# Patient Record
Sex: Male | Born: 1950 | Race: Black or African American | Hispanic: No | Marital: Single | Smoking: Former smoker
Health system: Southern US, Community
[De-identification: ages and names within clinical notes are randomized; demographics above are authoritative.]

## PROBLEM LIST (undated history)

## (undated) DIAGNOSIS — J449 Chronic obstructive pulmonary disease, unspecified: Secondary | ICD-10-CM

## (undated) DIAGNOSIS — I10 Essential (primary) hypertension: Secondary | ICD-10-CM

## (undated) DIAGNOSIS — I428 Other cardiomyopathies: Secondary | ICD-10-CM

## (undated) DIAGNOSIS — I639 Cerebral infarction, unspecified: Secondary | ICD-10-CM

## (undated) DIAGNOSIS — M1990 Unspecified osteoarthritis, unspecified site: Secondary | ICD-10-CM

## (undated) DIAGNOSIS — B20 Human immunodeficiency virus [HIV] disease: Secondary | ICD-10-CM

## (undated) DIAGNOSIS — R200 Anesthesia of skin: Secondary | ICD-10-CM

## (undated) DIAGNOSIS — Z974 Presence of external hearing-aid: Secondary | ICD-10-CM

## (undated) DIAGNOSIS — F1021 Alcohol dependence, in remission: Secondary | ICD-10-CM

## (undated) DIAGNOSIS — R05 Cough: Secondary | ICD-10-CM

## (undated) DIAGNOSIS — G510 Bell's palsy: Secondary | ICD-10-CM

## (undated) DIAGNOSIS — N184 Chronic kidney disease, stage 4 (severe): Secondary | ICD-10-CM

## (undated) DIAGNOSIS — E1122 Type 2 diabetes mellitus with diabetic chronic kidney disease: Secondary | ICD-10-CM

## (undated) DIAGNOSIS — I351 Nonrheumatic aortic (valve) insufficiency: Secondary | ICD-10-CM

## (undated) DIAGNOSIS — I472 Ventricular tachycardia: Secondary | ICD-10-CM

## (undated) DIAGNOSIS — Z9181 History of falling: Secondary | ICD-10-CM

## (undated) DIAGNOSIS — E119 Type 2 diabetes mellitus without complications: Secondary | ICD-10-CM

## (undated) DIAGNOSIS — Z95810 Presence of automatic (implantable) cardiac defibrillator: Secondary | ICD-10-CM

## (undated) DIAGNOSIS — I5022 Chronic systolic (congestive) heart failure: Secondary | ICD-10-CM

## (undated) DIAGNOSIS — E785 Hyperlipidemia, unspecified: Secondary | ICD-10-CM

## (undated) DIAGNOSIS — Z9114 Patient's other noncompliance with medication regimen: Secondary | ICD-10-CM

## (undated) DIAGNOSIS — D649 Anemia, unspecified: Secondary | ICD-10-CM

## (undated) DIAGNOSIS — Z91148 Patient's other noncompliance with medication regimen for other reason: Secondary | ICD-10-CM

## (undated) DIAGNOSIS — Z953 Presence of xenogenic heart valve: Secondary | ICD-10-CM

## (undated) DIAGNOSIS — G4733 Obstructive sleep apnea (adult) (pediatric): Secondary | ICD-10-CM

## (undated) DIAGNOSIS — I4729 Other ventricular tachycardia: Secondary | ICD-10-CM

## (undated) HISTORY — DX: Cough: R05

## (undated) HISTORY — DX: History of falling: Z91.81

## (undated) HISTORY — DX: Anesthesia of skin: R20.0

## (undated) HISTORY — PX: CARDIAC SURGERY: SHX584

## (undated) HISTORY — DX: Presence of external hearing-aid: Z97.4

## (undated) HISTORY — DX: Chronic obstructive pulmonary disease, unspecified: J44.9

---
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
## 2012-05-30 DIAGNOSIS — Z974 Presence of external hearing-aid: Secondary | ICD-10-CM

## 2012-05-30 HISTORY — DX: Presence of external hearing-aid: Z97.4

## 2012-11-12 HISTORY — PX: CARDIAC DEFIBRILLATOR PLACEMENT: SHX171

## 2013-01-28 DIAGNOSIS — I639 Cerebral infarction, unspecified: Secondary | ICD-10-CM

## 2013-01-28 HISTORY — DX: Cerebral infarction, unspecified: I63.9

## 2013-06-07 LAB — PROTIME-INR

## 2013-08-28 DIAGNOSIS — R058 Other specified cough: Secondary | ICD-10-CM

## 2013-08-28 HISTORY — DX: Other specified cough: R05.8

## 2013-10-23 ENCOUNTER — Inpatient Hospital Stay (HOSPITAL_COMMUNITY)
Admission: EM | Admit: 2013-10-23 | Discharge: 2013-10-28 | DRG: 292 | Disposition: A | Discharge status: Nursing Home (Includes ICF) | Payer: Medicare HMO | Attending: Pulmonary Disease | Admitting: Pulmonary Disease

## 2013-10-23 ENCOUNTER — Encounter (HOSPITAL_COMMUNITY): Payer: Self-pay | Admitting: Emergency Medicine

## 2013-10-23 ENCOUNTER — Emergency Department (HOSPITAL_COMMUNITY): Payer: Medicare HMO

## 2013-10-23 DIAGNOSIS — Z952 Presence of prosthetic heart valve: Secondary | ICD-10-CM

## 2013-10-23 DIAGNOSIS — I5023 Acute on chronic systolic (congestive) heart failure: Principal | ICD-10-CM

## 2013-10-23 DIAGNOSIS — E785 Hyperlipidemia, unspecified: Secondary | ICD-10-CM | POA: Diagnosis present

## 2013-10-23 DIAGNOSIS — Z9119 Patient's noncompliance with other medical treatment and regimen: Secondary | ICD-10-CM

## 2013-10-23 DIAGNOSIS — T50995A Adverse effect of other drugs, medicaments and biological substances, initial encounter: Secondary | ICD-10-CM | POA: Diagnosis present

## 2013-10-23 DIAGNOSIS — J449 Chronic obstructive pulmonary disease, unspecified: Secondary | ICD-10-CM | POA: Diagnosis present

## 2013-10-23 DIAGNOSIS — D509 Iron deficiency anemia, unspecified: Secondary | ICD-10-CM | POA: Diagnosis present

## 2013-10-23 DIAGNOSIS — Z87891 Personal history of nicotine dependence: Secondary | ICD-10-CM

## 2013-10-23 DIAGNOSIS — I10 Essential (primary) hypertension: Secondary | ICD-10-CM | POA: Diagnosis present

## 2013-10-23 DIAGNOSIS — D72819 Decreased white blood cell count, unspecified: Secondary | ICD-10-CM | POA: Diagnosis present

## 2013-10-23 DIAGNOSIS — I2510 Atherosclerotic heart disease of native coronary artery without angina pectoris: Secondary | ICD-10-CM | POA: Diagnosis present

## 2013-10-23 DIAGNOSIS — N179 Acute kidney failure, unspecified: Secondary | ICD-10-CM | POA: Diagnosis present

## 2013-10-23 DIAGNOSIS — Z95810 Presence of automatic (implantable) cardiac defibrillator: Secondary | ICD-10-CM

## 2013-10-23 DIAGNOSIS — I129 Hypertensive chronic kidney disease with stage 1 through stage 4 chronic kidney disease, or unspecified chronic kidney disease: Secondary | ICD-10-CM | POA: Diagnosis present

## 2013-10-23 DIAGNOSIS — N039 Chronic nephritic syndrome with unspecified morphologic changes: Secondary | ICD-10-CM

## 2013-10-23 DIAGNOSIS — I428 Other cardiomyopathies: Secondary | ICD-10-CM | POA: Diagnosis present

## 2013-10-23 DIAGNOSIS — Z91199 Patient's noncompliance with other medical treatment and regimen due to unspecified reason: Secondary | ICD-10-CM

## 2013-10-23 DIAGNOSIS — I509 Heart failure, unspecified: Secondary | ICD-10-CM | POA: Diagnosis present

## 2013-10-23 DIAGNOSIS — Z7982 Long term (current) use of aspirin: Secondary | ICD-10-CM

## 2013-10-23 DIAGNOSIS — Z8673 Personal history of transient ischemic attack (TIA), and cerebral infarction without residual deficits: Secondary | ICD-10-CM

## 2013-10-23 DIAGNOSIS — J4489 Other specified chronic obstructive pulmonary disease: Secondary | ICD-10-CM | POA: Diagnosis present

## 2013-10-23 DIAGNOSIS — I4891 Unspecified atrial fibrillation: Secondary | ICD-10-CM | POA: Diagnosis present

## 2013-10-23 DIAGNOSIS — E875 Hyperkalemia: Secondary | ICD-10-CM | POA: Diagnosis present

## 2013-10-23 DIAGNOSIS — N183 Chronic kidney disease, stage 3 unspecified: Secondary | ICD-10-CM

## 2013-10-23 DIAGNOSIS — Z7902 Long term (current) use of antithrombotics/antiplatelets: Secondary | ICD-10-CM

## 2013-10-23 DIAGNOSIS — Z79899 Other long term (current) drug therapy: Secondary | ICD-10-CM

## 2013-10-23 DIAGNOSIS — R05 Cough: Secondary | ICD-10-CM

## 2013-10-23 DIAGNOSIS — E119 Type 2 diabetes mellitus without complications: Secondary | ICD-10-CM | POA: Diagnosis present

## 2013-10-23 DIAGNOSIS — N189 Chronic kidney disease, unspecified: Secondary | ICD-10-CM

## 2013-10-23 DIAGNOSIS — R059 Cough, unspecified: Secondary | ICD-10-CM | POA: Diagnosis present

## 2013-10-23 DIAGNOSIS — D631 Anemia in chronic kidney disease: Secondary | ICD-10-CM | POA: Diagnosis present

## 2013-10-23 DIAGNOSIS — F1021 Alcohol dependence, in remission: Secondary | ICD-10-CM | POA: Diagnosis present

## 2013-10-23 HISTORY — DX: Chronic systolic (congestive) heart failure: I50.22

## 2013-10-23 HISTORY — DX: Patient's other noncompliance with medication regimen for other reason: Z91.148

## 2013-10-23 HISTORY — DX: Cerebral infarction, unspecified: I63.9

## 2013-10-23 HISTORY — DX: Essential (primary) hypertension: I10

## 2013-10-23 HISTORY — DX: Ventricular tachycardia: I47.2

## 2013-10-23 HISTORY — DX: Anemia, unspecified: D64.9

## 2013-10-23 HISTORY — DX: Nonrheumatic aortic (valve) insufficiency: I35.1

## 2013-10-23 HISTORY — DX: Alcohol dependence, in remission: F10.21

## 2013-10-23 HISTORY — DX: Type 2 diabetes mellitus without complications: E11.9

## 2013-10-23 HISTORY — DX: Bell's palsy: G51.0

## 2013-10-23 HISTORY — DX: Hyperlipidemia, unspecified: E78.5

## 2013-10-23 HISTORY — DX: Other ventricular tachycardia: I47.29

## 2013-10-23 HISTORY — DX: Patient's other noncompliance with medication regimen: Z91.14

## 2013-10-23 LAB — GLUCOSE, CAPILLARY: GLUCOSE-CAPILLARY: 115 mg/dL — AB (ref 70–99)

## 2013-10-23 LAB — BASIC METABOLIC PANEL
BUN: 60 mg/dL — ABNORMAL HIGH (ref 6–23)
CO2: 21 mEq/L (ref 19–32)
Calcium: 9 mg/dL (ref 8.4–10.5)
Chloride: 105 mEq/L (ref 96–112)
Creatinine, Ser: 1.89 mg/dL — ABNORMAL HIGH (ref 0.50–1.35)
GFR calc Af Amer: 43 mL/min — ABNORMAL LOW (ref >90)
GFR, EST NON AFRICAN AMERICAN: 37 mL/min — AB (ref >90)
Glucose, Bld: 127 mg/dL — ABNORMAL HIGH (ref 70–99)
Potassium: 5.6 mEq/L — ABNORMAL HIGH (ref 3.7–5.3)
SODIUM: 137 meq/L (ref 137–147)

## 2013-10-23 LAB — URINALYSIS, ROUTINE W REFLEX MICROSCOPIC
BILIRUBIN URINE: NEGATIVE
Glucose, UA: NEGATIVE mg/dL
Ketones, ur: NEGATIVE mg/dL
Leukocytes, UA: NEGATIVE
Nitrite: NEGATIVE
Protein, ur: 100 mg/dL — AB
SPECIFIC GRAVITY, URINE: 1.025 (ref 1.005–1.030)
UROBILINOGEN UA: 0.2 mg/dL (ref 0.0–1.0)
pH: 5.5 (ref 5.0–8.0)

## 2013-10-23 LAB — HEPATIC FUNCTION PANEL
ALK PHOS: 265 U/L — AB (ref 39–117)
ALT: 46 U/L (ref 0–53)
AST: 49 U/L — ABNORMAL HIGH (ref 0–37)
Albumin: 2.5 g/dL — ABNORMAL LOW (ref 3.5–5.2)
BILIRUBIN INDIRECT: 0.5 mg/dL (ref 0.3–0.9)
Bilirubin, Direct: 0.5 mg/dL — ABNORMAL HIGH (ref 0.0–0.3)
Total Bilirubin: 1 mg/dL (ref 0.3–1.2)
Total Protein: 8.6 g/dL — ABNORMAL HIGH (ref 6.0–8.3)

## 2013-10-23 LAB — CBC
HCT: 29.8 % — ABNORMAL LOW (ref 39.0–52.0)
HEMOGLOBIN: 9.2 g/dL — AB (ref 13.0–17.0)
MCH: 27.1 pg (ref 26.0–34.0)
MCHC: 30.9 g/dL (ref 30.0–36.0)
MCV: 87.9 fL (ref 78.0–100.0)
Platelets: 264 10*3/uL (ref 150–400)
RBC: 3.39 MIL/uL — ABNORMAL LOW (ref 4.22–5.81)
RDW: 16.7 % — ABNORMAL HIGH (ref 11.5–15.5)
WBC: 2.6 10*3/uL — ABNORMAL LOW (ref 4.0–10.5)

## 2013-10-23 LAB — URINE MICROSCOPIC-ADD ON

## 2013-10-23 LAB — PRO B NATRIURETIC PEPTIDE: Pro B Natriuretic peptide (BNP): 42136 pg/mL — ABNORMAL HIGH (ref 0–125)

## 2013-10-23 LAB — CBG MONITORING, ED: GLUCOSE-CAPILLARY: 87 mg/dL (ref 70–99)

## 2013-10-23 LAB — TROPONIN I

## 2013-10-23 MED ORDER — TRAMADOL HCL 50 MG PO TABS: 50.0000 mg | ORAL_TABLET | Freq: Four times a day (QID) | ORAL | Status: DC | PRN

## 2013-10-23 MED ORDER — ONDANSETRON HCL 4 MG/2ML IJ SOLN: 4.0000 mg | Freq: Four times a day (QID) | INTRAMUSCULAR | Status: DC | PRN

## 2013-10-23 MED ORDER — FUROSEMIDE 10 MG/ML IJ SOLN
40.0000 mg | Freq: Once | INTRAMUSCULAR | Status: AC
  Administered 2013-10-23: 40 mg via INTRAVENOUS
  Filled 2013-10-23: qty 4

## 2013-10-23 MED ORDER — PANTOPRAZOLE SODIUM 40 MG PO TBEC
40.0000 mg | DELAYED_RELEASE_TABLET | Freq: Every day | ORAL | Status: DC
  Administered 2013-10-23 – 2013-10-28 (×6): 40 mg via ORAL
  Filled 2013-10-23 (×6): qty 1

## 2013-10-23 MED ORDER — ATORVASTATIN CALCIUM 40 MG PO TABS
80.0000 mg | ORAL_TABLET | Freq: Every day | ORAL | Status: DC
  Administered 2013-10-24 – 2013-10-28 (×5): 80 mg via ORAL
  Filled 2013-10-23 (×5): qty 2

## 2013-10-23 MED ORDER — ACETAMINOPHEN 325 MG PO TABS: 650.0000 mg | ORAL_TABLET | Freq: Four times a day (QID) | ORAL | Status: DC | PRN

## 2013-10-23 MED ORDER — FERROUS SULFATE 325 (65 FE) MG PO TABS
325.0000 mg | ORAL_TABLET | Freq: Every day | ORAL | Status: DC
  Administered 2013-10-24 – 2013-10-26 (×3): 325 mg via ORAL
  Filled 2013-10-23 (×3): qty 1

## 2013-10-23 MED ORDER — CLOPIDOGREL BISULFATE 75 MG PO TABS
75.0000 mg | ORAL_TABLET | Freq: Every day | ORAL | Status: DC
  Administered 2013-10-24 – 2013-10-28 (×5): 75 mg via ORAL
  Filled 2013-10-23 (×5): qty 1

## 2013-10-23 MED ORDER — INSULIN ASPART 100 UNIT/ML ~~LOC~~ SOLN
0.0000 [IU] | Freq: Every day | SUBCUTANEOUS | Status: DC
Start: 2013-10-23 — End: 2013-10-28

## 2013-10-23 MED ORDER — ASPIRIN EC 325 MG PO TBEC
325.0000 mg | DELAYED_RELEASE_TABLET | Freq: Every day | ORAL | Status: DC
  Administered 2013-10-24 – 2013-10-28 (×5): 325 mg via ORAL
  Filled 2013-10-23 (×5): qty 1

## 2013-10-23 MED ORDER — GUAIFENESIN-CODEINE 100-10 MG/5ML PO SOLN
5.0000 mL | ORAL | Status: DC | PRN
  Administered 2013-10-23 – 2013-10-24 (×3): 5 mL via ORAL
  Filled 2013-10-23 (×3): qty 5

## 2013-10-23 MED ORDER — CLONIDINE HCL 0.2 MG/24HR TD PTWK
0.2000 mg | MEDICATED_PATCH | TRANSDERMAL | Status: DC
  Filled 2013-10-23: qty 1

## 2013-10-23 MED ORDER — ONDANSETRON HCL 4 MG PO TABS: 4.0000 mg | ORAL_TABLET | Freq: Four times a day (QID) | ORAL | Status: DC | PRN

## 2013-10-23 MED ORDER — INSULIN ASPART 100 UNIT/ML ~~LOC~~ SOLN
0.0000 [IU] | Freq: Three times a day (TID) | SUBCUTANEOUS | Status: DC
Start: 2013-10-24 — End: 2013-10-28
  Administered 2013-10-25 (×2): 3 [IU] via SUBCUTANEOUS
  Administered 2013-10-26: 2 [IU] via SUBCUTANEOUS
  Administered 2013-10-26 – 2013-10-27 (×3): 3 [IU] via SUBCUTANEOUS

## 2013-10-23 MED ORDER — LEVOFLOXACIN 500 MG PO TABS
500.0000 mg | ORAL_TABLET | ORAL | Status: DC
  Administered 2013-10-23 – 2013-10-27 (×3): 500 mg via ORAL
  Filled 2013-10-23 (×3): qty 1

## 2013-10-23 MED ORDER — CARVEDILOL 12.5 MG PO TABS
25.0000 mg | ORAL_TABLET | Freq: Two times a day (BID) | ORAL | Status: DC
  Administered 2013-10-24 – 2013-10-28 (×9): 25 mg via ORAL
  Filled 2013-10-23 (×9): qty 2

## 2013-10-23 MED ORDER — LEVOFLOXACIN 500 MG PO TABS: 500.0000 mg | ORAL_TABLET | ORAL | Status: DC

## 2013-10-23 MED ORDER — HEPARIN SODIUM (PORCINE) 5000 UNIT/ML IJ SOLN
5000.0000 [IU] | Freq: Three times a day (TID) | INTRAMUSCULAR | Status: DC
  Administered 2013-10-23 – 2013-10-28 (×14): 5000 [IU] via SUBCUTANEOUS
  Filled 2013-10-23 (×14): qty 1

## 2013-10-23 MED ORDER — FUROSEMIDE 80 MG PO TABS
80.0000 mg | ORAL_TABLET | Freq: Two times a day (BID) | ORAL | Status: DC
  Administered 2013-10-24: 80 mg via ORAL
  Filled 2013-10-23: qty 1

## 2013-10-23 MED ORDER — ACETAMINOPHEN 650 MG RE SUPP: 650.0000 mg | Freq: Four times a day (QID) | RECTAL | Status: DC | PRN

## 2013-10-23 MED ORDER — NITROGLYCERIN 2 % TD OINT
1.0000 [in_us] | TOPICAL_OINTMENT | Freq: Once | TRANSDERMAL | Status: AC
  Administered 2013-10-23: 1 [in_us] via TOPICAL
  Filled 2013-10-23: qty 1

## 2013-10-23 MED ORDER — PNEUMOCOCCAL VAC POLYVALENT 25 MCG/0.5ML IJ INJ
0.5000 mL | INJECTION | INTRAMUSCULAR | Status: DC
  Filled 2013-10-23: qty 0.5

## 2013-10-23 NOTE — H&P (Addendum)
[facility] History and Physical  Inez Sayles [id number] DOB: Jun 15, 1950 DOA: 10/23/2013  Referring physician: Dr. Roderic Palau PCP: Dr Luan Pulling  Chief Complaint:  Persistent cough for 3 days  HPI:  63 year old male with history of CHF with EF of 20-25% with NYHA class III dyspnea, status post AICD, hypertension, type 2 diabetes mellitus, CAD, CKD with baseline creatinine of 1.6-1.8,  history of CVA with residual mild weakness ,who was recently hospitalized at [facility] in [place] for acute on chronic systolic CHF presented to the ED with persistent dry cough for past 3 days. Patient previously lived in [place] and after being discharged from the hospital 10 days back to [place]  to live with his family. Patient during recent hospitalization was treated with IV Lasix for acute CHF. He apparently also had elevated d-dimer and had VQ scan done which I assume was negative. Patient was discharged on full dose of aspirin and increased her dose of Lasix. He reports having established care with Dr. Luan Pulling as his primary care (has not had outpatient visit yet) and we cardiology at [facility]. He also reports establishing care with Dr. Hinda Lenis for his kidney disease . Patient denies any phlegm or hemoptysis. He denies any sick contacts. On his discharge medication list from the hospital he was prescribed a course of azithromycin as well. He denies any worsening shortness of breath since his recent hospitalization. He reports that his leg swelling have however reduced . At baseline he has in light AHA class III dyspnea and has 2 pillow  orthopnea. Patient denies headache, dizziness, fever, chills, nausea , vomiting, chest pain, palpitations, SOB, abdominal pain, bowel or urinary symptoms. Denies change in weight or appetite.  Course in the ED Patient's vitals were stable. Blood work done for WBC of 2.6, hemoglobin of 9.2, hematocrit 29.8. Chemistry showed sodium of 137, potassium of 5.6, BUN  of 16 creatinine of 1.89. Troponin was negative. Chest x-ray was negative for infiltrate or pulmonary edema. ProBNP was elevated to 42,000. Patient given one dose of IV Lasix and admitted on observation with concern for CHF.  Review of Systems:  Constitutional: Denies fever, chills, diaphoresis, appetite change and fatigue.  HEENT: Dry Cough and throat congestion, Denies photophobia, eye pain, redness, hearing loss, ear pain, sore throat, rhinorrhea, sneezing, mouth sores, trouble swallowing, neck pain,   Respiratory: Dyspnea on exertion and cough , denies chest tightness,  and wheezing.   Cardiovascular: Denies chest pain, palpitations, leg swelling.  Gastrointestinal: Denies nausea, vomiting, abdominal pain, diarrhea, constipation, blood in stool and abdominal distention.  Genitourinary: Denies dysuria, urgency, frequency, hematuria, flank pain and difficulty urinating.  Endocrine: Denies: hot or cold intolerance,  polyuria, polydipsia. Musculoskeletal: Denies myalgias, back pain, joint swelling, arthralgias and gait problem.  Skin: Denies pallor, rash and wound.  Neurological: Denies dizziness, seizures, syncope, weakness, light-headedness, numbness and headaches.     Past Medical History  Diagnosis Date  . CHF (congestive heart failure)   . Diabetes mellitus without complication   . Coronary artery disease   . Hypertension   . Renal disorder   . Anemia   . Hypokalemia   . Hyperlipidemia   . Stroke     residual facial droop  . Bell's palsy   . Cardiomyopathy   . Aortic insufficiency   . Alcoholism in remission   . Noncompliance with medication regimen   . NSVT (nonsustained ventricular tachycardia)   . Hypoglycemia   . Arrhythmia   . Cardiac defibrillator in situ   . Acute  kidney injury    Past Surgical History  Procedure Laterality Date  . Pacemaker insertion    . Cardiac surgery     Social History:  reports that he has quit smoking. He does not have any smokeless  tobacco history on file. He reports that he does not drink alcohol or use illicit drugs.  No Known Allergies  No family history on file.  Prior to Admission medications   Medication Sig Start Date End Date Taking? Authorizing Provider  aspirin EC 325 MG tablet Take 325 mg by mouth daily.   Yes Historical Provider, MD  atorvastatin (LIPITOR) 80 MG tablet Take 80 mg by mouth daily.   Yes Historical Provider, MD  carvedilol (COREG) 25 MG tablet Take 25 mg by mouth 2 (two) times daily with a meal.   Yes Historical Provider, MD  cloNIDine (CATAPRES - DOSED IN MG/24 HR) 0.2 mg/24hr patch Place 0.2 mg onto the skin once a week.   Yes Historical Provider, MD  clopidogrel (PLAVIX) 75 MG tablet Take 75 mg by mouth daily with breakfast.   Yes Historical Provider, MD  ferrous sulfate 325 (65 FE) MG tablet Take 325 mg by mouth daily with breakfast.   Yes Historical Provider, MD  furosemide (LASIX) 80 MG tablet Take 80 mg by mouth 2 (two) times daily.   Yes Historical Provider, MD  insulin lispro (HUMALOG) 100 UNIT/ML injection Inject 2-6 Units into the skin 3 (three) times daily with meals.   Yes Historical Provider, MD  lisinopril (PRINIVIL,ZESTRIL) 20 MG tablet Take 20 mg by mouth daily.   Yes Historical Provider, MD  spironolactone (ALDACTONE) 25 MG tablet Take 25 mg by mouth daily.   Yes Historical Provider, MD  traMADol (ULTRAM) 50 MG tablet Take 50 mg by mouth every 6 (six) hours as needed for moderate pain.   Yes Historical Provider, MD     Physical Exam:  Filed Vitals:   10/23/13 1805 10/23/13 1828 10/23/13 1843 10/23/13 1900  BP:  138/89 131/83   Pulse: 70 62 72   Temp:   98.2 F (36.8 C)   TempSrc:   Oral   Resp: 23     Height:   5\' 2"  (1.575 m)   Weight:    63.277 kg (139 lb 8 oz)  SpO2: 100% 96% 98%     Constitutional: Vital signs reviewed.  Patient is a well-developed and well-nourished in no acute distress. HEENT: no pallor, no icterus, moist oral mucosa, left facial  droop Cardiovascular: RRR, S1 normal, S2 normal, no MRG Chest: CTAB, no wheezes, rales, or rhonchi Abdominal: Soft. Non-tender, non-distended, bowel sounds are normal, no masses, organomegaly, or guarding present.  Ext: warm, trace edema Neurological: A&O x3, non focal  Labs on Admission:  Basic Metabolic Panel:  Recent Labs Lab 10/23/13 1502  NA 137  K 5.6*  CL 105  CO2 21  GLUCOSE 127*  BUN 60*  CREATININE 1.89*  CALCIUM 9.0   Liver Function Tests:  Recent Labs Lab 10/23/13 1502  AST 49*  ALT 46  ALKPHOS 265*  BILITOT 1.0  PROT 8.6*  ALBUMIN 2.5*   No results found for this basename: LIPASE, AMYLASE,  in the last 168 hours No results found for this basename: AMMONIA,  in the last 168 hours CBC:  Recent Labs Lab 10/23/13 1502  WBC 2.6*  HGB 9.2*  HCT 29.8*  MCV 87.9  PLT 264   Cardiac Enzymes:  Recent Labs Lab 10/23/13 1502  TROPONINI <0.30  BNP: No components found with this basename: POCBNP,  CBG:  Recent Labs Lab 10/23/13 1300  GLUCAP 87    Radiological Exams on Admission: Dg Chest 2 View  10/23/2013   CLINICAL DATA:  Cough for 2 months. Resident of the adult family care home.  EXAM: CHEST  2 VIEW  COMPARISON:  None.  FINDINGS: Left subclavian pacemaker lead is present at the right ventricular apex. The heart is mildly enlarged status post median sternotomy. The lungs demonstrate mild interstitial prominence and central airway thickening. There is no confluent airspace opacity or definite edema. There is mild fissural thickening without pleural effusion.  IMPRESSION: The cardiomegaly with interstitial prominence and central airway thickening. No overt pulmonary edema or confluent airspace opacity.   Electronically Signed   By: Camie Patience M.D.   On: 10/23/2013 13:42    EKG:   Assessment/Plan Principal Problem:   Cough Possibly acute bronchitis vs secondary to ACEi Will place on empiric Levaquin. Hold lisinopril. Place on guaifensin and  codein prn. Add albuterol inhaler prn.  possible underlying GERD. Add PPI. - place on empiric Levaquin with concern for bronchitis.  Active Problems:   CHF (congestive heart failure), NYHA class III Appears to be stable. No signs of acute failure. Patient may have chronically elevated proBNP. Was 55,000 when he was admitted 2 weeks back for acute CHF. Continue home dose Lasix, aspirin, and carvedilol. Hold ACE inhibitor and Aldactone given hyperkalemia.    CKD (chronic kidney disease) Baseline creatinine appears to be about 1.6 and mildly deviated possibly from increased dose of Lasix. Continue to monitor    Type II or unspecified type diabetes mellitus , controlled A1c of 8.1, 2 months back. Patient on pre-meal aspart. We'll place on sliding scale insulin    Essential hypertension, benign Continue home medications. Will hold lisinopril and Aldactone    Hyperkalemia Hold lisinopril and Aldactone. Hold potassium supplement .No EKG changes. Monitor in a.m.    CAD (coronary artery disease) Continue  aspirin, statin and carvedilol. Patient has followup with cardiology he needed repeated    History of stroke Has mild residual weakness. Has left facial likely due to chronic bell's palsy. Continue Plavix and a statin    Anemia of chronic kidney failure Appears to be stable. Monitor. Noted for low WBC as well. Monitor labs in a.m. Continue iron sulfate   Diabetes mellitus  monitor fsg. Place on SSI  Diet:cardiac / diabetic  DVT prophylaxis: sq heparin   Code Status: full code Family Communication: discussed with brother in law None at bedside Disposition Plan: home once improved  Abbie Jablon [facility] Pager [phone]  Total time spent on admission :50 minutes  If 7PM-7AM, please contact night-coverage [email or web address] Password [facility] 10/23/2013, 7:38 PM

## 2013-10-23 NOTE — ED Notes (Signed)
Pt eating some peanutbutter crackers to prevent blood sugar from dropping.

## 2013-10-23 NOTE — ED Notes (Signed)
Pt reports cough x 2 months. Pt resident of an adult family care home.  Reports has been with [facility] x 1 week.   Unknown if has had fever.

## 2013-10-23 NOTE — ED Notes (Signed)
Pt in restroom 

## 2013-10-23 NOTE — ED Provider Notes (Signed)
CSN: [id number]     Arrival date & time 10/23/13  1230 History   First MD Initiated Contact with Patient 10/23/13 1417     Chief Complaint  Patient presents with  . Cough     (Consider location/radiation/quality/duration/timing/severity/associated sxs/prior Treatment) HPI Comments: Keith Young is a 63 -year-old male who presents to the emergency department with complaint of cough. The patient is a resident of a local rest home facility for the past 10 days. The patient was recently discharged from the [facility] in [place]. The caregiver for the adult family care on home states that the patient has been having problems with cough for approximately 2 months, but worse in the past 3-4 days. He cannot lie down flat because of problems with cough and difficulty with breathing. It is of note that while at the [facility] the patient was noted to have acute on chronic combined systolic and diastolic congestive heart failure, diabetes mellitus type 2, hypertension, coronary artery disease, chronic kidney disease stage III, hypokalemia, and facial droop do to an older stroke. The patient's caregiver states that in the past the patient has had problems with alcoholism and possibly substance abuse. The family care home has not been doing daily weights and so we do not have that information available at this time. The patient denies any unusual chest pain, and there's been no loss of consciousness reported. The caregiver was concerned for the constant cough. The patient is being a stab with a primary care physician, but he cannot see this physician until July. He is being established with a cardiologist which he is scheduled to see in 2 days. Patient is also being established with a nephrology specialist.  Patient is a 63 y.o. male presenting with cough. The history is provided by the patient.  Cough Associated symptoms: shortness of breath   Associated symptoms: no chest  pain, no eye discharge and no wheezing     Past Medical History  Diagnosis Date  . CHF (congestive heart failure)   . Diabetes mellitus without complication   . Coronary artery disease   . Hypertension   . Renal disorder   . Anemia   . Hypokalemia   . Hyperlipidemia   . Stroke     residual facial droop  . Bell's palsy   . Cardiomyopathy   . Aortic insufficiency   . Alcoholism in remission   . Noncompliance with medication regimen   . NSVT (nonsustained ventricular tachycardia)   . Hypoglycemia   . Arrhythmia   . Cardiac defibrillator in situ   . Acute kidney injury    Past Surgical History  Procedure Laterality Date  . Pacemaker insertion    . Cardiac surgery     No family history on file. History  Substance Use Topics  . Smoking status: Former Research scientist (life sciences)  . Smokeless tobacco: Not on file  . Alcohol Use: Not on file     Comment: former    Review of Systems  Constitutional: Negative for activity change.       All ROS Neg except as noted in HPI  HENT:       Difficulty hearing  Eyes: Negative for photophobia and discharge.  Respiratory: Positive for cough and shortness of breath. Negative for wheezing.   Cardiovascular: Positive for palpitations and leg swelling. Negative for chest pain.  Gastrointestinal: Negative for abdominal pain and blood in stool.  Genitourinary: Negative for dysuria, frequency and hematuria.  Musculoskeletal: Positive for arthralgias. Negative for back  pain and neck pain.  Skin: Negative.   Allergic/Immunologic: Negative.   Neurological: Positive for weakness. Negative for dizziness, seizures and speech difficulty.  Hematological: Negative.       Allergies  Review of patient's allergies indicates no known allergies.  Home Medications   Prior to Admission medications   Medication Sig Start Date End Date Taking? Authorizing Provider  aspirin EC 325 MG tablet Take 325 mg by mouth daily.   Yes Historical Provider, MD  atorvastatin  (LIPITOR) 80 MG tablet Take 80 mg by mouth daily.   Yes Historical Provider, MD  carvedilol (COREG) 25 MG tablet Take 25 mg by mouth 2 (two) times daily with a meal.   Yes Historical Provider, MD  cloNIDine (CATAPRES - DOSED IN MG/24 HR) 0.2 mg/24hr patch Place 0.2 mg onto the skin once a week.   Yes Historical Provider, MD  clopidogrel (PLAVIX) 75 MG tablet Take 75 mg by mouth daily with breakfast.   Yes Historical Provider, MD  ferrous sulfate 325 (65 FE) MG tablet Take 325 mg by mouth daily with breakfast.   Yes Historical Provider, MD  furosemide (LASIX) 80 MG tablet Take 80 mg by mouth 2 (two) times daily.   Yes Historical Provider, MD  insulin lispro (HUMALOG) 100 UNIT/ML injection Inject 2-6 Units into the skin 3 (three) times daily with meals.   Yes Historical Provider, MD  lisinopril (PRINIVIL,ZESTRIL) 20 MG tablet Take 20 mg by mouth daily.   Yes Historical Provider, MD  spironolactone (ALDACTONE) 25 MG tablet Take 25 mg by mouth daily.   Yes Historical Provider, MD  traMADol (ULTRAM) 50 MG tablet Take 50 mg by mouth every 6 (six) hours as needed for moderate pain.   Yes Historical Provider, MD   BP 119/71  Pulse 63  Temp(Src) 97.4 F (36.3 C) (Oral)  Resp 18  SpO2 99% Physical Exam  Nursing note and vitals reviewed. Constitutional: He is oriented to person, place, and time. He appears well-developed and well-nourished.  Non-toxic appearance.  HENT:  Head: Normocephalic.  Right Ear: Tympanic membrane normal.  Left Ear: Tympanic membrane normal.  Mouth/Throat: Oropharynx is clear and moist.  Hearing aids in place  Eyes: EOM and lids are normal. Pupils are equal, round, and reactive to light.  Neck: Normal range of motion. Neck supple. Carotid bruit is not present. No tracheal deviation present.  Cardiovascular: Normal rate, regular rhythm, intact distal pulses and normal pulses.   Murmur heard. Pulmonary/Chest: No respiratory distress.  A course sounds present with a few  scattered rhonchi.  Abdominal: Soft. Bowel sounds are normal. There is no tenderness. There is no guarding.  Hepatomegaly.  Musculoskeletal: Normal range of motion. He exhibits edema.  2+ pitting edema from the ankle to the anterior tibial tuberosity.  Lymphadenopathy:       Head (right side): No submandibular adenopathy present.       Head (left side): No submandibular adenopathy present.    He has no cervical adenopathy.  Neurological: He is alert and oriented to person, place, and time. He has normal strength. No cranial nerve deficit or sensory deficit.  Skin: Skin is warm and dry.  Psychiatric: He has a normal mood and affect. His speech is normal.    ED Course  Procedures (including critical care time) Labs Review Labs Reviewed  CBG MONITORING, ED  CBG MONITORING, ED    Imaging Review Dg Chest 2 View  10/23/2013   CLINICAL DATA:  Cough for 2 months. Resident of the  adult family care home.  EXAM: CHEST  2 VIEW  COMPARISON:  None.  FINDINGS: Left subclavian pacemaker lead is present at the right ventricular apex. The heart is mildly enlarged status post median sternotomy. The lungs demonstrate mild interstitial prominence and central airway thickening. There is no confluent airspace opacity or definite edema. There is mild fissural thickening without pleural effusion.  IMPRESSION: The cardiomegaly with interstitial prominence and central airway thickening. No overt pulmonary edema or confluent airspace opacity.   Electronically Signed   By: Camie Patience M.D.   On: 10/23/2013 13:42     EKG Interpretation None      MDM Patient seen with me by Dr. Hunt Oris.  Urinalysis shows a clear yellow specimen with a specific gravity of 1.025. The patient has 100 mg per decaliter of protein. Otherwise the urinalysis is within normal limits. A complete blood count shows a low white blood cell count of 2.6, the hemoglobin and hematocrit are low at 9.2 and 29.8 respectively. The basic metabolic  panel shows the potassium to be elevated at 5.6, glucose 127 elevated, the BUN elevated at 60, and a creatinine elevated at 1.89. The glomerular filtration rate is low at 43. Electrocardiogram was negative for acute event. The chest x-ray shows cardiomegaly with interstitial prominence and central airway thickening, no overt pulmonary edema. The B. natruretic peptide is elevated at 42,136, the troponin is within normal limits at less than 0.30.  Call placed to the hospitalist for admission due to to congestive heart failure and chronic kidney disease stage III. Pt to be admitted.   Final diagnoses:  None    *I have reviewed nursing notes, vital signs, and all appropriate lab and imaging results for this patient.Keith Ahr, PA-C 10/23/13 1719

## 2013-10-24 ENCOUNTER — Encounter (HOSPITAL_COMMUNITY): Payer: Self-pay | Admitting: Cardiology

## 2013-10-24 DIAGNOSIS — I369 Nonrheumatic tricuspid valve disorder, unspecified: Secondary | ICD-10-CM

## 2013-10-24 DIAGNOSIS — I5023 Acute on chronic systolic (congestive) heart failure: Principal | ICD-10-CM

## 2013-10-24 DIAGNOSIS — I2510 Atherosclerotic heart disease of native coronary artery without angina pectoris: Secondary | ICD-10-CM

## 2013-10-24 LAB — BASIC METABOLIC PANEL
BUN: 64 mg/dL — ABNORMAL HIGH (ref 6–23)
CO2: 20 mEq/L (ref 19–32)
Calcium: 8.7 mg/dL (ref 8.4–10.5)
Chloride: 101 mEq/L (ref 96–112)
Creatinine, Ser: 1.98 mg/dL — ABNORMAL HIGH (ref 0.50–1.35)
GFR calc non Af Amer: 35 mL/min — ABNORMAL LOW (ref >90)
GFR, EST AFRICAN AMERICAN: 40 mL/min — AB (ref >90)
Glucose, Bld: 113 mg/dL — ABNORMAL HIGH (ref 70–99)
POTASSIUM: 6.2 meq/L — AB (ref 3.7–5.3)
SODIUM: 134 meq/L — AB (ref 137–147)

## 2013-10-24 LAB — GLUCOSE, CAPILLARY
GLUCOSE-CAPILLARY: 116 mg/dL — AB (ref 70–99)
GLUCOSE-CAPILLARY: 107 mg/dL — AB (ref 70–99)
GLUCOSE-CAPILLARY: 115 mg/dL — AB (ref 70–99)
Glucose-Capillary: 101 mg/dL — ABNORMAL HIGH (ref 70–99)
Glucose-Capillary: 132 mg/dL — ABNORMAL HIGH (ref 70–99)

## 2013-10-24 LAB — CBC
HCT: 28.9 % — ABNORMAL LOW (ref 39.0–52.0)
HEMOGLOBIN: 8.9 g/dL — AB (ref 13.0–17.0)
MCH: 26.8 pg (ref 26.0–34.0)
MCHC: 30.8 g/dL (ref 30.0–36.0)
MCV: 87 fL (ref 78.0–100.0)
Platelets: 259 10*3/uL (ref 150–400)
RBC: 3.32 MIL/uL — AB (ref 4.22–5.81)
RDW: 16.6 % — ABNORMAL HIGH (ref 11.5–15.5)
WBC: 3.2 10*3/uL — ABNORMAL LOW (ref 4.0–10.5)

## 2013-10-24 MED ORDER — SODIUM CHLORIDE 0.9 % IV SOLN
INTRAVENOUS | Status: DC
  Administered 2013-10-24 – 2013-10-28 (×5): via INTRAVENOUS

## 2013-10-24 MED ORDER — SODIUM POLYSTYRENE SULFONATE 15 GM/60ML PO SUSP
30.0000 g | ORAL | Status: AC
  Administered 2013-10-24 (×2): 30 g via ORAL
  Filled 2013-10-24 (×2): qty 120

## 2013-10-24 MED ORDER — ALBUTEROL SULFATE (2.5 MG/3ML) 0.083% IN NEBU
2.5000 mg | INHALATION_SOLUTION | Freq: Four times a day (QID) | RESPIRATORY_TRACT | Status: DC
  Administered 2013-10-24 (×3): 2.5 mg via RESPIRATORY_TRACT
  Filled 2013-10-24 (×3): qty 3

## 2013-10-24 MED ORDER — ALBUTEROL SULFATE (2.5 MG/3ML) 0.083% IN NEBU
2.5000 mg | INHALATION_SOLUTION | Freq: Two times a day (BID) | RESPIRATORY_TRACT | Status: DC
  Administered 2013-10-25 – 2013-10-28 (×7): 2.5 mg via RESPIRATORY_TRACT
  Filled 2013-10-24 (×7): qty 3

## 2013-10-24 MED ORDER — BENZONATATE 100 MG PO CAPS
200.0000 mg | ORAL_CAPSULE | Freq: Three times a day (TID) | ORAL | Status: DC | PRN
  Administered 2013-10-24: 200 mg via ORAL
  Filled 2013-10-24: qty 2

## 2013-10-24 MED ORDER — LEVOFLOXACIN IN D5W 500 MG/100ML IV SOLN: 500.0000 mg | INTRAVENOUS | Status: DC

## 2013-10-24 MED ORDER — CLONIDINE HCL 0.2 MG/24HR TD PTWK: 0.2000 mg | MEDICATED_PATCH | TRANSDERMAL | Status: DC

## 2013-10-24 MED ORDER — FUROSEMIDE 10 MG/ML IJ SOLN
40.0000 mg | Freq: Two times a day (BID) | INTRAMUSCULAR | Status: DC
  Administered 2013-10-24 – 2013-10-28 (×9): 40 mg via INTRAVENOUS
  Filled 2013-10-24 (×9): qty 4

## 2013-10-24 MED ORDER — ALBUTEROL SULFATE (2.5 MG/3ML) 0.083% IN NEBU: 2.5000 mg | INHALATION_SOLUTION | RESPIRATORY_TRACT | Status: DC | PRN

## 2013-10-24 NOTE — Clinical Social Work Psychosocial (Signed)
Clinical Social Work Department BRIEF PSYCHOSOCIAL ASSESSMENT 10/24/2013  Patient:  Keith Young,Keith Young     Account Number:  [id number]     Admit date:  10/23/2013  Clinical Social Worker:  CUNNINGHAM,ANNE, CLINICAL SOCIAL WORKER  Date/Time:  10/24/2013 11:00 AM  Referred by:  CSW  Date Referred:  10/24/2013 Referred for  ALF Placement   Other Referral:   Interview type:  Patient Other interview type:   Also spoke w patient's sister in room, she is also staff of [facility] where patient lives    PSYCHOSOCIAL DATA Living Status:  FACILITY Admitted from facility:  Other Level of care:  Assisted Living Primary support name:  [name] Rucker Primary support relationship to patient:  NONE Degree of support available:   Patient has son, Stanford Brown.  Has no cell phone, Rucker will provide son's home phone.  Rucker is NOT legal guardian.  Patient is responsible for himself.    CURRENT CONCERNS Current Concerns  Post-Acute Placement   Other Concerns:    SOCIAL WORK ASSESSMENT / PLAN CSW met w patient at bedside, patient oriented x4.  Patient coughing and has difficulty speaking.  Had several episodes of briefly falling asleep while sitting on bed.  Patient's sister spoke on behalf of patient.    Patient lives at [facility], facility owned by sister and brother.  Patient does not qualify for Medicaid (too much income), is on disability and has Medicare.  Patient lived in [place] in independent apartment, has one son who is currently in prison.  Due to be released in 2 months.  Patient is retired textile worker.    Sister says that she feels patient was released "too early" from [facility] in [place].  [facility] RN saw patient on Tuesday at facility.  Facility is weighing patient daily, concerned that they did not notice an increase in weight but patient still had CHF difficulty. Also noted that patient's blood sugar is "all over the place."  Concerned that  patient's chronic heart failure is not managed well at present.  Patient is now patient of [facility].  Facility hopes that patient can become more stable w his CHF while at their facility.    CSW will continue to monitor, transmitted facility concerns to RN CM who will be arranging home health care needs at discharge.   Assessment/plan status:  Psychosocial Support/Ongoing Assessment of Needs Other assessment/ plan:   Information/referral to community resources:   Return to ALF    PATIENT'S/FAMILY'S RESPONSE TO PLAN OF CARE: Sister expressed concern about need for more help in CHF managment - feels patient is not stabilizing w current level of support.  Patient weak and had difficulty maintaining conversation.        Anne Cunningham, LCSW Clinical Social Worker ([phone])  

## 2013-10-24 NOTE — Consult Note (Signed)
Reason for Consult: Hyperkalemia and renal failure Referring Physician: Dr. Earlyne Iba is an 63 y.o. male.  HPI: He is a patient who has history of chronic renal failure stage III, history of diabetes, history of hypertension presently brought by his caretaker because of continuous hacking, dry cough. Patient has been recently admitted to the hospital because of similar issues. This has been going on for the last 2 months however the last 3-4 days the cough become persistent hence patient was brought here. He also complains of difficulty in. Patien. Breathing _0  he he   denies any chest pain. He denies any nausea no vomiting. His appetite is poor  Past Medical History  Diagnosis Date  . Chronic systolic heart failure   . Type 2 diabetes mellitus   . Coronary atherosclerosis of native coronary artery   . Essential hypertension, benign   . CKD (chronic kidney disease) stage 3, GFR 30-59 ml/min   . Anemia   . Hyperlipidemia   . History of stroke   . Bell's palsy   . Cardiomyopathy   . Aortic insufficiency   . Alcoholism in remission   . Noncompliance with medication regimen   . NSVT (nonsustained ventricular tachycardia)   . Cardiac defibrillator in situ     Past Surgical History  Procedure Laterality Date  . Pacemaker insertion    . Cardiac surgery      Family History  Problem Relation Age of Onset  . Kidney disease Mother   . Kidney disease Sister     Social History:  reports that he has quit smoking. His smoking use included Cigarettes. He smoked 0.00 packs per day. He does not have any smokeless tobacco history on file. He reports that he does not drink alcohol or use illicit drugs.  Allergies: No Known Allergies  Medications: I have reviewed the patient's current medications.  Results for orders placed during the hospital encounter of 10/23/13 (from the past 48 hour(s))  CBG MONITORING, ED     Status: None   Collection Time    10/23/13  1:00 PM       Result Value Ref Range   Glucose-Capillary 87  70 - 99 mg/dL  CBC     Status: Abnormal   Collection Time    10/23/13  3:02 PM      Result Value Ref Range   WBC 2.6 (*) 4.0 - 10.5 K/uL   RBC 3.39 (*) 4.22 - 5.81 MIL/uL   Hemoglobin 9.2 (*) 13.0 - 17.0 g/dL   HCT 29.8 (*) 39.0 - 52.0 %   MCV 87.9  78.0 - 100.0 fL   MCH 27.1  26.0 - 34.0 pg   MCHC 30.9  30.0 - 36.0 g/dL   RDW 16.7 (*) 11.5 - 15.5 %   Platelets 264  150 - 400 K/uL  BASIC METABOLIC PANEL     Status: Abnormal   Collection Time    10/23/13  3:02 PM      Result Value Ref Range   Sodium 137  137 - 147 mEq/L   Potassium 5.6 (*) 3.7 - 5.3 mEq/L   Chloride 105  96 - 112 mEq/L   CO2 21  19 - 32 mEq/L   Glucose, Bld 127 (*) 70 - 99 mg/dL   BUN 60 (*) 6 - 23 mg/dL   Creatinine, Ser 1.89 (*) 0.50 - 1.35 mg/dL   Calcium 9.0  8.4 - 10.5 mg/dL   GFR calc non Af  Amer 37 (*) >90 mL/min   GFR calc Af Amer 43 (*) >90 mL/min   Comment: (NOTE)     The eGFR has been calculated using the CKD EPI equation.     This calculation has not been validated in all clinical situations.     eGFR's persistently <90 mL/min signify possible Chronic Kidney     Disease.  PRO B NATRIURETIC PEPTIDE     Status: Abnormal   Collection Time    10/23/13  3:02 PM      Result Value Ref Range   Pro B Natriuretic peptide (BNP) 42136.0 (*) 0 - 125 pg/mL  TROPONIN I     Status: None   Collection Time    10/23/13  3:02 PM      Result Value Ref Range   Troponin I <0.30  <0.30 ng/mL   Comment:            Due to the release kinetics of cTnI,     a negative result within the first hours     of the onset of symptoms does not rule out     myocardial infarction with certainty.     If myocardial infarction is still suspected,     repeat the test at appropriate intervals.  HEPATIC FUNCTION PANEL     Status: Abnormal   Collection Time    10/23/13  3:02 PM      Result Value Ref Range   Total Protein 8.6 (*) 6.0 - 8.3 g/dL   Albumin 2.5 (*) 3.5 - 5.2 g/dL    AST 49 (*) 0 - 37 U/L   ALT 46  0 - 53 U/L   Alkaline Phosphatase 265 (*) 39 - 117 U/L   Total Bilirubin 1.0  0.3 - 1.2 mg/dL   Bilirubin, Direct 0.5 (*) 0.0 - 0.3 mg/dL   Indirect Bilirubin 0.5  0.3 - 0.9 mg/dL  URINALYSIS, ROUTINE W REFLEX MICROSCOPIC     Status: Abnormal   Collection Time    10/23/13  3:45 PM      Result Value Ref Range   Color, Urine YELLOW  YELLOW   APPearance CLEAR  CLEAR   Specific Gravity, Urine 1.025  1.005 - 1.030   pH 5.5  5.0 - 8.0   Glucose, UA NEGATIVE  NEGATIVE mg/dL   Hgb urine dipstick SMALL (*) NEGATIVE   Bilirubin Urine NEGATIVE  NEGATIVE   Ketones, ur NEGATIVE  NEGATIVE mg/dL   Protein, ur 100 (*) NEGATIVE mg/dL   Urobilinogen, UA 0.2  0.0 - 1.0 mg/dL   Nitrite NEGATIVE  NEGATIVE   Leukocytes, UA NEGATIVE  NEGATIVE  URINE MICROSCOPIC-ADD ON     Status: None   Collection Time    10/23/13  3:45 PM      Result Value Ref Range   Squamous Epithelial / LPF RARE  RARE   WBC, UA 0-2  <3 WBC/hpf   RBC / HPF 3-6  <3 RBC/hpf   Bacteria, UA RARE  RARE  GLUCOSE, CAPILLARY     Status: Abnormal   Collection Time    10/23/13  4:22 PM      Result Value Ref Range   Glucose-Capillary 116 (*) 70 - 99 mg/dL  GLUCOSE, CAPILLARY     Status: Abnormal   Collection Time    10/23/13  8:52 PM      Result Value Ref Range   Glucose-Capillary 115 (*) 70 - 99 mg/dL   Comment 1 Notify RN  Comment 2 Documented in Chart    BASIC METABOLIC PANEL     Status: Abnormal   Collection Time    10/24/13  5:48 AM      Result Value Ref Range   Sodium 134 (*) 137 - 147 mEq/L   Potassium 6.2 (*) 3.7 - 5.3 mEq/L   Chloride 101  96 - 112 mEq/L   CO2 20  19 - 32 mEq/L   Glucose, Bld 113 (*) 70 - 99 mg/dL   BUN 64 (*) 6 - 23 mg/dL   Creatinine, Ser 1.98 (*) 0.50 - 1.35 mg/dL   Calcium 8.7  8.4 - 10.5 mg/dL   GFR calc non Af Amer 35 (*) >90 mL/min   GFR calc Af Amer 40 (*) >90 mL/min   Comment: (NOTE)     The eGFR has been calculated using the CKD EPI equation.      This calculation has not been validated in all clinical situations.     eGFR's persistently <90 mL/min signify possible Chronic Kidney     Disease.  CBC     Status: Abnormal   Collection Time    10/24/13  5:48 AM      Result Value Ref Range   WBC 3.2 (*) 4.0 - 10.5 K/uL   RBC 3.32 (*) 4.22 - 5.81 MIL/uL   Hemoglobin 8.9 (*) 13.0 - 17.0 g/dL   HCT 28.9 (*) 39.0 - 52.0 %   MCV 87.0  78.0 - 100.0 fL   MCH 26.8  26.0 - 34.0 pg   MCHC 30.8  30.0 - 36.0 g/dL   RDW 16.6 (*) 11.5 - 15.5 %   Platelets 259  150 - 400 K/uL  GLUCOSE, CAPILLARY     Status: Abnormal   Collection Time    10/24/13  7:28 AM      Result Value Ref Range   Glucose-Capillary 107 (*) 70 - 99 mg/dL   Comment 1 Notify RN    GLUCOSE, CAPILLARY     Status: Abnormal   Collection Time    10/24/13 11:21 AM      Result Value Ref Range   Glucose-Capillary 101 (*) 70 - 99 mg/dL   Comment 1 Notify RN      Dg Chest 2 View  10/23/2013   CLINICAL DATA:  Cough for 2 months. Resident of the adult family care home.  EXAM: CHEST  2 VIEW  COMPARISON:  None.  FINDINGS: Left subclavian pacemaker lead is present at the right ventricular apex. The heart is mildly enlarged status post median sternotomy. The lungs demonstrate mild interstitial prominence and central airway thickening. There is no confluent airspace opacity or definite edema. There is mild fissural thickening without pleural effusion.  IMPRESSION: The cardiomegaly with interstitial prominence and central airway thickening. No overt pulmonary edema or confluent airspace opacity.   Electronically Signed   By: Camie Patience M.D.   On: 10/23/2013 13:42    Review of Systems  Constitutional: Negative for fever and chills.  Respiratory: Positive for cough and shortness of breath. Negative for hemoptysis and sputum production.   Cardiovascular: Positive for orthopnea. Negative for leg swelling.  Gastrointestinal: Negative for nausea and vomiting.   Blood pressure 130/89, pulse 73,  temperature 98.3 F (36.8 C), temperature source Oral, resp. rate 20, height _0  (1.575 m), weight 64.5 kg (142 lb 3.2 oz), SpO2 97.00%. Physical Exam  Constitutional: No distress.  Eyes: Left eye exhibits no discharge.  Neck: No JVD present.  Cardiovascular: Normal  rate and regular rhythm.   Respiratory: No respiratory distress. He has wheezes.  contineus dry cough  GI: He exhibits no distension.  Musculoskeletal: He exhibits no edema.  Neurological: He is alert.    Assessment/Plan  Problem #1 hyperkalemia: His potassium he is increasing. According to her care taker patient has this moment is not taking anything to give him high potassium according to her his of her spine has a problem with high potassium. Patient presently is not on a potassium supplement. Patient however was on Aldactone and lisinopril which can increase his potassium. Since patient has diabetes possibly type IV RTA may be present also. Problem #2 renal failure: Chronic presently at baseline Problem #3 hypertension his blood pressure is reasonably controlled Problem #4 type 2 diabetes Problem #5 chronic and persistent cough: Etiology not clear but patient also on ACE inhibitor possibly that might have also contributed. Presently patient is off the medication. Problem #6 history of CVA Problem #7 coronary artery disease. Plan: We'll start patient on normal saline at 55 cc per hour We'll continue his Lasix We'll give to Kayexalate 30 g 2 doses. We'll check his basic metabolic panel in the morning.   Gevena Cotton Dawna Jakes 10/24/2013, 12:16 PM

## 2013-10-24 NOTE — Care Management Note (Addendum)
    Page 1 of 2   10/28/2013     11:21:31 AM CARE MANAGEMENT NOTE 10/28/2013  Patient:  Keith Young, Keith Young   Account Number:  [id number]  Date Initiated:  10/24/2013  Documentation initiated by:  Theophilus Kinds  Subjective/Objective Assessment:   Pt admitted from [facility] home. Pt will return to facility at discharge.     Action/Plan:   CSW to arrange discharge to facility when medically stable.   Anticipated DC Date:  10/28/2013   Anticipated DC Plan:  ASSISTED LIVING / [place]  CM consult      PAC Choice  [place]   Choice offered to / List presented to:  C-1 Patient   DME arranged  [facility]      DME agency  [place] arranged  HH-1 RN  [place].   Status of service:  Completed, signed off Medicare Important Message given?  YES (If response is "NO", the following Medicare IM given date fields will be blank) Date Medicare IM given:  10/25/2013 Date Additional Medicare IM given:  10/28/2013  Discharge Disposition:  ASSISTED LIVING  Per UR Regulation:    If discussed at Long Length of Stay Meetings, dates discussed:    Comments:  10/28/13 [place], RN BSN [facility] bed arranged with [facility] as well to be delivered to ALF today.  10/28/13 [place], RN BSN CM Pt discharged back to ALF today with resumption of [facility] (per pts choice) Rnand PT and neb machine. Emma with [facility] is aware and will collect pts information from the chart. [place] services to start within 48 hours of discharge. Neb machine to be delivered to pts ALF today. PT and pts nurse aware of discharge arrangements.  10/25/13 [place], RN BSN CM Pt to be discharged over the weekend to ALF. CSW to arrange discharge to facility. HH RN will be resumed with [facility] and [place] services to start within 48 hours of discharge. Pt and pts nurse aware of  discharge.  10/24/13 [place], RN BSN CM

## 2013-10-24 NOTE — Consult Note (Signed)
Primary cardiologist: Establishing with Dr. Kate Sable Consulting cardiologist: Dr. Satira Sark  Clinical Summary [name] is a 63 y.o.male currently admitted with intermittently productive but mainly dry cough, reportedly noted over the last month. Limited information is available at this time. My understanding is that he was recently hospitalized at [facility] in [place] with acute on chronic systolic heart failure. He was actually scheduled to establish with Dr. Bronson Ing tomorrow in our [place] office for cardiology followup, and also with Dr. Luan Pulling for primary care, having relocated to this area just recently. He tells me that he has been in [place] for approximately 10 days, states that he has been taking his medications as directed. He denies any fevers or chills, reports stable appetite. He is not aware of any significant chronic lung disease, has not been around any sick contacts as best he knows.  He does not recall the details of his cardiac history. States that he had heart surgery years ago including placement of an ICD. Does not recall having a heart attack. He denies any palpitations or device discharges.  Assessment finds renal insufficiency which is presumably chronic, creatinine 1.9, [place] [address], and chest x-ray with cardiomegaly, probable ICD noted, also status post sternotomy, with mild interstitial prominence but no definite edema or effusions. He is also noted to be anemic with hemoglobin 9.2 down to 8.9, and hyperkalemic with potassium of 5.6-6.2. Outpatient regimen included Aldactone and lisinopril.   No Known Allergies  Medications Scheduled Medications: . aspirin EC  325 mg Oral Daily  . atorvastatin  80 mg Oral Daily  . carvedilol  25 mg Oral BID WC  . [START ON 11/05/2013] cloNIDine  0.2 mg Transdermal Weekly  . clopidogrel  75 mg Oral Q breakfast  . ferrous sulfate  325 mg Oral Q breakfast  . furosemide  40 mg Intravenous Q12H   . heparin  5,000 Units Subcutaneous 3 times per day  . insulin aspart  0-15 Units Subcutaneous TID WC  . insulin aspart  0-5 Units Subcutaneous QHS  . levofloxacin  500 mg Oral Q48H  . pantoprazole  40 mg Oral Daily  . pneumococcal 23 valent vaccine  0.5 mL Intramuscular Tomorrow-1000     PRN Medications: acetaminophen, acetaminophen, guaiFENesin-codeine, ondansetron (ZOFRAN) IV, ondansetron, traMADol   Past Medical History  Diagnosis Date  . Chronic systolic heart failure   . Type 2 diabetes mellitus   . Coronary atherosclerosis of native coronary artery   . Essential hypertension, benign   . CKD (chronic kidney disease) stage 3, GFR 30-59 ml/min   . Anemia   . Hyperlipidemia   . History of stroke   . Bell's palsy   . Cardiomyopathy   . Aortic insufficiency   . Alcoholism in remission   . Noncompliance with medication regimen   . NSVT (nonsustained ventricular tachycardia)   . Cardiac defibrillator in situ     Past Surgical History  Procedure Laterality Date  . Pacemaker insertion    . Cardiac surgery      Family History  Problem Relation Age of Onset  . Kidney disease Mother   . Kidney disease Sister     Social History Mr. Geiser reports that he has quit smoking. His smoking use included Cigarettes. He smoked 0.00 packs per day. He does not have any smokeless tobacco history on file. Mr. Plude reports that he does not drink alcohol.  Review of Systems No unusual weight loss. Not complaining of any leg edema.  States that his breathing overall is at baseline. No angina. Otherwise as outlined above.  Physical Examination Blood pressure 130/89, pulse 73, temperature 98.3 F (36.8 C), temperature source Oral, resp. rate 20, height 5\' 2"  (1.575 m), weight 142 lb 3.2 oz (64.5 kg), SpO2 93.00%.  Intake/Output Summary (Last 24 hours) at 10/24/13 1001 Last data filed at 10/24/13 0844  Gross per 24 hour  Intake    410 ml  Output    400 ml  Net     10 ml    Telemetry: Sinus rhythm.  Chronically ill-appearing male, intermittent dry cough noted. HEENT: Conjunctiva and lids normal, oropharynx clear with poor dentition. Neck: Supple, elevated JVP. No carotid bruits, no thyromegaly. Lungs: Coarse breath sounds without rales, nonlabored breathing at rest. Cardiac: Regular rate and rhythm, no S3, apical systolic murmur, no pericardial rub. Abdomen: Soft, nontender, bowel sounds present, no guarding or rebound. Extremities: Trace to 1+ edema, distal pulses 2+. Skin: Warm and dry. Musculoskeletal: No kyphosis. Neuropsychiatric: Alert and oriented x3, affect somewhat blunted.   Lab Results  Basic Metabolic Panel:  Recent Labs Lab 10/23/13 1502 10/24/13 0548  NA 137 134*  K 5.6* 6.2*  CL 105 101  CO2 21 20  GLUCOSE 127* 113*  BUN 60* 64*  CREATININE 1.89* 1.98*  CALCIUM 9.0 8.7    Liver Function Tests:  Recent Labs Lab 10/23/13 1502  AST 49*  ALT 46  ALKPHOS 265*  BILITOT 1.0  PROT 8.6*  ALBUMIN 2.5*    CBC:  Recent Labs Lab 10/23/13 1502 10/24/13 0548  WBC 2.6* 3.2*  HGB 9.2* 8.9*  HCT 29.8* 28.9*  MCV 87.9 87.0  PLT 264 259    Cardiac Enzymes:  Recent Labs Lab 10/23/13 1502  TROPONINI <0.30    ECG Sinus rhythm with LVH and left axis deviation, probable repolarization abnormalities. No old tracing for comparison.  Imaging CHEST 2 VIEW  COMPARISON: None.  FINDINGS: Left subclavian pacemaker lead is present at the right ventricular apex. The heart is mildly enlarged status post median sternotomy. The lungs demonstrate mild interstitial prominence and central airway thickening. There is no confluent airspace opacity or definite edema. There is mild fissural thickening without pleural effusion.  IMPRESSION: The cardiomegaly with interstitial prominence and central airway thickening. No overt pulmonary edema or confluent airspace opacity.   Impression  1. Presentation with intermittent cough  as noted. Patient denies any major change in baseline shortness of breath which is NYHA class 2-3. No recent chest pain, palpitations, or device discharges. Details of his hospitalization at [facility] in [place] are not clear at this point, it sounds like he was diuresed, also treated with antibiotics which he states he completed.  2. Cardiomyopathy, etiology not certain but potentially ischemic with reported history of heart surgery, also status post ICD. Records need to be obtained from [facility] regarding the details of the patient's cardiac history and workup over time.  3. 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  4. CKD, stage III, current creatinine 1.9. Patient noted to be hyperkalemic as well, on ACE inhibitor and Aldactone as an outpatient recently.  5. Type 2 diabetes mellitus.  6. Hypertension.  7. Prior history of stroke.  8. Hyperlipidemia, on statin therapy.   Recommendations  Will request records be obtained from [facility] regarding the patient's recent hospital stay and cardiac history. An echocardiogram will be obtained at this facility for baseline going forward to define cardiac structure and function. Regimen includes aspirin, Lipitor,  Coreg, clonidine, Plavix, and IV Lasix for now. Agree with holding ACE inhibitor and Aldactone for time being. We might consider transitioning to an ARB eventually. Not entirely clear that the patient's cough is purely cardiac related, would search for other etiologies, pending workup by Dr. Luan Pulling. Once he is further stabilized and discharged, we will make sure that he keeps followup with Dr. Bronson Ing as originally planned.   Satira Sark, M.D., F.A.C.C.

## 2013-10-24 NOTE — Progress Notes (Signed)
  Echocardiogram 2D Echocardiogram has been performed.  Keith Young 10/24/2013, 2:50 PM

## 2013-10-25 ENCOUNTER — Ambulatory Visit: Payer: Self-pay | Admitting: Cardiovascular Disease

## 2013-10-25 DIAGNOSIS — I5023 Acute on chronic systolic (congestive) heart failure: Principal | ICD-10-CM

## 2013-10-25 LAB — BASIC METABOLIC PANEL
BUN: 65 mg/dL — ABNORMAL HIGH (ref 6–23)
CO2: 20 mEq/L (ref 19–32)
Calcium: 8.5 mg/dL (ref 8.4–10.5)
Chloride: 104 mEq/L (ref 96–112)
Creatinine, Ser: 2.16 mg/dL — ABNORMAL HIGH (ref 0.50–1.35)
GFR calc Af Amer: 36 mL/min — ABNORMAL LOW (ref >90)
GFR, EST NON AFRICAN AMERICAN: 31 mL/min — AB (ref >90)
GLUCOSE: 142 mg/dL — AB (ref 70–99)
Potassium: 4.2 mEq/L (ref 3.7–5.3)
SODIUM: 138 meq/L (ref 137–147)

## 2013-10-25 LAB — GLUCOSE, CAPILLARY
GLUCOSE-CAPILLARY: 90 mg/dL (ref 70–99)
GLUCOSE-CAPILLARY: 107 mg/dL — AB (ref 70–99)
Glucose-Capillary: 166 mg/dL — ABNORMAL HIGH (ref 70–99)
Glucose-Capillary: 168 mg/dL — ABNORMAL HIGH (ref 70–99)

## 2013-10-25 LAB — IRON AND TIBC
Iron: 28 ug/dL — ABNORMAL LOW (ref 42–135)
Saturation Ratios: 11 % — ABNORMAL LOW (ref 20–55)
TIBC: 256 ug/dL (ref 215–435)
UIBC: 228 ug/dL (ref 125–400)

## 2013-10-25 LAB — FERRITIN: Ferritin: 213 ng/mL (ref 22–322)

## 2013-10-25 LAB — PHOSPHORUS: Phosphorus: 5.3 mg/dL — ABNORMAL HIGH (ref 2.3–4.6)

## 2013-10-25 NOTE — Progress Notes (Signed)
Subjective: He says he feels better. His cough is improving. He has no new complaints.  Objective: Vital signs in last 24 hours: Temp:  [98 F (36.7 C)-98.7 F (37.1 C)] 98.7 F (37.1 C) (05/29 0240) Pulse Rate:  [68-80] 68 (05/29 0316) Resp:  [18-20] 18 (05/29 0240) BP: (82-138)/(56-90) 111/70 mmHg (05/29 0316) SpO2:  [91 %-97 %] 93 % (05/29 0718) Weight:  [64.638 kg (142 lb 8 oz)] 64.638 kg (142 lb 8 oz) (05/29 0442) Weight change: 1.361 kg (3 lb) Last BM Date: 10/24/13  Intake/Output from previous day: 05/28 0701 - 05/29 0700 In: 1770.9 [P.O.:890; I.V.:880.9] Out: 925 [Urine:925]  PHYSICAL EXAM General appearance: alert, cooperative and no distress Resp: clear to auscultation bilaterally Cardio: regular rate and rhythm, S1, S2 normal, no murmur, click, rub or gallop GI: soft, non-tender; bowel sounds normal; no masses,  no organomegaly Extremities: extremities normal, atraumatic, no cyanosis or edema  Lab Results:    Basic Metabolic Panel:  Recent Labs  10/24/13 0548 10/25/13 0529  NA 134* 138  K 6.2* 4.2  CL 101 104  CO2 20 20  GLUCOSE 113* 142*  BUN 64* 65*  CREATININE 1.98* 2.16*  CALCIUM 8.7 8.5  PHOS  --  5.3*   Liver Function Tests:  Recent Labs  10/23/13 1502  AST 49*  ALT 46  ALKPHOS 265*  BILITOT 1.0  PROT 8.6*  ALBUMIN 2.5*   No results found for this basename: LIPASE, AMYLASE,  in the last 72 hours No results found for this basename: AMMONIA,  in the last 72 hours CBC:  Recent Labs  10/23/13 1502 10/24/13 0548  WBC 2.6* 3.2*  HGB 9.2* 8.9*  HCT 29.8* 28.9*  MCV 87.9 87.0  PLT 264 259   Cardiac Enzymes:  Recent Labs  10/23/13 1502  TROPONINI <0.30   BNP:  Recent Labs  10/23/13 1502  PROBNP 42136.0*   D-Dimer: No results found for this basename: DDIMER,  in the last 72 hours CBG:  Recent Labs  10/23/13 2052 10/24/13 0728 10/24/13 1121 10/24/13 1619 10/24/13 2041 10/25/13 0735  GLUCAP 115* 107* 101* 115*  132* 166*   Hemoglobin A1C: No results found for this basename: HGBA1C,  in the last 72 hours Fasting Lipid Panel: No results found for this basename: CHOL, HDL, LDLCALC, TRIG, CHOLHDL, LDLDIRECT,  in the last 72 hours Thyroid Function Tests: No results found for this basename: TSH, T4TOTAL, FREET4, T3FREE, THYROIDAB,  in the last 72 hours Anemia Panel: No results found for this basename: VITAMINB12, FOLATE, FERRITIN, TIBC, IRON, RETICCTPCT,  in the last 72 hours Coagulation: No results found for this basename: LABPROT, INR,  in the last 72 hours Urine Drug Screen: Drugs of Abuse  No results found for this basename: labopia, cocainscrnur, labbenz, amphetmu, thcu, labbarb    Alcohol Level: No results found for this basename: ETH,  in the last 72 hours Urinalysis:  Recent Labs  10/23/13 1545  COLORURINE YELLOW  LABSPEC 1.025  PHURINE 5.5  GLUCOSEU NEGATIVE  HGBUR SMALL*  BILIRUBINUR NEGATIVE  KETONESUR NEGATIVE  PROTEINUR 100*  UROBILINOGEN 0.2  NITRITE NEGATIVE  LEUKOCYTESUR NEGATIVE   Misc. Labs:  ABGS No results found for this basename: PHART, PCO2, PO2ART, TCO2, HCO3,  in the last 72 hours CULTURES No results found for this or any previous visit (from the past 240 hour(s)). Studies/Results: Dg Chest 2 View  10/23/2013   CLINICAL DATA:  Cough for 2 months. Resident of the adult family care home.  EXAM: CHEST  2 VIEW  COMPARISON:  None.  FINDINGS: Left subclavian pacemaker lead is present at the right ventricular apex. The heart is mildly enlarged status post median sternotomy. The lungs demonstrate mild interstitial prominence and central airway thickening. There is no confluent airspace opacity or definite edema. There is mild fissural thickening without pleural effusion.  IMPRESSION: The cardiomegaly with interstitial prominence and central airway thickening. No overt pulmonary edema or confluent airspace opacity.   Electronically Signed   By: Camie Patience M.D.   On:  10/23/2013 13:42    Medications:  Prior to Admission:  Prescriptions prior to admission  Medication Sig Dispense Refill  . aspirin EC 325 MG tablet Take 325 mg by mouth daily.      Marland Kitchen atorvastatin (LIPITOR) 80 MG tablet Take 80 mg by mouth daily.      . carvedilol (COREG) 25 MG tablet Take 25 mg by mouth 2 (two) times daily with a meal.      . cloNIDine (CATAPRES - DOSED IN MG/24 HR) 0.2 mg/24hr patch Place 0.2 mg onto the skin once a week.      . clopidogrel (PLAVIX) 75 MG tablet Take 75 mg by mouth daily with breakfast.      . ferrous sulfate 325 (65 FE) MG tablet Take 325 mg by mouth daily with breakfast.      . furosemide (LASIX) 80 MG tablet Take 80 mg by mouth 2 (two) times daily.      . insulin lispro (HUMALOG) 100 UNIT/ML injection Inject 2-6 Units into the skin 3 (three) times daily with meals.      Marland Kitchen lisinopril (PRINIVIL,ZESTRIL) 20 MG tablet Take 20 mg by mouth daily.      Marland Kitchen spironolactone (ALDACTONE) 25 MG tablet Take 25 mg by mouth daily.      . traMADol (ULTRAM) 50 MG tablet Take 50 mg by mouth every 6 (six) hours as needed for moderate pain.       Scheduled: . albuterol  2.5 mg Nebulization BID  . aspirin EC  325 mg Oral Daily  . atorvastatin  80 mg Oral Daily  . carvedilol  25 mg Oral BID WC  . [START ON 11/05/2013] cloNIDine  0.2 mg Transdermal Weekly  . clopidogrel  75 mg Oral Q breakfast  . ferrous sulfate  325 mg Oral Q breakfast  . furosemide  40 mg Intravenous Q12H  . heparin  5,000 Units Subcutaneous 3 times per day  . insulin aspart  0-15 Units Subcutaneous TID WC  . insulin aspart  0-5 Units Subcutaneous QHS  . levofloxacin  500 mg Oral Q48H  . pantoprazole  40 mg Oral Daily  . pneumococcal 23 valent vaccine  0.5 mL Intramuscular Tomorrow-1000   Continuous: . sodium chloride 55 mL/hr at 10/25/13 [id number]   [id number], acetaminophen, albuterol, benzonatate, guaiFENesin-codeine, ondansetron (ZOFRAN) IV, ondansetron, traMADol  Assesment: He came in with  severe cough and is not totally clear what that was from. He also had worsening chronic kidney failure and today his renal function is a little bit worse. He does have congestive heart failure. We are awaiting all the information from his previous hospitalization. He is generally improved but I'm not ready to discharge him until I see that his renal function is closer to baseline. Principal Problem:   Cough Active Problems:   CHF (congestive heart failure), NYHA class III   CKD (chronic kidney disease)   Type II or unspecified type diabetes mellitus without mention of complication, not stated as uncontrolled  Essential hypertension, benign   Hyperkalemia   CAD (coronary artery disease)   History of stroke   Anemia of chronic kidney failure   Other and unspecified hyperlipidemia   Acute on chronic systolic heart failure    Plan: As above    LOS: 2 days   [name] 10/25/2013, 8:54 AM

## 2013-10-25 NOTE — Consult Note (Addendum)
[facility]  A. , MD     [email or web address]          Keith Young is an 62 y.o. male.   ASSESSMENT/PLAN: 1. 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 This is a lacunar phenomenon. The patient has been placed on dual antiplatelet agent of aspirin and Plavix. From my perspective, he only needs a single antiplatelet agent because of the inherent risk of increased hemorrhage with a dual antiplatelet agents without additional benefit. Consequent, recommend discontinuing one of these agents. He was on aspirin for be admitted recently and I think this should be enough.  2. Multiple chronic infarcts including a large vessel right temporal occipital infarct and multiple lacunar infarcts. Again, continue with the aspirin therapy and statin medication.  3. Multiple infarcts with increased risk of vascular related cognitive impairment.  4. Congestive heart failure with ejection fraction of 25%.  5. Acute on chronic renal insufficiency.   This is a 62-year-old left-handed black male who presented to the hospital with coughing. He has a significant history of congestive heart failure. He was recently admitted to a [facility] and had extensive workup for roughly similar complaints. Appears that he had congestive heart failure related to this admission. He complained of the right perioral numbness occurs episodically. I did review the notes from the [facility]. He saw a neurologist there who relates that the patient's right perioral numbness occurs with her eating. The patient concurs with this assessment during my evaluation today. He apparently has had this for a while. The patient admits that he has had a stroke many years ago but nothing recent per the patient. He was noted to have facial asymmetry during the [facility] evaluation. The question about his knee tells me that he's had this for a long time. The patient does not report having a lot of symptoms at  this time. He denies focal numbness right now. Again, he does have episodic perioral numbness when he eats sometimes. There is no headaches, dyspnea, shortness of breath at this time, dizziness, dysarthria or dysphagia. Review of systems essentially unremarkable. The patient did have VQ scan showed low probability because of elevated d-dimer during his straw to admission. He also had a new echo showed ejection fraction of 25%.   GENERAL: This a pleasant average weight man in no acute distress.  HEENT: Supple. Atraumatic normocephalic.   ABDOMEN: soft  EXTREMITIES: No edema   BACK: Normal.  SKIN: Normal by inspection.    MENTAL STATUS: Alert and oriented. Speech, language and cognition are generally intact. Judgment and insight normal.   CRANIAL NERVES: Pupils are equal, round and reactive to light and accommodation; extra ocular movements are full, there is no significant nystagmus; visual fields are full; upper and lower facial muscles are normal in strength and symmetric, there is flattening of the nasolabial fold on the left side with the mild to moderate left lower facial muscle weakness; tongue seemed to deviate slightly to the left side; uvula is midline; shoulder elevation is normal.  MOTOR: There appears be left hip flexion weakness graded as 4/5. The rest of the left lower extremity shows normal tone and bulk along with strength. Other extremities shows normal tone, bulk and strength. There is no pronator drift.  COORDINATION: Left finger to nose is normal, right finger to nose is normal, No rest tremor; no intention tremor; no postural tremor; no bradykinesia.  REFLEXES: Deep tendon reflexes are symmetrical and normal.    SENSATION: Normal to light touch.    Recent head CT scan done a [facility] shows an acute right paramedian pontine infarct. He also has multiple Chronic lacunar infarcts involving the right caudate nucleus, left thalamic nucleus and the left centrum semiovale.  There is also a chronic large vessel cortical infarct involving the right temporal occipital region.   Past Medical History  Diagnosis Date  . Chronic systolic heart failure   . Type 2 diabetes mellitus   . Coronary atherosclerosis of native coronary artery   . Essential hypertension, benign   . CKD (chronic kidney disease) stage 3, GFR 30-59 ml/min   . Anemia   . Hyperlipidemia   . History of stroke   . Bell's palsy   . Cardiomyopathy   . Aortic insufficiency   . Alcoholism in remission   . Noncompliance with medication regimen   . NSVT (nonsustained ventricular tachycardia)   . Cardiac defibrillator in situ     Past Surgical History  Procedure Laterality Date  . Pacemaker insertion    . Cardiac surgery      Family History  Problem Relation Age of Onset  . Kidney disease Mother   . Kidney disease Sister     Social History:  reports that he has quit smoking. His smoking use included Cigarettes. He smoked 0.00 packs per day. He does not have any smokeless tobacco history on file. He reports that he does not drink alcohol or use illicit drugs.  Allergies: No Known Allergies  Medications: Prior to Admission medications   Medication Sig Start Date End Date Taking? Authorizing Provider  aspirin EC 325 MG tablet Take 325 mg by mouth daily.   Yes Historical Provider, MD  atorvastatin (LIPITOR) 80 MG tablet Take 80 mg by mouth daily.   Yes Historical Provider, MD  carvedilol (COREG) 25 MG tablet Take 25 mg by mouth 2 (two) times daily with a meal.   Yes Historical Provider, MD  cloNIDine (CATAPRES - DOSED IN MG/24 HR) 0.2 mg/24hr patch Place 0.2 mg onto the skin once a week.   Yes Historical Provider, MD  clopidogrel (PLAVIX) 75 MG tablet Take 75 mg by mouth daily with breakfast.   Yes Historical Provider, MD  ferrous sulfate 325 (65 FE) MG tablet Take 325 mg by mouth daily with breakfast.   Yes Historical Provider, MD  furosemide (LASIX) 80 MG tablet Take 80 mg by mouth 2  (two) times daily.   Yes Historical Provider, MD  insulin lispro (HUMALOG) 100 UNIT/ML injection Inject 2-6 Units into the skin 3 (three) times daily with meals.   Yes Historical Provider, MD  lisinopril (PRINIVIL,ZESTRIL) 20 MG tablet Take 20 mg by mouth daily.   Yes Historical Provider, MD  spironolactone (ALDACTONE) 25 MG tablet Take 25 mg by mouth daily.   Yes Historical Provider, MD  traMADol (ULTRAM) 50 MG tablet Take 50 mg by mouth every 6 (six) hours as needed for moderate pain.   Yes Historical Provider, MD    Scheduled Meds: . albuterol  2.5 mg Nebulization BID  . aspirin EC  325 mg Oral Daily  . atorvastatin  80 mg Oral Daily  . carvedilol  25 mg Oral BID WC  . [START ON 11/05/2013] cloNIDine  0.2 mg Transdermal Weekly  . clopidogrel  75 mg Oral Q breakfast  . ferrous sulfate  325 mg Oral Q breakfast  . furosemide  40 mg Intravenous Q12H  . heparin  5,000 Units Subcutaneous 3 times per day  . insulin aspart  0-15 Units Subcutaneous TID WC  .   insulin aspart  0-5 Units Subcutaneous QHS  . levofloxacin  500 mg Oral Q48H  . pantoprazole  40 mg Oral Daily  . pneumococcal 23 valent vaccine  0.5 mL Intramuscular Tomorrow-1000   Continuous Infusions: . sodium chloride 100 mL/hr at 10/25/13 0922   PRN Meds:.acetaminophen, acetaminophen, albuterol, benzonatate, guaiFENesin-codeine, ondansetron (ZOFRAN) IV, ondansetron, traMADol   Blood pressure 108/73, pulse 70, temperature 98.4 F (36.9 C), temperature source Oral, resp. rate 18, height 5' 2" (1.575 m), weight 64.638 kg (142 lb 8 oz), SpO2 95.00%.   Results for orders placed during the hospital encounter of 10/23/13 (from the past 48 hour(s))  GLUCOSE, CAPILLARY     Status: Abnormal   Collection Time    10/23/13  4:22 PM      Result Value Ref Range   Glucose-Capillary 116 (*) 70 - 99 mg/dL  GLUCOSE, CAPILLARY     Status: Abnormal   Collection Time    10/23/13  8:52 PM      Result Value Ref Range   Glucose-Capillary 115 (*)  70 - 99 mg/dL   Comment 1 Notify RN     Comment 2 Documented in Chart    BASIC METABOLIC PANEL     Status: Abnormal   Collection Time    10/24/13  5:48 AM      Result Value Ref Range   Sodium 134 (*) 137 - 147 mEq/L   Potassium 6.2 (*) 3.7 - 5.3 mEq/L   Chloride 101  96 - 112 mEq/L   CO2 20  19 - 32 mEq/L   Glucose, Bld 113 (*) 70 - 99 mg/dL   BUN 64 (*) 6 - 23 mg/dL   Creatinine, Ser 1.98 (*) 0.50 - 1.35 mg/dL   Calcium 8.7  8.4 - 10.5 mg/dL   GFR calc non Af Amer 35 (*) >90 mL/min   GFR calc Af Amer 40 (*) >90 mL/min   Comment: (NOTE)     The eGFR has been calculated using the CKD EPI equation.     This calculation has not been validated in all clinical situations.     eGFR's persistently <90 mL/min signify possible Chronic Kidney     Disease.  CBC     Status: Abnormal   Collection Time    10/24/13  5:48 AM      Result Value Ref Range   WBC 3.2 (*) 4.0 - 10.5 K/uL   RBC 3.32 (*) 4.22 - 5.81 MIL/uL   Hemoglobin 8.9 (*) 13.0 - 17.0 g/dL   HCT 28.9 (*) 39.0 - 52.0 %   MCV 87.0  78.0 - 100.0 fL   MCH 26.8  26.0 - 34.0 pg   MCHC 30.8  30.0 - 36.0 g/dL   RDW 16.6 (*) 11.5 - 15.5 %   Platelets 259  150 - 400 K/uL  GLUCOSE, CAPILLARY     Status: Abnormal   Collection Time    10/24/13  7:28 AM      Result Value Ref Range   Glucose-Capillary 107 (*) 70 - 99 mg/dL   Comment 1 Notify RN    GLUCOSE, CAPILLARY     Status: Abnormal   Collection Time    10/24/13 11:21 AM      Result Value Ref Range   Glucose-Capillary 101 (*) 70 - 99 mg/dL   Comment 1 Notify RN    GLUCOSE, CAPILLARY     Status: Abnormal   Collection Time    10/24/13  4:19 PM        Result Value Ref Range   Glucose-Capillary 115 (*) 70 - 99 mg/dL   Comment 1 Notify RN     Comment 2 Documented in Chart    GLUCOSE, CAPILLARY     Status: Abnormal   Collection Time    10/24/13  8:41 PM      Result Value Ref Range   Glucose-Capillary 132 (*) 70 - 99 mg/dL   Comment 1 Notify RN     Comment 2 Documented in Chart     BASIC METABOLIC PANEL     Status: Abnormal   Collection Time    10/25/13  5:29 AM      Result Value Ref Range   Sodium 138  137 - 147 mEq/L   Potassium 4.2  3.7 - 5.3 mEq/L   Comment: DELTA CHECK NOTED   Chloride 104  96 - 112 mEq/L   CO2 20  19 - 32 mEq/L   Glucose, Bld 142 (*) 70 - 99 mg/dL   BUN 65 (*) 6 - 23 mg/dL   Creatinine, Ser 2.16 (*) 0.50 - 1.35 mg/dL   Calcium 8.5  8.4 - 10.5 mg/dL   GFR calc non Af Amer 31 (*) >90 mL/min   GFR calc Af Amer 36 (*) >90 mL/min   Comment: (NOTE)     The eGFR has been calculated using the CKD EPI equation.     This calculation has not been validated in all clinical situations.     eGFR's persistently <90 mL/min signify possible Chronic Kidney     Disease.  PHOSPHORUS     Status: Abnormal   Collection Time    10/25/13  5:29 AM      Result Value Ref Range   Phosphorus 5.3 (*) 2.3 - 4.6 mg/dL  GLUCOSE, CAPILLARY     Status: Abnormal   Collection Time    10/25/13  7:35 AM      Result Value Ref Range   Glucose-Capillary 166 (*) 70 - 99 mg/dL   Comment 1 Notify RN    GLUCOSE, CAPILLARY     Status: Abnormal   Collection Time    10/25/13 11:25 AM      Result Value Ref Range   Glucose-Capillary 168 (*) 70 - 99 mg/dL   Comment 1 Notify RN      No results found.       A. , M.D.  Diplomate, American Board of Psychiatry and Neurology ( Neurology). 10/25/2013, 4:17 PM       

## 2013-10-25 NOTE — Progress Notes (Signed)
Patient evaluated for community based chronic disease management services with [facility] as a benefit of patient's [employer].  Douglas spoke with patient and family about [facility] services.  Unable to reach patient via phone this am to confirm service engagement consent.  Noted that patient will be returning to a family care home operated by his family.  His PCP has been confirmed as Dr Luan Pulling and his Medicare card has been verified by inpatient SW.  Patient will receive a post discharge transition of care call and will be evaluated for monthly home visits for assessments and CHF disease process education.  Left contact information and [facility] literature at bedside. Made Inpatient Case Manager aware that [facility] following. Of note, [facility] Care Management services does not replace or interfere with any services that are arranged by inpatient case management or social work.  For additional questions or referrals please contact Corliss Blacker BSN RN [facility] Liaison at [phone].

## 2013-10-25 NOTE — ED Provider Notes (Signed)
Medical screening examination/treatment/procedure(s) were performed by non-physician practitioner and as supervising physician I was immediately available for consultation/collaboration.   EKG Interpretation   Date/Time:  Wednesday Oct 23 2013 13:05:53 EDT Ventricular Rate:  62 PR Interval:  160 QRS Duration: 110 QT Interval:  434 QTC Calculation: 440 R Axis:   -35 Text Interpretation:  Normal sinus rhythm Possible Left atrial enlargement  Left axis deviation Left ventricular hypertrophy ST \\T \ T wave  abnormality, consider lateral ischemia Abnormal ECG ED PHYSICIAN  INTERPRETATION AVAILABLE IN [facility] Confirmed by TEST, Record  ([id number]) on 10/25/2013 7:24:02 AM        Maudry Diego, MD 10/25/13 1714

## 2013-10-25 NOTE — Progress Notes (Signed)
Primary cardiologist: Dr. Kate Sable Consulting cardiologist: Dr. Satira Sark  Subjective:   Patient resting this morning, easily wakes up. States that he feels better in terms of less coughing. No chest pain.   Objective:   Temp:  [98 F (36.7 C)-98.7 F (37.1 C)] 98.7 F (37.1 C) (05/29 0240) Pulse Rate:  [68-80] 68 (05/29 0316) Resp:  [18-20] 18 (05/29 0240) BP: (82-138)/(56-90) 111/70 mmHg (05/29 0316) SpO2:  [91 %-97 %] 93 % (05/29 0718) Weight:  [142 lb 8 oz (64.638 kg)] 142 lb 8 oz (64.638 kg) (05/29 0442) Last BM Date: 10/24/13  Filed Weights   10/23/13 1900 10/24/13 0537 10/25/13 0442  Weight: 139 lb 8 oz (63.277 kg) 142 lb 3.2 oz (64.5 kg) 142 lb 8 oz (64.638 kg)    Intake/Output Summary (Last 24 hours) at 10/25/13 1053 Last data filed at 10/25/13 0651  Gross per 24 hour  Intake 1650.91 ml  Output    725 ml  Net 925.91 ml    Telemetry: Sinus rhythm.  Exam:  General: Chronically ill-appearing, no distress.  HEENT: Poor dentition.  Lungs: Coarse breath sounds with prolonged expiratory phase and scattered rhonchi.  Cardiac: RRR, 2/6 systolic murmur at the base, indistinct PMI. Elevated JVP.  Abdomen: NABS.  Extremities: No pitting.   Lab Results:  Basic Metabolic Panel:  Recent Labs Lab 10/23/13 1502 10/24/13 0548 10/25/13 0529  NA 137 134* 138  K 5.6* 6.2* 4.2  CL 105 101 104  CO2 21 20 20   GLUCOSE 127* 113* 142*  BUN 60* 64* 65*  CREATININE 1.89* 1.98* 2.16*  CALCIUM 9.0 8.7 8.5    Liver Function Tests:  Recent Labs Lab 10/23/13 1502  AST 49*  ALT 46  ALKPHOS 265*  BILITOT 1.0  PROT 8.6*  ALBUMIN 2.5*    CBC:  Recent Labs Lab 10/23/13 1502 10/24/13 0548  WBC 2.6* 3.2*  HGB 9.2* 8.9*  HCT 29.8* 28.9*  MCV 87.9 87.0  PLT 264 259    Cardiac Enzymes:  Recent Labs Lab 10/23/13 1502  TROPONINI <0.30    BNP:  Recent Labs  10/23/13 1502  PROBNP 42136.0*    Echocardiogram  (10/24/13): Study Conclusions  - Left ventricle: The cavity size was moderately dilated. Wall thickness was increased in a pattern of mild LVH. Systolic function was severely reduced. The estimated ejection fraction was in the range of 15% to 20%. Spontaneous echocontrast and low stroke volume consistent with low cardiac output. There is akinesis of the anteroseptal, anterior, mid to distal inferior, inferoseptal, and apical myocardium. Features are consistent with a pseudonormal left ventricular filling pattern, with concomitant abnormal relaxation and increased filling pressure (grade 2 diastolic dysfunction). Doppler parameters are consistent with elevated ventricular end-diastolic filling pressure. - Ventricular septum: Septal motion showed abnormal function and dyssynergy. - Aortic valve: Appears heavily calcified with poorly defined leaflet structure - cannot completely exclude stented bioprosthesis, but history at this point does not confirm this. There was possible moderate to severe stenosis, low gradient. There was no significant regurgitation. Mean gradient (S): 10 mm Hg. Peak gradient (S): 18 mm Hg. Valve area (VTI): 1.09 cm^2. Valve area (Vmax): 1 cm^2. LVOT/AV VTI ratio 0.31. - Mitral valve: Mildly thickened leaflets . There was mild regurgitation. - Left atrium: The atrium was moderately to severely dilated. - Right ventricle: The cavity size was moderately dilated. Pacer wire or catheter noted in right ventricle. Systolic function was moderately to severely reduced. - Right atrium: The atrium was  mildly to moderately dilated. Central venous pressure (est): 15 mm Hg. - Tricuspid valve: There was moderate regurgitation. - Pulmonic valve: There was mild regurgitation. - Pulmonary arteries: PA peak pressure: 52 mm Hg (S). - Pericardium, extracardiac: There was no pericardial effusion.  Impressions:  - Moderate LV dilatation with mild LVH and LVEF approximately 15-20%  and low cardiac output. Wall motion abnormalities and abnormal septal motion as outlined. Grade 2 diastolic dysfunction with increased filling pressures. Moderate to severe left atrial enlargement. Mild mitral regurgitation. Abnormal aortic valve as defined above - cannot exclude significant aortic stenosis based on available information. Device wire in right heart. Severe RV dysfunction. Moderate tricuspid regurgitation with PASP 52 mmHg and elevated CVP.   Medications:   Scheduled Medications: . albuterol  2.5 mg Nebulization BID  . aspirin EC  325 mg Oral Daily  . atorvastatin  80 mg Oral Daily  . carvedilol  25 mg Oral BID WC  . [START ON 11/05/2013] cloNIDine  0.2 mg Transdermal Weekly  . clopidogrel  75 mg Oral Q breakfast  . ferrous sulfate  325 mg Oral Q breakfast  . furosemide  40 mg Intravenous Q12H  . heparin  5,000 Units Subcutaneous 3 times per day  . insulin aspart  0-15 Units Subcutaneous TID WC  . insulin aspart  0-5 Units Subcutaneous QHS  . levofloxacin  500 mg Oral Q48H  . pantoprazole  40 mg Oral Daily  . pneumococcal 23 valent vaccine  0.5 mL Intramuscular Tomorrow-1000    Infusions: . sodium chloride 100 mL/hr at 10/25/13 0922    PRN Medications: acetaminophen, acetaminophen, albuterol, benzonatate, guaiFENesin-codeine, ondansetron (ZOFRAN) IV, ondansetron, traMADol   Assessment:   Voluminous records received from [place], [facility].  Recent hospital stay in mid May with finding of new stroke, head CT demonstrating new areas of ischemia in the right paramedian pons, He was seen by Neurology and treated with aspirin and statin. Also acute on chronic systolic heart failure, LVEF reportedly in the range of 20-25%, description of aortic insufficiency, and history of prior ICD. Further records indicate prior bioprosthetic aortic valve replacement which further clarifies our echocardiographic findings. Reportedly normal coronaries  at cardiac catheterization in 2014. History of medication noncompliance, also prior substance abuse.   1. Nonischemic cardiomyopathy based on record review, reportedly normal coronary arteries at cardiac catheterization last year in [place]. LVEF approximately 20% with grade 2 diastolic dysfunction and increased filling pressures. He has evidence of low output, possible that some of his dry cough could be secondary to recent medications and also potentially heart failure. He has not diuresed much as yet on IV Lasix.  2. Reported recent stroke in May as detailed above, managed in [place].  3. History of bioprosthetic AVR. This clarifies our echocardiographic findings. The present gradients look to be stable compared to prior echocardiogram done in [place]. He has evidence of a least mild prosthetic stenosis however. No significant aortic regurgitation.  4. Reported history of PAF. Not entirely clear to me why he was not considered for Coumadin in light of recent stroke.  5. CKD, stage 3.  6. Status post ICD in 2014, records to not clarify type.  7. Hyperlipidemia, on statin therapy.  8. Type 2 diabetes mellitus.   Plan/Discussion:    Complex patient, records reviewed as outlined above. He has evidence of low output heart failure, may be at least contributing somewhat to his dry cough. He has been taken off ACE inhibitor and started on IV  diuretic, feels somewhat better today. Renal function is stable but abnormal at baseline. I would not restart Aldactone at this time particularly with his recent hyperkalemia. Question remains whether he would be a candidate for advanced heart failure evaluation, possibly even inotropes. He is not ready for discharge as yet.   Satira Sark, M.D., F.A.C.C.

## 2013-10-25 NOTE — Progress Notes (Signed)
This is documentation of my visit of 10/24/2013. This is first time that I have seen this patient. He lives at an assisted living center. He had been in the hospital in [place]. He has multiple medical problems including chronic renal failure congestive heart failure coronary artery occlusive disease hypertension diabetes alcohol abuse. Records have been requested. He is coughing almost incessantly during the examination. His chest is relatively clear with some wheeze. His heart is regular without gallop. He does not have any edema of the extremities. Central nervous system exam is grossly intact  He is on oral medications and I have switched his medications to IV. I will request cardiology and nephrology consultation. I do not think is appropriate for observation and should be an inpatient.

## 2013-10-25 NOTE — Clinical Social Work Note (Signed)
CSW obtained copy of patient's Medicare card and provided to [place] for their info.  Informed Jeronimo Greaves, patient brother in [employer] at [facility], of MD anticipated dc date of Sat May 30.  Facility agreeable to take patient back at dc. [name] expressed concern about patient twisted mouth and complaints of right sided weakness during visit last night, also said patient complained of throat hurting.  MD informed.    Edwyna Shell, LCSW Clinical Social Worker [phone])

## 2013-10-25 NOTE — Progress Notes (Signed)
Subjective: Interval History: has no complaint of difficulty in breathing. Patient is still that the cough is much better. He doesn't have any nausea vomiting..  Objective: Vital signs in last 24 hours: Temp:  [98 F (36.7 C)-98.7 F (37.1 C)] 98.7 F (37.1 C) (05/29 0240) Pulse Rate:  [68-80] 68 (05/29 0316) Resp:  [18-20] 18 (05/29 0240) BP: (82-138)/(56-90) 111/70 mmHg (05/29 0316) SpO2:  [91 %-97 %] 93 % (05/29 0718) Weight:  [64.638 kg (142 lb 8 oz)] 64.638 kg (142 lb 8 oz) (05/29 0442) Weight change: 1.361 kg (3 lb)  Intake/Output from previous day: 05/28 0701 - 05/29 0700 In: 1770.9 [P.O.:890; I.V.:880.9] Out: 925 [Urine:925] Intake/Output this shift:    General appearance: alert, cooperative and no distress Resp: clear to auscultation bilaterally Cardio: regular rate and rhythm, S1, S2 normal, no murmur, click, rub or gallop GI: soft, non-tender; bowel sounds normal; no masses,  no organomegaly Extremities: extremities normal, atraumatic, no cyanosis or edema  Lab Results:  Recent Labs  10/23/13 1502 10/24/13 0548  WBC 2.6* 3.2*  HGB 9.2* 8.9*  HCT 29.8* 28.9*  PLT 264 259   BMET:  Recent Labs  10/24/13 0548 10/25/13 0529  NA 134* 138  K 6.2* 4.2  CL 101 104  CO2 20 20  GLUCOSE 113* 142*  BUN 64* 65*  CREATININE 1.98* 2.16*  CALCIUM 8.7 8.5   No results found for this basename: PTH,  in the last 72 hours Iron Studies: No results found for this basename: IRON, TIBC, TRANSFERRIN, FERRITIN,  in the last 72 hours  Studies/Results: Dg Chest 2 View  10/23/2013   CLINICAL DATA:  Cough for 2 months. Resident of the adult family care home.  EXAM: CHEST  2 VIEW  COMPARISON:  None.  FINDINGS: Left subclavian pacemaker lead is present at the right ventricular apex. The heart is mildly enlarged status post median sternotomy. The lungs demonstrate mild interstitial prominence and central airway thickening. There is no confluent airspace opacity or definite edema.  There is mild fissural thickening without pleural effusion.  IMPRESSION: The cardiomegaly with interstitial prominence and central airway thickening. No overt pulmonary edema or confluent airspace opacity.   Electronically Signed   By: Camie Patience M.D.   On: 10/23/2013 13:42    I have reviewed the patient's current medications.  Assessment/Plan: Problem #1 hyperkalemia: His potassium is 4.2 which normal. Problem #2 renal failure: Possibly chronic. His BUN and creatinine has increased slightly. Patient has this moment does not have any uremic sign and symptoms. Problem #3: Cough: Most likely from ACE inhibitor. Presently feels much better. Problem #4 type 2 diabetes Problem #5 hypertension: His blood pressure is reasonably controlled Problem #6 history of CVA Problem #7 anemia: His H&H is low. Possibly secondary to chronic renal failure versus iron deficiency anemia. Problem #8 metabolic bone disease: His calcium is range however his phosphorus is slightly high. Patient presently is not on a binder. Plan: We'll check his iron level. We'll increase his IV fluid to 100 cc per hour We'll check his basic metabolic panel     LOS: 2 days   Keith Young 10/25/2013,8:03 AM

## 2013-10-25 NOTE — Progress Notes (Signed)
Pharmacist Heart Failure Core Measure Documentation  Assessment: Keith Young has an EF documented as 15-20% on ECHO by 10/24/2013.  Rationale: Heart failure patients with left ventricular systolic dysfunction (LVSD) and an EF < 40% should be prescribed an angiotensin converting enzyme inhibitor (ACEI) or angiotensin receptor blocker (ARB) at discharge unless a contraindication is documented in the medical record.  This patient is not currently on an ACEI or ARB for HF.  This note is being placed in the record in order to provide documentation that a contraindication to the use of these agents is present for this encounter.  ACE Inhibitor or Angiotensin Receptor Blocker is contraindicated (specify all that apply)  []   ACEI allergy AND ARB allergy []   Angioedema []   Moderate or severe aortic stenosis []   Hyperkalemia [x]   Hypotension []   Renal artery stenosis [x]   Worsening renal function, preexisting renal disease or dysfunction   Keith Young Keith Young 10/25/2013 12:17 PM

## 2013-10-25 NOTE — Progress Notes (Signed)
UR chart review completed.  

## 2013-10-26 LAB — GLUCOSE, CAPILLARY
GLUCOSE-CAPILLARY: 81 mg/dL (ref 70–99)
GLUCOSE-CAPILLARY: 148 mg/dL — AB (ref 70–99)
Glucose-Capillary: 182 mg/dL — ABNORMAL HIGH (ref 70–99)

## 2013-10-26 LAB — BASIC METABOLIC PANEL
BUN: 55 mg/dL — AB (ref 6–23)
CHLORIDE: 103 meq/L (ref 96–112)
CO2: 21 mEq/L (ref 19–32)
CREATININE: 1.84 mg/dL — AB (ref 0.50–1.35)
Calcium: 7.9 mg/dL — ABNORMAL LOW (ref 8.4–10.5)
GFR, EST AFRICAN AMERICAN: 44 mL/min — AB (ref >90)
GFR, EST NON AFRICAN AMERICAN: 38 mL/min — AB (ref >90)
Glucose, Bld: 86 mg/dL (ref 70–99)
Potassium: 3.5 mEq/L — ABNORMAL LOW (ref 3.7–5.3)
Sodium: 136 mEq/L — ABNORMAL LOW (ref 137–147)

## 2013-10-26 MED ORDER — POLYSACCHARIDE IRON COMPLEX 150 MG PO CAPS
150.0000 mg | ORAL_CAPSULE | Freq: Every day | ORAL | Status: DC
  Administered 2013-10-27 – 2013-10-28 (×2): 150 mg via ORAL
  Filled 2013-10-26 (×2): qty 1

## 2013-10-26 NOTE — Progress Notes (Signed)
Subjective: He has no new complaints. He says he wants to go home.  Objective: Vital signs in last 24 hours: Temp:  [98.4 F (36.9 C)-98.5 F (36.9 C)] 98.5 F (36.9 C) (05/30 0550) Pulse Rate:  [66-70] 69 (05/30 0550) Resp:  [18-24] 21 (05/30 0550) BP: (108-133)/(72-88) 133/88 mmHg (05/30 0550) SpO2:  [91 %-100 %] 91 % (05/30 0719) Weight:  [64.6 kg (142 lb 6.7 oz)] 64.6 kg (142 lb 6.7 oz) (05/30 0700) Weight change: -0.038 kg (-1.3 oz) Last BM Date: 10/25/13  Intake/Output from previous day: 05/29 0701 - 05/30 0700 In: 2928.5 [P.O.:1080; I.V.:1848.5] Out: 1025 [Urine:1025]  PHYSICAL EXAM General appearance: alert, cooperative and no distress Resp: rhonchi bilaterally Cardio: His heart is regular with a systolic murmur GI: soft, non-tender; bowel sounds normal; no masses,  no organomegaly Extremities: extremities normal, atraumatic, no cyanosis or edema  Lab Results:    Basic Metabolic Panel:  Recent Labs  10/24/13 0548 10/25/13 0529 10/26/13 0633  NA 134* 138 136*  K 6.2* 4.2 3.5*  CL 101 104 103  CO2 20 20 21   GLUCOSE 113* 142* 86  BUN 64* 65* 55*  CREATININE 1.98* 2.16* 1.84*  CALCIUM 8.7 8.5 7.9*  PHOS  --  5.3*  --    Liver Function Tests:  Recent Labs  10/23/13 1502  AST 49*  ALT 46  ALKPHOS 265*  BILITOT 1.0  PROT 8.6*  ALBUMIN 2.5*   No results found for this basename: LIPASE, AMYLASE,  in the last 72 hours No results found for this basename: AMMONIA,  in the last 72 hours CBC:  Recent Labs  10/23/13 1502 10/24/13 0548  WBC 2.6* 3.2*  HGB 9.2* 8.9*  HCT 29.8* 28.9*  MCV 87.9 87.0  PLT 264 259   Cardiac Enzymes:  Recent Labs  10/23/13 1502  TROPONINI <0.30   BNP:  Recent Labs  10/23/13 1502  PROBNP 42136.0*   D-Dimer: No results found for this basename: DDIMER,  in the last 72 hours CBG:  Recent Labs  10/24/13 2041 10/25/13 0735 10/25/13 1125 10/25/13 1634 10/25/13 2110 10/26/13 0716  GLUCAP 132* 166* 168*  90 107* 81   Hemoglobin A1C: No results found for this basename: HGBA1C,  in the last 72 hours Fasting Lipid Panel: No results found for this basename: CHOL, HDL, LDLCALC, TRIG, CHOLHDL, LDLDIRECT,  in the last 72 hours Thyroid Function Tests: No results found for this basename: TSH, T4TOTAL, FREET4, T3FREE, THYROIDAB,  in the last 72 hours Anemia Panel:  Recent Labs  10/25/13 0836  FERRITIN 213  TIBC 256  IRON 28*   Coagulation: No results found for this basename: LABPROT, INR,  in the last 72 hours Urine Drug Screen: Drugs of Abuse  No results found for this basename: labopia, cocainscrnur, labbenz, amphetmu, thcu, labbarb    Alcohol Level: No results found for this basename: ETH,  in the last 72 hours Urinalysis:  Recent Labs  10/23/13 1545  COLORURINE YELLOW  LABSPEC 1.025  PHURINE 5.5  GLUCOSEU NEGATIVE  HGBUR SMALL*  BILIRUBINUR NEGATIVE  KETONESUR NEGATIVE  PROTEINUR 100*  UROBILINOGEN 0.2  NITRITE NEGATIVE  LEUKOCYTESUR NEGATIVE   Misc. Labs:  ABGS No results found for this basename: PHART, PCO2, PO2ART, TCO2, HCO3,  in the last 72 hours CULTURES No results found for this or any previous visit (from the past 240 hour(s)). Studies/Results: No results found.  Medications:  Prior to Admission:  Prescriptions prior to admission  Medication Sig Dispense Refill  . aspirin  EC 325 MG tablet Take 325 mg by mouth daily.      Marland Kitchen atorvastatin (LIPITOR) 80 MG tablet Take 80 mg by mouth daily.      . carvedilol (COREG) 25 MG tablet Take 25 mg by mouth 2 (two) times daily with a meal.      . cloNIDine (CATAPRES - DOSED IN MG/24 HR) 0.2 mg/24hr patch Place 0.2 mg onto the skin once a week.      . clopidogrel (PLAVIX) 75 MG tablet Take 75 mg by mouth daily with breakfast.      . ferrous sulfate 325 (65 FE) MG tablet Take 325 mg by mouth daily with breakfast.      . furosemide (LASIX) 80 MG tablet Take 80 mg by mouth 2 (two) times daily.      . insulin lispro  (HUMALOG) 100 UNIT/ML injection Inject 2-6 Units into the skin 3 (three) times daily with meals.      Marland Kitchen lisinopril (PRINIVIL,ZESTRIL) 20 MG tablet Take 20 mg by mouth daily.      Marland Kitchen spironolactone (ALDACTONE) 25 MG tablet Take 25 mg by mouth daily.      . traMADol (ULTRAM) 50 MG tablet Take 50 mg by mouth every 6 (six) hours as needed for moderate pain.       Scheduled: . albuterol  2.5 mg Nebulization BID  . aspirin EC  325 mg Oral Daily  . atorvastatin  80 mg Oral Daily  . carvedilol  25 mg Oral BID WC  . [START ON 11/05/2013] cloNIDine  0.2 mg Transdermal Weekly  . clopidogrel  75 mg Oral Q breakfast  . furosemide  40 mg Intravenous Q12H  . heparin  5,000 Units Subcutaneous 3 times per day  . insulin aspart  0-15 Units Subcutaneous TID WC  . insulin aspart  0-5 Units Subcutaneous QHS  . iron polysaccharides  150 mg Oral Daily  . levofloxacin  500 mg Oral Q48H  . pantoprazole  40 mg Oral Daily  . pneumococcal 23 valent vaccine  0.5 mL Intramuscular Tomorrow-1000   Continuous: . sodium chloride 100 mL/hr at 10/25/13 [id number]   [id number], acetaminophen, albuterol, benzonatate, guaiFENesin-codeine, ondansetron (ZOFRAN) IV, ondansetron, traMADol  Assesment: He was admitted with cough likely related to ACE inhibitor. This is much improved. He has a history of a recent stroke. He has acute on chronic systolic heart failure which is stable. He has coronary artery occlusive disease. He has a bioprosthetic valve. He has chronic kidney disease and that was worse but is improving. He was hyperkalemic and his potassium is down to 3.5 now Principal Problem:   Cough Active Problems:   CHF (congestive heart failure), NYHA class III   CKD (chronic kidney disease)   Type II or unspecified type diabetes mellitus without mention of complication, not stated as uncontrolled   Essential hypertension, benign   Hyperkalemia   CAD (coronary artery disease)   History of stroke   Anemia of chronic  kidney failure   Other and unspecified hyperlipidemia   Acute on chronic systolic heart failure    Plan: Continue with current treatments.    LOS: 3 days   [name] 10/26/2013, 9:49 AM

## 2013-10-26 NOTE — Progress Notes (Signed)
Subjective: Interval History: His cough is better. Presently denies any difficulty in breathing.  Objective: Vital signs in last 24 hours: Temp:  [98.4 F (36.9 C)-98.5 F (36.9 C)] 98.5 F (36.9 C) (05/30 0550) Pulse Rate:  [66-70] 69 (05/30 0550) Resp:  [18-24] 21 (05/30 0550) BP: (108-133)/(72-88) 133/88 mmHg (05/30 0550) SpO2:  [91 %-100 %] 91 % (05/30 0719) Weight:  [64.6 kg (142 lb 6.7 oz)] 64.6 kg (142 lb 6.7 oz) (05/30 0700) Weight change: -0.038 kg (-1.3 oz)  Intake/Output from previous day: 05/29 0701 - 05/30 0700 In: 2928.5 [P.O.:1080; I.V.:1848.5] Out: 1025 [Urine:1025] Intake/Output this shift:    General appearance: alert, cooperative and no distress Resp: clear to auscultation bilaterally Cardio: regular rate and rhythm, S1, S2 normal, no murmur, click, rub or gallop GI: soft, non-tender; bowel sounds normal; no masses,  no organomegaly Extremities: extremities normal, atraumatic, no cyanosis or edema  Lab Results:  Recent Labs  10/23/13 1502 10/24/13 0548  WBC 2.6* 3.2*  HGB 9.2* 8.9*  HCT 29.8* 28.9*  PLT 264 259   BMET:   Recent Labs  10/25/13 0529 10/26/13 0633  NA 138 136*  K 4.2 3.5*  CL 104 103  CO2 20 21  GLUCOSE 142* 86  BUN 65* 55*  CREATININE 2.16* 1.84*  CALCIUM 8.5 7.9*   No results found for this basename: PTH,  in the last 72 hours Iron Studies:   Recent Labs  10/25/13 0836  IRON 28*  TIBC 256  FERRITIN 213    Studies/Results: No results found.  I have reviewed the patient's current medications.  Assessment/Plan: Problem #1 hyperkalemia: His potassium is 3.5 which normal. Problem #2 renal failure: Possibly chronic. His BUN and creatinine is improving. He'll have any uremic sinus symptoms.. Problem #3: Cough: Most likely from ACE inhibitor. Presently feels much better. Problem #4 type 2 diabetes Problem #5 hypertension: His blood pressure is reasonably controlled Problem #6 history of CVA Problem #7 anemia: His  H&H is low. His iron saturation is low but his ferritin is normal. Problem #8 metabolic bone disease: His calcium is range however his phosphorus is slightly high. Patient presently is not on a binder. Plan: We'll start patient on Nu-Iron 150 mg once a day We'll continue his other treatment as before. We'll check his basic metabolic panel     LOS: 3 days   Yuliya Nova S Astor Gentle 10/26/2013,9:13 AM

## 2013-10-27 LAB — GLUCOSE, CAPILLARY
GLUCOSE-CAPILLARY: 120 mg/dL — AB (ref 70–99)
GLUCOSE-CAPILLARY: 158 mg/dL — AB (ref 70–99)
Glucose-Capillary: 119 mg/dL — ABNORMAL HIGH (ref 70–99)
Glucose-Capillary: 189 mg/dL — ABNORMAL HIGH (ref 70–99)
Glucose-Capillary: 156 mg/dL — ABNORMAL HIGH (ref 70–99)

## 2013-10-27 LAB — BASIC METABOLIC PANEL
BUN: 47 mg/dL — AB (ref 6–23)
CO2: 20 mEq/L (ref 19–32)
Calcium: 7.6 mg/dL — ABNORMAL LOW (ref 8.4–10.5)
Chloride: 104 mEq/L (ref 96–112)
Creatinine, Ser: 1.65 mg/dL — ABNORMAL HIGH (ref 0.50–1.35)
GFR calc non Af Amer: 43 mL/min — ABNORMAL LOW (ref >90)
GFR, EST AFRICAN AMERICAN: 50 mL/min — AB (ref >90)
Glucose, Bld: 109 mg/dL — ABNORMAL HIGH (ref 70–99)
POTASSIUM: 3.7 meq/L (ref 3.7–5.3)
Sodium: 137 mEq/L (ref 137–147)

## 2013-10-27 LAB — PHOSPHORUS: PHOSPHORUS: 2.5 mg/dL (ref 2.3–4.6)

## 2013-10-27 NOTE — Progress Notes (Signed)
Subjective: He says he feels well and he wants to go home. He has no other new complaints.  Objective: Vital signs in last 24 hours: Temp:  [98 F (36.7 C)] 98 F (36.7 C) (05/31 [id number]) Pulse Rate:  [68-69] 69 (05/31 0632) Resp:  [16] 16 (05/31 0632) BP: (122-124)/(81-84) 124/84 mmHg (05/31 0632) SpO2:  [96 %-99 %] 96 % (05/31 0650) Weight:  [64.547 kg (142 lb 4.8 oz)] 64.547 kg (142 lb 4.8 oz) (05/31 [id number]) Weight change: -0.053 kg (-1.9 oz) Last BM Date: 10/25/13  Intake/Output from previous day: 05/30 0701 - 05/31 0700 In: 2550 [P.O.:960; I.V.:1590] Out: 2000 [Urine:2000]  PHYSICAL EXAM General appearance: alert, cooperative and mild distress Resp: clear to auscultation bilaterally Cardio: Regular with no gallop and a systolic murmur GI: soft, non-tender; bowel sounds normal; no masses,  no organomegaly Extremities: extremities normal, atraumatic, no cyanosis or edema  Lab Results:    Basic Metabolic Panel:  Recent Labs  10/25/13 0529 10/26/13 0633 10/27/13 0558  NA 138 136* 137  K 4.2 3.5* 3.7  CL 104 103 104  CO2 20 21 20   GLUCOSE 142* 86 109*  BUN 65* 55* 47*  CREATININE 2.16* 1.84* 1.65*  CALCIUM 8.5 7.9* 7.6*  PHOS 5.3*  --  2.5   Liver Function Tests: No results found for this basename: AST, ALT, ALKPHOS, BILITOT, PROT, ALBUMIN,  in the last 72 hours No results found for this basename: LIPASE, AMYLASE,  in the last 72 hours No results found for this basename: AMMONIA,  in the last 72 hours CBC: No results found for this basename: WBC, NEUTROABS, HGB, HCT, MCV, PLT,  in the last 72 hours Cardiac Enzymes: No results found for this basename: CKTOTAL, CKMB, CKMBINDEX, TROPONINI,  in the last 72 hours BNP: No results found for this basename: PROBNP,  in the last 72 hours D-Dimer: No results found for this basename: DDIMER,  in the last 72 hours CBG:  Recent Labs  10/25/13 2110 10/26/13 0716 10/26/13 1136 10/26/13 1633 10/26/13 2132 10/27/13 0727   GLUCAP 107* 81 182* 148* 119* 120*   Hemoglobin A1C: No results found for this basename: HGBA1C,  in the last 72 hours Fasting Lipid Panel: No results found for this basename: CHOL, HDL, LDLCALC, TRIG, CHOLHDL, LDLDIRECT,  in the last 72 hours Thyroid Function Tests: No results found for this basename: TSH, T4TOTAL, FREET4, T3FREE, THYROIDAB,  in the last 72 hours Anemia Panel:  Recent Labs  10/25/13 0836  FERRITIN 213  TIBC 256  IRON 28*   Coagulation: No results found for this basename: LABPROT, INR,  in the last 72 hours Urine Drug Screen: Drugs of Abuse  No results found for this basename: labopia, cocainscrnur, labbenz, amphetmu, thcu, labbarb    Alcohol Level: No results found for this basename: ETH,  in the last 72 hours Urinalysis: No results found for this basename: COLORURINE, APPERANCEUR, LABSPEC, PHURINE, GLUCOSEU, HGBUR, BILIRUBINUR, KETONESUR, PROTEINUR, UROBILINOGEN, NITRITE, LEUKOCYTESUR,  in the last 72 hours Misc. Labs:  ABGS No results found for this basename: PHART, PCO2, PO2ART, TCO2, HCO3,  in the last 72 hours CULTURES No results found for this or any previous visit (from the past 240 hour(s)). Studies/Results: No results found.  Medications:  Prior to Admission:  Prescriptions prior to admission  Medication Sig Dispense Refill  . aspirin EC 325 MG tablet Take 325 mg by mouth daily.      Marland Kitchen atorvastatin (LIPITOR) 80 MG tablet Take 80 mg by mouth daily.      Marland Kitchen  carvedilol (COREG) 25 MG tablet Take 25 mg by mouth 2 (two) times daily with a meal.      . cloNIDine (CATAPRES - DOSED IN MG/24 HR) 0.2 mg/24hr patch Place 0.2 mg onto the skin once a week.      . clopidogrel (PLAVIX) 75 MG tablet Take 75 mg by mouth daily with breakfast.      . ferrous sulfate 325 (65 FE) MG tablet Take 325 mg by mouth daily with breakfast.      . furosemide (LASIX) 80 MG tablet Take 80 mg by mouth 2 (two) times daily.      . insulin lispro (HUMALOG) 100 UNIT/ML injection  Inject 2-6 Units into the skin 3 (three) times daily with meals.      Marland Kitchen lisinopril (PRINIVIL,ZESTRIL) 20 MG tablet Take 20 mg by mouth daily.      Marland Kitchen spironolactone (ALDACTONE) 25 MG tablet Take 25 mg by mouth daily.      . traMADol (ULTRAM) 50 MG tablet Take 50 mg by mouth every 6 (six) hours as needed for moderate pain.       Scheduled: . albuterol  2.5 mg Nebulization BID  . aspirin EC  325 mg Oral Daily  . atorvastatin  80 mg Oral Daily  . carvedilol  25 mg Oral BID WC  . [START ON 11/05/2013] cloNIDine  0.2 mg Transdermal Weekly  . clopidogrel  75 mg Oral Q breakfast  . furosemide  40 mg Intravenous Q12H  . heparin  5,000 Units Subcutaneous 3 times per day  . insulin aspart  0-15 Units Subcutaneous TID WC  . insulin aspart  0-5 Units Subcutaneous QHS  . iron polysaccharides  150 mg Oral Daily  . levofloxacin  500 mg Oral Q48H  . pantoprazole  40 mg Oral Daily  . pneumococcal 23 valent vaccine  0.5 mL Intramuscular Tomorrow-1000   Continuous: . sodium chloride 100 mL/hr at 10/26/13 2333   [id number], acetaminophen, albuterol, benzonatate, guaiFENesin-codeine, ondansetron (ZOFRAN) IV, ondansetron, traMADol  Assesment: He was admitted with cough that I think was probably related to ACE inhibitor. He has acute on chronic systolic heart failure. He has chronic kidney disease which seems pretty stable at this point. His heart failure has improved. He has diabetes which is unchanged Principal Problem:   Cough Active Problems:   CHF (congestive heart failure), NYHA class III   CKD (chronic kidney disease)   Type II or unspecified type diabetes mellitus without mention of complication, not stated as uncontrolled   Essential hypertension, benign   Hyperkalemia   CAD (coronary artery disease)   History of stroke   Anemia of chronic kidney failure   Other and unspecified hyperlipidemia   Acute on chronic systolic heart failure    Plan: Continue current treatments. Reevaluation  by cardiology in the morning and potential discharge tomorrow pending their reevaluation    LOS: 4 days   [name] 10/27/2013, 8:42 AM

## 2013-10-27 NOTE — Progress Notes (Signed)
Subjective: Interval History: He offers no complaint  Objective: Vital signs in last 24 hours: Temp:  [98 F (36.7 C)] 98 F (36.7 C) (05/31 [id number]) Pulse Rate:  [68-69] 69 (05/31 0632) Resp:  [16] 16 (05/31 0632) BP: (122-124)/(81-84) 124/84 mmHg (05/31 0632) SpO2:  [96 %-99 %] 96 % (05/31 0650) Weight:  [64.547 kg (142 lb 4.8 oz)] 64.547 kg (142 lb 4.8 oz) (05/31 [id number]) Weight change: -0.053 kg (-1.9 oz)  Intake/Output from previous day: 05/30 0701 - 05/31 0700 In: 2550 [P.O.:960; I.V.:1590] Out: 2000 [Urine:2000] Intake/Output this shift:    General appearance: alert, cooperative and no distress Resp: clear to auscultation bilaterally Cardio: regular rate and rhythm, S1, S2 normal, no murmur, click, rub or gallop GI: soft, non-tender; bowel sounds normal; no masses,  no organomegaly Extremities: extremities normal, atraumatic, no cyanosis or edema  Lab Results: No results found for this basename: WBC, HGB, HCT, PLT,  in the last 72 hours BMET:   Recent Labs  10/26/13 0633 10/27/13 0558  NA 136* 137  K 3.5* 3.7  CL 103 104  CO2 21 20  GLUCOSE 86 109*  BUN 55* 47*  CREATININE 1.84* 1.65*  CALCIUM 7.9* 7.6*   No results found for this basename: PTH,  in the last 72 hours Iron Studies:   Recent Labs  10/25/13 0836  IRON 28*  TIBC 256  FERRITIN 213    Studies/Results: No results found.  I have reviewed the patient's current medications.  Assessment/Plan: Problem #1 hyperkalemia: His potassium has corrected Problem #2 renal failure: Possibly chronic. His BUN and creatinine is progressively improving. No nausea or vomitng Problem #3: Cough:Marland Kitchen Presently feels much better. Problem #4 type 2 diabetes Problem #5 hypertension: His blood pressure is reasonably controlled Problem #6 history of CVA Problem #7 anemia: His H&H is low. Problem #8 metabolic bone disease: His calcium and his phosphorus is in range  Plan:We'll continue his other treatment as  before. We'll check his basic metabolic panel We will follow patient when he is discharged as an out patient     LOS: 4 days   Kiele Heavrin S Durwood Dittus 10/27/2013,8:49 AM

## 2013-10-28 DIAGNOSIS — I10 Essential (primary) hypertension: Secondary | ICD-10-CM

## 2013-10-28 DIAGNOSIS — N039 Chronic nephritic syndrome with unspecified morphologic changes: Secondary | ICD-10-CM

## 2013-10-28 DIAGNOSIS — D631 Anemia in chronic kidney disease: Secondary | ICD-10-CM

## 2013-10-28 DIAGNOSIS — E785 Hyperlipidemia, unspecified: Secondary | ICD-10-CM

## 2013-10-28 DIAGNOSIS — E119 Type 2 diabetes mellitus without complications: Secondary | ICD-10-CM

## 2013-10-28 DIAGNOSIS — Z8673 Personal history of transient ischemic attack (TIA), and cerebral infarction without residual deficits: Secondary | ICD-10-CM

## 2013-10-28 LAB — BASIC METABOLIC PANEL
BUN: 42 mg/dL — ABNORMAL HIGH (ref 6–23)
CALCIUM: 7.6 mg/dL — AB (ref 8.4–10.5)
CHLORIDE: 106 meq/L (ref 96–112)
CO2: 21 mEq/L (ref 19–32)
CREATININE: 1.54 mg/dL — AB (ref 0.50–1.35)
GFR calc Af Amer: 54 mL/min — ABNORMAL LOW (ref >90)
GFR calc non Af Amer: 47 mL/min — ABNORMAL LOW (ref >90)
GLUCOSE: 146 mg/dL — AB (ref 70–99)
Potassium: 3.7 mEq/L (ref 3.7–5.3)
Sodium: 138 mEq/L (ref 137–147)

## 2013-10-28 LAB — GLUCOSE, CAPILLARY: Glucose-Capillary: 115 mg/dL — ABNORMAL HIGH (ref 70–99)

## 2013-10-28 MED ORDER — LEVOFLOXACIN 500 MG PO TABS: 500.0000 mg | ORAL_TABLET | ORAL | Status: DC

## 2013-10-28 MED ORDER — ALBUTEROL SULFATE (2.5 MG/3ML) 0.083% IN NEBU: 2.5000 mg | INHALATION_SOLUTION | RESPIRATORY_TRACT | Status: DC | PRN

## 2013-10-28 MED ORDER — TORSEMIDE 20 MG PO TABS
20.0000 mg | ORAL_TABLET | Freq: Two times a day (BID) | ORAL | Status: DC
  Administered 2013-10-28: 20 mg via ORAL
  Filled 2013-10-28: qty 1

## 2013-10-28 NOTE — Progress Notes (Signed)
Subjective: Interval History: Patient feels better . No difficulty in breathing > He has still cough but better Objective: Vital signs in last 24 hours: Temp:  [97.8 F (36.6 C)-98.3 F (36.8 C)] 98.3 F (36.8 C) (06/01 0500) Pulse Rate:  [68-74] 72 (06/01 0500) Resp:  [16-20] 19 (06/01 0500) BP: (99-130)/(64-87) 99/64 mmHg (06/01 0500) SpO2:  [96 %-99 %] 96 % (06/01 0727) Weight:  [66.134 kg (145 lb 12.8 oz)] 66.134 kg (145 lb 12.8 oz) (06/01 0500) Weight change: 1.588 kg (3 lb 8 oz)  Intake/Output from previous day: 05/31 0701 - 06/01 0700 In: 1680 [P.O.:480; I.V.:1200] Out: 700 [Urine:700] Intake/Output this shift:    General appearance: alert, cooperative and no distress Resp: clear to auscultation bilaterally Cardio: regular rate and rhythm, S1, S2 normal, no murmur, click, rub or gallop GI: soft, non-tender; bowel sounds normal; no masses,  no organomegaly Extremities: extremities normal, atraumatic, no cyanosis or edema  Lab Results: No results found for this basename: WBC, HGB, HCT, PLT,  in the last 72 hours BMET:   Recent Labs  10/27/13 0558 10/28/13 0539  NA 137 138  K 3.7 3.7  CL 104 106  CO2 20 21  GLUCOSE 109* 146*  BUN 47* 42*  CREATININE 1.65* 1.54*  CALCIUM 7.6* 7.6*   No results found for this basename: PTH,  in the last 72 hours Iron Studies:   Recent Labs  10/25/13 0836  IRON 28*  TIBC 256  FERRITIN 213    Studies/Results: No results found.  I have reviewed the patient's current medications.  Assessment/Plan: Problem #1 hyperkalemia: His potassium has corrected Problem #2 renal failure: Acute on chronic presently improving Problem #3: Cough:Marland Kitchen Presently feels much better. Problem #4 type 2 diabetes Problem #5 hypertension: His blood pressure is well controlled Problem #6 history of CVA Problem #7 anemia: His H&H is low but stable. Started on po iron suppement Problem #8 metabolic bone disease: His calcium and his phosphorus is in  range  Plan: Decrease his ivf to 50 cc/hr D/c Demadex We will follow patient when he is discharged as an out patient     LOS: 5 days   Harriett Sine 10/28/2013,8:24 AM

## 2013-10-28 NOTE — Progress Notes (Signed)
Pt discharged back to [facility] per Dr. Luan Pulling. Pt's IV site D/C'd and WNL. Pt's VSS. Caregiver/Relative provided with home medication list, discharge instructions, prescriptions, and FL2 packet.  Verbalized understanding. Heart Failure education gone over with Caregiver/Relative. Verbalized understanding.  Pt left floor via WC in stable condition accompanied by NT.

## 2013-10-28 NOTE — Progress Notes (Signed)
The patient was seen and examined, and I agree with the assessment and plan as documented above, with modifications as noted below. Pt admitted with low output heart failure, reportedly nonischemic in etiology. Agree with transition back to oral diuretics with continuation of Coreg. Agree with holding Aldactone. I would recommend having him also evaluated in the advanced heart failure clinic with Dr. Haroldine Laws or Dr. Aundra Dubin in [place] in the near future.

## 2013-10-28 NOTE — Progress Notes (Signed)
Consulting cardiologist: Kate Sable MD Primary Cardiologist: McDowell,Samuel MD  Subjective:    Feels better, and is breathing better. Wants to go home. Sister is main care-giver.   Objective:   Temp:  [97.8 F (36.6 C)-98.3 F (36.8 C)] 98.3 F (36.8 C) (06/01 0500) Pulse Rate:  [68-74] 72 (06/01 0500) Resp:  [16-20] 19 (06/01 0500) BP: (99-130)/(64-87) 99/64 mmHg (06/01 0500) SpO2:  [96 %-99 %] 96 % (06/01 0727) Weight:  [145 lb 12.8 oz (66.134 kg)] 145 lb 12.8 oz (66.134 kg) (06/01 0500) Last BM Date: 10/25/13  Filed Weights   10/26/13 0700 10/27/13 0632 10/28/13 0500  Weight: 142 lb 6.7 oz (64.6 kg) 142 lb 4.8 oz (64.547 kg) 145 lb 12.8 oz (66.134 kg)    Intake/Output Summary (Last 24 hours) at 10/28/13 0938 Last data filed at 10/28/13 0547  Gross per 24 hour  Intake   1440 ml  Output    700 ml  Net    740 ml    Telemetry: NSR in the 70's.   Exam:  General: No acute distress.  HEENT: Conjunctiva and lids normal, oropharynx clear.  Lungs: Bilateral crackles to the bases. Some cleared with coughing. No wheezes.  Cardiac: No elevated JVP or bruits. RRR, no gallop or rub.   Abdomen: Normoactive bowel sounds, nontender, nondistended.  Extremities: No pitting edema, but puffiness noted bilaterally with feet elevated. distal pulses diminished  Neuropsychiatric: Alert and oriented x3, affect appropriate.   Lab Results:  Basic Metabolic Panel:  Recent Labs Lab 10/26/13 0633 10/27/13 0558 10/28/13 0539  NA 136* 137 138  K 3.5* 3.7 3.7  CL 103 104 106  CO2 21 20 21   GLUCOSE 86 109* 146*  BUN 55* 47* 42*  CREATININE 1.84* 1.65* 1.54*  CALCIUM 7.9* 7.6* 7.6*    Liver Function Tests:  Recent Labs Lab 10/23/13 1502  AST 49*  ALT 46  ALKPHOS 265*  BILITOT 1.0  PROT 8.6*  ALBUMIN 2.5*    CBC:  Recent Labs Lab 10/23/13 1502 10/24/13 0548  WBC 2.6* 3.2*  HGB 9.2* 8.9*  HCT 29.8* 28.9*  MCV 87.9 87.0  PLT 264 259    Cardiac  Enzymes:  Recent Labs Lab 10/23/13 1502  TROPONINI <0.30    BNP:  Recent Labs  10/23/13 1502  PROBNP 42136.0*   Echocardiogram 10/24/2013 Moderate LV dilatation with mild LVH and LVEF approximately 15-20% and low cardiac output. Wall motion abnormalities and abnormal septal motion as outlined. Grade 2 diastolic dysfunction with increased filling pressures. Moderate to severe left atrial enlargement. Mild mitral regurgitation. Abnormal aortic valve as defined above - cannot exclude significant aortic stenosis based on available information. Device wire in right heart. Severe RV dysfunction. Moderate tricuspid regurgitation with PASP 52 mmHg and elevated CVP.    Medications:   Scheduled Medications: . albuterol  2.5 mg Nebulization BID  . aspirin EC  325 mg Oral Daily  . atorvastatin  80 mg Oral Daily  . carvedilol  25 mg Oral BID WC  . [START ON 11/05/2013] cloNIDine  0.2 mg Transdermal Weekly  . clopidogrel  75 mg Oral Q breakfast  . furosemide  40 mg Intravenous Q12H  . heparin  5,000 Units Subcutaneous 3 times per day  . insulin aspart  0-15 Units Subcutaneous TID WC  . insulin aspart  0-5 Units Subcutaneous QHS  . iron polysaccharides  150 mg Oral Daily  . levofloxacin  500 mg Oral Q48H  . pantoprazole  40 mg Oral  Daily  . pneumococcal 23 valent vaccine  0.5 mL Intramuscular Tomorrow-1000    Infusions: . 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 Domenic Polite on 10/24/2013.  He has been taken off ACE inhibitor secondary to chronic dry cough. He remains on IV lasix and is positive on his I/O and wts although his breathing status is improved. Creatinine is 1.54 which is improved  from 1. 84 on admission. 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   I will transition him to PO torsemide 20 mg BID, continue carvedilol 25 mg BID, consider ARB on follow up to see if this will cause coughing, and ascertain class affect of ACE. He is not coughing now after stopping ACE. He is not placed on spironolactone due to renal insufficiency His sister is a SNF home provider who is main caregiver.  He is counseled on low sodium diet.  BP is low normal.   He is followed by [facility], by Dr. Nunzio Cobbs but wishes to be established with our practice. Appt has been made in our [place] off for establishment.   2. Hypertension: BP is low normal. He remains on clonidine 0.2 mg patch along with carvedilol. He is to avoid salty foods.   3. S/P ICD placement in 2014: Will need to have establishment with EP on follow up to be have PPM interrogated. He is a former patient of [facility] in [place] by Dr. Nunzio Cobbs. He has follow up appt made in June but wishes to be established locally.   He has appt scheduled on June 18, [employer] # [id number]. [facility] #[id number] [facility] serial number [id number]. Placed November 13, 2012. I have asked that records be sent to our office in [place] and [place] for further information to help with history and records.   4. Hx of CVA: He has a medical regimen with Plavix and ASA but not on coumadin. He has anemia, with CKD and iron deficiency.   5. Iron Deficiency Anemia: Ongoing treatment with iron replacement  6. CKD: Recommendations per Dr. Hinda Lenis.      Phill Myron. Lawrence NP  10/28/2013, 9:38 AM

## 2013-10-28 NOTE — Discharge Summary (Signed)
Physician Discharge Summary  Patient ID: Keith Young MRN: [id number] DOB/AGE: [birth year] 63 y.o. Primary Care Physician:No primary provider on file. Admit date: 10/23/2013 Discharge date: 10/28/2013    Discharge Diagnoses:   Principal Problem:   Cough Active Problems:   CHF (congestive heart failure), NYHA class III   CKD (chronic kidney disease)   Type II or unspecified type diabetes mellitus without mention of complication, not stated as uncontrolled   Essential hypertension, benign   Hyperkalemia   CAD (coronary artery disease)   History of stroke   Anemia of chronic kidney failure   Other and unspecified hyperlipidemia   Acute on chronic systolic heart failure     Medication List    STOP taking these medications       lisinopril 20 MG tablet  Commonly known as:  PRINIVIL,ZESTRIL     spironolactone 25 MG tablet  Commonly known as:  ALDACTONE      TAKE these medications       albuterol (2.5 MG/3ML) 0.083% nebulizer solution  Commonly known as:  PROVENTIL  Take 3 mLs (2.5 mg total) by nebulization every 4 (four) hours as needed for wheezing or shortness of breath.     aspirin EC 325 MG tablet  Take 325 mg by mouth daily.     atorvastatin 80 MG tablet  Commonly known as:  LIPITOR  Take 80 mg by mouth daily.     carvedilol 25 MG tablet  Commonly known as:  COREG  Take 25 mg by mouth 2 (two) times daily with a meal.     cloNIDine 0.2 mg/24hr patch  Commonly known as:  CATAPRES - Dosed in mg/24 hr  Place 0.2 mg onto the skin once a week.     clopidogrel 75 MG tablet  Commonly known as:  PLAVIX  Take 75 mg by mouth daily with breakfast.     ferrous sulfate 325 (65 FE) MG tablet  Take 325 mg by mouth daily with breakfast.     furosemide 80 MG tablet  Commonly known as:  LASIX  Take 80 mg by mouth 2 (two) times daily.     insulin lispro 100 UNIT/ML injection  Commonly known as:  HUMALOG  Inject 2-6 Units into the skin 3 (three) times daily with meals.      levofloxacin 500 MG tablet  Commonly known as:  LEVAQUIN  Take 1 tablet (500 mg total) by mouth every other day.     traMADol 50 MG tablet  Commonly known as:  ULTRAM  Take 50 mg by mouth every 6 (six) hours as needed for moderate pain.        Discharged Condition: Improved    Consults: Cardiology neurology nephrology  Significant Diagnostic Studies: Dg Chest 2 View  10/23/2013   CLINICAL DATA:  Cough for 2 months. Resident of the adult family care home.  EXAM: CHEST  2 VIEW  COMPARISON:  None.  FINDINGS: Left subclavian pacemaker lead is present at the right ventricular apex. The heart is mildly enlarged status post median sternotomy. The lungs demonstrate mild interstitial prominence and central airway thickening. There is no confluent airspace opacity or definite edema. There is mild fissural thickening without pleural effusion.  IMPRESSION: The cardiomegaly with interstitial prominence and central airway thickening. No overt pulmonary edema or confluent airspace opacity.   Electronically Signed   By: Camie Patience M.D.   On: 10/23/2013 13:42    Lab Results: Basic Metabolic Panel:  Recent Labs  10/27/13 0558 10/28/13 0539  NA 137 138  K 3.7 3.7  CL 104 106  CO2 20 21  GLUCOSE 109* 146*  BUN 47* 42*  CREATININE 1.65* 1.54*  CALCIUM 7.6* 7.6*  PHOS 2.5  --    Liver Function Tests: No results found for this basename: AST, ALT, ALKPHOS, BILITOT, PROT, ALBUMIN,  in the last 72 hours   CBC: No results found for this basename: WBC, NEUTROABS, HGB, HCT, MCV, PLT,  in the last 72 hours  No results found for this or any previous visit (from the past 240 hour(s)).   Hospital Course: This is a 63 year old who had been in the hospital in [facility] and at discharge was sent to an assisted living facility here. He had done fairly well then developed a severe cough. He was brought to the emergency department where he was found to have cough. He also had what  appeared to be some acute on chronic congestive heart failure. He has renal failure. He's had a previous stroke. He has COPD. He was treated with diuretics and improved. He was hyperkalemic on admission and that improved. Consultation was obtained with cardiology and he will have followup with them, with nephrology and he will have followup with them and with urology. Was felt that he did not have an acute event during this hospitalization. By the time of discharge he said he felt well his breathing was better he had less cough area his cough was thought to be related to ACE inhibitor and that has been discontinued.  Discharge Exam: Blood pressure 99/64, pulse 72, temperature 98.3 F (36.8 C), temperature source Oral, resp. rate 19, height 5\' 2"  (1.575 m), weight 66.134 kg (145 lb 12.8 oz), SpO2 96.00%. He is awake and alert. His chest is clear. His heart is regular. His abdomen is soft.  Disposition: Home with home health services      Discharge Instructions   DME Nebulizer machine    Complete by:  As directed      Face-to-face encounter (required for Medicare/Medicaid patients)    Complete by:  As directed   I [name] certify that this patient is under my care and that I, or a nurse practitioner or physician's assistant working with me, had a face-to-face encounter that meets the physician face-to-face encounter requirements with this patient on 10/28/2013. The encounter with the patient was in whole, or in part for the following medical condition(s) which is the primary reason for home health care (List medical condition): chf/cough  The encounter with the patient was in whole, or in part, for the following medical condition, which is the primary reason for home health care:  chf/cough  I certify that, based on my findings, the following services are medically necessary home health services:   Nursing Physical therapy    My clinical findings support the need for the above services:   Shortness of breath with activity  Further, I certify that my clinical findings support that this patient is homebound due to:  Shortness of Breath with activity  Reason for Medically Necessary Home Health Services:  Skilled Nursing- Change/Decline in Patient Status     Home Health    Complete by:  As directed   To provide the following care/treatments:   RN PT             Follow-up Information   Follow up with [facility].   Contact information:   [address] [place] [address] [phone]  Follow up with [facility], MD In 4 weeks.   Specialty:  Nephrology   Contact information:   [address] [place] [address] [phone]       Signed: [name]   10/28/2013, 8:52 AM

## 2013-10-28 NOTE — Progress Notes (Signed)
Subjective: He says he feels better. He denies cough to me. He's not short of breath. He's not had any numbness.  Objective: Vital signs in last 24 hours: Temp:  [97.8 F (36.6 C)-98.3 F (36.8 C)] 98.3 F (36.8 C) (06/01 0500) Pulse Rate:  [68-74] 72 (06/01 0500) Resp:  [16-20] 19 (06/01 0500) BP: (99-130)/(64-87) 99/64 mmHg (06/01 0500) SpO2:  [96 %-99 %] 96 % (06/01 0727) Weight:  [66.134 kg (145 lb 12.8 oz)] 66.134 kg (145 lb 12.8 oz) (06/01 0500) Weight change: 1.588 kg (3 lb 8 oz) Last BM Date: 10/25/13  Intake/Output from previous day: 05/31 0701 - 06/01 0700 In: 1680 [P.O.:480; I.V.:1200] Out: 700 [Urine:700]  PHYSICAL EXAM General appearance: alert, cooperative and no distress Resp: clear to auscultation bilaterally Cardio: regular rate and rhythm, S1, S2 normal, no murmur, click, rub or gallop GI: soft, non-tender; bowel sounds normal; no masses,  no organomegaly Extremities: extremities normal, atraumatic, no cyanosis or edema  Lab Results:    Basic Metabolic Panel:  Recent Labs  10/27/13 0558 10/28/13 0539  NA 137 138  K 3.7 3.7  CL 104 106  CO2 20 21  GLUCOSE 109* 146*  BUN 47* 42*  CREATININE 1.65* 1.54*  CALCIUM 7.6* 7.6*  PHOS 2.5  --    Liver Function Tests: No results found for this basename: AST, ALT, ALKPHOS, BILITOT, PROT, ALBUMIN,  in the last 72 hours No results found for this basename: LIPASE, AMYLASE,  in the last 72 hours No results found for this basename: AMMONIA,  in the last 72 hours CBC: No results found for this basename: WBC, NEUTROABS, HGB, HCT, MCV, PLT,  in the last 72 hours Cardiac Enzymes: No results found for this basename: CKTOTAL, CKMB, CKMBINDEX, TROPONINI,  in the last 72 hours BNP: No results found for this basename: PROBNP,  in the last 72 hours D-Dimer: No results found for this basename: DDIMER,  in the last 72 hours CBG:  Recent Labs  10/26/13 2132 10/27/13 0727 10/27/13 1122 10/27/13 1614  10/27/13 2043 10/28/13 0722  GLUCAP 119* 120* 189* 156* 158* 115*   Hemoglobin A1C: No results found for this basename: HGBA1C,  in the last 72 hours Fasting Lipid Panel: No results found for this basename: CHOL, HDL, LDLCALC, TRIG, CHOLHDL, LDLDIRECT,  in the last 72 hours Thyroid Function Tests: No results found for this basename: TSH, T4TOTAL, FREET4, T3FREE, THYROIDAB,  in the last 72 hours Anemia Panel: No results found for this basename: VITAMINB12, FOLATE, FERRITIN, TIBC, IRON, RETICCTPCT,  in the last 72 hours Coagulation: No results found for this basename: LABPROT, INR,  in the last 72 hours Urine Drug Screen: Drugs of Abuse  No results found for this basename: labopia, cocainscrnur, labbenz, amphetmu, thcu, labbarb    Alcohol Level: No results found for this basename: ETH,  in the last 72 hours Urinalysis: No results found for this basename: COLORURINE, APPERANCEUR, LABSPEC, PHURINE, GLUCOSEU, HGBUR, BILIRUBINUR, KETONESUR, PROTEINUR, UROBILINOGEN, NITRITE, LEUKOCYTESUR,  in the last 72 hours Misc. Labs:  ABGS No results found for this basename: PHART, PCO2, PO2ART, TCO2, HCO3,  in the last 72 hours CULTURES No results found for this or any previous visit (from the past 240 hour(s)). Studies/Results: No results found.  Medications:  Prior to Admission:  Prescriptions prior to admission  Medication Sig Dispense Refill  . aspirin EC 325 MG tablet Take 325 mg by mouth daily.      Marland Kitchen atorvastatin (LIPITOR) 80 MG tablet Take 80 mg by  mouth daily.      . carvedilol (COREG) 25 MG tablet Take 25 mg by mouth 2 (two) times daily with a meal.      . cloNIDine (CATAPRES - DOSED IN MG/24 HR) 0.2 mg/24hr patch Place 0.2 mg onto the skin once a week.      . clopidogrel (PLAVIX) 75 MG tablet Take 75 mg by mouth daily with breakfast.      . ferrous sulfate 325 (65 FE) MG tablet Take 325 mg by mouth daily with breakfast.      . furosemide (LASIX) 80 MG tablet Take 80 mg by mouth 2  (two) times daily.      . insulin lispro (HUMALOG) 100 UNIT/ML injection Inject 2-6 Units into the skin 3 (three) times daily with meals.      Marland Kitchen lisinopril (PRINIVIL,ZESTRIL) 20 MG tablet Take 20 mg by mouth daily.      Marland Kitchen spironolactone (ALDACTONE) 25 MG tablet Take 25 mg by mouth daily.      . traMADol (ULTRAM) 50 MG tablet Take 50 mg by mouth every 6 (six) hours as needed for moderate pain.       Scheduled: . albuterol  2.5 mg Nebulization BID  . aspirin EC  325 mg Oral Daily  . atorvastatin  80 mg Oral Daily  . carvedilol  25 mg Oral BID WC  . [START ON 11/05/2013] cloNIDine  0.2 mg Transdermal Weekly  . clopidogrel  75 mg Oral Q breakfast  . furosemide  40 mg Intravenous Q12H  . heparin  5,000 Units Subcutaneous 3 times per day  . insulin aspart  0-15 Units Subcutaneous TID WC  . insulin aspart  0-5 Units Subcutaneous QHS  . iron polysaccharides  150 mg Oral Daily  . levofloxacin  500 mg Oral Q48H  . pantoprazole  40 mg Oral Daily  . pneumococcal 23 valent vaccine  0.5 mL Intramuscular Tomorrow-1000   Continuous: . sodium chloride 100 mL/hr at 10/28/13 0547   [id number], acetaminophen, albuterol, benzonatate, ondansetron (ZOFRAN) IV, ondansetron, traMADol  Assesment: He was admitted with cough which may have been related to ACE inhibitor and has largely cleared. He has multiple other medical problems including coronary artery occlusive disease but that seems pretty stable now. He had a previous stroke and complained of some numbness while he was here but that has resolved. He has chronic kidney disease which is stable. He has congestive heart failure multifactorial acute on chronic and that seems better. He has anemia of chronic disease which is stable. Principal Problem:   Cough Active Problems:   CHF (congestive heart failure), NYHA class III   CKD (chronic kidney disease)   Type II or unspecified type diabetes mellitus without mention of complication, not stated as  uncontrolled   Essential hypertension, benign   Hyperkalemia   CAD (coronary artery disease)   History of stroke   Anemia of chronic kidney failure   Other and unspecified hyperlipidemia   Acute on chronic systolic heart failure    Plan: I think he can probably be discharged but will clear with cardiology    LOS: 5 days   [name] 10/28/2013, 8:45 AM

## 2013-10-28 NOTE — Progress Notes (Signed)
UR chart review completed.  

## 2013-10-28 NOTE — Clinical Social Work Note (Signed)
Patient ready for discharge today, will return to [facility] via facility transport.  FL2 reviewed w RN and updated as needed.  Discharge packet prepared and placed w shadow chart for transport.  Spoke w Jeronimo Greaves, administrator for [facility].  Requested hospital bed for patient, says patient said "I would sleep better if I had one."  Patient and facility all agreeable to patient transfer to [facility] today.  Sister, Rosezella [place], notified by patient.  CSW spoke w brother in Youth worker.  RN CM arranging [facility] needs.  CSW signing off as no further SW needs identified.  Edwyna Shell, LCSW Clinical Social Worker [phone])

## 2013-11-05 ENCOUNTER — Ambulatory Visit (INDEPENDENT_AMBULATORY_CARE_PROVIDER_SITE_OTHER): Payer: Medicare HMO | Admitting: Cardiology

## 2013-11-05 ENCOUNTER — Encounter: Payer: Self-pay | Admitting: Cardiology

## 2013-11-05 VITALS — BP 122/78 | HR 69 | Ht 62.0 in | Wt 132.0 lb

## 2013-11-05 DIAGNOSIS — N189 Chronic kidney disease, unspecified: Secondary | ICD-10-CM

## 2013-11-05 DIAGNOSIS — I10 Essential (primary) hypertension: Secondary | ICD-10-CM

## 2013-11-05 DIAGNOSIS — I5022 Chronic systolic (congestive) heart failure: Secondary | ICD-10-CM

## 2013-11-05 DIAGNOSIS — D631 Anemia in chronic kidney disease: Secondary | ICD-10-CM

## 2013-11-05 DIAGNOSIS — N039 Chronic nephritic syndrome with unspecified morphologic changes: Secondary | ICD-10-CM

## 2013-11-05 MED ORDER — LOSARTAN POTASSIUM 25 MG PO TABS: 25.0000 mg | ORAL_TABLET | Freq: Every day | ORAL | Status: DC

## 2013-11-05 MED ORDER — CLONIDINE HCL 0.2 MG/24HR TD PTWK: 0.2000 mg | MEDICATED_PATCH | TRANSDERMAL | Status: DC

## 2013-11-05 NOTE — Progress Notes (Signed)
Clinical Summary Keith Young is a 63 y.o.male seen today for follow up of the following medical problems. This is our first visit together.   1. Chronic systolic heart failure/NICM - recent admit 10/23/13 with chief complaint cough, at that time also found to be volume overloaded.  - most of his previous care had been at [facility] in [place].  - from notes apparently had cath in 2014 with patent coronaries, he has prior ICD placed but has not been checked in several months per his report.  - Appears medical management has been limited due to medication non-compliance and polysubstance abuse - echo 10/24/13 LVEF [id number] II diastolic dysfunction, multiple WMAs. +spontaneous echo constrast suggestive of low cardiac output. Function appears fairly stable from prior echoes from [facility]  - reports DOE at < 1/2 block which is somewhat better than before. - denies any LE edema, chronically sleeps at angle, no PND - compliant with meds - weighs himself daily, typically 143 and has been stable. - ACE stopped during admission due to concern for cough. Aldactone stopped I presume in setting of CKD.  2. CKD - discharge Cr was 1.5, K 3.7. Cr trended down during admission with diuresis.   3. Aortic stenosis - history of bioprosthetic AVR - mean grad of 10 per most recent echo report, gradient stable from prior echoes. Gradient may be underestimated due to low output  Past Medical History  Diagnosis Date  . Chronic systolic heart failure   . Type 2 diabetes mellitus   . Coronary atherosclerosis of native coronary artery   . Essential hypertension, benign   . CKD (chronic kidney disease) stage 3, GFR 30-59 ml/min   . Anemia   . Hyperlipidemia   . History of stroke   . Bell's palsy   . Cardiomyopathy   . Aortic insufficiency   . Alcoholism in remission   . Noncompliance with medication regimen   . NSVT (nonsustained ventricular tachycardia)   .  Cardiac defibrillator in situ      No Known Allergies   Current Outpatient Prescriptions  Medication Sig Dispense Refill  . albuterol (PROVENTIL) (2.5 MG/3ML) 0.083% nebulizer solution Take 3 mLs (2.5 mg total) by nebulization every 4 (four) hours as needed for wheezing or shortness of breath.  75 mL  12  . aspirin EC 325 MG tablet Take 325 mg by mouth daily.      Marland Kitchen atorvastatin (LIPITOR) 80 MG tablet Take 80 mg by mouth daily.      . carvedilol (COREG) 25 MG tablet Take 25 mg by mouth 2 (two) times daily with a meal.      . cloNIDine (CATAPRES - DOSED IN MG/24 HR) 0.2 mg/24hr patch Place 0.2 mg onto the skin once a week.      . clopidogrel (PLAVIX) 75 MG tablet Take 75 mg by mouth daily with breakfast.      . ferrous sulfate 325 (65 FE) MG tablet Take 325 mg by mouth daily with breakfast.      . furosemide (LASIX) 80 MG tablet Take 80 mg by mouth 2 (two) times daily.      . insulin lispro (HUMALOG) 100 UNIT/ML injection Inject 2-6 Units into the skin 3 (three) times daily with meals.      Marland Kitchen levofloxacin (LEVAQUIN) 500 MG tablet Take 1 tablet (500 mg total) by mouth every other day.  4 tablet  0  . traMADol (ULTRAM) 50 MG tablet Take 50 mg by  mouth every 6 (six) hours as needed for moderate pain.       No current facility-administered medications for this visit.     Past Surgical History  Procedure Laterality Date  . Pacemaker insertion    . Cardiac surgery       No Known Allergies    Family History  Problem Relation Age of Onset  . Kidney disease Mother   . Kidney disease Sister      Social History Keith Young reports that he has quit smoking. His smoking use included Cigarettes. He smoked 0.00 packs per day. He does not have any smokeless tobacco history on file. Keith Young reports that he does not drink alcohol.   Review of Systems CONSTITUTIONAL: No weight loss, fever, chills, weakness or fatigue.  HEENT: Eyes: No visual loss, blurred vision, double vision or  yellow sclerae.No hearing loss, sneezing, congestion, runny nose or sore throat.  SKIN: No rash or itching.  CARDIOVASCULAR: per HPI RESPIRATORY: No shortness of breath, cough or sputum.  GASTROINTESTINAL: No anorexia, nausea, vomiting or diarrhea. No abdominal pain or blood.  GENITOURINARY: No burning on urination, no polyuria NEUROLOGICAL: No headache, dizziness, syncope, paralysis, ataxia, numbness or tingling in the extremities. No change in bowel or bladder control.  MUSCULOSKELETAL: No muscle, back pain, joint pain or stiffness.  LYMPHATICS: No enlarged nodes. No history of splenectomy.  PSYCHIATRIC: No history of depression or anxiety.  ENDOCRINOLOGIC: No reports of sweating, cold or heat intolerance. No polyuria or polydipsia.  Marland Kitchen   Physical Examination p 69 bp 122/78 Wt 132 lbs BMI 24 Gen: resting comfortably, no acute distress HEENT: no scleral icterus, pupils equal round and reactive, no palptable cervical adenopathy,  CV: RRR, no m/r/g, no JVD, no carotid bruits Resp: Clear to auscultation bilaterally GI: abdomen is soft, non-tender, non-distended, normal bowel sounds, no hepatosplenomegaly MSK: extremities are warm, no edema.  Skin: warm, no rash Neuro:  no focal deficits Psych: appropriate affect   Diagnostic Studies 10/24/13 Echo Study Conclusions  - Left ventricle: The cavity size was moderately dilated. Wall thickness was increased in a pattern of mild LVH. Systolic function was severely reduced. The estimated ejection fraction was in the range of 15% to 20%. Spontaneous echocontrast and low stroke volume consistent with low cardiac output. There is akinesis of the anteroseptal, anterior, mid to distal inferior, inferoseptal, and apical myocardium. Features are consistent with a pseudonormal left ventricular filling pattern, with concomitant abnormal relaxation and increased filling pressure (grade 2 diastolic dysfunction). Doppler parameters are consistent  with elevated ventricular end-diastolic filling pressure. - Ventricular septum: Septal motion showed abnormal function and dyssynergy. - Aortic valve: Appears heavily calcified with poorly defined leaflet structure - cannot completely exclude stented bioprosthesis, but history at this point does not confirm this. There was possible moderate to severe stenosis, low gradient. There was no significant regurgitation. Mean gradient (S): 10 mm Hg. Peak gradient (S): 18 mm Hg. Valve area (VTI): 1.09 cm^2. Valve area (Vmax): 1 cm^2. LVOT/AV VTI ratio 0.31. - Mitral valve: Mildly thickened leaflets . There was mild regurgitation. - Left atrium: The atrium was moderately to severely dilated. - Right ventricle: The cavity size was moderately dilated. Pacer wire or catheter noted in right ventricle. Systolic function was moderately to severely reduced. - Right atrium: The atrium was mildly to moderately dilated. Central venous pressure (est): 15 mm Hg. - Tricuspid valve: There was moderate regurgitation. - Pulmonic valve: There was mild regurgitation. - Pulmonary arteries: PA peak pressure: 52 mm  Hg (S). - Pericardium, extracardiac: There was no pericardial effusion.  Impressions:  - Moderate LV dilatation with mild LVH and LVEF approximately 15-20% and low cardiac output. Wall motion abnormalities and abnormal septal motion as outlined. Grade 2 diastolic dysfunction with increased filling pressures. Moderate to severe left atrial enlargement. Mild mitral regurgitation. Abnormal aortic valve as defined above - cannot exclude significant aortic stenosis based on available information. Device wire in right heart. Severe RV dysfunction. Moderate tricuspid regurgitation with PASP 52 mmHg and elevated CVP.  ICD information He has appt scheduled on June 18, [employer] # [id number]. Lead [employer] #[id number] [facility] serial number [id number]. Placed November 13, 2012. I have asked that records  be sent to our office in [place] and [place] for further information to help with history and records.     Assessment and Plan  1. Chronic systolic heart failure/ NICM - long history with most of his management in [place], [place] at [facility] - most recent echo LVEF 15-20%, he is NYHA III, with a [employer] ICD. - will set him up with our device clinic to be followed - continue beta blocker at current dose, in setting of likely apparent very low cardiac output as well as concern about low flow low gradient stenosis across is AV avoid over aggressive beta blocker.  - ACE stopped during recent admit due to concern for cough, will start low dose ARB with repeat labs in 2 weeks. His renal function is reasonable for ARB, would avoid addition of spironolactone at this time however  2. Aortic stenosis - echo reports show stable mild gradient across tissue valve, with noted calcification and morphologically suggestion of more prominent disease. Could represent low flow low gradient AS. Will review echo in further detail  3. CKD - starting back low dose ARB, f/u BMET in 2 weeks      Arnoldo Lenis, M.D., F.A.C.C.

## 2013-11-05 NOTE — Patient Instructions (Signed)
   Begin Losartan 25mg  daily - new sent to pharm  Clonidine refill sent to pharm also Continue all other medications.   Lab for BMET - due in 2 weeks Office will contact with results via phone or letter.   Establish with our device clinic Follow up in  1 month

## 2013-11-06 ENCOUNTER — Ambulatory Visit: Payer: Self-pay | Admitting: Cardiovascular Disease

## 2013-11-19 ENCOUNTER — Other Ambulatory Visit: Payer: Self-pay | Admitting: Cardiology

## 2013-11-20 LAB — BASIC METABOLIC PANEL
BUN: 42 mg/dL — AB (ref 6–23)
CHLORIDE: 104 meq/L (ref 96–112)
CO2: 23 mEq/L (ref 19–32)
CREATININE: 1.61 mg/dL — AB (ref 0.50–1.35)
Calcium: 8.7 mg/dL (ref 8.4–10.5)
GLUCOSE: 87 mg/dL (ref 70–99)
POTASSIUM: 4.7 meq/L (ref 3.5–5.3)
Sodium: 133 mEq/L — ABNORMAL LOW (ref 135–145)

## 2013-11-27 ENCOUNTER — Encounter: Payer: Self-pay | Admitting: Internal Medicine

## 2013-11-27 ENCOUNTER — Ambulatory Visit (INDEPENDENT_AMBULATORY_CARE_PROVIDER_SITE_OTHER): Payer: Medicare HMO | Admitting: Internal Medicine

## 2013-11-27 ENCOUNTER — Telehealth: Payer: Self-pay | Admitting: *Deleted

## 2013-11-27 VITALS — BP 108/73 | HR 67 | Ht 62.0 in | Wt 123.0 lb

## 2013-11-27 DIAGNOSIS — I10 Essential (primary) hypertension: Secondary | ICD-10-CM

## 2013-11-27 DIAGNOSIS — N183 Chronic kidney disease, stage 3 unspecified: Secondary | ICD-10-CM

## 2013-11-27 DIAGNOSIS — I5020 Unspecified systolic (congestive) heart failure: Secondary | ICD-10-CM

## 2013-11-27 DIAGNOSIS — I509 Heart failure, unspecified: Secondary | ICD-10-CM

## 2013-11-27 NOTE — Patient Instructions (Signed)
Remote monitoring is used to monitor your Pacemaker or ICD from home. This monitoring reduces the number of office visits required to check your device to one time per year. It allows [place] to keep an eye on the functioning of your device to ensure it is working properly. You are scheduled for a device check from home on 03/03/14  . You may send your transmission at any time that day. If you have a wireless device, the transmission will be sent automatically. After your physician reviews your transmission, you will receive a postcard with your next transmission date.    Your physician wants you to follow-up in: 12 months with Dr Lovena Le in [place] will receive a reminder letter in the mail two months in advance. If you don't receive a letter, please call our office to schedule the follow-up appointment.

## 2013-11-27 NOTE — Telephone Encounter (Signed)
Message copied by Laurine Blazer on Wed Nov 27, 2013  4:11 PM ------      Message from: Bivins F      Created: Thu Nov 21, 2013 11:27 AM       Labs look good            Zandra Abts MD ------

## 2013-11-27 NOTE — Telephone Encounter (Signed)
Notes Recorded by Laurine Blazer, LPN on [id number] at 075-GRM PM Patient notified. Already has follow up scheduled for 12/05/2013 with Dr. Harl Bowie.

## 2013-12-02 ENCOUNTER — Encounter: Payer: Self-pay | Admitting: Internal Medicine

## 2013-12-02 LAB — MDC_IDC_ENUM_SESS_TYPE_INCLINIC
HIGH POWER IMPEDANCE MEASURED VALUE: 51 Ohm
Implantable Pulse Generator Serial Number: 436636
Lead Channel Impedance Value: 380 Ohm
Lead Channel Pacing Threshold Amplitude: 3 V
Lead Channel Pacing Threshold Pulse Width: 0.3 ms
Lead Channel Sensing Intrinsic Amplitude: 7.7 mV
MDC IDC SET LEADCHNL RV PACING AMPLITUDE: 3 V
MDC IDC SET LEADCHNL RV PACING PULSEWIDTH: 1.2 ms

## 2013-12-02 NOTE — Progress Notes (Signed)
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  He has chronic renal insufficiency and NYHA Class IIII CHF with EF 15-20%.  He is quite thin and frail and has a h/o heavy ETOH.  He has recently moved from [place] (where his ICD was placed) to [place] to be near his family.  Noncompliance has been a real issue in the past.  He has had multiple hospitalizations for CHF.  He continues to make very slow improvements with medical therapy.  Recently, he has had dizziness and fatigue and hypotension has been noted.  He has chronic difficulty with orthopnea/ PND.  He is SOB with < 1/2 block of ambulation chronically.  Today, he  denies symptoms of palpitations, chest pain,  lower extremity edema, presyncope, syncope, or neurologic sequela.  The patientis tolerating medications without difficulties and is otherwise without complaint today.   Past Medical History  Diagnosis Date  . Chronic systolic heart failure     NYHA Class III  . Type 2 diabetes mellitus   . Nonischemic cardiomyopathy     EF 15-20%  . Essential hypertension, benign   . CKD (chronic kidney disease) stage 3, GFR 30-59 ml/min   . Anemia   . Hyperlipidemia   . History of stroke   . Bell's palsy   . Cardiomyopathy   . Aortic insufficiency   . Alcoholism in remission   . Noncompliance with medication regimen   . NSVT (nonsustained ventricular tachycardia)   . Cardiac defibrillator in situ   . COPD (chronic obstructive pulmonary disease)   . CVA (cerebral infarction) 01/2013  . Productive cough 08/2013    With brown sputum   . SOB (shortness of breath) on exertion 08/2013  . Numbness of right jaw     Had a stoke in 01/2013. Numbness is occasional, especially when trying to chew.  . At moderate risk for fall      Past Surgical History  Procedure Laterality Date  . Cardiac defibrillator placement  11/12/12    [employer] Inogen MINI ICD implanted in [place] at [facility]  . Cardiac surgery      bioprostetic AVR per Dr Nelly Laurence note    History   Social History  . Marital Status: Single    Spouse Name: N/A    Number of Children: N/A  . Years of Education: N/A   Occupational History  . Not on file.   Social History Main Topics  . Smoking status: Former Smoker    Types: Cigarettes    Quit date: 11/05/1993  . Smokeless tobacco: Never Used  . Alcohol Use: Yes     Comment: former heavy ETOH, none recently  . Drug Use: No  . Sexual Activity: Not on file   Other Topics Concern  . Not on file   Social History Narrative   Recently moved from [place] to be with family in [place]                Family History  Problem Relation Age of Onset  . Kidney disease Mother   . Kidney disease Sister     No Known Allergies  Current Outpatient Prescriptions  Medication Sig Dispense Refill  . albuterol (PROVENTIL) (2.5 MG/3ML) 0.083% nebulizer solution Take 3 mLs (2.5 mg total) by nebulization  every 4 (four) hours as needed for wheezing or shortness of breath.  75 mL  12  . aspirin 325 MG EC tablet Take 325 mg by mouth daily.      Marland Kitchen atorvastatin (LIPITOR) 80 MG tablet Take 80 mg by mouth daily.      . carvedilol (COREG) 25 MG tablet Take 25 mg by mouth 2 (two) times daily with a meal.       . cloNIDine (CATAPRES - DOSED IN MG/24 HR) 0.2 mg/24hr patch Place 1 patch (0.2 mg total) onto the skin once a week.  4 patch  6  . clopidogrel (PLAVIX) 75 MG tablet Take 75 mg by mouth daily with breakfast.      . ferrous sulfate 325 (65 FE) MG tablet Take 325 mg by mouth daily with breakfast.      . furosemide (LASIX) 80 MG tablet Take 80 mg by mouth 2 (two) times daily.       . insulin lispro (HUMALOG) 100 UNIT/ML injection Inject 2-6 Units into the skin 3 (three) times daily  with meals.      Marland Kitchen levofloxacin (LEVAQUIN) 500 MG tablet Take 1 tablet (500 mg total) by mouth every other day.  4 tablet  0  . losartan (COZAAR) 25 MG tablet Take 1 tablet (25 mg total) by mouth daily.  30 tablet  6  . potassium chloride SA (K-DUR,KLOR-CON) 20 MEQ tablet Take 20 mEq by mouth daily.      . traMADol (ULTRAM) 50 MG tablet Take 50 mg by mouth every 6 (six) hours as needed.       No current facility-administered medications for this visit.    ROS- all systems are reviewed and negative except as per HPI  Physical Exam: Filed Vitals:   11/27/13 1206  BP: 108/73  Pulse: 67  Height: 5\' 2"  (1.575 m)  Weight: 123 lb (55.792 kg)    GEN- The patient is thin and frail appearing, alert and oriented x 3 today.   Head- normocephalic, atraumatic Eyes-  Sclera clear, conjunctiva pink Ears- hearing intact Oropharynx- clear Neck- supple  Lungs- Clear to ausculation bilaterally, normal work of breathing Chest- ICD pocket is well healed Heart- Regular rate and rhythm  GI- soft, NT, ND, + BS Extremities- no clubbing, cyanosis, or edema MS- diffuse muscle atrophy Skin- no rash or lesion Psych- euthymic mood, full affect Neuro- strength and sensation are intact  ICD interrogation- reviewed in detail today,  See PACEART report Epic records including Dr Percell Locus notes are reviewed  Assessment and Plan:  1. Chronic systolic dysfunction The patient has advanced CHF and has been treated with medical therapy.  ETOH cessation is certainly necessary.  He will continue to follow closely with Dr Harl Bowie.  His QRS is narrow and he does not frequently V pace.  He is therefore not a candidate for CRT. Normal ICD function See Claudia Desanctis Art report No changes today We will set up remote monitoring.  Importance of remote monitoring long term was discussed today.  2. htn Stable No change required today  3. CRI Stable No change required today  [place] every 3 months Return to see Dr Larae Grooms in  the [place] EP clinic in 1 year (Pts family request device follow-up in [place] rather than [place] as it is closer to their home).

## 2013-12-05 ENCOUNTER — Ambulatory Visit (INDEPENDENT_AMBULATORY_CARE_PROVIDER_SITE_OTHER): Payer: Medicare HMO | Admitting: Cardiology

## 2013-12-05 ENCOUNTER — Encounter: Payer: Self-pay | Admitting: Cardiology

## 2013-12-05 VITALS — BP 134/81 | HR 69 | Ht 62.0 in | Wt 127.0 lb

## 2013-12-05 DIAGNOSIS — I10 Essential (primary) hypertension: Secondary | ICD-10-CM

## 2013-12-05 DIAGNOSIS — I359 Nonrheumatic aortic valve disorder, unspecified: Secondary | ICD-10-CM

## 2013-12-05 DIAGNOSIS — I5022 Chronic systolic (congestive) heart failure: Secondary | ICD-10-CM

## 2013-12-05 DIAGNOSIS — G4733 Obstructive sleep apnea (adult) (pediatric): Secondary | ICD-10-CM

## 2013-12-05 DIAGNOSIS — I350 Nonrheumatic aortic (valve) stenosis: Secondary | ICD-10-CM

## 2013-12-05 MED ORDER — CLONIDINE HCL 0.1 MG/24HR TD PTWK: 0.1000 mg | MEDICATED_PATCH | TRANSDERMAL | Status: DC

## 2013-12-05 NOTE — Progress Notes (Signed)
Clinical Summary Keith Young is a 63 y.o.male seen today for follow up of the following medical problems. He has a complex cardiac history previously followed at [facility], this is our second visit together.   1. Chronic systolic heart failure/NICM  - most of his previous care had been at [facility] in [place].  - echo 10/24/13 LVEF [id number] II diastolic dysfunction, multiple WMAs. +spontaneous echo constrast suggestive of low cardiac output. Function appears fairly stable from prior echoes from [facility]  - cath at [facility] (in epic media section) 10/2012 with patent coronaries.  - Appears medical management has been limited due to medication non-compliance and polysubstance abuse - he has prior [employer] ICD placed at [facility],  seen by EP just a few days ago, normal function on interrogation   - reports DOE at 1 block which somewhat better than before. No LE edema, no orthopnea, no PND. No chest pain - limiting sodium intake, avoiding NSAIDs - weighs himself daily, typically 127 lbs and has been stable.   - ACE stopped during admission due to concern for cough. Last visit started losartan, K increased but within normal limites. Stable Cr around 1.6.   2. CKD  - baseline Cr around 1.5-1.8, stable on last check  3. Aortic stenosis  - history of bioprosthetic AVR. He reports was done at [facility], approx 3 years but is not sure. We do not have these records at this time - echo 09/2013 showed heavily calcified prosthesis, mean grad of 10 which is stable from prior echoes. Gradient may be underestimated due to low output. From my review dimensionless index of 0.30 more consistent with moderate AS.   4. Afib - old records mention history of afib, not clearly described.  - he has not been on any anticoagulation, he is not sure if he ever has been - recent device check without any noted afib episodes.   5.  OSA Sleep study by [facility] Jan 2015 with mild OSA, recs to consider testing with nasal CPAP, unclear if this was done. - reports he has follow up with Dr Arsenio Katz for his hx of CVA, he is to discuss there  6. CVA - history unclear, he is on ASA and plavix for seconary prevetion. He is due to see neurology here in [facility]   Past Medical History  Diagnosis Date  . Chronic systolic heart failure     NYHA Class III  . Type 2 diabetes mellitus   . Nonischemic cardiomyopathy     EF 15-20%  . Essential hypertension, benign   . CKD (chronic kidney disease) stage 3, GFR 30-59 ml/min   . Anemia   . Hyperlipidemia   . History of stroke   . Bell's palsy   . Cardiomyopathy   . Aortic insufficiency   . Alcoholism in remission   . Noncompliance with medication regimen   . NSVT (nonsustained ventricular tachycardia)   . Cardiac defibrillator in situ   . COPD (chronic obstructive pulmonary disease)   . CVA (cerebral infarction) 01/2013  . Productive cough 08/2013    With brown sputum   . SOB (shortness of breath) on exertion 08/2013  . Numbness of right jaw     Had a stoke in 01/2013. Numbness is occasional, especially when trying to chew.  . At moderate risk for fall      No Known Allergies   Current Outpatient Prescriptions  Medication Sig Dispense Refill  . albuterol (PROVENTIL) (  2.5 MG/3ML) 0.083% nebulizer solution Take 3 mLs (2.5 mg total) by nebulization every 4 (four) hours as needed for wheezing or shortness of breath.  75 mL  12  . aspirin 325 MG EC tablet Take 325 mg by mouth daily.      Marland Kitchen atorvastatin (LIPITOR) 80 MG tablet Take 80 mg by mouth daily.      . carvedilol (COREG) 25 MG tablet Take 25 mg by mouth 2 (two) times daily with a meal.       . cloNIDine (CATAPRES - DOSED IN MG/24 HR) 0.2 mg/24hr patch Place 1 patch (0.2 mg total) onto the skin once a week.  4 patch  6  . clopidogrel (PLAVIX) 75 MG tablet Take 75 mg by mouth daily with breakfast.      . ferrous  sulfate 325 (65 FE) MG tablet Take 325 mg by mouth daily with breakfast.      . furosemide (LASIX) 80 MG tablet Take 80 mg by mouth 2 (two) times daily.       . insulin lispro (HUMALOG) 100 UNIT/ML injection Inject 2-6 Units into the skin 3 (three) times daily with meals.      Marland Kitchen levofloxacin (LEVAQUIN) 500 MG tablet Take 1 tablet (500 mg total) by mouth every other day.  4 tablet  0  . losartan (COZAAR) 25 MG tablet Take 1 tablet (25 mg total) by mouth daily.  30 tablet  6  . potassium chloride SA (K-DUR,KLOR-CON) 20 MEQ tablet Take 20 mEq by mouth daily.      . traMADol (ULTRAM) 50 MG tablet Take 50 mg by mouth every 6 (six) hours as needed.       No current facility-administered medications for this visit.     Past Surgical History  Procedure Laterality Date  . Cardiac defibrillator placement  11/12/12    [employer] Inogen MINI ICD implanted in [place] at [facility]  . Cardiac surgery      bioprostetic AVR per Dr Nelly Laurence note     No Known Allergies    Family History  Problem Relation Age of Onset  . Kidney disease Mother   . Kidney disease Sister      Social History Keith Young reports that he quit smoking about 20 years ago. His smoking use included Cigarettes. He smoked 0.00 packs per day. He has never used smokeless tobacco. Keith Young reports that he drinks alcohol.   Review of Systems CONSTITUTIONAL: No weight loss, fever, chills, weakness or fatigue.  HEENT: Eyes: No visual loss, blurred vision, double vision or yellow sclerae.No hearing loss, sneezing, congestion, runny nose or sore throat.  SKIN: No rash or itching.  CARDIOVASCULAR: per HPI RESPIRATORY: No shortness of breath, cough or sputum.  GASTROINTESTINAL: No anorexia, nausea, vomiting or diarrhea. No abdominal pain or blood.  GENITOURINARY: No burning on urination, no polyuria NEUROLOGICAL: No headache, dizziness, syncope, paralysis, ataxia, numbness or tingling in the extremities. No  change in bowel or bladder control.  MUSCULOSKELETAL: No muscle, back pain, joint pain or stiffness.  LYMPHATICS: No enlarged nodes. No history of splenectomy.  PSYCHIATRIC: No history of depression or anxiety.  ENDOCRINOLOGIC: No reports of sweating, cold or heat intolerance. No polyuria or polydipsia.  Marland Kitchen   Physical Examination p 69 bp 134/81 Wt 127 lbs BMI 23 Gen: resting comfortably, no acute distress HEENT: no scleral icterus, pupils equal round and reactive, no palptable cervical adenopathy,  CV: RRR, 2/6 systolic murmur RUSB, no JVD Resp: Clear to auscultation bilaterally  GI: abdomen is soft, non-tender, non-distended, normal bowel sounds, no hepatosplenomegaly MSK: extremities are warm, no edema.  Skin: warm, no rash Neuro:  no focal deficits Psych: appropriate affect   Diagnostic Studies 10/24/13 Echo  Study Conclusions  - Left ventricle: The cavity size was moderately dilated. Wall thickness was increased in a pattern of mild LVH. Systolic function was severely reduced. The estimated ejection fraction was in the range of 15% to 20%. Spontaneous echocontrast and low stroke volume consistent with low cardiac output. There is akinesis of the anteroseptal, anterior, mid to distal inferior, inferoseptal, and apical myocardium. Features are consistent with a pseudonormal left ventricular filling pattern, with concomitant abnormal relaxation and increased filling pressure (grade 2 diastolic dysfunction). Doppler parameters are consistent with elevated ventricular end-diastolic filling pressure. - Ventricular septum: Septal motion showed abnormal function and dyssynergy. - Aortic valve: Appears heavily calcified with poorly defined leaflet structure - cannot completely exclude stented bioprosthesis, but history at this point does not confirm this. There was possible moderate to severe stenosis, low gradient. There was no significant regurgitation. Mean gradient (S): 10  mm Hg. Peak gradient (S): 18 mm Hg. Valve area (VTI): 1.09 cm^2. Valve area (Vmax): 1 cm^2. LVOT/AV VTI ratio 0.31. - Mitral valve: Mildly thickened leaflets . There was mild regurgitation. - Left atrium: The atrium was moderately to severely dilated. - Right ventricle: The cavity size was moderately dilated. Pacer wire or catheter noted in right ventricle. Systolic function was moderately to severely reduced. - Right atrium: The atrium was mildly to moderately dilated. Central venous pressure (est): 15 mm Hg. - Tricuspid valve: There was moderate regurgitation. - Pulmonic valve: There was mild regurgitation. - Pulmonary arteries: PA peak pressure: 52 mm Hg (S). - Pericardium, extracardiac: There was no pericardial effusion.  Impressions:  - Moderate LV dilatation with mild LVH and LVEF approximately 15-20% and low cardiac output. Wall motion abnormalities and abnormal septal motion as outlined. Grade 2 diastolic dysfunction with increased filling pressures. Moderate to severe left atrial enlargement. Mild mitral regurgitation. Abnormal aortic valve as defined above - cannot exclude significant aortic stenosis based on available information. Device wire in right heart. Severe RV dysfunction. Moderate tricuspid regurgitation with PASP 52 mmHg and elevated CVP.  ICD information  He has appt scheduled on June 18, [employer] # [id number]. Lead [facility] #[id number] [facility] serial number [id number]. Placed November 13, 2012. I have asked that records be sent to our office in [place] and [place] for further information to help with history and records.   Cath 10/2012 [facility] "Patent coronaries"   Assessment and Plan  1. Chronic systolic heart failure/ NICM  - long history with most of his management in [place], [place] at [facility]  - most recent echo 5/2015LVEF 15-20%, severe RV dysfunction, he is NYHA III, with a [employer] ICD.  - will not further titrate ARB  due to his renal dysfunction. Avoiding aldactone.  - likely initiate hydral/nitrates at next visit - he has very severe biventricular failure and probably moderate if not severe AS of his prosthetic aortic valve. Given his complex heart failure will refer to CHF clinic for assistance with management. He may need consideration of advanced therapies in the near future given his severe disease.  2. CKD - renal function remains stable, continue to follow  3. Aortic stenosis - request records from AVR surgery to clarify the timing and type of valve placed. I would be surprised if this degree of calcifications if just 3 years  ago - morphologically stenosis appears severe, dimensionless index 0.30. Gradient understated in setting of severe LV dysfunction - once records available, consider further evaluation with possible TEE, vs possible dobutamine AV study. Will f/u thoughts of CHF clinic. Not sure he would be a candidate for redo procedure, or if considered for potential assist device in the near future if even warranted evaluating further.   4. Afib? - mentioned briefly in notes, unclear history. Has not been on anticoag. No afib noted on device check - continue to follow  5. Hx of CVA - f/u with neuro, defer dosing and use of ASA and plavix to them  6. OSA - patient is to f/u with Dr Arsenio Katz and discuss findings of prior testing.   7. HTN - weaning clonidine, decreased to 0.1 patch. Discontinue next visit.  - continue other meds, likely start hydral/nitrates at next visit.    F/u 1 month  Arnoldo Lenis, M.D., F.A.C.C.

## 2013-12-05 NOTE — Patient Instructions (Signed)
   Decrease Clonidine patch to 0.1mg  weekly - new sent to pharm Continue all other medications.   Referral to CHF clinic Follow up in  1 month

## 2013-12-11 ENCOUNTER — Telehealth: Payer: Self-pay | Admitting: *Deleted

## 2013-12-11 NOTE — Telephone Encounter (Signed)
Fax received from [facility] - reporting that patient was on Lisinopril & Losartan at the same time.  Call placed to siser Nickola Major) whom patient lives with.  Spoke with her husband.  Stated at last OV, he was still on the Lisinopril even though it was not on the list.  Stated that is was on d/c orders from hospital discharge, but was oversight on their part.  Stated they did see Dr. Merlene Laughter today & he give the order to d/c medication.  Advised to notify office if BP starts to trend upward.  Caregiver verbalized understanding.

## 2013-12-13 ENCOUNTER — Telehealth: Payer: Self-pay | Admitting: Cardiology

## 2013-12-13 NOTE — Telephone Encounter (Signed)
Called to speak with Dr. Nelly Laurence nurse about medication management. Please call [name] [phone] .Marland Kitchen

## 2013-12-16 NOTE — Telephone Encounter (Signed)
[name] called back to explain that patient had not been on Losartan & Lisinopril at the same time due to pharm not putting in his pack.  Patient has remained on the Losartan & off the Lisinopril.

## 2013-12-17 ENCOUNTER — Ambulatory Visit (HOSPITAL_COMMUNITY)
Admission: RE | Admit: 2013-12-17 | Discharge: 2013-12-17 | Disposition: A | Discharge status: Home / Self Care | Payer: Medicare HMO | Source: Ambulatory Visit | Attending: Cardiology | Admitting: Cardiology

## 2013-12-17 ENCOUNTER — Other Ambulatory Visit (HOSPITAL_COMMUNITY): Payer: Self-pay | Admitting: Vascular Surgery

## 2013-12-17 ENCOUNTER — Encounter (HOSPITAL_COMMUNITY): Payer: Self-pay

## 2013-12-17 VITALS — BP 114/62 | HR 68 | Wt 126.4 lb

## 2013-12-17 DIAGNOSIS — Z87891 Personal history of nicotine dependence: Secondary | ICD-10-CM | POA: Insufficient documentation

## 2013-12-17 DIAGNOSIS — I5022 Chronic systolic (congestive) heart failure: Secondary | ICD-10-CM | POA: Diagnosis not present

## 2013-12-17 DIAGNOSIS — E119 Type 2 diabetes mellitus without complications: Secondary | ICD-10-CM | POA: Insufficient documentation

## 2013-12-17 DIAGNOSIS — Z8673 Personal history of transient ischemic attack (TIA), and cerebral infarction without residual deficits: Secondary | ICD-10-CM | POA: Insufficient documentation

## 2013-12-17 DIAGNOSIS — I129 Hypertensive chronic kidney disease with stage 1 through stage 4 chronic kidney disease, or unspecified chronic kidney disease: Secondary | ICD-10-CM | POA: Insufficient documentation

## 2013-12-17 DIAGNOSIS — I509 Heart failure, unspecified: Secondary | ICD-10-CM | POA: Diagnosis not present

## 2013-12-17 DIAGNOSIS — E785 Hyperlipidemia, unspecified: Secondary | ICD-10-CM | POA: Diagnosis not present

## 2013-12-17 DIAGNOSIS — I10 Essential (primary) hypertension: Secondary | ICD-10-CM

## 2013-12-17 DIAGNOSIS — F1011 Alcohol abuse, in remission: Secondary | ICD-10-CM | POA: Insufficient documentation

## 2013-12-17 DIAGNOSIS — N189 Chronic kidney disease, unspecified: Secondary | ICD-10-CM | POA: Diagnosis not present

## 2013-12-17 DIAGNOSIS — R0989 Other specified symptoms and signs involving the circulatory and respiratory systems: Secondary | ICD-10-CM

## 2013-12-17 DIAGNOSIS — N182 Chronic kidney disease, stage 2 (mild): Secondary | ICD-10-CM

## 2013-12-17 MED ORDER — ASPIRIN 81 MG PO TBEC: 81.0000 mg | DELAYED_RELEASE_TABLET | Freq: Every day | ORAL | Status: DC

## 2013-12-17 MED ORDER — ISOSORB DINITRATE-HYDRALAZINE 20-37.5 MG PO TABS: 1.0000 | ORAL_TABLET | Freq: Three times a day (TID) | ORAL | Status: DC

## 2013-12-17 NOTE — Patient Instructions (Signed)
Stop Clonidine  Start Bidil 1 tab Three times a day, if it is to expensive please call and let me know  Decrease Aspirin to 81 mg daily  PLEASE BRING ALL MEDICATION BOTTLES TO NEXT APPOINTMENT  Your physician has requested that you have a carotid duplex. This test is an ultrasound of the carotid arteries in your neck. It looks at blood flow through these arteries that supply the brain with blood. Allow one hour for this exam. There are no restrictions or special instructions.  Your physician has recommended that you have a cardiopulmonary stress test (CPX). CPX testing is a non-invasive measurement of heart and lung function. It replaces a traditional treadmill stress test. This type of test provides a tremendous amount of information that relates not only to your present condition but also for future outcomes. This test combines measurements of you ventilation, respiratory gas exchange in the lungs, electrocardiogram (EKG), blood pressure and physical response before, during, and following an exercise protocol.  Your physician recommends that you schedule a follow-up appointment in: 1 month with labs (bmet, lipid, bnp, cbc)

## 2013-12-18 DIAGNOSIS — R0989 Other specified symptoms and signs involving the circulatory and respiratory systems: Secondary | ICD-10-CM | POA: Insufficient documentation

## 2013-12-18 NOTE — Progress Notes (Signed)
Patient ID: Keith Young, male   DOB: [birth year], 63 y.o.   MRN: [id number] PCP: Dr. Luan Pulling Primary Cardiology: Dr. Harl Bowie  63 yo with history of nonischemic cardiomyopathy and aortic valve replacement presents for CHF clinic evaluation.  Patient is followed in [facility] by Dr. Harl Bowie.  He recently moved to [place] from [place] to live in an adult care home run by his sister and brother-in-law.  Most recent echo in 5/15 showed EF 15-20% with RV dysfunction.  He had left heart cath in [place] last year with no significant CAD.  Cause of cardiomyopathy thought to be h/o ETOH abuse + HTN.  He has a [place].  He also has a bioprosthetic aortic valve (placed in [place], not sure when) with possible low gradient moderate bioprosthetic valve stenosis by last echo.    Symptomatically, patient has been stable.  Weighs daily, minimal change.  He is relatively sedentary.  No dyspnea walking on flat ground.  Was able to walk into office today without difficulty.  Sleeps in hospital bed with head elevated.  No PND.  No chest pain. Used to drink "a lot of beer" but none now.  Weight is down 1 lb since appt with Dr Harl Bowie.    ECG: NSR, lateral TWIs, QRS 116 msec, LAFB  Labs (6/15): K 4.7, creatinine 1.6  PMH: 1. Nonischemic cardiomyopathy: Echo (5/15) with moderate LV dilation, EF 15-20%, grade II diastolic dysfunction, mild MR, moderate-severe LAE, bioprosthetic aortic valve with mean gradient 10 mmHg, moderate RV dilation with moderate to severely decreased systolic function, moderate TR, PA systolic pressure 52 mmHg (similar to prior echoes from [facility] in [place]).  LHC (6/14 in [place]) showed no significant coronary disease.  History of prior ETOH abuse as well as HTN that may have contributed.  [place].  2. ACEI cough.  3. ETOH abuse history 4. Bioprosthetic aortic valve with concern for possible moderate bioprosthetic valve stenosis on 5/15 echo  (mean gradient 10 mmHg, dimensionless index 0.3). 5. ? Prior atrial fibrillation but patient has not been anticoagulated and device interrogation has not shown atrial fibrillation.  6. H/o CVA: Followed by neurology in [place].  7. HTN 8. Type II diabetes 9. Hyperlipidemia 10. Bells palsy 11. OSA: Awaiting CPAP. 12. CKD  SH: Prior heavy ETOH, now none.  Quit smoking about 20 years ago.  Lives in [place] at adult care facility run by sister and brother-in-law.    FH: CKD, no cardiomyopathy that he is aware of.   ROS: All systems reviewed and negative except as per HPI.   Current Outpatient Prescriptions  Medication Sig Dispense Refill  . albuterol (PROVENTIL) (2.5 MG/3ML) 0.083% nebulizer solution Take 3 mLs (2.5 mg total) by nebulization every 4 (four) hours as needed for wheezing or shortness of breath.  75 mL  12  . aspirin 81 MG EC tablet Take 1 tablet (81 mg total) by mouth daily.      Marland Kitchen atorvastatin (LIPITOR) 80 MG tablet Take 80 mg by mouth daily.      . carvedilol (COREG) 25 MG tablet Take 25 mg by mouth 2 (two) times daily with a meal.       . clopidogrel (PLAVIX) 75 MG tablet Take 75 mg by mouth daily with breakfast.      . ferrous sulfate 325 (65 FE) MG tablet Take 325 mg by mouth daily with breakfast.      . furosemide (LASIX) 80 MG tablet Take 80 mg by mouth 2 (two)  times daily.       . insulin lispro (HUMALOG) 100 UNIT/ML injection Inject 2-6 Units into the skin 3 (three) times daily with meals.      Marland Kitchen levofloxacin (LEVAQUIN) 500 MG tablet Take 1 tablet (500 mg total) by mouth every other day.  4 tablet  0  . losartan (COZAAR) 25 MG tablet Take 1 tablet (25 mg total) by mouth daily.  30 tablet  6  . potassium chloride SA (K-DUR,KLOR-CON) 20 MEQ tablet Take 20 mEq by mouth daily.      . traMADol (ULTRAM) 50 MG tablet Take 50 mg by mouth every 6 (six) hours as needed.      . isosorbide-hydrALAZINE (BIDIL) 20-37.5 MG per tablet Take 1 tablet by mouth 3 (three) times daily.  90  tablet  3   No current facility-administered medications for this encounter.    BP 114/62  Pulse 68  Wt 126 lb 6.4 oz (57.335 kg)  SpO2 100% General: NAD Neck: JVP 7-8 cm, no thyromegaly or thyroid nodule.  Lungs: Clear to auscultation bilaterally with normal respiratory effort. CV: Nondisplaced PMI.  Heart regular S1/S2, no [id number], 2/6 systolic murmur RUSB with clear S2.  No peripheral edema.  Left carotid bruit.  Normal pedal pulses.  Abdomen: Soft, nontender, no hepatosplenomegaly, no distention.  Skin: Intact without lesions or rashes.  Neurologic: Alert and oriented x 3.  Psych: Normal affect. Extremities: No clubbing or cyanosis.  HEENT: Normal.   Assessment/Plan:  1. Chronic systolic CHF: Nonischemic cardiomyopathy with biventricular failure, probably due to ETOH abuse (prior) + HTN.  EF 15-20% on last echo with moderate to severe RV dysfunction.  On exam today, he is not significantly volume overloaded.  NYHA class II-III symptoms.  [place].  - Continue current Coreg and losartan.  Will not titrate losartan today with CKD.  - Add Bidil 1 tab tid and stop clonidine (discussed with brother-in-law).   - Next step would be low dose spironolactone.  - I will arrange for CPX for objective functional assessment.  Probably not a great candidate for advanced therapies, does not seem to have great insight into illness, most of conversation was with his brother-in-law who runs his adult care facility.  Will need to follow closely.  - BMET/BNP in 1 month.  2. H/o CVA: He is on Plavix, ASA, and statin.  Can decrease ASA to 81 mg daily.  3. Hyperlipidemia: Goal LDL < 70 with CVA history.  Check lipids at 1 month followup.  4. Carotid bruit: On left, has history of CVA.  Will get carotid dopplers.  5. CKD: Follow creatinine closely with medication titration.  6. Bioprosthetic aortic valve: There is a question of possible moderate bioprosthetic valve stenosis.  Will need to follow  closely, repeat echo in 5/16.   Loralie Champagne 12/18/2013

## 2013-12-18 NOTE — Addendum Note (Signed)
Encounter addended by: Bonne Dolores, NT on: 12/18/2013  8:53 AM<BR>     Documentation filed: Charges VN

## 2013-12-20 NOTE — Addendum Note (Signed)
Encounter addended by: Scarlette Calico, RN on: 12/20/2013 12:08 PM<BR>     Documentation filed: Visit Diagnoses, Orders

## 2013-12-25 ENCOUNTER — Ambulatory Visit (HOSPITAL_COMMUNITY)
Admission: RE | Admit: 2013-12-25 | Discharge: 2013-12-25 | Disposition: A | Discharge status: Home / Self Care | Payer: Medicare HMO | Source: Ambulatory Visit | Attending: Cardiology | Admitting: Cardiology

## 2013-12-25 DIAGNOSIS — R0989 Other specified symptoms and signs involving the circulatory and respiratory systems: Secondary | ICD-10-CM | POA: Diagnosis present

## 2013-12-25 DIAGNOSIS — Z8673 Personal history of transient ischemic attack (TIA), and cerebral infarction without residual deficits: Secondary | ICD-10-CM | POA: Diagnosis not present

## 2013-12-25 DIAGNOSIS — E119 Type 2 diabetes mellitus without complications: Secondary | ICD-10-CM | POA: Insufficient documentation

## 2013-12-27 ENCOUNTER — Telehealth (HOSPITAL_COMMUNITY): Payer: Self-pay | Admitting: Cardiology

## 2013-12-27 NOTE — Telephone Encounter (Signed)
Message copied by Andreanna Mikolajczak, Sharlot Gowda on Fri Dec 27, 2013  4:32 PM ------      Message from: Larey Dresser      Created: Fri Dec 27, 2013 12:06 PM       < 50% stenosis bilaterally ------

## 2013-12-27 NOTE — Telephone Encounter (Signed)
Pt aware of results 

## 2013-12-31 ENCOUNTER — Telehealth (HOSPITAL_COMMUNITY): Payer: Self-pay | Admitting: *Deleted

## 2013-12-31 ENCOUNTER — Ambulatory Visit (HOSPITAL_COMMUNITY): Payer: Medicare HMO | Attending: Cardiology

## 2013-12-31 DIAGNOSIS — I509 Heart failure, unspecified: Secondary | ICD-10-CM

## 2013-12-31 DIAGNOSIS — I428 Other cardiomyopathies: Secondary | ICD-10-CM | POA: Insufficient documentation

## 2013-12-31 NOTE — Telephone Encounter (Signed)
Message copied by Scarlette Calico on Tue Dec 31, 2013  4:25 PM ------      Message from: Larey Dresser      Created: Fri Dec 27, 2013 12:06 PM       < 50% stenosis bilaterally ------

## 2013-12-31 NOTE — Telephone Encounter (Signed)
Pt aware.

## 2014-01-03 DIAGNOSIS — I509 Heart failure, unspecified: Secondary | ICD-10-CM

## 2014-01-21 ENCOUNTER — Encounter (HOSPITAL_COMMUNITY): Payer: Self-pay | Admitting: *Deleted

## 2014-01-21 ENCOUNTER — Ambulatory Visit (HOSPITAL_COMMUNITY)
Admission: RE | Admit: 2014-01-21 | Discharge: 2014-01-21 | Disposition: A | Discharge status: Home / Self Care | Payer: Medicare HMO | Source: Ambulatory Visit | Attending: Cardiology | Admitting: Cardiology

## 2014-01-21 ENCOUNTER — Encounter (HOSPITAL_COMMUNITY): Payer: Self-pay

## 2014-01-21 ENCOUNTER — Other Ambulatory Visit: Payer: Self-pay | Admitting: *Deleted

## 2014-01-21 VITALS — BP 100/60 | HR 69 | Wt 131.1 lb

## 2014-01-21 DIAGNOSIS — Z7982 Long term (current) use of aspirin: Secondary | ICD-10-CM | POA: Diagnosis not present

## 2014-01-21 DIAGNOSIS — N183 Chronic kidney disease, stage 3 unspecified: Secondary | ICD-10-CM

## 2014-01-21 DIAGNOSIS — I509 Heart failure, unspecified: Secondary | ICD-10-CM

## 2014-01-21 DIAGNOSIS — Z954 Presence of other heart-valve replacement: Secondary | ICD-10-CM | POA: Insufficient documentation

## 2014-01-21 DIAGNOSIS — Z87891 Personal history of nicotine dependence: Secondary | ICD-10-CM | POA: Diagnosis not present

## 2014-01-21 DIAGNOSIS — Z7901 Long term (current) use of anticoagulants: Secondary | ICD-10-CM | POA: Diagnosis not present

## 2014-01-21 DIAGNOSIS — Z794 Long term (current) use of insulin: Secondary | ICD-10-CM | POA: Insufficient documentation

## 2014-01-21 DIAGNOSIS — Z8673 Personal history of transient ischemic attack (TIA), and cerebral infarction without residual deficits: Secondary | ICD-10-CM

## 2014-01-21 DIAGNOSIS — G4733 Obstructive sleep apnea (adult) (pediatric): Secondary | ICD-10-CM | POA: Insufficient documentation

## 2014-01-21 DIAGNOSIS — I6529 Occlusion and stenosis of unspecified carotid artery: Secondary | ICD-10-CM | POA: Insufficient documentation

## 2014-01-21 DIAGNOSIS — I428 Other cardiomyopathies: Secondary | ICD-10-CM | POA: Diagnosis not present

## 2014-01-21 DIAGNOSIS — F1021 Alcohol dependence, in remission: Secondary | ICD-10-CM | POA: Insufficient documentation

## 2014-01-21 DIAGNOSIS — G510 Bell's palsy: Secondary | ICD-10-CM | POA: Diagnosis not present

## 2014-01-21 DIAGNOSIS — E119 Type 2 diabetes mellitus without complications: Secondary | ICD-10-CM | POA: Diagnosis not present

## 2014-01-21 DIAGNOSIS — E785 Hyperlipidemia, unspecified: Secondary | ICD-10-CM | POA: Diagnosis not present

## 2014-01-21 DIAGNOSIS — I5022 Chronic systolic (congestive) heart failure: Secondary | ICD-10-CM | POA: Insufficient documentation

## 2014-01-21 DIAGNOSIS — R05 Cough: Secondary | ICD-10-CM | POA: Diagnosis not present

## 2014-01-21 DIAGNOSIS — R0989 Other specified symptoms and signs involving the circulatory and respiratory systems: Secondary | ICD-10-CM | POA: Insufficient documentation

## 2014-01-21 DIAGNOSIS — I129 Hypertensive chronic kidney disease with stage 1 through stage 4 chronic kidney disease, or unspecified chronic kidney disease: Secondary | ICD-10-CM | POA: Diagnosis not present

## 2014-01-21 DIAGNOSIS — R059 Cough, unspecified: Secondary | ICD-10-CM | POA: Insufficient documentation

## 2014-01-21 DIAGNOSIS — I4891 Unspecified atrial fibrillation: Secondary | ICD-10-CM | POA: Diagnosis not present

## 2014-01-21 DIAGNOSIS — I2510 Atherosclerotic heart disease of native coronary artery without angina pectoris: Secondary | ICD-10-CM

## 2014-01-21 DIAGNOSIS — N189 Chronic kidney disease, unspecified: Secondary | ICD-10-CM | POA: Insufficient documentation

## 2014-01-21 MED ORDER — SPIRONOLACTONE 25 MG PO TABS: 12.5000 mg | ORAL_TABLET | Freq: Every day | ORAL | Status: DC

## 2014-01-21 MED ORDER — DIGOXIN 125 MCG PO TABS: 0.1250 mg | ORAL_TABLET | Freq: Every day | ORAL | Status: DC

## 2014-01-21 NOTE — Progress Notes (Signed)
Patient ID: Keith Young, male   DOB: 06/30/1950, 63 y.o.   MRN: [id number] PCP: Dr. Luan Pulling Primary Cardiology: Dr. Harl Bowie  63 yo with history of nonischemic cardiomyopathy and aortic valve replacement presents for CHF clinic evaluation.  Patient is followed in [facility] by Dr. Harl Bowie.  He recently moved to [place] from [place] to live in an adult care home run by his sister and brother-in-law.  Most recent echo in 5/15 showed EF 15-20% with RV dysfunction.  He had left heart cath in [place] last year with no significant CAD.  Cause of cardiomyopathy thought to be h/o ETOH abuse + HTN.  He has a [place].  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  He had a CPX done in 8/15 that showed severe functional limitation.    Patient is more interactive today, but most history still comes from his brother-in-law.  He is very inactive.  His gait is unstable but no falls. He is not lightheaded.  He can walk around the adult care home where he lives without significant dyspnea.  He has trouble walking more than about 100 feet, however.  He is short of breath if he walks fast.  No chest pain.  No orthopnea or PND.    Given the severity of functional defect on his CPX, I had Zada Girt talk to him about LVAD.  He had very poor comprehension of what she talked to him about.  His brother-in-law continued to talk for him.   Labs (6/15): K 4.7, creatinine 1.6  PMH: 1. Nonischemic cardiomyopathy: Echo (5/15) with moderate LV dilation, EF 15-20%, grade II diastolic dysfunction, mild MR, moderate-severe LAE, bioprosthetic aortic valve with mean gradient 10 mmHg, moderate RV dilation with moderate to severely decreased systolic function, moderate TR, PA systolic pressure 52 mmHg (similar to prior echoes from [facility] in [place]).  LHC (6/14 in [place]) showed no  significant coronary disease.  History of prior ETOH abuse as well as HTN that may have contributed.  [place].  CPX (8/15): peak VO2 8.9, VE/VCO2 slope 38.1, RER 1.14 (severe functional limitation).  2. ACEI cough.  3. ETOH abuse history 4. 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 5. ? Prior atrial fibrillation but patient has not been anticoagulated and device interrogation has not shown atrial fibrillation.  6. H/o CVA: Followed by neurology in [place].  7. HTN 8. Type II diabetes 9. Hyperlipidemia 10. Bells palsy 11. OSA: Awaiting CPAP. 12. CKD 13. Carotid stenosis: Carotid dopplers (7/15) with < 50% bilateral ICA stenosis.   SH: Prior heavy ETOH, now none.  Quit smoking about 20 years ago.  Lives in [place] at adult care facility run by sister and brother-in-law.    FH: CKD, no cardiomyopathy that he is aware of.   ROS: All systems reviewed and negative except as per HPI.   Current Outpatient Prescriptions  Medication Sig Dispense Refill  . albuterol (PROVENTIL) (2.5 MG/3ML) 0.083% nebulizer solution Take 3 mLs (2.5 mg total) by nebulization every 4 (four) hours as needed for wheezing or shortness of breath.  75 mL  12  . aspirin 81 MG EC tablet Take 1 tablet (81 mg total) by mouth daily.      Marland Kitchen atorvastatin (LIPITOR) 80 MG tablet Take 80 mg by mouth daily.      Marland Kitchen  carvedilol (COREG) 25 MG tablet Take 25 mg by mouth 2 (two) times daily with a meal.       . clopidogrel (PLAVIX) 75 MG tablet Take 75 mg by mouth daily with breakfast.      . ferrous sulfate 325 (65 FE) MG tablet Take 325 mg by mouth daily with breakfast.      . furosemide (LASIX) 80 MG tablet Take 80 mg by mouth 2 (two) times daily.       . insulin lispro (HUMALOG) 100 UNIT/ML injection Inject 2-6 Units into the skin 3 (three) times daily with meals.      . isosorbide-hydrALAZINE (BIDIL) 20-37.5  MG per tablet Take 1 tablet by mouth 3 (three) times daily.  90 tablet  3  . levofloxacin (LEVAQUIN) 500 MG tablet Take 1 tablet (500 mg total) by mouth every other day.  4 tablet  0  . losartan (COZAAR) 25 MG tablet Take 1 tablet (25 mg total) by mouth daily.  30 tablet  6  . traMADol (ULTRAM) 50 MG tablet Take 50 mg by mouth every 6 (six) hours as needed.      . digoxin (LANOXIN) 0.125 MG tablet Take 1 tablet (0.125 mg total) by mouth daily.  30 tablet  6  . spironolactone (ALDACTONE) 25 MG tablet Take 0.5 tablets (12.5 mg total) by mouth daily.  15 tablet  3   No current facility-administered medications for this encounter.    BP 100/60  Pulse 69  Wt 131 lb 1.9 oz (59.476 kg)  SpO2 99% General: NAD Neck: JVP 8 cm, no thyromegaly or thyroid nodule.  Lungs: Clear to auscultation bilaterally with normal respiratory effort. CV: Nondisplaced PMI.  Heart regular S1/S2, no [id number], 2/6 systolic murmur RUSB with clear S2.  No peripheral edema.  Left carotid bruit.  Normal pedal pulses.  Abdomen: Soft, nontender, no hepatosplenomegaly, no distention.  Skin: Intact without lesions or rashes.  Neurologic: Alert and oriented x 3.  Psych: Normal affect. Extremities: No clubbing or cyanosis.   Assessment/Plan:  1. Chronic systolic CHF: Nonischemic cardiomyopathy with biventricular failure, probably due to ETOH abuse (prior) + HTN.  EF 15-20% on last echo with moderate to severe RV dysfunction.  On exam today, he is not significantly volume overloaded.  NYHA class III symptoms.  [place]. CPX showed severe functional limitation.  He tolerated the Bidil that was added at last appointment.  Patient had in-depth discussion with Zada Girt regarding LVAD today.  It does not appear that he has the comprehension to be able to manage an LVAD. He has a history of CVA. I think that we will need to medically manage him. - Continue current Coreg and losartan.  Will not titrate losartan today with CKD.   - Continue Bidil - Add spironolactone 12.5 mg daily and digoxin 0.125 mg daily.  He will need to stop KCl and will get BMET/digoxin level in 1 week.  - I will arrange for RHC in a couple of weeks after med changes to assess cardiac output and filling pressures on this regimen.  2. H/o CVA: He is on Plavix, ASA, and statin.    3. Hyperlipidemia: Goal LDL < 70 with CVA history.  Check lipids at followup. 4. Carotid bruit: On left, has history of CVA.  Mild stenosis on carotid dopplers.   5. CKD: Follow creatinine closely with medication titration.  6. Bioprosthetic aortic valve: There is a question of possible moderate bioprosthetic valve stenosis.  Will need to  follow closely, repeat echo in 5/16.   Followup in 3 wks.   Loralie Champagne 01/21/2014

## 2014-01-21 NOTE — Patient Instructions (Signed)
Stop Potassium  Start Spironolactone 12.5 mg (1/2 tab) daily  Start Digoxin 0.125 mg daily  Labs in 1 week  Heart Catheterization on Thur 9/10, see instruction sheet  Your physician recommends that you schedule a follow-up appointment in: 3 weeks with Dr Aundra Dubin

## 2014-01-22 NOTE — Progress Notes (Signed)
VAD coordinator met with patient and brother-in-law per Dr. Claris Gladden request to educate and answer questions re: HM II LVAD.   VAD educational packet including "HM II Left Ventricular Assist System" packet and HM II Patient Education DVD given to patient and family for their review. Equipment demonstration with HM II training loop included the following:  Equipment reviewed:  a) power module  b) system controller  c) universal [employer] fundraiser  d) battery clips  e) Batteries  f) Perc lock  g) Percutaneous lead   Demonstrated:  a) changing power source on system controller from tethered (power module) to untethered (battery) mode  b) changing power source on system controller from untethered (battery) to tethered (power module) mode  c) how to monitor battery life both on the system controller and on each individual battery  d) changing batteries   Stressed importance of never disconnecting power from both controller power leads at the same time, and never disconnecting both batteries at the same time, or the pump will stop.   Stressed importance of performing home inspection and assuring that only three pronged grounded power outlets can be used for VAD equipment. Patient lives in adult group home with brother-in-law overseeing care of patients.   Identified the following changes in activities of daily living with pump:  1. No driving for at least six months and then only if doctor gives permission to do so; pt does not drive.  2. No tub baths while pump implanted, and shower only if doctor gives permission.  3. No swimming or submersion in water while implanted with pump.  4. No contact sports or engaging in jumping activities.  5. Always have a backup controller, charged spare batteries, and battery clips nearby at all times in case of emergency.   6 Plan to sleep only when connected to the power module.  7 Keep a backup system controller, charged batteries, battery clips, and  flashlight near you during sleep in case of electrical power outage.  8. Exit site care including dressing changes, monitoring for infection, and importance of keeping percutaneous lead stabilized at all times.   Also discussed need for 24 hour/7 day week caregivers; brother-in-law designated himself and wife as they oversee patients at the adult group home.  Explained pt will need weekly clinic visits at discharge, frequent labs; also he will be on sternal precautions with no lifting, pushing, pulling and he will need assistance with adapting to new life style with VAD equipment and care. Explained patient will be on aspirin and coumadin and importance of INR testing to maintain therapeutic INR to prevent pump clotting and or strokes/bleeding.    At end of discussion, patient was unable to verbalize any understanding of purpose of VAD, how it works, lifestyle changes, importance of meds, or why close f/u is necessary.   Time spent: 60 minutes

## 2014-01-23 ENCOUNTER — Encounter: Payer: Medicare HMO | Admitting: Cardiology

## 2014-01-23 NOTE — Progress Notes (Signed)
Clinical Summary Keith Young is a 63 y.o.male   Past Medical History  Diagnosis Date  . Chronic systolic heart failure     NYHA Class III  . Type 2 diabetes mellitus   . Nonischemic cardiomyopathy     EF 15-20%  . Essential hypertension, benign   . CKD (chronic kidney disease) stage 3, GFR 30-59 ml/min   . Anemia   . Hyperlipidemia   . History of stroke   . Bell's palsy   . Cardiomyopathy   . Aortic insufficiency   . Alcoholism in remission   . Noncompliance with medication regimen   . NSVT (nonsustained ventricular tachycardia)   . Cardiac defibrillator in situ   . COPD (chronic obstructive pulmonary disease)   . CVA (cerebral infarction) 01/2013  . Productive cough 08/2013    With brown sputum   . SOB (shortness of breath) on exertion 08/2013  . Numbness of right jaw     Had a stoke in 01/2013. Numbness is occasional, especially when trying to chew.  . At moderate risk for fall      No Known Allergies   Current Outpatient Prescriptions  Medication Sig Dispense Refill  . albuterol (PROVENTIL) (2.5 MG/3ML) 0.083% nebulizer solution Take 3 mLs (2.5 mg total) by nebulization every 4 (four) hours as needed for wheezing or shortness of breath.  75 mL  12  . aspirin 81 MG EC tablet Take 1 tablet (81 mg total) by mouth daily.      Marland Kitchen atorvastatin (LIPITOR) 80 MG tablet Take 80 mg by mouth daily.      . carvedilol (COREG) 25 MG tablet Take 25 mg by mouth 2 (two) times daily with a meal.       . clopidogrel (PLAVIX) 75 MG tablet Take 75 mg by mouth daily with breakfast.      . digoxin (LANOXIN) 0.125 MG tablet Take 1 tablet (0.125 mg total) by mouth daily.  30 tablet  6  . ferrous sulfate 325 (65 FE) MG tablet Take 325 mg by mouth daily with breakfast.      . furosemide (LASIX) 80 MG tablet Take 80 mg by mouth 2 (two) times daily.       . insulin lispro (HUMALOG) 100 UNIT/ML injection Inject 2-6 Units into the skin 3 (three) times daily with meals.      .  isosorbide-hydrALAZINE (BIDIL) 20-37.5 MG per tablet Take 1 tablet by mouth 3 (three) times daily.  90 tablet  3  . levofloxacin (LEVAQUIN) 500 MG tablet Take 1 tablet (500 mg total) by mouth every other day.  4 tablet  0  . losartan (COZAAR) 25 MG tablet Take 1 tablet (25 mg total) by mouth daily.  30 tablet  6  . spironolactone (ALDACTONE) 25 MG tablet Take 0.5 tablets (12.5 mg total) by mouth daily.  15 tablet  3  . traMADol (ULTRAM) 50 MG tablet Take 50 mg by mouth every 6 (six) hours as needed.       No current facility-administered medications for this visit.     Past Surgical History  Procedure Laterality Date  . Cardiac defibrillator placement  11/12/12    [employer] Inogen MINI ICD implanted in [place] at [facility]  . Cardiac surgery      bioprostetic AVR per Dr Nelly Laurence note     No Known Allergies    Family History  Problem Relation Age of Onset  . Kidney disease Mother   . Kidney disease  Sister      Social History Keith Young reports that he quit smoking about 20 years ago. His smoking use included Cigarettes. He smoked 0.00 packs per day. He has never used smokeless tobacco. Keith Young reports that he drinks alcohol.   Review of Systems CONSTITUTIONAL: No weight loss, fever, chills, weakness or fatigue.  HEENT: Eyes: No visual loss, blurred vision, double vision or yellow sclerae.No hearing loss, sneezing, congestion, runny nose or sore throat.  SKIN: No rash or itching.  CARDIOVASCULAR:  RESPIRATORY: No shortness of breath, cough or sputum.  GASTROINTESTINAL: No anorexia, nausea, vomiting or diarrhea. No abdominal pain or blood.  GENITOURINARY: No burning on urination, no polyuria NEUROLOGICAL: No headache, dizziness, syncope, paralysis, ataxia, numbness or tingling in the extremities. No change in bowel or bladder control.  MUSCULOSKELETAL: No muscle, back pain, joint pain or stiffness.  LYMPHATICS: No enlarged nodes. No history of  splenectomy.  PSYCHIATRIC: No history of depression or anxiety.  ENDOCRINOLOGIC: No reports of sweating, cold or heat intolerance. No polyuria or polydipsia.  Marland Kitchen   Physical Examination There were no vitals filed for this visit. There were no vitals filed for this visit.  Gen: resting comfortably, no acute distress HEENT: no scleral icterus, pupils equal round and reactive, no palptable cervical adenopathy,  CV Resp: Clear to auscultation bilaterally GI: abdomen is soft, non-tender, non-distended, normal bowel sounds, no hepatosplenomegaly MSK: extremities are warm, no edema.  Skin: warm, no rash Neuro:  no focal deficits Psych: appropriate affect   Diagnostic Studies     Assessment and Plan        Arnoldo Lenis, M.D., F.A.C.C.

## 2014-02-05 ENCOUNTER — Telehealth (HOSPITAL_COMMUNITY): Payer: Self-pay | Admitting: Cardiology

## 2014-02-05 NOTE — Telephone Encounter (Signed)
Pt scheduled for RHC on 02/06/2014 Cpt code 93453 icd9 428.23 With pts current insurance- Laneta Simmers case # [id number]

## 2014-02-06 ENCOUNTER — Encounter (HOSPITAL_COMMUNITY)
Admission: RE | Disposition: A | Discharge status: Home / Self Care | Payer: Self-pay | Source: Ambulatory Visit | Attending: Cardiology

## 2014-02-06 ENCOUNTER — Ambulatory Visit (HOSPITAL_COMMUNITY)
Admission: RE | Admit: 2014-02-06 | Discharge: 2014-02-06 | Disposition: A | Discharge status: Home / Self Care | Payer: Medicare HMO | Source: Ambulatory Visit | Attending: Cardiology | Admitting: Cardiology

## 2014-02-06 ENCOUNTER — Encounter (HOSPITAL_COMMUNITY): Payer: Self-pay | Admitting: Pharmacy Technician

## 2014-02-06 DIAGNOSIS — Z87891 Personal history of nicotine dependence: Secondary | ICD-10-CM | POA: Insufficient documentation

## 2014-02-06 DIAGNOSIS — E119 Type 2 diabetes mellitus without complications: Secondary | ICD-10-CM | POA: Diagnosis not present

## 2014-02-06 DIAGNOSIS — I509 Heart failure, unspecified: Secondary | ICD-10-CM

## 2014-02-06 DIAGNOSIS — Z7902 Long term (current) use of antithrombotics/antiplatelets: Secondary | ICD-10-CM | POA: Diagnosis not present

## 2014-02-06 DIAGNOSIS — E785 Hyperlipidemia, unspecified: Secondary | ICD-10-CM | POA: Diagnosis not present

## 2014-02-06 DIAGNOSIS — I428 Other cardiomyopathies: Secondary | ICD-10-CM | POA: Diagnosis not present

## 2014-02-06 DIAGNOSIS — Z794 Long term (current) use of insulin: Secondary | ICD-10-CM | POA: Diagnosis not present

## 2014-02-06 DIAGNOSIS — I129 Hypertensive chronic kidney disease with stage 1 through stage 4 chronic kidney disease, or unspecified chronic kidney disease: Secondary | ICD-10-CM | POA: Insufficient documentation

## 2014-02-06 DIAGNOSIS — Z95810 Presence of automatic (implantable) cardiac defibrillator: Secondary | ICD-10-CM | POA: Diagnosis not present

## 2014-02-06 DIAGNOSIS — Z954 Presence of other heart-valve replacement: Secondary | ICD-10-CM | POA: Insufficient documentation

## 2014-02-06 DIAGNOSIS — Z79899 Other long term (current) drug therapy: Secondary | ICD-10-CM | POA: Diagnosis not present

## 2014-02-06 DIAGNOSIS — I658 Occlusion and stenosis of other precerebral arteries: Secondary | ICD-10-CM | POA: Diagnosis not present

## 2014-02-06 DIAGNOSIS — I5022 Chronic systolic (congestive) heart failure: Secondary | ICD-10-CM | POA: Insufficient documentation

## 2014-02-06 DIAGNOSIS — N189 Chronic kidney disease, unspecified: Secondary | ICD-10-CM | POA: Insufficient documentation

## 2014-02-06 DIAGNOSIS — I6529 Occlusion and stenosis of unspecified carotid artery: Secondary | ICD-10-CM | POA: Diagnosis not present

## 2014-02-06 DIAGNOSIS — F1011 Alcohol abuse, in remission: Secondary | ICD-10-CM | POA: Diagnosis not present

## 2014-02-06 DIAGNOSIS — Z8673 Personal history of transient ischemic attack (TIA), and cerebral infarction without residual deficits: Secondary | ICD-10-CM | POA: Insufficient documentation

## 2014-02-06 DIAGNOSIS — Z7982 Long term (current) use of aspirin: Secondary | ICD-10-CM | POA: Diagnosis not present

## 2014-02-06 HISTORY — PX: RIGHT HEART CATHETERIZATION: SHX5447

## 2014-02-06 LAB — BASIC METABOLIC PANEL
Anion gap: 10 (ref 5–15)
BUN: 45 mg/dL — ABNORMAL HIGH (ref 6–23)
CALCIUM: 8.9 mg/dL (ref 8.4–10.5)
CO2: 23 mEq/L (ref 19–32)
Chloride: 105 mEq/L (ref 96–112)
Creatinine, Ser: 1.46 mg/dL — ABNORMAL HIGH (ref 0.50–1.35)
GFR calc Af Amer: 58 mL/min — ABNORMAL LOW (ref >90)
GFR, EST NON AFRICAN AMERICAN: 50 mL/min — AB (ref >90)
Glucose, Bld: 89 mg/dL (ref 70–99)
POTASSIUM: 4.3 meq/L (ref 3.7–5.3)
Sodium: 138 mEq/L (ref 137–147)

## 2014-02-06 LAB — CBC
HCT: 29.6 % — ABNORMAL LOW (ref 39.0–52.0)
Hemoglobin: 9.3 g/dL — ABNORMAL LOW (ref 13.0–17.0)
MCH: 27.4 pg (ref 26.0–34.0)
MCHC: 31.4 g/dL (ref 30.0–36.0)
MCV: 87.1 fL (ref 78.0–100.0)
PLATELETS: 158 10*3/uL (ref 150–400)
RBC: 3.4 MIL/uL — ABNORMAL LOW (ref 4.22–5.81)
RDW: 15.3 % (ref 11.5–15.5)
WBC: 3.1 10*3/uL — AB (ref 4.0–10.5)

## 2014-02-06 LAB — POCT I-STAT 3, VENOUS BLOOD GAS (G3P V)
Acid-base deficit: 1 mmol/L (ref 0.0–2.0)
BICARBONATE: 23.7 meq/L (ref 20.0–24.0)
O2 Saturation: 66 %
PCO2 VEN: 40.7 mmHg — AB (ref 45.0–50.0)
PH VEN: 7.373 — AB (ref 7.250–7.300)
PO2 VEN: 35 mmHg (ref 30.0–45.0)
TCO2: 25 mmol/L (ref 0–100)

## 2014-02-06 LAB — PROTIME-INR
INR: 1.09 (ref 0.00–1.49)
Prothrombin Time: 14.1 seconds (ref 11.6–15.2)

## 2014-02-06 LAB — GLUCOSE, CAPILLARY: GLUCOSE-CAPILLARY: 83 mg/dL (ref 70–99)

## 2014-02-06 SURGERY — RIGHT HEART CATH
Anesthesia: LOCAL

## 2014-02-06 MED ORDER — SODIUM CHLORIDE 0.9 % IV SOLN: 250.0000 mL | INTRAVENOUS | Status: DC | PRN

## 2014-02-06 MED ORDER — FENTANYL CITRATE 0.05 MG/ML IJ SOLN
INTRAMUSCULAR | Status: AC
  Filled 2014-02-06: qty 2

## 2014-02-06 MED ORDER — SODIUM CHLORIDE 0.9 % IJ SOLN: 3.0000 mL | INTRAMUSCULAR | Status: DC | PRN

## 2014-02-06 MED ORDER — SODIUM CHLORIDE 0.9 % IJ SOLN
3.0000 mL | INTRAMUSCULAR | Status: DC | PRN
Start: 2014-02-06 — End: 2014-02-06

## 2014-02-06 MED ORDER — SODIUM CHLORIDE 0.9 % IJ SOLN: 3.0000 mL | Freq: Two times a day (BID) | INTRAMUSCULAR | Status: DC

## 2014-02-06 MED ORDER — MIDAZOLAM HCL 2 MG/2ML IJ SOLN
INTRAMUSCULAR | Status: AC
  Filled 2014-02-06: qty 2

## 2014-02-06 MED ORDER — NITROGLYCERIN 1 MG/10 ML FOR IR/CATH LAB
INTRA_ARTERIAL | Status: AC
  Filled 2014-02-06: qty 10

## 2014-02-06 MED ORDER — ACETAMINOPHEN 325 MG PO TABS: 650.0000 mg | ORAL_TABLET | ORAL | Status: DC | PRN

## 2014-02-06 MED ORDER — LIDOCAINE HCL (PF) 1 % IJ SOLN
INTRAMUSCULAR | Status: AC
  Filled 2014-02-06: qty 30

## 2014-02-06 MED ORDER — HEPARIN SODIUM (PORCINE) 1000 UNIT/ML IJ SOLN
INTRAMUSCULAR | Status: AC
  Filled 2014-02-06: qty 1

## 2014-02-06 MED ORDER — HEPARIN (PORCINE) IN NACL 2-0.9 UNIT/ML-% IJ SOLN
INTRAMUSCULAR | Status: AC
  Filled 2014-02-06: qty 500

## 2014-02-06 MED ORDER — ONDANSETRON HCL 4 MG/2ML IJ SOLN: 4.0000 mg | Freq: Four times a day (QID) | INTRAMUSCULAR | Status: DC | PRN

## 2014-02-06 MED ORDER — ASPIRIN 81 MG PO CHEW: 81.0000 mg | CHEWABLE_TABLET | ORAL | Status: DC

## 2014-02-06 MED ORDER — SODIUM CHLORIDE 0.9 % IV SOLN: INTRAVENOUS | Status: DC

## 2014-02-06 NOTE — Discharge Instructions (Signed)
CALL DR MCLEAN'S OFFICE IF ANY PROBLEMS,QUESTIONS, OR CONCERNS; CALL IF ANY BLEEDING,DRAINAGE,FEVER,PAIN,SWELLING OR REDNESS RIGHT ARM

## 2014-02-06 NOTE — Interval H&P Note (Signed)
History and Physical Interval Note:  02/06/2014 9:44 AM  Keith Young  has presented today for surgery, with the diagnosis of CHF  The various methods of treatment have been discussed with the patient and family. After consideration of risks, benefits and other options for treatment, the patient has consented to  Procedure(s): RIGHT HEART CATH (N/A) as a surgical intervention .  The patient's history has been reviewed, patient examined, no change in status, stable for surgery.  I have reviewed the patient's chart and labs.  Questions were answered to the patient's satisfaction.     [name] [employer]

## 2014-02-06 NOTE — CV Procedure (Signed)
    Cardiac Catheterization Procedure Note  Name: Keith Young MRN: [id number] DOB: 09/22/1950  Procedure: Right Heart Cath  Indication: CHF with concern for low output.    Procedural Details: The right brachial area was prepped, draped, and anesthetized with 1% lidocaine. There was a pre-existing peripheral IV that was exchanged for a 7F venous sheath.  A Swan-Ganz catheter was used for the right heart catheterization. Standard protocol was followed for recording of right heart pressures and sampling of oxygen saturations. Fick cardiac output was calculated. There were no immediate procedural complications. The patient was transferred to the post catheterization recovery area for further monitoring.  Procedural Findings: Hemodynamics (mmHg) RA mean 3 RV 35/7 PA 36/14, mean 23 PCWP mean 7  Oxygen saturations: PA 66% AO 95%  Cardiac Output (Fick) 5.77  Cardiac Index (Fick) 3.63   Final Conclusions:  Normal right and left heart filling pressures and preserved cardiac output.  No changes to therapy at this time.   Loralie Champagne 02/06/2014, 10:05 AM

## 2014-02-06 NOTE — Progress Notes (Signed)
RIGHT BRACHIAL SHEATH REMOVED; NO BLEEDING OR HEMATOMA

## 2014-02-06 NOTE — H&P (View-Only) (Signed)
Patient ID: Keith Young, male   DOB: Mar 05, 1951, 63 y.o.   MRN: [id number] PCP: Dr. Luan Pulling Primary Cardiology: Dr. Harl Bowie  63 yo with history of nonischemic cardiomyopathy and aortic valve replacement presents for CHF clinic evaluation.  Patient is followed in [facility] by Dr. Harl Bowie.  He recently moved to [place] from [place] to live in an adult care home run by his sister and brother-in-law.  Most recent echo in 5/15 showed EF 15-20% with RV dysfunction.  He had left heart cath in [place] last year with no significant CAD.  Cause of cardiomyopathy thought to be h/o ETOH abuse + HTN.  He has a [place].  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  He had a CPX done in 8/15 that showed severe functional limitation.    Patient is more interactive today, but most history still comes from his brother-in-law.  He is very inactive.  His gait is unstable but no falls. He is not lightheaded.  He can walk around the adult care home where he lives without significant dyspnea.  He has trouble walking more than about 100 feet, however.  He is short of breath if he walks fast.  No chest pain.  No orthopnea or PND.    Given the severity of functional defect on his CPX, I had Zada Girt talk to him about LVAD.  He had very poor comprehension of what she talked to him about.  His brother-in-law continued to talk for him.   Labs (6/15): K 4.7, creatinine 1.6  PMH: 1. Nonischemic cardiomyopathy: Echo (5/15) with moderate LV dilation, EF 15-20%, grade II diastolic dysfunction, mild MR, moderate-severe LAE, bioprosthetic aortic valve with mean gradient 10 mmHg, moderate RV dilation with moderate to severely decreased systolic function, moderate TR, PA systolic pressure 52 mmHg (similar to prior echoes from [facility] in [place]).  LHC (6/14 in [place]) showed no  significant coronary disease.  History of prior ETOH abuse as well as HTN that may have contributed.  [place].  CPX (8/15): peak VO2 8.9, VE/VCO2 slope 38.1, RER 1.14 (severe functional limitation).  2. ACEI cough.  3. ETOH abuse history 4. 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 5. ? Prior atrial fibrillation but patient has not been anticoagulated and device interrogation has not shown atrial fibrillation.  6. H/o CVA: Followed by neurology in [place].  7. HTN 8. Type II diabetes 9. Hyperlipidemia 10. Bells palsy 11. OSA: Awaiting CPAP. 12. CKD 13. Carotid stenosis: Carotid dopplers (7/15) with < 50% bilateral ICA stenosis.   SH: Prior heavy ETOH, now none.  Quit smoking about 20 years ago.  Lives in [place] at adult care facility run by sister and brother-in-law.    FH: CKD, no cardiomyopathy that he is aware of.   ROS: All systems reviewed and negative except as per HPI.   Current Outpatient Prescriptions  Medication Sig Dispense Refill  . albuterol (PROVENTIL) (2.5 MG/3ML) 0.083% nebulizer solution Take 3 mLs (2.5 mg total) by nebulization every 4 (four) hours as needed for wheezing or shortness of breath.  75 mL  12  . aspirin 81 MG EC tablet Take 1 tablet (81 mg total) by mouth daily.      Marland Kitchen atorvastatin (LIPITOR) 80 MG tablet Take 80 mg by mouth daily.      Marland Kitchen  carvedilol (COREG) 25 MG tablet Take 25 mg by mouth 2 (two) times daily with a meal.       . clopidogrel (PLAVIX) 75 MG tablet Take 75 mg by mouth daily with breakfast.      . ferrous sulfate 325 (65 FE) MG tablet Take 325 mg by mouth daily with breakfast.      . furosemide (LASIX) 80 MG tablet Take 80 mg by mouth 2 (two) times daily.       . insulin lispro (HUMALOG) 100 UNIT/ML injection Inject 2-6 Units into the skin 3 (three) times daily with meals.      . isosorbide-hydrALAZINE (BIDIL) 20-37.5  MG per tablet Take 1 tablet by mouth 3 (three) times daily.  90 tablet  3  . levofloxacin (LEVAQUIN) 500 MG tablet Take 1 tablet (500 mg total) by mouth every other day.  4 tablet  0  . losartan (COZAAR) 25 MG tablet Take 1 tablet (25 mg total) by mouth daily.  30 tablet  6  . traMADol (ULTRAM) 50 MG tablet Take 50 mg by mouth every 6 (six) hours as needed.      . digoxin (LANOXIN) 0.125 MG tablet Take 1 tablet (0.125 mg total) by mouth daily.  30 tablet  6  . spironolactone (ALDACTONE) 25 MG tablet Take 0.5 tablets (12.5 mg total) by mouth daily.  15 tablet  3   No current facility-administered medications for this encounter.    BP 100/60  Pulse 69  Wt 131 lb 1.9 oz (59.476 kg)  SpO2 99% General: NAD Neck: JVP 8 cm, no thyromegaly or thyroid nodule.  Lungs: Clear to auscultation bilaterally with normal respiratory effort. CV: Nondisplaced PMI.  Heart regular S1/S2, no [id number], 2/6 systolic murmur RUSB with clear S2.  No peripheral edema.  Left carotid bruit.  Normal pedal pulses.  Abdomen: Soft, nontender, no hepatosplenomegaly, no distention.  Skin: Intact without lesions or rashes.  Neurologic: Alert and oriented x 3.  Psych: Normal affect. Extremities: No clubbing or cyanosis.   Assessment/Plan:  1. Chronic systolic CHF: Nonischemic cardiomyopathy with biventricular failure, probably due to ETOH abuse (prior) + HTN.  EF 15-20% on last echo with moderate to severe RV dysfunction.  On exam today, he is not significantly volume overloaded.  NYHA class III symptoms.  [place]. CPX showed severe functional limitation.  He tolerated the Bidil that was added at last appointment.  Patient had in-depth discussion with Zada Girt regarding LVAD today.  It does not appear that he has the comprehension to be able to manage an LVAD. He has a history of CVA. I think that we will need to medically manage him. - Continue current Coreg and losartan.  Will not titrate losartan today with CKD.   - Continue Bidil - Add spironolactone 12.5 mg daily and digoxin 0.125 mg daily.  He will need to stop KCl and will get BMET/digoxin level in 1 week.  - I will arrange for RHC in a couple of weeks after med changes to assess cardiac output and filling pressures on this regimen.  2. H/o CVA: He is on Plavix, ASA, and statin.    3. Hyperlipidemia: Goal LDL < 70 with CVA history.  Check lipids at followup. 4. Carotid bruit: On left, has history of CVA.  Mild stenosis on carotid dopplers.   5. CKD: Follow creatinine closely with medication titration.  6. Bioprosthetic aortic valve: There is a question of possible moderate bioprosthetic valve stenosis.  Will need to  follow closely, repeat echo in 5/16.   Followup in 3 wks.   Loralie Champagne 01/21/2014

## 2014-02-11 ENCOUNTER — Encounter (HOSPITAL_COMMUNITY): Payer: Medicare HMO

## 2014-02-19 ENCOUNTER — Ambulatory Visit (HOSPITAL_COMMUNITY)
Admission: RE | Admit: 2014-02-19 | Discharge: 2014-02-19 | Disposition: A | Discharge status: Home / Self Care | Payer: Medicare HMO | Source: Ambulatory Visit | Attending: Internal Medicine | Admitting: Internal Medicine

## 2014-02-19 VITALS — BP 106/50 | HR 65 | Wt 132.5 lb

## 2014-02-19 DIAGNOSIS — I129 Hypertensive chronic kidney disease with stage 1 through stage 4 chronic kidney disease, or unspecified chronic kidney disease: Secondary | ICD-10-CM | POA: Insufficient documentation

## 2014-02-19 DIAGNOSIS — Z79899 Other long term (current) drug therapy: Secondary | ICD-10-CM | POA: Insufficient documentation

## 2014-02-19 DIAGNOSIS — Z7902 Long term (current) use of antithrombotics/antiplatelets: Secondary | ICD-10-CM | POA: Insufficient documentation

## 2014-02-19 DIAGNOSIS — I658 Occlusion and stenosis of other precerebral arteries: Secondary | ICD-10-CM | POA: Diagnosis not present

## 2014-02-19 DIAGNOSIS — G4733 Obstructive sleep apnea (adult) (pediatric): Secondary | ICD-10-CM | POA: Insufficient documentation

## 2014-02-19 DIAGNOSIS — I428 Other cardiomyopathies: Secondary | ICD-10-CM | POA: Diagnosis not present

## 2014-02-19 DIAGNOSIS — I509 Heart failure, unspecified: Secondary | ICD-10-CM | POA: Diagnosis present

## 2014-02-19 DIAGNOSIS — Z7982 Long term (current) use of aspirin: Secondary | ICD-10-CM | POA: Insufficient documentation

## 2014-02-19 DIAGNOSIS — Z8673 Personal history of transient ischemic attack (TIA), and cerebral infarction without residual deficits: Secondary | ICD-10-CM | POA: Diagnosis not present

## 2014-02-19 DIAGNOSIS — I6529 Occlusion and stenosis of unspecified carotid artery: Secondary | ICD-10-CM | POA: Insufficient documentation

## 2014-02-19 DIAGNOSIS — E785 Hyperlipidemia, unspecified: Secondary | ICD-10-CM | POA: Diagnosis not present

## 2014-02-19 DIAGNOSIS — Z794 Long term (current) use of insulin: Secondary | ICD-10-CM | POA: Insufficient documentation

## 2014-02-19 DIAGNOSIS — I5022 Chronic systolic (congestive) heart failure: Secondary | ICD-10-CM

## 2014-02-19 DIAGNOSIS — N189 Chronic kidney disease, unspecified: Secondary | ICD-10-CM | POA: Diagnosis not present

## 2014-02-19 DIAGNOSIS — R0989 Other specified symptoms and signs involving the circulatory and respiratory systems: Secondary | ICD-10-CM | POA: Diagnosis not present

## 2014-02-19 DIAGNOSIS — E119 Type 2 diabetes mellitus without complications: Secondary | ICD-10-CM | POA: Diagnosis not present

## 2014-02-19 DIAGNOSIS — G510 Bell's palsy: Secondary | ICD-10-CM | POA: Diagnosis not present

## 2014-02-19 DIAGNOSIS — Z954 Presence of other heart-valve replacement: Secondary | ICD-10-CM | POA: Insufficient documentation

## 2014-02-19 LAB — BASIC METABOLIC PANEL
Anion gap: 8 (ref 5–15)
BUN: 40 mg/dL — AB (ref 6–23)
CHLORIDE: 106 meq/L (ref 96–112)
CO2: 27 mEq/L (ref 19–32)
Calcium: 8.9 mg/dL (ref 8.4–10.5)
Creatinine, Ser: 1.64 mg/dL — ABNORMAL HIGH (ref 0.50–1.35)
GFR calc Af Amer: 50 mL/min — ABNORMAL LOW (ref >90)
GFR, EST NON AFRICAN AMERICAN: 43 mL/min — AB (ref >90)
GLUCOSE: 163 mg/dL — AB (ref 70–99)
POTASSIUM: 4.8 meq/L (ref 3.7–5.3)
SODIUM: 141 meq/L (ref 137–147)

## 2014-02-19 LAB — DIGOXIN LEVEL: DIGOXIN LVL: 1.3 ng/mL (ref 0.8–2.0)

## 2014-02-19 MED ORDER — ISOSORB DINITRATE-HYDRALAZINE 20-37.5 MG PO TABS: 2.0000 | ORAL_TABLET | Freq: Three times a day (TID) | ORAL | Status: DC

## 2014-02-19 NOTE — Progress Notes (Signed)
Patient ID: Keith Young, male   DOB: 07-09-1950, 63 y.o.   MRN: [id number] PCP: Dr. Luan Pulling Primary Cardiology: Dr. Harl Bowie  63 yo with history of nonischemic cardiomyopathy and aortic valve replacement presents for CHF clinic evaluation.  Patient is followed in [facility] ([place]) by Dr. Harl Bowie.  He recently moved to [place] from [place] to live in an adult care home run by his sister and brother-in-law.  Most recent echo in 5/15 showed EF 15-20% with RV dysfunction.  He had left heart cath in [place] in 2014 with no significant CAD.  Cause of cardiomyopathy thought to be h/o ETOH abuse + HTN.  He has a [place].  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  He had a CPX done in 8/15 that showed severe functional limitation.    Follow-up: Here is here with his brother-in-law. Says he feels good. Walks up and down the driveway which is 35 feet. Says he feels fine if he walks slowly. No SOB, No chest pain.  No orthopnea or PND. No edema Recent RHC cath looked great. (see below)   CPX 8/15 Resting HR: 73 Peak HR: 95 (60% age predicted max HR) BP rest: 118/76 BP peak: 130/68 Peak VO2: 8.9 (28.6% predicted peak VO2) VE/VCO2 slope: 38.1 OUES: 0.80 Peak RER: 1.14 Ventilatory Threshold: Could not be detected VE/MVV: 23.6% PETCO2 at peak: 32 O2pulse: 6 (50% predicted O2pulse)  RHC 02/06/14 Hemodynamics (mmHg)  RA mean 3  RV 35/7  PA 36/14, mean 23  PCWP mean 7  Oxygen saturations:  PA 66%  AO 95%  Cardiac Output (Fick) 5.77  Cardiac Index (Fick) 3.63    Labs (6/15): K 4.7, creatinine 1.6           (9/15): K 4.3 creatinine 1.46  PMH: 1. Nonischemic cardiomyopathy: Echo (5/15) with moderate LV dilation, EF 15-20%, grade II diastolic dysfunction, mild MR, moderate-severe LAE, bioprosthetic aortic valve with mean gradient 10 mmHg, moderate RV dilation with  moderate to severely decreased systolic function, moderate TR, PA systolic pressure 52 mmHg (similar to prior echoes from [facility] in [place]).  LHC (6/14 in [place]) showed no significant coronary disease.  History of prior ETOH abuse as well as HTN that may have contributed.  [place].  CPX (8/15): peak VO2 8.9, VE/VCO2 slope 38.1, RER 1.14 (severe functional limitation).  2. ACEI cough.  3. ETOH abuse history 4. 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 5. ? Prior atrial fibrillation but patient has not been anticoagulated and device interrogation has not shown atrial fibrillation.  6. H/o CVA: Followed by neurology in [place].  7. HTN 8. Type II diabetes 9. Hyperlipidemia 10. Bells palsy 11. OSA: Awaiting CPAP. 12. CKD 13. Carotid stenosis: Carotid dopplers (7/15) with < 50% bilateral ICA stenosis.   SH: Prior heavy ETOH, now none.  Quit smoking about 20 years ago.  Lives in [place] at adult care facility run by sister and brother-in-law.    FH: CKD, no cardiomyopathy that he is aware of.   ROS: All systems reviewed and negative except as per HPI.   Current Outpatient Prescriptions  Medication Sig Dispense Refill  . albuterol (PROVENTIL) (2.5 MG/3ML) 0.083% nebulizer solution Take 3 mLs (2.5 mg total) by nebulization every 4 (four) hours as needed for wheezing or shortness of breath.  75 mL  12  . aspirin 81 MG EC tablet Take 1 tablet (81 mg total) by mouth daily.      Marland Kitchen atorvastatin (LIPITOR) 80 MG tablet Take 80 mg by mouth daily.      . carvedilol (COREG) 25 MG tablet Take 25 mg by mouth 2 (two) times daily with a meal.       . clopidogrel (PLAVIX) 75 MG tablet Take 75 mg by mouth daily with breakfast.      . digoxin (LANOXIN) 0.125 MG tablet Take 1 tablet (0.125 mg total) by mouth daily.  30 tablet  6  . ferrous sulfate 325 (65 FE) MG tablet  Take 325 mg by mouth daily with breakfast.      . furosemide (LASIX) 80 MG tablet Take 80 mg by mouth 2 (two) times daily.       . insulin lispro (HUMALOG) 100 UNIT/ML injection Inject 2-6 Units into the skin 3 (three) times daily with meals.      . isosorbide-hydrALAZINE (BIDIL) 20-37.5 MG per tablet Take 1 tablet by mouth 3 (three) times daily.  90 tablet  3  . losartan (COZAAR) 25 MG tablet Take 1 tablet (25 mg total) by mouth daily.  30 tablet  6  . spironolactone (ALDACTONE) 25 MG tablet Take 0.5 tablets (12.5 mg total) by mouth daily.  15 tablet  3  . traMADol (ULTRAM) 50 MG tablet Take 50 mg by mouth every 6 (six) hours as needed (pain).        No current facility-administered medications for this encounter.    BP 106/50  Pulse 65  Wt 132 lb 8 oz (60.102 kg)  SpO2 98% General: NAD HEENT: L facial droop Neck: JVP flat cm, no thyromegaly or thyroid nodule. R carotid bruit Lungs: Clear to auscultation bilaterally with normal respiratory effort. CV: Nondisplaced PMI.  Heart regular 99991111, +s3 2/6 systolic murmur RUSB with clear S2.  No peripheral edema.  Left carotid bruit.  Normal pedal pulses.  Abdomen: Soft, nontender, no hepatosplenomegaly, no distention.  Skin: Intact without lesions or rashes.  Neurologic: Alert and oriented x 3.  Psych: Normal affect. Extremities: No clubbing or cyanosis.   Assessment/Plan:  1. Chronic systolic CHF: Nonischemic cardiomyopathy with biventricular failure, probably due to ETOH abuse (prior) + HTN.  EF 15-20% on last echo with moderate to severe RV dysfunction.  [place]. CPX showed severe functional limitation.  [place] 9/15 well compensated - He is much improved. RHC results reviewed. Volume status looks great  - Continue current Coreg and losartan.  Will not titrate losartan today with CKD.  - Increase Bidil to 2 tabs TID - Add spironolactone 12.5 mg daily and digoxin 0.125 mg daily. Check BMET and digoxin level - Not VAD candidate  due to cognitive issues and RV failure. 2. H/o CVA: He is on Plavix, ASA, and statin.    3. Hyperlipidemia: Goal LDL < 70 with CVA history.  Check lipids at followup. 4. Carotid bruit: On left, has history of CVA.  Mild stenosis on carotid dopplers.   5. CKD: Follow creatinine closely with medication titration.  6. Bioprosthetic aortic valve: There is a question of possible moderate bioprosthetic valve stenosis.  Will need to follow closely, repeat echo in 5/16.   Followup in 6 wks.   Glori Bickers MD 02/19/2014

## 2014-02-19 NOTE — Patient Instructions (Signed)
Increase Bidil to 2 tabs Three times a day   Labs today  Your physician recommends that you schedule a follow-up appointment in: 6 weeks

## 2014-02-20 ENCOUNTER — Encounter: Payer: Self-pay | Admitting: Cardiology

## 2014-02-21 ENCOUNTER — Telehealth (HOSPITAL_COMMUNITY): Payer: Self-pay | Admitting: *Deleted

## 2014-02-21 MED ORDER — DIGOXIN 125 MCG PO TABS: 0.0625 mg | ORAL_TABLET | Freq: Every day | ORAL | Status: DC

## 2014-02-21 NOTE — Telephone Encounter (Signed)
New rx sent in, pt's caregiver aware order faxed to [facility]

## 2014-02-21 NOTE — Telephone Encounter (Signed)
Message copied by Scarlette Calico on Fri Feb 21, 2014  1:41 PM ------      Message from: Jolaine Artist      Created: Fri Feb 21, 2014 11:35 AM       Please decrease digoxin to 0.0625 daily. Repeat dig level 2 weeks ------

## 2014-03-03 ENCOUNTER — Encounter: Payer: Medicare HMO | Admitting: *Deleted

## 2014-03-10 ENCOUNTER — Encounter: Payer: Self-pay | Admitting: Internal Medicine

## 2014-03-17 ENCOUNTER — Encounter (HOSPITAL_COMMUNITY)
Admission: RE | Admit: 2014-03-17 | Discharge: 2014-03-17 | Disposition: A | Discharge status: Home / Self Care | Payer: Medicare HMO | Source: Ambulatory Visit | Attending: Nephrology | Admitting: Nephrology

## 2014-03-17 DIAGNOSIS — D509 Iron deficiency anemia, unspecified: Secondary | ICD-10-CM | POA: Insufficient documentation

## 2014-03-17 MED ORDER — SODIUM CHLORIDE 0.9 % IV SOLN
510.0000 mg | Freq: Once | INTRAVENOUS | Status: AC
  Administered 2014-03-17: 510 mg via INTRAVENOUS
  Filled 2014-03-17: qty 17

## 2014-03-17 MED ORDER — SODIUM CHLORIDE 0.9 % IV SOLN: INTRAVENOUS | Status: DC

## 2014-03-19 ENCOUNTER — Ambulatory Visit (INDEPENDENT_AMBULATORY_CARE_PROVIDER_SITE_OTHER): Payer: Medicare HMO | Admitting: Gastroenterology

## 2014-03-19 ENCOUNTER — Encounter: Payer: Self-pay | Admitting: Gastroenterology

## 2014-03-19 ENCOUNTER — Other Ambulatory Visit: Payer: Self-pay

## 2014-03-19 VITALS — BP 135/75 | HR 68 | Temp 97.0°F | Ht 62.0 in | Wt 137.0 lb

## 2014-03-19 DIAGNOSIS — R945 Abnormal results of liver function studies: Secondary | ICD-10-CM

## 2014-03-19 DIAGNOSIS — R7989 Other specified abnormal findings of blood chemistry: Secondary | ICD-10-CM

## 2014-03-19 DIAGNOSIS — D631 Anemia in chronic kidney disease: Secondary | ICD-10-CM

## 2014-03-19 DIAGNOSIS — N189 Chronic kidney disease, unspecified: Secondary | ICD-10-CM

## 2014-03-19 MED ORDER — PEG 3350-KCL-NA BICARB-NACL 420 G PO SOLR: 4000.0000 mL | ORAL | Status: DC

## 2014-03-19 NOTE — Patient Instructions (Signed)
We have scheduled you for a colonoscopy and possible upper endoscopy with Dr. Gala Romney in the near future.   Please have blood work done today. We may need further evaluation in the near future.

## 2014-03-19 NOTE — Progress Notes (Signed)
Primary Care Physician:  [name], MD Primary Gastroenterologist:  Dr. Gala Romney   Chief Complaint  Patient presents with  . Colonoscopy    HPI:   Keith Young presents today at the request of Dr. Lowanda Foster for screening colonoscopy and abnormal labs. Appears he has a history of anemia and elevated LFTs.   No prior colonoscopy. No rectal bleeding. No melena. No abdominal pain. No constipation, diarrhea. No loss of appetite or weight loss. No dysphagia. No reflux symptoms. Poor historian.      Past Medical History  Diagnosis Date  . Chronic systolic heart failure     NYHA Class III  . Type 2 diabetes mellitus   . Nonischemic cardiomyopathy     EF 15-20%  . Essential hypertension, benign   . CKD (chronic kidney disease) stage 3, GFR 30-59 ml/min   . Anemia   . Hyperlipidemia   . History of stroke   . Bell's palsy   . Cardiomyopathy   . Aortic insufficiency   . Alcoholism in remission   . Noncompliance with medication regimen   . NSVT (nonsustained ventricular tachycardia)   . Cardiac defibrillator in situ   . COPD (chronic obstructive pulmonary disease)   . CVA (cerebral infarction) 01/2013  . Productive cough 08/2013    With brown sputum   . SOB (shortness of breath) on exertion 08/2013  . Numbness of right jaw     Had a stoke in 01/2013. Numbness is occasional, especially when trying to chew.  . At moderate risk for fall     Past Surgical History  Procedure Laterality Date  . Cardiac defibrillator placement  11/12/12    [employer] Inogen MINI ICD implanted in [place] at [facility]  . Cardiac surgery      bioprostetic AVR per Dr Nelly Laurence note    Current Outpatient Prescriptions  Medication Sig Dispense Refill  . aspirin 81 MG EC tablet Take 1 tablet (81 mg total) by mouth daily.      Marland Kitchen atorvastatin (LIPITOR) 80 MG tablet Take 80 mg by mouth daily.      . carvedilol (COREG) 25 MG tablet Take 25 mg by mouth 2 (two) times daily with a  meal.       . digoxin (LANOXIN) 0.125 MG tablet Take 0.5 tablets (0.0625 mg total) by mouth daily.  15 tablet  6  . ferrous sulfate 325 (65 FE) MG tablet Take 325 mg by mouth daily with breakfast.      . furosemide (LASIX) 80 MG tablet Take 40 mg by mouth 2 (two) times daily.       . insulin lispro (HUMALOG) 100 UNIT/ML injection Inject 2-6 Units into the skin 3 (three) times daily with meals.      . isosorbide-hydrALAZINE (BIDIL) 20-37.5 MG per tablet Take 2 tablets by mouth 3 (three) times daily.  180 tablet  3  . losartan (COZAAR) 25 MG tablet Take 1 tablet (25 mg total) by mouth daily.  30 tablet  6  . spironolactone (ALDACTONE) 25 MG tablet Take 0.5 tablets (12.5 mg total) by mouth daily.  15 tablet  3  . polyethylene glycol-electrolytes (TRILYTE) 420 G solution Take 4,000 mLs by mouth as directed.  4000 mL  0   No current facility-administered medications for this visit.    Allergies as of 03/19/2014  . (No Known Allergies)    Family History  Problem Relation Age of Onset  . Kidney disease Mother   . Kidney  disease Sister   . Colon cancer Neg Hx     History   Social History  . Marital Status: Single    Spouse Name: N/A    Number of Children: N/A  . Years of Education: N/A   Occupational History  . Not on file.   Social History Main Topics  . Smoking status: Former Smoker    Types: Cigarettes    Quit date: 11/05/1993  . Smokeless tobacco: Never Used  . Alcohol Use: Yes     Comment: former heavy ETOH, none recently  . Drug Use: No     Comment: prior history of cocaine use, smoking   . Sexual Activity: Not on file   Other Topics Concern  . Not on file   Social History Narrative   Recently moved from [place] to be with family in [place]                Review of Systems: As mentioned in HPI  Physical Exam: BP 135/75  Pulse 68  Temp(Src) 97 F (36.1 C) (Oral)  Ht 5\' 2"  (1.575 m)  Wt 137 lb (62.143 kg)  BMI 25.05 kg/m2 General:   Alert and  oriented. Pleasant and cooperative. Well-nourished and well-developed.  Head:  Normocephalic and atraumatic. Eyes:  Without icterus, sclera clear and conjunctiva pink.  Ears:  HOH right ear, hearing aide Nose:  No deformity, discharge,  or lesions. Mouth:  Poor dentition Lungs:  Clear to auscultation bilaterally. No wheezes, rales, or rhonchi. No distress.  Heart:  S1 S2 present, 2/6 systolic murmur Abdomen:  +BS, soft, non-tender and non-distended. No HSM noted. No guarding or rebound. No masses appreciated.  Rectal:  Deferred  Msk:  Symmetrical without gross deformities. Normal posture. Extremities:  Without edema. Neurologic:  Alert and  oriented x4;  grossly normal neurologically. Skin:  Intact without significant lesions or rashes. Psych:  Alert and cooperative. Normal mood and affect.  Sept 2015. Ferritin 160, TIBC low at 222. Iron 58. Hgb 9.1  Cr 1.71.   May 2015:  AST 49, ALT 46, AP 265, Tbili 1.0, Direct bilirubin 0.5, indirect 0.5.

## 2014-03-20 LAB — HEPATIC FUNCTION PANEL
ALK PHOS: 99 U/L (ref 39–117)
ALT: 50 U/L (ref 0–53)
AST: 52 U/L — ABNORMAL HIGH (ref 0–37)
Albumin: 3 g/dL — ABNORMAL LOW (ref 3.5–5.2)
BILIRUBIN DIRECT: 0.1 mg/dL (ref 0.0–0.3)
BILIRUBIN TOTAL: 0.3 mg/dL (ref 0.2–1.2)
Indirect Bilirubin: 0.2 mg/dL (ref 0.2–1.2)
Total Protein: 7.9 g/dL (ref 6.0–8.3)

## 2014-03-24 ENCOUNTER — Encounter (HOSPITAL_COMMUNITY)
Admission: RE | Admit: 2014-03-24 | Discharge: 2014-03-24 | Disposition: A | Discharge status: Home / Self Care | Payer: Medicare HMO | Source: Ambulatory Visit | Attending: Nephrology | Admitting: Nephrology

## 2014-03-24 DIAGNOSIS — D509 Iron deficiency anemia, unspecified: Secondary | ICD-10-CM | POA: Diagnosis not present

## 2014-03-24 MED ORDER — SODIUM CHLORIDE 0.9 % IV SOLN
510.0000 mg | Freq: Once | INTRAVENOUS | Status: AC
  Administered 2014-03-24: 510 mg via INTRAVENOUS
  Filled 2014-03-24: qty 17

## 2014-03-24 MED ORDER — SODIUM CHLORIDE 0.9 % IV SOLN
INTRAVENOUS | Status: DC
  Administered 2014-03-24: 250 mL via INTRAVENOUS

## 2014-03-24 NOTE — Progress Notes (Signed)
Here for feraheme infusion. Dx iron deficiency anemia.

## 2014-03-26 ENCOUNTER — Encounter (HOSPITAL_COMMUNITY): Payer: Self-pay | Admitting: Pharmacy Technician

## 2014-03-26 ENCOUNTER — Telehealth: Payer: Self-pay | Admitting: Gastroenterology

## 2014-03-26 DIAGNOSIS — R945 Abnormal results of liver function studies: Secondary | ICD-10-CM

## 2014-03-26 DIAGNOSIS — N189 Chronic kidney disease, unspecified: Principal | ICD-10-CM

## 2014-03-26 DIAGNOSIS — R7989 Other specified abnormal findings of blood chemistry: Secondary | ICD-10-CM | POA: Insufficient documentation

## 2014-03-26 DIAGNOSIS — D631 Anemia in chronic kidney disease: Secondary | ICD-10-CM | POA: Insufficient documentation

## 2014-03-26 NOTE — Telephone Encounter (Signed)
Melinda is aware

## 2014-03-26 NOTE — Assessment & Plan Note (Signed)
May 2015, Alk Phos 265 with mildly elevated transaminases. Recheck now. Predominantly cholestatic pattern.

## 2014-03-26 NOTE — Telephone Encounter (Signed)
Just wanted to make sure endo knew patient has a defibrillator.

## 2014-03-26 NOTE — Assessment & Plan Note (Addendum)
63 year old male with anemia and reports of a possible iron deficiency component; however, ferritin 160, Iron 58 most recently. Hgb 9.1. Appears iron low several months ago; he has been taking iron supplementation. Likely chronic disease as main culprit with early IDA.  He has never had a colonoscopy, needs initial screening +/- EGD with Dr. Gala Romney in the near future. No evidence of overt GI bleeding or concerning upper or lower GI symptoms.   Proceed with TCS+/- EGD with Dr. Gala Romney in near future: the risks, benefits, and alternatives have been discussed with the patient in detail. The patient states understanding and desires to proceed. Patient has defibrillator

## 2014-03-26 NOTE — Progress Notes (Signed)
cc'ed to pcp °

## 2014-03-28 ENCOUNTER — Ambulatory Visit (INDEPENDENT_AMBULATORY_CARE_PROVIDER_SITE_OTHER): Payer: Medicare HMO | Admitting: *Deleted

## 2014-03-28 DIAGNOSIS — I429 Cardiomyopathy, unspecified: Secondary | ICD-10-CM

## 2014-03-28 LAB — MDC_IDC_ENUM_SESS_TYPE_REMOTE
Brady Statistic RV Percent Paced: 0 %
HighPow Impedance: 52 Ohm
Lead Channel Impedance Value: 374 Ohm
Lead Channel Sensing Intrinsic Amplitude: 6.4 mV
Lead Channel Setting Pacing Amplitude: 3 V
MDC IDC MSMT BATTERY REMAINING LONGEVITY: 84 mo
MDC IDC PG SERIAL: 436636
MDC IDC SET LEADCHNL RV PACING PULSEWIDTH: 1.2 ms

## 2014-03-31 NOTE — Progress Notes (Signed)
Remote ICD transmission.   

## 2014-04-01 ENCOUNTER — Other Ambulatory Visit: Payer: Self-pay | Admitting: Internal Medicine

## 2014-04-01 ENCOUNTER — Encounter (HOSPITAL_COMMUNITY): Payer: Medicare HMO

## 2014-04-02 ENCOUNTER — Encounter (HOSPITAL_COMMUNITY): Payer: Self-pay

## 2014-04-02 ENCOUNTER — Ambulatory Visit (HOSPITAL_COMMUNITY)
Admission: RE | Admit: 2014-04-02 | Discharge: 2014-04-02 | Disposition: A | Discharge status: Home / Self Care | Payer: Medicare HMO | Source: Ambulatory Visit | Attending: Cardiology | Admitting: Cardiology

## 2014-04-02 VITALS — BP 138/70 | HR 69 | Wt 137.8 lb

## 2014-04-02 DIAGNOSIS — R011 Cardiac murmur, unspecified: Secondary | ICD-10-CM | POA: Insufficient documentation

## 2014-04-02 DIAGNOSIS — N183 Chronic kidney disease, stage 3 (moderate): Secondary | ICD-10-CM

## 2014-04-02 DIAGNOSIS — N189 Chronic kidney disease, unspecified: Secondary | ICD-10-CM | POA: Diagnosis not present

## 2014-04-02 DIAGNOSIS — I5022 Chronic systolic (congestive) heart failure: Secondary | ICD-10-CM | POA: Diagnosis present

## 2014-04-02 DIAGNOSIS — E785 Hyperlipidemia, unspecified: Secondary | ICD-10-CM | POA: Diagnosis not present

## 2014-04-02 DIAGNOSIS — Z8673 Personal history of transient ischemic attack (TIA), and cerebral infarction without residual deficits: Secondary | ICD-10-CM

## 2014-04-02 LAB — DIGOXIN LEVEL: Digoxin Level: 0.6 ng/mL — ABNORMAL LOW (ref 0.8–2.0)

## 2014-04-02 LAB — BASIC METABOLIC PANEL
Anion gap: 11 (ref 5–15)
BUN: 33 mg/dL — AB (ref 6–23)
CO2: 22 mEq/L (ref 19–32)
Calcium: 9.2 mg/dL (ref 8.4–10.5)
Chloride: 105 mEq/L (ref 96–112)
Creatinine, Ser: 1.49 mg/dL — ABNORMAL HIGH (ref 0.50–1.35)
GFR calc Af Amer: 56 mL/min — ABNORMAL LOW (ref >90)
GFR calc non Af Amer: 49 mL/min — ABNORMAL LOW (ref >90)
GLUCOSE: 97 mg/dL (ref 70–99)
Potassium: 4.4 mEq/L (ref 3.7–5.3)
Sodium: 138 mEq/L (ref 137–147)

## 2014-04-02 NOTE — Patient Instructions (Signed)
Labs today  We will contact you in 3 months to schedule your next appointment.  

## 2014-04-02 NOTE — Progress Notes (Signed)
Patient ID: Keith Young, male   DOB: 1950/10/25, 63 y.o.   MRN: [id number] PCP: Dr. Luan Pulling Primary Cardiology: Dr. Harl Bowie  63 yo with history of nonischemic cardiomyopathy and aortic valve replacement presents for CHF clinic evaluation.  Patient is followed in [facility] by Dr. Harl Bowie.  He recently moved to [place] from [place] to live in an adult care home run by his sister and brother-in-law.  Most recent echo in 5/15 showed EF 15-20% with RV dysfunction.  He had left heart cath in [place] in 2014 with no significant CAD.  Cause of cardiomyopathy thought to be h/o ETOH abuse + HTN.  He has a [place].  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  He had a CPX done in 8/15 that showed severe functional limitation, but RHC in 9/15 showed normal filling pressures and preserved cardiac output.   Follow-up: He is here with his brother-in-law. Says he feels good. Walks up and down the driveway which is 35 feet. Says he feels fine if he walks slowly. No SOB, no chest pain.  No orthopnea or PND. No edema.  Weight is up a few lbs.  Digoxin level was elevated when checked last so digoxin dose was cut back.    CPX 8/15 Resting HR: 73 Peak HR: 95 (60% age predicted max HR) BP rest: 118/76 BP peak: 130/68 Peak VO2: 8.9 (28.6% predicted peak VO2) VE/VCO2 slope: 38.1 OUES: 0.80 Peak RER: 1.14 Ventilatory Threshold: Could not be detected VE/MVV: 23.6% PETCO2 at peak: 32 O2pulse: 6 (50% predicted O2pulse)  RHC 02/06/14 Hemodynamics (mmHg)  RA mean 3  RV 35/7  PA 36/14, mean 23  PCWP mean 7  Oxygen saturations:  PA 66%  AO 95%  Cardiac Output (Fick) 5.77  Cardiac Index (Fick) 3.63   Labs (6/15): K 4.7, creatinine 1.6 Labs (9/15): K 4.3=>4.8, creatinine 1.46 => 1.64, digoxin 1.3  PMH: 1. Nonischemic cardiomyopathy: Echo (5/15) with moderate LV dilation, EF  15-20%, grade II diastolic dysfunction, mild MR, moderate-severe LAE, bioprosthetic aortic valve with mean gradient 10 mmHg, moderate RV dilation with moderate to severely decreased systolic function, moderate TR, PA systolic pressure 52 mmHg (similar to prior echoes from [facility] in [place]).  LHC (6/14 in [place]) showed no significant coronary disease.  History of prior ETOH abuse as well as HTN that may have contributed.  [place].  CPX (8/15): peak VO2 8.9, VE/VCO2 slope 38.1, RER 1.14 (severe functional limitation).  RHC (9/15) with mean RA 3, PA 36/14, mean PCWP 7, CI 3.63.  2. ACEI cough.  3. ETOH abuse history 4. 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 5. ? Prior atrial fibrillation but patient has not been anticoagulated and device interrogation has not shown atrial fibrillation.  6. H/o CVA: Followed by neurology in [place].  7. HTN 8. Type II diabetes 9. Hyperlipidemia 10. Bells palsy 11. OSA: Awaiting CPAP. 12. CKD 13. Carotid stenosis: Carotid dopplers (7/15) with < 50% bilateral ICA stenosis.   SH: Prior heavy ETOH, now none.  Quit smoking about 20 years ago.  Lives in [place] at adult care facility run by sister and brother-in-law.    FH: CKD, no cardiomyopathy that he is aware of.   ROS: All systems reviewed and negative except as per HPI.   Current Outpatient Prescriptions  Medication Sig Dispense Refill  . aspirin 81 MG EC tablet Take 1 tablet (81 mg total) by mouth daily.    Marland Kitchen atorvastatin (LIPITOR) 80 MG tablet Take 80 mg by mouth daily.    . carvedilol (COREG) 25 MG tablet Take 12.5 mg by mouth 2 (two) times daily with a meal.     . digoxin (LANOXIN) 0.125 MG tablet Take 0.5 tablets (0.0625 mg total) by mouth daily. 15 tablet 6  . ferrous sulfate 325 (65 FE) MG tablet Take 325 mg by mouth daily with breakfast.    . furosemide  (LASIX) 80 MG tablet Take 40 mg by mouth 2 (two) times daily.     . hydrALAZINE (APRESOLINE) 25 MG tablet Take 3 tablets by mouth 3 (three) times daily.    . insulin lispro (HUMALOG) 100 UNIT/ML injection Inject 2-6 Units into the skin 3 (three) times daily with meals.    . isosorbide dinitrate (ISORDIL) 20 MG tablet Take 2 tablets by mouth 3 (three) times daily.    Marland Kitchen losartan (COZAAR) 25 MG tablet Take 1 tablet (25 mg total) by mouth daily. 30 tablet 6  . polyethylene glycol-electrolytes (TRILYTE) 420 G solution Take 4,000 mLs by mouth as directed. 4000 mL 0  . spironolactone (ALDACTONE) 25 MG tablet Take 0.5 tablets (12.5 mg total) by mouth daily. 15 tablet 3   No current facility-administered medications for this encounter.    BP 138/70 mmHg  Pulse 69  Wt 137 lb 12.8 oz (62.506 kg)  SpO2 97% General: NAD HEENT: L facial droop Neck: JVP flat cm, no thyromegaly or thyroid nodule. R carotid bruit Lungs: Clear to auscultation bilaterally with normal respiratory effort. CV: Nondisplaced PMI.  Heart regular S1/S2, no [id number], 2/6 systolic murmur RUSB with clear S2.  No peripheral edema.  Left carotid bruit.  Normal pedal pulses.  Abdomen: Soft, nontender, no hepatosplenomegaly, no distention.  Skin: Intact without lesions or rashes.  Neurologic: Alert and oriented x 3.  Psych: Normal affect. Extremities: No clubbing or cyanosis.   Assessment/Plan:  1. Chronic systolic CHF: Nonischemic cardiomyopathy with biventricular failure, probably due to ETOH abuse (prior) + HTN.  EF 15-20% on last echo with moderate to severe RV dysfunction.  [place]. CPX showed severe functional limitation.  RHC 9/15, however, was well compensated with good cardiac output.  Symptomatically he is doing well.  He is not volume overloaded.  - Continue current Coreg, digoxin, spironolactone, and losartan.  Will not titrate losartan today with CKD.  Check BMET and digoxin level today.  Digoxin recently  decreased - Continue hydralazine/isordil.  - Not VAD candidate due to cognitive issues and RV failure. 2. H/o CVA: He is on Plavix, ASA, and statin.    3. Hyperlipidemia: Goal LDL < 70 with CVA history.  Check lipids at followup. 4. Carotid bruit: On left, has history of CVA.  Mild stenosis on carotid dopplers.   5. CKD: Follow creatinine closely with medication titration.  BMET today.  6. Bioprosthetic aortic valve: There is a question of possible moderate bioprosthetic valve stenosis.  Will need to follow closely, repeat echo in 5/16.   Followup in 3 months  Loralie Champagne MD 04/02/2014

## 2014-04-04 ENCOUNTER — Encounter (HOSPITAL_COMMUNITY): Payer: Self-pay

## 2014-04-04 ENCOUNTER — Encounter (HOSPITAL_COMMUNITY)
Admission: RE | Admit: 2014-04-04 | Discharge: 2014-04-04 | Disposition: A | Discharge status: Home / Self Care | Payer: Medicare HMO | Source: Ambulatory Visit | Attending: Internal Medicine | Admitting: Internal Medicine

## 2014-04-04 ENCOUNTER — Telehealth: Payer: Self-pay

## 2014-04-04 DIAGNOSIS — Z01812 Encounter for preprocedural laboratory examination: Secondary | ICD-10-CM | POA: Insufficient documentation

## 2014-04-04 DIAGNOSIS — N189 Chronic kidney disease, unspecified: Secondary | ICD-10-CM | POA: Diagnosis not present

## 2014-04-04 HISTORY — DX: Unspecified osteoarthritis, unspecified site: M19.90

## 2014-04-04 HISTORY — DX: Presence of automatic (implantable) cardiac defibrillator: Z95.810

## 2014-04-04 LAB — HEMOGLOBIN AND HEMATOCRIT, BLOOD
HCT: 28.5 % — ABNORMAL LOW (ref 39.0–52.0)
Hemoglobin: 9.1 g/dL — ABNORMAL LOW (ref 13.0–17.0)

## 2014-04-04 LAB — BASIC METABOLIC PANEL
Anion gap: 9 (ref 5–15)
BUN: 36 mg/dL — AB (ref 6–23)
CHLORIDE: 105 meq/L (ref 96–112)
CO2: 26 meq/L (ref 19–32)
Calcium: 9.1 mg/dL (ref 8.4–10.5)
Creatinine, Ser: 1.89 mg/dL — ABNORMAL HIGH (ref 0.50–1.35)
GFR calc Af Amer: 42 mL/min — ABNORMAL LOW (ref >90)
GFR calc non Af Amer: 36 mL/min — ABNORMAL LOW (ref >90)
Glucose, Bld: 146 mg/dL — ABNORMAL HIGH (ref 70–99)
POTASSIUM: 4.8 meq/L (ref 3.7–5.3)
Sodium: 140 mEq/L (ref 137–147)

## 2014-04-04 NOTE — Patient Instructions (Signed)
Keith Young  04/04/2014   Your procedure is scheduled on:  04/10/2014  Report to [facility] at  56  AM.  Call this number if you have problems the morning of surgery: [phone]   Remember:   Do not eat food or drink liquids after midnight.   Take these medicines the morning of surgery with A SIP OF WATER:  Carvedilol, digoxin, hydrocodone, isosorbibe, losartan   Do not wear jewelry, make-up or nail polish.  Do not wear lotions, powders, or perfumes.   Do not shave 48 hours prior to surgery. Men may shave face and neck.  Do not bring valuables to the hospital.  [facility] is not responsible for any belongings or valuables.               Contacts, dentures or bridgework may not be worn into surgery.  Leave suitcase in the car. After surgery it may be brought to your room.  For patients admitted to the hospital, discharge time is determined by your treatment team.               Patients discharged the day of surgery will not be allowed to drive home.  Name and phone number of your driver: family  Special Instructions: N/A   Please read over the following fact sheets that you were given: Pain Booklet, Coughing and Deep Breathing, Surgical Site Infection Prevention, Anesthesia Post-op Instructions and Care and Recovery After Surgery Colonoscopy A colonoscopy is an exam to look at the entire large intestine (colon). This exam can help find problems such as tumors, polyps, inflammation, and areas of bleeding. The exam takes about 1 hour.  LET [facility] CARE PROVIDER KNOW ABOUT:   Any allergies you have.  All medicines you are taking, including vitamins, herbs, eye drops, creams, and over-the-counter medicines.  Previous problems you or members of your family have had with the use of anesthetics.  Any blood disorders you have.  Previous surgeries you have had.  Medical conditions you have. RISKS AND COMPLICATIONS  Generally, this is a safe procedure. However, as  with any procedure, complications can occur. Possible complications include:  Bleeding.  Tearing or rupture of the colon wall.  Reaction to medicines given during the exam.  Infection (rare). BEFORE THE PROCEDURE   Ask your health care provider about changing or stopping your regular medicines.  You may be prescribed an oral bowel prep. This involves drinking a large amount of medicated liquid, starting the day before your procedure. The liquid will cause you to have multiple loose stools until your stool is almost clear or light green. This cleans out your colon in preparation for the procedure.  Do not eat or drink anything else once you have started the bowel prep, unless your health care provider tells you it is safe to do so.  Arrange for someone to drive you home after the procedure. PROCEDURE   You will be given medicine to help you relax (sedative).  You will lie on your side with your knees bent.  A long, flexible tube with a light and camera on the end (colonoscope) will be inserted through the rectum and into the colon. The camera sends video back to a computer screen as it moves through the colon. The colonoscope also releases carbon dioxide gas to inflate the colon. This helps your health care provider see the area better.  During the exam, your health care provider may take a small  tissue sample (biopsy) to be examined under a microscope if any abnormalities are found.  The exam is finished when the entire colon has been viewed. AFTER THE PROCEDURE   Do not drive for 24 hours after the exam.  You may have a small amount of blood in your stool.  You may pass moderate amounts of gas and have mild abdominal cramping or bloating. This is caused by the gas used to inflate your colon during the exam.  Ask when your test results will be ready and how you will get your results. Make sure you get your test results. Document Released: 05/13/2000 Document Revised: 03/06/2013  Document Reviewed: 01/21/2013 [facility] Patient Information 2015 [place], [place]. This information is not intended to replace advice given to you by your health care provider. Make sure you discuss any questions you have with your health care provider. Esophagogastroduodenoscopy Esophagogastroduodenoscopy (EGD) is a procedure to examine the lining of the esophagus, stomach, and first part of the small intestine (duodenum). A long, flexible, lighted tube with a camera attached (endoscope) is inserted down the throat to view these organs. This procedure is done to detect problems or abnormalities, such as inflammation, bleeding, ulcers, or growths, in order to treat them. The procedure lasts about 5-20 minutes. It is usually an outpatient procedure, but it may need to be performed in emergency cases in the hospital. LET YOUR CAREGIVER KNOW ABOUT:   Allergies to food or medicine.  All medicines you are taking, including vitamins, herbs, eyedrops, and over-the-counter medicines and creams.  Use of steroids (by mouth or creams).  Previous problems you or members of your family have had with the use of anesthetics.  Any blood disorders you have.  Previous surgeries you have had.  Other health problems you have.  Possibility of pregnancy, if this applies. RISKS AND COMPLICATIONS  Generally, EGD is a safe procedure. However, as with any procedure, complications can occur. Possible complications include:  Infection.  Bleeding.  Tearing (perforation) of the esophagus, stomach, or duodenum.  Difficulty breathing or not being able to breath.  Excessive sweating.  Spasms of the larynx.  Slowed heartbeat.  Low blood pressure. BEFORE THE PROCEDURE  Do not eat or drink anything for 6-8 hours before the procedure or as directed by your caregiver.  Ask your caregiver about changing or stopping your regular medicines.  If you wear dentures, be prepared to remove them before the  procedure.  Arrange for someone to drive you home after the procedure. PROCEDURE   A vein will be accessed to give medicines and fluids. A medicine to relax you (sedative) and a pain reliever will be given through that access into the vein.  A numbing medicine (local anesthetic) may be sprayed on your throat for comfort and to stop you from gagging or coughing.  A mouth guard may be placed in your mouth to protect your teeth and to keep you from biting on the endoscope.  You will be asked to lie on your left side.  The endoscope is inserted down your throat and into the esophagus, stomach, and duodenum.  Air is put through the endoscope to allow your caregiver to view the lining of your esophagus clearly.  The esophagus, stomach, and duodenum is then examined. During the exam, your caregiver may:  Remove tissue to be examined under a microscope (biopsy) for inflammation, infection, or other medical problems.  Remove growths.  Remove objects (foreign bodies) that are stuck.  Treat any bleeding with medicines or  other devices that stop tissues from bleeding (hot cautery, clipping devices).  Widen (dilate) or stretch narrowed areas of the esophagus and stomach.  The endoscope will then be withdrawn. AFTER THE PROCEDURE  You will be taken to a recovery area to be monitored. You will be able to go home once you are stable and alert.  Do not eat or drink anything until the local anesthetic and numbing medicines have worn off. You may choke.  It is normal to feel bloated, have pain with swallowing, or have a sore throat for a short time. This will wear off.  Your caregiver should be able to discuss his or her findings with you. It will take longer to discuss the test results if any biopsies were taken. Document Released: 09/16/2004 Document Revised: 09/30/2013 Document Reviewed: 04/18/2012 [facility] Patient Information 2015 [place], [place]. This information is not intended to replace  advice given to you by your health care provider. Make sure you discuss any questions you have with your health care provider. PATIENT INSTRUCTIONS POST-ANESTHESIA  IMMEDIATELY FOLLOWING SURGERY:  Do not drive or operate machinery for the first twenty four hours after surgery.  Do not make any important decisions for twenty four hours after surgery or while taking narcotic pain medications or sedatives.  If you develop intractable nausea and vomiting or a severe headache please notify your doctor immediately.  FOLLOW-UP:  Please make an appointment with your surgeon as instructed. You do not need to follow up with anesthesia unless specifically instructed to do so.  WOUND CARE INSTRUCTIONS (if applicable):  Keep a dry clean dressing on the anesthesia/puncture wound site if there is drainage.  Once the wound has quit draining you may leave it open to air.  Generally you should leave the bandage intact for twenty four hours unless there is drainage.  If the epidural site drains for more than 36-48 hours please call the anesthesia department.  QUESTIONS?:  Please feel free to call your physician or the hospital operator if you have any questions, and they will be happy to assist you.

## 2014-04-04 NOTE — Pre-Procedure Instructions (Signed)
Patient and family givrn information to sign up for my chart at home.

## 2014-04-04 NOTE — Pre-Procedure Instructions (Signed)
Patient in for PAT and patient states he has Kiron. Spoke with Almyra Free at Dr Roseanne Kaufman office to make sure they understood that they needed to contact [employer] to suspend ICD day of procedure. Almyra Free states she will leave a note for Ginger to make suret this hs been done.

## 2014-04-04 NOTE — Telephone Encounter (Signed)
Kim from pre-op called- pt is scheduled for procedures in the OR- he has a defibrillator [employer]) and they wanted to make sure that you called the rep and that they know they need to come out and suspend his device on the day of his procedures.

## 2014-04-07 ENCOUNTER — Encounter: Payer: Medicare HMO | Admitting: Cardiology

## 2014-04-07 NOTE — Progress Notes (Signed)
Clinical Summary Mr. Nanna is a 63 y.o.male seen today for follow up of the following medical problems.  1. Chronic systolic heart failure/NICM  - most of his previous care had been at [facility] in [place].  - echo 10/24/13 LVEF [id number] II diastolic dysfunction, multiple WMAs. +spontaneous echo constrast suggestive of low cardiac output. Function appears fairly stable from prior echoes from [facility]  - cath at [place] (in epic media section) 10/2012 with patent coronaries.  - Appears medical management has been limited due to medication non-compliance and polysubstance abuse - he has prior [place] scientific ICD placed at [place],  seen by EP just a few days ago, normal function on interrogation  - followed in CHF clinic, last visit 04/02/14  - reports DOE at 1 block which somewhat better than before. No LE edema, no orthopnea, no PND. No chest pain - limiting sodium intake, avoiding NSAIDs - weighs himself daily, typically 127 lbs and has been stable.   - ACE stopped during admission due to concern for cough. Last visit started losartan, K increased but within normal limites. Stable Cr around 1.6.   2. CKD  - baseline Cr around 1.5-1.8, stable on last check  3. Aortic stenosis  - history of bioprosthetic AVR. He reports was done at [facility], approx 3 years but is not sure. We do not have these records at this time - echo 09/2013 showed heavily calcified prosthesis, mean grad of 10 which is stable from prior echoes. Gradient may be underestimated due to low output. From my review dimensionless index of 0.30 more consistent with moderate AS.   4. Afib - old records mention history of afib, not clearly described.  - he has not been on any anticoagulation, he is not sure if he ever has been - recent device check without any noted afib episodes.   5. OSA Sleep study by [facility] Jan 2015 with mild OSA, recs to consider testing  with nasal CPAP, unclear if this was done. - reports he has follow up with Dr Arsenio Katz for his hx of CVA, he is to discuss there  6. CVA - history unclear, he is on ASA and plavix for seconary prevetion. He is due to see neurology here in [facility]  Past Medical History  Diagnosis Date  . Chronic systolic heart failure     NYHA Class III  . Type 2 diabetes mellitus   . Nonischemic cardiomyopathy     EF 15-20%  . Essential hypertension, benign   . CKD (chronic kidney disease) stage 3, GFR 30-59 ml/min   . Anemia   . Hyperlipidemia   . History of stroke   . Bell's palsy   . Cardiomyopathy   . Aortic insufficiency   . Alcoholism in remission   . Noncompliance with medication regimen   . NSVT (nonsustained ventricular tachycardia)   . Cardiac defibrillator in situ   . COPD (chronic obstructive pulmonary disease)   . CVA (cerebral infarction) 01/2013  . Productive cough 08/2013    With brown sputum   . SOB (shortness of breath) on exertion 08/2013  . Numbness of right jaw     Had a stoke in 01/2013. Numbness is occasional, especially when trying to chew.  . At moderate risk for fall   . AICD (automatic cardioverter/defibrillator) present   . Stroke     weakness of right side from CVA  . Arthritis      No Known Allergies  Current Outpatient Prescriptions  Medication Sig Dispense Refill  . aspirin 81 MG EC tablet Take 1 tablet (81 mg total) by mouth daily.    Marland Kitchen atorvastatin (LIPITOR) 80 MG tablet Take 80 mg by mouth daily.    . carvedilol (COREG) 25 MG tablet Take 12.5 mg by mouth 2 (two) times daily with a meal.     . digoxin (LANOXIN) 0.125 MG tablet Take 0.5 tablets (0.0625 mg total) by mouth daily. 15 tablet 6  . ferrous sulfate 325 (65 FE) MG tablet Take 325 mg by mouth daily with breakfast.    . furosemide (LASIX) 80 MG tablet Take 40 mg by mouth 2 (two) times daily.     . hydrALAZINE (APRESOLINE) 25 MG tablet Take 3 tablets by mouth 3 (three) times daily.    .  insulin lispro (HUMALOG) 100 UNIT/ML injection Inject 2-6 Units into the skin 3 (three) times daily with meals.    . isosorbide dinitrate (ISORDIL) 20 MG tablet Take 2 tablets by mouth 3 (three) times daily.    Marland Kitchen losartan (COZAAR) 25 MG tablet Take 1 tablet (25 mg total) by mouth daily. 30 tablet 6  . polyethylene glycol-electrolytes (TRILYTE) 420 G solution Take 4,000 mLs by mouth as directed. 4000 mL 0  . spironolactone (ALDACTONE) 25 MG tablet Take 0.5 tablets (12.5 mg total) by mouth daily. 15 tablet 3   No current facility-administered medications for this visit.     Past Surgical History  Procedure Laterality Date  . Cardiac defibrillator placement  11/12/12    [employer] Inogen MINI ICD implanted in [place] at [facility]  . Cardiac surgery      bioprostetic AVR per Dr Nelly Laurence note     No Known Allergies    Family History  Problem Relation Age of Onset  . Kidney disease Mother   . Kidney disease Sister   . Colon cancer Neg Hx      Social History Mr. Pesicka reports that he quit smoking about 20 years ago. His smoking use included Cigarettes. He smoked 0.00 packs per day. He has never used smokeless tobacco. Mr. Gonyer reports that he drinks alcohol.   Review of Systems CONSTITUTIONAL: No weight loss, fever, chills, weakness or fatigue.  HEENT: Eyes: No visual loss, blurred vision, double vision or yellow sclerae.No hearing loss, sneezing, congestion, runny nose or sore throat.  SKIN: No rash or itching.  CARDIOVASCULAR:  RESPIRATORY: No shortness of breath, cough or sputum.  GASTROINTESTINAL: No anorexia, nausea, vomiting or diarrhea. No abdominal pain or blood.  GENITOURINARY: No burning on urination, no polyuria NEUROLOGICAL: No headache, dizziness, syncope, paralysis, ataxia, numbness or tingling in the extremities. No change in bowel or bladder control.  MUSCULOSKELETAL: No muscle, back pain, joint pain or stiffness.  LYMPHATICS: No enlarged  nodes. No history of splenectomy.  PSYCHIATRIC: No history of depression or anxiety.  ENDOCRINOLOGIC: No reports of sweating, cold or heat intolerance. No polyuria or polydipsia.  Marland Kitchen   Physical Examination There were no vitals filed for this visit. There were no vitals filed for this visit.  Gen: resting comfortably, no acute distress HEENT: no scleral icterus, pupils equal round and reactive, no palptable cervical adenopathy,  CV Resp: Clear to auscultation bilaterally GI: abdomen is soft, non-tender, non-distended, normal bowel sounds, no hepatosplenomegaly MSK: extremities are warm, no edema.  Skin: warm, no rash Neuro:  no focal deficits Psych: appropriate affect   Diagnostic Studies 10/24/13 Echo  Study Conclusions  - Left ventricle: The cavity  size was moderately dilated. Wall thickness was increased in a pattern of mild LVH. Systolic function was severely reduced. The estimated ejection fraction was in the range of 15% to 20%. Spontaneous echocontrast and low stroke volume consistent with low cardiac output. There is akinesis of the anteroseptal, anterior, mid to distal inferior, inferoseptal, and apical myocardium. Features are consistent with a pseudonormal left ventricular filling pattern, with concomitant abnormal relaxation and increased filling pressure (grade 2 diastolic dysfunction). Doppler parameters are consistent with elevated ventricular end-diastolic filling pressure. - Ventricular septum: Septal motion showed abnormal function and dyssynergy. - Aortic valve: Appears heavily calcified with poorly defined leaflet structure - cannot completely exclude stented bioprosthesis, but history at this point does not confirm this. There was possible moderate to severe stenosis, low gradient. There was no significant regurgitation. Mean gradient (S): 10 mm Hg. Peak gradient (S): 18 mm Hg. Valve area (VTI): 1.09 cm^2. Valve area (Vmax): 1 cm^2. LVOT/AV VTI ratio  0.31. - Mitral valve: Mildly thickened leaflets . There was mild regurgitation. - Left atrium: The atrium was moderately to severely dilated. - Right ventricle: The cavity size was moderately dilated. Pacer wire or catheter noted in right ventricle. Systolic function was moderately to severely reduced. - Right atrium: The atrium was mildly to moderately dilated. Central venous pressure (est): 15 mm Hg. - Tricuspid valve: There was moderate regurgitation. - Pulmonic valve: There was mild regurgitation. - Pulmonary arteries: PA peak pressure: 52 mm Hg (S). - Pericardium, extracardiac: There was no pericardial effusion.  Impressions:  - Moderate LV dilatation with mild LVH and LVEF approximately 15-20% and low cardiac output. Wall motion abnormalities and abnormal septal motion as outlined. Grade 2 diastolic dysfunction with increased filling pressures. Moderate to severe left atrial enlargement. Mild mitral regurgitation. Abnormal aortic valve as defined above - cannot exclude significant aortic stenosis based on available information. Device wire in right heart. Severe RV dysfunction. Moderate tricuspid regurgitation with PASP 52 mmHg and elevated CVP.  ICD information  He has appt scheduled on June 18, [employer] # [id number]. Lead [facility] #[id number] [facility] serial number [id number]. Placed November 13, 2012. I have asked that records be sent to our office in [place] and [place] for further information to help with history and records.   Cath 10/2012 [facility] "Patent coronaries"     Assessment and Plan  1. Chronic systolic heart failure/ NICM  - long history with most of his management in [place], [place] at [facility]  - most recent echo 5/2015LVEF 15-20%, severe RV dysfunction, he is NYHA III, with a [employer] ICD.  - will not further titrate ARB due to his renal dysfunction. Avoiding aldactone.  - likely initiate hydral/nitrates at next visit -  he has very severe biventricular failure and probably moderate if not severe AS of his prosthetic aortic valve. Given his complex heart failure will refer to CHF clinic for assistance with management. He may need consideration of advanced therapies in the near future given his severe disease.  2. CKD - renal function remains stable, continue to follow  3. Aortic stenosis - request records from AVR surgery to clarify the timing and type of valve placed. I would be surprised if this degree of calcifications if just 3 years ago - morphologically stenosis appears severe, dimensionless index 0.30. Gradient understated in setting of severe LV dysfunction - once records available, consider further evaluation with possible TEE, vs possible dobutamine AV study. Will f/u thoughts of CHF clinic. Not sure he would be  a candidate for redo procedure, or if considered for potential assist device in the near future if even warranted evaluating further.   4. Afib? - mentioned briefly in notes, unclear history. Has not been on anticoag. No afib noted on device check - continue to follow  5. Hx of CVA - f/u with neuro, defer dosing and use of ASA and plavix to them  6. OSA - patient is to f/u with Dr Arsenio Katz and discuss findings of prior testing.   7. HTN - weaning clonidine, decreased to 0.1 patch. Discontinue next visit.  - continue other meds, likely start hydral/nitrates at next visit.       Arnoldo Lenis, M.D.

## 2014-04-08 NOTE — Telephone Encounter (Signed)
Talked with Keith Young and he said that all the OR staff needed to do was to place the magent over his defibrillator and it would beep once every 2 seconds. If you have an question you can call Keith Young at [phone].

## 2014-04-08 NOTE — Telephone Encounter (Signed)
Left message for Keith Young([phone]). His is the rep for [employer].

## 2014-04-09 NOTE — Progress Notes (Signed)
Quick Note:  Updated LFTs reviewed. Alk Phos now normalized. AST mildly elevated at 52.  Recommend US abdomen. Recheck LFTs in 3 months. Proceed with TCS+/- EGD as planned. ______

## 2014-04-09 NOTE — Progress Notes (Signed)
Quick Note:  Labs reviewed recently performed. Hgb stable at baseline. Cr 1.89, with history of chronic kidney disease. Outside labs as noted with Cr 1.71. ______

## 2014-04-10 ENCOUNTER — Ambulatory Visit (HOSPITAL_COMMUNITY)
Admission: RE | Admit: 2014-04-10 | Discharge: 2014-04-10 | Disposition: A | Discharge status: Home / Self Care | Payer: Medicare HMO | Source: Ambulatory Visit | Attending: Internal Medicine | Admitting: Internal Medicine

## 2014-04-10 ENCOUNTER — Encounter (HOSPITAL_COMMUNITY)
Admission: RE | Disposition: A | Discharge status: Home / Self Care | Payer: Self-pay | Source: Ambulatory Visit | Attending: Internal Medicine

## 2014-04-10 ENCOUNTER — Ambulatory Visit (HOSPITAL_COMMUNITY): Payer: Medicare HMO | Admitting: Anesthesiology

## 2014-04-10 ENCOUNTER — Encounter (HOSPITAL_COMMUNITY): Payer: Self-pay | Admitting: *Deleted

## 2014-04-10 DIAGNOSIS — K5730 Diverticulosis of large intestine without perforation or abscess without bleeding: Secondary | ICD-10-CM | POA: Diagnosis not present

## 2014-04-10 DIAGNOSIS — I5022 Chronic systolic (congestive) heart failure: Secondary | ICD-10-CM | POA: Insufficient documentation

## 2014-04-10 DIAGNOSIS — J449 Chronic obstructive pulmonary disease, unspecified: Secondary | ICD-10-CM | POA: Diagnosis not present

## 2014-04-10 DIAGNOSIS — Z794 Long term (current) use of insulin: Secondary | ICD-10-CM | POA: Insufficient documentation

## 2014-04-10 DIAGNOSIS — Z87891 Personal history of nicotine dependence: Secondary | ICD-10-CM | POA: Insufficient documentation

## 2014-04-10 DIAGNOSIS — Z95810 Presence of automatic (implantable) cardiac defibrillator: Secondary | ICD-10-CM | POA: Insufficient documentation

## 2014-04-10 DIAGNOSIS — K317 Polyp of stomach and duodenum: Secondary | ICD-10-CM | POA: Insufficient documentation

## 2014-04-10 DIAGNOSIS — E785 Hyperlipidemia, unspecified: Secondary | ICD-10-CM | POA: Diagnosis not present

## 2014-04-10 DIAGNOSIS — I129 Hypertensive chronic kidney disease with stage 1 through stage 4 chronic kidney disease, or unspecified chronic kidney disease: Secondary | ICD-10-CM | POA: Diagnosis not present

## 2014-04-10 DIAGNOSIS — R7989 Other specified abnormal findings of blood chemistry: Secondary | ICD-10-CM | POA: Insufficient documentation

## 2014-04-10 DIAGNOSIS — K2950 Unspecified chronic gastritis without bleeding: Secondary | ICD-10-CM | POA: Insufficient documentation

## 2014-04-10 DIAGNOSIS — D649 Anemia, unspecified: Secondary | ICD-10-CM | POA: Diagnosis present

## 2014-04-10 DIAGNOSIS — Z7982 Long term (current) use of aspirin: Secondary | ICD-10-CM | POA: Insufficient documentation

## 2014-04-10 DIAGNOSIS — K2100 Gastro-esophageal reflux disease with esophagitis, without bleeding: Secondary | ICD-10-CM | POA: Insufficient documentation

## 2014-04-10 DIAGNOSIS — N183 Chronic kidney disease, stage 3 (moderate): Secondary | ICD-10-CM | POA: Insufficient documentation

## 2014-04-10 DIAGNOSIS — Z1211 Encounter for screening for malignant neoplasm of colon: Secondary | ICD-10-CM | POA: Insufficient documentation

## 2014-04-10 DIAGNOSIS — E119 Type 2 diabetes mellitus without complications: Secondary | ICD-10-CM | POA: Diagnosis not present

## 2014-04-10 DIAGNOSIS — Z79899 Other long term (current) drug therapy: Secondary | ICD-10-CM | POA: Insufficient documentation

## 2014-04-10 HISTORY — PX: ESOPHAGOGASTRODUODENOSCOPY (EGD) WITH PROPOFOL: SHX5813

## 2014-04-10 HISTORY — PX: POLYPECTOMY: SHX5525

## 2014-04-10 HISTORY — PX: BIOPSY: SHX5522

## 2014-04-10 HISTORY — PX: COLONOSCOPY WITH PROPOFOL: SHX5780

## 2014-04-10 LAB — GLUCOSE, CAPILLARY
GLUCOSE-CAPILLARY: 74 mg/dL (ref 70–99)
Glucose-Capillary: 95 mg/dL (ref 70–99)

## 2014-04-10 SURGERY — COLONOSCOPY WITH PROPOFOL
Anesthesia: Monitor Anesthesia Care

## 2014-04-10 MED ORDER — LACTATED RINGERS IV SOLN
INTRAVENOUS | Status: DC | PRN
  Administered 2014-04-10: 07:00:00 via INTRAVENOUS

## 2014-04-10 MED ORDER — LIDOCAINE HCL (CARDIAC) 10 MG/ML IV SOLN
INTRAVENOUS | Status: DC | PRN
  Administered 2014-04-10: 25 mg via INTRAVENOUS

## 2014-04-10 MED ORDER — SUCCINYLCHOLINE CHLORIDE 20 MG/ML IJ SOLN
INTRAMUSCULAR | Status: AC
  Filled 2014-04-10: qty 1

## 2014-04-10 MED ORDER — GLYCOPYRROLATE 0.2 MG/ML IJ SOLN
INTRAMUSCULAR | Status: AC
  Filled 2014-04-10: qty 1

## 2014-04-10 MED ORDER — LIDOCAINE VISCOUS 2 % MT SOLN
OROMUCOSAL | Status: AC
  Filled 2014-04-10: qty 15

## 2014-04-10 MED ORDER — FENTANYL CITRATE 0.05 MG/ML IJ SOLN
25.0000 ug | INTRAMUSCULAR | Status: AC
  Administered 2014-04-10 (×2): 25 ug via INTRAVENOUS

## 2014-04-10 MED ORDER — ONDANSETRON HCL 4 MG/2ML IJ SOLN
4.0000 mg | Freq: Once | INTRAMUSCULAR | Status: AC
  Administered 2014-04-10: 4 mg via INTRAVENOUS

## 2014-04-10 MED ORDER — GLYCOPYRROLATE 0.2 MG/ML IJ SOLN
0.2000 mg | Freq: Once | INTRAMUSCULAR | Status: AC
  Administered 2014-04-10: 0.2 mg via INTRAVENOUS

## 2014-04-10 MED ORDER — FENTANYL CITRATE 0.05 MG/ML IJ SOLN
INTRAMUSCULAR | Status: AC
  Filled 2014-04-10: qty 2

## 2014-04-10 MED ORDER — EPHEDRINE SULFATE 50 MG/ML IJ SOLN
INTRAMUSCULAR | Status: DC | PRN
  Administered 2014-04-10 (×3): 5 mg via INTRAVENOUS

## 2014-04-10 MED ORDER — PROPOFOL 10 MG/ML IV EMUL
INTRAVENOUS | Status: AC
  Filled 2014-04-10: qty 20

## 2014-04-10 MED ORDER — PROPOFOL INFUSION 10 MG/ML OPTIME
INTRAVENOUS | Status: DC | PRN
  Administered 2014-04-10: 25 ug/kg/min via INTRAVENOUS

## 2014-04-10 MED ORDER — LIDOCAINE VISCOUS 2 % MT SOLN
3.0000 mL | Freq: Once | OROMUCOSAL | Status: AC
  Administered 2014-04-10: 3 mL via OROMUCOSAL
  Filled 2014-04-10: qty 5

## 2014-04-10 MED ORDER — LACTATED RINGERS IV SOLN
INTRAVENOUS | Status: DC
  Administered 2014-04-10: 08:00:00 via INTRAVENOUS

## 2014-04-10 MED ORDER — ONDANSETRON HCL 4 MG/2ML IJ SOLN: 4.0000 mg | Freq: Once | INTRAMUSCULAR | Status: DC | PRN

## 2014-04-10 MED ORDER — PHENYLEPHRINE HCL 10 MG/ML IJ SOLN
INTRAMUSCULAR | Status: AC
  Filled 2014-04-10: qty 1

## 2014-04-10 MED ORDER — SODIUM CHLORIDE 0.9 % IJ SOLN
INTRAMUSCULAR | Status: AC
  Filled 2014-04-10: qty 30

## 2014-04-10 MED ORDER — ONDANSETRON HCL 4 MG/2ML IJ SOLN
INTRAMUSCULAR | Status: AC
  Filled 2014-04-10: qty 2

## 2014-04-10 MED ORDER — MIDAZOLAM HCL 2 MG/2ML IJ SOLN
1.0000 mg | INTRAMUSCULAR | Status: DC | PRN
  Administered 2014-04-10: 2 mg via INTRAVENOUS

## 2014-04-10 MED ORDER — MIDAZOLAM HCL 2 MG/2ML IJ SOLN
INTRAMUSCULAR | Status: AC
  Filled 2014-04-10: qty 2

## 2014-04-10 MED ORDER — STERILE WATER FOR IRRIGATION IR SOLN
Status: DC | PRN
  Administered 2014-04-10: 1005 mL

## 2014-04-10 MED ORDER — FENTANYL CITRATE 0.05 MG/ML IJ SOLN
INTRAMUSCULAR | Status: DC | PRN
  Administered 2014-04-10: 25 ug via INTRAVENOUS
  Administered 2014-04-10 (×2): 12.5 ug via INTRAVENOUS

## 2014-04-10 MED ORDER — LIDOCAINE HCL (PF) 1 % IJ SOLN
INTRAMUSCULAR | Status: AC
  Filled 2014-04-10: qty 5

## 2014-04-10 MED ORDER — EPHEDRINE SULFATE 50 MG/ML IJ SOLN
INTRAMUSCULAR | Status: AC
  Filled 2014-04-10: qty 1

## 2014-04-10 MED ORDER — FENTANYL CITRATE 0.05 MG/ML IJ SOLN
25.0000 ug | INTRAMUSCULAR | Status: DC | PRN
Start: 2014-04-10 — End: 2014-04-10

## 2014-04-10 SURGICAL SUPPLY — 27 items
BLOCK BITE 60FR ADLT L/F BLUE (MISCELLANEOUS) ×2 IMPLANT
DEVICE CLIP HEMOSTAT 235CM (CLIP) IMPLANT
ELECT REM PT RETURN 9FT ADLT (ELECTROSURGICAL)
ELECTRODE REM PT RTRN 9FT ADLT (ELECTROSURGICAL) IMPLANT
FCP BXJMBJMB 240X2.8X (CUTTING FORCEPS)
FLOOR PAD 36X40 (MISCELLANEOUS) ×2
FORCEPS BIOP RAD 4 LRG CAP 4 (CUTTING FORCEPS) ×2 IMPLANT
FORCEPS BIOP RJ4 240 W/NDL (CUTTING FORCEPS)
FORCEPS BXJMBJMB 240X2.8X (CUTTING FORCEPS) IMPLANT
FORMALIN 10 PREFIL 20ML (MISCELLANEOUS) IMPLANT
INJECTOR/SNARE I SNARE (MISCELLANEOUS) IMPLANT
KIT CLEAN ENDO COMPLIANCE (KITS) ×2 IMPLANT
LUBRICANT JELLY 4.5OZ STERILE (MISCELLANEOUS) ×2 IMPLANT
MANIFOLD NEPTUNE II (INSTRUMENTS) ×2 IMPLANT
NEEDLE SCLEROTHERAPY 25GX240 (NEEDLE) IMPLANT
PAD FLOOR 36X40 (MISCELLANEOUS) ×1 IMPLANT
PROBE APC STR FIRE (PROBE) IMPLANT
PROBE INJECTION GOLD (MISCELLANEOUS)
PROBE INJECTION GOLD 7FR (MISCELLANEOUS) IMPLANT
ROTH PLATINUM NET UNIVERSAL (MISCELLANEOUS) ×2 IMPLANT
SNARE ROTATE MED OVAL 20MM (MISCELLANEOUS) ×2 IMPLANT
SNARE SHORT THROW 13M SML OVAL (MISCELLANEOUS) IMPLANT
SYR 50ML LL SCALE MARK (SYRINGE) ×2 IMPLANT
SYR INFLATION 60ML (SYRINGE) IMPLANT
TRAP SPECIMEN MUCOUS 40CC (MISCELLANEOUS) ×2 IMPLANT
TUBING IRRIGATION ENDOGATOR (MISCELLANEOUS) ×2 IMPLANT
WATER STERILE IRR 1000ML POUR (IV SOLUTION) ×2 IMPLANT

## 2014-04-10 NOTE — Op Note (Signed)
[facility] [address] [place], [address]   ENDOSCOPY PROCEDURE REPORT  PATIENT: Keith Young, Keith Young  MR#: [id number] BIRTHDATE: [birth year] , 46  yrs. old GENDER: male ENDOSCOPIST: R.  Garfield Cornea, MD FACP FACG REFERRED BY:  Fran Lowes, M.D. PROCEDURE DATE:  04/17/2014 PROCEDURE:  EGD w/ snare polypectomy and EGD w/ biopsy INDICATIONS: MEDICATIONS: deep sedation per Dr.  Patsey Berthold [facility] ASA CLASS:      Class III  CONSENT: The risks, benefits, limitations, alternatives and imponderables have been discussed.  The potential for biopsy, esophogeal dilation, etc. have also been reviewed.  Questions have been answered.  All parties agreeable.  Please see the history and physical in the medical record for more information.  DESCRIPTION OF PROCEDURE: After the risks benefits and alternatives of the procedure were thoroughly explained, informed consent was obtained.  The    endoscope was introduced through the mouth and advanced to the second portion of the duodenum , limited by Without limitations. The instrument was slowly withdrawn as the mucosa was fully examined.    Distal esophageal erosions within 5 mm of the GE junction.  No Barrett's esophagus.  Stomach empty.  Diffuse gastric erosions with patchy erythema.  Small Hiatal hernia.  Multiple hyperplastic appearing multilobulated antral polyps with some superficial ulceration and one with a small clot.  Please see multiple photographs.  These appear to be benign.  Patent pylorus.  Normal first and second portion of the duodenum.  Retroflexed views revealed as previously described.     A 6 mm pedunculated antral polyp was  removed cleanly with hot snare cautery. This was done with the defibrillator remaining active. There was no apparent complication with this maneuver. Subsequently, biopsies the abnormal gastric mucosa were taken for histologic study.The scope was then withdrawn from the patient  and the procedure completed.  COMPLICATIONS: There were no immediate complications.  ENDOSCOPIC IMPRESSION: Mild erosive reflux esophagitis. Multiple antral polyps?"likely hyperplastic status post removal by hot snare cautery technique. Diffusely abnormal stomach?"status post gastric biopsy  I suspect some of patient's anemia may be due to intermittent oozing from the stomach. It would be difficult and a risky proposition to attempt complete removal of all of his gastric polyps.  RECOMMENDATIONS: Follow-up on pathology. See colonoscopy report.  REPEAT EXAM:  eSigned:  R. Garfield Cornea, MD Rosalita Chessman [facility] 04-17-2014 9:25 AM    CC:  CPT CODES: ICD CODES:  The ICD and CPT codes recommended by this software are interpretations from the data that the clinical staff has captured with the software.  The verification of the translation of this report to the ICD and CPT codes and modifiers is the sole responsibility of the health care institution and practicing physician where this report was generated.  [employer] will not be held responsible for the validity of the ICD and CPT codes included on this report.  AMA assumes no liability for data contained or not contained herein. CPT is a Designer, television/film set of the [employer].  PATIENT NAME:  Keith Young, Keith Young MR#: [id number]

## 2014-04-10 NOTE — Interval H&P Note (Signed)
History and Physical Interval Note:  04/10/2014 8:04 AM  Silverio Lay  has presented today for surgery, with the diagnosis of anemia/elevated LFT'S/screening  The various methods of treatment have been discussed with the patient and family. After consideration of risks, benefits and other options for treatment, the patient has consented to  Procedure(s) with comments: COLONOSCOPY WITH PROPOFOL (N/A) - 845 ESOPHAGOGASTRODUODENOSCOPY (EGD) WITH PROPOFOL (N/A) as a surgical intervention .  The patient's history has been reviewed, patient examined, no change in status, stable for surgery.  I have reviewed the patient's chart and labs.  Questions were answered to the patient's satisfaction.     Drago Add Dinapoli  No change. Colonoscopy with possible upper endoscopy to follow. Per Dr. Patsey Berthold recommendations, he recommends not to inactivate defibrillator regardless regardless of modalities employed for procedures. This was discussed with the patient. We will not inactivate the defibrillator for the procedure.  The risks, benefits, limitations, imponderables and alternatives regarding both EGD and colonoscopy have been reviewed with the patient. Questions have been answered. All parties agreeable.

## 2014-04-10 NOTE — H&P (View-Only) (Signed)
Primary Care Physician:  [name], MD Primary Gastroenterologist:  Dr. Gala Romney   Chief Complaint  Patient presents with  . Colonoscopy    HPI:   Keith Young presents today at the request of Dr. Lowanda Foster for screening colonoscopy and abnormal labs. Appears he has a history of anemia and elevated LFTs.   No prior colonoscopy. No rectal bleeding. No melena. No abdominal pain. No constipation, diarrhea. No loss of appetite or weight loss. No dysphagia. No reflux symptoms. Poor historian.      Past Medical History  Diagnosis Date  . Chronic systolic heart failure     NYHA Class III  . Type 2 diabetes mellitus   . Nonischemic cardiomyopathy     EF 15-20%  . Essential hypertension, benign   . CKD (chronic kidney disease) stage 3, GFR 30-59 ml/min   . Anemia   . Hyperlipidemia   . History of stroke   . Bell's palsy   . Cardiomyopathy   . Aortic insufficiency   . Alcoholism in remission   . Noncompliance with medication regimen   . NSVT (nonsustained ventricular tachycardia)   . Cardiac defibrillator in situ   . COPD (chronic obstructive pulmonary disease)   . CVA (cerebral infarction) 01/2013  . Productive cough 08/2013    With brown sputum   . SOB (shortness of breath) on exertion 08/2013  . Numbness of right jaw     Had a stoke in 01/2013. Numbness is occasional, especially when trying to chew.  . At moderate risk for fall     Past Surgical History  Procedure Laterality Date  . Cardiac defibrillator placement  11/12/12    [employer] Inogen MINI ICD implanted in [place] at [facility]  . Cardiac surgery      bioprostetic AVR per Dr Nelly Laurence note    Current Outpatient Prescriptions  Medication Sig Dispense Refill  . aspirin 81 MG EC tablet Take 1 tablet (81 mg total) by mouth daily.      Marland Kitchen atorvastatin (LIPITOR) 80 MG tablet Take 80 mg by mouth daily.      . carvedilol (COREG) 25 MG tablet Take 25 mg by mouth 2 (two) times daily with a  meal.       . digoxin (LANOXIN) 0.125 MG tablet Take 0.5 tablets (0.0625 mg total) by mouth daily.  15 tablet  6  . ferrous sulfate 325 (65 FE) MG tablet Take 325 mg by mouth daily with breakfast.      . furosemide (LASIX) 80 MG tablet Take 40 mg by mouth 2 (two) times daily.       . insulin lispro (HUMALOG) 100 UNIT/ML injection Inject 2-6 Units into the skin 3 (three) times daily with meals.      . isosorbide-hydrALAZINE (BIDIL) 20-37.5 MG per tablet Take 2 tablets by mouth 3 (three) times daily.  180 tablet  3  . losartan (COZAAR) 25 MG tablet Take 1 tablet (25 mg total) by mouth daily.  30 tablet  6  . spironolactone (ALDACTONE) 25 MG tablet Take 0.5 tablets (12.5 mg total) by mouth daily.  15 tablet  3  . polyethylene glycol-electrolytes (TRILYTE) 420 G solution Take 4,000 mLs by mouth as directed.  4000 mL  0   No current facility-administered medications for this visit.    Allergies as of 03/19/2014  . (No Known Allergies)    Family History  Problem Relation Age of Onset  . Kidney disease Mother   . Kidney  disease Sister   . Colon cancer Neg Hx     History   Social History  . Marital Status: Single    Spouse Name: N/A    Number of Children: N/A  . Years of Education: N/A   Occupational History  . Not on file.   Social History Main Topics  . Smoking status: Former Smoker    Types: Cigarettes    Quit date: 11/05/1993  . Smokeless tobacco: Never Used  . Alcohol Use: Yes     Comment: former heavy ETOH, none recently  . Drug Use: No     Comment: prior history of cocaine use, smoking   . Sexual Activity: Not on file   Other Topics Concern  . Not on file   Social History Narrative   Recently moved from [place] to be with family in [place]                Review of Systems: As mentioned in HPI  Physical Exam: BP 135/75  Pulse 68  Temp(Src) 97 F (36.1 C) (Oral)  Ht 5\' 2"  (1.575 m)  Wt 137 lb (62.143 kg)  BMI 25.05 kg/m2 General:   Alert and  oriented. Pleasant and cooperative. Well-nourished and well-developed.  Head:  Normocephalic and atraumatic. Eyes:  Without icterus, sclera clear and conjunctiva pink.  Ears:  HOH right ear, hearing aide Nose:  No deformity, discharge,  or lesions. Mouth:  Poor dentition Lungs:  Clear to auscultation bilaterally. No wheezes, rales, or rhonchi. No distress.  Heart:  S1 S2 present, 2/6 systolic murmur Abdomen:  +BS, soft, non-tender and non-distended. No HSM noted. No guarding or rebound. No masses appreciated.  Rectal:  Deferred  Msk:  Symmetrical without gross deformities. Normal posture. Extremities:  Without edema. Neurologic:  Alert and  oriented x4;  grossly normal neurologically. Skin:  Intact without significant lesions or rashes. Psych:  Alert and cooperative. Normal mood and affect.  Sept 2015. Ferritin 160, TIBC low at 222. Iron 58. Hgb 9.1  Cr 1.71.   May 2015:  AST 49, ALT 46, AP 265, Tbili 1.0, Direct bilirubin 0.5, indirect 0.5.

## 2014-04-10 NOTE — Op Note (Signed)
[facility] [address] [place], [address]   COLONOSCOPY PROCEDURE REPORT  PATIENT: Keith Young, Keith Young  MR#: [id number] BIRTHDATE: 12/09/1950 , 52  yrs. old GENDER: male ENDOSCOPIST: R.  Garfield Cornea, MD FACP [facility] REFERRED [id number] Lowanda Foster, M.D. PROCEDURE DATE:  04/14/14 PROCEDURE:   Colonoscopy, screening INDICATIONS:Anemia; no prior screening examination. MEDICATIONS: deep sedation per Dr.  Patsey Berthold [facility] ASA CLASS:       Class III  CONSENT: The risks, benefits, alternatives and imponderables including but not limited to bleeding, perforation as well as the possibility of a missed lesion have been reviewed.  The potential for biopsy, lesion removal, etc. have also been discussed. Questions have been answered.  All parties agreeable.  Please see the history and physical in the medical record for more information.  DESCRIPTION OF PROCEDURE:   After the risks benefits and alternatives of the procedure were thoroughly explained, informed consent was obtained.  The digital rectal exam revealed no abnormalities of the rectum.   The     endoscope was introduced through the anus and advanced to the terminal ileum which was intubated for a short distance. No adverse events experienced. The quality of the prep was adequate.  The instrument was then slowly withdrawn as the colon was fully examined.      COLON FINDINGS: Normal rectum.  Scattered left-sided diverticula; the remainder of the colonic mucosa appeared normal.  The distal 10 cm of terminal ileal mucosa also appeared normal.     .  Withdrawal time=7 minutes 0 seconds.  The scope was withdrawn and the procedure completed. COMPLICATIONS: There were no immediate complications.  ENDOSCOPIC IMPRESSION: Colonic diverticulosis.  RECOMMENDATIONS: See EGD report  eSigned:  R. Garfield Cornea, MD Rosalita Chessman [facility] 14-Apr-2014 9:28 AM   cc:  CPT CODES: ICD CODES:  The ICD and CPT codes recommended by  this software are interpretations from the data that the clinical staff has captured with the software.  The verification of the translation of this report to the ICD and CPT codes and modifiers is the sole responsibility of the health care institution and practicing physician where this report was generated.  [employer] will not be held responsible for the validity of the ICD and CPT codes included on this report.  AMA assumes no liability for data contained or not contained herein. CPT is a Designer, television/film set of the [employer].  PATIENT NAME:  Keith Young, Keith Young MR#: [id number]

## 2014-04-10 NOTE — Anesthesia Procedure Notes (Signed)
Procedure Name: MAC Date/Time: 04/10/2014 8:13 AM Performed by: Andree Elk, Niveah Boerner A Pre-anesthesia Checklist: Patient identified, Timeout performed, Emergency Drugs available, Suction available and Patient being monitored Oxygen Delivery Method: Simple face mask

## 2014-04-10 NOTE — Anesthesia Preprocedure Evaluation (Signed)
Anesthesia Evaluation  Patient identified by MRN, date of birth, ID band Patient awake    Reviewed: Allergy & Precautions, H&P , NPO status , Patient's Chart, lab work & pertinent test results, reviewed documented beta blocker date and time   Airway Mallampati: II  TM Distance: >3 FB     Dental  (+) Poor Dentition, Chipped, Missing, Dental Advisory Given   Pulmonary shortness of breath and with exertion, COPDformer smoker,  breath sounds clear to auscultation        Cardiovascular hypertension, Pt. on medications and Pt. on home beta blockers + CAD and +CHF + Cardiac Defibrillator Rhythm:Regular Rate:Normal     Neuro/Psych CVA, Residual Symptoms    GI/Hepatic (+)     substance abuse  alcohol use,   Endo/Other  diabetes, Poorly Controlled, Type 2, Oral Hypoglycemic Agents  Renal/GU Renal InsufficiencyRenal disease     Musculoskeletal   Abdominal   Peds  Hematology  (+) anemia ,   Anesthesia Other Findings   Reproductive/Obstetrics                             Anesthesia Physical Anesthesia Plan  ASA: IV  Anesthesia Plan: MAC   Post-op Pain Management:    Induction: Intravenous  Airway Management Planned: Simple Face Mask  Additional Equipment:   Intra-op Plan:   Post-operative Plan:   Informed Consent: I have reviewed the patients History and Physical, chart, labs and discussed the procedure including the risks, benefits and alternatives for the proposed anesthesia with the patient or authorized representative who has indicated his/her understanding and acceptance.     Plan Discussed with:   Anesthesia Plan Comments:         Anesthesia Quick Evaluation

## 2014-04-10 NOTE — Transfer of Care (Signed)
Immediate Anesthesia Transfer of Care Note  Patient: Keith Young  Procedure(s) Performed: Procedure(s) with comments: COLONOSCOPY WITH PROPOFOL (N/A) - cecum time in  0845     time out    0851       total time 6 minutes ESOPHAGOGASTRODUODENOSCOPY (EGD) WITH PROPOFOL (N/A) GASTRIC POLYPECTOMY (N/A) GASTRIC BIOPSY (N/A)  Patient Location: PACU  Anesthesia Type:MAC  Level of Consciousness: awake, alert , oriented and patient cooperative  Airway & Oxygen Therapy: Patient Spontanous Breathing  Post-op Assessment: Report given to PACU RN and Post -op Vital signs reviewed and stable  Post vital signs: Reviewed and stable  Complications: No apparent anesthesia complications

## 2014-04-10 NOTE — Anesthesia Postprocedure Evaluation (Signed)
  Anesthesia Post-op Note Late entry: Patient: Keith Young  Procedure(s) Performed: Procedure(s) with comments: COLONOSCOPY WITH PROPOFOL (N/A) - cecum time in  0845     time out    0851       total time 6 minutes ESOPHAGOGASTRODUODENOSCOPY (EGD) WITH PROPOFOL (N/A) GASTRIC POLYPECTOMY (N/A) GASTRIC BIOPSY (N/A)  Patient Location: PACU  Anesthesia Type:MAC  Level of Consciousness: awake, alert , oriented and patient cooperative  Airway and Oxygen Therapy: Patient Spontanous Breathing  Post-op Pain: none  Post-op Assessment: Post-op Vital signs reviewed, Patient's Cardiovascular Status Stable, Respiratory Function Stable, Patent Airway and No signs of Nausea or vomiting  Post-op Vital Signs: Reviewed and stable  Last Vitals:  Filed Vitals:   04/10/14 1001  BP: 108/65  Pulse: 65  Temp: 36.6 C  Resp: 16    Complications: No apparent anesthesia complications;

## 2014-04-10 NOTE — Discharge Instructions (Signed)
Colonoscopy Discharge Instructions  Read the instructions outlined below and refer to this sheet in the next few weeks. These discharge instructions provide you with general information on caring for yourself after you leave the hospital. Your doctor may also give you specific instructions. While your treatment has been planned according to the most current medical practices available, unavoidable complications occasionally occur. If you have any problems or questions after discharge, call Dr. Gala Romney at [phone]. ACTIVITY  You may resume your regular activity, but move at a slower pace for the next 24 hours.   Take frequent rest periods for the next 24 hours.   Walking will help get rid of the air and reduce the bloated feeling in your belly (abdomen).   No driving for 24 hours (because of the medicine (anesthesia) used during the test).    Do not sign any important legal documents or operate any machinery for 24 hours (because of the anesthesia used during the test).  NUTRITION  Drink plenty of fluids.   You may resume your normal diet as instructed by your doctor.   Begin with a light meal and progress to your normal diet. Heavy or fried foods are harder to digest and may make you feel sick to your stomach (nauseated).   Avoid alcoholic beverages for 24 hours or as instructed.  MEDICATIONS  You may resume your normal medications unless your doctor tells you otherwise.  WHAT YOU CAN EXPECT TODAY  Some feelings of bloating in the abdomen.   Passage of more gas than usual.   Spotting of blood in your stool or on the toilet paper.  IF YOU HAD POLYPS REMOVED DURING THE COLONOSCOPY:  No aspirin products for 7 days or as instructed.   No alcohol for 7 days or as instructed.   Eat a soft diet for the next 24 hours.  FINDING OUT THE RESULTS OF YOUR TEST Not all test results are available during your visit. If your test results are not back during the visit, make an appointment  with your caregiver to find out the results. Do not assume everything is normal if you have not heard from your caregiver or the medical facility. It is important for you to follow up on all of your test results.  SEEK IMMEDIATE MEDICAL ATTENTION IF:  You have more than a spotting of blood in your stool.   Your belly is swollen (abdominal distention).   You are nauseated or vomiting.   You have a temperature over 101.  You have abdominal pain or discomfort that is severe or gets worse throughout the day. EGD Discharge instructions Please read the instructions outlined below and refer to this sheet in the next few weeks. These discharge instructions provide you with general information on caring for yourself after you leave the hospital. Your doctor may also give you specific instructions. While your treatment has been planned according to the most current medical practices available, unavoidable complications occasionally occur. If you have any problems or questions after discharge, please call your doctor. ACTIVITY You may resume your regular activity but move at a slower pace for the next 24 hours.  Take frequent rest periods for the next 24 hours.  Walking will help expel (get rid of) the air and reduce the bloated feeling in your abdomen.  No driving for 24 hours (because of the anesthesia (medicine) used during the test).  You may shower.  Do not sign any important legal documents or operate any machinery for 24  hours (because of the anesthesia used during the test).  NUTRITION Drink plenty of fluids.  You may resume your normal diet.  Begin with a light meal and progress to your normal diet.  Avoid alcoholic beverages for 24 hours or as instructed by your caregiver.  MEDICATIONS You may resume your normal medications unless your caregiver tells you otherwise.  WHAT YOU CAN EXPECT TODAY You may experience abdominal discomfort such as a feeling of fullness or gas pains.   FOLLOW-UP Your doctor will discuss the results of your test with you.  SEEK IMMEDIATE MEDICAL ATTENTION IF ANY OF THE FOLLOWING OCCUR: Excessive nausea (feeling sick to your stomach) and/or vomiting.  Severe abdominal pain and distention (swelling).  Trouble swallowing.  Temperature over 101 F (37.8 C).  Rectal bleeding or vomiting of blood.     GERD and diverticulosis information provided  Further recommendations to follow pending review of pathology report  Diverticulosis Diverticulosis is the condition that develops when small pouches (diverticula) form in the wall of your colon. Your colon, or large intestine, is where water is absorbed and stool is formed. The pouches form when the inside layer of your colon pushes through weak spots in the outer layers of your colon. CAUSES  No one knows exactly what causes diverticulosis. RISK FACTORS Being older than 71. Your risk for this condition increases with age. Diverticulosis is rare in people younger than 40 years. By age 79, almost everyone has it. Eating a low-fiber diet. Being frequently constipated. Being overweight. Not getting enough exercise. Smoking. Taking over-the-counter pain medicines, like aspirin and ibuprofen. SYMPTOMS  Most people with diverticulosis do not have symptoms. DIAGNOSIS  Because diverticulosis often has no symptoms, health care providers often discover the condition during an exam for other colon problems. In many cases, a health care provider will diagnose diverticulosis while using a flexible scope to examine the colon (colonoscopy). TREATMENT  If you have never developed an infection related to diverticulosis, you may not need treatment. If you have had an infection before, treatment may include: Eating more fruits, vegetables, and grains. Taking a fiber supplement. Taking a live bacteria supplement (probiotic). Taking medicine to relax your colon. HOME CARE INSTRUCTIONS  Drink at least 6-8  glasses of water each day to prevent constipation. Try not to strain when you have a bowel movement. Keep all follow-up appointments. If you have had an infection before: Increase the fiber in your diet as directed by your health care provider or dietitian. Take a dietary fiber supplement if your health care provider approves. Only take medicines as directed by your health care provider. SEEK MEDICAL CARE IF:  You have abdominal pain. You have bloating. You have cramps. You have not gone to the bathroom in 3 days. SEEK IMMEDIATE MEDICAL CARE IF:  Your pain gets worse. Yourbloating becomes very bad. You have a fever or chills, and your symptoms suddenly get worse. You begin vomiting. You have bowel movements that are bloody or black. MAKE SURE YOU: Understand these instructions. Will watch your condition. Will get help right away if you are not doing well or get worse. Document Released: 02/11/2004 Document Revised: 05/21/2013 Document Reviewed: 04/10/2013 [facility] Patient Information 2015 [place], [place]. This information is not intended to replace advice given to you by your health care provider. Make sure you discuss any questions you have with your health care provider. Gastroesophageal Reflux Disease, Adult Gastroesophageal reflux disease (GERD) happens when acid from your stomach flows up into the esophagus. When acid  comes in contact with the esophagus, the acid causes soreness (inflammation) in the esophagus. Over time, GERD may create small holes (ulcers) in the lining of the esophagus. CAUSES  Increased body weight. This puts pressure on the stomach, making acid rise from the stomach into the esophagus. Smoking. This increases acid production in the stomach. Drinking alcohol. This causes decreased pressure in the lower esophageal sphincter (valve or ring of muscle between the esophagus and stomach), allowing acid from the stomach into the esophagus. Late evening meals and a  full stomach. This increases pressure and acid production in the stomach. A malformed lower esophageal sphincter. Sometimes, no cause is found. SYMPTOMS  Burning pain in the lower part of the mid-chest behind the breastbone and in the mid-stomach area. This may occur twice a week or more often. Trouble swallowing. Sore throat. Dry cough. Asthma-like symptoms including chest tightness, shortness of breath, or wheezing. DIAGNOSIS  Your caregiver may be able to diagnose GERD based on your symptoms. In some cases, X-rays and other tests may be done to check for complications or to check the condition of your stomach and esophagus. TREATMENT  Your caregiver may recommend over-the-counter or prescription medicines to help decrease acid production. Ask your caregiver before starting or adding any new medicines.  HOME CARE INSTRUCTIONS  Change the factors that you can control. Ask your caregiver for guidance concerning weight loss, quitting smoking, and alcohol consumption. Avoid foods and drinks that make your symptoms worse, such as: Caffeine or alcoholic drinks. Chocolate. Peppermint or mint flavorings. Garlic and onions. Spicy foods. Citrus fruits, such as oranges, lemons, or limes. Tomato-based foods such as sauce, chili, salsa, and pizza. Fried and fatty foods. Avoid lying down for the 3 hours prior to your bedtime or prior to taking a nap. Eat small, frequent meals instead of large meals. Wear loose-fitting clothing. Do not wear anything tight around your waist that causes pressure on your stomach. Raise the head of your bed 6 to 8 inches with wood blocks to help you sleep. Extra pillows will not help. Only take over-the-counter or prescription medicines for pain, discomfort, or fever as directed by your caregiver. Do not take aspirin, ibuprofen, or other nonsteroidal anti-inflammatory drugs (NSAIDs). SEEK IMMEDIATE MEDICAL CARE IF:  You have pain in your arms, neck, jaw, teeth, or  back. Your pain increases or changes in intensity or duration. You develop nausea, vomiting, or sweating (diaphoresis). You develop shortness of breath, or you faint. Your vomit is green, yellow, black, or looks like coffee grounds or blood. Your stool is red, bloody, or black. These symptoms could be signs of other problems, such as heart disease, gastric bleeding, or esophageal bleeding. MAKE SURE YOU:  Understand these instructions. Will watch your condition. Will get help right away if you are not doing well or get worse. Document Released: 02/23/2005 Document Revised: 08/08/2011 Document Reviewed: 12/03/2010 [facility] Patient Information 2015 [place], [place]. This information is not intended to replace advice given to you by your health care provider. Make sure you discuss any questions you have with your health care provider.

## 2014-04-11 ENCOUNTER — Encounter (HOSPITAL_COMMUNITY): Payer: Self-pay | Admitting: Internal Medicine

## 2014-04-17 ENCOUNTER — Encounter: Payer: Self-pay | Admitting: Cardiology

## 2014-04-22 ENCOUNTER — Encounter: Payer: Self-pay | Admitting: Internal Medicine

## 2014-04-26 ENCOUNTER — Encounter: Payer: Self-pay | Admitting: Internal Medicine

## 2014-04-29 ENCOUNTER — Encounter: Payer: Self-pay | Admitting: Internal Medicine

## 2014-04-29 ENCOUNTER — Telehealth: Payer: Self-pay

## 2014-04-29 ENCOUNTER — Telehealth: Payer: Self-pay | Admitting: Internal Medicine

## 2014-04-29 DIAGNOSIS — D631 Anemia in chronic kidney disease: Secondary | ICD-10-CM

## 2014-04-29 DIAGNOSIS — N189 Chronic kidney disease, unspecified: Principal | ICD-10-CM

## 2014-04-29 NOTE — Telephone Encounter (Signed)
APPT MADE AND LETTER SENT  °

## 2014-04-29 NOTE — Telephone Encounter (Signed)
Faxed to pcp °

## 2014-04-29 NOTE — Telephone Encounter (Signed)
New Message  [facility] representative called states a letter was received indicating the patient hasn't been seen in some time. [facility] requests a call back to determine if the signals are being received. Please call

## 2014-04-29 NOTE — Telephone Encounter (Signed)
Transmission received, patient aware. 

## 2014-04-29 NOTE — Telephone Encounter (Signed)
Letter mailed to pt Lab order on file

## 2014-04-29 NOTE — Telephone Encounter (Signed)
Send letter to patient.  Send copy of letter with path to referring provider and PCP.  Need ov w extender in about 8 weeks w cbc

## 2014-05-07 ENCOUNTER — Other Ambulatory Visit: Payer: Self-pay

## 2014-05-07 ENCOUNTER — Other Ambulatory Visit: Payer: Self-pay | Admitting: Gastroenterology

## 2014-05-07 DIAGNOSIS — R7989 Other specified abnormal findings of blood chemistry: Secondary | ICD-10-CM

## 2014-05-07 DIAGNOSIS — R945 Abnormal results of liver function studies: Principal | ICD-10-CM

## 2014-05-08 ENCOUNTER — Encounter (HOSPITAL_COMMUNITY): Payer: Self-pay | Admitting: Cardiology

## 2014-05-14 ENCOUNTER — Ambulatory Visit (HOSPITAL_COMMUNITY)
Admission: RE | Admit: 2014-05-14 | Discharge: 2014-05-14 | Disposition: A | Discharge status: Home / Self Care | Payer: Medicare HMO | Source: Ambulatory Visit | Attending: Gastroenterology | Admitting: Gastroenterology

## 2014-05-14 DIAGNOSIS — R161 Splenomegaly, not elsewhere classified: Secondary | ICD-10-CM | POA: Insufficient documentation

## 2014-05-14 DIAGNOSIS — R945 Abnormal results of liver function studies: Secondary | ICD-10-CM | POA: Diagnosis present

## 2014-05-14 DIAGNOSIS — K8020 Calculus of gallbladder without cholecystitis without obstruction: Secondary | ICD-10-CM | POA: Diagnosis not present

## 2014-05-14 DIAGNOSIS — N189 Chronic kidney disease, unspecified: Secondary | ICD-10-CM | POA: Insufficient documentation

## 2014-05-14 DIAGNOSIS — N281 Cyst of kidney, acquired: Secondary | ICD-10-CM | POA: Insufficient documentation

## 2014-05-14 DIAGNOSIS — R7989 Other specified abnormal findings of blood chemistry: Secondary | ICD-10-CM

## 2014-05-20 NOTE — Progress Notes (Signed)
Quick Note:  Liver without obvious abnormality. Mild splenomegaly. Will just recheck LFTs in 3 months. I believe this is already on file. ______

## 2014-05-27 ENCOUNTER — Other Ambulatory Visit: Payer: Self-pay

## 2014-05-27 DIAGNOSIS — R945 Abnormal results of liver function studies: Secondary | ICD-10-CM

## 2014-05-27 DIAGNOSIS — N189 Chronic kidney disease, unspecified: Principal | ICD-10-CM

## 2014-05-27 DIAGNOSIS — D631 Anemia in chronic kidney disease: Secondary | ICD-10-CM

## 2014-05-27 DIAGNOSIS — R7989 Other specified abnormal findings of blood chemistry: Secondary | ICD-10-CM

## 2014-06-05 ENCOUNTER — Other Ambulatory Visit: Payer: Self-pay | Admitting: Cardiology

## 2014-06-05 DIAGNOSIS — I5022 Chronic systolic (congestive) heart failure: Secondary | ICD-10-CM

## 2014-06-24 ENCOUNTER — Other Ambulatory Visit: Payer: Self-pay | Admitting: Gastroenterology

## 2014-06-24 LAB — CBC WITH DIFFERENTIAL/PLATELET
Basophils Absolute: 0 10*3/uL (ref 0.0–0.1)
Basophils Relative: 1 % (ref 0–1)
EOS ABS: 0.1 10*3/uL (ref 0.0–0.7)
Eosinophils Relative: 5 % (ref 0–5)
HEMATOCRIT: 30.7 % — AB (ref 39.0–52.0)
Hemoglobin: 10.1 g/dL — ABNORMAL LOW (ref 13.0–17.0)
LYMPHS ABS: 0.7 10*3/uL (ref 0.7–4.0)
Lymphocytes Relative: 28 % (ref 12–46)
MCH: 28.7 pg (ref 26.0–34.0)
MCHC: 32.9 g/dL (ref 30.0–36.0)
MCV: 87.2 fL (ref 78.0–100.0)
MPV: 9.6 fL (ref 8.6–12.4)
Monocytes Absolute: 0.4 10*3/uL (ref 0.1–1.0)
Monocytes Relative: 15 % — ABNORMAL HIGH (ref 3–12)
Neutro Abs: 1.3 10*3/uL — ABNORMAL LOW (ref 1.7–7.7)
Neutrophils Relative %: 51 % (ref 43–77)
PLATELETS: 157 10*3/uL (ref 150–400)
RBC: 3.52 MIL/uL — ABNORMAL LOW (ref 4.22–5.81)
RDW: 13 % (ref 11.5–15.5)
WBC: 2.6 10*3/uL — AB (ref 4.0–10.5)

## 2014-06-25 LAB — HEPATIC FUNCTION PANEL
ALK PHOS: 81 U/L (ref 39–117)
ALT: 31 U/L (ref 0–53)
AST: 30 U/L (ref 0–37)
Albumin: 3.3 g/dL — ABNORMAL LOW (ref 3.5–5.2)
BILIRUBIN DIRECT: 0.1 mg/dL (ref 0.0–0.3)
Indirect Bilirubin: 0.2 mg/dL (ref 0.2–1.2)
Total Bilirubin: 0.3 mg/dL (ref 0.2–1.2)
Total Protein: 7.8 g/dL (ref 6.0–8.3)

## 2014-06-26 LAB — HEPATITIS C ANTIBODY: HCV Ab: NEGATIVE

## 2014-06-30 ENCOUNTER — Ambulatory Visit (INDEPENDENT_AMBULATORY_CARE_PROVIDER_SITE_OTHER): Payer: Medicare HMO | Admitting: *Deleted

## 2014-06-30 DIAGNOSIS — I429 Cardiomyopathy, unspecified: Secondary | ICD-10-CM

## 2014-06-30 LAB — MDC_IDC_ENUM_SESS_TYPE_REMOTE
Battery Remaining Longevity: 78 mo
Brady Statistic RV Percent Paced: 0 %
HighPow Impedance: 55 Ohm
Implantable Pulse Generator Serial Number: 436636
Lead Channel Impedance Value: 391 Ohm
Lead Channel Setting Pacing Amplitude: 3 V
Lead Channel Setting Pacing Pulse Width: 1.2 ms
Lead Channel Setting Sensing Sensitivity: 0.6 mV
MDC IDC MSMT BATTERY REMAINING PERCENTAGE: 100 %
MDC IDC SESS DTM: 20160201140100
MDC IDC SET ZONE DETECTION INTERVAL: 300 ms

## 2014-06-30 NOTE — Progress Notes (Signed)
Remote ICD transmission.   

## 2014-07-01 ENCOUNTER — Ambulatory Visit: Payer: Medicare HMO | Admitting: Gastroenterology

## 2014-07-03 ENCOUNTER — Encounter: Payer: Self-pay | Admitting: Cardiology

## 2014-07-04 ENCOUNTER — Other Ambulatory Visit: Payer: Self-pay | Admitting: Cardiology

## 2014-07-07 ENCOUNTER — Encounter: Payer: Medicare HMO | Admitting: Cardiology

## 2014-07-07 ENCOUNTER — Encounter: Payer: Self-pay | Admitting: Internal Medicine

## 2014-07-07 NOTE — Progress Notes (Signed)
Clinical Summary [name] is a 64 y.o.male seen today for follow up of the following medical problems.   1. Chronic systolic heart failure/NICM  - most of his previous care had been at [facility] in [place].  - echo 10/24/13 LVEF [id number] II diastolic dysfunction, multiple WMAs. +spontaneous echo constrast suggestive of low cardiac output. Function appears fairly stable from prior echoes from [facility]  - cath at [place] (in epic media section) 10/2012 with patent coronaries.  - Appears medical management has been limited due to medication non-compliance and polysubstance abuse - he has a Chemical [employer] ICD placed at [facility]  - followed in [place] clinic, last visit 04/02/14  - reports DOE at 1 block which somewhat better than before. No LE edema, no orthopnea, no PND. No chest pain - limiting sodium intake, avoiding NSAIDs - weighs himself daily, typically 127 lbs and has been stable.   - ACE stopped during admission due to concern for cough. Last visit started losartan, K increased but within normal limites. Stable Cr around 1.6.   2. CKD  - baseline Cr around 1.5-1.8, stable on last check  3. Aortic stenosis  - history of bioprosthetic AVR.  - echo 09/2013 showed heavily calcified prosthesis, mean grad of 10 which is stable from prior echoes. Gradient may be underestimated due to low output. From my review dimensionless index of 0.30 more consistent with moderate AS.   4. Afib - old records mention history of afib, not clearly described.  - he has not been on any anticoagulation, he is not sure if he ever has been - recent device check without any noted afib episodes.   5. OSA Sleep study by Keith Young Jan 2015 with mild OSA, recs to consider testing with nasal CPAP, unclear if this was done. - reports he has follow up with Keith Young for his hx of CVA, he is to discuss there  6. CVA - history unclear, he is on ASA and  plavix for seconary prevetion. He is due to see neurology here in [facility]  Past Medical History  Diagnosis Date  . Chronic systolic heart failure     NYHA Class III  . Type 2 diabetes mellitus   . Nonischemic cardiomyopathy     EF 15-20%  . Essential hypertension, benign   . CKD (chronic kidney disease) stage 3, GFR 30-59 ml/min   . Anemia   . Hyperlipidemia   . History of stroke   . Bell's palsy   . Cardiomyopathy   . Aortic insufficiency   . Alcoholism in remission   . Noncompliance with medication regimen   . NSVT (nonsustained ventricular tachycardia)   . Cardiac defibrillator in situ   . COPD (chronic obstructive pulmonary disease)   . CVA (cerebral infarction) 01/2013  . Productive cough 08/2013    With brown sputum   . SOB (shortness of breath) on exertion 08/2013  . Numbness of right jaw     Had a stoke in 01/2013. Numbness is occasional, especially when trying to chew.  . At moderate risk for fall   . AICD (automatic cardioverter/defibrillator) present   . Stroke     weakness of right side from CVA  . Arthritis      No Known Allergies   Current Outpatient Prescriptions  Medication Sig Dispense Refill  . aspirin 81 MG EC tablet Take 1 tablet (81 mg total) by mouth daily.    Marland Kitchen atorvastatin (LIPITOR) 80 MG tablet  Take 80 mg by mouth daily.    . carvedilol (COREG) 25 MG tablet Take 12.5 mg by mouth 2 (two) times daily with a meal.     . digoxin (LANOXIN) 0.125 MG tablet Take 0.5 tablets (0.0625 mg total) by mouth daily. 15 tablet 6  . ferrous sulfate 325 (65 FE) MG tablet Take 325 mg by mouth daily with breakfast.    . furosemide (LASIX) 80 MG tablet Take 40 mg by mouth 2 (two) times daily.     . hydrALAZINE (APRESOLINE) 25 MG tablet Take 3 tablets by mouth 3 (three) times daily.    . insulin lispro (HUMALOG) 100 UNIT/ML injection Inject 2-6 Units into the skin 3 (three) times daily with meals.    . isosorbide dinitrate (ISORDIL) 20 MG tablet Take 2  tablets by mouth 3 (three) times daily.    Marland Kitchen losartan (COZAAR) 25 MG tablet TAKE 1 TABLET BY MOUTH ONCE A DAY. 30 tablet 0  . polyethylene glycol-electrolytes (TRILYTE) 420 G solution Take 4,000 mLs by mouth as directed. 4000 mL 0  . spironolactone (ALDACTONE) 25 MG tablet TAKE 1/2 TABLET (12.5MG ) BY MOUTH ONCE DAILY. 15 tablet 6   No current facility-administered medications for this visit.     Past Surgical History  Procedure Laterality Date  . Cardiac defibrillator placement  11/12/12    [employer] Inogen MINI ICD implanted in [place] at [facility]  . Cardiac surgery      bioprostetic AVR per Keith Keith Young note  . Colonoscopy with propofol N/A 04/10/2014    RMR: Colonic Diverticulosis  . Esophagogastroduodenoscopy (egd) with propofol N/A 04/10/2014    RMR: Mild erosive reflux esophagitis. Multiple antral polyps likely hyperplastic status post removal by hot snare cautery technique. Diffusely abnormal stomach status post gastric biopsy I suspect some of patients anemaia may be due to intermittent oozing from the stomach. It would be difficult and a risky  . Polypectomy N/A 04/10/2014    Procedure: GASTRIC POLYPECTOMY;  Surgeon: Keith Dolin, MD;  Location: [facility];  Service: Endoscopy;  Laterality: N/A;  . Esophageal biopsy N/A 04/10/2014    Procedure: GASTRIC BIOPSY;  Surgeon: Keith Dolin, MD;  Location: [facility];  Service: Endoscopy;  Laterality: N/A;  . Right heart catheterization N/A 02/06/2014    Procedure: RIGHT HEART CATH;  Surgeon: Keith Dresser, MD;  Location: [facility] CATH LAB;  Service: Cardiovascular;  Laterality: N/A;     No Known Allergies    Family History  Problem Relation Age of Onset  . Kidney disease Mother   . Kidney disease Sister   . Colon cancer Neg Hx      Social History Keith Young reports that he quit smoking about 20 years ago. His smoking use included Cigarettes. He has never used smokeless tobacco. Keith Young reports that he drinks  alcohol.   Review of Systems CONSTITUTIONAL: No weight loss, fever, chills, weakness or fatigue.  HEENT: Eyes: No visual loss, blurred vision, double vision or yellow sclerae.No hearing loss, sneezing, congestion, runny nose or sore throat.  SKIN: No rash or itching.  CARDIOVASCULAR:  RESPIRATORY: No shortness of breath, cough or sputum.  GASTROINTESTINAL: No anorexia, nausea, vomiting or diarrhea. No abdominal pain or blood.  GENITOURINARY: No burning on urination, no polyuria NEUROLOGICAL: No headache, dizziness, syncope, paralysis, ataxia, numbness or tingling in the extremities. No change in bowel or bladder control.  MUSCULOSKELETAL: No muscle, back pain, joint pain or stiffness.  LYMPHATICS: No enlarged nodes. No history of splenectomy.  PSYCHIATRIC: No history of depression or anxiety.  ENDOCRINOLOGIC: No reports of sweating, cold or heat intolerance. No polyuria or polydipsia.  Marland Kitchen   Physical Examination There were no vitals filed for this visit. There were no vitals filed for this visit.  Gen: resting comfortably, no acute distress HEENT: no scleral icterus, pupils equal round and reactive, no palptable cervical adenopathy,  CV Resp: Clear to auscultation bilaterally GI: abdomen is soft, non-tender, non-distended, normal bowel sounds, no hepatosplenomegaly MSK: extremities are warm, no edema.  Skin: warm, no rash Neuro:  no focal deficits Psych: appropriate affect   Diagnostic Studies 10/24/13 Echo  Study Conclusions  - Left ventricle: The cavity size was moderately dilated. Wall thickness was increased in a pattern of mild LVH. Systolic function was severely reduced. The estimated ejection fraction was in the range of 15% to 20%. Spontaneous echocontrast and low stroke volume consistent with low cardiac output. There is akinesis of the anteroseptal, anterior, mid to distal inferior, inferoseptal, and apical myocardium. Features are consistent with a pseudonormal  left ventricular filling pattern, with concomitant abnormal relaxation and increased filling pressure (grade 2 diastolic dysfunction). Doppler parameters are consistent with elevated ventricular end-diastolic filling pressure. - Ventricular septum: Septal motion showed abnormal function and dyssynergy. - Aortic valve: Appears heavily calcified with poorly defined leaflet structure - cannot completely exclude stented bioprosthesis, but history at this point does not confirm this. There was possible moderate to severe stenosis, low gradient. There was no significant regurgitation. Mean gradient (S): 10 mm Hg. Peak gradient (S): 18 mm Hg. Valve area (VTI): 1.09 cm^2. Valve area (Vmax): 1 cm^2. LVOT/AV VTI ratio 0.31. - Mitral valve: Mildly thickened leaflets . There was mild regurgitation. - Left atrium: The atrium was moderately to severely dilated. - Right ventricle: The cavity size was moderately dilated. Pacer wire or catheter noted in right ventricle. Systolic function was moderately to severely reduced. - Right atrium: The atrium was mildly to moderately dilated. Central venous pressure (est): 15 mm Hg. - Tricuspid valve: There was moderate regurgitation. - Pulmonic valve: There was mild regurgitation. - Pulmonary arteries: PA peak pressure: 52 mm Hg (S). - Pericardium, extracardiac: There was no pericardial effusion.  Impressions:  - Moderate LV dilatation with mild LVH and LVEF approximately 15-20% and low cardiac output. Wall motion abnormalities and abnormal septal motion as outlined. Grade 2 diastolic dysfunction with increased filling pressures. Moderate to severe left atrial enlargement. Mild mitral regurgitation. Abnormal aortic valve as defined above - cannot exclude significant aortic stenosis based on available information. Device wire in right heart. Severe RV dysfunction. Moderate tricuspid regurgitation with PASP 52 mmHg and elevated CVP.  ICD information  He  has appt scheduled on June 18, [employer] # [id number]. Lead [employer] #[id number] [facility] serial number [id number]. Placed November 13, 2012. I have asked that records be sent to our office in [place] for further information to help with history and records.   Cath 10/2012 [facility] "Patent coronaries"        Assessment and Plan  1. Chronic systolic heart failure/ NICM  - long history with most of his management in [place], [place] at [facility]  - most recent echo 5/2015LVEF 15-20%, severe RV dysfunction, he is NYHA III, with a [employer] ICD.  - will not further titrate ARB due to his renal dysfunction. Avoiding aldactone.  - likely initiate hydral/nitrates at next visit - he has very severe biventricular failure and probably moderate if not severe AS of his  prosthetic aortic valve. Given his complex heart failure will refer to CHF clinic for assistance with management. He may need consideration of advanced therapies in the near future given his severe disease.  2. CKD - renal function remains stable, continue to follow  3. Aortic stenosis - request records from AVR surgery to clarify the timing and type of valve placed. I would be surprised if this degree of calcifications if just 3 years ago - morphologically stenosis appears severe, dimensionless index 0.30. Gradient understated in setting of severe LV dysfunction - once records available, consider further evaluation with possible TEE, vs possible dobutamine AV study. Will f/u thoughts of CHF clinic. Not sure he would be a candidate for redo procedure, or if considered for potential assist device in the near future if even warranted evaluating further.   4. Afib? - mentioned briefly in notes, unclear history. Has not been on anticoag. No afib noted on device check - continue to follow  5. Hx of CVA - f/u with neuro, defer dosing and use of ASA and plavix to them  6. OSA - patient is to f/u  with Keith Young and discuss findings of prior testing.   7. HTN - weaning clonidine, decreased to 0.1 patch. Discontinue next visit.  - continue other meds, likely start hydral/nitrates at next visit.         Arnoldo Lenis, M.D.

## 2014-07-09 ENCOUNTER — Encounter: Payer: Self-pay | Admitting: Nurse Practitioner

## 2014-07-09 ENCOUNTER — Ambulatory Visit (INDEPENDENT_AMBULATORY_CARE_PROVIDER_SITE_OTHER): Payer: Medicare HMO | Admitting: Nurse Practitioner

## 2014-07-09 VITALS — BP 91/55 | HR 64 | Temp 97.4°F | Ht 62.0 in | Wt 126.0 lb

## 2014-07-09 DIAGNOSIS — D631 Anemia in chronic kidney disease: Secondary | ICD-10-CM

## 2014-07-09 DIAGNOSIS — R945 Abnormal results of liver function studies: Principal | ICD-10-CM

## 2014-07-09 DIAGNOSIS — N189 Chronic kidney disease, unspecified: Secondary | ICD-10-CM

## 2014-07-09 DIAGNOSIS — R7989 Other specified abnormal findings of blood chemistry: Secondary | ICD-10-CM

## 2014-07-09 LAB — CBC
HCT: 31.7 % — ABNORMAL LOW (ref 39.0–52.0)
Hemoglobin: 10.4 g/dL — ABNORMAL LOW (ref 13.0–17.0)
MCH: 28.3 pg (ref 26.0–34.0)
MCHC: 32.8 g/dL (ref 30.0–36.0)
MCV: 86.4 fL (ref 78.0–100.0)
MPV: 9.6 fL (ref 8.6–12.4)
PLATELETS: 164 10*3/uL (ref 150–400)
RBC: 3.67 MIL/uL — AB (ref 4.22–5.81)
RDW: 13 % (ref 11.5–15.5)
WBC: 3 10*3/uL — ABNORMAL LOW (ref 4.0–10.5)

## 2014-07-09 MED ORDER — PANTOPRAZOLE SODIUM 40 MG PO TBEC: 40.0000 mg | DELAYED_RELEASE_TABLET | Freq: Every day | ORAL | Status: DC

## 2014-07-09 NOTE — Assessment & Plan Note (Signed)
Noted in May 2015 alk phos 265 with mildly elevated transaminases predominantly cholestatic patter. Recheck in 04/2014 showed normalized alk phos, AST mildly elevated at 52. US Abdomen: liver with gallstones and sludge w/o evidence of acute cholecystitis, liver, pancreas, and cbd no acute abnormality. Liver normal echotexture. Will recheck LFTs today. Asymptomatic from a GI perspective. Routine follow-up in 6 months.

## 2014-07-09 NOTE — Patient Instructions (Signed)
1. Have your labs drawn when you can 2. We will call with results. 3. Return for follow-up in 6 months.

## 2014-07-09 NOTE — Progress Notes (Signed)
Referring Provider: [name], MD Primary Care Physician:  [name], MD Primary GI: Dr. Gala Romney  Chief Complaint  Patient presents with  . Anemia    HPI:   64 year old male here for follow-up on anemia and elevated liver enzymes. Last labs per below, alk phos had normalized, AST mildly elevated at 52. Hep C negative. US Abdomen: liver with gallstones and sludge w/o evidence of acute cholecystitis, liver, pancreas, and cbd no acute abnormality. Liver normal echotexture. Last EGD 04/10/14 mild erosive reflux esophagitis, multiple antral polyps likely hyperplastic s/p removal, diffusely abnormal stomach; suspect anemia partially due to intermittent stomach oozing, no attempt to remove all gastric polyps due to risk. Path shower hyperplastic polyp w/o dysplasia/malignancy, chronic gastritis with intestinal metaplasia negative for H. Pylori. Colonoscopy 04/10/14 with scattered left-side diverticula otherwise normal.  States lately he's been doing well. States he's feeling pretty good. Energy level is good, has been walking regularly. Denies hematochezia and melena. Denies abdominal pain, N/V. Has regular bowel movmenets which are soft/formed and without straining. Denies dyspepsia. Denies any other upper or lower GI symptoms.  Past Medical History  Diagnosis Date  . Chronic systolic heart failure     NYHA Class III  . Type 2 diabetes mellitus   . Nonischemic cardiomyopathy     EF 15-20%  . Essential hypertension, benign   . CKD (chronic kidney disease) stage 3, GFR 30-59 ml/min   . Anemia   . Hyperlipidemia   . History of stroke   . Bell's palsy   . Cardiomyopathy   . Aortic insufficiency   . Alcoholism in remission   . Noncompliance with medication regimen   . NSVT (nonsustained ventricular tachycardia)   . Cardiac defibrillator in situ   . COPD (chronic obstructive pulmonary disease)   . CVA (cerebral infarction) 01/2013  . Productive cough 08/2013    With brown  sputum   . SOB (shortness of breath) on exertion 08/2013  . Numbness of right jaw     Had a stoke in 01/2013. Numbness is occasional, especially when trying to chew.  . At moderate risk for fall   . AICD (automatic cardioverter/defibrillator) present   . Stroke     weakness of right side from CVA  . Arthritis     Past Surgical History  Procedure Laterality Date  . Cardiac defibrillator placement  11/12/12    [employer] Inogen MINI ICD implanted in [place] at [facility]  . Cardiac surgery      bioprostetic AVR per Dr Nelly Laurence note  . Colonoscopy with propofol N/A 04/10/2014    RMR: Colonic Diverticulosis  . Esophagogastroduodenoscopy (egd) with propofol N/A 04/10/2014    RMR: Mild erosive reflux esophagitis. Multiple antral polyps likely hyperplastic status post removal by hot snare cautery technique. Diffusely abnormal stomach status post gastric biopsy I suspect some of patients anemaia may be due to intermittent oozing from the stomach. It would be difficult and a risky proposition to attempt complete removal of all of his gastric polyps.  . Polypectomy N/A 04/10/2014    Procedure: GASTRIC POLYPECTOMY;  Surgeon: Daneil Dolin, MD;  Location: [facility];  Service: Endoscopy;  Laterality: N/A;  . Esophageal biopsy N/A 04/10/2014    Procedure: GASTRIC BIOPSY;  Surgeon: Daneil Dolin, MD;  Location: [facility];  Service: Endoscopy;  Laterality: N/A;  . Right heart catheterization N/A 02/06/2014    Procedure: RIGHT HEART CATH;  Surgeon: Larey Dresser, MD;  Location: [facility] CATH  LAB;  Service: Cardiovascular;  Laterality: N/A;    Current Outpatient Prescriptions  Medication Sig Dispense Refill  . aspirin 81 MG EC tablet Take 1 tablet (81 mg total) by mouth daily.    Marland Kitchen atorvastatin (LIPITOR) 80 MG tablet Take 80 mg by mouth daily.    . carvedilol (COREG) 25 MG tablet Take 12.5 mg by mouth 2 (two) times daily with a meal.     . Cholecalciferol (VITAMIN D3) 2000 UNITS TABS Take  2,000 mg by mouth daily.    . digoxin (LANOXIN) 0.125 MG tablet Take 0.5 tablets (0.0625 mg total) by mouth daily. 15 tablet 6  . ferrous sulfate 325 (65 FE) MG tablet Take 325 mg by mouth daily with breakfast.    . furosemide (LASIX) 80 MG tablet Take 40 mg by mouth 2 (two) times daily.     . hydrALAZINE (APRESOLINE) 25 MG tablet Take 3 tablets by mouth 3 (three) times daily.    . insulin lispro (HUMALOG) 100 UNIT/ML injection Inject 2-6 Units into the skin 3 (three) times daily with meals.    . isosorbide dinitrate (ISORDIL) 20 MG tablet Take 2 tablets by mouth 3 (three) times daily.    Marland Kitchen losartan (COZAAR) 25 MG tablet TAKE 1 TABLET BY MOUTH ONCE A DAY. 30 tablet 0  . pantoprazole (PROTONIX) 40 MG tablet Take 40 mg by mouth daily.     . REA LO 40 40 % CREA   3  . spironolactone (ALDACTONE) 25 MG tablet TAKE 1/2 TABLET (12.5MG) BY MOUTH ONCE DAILY. 15 tablet 6  . polyethylene glycol-electrolytes (TRILYTE) 420 G solution Take 4,000 mLs by mouth as directed. (Patient not taking: Reported on 07/09/2014) 4000 mL 0   No current facility-administered medications for this visit.    Allergies as of 07/09/2014  . (No Known Allergies)    Family History  Problem Relation Age of Onset  . Kidney disease Mother   . Kidney disease Sister   . Colon cancer Neg Hx     History   Social History  . Marital Status: Single    Spouse Name: N/A  . Number of Children: N/A  . Years of Education: N/A   Social History Main Topics  . Smoking status: Former Smoker    Types: Cigarettes    Quit date: 11/05/1993  . Smokeless tobacco: Never Used  . Alcohol Use: Yes     Comment: former heavy ETOH, none recently  . Drug Use: No     Comment: prior history of cocaine use, smoking   . Sexual Activity: Yes    Birth Control/ Protection: None   Other Topics Concern  . None   Social History Narrative   Recently moved from [place] to be with family in [place]                Review of Systems: Gen:  Denies fever, chills, anorexia. Denies fatigue, weakness, weight loss.  CV: Denies chest pain, palpitations, syncope, peripheral edema. Resp: Denies dyspnea at rest, wheezing, coughing up blood. GI: See HPI. Denies vomiting blood, jaundice, and fecal incontinence.   Denies dysphagia or odynophagia. Derm: Denies rash, itching, dry skin Psych: Denies depression, anxiety, memory loss, confusion.  Heme: Denies bruising, bleeding, and enlarged lymph nodes.  Physical Exam: BP 91/55 mmHg  Pulse 64  Temp(Src) 97.4 F (36.3 C) (Oral)  Ht '5\' 2"'  (1.575 m)  Wt 126 lb (57.153 kg)  BMI 23.04 kg/m2 General:   Alert and oriented. No distress noted. Pleasant  and cooperative.  Head:  Normocephalic and atraumatic. Eyes:  Conjuctiva clear without scleral icterus. Mouth:  Oral mucosa pink and moist. Good dentition. No lesions. Lungs:  Clear to auscultation bilaterally. No wheezes, rales, or rhonchi. No distress.  Heart:  S1, S2 present without rubs, or gallops. Pansystolic murmur noted. Regular rate and rhythm. Abdomen:  +BS, soft, non-tender and non-distended. No rebound or guarding. No HSM or masses noted. Msk:  Symmetrical without gross deformities. Normal posture. Extremities:  Without edema. Neurologic:  Alert and  oriented x4;  grossly normal neurologically. Skin:  Intact without significant lesions or rashes. Psych:  Alert and cooperative. Normal mood and affect.  Lab Results  Component Value Date   WBC 2.6* 06/24/2014   HGB 10.1* 06/24/2014   HCT 30.7* 06/24/2014   MCV 87.2 06/24/2014   PLT 157 06/24/2014     07/09/2014 10:55 AM

## 2014-07-10 LAB — COMPREHENSIVE METABOLIC PANEL
ALT: 29 U/L (ref 0–53)
AST: 35 U/L (ref 0–37)
Albumin: 3.4 g/dL — ABNORMAL LOW (ref 3.5–5.2)
Alkaline Phosphatase: 91 U/L (ref 39–117)
BILIRUBIN TOTAL: 0.4 mg/dL (ref 0.2–1.2)
BUN: 68 mg/dL — ABNORMAL HIGH (ref 6–23)
CALCIUM: 9.6 mg/dL (ref 8.4–10.5)
CO2: 25 meq/L (ref 19–32)
Chloride: 101 mEq/L (ref 96–112)
Creat: 2.33 mg/dL — ABNORMAL HIGH (ref 0.50–1.35)
GLUCOSE: 101 mg/dL — AB (ref 70–99)
Potassium: 5 mEq/L (ref 3.5–5.3)
SODIUM: 134 meq/L — AB (ref 135–145)
Total Protein: 7.9 g/dL (ref 6.0–8.3)

## 2014-07-19 NOTE — Progress Notes (Signed)
CC'ED TO PCP 

## 2014-07-22 ENCOUNTER — Ambulatory Visit (HOSPITAL_COMMUNITY)
Admission: RE | Admit: 2014-07-22 | Discharge: 2014-07-22 | Disposition: A | Discharge status: Home / Self Care | Payer: Medicare HMO | Source: Ambulatory Visit | Attending: Internal Medicine | Admitting: Internal Medicine

## 2014-07-22 VITALS — BP 82/44 | HR 68 | Wt 128.0 lb

## 2014-07-22 DIAGNOSIS — N189 Chronic kidney disease, unspecified: Secondary | ICD-10-CM | POA: Diagnosis not present

## 2014-07-22 DIAGNOSIS — I5022 Chronic systolic (congestive) heart failure: Secondary | ICD-10-CM

## 2014-07-22 DIAGNOSIS — R0989 Other specified symptoms and signs involving the circulatory and respiratory systems: Secondary | ICD-10-CM | POA: Diagnosis not present

## 2014-07-22 DIAGNOSIS — E785 Hyperlipidemia, unspecified: Secondary | ICD-10-CM | POA: Insufficient documentation

## 2014-07-22 DIAGNOSIS — Z8673 Personal history of transient ischemic attack (TIA), and cerebral infarction without residual deficits: Secondary | ICD-10-CM | POA: Diagnosis not present

## 2014-07-22 DIAGNOSIS — I5020 Unspecified systolic (congestive) heart failure: Secondary | ICD-10-CM

## 2014-07-22 DIAGNOSIS — N183 Chronic kidney disease, stage 3 (moderate): Secondary | ICD-10-CM

## 2014-07-22 LAB — BASIC METABOLIC PANEL
ANION GAP: 6 (ref 5–15)
BUN: 63 mg/dL — ABNORMAL HIGH (ref 6–23)
CO2: 23 mmol/L (ref 19–32)
CREATININE: 2.22 mg/dL — AB (ref 0.50–1.35)
Calcium: 8.9 mg/dL (ref 8.4–10.5)
Chloride: 106 mmol/L (ref 96–112)
GFR, EST AFRICAN AMERICAN: 35 mL/min — AB (ref >90)
GFR, EST NON AFRICAN AMERICAN: 30 mL/min — AB (ref >90)
GLUCOSE: 190 mg/dL — AB (ref 70–99)
Potassium: 3.7 mmol/L (ref 3.5–5.1)
Sodium: 135 mmol/L (ref 135–145)

## 2014-07-22 LAB — LIPID PANEL
CHOLESTEROL: 93 mg/dL (ref 0–200)
HDL: 22 mg/dL — AB (ref >39)
LDL CALC: 44 mg/dL (ref 0–99)
Total CHOL/HDL Ratio: 4.2 RATIO
Triglycerides: 134 mg/dL (ref <150)
VLDL: 27 mg/dL (ref 0–40)

## 2014-07-22 LAB — DIGOXIN LEVEL: Digoxin Level: 0.6 ng/mL — ABNORMAL LOW (ref 0.8–2.0)

## 2014-07-22 MED ORDER — FUROSEMIDE 40 MG PO TABS: 40.0000 mg | ORAL_TABLET | Freq: Every day | ORAL | Status: DC

## 2014-07-22 NOTE — Progress Notes (Signed)
Patient ID: Keith Young, male   DOB: 1951/03/30, 64 y.o.   MRN: [id number] PCP: Dr. Luan Pulling Primary Cardiology: Dr. Harl Bowie  64 yo with history of nonischemic cardiomyopathy and aortic valve replacement presents for CHF clinic evaluation.  Patient is followed in [facility] by Dr. Harl Bowie.  He recently moved to [place] from [place] to live in an adult care home run by his sister and brother-in-law.  Most recent echo in 5/15 showed EF 15-20% with RV dysfunction.  He had left heart cath in [place] in 2014 with no significant CAD.  Cause of cardiomyopathy thought to be h/o ETOH abuse + HTN.  He has a [name].  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  He had a CPX done in 8/15 that showed severe functional limitation, but RHC in 9/15 showed normal filling pressures and preserved cardiac output.   He is here with his brother-in-law. Says he feels good. Walks up and down the driveway which is 35 feet. Says he feels fine if he walks slowly. No SOB, no chest pain.  No orthopnea or PND. No edema.  Weight is down about 9 lbs.  He has had his teeth pulled and does not have dentures yet, not eating as much.  Of note, last BMET was earlier this month and showed creatinine up to 2.33. BP is low today but he denies lightheadedness.  No falls or syncope.    CPX 8/15 Resting HR: 73 Peak HR: 95 (60% age predicted max HR) BP rest: 118/76 BP peak: 130/68 Peak VO2: 8.9 (28.6% predicted peak VO2) VE/VCO2 slope: 38.1 OUES: 0.80 Peak RER: 1.14 Ventilatory Threshold: Could not be detected VE/MVV: 23.6% PETCO2 at peak: 32 O2pulse: 6 (50% predicted O2pulse)  RHC 02/06/14 Hemodynamics (mmHg)  RA mean 3  RV 35/7  PA 36/14, mean 23  PCWP mean 7  Oxygen saturations:  PA 66%  AO 95%  Cardiac Output (Fick) 5.77  Cardiac Index (Fick) 3.63   Labs (6/15): K 4.7, creatinine  1.6 Labs (9/15): K 4.3=>4.8, creatinine 1.46 => 1.64, digoxin 1.3 Labs (11/15): digoxin 0.6 Labs (2/16): K 5, creatinine 2.33, BUN 68, hgb 10.4  PMH: 1. Nonischemic cardiomyopathy: Echo (5/15) with moderate LV dilation, EF 15-20%, grade II diastolic dysfunction, mild MR, moderate-severe LAE, bioprosthetic aortic valve with mean gradient 10 mmHg, moderate RV dilation with moderate to severely decreased systolic function, moderate TR, PA systolic pressure 52 mmHg (similar to prior echoes from [facility] in [place]).  LHC (6/14 in [place]) showed no significant coronary disease.  History of prior ETOH abuse as well as HTN that may have contributed.  [name].  CPX (8/15): peak VO2 8.9, VE/VCO2 slope 38.1, RER 1.14 (severe functional limitation).  RHC (9/15) with mean RA 3, PA 36/14, mean PCWP 7, CI 3.63.  2. ACEI cough.  3. ETOH abuse history 4. 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 5. ? Prior atrial fibrillation but patient has not been anticoagulated and device interrogation has not shown atrial fibrillation.  6. H/o CVA: Followed by neurology in [place].  7. HTN 8. Type II diabetes 9. Hyperlipidemia 10. Bells palsy 11. OSA: Awaiting CPAP. 12. CKD 13. Carotid stenosis: Carotid dopplers (7/15) with < 50% bilateral ICA stenosis.   SH: Prior heavy ETOH, now none.  Quit smoking about 20 years  ago.  Lives in [place] at adult care facility run by sister and brother-in-law.    FH: CKD, no cardiomyopathy that he is aware of.   ROS: All systems reviewed and negative except as per HPI.   Current Outpatient Prescriptions  Medication Sig Dispense Refill  . aspirin 81 MG EC tablet Take 1 tablet (81 mg total) by mouth daily.    Marland Kitchen atorvastatin (LIPITOR) 80 MG tablet Take 80 mg by mouth daily.    . carvedilol (COREG) 25 MG tablet Take 12.5 mg by mouth 2 (two)  times daily with a meal.     . Cholecalciferol (VITAMIN D3) 2000 UNITS TABS Take 2,000 mg by mouth daily.    . digoxin (LANOXIN) 0.125 MG tablet Take 0.5 tablets (0.0625 mg total) by mouth daily. 15 tablet 6  . ferrous sulfate 325 (65 FE) MG tablet Take 325 mg by mouth daily with breakfast.    . furosemide (LASIX) 40 MG tablet Take 1 tablet (40 mg total) by mouth daily. 90 tablet 3  . hydrALAZINE (APRESOLINE) 25 MG tablet Take 3 tablets by mouth 3 (three) times daily.    . insulin lispro (HUMALOG) 100 UNIT/ML injection Inject 2-6 Units into the skin 3 (three) times daily with meals.    . isosorbide dinitrate (ISORDIL) 20 MG tablet Take 2 tablets by mouth 3 (three) times daily.    . pantoprazole (PROTONIX) 40 MG tablet Take 1 tablet (40 mg total) by mouth daily. 30 tablet 3  . polyethylene glycol-electrolytes (TRILYTE) 420 G solution Take 4,000 mLs by mouth as directed. (Patient not taking: Reported on 07/09/2014) 4000 mL 0  . REA LO 40 40 % CREA   3   No current facility-administered medications for this encounter.    BP 82/44 mmHg  Pulse 68  Wt 128 lb (58.06 kg)  SpO2 98% General: NAD HEENT: L facial droop Neck: JVP flat cm, no thyromegaly or thyroid nodule. R carotid bruit Lungs: Clear to auscultation bilaterally with normal respiratory effort. CV: Nondisplaced PMI.  Heart regular S1/S2, no [id number], 2/6 systolic murmur RUSB with clear S2.  No peripheral edema.  Left carotid bruit.  Normal pedal pulses.  Abdomen: Soft, nontender, no hepatosplenomegaly, no distention.  Skin: Intact without lesions or rashes.  Neurologic: Alert and oriented x 3.  Psych: Normal affect. Extremities: No clubbing or cyanosis.   Assessment/Plan:  1. Chronic systolic CHF: Nonischemic cardiomyopathy with biventricular failure, probably due to ETOH abuse (prior) + HTN.  EF 15-20% on last echo with moderate to severe RV dysfunction.  [name]. CPX showed severe functional limitation.  RHC 9/15,  however, was well compensated with good cardiac output.  Symptomatically he is doing well.  He is not volume overloaded.  - Continue current Coreg and digoxin.  I will check digoxin level today. - Continue hydralazine/isordil.  - With rise in creatinine and fall in weight, I will have him stop losartan and spironolactone.  He will also decrease Lasix to once a day.  I will check BMET today and again in 2 wks.   - Not VAD candidate due to cognitive issues and RV failure. - Followup in the office in 2 wks for reassessment given med changes.  2. H/o CVA: He is on Plavix, ASA, and statin.    3. Hyperlipidemia: Goal LDL < 70 with CVA history.  Check lipids today. 4. Carotid bruit: On left, has history of CVA.  Mild stenosis on carotid dopplers.   5. CKD: As above, creatinine  has been up.  Stopping spironolactone and losartan and decreasing Lasix.  BMET today and in 2 wks, will also try to arrange followup for him with his nephrologist, Dr Lowanda Foster.  6. Bioprosthetic aortic valve: There is a question of possible moderate bioprosthetic valve stenosis.  Will need to follow closely, repeat echo in 5/16.   Followup in 2 wks  Loralie Champagne MD 07/22/2014

## 2014-07-22 NOTE — Patient Instructions (Signed)
STOP Losartan.  STOP Spironolactone.  DECREASE Lasix to 40mg  once daily.  Follow up 2 weeks.  Will Schedule echocardiogram for May.  Do the following things EVERYDAY: 1) Weigh yourself in the morning before breakfast. Write it down and keep it in a log. 2) Take your medicines as prescribed 3) Eat low salt foods-Limit salt (sodium) to 2000 mg per day.  4) Stay as active as you can everyday 5) Limit all fluids for the day to less than 2 liters

## 2014-08-05 ENCOUNTER — Ambulatory Visit (HOSPITAL_COMMUNITY)
Admission: RE | Admit: 2014-08-05 | Discharge: 2014-08-05 | Disposition: A | Discharge status: Home / Self Care | Payer: Medicare HMO | Source: Ambulatory Visit | Attending: Cardiology | Admitting: Cardiology

## 2014-08-05 ENCOUNTER — Encounter (HOSPITAL_COMMUNITY): Payer: Self-pay

## 2014-08-05 VITALS — BP 104/58 | HR 72 | Wt 127.5 lb

## 2014-08-05 DIAGNOSIS — I5022 Chronic systolic (congestive) heart failure: Secondary | ICD-10-CM | POA: Diagnosis not present

## 2014-08-05 DIAGNOSIS — E785 Hyperlipidemia, unspecified: Secondary | ICD-10-CM | POA: Diagnosis not present

## 2014-08-05 DIAGNOSIS — R0989 Other specified symptoms and signs involving the circulatory and respiratory systems: Secondary | ICD-10-CM | POA: Diagnosis not present

## 2014-08-05 DIAGNOSIS — Z8673 Personal history of transient ischemic attack (TIA), and cerebral infarction without residual deficits: Secondary | ICD-10-CM | POA: Insufficient documentation

## 2014-08-05 DIAGNOSIS — N183 Chronic kidney disease, stage 3 (moderate): Secondary | ICD-10-CM

## 2014-08-05 DIAGNOSIS — N189 Chronic kidney disease, unspecified: Secondary | ICD-10-CM | POA: Diagnosis not present

## 2014-08-05 LAB — BASIC METABOLIC PANEL
ANION GAP: 9 (ref 5–15)
BUN: 28 mg/dL — ABNORMAL HIGH (ref 6–23)
CALCIUM: 9 mg/dL (ref 8.4–10.5)
CO2: 23 mmol/L (ref 19–32)
Chloride: 104 mmol/L (ref 96–112)
Creatinine, Ser: 1.69 mg/dL — ABNORMAL HIGH (ref 0.50–1.35)
GFR calc Af Amer: 48 mL/min — ABNORMAL LOW (ref >90)
GFR, EST NON AFRICAN AMERICAN: 42 mL/min — AB (ref >90)
GLUCOSE: 110 mg/dL — AB (ref 70–99)
Potassium: 3.9 mmol/L (ref 3.5–5.1)
SODIUM: 136 mmol/L (ref 135–145)

## 2014-08-05 LAB — DIGOXIN LEVEL: Digoxin Level: 0.4 ng/mL — ABNORMAL LOW (ref 0.8–2.0)

## 2014-08-05 LAB — BRAIN NATRIURETIC PEPTIDE: B NATRIURETIC PEPTIDE 5: 377.2 pg/mL — AB (ref 0.0–100.0)

## 2014-08-05 NOTE — Patient Instructions (Signed)
Labs today  Your physician recommends that you schedule a follow-up appointment in: 6 weeks  

## 2014-08-06 ENCOUNTER — Ambulatory Visit: Payer: Medicare HMO | Admitting: Cardiology

## 2014-08-06 NOTE — Progress Notes (Signed)
Patient ID: Keith Young, male   DOB: April 28, 1951, 64 y.o.   MRN: [id number] PCP: Dr. Luan Pulling Primary Cardiology: Dr. Harl Bowie  64 yo with history of nonischemic cardiomyopathy and aortic valve replacement presents for CHF clinic evaluation.  Patient is followed in [facility] by Dr. Harl Bowie.  He recently moved to [place] from [place] to live in an adult care home run by his sister and brother-in-law.  Most recent echo in 5/15 showed EF 15-20% with RV dysfunction.  He had left heart cath in [place] in 2014 with no significant CAD.  Cause of cardiomyopathy thought to be h/o ETOH abuse + HTN.  He has a [name].  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  He had a CPX done in 8/15 that showed severe functional limitation, but RHC in 9/15 showed normal filling pressures and preserved cardiac output. At last appointment, creatinine was noted to be up to 2.33 and he was hypotensive.  I stopped his losartan and spironolactone and decreased Lasix to once daily.   Weight is stable.  Says he feels good. Walks up and down the driveway which is 35 feet. Says he feels fine if he walks slowly. No SOB, no chest pain.  No orthopnea or PND. No edema. BP is better today.    CPX 8/15 Resting HR: 73 Peak HR: 95 (60% age predicted max HR) BP rest: 118/76 BP peak: 130/68 Peak VO2: 8.9 (28.6% predicted peak VO2) VE/VCO2 slope: 38.1 OUES: 0.80 Peak RER: 1.14 Ventilatory Threshold: Could not be detected VE/MVV: 23.6% PETCO2 at peak: 32 O2pulse: 6 (50% predicted O2pulse)  RHC 02/06/14 Hemodynamics (mmHg)  RA mean 3  RV 35/7  PA 36/14, mean 23  PCWP mean 7  Oxygen saturations:  PA 66%  AO 95%  Cardiac Output (Fick) 5.77  Cardiac Index (Fick) 3.63   Labs (6/15): K 4.7, creatinine 1.6 Labs (9/15): K 4.3=>4.8, creatinine 1.46 => 1.64, digoxin 1.3 Labs (11/15): digoxin 0.6 Labs  (2/16): K 5, creatinine 2.33, BUN 68, hgb 10.4, LDL 44  PMH: 1. Nonischemic cardiomyopathy: Echo (5/15) with moderate LV dilation, EF 15-20%, grade II diastolic dysfunction, mild MR, moderate-severe LAE, bioprosthetic aortic valve with mean gradient 10 mmHg, moderate RV dilation with moderate to severely decreased systolic function, moderate TR, PA systolic pressure 52 mmHg (similar to prior echoes from [facility] in [place]).  LHC (6/14 in [place]) showed no significant coronary disease.  History of prior ETOH abuse as well as HTN that may have contributed.  [place].  CPX (8/15): peak VO2 8.9, VE/VCO2 slope 38.1, RER 1.14 (severe functional limitation).  RHC (9/15) with mean RA 3, PA 36/14, mean PCWP 7, CI 3.63.  2. ACEI cough.  3. ETOH abuse history 4. 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 5. ? Prior atrial fibrillation but patient has not been anticoagulated and device interrogation has not shown atrial fibrillation.  6. H/o CVA: Followed by neurology in [place].  7. HTN 8. Type II diabetes 9. Hyperlipidemia 10. Bells palsy 11. OSA: Awaiting CPAP. 12. CKD 13. Carotid stenosis: Carotid dopplers (7/15) with < 50% bilateral ICA stenosis.   SH: Prior heavy ETOH, now none.  Quit smoking about 20 years ago.  Lives in [place] at adult care facility run by sister and brother-in-law.    FH: CKD, no  cardiomyopathy that he is aware of.   ROS: All systems reviewed and negative except as per HPI.   Current Outpatient Prescriptions  Medication Sig Dispense Refill  . aspirin 81 MG EC tablet Take 1 tablet (81 mg total) by mouth daily.    Marland Kitchen atorvastatin (LIPITOR) 80 MG tablet Take 80 mg by mouth daily.    . carvedilol (COREG) 25 MG tablet Take 12.5 mg by mouth 2 (two) times daily with a meal.     . Cholecalciferol (VITAMIN D3) 2000 UNITS TABS Take 2,000 mg by  mouth daily.    . digoxin (LANOXIN) 0.125 MG tablet Take 0.5 tablets (0.0625 mg total) by mouth daily. 15 tablet 6  . ferrous sulfate 325 (65 FE) MG tablet Take 325 mg by mouth daily with breakfast.    . furosemide (LASIX) 40 MG tablet Take 1 tablet (40 mg total) by mouth daily. 90 tablet 3  . hydrALAZINE (APRESOLINE) 25 MG tablet Take 3 tablets by mouth 3 (three) times daily.    . insulin lispro (HUMALOG) 100 UNIT/ML injection Inject 2-6 Units into the skin 3 (three) times daily with meals.    . isosorbide dinitrate (ISORDIL) 20 MG tablet Take 2 tablets by mouth 3 (three) times daily.    . pantoprazole (PROTONIX) 40 MG tablet Take 1 tablet (40 mg total) by mouth daily. 30 tablet 3  . polyethylene glycol-electrolytes (TRILYTE) 420 G solution Take 4,000 mLs by mouth as directed. 4000 mL 0  . REA LO 40 40 % CREA   3   No current facility-administered medications for this encounter.    BP 104/58 mmHg  Pulse 72  Wt 127 lb 8 oz (57.834 kg)  SpO2 98% General: NAD HEENT: L facial droop Neck: JVP flat cm, no thyromegaly or thyroid nodule. R carotid bruit Lungs: Clear to auscultation bilaterally with normal respiratory effort. CV: Nondisplaced PMI.  Heart regular S1/S2, no [id number], 2/6 systolic murmur RUSB with clear S2.  No peripheral edema.  Left carotid bruit.  Normal pedal pulses.  Abdomen: Soft, nontender, no hepatosplenomegaly, no distention.  Skin: Intact without lesions or rashes.  Neurologic: Alert and oriented x 3.  Psych: Normal affect. Extremities: No clubbing or cyanosis.   Assessment/Plan:  1. Chronic systolic CHF: Nonischemic cardiomyopathy with biventricular failure, probably due to ETOH abuse (prior) + HTN.  EF 15-20% on last echo with moderate to severe RV dysfunction.  [name]. CPX showed severe functional limitation.  RHC 9/15, however, was well compensated with good cardiac output.  Symptomatically he is doing well.  He is not volume overloaded.  - Continue  current Coreg and digoxin.  I will check digoxin level today. - Continue hydralazine/isordil.  - Continue Lasix 40 mg daily.  - Stay off lisinopril and spironolactone.  I will check BMET today.   - Not VAD candidate due to cognitive issues and RV failure. - Followup in the office in 6 wks.  2. H/o CVA: He is on Plavix, ASA, and statin.    3. Hyperlipidemia: Goal LDL < 70 with CVA history.  Good LDL when recently checked.  4. Carotid bruit: On left, has history of CVA.  Mild stenosis on carotid dopplers.   5. CKD: As above, creatinine has been up.  Stopped spironolactone and losartan and decreased Lasix.  BMET today.  6. Bioprosthetic aortic valve: There is a question of possible moderate bioprosthetic valve stenosis.  Will need to follow closely, repeat echo in 5/16.   Loralie Champagne MD 08/06/2014

## 2014-09-05 ENCOUNTER — Other Ambulatory Visit (HOSPITAL_COMMUNITY): Payer: Self-pay | Admitting: Internal Medicine

## 2014-09-15 ENCOUNTER — Encounter: Payer: Self-pay | Admitting: Internal Medicine

## 2014-09-16 ENCOUNTER — Encounter (HOSPITAL_COMMUNITY): Payer: Medicare HMO

## 2014-09-17 ENCOUNTER — Ambulatory Visit (HOSPITAL_COMMUNITY)
Admission: RE | Admit: 2014-09-17 | Discharge: 2014-09-17 | Disposition: A | Discharge status: Home / Self Care | Payer: Medicare HMO | Source: Ambulatory Visit | Attending: Internal Medicine | Admitting: Internal Medicine

## 2014-09-17 VITALS — BP 124/56 | HR 65 | Wt 127.8 lb

## 2014-09-17 DIAGNOSIS — I5022 Chronic systolic (congestive) heart failure: Secondary | ICD-10-CM | POA: Diagnosis not present

## 2014-09-17 DIAGNOSIS — N183 Chronic kidney disease, stage 3 (moderate): Secondary | ICD-10-CM

## 2014-09-17 LAB — BASIC METABOLIC PANEL
Anion gap: 6 (ref 5–15)
BUN: 27 mg/dL — ABNORMAL HIGH (ref 6–23)
CALCIUM: 9.1 mg/dL (ref 8.4–10.5)
CO2: 28 mmol/L (ref 19–32)
CREATININE: 1.78 mg/dL — AB (ref 0.50–1.35)
Chloride: 103 mmol/L (ref 96–112)
GFR calc Af Amer: 45 mL/min — ABNORMAL LOW (ref >90)
GFR calc non Af Amer: 39 mL/min — ABNORMAL LOW (ref >90)
GLUCOSE: 94 mg/dL (ref 70–99)
Potassium: 4.4 mmol/L (ref 3.5–5.1)
Sodium: 137 mmol/L (ref 135–145)

## 2014-09-17 MED ORDER — LOSARTAN POTASSIUM 25 MG PO TABS: 25.0000 mg | ORAL_TABLET | Freq: Every day | ORAL | Status: DC

## 2014-09-17 NOTE — Progress Notes (Signed)
Patient ID: Keith Young, male   DOB: [birth year], 64 y.o.   MRN: [id number] PCP: Dr. Luan Pulling Primary Cardiology: Dr. Harl Bowie  64 yo with history of nonischemic cardiomyopathy and aortic valve replacement presents for CHF clinic evaluation.  Patient is followed in [facility] by Dr. Harl Bowie.  He recently moved to [place] from [place] to live in an adult care home run by his sister and brother-in-law.  Most recent echo in 5/15 showed EF 15-20% with RV dysfunction.  He had left heart cath in [place] in 2014 with no significant CAD.  Cause of cardiomyopathy thought to be h/o ETOH abuse + HTN.  He has a [place].  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  He had a CPX done in 8/15 that showed severe functional limitation, but RHC in 9/15 showed normal filling pressures and preserved cardiac output.   Returns for f/u. He is here with his brother-in-law. Says he feels good. At last visit, creatinine was up and weight down. Losartan and spironolactone stopped. Lasix decreased to once a day. Renal function subsequently back to baseline. Weight stable. Walks up and down the driveway which is 35 feet a couple times per day. Roney Jaffe he feels fine if he walks slowly. No SOB, no chest pain.  No orthopnea or PND. No edema. No dizziness.   CPX 8/15 Resting HR: 73 Peak HR: 95 (60% age predicted max HR) BP rest: 118/76 BP peak: 130/68 Peak VO2: 8.9 (28.6% predicted peak VO2) VE/VCO2 slope: 38.1 OUES: 0.80 Peak RER: 1.14 Ventilatory Threshold: Could not be detected VE/MVV: 23.6% PETCO2 at peak: 32 O2pulse: 6 (50% predicted O2pulse)  RHC 02/06/14 Hemodynamics (mmHg)  RA mean 3  RV 35/7  PA 36/14, mean 23  PCWP mean 7  Oxygen saturations:  PA 66%  AO 95%  Cardiac Output (Fick) 5.77  Cardiac Index (Fick) 3.63   Labs (6/15): K 4.7, creatinine 1.6 Labs (9/15): K 4.3=>4.8,  creatinine 1.46 => 1.64, digoxin 1.3 Labs (11/15): digoxin 0.6 Labs (2/16): K 5, creatinine 2.33, BUN 68, hgb 10.4 Labs (3/16): K 3.9, creatinine 1.69  PMH: 1. Nonischemic cardiomyopathy: Echo (5/15) with moderate LV dilation, EF 15-20%, grade II diastolic dysfunction, mild MR, moderate-severe LAE, bioprosthetic aortic valve with mean gradient 10 mmHg, moderate RV dilation with moderate to severely decreased systolic function, moderate TR, PA systolic pressure 52 mmHg (similar to prior echoes from [facility] in [place]).  LHC (6/14 in [place]) showed no significant coronary disease.  History of prior ETOH abuse as well as HTN that may have contributed.  [place].  CPX (8/15): peak VO2 8.9, VE/VCO2 slope 38.1, RER 1.14 (severe functional limitation).  RHC (9/15) with mean RA 3, PA 36/14, mean PCWP 7, CI 3.63.  2. ACEI cough.  3. ETOH abuse history 4. 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 5. ? Prior atrial fibrillation but patient has not been anticoagulated and device interrogation has not shown atrial fibrillation.  6. H/o CVA: Followed by neurology in [place].  7. HTN 8. Type II diabetes 9. Hyperlipidemia 10. Bells palsy 11. OSA: Awaiting CPAP. 12. CKD 13. Carotid stenosis: Carotid dopplers (7/15) with < 50% bilateral ICA stenosis.   SH: Prior heavy ETOH, now none.  Quit smoking about 20 years ago.  Lives in [place] at adult care facility  run by sister and brother-in-law.    FH: CKD, no cardiomyopathy that he is aware of.   ROS: All systems reviewed and negative except as per HPI.   Current Outpatient Prescriptions  Medication Sig Dispense Refill  . aspirin 81 MG EC tablet Take 1 tablet (81 mg total) by mouth daily.    Marland Kitchen atorvastatin (LIPITOR) 80 MG tablet Take 80 mg by mouth daily.    . carvedilol (COREG) 25 MG tablet Take 12.5 mg by mouth 2  (two) times daily with a meal.     . Cholecalciferol (VITAMIN D3) 2000 UNITS TABS Take 2,000 mg by mouth daily.    Marland Kitchen DIGOX 125 MCG tablet TAKE 1/2 TABLET (0.0625MG ) BY MOUTH ONCE DAILY. 15 tablet 5  . ferrous sulfate 325 (65 FE) MG tablet Take 325 mg by mouth daily with breakfast.    . furosemide (LASIX) 40 MG tablet Take 1 tablet (40 mg total) by mouth daily. 90 tablet 3  . hydrALAZINE (APRESOLINE) 25 MG tablet Take 3 tablets by mouth 3 (three) times daily.    . insulin lispro (HUMALOG) 100 UNIT/ML injection Inject 2-6 Units into the skin 3 (three) times daily with meals.    . isosorbide dinitrate (ISORDIL) 20 MG tablet Take 2 tablets by mouth 3 (three) times daily.    . pantoprazole (PROTONIX) 40 MG tablet Take 1 tablet (40 mg total) by mouth daily. 30 tablet 3  . REA LO 40 40 % CREA   3   No current facility-administered medications for this encounter.    BP 124/56 mmHg  Pulse 65  Wt 127 lb 12.8 oz (57.97 kg)  SpO2 97% General: NAD HEENT: L facial droop Neck: JVP flat cm, no thyromegaly or thyroid nodule. R carotid bruit Lungs: Clear to auscultation bilaterally with normal respiratory effort. CV: Nondisplaced PMI.  Heart regular S1/S2, no [id number], 2/6 systolic murmur RUSB with clear S2.  No peripheral edema.  Left carotid bruit.  Normal pedal pulses.  Abdomen: Soft, nontender, no hepatosplenomegaly, no distention.  Skin: Intact without lesions or rashes.  Neurologic: Alert and oriented x 3.  Psych: Normal affect. Extremities: No clubbing or cyanosis.   Assessment/Plan:  1. Chronic systolic CHF: Nonischemic cardiomyopathy with biventricular failure, probably due to ETOH abuse (prior) + HTN.  EF 15-20% on last echo with moderate to severe RV dysfunction.  [place]. CPX showed severe functional limitation.  RHC 9/15, however, was well compensated with good cardiac output.  Symptomatically he is doing well.  He is not volume overloaded.  - Continue current Coreg and digoxin.   Recent digoxin level ok. - Continue hydralazine/isordil.  - Renal function improved and weight stable on lower dose of lasix. Will restart losartan 25 daily. Check labs today.  - Not VAD candidate due to cognitive issues and RV failure. - Will see back in 6-8 weeks with repeat echo.  2. H/o CVA: He is on Plavix, ASA, and statin.    3. Hyperlipidemia: Goal LDL < 70 with CVA history.  Continue statin. 4. Carotid bruit: On left, has history of CVA.  Mild stenosis on carotid dopplers.   5. CKD: Check BMET today.  6. Bioprosthetic aortic valve: There is a question of possible moderate bioprosthetic valve stenosis.  Will need to follow closely, repeat echo in 5/16.   Glori Bickers MD 09/17/2014

## 2014-09-17 NOTE — Patient Instructions (Signed)
Start Losartan 25 mg daily  Lab today  Your physician has requested that you have an echocardiogram. Echocardiography is a painless test that uses sound waves to create images of your heart. It provides your doctor with information about the size and shape of your heart and how well your heart's chambers and valves are working. This procedure takes approximately one hour. There are no restrictions for this procedure.  Your physician recommends that you schedule a follow-up appointment in: 6 weeks

## 2014-09-17 NOTE — Addendum Note (Signed)
Encounter addended by: Scarlette Calico, RN on: 09/17/2014  3:15 PM<BR>     Documentation filed: Dx Association, Patient Instructions Section, Orders

## 2014-09-26 ENCOUNTER — Ambulatory Visit (HOSPITAL_COMMUNITY): Payer: Medicare HMO | Admitting: Hematology & Oncology

## 2014-09-29 ENCOUNTER — Ambulatory Visit (INDEPENDENT_AMBULATORY_CARE_PROVIDER_SITE_OTHER): Payer: Medicare HMO | Admitting: *Deleted

## 2014-09-29 ENCOUNTER — Telehealth: Payer: Self-pay | Admitting: Cardiology

## 2014-09-29 DIAGNOSIS — I429 Cardiomyopathy, unspecified: Secondary | ICD-10-CM

## 2014-09-29 NOTE — Telephone Encounter (Signed)
Spoke with pt and reminded pt of remote transmission that is due today. Pt verbalized understanding.   

## 2014-09-29 NOTE — Progress Notes (Signed)
Remote ICD transmission.   

## 2014-09-30 ENCOUNTER — Encounter: Payer: Self-pay | Admitting: Nurse Practitioner

## 2014-09-30 ENCOUNTER — Ambulatory Visit (INDEPENDENT_AMBULATORY_CARE_PROVIDER_SITE_OTHER): Payer: Medicare HMO | Admitting: Nurse Practitioner

## 2014-09-30 VITALS — BP 97/58 | HR 71 | Temp 97.8°F | Ht 62.0 in | Wt 162.0 lb

## 2014-09-30 DIAGNOSIS — K317 Polyp of stomach and duodenum: Secondary | ICD-10-CM | POA: Diagnosis not present

## 2014-09-30 DIAGNOSIS — K2100 Gastro-esophageal reflux disease with esophagitis, without bleeding: Secondary | ICD-10-CM

## 2014-09-30 DIAGNOSIS — D649 Anemia, unspecified: Secondary | ICD-10-CM | POA: Diagnosis not present

## 2014-09-30 NOTE — Assessment & Plan Note (Signed)
Patient with a history of anemia likely multifactorial. He has chronic kidney disease. However on his endoscopy to the end of 2015 it was noted likely stomach oozing as a contributing factor to his anemia. There are multiple antral gastric polyps, one was removed and found to be hyperplastic as theorized but without malignancy or dysplasia. It was deemed too risky to attempt removal of all of his polyps. Currently he is asymptomatic, denying chest pain, shortness of breath, dizziness, syncope, near syncope, or other red flag or warning signs or symptoms. Continue to follow routinely with his next appointment in August as previously scheduled. Monitor anemia and treat as appropriate.

## 2014-09-30 NOTE — Progress Notes (Signed)
Referring Provider: Sinda Du, MD Primary Care Physician:  [name], MD Primary GI: Dr. Gala Romney  Chief Complaint  Patient presents with  . EGD    HPI:   64 year old male returns for follow-up evaluation per referral from PCP on esophageal erosions noted on endoscopy in 2015. Patient was recently seen 2 months ago. Per endoscopy report reviewed at that time, last endoscopy in 2015 the patient had mild erosive reflux esophagitis and multiple antral polyps. These polyps are likely hyperplastic and some are removed for biopsy. He was noted to have a diffusely abnormal stomach and suspected it was a portion of the patient's anemia due to intermittent oozing of the stomach however it would be difficult and very risky to attempt complete removal of all the gastric polyps. Pathology report on the removed polyps did indeed showed hyperplastic polyps without dysplasia or malignancy.  Today he states he is doing well. Denies epigastric pain, reflux symptoms, bitter taste in the mouth, abdominal pain, dizziness, dizziness, syncope, near syncope, shortness of breath, chest pain. Denies any other upper or lower GI symptoms. Reviewed PCP notes that were sent over with the patient. History documented with the assistance of a group home staff member.  Past Medical History  Diagnosis Date  . Chronic systolic heart failure     NYHA Class III  . Type 2 diabetes mellitus   . Nonischemic cardiomyopathy     EF 15-20%  . Essential hypertension, benign   . CKD (chronic kidney disease) stage 3, GFR 30-59 ml/min   . Anemia   . Hyperlipidemia   . History of stroke   . Bell's palsy   . Cardiomyopathy   . Aortic insufficiency   . Alcoholism in remission   . Noncompliance with medication regimen   . NSVT (nonsustained ventricular tachycardia)   . Cardiac defibrillator in situ   . COPD (chronic obstructive pulmonary disease)   . CVA (cerebral infarction) 01/2013  . Productive cough 08/2013   With brown sputum   . SOB (shortness of breath) on exertion 08/2013  . Numbness of right jaw     Had a stoke in 01/2013. Numbness is occasional, especially when trying to chew.  . At moderate risk for fall   . AICD (automatic cardioverter/defibrillator) present   . Stroke     weakness of right side from CVA  . Arthritis     Past Surgical History  Procedure Laterality Date  . Cardiac defibrillator placement  11/12/12    [employer] Inogen MINI ICD implanted in [place] at [facility]  . Cardiac surgery      bioprostetic AVR per Dr Nelly Laurence note  . Colonoscopy with propofol N/A 04/10/2014    RMR: Colonic Diverticulosis  . Esophagogastroduodenoscopy (egd) with propofol N/A 04/10/2014    RMR: Mild erosive reflux esophagitis. Multiple antral polyps likely hyperplastic status post removal by hot snare cautery technique. Diffusely abnormal stomach status post gastric biopsy I suspect some of patients anemaia may be due to intermittent oozing from the stomach. It would be difficult and a risky proposition to attempt complete removal of all of his gastric polyps.  . Polypectomy N/A 04/10/2014    Procedure: GASTRIC POLYPECTOMY;  Surgeon: Daneil Dolin, MD;  Location: [facility];  Service: Endoscopy;  Laterality: N/A;  . Esophageal biopsy N/A 04/10/2014    Procedure: GASTRIC BIOPSY;  Surgeon: Daneil Dolin, MD;  Location: [facility];  Service: Endoscopy;  Laterality: N/A;  . Right heart catheterization N/A 02/06/2014  Procedure: RIGHT HEART CATH;  Surgeon: Larey Dresser, MD;  Location: [facility] CATH LAB;  Service: Cardiovascular;  Laterality: N/A;    Current Outpatient Prescriptions  Medication Sig Dispense Refill  . aspirin 81 MG EC tablet Take 1 tablet (81 mg total) by mouth daily.    Marland Kitchen atorvastatin (LIPITOR) 80 MG tablet Take 80 mg by mouth daily.    . carvedilol (COREG) 25 MG tablet Take 12.5 mg by mouth 2 (two) times daily with a meal.     . Cholecalciferol (VITAMIN D3) 2000 UNITS  TABS Take 2,000 mg by mouth daily.    Marland Kitchen DIGOX 125 MCG tablet TAKE 1/2 TABLET (0.0625MG ) BY MOUTH ONCE DAILY. 15 tablet 5  . ferrous sulfate 325 (65 FE) MG tablet Take 325 mg by mouth daily with breakfast.    . furosemide (LASIX) 40 MG tablet Take 1 tablet (40 mg total) by mouth daily. 90 tablet 3  . hydrALAZINE (APRESOLINE) 25 MG tablet Take 3 tablets by mouth 3 (three) times daily.    . insulin lispro (HUMALOG) 100 UNIT/ML injection Inject 2-6 Units into the skin 3 (three) times daily with meals.    . isosorbide dinitrate (ISORDIL) 20 MG tablet Take 2 tablets by mouth 3 (three) times daily.    Marland Kitchen losartan (COZAAR) 25 MG tablet Take 1 tablet (25 mg total) by mouth daily. 30 tablet 3  . pantoprazole (PROTONIX) 40 MG tablet Take 1 tablet (40 mg total) by mouth daily. 30 tablet 3  . REA LO 40 40 % CREA   3  . traMADol (ULTRAM) 50 MG tablet 50 mg every 6 (six) hours as needed.      No current facility-administered medications for this visit.    Allergies as of 09/30/2014  . (No Known Allergies)    Family History  Problem Relation Age of Onset  . Kidney disease Mother   . Kidney disease Sister   . Colon cancer Neg Hx     History   Social History  . Marital Status: Single    Spouse Name: N/A  . Number of Children: N/A  . Years of Education: N/A   Social History Main Topics  . Smoking status: Former Smoker    Types: Cigarettes    Quit date: 11/05/1993  . Smokeless tobacco: Never Used  . Alcohol Use: Yes     Comment: former heavy ETOH, none recently  . Drug Use: No     Comment: prior history of cocaine use, smoking   . Sexual Activity: Yes    Birth Control/ Protection: None   Other Topics Concern  . None   Social History Narrative   Recently moved from [place] to be with family in [place]                Review of Systems: General: Negative for anorexia, weight loss, fever, fatigue, weakness. Eyes: Negative for vision changes.  ENT: Negative for hoarseness,  difficulty swallowing. CV: Negative for chest pain, angina, palpitations, peripheral edema.  Respiratory: Negative for dyspnea at rest, cough, sputum, wheezing.  GI: See history of present illness. Derm: Negative for rash or itching.  Neuro: Negative for weakness, seizure, memory loss, confusion.  Psych: Negative for anxiety, depression.  Endo: Negative for unusual weight change.  Heme: Negative for bruising or bleeding. Allergy: Negative for rash or hives.   Physical Exam: BP 97/58 mmHg  Pulse 71  Temp(Src) 97.8 F (36.6 C) (Oral)  Ht 5\' 2"  (1.575 m)  Wt 162 lb (73.483  kg)  BMI 29.62 kg/m2 General:   Alert and oriented. No distress noted. Pleasant and cooperative.  Head:  Normocephalic and atraumatic. Eyes:  Conjuctiva clear without scleral icterus. Lungs:  Clear to auscultation bilaterally. No wheezes, rales, or rhonchi. No distress.  Heart:  S1, S2 present without murmurs, rubs, or gallops. Regular rate and rhythm. Abdomen:  +BS, soft, non-tender and non-distended. No rebound or guarding. No HSM or masses noted. Msk:  Symmetrical without gross deformities. Normal posture. Extremities:  Without edema. Neurologic:  Alert and  oriented. Skin:  Intact without significant lesions or rashes. Psych:  Alert and cooperative. Normal mood and affect.    09/30/2014 10:19 AM

## 2014-09-30 NOTE — Assessment & Plan Note (Signed)
Patient with a history of multiple gastric polyps on last endoscopy at the end of 2015. One of these was biopsied and determined to be hyperplastic without dysplasia or malignancy. It was noted that there were multiple polyps in the antrum of the stomach and was too risky to attempt removal of all of them. Per endoscopy report, likely oozing stomach bleeding as a partial contributor to his anemia. Also with reflux esophagitis, for which he is on a daily PPI. Routine follow-up as scheduled.

## 2014-09-30 NOTE — Assessment & Plan Note (Signed)
Noted mild reflux esophagitis on endoscopy done in 2015, patient is on a daily PPI. He is currently asymptomatic at this point without epigastric pain, reflux symptoms, hematochezia, melena, or any other red flag signs or symptoms. Follow-up routinely as previously scheduled. Monitor anemia and treat as appropriate.

## 2014-09-30 NOTE — Progress Notes (Signed)
CC'ED TO PCP 

## 2014-09-30 NOTE — Patient Instructions (Signed)
1. Notify us if you have any signs or symptoms of a large amount of bleeding from UGI tract. 2. Return for follow-up in August as previously scheduled. 3. Continue daily Protonix (acid blocker) series.

## 2014-10-03 LAB — CUP PACEART REMOTE DEVICE CHECK
Battery Remaining Percentage: 100 %
Brady Statistic RV Percent Paced: 0 %
Date Time Interrogation Session: 20160502181400
HighPow Impedance: 57 Ohm
Lead Channel Impedance Value: 383 Ohm
Lead Channel Pacing Threshold Amplitude: 3 V
Lead Channel Pacing Threshold Pulse Width: 0.3 ms
MDC IDC MSMT BATTERY REMAINING LONGEVITY: 78 mo
MDC IDC SET LEADCHNL RV PACING AMPLITUDE: 3 V
MDC IDC SET LEADCHNL RV PACING PULSEWIDTH: 1.2 ms
MDC IDC SET LEADCHNL RV SENSING SENSITIVITY: 0.6 mV
Pulse Gen Serial Number: 436636
Zone Setting Detection Interval: 300 ms

## 2014-10-06 ENCOUNTER — Ambulatory Visit (HOSPITAL_COMMUNITY)
Admission: RE | Admit: 2014-10-06 | Discharge: 2014-10-06 | Disposition: A | Discharge status: Home / Self Care | Payer: Medicare HMO | Source: Ambulatory Visit | Attending: Internal Medicine | Admitting: Internal Medicine

## 2014-10-06 DIAGNOSIS — I5022 Chronic systolic (congestive) heart failure: Secondary | ICD-10-CM | POA: Diagnosis present

## 2014-10-06 NOTE — Progress Notes (Signed)
  Echocardiogram 2D Echocardiogram has been performed.  Darlina Sicilian M 10/06/2014, 3:39 PM

## 2014-10-07 ENCOUNTER — Other Ambulatory Visit: Payer: Self-pay | Admitting: Cardiology

## 2014-10-09 ENCOUNTER — Encounter: Payer: Self-pay | Admitting: Cardiology

## 2014-10-14 ENCOUNTER — Encounter: Payer: Self-pay | Admitting: Internal Medicine

## 2014-10-21 ENCOUNTER — Ambulatory Visit (HOSPITAL_COMMUNITY): Payer: Medicare HMO | Admitting: Hematology & Oncology

## 2014-10-23 ENCOUNTER — Ambulatory Visit (HOSPITAL_COMMUNITY)
Admission: RE | Admit: 2014-10-23 | Discharge: 2014-10-23 | Disposition: A | Discharge status: Home / Self Care | Payer: Medicare HMO | Source: Ambulatory Visit | Attending: Internal Medicine | Admitting: Internal Medicine

## 2014-10-23 ENCOUNTER — Encounter: Payer: Self-pay | Admitting: Cardiology

## 2014-10-23 VITALS — BP 124/52 | HR 64 | Wt 124.8 lb

## 2014-10-23 DIAGNOSIS — Z794 Long term (current) use of insulin: Secondary | ICD-10-CM | POA: Diagnosis not present

## 2014-10-23 DIAGNOSIS — I6523 Occlusion and stenosis of bilateral carotid arteries: Secondary | ICD-10-CM | POA: Diagnosis not present

## 2014-10-23 DIAGNOSIS — I129 Hypertensive chronic kidney disease with stage 1 through stage 4 chronic kidney disease, or unspecified chronic kidney disease: Secondary | ICD-10-CM | POA: Diagnosis not present

## 2014-10-23 DIAGNOSIS — G4733 Obstructive sleep apnea (adult) (pediatric): Secondary | ICD-10-CM | POA: Diagnosis not present

## 2014-10-23 DIAGNOSIS — Z79899 Other long term (current) drug therapy: Secondary | ICD-10-CM | POA: Diagnosis not present

## 2014-10-23 DIAGNOSIS — G510 Bell's palsy: Secondary | ICD-10-CM | POA: Diagnosis not present

## 2014-10-23 DIAGNOSIS — E119 Type 2 diabetes mellitus without complications: Secondary | ICD-10-CM | POA: Insufficient documentation

## 2014-10-23 DIAGNOSIS — I5022 Chronic systolic (congestive) heart failure: Secondary | ICD-10-CM

## 2014-10-23 DIAGNOSIS — Z954 Presence of other heart-valve replacement: Secondary | ICD-10-CM | POA: Insufficient documentation

## 2014-10-23 DIAGNOSIS — I429 Cardiomyopathy, unspecified: Secondary | ICD-10-CM | POA: Insufficient documentation

## 2014-10-23 DIAGNOSIS — E785 Hyperlipidemia, unspecified: Secondary | ICD-10-CM | POA: Insufficient documentation

## 2014-10-23 DIAGNOSIS — Z7982 Long term (current) use of aspirin: Secondary | ICD-10-CM | POA: Diagnosis not present

## 2014-10-23 DIAGNOSIS — R0989 Other specified symptoms and signs involving the circulatory and respiratory systems: Secondary | ICD-10-CM | POA: Insufficient documentation

## 2014-10-23 DIAGNOSIS — Z8673 Personal history of transient ischemic attack (TIA), and cerebral infarction without residual deficits: Secondary | ICD-10-CM | POA: Diagnosis not present

## 2014-10-23 DIAGNOSIS — N189 Chronic kidney disease, unspecified: Secondary | ICD-10-CM | POA: Insufficient documentation

## 2014-10-23 NOTE — Progress Notes (Signed)
Patient ID: Keith Young, male   DOB: [birth year], 64 y.o.   MRN: [id number] PCP: Dr. Luan Young Primary Cardiology: Dr. Harl Young  64 yo with history of nonischemic cardiomyopathy and aortic valve replacement presents for CHF clinic evaluation.  Patient is followed in [facility] by Dr. Harl Young.  He recently moved to [place] from [place] to live in an adult care home run by his sister and brother-in-law.  Most recent echo in 5/15 showed EF 15-20% with RV dysfunction.  He had left heart cath in [place] in 2014 with no significant CAD.  Cause of cardiomyopathy thought to be h/o ETOH abuse + HTN.  He has a [name].  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  He had a CPX done in 8/15 that showed severe functional limitation, but RHC in 9/15 showed normal filling pressures and preserved cardiac output.   Returns for f/u. He is here with his brother-in-law. Last visit losartan was restarted. Weight at home 123-124 pounds. Denies SOB/PND/Orthopnea. Brother in [place] prepares his medications.   Echo 5/16 EF 35-40% minimal AS. Speckle pattern   CPX 8/15 Resting HR: 73 Peak HR: 95 (60% age predicted max HR) BP rest: 118/76 BP peak: 130/68 Peak VO2: 8.9 (28.6% predicted peak VO2) VE/VCO2 slope: 38.1 OUES: 0.80 Peak RER: 1.14 Ventilatory Threshold: Could not be detected VE/MVV: 23.6% PETCO2 at peak: 32 O2pulse: 6 (50% predicted O2pulse)  RHC 02/06/14 Hemodynamics (mmHg)  RA mean 3  RV 35/7  PA 36/14, mean 23  PCWP mean 7  Oxygen saturations:  PA 66%  AO 95%  Cardiac Output (Fick) 5.77  Cardiac Index (Fick) 3.63   Labs (6/15): K 4.7, creatinine 1.6 Labs (9/15): K 4.3=>4.8, creatinine 1.46 => 1.64, digoxin 1.3 Labs (11/15): digoxin 0.6 Labs (2/16): K 5, creatinine 2.33, BUN 68, hgb 10.4 Labs (3/16): K 3.9, creatinine 1.69 Labs (09/17/2014): K 4.4 Creatinine 1.78   PMH: 1. Nonischemic cardiomyopathy: Echo (5/15) with moderate LV dilation, EF 15-20%, grade II diastolic dysfunction, mild MR, moderate-severe LAE, bioprosthetic aortic valve with mean gradient 10 mmHg, moderate RV dilation with moderate to severely decreased systolic function, moderate TR, PA systolic pressure 52 mmHg (similar to prior echoes from [facility] in [place]).  LHC (6/14 in [place]) showed no significant coronary disease.  History of prior ETOH abuse as well as HTN that may have contributed.  [place].  CPX (8/15): peak VO2 8.9, VE/VCO2 slope 38.1, RER 1.14 (severe functional limitation).  RHC (9/15) with mean RA 3, PA 36/14, mean PCWP 7, CI 3.63.  2. ACEI cough.  3. ETOH abuse history 4. 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 5. ? Prior atrial fibrillation but patient has not been anticoagulated and device interrogation has not shown atrial fibrillation.  6. H/o CVA: Followed by neurology in [place].  7. HTN 8. Type II diabetes 9. Hyperlipidemia 10. Bells palsy 11. OSA: Awaiting CPAP. 12. CKD 13. Carotid stenosis: Carotid dopplers (7/15) with < 50% bilateral ICA stenosis.   SH: Prior heavy ETOH, now none.  Quit smoking about 20 years ago.  Lives in [place] at adult care facility run by sister and brother-in-law.    FH: CKD, no cardiomyopathy that he is aware of.   ROS: All systems reviewed and negative except as per HPI.   Current Outpatient Prescriptions  Medication Sig Dispense Refill  . aspirin 81 MG EC tablet Take 1 tablet (81 mg total) by mouth daily.    Marland Kitchen atorvastatin (LIPITOR) 80 MG tablet Take 80 mg by mouth daily.    . carvedilol (COREG) 25 MG tablet Take 12.5 mg by mouth 2 (two) times daily with a meal.     . Cholecalciferol (VITAMIN D3) 2000 UNITS TABS Take 2,000 mg by mouth daily.    Marland Kitchen DIGOX 125 MCG tablet TAKE 1/2 TABLET  (0.0625MG ) BY MOUTH ONCE DAILY. 15 tablet 5  . ferrous sulfate 325 (65 FE) MG tablet Take 325 mg by mouth daily with breakfast.    . furosemide (LASIX) 40 MG tablet TAKE 1 TABLET BY MOUTH ONCE A DAY. 30 tablet 3  . hydrALAZINE (APRESOLINE) 25 MG tablet Take 3 tablets by mouth 3 (three) times daily.    . insulin lispro (HUMALOG) 100 UNIT/ML injection Inject 2-6 Units into the skin 3 (three) times daily with meals.    . isosorbide dinitrate (ISORDIL) 20 MG tablet Take 2 tablets by mouth 3 (three) times daily.    Marland Kitchen losartan (COZAAR) 25 MG tablet Take 1 tablet (25 mg total) by mouth daily. 30 tablet 3  . pantoprazole (PROTONIX) 40 MG tablet Take 1 tablet (40 mg total) by mouth daily. 30 tablet 3  . REA LO 40 40 % CREA   3  . traMADol (ULTRAM) 50 MG tablet 50 mg every 6 (six) hours as needed.      No current facility-administered medications for this encounter.    BP 124/52 mmHg  Pulse 64  Wt 124 lb 12 oz (56.586 kg)  SpO2 98% General: NAD HEENT: normal  Neck: JVP flat cm, no thyromegaly or thyroid nodule. R carotid bruit Lungs: Clear to auscultation bilaterally with normal respiratory effort. CV: Nondisplaced PMI.  Heart regular S1/S2, no [id number], 2/6 systolic murmur RUSB with clear S2.  No peripheral edema.  Left carotid bruit.  Normal pedal pulses.  Abdomen: Soft, nontender, no hepatosplenomegaly, no distention.  Skin: Intact without lesions or rashes.  Neurologic: Alert and oriented x 3.  Psych: Normal affect. Extremities: No clubbing or cyanosis.   Assessment/Plan:  1. Chronic systolic CHF: Nonischemic cardiomyopathy with biventricular failure, probably due to ETOH abuse (prior) + HTN.  EF 15-20% on last echo with moderate to severe RV dysfunction.  [place]. CPX showed severe functional limitation.  RHC 9/15, however, was well compensated with good cardiac output.   ECHO on 10/06/14 EF improved 35-40% RV normal . Grade I DD Speckle Type Pattern ? Amyloid. Check SPEP UPEP  today.  - Continue current Coreg and digoxin.  Recent digoxin level ok. - Continue hydralazine/isordil.  - At last visit losartan restarted at 25 daily. Will check labs today, if stable in crease losartan at next visit.  - Not VAD candidate due to cognitive issues and RV failure.  2. H/o CVA: He is on Plavix, ASA, and statin.    3. Hyperlipidemia: Goal LDL < 70 with CVA history.  Continue statin. 4. Carotid bruit: On left, has history of CVA.  Mild stenosis on carotid dopplers.   5. CKD: Check BMET today.  6. Bioprosthetic aortic valve: There is a question of possible moderate bioprosthetic valve stenosis.  No significant stenosis noted on ECHO 09/2014  CLEGG,AMY NP-C  10/23/2014   Patient seen and examined with Darrick Grinder, NP. We discussed all aspects of the encounter. I agree with the assessment and plan as stated above.  Doing very well. Volume status looks good. Feels well. Vitals stable. Continue current regimen. Check labs today. If renal function stable can consider increase of losartan at next visit.   Bensimhon, Daniel,MD 3:30 PM

## 2014-10-23 NOTE — Patient Instructions (Signed)
Labs today  Your physician recommends that you schedule a follow-up appointment in: 3 months  

## 2014-10-23 NOTE — Addendum Note (Signed)
Encounter addended by: Scarlette Calico, RN on: 10/23/2014  3:43 PM<BR>     Documentation filed: Dx Association, Patient Instructions Section, Orders

## 2014-10-24 LAB — PROTEIN ELECTROPHORESIS, SERUM
A/G RATIO SPE: 0.7 (ref 0.7–2.0)
ALPHA-2-GLOBULIN: 0.6 g/dL (ref 0.4–1.2)
Albumin ELP: 3 g/dL — ABNORMAL LOW (ref 3.2–5.6)
Alpha-1-Globulin: 0.2 g/dL (ref 0.1–0.4)
Beta Globulin: 0.8 g/dL (ref 0.6–1.3)
Gamma Globulin: 2.8 g/dL — ABNORMAL HIGH (ref 0.5–1.6)
Globulin, Total: 4.4 g/dL (ref 2.0–4.5)
Total Protein ELP: 7.4 g/dL (ref 6.0–8.5)

## 2014-10-24 LAB — PROTEIN ELECTRO, RANDOM URINE
Albumin ELP, Urine: 67.2 %
Alpha-1-Globulin, U: 2.3 %
Alpha-2-Globulin, U: 3.7 %
Beta Globulin, U: 13 %
Gamma Globulin, U: 13.8 %
Total Protein, Urine: 81.2 mg/dL

## 2014-10-28 ENCOUNTER — Other Ambulatory Visit (HOSPITAL_COMMUNITY): Payer: Medicare HMO

## 2014-10-28 ENCOUNTER — Encounter (HOSPITAL_COMMUNITY): Payer: Medicare HMO

## 2014-10-29 ENCOUNTER — Encounter (HOSPITAL_COMMUNITY): Payer: Medicare HMO | Attending: Hematology & Oncology | Admitting: Hematology & Oncology

## 2014-10-29 VITALS — BP 117/55 | HR 62 | Temp 98.3°F | Resp 16 | Ht 65.5 in | Wt 126.8 lb

## 2014-10-29 DIAGNOSIS — D649 Anemia, unspecified: Secondary | ICD-10-CM | POA: Diagnosis present

## 2014-10-29 DIAGNOSIS — N183 Chronic kidney disease, stage 3 (moderate): Secondary | ICD-10-CM

## 2014-10-29 DIAGNOSIS — R935 Abnormal findings on diagnostic imaging of other abdominal regions, including retroperitoneum: Secondary | ICD-10-CM | POA: Diagnosis not present

## 2014-10-29 LAB — CBC WITH DIFFERENTIAL/PLATELET
BASOS ABS: 0 10*3/uL (ref 0.0–0.1)
Basophils Relative: 0 % (ref 0–1)
EOS ABS: 0.1 10*3/uL (ref 0.0–0.7)
EOS PCT: 3 % (ref 0–5)
HCT: 27.8 % — ABNORMAL LOW (ref 39.0–52.0)
HEMOGLOBIN: 8.9 g/dL — AB (ref 13.0–17.0)
LYMPHS PCT: 38 % (ref 12–46)
Lymphs Abs: 0.9 10*3/uL (ref 0.7–4.0)
MCH: 29.3 pg (ref 26.0–34.0)
MCHC: 32 g/dL (ref 30.0–36.0)
MCV: 91.4 fL (ref 78.0–100.0)
Monocytes Absolute: 0.3 10*3/uL (ref 0.1–1.0)
Monocytes Relative: 13 % — ABNORMAL HIGH (ref 3–12)
NEUTROS PCT: 46 % (ref 43–77)
Neutro Abs: 1.1 10*3/uL — ABNORMAL LOW (ref 1.7–7.7)
Platelets: 151 10*3/uL (ref 150–400)
RBC: 3.04 MIL/uL — ABNORMAL LOW (ref 4.22–5.81)
RDW: 12.3 % (ref 11.5–15.5)
WBC: 2.3 10*3/uL — AB (ref 4.0–10.5)

## 2014-10-29 LAB — COMPREHENSIVE METABOLIC PANEL
ALT: 25 U/L (ref 17–63)
AST: 34 U/L (ref 15–41)
Albumin: 3.1 g/dL — ABNORMAL LOW (ref 3.5–5.0)
Alkaline Phosphatase: 72 U/L (ref 38–126)
Anion gap: 5 (ref 5–15)
BUN: 33 mg/dL — ABNORMAL HIGH (ref 6–20)
CO2: 27 mmol/L (ref 22–32)
Calcium: 9.3 mg/dL (ref 8.9–10.3)
Chloride: 104 mmol/L (ref 101–111)
Creatinine, Ser: 1.72 mg/dL — ABNORMAL HIGH (ref 0.61–1.24)
GFR calc Af Amer: 47 mL/min — ABNORMAL LOW (ref >60)
GFR, EST NON AFRICAN AMERICAN: 41 mL/min — AB (ref >60)
Glucose, Bld: 204 mg/dL — ABNORMAL HIGH (ref 65–99)
POTASSIUM: 3.9 mmol/L (ref 3.5–5.1)
Sodium: 136 mmol/L (ref 135–145)
Total Bilirubin: 0.4 mg/dL (ref 0.3–1.2)
Total Protein: 7.9 g/dL (ref 6.5–8.1)

## 2014-10-29 LAB — FOLATE: Folate: 16.5 ng/mL (ref >5.9)

## 2014-10-29 LAB — LACTATE DEHYDROGENASE: LDH: 211 U/L — AB (ref 98–192)

## 2014-10-29 LAB — SEDIMENTATION RATE: SED RATE: 81 mm/h — AB (ref 0–16)

## 2014-10-29 LAB — RETICULOCYTES
RBC.: 3.04 MIL/uL — ABNORMAL LOW (ref 4.22–5.81)
RETIC CT PCT: 0.8 % (ref 0.4–3.1)
Retic Count, Absolute: 24.3 10*3/uL (ref 19.0–186.0)

## 2014-10-29 NOTE — Patient Instructions (Addendum)
Keith Young at [facility] Discharge Instructions  RECOMMENDATIONS MADE BY THE CONSULTANT AND ANY TEST RESULTS WILL BE SENT TO YOUR REFERRING PHYSICIAN.  Exam completed by Dr Whitney Muse. Return in 3 weeks to see the doctor Lab work today Please call the clinic if you have any questions or concerns   Thank you for choosing [place] at [facility] to provide your oncology and hematology care.  To afford each patient quality time with our provider, please arrive at least 15 minutes before your scheduled appointment time.    You need to re-schedule your appointment should you arrive 10 or more minutes late.  We strive to give you quality time with our providers, and arriving late affects you and other patients whose appointments are after yours.  Also, if you no show three or more times for appointments you may be dismissed from the clinic at the providers discretion.     Again, thank you for choosing [facility].  Our hope is that these requests will decrease the amount of time that you wait before being seen by our physicians.       _____________________________________________________________  Should you have questions after your visit to [facility], please contact our office at [phone] between the hours of 8:30 a.m. and 4:30 p.m.  Voicemails left after 4:30 p.m. will not be returned until the following business day.  For prescription refill requests, have your pharmacy contact our office.

## 2014-10-29 NOTE — Progress Notes (Signed)
[place] at [place] NOTE  Patient Care Team: Sinda Du, MD as PCP - General (Pulmonary Disease) Daneil Dolin, MD as Consulting Physician (Gastroenterology)  CHIEF COMPLAINTS/PURPOSE OF CONSULTATION:  Iron deficiency anemia Chronic kidney disease, Stage III History of IV iron replacement History of cocaine abuse Cardiac defibrillator placement, 11/12/2012 Colonoscopy & EGD 04/10/2014 with Dr. Sydell Axon Diverticulosis and diffusely abnormal stomach and mild erosive esophagitis 04/10/2014 Biopsy showing chronic gastritis 04/10/2014  HISTORY OF PRESENTING ILLNESS:  Keith Young 64 y.o. male is here because of anemia. He has been referred by Dr. Theador Hawthorne as his anemia is felt to be out of proportion to his CKD.  He is here with his brother in law who helps take care of him. He lives at [facility] and has been there for a year now. He moved there because "it was getting hard to walk" and "hard to do stuff for himself." There are 5 residents there.  He received 2 iron infusions in 02/2014 here at [facility], he believes his "kidney doctor" ordered them.  Laboratory studies from 08/04/2014 showed a hemoglobin of 8.9, hematocrit of 27.1, normal vitamin B12, normal serum folate, and a serum ferritin of 465 NG/ML. Hemoglobin from 05/06/2014 was at 10.8, hematocrit was 31.8, serum ferritin was 613 NG/ML.  His EGD report was reviewed from 03/2014, Dr.Rourke felt is was possible that the patient may have intermittent "oozing from his stomach" that was contributing to his anemia.   MEDICAL HISTORY:  Past Medical History  Diagnosis Date  . Chronic systolic heart failure     NYHA Class III  . Type 2 diabetes mellitus   . Nonischemic cardiomyopathy     EF 15-20%  . Essential hypertension, benign   . CKD (chronic kidney disease) stage 3, GFR 30-59 ml/min   . Anemia   . Hyperlipidemia   . History of stroke   . Bell's palsy   . Cardiomyopathy   .  Aortic insufficiency   . Alcoholism in remission   . Noncompliance with medication regimen   . NSVT (nonsustained ventricular tachycardia)   . Cardiac defibrillator in situ   . COPD (chronic obstructive pulmonary disease)   . CVA (cerebral infarction) 01/2013  . Productive cough 08/2013    With brown sputum   . SOB (shortness of breath) on exertion 08/2013  . Numbness of right jaw     Had a stoke in 01/2013. Numbness is occasional, especially when trying to chew.  . At moderate risk for fall   . AICD (automatic cardioverter/defibrillator) present   . Stroke     weakness of right side from CVA  . Arthritis     SURGICAL HISTORY: Past Surgical History  Procedure Laterality Date  . Cardiac defibrillator placement  11/12/12    [employer] Inogen MINI ICD implanted in [place] at [facility]  . Cardiac surgery      bioprostetic AVR per Dr Nelly Laurence note  . Colonoscopy with propofol N/A 04/10/2014    RMR: Colonic Diverticulosis  . Esophagogastroduodenoscopy (egd) with propofol N/A 04/10/2014    RMR: Mild erosive reflux esophagitis. Multiple antral polyps likely hyperplastic status post removal by hot snare cautery technique. Diffusely abnormal stomach status post gastric biopsy I suspect some of patients anemaia may be due to intermittent oozing from the stomach. It would be difficult and a risky proposition to attempt complete removal of all of his gastric polyps.  . Polypectomy N/A 04/10/2014    Procedure:  GASTRIC POLYPECTOMY;  Surgeon: Daneil Dolin, MD;  Location: [facility];  Service: Endoscopy;  Laterality: N/A;  . Esophageal biopsy N/A 04/10/2014    Procedure: GASTRIC BIOPSY;  Surgeon: Daneil Dolin, MD;  Location: [facility] ORS;  Service: Endoscopy;  Laterality: N/A;  . Right heart catheterization N/A 02/06/2014    Procedure: RIGHT HEART CATH;  Surgeon: Larey Dresser, MD;  Location: [facility] CATH LAB;  Service: Cardiovascular;  Laterality: N/A;    SOCIAL HISTORY: History    Social History  . Marital Status: Single    Spouse Name: N/A  . Number of Children: N/A  . Years of Education: N/A   Occupational History  . Not on file.   Social History Main Topics  . Smoking status: Former Smoker    Types: Cigarettes    Quit date: 11/05/1993  . Smokeless tobacco: Never Used  . Alcohol Use: Yes     Comment: former heavy ETOH, none recently  . Drug Use: No     Comment: prior history of cocaine use, smoking   . Sexual Activity: Yes    Birth Control/ Protection: None   Other Topics Concern  . Not on file   Social History Narrative   Recently moved from [place] to be with family in [place]              Previous smoker, he smoked for about a year. He drank alcohol but had no problems with it. He worked at a [employer] for 13 years. His hobbies include softball and bowling.  FAMILY HISTORY: Family History  Problem Relation Age of Onset  . Kidney disease Mother   . Kidney disease Sister   . Colon cancer Neg Hx    has no family status information on file.   Had 7 brothers, 8 sisters. He is the youngest. 64 or 54 still living. All the same mother. 1 sister lost to kidney problems 2 sisters lost to breast cancer. Another sister lost to cancer. Mother deceased, from cancer. He is unsure what age or what type of cancer. Brother in law says she was 7 yo when she died and had diverticulitis, was on dialysis, major stomach issue, double mastectomy. Father deceased, he died when he was little so he is unsure when or what he died from.  ALLERGIES:  has No Known Allergies.  MEDICATIONS:  Current Outpatient Prescriptions  Medication Sig Dispense Refill  . aspirin 81 MG EC tablet Take 1 tablet (81 mg total) by mouth daily.    Marland Kitchen atorvastatin (LIPITOR) 80 MG tablet Take 80 mg by mouth daily.    . BD INSULIN SYRINGE ULTRAFINE 31G X 5/16" 0.3 ML MISC     . carvedilol (COREG) 25 MG tablet Take 12.5 mg by mouth 2 (two) times daily with a meal.     .  Cholecalciferol (VITAMIN D3) 2000 UNITS TABS Take 2,000 mg by mouth daily.    Marland Kitchen DIGOX 125 MCG tablet TAKE 1/2 TABLET (0.0625MG) BY MOUTH ONCE DAILY. 15 tablet 5  . ferrous sulfate 325 (65 FE) MG tablet Take 325 mg by mouth daily with breakfast.    . hydrALAZINE (APRESOLINE) 25 MG tablet Take 3 tablets by mouth 3 (three) times daily.    . insulin lispro (HUMALOG) 100 UNIT/ML injection Inject 2-6 Units into the skin 3 (three) times daily with meals.    . isosorbide dinitrate (ISORDIL) 20 MG tablet Take 2 tablets by mouth 3 (three) times daily.    Marland Kitchen losartan (COZAAR) 25 MG  tablet Take 1 tablet (25 mg total) by mouth daily. 30 tablet 3  . NOVOLOG 100 UNIT/ML injection     . pantoprazole (PROTONIX) 40 MG tablet Take 1 tablet (40 mg total) by mouth daily. 30 tablet 3  . REA LO 40 40 % CREA   3  . traMADol (ULTRAM) 50 MG tablet 50 mg every 6 (six) hours as needed.     . furosemide (LASIX) 40 MG tablet TAKE 1 TABLET BY MOUTH ONCE A DAY. 30 tablet 3   No current facility-administered medications for this visit.    Review of Systems  Constitutional: Positive for malaise/fatigue. Negative for fever, chills, weight loss and diaphoresis.  HENT: Positive for hearing loss. Negative for congestion, ear pain, nosebleeds and tinnitus.        Has a hearing aid.  Respiratory: Negative.   Cardiovascular: Negative for chest pain.  Gastrointestinal: Negative for heartburn, nausea, vomiting, diarrhea and constipation.  Genitourinary: Negative for dysuria.       Nocturia, used to be 3 times a night but now only 2 times.  Musculoskeletal: Positive for joint pain.       Right arm pain.  Skin: Negative.   Neurological: Negative.  Negative for seizures, weakness and headaches.  Endo/Heme/Allergies: Negative.   Psychiatric/Behavioral: Negative for depression.   14 point ROS was done and is otherwise as detailed above or in HPI   PHYSICAL EXAMINATION:  ECOG PERFORMANCE STATUS: 1 - Symptomatic but completely  ambulatory  Filed Vitals:   10/29/14 1400  BP: 117/55  Pulse: 62  Temp: 98.3 F (36.8 C)  Resp: 16   Filed Weights   10/29/14 1400  Weight: 126 lb 12.8 oz (57.516 kg)     Physical Exam  Constitutional: He is oriented to person, place, and time and well-developed, well-nourished, and in no distress.  HENT:  Head: Normocephalic and atraumatic.  Nose: Nose normal.  Mouth/Throat: Oropharynx is clear and moist. No oropharyngeal exudate.  Edentulous.   Eyes: Conjunctivae and EOM are normal. Pupils are equal, round, and reactive to light. Right eye exhibits no discharge. Left eye exhibits no discharge. No scleral icterus.  Neck: Normal range of motion. Neck supple. No tracheal deviation present. No thyromegaly present.  Cardiovascular: Normal rate and regular rhythm.  Exam reveals no gallop and no friction rub.   Murmur heard. Systolic ejection murmur. Defibrillator present.  Pulmonary/Chest: Effort normal and breath sounds normal. He has no wheezes. He has no rales.  Occasional course rhonchi.  Abdominal: Soft. Bowel sounds are normal. He exhibits no distension and no mass. There is no tenderness. There is no rebound and no guarding.  Musculoskeletal: Normal range of motion. He exhibits no edema.  Lymphadenopathy:    He has no cervical adenopathy.  Neurological: He is alert and oriented to person, place, and time. He has normal reflexes. No cranial nerve deficit. Gait normal. Coordination normal.  Skin: Skin is warm and dry. No rash noted.  Psychiatric: Mood, memory, affect and judgment normal.  Nursing note and vitals reviewed.   LABORATORY DATA:  I have reviewed the data as listed Lab Results  Component Value Date   WBC 2.3* 10/29/2014   HGB 8.9* 10/29/2014   HCT 27.8* 10/29/2014   MCV 91.4 10/29/2014   PLT 151 10/29/2014    RADIOGRAPHIC STUDIES: I have personally reviewed the radiological images as listed and agreed with the findings in the report.   CLINICAL  DATA: Elevated hepatic function studies ; history of chronic renal insufficiency  EXAM: ULTRASOUND ABDOMEN COMPLETE  COMPARISON: None.  FINDINGS: Gallbladder: The gallbladder is adequately distended and skin pains echogenic sludge and non shadowing but mobile stones. There is no gallbladder wall thickening or pericholecystic fluid. There is no positive sonographic Murphy's sign.  Common bile duct: Diameter: 6 mm  Liver: The liver exhibits no focal mass nor ductal dilation. The echotexture is normal.  IVC: No abnormality visualized.  Pancreas: Visualized portion unremarkable.  Spleen: The spleen is mildly enlarged with a calculated volume of 485 cc  Right Kidney: Length: 10.1 cm. In the midpole of the right kidney there is a simple appearing cyst measuring 2.1 x 1.5 x 2.1 cm. There is no hydronephrosis.  Left Kidney: Length: 11 cm. In the mid to lower pole there is a 1.5 x 1.5 x 1.2 cm diameter simple appearing cyst. There is no hydronephrosis.  Abdominal aorta: No aneurysm visualized.  Other findings: None.  IMPRESSION: 1. Gallstones and sludge without evidence of acute cholecystitis. 2. The liver and pancreas and common bile duct exhibit no acute abnormality. There is mild splenomegaly. 3. The renal cortical echotexture is mildly increased consistent with medical renal disease. There is no hydronephrosis. There are simple appearing cysts in both kidneys.   Electronically Signed  By: David Martinique  On: 05/14/2014 10:20    ASSESSMENT & PLAN:  Anemia, normochromic, normocytic Stage III CKD Grossly abnormal stomach on EGD, gastric polyps History of IV Iron replacement  I discussed the patient's anemia with him in detail. I discussed his nephrologist's concern. I advised him that his CKD is contributing to his anemia but does not explain the severity of it.  I recommended proceeding with a basic peripheral anemia evaluation today. Whether or  not to proceed with a bone marrow biopsy will be determined once we have all of the results from today available for review.  He has a significant drop in his H/H from December until March which may suggest potential GI related blood loss. He has had a recent EGD and Colonoscopy in November of last year.  The patient states he is willing to proceed with evaluation as deemed necessary. We will therefore obtain laboratory studies today and see him back in 2-3 weeks to review.  Orders Placed This Encounter  Procedures  . CBC with Differential  . Comprehensive metabolic panel  . Haptoglobin  . Folate  . Ferritin  . Vitamin B12  . Iron and TIBC  . Reticulocytes  . Erythropoietin  . Sedimentation rate  . Lactate dehydrogenase  . Testosterone, % free  . Testosterone    All questions were answered. The patient knows to call the clinic with any problems, questions or concerns.  This note was electronically signed.    This document serves as a record of services personally performed by Ancil Linsey, MD. It was created on her behalf by Arlyce Harman, a trained medical scribe. The creation of this record is based on the scribe's personal observations and the provider's statements to them. This document has been checked and approved by the attending provider.  I have reviewed the above documentation for accuracy and completeness, and I agree with the above.  Molli Hazard, MD

## 2014-10-29 NOTE — Progress Notes (Signed)
Keith Young's reason for visit today is for labs as scheduled per MD orders.  Venipuncture performed with a 23 gauge butterfly needle to right arm and left AC.  Keith Young tolerated procedure well and without incident; questions were answered and patient was discharged.

## 2014-10-30 ENCOUNTER — Other Ambulatory Visit: Payer: Self-pay | Admitting: Cardiology

## 2014-10-30 LAB — TESTOSTERONE: Testosterone: 467 ng/dL (ref 348–1197)

## 2014-10-30 LAB — ERYTHROPOIETIN: Erythropoietin: 10.6 m[IU]/mL (ref 2.6–18.5)

## 2014-10-30 LAB — HAPTOGLOBIN: HAPTOGLOBIN: 103 mg/dL (ref 34–200)

## 2014-10-31 LAB — VITAMIN B12: VITAMIN B 12: 388 pg/mL (ref 180–914)

## 2014-10-31 LAB — IRON AND TIBC
IRON: 60 ug/dL (ref 45–182)
SATURATION RATIOS: 30 % (ref 17.9–39.5)
TIBC: 197 ug/dL — AB (ref 250–450)
UIBC: 137 ug/dL

## 2014-10-31 LAB — FERRITIN: FERRITIN: 312 ng/mL (ref 24–336)

## 2014-11-04 ENCOUNTER — Encounter (HOSPITAL_COMMUNITY): Payer: Self-pay | Admitting: Hematology & Oncology

## 2014-11-04 LAB — TESTOSTERONE, % FREE: Testosterone-% Free: 0.9 % — ABNORMAL LOW (ref 0.2–0.7)

## 2014-11-20 ENCOUNTER — Encounter (HOSPITAL_BASED_OUTPATIENT_CLINIC_OR_DEPARTMENT_OTHER): Payer: Medicare HMO | Admitting: Hematology & Oncology

## 2014-11-20 ENCOUNTER — Encounter (HOSPITAL_COMMUNITY): Payer: Self-pay | Admitting: Hematology & Oncology

## 2014-11-20 VITALS — BP 96/57 | HR 60 | Temp 98.0°F | Resp 16 | Wt 125.3 lb

## 2014-11-20 DIAGNOSIS — N183 Chronic kidney disease, stage 3 (moderate): Secondary | ICD-10-CM | POA: Diagnosis not present

## 2014-11-20 DIAGNOSIS — R700 Elevated erythrocyte sedimentation rate: Secondary | ICD-10-CM | POA: Diagnosis not present

## 2014-11-20 DIAGNOSIS — D649 Anemia, unspecified: Secondary | ICD-10-CM

## 2014-11-20 DIAGNOSIS — D500 Iron deficiency anemia secondary to blood loss (chronic): Secondary | ICD-10-CM

## 2014-11-20 DIAGNOSIS — D131 Benign neoplasm of stomach: Secondary | ICD-10-CM

## 2014-11-20 NOTE — Patient Instructions (Signed)
[place] at [facility] Discharge Instructions  RECOMMENDATIONS MADE BY THE CONSULTANT AND ANY TEST RESULTS WILL BE SENT TO YOUR REFERRING PHYSICIAN.  Bone marrow biopsy set up Return as scheduled Please call the clinic if you have any questions or concerns  Thank you for choosing [place] at [facility] to provide your oncology and hematology care.  To afford each patient quality time with our provider, please arrive at least 15 minutes before your scheduled appointment time.    You need to re-schedule your appointment should you arrive 10 or more minutes late.  We strive to give you quality time with our providers, and arriving late affects you and other patients whose appointments are after yours.  Also, if you no show three or more times for appointments you may be dismissed from the clinic at the providers discretion.     Again, thank you for choosing [facility].  Our hope is that these requests will decrease the amount of time that you wait before being seen by our physicians.       _____________________________________________________________  Should you have questions after your visit to [facility], please contact our office at [phone] between the hours of 8:30 a.m. and 4:30 p.m.  Voicemails left after 4:30 p.m. will not be returned until the following business day.  For prescription refill requests, have your pharmacy contact our office.

## 2014-11-20 NOTE — Progress Notes (Signed)
[facility] NOTE  Patient Care Team: Sinda Du, MD as PCP - General (Pulmonary Disease) Daneil Dolin, MD as Consulting Physician (Gastroenterology)  CHIEF COMPLAINTS/PURPOSE OF CONSULTATION:  Iron deficiency anemia Chronic kidney disease, Stage III History of IV iron replacement History of cocaine abuse Cardiac defibrillator placement, 11/12/2012 Colonoscopy & EGD 04/10/2014 with Dr. Sydell Axon Diverticulosis and diffusely abnormal stomach and mild erosive esophagitis 04/10/2014 Biopsy showing chronic gastritis 04/10/2014 Elevated ESR at 81 mm/hr  HISTORY OF PRESENTING ILLNESS:  Keith Young 64 y.o. male is here because of anemia. He has been referred by Dr. Theador Hawthorne as his anemia is felt to be out of proportion to his CKD.  He is here with his brother in law who helps take care of him. He lives at [facility] and has been there for a year now. He moved there because "it was getting hard to walk" and "hard to do stuff for himself." There are 5 residents there.  He received 2 iron infusions in 02/2014 here at [facility], he believes his "kidney doctor" ordered them.  Laboratory studies from 08/04/2014 showed a hemoglobin of 8.9, hematocrit of 27.1, normal vitamin B12, normal serum folate, and a serum ferritin of 465 NG/ML. Hemoglobin from 05/06/2014 was at 10.8, hematocrit was 31.8, serum ferritin was 613 NG/ML.  His EGD report was reviewed from 03/2014, Dr.Rourke felt is was possible that the patient may have intermittent "oozing from his stomach" that was contributing to his anemia. //  He is here today to review laboratory studies obtained at his last visit. He is also here to discuss additional recommendations in regards to his anemia.  MEDICAL HISTORY:  Past Medical History  Diagnosis Date  . Chronic systolic heart failure     NYHA Class III  . Type 2 diabetes mellitus   . Nonischemic cardiomyopathy     EF 15-20%  .  Essential hypertension, benign   . CKD (chronic kidney disease) stage 3, GFR 30-59 ml/min   . Anemia   . Hyperlipidemia   . History of stroke   . Bell's palsy   . Cardiomyopathy   . Aortic insufficiency   . Alcoholism in remission   . Noncompliance with medication regimen   . NSVT (nonsustained ventricular tachycardia)   . Cardiac defibrillator in situ   . COPD (chronic obstructive pulmonary disease)   . CVA (cerebral infarction) 01/2013  . Productive cough 08/2013    With brown sputum   . SOB (shortness of breath) on exertion 08/2013  . Numbness of right jaw     Had a stoke in 01/2013. Numbness is occasional, especially when trying to chew.  . At moderate risk for fall   . AICD (automatic cardioverter/defibrillator) present   . Stroke     weakness of right side from CVA  . Arthritis     SURGICAL HISTORY: Past Surgical History  Procedure Laterality Date  . Cardiac defibrillator placement  11/12/12    [employer] Inogen MINI ICD implanted in [place] at [facility]  . Cardiac surgery      bioprostetic AVR per Dr Nelly Laurence note  . Colonoscopy with propofol N/A 04/10/2014    RMR: Colonic Diverticulosis  . Esophagogastroduodenoscopy (egd) with propofol N/A 04/10/2014    RMR: Mild erosive reflux esophagitis. Multiple antral polyps likely hyperplastic status post removal by hot snare cautery technique. Diffusely abnormal stomach status post gastric biopsy I suspect some of patients anemaia may be due to  intermittent oozing from the stomach. It would be difficult and a risky proposition to attempt complete removal of all of his gastric polyps.  . Polypectomy N/A 04/10/2014    Procedure: GASTRIC POLYPECTOMY;  Surgeon: Daneil Dolin, MD;  Location: [facility];  Service: Endoscopy;  Laterality: N/A;  . Esophageal biopsy N/A 04/10/2014    Procedure: GASTRIC BIOPSY;  Surgeon: Daneil Dolin, MD;  Location: [facility];  Service: Endoscopy;  Laterality: N/A;  . Right heart  catheterization N/A 02/06/2014    Procedure: RIGHT HEART CATH;  Surgeon: Larey Dresser, MD;  Location: [facility] CATH LAB;  Service: Cardiovascular;  Laterality: N/A;    SOCIAL HISTORY: History   Social History  . Marital Status: Single    Spouse Name: N/A  . Number of Children: N/A  . Years of Education: N/A   Occupational History  . Not on file.   Social History Main Topics  . Smoking status: Former Smoker    Types: Cigarettes    Quit date: 11/05/1993  . Smokeless tobacco: Never Used  . Alcohol Use: Yes     Comment: former heavy ETOH, none recently  . Drug Use: No     Comment: prior history of cocaine use, smoking   . Sexual Activity: Yes    Birth Control/ Protection: None   Other Topics Concern  . Not on file   Social History Narrative   Recently moved from [place] to be with family in [place]              Previous smoker, he smoked for about a year. He drank alcohol but had no problems with it. He worked at a [employer] for 13 years. His hobbies include softball and bowling.  FAMILY HISTORY: Family History  Problem Relation Age of Onset  . Kidney disease Mother   . Kidney disease Sister   . Colon cancer Neg Hx    has no family status information on file.   Had 7 brothers, 8 sisters. He is the youngest. 31 or 34 still living. All the same mother. 1 sister lost to kidney problems 2 sisters lost to breast cancer. Another sister lost to cancer. Mother deceased, from cancer. He is unsure what age or what type of cancer. Brother in law says she was 51 yo when she died and had diverticulitis, was on dialysis, major stomach issue, double mastectomy. Father deceased, he died when he was little so he is unsure when or what he died from.  ALLERGIES:  has No Known Allergies.  MEDICATIONS:  Current Outpatient Prescriptions  Medication Sig Dispense Refill  . aspirin 81 MG EC tablet Take 1 tablet (81 mg total) by mouth daily.    Marland Kitchen atorvastatin (LIPITOR) 80 MG tablet Take  80 mg by mouth daily.    . BD INSULIN SYRINGE ULTRAFINE 31G X 5/16" 0.3 ML MISC     . carvedilol (COREG) 25 MG tablet Take 12.5 mg by mouth 2 (two) times daily with a meal.     . Cholecalciferol (VITAMIN D3) 2000 UNITS TABS Take 2,000 mg by mouth daily.    Marland Kitchen DIGOX 125 MCG tablet TAKE 1/2 TABLET (0.0625MG) BY MOUTH ONCE DAILY. 15 tablet 5  . ferrous sulfate 325 (65 FE) MG tablet Take 325 mg by mouth daily with breakfast.    . furosemide (LASIX) 40 MG tablet TAKE 1 TABLET BY MOUTH ONCE A DAY. 30 tablet 3  . hydrALAZINE (APRESOLINE) 25 MG tablet Take 3 tablets by mouth 3 (three) times  daily.    . insulin lispro (HUMALOG) 100 UNIT/ML injection Inject 2-6 Units into the skin 3 (three) times daily with meals.    . isosorbide dinitrate (ISORDIL) 20 MG tablet Take 2 tablets by mouth 3 (three) times daily.    . pantoprazole (PROTONIX) 40 MG tablet Take 1 tablet (40 mg total) by mouth daily. 30 tablet 3  . REA LO 40 40 % CREA   3  . traMADol (ULTRAM) 50 MG tablet 50 mg every 6 (six) hours as needed.     Marland Kitchen losartan (COZAAR) 25 MG tablet TAKE 1 TABLET BY MOUTH ONCE A DAY. 30 tablet 3  . NOVOLOG 100 UNIT/ML injection      No current facility-administered medications for this visit.    Review of Systems  Constitutional: Positive for malaise/fatigue. Negative for fever, chills, weight loss and diaphoresis.  HENT: Positive for hearing loss. Negative for congestion, ear pain, nosebleeds and tinnitus.        Has a hearing aid.  Respiratory: Negative.   Cardiovascular: Negative for chest pain.  Gastrointestinal: Negative for heartburn, nausea, vomiting, diarrhea and constipation.  Genitourinary: Negative for dysuria.       Nocturia, used to be 3 times a night but now only 2 times.  Musculoskeletal: Positive for joint pain.       Right arm pain.  Skin: Negative.   Neurological: Negative.  Negative for seizures, weakness and headaches.  Endo/Heme/Allergies: Negative.   Psychiatric/Behavioral: Negative  for depression.   14 point ROS was done and is otherwise as detailed above or in HPI  PHYSICAL EXAMINATION:  ECOG PERFORMANCE STATUS: 1 - Symptomatic but completely ambulatory  Filed Vitals:   11/20/14 1116  BP: 96/57  Pulse: 60  Temp: 98 F (36.7 C)  Resp: 16   Filed Weights   11/20/14 1116  Weight: 125 lb 4.8 oz (56.836 kg)     Physical Exam  Constitutional: He is oriented to person, place, and time and well-developed, well-nourished, and in no distress.  HENT:  Head: Normocephalic and atraumatic.  Nose: Nose normal.  Mouth/Throat: Oropharynx is clear and moist. No oropharyngeal exudate.  Edentulous.   Eyes: Conjunctivae and EOM are normal. Pupils are equal, round, and reactive to light. Right eye exhibits no discharge. Left eye exhibits no discharge. No scleral icterus.  Neck: Normal range of motion. Neck supple. No tracheal deviation present. No thyromegaly present.  Cardiovascular: Normal rate and regular rhythm.  Exam reveals no gallop and no friction rub.   Murmur heard. Systolic ejection murmur. Defibrillator present.  Pulmonary/Chest: Effort normal and breath sounds normal. He has no wheezes. He has no rales.  Occasional course rhonchi.  Abdominal: Soft. Bowel sounds are normal. He exhibits no distension and no mass. There is no tenderness. There is no rebound and no guarding.  Musculoskeletal: Normal range of motion. He exhibits no edema.  Lymphadenopathy:    He has no cervical adenopathy.  Neurological: He is alert and oriented to person, place, and time. He has normal reflexes. No cranial nerve deficit. Gait normal. Coordination normal.  Skin: Skin is warm and dry. No rash noted.  Psychiatric: Mood, memory, affect and judgment normal.  Nursing note and vitals reviewed.   LABORATORY DATA:  I have reviewed the data as listed    Results for Keith, Young (MRN [id number]) as of 12/13/2014 09:23  Ref. Range 10/23/2014 15:45 10/29/2014 16:10  Comment Unknown  Comment   Sodium Latest Ref Range: 135-145 mmol/L  136  Potassium  Latest Ref Range: 3.5-5.1 mmol/L  3.9  Chloride Latest Ref Range: 101-111 mmol/L  104  CO2 Latest Ref Range: 22-32 mmol/L  27  BUN Latest Ref Range: 6-20 mg/dL  33 (H)  Creatinine Latest Ref Range: 0.61-1.24 mg/dL  1.72 (H)  Calcium Latest Ref Range: 8.9-10.3 mg/dL  9.3  EGFR (Non-African Amer.) Latest Ref Range: >60 mL/min  41 (L)  EGFR (African American) Latest Ref Range: >60 mL/min  47 (L)  Glucose Latest Ref Range: 65-99 mg/dL  204 (H)  Anion gap Latest Ref Range: 5-15   5  Alkaline Phosphatase Latest Ref Range: 38-126 U/L  72  Albumin Latest Ref Range: 3.5-5.0 g/dL  3.1 (L)  Albumin/Globulin Ratio Latest Ref Range: 0.7-2.0  0.7   AST Latest Ref Range: 15-41 U/L  34  ALT Latest Ref Range: 17-63 U/L  25  Total Protein Latest Ref Range: 6.5-8.1 g/dL  7.9  Total Bilirubin Latest Ref Range: 0.3-1.2 mg/dL  0.4  LDH Latest Ref Range: 98-192 U/L  211 (H)  Iron Latest Ref Range: 45-182 ug/dL  60  UIBC Latest Units: ug/dL  137  TIBC Latest Ref Range: 250-450 ug/dL  197 (L)  Saturation Ratios Latest Ref Range: 17.9-39.5 %  30  Ferritin Latest Ref Range: 24-336 ng/mL  312  Folate Latest Ref Range: >5.9 ng/mL  16.5  Vitamin B-12 Latest Ref Range: 180-914 pg/mL  388  Total Protein ELP Latest Ref Range: 6.0-8.5 g/dL 7.4   Albumin ELP Latest Ref Range: 3.2-5.6 g/dL 3.0 (L)   Globulin, Total Latest Ref Range: 2.0-4.5 g/dL 4.4   Alpha-1-Globulin Latest Ref Range: 0.1-0.4 g/dL 0.2   Alpha-2-Globulin Latest Ref Range: 0.4-1.2 g/dL 0.6   Beta Globulin Latest Ref Range: 0.6-1.3 g/dL 0.8   Gamma Globulin Latest Ref Range: 0.5-1.6 g/dL 2.8 (H)   M-SPIKE, % Latest Ref Range: Not Observed g/dL Not Observed   SPE Interp. Unknown Comment   WBC Latest Ref Range: 4.0-10.5 K/uL  2.3 (L)  RBC Latest Ref Range: 4.22-5.81 MIL/uL  3.04 (L)  Hemoglobin Latest Ref Range: 13.0-17.0 g/dL  8.9 (L)  HCT Latest Ref Range: 39.0-52.0 %  27.8 (L)  MCV  Latest Ref Range: 78.0-100.0 fL  91.4  MCH Latest Ref Range: 26.0-34.0 pg  29.3  MCHC Latest Ref Range: 30.0-36.0 g/dL  32.0  RDW Latest Ref Range: 11.5-15.5 %  12.3  Platelets Latest Ref Range: 150-400 K/uL  151  Neutrophils Latest Ref Range: 43-77 %  46  Lymphocytes Latest Ref Range: 12-46 %  38  Monocytes Relative Latest Ref Range: 3-12 %  13 (H)  Eosinophil Latest Ref Range: 0-5 %  3  Basophil Latest Ref Range: 0-1 %  0  NEUT# Latest Ref Range: 1.7-7.7 K/uL  1.1 (L)  Lymphocyte # Latest Ref Range: 0.7-4.0 K/uL  0.9  Monocyte # Latest Ref Range: 0.1-1.0 K/uL  0.3  Eosinophils Absolute Latest Ref Range: 0.0-0.7 K/uL  0.1  Basophils Absolute Latest Ref Range: 0.0-0.1 K/uL  0.0  RBC. Latest Ref Range: 4.22-5.81 MIL/uL  3.04 (L)  Retic Ct Pct Latest Ref Range: 0.4-3.1 %  0.8  Retic Count, Manual Latest Ref Range: 19.0-186.0 K/uL  24.3  Haptoglobin Latest Ref Range: 34-200 mg/dL  103  Erythropoietin Latest Ref Range: 2.6-18.5 mIU/mL  10.6  Sed Rate Latest Ref Range: 0-16 mm/hr  81 (H)  Testosterone Latest Ref Range: [phone] ng/dL  467  Please Note (HCV): Unknown Comment   Albumin ELP, Urine Latest Units: % 67.2   Total  Protein, Urine-UPE24 Latest Ref Range: Not Estab. mg/dL 81.2   Alpha-1-Globulin, U Latest Units: % 2.3   ALPHA-2-GLOBULIN, U Latest Units: % 3.7   Beta Globulin, U Latest Units: % 13.0   Gamma Globulin, U Latest Units: % 13.8   M Component, Ur Latest Ref Range: Not Observed % Not Observed   Comment, Testosterone Unknown  Comment  Testosterone-% Free Latest Ref Range: 0.2-0.7 %  0.9 (L)        RADIOGRAPHIC STUDIES: I have personally reviewed the radiological images as listed and agreed with the findings in the report.   CLINICAL DATA: Elevated hepatic function studies ; history of chronic renal insufficiency  EXAM: ULTRASOUND ABDOMEN COMPLETE  COMPARISON: None.  FINDINGS: Gallbladder: The gallbladder is adequately distended and skin  pains echogenic sludge and non shadowing but mobile stones. There is no gallbladder wall thickening or pericholecystic fluid. There is no positive sonographic Murphy's sign.  Common bile duct: Diameter: 6 mm  Liver: The liver exhibits no focal mass nor ductal dilation. The echotexture is normal.  IVC: No abnormality visualized.  Pancreas: Visualized portion unremarkable.  Spleen: The spleen is mildly enlarged with a calculated volume of 485 cc  Right Kidney: Length: 10.1 cm. In the midpole of the right kidney there is a simple appearing cyst measuring 2.1 x 1.5 x 2.1 cm. There is no hydronephrosis.  Left Kidney: Length: 11 cm. In the mid to lower pole there is a 1.5 x 1.5 x 1.2 cm diameter simple appearing cyst. There is no hydronephrosis.  Abdominal aorta: No aneurysm visualized.  Other findings: None.  IMPRESSION: 1. Gallstones and sludge without evidence of acute cholecystitis. 2. The liver and pancreas and common bile duct exhibit no acute abnormality. There is mild splenomegaly. 3. The renal cortical echotexture is mildly increased consistent with medical renal disease. There is no hydronephrosis. There are simple appearing cysts in both kidneys.   Electronically Signed  By: David Martinique  On: 05/14/2014 10:20    ASSESSMENT & PLAN:  Anemia, normochromic, normocytic Stage III CKD Grossly abnormal stomach on EGD, gastric polyps History of IV Iron replacement Elevated ESR at 81 mm/hr  I discussed the patient's anemia with him in detail. I discussed his nephrologist's concern. I advised him that his CKD is contributing to his anemia but does not explain the severity of it.   He has a significant drop in his H/H from December until March which may suggest potential GI related blood loss. He has had a recent EGD and Colonoscopy in November of last year. He will need iron replacement prn. Iron studies obtained on 6/1 were WNL.  The patient states he  is willing to proceed with evaluation as deemed necessary. I have recommended a bone marrow biopsy for additional evaluation of his anemia. I do believe his anemia is multifactorial related to his chronic kidney disease, potential GI related blood loss but remainder of his peripheral evaluation was relatively unremarkable. Note that he did have an elevated sedimentation rate. Bone marrow biopsy will be valuable in ruling out any contribution from bone marrow dysfunction.  All questions were answered. The patient knows to call the clinic with any problems, questions or concerns.   This note was electronically signed.    This document serves as a record of services personally performed by Ancil Linsey, MD. It was created on her behalf by Arlyce Harman, a trained medical scribe. The creation of this record is based on the scribe's personal observations and the provider's statements  to them. This document has been checked and approved by the attending provider.  I have reviewed the above documentation for accuracy and completeness, and I agree with the above.  Kelby Fam. Whitney Muse, MD

## 2014-11-28 ENCOUNTER — Encounter: Payer: Self-pay | Admitting: *Deleted

## 2014-12-02 ENCOUNTER — Other Ambulatory Visit (HOSPITAL_COMMUNITY): Payer: Self-pay | Admitting: Internal Medicine

## 2014-12-03 ENCOUNTER — Encounter: Payer: Self-pay | Admitting: Internal Medicine

## 2014-12-13 ENCOUNTER — Encounter (HOSPITAL_COMMUNITY): Payer: Self-pay | Admitting: Hematology & Oncology

## 2014-12-15 ENCOUNTER — Encounter (HOSPITAL_COMMUNITY): Payer: Medicare HMO | Attending: Hematology & Oncology | Admitting: Hematology & Oncology

## 2014-12-15 ENCOUNTER — Encounter (HOSPITAL_COMMUNITY): Payer: Self-pay | Admitting: Hematology & Oncology

## 2014-12-15 VITALS — BP 88/50 | HR 59 | Temp 97.8°F | Resp 16 | Wt 124.3 lb

## 2014-12-15 DIAGNOSIS — D649 Anemia, unspecified: Secondary | ICD-10-CM | POA: Diagnosis not present

## 2014-12-15 DIAGNOSIS — R7989 Other specified abnormal findings of blood chemistry: Secondary | ICD-10-CM | POA: Diagnosis not present

## 2014-12-15 DIAGNOSIS — N183 Chronic kidney disease, stage 3 (moderate): Secondary | ICD-10-CM

## 2014-12-15 DIAGNOSIS — D631 Anemia in chronic kidney disease: Secondary | ICD-10-CM

## 2014-12-15 DIAGNOSIS — N189 Chronic kidney disease, unspecified: Secondary | ICD-10-CM

## 2014-12-15 DIAGNOSIS — D131 Benign neoplasm of stomach: Secondary | ICD-10-CM | POA: Diagnosis not present

## 2014-12-15 DIAGNOSIS — Z803 Family history of malignant neoplasm of breast: Secondary | ICD-10-CM

## 2014-12-15 LAB — CBC WITH DIFFERENTIAL/PLATELET
Basophils Absolute: 0 10*3/uL (ref 0.0–0.1)
Basophils Relative: 1 % (ref 0–1)
EOS ABS: 0.1 10*3/uL (ref 0.0–0.7)
EOS PCT: 4 % (ref 0–5)
HCT: 30 % — ABNORMAL LOW (ref 39.0–52.0)
HEMOGLOBIN: 9.9 g/dL — AB (ref 13.0–17.0)
Lymphocytes Relative: 41 % (ref 12–46)
Lymphs Abs: 1 10*3/uL (ref 0.7–4.0)
MCH: 29.1 pg (ref 26.0–34.0)
MCHC: 33 g/dL (ref 30.0–36.0)
MCV: 88.2 fL (ref 78.0–100.0)
MONO ABS: 0.3 10*3/uL (ref 0.1–1.0)
Monocytes Relative: 13 % — ABNORMAL HIGH (ref 3–12)
NEUTROS ABS: 1.1 10*3/uL — AB (ref 1.7–7.7)
NEUTROS PCT: 41 % — AB (ref 43–77)
PLATELETS: 143 10*3/uL — AB (ref 150–400)
RBC: 3.4 MIL/uL — AB (ref 4.22–5.81)
RDW: 12.5 % (ref 11.5–15.5)
WBC: 2.5 10*3/uL — ABNORMAL LOW (ref 4.0–10.5)

## 2014-12-15 LAB — BONE MARROW EXAM

## 2014-12-15 NOTE — Progress Notes (Signed)
[name] NOTE  Patient Care Team: Sinda Du, MD as PCP - General (Pulmonary Disease) Daneil Dolin, MD as Consulting Physician (Gastroenterology)  CHIEF COMPLAINTS/PURPOSE OF CONSULTATION:  Iron deficiency anemia Chronic kidney disease, Stage III History of IV iron replacement History of cocaine abuse Cardiac defibrillator placement, 11/12/2012 Colonoscopy & EGD 04/10/2014 with Dr. Sydell Axon Diverticulosis and diffusely abnormal stomach and mild erosive esophagitis 04/10/2014 Biopsy showing chronic gastritis 04/10/2014 Elevated ESR at 81 mm/hr  HISTORY OF PRESENTING ILLNESS:  Keith Young 64 y.o. male is here because of anemia. He has been referred by Dr. Theador Hawthorne as his anemia is felt to be out of proportion to his CKD.  He lives at [facility] group home and has been there for a year now. He moved there because "it was getting hard to walk" and "hard to do stuff for himself." There are 5 residents there.  He received 2 iron infusions in 02/2014 here at [facility], he believes his "kidney doctor" ordered them.  Laboratory studies from 08/04/2014 showed a hemoglobin of 8.9, hematocrit of 27.1, normal vitamin B12, normal serum folate, and a serum ferritin of 465 NG/ML. Hemoglobin from 05/06/2014 was at 10.8, hematocrit was 31.8, serum ferritin was 613 NG/ML.  His EGD report was reviewed from 03/2014, Dr.Rourke felt is was possible that the patient may have intermittent "oozing from his stomach" that was contributing to his anemia.  The patient presents today for a bone marrow biopsy.  MEDICAL HISTORY:  Past Medical History  Diagnosis Date  . Chronic systolic heart failure     NYHA Class III  . Type 2 diabetes mellitus   . Nonischemic cardiomyopathy     EF 15-20%  . Essential hypertension, benign   . CKD (chronic kidney disease) stage 3, GFR 30-59 ml/min   . Anemia   . Hyperlipidemia   . History of stroke   . Bell's palsy   .  Cardiomyopathy   . Aortic insufficiency   . Alcoholism in remission   . Noncompliance with medication regimen   . NSVT (nonsustained ventricular tachycardia)   . Cardiac defibrillator in situ   . COPD (chronic obstructive pulmonary disease)   . CVA (cerebral infarction) 01/2013  . Productive cough 08/2013    With brown sputum   . SOB (shortness of breath) on exertion 08/2013  . Numbness of right jaw     Had a stoke in 01/2013. Numbness is occasional, especially when trying to chew.  . At moderate risk for fall   . AICD (automatic cardioverter/defibrillator) present   . Stroke     weakness of right side from CVA  . Arthritis     SURGICAL HISTORY: Past Surgical History  Procedure Laterality Date  . Cardiac defibrillator placement  11/12/12    [employer] Inogen MINI ICD implanted in [place] at [facility]  . Cardiac surgery      bioprostetic AVR per Dr Nelly Laurence note  . Colonoscopy with propofol N/A 04/10/2014    RMR: Colonic Diverticulosis  . Esophagogastroduodenoscopy (egd) with propofol N/A 04/10/2014    RMR: Mild erosive reflux esophagitis. Multiple antral polyps likely hyperplastic status post removal by hot snare cautery technique. Diffusely abnormal stomach status post gastric biopsy I suspect some of patients anemaia may be due to intermittent oozing from the stomach. It would be difficult and a risky proposition to attempt complete removal of all of his gastric polyps.  . Polypectomy N/A 04/10/2014    Procedure:  GASTRIC POLYPECTOMY;  Surgeon: Daneil Dolin, MD;  Location: [facility];  Service: Endoscopy;  Laterality: N/A;  . Esophageal biopsy N/A 04/10/2014    Procedure: GASTRIC BIOPSY;  Surgeon: Daneil Dolin, MD;  Location: [facility];  Service: Endoscopy;  Laterality: N/A;  . Right heart catheterization N/A 02/06/2014    Procedure: RIGHT HEART CATH;  Surgeon: Larey Dresser, MD;  Location: [facility] CATH LAB;  Service: Cardiovascular;  Laterality: N/A;    SOCIAL  HISTORY: History   Social History  . Marital Status: Single    Spouse Name: N/A  . Number of Children: N/A  . Years of Education: N/A   Occupational History  . Not on file.   Social History Main Topics  . Smoking status: Former Smoker    Types: Cigarettes    Quit date: 11/05/1993  . Smokeless tobacco: Never Used  . Alcohol Use: Yes     Comment: former heavy ETOH, none recently  . Drug Use: No     Comment: prior history of cocaine use, smoking   . Sexual Activity: Yes    Birth Control/ Protection: None   Other Topics Concern  . Not on file   Social History Narrative   Recently moved from [place] to be with family in [place]              Previous smoker, he smoked for about a year. He drank alcohol but had no problems with it. He worked at a [employer] for 13 years. His hobbies include softball and bowling.  FAMILY HISTORY: Family History  Problem Relation Age of Onset  . Kidney disease Mother   . Kidney disease Sister   . Colon cancer Neg Hx    has no family status information on file.   Had 7 brothers, 8 sisters. He is the youngest. 59 or 29 still living. All the same mother. 1 sister lost to kidney problems 2 sisters lost to breast cancer. Another sister lost to cancer. Mother deceased, from cancer. He is unsure what age or what type of cancer. Brother in law says she was 80 yo when she died and had diverticulitis, was on dialysis, major stomach issue, double mastectomy. Father deceased, he died when he was little so he is unsure when or what he died from.  ALLERGIES:  has No Known Allergies.  MEDICATIONS:  Current Outpatient Prescriptions  Medication Sig Dispense Refill  . aspirin 81 MG EC tablet Take 1 tablet (81 mg total) by mouth daily.    Marland Kitchen atorvastatin (LIPITOR) 80 MG tablet Take 80 mg by mouth daily.    . BD INSULIN SYRINGE ULTRAFINE 31G X 5/16" 0.3 ML MISC     . carvedilol (COREG) 25 MG tablet Take 12.5 mg by mouth 2 (two) times daily with a meal.      . Cholecalciferol (VITAMIN D3) 2000 UNITS TABS Take 2,000 mg by mouth daily.    Marland Kitchen DIGOX 125 MCG tablet TAKE 1/2 TABLET (0.0625MG) BY MOUTH ONCE DAILY. 15 tablet 5  . ferrous sulfate 325 (65 FE) MG tablet Take 325 mg by mouth daily with breakfast.    . furosemide (LASIX) 40 MG tablet TAKE 1 TABLET BY MOUTH ONCE A DAY. 30 tablet 3  . hydrALAZINE (APRESOLINE) 25 MG tablet Take 3 tablets by mouth 3 (three) times daily.    . insulin lispro (HUMALOG) 100 UNIT/ML injection Inject 2-6 Units into the skin 3 (three) times daily with meals.    . isosorbide dinitrate (ISORDIL) 20 MG  tablet Take 2 tablets by mouth 3 (three) times daily.    Marland Kitchen losartan (COZAAR) 25 MG tablet TAKE 1 TABLET BY MOUTH ONCE A DAY. 30 tablet 3  . pantoprazole (PROTONIX) 40 MG tablet Take 1 tablet (40 mg total) by mouth daily. 30 tablet 3  . REA LO 40 40 % CREA   3  . traMADol (ULTRAM) 50 MG tablet 50 mg every 6 (six) hours as needed.     Marland Kitchen NOVOLOG 100 UNIT/ML injection      No current facility-administered medications for this visit.    Review of Systems  Constitutional: Positive for malaise/fatigue. Negative for fever, chills, weight loss and diaphoresis.  HENT: Positive for hearing loss. Negative for congestion, ear pain, nosebleeds and tinnitus.        Has a hearing aid.  Respiratory: Negative.   Cardiovascular: Negative for chest pain.  Gastrointestinal: Negative for heartburn, nausea, vomiting, diarrhea and constipation.  Genitourinary: Negative for dysuria.       Nocturia, used to be 3 times a night but now only 2 times.  Musculoskeletal: Positive for joint pain.       Right arm pain.  Skin: Negative.   Neurological: Negative.  Negative for seizures, weakness and headaches.  Endo/Heme/Allergies: Negative.   Psychiatric/Behavioral: Negative for depression.   14 point ROS was done and is otherwise as detailed above or in HPI  PHYSICAL EXAMINATION:  ECOG PERFORMANCE STATUS: 1 - Symptomatic but completely  ambulatory  Filed Vitals:   12/15/14 0910  BP: 79/50  Pulse: 57  Temp:   Resp: 16   Filed Weights   12/15/14 0825  Weight: 124 lb 4.8 oz (56.382 kg)     Physical Exam  Constitutional: He is oriented to person, place, and time and well-developed, well-nourished, and in no distress.  HENT:  Head: Normocephalic and atraumatic.  Nose: Nose normal.  Mouth/Throat: Oropharynx is clear and moist. No oropharyngeal exudate.  Edentulous.   Eyes: Conjunctivae and EOM are normal. Pupils are equal, round, and reactive to light. Right eye exhibits no discharge. Left eye exhibits no discharge. No scleral icterus.  Neck: Normal range of motion. Neck supple. No tracheal deviation present. No thyromegaly present.  Cardiovascular: Normal rate and regular rhythm.  Exam reveals no gallop and no friction rub.   Murmur heard. Systolic ejection murmur. Defibrillator present.  Pulmonary/Chest: Effort normal and breath sounds normal. He has no wheezes. He has no rales.  Occasional course rhonchi.  Abdominal: Soft. Bowel sounds are normal. He exhibits no distension and no mass. There is no tenderness. There is no rebound and no guarding.  Musculoskeletal: Normal range of motion. He exhibits no edema.  Lymphadenopathy:    He has no cervical adenopathy.  Neurological: He is alert and oriented to person, place, and time. He has normal reflexes. No cranial nerve deficit. Gait normal. Coordination normal.  Skin: Skin is warm and dry. No rash noted.  Psychiatric: Mood, memory, affect and judgment normal.  Nursing note and vitals reviewed.   LABORATORY DATA:  I have reviewed the data as listed  Results for HAYTHAM, MAHER (MRN [id number])   Ref. Range 10/29/2014 16:10  Sodium Latest Ref Range: 135-145 mmol/L 136  Potassium Latest Ref Range: 3.5-5.1 mmol/L 3.9  Chloride Latest Ref Range: 101-111 mmol/L 104  CO2 Latest Ref Range: 22-32 mmol/L 27  BUN Latest Ref Range: 6-20 mg/dL 33 (H)  Creatinine Latest  Ref Range: 0.61-1.24 mg/dL 1.72 (H)  Calcium Latest Ref Range: 8.9-10.3 mg/dL 9.3  EGFR (Non-African Amer.) Latest Ref Range: >60 mL/min 41 (L)  EGFR (African American) Latest Ref Range: >60 mL/min 47 (L)  Glucose Latest Ref Range: 65-99 mg/dL 204 (H)  Anion gap Latest Ref Range: 5-15  5  Alkaline Phosphatase Latest Ref Range: 38-126 U/L 72  Albumin Latest Ref Range: 3.5-5.0 g/dL 3.1 (L)  AST Latest Ref Range: 15-41 U/L 34  ALT Latest Ref Range: 17-63 U/L 25  Total Protein Latest Ref Range: 6.5-8.1 g/dL 7.9  Total Bilirubin Latest Ref Range: 0.3-1.2 mg/dL 0.4  LDH Latest Ref Range: 98-192 U/L 211 (H)  Iron Latest Ref Range: 45-182 ug/dL 60  UIBC Latest Units: ug/dL 137  TIBC Latest Ref Range: 250-450 ug/dL 197 (L)  Saturation Ratios Latest Ref Range: 17.9-39.5 % 30  Ferritin Latest Ref Range: 24-336 ng/mL 312  Folate Latest Ref Range: >5.9 ng/mL 16.5  Vitamin B-12 Latest Ref Range: 180-914 pg/mL 388  WBC Latest Ref Range: 4.0-10.5 K/uL 2.3 (L)  RBC Latest Ref Range: 4.22-5.81 MIL/uL 3.04 (L)  Hemoglobin Latest Ref Range: 13.0-17.0 g/dL 8.9 (L)  HCT Latest Ref Range: 39.0-52.0 % 27.8 (L)  MCV Latest Ref Range: 78.0-100.0 fL 91.4  MCH Latest Ref Range: 26.0-34.0 pg 29.3  MCHC Latest Ref Range: 30.0-36.0 g/dL 32.0  RDW Latest Ref Range: 11.5-15.5 % 12.3  Platelets Latest Ref Range: 150-400 K/uL 151  Neutrophils Latest Ref Range: 43-77 % 46  Lymphocytes Latest Ref Range: 12-46 % 38  Monocytes Relative Latest Ref Range: 3-12 % 13 (H)  Eosinophil Latest Ref Range: 0-5 % 3  Basophil Latest Ref Range: 0-1 % 0  NEUT# Latest Ref Range: 1.7-7.7 K/uL 1.1 (L)  Lymphocyte # Latest Ref Range: 0.7-4.0 K/uL 0.9  Monocyte # Latest Ref Range: 0.1-1.0 K/uL 0.3  Eosinophils Absolute Latest Ref Range: 0.0-0.7 K/uL 0.1  Basophils Absolute Latest Ref Range: 0.0-0.1 K/uL 0.0  RBC. Latest Ref Range: 4.22-5.81 MIL/uL 3.04 (L)  Retic Ct Pct Latest Ref Range: 0.4-3.1 % 0.8  Retic Count, Manual Latest Ref  Range: 19.0-186.0 K/uL 24.3  Haptoglobin Latest Ref Range: 34-200 mg/dL 103  Erythropoietin Latest Ref Range: 2.6-18.5 mIU/mL 10.6  Sed Rate Latest Ref Range: 0-16 mm/hr 81 (H)  Testosterone Latest Ref Range: [id number] ng/dL 467    Results for ARLYNN, MCDERMID (MRN [id number]) as of 12/15/2014 19:04  Ref. Range 12/15/2014 08:52  WBC Latest Ref Range: 4.0-10.5 K/uL 2.5 (L)  RBC Latest Ref Range: 4.22-5.81 MIL/uL 3.40 (L)  Hemoglobin Latest Ref Range: 13.0-17.0 g/dL 9.9 (L)  HCT Latest Ref Range: 39.0-52.0 % 30.0 (L)  MCV Latest Ref Range: 78.0-100.0 fL 88.2  MCH Latest Ref Range: 26.0-34.0 pg 29.1  MCHC Latest Ref Range: 30.0-36.0 g/dL 33.0  RDW Latest Ref Range: 11.5-15.5 % 12.5  Platelets Latest Ref Range: 150-400 K/uL 143 (L)  Neutrophils Latest Ref Range: 43-77 % 41 (L)  Lymphocytes Latest Ref Range: 12-46 % 41  Monocytes Relative Latest Ref Range: 3-12 % 13 (H)  Eosinophil Latest Ref Range: 0-5 % 4  Basophil Latest Ref Range: 0-1 % 1  NEUT# Latest Ref Range: 1.7-7.7 K/uL 1.1 (L)  Lymphocyte # Latest Ref Range: 0.7-4.0 K/uL 1.0  Monocyte # Latest Ref Range: 0.1-1.0 K/uL 0.3  Eosinophils Absolute Latest Ref Range: 0.0-0.7 K/uL 0.1  Basophils Absolute Latest Ref Range: 0.0-0.1 K/uL 0.0    RADIOGRAPHIC STUDIES: I have personally reviewed the radiological images as listed and agreed with the findings in the report.   CLINICAL DATA: Elevated hepatic function studies ; history  of chronic renal insufficiency  EXAM: ULTRASOUND ABDOMEN COMPLETE  COMPARISON: None.  FINDINGS: Gallbladder: The gallbladder is adequately distended and skin pains echogenic sludge and non shadowing but mobile stones. There is no gallbladder wall thickening or pericholecystic fluid. There is no positive sonographic Murphy's sign.  Common bile duct: Diameter: 6 mm  Liver: The liver exhibits no focal mass nor ductal dilation. The echotexture is normal.  IVC: No abnormality  visualized.  Pancreas: Visualized portion unremarkable.  Spleen: The spleen is mildly enlarged with a calculated volume of 485 cc  Right Kidney: Length: 10.1 cm. In the midpole of the right kidney there is a simple appearing cyst measuring 2.1 x 1.5 x 2.1 cm. There is no hydronephrosis.  Left Kidney: Length: 11 cm. In the mid to lower pole there is a 1.5 x 1.5 x 1.2 cm diameter simple appearing cyst. There is no hydronephrosis.  Abdominal aorta: No aneurysm visualized.  Other findings: None.  IMPRESSION: 1. Gallstones and sludge without evidence of acute cholecystitis. 2. The liver and pancreas and common bile duct exhibit no acute abnormality. There is mild splenomegaly. 3. The renal cortical echotexture is mildly increased consistent with medical renal disease. There is no hydronephrosis. There are simple appearing cysts in both kidneys.   Electronically Signed  By: David Martinique  On: 05/14/2014 10:20     Bone Marrow Biopsy and Aspiration Procedure Note   Informed consent was obtained and potential risks including bleeding, infection and pain were reviewed with the patient. I verified that the patient has been fasting since midnight.  The patient's name, date of birth, identification, consent and allergies were verified prior to the start of procedure and time out was performed.  The left posterior iliac crest was chosen as the site of biopsy.  The skin was prepped with Betadine solution.   8 cc of 1% lidocaine was used to provide local anaesthesia.   10 cc of bone marrow aspirate was obtained followed by 1 inch biopsy.  Pressure was applied to the biopsy site and bandage was placed over the biopsy site. Patient was made to lie on back for 30 mins prior to discharge.  The procedure was tolerated well. COMPLICATIONS: None BLOOD LOSS: none The patient was discharged home in stable condition with a 1 week follow up to review results. She was provided  with post bone marrow biopsy instructions and instructed to call if there was any bleeding or worsening pain.  Specimens sent for flow cytometry, cytogenetics and additional studies.   ASSESSMENT & PLAN:  Anemia, normochromic, normocytic Stage III CKD Grossly abnormal stomach on EGD, gastric polyps History of IV Iron replacement Elevated ESR at 81 mm/hr  I discussed the patient's anemia with him in detail. I discussed his nephrologist's concern. I advised him that his CKD is contributing to his anemia but does not explain the severity of it.   He has a significant drop in his H/H from December until March which may suggest potential GI related blood loss. He has had a recent EGD and Colonoscopy in November of last year. He will need iron replacement prn. Iron studies obtained on 6/1 were WNL.  The patient states he is willing to proceed with evaluation as deemed necessary. I have recommended a bone marrow biopsy for additional evaluation of his anemia. I do believe his anemia is multifactorial related to his chronic kidney disease, potential GI related blood loss but remainder of his peripheral evaluation was relatively unremarkable. Note that he did have  an elevated sedimentation rate. Bone marrow biopsy will be valuable in ruling out any contribution from bone marrow dysfunction.  He did well with the procedure today.  We have advised the patient to remove the pressure bandage tomorrow am.  He knows to call with any problems such as bleeding, fever or pain.  I will see him back in 2 weeks to review.  NOTE a total of 40 minutes was spent in direct patient consultation and advisement.  All questions were answered. The patient knows to call the clinic with any problems, questions or concerns.   This note was electronically signed.    This document serves as a record of services personally performed by Ancil Linsey, MD. It was created on her behalf by Janace Hoard, a trained medical  scribe. The creation of this record is based on the scribe's personal observations and the provider's statements to them. This document has been checked and approved by the attending provider.  I have reviewed the above documentation for accuracy and completeness, and I agree with the above.  Kelby Fam. Whitney Muse, MD

## 2014-12-15 NOTE — Patient Instructions (Signed)
[place] at [facility] Discharge Instructions  RECOMMENDATIONS MADE BY THE CONSULTANT AND ANY TEST RESULTS WILL BE SENT TO YOUR REFERRING PHYSICIAN.  Bone marrow biopsy and aspiration was performed today You can take your tramadol as needed for pain Keep the dressing clean and dry for 24 hours.   Thank you for choosing [facility] to provide your oncology and hematology care.  To afford each patient quality time with our provider, please arrive at least 15 minutes before your scheduled appointment time.    You need to re-schedule your appointment should you arrive 10 or more minutes late.  We strive to give you quality time with our providers, and arriving late affects you and other patients whose appointments are after yours.  Also, if you no show three or more times for appointments you may be dismissed from the clinic at the providers discretion.     Again, thank you for choosing [facility].  Our hope is that these requests will decrease the amount of time that you wait before being seen by our physicians.       _____________________________________________________________  Should you have questions after your visit to [facility], please contact our office at [phone] between the hours of 8:30 a.m. and 4:30 p.m.  Voicemails left after 4:30 p.m. will not be returned until the following business day.  For prescription refill requests, have your pharmacy contact our office.    Bone Marrow Aspiration, Bone Marrow Biopsy Care After Read the instructions outlined below and refer to this sheet in the next few weeks. These discharge instructions provide you with general information on caring for yourself after you leave the hospital. Your caregiver may also give you specific instructions. While your treatment has been planned according to the most current medical practices available, unavoidable  complications occasionally occur. If you have any problems or questions after discharge, call your caregiver. FINDING OUT THE RESULTS OF YOUR TEST Not all test results are available during your visit. If your test results are not back during the visit, make an appointment with your caregiver to find out the results. Do not assume everything is normal if you have not heard from your caregiver or the medical facility. It is important for you to follow up on all of your test results.  HOME CARE INSTRUCTIONS  You have had sedation and may be sleepy or dizzy. Your thinking may not be as clear as usual. For the next 24 hours:  Only take over-the-counter or prescription medicines for pain, discomfort, and or fever as directed by your caregiver.  Keep your dressing clean and dry. You may replace dressing with a bandage after 24 hours.  You may take a bath or shower after 24 hours.  Use an ice pack for 20 minutes every 2 hours while awake for pain as needed. SEEK MEDICAL CARE IF:   There is redness, swelling, or increasing pain at the biopsy site.  There is pus coming from the biopsy site.  There is drainage from a biopsy site lasting longer than one day.  An unexplained oral temperature above 102 F (38.9 C) develops. SEEK IMMEDIATE MEDICAL CARE IF:   You develop a rash.  You have difficulty breathing.  You develop any reaction or side effects to medications given. Document Released: 12/03/2004 Document Revised: 08/08/2011 Document Reviewed: 05/13/2008 [facility] Patient Information 2015 [place], [place]. This information is not intended to replace advice  to you by your health care provider. Make sure you discuss any questions you have with your health care provider.  

## 2014-12-15 NOTE — Progress Notes (Signed)
Keith Young presented for labwork. Labs per MD order drawn via Peripheral Line 23 gauge needle inserted in left AC  Good blood return present. Procedure without incident.  Needle removed intact. Patient tolerated procedure well.     [facility] BONE MARROW BIOPSY/ASPIRATE PROGRESS NOTE  Keith Young presents for Bone Marrow biopsy per MD orders. Keith Young verbalized understanding of procedure. Consent reviewed and signed.  Okey Regal Haupert positioned supine for procedure. Time-out performed and Bone Marrow Checklist. Procedure began at 0853. Xylocaine 1% 13 cc used for local and administered to patient by Robynn Pane, PA-C. Procedure completed at 0910. Patient tolerated well. Pressure dressing applied to the left hip with instructions to leave in place for 24 hours. Patient instructed to report any bleeding that saturates dressing and to take pain medication Tramadol as directed. Dressing dry and intact to the left hip on discharge.

## 2014-12-24 LAB — TISSUE HYBRIDIZATION (BONE MARROW)-NCBH

## 2014-12-24 LAB — CHROMOSOME ANALYSIS, BONE MARROW

## 2014-12-30 ENCOUNTER — Encounter (HOSPITAL_COMMUNITY): Payer: Self-pay

## 2014-12-31 ENCOUNTER — Ambulatory Visit (HOSPITAL_COMMUNITY): Payer: Medicare HMO | Admitting: Hematology & Oncology

## 2014-12-31 ENCOUNTER — Encounter (HOSPITAL_COMMUNITY): Payer: Medicare HMO | Attending: Hematology & Oncology | Admitting: Hematology & Oncology

## 2014-12-31 ENCOUNTER — Encounter (HOSPITAL_COMMUNITY): Payer: Self-pay | Admitting: Hematology & Oncology

## 2014-12-31 ENCOUNTER — Ambulatory Visit (HOSPITAL_COMMUNITY)
Admission: RE | Admit: 2014-12-31 | Discharge: 2014-12-31 | Disposition: A | Discharge status: Home / Self Care | Payer: Medicare HMO | Source: Ambulatory Visit | Attending: Hematology & Oncology | Admitting: Hematology & Oncology

## 2014-12-31 ENCOUNTER — Ambulatory Visit (HOSPITAL_COMMUNITY): Payer: Medicare HMO

## 2014-12-31 ENCOUNTER — Other Ambulatory Visit (HOSPITAL_COMMUNITY): Payer: Self-pay

## 2014-12-31 VITALS — BP 98/57 | HR 66 | Temp 97.9°F | Resp 16 | Wt 124.0 lb

## 2014-12-31 DIAGNOSIS — I739 Peripheral vascular disease, unspecified: Secondary | ICD-10-CM | POA: Insufficient documentation

## 2014-12-31 DIAGNOSIS — N183 Chronic kidney disease, stage 3 unspecified: Secondary | ICD-10-CM

## 2014-12-31 DIAGNOSIS — I6529 Occlusion and stenosis of unspecified carotid artery: Secondary | ICD-10-CM | POA: Insufficient documentation

## 2014-12-31 DIAGNOSIS — D72822 Plasmacytosis: Secondary | ICD-10-CM

## 2014-12-31 DIAGNOSIS — N189 Chronic kidney disease, unspecified: Secondary | ICD-10-CM

## 2014-12-31 DIAGNOSIS — Z951 Presence of aortocoronary bypass graft: Secondary | ICD-10-CM | POA: Diagnosis not present

## 2014-12-31 DIAGNOSIS — D631 Anemia in chronic kidney disease: Secondary | ICD-10-CM | POA: Insufficient documentation

## 2014-12-31 DIAGNOSIS — M8580 Other specified disorders of bone density and structure, unspecified site: Secondary | ICD-10-CM | POA: Diagnosis not present

## 2014-12-31 DIAGNOSIS — D649 Anemia, unspecified: Secondary | ICD-10-CM | POA: Insufficient documentation

## 2014-12-31 NOTE — Progress Notes (Signed)
[place] [facility] [place] NOTE  Patient Care Team: Sinda Du, MD as PCP - General (Pulmonary Disease) Daneil Dolin, MD as Consulting Physician (Gastroenterology)  CHIEF COMPLAINTS/PURPOSE OF CONSULTATION:  Iron deficiency anemia Chronic kidney disease, Stage III History of IV iron replacement History of cocaine abuse Cardiac defibrillator placement, 11/12/2012 Colonoscopy & EGD 04/10/2014 with Dr. Sydell Axon Diverticulosis and diffusely abnormal stomach and mild erosive esophagitis 04/10/2014 Biopsy showing chronic gastritis 04/10/2014 Elevated ESR at 81 mm/hr  HISTORY OF PRESENTING ILLNESS:  Keith Young 64 y.o. male is here because of anemia. He has been referred by Dr. Theador Hawthorne as his anemia is felt to be out of proportion to his CKD.  He lives at [facility] and has been there for a year now. He moved there because "it was getting hard to walk" and "hard to do stuff for himself." There are 5 residents there.  He received 2 iron infusions in 02/2014 here at [facility], he believes his "kidney doctor" ordered them.  Laboratory studies from 08/04/2014 showed a hemoglobin of 8.9, hematocrit of 27.1, normal vitamin B12, normal serum folate, and a serum ferritin of 465 NG/ML. Hemoglobin from 05/06/2014 was at 10.8, hematocrit was 31.8, serum ferritin was 613 NG/ML.  His EGD report was reviewed from 03/2014, Dr.Rourke felt is was possible that the patient may have intermittent "oozing from his stomach" that was contributing to his anemia.   Overall, the patient is feeling well.  The results of his bone marrow biopsy were discussed.  He says that his energy and appetite is normal and he denies any new pain. His care giver expresses concern for his financial status due to the multiple medical bills he has been receiving. They are requesting to speak with Angie today.   MEDICAL HISTORY:  Past Medical History  Diagnosis Date  . Chronic systolic  heart failure     NYHA Class III  . Type 2 diabetes mellitus   . Nonischemic cardiomyopathy     EF 15-20%  . Essential hypertension, benign   . CKD (chronic kidney disease) stage 3, GFR 30-59 ml/min   . Anemia   . Hyperlipidemia   . History of stroke   . Bell's palsy   . Cardiomyopathy   . Aortic insufficiency   . Alcoholism in remission   . Noncompliance with medication regimen   . NSVT (nonsustained ventricular tachycardia)   . Cardiac defibrillator in situ   . COPD (chronic obstructive pulmonary disease)   . CVA (cerebral infarction) 01/2013  . Productive cough 08/2013    With brown sputum   . SOB (shortness of breath) on exertion 08/2013  . Numbness of right jaw     Had a stoke in 01/2013. Numbness is occasional, especially when trying to chew.  . At moderate risk for fall   . AICD (automatic cardioverter/defibrillator) present   . Stroke     weakness of right side from CVA  . Arthritis     SURGICAL HISTORY: Past Surgical History  Procedure Laterality Date  . Cardiac defibrillator placement  11/12/12    [employer] Inogen MINI ICD implanted in [place] at [facility]  . Cardiac surgery      bioprostetic AVR per Dr Nelly Laurence note  . Colonoscopy with propofol N/A 04/10/2014    RMR: Colonic Diverticulosis  . Esophagogastroduodenoscopy (egd) with propofol N/A 04/10/2014    RMR: Mild erosive reflux esophagitis. Multiple antral polyps likely hyperplastic status post removal by hot  snare cautery technique. Diffusely abnormal stomach status post gastric biopsy I suspect some of patients anemaia may be due to intermittent oozing from the stomach. It would be difficult and a risky proposition to attempt complete removal of all of his gastric polyps.  . Polypectomy N/A 04/10/2014    Procedure: GASTRIC POLYPECTOMY;  Surgeon: Daneil Dolin, MD;  Location: [facility];  Service: Endoscopy;  Laterality: N/A;  . Esophageal biopsy N/A 04/10/2014    Procedure: GASTRIC BIOPSY;   Surgeon: Daneil Dolin, MD;  Location: [facility];  Service: Endoscopy;  Laterality: N/A;  . Right heart catheterization N/A 02/06/2014    Procedure: RIGHT HEART CATH;  Surgeon: Larey Dresser, MD;  Location: [facility] CATH LAB;  Service: Cardiovascular;  Laterality: N/A;    SOCIAL HISTORY: History   Social History  . Marital Status: Single    Spouse Name: N/A  . Number of Children: N/A  . Years of Education: N/A   Occupational History  . Not on file.   Social History Main Topics  . Smoking status: Former Smoker    Types: Cigarettes    Quit date: 11/05/1993  . Smokeless tobacco: Never Used  . Alcohol Use: Yes     Comment: former heavy ETOH, none recently  . Drug Use: No     Comment: prior history of cocaine use, smoking   . Sexual Activity: Yes    Birth Control/ Protection: None   Other Topics Concern  . Not on file   Social History Narrative   Recently moved from [place] to be with family in [place]              Previous smoker, he smoked for about a year. He drank alcohol but had no problems with it. He worked at a [employer] for 13 years. His hobbies include softball and bowling.  FAMILY HISTORY: Family History  Problem Relation Age of Onset  . Kidney disease Mother   . Kidney disease Sister   . Colon cancer Neg Hx    has no family status information on file.   Had 7 brothers, 8 sisters. He is the youngest. 29 or 66 still living. All the same mother. 1 sister lost to kidney problems 2 sisters lost to breast cancer. Another sister lost to cancer. Mother deceased, from cancer. He is unsure what age or what type of cancer. Brother in law says she was 28 yo when she died and had diverticulitis, was on dialysis, major stomach issue, double mastectomy. Father deceased, he died when he was little so he is unsure when or what he died from.  ALLERGIES:  has No Known Allergies.  MEDICATIONS:  Current Outpatient Prescriptions  Medication Sig Dispense Refill  . aspirin 81  MG EC tablet Take 1 tablet (81 mg total) by mouth daily.    Marland Kitchen atorvastatin (LIPITOR) 80 MG tablet Take 80 mg by mouth daily.    . BD INSULIN SYRINGE ULTRAFINE 31G X 5/16" 0.3 ML MISC     . carvedilol (COREG) 25 MG tablet Take 12.5 mg by mouth 2 (two) times daily with a meal.     . Cholecalciferol (VITAMIN D3) 2000 UNITS TABS Take 2,000 mg by mouth daily.    Marland Kitchen DIGOX 125 MCG tablet TAKE 1/2 TABLET (0.0625MG) BY MOUTH ONCE DAILY. 15 tablet 5  . ferrous sulfate 325 (65 FE) MG tablet Take 325 mg by mouth daily with breakfast.    . furosemide (LASIX) 40 MG tablet TAKE 1 TABLET BY MOUTH ONCE  A DAY. 30 tablet 3  . hydrALAZINE (APRESOLINE) 25 MG tablet Take 3 tablets by mouth 3 (three) times daily.    . insulin lispro (HUMALOG) 100 UNIT/ML injection Inject 2-6 Units into the skin 3 (three) times daily with meals.    . isosorbide dinitrate (ISORDIL) 20 MG tablet Take 2 tablets by mouth 3 (three) times daily.    Marland Kitchen losartan (COZAAR) 25 MG tablet TAKE 1 TABLET BY MOUTH ONCE A DAY. 30 tablet 3  . NOVOLOG 100 UNIT/ML injection     . pantoprazole (PROTONIX) 40 MG tablet Take 1 tablet (40 mg total) by mouth daily. 30 tablet 3  . REA LO 40 40 % CREA   3  . traMADol (ULTRAM) 50 MG tablet 50 mg every 6 (six) hours as needed.      No current facility-administered medications for this visit.    Review of Systems  Constitutional: Positive for malaise/fatigue. Negative for fever, chills, weight loss and diaphoresis.  HENT: Positive for hearing loss. Negative for congestion, ear pain, nosebleeds and tinnitus.        Has a hearing aid.  Respiratory: Negative.   Cardiovascular: Negative for chest pain.  Gastrointestinal: Negative for heartburn, nausea, vomiting, diarrhea and constipation.  Genitourinary: Negative for dysuria.       Nocturia, used to be 3 times a night but now only 2 times.  Musculoskeletal: Positive for joint pain.       Right arm pain.  Skin: Negative.   Neurological: Negative.  Negative for  seizures, weakness and headaches.  Endo/Heme/Allergies: Negative.   Psychiatric/Behavioral: Negative for depression.   14 point ROS was done and is otherwise as detailed above or in HPI  PHYSICAL EXAMINATION:  ECOG PERFORMANCE STATUS: 1 - Symptomatic but completely ambulatory  Filed Vitals:   12/31/14 1023  BP: 98/57  Pulse: 66  Temp: 97.9 F (36.6 C)  Resp: 16   There were no vitals filed for this visit.   Physical Exam  Constitutional: He is oriented to person, place, and time and well-developed, well-nourished, and in no distress.  HENT:  Head: Normocephalic and atraumatic.  Nose: Nose normal.  Mouth/Throat: Oropharynx is clear and moist. No oropharyngeal exudate.  Edentulous.   Eyes: Conjunctivae and EOM are normal. Pupils are equal, round, and reactive to light. Right eye exhibits no discharge. Left eye exhibits no discharge. No scleral icterus.  Neck: Normal range of motion. Neck supple. No tracheal deviation present. No thyromegaly present.  Cardiovascular: Normal rate and regular rhythm.  Exam reveals no gallop and no friction rub.   Murmur heard. Systolic ejection murmur. Defibrillator present.  Pulmonary/Chest: Effort normal and breath sounds normal. He has no wheezes. He has no rales.  Occasional course rhonchi.  Abdominal: Soft. Bowel sounds are normal. He exhibits no distension and no mass. There is no tenderness. There is no rebound and no guarding.  Musculoskeletal: Normal range of motion. He exhibits no edema.  Lymphadenopathy:    He has no cervical adenopathy.  Neurological: He is alert and oriented to person, place, and time. He has normal reflexes. No cranial nerve deficit. Gait normal. Coordination normal.  Skin: Skin is warm and dry. No rash noted.  bone marrow biopsy site is well-healed.  Psychiatric: Mood, memory, affect and judgment normal.  Nursing note and vitals reviewed.   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 - SEE COMMENT.  PERIPHERAL BLOOD: - LEUKOPENIA AND NORMOCYTIC ANEMIA. - MILD THROMBOCYTOPENIA. Diagnosis Note The bone marrow is normocellular, however, there is a mild increase in plasma cells (10% by aspirate and CD138). There are scattered atypical forms. Light chain immunohistochemistry is technically unsatisfactory and fails to stain the plasma cells. Slides will be sent out for light chain in situ hybridization and the results reported in an addendum. There is no significant dysplasia in any lineage. Vicente Males MD Pathologist, Electronic Signature  BONE MARROW REPORT Signed Others Kappa and lambda in situ hybridization is technically unsatisfactory and fails to highlight the plasma cells. (JM:ecj 12/24/2014) Vicente Males MD Pathologist, Electronic    RADIOGRAPHIC STUDIES: I have personally reviewed the radiological images as listed and agreed with the findings in the report.   CLINICAL DATA: Elevated hepatic function studies ; history of chronic renal insufficiency  EXAM: ULTRASOUND ABDOMEN COMPLETE  COMPARISON: None.  FINDINGS: Gallbladder: The gallbladder is adequately distended and skin pains echogenic sludge and non shadowing but mobile stones. There is no gallbladder wall thickening or pericholecystic fluid. There is no positive sonographic Murphy's sign.  Common bile duct: Diameter: 6 mm  Liver: The liver exhibits no focal mass nor ductal dilation. The echotexture is normal.  IVC: No abnormality visualized.  Pancreas: Visualized portion unremarkable.  Spleen: The spleen is mildly enlarged with a calculated volume of 485 cc  Right Kidney: Length: 10.1 cm. In the midpole of the right kidney there is a simple appearing cyst measuring 2.1 x 1.5 x 2.1 cm. There is no hydronephrosis.  Left Kidney: Length: 11 cm. In the mid to  lower pole there is a 1.5 x 1.5 x 1.2 cm diameter simple appearing cyst. There is no hydronephrosis.  Abdominal aorta: No aneurysm visualized.  Other findings: None.  IMPRESSION: 1. Gallstones and sludge without evidence of acute cholecystitis. 2. The liver and pancreas and common bile duct exhibit no acute abnormality. There is mild splenomegaly. 3. The renal cortical echotexture is mildly increased consistent with medical renal disease. There is no hydronephrosis. There are simple appearing cysts in both kidneys.   Electronically Signed  By: David Martinique  On: 05/14/2014 10:20   CLINICAL DATA: Renal failure. Plasmacytosis.  EXAM: METASTATIC BONE SURVEY  COMPARISON: None.  FINDINGS: Imaging of the axial and appendicular skeleton performed. Diffuse osteopenia is noted. Subtle lucencies in the skull appear corticated and are most likely vascular lakes. Evidence of old infarcts noted about the distal femurs as well as of proximal and distal tibias. Diffuse multilevel degenerative changes are present. Peripheral vascular calcification is noted. Carotid vascular calcification . Cardiac pacer noted. Prior CABG. Cardiomegaly. No pulmonary venous congestion.  IMPRESSION: 1. Diffuse osteopenia.  2. Old infarcts about the distal femurs as well as of proximal and distal tibias.  3. Multifocal degenerative change.  3. Peripheral vascular disease. Carotid vascular disease. Prior CABG. Cardiac pacer noted. Cardiomegaly. No CHF .   Electronically Signed  By: Marcello Moores Register  On: 12/31/2014 12:45  ASSESSMENT & PLAN:  Anemia, normochromic, normocytic Stage III CKD Grossly abnormal stomach on EGD, gastric polyps History of IV Iron replacement Elevated ESR at 81 mm/hr BMBX with normal cytogenetics, 10% plasma cells, but light chain immunohistochemistry and in situ hybridization or unsatisfactory Myeloma survey with osteopenia EGD and colonoscopy in  03/2014 No M-spike, polyclonal increase in gamma globulin   He has a significant drop in his H/H from December until March which may suggest potential GI related blood loss. He has had a recent EGD and  Colonoscopy in November of last year. He will need iron replacement prn. Iron studies obtained on 6/1 were WNL.  He has evidence of diffuse osteopenia, anemia, chronic kidney disease and bone marrow biopsy shows 10% plasma cells but unfortunately I cannot determine monoclonality from his marrow. Cytogenetics were normal. I have recommended beginning him on Aranesp given his chronic kidney disease and reassessing moving forward how is anemia improves. He may need a capsule endoscopy at some point given suspicion of GI related blood loss. I also discussed with the patient and his caregiver we may need to repeat his bone marrow to reevaluate his plasmacytosis. Note that peripherally he does not have an M spike but has evidence of polyclonal plasmacytosis.  I have provided him with a 24 hour urine test for UPEP/UIEP.  All questions were answered. The patient knows to call the clinic with any problems, questions or concerns.    This note was electronically signed.    This document serves as a record of services personally performed by Ancil Linsey, MD. It was created on her behalf by Janace Hoard, a trained medical scribe. The creation of this record is based on the scribe's personal observations and the provider's statements to them. This document has been checked and approved by the attending provider.  I have reviewed the above documentation for accuracy and completeness, and I agree with the above.  Kelby Fam. Whitney Muse, MD

## 2014-12-31 NOTE — Patient Instructions (Addendum)
[place] [facility] Discharge Instructions  RECOMMENDATIONS MADE BY THE CONSULTANT AND ANY TEST RESULTS WILL BE SENT TO YOUR REFERRING PHYSICIAN.  Exam and discussion with Dr. Whitney Muse today. Bone scan in radiology at your earliest convenience Return in 6 weeks for office visit. Aranesp injections as scheduled.   Darbepoetin Alfa injection What is this medicine? DARBEPOETIN ALFA (dar be POE e tin AL fa) helps your body make more red blood cells. It is used to treat anemia caused by chronic kidney failure and chemotherapy. This medicine may be used for other purposes; ask your health care provider or pharmacist if you have questions. COMMON BRAND NAME(S): Aranesp What should I tell my health care provider before I take this medicine? They need to know if you have any of these conditions: -blood clotting disorders or history of blood clots -cancer patient not on chemotherapy -cystic fibrosis -heart disease, such as angina, heart failure, or a history of a heart attack -hemoglobin level of 12 g/dL or greater -high blood pressure -low levels of folate, iron, or vitamin B12 -seizures -an unusual or allergic reaction to darbepoetin, erythropoietin, albumin, hamster proteins, latex, other medicines, foods, dyes, or preservatives -pregnant or trying to get pregnant -breast-feeding How should I use this medicine? This medicine is for injection into a vein or under the skin. It is usually given by a health care professional in a hospital or clinic setting. If you get this medicine at home, you will be taught how to prepare and give this medicine. Do not shake the solution before you withdraw a dose. Use exactly as directed. Take your medicine at regular intervals. Do not take your medicine more often than directed. It is important that you put your used needles and syringes in a special sharps container. Do not put them in a trash can. If you do not have a sharps  container, call your pharmacist or healthcare provider to get one. Talk to your pediatrician regarding the use of this medicine in children. While this medicine may be used in children as young as 1 year for selected conditions, precautions do apply. Overdosage: If you think you have taken too much of this medicine contact a poison control center or emergency room at once. NOTE: This medicine is only for you. Do not share this medicine with others. What if I miss a dose? If you miss a dose, take it as soon as you can. If it is almost time for your next dose, take only that dose. Do not take double or extra doses. What may interact with this medicine? Do not take this medicine with any of the following medications: -epoetin alfa This list may not describe all possible interactions. Give your health care provider a list of all the medicines, herbs, non-prescription drugs, or dietary supplements you use. Also tell them if you smoke, drink alcohol, or use illegal drugs. Some items may interact with your medicine. What should I watch for while using this medicine? Visit your prescriber or health care professional for regular checks on your progress and for the needed blood tests and blood pressure measurements. It is especially important for the doctor to make sure your hemoglobin level is in the desired range, to limit the risk of potential side effects and to give you the best benefit. Keep all appointments for any recommended tests. Check your blood pressure as directed. Ask your doctor what your blood pressure should be and when you should contact him or her.  As your body makes more red blood cells, you may need to take iron, folic acid, or vitamin B supplements. Ask your doctor or health care provider which products are right for you. If you have kidney disease continue dietary restrictions, even though this medication can make you feel better. Talk with your doctor or health care professional about the  foods you eat and the vitamins that you take. What side effects may I notice from receiving this medicine? Side effects that you should report to your doctor or health care professional as soon as possible: -allergic reactions like skin rash, itching or hives, swelling of the face, lips, or tongue -breathing problems -changes in vision -chest pain -confusion, trouble speaking or understanding -feeling faint or lightheaded, falls -high blood pressure -muscle aches or pains -pain, swelling, warmth in the leg -rapid weight gain -severe headaches -sudden numbness or weakness of the face, arm or leg -trouble walking, dizziness, loss of balance or coordination -seizures (convulsions) -swelling of the ankles, feet, hands -unusually weak or tired Side effects that usually do not require medical attention (report to your doctor or health care professional if they continue or are bothersome): -diarrhea -fever, chills (flu-like symptoms) -headaches -nausea, vomiting -redness, stinging, or swelling at site where injected This list may not describe all possible side effects. Call your doctor for medical advice about side effects. You may report side effects to FDA at 1-800-FDA-1088. Where should I keep my medicine? Keep out of the reach of children. Store in a refrigerator between 2 and 8 degrees C (36 and 46 degrees F). Do not freeze. Do not shake. Throw away any unused portion if using a single-dose vial. Throw away any unused medicine after the expiration date. NOTE: This sheet is a summary. It may not cover all possible information. If you have questions about this medicine, talk to your doctor, pharmacist, or health care provider.  2015, Elsevier/Gold Standard. (2008-04-29 10:23:57)   Thank you for choosing [place] at [facility] to provide your oncology and hematology care.  To afford each patient quality time with our provider, please arrive at least 15 minutes  before your scheduled appointment time.    You need to re-schedule your appointment should you arrive 10 or more minutes late.  We strive to give you quality time with our providers, and arriving late affects you and other patients whose appointments are after yours.  Also, if you no show three or more times for appointments you may be dismissed from the clinic at the providers discretion.     Again, thank you for choosing [facility].  Our hope is that these requests will decrease the amount of time that you wait before being seen by our physicians.       _____________________________________________________________  Should you have questions after your visit to [facility], please contact our office at [phone] between the hours of 8:30 a.m. and 4:30 p.m.  Voicemails left after 4:30 p.m. will not be returned until the following business day.  For prescription refill requests, have your pharmacy contact our office.      24-Hour Urine Collection HOME CARE  When you get up in the morning on the day you do this test, pee (urinate) in the toilet and flush. Make a note of the time. This will be your start time on the day of collection and the end time on the next morning.  From then on, save all your pee (  urine) in the plastic jug that was given to you.  You should stop collecting your pee 24 hours after you started.  If the plastic jug that is given to you already has liquid in it, that is okay. Do not throw out the liquid or rinse out the jug. Some tests need the liquid to be added to your pee.  Keep your plastic jug cool (in an ice chest or the refrigerator) during the test.  When the 24 hours is over, bring your plastic jug to the clinic lab. Keep the jug cool (in an ice chest) while you are bringing it to the lab. Document Released: 08/12/2008 Document Revised: 08/08/2011 Document Reviewed: 08/12/2008 [facility] Patient Information 2015 [place], [place]. This  information is not intended to replace advice given to you by your health care provider. Make sure you discuss any questions you have with your health care provider.

## 2015-01-01 DIAGNOSIS — D649 Anemia, unspecified: Secondary | ICD-10-CM | POA: Diagnosis present

## 2015-01-05 ENCOUNTER — Other Ambulatory Visit: Payer: Self-pay | Admitting: Nurse Practitioner

## 2015-01-09 LAB — UIFE/LIGHT CHAINS/TP QN, 24-HR UR
% BETA, URINE: 12.6 %
ALPHA 1 URINE: 1.1 %
Albumin, U: 61 %
Alpha 2, Urine: 4.4 %
FREE KAPPA/LAMBDA RATIO: 12.17 — AB (ref 2.04–10.37)
Free Lambda Lt Chains,Ur: 55.3 mg/L — ABNORMAL HIGH (ref 0.24–6.66)
Free Lt Chn Excr Rate: 673 mg/L — ABNORMAL HIGH (ref 1.35–24.19)
GAMMA GLOBULIN URINE: 20.9 %
TIME-UPE24: 24 h
Total Protein, Urine-Ur/day: 652.7 mg/24 hr — ABNORMAL HIGH (ref 30.0–150.0)
Total Protein, Urine: 42.8 mg/dL
VOLUME, URINE-UPE24: 1550 mL

## 2015-01-13 ENCOUNTER — Encounter (HOSPITAL_COMMUNITY): Payer: Medicare HMO

## 2015-01-13 DIAGNOSIS — D631 Anemia in chronic kidney disease: Secondary | ICD-10-CM

## 2015-01-13 DIAGNOSIS — D649 Anemia, unspecified: Secondary | ICD-10-CM | POA: Diagnosis not present

## 2015-01-13 DIAGNOSIS — N189 Chronic kidney disease, unspecified: Secondary | ICD-10-CM

## 2015-01-13 LAB — CBC
HEMATOCRIT: 28 % — AB (ref 39.0–52.0)
HEMOGLOBIN: 9 g/dL — AB (ref 13.0–17.0)
MCH: 28.8 pg (ref 26.0–34.0)
MCHC: 32.1 g/dL (ref 30.0–36.0)
MCV: 89.5 fL (ref 78.0–100.0)
Platelets: 120 10*3/uL — ABNORMAL LOW (ref 150–400)
RBC: 3.13 MIL/uL — AB (ref 4.22–5.81)
RDW: 13.1 % (ref 11.5–15.5)
WBC: 2.4 10*3/uL — AB (ref 4.0–10.5)

## 2015-01-13 NOTE — Progress Notes (Signed)
Needs prior authorization before injection can be given

## 2015-01-14 ENCOUNTER — Other Ambulatory Visit (HOSPITAL_COMMUNITY): Payer: Self-pay | Admitting: Hematology & Oncology

## 2015-01-15 ENCOUNTER — Other Ambulatory Visit (HOSPITAL_COMMUNITY): Payer: Self-pay | Admitting: Oncology

## 2015-01-21 ENCOUNTER — Encounter (HOSPITAL_COMMUNITY): Payer: Self-pay

## 2015-01-21 ENCOUNTER — Encounter (HOSPITAL_BASED_OUTPATIENT_CLINIC_OR_DEPARTMENT_OTHER): Payer: Medicare HMO

## 2015-01-21 VITALS — BP 118/66 | HR 66 | Resp 18

## 2015-01-21 DIAGNOSIS — D631 Anemia in chronic kidney disease: Secondary | ICD-10-CM

## 2015-01-21 DIAGNOSIS — N189 Chronic kidney disease, unspecified: Secondary | ICD-10-CM | POA: Diagnosis not present

## 2015-01-21 MED ORDER — DARBEPOETIN ALFA 60 MCG/0.3ML IJ SOSY
50.0000 ug | PREFILLED_SYRINGE | Freq: Once | INTRAMUSCULAR | Status: AC
  Administered 2015-01-21: 50 ug via SUBCUTANEOUS

## 2015-01-21 MED ORDER — DARBEPOETIN ALFA 60 MCG/0.3ML IJ SOSY
PREFILLED_SYRINGE | INTRAMUSCULAR | Status: AC
  Filled 2015-01-21: qty 0.3

## 2015-01-21 NOTE — Progress Notes (Signed)
Keith Young's reason for visit today is for an injection and labs as scheduled per MD orders.  Labs were drawn prior to administration of ordered medication.    Keith Young also received aranesp injection 50 mcg per MD orders; see [facility] for administration details.  Keith Young tolerated all procedures well and without incident; questions were answered and patient was discharged.

## 2015-01-21 NOTE — Patient Instructions (Signed)
[place] at [facility] Discharge Instructions  RECOMMENDATIONS MADE BY THE CONSULTANT AND ANY TEST RESULTS WILL BE SENT TO YOUR REFERRING PHYSICIAN.  aranesp given today Hemoglobin 9 Return as scheduled Please call the clinic if you have any questions or concerns  Thank you for choosing [place] at [facility] to provide your oncology and hematology care.  To afford each patient quality time with our provider, please arrive at least 15 minutes before your scheduled appointment time.    You need to re-schedule your appointment should you arrive 10 or more minutes late.  We strive to give you quality time with our providers, and arriving late affects you and other patients whose appointments are after yours.  Also, if you no show three or more times for appointments you may be dismissed from the clinic at the providers discretion.     Again, thank you for choosing [facility].  Our hope is that these requests will decrease the amount of time that you wait before being seen by our physicians.       _____________________________________________________________  Should you have questions after your visit to [facility], please contact our office at [phone] between the hours of 8:30 a.m. and 4:30 p.m.  Voicemails left after 4:30 p.m. will not be returned until the following business day.  For prescription refill requests, have your pharmacy contact our office.

## 2015-01-22 ENCOUNTER — Encounter: Payer: Self-pay | Admitting: Internal Medicine

## 2015-01-22 ENCOUNTER — Ambulatory Visit (INDEPENDENT_AMBULATORY_CARE_PROVIDER_SITE_OTHER): Payer: Medicare HMO | Admitting: Internal Medicine

## 2015-01-22 VITALS — BP 96/58 | HR 80 | Ht 62.0 in | Wt 124.0 lb

## 2015-01-22 DIAGNOSIS — I10 Essential (primary) hypertension: Secondary | ICD-10-CM

## 2015-01-22 DIAGNOSIS — I5023 Acute on chronic systolic (congestive) heart failure: Secondary | ICD-10-CM

## 2015-01-22 DIAGNOSIS — Z95810 Presence of automatic (implantable) cardiac defibrillator: Secondary | ICD-10-CM | POA: Insufficient documentation

## 2015-01-22 DIAGNOSIS — I5022 Chronic systolic (congestive) heart failure: Secondary | ICD-10-CM | POA: Diagnosis not present

## 2015-01-22 LAB — CUP PACEART INCLINIC DEVICE CHECK
Date Time Interrogation Session: 20160825143646
HighPow Impedance: 49 Ohm
Lead Channel Impedance Value: 381 Ohm
Lead Channel Pacing Threshold Amplitude: 3 V
Lead Channel Pacing Threshold Pulse Width: 0.3 ms
Lead Channel Sensing Intrinsic Amplitude: 7.5 mV
Lead Channel Setting Pacing Amplitude: 3 V
Lead Channel Setting Pacing Pulse Width: 1.2 ms
Lead Channel Setting Sensing Sensitivity: 0.6 mV
MDC IDC STAT BRADY RV PERCENT PACED: 1 % — AB
Pulse Gen Serial Number: 436636
Zone Setting Detection Interval: 300 ms

## 2015-01-22 NOTE — Progress Notes (Signed)
HPI Keith Young returns today for followup. He is a pleasant 64 yo man with chronic systolic heart failure, non-ischemic CM, EF15%, chronic renal insufficiency and a recent diagnosis of multiple myeloma. He started chemotherapy this week. He denies chest pain or sob. No ICD therapies. No CHF admits. No edema.  No Known Allergies   Current Outpatient Prescriptions  Medication Sig Dispense Refill  . aspirin 81 MG EC tablet Take 1 tablet (81 mg total) by mouth daily.    Marland Kitchen atorvastatin (LIPITOR) 80 MG tablet Take 80 mg by mouth daily.    . BD INSULIN SYRINGE ULTRAFINE 31G X 5/16" 0.3 ML MISC     . carvedilol (COREG) 25 MG tablet Take 12.5 mg by mouth 2 (two) times daily with a meal.     . Cholecalciferol (VITAMIN D3) 2000 UNITS TABS Take 2,000 mg by mouth daily.    Marland Kitchen DIGOX 125 MCG tablet TAKE 1/2 TABLET (0.0625MG) BY MOUTH ONCE DAILY. 15 tablet 5  . ferrous sulfate 325 (65 FE) MG tablet Take 325 mg by mouth daily with breakfast.    . furosemide (LASIX) 40 MG tablet TAKE 1 TABLET BY MOUTH ONCE A DAY. 30 tablet 3  . hydrALAZINE (APRESOLINE) 25 MG tablet Take 3 tablets by mouth 3 (three) times daily.    . insulin lispro (HUMALOG) 100 UNIT/ML injection Inject 2-6 Units into the skin 3 (three) times daily with meals.    . isosorbide dinitrate (ISORDIL) 20 MG tablet Take 2 tablets by mouth 3 (three) times daily.    Marland Kitchen losartan (COZAAR) 25 MG tablet TAKE 1 TABLET BY MOUTH ONCE A DAY. 30 tablet 3  . NOVOLOG 100 UNIT/ML injection     . pantoprazole (PROTONIX) 40 MG tablet TAKE 1 TABLET BY MOUTH ONCE A DAY. 30 tablet 5  . REA LO 40 40 % CREA   3  . traMADol (ULTRAM) 50 MG tablet 50 mg every 6 (six) hours as needed.      No current facility-administered medications for this visit.     Past Medical History  Diagnosis Date  . Chronic systolic heart failure     NYHA Class III  . Type 2 diabetes mellitus   . Nonischemic cardiomyopathy     EF 15-20%  . Essential hypertension, benign   . CKD  (chronic kidney disease) stage 3, GFR 30-59 ml/min   . Anemia   . Hyperlipidemia   . History of stroke   . Bell's palsy   . Cardiomyopathy   . Aortic insufficiency   . Alcoholism in remission   . Noncompliance with medication regimen   . NSVT (nonsustained ventricular tachycardia)   . Cardiac defibrillator in situ   . COPD (chronic obstructive pulmonary disease)   . CVA (cerebral infarction) 01/2013  . Productive cough 08/2013    With brown sputum   . SOB (shortness of breath) on exertion 08/2013  . Numbness of right jaw     Had a stoke in 01/2013. Numbness is occasional, especially when trying to chew.  . At moderate risk for fall   . AICD (automatic cardioverter/defibrillator) present   . Stroke     weakness of right side from CVA  . Arthritis     ROS:   All systems reviewed and negative except as noted in the HPI.   Past Surgical History  Procedure Laterality Date  . Cardiac defibrillator placement  11/12/12    [employer] Inogen MINI ICD implanted in [name] at  [facility]  . Cardiac surgery      bioprostetic AVR per Dr Nelly Laurence note  . Colonoscopy with propofol N/A 04/10/2014    RMR: Colonic Diverticulosis  . Esophagogastroduodenoscopy (egd) with propofol N/A 04/10/2014    RMR: Mild erosive reflux esophagitis. Multiple antral polyps likely hyperplastic status post removal by hot snare cautery technique. Diffusely abnormal stomach status post gastric biopsy I suspect some of patients anemaia may be due to intermittent oozing from the stomach. It would be difficult and a risky proposition to attempt complete removal of all of his gastric polyps.  . Polypectomy N/A 04/10/2014    Procedure: GASTRIC POLYPECTOMY;  Surgeon: Daneil Dolin, MD;  Location: [facility];  Service: Endoscopy;  Laterality: N/A;  . Esophageal biopsy N/A 04/10/2014    Procedure: GASTRIC BIOPSY;  Surgeon: Daneil Dolin, MD;  Location: [facility];  Service: Endoscopy;  Laterality: N/A;  . Right  heart catheterization N/A 02/06/2014    Procedure: RIGHT HEART CATH;  Surgeon: Larey Dresser, MD;  Location: [facility] CATH LAB;  Service: Cardiovascular;  Laterality: N/A;     Family History  Problem Relation Age of Onset  . Kidney disease Mother   . Kidney disease Sister   . Colon cancer Neg Hx      Social History   Social History  . Marital Status: Single    Spouse Name: N/A  . Number of Children: N/A  . Years of Education: N/A   Occupational History  . Not on file.   Social History Main Topics  . Smoking status: Former Smoker    Types: Cigarettes    Quit date: 11/05/1993  . Smokeless tobacco: Never Used  . Alcohol Use: Yes     Comment: former heavy ETOH, none recently  . Drug Use: No     Comment: prior history of cocaine use, smoking   . Sexual Activity: Yes    Birth Control/ Protection: None   Other Topics Concern  . Not on file   Social History Narrative   Recently moved from [place] to be with family in [place]                 BP 96/58 mmHg  Pulse 80  Ht '5\' 2"'  (1.575 m)  Wt 124 lb (56.246 kg)  BMI 22.67 kg/m2  SpO2 98%  Physical Exam:  Well appearing NAD HEENT: Unremarkable Neck:  6 cm JVD, no thyromegally Back:  No CVA tenderness Lungs:  Clear with no wheezes HEART:  Regular rate rhythm, no murmurs, no rubs, no clicks Abd:  soft, positive bowel sounds, no organomegally, no rebound, no guarding Ext:  2 plus pulses, no edema, no cyanosis, no clubbing Skin:  No rashes no nodules Neuro:  CN II through XII intact, motor grossly intact  EKG - NSR  DEVICE  Normal device function.  See PaceArt for details.   Assess/Plan:

## 2015-01-22 NOTE — Patient Instructions (Signed)
Your physician wants you to follow-up in: 12 months with Dr. Lovena Le. You will receive a reminder letter in the mail two months in advance. If you don't receive a letter, please call our office to schedule the follow-up appointment.  Remote monitoring is used to monitor your Pacemaker of ICD from home. This monitoring reduces the number of office visits required to check your device to one time per year. It allows [place] to keep an eye on the functioning of your device to ensure it is working properly. You are scheduled for a device check from home on 04/27/15. You may send your transmission at any time that day. If you have a wireless device, the transmission will be sent automatically. After your physician reviews your transmission, you will receive a postcard with your next transmission date.  Your physician recommends that you continue on your current medications as directed. Please refer to the Current Medication list given to you today.  Thank you for choosing [place]!

## 2015-01-22 NOTE — Assessment & Plan Note (Signed)
His blood pressure is on the low side. He is on maximal medical therapy for chronic systolic heart failure. He is asymptomatic. No change in meds.

## 2015-01-22 NOTE — Addendum Note (Signed)
Addended by: Julian Hy T on: 01/22/2015 09:48 AM   Modules accepted: Orders

## 2015-01-22 NOTE — Assessment & Plan Note (Signed)
His Boston single chamber ICD is working normally. Will recheck in several months.

## 2015-01-22 NOTE — Assessment & Plan Note (Signed)
His symptoms are class 2. He will continue his current meds. I have encouraged him to maintain a low sodium diet.

## 2015-02-10 ENCOUNTER — Encounter (HOSPITAL_COMMUNITY): Payer: Medicare HMO

## 2015-02-10 ENCOUNTER — Ambulatory Visit (HOSPITAL_COMMUNITY): Payer: Medicare HMO | Admitting: Hematology & Oncology

## 2015-02-10 ENCOUNTER — Encounter (HOSPITAL_COMMUNITY): Payer: Medicare HMO | Attending: Hematology & Oncology

## 2015-02-10 ENCOUNTER — Encounter (HOSPITAL_BASED_OUTPATIENT_CLINIC_OR_DEPARTMENT_OTHER): Payer: Medicare HMO | Admitting: Oncology

## 2015-02-10 VITALS — BP 99/62 | HR 66 | Temp 97.9°F | Resp 16 | Wt 124.6 lb

## 2015-02-10 DIAGNOSIS — D631 Anemia in chronic kidney disease: Secondary | ICD-10-CM

## 2015-02-10 DIAGNOSIS — N189 Chronic kidney disease, unspecified: Secondary | ICD-10-CM | POA: Diagnosis not present

## 2015-02-10 DIAGNOSIS — N183 Chronic kidney disease, stage 3 (moderate): Secondary | ICD-10-CM

## 2015-02-10 DIAGNOSIS — D649 Anemia, unspecified: Secondary | ICD-10-CM | POA: Diagnosis not present

## 2015-02-10 LAB — CBC
HCT: 29.5 % — ABNORMAL LOW (ref 39.0–52.0)
Hemoglobin: 9.7 g/dL — ABNORMAL LOW (ref 13.0–17.0)
MCH: 29.8 pg (ref 26.0–34.0)
MCHC: 32.9 g/dL (ref 30.0–36.0)
MCV: 90.8 fL (ref 78.0–100.0)
PLATELETS: 127 10*3/uL — AB (ref 150–400)
RBC: 3.25 MIL/uL — ABNORMAL LOW (ref 4.22–5.81)
RDW: 14 % (ref 11.5–15.5)
WBC: 2.4 10*3/uL — ABNORMAL LOW (ref 4.0–10.5)

## 2015-02-10 MED ORDER — DARBEPOETIN ALFA 60 MCG/0.3ML IJ SOSY
PREFILLED_SYRINGE | INTRAMUSCULAR | Status: AC
  Filled 2015-02-10: qty 0.3

## 2015-02-10 MED ORDER — DARBEPOETIN ALFA 60 MCG/0.3ML IJ SOSY
50.0000 ug | PREFILLED_SYRINGE | Freq: Once | INTRAMUSCULAR | Status: AC
  Administered 2015-02-10: 50 ug via SUBCUTANEOUS

## 2015-02-10 NOTE — Patient Instructions (Signed)
[place] at [facility] Discharge Instructions  RECOMMENDATIONS MADE BY THE CONSULTANT AND ANY TEST RESULTS WILL BE SENT TO YOUR REFERRING PHYSICIAN.  Hemoglobin 9.7 today. Aranesp 50 mcg injection given as ordered. Continue lab work and injection every 2 weeks. MD appointment again in 6 weeks.  Return as scheduled.  Thank you for choosing [place] at [facility] to provide your oncology and hematology care.  To afford each patient quality time with our provider, please arrive at least 15 minutes before your scheduled appointment time.    You need to re-schedule your appointment should you arrive 10 or more minutes late.  We strive to give you quality time with our providers, and arriving late affects you and other patients whose appointments are after yours.  Also, if you no show three or more times for appointments you may be dismissed from the clinic at the providers discretion.     Again, thank you for choosing [facility].  Our hope is that these requests will decrease the amount of time that you wait before being seen by our physicians.       _____________________________________________________________  Should you have questions after your visit to [facility], please contact our office at [phone] between the hours of 8:30 a.m. and 4:30 p.m.  Voicemails left after 4:30 p.m. will not be returned until the following business day.  For prescription refill requests, have your pharmacy contact our office.

## 2015-02-10 NOTE — Progress Notes (Signed)
Keith Young presents today for injection per MD orders. Aranesp 50 mcg administered SQ in left Abdomen. Administration without incident. Patient tolerated well.  

## 2015-02-10 NOTE — Progress Notes (Signed)
Keith Bogus, MD [place] [id number]  Anemia in chronic kidney disease - Plan: ARANESP TREATMENT CONDITION, SCHEDULING COMMUNICATION INJECTION, SCHEDULING COMMUNICATION LAB, Darbepoetin Alfa (ARANESP) injection 50 mcg, Hemoglobin  Anemia in chronic renal disease - Plan: ARANESP TREATMENT CONDITION, SCHEDULING COMMUNICATION INJECTION, SCHEDULING COMMUNICATION LAB, Darbepoetin Alfa (ARANESP) injection 50 mcg, Hemoglobin  CURRENT THERAPY: Aranesp 50 mcg every 2 weeks, beginning on 01/21/2015.  INTERVAL HISTORY: Keith Young 64 y.o. male returns for followup of anemia in the setting of Stage III chronic renal disease followed by Dr. Theador Hawthorne with an element of iron deficiency anemia and an element of anemia of chronic disease in the setting of an elevated ESR.   I personally reviewed and went over laboratory results with the patient.  The results are noted within this dictation.  His Hgb has improved with ESA therapy, and his Hgb is 9.7 g/dL compared to 9.0 g/dL at the start of ESA therapy.  He denies any complaints today.  He understands the treatment plan moving forward.  Past Medical History  Diagnosis Date  . Chronic systolic heart failure     NYHA Class III  . Type 2 diabetes mellitus   . Nonischemic cardiomyopathy     EF 15-20%  . Essential hypertension, benign   . CKD (chronic kidney disease) stage 3, GFR 30-59 ml/min   . Anemia   . Hyperlipidemia   . History of stroke   . Bell's palsy   . Cardiomyopathy   . Aortic insufficiency   . Alcoholism in remission   . Noncompliance with medication regimen   . NSVT (nonsustained ventricular tachycardia)   . Cardiac defibrillator in situ   . COPD (chronic obstructive pulmonary disease)   . CVA (cerebral infarction) 01/2013  . Productive cough 08/2013    With brown sputum   . SOB (shortness of breath) on exertion 08/2013  . Numbness of right jaw     Had a stoke in 01/2013. Numbness is  occasional, especially when trying to chew.  . At moderate risk for fall   . AICD (automatic cardioverter/defibrillator) present   . Stroke     weakness of right side from CVA  . Arthritis     has CHF (congestive heart failure), NYHA class III; Cough; CKD (chronic kidney disease); Type II or unspecified type diabetes mellitus without mention of complication, not stated as uncontrolled; Essential hypertension, benign; Hyperkalemia; CAD (coronary artery disease); History of stroke; Anemia of chronic kidney failure; Other and unspecified hyperlipidemia; Acute on chronic systolic heart failure; Carotid bruit; Chronic systolic CHF (congestive heart failure); Elevated LFTs; Anemia in chronic kidney disease; Gastric polyp; Reflux esophagitis; Anemia; and ICD (implantable cardioverter-defibrillator) in place on his problem list.     has No Known Allergies.  Current Outpatient Prescriptions on File Prior to Visit  Medication Sig Dispense Refill  . aspirin 81 MG EC tablet Take 1 tablet (81 mg total) by mouth daily.    Marland Kitchen atorvastatin (LIPITOR) 80 MG tablet Take 80 mg by mouth daily.    . BD INSULIN SYRINGE ULTRAFINE 31G X 5/16" 0.3 ML MISC     . carvedilol (COREG) 25 MG tablet Take 12.5 mg by mouth 2 (two) times daily with a meal.     . Cholecalciferol (VITAMIN D3) 2000 UNITS TABS Take 2,000 mg by mouth daily.    Marland Kitchen DIGOX 125 MCG tablet TAKE 1/2 TABLET (0.0625MG) BY MOUTH ONCE DAILY. 15 tablet 5  .  ferrous sulfate 325 (65 FE) MG tablet Take 325 mg by mouth daily with breakfast.    . furosemide (LASIX) 40 MG tablet TAKE 1 TABLET BY MOUTH ONCE A DAY. 30 tablet 3  . hydrALAZINE (APRESOLINE) 25 MG tablet Take 3 tablets by mouth 3 (three) times daily.    . insulin lispro (HUMALOG) 100 UNIT/ML injection Inject 2-6 Units into the skin 3 (three) times daily with meals.    . isosorbide dinitrate (ISORDIL) 20 MG tablet Take 2 tablets by mouth 3 (three) times daily.    Marland Kitchen losartan (COZAAR) 25 MG tablet TAKE 1  TABLET BY MOUTH ONCE A DAY. 30 tablet 3  . NOVOLOG 100 UNIT/ML injection     . pantoprazole (PROTONIX) 40 MG tablet TAKE 1 TABLET BY MOUTH ONCE A DAY. 30 tablet 5  . REA LO 40 40 % CREA   3  . traMADol (ULTRAM) 50 MG tablet 50 mg every 6 (six) hours as needed.      No current facility-administered medications on file prior to visit.    Past Surgical History  Procedure Laterality Date  . Cardiac defibrillator placement  11/12/12    [employer] Inogen MINI ICD implanted in [place] at [facility]  . Cardiac surgery      bioprostetic AVR per Dr Nelly Laurence note  . Colonoscopy with propofol N/A 04/10/2014    RMR: Colonic Diverticulosis  . Esophagogastroduodenoscopy (egd) with propofol N/A 04/10/2014    RMR: Mild erosive reflux esophagitis. Multiple antral polyps likely hyperplastic status post removal by hot snare cautery technique. Diffusely abnormal stomach status post gastric biopsy I suspect some of patients anemaia may be due to intermittent oozing from the stomach. It would be difficult and a risky proposition to attempt complete removal of all of his gastric polyps.  . Polypectomy N/A 04/10/2014    Procedure: GASTRIC POLYPECTOMY;  Surgeon: Daneil Dolin, MD;  Location: [facility];  Service: Endoscopy;  Laterality: N/A;  . Esophageal biopsy N/A 04/10/2014    Procedure: GASTRIC BIOPSY;  Surgeon: Daneil Dolin, MD;  Location: [facility];  Service: Endoscopy;  Laterality: N/A;  . Right heart catheterization N/A 02/06/2014    Procedure: RIGHT HEART CATH;  Surgeon: Larey Dresser, MD;  Location: [facility] CATH LAB;  Service: Cardiovascular;  Laterality: N/A;    Denies any headaches, dizziness, double vision, fevers, chills, night sweats, nausea, vomiting, diarrhea, constipation, chest pain, heart palpitations, shortness of breath, blood in stool, black tarry stool, urinary pain, urinary burning, urinary frequency, hematuria.   PHYSICAL EXAMINATION  ECOG PERFORMANCE STATUS: 2 -  Symptomatic, <50% confined to bed  Filed Vitals:   02/10/15 1119  BP: 99/62  Pulse: 66  Temp: 97.9 F (36.6 C)  Resp: 16    GENERAL:alert, no distress, well nourished, well developed, comfortable, cooperative, smiling and hard of hearing and demented SKIN: skin color, texture, turgor are normal, no rashes or significant lesions HEAD: Normocephalic, No masses, lesions, tenderness or abnormalities EYES: normal, PERRLA, EOMI, Conjunctiva are pink and non-injected EARS: External ears normal OROPHARYNX:lips, buccal mucosa, and tongue normal and mucous membranes are moist  NECK: supple, no adenopathy, thyroid normal size, non-tender, without nodularity, no stridor, non-tender, trachea midline LYMPH:  no palpable lymphadenopathy, no hepatosplenomegaly BREAST:not examined LUNGS: clear to auscultation  HEART: regular rate & rhythm ABDOMEN:abdomen soft, non-tender, normal bowel sounds and no masses or organomegaly BACK: Back symmetric, no curvature. EXTREMITIES:less then 2 second capillary refill, no joint deformities, effusion, or inflammation, no skin discoloration, no cyanosis  NEURO: alert & oriented x 3 with fluent speech, no focal motor/sensory deficits, gait normal    LABORATORY DATA: CBC    Component Value Date/Time   WBC 2.4* 02/10/2015 1131   RBC 3.25* 02/10/2015 1131   RBC 3.04* 10/29/2014 1610   HGB 9.7* 02/10/2015 1131   HCT 29.5* 02/10/2015 1131   PLT 127* 02/10/2015 1131   MCV 90.8 02/10/2015 1131   MCH 29.8 02/10/2015 1131   MCHC 32.9 02/10/2015 1131   RDW 14.0 02/10/2015 1131   LYMPHSABS 1.0 12/15/2014 0852   MONOABS 0.3 12/15/2014 0852   EOSABS 0.1 12/15/2014 0852   BASOSABS 0.0 12/15/2014 0852      Chemistry      Component Value Date/Time   NA 136 10/29/2014 1610   K 3.9 10/29/2014 1610   CL 104 10/29/2014 1610   CO2 27 10/29/2014 1610   BUN 33* 10/29/2014 1610   CREATININE 1.72* 10/29/2014 1610   CREATININE 2.33* 07/09/2014 1145      Component  Value Date/Time   CALCIUM 9.3 10/29/2014 1610   ALKPHOS 72 10/29/2014 1610   AST 34 10/29/2014 1610   ALT 25 10/29/2014 1610   BILITOT 0.4 10/29/2014 1610     Lab Results  Component Value Date   PROT 7.9 10/29/2014   ALBUMINELP 3.0* 10/23/2014   A1GS 0.2 10/23/2014   A2GS 0.6 10/23/2014   BETS 0.8 10/23/2014   GAMS 2.8* 10/23/2014   MSPIKE Not Observed 10/23/2014   SPEI Comment 10/23/2014   SPECOM Comment 10/23/2014   KAPLAMBRATIO 12.17* 01/01/2015     PENDING LABS:   RADIOGRAPHIC STUDIES:  No results found.   PATHOLOGY:    ASSESSMENT AND PLAN:  Anemia in chronic kidney disease Anemia in the setting of Stage III chronic renal disease followed by Dr. Theador Hawthorne with an element of iron deficiency anemia having received IV iron in the past and an element of anemia of chronic disease in the setting of an elevated ESR.   Work-up thus far has revealed diffuse osteopenia, anemia, chronic kidney disease and bone marrow biopsy showing 10% plasma cells but unfortunately monoclonality from his marrow is undeterminable. Cytogenetics were normal.  No M-spike in serum peripherally and no Bence Jones proteinuria on UPEP.  He is currently on ESA therapy consisting of Aranesp 50 mcg every 2 weeks, beginning on 01/21/2015.  He will receive an injection today of Aranesp based upon his hemoglobin result today.  We will continue with ESA support and monitor response to therapy.    Labs every 2 weeks: CBC Labs every 6 weeks: Ferritin  Return in 6 weeks for follow-up.      THERAPY PLAN:  Continue with ESA support for now.  May need to consider repeat BMBX in future.  All questions were answered. The patient knows to call the clinic with any problems, questions or concerns. We can certainly see the patient much sooner if necessary.  Patient and plan discussed with Dr. Ancil Linsey and she is in agreement with the aforementioned.   This note is electronically signed by:  Doy Mince 02/10/2015 12:52 PM

## 2015-02-10 NOTE — Progress Notes (Signed)
Labs drawn

## 2015-02-10 NOTE — Assessment & Plan Note (Signed)
Anemia in the setting of Stage III chronic renal disease followed by Dr. Theador Hawthorne with an element of iron deficiency anemia having received IV iron in the past and an element of anemia of chronic disease in the setting of an elevated ESR.   Work-up thus far has revealed diffuse osteopenia, anemia, chronic kidney disease and bone marrow biopsy showing 10% plasma cells but unfortunately monoclonality from his marrow is undeterminable. Cytogenetics were normal.  No M-spike in serum peripherally and no Bence Jones proteinuria on UPEP.  He is currently on ESA therapy consisting of Aranesp 50 mcg every 2 weeks, beginning on 01/21/2015.  He will receive an injection today of Aranesp based upon his hemoglobin result today.  We will continue with ESA support and monitor response to therapy.    Labs every 2 weeks: CBC Labs every 6 weeks: Ferritin  Return in 6 weeks for follow-up.

## 2015-02-11 ENCOUNTER — Encounter: Payer: Self-pay | Admitting: Internal Medicine

## 2015-02-25 ENCOUNTER — Encounter (HOSPITAL_COMMUNITY): Payer: Self-pay

## 2015-02-25 ENCOUNTER — Encounter (HOSPITAL_BASED_OUTPATIENT_CLINIC_OR_DEPARTMENT_OTHER): Payer: Medicare HMO

## 2015-02-25 ENCOUNTER — Encounter (HOSPITAL_COMMUNITY): Payer: Medicare HMO

## 2015-02-25 VITALS — BP 101/59 | HR 62 | Temp 98.1°F | Resp 16

## 2015-02-25 DIAGNOSIS — N189 Chronic kidney disease, unspecified: Secondary | ICD-10-CM

## 2015-02-25 DIAGNOSIS — D631 Anemia in chronic kidney disease: Secondary | ICD-10-CM | POA: Diagnosis not present

## 2015-02-25 LAB — CBC
HCT: 33.7 % — ABNORMAL LOW (ref 39.0–52.0)
Hemoglobin: 10.8 g/dL — ABNORMAL LOW (ref 13.0–17.0)
MCH: 29.4 pg (ref 26.0–34.0)
MCHC: 32 g/dL (ref 30.0–36.0)
MCV: 91.8 fL (ref 78.0–100.0)
PLATELETS: 122 10*3/uL — AB (ref 150–400)
RBC: 3.67 MIL/uL — ABNORMAL LOW (ref 4.22–5.81)
RDW: 13.7 % (ref 11.5–15.5)
WBC: 2.7 10*3/uL — ABNORMAL LOW (ref 4.0–10.5)

## 2015-02-25 MED ORDER — DARBEPOETIN ALFA 60 MCG/0.3ML IJ SOSY
PREFILLED_SYRINGE | INTRAMUSCULAR | Status: AC
  Filled 2015-02-25: qty 0.3

## 2015-02-25 MED ORDER — DARBEPOETIN ALFA 60 MCG/0.3ML IJ SOSY
50.0000 ug | PREFILLED_SYRINGE | Freq: Once | INTRAMUSCULAR | Status: AC
  Administered 2015-02-25: 50 ug via SUBCUTANEOUS

## 2015-02-25 NOTE — Progress Notes (Signed)
Labs drawn

## 2015-02-25 NOTE — Progress Notes (Signed)
Keith Young presents today for injection per the provider's orders.  Aranesp administration without incident; see MAR for injection details.  Patient tolerated procedure well and without incident.  No questions or complaints noted at this time.

## 2015-02-25 NOTE — Patient Instructions (Signed)
[place] at [facility] Discharge Instructions  RECOMMENDATIONS MADE BY THE CONSULTANT AND ANY TEST RESULTS WILL BE SENT TO YOUR REFERRING PHYSICIAN.  Aranesp today. Return as scheduled for lab work and injections.  Thank you for choosing [place] at [facility] to provide your oncology and hematology care.  To afford each patient quality time with our provider, please arrive at least 15 minutes before your scheduled appointment time.    You need to re-schedule your appointment should you arrive 10 or more minutes late.  We strive to give you quality time with our providers, and arriving late affects you and other patients whose appointments are after yours.  Also, if you no show three or more times for appointments you may be dismissed from the clinic at the providers discretion.     Again, thank you for choosing [facility].  Our hope is that these requests will decrease the amount of time that you wait before being seen by our physicians.       _____________________________________________________________  Should you have questions after your visit to [facility], please contact our office at [phone] between the hours of 8:30 a.m. and 4:30 p.m.  Voicemails left after 4:30 p.m. will not be returned until the following business day.  For prescription refill requests, have your pharmacy contact our office.

## 2015-03-11 ENCOUNTER — Encounter (HOSPITAL_COMMUNITY): Payer: Medicare HMO | Attending: Hematology & Oncology

## 2015-03-11 ENCOUNTER — Encounter (HOSPITAL_COMMUNITY): Payer: Medicare HMO | Attending: Oncology

## 2015-03-11 DIAGNOSIS — N189 Chronic kidney disease, unspecified: Secondary | ICD-10-CM | POA: Diagnosis not present

## 2015-03-11 DIAGNOSIS — D631 Anemia in chronic kidney disease: Secondary | ICD-10-CM

## 2015-03-11 DIAGNOSIS — D649 Anemia, unspecified: Secondary | ICD-10-CM | POA: Diagnosis not present

## 2015-03-11 LAB — RENAL FUNCTION PANEL
ALBUMIN: 3.1 g/dL — AB (ref 3.5–5.0)
Anion gap: 4 — ABNORMAL LOW (ref 5–15)
BUN: 29 mg/dL — AB (ref 6–20)
CHLORIDE: 106 mmol/L (ref 101–111)
CO2: 28 mmol/L (ref 22–32)
CREATININE: 1.7 mg/dL — AB (ref 0.61–1.24)
Calcium: 9.4 mg/dL (ref 8.9–10.3)
GFR calc Af Amer: 48 mL/min — ABNORMAL LOW (ref >60)
GFR, EST NON AFRICAN AMERICAN: 41 mL/min — AB (ref >60)
GLUCOSE: 123 mg/dL — AB (ref 65–99)
Phosphorus: 3.2 mg/dL (ref 2.5–4.6)
Potassium: 4 mmol/L (ref 3.5–5.1)
Sodium: 138 mmol/L (ref 135–145)

## 2015-03-11 LAB — CBC
HCT: 35.2 % — ABNORMAL LOW (ref 39.0–52.0)
HCT: 36 % — ABNORMAL LOW (ref 39.0–52.0)
Hemoglobin: 11.5 g/dL — ABNORMAL LOW (ref 13.0–17.0)
Hemoglobin: 11.5 g/dL — ABNORMAL LOW (ref 13.0–17.0)
MCH: 29.8 pg (ref 26.0–34.0)
MCH: 29.3 pg (ref 26.0–34.0)
MCHC: 31.9 g/dL (ref 30.0–36.0)
MCHC: 32.7 g/dL (ref 30.0–36.0)
MCV: 91.2 fL (ref 78.0–100.0)
MCV: 91.8 fL (ref 78.0–100.0)
PLATELETS: 122 10*3/uL — AB (ref 150–400)
Platelets: 121 10*3/uL — ABNORMAL LOW (ref 150–400)
RBC: 3.86 MIL/uL — ABNORMAL LOW (ref 4.22–5.81)
RBC: 3.92 MIL/uL — ABNORMAL LOW (ref 4.22–5.81)
RDW: 13.8 % (ref 11.5–15.5)
RDW: 13.8 % (ref 11.5–15.5)
WBC: 2.6 10*3/uL — AB (ref 4.0–10.5)
WBC: 2.5 10*3/uL — AB (ref 4.0–10.5)

## 2015-03-11 LAB — IRON AND TIBC
IRON: 45 ug/dL (ref 45–182)
Saturation Ratios: 21 % (ref 17.9–39.5)
TIBC: 210 ug/dL — ABNORMAL LOW (ref 250–450)
UIBC: 165 ug/dL

## 2015-03-11 LAB — PROTEIN / CREATININE RATIO, URINE
CREATININE, URINE: 127.15 mg/dL
PROTEIN CREATININE RATIO: 0.53 mg/mg{creat} — AB (ref 0.00–0.15)
Total Protein, Urine: 68 mg/dL

## 2015-03-11 LAB — FERRITIN: FERRITIN: 149 ng/mL (ref 24–336)

## 2015-03-11 NOTE — Progress Notes (Signed)
Keith Young's reason for visit today is for labs as scheduled per MD orders.  Venipuncture performed with a 23 gauge butterfly needle to R Antecubital.  Keith Young tolerated procedure well and without incident; questions were answered and patient was discharged.

## 2015-03-11 NOTE — Progress Notes (Signed)
Hemoglobin 11.5 no injection needed today

## 2015-03-12 LAB — PTH, INTACT AND CALCIUM
CALCIUM TOTAL (PTH): 9.2 mg/dL (ref 8.6–10.2)
PTH: 17 pg/mL (ref 15–65)

## 2015-03-12 LAB — VITAMIN D 25 HYDROXY (VIT D DEFICIENCY, FRACTURES): Vit D, 25-Hydroxy: 56.7 ng/mL (ref 30.0–100.0)

## 2015-03-24 NOTE — Assessment & Plan Note (Addendum)
Anemia in the setting of Stage III chronic renal disease followed by Dr. Theador Hawthorne with an element of iron deficiency anemia having received IV iron in the past and an element of anemia of chronic disease in the setting of an elevated ESR.   Work-up thus far has revealed diffuse osteopenia, anemia, chronic kidney disease and bone marrow biopsy showing 10% plasma cells but unfortunately monoclonality from his marrow is undeterminable. Cytogenetics were normal.  No M-spike in serum peripherally and no Bence Jones proteinuria on UPEP.  He is currently on ESA therapy consisting of Aranesp 50 mcg every 2 weeks, beginning on 01/21/2015.  He will receive an injection today of Aranesp based upon his hemoglobin result today.  We will continue with ESA support and monitor response to therapy.    Labs every 2 weeks: CBC Labs every 6 weeks: Ferritin  His anemia of chronic renal disease does not explain his persistent thrombocytopenia.  We may need to consider a repeat bone marrow aspiration and biopsy in the future.  Return in 12 weeks for follow-up.

## 2015-03-24 NOTE — Progress Notes (Signed)
[name], MD [place] [address]  Anemia in chronic kidney disease - Plan: ARANESP TREATMENT CONDITION, SCHEDULING COMMUNICATION INJECTION, SCHEDULING COMMUNICATION LAB, Darbepoetin Alfa (ARANESP) injection 50 mcg  Anemia in chronic renal disease - Plan: ARANESP TREATMENT CONDITION, SCHEDULING COMMUNICATION INJECTION, SCHEDULING COMMUNICATION LAB, Darbepoetin Alfa (ARANESP) injection 50 mcg  CURRENT THERAPY: Aranesp 50 mcg every 2 weeks, beginning on 01/21/2015.  INTERVAL HISTORY: Keith Young 63 y.o. male returns for followup of anemia in the setting of Stage III chronic renal disease followed by Dr. Theador Hawthorne with an element of iron deficiency anemia and an element of anemia of chronic disease in the setting of an elevated ESR.   I personally reviewed and went over laboratory results with the patient.  The results are noted within this dictation.  He has experienced an excellent response to ESA therapy.  Hgb has been 11.5 g/dL on 10/12, resulting in holding Aranesp. Will update labs today.  He denies any complaints today.  He understands the treatment plan moving forward.    He reports that he feels better since starting Aranesp.  He notes that his fatigue is improved.  He denies any GI blood loss.  He denies any black stool and hematuria.  He denies any hemoptysis as well.  Past Medical History  Diagnosis Date  . Chronic systolic heart failure (HCC)     NYHA Class III  . Type 2 diabetes mellitus ([place])   . Nonischemic cardiomyopathy (HCC)     EF 15-20%  . Essential hypertension, benign   . CKD (chronic kidney disease) stage 3, GFR 30-59 ml/min   . Anemia   . Hyperlipidemia   . History of stroke   . Bell's palsy   . Cardiomyopathy ([place])   . Aortic insufficiency   . Alcoholism in remission ([place])   . Noncompliance with medication regimen   . NSVT (nonsustained ventricular tachycardia) ([place])   . Cardiac defibrillator in situ     . COPD (chronic obstructive pulmonary disease) ([place])   . CVA (cerebral infarction) 01/2013  . Productive cough 08/2013    With brown sputum   . SOB (shortness of breath) on exertion 08/2013  . Numbness of right jaw     Had a stoke in 01/2013. Numbness is occasional, especially when trying to chew.  . At moderate risk for fall   . AICD (automatic cardioverter/defibrillator) present   . Stroke [facility])     weakness of right side from CVA  . Arthritis     has CHF (congestive heart failure), NYHA class III ([place]); Cough; CKD (chronic kidney disease); Type II or unspecified type diabetes mellitus without mention of complication, not stated as uncontrolled; Essential hypertension, benign; Hyperkalemia; CAD (coronary artery disease); History of stroke; Anemia of chronic kidney failure; Other and unspecified hyperlipidemia; Acute on chronic systolic heart failure ([place]); Carotid bruit; Chronic systolic CHF (congestive heart failure) ([place]); Elevated LFTs; Anemia in chronic kidney disease; Gastric polyp; Reflux esophagitis; Anemia; and ICD (implantable cardioverter-defibrillator) in place on his problem list.     has No Known Allergies.  Current Outpatient Prescriptions on File Prior to Visit  Medication Sig Dispense Refill  . aspirin 81 MG EC tablet Take 1 tablet (81 mg total) by mouth daily.    Marland Kitchen atorvastatin (LIPITOR) 80 MG tablet Take 80 mg by mouth daily.    . carvedilol (COREG) 25 MG tablet Take 12.5 mg by mouth 2 (two) times daily with  a meal.     . Cholecalciferol (VITAMIN D3) 2000 UNITS TABS Take 2,000 mg by mouth daily.    . ferrous sulfate 325 (65 FE) MG tablet Take 325 mg by mouth daily with breakfast.    . hydrALAZINE (APRESOLINE) 25 MG tablet Take 3 tablets by mouth 3 (three) times daily.    . insulin lispro (HUMALOG) 100 UNIT/ML injection Inject 2-6 Units into the skin 3 (three) times daily with meals.    . isosorbide dinitrate (ISORDIL) 20 MG tablet Take 2 tablets by mouth 3 (three) times  daily.    Marland Kitchen losartan (COZAAR) 25 MG tablet TAKE 1 TABLET BY MOUTH ONCE A DAY. 30 tablet 3  . NOVOLOG 100 UNIT/ML injection     . pantoprazole (PROTONIX) 40 MG tablet TAKE 1 TABLET BY MOUTH ONCE A DAY. 30 tablet 5  . REA LO 40 40 % CREA   3  . traMADol (ULTRAM) 50 MG tablet 50 mg every 6 (six) hours as needed.     . BD INSULIN SYRINGE ULTRAFINE 31G X 5/16" 0.3 ML MISC      No current facility-administered medications on file prior to visit.    Past Surgical History  Procedure Laterality Date  . Cardiac defibrillator placement  11/12/12    [employer] Inogen MINI ICD implanted in [place] at [facility]  . Cardiac surgery      bioprostetic AVR per Dr Nelly [place] note  . Colonoscopy with propofol N/A 04/10/2014    RMR: Colonic Diverticulosis  . Esophagogastroduodenoscopy (egd) with propofol N/A 04/10/2014    RMR: Mild erosive reflux esophagitis. Multiple antral polyps likely hyperplastic status post removal by hot snare cautery technique. Diffusely abnormal stomach status post gastric biopsy I suspect some of patients anemaia may be due to intermittent oozing from the stomach. It would be difficult and a risky proposition to attempt complete removal of all of his gastric polyps.  . Polypectomy N/A 04/10/2014    Procedure: GASTRIC POLYPECTOMY;  Surgeon: Daneil Dolin, MD;  Location: [facility];  Service: Endoscopy;  Laterality: N/A;  . Esophageal biopsy N/A 04/10/2014    Procedure: GASTRIC BIOPSY;  Surgeon: Daneil Dolin, MD;  Location: [facility];  Service: Endoscopy;  Laterality: N/A;  . Right heart catheterization N/A 02/06/2014    Procedure: RIGHT HEART CATH;  Surgeon: Larey Dresser, MD;  Location: [facility] CATH LAB;  Service: Cardiovascular;  Laterality: N/A;    Denies any headaches, dizziness, double vision, fevers, chills, night sweats, nausea, vomiting, diarrhea, constipation, chest pain, heart palpitations, shortness of breath, blood in stool, black tarry stool, urinary pain,  urinary burning, urinary frequency, hematuria.   PHYSICAL EXAMINATION  ECOG PERFORMANCE STATUS: 2 - Symptomatic, <50% confined to bed  Filed Vitals:   03/25/15 1300  BP: 137/70  Pulse: 65  Temp: 97.5 F (36.4 C)  Resp: 18    GENERAL:alert, no distress, well nourished, well developed, comfortable, cooperative, smiling and hard of hearing and demented, unaccompanied today. SKIN: skin color, texture, turgor are normal, no rashes or significant lesions HEAD: Normocephalic, No masses, lesions, tenderness or abnormalities EYES: normal, PERRLA, EOMI, Conjunctiva are pink and non-injected EARS: External ears normal OROPHARYNX:lips, buccal mucosa, and tongue normal and mucous membranes are moist  NECK: supple, no adenopathy, thyroid normal size, non-tender, without nodularity, no stridor, non-tender, trachea midline LYMPH:  no palpable lymphadenopathy BREAST:not examined LUNGS: clear to auscultation and percussion HEART: regular rate & rhythm with a systolic murmur, 2/6, heard on left and right sternal border.  ABDOMEN:abdomen soft, non-tender, normal bowel sounds and no masses or organomegaly BACK: Back symmetric, no curvature. EXTREMITIES:less then 2 second capillary refill, no joint deformities, effusion, or inflammation, no skin discoloration, no cyanosis  NEURO: alert & oriented x 3 with fluent speech, no focal motor/sensory deficits, gait normal    LABORATORY DATA: CBC    Component Value Date/Time   WBC 2.3* 03/25/2015 1310   RBC 3.81* 03/25/2015 1310   RBC 3.04* 10/29/2014 1610   HGB 10.9* 03/25/2015 1310   HCT 34.6* 03/25/2015 1310   PLT 118* 03/25/2015 1310   MCV 90.8 03/25/2015 1310   MCH 28.6 03/25/2015 1310   MCHC 31.5 03/25/2015 1310   RDW 13.1 03/25/2015 1310   LYMPHSABS 1.0 12/15/2014 0852   MONOABS 0.3 12/15/2014 0852   EOSABS 0.1 12/15/2014 0852   BASOSABS 0.0 12/15/2014 0852      Chemistry      Component Value Date/Time   NA 138 03/11/2015 1429   K 4.0  03/11/2015 1429   CL 106 03/11/2015 1429   CO2 28 03/11/2015 1429   BUN 29* 03/11/2015 1429   CREATININE 1.70* 03/11/2015 1429   CREATININE 2.33* 07/09/2014 1145      Component Value Date/Time   CALCIUM 9.4 03/11/2015 1429   CALCIUM 9.2 03/11/2015 1428   ALKPHOS 72 10/29/2014 1610   AST 34 10/29/2014 1610   ALT 25 10/29/2014 1610   BILITOT 0.4 10/29/2014 1610     Lab Results  Component Value Date   PROT 7.9 10/29/2014   ALBUMINELP 3.0* 10/23/2014   A1GS 0.2 10/23/2014   A2GS 0.6 10/23/2014   BETS 0.8 10/23/2014   GAMS 2.8* 10/23/2014   MSPIKE Not Observed 10/23/2014   SPEI Comment 10/23/2014   SPECOM Comment 10/23/2014   KAPLAMBRATIO 12.17* 01/01/2015     PENDING LABS:   RADIOGRAPHIC STUDIES:  No results found.   PATHOLOGY:    ASSESSMENT AND PLAN:  Anemia in chronic kidney disease Anemia in the setting of Stage III chronic renal disease followed by Dr. Theador Hawthorne with an element of iron deficiency anemia having received IV iron in the past and an element of anemia of chronic disease in the setting of an elevated ESR.   Work-up thus far has revealed diffuse osteopenia, anemia, chronic kidney disease and bone marrow biopsy showing 10% plasma cells but unfortunately monoclonality from his marrow is undeterminable. Cytogenetics were normal.  No M-spike in serum peripherally and no Bence Jones proteinuria on UPEP.  He is currently on ESA therapy consisting of Aranesp 50 mcg every 2 weeks, beginning on 01/21/2015.  He will receive an injection today of Aranesp based upon his hemoglobin result today.  We will continue with ESA support and monitor response to therapy.    Labs every 2 weeks: CBC Labs every 6 weeks: Ferritin  Return in 12 weeks for follow-up.    THERAPY PLAN:  Continue with ESA support for now.  May need to consider repeat BMBX in future.  All questions were answered. The patient knows to call the clinic with any problems, questions or concerns. We can  certainly see the patient much sooner if necessary.  Patient and plan discussed with Dr. Ancil Linsey and she is in agreement with the aforementioned.   This note is electronically signed by: Doy Mince 03/25/2015 4:12 PM

## 2015-03-25 ENCOUNTER — Encounter (HOSPITAL_BASED_OUTPATIENT_CLINIC_OR_DEPARTMENT_OTHER): Payer: Medicare HMO

## 2015-03-25 ENCOUNTER — Encounter (HOSPITAL_COMMUNITY): Payer: Self-pay | Admitting: Oncology

## 2015-03-25 ENCOUNTER — Encounter (HOSPITAL_COMMUNITY): Payer: Medicare HMO | Attending: Oncology

## 2015-03-25 ENCOUNTER — Other Ambulatory Visit (HOSPITAL_COMMUNITY): Payer: Medicare HMO

## 2015-03-25 ENCOUNTER — Encounter (HOSPITAL_COMMUNITY): Payer: Medicare HMO

## 2015-03-25 ENCOUNTER — Other Ambulatory Visit: Payer: Self-pay | Admitting: Internal Medicine

## 2015-03-25 ENCOUNTER — Other Ambulatory Visit: Payer: Self-pay | Admitting: Cardiology

## 2015-03-25 ENCOUNTER — Encounter (HOSPITAL_BASED_OUTPATIENT_CLINIC_OR_DEPARTMENT_OTHER): Payer: Medicare HMO | Admitting: Oncology

## 2015-03-25 VITALS — BP 137/70 | HR 65 | Temp 97.5°F | Resp 18 | Wt 128.0 lb

## 2015-03-25 DIAGNOSIS — D631 Anemia in chronic kidney disease: Secondary | ICD-10-CM | POA: Diagnosis not present

## 2015-03-25 DIAGNOSIS — N189 Chronic kidney disease, unspecified: Principal | ICD-10-CM

## 2015-03-25 DIAGNOSIS — N183 Chronic kidney disease, stage 3 (moderate): Secondary | ICD-10-CM | POA: Diagnosis not present

## 2015-03-25 DIAGNOSIS — D638 Anemia in other chronic diseases classified elsewhere: Secondary | ICD-10-CM

## 2015-03-25 DIAGNOSIS — D649 Anemia, unspecified: Secondary | ICD-10-CM | POA: Diagnosis not present

## 2015-03-25 DIAGNOSIS — D509 Iron deficiency anemia, unspecified: Secondary | ICD-10-CM | POA: Diagnosis not present

## 2015-03-25 DIAGNOSIS — D72822 Plasmacytosis: Secondary | ICD-10-CM

## 2015-03-25 LAB — CBC
HEMATOCRIT: 34.6 % — AB (ref 39.0–52.0)
HEMOGLOBIN: 10.9 g/dL — AB (ref 13.0–17.0)
MCH: 28.6 pg (ref 26.0–34.0)
MCHC: 31.5 g/dL (ref 30.0–36.0)
MCV: 90.8 fL (ref 78.0–100.0)
Platelets: 118 10*3/uL — ABNORMAL LOW (ref 150–400)
RBC: 3.81 MIL/uL — AB (ref 4.22–5.81)
RDW: 13.1 % (ref 11.5–15.5)
WBC: 2.3 10*3/uL — AB (ref 4.0–10.5)

## 2015-03-25 MED ORDER — DARBEPOETIN ALFA 60 MCG/0.3ML IJ SOSY
50.0000 ug | PREFILLED_SYRINGE | Freq: Once | INTRAMUSCULAR | Status: AC
  Administered 2015-03-25: 50 ug via SUBCUTANEOUS

## 2015-03-25 MED ORDER — DARBEPOETIN ALFA 60 MCG/0.3ML IJ SOSY
PREFILLED_SYRINGE | INTRAMUSCULAR | Status: AC
  Filled 2015-03-25: qty 0.3

## 2015-03-25 NOTE — Patient Instructions (Addendum)
Keith Young at [facility] Discharge Instructions  RECOMMENDATIONS MADE BY THE CONSULTANT AND ANY TEST RESULTS WILL BE SENT TO YOUR REFERRING PHYSICIAN.  Exam completed by [name] PA Hemoglobin 10.9 Aranesp every 2 weeks with labs Return to see the doctor in 12 weeks Please call the clinic if you have any questions or concerns   Thank you for choosing [facility] [facility] to provide your oncology and hematology care.  To afford each patient quality time with our provider, please arrive at least 15 minutes before your scheduled appointment time.    You need to re-schedule your appointment should you arrive 10 or more minutes late.  We strive to give you quality time with our providers, and arriving late affects you and other patients whose appointments are after yours.  Also, if you no show three or more times for appointments you may be dismissed from the clinic at the providers discretion.     Again, thank you for choosing [facility].  Our hope is that these requests will decrease the amount of time that you wait before being seen by our physicians.       _____________________________________________________________  Should you have questions after your visit to [facility], please contact our office at [phone] between the hours of 8:30 a.m. and 4:30 p.m.  Voicemails left after 4:30 p.m. will not be returned until the following business day.  For prescription refill requests, have your pharmacy contact our office.

## 2015-03-25 NOTE — Progress Notes (Signed)
..  Keith Young presents today for injection per the provider's orders.  aranesp administration without incident; see MAR for injection details.  Patient tolerated procedure well and without incident.  No questions or complaints noted at this time.

## 2015-03-25 NOTE — Progress Notes (Signed)
Keith Young's reason for visit today is for labs as scheduled per MD orders.  Venipuncture performed with a 23 gauge butterfly needle to L Antecubital.  [name] tolerated procedure well and without incident; questions were answered and patient was discharged.

## 2015-04-02 ENCOUNTER — Other Ambulatory Visit (HOSPITAL_COMMUNITY): Payer: Self-pay

## 2015-04-02 DIAGNOSIS — D631 Anemia in chronic kidney disease: Secondary | ICD-10-CM

## 2015-04-02 DIAGNOSIS — N189 Chronic kidney disease, unspecified: Secondary | ICD-10-CM

## 2015-04-08 ENCOUNTER — Encounter (HOSPITAL_COMMUNITY): Payer: Medicare HMO | Attending: Hematology & Oncology

## 2015-04-08 ENCOUNTER — Encounter (HOSPITAL_COMMUNITY): Payer: Medicare HMO | Attending: Oncology

## 2015-04-08 DIAGNOSIS — D631 Anemia in chronic kidney disease: Secondary | ICD-10-CM

## 2015-04-08 DIAGNOSIS — D72822 Plasmacytosis: Secondary | ICD-10-CM

## 2015-04-08 DIAGNOSIS — N183 Chronic kidney disease, stage 3 (moderate): Secondary | ICD-10-CM

## 2015-04-08 DIAGNOSIS — D649 Anemia, unspecified: Secondary | ICD-10-CM | POA: Insufficient documentation

## 2015-04-08 LAB — CBC
HEMATOCRIT: 34.3 % — AB (ref 39.0–52.0)
HEMOGLOBIN: 11.1 g/dL — AB (ref 13.0–17.0)
MCH: 29.1 pg (ref 26.0–34.0)
MCHC: 32.4 g/dL (ref 30.0–36.0)
MCV: 90 fL (ref 78.0–100.0)
Platelets: 111 10*3/uL — ABNORMAL LOW (ref 150–400)
RBC: 3.81 MIL/uL — AB (ref 4.22–5.81)
RDW: 13.6 % (ref 11.5–15.5)
WBC: 2.3 10*3/uL — AB (ref 4.0–10.5)

## 2015-04-08 NOTE — Progress Notes (Signed)
Patient didn't require injection today r/t lab results and treatment parameters.  Patient aware and verbalized understanding.  Return appointment given to patient.

## 2015-04-10 ENCOUNTER — Other Ambulatory Visit: Payer: Self-pay | Admitting: Internal Medicine

## 2015-04-21 ENCOUNTER — Other Ambulatory Visit (HOSPITAL_COMMUNITY): Payer: Medicare HMO

## 2015-04-21 ENCOUNTER — Encounter (HOSPITAL_COMMUNITY): Payer: Medicare HMO | Attending: Hematology & Oncology

## 2015-04-21 DIAGNOSIS — D649 Anemia, unspecified: Secondary | ICD-10-CM | POA: Insufficient documentation

## 2015-04-27 ENCOUNTER — Telehealth: Payer: Self-pay | Admitting: Cardiology

## 2015-04-27 ENCOUNTER — Ambulatory Visit (INDEPENDENT_AMBULATORY_CARE_PROVIDER_SITE_OTHER): Payer: Medicare HMO | Admitting: *Deleted

## 2015-04-27 DIAGNOSIS — I429 Cardiomyopathy, unspecified: Secondary | ICD-10-CM | POA: Diagnosis not present

## 2015-04-27 NOTE — Telephone Encounter (Signed)
Attempted to reach pt. Number not valid.

## 2015-04-28 ENCOUNTER — Emergency Department (HOSPITAL_COMMUNITY): Payer: Medicare HMO

## 2015-04-28 ENCOUNTER — Telehealth: Payer: Self-pay | Admitting: Internal Medicine

## 2015-04-28 ENCOUNTER — Observation Stay (HOSPITAL_COMMUNITY)
Admission: EM | Admit: 2015-04-28 | Discharge: 2015-04-29 | Disposition: A | Discharge status: Home / Self Care | Payer: Medicare HMO | Attending: Pulmonary Disease | Admitting: Pulmonary Disease

## 2015-04-28 ENCOUNTER — Encounter (HOSPITAL_COMMUNITY): Payer: Self-pay | Admitting: Emergency Medicine

## 2015-04-28 ENCOUNTER — Telehealth: Payer: Self-pay

## 2015-04-28 DIAGNOSIS — Y658 Other specified misadventures during surgical and medical care: Secondary | ICD-10-CM | POA: Insufficient documentation

## 2015-04-28 DIAGNOSIS — N183 Chronic kidney disease, stage 3 (moderate): Secondary | ICD-10-CM | POA: Insufficient documentation

## 2015-04-28 DIAGNOSIS — Z79899 Other long term (current) drug therapy: Secondary | ICD-10-CM | POA: Insufficient documentation

## 2015-04-28 DIAGNOSIS — R7989 Other specified abnormal findings of blood chemistry: Secondary | ICD-10-CM | POA: Diagnosis not present

## 2015-04-28 DIAGNOSIS — Z87891 Personal history of nicotine dependence: Secondary | ICD-10-CM | POA: Insufficient documentation

## 2015-04-28 DIAGNOSIS — I10 Essential (primary) hypertension: Secondary | ICD-10-CM

## 2015-04-28 DIAGNOSIS — Z7982 Long term (current) use of aspirin: Secondary | ICD-10-CM | POA: Insufficient documentation

## 2015-04-28 DIAGNOSIS — M1990 Unspecified osteoarthritis, unspecified site: Secondary | ICD-10-CM | POA: Insufficient documentation

## 2015-04-28 DIAGNOSIS — R5383 Other fatigue: Secondary | ICD-10-CM

## 2015-04-28 DIAGNOSIS — Z95810 Presence of automatic (implantable) cardiac defibrillator: Secondary | ICD-10-CM

## 2015-04-28 DIAGNOSIS — Z794 Long term (current) use of insulin: Secondary | ICD-10-CM | POA: Insufficient documentation

## 2015-04-28 DIAGNOSIS — R778 Other specified abnormalities of plasma proteins: Secondary | ICD-10-CM | POA: Diagnosis present

## 2015-04-28 DIAGNOSIS — R21 Rash and other nonspecific skin eruption: Secondary | ICD-10-CM | POA: Insufficient documentation

## 2015-04-28 DIAGNOSIS — J449 Chronic obstructive pulmonary disease, unspecified: Secondary | ICD-10-CM | POA: Insufficient documentation

## 2015-04-28 DIAGNOSIS — I2510 Atherosclerotic heart disease of native coronary artery without angina pectoris: Secondary | ICD-10-CM | POA: Diagnosis present

## 2015-04-28 DIAGNOSIS — E119 Type 2 diabetes mellitus without complications: Secondary | ICD-10-CM | POA: Insufficient documentation

## 2015-04-28 DIAGNOSIS — I5022 Chronic systolic (congestive) heart failure: Secondary | ICD-10-CM | POA: Insufficient documentation

## 2015-04-28 DIAGNOSIS — I129 Hypertensive chronic kidney disease with stage 1 through stage 4 chronic kidney disease, or unspecified chronic kidney disease: Secondary | ICD-10-CM | POA: Insufficient documentation

## 2015-04-28 DIAGNOSIS — I429 Cardiomyopathy, unspecified: Secondary | ICD-10-CM | POA: Insufficient documentation

## 2015-04-28 DIAGNOSIS — G510 Bell's palsy: Secondary | ICD-10-CM | POA: Insufficient documentation

## 2015-04-28 DIAGNOSIS — I472 Ventricular tachycardia: Secondary | ICD-10-CM | POA: Insufficient documentation

## 2015-04-28 DIAGNOSIS — E860 Dehydration: Secondary | ICD-10-CM

## 2015-04-28 DIAGNOSIS — R079 Chest pain, unspecified: Secondary | ICD-10-CM | POA: Diagnosis present

## 2015-04-28 DIAGNOSIS — Z8673 Personal history of transient ischemic attack (TIA), and cerebral infarction without residual deficits: Secondary | ICD-10-CM | POA: Insufficient documentation

## 2015-04-28 DIAGNOSIS — E785 Hyperlipidemia, unspecified: Secondary | ICD-10-CM | POA: Insufficient documentation

## 2015-04-28 LAB — CBC WITH DIFFERENTIAL/PLATELET
Basophils Absolute: 0 10*3/uL (ref 0.0–0.1)
Basophils Relative: 1 %
EOS PCT: 1 %
Eosinophils Absolute: 0 10*3/uL (ref 0.0–0.7)
HEMATOCRIT: 35.4 % — AB (ref 39.0–52.0)
Hemoglobin: 11.7 g/dL — ABNORMAL LOW (ref 13.0–17.0)
LYMPHS ABS: 1.1 10*3/uL (ref 0.7–4.0)
LYMPHS PCT: 38 %
MCH: 28.9 pg (ref 26.0–34.0)
MCHC: 33.1 g/dL (ref 30.0–36.0)
MCV: 87.4 fL (ref 78.0–100.0)
MONO ABS: 0.4 10*3/uL (ref 0.1–1.0)
Monocytes Relative: 12 %
NEUTROS ABS: 1.4 10*3/uL — AB (ref 1.7–7.7)
Neutrophils Relative %: 48 %
PLATELETS: 128 10*3/uL — AB (ref 150–400)
RBC: 4.05 MIL/uL — ABNORMAL LOW (ref 4.22–5.81)
RDW: 12.7 % (ref 11.5–15.5)
WBC: 2.9 10*3/uL — ABNORMAL LOW (ref 4.0–10.5)

## 2015-04-28 LAB — URINALYSIS, ROUTINE W REFLEX MICROSCOPIC
BILIRUBIN URINE: NEGATIVE
Glucose, UA: NEGATIVE mg/dL
HGB URINE DIPSTICK: NEGATIVE
Ketones, ur: NEGATIVE mg/dL
Leukocytes, UA: NEGATIVE
Nitrite: NEGATIVE
PH: 5.5 (ref 5.0–8.0)
Protein, ur: 100 mg/dL — AB
SPECIFIC GRAVITY, URINE: 1.02 (ref 1.005–1.030)

## 2015-04-28 LAB — URINE MICROSCOPIC-ADD ON: RBC / HPF: NONE SEEN RBC/hpf (ref 0–5)

## 2015-04-28 LAB — CBG MONITORING, ED
GLUCOSE-CAPILLARY: 55 mg/dL — AB (ref 65–99)
Glucose-Capillary: 117 mg/dL — ABNORMAL HIGH (ref 65–99)

## 2015-04-28 LAB — BRAIN NATRIURETIC PEPTIDE: B NATRIURETIC PEPTIDE 5: 196 pg/mL — AB (ref 0.0–100.0)

## 2015-04-28 LAB — BASIC METABOLIC PANEL
Anion gap: 7 (ref 5–15)
BUN: 54 mg/dL — AB (ref 6–20)
CHLORIDE: 98 mmol/L — AB (ref 101–111)
CO2: 25 mmol/L (ref 22–32)
Calcium: 9.1 mg/dL (ref 8.9–10.3)
Creatinine, Ser: 2.66 mg/dL — ABNORMAL HIGH (ref 0.61–1.24)
GFR calc Af Amer: 28 mL/min — ABNORMAL LOW (ref >60)
GFR calc non Af Amer: 24 mL/min — ABNORMAL LOW (ref >60)
GLUCOSE: 127 mg/dL — AB (ref 65–99)
POTASSIUM: 4.3 mmol/L (ref 3.5–5.1)
Sodium: 130 mmol/L — ABNORMAL LOW (ref 135–145)

## 2015-04-28 LAB — TROPONIN I
TROPONIN I: 0.26 ng/mL — AB (ref <0.031)
Troponin I: 0.3 ng/mL — ABNORMAL HIGH (ref <0.031)
Troponin I: 0.29 ng/mL — ABNORMAL HIGH (ref <0.031)

## 2015-04-28 MED ORDER — ACETAMINOPHEN 325 MG PO TABS: 650.0000 mg | ORAL_TABLET | Freq: Four times a day (QID) | ORAL | Status: DC | PRN

## 2015-04-28 MED ORDER — ONDANSETRON HCL 4 MG/2ML IJ SOLN: 4.0000 mg | Freq: Four times a day (QID) | INTRAMUSCULAR | Status: DC | PRN

## 2015-04-28 MED ORDER — LOSARTAN POTASSIUM 50 MG PO TABS
25.0000 mg | ORAL_TABLET | Freq: Every day | ORAL | Status: DC
  Administered 2015-04-29: 25 mg via ORAL
  Filled 2015-04-28 (×3): qty 1

## 2015-04-28 MED ORDER — ACETAMINOPHEN 650 MG RE SUPP: 650.0000 mg | Freq: Four times a day (QID) | RECTAL | Status: DC | PRN

## 2015-04-28 MED ORDER — PANTOPRAZOLE SODIUM 40 MG PO TBEC
40.0000 mg | DELAYED_RELEASE_TABLET | Freq: Every day | ORAL | Status: DC
  Administered 2015-04-29: 40 mg via ORAL
  Filled 2015-04-28: qty 1

## 2015-04-28 MED ORDER — DIGOXIN 125 MCG PO TABS
62.5000 ug | ORAL_TABLET | Freq: Every day | ORAL | Status: DC
  Administered 2015-04-29: 62.5 ug via ORAL
  Filled 2015-04-28 (×3): qty 1

## 2015-04-28 MED ORDER — ACYCLOVIR 800 MG PO TABS
800.0000 mg | ORAL_TABLET | Freq: Every day | ORAL | Status: DC
  Filled 2015-04-28 (×3): qty 1

## 2015-04-28 MED ORDER — SODIUM CHLORIDE 0.9 % IV SOLN
INTRAVENOUS | Status: DC
  Administered 2015-04-28 – 2015-04-29 (×2): via INTRAVENOUS

## 2015-04-28 MED ORDER — ASPIRIN EC 81 MG PO TBEC
81.0000 mg | DELAYED_RELEASE_TABLET | Freq: Every day | ORAL | Status: DC
  Administered 2015-04-29 (×2): 81 mg via ORAL
  Filled 2015-04-28 (×2): qty 1

## 2015-04-28 MED ORDER — ACYCLOVIR 200 MG PO CAPS
800.0000 mg | ORAL_CAPSULE | Freq: Every day | ORAL | Status: DC
  Administered 2015-04-29 (×2): 800 mg via ORAL
  Filled 2015-04-28 (×3): qty 4

## 2015-04-28 MED ORDER — ENOXAPARIN SODIUM 40 MG/0.4ML ~~LOC~~ SOLN
40.0000 mg | SUBCUTANEOUS | Status: DC
  Administered 2015-04-28: 40 mg via SUBCUTANEOUS
  Filled 2015-04-28: qty 0.4

## 2015-04-28 MED ORDER — ISOSORBIDE DINITRATE 20 MG PO TABS
40.0000 mg | ORAL_TABLET | Freq: Three times a day (TID) | ORAL | Status: DC
  Filled 2015-04-28: qty 2

## 2015-04-28 MED ORDER — ONDANSETRON HCL 4 MG PO TABS: 4.0000 mg | ORAL_TABLET | Freq: Four times a day (QID) | ORAL | Status: DC | PRN

## 2015-04-28 MED ORDER — ATORVASTATIN CALCIUM 40 MG PO TABS
80.0000 mg | ORAL_TABLET | Freq: Every day | ORAL | Status: DC
  Administered 2015-04-29 (×2): 80 mg via ORAL
  Filled 2015-04-28: qty 2
  Filled 2015-04-28 (×2): qty 1
  Filled 2015-04-28: qty 2

## 2015-04-28 MED ORDER — FERROUS SULFATE 325 (65 FE) MG PO TABS
325.0000 mg | ORAL_TABLET | Freq: Every day | ORAL | Status: DC
  Administered 2015-04-29: 325 mg via ORAL
  Filled 2015-04-28: qty 1

## 2015-04-28 MED ORDER — CARVEDILOL 12.5 MG PO TABS
12.5000 mg | ORAL_TABLET | Freq: Two times a day (BID) | ORAL | Status: DC
  Administered 2015-04-29: 12.5 mg via ORAL
  Filled 2015-04-28: qty 1

## 2015-04-28 MED ORDER — HYDRALAZINE HCL 25 MG PO TABS
75.0000 mg | ORAL_TABLET | Freq: Three times a day (TID) | ORAL | Status: DC
  Administered 2015-04-29 (×2): 75 mg via ORAL
  Filled 2015-04-28 (×2): qty 3

## 2015-04-28 MED ORDER — INSULIN ASPART 100 UNIT/ML ~~LOC~~ SOLN: 0.0000 [IU] | Freq: Three times a day (TID) | SUBCUTANEOUS | Status: DC

## 2015-04-28 NOTE — H&P (Addendum)
PCP:   [name], MD   Chief Complaint:  Pacemaker not functioning properly  HPI:  64 year old male who  has a past medical history of Chronic systolic heart failure ([place]); Type 2 diabetes mellitus ([place]); Nonischemic cardiomyopathy ([place]); Essential hypertension, benign; CKD (chronic kidney disease) stage 3, GFR 30-59 ml/min; Anemia; Hyperlipidemia; History of stroke; Bell's palsy; Cardiomyopathy ([place]); Aortic insufficiency; Alcoholism in remission ([place]); Noncompliance with medication regimen; NSVT (nonsustained ventricular tachycardia) ([place]); Cardiac defibrillator in situ; COPD (chronic obstructive pulmonary disease) ([place]); CVA (cerebral infarction) (01/2013); Productive cough (08/2013); SOB (shortness of breath) on exertion (08/2013); Numbness of right jaw; At moderate risk for fall; AICD (automatic cardioverter/defibrillator) present; Stroke [facility]); and Arthritis.  Today was brought to the ED from adult care center for concern for pacemaker not functioning properly. Patient also was complaining of excessively fatigued so brought to the hospital. Patient has a history of nonischemic cardiopathy, C KD stage III was found to be in acute on chronic kidney failure with creatinine 2.60 and baseline creatinine 1.70.Patient had a defibrillator placed in 2014 and does have a pacemaker component however the pacemaker is turned way down so patient actively never uses it. In the ED the defibrillator interrogated and everything seems to be functioning fine. Lab work revealed elevated troponin 0.30 which came down to 0.26.  Patient was recently started on acyclovir for herpes zoster in the right arm by the patient's PCP. Patient currently denies any chest pain, no shortness of breath. No nausea vomiting or diarrhea. No fever no dysuria urgency frequency of urination.   Allergies:  No Known Allergies    Past Medical History  Diagnosis Date  . Chronic systolic heart failure (HCC)     NYHA Class III    . Type 2 diabetes mellitus ([place])   . Nonischemic cardiomyopathy (HCC)     EF 15-20%  . Essential hypertension, benign   . CKD (chronic kidney disease) stage 3, GFR 30-59 ml/min   . Anemia   . Hyperlipidemia   . History of stroke   . Bell's palsy   . Cardiomyopathy ([place])   . Aortic insufficiency   . Alcoholism in remission ([place])   . Noncompliance with medication regimen   . NSVT (nonsustained ventricular tachycardia) ([place])   . Cardiac defibrillator in situ   . COPD (chronic obstructive pulmonary disease) ([place])   . CVA (cerebral infarction) 01/2013  . Productive cough 08/2013    With brown sputum   . SOB (shortness of breath) on exertion 08/2013  . Numbness of right jaw     Had a stoke in 01/2013. Numbness is occasional, especially when trying to chew.  . At moderate risk for fall   . AICD (automatic cardioverter/defibrillator) present   . Stroke [facility])     weakness of right side from CVA  . Arthritis     Past Surgical History  Procedure Laterality Date  . Cardiac defibrillator placement  11/12/12    [employer] Inogen MINI ICD implanted in [place] at [facility]  . Cardiac surgery      bioprostetic AVR per Dr Nelly Laurence note  . Colonoscopy with propofol N/A 04/10/2014    RMR: Colonic Diverticulosis  . Esophagogastroduodenoscopy (egd) with propofol N/A 04/10/2014    RMR: Mild erosive reflux esophagitis. Multiple antral polyps likely hyperplastic status post removal by hot snare cautery technique. Diffusely abnormal stomach status post gastric biopsy I suspect some of patients anemaia may be due to intermittent oozing from the stomach. It would be difficult  and a risky proposition to attempt complete removal of all of his gastric polyps.  . Polypectomy N/A 04/10/2014    Procedure: GASTRIC POLYPECTOMY;  Surgeon: Daneil Dolin, MD;  Location: [facility];  Service: Endoscopy;  Laterality: N/A;  . Esophageal biopsy N/A 04/10/2014    Procedure: GASTRIC BIOPSY;  Surgeon:  Daneil Dolin, MD;  Location: [facility];  Service: Endoscopy;  Laterality: N/A;  . Right heart catheterization N/A 02/06/2014    Procedure: RIGHT HEART CATH;  Surgeon: Larey Dresser, MD;  Location: [facility] CATH LAB;  Service: Cardiovascular;  Laterality: N/A;    Prior to Admission medications   Medication Sig Start Date End Date Taking? Authorizing Provider  acyclovir (ZOVIRAX) 800 MG tablet Take 800 mg by mouth 5 (five) times daily.  04/21/15  Yes Historical Provider, MD  aspirin 81 MG EC tablet Take 1 tablet (81 mg total) by mouth daily. 12/17/13  Yes Larey Dresser, MD  atorvastatin (LIPITOR) 80 MG tablet Take 80 mg by mouth daily.   Yes Historical Provider, MD  carvedilol (COREG) 25 MG tablet Take 12.5 mg by mouth 2 (two) times daily with a meal.    Yes Historical Provider, MD  Cholecalciferol (VITAMIN D3) 2000 UNITS TABS Take 2,000 mg by mouth daily.   Yes Historical Provider, MD  digoxin (LANOXIN) 0.125 MG tablet TAKE   ? BY MOUTH   DAILY Patient taking differently: TAKE ONE-HALF TABLET BY MOUTH ONCE DAILY 03/25/15  Yes Jolaine Artist, MD  ferrous sulfate 325 (65 FE) MG tablet Take 325 mg by mouth daily with breakfast.   Yes Historical Provider, MD  furosemide (LASIX) 40 MG tablet TAKE ONE TABLET   BY MOUTH   DAILY 03/25/15  Yes Larey Dresser, MD  hydrALAZINE (APRESOLINE) 25 MG tablet Take 3 tablets by mouth 3 (three) times daily. 03/10/14  Yes Historical Provider, MD  insulin lispro (HUMALOG) 100 UNIT/ML injection Inject 2-6 Units into the skin 3 (three) times daily with meals.   Yes Historical Provider, MD  isosorbide dinitrate (ISORDIL) 20 MG tablet Take 2 tablets by mouth 3 (three) times daily. 03/10/14  Yes Historical Provider, MD  losartan (COZAAR) 25 MG tablet TAKE ONE TABLET   BY MOUTH   DAILY 04/10/15  Yes Jolaine Artist, MD  pantoprazole (PROTONIX) 40 MG tablet TAKE 1 TABLET BY MOUTH ONCE A DAY. 01/06/15  Yes Carlis Stable, NP  traMADol (ULTRAM) 50 MG tablet Take 50 mg by mouth  every 6 (six) hours as needed.  08/05/14  Yes Historical Provider, MD    Social History:  reports that he quit smoking about 21 years ago. His smoking use included Cigarettes. He has never used smokeless tobacco. He reports that he drinks alcohol. He reports that he does not use illicit drugs.  Family History  Problem Relation Age of Onset  . Kidney disease Mother   . Kidney disease Sister   . Colon cancer Neg Hx     Filed Weights   04/28/15 1526  Weight: 58.06 kg (128 lb)    All the positives are listed in BOLD  Review of Systems:  HEENT: Headache, blurred vision, runny nose, sore throat Neck: Hypothyroidism, hyperthyroidism,,lymphadenopathy Chest : Shortness of breath, history of COPD, Asthma Heart : Chest pain, history of coronary arterey disease GI:  Nausea, vomiting, diarrhea, constipation, GERD GU: Dysuria, urgency, frequency of urination, hematuria Neuro: Stroke, seizures, syncope Psych: Depression, anxiety, hallucinations   Physical Exam: Blood pressure 160/94, pulse 76, temperature 97.6  F (36.4 C), temperature source Oral, resp. rate 19, height 5\' 2"  (1.575 m), weight 58.06 kg (128 lb), SpO2 100 %. Constitutional:   Patient is a well-developed and well-nourished male in no acute distress and cooperative with exam. Head: Normocephalic and atraumatic Mouth: Mucus membranes moist Eyes: PERRL, EOMI, conjunctivae normal Neck: Supple, No Thyromegaly Cardiovascular: RRR, S1 normal, S2 normal Pulmonary/Chest: CTAB, no wheezes, rales, or rhonchi Abdominal: Soft. Non-tender, non-distended, bowel sounds are normal, no masses, organomegaly, or guarding present.  Neurological: A&O x3, Strength is normal and symmetric bilaterally, cranial nerve II-XII are grossly intact, no focal motor deficit, sensory intact to light touch bilaterally.  Extremities : Multiple blisters noted in the right upper extremity, mild erythema, some bleeding areas  Labs on Admission:  Basic Metabolic  Panel:  Recent Labs Lab 04/28/15 1628  NA 130*  K 4.3  CL 98*  CO2 25  GLUCOSE 127*  BUN 54*  CREATININE 2.66*  CALCIUM 9.1   CBC:  Recent Labs Lab 04/28/15 1628  WBC 2.9*  NEUTROABS 1.4*  HGB 11.7*  HCT 35.4*  MCV 87.4  PLT 128*   Cardiac Enzymes:  Recent Labs Lab 04/28/15 1628 04/28/15 1857  TROPONINI 0.30* 0.26*    BNP (last 3 results)  Recent Labs  08/05/14 1500 04/28/15 1628  BNP 377.2* 196.0*    Radiological Exams on Admission: Ct Head Wo Contrast  04/28/2015  CLINICAL DATA:  Weakness for 1 week, recent diagnosis of shingles, nonfunctional pacemaker, altered mental status, type II diabetes mellitus, chronic systolic heart failure, essential benign hypertension, non ischemic cardiomyopathy, COPD, stroke, former smoker EXAM: CT HEAD WITHOUT CONTRAST TECHNIQUE: Contiguous axial images were obtained from the base of the skull through the vertex without intravenous contrast. COMPARISON:  None FINDINGS: Generalized atrophy. Normal ventricular morphology. No midline shift or mass effect. Small vessel chronic ischemic changes of deep cerebral white matter. Encephalomalacia at an old appearing RIGHT posterior temporoparietal infarct. Old LEFT thalamic lacunar, RIGHT pontine, RIGHT cerebellar, and question LEFT external capsule No intracranial hemorrhage, mass lesion, or acute infarction. Visualized paranasal sinuses and mastoid air cells clear. Bones unremarkable. Atherosclerotic calcifications at the carotid siphons. IMPRESSION: Atrophy with small vessel chronic ischemic changes of deep cerebral white matter. Multiple old infarcts as above. No definite acute intracranial abnormalities. Electronically Signed   By: Lavonia Dana M.D.   On: 04/28/2015 17:41   Dg Chest Port 1 View  04/28/2015  CLINICAL DATA:  Altered mental status EXAM: PORTABLE CHEST 1 VIEW COMPARISON:  December 31, 2014 FINDINGS: There is no edema or consolidation. Heart is upper normal in size with  pulmonary vascularity within normal limits. Pacemaker lead is attached to the right ventricle. No adenopathy. No appreciable bone lesions. IMPRESSION: No edema or consolidation. Electronically Signed   By: Lowella Grip III M.D.   On: 04/28/2015 16:59    EKG: Independently reviewed. Normal sinus rhythm, LVH   Assessment/Plan Active Problems:   Essential hypertension, benign   CAD (coronary artery disease)   ICD (implantable cardioverter-defibrillator) in place   Chest pain   Elevated troponin  Elevated troponin ? Cause We'll admit the patient for elevated troponin, currently patient has no chest pain. Cardiology Dr. Domingo Cocking at [facility] was consulted by the ED physician. No aggressive management required at this time. Patient to be kept at [facility] hospital for serial troponin, check echo in a.m. Cardiology consultation in a.m. Continue aspirin, Coreg, isosorbide.  ? Pacemaker malfunction Patient has ICD in place, pacemaker component nonworking as it  was turned way down. Defibrillator interrogated in the ED which seemed to be functioning fine. Cardiology to follow in a.m.  Herpes zoster Patient currently on acyclovir, will continue with acyclovir.  Diabetes mellitus We'll start with sliding scale insulin with NovoLog  Acute on chronic kidney disease Patient has worsening of BUN and creatinine, will hold Lasix We'll start gentle IV hydration, follow BMP in a.m.  Hypertension Continue Coreg, hydralazine, Cozaar  DVT prophylaxis Lovenox  Code status: Full code  Family discussion: No family at bedside.   Time Spent on Admission: 60 minutes  [facility] Pager: [phone] 04/28/2015, 9:07 PM  If 7PM-7AM, please contact night-coverage  [email or web address]  Password [email or web address]

## 2015-04-28 NOTE — Telephone Encounter (Signed)
rx sent

## 2015-04-28 NOTE — ED Notes (Signed)
Pt brought in by family member. Family reports that pacemaker attempted to be interrogated at [facility] and information was not transmitting. Family states pt also has been weak x 1 week. Pt also diagnosed with shingles last Tuesday and is currently taking medication for it prescribed by MD Luan Pulling. 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

## 2015-04-28 NOTE — ED Notes (Addendum)
Caregiver Jeronimo Greaves [facility] [phone]

## 2015-04-28 NOTE — ED Provider Notes (Addendum)
CSN: [id number]     Arrival date & time 04/28/15  1521 History   First MD Initiated Contact with Patient 04/28/15 1619     Chief Complaint  Patient presents with  . Pacemaker Problem     (Consider location/radiation/quality/duration/timing/severity/associated sxs/prior Treatment) The history is provided by the patient.   64 year old male from adult care center brought in for concerns for pacemaker not functioning properly. They tried to interrogated today and couldn't get it to connect properly patient noted by family to be fatigued for the last week. Patient is currently being treated for shingles affecting his right arm with medication prescribed by Dr. Luan Pulling. Patient has a past history of congestive heart failure diabetes nonischemic cardiomyopathy chronic kidney disease COPD and past history of strokes.  Past Medical History  Diagnosis Date  . Chronic systolic heart failure (HCC)     NYHA Class III  . Type 2 diabetes mellitus ([place])   . Nonischemic cardiomyopathy (HCC)     EF 15-20%  . Essential hypertension, benign   . CKD (chronic kidney disease) stage 3, GFR 30-59 ml/min   . Anemia   . Hyperlipidemia   . History of stroke   . Bell's palsy   . Cardiomyopathy ([place])   . Aortic insufficiency   . Alcoholism in remission ([place])   . Noncompliance with medication regimen   . NSVT (nonsustained ventricular tachycardia) ([place])   . Cardiac defibrillator in situ   . COPD (chronic obstructive pulmonary disease) ([place])   . CVA (cerebral infarction) 01/2013  . Productive cough 08/2013    With brown sputum   . SOB (shortness of breath) on exertion 08/2013  . Numbness of right jaw     Had a stoke in 01/2013. Numbness is occasional, especially when trying to chew.  . At moderate risk for fall   . AICD (automatic cardioverter/defibrillator) present   . Stroke [facility])     weakness of right side from CVA  . Arthritis    Past Surgical History  Procedure Laterality Date  . Cardiac  defibrillator placement  11/12/12    [employer] Inogen MINI ICD implanted in [place] at [facility]  . Cardiac surgery      bioprostetic AVR per Dr Nelly Laurence note  . Colonoscopy with propofol N/A 04/10/2014    RMR: Colonic Diverticulosis  . Esophagogastroduodenoscopy (egd) with propofol N/A 04/10/2014    RMR: Mild erosive reflux esophagitis. Multiple antral polyps likely hyperplastic status post removal by hot snare cautery technique. Diffusely abnormal stomach status post gastric biopsy I suspect some of patients anemaia may be due to intermittent oozing from the stomach. It would be difficult and a risky proposition to attempt complete removal of all of his gastric polyps.  . Polypectomy N/A 04/10/2014    Procedure: GASTRIC POLYPECTOMY;  Surgeon: Daneil Dolin, MD;  Location: [facility];  Service: Endoscopy;  Laterality: N/A;  . Esophageal biopsy N/A 04/10/2014    Procedure: GASTRIC BIOPSY;  Surgeon: Daneil Dolin, MD;  Location: [facility];  Service: Endoscopy;  Laterality: N/A;  . Right heart catheterization N/A 02/06/2014    Procedure: RIGHT HEART CATH;  Surgeon: Larey Dresser, MD;  Location: [facility] CATH LAB;  Service: Cardiovascular;  Laterality: N/A;   Family History  Problem Relation Age of Onset  . Kidney disease Mother   . Kidney disease Sister   . Colon cancer Neg Hx    Social History  Substance Use Topics  . Smoking status: Former Smoker    Types:  Cigarettes    Quit date: 11/05/1993  . Smokeless tobacco: Never Used  . Alcohol Use: Yes     Comment: former heavy ETOH, none recently    Review of Systems  Constitutional: Positive for fatigue. Negative for fever and diaphoresis.  HENT: Negative for congestion.   Eyes: Negative for visual disturbance.  Respiratory: Negative for shortness of breath.   Cardiovascular: Negative for chest pain.  Gastrointestinal: Negative for nausea, vomiting and abdominal pain.  Genitourinary: Negative for dysuria.  Musculoskeletal:  Positive for back pain.  Skin: Positive for rash.  Neurological: Positive for weakness.  Hematological: Does not bruise/bleed easily.  Psychiatric/Behavioral: Negative for confusion.      Allergies  Review of patient's allergies indicates no known allergies.  Home Medications   Prior to Admission medications   Medication Sig Start Date End Date Taking? Authorizing Provider  acyclovir (ZOVIRAX) 800 MG tablet  04/21/15   Historical Provider, MD  aspirin 81 MG EC tablet Take 1 tablet (81 mg total) by mouth daily. 12/17/13   Larey Dresser, MD  atorvastatin (LIPITOR) 80 MG tablet Take 80 mg by mouth daily.    Historical Provider, MD  BD INSULIN SYRINGE ULTRAFINE 31G X 5/16" 0.3 ML MISC  09/22/14   Historical Provider, MD  carvedilol (COREG) 25 MG tablet Take 12.5 mg by mouth 2 (two) times daily with a meal.     Historical Provider, MD  Cholecalciferol (VITAMIN D3) 2000 UNITS TABS Take 2,000 mg by mouth daily.    Historical Provider, MD  digoxin (LANOXIN) 0.125 MG tablet TAKE   ? BY MOUTH   DAILY 03/25/15   Jolaine Artist, MD  ferrous sulfate 325 (65 FE) MG tablet Take 325 mg by mouth daily with breakfast.    Historical Provider, MD  furosemide (LASIX) 40 MG tablet TAKE ONE TABLET   BY MOUTH   DAILY 03/25/15   Larey Dresser, MD  hydrALAZINE (APRESOLINE) 25 MG tablet Take 3 tablets by mouth 3 (three) times daily. 03/10/14   Historical Provider, MD  insulin lispro (HUMALOG) 100 UNIT/ML injection Inject 2-6 Units into the skin 3 (three) times daily with meals.    Historical Provider, MD  isosorbide dinitrate (ISORDIL) 20 MG tablet Take 2 tablets by mouth 3 (three) times daily. 03/10/14   Historical Provider, MD  losartan (COZAAR) 25 MG tablet TAKE ONE TABLET   BY MOUTH   DAILY 04/10/15   Jolaine Artist, MD  mupirocin ointment (BACTROBAN) 2 %  04/27/15   Historical Provider, MD  NOVOLOG 100 UNIT/ML injection  09/23/14   Historical Provider, MD  pantoprazole (PROTONIX) 40 MG tablet TAKE  1 TABLET BY MOUTH ONCE A DAY. 01/06/15   Carlis Stable, NP  REA LO 40 40 % CREA  07/04/14   Historical Provider, MD  traMADol (ULTRAM) 50 MG tablet 50 mg every 6 (six) hours as needed.  08/05/14   Historical Provider, MD   BP 164/89 mmHg  Pulse 72  Temp(Src) 97.6 F (36.4 C) (Oral)  Resp 20  Ht 5\' 2"  (1.575 m)  Wt 58.06 kg  BMI 23.41 kg/m2  SpO2 100% Physical Exam  Constitutional: He is oriented to person, place, and time. He appears well-developed and well-nourished. No distress.  HENT:  Head: Normocephalic and atraumatic.  Mucous membranes slightly dry.  Eyes: EOM are normal. Pupils are equal, round, and reactive to light.  Neck: Normal range of motion. Neck supple.  Cardiovascular: Normal rate, regular rhythm and normal heart sounds.  No murmur heard. Pulmonary/Chest: Effort normal and breath sounds normal. No respiratory distress.  Abdominal: Soft. Bowel sounds are normal. There is no tenderness.  Musculoskeletal: Normal range of motion.  Neurological: He is alert and oriented to person, place, and time. No cranial nerve deficit. He exhibits normal muscle tone. Coordination normal.  Skin: Skin is warm. Rash noted. There is erythema.  Rash with blistering consistent with zoster to right arm. In right back.  Nursing note and vitals reviewed.   ED Course  Procedures (including critical care time) Labs Review Labs Reviewed  BASIC METABOLIC PANEL - Abnormal; Notable for the following:    Sodium 130 (*)    Chloride 98 (*)    Glucose, Bld 127 (*)    BUN 54 (*)    Creatinine, Ser 2.66 (*)    GFR calc non Af Amer 24 (*)    GFR calc Af Amer 28 (*)    All other components within normal limits  CBC WITH DIFFERENTIAL/PLATELET - Abnormal; Notable for the following:    WBC 2.9 (*)    RBC 4.05 (*)    Hemoglobin 11.7 (*)    HCT 35.4 (*)    Platelets 128 (*)    Neutro Abs 1.4 (*)    All other components within normal limits  URINALYSIS, ROUTINE W REFLEX MICROSCOPIC (NOT AT [facility]) -  Abnormal; Notable for the following:    Protein, ur 100 (*)    All other components within normal limits  BRAIN NATRIURETIC PEPTIDE - Abnormal; Notable for the following:    B Natriuretic Peptide 196.0 (*)    All other components within normal limits  TROPONIN I - Abnormal; Notable for the following:    Troponin I 0.30 (*)    All other components within normal limits  URINE MICROSCOPIC-ADD ON - Abnormal; Notable for the following:    Squamous Epithelial / LPF 0-5 (*)    Bacteria, UA RARE (*)    Casts HYALINE CASTS (*)    All other components within normal limits  CBC WITH DIFFERENTIAL/PLATELET  TROPONIN I    Imaging Review Ct Head Wo Contrast  04/28/2015  CLINICAL DATA:  Weakness for 1 week, recent diagnosis of shingles, nonfunctional pacemaker, altered mental status, type II diabetes mellitus, chronic systolic heart failure, essential benign hypertension, non ischemic cardiomyopathy, COPD, stroke, former smoker EXAM: CT HEAD WITHOUT CONTRAST TECHNIQUE: Contiguous axial images were obtained from the base of the skull through the vertex without intravenous contrast. COMPARISON:  None FINDINGS: Generalized atrophy. Normal ventricular morphology. No midline shift or mass effect. Small vessel chronic ischemic changes of deep cerebral white matter. Encephalomalacia at an old appearing RIGHT posterior temporoparietal infarct. Old LEFT thalamic lacunar, RIGHT pontine, RIGHT cerebellar, and question LEFT external capsule No intracranial hemorrhage, mass lesion, or acute infarction. Visualized paranasal sinuses and mastoid air cells clear. Bones unremarkable. Atherosclerotic calcifications at the carotid siphons. IMPRESSION: Atrophy with small vessel chronic ischemic changes of deep cerebral white matter. Multiple old infarcts as above. No definite acute intracranial abnormalities. Electronically Signed   By: Lavonia Dana M.D.   On: 04/28/2015 17:41   Dg Chest Port 1 View  04/28/2015  CLINICAL DATA:   Altered mental status EXAM: PORTABLE CHEST 1 VIEW COMPARISON:  December 31, 2014 FINDINGS: There is no edema or consolidation. Heart is upper normal in size with pulmonary vascularity within normal limits. Pacemaker lead is attached to the right ventricle. No adenopathy. No appreciable bone lesions. IMPRESSION: No edema or consolidation. Electronically Signed  By: Lowella Grip III M.D.   On: 04/28/2015 16:59   I have personally reviewed and evaluated these images and lab results as part of my medical decision-making.   EKG Interpretation   Date/Time:  Tuesday April 28 2015 16:25:35 EST Ventricular Rate:  70 PR Interval:  193 QRS Duration: 111 QT Interval:  387 QTC Calculation: 418 R Axis:   -34 Text Interpretation:  Sinus rhythm LVH with secondary repolarization  abnormality Baseline wander in lead(s) V3 Left axis deviation No  significant change since last tracing Confirmed by Patrick Sohm  MD, Dae Antonucci  [phone]) on 04/28/2015 4:29:39 PM      EKG with some subtle changes compared to September in the anterior leads.   MDM   Final diagnoses:  Dehydration  Other fatigue   Patient brought in from the adult care center. They were trying to interrogate his pacemaker and were unable to get to work properly. Family states patient has-been weak and fatigued for about a week. Patient recently diagnosed with shingles which is affecting his right arm. He is on medication for that prescribed by Dr. Luan Pulling.  Patient had a defibrillator placed in 2014 and does have a pacemaker component however the pacemaker is turned way down so patient actively never uses it. We had the defibrillator interrogated and everything seems to be functioning fine. They did nose notice some variation is respiratory patterns and thought maybe that it would be consistent with some congestive heart failure. However patient's BNP is actually lower than usual chest x-ray shows no edema pneumonia or pneumothorax. Patient  denied any chest pain however his initial troponin was significantly elevated EKG maybe had some subtle changes in the anterior leads. Repeat troponin showed a decreased slight decrease in the troponin so is not climbing. Possible patient may have had a silent MI or missed MI.  Patient's labs are consistent with a jump in his BUN and creatinine probably due to some dehydration. Patient is on Lasix to help control his congestive heart failure. May have been a little dried out.  We'll discuss with hospitalist regarding admission for the dehydration and the elevated troponins.    Fredia Sorrow, MD 04/28/15 1949   Discussed with cardiology Dr. Domingo Cocking at [facility]. Feels that patient can stay here and have the serial troponin checks echocardiogram by cardiology tomorrow. Does not feel he is a candidate for cath at this point in time and patient is stable enough to stay here. Will rediscuss with the hospitalist.  Fredia Sorrow, MD 04/28/15 2017

## 2015-04-28 NOTE — Telephone Encounter (Signed)
°  1. Has your device fired? unknown  2. Is you device beeping? NO  3. Are you experiencing draining or swelling at device site? It looks different than it normally look its poked out a quarter of an inch  4. Are you calling to see if we received your device transmission? NO  5. Have you passed out? NO, the staff is unable to track his device  Pt has been diagnosed with Melanoma last month and he seen his kidney Dr. Fuller Canada NP 2 week ago bp was low  Isosorbide 20mg  2 and 8   Pt has shingles and it has spread down his right arm and his left arm is not moving too good and he is weak and falling he has defecated himself today not able to make it to the br  Pt has no energy

## 2015-04-28 NOTE — Telephone Encounter (Addendum)
Spoke to Jeronimo Greaves [facility]) regarding patient's recent sx's and his device. EC states that the patient has been "disoriented" since yesterday, refuses to use his right arm (possibly due to shingles), has been incontinent of his bowel, and has been fatigued. EC was concerned bc he tried to send a remote transmission, but was unable to send. EC also stated that the device was "poked out" about 1/16 of an inch, but the patient's skin was still intact. I advised EC to send pt to the hospital for his recent sx's and his device could be checked there. EC voiced understanding and is agreeable with plan.

## 2015-04-29 ENCOUNTER — Observation Stay (HOSPITAL_COMMUNITY): Payer: Medicare HMO

## 2015-04-29 DIAGNOSIS — Z95810 Presence of automatic (implantable) cardiac defibrillator: Secondary | ICD-10-CM | POA: Diagnosis not present

## 2015-04-29 DIAGNOSIS — I429 Cardiomyopathy, unspecified: Secondary | ICD-10-CM | POA: Diagnosis not present

## 2015-04-29 DIAGNOSIS — R7989 Other specified abnormal findings of blood chemistry: Secondary | ICD-10-CM | POA: Diagnosis not present

## 2015-04-29 LAB — COMPREHENSIVE METABOLIC PANEL
ALT: 34 U/L (ref 17–63)
ANION GAP: 5 (ref 5–15)
AST: 51 U/L — ABNORMAL HIGH (ref 15–41)
Albumin: 2.7 g/dL — ABNORMAL LOW (ref 3.5–5.0)
Alkaline Phosphatase: 85 U/L (ref 38–126)
BUN: 44 mg/dL — ABNORMAL HIGH (ref 6–20)
CHLORIDE: 103 mmol/L (ref 101–111)
CO2: 23 mmol/L (ref 22–32)
CREATININE: 1.84 mg/dL — AB (ref 0.61–1.24)
Calcium: 8.5 mg/dL — ABNORMAL LOW (ref 8.9–10.3)
GFR calc non Af Amer: 37 mL/min — ABNORMAL LOW (ref >60)
GFR, EST AFRICAN AMERICAN: 43 mL/min — AB (ref >60)
Glucose, Bld: 88 mg/dL (ref 65–99)
Potassium: 3.8 mmol/L (ref 3.5–5.1)
SODIUM: 131 mmol/L — AB (ref 135–145)
Total Bilirubin: 0.5 mg/dL (ref 0.3–1.2)
Total Protein: 7.4 g/dL (ref 6.5–8.1)

## 2015-04-29 LAB — CBC
HCT: 35.5 % — ABNORMAL LOW (ref 39.0–52.0)
Hemoglobin: 11.7 g/dL — ABNORMAL LOW (ref 13.0–17.0)
MCH: 28.5 pg (ref 26.0–34.0)
MCHC: 33 g/dL (ref 30.0–36.0)
MCV: 86.4 fL (ref 78.0–100.0)
PLATELETS: 122 10*3/uL — AB (ref 150–400)
RBC: 4.11 MIL/uL — AB (ref 4.22–5.81)
RDW: 12.6 % (ref 11.5–15.5)
WBC: 3.2 10*3/uL — ABNORMAL LOW (ref 4.0–10.5)

## 2015-04-29 LAB — TROPONIN I
TROPONIN I: 0.31 ng/mL — AB (ref <0.031)
Troponin I: 0.28 ng/mL — ABNORMAL HIGH (ref <0.031)

## 2015-04-29 LAB — GLUCOSE, CAPILLARY
GLUCOSE-CAPILLARY: 83 mg/dL (ref 65–99)
GLUCOSE-CAPILLARY: 95 mg/dL (ref 65–99)
Glucose-Capillary: 73 mg/dL (ref 65–99)

## 2015-04-29 NOTE — Consult Note (Signed)
Reason for Consult: Pacemaker transmission problems/weakness, positive troponins. Referring Physician: Dr. Luan Pulling Cardiologist: Dr. Harl Bowie EPS: Dr. Lovena Le CHF clinic:Dr. Haroldine Laws   Keith Young is an 64 y.o. male with history of nonischemic cardiomyopathy and aortic valve replacement who presents for trouble with his routine pacemaker transmission and reported mild weakness.  Patient is followed by Dr. Harl Bowie.  He recently moved to [place] from [place] to live in an adult care home run by his sister and brother-in-law.  Most recent echo in 5/16 showed EF 35-40% (improved from 15-20%) with minimal AS.  He had left heart cath in [place] in 2014 with no significant CAD. Cause of cardiomyopathy thought to be h/o ETOH abuse + HTN.  He has a [place].  He also has a bioprosthetic aortic valve (placed in [place], not sure when).  He had a CPX done in 8/15 that showed severe functional limitation, but RHC in 9/15 showed normal filling pressures and preserved cardiac output. He saw Dr. Lovena Le 12/2014 and ICD functioning normally. She also has chronic renal insufficiency and multiple myeloma.  He was recently diagnosed with shingles and was placed on medication. He has been disoriented and incontinent of bowel as well as fatigue. He tells me he just wanted to get checked out because his pacemaker transmission didn't work. Denies chest pain, palpitations, dyspnea, dyspnea on exertion, dizziness or presyncope. Troponins positive but flat at 0.26, 0.29, 0.30, 0.31.   Past Medical History  Diagnosis Date  . Chronic systolic heart failure (HCC)     NYHA Class III  . Type 2 diabetes mellitus ([place])   . Nonischemic cardiomyopathy (HCC)     EF 15-20%  . Essential hypertension, benign   . CKD (chronic kidney disease) stage 3, GFR 30-59 ml/min   . Anemia   . Hyperlipidemia   . History of stroke   . Bell's palsy   . Cardiomyopathy ([place])   . Aortic insufficiency   . Alcoholism  in remission ([place])   . Noncompliance with medication regimen   . NSVT (nonsustained ventricular tachycardia) ([place])   . Cardiac defibrillator in situ   . COPD (chronic obstructive pulmonary disease) ([place])   . CVA (cerebral infarction) 01/2013  . Productive cough 08/2013    With brown sputum   . SOB (shortness of breath) on exertion 08/2013  . Numbness of right jaw     Had a stoke in 01/2013. Numbness is occasional, especially when trying to chew.  . At moderate risk for fall   . AICD (automatic cardioverter/defibrillator) present   . Stroke [facility])     weakness of right side from CVA  . Arthritis     Past Surgical History  Procedure Laterality Date  . Cardiac defibrillator placement  11/12/12    [employer] Inogen MINI ICD implanted in [place] at [facility]  . Cardiac surgery      bioprostetic AVR per Dr Nelly Laurence note  . Colonoscopy with propofol N/A 04/10/2014    RMR: Colonic Diverticulosis  . Esophagogastroduodenoscopy (egd) with propofol N/A 04/10/2014    RMR: Mild erosive reflux esophagitis. Multiple antral polyps likely hyperplastic status post removal by hot snare cautery technique. Diffusely abnormal stomach status post gastric biopsy I suspect some of patients anemaia may be due to intermittent oozing from the stomach. It would be difficult and a risky proposition to attempt complete removal of all of his gastric polyps.  . Polypectomy N/A 04/10/2014    Procedure: GASTRIC POLYPECTOMY;  Surgeon: Herbie Baltimore  Hilton Cork, MD;  Location: [facility];  Service: Endoscopy;  Laterality: N/A;  . Esophageal biopsy N/A 04/10/2014    Procedure: GASTRIC BIOPSY;  Surgeon: Daneil Dolin, MD;  Location: [facility];  Service: Endoscopy;  Laterality: N/A;  . Right heart catheterization N/A 02/06/2014    Procedure: RIGHT HEART CATH;  Surgeon: Larey Dresser, MD;  Location: [facility] CATH LAB;  Service: Cardiovascular;  Laterality: N/A;    Family History  Problem Relation Age of Onset  . Kidney disease  Mother   . Kidney disease Sister   . Colon cancer Neg Hx     Social History:  reports that he quit smoking about 21 years ago. His smoking use included Cigarettes. He has never used smokeless tobacco. He reports that he drinks alcohol. He reports that he does not use illicit drugs.  Allergies: No Known Allergies  Medications:  Scheduled Meds: . acyclovir  800 mg Oral 5 X Daily  . aspirin  81 mg Oral Daily  . atorvastatin  80 mg Oral Daily  . carvedilol  12.5 mg Oral BID WC  . digoxin  62.5 mcg Oral Daily  . enoxaparin (LOVENOX) injection  40 mg Subcutaneous Q24H  . ferrous sulfate  325 mg Oral Q breakfast  . hydrALAZINE  75 mg Oral TID  . insulin aspart  0-9 Units Subcutaneous TID WC  . isosorbide dinitrate  40 mg Oral TID  . losartan  25 mg Oral Daily  . pantoprazole  40 mg Oral Daily   Continuous Infusions: . sodium chloride 75 mL/hr at 04/29/15 0110   PRN Meds:.acetaminophen **OR** acetaminophen, ondansetron **OR** ondansetron (ZOFRAN) IV   Results for orders placed or performed during the hospital encounter of 04/28/15 (from the past 48 hour(s))  Basic metabolic panel     Status: Abnormal   Collection Time: 04/28/15  4:28 PM  Result Value Ref Range   Sodium 130 (L) 135 - 145 mmol/L   Potassium 4.3 3.5 - 5.1 mmol/L   Chloride 98 (L) 101 - 111 mmol/L   CO2 25 22 - 32 mmol/L   Glucose, Bld 127 (H) 65 - 99 mg/dL   BUN 54 (H) 6 - 20 mg/dL   Creatinine, Ser 2.66 (H) 0.61 - 1.24 mg/dL   Calcium 9.1 8.9 - 10.3 mg/dL   GFR calc non Af Amer 24 (L) >60 mL/min   GFR calc Af Amer 28 (L) >60 mL/min    Comment: (NOTE) The eGFR has been calculated using the CKD EPI equation. This calculation has not been validated in all clinical situations. eGFR's persistently <60 mL/min signify possible Chronic Kidney Disease.    Anion gap 7 5 - 15  CBC with Differential/Platelet     Status: Abnormal   Collection Time: 04/28/15  4:28 PM  Result Value Ref Range   WBC 2.9 (L) 4.0 - 10.5 K/uL    RBC 4.05 (L) 4.22 - 5.81 MIL/uL   Hemoglobin 11.7 (L) 13.0 - 17.0 g/dL   HCT 35.4 (L) 39.0 - 52.0 %   MCV 87.4 78.0 - 100.0 fL   MCH 28.9 26.0 - 34.0 pg   MCHC 33.1 30.0 - 36.0 g/dL   RDW 12.7 11.5 - 15.5 %   Platelets 128 (L) 150 - 400 K/uL   Neutrophils Relative % 48 %   Neutro Abs 1.4 (L) 1.7 - 7.7 K/uL   Lymphocytes Relative 38 %   Lymphs Abs 1.1 0.7 - 4.0 K/uL   Monocytes Relative 12 %  Monocytes Absolute 0.4 0.1 - 1.0 K/uL   Eosinophils Relative 1 %   Eosinophils Absolute 0.0 0.0 - 0.7 K/uL   Basophils Relative 1 %   Basophils Absolute 0.0 0.0 - 0.1 K/uL  Brain natriuretic peptide     Status: Abnormal   Collection Time: 04/28/15  4:28 PM  Result Value Ref Range   B Natriuretic Peptide 196.0 (H) 0.0 - 100.0 pg/mL  Troponin I     Status: Abnormal   Collection Time: 04/28/15  4:28 PM  Result Value Ref Range   Troponin I 0.30 (H) <0.031 ng/mL    Comment:        PERSISTENTLY INCREASED TROPONIN VALUES IN THE RANGE OF 0.04-0.49 ng/mL CAN BE SEEN IN:       -UNSTABLE ANGINA       -CONGESTIVE HEART FAILURE       -MYOCARDITIS       -CHEST TRAUMA       -ARRYHTHMIAS       -LATE PRESENTING MYOCARDIAL INFARCTION       -COPD   CLINICAL FOLLOW-UP RECOMMENDED.   Urinalysis, Routine w reflex microscopic (not at [facility])     Status: Abnormal   Collection Time: 04/28/15  5:40 PM  Result Value Ref Range   Color, Urine YELLOW YELLOW   APPearance CLEAR CLEAR   Specific Gravity, Urine 1.020 1.005 - 1.030   pH 5.5 5.0 - 8.0   Glucose, UA NEGATIVE NEGATIVE mg/dL   Hgb urine dipstick NEGATIVE NEGATIVE   Bilirubin Urine NEGATIVE NEGATIVE   Ketones, ur NEGATIVE NEGATIVE mg/dL   Protein, ur 100 (A) NEGATIVE mg/dL   Nitrite NEGATIVE NEGATIVE   Leukocytes, UA NEGATIVE NEGATIVE  Urine microscopic-add on     Status: Abnormal   Collection Time: 04/28/15  5:40 PM  Result Value Ref Range   Squamous Epithelial / LPF 0-5 (A) NONE SEEN   WBC, UA 0-5 0 - 5 WBC/hpf   RBC / HPF NONE SEEN 0 - 5  RBC/hpf   Bacteria, UA RARE (A) NONE SEEN   Casts HYALINE CASTS (A) NEGATIVE  Troponin I     Status: Abnormal   Collection Time: 04/28/15  6:57 PM  Result Value Ref Range   Troponin I 0.26 (H) <0.031 ng/mL    Comment:        PERSISTENTLY INCREASED TROPONIN VALUES IN THE RANGE OF 0.04-0.49 ng/mL CAN BE SEEN IN:       -UNSTABLE ANGINA       -CONGESTIVE HEART FAILURE       -MYOCARDITIS       -CHEST TRAUMA       -ARRYHTHMIAS       -LATE PRESENTING MYOCARDIAL INFARCTION       -COPD   CLINICAL FOLLOW-UP RECOMMENDED.   CBG monitoring, ED     Status: Abnormal   Collection Time: 04/28/15  9:12 PM  Result Value Ref Range   Glucose-Capillary 55 (L) 65 - 99 mg/dL  Troponin I     Status: Abnormal   Collection Time: 04/28/15  9:49 PM  Result Value Ref Range   Troponin I 0.29 (H) <0.031 ng/mL    Comment:        PERSISTENTLY INCREASED TROPONIN VALUES IN THE RANGE OF 0.04-0.49 ng/mL CAN BE SEEN IN:       -UNSTABLE ANGINA       -CONGESTIVE HEART FAILURE       -MYOCARDITIS       -CHEST TRAUMA       -  ARRYHTHMIAS       -LATE PRESENTING MYOCARDIAL INFARCTION       -COPD   CLINICAL FOLLOW-UP RECOMMENDED.   CBG monitoring, ED     Status: Abnormal   Collection Time: 04/28/15 10:10 PM  Result Value Ref Range   Glucose-Capillary 117 (H) 65 - 99 mg/dL  Glucose, capillary     Status: None   Collection Time: 04/29/15 12:32 AM  Result Value Ref Range   Glucose-Capillary 83 65 - 99 mg/dL  CBC     Status: Abnormal   Collection Time: 04/29/15  4:01 AM  Result Value Ref Range   WBC 3.2 (L) 4.0 - 10.5 K/uL   RBC 4.11 (L) 4.22 - 5.81 MIL/uL   Hemoglobin 11.7 (L) 13.0 - 17.0 g/dL   HCT 35.5 (L) 39.0 - 52.0 %   MCV 86.4 78.0 - 100.0 fL   MCH 28.5 26.0 - 34.0 pg   MCHC 33.0 30.0 - 36.0 g/dL   RDW 12.6 11.5 - 15.5 %   Platelets 122 (L) 150 - 400 K/uL  Comprehensive metabolic panel     Status: Abnormal   Collection Time: 04/29/15  4:01 AM  Result Value Ref Range   Sodium 131 (L) 135 - 145  mmol/L   Potassium 3.8 3.5 - 5.1 mmol/L   Chloride 103 101 - 111 mmol/L   CO2 23 22 - 32 mmol/L   Glucose, Bld 88 65 - 99 mg/dL   BUN 44 (H) 6 - 20 mg/dL   Creatinine, Ser 1.84 (H) 0.61 - 1.24 mg/dL   Calcium 8.5 (L) 8.9 - 10.3 mg/dL   Total Protein 7.4 6.5 - 8.1 g/dL   Albumin 2.7 (L) 3.5 - 5.0 g/dL   AST 51 (H) 15 - 41 U/L   ALT 34 17 - 63 U/L   Alkaline Phosphatase 85 38 - 126 U/L   Total Bilirubin 0.5 0.3 - 1.2 mg/dL   GFR calc non Af Amer 37 (L) >60 mL/min   GFR calc Af Amer 43 (L) >60 mL/min    Comment: (NOTE) The eGFR has been calculated using the CKD EPI equation. This calculation has not been validated in all clinical situations. eGFR's persistently <60 mL/min signify possible Chronic Kidney Disease.    Anion gap 5 5 - 15  Troponin I     Status: Abnormal   Collection Time: 04/29/15  4:01 AM  Result Value Ref Range   Troponin I 0.31 (H) <0.031 ng/mL    Comment:        PERSISTENTLY INCREASED TROPONIN VALUES IN THE RANGE OF 0.04-0.49 ng/mL CAN BE SEEN IN:       -UNSTABLE ANGINA       -CONGESTIVE HEART FAILURE       -MYOCARDITIS       -CHEST TRAUMA       -ARRYHTHMIAS       -LATE PRESENTING MYOCARDIAL INFARCTION       -COPD   CLINICAL FOLLOW-UP RECOMMENDED.   Glucose, capillary     Status: None   Collection Time: 04/29/15  7:50 AM  Result Value Ref Range   Glucose-Capillary 73 65 - 99 mg/dL   Comment 1 Notify RN     Ct Head Wo Contrast  04/28/2015  CLINICAL DATA:  Weakness for 1 week, recent diagnosis of shingles, nonfunctional pacemaker, altered mental status, type II diabetes mellitus, chronic systolic heart failure, essential benign hypertension, non ischemic cardiomyopathy, COPD, stroke, former smoker EXAM: CT HEAD WITHOUT CONTRAST TECHNIQUE: Contiguous axial images  were obtained from the base of the skull through the vertex without intravenous contrast. COMPARISON:  None FINDINGS: Generalized atrophy. Normal ventricular morphology. No midline shift or mass  effect. Small vessel chronic ischemic changes of deep cerebral white matter. Encephalomalacia at an old appearing RIGHT posterior temporoparietal infarct. Old LEFT thalamic lacunar, RIGHT pontine, RIGHT cerebellar, and question LEFT external capsule No intracranial hemorrhage, mass lesion, or acute infarction. Visualized paranasal sinuses and mastoid air cells clear. Bones unremarkable. Atherosclerotic calcifications at the carotid siphons. IMPRESSION: Atrophy with small vessel chronic ischemic changes of deep cerebral white matter. Multiple old infarcts as above. No definite acute intracranial abnormalities. Electronically Signed   By: Lavonia Dana M.D.   On: 04/28/2015 17:41   Dg Chest Port 1 View  04/28/2015  CLINICAL DATA:  Altered mental status EXAM: PORTABLE CHEST 1 VIEW COMPARISON:  December 31, 2014 FINDINGS: There is no edema or consolidation. Heart is upper normal in size with pulmonary vascularity within normal limits. Pacemaker lead is attached to the right ventricle. No adenopathy. No appreciable bone lesions. IMPRESSION: No edema or consolidation. Electronically Signed   By: Lowella Grip III M.D.   On: 04/28/2015 16:59    ROS  See HPI Eyes: Negative Ears:Negative for hearing loss, tinnitus Cardiovascular: Negative for chest pain, palpitations,irregular heartbeat, dyspnea, dyspnea on exertion, near-syncope, orthopnea, paroxysmal nocturnal dyspnea and syncope,edema, claudication, cyanosis,.  Respiratory:   Negative for cough, hemoptysis, shortness of breath, sleep disturbances due to breathing, sputum production and wheezing.   Endocrine:positive for cold intolerance intolerance.  Hematologic/Lymphatic: Negative for adenopathy and bleeding problem. Does not bruise/bleed easily.  Musculoskeletal: Negative.   Gastrointestinal: incontinent bowel.Negative for nausea, vomiting, reflux, abdominal pain, diarrhea, constipation.   Genitourinary: Negative for bladder incontinence, dysuria, flank  pain, frequency, hematuria, hesitancy, nocturia and urgency.  Neurological: Negative.  Allergic/Immunologic: Negative for environmental allergies.  Blood pressure 120/74, pulse 83, temperature 98.3 F (36.8 C), temperature source Oral, resp. rate 22, height '5\' 2"'  (1.575 m), weight 126 lb 1.6 oz (57.199 kg), SpO2 100 %. Physical Exam  PHYSICAL EXAM: Chronically ill-appearing, in no acute distress. Neck: carotid bruits.No JVD, HJR, or thyroid enlargement Lungs: No tachypnea, clear without wheezing, rales, or rhonchi Cardiovascular: RRR, heart sounds normal, no murmurs, gallops. Abdomen: BS normal. Soft without organomegaly, masses, lesions or tenderness. Extremities: Herpes zoster right arm, otherwise lower extremities without cyanosis, clubbing or edema. Good distal pulses bilateral SKin: Warm and dry Musculoskeletal: No deformities Neuro: no focal signs  Echocardiogram 10/06/2014: Study Conclusions  - Left ventricle: The cavity size was moderately dilated. There was   mild concentric hypertrophy. Systolic function was moderately   reduced. The estimated ejection fraction was in the range of 35%   to 40%. Diffuse hypokinesis. Doppler parameters are consistent   with abnormal left ventricular relaxation (grade 1 diastolic   dysfunction). - Aortic valve: Appears to have tissue bioprosthesis with no   signficant AR. Leaflet motion not well seen but systolic   gradients not excessive. Valve area (VTI): 1.45 cm^2. Valve area   (Vmax): 1.38 cm^2. Valve area (Vmean): 1.33 cm^2. - Left atrium: The atrium was mildly dilated. - Atrial septum: No defect or patent foramen ovale was identified. - Impressions: Speckle type pattern in myocardium and markedly   abnormal basal/mid ventricular longitudinal strain with apical   sparing suggest possible amyloid.  Impressions:  - Speckle type pattern in myocardium and markedly abnormal   basal/mid ventricular longitudinal strain with apical sparing    suggest possible amyloid.  Assessment/Plan:  Status post ICD: Checked in ER and functioning normally. Patient had trouble transmitting remotely. It actually came through to our office today and is functioning normally. He reports no palpitations or device shocks. No additional work up needed from our standpoint.  Chronic systolic CHF: Nonischemic cardiomyopathy with biventricular failure, probably due to ETOH abuse (prior) + HTN.  EF improved 35-40% echo 09/2014 up from 15-20% 2015   [place]. CPX showed severe functional limitation.  RHC 9/15, however, was well compensated with good cardiac output. Currently compensated.   Positive troponins but flat. No chest pain. Doubt ACS, most likely secondary to cardiomyopathy and with CKD.    H/o CVA: He is on ASA, and statin.      Hyperlipidemia: Goal LDL < 70 with CVA history.  Continue statin.  Carotid bruit: On left, has history of CVA.  Mild stenosis on carotid dopplers.     CKD: crt 2.66  Bioprosthetic aortic valve: There is a question of possible moderate bioprosthetic valve stenosis.  No significant stenosis noted on ECHO 09/2014  Herpes zoster right arm on acyclovir  Ermalinda Barrios, PA-C  04/29/2015, 10:32 AM    Attending note:  Patient seen and examined. Reviewed records and discussed the case with Ms. Bonnell Public PA-C. Mr. Frazzini presents related to concerns about difficulty doing a remote ICD transmission. Unrelated to this, he has had some generalized weakness with bowel incontinence, also active herpes zoster infection. He does not report any chest pain, palpitations, syncope, or device discharges. He had his device checked both in the ER and via a remote transmission that did go through to our office. Device function is normal.  He is currently on contact and respiratory isolation secondary to active herpes zoster infection. He reports no symptoms this morning. Lungs are clear and nonlabored, cardiac exam reveals RRR with  2/6 systolic murmur. He has a Herpes zoster rash on his right arm. Lab work reviewed. He does have mildly elevated troponin I levels, although flat pattern not consistent with ACS, and he has not had any chest pain. Most likely this is secondary to cardiomyopathy and in the setting of chronic kidney disease. Furthermore he has a known nonischemic cardiomyopathy with documentation of normal coronary arteries at cardiac catheterization in 2014.  At this point do not anticipate any further cardiac workup. Device function is normal. Would continue medical therapy and routine follow-up.  Satira Sark, M.D., F.A.C.C.

## 2015-04-29 NOTE — Discharge Summary (Signed)
Physician Discharge Summary  Patient ID: Keith Young MRN: [id number] DOB/AGE: Dec 07, 1950 64 y.o. Primary Care Physician:Cahlil Sattar L, MD Admit date: 04/28/2015 Discharge date: 04/29/2015    Discharge Diagnoses:   Active Problems:   Essential hypertension, benign   CAD (coronary artery disease)   ICD (implantable cardioverter-defibrillator) in place   Chest pain   Elevated troponin  Shingles   Medication List    TAKE these medications        acyclovir 800 MG tablet  Commonly known as:  ZOVIRAX  Take 800 mg by mouth 5 (five) times daily.     aspirin 81 MG EC tablet  Take 1 tablet (81 mg total) by mouth daily.     atorvastatin 80 MG tablet  Commonly known as:  LIPITOR  Take 80 mg by mouth daily.     carvedilol 25 MG tablet  Commonly known as:  COREG  Take 12.5 mg by mouth 2 (two) times daily with a meal.     digoxin 0.125 MG tablet  Commonly known as:  LANOXIN  TAKE   ? BY MOUTH   DAILY     ferrous sulfate 325 (65 FE) MG tablet  Take 325 mg by mouth daily with breakfast.     furosemide 40 MG tablet  Commonly known as:  LASIX  TAKE ONE TABLET   BY MOUTH   DAILY     hydrALAZINE 25 MG tablet  Commonly known as:  APRESOLINE  Take 3 tablets by mouth 3 (three) times daily.     insulin lispro 100 UNIT/ML injection  Commonly known as:  HUMALOG  Inject 2-6 Units into the skin 3 (three) times daily with meals.     isosorbide dinitrate 20 MG tablet  Commonly known as:  ISORDIL  Take 2 tablets by mouth 3 (three) times daily.     losartan 25 MG tablet  Commonly known as:  COZAAR  TAKE ONE TABLET   BY MOUTH   DAILY     pantoprazole 40 MG tablet  Commonly known as:  PROTONIX  TAKE 1 TABLET BY MOUTH ONCE A DAY.     traMADol 50 MG tablet  Commonly known as:  ULTRAM  Take 50 mg by mouth every 6 (six) hours as needed.     Vitamin D3 2000 UNITS Tabs  Take 2,000 mg by mouth daily.        Discharged Condition: Improved    Consults:  Cardiology  Significant Diagnostic Studies: Ct Head Wo Contrast  04/28/2015  CLINICAL DATA:  Weakness for 1 week, recent diagnosis of shingles, nonfunctional pacemaker, altered mental status, type II diabetes mellitus, chronic systolic heart failure, essential benign hypertension, non ischemic cardiomyopathy, COPD, stroke, former smoker EXAM: CT HEAD WITHOUT CONTRAST TECHNIQUE: Contiguous axial images were obtained from the base of the skull through the vertex without intravenous contrast. COMPARISON:  None FINDINGS: Generalized atrophy. Normal ventricular morphology. No midline shift or mass effect. Small vessel chronic ischemic changes of deep cerebral white matter. Encephalomalacia at an old appearing RIGHT posterior temporoparietal infarct. Old LEFT thalamic lacunar, RIGHT pontine, RIGHT cerebellar, and question LEFT external capsule No intracranial hemorrhage, mass lesion, or acute infarction. Visualized paranasal sinuses and mastoid air cells clear. Bones unremarkable. Atherosclerotic calcifications at the carotid siphons. IMPRESSION: Atrophy with small vessel chronic ischemic changes of deep cerebral white matter. Multiple old infarcts as above. No definite acute intracranial abnormalities. Electronically Signed   By: Lavonia Dana M.D.   On: 04/28/2015 17:41   Dg Chest [facility]  1 View  04/28/2015  CLINICAL DATA:  Altered mental status EXAM: PORTABLE CHEST 1 VIEW COMPARISON:  December 31, 2014 FINDINGS: There is no edema or consolidation. Heart is upper normal in size with pulmonary vascularity within normal limits. Pacemaker lead is attached to the right ventricle. No adenopathy. No appreciable bone lesions. IMPRESSION: No edema or consolidation. Electronically Signed   By: Lowella Grip III M.D.   On: 04/28/2015 16:59    Lab Results: Basic Metabolic Panel:  Recent Labs  04/28/15 1628 04/29/15 0401  NA 130* 131*  K 4.3 3.8  CL 98* 103  CO2 25 23  GLUCOSE 127* 88  BUN 54* 44*  CREATININE  2.66* 1.84*  CALCIUM 9.1 8.5*   Liver Function Tests:  Recent Labs  04/29/15 0401  AST 51*  ALT 34  ALKPHOS 85  BILITOT 0.5  PROT 7.4  ALBUMIN 2.7*     CBC:  Recent Labs  04/28/15 1628 04/29/15 0401  WBC 2.9* 3.2*  NEUTROABS 1.4*  --   HGB 11.7* 11.7*  HCT 35.4* 35.5*  MCV 87.4 86.4  PLT 128* 122*    No results found for this or any previous visit (from the past 240 hour(s)).   Hospital Course: This is a 64 year old who was doing a pacemaker check from his assisted living facility and had some trouble with the transmission. He then called his pacemaker company and was describing his symptoms which included being weak and because of concerns that he might have pacemaker malfunction and he was directed to come to the emergency department. His workup in the emergency department was generally nonrevealing except he had elevated troponin level. This remained at a stable level. He did not have chest pain. Cardiology consultation was obtained and it was felt that his pacemaker was functioning normally it was checked and that he did not have any acute cardiac issues and did not need any further inpatient testing. He will be sent back to his assisted living facility for follow-up there  Discharge Exam: Blood pressure 120/74, pulse 83, temperature 98.3 F (36.8 C), temperature source Oral, resp. rate 22, height 5\' 2"  (1.575 m), weight 57.199 kg (126 lb 1.6 oz), SpO2 100 %. He is awake and alert. His chest is clear. He has an extensive rash of shingles on his right arm  Disposition: Return to assisted living facility      Discharge Instructions    Discharge patient    Complete by:  As directed              Signed: Hawke Villalpando L   04/29/2015, 11:01 AM

## 2015-04-29 NOTE — Clinical Social Work Note (Signed)
Clinical Social Work Assessment  Patient Details  Name: Keith Young MRN: [id number] Date of Birth: May 27, 1951  Date of referral:  04/29/15               Reason for consult:  Facility Placement                Permission sought to share information with:    Permission granted to share information::     Name::        Agency::     Relationship::     Contact Information:     Housing/Transportation Living arrangements for the past 2 months:  [place] of Information:  Patient, Facility Patient Interpreter Needed:  None Criminal Activity/Legal Involvement Pertinent to Current Situation/Hospitalization:  No - Comment as needed Significant Relationships:  Siblings Lives with:  Facility Resident Do you feel safe going back to the place where you live?  Yes Need for family participation in patient care:  Yes (Comment)  Care giving concerns:  Facility resident.    Social Worker assessment / plan: CSW spoke with Mortimer Fries, owner of [facility] and sister of patient.  She indicated that patient has been a resident at the facility for a year.  She stated that at baseline, he ambulates unassisted and compeltes ADLs unassisted. Mrs. [name] advised that at the onset of patient's recent illness which lead to hospitalization, he was having difficulty ambulating and completing ADLs.  She statedthat patient can return to the facility.  Patient confirmed Mrs. Wallace's statements. He advised that he planned to return to the facility at discharge.  CSW discussed with Mrs. [name] that patient would be discharged today and that he would not have an FL2 completed as he was in observation less than 24 hours.    Employment status:  Disabled (Comment on whether or not currently receiving Disability) Insurance information:  Managed Medicare PT Recommendations:  Not assessed at this time Information / Referral to community resources:     Patient/Family's Response to care: Patient and family are  agreeable for patient to return to the facility upon discharge.   Patient/Family's Understanding of and Emotional Response to Diagnosis, Current Treatment, and Prognosis: Patient and family understanding of patient's diagnosis, treatment and prognosis.    Emotional Assessment Appearance:  Appears stated age Attitude/Demeanor/Rapport:   (Cooperative) Affect (typically observed):  Calm Orientation:  Oriented to Self, Oriented to Place, Oriented to  Time, Oriented to Situation Alcohol / Substance use:  Illicit Drugs Psych involvement (Current and /or in the community):  No (Comment)  Discharge Needs  Concerns to be addressed:  Discharge Planning Concerns Readmission within the last 30 days:  No Current discharge risk:  None Barriers to Discharge:  No Barriers Identified   Ihor Gully, LCSW 04/29/2015, 10:41 AM [phone]

## 2015-04-29 NOTE — Progress Notes (Signed)
Subjective: He came in yesterday because of weakness and worries about his pacemaker not transmitting. He was found to have elevated troponin and he was brought in for observation. Cardiology consultation is underway  Objective: Vital signs in last 24 hours: Temp:  [97.6 F (36.4 C)-98.3 F (36.8 C)] 98.3 F (36.8 C) (11/30 0440) Pulse Rate:  [66-83] 83 (11/30 0440) Resp:  [13-23] 22 (11/30 0440) BP: (110-173)/(66-94) 120/74 mmHg (11/30 0440) SpO2:  [98 %-100 %] 100 % (11/30 0440) Weight:  [57.199 kg (126 lb 1.6 oz)-58.06 kg (128 lb)] 57.199 kg (126 lb 1.6 oz) (11/29 2359) Weight change:  Last BM Date: 04/28/15  Intake/Output from previous day: 11/29 0701 - 11/30 0700 In: 396.3 [P.O.:120; I.V.:276.3] Out: -   PHYSICAL EXAM General appearance: alert, cooperative and mild distress Resp: clear to auscultation bilaterally Cardio: regular rate and rhythm GI: soft, non-tender; bowel sounds normal; no masses,  no organomegaly Extremities: He has extensive shingles rash on his right arm  Lab Results:  Results for orders placed or performed during the hospital encounter of 04/28/15 (from the past 48 hour(s))  Basic metabolic panel     Status: Abnormal   Collection Time: 04/28/15  4:28 PM  Result Value Ref Range   Sodium 130 (L) 135 - 145 mmol/L   Potassium 4.3 3.5 - 5.1 mmol/L   Chloride 98 (L) 101 - 111 mmol/L   CO2 25 22 - 32 mmol/L   Glucose, Bld 127 (H) 65 - 99 mg/dL   BUN 54 (H) 6 - 20 mg/dL   Creatinine, Ser 2.66 (H) 0.61 - 1.24 mg/dL   Calcium 9.1 8.9 - 10.3 mg/dL   GFR calc non Af Amer 24 (L) >60 mL/min   GFR calc Af Amer 28 (L) >60 mL/min    Comment: (NOTE) The eGFR has been calculated using the CKD EPI equation. This calculation has not been validated in all clinical situations. eGFR's persistently <60 mL/min signify possible Chronic Kidney Disease.    Anion gap 7 5 - 15  CBC with Differential/Platelet     Status: Abnormal   Collection Time: 04/28/15  4:28 PM   Result Value Ref Range   WBC 2.9 (L) 4.0 - 10.5 K/uL   RBC 4.05 (L) 4.22 - 5.81 MIL/uL   Hemoglobin 11.7 (L) 13.0 - 17.0 g/dL   HCT 35.4 (L) 39.0 - 52.0 %   MCV 87.4 78.0 - 100.0 fL   MCH 28.9 26.0 - 34.0 pg   MCHC 33.1 30.0 - 36.0 g/dL   RDW 12.7 11.5 - 15.5 %   Platelets 128 (L) 150 - 400 K/uL   Neutrophils Relative % 48 %   Neutro Abs 1.4 (L) 1.7 - 7.7 K/uL   Lymphocytes Relative 38 %   Lymphs Abs 1.1 0.7 - 4.0 K/uL   Monocytes Relative 12 %   Monocytes Absolute 0.4 0.1 - 1.0 K/uL   Eosinophils Relative 1 %   Eosinophils Absolute 0.0 0.0 - 0.7 K/uL   Basophils Relative 1 %   Basophils Absolute 0.0 0.0 - 0.1 K/uL  Brain natriuretic peptide     Status: Abnormal   Collection Time: 04/28/15  4:28 PM  Result Value Ref Range   B Natriuretic Peptide 196.0 (H) 0.0 - 100.0 pg/mL  Troponin I     Status: Abnormal   Collection Time: 04/28/15  4:28 PM  Result Value Ref Range   Troponin I 0.30 (H) <0.031 ng/mL    Comment:  PERSISTENTLY INCREASED TROPONIN VALUES IN THE RANGE OF 0.04-0.49 ng/mL CAN BE SEEN IN:       -UNSTABLE ANGINA       -CONGESTIVE HEART FAILURE       -MYOCARDITIS       -CHEST TRAUMA       -ARRYHTHMIAS       -LATE PRESENTING MYOCARDIAL INFARCTION       -COPD   CLINICAL FOLLOW-UP RECOMMENDED.   Urinalysis, Routine w reflex microscopic (not at [facility])     Status: Abnormal   Collection Time: 04/28/15  5:40 PM  Result Value Ref Range   Color, Urine YELLOW YELLOW   APPearance CLEAR CLEAR   Specific Gravity, Urine 1.020 1.005 - 1.030   pH 5.5 5.0 - 8.0   Glucose, UA NEGATIVE NEGATIVE mg/dL   Hgb urine dipstick NEGATIVE NEGATIVE   Bilirubin Urine NEGATIVE NEGATIVE   Ketones, ur NEGATIVE NEGATIVE mg/dL   Protein, ur 100 (A) NEGATIVE mg/dL   Nitrite NEGATIVE NEGATIVE   Leukocytes, UA NEGATIVE NEGATIVE  Urine microscopic-add on     Status: Abnormal   Collection Time: 04/28/15  5:40 PM  Result Value Ref Range   Squamous Epithelial / LPF 0-5 (A) NONE SEEN    WBC, UA 0-5 0 - 5 WBC/hpf   RBC / HPF NONE SEEN 0 - 5 RBC/hpf   Bacteria, UA RARE (A) NONE SEEN   Casts HYALINE CASTS (A) NEGATIVE  Troponin I     Status: Abnormal   Collection Time: 04/28/15  6:57 PM  Result Value Ref Range   Troponin I 0.26 (H) <0.031 ng/mL    Comment:        PERSISTENTLY INCREASED TROPONIN VALUES IN THE RANGE OF 0.04-0.49 ng/mL CAN BE SEEN IN:       -UNSTABLE ANGINA       -CONGESTIVE HEART FAILURE       -MYOCARDITIS       -CHEST TRAUMA       -ARRYHTHMIAS       -LATE PRESENTING MYOCARDIAL INFARCTION       -COPD   CLINICAL FOLLOW-UP RECOMMENDED.   CBG monitoring, ED     Status: Abnormal   Collection Time: 04/28/15  9:12 PM  Result Value Ref Range   Glucose-Capillary 55 (L) 65 - 99 mg/dL  Troponin I     Status: Abnormal   Collection Time: 04/28/15  9:49 PM  Result Value Ref Range   Troponin I 0.29 (H) <0.031 ng/mL    Comment:        PERSISTENTLY INCREASED TROPONIN VALUES IN THE RANGE OF 0.04-0.49 ng/mL CAN BE SEEN IN:       -UNSTABLE ANGINA       -CONGESTIVE HEART FAILURE       -MYOCARDITIS       -CHEST TRAUMA       -ARRYHTHMIAS       -LATE PRESENTING MYOCARDIAL INFARCTION       -COPD   CLINICAL FOLLOW-UP RECOMMENDED.   CBG monitoring, ED     Status: Abnormal   Collection Time: 04/28/15 10:10 PM  Result Value Ref Range   Glucose-Capillary 117 (H) 65 - 99 mg/dL  Glucose, capillary     Status: None   Collection Time: 04/29/15 12:32 AM  Result Value Ref Range   Glucose-Capillary 83 65 - 99 mg/dL  CBC     Status: Abnormal   Collection Time: 04/29/15  4:01 AM  Result Value Ref Range   WBC 3.2 (L) 4.0 -  10.5 K/uL   RBC 4.11 (L) 4.22 - 5.81 MIL/uL   Hemoglobin 11.7 (L) 13.0 - 17.0 g/dL   HCT 35.5 (L) 39.0 - 52.0 %   MCV 86.4 78.0 - 100.0 fL   MCH 28.5 26.0 - 34.0 pg   MCHC 33.0 30.0 - 36.0 g/dL   RDW 12.6 11.5 - 15.5 %   Platelets 122 (L) 150 - 400 K/uL  Comprehensive metabolic panel     Status: Abnormal   Collection Time: 04/29/15  4:01 AM   Result Value Ref Range   Sodium 131 (L) 135 - 145 mmol/L   Potassium 3.8 3.5 - 5.1 mmol/L   Chloride 103 101 - 111 mmol/L   CO2 23 22 - 32 mmol/L   Glucose, Bld 88 65 - 99 mg/dL   BUN 44 (H) 6 - 20 mg/dL   Creatinine, Ser 1.84 (H) 0.61 - 1.24 mg/dL   Calcium 8.5 (L) 8.9 - 10.3 mg/dL   Total Protein 7.4 6.5 - 8.1 g/dL   Albumin 2.7 (L) 3.5 - 5.0 g/dL   AST 51 (H) 15 - 41 U/L   ALT 34 17 - 63 U/L   Alkaline Phosphatase 85 38 - 126 U/L   Total Bilirubin 0.5 0.3 - 1.2 mg/dL   GFR calc non Af Amer 37 (L) >60 mL/min   GFR calc Af Amer 43 (L) >60 mL/min    Comment: (NOTE) The eGFR has been calculated using the CKD EPI equation. This calculation has not been validated in all clinical situations. eGFR's persistently <60 mL/min signify possible Chronic Kidney Disease.    Anion gap 5 5 - 15  Troponin I     Status: Abnormal   Collection Time: 04/29/15  4:01 AM  Result Value Ref Range   Troponin I 0.31 (H) <0.031 ng/mL    Comment:        PERSISTENTLY INCREASED TROPONIN VALUES IN THE RANGE OF 0.04-0.49 ng/mL CAN BE SEEN IN:       -UNSTABLE ANGINA       -CONGESTIVE HEART FAILURE       -MYOCARDITIS       -CHEST TRAUMA       -ARRYHTHMIAS       -LATE PRESENTING MYOCARDIAL INFARCTION       -COPD   CLINICAL FOLLOW-UP RECOMMENDED.   Glucose, capillary     Status: None   Collection Time: 04/29/15  7:50 AM  Result Value Ref Range   Glucose-Capillary 73 65 - 99 mg/dL   Comment 1 Notify RN     ABGS No results for input(s): PHART, PO2ART, TCO2, HCO3 in the last 72 hours.  Invalid input(s): PCO2 CULTURES No results found for this or any previous visit (from the past 240 hour(s)). Studies/Results: Ct Head Wo Contrast  04/28/2015  CLINICAL DATA:  Weakness for 1 week, recent diagnosis of shingles, nonfunctional pacemaker, altered mental status, type II diabetes mellitus, chronic systolic heart failure, essential benign hypertension, non ischemic cardiomyopathy, COPD, stroke, former  smoker EXAM: CT HEAD WITHOUT CONTRAST TECHNIQUE: Contiguous axial images were obtained from the base of the skull through the vertex without intravenous contrast. COMPARISON:  None FINDINGS: Generalized atrophy. Normal ventricular morphology. No midline shift or mass effect. Small vessel chronic ischemic changes of deep cerebral white matter. Encephalomalacia at an old appearing RIGHT posterior temporoparietal infarct. Old LEFT thalamic lacunar, RIGHT pontine, RIGHT cerebellar, and question LEFT external capsule No intracranial hemorrhage, mass lesion, or acute infarction. Visualized paranasal sinuses and mastoid air cells clear.  Bones unremarkable. Atherosclerotic calcifications at the carotid siphons. IMPRESSION: Atrophy with small vessel chronic ischemic changes of deep cerebral white matter. Multiple old infarcts as above. No definite acute intracranial abnormalities. Electronically Signed   By: Lavonia Dana M.D.   On: 04/28/2015 17:41   Dg Chest Port 1 View  04/28/2015  CLINICAL DATA:  Altered mental status EXAM: PORTABLE CHEST 1 VIEW COMPARISON:  December 31, 2014 FINDINGS: There is no edema or consolidation. Heart is upper normal in size with pulmonary vascularity within normal limits. Pacemaker lead is attached to the right ventricle. No adenopathy. No appreciable bone lesions. IMPRESSION: No edema or consolidation. Electronically Signed   By: Lowella Grip III M.D.   On: 04/28/2015 16:59    Medications:  Prior to Admission:  Prescriptions prior to admission  Medication Sig Dispense Refill Last Dose  . acyclovir (ZOVIRAX) 800 MG tablet Take 800 mg by mouth 5 (five) times daily.    04/28/2015 at Unknown time  . aspirin 81 MG EC tablet Take 1 tablet (81 mg total) by mouth daily.   04/28/2015 at Unknown time  . atorvastatin (LIPITOR) 80 MG tablet Take 80 mg by mouth daily.   04/28/2015 at Unknown time  . carvedilol (COREG) 25 MG tablet Take 12.5 mg by mouth 2 (two) times daily with a meal.     04/28/2015 at [facility]  . Cholecalciferol (VITAMIN D3) 2000 UNITS TABS Take 2,000 mg by mouth daily.   04/28/2015 at Unknown time  . digoxin (LANOXIN) 0.125 MG tablet TAKE   ? BY MOUTH   DAILY (Patient taking differently: TAKE ONE-HALF TABLET BY MOUTH ONCE DAILY) 15 tablet 3 04/28/2015 at Unknown time  . ferrous sulfate 325 (65 FE) MG tablet Take 325 mg by mouth daily with breakfast.   04/28/2015 at Unknown time  . furosemide (LASIX) 40 MG tablet TAKE ONE TABLET   BY MOUTH   DAILY 30 tablet 3 04/28/2015 at Unknown time  . hydrALAZINE (APRESOLINE) 25 MG tablet Take 3 tablets by mouth 3 (three) times daily.   04/28/2015 at Unknown time  . insulin lispro (HUMALOG) 100 UNIT/ML injection Inject 2-6 Units into the skin 3 (three) times daily with meals.   04/28/2015 at Unknown time  . isosorbide dinitrate (ISORDIL) 20 MG tablet Take 2 tablets by mouth 3 (three) times daily.   04/28/2015 at Unknown time  . losartan (COZAAR) 25 MG tablet TAKE ONE TABLET   BY MOUTH   DAILY 30 tablet 3 04/28/2015 at Unknown time  . pantoprazole (PROTONIX) 40 MG tablet TAKE 1 TABLET BY MOUTH ONCE A DAY. 30 tablet 5 04/28/2015 at Unknown time  . traMADol (ULTRAM) 50 MG tablet Take 50 mg by mouth every 6 (six) hours as needed.    04/27/2015 at Unknown time   Scheduled: . acyclovir  800 mg Oral 5 X Daily  . aspirin  81 mg Oral Daily  . atorvastatin  80 mg Oral Daily  . carvedilol  12.5 mg Oral BID WC  . digoxin  62.5 mcg Oral Daily  . enoxaparin (LOVENOX) injection  40 mg Subcutaneous Q24H  . ferrous sulfate  325 mg Oral Q breakfast  . hydrALAZINE  75 mg Oral TID  . insulin aspart  0-9 Units Subcutaneous TID WC  . isosorbide dinitrate  40 mg Oral TID  . losartan  25 mg Oral Daily  . pantoprazole  40 mg Oral Daily   Continuous: . sodium chloride 75 mL/hr at 04/29/15 0110   TMA:[id number] **OR** acetaminophen,  ondansetron **OR** ondansetron (ZOFRAN) IV  Assesment: He has known multiple cardiac issues and had trouble  with transmission from his pacemaker. His troponin level was elevated. This has been flat. He has no complaints today. Active Problems:   Essential hypertension, benign   CAD (coronary artery disease)   ICD (implantable cardioverter-defibrillator) in place   Chest pain   Elevated troponin    Plan: Cardiology consultation has been done. It is not felt that he needs any inpatient workup. I think he can be discharged back to his assisted living facility    LOS: 1 day   Aldahir Litaker L 04/29/2015, 10:27 AM

## 2015-04-29 NOTE — Care Management Note (Signed)
Case Management Note  Patient Details  Name: Keith Young MRN: [id number] Date of Birth: 10-26-1950  Subjective/Objective:                  Pt admitted from [facility] with CP. Pt will return home at discharge.   Action/Plan: CSW is aware and willl arrange discharge to facility today.  Expected Discharge Date:                  Expected Discharge Plan:  Group Home  In-House Referral:  Clinical Social Work  Discharge planning Services  CM Consult  Post Acute Care Choice:  NA Choice offered to:  NA  DME Arranged:    DME Agency:     HH Arranged:    [place] Agency:     Status of Service:  Completed, signed off  Medicare Important Message Given:    Date Medicare IM Given:    Medicare IM give by:    Date Additional Medicare IM Given:    Additional Medicare Important Message give by:     If discussed at [place] of Stay Meetings, dates discussed:    Additional Comments:  Joylene Draft, RN 04/29/2015, 11:40 AM

## 2015-04-30 LAB — HEMOGLOBIN A1C
Hgb A1c MFr Bld: 6.2 % — ABNORMAL HIGH (ref 4.8–5.6)
Mean Plasma Glucose: 131 mg/dL

## 2015-04-30 NOTE — Progress Notes (Signed)
Remote ICD transmission.   

## 2015-05-06 ENCOUNTER — Other Ambulatory Visit (HOSPITAL_COMMUNITY): Payer: Medicare HMO

## 2015-05-06 ENCOUNTER — Encounter (HOSPITAL_COMMUNITY): Payer: Medicare HMO | Attending: Hematology & Oncology

## 2015-05-06 DIAGNOSIS — D649 Anemia, unspecified: Secondary | ICD-10-CM | POA: Insufficient documentation

## 2015-05-11 ENCOUNTER — Encounter (HOSPITAL_COMMUNITY): Payer: Self-pay

## 2015-05-11 LAB — CUP PACEART REMOTE DEVICE CHECK
Battery Remaining Longevity: 78 mo
Battery Remaining Percentage: 100 %
HighPow Impedance: 62 Ohm
Implantable Lead Implant Date: 20140617
Implantable Lead Location: 753862
Implantable Lead Model: 180
Implantable Lead Serial Number: 310936
Lead Channel Setting Pacing Amplitude: 3 V
Lead Channel Setting Sensing Sensitivity: 0.6 mV
MDC IDC MSMT LEADCHNL RV IMPEDANCE VALUE: 448 Ohm
MDC IDC MSMT LEADCHNL RV PACING THRESHOLD AMPLITUDE: 3 V
MDC IDC MSMT LEADCHNL RV PACING THRESHOLD PULSEWIDTH: 0.3 ms
MDC IDC PG SERIAL: 436636
MDC IDC SESS DTM: 20161129213100
MDC IDC SET LEADCHNL RV PACING PULSEWIDTH: 1.2 ms
MDC IDC STAT BRADY RV PERCENT PACED: 0 %

## 2015-05-12 ENCOUNTER — Encounter: Payer: Self-pay | Admitting: Cardiology

## 2015-05-20 ENCOUNTER — Encounter (HOSPITAL_BASED_OUTPATIENT_CLINIC_OR_DEPARTMENT_OTHER): Payer: Medicare HMO

## 2015-05-20 VITALS — BP 114/61 | HR 64 | Temp 97.9°F | Resp 18 | Wt 119.0 lb

## 2015-05-20 DIAGNOSIS — D72822 Plasmacytosis: Secondary | ICD-10-CM

## 2015-05-20 DIAGNOSIS — N189 Chronic kidney disease, unspecified: Secondary | ICD-10-CM

## 2015-05-20 DIAGNOSIS — D631 Anemia in chronic kidney disease: Secondary | ICD-10-CM

## 2015-05-20 DIAGNOSIS — D649 Anemia, unspecified: Secondary | ICD-10-CM | POA: Diagnosis present

## 2015-05-20 DIAGNOSIS — N183 Chronic kidney disease, stage 3 (moderate): Secondary | ICD-10-CM

## 2015-05-20 LAB — CBC
HCT: 34.5 % — ABNORMAL LOW (ref 39.0–52.0)
Hemoglobin: 10.9 g/dL — ABNORMAL LOW (ref 13.0–17.0)
MCH: 28.3 pg (ref 26.0–34.0)
MCHC: 31.6 g/dL (ref 30.0–36.0)
MCV: 89.6 fL (ref 78.0–100.0)
PLATELETS: 116 10*3/uL — AB (ref 150–400)
RBC: 3.85 MIL/uL — ABNORMAL LOW (ref 4.22–5.81)
RDW: 13.5 % (ref 11.5–15.5)
WBC: 3.1 10*3/uL — AB (ref 4.0–10.5)

## 2015-05-20 MED ORDER — DARBEPOETIN ALFA 60 MCG/0.3ML IJ SOSY
PREFILLED_SYRINGE | INTRAMUSCULAR | Status: AC
  Filled 2015-05-20: qty 0.3

## 2015-05-20 MED ORDER — DARBEPOETIN ALFA 60 MCG/0.3ML IJ SOSY
50.0000 ug | PREFILLED_SYRINGE | Freq: Once | INTRAMUSCULAR | Status: AC
  Administered 2015-05-20: 50 ug via SUBCUTANEOUS

## 2015-05-20 NOTE — Progress Notes (Signed)
Keith Young presents today for injection per MD orders. Aranesp 52mcg administered SQ in right Abdomen. Administration without incident. Patient tolerated well. Weight loss reported to Dr.  Muse as well as one episode of emesis.  She recommends that the patient see his PCP, patient and caregiver aware.

## 2015-05-20 NOTE — Progress Notes (Signed)
Keith Young presented for labwork. Labs per MD order drawn via Peripheral Line 23 gauge needle inserted in left forearm  Good blood return present. Procedure without incident.  Needle removed intact. Patient tolerated procedure well.

## 2015-05-20 NOTE — Patient Instructions (Signed)
[place] at [facility] Discharge Instructions  RECOMMENDATIONS MADE BY THE CONSULTANT AND ANY TEST RESULTS WILL BE SENT TO YOUR REFERRING PHYSICIAN.  Aranesp 71mcg today.    Thank you for choosing [place] at [facility] to provide your oncology and hematology care.  To afford each patient quality time with our provider, please arrive at least 15 minutes before your scheduled appointment time.    You need to re-schedule your appointment should you arrive 10 or more minutes late.  We strive to give you quality time with our providers, and arriving late affects you and other patients whose appointments are after yours.  Also, if you no show three or more times for appointments you may be dismissed from the clinic at the providers discretion.     Again, thank you for choosing [facility].  Our hope is that these requests will decrease the amount of time that you wait before being seen by our physicians.       _____________________________________________________________  Should you have questions after your visit to [facility], please contact our office at [phone] between the hours of 8:30 a.m. and 4:30 p.m.  Voicemails left after 4:30 p.m. will not be returned until the following business day.  For prescription refill requests, have your pharmacy contact our office.

## 2015-05-21 LAB — FERRITIN: FERRITIN: 244 ng/mL (ref 24–336)

## 2015-05-22 ENCOUNTER — Telehealth (HOSPITAL_COMMUNITY): Payer: Self-pay | Admitting: Emergency Medicine

## 2015-05-22 NOTE — Telephone Encounter (Signed)
-----   Message from Baird Cancer, PA-C sent at 05/22/2015 12:05 PM EST ----- I have reviewed all lab results which are normal or stable. Please inform the patient.

## 2015-05-22 NOTE — Telephone Encounter (Signed)
Notified his care facility of his labs results were normal

## 2015-05-28 ENCOUNTER — Telehealth (HOSPITAL_COMMUNITY): Payer: Self-pay | Admitting: Hematology & Oncology

## 2015-05-28 NOTE — Telephone Encounter (Signed)
[employer]ET[employer] APPROVED [id number] ARANESP 05/06/15-08/26/15

## 2015-06-03 ENCOUNTER — Encounter (HOSPITAL_COMMUNITY): Payer: Medicare HMO | Attending: Hematology & Oncology

## 2015-06-03 ENCOUNTER — Encounter (HOSPITAL_COMMUNITY): Payer: Self-pay

## 2015-06-03 VITALS — BP 117/74 | HR 60 | Temp 97.9°F | Resp 18 | Wt 118.2 lb

## 2015-06-03 DIAGNOSIS — D649 Anemia, unspecified: Secondary | ICD-10-CM | POA: Insufficient documentation

## 2015-06-03 DIAGNOSIS — N183 Chronic kidney disease, stage 3 (moderate): Secondary | ICD-10-CM

## 2015-06-03 DIAGNOSIS — N189 Chronic kidney disease, unspecified: Secondary | ICD-10-CM

## 2015-06-03 DIAGNOSIS — D631 Anemia in chronic kidney disease: Secondary | ICD-10-CM | POA: Diagnosis not present

## 2015-06-03 DIAGNOSIS — D72822 Plasmacytosis: Secondary | ICD-10-CM

## 2015-06-03 LAB — CBC
HCT: 35.1 % — ABNORMAL LOW (ref 39.0–52.0)
Hemoglobin: 11 g/dL — ABNORMAL LOW (ref 13.0–17.0)
MCH: 28.3 pg (ref 26.0–34.0)
MCHC: 31.3 g/dL (ref 30.0–36.0)
MCV: 90.2 fL (ref 78.0–100.0)
PLATELETS: 112 10*3/uL — AB (ref 150–400)
RBC: 3.89 MIL/uL — AB (ref 4.22–5.81)
RDW: 14.6 % (ref 11.5–15.5)
WBC: 2.9 10*3/uL — AB (ref 4.0–10.5)

## 2015-06-03 LAB — FERRITIN: FERRITIN: 204 ng/mL (ref 24–336)

## 2015-06-03 MED ORDER — DARBEPOETIN ALFA 60 MCG/0.3ML IJ SOSY
50.0000 ug | PREFILLED_SYRINGE | Freq: Once | INTRAMUSCULAR | Status: AC
  Administered 2015-06-03: 50 ug via SUBCUTANEOUS

## 2015-06-03 MED ORDER — DARBEPOETIN ALFA 60 MCG/0.3ML IJ SOSY
PREFILLED_SYRINGE | INTRAMUSCULAR | Status: AC
  Filled 2015-06-03: qty 0.3

## 2015-06-03 NOTE — Progress Notes (Signed)
..  Keith Young presents today for injection per the provider's orders.  aranesp administration without incident; see MAR for injection details.  Patient tolerated procedure well and without incident.  No questions or complaints noted at this time.

## 2015-06-03 NOTE — Patient Instructions (Signed)
Aranesp given today hgb 11

## 2015-06-17 ENCOUNTER — Other Ambulatory Visit (HOSPITAL_COMMUNITY): Payer: Medicare HMO

## 2015-06-17 ENCOUNTER — Ambulatory Visit (HOSPITAL_COMMUNITY): Payer: Medicare HMO | Admitting: Oncology

## 2015-06-17 ENCOUNTER — Encounter (HOSPITAL_COMMUNITY): Payer: Medicare HMO

## 2015-06-22 ENCOUNTER — Other Ambulatory Visit: Payer: Self-pay | Admitting: Internal Medicine

## 2015-06-22 ENCOUNTER — Other Ambulatory Visit: Payer: Self-pay | Admitting: Cardiology

## 2015-07-14 ENCOUNTER — Telehealth (INDEPENDENT_AMBULATORY_CARE_PROVIDER_SITE_OTHER): Payer: Self-pay | Admitting: Internal Medicine

## 2015-07-14 ENCOUNTER — Telehealth: Payer: Self-pay | Admitting: Internal Medicine

## 2015-07-14 MED ORDER — PANTOPRAZOLE SODIUM 40 MG PO TBEC: 40.0000 mg | DELAYED_RELEASE_TABLET | Freq: Every day | ORAL | Status: DC

## 2015-07-14 NOTE — Telephone Encounter (Signed)
Routing to the refill box. 

## 2015-07-14 NOTE — Telephone Encounter (Signed)
RX done please fax copy to pharmacy and to [facility].

## 2015-07-14 NOTE — Telephone Encounter (Signed)
Addressed. See other phone note.

## 2015-07-14 NOTE — Telephone Encounter (Signed)
rx's have been faxed to the pharmacy and to [name].

## 2015-07-14 NOTE — Telephone Encounter (Signed)
[name]  PATIENTS CARETAKER AT [facility]   CALLED AND WANTS TO HAVE A COPY OF THE PRESCRIPTION SENT TO HIM TO PUT INTO THE PATIENTS CHART.  I TOLD HIM THAT MAY NOT BE A POSSIBILITY   SEE PHONE NOTE THAT WAS PUT IN FROM [facility] OFFICE ABOUT MEDICATION REFILL, THEY CALLED THE WRONG OFFICE AT FIRST AND I ATTACHED [place] TO THE PHONE NOTE PUT IN BY [facility] OFFICE

## 2015-07-14 NOTE — Telephone Encounter (Signed)
Jeronimo Greaves from [facility] called saying Mr. Giang is completely out of Protonix medication. He needs a refill Rx sent to [facility]. The phone number for [facility] is: [phone] and you can ask for Sam the pharmacist if needed.   [name] also needs a copy of the refill Rx in his records at the [facility] location. Feel free to contact him by calling ph# [phone] and please fax the Rx copy to [phone]. [name] would also like a phone call once this has been taken care of.  Thank you.

## 2015-07-27 ENCOUNTER — Ambulatory Visit (INDEPENDENT_AMBULATORY_CARE_PROVIDER_SITE_OTHER): Payer: Medicare HMO | Admitting: *Deleted

## 2015-07-27 DIAGNOSIS — I429 Cardiomyopathy, unspecified: Secondary | ICD-10-CM | POA: Diagnosis not present

## 2015-07-27 NOTE — Progress Notes (Signed)
Remote ICD transmission.   

## 2015-09-02 ENCOUNTER — Encounter: Payer: Self-pay | Admitting: Internal Medicine

## 2015-09-07 NOTE — Assessment & Plan Note (Addendum)
Anemia in the setting of Stage III chronic renal disease followed by Dr. Theador Hawthorne with an element of iron deficiency anemia having received IV iron in the past and an element of anemia of chronic disease in the setting of an elevated ESR.   Work-up thus far has revealed diffuse osteopenia, anemia, chronic kidney disease and bone marrow biopsy showing 10% plasma cells but unfortunately monoclonality from his marrow is undeterminable. Cytogenetics were normal.  No M-spike in serum peripherally and no Bence Jones proteinuria on UPEP.  He is currently on ESA therapy consisting of Aranesp 50 mcg every 2 weeks, beginning on 01/21/2015.  His last injection was 3 months ago with subsequent noncompliance/no-show:  Oncology Flowsheet 05/20/2015 06/03/2015  Darbepoetin Alfa (ARANESP) [name] 50 mcg 50 mcg    Labs today: CBC, ferritin  Labs every 2 weeks: CBC Labs every 90 days: Ferritin  On 08/31/2015, the patient had laboratory work performed by Dr. Luan Pulling that demonstrated a significant change in his hemoglobin, down to 8.2 g/dL, in addition to positive stool cards for blood. Dr. Luan Pulling is referred the patient to gastroenterology and he has a consultation appointment upcoming at the beginning of May 2017.  Repeat labs today demonstrate a stable hemoglobin 8.5 g/dL. I learned that due to the lapse since his last Aranesp injection, his authorization has expired.  We will work on getting this re-authorized.  Given his cardiac history, GI blood loss, inability to administer Aranesp today, and anemia, I will offer the patient 1 unit of packed red blood cells today to maintain a hemoglobin greater than 9.0 g/dL. The patient is agreeable to this. We will administer 1 unit of blood today in the clinic.  His anemia of chronic renal disease does not explain his persistent thrombocytopenia.  We may need to consider a repeat bone marrow aspiration and biopsy in the future.  He'll return in 2 weeks for follow-up appointment  in further hematologic recommendations.

## 2015-09-07 NOTE — Progress Notes (Signed)
Keith Bogus, MD [place] [address]  Anemia in chronic kidney disease - Plan: CBC, Ferritin, Ferritin, CBC, Ferritin, Iron and TIBC, Iron and TIBC, Practitioner attestation of consent, Complete patient signature process for consent form, Care order/instruction, Type and screen, Prepare RBC, Prepare RBC, Type and screen, Practitioner attestation of consent, Complete patient signature process for consent form, Care order/instruction, DISCONTINUED: 0.9 %  sodium chloride infusion, DISCONTINUED: sodium chloride flush (NS) 0.9 % injection 10 mL, CANCELED: Transfuse RBC  CURRENT THERAPY: Aranesp 50 mcg every 2 weeks, beginning on 01/21/2015.  INTERVAL HISTORY: Keith Young 65 y.o. male returns for followup of anemia in the setting of Stage III chronic renal disease followed by Keith Young with an element of iron deficiency anemia and an element of anemia of chronic disease in the setting of an elevated ESR.   I personally reviewed and went over laboratory results with the patient.  The results are noted within this dictation. Labs will be updated today. Hemoglobin is noted to be 8.5 g/dL. Iron studies of course are pending. A laboratory work performed by Keith Young on 08/31/2015 demonstrated a hemoglobin of 8.2 g/dL, normal platelet count, and white blood cell count 2.9. It is also reported that Keith Young found positive occult blood in stool and therefore he has been referred to gastroenterology. He has an appointment at the beginning of May 2017 for consultation.  He has been a no-show since Jan 2017 for labs and aranesp injections.  Given the laps in time, his authorization for Aranesp has been expired. We will work on getting this re-authorized.  The patient denies any complaints, but his caregiver today reports that Keith Young been sleeping more, more fatigue, more sedentary, decreased appetite, etc. I suspect he symptomatically from his severe  anemia.  Given the patient's cardiac history, he is offered a blood transfusion today. Based upon his hemoglobin 8.5 g/dL, we'll will only offer 1 unit of blood. Patient reports that he is agreeable to this plan of action. In the interim, we'll see what his iron studies show, replaced that as needed, and also work on getting his Aranesp reauthorized.  Past Medical History  Diagnosis Date  . Chronic systolic heart failure (HCC)     NYHA Class III  . Type 2 diabetes mellitus ([place])   . Nonischemic cardiomyopathy (HCC)     EF 15-20%  . Essential hypertension, benign   . CKD (chronic kidney disease) stage 3, GFR 30-59 ml/min   . Anemia   . Hyperlipidemia   . History of stroke   . Bell's palsy   . Cardiomyopathy ([place])   . Aortic insufficiency   . Alcoholism in remission ([place])   . Noncompliance with medication regimen   . NSVT (nonsustained ventricular tachycardia) ([place])   . Cardiac defibrillator in situ   . COPD (chronic obstructive pulmonary disease) ([place])   . CVA (cerebral infarction) 01/2013  . Productive cough 08/2013    With brown sputum   . SOB (shortness of breath) on exertion 08/2013  . Numbness of right jaw     Had a stoke in 01/2013. Numbness is occasional, especially when trying to chew.  . At moderate risk for fall   . AICD (automatic cardioverter/defibrillator) present   . Stroke [facility])     weakness of right side from CVA  . Arthritis     has CHF (congestive heart failure), NYHA class III ([place]); Cough; CKD (chronic kidney  disease); Type II or unspecified type diabetes mellitus without mention of complication, not stated as uncontrolled; Essential hypertension, benign; Hyperkalemia; CAD (coronary artery disease); History of stroke; Anemia of chronic kidney failure; Other and unspecified hyperlipidemia; Acute on chronic systolic heart failure ([place]); Carotid bruit; Chronic systolic CHF (congestive heart failure) ([place]); Elevated LFTs; Anemia in chronic kidney disease; Gastric  polyp; Reflux esophagitis; Anemia; ICD (implantable cardioverter-defibrillator) in place; Chest pain; and Elevated troponin on his problem list.     has No Known Allergies.  Current Outpatient Prescriptions on File Prior to Visit  Medication Sig Dispense Refill  . aspirin 81 MG EC tablet Take 1 tablet (81 mg total) by mouth daily.    Marland Kitchen atorvastatin (LIPITOR) 80 MG tablet Take 80 mg by mouth daily.    . carvedilol (COREG) 25 MG tablet Take 12.5 mg by mouth 2 (two) times daily with a meal.     . Cholecalciferol (VITAMIN D3) 2000 UNITS TABS Take 2,000 mg by mouth daily.    . digoxin (LANOXIN) 0.125 MG tablet TAKE 1/2 TABLET BY MOUTH DAILY 15 tablet 3  . ferrous sulfate 325 (65 FE) MG tablet Take 325 mg by mouth daily with breakfast.    . hydrALAZINE (APRESOLINE) 25 MG tablet Take 3 tablets by mouth 3 (three) times daily.    . insulin lispro (HUMALOG) 100 UNIT/ML injection Inject 2-6 Units into the skin 3 (three) times daily with meals.    . isosorbide dinitrate (ISORDIL) 20 MG tablet Take 2 tablets by mouth 3 (three) times daily.    . pantoprazole (PROTONIX) 40 MG tablet Take 1 tablet (40 mg total) by mouth daily. 30 tablet 11  . traMADol (ULTRAM) 50 MG tablet Take 50 mg by mouth every 6 (six) hours as needed.     Marland Kitchen acyclovir (ZOVIRAX) 800 MG tablet Take 800 mg by mouth 5 (five) times daily.      No current facility-administered medications on file prior to visit.    Past Surgical History  Procedure Laterality Date  . Cardiac defibrillator placement  11/12/12    [employer] Inogen MINI ICD implanted in [place] at [facility]  . Cardiac surgery      bioprostetic AVR per Dr Nelly Laurence note  . Colonoscopy with propofol N/A 04/10/2014    RMR: Colonic Diverticulosis  . Esophagogastroduodenoscopy (egd) with propofol N/A 04/10/2014    RMR: Mild erosive reflux esophagitis. Multiple antral polyps likely hyperplastic status post removal by hot snare cautery technique. Diffusely  abnormal stomach status post gastric biopsy I suspect some of patients anemaia may be due to intermittent oozing from the stomach. It would be difficult and a risky proposition to attempt complete removal of all of his gastric polyps.  . Polypectomy N/A 04/10/2014    Procedure: GASTRIC POLYPECTOMY;  Surgeon: Daneil Dolin, MD;  Location: [facility];  Service: Endoscopy;  Laterality: N/A;  . Biopsy N/A 04/10/2014    Procedure: GASTRIC BIOPSY;  Surgeon: Daneil Dolin, MD;  Location: [facility] ORS;  Service: Endoscopy;  Laterality: N/A;  . Right heart catheterization N/A 02/06/2014    Procedure: RIGHT HEART CATH;  Surgeon: Larey Dresser, MD;  Location: [facility] CATH LAB;  Service: Cardiovascular;  Laterality: N/A;    Denies any headaches, dizziness, double vision, fevers, chills, night sweats, nausea, vomiting, diarrhea, constipation, chest pain, heart palpitations, shortness of breath, blood in stool, black tarry stool, urinary pain, urinary burning, urinary frequency, hematuria.   PHYSICAL EXAMINATION  ECOG PERFORMANCE STATUS: 2 - Symptomatic, <50% confined  to bed  Filed Vitals:   09/08/15 0909  BP: 99/51  Pulse: 73  Temp: 98.5 F (36.9 C)  Resp: 16    GENERAL:alert, no distress, well nourished, well developed, comfortable, cooperative, smiling and hard of hearing and demented, Accompanied by his caregiver today. SKIN: skin color, texture, turgor are normal, no rashes or significant lesions HEAD: Normocephalic, No masses, lesions, tenderness or abnormalities EYES: normal, PERRLA, EOMI, Conjunctiva are pink and non-injected EARS: External ears normal OROPHARYNX:lips, buccal mucosa, and tongue normal and mucous membranes are moist  NECK: supple, no adenopathy, thyroid normal size, non-tender, without nodularity, no stridor, non-tender, trachea midline LYMPH:  no palpable lymphadenopathy BREAST:not examined LUNGS: clear to auscultation and percussion HEART: regular rate & rhythm with a systolic  murmur, 2/6, heard on left and right sternal border. ICD in place. ABDOMEN:abdomen soft, non-tender, normal bowel sounds and no masses or organomegaly BACK: Back symmetric, no curvature. EXTREMITIES:less then 2 second capillary refill, no joint deformities, effusion, or inflammation, no skin discoloration, no cyanosis  NEURO: alert & oriented x 3 with fluent speech, no focal motor/sensory deficits, gait normal    LABORATORY DATA: CBC    Component Value Date/Time   WBC 3.3* 09/08/2015 0953   RBC 2.92* 09/08/2015 0953   RBC 3.04* 10/29/2014 1610   HGB 8.5* 09/08/2015 0953   HCT 25.6* 09/08/2015 0953   PLT 158 09/08/2015 0953   MCV 87.7 09/08/2015 0953   MCH 29.1 09/08/2015 0953   MCHC 33.2 09/08/2015 0953   RDW 12.9 09/08/2015 0953   LYMPHSABS 1.1 04/28/2015 1628   MONOABS 0.4 04/28/2015 1628   EOSABS 0.0 04/28/2015 1628   BASOSABS 0.0 04/28/2015 1628      Chemistry      Component Value Date/Time   NA 131* 04/29/2015 0401   K 3.8 04/29/2015 0401   CL 103 04/29/2015 0401   CO2 23 04/29/2015 0401   BUN 44* 04/29/2015 0401   CREATININE 1.84* 04/29/2015 0401   CREATININE 2.33* 07/09/2014 1145      Component Value Date/Time   CALCIUM 8.5* 04/29/2015 0401   CALCIUM 9.2 03/11/2015 1428   ALKPHOS 85 04/29/2015 0401   AST 51* 04/29/2015 0401   ALT 34 04/29/2015 0401   BILITOT 0.5 04/29/2015 0401     Lab Results  Component Value Date   PROT 7.4 04/29/2015   ALBUMINELP 3.0* 10/23/2014   A1GS 0.2 10/23/2014   A2GS 0.6 10/23/2014   BETS 0.8 10/23/2014   GAMS 2.8* 10/23/2014   MSPIKE Not Observed 01/01/2015   SPEI Comment 10/23/2014   SPECOM Comment 10/23/2014   KAPLAMBRATIO 12.17* 01/01/2015   Lab Results  Component Value Date   IRON 33* 09/08/2015   TIBC 172* 09/08/2015   FERRITIN 230 09/08/2015    PENDING LABS:   RADIOGRAPHIC STUDIES:  No results found.   PATHOLOGY:    ASSESSMENT AND PLAN:  Anemia in chronic kidney disease Anemia in the setting of  Stage III chronic renal disease followed by Keith Young with an element of iron deficiency anemia having received IV iron in the past and an element of anemia of chronic disease in the setting of an elevated ESR.   Work-up thus far has revealed diffuse osteopenia, anemia, chronic kidney disease and bone marrow biopsy showing 10% plasma cells but unfortunately monoclonality from his marrow is undeterminable. Cytogenetics were normal.  No M-spike in serum peripherally and no Bence Jones proteinuria on UPEP.  He is currently on ESA therapy consisting of Aranesp 50 mcg  every 2 weeks, beginning on 01/21/2015.  His last injection was 3 months ago with subsequent noncompliance/no-show:  Oncology Flowsheet 05/20/2015 06/03/2015  Darbepoetin Alfa (ARANESP) [place] 50 mcg 50 mcg    Labs today: CBC, ferritin  Labs every 2 weeks: CBC Labs every 90 days: Ferritin  On 08/31/2015, the patient had laboratory work performed by Keith Young that demonstrated a significant change in his hemoglobin, down to 8.2 g/dL, in addition to positive stool cards for blood. Keith Young is referred the patient to gastroenterology and he has a consultation appointment upcoming at the beginning of May 2017.  Repeat labs today demonstrate a stable hemoglobin 8.5 g/dL. I learned that due to the lapse since his last Aranesp injection, his authorization has expired.  We will work on getting this re-authorized.  Given his cardiac history, GI blood loss, inability to administer Aranesp today, and anemia, I will offer the patient 1 unit of packed red blood cells today to maintain a hemoglobin greater than 9.0 g/dL. The patient is agreeable to this. We will administer 1 unit of blood today in the clinic.  His anemia of chronic renal disease does not explain his persistent thrombocytopenia.  We may need to consider a repeat bone marrow aspiration and biopsy in the future.  He'll return in 2 weeks for follow-up appointment in further hematologic  recommendations.    THERAPY PLAN:  1 unit of packed red blood cells today. We will work on getting ESA support authorized.  May need to consider repeat BMBX in future. He has a appointment to be seen by gastroenterology in a few weeks for positive stool cards.  All questions were answered. The patient knows to call the clinic with any problems, questions or concerns. We can certainly see the patient much sooner if necessary.  Patient and plan discussed with Dr. Ancil Linsey and she is in agreement with the aforementioned.   This note is electronically signed by: Doy Mince 09/08/2015 5:09 PM

## 2015-09-08 ENCOUNTER — Other Ambulatory Visit (HOSPITAL_COMMUNITY): Payer: Medicare HMO

## 2015-09-08 ENCOUNTER — Encounter (HOSPITAL_BASED_OUTPATIENT_CLINIC_OR_DEPARTMENT_OTHER): Payer: Medicare HMO

## 2015-09-08 ENCOUNTER — Encounter (HOSPITAL_COMMUNITY): Payer: Medicare HMO | Attending: Hematology & Oncology | Admitting: Oncology

## 2015-09-08 VITALS — BP 112/70 | HR 58 | Temp 98.0°F | Resp 16

## 2015-09-08 VITALS — BP 99/51 | HR 73 | Temp 98.5°F | Resp 16 | Wt 122.0 lb

## 2015-09-08 DIAGNOSIS — D631 Anemia in chronic kidney disease: Secondary | ICD-10-CM | POA: Diagnosis not present

## 2015-09-08 DIAGNOSIS — N189 Chronic kidney disease, unspecified: Secondary | ICD-10-CM

## 2015-09-08 DIAGNOSIS — N183 Chronic kidney disease, stage 3 (moderate): Secondary | ICD-10-CM | POA: Diagnosis not present

## 2015-09-08 DIAGNOSIS — D509 Iron deficiency anemia, unspecified: Secondary | ICD-10-CM | POA: Diagnosis not present

## 2015-09-08 DIAGNOSIS — D649 Anemia, unspecified: Secondary | ICD-10-CM | POA: Insufficient documentation

## 2015-09-08 LAB — CBC
HCT: 25.6 % — ABNORMAL LOW (ref 39.0–52.0)
Hemoglobin: 8.5 g/dL — ABNORMAL LOW (ref 13.0–17.0)
MCH: 29.1 pg (ref 26.0–34.0)
MCHC: 33.2 g/dL (ref 30.0–36.0)
MCV: 87.7 fL (ref 78.0–100.0)
PLATELETS: 158 10*3/uL (ref 150–400)
RBC: 2.92 MIL/uL — ABNORMAL LOW (ref 4.22–5.81)
RDW: 12.9 % (ref 11.5–15.5)
WBC: 3.3 10*3/uL — AB (ref 4.0–10.5)

## 2015-09-08 LAB — ABO/RH: ABO/RH(D): B NEG

## 2015-09-08 LAB — FERRITIN: Ferritin: 230 ng/mL (ref 24–336)

## 2015-09-08 LAB — PREPARE RBC (CROSSMATCH)

## 2015-09-08 LAB — IRON AND TIBC
Iron: 33 ug/dL — ABNORMAL LOW (ref 45–182)
Saturation Ratios: 19 % (ref 17.9–39.5)
TIBC: 172 ug/dL — AB (ref 250–450)
UIBC: 139 ug/dL

## 2015-09-08 MED ORDER — ACETAMINOPHEN 325 MG PO TABS
650.0000 mg | ORAL_TABLET | Freq: Once | ORAL | Status: AC
  Administered 2015-09-08: 650 mg via ORAL

## 2015-09-08 MED ORDER — SODIUM CHLORIDE 0.9 % IV SOLN
250.0000 mL | Freq: Once | INTRAVENOUS | Status: AC
  Administered 2015-09-08: 250 mL via INTRAVENOUS

## 2015-09-08 MED ORDER — DIPHENHYDRAMINE HCL 25 MG PO CAPS
ORAL_CAPSULE | ORAL | Status: AC
  Filled 2015-09-08: qty 1

## 2015-09-08 MED ORDER — SODIUM CHLORIDE 0.9% FLUSH: 10.0000 mL | INTRAVENOUS | Status: DC | PRN

## 2015-09-08 MED ORDER — ACETAMINOPHEN 325 MG PO TABS
ORAL_TABLET | ORAL | Status: AC
  Filled 2015-09-08: qty 2

## 2015-09-08 MED ORDER — DIPHENHYDRAMINE HCL 25 MG PO TABS
25.0000 mg | ORAL_TABLET | Freq: Once | ORAL | Status: AC
  Administered 2015-09-08: 25 mg via ORAL

## 2015-09-08 NOTE — Patient Instructions (Signed)
[place] at [facility] Discharge Instructions  RECOMMENDATIONS MADE BY THE CONSULTANT AND ANY TEST RESULTS WILL BE SENT TO YOUR REFERRING PHYSICIAN.  One unit of blood today.    Thank you for choosing [place] at [facility] to provide your oncology and hematology care.  To afford each patient quality time with our provider, please arrive at least 15 minutes before your scheduled appointment time.   Beginning January 23rd 2017 lab work for the [facility] will be done in the  Main lab at [facility] on 1st floor. If you have a lab appointment with the [facility] please come in thru the  Main Entrance and check in at the main information desk  You need to re-schedule your appointment should you arrive 10 or more minutes late.  We strive to give you quality time with our providers, and arriving late affects you and other patients whose appointments are after yours.  Also, if you no show three or more times for appointments you may be dismissed from the clinic at the providers discretion.     Again, thank you for choosing [facility].  Our hope is that these requests will decrease the amount of time that you wait before being seen by our physicians.       _____________________________________________________________  Should you have questions after your visit to [facility], please contact our office at [phone] between the hours of 8:30 a.m. and 4:30 p.m.  Voicemails left after 4:30 p.m. will not be returned until the following business day.  For prescription refill requests, have your pharmacy contact our office.         Resources For Cancer Patients and their Caregivers ? American Cancer Society: Can assist with transportation, wigs, general needs, runs Look Good Feel Better.        ([phone] ? Cancer Care: Provides financial assistance, online support groups, medication/co-pay assistance.   1-800-813-HOPE 661-700-8886) ? [place] Assists [place] cancer patients and their families through emotional , educational and financial support.  402-274-6527 ? [place] DSS Where to apply for food stamps, Medicaid and utility assistance. 631 154 9790 ? RCATS: Transportation to medical appointments. ([phone] ? Social Security Administration: May apply for disability if have a Stage IV cancer. [phone] 355-3432 [phone] [phone] ? [employer], Disability and Transit Services: Assists with nutrition, care and transit needs. [phone]

## 2015-09-08 NOTE — Progress Notes (Signed)
Keith Young's reason for visit today is for labs as scheduled per MD orders and appt with provider.  Venipuncture performed with a 23 gauge butterfly needle to L Antecubital.  Keith Regal Tucker tolerated procedure well and without incident; questions were answered and patient was discharged.

## 2015-09-08 NOTE — Progress Notes (Signed)
Patient tolerated infusion well.  VSS.   

## 2015-09-08 NOTE — Patient Instructions (Signed)
[place] at [facility] Discharge Instructions  RECOMMENDATIONS MADE BY THE CONSULTANT AND ANY TEST RESULTS WILL BE SENT TO YOUR REFERRING PHYSICIAN.  Exam per T. Kefalas PA-C Hemoglobin 8.5  We will give you 1 unit of blood today We will get aranesp re authorized and restarted in the near future Return in 2 weeks  Thank you for choosing [place] at [facility] to provide your oncology and hematology care.  To afford each patient quality time with our provider, please arrive at least 15 minutes before your scheduled appointment time.   Beginning January 23rd 2017 lab work for the [facility] will be done in the  Main lab at [facility] on 1st floor. If you have a lab appointment with the [facility] please come in thru the  Main Entrance and check in at the main information desk  You need to re-schedule your appointment should you arrive 10 or more minutes late.  We strive to give you quality time with our providers, and arriving late affects you and other patients whose appointments are after yours.  Also, if you no show three or more times for appointments you may be dismissed from the clinic at the providers discretion.     Again, thank you for choosing [facility].  Our hope is that these requests will decrease the amount of time that you wait before being seen by our physicians.       _____________________________________________________________  Should you have questions after your visit to [facility], please contact our office at [phone] between the hours of 8:30 a.m. and 4:30 p.m.  Voicemails left after 4:30 p.m. will not be returned until the following business day.  For prescription refill requests, have your pharmacy contact our office.         Resources For Cancer Patients and their Caregivers ? American Cancer Society: Can assist with transportation, wigs, general needs, runs Look Good  Feel Better.        (249)567-7220 ? Cancer Care: Provides financial assistance, online support groups, medication/co-pay assistance.  1-800-813-HOPE 802-550-6913) ? [place] Assists [place] cancer patients and their families through emotional , educational and financial support.  (404)376-8567 ? [place] DSS Where to apply for food stamps, Medicaid and utility assistance. 408-426-7151 ? RCATS: Transportation to medical appointments. 864-702-2113 ? Social Security Administration: May apply for disability if have a Stage IV cancer. 540-859-1779 [phone] ? [employer], Disability and Transit Services: Assists with nutrition, care and transit needs. [phone]

## 2015-09-09 LAB — TYPE AND SCREEN
ABO/RH(D): B NEG
ANTIBODY SCREEN: NEGATIVE
Unit division: 0

## 2015-09-21 ENCOUNTER — Encounter: Payer: Self-pay | Admitting: Pulmonary Disease

## 2015-09-21 ENCOUNTER — Ambulatory Visit (HOSPITAL_COMMUNITY): Payer: Medicare HMO | Admitting: Hematology & Oncology

## 2015-09-21 ENCOUNTER — Other Ambulatory Visit (HOSPITAL_COMMUNITY): Payer: Medicare HMO

## 2015-09-21 ENCOUNTER — Ambulatory Visit (HOSPITAL_COMMUNITY): Payer: Medicare HMO

## 2015-09-22 ENCOUNTER — Encounter (HOSPITAL_COMMUNITY): Payer: Medicare HMO

## 2015-09-22 ENCOUNTER — Emergency Department (HOSPITAL_COMMUNITY): Payer: Medicare HMO

## 2015-09-22 ENCOUNTER — Encounter (HOSPITAL_COMMUNITY): Payer: Self-pay | Admitting: Hematology & Oncology

## 2015-09-22 ENCOUNTER — Encounter (HOSPITAL_BASED_OUTPATIENT_CLINIC_OR_DEPARTMENT_OTHER): Payer: Medicare HMO | Admitting: Hematology & Oncology

## 2015-09-22 ENCOUNTER — Encounter (HOSPITAL_COMMUNITY): Payer: Self-pay | Admitting: *Deleted

## 2015-09-22 ENCOUNTER — Encounter (HOSPITAL_BASED_OUTPATIENT_CLINIC_OR_DEPARTMENT_OTHER): Payer: Medicare HMO

## 2015-09-22 ENCOUNTER — Emergency Department (HOSPITAL_COMMUNITY)
Admission: EM | Admit: 2015-09-22 | Discharge: 2015-09-22 | Disposition: A | Discharge status: Home / Self Care | Payer: Medicare HMO | Attending: Emergency Medicine | Admitting: Emergency Medicine

## 2015-09-22 DIAGNOSIS — D631 Anemia in chronic kidney disease: Secondary | ICD-10-CM

## 2015-09-22 DIAGNOSIS — N189 Chronic kidney disease, unspecified: Secondary | ICD-10-CM | POA: Diagnosis not present

## 2015-09-22 DIAGNOSIS — M25461 Effusion, right knee: Secondary | ICD-10-CM | POA: Diagnosis not present

## 2015-09-22 DIAGNOSIS — D649 Anemia, unspecified: Secondary | ICD-10-CM | POA: Diagnosis not present

## 2015-09-22 DIAGNOSIS — Z7982 Long term (current) use of aspirin: Secondary | ICD-10-CM | POA: Insufficient documentation

## 2015-09-22 DIAGNOSIS — Z8673 Personal history of transient ischemic attack (TIA), and cerebral infarction without residual deficits: Secondary | ICD-10-CM | POA: Diagnosis not present

## 2015-09-22 DIAGNOSIS — Z87891 Personal history of nicotine dependence: Secondary | ICD-10-CM | POA: Insufficient documentation

## 2015-09-22 DIAGNOSIS — I130 Hypertensive heart and chronic kidney disease with heart failure and stage 1 through stage 4 chronic kidney disease, or unspecified chronic kidney disease: Secondary | ICD-10-CM | POA: Diagnosis not present

## 2015-09-22 DIAGNOSIS — N183 Chronic kidney disease, stage 3 (moderate): Secondary | ICD-10-CM | POA: Insufficient documentation

## 2015-09-22 DIAGNOSIS — F05 Delirium due to known physiological condition: Secondary | ICD-10-CM | POA: Diagnosis not present

## 2015-09-22 DIAGNOSIS — I429 Cardiomyopathy, unspecified: Secondary | ICD-10-CM | POA: Insufficient documentation

## 2015-09-22 DIAGNOSIS — I6350 Cerebral infarction due to unspecified occlusion or stenosis of unspecified cerebral artery: Secondary | ICD-10-CM | POA: Diagnosis not present

## 2015-09-22 DIAGNOSIS — J449 Chronic obstructive pulmonary disease, unspecified: Secondary | ICD-10-CM | POA: Diagnosis not present

## 2015-09-22 DIAGNOSIS — R4182 Altered mental status, unspecified: Secondary | ICD-10-CM

## 2015-09-22 DIAGNOSIS — E1122 Type 2 diabetes mellitus with diabetic chronic kidney disease: Secondary | ICD-10-CM | POA: Diagnosis not present

## 2015-09-22 DIAGNOSIS — I5022 Chronic systolic (congestive) heart failure: Secondary | ICD-10-CM | POA: Insufficient documentation

## 2015-09-22 DIAGNOSIS — E785 Hyperlipidemia, unspecified: Secondary | ICD-10-CM | POA: Insufficient documentation

## 2015-09-22 DIAGNOSIS — D72822 Plasmacytosis: Secondary | ICD-10-CM

## 2015-09-22 DIAGNOSIS — R350 Frequency of micturition: Secondary | ICD-10-CM

## 2015-09-22 LAB — COMPREHENSIVE METABOLIC PANEL
ALT: 28 U/L (ref 17–63)
AST: 39 U/L (ref 15–41)
Albumin: 2.8 g/dL — ABNORMAL LOW (ref 3.5–5.0)
Alkaline Phosphatase: 82 U/L (ref 38–126)
Anion gap: 6 (ref 5–15)
BUN: 43 mg/dL — AB (ref 6–20)
CO2: 24 mmol/L (ref 22–32)
Calcium: 9.3 mg/dL (ref 8.9–10.3)
Chloride: 104 mmol/L (ref 101–111)
Creatinine, Ser: 1.88 mg/dL — ABNORMAL HIGH (ref 0.61–1.24)
GFR, EST AFRICAN AMERICAN: 42 mL/min — AB (ref >60)
GFR, EST NON AFRICAN AMERICAN: 36 mL/min — AB (ref >60)
Glucose, Bld: 103 mg/dL — ABNORMAL HIGH (ref 65–99)
POTASSIUM: 4.4 mmol/L (ref 3.5–5.1)
Sodium: 134 mmol/L — ABNORMAL LOW (ref 135–145)
TOTAL PROTEIN: 8.2 g/dL — AB (ref 6.5–8.1)
Total Bilirubin: 0.4 mg/dL (ref 0.3–1.2)

## 2015-09-22 LAB — CBC
HCT: 28.7 % — ABNORMAL LOW (ref 39.0–52.0)
Hemoglobin: 9.3 g/dL — ABNORMAL LOW (ref 13.0–17.0)
MCH: 28.1 pg (ref 26.0–34.0)
MCHC: 32.4 g/dL (ref 30.0–36.0)
MCV: 86.7 fL (ref 78.0–100.0)
PLATELETS: 145 10*3/uL — AB (ref 150–400)
RBC: 3.31 MIL/uL — ABNORMAL LOW (ref 4.22–5.81)
RDW: 13 % (ref 11.5–15.5)
WBC: 3.7 10*3/uL — AB (ref 4.0–10.5)

## 2015-09-22 LAB — URINALYSIS, ROUTINE W REFLEX MICROSCOPIC
BILIRUBIN URINE: NEGATIVE
Glucose, UA: NEGATIVE mg/dL
Hgb urine dipstick: NEGATIVE
KETONES UR: NEGATIVE mg/dL
LEUKOCYTES UA: NEGATIVE
NITRITE: NEGATIVE
PROTEIN: 100 mg/dL — AB
Specific Gravity, Urine: 1.02 (ref 1.005–1.030)
pH: 5.5 (ref 5.0–8.0)

## 2015-09-22 LAB — URINE MICROSCOPIC-ADD ON: BACTERIA UA: NONE SEEN

## 2015-09-22 LAB — GRAM STAIN

## 2015-09-22 LAB — CBG MONITORING, ED: GLUCOSE-CAPILLARY: 80 mg/dL (ref 65–99)

## 2015-09-22 LAB — SYNOVIAL CELL COUNT + DIFF, W/ CRYSTALS
Eosinophils-Synovial: 0 % (ref 0–1)
LYMPHOCYTES-SYNOVIAL FLD: 34 % — AB (ref 0–20)
MONOCYTE-MACROPHAGE-SYNOVIAL FLUID: 6 % — AB (ref 50–90)
Neutrophil, Synovial: 60 % — ABNORMAL HIGH (ref 0–25)
OTHER CELLS-SYN: 0
WBC, Synovial: 4335 /mm3 — ABNORMAL HIGH (ref 0–200)

## 2015-09-22 LAB — DIGOXIN LEVEL: DIGOXIN LVL: 0.8 ng/mL (ref 0.8–2.0)

## 2015-09-22 LAB — AMMONIA

## 2015-09-22 MED ORDER — LIDOCAINE-EPINEPHRINE (PF) 1 %-1:200000 IJ SOLN
10.0000 mL | Freq: Once | INTRAMUSCULAR | Status: AC
  Administered 2015-09-22: 10 mL via INTRADERMAL
  Filled 2015-09-22: qty 30

## 2015-09-22 MED ORDER — DARBEPOETIN ALFA 60 MCG/0.3ML IJ SOSY
50.0000 ug | PREFILLED_SYRINGE | Freq: Once | INTRAMUSCULAR | Status: AC
  Administered 2015-09-22: 50 ug via SUBCUTANEOUS
  Filled 2015-09-22: qty 0.3

## 2015-09-22 MED ORDER — SODIUM CHLORIDE 0.9 % IV BOLUS (SEPSIS)
500.0000 mL | Freq: Once | INTRAVENOUS | Status: AC
  Administered 2015-09-22: 500 mL via INTRAVENOUS

## 2015-09-22 MED ORDER — LIDOCAINE-EPINEPHRINE (PF) 1 %-1:200000 IJ SOLN
INTRAMUSCULAR | Status: DC
Start: 2015-09-22 — End: 2015-09-22
  Filled 2015-09-22: qty 30

## 2015-09-22 NOTE — ED Notes (Signed)
Caregiver states pt has been "off" for three weeks. States that patient has been unable to complete simple tasks sometimes. States that he was attempting to shower yesterday and it took him 3.5 hours to complete it and is unsure if he actually completed the shower. Patient is alert, but has delayed responses. Oriented to person/place/time/situation.

## 2015-09-22 NOTE — Discharge Instructions (Signed)
If you were given medicines take as directed.  If you are on coumadin or contraceptives realize their levels and effectiveness is altered by many different medicines.  If you have any reaction (rash, tongues swelling, other) to the medicines stop taking and see a physician.   Follow up for joint culture results. If your blood pressure was elevated in the ER make sure you follow up for management with a primary doctor or return for chest pain, shortness of breath or stroke symptoms.  Please follow up as directed and return to the ER or see a physician for new or worsening symptoms.  Thank you. Filed Vitals:   09/22/15 1111 09/22/15 1230 09/22/15 1310  BP: 109/63  172/89  Pulse: 63 58 62  Temp: 98.5 F (36.9 C)    TempSrc: Oral    Resp: 16 14 13   Height: 5\' 3"  (1.6 m)    Weight: 120 lb (54.432 kg)    SpO2: 100% 99% 100%

## 2015-09-22 NOTE — ED Notes (Signed)
Patient with no complaints at this time. Respirations even and unlabored. Skin warm/dry. Discharge instructions reviewed with patient at this time. Patient given opportunity to voice concerns/ask questions. IV removed per policy and band-aid applied to site. Patient discharged at this time and left Emergency Department via wheelchair.  

## 2015-09-22 NOTE — Patient Instructions (Addendum)
[place] at [facility] Discharge Instructions  RECOMMENDATIONS MADE BY THE CONSULTANT AND ANY TEST RESULTS WILL BE SENT TO YOUR REFERRING PHYSICIAN.   Exam and discussion by Dr Whitney Muse today Urinalysis today, we will call you with the results Return to see the doctor in 1 month Arnesp every 2 weeks with labs prior Please call the clinic if you have any questions or concerns     Thank you for choosing [place] at [facility] to provide your oncology and hematology care.  To afford each patient quality time with our provider, please arrive at least 15 minutes before your scheduled appointment time.   Beginning January 23rd 2017 lab work for the [facility] will be done in the  Main lab at [facility] on 1st floor. If you have a lab appointment with the [facility] please come in thru the  Main Entrance and check in at the main information desk  You need to re-schedule your appointment should you arrive 10 or more minutes late.  We strive to give you quality time with our providers, and arriving late affects you and other patients whose appointments are after yours.  Also, if you no show three or more times for appointments you may be dismissed from the clinic at the providers discretion.     Again, thank you for choosing [facility].  Our hope is that these requests will decrease the amount of time that you wait before being seen by our physicians.       _____________________________________________________________  Should you have questions after your visit to [facility], please contact our office at [phone] between the hours of 8:30 a.m. and 4:30 p.m.  Voicemails left after 4:30 p.m. will not be returned until the following business day.  For prescription refill requests, have your pharmacy contact our office.         Resources For Cancer Patients and their Caregivers ? American Cancer  Society: Can assist with transportation, wigs, general needs, runs Look Good Feel Better.        571-719-1293 ? Cancer Care: Provides financial assistance, online support groups, medication/co-pay assistance.  1-800-813-HOPE (641)521-4026) ? [place] Assists [place] cancer patients and their families through emotional , educational and financial support.  580-769-6651 ? [place] DSS Where to apply for food stamps, Medicaid and utility assistance. 548-428-8202 ? RCATS: Transportation to medical appointments. 941-088-0028 ? Social Security Administration: May apply for disability if have a Stage IV cancer. 475-422-7911 952-679-4764 ? [employer], Disability and Transit Services: Assists with nutrition, care and transit needs. [phone]

## 2015-09-22 NOTE — Progress Notes (Signed)
Hemoglobin 9.3. Keith Young presents today for injection per MD orders. Aranesp 50 mcg administered SQ in right Abdomen. Administration without incident. Patient tolerated well.

## 2015-09-22 NOTE — ED Provider Notes (Signed)
Apiration of blood/fluid Performed by: Hinton Lovely Consent obtained. Required items: no special items required Patient identity confirmed: verbally with patient Time out: Immediately prior to procedure a "time out" was called to verify the correct patient, procedure, equipment, support staff and site/side marked as required. Preparation: Patient was prepped and draped in the usual sterile fashion. Skin was prepared with betadine swabs x3. Patient tolerance: Patient tolerated the procedure well with no immediate complications. Location of aspiration: Right knee    Right knee was prepared and local anaesthesia achieved with approximately 2 mLs 1% xylocaine with epinephrine along aspiration tract from superolateral approach and in joint space. Joint was aspirated with approximately 20 ccs of dark yellow translucent fluid obtained. Sample sent for laboratory studies. No significant bleeding during or after procedure.  Collier Salina, MD 09/22/2015, 1:11 PM   Collier Salina, MD 09/22/15 1311  Elnora Morrison, MD 09/22/15 [phone]

## 2015-09-22 NOTE — Patient Instructions (Signed)
[place] at [facility] Discharge Instructions  RECOMMENDATIONS MADE BY THE CONSULTANT AND ANY TEST RESULTS WILL BE SENT TO YOUR REFERRING PHYSICIAN.  Hemoglobin 9.3. Aranesp 50 mcg injection given as ordered.  Thank you for choosing [place] at [facility] to provide your oncology and hematology care.  To afford each patient quality time with our provider, please arrive at least 15 minutes before your scheduled appointment time.   Beginning January 23rd 2017 lab work for the [facility] will be done in the  Main lab at [facility] on 1st floor. If you have a lab appointment with the [facility] please come in thru the  Main Entrance and check in at the main information desk  You need to re-schedule your appointment should you arrive 10 or more minutes late.  We strive to give you quality time with our providers, and arriving late affects you and other patients whose appointments are after yours.  Also, if you no show three or more times for appointments you may be dismissed from the clinic at the providers discretion.     Again, thank you for choosing [facility].  Our hope is that these requests will decrease the amount of time that you wait before being seen by our physicians.       _____________________________________________________________  Should you have questions after your visit to [facility], please contact our office at [phone] between the hours of 8:30 a.m. and 4:30 p.m.  Voicemails left after 4:30 p.m. will not be returned until the following business day.  For prescription refill requests, have your pharmacy contact our office.         Resources For Cancer Patients and their Caregivers ? American Cancer Society: Can assist with transportation, wigs, general needs, runs Look Good Feel Better.        705-591-4638 ? Cancer Care: Provides financial assistance, online support groups,  medication/co-pay assistance.  1-800-813-HOPE (571)436-3709) ? [place] Assists [place] cancer patients and their families through emotional , educational and financial support.  616-357-3480 ? [place] DSS Where to apply for food stamps, Medicaid and utility assistance. 4133681158 ? [facility]: Transportation to medical appointments. (386)017-9186 ? Social Security Administration: May apply for disability if have a Stage IV cancer. 609 018 1470 (617)182-0015 ? [employer], Disability and Transit Services: Assists with nutrition, care and transit needs. (571)865-7156

## 2015-09-22 NOTE — ED Notes (Signed)
Pt comes in from adult care facility, [facility]. Pt was at the cancer center and was sent down here for evaluation because of altered mental state. Pts caregiver states this has been ongoing for around 3 weeks. Pt is alert and oriented x4 in triage. NAD noted. Pt's only complain of pain is in his right knee.

## 2015-09-22 NOTE — ED Provider Notes (Signed)
CSN: [id number]     Arrival date & time 09/22/15  1053 History  By signing my name below, I, Eustaquio Maize, attest that this documentation has been prepared under the direction and in the presence of Elnora Morrison, MD. Electronically Signed: Eustaquio Maize, ED Scribe. 09/22/2015. 11:51 AM.   Chief Complaint  Patient presents with  . Altered Mental Status   The history is provided by the patient. No language interpreter was used.     HPI Comments: Keith Young is a 65 y.o. male with PMHx chronic systolic heart failure, DM, CKD, HLD, essential hypertension, COPD, and CVA who presents to the Emergency Department complaining of AMS for the past 3 weeks. Pt is from an adult care facility and was at the cancer center today for anemia and was sent down to the ED for AMS. Caregivers have been aware of AMS. Pt does not believe he is having issues with his mental status. Pt is alert and oriented to person, place, and time. Denies headache, visual changes, neck pain, sore throat, chest pain, shortness of breath, abdominal pain, vomiting, weakness, confusion, or any other associated symptoms. No hx liver disease.   Past Medical History  Diagnosis Date  . Chronic systolic heart failure (HCC)     NYHA Class III  . Type 2 diabetes mellitus ([place])   . Nonischemic cardiomyopathy (HCC)     EF 15-20%  . Essential hypertension, benign   . CKD (chronic kidney disease) stage 3, GFR 30-59 ml/min   . Anemia   . Hyperlipidemia   . History of stroke   . Bell's palsy   . Cardiomyopathy ([place])   . Aortic insufficiency   . Alcoholism in remission ([place])   . Noncompliance with medication regimen   . NSVT (nonsustained ventricular tachycardia) ([place])   . Cardiac defibrillator in situ   . COPD (chronic obstructive pulmonary disease) ([place])   . CVA (cerebral infarction) 01/2013  . Productive cough 08/2013    With brown sputum   . SOB (shortness of breath) on exertion 08/2013  . Numbness of right jaw     Had a stoke  in 01/2013. Numbness is occasional, especially when trying to chew.  . At moderate risk for fall   . AICD (automatic cardioverter/defibrillator) present   . Stroke [facility])     weakness of right side from CVA  . Arthritis    Past Surgical History  Procedure Laterality Date  . Cardiac defibrillator placement  11/12/12    [employer] Inogen MINI ICD implanted in [place] at [facility]  . Cardiac surgery      bioprostetic AVR per Dr Nelly Laurence note  . Colonoscopy with propofol N/A 04/10/2014    RMR: Colonic Diverticulosis  . Esophagogastroduodenoscopy (egd) with propofol N/A 04/10/2014    RMR: Mild erosive reflux esophagitis. Multiple antral polyps likely hyperplastic status post removal by hot snare cautery technique. Diffusely abnormal stomach status post gastric biopsy I suspect some of patients anemaia may be due to intermittent oozing from the stomach. It would be difficult and a risky proposition to attempt complete removal of all of his gastric polyps.  . Polypectomy N/A 04/10/2014    Procedure: GASTRIC POLYPECTOMY;  Surgeon: Daneil Dolin, MD;  Location: [facility] ORS;  Service: Endoscopy;  Laterality: N/A;  . Biopsy N/A 04/10/2014    Procedure: GASTRIC BIOPSY;  Surgeon: Daneil Dolin, MD;  Location: [facility] OR[facility];  Service: Endoscopy;  Laterality: N/A;  . Right heart catheterization N/A 02/06/2014  Procedure: RIGHT HEART CATH;  Surgeon: Larey Dresser, MD;  Location: [facility] CATH LAB;  Service: Cardiovascular;  Laterality: N/A;   Family History  Problem Relation Age of Onset  . Kidney disease Mother   . Kidney disease Sister   . Colon cancer Neg Hx    Social History  Substance Use Topics  . Smoking status: Former Smoker    Types: Cigarettes    Quit date: 11/05/1993  . Smokeless tobacco: Never Used  . Alcohol Use: Yes     Comment: former heavy ETOH, none recently    Review of Systems  HENT: Negative for sore throat.   Eyes: Negative for visual disturbance.  Respiratory:  Negative for shortness of breath.   Cardiovascular: Negative for chest pain.  Gastrointestinal: Negative for vomiting and abdominal pain.  Musculoskeletal: Negative for neck pain.  Neurological: Negative for weakness and headaches.  Psychiatric/Behavioral: Negative for confusion.  All other systems reviewed and are negative.   Allergies  Review of patient's allergies indicates no known allergies.  Home Medications   Prior to Admission medications   Medication Sig Start Date End Date Taking? Authorizing Provider  acyclovir (ZOVIRAX) 800 MG tablet Take 800 mg by mouth 5 (five) times daily.  04/21/15   Historical Provider, MD  aspirin 81 MG EC tablet Take 1 tablet (81 mg total) by mouth daily. 12/17/13   Larey Dresser, MD  atorvastatin (LIPITOR) 80 MG tablet Take 80 mg by mouth daily.    Historical Provider, MD  carvedilol (COREG) 25 MG tablet Take 12.5 mg by mouth 2 (two) times daily with a meal.     Historical Provider, MD  Cholecalciferol (VITAMIN D3) 2000 UNITS TABS Take 2,000 mg by mouth daily.    Historical Provider, MD  digoxin (LANOXIN) 0.125 MG tablet TAKE 1/2 TABLET BY MOUTH DAILY 06/22/15   Jolaine Artist, MD  ferrous sulfate 325 (65 FE) MG tablet Take 325 mg by mouth daily with breakfast.    Historical Provider, MD  hydrALAZINE (APRESOLINE) 25 MG tablet Take 3 tablets by mouth 3 (three) times daily. 03/10/14   Historical Provider, MD  insulin lispro (HUMALOG) 100 UNIT/ML injection Inject 2-6 Units into the skin 3 (three) times daily with meals.    Historical Provider, MD  isosorbide dinitrate (ISORDIL) 20 MG tablet Take 2 tablets by mouth 3 (three) times daily. 03/10/14   Historical Provider, MD  pantoprazole (PROTONIX) 40 MG tablet Take 1 tablet (40 mg total) by mouth daily. 07/14/15   Mahala Menghini, PA-C  traMADol (ULTRAM) 50 MG tablet Take 50 mg by mouth every 6 (six) hours as needed.  08/05/14   Historical Provider, MD   BP 172/89 mmHg  Pulse 62  Temp(Src) 98.5 F (36.9  C) (Oral)  Resp 13  Ht 5\' 3"  (1.6 m)  Wt 120 lb (54.432 kg)  BMI 21.26 kg/m2  SpO2 100%   Physical Exam  Constitutional: He is oriented to person, place, and time. He appears well-developed and well-nourished. No distress.  HENT:  Head: Normocephalic and atraumatic.  Mouth/Throat: Mucous membranes are dry.  DMM  Eyes: Conjunctivae and EOM are normal. Pupils are equal, round, and reactive to light.  Visual fields grossly intact to fingers.  Neck: Neck supple. No tracheal deviation present.  No meningismus.   Cardiovascular: Normal rate.   Pulmonary/Chest: Effort normal. No respiratory distress.  Abdominal: Soft. There is no tenderness.  Musculoskeletal: Normal range of motion.  Equal strength in lower extremities.  Mod joint effusion  on the right knee. Mild warmth. No rash.   Neurological: He is alert and oriented to person, place, and time.  No facial droop.  No obvious arm drift. Equal strength in the arms.   Skin: Skin is warm and dry.  Psychiatric: He has a normal mood and affect. His behavior is normal.  Nursing note and vitals reviewed.   ED Course  Procedures (including critical care time) See resident note DIAGNOSTIC STUDIES: Oxygen Saturation is 100% on RA, normal by my interpretation.    COORDINATION OF CARE: 11:50 AM-Discussed treatment plan with pt at bedside and pt agreed to plan.   Labs Review Labs Reviewed  COMPREHENSIVE METABOLIC PANEL - Abnormal; Notable for the following:    Sodium 134 (*)    Glucose, Bld 103 (*)    BUN 43 (*)    Creatinine, Ser 1.88 (*)    Total Protein 8.2 (*)    Albumin 2.8 (*)    GFR calc non Af Amer 36 (*)    GFR calc Af Amer 42 (*)    All other components within normal limits  AMMONIA - Abnormal; Notable for the following:    Ammonia <9 (*)    All other components within normal limits  SYNOVIAL CELL COUNT + DIFF, W/ CRYSTALS - Abnormal; Notable for the following:    Appearance-Synovial CLOUDY (*)    WBC, Synovial 4335  (*)    All other components within normal limits  GRAM STAIN  CULTURE, BODY FLUID-BOTTLE  DIGOXIN LEVEL  URINALYSIS, ROUTINE W REFLEX MICROSCOPIC (NOT AT [facility])  CBG MONITORING, ED    Imaging Review Ct Head Wo Contrast  09/22/2015  CLINICAL DATA:  Altered mental status for several weeks EXAM: CT HEAD WITHOUT CONTRAST TECHNIQUE: Contiguous axial images were obtained from the base of the skull through the vertex without intravenous contrast. COMPARISON:  04/28/2015 FINDINGS: The bony calvarium is intact. No gross soft tissue abnormality is noted. Atrophic changes and chronic white matter ischemic changes are seen and stable. Changes of prior infarct are noted in the right posterior parietal region. Small lacunar infarcts are noted within the thalami, right pons and basal ganglia. No findings to suggest acute hemorrhage, acute infarction or space-occupying mass lesion is seen. IMPRESSION: Chronic changes without acute abnormality. Electronically Signed   By: Inez Catalina M.D.   On: 09/22/2015 13:43   I have personally reviewed and evaluated these images and lab results as part of my medical decision-making.   EKG Interpretation None      MDM   Final diagnoses:  Altered mental status, unspecified altered mental status type  CRF (chronic renal failure), unspecified stage  Joint effusion, knee, right   Patient presents with gradually worsening ability to function independently the past 3 weeks. Patient's alert and oriented 3 in the ER. Mild slowing answering all questions however appropriate. Vital signs unremarkable for mild elevated blood pressure. Patient has close follow-up outpatient. Plan for general altered mental workup, CT scan of the head unremarkable. No focal neuro deficits in the ER. Patient did have knee joint effusion on the right, no fever or clinical signs of significant infection however this is new per patient. Joint aspiration performed after permission  for culture and cell  counts.  I was present and assisted with joint aspiration done by resident physician.  On reassessment patient doing well, at baseline for the past 2-3 weeks per family member. Patient has outpatient follow-up. Patient had proximal May 4000 white blood cells, concerning for gout and  nonseptic cause. Patient is dry clinically he does have cardiomyopathy, small fluid bolus ordered. Plan for continued outpatient follow-up  Results and differential diagnosis were discussed with the patient/parent/guardian. Xrays were independently reviewed by myself.  Close follow up outpatient was discussed, comfortable with the plan.   Medications  lidocaine-EPINEPHrine (XYLOCAINE-EPINEPHrine) 1 %-1:200000 (PF) injection (  Not Given 09/22/15 1243)  lidocaine-EPINEPHrine (XYLOCAINE-EPINEPHrine) 1 %-1:200000 (PF) injection 10 mL (10 mLs Intradermal Given 09/22/15 1243)  sodium chloride 0.9 % bolus 500 mL (500 mLs Intravenous New Bag/Given 09/22/15 1425)    Filed Vitals:   09/22/15 1111 09/22/15 1230 09/22/15 1310  BP: 109/63  172/89  Pulse: 63 58 62  Temp: 98.5 F (36.9 C)    TempSrc: Oral    Resp: 16 14 13   Height: 5\' 3"  (1.6 m)    Weight: 120 lb (54.432 kg)    SpO2: 100% 99% 100%    Final diagnoses:  Altered mental status, unspecified altered mental status type  CRF (chronic renal failure), unspecified stage  Joint effusion, knee, right       Elnora Morrison, MD 09/22/15 1450

## 2015-09-22 NOTE — Progress Notes (Signed)
[facility] NOTE  Patient Care Team: Sinda Du, MD as PCP - General (Pulmonary Disease) Daneil Dolin, MD as Consulting Physician (Gastroenterology)  CHIEF COMPLAINTS/PURPOSE OF CONSULTATION:  Iron deficiency anemia Chronic kidney disease, Stage III History of IV iron replacement History of cocaine abuse Cardiac defibrillator placement, 11/12/2012 Colonoscopy & EGD 04/10/2014 with Dr. Sydell Axon Diverticulosis and diffusely abnormal stomach and mild erosive esophagitis 04/10/2014 Biopsy showing chronic gastritis 04/10/2014 Elevated ESR at 81 mm/hr  HISTORY OF PRESENTING ILLNESS:  Keith Young 65 y.o. male is here because of anemia. He has been referred by Dr. Theador Hawthorne as his anemia is felt to be out of proportion to his CKD.  He lives at [facility]. He moved there because "it was getting hard to walk" and "hard to do stuff for himself." There are 5 residents there.  His EGD report was reviewed from 03/2014, Dr.Rourke felt is was possible that the patient may have intermittent "oozing from his stomach" that was contributing to his anemia.   He was receiving aranesp with excellent improvement in his counts: Results for VICTORIANO, CAMPION (MRN [id number]) as of 09/23/2015 08:17  Ref. Range 03/25/2015 13:10 04/08/2015 11:09 04/28/2015 16:28 04/29/2015 04:01 05/20/2015 12:30 06/03/2015 12:24 09/08/2015 09:53 09/22/2015 08:27  Hemoglobin Latest Ref Range: 13.0-17.0 g/dL 10.9 (L) 11.1 (L) 11.7 (L) 11.7 (L) 10.9 (L) 11.0 (L) 8.5 (L) 9.3 (L)   Oncology Flowsheet 01/21/2015 02/10/2015 02/25/2015 03/25/2015  Darbepoetin Alfa (ARANESP) [place] 50 mcg 50 mcg 50 mcg 50 mcg   Oncology Flowsheet 04/28/2015 05/20/2015 06/03/2015 09/22/2015  Darbepoetin Alfa (ARANESP) [place]  50 mcg 50 mcg 50 mcg   Unfortunately he was lost to follow-up in January with no additional aranesp injections scheduled.  He returned to the clinic and saw Tom. Hemoglobin was noted to have fallen  significantly. Keith Young returns to the [facility] today accompanied by his caregiver.  His caregiver comments that he can't make it to the bathroom in time at night. The caregiver also notes that Keith Young often gets disoriented these days, such as staying in the shower for 3 hours yesterday. He notes, "sometimes he confused where he is, staring into space." He reports this is all new behavior over the past several weeks. He denies fever, cough, SOB. Reports only increased urination, confusion and alteration in baseline behavior.    MEDICAL HISTORY:  Past Medical History  Diagnosis Date  . Chronic systolic heart failure (HCC)     NYHA Class III  . Type 2 diabetes mellitus ([place])   . Nonischemic cardiomyopathy (HCC)     EF 15-20%  . Essential hypertension, benign   . CKD (chronic kidney disease) stage 3, GFR 30-59 ml/min   . Anemia   . Hyperlipidemia   . History of stroke   . Bell's palsy   . Cardiomyopathy ([place])   . Aortic insufficiency   . Alcoholism in remission ([place])   . Noncompliance with medication regimen   . NSVT (nonsustained ventricular tachycardia) ([place])   . Cardiac defibrillator in situ   . COPD (chronic obstructive pulmonary disease) ([place])   . CVA (cerebral infarction) 01/2013  . Productive cough 08/2013    With brown sputum   . SOB (shortness of breath) on exertion 08/2013  . Numbness of right jaw     Had a stoke in 01/2013. Numbness is occasional, especially when trying to chew.  . At moderate risk for fall   . AICD (automatic cardioverter/defibrillator)  present   . Stroke [facility])     weakness of right side from CVA  . Arthritis     SURGICAL HISTORY: Past Surgical History  Procedure Laterality Date  . Cardiac defibrillator placement  11/12/12    [employer] Inogen MINI ICD implanted in [place] at [facility]  . Cardiac surgery      bioprostetic AVR per Dr Nelly Laurence note  . Colonoscopy with propofol N/A 04/10/2014    RMR: Colonic  Diverticulosis  . Esophagogastroduodenoscopy (egd) with propofol N/A 04/10/2014    RMR: Mild erosive reflux esophagitis. Multiple antral polyps likely hyperplastic status post removal by hot snare cautery technique. Diffusely abnormal stomach status post gastric biopsy I suspect some of patients anemaia may be due to intermittent oozing from the stomach. It would be difficult and a risky proposition to attempt complete removal of all of his gastric polyps.  . Polypectomy N/A 04/10/2014    Procedure: GASTRIC POLYPECTOMY;  Surgeon: Daneil Dolin, MD;  Location: [facility];  Service: Endoscopy;  Laterality: N/A;  . Biopsy N/A 04/10/2014    Procedure: GASTRIC BIOPSY;  Surgeon: Daneil Dolin, MD;  Location: [facility];  Service: Endoscopy;  Laterality: N/A;  . Right heart catheterization N/A 02/06/2014    Procedure: RIGHT HEART CATH;  Surgeon: Larey Dresser, MD;  Location: [facility] CATH LAB;  Service: Cardiovascular;  Laterality: N/A;    SOCIAL HISTORY: Social History   Social History  . Marital Status: Single    Spouse Name: N/A  . Number of Children: N/A  . Years of Education: N/A   Occupational History  . Not on file.   Social History Main Topics  . Smoking status: Former Smoker    Types: Cigarettes    Quit date: 11/05/1993  . Smokeless tobacco: Never Used  . Alcohol Use: Yes     Comment: former heavy ETOH, none recently  . Drug Use: No     Comment: prior history of cocaine use, smoking   . Sexual Activity: Yes    Birth Control/ Protection: None   Other Topics Concern  . Not on file   Social History Narrative   Recently moved from [place] to be with family in [place]              Previous smoker, he smoked for about a year. He drank alcohol but had no problems with it. He worked at a [employer] for 13 years. His hobbies include softball and bowling.  FAMILY HISTORY: Family History  Problem Relation Age of Onset  . Kidney disease Mother   . Kidney disease Sister   . Colon  cancer Neg Hx    has no family status information on file.   Had 7 brothers, 8 sisters. He is the youngest. 58 or 43 still living. All the same mother. 1 sister lost to kidney problems 2 sisters lost to breast cancer. Another sister lost to cancer. Mother deceased, from cancer. He is unsure what age or what type of cancer. Brother in law says she was 73 yo when she died and had diverticulitis, was on dialysis, major stomach issue, double mastectomy. Father deceased, he died when he was little so he is unsure when or what he died from.  ALLERGIES:  has No Known Allergies.  MEDICATIONS:  Current Outpatient Prescriptions  Medication Sig Dispense Refill  . acyclovir (ZOVIRAX) 800 MG tablet Take 800 mg by mouth 5 (five) times daily.     Marland Kitchen aspirin 81 MG EC tablet Take 1  tablet (81 mg total) by mouth daily.    Marland Kitchen atorvastatin (LIPITOR) 80 MG tablet Take 80 mg by mouth daily.    . carvedilol (COREG) 25 MG tablet Take 12.5 mg by mouth 2 (two) times daily with a meal.     . Cholecalciferol (VITAMIN D3) 2000 UNITS TABS Take 2,000 mg by mouth daily.    . digoxin (LANOXIN) 0.125 MG tablet TAKE 1/2 TABLET BY MOUTH DAILY 15 tablet 3  . ferrous sulfate 325 (65 FE) MG tablet Take 325 mg by mouth daily with breakfast.    . hydrALAZINE (APRESOLINE) 25 MG tablet Take 3 tablets by mouth 3 (three) times daily.    . insulin lispro (HUMALOG) 100 UNIT/ML injection Inject 2-6 Units into the skin 3 (three) times daily with meals.    . isosorbide dinitrate (ISORDIL) 20 MG tablet Take 2 tablets by mouth 3 (three) times daily.    . pantoprazole (PROTONIX) 40 MG tablet Take 1 tablet (40 mg total) by mouth daily. 30 tablet 11  . traMADol (ULTRAM) 50 MG tablet Take 50 mg by mouth every 6 (six) hours as needed.      No current facility-administered medications for this visit.    Review of Systems  Constitutional: Positive for malaise/fatigue. Negative for fever, chills, weight loss and diaphoresis.  HENT: Positive  for hearing loss. Negative for congestion, ear pain, nosebleeds and tinnitus.        Has a hearing aid.  Respiratory: Negative.   Cardiovascular: Negative for chest pain.  Gastrointestinal: Negative for heartburn, nausea, vomiting, diarrhea and constipation.  Genitourinary: Negative for dysuria.       Nocturia, used to be 3 times a night but now only 2 times.  Musculoskeletal: Positive for joint pain.       Right arm pain.  Skin: Negative.   Neurological: Negative.  Negative for seizures, weakness and headaches.  Endo/Heme/Allergies: Negative.   Psychiatric/Behavioral: Negative for depression.   14 point ROS was done and is otherwise as detailed above or in HPI    PHYSICAL EXAMINATION:  ECOG PERFORMANCE STATUS: 1 - Symptomatic but completely ambulatory Vitals reviewed and stable. No fever. Physical Exam  Constitutional:confused. (patient sent to bathroom to obtain U/A and confused on how to do it) HENT:  Head: Normocephalic and atraumatic.  Nose: Nose normal.  Mouth/Throat: Oropharynx is clear and moist. No oropharyngeal exudate.  Edentulous.   Eyes: Conjunctivae and EOM are normal. Pupils are equal, round, and reactive to light. Right eye exhibits no discharge. Left eye exhibits no discharge. No scleral icterus.  Neck: Normal range of motion. Neck supple. No tracheal deviation present. No thyromegaly present.  Cardiovascular: Normal rate and regular rhythm.  Exam reveals no gallop and no friction rub.   Murmur heard. Systolic ejection murmur. Defibrillator present.  Pulmonary/Chest: Effort normal and breath sounds normal. He has no wheezes. He has no rales.  Occasional course rhonchi.  Abdominal: Soft. Bowel sounds are normal. He exhibits no distension and no mass. There is no tenderness. There is no rebound and no guarding.  Musculoskeletal: Normal range of motion. He exhibits no edema.  Lymphadenopathy:    He has no cervical adenopathy.  Neurological: He is alert and  oriented to person. He has normal reflexes. No cranial nerve deficit. Gait normal. Coordination normal.  Skin: Skin is warm and dry. No rash noted.  Marland Kitchen  Psychiatric: Mood, memory, affect and judgment normal.  Nursing note and vitals reviewed.   LABORATORY DATA:  I have reviewed the  data as listed  Results for Keith, Young (MRN [id number]) as of 09/23/2015 08:17  Ref. Range 09/22/2015 08:27 09/22/2015 11:28  Sodium Latest Ref Range: 135-145 mmol/L  134 (L)  Potassium Latest Ref Range: 3.5-5.1 mmol/L  4.4  Chloride Latest Ref Range: 101-111 mmol/L  104  CO2 Latest Ref Range: 22-32 mmol/L  24  BUN Latest Ref Range: 6-20 mg/dL  43 (H)  Creatinine Latest Ref Range: 0.61-1.24 mg/dL  1.88 (H)  Calcium Latest Ref Range: 8.9-10.3 mg/dL  9.3  EGFR (Non-African Amer.) Latest Ref Range: >60 mL/min  36 (L)  EGFR (African American) Latest Ref Range: >60 mL/min  42 (L)  Glucose Latest Ref Range: 65-99 mg/dL  103 (H)  Anion gap Latest Ref Range: 5-15   6  Alkaline Phosphatase Latest Ref Range: 38-126 U/L  82  Albumin Latest Ref Range: 3.5-5.0 g/dL  2.8 (L)  AST Latest Ref Range: 15-41 U/L  39  ALT Latest Ref Range: 17-63 U/L  28  Total Protein Latest Ref Range: 6.5-8.1 g/dL  8.2 (H)  Ammonia Latest Ref Range: 9-35 umol/L  <9 (L)  Total Bilirubin Latest Ref Range: 0.3-1.2 mg/dL  0.4  WBC Latest Ref Range: 4.0-10.5 K/uL 3.7 (L)   RBC Latest Ref Range: 4.22-5.81 MIL/uL 3.31 (L)   Hemoglobin Latest Ref Range: 13.0-17.0 g/dL 9.3 (L)   HCT Latest Ref Range: 39.0-52.0 % 28.7 (L)   MCV Latest Ref Range: 78.0-100.0 fL 86.7   MCH Latest Ref Range: 26.0-34.0 pg 28.1   MCHC Latest Ref Range: 30.0-36.0 g/dL 32.4   RDW Latest Ref Range: 11.5-15.5 % 13.0   Platelets Latest Ref Range: 150-400 K/uL 145 (L)   FINAL DIAGNOSIS Diagnosis Bone Marrow, Aspirate,Biopsy, and Clot - NORMOCELLULAR MARROW WITH ATYPICAL PLASMACYTOSIS (10%). - SEE COMMENT. PERIPHERAL BLOOD: - LEUKOPENIA AND NORMOCYTIC ANEMIA. - MILD  THROMBOCYTOPENIA. Diagnosis Note The bone marrow is normocellular, however, there is a mild increase in plasma cells (10% by aspirate and CD138). There are scattered atypical forms. Light chain immunohistochemistry is technically unsatisfactory and fails to stain the plasma cells. Slides will be sent out for light chain in situ hybridization and the results reported in an addendum. There is no significant dysplasia in any lineage. Vicente Males MD Pathologist, Electronic Signature  BONE MARROW REPORT Signed Others Kappa and lambda in situ hybridization is technically unsatisfactory and fails to highlight the plasma cells. (JM:ecj 12/24/2014) Vicente Males MD Pathologist, Electronic    RADIOGRAPHIC STUDIES: I have personally reviewed the radiological images as listed and agreed with the findings in the report.   CLINICAL DATA: Elevated hepatic function studies ; history of chronic renal insufficiency  EXAM: ULTRASOUND ABDOMEN COMPLETE  COMPARISON: None.  FINDINGS: Gallbladder: The gallbladder is adequately distended and skin pains echogenic sludge and non shadowing but mobile stones. There is no gallbladder wall thickening or pericholecystic fluid. There is no positive sonographic Murphy's sign.  Common bile duct: Diameter: 6 mm  Liver: The liver exhibits no focal mass nor ductal dilation. The echotexture is normal.  IVC: No abnormality visualized.  Pancreas: Visualized portion unremarkable.  Spleen: The spleen is mildly enlarged with a calculated volume of 485 cc  Right Kidney: Length: 10.1 cm. In the midpole of the right kidney there is a simple appearing cyst measuring 2.1 x 1.5 x 2.1 cm. There is no hydronephrosis.  Left Kidney: Length: 11 cm. In the mid to lower pole there is a 1.5 x 1.5 x 1.2 cm diameter simple appearing cyst. There is no  hydronephrosis.  Abdominal aorta: No aneurysm visualized.  Other findings: None.  IMPRESSION: 1.  Gallstones and sludge without evidence of acute cholecystitis. 2. The liver and pancreas and common bile duct exhibit no acute abnormality. There is mild splenomegaly. 3. The renal cortical echotexture is mildly increased consistent with medical renal disease. There is no hydronephrosis. There are simple appearing cysts in both kidneys.   Electronically Signed  By: David Martinique  On: 05/14/2014 10:20   CLINICAL DATA: Renal failure. Plasmacytosis.  EXAM: METASTATIC BONE SURVEY  COMPARISON: None.  FINDINGS: Imaging of the axial and appendicular skeleton performed. Diffuse osteopenia is noted. Subtle lucencies in the skull appear corticated and are most likely vascular lakes. Evidence of old infarcts noted about the distal femurs as well as of proximal and distal tibias. Diffuse multilevel degenerative changes are present. Peripheral vascular calcification is noted. Carotid vascular calcification . Cardiac pacer noted. Prior CABG. Cardiomegaly. No pulmonary venous congestion.  IMPRESSION: 1. Diffuse osteopenia.  2. Old infarcts about the distal femurs as well as of proximal and distal tibias.  3. Multifocal degenerative change.  3. Peripheral vascular disease. Carotid vascular disease. Prior CABG. Cardiac pacer noted. Cardiomegaly. No CHF .   Electronically Signed  By: Marcello Moores Register  On: 12/31/2014 12:45  ASSESSMENT & PLAN:  Anemia, normochromic, normocytic Stage III CKD Grossly abnormal stomach on EGD, gastric polyps History of IV Iron replacement Elevated ESR at 81 mm/hr BMBX with normal cytogenetics, 10% plasma cells, but light chain immunohistochemistry and in situ hybridization or unsatisfactory Myeloma survey with osteopenia EGD and colonoscopy in 03/2014 No M-spike, polyclonal increase in gamma globulin   Hemoglobin was excellent during aranesp use. He has not had any since January with significant decline in counts. I have  recommended re-initiating his aranesp. He will receive this today.   We will check a U/A given his frequent urination and confusion to r/o UTI. I have also requested that his caregiver take him to the ED for further evaluation today and he agrees.   He has evidence of diffuse osteopenia, anemia, chronic kidney disease and bone marrow biopsy shows 10% plasma cells but unfortunately I cannot determine monoclonality from his marrow. Cytogenetics were normal.  Note that peripherally he does not have an M spike but has evidence of polyclonal plasmacytosis.  He will return in one month with repeat labs and for further assessment.   Orders Placed This Encounter  Procedures  . Urinalysis, Routine w reflex microscopic    Standing Status: Future     Number of Occurrences: 1     Standing Expiration Date: 09/21/2016  . Urine microscopic-add on    All questions were answered. The patient knows to call the clinic with any problems, questions or concerns.    This note was electronically signed.    This document serves as a record of services personally performed by Ancil Linsey, MD. It was created on her behalf by Toni Amend, a trained medical scribe. The creation of this record is based on the scribe's personal observations and the provider's statements to them. This document has been checked and approved by the attending provider.  I have reviewed the above documentation for accuracy and completeness, and I agree with the above.  Kelby Fam. Whitney Muse, MD

## 2015-09-23 ENCOUNTER — Encounter (HOSPITAL_COMMUNITY): Payer: Self-pay | Admitting: Hematology & Oncology

## 2015-09-26 LAB — CBG MONITORING, ED: GLUCOSE-CAPILLARY: 81 mg/dL (ref 65–99)

## 2015-09-27 LAB — CULTURE, BODY FLUID-BOTTLE: CULTURE: NO GROWTH

## 2015-09-28 ENCOUNTER — Ambulatory Visit: Payer: Medicare HMO | Admitting: Gastroenterology

## 2015-09-30 ENCOUNTER — Encounter: Payer: Self-pay | Admitting: Gastroenterology

## 2015-09-30 ENCOUNTER — Other Ambulatory Visit: Payer: Self-pay

## 2015-09-30 ENCOUNTER — Ambulatory Visit (INDEPENDENT_AMBULATORY_CARE_PROVIDER_SITE_OTHER): Payer: Medicare HMO | Admitting: Gastroenterology

## 2015-09-30 VITALS — BP 146/78 | HR 74 | Temp 97.8°F | Ht 63.0 in | Wt 120.4 lb

## 2015-09-30 DIAGNOSIS — K317 Polyp of stomach and duodenum: Secondary | ICD-10-CM

## 2015-09-30 DIAGNOSIS — K2990 Gastroduodenitis, unspecified, without bleeding: Secondary | ICD-10-CM

## 2015-09-30 DIAGNOSIS — K2970 Gastritis, unspecified, without bleeding: Secondary | ICD-10-CM

## 2015-09-30 DIAGNOSIS — R195 Other fecal abnormalities: Secondary | ICD-10-CM | POA: Diagnosis not present

## 2015-09-30 DIAGNOSIS — D61818 Other pancytopenia: Secondary | ICD-10-CM

## 2015-09-30 DIAGNOSIS — D649 Anemia, unspecified: Secondary | ICD-10-CM

## 2015-09-30 NOTE — H&P (Addendum)
Primary Care Physician:  [name], MD  Primary Gastroenterologist:  Garfield Cornea, MD   Chief Complaint  Patient presents with  . Anemia    HPI:  Keith Young is a 65 y.o. male here At the request of Dr. Luan Pulling for further evaluation of anemia and heme positive stool. Patient has a history of anemia in the setting of stage III chronic renal disease followed by Dr. 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 Bone marrow biopsy previously with 10% plasma cells but unfortunately monoclonality from his marriages undeterminable. Patient had been noncompliant with Aranesp injections and subsequently had fall in his hemoglobin. He received a blood transfusion last month and subsequently has resumed Aranesp injections.  Labs on 08/31/2015 showed hemoglobin 8.2, hematocrit 25.6, white blood cell count 2.9, MCV 87.7, platelets 178,000. Previously worked up for potential bone marrow problem. Also with anemia of chronic disease. DRE by Dr. Luan Pulling, heme positive stool. Hgb 9.3 on 09/22/15.  Patient presents to the office today without current medication list. We have requested from pharmacy. He denies any symptoms such as abdominal pain, constipation, diarrhea, melena, rectal bleeding. Appetite is good. No vomiting. Caregiver states the patient has had episodes of confusion where he seems to be staring off into space. He was evaluated in the emergency department for this recently. CT of the head without any acute changes. History of previous infarct. Ammonia level unremarkable. Today in the office he is alert and oriented to person place and time.   EGD and colonoscopy November 2015. He had colonic diverticulosis. Diffusely abnormal gastric mucosa, biopsy chronic gastritis with intestinal metaplasia, erosive esophagitis, multiple benign gastric polyps. Too many polyps removed all of them.  Current Outpatient Prescriptions  Medication Sig Dispense  Refill  . aspirin 81 MG EC tablet Take 1 tablet (81 mg total) by mouth daily.    Marland Kitchen atorvastatin (LIPITOR) 80 MG tablet Take 80 mg by mouth daily.    . carvedilol (COREG) 25 MG tablet Take 12.5 mg by mouth 2 (two) times daily with a meal.     . Cholecalciferol (VITAMIN D3) 2000 UNITS TABS Take 2,000 mg by mouth daily.    . cloNIDine (CATAPRES - DOSED IN MG/24 HR) 0.2 mg/24hr patch Place 0.2 mg onto the skin once a week.    . clopidogrel (PLAVIX) 75 MG tablet Take 75 mg by mouth daily.    . digoxin (LANOXIN) 0.125 MG tablet 0.5 tablets daily.    . ferrous sulfate 325 (65 FE) MG tablet Take 325 mg by mouth daily with breakfast.    . furosemide (LASIX) 80 MG tablet Take 80 mg by mouth 2 (two) times daily.    . hydrALAZINE (APRESOLINE) 25 MG tablet 75 mg 3 (three) times daily.    . insulin lispro (HUMALOG) 100 UNIT/ML injection Inject 2-6 Units into the skin 3 (three) times daily with meals.    . isosorbide dinitrate (ISORDIL) 20 MG tablet     . mirabegron ER (MYRBETRIQ) 25 MG TB24 tablet Take 25 mg by mouth daily.    Marland Kitchen NOVOLOG FLEXPEN 100 UNIT/ML FlexPen Up to 28 units daily sliding scale    . pantoprazole (PROTONIX) 40 MG tablet Take 1 tablet (40 mg total) by mouth daily. 30 tablet 11  . traMADol (ULTRAM) 50 MG tablet Take 50 mg by mouth every 6 (six) hours as needed.      No current facility-administered medications for this visit.    Allergies  as of 09/30/2015  . (No Known Allergies)    Past Medical History  Diagnosis Date  . Chronic systolic heart failure (HCC)     NYHA Class III  . Type 2 diabetes mellitus ([place])   . Nonischemic cardiomyopathy (HCC)     EF 15-20%  . Essential hypertension, benign   . CKD (chronic kidney disease) stage 3, GFR 30-59 ml/min   . Anemia   . Hyperlipidemia   . History of stroke   . Bell's palsy   . Cardiomyopathy ([place])   . Aortic insufficiency   . Alcoholism in remission ([place])   . Noncompliance with medication regimen   . NSVT (nonsustained  ventricular tachycardia) ([place])   . Cardiac defibrillator in situ   . COPD (chronic obstructive pulmonary disease) ([place])   . CVA (cerebral infarction) 01/2013  . Productive cough 08/2013    With brown sputum   . SOB (shortness of breath) on exertion 08/2013  . Numbness of right jaw     Had a stoke in 01/2013. Numbness is occasional, especially when trying to chew.  . At moderate risk for fall   . AICD (automatic cardioverter/defibrillator) present   . Stroke [facility])     weakness of right side from CVA  . Arthritis     Past Surgical History  Procedure Laterality Date  . Cardiac defibrillator placement  11/12/12    [employer] Inogen MINI ICD implanted in [place] at [facility]  . Cardiac surgery      bioprostetic AVR per Dr Nelly Laurence note  . Colonoscopy with propofol N/A 04/10/2014    RMR: Colonic Diverticulosis  . Esophagogastroduodenoscopy (egd) with propofol N/A 04/10/2014    RMR: Mild erosive reflux esophagitis. Multiple antral polyps likely hyperplastic status post removal by hot snare cautery technique. Diffusely abnormal stomach status post gastric biopsy I suspect some of patients anemaia may be due to intermittent oozing from the stomach. It would be difficult and a risky proposition to attempt complete removal of all of his gastric polyps.  . Polypectomy N/A 04/10/2014    Procedure: GASTRIC POLYPECTOMY;  Surgeon: Daneil Dolin, MD;  Location: [facility];  Service: Endoscopy;  Laterality: N/A;  . Biopsy N/A 04/10/2014    Procedure: GASTRIC BIOPSY;  Surgeon: Daneil Dolin, MD;  Location: [facility];  Service: Endoscopy;  Laterality: N/A;  . Right heart catheterization N/A 02/06/2014    Procedure: RIGHT HEART CATH;  Surgeon: Larey Dresser, MD;  Location[facility] C[facility] LAB;  Service: Cardiovascular;  Laterality: N/A;    Family History  Problem Relation Age of Onset  . Kidney disease Mother   . Kidney disease Sister   . Colon cancer Neg Hx     Social History   Social  History  . Marital Status: Single    Spouse Name: N/A  . Number of Children: N/A  . Years of Education: N/A   Occupational History  . Not on file.   Social History Main Topics  . Smoking status: Former Smoker    Types: Cigarettes    Quit date: 11/05/1993  . Smokeless tobacco: Never Used  . Alcohol Use: Yes     Comment: former heavy ETOH, none recently  . Drug Use: No     Comment: prior history of cocaine use, smoking   . Sexual Activity: Yes    Birth Control/ Protection: None   Other Topics Concern  . Not on file   Social History Narrative   Recently moved from [place] to be  with family in [place]                  ROS:  General: Negative for anorexia, weight loss, fever, chills, fatigue, weakness. Eyes: Negative for vision changes.  ENT: Negative for hoarseness, difficulty swallowing , nasal congestion. CV: Negative for chest pain, angina, palpitations, dyspnea on exertion, peripheral edema.  Respiratory: Negative for dyspnea at rest, dyspnea on exertion, cough, sputum, wheezing.  GI: See history of present illness. GU:  Negative for dysuria, hematuria, urinary incontinence, urinary frequency, nocturnal urination.  MS: Negative for joint pain, low back pain.  Derm: Negative for rash or itching.  Neuro: Negative for weakness, abnormal sensation, seizure, frequent headaches, memory loss, confusion.  Psych: Negative for anxiety, depression, suicidal ideation, hallucinations.  Endo: Negative for unusual weight change.  Heme: Negative for bruising or bleeding. Allergy: Negative for rash or hives.    Physical Examination:  BP 146/78 mmHg  Pulse 74  Temp(Src) 97.8 F (36.6 C) (Oral)  Ht _0  (1.6 m)  Wt 120 lb 6.4 oz (54.613 kg)  BMI 21.33 kg/m2   General: Well-nourished, well-developed in no acute distress.  Head: Normocephalic, atraumatic.   Eyes: Conjunctiva pink, no icterus. Mouth: Oropharyngeal mucosa moist and pink , no lesions erythema or  exudate. Neck: Supple without thyromegaly, masses, or lymphadenopathy.  Lungs: Clear to auscultation bilaterally.  Heart: Regular rate and rhythm, no murmurs rubs or gallops.  Abdomen: Bowel sounds are normal, nontender, nondistended, no hepatosplenomegaly or masses, no abdominal bruits or    hernia , no rebound or guarding.   Rectal: not performed Extremities: No lower extremity edema. No clubbing or deformities.  Neuro: Alert and oriented x 4 , grossly normal neurologically.  Skin: Warm and dry, no rash or jaundice.   Psych: Alert and cooperative, normal mood and affect.  Labs: Lab Results  Component Value Date   WBC 3.7* 09/22/2015   HGB 9.3* 09/22/2015   HCT 28.7* 09/22/2015   MCV 86.7 09/22/2015   PLT 145* 09/22/2015   Lab Results  Component Value Date   IRON 33* 09/08/2015   TIBC 172* 09/08/2015   FERRITIN 230 09/08/2015   Lab Results  Component Value Date   CREATININE 1.88* 09/22/2015   BUN 43* 09/22/2015   NA 134* 09/22/2015   K 4.4 09/22/2015   CL 104 09/22/2015   CO2 24 09/22/2015   Lab Results  Component Value Date   ALT 28 09/22/2015   AST 39 09/22/2015   ALKPHOS 82 09/22/2015   BILITOT 0.4 09/22/2015     Imaging Studies: Ct Head Wo Contrast  09/22/2015  CLINICAL DATA:  Altered mental status for several weeks EXAM: CT HEAD WITHOUT CONTRAST TECHNIQUE: Contiguous axial images were obtained from the base of the skull through the vertex without intravenous contrast. COMPARISON:  04/28/2015 FINDINGS: The bony calvarium is intact. No gross soft tissue abnormality is noted. Atrophic changes and chronic white matter ischemic changes are seen and stable. Changes of prior infarct are noted in the right posterior parietal region. Small lacunar infarcts are noted within the thalami, right pons and basal ganglia. No findings to suggest acute hemorrhage, acute infarction or space-occupying mass lesion is seen. IMPRESSION: Chronic changes without acute abnormality.  Electronically Signed   By: Inez Catalina M.D.   On: 09/22/2015 13:43    Impression:/Plan: 65 year old gentleman with multifactorial anemia in the setting of chronic renal disease, IDA, potential bone marrow issue with pancytopenia who presents with heme positive stools. Patient known to  have abnormal gastric mucosa and multiple gastric polyps on previous EGD as outlined above. Suspected be contributing to anemia at that time. Consider reevaluation of upper GI track. Doubt colonoscopy will provide significant information given recent evaluation. Patient is interested in pursuing upper endoscopy. Plan on sedation in the OR secondary to polypharmacy and h/o etoh use.  I have discussed the risks, alternatives, benefits with regards to but not limited to the risk of reaction to medication, bleeding, infection, perforation and the patient is agreeable to proceed. Written consent to be obtained.  Patient has a defibrillator.

## 2015-09-30 NOTE — Patient Instructions (Signed)
1. Upper endoscopy with Dr. Gala Romney. Please see separate instructions.

## 2015-10-01 NOTE — Progress Notes (Signed)
I called (had to leave a voicemail) and requested a copy of his med list. It has not come in yet.

## 2015-10-01 NOTE — Progress Notes (Signed)
Did we obtain copy of med list from [facility] [employer] yet?

## 2015-10-02 NOTE — Progress Notes (Signed)
cc'ed to pcp °

## 2015-10-02 NOTE — Progress Notes (Signed)
Med list is on your desk.

## 2015-10-02 NOTE — Addendum Note (Signed)
Addended by: Mahala Menghini on: 10/02/2015 08:18 AM   Modules accepted: Medications

## 2015-10-02 NOTE — Progress Notes (Signed)
Updated meds in EPIC and this note.

## 2015-10-06 ENCOUNTER — Encounter (HOSPITAL_COMMUNITY): Payer: Medicare HMO | Attending: Hematology & Oncology

## 2015-10-06 ENCOUNTER — Encounter (HOSPITAL_BASED_OUTPATIENT_CLINIC_OR_DEPARTMENT_OTHER): Payer: Medicare HMO

## 2015-10-06 DIAGNOSIS — D631 Anemia in chronic kidney disease: Secondary | ICD-10-CM | POA: Diagnosis not present

## 2015-10-06 DIAGNOSIS — N189 Chronic kidney disease, unspecified: Principal | ICD-10-CM

## 2015-10-06 DIAGNOSIS — N183 Chronic kidney disease, stage 3 (moderate): Secondary | ICD-10-CM

## 2015-10-06 DIAGNOSIS — D649 Anemia, unspecified: Secondary | ICD-10-CM | POA: Diagnosis not present

## 2015-10-06 LAB — CBC
HCT: 30.3 % — ABNORMAL LOW (ref 39.0–52.0)
Hemoglobin: 9.7 g/dL — ABNORMAL LOW (ref 13.0–17.0)
MCH: 28.3 pg (ref 26.0–34.0)
MCHC: 32 g/dL (ref 30.0–36.0)
MCV: 88.3 fL (ref 78.0–100.0)
PLATELETS: 176 10*3/uL (ref 150–400)
RBC: 3.43 MIL/uL — AB (ref 4.22–5.81)
RDW: 14 % (ref 11.5–15.5)
WBC: 2.7 10*3/uL — AB (ref 4.0–10.5)

## 2015-10-06 MED ORDER — DARBEPOETIN ALFA 60 MCG/0.3ML IJ SOSY
50.0000 ug | PREFILLED_SYRINGE | Freq: Once | INTRAMUSCULAR | Status: AC
  Administered 2015-10-06: 50 ug via SUBCUTANEOUS

## 2015-10-06 MED ORDER — DARBEPOETIN ALFA 60 MCG/0.3ML IJ SOSY
PREFILLED_SYRINGE | INTRAMUSCULAR | Status: AC
  Filled 2015-10-06: qty 0.3

## 2015-10-06 NOTE — Patient Instructions (Signed)
Keith Young  10/06/2015     @PREFPERIOPPHARMACY @   Your procedure is scheduled on  10/12/2015 .  Report to [facility] at  745  A.M.  Call this number if you have problems the morning of surgery:  [phone]   Remember:  Do not eat food or drink liquids after midnight.  Take these medicines the morning of surgery with A SIP OF WATER  Coreg, digoxin, isordil, protonix, ultram. Take 1/2 of your usual dose the night before your procedure. DO NOT take any medications for diabetes the morning of surgery.   Do not wear jewelry, make-up or nail polish.  Do not wear lotions, powders, or perfumes.  You may wear deodorant.  Do not shave 48 hours prior to surgery.  Men may shave face and neck.  Do not bring valuables to the hospital.  [facility] is not responsible for any belongings or valuables.  Contacts, dentures or bridgework may not be worn into surgery.  Leave your suitcase in the car.  After surgery it may be brought to your room.  For patients admitted to the hospital, discharge time will be determined by your treatment team.  Patients discharged the day of surgery will not be allowed to drive home.   Name and phone number of your driver:   family Special instructions:  Follow the diet instructions given to you by Dr Roseanne Kaufman office  Please read over the following fact sheets that you were given. Coughing and Deep Breathing, Surgical Site Infection Prevention, Anesthesia Post-op Instructions and Care and Recovery After Surgery      Esophagogastroduodenoscopy Esophagogastroduodenoscopy (EGD) is a procedure that is used to examine the lining of the esophagus, stomach, and first part of the small intestine (duodenum). A long, flexible, lighted tube with a camera attached (endoscope) is inserted down the throat to view these organs. This procedure is done to detect problems or abnormalities, such as inflammation, bleeding, ulcers, or growths, in order to treat them. The  procedure lasts 5-20 minutes. It is usually an outpatient procedure, but it may need to be performed in a hospital in emergency cases. LET [facility] CARE PROVIDER KNOW ABOUT:  Any allergies you have.  All medicines you are taking, including vitamins, herbs, eye drops, creams, and over-the-counter medicines.  Previous problems you or members of your family have had with the use of anesthetics.  Any blood disorders you have.  Previous surgeries you have had.  Medical conditions you have. RISKS AND COMPLICATIONS Generally, this is a safe procedure. However, problems can occur and include:  Infection.  Bleeding.  Tearing (perforation) of the esophagus, stomach, or duodenum.  Difficulty breathing or not being able to breathe.  Excessive sweating.  Spasms of the larynx.  Slowed heartbeat.  Low blood pressure. BEFORE THE PROCEDURE  Do not eat or drink anything after midnight on the night before the procedure or as directed by your health care provider.  Do not take your regular medicines before the procedure if your health care provider asks you not to. Ask your health care provider about changing or stopping those medicines.  If you wear dentures, be prepared to remove them before the procedure.  Arrange for someone to drive you home after the procedure. PROCEDURE  A numbing medicine (local anesthetic) may be sprayed in your throat for comfort and to stop you from gagging or coughing.  You will have an IV tube inserted in a vein in your  hand or arm. You will receive medicines and fluids through this tube.  You will be given a medicine to relax you (sedative).  A pain reliever will be given through the IV tube.  A mouth guard may be placed in your mouth to protect your teeth and to keep you from biting on the endoscope.  You will be asked to lie on your left side.  The endoscope will be inserted down your throat and into your esophagus, stomach, and duodenum.  Air  will be put through the endoscope to allow your health care provider to clearly view the lining of your esophagus.  The lining of your esophagus, stomach, and duodenum will be examined. During the exam, your health care provider may:  Remove tissue to be examined under a microscope (biopsy) for inflammation, infection, or other medical problems.  Remove growths.  Remove objects (foreign bodies) that are stuck.  Treat any bleeding with medicines or other devices that stop tissues from bleeding (hot cautery, clipping devices).  Widen (dilate) or stretch narrowed areas of your esophagus and stomach.  The endoscope will be withdrawn. AFTER THE PROCEDURE  You will be taken to a recovery area for observation. Your blood pressure, heart rate, breathing rate, and blood oxygen level will be monitored often until the medicines you were given have worn off.  Do not eat or drink anything until the numbing medicine has worn off and your gag reflex has returned. You may choke.  Your health care provider should be able to discuss his or her findings with you. It will take longer to discuss the test results if any biopsies were taken.   This information is not intended to replace advice given to you by your health care provider. Make sure you discuss any questions you have with your health care provider.   Document Released: 09/16/2004 Document Revised: 06/06/2014 Document Reviewed: 04/18/2012 Elsevier Interactive Patient Education 2016 [place]. Esophagogastroduodenoscopy, Care After Refer to this sheet in the next few weeks. These instructions provide you with information about caring for yourself after your procedure. Your health care provider may also give you more specific instructions. Your treatment has been planned according to current medical practices, but problems sometimes occur. Call your health care provider if you have any problems or questions after your procedure. WHAT TO EXPECT  AFTER THE PROCEDURE After your procedure, it is typical to feel:  Soreness in your throat.  Pain with swallowing.  Sick to your stomach (nauseous).  Bloated.  Dizzy.  Fatigued. HOME CARE INSTRUCTIONS  Do not eat or drink anything until the numbing medicine (local anesthetic) has worn off and your gag reflex has returned. You will know that the local anesthetic has worn off when you can swallow comfortably.  Do not drive or operate machinery until directed by your health care provider.  Take medicines only as directed by your health care provider. SEEK MEDICAL CARE IF:   You cannot stop coughing.  You are not urinating at all or less than usual. SEEK IMMEDIATE MEDICAL CARE IF:  You have difficulty swallowing.  You cannot eat or drink.  You have worsening throat or chest pain.  You have dizziness or lightheadedness or you faint.  You have nausea or vomiting.  You have chills.  You have a fever.  You have severe abdominal pain.  You have black, tarry, or bloody stools.   This information is not intended to replace advice given to you by your health care provider. Make sure  you discuss any questions you have with your health care provider.   Document Released: 05/02/2012 Document Revised: 06/06/2014 Document Reviewed: 05/02/2012 Elsevier Interactive Patient Education 2016 Elsevier Inc. PATIENT INSTRUCTIONS POST-ANESTHESIA  IMMEDIATELY FOLLOWING SURGERY:  Do not drive or operate machinery for the first twenty four hours after surgery.  Do not make any important decisions for twenty four hours after surgery or while taking narcotic pain medications or sedatives.  If you develop intractable nausea and vomiting or a severe headache please notify your doctor immediately.  FOLLOW-UP:  Please make an appointment with your surgeon as instructed. You do not need to follow up with anesthesia unless specifically instructed to do so.  WOUND CARE INSTRUCTIONS (if applicable):   Keep a dry clean dressing on the anesthesia/puncture wound site if there is drainage.  Once the wound has quit draining you may leave it open to air.  Generally you should leave the bandage intact for twenty four hours unless there is drainage.  If the epidural site drains for more than 36-48 hours please call the anesthesia department.  QUESTIONS?:  Please feel free to call your physician or the hospital operator if you have any questions, and they will be happy to assist you.

## 2015-10-06 NOTE — Patient Instructions (Signed)
[facility] at [facility] Discharge Instructions  RECOMMENDATIONS MADE BY THE CONSULTANT AND ANY TEST RESULTS WILL BE SENT TO YOUR REFERRING PHYSICIAN. Aranesp today.    Thank you for choosing [facility] at [facility] to provide your oncology and hematology care.  To afford each patient quality time with our provider, please arrive at least 15 minutes before your scheduled appointment time.   Beginning January 23rd 2017 lab work for the Cancer Center will be done in the  Main lab at [facility] on 1st floor. If you have a lab appointment with the Cancer Center please come in thru the  Main Entrance and check in at the main information desk  You need to re-schedule your appointment should you arrive 10 or more minutes late.  We strive to give you quality time with our providers, and arriving late affects you and other patients whose appointments are after yours.  Also, if you no show three or more times for appointments you may be dismissed from the clinic at the providers discretion.     Again, thank you for choosing [facility].  Our hope is that these requests will decrease the amount of time that you wait before being seen by our physicians.       _____________________________________________________________  Should you have questions after your visit to  Cancer Center, please contact our office at [phone] between the hours of 8:30 a.m. and 4:30 p.m.  Voicemails left after 4:30 p.m. will not be returned until the following business day.  For prescription refill requests, have your pharmacy contact our office.         Resources For Cancer Patients and their Caregivers ? American Cancer Society: Can assist with transportation, wigs, general needs, runs Look Good Feel Better.        1-888-227-6333 ? Cancer Care: Provides financial assistance, online support groups, medication/co-pay assistance.  1-800-813-HOPE  (4673) ? [facility] Assists [place] cancer patients and their families through emotional , educational and financial support.  336-427-4357 ? [place] DSS Where to apply for food stamps, Medicaid and utility assistance. 336-342-1394 ? [facility]: Transportation to medical appointments. 336-347-2287 ? Social Security Administration: May apply for disability if have a Stage IV cancer. 336-342-7796 1-800-772-1213 ? [place] Aging, Disability and Transit Services: Assists with nutrition, care and transit needs. 336-349-2343  Cancer Center Support Programs: @10RELATIVEDAYS@ > Cancer Support Group  2nd Tuesday of the month 1pm-2pm, Journey Room  > Creative Journey  3rd Tuesday of the month 1130am-1pm, Journey Room  > Look Good Feel Better  1st Wednesday of the month 10am-12 noon, Journey Room (Call American Cancer Society to register 1-800-395-5775)    

## 2015-10-06 NOTE — Progress Notes (Signed)
Keith Young presents today for injection per MD orders. Aranesp 50 mcg administered SQ in right Abdomen. Administration without incident. Patient tolerated well.  

## 2015-10-07 ENCOUNTER — Encounter (HOSPITAL_COMMUNITY)
Admission: RE | Admit: 2015-10-07 | Discharge: 2015-10-07 | Disposition: A | Discharge status: Home / Self Care | Payer: Medicare HMO | Source: Ambulatory Visit | Attending: Internal Medicine | Admitting: Internal Medicine

## 2015-10-07 ENCOUNTER — Encounter (HOSPITAL_COMMUNITY): Payer: Self-pay

## 2015-10-12 ENCOUNTER — Ambulatory Visit (HOSPITAL_COMMUNITY)
Admission: RE | Admit: 2015-10-12 | Discharge: 2015-10-12 | Disposition: A | Discharge status: Home / Self Care | Payer: Medicare HMO | Source: Ambulatory Visit | Attending: Internal Medicine | Admitting: Internal Medicine

## 2015-10-12 ENCOUNTER — Ambulatory Visit (HOSPITAL_COMMUNITY): Payer: Medicare HMO | Admitting: Anesthesiology

## 2015-10-12 ENCOUNTER — Encounter (HOSPITAL_COMMUNITY)
Admission: RE | Disposition: A | Discharge status: Home / Self Care | Payer: Self-pay | Source: Ambulatory Visit | Attending: Internal Medicine

## 2015-10-12 ENCOUNTER — Encounter (HOSPITAL_COMMUNITY): Payer: Self-pay | Admitting: *Deleted

## 2015-10-12 DIAGNOSIS — Z79899 Other long term (current) drug therapy: Secondary | ICD-10-CM | POA: Insufficient documentation

## 2015-10-12 DIAGNOSIS — K3189 Other diseases of stomach and duodenum: Secondary | ICD-10-CM | POA: Insufficient documentation

## 2015-10-12 DIAGNOSIS — K317 Polyp of stomach and duodenum: Secondary | ICD-10-CM | POA: Diagnosis not present

## 2015-10-12 DIAGNOSIS — Z87891 Personal history of nicotine dependence: Secondary | ICD-10-CM | POA: Diagnosis not present

## 2015-10-12 DIAGNOSIS — D508 Other iron deficiency anemias: Secondary | ICD-10-CM | POA: Diagnosis not present

## 2015-10-12 DIAGNOSIS — Z794 Long term (current) use of insulin: Secondary | ICD-10-CM | POA: Diagnosis not present

## 2015-10-12 DIAGNOSIS — E1122 Type 2 diabetes mellitus with diabetic chronic kidney disease: Secondary | ICD-10-CM | POA: Insufficient documentation

## 2015-10-12 DIAGNOSIS — R195 Other fecal abnormalities: Secondary | ICD-10-CM | POA: Insufficient documentation

## 2015-10-12 DIAGNOSIS — I69351 Hemiplegia and hemiparesis following cerebral infarction affecting right dominant side: Secondary | ICD-10-CM | POA: Diagnosis not present

## 2015-10-12 DIAGNOSIS — I5022 Chronic systolic (congestive) heart failure: Secondary | ICD-10-CM | POA: Insufficient documentation

## 2015-10-12 DIAGNOSIS — K2940 Chronic atrophic gastritis without bleeding: Secondary | ICD-10-CM | POA: Diagnosis not present

## 2015-10-12 DIAGNOSIS — I2510 Atherosclerotic heart disease of native coronary artery without angina pectoris: Secondary | ICD-10-CM | POA: Diagnosis not present

## 2015-10-12 DIAGNOSIS — J449 Chronic obstructive pulmonary disease, unspecified: Secondary | ICD-10-CM | POA: Insufficient documentation

## 2015-10-12 DIAGNOSIS — I130 Hypertensive heart and chronic kidney disease with heart failure and stage 1 through stage 4 chronic kidney disease, or unspecified chronic kidney disease: Secondary | ICD-10-CM | POA: Diagnosis not present

## 2015-10-12 DIAGNOSIS — N183 Chronic kidney disease, stage 3 (moderate): Secondary | ICD-10-CM | POA: Diagnosis not present

## 2015-10-12 DIAGNOSIS — Z7982 Long term (current) use of aspirin: Secondary | ICD-10-CM | POA: Insufficient documentation

## 2015-10-12 DIAGNOSIS — Z950 Presence of cardiac pacemaker: Secondary | ICD-10-CM | POA: Diagnosis not present

## 2015-10-12 HISTORY — PX: BIOPSY: SHX5522

## 2015-10-12 HISTORY — PX: ESOPHAGOGASTRODUODENOSCOPY (EGD) WITH PROPOFOL: SHX5813

## 2015-10-12 LAB — GLUCOSE, CAPILLARY
GLUCOSE-CAPILLARY: 113 mg/dL — AB (ref 65–99)
Glucose-Capillary: 72 mg/dL (ref 65–99)

## 2015-10-12 SURGERY — ESOPHAGOGASTRODUODENOSCOPY (EGD) WITH PROPOFOL
Anesthesia: Monitor Anesthesia Care

## 2015-10-12 MED ORDER — LIDOCAINE VISCOUS 2 % MT SOLN
15.0000 mL | Freq: Once | OROMUCOSAL | Status: AC
  Administered 2015-10-12: 15 mL via OROMUCOSAL

## 2015-10-12 MED ORDER — MIDAZOLAM HCL 5 MG/5ML IJ SOLN
INTRAMUSCULAR | Status: DC | PRN
  Administered 2015-10-12 (×2): 1 mg via INTRAVENOUS

## 2015-10-12 MED ORDER — STERILE WATER FOR IRRIGATION IR SOLN
Status: DC | PRN
  Administered 2015-10-12: 09:00:00

## 2015-10-12 MED ORDER — FENTANYL CITRATE (PF) 100 MCG/2ML IJ SOLN
INTRAMUSCULAR | Status: AC
  Filled 2015-10-12: qty 2

## 2015-10-12 MED ORDER — MIDAZOLAM HCL 2 MG/2ML IJ SOLN
INTRAMUSCULAR | Status: AC
  Filled 2015-10-12: qty 2

## 2015-10-12 MED ORDER — FENTANYL CITRATE (PF) 100 MCG/2ML IJ SOLN: 25.0000 ug | INTRAMUSCULAR | Status: DC | PRN

## 2015-10-12 MED ORDER — DEXTROSE 50 % IV SOLN
25.0000 mL | Freq: Once | INTRAVENOUS | Status: AC
  Administered 2015-10-12: 25 mL via INTRAVENOUS

## 2015-10-12 MED ORDER — LACTATED RINGERS IV SOLN
INTRAVENOUS | Status: DC
  Administered 2015-10-12: 09:00:00 via INTRAVENOUS

## 2015-10-12 MED ORDER — DEXTROSE 50 % IV SOLN
INTRAVENOUS | Status: AC
  Filled 2015-10-12: qty 50

## 2015-10-12 MED ORDER — PROPOFOL 10 MG/ML IV BOLUS
INTRAVENOUS | Status: AC
  Filled 2015-10-12: qty 20

## 2015-10-12 MED ORDER — PROPOFOL 500 MG/50ML IV EMUL
INTRAVENOUS | Status: DC | PRN
  Administered 2015-10-12: 100 ug/kg/min via INTRAVENOUS

## 2015-10-12 MED ORDER — ONDANSETRON HCL 4 MG/2ML IJ SOLN
4.0000 mg | Freq: Once | INTRAMUSCULAR | Status: DC | PRN
Start: 2015-10-12 — End: 2015-10-12

## 2015-10-12 MED ORDER — MIDAZOLAM HCL 2 MG/2ML IJ SOLN
1.0000 mg | INTRAMUSCULAR | Status: DC | PRN
  Administered 2015-10-12: 2 mg via INTRAVENOUS

## 2015-10-12 MED ORDER — FENTANYL CITRATE (PF) 100 MCG/2ML IJ SOLN
25.0000 ug | INTRAMUSCULAR | Status: AC
  Administered 2015-10-12: 100 ug via INTRAVENOUS

## 2015-10-12 MED ORDER — LIDOCAINE VISCOUS 2 % MT SOLN
OROMUCOSAL | Status: AC
  Filled 2015-10-12: qty 15

## 2015-10-12 NOTE — Interval H&P Note (Signed)
History and Physical Interval Note:  10/12/2015 9:25 AM  Keith Young  has presented today for surgery, with the diagnosis of history of gastric polyps, heme postive stool  The various methods of treatment have been discussed with the patient and family. After consideration of risks, benefits and other options for treatment, the patient has consented to  Procedure(s) with comments: ESOPHAGOGASTRODUODENOSCOPY (EGD) WITH PROPOFOL (N/A) - 0845 - moved to 9:15 as a surgical intervention .  The patient's history has been reviewed, patient examined, no change in status, stable for surgery.  I have reviewed the patient's chart and labs.  Questions were answered to the patient's satisfaction.     Keith Young  No change. Diagnostic EGD per plan.The risks, benefits, limitations, alternatives and imponderables have been reviewed with the patient. Potential for esophageal dilation, biopsy, etc. have also been reviewed.  Questions have been answered. All parties agreeable.

## 2015-10-12 NOTE — Anesthesia Preprocedure Evaluation (Signed)
Anesthesia Evaluation  Patient identified by MRN, date of birth, ID band Patient awake    Reviewed: Allergy & Precautions, H&P , NPO status , Patient's Chart, lab work & pertinent test results, reviewed documented beta blocker date and time   Airway Mallampati: II  TM Distance: >3 FB     Dental  (+) Poor Dentition, Chipped, Missing, Dental Advisory Given   Pulmonary shortness of breath and with exertion, COPD, former smoker,    breath sounds clear to auscultation       Cardiovascular hypertension, Pt. on medications and Pt. on home beta blockers + CAD, +CHF and + DOE  + Cardiac Defibrillator  Rhythm:Regular Rate:Normal     Neuro/Psych CVA, Residual Symptoms    GI/Hepatic (+)     substance abuse  alcohol use,   Endo/Other  diabetes, Poorly Controlled, Type 2, Oral Hypoglycemic Agents  Renal/GU Renal InsufficiencyRenal disease     Musculoskeletal   Abdominal   Peds  Hematology  (+) anemia ,   Anesthesia Other Findings   Reproductive/Obstetrics                             Anesthesia Physical Anesthesia Plan  ASA: IV  Anesthesia Plan: MAC   Post-op Pain Management:    Induction: Intravenous  Airway Management Planned: Simple Face Mask  Additional Equipment:   Intra-op Plan:   Post-operative Plan:   Informed Consent: I have reviewed the patients History and Physical, chart, labs and discussed the procedure including the risks, benefits and alternatives for the proposed anesthesia with the patient or authorized representative who has indicated his/her understanding and acceptance.     Plan Discussed with:   Anesthesia Plan Comments:         Anesthesia Quick Evaluation

## 2015-10-12 NOTE — Op Note (Signed)
[facility] Patient Name: Keith Young Procedure Date: 10/12/2015 9:12 AM MRN: [id number] Date of Birth: May 23, 1951 Attending MD: Norvel Richards , MD CSN: [id number] Age: 65 Admit Type: Outpatient Procedure:                Upper GI endoscopy with gastric biopsy Indications:              Heme positive stool Providers:                Norvel Richards, MD, Rosina Lowenstein, RN, Lurline Del, RN, Georgeann Oppenheim, Technician Referring MD:             Sinda Du Medicines:                Monitored Anesthesia Care Complications:            No immediate complications. Estimated Blood Loss:     Estimated blood loss: none. Procedure:                Pre-Anesthesia Assessment:                           - Prior to the procedure, a History and Physical                            was performed, and patient medications and                            allergies were reviewed. The patient's tolerance of                            previous anesthesia was also reviewed. The risks                            and benefits of the procedure and the sedation                            options and risks were discussed with the patient.                            All questions were answered, and informed consent                            was obtained. Prior Anticoagulants: The patient                            last took Plavix (clopidogrel) 1 day prior to the                            procedure. ASA Grade Assessment: III - A patient                            with severe systemic disease. After reviewing the  risks and benefits, the patient was deemed in                            satisfactory condition to undergo the procedure.                           After obtaining informed consent, the endoscope was                            passed under direct vision. Throughout the                            procedure, the patient's blood pressure,  pulse, and                            oxygen saturations were monitored continuously. The                            [id number] scope was introduced through the                            mouth, and advanced to the second part of duodenum.                            The upper GI endoscopy was accomplished without                            difficulty. The patient tolerated the procedure                            well. Scope In: 9:43:40 AM Scope Out: 9:48:03 AM Total Procedure Duration: 0 hours 4 minutes 23 seconds  Findings:      The examined esophagus was normal.      Multiple 20 mm pedunculated and sessile polyps were found in the gastric       antrum. This was biopsied with a cold forceps for histology. Stands out       from the remainder of the gastric mucosa. Small hiatal hernia. Patent       pylorus. The abnormal patch of proximal gastric mucosa biopsied .       multilobulated sessile and pedunculated with the largest 2 cm in the       antrum. Some of these were noted to have hemorrhagic mucosa. No attempt       at removing polyps today given he is on Plavix ?"increased risk of acute       bleeding. Estimated blood loss was minimal.      The second portion of the duodenum was normal. Impression:               - Normal esophagus.                           - Multiple gastric polyps. . Patch of                            hyperpigmented appearing mucosa up in the  body                            about 3 centimeters. Question overlying iron                            pigment. - Normal second portion of the duodenum. I                            suspect patient is bleeding from time to time from                            the gastric polyps. I suspect anemia is                            multifactorial. His small bowel has not been                            studied. Capsule endoscopy not an ideal approach                            because of AICD. Moderate Sedation:       Moderate (conscious) sedation was personally administered by an       anesthesia professional. The following parameters were monitored: oxygen       saturation, heart rate, blood pressure, respiratory rate, EKG, adequacy       of pulmonary ventilation, and response to care. Total physician       intraservice time was 15 minutes. Recommendation:           - Patient has a contact number available for                            emergencies. The signs and symptoms of potential                            delayed complications were discussed with the                            patient. Return to normal activities tomorrow.                            Written discharge instructions were provided to the                            patient.                           - Advance diet as tolerated.                           - Continue present medications.                           - Await pathology results.                           -  Await pathology results. Patient may be best                            served by further evaluation and management at a                            tertiary referral center.                           - Repeat upper endoscopy (date not yet determined)                            for surveillance based on pathology results.                           - Return to GI office (date not yet determined). Procedure Code(s):        --- Professional ---                           [phone], Esophagogastroduodenoscopy, flexible,                            transoral; with biopsy, single or multiple Diagnosis Code(s):        --- Professional ---                           K31.7, Polyp of stomach and duodenum                           R19.5, Other fecal abnormalities CPT copyright 2016 American Medical Association. All rights reserved. The codes documented in this report are preliminary and upon coder review may  be revised to meet current compliance requirements. Cristopher Estimable. Deshara Rossi, MD Norvel Richards, MD 10/12/2015 10:06:00 AM This report has been signed electronically. Number of Addenda: 0

## 2015-10-12 NOTE — Transfer of Care (Signed)
Immediate Anesthesia Transfer of Care Note  Patient: Keith Young  Procedure(s) Performed: Procedure(s) with comments: ESOPHAGOGASTRODUODENOSCOPY (EGD) WITH PROPOFOL (N/A) - 0845 - moved to 9:15 BIOPSY - stomach bx's  Patient Location: PACU  Anesthesia Type:MAC  Level of Consciousness: awake  Airway & Oxygen Therapy: Patient Spontanous Breathing and Patient connected to face mask oxygen  Post-op Assessment: Report given to RN, Post -op Vital signs reviewed and stable and Patient moving all extremities  Post vital signs: Reviewed and stable  Last Vitals:  Filed Vitals:   10/12/15 0910 10/12/15 0915  BP: 136/77 139/75  Temp:    Resp: 12 12    Last Pain: There were no vitals filed for this visit.    Patients Stated Pain Goal: 7 ([id number])  Complications: No apparent anesthesia complications

## 2015-10-12 NOTE — Discharge Instructions (Signed)
EGD Discharge instructions Please read the instructions outlined below and refer to this sheet in the next few weeks. These discharge instructions provide you with general information on caring for yourself after you leave the hospital. Your doctor may also give you specific instructions. While your treatment has been planned according to the most current medical practices available, unavoidable complications occasionally occur. If you have any problems or questions after discharge, please call your doctor. ACTIVITY  You may resume your regular activity but move at a slower pace for the next 24 hours.   Take frequent rest periods for the next 24 hours.   Walking will help expel (get rid of) the air and reduce the bloated feeling in your abdomen.   No driving for 24 hours (because of the anesthesia (medicine) used during the test).   You may shower.   Do not sign any important legal documents or operate any machinery for 24 hours (because of the anesthesia used during the test).  NUTRITION  Drink plenty of fluids.   You may resume your normal diet.   Begin with a light meal and progress to your normal diet.   Avoid alcoholic beverages for 24 hours or as instructed by your caregiver.  MEDICATIONS  You may resume your normal medications unless your caregiver tells you otherwise.  WHAT YOU CAN EXPECT TODAY  You may experience abdominal discomfort such as a feeling of fullness or gas pains.  FOLLOW-UP  Your doctor will discuss the results of your test with you.  SEEK IMMEDIATE MEDICAL ATTENTION IF ANY OF THE FOLLOWING OCCUR:  Excessive nausea (feeling sick to your stomach) and/or vomiting.   Severe abdominal pain and distention (swelling).   Trouble swallowing.   Temperature over 101 F (37.8 C).   Rectal bleeding or vomiting of blood.    Further recommendations to follow pending review of pathology report  You may need to be evaluated and treated at the [facility]  - you may need to have your gastric polyps removed there and they may need to evaluate your small intestine.   Gastric Polyps Gastric polyps (also called stomach polyps) are growths on the inner lining of the stomach. They are clumps of cells that usually do not cause any symptoms. The polyps are often found when medical procedures are being performed for a different problem.  Gastric polyps are rare. However, several types of polyps can develop. Most are noncancerous (benign). Some are very small and do not require any treatment. Some are potentially harmful because of their size or location. Other types (adenomas) are known to cause complications and may become cancerous if left alone. Polyps that may become harmful are usually removed. Removal is often successful in preventing problems from developing. CAUSES  Gastric polyps form when inflammation or damage to the stomach lining occurs. This may happen because of:  Chronic stomach conditions, such as gastritis.  Use of certain medicines for reducing stomach acid (proton pump inhibitors).  An inherited condition called familial adenomatous polyposis. SYMPTOMS  Gastric polyps usually do not cause any symptoms. In rare cases, they may develop into ulcers or cause blockages. The following symptoms may occur:  Abdominal pain or tenderness.  Nausea.  Trouble eating or swallowing.  Blood in the stool.  Reduced number of red blood cells in the blood (anemia). DIAGNOSIS  Most often, gastric polyps are found when a medical procedure called endoscopy is being performed to look for some other problem in the stomach area. During endoscopy, a  thin tube with a tiny camera attached is placed down the throat and into the stomach. If polyps are discovered during endoscopy, your health care provider may:  Examine the polyps closely to determine the type of polyps.  Remove a tissue sample (biopsy) from the polyps for testing in a lab. A medical  history will then be taken to assess risks and the possibility of inherited disorders.  TREATMENT  Treatment will depend on the type, location, and size of the polyps found. Often, treatment is not necessary. In that case, your health care provider may choose to monitor the polyps by performing endoscopy periodically. Polyps that cause symptoms or are potentially harmful will often be removed during endoscopy. If you have a stomach infection or other gastric condition, you may be given medicines to treat the condition. In rare cases, surgery (partial gastrectomy) may be done to remove very large polyps. HOME CARE INSTRUCTIONS   Only take over-the-counter or prescription medicines as directed by your health care provider. Do not take any other medicines unless your health care provider approves.  If your health care provider prescribes antibiotic medicines, take them as directed. Finish them even if you start to feel better.  Follow up with your health care provider as directed. SEEK MEDICAL CARE IF:   You develop new or worsening symptoms. SEEK IMMEDIATE MEDICAL CARE IF:  You throw up blood.  You develop severe abdominal pain.  You cannot eat or drink.  You notice blood in your stool.   This information is not intended to replace advice given to you by your health care provider. Make sure you discuss any questions you have with your health care provider.   Document Released: 05/02/2012 Document Revised: 06/06/2014 Document Reviewed: 05/02/2012 Elsevier Interactive Patient Education [employer].

## 2015-10-12 NOTE — H&P (View-Only) (Signed)
Did we obtain copy of med list from Keith Young yet?

## 2015-10-12 NOTE — Anesthesia Postprocedure Evaluation (Signed)
Anesthesia Post Note  Patient: Keith Young  Procedure(s) Performed: Procedure(s) (LRB): ESOPHAGOGASTRODUODENOSCOPY (EGD) WITH PROPOFOL (N/A) BIOPSY  Patient location during evaluation: PACU Anesthesia Type: MAC Level of consciousness: awake and patient cooperative Pain management: pain level controlled Vital Signs Assessment: post-procedure vital signs reviewed and stable Respiratory status: spontaneous breathing, nonlabored ventilation and respiratory function stable Cardiovascular status: blood pressure returned to baseline and stable Postop Assessment: no signs of nausea or vomiting Anesthetic complications: no    Last Vitals:  Filed Vitals:   10/12/15 0910 10/12/15 0915  BP: 136/77 139/75  Temp:    Resp: 12 12    Last Pain: There were no vitals filed for this visit.               Gurnie Duris,Dmari J

## 2015-10-14 ENCOUNTER — Encounter (HOSPITAL_COMMUNITY): Payer: Self-pay | Admitting: Internal Medicine

## 2015-10-19 ENCOUNTER — Encounter: Payer: Self-pay | Admitting: Internal Medicine

## 2015-10-19 LAB — CUP PACEART REMOTE DEVICE CHECK
Brady Statistic RV Percent Paced: 0 %
Date Time Interrogation Session: 20170522163030
HighPow Impedance: 50 Ohm
Implantable Lead Implant Date: 20140617
Implantable Lead Location: 753862
Implantable Lead Serial Number: 310936
Lead Channel Sensing Intrinsic Amplitude: 4.7 mV
MDC IDC LEAD MODEL: 180
MDC IDC MSMT LEADCHNL RV IMPEDANCE VALUE: 434 Ohm
MDC IDC PG SERIAL: 436636
MDC IDC SET LEADCHNL RV PACING AMPLITUDE: 3 V
MDC IDC SET LEADCHNL RV PACING PULSEWIDTH: 1.2 ms
MDC IDC SET LEADCHNL RV SENSING SENSITIVITY: 0.6 mV

## 2015-10-20 ENCOUNTER — Encounter (HOSPITAL_COMMUNITY): Payer: Medicare HMO

## 2015-10-20 ENCOUNTER — Encounter: Payer: Self-pay | Admitting: Internal Medicine

## 2015-10-20 ENCOUNTER — Other Ambulatory Visit (HOSPITAL_COMMUNITY): Payer: Medicare HMO

## 2015-10-20 ENCOUNTER — Encounter (HOSPITAL_COMMUNITY): Payer: Self-pay | Admitting: Oncology

## 2015-10-20 ENCOUNTER — Encounter (HOSPITAL_BASED_OUTPATIENT_CLINIC_OR_DEPARTMENT_OTHER): Payer: Medicare HMO | Admitting: Oncology

## 2015-10-20 ENCOUNTER — Encounter (HOSPITAL_BASED_OUTPATIENT_CLINIC_OR_DEPARTMENT_OTHER): Payer: Medicare HMO

## 2015-10-20 VITALS — BP 92/57 | HR 73 | Temp 98.4°F | Resp 20 | Wt 120.1 lb

## 2015-10-20 DIAGNOSIS — D631 Anemia in chronic kidney disease: Secondary | ICD-10-CM

## 2015-10-20 DIAGNOSIS — N183 Chronic kidney disease, stage 3 (moderate): Secondary | ICD-10-CM

## 2015-10-20 DIAGNOSIS — D649 Anemia, unspecified: Secondary | ICD-10-CM | POA: Diagnosis not present

## 2015-10-20 DIAGNOSIS — N189 Chronic kidney disease, unspecified: Principal | ICD-10-CM

## 2015-10-20 LAB — CBC
HCT: 32.1 % — ABNORMAL LOW (ref 39.0–52.0)
Hemoglobin: 10 g/dL — ABNORMAL LOW (ref 13.0–17.0)
MCH: 28 pg (ref 26.0–34.0)
MCHC: 31.2 g/dL (ref 30.0–36.0)
MCV: 89.9 fL (ref 78.0–100.0)
Platelets: 142 10*3/uL — ABNORMAL LOW (ref 150–400)
RBC: 3.57 MIL/uL — AB (ref 4.22–5.81)
RDW: 15.3 % (ref 11.5–15.5)
WBC: 3.6 10*3/uL — AB (ref 4.0–10.5)

## 2015-10-20 MED ORDER — DARBEPOETIN ALFA 60 MCG/0.3ML IJ SOSY
50.0000 ug | PREFILLED_SYRINGE | Freq: Once | INTRAMUSCULAR | Status: AC
  Administered 2015-10-20: 50 ug via SUBCUTANEOUS
  Filled 2015-10-20: qty 0.3

## 2015-10-20 NOTE — Assessment & Plan Note (Signed)
Anemia of chronic renal disease, Stage III followed by Dr. Theador Hawthorne with an element of iron deficiency anemia having received IV iron in the past and an element of anemia of chronic disease in the setting of an elevated ESR.   Requiring ESA therapy with an excellent response.  Labs today: CBC  Aranesp injection today, 50 mcg.  Supportive therapy plan reviewed today.  Continue Aranesp 50 mcg every 2 weeks.  Labs every 2 weeks: CBC Labs every 90 days: Ferritin  Return in 6 weeks for follow-up.

## 2015-10-20 NOTE — Patient Instructions (Addendum)
[place] at [facility] Discharge Instructions  RECOMMENDATIONS MADE BY THE CONSULTANT AND ANY TEST RESULTS WILL BE SENT TO YOUR REFERRING PHYSICIAN.  Exam and discussion today with [name], PA. Lab work and Aranesp injection every 2 weeks. Return for office visit in 6 weeks as scheduled.   Thank you for choosing [place] at [facility] to provide your oncology and hematology care.  To afford each patient quality time with our provider, please arrive at least 15 minutes before your scheduled appointment time.   Beginning January 23rd 2017 lab work for the [facility] will be done in the  Main lab at [facility] on 1st floor. If you have a lab appointment with the [facility] please come in thru the  Main Entrance and check in at the main information desk  You need to re-schedule your appointment should you arrive 10 or more minutes late.  We strive to give you quality time with our providers, and arriving late affects you and other patients whose appointments are after yours.  Also, if you no show three or more times for appointments you may be dismissed from the clinic at the providers discretion.     Again, thank you for choosing [facility].  Our hope is that these requests will decrease the amount of time that you wait before being seen by our physicians.       _____________________________________________________________  Should you have questions after your visit to [facility], please contact our office at [phone] between the hours of 8:30 a.m. and 4:30 p.m.  Voicemails left after 4:30 p.m. will not be returned until the following business day.  For prescription refill requests, have your pharmacy contact our office.         Resources For Cancer Patients and their Caregivers ? American Cancer Society: Can assist with transportation, wigs, general needs, runs Look Good Feel Better.         (803)732-0503 ? Cancer Care: Provides financial assistance, online support groups, medication/co-pay assistance.  1-800-813-HOPE 769-271-6818) ? [facility] Assists [place] cancer patients and their families through emotional , educational and financial support.  (972) 420-7501 ? [place] DSS Where to apply for food stamps, Medicaid and utility assistance. 517-104-3721 ? [employer]: Transportation to medical appointments. 380-629-8712 ? Social Security Administration: May apply for disability if have a Stage IV cancer. 843-473-7399 (763)285-7680 ? [employer], Disability and Transit Services: Assists with nutrition, care and transit needs. [place] Support Programs: @10RELATIVEDAYS @ > Cancer Support Group  2nd Tuesday of the month 1pm-2pm, Journey Room  > Creative Journey  3rd Tuesday of the month 1130am-1pm, Journey Room  > Look Good Feel Better  1st Wednesday of the month 10am-12 noon, Journey Room (Call [facility] to register [phone])

## 2015-10-20 NOTE — Progress Notes (Signed)
Keith Young presents today for injection per MD orders. Aranesp 50 mcg administered SQ in left Abdomen. Administration without incident. Patient tolerated well.  

## 2015-10-20 NOTE — Patient Instructions (Signed)
[facility] at [facility] Discharge Instructions  RECOMMENDATIONS MADE BY THE CONSULTANT AND ANY TEST RESULTS WILL BE SENT TO YOUR REFERRING PHYSICIAN. Aranesp today.    Thank you for choosing [facility] at [facility] to provide your oncology and hematology care.  To afford each patient quality time with our provider, please arrive at least 15 minutes before your scheduled appointment time.   Beginning January 23rd 2017 lab work for the Cancer Center will be done in the  Main lab at [facility] on 1st floor. If you have a lab appointment with the Cancer Center please come in thru the  Main Entrance and check in at the main information desk  You need to re-schedule your appointment should you arrive 10 or more minutes late.  We strive to give you quality time with our providers, and arriving late affects you and other patients whose appointments are after yours.  Also, if you no show three or more times for appointments you may be dismissed from the clinic at the providers discretion.     Again, thank you for choosing [facility].  Our hope is that these requests will decrease the amount of time that you wait before being seen by our physicians.       _____________________________________________________________  Should you have questions after your visit to [facility], please contact our office at [phone] between the hours of 8:30 a.m. and 4:30 p.m.  Voicemails left after 4:30 p.m. will not be returned until the following business day.  For prescription refill requests, have your pharmacy contact our office.         Resources For Cancer Patients and their Caregivers ? American Cancer Society: Can assist with transportation, wigs, general needs, runs Look Good Feel Better.        1-888-227-6333 ? Cancer Care: Provides financial assistance, online support groups, medication/co-pay assistance.  1-800-813-HOPE  (4673) ? [facility] Assists [place] cancer patients and their families through emotional , educational and financial support.  336-427-4357 ? [place] DSS Where to apply for food stamps, Medicaid and utility assistance. 336-342-1394 ? RCATS: Transportation to medical appointments. 336-347-2287 ? Social Security Administration: May apply for disability if have a Stage IV cancer. 336-342-7796 1-800-772-1213 ? [place] [facility], Disability and Transit Services: Assists with nutrition, care and transit needs. 336-349-2343  Cancer Center Support Programs: @10RELATIVEDAYS@ > Cancer Support Group  2nd Tuesday of the month 1pm-2pm, Journey Room  > Creative Journey  3rd Tuesday of the month 1130am-1pm, Journey Room  > Look Good Feel Better  1st Wednesday of the month 10am-12 noon, Journey Room (Call American Cancer Society to register 1-800-395-5775)    

## 2015-10-20 NOTE — Progress Notes (Signed)
Keith Bogus, MD [place] [address]  Anemia in chronic kidney disease  CURRENT THERAPY: Aranesp 50 mcg every 2 weeks, beginning on 01/21/2015 with a period of noncompliance from 06/03/2015- 09/22/2015.  INTERVAL HISTORY: Keith Young 65 y.o. male returns for followup of Stage III chronic renal disease associated with anemia followed by Dr. Theador Hawthorne with an element of iron deficiency anemia and an element of anemia of chronic disease in the setting of an elevated ESR.   I personally reviewed and went over laboratory results with the patient.  The results are noted within this dictation.  HGB is responding nicely to ESA treatment.  Chart reviewed. He is S/P EGD by Dr. Gala Romney on 10/12/2015 which demonstrated gastritis without H. Pylori.  He denies any complaints.  Review of Systems  Constitutional: Negative.   HENT: Negative.  Negative for nosebleeds and sore throat.   Eyes: Negative.   Respiratory: Negative.  Negative for cough and hemoptysis.   Cardiovascular: Negative.   Gastrointestinal: Negative.  Negative for nausea, vomiting, abdominal pain, diarrhea, constipation, blood in stool and melena.  Genitourinary: Negative.  Negative for dysuria and hematuria.  Musculoskeletal: Negative.   Skin: Negative.   Neurological: Negative.   Endo/Heme/Allergies: Negative.   Psychiatric/Behavioral: Negative.     Past Medical History  Diagnosis Date  . Chronic systolic heart failure (HCC)     NYHA Class III  . Type 2 diabetes mellitus ([place])   . Nonischemic cardiomyopathy (HCC)     EF 15-20%  . Essential hypertension, benign   . CKD (chronic kidney disease) stage 3, GFR 30-59 ml/min   . Anemia   . Hyperlipidemia   . History of stroke   . Bell's palsy   . Cardiomyopathy ([place])   . Aortic insufficiency   . Alcoholism in remission ([place])   . Noncompliance with medication regimen   . NSVT (nonsustained ventricular tachycardia) ([place])   .  Cardiac defibrillator in situ   . COPD (chronic obstructive pulmonary disease) ([place])   . CVA (cerebral infarction) 01/2013  . Productive cough 08/2013    With brown sputum   . SOB (shortness of breath) on exertion 08/2013  . Numbness of right jaw     Had a stoke in 01/2013. Numbness is occasional, especially when trying to chew.  . At moderate risk for fall   . AICD (automatic cardioverter/defibrillator) present   . Stroke [facility])     weakness of right side from CVA  . Arthritis     Past Surgical History  Procedure Laterality Date  . Cardiac defibrillator placement  11/12/12    [employer] Inogen MINI ICD implanted in [place] at [facility]  . Cardiac surgery      bioprostetic AVR per Dr Nelly Laurence note  . Colonoscopy with propofol N/A 04/10/2014    RMR: Colonic Diverticulosis  . Esophagogastroduodenoscopy (egd) with propofol N/A 04/10/2014    RMR: Mild erosive reflux esophagitis. Multiple antral polyps likely hyperplastic status post removal by hot snare cautery technique. Diffusely abnormal stomach status post gastric biopsy I suspect some of patients anemaia may be due to intermittent oozing from the stomach. It would be difficult and a risky proposition to attempt complete removal of all of his gastric polyps.  . Polypectomy N/A 04/10/2014    Procedure: GASTRIC POLYPECTOMY;  Surgeon: Daneil Dolin, MD;  Location: [facility];  Service: Endoscopy;  Laterality: N/A;  . Biopsy N/A 04/10/2014  Procedure: GASTRIC BIOPSY;  Surgeon: Daneil Dolin, MD;  Location: [facility] ORS;  Service: Endoscopy;  Laterality: N/A;  . Right heart catheterization N/A 02/06/2014    Procedure: RIGHT HEART CATH;  Surgeon: Larey Dresser, MD;  Location: [facility] CATH LAB;  Service: Cardiovascular;  Laterality: N/A;  . Esophagogastroduodenoscopy (egd) with propofol N/A 10/12/2015    Procedure: ESOPHAGOGASTRODUODENOSCOPY (EGD) WITH PROPOFOL;  Surgeon: Daneil Dolin, MD;  Location: [facility] ENDO SUITE;  Service: Endoscopy;   Laterality: N/A;  9476 - moved to 9:15  . Biopsy  10/12/2015    Procedure: BIOPSY;  Surgeon: Daneil Dolin, MD;  Location: [facility] SUITE;  Service: Endoscopy;;  stomach bx's    Family History  Problem Relation Age of Onset  . Kidney disease Mother   . Kidney disease Sister   . Colon cancer Neg Hx     Social History   Social History  . Marital Status: Single    Spouse Name: N/A  . Number of Children: N/A  . Years of Education: N/A   Social History Main Topics  . Smoking status: Former Smoker    Types: Cigarettes    Quit date: 11/05/1993  . Smokeless tobacco: Never Used  . Alcohol Use: Yes     Comment: former heavy ETOH, none recently  . Drug Use: No     Comment: prior history of cocaine use, smoking   . Sexual Activity: Yes    Birth Control/ Protection: None   Other Topics Concern  . None   Social History Narrative   Recently moved from [place] to be with family in [place]                 PHYSICAL EXAMINATION  ECOG PERFORMANCE STATUS: 1 - Symptomatic but completely ambulatory  Filed Vitals:   10/20/15 1053  BP: 92/57  Pulse: 73  Temp: 98.4 F (36.9 C)  Resp: 20    GENERAL:alert, no distress, well nourished, well developed, comfortable, cooperative, smiling and accompanied by aid from group home facility. SKIN: skin color, texture, turgor are normal, no rashes or significant lesions HEAD: Normocephalic, No masses, lesions, tenderness or abnormalities EYES: normal, EOMI, Conjunctiva are pink and non-injected EARS: External ears normal OROPHARYNX:lips, buccal mucosa, and tongue normal and mucous membranes are moist  NECK: supple, trachea midline LYMPH:  not examined BREAST:not examined LUNGS: clear to auscultation  HEART: regular rate & rhythm, no murmurs, no gallops, S1 normal and S2 normal ABDOMEN:abdomen soft and normal bowel sounds BACK: Back symmetric, no curvature. EXTREMITIES:less then 2 second capillary refill, no joint deformities,  effusion, or inflammation, no skin discoloration, no cyanosis  NEURO: alert & oriented x 3 with fluent speech, no focal motor/sensory deficits, gait normal   LABORATORY DATA: CBC    Component Value Date/Time   WBC 3.6* 10/20/2015 0944   RBC 3.57* 10/20/2015 0944   RBC 3.04* 10/29/2014 1610   HGB 10.0* 10/20/2015 0944   HCT 32.1* 10/20/2015 0944   PLT 142* 10/20/2015 0944   MCV 89.9 10/20/2015 0944   MCH 28.0 10/20/2015 0944   MCHC 31.2 10/20/2015 0944   RDW 15.3 10/20/2015 0944   LYMPHSABS 1.1 04/28/2015 1628   MONOABS 0.4 04/28/2015 1628   EOSABS 0.0 04/28/2015 1628   BASOSABS 0.0 04/28/2015 1628      Chemistry      Component Value Date/Time   NA 134* 09/22/2015 1128   K 4.4 09/22/2015 1128   CL 104 09/22/2015 1128   CO2 24 09/22/2015 1128  BUN 43* 09/22/2015 1128   CREATININE 1.88* 09/22/2015 1128   CREATININE 2.33* 07/09/2014 1145      Component Value Date/Time   CALCIUM 9.3 09/22/2015 1128   CALCIUM 9.2 03/11/2015 1428   ALKPHOS 82 09/22/2015 1128   AST 39 09/22/2015 1128   ALT 28 09/22/2015 1128   BILITOT 0.4 09/22/2015 1128        PENDING LABS:   RADIOGRAPHIC STUDIES:  Ct Head Wo Contrast  09/22/2015  CLINICAL DATA:  Altered mental status for several weeks EXAM: CT HEAD WITHOUT CONTRAST TECHNIQUE: Contiguous axial images were obtained from the base of the skull through the vertex without intravenous contrast. COMPARISON:  04/28/2015 FINDINGS: The bony calvarium is intact. No gross soft tissue abnormality is noted. Atrophic changes and chronic white matter ischemic changes are seen and stable. Changes of prior infarct are noted in the right posterior parietal region. Small lacunar infarcts are noted within the thalami, right pons and basal ganglia. No findings to suggest acute hemorrhage, acute infarction or space-occupying mass lesion is seen. IMPRESSION: Chronic changes without acute abnormality. Electronically Signed   By: Inez Catalina M.D.   On:  09/22/2015 13:43     PATHOLOGY:    ASSESSMENT AND PLAN:  Anemia in chronic kidney disease Anemia of chronic renal disease, Stage III followed by Dr. Theador Hawthorne with an element of iron deficiency anemia having received IV iron in the past and an element of anemia of chronic disease in the setting of an elevated ESR.   Requiring ESA therapy with an excellent response.  Labs today: CBC  Aranesp injection today, 50 mcg.  Supportive therapy plan reviewed today.  Continue Aranesp 50 mcg every 2 weeks.  Labs every 2 weeks: CBC Labs every 90 days: Ferritin  Return in 6 weeks for follow-up.    ORDERS PLACED FOR THIS ENCOUNTER: No orders of the defined types were placed in this encounter.    MEDICATIONS PRESCRIBED THIS ENCOUNTER: No orders of the defined types were placed in this encounter.    THERAPY PLAN:  Continue supportive therapy plan as outlined above.  All questions were answered. The patient knows to call the clinic with any problems, questions or concerns. We can certainly see the patient much sooner if necessary.  Patient and plan discussed with Dr. Ancil Linsey and she is in agreement with the aforementioned.   This note is electronically signed by: Doy Mince 10/20/2015 5:08 PM

## 2015-10-22 ENCOUNTER — Encounter: Payer: Self-pay | Admitting: Cardiology

## 2015-10-27 ENCOUNTER — Ambulatory Visit (INDEPENDENT_AMBULATORY_CARE_PROVIDER_SITE_OTHER): Payer: Medicare HMO | Admitting: *Deleted

## 2015-10-27 DIAGNOSIS — I429 Cardiomyopathy, unspecified: Secondary | ICD-10-CM | POA: Diagnosis not present

## 2015-10-28 NOTE — Progress Notes (Signed)
Remote ICD transmission.   

## 2015-10-29 ENCOUNTER — Other Ambulatory Visit: Payer: Self-pay | Admitting: Internal Medicine

## 2015-11-03 ENCOUNTER — Encounter (HOSPITAL_COMMUNITY): Payer: Medicare HMO | Attending: Hematology & Oncology

## 2015-11-03 ENCOUNTER — Other Ambulatory Visit (HOSPITAL_COMMUNITY)
Admission: RE | Admit: 2015-11-03 | Discharge: 2015-11-03 | Disposition: A | Discharge status: Home / Self Care | Payer: Medicare HMO | Source: Ambulatory Visit | Attending: Pulmonary Disease | Admitting: Pulmonary Disease

## 2015-11-03 VITALS — BP 102/54 | HR 67 | Temp 98.0°F | Resp 20

## 2015-11-03 DIAGNOSIS — I129 Hypertensive chronic kidney disease with stage 1 through stage 4 chronic kidney disease, or unspecified chronic kidney disease: Secondary | ICD-10-CM | POA: Insufficient documentation

## 2015-11-03 DIAGNOSIS — D631 Anemia in chronic kidney disease: Secondary | ICD-10-CM

## 2015-11-03 DIAGNOSIS — N189 Chronic kidney disease, unspecified: Secondary | ICD-10-CM

## 2015-11-03 DIAGNOSIS — N183 Chronic kidney disease, stage 3 (moderate): Secondary | ICD-10-CM

## 2015-11-03 DIAGNOSIS — I5022 Chronic systolic (congestive) heart failure: Secondary | ICD-10-CM | POA: Insufficient documentation

## 2015-11-03 DIAGNOSIS — D649 Anemia, unspecified: Secondary | ICD-10-CM | POA: Insufficient documentation

## 2015-11-03 DIAGNOSIS — E119 Type 2 diabetes mellitus without complications: Secondary | ICD-10-CM | POA: Diagnosis present

## 2015-11-03 DIAGNOSIS — I10 Essential (primary) hypertension: Secondary | ICD-10-CM | POA: Diagnosis present

## 2015-11-03 LAB — CBC
HEMATOCRIT: 31.9 % — AB (ref 39.0–52.0)
Hemoglobin: 10 g/dL — ABNORMAL LOW (ref 13.0–17.0)
MCH: 28.4 pg (ref 26.0–34.0)
MCHC: 31.3 g/dL (ref 30.0–36.0)
MCV: 90.6 fL (ref 78.0–100.0)
PLATELETS: 161 10*3/uL (ref 150–400)
RBC: 3.52 MIL/uL — AB (ref 4.22–5.81)
RDW: 15.5 % (ref 11.5–15.5)
WBC: 2.7 10*3/uL — AB (ref 4.0–10.5)

## 2015-11-03 MED ORDER — DARBEPOETIN ALFA 60 MCG/0.3ML IJ SOSY
50.0000 ug | PREFILLED_SYRINGE | Freq: Once | INTRAMUSCULAR | Status: AC
  Administered 2015-11-03: 50 ug via SUBCUTANEOUS

## 2015-11-03 MED ORDER — DARBEPOETIN ALFA 60 MCG/0.3ML IJ SOSY
PREFILLED_SYRINGE | INTRAMUSCULAR | Status: AC
  Filled 2015-11-03: qty 0.3

## 2015-11-03 NOTE — Progress Notes (Signed)
Keith Young presents today for injection per MD orders. Aranesp 50 mcg administered SQ in right Abdomen. Administration without incident. Patient tolerated well.  

## 2015-11-03 NOTE — Patient Instructions (Signed)
[facility] at [facility] Discharge Instructions  RECOMMENDATIONS MADE BY THE CONSULTANT AND ANY TEST RESULTS WILL BE SENT TO YOUR REFERRING PHYSICIAN. Aranesp today.    Thank you for choosing [facility] at [facility] to provide your oncology and hematology care.  To afford each patient quality time with our provider, please arrive at least 15 minutes before your scheduled appointment time.   Beginning January 23rd 2017 lab work for the Cancer Center will be done in the  Main lab at [facility] on 1st floor. If you have a lab appointment with the Cancer Center please come in thru the  Main Entrance and check in at the main information desk  You need to re-schedule your appointment should you arrive 10 or more minutes late.  We strive to give you quality time with our providers, and arriving late affects you and other patients whose appointments are after yours.  Also, if you no show three or more times for appointments you may be dismissed from the clinic at the providers discretion.     Again, thank you for choosing [facility].  Our hope is that these requests will decrease the amount of time that you wait before being seen by our physicians.       _____________________________________________________________  Should you have questions after your visit to  Cancer Center, please contact our office at [phone] between the hours of 8:30 a.m. and 4:30 p.m.  Voicemails left after 4:30 p.m. will not be returned until the following business day.  For prescription refill requests, have your pharmacy contact our office.         Resources For Cancer Patients and their Caregivers ? American Cancer Society: Can assist with transportation, wigs, general needs, runs Look Good Feel Better.        1-888-227-6333 ? Cancer Care: Provides financial assistance, online support groups, medication/co-pay assistance.  1-800-813-HOPE  (4673) ? [facility] Assists [place] cancer patients and their families through emotional , educational and financial support.  336-427-4357 ? [place] DSS Where to apply for food stamps, Medicaid and utility assistance. 336-342-1394 ? RCATS: Transportation to medical appointments. 336-347-2287 ? Social Security Administration: May apply for disability if have a Stage IV cancer. 336-342-7796 1-800-772-1213 ? [place] Aging, Disability and Transit Services: Assists with nutrition, care and transit needs. 336-349-2343  Cancer Center Support Programs: @10RELATIVEDAYS@ > Cancer Support Group  2nd Tuesday of the month 1pm-2pm, Journey Room  > Creative Journey  3rd Tuesday of the month 1130am-1pm, Journey Room  > Look Good Feel Better  1st Wednesday of the month 10am-12 noon, Journey Room (Call American [employer] to register 1-800-395-5775)    

## 2015-11-04 LAB — HEMOGLOBIN A1C
HEMOGLOBIN A1C: 5.4 % (ref 4.8–5.6)
Mean Plasma Glucose: 108 mg/dL

## 2015-11-12 ENCOUNTER — Telehealth: Payer: Self-pay | Admitting: Internal Medicine

## 2015-11-12 LAB — CUP PACEART REMOTE DEVICE CHECK
Battery Remaining Percentage: 100 %
Brady Statistic RV Percent Paced: 0 %
HIGH POWER IMPEDANCE MEASURED VALUE: 58 Ohm
Implantable Lead Model: 180
Lead Channel Impedance Value: 464 Ohm
Lead Channel Pacing Threshold Amplitude: 3 V
Lead Channel Setting Pacing Amplitude: 3 V
Lead Channel Setting Pacing Pulse Width: 1.2 ms
Lead Channel Setting Sensing Sensitivity: 0.6 mV
MDC IDC LEAD IMPLANT DT: 20140617
MDC IDC LEAD LOCATION: 753862
MDC IDC LEAD SERIAL: 310936
MDC IDC MSMT BATTERY REMAINING LONGEVITY: 60 mo
MDC IDC MSMT LEADCHNL RV PACING THRESHOLD PULSEWIDTH: 0.3 ms
MDC IDC SESS DTM: 20170601130600
Pulse Gen Serial Number: 436636

## 2015-11-12 NOTE — Telephone Encounter (Signed)
Jeronimo Greaves (caregiver) called asking for LSL. I told him that she was rounding at the hospital and I could take a message. He then asked who Milas Gain was and I told him that she was our receptionist and did he need to speak with her. He said that patient had been scheduled for 7/10 and wanted to know why. I told him it said it was for a follow up for his anemia. He said that patient was seeing Dr Whitney Muse for that and we needed to get on the same page. I told him the OV was made per RMR recommendations.  Mr [name] still insisted that we needed to consult with Dr Whitney Muse and see if patient needed this OV or not. I told him that I would send a message to nurse to advise and I would not cancel OV until I was told different. He agreed. Please advise [phone]

## 2015-11-17 ENCOUNTER — Encounter (HOSPITAL_COMMUNITY): Payer: Medicare HMO

## 2015-11-17 ENCOUNTER — Encounter: Payer: Self-pay | Admitting: Cardiology

## 2015-11-17 ENCOUNTER — Other Ambulatory Visit (HOSPITAL_COMMUNITY): Payer: Medicare HMO

## 2015-11-18 ENCOUNTER — Encounter: Payer: Self-pay | Admitting: Gastroenterology

## 2015-11-18 NOTE — Telephone Encounter (Signed)
I would suggest GI follow a little further out as patient is actively seeing hematology for anemia, receiving injections.  Return here in 12/2015, if hemoglobin is not improved, then he may need further small bowel imaging vis CTE/MRE as he is not a candidate for capsule study.

## 2015-11-18 NOTE — Telephone Encounter (Signed)
Keith Young, can you change pts ov?

## 2015-11-18 NOTE — Telephone Encounter (Signed)
Cancel OV and rescheduled to August. Letter mailed to patient

## 2015-11-18 NOTE — Telephone Encounter (Signed)
Routing to LSL- does pt need to follow up here?

## 2015-11-19 ENCOUNTER — Encounter (HOSPITAL_COMMUNITY): Payer: Self-pay

## 2015-11-19 ENCOUNTER — Encounter (HOSPITAL_COMMUNITY): Payer: Medicare HMO

## 2015-11-19 ENCOUNTER — Encounter (HOSPITAL_BASED_OUTPATIENT_CLINIC_OR_DEPARTMENT_OTHER): Payer: Medicare HMO

## 2015-11-19 VITALS — BP 125/68 | HR 72 | Resp 18

## 2015-11-19 DIAGNOSIS — D631 Anemia in chronic kidney disease: Secondary | ICD-10-CM | POA: Diagnosis not present

## 2015-11-19 DIAGNOSIS — N189 Chronic kidney disease, unspecified: Principal | ICD-10-CM

## 2015-11-19 DIAGNOSIS — N183 Chronic kidney disease, stage 3 (moderate): Secondary | ICD-10-CM | POA: Diagnosis not present

## 2015-11-19 DIAGNOSIS — D649 Anemia, unspecified: Secondary | ICD-10-CM | POA: Diagnosis not present

## 2015-11-19 LAB — CBC
HEMATOCRIT: 32.4 % — AB (ref 39.0–52.0)
HEMOGLOBIN: 10.4 g/dL — AB (ref 13.0–17.0)
MCH: 29.1 pg (ref 26.0–34.0)
MCHC: 32.1 g/dL (ref 30.0–36.0)
MCV: 90.8 fL (ref 78.0–100.0)
Platelets: 114 10*3/uL — ABNORMAL LOW (ref 150–400)
RBC: 3.57 MIL/uL — ABNORMAL LOW (ref 4.22–5.81)
RDW: 15.2 % (ref 11.5–15.5)
WBC: 2.8 10*3/uL — AB (ref 4.0–10.5)

## 2015-11-19 MED ORDER — DARBEPOETIN ALFA 60 MCG/0.3ML IJ SOSY
50.0000 ug | PREFILLED_SYRINGE | Freq: Once | INTRAMUSCULAR | Status: AC
  Administered 2015-11-19: 50 ug via SUBCUTANEOUS
  Filled 2015-11-19: qty 0.3

## 2015-11-19 NOTE — Patient Instructions (Signed)
[place] at [facility] Discharge Instructions  RECOMMENDATIONS MADE BY THE CONSULTANT AND ANY TEST RESULTS WILL BE SENT TO YOUR REFERRING PHYSICIAN.  Aranesp 54mcg given to you today. Follow up as scheduled.  Thank you for choosing [place] at [facility] to provide your oncology and hematology care.  To afford each patient quality time with our provider, please arrive at least 15 minutes before your scheduled appointment time.   Beginning January 23rd 2017 lab work for the [facility] will be done in the  Main lab at [facility] on 1st floor. If you have a lab appointment with the [place] please come in thru the  Main Entrance and check in at the main information desk  You need to re-schedule your appointment should you arrive 10 or more minutes late.  We strive to give you quality time with our providers, and arriving late affects you and other patients whose appointments are after yours.  Also, if you no show three or more times for appointments you may be dismissed from the clinic at the providers discretion.     Again, thank you for choosing [facility].  Our hope is that these requests will decrease the amount of time that you wait before being seen by our physicians.       _____________________________________________________________  Should you have questions after your visit to [facility], please contact our office at [phone] between the hours of 8:30 a.m. and 4:30 p.m.  Voicemails left after 4:30 p.m. will not be returned until the following business day.  For prescription refill requests, have your pharmacy contact our office.         Resources For Cancer Patients and their Caregivers ? American Cancer Society: Can assist with transportation, wigs, general needs, runs Look Good Feel Better.        909-378-1264 ? Cancer Care: Provides financial assistance, online support groups,  medication/co-pay assistance.  1-800-813-HOPE (409) 878-2748) ? [facility] Assists [place] cancer patients and their families through emotional , educational and financial support.  (332)173-8189 ? [place] DSS Where to apply for food stamps, Medicaid and utility assistance. (253)512-5266 ? RCATS: Transportation to medical appointments. 762 307 5645 ? Social Security Administration: May apply for disability if have a Stage IV cancer. 225-542-5737 443-482-1733 ? [employer], Disability and Transit Services: Assists with nutrition, care and transit needs. [place] Support Programs: @10RELATIVEDAYS @ > Cancer Support Group  2nd Tuesday of the month 1pm-2pm, Journey Room  > Creative Journey  3rd Tuesday of the month 1130am-1pm, Journey Room  > Look Good Feel Better  1st Wednesday of the month 10am-12 noon, Journey Room (Call [place] to register [phone])

## 2015-11-19 NOTE — Progress Notes (Signed)
Keith Young presents today for injection per MD orders. Aranesp 50 mcg administered SQ in right Abdomen. Administration without incident. Patient tolerated well.  

## 2015-11-28 ENCOUNTER — Emergency Department (HOSPITAL_COMMUNITY)
Admission: EM | Admit: 2015-11-28 | Discharge: 2015-11-28 | Disposition: A | Discharge status: Home / Self Care | Payer: Medicare HMO | Attending: Emergency Medicine | Admitting: Emergency Medicine

## 2015-11-28 ENCOUNTER — Encounter (HOSPITAL_COMMUNITY): Payer: Self-pay | Admitting: Emergency Medicine

## 2015-11-28 DIAGNOSIS — Z794 Long term (current) use of insulin: Secondary | ICD-10-CM | POA: Diagnosis not present

## 2015-11-28 DIAGNOSIS — Z79899 Other long term (current) drug therapy: Secondary | ICD-10-CM | POA: Diagnosis not present

## 2015-11-28 DIAGNOSIS — Z8673 Personal history of transient ischemic attack (TIA), and cerebral infarction without residual deficits: Secondary | ICD-10-CM | POA: Diagnosis not present

## 2015-11-28 DIAGNOSIS — E1122 Type 2 diabetes mellitus with diabetic chronic kidney disease: Secondary | ICD-10-CM | POA: Insufficient documentation

## 2015-11-28 DIAGNOSIS — J449 Chronic obstructive pulmonary disease, unspecified: Secondary | ICD-10-CM | POA: Diagnosis not present

## 2015-11-28 DIAGNOSIS — R635 Abnormal weight gain: Secondary | ICD-10-CM | POA: Diagnosis not present

## 2015-11-28 DIAGNOSIS — I5022 Chronic systolic (congestive) heart failure: Secondary | ICD-10-CM | POA: Diagnosis not present

## 2015-11-28 DIAGNOSIS — M1990 Unspecified osteoarthritis, unspecified site: Secondary | ICD-10-CM | POA: Insufficient documentation

## 2015-11-28 DIAGNOSIS — Z7982 Long term (current) use of aspirin: Secondary | ICD-10-CM | POA: Insufficient documentation

## 2015-11-28 DIAGNOSIS — Z87891 Personal history of nicotine dependence: Secondary | ICD-10-CM | POA: Insufficient documentation

## 2015-11-28 DIAGNOSIS — I130 Hypertensive heart and chronic kidney disease with heart failure and stage 1 through stage 4 chronic kidney disease, or unspecified chronic kidney disease: Secondary | ICD-10-CM | POA: Insufficient documentation

## 2015-11-28 DIAGNOSIS — E785 Hyperlipidemia, unspecified: Secondary | ICD-10-CM | POA: Insufficient documentation

## 2015-11-28 DIAGNOSIS — N183 Chronic kidney disease, stage 3 (moderate): Secondary | ICD-10-CM | POA: Diagnosis not present

## 2015-11-28 DIAGNOSIS — R69 Illness, unspecified: Secondary | ICD-10-CM | POA: Diagnosis present

## 2015-11-28 LAB — CBC WITH DIFFERENTIAL/PLATELET
BASOS ABS: 0 10*3/uL (ref 0.0–0.1)
BASOS PCT: 0 %
Eosinophils Absolute: 0.1 10*3/uL (ref 0.0–0.7)
Eosinophils Relative: 2 %
HEMATOCRIT: 34.7 % — AB (ref 39.0–52.0)
HEMOGLOBIN: 10.8 g/dL — AB (ref 13.0–17.0)
LYMPHS PCT: 43 %
Lymphs Abs: 1 10*3/uL (ref 0.7–4.0)
MCH: 28.9 pg (ref 26.0–34.0)
MCHC: 31.1 g/dL (ref 30.0–36.0)
MCV: 92.8 fL (ref 78.0–100.0)
Monocytes Absolute: 0.3 10*3/uL (ref 0.1–1.0)
Monocytes Relative: 13 %
NEUTROS ABS: 1 10*3/uL — AB (ref 1.7–7.7)
NEUTROS PCT: 42 %
Platelets: 122 10*3/uL — ABNORMAL LOW (ref 150–400)
RBC: 3.74 MIL/uL — ABNORMAL LOW (ref 4.22–5.81)
RDW: 16.1 % — ABNORMAL HIGH (ref 11.5–15.5)
WBC: 2.5 10*3/uL — ABNORMAL LOW (ref 4.0–10.5)

## 2015-11-28 LAB — I-STAT CHEM 8, ED
BUN: 25 mg/dL — AB (ref 6–20)
CREATININE: 1.3 mg/dL — AB (ref 0.61–1.24)
Calcium, Ion: 1.27 mmol/L — ABNORMAL HIGH (ref 1.12–1.23)
Chloride: 106 mmol/L (ref 101–111)
Glucose, Bld: 116 mg/dL — ABNORMAL HIGH (ref 65–99)
HEMATOCRIT: 36 % — AB (ref 39.0–52.0)
HEMOGLOBIN: 12.2 g/dL — AB (ref 13.0–17.0)
POTASSIUM: 4 mmol/L (ref 3.5–5.1)
SODIUM: 142 mmol/L (ref 135–145)
TCO2: 25 mmol/L (ref 0–100)

## 2015-11-28 NOTE — ED Notes (Signed)
Patient ambulatory to restroom, caregiver with patient.

## 2015-11-28 NOTE — ED Notes (Signed)
Pt brought in from group home for evaluation of 18lb weight gain over a week.  No edema noted.  Pt denies complaints.

## 2015-11-28 NOTE — ED Notes (Signed)
Patient here by caregiver from group home. Care giver states patient has gained weight over the past two weeks. States patient has increased his PO intake and has "been eating real good" per caregiver. Patient denies any pain or shortness of breath, denies cough, denies GI symptoms or GU symptoms. Caregiver states they have not contacted PCP for evaluation of this. Caregiver states patient previously walked with a walker, but now is able to ambulate without assistive devices. Patient is A&OX4 at this time. NAD noted. Ambulatory from triage without assistance

## 2015-11-28 NOTE — Discharge Instructions (Signed)
The weight gain appears to be appropriate this time, however watch for signs of volume overload. Follow-up with Dr. Luan Pulling as needed.

## 2015-11-28 NOTE — ED Provider Notes (Signed)
CSN: [id number]     Arrival date & time 11/28/15  1203 History   First MD Initiated Contact with Patient 11/28/15 1228     Chief Complaint  Patient presents with  . Illness      Patient is a 65 y.o. male presenting with general illness. The history is provided by the patient.  Illness Associated symptoms: no abdominal pain, no chest pain, no diarrhea, no headaches, no nausea, no rash, no shortness of breath and no vomiting    patient presents from his group home with weight gain. He is without complaints. Reported he is put on around 14 pounds in the last 2 weeks. Has had a good appetite and has been eating better. Has been walking well.No chest pain or trouble breathing. No swelling in his legs. No abdominal pain. History of cardiomyopathy. History of heart failure. History of renal insufficiency and anemia related to it.  Past Medical History  Diagnosis Date  . Chronic systolic heart failure (HCC)     NYHA Class III  . Type 2 diabetes mellitus ([place])   . Nonischemic cardiomyopathy (HCC)     EF 15-20%  . Essential hypertension, benign   . CKD (chronic kidney disease) stage 3, GFR 30-59 ml/min   . Anemia   . Hyperlipidemia   . History of stroke   . Bell's palsy   . Cardiomyopathy ([place])   . Aortic insufficiency   . Alcoholism in remission ([place])   . Noncompliance with medication regimen   . NSVT (nonsustained ventricular tachycardia) ([place])   . Cardiac defibrillator in situ   . COPD (chronic obstructive pulmonary disease) ([place])   . CVA (cerebral infarction) 01/2013  . Productive cough 08/2013    With brown sputum   . SOB (shortness of breath) on exertion 08/2013  . Numbness of right jaw     Had a stoke in 01/2013. Numbness is occasional, especially when trying to chew.  . At moderate risk for fall   . AICD (automatic cardioverter/defibrillator) present   . Stroke [facility])     weakness of right side from CVA  . Arthritis    Past Surgical History  Procedure Laterality Date  .  Cardiac defibrillator placement  11/12/12    [employer] Inogen MINI ICD implanted in [place] at [facility]  . Cardiac surgery      bioprostetic AVR per Dr Nelly Laurence note  . Colonoscopy with propofol N/A 04/10/2014    RMR: Colonic Diverticulosis  . Esophagogastroduodenoscopy (egd) with propofol N/A 04/10/2014    RMR: Mild erosive reflux esophagitis. Multiple antral polyps likely hyperplastic status post removal by hot snare cautery technique. Diffusely abnormal stomach status post gastric biopsy I suspect some of patients anemaia may be due to intermittent oozing from the stomach. It would be difficult and a risky proposition to attempt complete removal of all of his gastric polyps.  . Polypectomy N/A 04/10/2014    Procedure: GASTRIC POLYPECTOMY;  Surgeon: Daneil Dolin, MD;  Location: [facility] OR[facility];  Service: Endoscopy;  Laterality: N/A;  . Biopsy N/A 04/10/2014    Procedure: GASTRIC BIOPSY;  Surgeon: Daneil Dolin, MD;  Location: [facility] ORS;  Service: Endoscopy;  Laterality: N/A;  . Right heart catheterization N/A 02/06/2014    Procedure: RIGHT HEART CATH;  Surgeon: Larey Dresser, MD;  Location: [facility] CATH LAB;  Service: Cardiovascular;  Laterality: N/A;  . Esophagogastroduodenoscopy (egd) with propofol N/A 10/12/2015    Procedure: ESOPHAGOGASTRODUODENOSCOPY (EGD) WITH PROPOFOL;  Surgeon: Daneil Dolin, MD;  Location: [facility] ENDO SUITE;  Service: Endoscopy;  Laterality: N/A;  [id number] - moved to 9:15  . Biopsy  10/12/2015    Procedure: BIOPSY;  Surgeon: Daneil Dolin, MD;  Location: [facility] ENDO SU[facility];  Service: Endoscopy;;  stomach bx's   Family History  Problem Relation Age of Onset  . Kidney disease Mother   . Kidney disease Sister   . Colon cancer Neg Hx    Social History  Substance Use Topics  . Smoking status: Former Smoker    Types: Cigarettes    Quit date: 11/05/1993  . Smokeless tobacco: Never Used  . Alcohol Use: Yes     Comment: former heavy ETOH, none recently    Review of  Systems  Constitutional: Positive for appetite change. Negative for activity change.  Eyes: Negative for pain.  Respiratory: Negative for chest tightness and shortness of breath.   Cardiovascular: Negative for chest pain and leg swelling.  Gastrointestinal: Negative for nausea, vomiting, abdominal pain and diarrhea.  Genitourinary: Negative for flank pain.  Musculoskeletal: Negative for back pain and neck stiffness.  Skin: Negative for rash.  Neurological: Negative for weakness, numbness and headaches.  Psychiatric/Behavioral: Negative for behavioral problems.      Allergies  Review of patient's allergies indicates no known allergies.  Home Medications   Prior to Admission medications   Medication Sig Start Date End Date Taking? Authorizing Provider  aspirin 81 MG EC tablet Take 1 tablet (81 mg total) by mouth daily. 12/17/13  Yes Larey Dresser, MD  atorvastatin (LIPITOR) 80 MG tablet Take 80 mg by mouth daily.    Yes Historical Provider, MD  carvedilol (COREG) 25 MG tablet Take 12.5 mg by mouth 2 (two) times daily with a meal.    Yes Historical Provider, MD  Cholecalciferol (VITAMIN D3) 2000 UNITS TABS Take 2,000 mg by mouth daily.   Yes Historical Provider, MD  digoxin (LANOXIN) 0.125 MG tablet Take 0.5 tablets by mouth daily.  09/29/15  Yes Historical Provider, MD  ferrous sulfate 325 (65 FE) MG tablet Take 325 mg by mouth daily with breakfast.   Yes Historical Provider, MD  hydrALAZINE (APRESOLINE) 25 MG tablet Take 50 mg by mouth 3 (three) times daily.  09/29/15  Yes Historical Provider, MD  isosorbide dinitrate (ISORDIL) 20 MG tablet Take 20 mg by mouth 3 (three) times daily.  09/29/15  Yes Historical Provider, MD  mirabegron ER (MYRBETRIQ) 25 MG TB24 tablet Take 25 mg by mouth daily.   Yes Historical Provider, MD  NOVOLOG FLEXPEN 100 UNIT/ML FlexPen Up to 28 units daily sliding scale 09/02/15  Yes Historical Provider, MD  pantoprazole (PROTONIX) 40 MG tablet Take 1 tablet (40 mg  total) by mouth daily. 07/14/15  Yes Mahala Menghini, PA-C  traMADol (ULTRAM) 50 MG tablet Take 50 mg by mouth every 6 (six) hours as needed for moderate pain.  08/05/14  Yes Historical Provider, MD   BP 153/71 mmHg  Pulse 66  Temp(Src) 98.1 F (36.7 C) (Oral)  Resp 18  Ht 5\' 2"  (1.575 m)  Wt 133 lb 6.4 oz (60.51 kg)  BMI 24.39 kg/m2  SpO2 100% Physical Exam  Constitutional: He appears well-developed.  HENT:  Head: Atraumatic.  Cardiovascular: Normal rate.   Pulmonary/Chest: Effort normal.  Abdominal: Soft. There is no tenderness.  Neurological: He is alert.  Skin: Skin is warm. No erythema.    ED Course  Procedures (including critical care time) Labs Review Labs Reviewed - No data to display  Imaging  Review No results found. I have personally reviewed and evaluated these images and lab results as part of my medical decision-making.   EKG Interpretation None      MDM   Final diagnoses:  None     patient presented with weight gain. Clinically his been doing better. Now reportedly his been walking without his walker. No peripheral edema no JVD and lungs are clear. Does have history of cardiomyopathy this does not appear to be clinically volume overloaded. CBC and basic metabolic are near baseline. This time weight gain is probably appropriate. Will need to be monitored for volume overload. Will discharge home.    Davonna Belling, MD 11/28/15 1349

## 2015-12-02 ENCOUNTER — Ambulatory Visit (HOSPITAL_COMMUNITY): Payer: Medicare HMO

## 2015-12-02 ENCOUNTER — Ambulatory Visit (HOSPITAL_COMMUNITY): Payer: Medicare HMO | Admitting: Hematology & Oncology

## 2015-12-02 ENCOUNTER — Other Ambulatory Visit (HOSPITAL_COMMUNITY): Payer: Medicare HMO

## 2015-12-07 ENCOUNTER — Ambulatory Visit: Payer: Medicare HMO | Admitting: Gastroenterology

## 2015-12-17 ENCOUNTER — Encounter (HOSPITAL_COMMUNITY): Payer: Self-pay | Admitting: Oncology

## 2015-12-17 ENCOUNTER — Encounter (HOSPITAL_COMMUNITY): Payer: Medicare HMO | Attending: Oncology | Admitting: Oncology

## 2015-12-17 ENCOUNTER — Encounter (HOSPITAL_COMMUNITY): Payer: Medicare HMO | Attending: Hematology & Oncology

## 2015-12-17 VITALS — BP 131/65 | HR 65 | Temp 98.1°F | Resp 16 | Wt 135.1 lb

## 2015-12-17 DIAGNOSIS — D631 Anemia in chronic kidney disease: Secondary | ICD-10-CM

## 2015-12-17 DIAGNOSIS — D649 Anemia, unspecified: Secondary | ICD-10-CM | POA: Diagnosis present

## 2015-12-17 DIAGNOSIS — N189 Chronic kidney disease, unspecified: Secondary | ICD-10-CM | POA: Diagnosis not present

## 2015-12-17 LAB — CBC
HEMATOCRIT: 31.3 % — AB (ref 39.0–52.0)
HEMOGLOBIN: 10 g/dL — AB (ref 13.0–17.0)
MCH: 29 pg (ref 26.0–34.0)
MCHC: 31.9 g/dL (ref 30.0–36.0)
MCV: 90.7 fL (ref 78.0–100.0)
Platelets: 110 10*3/uL — ABNORMAL LOW (ref 150–400)
RBC: 3.45 MIL/uL — AB (ref 4.22–5.81)
RDW: 15.1 % (ref 11.5–15.5)
WBC: 2.3 10*3/uL — ABNORMAL LOW (ref 4.0–10.5)

## 2015-12-17 LAB — FERRITIN: Ferritin: 231 ng/mL (ref 24–336)

## 2015-12-17 MED ORDER — DARBEPOETIN ALFA 60 MCG/0.3ML IJ SOSY
50.0000 ug | PREFILLED_SYRINGE | Freq: Once | INTRAMUSCULAR | Status: AC
  Administered 2015-12-17: 50 ug via SUBCUTANEOUS
  Filled 2015-12-17: qty 0.3

## 2015-12-17 NOTE — Patient Instructions (Signed)
[place] at [facility] Discharge Instructions  RECOMMENDATIONS MADE BY THE CONSULTANT AND ANY TEST RESULTS WILL BE SENT TO YOUR REFERRING PHYSICIAN.  You saw Dr. Gershon Mussel today. Continue Aranesp injection every 2 weeks. Return for follow up in 12 weeks.  Thank you for choosing [place] at [facility] to provide your oncology and hematology care.  To afford each patient quality time with our provider, please arrive at least 15 minutes before your scheduled appointment time.   Beginning January 23rd 2017 lab work for the [facility] will be done in the  Main lab at [facility] on 1st floor. If you have a lab appointment with the [facility] please come in thru the  Main Entrance and check in at the main information desk  You need to re-schedule your appointment should you arrive 10 or more minutes late.  We strive to give you quality time with our providers, and arriving late affects you and other patients whose appointments are after yours.  Also, if you no show three or more times for appointments you may be dismissed from the clinic at the providers discretion.     Again, thank you for choosing [facility].  Our hope is that these requests will decrease the amount of time that you wait before being seen by our physicians.       _____________________________________________________________  Should you have questions after your visit to [facility], please contact our office at [phone] between the hours of 8:30 a.m. and 4:30 p.m.  Voicemails left after 4:30 p.m. will not be returned until the following business day.  For prescription refill requests, have your pharmacy contact our office.         Resources For Cancer Patients and their Caregivers ? American Cancer Society: Can assist with transportation, wigs, general needs, runs Look Good Feel Better.        520-006-1844 ? Cancer Care: Provides  financial assistance, online support groups, medication/co-pay assistance.  1-800-813-HOPE (413) 782-2214) ? [place] Assists [place] cancer patients and their families through emotional , educational and financial support.  416-429-4636 ? [place] DSS Where to apply for food stamps, Medicaid and utility assistance. [phone] ? RCATS: Transportation to medical appointments. (705) 550-2354 ? Social Security Administration: May apply for disability if have a Stage IV cancer. 2360359272 662-830-7825 ? [employer], Disability and Transit Services: Assists with nutrition, care and transit needs. [place] Support Programs: @10RELATIVEDAYS @ > Cancer Support Group  2nd Tuesday of the month 1pm-2pm, Journey Room  > Creative Journey  3rd Tuesday of the month 1130am-1pm, Journey Room  > Look Good Feel Better  1st Wednesday of the month 10am-12 noon, Journey Room (Call [place] to register [phone])

## 2015-12-17 NOTE — Progress Notes (Signed)
Keith Bogus, MD [place]  [place] [id number]  Anemia in chronic kidney disease  CURRENT THERAPY: Aranesp 50 mcg every 2 weeks, beginning on 01/21/2015 with a period of noncompliance from 06/03/2015- 09/22/2015.  INTERVAL HISTORY: Keith Young 65 y.o. male returns for followup of anemia of chronic renal disease, Stage III followed by Dr. Theador Hawthorne with an element of iron deficiency anemia and an element of anemia of chronic disease in the setting of an elevated ESR.  He denies any complaints. He denies any blood in the stools. He denies any black, tarry stools.  He denies any hemoptysis or blood loss.   Review of Systems  Constitutional: Negative.   HENT: Negative.   Eyes: Negative.   Respiratory: Negative.   Cardiovascular: Negative.   Gastrointestinal: Negative.   Genitourinary: Negative.   Musculoskeletal: Negative.   Skin: Negative.   Neurological: Negative.   Endo/Heme/Allergies: Negative.   Psychiatric/Behavioral: Negative.     Past Medical History  Diagnosis Date  . Chronic systolic heart failure (HCC)     NYHA Class III  . Type 2 diabetes mellitus ([place])   . Nonischemic cardiomyopathy (HCC)     EF 15-20%  . Essential hypertension, benign   . CKD (chronic kidney disease) stage 3, GFR 30-59 ml/min   . Anemia   . Hyperlipidemia   . History of stroke   . Bell's palsy   . Cardiomyopathy ([place])   . Aortic insufficiency   . Alcoholism in remission ([place])   . Noncompliance with medication regimen   . NSVT (nonsustained ventricular tachycardia) ([place])   . Cardiac defibrillator in situ   . COPD (chronic obstructive pulmonary disease) ([place])   . CVA (cerebral infarction) 01/2013  . Productive cough 08/2013    With brown sputum   . SOB (shortness of breath) on exertion 08/2013  . Numbness of right jaw     Had a stoke in 01/2013. Numbness is occasional, especially when trying to chew.  . At moderate risk for fall   . AICD (automatic  cardioverter/defibrillator) present   . Stroke [facility])     weakness of right side from CVA  . Arthritis     Past Surgical History  Procedure Laterality Date  . Cardiac defibrillator placement  11/12/12    [employer] Inogen MINI ICD implanted in [place] at [facility]  . Cardiac surgery      bioprostetic AVR per Dr Nelly Laurence note  . Colonoscopy with propofol N/A 04/10/2014    RMR: Colonic Diverticulosis  . Esophagogastroduodenoscopy (egd) with propofol N/A 04/10/2014    RMR: Mild erosive reflux esophagitis. Multiple antral polyps likely hyperplastic status post removal by hot snare cautery technique. Diffusely abnormal stomach status post gastric biopsy I suspect some of patients anemaia may be due to intermittent oozing from the stomach. It would be difficult and a risky proposition to attempt complete removal of all of his gastric polyps.  . Polypectomy N/A 04/10/2014    Procedure: GASTRIC POLYPECTOMY;  Surgeon: Daneil Dolin, MD;  Location: [facility] ORS;  Service: Endoscopy;  Laterality: N/A;  . Biopsy N/A 04/10/2014    Procedure: GASTRIC BIOPSY;  Surgeon: Daneil Dolin, MD;  Location: [facility] ORS;  Service: Endoscopy;  Laterality: N/A;  . Right heart catheterization N/A 02/06/2014    Procedure: RIGHT HEART CATH;  Surgeon: Larey Dresser, MD;  Location: [facility] CATH LAB;  Service: Cardiovascular;  Laterality: N/A;  . Esophagogastroduodenoscopy (egd) with propofol N/A  10/12/2015    Procedure: ESOPHAGOGASTRODUODENOSCOPY (EGD) WITH PROPOFOL;  Surgeon: Daneil Dolin, MD;  [facility];  Service: Endoscopy;  Laterality: N/A;  [id number] - moved to 9:15  . Biopsy  10/12/2015    Procedure: BIOPSY;  Surgeon: Daneil Dolin, MD;  Location: [facility] SU[facility];  Service: Endoscopy;;  stomach bx's    Family History  Problem Relation Age of Onset  . Kidney disease Mother   . Kidney disease Sister   . Colon cancer Neg Hx     Social History   Social History  . Marital Status: Single     Spouse Name: N/A  . Number of Children: N/A  . Years of Education: N/A   Social History Main Topics  . Smoking status: Former Smoker    Types: Cigarettes    Quit date: 11/05/1993  . Smokeless tobacco: Never Used  . Alcohol Use: Yes     Comment: former heavy ETOH, none recently  . Drug Use: No     Comment: prior history of cocaine use, smoking   . Sexual Activity: Yes    Birth Control/ Protection: None   Other Topics Concern  . None   Social History Narrative   Recently moved from [place] to be with family in [place]                 PHYSICAL EXAMINATION  ECOG PERFORMANCE STATUS: 1 - Symptomatic but completely ambulatory  Filed Vitals:   12/17/15 1110  BP: 131/65  Pulse: 65  Temp: 98.1 F (36.7 C)  Resp: 16    GENERAL:alert, no distress, well nourished, well developed, comfortable, cooperative and blunted affect and unaccompanied today. SKIN: skin color, texture, turgor are normal, no rashes or significant lesions HEAD: Normocephalic, No masses, lesions, tenderness or abnormalities EYES: normal, EOMI, Conjunctiva are pink and non-injected EARS: External ears normal OROPHARYNX:lips, buccal mucosa, and tongue normal and mucous membranes are moist  NECK: supple, trachea midline LYMPH:  no palpable lymphadenopathy BREAST:not examined LUNGS: clear to auscultation  HEART: regular rate & rhythm ABDOMEN:abdomen soft and normal bowel sounds BACK: Back symmetric, no curvature. EXTREMITIES:less then 2 second capillary refill, no joint deformities, effusion, or inflammation, no skin discoloration, no cyanosis  NEURO: alert & oriented x 3 with fluent speech, no focal motor/sensory deficits, gait normal   LABORATORY DATA: CBC    Component Value Date/Time   WBC 2.3* 12/17/2015 1011   RBC 3.45* 12/17/2015 1011   RBC 3.04* 10/29/2014 1610   HGB 10.0* 12/17/2015 1011   HCT 31.3* 12/17/2015 1011   PLT 110* 12/17/2015 1011   MCV 90.7 12/17/2015 1011   MCH 29.0  12/17/2015 1011   MCHC 31.9 12/17/2015 1011   RDW 15.1 12/17/2015 1011   LYMPHSABS 1.0 11/28/2015 1255   MONOABS 0.3 11/28/2015 1255   EOSABS 0.1 11/28/2015 1255   BASOSABS 0.0 11/28/2015 1255      Chemistry      Component Value Date/Time   NA 142 11/28/2015 1304   K 4.0 11/28/2015 1304   CL 106 11/28/2015 1304   CO2 24 09/22/2015 1128   BUN 25* 11/28/2015 1304   CREATININE 1.30* 11/28/2015 1304   CREATININE 2.33* 07/09/2014 1145      Component Value Date/Time   CALCIUM 9.3 09/22/2015 1128   CALCIUM 9.2 03/11/2015 1428   ALKPHOS 82 09/22/2015 1128   AST 39 09/22/2015 1128   ALT 28 09/22/2015 1128   BILITOT 0.4 09/22/2015 1128  PENDING LABS:   RADIOGRAPHIC STUDIES:  No results found.   PATHOLOGY:    ASSESSMENT AND PLAN:  Anemia in chronic kidney disease Anemia of chronic renal disease, Stage III followed by Dr. Theador Hawthorne with an element of iron deficiency anemia having received IV iron in the past and an element of anemia of chronic disease in the setting of an elevated ESR.   Requiring ESA therapy with an excellent response.  Labs today: CBC, ferritin.  I personally reviewed and went over laboratory results with the patient.  The results are noted within this dictation.  Aranesp injection today, 50 mcg.  Supportive therapy plan reviewed today.  Continue Aranesp 50 mcg every 2 weeks.  Labs every 2 weeks: CBC Labs every 90 days: Ferritin  Return in 12 weeks for follow-up.    ORDERS PLACED FOR THIS ENCOUNTER: No orders of the defined types were placed in this encounter.    MEDICATIONS PRESCRIBED THIS ENCOUNTER: No orders of the defined types were placed in this encounter.    THERAPY PLAN:  Continue supportive therapy plan as outlined above.  All questions were answered. The patient knows to call the clinic with any problems, questions or concerns. We can certainly see the patient much sooner if necessary.  Patient and plan discussed with Dr.  Ancil Linsey and she is in agreement with the aforementioned.   This note is electronically signed by: Doy Mince 12/18/2015 10:02 PM

## 2015-12-17 NOTE — Assessment & Plan Note (Addendum)
Anemia of chronic renal disease, Stage III followed by Dr. Theador Hawthorne with an element of iron deficiency anemia having received IV iron in the past and an element of anemia of chronic disease in the setting of an elevated ESR.   Requiring ESA therapy with an excellent response.  Labs today: CBC, ferritin.  I personally reviewed and went over laboratory results with the patient.  The results are noted within this dictation.  Aranesp injection today, 50 mcg.  Supportive therapy plan reviewed today.  Continue Aranesp 50 mcg every 2 weeks.  Labs every 2 weeks: CBC Labs every 90 days: Ferritin  Return in 12 weeks for follow-up.

## 2015-12-17 NOTE — Patient Instructions (Signed)
[facility] at [facility] Discharge Instructions  RECOMMENDATIONS MADE BY THE CONSULTANT AND ANY TEST RESULTS WILL BE SENT TO YOUR REFERRING PHYSICIAN. Aranesp today.    Thank you for choosing [facility] at [facility] to provide your oncology and hematology care.  To afford each patient quality time with our provider, please arrive at least 15 minutes before your scheduled appointment time.   Beginning January 23rd 2017 lab work for the Cancer Center will be done in the  Main lab at [facility] on 1st floor. If you have a lab appointment with the Cancer Center please come in thru the  Main Entrance and check in at the main information desk  You need to re-schedule your appointment should you arrive 10 or more minutes late.  We strive to give you quality time with our providers, and arriving late affects you and other patients whose appointments are after yours.  Also, if you no show three or more times for appointments you may be dismissed from the clinic at the providers discretion.     Again, thank you for choosing [facility].  Our hope is that these requests will decrease the amount of time that you wait before being seen by our physicians.       _____________________________________________________________  Should you have questions after your visit to  Cancer Center, please contact our office at [phone] between the hours of 8:30 a.m. and 4:30 p.m.  Voicemails left after 4:30 p.m. will not be returned until the following business day.  For prescription refill requests, have your pharmacy contact our office.         Resources For Cancer Patients and their Caregivers ? American Cancer Society: Can assist with transportation, wigs, general needs, runs Look Good Feel Better.        1-888-227-6333 ? Cancer Care: Provides financial assistance, online support groups, medication/co-pay assistance.  1-800-813-HOPE  (4673) ? [facility] Assists [place] cancer patients and their families through emotional , educational and financial support.  336-427-4357 ? [place] DSS Where to apply for food stamps, Medicaid and utility assistance. 336-342-1394 ? RCATS: Transportation to medical appointments. 336-347-2287 ? Social Security Administration: May apply for disability if have a Stage IV cancer. 336-342-7796 1-800-772-1213 ? [place] Aging, Disability and Transit Services: Assists with nutrition, care and transit needs. 336-349-2343  Cancer Center Support Programs: @10RELATIVEDAYS@ > Cancer Support Group  2nd Tuesday of the month 1pm-2pm, Journey Room  > Creative Journey  3rd Tuesday of the month 1130am-1pm, Journey Room  > Look Good Feel Better  1st Wednesday of the month 10am-12 noon, Journey Room (Call [employer] to register 1-800-395-5775)    

## 2015-12-17 NOTE — Progress Notes (Signed)
[place] presents today for injection per MD orders. Aranesp 50 mcg administered SQ in left Abdomen. Administration without incident. Patient tolerated well.  

## 2015-12-31 ENCOUNTER — Other Ambulatory Visit (HOSPITAL_COMMUNITY): Payer: Self-pay | Admitting: Oncology

## 2015-12-31 ENCOUNTER — Encounter (HOSPITAL_COMMUNITY): Payer: Medicare HMO

## 2015-12-31 ENCOUNTER — Encounter (HOSPITAL_COMMUNITY): Payer: Self-pay

## 2015-12-31 ENCOUNTER — Encounter (HOSPITAL_COMMUNITY): Payer: Medicare HMO | Attending: Hematology & Oncology

## 2015-12-31 VITALS — BP 119/60 | HR 62

## 2015-12-31 DIAGNOSIS — D631 Anemia in chronic kidney disease: Secondary | ICD-10-CM

## 2015-12-31 DIAGNOSIS — D649 Anemia, unspecified: Secondary | ICD-10-CM | POA: Diagnosis not present

## 2015-12-31 DIAGNOSIS — N189 Chronic kidney disease, unspecified: Secondary | ICD-10-CM

## 2015-12-31 DIAGNOSIS — N183 Chronic kidney disease, stage 3 (moderate): Secondary | ICD-10-CM | POA: Diagnosis not present

## 2015-12-31 LAB — CBC
HEMATOCRIT: 31.8 % — AB (ref 39.0–52.0)
HEMOGLOBIN: 10.2 g/dL — AB (ref 13.0–17.0)
MCH: 29.5 pg (ref 26.0–34.0)
MCHC: 32.1 g/dL (ref 30.0–36.0)
MCV: 91.9 fL (ref 78.0–100.0)
Platelets: 89 10*3/uL — ABNORMAL LOW (ref 150–400)
RBC: 3.46 MIL/uL — AB (ref 4.22–5.81)
RDW: 15.1 % (ref 11.5–15.5)
WBC: 2.3 10*3/uL — AB (ref 4.0–10.5)

## 2015-12-31 MED ORDER — DARBEPOETIN ALFA 60 MCG/0.3ML IJ SOSY
PREFILLED_SYRINGE | INTRAMUSCULAR | Status: AC
  Filled 2015-12-31: qty 0.3

## 2015-12-31 MED ORDER — DARBEPOETIN ALFA 60 MCG/0.3ML IJ SOSY
50.0000 ug | PREFILLED_SYRINGE | Freq: Once | INTRAMUSCULAR | Status: AC
  Administered 2015-12-31: 50 ug via SUBCUTANEOUS

## 2015-12-31 NOTE — Progress Notes (Signed)
Keith Young presents today for injection per MD orders. Aranesp 50 mcg administered SQ in left Abdomen. Administration without incident. Patient tolerated well.  

## 2015-12-31 NOTE — Patient Instructions (Signed)
[place] at [facility] Discharge Instructions  RECOMMENDATIONS MADE BY THE CONSULTANT AND ANY TEST RESULTS WILL BE SENT TO YOUR REFERRING PHYSICIAN.  Aranesp injection given today. Follow up as scheduled. Call for any concerns or questions.   Thank you for choosing [place] at [facility] to provide your oncology and hematology care.  To afford each patient quality time with our provider, please arrive at least 15 minutes before your scheduled appointment time.   Beginning January 23rd 2017 lab work for the [facility] will be done in the  Main lab at [facility] on 1st floor. If you have a lab appointment with the [place] please come in thru the  Main Entrance and check in at the main information desk  You need to re-schedule your appointment should you arrive 10 or more minutes late.  We strive to give you quality time with our providers, and arriving late affects you and other patients whose appointments are after yours.  Also, if you no show three or more times for appointments you may be dismissed from the clinic at the providers discretion.     Again, thank you for choosing [facility].  Our hope is that these requests will decrease the amount of time that you wait before being seen by our physicians.       _____________________________________________________________  Should you have questions after your visit to [facility], please contact our office at [phone] between the hours of 8:30 a.m. and 4:30 p.m.  Voicemails left after 4:30 p.m. will not be returned until the following business day.  For prescription refill requests, have your pharmacy contact our office.         Resources For Cancer Patients and their Caregivers ? American Cancer Society: Can assist with transportation, wigs, general needs, runs Look Good Feel Better.        608-730-0460 ? Cancer Care: Provides financial  assistance, online support groups, medication/co-pay assistance.  1-800-813-HOPE 947-010-4466) ? [place] Assists [place] cancer patients and their families through emotional , educational and financial support.  442-472-7909 ? [place] DSS Where to apply for food stamps, Medicaid and utility assistance. 302-368-3519 ? RCATS: Transportation to medical appointments. 509-860-3207 ? Social Security Administration: May apply for disability if have a Stage IV cancer. [phone] 704-224-5415 ? [employer], Disability and Transit Services: Assists with nutrition, care and transit needs. [place] Support Programs: @10RELATIVEDAYS @ > Cancer Support Group  2nd Tuesday of the month 1pm-2pm, Journey Room  > Creative Journey  3rd Tuesday of the month 1130am-1pm, Journey Room  > Look Good Feel Better  1st Wednesday of the month 10am-12 noon, Journey Room (Call [place] to register [phone])

## 2016-01-13 ENCOUNTER — Telehealth: Payer: Self-pay | Admitting: Gastroenterology

## 2016-01-13 ENCOUNTER — Ambulatory Visit: Payer: Medicare HMO | Admitting: Gastroenterology

## 2016-01-13 NOTE — Telephone Encounter (Signed)
PATIENT BROUGHT INTO APPOINTMENT BY [facility] AND WANTED TO KNOW WHY HE HAD AN APPOINTMENT,  TOLD HIM IT WAS FOR A FOLLOW UP FOR ANEMIA AND CARETAKER STATED THAT HE HAD TO PAY CO-PAY IF ONE WAS DUE AND HE SAW THE CANCER DOCTOR 2 TIMES A WEEK   FOR THAT AND WALKED OUT WITH PATIENT

## 2016-01-14 ENCOUNTER — Other Ambulatory Visit (HOSPITAL_COMMUNITY): Payer: Medicare HMO

## 2016-01-14 ENCOUNTER — Ambulatory Visit (HOSPITAL_COMMUNITY): Payer: Medicare HMO

## 2016-01-15 NOTE — Telephone Encounter (Signed)
Noted. Patient active with hematology for his anemia. Reviewed records and labs. He can continue follow up with them solely for the anemia. I would recommend recheck here with RMR in 6 months.

## 2016-01-21 ENCOUNTER — Encounter: Payer: Self-pay | Admitting: Internal Medicine

## 2016-01-21 ENCOUNTER — Ambulatory Visit (INDEPENDENT_AMBULATORY_CARE_PROVIDER_SITE_OTHER): Payer: Medicare HMO | Admitting: Internal Medicine

## 2016-01-21 VITALS — BP 118/70 | HR 73 | Ht 63.0 in | Wt 131.0 lb

## 2016-01-21 DIAGNOSIS — I5022 Chronic systolic (congestive) heart failure: Secondary | ICD-10-CM | POA: Diagnosis not present

## 2016-01-21 NOTE — Patient Instructions (Signed)
Your physician wants you to follow-up in: 1 Year with Dr. Lovena Le. You will receive a reminder letter in the mail two months in advance. If you don't receive a letter, please call our office to schedule the follow-up appointment.  Your physician recommends that you continue on your current medications as directed. Please refer to the Current Medication list given to you today.  Remote monitoring is used to monitor your Pacemaker of ICD from home. This monitoring reduces the number of office visits required to check your device to one time per year. It allows [place] to keep an eye on the functioning of your device to ensure it is working properly. You are scheduled for a device check from home on 04/23/16. You may send your transmission at any time that day. If you have a wireless device, the transmission will be sent automatically. After your physician reviews your transmission, you will receive a postcard with your next transmission date.  If you need a refill on your cardiac medications before your next appointment, please call your pharmacy.  Thank you for choosing [facility]!

## 2016-01-21 NOTE — Progress Notes (Signed)
HPI [name] returns today for followup. He is a pleasant 65 yo man with chronic systolic heart failure, non-ischemic CM, EF15%, chronic renal insufficiency and a recent diagnosis of multiple myeloma. He started chemotherapy this week. He denies chest pain or sob. No ICD therapies. No CHF admits. No edema. Since we saw her last he has gained about 10 lbs. He is walking regularly. No Known Allergies   Current Outpatient Prescriptions  Medication Sig Dispense Refill  . aspirin 81 MG EC tablet Take 1 tablet (81 mg total) by mouth daily.    Marland Kitchen atorvastatin (LIPITOR) 80 MG tablet Take 80 mg by mouth daily.     . carvedilol (COREG) 25 MG tablet Take 12.5 mg by mouth 2 (two) times daily with a meal.     . digoxin (LANOXIN) 0.125 MG tablet Take 0.5 tablets by mouth daily.     . ferrous sulfate 325 (65 FE) MG tablet Take 325 mg by mouth daily with breakfast.    . hydrALAZINE (APRESOLINE) 25 MG tablet Take 50 mg by mouth 3 (three) times daily.     . isosorbide dinitrate (ISORDIL) 20 MG tablet Take 20 mg by mouth 3 (three) times daily.     Marland Kitchen losartan (COZAAR) 25 MG tablet Take 25 mg by mouth daily.     Marland Kitchen NOVOLOG FLEXPEN 100 UNIT/ML FlexPen Up to 28 units daily sliding scale    . pantoprazole (PROTONIX) 40 MG tablet Take 1 tablet (40 mg total) by mouth daily. 30 tablet 11  . traMADol (ULTRAM) 50 MG tablet Take 50 mg by mouth every 6 (six) hours as needed for moderate pain.     . Cholecalciferol (VITAMIN D3) 2000 UNITS TABS Take 2,000 mg by mouth daily.    . mirabegron ER (MYRBETRIQ) 25 MG TB24 tablet Take 25 mg by mouth daily.     No current facility-administered medications for this visit.      Past Medical History:  Diagnosis Date  . AICD (automatic cardioverter/defibrillator) present   . Alcoholism in remission (Keith Young)   . Anemia   . Aortic insufficiency   . Arthritis   . At moderate risk for fall   . Bell's palsy   . Cardiac defibrillator in situ   . Cardiomyopathy ([place])   .  Chronic systolic heart failure (HCC)    NYHA Class III  . CKD (chronic kidney disease) stage 3, GFR 30-59 ml/min   . COPD (chronic obstructive pulmonary disease) ([place])   . CVA (cerebral infarction) 01/2013  . Essential hypertension, benign   . History of stroke   . Hyperlipidemia   . Noncompliance with medication regimen   . Nonischemic cardiomyopathy (HCC)    EF 15-20%  . NSVT (nonsustained ventricular tachycardia) ([place])   . Numbness of right jaw    Had a stoke in 01/2013. Numbness is occasional, especially when trying to chew.  . Productive cough 08/2013   With brown sputum   . SOB (shortness of breath) on exertion 08/2013  . Stroke Keith Young)    weakness of right side from CVA  . Type 2 diabetes mellitus (HCC)     ROS:   All systems reviewed and negative except as noted in the HPI.   Past Surgical History:  Procedure Laterality Date  . BIOPSY N/A 04/10/2014   Procedure: GASTRIC BIOPSY;  Surgeon: Keith Dolin, MD;  Location: [facility] ORS;  Service: Endoscopy;  Laterality: N/A;  . BIOPSY  10/12/2015   Procedure: BIOPSY;  Surgeon: Keith Dolin, MD;  Location: [facility] ENDO SUITE;  Service: Endoscopy;;  stomach bx's  . CARDIAC DEFIBRILLATOR PLACEMENT  11/12/12   [employer] Inogen MINI ICD implanted in [place] at [place] AV[facility] per Keith Young note  . COLONOSCOPY WITH PROPOFOL N/A 04/10/2014   RMR: Colonic Diverticulosis  . ESOPHAGOGASTRODUODENOSCOPY (EGD) WITH PROPOFOL N/A 04/10/2014   RMR: Mild erosive reflux esophagitis. Multiple antral polyps likely hyperplastic status post removal by hot snare cautery technique. Diffusely abnormal stomach status post gastric biopsy I suspect some of patients anemaia may be due to intermittent oozing from the stomach. It would be difficult and a risky proposition to attempt complete removal of all of his gastric polyps.  . ESOPHAGOGASTRODUODENOSCOPY (EGD) WITH PROPOFOL N/A 10/12/2015   Procedure:  ESOPHAGOGASTRODUODENOSCOPY (EGD) WITH PROPOFOL;  Surgeon: Keith Dolin, MD;  Location: [facility] ENDO SUITE;  Service: Endoscopy;  Laterality: N/A;  6886 - moved to 9:15  . POLYPECTOMY N/A 04/10/2014   Procedure: GASTRIC POLYPECTOMY;  Surgeon: Keith Dolin, MD;  Location: [facility] ORS;  Service: Endoscopy;  Laterality: N/A;  . RIGHT HEART CATHETERIZATION N/A 02/06/2014   Procedure: RIGHT HEART CATH;  Surgeon: Keith Dresser, MD;  Location: [facility] CATH LAB;  Service: Cardiovascular;  Laterality: N/A;     Family History  Problem Relation Age of Onset  . Kidney disease Mother   . Kidney disease Sister   . Colon cancer Neg Hx      Social History   Social History  . Marital status: Single    Spouse name: N/A  . Number of children: N/A  . Years of education: N/A   Occupational History  . Not on file.   Social History Main Topics  . Smoking status: Former Smoker    Types: Cigarettes    Quit date: 11/05/1993  . Smokeless tobacco: Never Used  . Alcohol use Yes     Comment: former heavy ETOH, none recently  . Drug use: No     Comment: prior history of cocaine use, smoking   . Sexual activity: Yes    Birth control/ protection: None   Other Topics Concern  . Not on file   Social History Narrative   Recently moved from [place] to be with family in [place]                 BP 118/70   Pulse 73   Ht '5\' 3"'  (1.6 m)   Wt 131 lb (59.4 kg)   SpO2 95%   BMI 23.21 kg/m   Physical Exam:  Well appearing 65 yo man, NAD HEENT: Unremarkable Neck:  6 cm JVD, no thyromegally Back:  No CVA tenderness Lungs:  Clear with no wheezes HEART:  Regular rate rhythm, no murmurs, no rubs, no clicks Abd:  soft, positive bowel sounds, no organomegally, no rebound, no guarding Ext:  2 plus pulses, no edema, no cyanosis, no clubbing Skin:  No rashes no nodules Neuro:  CN II through XII intact, motor grossly intact   DEVICE  Normal device function.  See PaceArt for details.   Assess/Plan: 1.  Chronic systolic heart failure - his symptoms are class 2. He will continue his current meds. 2. ICD - his [place] [employer] single chamber ICD is working normally. Will recheck in several months. 3. HTN - his blood pressure has been well controlled. Will follow.  Mikle Bosworth.D.

## 2016-01-25 NOTE — Telephone Encounter (Signed)
ON RECALL FOR 6 MONTH FU WITH RMR

## 2016-01-28 ENCOUNTER — Ambulatory Visit (HOSPITAL_COMMUNITY): Payer: Medicare HMO

## 2016-01-28 ENCOUNTER — Other Ambulatory Visit (HOSPITAL_COMMUNITY): Payer: Medicare HMO

## 2016-02-11 ENCOUNTER — Encounter (HOSPITAL_COMMUNITY): Payer: Self-pay

## 2016-02-11 ENCOUNTER — Encounter (HOSPITAL_COMMUNITY): Payer: Medicare HMO | Attending: Internal Medicine

## 2016-02-11 ENCOUNTER — Encounter (HOSPITAL_COMMUNITY): Payer: Medicare HMO

## 2016-02-11 VITALS — BP 107/50 | HR 59 | Temp 97.8°F | Resp 16

## 2016-02-11 DIAGNOSIS — D631 Anemia in chronic kidney disease: Secondary | ICD-10-CM

## 2016-02-11 DIAGNOSIS — N183 Chronic kidney disease, stage 3 (moderate): Secondary | ICD-10-CM

## 2016-02-11 DIAGNOSIS — N189 Chronic kidney disease, unspecified: Secondary | ICD-10-CM | POA: Diagnosis not present

## 2016-02-11 LAB — CBC
HEMATOCRIT: 33.3 % — AB (ref 39.0–52.0)
Hemoglobin: 10.6 g/dL — ABNORMAL LOW (ref 13.0–17.0)
MCH: 28.3 pg (ref 26.0–34.0)
MCHC: 31.8 g/dL (ref 30.0–36.0)
MCV: 88.8 fL (ref 78.0–100.0)
PLATELETS: 102 10*3/uL — AB (ref 150–400)
RBC: 3.75 MIL/uL — ABNORMAL LOW (ref 4.22–5.81)
RDW: 13.9 % (ref 11.5–15.5)
WBC: 2.2 10*3/uL — AB (ref 4.0–10.5)

## 2016-02-11 MED ORDER — DARBEPOETIN ALFA 60 MCG/0.3ML IJ SOSY
50.0000 ug | PREFILLED_SYRINGE | Freq: Once | INTRAMUSCULAR | Status: AC
  Administered 2016-02-11: 50 ug via SUBCUTANEOUS
  Filled 2016-02-11: qty 0.3

## 2016-02-11 NOTE — Progress Notes (Signed)
Pt here today for Aranesp injection. Pt given injection in right lower abdomen. Pt tolerated well. Pt stable and discharged home ambulatory. Pt to return as scheduled.

## 2016-02-11 NOTE — Patient Instructions (Signed)
[place] at [facility] Discharge Instructions  RECOMMENDATIONS MADE BY THE CONSULTANT AND ANY TEST RESULTS WILL BE SENT TO YOUR REFERRING PHYSICIAN.  You were given an Aranesp injection today.  Return as scheduled.   Thank you for choosing [place] at [facility] to provide your oncology and hematology care.  To afford each patient quality time with our provider, please arrive at least 15 minutes before your scheduled appointment time.   Beginning January 23rd 2017 lab work for the [facility] will be done in the  Main lab at [facility] on 1st floor. If you have a lab appointment with the [facility] please come in thru the  Main Entrance and check in at the main information desk  You need to re-schedule your appointment should you arrive 10 or more minutes late.  We strive to give you quality time with our providers, and arriving late affects you and other patients whose appointments are after yours.  Also, if you no show three or more times for appointments you may be dismissed from the clinic at the providers discretion.     Again, thank you for choosing [facility].  Our hope is that these requests will decrease the amount of time that you wait before being seen by our physicians.       _____________________________________________________________  Should you have questions after your visit to [facility], please contact our office at [phone] between the hours of 8:30 a.m. and 4:30 p.m.  Voicemails left after 4:30 p.m. will not be returned until the following business day.  For prescription refill requests, have your pharmacy contact our office.         Resources For Cancer Patients and their Caregivers ? American Cancer Society: Can assist with transportation, wigs, general needs, runs Look Good Feel Better.        9808131244 ? Cancer Care: Provides financial assistance, online support  groups, medication/co-pay assistance.  1-800-813-HOPE 778-804-9175) ? [place] Assists [place] cancer patients and their families through emotional , educational and financial support.  (905) [phone] ? [place] DSS Where to apply for food stamps, Medicaid and utility assistance. (989)870-9427 ? RCATS: Transportation to medical appointments. 435-213-9028 ? Social Security Administration: May apply for disability if have a Stage IV cancer. 332-769-2802 [phone] ? [employer], Disability and Transit Services: Assists with nutrition, care and transit needs. [place] Support Programs: @10RELATIVEDAYS @ > Cancer Support Group  2nd Tuesday of the month 1pm-2pm, Journey Room  > Creative Journey  3rd Tuesday of the month 1130am-1pm, Journey Room  > Look Good Feel Better  1st Wednesday of the month 10am-12 noon, Journey Room (Call [place] to register [phone])

## 2016-02-25 ENCOUNTER — Encounter (HOSPITAL_COMMUNITY): Payer: Medicare HMO

## 2016-02-25 ENCOUNTER — Encounter (HOSPITAL_BASED_OUTPATIENT_CLINIC_OR_DEPARTMENT_OTHER): Payer: Medicare HMO

## 2016-02-25 VITALS — BP 104/57 | HR 68 | Temp 97.7°F | Resp 18 | Wt 131.8 lb

## 2016-02-25 DIAGNOSIS — D631 Anemia in chronic kidney disease: Secondary | ICD-10-CM | POA: Diagnosis not present

## 2016-02-25 DIAGNOSIS — N183 Chronic kidney disease, stage 3 (moderate): Secondary | ICD-10-CM

## 2016-02-25 DIAGNOSIS — N189 Chronic kidney disease, unspecified: Principal | ICD-10-CM

## 2016-02-25 LAB — CBC
HCT: 34.4 % — ABNORMAL LOW (ref 39.0–52.0)
Hemoglobin: 11 g/dL — ABNORMAL LOW (ref 13.0–17.0)
MCH: 28.8 pg (ref 26.0–34.0)
MCHC: 32 g/dL (ref 30.0–36.0)
MCV: 90.1 fL (ref 78.0–100.0)
Platelets: 102 10*3/uL — ABNORMAL LOW (ref 150–400)
RBC: 3.82 MIL/uL — AB (ref 4.22–5.81)
RDW: 14.9 % (ref 11.5–15.5)
WBC: 2.8 10*3/uL — AB (ref 4.0–10.5)

## 2016-02-25 MED ORDER — DARBEPOETIN ALFA 60 MCG/0.3ML IJ SOSY
50.0000 ug | PREFILLED_SYRINGE | Freq: Once | INTRAMUSCULAR | Status: AC
  Administered 2016-02-25: 50 ug via SUBCUTANEOUS
  Filled 2016-02-25: qty 0.3

## 2016-02-25 NOTE — Patient Instructions (Signed)
[place] at [facility] Discharge Instructions  RECOMMENDATIONS MADE BY THE CONSULTANT AND ANY TEST RESULTS WILL BE SENT TO YOUR REFERRING PHYSICIAN.  Received Aranesp today. Follow-up as scheduled. Call clinic for any questions or concerns  Thank you for choosing [place] at [facility] to provide your oncology and hematology care.  To afford each patient quality time with our provider, please arrive at least 15 minutes before your scheduled appointment time.   Beginning January 23rd 2017 lab work for the [facility] will be done in the  Main lab at [facility] on 1st floor. If you have a lab appointment with the [facility] please come in thru the  Main Entrance and check in at the main information desk  You need to re-schedule your appointment should you arrive 10 or more minutes late.  We strive to give you quality time with our providers, and arriving late affects you and other patients whose appointments are after yours.  Also, if you no show three or more times for appointments you may be dismissed from the clinic at the providers discretion.     Again, thank you for choosing [facility].  Our hope is that these requests will decrease the amount of time that you wait before being seen by our physicians.       _____________________________________________________________  Should you have questions after your visit to [facility], please contact our office at [phone] between the hours of 8:30 a.m. and 4:30 p.m.  Voicemails left after 4:30 p.m. will not be returned until the following business day.  For prescription refill requests, have your pharmacy contact our office.         Resources For Cancer Patients and their Caregivers ? American Cancer Society: Can assist with transportation, wigs, general needs, runs Look Good Feel Better.        [phone] ? Cancer Care: Provides financial  assistance, online support groups, medication/co-pay assistance.  1-800-813-HOPE 6464040947) ? [place] Assists [place] cancer patients and their families through emotional , educational and financial support.  915-029-9223 ? [place] DSS Where to apply for food stamps, Medicaid and utility assistance. 509-164-3190 ? RCATS: Transportation to medical appointments. 5068728778 ? Social Security Administration: May apply for disability if have a Stage IV cancer. [phone] ? [employer], Disability and Transit Services: Assists with nutrition, care and transit needs. [place] Support Programs: @10RELATIVEDAYS @ > Cancer Support Group  2nd Tuesday of the month 1pm-2pm, Journey Room  > Creative Journey  3rd Tuesday of the month 1130am-1pm, Journey Room  > Look Good Feel Better  1st Wednesday of the month 10am-12 noon, Journey Room (Call [place] to register [phone])

## 2016-02-25 NOTE — Progress Notes (Signed)
This encounter was created in error - please disregard.

## 2016-02-25 NOTE — Progress Notes (Signed)
Keith Young tolerated Aranesp injection well without complaints. Labs including HGB 11, reviewed with Dr. Whitney Muse and Aranesp injection approved for today per MD. Pt discharged self ambulatory in satisfactory condition with family

## 2016-03-10 ENCOUNTER — Encounter (HOSPITAL_COMMUNITY): Payer: Medicare HMO | Attending: Hematology & Oncology

## 2016-03-10 ENCOUNTER — Ambulatory Visit (HOSPITAL_COMMUNITY): Payer: Medicare HMO | Admitting: Hematology & Oncology

## 2016-03-10 ENCOUNTER — Encounter (HOSPITAL_COMMUNITY): Payer: Medicare HMO

## 2016-03-10 DIAGNOSIS — N189 Chronic kidney disease, unspecified: Principal | ICD-10-CM

## 2016-03-10 DIAGNOSIS — D631 Anemia in chronic kidney disease: Secondary | ICD-10-CM

## 2016-03-10 LAB — CBC
HCT: 35.5 % — ABNORMAL LOW (ref 39.0–52.0)
Hemoglobin: 11.1 g/dL — ABNORMAL LOW (ref 13.0–17.0)
MCH: 28.5 pg (ref 26.0–34.0)
MCHC: 31.3 g/dL (ref 30.0–36.0)
MCV: 91 fL (ref 78.0–100.0)
PLATELETS: 108 10*3/uL — AB (ref 150–400)
RBC: 3.9 MIL/uL — ABNORMAL LOW (ref 4.22–5.81)
RDW: 15.1 % (ref 11.5–15.5)
WBC: 2.9 10*3/uL — AB (ref 4.0–10.5)

## 2016-03-10 NOTE — Progress Notes (Signed)
Hemoglobin 11.1 today. Per protocol, no need for Aranesp.

## 2016-03-14 ENCOUNTER — Other Ambulatory Visit: Payer: Self-pay | Admitting: Internal Medicine

## 2016-03-23 ENCOUNTER — Encounter (HOSPITAL_COMMUNITY): Payer: Medicare HMO

## 2016-03-23 DIAGNOSIS — N189 Chronic kidney disease, unspecified: Principal | ICD-10-CM

## 2016-03-23 DIAGNOSIS — D631 Anemia in chronic kidney disease: Secondary | ICD-10-CM

## 2016-03-23 LAB — CBC
HCT: 37.3 % — ABNORMAL LOW (ref 39.0–52.0)
HEMOGLOBIN: 12 g/dL — AB (ref 13.0–17.0)
MCH: 29 pg (ref 26.0–34.0)
MCHC: 32.2 g/dL (ref 30.0–36.0)
MCV: 90.1 fL (ref 78.0–100.0)
PLATELETS: 112 10*3/uL — AB (ref 150–400)
RBC: 4.14 MIL/uL — AB (ref 4.22–5.81)
RDW: 14.2 % (ref 11.5–15.5)
WBC: 2.7 10*3/uL — AB (ref 4.0–10.5)

## 2016-03-23 NOTE — Progress Notes (Signed)
Hemoglobin 12.0 today. Aranesp not given/treatment parameters not met. Return as scheduled.

## 2016-03-25 ENCOUNTER — Ambulatory Visit (HOSPITAL_COMMUNITY): Payer: Medicare HMO

## 2016-04-05 ENCOUNTER — Other Ambulatory Visit (HOSPITAL_COMMUNITY): Payer: Self-pay | Admitting: *Deleted

## 2016-04-05 DIAGNOSIS — D61818 Other pancytopenia: Secondary | ICD-10-CM

## 2016-04-08 ENCOUNTER — Ambulatory Visit (HOSPITAL_COMMUNITY): Payer: Medicare HMO | Admitting: Hematology & Oncology

## 2016-04-11 ENCOUNTER — Encounter (HOSPITAL_COMMUNITY): Payer: Medicare HMO

## 2016-04-11 ENCOUNTER — Encounter (HOSPITAL_BASED_OUTPATIENT_CLINIC_OR_DEPARTMENT_OTHER): Payer: Medicare HMO | Admitting: Oncology

## 2016-04-11 ENCOUNTER — Encounter (HOSPITAL_COMMUNITY): Payer: Self-pay | Admitting: Oncology

## 2016-04-11 ENCOUNTER — Encounter (HOSPITAL_COMMUNITY): Payer: Medicare HMO | Attending: Hematology & Oncology

## 2016-04-11 VITALS — BP 141/63 | HR 60 | Temp 98.2°F | Resp 18 | Wt 135.4 lb

## 2016-04-11 DIAGNOSIS — D61818 Other pancytopenia: Secondary | ICD-10-CM

## 2016-04-11 DIAGNOSIS — D631 Anemia in chronic kidney disease: Secondary | ICD-10-CM

## 2016-04-11 DIAGNOSIS — D509 Iron deficiency anemia, unspecified: Secondary | ICD-10-CM | POA: Diagnosis not present

## 2016-04-11 DIAGNOSIS — N183 Chronic kidney disease, stage 3 unspecified: Secondary | ICD-10-CM

## 2016-04-11 DIAGNOSIS — D649 Anemia, unspecified: Secondary | ICD-10-CM | POA: Diagnosis not present

## 2016-04-11 DIAGNOSIS — N189 Chronic kidney disease, unspecified: Secondary | ICD-10-CM

## 2016-04-11 LAB — CBC
HEMATOCRIT: 32.2 % — AB (ref 39.0–52.0)
Hemoglobin: 10.3 g/dL — ABNORMAL LOW (ref 13.0–17.0)
MCH: 28.7 pg (ref 26.0–34.0)
MCHC: 32 g/dL (ref 30.0–36.0)
MCV: 89.7 fL (ref 78.0–100.0)
Platelets: 100 10*3/uL — ABNORMAL LOW (ref 150–400)
RBC: 3.59 MIL/uL — ABNORMAL LOW (ref 4.22–5.81)
RDW: 13.6 % (ref 11.5–15.5)
WBC: 2.7 10*3/uL — ABNORMAL LOW (ref 4.0–10.5)

## 2016-04-11 MED ORDER — DARBEPOETIN ALFA 60 MCG/0.3ML IJ SOSY
PREFILLED_SYRINGE | INTRAMUSCULAR | Status: AC
  Filled 2016-04-11: qty 0.3

## 2016-04-11 MED ORDER — DARBEPOETIN ALFA 60 MCG/0.3ML IJ SOSY
50.0000 ug | PREFILLED_SYRINGE | Freq: Once | INTRAMUSCULAR | Status: AC
  Administered 2016-04-11: 50 ug via SUBCUTANEOUS

## 2016-04-11 NOTE — Assessment & Plan Note (Addendum)
Anemia of chronic renal disease, Stage III followed by Dr. Theador Hawthorne with an element of iron deficiency anemia having received IV iron in the past and an element of anemia of chronic disease in the setting of an elevated ESR.   Requiring ESA therapy with an excellent response.  Labs today: CBC, ferritin.  I personally reviewed and went over laboratory results with the patient.  The results are noted within this dictation.  Supportive therapy plan reviewed today.  Aranesp 50 mcg injection today as planned.  Labs every 2 weeks: CBC Labs every 90 days: Ferritin  Return in 12 weeks for follow-up.  Depending on his ESA needs, we can consider changing dosing schedule in the future.

## 2016-04-11 NOTE — Patient Instructions (Addendum)
[place] at [facility] Discharge Instructions  RECOMMENDATIONS MADE BY THE CONSULTANT AND ANY TEST RESULTS WILL BE SENT TO YOUR REFERRING PHYSICIAN.  Labs every 2 weeks  Aranesp every 2 weeks  Return in 12 weeks for follow up with Tom  Aranesp injection today   Thank you for choosing [place] at [facility] to provide your oncology and hematology care.  To afford each patient quality time with our provider, please arrive at least 15 minutes before your scheduled appointment time.   Beginning January 23rd 2017 lab work for the [facility] will be done in the  Main lab at [facility] on 1st floor. If you have a lab appointment with the [facility] please come in thru the  Main Entrance and check in at the main information desk  You need to re-schedule your appointment should you arrive 10 or more minutes late.  We strive to give you quality time with our providers, and arriving late affects you and other patients whose appointments are after yours.  Also, if you no show three or more times for appointments you may be dismissed from the clinic at the providers discretion.     Again, thank you for choosing [facility].  Our hope is that these requests will decrease the amount of time that you wait before being seen by our physicians.       _____________________________________________________________  Should you have questions after your visit to [facility], please contact our office at [phone] between the hours of 8:30 a.m. and 4:30 p.m.  Voicemails left after 4:30 p.m. will not be returned until the following business day.  For prescription refill requests, have your pharmacy contact our office.         Resources For Cancer Patients and their Caregivers ? American Cancer Society: Can assist with transportation, wigs, general needs, runs Look Good Feel Better.        ([phone] [phone] ? Cancer  Care: Provides financial assistance, online support groups, medication/co-pay assistance.  1-800-813-HOPE (725)431-6243) ? [place] Assists [place] cancer patients and their families through emotional , educational and financial support.  509-799-2775 ? [place] DSS Where to apply for food stamps, Medicaid and utility assistance. 980-276-5914 ? RCATS: Transportation to medical appointments. 805-237-1386 ? Social Security Administration: May apply for disability if have a Stage IV cancer. 708-095-6760 [phone])604-0700 ? [employer], Disability and Transit Services: Assists with nutrition, care and transit needs. [place] Support Programs: @10RELATIVEDAYS @ > Cancer Support Group  2nd Tuesday of the month 1pm-2pm, Journey Room  > Creative Journey  3rd Tuesday of the month 1130am-1pm, Journey Room  > Look Good Feel Better  1st Wednesday of the month 10am-12 noon, Journey Room (Call [place] to register [phone])

## 2016-04-11 NOTE — Patient Instructions (Signed)
[place] at [facility] Discharge Instructions  RECOMMENDATIONS MADE BY THE CONSULTANT AND ANY TEST RESULTS WILL BE SENT TO YOUR REFERRING PHYSICIAN.  Hemoglobin 10.3 today. Aranesp 50 mcg injection given as ordered. Return as scheduled.  Thank you for choosing [place] at [facility] to provide your oncology and hematology care.  To afford each patient quality time with our provider, please arrive at least 15 minutes before your scheduled appointment time.   Beginning January 23rd 2017 lab work for the [facility] will be done in the  Main lab at [facility] on 1st floor. If you have a lab appointment with the [facility] please come in thru the  Main Entrance and check in at the main information desk  You need to re-schedule your appointment should you arrive 10 or more minutes late.  We strive to give you quality time with our providers, and arriving late affects you and other patients whose appointments are after yours.  Also, if you no show three or more times for appointments you may be dismissed from the clinic at the providers discretion.     Again, thank you for choosing [facility].  Our hope is that these requests will decrease the amount of time that you wait before being seen by our physicians.       _____________________________________________________________  Should you have questions after your visit to [facility], please contact our office at [phone] between the hours of 8:30 a.m. and 4:30 p.m.  Voicemails left after 4:30 p.m. will not be returned until the following business day.  For prescription refill requests, have your pharmacy contact our office.         Resources For Cancer Patients and their Caregivers ? American Cancer Society: Can assist with transportation, wigs, general needs, runs Look Good Feel Better.        437-173-1120 ? Cancer Care: Provides financial  assistance, online support groups, medication/co-pay assistance.  1-800-813-HOPE 5148184295) ? [place] Assists [place] cancer patients and their families through emotional , educational and financial support.  847-484-4534 ? [place] DSS Where to apply for food stamps, Medicaid and utility assistance. 305-355-3817 ? RCATS: Transportation to medical appointments. 980-733-0353 ? Social Security Administration: May apply for disability if have a Stage IV cancer. 312-164-3593 [phone] ? [employer], Disability and Transit Services: Assists with nutrition, care and transit needs. [place] Support Programs: @10RELATIVEDAYS @ > Cancer Support Group  2nd Tuesday of the month 1pm-2pm, Journey Room  > Creative Journey  3rd Tuesday of the month 1130am-1pm, Journey Room  > Look Good Feel Better  1st Wednesday of the month 10am-12 noon, Journey Room (Call [name] to register [phone])

## 2016-04-11 NOTE — Progress Notes (Signed)
[name], MD [place] [employer] [id number]  Anemia in stage 3 chronic kidney disease - Plan: CBC, Ferritin, Ferritin  CURRENT THERAPY: Aranesp 50 mcg every 2 weeks dosing beginning on 01/21/2015 with a period of noncompliance from 06/03/2015- 09/22/2015.  INTERVAL HISTORY: Keith Young 65 y.o. male returns for followup of anemia of chronic renal disease, Stage III followed by Dr. Theador Hawthorne with an element of iron deficiency anemia and an element of anemia of chronic disease in the setting of an elevated ESR.  He is doing well.  He denies any blood in his stool or dark stools.  He denies any hematuria.  He denies any blood loss.  Review of Systems  Constitutional: Negative.  Negative for chills, fever and weight loss.  HENT: Negative.   Eyes: Negative.  Negative for double vision.  Respiratory: Negative.  Negative for cough, hemoptysis and shortness of breath.   Cardiovascular: Negative.  Negative for chest pain.  Gastrointestinal: Negative.  Negative for blood in stool, constipation, diarrhea and melena.  Genitourinary: Negative.  Negative for hematuria.  Musculoskeletal: Negative.  Negative for falls.  Skin: Negative.   Neurological: Negative.  Negative for weakness.  Endo/Heme/Allergies: Negative.  Does not bruise/bleed easily.  Psychiatric/Behavioral: Negative.     Past Medical History:  Diagnosis Date  . AICD (automatic cardioverter/defibrillator) present   . Alcoholism in remission ([place])   . Anemia   . Aortic insufficiency   . Arthritis   . At moderate risk for fall   . Bell's palsy   . Cardiac defibrillator in situ   . Cardiomyopathy ([place])   . Chronic systolic heart failure (HCC)    NYHA Class III  . CKD (chronic kidney disease) stage 3, GFR 30-59 ml/min   . COPD (chronic obstructive pulmonary disease) ([place])   . CVA (cerebral infarction) 01/2013  . Essential hypertension, benign   . History of stroke   . Hyperlipidemia   .  Noncompliance with medication regimen   . Nonischemic cardiomyopathy (HCC)    EF 15-20%  . NSVT (nonsustained ventricular tachycardia) ([place])   . Numbness of right jaw    Had a stoke in 01/2013. Numbness is occasional, especially when trying to chew.  . Productive cough 08/2013   With brown sputum   . SOB (shortness of breath) on exertion 08/2013  . Stroke [facility])    weakness of right side from CVA  . Type 2 diabetes mellitus ([place])     Past Surgical History:  Procedure Laterality Date  . BIOPSY N/A 04/10/2014   Procedure: GASTRIC BIOPSY;  Surgeon: Daneil Dolin, MD;  Location: [facility] ORS;  Service: Endoscopy;  Laterality: N/A;  . BIOPSY  10/12/2015   Procedure: BIOPSY;  Surgeon: Daneil Dolin, MD;  Location: [facility] ENDO SUITE;  Service: Endoscopy;;  stomach bx's  . CARDIAC DEFIBRILLATOR PLACEMENT  11/12/12   [employer] Inogen MINI ICD implanted in [place] at [place] [facility] per Dr Nelly Laurence note  . COLONOSCOPY WITH PROPOFOL N/A 04/10/2014   RMR: Colonic Diverticulosis  . ESOPHAGOGASTRODUODENOSCOPY (EGD) WITH PROPOFOL N/A 04/10/2014   RMR: Mild erosive reflux esophagitis. Multiple antral polyps likely hyperplastic status post removal by hot snare cautery technique. Diffusely abnormal stomach status post gastric biopsy I suspect some of patients anemaia may be due to intermittent oozing from the stomach. It would be difficult and a risky proposition to attempt complete removal of  all of his gastric polyps.  . ESOPHAGOGASTRODUODENOSCOPY (EGD) WITH PROPOFOL N/A 10/12/2015   Procedure: ESOPHAGOGASTRODUODENOSCOPY (EGD) WITH PROPOFOL;  Surgeon: Daneil Dolin, MD;  Location: [facility] ENDO SUITE;  Service: Endoscopy;  Laterality: N/A;  6269 - moved to 9:15  . POLYPECTOMY N/A 04/10/2014   Procedure: GASTRIC POLYPECTOMY;  Surgeon: Daneil Dolin, MD;  Location: [facility] ORS;  Service: Endoscopy;  Laterality: N/A;  . RIGHT HEART CATHETERIZATION N/A 02/06/2014    Procedure: RIGHT HEART CATH;  Surgeon: Larey Dresser, MD;  Location: [facility] CATH LAB;  Service: Cardiovascular;  Laterality: N/A;    Family History  Problem Relation Age of Onset  . Kidney disease Mother   . Kidney disease Sister   . Colon cancer Neg Hx     Social History   Social History  . Marital status: Single    Spouse name: N/A  . Number of children: N/A  . Years of education: N/A   Social History Main Topics  . Smoking status: Former Smoker    Types: Cigarettes    Quit date: 11/05/1993  . Smokeless tobacco: Never Used  . Alcohol use Yes     Comment: former heavy ETOH, none recently  . Drug use: No     Comment: prior history of cocaine use, smoking   . Sexual activity: Yes    Birth control/ protection: None   Other Topics Concern  . None   Social History Narrative   Recently moved from [place] to be with family in [place]  ECOG PERFORMANCE STATUS: 1 - Symptomatic but completely ambulatory  Vitals:   04/11/16 1253  BP: (!) 141/63  Pulse: 60  Resp: 18  Temp: 98.2 F (36.8 C)    GENERAL:alert, no distress, well nourished, well developed, comfortable, cooperative and blunted affect and unaccompanied today. SKIN: skin color, texture, turgor are normal, no rashes or significant lesions HEAD: Normocephalic, No masses, lesions, tenderness or abnormalities EYES: normal, EOMI, Conjunctiva are pink and non-injected EARS: External ears normal OROPHARYNX:lips, buccal mucosa, and tongue normal and mucous membranes are moist  NECK: supple, trachea midline LYMPH:  no palpable lymphadenopathy BREAST:not examined LUNGS: clear to auscultation  HEART: regular rate & rhythm ABDOMEN:abdomen soft and normal bowel sounds BACK: Back symmetric, no curvature. EXTREMITIES:less then 2 second capillary refill, no joint deformities, effusion, or inflammation, no skin discoloration, no cyanosis  NEURO: alert & oriented x 3 with fluent  speech, no focal motor/sensory deficits, gait normal   LABORATORY DATA: CBC    Component Value Date/Time   WBC 2.7 (L) 04/11/2016 1215   RBC 3.59 (L) 04/11/2016 1215   HGB 10.3 (L) 04/11/2016 1215   HCT 32.2 (L) 04/11/2016 1215   PLT 100 (L) 04/11/2016 1215   MCV 89.7 04/11/2016 1215   MCH 28.7 04/11/2016 1215   MCHC 32.0 04/11/2016 1215   RDW 13.6 04/11/2016 1215   LYMPHSABS 1.0 11/28/2015 1255   MONOABS 0.3 11/28/2015 1255   EOSABS 0.1 11/28/2015 1255   BASOSABS 0.0 11/28/2015 1255      Chemistry      Component Value Date/Time   NA 142 11/28/2015 1304   K 4.0 11/28/2015 1304   CL 106 11/28/2015 1304   CO2 24 09/22/2015 1128   BUN 25 (H) 11/28/2015 1304   CREATININE 1.30 (H) 11/28/2015 1304   CREATININE 2.33 (H) 07/09/2014 1145      Component Value Date/Time  CALCIUM 9.3 09/22/2015 1128   CALCIUM 9.2 03/11/2015 1428   ALKPHOS 82 09/22/2015 1128   AST 39 09/22/2015 1128   ALT 28 09/22/2015 1128   BILITOT 0.4 09/22/2015 1128        PENDING LABS:   RADIOGRAPHIC STUDIES:  No results found.   PATHOLOGY:    ASSESSMENT AND PLAN:  Anemia in chronic kidney disease Anemia of chronic renal disease, Stage III followed by Dr. Theador Hawthorne with an element of iron deficiency anemia having received IV iron in the past and an element of anemia of chronic disease in the setting of an elevated ESR.   Requiring ESA therapy with an excellent response.  Labs today: CBC, ferritin.  I personally reviewed and went over laboratory results with the patient.  The results are noted within this dictation.  Supportive therapy plan reviewed today.  Aranesp 50 mcg injection today as planned.  Labs every 2 weeks: CBC Labs every 90 days: Ferritin  Return in 12 weeks for follow-up.  Depending on his ESA needs, we can consider changing dosing schedule in the future.   ORDERS PLACED FOR THIS ENCOUNTER: Orders Placed This Encounter  Procedures  . CBC  . Ferritin  . Ferritin     MEDICATIONS PRESCRIBED THIS ENCOUNTER: No orders of the defined types were placed in this encounter.   THERAPY PLAN:  Continue supportive therapy plan as outlined above.  All questions were answered. The patient knows to call the clinic with any problems, questions or concerns. We can certainly see the patient much sooner if necessary.  Patient and plan discussed with Dr. Ancil Linsey and she is in agreement with the aforementioned.   This note is electronically signed by: Robynn Pane, PA-C 04/11/2016 1:28 PM

## 2016-04-11 NOTE — Progress Notes (Signed)
Keith Young presents today for injection per MD orders. Aranesp 50 mcg administered SQ in left Abdomen. Administration without incident. Patient tolerated well.

## 2016-04-25 ENCOUNTER — Ambulatory Visit (INDEPENDENT_AMBULATORY_CARE_PROVIDER_SITE_OTHER): Payer: Medicare HMO | Admitting: *Deleted

## 2016-04-25 DIAGNOSIS — I429 Cardiomyopathy, unspecified: Secondary | ICD-10-CM

## 2016-04-26 ENCOUNTER — Encounter (HOSPITAL_COMMUNITY): Payer: Medicare HMO

## 2016-04-26 ENCOUNTER — Encounter (HOSPITAL_BASED_OUTPATIENT_CLINIC_OR_DEPARTMENT_OTHER): Payer: Medicare HMO

## 2016-04-26 VITALS — BP 121/73 | HR 70 | Temp 98.1°F | Resp 18

## 2016-04-26 DIAGNOSIS — N183 Chronic kidney disease, stage 3 unspecified: Secondary | ICD-10-CM

## 2016-04-26 DIAGNOSIS — N189 Chronic kidney disease, unspecified: Secondary | ICD-10-CM

## 2016-04-26 DIAGNOSIS — D631 Anemia in chronic kidney disease: Secondary | ICD-10-CM | POA: Diagnosis not present

## 2016-04-26 DIAGNOSIS — D649 Anemia, unspecified: Secondary | ICD-10-CM | POA: Diagnosis not present

## 2016-04-26 LAB — CBC
HCT: 32.1 % — ABNORMAL LOW (ref 39.0–52.0)
Hemoglobin: 10 g/dL — ABNORMAL LOW (ref 13.0–17.0)
MCH: 28.7 pg (ref 26.0–34.0)
MCHC: 31.2 g/dL (ref 30.0–36.0)
MCV: 92 fL (ref 78.0–100.0)
PLATELETS: 111 10*3/uL — AB (ref 150–400)
RBC: 3.49 MIL/uL — ABNORMAL LOW (ref 4.22–5.81)
RDW: 14.6 % (ref 11.5–15.5)
WBC: 2.4 10*3/uL — AB (ref 4.0–10.5)

## 2016-04-26 LAB — FERRITIN: Ferritin: 203 ng/mL (ref 24–336)

## 2016-04-26 MED ORDER — DARBEPOETIN ALFA 60 MCG/0.3ML IJ SOSY
50.0000 ug | PREFILLED_SYRINGE | Freq: Once | INTRAMUSCULAR | Status: AC
  Administered 2016-04-26: 50 ug via SUBCUTANEOUS
  Filled 2016-04-26: qty 0.3

## 2016-04-26 NOTE — Patient Instructions (Signed)
[place] at [facility] Discharge Instructions  RECOMMENDATIONS MADE BY THE CONSULTANT AND ANY TEST RESULTS WILL BE SENT TO YOUR REFERRING PHYSICIAN.  Hemoglobin 10.0 today. Aranesp 50 mcg injection given as ordered. Return as scheduled.  Thank you for choosing [place] at [facility] to provide your oncology and hematology care.  To afford each patient quality time with our provider, please arrive at least 15 minutes before your scheduled appointment time.   Beginning January 23rd 2017 lab work for the [facility] will be done in the  Main lab at [facility] on 1st floor. If you have a lab appointment with the [facility] please come in thru the  Main Entrance and check in at the main information desk  You need to re-schedule your appointment should you arrive 10 or more minutes late.  We strive to give you quality time with our providers, and arriving late affects you and other patients whose appointments are after yours.  Also, if you no show three or more times for appointments you may be dismissed from the clinic at the providers discretion.     Again, thank you for choosing [facility].  Our hope is that these requests will decrease the amount of time that you wait before being seen by our physicians.       _____________________________________________________________  Should you have questions after your visit to [facility], please contact our office at [phone] between the hours of 8:30 a.m. and 4:30 p.m.  Voicemails left after 4:30 p.m. will not be returned until the following business day.  For prescription refill requests, have your pharmacy contact our office.         Resources For Cancer Patients and their Caregivers ? American Cancer Society: Can assist with transportation, wigs, general needs, runs Look Good Feel Better.        [phone] ? Cancer Care: Provides financial  assistance, online support groups, medication/co-pay assistance.  1-800-813-HOPE [phone]) ? [place] Assists [place] cancer patients and their families through emotional , educational and financial support.  534-336-3750 ? [place] DSS Where to apply for food stamps, Medicaid and utility assistance. 848-493-8202 ? RCATS: Transportation to medical appointments. 313-516-3609 ? Social Security Administration: May apply for disability if have a Stage IV cancer. 707-524-5542 272-725-0962 ? [employer], Disability and Transit Services: Assists with nutrition, care and transit needs. [place] Support Programs: @10RELATIVEDAYS @ > Cancer Support Group  2nd Tuesday of the month 1pm-2pm, Journey Room  > Creative Journey  3rd Tuesday of the month 1130am-1pm, Journey Room  > Look Good Feel Better  1st Wednesday of the month 10am-12 noon, Journey Room (Call [place] to register [phone])

## 2016-04-26 NOTE — Progress Notes (Signed)
Keith Young presents today for injection per MD orders. Aranesp 50 mcg administered SQ in left Abdomen. Administration without incident. Patient tolerated well.

## 2016-04-26 NOTE — Progress Notes (Signed)
Remote ICD transmission.   

## 2016-04-28 ENCOUNTER — Encounter: Payer: Self-pay | Admitting: Cardiology

## 2016-05-09 ENCOUNTER — Encounter (HOSPITAL_COMMUNITY): Payer: Self-pay

## 2016-05-09 ENCOUNTER — Encounter (HOSPITAL_COMMUNITY): Payer: Medicare HMO | Attending: Oncology

## 2016-05-09 ENCOUNTER — Encounter (HOSPITAL_COMMUNITY): Payer: Medicare HMO

## 2016-05-09 VITALS — BP 133/75 | HR 72 | Temp 98.2°F | Resp 18

## 2016-05-09 DIAGNOSIS — D631 Anemia in chronic kidney disease: Secondary | ICD-10-CM

## 2016-05-09 DIAGNOSIS — N189 Chronic kidney disease, unspecified: Secondary | ICD-10-CM

## 2016-05-09 DIAGNOSIS — N183 Chronic kidney disease, stage 3 unspecified: Secondary | ICD-10-CM

## 2016-05-09 LAB — FERRITIN: Ferritin: 221 ng/mL (ref 24–336)

## 2016-05-09 LAB — CBC
HEMATOCRIT: 34.5 % — AB (ref 39.0–52.0)
HEMOGLOBIN: 10.7 g/dL — AB (ref 13.0–17.0)
MCH: 28.6 pg (ref 26.0–34.0)
MCHC: 31 g/dL (ref 30.0–36.0)
MCV: 92.2 fL (ref 78.0–100.0)
Platelets: 125 10*3/uL — ABNORMAL LOW (ref 150–400)
RBC: 3.74 MIL/uL — ABNORMAL LOW (ref 4.22–5.81)
RDW: 14.5 % (ref 11.5–15.5)
WBC: 2.3 10*3/uL — ABNORMAL LOW (ref 4.0–10.5)

## 2016-05-09 MED ORDER — DARBEPOETIN ALFA 60 MCG/0.3ML IJ SOSY
50.0000 ug | PREFILLED_SYRINGE | Freq: Once | INTRAMUSCULAR | Status: AC
  Administered 2016-05-09: 50 ug via SUBCUTANEOUS

## 2016-05-09 MED ORDER — DARBEPOETIN ALFA 60 MCG/0.3ML IJ SOSY
PREFILLED_SYRINGE | INTRAMUSCULAR | Status: AC
Start: 2016-05-09 — End: 2016-05-09
  Filled 2016-05-09: qty 0.3

## 2016-05-09 NOTE — Patient Instructions (Signed)
[place] at [facility] Discharge Instructions  RECOMMENDATIONS MADE BY THE CONSULTANT AND ANY TEST RESULTS WILL BE SENT TO YOUR REFERRING PHYSICIAN.  Aranesp.    Thank you for choosing [place] at [facility] to provide your oncology and hematology care.  To afford each patient quality time with our provider, please arrive at least 15 minutes before your scheduled appointment time.   Beginning January 23rd 2017 lab work for the [facility] will be done in the  Main lab at [facility] on 1st floor. If you have a lab appointment with the [facility] please come in thru the  Main Entrance and check in at the main information desk  You need to re-schedule your appointment should you arrive 10 or more minutes late.  We strive to give you quality time with our providers, and arriving late affects you and other patients whose appointments are after yours.  Also, if you no show three or more times for appointments you may be dismissed from the clinic at the providers discretion.     Again, thank you for choosing [facility].  Our hope is that these requests will decrease the amount of time that you wait before being seen by our physicians.       _____________________________________________________________  Should you have questions after your visit to [facility], please contact our office at [phone] between the hours of 8:30 a.m. and 4:30 p.m.  Voicemails left after 4:30 p.m. will not be returned until the following business day.  For prescription refill requests, have your pharmacy contact our office.         Resources For Cancer Patients and their Caregivers ? American Cancer Society: Can assist with transportation, wigs, general needs, runs Look Good Feel Better.        540-186-3781 ? Cancer Care: Provides financial assistance, online support groups, medication/co-pay assistance.  1-800-813-HOPE  254-316-1973) ? [place] Assists [place] cancer patients and their families through emotional , educational and financial support.  450-393-9570 ? [place] DSS Where to apply for food stamps, Medicaid and utility assistance. 401-764-2386 ? RCATS: Transportation to medical appointments. 407-811-6742 ? Social Security Administration: May apply for disability if have a Stage IV cancer. [phone] [phone])[phone] ? [employer], Disability and Transit Services: Assists with nutrition, care and transit needs. [place] Support Programs: @10RELATIVEDAYS @ > Cancer Support Group  2nd Tuesday of the month 1pm-2pm, Journey Room  > Creative Journey  3rd Tuesday of the month 1130am-1pm, Journey Room  > Look Good Feel Better  1st Wednesday of the month 10am-12 noon, Journey Room (Call [place] to register [phone])

## 2016-05-09 NOTE — Progress Notes (Signed)
Keith Young presents today for injection per MD orders. Aranesp 34mcg administered SQ in right abdomen. Administration without incident. Patient tolerated well.

## 2016-05-24 ENCOUNTER — Encounter (HOSPITAL_COMMUNITY): Payer: Self-pay

## 2016-05-24 ENCOUNTER — Other Ambulatory Visit: Payer: Self-pay | Admitting: Gastroenterology

## 2016-05-24 ENCOUNTER — Encounter (HOSPITAL_COMMUNITY): Payer: Medicare HMO

## 2016-05-24 ENCOUNTER — Encounter (HOSPITAL_BASED_OUTPATIENT_CLINIC_OR_DEPARTMENT_OTHER): Payer: Medicare HMO

## 2016-05-24 VITALS — BP 148/75 | HR 73 | Temp 98.2°F | Resp 18

## 2016-05-24 DIAGNOSIS — D631 Anemia in chronic kidney disease: Secondary | ICD-10-CM

## 2016-05-24 DIAGNOSIS — N183 Chronic kidney disease, stage 3 unspecified: Secondary | ICD-10-CM

## 2016-05-24 LAB — CBC
HCT: 35.1 % — ABNORMAL LOW (ref 39.0–52.0)
HEMOGLOBIN: 11 g/dL — AB (ref 13.0–17.0)
MCH: 28.9 pg (ref 26.0–34.0)
MCHC: 31.3 g/dL (ref 30.0–36.0)
MCV: 92.1 fL (ref 78.0–100.0)
PLATELETS: 98 10*3/uL — AB (ref 150–400)
RBC: 3.81 MIL/uL — AB (ref 4.22–5.81)
RDW: 13.8 % (ref 11.5–15.5)
WBC: 2.3 10*3/uL — AB (ref 4.0–10.5)

## 2016-05-24 MED ORDER — DARBEPOETIN ALFA 60 MCG/0.3ML IJ SOSY
50.0000 ug | PREFILLED_SYRINGE | Freq: Once | INTRAMUSCULAR | Status: AC
  Administered 2016-05-24: 50 ug via SUBCUTANEOUS

## 2016-05-24 MED ORDER — DARBEPOETIN ALFA 60 MCG/0.3ML IJ SOSY
PREFILLED_SYRINGE | INTRAMUSCULAR | Status: AC
  Filled 2016-05-24: qty 0.3

## 2016-05-24 NOTE — Patient Instructions (Signed)
[place] at [facility]  Discharge Instructions:  Aranesp injection today Follow up as scheduled. _______________________________________________________________  Thank you for choosing [place] at [facility] to provide your oncology and hematology care.  To afford each patient quality time with our providers, please arrive at least 15 minutes before your scheduled appointment.  You need to re-schedule your appointment if you arrive 10 or more minutes late.  We strive to give you quality time with our providers, and arriving late affects you and other patients whose appointments are after yours.  Also, if you no show three or more times for appointments you may be dismissed from the clinic.  Again, thank you for choosing [place] at [place] hope is that these requests will allow you access to exceptional care and in a timely manner. _______________________________________________________________  If you have questions after your visit, please contact our office at [phone] between the hours of 8:30 a.m. and 5:00 p.m. Voicemails left after 4:30 p.m. will not be returned until the following business day. _______________________________________________________________  For prescription refill requests, have your pharmacy contact our office. _______________________________________________________________  Recommendations made by the consultant and any test results will be sent to your referring physician. _______________________________________________________________

## 2016-05-24 NOTE — Progress Notes (Signed)
Keith Young presents today for injection per MD orders. Aranesp 31mcg administered SQ in right Abdomen. Administration without incident. Patient tolerated well.  Patient vs stable, discharged ambulatory from clinic, follow up as scheduled

## 2016-05-31 LAB — CUP PACEART REMOTE DEVICE CHECK
Battery Remaining Longevity: 54 mo
Date Time Interrogation Session: 20171127081100
HighPow Impedance: 49 Ohm
Implantable Lead Implant Date: 20140617
Implantable Lead Location: 753862
Implantable Lead Serial Number: 310936
Implantable Pulse Generator Implant Date: 20140617
Lead Channel Pacing Threshold Pulse Width: 0.4 ms
Lead Channel Setting Pacing Amplitude: 3 V
Lead Channel Setting Sensing Sensitivity: 0.6 mV
MDC IDC LEAD MODEL: 180
MDC IDC MSMT BATTERY REMAINING PERCENTAGE: 100 %
MDC IDC MSMT LEADCHNL RV IMPEDANCE VALUE: 397 Ohm
MDC IDC MSMT LEADCHNL RV PACING THRESHOLD AMPLITUDE: 1.5 V
MDC IDC SET LEADCHNL RV PACING PULSEWIDTH: 1.2 ms
MDC IDC STAT BRADY RV PERCENT PACED: 0 %
Pulse Gen Serial Number: 436636

## 2016-06-02 ENCOUNTER — Encounter: Payer: Self-pay | Admitting: Internal Medicine

## 2016-06-03 ENCOUNTER — Encounter (HOSPITAL_COMMUNITY): Payer: Self-pay

## 2016-06-03 ENCOUNTER — Inpatient Hospital Stay (HOSPITAL_COMMUNITY)
Admission: EM | Admit: 2016-06-03 | Discharge: 2016-06-08 | DRG: 190 | Disposition: A | Discharge status: Home Health | Payer: Medicare HMO | Attending: Pulmonary Disease | Admitting: Pulmonary Disease

## 2016-06-03 ENCOUNTER — Emergency Department (HOSPITAL_COMMUNITY): Payer: Medicare HMO

## 2016-06-03 ENCOUNTER — Other Ambulatory Visit: Payer: Self-pay

## 2016-06-03 DIAGNOSIS — Z951 Presence of aortocoronary bypass graft: Secondary | ICD-10-CM

## 2016-06-03 DIAGNOSIS — R2689 Other abnormalities of gait and mobility: Secondary | ICD-10-CM

## 2016-06-03 DIAGNOSIS — Z8673 Personal history of transient ischemic attack (TIA), and cerebral infarction without residual deficits: Secondary | ICD-10-CM

## 2016-06-03 DIAGNOSIS — I10 Essential (primary) hypertension: Secondary | ICD-10-CM | POA: Diagnosis present

## 2016-06-03 DIAGNOSIS — Z95810 Presence of automatic (implantable) cardiac defibrillator: Secondary | ICD-10-CM

## 2016-06-03 DIAGNOSIS — I130 Hypertensive heart and chronic kidney disease with heart failure and stage 1 through stage 4 chronic kidney disease, or unspecified chronic kidney disease: Secondary | ICD-10-CM | POA: Diagnosis present

## 2016-06-03 DIAGNOSIS — E785 Hyperlipidemia, unspecified: Secondary | ICD-10-CM | POA: Diagnosis present

## 2016-06-03 DIAGNOSIS — Z7982 Long term (current) use of aspirin: Secondary | ICD-10-CM

## 2016-06-03 DIAGNOSIS — N183 Chronic kidney disease, stage 3 unspecified: Secondary | ICD-10-CM | POA: Diagnosis present

## 2016-06-03 DIAGNOSIS — J440 Chronic obstructive pulmonary disease with acute lower respiratory infection: Principal | ICD-10-CM | POA: Diagnosis present

## 2016-06-03 DIAGNOSIS — E1122 Type 2 diabetes mellitus with diabetic chronic kidney disease: Secondary | ICD-10-CM | POA: Diagnosis present

## 2016-06-03 DIAGNOSIS — I5023 Acute on chronic systolic (congestive) heart failure: Secondary | ICD-10-CM | POA: Diagnosis present

## 2016-06-03 DIAGNOSIS — Y95 Nosocomial condition: Secondary | ICD-10-CM | POA: Diagnosis present

## 2016-06-03 DIAGNOSIS — F1021 Alcohol dependence, in remission: Secondary | ICD-10-CM | POA: Diagnosis present

## 2016-06-03 DIAGNOSIS — J189 Pneumonia, unspecified organism: Secondary | ICD-10-CM | POA: Diagnosis present

## 2016-06-03 DIAGNOSIS — Z79899 Other long term (current) drug therapy: Secondary | ICD-10-CM

## 2016-06-03 DIAGNOSIS — R4182 Altered mental status, unspecified: Secondary | ICD-10-CM | POA: Diagnosis not present

## 2016-06-03 DIAGNOSIS — Z841 Family history of disorders of kidney and ureter: Secondary | ICD-10-CM

## 2016-06-03 DIAGNOSIS — Z66 Do not resuscitate: Secondary | ICD-10-CM | POA: Diagnosis present

## 2016-06-03 LAB — CBC WITH DIFFERENTIAL/PLATELET
Basophils Absolute: 0 K/uL (ref 0.0–0.1)
Basophils Relative: 0 %
Eosinophils Absolute: 0.1 K/uL (ref 0.0–0.7)
Eosinophils Relative: 1 %
HCT: 31.4 % — ABNORMAL LOW (ref 39.0–52.0)
Hemoglobin: 10.2 g/dL — ABNORMAL LOW (ref 13.0–17.0)
Lymphocytes Relative: 15 %
Lymphs Abs: 0.6 K/uL — ABNORMAL LOW (ref 0.7–4.0)
MCH: 29.1 pg (ref 26.0–34.0)
MCHC: 32.5 g/dL (ref 30.0–36.0)
MCV: 89.5 fL (ref 78.0–100.0)
Monocytes Absolute: 0.6 K/uL (ref 0.1–1.0)
Monocytes Relative: 13 %
Neutro Abs: 3.1 K/uL (ref 1.7–7.7)
Neutrophils Relative %: 71 %
Platelets: 124 K/uL — ABNORMAL LOW (ref 150–400)
RBC: 3.51 MIL/uL — ABNORMAL LOW (ref 4.22–5.81)
RDW: 13.4 % (ref 11.5–15.5)
WBC: 4.3 K/uL (ref 4.0–10.5)

## 2016-06-03 LAB — CBG MONITORING, ED: GLUCOSE-CAPILLARY: 123 mg/dL — AB (ref 65–99)

## 2016-06-03 LAB — I-STAT CG4 LACTIC ACID, ED: LACTIC ACID, VENOUS: 0.77 mmol/L (ref 0.5–1.9)

## 2016-06-03 MED ORDER — ACETAMINOPHEN 650 MG RE SUPP
650.0000 mg | Freq: Once | RECTAL | Status: AC
  Administered 2016-06-03: 650 mg via RECTAL
  Filled 2016-06-03: qty 1

## 2016-06-03 MED ORDER — SODIUM CHLORIDE 0.9 % IV BOLUS (SEPSIS)
500.0000 mL | Freq: Once | INTRAVENOUS | Status: AC
  Administered 2016-06-03: 500 mL via INTRAVENOUS

## 2016-06-03 NOTE — ED Triage Notes (Signed)
Pt is from [place] near [facility], reportedly over the past week pt has had some intermittent altered loc, is awake and alert, but not able to answer all questions appropriately.  Pt denies pain, is oriented to person and place.

## 2016-06-03 NOTE — ED Provider Notes (Signed)
[facility] DEPT Provider Note   CSN: [id number] Arrival date & time: 06/03/16  2225  By signing my name below, I, Judithe Modest, attest that this documentation has been prepared under the direction and in the presence of Ezequiel Essex, MD. Electronically Signed: Judithe Modest, ER Scribe. 01/09/2016. 12:00 AM.  History   Chief Complaint Chief Complaint  Patient presents with  . Altered Mental Status    Level 5 caveat due to altered mental status.  The history is provided by the EMS personnel and a relative. No language interpreter was used.  Altered Mental Status     HPI Comments: Keith Young is a 66 y.o. male who presents to the Emergency Department brought in by EMS due to mental status change. He lives at [facility] group home in [place]. Per EMS the group home reported he has had intermittently altered mental status over the past week, as well as intermittent cough, vomiting and low blood sugar. He has a past hx of alcoholism and drug use but no known recent use. He denies HA, CP, abdominal pain, cough, fever, vomiting, sore throat or other complaints. He has significant PMHx.   His sister states he had a flu shot this year and normally can ambulate on his own.   Past Medical History:  Diagnosis Date  . AICD (automatic cardioverter/defibrillator) present   . Alcoholism in remission ([place])   . Anemia   . Aortic insufficiency   . Arthritis   . At moderate risk for fall   . Bell's palsy   . Cardiac defibrillator in situ   . Cardiomyopathy ([place])   . Chronic systolic heart failure (HCC)    NYHA Class III  . CKD (chronic kidney disease) stage 3, GFR 30-59 ml/min   . COPD (chronic obstructive pulmonary disease) ([place])   . CVA (cerebral infarction) 01/2013  . Essential hypertension, benign   . History of stroke   . Hyperlipidemia   . Noncompliance with medication regimen   . Nonischemic cardiomyopathy (HCC)    EF 15-20%  . NSVT (nonsustained ventricular  tachycardia) ([place])   . Numbness of right jaw    Had a stoke in 01/2013. Numbness is occasional, especially when trying to chew.  . Productive cough 08/2013   With brown sputum   . SOB (shortness of breath) on exertion 08/2013  . Stroke [facility])    weakness of right side from CVA  . Type 2 diabetes mellitus ([place])   . Uses hearing aid 2014   recently received new hearing aids    Patient Active Problem List   Diagnosis Date Noted  . Mucosal abnormality of stomach   . Gastritis and gastroduodenitis 09/30/2015  . Heme positive stool 09/30/2015  . Pancytopenia ([place]) 09/30/2015  . Chest pain 04/28/2015  . Elevated troponin 04/28/2015  . ICD (implantable cardioverter-defibrillator) in place 01/22/2015  . Anemia 09/30/2014  . Gastric polyp   . Reflux esophagitis   . Elevated LFTs 03/26/2014  . Anemia in chronic kidney disease 03/26/2014  . Chronic systolic CHF (congestive heart failure) ([place]) 01/21/2014  . Carotid bruit 12/18/2013  . Acute on chronic systolic heart failure ([place]) 10/24/2013  . CHF (congestive heart failure), NYHA class III ([place]) 10/23/2013  . Cough 10/23/2013  . CKD (chronic kidney disease) 10/23/2013  . Type II or unspecified type diabetes mellitus without mention of complication, not stated as uncontrolled 10/23/2013  . Essential hypertension, benign 10/23/2013  . Hyperkalemia 10/23/2013  . CAD (coronary artery disease)  10/23/2013  . History of stroke 10/23/2013  . Anemia of chronic kidney failure 10/23/2013  . Other and unspecified hyperlipidemia 10/23/2013    Past Surgical History:  Procedure Laterality Date  . BIOPSY N/A 04/10/2014   Procedure: GASTRIC BIOPSY;  Surgeon: Daneil Dolin, MD;  Location: [facility] ORS;  Service: Endoscopy;  Laterality: N/A;  . BIOPSY  10/12/2015   Procedure: BIOPSY;  Surgeon: Daneil Dolin, MD;  Location: [facility] ENDO SUITE;  Service: Endoscopy;;  stomach bx's  . CARDIAC DEFIBRILLATOR PLACEMENT  11/12/12   [employer] Inogen MINI ICD  implanted in [place] at [place] [facility] per Dr Nelly Laurence note  . COLONOSCOPY WITH PROPOFOL N/A 04/10/2014   RMR: Colonic Diverticulosis  . ESOPHAGOGASTRODUODENOSCOPY (EGD) WITH PROPOFOL N/A 04/10/2014   RMR: Mild erosive reflux esophagitis. Multiple antral polyps likely hyperplastic status post removal by hot snare cautery technique. Diffusely abnormal stomach status post gastric biopsy I suspect some of patients anemaia may be due to intermittent oozing from the stomach. It would be difficult and a risky proposition to attempt complete removal of all of his gastric polyps.  . ESOPHAGOGASTRODUODENOSCOPY (EGD) WITH PROPOFOL N/A 10/12/2015   Procedure: ESOPHAGOGASTRODUODENOSCOPY (EGD) WITH PROPOFOL;  Surgeon: Daneil Dolin, MD;  Location: [facility] ENDO SU[facility];  Service: Endoscopy;  Laterality: N/A;  2130 - moved to 9:15  . POLYPECTOMY N/A 04/10/2014   Procedure: GASTRIC POLYPECTOMY;  Surgeon: Daneil Dolin, MD;  Location: [facility] ORS;  Service: Endoscopy;  Laterality: N/A;  . RIGHT HEART CATHETERIZATION N/A 02/06/2014   Procedure: RIGHT HEART CATH;  Surgeon: Larey Dresser, MD;  Location: [facility] CATH LAB;  Service: Cardiovascular;  Laterality: N/A;       Home Medications    Prior to Admission medications   Medication Sig Start Date End Date Taking? Authorizing Provider  aspirin 81 MG EC tablet Take 1 tablet (81 mg total) by mouth daily. 12/17/13  Yes Larey Dresser, MD  atorvastatin (LIPITOR) 80 MG tablet Take 80 mg by mouth daily.    Yes Historical Provider, MD  carvedilol (COREG) 25 MG tablet Take 12.5 mg by mouth 2 (two) times daily with a meal.    Yes Historical Provider, MD  Cholecalciferol (VITAMIN D3) 2000 UNITS TABS Take 2,000 mg by mouth daily.   Yes Historical Provider, MD  digoxin (LANOXIN) 0.125 MG tablet TAKE 1/2 TABLET BY MOUTH DAILY 03/15/16  Yes Jolaine Artist, MD  ferrous sulfate 325 (65 FE) MG tablet Take 325 mg by mouth daily with  breakfast.   Yes Historical Provider, MD  furosemide (LASIX) 40 MG tablet Take 40 mg by mouth daily.   Yes Historical Provider, MD  isosorbide dinitrate (ISORDIL) 20 MG tablet Take 40 mg by mouth 3 (three) times daily.  09/29/15  Yes Historical Provider, MD  mirabegron ER (MYRBETRIQ) 25 MG TB24 tablet Take 25 mg by mouth daily.   Yes Historical Provider, MD  mupirocin ointment (BACTROBAN) 2 % Place 1 application into the nose 2 (two) times daily.   Yes Historical Provider, MD  NOVOLOG FLEXPEN 100 UNIT/ML FlexPen Inject 1-28 Units into the skin daily. Up to 28 units daily  By sliding scale 09/02/15  Yes Historical Provider, MD  pantoprazole (PROTONIX) 40 MG tablet TAKE ONE TABLET   BY MOUTH   DAILY 05/25/16  Yes Mahala Menghini, PA-C    Family History Family History  Problem Relation Age of Onset  . Kidney disease Mother   .  Kidney disease Sister   . Colon cancer Neg Hx     Social History Social History  Substance Use Topics  . Smoking status: Former Smoker    Types: Cigarettes    Quit date: 11/05/1993  . Smokeless tobacco: Never Used  . Alcohol use Yes     Comment: former heavy ETOH, none recently     Allergies   Patient has no known allergies.   Review of Systems Review of Systems  Unable to perform ROS: Mental status change     Physical Exam Updated Vital Signs BP 138/91 (BP Location: Right Arm)   Pulse 108   Temp 102.9 F (39.4 C) (Rectal)   Resp 20   Ht 5\' 2"  (1.575 m)   Wt 132 lb (59.9 kg)   SpO2 95%   BMI 24.14 kg/m   Physical Exam  Constitutional: He is oriented to person, place, and time. He appears well-developed and well-nourished. No distress.  Dry mucous membranes, febrile. Chronically ill appearing. Cachectic.   HENT:  Head: Normocephalic and atraumatic.  Mouth/Throat: Oropharynx is clear and moist. No oropharyngeal exudate.  Eyes: Conjunctivae and EOM are normal. Pupils are equal, round, and reactive to light.  Neck: Normal range of motion. Neck  supple.  No meningismus.  Cardiovascular: Normal rate, regular rhythm, normal heart sounds and intact distal pulses.   No murmur heard. Tachycardic. AICD left chest  Pulmonary/Chest: Effort normal and breath sounds normal. No respiratory distress.  Abdominal: Soft. There is no tenderness. There is no rebound and no guarding.  Musculoskeletal: Normal range of motion. He exhibits no edema or tenderness.  Neurological: He is alert and oriented to person, place, and time. No cranial nerve deficit. He exhibits normal muscle tone. Coordination normal.  5/5 strength throughout follows commands. Alert and oriented x 2.  Skin: Skin is warm.  Psychiatric: He has a normal mood and affect. His behavior is normal.  Nursing note and vitals reviewed.    ED Treatments / Results  DIAGNOSTIC STUDIES: Oxygen Saturation is 97% on RA, normal by my interpretation.    COORDINATION OF CARE: 11:57 PM Discussed treatment plan with pt at bedside and pt agreed to plan.  Labs (all labs ordered are listed, but only abnormal results are displayed) Labs Reviewed  CBC WITH DIFFERENTIAL/PLATELET - Abnormal; Notable for the following:       Result Value   RBC 3.51 (*)    Hemoglobin 10.2 (*)    HCT 31.4 (*)    Platelets 124 (*)    Lymphs Abs 0.6 (*)    All other components within normal limits  COMPREHENSIVE METABOLIC PANEL - Abnormal; Notable for the following:    Glucose, Bld 119 (*)    BUN 32 (*)    Creatinine, Ser 1.75 (*)    Calcium 8.6 (*)    Albumin 2.4 (*)    AST 42 (*)    GFR calc non Af Amer 39 (*)    GFR calc Af Amer 46 (*)    Anion gap 4 (*)    All other components within normal limits  TROPONIN I - Abnormal; Notable for the following:    Troponin I 0.27 (*)    All other components within normal limits  DIGOXIN LEVEL - Abnormal; Notable for the following:    Digoxin Level 0.3 (*)    All other components within normal limits  CBG MONITORING, ED - Abnormal; Notable for the following:     Glucose-Capillary 123 (*)    All  other components within normal limits  CULTURE, BLOOD (ROUTINE X 2)  CULTURE, BLOOD (ROUTINE X 2)  URINE CULTURE  INFLUENZA PANEL BY PCR (TYPE A & B, H1N1)  URINALYSIS, ROUTINE W REFLEX MICROSCOPIC  I-STAT CG4 LACTIC ACID, ED  I-STAT CG4 LACTIC ACID, ED    EKG  EKG Interpretation  Date/Time:  Friday June 03 2016 22:41:03 EST Ventricular Rate:  112 PR Interval:    QRS Duration: 105 QT Interval:  315 QTC Calculation: 430 R Axis:   -56 Text Interpretation:  Sinus tachycardia Probable left atrial enlargement Left anterior fascicular block LVH with secondary repolarization abnormality No significant change was found Confirmed by [place] [phone]) on 06/04/2016 12:24:03 AM       Radiology Dg Chest 2 View  Result Date: 06/04/2016 CLINICAL DATA:  Altered mental status.  Fever. EXAM: CHEST  2 VIEW COMPARISON:  04/28/2015 FINDINGS: There is unchanged moderate cardiomegaly. 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 There are intact appearances of the transvenous cardiac lead. Patchy opacity in the retrocardiac left base could represent early infectious infiltrate. Right lung is clear. No pleural effusions. IMPRESSION: Patchy left base opacity, possibly infectious. Unchanged cardiomegaly. Electronically Signed   By: Andreas Newport M.D.   On: 06/04/2016 00:04    Procedures Procedures (including critical care time)  Medications Ordered in ED Medications  sodium chloride 0.9 % bolus 500 mL (500 mLs Intravenous New Bag/Given 06/03/16 2339)  acetaminophen (TYLENOL) suppository 650 mg (650 mg Rectal Given 06/03/16 2253)     Initial Impression / Assessment and Plan / ED Course  I have reviewed the triage vital signs and the nursing notes.  Pertinent labs & imaging results that were available during my care of the patient were reviewed by me and considered in my medical decision making (see chart for details).  Clinical Course    Patient from nursing  home with change in mental status, coughing and fever. Febrile to 102.9 on arrival. Denies pain, denies difficulty breathing. Septic workup pursued. Appears mildly dry.  Hx systolic CHF  Will hydrate gently.   lactate is normal. White blood cell count is normal. Chest x-ray shows left basilar infiltrate. Patient was not given 30 mL/kg of fluid due to his history of heart failure. Blood pressure is normal. Blood cultures sent.  Patient will be treated for healthcare associated pneumonia. Troponin elevation similar to previous. Patient denies chest pain. No EKG changes. Cr near baseline. No O2 requirement.    Admission d/w Dr. Marin Comment.  Final Clinical Impressions(s) / ED Diagnoses   Final diagnoses:  HCAP (healthcare-associated pneumonia)    New Prescriptions New Prescriptions   No medications on file     I personally performed the services described in this documentation, which was scribed in my presence. The recorded information has been reviewed and is accurate.      Ezequiel Essex, MD 06/04/16 [phone]

## 2016-06-04 ENCOUNTER — Encounter (HOSPITAL_COMMUNITY): Payer: Self-pay | Admitting: Internal Medicine

## 2016-06-04 DIAGNOSIS — Z841 Family history of disorders of kidney and ureter: Secondary | ICD-10-CM | POA: Diagnosis not present

## 2016-06-04 DIAGNOSIS — E1122 Type 2 diabetes mellitus with diabetic chronic kidney disease: Secondary | ICD-10-CM | POA: Diagnosis present

## 2016-06-04 DIAGNOSIS — J189 Pneumonia, unspecified organism: Secondary | ICD-10-CM

## 2016-06-04 DIAGNOSIS — E785 Hyperlipidemia, unspecified: Secondary | ICD-10-CM | POA: Diagnosis present

## 2016-06-04 DIAGNOSIS — I5023 Acute on chronic systolic (congestive) heart failure: Secondary | ICD-10-CM

## 2016-06-04 DIAGNOSIS — J440 Chronic obstructive pulmonary disease with acute lower respiratory infection: Secondary | ICD-10-CM | POA: Diagnosis present

## 2016-06-04 DIAGNOSIS — Z7982 Long term (current) use of aspirin: Secondary | ICD-10-CM | POA: Diagnosis not present

## 2016-06-04 DIAGNOSIS — Z951 Presence of aortocoronary bypass graft: Secondary | ICD-10-CM | POA: Diagnosis not present

## 2016-06-04 DIAGNOSIS — Z8673 Personal history of transient ischemic attack (TIA), and cerebral infarction without residual deficits: Secondary | ICD-10-CM | POA: Diagnosis not present

## 2016-06-04 DIAGNOSIS — F1021 Alcohol dependence, in remission: Secondary | ICD-10-CM | POA: Diagnosis present

## 2016-06-04 DIAGNOSIS — Z79899 Other long term (current) drug therapy: Secondary | ICD-10-CM | POA: Diagnosis not present

## 2016-06-04 DIAGNOSIS — I10 Essential (primary) hypertension: Secondary | ICD-10-CM | POA: Diagnosis not present

## 2016-06-04 DIAGNOSIS — I130 Hypertensive heart and chronic kidney disease with heart failure and stage 1 through stage 4 chronic kidney disease, or unspecified chronic kidney disease: Secondary | ICD-10-CM | POA: Diagnosis present

## 2016-06-04 DIAGNOSIS — N183 Chronic kidney disease, stage 3 (moderate): Secondary | ICD-10-CM | POA: Diagnosis present

## 2016-06-04 DIAGNOSIS — Z66 Do not resuscitate: Secondary | ICD-10-CM | POA: Diagnosis present

## 2016-06-04 DIAGNOSIS — Y95 Nosocomial condition: Secondary | ICD-10-CM | POA: Diagnosis present

## 2016-06-04 DIAGNOSIS — Z95810 Presence of automatic (implantable) cardiac defibrillator: Secondary | ICD-10-CM | POA: Diagnosis not present

## 2016-06-04 DIAGNOSIS — R4182 Altered mental status, unspecified: Secondary | ICD-10-CM | POA: Diagnosis present

## 2016-06-04 LAB — COMPREHENSIVE METABOLIC PANEL
ALK PHOS: 65 U/L (ref 38–126)
ALT: 36 U/L (ref 17–63)
ANION GAP: 4 — AB (ref 5–15)
AST: 42 U/L — ABNORMAL HIGH (ref 15–41)
Albumin: 2.4 g/dL — ABNORMAL LOW (ref 3.5–5.0)
BILIRUBIN TOTAL: 0.5 mg/dL (ref 0.3–1.2)
BUN: 32 mg/dL — ABNORMAL HIGH (ref 6–20)
CALCIUM: 8.6 mg/dL — AB (ref 8.9–10.3)
CO2: 23 mmol/L (ref 22–32)
Chloride: 108 mmol/L (ref 101–111)
Creatinine, Ser: 1.75 mg/dL — ABNORMAL HIGH (ref 0.61–1.24)
GFR, EST AFRICAN AMERICAN: 46 mL/min — AB (ref >60)
GFR, EST NON AFRICAN AMERICAN: 39 mL/min — AB (ref >60)
Glucose, Bld: 119 mg/dL — ABNORMAL HIGH (ref 65–99)
Potassium: 4.3 mmol/L (ref 3.5–5.1)
SODIUM: 135 mmol/L (ref 135–145)
TOTAL PROTEIN: 6.7 g/dL (ref 6.5–8.1)

## 2016-06-04 LAB — GLUCOSE, CAPILLARY
GLUCOSE-CAPILLARY: 92 mg/dL (ref 65–99)
Glucose-Capillary: 94 mg/dL (ref 65–99)
Glucose-Capillary: 77 mg/dL (ref 65–99)
Glucose-Capillary: 69 mg/dL (ref 65–99)

## 2016-06-04 LAB — URINALYSIS, ROUTINE W REFLEX MICROSCOPIC
BACTERIA UA: NONE SEEN
BILIRUBIN URINE: NEGATIVE
Glucose, UA: NEGATIVE mg/dL
KETONES UR: NEGATIVE mg/dL
LEUKOCYTES UA: NEGATIVE
Nitrite: NEGATIVE
Protein, ur: 100 mg/dL — AB
Specific Gravity, Urine: 1.012 (ref 1.005–1.030)
pH: 5 (ref 5.0–8.0)

## 2016-06-04 LAB — TSH: TSH: 0.009 u[IU]/mL — AB (ref 0.350–4.500)

## 2016-06-04 LAB — INFLUENZA PANEL BY PCR (TYPE A & B)
INFLAPCR: NEGATIVE
INFLBPCR: NEGATIVE

## 2016-06-04 LAB — TROPONIN I
TROPONIN I: 0.26 ng/mL — AB (ref <0.03)
TROPONIN I: 0.29 ng/mL — AB (ref <0.03)
Troponin I: 0.25 ng/mL (ref <0.03)
Troponin I: 0.27 ng/mL (ref <0.03)

## 2016-06-04 LAB — DIGOXIN LEVEL: Digoxin Level: 0.3 ng/mL — ABNORMAL LOW (ref 0.8–2.0)

## 2016-06-04 MED ORDER — VANCOMYCIN HCL IN DEXTROSE 750-5 MG/150ML-% IV SOLN
750.0000 mg | INTRAVENOUS | Status: DC
  Administered 2016-06-04 – 2016-06-07 (×4): 750 mg via INTRAVENOUS
  Filled 2016-06-04 (×5): qty 150

## 2016-06-04 MED ORDER — ASPIRIN EC 81 MG PO TBEC
81.0000 mg | DELAYED_RELEASE_TABLET | Freq: Every day | ORAL | Status: DC
  Administered 2016-06-04 – 2016-06-08 (×5): 81 mg via ORAL
  Filled 2016-06-04 (×5): qty 1

## 2016-06-04 MED ORDER — DEXTROSE 5 % IV SOLN
2.0000 g | INTRAVENOUS | Status: DC
  Filled 2016-06-04 (×2): qty 2

## 2016-06-04 MED ORDER — VANCOMYCIN HCL IN DEXTROSE 1-5 GM/200ML-% IV SOLN
1000.0000 mg | Freq: Once | INTRAVENOUS | Status: AC
  Administered 2016-06-04: 1000 mg via INTRAVENOUS
  Filled 2016-06-04: qty 200

## 2016-06-04 MED ORDER — FUROSEMIDE 40 MG PO TABS
40.0000 mg | ORAL_TABLET | Freq: Every day | ORAL | Status: DC
  Administered 2016-06-04 – 2016-06-08 (×5): 40 mg via ORAL
  Filled 2016-06-04 (×5): qty 1

## 2016-06-04 MED ORDER — HEPARIN SODIUM (PORCINE) 5000 UNIT/ML IJ SOLN
5000.0000 [IU] | Freq: Three times a day (TID) | INTRAMUSCULAR | Status: DC
  Administered 2016-06-04 – 2016-06-08 (×12): 5000 [IU] via SUBCUTANEOUS
  Filled 2016-06-04 (×11): qty 1

## 2016-06-04 MED ORDER — MUPIROCIN 2 % EX OINT
1.0000 "application " | TOPICAL_OINTMENT | Freq: Two times a day (BID) | CUTANEOUS | Status: DC
  Administered 2016-06-04 – 2016-06-08 (×8): 1 via NASAL
  Filled 2016-06-04 (×3): qty 22

## 2016-06-04 MED ORDER — DIGOXIN 125 MCG PO TABS
62.5000 ug | ORAL_TABLET | Freq: Every day | ORAL | Status: DC
  Administered 2016-06-04 – 2016-06-08 (×5): 62.5 ug via ORAL
  Filled 2016-06-04 (×5): qty 1

## 2016-06-04 MED ORDER — ACETAMINOPHEN 650 MG RE SUPP: 650.0000 mg | Freq: Four times a day (QID) | RECTAL | Status: DC | PRN

## 2016-06-04 MED ORDER — ISOSORBIDE DINITRATE 20 MG PO TABS
40.0000 mg | ORAL_TABLET | Freq: Three times a day (TID) | ORAL | Status: DC
  Administered 2016-06-04 – 2016-06-08 (×13): 40 mg via ORAL
  Filled 2016-06-04 (×13): qty 2

## 2016-06-04 MED ORDER — MIRABEGRON ER 25 MG PO TB24
25.0000 mg | ORAL_TABLET | Freq: Every day | ORAL | Status: DC
  Administered 2016-06-04 – 2016-06-08 (×5): 25 mg via ORAL
  Filled 2016-06-04 (×5): qty 1

## 2016-06-04 MED ORDER — INSULIN ASPART 100 UNIT/ML ~~LOC~~ SOLN: 0.0000 [IU] | Freq: Every day | SUBCUTANEOUS | Status: DC

## 2016-06-04 MED ORDER — DEXTROSE 5 % IV SOLN
2.0000 g | Freq: Once | INTRAVENOUS | Status: AC
  Administered 2016-06-04: 2 g via INTRAVENOUS
  Filled 2016-06-04: qty 2

## 2016-06-04 MED ORDER — ACETAMINOPHEN 325 MG PO TABS: 650.0000 mg | ORAL_TABLET | Freq: Four times a day (QID) | ORAL | Status: DC | PRN

## 2016-06-04 MED ORDER — CARVEDILOL 12.5 MG PO TABS
12.5000 mg | ORAL_TABLET | Freq: Two times a day (BID) | ORAL | Status: DC
  Administered 2016-06-04 – 2016-06-08 (×9): 12.5 mg via ORAL
  Filled 2016-06-04 (×9): qty 1

## 2016-06-04 MED ORDER — PANTOPRAZOLE SODIUM 40 MG PO TBEC
40.0000 mg | DELAYED_RELEASE_TABLET | Freq: Every day | ORAL | Status: DC
  Administered 2016-06-04 – 2016-06-08 (×5): 40 mg via ORAL
  Filled 2016-06-04 (×5): qty 1

## 2016-06-04 MED ORDER — SODIUM CHLORIDE 0.9 % IV BOLUS (SEPSIS)
500.0000 mL | Freq: Once | INTRAVENOUS | Status: AC
  Administered 2016-06-04: 500 mL via INTRAVENOUS

## 2016-06-04 MED ORDER — ATORVASTATIN CALCIUM 40 MG PO TABS
80.0000 mg | ORAL_TABLET | Freq: Every day | ORAL | Status: DC
Start: 2016-06-04 — End: 2016-06-08
  Administered 2016-06-04 – 2016-06-08 (×5): 80 mg via ORAL
  Filled 2016-06-04 (×5): qty 2

## 2016-06-04 MED ORDER — DEXTROSE 5 % IV SOLN
2.0000 g | INTRAVENOUS | Status: DC
  Administered 2016-06-04 – 2016-06-07 (×4): 2 g via INTRAVENOUS
  Filled 2016-06-04 (×5): qty 2

## 2016-06-04 MED ORDER — INSULIN ASPART 100 UNIT/ML FLEXPEN: 1.0000 [IU] | PEN_INJECTOR | Freq: Every day | SUBCUTANEOUS | Status: DC

## 2016-06-04 MED ORDER — INSULIN ASPART 100 UNIT/ML ~~LOC~~ SOLN: 0.0000 [IU] | Freq: Three times a day (TID) | SUBCUTANEOUS | Status: DC

## 2016-06-04 NOTE — Progress Notes (Signed)
Pharmacy Antibiotic Note  MAXFIELD GILDERSLEEVE is a 66 y.o. male admitted on 06/03/2016 with pneumonia.  Pharmacy has been consulted for vancomycin and cefepime dosing. Cefepime 2 gm and vanc 1 gm given earlier today  Plan: Cont cefepime 2gm IV q24 hours Cont vancomycin 750 mg IV q24 hours F/u renal function, cultures and clinical course  Height: 5\' 2"  (157.5 cm) Weight: 126 lb 9.6 oz (57.4 kg) IBW/kg (Calculated) : 54.6  Temp (24hrs), Avg:100.2 F (37.9 C), Min:98.8 F (37.1 C), Max:102.9 F (39.4 C)   Recent Labs Lab 06/03/16 2326 06/03/16 2330  WBC  --  4.3  CREATININE  --  1.75*  LATICACIDVEN 0.77  --     Estimated Creatinine Clearance: 32.9 mL/min (by C-G formula based on SCr of 1.75 mg/dL (H)).    No Known Allergies  Antimicrobials this admission: cefepime 1/6 >>  vanc 1/6 >>   Thank you for allowing pharmacy to be a part of this patient's care.  Beverlee Nims 06/04/2016 7:35 AM

## 2016-06-04 NOTE — Progress Notes (Signed)
I did not see the patient today as he was already seen by the hospitalist. I have reviewed the chart. I will see the patient tomorrow morning.

## 2016-06-04 NOTE — Progress Notes (Signed)
Cardiac monitoring called to notify nurse that patient had a 20 beat run of v-tach.  Went to room to assess patient, patient sleeping in bed. Woke up patient, patient had no c/o of pain, no c/o of being sob or any other cardiac complaints.   Vitals stable and WNL.  Notified MD on call, no response at this time.  Patient is still stable, sleeping, with no complaints.  Will continue to monitor patient.

## 2016-06-04 NOTE — H&P (Signed)
History and Physical    Keith Young ZOX:[id number] DOB: 10-31-1950 DOA: 06/03/2016  PCP: [name], MD  Patient coming from: SNF.    Chief Complaint:  AMS.   HPI: Keith Young is an 66 y.o. male with hx of systolic CHF EF 81% s/p ICD, CKD3, HLD, NSVT, prior CVA, DM2, HTN, brought to the ER from SNF as he has been intermittently more confused these past few days.  He also complained of sputum productive coughs, but no chest pain, fever, or chills.  Evaluation in the ER showed CXR with PNA, no leukocytosis, troponin of 0.27, and Cr of 1.75.  In the ER, he was alert and orient, and conversed meaningfully.  He was given IV Van/Cefepime, and hospitalist was asked to admit him for AMS, likely due to HCAP.  Of note, his troponin has been chronically elevated.     ED Course:  See above.  Rewiew of Systems:  Constitutional: Negative for malaise, fever and chills. No significant weight loss or weight gain Eyes: Negative for eye pain, redness and discharge, diplopia, visual changes, or flashes of light. ENMT: Negative for ear pain, hoarseness, nasal congestion, sinus pressure and sore throat. No headaches; tinnitus, drooling, or problem swallowing. Cardiovascular: Negative for chest pain, palpitations, diaphoresis, dyspnea and peripheral edema. ; No orthopnea, PND Respiratory: Negative for hemoptysis, wheezing and stridor. No pleuritic chestpain. Gastrointestinal: Negative for diarrhea, constipation,  melena, blood in stool, hematemesis, jaundice and rectal bleeding.    Genitourinary: Negative for frequency, dysuria, incontinence,flank pain and hematuria; Musculoskeletal: Negative for back pain and neck pain. Negative for swelling and trauma.;  Skin: . Negative for pruritus, rash, abrasions, bruising and skin lesion.; ulcerations Neuro: Negative for headache, lightheadedness and neck stiffness. Negative for weakness, altered level of consciousness , altered mental status, extremity  weakness, burning feet, involuntary movement, seizure and syncope.  Psych: negative for anxiety, depression, insomnia, tearfulness, panic attacks, hallucinations, paranoia, suicidal or homicidal ideation    Past Medical History:  Diagnosis Date  . AICD (automatic cardioverter/defibrillator) present   . Alcoholism in remission ([place])   . Anemia   . Aortic insufficiency   . Arthritis   . At moderate risk for fall   . Bell's palsy   . Cardiac defibrillator in situ   . Cardiomyopathy ([place])   . Chronic systolic heart failure (HCC)    NYHA Class III  . CKD (chronic kidney disease) stage 3, GFR 30-59 ml/min   . COPD (chronic obstructive pulmonary disease) ([place])   . CVA (cerebral infarction) 01/2013  . Essential hypertension, benign   . History of stroke   . Hyperlipidemia   . Noncompliance with medication regimen   . Nonischemic cardiomyopathy (HCC)    EF 15-20%  . NSVT (nonsustained ventricular tachycardia) ([place])   . Numbness of right jaw    Had a stoke in 01/2013. Numbness is occasional, especially when trying to chew.  . Productive cough 08/2013   With brown sputum   . SOB (shortness of breath) on exertion 08/2013  . Stroke [facility])    weakness of right side from CVA  . Type 2 diabetes mellitus ([place])   . Uses hearing aid 2014   recently received new hearing aids    Past Surgical History:  Procedure Laterality Date  . BIOPSY N/A 04/10/2014   Procedure: GASTRIC BIOPSY;  Surgeon: Daneil Dolin, MD;  Location: [facility] ORS;  Service: Endoscopy;  Laterality: N/A;  . BIOPSY  10/12/2015   Procedure: BIOPSY;  Surgeon: Herbie Baltimore  Hilton Cork, MD;  Location: [facility] ENDO SUITE;  Service: Endoscopy;;  stomach bx's  . CARDIAC DEFIBRILLATOR PLACEMENT  11/12/12   [employer] Inogen MINI ICD implanted in [facility] [place] AVR per Dr Nelly Laurence note  . COLONOSCOPY WITH PROPOFOL N/A 04/10/2014   RMR: Colonic Diverticulosis  . ESOPHAGOGASTRODUODENOSCOPY (EGD)  WITH PROPOFOL N/A 04/10/2014   RMR: Mild erosive reflux esophagitis. Multiple antral polyps likely hyperplastic status post removal by hot snare cautery technique. Diffusely abnormal stomach status post gastric biopsy I suspect some of patients anemaia may be due to intermittent oozing from the stomach. It would be difficult and a risky proposition to attempt complete removal of all of his gastric polyps.  . ESOPHAGOGASTRODUODENOSCOPY (EGD) WITH PROPOFOL N/A 10/12/2015   Procedure: ESOPHAGOGASTRODUODENOSCOPY (EGD) WITH PROPOFOL;  Surgeon: Daneil Dolin, MD;  Location: [facility] ENDO [facility];  Service: Endoscopy;  Laterality: N/A;  [id number] - moved to 9:15  . POLYPECTOMY N/A 04/10/2014   Procedure: GASTRIC POLYPECTOMY;  Surgeon: Daneil Dolin, MD;  Location: [facility] ORS;  Service: Endoscopy;  Laterality: N/A;  . RIGHT HEART CATHETERIZATION N/A 02/06/2014   Procedure: RIGHT HEART CATH;  Surgeon: Larey Dresser, MD;  Location: [facility] CATH LAB;  Service: Cardiovascular;  Laterality: N/A;     reports that he quit smoking about 22 years ago. His smoking use included Cigarettes. He has never used smokeless tobacco. He reports that he drinks alcohol. He reports that he does not use drugs.  No Known Allergies  Family History  Problem Relation Age of Onset  . Kidney disease Mother   . Kidney disease Sister   . Colon cancer Neg Hx      Prior to Admission medications   Medication Sig Start Date End Date Taking? Authorizing Provider  aspirin 81 MG EC tablet Take 1 tablet (81 mg total) by mouth daily. 12/17/13  Yes Larey Dresser, MD  atorvastatin (LIPITOR) 80 MG tablet Take 80 mg by mouth daily.    Yes Historical Provider, MD  carvedilol (COREG) 25 MG tablet Take 12.5 mg by mouth 2 (two) times daily with a meal.    Yes Historical Provider, MD  Cholecalciferol (VITAMIN D3) 2000 UNITS TABS Take 2,000 mg by mouth daily.   Yes Historical Provider, MD  digoxin (LANOXIN) 0.125 MG tablet TAKE 1/2 TABLET BY MOUTH DAILY 03/15/16   Yes Jolaine Artist, MD  ferrous sulfate 325 (65 FE) MG tablet Take 325 mg by mouth daily with breakfast.   Yes Historical Provider, MD  furosemide (LASIX) 40 MG tablet Take 40 mg by mouth daily.   Yes Historical Provider, MD  isosorbide dinitrate (ISORDIL) 20 MG tablet Take 40 mg by mouth 3 (three) times daily.  09/29/15  Yes Historical Provider, MD  mirabegron ER (MYRBETRIQ) 25 MG TB24 tablet Take 25 mg by mouth daily.   Yes Historical Provider, MD  mupirocin ointment (BACTROBAN) 2 % Place 1 application into the nose 2 (two) times daily.   Yes Historical Provider, MD  NOVOLOG FLEXPEN 100 UNIT/ML FlexPen Inject 1-28 Units into the skin daily. Up to 28 units daily  By sliding scale 09/02/15  Yes Historical Provider, MD  pantoprazole (PROTONIX) 40 MG tablet TAKE ONE TABLET   BY MOUTH   DAILY 05/25/16  Yes Mahala Menghini, PA-C    Physical Exam: Vitals:   06/04/16 0120 06/04/16 0130 06/04/16 0136 06/04/16 0200  BP:  138/68 138/68 144/84  Pulse:  91 92  92  Resp:  20 21 17   Temp: 99.8 F (37.7 C)     TempSrc: Rectal     SpO2:  94% 95% 92%  Weight:      Height:          Constitutional: NAD, calm, comfortable Vitals:   06/04/16 0120 06/04/16 0130 06/04/16 0136 06/04/16 0200  BP:  138/68 138/68 144/84  Pulse:  91 92 92  Resp:  20 21 17   Temp: 99.8 F (37.7 C)     TempSrc: Rectal     SpO2:  94% 95% 92%  Weight:      Height:       Eyes: PERRL, lids and conjunctivae normal ENMT: Mucous membranes are moist. Posterior pharynx clear of any exudate or lesions.Normal dentition.  Neck: normal, supple, no masses, no thyromegaly Respiratory: clear to auscultation bilaterally, no wheezing, no crackles. Normal respiratory effort. No accessory muscle use.  Cardiovascular: Regular rate and rhythm, no murmurs / rubs / gallops. No extremity edema. 2+ pedal pulses. No carotid bruits.  Abdomen: no tenderness, no masses palpated. No hepatosplenomegaly. Bowel sounds positive.  Musculoskeletal: no  clubbing / cyanosis. No joint deformity upper and lower extremities. Good ROM, no contractures. Normal muscle tone.  Skin: no rashes, lesions, ulcers. No induration Neurologic: CN 2-12 grossly intact. Sensation intact, DTR normal. Strength 5/5 in all 4.  Psychiatric: Normal judgment and insight. Alert and oriented x 3. Normal mood.    Labs on Admission: I have personally reviewed following labs and imaging studies  CBC:  Recent Labs Lab 06/03/16 2330  WBC 4.3  NEUTROABS 3.1  HGB 10.2*  HCT 31.4*  MCV 89.5  PLT 440*   Basic Metabolic Panel:  Recent Labs Lab 06/03/16 2330  NA 135  K 4.3  CL 108  CO2 23  GLUCOSE 119*  BUN 32*  CREATININE 1.75*  CALCIUM 8.6*   GFR: Estimated Creatinine Clearance: 32.9 mL/min (by C-G formula based on SCr of 1.75 mg/dL (H)). Liver Function Tests:  Recent Labs Lab 06/03/16 2330  AST 42*  ALT 36  ALKPHOS 65  BILITOT 0.5  PROT 6.7  ALBUMIN 2.4*   Cardiac Enzymes:  Recent Labs Lab 06/03/16 2330  TROPONINI 0.27*   CBG:  Recent Labs Lab 06/03/16 2232  GLUCAP 123*   Lipid Profile: Urine analysis:    Component Value Date/Time   COLORURINE YELLOW 09/22/2015 1000   APPEARANCEUR CLEAR 09/22/2015 1000   LABSPEC 1.020 09/22/2015 1000   PHURINE 5.5 09/22/2015 1000   GLUCOSEU NEGATIVE 09/22/2015 1000   HGBUR NEGATIVE 09/22/2015 1000   BILIRUBINUR NEGATIVE 09/22/2015 1000   KETONESUR NEGATIVE 09/22/2015 1000   PROTEINUR 100 (A) 09/22/2015 1000   UROBILINOGEN 0.2 10/23/2013 1545   NITRITE NEGATIVE 09/22/2015 1000   LEUKOCYTESUR NEGATIVE 09/22/2015 1000    Recent Results (from the past 240 hour(s))  Blood culture (routine x 2)     Status: None (Preliminary result)   Collection Time: 06/03/16 11:52 PM  Result Value Ref Range Status   Specimen Description RIGHT ANTECUBITAL  Final   Special Requests BOTTLES DRAWN AEROBIC AND ANAEROBIC [facility] EACH  Final   Culture PENDING  Incomplete   Report Status PENDING  Incomplete  Blood  culture (routine x 2)     Status: None (Preliminary result)   Collection Time: 06/03/16 11:58 PM  Result Value Ref Range Status   Specimen Description BLOOD RIGHT ARM  Final   Special Requests BOTTLES DRAWN AEROBIC AND ANAEROBIC [place]  Final  Culture PENDING  Incomplete   Report Status PENDING  Incomplete     Radiological Exams on Admission: Dg Chest 2 View  Result Date: 06/04/2016 CLINICAL DATA:  Altered mental status.  Fever. EXAM: CHEST  2 VIEW COMPARISON:  04/28/2015 FINDINGS: There is unchanged moderate cardiomegaly. 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 There are intact appearances of the transvenous cardiac lead. Patchy opacity in the retrocardiac left base could represent early infectious infiltrate. Right lung is clear. No pleural effusions. IMPRESSION: Patchy left base opacity, possibly infectious. Unchanged cardiomegaly. Electronically Signed   By: Andreas Newport M.D.   On: 06/04/2016 00:04    EKG: Independently reviewed.   Assessment/Plan Principal Problem:   HCAP (healthcare-associated pneumonia) Active Problems:   CKD (chronic kidney disease)   Essential hypertension, benign   Acute on chronic systolic heart failure (HCC)    PLAN:   AMS:  Mild, and could be from his PNA.  He is alert and orient now and seems at baseline.  Sister at bedside confirmed this.   HCAP:  Will continue with IV Van/Cefepime.   CKD:  Stable.  Current Cr is 1.75.   HTN:  Cotninue with his meds.  SBP is 140 when I saw him.  Chronic systolic CHF:  S/p ICD placement.  Continue with meds.     DVT prophylaxis: SUBQ Hep.  Code Status: DNR  ( This was confirmed tonight--Sister agreed) Family Communication: sister at bedside.  Disposition Plan: SNF when appropriate.  Consults called: None. Admission status: Inpatient.    Keith Basso MD FACP. [facility]  If 7PM-7AM, please contact night-coverage [email or web address] Password [email or web address]  06/04/2016, 2:13 AM

## 2016-06-04 NOTE — ED Notes (Signed)
CRITICAL VALUE ALERT  Critical value received:  Troponin 0.27 mg/dl  Date of notification:  06/04/16  Time of notification:  0018 hrs  Critical value read back:Yes.    Nurse who received alert:  Y. Shamicka Inga, RN   Responding MD:  Dr. Wyvonnia Dusky  Time MD responded:  0019 hrs

## 2016-06-05 LAB — GLUCOSE, CAPILLARY
GLUCOSE-CAPILLARY: 64 mg/dL — AB (ref 65–99)
GLUCOSE-CAPILLARY: 92 mg/dL (ref 65–99)
GLUCOSE-CAPILLARY: 132 mg/dL — AB (ref 65–99)
Glucose-Capillary: 74 mg/dL (ref 65–99)
Glucose-Capillary: 98 mg/dL (ref 65–99)
Glucose-Capillary: 75 mg/dL (ref 65–99)

## 2016-06-05 LAB — URINE CULTURE: CULTURE: NO GROWTH

## 2016-06-05 LAB — CBC
HCT: 33.2 % — ABNORMAL LOW (ref 39.0–52.0)
Hemoglobin: 10.6 g/dL — ABNORMAL LOW (ref 13.0–17.0)
MCH: 28.5 pg (ref 26.0–34.0)
MCHC: 31.9 g/dL (ref 30.0–36.0)
MCV: 89.2 fL (ref 78.0–100.0)
PLATELETS: 124 10*3/uL — AB (ref 150–400)
RBC: 3.72 MIL/uL — ABNORMAL LOW (ref 4.22–5.81)
RDW: 13.1 % (ref 11.5–15.5)
WBC: 2.9 10*3/uL — ABNORMAL LOW (ref 4.0–10.5)

## 2016-06-05 LAB — COMPREHENSIVE METABOLIC PANEL
ALBUMIN: 2.3 g/dL — AB (ref 3.5–5.0)
ALT: 29 U/L (ref 17–63)
ANION GAP: 4 — AB (ref 5–15)
AST: 35 U/L (ref 15–41)
Alkaline Phosphatase: 61 U/L (ref 38–126)
BUN: 27 mg/dL — ABNORMAL HIGH (ref 6–20)
CHLORIDE: 110 mmol/L (ref 101–111)
CO2: 24 mmol/L (ref 22–32)
Calcium: 8.7 mg/dL — ABNORMAL LOW (ref 8.9–10.3)
Creatinine, Ser: 1.5 mg/dL — ABNORMAL HIGH (ref 0.61–1.24)
GFR calc non Af Amer: 47 mL/min — ABNORMAL LOW (ref >60)
GFR, EST AFRICAN AMERICAN: 55 mL/min — AB (ref >60)
Glucose, Bld: 74 mg/dL (ref 65–99)
POTASSIUM: 4.2 mmol/L (ref 3.5–5.1)
SODIUM: 138 mmol/L (ref 135–145)
Total Bilirubin: 0.6 mg/dL (ref 0.3–1.2)
Total Protein: 6.9 g/dL (ref 6.5–8.1)

## 2016-06-05 NOTE — Progress Notes (Signed)
Patient's family is requesting for the patient to seen an ENT. On-Call MD notified, unsure if an ENT would be available on campus, recommended an outpatient follow up from attending before discharge.   Celestia Khat, RN

## 2016-06-05 NOTE — Progress Notes (Signed)
Keith Young EPP:[id number] DOB: February 06, 1951 DOA: 06/03/2016 PCP: [name], MD   Subjective: This man does not have any complaints. He's appears to be somewhat confused. He was confused on admission. He does have a previous history of cerebrovascular disease and also reduced ejection fraction.          Physical Exam: Blood pressure 130/62, pulse 93, temperature 98.7 F (37.1 C), temperature source Oral, resp. rate 19, height 5\' 2"  (1.575 m), weight 57.4 kg (126 lb 9.6 oz), SpO2 100 %. He is hemodynamic stable. Saturations adequate on room air. Heart sounds present with a systolic murmur 3/6. He is known to have aortic insufficiency. Lung fields clinically clear although there are a few crackles at the bases. He is alert but I don't think he is orientated in place and time.   Investigations:  Recent Results (from the past 240 hour(s))  Blood culture (routine x 2)     Status: None (Preliminary result)   Collection Time: 06/03/16 11:52 PM  Result Value Ref Range Status   Specimen Description RIGHT ANTECUBITAL  Final   Special Requests BOTTLES DRAWN AEROBIC AND ANAEROBIC 6CC EACH  Final   Culture NO GROWTH 1 DAY  Final   Report Status PENDING  Incomplete  Blood culture (routine x 2)     Status: None (Preliminary result)   Collection Time: 06/03/16 11:58 PM  Result Value Ref Range Status   Specimen Description BLOOD RIGHT ARM  Final   Special Requests BOTTLES DRAWN AEROBIC AND ANAEROBIC [facility] EACH  Final   Culture NO GROWTH 1 DAY  Final   Report Status PENDING  Incomplete     Basic Metabolic Panel:  Recent Labs  06/03/16 2330 06/05/16 0602  NA 135 138  K 4.3 4.2  CL 108 110  CO2 23 24  GLUCOSE 119* 74  BUN 32* 27*  CREATININE 1.75* 1.50*  CALCIUM 8.6* 8.7*   Liver Function Tests:  Recent Labs  06/03/16 2330 06/05/16 0602  AST 42* 35  ALT 36 29  ALKPHOS 65 61  BILITOT 0.5 0.6  PROT 6.7 6.9  ALBUMIN 2.4* 2.3*     CBC:  Recent Labs   06/03/16 2330 06/05/16 0602  WBC 4.3 2.9*  NEUTROABS 3.1  --   HGB 10.2* 10.6*  HCT 31.4* 33.2*  MCV 89.5 89.2  PLT 124* 124*    Dg Chest 2 View  Result Date: 06/04/2016 CLINICAL DATA:  Altered mental status.  Fever. EXAM: CHEST  2 VIEW COMPARISON:  04/28/2015 FINDINGS: There is unchanged moderate cardiomegaly. 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 There are intact appearances of the transvenous cardiac lead. Patchy opacity in the retrocardiac left base could represent early infectious infiltrate. Right lung is clear. No pleural effusions. IMPRESSION: Patchy left base opacity, possibly infectious. Unchanged cardiomegaly. Electronically Signed   By: Andreas Newport M.D.   On: 06/04/2016 00:04      Medications: I have reviewed the patient's current medications.  Impression: 1. Healthcare associated pneumonia, left lower lobe. 2. Chronic kidney disease, somewhat improved. 3. Hypertension. 4. Acute on chronic systolic heart failure. Compensated. 5. Leukopenia. 6. Chronically elevated troponin level.     Plan: 1. Continue with current therapy. 2. Monitor CBC. 3. Consider a CT brain scan if confusion does not clear up.  Consultants:  None.   Procedures:  None.   Antibiotics:  Maxipime.  Vancomycin.                   Code Status:  DO NOT RESUSCITATE.  Family Communication: No family members present.   Disposition Plan: Depending on progress.  Time spent: 15 minutes.   LOS: 1 day   Marianne C   06/05/2016, 9:35 AM

## 2016-06-05 NOTE — Progress Notes (Signed)
Patient's blood sugar was 64. Juice and graham crackers given to patient. Will reassess blood sugar.  Celestia Khat, RN

## 2016-06-06 LAB — GLUCOSE, CAPILLARY
GLUCOSE-CAPILLARY: 76 mg/dL (ref 65–99)
GLUCOSE-CAPILLARY: 137 mg/dL — AB (ref 65–99)
Glucose-Capillary: 98 mg/dL (ref 65–99)
Glucose-Capillary: 79 mg/dL (ref 65–99)

## 2016-06-06 NOTE — Care Management Important Message (Signed)
Important Message  Patient Details  Name: Keith Young MRN: [id number] Date of Birth: [birth year]   Medicare Important Message Given:  Yes    Shanetta Nicolls, Chauncey Reading, RN 06/06/2016, 12:40 PM

## 2016-06-06 NOTE — Care Management Note (Signed)
Case Management Note  Patient Details  Name: Keith Young MRN: [id number] Date of Birth: [birth year]  Expected Discharge Date:       06/06/2016           Expected Discharge Plan:  Assisted Living / Rest Home  In-House Referral:  Clinical Social Work  Discharge planning Services  CM Consult  Post Acute Care Choice:    Choice offered to:     DME Arranged:    DME Agency:     HH Arranged:    [place] Agency:     Status of Service:  Completed, signed off  If discussed at [employer], dates discussed:    Additional Comments: Patient is from [facility]. Owner present during assessment, states that they have a nurse at the group home and patient can return back to [facility] at time of discharge. CSW aware of admissoin and will make arrangements for return.   Kristol Almanzar, Chauncey Reading, RN 06/06/2016, 12:33 PM

## 2016-06-06 NOTE — Progress Notes (Signed)
Subjective: He was admitted with healthcare associated pneumonia. It is noted that his family request ENT evaluation but we did not have that available inpatient. He says he feels well. He has no complaints. No significant shortness of breath but he's not been up. No chest pain. No nausea vomiting diarrhea.  Objective: Vital signs in last 24 hours: Temp:  [98.1 F (36.7 C)-99.1 F (37.3 C)] 99.1 F (37.3 C) (01/08 0454) Pulse Rate:  [82-87] 82 (01/08 0454) Resp:  [20] 20 (01/08 0454) BP: (146-158)/(81-84) 158/83 (01/08 0454) SpO2:  [99 %-100 %] 99 % (01/08 0454) Weight change:  Last BM Date: 06/04/16  Intake/Output from previous day: 01/07 0701 - 01/08 0700 In: 360 [P.O.:360] Out: 700 [Urine:700]  PHYSICAL EXAM General appearance: alert, cooperative and no distress Resp: rhonchi bilaterally Cardio: Heart is regular with a 3/6 systolic murmur. GI: soft, non-tender; bowel sounds normal; no masses,  no organomegaly Extremities: 1+ edema bilaterally Skin warm and dry. Mucous membranes are moist.  Lab Results:  Results for orders placed or performed during the hospital encounter of 06/03/16 (from the past 48 hour(s))  Glucose, capillary     Status: None   Collection Time: 06/04/16 11:55 AM  Result Value Ref Range   Glucose-Capillary 77 65 - 99 mg/dL   Comment 1 Notify RN    Comment 2 Document in Chart   Troponin I     Status: Abnormal   Collection Time: 06/04/16  2:35 PM  Result Value Ref Range   Troponin I 0.25 (HH) <0.03 ng/mL    Comment: CRITICAL VALUE NOTED.  VALUE IS CONSISTENT WITH PREVIOUSLY REPORTED AND CALLED VALUE.  Urinalysis, Routine w reflex microscopic     Status: Abnormal   Collection Time: 06/04/16  3:52 PM  Result Value Ref Range   Color, Urine YELLOW YELLOW   APPearance CLEAR CLEAR   Specific Gravity, Urine 1.012 1.005 - 1.030   pH 5.0 5.0 - 8.0   Glucose, UA NEGATIVE NEGATIVE mg/dL   Hgb urine dipstick SMALL (A) NEGATIVE   Bilirubin Urine NEGATIVE  NEGATIVE   Ketones, ur NEGATIVE NEGATIVE mg/dL   Protein, ur 100 (A) NEGATIVE mg/dL   Nitrite NEGATIVE NEGATIVE   Leukocytes, UA NEGATIVE NEGATIVE   RBC / HPF 0-5 0 - 5 RBC/hpf   WBC, UA 0-5 0 - 5 WBC/hpf   Bacteria, UA NONE SEEN NONE SEEN   Mucous PRESENT   Urine culture     Status: None   Collection Time: 06/04/16  3:52 PM  Result Value Ref Range   Specimen Description URINE, CLEAN CATCH    Special Requests NONE    Culture NO GROWTH Performed at [facility]     Report Status 06/05/2016 FINAL   Glucose, capillary     Status: None   Collection Time: 06/04/16  4:10 PM  Result Value Ref Range   Glucose-Capillary 92 65 - 99 mg/dL  Glucose, capillary     Status: None   Collection Time: 06/04/16  8:21 PM  Result Value Ref Range   Glucose-Capillary 69 65 - 99 mg/dL  Glucose, capillary     Status: None   Collection Time: 06/05/16  4:36 AM  Result Value Ref Range   Glucose-Capillary 74 65 - 99 mg/dL  CBC     Status: Abnormal   Collection Time: 06/05/16  6:02 AM  Result Value Ref Range   WBC 2.9 (L) 4.0 - 10.5 K/uL   RBC 3.72 (L) 4.22 - 5.81 MIL/uL   Hemoglobin  10.6 (L) 13.0 - 17.0 g/dL   HCT 33.2 (L) 39.0 - 52.0 %   MCV 89.2 78.0 - 100.0 fL   MCH 28.5 26.0 - 34.0 pg   MCHC 31.9 30.0 - 36.0 g/dL   RDW 13.1 11.5 - 15.5 %   Platelets 124 (L) 150 - 400 K/uL  Comprehensive metabolic panel     Status: Abnormal   Collection Time: 06/05/16  6:02 AM  Result Value Ref Range   Sodium 138 135 - 145 mmol/L   Potassium 4.2 3.5 - 5.1 mmol/L   Chloride 110 101 - 111 mmol/L   CO2 24 22 - 32 mmol/L   Glucose, Bld 74 65 - 99 mg/dL   BUN 27 (H) 6 - 20 mg/dL   Creatinine, Ser 1.50 (H) 0.61 - 1.24 mg/dL   Calcium 8.7 (L) 8.9 - 10.3 mg/dL   Total Protein 6.9 6.5 - 8.1 g/dL   Albumin 2.3 (L) 3.5 - 5.0 g/dL   AST 35 15 - 41 U/L   ALT 29 17 - 63 U/L   Alkaline Phosphatase 61 38 - 126 U/L   Total Bilirubin 0.6 0.3 - 1.2 mg/dL   GFR calc non Af Amer 47 (L) >60 mL/min   GFR calc Af  Amer 55 (L) >60 mL/min    Comment: (NOTE) The eGFR has been calculated using the CKD EPI equation. This calculation has not been validated in all clinical situations. eGFR's persistently <60 mL/min signify possible Chronic Kidney Disease.    Anion gap 4 (L) 5 - 15  Glucose, capillary     Status: Abnormal   Collection Time: 06/05/16  7:33 AM  Result Value Ref Range   Glucose-Capillary 64 (L) 65 - 99 mg/dL   Comment 1 Notify RN    Comment 2 Document in Chart   Glucose, capillary     Status: None   Collection Time: 06/05/16  8:19 AM  Result Value Ref Range   Glucose-Capillary 92 65 - 99 mg/dL   Comment 1 Notify RN    Comment 2 Document in Chart   Glucose, capillary     Status: None   Collection Time: 06/05/16 11:17 AM  Result Value Ref Range   Glucose-Capillary 98 65 - 99 mg/dL   Comment 1 Notify RN    Comment 2 Document in Chart   Glucose, capillary     Status: None   Collection Time: 06/05/16  4:58 PM  Result Value Ref Range   Glucose-Capillary 75 65 - 99 mg/dL   Comment 1 Notify RN    Comment 2 Document in Chart   Glucose, capillary     Status: Abnormal   Collection Time: 06/05/16  9:36 PM  Result Value Ref Range   Glucose-Capillary 132 (H) 65 - 99 mg/dL   Comment 1 Notify RN    Comment 2 Document in Chart   Glucose, capillary     Status: None   Collection Time: 06/06/16  7:57 AM  Result Value Ref Range   Glucose-Capillary 76 65 - 99 mg/dL   Comment 1 Notify RN     ABGS No results for input(s): PHART, PO2ART, TCO2, HCO3 in the last 72 hours.  Invalid input(s): PCO2 CULTURES Recent Results (from the past 240 hour(s))  Blood culture (routine x 2)     Status: None (Preliminary result)   Collection Time: 06/03/16 11:52 PM  Result Value Ref Range Status   Specimen Description RIGHT ANTECUBITAL  Final   Special Requests BOTTLES DRAWN AEROBIC  AND ANAEROBIC 6CC EACH  Final   Culture NO GROWTH 1 DAY  Final   Report Status PENDING  Incomplete  Blood culture (routine x  2)     Status: None (Preliminary result)   Collection Time: 06/03/16 11:58 PM  Result Value Ref Range Status   Specimen Description BLOOD RIGHT ARM  Final   Special Requests BOTTLES DRAWN AEROBIC AND ANAEROBIC [place]  Final   Culture NO GROWTH 1 DAY  Final   Report Status PENDING  Incomplete  Urine culture     Status: None   Collection Time: 06/04/16  3:52 PM  Result Value Ref Range Status   Specimen Description URINE, CLEAN CATCH  Final   Special Requests NONE  Final   Culture NO GROWTH Performed at [facility]   Final   Report Status 06/05/2016 FINAL  Final   Studies/Results: No results found.  Medications:  Prior to Admission:  Prescriptions Prior to Admission  Medication Sig Dispense Refill Last Dose  . aspirin 81 MG EC tablet Take 1 tablet (81 mg total) by mouth daily.   06/03/2016 at [place]  . atorvastatin (LIPITOR) 80 MG tablet Take 80 mg by mouth daily.    06/03/2016 at [place]  . carvedilol (COREG) 25 MG tablet Take 12.5 mg by mouth 2 (two) times daily with a meal.    06/03/2016 at [facility]  . Cholecalciferol (VITAMIN D3) 2000 UNITS TABS Take 2,000 mg by mouth daily.   06/03/2016 at Unknown time  . digoxin (LANOXIN) 0.125 MG tablet TAKE 1/2 TABLET BY MOUTH DAILY 15 tablet 3 06/03/2016 at 800[facility]  . ferrous sulfate 325 (65 FE) MG tablet Take 325 mg by mouth daily with breakfast.   06/03/2016 at [facility]  . furosemide (LASIX) 40 MG tablet Take 40 mg by mouth daily.   06/03/2016 at [place]  . isosorbide dinitrate (ISORDIL) 20 MG tablet Take 40 mg by mouth 3 (three) times daily.    06/03/2016 at [address]  . mirabegron ER (MYRBETRIQ) 25 MG TB24 tablet Take 25 mg by mouth daily.   06/03/2016 at [place]  . mupirocin ointment (BACTROBAN) 2 % Place 1 application into the nose 2 (two) times daily.   06/03/2016 at [address]  . NOVOLOG FLEXPEN 100 UNIT/ML FlexPen Inject 1-28 Units into the skin daily. Up to 28 units daily  By sliding scale   unknown  . pantoprazole (PROTONIX) 40 MG tablet TAKE ONE TABLET   BY MOUTH   DAILY  30 tablet 11 06/03/2016 at [facility]   Scheduled: . aspirin EC  81 mg Oral Daily  . atorvastatin  80 mg Oral Daily  . carvedilol  12.5 mg Oral BID WC  . ceFEPime (MAXIPIME) IV  2 g Intravenous Q24H  . digoxin  62.5 mcg Oral Daily  . furosemide  40 mg Oral Daily  . heparin  5,000 Units Subcutaneous Q8H  . insulin aspart  0-15 Units Subcutaneous TID WC  . insulin aspart  0-5 Units Subcutaneous QHS  . isosorbide dinitrate  40 mg Oral TID  . mirabegron ER  25 mg Oral Daily  . mupirocin ointment  1 application Nasal BID  . pantoprazole  40 mg Oral Daily  . vancomycin  750 mg Intravenous Q24H   Continuous:  YTK:[id number] **OR** acetaminophen  Assesment: He was admitted with healthcare associated pneumonia. There appears to be some element of acute on chronic systolic heart failure. He has chronic kidney disease. He has hypertension which is well controlled. He has diabetes which  is doing okay. Principal Problem:   HCAP (healthcare-associated pneumonia) Active Problems:   CKD (chronic kidney disease)   Essential hypertension, benign   Acute on chronic systolic heart failure ([place])    Plan: Continue treatments. He may be able to be discharged tomorrow since he seems to have improved substantially. See if we can get him up and moving.    LOS: 2 days   Rutledge Selsor L 06/06/2016, 8:41 AM

## 2016-06-07 ENCOUNTER — Ambulatory Visit (HOSPITAL_COMMUNITY): Payer: Medicare HMO

## 2016-06-07 ENCOUNTER — Other Ambulatory Visit (HOSPITAL_COMMUNITY): Payer: Medicare HMO

## 2016-06-07 LAB — GLUCOSE, CAPILLARY
GLUCOSE-CAPILLARY: 87 mg/dL (ref 65–99)
GLUCOSE-CAPILLARY: 100 mg/dL — AB (ref 65–99)
GLUCOSE-CAPILLARY: 88 mg/dL (ref 65–99)
Glucose-Capillary: 86 mg/dL (ref 65–99)
Glucose-Capillary: 61 mg/dL — ABNORMAL LOW (ref 65–99)
Glucose-Capillary: 113 mg/dL — ABNORMAL HIGH (ref 65–99)

## 2016-06-07 MED ORDER — LEVOFLOXACIN 500 MG PO TABS: 500.0000 mg | ORAL_TABLET | Freq: Every day | ORAL | 0 refills | Status: DC

## 2016-06-07 NOTE — Progress Notes (Addendum)
Subjective: He says he feels well with no complaints. He wants to go home. He's not short of breath. He's not been up much. His appetite is not as good but he is eating no hemoptysis. No chest pain.  Objective: Vital signs in last 24 hours: Temp:  [99 F (37.2 C)-99.1 F (37.3 C)] 99 F (37.2 C) (01/09 0610) Pulse Rate:  [77-84] 84 (01/09 0610) Resp:  [15-20] 15 (01/09 0610) BP: (143-162)/(73-91) 154/80 (01/09 0610) SpO2:  [98 %-100 %] 100 % (01/09 0610) Weight change:  Last BM Date: 06/06/16  Intake/Output from previous day: 01/08 0701 - 01/09 0700 In: 9 [P.O.:410; IV Piggyback:400] Out: -   PHYSICAL EXAM General appearance: alert, cooperative and no distress Resp: Rhonchi bilaterally Cardio: regular rate and rhythm, S1, S2 normal, no murmur, click, rub or gallop GI: soft, non-tender; bowel sounds normal; no masses,  no organomegaly Extremities: extremities normal, atraumatic, no cyanosis or edema Skin warm and dry. Mucous membranes are moist  Lab Results:  Results for orders placed or performed during the hospital encounter of 06/03/16 (from the past 48 hour(s))  Glucose, capillary     Status: None   Collection Time: 06/05/16 11:17 AM  Result Value Ref Range   Glucose-Capillary 98 65 - 99 mg/dL   Comment 1 Notify RN    Comment 2 Document in Chart   Glucose, capillary     Status: None   Collection Time: 06/05/16  4:58 PM  Result Value Ref Range   Glucose-Capillary 75 65 - 99 mg/dL   Comment 1 Notify RN    Comment 2 Document in Chart   Glucose, capillary     Status: Abnormal   Collection Time: 06/05/16  9:36 PM  Result Value Ref Range   Glucose-Capillary 132 (H) 65 - 99 mg/dL   Comment 1 Notify RN    Comment 2 Document in Chart   Glucose, capillary     Status: None   Collection Time: 06/06/16  7:57 AM  Result Value Ref Range   Glucose-Capillary 76 65 - 99 mg/dL   Comment 1 Notify RN   Glucose, capillary     Status: None   Collection Time: 06/06/16 11:18 AM   Result Value Ref Range   Glucose-Capillary 98 65 - 99 mg/dL  Glucose, capillary     Status: None   Collection Time: 06/06/16  5:21 PM  Result Value Ref Range   Glucose-Capillary 79 65 - 99 mg/dL  Glucose, capillary     Status: Abnormal   Collection Time: 06/06/16  9:14 PM  Result Value Ref Range   Glucose-Capillary 137 (H) 65 - 99 mg/dL   Comment 1 Notify RN    Comment 2 Document in Chart   Glucose, capillary     Status: Abnormal   Collection Time: 06/07/16  7:35 AM  Result Value Ref Range   Glucose-Capillary 61 (L) 65 - 99 mg/dL   Comment 1 Notify RN     ABGS No results for input(s): PHART, PO2ART, TCO2, HCO3 in the last 72 hours.  Invalid input(s): PCO2 CULTURES Recent Results (from the past 240 hour(s))  Blood culture (routine x 2)     Status: None (Preliminary result)   Collection Time: 06/03/16 11:52 PM  Result Value Ref Range Status   Specimen Description RIGHT ANTECUBITAL  Final   Special Requests BOTTLES DRAWN AEROBIC AND ANAEROBIC [place]  Final   Culture NO GROWTH 3 DAYS  Final   Report Status PENDING  Incomplete  Blood  culture (routine x 2)     Status: None (Preliminary result)   Collection Time: 06/03/16 11:58 PM  Result Value Ref Range Status   Specimen Description BLOOD RIGHT ARM  Final   Special Requests BOTTLES DRAWN AEROBIC AND ANAEROBIC [place]  Final   Culture NO GROWTH 3 DAYS  Final   Report Status PENDING  Incomplete  Urine culture     Status: None   Collection Time: 06/04/16  3:52 PM  Result Value Ref Range Status   Specimen Description URINE, CLEAN CATCH  Final   Special Requests NONE  Final   Culture NO GROWTH Performed at [facility]   Final   Report Status 06/05/2016 FINAL  Final   Studies/Results: No results found.  Medications:  Prior to Admission:  Prescriptions Prior to Admission  Medication Sig Dispense Refill Last Dose  . aspirin 81 MG EC tablet Take 1 tablet (81 mg total) by mouth daily.   06/03/2016 at [place]  .  atorvastatin (LIPITOR) 80 MG tablet Take 80 mg by mouth daily.    06/03/2016 at [place]  . carvedilol (COREG) 25 MG tablet Take 12.5 mg by mouth 2 (two) times daily with a meal.    06/03/2016 at [address]  . Cholecalciferol (VITAMIN D3) 2000 UNITS TABS Take 2,000 mg by mouth daily.   06/03/2016 at Unknown time  . digoxin (LANOXIN) 0.125 MG tablet TAKE 1/2 TABLET BY MOUTH DAILY 15 tablet 3 06/03/2016 at 800[facility]  . ferrous sulfate 325 (65 FE) MG tablet Take 325 mg by mouth daily with breakfast.   06/03/2016 at [facility]  . furosemide (LASIX) 40 MG tablet Take 40 mg by mouth daily.   06/03/2016 at [place]  . isosorbide dinitrate (ISORDIL) 20 MG tablet Take 40 mg by mouth 3 (three) times daily.    06/03/2016 at 2000  . mirabegron ER (MYRBETRIQ) 25 MG TB24 tablet Take 25 mg by mouth daily.   06/03/2016 at [place]  . mupirocin ointment (BACTROBAN) 2 % Place 1 application into the nose 2 (two) times daily.   06/03/2016 at 2000  . NOVOLOG FLEXPEN 100 UNIT/ML FlexPen Inject 1-28 Units into the skin daily. Up to 28 units daily  By sliding scale   unknown  . pantoprazole (PROTONIX) 40 MG tablet TAKE ONE TABLET   BY MOUTH   DAILY 30 tablet 11 06/03/2016 at 800[facility]   Scheduled: . aspirin EC  81 mg Oral Daily  . atorvastatin  80 mg Oral Daily  . carvedilol  12.5 mg Oral BID WC  . ceFEPime (MAXIPIME) IV  2 g Intravenous Q24H  . digoxin  62.5 mcg Oral Daily  . furosemide  40 mg Oral Daily  . heparin  5,000 Units Subcutaneous Q8H  . insulin aspart  0-15 Units Subcutaneous TID WC  . insulin aspart  0-5 Units Subcutaneous QHS  . isosorbide dinitrate  40 mg Oral TID  . mirabegron ER  25 mg Oral Daily  . mupirocin ointment  1 application Nasal BID  . pantoprazole  40 mg Oral Daily  . vancomycin  750 mg Intravenous Q24H   Continuous:  FIE:[id number] **OR** acetaminophen  Assesment: He was admitted with healthcare associated pneumonia and he is markedly better. He wants to go home. He's not been up much so I'm going to have him get up and move  around and see how he does with eating today and if he does well with all that I may discharge him later today Principal Problem:   HCAP (  healthcare-associated pneumonia) Active Problems:   CKD (chronic kidney disease)   Essential hypertension, benign   Acute on chronic systolic heart failure ([place])    Plan: As above. His caretakers are concerned that he may need a higher level of care is ongoing to PT consultation to make sure that he is appropriate for his current assisted living facility.    LOS: 3 days   Gurfateh Mcclain L 06/07/2016, 9:21 AM

## 2016-06-07 NOTE — Care Management Note (Signed)
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)  DME Arranged:  [place] Rile rolling DME Agency:  [place]:  PT [place] Agency:     Status of Service:  Completed, signed off  If discussed at [place] of Stay Meetings, dates discussed:    Additional Comments: Patient recommended for HHPT. Patient agreeable. Offered choice of [place] agencies. Romualdo Bolk of [facility] notified and will obtain orders for [facility] and RW. 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 PT aware.   Joci Dress, Chauncey [place], RN 06/07/2016, 3:32 PM

## 2016-06-07 NOTE — Evaluation (Signed)
Physical Therapy Evaluation Patient Details Name: Keith Young MRN: [id number] DOB: 04-05-1951 Today's Date: 06/07/2016   History of Present Illness  66 y.o. male with hx of systolic CHF EF 67% s/p ICD, CKD3, HLD, NSVT, prior CVA, DM2, HTN, brought to the ER from a group home as he has been intermittently more confused these past few days.  He also complained of sputum productive coughs, but no chest pain, fever, or chills.  Evaluation in the ER showed CXR with PNA  Clinical Impression  Pt received in bed, and was agreeable to PT evaluation.  Pt expressed that he normally lives at a group home, but he is independent with ambulation, dressing, and bathing.  During PT evaluation he ambulated with the RW and Min guard, therefore, RW removed and pt ambulated without the RW.  At that point he was unsteady, therefore PT used HHA.  He ambulated a total of 552f with Min guard/Min A.  He is recommended to return home with HHPT, RNisswaand continued 24/7 supervision/assistance.      Follow Up Recommendations Home health PT;Supervision/Assistance - 24 hour    Equipment Recommendations  Rolling walker with 5" wheels    Recommendations for Other Services       Precautions / Restrictions Precautions Precautions: Fall Precaution Comments: due to immobility Restrictions Weight Bearing Restrictions: No      Mobility  Bed Mobility Overal bed mobility: Needs Assistance Bed Mobility: Supine to Sit     Supine to sit: Min guard     General bed mobility comments: assistance for motor planning with supine<>sit.   Transfers Overall transfer level: Needs assistance Equipment used: Rolling walker (2 wheeled);None Transfers: Sit to/from Stand Sit to Stand: Min guard            Ambulation/Gait Ambulation/Gait assistance: Min assist Ambulation Distance (Feet): 500 Feet Assistive device: Rolling walker (2 wheeled);1 person hand held assist Gait Pattern/deviations: Step-through pattern;Staggering  right;Staggering left     General Gait Details: Pt demonstrated good balance with RW, therefore, removed RW and and had pt ambulate with just Min guard.  Pt demonstrated unsteadiness and LOB, therefore pt given HHA x1 and pt demonstrated improved balance but still slightly unstable.  Therefore, recomend he continue to use RW.  Pt required vc's for directional turns.   Stairs            Wheelchair Mobility    Modified Rankin (Stroke Patients Only)       Balance Overall balance assessment: Needs assistance Sitting-balance support: Feet supported;Bilateral upper extremity supported Sitting balance-Leahy Scale: Good     Standing balance support: Single extremity supported Standing balance-Leahy Scale: Poor                               Pertinent Vitals/Pain Pain Assessment: No/denies pain    Home Living   Living Arrangements: Group Home Available Help at Discharge: Available 24 hours/day Type of Home: House Home Access: Level entry     Home Layout: One level        Prior Function Level of Independence: Independent   Gait / Transfers Assistance Needed: independent with ambulation.   ADL's / Homemaking Assistance Needed: independent with dressing and bathing.         Hand Dominance   Dominant Hand: Right    Extremity/Trunk Assessment   Upper Extremity Assessment Upper Extremity Assessment: Overall WFL for tasks assessed    Lower Extremity Assessment Lower Extremity  Assessment: Generalized weakness       Communication   Communication:  (slowed speech)  Cognition Arousal/Alertness: Awake/alert Behavior During Therapy: WFL for tasks assessed/performed Overall Cognitive Status: Impaired/Different from baseline Area of Impairment: Orientation Orientation Level: Situation;Time;Disoriented to                  General Comments      Exercises     Assessment/Plan    PT Assessment All further PT needs can be met in the next venue  of care  PT Problem List Decreased strength;Decreased activity tolerance;Decreased balance;Decreased mobility;Decreased knowledge of precautions;Decreased knowledge of use of DME;Decreased safety awareness          PT Treatment Interventions DME instruction;Gait training;Functional mobility training;Therapeutic activities;Therapeutic exercise;Balance training;Patient/family education    PT Goals (Current goals can be found in the Care Plan section)  Acute Rehab PT Goals Patient Stated Goal: To regain balance PT Goal Formulation: All assessment and education complete, DC therapy    Frequency     Barriers to discharge        Co-evaluation               End of Session Equipment Utilized During Treatment: Gait belt Activity Tolerance: Patient tolerated treatment well Patient left: in chair;with call bell/phone within reach;with chair alarm set Nurse Communication: Mobility status Lynn Ito, RN notified of pt's mobiltiy status.  Mobility sheet left hanging in the room.  )    Functional Assessment Tool Used: [employer]	[place] AM-PAC "6-clicks"  Functional Limitation: Mobility: Walking and moving around Mobility: Walking and Moving Around Current Status [phone]): At least 20 percent but less than 40 percent impaired, limited or restricted Mobility: Walking and Moving Around Goal Status [phone]): At least 20 percent but less than 40 percent impaired, limited or restricted Mobility: Walking and Moving Around Discharge Status [phone]): At least 20 percent but less than 40 percent impaired, limited or restricted    Time: 4270-6237 PT Time Calculation (min) (ACUTE ONLY): 26 min   Charges:   PT Evaluation $PT Eval Low Complexity: 1 Procedure PT Treatments $Gait Training: 8-22 mins   PT G Codes:   PT G-Codes **NOT FOR INPATIENT CLASS** Functional Assessment Tool Used: [employer] "6-clicks"  Functional Limitation: Mobility: Walking and moving around Mobility: Walking  and Moving Around Current Status [phone]): At least 20 percent but less than 40 percent impaired, limited or restricted Mobility: Walking and Moving Around Goal Status [phone]): At least 20 percent but less than 40 percent impaired, limited or restricted Mobility: Walking and Moving Around Discharge Status [phone]): At least 20 percent but less than 40 percent impaired, limited or restricted    Beth Kaitlin Ardito, PT, DPT X: [phone]

## 2016-06-08 ENCOUNTER — Ambulatory Visit (HOSPITAL_COMMUNITY): Payer: Medicare HMO

## 2016-06-08 LAB — GLUCOSE, CAPILLARY
GLUCOSE-CAPILLARY: 80 mg/dL (ref 65–99)
GLUCOSE-CAPILLARY: 92 mg/dL (ref 65–99)

## 2016-06-08 LAB — BASIC METABOLIC PANEL
Anion gap: 3 — ABNORMAL LOW (ref 5–15)
BUN: 25 mg/dL — AB (ref 6–20)
CHLORIDE: 106 mmol/L (ref 101–111)
CO2: 26 mmol/L (ref 22–32)
CREATININE: 1.62 mg/dL — AB (ref 0.61–1.24)
Calcium: 8.5 mg/dL — ABNORMAL LOW (ref 8.9–10.3)
GFR calc Af Amer: 50 mL/min — ABNORMAL LOW (ref >60)
GFR calc non Af Amer: 43 mL/min — ABNORMAL LOW (ref >60)
Glucose, Bld: 79 mg/dL (ref 65–99)
Potassium: 3.7 mmol/L (ref 3.5–5.1)
Sodium: 135 mmol/L (ref 135–145)

## 2016-06-08 MED ORDER — POTASSIUM CHLORIDE CRYS ER 20 MEQ PO TBCR
40.0000 meq | EXTENDED_RELEASE_TABLET | Freq: Once | ORAL | Status: AC
  Administered 2016-06-08: 40 meq via ORAL
  Filled 2016-06-08: qty 2

## 2016-06-08 NOTE — [place] FL2 (Signed)
[place] MEDICAID FL2 LEVEL OF CARE SCREENING TOOL     IDENTIFICATION  Patient Name: Keith Young Birthdate: September 23, 1950 Sex: male Admission Date (Current Location): 06/03/2016  [facility] Dept and [place] Number:  [employer] and Address:  [place] [address] [place]      Provider Number: [id number]  Attending Physician Name and Address:  Sinda Du, MD  Relative Name and Phone Number:       Current Level of Care: Hospital Recommended Level of Care: [place] Prior Approval Number:    Date Approved/Denied:   PASRR Number:    Discharge Plan: Domiciliary (Rest home)    Current Diagnoses: Patient Active Problem List   Diagnosis Date Noted  . HCAP (healthcare-associated pneumonia) 06/04/2016  . Mucosal abnormality of stomach   . Gastritis and gastroduodenitis 09/30/2015  . Heme positive stool 09/30/2015  . Pancytopenia ([place]) 09/30/2015  . Chest pain 04/28/2015  . Elevated troponin 04/28/2015  . ICD (implantable cardioverter-defibrillator) in place 01/22/2015  . Anemia 09/30/2014  . Gastric polyp   . Reflux esophagitis   . Elevated LFTs 03/26/2014  . Anemia in chronic kidney disease 03/26/2014  . Chronic systolic CHF (congestive heart failure) ([place]) 01/21/2014  . Carotid bruit 12/18/2013  . Acute on chronic systolic heart failure ([place]) 10/24/2013  . CHF (congestive heart failure), NYHA class III ([place]) 10/23/2013  . Cough 10/23/2013  . CKD (chronic kidney disease) 10/23/2013  . Type II or unspecified type diabetes mellitus without mention of complication, not stated as uncontrolled 10/23/2013  . Essential hypertension, benign 10/23/2013  . Hyperkalemia 10/23/2013  . CAD (coronary artery disease) 10/23/2013  . History of stroke 10/23/2013  . Anemia of chronic kidney failure 10/23/2013  . Other and unspecified hyperlipidemia 10/23/2013    Orientation RESPIRATION BLADDER Height & Weight     Self, Place  Normal  Incontinent Weight: 126 lb 9.6 oz (57.4 kg) Height:  5\' 2"  (157.5 cm)  BEHAVIORAL SYMPTOMS/MOOD NEUROLOGICAL BOWEL NUTRITION STATUS      Incontinent Diet (Diet Soft, low sodium, heart healthy)  AMBULATORY STATUS COMMUNICATION OF NEEDS Skin   Supervision Verbally Normal                       Personal Care Assistance Level of Assistance  Bathing, Feeding, Dressing Bathing Assistance: Limited assistance Feeding assistance: Independent Dressing Assistance: Limited assistance     Functional Limitations Info  Sight, Hearing, Speech Sight Info: Adequate Hearing Info: Adequate Speech Info: Adequate    SPECIAL CARE FACTORS FREQUENCY  PT (By licensed PT)     PT Frequency: 3x/week              Contractures Contractures Info: Present    Additional Factors Info  Code Status Code Status Info: DNR             Current Medications (06/08/2016):  This is the current hospital active medication list Current Facility-Administered Medications  Medication Dose Route Frequency Provider Last Rate Last Dose  . acetaminophen (TYLENOL) tablet 650 mg  650 mg Oral Q6H PRN Orvan Falconer, MD       Or  . acetaminophen (TYLENOL) suppository 650 mg  650 mg Rectal Q6H PRN Orvan Falconer, MD      . aspirin EC tablet 81 mg  81 mg Oral Daily Orvan Falconer, MD   81 mg at 06/07/16 0954  . atorvastatin (LIPITOR) tablet 80 mg  80 mg Oral Daily Orvan Falconer, MD  80 mg at 06/07/16 0954  . carvedilol (COREG) tablet 12.5 mg  12.5 mg Oral BID WC Orvan Falconer, MD   12.5 mg at 06/08/16 0906  . ceFEPIme (MAXIPIME) 2 g in dextrose 5 % 50 mL IVPB  2 g Intravenous Q24H Sinda Du, MD   2 g at 06/07/16 2118  . digoxin (LANOXIN) tablet 62.5 mcg  62.5 mcg Oral Daily Orvan Falconer, MD   62.5 mcg at 06/07/16 0954  . furosemide (LASIX) tablet 40 mg  40 mg Oral Daily Orvan Falconer, MD   40 mg at 06/07/16 0954  . heparin injection 5,000 Units  5,000 Units Subcutaneous Q8H Orvan Falconer, MD   5,000 Units at 06/08/16 0505  . insulin aspart (novoLOG)  injection 0-15 Units  0-15 Units Subcutaneous TID WC Orvan Falconer, MD      . insulin aspart (novoLOG) injection 0-5 Units  0-5 Units Subcutaneous QHS Orvan Falconer, MD      . isosorbide dinitrate (ISORDIL) tablet 40 mg  40 mg Oral TID Orvan Falconer, MD   40 mg at 06/07/16 2117  . mirabegron ER (MYRBETRIQ) tablet 25 mg  25 mg Oral Daily Orvan Falconer, MD   25 mg at 06/07/16 0954  . mupirocin ointment (BACTROBAN) 2 % 1 application  1 application Nasal BID Orvan Falconer, MD   1 application at 09/38/18 2132  . pantoprazole (PROTONIX) EC tablet 40 mg  40 mg Oral Daily Orvan Falconer, MD   40 mg at 06/07/16 0954  . vancomycin (VANCOCIN) IVPB 750 mg/150 ml premix  750 mg Intravenous Q24H Sinda Du, MD   750 mg at 06/07/16 2208     Discharge Medications: TAKE these medications          aspirin 81 MG EC tablet Take 1 tablet (81 mg total) by mouth daily.  atorvastatin 80 MG tablet Commonly known as:  LIPITOR Take 80 mg by mouth daily.  carvedilol 25 MG tablet Commonly known as:  COREG Take 12.5 mg by mouth 2 (two) times daily with a meal.  digoxin 0.125 MG tablet Commonly known as:  LANOXIN TAKE 1/2 TABLET BY MOUTH DAILY  ferrous sulfate 325 (65 FE) MG tablet Take 325 mg by mouth daily with breakfast.  furosemide 40 MG tablet Commonly known as:  LASIX Take 40 mg by mouth daily.  isosorbide dinitrate 20 MG tablet Commonly known as:  ISORDIL Take 40 mg by mouth 3 (three) times daily.  levofloxacin 500 MG tablet Commonly known as:  LEVAQUIN Take 1 tablet (500 mg total) by mouth daily.  losartan 25 MG tablet Commonly known as:  COZAAR Take 25 mg by mouth daily.  mupirocin ointment 2 % Commonly known as:  BACTROBAN Place 1 application into the nose 2 (two) times daily.  MYRBETRIQ 25 MG Tb24 tablet Generic drug:  mirabegron ER Take 25 mg by mouth daily.  NOVOLOG FLEXPEN 100 UNIT/ML FlexPen Generic drug:  insulin aspart Inject 1-28 Units into the skin daily. Up to 28 units daily  By sliding scale  pantoprazole  40 MG tablet Commonly known as:  PROTONIX TAKE ONE TABLET   BY MOUTH   DAILY  Vitamin D3 2000 units Tabs Take 2,000 mg by mouth daily.       Relevant Imaging Results:  Relevant Lab Results:   Additional Information    Maclane Holloran, Clydene Pugh, LCSW

## 2016-06-08 NOTE — Progress Notes (Signed)
Patient had a 4 beat run of V-tach, b/p 155/86,temp 98.9,resp 18, no c/o chest pain or discomfort noted. Dr Luan Pulling notified. Will continue to monitor patient.

## 2016-06-08 NOTE — Progress Notes (Signed)
11 beat run of V-tach.   Dr Luan Pulling paged.Marland Kitchen

## 2016-06-08 NOTE — Progress Notes (Signed)
Pt had 9 beats of V-Tach.  Md notified.

## 2016-06-08 NOTE — Progress Notes (Signed)
Discharged back to Assisted Living,caretaker received discharge packet.No c/o pain or discomfort noted. Staff accompanied patient to an awaiting vehicle.

## 2016-06-08 NOTE — Discharge Summary (Signed)
Physician Discharge Summary  Patient ID: Keith Young MRN: [id number] DOB/AGE: March 18, 1951 66 y.o. Primary Care Physician:Ahtziry Saathoff L, MD Admit date: 06/03/2016 Discharge date: 06/08/2016    Discharge Diagnoses:   Principal Problem:   HCAP (healthcare-associated pneumonia) Active Problems:   CKD (chronic kidney disease)   Essential hypertension, benign   Acute on chronic systolic heart failure (HCC) Asymptomatic ventricular tachycardia  Allergies as of 06/08/2016   No Known Allergies     Medication List    TAKE these medications   aspirin 81 MG EC tablet Take 1 tablet (81 mg total) by mouth daily.   atorvastatin 80 MG tablet Commonly known as:  LIPITOR Take 80 mg by mouth daily.   carvedilol 25 MG tablet Commonly known as:  COREG Take 12.5 mg by mouth 2 (two) times daily with a meal.   digoxin 0.125 MG tablet Commonly known as:  LANOXIN TAKE 1/2 TABLET BY MOUTH DAILY   ferrous sulfate 325 (65 FE) MG tablet Take 325 mg by mouth daily with breakfast.   furosemide 40 MG tablet Commonly known as:  LASIX Take 40 mg by mouth daily.   isosorbide dinitrate 20 MG tablet Commonly known as:  ISORDIL Take 40 mg by mouth 3 (three) times daily.   levofloxacin 500 MG tablet Commonly known as:  LEVAQUIN Take 1 tablet (500 mg total) by mouth daily.   losartan 25 MG tablet Commonly known as:  COZAAR Take 25 mg by mouth daily.   mupirocin ointment 2 % Commonly known as:  BACTROBAN Place 1 application into the nose 2 (two) times daily.   MYRBETRIQ 25 MG Tb24 tablet Generic drug:  mirabegron ER Take 25 mg by mouth daily.   NOVOLOG FLEXPEN 100 UNIT/ML FlexPen Generic drug:  insulin aspart Inject 1-28 Units into the skin daily. Up to 28 units daily  By sliding scale   pantoprazole 40 MG tablet Commonly known as:  PROTONIX TAKE ONE TABLET   BY MOUTH   DAILY   Vitamin D3 2000 units Tabs Take 2,000 mg by mouth daily.            Durable Medical Equipment        Start     Ordered   06/07/16 1536  For home use only DME Walker rolling  Once    Question:  Patient needs a walker to treat with the following condition  Answer:  Weakness   06/07/16 1535      Discharged Condition:Improved    Consults: None  Significant Diagnostic Studies: Dg Chest 2 View  Result Date: 06/04/2016 CLINICAL DATA:  Altered mental status.  Fever. EXAM: CHEST  2 VIEW COMPARISON:  04/28/2015 FINDINGS: There is unchanged moderate cardiomegaly. 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 There are intact appearances of the transvenous cardiac lead. Patchy opacity in the retrocardiac left base could represent early infectious infiltrate. Right lung is clear. No pleural effusions. IMPRESSION: Patchy left base opacity, possibly infectious. Unchanged cardiomegaly. Electronically Signed   By: Andreas Newport M.D.   On: 06/04/2016 00:04    Lab Results: Basic Metabolic Panel:  Recent Labs  06/08/16 0538  NA 135  K 3.7  CL 106  CO2 26  GLUCOSE 79  BUN 25*  CREATININE 1.62*  CALCIUM 8.5*   Liver Function Tests: No results for input(s): AST, ALT, ALKPHOS, BILITOT, PROT, ALBUMIN in the last 72 hours.   CBC: No results for input(s): WBC, NEUTROABS, HGB, HCT, MCV, PLT in the last 72 hours.  Recent Results (from  the past 240 hour(s))  Blood culture (routine x 2)     Status: None (Preliminary result)   Collection Time: 06/03/16 11:52 PM  Result Value Ref Range Status   Specimen Description RIGHT ANTECUBITAL  Final   Special Requests BOTTLES DRAWN AEROBIC AND ANAEROBIC [place]  Final   Culture NO GROWTH 3 DAYS  Final   Report Status PENDING  Incomplete  Blood culture (routine x 2)     Status: None (Preliminary result)   Collection Time: 06/03/16 11:58 PM  Result Value Ref Range Status   Specimen Description BLOOD RIGHT ARM  Final   Special Requests BOTTLES DRAWN AEROBIC AND ANAEROBIC [place]  Final   Culture NO GROWTH 3 DAYS  Final   Report Status PENDING   Incomplete  Urine culture     Status: None   Collection Time: 06/04/16  3:52 PM  Result Value Ref Range Status   Specimen Description URINE, CLEAN CATCH  Final   Special Requests NONE  Final   Culture NO GROWTH Performed at [facility]   Final   Report Status 06/05/2016 FINAL  Final     Hospital Course: This is a 66 year old came to the emergency department because of increasing shortness of breath. He was coughing and congested. Chest x-ray showed pneumonia. He was treated with intravenous antibiotics and improved. He was able to ambulate in the hall. No complaints of shortness of breath at the time of discharge. His cough resolved. He had asymptomatic V. tach during hospitalization.  Discharge Exam: Blood pressure (!) 155/86, pulse 86, temperature 98.9 F (37.2 C), temperature source Oral, resp. rate 18, height 5\' 2"  (1.575 m), weight 57.4 kg (126 lb 9.6 oz), SpO2 100 %. He's awake and alert. Chest is clear. Heart is regular. Abdomen is soft with no masses. No edema  Disposition: Back to assisted living facility with home health services  Discharge Instructions    Call MD for:  persistant nausea and vomiting    Complete by:  As directed    Call MD for:  temperature >100.4    Complete by:  As directed    Diet - low sodium heart healthy    Complete by:  As directed    Discharge instructions    Complete by:  As directed    Increase activity slowly   Increase activity slowly    Complete by:  As directed         Signed: Florance Paolillo L   06/08/2016, 8:50 AM

## 2016-06-08 NOTE — Progress Notes (Signed)
Subjective: He feels better. No complaints. He's had 2 runs of V. tach which are asymptomatic. He is recommended to have a walker by physical therapy. He has no shortness of breath now. He's not coughing. No hemoptysis. No chest pain. No PND or orthopnea.  Objective: Vital signs in last 24 hours: Temp:  [97.8 F (36.6 C)-98.9 F (37.2 C)] 98.9 F (37.2 C) (01/10 0753) Pulse Rate:  [74-86] 86 (01/10 0753) Resp:  [16-18] 18 (01/10 0753) BP: (133-155)/(74-86) 155/86 (01/10 0753) SpO2:  [95 %-100 %] 100 % (01/10 0753) Weight change:  Last BM Date: 06/07/16  Intake/Output from previous day: 01/09 0701 - 01/10 0700 In: 720 [P.O.:720] Out: 250 [Urine:250]  PHYSICAL EXAM General appearance: alert, cooperative and no distress Resp: clear to auscultation bilaterally Cardio: regular rate and rhythm, S1, S2 normal, no murmur, click, rub or gallop GI: soft, non-tender; bowel sounds normal; no masses,  no organomegaly Extremities: extremities normal, atraumatic, no cyanosis or edema Skin warm and dry. Pupils reactive. Mucous membranes are moist  Lab Results:  Results for orders placed or performed during the hospital encounter of 06/03/16 (from the past 48 hour(s))  Glucose, capillary     Status: None   Collection Time: 06/06/16 11:18 AM  Result Value Ref Range   Glucose-Capillary 98 65 - 99 mg/dL  Glucose, capillary     Status: None   Collection Time: 06/06/16  4:42 PM  Result Value Ref Range   Glucose-Capillary 86 65 - 99 mg/dL   Comment 1 Notify RN   Glucose, capillary     Status: None   Collection Time: 06/06/16  5:21 PM  Result Value Ref Range   Glucose-Capillary 79 65 - 99 mg/dL  Glucose, capillary     Status: Abnormal   Collection Time: 06/06/16  9:14 PM  Result Value Ref Range   Glucose-Capillary 137 (H) 65 - 99 mg/dL   Comment 1 Notify RN    Comment 2 Document in Chart   Glucose, capillary     Status: Abnormal   Collection Time: 06/07/16  7:35 AM  Result Value Ref  Range   Glucose-Capillary 61 (L) 65 - 99 mg/dL   Comment 1 Notify RN   Glucose, capillary     Status: None   Collection Time: 06/07/16  8:15 AM  Result Value Ref Range   Glucose-Capillary 87 65 - 99 mg/dL  Glucose, capillary     Status: Abnormal   Collection Time: 06/07/16 11:32 AM  Result Value Ref Range   Glucose-Capillary 100 (H) 65 - 99 mg/dL  Glucose, capillary     Status: None   Collection Time: 06/07/16  4:25 PM  Result Value Ref Range   Glucose-Capillary 88 65 - 99 mg/dL   Comment 1 Notify RN   Glucose, capillary     Status: Abnormal   Collection Time: 06/07/16  9:26 PM  Result Value Ref Range   Glucose-Capillary 113 (H) 65 - 99 mg/dL   Comment 1 Notify RN    Comment 2 Document in Chart   Basic metabolic panel     Status: Abnormal   Collection Time: 06/08/16  5:38 AM  Result Value Ref Range   Sodium 135 135 - 145 mmol/L   Potassium 3.7 3.5 - 5.1 mmol/L   Chloride 106 101 - 111 mmol/L   CO2 26 22 - 32 mmol/L   Glucose, Bld 79 65 - 99 mg/dL   BUN 25 (H) 6 - 20 mg/dL   Creatinine, Ser 1.62 (H)  0.61 - 1.24 mg/dL   Calcium 8.5 (L) 8.9 - 10.3 mg/dL   GFR calc non Af Amer 43 (L) >60 mL/min   GFR calc Af Amer 50 (L) >60 mL/min    Comment: (NOTE) The eGFR has been calculated using the CKD EPI equation. This calculation has not been validated in all clinical situations. eGFR's persistently <60 mL/min signify possible Chronic Kidney Disease.    Anion gap 3 (L) 5 - 15  Glucose, capillary     Status: None   Collection Time: 06/08/16  7:43 AM  Result Value Ref Range   Glucose-Capillary 80 65 - 99 mg/dL   Comment 1 Notify RN    Comment 2 Document in Chart     ABGS No results for input(s): PHART, PO2ART, TCO2, HCO3 in the last 72 hours.  Invalid input(s): PCO2 CULTURES Recent Results (from the past 240 hour(s))  Blood culture (routine x 2)     Status: None (Preliminary result)   Collection Time: 06/03/16 11:52 PM  Result Value Ref Range Status   Specimen Description  RIGHT ANTECUBITAL  Final   Special Requests BOTTLES DRAWN AEROBIC AND ANAEROBIC [place]  Final   Culture NO GROWTH 3 DAYS  Final   Report Status PENDING  Incomplete  Blood culture (routine x 2)     Status: None (Preliminary result)   Collection Time: 06/03/16 11:58 PM  Result Value Ref Range Status   Specimen Description BLOOD RIGHT ARM  Final   Special Requests BOTTLES DRAWN AEROBIC AND ANAEROBIC [place]  Final   Culture NO GROWTH 3 DAYS  Final   Report Status PENDING  Incomplete  Urine culture     Status: None   Collection Time: 06/04/16  3:52 PM  Result Value Ref Range Status   Specimen Description URINE, CLEAN CATCH  Final   Special Requests NONE  Final   Culture NO GROWTH Performed at [facility]   Final   Report Status 06/05/2016 FINAL  Final   Studies/Results: No results found.  Medications:  Prior to Admission:  Prescriptions Prior to Admission  Medication Sig Dispense Refill Last Dose  . aspirin 81 MG EC tablet Take 1 tablet (81 mg total) by mouth daily.   06/03/2016 at [place]  . atorvastatin (LIPITOR) 80 MG tablet Take 80 mg by mouth daily.    06/03/2016 at [place]  . carvedilol (COREG) 25 MG tablet Take 12.5 mg by mouth 2 (two) times daily with a meal.    06/03/2016 at 2000  . Cholecalciferol (VITAMIN D3) 2000 UNITS TABS Take 2,000 mg by mouth daily.   06/03/2016 at Unknown time  . digoxin (LANOXIN) 0.125 MG tablet TAKE 1/2 TABLET BY MOUTH DAILY 15 tablet 3 06/03/2016 at 800a  . ferrous sulfate 325 (65 FE) MG tablet Take 325 mg by mouth daily with breakfast.   06/03/2016 at 800a  . furosemide (LASIX) 40 MG tablet Take 40 mg by mouth daily.   06/03/2016 at [place]  . isosorbide dinitrate (ISORDIL) 20 MG tablet Take 40 mg by mouth 3 (three) times daily.    06/03/2016 at 2000  . losartan (COZAAR) 25 MG tablet Take 25 mg by mouth daily.     . mirabegron ER (MYRBETRIQ) 25 MG TB24 tablet Take 25 mg by mouth daily.   06/03/2016 at [place]  . mupirocin ointment (BACTROBAN) 2 % Place 1  application into the nose 2 (two) times daily.   06/03/2016 at 2000  . NOVOLOG FLEXPEN 100 UNIT/ML FlexPen Inject  1-28 Units into the skin daily. Up to 28 units daily  By sliding scale   unknown  . pantoprazole (PROTONIX) 40 MG tablet TAKE ONE TABLET   BY MOUTH   DAILY 30 tablet 11 06/03/2016 at 800a   Scheduled: . aspirin EC  81 mg Oral Daily  . atorvastatin  80 mg Oral Daily  . carvedilol  12.5 mg Oral BID WC  . ceFEPime (MAXIPIME) IV  2 g Intravenous Q24H  . digoxin  62.5 mcg Oral Daily  . furosemide  40 mg Oral Daily  . heparin  5,000 Units Subcutaneous Q8H  . insulin aspart  0-15 Units Subcutaneous TID WC  . insulin aspart  0-5 Units Subcutaneous QHS  . isosorbide dinitrate  40 mg Oral TID  . mirabegron ER  25 mg Oral Daily  . mupirocin ointment  1 application Nasal BID  . pantoprazole  40 mg Oral Daily  . potassium chloride  40 mEq Oral Once  . vancomycin  750 mg Intravenous Q24H   Continuous:  HQI:[id number] **OR** acetaminophen  Assesment: He was admitted with healthcare associated pneumonia. He is better. No symptoms now. He said asymptomatic V. tach in his potassium level is 3.7 this morning so on going to give him another dose of potassium. He does not have any symptoms and does not need any further workup. He has heart failure which is about the same. He has multiple other medical problems including a previous stroke. He is stable now and ready for discharge Principal Problem:   HCAP (healthcare-associated pneumonia) Active Problems:   CKD (chronic kidney disease)   Essential hypertension, benign   Acute on chronic systolic heart failure ([place])    Plan: Discharge home with home health services    LOS: 4 days   Wenona Mayville L 06/08/2016, 8:44 AM

## 2016-06-08 NOTE — Clinical Social Work Note (Signed)
Patient has been at [facility] for years.  CSW notified Rosezella [place] at [facility] that patient was discharging and would return to the facility.   CSW sent clinicals to facility.  CSW signing off.      Tamar Miano, Clydene Pugh, LCSW

## 2016-06-09 LAB — CULTURE, BLOOD (ROUTINE X 2)
CULTURE: NO GROWTH
Culture: NO GROWTH

## 2016-06-20 ENCOUNTER — Other Ambulatory Visit (HOSPITAL_COMMUNITY)
Admission: AD | Admit: 2016-06-20 | Discharge: 2016-06-20 | Disposition: A | Discharge status: Home / Self Care | Payer: Medicare HMO | Source: Skilled Nursing Facility | Attending: Pulmonary Disease | Admitting: Pulmonary Disease

## 2016-06-20 DIAGNOSIS — R195 Other fecal abnormalities: Secondary | ICD-10-CM | POA: Insufficient documentation

## 2016-06-20 LAB — CBC
HCT: 33.6 % — ABNORMAL LOW (ref 39.0–52.0)
HEMOGLOBIN: 10.7 g/dL — AB (ref 13.0–17.0)
MCH: 27.7 pg (ref 26.0–34.0)
MCHC: 31.8 g/dL (ref 30.0–36.0)
MCV: 87 fL (ref 78.0–100.0)
Platelets: 173 10*3/uL (ref 150–400)
RBC: 3.86 MIL/uL — ABNORMAL LOW (ref 4.22–5.81)
RDW: 12.9 % (ref 11.5–15.5)
WBC: 2.6 10*3/uL — ABNORMAL LOW (ref 4.0–10.5)

## 2016-06-20 LAB — OCCULT BLOOD X 1 CARD TO LAB, STOOL: FECAL OCCULT BLD: NEGATIVE

## 2016-06-21 ENCOUNTER — Encounter (HOSPITAL_COMMUNITY): Payer: Medicare HMO | Attending: Hematology & Oncology

## 2016-06-21 ENCOUNTER — Encounter (HOSPITAL_COMMUNITY): Payer: Medicare HMO

## 2016-06-21 VITALS — BP 143/80 | HR 90 | Temp 98.9°F | Resp 14

## 2016-06-21 DIAGNOSIS — N183 Chronic kidney disease, stage 3 unspecified: Secondary | ICD-10-CM

## 2016-06-21 DIAGNOSIS — D631 Anemia in chronic kidney disease: Secondary | ICD-10-CM

## 2016-06-21 LAB — CBC
HCT: 34 % — ABNORMAL LOW (ref 39.0–52.0)
Hemoglobin: 10.8 g/dL — ABNORMAL LOW (ref 13.0–17.0)
MCH: 27.8 pg (ref 26.0–34.0)
MCHC: 31.8 g/dL (ref 30.0–36.0)
MCV: 87.4 fL (ref 78.0–100.0)
PLATELETS: 142 10*3/uL — AB (ref 150–400)
RBC: 3.89 MIL/uL — AB (ref 4.22–5.81)
RDW: 12.9 % (ref 11.5–15.5)
WBC: 2.6 10*3/uL — ABNORMAL LOW (ref 4.0–10.5)

## 2016-06-21 MED ORDER — DARBEPOETIN ALFA 60 MCG/0.3ML IJ SOSY
50.0000 ug | PREFILLED_SYRINGE | Freq: Once | INTRAMUSCULAR | Status: AC
  Administered 2016-06-21: 50 ug via SUBCUTANEOUS

## 2016-06-21 MED ORDER — DARBEPOETIN ALFA 60 MCG/0.3ML IJ SOSY
PREFILLED_SYRINGE | INTRAMUSCULAR | Status: AC
Start: 2016-06-21 — End: 2016-06-21
  Filled 2016-06-21: qty 0.3

## 2016-06-21 NOTE — Progress Notes (Signed)
Keith Young presents today for injection per MD orders. Aranesp 50 mcg administered SQ in right Abdomen. Administration without incident. Patient tolerated well.

## 2016-06-21 NOTE — Patient Instructions (Signed)
[place] at [facility] Discharge Instructions  RECOMMENDATIONS MADE BY THE CONSULTANT AND ANY TEST RESULTS WILL BE SENT TO YOUR REFERRING PHYSICIAN.  Hemoglobin 10.8. Aranesp 50 mcg injection given as ordered. Return as scheduled.  Thank you for choosing [place] at [facility] to provide your oncology and hematology care.  To afford each patient quality time with our provider, please arrive at least 15 minutes before your scheduled appointment time.    If you have a lab appointment with the [facility] please come in thru the  Main Entrance and check in at the main information desk  You need to re-schedule your appointment should you arrive 10 or more minutes late.  We strive to give you quality time with our providers, and arriving late affects you and other patients whose appointments are after yours.  Also, if you no show three or more times for appointments you may be dismissed from the clinic at the providers discretion.     Again, thank you for choosing [facility].  Our hope is that these requests will decrease the amount of time that you wait before being seen by our physicians.       _____________________________________________________________  Should you have questions after your visit to [facility], please contact our office at [phone] between the hours of 8:30 a.m. and 4:30 p.m.  Voicemails left after 4:30 p.m. will not be returned until the following business day.  For prescription refill requests, have your pharmacy contact our office.       Resources For Cancer Patients and their Caregivers ? American Cancer Society: Can assist with transportation, wigs, general needs, runs Look Good Feel Better.        717-004-5749 ? Cancer Care: Provides financial assistance, online support groups, medication/co-pay assistance.  1-800-813-HOPE 337-849-7150) ? [facility] Assists  [place] cancer patients and their families through emotional , educational and financial support.  (806)821-6605 ? [place] DSS Where to apply for food stamps, Medicaid and utility assistance. (606) 781-7676 ? [facility]: Transportation to medical appointments. (215)798-0999 ? Social Security Administration: May apply for disability if have a Stage IV cancer. 639-143-9712 623-115-5134 ? [employer], Disability and Transit Services: Assists with nutrition, care and transit needs. [place] Support Programs: @10RELATIVEDAYS @ > Cancer Support Group  2nd Tuesday of the month 1pm-2pm, Journey Room  > Creative Journey  3rd Tuesday of the month 1130am-1pm, Journey Room  > Look Good Feel Better  1st Wednesday of the month 10am-12 noon, Journey Room (Call [facility] to register [phone])

## 2016-06-24 ENCOUNTER — Encounter: Payer: Self-pay | Admitting: Internal Medicine

## 2016-06-24 ENCOUNTER — Other Ambulatory Visit (HOSPITAL_COMMUNITY)
Admission: RE | Admit: 2016-06-24 | Discharge: 2016-06-24 | Disposition: A | Discharge status: Home / Self Care | Payer: Medicare HMO | Source: Other Acute Inpatient Hospital | Attending: Pulmonary Disease | Admitting: Pulmonary Disease

## 2016-06-24 ENCOUNTER — Telehealth: Payer: Self-pay | Admitting: Internal Medicine

## 2016-06-24 DIAGNOSIS — N390 Urinary tract infection, site not specified: Secondary | ICD-10-CM | POA: Insufficient documentation

## 2016-06-24 LAB — URINALYSIS, ROUTINE W REFLEX MICROSCOPIC
Bacteria, UA: NONE SEEN
Bilirubin Urine: NEGATIVE
Glucose, UA: NEGATIVE mg/dL
Ketones, ur: NEGATIVE mg/dL
LEUKOCYTES UA: NEGATIVE
Nitrite: NEGATIVE
PROTEIN: 100 mg/dL — AB
SPECIFIC GRAVITY, URINE: 1.01 (ref 1.005–1.030)
pH: 5 (ref 5.0–8.0)

## 2016-06-24 NOTE — Telephone Encounter (Signed)
Routing to LSL to review.

## 2016-06-24 NOTE — Telephone Encounter (Signed)
Keith Young had mailed a letter on 06/02/2016 that the patient was due for a 6 month follow up. Patient's caregiver faxed the letter back to [place] yesterday afternoon saying that the patient had a stool test with [facility] and no blood was found. He said our number was busy and to call him at [phone] if needed. Per LSL note patient is to follow up with [facility] and I made him an OV with [facility] for 2/9 at 10 am and mailed letter to the patient. Caregiver in the past has been difficult and says the patient is being treated for anemia at the cancer center twice a week and does not need to come here (please see past phone notes). Please advise if patient needs OV or not.

## 2016-06-24 NOTE — Telephone Encounter (Signed)
I would say to push his appt out to 09/2016 with RMR. RMR was planning on repeating EGD in 1-2 years and that would fall at 09/2016.

## 2016-06-26 ENCOUNTER — Emergency Department (HOSPITAL_COMMUNITY): Payer: Medicare HMO

## 2016-06-26 ENCOUNTER — Inpatient Hospital Stay (HOSPITAL_COMMUNITY)
Admission: EM | Admit: 2016-06-26 | Discharge: 2016-06-30 | DRG: 064 | Disposition: A | Discharge status: Skilled Nursing Facility | Payer: Medicare HMO | Attending: Pulmonary Disease | Admitting: Pulmonary Disease

## 2016-06-26 ENCOUNTER — Encounter (HOSPITAL_COMMUNITY): Payer: Self-pay | Admitting: Emergency Medicine

## 2016-06-26 ENCOUNTER — Other Ambulatory Visit: Payer: Self-pay

## 2016-06-26 DIAGNOSIS — Z95810 Presence of automatic (implantable) cardiac defibrillator: Secondary | ICD-10-CM | POA: Diagnosis not present

## 2016-06-26 DIAGNOSIS — J449 Chronic obstructive pulmonary disease, unspecified: Secondary | ICD-10-CM | POA: Diagnosis present

## 2016-06-26 DIAGNOSIS — Z841 Family history of disorders of kidney and ureter: Secondary | ICD-10-CM

## 2016-06-26 DIAGNOSIS — E11649 Type 2 diabetes mellitus with hypoglycemia without coma: Secondary | ICD-10-CM | POA: Diagnosis present

## 2016-06-26 DIAGNOSIS — M6281 Muscle weakness (generalized): Secondary | ICD-10-CM

## 2016-06-26 DIAGNOSIS — I6350 Cerebral infarction due to unspecified occlusion or stenosis of unspecified cerebral artery: Secondary | ICD-10-CM | POA: Diagnosis not present

## 2016-06-26 DIAGNOSIS — Z8673 Personal history of transient ischemic attack (TIA), and cerebral infarction without residual deficits: Secondary | ICD-10-CM

## 2016-06-26 DIAGNOSIS — I429 Cardiomyopathy, unspecified: Secondary | ICD-10-CM | POA: Diagnosis present

## 2016-06-26 DIAGNOSIS — I130 Hypertensive heart and chronic kidney disease with heart failure and stage 1 through stage 4 chronic kidney disease, or unspecified chronic kidney disease: Secondary | ICD-10-CM | POA: Diagnosis present

## 2016-06-26 DIAGNOSIS — E0590 Thyrotoxicosis, unspecified without thyrotoxic crisis or storm: Secondary | ICD-10-CM | POA: Diagnosis present

## 2016-06-26 DIAGNOSIS — I472 Ventricular tachycardia: Secondary | ICD-10-CM | POA: Diagnosis present

## 2016-06-26 DIAGNOSIS — F0390 Unspecified dementia without behavioral disturbance: Secondary | ICD-10-CM | POA: Diagnosis present

## 2016-06-26 DIAGNOSIS — F1021 Alcohol dependence, in remission: Secondary | ICD-10-CM | POA: Diagnosis present

## 2016-06-26 DIAGNOSIS — E440 Moderate protein-calorie malnutrition: Secondary | ICD-10-CM | POA: Insufficient documentation

## 2016-06-26 DIAGNOSIS — R7989 Other specified abnormal findings of blood chemistry: Secondary | ICD-10-CM | POA: Diagnosis present

## 2016-06-26 DIAGNOSIS — N189 Chronic kidney disease, unspecified: Secondary | ICD-10-CM

## 2016-06-26 DIAGNOSIS — N183 Chronic kidney disease, stage 3 unspecified: Secondary | ICD-10-CM | POA: Diagnosis present

## 2016-06-26 DIAGNOSIS — Z9114 Patient's other noncompliance with medication regimen: Secondary | ICD-10-CM

## 2016-06-26 DIAGNOSIS — Z8701 Personal history of pneumonia (recurrent): Secondary | ICD-10-CM

## 2016-06-26 DIAGNOSIS — R778 Other specified abnormalities of plasma proteins: Secondary | ICD-10-CM

## 2016-06-26 DIAGNOSIS — I639 Cerebral infarction, unspecified: Secondary | ICD-10-CM | POA: Diagnosis present

## 2016-06-26 DIAGNOSIS — D72819 Decreased white blood cell count, unspecified: Secondary | ICD-10-CM

## 2016-06-26 DIAGNOSIS — E1122 Type 2 diabetes mellitus with diabetic chronic kidney disease: Secondary | ICD-10-CM | POA: Diagnosis present

## 2016-06-26 DIAGNOSIS — E43 Unspecified severe protein-calorie malnutrition: Secondary | ICD-10-CM | POA: Diagnosis present

## 2016-06-26 DIAGNOSIS — D696 Thrombocytopenia, unspecified: Secondary | ICD-10-CM

## 2016-06-26 DIAGNOSIS — D61818 Other pancytopenia: Secondary | ICD-10-CM | POA: Diagnosis present

## 2016-06-26 DIAGNOSIS — N179 Acute kidney failure, unspecified: Secondary | ICD-10-CM | POA: Diagnosis present

## 2016-06-26 DIAGNOSIS — E871 Hypo-osmolality and hyponatremia: Secondary | ICD-10-CM | POA: Diagnosis present

## 2016-06-26 DIAGNOSIS — Z6820 Body mass index (BMI) 20.0-20.9, adult: Secondary | ICD-10-CM | POA: Diagnosis not present

## 2016-06-26 DIAGNOSIS — E119 Type 2 diabetes mellitus without complications: Secondary | ICD-10-CM | POA: Diagnosis not present

## 2016-06-26 DIAGNOSIS — D631 Anemia in chronic kidney disease: Secondary | ICD-10-CM | POA: Diagnosis present

## 2016-06-26 DIAGNOSIS — R32 Unspecified urinary incontinence: Secondary | ICD-10-CM | POA: Diagnosis present

## 2016-06-26 DIAGNOSIS — R4182 Altered mental status, unspecified: Secondary | ICD-10-CM | POA: Diagnosis not present

## 2016-06-26 DIAGNOSIS — R748 Abnormal levels of other serum enzymes: Secondary | ICD-10-CM | POA: Diagnosis present

## 2016-06-26 DIAGNOSIS — I951 Orthostatic hypotension: Secondary | ICD-10-CM

## 2016-06-26 DIAGNOSIS — G459 Transient cerebral ischemic attack, unspecified: Secondary | ICD-10-CM

## 2016-06-26 DIAGNOSIS — I5022 Chronic systolic (congestive) heart failure: Secondary | ICD-10-CM | POA: Diagnosis present

## 2016-06-26 DIAGNOSIS — G9349 Other encephalopathy: Secondary | ICD-10-CM | POA: Diagnosis present

## 2016-06-26 DIAGNOSIS — R260 Ataxic gait: Secondary | ICD-10-CM

## 2016-06-26 DIAGNOSIS — E785 Hyperlipidemia, unspecified: Secondary | ICD-10-CM | POA: Diagnosis present

## 2016-06-26 DIAGNOSIS — K7460 Unspecified cirrhosis of liver: Secondary | ICD-10-CM | POA: Diagnosis present

## 2016-06-26 DIAGNOSIS — Z87891 Personal history of nicotine dependence: Secondary | ICD-10-CM

## 2016-06-26 DIAGNOSIS — Z7982 Long term (current) use of aspirin: Secondary | ICD-10-CM

## 2016-06-26 DIAGNOSIS — Z79899 Other long term (current) drug therapy: Secondary | ICD-10-CM

## 2016-06-26 DIAGNOSIS — Z794 Long term (current) use of insulin: Secondary | ICD-10-CM

## 2016-06-26 LAB — CBC WITH DIFFERENTIAL/PLATELET
BASOS ABS: 0 10*3/uL (ref 0.0–0.1)
BASOS PCT: 1 %
EOS ABS: 0 10*3/uL (ref 0.0–0.7)
Eosinophils Relative: 1 %
HCT: 32.2 % — ABNORMAL LOW (ref 39.0–52.0)
HEMOGLOBIN: 10.4 g/dL — AB (ref 13.0–17.0)
Lymphocytes Relative: 36 %
Lymphs Abs: 0.6 10*3/uL — ABNORMAL LOW (ref 0.7–4.0)
MCH: 27.9 pg (ref 26.0–34.0)
MCHC: 32.3 g/dL (ref 30.0–36.0)
MCV: 86.3 fL (ref 78.0–100.0)
Monocytes Absolute: 0.2 10*3/uL (ref 0.1–1.0)
Monocytes Relative: 15 %
NEUTROS ABS: 0.7 10*3/uL — AB (ref 1.7–7.7)
NEUTROS PCT: 47 %
Platelets: 108 10*3/uL — ABNORMAL LOW (ref 150–400)
RBC: 3.73 MIL/uL — AB (ref 4.22–5.81)
RDW: 13 % (ref 11.5–15.5)
WBC: 1.6 10*3/uL — AB (ref 4.0–10.5)

## 2016-06-26 LAB — COMPREHENSIVE METABOLIC PANEL
ALT: 42 U/L (ref 17–63)
AST: 54 U/L — AB (ref 15–41)
Albumin: 2.4 g/dL — ABNORMAL LOW (ref 3.5–5.0)
Alkaline Phosphatase: 70 U/L (ref 38–126)
Anion gap: 4 — ABNORMAL LOW (ref 5–15)
BUN: 38 mg/dL — AB (ref 6–20)
CHLORIDE: 105 mmol/L (ref 101–111)
CO2: 24 mmol/L (ref 22–32)
Calcium: 8.7 mg/dL — ABNORMAL LOW (ref 8.9–10.3)
Creatinine, Ser: 2.05 mg/dL — ABNORMAL HIGH (ref 0.61–1.24)
GFR calc non Af Amer: 33 mL/min — ABNORMAL LOW (ref >60)
GFR, EST AFRICAN AMERICAN: 38 mL/min — AB (ref >60)
Glucose, Bld: 126 mg/dL — ABNORMAL HIGH (ref 65–99)
POTASSIUM: 4.2 mmol/L (ref 3.5–5.1)
SODIUM: 133 mmol/L — AB (ref 135–145)
Total Bilirubin: 0.4 mg/dL (ref 0.3–1.2)
Total Protein: 7.1 g/dL (ref 6.5–8.1)

## 2016-06-26 LAB — DIGOXIN LEVEL: DIGOXIN LVL: 0.7 ng/mL — AB (ref 0.8–2.0)

## 2016-06-26 LAB — LACTIC ACID, PLASMA
LACTIC ACID, VENOUS: 1.1 mmol/L (ref 0.5–1.9)
Lactic Acid, Venous: 0.7 mmol/L (ref 0.5–1.9)

## 2016-06-26 LAB — TSH: TSH: 0.003 u[IU]/mL — ABNORMAL LOW (ref 0.350–4.500)

## 2016-06-26 LAB — POC OCCULT BLOOD, ED: Fecal Occult Bld: NEGATIVE

## 2016-06-26 LAB — TROPONIN I: TROPONIN I: 0.38 ng/mL — AB (ref <0.03)

## 2016-06-26 MED ORDER — ATORVASTATIN CALCIUM 40 MG PO TABS
80.0000 mg | ORAL_TABLET | Freq: Every day | ORAL | Status: DC
  Administered 2016-06-27 – 2016-06-30 (×4): 80 mg via ORAL
  Filled 2016-06-26 (×4): qty 2

## 2016-06-26 MED ORDER — INSULIN ASPART 100 UNIT/ML ~~LOC~~ SOLN: 0.0000 [IU] | Freq: Three times a day (TID) | SUBCUTANEOUS | Status: DC

## 2016-06-26 MED ORDER — STROKE: EARLY STAGES OF RECOVERY BOOK
Freq: Once | Status: AC
  Administered 2016-06-27: 15:00:00
  Filled 2016-06-26: qty 1

## 2016-06-26 MED ORDER — ENOXAPARIN SODIUM 40 MG/0.4ML ~~LOC~~ SOLN
40.0000 mg | SUBCUTANEOUS | Status: DC
  Administered 2016-06-27 – 2016-06-30 (×4): 40 mg via SUBCUTANEOUS
  Filled 2016-06-26 (×4): qty 0.4

## 2016-06-26 MED ORDER — ASPIRIN 325 MG PO TABS
325.0000 mg | ORAL_TABLET | Freq: Every day | ORAL | Status: DC
  Administered 2016-06-27 – 2016-06-30 (×4): 325 mg via ORAL
  Filled 2016-06-26 (×4): qty 1

## 2016-06-26 MED ORDER — SODIUM CHLORIDE 0.9 % IV SOLN
INTRAVENOUS | Status: DC
  Administered 2016-06-26: 500 mL via INTRAVENOUS
  Administered 2016-06-27 – 2016-06-30 (×7): via INTRAVENOUS

## 2016-06-26 MED ORDER — ACETAMINOPHEN 160 MG/5ML PO SOLN: 650.0000 mg | ORAL | Status: DC | PRN

## 2016-06-26 MED ORDER — MIRABEGRON ER 25 MG PO TB24
25.0000 mg | ORAL_TABLET | Freq: Every day | ORAL | Status: DC
  Administered 2016-06-27 – 2016-06-30 (×4): 25 mg via ORAL
  Filled 2016-06-26 (×4): qty 1

## 2016-06-26 MED ORDER — CARVEDILOL 12.5 MG PO TABS: 12.5000 mg | ORAL_TABLET | Freq: Two times a day (BID) | ORAL | Status: DC

## 2016-06-26 MED ORDER — SODIUM CHLORIDE 0.9 % IV BOLUS (SEPSIS)
500.0000 mL | Freq: Once | INTRAVENOUS | Status: AC
  Administered 2016-06-26: 500 mL via INTRAVENOUS

## 2016-06-26 MED ORDER — ASPIRIN 300 MG RE SUPP: 300.0000 mg | Freq: Every day | RECTAL | Status: DC

## 2016-06-26 MED ORDER — DIGOXIN 125 MCG PO TABS
62.5000 ug | ORAL_TABLET | Freq: Every day | ORAL | Status: DC
  Administered 2016-06-27 – 2016-06-30 (×4): 62.5 ug via ORAL
  Filled 2016-06-26 (×4): qty 1

## 2016-06-26 MED ORDER — ACETAMINOPHEN 325 MG PO TABS: 650.0000 mg | ORAL_TABLET | ORAL | Status: DC | PRN

## 2016-06-26 MED ORDER — ISOSORBIDE DINITRATE 20 MG PO TABS
40.0000 mg | ORAL_TABLET | Freq: Three times a day (TID) | ORAL | Status: DC
  Administered 2016-06-27 – 2016-06-30 (×9): 40 mg via ORAL
  Filled 2016-06-26 (×9): qty 2

## 2016-06-26 MED ORDER — ACETAMINOPHEN 650 MG RE SUPP: 650.0000 mg | RECTAL | Status: DC | PRN

## 2016-06-26 NOTE — ED Notes (Signed)
CRITICAL VALUE ALERT  Critical value received:  Troponin 0.38  Date of notification:  06/26/2016  Time of notification:  4580  Critical value read back:Yes.    Nurse who received alert:  Domenica Reamer   MD notified (1st page):  Dr Thurnell Garbe  Time of first page:  1730  MD notified (2nd page):  Time of second page:  Responding MD:  Dr Thurnell Garbe  Time MD responded:  [phone]

## 2016-06-26 NOTE — ED Provider Notes (Signed)
[facility] DEPT Provider Note   CSN: [id number] Arrival date & time: 06/26/16  1538     History   Chief Complaint Chief Complaint  Patient presents with  . Altered Mental Status    HPI KEYONDRE HEPBURN is a 66 y.o. male.  The history is provided by the patient, the EMS personnel and a caregiver. The history is limited by the condition of the patient (AMS per group home).  Altered Mental Status    Pt was seen at 1540. Per EMS and group home report: Group home sent pt to ED for "pt not acting like himself," "hypotension," and "dark stools."  Unknown LKW. Group home "noticed" symptoms today. Pt himself denies any complaints. Denies abd pain, no reported N/V/D, no CP/SOB.    Past Medical History:  Diagnosis Date  . AICD (automatic cardioverter/defibrillator) present   . Alcoholism in remission ([place])   . Anemia   . Aortic insufficiency   . Arthritis   . At moderate risk for fall   . Bell's palsy   . Cardiac defibrillator in situ   . Cardiomyopathy ([place])   . Chronic systolic heart failure (HCC)    NYHA Class III  . CKD (chronic kidney disease) stage 3, GFR 30-59 ml/min   . COPD (chronic obstructive pulmonary disease) ([place])   . CVA (cerebral infarction) 01/2013  . Essential hypertension, benign   . History of stroke   . Hyperlipidemia   . Noncompliance with medication regimen   . Nonischemic cardiomyopathy (HCC)    EF 15-20%  . NSVT (nonsustained ventricular tachycardia) ([place])   . Numbness of right jaw    Had a stoke in 01/2013. Numbness is occasional, especially when trying to chew.  . Productive cough 08/2013   With brown sputum   . SOB (shortness of breath) on exertion 08/2013  . Stroke [facility])    weakness of right side from CVA  . Type 2 diabetes mellitus ([place])   . Uses hearing aid 2014   recently received new hearing aids    Patient Active Problem List   Diagnosis Date Noted  . HCAP (healthcare-associated pneumonia) 06/04/2016  . Mucosal abnormality of stomach    . Gastritis and gastroduodenitis 09/30/2015  . Heme positive stool 09/30/2015  . Pancytopenia ([place]) 09/30/2015  . Chest pain 04/28/2015  . Elevated troponin 04/28/2015  . ICD (implantable cardioverter-defibrillator) in place 01/22/2015  . Anemia 09/30/2014  . Gastric polyp   . Reflux esophagitis   . Elevated LFTs 03/26/2014  . Anemia in chronic kidney disease 03/26/2014  . Chronic systolic CHF (congestive heart failure) ([place]) 01/21/2014  . Carotid bruit 12/18/2013  . Acute on chronic systolic heart failure ([place]) 10/24/2013  . CHF (congestive heart failure), NYHA class III ([place]) 10/23/2013  . Cough 10/23/2013  . CKD (chronic kidney disease) 10/23/2013  . Type II or unspecified type diabetes mellitus without mention of complication, not stated as uncontrolled 10/23/2013  . Essential hypertension, benign 10/23/2013  . Hyperkalemia 10/23/2013  . CAD (coronary artery disease) 10/23/2013  . History of stroke 10/23/2013  . Anemia of chronic kidney failure 10/23/2013  . Other and unspecified hyperlipidemia 10/23/2013    Past Surgical History:  Procedure Laterality Date  . BIOPSY N/A 04/10/2014   Procedure: GASTRIC BIOPSY;  Surgeon: Daneil Dolin, MD;  Location: [facility] ORS;  Service: Endoscopy;  Laterality: N/A;  . BIOPSY  10/12/2015   Procedure: BIOPSY;  Surgeon: Daneil Dolin, MD;  Location: [facility] ENDO SUITE;  Service: Endoscopy;;  stomach  bx's  . CARDIAC DEFIBRILLATOR PLACEMENT  11/12/12   [employer] Inogen MINI ICD implanted in [place] at [place] [facility] per Dr Nelly Laurence note  . COLONOSCOPY WITH PROPOFOL N/A 04/10/2014   RMR: Colonic Diverticulosis  . ESOPHAGOGASTRODUODENOSCOPY (EGD) WITH PROPOFOL N/A 04/10/2014   RMR: Mild erosive reflux esophagitis. Multiple antral polyps likely hyperplastic status post removal by hot snare cautery technique. Diffusely abnormal stomach status post gastric biopsy I suspect some of patients anemaia  may be due to intermittent oozing from the stomach. It would be difficult and a risky proposition to attempt complete removal of all of his gastric polyps.  . ESOPHAGOGASTRODUODENOSCOPY (EGD) WITH PROPOFOL N/A 10/12/2015   Procedure: ESOPHAGOGASTRODUODENOSCOPY (EGD) WITH PROPOFOL;  Surgeon: Daneil Dolin, MD;  Location: [facility] ENDO SUITE;  Service: Endoscopy;  Laterality: N/A;  9937 - moved to 9:15  . POLYPECTOMY N/A 04/10/2014   Procedure: GASTRIC POLYPECTOMY;  Surgeon: Daneil Dolin, MD;  Location: [facility] ORS;  Service: Endoscopy;  Laterality: N/A;  . RIGHT HEART CATHETERIZATION N/A 02/06/2014   Procedure: RIGHT HEART CATH;  Surgeon: Larey Dresser, MD;  Location: [facility] CATH LAB;  Service: Cardiovascular;  Laterality: N/A;       Home Medications    Prior to Admission medications   Medication Sig Start Date End Date Taking? Authorizing Provider  aspirin 81 MG EC tablet Take 1 tablet (81 mg total) by mouth daily. 12/17/13  Yes Larey Dresser, MD  atorvastatin (LIPITOR) 80 MG tablet Take 80 mg by mouth daily.    Yes Historical Provider, MD  carvedilol (COREG) 25 MG tablet Take 12.5 mg by mouth 2 (two) times daily with a meal.    Yes Historical Provider, MD  Cholecalciferol (VITAMIN D3) 2000 UNITS TABS Take 2,000 mg by mouth daily.   Yes Historical Provider, MD  digoxin (LANOXIN) 0.125 MG tablet TAKE 1/2 TABLET BY MOUTH DAILY 03/15/16  Yes Jolaine Artist, MD  ferrous sulfate 325 (65 FE) MG tablet Take 325 mg by mouth daily with breakfast.   Yes Historical Provider, MD  furosemide (LASIX) 40 MG tablet Take 40 mg by mouth daily.   Yes Historical Provider, MD  isosorbide dinitrate (ISORDIL) 20 MG tablet Take 40 mg by mouth 3 (three) times daily.  09/29/15  Yes Historical Provider, MD  losartan (COZAAR) 25 MG tablet Take 25 mg by mouth daily.   Yes Historical Provider, MD  mirabegron ER (MYRBETRIQ) 25 MG TB24 tablet Take 25 mg by mouth daily.   Yes Historical Provider, MD  mupirocin ointment (BACTROBAN)  2 % Place 1 application into the nose 2 (two) times daily.   Yes Historical Provider, MD  NOVOLOG FLEXPEN 100 UNIT/ML FlexPen Inject 1-28 Units into the skin daily. Up to 28 units daily  By sliding scale 09/02/15  Yes Historical Provider, MD  pantoprazole (PROTONIX) 40 MG tablet TAKE ONE TABLET   BY MOUTH   DAILY 05/25/16  Yes Mahala Menghini, PA-C  urea (CARMOL) 40 % CREA Apply 1 application topically 2 (two) times daily. 05/27/16  Yes Historical Provider, MD  levofloxacin (LEVAQUIN) 500 MG tablet Take 1 tablet (500 mg total) by mouth daily. Patient not taking: Reported on 06/26/2016 06/07/16   Sinda Du, MD  mirtazapine (REMERON) 7.5 MG tablet Take 1 tablet by mouth daily. 06/09/16   Historical Provider, MD    Family History Family History  Problem Relation Age of Onset  . Kidney disease Mother   .  Kidney disease Sister   . Colon cancer Neg Hx     Social History Social History  Substance Use Topics  . Smoking status: Former Smoker    Types: Cigarettes    Quit date: 11/05/1993  . Smokeless tobacco: Never Used  . Alcohol use Yes     Comment: former heavy ETOH, none recently     Allergies   Patient has no known allergies.   Review of Systems Review of Systems  Unable to perform ROS: Mental status change     Physical Exam Updated Vital Signs BP 101/58   Pulse 84   Temp 98 F (36.7 C)   Resp 18   Ht 5\' 8"  (1.727 m)   Wt 130 lb (59 kg)   SpO2 99%   BMI 19.77 kg/m   16:30:30 Orthostatic Vital Signs CB  Orthostatic Lying   BP- Lying: 123/72  Pulse- Lying: 81      Orthostatic Sitting  BP- Sitting: 111/72  Pulse- Sitting: 87      Orthostatic Standing at 0 minutes  BP- Standing at 0 minutes:  88/61  Pulse- Standing at 0 minutes: 90     Physical Exam 1545: Physical examination:  Nursing notes reviewed; Vital signs and O2 SAT reviewed;  Constitutional: Well developed, Well nourished, In no acute distress; Head:  Normocephalic, atraumatic; Eyes: EOMI, PERRL, No  scleral icterus; ENMT: Mouth and pharynx normal, Mucous membranes dry; Neck: Supple, Full range of motion, No lymphadenopathy; Cardiovascular: Regular rate and rhythm, No gallop; Respiratory: Breath sounds clear & equal bilaterally, No wheezes.  Speaking full sentences with ease, Normal respiratory effort/excursion; Chest: Nontender, Movement normal; Abdomen: Soft, Nontender, Nondistended, Normal bowel sounds; Genitourinary: No CVA tenderness; Extremities: Pulses normal, No tenderness, No edema, No calf edema or asymmetry.; Neuro: AA&Ox3, slow to answer questions. No facial droop. Major CN grossly intact.  Speech clear. Grips equal. Strength 5/5 equal bilat UE's and LE's. Pt moves all extremities spontaneously and to command without apparent gross focal motor deficits.; Skin: Color normal, Warm, Dry.   ED Treatments / Results  Labs (all labs ordered are listed, but only abnormal results are displayed)   EKG  EKG Interpretation  Date/Time:  Sunday June 26 2016 15:39:11 EST Ventricular Rate:  81 PR Interval:    QRS Duration: 120 QT Interval:  363 QTC Calculation: 422 R Axis:   -54 Text Interpretation:  Sinus rhythm Nonspecific IVCD with LAD LVH with secondary repolarization abnormality Anterior ST elevation, probably due to LVH Baseline wander When compared with ECG of 04/28/2015 No significant change was found Confirmed by [facility]  MD, Nunzio Cory [id number]) on 06/26/2016 3:47:02 PM       Radiology   Procedures Procedures (including critical care time)  Medications Ordered in ED Medications  0.9 %  sodium chloride infusion (500 mLs Intravenous New Bag/Given 06/26/16 1647)  sodium chloride 0.9 % bolus 500 mL (500 mLs Intravenous New Bag/Given 06/26/16 1646)     Initial Impression / Assessment and Plan / ED Course  I have reviewed the triage vital signs and the nursing notes.  Pertinent labs & imaging results that were available during my care of the patient were reviewed by me and  considered in my medical decision making (see chart for details).  MDM Reviewed: previous chart, nursing note and vitals Reviewed previous: labs and ECG Interpretation: labs, ECG, x-ray and CT scan   Results for orders placed or performed during the hospital encounter of 06/26/16  Comprehensive metabolic panel  Result Value Ref  Range   Sodium 133 (L) 135 - 145 mmol/L   Potassium 4.2 3.5 - 5.1 mmol/L   Chloride 105 101 - 111 mmol/L   CO2 24 22 - 32 mmol/L   Glucose, Bld 126 (H) 65 - 99 mg/dL   BUN 38 (H) 6 - 20 mg/dL   Creatinine, Ser 2.05 (H) 0.61 - 1.24 mg/dL   Calcium 8.7 (L) 8.9 - 10.3 mg/dL   Total Protein 7.1 6.5 - 8.1 g/dL   Albumin 2.4 (L) 3.5 - 5.0 g/dL   AST 54 (H) 15 - 41 U/L   ALT 42 17 - 63 U/L   Alkaline Phosphatase 70 38 - 126 U/L   Total Bilirubin 0.4 0.3 - 1.2 mg/dL   GFR calc non Af Amer 33 (L) >60 mL/min   GFR calc Af Amer 38 (L) >60 mL/min   Anion gap 4 (L) 5 - 15  Troponin I  Result Value Ref Range   Troponin I 0.38 (HH) <0.03 ng/mL  Lactic acid, plasma  Result Value Ref Range   Lactic Acid, Venous 1.1 0.5 - 1.9 mmol/L  CBC with Differential  Result Value Ref Range   WBC 1.6 (L) 4.0 - 10.5 K/uL   RBC 3.73 (L) 4.22 - 5.81 MIL/uL   Hemoglobin 10.4 (L) 13.0 - 17.0 g/dL   HCT 32.2 (L) 39.0 - 52.0 %   MCV 86.3 78.0 - 100.0 fL   MCH 27.9 26.0 - 34.0 pg   MCHC 32.3 30.0 - 36.0 g/dL   RDW 13.0 11.5 - 15.5 %   Platelets 108 (L) 150 - 400 K/uL   Neutrophils Relative % 47 %   Neutro Abs 0.7 (L) 1.7 - 7.7 K/uL   Lymphocytes Relative 36 %   Lymphs Abs 0.6 (L) 0.7 - 4.0 K/uL   Monocytes Relative 15 %   Monocytes Absolute 0.2 0.1 - 1.0 K/uL   Eosinophils Relative 1 %   Eosinophils Absolute 0.0 0.0 - 0.7 K/uL   Basophils Relative 1 %   Basophils Absolute 0.0 0.0 - 0.1 K/uL  Digoxin level  Result Value Ref Range   Digoxin Level 0.7 (L) 0.8 - 2.0 ng/mL  POC occult blood, ED  Result Value Ref Range   Fecal Occult Bld NEGATIVE NEGATIVE   Dg Chest 2 View    Result Date: 06/26/2016 CLINICAL DATA:  Patient with altered mental status. EXAM: CHEST  2 VIEW COMPARISON:  A chest radiograph 06/03/2016. FINDINGS: Single lead AICD device overlies the left hemithorax. Stable cardiomegaly status post median sternotomy. No consolidative pulmonary opacities. No pleural effusion or pneumothorax. Regional skeleton is unremarkable. IMPRESSION: No active cardiopulmonary disease. Electronically Signed   By: Lovey Newcomer M.D.   On: 06/26/2016 16:18   Ct Head Wo Contrast Result Date: 06/26/2016 CLINICAL DATA:  66 year old male with altered mental status. EXAM: CT HEAD WITHOUT CONTRAST TECHNIQUE: Contiguous axial images were obtained from the base of the skull through the vertex without intravenous contrast. COMPARISON:  09/22/2015 and 04/28/2015 head CTs FINDINGS: Brain: A left cerebellar infarct is noted, age indeterminate but new since 09/22/2015. There is no evidence of hemorrhage. Atrophy, severe chronic small-vessel white matter ischemic changes and remote infarcts within the right cerebellum, pons, basal ganglia, thalami and right parietal regions again noted. Vascular: Intracranial atherosclerotic calcifications noted. Skull: Normal. Negative for fracture or focal lesion. Sinuses/Orbits: No acute finding. Other: None. IMPRESSION: Age indeterminate left cerebellar infarct without hemorrhage, new since 09/22/2015. Atrophy, chronic small-vessel white matter ischemic changes and remote infarcts  as described. Electronically Signed   By: Margarette Canada M.D.   On: 06/26/2016 16:26   Results for KELDAN, EPLIN (MRN [id number]) as of 06/26/2016 18:08  Ref. Range 11/28/2015 13:04 06/03/2016 23:30 06/05/2016 06:02 06/08/2016 05:38 06/26/2016 16:28  BUN Latest Ref Range: 6 - 20 mg/dL 25 (H) 32 (H) 27 (H) 25 (H) 38 (H)  Creatinine Latest Ref Range: 0.61 - 1.24 mg/dL 1.30 (H) 1.75 (H) 1.50 (H) 1.62 (H) 2.05 (H)   Results for DOVER, HEAD (MRN [id number]) as of 06/26/2016 18:08  Ref. Range  06/03/2016 23:30 06/04/2016 03:43 06/04/2016 06:33 06/04/2016 14:35 06/26/2016 16:28  Troponin I Latest Ref Range: <0.03 ng/mL 0.27 (HH) 0.26 (HH) 0.29 (HH) 0.25 (HH) 0.38 (HH)     1830:   New age-indeterminate infarct on CT scan today. Troponin elevated from usual baseline; pt denies CP. BUN/Cr elevated from baseline and pt is orthostatic; judicious IVF bolus and gtt started. T/C to [facility] Dr. Lorin Mercy, case discussed, including:  HPI, pertinent PM/SHx, VS/PE, dx testing, ED course and treatment:  Agreeable to admit, requests to write temporary orders, obtain tele bed to Dr. Luan Pulling' service.   Final Clinical Impressions(s) / ED Diagnoses   Final diagnoses:  None    New Prescriptions New Prescriptions   No medications on file     Francine Graven, DO 06/29/16 1331

## 2016-06-26 NOTE — ED Triage Notes (Signed)
Pt sent from group home for altered mental status and hypotension. Pt alert and oriented x 4, slow to respond but answers appropriately, speech clear. Pt moves all extremities equally x 4. Nad noted.

## 2016-06-26 NOTE — H&P (Addendum)
History and Physical    Keith Young ALP:[id number] DOB: 30-Oct-1950 DOA: 06/26/2016  PCP: [name], MD Consultants:  [place]anda Foster - nephrology; foot doctor; [place]; [place] - cardiology; [place] - oncology Patient coming from: group home ([facility]); present today with brother-in-law; NOK: sister, [phone]  Chief Complaint: AMS  HPI: Keith Young is a 66 y.o. 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  He is unable to provide any history; history is provided by his brother-in-law.   Brother-in-law reports that he is not responding well.  Does not always eat well.  Diabetic, sugars up and down. The family is concerned about aspiration with drinking.  Disoriented, delusional.  Hospitalized for PNA 1-2 weeks ago, and symptoms have been ongoing since.  Still not really back to baseline.  Possibly a little rattle in his chest today, but cough seems to be related to possible aspiration.  Has urinary/fecal incontinence with coughing.  Gets so confused he takes his clothes off without realizing it.  Difficulty sitting up in bed.  Unable to dress himself.  Having home PT, home health, ST - they are also concerned.   ED Course: Per Dr. Thurnell Garbe: 1830: New age-indeterminate infarct on CT scan today. Troponin elevated from usual baseline; pt denies CP. BUN/Cr elevated from baseline and pt is orthostatic; judicious IVF bolus and gtt started. T/C to [facility] Dr. Lorin Mercy, case discussed, including: HPI, pertinent PM/SHx, VS/PE, dx testing, ED course and treatment: Agreeable to admit, requests to write temporary orders, obtain tele bed to Dr. Luan Pulling' service.   Review of Systems: Unable to obtain due to AMS  Ambulatory Status:  Ambulates with a walker, today couldn't even stand  Past Medical History:  Diagnosis Date  . AICD (automatic  cardioverter/defibrillator) present   . Alcoholism in remission ([place])   . Anemia   . Aortic insufficiency   . Arthritis   . At moderate risk for fall   . Bell's palsy   . Cardiomyopathy ([place])   . Chronic systolic heart failure (HCC)    NYHA Class III  . CKD (chronic kidney disease) stage 3, GFR 30-59 ml/min   . COPD (chronic obstructive pulmonary disease) ([place])   . Essential hypertension, benign   . Hyperlipidemia   . Noncompliance with medication regimen   . Nonischemic cardiomyopathy (HCC)    EF 15-20%  . NSVT (nonsustained ventricular tachycardia) ([place])   . Numbness of right jaw    Had a stoke in 01/2013. Numbness is occasional, especially when trying to chew.  . Productive cough 08/2013   With brown sputum   . SOB (shortness of breath) on exertion 08/2013  . Stroke ([place]) 01/2013   weakness of right side from CVA  . Type 2 diabetes mellitus ([place])   . Uses hearing aid 2014   recently received new hearing aids    Past Surgical History:  Procedure Laterality Date  . BIOPSY N/A 04/10/2014   Procedure: GASTRIC BIOPSY;  Surgeon: Daneil Dolin, MD;  Location: [facility];  Service: Endoscopy;  Laterality: N/A;  . BIOPSY  10/12/2015   Procedure: BIOPSY;  Surgeon: Daneil Dolin, MD;  Location: [facility];  Service: Endoscopy;;  stomach bx's  . CARDIAC DEFIBRILLATOR PLACEMENT  11/12/12   [employer] Inogen MINI ICD implanted in [place] at [facility]  bioprostetic AVR per Dr Nelly Laurence note  . COLONOSCOPY WITH PROPOFOL N/A 04/10/2014   RMR: Colonic Diverticulosis  . ESOPHAGOGASTRODUODENOSCOPY (EGD) WITH PROPOFOL N/A 04/10/2014   RMR: Mild erosive reflux esophagitis. Multiple antral polyps likely hyperplastic status post removal by hot snare cautery technique. Diffusely abnormal stomach status post gastric biopsy I suspect some of patients anemaia may be due to intermittent oozing from the stomach. It would be difficult and a risky proposition to  attempt complete removal of all of his gastric polyps.  . ESOPHAGOGASTRODUODENOSCOPY (EGD) WITH PROPOFOL N/A 10/12/2015   Procedure: ESOPHAGOGASTRODUODENOSCOPY (EGD) WITH PROPOFOL;  Surgeon: Daneil Dolin, MD;  Location[facility];  Service: Endoscopy;  Laterality: N/A;  7322 - moved to 9:15  . POLYPECTOMY N/A 04/10/2014   Procedure: GASTRIC POLYPECTOMY;  Surgeon: Daneil Dolin, MD;  Location: [facility];  Service: Endoscopy;  Laterality: N/A;  . RIGHT HEART CATHETERIZATION N/A 02/06/2014   Procedure: RIGHT HEART CATH;  Surgeon: Larey Dresser, MD;  Location: [facility] CATH LAB;  Service: Cardiovascular;  Laterality: N/A;    Social History   Social History  . Marital status: Single    Spouse name: N/A  . Number of children: N/A  . Years of education: N/A   Occupational History  . disabled    Social History Main Topics  . Smoking status: Former Smoker    Types: Cigarettes    Quit date: 11/05/1993  . Smokeless tobacco: Never Used  . Alcohol use Yes     Comment: former heavy ETOH, none recently  . Drug use: No     Comment: prior history of cocaine use, smoking   . Sexual activity: Yes    Birth control/ protection: None   Other Topics Concern  . Not on file   Social History Narrative   Recently moved from [place] to be with family in [place] (2015).  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  Has h/o polysubstance abuse.  Baseline - able to ambulate, mild cognitive delay, able to toilet and shower himself, occasional use of walker/cane.                No Known Allergies  Family History  Problem Relation Age of Onset  . Kidney disease Mother   . Kidney disease Sister   . Colon cancer Neg Hx     Prior to Admission medications   Medication Sig Start Date End Date Taking? Authorizing Provider  aspirin 81 MG EC tablet Take 1 tablet (81 mg total) by mouth daily. 12/17/13  Yes Larey Dresser, MD  atorvastatin (LIPITOR) 80 MG tablet Take 80 mg by mouth daily.     Yes Historical Provider, MD  carvedilol (COREG) 25 MG tablet Take 12.5 mg by mouth 2 (two) times daily with a meal.    Yes Historical Provider, MD  Cholecalciferol (VITAMIN D3) 2000 UNITS TABS Take 2,000 mg by mouth daily.   Yes Historical Provider, MD  digoxin (LANOXIN) 0.125 MG tablet TAKE 1/2 TABLET BY MOUTH DAILY 03/15/16  Yes Jolaine Artist, MD  ferrous sulfate 325 (65 FE) MG tablet Take 325 mg by mouth daily with breakfast.   Yes Historical Provider, MD  furosemide (LASIX) 40 MG tablet Take 40 mg by mouth daily.   Yes Historical Provider, MD  isosorbide dinitrate (ISORDIL) 20 MG tablet Take 40 mg by mouth 3 (three) times daily.  09/29/15  Yes Historical Provider, MD  losartan (COZAAR) 25 MG tablet Take 25 mg  by mouth daily.   Yes Historical Provider, MD  mirabegron ER (MYRBETRIQ) 25 MG TB24 tablet Take 25 mg by mouth daily.   Yes Historical Provider, MD  mupirocin ointment (BACTROBAN) 2 % Place 1 application into the nose 2 (two) times daily.   Yes Historical Provider, MD  NOVOLOG FLEXPEN 100 UNIT/ML FlexPen Inject 1-28 Units into the skin daily. Up to 28 units daily  By sliding scale 09/02/15  Yes Historical Provider, MD  pantoprazole (PROTONIX) 40 MG tablet TAKE ONE TABLET   BY MOUTH   DAILY 05/25/16  Yes Mahala Menghini, PA-C  urea (CARMOL) 40 % CREA Apply 1 application topically 2 (two) times daily. 05/27/16  Yes Historical Provider, MD  levofloxacin (LEVAQUIN) 500 MG tablet Take 1 tablet (500 mg total) by mouth daily. Patient not taking: Reported on 06/26/2016 06/07/16   Sinda Du, MD  mirtazapine (REMERON) 7.5 MG tablet Take 1 tablet by mouth daily. 06/09/16   Historical Provider, MD    Physical Exam: Vitals:   06/26/16 1541 06/26/16 1750 06/26/16 2002 06/26/16 2020  BP: 101/58 147/75 151/80 (!) 159/80  Pulse: 84 90 94 92  Resp: 18 20 20 20   Temp: 98 F (36.7 C)  98.6 F (37 C) 98.5 F (36.9 C)  TempSrc:   Oral Oral  SpO2: 99% 97% 100% 100%  Weight:    53 kg (116 lb 12.8  oz)  Height:    5\' 3"  (1.6 m)     General: Appears calm and comfortable and is NAD; very somnolent, opens eyes and answers a few questions but difficult for him to stay awake to participate Eyes:  PERRL, EOMI, normal lids, iris ENT:  Hard of hearing,normal  lips & tongue, mmm Neck:  no LAD, masses or thyromegaly Cardiovascular:  RRR, 8-9/2 systolic murmur, no r/g. No LE edema.  Respiratory:  CTA bilaterally, no w/r/r. Normal respiratory effort. Abdomen:  soft, ntnd, NABS Skin:  no rash or induration seen on limited exam Musculoskeletal:  grossly normal tone BUE/BLE, good ROM, no bony abnormality Psychiatric: extremely somnolent, opens eyes and answers a few questions or follows a few commands and then lapses back to sleep Neurologic: appears grossly at apparent baseline other than prominent left facial droop, but this is very limited by patient's ability to cooperate  Labs on Admission: I have personally reviewed following labs and imaging studies  CBC:  Recent Labs Lab 06/20/16 1400 06/21/16 1020 06/26/16 1628  WBC 2.6* 2.6* 1.6*  NEUTROABS  --   --  0.7*  HGB 10.7* 10.8* 10.4*  HCT 33.6* 34.0* 32.2*  MCV 87.0 87.4 86.3  PLT 173 142* 119*   Basic Metabolic Panel:  Recent Labs Lab 06/26/16 1628  NA 133*  K 4.2  CL 105  CO2 24  GLUCOSE 126*  BUN 38*  CREATININE 2.05*  CALCIUM 8.7*   GFR: Estimated Creatinine Clearance: 27.3 mL/min (by C-G formula based on SCr of 2.05 mg/dL (H)). Liver Function Tests:  Recent Labs Lab 06/26/16 1628  AST 54*  ALT 42  ALKPHOS 70  BILITOT 0.4  PROT 7.1  ALBUMIN 2.4*   No results for input(s): LIPASE, AMYLASE in the last 168 hours. No results for input(s): AMMONIA in the last 168 hours. Coagulation Profile: No results for input(s): INR, PROTIME in the last 168 hours. Cardiac Enzymes:  Recent Labs Lab 06/26/16 1628  TROPONINI 0.38*   BNP (last 3 results) No results for input(s): PROBNP in the last 8760  hours. HbA1C: No results  for input(s): HGBA1C in the last 72 hours. CBG: No results for input(s): GLUCAP in the last 168 hours. Lipid Profile: No results for input(s): CHOL, HDL, LDLCALC, TRIG, CHOLHDL, LDLDIRECT in the last 72 hours. Thyroid Function Tests: No results for input(s): TSH, T4TOTAL, FREET4, T3FREE, THYROIDAB in the last 72 hours. Anemia Panel: No results for input(s): VITAMINB12, FOLATE, FERRITIN, TIBC, IRON, RETICCTPCT in the last 72 hours. Urine analysis:    Component Value Date/Time   COLORURINE YELLOW 06/24/2016 0900   APPEARANCEUR CLEAR 06/24/2016 0900   LABSPEC 1.010 06/24/2016 0900   PHURINE 5.0 06/24/2016 0900   GLUCOSEU NEGATIVE 06/24/2016 0900   HGBUR SMALL (A) 06/24/2016 0900   BILIRUBINUR NEGATIVE 06/24/2016 0900   KETONESUR NEGATIVE 06/24/2016 0900   PROTEINUR 100 (A) 06/24/2016 0900   UROBILINOGEN 0.2 10/23/2013 1545   NITRITE NEGATIVE 06/24/2016 0900   LEUKOCYTESUR NEGATIVE 06/24/2016 0900    Creatinine Clearance: Estimated Creatinine Clearance: 27.3 mL/min (by C-G formula based on SCr of 2.05 mg/dL (H)).  Sepsis Labs: @LABRCNTIP (procalcitonin:4,lacticidven:4) ) Recent Results (from the past 240 hour(s))  Culture, Urine     Status: Abnormal (Preliminary result)   Collection Time: 06/24/16  9:00 AM  Result Value Ref Range Status   Specimen Description URINE, RANDOM  Final   Special Requests NONE  Final   Culture (A)  Final    60,000 COLONIES/mL ESCHERICHIA COLI SUSCEPTIBILITIES TO FOLLOW Performed at [facility], [address] [place], [place] [address]    Report Status PENDING  Incomplete     Radiological Exams on Admission: Dg Chest 2 View  Result Date: 06/26/2016 CLINICAL DATA:  Patient with altered mental status. EXAM: CHEST  2 VIEW COMPARISON:  A chest radiograph 06/03/2016. FINDINGS: Single lead AICD device overlies the left hemithorax. Stable cardiomegaly status post median sternotomy. No consolidative pulmonary opacities.  No pleural effusion or pneumothorax. Regional skeleton is unremarkable. IMPRESSION: No active cardiopulmonary disease. Electronically Signed   By: Lovey Newcomer M.D.   On: 06/26/2016 16:18   Ct Head Wo Contrast  Result Date: 06/26/2016 CLINICAL DATA:  66 year old male with altered mental status. EXAM: CT HEAD WITHOUT CONTRAST TECHNIQUE: Contiguous axial images were obtained from the base of the skull through the vertex without intravenous contrast. COMPARISON:  09/22/2015 and 04/28/2015 head CTs FINDINGS: Brain: A left cerebellar infarct is noted, age indeterminate but new since 09/22/2015. There is no evidence of hemorrhage. Atrophy, severe chronic small-vessel white matter ischemic changes and remote infarcts within the right cerebellum, pons, basal ganglia, thalami and right parietal regions again noted. Vascular: Intracranial atherosclerotic calcifications noted. Skull: Normal. Negative for fracture or focal lesion. Sinuses/Orbits: No acute finding. Other: None. IMPRESSION: Age indeterminate left cerebellar infarct without hemorrhage, new since 09/22/2015. Atrophy, chronic small-vessel white matter ischemic changes and remote infarcts as described. Electronically Signed   By: Margarette Canada M.D.   On: 06/26/2016 16:26    EKG: Independently reviewed.  NSR with rate 81; LVH with secondary repolarization abnormality, not significantly different from prior  Assessment/Plan Principal Problem:   Altered mental status Active Problems:   CKD (chronic kidney disease)   Diabetes mellitus type 2 in nonobese (HCC)   Anemia in chronic kidney disease   ICD (implantable cardioverter-defibrillator) in place   Elevated troponin   Pancytopenia (HCC)   CVA (cerebral vascular accident) [facility])   [place] -Patient with apparent CVA new since 4/17 on CT -Now with left-sided facial droop -Persistent AMS since last hospitalization -Uncertain etiology, but concern is that he developed  a new stroke between last admission and  now -Will request MRI/A - but patient does have AICD in place -Will request Echo -Carotid US -Risk stratification with FLP, A1c; will also check TSH and UDS -ASA daily -PT/OT/ST/Nutrition Consults -SW consult for placement  HTN -Allow permissive HTN -Treat BP only if >220/120, and then with goal of 15% reduction -Hold Coreg, Cozaar and plan to restart in 48-72 hours  HLD -Check FLP -Continue Lipitor 80mg   DM -Glucose 126 -Cover with SSI  CKD -Creatinine 2.05 -May have mild AKI, as baseline appears to be 1.5-1.6 -He was orthostatic in ER and so was given IVF  Malnutrition -Albumin 2.4 -Nutrition consult ordered  Elevated troponin -Troponin 0.38 -Likely combination of chronic troponin leak and CKD -Will trend -No apparent chest pain, patient denies but unsure how accurate this history is  Pancytopenia -WBC 1.6 -Hgb 10.4 -Plt 108 -All somewhat stable, will trend   DVT prophylaxis: Lovenox - if platelets are >100k  Code Status:  Full - confirmed with family; encouraged this to be carefully reconsidered Family Communication: Brother-in-law present throughout evaluation Disposition Plan: To be determined Consults called: None, but consider neurology; Dr. Luan Pulling to assume care in the AM Admission status:  Admit - It is my clinical opinion that admission to INPATIENT is reasonable and necessary because this patient will require at least 2 midnights in the hospital to treat this condition based on the medical complexity of the problems presented.  Given the aforementioned information, the predictability of an adverse outcome is felt to be significant.     Karmen Bongo MD [facility]  If 7PM-7AM, please contact night-coverage [email or web address] Password [email or web address]  06/26/2016, 11:10 PM

## 2016-06-27 ENCOUNTER — Inpatient Hospital Stay (HOSPITAL_COMMUNITY): Payer: Medicare HMO

## 2016-06-27 DIAGNOSIS — I6350 Cerebral infarction due to unspecified occlusion or stenosis of unspecified cerebral artery: Secondary | ICD-10-CM

## 2016-06-27 LAB — ECHOCARDIOGRAM COMPLETE
AV Area VTI index: 0.62 cm2/m2
AV Area VTI: 0.97 cm2
AV Area mean vel: 0.91 cm2
AV Mean grad: 26 mmHg
AV area mean vel ind: 0.59 cm2/m2
AV vel: 0.95
AVA: 0.95 cm2
AVCELMEANRAT: 0.32
AVLVOTPG: 5 mmHg
AVPG: 46 mmHg
AVPKVEL: 339 cm/s
Ao pk vel: 0.34 m/s
CHL CUP AV PEAK INDEX: 0.63
CHL CUP AV VALUE AREA INDEX: 0.62
CHL CUP RV SYS PRESS: 39 mmHg
DOP CAL AO MEAN VELOCITY: 235 cm/s
EWDT: 204 ms
FS: 13 % — AB (ref 28–44)
Height: 63 in
IVS/LV PW RATIO, ED: 1.08
LA ID, A-P, ES: 47 mm
LA diam index: 3.06 cm/m2
LA vol A4C: 71.9 ml
LA vol index: 51.4 mL/m2
LAVOL: 78.8 mL
LDCA: 2.84 cm2
LEFT ATRIUM END SYS DIAM: 47 mm
LV PW d: 14.3 mm — AB (ref 0.6–1.1)
LV SIMPSON'S DISK: 39
LV dias vol index: 130 mL/m2
LV sys vol index: 80 mL/m2
LV sys vol: 122 mL — AB (ref 21–61)
LVDIAVOL: 199 mL — AB (ref 62–150)
LVOT SV: 64 mL
LVOT VTI: 22.4 cm
LVOT peak VTI: 0.34 cm
LVOT peak vel: 116 cm/s
LVOTD: 19 mm
MV Dec: 204
MV pk E vel: 57.2 m/s
MVPKAVEL: 114 m/s
Reg peak vel: 302 cm/s
Stroke v: 77 ml
TAPSE: 18 mm
TR max vel: 302 cm/s
VTI: 66.8 cm
Weight: 1868.8 oz

## 2016-06-27 LAB — LIPID PANEL
CHOL/HDL RATIO: 6 ratio
Cholesterol: 78 mg/dL (ref 0–200)
HDL: 13 mg/dL — ABNORMAL LOW (ref >40)
LDL Cholesterol: 43 mg/dL (ref 0–99)
Triglycerides: 110 mg/dL (ref <150)
VLDL: 22 mg/dL (ref 0–40)

## 2016-06-27 LAB — TROPONIN I
Troponin I: 0.36 ng/mL (ref <0.03)
Troponin I: 0.31 ng/mL (ref <0.03)
Troponin I: 0.36 ng/mL (ref <0.03)

## 2016-06-27 LAB — GLUCOSE, CAPILLARY
GLUCOSE-CAPILLARY: 106 mg/dL — AB (ref 65–99)
GLUCOSE-CAPILLARY: 84 mg/dL (ref 65–99)
Glucose-Capillary: 56 mg/dL — ABNORMAL LOW (ref 65–99)
Glucose-Capillary: 61 mg/dL — ABNORMAL LOW (ref 65–99)
Glucose-Capillary: 109 mg/dL — ABNORMAL HIGH (ref 65–99)
Glucose-Capillary: 93 mg/dL (ref 65–99)

## 2016-06-27 LAB — URINE CULTURE

## 2016-06-27 LAB — VITAMIN B12: Vitamin B-12: 421 pg/mL (ref 180–914)

## 2016-06-27 MED ORDER — DEXTROSE 50 % IV SOLN
INTRAVENOUS | Status: AC
  Administered 2016-06-27: 08:00:00
  Filled 2016-06-27: qty 50

## 2016-06-27 MED ORDER — DEXTROSE 50 % IV SOLN
25.0000 mL | Freq: Once | INTRAVENOUS | Status: AC
  Administered 2016-06-27: 25 mL via INTRAVENOUS

## 2016-06-27 MED ORDER — ADULT MULTIVITAMIN W/MINERALS CH
1.0000 | ORAL_TABLET | Freq: Every day | ORAL | Status: DC
  Administered 2016-06-27 – 2016-06-30 (×4): 1 via ORAL
  Filled 2016-06-27 (×4): qty 1

## 2016-06-27 MED ORDER — DEXTROSE 50 % IV SOLN: 25.0000 mL | Freq: Once | INTRAVENOUS | Status: AC

## 2016-06-27 MED ORDER — ENSURE ENLIVE PO LIQD
237.0000 mL | Freq: Two times a day (BID) | ORAL | Status: DC
  Administered 2016-06-27 – 2016-06-30 (×6): 237 mL via ORAL

## 2016-06-27 NOTE — Telephone Encounter (Signed)
Reminder in epic °

## 2016-06-27 NOTE — Evaluation (Signed)
Clinical/Bedside Swallow Evaluation Patient Details  Name: Keith Young MRN: [id number] Date of Birth: 07/15/1950  Today's Date: 06/27/2016 Time: SLP Start Time (ACUTE ONLY): 1315 SLP Stop Time (ACUTE ONLY): 1350 SLP Time Calculation (min) (ACUTE ONLY): 35 min  Past Medical History:  Past Medical History:  Diagnosis Date  . AICD (automatic cardioverter/defibrillator) present   . Alcoholism in remission ([place])   . Anemia   . Aortic insufficiency   . Arthritis   . At moderate risk for fall   . Bell's palsy   . Cardiomyopathy ([place])   . Chronic systolic heart failure (HCC)    NYHA Class III  . CKD (chronic kidney disease) stage 3, GFR 30-59 ml/min   . COPD (chronic obstructive pulmonary disease) ([place])   . Essential hypertension, benign   . Hyperlipidemia   . Noncompliance with medication regimen   . Nonischemic cardiomyopathy (HCC)    EF 15-20%  . NSVT (nonsustained ventricular tachycardia) ([place])   . Numbness of right jaw    Had a stoke in 01/2013. Numbness is occasional, especially when trying to chew.  . Productive cough 08/2013   With brown sputum   . SOB (shortness of breath) on exertion 08/2013  . Stroke ([place]) 01/2013   weakness of right side from CVA  . Type 2 diabetes mellitus ([place])   . Uses hearing aid 2014   recently received new hearing aids   Past Surgical History:  Past Surgical History:  Procedure Laterality Date  . BIOPSY N/A 04/10/2014   Procedure: GASTRIC BIOPSY;  Surgeon: Daneil Dolin, MD;  Location[facility];  Service: Endoscopy;  Laterality: N/A;  . BIOPSY  10/12/2015   Procedure: BIOPSY;  Surgeon: Daneil Dolin, MD;  [facility];  Service: Endoscopy;;  stomach bx's  . CARDIAC DEFIBRILLATOR PLACEMENT  11/12/12   [employer] Inogen MINI ICD implanted in [place] at [place] AVR per Dr Nelly Laurence note  . COLONOSCOPY WITH PROPOFOL N/A 04/10/2014   RMR: Colonic Diverticulosis  .  ESOPHAGOGASTRODUODENOSCOPY (EGD) WITH PROPOFOL N/A 04/10/2014   RMR: Mild erosive reflux esophagitis. Multiple antral polyps likely hyperplastic status post removal by hot snare cautery technique. Diffusely abnormal stomach status post gastric biopsy I suspect some of patients anemaia may be due to intermittent oozing from the stomach. It would be difficult and a risky proposition to attempt complete removal of all of his gastric polyps.  . ESOPHAGOGASTRODUODENOSCOPY (EGD) WITH PROPOFOL N/A 10/12/2015   Procedure: ESOPHAGOGASTRODUODENOSCOPY (EGD) WITH PROPOFOL;  Surgeon: Daneil Dolin, MD;  [facility];  Service: Endoscopy;  Laterality: N/A;  3007 - moved to 9:15  . POLYPECTOMY N/A 04/10/2014   Procedure: GASTRIC POLYPECTOMY;  Surgeon: Daneil Dolin, MD;  Location: [facility];  Service: Endoscopy;  Laterality: N/A;  . RIGHT HEART CATHETERIZATION N/A 02/06/2014   Procedure: RIGHT HEART CATH;  Surgeon: Larey Dresser, MD;  Location: [facility] CATH LAB;  Service: Cardiovascular;  Laterality: N/A;   HPI: 66 y.o. male with hx of systolic CHF EF 62% s/p ICD, CKD3, HLD, NSVT, prior CVA, DM2, HTN, brought to the ER from a group home as he has been intermittently more confused these past few days.  He also complained of sputum productive coughs, but no chest pain, fever, or chills.  Evaluation in the ER showed CXR with PNA.  Head CT shows a stroke that is new since April of last year. He can not do MRI  because he has a pacer defibrillator. BSE ordered d/t failing swallow screen in ED.      Assessment / Plan / Recommendation Clinical Impression   Pt seen for BSE, sitting upright in bed. He was confused at times throughout visit. Noted black residue and white buildup on tongue - nsg aware. Extensive oral care provided, prior to po trials.   Noted weak labial seal, noted atypical facial symmetry, strength, and ROM on left side.  Noted weak lingual musculature and weak cough. He consumed ice chips with decreased  bolus awareness, requiring cues to manipulate and swallow ice chips, no s/sx of aspiration noted. He consumed water with spoon and cup presentations with reduced labial seal, timely swallow; however, noted coughing post swallow with straw sips of thin-suspecting decreased swallowing coordiantion. He consumed puree solids with no difficulty, he consumed graham cracker with puree with minimal residue remaining in oral cavity requiring verbal cues from SLP to clear eventually requiring liquid wash d/t weak lingual musculature-no s/sx of aspiration noted. He consumed graham cracker - prolonged mastication noted, eventually holding in oral cavity and requiring cues from SLP to adequately masticate. Pt presents with oropharyngeal dysphagia and is at moderate risk of aspiration. Recommended dysphagia 2 diet, thin liquids (no straws), upright HOB, small sips and bites, full supervision and assist with meals, crush meds in puree, oral care before and after meals-educated patient and nursing about recommendations. Plan to follow up to ensure diet tolerance.          Aspiration Risk  Moderate aspiration risk    Diet Recommendation Dysphagia 2 (Fine chop);Thin liquid   Liquid Administration via: Spoon;Cup;No straw Medication Administration: Crushed with puree Supervision: Full supervision/cueing for compensatory strategies Compensations: Slow rate;Small sips/bites;Follow solids with liquid;Other (Comment) (oral care after meals ) Postural Changes: Seated upright at 90 degrees;Remain upright for at least 30 minutes after po intake    Other  Recommendations Oral Care Recommendations: Oral care before and after PO;Oral care QID   Follow up Recommendations Skilled Nursing facility      Frequency and Duration min 2x/week  1 week       Prognosis Prognosis for Safe Diet Advancement: Good Barriers to Reach Goals: Cognitive deficits      Swallow Study   General Date of Onset: 06/26/16 Type of Study:  Bedside Swallow Evaluation Diet Prior to this Study: Regular;Thin liquids Temperature Spikes Noted: No Respiratory Status: Room air Behavior/Cognition: Confused;Distractible;Requires cueing Oral Cavity Assessment: Other (comment) (black and white residue ) Oral Care Completed by SLP: Yes Oral Cavity - Dentition: Dentures, not available Vision: Functional for self-feeding Self-Feeding Abilities: Needs assist Patient Positioning: Upright in bed Baseline Vocal Quality: Normal Volitional Cough: Weak Volitional Swallow: Able to elicit    Oral/Motor/Sensory Function Overall Oral Motor/Sensory Function: Mild impairment Facial ROM: Reduced left Facial Symmetry: Abnormal symmetry left Facial Strength: Reduced left Facial Sensation: Within Functional Limits Lingual ROM: Reduced right;Reduced left Lingual Symmetry: Abnormal symmetry right;Abnormal symmetry left Lingual Strength: Reduced Lingual Sensation: Within Functional Limits Velum: Within Functional Limits Mandible: Within Functional Limits   Ice Chips Ice chips: Impaired Presentation: Spoon Oral Phase Impairments: Reduced lingual movement/coordination;Poor awareness of bolus;Impaired mastication Oral Phase Functional Implications: Oral holding   Thin Liquid Thin Liquid: Impaired Presentation: Cup;Spoon;Straw Oral Phase Impairments: Reduced labial seal Pharyngeal  Phase Impairments: Cough - Immediate;Other (comments) (cough noted with straw only )    Nectar Thick     Honey Thick     Puree Puree: Within functional limits   Solid  Thank you,   Renato Gails. Megan Salon, MS, CCC-SLP  Speech-Language Pathologist [phone]       Solid: Impaired Presentation: Self Fed Oral Phase Impairments: Poor awareness of bolus;Reduced lingual movement/coordination;Reduced labial seal;Impaired mastication Oral Phase Functional Implications: Impaired mastication;Oral residue;Oral holding        Luther Redo 06/27/2016,2:53  PM

## 2016-06-27 NOTE — Clinical Social Work Placement (Signed)
   CLINICAL SOCIAL WORK PLACEMENT  NOTE  Date:  06/27/2016  Patient Details  Name: Keith Young MRN: [id number] Date of Birth: 02/08/1951  Clinical Social Work is seeking post-discharge placement for this patient at the [place] level of care (*CSW will initial, date and re-position this form in  chart as items are completed):  Yes   Patient/family provided with [place] Work Department's list of facilities offering this level of care within the geographic area requested by the patient (or if unable, by the patient's family).  Yes   Patient/family informed of their freedom to choose among providers that offer the needed level of care, that participate in Medicare, Medicaid or managed care program needed by the patient, have an available bed and are willing to accept the patient.  Yes   Patient/family informed of [facility]'s ownership interest in [facility] and [facility], as well as of the fact that they are under no obligation to receive care at these facilities.  PASRR submitted to EDS on       PASRR number received on       Existing PASRR number confirmed on 06/27/16     FL2 transmitted to all facilities in geographic area requested by pt/family on 06/27/16     FL2 transmitted to all facilities within larger geographic area on       Patient informed that his/her managed care company has contracts with or will negotiate with certain facilities, including the following:        Yes   Patient/family informed of bed offers received.  Patient chooses bed at [facility]     Physician recommends and patient chooses bed at      Patient to be transferred to [facility] on  .  Patient to be transferred to facility by       Patient family notified on   of transfer.  Name of family member notified:        PHYSICIAN       Additional Comment:    _______________________________________________ Salome Arnt, LCSW 06/27/2016, 1:51 PM [phone]

## 2016-06-27 NOTE — Care Management Note (Signed)
Case Management Note  Patient Details  Name: Keith Young MRN: [id number] Date of Birth: 10/12/1950  Subjective/Objective:                  Pt admitted with CVA. He lives at [facility]. Pt active with [facility] PTA. PT has made SNF recommendation and pt/family agreeable. CSW is working with pt/family on placement options. Romualdo Bolk, of [facility], aware of admission and DC plan.   Action/Plan: No CM needs anticipated.   Expected Discharge Date:     06/30/2016             Expected Discharge Plan:  Skilled Nursing Facility  In-House Referral:  Clinical Social Work  Discharge planning Services  CM Consult  Post Acute Care Choice:  NA Choice offered to:  NA  Status of Service:  Completed.   Sherald Barge, RN 06/27/2016, 3:38 PM

## 2016-06-27 NOTE — NC FL2 (Deleted)
[place] MEDICAID FL2 LEVEL OF CARE SCREENING TOOL     IDENTIFICATION  Patient Name: Keith Young Birthdate: 15-Sep-1950 Sex: male Admission Date (Current Location): 06/26/2016  [facility] and [place] Number:  [employer] and Address:  [place] [address], [place]      Provider Number: [id number]  Attending Physician Name and Address:  Sinda Du, MD  Relative Name and Phone Number:       Current Level of Care: Hospital Recommended Level of Care: [place] Prior Approval Number:    Date Approved/Denied:   PASRR Number: [id number]  Discharge Plan: SNF    Current Diagnoses: Patient Active Problem List   Diagnosis Date Noted  . Altered mental status 06/26/2016  . CVA (cerebral vascular accident) ([place]) 06/26/2016  . Severe malnutrition ([place]) 06/26/2016  . HCAP (healthcare-associated pneumonia) 06/04/2016  . Mucosal abnormality of stomach   . Gastritis and gastroduodenitis 09/30/2015  . Heme positive stool 09/30/2015  . Pancytopenia ([place]) 09/30/2015  . Chest pain 04/28/2015  . Elevated troponin 04/28/2015  . ICD (implantable cardioverter-defibrillator) in place 01/22/2015  . Anemia 09/30/2014  . Gastric polyp   . Reflux esophagitis   . Elevated LFTs 03/26/2014  . Anemia in chronic kidney disease 03/26/2014  . Carotid bruit 12/18/2013  . Acute on chronic systolic heart failure ([place]) 10/24/2013  . CHF (congestive heart failure), NYHA class III ([place]) 10/23/2013  . Cough 10/23/2013  . CKD (chronic kidney disease) 10/23/2013  . Diabetes mellitus type 2 in nonobese ([place]) 10/23/2013  . Essential hypertension, benign 10/23/2013  . Hyperkalemia 10/23/2013  . CAD (coronary artery disease) 10/23/2013  . History of stroke 10/23/2013  . Anemia of chronic kidney failure 10/23/2013  . Other and unspecified hyperlipidemia 10/23/2013    Orientation RESPIRATION BLADDER Height & Weight     Self, Place  Normal  Incontinent Weight: 116 lb 12.8 oz (53 kg) Height:  5\' 3"  (160 cm)  BEHAVIORAL SYMPTOMS/MOOD NEUROLOGICAL BOWEL NUTRITION STATUS  Other (Comment) (none)  (n/a) Incontinent Diet (Carb modified)  AMBULATORY STATUS COMMUNICATION OF NEEDS Skin   Limited Assist Verbally Normal                       Personal Care Assistance Level of Assistance    Bathing Assistance: Limited assistance Feeding assistance: Limited assistance Dressing Assistance: Limited assistance     Functional Limitations Info  Sight, Hearing, Speech Sight Info: Adequate Hearing Info: Adequate Speech Info: Adequate    SPECIAL CARE FACTORS FREQUENCY  PT (By licensed PT)     PT Frequency: 5              Contractures      Additional Factors Info  Insulin Sliding Scale Code Status Info: Full code Allergies Info: No known allergies           Current Medications (06/27/2016):  This is the current hospital active medication list Current Facility-Administered Medications  Medication Dose Route Frequency Provider Last Rate Last Dose  .  stroke: mapping our early stages of recovery book   Does not apply Once Karmen Bongo, MD      . 0.9 %  sodium chloride infusion   Intravenous Continuous Francine Graven, DO 100 mL/hr at 06/27/16 1003    . acetaminophen (TYLENOL) tablet 650 mg  650 mg Oral Q4H PRN Karmen Bongo, MD       Or  . acetaminophen (TYLENOL) solution 650 mg  650 mg Per  Tube Q4H PRN Karmen Bongo, MD       Or  . acetaminophen (TYLENOL) suppository 650 mg  650 mg Rectal Q4H PRN Karmen Bongo, MD      . aspirin suppository 300 mg  300 mg Rectal Daily Karmen Bongo, MD       Or  . aspirin tablet 325 mg  325 mg Oral Daily Karmen Bongo, MD      . atorvastatin (LIPITOR) tablet 80 mg  80 mg Oral Daily Karmen Bongo, MD      . dextrose 50 % solution 25 mL  25 mL Intravenous Once Sinda Du, MD      . dextrose 50 % solution           . digoxin (LANOXIN) tablet 62.5 mcg  62.5 mcg Oral Daily  Karmen Bongo, MD      . enoxaparin (LOVENOX) injection 40 mg  40 mg Subcutaneous Q24H Karmen Bongo, MD      . insulin aspart (novoLOG) injection 0-15 Units  0-15 Units Subcutaneous TID WC Karmen Bongo, MD      . isosorbide dinitrate (ISORDIL) tablet 40 mg  40 mg Oral TID Karmen Bongo, MD      . mirabegron ER [facility]) tablet 25 mg  25 mg Oral Daily Karmen Bongo, MD         Discharge Medications: Please see discharge summary for a list of discharge medications.  Relevant Imaging Results:  Relevant Lab Results:   Additional Information SSN: [id number]  Salome Arnt, [place]

## 2016-06-27 NOTE — Clinical Social Work Note (Signed)
Clinical Social Work Assessment  Patient Details  Name: Keith Young MRN: [id number] Date of Birth: [birth year]  Date of referral:  06/27/16               Reason for consult:  Discharge Planning                Permission sought to share information with:    Permission granted to share information::     Name::        Agency::     Relationship::     Contact Information:     Housing/Transportation Living arrangements for the past 2 months:  [place] of Information:  Facility, Other (Comment Required) Production designer, theatre/television/film is brother-in-law) Patient Interpreter Needed:  None Criminal Activity/Legal Involvement Pertinent to Current Situation/Hospitalization:  No - Comment as needed Significant Relationships:  Siblings Lives with:  Facility Resident Do you feel safe going back to the place where you live?  Yes Need for family participation in patient care:  Yes (Comment)  Care giving concerns:  None reported. Pt long term resident at [facility] group home.    Social Worker assessment / plan:  CSW spoke with Jeronimo Greaves, facility administrator at [facility] group home where pt has been a resident for almost 3 years. Pt oriented to self and place. Pt's sister is John's wife. He reports that children are not involved. Pt has been requiring extensive assist with ADLs and ambulates with walker and assist. [facility] was receiving [facility], RN, SW, [place], OT. Facility anticipates that pt should be able to return at d/c.   Employment status:  Disabled (Comment on whether or not currently receiving Disability) Insurance information:  Managed Medicare PT Recommendations:  Not assessed at this time Information / Referral to community resources:  Other (Comment Required) (Return to [place])  Patient/Family's Response to care:  Pt's brother-in-law/facility administrator plans on return to group home when medically stable.   Patient/Family's Understanding of and Emotional Response to  Diagnosis, Current Treatment, and Prognosis:  Unable to discuss with pt due to AMS.   Emotional Assessment Appearance:  Appears stated age Attitude/Demeanor/Rapport:  Unable to Assess Affect (typically observed):  Unable to Assess Orientation:  Oriented to Self, Oriented to Place Alcohol / Substance use:  Not Applicable Psych involvement (Current and /or in the community):  No (Comment)  Discharge Needs  Concerns to be addressed:  Discharge Planning Concerns Readmission within the last 30 days:  Yes Current discharge risk:  Physical Impairment Barriers to Discharge:  Continued Medical Work up   Salome Arnt, [place] 06/27/2016, 10:36 AM [phone]

## 2016-06-27 NOTE — Evaluation (Addendum)
Physical Therapy Evaluation Patient Details Name: Keith Young MRN: [id number] DOB: [birth year] Today's Date: 06/27/2016   History of Present Illness  66 y.o. male with hx of systolic CHF EF 46% s/p ICD, CKD3, HLD, NSVT, prior CVA, DM2, HTN, brought to the ER from a group home as he has been intermittently more confused these past few days.  He also complained of sputum productive coughs, but no chest pain, fever, or chills.  Evaluation in the ER showed CXR with PNA.  Head CT shows a stroke that is new since April of last year. He can do MRI because he has a pacer defibrillator  Clinical Impression  Pt received in bed, sister present, and pt is agreeable to PT evaluation.  Pt states that he uses his RW for ambulation at home, but normally does not require assistance.  His sister states that for the past week he has required assistance for ambulation.  Pt states that he is independent with ADL's, while sister states that he requires assistance with both dressing and bathing. Sister also states that he has had 4-5 falls since his last admission, when he was discharged on 06/09/2015.  During PT evaluation, he required Min A for supine<>sit.  Once sitting on the EOB, he requires constant supervision due to poor righting reaction with tendency to loose balance posteriorly or to either right or left side.  He was able to correct with vc's to do so.  He requires increased time to complete all tasks, and vc's for initiation of gait.  He was only able to ambulate 63ft with RW and Min A today compared to 53ft he ambulated during last admission.  Pt would benefit from SNF at this time due to increased level of assistance he is requiring for all functional mobility and ADL's, as well as his increased risk for falling.   Follow Up Recommendations SNF    Equipment Recommendations  None recommended by PT    Recommendations for Other Services       Precautions / Restrictions Precautions Precautions:  Fall Precaution Comments: 4-5 falls since his last admission.  Restrictions Weight Bearing Restrictions: No      Mobility  Bed Mobility Overal bed mobility: Needs Assistance Bed Mobility: Supine to Sit     Supine to sit: Min assist        Transfers Overall transfer level: Needs assistance Equipment used: Rolling walker (2 wheeled) Transfers: Sit to/from Stand Sit to Stand: Min guard            Ambulation/Gait Ambulation/Gait assistance: Min guard;Min assist Ambulation Distance (Feet): 30 Feet Assistive device: Rolling walker (2 wheeled) Gait Pattern/deviations: Shuffle;Ataxic;Step-to pattern     General Gait Details: Pt demonstrates decreased cadence, and requires vc's for gait initiation.  Min A provided due to noted R LE ataxia with mild unsteadiness.  Gait distance limited due to pt expressing feeling fatigued.    Stairs            Wheelchair Mobility    Modified Rankin (Stroke Patients Only)       Balance Overall balance assessment: Needs assistance;History of Falls Sitting-balance support: Feet supported;Bilateral upper extremity supported Sitting balance-Leahy Scale: Fair Sitting balance - Comments: Pt requires supervision for static sitting balance.  He does not demonstrate righting reaction, and slowly begins to lean posteriorly, or to the right or left.  He is able to correct with vc's,    Standing balance support: Bilateral upper extremity supported Standing balance-Leahy Scale: Poor Standing balance comment:  UE's supported on RW.                              Pertinent Vitals/Pain Pain Assessment: No/denies pain    Home Living   Living Arrangements: Group Home (Family Care home) Available Help at Discharge: Available 24 hours/day (sister and brother in law, and another lady)   Home Access: Level entry     Home Layout: One level Home Equipment: Bedside commode;Walker - 2 wheels;Cane - single point;Shower seat;Wheelchair -  Regulatory affairs officer / Transfers Assistance Needed: Pt states he was using a RW for ambulation.  Sister states that for the last week he has required assistance for ambulation.  ADL's / Homemaking Assistance Needed: assistance with dressing and bathing.         Hand Dominance   Dominant Hand: Right    Extremity/Trunk Assessment   Upper Extremity Assessment Upper Extremity Assessment: Generalized weakness (R seems slightly weaker than the left)    Lower Extremity Assessment Lower Extremity Assessment: Generalized weakness;RLE deficits/detail RLE Deficits / Details: decreased coordination noted with taking steps with gait RLE Coordination: decreased gross motor;decreased fine motor       Communication      Cognition Arousal/Alertness: Awake/alert Behavior During Therapy: WFL for tasks assessed/performed   Area of Impairment: Orientation Orientation Level: Disoriented to;Time;Situation (Sister states that he would normally be able to express the date. )                  General Comments      Exercises     Assessment/Plan    PT Assessment Patient needs continued PT services  PT Problem List Decreased strength;Decreased activity tolerance;Decreased balance;Decreased mobility;Decreased coordination;Decreased cognition;Decreased knowledge of use of DME;Decreased safety awareness;Decreased knowledge of precautions          PT Treatment Interventions DME instruction;Gait training;Functional mobility training;Therapeutic activities;Therapeutic exercise;Balance training;Patient/family education;Cognitive remediation    PT Goals (Current goals can be found in the Care Plan section)  Acute Rehab PT Goals Patient Stated Goal: To regain balance and endurance PT Goal Formulation: With patient/family Time For Goal Achievement: 07/04/16 Potential to Achieve Goals: Fair    Frequency     Barriers to discharge        Co-evaluation                End of Session Equipment Utilized During Treatment: Gait belt Activity Tolerance: Patient tolerated treatment well Patient left: in bed;with call bell/phone within reach;with family/visitor present Nurse Communication: Mobility status Jenny Reichmann, RN notified of pt's location, and mobility sheet left hanging in the room. )    Functional Assessment Tool Used: [employer] "6-clicks"  Functional Limitation: Mobility: Walking and moving around Mobility: Walking and Moving Around Current Status [phone]): At least 40 percent but less than 60 percent impaired, limited or restricted Mobility: Walking and Moving Around Goal Status [phone]): At least 20 percent but less than 40 percent impaired, limited or restricted    Time: 3220-2542 PT Time Calculation (min) (ACUTE ONLY): 39 min   Charges:   PT Evaluation $PT Eval Low Complexity: 1 Procedure PT Treatments $Gait Training: 8-22 mins $Therapeutic Activity: 8-22 mins   PT G Codes:   PT G-Codes **NOT FOR INPATIENT CLASS** Functional Assessment Tool Used: [employer] "6-clicks"  Functional Limitation: Mobility: Walking and moving around Mobility: Walking and Moving Around Current Status [phone]): At least  40 percent but less than 60 percent impaired, limited or restricted Mobility: Walking and Moving Around Goal Status [phone]): At least 20 percent but less than 40 percent impaired, limited or restricted   Beth Muriel Hannold, PT, DPT X: [phone]

## 2016-06-27 NOTE — Clinical Social Work Placement (Signed)
Discussed PT recommendation for SNF with pt and sister who are agreeable and request [facility] placement. Aware of [employer] authorization process.   CLINICAL SOCIAL WORK PLACEMENT  NOTE  Date:  06/27/2016  Patient Details  Name: Keith Young MRN: [id number] Date of Birth: 1951-03-25  Clinical Social Work is seeking post-discharge placement for this patient at the [place] level of care (*CSW will initial, date and re-position this form in  chart as items are completed):  Yes   Patient/family provided with [place] Work Department's list of facilities offering this level of care within the geographic area requested by the patient (or if unable, by the patient's family).  Yes   Patient/family informed of their freedom to choose among providers that offer the needed level of care, that participate in Medicare, Medicaid or managed care program needed by the patient, have an available bed and are willing to accept the patient.  Yes   Patient/family informed of [facility]'s ownership interest in [facility] and [facility], as well as of the fact that they are under no obligation to receive care at these facilities.  PASRR submitted to EDS on       PASRR number received on       Existing PASRR number confirmed on 06/27/16     FL2 transmitted to all facilities in geographic area requested by pt/family on 06/27/16     FL2 transmitted to all facilities within larger geographic area on       Patient informed that his/her managed care company has contracts with or will negotiate with certain facilities, including the following:            Patient/family informed of bed offers received.  Patient chooses bed at       Physician recommends and patient chooses bed at      Patient to be transferred to   on  .  Patient to be transferred to facility by       Patient family notified on   of transfer.  Name of family member notified:         PHYSICIAN       Additional Comment:    _______________________________________________ Salome Arnt, LCSW 06/27/2016, 12:04 PM

## 2016-06-27 NOTE — Progress Notes (Signed)
*  PRELIMINARY RESULTS* Echocardiogram 2D Echocardiogram has been performed.  Keith Young 06/27/2016, 10:51 AM

## 2016-06-27 NOTE — Consult Note (Addendum)
Keith River A. Merlene Laughter, MD     [email or web address]          Keith Young is an 66 y.o. male.   ASSESSMENT/PLAN: Acute encephalopathy of unclear etiology but likely due to multifactorial toxic metabolic disturbances particularly acute renal failure and hyponatremia.  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  Multiple small and large vessel strokes. This increases the risk of vascular dementia.  Recommendations: EEG. Additional dementia lives. 30 day they monitor to evaluate for atrial fibrillation given his multiple strokes.    The patient is a 66 year old male who was sitting of the hospital about two weeks ago with altered mental status. He presented with pneumonia this was thought to the etiology of the confusion. It appears that he never really returned to baseline however. Again, he presented today with similar complaints by family member of confusion at all about status. The workup has been sniffed and for pancytopenia. The pancytopenia is mild and appears to been present when he was seen in the hospital about two weeks ago. The patient does not report having any complaints of this time. The review systems Limited given the confusion.      GENERAL: Thin  man who is in no acute distress.  HEENT: This is normal.  ABDOMEN: soft  EXTREMITIES: No edema   BACK: This is normal.  SKIN: Normal by inspection.    MENTAL STATUS: He is awake and alert. He initially stated that it was 2017 but then at times he states that his 2018. No studies January. He knows that he's in the hospital. There is no dysarthria. He cannot give a good history as to why he is in the hospital.  CRANIAL NERVES: Pupils are equal, round and reactive to light and accomodation; extra ocular movements are full, there is no significant nystagmus; visual fields are full; upper and lower facial muscles are normal in  strength and symmetric, there is no flattening of the nasolabial folds; tongue is midline; uvula is midline; shoulder elevation is normal.  MOTOR: Normal tone, bulk and strength - except for proximal weakness greatest 4/5 in arms and legs; no pronator drift.  COORDINATION: Left finger to nose is normal, right finger to nose is normal, No rest tremor; no intention tremor; no postural tremor; no bradykinesia.  REFLEXES: Deep tendon reflexes are symmetrical and normal. Babinski reflexes are extensor bilaterally.   SENSATION: Normal to light touch, temperature, and pinprick.      Blood pressure (!) 170/81, pulse 85, temperature 99.6 F (37.6 C), temperature source Oral, resp. rate (!) 22, height '5\' 3"'  (1.6 m), weight 116 lb 12.8 oz (53 kg), SpO2 100 %.  Past Medical History:  Diagnosis Date  . AICD (automatic cardioverter/defibrillator) present   . Alcoholism in remission ([place])   . Anemia   . Aortic insufficiency   . Arthritis   . At moderate risk for fall   . Bell's palsy   . Cardiomyopathy ([place])   . Chronic systolic heart failure (HCC)    NYHA Class III  . CKD (chronic kidney disease) stage 3, GFR 30-59 ml/min   . COPD (chronic obstructive pulmonary disease) ([place])   . Essential hypertension, benign   . Hyperlipidemia   . Noncompliance with medication regimen   . Nonischemic cardiomyopathy (HCC)    EF 15-20%  . NSVT (nonsustained ventricular tachycardia) ([place])   . Numbness of right jaw    Had a stoke in  01/2013. Numbness is occasional, especially when trying to chew.  . Productive cough 08/2013   With brown sputum   . SOB (shortness of breath) on exertion 08/2013  . Stroke ([place]) 01/2013   weakness of right side from CVA  . Type 2 diabetes mellitus ([place])   . Uses hearing aid 2014   recently received new hearing aids    Past Surgical History:  Procedure Laterality Date  . BIOPSY N/A 04/10/2014   Procedure: GASTRIC BIOPSY;  Surgeon: Daneil Dolin, MD;  Location: [facility];   Service: Endoscopy;  Laterality: N/A;  . BIOPSY  10/12/2015   Procedure: BIOPSY;  Surgeon: Daneil Dolin, MD;  Location: [facility];  Service: Endoscopy;;  stomach bx's  . CARDIAC DEFIBRILLATOR PLACEMENT  11/12/12   [employer] Inogen MINI ICD implanted in [place] at [place] AVR per Dr Nelly Laurence note  . COLONOSCOPY WITH PROPOFOL N/A 04/10/2014   RMR: Colonic Diverticulosis  . ESOPHAGOGASTRODUODENOSCOPY (EGD) WITH PROPOFOL N/A 04/10/2014   RMR: Mild erosive reflux esophagitis. Multiple antral polyps likely hyperplastic status post removal by hot snare cautery technique. Diffusely abnormal stomach status post gastric biopsy I suspect some of patients anemaia may be due to intermittent oozing from the stomach. It would be difficult and a risky proposition to attempt complete removal of all of his gastric polyps.  . ESOPHAGOGASTRODUODENOSCOPY (EGD) WITH PROPOFOL N/A 10/12/2015   Procedure: ESOPHAGOGASTRODUODENOSCOPY (EGD) WITH PROPOFOL;  Surgeon: Daneil Dolin, MD;  [facility];  Service: Endoscopy;  Laterality: N/A;  1638 - moved to 9:15  . POLYPECTOMY N/A 04/10/2014   Procedure: GASTRIC POLYPECTOMY;  Surgeon: Daneil Dolin, MD;  Location: [facility];  Service: Endoscopy;  Laterality: N/A;  . RIGHT HEART CATHETERIZATION N/A 02/06/2014   Procedure: RIGHT HEART CATH;  Surgeon: Larey Dresser, MD;  Location: [facility] CATH LAB;  Service: Cardiovascular;  Laterality: N/A;    Family History  Problem Relation Age of Onset  . Kidney disease Mother   . Kidney disease Sister   . Colon cancer Neg Hx     Social History:  reports that he quit smoking about 22 years ago. His smoking use included Cigarettes. He has never used smokeless tobacco. He reports that he drinks alcohol. He reports that he does not use drugs.  Allergies: No Known Allergies  Medications: Prior to Admission medications   Medication Sig Start Date End Date Taking?  Authorizing Provider  aspirin 81 MG EC tablet Take 1 tablet (81 mg total) by mouth daily. 12/17/13  Yes Larey Dresser, MD  atorvastatin (LIPITOR) 80 MG tablet Take 80 mg by mouth daily.    Yes Historical Provider, MD  carvedilol (COREG) 25 MG tablet Take 12.5 mg by mouth 2 (two) times daily with a meal.    Yes Historical Provider, MD  Cholecalciferol (VITAMIN D3) 2000 UNITS TABS Take 2,000 mg by mouth daily.   Yes Historical Provider, MD  digoxin (LANOXIN) 0.125 MG tablet TAKE 1/2 TABLET BY MOUTH DAILY 03/15/16  Yes Jolaine Artist, MD  ferrous sulfate 325 (65 FE) MG tablet Take 325 mg by mouth daily with breakfast.   Yes Historical Provider, MD  furosemide (LASIX) 40 MG tablet Take 40 mg by mouth daily.   Yes Historical Provider, MD  isosorbide dinitrate (ISORDIL) 20 MG tablet Take 40 mg by mouth 3 (three) times daily.  09/29/15  Yes Historical Provider, MD  losartan (COZAAR) 25 MG tablet  Take 25 mg by mouth daily.   Yes Historical Provider, MD  mirabegron ER (MYRBETRIQ) 25 MG TB24 tablet Take 25 mg by mouth daily.   Yes Historical Provider, MD  mupirocin ointment (BACTROBAN) 2 % Place 1 application into the nose 2 (two) times daily.   Yes Historical Provider, MD  NOVOLOG FLEXPEN 100 UNIT/ML FlexPen Inject 1-28 Units into the skin daily. Up to 28 units daily  By sliding scale 09/02/15  Yes Historical Provider, MD  pantoprazole (PROTONIX) 40 MG tablet TAKE ONE TABLET   BY MOUTH   DAILY 05/25/16  Yes Mahala Menghini, PA-C  urea (CARMOL) 40 % CREA Apply 1 application topically 2 (two) times daily. 05/27/16  Yes Historical Provider, MD  mirtazapine (REMERON) 7.5 MG tablet Take 1 tablet by mouth daily. 06/09/16   Historical Provider, MD    Scheduled Meds: . aspirin  300 mg Rectal Daily   Or  . aspirin  325 mg Oral Daily  . atorvastatin  80 mg Oral Daily  . digoxin  62.5 mcg Oral Daily  . enoxaparin (LOVENOX) injection  40 mg Subcutaneous Q24H  . feeding supplement (ENSURE ENLIVE)  237 mL Oral BID  BM  . insulin aspart  0-15 Units Subcutaneous TID WC  . isosorbide dinitrate  40 mg Oral TID  . mirabegron ER  25 mg Oral Daily  . multivitamin with minerals  1 tablet Oral Daily   Continuous Infusions: . sodium chloride 100 mL/hr at 06/27/16 1003   PRN Meds:.acetaminophen **OR** acetaminophen (TYLENOL) oral liquid 160 mg/5 mL **OR** acetaminophen     Results for orders placed or performed during the hospital encounter of 06/26/16 (from the past 48 hour(s))  Comprehensive metabolic panel     Status: Abnormal   Collection Time: 06/26/16  4:28 PM  Result Value Ref Range   Sodium 133 (L) 135 - 145 mmol/L   Potassium 4.2 3.5 - 5.1 mmol/L   Chloride 105 101 - 111 mmol/L   CO2 24 22 - 32 mmol/L   Glucose, Bld 126 (H) 65 - 99 mg/dL   BUN 38 (H) 6 - 20 mg/dL   Creatinine, Ser 2.05 (H) 0.61 - 1.24 mg/dL   Calcium 8.7 (L) 8.9 - 10.3 mg/dL   Total Protein 7.1 6.5 - 8.1 g/dL   Albumin 2.4 (L) 3.5 - 5.0 g/dL   AST 54 (H) 15 - 41 U/L   ALT 42 17 - 63 U/L   Alkaline Phosphatase 70 38 - 126 U/L   Total Bilirubin 0.4 0.3 - 1.2 mg/dL   GFR calc non Af Amer 33 (L) >60 mL/min   GFR calc Af Amer 38 (L) >60 mL/min    Comment: (NOTE) The eGFR has been calculated using the CKD EPI equation. This calculation has not been validated in all clinical situations. eGFR's persistently <60 mL/min signify possible Chronic Kidney Disease.    Anion gap 4 (L) 5 - 15  Troponin I     Status: Abnormal   Collection Time: 06/26/16  4:28 PM  Result Value Ref Range   Troponin I 0.38 (HH) <0.03 ng/mL    Comment: CRITICAL RESULT CALLED TO, READ BACK BY AND VERIFIED WITH: BLACKBURN,C. AT 3220 ON 06/26/2016 BY EVA   Lactic acid, plasma     Status: None   Collection Time: 06/26/16  4:28 PM  Result Value Ref Range   Lactic Acid, Venous 1.1 0.5 - 1.9 mmol/L  CBC with Differential     Status: Abnormal   Collection  Time: 06/26/16  4:28 PM  Result Value Ref Range   WBC 1.6 (L) 4.0 - 10.5 K/uL    Comment: WHITE  COUNT CONFIRMED ON SMEAR   RBC 3.73 (L) 4.22 - 5.81 MIL/uL   Hemoglobin 10.4 (L) 13.0 - 17.0 g/dL   HCT 32.2 (L) 39.0 - 52.0 %   MCV 86.3 78.0 - 100.0 fL   MCH 27.9 26.0 - 34.0 pg   MCHC 32.3 30.0 - 36.0 g/dL   RDW 13.0 11.5 - 15.5 %   Platelets 108 (L) 150 - 400 K/uL    Comment: SPECIMEN CHECKED FOR CLOTS PLATELET COUNT CONFIRMED BY SMEAR    Neutrophils Relative % 47 %   Neutro Abs 0.7 (L) 1.7 - 7.7 K/uL   Lymphocytes Relative 36 %   Lymphs Abs 0.6 (L) 0.7 - 4.0 K/uL   Monocytes Relative 15 %   Monocytes Absolute 0.2 0.1 - 1.0 K/uL   Eosinophils Relative 1 %   Eosinophils Absolute 0.0 0.0 - 0.7 K/uL   Basophils Relative 1 %   Basophils Absolute 0.0 0.0 - 0.1 K/uL  Digoxin level     Status: Abnormal   Collection Time: 06/26/16  4:31 PM  Result Value Ref Range   Digoxin Level 0.7 (L) 0.8 - 2.0 ng/mL  TSH     Status: Abnormal   Collection Time: 06/26/16  4:31 PM  Result Value Ref Range   TSH 0.003 (L) 0.350 - 4.500 uIU/mL    Comment: Performed by a 3rd Generation assay with a functional sensitivity of <=0.01 uIU/mL.  POC occult blood, ED     Status: None   Collection Time: 06/26/16  4:32 PM  Result Value Ref Range   Fecal Occult Bld NEGATIVE NEGATIVE  Lactic acid, plasma     Status: None   Collection Time: 06/26/16  6:18 PM  Result Value Ref Range   Lactic Acid, Venous 0.7 0.5 - 1.9 mmol/L  Troponin I     Status: Abnormal   Collection Time: 06/26/16 11:00 PM  Result Value Ref Range   Troponin I 0.36 (HH) <0.03 ng/mL    Comment: CRITICAL VALUE NOTED.  VALUE IS CONSISTENT WITH PREVIOUSLY REPORTED AND CALLED VALUE.  Troponin I     Status: Abnormal   Collection Time: 06/27/16  6:55 AM  Result Value Ref Range   Troponin I 0.31 (HH) <0.03 ng/mL    Comment: CRITICAL VALUE NOTED.  VALUE IS CONSISTENT WITH PREVIOUSLY REPORTED AND CALLED VALUE.  Lipid panel     Status: Abnormal   Collection Time: 06/27/16  6:55 AM  Result Value Ref Range   Cholesterol 78 0 - 200 mg/dL    Triglycerides 110 <150 mg/dL   HDL 13 (L) >40 mg/dL   Total CHOL/HDL Ratio 6.0 RATIO   VLDL 22 0 - 40 mg/dL   LDL Cholesterol 43 0 - 99 mg/dL    Comment:        Total Cholesterol/HDL:CHD Risk Coronary Heart Disease Risk Table                     Men   Women  1/2 Average Risk   3.4   3.3  Average Risk       5.0   4.4  2 X Average Risk   9.6   7.1  3 X Average Risk  23.4   11.0        Use the calculated Patient Ratio above and the CHD Risk Table to determine  the patient's CHD Risk.        ATP III CLASSIFICATION (LDL):  <100     mg/dL   Optimal  100-129  mg/dL   Near or Above                    Optimal  130-159  mg/dL   Borderline  160-189  mg/dL   High  >190     mg/dL   Very High   Glucose, capillary     Status: Abnormal   Collection Time: 06/27/16  8:09 AM  Result Value Ref Range   Glucose-Capillary 56 (L) 65 - 99 mg/dL  Glucose, capillary     Status: Abnormal   Collection Time: 06/27/16  8:52 AM  Result Value Ref Range   Glucose-Capillary 106 (H) 65 - 99 mg/dL  Glucose, capillary     Status: Abnormal   Collection Time: 06/27/16 11:44 AM  Result Value Ref Range   Glucose-Capillary 61 (L) 65 - 99 mg/dL  Glucose, capillary     Status: None   Collection Time: 06/27/16  1:16 PM  Result Value Ref Range   Glucose-Capillary 84 65 - 99 mg/dL  Troponin I     Status: Abnormal   Collection Time: 06/27/16  1:25 PM  Result Value Ref Range   Troponin I 0.36 (HH) <0.03 ng/mL    Comment: CRITICAL RESULT CALLED TO, READ BACK BY AND VERIFIED WITH: MORRIS,L AT 1440 ON 06/27/2016 BY ISLEY,B   Glucose, capillary     Status: Abnormal   Collection Time: 06/27/16  4:44 PM  Result Value Ref Range   Glucose-Capillary 109 (H) 65 - 99 mg/dL   Comment 1 Notify RN    Comment 2 Document in Chart     Studies/Results:  HEAD CT FINDINGS: Brain: A left cerebellar infarct is noted, age indeterminate but new since 09/22/2015. There is no evidence of hemorrhage.  Atrophy, severe chronic  small-vessel white matter ischemic changes and remote infarcts within the right cerebellum, pons, basal ganglia, thalami and right parietal regions again noted.  Vascular: Intracranial atherosclerotic calcifications noted.  Skull: Normal. Negative for fracture or focal lesion.  Sinuses/Orbits: No acute finding.  Other: None.  IMPRESSION: Age indeterminate left cerebellar infarct without hemorrhage, new since 09/22/2015.  Atrophy, chronic small-vessel white matter ischemic changes and remote infarcts as described.       CAROTID DOPPLERS FINDINGS: Criteria: Quantification of carotid stenosis is based on velocity parameters that correlate the residual internal carotid diameter with NASCET-based stenosis levels, using the diameter of the distal internal carotid lumen as the denominator for stenosis measurement.  The following velocity measurements were obtained:  RIGHT  ICA:  56/14 cm/sec  CCA:  24/40 cm/sec  SYSTOLIC ICA/CCA RATIO:  0.9  DIASTOLIC ICA/CCA RATIO:  1.1  ECA:  69 cm/sec  LEFT  ICA:  52/15 cm/sec  CCA:  10/27 cm/sec  SYSTOLIC ICA/CCA RATIO:  0.6  DIASTOLIC ICA/CCA RATIO:  1.3  ECA:  73 cm/sec  RIGHT CAROTID ARTERY: Right carotid bifurcation and proximal ICA atherosclerotic vascular disease. No flow limiting stenosis. Degree of stenosis less than 50%. Similar finding noted on prior exam. Scratched  RIGHT VERTEBRAL ARTERY:  Patent with antegrade flow.  LEFT CAROTID ARTERY: Left carotid bifurcation and proximal ICA atherosclerotic vascular disease. No flow limiting stenosis. Degree of stenosis less 50%. Similar findings noted on prior exam.  LEFT VERTEBRAL ARTERY:  Patent with antegrade flow.  IMPRESSION: One Mild bilateral carotid bifurcation and proximal ICA atherosclerotic vascular  disease. Degree of stenosis less than 50% bilaterally. Similar findings noted on prior exam.  2. Vertebral arteries are patent  with antegrade flow .      The brain MRI CT scan is reviewed in person. There is evidence of focal hyper density and a supplemental relation involving the right parietal frontal region and also the right frontal region. 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 There is moderate global atrophy. There is evidence of tiny and supplemental relation involving the right pointing region and to some degree the thalami bilaterally. There is a moderate area of insulin formulation involving the very year cerebral reason on the left side. This is consistent with a infarct of undetermined age.     Sarha Bartelt A. Merlene Young, M.D.  Diplomate, Tax adviser of Psychiatry and Neurology ( Neurology). 06/27/2016, 6:36 PM

## 2016-06-27 NOTE — Progress Notes (Signed)
Initial Nutrition Assessment  DOCUMENTATION CODES:   Non-severe (moderate) malnutrition in context of chronic illness  INTERVENTION:  Ensure Enlive po BID, each supplement provides 350 kcal and 20 grams of protein   Add MVI daily   NUTRITION DIAGNOSIS:   Malnutrition related to chronic illness as evidenced by mild depletion of body fat, mild depletion of muscle mass.   GOAL:   Patient will meet greater than or equal to 90% of their needs   MONITOR:   PO intake, Supplement acceptance, Labs, Weight trends  REASON FOR ASSESSMENT:   Consult/ Assess nutrition status    ASSESSMENT:  Patient lives in his sister's group home "[facility]" for the past 2 years. His hx is significant for ETOH abuse (in remission), Non- ischemic cardiomyopathy EF-15-20%, CHF, CKD-3, DM-2. COPD. He has been NPO waiting for ST to screen swallow function due to concern for aspiration. His appetite has been poor prior to admission per family. Patient says he is able to feed himself. He is edentulous and usually eats soft foods.    Nutrition-Focused physical exam completed. Findings are mild fat (arm and thoraic region) and muscle depletions (calves, clavicle, acromion, dorsal regions)  and no edema.     Diet Order:  DIET DYS 2 Room service appropriate? Yes; Fluid consistency: Thin  Skin:  Reviewed, no issues  Last BM:  1/28   Height:   Ht Readings from Last 1 Encounters:  06/26/16 5\' 3"  (1.6 m)    Weight:   Wt Readings from Last 1 Encounters:  06/26/16 116 lb 12.8 oz (53 kg)  Admit wt 59 kg and UBW-(57 kg)  Ideal Body Weight:  56 kg  BMI:  Body mass index is 20.69 kg/m.  Estimated Nutritional Needs:   Kcal:  2297-9892   (Based on admission wt of 59 kg)  Protein:  52-55 gr  Fluid:  1.5 liters daily  EDUCATION NEEDS:   Education needs no appropriate at this time  Colman Cater MS,RD,CSG,LDN Office: [phone] Pager: [phone]

## 2016-06-27 NOTE — NC FL2 (Signed)
[place] MEDICAID FL2 LEVEL OF CARE SCREENING TOOL     IDENTIFICATION  Patient Name: Keith Young Birthdate: September 06, 1950 Sex: male Admission Date (Current Location): 06/26/2016  [facility] and [place] Number:  [employer] and Address:  [facility] [address] [place]      Provider Number: [id number]  Attending Physician Name and Address:  Sinda Du, MD  Relative Name and Phone Number:       Current Level of Care: Hospital Recommended Level of Care: [place] Prior Approval Number:    Date Approved/Denied:   PASRR Number: [id number]  Discharge Plan: SNF    Current Diagnoses: Patient Active Problem List   Diagnosis Date Noted  . Altered mental status 06/26/2016  . CVA (cerebral vascular accident) ([place]) 06/26/2016  . Severe malnutrition ([place]) 06/26/2016  . HCAP (healthcare-associated pneumonia) 06/04/2016  . Mucosal abnormality of stomach   . Gastritis and gastroduodenitis 09/30/2015  . Heme positive stool 09/30/2015  . Pancytopenia ([place]) 09/30/2015  . Chest pain 04/28/2015  . Elevated troponin 04/28/2015  . ICD (implantable cardioverter-defibrillator) in place 01/22/2015  . Anemia 09/30/2014  . Gastric polyp   . Reflux esophagitis   . Elevated LFTs 03/26/2014  . Anemia in chronic kidney disease 03/26/2014  . Carotid bruit 12/18/2013  . Acute on chronic systolic heart failure ([place]) 10/24/2013  . CHF (congestive heart failure), NYHA class III ([place]) 10/23/2013  . Cough 10/23/2013  . CKD (chronic kidney disease) 10/23/2013  . Diabetes mellitus type 2 in nonobese ([place]) 10/23/2013  . Essential hypertension, benign 10/23/2013  . Hyperkalemia 10/23/2013  . CAD (coronary artery disease) 10/23/2013  . History of stroke 10/23/2013  . Anemia of chronic kidney failure 10/23/2013  . Other and unspecified hyperlipidemia 10/23/2013    Orientation RESPIRATION BLADDER Height & Weight     Self, Place  Normal  Incontinent Weight: 116 lb 12.8 oz (53 kg) Height:  5\' 3"  (160 cm)  BEHAVIORAL SYMPTOMS/MOOD NEUROLOGICAL BOWEL NUTRITION STATUS  Other (Comment) (none)  (n/a) Incontinent Diet (Carb modified)  AMBULATORY STATUS COMMUNICATION OF NEEDS Skin   Limited Assist Verbally Normal                       Personal Care Assistance Level of Assistance    Bathing Assistance: Limited assistance Feeding assistance: Limited assistance Dressing Assistance: Limited assistance     Functional Limitations Info  Sight, Hearing, Speech Sight Info: Adequate Hearing Info: Adequate Speech Info: Adequate    SPECIAL CARE FACTORS FREQUENCY  PT (By licensed PT)     PT Frequency: 5              Contractures      Additional Factors Info  Insulin Sliding Scale Code Status Info: Full code Allergies Info: No known allergies           Current Medications (06/27/2016):  This is the current hospital active medication list Current Facility-Administered Medications  Medication Dose Route Frequency Provider Last Rate Last Dose  .  stroke: mapping our early stages of recovery book   Does not apply Once Karmen Bongo, MD      . 0.9 %  sodium chloride infusion   Intravenous Continuous Francine Graven, DO 100 mL/hr at 06/27/16 1003    . acetaminophen (TYLENOL) tablet 650 mg  650 mg Oral Q4H PRN Karmen Bongo, MD       Or  . acetaminophen (TYLENOL) solution 650 mg  650 mg Per  Tube Q4H PRN Karmen Bongo, MD       Or  . acetaminophen (TYLENOL) suppository 650 mg  650 mg Rectal Q4H PRN Karmen Bongo, MD      . aspirin suppository 300 mg  300 mg Rectal Daily Karmen Bongo, MD       Or  . aspirin tablet 325 mg  325 mg Oral Daily Karmen Bongo, MD      . atorvastatin (LIPITOR) tablet 80 mg  80 mg Oral Daily Karmen Bongo, MD      . dextrose 50 % solution 25 mL  25 mL Intravenous Once Sinda Du, MD      . dextrose 50 % solution           . digoxin (LANOXIN) tablet 62.5 mcg  62.5 mcg Oral Daily  Karmen Bongo, MD      . enoxaparin (LOVENOX) injection 40 mg  40 mg Subcutaneous Q24H Karmen Bongo, MD      . insulin aspart (novoLOG) injection 0-15 Units  0-15 Units Subcutaneous TID WC Karmen Bongo, MD      . isosorbide dinitrate (ISORDIL) tablet 40 mg  40 mg Oral TID Karmen Bongo, MD      . mirabegron ER [name]) tablet 25 mg  25 mg Oral Daily Karmen Bongo, MD         Discharge Medications: Please see discharge summary for a list of discharge medications.  Relevant Imaging Results:  Relevant Lab Results:   Additional Information SSN: [id number].   Benay Pike [place], [place]

## 2016-06-27 NOTE — Progress Notes (Signed)
CRITICAL VALUE ALERT  Critical value received:  Triponin 0.36  Date of notification:  06/27/16  Time of notification:  1500  Critical value read back:yes  Nurse who received alert:  Lenon Curt RN  MD notified (1st page):  Dr Luan Pulling  Time of first page:  1500  MD notified (2nd page):  Time of second page:  Responding MD: Dr Luan Pulling  Time MD responded:  1500

## 2016-06-27 NOTE — Progress Notes (Signed)
Subjective: He was admitted with possible acute stroke although it's difficult to be certain. He has been disoriented delusional and may have been aspirating. He did fail his swallow evaluation at bedside. He will need to have speech therapy again. He says he feels okay. No complaints but it's difficult to be sure about that considering his mental status Objective: Vital signs in last 24 hours: Temp:  [98 F (36.7 C)-99.8 F (37.7 C)] 99.8 F (37.7 C) (01/29 0539) Pulse Rate:  [84-94] 92 (01/29 0539) Resp:  [18-20] 20 (01/29 0539) BP: (101-167)/(58-87) 167/87 (01/29 0539) SpO2:  [97 %-100 %] 99 % (01/29 0539) Weight:  [53 kg (116 lb 12.8 oz)-59 kg (130 lb)] 53 kg (116 lb 12.8 oz) (01/28 2020) Weight change:  Last BM Date: 06/26/16  Intake/Output from previous day: 01/28 0701 - 01/29 0700 In: 1521.7 [I.V.:1021.7; IV Piggyback:500] Out: -   PHYSICAL EXAM General appearance: alert, mild distress and Mildly confused Resp: rhonchi bilaterally Cardio: regular rate and rhythm, S1, S2 normal, no murmur, click, rub or gallop GI: soft, non-tender; bowel sounds normal; no masses,  no organomegaly Extremities: extremities normal, atraumatic, no cyanosis or edema Skin warm and dry. Mucous membranes somewhat dry  Lab Results:  Results for orders placed or performed during the hospital encounter of 06/26/16 (from the past 48 hour(s))  Comprehensive metabolic panel     Status: Abnormal   Collection Time: 06/26/16  4:28 PM  Result Value Ref Range   Sodium 133 (L) 135 - 145 mmol/L   Potassium 4.2 3.5 - 5.1 mmol/L   Chloride 105 101 - 111 mmol/L   CO2 24 22 - 32 mmol/L   Glucose, Bld 126 (H) 65 - 99 mg/dL   BUN 38 (H) 6 - 20 mg/dL   Creatinine, Ser 2.05 (H) 0.61 - 1.24 mg/dL   Calcium 8.7 (L) 8.9 - 10.3 mg/dL   Total Protein 7.1 6.5 - 8.1 g/dL   Albumin 2.4 (L) 3.5 - 5.0 g/dL   AST 54 (H) 15 - 41 U/L   ALT 42 17 - 63 U/L   Alkaline Phosphatase 70 38 - 126 U/L   Total Bilirubin 0.4 0.3 -  1.2 mg/dL   GFR calc non Af Amer 33 (L) >60 mL/min   GFR calc Af Amer 38 (L) >60 mL/min    Comment: (NOTE) The eGFR has been calculated using the CKD EPI equation. This calculation has not been validated in all clinical situations. eGFR's persistently <60 mL/min signify possible Chronic Kidney Disease.    Anion gap 4 (L) 5 - 15  Troponin I     Status: Abnormal   Collection Time: 06/26/16  4:28 PM  Result Value Ref Range   Troponin I 0.38 (HH) <0.03 ng/mL    Comment: CRITICAL RESULT CALLED TO, READ BACK BY AND VERIFIED WITH: BLACKBURN,C. AT 1730 ON 06/26/2016 BY EVA   Lactic acid, plasma     Status: None   Collection Time: 06/26/16  4:28 PM  Result Value Ref Range   Lactic Acid, Venous 1.1 0.5 - 1.9 mmol/L  CBC with Differential     Status: Abnormal   Collection Time: 06/26/16  4:28 PM  Result Value Ref Range   WBC 1.6 (L) 4.0 - 10.5 K/uL    Comment: WHITE COUNT CONFIRMED ON SMEAR   RBC 3.73 (L) 4.22 - 5.81 MIL/uL   Hemoglobin 10.4 (L) 13.0 - 17.0 g/dL   HCT 32.2 (L) 39.0 - 52.0 %   MCV 86.3 78.0 -   100.0 fL   MCH 27.9 26.0 - 34.0 pg   MCHC 32.3 30.0 - 36.0 g/dL   RDW 13.0 11.5 - 15.5 %   Platelets 108 (L) 150 - 400 K/uL    Comment: SPECIMEN CHECKED FOR CLOTS PLATELET COUNT CONFIRMED BY SMEAR    Neutrophils Relative % 47 %   Neutro Abs 0.7 (L) 1.7 - 7.7 K/uL   Lymphocytes Relative 36 %   Lymphs Abs 0.6 (L) 0.7 - 4.0 K/uL   Monocytes Relative 15 %   Monocytes Absolute 0.2 0.1 - 1.0 K/uL   Eosinophils Relative 1 %   Eosinophils Absolute 0.0 0.0 - 0.7 K/uL   Basophils Relative 1 %   Basophils Absolute 0.0 0.0 - 0.1 K/uL  Digoxin level     Status: Abnormal   Collection Time: 06/26/16  4:31 PM  Result Value Ref Range   Digoxin Level 0.7 (L) 0.8 - 2.0 ng/mL  TSH     Status: Abnormal   Collection Time: 06/26/16  4:31 PM  Result Value Ref Range   TSH 0.003 (L) 0.350 - 4.500 uIU/mL    Comment: Performed by a 3rd Generation assay with a functional sensitivity of <=0.01  uIU/mL.  POC occult blood, ED     Status: None   Collection Time: 06/26/16  4:32 PM  Result Value Ref Range   Fecal Occult Bld NEGATIVE NEGATIVE  Lactic acid, plasma     Status: None   Collection Time: 06/26/16  6:18 PM  Result Value Ref Range   Lactic Acid, Venous 0.7 0.5 - 1.9 mmol/L  Troponin I     Status: Abnormal   Collection Time: 06/26/16 11:00 PM  Result Value Ref Range   Troponin I 0.36 (HH) <0.03 ng/mL    Comment: CRITICAL VALUE NOTED.  VALUE IS CONSISTENT WITH PREVIOUSLY REPORTED AND CALLED VALUE.  Troponin I     Status: Abnormal   Collection Time: 06/27/16  6:55 AM  Result Value Ref Range   Troponin I 0.31 (HH) <0.03 ng/mL    Comment: CRITICAL VALUE NOTED.  VALUE IS CONSISTENT WITH PREVIOUSLY REPORTED AND CALLED VALUE.  Lipid panel     Status: Abnormal   Collection Time: 06/27/16  6:55 AM  Result Value Ref Range   Cholesterol 78 0 - 200 mg/dL   Triglycerides 110 <150 mg/dL   HDL 13 (L) >40 mg/dL   Total CHOL/HDL Ratio 6.0 RATIO   VLDL 22 0 - 40 mg/dL   LDL Cholesterol 43 0 - 99 mg/dL    Comment:        Total Cholesterol/HDL:CHD Risk Coronary Heart Disease Risk Table                     Men   Women  1/2 Average Risk   3.4   3.3  Average Risk       5.0   4.4  2 X Average Risk   9.6   7.1  3 X Average Risk  23.4   11.0        Use the calculated Patient Ratio above and the CHD Risk Table to determine the patient's CHD Risk.        ATP III CLASSIFICATION (LDL):  <100     mg/dL   Optimal  100-129  mg/dL   Near or Above                    Optimal  130-159  mg/dL   Borderline    160-189  mg/dL   High  >190     mg/dL   Very High   Glucose, capillary     Status: Abnormal   Collection Time: 06/27/16  8:09 AM  Result Value Ref Range   Glucose-Capillary 56 (L) 65 - 99 mg/dL  Glucose, capillary     Status: Abnormal   Collection Time: 06/27/16  8:52 AM  Result Value Ref Range   Glucose-Capillary 106 (H) 65 - 99 mg/dL    ABGS No results for input(s): PHART,  PO2ART, TCO2, HCO3 in the last 72 hours.  Invalid input(s): PCO2 CULTURES Recent Results (from the past 240 hour(s))  Culture, Urine     Status: Abnormal (Preliminary result)   Collection Time: 06/24/16  9:00 AM  Result Value Ref Range Status   Specimen Description URINE, RANDOM  Final   Special Requests NONE  Final   Culture (A)  Final    60,000 COLONIES/mL ESCHERICHIA COLI SUSCEPTIBILITIES TO FOLLOW Performed at [facility], [address] [place], [place] [address]    Report Status PENDING  Incomplete   Studies/Results: Dg Chest 2 View  Result Date: 06/26/2016 CLINICAL DATA:  Patient with altered mental status. EXAM: CHEST  2 VIEW COMPARISON:  A chest radiograph 06/03/2016. FINDINGS: Single lead AICD device overlies the left hemithorax. Stable cardiomegaly status post median sternotomy. No consolidative pulmonary opacities. No pleural effusion or pneumothorax. Regional skeleton is unremarkable. IMPRESSION: No active cardiopulmonary disease. Electronically Signed   By: Drew  Davis M.D.   On: 06/26/2016 16:18   Ct Head Wo Contrast  Result Date: 06/26/2016 CLINICAL DATA:  65-year-old male with altered mental status. EXAM: CT HEAD WITHOUT CONTRAST TECHNIQUE: Contiguous axial images were obtained from the base of the skull through the vertex without intravenous contrast. COMPARISON:  09/22/2015 and 04/28/2015 head CTs FINDINGS: Brain: A left cerebellar infarct is noted, age indeterminate but new since 09/22/2015. There is no evidence of hemorrhage. Atrophy, severe chronic small-vessel white matter ischemic changes and remote infarcts within the right cerebellum, pons, basal ganglia, thalami and right parietal regions again noted. Vascular: Intracranial atherosclerotic calcifications noted. Skull: Normal. Negative for fracture or focal lesion. Sinuses/Orbits: No acute finding. Other: None. IMPRESSION: Age indeterminate left cerebellar infarct without hemorrhage, new since 09/22/2015.  Atrophy, chronic small-vessel white matter ischemic changes and remote infarcts as described. Electronically Signed   By: Jeffrey  Hu M.D.   On: 06/26/2016 16:26    Medications:  Prior to Admission:  Prescriptions Prior to Admission  Medication Sig Dispense Refill Last Dose  . aspirin 81 MG EC tablet Take 1 tablet (81 mg total) by mouth daily.   06/26/2016 at Unknown time  . atorvastatin (LIPITOR) 80 MG tablet Take 80 mg by mouth daily.    06/26/2016 at Unknown time  . carvedilol (COREG) 25 MG tablet Take 12.5 mg by mouth 2 (two) times daily with a meal.    06/26/2016 at 800  . Cholecalciferol (VITAMIN D3) 2000 UNITS TABS Take 2,000 mg by mouth daily.   06/26/2016 at Unknown time  . digoxin (LANOXIN) 0.125 MG tablet TAKE 1/2 TABLET BY MOUTH DAILY 15 tablet 3 06/26/2016 at Unknown time  . ferrous sulfate 325 (65 FE) MG tablet Take 325 mg by mouth daily with breakfast.   06/26/2016 at Unknown time  . furosemide (LASIX) 40 MG tablet Take 40 mg by mouth daily.   06/03/2016 at 800a  . isosorbide dinitrate (ISORDIL) 20 MG tablet Take 40 mg by mouth 3 (three) times daily.      06/26/2016 at Unknown time  . losartan (COZAAR) 25 MG tablet Take 25 mg by mouth daily.   06/26/2016 at Unknown time  . mirabegron ER (MYRBETRIQ) 25 MG TB24 tablet Take 25 mg by mouth daily.   06/26/2016 at 800  . mupirocin ointment (BACTROBAN) 2 % Place 1 application into the nose 2 (two) times daily.   06/26/2016 at Unknown time  . NOVOLOG FLEXPEN 100 UNIT/ML FlexPen Inject 1-28 Units into the skin daily. Up to 28 units daily  By sliding scale   unknown  . pantoprazole (PROTONIX) 40 MG tablet TAKE ONE TABLET   BY MOUTH   DAILY 30 tablet 11 06/25/2016 at Unknown time  . urea (CARMOL) 40 % CREA Apply 1 application topically 2 (two) times daily.  5   . mirtazapine (REMERON) 7.5 MG tablet Take 1 tablet by mouth daily.     . [DISCONTINUED] levofloxacin (LEVAQUIN) 500 MG tablet Take 1 tablet (500 mg total) by mouth daily. (Patient not taking:  Reported on 06/26/2016) 7 tablet 0 Completed Course at Unknown time   Scheduled: .  stroke: mapping our early stages of recovery book   Does not apply Once  . aspirin  300 mg Rectal Daily   Or  . aspirin  325 mg Oral Daily  . atorvastatin  80 mg Oral Daily  . dextrose  25 mL Intravenous Once  . dextrose      . digoxin  62.5 mcg Oral Daily  . enoxaparin (LOVENOX) injection  40 mg Subcutaneous Q24H  . insulin aspart  0-15 Units Subcutaneous TID WC  . isosorbide dinitrate  40 mg Oral TID  . mirabegron ER  25 mg Oral Daily   Continuous: . sodium chloride 500 mL (06/26/16 1647)   VFI:EPPIRJJOACZYS **OR** acetaminophen (TYLENOL) oral liquid 160 mg/5 mL **OR** acetaminophen  Assesment: he was admitted with altered mental status. CT which I have personally reviewed shows a stroke that is new since April of last year. He can do MRI because he has a pacer defibrillator. I was informed by nursing staff that he's had 2 runs of V. tach which are asymptomatic. Chest x-ray which I personally reviewed does not show an acute infiltrate but there is concern about aspiration Principal Problem:   Altered mental status Active Problems:   CKD (chronic kidney disease)   Diabetes mellitus type 2 in nonobese (HCC)   Anemia in chronic kidney disease   ICD (implantable cardioverter-defibrillator) in place   Elevated troponin   Pancytopenia (HCC)   CVA (cerebral vascular accident) ([place])   Severe malnutrition ([place])    Plan: PT speech evaluation. Neurology consultation. Nothing by mouth for now.    LOS: 1 day   Olliver Boyadjian L 06/27/2016, 9:17 AM

## 2016-06-28 ENCOUNTER — Inpatient Hospital Stay (HOSPITAL_COMMUNITY)
Admit: 2016-06-28 | Discharge: 2016-06-28 | Disposition: A | Discharge status: Home / Self Care | Payer: Medicare HMO | Attending: Neurology | Admitting: Neurology

## 2016-06-28 DIAGNOSIS — E440 Moderate protein-calorie malnutrition: Secondary | ICD-10-CM | POA: Insufficient documentation

## 2016-06-28 LAB — GLUCOSE, CAPILLARY
GLUCOSE-CAPILLARY: 61 mg/dL — AB (ref 65–99)
GLUCOSE-CAPILLARY: 87 mg/dL (ref 65–99)
GLUCOSE-CAPILLARY: 116 mg/dL — AB (ref 65–99)
Glucose-Capillary: 112 mg/dL — ABNORMAL HIGH (ref 65–99)
Glucose-Capillary: 117 mg/dL — ABNORMAL HIGH (ref 65–99)

## 2016-06-28 LAB — HEMOGLOBIN A1C
HEMOGLOBIN A1C: 5.6 % (ref 4.8–5.6)
MEAN PLASMA GLUCOSE: 114 mg/dL

## 2016-06-28 LAB — HOMOCYSTEINE: Homocysteine: 16.5 umol/L — ABNORMAL HIGH (ref 0.0–15.0)

## 2016-06-28 NOTE — Clinical Social Work Note (Signed)
[facility] started insurance authorization yesterday. Will continue to follow.  Benay Pike, [place]

## 2016-06-28 NOTE — Progress Notes (Signed)
EEG Completed; Results Pending  

## 2016-06-28 NOTE — Evaluation (Signed)
Occupational Therapy Evaluation Patient Details Name: Keith Young MRN: [id number] DOB: 1951-01-20 Today's Date: 06/28/2016    History of Present Illness 66 y.o. male with hx of systolic CHF EF 37% s/p ICD, CKD3, HLD, NSVT, prior CVA, DM2, HTN, brought to the ER from a group home as he has been intermittently more confused these past few days.  He also complained of sputum productive coughs, but no chest pain, fever, or chills.  Evaluation in the ER showed CXR with PNA.  Head CT shows a stroke that is new since April of last year. He can do MRI because he has a pacer defibrillator   Clinical Impression   Pt awake, alert, oriented to person only today. Pt with limited conversing today, able to answer simple yes/no questions, unable to communicate need for toileting. Pt requiring constant supervision while seated at EOB due to posterior and left lean, able to correct with verbal cuing. Pt requiring max assist for ADL tasks due to cognition and limited ability to sequence tasks. Per chart review pt is requiring significantly increased need for assistance compared to baseline, recommend SNF on discharge due to increased level of assistance required for ADL performance and decreased safety during task completion.     Follow Up Recommendations  SNF    Equipment Recommendations  None recommended by OT       Precautions / Restrictions Precautions Precautions: Fall Restrictions Weight Bearing Restrictions: No      Mobility Bed Mobility Overal bed mobility: Needs Assistance Bed Mobility: Supine to Sit     Supine to sit: Min assist     General bed mobility comments: assistance for motor planning with supine<>sit.   Transfers Overall transfer level: Needs assistance Equipment used: Rolling walker (2 wheeled) Transfers: Sit to/from Stand Sit to Stand: Min assist                   ADL Overall ADL's : Needs assistance/impaired                 Upper Body Dressing :  Maximal assistance;Sitting Upper Body Dressing Details (indicate cue type and reason): Pt requires max assist and constant verbal cuing for sequencing task         Toileting- Clothing Manipulation and Hygiene: Total assistance;Sit to/from stand Toileting - Clothing Manipulation Details (indicate cue type and reason): Pt with incontinent episode during evaluation. Pt required min assist from OT to maintain standing while nurse tech providing toileting hygiene     Functional mobility during ADLs: Minimal assistance;Rolling walker                 Pertinent Vitals/Pain Pain Assessment: No/denies pain     Hand Dominance Right   Extremity/Trunk Assessment Upper Extremity Assessment Upper Extremity Assessment: Generalized weakness   Lower Extremity Assessment Lower Extremity Assessment: Defer to PT evaluation       Communication     Cognition Arousal/Alertness: Awake/alert Behavior During Therapy: Flat affect Overall Cognitive Status: Impaired/Different from baseline Area of Impairment: Orientation Orientation Level: Disoriented to;Time;Situation;Place                            Home Living   Living Arrangements: Group Home (family care ) Available Help at Discharge: Available 24 hours/day (sister, brother in law, additional person) Type of Home: House Home Access: Level entry     Home Layout: One level     Bathroom Shower/Tub: Walk-in shower  Bathroom Toilet: Standard     Home Equipment: Bedside commode;Walker - 2 wheels;Cane - single point;Shower seat;Wheelchair - manual          Prior Functioning/Environment    Gait / Transfers Assistance Needed: Pt states he was using a RW for ambulation.  Sister states that for the last week he has required assistance for ambulation. ADL's / Homemaking Assistance Needed: assistance with dressing and bathing.             OT Problem List: Decreased strength;Decreased activity tolerance;Impaired balance  (sitting and/or standing);Decreased coordination;Decreased safety awareness;Decreased cognition;Decreased knowledge of use of DME or AE    End of Session Equipment Utilized During Treatment: Gait belt;Rolling walker  Activity Tolerance: Patient tolerated treatment well Patient left: in bed;with call bell/phone within reach   Time: 0915-0937 OT Time Calculation (min): 22 min Charges:  OT General Charges $OT Visit: 1 Procedure OT Evaluation $OT Eval Low Complexity: 1 Procedure Guadelupe Sabin, OTR/L  [phone] 06/28/2016, 12:28 PM

## 2016-06-28 NOTE — Progress Notes (Signed)
Subjective: He says he feels okay. No new complaints. He is still coughing some. Neurology consultation noted and appreciated. No nausea or vomiting.  Objective: Vital signs in last 24 hours: Temp:  [98.9 F (37.2 C)-102.4 F (39.1 C)] 99.5 F (37.5 C) (01/30 0600) Pulse Rate:  [85-105] 102 (01/30 0600) Resp:  [17-22] 17 (01/30 0600) BP: (128-170)/(71-88) 158/81 (01/30 0201) SpO2:  [98 %-100 %] 100 % (01/30 0600) Weight change:  Last BM Date: 06/26/16  Intake/Output from previous day: No intake/output data recorded.  PHYSICAL EXAM General appearance: alert, cooperative and mild distress Resp: rhonchi bilaterally Cardio: Regular with systolic heart murmur that is unchanged from baseline GI: soft, non-tender; bowel sounds normal; no masses,  no organomegaly Extremities: extremities normal, atraumatic, no cyanosis or edema Skin warm and dry. Mucous membranes are moist  Lab Results:  Results for orders placed or performed during the hospital encounter of 06/26/16 (from the past 48 hour(s))  Comprehensive metabolic panel     Status: Abnormal   Collection Time: 06/26/16  4:28 PM  Result Value Ref Range   Sodium 133 (L) 135 - 145 mmol/L   Potassium 4.2 3.5 - 5.1 mmol/L   Chloride 105 101 - 111 mmol/L   CO2 24 22 - 32 mmol/L   Glucose, Bld 126 (H) 65 - 99 mg/dL   BUN 38 (H) 6 - 20 mg/dL   Creatinine, Ser 2.05 (H) 0.61 - 1.24 mg/dL   Calcium 8.7 (L) 8.9 - 10.3 mg/dL   Total Protein 7.1 6.5 - 8.1 g/dL   Albumin 2.4 (L) 3.5 - 5.0 g/dL   AST 54 (H) 15 - 41 U/L   ALT 42 17 - 63 U/L   Alkaline Phosphatase 70 38 - 126 U/L   Total Bilirubin 0.4 0.3 - 1.2 mg/dL   GFR calc non Af Amer 33 (L) >60 mL/min   GFR calc Af Amer 38 (L) >60 mL/min    Comment: (NOTE) The eGFR has been calculated using the CKD EPI equation. This calculation has not been validated in all clinical situations. eGFR's persistently <60 mL/min signify possible Chronic Kidney Disease.    Anion gap 4 (L) 5 - 15   Troponin I     Status: Abnormal   Collection Time: 06/26/16  4:28 PM  Result Value Ref Range   Troponin I 0.38 (HH) <0.03 ng/mL    Comment: CRITICAL RESULT CALLED TO, READ BACK BY AND VERIFIED WITH: BLACKBURN,C. AT 1730 ON 06/26/2016 BY EVA   Lactic acid, plasma     Status: None   Collection Time: 06/26/16  4:28 PM  Result Value Ref Range   Lactic Acid, Venous 1.1 0.5 - 1.9 mmol/L  CBC with Differential     Status: Abnormal   Collection Time: 06/26/16  4:28 PM  Result Value Ref Range   WBC 1.6 (L) 4.0 - 10.5 K/uL    Comment: WHITE COUNT CONFIRMED ON SMEAR   RBC 3.73 (L) 4.22 - 5.81 MIL/uL   Hemoglobin 10.4 (L) 13.0 - 17.0 g/dL   HCT 32.2 (L) 39.0 - 52.0 %   MCV 86.3 78.0 - 100.0 fL   MCH 27.9 26.0 - 34.0 pg   MCHC 32.3 30.0 - 36.0 g/dL   RDW 13.0 11.5 - 15.5 %   Platelets 108 (L) 150 - 400 K/uL    Comment: SPECIMEN CHECKED FOR CLOTS PLATELET COUNT CONFIRMED BY SMEAR    Neutrophils Relative % 47 %   Neutro Abs 0.7 (L) 1.7 - 7.7 K/uL  Lymphocytes Relative 36 %   Lymphs Abs 0.6 (L) 0.7 - 4.0 K/uL   Monocytes Relative 15 %   Monocytes Absolute 0.2 0.1 - 1.0 K/uL   Eosinophils Relative 1 %   Eosinophils Absolute 0.0 0.0 - 0.7 K/uL   Basophils Relative 1 %   Basophils Absolute 0.0 0.0 - 0.1 K/uL  Digoxin level     Status: Abnormal   Collection Time: 06/26/16  4:31 PM  Result Value Ref Range   Digoxin Level 0.7 (L) 0.8 - 2.0 ng/mL  TSH     Status: Abnormal   Collection Time: 06/26/16  4:31 PM  Result Value Ref Range   TSH 0.003 (L) 0.350 - 4.500 uIU/mL    Comment: Performed by a 3rd Generation assay with a functional sensitivity of <=0.01 uIU/mL.  POC occult blood, ED     Status: None   Collection Time: 06/26/16  4:32 PM  Result Value Ref Range   Fecal Occult Bld NEGATIVE NEGATIVE  Lactic acid, plasma     Status: None   Collection Time: 06/26/16  6:18 PM  Result Value Ref Range   Lactic Acid, Venous 0.7 0.5 - 1.9 mmol/L  Troponin I     Status: Abnormal   Collection  Time: 06/26/16 11:00 PM  Result Value Ref Range   Troponin I 0.36 (HH) <0.03 ng/mL    Comment: CRITICAL VALUE NOTED.  VALUE IS CONSISTENT WITH PREVIOUSLY REPORTED AND CALLED VALUE.  Troponin I     Status: Abnormal   Collection Time: 06/27/16  6:55 AM  Result Value Ref Range   Troponin I 0.31 (HH) <0.03 ng/mL    Comment: CRITICAL VALUE NOTED.  VALUE IS CONSISTENT WITH PREVIOUSLY REPORTED AND CALLED VALUE.  Hemoglobin A1c     Status: None   Collection Time: 06/27/16  6:55 AM  Result Value Ref Range   Hgb A1c MFr Bld 5.6 4.8 - 5.6 %    Comment: (NOTE)         Pre-diabetes: 5.7 - 6.4         Diabetes: >6.4         Glycemic control for adults with diabetes: <7.0    Mean Plasma Glucose 114 mg/dL    Comment: (NOTE) Performed At: [facility] [place], [place] [id number] Lindon Romp MD [id number]   Lipid panel     Status: Abnormal   Collection Time: 06/27/16  6:55 AM  Result Value Ref Range   Cholesterol 78 0 - 200 mg/dL   Triglycerides 110 <150 mg/dL   HDL 13 (L) >40 mg/dL   Total CHOL/HDL Ratio 6.0 RATIO   VLDL 22 0 - 40 mg/dL   LDL Cholesterol 43 0 - 99 mg/dL    Comment:        Total Cholesterol/HDL:CHD Risk Coronary Heart Disease Risk Table                     Men   Women  1/2 Average Risk   3.4   3.3  Average Risk       5.0   4.4  2 X Average Risk   9.6   7.1  3 X Average Risk  23.4   11.0        Use the calculated Patient Ratio above and the CHD Risk Table to determine the patient's CHD Risk.        ATP III CLASSIFICATION (LDL):  <100     mg/dL   Optimal  100-129  mg/dL   Near or Above                    Optimal  130-159  mg/dL   Borderline  160-189  mg/dL   High  >190     mg/dL   Very High   Glucose, capillary     Status: Abnormal   Collection Time: 06/27/16  8:09 AM  Result Value Ref Range   Glucose-Capillary 56 (L) 65 - 99 mg/dL  Glucose, capillary     Status: Abnormal   Collection Time: 06/27/16  8:52 AM  Result Value Ref  Range   Glucose-Capillary 106 (H) 65 - 99 mg/dL  Vitamin B12     Status: None   Collection Time: 06/27/16 10:41 AM  Result Value Ref Range   Vitamin B-12 421 180 - 914 pg/mL    Comment: (NOTE) This assay is not validated for testing neonatal or myeloproliferative syndrome specimens for Vitamin B12 levels. Performed at [facility], [place] [address], [place], [place] [address]   Glucose, capillary     Status: Abnormal   Collection Time: 06/27/16 11:44 AM  Result Value Ref Range   Glucose-Capillary 61 (L) 65 - 99 mg/dL  Glucose, capillary     Status: None   Collection Time: 06/27/16  1:16 PM  Result Value Ref Range   Glucose-Capillary 84 65 - 99 mg/dL  Troponin I     Status: Abnormal   Collection Time: 06/27/16  1:25 PM  Result Value Ref Range   Troponin I 0.36 (HH) <0.03 ng/mL    Comment: CRITICAL RESULT CALLED TO, READ BACK BY AND VERIFIED WITH: MORRIS,L AT 1440 ON 06/27/2016 BY ISLEY,B   Glucose, capillary     Status: Abnormal   Collection Time: 06/27/16  4:44 PM  Result Value Ref Range   Glucose-Capillary 109 (H) 65 - 99 mg/dL   Comment 1 Notify RN    Comment 2 Document in Chart   Glucose, capillary     Status: None   Collection Time: 06/27/16 10:10 PM  Result Value Ref Range   Glucose-Capillary 93 65 - 99 mg/dL   Comment 1 Notify RN    Comment 2 Document in Chart   Glucose, capillary     Status: Abnormal   Collection Time: 06/28/16  7:34 AM  Result Value Ref Range   Glucose-Capillary 61 (L) 65 - 99 mg/dL   Comment 1 Notify RN   Glucose, capillary     Status: None   Collection Time: 06/28/16  8:33 AM  Result Value Ref Range   Glucose-Capillary 87 65 - 99 mg/dL    ABGS No results for input(s): PHART, PO2ART, TCO2, HCO3 in the last 72 hours.  Invalid input(s): PCO2 CULTURES Recent Results (from the past 240 hour(s))  Culture, Urine     Status: Abnormal   Collection Time: 06/24/16  9:00 AM  Result Value Ref Range Status   Specimen Description URINE, RANDOM   Final   Special Requests NONE  Final   Culture 60,000 COLONIES/mL ESCHERICHIA COLI (A)  Final   Report Status 06/27/2016 FINAL  Final   Organism ID, Bacteria ESCHERICHIA COLI (A)  Final      Susceptibility   Escherichia coli - MIC*    AMPICILLIN >=32 RESISTANT Resistant     CEFAZOLIN <=4 SENSITIVE Sensitive     CEFTRIAXONE <=1 SENSITIVE Sensitive     CIPROFLOXACIN <=0.25 SENSITIVE Sensitive     GENTAMICIN <=1 SENSITIVE Sensitive  IMIPENEM <=0.25 SENSITIVE Sensitive     NITROFURANTOIN <=16 SENSITIVE Sensitive     TRIMETH/SULFA <=20 SENSITIVE Sensitive     AMPICILLIN/SULBACTAM 16 INTERMEDIATE Intermediate     PIP/TAZO <=4 SENSITIVE Sensitive     Extended ESBL NEGATIVE Sensitive     * 60,000 COLONIES/mL ESCHERICHIA COLI   Studies/Results: Dg Chest 2 View  Result Date: 06/26/2016 CLINICAL DATA:  Patient with altered mental status. EXAM: CHEST  2 VIEW COMPARISON:  A chest radiograph 06/03/2016. FINDINGS: Single lead AICD device overlies the left hemithorax. Stable cardiomegaly status post median sternotomy. No consolidative pulmonary opacities. No pleural effusion or pneumothorax. Regional skeleton is unremarkable. IMPRESSION: No active cardiopulmonary disease. Electronically Signed   By: Lovey Newcomer M.D.   On: 06/26/2016 16:18   Ct Head Wo Contrast  Result Date: 06/26/2016 CLINICAL DATA:  66 year old male with altered mental status. EXAM: CT HEAD WITHOUT CONTRAST TECHNIQUE: Contiguous axial images were obtained from the base of the skull through the vertex without intravenous contrast. COMPARISON:  09/22/2015 and 04/28/2015 head CTs FINDINGS: Brain: A left cerebellar infarct is noted, age indeterminate but new since 09/22/2015. There is no evidence of hemorrhage. Atrophy, severe chronic small-vessel white matter ischemic changes and remote infarcts within the right cerebellum, pons, basal ganglia, thalami and right parietal regions again noted. Vascular: Intracranial atherosclerotic  calcifications noted. Skull: Normal. Negative for fracture or focal lesion. Sinuses/Orbits: No acute finding. Other: None. IMPRESSION: Age indeterminate left cerebellar infarct without hemorrhage, new since 09/22/2015. Atrophy, chronic small-vessel white matter ischemic changes and remote infarcts as described. Electronically Signed   By: Margarette Canada M.D.   On: 06/26/2016 16:26   US Carotid Bilateral (at [facility] Only)  Result Date: 06/27/2016 CLINICAL DATA:  TIA. EXAM: BILATERAL CAROTID DUPLEX ULTRASOUND TECHNIQUE: Pearline Cables scale imaging, color Doppler and duplex ultrasound were performed of bilateral carotid and vertebral arteries in the neck. COMPARISON:  CT 06/26/2016.  Ultrasound 12/25/2013. FINDINGS: Criteria: Quantification of carotid stenosis is based on velocity parameters that correlate the residual internal carotid diameter with NASCET-based stenosis levels, using the diameter of the distal internal carotid lumen as the denominator for stenosis measurement. The following velocity measurements were obtained: RIGHT ICA:  56/14 cm/sec CCA:  56/21 cm/sec SYSTOLIC ICA/CCA RATIO:  0.9 DIASTOLIC ICA/CCA RATIO:  1.1 ECA:  69 cm/sec LEFT ICA:  52/15 cm/sec CCA:  30/86 cm/sec SYSTOLIC ICA/CCA RATIO:  0.6 DIASTOLIC ICA/CCA RATIO:  1.3 ECA:  73 cm/sec RIGHT CAROTID ARTERY: Right carotid bifurcation and proximal ICA atherosclerotic vascular disease. No flow limiting stenosis. Degree of stenosis less than 50%. Similar finding noted on prior exam. Scratched RIGHT VERTEBRAL ARTERY:  Patent with antegrade flow. LEFT CAROTID ARTERY: Left carotid bifurcation and proximal ICA atherosclerotic vascular disease. No flow limiting stenosis. Degree of stenosis less 50%. Similar findings noted on prior exam. LEFT VERTEBRAL ARTERY:  Patent with antegrade flow. IMPRESSION: One Mild bilateral carotid bifurcation and proximal ICA atherosclerotic vascular disease. Degree of stenosis less than 50% bilaterally. Similar findings noted  on prior exam. 2. Vertebral arteries are patent with antegrade flow . Electronically Signed   By: Marcello Moores  Register   On: 06/27/2016 11:09    Medications:  Prior to Admission:  Prescriptions Prior to Admission  Medication Sig Dispense Refill Last Dose  . aspirin 81 MG EC tablet Take 1 tablet (81 mg total) by mouth daily.   06/26/2016 at Unknown time  . atorvastatin (LIPITOR) 80 MG tablet Take 80 mg by mouth daily.    06/26/2016 at  Unknown time  . carvedilol (COREG) 25 MG tablet Take 12.5 mg by mouth 2 (two) times daily with a meal.    06/26/2016 at 800  . Cholecalciferol (VITAMIN D3) 2000 UNITS TABS Take 2,000 mg by mouth daily.   06/26/2016 at Unknown time  . digoxin (LANOXIN) 0.125 MG tablet TAKE 1/2 TABLET BY MOUTH DAILY 15 tablet 3 06/26/2016 at Unknown time  . ferrous sulfate 325 (65 FE) MG tablet Take 325 mg by mouth daily with breakfast.   06/26/2016 at Unknown time  . furosemide (LASIX) 40 MG tablet Take 40 mg by mouth daily.   06/03/2016 at [place]  . isosorbide dinitrate (ISORDIL) 20 MG tablet Take 40 mg by mouth 3 (three) times daily.    06/26/2016 at Unknown time  . losartan (COZAAR) 25 MG tablet Take 25 mg by mouth daily.   06/26/2016 at Unknown time  . mirabegron ER (MYRBETRIQ) 25 MG TB24 tablet Take 25 mg by mouth daily.   06/26/2016 at 800  . mupirocin ointment (BACTROBAN) 2 % Place 1 application into the nose 2 (two) times daily.   06/26/2016 at Unknown time  . NOVOLOG FLEXPEN 100 UNIT/ML FlexPen Inject 1-28 Units into the skin daily. Up to 28 units daily  By sliding scale   unknown  . pantoprazole (PROTONIX) 40 MG tablet TAKE ONE TABLET   BY MOUTH   DAILY 30 tablet 11 06/25/2016 at Unknown time  . urea (CARMOL) 40 % CREA Apply 1 application topically 2 (two) times daily.  5   . mirtazapine (REMERON) 7.5 MG tablet Take 1 tablet by mouth daily.     . [DISCONTINUED] levofloxacin (LEVAQUIN) 500 MG tablet Take 1 tablet (500 mg total) by mouth daily. (Patient not taking: Reported on 06/26/2016) 7  tablet 0 Completed Course at Unknown time   Scheduled: . aspirin  300 mg Rectal Daily   Or  . aspirin  325 mg Oral Daily  . atorvastatin  80 mg Oral Daily  . digoxin  62.5 mcg Oral Daily  . enoxaparin (LOVENOX) injection  40 mg Subcutaneous Q24H  . feeding supplement (ENSURE ENLIVE)  237 mL Oral BID BM  . insulin aspart  0-15 Units Subcutaneous TID WC  . isosorbide dinitrate  40 mg Oral TID  . mirabegron ER  25 mg Oral Daily  . multivitamin with minerals  1 tablet Oral Daily   Continuous: . sodium chloride 100 mL/hr at 06/28/16 0400   [id number] **OR** acetaminophen (TYLENOL) oral liquid 160 mg/5 mL **OR** acetaminophen  Assesment: He was admitted with altered mental status. He's had previous CVA. He has malnutrition he is on feeding supplements. He has had neurology consultation and there is concern that he may have had another stroke and/or seizure disorder. At baseline he has heart failure with a ICD in place. It has been recommended that he go to skilled care facility and I think that's appropriate Principal Problem:   Altered mental status Active Problems:   CKD (chronic kidney disease)   Diabetes mellitus type 2 in nonobese (HCC)   Anemia in chronic kidney disease   ICD (implantable cardioverter-defibrillator) in place   Elevated troponin   Pancytopenia (HCC)   CVA (cerebral vascular accident) ([place])   Severe malnutrition ([place])   Malnutrition of moderate degree    Plan: Continue treatments. I don't think he is ready for transfer to skilled care facility yet but there is potential that he might be ready tomorrow.    LOS: 2 days   Meloni Hinz  L 06/28/2016, 9:16 AM

## 2016-06-29 LAB — GLUCOSE, CAPILLARY
GLUCOSE-CAPILLARY: 109 mg/dL — AB (ref 65–99)
GLUCOSE-CAPILLARY: 67 mg/dL (ref 65–99)
GLUCOSE-CAPILLARY: 85 mg/dL (ref 65–99)
GLUCOSE-CAPILLARY: 88 mg/dL (ref 65–99)
Glucose-Capillary: 110 mg/dL — ABNORMAL HIGH (ref 65–99)

## 2016-06-29 NOTE — Procedures (Signed)
Keith A. Merlene Laughter, MD     [email or web address]           HISTORY: The patient presents with altered mental status that are suspicious for possible seizures. He is 66 years old.  MEDICATIONS: Scheduled Meds: . aspirin  300 mg Rectal Daily   Or  . aspirin  325 mg Oral Daily  . atorvastatin  80 mg Oral Daily  . digoxin  62.5 mcg Oral Daily  . enoxaparin (LOVENOX) injection  40 mg Subcutaneous Q24H  . feeding supplement (ENSURE ENLIVE)  237 mL Oral BID BM  . insulin aspart  0-15 Units Subcutaneous TID WC  . isosorbide dinitrate  40 mg Oral TID  . mirabegron ER  25 mg Oral Daily  . multivitamin with minerals  1 tablet Oral Daily   Continuous Infusions: . sodium chloride 100 mL/hr at 06/29/16 0021   PRN Meds:.acetaminophen **OR** acetaminophen (TYLENOL) oral liquid 160 mg/5 mL **OR** acetaminophen  Prior to Admission medications   Medication Sig Start Date End Date Taking? Authorizing Provider  aspirin 81 MG EC tablet Take 1 tablet (81 mg total) by mouth daily. 12/17/13  Yes Larey Dresser, MD  atorvastatin (LIPITOR) 80 MG tablet Take 80 mg by mouth daily.    Yes Historical Provider, MD  carvedilol (COREG) 25 MG tablet Take 12.5 mg by mouth 2 (two) times daily with a meal.    Yes Historical Provider, MD  Cholecalciferol (VITAMIN D3) 2000 UNITS TABS Take 2,000 mg by mouth daily.   Yes Historical Provider, MD  digoxin (LANOXIN) 0.125 MG tablet TAKE 1/2 TABLET BY MOUTH DAILY 03/15/16  Yes Jolaine Artist, MD  ferrous sulfate 325 (65 FE) MG tablet Take 325 mg by mouth daily with breakfast.   Yes Historical Provider, MD  furosemide (LASIX) 40 MG tablet Take 40 mg by mouth daily.   Yes Historical Provider, MD  isosorbide dinitrate (ISORDIL) 20 MG tablet Take 40 mg by mouth 3 (three) times daily.  09/29/15  Yes Historical Provider, MD  losartan (COZAAR) 25 MG tablet Take 25 mg by mouth daily.   Yes Historical Provider, MD  mirabegron ER (MYRBETRIQ) 25 MG TB24  tablet Take 25 mg by mouth daily.   Yes Historical Provider, MD  mupirocin ointment (BACTROBAN) 2 % Place 1 application into the nose 2 (two) times daily.   Yes Historical Provider, MD  NOVOLOG FLEXPEN 100 UNIT/ML FlexPen Inject 1-28 Units into the skin daily. Up to 28 units daily  By sliding scale 09/02/15  Yes Historical Provider, MD  pantoprazole (PROTONIX) 40 MG tablet TAKE ONE TABLET   BY MOUTH   DAILY 05/25/16  Yes Mahala Menghini, PA-C  urea (CARMOL) 40 % CREA Apply 1 application topically 2 (two) times daily. 05/27/16  Yes Historical Provider, MD  mirtazapine (REMERON) 7.5 MG tablet Take 1 tablet by mouth daily. 06/09/16   Historical Provider, MD      ANALYSIS: A 16 channel recording using standard 10 20 measurements is conducted for 21 minutes. The background activity gases high as 7 Hz bilaterally. There is beta activity observing the frontal areas. Awake and drowsy activities are observed. Photic stimulation and hypoventilation are not conducted. There is no focal or lateral slowing. There is no epileptiform activities observed.   IMPRESSION: This recording shows mild global slowing indicating a mild global encephalopathy. However, there is no epileptiform activities observed.      Cathrine Krizan A. Merlene Young, M.D.  Diplomate, Tax adviser of Psychiatry and Neurology ( Neurology).

## 2016-06-29 NOTE — Progress Notes (Signed)
Hypoglycemic Event  CBG: 67  Treatment: 15 GM carbohydrate snack  Symptoms: Sweaty  Follow-up CBG: [id number] CBG Result:88  Possible Reasons for Event: Inadequate meal intake  Comments/MD notified:Dr Luan Pulling made aware    Keith Young

## 2016-06-29 NOTE — Progress Notes (Addendum)
Keith A. Merlene Laughter, MD     [email or web address]          Keith Young is an 66 y.o. male.   ASSESSMENT/PLAN: Acute encephalopathy of unclear etiology but likely due to multifactorial toxic metabolic disturbances particularly acute renal failure and hyponatremia.    Hyperthyroidism. Formal endocrinology consultation is recommended.  Multiple small and large vessel strokes. This increases the risk of vascular dementia.  Recommendations: Increase aspirin to 325mg  qday 30 day they monitor to evaluate for atrial fibrillation given his multiple strokes. FU 6 weeks in office.         GENERAL: Thin  man who is in no acute distress.  HEENT: This is normal.  ABDOMEN: soft  EXTREMITIES: No edema   BACK: This is normal.  SKIN: Normal by inspection.    MENTAL STATUS: He is awake and alert. He knows that he's in the hospital. There is no dysarthria. He cannot give a good history as to why he is in the hospital.  CRANIAL NERVES: Pupils are equal, round and reactive to light and accomodation; extra ocular movements are full, there is no significant nystagmus; visual fields are full; upper and lower facial muscles are normal in strength and symmetric - except for flattening of the nasolabial fold on the L; tongue is midline; uvula is midline; shoulder elevation is normal.  MOTOR: Normal tone, bulk and strength - except for proximal weakness greatest 4/5 in arms and legs; no pronator drift.  COORDINATION: Left finger to nose is normal, right finger to nose is normal, No rest tremor; no intention tremor; no postural tremor; no bradykinesia.     EEG: This recording shows mild global slowing indicating a mild global encephalopathy. However, there is no epileptiform activities observed.   Blood pressure 138/72, pulse 94, temperature 98.9 F (37.2 C), temperature source Oral, resp. rate 18, height 5\' 3"  (1.6 m), weight 116 lb 12.8 oz (53 kg), SpO2 100  %.  Past Medical History:  Diagnosis Date  . AICD (automatic cardioverter/defibrillator) present   . Alcoholism in remission ([place])   . Anemia   . Aortic insufficiency   . Arthritis   . At moderate risk for fall   . Bell's palsy   . Cardiomyopathy ([place])   . Chronic systolic heart failure (HCC)    NYHA Class III  . CKD (chronic kidney disease) stage 3, GFR 30-59 ml/min   . COPD (chronic obstructive pulmonary disease) ([place])   . Essential hypertension, benign   . Hyperlipidemia   . Noncompliance with medication regimen   . Nonischemic cardiomyopathy (HCC)    EF 15-20%  . NSVT (nonsustained ventricular tachycardia) ([place])   . Numbness of right jaw    Had a stoke in 01/2013. Numbness is occasional, especially when trying to chew.  . Productive cough 08/2013   With brown sputum   . SOB (shortness of breath) on exertion 08/2013  . Stroke ([place]) 01/2013   weakness of right side from CVA  . Type 2 diabetes mellitus ([place])   . Uses hearing aid 2014   recently received new hearing aids    Past Surgical History:  Procedure Laterality Date  . BIOPSY N/A 04/10/2014   Procedure: GASTRIC BIOPSY;  Surgeon: Daneil Dolin, MD;  Location: [facility];  Service: Endoscopy;  Laterality: N/A;  . BIOPSY  10/12/2015   Procedure: BIOPSY;  Surgeon: Daneil Dolin, MD;  Location: [facility] SU[facility];  Service: Endoscopy;;  stomach bx's  . CARDIAC DEFIBRILLATOR PLACEMENT  11/12/12   [employer] Inogen MINI ICD implanted in [place] at [facility] per Dr Nelly Laurence note  . COLONOSCOPY WITH PROPOFOL N/A 04/10/2014   RMR: Colonic Diverticulosis  . ESOPHAGOGASTRODUODENOSCOPY (EGD) WITH PROPOFOL N/A 04/10/2014   RMR: Mild erosive reflux esophagitis. Multiple antral polyps likely hyperplastic status post removal by hot snare cautery technique. Diffusely abnormal stomach status post gastric biopsy I suspect some of patients anemaia may be due to intermittent oozing from  the stomach. It would be difficult and a risky proposition to attempt complete removal of all of his gastric polyps.  . ESOPHAGOGASTRODUODENOSCOPY (EGD) WITH PROPOFOL N/A 10/12/2015   Procedure: ESOPHAGOGASTRODUODENOSCOPY (EGD) WITH PROPOFOL;  Surgeon: Daneil Dolin, MD;  Location: [facility];  Service: Endoscopy;  Laterality: N/A;  4287 - moved to 9:15  . POLYPECTOMY N/A 04/10/2014   Procedure: GASTRIC POLYPECTOMY;  Surgeon: Daneil Dolin, MD;  Location: [facility];  Service: Endoscopy;  Laterality: N/A;  . RIGHT HEART CATHETERIZATION N/A 02/06/2014   Procedure: RIGHT HEART CATH;  Surgeon: Larey Dresser, MD;  Location: [facility] CATH LAB;  Service: Cardiovascular;  Laterality: N/A;    Family History  Problem Relation Age of Onset  . Kidney disease Mother   . Kidney disease Sister   . Colon cancer Neg Hx     Social History:  reports that he quit smoking about 22 years ago. His smoking use included Cigarettes. He has never used smokeless tobacco. He reports that he drinks alcohol. He reports that he does not use drugs.  Allergies: No Known Allergies  Medications: Prior to Admission medications   Medication Sig Start Date End Date Taking? Authorizing Provider  aspirin 81 MG EC tablet Take 1 tablet (81 mg total) by mouth daily. 12/17/13  Yes Larey Dresser, MD  atorvastatin (LIPITOR) 80 MG tablet Take 80 mg by mouth daily.    Yes Historical Provider, MD  carvedilol (COREG) 25 MG tablet Take 12.5 mg by mouth 2 (two) times daily with a meal.    Yes Historical Provider, MD  Cholecalciferol (VITAMIN D3) 2000 UNITS TABS Take 2,000 mg by mouth daily.   Yes Historical Provider, MD  digoxin (LANOXIN) 0.125 MG tablet TAKE 1/2 TABLET BY MOUTH DAILY 03/15/16  Yes Jolaine Artist, MD  ferrous sulfate 325 (65 FE) MG tablet Take 325 mg by mouth daily with breakfast.   Yes Historical Provider, MD  furosemide (LASIX) 40 MG tablet Take 40 mg by mouth daily.   Yes Historical Provider, MD  isosorbide dinitrate  (ISORDIL) 20 MG tablet Take 40 mg by mouth 3 (three) times daily.  09/29/15  Yes Historical Provider, MD  losartan (COZAAR) 25 MG tablet Take 25 mg by mouth daily.   Yes Historical Provider, MD  mirabegron ER (MYRBETRIQ) 25 MG TB24 tablet Take 25 mg by mouth daily.   Yes Historical Provider, MD  mupirocin ointment (BACTROBAN) 2 % Place 1 application into the nose 2 (two) times daily.   Yes Historical Provider, MD  NOVOLOG FLEXPEN 100 UNIT/ML FlexPen Inject 1-28 Units into the skin daily. Up to 28 units daily  By sliding scale 09/02/15  Yes Historical Provider, MD  pantoprazole (PROTONIX) 40 MG tablet TAKE ONE TABLET   BY MOUTH   DAILY 05/25/16  Yes Mahala Menghini, PA-C  urea (CARMOL) 40 % CREA Apply 1 application topically 2 (two) times daily. 05/27/16  Yes Historical Provider, MD  mirtazapine (REMERON) 7.5 MG tablet  Take 1 tablet by mouth daily. 06/09/16   Historical Provider, MD    Scheduled Meds: . aspirin  300 mg Rectal Daily   Or  . aspirin  325 mg Oral Daily  . atorvastatin  80 mg Oral Daily  . digoxin  62.5 mcg Oral Daily  . enoxaparin (LOVENOX) injection  40 mg Subcutaneous Q24H  . feeding supplement (ENSURE ENLIVE)  237 mL Oral BID BM  . insulin aspart  0-15 Units Subcutaneous TID WC  . isosorbide dinitrate  40 mg Oral TID  . mirabegron ER  25 mg Oral Daily  . multivitamin with minerals  1 tablet Oral Daily   Continuous Infusions: . sodium chloride 100 mL/hr at 06/29/16 0021   PRN Meds:.acetaminophen **OR** acetaminophen (TYLENOL) oral liquid 160 mg/5 mL **OR** acetaminophen     Results for orders placed or performed during the hospital encounter of 06/26/16 (from the past 48 hour(s))  Glucose, capillary     Status: Abnormal   Collection Time: 06/27/16  8:09 AM  Result Value Ref Range   Glucose-Capillary 56 (L) 65 - 99 mg/dL  Glucose, capillary     Status: Abnormal   Collection Time: 06/27/16  8:52 AM  Result Value Ref Range   Glucose-Capillary 106 (H) 65 - 99 mg/dL    Homocysteine     Status: Abnormal   Collection Time: 06/27/16 10:21 AM  Result Value Ref Range   Homocysteine 16.5 (H) 0.0 - 15.0 umol/L    Comment: (NOTE) Performed At: [facility] [place], [place] [id number] Lindon Romp MD PP:[id number]   Vitamin B12     Status: None   Collection Time: 06/27/16 10:41 AM  Result Value Ref Range   Vitamin B-12 421 180 - 914 pg/mL    Comment: (NOTE) This assay is not validated for testing neonatal or myeloproliferative syndrome specimens for Vitamin B12 levels. Performed at [facility], [place] [address], [place], [place] [address]   Glucose, capillary     Status: Abnormal   Collection Time: 06/27/16 11:44 AM  Result Value Ref Range   Glucose-Capillary 61 (L) 65 - 99 mg/dL  Glucose, capillary     Status: None   Collection Time: 06/27/16  1:16 PM  Result Value Ref Range   Glucose-Capillary 84 65 - 99 mg/dL  Troponin I     Status: Abnormal   Collection Time: 06/27/16  1:25 PM  Result Value Ref Range   Troponin I 0.36 (HH) <0.03 ng/mL    Comment: CRITICAL RESULT CALLED TO, READ BACK BY AND VERIFIED WITH: MORRIS,L AT 1440 ON 06/27/2016 BY ISLEY,B   Glucose, capillary     Status: Abnormal   Collection Time: 06/27/16  4:44 PM  Result Value Ref Range   Glucose-Capillary 109 (H) 65 - 99 mg/dL   Comment 1 Notify RN    Comment 2 Document in Chart   Glucose, capillary     Status: None   Collection Time: 06/27/16 10:10 PM  Result Value Ref Range   Glucose-Capillary 93 65 - 99 mg/dL   Comment 1 Notify RN    Comment 2 Document in Chart   Glucose, capillary     Status: Abnormal   Collection Time: 06/28/16  7:34 AM  Result Value Ref Range   Glucose-Capillary 61 (L) 65 - 99 mg/dL   Comment 1 Notify RN   Glucose, capillary     Status: None   Collection Time: 06/28/16  8:33 AM  Result Value Ref Range  Glucose-Capillary 87 65 - 99 mg/dL  Glucose, capillary     Status: Abnormal   Collection Time: 06/28/16  9:16 AM   Result Value Ref Range   Glucose-Capillary 112 (H) 65 - 99 mg/dL   Comment 1 Notify RN   Glucose, capillary     Status: Abnormal   Collection Time: 06/28/16 11:36 AM  Result Value Ref Range   Glucose-Capillary 116 (H) 65 - 99 mg/dL   Comment 1 Notify RN   Glucose, capillary     Status: Abnormal   Collection Time: 06/28/16  4:37 PM  Result Value Ref Range   Glucose-Capillary 117 (H) 65 - 99 mg/dL   Comment 1 Notify RN   Glucose, capillary     Status: None   Collection Time: 06/29/16 12:08 AM  Result Value Ref Range   Glucose-Capillary 85 65 - 99 mg/dL    Studies/Results:  HEAD CT FINDINGS: Brain: A left cerebellar infarct is noted, age indeterminate but new since 09/22/2015. There is no evidence of hemorrhage.  Atrophy, severe chronic small-vessel white matter ischemic changes and remote infarcts within the right cerebellum, pons, basal ganglia, thalami and right parietal regions again noted.  Vascular: Intracranial atherosclerotic calcifications noted.  Skull: Normal. Negative for fracture or focal lesion.  Sinuses/Orbits: No acute finding.  Other: None.  IMPRESSION: Age indeterminate left cerebellar infarct without hemorrhage, new since 09/22/2015.  Atrophy, chronic small-vessel white matter ischemic changes and remote infarcts as described.       CAROTID DOPPLERS FINDINGS: Criteria: Quantification of carotid stenosis is based on velocity parameters that correlate the residual internal carotid diameter with NASCET-based stenosis levels, using the diameter of the distal internal carotid lumen as the denominator for stenosis measurement.  The following velocity measurements were obtained:  RIGHT  ICA:  56/14 cm/sec  CCA:  50/27 cm/sec  SYSTOLIC ICA/CCA RATIO:  0.9  DIASTOLIC ICA/CCA RATIO:  1.1  ECA:  69 cm/sec  LEFT  ICA:  52/15 cm/sec  CCA:  74/12 cm/sec  SYSTOLIC ICA/CCA RATIO:  0.6  DIASTOLIC ICA/CCA RATIO:   1.3  ECA:  73 cm/sec  RIGHT CAROTID ARTERY: Right carotid bifurcation and proximal ICA atherosclerotic vascular disease. No flow limiting stenosis. Degree of stenosis less than 50%. Similar finding noted on prior exam. Scratched  RIGHT VERTEBRAL ARTERY:  Patent with antegrade flow.  LEFT CAROTID ARTERY: Left carotid bifurcation and proximal ICA atherosclerotic vascular disease. No flow limiting stenosis. Degree of stenosis less 50%. Similar findings noted on prior exam.  LEFT VERTEBRAL ARTERY:  Patent with antegrade flow.  IMPRESSION: One Mild bilateral carotid bifurcation and proximal ICA atherosclerotic vascular disease. Degree of stenosis less than 50% bilaterally. Similar findings noted on prior exam.  2. Vertebral arteries are patent with antegrade flow .      The brain MRI CT scan is reviewed in person. There is evidence of focal hyper density and a supplemental relation involving the right parietal frontal region and also the right frontal region. 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 There is moderate global atrophy. There is evidence of tiny and supplemental relation involving the right pointing region and to some degree the thalami bilaterally. There is a moderate area of insulin formulation involving the very year cerebral reason on the left side. This is consistent with a infarct of undetermined age.     Verle Wheeling A. Merlene Young, M.D.  Diplomate, Tax adviser of Psychiatry and Neurology ( Neurology). 06/29/2016, 8:05 AM

## 2016-06-29 NOTE — Progress Notes (Signed)
Physical Therapy Treatment Patient Details Name: Keith Young MRN: [id number] DOB: May 07, 1951 Today's Date: 06/29/2016    History of Present Illness 66 y.o. male with hx of systolic CHF EF 37% s/p ICD, CKD3, HLD, NSVT, prior CVA, DM2, HTN, brought to the ER from a group home as he has been intermittently more confused these past few days.  He also complained of sputum productive coughs, but no chest pain, fever, or chills.  Evaluation in the ER showed CXR with PNA.  Head CT shows a stroke that is new since April of last year. He can do MRI because he has a pacer defibrillator    PT Comments    Pt received in bed, asleep, and seems more fatigued today.  He continues to require assistance for all functional mobility, however today he required up to Mod A.  He demonstrates poor static sitting balance, but able to correct with light vc's and tc's.  He ambulated 69ft with RW and Mod A due to poor RW navigation.  Continue to recommend SNF.  Follow Up Recommendations  SNF     Equipment Recommendations  None recommended by PT    Recommendations for Other Services       Precautions / Restrictions Precautions Precautions: Fall Precaution Comments: 4-5 falls since his last admission.  Restrictions Weight Bearing Restrictions: No    Mobility  Bed Mobility Overal bed mobility: Needs Assistance Bed Mobility: Supine to Sit     Supine to sit: Mod assist        Transfers Overall transfer level: Needs assistance Equipment used: Rolling walker (2 wheeled) Transfers: Sit to/from Stand Sit to Stand: Mod assist         General transfer comment: Assist for static sitting balance on the commode after ambulation.    Ambulation/Gait Ambulation/Gait assistance: Mod assist Ambulation Distance (Feet): 20 Feet Assistive device: Rolling walker (2 wheeled) Gait Pattern/deviations: Shuffle;Ataxic;Step-to pattern   Gait velocity interpretation: <1.8 ft/sec, indicative of risk for recurrent  falls General Gait Details: Pt fatigues quickly.    Stairs            Wheelchair Mobility    Modified Rankin (Stroke Patients Only)       Balance Overall balance assessment: Needs assistance;History of Falls Sitting-balance support: Feet supported;Bilateral upper extremity supported   Sitting balance - Comments: Pt requires supervision for static sitting balance.  He does not demonstrate righting reaction, and slowly begins to lean posteriorly, or to the right or left.  He is able to correct with vc's,    Standing balance support: Bilateral upper extremity supported   Standing balance comment: UE's supported on RW.                     Cognition Arousal/Alertness: Lethargic Behavior During Therapy: Flat affect Overall Cognitive Status: Impaired/Different from baseline                      Exercises      General Comments        Pertinent Vitals/Pain Pain Assessment: No/denies pain    Home Living     Available Help at Discharge: Available 24 hours/day Type of Home: Group Home              Prior Function            PT Goals (current goals can now be found in the care plan section) Acute Rehab PT Goals Patient Stated Goal: To regain balance and  endurance PT Goal Formulation: With patient/family Time For Goal Achievement: 07/04/16 Potential to Achieve Goals: Fair Progress towards PT goals: Progressing toward goals    Frequency    Min 5X/week      PT Plan Current plan remains appropriate    Co-evaluation             End of Session Equipment Utilized During Treatment: Gait belt Activity Tolerance: Patient tolerated treatment well Patient left: in bed;with call bell/phone within reach     Time: 1537-1605 PT Time Calculation (min) (ACUTE ONLY): 28 min  Charges:  $Therapeutic Activity: 8-22 mins $Self Care/Home Management: 8-22                    G Codes:      Beth Shamari Trostel, PT, DPT X: [phone]

## 2016-06-29 NOTE — Progress Notes (Signed)
Subjective: He has no complaints this morning. He is oriented only to self. No chest pain. No nausea or vomiting.  Objective: Vital signs in last 24 hours: Temp:  [98.9 F (37.2 C)-99.6 F (37.6 C)] 98.9 F (37.2 C) (01/31 0715) Pulse Rate:  [94-98] 94 (01/31 0715) Resp:  [18] 18 (01/31 0715) BP: (130-143)/(66-80) 138/72 (01/31 0715) SpO2:  [100 %] 100 % (01/31 0715) Weight change:  Last BM Date: 06/27/16  Intake/Output from previous day: 01/30 0701 - 01/31 0700 In: 360 [P.O.:360] Out: -   PHYSICAL EXAM General appearance: alert, no distress and More confused than at baseline Resp: clear to auscultation bilaterally Cardio: Regular with systolic heart murmur GI: soft, non-tender; bowel sounds normal; no masses,  no organomegaly Extremities: extremities normal, atraumatic, no cyanosis or edema Skin warm and dry. Mucous membranes are moist.  Lab Results:  Results for orders placed or performed during the hospital encounter of 06/26/16 (from the past 48 hour(s))  Homocysteine     Status: Abnormal   Collection Time: 06/27/16 10:21 AM  Result Value Ref Range   Homocysteine 16.5 (H) 0.0 - 15.0 umol/L    Comment: (NOTE) Performed At: [facility] [place], [place] [id number] Lindon Romp MD [id number]   Vitamin B12     Status: None   Collection Time: 06/27/16 10:41 AM  Result Value Ref Range   Vitamin B-12 421 180 - 914 pg/mL    Comment: (NOTE) This assay is not validated for testing neonatal or myeloproliferative syndrome specimens for Vitamin B12 levels. Performed at [facility], [place] [address], [place], [place] [address]   Glucose, capillary     Status: Abnormal   Collection Time: 06/27/16 11:44 AM  Result Value Ref Range   Glucose-Capillary 61 (L) 65 - 99 mg/dL  Glucose, capillary     Status: None   Collection Time: 06/27/16  1:16 PM  Result Value Ref Range   Glucose-Capillary 84 65 - 99 mg/dL  Troponin I     Status: Abnormal    Collection Time: 06/27/16  1:25 PM  Result Value Ref Range   Troponin I 0.36 (HH) <0.03 ng/mL    Comment: CRITICAL RESULT CALLED TO, READ BACK BY AND VERIFIED WITH: MORRIS,L AT 1440 ON 06/27/2016 BY ISLEY,B   Glucose, capillary     Status: Abnormal   Collection Time: 06/27/16  4:44 PM  Result Value Ref Range   Glucose-Capillary 109 (H) 65 - 99 mg/dL   Comment 1 Notify RN    Comment 2 Document in Chart   Glucose, capillary     Status: None   Collection Time: 06/27/16 10:10 PM  Result Value Ref Range   Glucose-Capillary 93 65 - 99 mg/dL   Comment 1 Notify RN    Comment 2 Document in Chart   Glucose, capillary     Status: Abnormal   Collection Time: 06/28/16  7:34 AM  Result Value Ref Range   Glucose-Capillary 61 (L) 65 - 99 mg/dL   Comment 1 Notify RN   Glucose, capillary     Status: None   Collection Time: 06/28/16  8:33 AM  Result Value Ref Range   Glucose-Capillary 87 65 - 99 mg/dL  Glucose, capillary     Status: Abnormal   Collection Time: 06/28/16  9:16 AM  Result Value Ref Range   Glucose-Capillary 112 (H) 65 - 99 mg/dL   Comment 1 Notify RN   Glucose, capillary     Status: Abnormal  Collection Time: 06/28/16 11:36 AM  Result Value Ref Range   Glucose-Capillary 116 (H) 65 - 99 mg/dL   Comment 1 Notify RN   Glucose, capillary     Status: Abnormal   Collection Time: 06/28/16  4:37 PM  Result Value Ref Range   Glucose-Capillary 117 (H) 65 - 99 mg/dL   Comment 1 Notify RN   Glucose, capillary     Status: None   Collection Time: 06/29/16 12:08 AM  Result Value Ref Range   Glucose-Capillary 85 65 - 99 mg/dL  Glucose, capillary     Status: None   Collection Time: 06/29/16  8:01 AM  Result Value Ref Range   Glucose-Capillary 67 65 - 99 mg/dL   Comment 1 Notify RN     ABGS No results for input(s): PHART, PO2ART, TCO2, HCO3 in the last 72 hours.  Invalid input(s): PCO2 CULTURES Recent Results (from the past 240 hour(s))  Culture, Urine     Status: Abnormal    Collection Time: 06/24/16  9:00 AM  Result Value Ref Range Status   Specimen Description URINE, RANDOM  Final   Special Requests NONE  Final   Culture 60,000 COLONIES/mL ESCHERICHIA COLI (A)  Final   Report Status 06/27/2016 FINAL  Final   Organism ID, Bacteria ESCHERICHIA COLI (A)  Final      Susceptibility   Escherichia coli - MIC*    AMPICILLIN >=32 RESISTANT Resistant     CEFAZOLIN <=4 SENSITIVE Sensitive     CEFTRIAXONE <=1 SENSITIVE Sensitive     CIPROFLOXACIN <=0.25 SENSITIVE Sensitive     GENTAMICIN <=1 SENSITIVE Sensitive     IMIPENEM <=0.25 SENSITIVE Sensitive     NITROFURANTOIN <=16 SENSITIVE Sensitive     TRIMETH/SULFA <=20 SENSITIVE Sensitive     AMPICILLIN/SULBACTAM 16 INTERMEDIATE Intermediate     PIP/TAZO <=4 SENSITIVE Sensitive     Extended ESBL NEGATIVE Sensitive     * 60,000 COLONIES/mL ESCHERICHIA COLI   Studies/Results: US Carotid Bilateral (at [facility] Only)  Result Date: 06/27/2016 CLINICAL DATA:  TIA. EXAM: BILATERAL CAROTID DUPLEX ULTRASOUND TECHNIQUE: Pearline Cables scale imaging, color Doppler and duplex ultrasound were performed of bilateral carotid and vertebral arteries in the neck. COMPARISON:  CT 06/26/2016.  Ultrasound 12/25/2013. FINDINGS: Criteria: Quantification of carotid stenosis is based on velocity parameters that correlate the residual internal carotid diameter with NASCET-based stenosis levels, using the diameter of the distal internal carotid lumen as the denominator for stenosis measurement. The following velocity measurements were obtained: RIGHT ICA:  56/14 cm/sec CCA:  29/51 cm/sec SYSTOLIC ICA/CCA RATIO:  0.9 DIASTOLIC ICA/CCA RATIO:  1.1 ECA:  69 cm/sec LEFT ICA:  52/15 cm/sec CCA:  88/41 cm/sec SYSTOLIC ICA/CCA RATIO:  0.6 DIASTOLIC ICA/CCA RATIO:  1.3 ECA:  73 cm/sec RIGHT CAROTID ARTERY: Right carotid bifurcation and proximal ICA atherosclerotic vascular disease. No flow limiting stenosis. Degree of stenosis less than 50%. Similar finding noted on  prior exam. Scratched RIGHT VERTEBRAL ARTERY:  Patent with antegrade flow. LEFT CAROTID ARTERY: Left carotid bifurcation and proximal ICA atherosclerotic vascular disease. No flow limiting stenosis. Degree of stenosis less 50%. Similar findings noted on prior exam. LEFT VERTEBRAL ARTERY:  Patent with antegrade flow. IMPRESSION: One Mild bilateral carotid bifurcation and proximal ICA atherosclerotic vascular disease. Degree of stenosis less than 50% bilaterally. Similar findings noted on prior exam. 2. Vertebral arteries are patent with antegrade flow . Electronically Signed   By: Marcello Moores  Register   On: 06/27/2016 11:09    Medications:  Prior  to Admission:  Prescriptions Prior to Admission  Medication Sig Dispense Refill Last Dose  . aspirin 81 MG EC tablet Take 1 tablet (81 mg total) by mouth daily.   06/26/2016 at Unknown time  . atorvastatin (LIPITOR) 80 MG tablet Take 80 mg by mouth daily.    06/26/2016 at Unknown time  . carvedilol (COREG) 25 MG tablet Take 12.5 mg by mouth 2 (two) times daily with a meal.    06/26/2016 at 800  . Cholecalciferol (VITAMIN D3) 2000 UNITS TABS Take 2,000 mg by mouth daily.   06/26/2016 at Unknown time  . digoxin (LANOXIN) 0.125 MG tablet TAKE 1/2 TABLET BY MOUTH DAILY 15 tablet 3 06/26/2016 at Unknown time  . ferrous sulfate 325 (65 FE) MG tablet Take 325 mg by mouth daily with breakfast.   06/26/2016 at Unknown time  . furosemide (LASIX) 40 MG tablet Take 40 mg by mouth daily.   06/03/2016 at [place]  . isosorbide dinitrate (ISORDIL) 20 MG tablet Take 40 mg by mouth 3 (three) times daily.    06/26/2016 at Unknown time  . losartan (COZAAR) 25 MG tablet Take 25 mg by mouth daily.   06/26/2016 at Unknown time  . mirabegron ER (MYRBETRIQ) 25 MG TB24 tablet Take 25 mg by mouth daily.   06/26/2016 at 800  . mupirocin ointment (BACTROBAN) 2 % Place 1 application into the nose 2 (two) times daily.   06/26/2016 at Unknown time  . NOVOLOG FLEXPEN 100 UNIT/ML FlexPen Inject 1-28 Units  into the skin daily. Up to 28 units daily  By sliding scale   unknown  . pantoprazole (PROTONIX) 40 MG tablet TAKE ONE TABLET   BY MOUTH   DAILY 30 tablet 11 06/25/2016 at Unknown time  . urea (CARMOL) 40 % CREA Apply 1 application topically 2 (two) times daily.  5   . mirtazapine (REMERON) 7.5 MG tablet Take 1 tablet by mouth daily.     . [DISCONTINUED] levofloxacin (LEVAQUIN) 500 MG tablet Take 1 tablet (500 mg total) by mouth daily. (Patient not taking: Reported on 06/26/2016) 7 tablet 0 Completed Course at Unknown time   Scheduled: . aspirin  300 mg Rectal Daily   Or  . aspirin  325 mg Oral Daily  . atorvastatin  80 mg Oral Daily  . digoxin  62.5 mcg Oral Daily  . enoxaparin (LOVENOX) injection  40 mg Subcutaneous Q24H  . feeding supplement (ENSURE ENLIVE)  237 mL Oral BID BM  . insulin aspart  0-15 Units Subcutaneous TID WC  . isosorbide dinitrate  40 mg Oral TID  . mirabegron ER  25 mg Oral Daily  . multivitamin with minerals  1 tablet Oral Daily   Continuous: . sodium chloride 100 mL/hr at 06/29/16 0021   OXB:[id number] **OR** acetaminophen (TYLENOL) oral liquid 160 mg/5 mL **OR** acetaminophen  Assesment: It appears that he has had a stroke. He had EEG that did not show seizure disorder although that was a clinical concern. He is essentially unchanged. He is still confused. He has multiple other medical problems including heart failure with severely reduced ejection fraction chronically elevated troponin level ICD in place chronic kidney disease with anemia related to that diabetes with excellent control based on his A1c cirrhosis of the liver and pancytopenia from that and malnutrition. Principal Problem:   Altered mental status Active Problems:   CKD (chronic kidney disease)   Diabetes mellitus type 2 in nonobese (HCC)   Anemia in chronic kidney disease   ICD (implantable  cardioverter-defibrillator) in place   Elevated troponin   Pancytopenia (HCC)   CVA (cerebral  vascular accident) ([place])   Severe malnutrition ([place])   Malnutrition of moderate degree    Plan: Continue treatments. He's going to be sent to a nursing home potentially tomorrow.    LOS: 3 days   Matthe Sloane L 06/29/2016, 9:11 AM

## 2016-06-29 NOTE — Evaluation (Signed)
Speech Language Pathology Evaluation Patient Details Name: Keith Young MRN: [id number] DOB: 1951/04/20 Today's Date: 06/29/2016 Time: 1410-1500 SLP Time Calculation (min) (ACUTE ONLY): 50 min  Problem List:  Patient Active Problem List   Diagnosis Date Noted  . Malnutrition of moderate degree 06/28/2016  . Altered mental status 06/26/2016  . CVA (cerebral vascular accident) ([place]) 06/26/2016  . Severe malnutrition ([place]) 06/26/2016  . HCAP (healthcare-associated pneumonia) 06/04/2016  . Mucosal abnormality of stomach   . Gastritis and gastroduodenitis 09/30/2015  . Heme positive stool 09/30/2015  . Pancytopenia ([place]) 09/30/2015  . Chest pain 04/28/2015  . Elevated troponin 04/28/2015  . ICD (implantable cardioverter-defibrillator) in place 01/22/2015  . Anemia 09/30/2014  . Gastric polyp   . Reflux esophagitis   . Elevated LFTs 03/26/2014  . Anemia in chronic kidney disease 03/26/2014  . Carotid bruit 12/18/2013  . Acute on chronic systolic heart failure ([place]) 10/24/2013  . CHF (congestive heart failure), NYHA class III ([place]) 10/23/2013  . Cough 10/23/2013  . CKD (chronic kidney disease) 10/23/2013  . Diabetes mellitus type 2 in nonobese ([place]) 10/23/2013  . Essential hypertension, benign 10/23/2013  . Hyperkalemia 10/23/2013  . CAD (coronary artery disease) 10/23/2013  . History of stroke 10/23/2013  . Anemia of chronic kidney failure 10/23/2013  . Other and unspecified hyperlipidemia 10/23/2013   Past Medical History:  Past Medical History:  Diagnosis Date  . AICD (automatic cardioverter/defibrillator) present   . Alcoholism in remission ([place])   . Anemia   . Aortic insufficiency   . Arthritis   . At moderate risk for fall   . Bell's palsy   . Cardiomyopathy ([place])   . Chronic systolic heart failure (HCC)    NYHA Class III  . CKD (chronic kidney disease) stage 3, GFR 30-59 ml/min   . COPD (chronic obstructive pulmonary disease) ([place])   . Essential hypertension,  benign   . Hyperlipidemia   . Noncompliance with medication regimen   . Nonischemic cardiomyopathy (HCC)    EF 15-20%  . NSVT (nonsustained ventricular tachycardia) ([place])   . Numbness of right jaw    Had a stoke in 01/2013. Numbness is occasional, especially when trying to chew.  . Productive cough 08/2013   With brown sputum   . SOB (shortness of breath) on exertion 08/2013  . Stroke ([place]) 01/2013   weakness of right side from CVA  . Type 2 diabetes mellitus ([place])   . Uses hearing aid 2014   recently received new hearing aids   Past Surgical History:  Past Surgical History:  Procedure Laterality Date  . BIOPSY N/A 04/10/2014   Procedure: GASTRIC BIOPSY;  Surgeon: Daneil Dolin, MD;  Location: [facility] ORS;  Service: Endoscopy;  Laterality: N/A;  . BIOPSY  10/12/2015   Procedure: BIOPSY;  Surgeon: Daneil Dolin, MD;  Location: [facility] ENDO SUITE;  Service: Endoscopy;;  stomach bx's  . CARDIAC DEFIBRILLATOR PLACEMENT  11/12/12   [employer] Inogen MINI ICD implanted in [place] at [facility] per Dr Nelly Laurence note  . COLONOSCOPY WITH PROPOFOL N/A 04/10/2014   RMR: Colonic Diverticulosis  . ESOPHAGOGASTRODUODENOSCOPY (EGD) WITH PROPOFOL N/A 04/10/2014   RMR: Mild erosive reflux esophagitis. Multiple antral polyps likely hyperplastic status post removal by hot snare cautery technique. Diffusely abnormal stomach status post gastric biopsy I suspect some of patients anemaia may be due to intermittent oozing from the stomach. It would be difficult and a risky proposition to  attempt complete removal of all of his gastric polyps.  . ESOPHAGOGASTRODUODENOSCOPY (EGD) WITH PROPOFOL N/A 10/12/2015   Procedure: ESOPHAGOGASTRODUODENOSCOPY (EGD) WITH PROPOFOL;  Surgeon: Daneil Dolin, MD;  Location: [facility];  Service: Endoscopy;  Laterality: N/A;  8119 - moved to 9:15  . POLYPECTOMY N/A 04/10/2014   Procedure: GASTRIC POLYPECTOMY;  Surgeon: Daneil Dolin, MD;  Location: [facility] ORS;  Service: Endoscopy;  Laterality: N/A;  . RIGHT HEART CATHETERIZATION N/A 02/06/2014   Procedure: RIGHT HEART CATH;  Surgeon: Larey Dresser, MD;  Location: [facility] CATH LAB;  Service: Cardiovascular;  Laterality: N/A;   HPI:   66 y.o. male with hx of systolic CHF EF 14% s/p ICD, CKD3, HLD, NSVT, prior CVA, DM2, HTN, brought to the ER from a group home as he has been intermittently more confused these past few days.  He also complained of sputum productive coughs, but no chest pain, fever, or chills.  Evaluation in the ER showed CXR with PNA.  Head CT shows a stroke that is new since April of last year. He can not do MRI because he has a pacer defibrillator. BSE ordered d/t failing swallow screen in ED. SLE d/t more confusion,    Assessment / Plan / Recommendation Clinical Impression   Pt alert and cooperative during visit. Pt presents with moderate cognitive deficits. He required mod verbal cues to recall new information and answered basic problem solving questions. Regarding expression, he demonstrates difficulty with abstract naming tasks. Receptively, he had difficulty following directions. Basic writing and reading skills assessed - WFL. He was oriented x4. He benefited from  extra time to answer questions throughout evaluation. He expressed basic wants/needs ("Can I have water?") with no difficulty. ST recommending SNF placement to address above deficits.     SLP Assessment  All further Speech Lanaguage Pathology  needs can be addressed in the next venue of care    Follow Up Recommendations  Skilled Nursing facility    Frequency and Duration           SLP Evaluation Cognition  Overall Cognitive Status: Impaired/Different from baseline Arousal/Alertness: Awake/alert Orientation Level: Oriented to person;Oriented to place;Disoriented to time Attention: Focused Focused Attention: Impaired Memory: Impaired Memory Impairment: Decreased recall of new  information Awareness: Appears intact Problem Solving: Impaired Problem Solving Impairment: Verbal basic Executive Function: Decision Making Decision Making: Impaired Decision Making Impairment: Verbal basic Safety/Judgment: Appears intact       Comprehension  Auditory Comprehension Yes/No Questions: Within Functional Limits Commands: Impaired Conversation: Simple EffectiveTechniques: Extra processing time Visual Recognition/Discrimination Discrimination: Within Function Limits Reading Comprehension Reading Status: Within funtional limits (assessed basic )    Expression Expression Primary Mode of Expression: Verbal Verbal Expression Overall Verbal Expression: Appears within functional limits for tasks assessed Initiation: No impairment Automatic Speech: Name;Day of week;Month of year Repetition: No impairment Naming: Impairment Confrontation: Within functional limits Convergent: 50-74% accurate Divergent: 50-74% accurate Written Expression Dominant Hand: Right Written Expression: Within Functional Limits   Oral / Motor  Oral Motor/Sensory Function Overall Oral Motor/Sensory Function: Mild impairment Facial ROM: Reduced left Facial Symmetry: Abnormal symmetry left Lingual ROM: Reduced left Lingual Symmetry: Abnormal symmetry left Lingual Strength: Reduced Motor Speech Overall Motor Speech: Appears within functional limits for tasks assessed Respiration: Within functional limits Phonation: Normal Resonance: Within functional limits Intelligibility: Intelligible Motor Planning: Witnin functional limits   Thank you,   Renato Gails. Megan Salon, MS, CCC-SLP  Speech-Language Pathologist [phone]  Luther Redo 06/29/2016, 3:38 PM

## 2016-06-30 ENCOUNTER — Inpatient Hospital Stay
Admission: RE | Admit: 2016-06-30 | Discharge: 2016-07-21 | Disposition: A | Discharge status: Still In Hospital | Payer: Medicare HMO | Source: Ambulatory Visit | Attending: Pulmonary Disease | Admitting: Pulmonary Disease

## 2016-06-30 LAB — GLUCOSE, CAPILLARY
GLUCOSE-CAPILLARY: 92 mg/dL (ref 65–99)
GLUCOSE-CAPILLARY: 96 mg/dL (ref 65–99)
Glucose-Capillary: 64 mg/dL — ABNORMAL LOW (ref 65–99)

## 2016-06-30 MED ORDER — ENSURE ENLIVE PO LIQD: 237.0000 mL | Freq: Two times a day (BID) | ORAL | 12 refills | Status: DC

## 2016-06-30 MED ORDER — ADULT MULTIVITAMIN W/MINERALS CH: 1.0000 | ORAL_TABLET | Freq: Every day | ORAL | Status: DC

## 2016-06-30 MED ORDER — ACETAMINOPHEN 325 MG PO TABS: 650.0000 mg | ORAL_TABLET | ORAL | Status: AC | PRN

## 2016-06-30 MED ORDER — ASPIRIN 325 MG PO TABS: 325.0000 mg | ORAL_TABLET | Freq: Every day | ORAL | Status: DC

## 2016-06-30 NOTE — Progress Notes (Signed)
Physical Therapy Treatment Patient Details Name: ARIV PENROD MRN: [id number] DOB: 01-01-1951 Today's Date: 06/30/2016    History of Present Illness 66 y.o. male with hx of systolic CHF EF 10% s/p ICD, CKD3, HLD, NSVT, prior CVA, DM2, HTN, brought to the ER from a group home as he has been intermittently more confused these past few days.  He also complained of sputum productive coughs, but no chest pain, fever, or chills.  Evaluation in the ER showed CXR with PNA.  Head CT shows a stroke that is new since April of last year. He can do MRI because he has a pacer defibrillator    PT Comments    Pt supine in bed and agreeable to participate with therapy today.  No reports of pain.  Pt required increased time and cueing for techniques to improve motor control with bed mobility, transfers and gait.  Increased distance with gait training with cueing to improve direction to avoid obstacles in hallway and min A required for direction.  Pt left in chair with call bell within reach, reclined chair and RN aware of status.  Pt was limited by fatigue, no reports of pain through session.     Follow Up Recommendations  SNF     Equipment Recommendations  None recommended by PT    Recommendations for Other Services       Precautions / Restrictions Precautions Precautions: Fall Precaution Comments: 4-5 falls since his last admission.  Restrictions Weight Bearing Restrictions: No    Mobility  Bed Mobility Overal bed mobility: Needs Assistance       Supine to sit: Min assist     General bed mobility comments: assistance for motor planning with supine<>sit; increased time to complete  Transfers Overall transfer level: Needs assistance Equipment used: Rolling walker (2 wheeled) Transfers: Sit to/from Stand Sit to Stand: Min assist         General transfer comment: Improved static sitting balance on EOB with arm reaches outside BOS; Min A and cueing for hand placement for sit to stand  from bed, increased time for motor planning  Ambulation/Gait Ambulation/Gait assistance: Min assist Ambulation Distance (Feet): 55 Feet Assistive device: Rolling walker (2 wheeled) Gait Pattern/deviations: Shuffle;Ataxic;Step-to pattern   Gait velocity interpretation: Below normal speed for age/gender General Gait Details: Pt fatigues quickly.    Stairs            Wheelchair Mobility    Modified Rankin (Stroke Patients Only)       Balance                                    Cognition Arousal/Alertness: Lethargic Behavior During Therapy: Flat affect Overall Cognitive Status: Impaired/Different from baseline Area of Impairment: Orientation Orientation Level: Disoriented to;Time;Situation;Place                  Exercises      General Comments        Pertinent Vitals/Pain Pain Assessment: No/denies pain    Home Living                      Prior Function            PT Goals (current goals can now be found in the care plan section)      Frequency    Min 5X/week      PT Plan Current plan remains appropriate  Co-evaluation             End of Session Equipment Utilized During Treatment: Gait belt Activity Tolerance: Patient tolerated treatment well Patient left: with call bell/phone within reach;Other (comment) (Nurse tech aware of pt status)     Time: 6222-9798 PT Time Calculation (min) (ACUTE ONLY): 22 min  Charges:  $Gait Training: 8-22 mins $Therapeutic Activity: 8-22 mins                    G Codes:     Ihor Austin, LPTA; CBIS [phone]  Aldona Lento 06/30/2016, 12:25 PM

## 2016-06-30 NOTE — Discharge Summary (Signed)
Physician Discharge Summary  Patient ID: Keith Young MRN: [id number] DOB/AGE: 1951-01-10 66 y.o. Primary Care Physician:Lorenz Donley L, MD Admit date: 06/26/2016 Discharge date: 06/30/2016    Discharge Diagnoses:   Principal Problem:   Altered mental status Active Problems:   CKD (chronic kidney disease)   Diabetes mellitus type 2 in nonobese (HCC)   Anemia in chronic kidney disease   ICD (implantable cardioverter-defibrillator) in place   Elevated troponin   Pancytopenia (HCC)   CVA (cerebral vascular accident) ([place])   Severe malnutrition ([place])   Malnutrition of moderate degree Hypoglycemia  Allergies as of 06/30/2016   No Known Allergies     Medication List    STOP taking these medications   aspirin 81 MG EC tablet Replaced by:  aspirin 325 MG tablet   mupirocin ointment 2 % Commonly known as:  BACTROBAN   NOVOLOG FLEXPEN 100 UNIT/ML FlexPen Generic drug:  insulin aspart   urea 40 % Crea Commonly known as:  CARMOL     TAKE these medications   acetaminophen 325 MG tablet Commonly known as:  TYLENOL Take 2 tablets (650 mg total) by mouth every 4 (four) hours as needed for mild pain (or temp > 37.5 C (99.5 F)).   aspirin 325 MG tablet Take 1 tablet (325 mg total) by mouth daily. Replaces:  aspirin 81 MG EC tablet   atorvastatin 80 MG tablet Commonly known as:  LIPITOR Take 80 mg by mouth daily.   carvedilol 25 MG tablet Commonly known as:  COREG Take 12.5 mg by mouth 2 (two) times daily with a meal.   digoxin 0.125 MG tablet Commonly known as:  LANOXIN TAKE 1/2 TABLET BY MOUTH DAILY   feeding supplement (ENSURE ENLIVE) Liqd Take 237 mLs by mouth 2 (two) times daily between meals.   ferrous sulfate 325 (65 FE) MG tablet Take 325 mg by mouth daily with breakfast.   furosemide 40 MG tablet Commonly known as:  LASIX Take 40 mg by mouth daily.   isosorbide dinitrate 20 MG tablet Commonly known as:  ISORDIL Take 40 mg by mouth 3 (three) times  daily.   losartan 25 MG tablet Commonly known as:  COZAAR Take 25 mg by mouth daily.   mirtazapine 7.5 MG tablet Commonly known as:  REMERON Take 1 tablet by mouth daily.   multivitamin with minerals Tabs tablet Take 1 tablet by mouth daily.   MYRBETRIQ 25 MG Tb24 tablet Generic drug:  mirabegron ER Take 25 mg by mouth daily.   pantoprazole 40 MG tablet Commonly known as:  PROTONIX TAKE ONE TABLET   BY MOUTH   DAILY   Vitamin D3 2000 units Tabs Take 2,000 mg by mouth daily.       Discharged Condition:Improved    Consults: Neurology/Dr. Merlene Laughter  Significant Diagnostic Studies: Dg Chest 2 View  Result Date: 06/26/2016 CLINICAL DATA:  Patient with altered mental status. EXAM: CHEST  2 VIEW COMPARISON:  A chest radiograph 06/03/2016. FINDINGS: Single lead AICD device overlies the left hemithorax. Stable cardiomegaly status post median sternotomy. No consolidative pulmonary opacities. No pleural effusion or pneumothorax. Regional skeleton is unremarkable. IMPRESSION: No active cardiopulmonary disease. Electronically Signed   By: Lovey Newcomer M.D.   On: 06/26/2016 16:18   Dg Chest 2 View  Result Date: 06/04/2016 CLINICAL DATA:  Altered mental status.  Fever. EXAM: CHEST  2 VIEW COMPARISON:  04/28/2015 FINDINGS: There is unchanged moderate cardiomegaly. 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 There are intact appearances of the transvenous  cardiac lead. Patchy opacity in the retrocardiac left base could represent early infectious infiltrate. Right lung is clear. No pleural effusions. IMPRESSION: Patchy left base opacity, possibly infectious. Unchanged cardiomegaly. Electronically Signed   By: Andreas Newport M.D.   On: 06/04/2016 00:04   Ct Head Wo Contrast  Result Date: 06/26/2016 CLINICAL DATA:  66 year old male with altered mental status. EXAM: CT HEAD WITHOUT CONTRAST TECHNIQUE: Contiguous axial images were obtained from the base of the skull through the vertex without  intravenous contrast. COMPARISON:  09/22/2015 and 04/28/2015 head CTs FINDINGS: Brain: A left cerebellar infarct is noted, age indeterminate but new since 09/22/2015. There is no evidence of hemorrhage. Atrophy, severe chronic small-vessel white matter ischemic changes and remote infarcts within the right cerebellum, pons, basal ganglia, thalami and right parietal regions again noted. Vascular: Intracranial atherosclerotic calcifications noted. Skull: Normal. Negative for fracture or focal lesion. Sinuses/Orbits: No acute finding. Other: None. IMPRESSION: Age indeterminate left cerebellar infarct without hemorrhage, new since 09/22/2015. Atrophy, chronic small-vessel white matter ischemic changes and remote infarcts as described. Electronically Signed   By: Margarette Canada M.D.   On: 06/26/2016 16:26   US Carotid Bilateral (at [facility] Only)  Result Date: 06/27/2016 CLINICAL DATA:  TIA. EXAM: BILATERAL CAROTID DUPLEX ULTRASOUND TECHNIQUE: Pearline Cables scale imaging, color Doppler and duplex ultrasound were performed of bilateral carotid and vertebral arteries in the neck. COMPARISON:  CT 06/26/2016.  Ultrasound 12/25/2013. FINDINGS: Criteria: Quantification of carotid stenosis is based on velocity parameters that correlate the residual internal carotid diameter with NASCET-based stenosis levels, using the diameter of the distal internal carotid lumen as the denominator for stenosis measurement. The following velocity measurements were obtained: RIGHT ICA:  56/14 cm/sec CCA:  10/93 cm/sec SYSTOLIC ICA/CCA RATIO:  0.9 DIASTOLIC ICA/CCA RATIO:  1.1 ECA:  69 cm/sec LEFT ICA:  52/15 cm/sec CCA:  23/55 cm/sec SYSTOLIC ICA/CCA RATIO:  0.6 DIASTOLIC ICA/CCA RATIO:  1.3 ECA:  73 cm/sec RIGHT CAROTID ARTERY: Right carotid bifurcation and proximal ICA atherosclerotic vascular disease. No flow limiting stenosis. Degree of stenosis less than 50%. Similar finding noted on prior exam. Scratched RIGHT VERTEBRAL ARTERY:  Patent with  antegrade flow. LEFT CAROTID ARTERY: Left carotid bifurcation and proximal ICA atherosclerotic vascular disease. No flow limiting stenosis. Degree of stenosis less 50%. Similar findings noted on prior exam. LEFT VERTEBRAL ARTERY:  Patent with antegrade flow. IMPRESSION: One Mild bilateral carotid bifurcation and proximal ICA atherosclerotic vascular disease. Degree of stenosis less than 50% bilaterally. Similar findings noted on prior exam. 2. Vertebral arteries are patent with antegrade flow . Electronically Signed   By: Marcello Moores  Register   On: 06/27/2016 11:09    Lab Results: Basic Metabolic Panel: No results for input(s): NA, K, CL, CO2, GLUCOSE, BUN, CREATININE, CALCIUM, MG, PHOS in the last 72 hours. Liver Function Tests: No results for input(s): AST, ALT, ALKPHOS, BILITOT, PROT, ALBUMIN in the last 72 hours.   CBC: No results for input(s): WBC, NEUTROABS, HGB, HCT, MCV, PLT in the last 72 hours.  Recent Results (from the past 240 hour(s))  Culture, Urine     Status: Abnormal   Collection Time: 06/24/16  9:00 AM  Result Value Ref Range Status   Specimen Description URINE, RANDOM  Final   Special Requests NONE  Final   Culture 60,000 COLONIES/mL ESCHERICHIA COLI (A)  Final   Report Status 06/27/2016 FINAL  Final   Organism ID, Bacteria ESCHERICHIA COLI (A)  Final      Susceptibility   Escherichia  coli - MIC*    AMPICILLIN >=32 RESISTANT Resistant     CEFAZOLIN <=4 SENSITIVE Sensitive     CEFTRIAXONE <=1 SENSITIVE Sensitive     CIPROFLOXACIN <=0.25 SENSITIVE Sensitive     GENTAMICIN <=1 SENSITIVE Sensitive     IMIPENEM <=0.25 SENSITIVE Sensitive     NITROFURANTOIN <=16 SENSITIVE Sensitive     TRIMETH/SULFA <=20 SENSITIVE Sensitive     AMPICILLIN/SULBACTAM 16 INTERMEDIATE Intermediate     PIP/TAZO <=4 SENSITIVE Sensitive     Extended ESBL NEGATIVE Sensitive     * 60,000 COLONIES/mL ESCHERICHIA COLI     Hospital Course: This is a 66 year old who had been in the hospital with  pneumonia. He improved and was sent back to his assisted living facility but since that time he has had increasing problems with not being is responsive. CT showed what seems to be a new stroke since April. It's not quite clear when that happened. He had consultation with neurology and had EEG. His EEG did not show seizure disorder. After evaluation he was felt to need skilled care facility treatment now and he is going to be transferred to the skilled care facility.  Discharge Exam: Blood pressure 132/78, pulse 84, temperature 98.7 F (37.1 C), temperature source Oral, resp. rate 20, height 5\' 3"  (1.6 m), weight 53 kg (116 lb 12.8 oz), SpO2 98 %. He's awake and alert mildly disoriented. Chest is clear his heart is regular with systolic heart murmur. No edema.  Disposition: To skilled care facility for PT OT and speech as needed. He will be on a dysphagia 2 diet. He had been on sliding scale insulin in the hospital but his luggage sugar Going low so I'm going to hold on the sliding scale have him get Accu-Cheks 4 times a day and then report those to me in about 5 days.  Discharge Instructions    Diet - low sodium heart healthy    Complete by:  As directed    Increase activity slowly    Complete by:  As directed         Signed: Syanna Remmert L   06/30/2016, 8:56 AM

## 2016-06-30 NOTE — Clinical Social Work Placement (Signed)
   CLINICAL SOCIAL WORK PLACEMENT  NOTE  Date:  06/30/2016  Patient Details  Name: Keith Young MRN: [id number] Date of Birth: [birth year]  Clinical Social Work is seeking post-discharge placement for this patient at the [place] level of care (*CSW will initial, date and re-position this form in  chart as items are completed):  Yes   Patient/family provided with [place] Work Department's list of facilities offering this level of care within the geographic area requested by the patient (or if unable, by the patient's family).  Yes   Patient/family informed of their freedom to choose among providers that offer the needed level of care, that participate in Medicare, Medicaid or managed care program needed by the patient, have an available bed and are willing to accept the patient.  Yes   Patient/family informed of [facility]'s ownership interest in [facility] and [facility], as well as of the fact that they are under no obligation to receive care at these facilities.  PASRR submitted to EDS on       PASRR number received on       Existing PASRR number confirmed on 06/27/16     FL2 transmitted to all facilities in geographic area requested by pt/family on 06/27/16     FL2 transmitted to all facilities within larger geographic area on       Patient informed that his/her managed care company has contracts with or will negotiate with certain facilities, including the following:        Yes   Patient/family informed of bed offers received.  Patient chooses bed at [facility]     Physician recommends and patient chooses bed at      Patient to be transferred to [facility] on 06/30/16.  Patient to be transferred to facility by staff     Patient family notified on 06/30/16 of transfer.  Name of family member notified:  Ms. [name]- sister     PHYSICIAN       Additional Comment:     _______________________________________________ Salome Arnt, [place] 06/30/2016, 10:01 AM [phone]

## 2016-06-30 NOTE — NC FL2 (Signed)
MEDICAID FL2 LEVEL OF CARE SCREENING TOOL     IDENTIFICATION  Patient Name: Keith Young Birthdate: 07-08-1950 Sex: male Admission Date (Current Location): 06/26/2016  [facility] and [place] Number:  [employer] and Address:  [place] [address], [place]      Provider Number: [id number]  Attending Physician Name and Address:  Sinda Du, MD  Relative Name and Phone Number:       Current Level of Care: Hospital Recommended Level of Care: [place] Prior Approval Number:    Date Approved/Denied:   PASRR Number: [id number]  Discharge Plan: SNF    Current Diagnoses: Patient Active Problem List   Diagnosis Date Noted  . Malnutrition of moderate degree 06/28/2016  . Altered mental status 06/26/2016  . CVA (cerebral vascular accident) ([place]) 06/26/2016  . Severe malnutrition ([place]) 06/26/2016  . HCAP (healthcare-associated pneumonia) 06/04/2016  . Mucosal abnormality of stomach   . Gastritis and gastroduodenitis 09/30/2015  . Heme positive stool 09/30/2015  . Pancytopenia ([place]) 09/30/2015  . Chest pain 04/28/2015  . Elevated troponin 04/28/2015  . ICD (implantable cardioverter-defibrillator) in place 01/22/2015  . Anemia 09/30/2014  . Gastric polyp   . Reflux esophagitis   . Elevated LFTs 03/26/2014  . Anemia in chronic kidney disease 03/26/2014  . Carotid bruit 12/18/2013  . Acute on chronic systolic heart failure ([place]) 10/24/2013  . CHF (congestive heart failure), NYHA class III ([place]) 10/23/2013  . Cough 10/23/2013  . CKD (chronic kidney disease) 10/23/2013  . Diabetes mellitus type 2 in nonobese ([place]) 10/23/2013  . Essential hypertension, benign 10/23/2013  . Hyperkalemia 10/23/2013  . CAD (coronary artery disease) 10/23/2013  . History of stroke 10/23/2013  . Anemia of chronic kidney failure 10/23/2013  . Other and unspecified hyperlipidemia 10/23/2013    Orientation RESPIRATION BLADDER  Height & Weight     Self, Place  Normal Incontinent Weight: 116 lb 12.8 oz (53 kg) Height:  5\' 3"  (160 cm)  BEHAVIORAL SYMPTOMS/MOOD NEUROLOGICAL BOWEL NUTRITION STATUS  Other (Comment) (none)  (n/a) Incontinent Diet (Dysphagia 2 )  AMBULATORY STATUS COMMUNICATION OF NEEDS Skin   Limited Assist Verbally Normal                       Personal Care Assistance Level of Assistance    Bathing Assistance: Limited assistance Feeding assistance: Limited assistance Dressing Assistance: Limited assistance     Functional Limitations Info  Sight, Hearing, Speech Sight Info: Adequate Hearing Info: Adequate Speech Info: Adequate    SPECIAL CARE FACTORS FREQUENCY  PT (By licensed PT)     PT Frequency: 5              Contractures      Additional Factors Info  Insulin Sliding Scale Code Status Info: Full code Allergies Info: No known allergies           Current Medications (06/30/2016):  This is the current hospital active medication list Current Facility-Administered Medications  Medication Dose Route Frequency Provider Last Rate Last Dose  . 0.9 %  sodium chloride infusion   Intravenous Continuous Francine Graven, DO 100 mL/hr at 06/30/16 0547    . acetaminophen (TYLENOL) tablet 650 mg  650 mg Oral Q4H PRN Karmen Bongo, MD       Or  . acetaminophen (TYLENOL) solution 650 mg  650 mg Per Tube Q4H PRN Karmen Bongo, MD       Or  . acetaminophen (  TYLENOL) suppository 650 mg  650 mg Rectal Q4H PRN Karmen Bongo, MD      . aspirin suppository 300 mg  300 mg Rectal Daily Karmen Bongo, MD       Or  . aspirin tablet 325 mg  325 mg Oral Daily Karmen Bongo, MD   325 mg at 06/29/16 1018  . atorvastatin (LIPITOR) tablet 80 mg  80 mg Oral Daily Karmen Bongo, MD   80 mg at 06/29/16 1017  . digoxin (LANOXIN) tablet 62.5 mcg  62.5 mcg Oral Daily Karmen Bongo, MD   62.5 mcg at 06/29/16 1017  . enoxaparin (LOVENOX) injection 40 mg  40 mg Subcutaneous Q24H Karmen Bongo, MD    40 mg at 06/29/16 1017  . feeding supplement (ENSURE ENLIVE) (ENSURE ENLIVE) liquid 237 mL  237 mL Oral BID BM Sinda Du, MD   237 mL at 06/29/16 1400  . insulin aspart (novoLOG) injection 0-15 Units  0-15 Units Subcutaneous TID WC Karmen Bongo, MD      . isosorbide dinitrate (ISORDIL) tablet 40 mg  40 mg Oral TID Karmen Bongo, MD   40 mg at 06/29/16 2159  . mirabegron ER (MYRBETRIQ) tablet 25 mg  25 mg Oral Daily Karmen Bongo, MD   25 mg at 06/29/16 1018  . multivitamin with minerals tablet 1 tablet  1 tablet Oral Daily Sinda Du, MD   1 tablet at 06/29/16 1017     Discharge Medications: Please see discharge summary for a list of discharge medications.  Relevant Imaging Results:  Relevant Lab Results:   Additional Information SSN: [id number].   Benay Pike N[place], [place]

## 2016-06-30 NOTE — Care Management Important Message (Signed)
Important Message  Patient Details  Name: Keith Young MRN: [id number] Date of Birth: 30-Oct-1950   Medicare Important Message Given:  Yes    Sherald Barge, RN 06/30/2016, 11:28 AM

## 2016-06-30 NOTE — Progress Notes (Signed)
He is overall about the same. He is ready for transfer to skilled care facility. Please see discharge summary for details

## 2016-06-30 NOTE — Progress Notes (Signed)
Hypoglycemic Event  CBG: 64  Treatment: 15 GM carbohydrate snack  Symptoms: None  Follow-up CBG: Time:0900 CBG Result:96  Possible Reasons for Event: Inadequate meal intake  Comments/MD notified:Dr. Luan Pulling made aware - no changes at this time    Clarice Pole

## 2016-06-30 NOTE — Progress Notes (Signed)
Patient discharged to [facility].  Patient aware of plan and agreeable.  IV removed - WNL.  Condom cath left in place.  Patient in NAD.  Report called and given to [facility] at facility. All questions answered.  Transported via rolling chair.

## 2016-07-01 ENCOUNTER — Other Ambulatory Visit: Payer: Self-pay | Admitting: Internal Medicine

## 2016-07-04 ENCOUNTER — Other Ambulatory Visit (HOSPITAL_COMMUNITY)
Admission: RE | Admit: 2016-07-04 | Discharge: 2016-07-04 | Disposition: A | Discharge status: Home / Self Care | Payer: Medicare HMO | Source: Skilled Nursing Facility | Attending: Pulmonary Disease | Admitting: Pulmonary Disease

## 2016-07-04 ENCOUNTER — Ambulatory Visit (HOSPITAL_COMMUNITY): Payer: Medicare HMO

## 2016-07-04 ENCOUNTER — Other Ambulatory Visit (HOSPITAL_COMMUNITY): Payer: Medicare HMO

## 2016-07-04 ENCOUNTER — Ambulatory Visit (HOSPITAL_COMMUNITY): Payer: Medicare HMO | Admitting: Oncology

## 2016-07-04 DIAGNOSIS — A492 Hemophilus influenzae infection, unspecified site: Secondary | ICD-10-CM | POA: Insufficient documentation

## 2016-07-04 LAB — INFLUENZA PANEL BY PCR (TYPE A & B)
INFLAPCR: NEGATIVE
INFLBPCR: NEGATIVE

## 2016-07-08 ENCOUNTER — Ambulatory Visit: Payer: Medicare HMO | Admitting: Internal Medicine

## 2016-07-13 ENCOUNTER — Other Ambulatory Visit (HOSPITAL_COMMUNITY)
Admission: RE | Admit: 2016-07-13 | Discharge: 2016-07-13 | Disposition: A | Discharge status: Home / Self Care | Payer: Medicare HMO | Source: Skilled Nursing Facility | Attending: Pulmonary Disease | Admitting: Pulmonary Disease

## 2016-07-13 DIAGNOSIS — N189 Chronic kidney disease, unspecified: Secondary | ICD-10-CM | POA: Insufficient documentation

## 2016-07-13 LAB — CBC WITH DIFFERENTIAL/PLATELET
BASOS ABS: 0 10*3/uL (ref 0.0–0.1)
BASOS PCT: 0 %
EOS ABS: 0 10*3/uL (ref 0.0–0.7)
EOS PCT: 1 %
HCT: 33.8 % — ABNORMAL LOW (ref 39.0–52.0)
Hemoglobin: 10.6 g/dL — ABNORMAL LOW (ref 13.0–17.0)
LYMPHS PCT: 36 %
Lymphs Abs: 1 10*3/uL (ref 0.7–4.0)
MCH: 27.1 pg (ref 26.0–34.0)
MCHC: 31.4 g/dL (ref 30.0–36.0)
MCV: 86.4 fL (ref 78.0–100.0)
MONO ABS: 0.5 10*3/uL (ref 0.1–1.0)
Monocytes Relative: 16 %
Neutro Abs: 1.3 10*3/uL — ABNORMAL LOW (ref 1.7–7.7)
Neutrophils Relative %: 47 %
PLATELETS: 137 10*3/uL — AB (ref 150–400)
RBC: 3.91 MIL/uL — AB (ref 4.22–5.81)
RDW: 13.9 % (ref 11.5–15.5)
WBC: 2.8 10*3/uL — AB (ref 4.0–10.5)

## 2016-07-13 LAB — COMPREHENSIVE METABOLIC PANEL
ALT: 51 U/L (ref 17–63)
AST: 56 U/L — AB (ref 15–41)
Albumin: 2.5 g/dL — ABNORMAL LOW (ref 3.5–5.0)
Alkaline Phosphatase: 77 U/L (ref 38–126)
BUN: 28 mg/dL — ABNORMAL HIGH (ref 6–20)
CHLORIDE: 106 mmol/L (ref 101–111)
CO2: 31 mmol/L (ref 22–32)
CREATININE: 1.43 mg/dL — AB (ref 0.61–1.24)
Calcium: 9.1 mg/dL (ref 8.9–10.3)
GFR calc Af Amer: 58 mL/min — ABNORMAL LOW (ref >60)
GFR, EST NON AFRICAN AMERICAN: 50 mL/min — AB (ref >60)
GLUCOSE: 116 mg/dL — AB (ref 65–99)
Potassium: 4.7 mmol/L (ref 3.5–5.1)
SODIUM: 139 mmol/L (ref 135–145)
Total Bilirubin: 0.5 mg/dL (ref 0.3–1.2)
Total Protein: 7.2 g/dL (ref 6.5–8.1)

## 2016-07-14 ENCOUNTER — Telehealth (HOSPITAL_COMMUNITY): Payer: Self-pay

## 2016-07-14 NOTE — Telephone Encounter (Signed)
Called patients caregiver and notified him he had appointment on 2/19 for labs, MD visit and Aranesp. He verbalized understanding.

## 2016-07-14 NOTE — Telephone Encounter (Signed)
-----   Message from Epifanio Lesches sent at 07/13/2016  1:15 PM EST ----- Contact: [phone] Call pts caregiver Jeronimo Greaves. Pt is in rehab at [facility] and needs his labs and aranesp.

## 2016-07-17 NOTE — Progress Notes (Signed)
[place], [place] [address]   CLINIC:  Medical Oncology/Hematology  PCP:  [name], MD [place] [address] [phone]   REASON FOR VISIT:  Follow-up for anemia in setting of CKD  CURRENT THERAPY: Aranesp 50 mcg every 2 weeks   HISTORY OF PRESENT ILLNESS:  (From [name], PA-C's last note on 04/11/16)     INTERVAL HISTORY:  Mr. Kappes returns for follow-up for anemia, in the setting of Stage III-IV chronic kidney disease.   Since his last visit to the cancer center he has had several hospital visits. From 06/03/16-06/08/16, he was admitted for shortness of breath, cough, and congestion; he was found to have HCAP.  From 06/26/16-06/30/16, Mr. Dumlao was admitted for altered mental status secondary to "new stroke." He was discharged to skilled nursing facility at [facility], where he currently resides for rehabilitation.  (Of note, prior to this last admission, he was living at an assisted living facility).   Patient also with history of CHF, with EF 15-20% and history of non-sustained V-tach with placement of AICD.   He is seen today unaccompanied in exam room seated in wheelchair.  He is largely without complaints today. Reports that his energy levels and appetite are good. He tells me he has been eating well; he has been working with physical therapy at [facility] and feels like he is getting stronger.     Note: Patient is slow to respond to questions; his recollection of events makes taking a reliable history a bit difficult.    REVIEW OF SYSTEMS:  Review of Systems  Constitutional: Negative for chills and fever.  HENT:  Negative.   Respiratory: Negative.  Negative for cough and shortness of breath.   Cardiovascular: Negative.  Negative for chest pain and leg swelling.  Gastrointestinal: Negative.  Negative for abdominal pain, blood in stool, constipation, diarrhea, nausea and vomiting.    Endocrine: Negative.   Genitourinary: Negative.  Negative for dysuria and hematuria.   Skin: Negative.  Negative for rash.  Hematological: Negative.   Psychiatric/Behavioral: Negative.      PAST MEDICAL/SURGICAL HISTORY:  Past Medical History:  Diagnosis Date  . AICD (automatic cardioverter/defibrillator) present   . Alcoholism in remission ([place])   . Anemia   . Aortic insufficiency   . Arthritis   . At moderate risk for fall   . Bell's palsy   . Cardiomyopathy ([place])   . Chronic systolic heart failure (HCC)    NYHA Class III  . CKD (chronic kidney disease) stage 3, GFR 30-59 ml/min   . COPD (chronic obstructive pulmonary disease) ([place])   . Essential hypertension, benign   . Hyperlipidemia   . Noncompliance with medication regimen   . Nonischemic cardiomyopathy (HCC)    EF 15-20%  . NSVT (nonsustained ventricular tachycardia) ([place])   . Numbness of right jaw    Had a stoke in 01/2013. Numbness is occasional, especially when trying to chew.  . Productive cough 08/2013   With brown sputum   . SOB (shortness of breath) on exertion 08/2013  . Stroke ([place]) 01/2013   weakness of right side from CVA  . Type 2 diabetes mellitus ([place])   . Uses hearing aid 2014   recently received new hearing aids   Past Surgical History:  Procedure Laterality Date  . BIOPSY N/A 04/10/2014   Procedure: GASTRIC BIOPSY;  Surgeon: Daneil Dolin, MD;  Location: [facility] ORS;  Service:  Endoscopy;  Laterality: N/A;  . BIOPSY  10/12/2015   Procedure: BIOPSY;  Surgeon: Daneil Dolin, MD;  Location: [facility] ENDO SUITE;  Service: Endoscopy;;  stomach bx's  . CARDIAC DEFIBRILLATOR PLACEMENT  11/12/12   [employer] Inogen MINI ICD implanted in [place] at [place] [facility] per Dr Nelly Laurence note  . COLONOSCOPY WITH PROPOFOL N/A 04/10/2014   RMR: Colonic Diverticulosis  . ESOPHAGOGASTRODUODENOSCOPY (EGD) WITH PROPOFOL N/A 04/10/2014   RMR: Mild erosive reflux  esophagitis. Multiple antral polyps likely hyperplastic status post removal by hot snare cautery technique. Diffusely abnormal stomach status post gastric biopsy I suspect some of patients anemaia may be due to intermittent oozing from the stomach. It would be difficult and a risky proposition to attempt complete removal of all of his gastric polyps.  . ESOPHAGOGASTRODUODENOSCOPY (EGD) WITH PROPOFOL N/A 10/12/2015   Procedure: ESOPHAGOGASTRODUODENOSCOPY (EGD) WITH PROPOFOL;  Surgeon: Daneil Dolin, MD;  Location: [facility] ENDO SU[facility];  Service: Endoscopy;  Laterality: N/A;  9417 - moved to 9:15  . POLYPECTOMY N/A 04/10/2014   Procedure: GASTRIC POLYPECTOMY;  Surgeon: Daneil Dolin, MD;  Location: [facility] ORS;  Service: Endoscopy;  Laterality: N/A;  . RIGHT HEART CATHETERIZATION N/A 02/06/2014   Procedure: RIGHT HEART CATH;  Surgeon: Larey Dresser, MD;  Location: [facility] CATH LAB;  Service: Cardiovascular;  Laterality: N/A;     SOCIAL HISTORY:  Social History   Social History  . Marital status: Single    Spouse name: N/A  . Number of children: N/A  . Years of education: N/A   Occupational History  . disabled    Social History Main Topics  . Smoking status: Former Smoker    Types: Cigarettes    Quit date: 11/05/1993  . Smokeless tobacco: Never Used  . Alcohol use Yes     Comment: former heavy ETOH, none recently  . Drug use: No     Comment: prior history of cocaine use, smoking   . Sexual activity: Yes    Birth control/ protection: None   Other Topics Concern  . Not on file   Social History Narrative   Recently moved from [place] to be with family in [place] (2015).  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  Has h/o polysubstance abuse.  Baseline - able to ambulate, mild cognitive delay, able to toilet and shower himself, occasional use of walker/cane.                FAMILY HISTORY:  Family History  Problem Relation Age of Onset  . Kidney disease Mother   . Kidney  disease Sister   . Colon cancer Neg Hx     CURRENT MEDICATIONS:  Outpatient Encounter Prescriptions as of 07/18/2016  Medication Sig  . acetaminophen (TYLENOL) 325 MG tablet Take 2 tablets (650 mg total) by mouth every 4 (four) hours as needed for mild pain (or temp > 37.5 C (99.5 F)).  Marland Kitchen aspirin 325 MG tablet Take 1 tablet (325 mg total) by mouth daily.  Marland Kitchen atorvastatin (LIPITOR) 80 MG tablet Take 80 mg by mouth daily.   . carvedilol (COREG) 25 MG tablet Take 12.5 mg by mouth 2 (two) times daily with a meal.   . Cholecalciferol (VITAMIN D3) 2000 UNITS TABS Take 2,000 mg by mouth daily.  . digoxin (LANOXIN) 0.125 MG tablet TAKE 1/2 TABLET BY MOUTH DAILY  . feeding supplement, ENSURE ENLIVE, (ENSURE ENLIVE) LIQD Take 237  mLs by mouth 2 (two) times daily between meals.  . ferrous sulfate 325 (65 FE) MG tablet Take 325 mg by mouth daily with breakfast.  . furosemide (LASIX) 40 MG tablet Take 40 mg by mouth daily.  . isosorbide dinitrate (ISORDIL) 20 MG tablet Take 40 mg by mouth 3 (three) times daily.   Marland Kitchen losartan (COZAAR) 25 MG tablet Take 25 mg by mouth daily.  . mirabegron ER (MYRBETRIQ) 25 MG TB24 tablet Take 25 mg by mouth daily.  . mirtazapine (REMERON) 7.5 MG tablet Take 1 tablet by mouth daily.  . Multiple Vitamin (MULTIVITAMIN WITH MINERALS) TABS tablet Take 1 tablet by mouth daily.  . pantoprazole (PROTONIX) 40 MG tablet TAKE ONE TABLET   BY MOUTH   DAILY  . fluconazole (DIFLUCAN) 100 MG tablet Take 1 tablet (100 mg total) by mouth daily.   No facility-administered encounter medications on file as of 07/18/2016.     ALLERGIES:  No Known Allergies   PHYSICAL EXAM:  ECOG Performance status: 3 - Requires considerable assistance    Vitals:   07/18/16 1122 07/18/16 1145  BP: (!) 95/55   Pulse: 85   Resp: 16   Temp:  99.1 F (37.3 C)   Filed Weights   07/18/16 1122  Weight: 112 lb (50.8 kg)    Physical Exam  Constitutional: He is oriented to person, place, and time. No  distress.  Thin, frail male in no distress   HENT:  Head: Normocephalic.  Mouth/Throat: Oropharyngeal exudate and posterior oropharyngeal edema present.    Eyes: Conjunctivae are normal. Pupils are equal, round, and reactive to light. No scleral icterus.  Neck: Normal range of motion. Neck supple.  Cardiovascular: Normal rate and regular rhythm.   Murmur heard. Pacemaker in place   Pulmonary/Chest: Effort normal and breath sounds normal. No respiratory distress. He has no wheezes. He has no rales.  Abdominal: Soft. Bowel sounds are normal. There is no tenderness.  Musculoskeletal: He exhibits no edema.  Lymphadenopathy:    He has no cervical adenopathy.  Neurological: He is alert and oriented to person, place, and time.  Mild cognitive delay, particularly when answering questions   Skin: Skin is warm and dry. No rash noted.  Skin very warm; low-grade temperature recorded 99.1.   Psychiatric: Mood and affect normal.     LABORATORY DATA:  I have reviewed the labs as listed.  CBC    Component Value Date/Time   WBC 2.4 (L) 07/18/2016 1038   RBC 3.54 (L) 07/18/2016 1038   HGB 9.6 (L) 07/18/2016 1038   HCT 31.4 (L) 07/18/2016 1038   PLT 110 (L) 07/18/2016 1038   MCV 88.7 07/18/2016 1038   MCH 27.1 07/18/2016 1038   MCHC 30.6 07/18/2016 1038   RDW 13.6 07/18/2016 1038   LYMPHSABS 1.0 07/13/2016 1050   MONOABS 0.5 07/13/2016 1050   EOSABS 0.0 07/13/2016 1050   BASOSABS 0.0 07/13/2016 1050   CMP Latest Ref Rng & Units 07/13/2016 06/26/2016 06/08/2016  Glucose 65 - 99 mg/dL 116(H) 126(H) 79  BUN 6 - 20 mg/dL 28(H) 38(H) 25(H)  Creatinine 0.61 - 1.24 mg/dL 1.43(H) 2.05(H) 1.62(H)  Sodium 135 - 145 mmol/L 139 133(L) 135  Potassium 3.5 - 5.1 mmol/L 4.7 4.2 3.7  Chloride 101 - 111 mmol/L 106 105 106  CO2 22 - 32 mmol/L 31 24 26   Calcium 8.9 - 10.3 mg/dL 9.1 8.7(L) 8.5(L)  Total Protein 6.5 - 8.1 g/dL 7.2 7.1 -  Total Bilirubin 0.3 - 1.2  mg/dL 0.5 0.4 -  Alkaline Phos 38 - 126  U/L 77 70 -  AST 15 - 41 U/L 56(H) 54(H) -  ALT 17 - 63 U/L 51 42 -    PENDING LABS:    DIAGNOSTIC IMAGING:  Recent CT head: 06/26/16   PATHOLOGY:  Last EGD (Dr. Gala Romney): 10/12/15      ASSESSMENT & PLAN:   Anemia due to CKD, iron deficiency, and anemia of chronic disease:  -Labs reviewed. Hemoglobin 9.6 today; Aranesp 50 mcg given today.  -Return every 2 weeks for CBC with diff and consideration of Aranesp injection; goal hemoglobin >11.  -Return to cancer center in 3 months for routine follow-up.   Pancytopenia:  -WBCs remain low at 2.4; Hemoglobin 9.6, & Platelets 110,000.  -Pancytopenia has been chronic for 2+ years; we will collect viral panel (hepatitis B, hepatitis C, & HIV) when he comes back in 2 weeks for labs.  After reviewing with Dr. Talbert Cage, these historical findings are concerning for possible myelodysplastic syndrome.   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  -Prescription for Diflucan 100 mg po daily x 14 days given to patient today.  -Clinical update provided in writing to patient's SNF regarding this prescription and recommendations.     Debilitated state s/p CVA:  -Continue rehab at skilled nursing facility, as directed.   Protein-calorie malnutrition:  -Weights from SNF reviewed by LPN; most recent weight 112 lbs about 1 week ago. This is suggestive of 20+ lb weight loss in past 3 months, which is very concerning.   -I will consult Burtis Junes, RD for assistance and recommendations.       Dispo:  -Labs every 2 weeks and consideration for Aranesp.  -Return to cancer center for follow-up in 3 months.     All questions were answered to patient's stated satisfaction. Encouraged patient to call with any new concerns or questions before his next visit to the cancer center and we can certain see him sooner, if needed.     Plan of care discussed with Dr. Talbert Cage, who agrees with the above  aforementioned.    Mike Craze, NP [place] [phone]

## 2016-07-17 NOTE — Consult Note (Signed)
Keith Young MRN: [id number] DOB/AGE: 06-02-1950 66 y.o. Primary Care Physician:Zamarian Scarano L, MD Admit date: 06/30/2016 Chief Complaint: For rehabilitation HPI: This is documentation of history and physical I did at the skilled care facility on 07/16/2016. He is at the skilled care facility for rehabilitation. He has at baseline heart failure with ejection fraction 15-20% history of nonsustained ventricular tachycardia AICD placement, chronic systolic heart failure chronic kidney disease stage III to 4 COPD, hypertension, history of alcoholism in remission History of stroke with residual effects anxiety and depression and diabetes. He has been working with physical therapy and is making progress. He has no complaints today. No nausea vomiting diarrhea chest pain abdominal pain dysuria Past Medical History:  Diagnosis Date  . AICD (automatic cardioverter/defibrillator) present   . Alcoholism in remission ([place])   . Anemia   . Aortic insufficiency   . Arthritis   . At moderate risk for fall   . Bell's palsy   . Cardiomyopathy ([place])   . Chronic systolic heart failure (HCC)    NYHA Class III  . CKD (chronic kidney disease) stage 3, GFR 30-59 ml/min   . COPD (chronic obstructive pulmonary disease) ([place])   . Essential hypertension, benign   . Hyperlipidemia   . Noncompliance with medication regimen   . Nonischemic cardiomyopathy (HCC)    EF 15-20%  . NSVT (nonsustained ventricular tachycardia) ([place])   . Numbness of right jaw    Had a stoke in 01/2013. Numbness is occasional, especially when trying to chew.  . Productive cough 08/2013   With brown sputum   . SOB (shortness of breath) on exertion 08/2013  . Stroke ([place]) 01/2013   weakness of right side from CVA  . Type 2 diabetes mellitus ([place])   . Uses hearing aid 2014   recently received new hearing aids   Past Surgical History:  Procedure Laterality Date  . BIOPSY N/A 04/10/2014   Procedure: GASTRIC BIOPSY;  Surgeon: Daneil Dolin, MD;  Location: [facility] ORS;  Service: Endoscopy;  Laterality: N/A;  . BIOPSY  10/12/2015   Procedure: BIOPSY;  Surgeon: Daneil Dolin, MD;  Location: [facility] ENDO SU[facility];  Service: Endoscopy;;  stomach bx's  . CARDIAC DEFIBRILLATOR PLACEMENT  11/12/12   [employer] Inogen MINI ICD implanted in [place] at [facility] per Dr Nelly Laurence note  . COLONOSCOPY WITH PROPOFOL N/A 04/10/2014   RMR: Colonic Diverticulosis  . ESOPHAGOGASTRODUODENOSCOPY (EGD) WITH PROPOFOL N/A 04/10/2014   RMR: Mild erosive reflux esophagitis. Multiple antral polyps likely hyperplastic status post removal by hot snare cautery technique. Diffusely abnormal stomach status post gastric biopsy I suspect some of patients anemaia may be due to intermittent oozing from the stomach. It would be difficult and a risky proposition to attempt complete removal of all of his gastric polyps.  . ESOPHAGOGASTRODUODENOSCOPY (EGD) WITH PROPOFOL N/A 10/12/2015   Procedure: ESOPHAGOGASTRODUODENOSCOPY (EGD) WITH PROPOFOL;  Surgeon: Daneil Dolin, MD;  [facility];  Service: Endoscopy;  Laterality: N/A;  5170 - moved to 9:15  . POLYPECTOMY N/A 04/10/2014   Procedure: GASTRIC POLYPECTOMY;  Surgeon: Daneil Dolin, MD;  Location: [facility] ORS;  Service: Endoscopy;  Laterality: N/A;  . RIGHT HEART CATHETERIZATION N/A 02/06/2014   Procedure: RIGHT HEART CATH;  Surgeon: Larey Dresser, MD;  Location: [facility] CATH LAB;  Service: Cardiovascular;  Laterality: N/A;        Family History  Problem Relation Age of Onset  .  Kidney disease Mother   . Kidney disease Sister   . Colon cancer Neg Hx     Social History:  reports that he quit smoking about 22 years ago. His smoking use included Cigarettes. He has never used smokeless tobacco. He reports that he drinks alcohol. He reports that he does not use drugs.   Allergies: No Known Allergies  Medications Prior to Admission  Medication Sig  Dispense Refill  . acetaminophen (TYLENOL) 325 MG tablet Take 2 tablets (650 mg total) by mouth every 4 (four) hours as needed for mild pain (or temp > 37.5 C (99.5 F)).    Marland Kitchen aspirin 325 MG tablet Take 1 tablet (325 mg total) by mouth daily.    Marland Kitchen atorvastatin (LIPITOR) 80 MG tablet Take 80 mg by mouth daily.     . carvedilol (COREG) 25 MG tablet Take 12.5 mg by mouth 2 (two) times daily with a meal.     . Cholecalciferol (VITAMIN D3) 2000 UNITS TABS Take 2,000 mg by mouth daily.    . feeding supplement, ENSURE ENLIVE, (ENSURE ENLIVE) LIQD Take 237 mLs by mouth 2 (two) times daily between meals. 237 mL 12  . ferrous sulfate 325 (65 FE) MG tablet Take 325 mg by mouth daily with breakfast.    . furosemide (LASIX) 40 MG tablet Take 40 mg by mouth daily.    . isosorbide dinitrate (ISORDIL) 20 MG tablet Take 40 mg by mouth 3 (three) times daily.     Marland Kitchen losartan (COZAAR) 25 MG tablet Take 25 mg by mouth daily.    . mirabegron ER (MYRBETRIQ) 25 MG TB24 tablet Take 25 mg by mouth daily.    . mirtazapine (REMERON) 7.5 MG tablet Take 1 tablet by mouth daily.    . Multiple Vitamin (MULTIVITAMIN WITH MINERALS) TABS tablet Take 1 tablet by mouth daily.    . pantoprazole (PROTONIX) 40 MG tablet TAKE ONE TABLET   BY MOUTH   DAILY 30 tablet 11       KPT:WSFKC from the symptoms mentioned above,there are no other symptoms referable to all systems reviewed.  Physical Exam: There were no vitals taken for this visit. Constitutional: He is awake and alert. No acute distress. Eyes: Pupils react EOMI. Ears nose mouth and throat: His mucous membranes are moist. He is mildly hard of hearing. Cardiovascular: His heart is regular with systolic heart murmur. No edema. Respiratory: His respiratory effort is normal. His lungs are clear. Gastrointestinal: His abdomen is soft with no masses. Skin: Warm and dry. Musculoskeletal: He is still weak in the lower extremities. Neurological: He has some speech abnormality from his  previous stroke. Psychiatric: Normal mood and affect   No results for input(s): WBC, NEUTROABS, HGB, HCT, MCV, PLT in the last 72 hours. No results for input(s): NA, K, CL, CO2, GLUCOSE, BUN, CREATININE, CALCIUM, MG in the last 72 hours.  Invalid input(s): PHOlablast2(ast:2,ALT:2,alkphos:2,bilitot:2,prot:2,albumin:2)@    No results found for this or any previous visit (from the past 240 hour(s)).   Dg Chest 2 View  Result Date: 06/26/2016 CLINICAL DATA:  Patient with altered mental status. EXAM: CHEST  2 VIEW COMPARISON:  A chest radiograph 06/03/2016. FINDINGS: Single lead AICD device overlies the left hemithorax. Stable cardiomegaly status post median sternotomy. No consolidative pulmonary opacities. No pleural effusion or pneumothorax. Regional skeleton is unremarkable. IMPRESSION: No active cardiopulmonary disease. Electronically Signed   By: Lovey Newcomer M.D.   On: 06/26/2016 16:18   Ct Head Wo Contrast  Result Date: 06/26/2016  CLINICAL DATA:  66 year old male with altered mental status. EXAM: CT HEAD WITHOUT CONTRAST TECHNIQUE: Contiguous axial images were obtained from the base of the skull through the vertex without intravenous contrast. COMPARISON:  09/22/2015 and 04/28/2015 head CTs FINDINGS: Brain: A left cerebellar infarct is noted, age indeterminate but new since 09/22/2015. There is no evidence of hemorrhage. Atrophy, severe chronic small-vessel white matter ischemic changes and remote infarcts within the right cerebellum, pons, basal ganglia, thalami and right parietal regions again noted. Vascular: Intracranial atherosclerotic calcifications noted. Skull: Normal. Negative for fracture or focal lesion. Sinuses/Orbits: No acute finding. Other: None. IMPRESSION: Age indeterminate left cerebellar infarct without hemorrhage, new since 09/22/2015. Atrophy, chronic small-vessel white matter ischemic changes and remote infarcts as described. Electronically Signed   By: Margarette Canada M.D.   On:  06/26/2016 16:26   US Carotid Bilateral (at [facility] Only)  Result Date: 06/27/2016 CLINICAL DATA:  TIA. EXAM: BILATERAL CAROTID DUPLEX ULTRASOUND TECHNIQUE: Pearline Cables scale imaging, color Doppler and duplex ultrasound were performed of bilateral carotid and vertebral arteries in the neck. COMPARISON:  CT 06/26/2016.  Ultrasound 12/25/2013. FINDINGS: Criteria: Quantification of carotid stenosis is based on velocity parameters that correlate the residual internal carotid diameter with NASCET-based stenosis levels, using the diameter of the distal internal carotid lumen as the denominator for stenosis measurement. The following velocity measurements were obtained: RIGHT ICA:  56/14 cm/sec CCA:  36/62 cm/sec SYSTOLIC ICA/CCA RATIO:  0.9 DIASTOLIC ICA/CCA RATIO:  1.1 ECA:  69 cm/sec LEFT ICA:  52/15 cm/sec CCA:  94/76 cm/sec SYSTOLIC ICA/CCA RATIO:  0.6 DIASTOLIC ICA/CCA RATIO:  1.3 ECA:  73 cm/sec RIGHT CAROTID ARTERY: Right carotid bifurcation and proximal ICA atherosclerotic vascular disease. No flow limiting stenosis. Degree of stenosis less than 50%. Similar finding noted on prior exam. Scratched RIGHT VERTEBRAL ARTERY:  Patent with antegrade flow. LEFT CAROTID ARTERY: Left carotid bifurcation and proximal ICA atherosclerotic vascular disease. No flow limiting stenosis. Degree of stenosis less 50%. Similar findings noted on prior exam. LEFT VERTEBRAL ARTERY:  Patent with antegrade flow. IMPRESSION: One Mild bilateral carotid bifurcation and proximal ICA atherosclerotic vascular disease. Degree of stenosis less than 50% bilaterally. Similar findings noted on prior exam. 2. Vertebral arteries are patent with antegrade flow . Electronically Signed   By: Marcello Moores  Register   On: 06/27/2016 11:09   Impression: He has multiple medical problems and is in the skilled care facility for rehabilitation which is ongoing. He says he feels better. I'm not sure he is going to be able to return to an assisted living facility  which is where he was prior to his last hospitalization. His heart failure is well controlled. COPD seems to be doing okay. His blood sugars have been well managed. Active Problems:   * No active hospital problems. *     Plan: Continue rehabilitation      Dacie Mandel L   07/17/2016, 9:27 AM

## 2016-07-18 ENCOUNTER — Encounter (HOSPITAL_COMMUNITY): Payer: Medicare HMO | Attending: Adult Health | Admitting: Adult Health

## 2016-07-18 ENCOUNTER — Encounter (HOSPITAL_BASED_OUTPATIENT_CLINIC_OR_DEPARTMENT_OTHER): Payer: Medicare HMO

## 2016-07-18 ENCOUNTER — Encounter (HOSPITAL_COMMUNITY): Payer: Self-pay | Admitting: Adult Health

## 2016-07-18 ENCOUNTER — Encounter (HOSPITAL_COMMUNITY): Payer: Medicare HMO

## 2016-07-18 VITALS — Temp 98.6°F

## 2016-07-18 VITALS — BP 95/55 | HR 85 | Temp 99.1°F | Resp 16 | Ht 63.0 in | Wt 112.0 lb

## 2016-07-18 DIAGNOSIS — E46 Unspecified protein-calorie malnutrition: Secondary | ICD-10-CM

## 2016-07-18 DIAGNOSIS — D631 Anemia in chronic kidney disease: Secondary | ICD-10-CM

## 2016-07-18 DIAGNOSIS — N183 Chronic kidney disease, stage 3 unspecified: Secondary | ICD-10-CM

## 2016-07-18 DIAGNOSIS — Z7982 Long term (current) use of aspirin: Secondary | ICD-10-CM | POA: Diagnosis not present

## 2016-07-18 DIAGNOSIS — R634 Abnormal weight loss: Secondary | ICD-10-CM

## 2016-07-18 DIAGNOSIS — D61818 Other pancytopenia: Secondary | ICD-10-CM | POA: Insufficient documentation

## 2016-07-18 DIAGNOSIS — Z87891 Personal history of nicotine dependence: Secondary | ICD-10-CM | POA: Diagnosis not present

## 2016-07-18 DIAGNOSIS — B370 Candidal stomatitis: Secondary | ICD-10-CM | POA: Diagnosis not present

## 2016-07-18 DIAGNOSIS — Z79899 Other long term (current) drug therapy: Secondary | ICD-10-CM | POA: Insufficient documentation

## 2016-07-18 DIAGNOSIS — D509 Iron deficiency anemia, unspecified: Secondary | ICD-10-CM | POA: Diagnosis not present

## 2016-07-18 LAB — CBC
HCT: 31.4 % — ABNORMAL LOW (ref 39.0–52.0)
HEMOGLOBIN: 9.6 g/dL — AB (ref 13.0–17.0)
MCH: 27.1 pg (ref 26.0–34.0)
MCHC: 30.6 g/dL (ref 30.0–36.0)
MCV: 88.7 fL (ref 78.0–100.0)
Platelets: 110 10*3/uL — ABNORMAL LOW (ref 150–400)
RBC: 3.54 MIL/uL — AB (ref 4.22–5.81)
RDW: 13.6 % (ref 11.5–15.5)
WBC: 2.4 10*3/uL — ABNORMAL LOW (ref 4.0–10.5)

## 2016-07-18 MED ORDER — DARBEPOETIN ALFA 60 MCG/0.3ML IJ SOSY
PREFILLED_SYRINGE | INTRAMUSCULAR | Status: AC
Start: 2016-07-18 — End: ?
  Filled 2016-07-18: qty 0.3

## 2016-07-18 MED ORDER — FLUCONAZOLE 100 MG PO TABS: 100.0000 mg | ORAL_TABLET | Freq: Every day | ORAL | 0 refills | Status: AC

## 2016-07-18 MED ORDER — DARBEPOETIN ALFA 60 MCG/0.3ML IJ SOSY
50.0000 ug | PREFILLED_SYRINGE | Freq: Once | INTRAMUSCULAR | Status: AC
  Administered 2016-07-18: 50 ug via SUBCUTANEOUS

## 2016-07-18 NOTE — Progress Notes (Signed)
Keith Young presents today for injection per MD orders. Aranesp 32mcg administered SQ in left Abdomen. Administration without incident. Patient tolerated well.

## 2016-07-18 NOTE — Patient Instructions (Signed)
[facility] at [facility] Discharge Instructions  RECOMMENDATIONS MADE BY THE CONSULTANT AND ANY TEST RESULTS WILL BE SENT TO YOUR REFERRING PHYSICIAN.  Aranesp given today  Follow up as scheduled.  Thank you for choosing [facility] at [facility] to provide your oncology and hematology care.  To afford each patient quality time with our provider, please arrive at least 15 minutes before your scheduled appointment time.    If you have a lab appointment with the Cancer Center please come in thru the  Main Entrance and check in at the main information desk  You need to re-schedule your appointment should you arrive 10 or more minutes late.  We strive to give you quality time with our providers, and arriving late affects you and other patients whose appointments are after yours.  Also, if you no show three or more times for appointments you may be dismissed from the clinic at the providers discretion.     Again, thank you for choosing [facility].  Our hope is that these requests will decrease the amount of time that you wait before being seen by our physicians.       _____________________________________________________________  Should you have questions after your visit to [facility], please contact our office at [phone] between the hours of 8:30 a.m. and 4:30 p.m.  Voicemails left after 4:30 p.m. will not be returned until the following business day.  For prescription refill requests, have your pharmacy contact our office.       Resources For Cancer Patients and their Caregivers ? American Cancer Society: Can assist with transportation, wigs, general needs, runs Look Good Feel Better.        1-888-227-6333 ? Cancer Care: Provides financial assistance, online support groups, medication/co-pay assistance.  1-800-813-HOPE (4673) ? [facility] Assists [place] cancer patients and  their families through emotional , educational and financial support.  336-427-4357 ? [place] DSS Where to apply for food stamps, Medicaid and utility assistance. 336-342-1394 ? RCATS: Transportation to medical appointments. 336-347-2287 ? Social Security Administration: May apply for disability if have a Stage IV cancer. 336-342-7796 1-800-772-1213 ? [place] Aging, Disability and Transit Services: Assists with nutrition, care and transit needs. 336-349-2343  Cancer Center Support Programs: @10RELATIVEDAYS@ > Cancer Support Group  2nd Tuesday of the month 1pm-2pm, Journey Room  > Creative Journey  3rd Tuesday of the month 1130am-1pm, Journey Room  > Look Good Feel Better  1st Wednesday of the month 10am-12 noon, Journey Room (Call American Cancer [employer] to register 1-800-395-5775)   

## 2016-07-18 NOTE — Patient Instructions (Signed)
[place] at [facility] Discharge Instructions  RECOMMENDATIONS MADE BY THE CONSULTANT AND ANY TEST RESULTS WILL BE SENT TO YOUR REFERRING PHYSICIAN.  You were seen today by Mike Craze NP. Rx given for Diflucan. Return every 2 weeks for labs and Aranesp injection. REturn in 3 months for labs and follow up visit.   Thank you for choosing [place] at [facility] to provide your oncology and hematology care.  To afford each patient quality time with our provider, please arrive at least 15 minutes before your scheduled appointment time.    If you have a lab appointment with the [place] please come in thru the  Main Entrance and check in at the main information desk  You need to re-schedule your appointment should you arrive 10 or more minutes late.  We strive to give you quality time with our providers, and arriving late affects you and other patients whose appointments are after yours.  Also, if you no show three or more times for appointments you may be dismissed from the clinic at the providers discretion.     Again, thank you for choosing [facility].  Our hope is that these requests will decrease the amount of time that you wait before being seen by our physicians.       _____________________________________________________________  Should you have questions after your visit to [facility], please contact our office at [phone] between the hours of 8:30 a.m. and 4:30 p.m.  Voicemails left after 4:30 p.m. will not be returned until the following business day.  For prescription refill requests, have your pharmacy contact our office.       Resources For Cancer Patients and their Caregivers ? American Cancer Society: Can assist with transportation, wigs, general needs, runs Look Good Feel Better.        520-025-9401 ? Cancer Care: Provides financial assistance, online support groups,  medication/co-pay assistance.  1-800-813-HOPE 8507699547) ? [place] Assists [place] cancer patients and their families through emotional , educational and financial support.  (438)509-2110 ? [place] DSS Where to apply for food stamps, Medicaid and utility assistance. 903-756-7950 ? RCATS: Transportation to medical appointments. 708-274-7202 ? Social Security Administration: May apply for disability if have a Stage IV cancer. 203-259-8279 407-675-6994 ? [employer], Disability and Transit Services: Assists with nutrition, care and transit needs. [place] Support Programs: @10RELATIVEDAYS @ > Cancer Support Group  2nd Tuesday of the month 1pm-2pm, Journey Room  > Creative Journey  3rd Tuesday of the month 1130am-1pm, Journey Room  > Look Good Feel Better  1st Wednesday of the month 10am-12 noon, Journey Room (Call [place] to register [phone])

## 2016-07-18 NOTE — Addendum Note (Signed)
Addended by: Holley Bouche on: 07/18/2016 05:06 PM   Modules accepted: Orders

## 2016-07-21 ENCOUNTER — Emergency Department (HOSPITAL_COMMUNITY): Payer: Medicare HMO

## 2016-07-21 ENCOUNTER — Emergency Department (HOSPITAL_COMMUNITY)
Admission: EM | Admit: 2016-07-21 | Discharge: 2016-07-21 | Disposition: A | Discharge status: Home / Self Care | Payer: Medicare HMO | Attending: Emergency Medicine | Admitting: Emergency Medicine

## 2016-07-21 ENCOUNTER — Other Ambulatory Visit: Payer: Self-pay

## 2016-07-21 ENCOUNTER — Encounter (HOSPITAL_COMMUNITY): Payer: Self-pay | Admitting: Emergency Medicine

## 2016-07-21 ENCOUNTER — Ambulatory Visit (INDEPENDENT_AMBULATORY_CARE_PROVIDER_SITE_OTHER): Payer: Medicare HMO | Admitting: Otolaryngology

## 2016-07-21 DIAGNOSIS — R4182 Altered mental status, unspecified: Secondary | ICD-10-CM | POA: Diagnosis present

## 2016-07-21 DIAGNOSIS — Z87891 Personal history of nicotine dependence: Secondary | ICD-10-CM | POA: Insufficient documentation

## 2016-07-21 DIAGNOSIS — Z7982 Long term (current) use of aspirin: Secondary | ICD-10-CM | POA: Insufficient documentation

## 2016-07-21 DIAGNOSIS — I130 Hypertensive heart and chronic kidney disease with heart failure and stage 1 through stage 4 chronic kidney disease, or unspecified chronic kidney disease: Secondary | ICD-10-CM | POA: Insufficient documentation

## 2016-07-21 DIAGNOSIS — I2510 Atherosclerotic heart disease of native coronary artery without angina pectoris: Secondary | ICD-10-CM | POA: Insufficient documentation

## 2016-07-21 DIAGNOSIS — J449 Chronic obstructive pulmonary disease, unspecified: Secondary | ICD-10-CM | POA: Diagnosis not present

## 2016-07-21 DIAGNOSIS — Z79899 Other long term (current) drug therapy: Secondary | ICD-10-CM | POA: Insufficient documentation

## 2016-07-21 DIAGNOSIS — E1122 Type 2 diabetes mellitus with diabetic chronic kidney disease: Secondary | ICD-10-CM | POA: Insufficient documentation

## 2016-07-21 DIAGNOSIS — N183 Chronic kidney disease, stage 3 (moderate): Secondary | ICD-10-CM | POA: Insufficient documentation

## 2016-07-21 DIAGNOSIS — I5023 Acute on chronic systolic (congestive) heart failure: Secondary | ICD-10-CM | POA: Insufficient documentation

## 2016-07-21 LAB — CBC WITH DIFFERENTIAL/PLATELET
Basophils Absolute: 0 10*3/uL (ref 0.0–0.1)
Basophils Relative: 0 %
EOS ABS: 0 10*3/uL (ref 0.0–0.7)
EOS PCT: 1 %
HCT: 29 % — ABNORMAL LOW (ref 39.0–52.0)
Hemoglobin: 9.1 g/dL — ABNORMAL LOW (ref 13.0–17.0)
LYMPHS ABS: 1.1 10*3/uL (ref 0.7–4.0)
LYMPHS PCT: 39 %
MCH: 27.3 pg (ref 26.0–34.0)
MCHC: 31.4 g/dL (ref 30.0–36.0)
MCV: 87.1 fL (ref 78.0–100.0)
Monocytes Absolute: 0.4 10*3/uL (ref 0.1–1.0)
Monocytes Relative: 15 %
Neutro Abs: 1.2 10*3/uL — ABNORMAL LOW (ref 1.7–7.7)
Neutrophils Relative %: 45 %
PLATELETS: 121 10*3/uL — AB (ref 150–400)
RBC: 3.33 MIL/uL — AB (ref 4.22–5.81)
RDW: 14.1 % (ref 11.5–15.5)
WBC: 2.8 10*3/uL — ABNORMAL LOW (ref 4.0–10.5)

## 2016-07-21 LAB — COMPREHENSIVE METABOLIC PANEL
ALBUMIN: 2.3 g/dL — AB (ref 3.5–5.0)
ALT: 35 U/L (ref 17–63)
AST: 38 U/L (ref 15–41)
Alkaline Phosphatase: 59 U/L (ref 38–126)
Anion gap: 6 (ref 5–15)
BUN: 59 mg/dL — AB (ref 6–20)
CHLORIDE: 109 mmol/L (ref 101–111)
CO2: 25 mmol/L (ref 22–32)
CREATININE: 1.91 mg/dL — AB (ref 0.61–1.24)
Calcium: 8.8 mg/dL — ABNORMAL LOW (ref 8.9–10.3)
GFR calc Af Amer: 41 mL/min — ABNORMAL LOW (ref >60)
GFR calc non Af Amer: 35 mL/min — ABNORMAL LOW (ref >60)
GLUCOSE: 150 mg/dL — AB (ref 65–99)
POTASSIUM: 4 mmol/L (ref 3.5–5.1)
Sodium: 140 mmol/L (ref 135–145)
Total Bilirubin: 0.4 mg/dL (ref 0.3–1.2)
Total Protein: 6.9 g/dL (ref 6.5–8.1)

## 2016-07-21 LAB — I-STAT CG4 LACTIC ACID, ED
LACTIC ACID, VENOUS: 1.1 mmol/L (ref 0.5–1.9)
Lactic Acid, Venous: 2 mmol/L (ref 0.5–1.9)

## 2016-07-21 NOTE — ED Notes (Signed)
Dr Sabra Heck aware nurse unable to pass coude cath to obtain urine specimen. Bladder scanner shows 404 ml of urine

## 2016-07-21 NOTE — ED Notes (Signed)
Per [place] instructed to ensure pt is fed at meals

## 2016-07-21 NOTE — ED Provider Notes (Signed)
[place] DEPT Provider Note   CSN: [id number] Arrival date & time: 07/21/16  0727     History   Chief Complaint Chief Complaint  Patient presents with  . Altered Mental Status    HPI Keith Young is a 66 y.o. male.  HPI  The patient is a 66 year old male, he has a recent complicated medical history in that he was admitted to the hospital after having pneumonia, he was having episodes of unresponsiveness when he was sent back to his assisted care facility and thus was admitted for altered mental status. During that admission he had a CT scan showing a possible new stroke, an EEG which was unremarkable and he was sent to a skilled nursing facility where he has been for the last several weeks. The patient has no complaints, according to the nurses at this facility he had been up most of the night as the nurses were trying to help do something in the room, it is not clear exactly what that was but he did not get good sleep. When he was aroused around 640 this morning he was found to have some difficulty arousing, then he returned to his normal mental status, as the nurse tried to call for the physician order to get the patient transferred to the hospital for this episode of altered mental status the patient had a recurrent episode of altered mental status which again resolved by the time he got to the hospital. These episodes are described as the patient being unresponsive. That being said he had pulses and was breathing spontaneously but was not talking or answering questions. There is no caregiver here to give further information, this is reported over the phone by the nurse at the facility. There has been no fevers, no vomiting, no diarrhea, the patient's baseline is that he is not very talkative and does not initiate conversation. He is quiet and keeps to himself. He seems with a general decline over the last several weeks  He does have a history of card he myopathy requiring pacemaker  placement, alcoholism, stroke and has DO NOT RESUSCITATE orders  Past Medical History:  Diagnosis Date  . AICD (automatic cardioverter/defibrillator) present   . Alcoholism in remission ([place])   . Anemia   . Aortic insufficiency   . Arthritis   . At moderate risk for fall   . Bell's palsy   . Cardiomyopathy ([place])   . Chronic systolic heart failure (HCC)    NYHA Class III  . CKD (chronic kidney disease) stage 3, GFR 30-59 ml/min   . COPD (chronic obstructive pulmonary disease) ([place])   . Essential hypertension, benign   . Hyperlipidemia   . Noncompliance with medication regimen   . Nonischemic cardiomyopathy (HCC)    EF 15-20%  . NSVT (nonsustained ventricular tachycardia) ([place])   . Numbness of right jaw    Had a stoke in 01/2013. Numbness is occasional, especially when trying to chew.  . Productive cough 08/2013   With brown sputum   . SOB (shortness of breath) on exertion 08/2013  . Stroke ([place]) 01/2013   weakness of right side from CVA  . Type 2 diabetes mellitus ([place])   . Uses hearing aid 2014   recently received new hearing aids    Patient Active Problem List   Diagnosis Date Noted  . Malnutrition of moderate degree 06/28/2016  . Altered mental status 06/26/2016  . CVA (cerebral vascular accident) ([place]) 06/26/2016  . Severe malnutrition ([place]) 06/26/2016  .  HCAP (healthcare-associated pneumonia) 06/04/2016  . Mucosal abnormality of stomach   . Gastritis and gastroduodenitis 09/30/2015  . Heme positive stool 09/30/2015  . Pancytopenia ([place]) 09/30/2015  . Chest pain 04/28/2015  . Elevated troponin 04/28/2015  . ICD (implantable cardioverter-defibrillator) in place 01/22/2015  . Anemia 09/30/2014  . Gastric polyp   . Reflux esophagitis   . Elevated LFTs 03/26/2014  . Anemia in chronic kidney disease 03/26/2014  . Carotid bruit 12/18/2013  . Acute on chronic systolic heart failure ([place]) 10/24/2013  . CHF (congestive heart failure), NYHA class III ([place]) 10/23/2013  .  Cough 10/23/2013  . CKD (chronic kidney disease) 10/23/2013  . Diabetes mellitus type 2 in nonobese ([place]) 10/23/2013  . Essential hypertension, benign 10/23/2013  . Hyperkalemia 10/23/2013  . CAD (coronary artery disease) 10/23/2013  . History of stroke 10/23/2013  . Anemia of chronic kidney failure 10/23/2013  . Other and unspecified hyperlipidemia 10/23/2013    Past Surgical History:  Procedure Laterality Date  . BIOPSY N/A 04/10/2014   Procedure: GASTRIC BIOPSY;  Surgeon: Daneil Dolin, MD;  Location: [facility] ORS;  Service: Endoscopy;  Laterality: N/A;  . BIOPSY  10/12/2015   Procedure: BIOPSY;  Surgeon: Daneil Dolin, MD;  Location: [facility] ENDO SUITE;  Service: Endoscopy;;  stomach bx's  . CARDIAC DEFIBRILLATOR PLACEMENT  11/12/12   [employer] Inogen MINI ICD implanted in [place] at [place] [facility] per Dr Nelly Laurence note  . COLONOSCOPY WITH PROPOFOL N/A 04/10/2014   RMR: Colonic Diverticulosis  . ESOPHAGOGASTRODUODENOSCOPY (EGD) WITH PROPOFOL N/A 04/10/2014   RMR: Mild erosive reflux esophagitis. Multiple antral polyps likely hyperplastic status post removal by hot snare cautery technique. Diffusely abnormal stomach status post gastric biopsy I suspect some of patients anemaia may be due to intermittent oozing from the stomach. It would be difficult and a risky proposition to attempt complete removal of all of his gastric polyps.  . ESOPHAGOGASTRODUODENOSCOPY (EGD) WITH PROPOFOL N/A 10/12/2015   Procedure: ESOPHAGOGASTRODUODENOSCOPY (EGD) WITH PROPOFOL;  Surgeon: Daneil Dolin, MD;  Location: [facility] ENDO SUITE;  Service: Endoscopy;  Laterality: N/A;  7902 - moved to 9:15  . POLYPECTOMY N/A 04/10/2014   Procedure: GASTRIC POLYPECTOMY;  Surgeon: Daneil Dolin, MD;  Location: [facility] ORS;  Service: Endoscopy;  Laterality: N/A;  . RIGHT HEART CATHETERIZATION N/A 02/06/2014   Procedure: RIGHT HEART CATH;  Surgeon: Larey Dresser, MD;  Location: [facility] CATH  LAB;  Service: Cardiovascular;  Laterality: N/A;       Home Medications    Prior to Admission medications   Medication Sig Start Date End Date Taking? Authorizing Provider  acetaminophen (TYLENOL) 325 MG tablet Take 2 tablets (650 mg total) by mouth every 4 (four) hours as needed for mild pain (or temp > 37.5 C (99.5 F)). 06/30/16  Yes Sinda Du, MD  aspirin 325 MG tablet Take 1 tablet (325 mg total) by mouth daily. 06/30/16  Yes Sinda Du, MD  atorvastatin (LIPITOR) 80 MG tablet Take 80 mg by mouth daily.    Yes Historical Provider, MD  carvedilol (COREG) 25 MG tablet Take 12.5 mg by mouth 2 (two) times daily with a meal.    Yes Historical Provider, MD  Cholecalciferol (VITAMIN D3) 2000 UNITS TABS Take 2,000 mg by mouth daily.   Yes Historical Provider, MD  digoxin (LANOXIN) 0.125 MG tablet TAKE 1/2 TABLET BY MOUTH DAILY 07/01/16  Yes Jolaine Artist, MD  feeding supplement, ENSURE ENLIVE, (ENSURE  ENLIVE) LIQD Take 237 mLs by mouth 2 (two) times daily between meals. 06/30/16  Yes Sinda Du, MD  ferrous sulfate 325 (65 FE) MG tablet Take 325 mg by mouth daily with breakfast.   Yes Historical Provider, MD  fluconazole (DIFLUCAN) 100 MG tablet Take 1 tablet (100 mg total) by mouth daily. 07/18/16 08/01/16 Yes Holley Bouche, NP  furosemide (LASIX) 40 MG tablet Take 40 mg by mouth daily.   Yes Historical Provider, MD  isosorbide dinitrate (ISORDIL) 20 MG tablet Take 40 mg by mouth 3 (three) times daily.  09/29/15  Yes Historical Provider, MD  losartan (COZAAR) 25 MG tablet Take 25 mg by mouth daily.   Yes Historical Provider, MD  mirabegron ER (MYRBETRIQ) 25 MG TB24 tablet Take 25 mg by mouth daily.   Yes Historical Provider, MD  mirtazapine (REMERON) 7.5 MG tablet Take 1 tablet by mouth daily. 06/09/16  Yes Historical Provider, MD  Multiple Vitamin (MULTIVITAMIN WITH MINERALS) TABS tablet Take 1 tablet by mouth daily. 06/30/16  Yes Sinda Du, MD  pantoprazole (PROTONIX) 40 MG tablet  TAKE ONE TABLET   BY MOUTH   DAILY 05/25/16  Yes Mahala Menghini, PA-C    Family History Family History  Problem Relation Age of Onset  . Kidney disease Mother   . Kidney disease Sister   . Colon cancer Neg Hx     Social History Social History  Substance Use Topics  . Smoking status: Former Smoker    Types: Cigarettes    Quit date: 11/05/1993  . Smokeless tobacco: Never Used  . Alcohol use Yes     Comment: former heavy ETOH, none recently     Allergies   Patient has no known allergies.   Review of Systems Review of Systems  All other systems reviewed and are negative.    Physical Exam Updated Vital Signs BP 141/69   Pulse 74   Temp 98 F (36.7 C) (Oral)   Resp 21   SpO2 100%   Physical Exam  Constitutional:  Muscle wasting, very small frame  HENT:  Head: Normocephalic and atraumatic.  Mouth/Throat: Oropharynx is clear and moist. No oropharyngeal exudate.  Eyes: Conjunctivae and EOM are normal. Pupils are equal, round, and reactive to light. Right eye exhibits no discharge. Left eye exhibits no discharge. No scleral icterus.  Neck: Normal range of motion. Neck supple. No JVD present. No thyromegaly present.  Cardiovascular: Normal rate, regular rhythm and intact distal pulses.  Exam reveals no gallop and no friction rub.   No murmur heard. S3 present  Pulmonary/Chest: Effort normal and breath sounds normal. No respiratory distress. He has no wheezes. He has no rales.  Abdominal: Soft. Bowel sounds are normal. He exhibits no distension and no mass. There is no tenderness.  Musculoskeletal: Normal range of motion. He exhibits no edema or tenderness.  Lymphadenopathy:    He has no cervical adenopathy.  Neurological: He is alert. Coordination normal.  The patient is alert but sleepy, he is able to follow my commands, sit up in the bed, raise both of his legs and both of his arms, able to count my fingers in all peripheral visual fields  Skin: Skin is warm and dry.  No rash noted. No erythema.  Psychiatric: He has a normal mood and affect. His behavior is normal.  Nursing note and vitals reviewed.    ED Treatments / Results  Labs (all labs ordered are listed, but only abnormal results are displayed) Labs Reviewed  COMPREHENSIVE METABOLIC PANEL - Abnormal; Notable for the following:       Result Value   Glucose, Bld 150 (*)    BUN 59 (*)    Creatinine, Ser 1.91 (*)    Calcium 8.8 (*)    Albumin 2.3 (*)    GFR calc non Af Amer 35 (*)    GFR calc Af Amer 41 (*)    All other components within normal limits  CBC WITH DIFFERENTIAL/PLATELET - Abnormal; Notable for the following:    WBC 2.8 (*)    RBC 3.33 (*)    Hemoglobin 9.1 (*)    HCT 29.0 (*)    Platelets 121 (*)    Neutro Abs 1.2 (*)    All other components within normal limits  I-STAT CG4 LACTIC ACID, ED - Abnormal; Notable for the following:    Lactic Acid, Venous 2.00 (*)    All other components within normal limits  CULTURE, BLOOD (ROUTINE X 2)  CULTURE, BLOOD (ROUTINE X 2)  URINE CULTURE  URINALYSIS, ROUTINE W REFLEX MICROSCOPIC  I-STAT CG4 LACTIC ACID, ED     Radiology Ct Head Wo Contrast  Result Date: 07/21/2016 CLINICAL DATA:  Altered mental status, staring at TV in day room at 0152 hours, decreased responsiveness, weakness, history chronic systolic heart failure, type II diabetes mellitus, non ischemic cardiomyopathy, essential benign hypertension, COPD, prior stroke EXAM: CT HEAD WITHOUT CONTRAST TECHNIQUE: Contiguous axial images were obtained from the base of the skull through the vertex without intravenous contrast. COMPARISON:  09/22/2015 FINDINGS: Brain: Generalized atrophy. Normal ventricular morphology. No midline shift or mass effect. Small vessel chronic ischemic changes of deep cerebral white matter. Old BILATERAL deep cerebellar infarcts unchanged. Old posterior RIGHT parieto-occipital infarct. Old lacunar infarcts in BILATERAL basal ganglia, LEFT thalamus, RIGHT  pons, can RIGHT caudate. Low-attenuation at the LEFT pons suspect related to beam hardening artifacts from skullbase. No intracranial hemorrhage, mass lesion, definite evidence of acute infarction, or extra-axial fluid collection. Vascular: Atherosclerotic calcifications at the carotid siphons bilaterally Skull: Intact Sinuses/Orbits: Clear Other: N/A IMPRESSION: Atrophy with small vessel chronic ischemic changes of deep cerebral white matter. Multiple old infarcts as above. No acute intracranial abnormalities. Electronically Signed   By: Lavonia Dana M.D.   On: 07/21/2016 10:11   Dg Chest Port 1 View  Result Date: 07/21/2016 CLINICAL DATA:  Altered mental status EXAM: PORTABLE CHEST 1 VIEW COMPARISON:  June 26, 2016 FINDINGS: There is no edema or consolidation. Heart is mildly enlarged with pulmonary vascularity within normal limits. There is atherosclerotic calcification aorta. No adenopathy. Pacemaker lead tip is attached to the right ventricle. No bone lesions. IMPRESSION: Stable cardiac enlargement. Aortic atherosclerosis. No edema or consolidation. Electronically Signed   By: Lowella Grip III M.D.   On: 07/21/2016 08:32    Procedures Procedures (including critical care time)  Medications Ordered in ED Medications - No data to display   Initial Impression / Assessment and Plan / ED Course  I have reviewed the triage vital signs and the nursing notes.  Pertinent labs & imaging results that were available during my care of the patient were reviewed by me and considered in my medical decision making (see chart for details).     It was reported that the patient was unresponsive however he was able to walk both pre-EMS as well as for the EMS stretcher. He was able to walk from the stretcher to the bed in the room and at this time has to be what seems  like a normal baseline mental status and neurologic exam. We'll proceed with altered mental status workup though at this time the patient's  exam is reassuring. He does have a slight hypotension. It does appear that the complaints for which she is being seen today are similar to the complaints for which she was seen during his prior admission and had an unremarkable EEG.  The labs have been reassuring, he has a chronic leukopenia and anemia, additionally his creatinine seems to be close to baseline. CT shows multiple old strokes, nothing new - pt's sister here and has also noted a general decline over month for patient.  He is not eating much we have made recommendations to NH for meal modificadtions - pt stable for d/c.  Vitals:   07/21/16 1200 07/21/16 1230 07/21/16 1250 07/21/16 1300  BP: 128/69 115/68 115/68 141/69  Pulse: 78 73 77 74  Resp: 20 16 19 21   Temp:      TempSrc:      SpO2: 100% 100% 100% 100%     Final Clinical Impressions(s) / ED Diagnoses   Final diagnoses:  Altered mental status, unspecified altered mental status type    New Prescriptions New Prescriptions   No medications on file     Noemi Chapel, MD 07/21/16 1454

## 2016-07-21 NOTE — ED Notes (Signed)
Pt alert and verbally responsive. Sister at bedside. Able to feed himself 2 containers of applesauce

## 2016-07-21 NOTE — ED Notes (Signed)
Patient assisted x 1 staff to standing position to assist with urinal to see if patient would be able to get a sample at this time. No urine available. Patient tolerated standing with minor assistance well. Patient assisted back the bed. RN made aware.

## 2016-07-21 NOTE — ED Notes (Signed)
Patient CG4 2.00 MD notified.

## 2016-07-21 NOTE — ED Triage Notes (Signed)
Per EMS: Pt from [place] center called out for altered mental status, "staring at tv in dayroom" around 0152, not as responsive.  Pt was ambulatory to stretcher with equal grips and is currently speaking to staff.  cbg 116, hr 85, 22rr,

## 2016-07-21 NOTE — ED Notes (Signed)
Patient transported to CT 

## 2016-07-21 NOTE — ED Notes (Signed)
Dr Sabra Heck notified of the delay in obtaining labs

## 2016-07-21 NOTE — ED Notes (Addendum)
Pt difficult to obtain blood. Vein blows or will not pull back blood. Phlebotomist at bedside now

## 2016-07-26 ENCOUNTER — Encounter: Payer: Medicare HMO | Admitting: *Deleted

## 2016-07-26 LAB — CULTURE, BLOOD (ROUTINE X 2)
CULTURE: NO GROWTH
Culture: NO GROWTH

## 2016-08-01 ENCOUNTER — Encounter (HOSPITAL_COMMUNITY): Payer: Self-pay

## 2016-08-01 ENCOUNTER — Encounter (HOSPITAL_COMMUNITY): Payer: Medicare HMO

## 2016-08-01 ENCOUNTER — Encounter (HOSPITAL_COMMUNITY)
Admission: RE | Admit: 2016-08-01 | Discharge: 2016-08-01 | Disposition: A | Discharge status: Home / Self Care | Payer: Medicare HMO | Source: Skilled Nursing Facility | Attending: Pulmonary Disease | Admitting: Pulmonary Disease

## 2016-08-01 ENCOUNTER — Encounter (HOSPITAL_COMMUNITY): Payer: Medicare HMO | Attending: Oncology

## 2016-08-01 VITALS — BP 70/50 | HR 80 | Temp 97.9°F | Resp 20

## 2016-08-01 DIAGNOSIS — D61818 Other pancytopenia: Secondary | ICD-10-CM

## 2016-08-01 DIAGNOSIS — E1129 Type 2 diabetes mellitus with other diabetic kidney complication: Secondary | ICD-10-CM | POA: Diagnosis not present

## 2016-08-01 DIAGNOSIS — E1122 Type 2 diabetes mellitus with diabetic chronic kidney disease: Secondary | ICD-10-CM | POA: Diagnosis not present

## 2016-08-01 DIAGNOSIS — I639 Cerebral infarction, unspecified: Secondary | ICD-10-CM | POA: Insufficient documentation

## 2016-08-01 DIAGNOSIS — A492 Hemophilus influenzae infection, unspecified site: Secondary | ICD-10-CM | POA: Insufficient documentation

## 2016-08-01 DIAGNOSIS — D631 Anemia in chronic kidney disease: Secondary | ICD-10-CM

## 2016-08-01 DIAGNOSIS — N183 Chronic kidney disease, stage 3 unspecified: Secondary | ICD-10-CM

## 2016-08-01 DIAGNOSIS — B370 Candidal stomatitis: Secondary | ICD-10-CM | POA: Insufficient documentation

## 2016-08-01 DIAGNOSIS — R634 Abnormal weight loss: Secondary | ICD-10-CM | POA: Insufficient documentation

## 2016-08-01 DIAGNOSIS — N189 Chronic kidney disease, unspecified: Secondary | ICD-10-CM | POA: Insufficient documentation

## 2016-08-01 LAB — CBC WITH DIFFERENTIAL/PLATELET
BASOS PCT: 0 %
BASOS PCT: 0 %
Basophils Absolute: 0 10*3/uL (ref 0.0–0.1)
Basophils Absolute: 0 10*3/uL (ref 0.0–0.1)
Eosinophils Absolute: 0.1 10*3/uL (ref 0.0–0.7)
Eosinophils Absolute: 0.1 10*3/uL (ref 0.0–0.7)
Eosinophils Relative: 3 %
Eosinophils Relative: 4 %
HEMATOCRIT: 31.1 % — AB (ref 39.0–52.0)
HEMATOCRIT: 31.9 % — AB (ref 39.0–52.0)
HEMOGLOBIN: 9.6 g/dL — AB (ref 13.0–17.0)
Hemoglobin: 9.7 g/dL — ABNORMAL LOW (ref 13.0–17.0)
LYMPHS PCT: 38 %
Lymphocytes Relative: 33 %
Lymphs Abs: 1.1 10*3/uL (ref 0.7–4.0)
Lymphs Abs: 0.8 10*3/uL (ref 0.7–4.0)
MCH: 27.1 pg (ref 26.0–34.0)
MCH: 27.2 pg (ref 26.0–34.0)
MCHC: 31.2 g/dL (ref 30.0–36.0)
MCHC: 30.1 g/dL (ref 30.0–36.0)
MCV: 86.9 fL (ref 78.0–100.0)
MCV: 90.4 fL (ref 78.0–100.0)
MONO ABS: 0.4 10*3/uL (ref 0.1–1.0)
MONOS PCT: 13 %
Monocytes Absolute: 0.3 10*3/uL (ref 0.1–1.0)
Monocytes Relative: 15 %
NEUTROS ABS: 1.2 10*3/uL — AB (ref 1.7–7.7)
Neutro Abs: 1.3 10*3/uL — ABNORMAL LOW (ref 1.7–7.7)
Neutrophils Relative %: 44 %
Neutrophils Relative %: 50 %
PLATELETS: 182 10*3/uL (ref 150–400)
Platelets: 176 10*3/uL (ref 150–400)
RBC: 3.58 MIL/uL — AB (ref 4.22–5.81)
RBC: 3.53 MIL/uL — ABNORMAL LOW (ref 4.22–5.81)
RDW: 15 % (ref 11.5–15.5)
RDW: 14.9 % (ref 11.5–15.5)
WBC: 2.9 10*3/uL — AB (ref 4.0–10.5)
WBC: 2.5 10*3/uL — ABNORMAL LOW (ref 4.0–10.5)

## 2016-08-01 LAB — FOLATE: FOLATE: 16.8 ng/mL (ref >5.9)

## 2016-08-01 LAB — IRON AND TIBC
IRON: 47 ug/dL (ref 45–182)
Saturation Ratios: 21 % (ref 17.9–39.5)
TIBC: 228 ug/dL — ABNORMAL LOW (ref 250–450)
UIBC: 181 ug/dL

## 2016-08-01 LAB — FERRITIN: FERRITIN: 591 ng/mL — AB (ref 24–336)

## 2016-08-01 LAB — VITAMIN B12: Vitamin B-12: 503 pg/mL (ref 180–914)

## 2016-08-01 LAB — LACTATE DEHYDROGENASE: LDH: 261 U/L — ABNORMAL HIGH (ref 98–192)

## 2016-08-01 MED ORDER — DARBEPOETIN ALFA 60 MCG/0.3ML IJ SOSY
PREFILLED_SYRINGE | INTRAMUSCULAR | Status: AC
Start: 2016-08-01 — End: 2016-08-01
  Filled 2016-08-01: qty 0.3

## 2016-08-01 MED ORDER — DARBEPOETIN ALFA 60 MCG/0.3ML IJ SOSY
50.0000 ug | PREFILLED_SYRINGE | Freq: Once | INTRAMUSCULAR | Status: AC
  Administered 2016-08-01: 50 ug via SUBCUTANEOUS

## 2016-08-01 NOTE — Progress Notes (Signed)
Okey Regal Borbon tolerated Aranesp injection well without complaints. B/P 75/45 and 70/50 this am but pt denies any issues. Pt's B/P has been WN's for the past week per [facility] employee. Reviewed B/P and Hgb 9.6 with [name] PA and pt OK for Aranesp injection and F/U with PCP if needed.Pt discharged via wheelchair in stable condition accompanied by caregiver from [facility].

## 2016-08-01 NOTE — Patient Instructions (Signed)
[facility] at [facility] Discharge Instructions  RECOMMENDATIONS MADE BY THE CONSULTANT AND ANY TEST RESULTS WILL BE SENT TO YOUR REFERRING PHYSICIAN.  Received Aranesp injection today. Follow-up as scheduled. Call clinic for any questions or concerns  Thank you for choosing [facility] at [facility] to provide your oncology and hematology care.  To afford each patient quality time with our provider, please arrive at least 15 minutes before your scheduled appointment time.    If you have a lab appointment with the Cancer Center please come in thru the  Main Entrance and check in at the main information desk  You need to re-schedule your appointment should you arrive 10 or more minutes late.  We strive to give you quality time with our providers, and arriving late affects you and other patients whose appointments are after yours.  Also, if you no show three or more times for appointments you may be dismissed from the clinic at the providers discretion.     Again, thank you for choosing [facility].  Our hope is that these requests will decrease the amount of time that you wait before being seen by our physicians.       _____________________________________________________________  Should you have questions after your visit to  Cancer Center, please contact our office at [phone] between the hours of 8:30 a.m. and 4:30 p.m.  Voicemails left after 4:30 p.m. will not be returned until the following business day.  For prescription refill requests, have your pharmacy contact our office.       Resources For Cancer Patients and their Caregivers ? American Cancer Society: Can assist with transportation, wigs, general needs, runs Look Good Feel Better.        1-888-227-6333 ? Cancer Care: Provides financial assistance, online support groups, medication/co-pay assistance.  1-800-813-HOPE (4673) ? [facility] Assists [place] cancer patients and their families through emotional , educational and financial support.  336-427-4357 ? [place] DSS Where to apply for food stamps, Medicaid and utility assistance. 336-342-1394 ? RCATS: Transportation to medical appointments. 336-347-2287 ? Social Security Administration: May apply for disability if have a Stage IV cancer. 336-342-7796 1-800-772-1213 ? [place] Aging, Disability and Transit Services: Assists with nutrition, care and transit needs. 336-349-2343  Cancer Center Support Programs: @10RELATIVEDAYS@ > Cancer Support Group  2nd Tuesday of the month 1pm-2pm, Journey Room  > Creative Journey  3rd Tuesday of the month 1130am-1pm, Journey Room  > Look Good Feel Better  1st Wednesday of the month 10am-12 noon, Journey Room (Call American Cancer Society to register 1-800-395-5775)   

## 2016-08-02 LAB — PROTEIN ELECTROPHORESIS, SERUM
A/G RATIO SPE: 0.6 — AB (ref 0.7–1.7)
ALBUMIN ELP: 2.8 g/dL — AB (ref 2.9–4.4)
ALPHA-2-GLOBULIN: 0.7 g/dL (ref 0.4–1.0)
Alpha-1-Globulin: 0.2 g/dL (ref 0.0–0.4)
BETA GLOBULIN: 0.9 g/dL (ref 0.7–1.3)
Gamma Globulin: 3.2 g/dL — ABNORMAL HIGH (ref 0.4–1.8)
Globulin, Total: 4.9 g/dL — ABNORMAL HIGH (ref 2.2–3.9)
Total Protein ELP: 7.7 g/dL (ref 6.0–8.5)

## 2016-08-02 LAB — IMMUNOFIXATION ELECTROPHORESIS
IGG (IMMUNOGLOBIN G), SERUM: 3265 mg/dL — AB (ref 700–1600)
IgA: 207 mg/dL (ref 61–437)
IgM, Serum: 354 mg/dL — ABNORMAL HIGH (ref 20–172)
TOTAL PROTEIN ELP: 7.5 g/dL (ref 6.0–8.5)

## 2016-08-03 ENCOUNTER — Encounter: Payer: Self-pay | Admitting: Cardiology

## 2016-08-08 ENCOUNTER — Encounter (HOSPITAL_COMMUNITY)
Admission: RE | Admit: 2016-08-08 | Discharge: 2016-08-08 | Disposition: A | Discharge status: Home / Self Care | Payer: Medicare HMO | Source: Skilled Nursing Facility | Attending: Pulmonary Disease | Admitting: Pulmonary Disease

## 2016-08-08 DIAGNOSIS — M6281 Muscle weakness (generalized): Secondary | ICD-10-CM | POA: Insufficient documentation

## 2016-08-08 DIAGNOSIS — I639 Cerebral infarction, unspecified: Secondary | ICD-10-CM | POA: Diagnosis present

## 2016-08-08 DIAGNOSIS — R011 Cardiac murmur, unspecified: Secondary | ICD-10-CM | POA: Insufficient documentation

## 2016-08-08 DIAGNOSIS — R262 Difficulty in walking, not elsewhere classified: Secondary | ICD-10-CM | POA: Diagnosis present

## 2016-08-08 DIAGNOSIS — N189 Chronic kidney disease, unspecified: Secondary | ICD-10-CM | POA: Diagnosis present

## 2016-08-08 DIAGNOSIS — K210 Gastro-esophageal reflux disease with esophagitis: Secondary | ICD-10-CM | POA: Insufficient documentation

## 2016-08-08 LAB — RENAL FUNCTION PANEL
ALBUMIN: 2.6 g/dL — AB (ref 3.5–5.0)
Anion gap: 5 (ref 5–15)
BUN: 62 mg/dL — AB (ref 6–20)
CALCIUM: 9.3 mg/dL (ref 8.9–10.3)
CO2: 28 mmol/L (ref 22–32)
CREATININE: 1.53 mg/dL — AB (ref 0.61–1.24)
Chloride: 106 mmol/L (ref 101–111)
GFR calc Af Amer: 54 mL/min — ABNORMAL LOW (ref >60)
GFR calc non Af Amer: 46 mL/min — ABNORMAL LOW (ref >60)
GLUCOSE: 76 mg/dL (ref 65–99)
PHOSPHORUS: 4.6 mg/dL (ref 2.5–4.6)
Potassium: 4.5 mmol/L (ref 3.5–5.1)
SODIUM: 139 mmol/L (ref 135–145)

## 2016-08-08 LAB — IRON AND TIBC
Iron: 48 ug/dL (ref 45–182)
SATURATION RATIOS: 21 % (ref 17.9–39.5)
TIBC: 224 ug/dL — ABNORMAL LOW (ref 250–450)
UIBC: 176 ug/dL

## 2016-08-08 LAB — HEMOGLOBIN AND HEMATOCRIT, BLOOD
HEMATOCRIT: 32.9 % — AB (ref 39.0–52.0)
Hemoglobin: 10.4 g/dL — ABNORMAL LOW (ref 13.0–17.0)

## 2016-08-08 LAB — FERRITIN: FERRITIN: 510 ng/mL — AB (ref 24–336)

## 2016-08-09 ENCOUNTER — Other Ambulatory Visit (HOSPITAL_COMMUNITY)
Admission: RE | Admit: 2016-08-09 | Discharge: 2016-08-09 | Disposition: A | Discharge status: Home / Self Care | Payer: Medicare HMO | Source: Skilled Nursing Facility | Attending: Pulmonary Disease | Admitting: Pulmonary Disease

## 2016-08-09 DIAGNOSIS — N189 Chronic kidney disease, unspecified: Secondary | ICD-10-CM | POA: Diagnosis present

## 2016-08-09 LAB — PARATHYROID HORMONE, INTACT (NO CA): PTH: 19 pg/mL (ref 15–65)

## 2016-08-09 LAB — PROTEIN / CREATININE RATIO, URINE
Creatinine, Urine: 88.6 mg/dL
PROTEIN CREATININE RATIO: 0.95 mg/mg{creat} — AB (ref 0.00–0.15)
TOTAL PROTEIN, URINE: 84 mg/dL

## 2016-08-09 LAB — VITAMIN D 25 HYDROXY (VIT D DEFICIENCY, FRACTURES): Vit D, 25-Hydroxy: 70.1 ng/mL (ref 30.0–100.0)

## 2016-08-10 ENCOUNTER — Ambulatory Visit (INDEPENDENT_AMBULATORY_CARE_PROVIDER_SITE_OTHER): Payer: Medicare HMO | Admitting: *Deleted

## 2016-08-10 DIAGNOSIS — I429 Cardiomyopathy, unspecified: Secondary | ICD-10-CM

## 2016-08-10 NOTE — Progress Notes (Signed)
Remote ICD transmission.   

## 2016-08-11 ENCOUNTER — Encounter: Payer: Self-pay | Admitting: Cardiology

## 2016-08-15 ENCOUNTER — Encounter (HOSPITAL_COMMUNITY)
Admission: RE | Admit: 2016-08-15 | Discharge: 2016-08-15 | Disposition: A | Discharge status: Home / Self Care | Payer: Medicare HMO | Source: Skilled Nursing Facility | Attending: Pulmonary Disease | Admitting: Pulmonary Disease

## 2016-08-15 ENCOUNTER — Ambulatory Visit (HOSPITAL_COMMUNITY): Payer: Medicare HMO

## 2016-08-15 ENCOUNTER — Other Ambulatory Visit (HOSPITAL_COMMUNITY): Payer: Medicare HMO

## 2016-08-15 DIAGNOSIS — N189 Chronic kidney disease, unspecified: Secondary | ICD-10-CM | POA: Diagnosis not present

## 2016-08-15 LAB — CBC WITH DIFFERENTIAL/PLATELET
BASOS ABS: 0 10*3/uL (ref 0.0–0.1)
BASOS PCT: 0 %
EOS PCT: 1 %
Eosinophils Absolute: 0 10*3/uL (ref 0.0–0.7)
HEMATOCRIT: 33.1 % — AB (ref 39.0–52.0)
Hemoglobin: 10.3 g/dL — ABNORMAL LOW (ref 13.0–17.0)
Lymphocytes Relative: 25 %
Lymphs Abs: 1.1 10*3/uL (ref 0.7–4.0)
MCH: 27.5 pg (ref 26.0–34.0)
MCHC: 31.1 g/dL (ref 30.0–36.0)
MCV: 88.5 fL (ref 78.0–100.0)
MONO ABS: 0.6 10*3/uL (ref 0.1–1.0)
MONOS PCT: 15 %
NEUTROS ABS: 2.6 10*3/uL (ref 1.7–7.7)
NEUTROS PCT: 59 %
PLATELETS: 165 10*3/uL (ref 150–400)
RBC: 3.74 MIL/uL — ABNORMAL LOW (ref 4.22–5.81)
RDW: 15.6 % — AB (ref 11.5–15.5)
WBC: 4.4 10*3/uL (ref 4.0–10.5)

## 2016-08-15 NOTE — Progress Notes (Deleted)
Contacted home number listed - gentleman who answered phone states Keith Young is now a resident at the [facility].  Attempted to contact [facility] regarding why pt missed his lab appt and injection appt, but there was no answer at the multiple numbers contacted.

## 2016-08-16 LAB — CUP PACEART REMOTE DEVICE CHECK
Battery Remaining Percentage: 100 %
Date Time Interrogation Session: 20180314172600
HighPow Impedance: 58 Ohm
Implantable Lead Serial Number: 310936
Implantable Pulse Generator Implant Date: 20140617
Lead Channel Impedance Value: 456 Ohm
Lead Channel Pacing Threshold Pulse Width: 0.4 ms
Lead Channel Setting Sensing Sensitivity: 0.6 mV
MDC IDC LEAD IMPLANT DT: 20140617
MDC IDC LEAD LOCATION: 753862
MDC IDC MSMT BATTERY REMAINING LONGEVITY: 54 mo
MDC IDC MSMT LEADCHNL RV PACING THRESHOLD AMPLITUDE: 1.5 V
MDC IDC SET LEADCHNL RV PACING AMPLITUDE: 3 V
MDC IDC SET LEADCHNL RV PACING PULSEWIDTH: 1.2 ms
MDC IDC STAT BRADY RV PERCENT PACED: 0 %
Pulse Gen Serial Number: 436636

## 2016-08-17 ENCOUNTER — Ambulatory Visit (HOSPITAL_COMMUNITY): Payer: Medicare HMO

## 2016-08-17 ENCOUNTER — Encounter (HOSPITAL_COMMUNITY): Payer: Self-pay

## 2016-08-17 ENCOUNTER — Encounter (HOSPITAL_BASED_OUTPATIENT_CLINIC_OR_DEPARTMENT_OTHER): Payer: Medicare HMO

## 2016-08-17 ENCOUNTER — Encounter (HOSPITAL_COMMUNITY): Payer: Medicare HMO

## 2016-08-17 ENCOUNTER — Other Ambulatory Visit (HOSPITAL_COMMUNITY): Payer: Medicare HMO

## 2016-08-17 VITALS — BP 92/56 | HR 82 | Temp 97.5°F | Resp 18

## 2016-08-17 DIAGNOSIS — N183 Chronic kidney disease, stage 3 unspecified: Secondary | ICD-10-CM

## 2016-08-17 DIAGNOSIS — D631 Anemia in chronic kidney disease: Secondary | ICD-10-CM | POA: Diagnosis not present

## 2016-08-17 DIAGNOSIS — D61818 Other pancytopenia: Secondary | ICD-10-CM

## 2016-08-17 DIAGNOSIS — R634 Abnormal weight loss: Secondary | ICD-10-CM

## 2016-08-17 DIAGNOSIS — N189 Chronic kidney disease, unspecified: Secondary | ICD-10-CM

## 2016-08-17 DIAGNOSIS — B370 Candidal stomatitis: Secondary | ICD-10-CM

## 2016-08-17 LAB — CBC WITH DIFFERENTIAL/PLATELET
BASOS PCT: 0 %
Basophils Absolute: 0 10*3/uL (ref 0.0–0.1)
EOS ABS: 0.1 10*3/uL (ref 0.0–0.7)
EOS PCT: 3 %
HCT: 32.1 % — ABNORMAL LOW (ref 39.0–52.0)
HEMOGLOBIN: 10.3 g/dL — AB (ref 13.0–17.0)
Lymphocytes Relative: 24 %
Lymphs Abs: 0.7 10*3/uL (ref 0.7–4.0)
MCH: 28.5 pg (ref 26.0–34.0)
MCHC: 32.1 g/dL (ref 30.0–36.0)
MCV: 88.7 fL (ref 78.0–100.0)
Monocytes Absolute: 0.5 10*3/uL (ref 0.1–1.0)
Monocytes Relative: 17 %
NEUTROS PCT: 56 %
Neutro Abs: 1.7 10*3/uL (ref 1.7–7.7)
PLATELETS: 148 10*3/uL — AB (ref 150–400)
RBC: 3.62 MIL/uL — AB (ref 4.22–5.81)
RDW: 15.4 % (ref 11.5–15.5)
WBC: 3 10*3/uL — AB (ref 4.0–10.5)

## 2016-08-17 MED ORDER — DARBEPOETIN ALFA 60 MCG/0.3ML IJ SOSY
50.0000 ug | PREFILLED_SYRINGE | Freq: Once | INTRAMUSCULAR | Status: AC
  Administered 2016-08-17: 50 ug via SUBCUTANEOUS

## 2016-08-17 MED ORDER — DARBEPOETIN ALFA 60 MCG/0.3ML IJ SOSY
PREFILLED_SYRINGE | INTRAMUSCULAR | Status: AC
  Filled 2016-08-17: qty 0.3

## 2016-08-17 NOTE — Patient Instructions (Signed)
[facility] at [facility] Discharge Instructions  RECOMMENDATIONS MADE BY THE CONSULTANT AND ANY TEST RESULTS WILL BE SENT TO YOUR REFERRING PHYSICIAN.  Received Aranesp injection today. Follow-up as scheduled. Call clinic for any questions or concerns  Thank you for choosing [facility] at [facility] to provide your oncology and hematology care.  To afford each patient quality time with our provider, please arrive at least 15 minutes before your scheduled appointment time.    If you have a lab appointment with the Cancer Center please come in thru the  Main Entrance and check in at the main information desk  You need to re-schedule your appointment should you arrive 10 or more minutes late.  We strive to give you quality time with our providers, and arriving late affects you and other patients whose appointments are after yours.  Also, if you no show three or more times for appointments you may be dismissed from the clinic at the providers discretion.     Again, thank you for choosing [facility].  Our hope is that these requests will decrease the amount of time that you wait before being seen by our physicians.       _____________________________________________________________  Should you have questions after your visit to [facility], please contact our office at [phone] between the hours of 8:30 a.m. and 4:30 p.m.  Voicemails left after 4:30 p.m. will not be returned until the following business day.  For prescription refill requests, have your pharmacy contact our office.       Resources For Cancer Patients and their Caregivers ? American Cancer Society: Can assist with transportation, wigs, general needs, runs Look Good Feel Better.        1-888-227-6333 ? Cancer Care: Provides financial assistance, online support groups, medication/co-pay assistance.  1-800-813-HOPE (4673) ? [facility] Assists [place] cancer patients and their families through emotional , educational and financial support.  336-427-4357 ? [place] DSS Where to apply for food stamps, Medicaid and utility assistance. 336-342-1394 ? RCATS: Transportation to medical appointments. 336-347-2287 ? Social Security Administration: May apply for disability if have a Stage IV cancer. 336-342-7796 1-800-772-1213 ? [place] Aging, Disability and Transit Services: Assists with nutrition, care and transit needs. 336-349-2343  Cancer Center Support Programs: @10RELATIVEDAYS@ > Cancer Support Group  2nd Tuesday of the month 1pm-2pm, Journey Room  > Creative Journey  3rd Tuesday of the month 1130am-1pm, Journey Room  > Look Good Feel Better  1st Wednesday of the month 10am-12 noon, Journey Room (Call American Cancer Society to register 1-800-395-5775)   

## 2016-08-17 NOTE — Progress Notes (Signed)
Keith Young tolerated Aranesp injection well without complaints or incident. Hgb 10.3 VSS Pt discharged via wheelchair in satisfactory condition accompanied by caregiver form [facility]

## 2016-08-18 LAB — HEPATITIS PANEL, ACUTE
HCV Ab: <0.1 s/co ratio (ref 0.0–0.9)
HEP B S AG: NEGATIVE
Hep A IgM: NEGATIVE
Hep B C IgM: NEGATIVE

## 2016-08-21 NOTE — Progress Notes (Signed)
Keith Young, Keith Young               ACCOUNT NO.:  [id number]  MEDICAL RECORD NO.:  [id number]  LOCATION:  APA07                         FACILITY:  [facility]  PHYSICIAN:  Mariabelen Pressly L. Luan Pulling, M.D.DATE OF BIRTH:  1951/02/12  DATE OF PROCEDURE: DATE OF DISCHARGE:                                PROGRESS NOTE   The system will not let me do it on the Epic system.  SUBJECTIVE:  I saw Keith Young on the 24th at the skilled care facility. He has a significantly complicated medical history including stroke at least x2, episodes of altered mental status, cardiomyopathy, pacer/defibrillator placement, alcoholism, aortic insufficiency, chronic systolic heart failure, chronic kidney disease stage 3, COPD, hypertension, nonsustained ventricular tachycardia, diabetes, malnutrition, and anemia of chronic disease.  He says he feels okay.  He has no new complaints.  He is not having any chest pain, nausea, or vomiting.  He has had blood testing done that looked okay.  I reviewed the records in the [facility].  PHYSICAL EXAMINATION:  CONSTITUTIONAL:  He is awake and alert, sitting in a wheelchair.  He does appear a little bit sleepy. EYES:  Pupils react.  EOMI. EARS, NOSE, MOUTH, AND THROAT:  Mucous membranes are moist.  His hearing seems slightly diminished. CARDIOVASCULAR:  His heart is regular with no gallop.  No edema. RESPIRATORY:  His respiratory effort is normal.  His lungs are clear. GASTROINTESTINAL:  His abdomen is soft with no masses. MUSCULOSKELETAL:  He appears generally weak but no focal weakness. NEUROLOGICAL:  He is awake and responsive. PSYCHIATRIC:  Normal mood and affect.  ASSESSMENT:  He has a very complicated situation.  He has had another stroke.  He is doing rehab.  He has severe cardiac disease and that is about the same.  He has chronic kidney disease with no change.  He has diabetes and his blood sugar has been pretty good and in fact, he has had some low blood sugars.  He has anemia  of chronic disease and that was checked recently and is stable.  My plan is to continue his efforts at rehab.  Continue following blood testing, etc., and see how he does.     Arminta Gamm L. Luan Pulling, M.D.     ELH/MEDQ  D:  08/21/2016  T:  08/21/2016  Job:  [id number]

## 2016-08-25 ENCOUNTER — Encounter: Payer: Self-pay | Admitting: Cardiology

## 2016-08-26 ENCOUNTER — Other Ambulatory Visit (HOSPITAL_COMMUNITY): Payer: Self-pay | Admitting: Pulmonary Disease

## 2016-08-26 ENCOUNTER — Telehealth: Payer: Self-pay | Admitting: Cardiology

## 2016-08-26 DIAGNOSIS — W19XXXA Unspecified fall, initial encounter: Secondary | ICD-10-CM

## 2016-08-26 DIAGNOSIS — R52 Pain, unspecified: Secondary | ICD-10-CM

## 2016-08-26 NOTE — Telephone Encounter (Signed)
Carolee Rota w/ [facility] called and wanted to know pt device info for an upcoming MRI appt for pt. I gave her the information and informed her that according to our recorders pt device is not MRI compatible. Toya verbalized understanding.

## 2016-08-29 ENCOUNTER — Encounter (HOSPITAL_COMMUNITY): Payer: Medicare HMO | Attending: Hematology & Oncology

## 2016-08-29 ENCOUNTER — Encounter (HOSPITAL_COMMUNITY)
Admission: RE | Admit: 2016-08-29 | Discharge: 2016-08-29 | Disposition: A | Discharge status: Home / Self Care | Payer: Medicare HMO | Source: Skilled Nursing Facility | Attending: Pulmonary Disease | Admitting: Pulmonary Disease

## 2016-08-29 ENCOUNTER — Ambulatory Visit (HOSPITAL_COMMUNITY): Payer: Medicare HMO

## 2016-08-29 DIAGNOSIS — D61818 Other pancytopenia: Secondary | ICD-10-CM | POA: Insufficient documentation

## 2016-08-29 DIAGNOSIS — A492 Hemophilus influenzae infection, unspecified site: Secondary | ICD-10-CM | POA: Diagnosis present

## 2016-08-29 DIAGNOSIS — N189 Chronic kidney disease, unspecified: Secondary | ICD-10-CM | POA: Diagnosis not present

## 2016-08-29 DIAGNOSIS — E1129 Type 2 diabetes mellitus with other diabetic kidney complication: Secondary | ICD-10-CM | POA: Insufficient documentation

## 2016-08-29 DIAGNOSIS — D631 Anemia in chronic kidney disease: Secondary | ICD-10-CM | POA: Insufficient documentation

## 2016-08-29 DIAGNOSIS — E1122 Type 2 diabetes mellitus with diabetic chronic kidney disease: Secondary | ICD-10-CM | POA: Diagnosis not present

## 2016-08-29 DIAGNOSIS — N183 Chronic kidney disease, stage 3 (moderate): Secondary | ICD-10-CM | POA: Insufficient documentation

## 2016-08-29 DIAGNOSIS — I639 Cerebral infarction, unspecified: Secondary | ICD-10-CM | POA: Insufficient documentation

## 2016-08-29 LAB — CBC WITH DIFFERENTIAL/PLATELET
BASOS ABS: 0 10*3/uL (ref 0.0–0.1)
Basophils Relative: 0 %
EOS ABS: 0.1 10*3/uL (ref 0.0–0.7)
EOS PCT: 3 %
HCT: 36.8 % — ABNORMAL LOW (ref 39.0–52.0)
Hemoglobin: 11.7 g/dL — ABNORMAL LOW (ref 13.0–17.0)
Lymphocytes Relative: 19 %
Lymphs Abs: 0.6 10*3/uL — ABNORMAL LOW (ref 0.7–4.0)
MCH: 27.3 pg (ref 26.0–34.0)
MCHC: 31.8 g/dL (ref 30.0–36.0)
MCV: 86 fL (ref 78.0–100.0)
MONO ABS: 0.4 10*3/uL (ref 0.1–1.0)
Monocytes Relative: 13 %
Neutro Abs: 1.9 10*3/uL (ref 1.7–7.7)
Neutrophils Relative %: 65 %
PLATELETS: 184 10*3/uL (ref 150–400)
RBC: 4.28 MIL/uL (ref 4.22–5.81)
RDW: 14.8 % (ref 11.5–15.5)
WBC: 2.9 10*3/uL — AB (ref 4.0–10.5)

## 2016-08-31 ENCOUNTER — Other Ambulatory Visit (HOSPITAL_COMMUNITY): Payer: Self-pay | Admitting: Pulmonary Disease

## 2016-08-31 ENCOUNTER — Ambulatory Visit (HOSPITAL_COMMUNITY)
Admission: RE | Admit: 2016-08-31 | Discharge: 2016-08-31 | Disposition: A | Discharge status: Home / Self Care | Payer: Medicare HMO | Source: Ambulatory Visit | Attending: Pulmonary Disease | Admitting: Pulmonary Disease

## 2016-08-31 DIAGNOSIS — R52 Pain, unspecified: Secondary | ICD-10-CM

## 2016-08-31 DIAGNOSIS — M25561 Pain in right knee: Secondary | ICD-10-CM | POA: Insufficient documentation

## 2016-09-02 ENCOUNTER — Encounter: Payer: Self-pay | Admitting: Internal Medicine

## 2016-09-04 ENCOUNTER — Other Ambulatory Visit: Payer: Self-pay

## 2016-09-04 ENCOUNTER — Encounter (HOSPITAL_COMMUNITY): Payer: Self-pay | Admitting: Emergency Medicine

## 2016-09-04 ENCOUNTER — Inpatient Hospital Stay (HOSPITAL_COMMUNITY)
Admission: EM | Admit: 2016-09-04 | Discharge: 2016-09-12 | DRG: 682 | Disposition: A | Discharge status: Skilled Nursing Facility | Payer: Medicare HMO | Attending: Pulmonary Disease | Admitting: Pulmonary Disease

## 2016-09-04 DIAGNOSIS — I428 Other cardiomyopathies: Secondary | ICD-10-CM | POA: Diagnosis present

## 2016-09-04 DIAGNOSIS — R64 Cachexia: Secondary | ICD-10-CM | POA: Diagnosis not present

## 2016-09-04 DIAGNOSIS — N19 Unspecified kidney failure: Secondary | ICD-10-CM | POA: Diagnosis not present

## 2016-09-04 DIAGNOSIS — E7800 Pure hypercholesterolemia, unspecified: Secondary | ICD-10-CM | POA: Diagnosis present

## 2016-09-04 DIAGNOSIS — B9620 Unspecified Escherichia coli [E. coli] as the cause of diseases classified elsewhere: Secondary | ICD-10-CM | POA: Diagnosis present

## 2016-09-04 DIAGNOSIS — E1122 Type 2 diabetes mellitus with diabetic chronic kidney disease: Secondary | ICD-10-CM | POA: Diagnosis present

## 2016-09-04 DIAGNOSIS — I5022 Chronic systolic (congestive) heart failure: Secondary | ICD-10-CM | POA: Diagnosis present

## 2016-09-04 DIAGNOSIS — E785 Hyperlipidemia, unspecified: Secondary | ICD-10-CM | POA: Diagnosis present

## 2016-09-04 DIAGNOSIS — E875 Hyperkalemia: Secondary | ICD-10-CM | POA: Diagnosis not present

## 2016-09-04 DIAGNOSIS — Z95810 Presence of automatic (implantable) cardiac defibrillator: Secondary | ICD-10-CM

## 2016-09-04 DIAGNOSIS — N179 Acute kidney failure, unspecified: Secondary | ICD-10-CM | POA: Diagnosis not present

## 2016-09-04 DIAGNOSIS — Z87891 Personal history of nicotine dependence: Secondary | ICD-10-CM

## 2016-09-04 DIAGNOSIS — N183 Chronic kidney disease, stage 3 unspecified: Secondary | ICD-10-CM | POA: Diagnosis present

## 2016-09-04 DIAGNOSIS — F0390 Unspecified dementia without behavioral disturbance: Secondary | ICD-10-CM | POA: Diagnosis present

## 2016-09-04 DIAGNOSIS — E871 Hypo-osmolality and hyponatremia: Secondary | ICD-10-CM | POA: Diagnosis present

## 2016-09-04 DIAGNOSIS — K7460 Unspecified cirrhosis of liver: Secondary | ICD-10-CM | POA: Diagnosis present

## 2016-09-04 DIAGNOSIS — I509 Heart failure, unspecified: Secondary | ICD-10-CM

## 2016-09-04 DIAGNOSIS — N170 Acute kidney failure with tubular necrosis: Principal | ICD-10-CM | POA: Diagnosis present

## 2016-09-04 DIAGNOSIS — E872 Acidosis: Secondary | ICD-10-CM | POA: Diagnosis not present

## 2016-09-04 DIAGNOSIS — Z66 Do not resuscitate: Secondary | ICD-10-CM | POA: Diagnosis present

## 2016-09-04 DIAGNOSIS — E43 Unspecified severe protein-calorie malnutrition: Secondary | ICD-10-CM | POA: Diagnosis present

## 2016-09-04 DIAGNOSIS — I4891 Unspecified atrial fibrillation: Secondary | ICD-10-CM | POA: Diagnosis present

## 2016-09-04 DIAGNOSIS — R509 Fever, unspecified: Secondary | ICD-10-CM

## 2016-09-04 DIAGNOSIS — Z7982 Long term (current) use of aspirin: Secondary | ICD-10-CM

## 2016-09-04 DIAGNOSIS — M898X9 Other specified disorders of bone, unspecified site: Secondary | ICD-10-CM | POA: Diagnosis present

## 2016-09-04 DIAGNOSIS — Z515 Encounter for palliative care: Secondary | ICD-10-CM

## 2016-09-04 DIAGNOSIS — I69351 Hemiplegia and hemiparesis following cerebral infarction affecting right dominant side: Secondary | ICD-10-CM

## 2016-09-04 DIAGNOSIS — Z9181 History of falling: Secondary | ICD-10-CM

## 2016-09-04 DIAGNOSIS — Z7189 Other specified counseling: Secondary | ICD-10-CM

## 2016-09-04 DIAGNOSIS — I130 Hypertensive heart and chronic kidney disease with heart failure and stage 1 through stage 4 chronic kidney disease, or unspecified chronic kidney disease: Secondary | ICD-10-CM | POA: Diagnosis present

## 2016-09-04 DIAGNOSIS — E119 Type 2 diabetes mellitus without complications: Secondary | ICD-10-CM

## 2016-09-04 DIAGNOSIS — R319 Hematuria, unspecified: Secondary | ICD-10-CM

## 2016-09-04 DIAGNOSIS — I10 Essential (primary) hypertension: Secondary | ICD-10-CM

## 2016-09-04 DIAGNOSIS — Z974 Presence of external hearing-aid: Secondary | ICD-10-CM

## 2016-09-04 DIAGNOSIS — Z841 Family history of disorders of kidney and ureter: Secondary | ICD-10-CM

## 2016-09-04 DIAGNOSIS — E860 Dehydration: Secondary | ICD-10-CM | POA: Diagnosis present

## 2016-09-04 DIAGNOSIS — B20 Human immunodeficiency virus [HIV] disease: Secondary | ICD-10-CM | POA: Diagnosis present

## 2016-09-04 DIAGNOSIS — D631 Anemia in chronic kidney disease: Secondary | ICD-10-CM | POA: Diagnosis present

## 2016-09-04 DIAGNOSIS — R7881 Bacteremia: Secondary | ICD-10-CM | POA: Diagnosis present

## 2016-09-04 DIAGNOSIS — I2510 Atherosclerotic heart disease of native coronary artery without angina pectoris: Secondary | ICD-10-CM | POA: Diagnosis present

## 2016-09-04 DIAGNOSIS — Z7401 Bed confinement status: Secondary | ICD-10-CM

## 2016-09-04 DIAGNOSIS — N189 Chronic kidney disease, unspecified: Secondary | ICD-10-CM

## 2016-09-04 DIAGNOSIS — Z8673 Personal history of transient ischemic attack (TIA), and cerebral infarction without residual deficits: Secondary | ICD-10-CM

## 2016-09-04 DIAGNOSIS — F1021 Alcohol dependence, in remission: Secondary | ICD-10-CM | POA: Diagnosis present

## 2016-09-04 DIAGNOSIS — Z681 Body mass index (BMI) 19 or less, adult: Secondary | ICD-10-CM

## 2016-09-04 DIAGNOSIS — J449 Chronic obstructive pulmonary disease, unspecified: Secondary | ICD-10-CM | POA: Diagnosis present

## 2016-09-04 DIAGNOSIS — N390 Urinary tract infection, site not specified: Secondary | ICD-10-CM | POA: Diagnosis present

## 2016-09-04 HISTORY — DX: Human immunodeficiency virus (HIV) disease: B20

## 2016-09-04 LAB — CBC WITH DIFFERENTIAL/PLATELET
Basophils Absolute: 0 10*3/uL (ref 0.0–0.1)
Basophils Relative: 0 %
EOS ABS: 0.1 10*3/uL (ref 0.0–0.7)
EOS PCT: 3 %
HCT: 36.8 % — ABNORMAL LOW (ref 39.0–52.0)
Hemoglobin: 11.9 g/dL — ABNORMAL LOW (ref 13.0–17.0)
LYMPHS ABS: 0.7 10*3/uL (ref 0.7–4.0)
Lymphocytes Relative: 24 %
MCH: 27.5 pg (ref 26.0–34.0)
MCHC: 32.3 g/dL (ref 30.0–36.0)
MCV: 85.2 fL (ref 78.0–100.0)
MONOS PCT: 15 %
Monocytes Absolute: 0.4 10*3/uL (ref 0.1–1.0)
Neutro Abs: 1.7 10*3/uL (ref 1.7–7.7)
Neutrophils Relative %: 58 %
PLATELETS: 179 10*3/uL (ref 150–400)
RBC: 4.32 MIL/uL (ref 4.22–5.81)
RDW: 14.6 % (ref 11.5–15.5)
WBC: 3 10*3/uL — ABNORMAL LOW (ref 4.0–10.5)

## 2016-09-04 LAB — BASIC METABOLIC PANEL
ANION GAP: 8 (ref 5–15)
BUN: 117 mg/dL — ABNORMAL HIGH (ref 6–20)
CHLORIDE: 104 mmol/L (ref 101–111)
CO2: 19 mmol/L — ABNORMAL LOW (ref 22–32)
Calcium: 9.1 mg/dL (ref 8.9–10.3)
Creatinine, Ser: 4.41 mg/dL — ABNORMAL HIGH (ref 0.61–1.24)
GFR calc Af Amer: 15 mL/min — ABNORMAL LOW (ref >60)
GFR, EST NON AFRICAN AMERICAN: 13 mL/min — AB (ref >60)
Glucose, Bld: 123 mg/dL — ABNORMAL HIGH (ref 65–99)
POTASSIUM: 6.4 mmol/L — AB (ref 3.5–5.1)
SODIUM: 131 mmol/L — AB (ref 135–145)

## 2016-09-04 LAB — URINALYSIS, ROUTINE W REFLEX MICROSCOPIC
BILIRUBIN URINE: NEGATIVE
GLUCOSE, UA: NEGATIVE mg/dL
KETONES UR: NEGATIVE mg/dL
Nitrite: NEGATIVE
PH: 5 (ref 5.0–8.0)
Protein, ur: 30 mg/dL — AB
SPECIFIC GRAVITY, URINE: 1.011 (ref 1.005–1.030)

## 2016-09-04 MED ORDER — MIRTAZAPINE 15 MG PO TABS
7.5000 mg | ORAL_TABLET | Freq: Every day | ORAL | Status: DC
  Administered 2016-09-06 – 2016-09-12 (×7): 7.5 mg via ORAL
  Filled 2016-09-04 (×9): qty 1

## 2016-09-04 MED ORDER — ATORVASTATIN CALCIUM 40 MG PO TABS
80.0000 mg | ORAL_TABLET | Freq: Every day | ORAL | Status: DC
  Administered 2016-09-06 – 2016-09-12 (×7): 80 mg via ORAL
  Filled 2016-09-04 (×9): qty 2

## 2016-09-04 MED ORDER — SODIUM POLYSTYRENE SULFONATE 15 GM/60ML PO SUSP
30.0000 g | Freq: Once | ORAL | Status: DC
  Filled 2016-09-04: qty 120

## 2016-09-04 MED ORDER — ISOSORBIDE DINITRATE 20 MG PO TABS
40.0000 mg | ORAL_TABLET | Freq: Three times a day (TID) | ORAL | Status: DC
  Administered 2016-09-05: 40 mg via ORAL
  Filled 2016-09-04: qty 2

## 2016-09-04 MED ORDER — SODIUM CHLORIDE 0.9 % IV SOLN
INTRAVENOUS | Status: DC
  Administered 2016-09-04: via INTRAVENOUS

## 2016-09-04 MED ORDER — SODIUM CHLORIDE 0.9 % IV BOLUS (SEPSIS)
1000.0000 mL | Freq: Once | INTRAVENOUS | Status: AC
  Administered 2016-09-04: 1000 mL via INTRAVENOUS

## 2016-09-04 MED ORDER — PANTOPRAZOLE SODIUM 40 MG PO TBEC
40.0000 mg | DELAYED_RELEASE_TABLET | Freq: Every day | ORAL | Status: DC
  Administered 2016-09-06 – 2016-09-12 (×7): 40 mg via ORAL
  Filled 2016-09-04 (×8): qty 1

## 2016-09-04 MED ORDER — ONDANSETRON HCL 4 MG/2ML IJ SOLN: 4.0000 mg | Freq: Four times a day (QID) | INTRAMUSCULAR | Status: DC | PRN

## 2016-09-04 MED ORDER — SODIUM POLYSTYRENE SULFONATE 15 GM/60ML PO SUSP
30.0000 g | Freq: Once | ORAL | Status: AC
  Administered 2016-09-04: 30 g via ORAL

## 2016-09-04 MED ORDER — MIRABEGRON ER 25 MG PO TB24
25.0000 mg | ORAL_TABLET | Freq: Every day | ORAL | Status: DC
  Administered 2016-09-06 – 2016-09-12 (×7): 25 mg via ORAL
  Filled 2016-09-04 (×9): qty 1

## 2016-09-04 MED ORDER — ONDANSETRON HCL 4 MG PO TABS: 4.0000 mg | ORAL_TABLET | Freq: Four times a day (QID) | ORAL | Status: DC | PRN

## 2016-09-04 MED ORDER — INSULIN ASPART 100 UNIT/ML ~~LOC~~ SOLN
0.0000 [IU] | Freq: Three times a day (TID) | SUBCUTANEOUS | Status: DC
  Administered 2016-09-06: 1 [IU] via SUBCUTANEOUS
  Administered 2016-09-06 – 2016-09-07 (×2): 2 [IU] via SUBCUTANEOUS
  Administered 2016-09-08: 1 [IU] via SUBCUTANEOUS
  Administered 2016-09-09 – 2016-09-10 (×2): 2 [IU] via SUBCUTANEOUS
  Administered 2016-09-11: 1 [IU] via SUBCUTANEOUS

## 2016-09-04 MED ORDER — CARVEDILOL 12.5 MG PO TABS
12.5000 mg | ORAL_TABLET | Freq: Two times a day (BID) | ORAL | Status: DC
Start: 2016-09-05 — End: 2016-09-05

## 2016-09-04 NOTE — ED Notes (Signed)
Pt states he is still unable to provide urine sample.

## 2016-09-04 NOTE — ED Provider Notes (Signed)
[facility] DEPT Provider Note   CSN: [id number] Arrival date & time: 09/04/16  1617     History   Chief Complaint Chief Complaint  Patient presents with  . Hematuria    HPI Keith Young is a 66 y.o. male.  Level V caveat for dementia. Apparently blood was noted in his diaper today. Dr. Luan Pulling was notified and he ordered a catheterized urine specimen.  A catheter could not be placed. Patient was sent to the emergency department. He has no specific complaints.      Past Medical History:  Diagnosis Date  . AICD (automatic cardioverter/defibrillator) present   . Alcoholism in remission ([place])   . Anemia   . Aortic insufficiency   . Arthritis   . At moderate risk for fall   . Bell's palsy   . Cardiomyopathy ([place])   . Chronic systolic heart failure (HCC)    NYHA Class III  . CKD (chronic kidney disease) stage 3, GFR 30-59 ml/min   . COPD (chronic obstructive pulmonary disease) ([place])   . Essential hypertension, benign   . Hyperlipidemia   . Noncompliance with medication regimen   . Nonischemic cardiomyopathy (HCC)    EF 15-20%  . NSVT (nonsustained ventricular tachycardia) ([place])   . Numbness of right jaw    Had a stoke in 01/2013. Numbness is occasional, especially when trying to chew.  . Productive cough 08/2013   With brown sputum   . SOB (shortness of breath) on exertion 08/2013  . Stroke ([place]) 01/2013   weakness of right side from CVA  . Type 2 diabetes mellitus ([place])   . Uses hearing aid 2014   recently received new hearing aids    Patient Active Problem List   Diagnosis Date Noted  . Malnutrition of moderate degree 06/28/2016  . Altered mental status 06/26/2016  . CVA (cerebral vascular accident) ([place]) 06/26/2016  . Severe malnutrition ([place]) 06/26/2016  . HCAP (healthcare-associated pneumonia) 06/04/2016  . Mucosal abnormality of stomach   . Gastritis and gastroduodenitis 09/30/2015  . Heme positive stool 09/30/2015  . Pancytopenia ([place]) 09/30/2015    . Chest pain 04/28/2015  . Elevated troponin 04/28/2015  . ICD (implantable cardioverter-defibrillator) in place 01/22/2015  . Anemia 09/30/2014  . Gastric polyp   . Reflux esophagitis   . Elevated LFTs 03/26/2014  . Anemia in chronic kidney disease 03/26/2014  . Carotid bruit 12/18/2013  . Acute on chronic systolic heart failure ([place]) 10/24/2013  . CHF (congestive heart failure), NYHA class III ([place]) 10/23/2013  . Cough 10/23/2013  . CKD (chronic kidney disease) 10/23/2013  . Diabetes mellitus type 2 in nonobese ([place]) 10/23/2013  . Essential hypertension, benign 10/23/2013  . Hyperkalemia 10/23/2013  . CAD (coronary artery disease) 10/23/2013  . History of stroke 10/23/2013  . Anemia of chronic kidney failure 10/23/2013  . Other and unspecified hyperlipidemia 10/23/2013    Past Surgical History:  Procedure Laterality Date  . BIOPSY N/A 04/10/2014   Procedure: GASTRIC BIOPSY;  Surgeon: Daneil Dolin, MD;  Location: [facility] ORS;  Service: Endoscopy;  Laterality: N/A;  . BIOPSY  10/12/2015   Procedure: BIOPSY;  Surgeon: Daneil Dolin, MD;  Location: [facility] ENDO SUITE;  Service: Endoscopy;;  stomach bx's  . CARDIAC DEFIBRILLATOR PLACEMENT  11/12/12   [employer] Inogen MINI ICD implanted in [place] [facility] [place] AVR per Dr Nelly Laurence note  . COLONOSCOPY WITH PROPOFOL N/A 04/10/2014   RMR: Colonic Diverticulosis  .  ESOPHAGOGASTRODUODENOSCOPY (EGD) WITH PROPOFOL N/A 04/10/2014   RMR: Mild erosive reflux esophagitis. Multiple antral polyps likely hyperplastic status post removal by hot snare cautery technique. Diffusely abnormal stomach status post gastric biopsy I suspect some of patients anemaia may be due to intermittent oozing from the stomach. It would be difficult and a risky proposition to attempt complete removal of all of his gastric polyps.  . ESOPHAGOGASTRODUODENOSCOPY (EGD) WITH PROPOFOL N/A 10/12/2015   Procedure:  ESOPHAGOGASTRODUODENOSCOPY (EGD) WITH PROPOFOL;  Surgeon: Daneil Dolin, MD;  Location: [facility] ENDO SUITE;  Service: Endoscopy;  Laterality: N/A;  6734 - moved to 9:15  . POLYPECTOMY N/A 04/10/2014   Procedure: GASTRIC POLYPECTOMY;  Surgeon: Daneil Dolin, MD;  Location: [facility] ORS;  Service: Endoscopy;  Laterality: N/A;  . RIGHT HEART CATHETERIZATION N/A 02/06/2014   Procedure: RIGHT HEART CATH;  Surgeon: Larey Dresser, MD;  Location: [facility] CATH LAB;  Service: Cardiovascular;  Laterality: N/A;       Home Medications    Prior to Admission medications   Medication Sig Start Date End Date Taking? Authorizing Provider  acetaminophen (TYLENOL) 325 MG tablet Take 2 tablets (650 mg total) by mouth every 4 (four) hours as needed for mild pain (or temp > 37.5 C (99.5 F)). 06/30/16   Sinda Du, MD  aspirin 325 MG tablet Take 1 tablet (325 mg total) by mouth daily. 06/30/16   Sinda Du, MD  atorvastatin (LIPITOR) 80 MG tablet Take 80 mg by mouth daily.     Historical Provider, MD  carvedilol (COREG) 25 MG tablet Take 12.5 mg by mouth 2 (two) times daily with a meal.     Historical Provider, MD  Cholecalciferol (VITAMIN D3) 2000 UNITS TABS Take 2,000 mg by mouth daily.    Historical Provider, MD  digoxin (LANOXIN) 0.125 MG tablet TAKE 1/2 TABLET BY MOUTH DAILY 07/01/16   Jolaine Artist, MD  feeding supplement, ENSURE ENLIVE, (ENSURE ENLIVE) LIQD Take 237 mLs by mouth 2 (two) times daily between meals. 06/30/16   Sinda Du, MD  ferrous sulfate 325 (65 FE) MG tablet Take 325 mg by mouth daily with breakfast.    Historical Provider, MD  furosemide (LASIX) 40 MG tablet Take 40 mg by mouth daily.    Historical Provider, MD  isosorbide dinitrate (ISORDIL) 20 MG tablet Take 40 mg by mouth 3 (three) times daily.  09/29/15   Historical Provider, MD  losartan (COZAAR) 25 MG tablet Take 25 mg by mouth daily.    Historical Provider, MD  mirabegron ER (MYRBETRIQ) 25 MG TB24 tablet Take 25 mg by mouth daily.     Historical Provider, MD  mirtazapine (REMERON) 7.5 MG tablet Take 1 tablet by mouth daily. 06/09/16   Historical Provider, MD  Multiple Vitamin (MULTIVITAMIN WITH MINERALS) TABS tablet Take 1 tablet by mouth daily. 06/30/16   Sinda Du, MD  pantoprazole (PROTONIX) 40 MG tablet TAKE ONE TABLET   BY MOUTH   DAILY 05/25/16   Mahala Menghini, PA-C    Family History Family History  Problem Relation Age of Onset  . Kidney disease Mother   . Kidney disease Sister   . Colon cancer Neg Hx     Social History Social History  Substance Use Topics  . Smoking status: Former Smoker    Types: Cigarettes    Quit date: 11/05/1993  . Smokeless tobacco: Never Used  . Alcohol use Yes     Comment: former heavy ETOH, none recently     Allergies  Patient has no known allergies.   Review of Systems Review of Systems  Reason unable to perform ROS: dementia.     Physical Exam Updated Vital Signs BP 131/80   Pulse 78   Temp 97.7 F (36.5 C) (Oral)   Resp 17   Ht 5\' 7"  (1.702 m)   Wt 110 lb (49.9 kg)   SpO2 100%   BMI 17.23 kg/m   Physical Exam  Constitutional:  Pleasant, demented  HENT:  Head: Normocephalic and atraumatic.  Eyes: Conjunctivae are normal.  Neck: Neck supple.  Cardiovascular: Normal rate and regular rhythm.   Pulmonary/Chest: Effort normal and breath sounds normal.  Abdominal: Soft. Bowel sounds are normal.  Genitourinary:  Genitourinary Comments: No obvious bleeding in diaper  Musculoskeletal: Normal range of motion.  Neurological: He is alert.  Skin: Skin is warm and dry.  Psychiatric:  demented  Nursing note and vitals reviewed.    ED Treatments / Results  Labs (all labs ordered are listed, but only abnormal results are displayed) Labs Reviewed  CBC WITH DIFFERENTIAL/PLATELET - Abnormal; Notable for the following:       Result Value   WBC 3.0 (*)    Hemoglobin 11.9 (*)    HCT 36.8 (*)    All other components within normal limits  BASIC METABOLIC  PANEL - Abnormal; Notable for the following:    Sodium 131 (*)    Potassium 6.4 (*)    CO2 19 (*)    Glucose, Bld 123 (*)    BUN 117 (*)    Creatinine, Ser 4.41 (*)    GFR calc non Af Amer 13 (*)    GFR calc Af Amer 15 (*)    All other components within normal limits  URINALYSIS, ROUTINE W REFLEX MICROSCOPIC    EKG  EKG Interpretation  Date/Time:  Sunday September 04 2016 20:30:25 EDT Ventricular Rate:  75 PR Interval:    QRS Duration: 108 QT Interval:  369 QTC Calculation: 413 R Axis:   -90 Text Interpretation:  Sinus rhythm Left anterior fascicular block Probable left ventricular hypertrophy Nonspecific T abnormalities, lateral leads Confirmed by Lacinda Axon  MD, Jazari Ober ([id number]) on 09/04/2016 8:44:11 PM       Radiology No results found.  Procedures Procedures (including critical care time)  Medications Ordered in ED Medications  sodium polystyrene (KAYEXALATE) 15 GM/60ML suspension 30 g (not administered)  sodium chloride 0.9 % bolus 1,000 mL (0 mLs Intravenous Stopped 09/04/16 2045)     Initial Impression / Assessment and Plan / ED Course  I have reviewed the triage vital signs and the nursing notes.  Pertinent labs & imaging results that were available during my care of the patient were reviewed by me and considered in my medical decision making (see chart for details).    CRITICAL CARE Performed by: Nat Christen Total critical care time: 30 minutes Critical care time was exclusive of separately billable procedures and treating other patients. Critical care was necessary to treat or prevent imminent or life-threatening deterioration. Critical care was time spent personally by me on the following activities: development of treatment plan with patient and/or surrogate as well as nursing, discussions with consultants, evaluation of patient's response to treatment, examination of patient, obtaining history from patient or surrogate, ordering and performing treatments and  interventions, ordering and review of laboratory studies, ordering and review of radiographic studies, pulse oximetry and re-evaluation of patient's condition.  Patient is demented and unable to provide a specific history. Patient has several laboratory anomalies.  Creatinine is currently 4.41 and potassium 6.4.  Creatinine 3 weeks ago was 1.53.  Renal failure may be prerenal secondary to dehydration. Will hydrate. Kayexalate. Admit to general medicine.  Final Clinical Impressions(s) / ED Diagnoses   Final diagnoses:  Renal failure, unspecified chronicity  Hyperkalemia    New Prescriptions New Prescriptions   No medications on file     Nat Christen, MD 09/12/16 1327

## 2016-09-04 NOTE — ED Triage Notes (Signed)
Pt sent from the [facility] for evaluation of hematuria.  Nurse states blood noted in diaper and then Dr. Luan Pulling ordered in and out and no urine was obtained.  Sent for evaluation for this.

## 2016-09-04 NOTE — ED Notes (Signed)
Pt made aware of need for urine sample. Pt states that he is unable to provide one at this time, but was given call light and instructed to call this tech or his RN when he is able to provide one.

## 2016-09-04 NOTE — H&P (Addendum)
TRH H&P    Patient Demographics:    Keith Young, is a 66 y.o. male  MRN: [id number]  DOB - 12-01-1950  Admit Date - 09/04/2016  Referring MD/NP/PA: Dr. Lacinda Axon  Outpatient Primary MD for the patient is [name], MD  Patient coming from: Skilled nursing facility  Chief Complaint  Patient presents with  . Hematuria      HPI:    Keith Young  is a 66 y.o. male, With history of diabetes mellitus, stroke with resultant right-sided weakness, hyperlipidemia, hypertension, COPD, stage III chronic kidney disease, chronic systolic CHF, status post AICD who was sent to the ED from skilled nursing facility after patient was found to have blood in the diaper. Dr. Luan Pulling was consulted who recommended to obtain catheterized urine specimen. As catheter could not be placed patient was sent to the ED. Patient has no specific complaints. He denies nausea vomiting or diarrhea. No abdominal pain. No dysuria. No chest pain or shortness of breath.  Lab work in the ED revealed acute on chronic kidney disease with creatinine 4.41, his previous creatinine was 1.53 on 08/08/2016, potassium 6.4, BUNs 117.    Review of systems:      A full 10 point Review of Systems was done, except as stated above, all other Review of Systems were negative.   With Past History of the following :    Past Medical History:  Diagnosis Date  . AICD (automatic cardioverter/defibrillator) present   . Alcoholism in remission ([place])   . Anemia   . Aortic insufficiency   . Arthritis   . At moderate risk for fall   . Bell's palsy   . Cardiomyopathy ([place])   . Chronic systolic heart failure (HCC)    NYHA Class III  . CKD (chronic kidney disease) stage 3, GFR 30-59 ml/min   . COPD (chronic obstructive pulmonary disease) ([place])   . Essential hypertension, benign   . Hyperlipidemia   . Noncompliance with medication regimen   . Nonischemic  cardiomyopathy (HCC)    EF 15-20%  . NSVT (nonsustained ventricular tachycardia) ([place])   . Numbness of right jaw    Had a stoke in 01/2013. Numbness is occasional, especially when trying to chew.  . Productive cough 08/2013   With brown sputum   . SOB (shortness of breath) on exertion 08/2013  . Stroke ([place]) 01/2013   weakness of right side from CVA  . Type 2 diabetes mellitus ([place])   . Uses hearing aid 2014   recently received new hearing aids      Past Surgical History:  Procedure Laterality Date  . BIOPSY N/A 04/10/2014   Procedure: GASTRIC BIOPSY;  Surgeon: Daneil Dolin, MD;  Location: [facility];  Service: Endoscopy;  Laterality: N/A;  . BIOPSY  10/12/2015   Procedure: BIOPSY;  Surgeon: Daneil Dolin, MD;  Location: [facility];  Service: Endoscopy;;  stomach bx's  . CARDIAC DEFIBRILLATOR PLACEMENT  11/12/12   [employer] Inogen MINI ICD implanted in [place] [facility] [place]  bioprostetic AVR per Dr Nelly Laurence note  . COLONOSCOPY WITH PROPOFOL N/A 04/10/2014   RMR: Colonic Diverticulosis  . ESOPHAGOGASTRODUODENOSCOPY (EGD) WITH PROPOFOL N/A 04/10/2014   RMR: Mild erosive reflux esophagitis. Multiple antral polyps likely hyperplastic status post removal by hot snare cautery technique. Diffusely abnormal stomach status post gastric biopsy I suspect some of patients anemaia may be due to intermittent oozing from the stomach. It would be difficult and a risky proposition to attempt complete removal of all of his gastric polyps.  . ESOPHAGOGASTRODUODENOSCOPY (EGD) WITH PROPOFOL N/A 10/12/2015   Procedure: ESOPHAGOGASTRODUODENOSCOPY (EGD) WITH PROPOFOL;  Surgeon: Daneil Dolin, MD;  Location: [facility];  Service: Endoscopy;  Laterality: N/A;  2353 - moved to 9:15  . POLYPECTOMY N/A 04/10/2014   Procedure: GASTRIC POLYPECTOMY;  Surgeon: Daneil Dolin, MD;  Location: [facility];  Service: Endoscopy;  Laterality: N/A;  . RIGHT HEART CATHETERIZATION N/A  02/06/2014   Procedure: RIGHT HEART CATH;  Surgeon: Larey Dresser, MD;  Location: [facility] CATH LAB;  Service: Cardiovascular;  Laterality: N/A;      Social History:      Social History  Substance Use Topics  . Smoking status: Former Smoker    Types: Cigarettes    Quit date: 11/05/1993  . Smokeless tobacco: Never Used  . Alcohol use Yes     Comment: former heavy ETOH, none recently       Family History :     Family History  Problem Relation Age of Onset  . Kidney disease Mother   . Kidney disease Sister   . Colon cancer Neg Hx       Home Medications:   Prior to Admission medications   Medication Sig Start Date End Date Taking? Authorizing Provider  acetaminophen (TYLENOL) 325 MG tablet Take 2 tablets (650 mg total) by mouth every 4 (four) hours as needed for mild pain (or temp > 37.5 C (99.5 F)). 06/30/16   Sinda Du, MD  aspirin 325 MG tablet Take 1 tablet (325 mg total) by mouth daily. 06/30/16   Sinda Du, MD  atorvastatin (LIPITOR) 80 MG tablet Take 80 mg by mouth daily.     Historical Provider, MD  carvedilol (COREG) 25 MG tablet Take 12.5 mg by mouth 2 (two) times daily with a meal.     Historical Provider, MD  Cholecalciferol (VITAMIN D3) 2000 UNITS TABS Take 2,000 mg by mouth daily.    Historical Provider, MD  digoxin (LANOXIN) 0.125 MG tablet TAKE 1/2 TABLET BY MOUTH DAILY 07/01/16   Jolaine Artist, MD  feeding supplement, ENSURE ENLIVE, (ENSURE ENLIVE) LIQD Take 237 mLs by mouth 2 (two) times daily between meals. 06/30/16   Sinda Du, MD  ferrous sulfate 325 (65 FE) MG tablet Take 325 mg by mouth daily with breakfast.    Historical Provider, MD  furosemide (LASIX) 40 MG tablet Take 40 mg by mouth daily.    Historical Provider, MD  isosorbide dinitrate (ISORDIL) 20 MG tablet Take 40 mg by mouth 3 (three) times daily.  09/29/15   Historical Provider, MD  losartan (COZAAR) 25 MG tablet Take 25 mg by mouth daily.    Historical Provider, MD  mirabegron ER  (MYRBETRIQ) 25 MG TB24 tablet Take 25 mg by mouth daily.    Historical Provider, MD  mirtazapine (REMERON) 7.5 MG tablet Take 1 tablet by mouth daily. 06/09/16   Historical Provider, MD  Multiple Vitamin (MULTIVITAMIN WITH MINERALS) TABS tablet Take 1 tablet by mouth daily. 06/30/16  Sinda Du, MD  pantoprazole (PROTONIX) 40 MG tablet TAKE ONE TABLET   BY MOUTH   DAILY 05/25/16   Mahala Menghini, PA-C     Allergies:    No Known Allergies   Physical Exam:   Vitals  Blood pressure 138/83, pulse 77, temperature 97.7 F (36.5 C), temperature source Oral, resp. rate 17, height 5\' 7"  (1.702 m), weight 49.9 kg (110 lb), SpO2 100 %.  1.  General: Appears in no acute distress  2. Psychiatric:  Intact judgement and  insight, awake alert, oriented x 3.  3. Neurologic: No focal neurological deficits, all cranial nerves intact.Strength 5/5 all 4 extremities, sensation intact all 4 extremities, plantars down going.  4. Eyes :  anicteric sclerae, moist conjunctivae with no lid lag. PERRLA.  5. ENMT:  Oropharynx clear with moist mucous membranes and good dentition  6. Neck:  supple, no cervical lymphadenopathy appriciated, No thyromegaly  7. Respiratory : Normal respiratory effort, good air movement bilaterally,clear to  auscultation bilaterally  8. Cardiovascular : RRR, no gallops, rubs or murmurs, no leg edema  9. Gastrointestinal:  Positive bowel sounds, abdomen soft, non-tender to palpation,no hepatosplenomegaly, no rigidity or guarding       10. Skin:  No cyanosis, normal texture and turgor, no rash, lesions or ulcers  11.Musculoskeletal:  Good muscle tone,  joints appear normal , no effusions,  normal range of motion    Data Review:    CBC  Recent Labs Lab 08/29/16 0700 09/04/16 1910  WBC 2.9* 3.0*  HGB 11.7* 11.9*  HCT 36.8* 36.8*  PLT 184 179  MCV 86.0 85.2  MCH 27.3 27.5  MCHC 31.8 32.3  RDW 14.8 14.6  LYMPHSABS 0.6* 0.7  MONOABS 0.4 0.4  EOSABS 0.1 0.1   BASOSABS 0.0 0.0   ------------------------------------------------------------------------------------------------------------------  Chemistries   Recent Labs Lab 09/04/16 1910  NA 131*  K 6.4*  CL 104  CO2 19*  GLUCOSE 123*  BUN 117*  CREATININE 4.41*  CALCIUM 9.1   ------------------------------------------------------------------------------------------------------------------  ------------------------------------------------------------------------------------------------------------------  --------------------------------------------------------------------------------------------------------------- Urine analysis:    Component Value Date/Time   COLORURINE YELLOW 09/04/2016 2053   APPEARANCEUR CLOUDY (A) 09/04/2016 2053   LABSPEC 1.011 09/04/2016 2053   PHURINE 5.0 09/04/2016 2053   GLUCOSEU NEGATIVE 09/04/2016 2053   HGBUR LARGE (A) 09/04/2016 2053   BILIRUBINUR NEGATIVE 09/04/2016 2053   KETONESUR NEGATIVE 09/04/2016 2053   PROTEINUR 30 (A) 09/04/2016 2053   UROBILINOGEN 0.2 10/23/2013 1545   NITRITE NEGATIVE 09/04/2016 2053   LEUKOCYTESUR LARGE (A) 09/04/2016 2053      Imaging Results:    No results found.  My personal review of EKG: Rhythm NSR   Assessment & Plan:    Active Problems:   Diabetes mellitus type 2 in nonobese [facility]   Essential hypertension, benign   Hyperkalemia   Severe malnutrition (HCC)   AKI (acute kidney injury) ([place])   1. Acute kidney injury on CAD stage III- patient presented with elevated BUN/creatinine of 117/4.41, previous BUN/creatinine on 08/08/2016 was 62/1.53. Likely prerenal, as patient is on Lasix and Cozaar. Will hold both Lasix and Cozaar at this time. Will check urine analysis, urine osmolality, urine sodium, urine creatinine to differentiate prerenal versus ATN. We will also obtain renal ultrasound to rule out obstructive cause as patient also has hematuria. Will insert Foley catheter. 2. Hyperkalemia- potassium  is 6.4, patient given Kayexalate in the ED. Will continue with IV fluids normal saline at 75 ML per hour. Hold Cozaar. Follow BMP in a.m.  3. History of systolic congestive heart failure- currently compensated, hold Lasix. Patient is also on digoxin, will obtain the digoxin level. 4. Hypertension-blood pressure stable, continue Coreg. Hold Cozaar as above. 5. Diabetes mellitus-patient not on any oral hypoglycemic agents, will obtain hemoglobin A1c. Start sliding scale insulin with NovoLog. 6. Hematuria- unclear cause at this time, UA shows hematuria, negative nitrite, many bacteria.Will obtain urine culture,  Renal ultrasound. Foley catheter will be inserted. 7. Hyperlipidemia- continue atorvastatin.   DVT Prophylaxis-    SCDs   AM Labs Ordered, also please review Full Orders  Family Communication: Admission, patients condition and plan of care including tests being ordered have been discussed with the patient  who indicate understanding and agree with the plan and Code Status.  Code Status:  DO NOT RESUSCITATE  Admission status: Observation    Time spent in minutes : 60 minutes   Berry Godsey S M.D on 09/04/2016 at 9:19 PM  Between 7am to 7pm - Pager - [phone]. After 7pm go to [email or web address] - password [facility]  [facility] - Office  [phone]

## 2016-09-04 NOTE — ED Notes (Signed)
Informed [name], [name], LPN that patient is being admitted for acute kidney injury.

## 2016-09-04 NOTE — ED Notes (Signed)
CRITICAL VALUE ALERT  Critical value received:  Potassium 6.4 Date of notification:  09/04/2016  Time of notification:  20:04  Critical value read back: yes  Nurse who received alert:  Rip Harbour RN   MD notified (1st page):  Dr Lacinda Axon  Time of first page:  20:05  MD notified (2nd page):  Time of second page:  Responding MD:  Dr Lacinda Axon  Time MD responded:  20:05

## 2016-09-05 ENCOUNTER — Encounter (HOSPITAL_COMMUNITY): Payer: Self-pay | Admitting: Primary Care

## 2016-09-05 ENCOUNTER — Inpatient Hospital Stay (HOSPITAL_COMMUNITY): Payer: Medicare HMO

## 2016-09-05 ENCOUNTER — Observation Stay (HOSPITAL_COMMUNITY): Payer: Medicare HMO

## 2016-09-05 DIAGNOSIS — Z7189 Other specified counseling: Secondary | ICD-10-CM

## 2016-09-05 DIAGNOSIS — Z681 Body mass index (BMI) 19 or less, adult: Secondary | ICD-10-CM | POA: Diagnosis not present

## 2016-09-05 DIAGNOSIS — E785 Hyperlipidemia, unspecified: Secondary | ICD-10-CM | POA: Diagnosis present

## 2016-09-05 DIAGNOSIS — N390 Urinary tract infection, site not specified: Secondary | ICD-10-CM | POA: Diagnosis present

## 2016-09-05 DIAGNOSIS — N183 Chronic kidney disease, stage 3 (moderate): Secondary | ICD-10-CM | POA: Diagnosis present

## 2016-09-05 DIAGNOSIS — I428 Other cardiomyopathies: Secondary | ICD-10-CM | POA: Diagnosis present

## 2016-09-05 DIAGNOSIS — K7460 Unspecified cirrhosis of liver: Secondary | ICD-10-CM | POA: Diagnosis present

## 2016-09-05 DIAGNOSIS — R319 Hematuria, unspecified: Secondary | ICD-10-CM | POA: Diagnosis present

## 2016-09-05 DIAGNOSIS — J449 Chronic obstructive pulmonary disease, unspecified: Secondary | ICD-10-CM | POA: Diagnosis present

## 2016-09-05 DIAGNOSIS — E875 Hyperkalemia: Secondary | ICD-10-CM | POA: Diagnosis present

## 2016-09-05 DIAGNOSIS — Z95810 Presence of automatic (implantable) cardiac defibrillator: Secondary | ICD-10-CM | POA: Diagnosis present

## 2016-09-05 DIAGNOSIS — R278 Other lack of coordination: Secondary | ICD-10-CM | POA: Diagnosis present

## 2016-09-05 DIAGNOSIS — E43 Unspecified severe protein-calorie malnutrition: Secondary | ICD-10-CM | POA: Diagnosis present

## 2016-09-05 DIAGNOSIS — I69351 Hemiplegia and hemiparesis following cerebral infarction affecting right dominant side: Secondary | ICD-10-CM | POA: Diagnosis not present

## 2016-09-05 DIAGNOSIS — Z515 Encounter for palliative care: Secondary | ICD-10-CM

## 2016-09-05 DIAGNOSIS — E871 Hypo-osmolality and hyponatremia: Secondary | ICD-10-CM | POA: Diagnosis present

## 2016-09-05 DIAGNOSIS — Z87891 Personal history of nicotine dependence: Secondary | ICD-10-CM | POA: Diagnosis not present

## 2016-09-05 DIAGNOSIS — E1122 Type 2 diabetes mellitus with diabetic chronic kidney disease: Secondary | ICD-10-CM | POA: Diagnosis present

## 2016-09-05 DIAGNOSIS — B20 Human immunodeficiency virus [HIV] disease: Secondary | ICD-10-CM | POA: Diagnosis present

## 2016-09-05 DIAGNOSIS — E872 Acidosis: Secondary | ICD-10-CM | POA: Diagnosis not present

## 2016-09-05 DIAGNOSIS — I639 Cerebral infarction, unspecified: Secondary | ICD-10-CM | POA: Diagnosis present

## 2016-09-05 DIAGNOSIS — E860 Dehydration: Secondary | ICD-10-CM | POA: Diagnosis present

## 2016-09-05 DIAGNOSIS — R7881 Bacteremia: Secondary | ICD-10-CM | POA: Diagnosis present

## 2016-09-05 DIAGNOSIS — N170 Acute kidney failure with tubular necrosis: Secondary | ICD-10-CM | POA: Diagnosis present

## 2016-09-05 DIAGNOSIS — R64 Cachexia: Secondary | ICD-10-CM | POA: Diagnosis not present

## 2016-09-05 DIAGNOSIS — I5022 Chronic systolic (congestive) heart failure: Secondary | ICD-10-CM | POA: Diagnosis present

## 2016-09-05 DIAGNOSIS — N179 Acute kidney failure, unspecified: Secondary | ICD-10-CM | POA: Diagnosis not present

## 2016-09-05 DIAGNOSIS — F0390 Unspecified dementia without behavioral disturbance: Secondary | ICD-10-CM | POA: Diagnosis present

## 2016-09-05 DIAGNOSIS — Z66 Do not resuscitate: Secondary | ICD-10-CM | POA: Diagnosis present

## 2016-09-05 DIAGNOSIS — D631 Anemia in chronic kidney disease: Secondary | ICD-10-CM | POA: Diagnosis present

## 2016-09-05 DIAGNOSIS — R262 Difficulty in walking, not elsewhere classified: Secondary | ICD-10-CM | POA: Diagnosis present

## 2016-09-05 DIAGNOSIS — I130 Hypertensive heart and chronic kidney disease with heart failure and stage 1 through stage 4 chronic kidney disease, or unspecified chronic kidney disease: Secondary | ICD-10-CM | POA: Diagnosis present

## 2016-09-05 DIAGNOSIS — R1312 Dysphagia, oropharyngeal phase: Secondary | ICD-10-CM | POA: Diagnosis present

## 2016-09-05 LAB — GLUCOSE, CAPILLARY
GLUCOSE-CAPILLARY: 69 mg/dL (ref 65–99)
GLUCOSE-CAPILLARY: 75 mg/dL (ref 65–99)
GLUCOSE-CAPILLARY: 76 mg/dL (ref 65–99)
Glucose-Capillary: 87 mg/dL (ref 65–99)
Glucose-Capillary: 151 mg/dL — ABNORMAL HIGH (ref 65–99)

## 2016-09-05 LAB — COMPREHENSIVE METABOLIC PANEL
ALT: 52 U/L (ref 17–63)
AST: 41 U/L (ref 15–41)
Albumin: 2.5 g/dL — ABNORMAL LOW (ref 3.5–5.0)
Alkaline Phosphatase: 65 U/L (ref 38–126)
Anion gap: 7 (ref 5–15)
BUN: 124 mg/dL — AB (ref 6–20)
CHLORIDE: 107 mmol/L (ref 101–111)
CO2: 21 mmol/L — AB (ref 22–32)
CREATININE: 3.71 mg/dL — AB (ref 0.61–1.24)
Calcium: 8.6 mg/dL — ABNORMAL LOW (ref 8.9–10.3)
GFR, EST AFRICAN AMERICAN: 18 mL/min — AB (ref >60)
GFR, EST NON AFRICAN AMERICAN: 16 mL/min — AB (ref >60)
Glucose, Bld: 88 mg/dL (ref 65–99)
POTASSIUM: 4.8 mmol/L (ref 3.5–5.1)
Sodium: 135 mmol/L (ref 135–145)
Total Bilirubin: 0.2 mg/dL — ABNORMAL LOW (ref 0.3–1.2)
Total Protein: 7.5 g/dL (ref 6.5–8.1)

## 2016-09-05 LAB — CBC
HCT: 33.7 % — ABNORMAL LOW (ref 39.0–52.0)
Hemoglobin: 10.9 g/dL — ABNORMAL LOW (ref 13.0–17.0)
MCH: 27.4 pg (ref 26.0–34.0)
MCHC: 32.3 g/dL (ref 30.0–36.0)
MCV: 84.7 fL (ref 78.0–100.0)
PLATELETS: 190 10*3/uL (ref 150–400)
RBC: 3.98 MIL/uL — ABNORMAL LOW (ref 4.22–5.81)
RDW: 14.6 % (ref 11.5–15.5)
WBC: 3.1 10*3/uL — AB (ref 4.0–10.5)

## 2016-09-05 LAB — SODIUM, URINE, RANDOM: SODIUM UR: 61 mmol/L

## 2016-09-05 LAB — DIGOXIN LEVEL: DIGOXIN LVL: 0.6 ng/mL — AB (ref 0.8–2.0)

## 2016-09-05 LAB — OSMOLALITY, URINE: Osmolality, Ur: 381 mOsm/kg (ref 300–900)

## 2016-09-05 LAB — CREATININE, URINE, RANDOM: Creatinine, Urine: 64.5 mg/dL

## 2016-09-05 MED ORDER — ISOSORBIDE DINITRATE 20 MG PO TABS
10.0000 mg | ORAL_TABLET | Freq: Three times a day (TID) | ORAL | Status: DC
  Administered 2016-09-06 – 2016-09-12 (×18): 10 mg via ORAL
  Filled 2016-09-05 (×20): qty 1

## 2016-09-05 MED ORDER — CARVEDILOL 3.125 MG PO TABS
3.1250 mg | ORAL_TABLET | Freq: Two times a day (BID) | ORAL | Status: DC
  Administered 2016-09-06 – 2016-09-12 (×12): 3.125 mg via ORAL
  Filled 2016-09-05 (×12): qty 1

## 2016-09-05 MED ORDER — ACETAMINOPHEN 650 MG RE SUPP
650.0000 mg | Freq: Four times a day (QID) | RECTAL | Status: DC | PRN
  Filled 2016-09-05 (×3): qty 1

## 2016-09-05 MED ORDER — PIPERACILLIN SOD-TAZOBACTAM SO 2.25 (2-0.25) G IV SOLR
2.2500 g | Freq: Three times a day (TID) | INTRAVENOUS | Status: DC
  Administered 2016-09-05 – 2016-09-06 (×3): 2.25 g via INTRAVENOUS
  Filled 2016-09-05 (×7): qty 2.25

## 2016-09-05 MED ORDER — HYDROMORPHONE HCL 1 MG/ML IJ SOLN
0.5000 mg | Freq: Once | INTRAMUSCULAR | Status: AC
  Administered 2016-09-05: 0.5 mg via INTRAVENOUS
  Filled 2016-09-05: qty 1

## 2016-09-05 MED ORDER — VANCOMYCIN HCL 500 MG IV SOLR
500.0000 mg | INTRAVENOUS | Status: DC
  Filled 2016-09-05: qty 500

## 2016-09-05 MED ORDER — VANCOMYCIN HCL IN DEXTROSE 1-5 GM/200ML-% IV SOLN
1000.0000 mg | Freq: Once | INTRAVENOUS | Status: AC
  Administered 2016-09-05: 1000 mg via INTRAVENOUS
  Filled 2016-09-05: qty 200

## 2016-09-05 MED ORDER — DEXTROSE-NACL 5-0.9 % IV SOLN
INTRAVENOUS | Status: DC
  Administered 2016-09-05 – 2016-09-07 (×4): via INTRAVENOUS

## 2016-09-05 NOTE — Progress Notes (Signed)
Initial Nutrition Assessment  DOCUMENTATION CODES:   Severe malnutrition in context of chronic illness (multiple co-morbities and acute sepsis)- severe muscle and fat depletions   Underweight  INTERVENTION:   Downgrade diet to Purred Nectar thick liquids pending ST evaluation  Magic cup TID with meals, each supplement provides 290 kcal and 9 grams of protein   Mighty Shake II (Nectar-thick) with each meal   RD will continue to follow    NUTRITION DIAGNOSIS:   Malnutrition (severe) related to acute illness, chronic illness (multiple co-morbiditites, ) as evidenced by severe depletion of body fat, severe depletion of muscle mass, meal completion < 25%.   GOAL:  Pt to meet >/= 90% of their estimated nutrition needs if congruent with the patients healthcare goals. Palliative medicine consulted-pending.     MONITOR:   Diet advancement, PO intake, Weight trends, Labs, Supplement acceptance  REASON FOR ASSESSMENT:   Low Braden    ASSESSMENT:  The patient is an underweight 66 yo male who presents from SNF with hx of severe malnutrition, CHF, COPD, Stroke, CKD-3, DM type 2, Alcholism.  He was febrile temp (104) and remains on a cooling blanket. He has not eaten since admission. Discussed with nursing who says pt is too lethargic at this time to feed. He will have ST swallow screen first?  The patient usual diet is Pureed with Nectar thick liquids. His po has been poor since admission ot SNF and the bulk of his nutrition intake has been through oral supplements. ST has been following pt closely and multiple interventions, incluiding identifying food preferences, tolerances, supplements which have been attempted to help increase his caloric intake.  His weight has decreased significantly 7% over the past 2 months which I expect is multifacored both poor intake and chronic conditions CKD, CHF.  Nutrition-Focused physical exam completed. Findings are severe temporal, orbital, thoracic fat  depletion, severe clavicle, acromion, scapular muscle depletion, and no edema.       Recent Labs Lab 09/04/16 1910 09/05/16 0542  NA 131* 135  K 6.4* 4.8  CL 104 107  CO2 19* 21*  BUN 117* 124*  CREATININE 4.41* 3.71*  CALCIUM 9.1 8.6*  GLUCOSE 123* 88   Labs: BUN 124 and Cr 3.71  Diet Order:  Diet Heart Room service appropriate? Yes; Fluid consistency: Thin  Skin:  Reviewed, no issues  Last BM:  09/04/16  Height:   Ht Readings from Last 1 Encounters:  09/04/16 5\' 7"  (1.702 m)    Weight:   Wt Readings from Last 1 Encounters:  09/04/16 103 lb 12.8 oz (47.1 kg)    Ideal Body Weight:  67 kg  BMI:  Body mass index is 16.26 kg/m.  Estimated Nutritional Needs:   Kcal:  1645-1880   (35-40 kcal/kg)  Protein:  38-42 gr  (0.8-0.9 gr/kg due to CKD-3)  Fluid:  1.2 liters daily (25 ml/kg) CHF  EDUCATION NEEDS:   No education needs identified at this time  Colman Cater MS,RD,CSG,LDN Office: [phone] Pager: [phone]

## 2016-09-05 NOTE — Consult Note (Signed)
Consultation Note Date: 09/05/2016   Patient Name: Keith Young  DOB: November 20, 1950  MRN: [id number]  Age / Sex: 66 y.o., male  PCP: Sinda Du, MD Referring Physician: Sinda Du, MD  Reason for Consultation: Establishing goals of care and Psychosocial/spiritual support  HPI/Patient Profile: 66 y.o. male  with past medical history of Stroke with right-sided weakness, PNC since January 2018 (left a group home of 2 years due to being non-ambulatory), high blood pressure and high cholesterol, COPD, stage III chronic kidney disease with anemia of chronic disease with Aranesp injections Q2 weeks, chronic systolic CHF class III, alcoholism in remission,, status post AICD admitted on 09/04/2016 with Acute kidney injury on chronic kidney disease stage III.   Clinical Assessment and Goals of Care: [name] is resting quietly in bed. He is nonverbal at this time, he briefly makes but does not keep eye contact. He is unable to communicate even his basic needs at this time. There is no family of bedside at this time.  Call to brother in law, Keith Young. [name] states that [name] was living in [place] until about 2 and half years ago when they brought him to their group home here in [place]. [name] states that [name] has some children, but they live in [place], and [name] sister, Keith Young and Keith Young usually make health care decisions for him.   We talk about [name] fever (since reduced), his labs with low white blood sales, and hemoglobin of 10.9. [name] states that [name] has Aranesp injection Q2 weeks. Last was 08/17/16.  I share my worry over [name] 3 hospitalizations in the last 6 months. I share that he is very ill. [name] plans for a family meeting with patience sister tomorrow 4/10 at 1300. We will discuss returning to [facility] with the benefits of  hospice during tomorrow's meeting.  Healthcare power of attorney NEXT OF KIN - spoke with brother-in-law, Keith Young. There is no legal healthcare power of attorney. He states that Keith Young has children who live in [place]. He states that they call and come to see him occasionally, but sister Keith Young and her husband Keith Young usually make health care decisions for [name].    SUMMARY OF RECOMMENDATIONS   continue to treat the treatable but no extraordinary measures such as CPR or intubation.  Code Status/Advance Care Planning:  DNR - confirmed with brother-in-law Keith Young today.   Symptom Management:   per Dr. Luan Pulling, no additional needs at this time.  Palliative Prophylaxis:   Aspiration and Turn Reposition  Additional Recommendations (Limitations, Scope, Preferences):  Continue to treat the treatable but no extraordinary measures such as CPR or intubation  Psycho-social/Spiritual:   Desire for further Chaplaincy support:no  Additional Recommendations: Education on Hospice  Prognosis:   < 3 months, would not be surprising based on 3 hospitalizations in the last 6 months, malnutrition with weight of 103 pounds, frailty, immobility.  Discharge Planning: To be determined, likely to return to  [facility] with Medicaid bed residential, hopefully with the benefits of hospice. (Hospice not discussed with family yet)      Primary Diagnoses: Present on Admission: . AKI (acute kidney injury) ([place]) . Severe malnutrition ([place]) . Hyperkalemia . Essential hypertension, benign   I have reviewed the medical record, interviewed the patient and family, and examined the patient. The following aspects are pertinent.  Past Medical History:  Diagnosis Date  . AICD (automatic cardioverter/defibrillator) present   . Alcoholism in remission ([place])   . Anemia   . Aortic insufficiency   . Arthritis   . At moderate risk for fall   . Bell's palsy   . Cardiomyopathy ([place])   .  Chronic systolic heart failure (HCC)    NYHA Class III  . CKD (chronic kidney disease) stage 3, GFR 30-59 ml/min   . COPD (chronic obstructive pulmonary disease) ([place])   . Essential hypertension, benign   . Hyperlipidemia   . Noncompliance with medication regimen   . Nonischemic cardiomyopathy (HCC)    EF 15-20%  . NSVT (nonsustained ventricular tachycardia) ([facility])   . Numbness of right jaw    Had a stoke in 01/2013. Numbness is occasional, especially when trying to chew.  . Productive cough 08/2013   With brown sputum   . SOB (shortness of breath) on exertion 08/2013  . Stroke ([place]) 01/2013   weakness of right side from CVA  . Type 2 diabetes mellitus ([place])   . Uses hearing aid 2014   recently received new hearing aids   Social History   Social History  . Marital status: Single    Spouse name: N/A  . Number of children: N/A  . Years of education: N/A   Occupational History  . disabled    Social History Main Topics  . Smoking status: Former Smoker    Types: Cigarettes    Quit date: 11/05/1993  . Smokeless tobacco: Never Used  . Alcohol use Yes     Comment: former heavy ETOH, none recently  . Drug use: No     Comment: prior history of cocaine use, smoking   . Sexual activity: Yes    Birth control/ protection: None   Other Topics Concern  . None   Social History Narrative   Recently moved from [place] to be with family in [place] (2015).  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  Has h/o polysubstance abuse.  Baseline - able to ambulate, mild cognitive delay, able to toilet and shower himself, occasional use of walker/cane.               Family History  Problem Relation Age of Onset  . Kidney disease Mother   . Kidney disease Sister   . Colon cancer Neg Hx    Scheduled Meds: . atorvastatin  80 mg Oral Daily  . carvedilol  3.125 mg Oral BID WC  . insulin aspart  0-9 Units Subcutaneous TID WC  . isosorbide dinitrate  10 mg Oral TID  .  mirabegron ER  25 mg Oral Daily  . mirtazapine  7.5 mg Oral Daily  . pantoprazole  40 mg Oral Daily  . piperacillin-tazobactam (ZOSYN)  IV  2.25 g Intravenous Q8H  . vancomycin  1,000 mg Intravenous Once   Followed by  . [START ON 09/07/2016] vancomycin  500 mg Intravenous Q48H   Continuous Infusions: . dextrose 5 % and 0.9% NaCl 75 mL/hr at 09/05/16 1133   PRN Meds:.acetaminophen, ondansetron **OR**  ondansetron (ZOFRAN) IV Medications Prior to Admission:  Prior to Admission medications   Medication Sig Start Date End Date Taking? Authorizing Provider  acetaminophen (TYLENOL) 325 MG tablet Take 2 tablets (650 mg total) by mouth every 4 (four) hours as needed for mild pain (or temp > 37.5 C (99.5 F)). 06/30/16  Yes Sinda Du, MD  aspirin 325 MG tablet Take 1 tablet (325 mg total) by mouth daily. 06/30/16  Yes Sinda Du, MD  atorvastatin (LIPITOR) 80 MG tablet Take 80 mg by mouth daily.    Yes Historical Provider, MD  carvedilol (COREG) 25 MG tablet Take 12.5 mg by mouth 2 (two) times daily with a meal.    Yes Historical Provider, MD  Cholecalciferol (VITAMIN D3) 2000 UNITS TABS Take 2,000 mg by mouth daily.   Yes Historical Provider, MD  digoxin (LANOXIN) 0.125 MG tablet TAKE 1/2 TABLET BY MOUTH DAILY 07/01/16  Yes Jolaine Artist, MD  feeding supplement, ENSURE ENLIVE, (ENSURE ENLIVE) LIQD Take 237 mLs by mouth 2 (two) times daily between meals. 06/30/16  Yes Sinda Du, MD  ferrous sulfate 325 (65 FE) MG tablet Take 325 mg by mouth daily with breakfast.   Yes Historical Provider, MD  furosemide (LASIX) 40 MG tablet Take 40 mg by mouth daily.   Yes Historical Provider, MD  isosorbide dinitrate (ISORDIL) 20 MG tablet Take 40 mg by mouth 3 (three) times daily.  09/29/15  Yes Historical Provider, MD  mirabegron ER (MYRBETRIQ) 25 MG TB24 tablet Take 25 mg by mouth daily.   Yes Historical Provider, MD  mirtazapine (REMERON) 7.5 MG tablet Take 1 tablet by mouth daily. 06/09/16  Yes  Historical Provider, MD  Multiple Vitamin (MULTIVITAMIN WITH MINERALS) TABS tablet Take 1 tablet by mouth daily. 06/30/16  Yes Sinda Du, MD  pantoprazole (PROTONIX) 40 MG tablet TAKE ONE TABLET   BY MOUTH   DAILY 05/25/16  Yes Mahala Menghini, PA-C   No Known Allergies Review of Systems  Unable to perform ROS: Acuity of condition    Physical Exam  Constitutional:  Frail and thin, severe muscle wasting, temporal wasting, makes but does not keep eye contact  HENT:  Head: Normocephalic and atraumatic.  Severe temporal wasting  Cardiovascular: Regular rhythm.   Rate 120s to 130s  Pulmonary/Chest: Effort normal. No respiratory distress.  Abdominal: Soft. He exhibits no distension.  Musculoskeletal: He exhibits no edema.  Severe muscle wasting  Neurological:  Nonverbal at this time, makes but does not keep eye contact  Skin: Skin is warm and dry.  Nursing note and vitals reviewed.   Vital Signs: BP 104/68 (BP Location: Right Arm)   Pulse (!) 120   Temp 98.1 F (36.7 C) (Rectal)   Resp (!) 30   Ht 5\' 7"  (1.702 m)   Wt 47.1 kg (103 lb 12.8 oz)   SpO2 100%   BMI 16.26 kg/m  Pain Assessment: No/denies pain   Pain Score: Asleep   SpO2: SpO2: 100 % O2 Device:SpO2: 100 % O2 Flow Rate: .   IO: Intake/output summary:  Intake/Output Summary (Last 24 hours) at 09/05/16 1431 Last data filed at 09/05/16 0520  Gross per 24 hour  Intake          1376.25 ml  Output               50 ml  Net          1326.25 ml    LBM: Last BM Date: 09/04/16 Baseline Weight: Weight: 49.9  kg (110 lb) Most recent weight: Weight: 47.1 kg (103 lb 12.8 oz)     Palliative Assessment/Data:   Flowsheet Rows     Most Recent Value  Intake Tab  Referral Department  Pulmonary  Unit at Time of Referral  Med/Surg Unit  Palliative Care Primary Diagnosis  Cardiac  Date Notified  09/05/16  Palliative Care Type  New Palliative care  Reason for referral  Clarify Goals of Care  Date of Admission   09/04/16  Date first seen by Palliative Care  09/05/16  # of days Palliative referral response time  0 Day(s)  # of days IP prior to Palliative referral  1  Clinical Assessment  Palliative Performance Scale Score  20%  Pain Max last 24 hours  Not able to report  Pain Min Last 24 hours  Not able to report  Dyspnea Max Last 24 Hours  Not able to report  Dyspnea Min Last 24 hours  Not able to report  Psychosocial & Spiritual Assessment  Palliative Care Outcomes  Patient/Family meeting held?  Yes  Who was at the meeting?  with brother in law, Keith Young via phone, face to face sched for 4/10 at 1300  Palliative Care Outcomes  Provided psychosocial or spiritual support, Clarified goals of care  Patient/Family wishes: Interventions discontinued/not started   Mechanical Ventilation  Palliative Care follow-up planned  -- [Follow-up while at [facility]]      Time In: 1150 Time Out: 1225 Time Total: 35 minutes Greater than 50%  of this time was spent counseling and coordinating care related to the above assessment and plan.  Signed by: Drue Novel, NP   Please contact Palliative Medicine Team phone at [phone] for questions and concerns.  For individual provider: See Shea Evans

## 2016-09-05 NOTE — Progress Notes (Signed)
Keith Young, Keith Young               ACCOUNT NO.:  [id number]  MEDICAL RECORD NO.:  [id number]  LOCATION:  APA07                         FACILITY:  [facility]  PHYSICIAN:  Kalai Baca L. Luan Pulling, M.D.DATE OF BIRTH:  [birth year]  DATE OF PROCEDURE:  09/03/2016 DATE OF DISCHARGE:                                PROGRESS NOTE   SUBJECTIVE:  Keith Young continues to have increasing problems.  He has been in skilled care facility for about 2 months.  He has been having increasing problems with his blood pressure.  He has had multiple falls. He is not eating well.  He had a previous stroke.  He has been working with physical therapy, but is now making minimal progress.  He is known to have heart failure with a 15-20% ejection fraction; nonsustained ventricular tachycardia resulting in AICD placement; chronic kidney disease stage 3 to 4; COPD; hypertension; previous history of alcoholism, which is in remission; significant smoking history, which is in remission; history of stroke.  I found him sitting in the hallway in a wheelchair.  He responds, but is slow.  He looks weak.  He has no specific complaints.  No chest pain, nausea, vomiting, diarrhea.  He has had multiple falls and I have been backing off on his blood pressure medication because his blood pressure has been low.  I reviewed his medications in the [facility].  PHYSICAL EXAMINATION:  As mentioned, CONSTITUTIONAL:  He is sitting in a wheelchair.  He is awake, but more sluggish than previously. MOUTH:  His mucous membranes are somewhat dry. NECK:  Supple. CARDIOVASCULAR:  His heart is regular with normal heart sounds.  I do not hear a gallop.  He does have a systolic heart murmur.  He does not have any edema. RESPIRATORY:  His respiratory effort is normal and his lungs are clear. GASTROINTESTINAL:  His abdomen is soft with no masses. MUSCULOSKELETAL:  He is weak.  He is in a wheelchair. NEUROLOGICAL:  He has some mild weakness of his right side  from previous stroke. PSYCHIATRIC:  More subdued than usual, but does not appear overtly depressed.  ASSESSMENT:  He seems to be having a gradual downhill course.  He has multiple severe medical problems and he has been in the skilled care facility for rehabilitation, but he is continuing to have worsening of his situation.  I am going to go ahead and see if we can get palliative care to see him at the nursing center, I am not sure if they can do that, but it may be that he is becoming more appropriate for hospice rather than his current level of care.  I just cut his blood pressure medication 1 day ago, so we will see how he does with that and see if he is still having trouble with multiple falls.  ADDENDUM:  After I left the skilled care facility, his nurse called me and said that he was having bloody urine.  I have sent urine for culture and sensitivity and for urinalysis.  I did not initiate treatment until we see what that looks like.     Leitha Hyppolite L. Luan Pulling, M.D.     ELH/MEDQ  D:  09/04/2016  T:  09/05/2016  Job:  [id number]

## 2016-09-05 NOTE — Consult Note (Signed)
Urology Consult   Physician requesting consult: Dr. Velvet Bathe  Reason for consult: Difficult urethral catheterization  History of Present Illness: Keith Young is a 66 y.o. who was admitted for afebrile urinary tract infection.  Multiple attempts were made to place a urethral catheter and were unsuccessful in that with resistance near the prostatic urethra.  He subsequently developed hematuria after multiple catheter attempts indicating possible trauma.  No further attempts were made.  He has been voiding but is incontinent.  It is unclear whether he is incontinent at baseline.  He underwent a renal ultrasound and bladder ultrasound today indicating no hydronephrosis and no distention of the bladder.  Upon admission, he was also noted to have an elevated creatinine over 4 with a baseline of approximately 1.58. This is starting to decrease status post medical intervention.  He is unable to provide adequatehistory as she is unable to answer questions appropriately.  Past Medical History:  Diagnosis Date  . AICD (automatic cardioverter/defibrillator) present   . Alcoholism in remission ([place])   . Anemia   . Aortic insufficiency   . Arthritis   . At moderate risk for fall   . Bell's palsy   . Cardiomyopathy ([place])   . Chronic systolic heart failure (HCC)    NYHA Class III  . CKD (chronic kidney disease) stage 3, GFR 30-59 ml/min   . COPD (chronic obstructive pulmonary disease) ([place])   . Essential hypertension, benign   . Hyperlipidemia   . Noncompliance with medication regimen   . Nonischemic cardiomyopathy (HCC)    EF 15-20%  . NSVT (nonsustained ventricular tachycardia) ([place])   . Numbness of right jaw    Had a stoke in 01/2013. Numbness is occasional, especially when trying to chew.  . Productive cough 08/2013   With brown sputum   . SOB (shortness of breath) on exertion 08/2013  . Stroke ([place]) 01/2013   weakness of right side from CVA  . Type 2 diabetes mellitus ([place])   . Uses  hearing aid 2014   recently received new hearing aids    Past Surgical History:  Procedure Laterality Date  . BIOPSY N/A 04/10/2014   Procedure: GASTRIC BIOPSY;  Surgeon: Daneil Dolin, MD;  Location: [facility];  Service: Endoscopy;  Laterality: N/A;  . BIOPSY  10/12/2015   Procedure: BIOPSY;  Surgeon: Daneil Dolin, MD;  Location: [facility];  Service: Endoscopy;;  stomach bx's  . CARDIAC DEFIBRILLATOR PLACEMENT  11/12/12   [employer] Inogen MINI ICD implanted in [place] at [place] [facility] per Dr Nelly Laurence note  . COLONOSCOPY WITH PROPOFOL N/A 04/10/2014   RMR: Colonic Diverticulosis  . ESOPHAGOGASTRODUODENOSCOPY (EGD) WITH PROPOFOL N/A 04/10/2014   RMR: Mild erosive reflux esophagitis. Multiple antral polyps likely hyperplastic status post removal by hot snare cautery technique. Diffusely abnormal stomach status post gastric biopsy I suspect some of patients anemaia may be due to intermittent oozing from the stomach. It would be difficult and a risky proposition to attempt complete removal of all of his gastric polyps.  . ESOPHAGOGASTRODUODENOSCOPY (EGD) WITH PROPOFOL N/A 10/12/2015   Procedure: ESOPHAGOGASTRODUODENOSCOPY (EGD) WITH PROPOFOL;  Surgeon: Daneil Dolin, MD;  Location: [facility];  Service: Endoscopy;  Laterality: N/A;  [id number] - moved to 9:15  . POLYPECTOMY N/A 04/10/2014   Procedure: GASTRIC POLYPECTOMY;  Surgeon: Daneil Dolin, MD;  Location: [facility];  Service: Endoscopy;  Laterality: N/A;  . RIGHT HEART CATHETERIZATION  N/A 02/06/2014   Procedure: RIGHT HEART CATH;  Surgeon: Larey Dresser, MD;  Location: [facility] CATH LAB;  Service: Cardiovascular;  Laterality: N/A;    Medications:  Home meds:  Current Meds  Medication Sig  . acetaminophen (TYLENOL) 325 MG tablet Take 2 tablets (650 mg total) by mouth every 4 (four) hours as needed for mild pain (or temp > 37.5 C (99.5 F)).  Marland Kitchen aspirin 325 MG tablet Take 1 tablet (325  mg total) by mouth daily.  Marland Kitchen atorvastatin (LIPITOR) 80 MG tablet Take 80 mg by mouth daily.   . carvedilol (COREG) 25 MG tablet Take 12.5 mg by mouth 2 (two) times daily with a meal.   . Cholecalciferol (VITAMIN D3) 2000 UNITS TABS Take 2,000 mg by mouth daily.  . digoxin (LANOXIN) 0.125 MG tablet TAKE 1/2 TABLET BY MOUTH DAILY  . feeding supplement, ENSURE ENLIVE, (ENSURE ENLIVE) LIQD Take 237 mLs by mouth 2 (two) times daily between meals.  . ferrous sulfate 325 (65 FE) MG tablet Take 325 mg by mouth daily with breakfast.  . furosemide (LASIX) 40 MG tablet Take 40 mg by mouth daily.  . isosorbide dinitrate (ISORDIL) 20 MG tablet Take 40 mg by mouth 3 (three) times daily.   . mirabegron ER (MYRBETRIQ) 25 MG TB24 tablet Take 25 mg by mouth daily.  . mirtazapine (REMERON) 7.5 MG tablet Take 1 tablet by mouth daily.  . Multiple Vitamin (MULTIVITAMIN WITH MINERALS) TABS tablet Take 1 tablet by mouth daily.  . pantoprazole (PROTONIX) 40 MG tablet TAKE ONE TABLET   BY MOUTH   DAILY    Scheduled Meds: . atorvastatin  80 mg Oral Daily  . carvedilol  3.125 mg Oral BID WC  . insulin aspart  0-9 Units Subcutaneous TID WC  . isosorbide dinitrate  10 mg Oral TID  . mirabegron ER  25 mg Oral Daily  . mirtazapine  7.5 mg Oral Daily  . pantoprazole  40 mg Oral Daily  . piperacillin-tazobactam (ZOSYN)  IV  2.25 g Intravenous Q8H  . [START ON 09/07/2016] vancomycin  500 mg Intravenous Q48H   Continuous Infusions: . dextrose 5 % and 0.9% NaCl 75 mL/hr at 09/05/16 1133   PRN Meds:.acetaminophen, ondansetron **OR** ondansetron (ZOFRAN) IV  Allergies: No Known Allergies  Family History  Problem Relation Age of Onset  . Kidney disease Mother   . Kidney disease Sister   . Colon cancer Neg Hx     Social History:  reports that he quit smoking about 22 years ago. His smoking use included Cigarettes. He has never used smokeless tobacco. He reports that he drinks alcohol. He reports that he does not use  drugs.  ROS: I was unable to obtain a full review of systems as the patient was not able to answer questions.  Physical Exam:  Vital signs in last 24 hours: Temp:  [97.8 F (36.6 C)-104.4 F (40.2 C)] 98.1 F (36.7 C) (04/09 1418) Pulse Rate:  [77-133] 120 (04/09 1418) Resp:  [17-30] 30 (04/09 1029) BP: (104-166)/(66-129) 104/68 (04/09 1418) SpO2:  [97 %-100 %] 100 % (04/09 0520) Weight:  [47.1 kg (103 lb 12.8 oz)] 47.1 kg (103 lb 12.8 oz) (04/08 2311) Constitutional:  Alert and awake, No acute distress Cardiovascular: Regular rate and rhythm, No JVD Respiratory: Normal respiratory effort GI: Abdomen is soft, nontender, nondistended, no abdominal masses Genitourinary: No CVAT. Normal uncircumcised male phallus, testes are descended bilaterally and non-tender and without masses, scrotum is normal in appearance without lesions  or masses, perineum is normal on inspection. Rectal: Normal sphincter tone, no rectal masses, prostate is non tender and without nodularity. Lymphatic: No lymphadenopathy Neurologic: Grossly intact, no focal deficits Psychiatric: Normal mood and affect  Laboratory Data:   Recent Labs  09/04/16 1910 09/05/16 0542  WBC 3.0* 3.1*  HGB 11.9* 10.9*  HCT 36.8* 33.7*  PLT 179 190     Recent Labs  09/04/16 1910 09/05/16 0542  NA 131* 135  K 6.4* 4.8  CL 104 107  GLUCOSE 123* 88  BUN 117* 124*  CALCIUM 9.1 8.6*  CREATININE 4.41* 3.71*     Results for orders placed or performed during the hospital encounter of 09/04/16 (from the past 24 hour(s))  Urinalysis, Routine w reflex microscopic     Status: Abnormal   Collection Time: 09/04/16  8:53 PM  Result Value Ref Range   Color, Urine YELLOW YELLOW   APPearance CLOUDY (A) CLEAR   Specific Gravity, Urine 1.011 1.005 - 1.030   pH 5.0 5.0 - 8.0   Glucose, UA NEGATIVE NEGATIVE mg/dL   Hgb urine dipstick LARGE (A) NEGATIVE   Bilirubin Urine NEGATIVE NEGATIVE   Ketones, ur NEGATIVE NEGATIVE mg/dL    Protein, ur 30 (A) NEGATIVE mg/dL   Nitrite NEGATIVE NEGATIVE   Leukocytes, UA LARGE (A) NEGATIVE   RBC / HPF TOO NUMEROUS TO COUNT 0 - 5 RBC/hpf   WBC, UA 6-30 0 - 5 WBC/hpf   Bacteria, UA MANY (A) NONE SEEN   Squamous Epithelial / LPF 6-30 (A) NONE SEEN   WBC Clumps PRESENT    Mucous PRESENT   CBC     Status: Abnormal   Collection Time: 09/05/16  5:42 AM  Result Value Ref Range   WBC 3.1 (L) 4.0 - 10.5 K/uL   RBC 3.98 (L) 4.22 - 5.81 MIL/uL   Hemoglobin 10.9 (L) 13.0 - 17.0 g/dL   HCT 33.7 (L) 39.0 - 52.0 %   MCV 84.7 78.0 - 100.0 fL   MCH 27.4 26.0 - 34.0 pg   MCHC 32.3 30.0 - 36.0 g/dL   RDW 14.6 11.5 - 15.5 %   Platelets 190 150 - 400 K/uL  Comprehensive metabolic panel     Status: Abnormal   Collection Time: 09/05/16  5:42 AM  Result Value Ref Range   Sodium 135 135 - 145 mmol/L   Potassium 4.8 3.5 - 5.1 mmol/L   Chloride 107 101 - 111 mmol/L   CO2 21 (L) 22 - 32 mmol/L   Glucose, Bld 88 65 - 99 mg/dL   BUN 124 (H) 6 - 20 mg/dL   Creatinine, Ser 3.71 (H) 0.61 - 1.24 mg/dL   Calcium 8.6 (L) 8.9 - 10.3 mg/dL   Total Protein 7.5 6.5 - 8.1 g/dL   Albumin 2.5 (L) 3.5 - 5.0 g/dL   AST 41 15 - 41 U/L   ALT 52 17 - 63 U/L   Alkaline Phosphatase 65 38 - 126 U/L   Total Bilirubin 0.2 (L) 0.3 - 1.2 mg/dL   GFR calc non Af Amer 16 (L) >60 mL/min   GFR calc Af Amer 18 (L) >60 mL/min   Anion gap 7 5 - 15  Digoxin level     Status: Abnormal   Collection Time: 09/05/16  5:42 AM  Result Value Ref Range   Digoxin Level 0.6 (L) 0.8 - 2.0 ng/mL  Glucose, capillary     Status: None   Collection Time: 09/05/16  8:56 AM  Result Value  Ref Range   Glucose-Capillary 76 65 - 99 mg/dL   Comment 1 Notify RN    Comment 2 Document in Chart   Culture, blood (Routine X 2) w Reflex to ID Panel     Status: None (Preliminary result)   Collection Time: 09/05/16 10:43 AM  Result Value Ref Range   Specimen Description BLOOD RIGHT ARM    Special Requests      BOTTLES DRAWN AEROBIC AND ANAEROBIC  Blood Culture adequate volume   Culture PENDING    Report Status PENDING   Culture, blood (Routine X 2) w Reflex to ID Panel     Status: None (Preliminary result)   Collection Time: 09/05/16 10:47 AM  Result Value Ref Range   Specimen Description BLOOD RIGHT HAND    Special Requests      BOTTLES DRAWN AEROBIC AND ANAEROBIC Blood Culture adequate volume   Culture PENDING    Report Status PENDING   Glucose, capillary     Status: None   Collection Time: 09/05/16 11:31 AM  Result Value Ref Range   Glucose-Capillary 87 65 - 99 mg/dL   Comment 1 QC Due    Comment 2 Notify RN    Comment 3 Document in Chart   Glucose, capillary     Status: Abnormal   Collection Time: 09/05/16  4:43 PM  Result Value Ref Range   Glucose-Capillary 151 (H) 65 - 99 mg/dL   Comment 1 Notify RN    Recent Results (from the past 240 hour(s))  Culture, blood (Routine X 2) w Reflex to ID Panel     Status: None (Preliminary result)   Collection Time: 09/05/16 10:43 AM  Result Value Ref Range Status   Specimen Description BLOOD RIGHT ARM  Final   Special Requests   Final    BOTTLES DRAWN AEROBIC AND ANAEROBIC Blood Culture adequate volume   Culture PENDING  Incomplete   Report Status PENDING  Incomplete  Culture, blood (Routine X 2) w Reflex to ID Panel     Status: None (Preliminary result)   Collection Time: 09/05/16 10:47 AM  Result Value Ref Range Status   Specimen Description BLOOD RIGHT HAND  Final   Special Requests   Final    BOTTLES DRAWN AEROBIC AND ANAEROBIC Blood Culture adequate volume   Culture PENDING  Incomplete   Report Status PENDING  Incomplete    Renal Function:  Recent Labs  09/04/16 1910 09/05/16 0542  CREATININE 4.41* 3.71*   Estimated Creatinine Clearance: 13.4 mL/min (A) (by C-G formula based on SCr of 3.71 mg/dL (H)).  Radiologic Imaging: US Renal  Result Date: 09/05/2016 CLINICAL DATA:  Hematuria.  Acute kidney injury. EXAM: RENAL / URINARY TRACT ULTRASOUND COMPLETE  COMPARISON:  Abdominal ultrasound 05/14/2014 FINDINGS: Right Kidney: Length: 9.6 cm. Echogenicity within normal limits. No hydronephrosis. 3.0 x 2.6 x 3.0 cm interpolar cyst. Left Kidney: Length: 9.4 cm. Echogenicity within normal limits. No hydronephrosis. 2.1 x 1.7 x 1.3 cm interpolar cyst. Both renal cysts are mildly larger than on the 2015 examination, although some of the apparent change likely reflects differences in measurement technique. Bladder: Nondistended. IMPRESSION: 1. No hydronephrosis. 2. Bilateral renal cysts. Electronically Signed   By: Logan Bores M.D.   On: 09/05/2016 09:27   Dg Chest Port 1 View  Result Date: 09/05/2016 CLINICAL DATA:  Fever, dehydration, acute kidney injury EXAM: PORTABLE CHEST 1 VIEW COMPARISON:  07/21/2016 FINDINGS: Cardiomediastinal silhouette is stable. Single lead cardiac pacemaker is unchanged in position. Status post median  sternotomy. No infiltrate or pleural effusion. No pulmonary edema. IMPRESSION: No active disease. Electronically Signed   By: Lahoma Crocker M.D.   On: 09/05/2016 11:28    I independently reviewed the above imaging studies.  Procedure: The patient's genitalia was prepped with Betadine.  I inserted a 16 [place] coud catheter was able to be passed into the bladder without significant resistance.  This returned grossly purulent urine.  Impression/Recommendation 1) Difficult urethral catheterization: His catheter can be removed when no longer medically necessary.  2) Febrile UTI: Urine culture has been obtained.  I would recommend continued broad-spectrum IV antibiotics pending his culture results. He does not appear to have any evidence of urinary tract obstruction based on his renal/bladder ultrasound today.  Casia Corti,LES 09/05/2016, 7:14 PM    Pryor Curia MD  CC: Dr. Velvet Bathe

## 2016-09-05 NOTE — Progress Notes (Signed)
Nurse Tech Tiffany rechecked CBG and it is 75.  Will continue to monitor CBG's.  Per orders, no treatment is needed unless 70 or below.

## 2016-09-05 NOTE — Clinical Social Work Note (Signed)
Clinical Social Work Assessment  Patient Details  Name: Keith Young MRN: [id number] Date of Birth: 1951-05-13  Date of referral:  09/05/16               Reason for consult:  Discharge Planning (from [facility])                Permission sought to share information with:  Case Manager, Customer service manager, Family Supports Permission granted to share information::  Yes, Verbal Permission Granted  Name::        Agency::  [facility]  Relationship::  brother: Keith Young Information:     Housing/Transportation Living arrangements for the past 2 months:  [place] of Information:  Medical Team, Tourist information centre manager, Facility, Adult Children Patient Interpreter Needed:  None Criminal Activity/Legal Involvement Pertinent to Current Situation/Hospitalization:  No - Comment as needed Significant Relationships:  Adult Children, Other Family Members, Community Support Lives with:  Facility Resident Do you feel safe going back to the place where you live?  Yes Need for family participation in patient care:  Yes (Comment) (nonverbal, AMS)  Care giving concerns:  Patient admitted from nursing home: [place] [facility].  Patient was placed at nursing home in February 2018 in an attempt for SNF, however insurance authorization was denied and currently he is placed under his medicaid.  Patient will return to nursing home under medicaid.  No concerns from facility with patient returning.  Prior to admission in 2/18 patient was at family care home and required too much assistance.  At this time patient has a palliative care consult due to poor eating, loss of weight, nonverbal status and decompensation.  Will follow up with palliative recommendations. No family present at this time.    Social Worker assessment / plan:  LCSW consulted due to patient from [facility]. Spoke with facility, able to return and at this time will plan for return to nursing home  with no barriers. No concerns from family, awaiting palliative care consult for additional support at facility.   Plan: Still determining needs, however most likely will DC back to [facility].   Employment status:  Disabled (Comment on whether or not currently receiving Disability) Insurance information:  Programmer, applications, Medicaid [place] PT Recommendations:  Not assessed at this time (PT is LTC, no PT consulted needed.) Information / Referral to community resources:     Patient/Family's Response to care:  Still assessing  Patient/Family's Understanding of and Emotional Response to Diagnosis, Current Treatment, and Prognosis:  Patient nonverbal, still assessing.  Emotional Assessment Appearance:  Appears stated age Attitude/Demeanor/Rapport:  Unable to Assess (patient nonverbal) Affect (typically observed):  Unable to Assess Orientation:  Oriented to Self Alcohol / Substance use:  Not Applicable Psych involvement (Current and /or in the community):  No (Comment)  Discharge Needs  Concerns to be addressed:  No discharge needs identified Readmission within the last 30 days:  Yes Current discharge risk:  None Barriers to Discharge:  No Barriers Identified, Continued Medical Work up   [place]illy [place], LCSW 09/05/2016, 1:30 PM

## 2016-09-05 NOTE — Progress Notes (Signed)
Subjective: This is a patient who was admitted yesterday with dehydration and acute kidney injury. He has been at a skilled care facility but has not done well. He's had problems with his blood pressure being low and I have been continuously reducing his blood pressure medications. He had several falls. He's not eating well. He sleeps most of the time. This morning he is more alert than when I saw him 2 days ago at the nursing home. He has what is presumed to be a urinary tract infection. Denies chest pain nausea vomiting diarrhea.  Objective: Vital signs in last 24 hours: Temp:  [97.7 F (36.5 C)-98.2 F (36.8 C)] 98.2 F (36.8 C) (04/09 0520) Pulse Rate:  [72-84] 84 (04/09 0520) Resp:  [15-21] 20 (04/09 0520) BP: (96-160)/(66-83) 118/66 (04/09 0520) SpO2:  [96 %-100 %] 100 % (04/09 0520) Weight:  [47.1 kg (103 lb 12.8 oz)-49.9 kg (110 lb)] 47.1 kg (103 lb 12.8 oz) (04/08 2311) Weight change:  Last BM Date: 09/04/16  Intake/Output from previous day: 04/08 0701 - 04/09 0700 In: 1376.3 [P.O.:120; I.V.:256.3; IV Piggyback:1000] Out: 50 [Urine:50]  PHYSICAL EXAM General appearance: alert, cooperative and no distress Resp: clear to auscultation bilaterally Cardio: regular rate and rhythm, S1, S2 normal, no murmur, click, rub or gallop GI: soft, non-tender; bowel sounds normal; no masses,  no organomegaly Extremities: extremities normal, atraumatic, no cyanosis or edema Skin warm and dry. Mucous membranes are moist  Lab Results:  Results for orders placed or performed during the hospital encounter of 09/04/16 (from the past 48 hour(s))  CBC with Differential     Status: Abnormal   Collection Time: 09/04/16  7:10 PM  Result Value Ref Range   WBC 3.0 (L) 4.0 - 10.5 K/uL   RBC 4.32 4.22 - 5.81 MIL/uL   Hemoglobin 11.9 (L) 13.0 - 17.0 g/dL   HCT 36.8 (L) 39.0 - 52.0 %   MCV 85.2 78.0 - 100.0 fL   MCH 27.5 26.0 - 34.0 pg   MCHC 32.3 30.0 - 36.0 g/dL   RDW 14.6 11.5 - 15.5 %   Platelets 179 150 - 400 K/uL   Neutrophils Relative % 58 %   Neutro Abs 1.7 1.7 - 7.7 K/uL   Lymphocytes Relative 24 %   Lymphs Abs 0.7 0.7 - 4.0 K/uL   Monocytes Relative 15 %   Monocytes Absolute 0.4 0.1 - 1.0 K/uL   Eosinophils Relative 3 %   Eosinophils Absolute 0.1 0.0 - 0.7 K/uL   Basophils Relative 0 %   Basophils Absolute 0.0 0.0 - 0.1 K/uL  Basic metabolic panel     Status: Abnormal   Collection Time: 09/04/16  7:10 PM  Result Value Ref Range   Sodium 131 (L) 135 - 145 mmol/L   Potassium 6.4 (HH) 3.5 - 5.1 mmol/L    Comment: CRITICAL RESULT CALLED TO, READ BACK BY AND VERIFIED WITH:  POIENDEXTER,M @ 2004 ON 09/04/16 BY JUW    Chloride 104 101 - 111 mmol/L   CO2 19 (L) 22 - 32 mmol/L   Glucose, Bld 123 (H) 65 - 99 mg/dL   BUN 117 (H) 6 - 20 mg/dL    Comment: RESULTS CONFIRMED BY MANUAL DILUTION   Creatinine, Ser 4.41 (H) 0.61 - 1.24 mg/dL   Calcium 9.1 8.9 - 10.3 mg/dL   GFR calc non Af Amer 13 (L) >60 mL/min   GFR calc Af Amer 15 (L) >60 mL/min    Comment: (NOTE) The eGFR has been calculated  using the CKD EPI equation. This calculation has not been validated in all clinical situations. eGFR's persistently <60 mL/min signify possible Chronic Kidney Disease.    Anion gap 8 5 - 15  Urinalysis, Routine w reflex microscopic     Status: Abnormal   Collection Time: 09/04/16  8:53 PM  Result Value Ref Range   Color, Urine YELLOW YELLOW   APPearance CLOUDY (A) CLEAR   Specific Gravity, Urine 1.011 1.005 - 1.030   pH 5.0 5.0 - 8.0   Glucose, UA NEGATIVE NEGATIVE mg/dL   Hgb urine dipstick LARGE (A) NEGATIVE   Bilirubin Urine NEGATIVE NEGATIVE   Ketones, ur NEGATIVE NEGATIVE mg/dL   Protein, ur 30 (A) NEGATIVE mg/dL   Nitrite NEGATIVE NEGATIVE   Leukocytes, UA LARGE (A) NEGATIVE   RBC / HPF TOO NUMEROUS TO COUNT 0 - 5 RBC/hpf   WBC, UA 6-30 0 - 5 WBC/hpf   Bacteria, UA MANY (A) NONE SEEN   Squamous Epithelial / LPF 6-30 (A) NONE SEEN   WBC Clumps PRESENT    Mucous  PRESENT   CBC     Status: Abnormal   Collection Time: 09/05/16  5:42 AM  Result Value Ref Range   WBC 3.1 (L) 4.0 - 10.5 K/uL   RBC 3.98 (L) 4.22 - 5.81 MIL/uL   Hemoglobin 10.9 (L) 13.0 - 17.0 g/dL   HCT 33.7 (L) 39.0 - 52.0 %   MCV 84.7 78.0 - 100.0 fL   MCH 27.4 26.0 - 34.0 pg   MCHC 32.3 30.0 - 36.0 g/dL   RDW 14.6 11.5 - 15.5 %   Platelets 190 150 - 400 K/uL  Comprehensive metabolic panel     Status: Abnormal   Collection Time: 09/05/16  5:42 AM  Result Value Ref Range   Sodium 135 135 - 145 mmol/L   Potassium 4.8 3.5 - 5.1 mmol/L    Comment: DELTA CHECK NOTED   Chloride 107 101 - 111 mmol/L   CO2 21 (L) 22 - 32 mmol/L   Glucose, Bld 88 65 - 99 mg/dL   BUN 124 (H) 6 - 20 mg/dL    Comment: RESULTS CONFIRMED BY MANUAL DILUTION   Creatinine, Ser 3.71 (H) 0.61 - 1.24 mg/dL   Calcium 8.6 (L) 8.9 - 10.3 mg/dL   Total Protein 7.5 6.5 - 8.1 g/dL   Albumin 2.5 (L) 3.5 - 5.0 g/dL   AST 41 15 - 41 U/L   ALT 52 17 - 63 U/L   Alkaline Phosphatase 65 38 - 126 U/L   Total Bilirubin 0.2 (L) 0.3 - 1.2 mg/dL   GFR calc non Af Amer 16 (L) >60 mL/min   GFR calc Af Amer 18 (L) >60 mL/min    Comment: (NOTE) The eGFR has been calculated using the CKD EPI equation. This calculation has not been validated in all clinical situations. eGFR's persistently <60 mL/min signify possible Chronic Kidney Disease.    Anion gap 7 5 - 15  Digoxin level     Status: Abnormal   Collection Time: 09/05/16  5:42 AM  Result Value Ref Range   Digoxin Level 0.6 (L) 0.8 - 2.0 ng/mL    ABGS No results for input(s): PHART, PO2ART, TCO2, HCO3 in the last 72 hours.  Invalid input(s): PCO2 CULTURES No results found for this or any previous visit (from the past 240 hour(s)). Studies/Results: No results found.  Medications:  Prior to Admission:  Prescriptions Prior to Admission  Medication Sig Dispense Refill Last Dose  .  acetaminophen (TYLENOL) 325 MG tablet Take 2 tablets (650 mg total) by mouth every 4  (four) hours as needed for mild pain (or temp > 37.5 C (99.5 F)).   07/20/2016 at Unknown time  . aspirin 325 MG tablet Take 1 tablet (325 mg total) by mouth daily.   07/20/2016 at Unknown time  . atorvastatin (LIPITOR) 80 MG tablet Take 80 mg by mouth daily.    07/20/2016 at Unknown time  . carvedilol (COREG) 25 MG tablet Take 12.5 mg by mouth 2 (two) times daily with a meal.    07/20/2016 at 1800  . Cholecalciferol (VITAMIN D3) 2000 UNITS TABS Take 2,000 mg by mouth daily.   07/20/2016 at Unknown time  . digoxin (LANOXIN) 0.125 MG tablet TAKE 1/2 TABLET BY MOUTH DAILY 15 tablet 3 07/20/2016 at Unknown time  . feeding supplement, ENSURE ENLIVE, (ENSURE ENLIVE) LIQD Take 237 mLs by mouth 2 (two) times daily between meals. 237 mL 12 07/20/2016 at Unknown time  . ferrous sulfate 325 (65 FE) MG tablet Take 325 mg by mouth daily with breakfast.   07/20/2016 at Unknown time  . furosemide (LASIX) 40 MG tablet Take 40 mg by mouth daily.   07/20/2016 at Unknown time  . isosorbide dinitrate (ISORDIL) 20 MG tablet Take 40 mg by mouth 3 (three) times daily.    07/20/2016 at Unknown time  . losartan (COZAAR) 25 MG tablet Take 25 mg by mouth daily.   07/20/2016 at Unknown time  . mirabegron ER (MYRBETRIQ) 25 MG TB24 tablet Take 25 mg by mouth daily.   07/20/2016 at Unknown time  . mirtazapine (REMERON) 7.5 MG tablet Take 1 tablet by mouth daily.   07/20/2016 at Unknown time  . Multiple Vitamin (MULTIVITAMIN WITH MINERALS) TABS tablet Take 1 tablet by mouth daily.   07/20/2016 at Unknown time  . pantoprazole (PROTONIX) 40 MG tablet TAKE ONE TABLET   BY MOUTH   DAILY 30 tablet 11 07/20/2016 at Unknown time   Scheduled: . atorvastatin  80 mg Oral Daily  . carvedilol  12.5 mg Oral BID WC  . insulin aspart  0-9 Units Subcutaneous TID WC  . isosorbide dinitrate  40 mg Oral TID  . mirabegron ER  25 mg Oral Daily  . mirtazapine  7.5 mg Oral Daily  . pantoprazole  40 mg Oral Daily   Continuous: . sodium chloride 75 mL/hr at  09/04/16 2335   FBP:ZWCHENIDPOE **OR** ondansetron (ZOFRAN) IV  Assesment:He has acute kidney injury. He was hyperkalemic. In addition he has severe malnutrition previous stroke congestive heart failure history of cirrhosis of the liver COPD. He is not doing well at the skilled care facility. I'm concerned that he may be at end-of-life. Active Problems:   Diabetes mellitus type 2 in nonobese [facility])   Essential hypertension, benign   Hyperkalemia   Severe malnutrition (HCC)   AKI (acute kidney injury) ([place])    Plan: Continue IV fluids. Request palliative care consultation. Adjust his medications as the medications that he is on I have decreased substantially in the last few days at the nursing home because of his hypotension    LOS: 0 days   Keith Young L 09/05/2016, 8:20 AM

## 2016-09-05 NOTE — Progress Notes (Signed)
Microbiology lab called with critical lab results, which are both (2) anaerobic blood cultures resulted positive for gram (-) rods.  This result was called to Dr. Willey Blade tonight at 11pm.  No changes need to be made to antibiotic regimen at this point.

## 2016-09-05 NOTE — Progress Notes (Signed)
Patient nonverbal this morning, upon assessment patient was very warm to the touch,rectal temp 104.4, B/P, 166/129, HR 133, respirations 30, Dr Luan Pulling notified.Well continue to monitor patient.

## 2016-09-05 NOTE — Progress Notes (Signed)
Nurse Tech Tiffany reported to me that CBG was 79.  Patient was assessed and is asymptomatic.  Tiffany will recheck CBG every hour to keep patient safe and make sure CBG's do not drop.

## 2016-09-05 NOTE — Progress Notes (Signed)
Pharmacy Antibiotic Note  Keith Young is a 66 y.o. male admitted on 09/04/2016 with UTI and possible other site of infection.  Pharmacy has been consulted for Vancomycin and Zosyn dosing.  Plan: Vancomycin 1000mg  loading dose, then 500mg  IV q48h. Goal trough 15-5mcg/ml Zosyn 2.25gm IV q8h F/U cxs and clinical progress Monitor V/S, labs, and levels as indicated  Height: 5\' 7"  (170.2 cm) Weight: 103 lb 12.8 oz (47.1 kg) IBW/kg (Calculated) : 66.1  Temp (24hrs), Avg:99.5 F (37.5 C), Min:97.7 F (36.5 C), Max:104.4 F (40.2 C)   Recent Labs Lab 09/04/16 1910 09/05/16 0542  WBC 3.0* 3.1*  CREATININE 4.41* 3.71*    Estimated Creatinine Clearance: 13.4 mL/min (A) (by C-G formula based on SCr of 3.71 mg/dL (H)).    No Known Allergies  Antimicrobials this admission: Vancomycin 4/9 >>  Zosyn 4/9 >>   Dose adjustments this admission: N/A  Microbiology results: 4/9 BCx: pending 4/8 UCx: pending  Thank you for allowing pharmacy to be a part of this patient's care.  Isac Sarna, BS Pharm D, [place] Clinical Pharmacist Pager [phone] 09/05/2016 11:15 AM

## 2016-09-05 NOTE — Progress Notes (Signed)
Patient had a 3 beat run of PVCs

## 2016-09-06 ENCOUNTER — Encounter (HOSPITAL_COMMUNITY): Payer: Self-pay | Admitting: Infectious Disease

## 2016-09-06 DIAGNOSIS — B20 Human immunodeficiency virus [HIV] disease: Secondary | ICD-10-CM | POA: Diagnosis present

## 2016-09-06 DIAGNOSIS — E43 Unspecified severe protein-calorie malnutrition: Secondary | ICD-10-CM | POA: Diagnosis present

## 2016-09-06 HISTORY — DX: Human immunodeficiency virus (HIV) disease: B20

## 2016-09-06 LAB — MRSA PCR SCREENING: MRSA by PCR: NEGATIVE

## 2016-09-06 LAB — BLOOD CULTURE ID PANEL (REFLEXED)
ACINETOBACTER BAUMANNII: NOT DETECTED
CANDIDA ALBICANS: NOT DETECTED
CANDIDA KRUSEI: NOT DETECTED
Candida glabrata: NOT DETECTED
Candida parapsilosis: NOT DETECTED
Candida tropicalis: NOT DETECTED
Carbapenem resistance: NOT DETECTED
ENTEROBACTER CLOACAE COMPLEX: NOT DETECTED
ENTEROCOCCUS SPECIES: NOT DETECTED
ESCHERICHIA COLI: DETECTED — AB
Enterobacteriaceae species: DETECTED — AB
Haemophilus influenzae: NOT DETECTED
KLEBSIELLA OXYTOCA: NOT DETECTED
Klebsiella pneumoniae: NOT DETECTED
LISTERIA MONOCYTOGENES: NOT DETECTED
NEISSERIA MENINGITIDIS: NOT DETECTED
Proteus species: NOT DETECTED
Pseudomonas aeruginosa: NOT DETECTED
STREPTOCOCCUS AGALACTIAE: NOT DETECTED
STREPTOCOCCUS PNEUMONIAE: NOT DETECTED
STREPTOCOCCUS PYOGENES: NOT DETECTED
Serratia marcescens: NOT DETECTED
Staphylococcus aureus (BCID): NOT DETECTED
Staphylococcus species: NOT DETECTED
Streptococcus species: NOT DETECTED

## 2016-09-06 LAB — URINE CULTURE

## 2016-09-06 LAB — GLUCOSE, CAPILLARY
GLUCOSE-CAPILLARY: 79 mg/dL (ref 65–99)
GLUCOSE-CAPILLARY: 130 mg/dL — AB (ref 65–99)
GLUCOSE-CAPILLARY: 124 mg/dL — AB (ref 65–99)
GLUCOSE-CAPILLARY: 116 mg/dL — AB (ref 65–99)
Glucose-Capillary: 155 mg/dL — ABNORMAL HIGH (ref 65–99)
Glucose-Capillary: 106 mg/dL — ABNORMAL HIGH (ref 65–99)

## 2016-09-06 LAB — BASIC METABOLIC PANEL
Anion gap: 12 (ref 5–15)
BUN: 130 mg/dL — AB (ref 6–20)
CHLORIDE: 109 mmol/L (ref 101–111)
CO2: 16 mmol/L — ABNORMAL LOW (ref 22–32)
Calcium: 8.2 mg/dL — ABNORMAL LOW (ref 8.9–10.3)
Creatinine, Ser: 3.85 mg/dL — ABNORMAL HIGH (ref 0.61–1.24)
GFR calc Af Amer: 18 mL/min — ABNORMAL LOW (ref >60)
GFR calc non Af Amer: 15 mL/min — ABNORMAL LOW (ref >60)
Glucose, Bld: 179 mg/dL — ABNORMAL HIGH (ref 65–99)
POTASSIUM: 4.3 mmol/L (ref 3.5–5.1)
Sodium: 137 mmol/L (ref 135–145)

## 2016-09-06 LAB — HIV 1/2 AB DIFFERENTIATION
HIV 1 Ab: POSITIVE — AB
HIV 2 AB: NEGATIVE

## 2016-09-06 LAB — HEMOGLOBIN A1C
HEMOGLOBIN A1C: 6.1 % — AB (ref 4.8–5.6)
Mean Plasma Glucose: 128 mg/dL

## 2016-09-06 LAB — HIV ANTIBODY (ROUTINE TESTING W REFLEX): HIV SCREEN 4TH GENERATION: REACTIVE — AB

## 2016-09-06 MED ORDER — ACETAMINOPHEN 650 MG RE SUPP: 650.0000 mg | Freq: Four times a day (QID) | RECTAL | Status: DC | PRN

## 2016-09-06 MED ORDER — ACETAMINOPHEN 325 MG PO TABS
650.0000 mg | ORAL_TABLET | Freq: Four times a day (QID) | ORAL | Status: DC | PRN
  Administered 2016-09-06 – 2016-09-07 (×2): 650 mg via ORAL
  Filled 2016-09-06 (×2): qty 2

## 2016-09-06 MED ORDER — EMTRICITABINE-TENOFOVIR AF 200-25 MG PO TABS
1.0000 | ORAL_TABLET | Freq: Every day | ORAL | Status: DC
  Administered 2016-09-07 – 2016-09-12 (×6): 1 via ORAL
  Filled 2016-09-06 (×10): qty 1

## 2016-09-06 MED ORDER — CEFTRIAXONE SODIUM 2 G IJ SOLR
2.0000 g | INTRAMUSCULAR | Status: DC
  Administered 2016-09-06 – 2016-09-11 (×6): 2 g via INTRAVENOUS
  Filled 2016-09-06 (×7): qty 2

## 2016-09-06 MED ORDER — DOLUTEGRAVIR SODIUM 50 MG PO TABS
50.0000 mg | ORAL_TABLET | Freq: Every day | ORAL | Status: DC
  Administered 2016-09-07 – 2016-09-12 (×6): 50 mg via ORAL
  Filled 2016-09-06 (×9): qty 1

## 2016-09-06 NOTE — Progress Notes (Signed)
Patient asleep unable to get an reading with finger pulse ox: no signs of resp distress.

## 2016-09-06 NOTE — Progress Notes (Signed)
PHARMACY - PHYSICIAN COMMUNICATION CRITICAL VALUE ALERT - BLOOD CULTURE IDENTIFICATION (BCID)  Results for orders placed or performed during the hospital encounter of 09/04/16  Blood Culture ID Panel (Reflexed) (Collected: 09/05/2016 10:43 AM)  Result Value Ref Range   Enterococcus species NOT DETECTED NOT DETECTED   Listeria monocytogenes NOT DETECTED NOT DETECTED   Staphylococcus species NOT DETECTED NOT DETECTED   Staphylococcus aureus NOT DETECTED NOT DETECTED   Streptococcus species NOT DETECTED NOT DETECTED   Streptococcus agalactiae NOT DETECTED NOT DETECTED   Streptococcus pneumoniae NOT DETECTED NOT DETECTED   Streptococcus pyogenes NOT DETECTED NOT DETECTED   Acinetobacter baumannii NOT DETECTED NOT DETECTED   Enterobacteriaceae species DETECTED (A) NOT DETECTED   Enterobacter cloacae complex NOT DETECTED NOT DETECTED   Escherichia coli DETECTED (A) NOT DETECTED   Klebsiella oxytoca NOT DETECTED NOT DETECTED   Klebsiella pneumoniae NOT DETECTED NOT DETECTED   Proteus species NOT DETECTED NOT DETECTED   Serratia marcescens NOT DETECTED NOT DETECTED   Carbapenem resistance NOT DETECTED NOT DETECTED   Haemophilus influenzae NOT DETECTED NOT DETECTED   Neisseria meningitidis NOT DETECTED NOT DETECTED   Pseudomonas aeruginosa NOT DETECTED NOT DETECTED   Candida albicans NOT DETECTED NOT DETECTED   Candida glabrata NOT DETECTED NOT DETECTED   Candida krusei NOT DETECTED NOT DETECTED   Candida parapsilosis NOT DETECTED NOT DETECTED   Candida tropicalis NOT DETECTED NOT DETECTED    Name of physician (or Provider) Contacted: Dr Luan Pulling  Changes to prescribed antibiotics required: change to Rocephin  Hart Robinsons A 09/06/2016  1:12 PM

## 2016-09-06 NOTE — Progress Notes (Signed)
Subjective: He was admitted with what seemed to be a urinary tract infection. He spiked a temperature to 104 yesterday and blood cultures are positive for gram-negative rods. I think this would be consistent with presumed urinary tract infection. Additionally he has acute on chronic renal failure. He has multiple other medical problems including coronary disease which is stable, congestive heart failure stable, COPD stable chronic kidney disease which is worse, dehydration, and he's been having more problems with his blood pressure at his skilled care facility. I have reduced his doses of blood pressure medications to approximately one fourth of his original dose and cut his Lasix back he has severe protein calorie malnutrition. Palliative care consultation is underway  Objective: Vital signs in last 24 hours: Temp:  [98.1 F (36.7 C)-104.4 F (40.2 C)] 98.6 F (37 C) (04/10 0432) Pulse Rate:  [110-138] 110 (04/10 0432) Resp:  [18-30] 18 (04/10 0432) BP: (97-166)/(40-129) 97/40 (04/10 0432) SpO2:  [99 %-100 %] 100 % (04/10 0432) Weight change:  Last BM Date: 09/04/16  Intake/Output from previous day: 04/09 0701 - 04/10 0700 In: 1461.3 [P.O.:30; I.V.:1281.3; IV Piggyback:150] Out: 150 [Urine:150]  PHYSICAL EXAM General appearance: alert and Responsive but he goes back to sleep Resp: clear to auscultation bilaterally Cardio: regular rate and rhythm, S1, S2 normal, no murmur, click, rub or gallop GI: soft, non-tender; bowel sounds normal; no masses,  no organomegaly Extremities: extremities normal, atraumatic, no cyanosis or edema Skin warm and dry. He is very thin  Lab Results:  Results for orders placed or performed during the hospital encounter of 09/04/16 (from the past 48 hour(s))  CBC with Differential     Status: Abnormal   Collection Time: 09/04/16  7:10 PM  Result Value Ref Range   WBC 3.0 (L) 4.0 - 10.5 K/uL   RBC 4.32 4.22 - 5.81 MIL/uL   Hemoglobin 11.9 (L) 13.0 - 17.0  g/dL   HCT 36.8 (L) 39.0 - 52.0 %   MCV 85.2 78.0 - 100.0 fL   MCH 27.5 26.0 - 34.0 pg   MCHC 32.3 30.0 - 36.0 g/dL   RDW 14.6 11.5 - 15.5 %   Platelets 179 150 - 400 K/uL   Neutrophils Relative % 58 %   Neutro Abs 1.7 1.7 - 7.7 K/uL   Lymphocytes Relative 24 %   Lymphs Abs 0.7 0.7 - 4.0 K/uL   Monocytes Relative 15 %   Monocytes Absolute 0.4 0.1 - 1.0 K/uL   Eosinophils Relative 3 %   Eosinophils Absolute 0.1 0.0 - 0.7 K/uL   Basophils Relative 0 %   Basophils Absolute 0.0 0.0 - 0.1 K/uL  Basic metabolic panel     Status: Abnormal   Collection Time: 09/04/16  7:10 PM  Result Value Ref Range   Sodium 131 (L) 135 - 145 mmol/L   Potassium 6.4 (HH) 3.5 - 5.1 mmol/L    Comment: CRITICAL RESULT CALLED TO, READ BACK BY AND VERIFIED WITH:  POIENDEXTER,M @ 2004 ON 09/04/16 BY JUW    Chloride 104 101 - 111 mmol/L   CO2 19 (L) 22 - 32 mmol/L   Glucose, Bld 123 (H) 65 - 99 mg/dL   BUN 117 (H) 6 - 20 mg/dL    Comment: RESULTS CONFIRMED BY MANUAL DILUTION   Creatinine, Ser 4.41 (H) 0.61 - 1.24 mg/dL   Calcium 9.1 8.9 - 10.3 mg/dL   GFR calc non Af Amer 13 (L) >60 mL/min   GFR calc Af Amer 15 (L) >60 mL/min  Comment: (NOTE) The eGFR has been calculated using the CKD EPI equation. This calculation has not been validated in all clinical situations. eGFR's persistently <60 mL/min signify possible Chronic Kidney Disease.    Anion gap 8 5 - 15  Urinalysis, Routine w reflex microscopic     Status: Abnormal   Collection Time: 09/04/16  8:53 PM  Result Value Ref Range   Color, Urine YELLOW YELLOW   APPearance CLOUDY (A) CLEAR   Specific Gravity, Urine 1.011 1.005 - 1.030   pH 5.0 5.0 - 8.0   Glucose, UA NEGATIVE NEGATIVE mg/dL   Hgb urine dipstick LARGE (A) NEGATIVE   Bilirubin Urine NEGATIVE NEGATIVE   Ketones, ur NEGATIVE NEGATIVE mg/dL   Protein, ur 30 (A) NEGATIVE mg/dL   Nitrite NEGATIVE NEGATIVE   Leukocytes, UA LARGE (A) NEGATIVE   RBC / HPF TOO NUMEROUS TO COUNT 0 - 5 RBC/hpf    WBC, UA 6-30 0 - 5 WBC/hpf   Bacteria, UA MANY (A) NONE SEEN   Squamous Epithelial / LPF 6-30 (A) NONE SEEN   WBC Clumps PRESENT    Mucous PRESENT   CBC     Status: Abnormal   Collection Time: 09/05/16  5:42 AM  Result Value Ref Range   WBC 3.1 (L) 4.0 - 10.5 K/uL   RBC 3.98 (L) 4.22 - 5.81 MIL/uL   Hemoglobin 10.9 (L) 13.0 - 17.0 g/dL   HCT 33.7 (L) 39.0 - 52.0 %   MCV 84.7 78.0 - 100.0 fL   MCH 27.4 26.0 - 34.0 pg   MCHC 32.3 30.0 - 36.0 g/dL   RDW 14.6 11.5 - 15.5 %   Platelets 190 150 - 400 K/uL  Comprehensive metabolic panel     Status: Abnormal   Collection Time: 09/05/16  5:42 AM  Result Value Ref Range   Sodium 135 135 - 145 mmol/L   Potassium 4.8 3.5 - 5.1 mmol/L    Comment: DELTA CHECK NOTED   Chloride 107 101 - 111 mmol/L   CO2 21 (L) 22 - 32 mmol/L   Glucose, Bld 88 65 - 99 mg/dL   BUN 124 (H) 6 - 20 mg/dL    Comment: RESULTS CONFIRMED BY MANUAL DILUTION   Creatinine, Ser 3.71 (H) 0.61 - 1.24 mg/dL   Calcium 8.6 (L) 8.9 - 10.3 mg/dL   Total Protein 7.5 6.5 - 8.1 g/dL   Albumin 2.5 (L) 3.5 - 5.0 g/dL   AST 41 15 - 41 U/L   ALT 52 17 - 63 U/L   Alkaline Phosphatase 65 38 - 126 U/L   Total Bilirubin 0.2 (L) 0.3 - 1.2 mg/dL   GFR calc non Af Amer 16 (L) >60 mL/min   GFR calc Af Amer 18 (L) >60 mL/min    Comment: (NOTE) The eGFR has been calculated using the CKD EPI equation. This calculation has not been validated in all clinical situations. eGFR's persistently <60 mL/min signify possible Chronic Kidney Disease.    Anion gap 7 5 - 15  Digoxin level     Status: Abnormal   Collection Time: 09/05/16  5:42 AM  Result Value Ref Range   Digoxin Level 0.6 (L) 0.8 - 2.0 ng/mL  Hemoglobin A1c     Status: Abnormal   Collection Time: 09/05/16  5:42 AM  Result Value Ref Range   Hgb A1c MFr Bld 6.1 (H) 4.8 - 5.6 %    Comment: (NOTE)         Pre-diabetes: 5.7 - 6.4  Diabetes: >6.4         Glycemic control for adults with diabetes: <7.0    Mean Plasma  Glucose 128 mg/dL    Comment: (NOTE) Performed At: [facility] [place], [place] [id number] Lindon Romp MD IO:[id number]   Glucose, capillary     Status: None   Collection Time: 09/05/16  8:56 AM  Result Value Ref Range   Glucose-Capillary 76 65 - 99 mg/dL   Comment 1 Notify RN    Comment 2 Document in Chart   Culture, blood (Routine X 2) w Reflex to ID Panel     Status: None (Preliminary result)   Collection Time: 09/05/16 10:43 AM  Result Value Ref Range   Specimen Description BLOOD RIGHT ARM    Special Requests      BOTTLES DRAWN AEROBIC AND ANAEROBIC Blood Culture adequate volume   Culture  Setup Time      Performed at [facility] AT 2230 ON 4.9.2018 BY ISLEY,B IN BOTH AEROBIC AND ANAEROBIC BOTTLES Organism ID to follow CRITICAL RESULT CALLED TO, READ BACK BY AND VERIFIED WITH: LISA RUSSELL,RN _0  09/06/16 MKELLY,MLT Performed at [facility], [place] [address], [place], [place] [address]    Culture PENDING    Report Status PENDING   Blood Culture ID Panel (Reflexed)     Status: Abnormal   Collection Time: 09/05/16 10:43 AM  Result Value Ref Range   Enterococcus species NOT DETECTED NOT DETECTED   Listeria monocytogenes NOT DETECTED NOT DETECTED   Staphylococcus species NOT DETECTED NOT DETECTED   Staphylococcus aureus NOT DETECTED NOT DETECTED   Streptococcus species NOT DETECTED NOT DETECTED   Streptococcus agalactiae NOT DETECTED NOT DETECTED   Streptococcus pneumoniae NOT DETECTED NOT DETECTED   Streptococcus pyogenes NOT DETECTED NOT DETECTED   Acinetobacter baumannii NOT DETECTED NOT DETECTED   Enterobacteriaceae species DETECTED (A) NOT DETECTED    Comment: Enterobacteriaceae represent a large family of gram-negative bacteria, not a single organism. CRITICAL RESULT CALLED TO, READ BACK BY AND VERIFIED WITH: LISA RUSSELL,RN _1  09/06/16 MKELLY,MLT    Enterobacter cloacae complex NOT  DETECTED NOT DETECTED   Escherichia coli DETECTED (A) NOT DETECTED    Comment: CRITICAL RESULT CALLED TO, READ BACK BY AND VERIFIED WITH: LISA RUSSELL,RN _2  09/06/16 MKELLY,MLT    Klebsiella oxytoca NOT DETECTED NOT DETECTED   Klebsiella pneumoniae NOT DETECTED NOT DETECTED   Proteus species NOT DETECTED NOT DETECTED   Serratia marcescens NOT DETECTED NOT DETECTED   Carbapenem resistance NOT DETECTED NOT DETECTED   Haemophilus influenzae NOT DETECTED NOT DETECTED   Neisseria meningitidis NOT DETECTED NOT DETECTED   Pseudomonas aeruginosa NOT DETECTED NOT DETECTED   Candida albicans NOT DETECTED NOT DETECTED   Candida glabrata NOT DETECTED NOT DETECTED   Candida krusei NOT DETECTED NOT DETECTED   Candida parapsilosis NOT DETECTED NOT DETECTED   Candida tropicalis NOT DETECTED NOT DETECTED    Comment: Performed at [facility] [address] [place], [place] [address]  Culture, blood (Routine X 2) w Reflex to ID Panel     Status: None (Preliminary result)   Collection Time: 09/05/16 10:47 AM  Result Value Ref Range   Specimen Description BLOOD RIGHT HAND    Special Requests      BOTTLES DRAWN AEROBIC AND ANAEROBIC Blood Culture adequate volume   Culture  Setup Time      GRAM NEGATIVE RODS Performed at [facility] Gram Stain Report  Called to,Read Back By and Verified With: RUSSELL,L AT 2230 ON 4.9.2018 BY ISLEY,B IN BOTH AEROBIC AND ANAEROBIC BOTTLES CRITICAL VALUE NOTED.  VALUE IS CONSISTENT WITH PREVIOUSLY REPORTED AND CALLED VALUE. Performed at [facility], [place] [address] [place], [place] [address]    Culture PENDING    Report Status PENDING   Glucose, capillary     Status: None   Collection Time: 09/05/16 11:31 AM  Result Value Ref Range   Glucose-Capillary 87 65 - 99 mg/dL   Comment 1 QC Due    Comment 2 Notify RN    Comment 3 Document in Chart   Glucose, capillary     Status: Abnormal   Collection Time: 09/05/16  4:43 PM  Result Value Ref  Range   Glucose-Capillary 151 (H) 65 - 99 mg/dL   Comment 1 Notify RN   Sodium, urine, random     Status: None   Collection Time: 09/05/16  6:40 PM  Result Value Ref Range   Sodium, Ur 61 mmol/L  Creatinine, urine, random     Status: None   Collection Time: 09/05/16  6:40 PM  Result Value Ref Range   Creatinine, Urine 64.50 mg/dL  Osmolality, urine     Status: None   Collection Time: 09/05/16  6:40 PM  Result Value Ref Range   Osmolality, Ur 381 300 - 900 mOsm/kg    Comment: Performed at [facility], [place] [address], [place], [place] [address]  Glucose, capillary     Status: None   Collection Time: 09/05/16  9:48 PM  Result Value Ref Range   Glucose-Capillary 75 65 - 99 mg/dL   Comment 1 Notify RN    Comment 2 Document in Chart   Glucose, capillary     Status: None   Collection Time: 09/05/16 11:59 PM  Result Value Ref Range   Glucose-Capillary 69 65 - 99 mg/dL   Comment 1 Notify RN    Comment 2 Document in Chart   Glucose, capillary     Status: Abnormal   Collection Time: 09/06/16  2:53 AM  Result Value Ref Range   Glucose-Capillary 130 (H) 65 - 99 mg/dL  Basic metabolic panel     Status: Abnormal   Collection Time: 09/06/16  5:58 AM  Result Value Ref Range   Sodium 137 135 - 145 mmol/L   Potassium 4.3 3.5 - 5.1 mmol/L   Chloride 109 101 - 111 mmol/L   CO2 16 (L) 22 - 32 mmol/L   Glucose, Bld 179 (H) 65 - 99 mg/dL   BUN 130 (H) 6 - 20 mg/dL    Comment: RESULTS CONFIRMED BY MANUAL DILUTION   Creatinine, Ser 3.85 (H) 0.61 - 1.24 mg/dL   Calcium 8.2 (L) 8.9 - 10.3 mg/dL   GFR calc non Af Amer 15 (L) >60 mL/min   GFR calc Af Amer 18 (L) >60 mL/min    Comment: (NOTE) The eGFR has been calculated using the CKD EPI equation. This calculation has not been validated in all clinical situations. eGFR's persistently <60 mL/min signify possible Chronic Kidney Disease.    Anion gap 12 5 - 15  Glucose, capillary     Status: Abnormal   Collection Time: 09/06/16  7:34 AM   Result Value Ref Range   Glucose-Capillary 124 (H) 65 - 99 mg/dL   Comment 1 Notify RN    Comment 2 Document in Chart     ABGS No results for input(s): PHART, PO2ART, TCO2, HCO3  in the last 72 hours.  Invalid input(s): PCO2 CULTURES Recent Results (from the past 240 hour(s))  Culture, blood (Routine X 2) w Reflex to ID Panel     Status: None (Preliminary result)   Collection Time: 09/05/16 10:43 AM  Result Value Ref Range Status   Specimen Description BLOOD RIGHT ARM  Final   Special Requests   Final    BOTTLES DRAWN AEROBIC AND ANAEROBIC Blood Culture adequate volume   Culture  Setup Time   Final    Performed at [facility] AT 2230 ON 4.9.2018 BY ISLEY,B IN BOTH AEROBIC AND ANAEROBIC BOTTLES Organism ID to follow CRITICAL RESULT CALLED TO, READ BACK BY AND VERIFIED WITH: LISA RUSSELL,RN _0  09/06/16 MKELLY,MLT Performed at [facility], [place] [address] [place], [place] [address]    Culture PENDING  Incomplete   Report Status PENDING  Incomplete  Blood Culture ID Panel (Reflexed)     Status: Abnormal   Collection Time: 09/05/16 10:43 AM  Result Value Ref Range Status   Enterococcus species NOT DETECTED NOT DETECTED Final   Listeria monocytogenes NOT DETECTED NOT DETECTED Final   Staphylococcus species NOT DETECTED NOT DETECTED Final   Staphylococcus aureus NOT DETECTED NOT DETECTED Final   Streptococcus species NOT DETECTED NOT DETECTED Final   Streptococcus agalactiae NOT DETECTED NOT DETECTED Final   Streptococcus pneumoniae NOT DETECTED NOT DETECTED Final   Streptococcus pyogenes NOT DETECTED NOT DETECTED Final   Acinetobacter baumannii NOT DETECTED NOT DETECTED Final   Enterobacteriaceae species DETECTED (A) NOT DETECTED Final    Comment: Enterobacteriaceae represent a large family of gram-negative bacteria, not a single organism. CRITICAL RESULT CALLED TO, READ BACK BY AND VERIFIED WITH: LISA RUSSELL,RN _1  09/06/16  MKELLY,MLT    Enterobacter cloacae complex NOT DETECTED NOT DETECTED Final   Escherichia coli DETECTED (A) NOT DETECTED Final    Comment: CRITICAL RESULT CALLED TO, READ BACK BY AND VERIFIED WITH: LISA RUSSELL,RN _2  09/06/16 MKELLY,MLT    Klebsiella oxytoca NOT DETECTED NOT DETECTED Final   Klebsiella pneumoniae NOT DETECTED NOT DETECTED Final   Proteus species NOT DETECTED NOT DETECTED Final   Serratia marcescens NOT DETECTED NOT DETECTED Final   Carbapenem resistance NOT DETECTED NOT DETECTED Final   Haemophilus influenzae NOT DETECTED NOT DETECTED Final   Neisseria meningitidis NOT DETECTED NOT DETECTED Final   Pseudomonas aeruginosa NOT DETECTED NOT DETECTED Final   Candida albicans NOT DETECTED NOT DETECTED Final   Candida glabrata NOT DETECTED NOT DETECTED Final   Candida krusei NOT DETECTED NOT DETECTED Final   Candida parapsilosis NOT DETECTED NOT DETECTED Final   Candida tropicalis NOT DETECTED NOT DETECTED Final    Comment: Performed at [facility], [place] [address] [place], [place] [address]  Culture, blood (Routine X 2) w Reflex to ID Panel     Status: None (Preliminary result)   Collection Time: 09/05/16 10:47 AM  Result Value Ref Range Status   Specimen Description BLOOD RIGHT HAND  Final   Special Requests   Final    BOTTLES DRAWN AEROBIC AND ANAEROBIC Blood Culture adequate volume   Culture  Setup Time   Final    GRAM NEGATIVE RODS Performed at [facility] Gram Stain Report Called to,Read Back By and Verified With: RUSSELL,L AT 2230 ON 4.9.2018 BY ISLEY,B IN BOTH AEROBIC AND ANAEROBIC BOTTLES CRITICAL VALUE NOTED.  VALUE IS CONSISTENT WITH PREVIOUSLY REPORTED AND CALLED VALUE. Performed at [facility], [address] [place], [place] [address]    Culture PENDING  Incomplete   Report Status PENDING  Incomplete   Studies/Results: US Renal  Result Date: 09/05/2016 CLINICAL DATA:  Hematuria.  Acute kidney injury. EXAM: RENAL / URINARY TRACT  ULTRASOUND COMPLETE COMPARISON:  Abdominal ultrasound 05/14/2014 FINDINGS: Right Kidney: Length: 9.6 cm. Echogenicity within normal limits. No hydronephrosis. 3.0 x 2.6 x 3.0 cm interpolar cyst. Left Kidney: Length: 9.4 cm. Echogenicity within normal limits. No hydronephrosis. 2.1 x 1.7 x 1.3 cm interpolar cyst. Both renal cysts are mildly larger than on the 2015 examination, although some of the apparent change likely reflects differences in measurement technique. Bladder: Nondistended. IMPRESSION: 1. No hydronephrosis. 2. Bilateral renal cysts. Electronically Signed   By: Logan Bores M.D.   On: 09/05/2016 09:27   Dg Chest Port 1 View  Result Date: 09/05/2016 CLINICAL DATA:  Fever, dehydration, acute kidney injury EXAM: PORTABLE CHEST 1 VIEW COMPARISON:  07/21/2016 FINDINGS: Cardiomediastinal silhouette is stable. Single lead cardiac pacemaker is unchanged in position. Status post median sternotomy. No infiltrate or pleural effusion. No pulmonary edema. IMPRESSION: No active disease. Electronically Signed   By: Lahoma Crocker M.D.   On: 09/05/2016 11:28    Medications:  Prior to Admission:  Prescriptions Prior to Admission  Medication Sig Dispense Refill Last Dose  . acetaminophen (TYLENOL) 325 MG tablet Take 2 tablets (650 mg total) by mouth every 4 (four) hours as needed for mild pain (or temp > 37.5 C (99.5 F)).   unknown  . aspirin 325 MG tablet Take 1 tablet (325 mg total) by mouth daily.   unknown  . atorvastatin (LIPITOR) 80 MG tablet Take 80 mg by mouth daily.    09/04/2016 at Unknown time  . carvedilol (COREG) 25 MG tablet Take 12.5 mg by mouth 2 (two) times daily with a meal.    09/04/2016 at 1700  . Cholecalciferol (VITAMIN D3) 2000 UNITS TABS Take 2,000 mg by mouth daily.   09/04/2016 at Unknown time  . digoxin (LANOXIN) 0.125 MG tablet TAKE 1/2 TABLET BY MOUTH DAILY 15 tablet 3 09/04/2016 at Unknown time  . feeding supplement, ENSURE ENLIVE, (ENSURE ENLIVE) LIQD Take 237 mLs by mouth 2 (two) times  daily between meals. 237 mL 12 09/04/2016 at Unknown time  . ferrous sulfate 325 (65 FE) MG tablet Take 325 mg by mouth daily with breakfast.   09/04/2016 at Unknown time  . furosemide (LASIX) 40 MG tablet Take 40 mg by mouth daily.   09/04/2016 at Unknown time  . isosorbide dinitrate (ISORDIL) 20 MG tablet Take 40 mg by mouth 3 (three) times daily.    09/04/2016 at Unknown time  . mirabegron ER (MYRBETRIQ) 25 MG TB24 tablet Take 25 mg by mouth daily.   09/04/2016 at Unknown time  . mirtazapine (REMERON) 7.5 MG tablet Take 1 tablet by mouth daily.   09/04/2016 at Unknown time  . Multiple Vitamin (MULTIVITAMIN WITH MINERALS) TABS tablet Take 1 tablet by mouth daily.   09/04/2016 at Unknown time  . pantoprazole (PROTONIX) 40 MG tablet TAKE ONE TABLET   BY MOUTH   DAILY 30 tablet 11 09/04/2016 at Unknown time   Scheduled: . atorvastatin  80 mg Oral Daily  . carvedilol  3.125 mg Oral BID WC  . insulin aspart  0-9 Units Subcutaneous TID WC  . isosorbide dinitrate  10 mg Oral TID  . mirabegron ER  25 mg Oral Daily  . mirtazapine  7.5 mg Oral Daily  . pantoprazole  40 mg Oral Daily  . piperacillin-tazobactam (ZOSYN)  IV  2.25 g Intravenous Q8H  . [START ON 09/07/2016] vancomycin  500 mg Intravenous Q48H   Continuous: . dextrose 5 % and 0.9% NaCl 75 mL/hr at 09/06/16 0412   EBV:[id number], ondansetron **OR** ondansetron (ZOFRAN) IV  Assesment:He was admitted with what is present to be a acute kidney injury from dehydration. He is receiving IV fluids. His kidney function is about the same today as yesterday. He has severe protein calorie malnutrition. He has positive blood culture with gram-negative rods. This is consistent with his presumed urinary tract infection. Palliative care consultation is underway. He may need renal consult but I will wait until decisions are made regarding palliative care etc. Active Problems:   CKD (chronic kidney disease)   Diabetes mellitus type 2 in nonobese (HCC)   Essential  hypertension, benign   Hyperkalemia   CAD (coronary artery disease)   History of stroke   Anemia of chronic kidney failure   Severe malnutrition (HCC)   AKI (acute kidney injury) ([place])   Palliative care encounter   Goals of care, counseling/discussion   DNR (do not resuscitate) discussion   Protein-calorie malnutrition, severe    Plan: Continue current treatments continue antibiotics I increased his fluids a little bit.    LOS: 1 day   Karlissa Aron L 09/06/2016, 8:35 AM

## 2016-09-06 NOTE — Progress Notes (Signed)
Pharmacy Antibiotic Note  Keith Young is a 66 y.o. male admitted on 09/04/2016 with UTI and possible other site of infection / bacteremia.  Pharmacy has been consulted for ROCEPHIN dosing.  Plan: Rocephin 2gm IV q24hrs F/U cxs and clinical progress Monitor V/S, labs, and levels as indicated  Height: 5\' 7"  (170.2 cm) Weight: 103 lb 12.8 oz (47.1 kg) IBW/kg (Calculated) : 66.1  Temp (24hrs), Avg:98.7 F (37.1 C), Min:98.1 F (36.7 C), Max:99.9 F (37.7 C)   Recent Labs Lab 09/04/16 1910 09/05/16 0542 09/06/16 0558  WBC 3.0* 3.1*  --   CREATININE 4.41* 3.71* 3.85*    Estimated Creatinine Clearance: 12.9 mL/min (A) (by C-G formula based on SCr of 3.85 mg/dL (H)).    No Known Allergies  Antimicrobials this admission: Vancomycin 4/9 >> 4/10 Zosyn 4/9 >> 4/10 Rocephin 4/10 >>  Dose adjustments this admission: N/A  Microbiology results: 4/9 BCx: pending Lucianne Muss) 4/8 UCx: pending  Final result Visible to patient:  No (Not Released) Next appt:  09/12/2016 at 09:10 AM in Oncology ([facility])   Ref Range & Units 1d ago  Enterococcus species NOT DETECTED NOT DETECTED   Listeria monocytogenes NOT DETECTED NOT DETECTED   Staphylococcus species NOT DETECTED NOT DETECTED   Staphylococcus aureus NOT DETECTED NOT DETECTED   Streptococcus species NOT DETECTED NOT DETECTED   Streptococcus agalactiae NOT DETECTED NOT DETECTED   Streptococcus pneumoniae NOT DETECTED NOT DETECTED   Streptococcus pyogenes NOT DETECTED NOT DETECTED   Acinetobacter baumannii NOT DETECTED NOT DETECTED   Enterobacteriaceae species NOT DETECTED DETECTED    Comments: Enterobacteriaceae represent a large family of gram-negative bacteria, not a single organism.  CRITICAL RESULT CALLED TO, READ BACK BY AND VERIFIED WITH:  LISA RUSSELL,RN @0417  09/06/16 MKELLY,MLT   Enterobacter cloacae complex NOT DETECTED NOT DETECTED   Escherichia coli NOT DETECTED DETECTED    Comments: CRITICAL RESULT CALLED TO,  READ BACK BY AND VERIFIED WITH:  LISA RUSSELL,RN @0417  09/06/16 MKELLY,MLT   Klebsiella oxytoca NOT DETECTED NOT DETECTED   Klebsiella pneumoniae NOT DETECTED NOT DETECTED   Proteus species NOT DETECTED NOT DETECTED   Serratia marcescens NOT DETECTED NOT DETECTED   Carbapenem resistance NOT DETECTED NOT DETECTED   Haemophilus influenzae NOT DETECTED NOT DETECTED   Neisseria meningitidis NOT DETECTED NOT DETECTED   Pseudomonas aeruginosa NOT DETECTED NOT DETECTED   Candida albicans NOT DETECTED NOT DETECTED   Candida glabrata NOT DETECTED NOT DETECTED   Candida krusei NOT DETECTED NOT DETECTED   Candida parapsilosis NOT DETECTED NOT DETECTED   Candida tropicalis NOT DETECTED NOT DETECTED        Thank you for allowing pharmacy to be a part of this patient's care.  Hart Robinsons, PharmD Clinical Pharmacist Pager:  [phone] 09/06/2016   09/06/2016 1:08 PM

## 2016-09-06 NOTE — Progress Notes (Signed)
Daily Progress Note   Patient Name: Keith Young       Date: 09/06/2016 DOB: [birth year]  Age: 66 y.o. MRN#: [id number] Attending Physician: Sinda Du, MD Primary Care Physician: [name], MD Admit Date: 09/04/2016  Reason for Consultation/Follow-up: Establishing goals of care, Hospice Evaluation and Psychosocial/spiritual support  Subjective: Keith Young is resting quietly in bed. He is able to greet me and make eye contact. He is difficult to understand, and it's difficult for him to make his needs known. Present at bedside today are brother Keith Young, and sister Keith Young "Keith Young" [name]. We go to my office for a family meeting.  We talk about Keith Young chronic and acute health history. We talk about his decline over the last few years, his history of stroke and immobility. Keith Young tells of his decline including weight loss and current bed bound status, when he was transition to [facility] as residential/custodial care. Brother Keith Young states that he has seen a decline also. I share a diagram of the chronic illness pathway, we talk about what is normal and expected. Keith Young states that there were 16 children and their family, 5 have passed, most live in [place].   Keith Young states that she and her husband come to the SNF usually once to twice per day to help with Keith Young care.  We talk about his weight loss, and decreased appetite. Brother Keith Young was feeding Keith Young when I arrived, he only ate about half the tray. They say a normal weight for him is around 150 to 160 pounds, he is 103 at this time. We talk about his kidney function, and my concern that he may have a new baseline for his kidney disease (worsened).  We talk about making choices for Keith Young. I encourage family to think about if  treatments or medication will change what's happening, and what would Keith Young of 5 to 10 years ago say about where he is with his life now.    We talk about bacteremia, his AICD, if any further testing such as TEE would be done.  We talk about PEG tube, that this is not recommended, and would not change what is happening (continued declines). I share with family that they has the right and responsibility to say what this time looks like for Keith Young. I state that  we can focus on comfort and dignity, and have hospice services at [facility] at no additional cost.  I share with Keith Young that I think some of the changes where seeing related to his eating could quite likely be a dementia. Keith Young states that she agrees.  Brother Keith Young states that he wants to continue to treat Keith Young until he passes. Sister Keith Young, who is main caregiver states that she wants to talk with her husband but, "I know what I'm going to do".  She, at this point, seems to be leaning toward comfort and dignity for her brother, hospice at [facility]. We plan another family meeting 4/11 at 1 PM.  I will contact Dr. Luan Young in the morning for an update, or he can text/call me at [phone].    Length of Stay: 1  Current Medications: Scheduled Meds:  . atorvastatin  80 mg Oral Daily  . carvedilol  3.125 mg Oral BID WC  . cefTRIAXone (ROCEPHIN)  IV  2 g Intravenous Q24H  . insulin aspart  0-9 Units Subcutaneous TID WC  . isosorbide dinitrate  10 mg Oral TID  . mirabegron ER  25 mg Oral Daily  . mirtazapine  7.5 mg Oral Daily  . pantoprazole  40 mg Oral Daily    Continuous Infusions: . dextrose 5 % and 0.9% NaCl 100 mL/hr at 09/06/16 1551    PRN Meds: acetaminophen **OR** acetaminophen, ondansetron **OR** ondansetron (ZOFRAN) IV  Physical Exam  Constitutional: No distress.  Frail and thin, chronically ill appearing  HENT:  Head: Normocephalic and atraumatic.  Severe temporal wasting  Cardiovascular: Normal rate.     Pulmonary/Chest: Effort normal. No respiratory distress.  Abdominal: Soft. He exhibits no distension.  Musculoskeletal: He exhibits no edema.  Severe muscle wasting, temporal wasting  Neurological: He is alert.  Oriented to person and place but not time  Skin: Skin is warm and dry.            Vital Signs: BP (!) 163/129 (BP Location: Right Arm)   Pulse 82   Temp 99.9 F (37.7 C) (Oral)   Resp 18   Ht 5\' 7"  (1.702 m)   Wt 47.1 kg (103 lb 12.8 oz)   SpO2 (!) 83%   BMI 16.26 kg/m  SpO2: SpO2: (!) 83 % O2 Device: O2 Device: Nasal Cannula O2 Flow Rate: O2 Flow Rate (L/min): 3 L/min  Intake/output summary:  Intake/Output Summary (Last 24 hours) at 09/06/16 1747 Last data filed at 09/06/16 1700  Gross per 24 hour  Intake          2240.42 ml  Output              150 ml  Net          2090.42 ml   LBM: Last BM Date: 09/04/16 Baseline Weight: Weight: 49.9 kg (110 lb) Most recent weight: Weight: 47.1 kg (103 lb 12.8 oz)       Palliative Assessment/Data:    Flowsheet Rows     Most Recent Value  Intake Tab  Referral Department  Pulmonary  Unit at Time of Referral  Med/Surg Unit  Palliative Care Primary Diagnosis  Cardiac  Date Notified  09/05/16  Palliative Care Type  New Palliative care  Reason for referral  Clarify Goals of Care  Date of Admission  09/04/16  Date first seen by Palliative Care  09/05/16  # of days Palliative referral response time  0 Day(s)  # of days IP prior to Palliative referral  1  Clinical Assessment  Palliative Performance Scale Score  20%  Pain Max last 24 hours  Not able to report  Pain Min Last 24 hours  Not able to report  Dyspnea Max Last 24 Hours  Not able to report  Dyspnea Min Last 24 hours  Not able to report  Psychosocial & Spiritual Assessment  Palliative Care Outcomes  Patient/Family meeting held?  Yes  Who was at the meeting?  with brother in law, Jeronimo Greaves via phone, face to face sched for 4/10 at 1300  Palliative Care  Outcomes  Provided psychosocial or spiritual support, Clarified goals of care  Patient/Family wishes: Interventions discontinued/not started   Mechanical Ventilation  Palliative Care follow-up planned  -- [Follow-up while at [facility]]      Patient Active Problem List   Diagnosis Date Noted  . Protein-calorie malnutrition, severe 09/06/2016  . Palliative care encounter   . Goals of care, counseling/discussion   . DNR (do not resuscitate) discussion   . AKI (acute kidney injury) ([place]) 09/04/2016  . Malnutrition of moderate degree 06/28/2016  . Altered mental status 06/26/2016  . CVA (cerebral vascular accident) ([place]) 06/26/2016  . Severe malnutrition ([place]) 06/26/2016  . HCAP (healthcare-associated pneumonia) 06/04/2016  . Mucosal abnormality of stomach   . Gastritis and gastroduodenitis 09/30/2015  . Heme positive stool 09/30/2015  . Pancytopenia ([place]) 09/30/2015  . Chest pain 04/28/2015  . Elevated troponin 04/28/2015  . ICD (implantable cardioverter-defibrillator) in place 01/22/2015  . Anemia 09/30/2014  . Gastric polyp   . Reflux esophagitis   . Elevated LFTs 03/26/2014  . Anemia in chronic kidney disease 03/26/2014  . Carotid bruit 12/18/2013  . Acute on chronic systolic heart failure ([place]) 10/24/2013  . CHF (congestive heart failure), NYHA class III ([place]) 10/23/2013  . Cough 10/23/2013  . CKD (chronic kidney disease) 10/23/2013  . Diabetes mellitus type 2 in nonobese ([place]) 10/23/2013  . Essential hypertension, benign 10/23/2013  . Hyperkalemia 10/23/2013  . CAD (coronary artery disease) 10/23/2013  . History of stroke 10/23/2013  . Anemia of chronic kidney failure 10/23/2013  . Other and unspecified hyperlipidemia 10/23/2013    Palliative Care Assessment & Plan   Patient Profile: 66 y.o. male  with past medical history of Stroke with right-sided weakness, PNC since January 2018 (left a group home of 2 years due to being non-ambulatory), high blood pressure and high  cholesterol, COPD, stage III chronic kidney disease with anemia of chronic disease with Aranesp injections Q2 weeks, chronic systolic CHF class III, alcoholism in remission,, status post AICD admitted on 09/04/2016 with Acute kidney injury on chronic kidney disease stage III.   Assessment: Acute kidney injury; no great improvement in kidney function, continue testing. Family is considering focusing on comfort and dignity with hospice in place.  Recommendations/Plan: family meeting again 4/11 at 1 PM. Today we discussed the treatment of bacteremia, considering treatment options for bacteremia, considering nutritional status, considering kidney status. Sister Keith Young states that she is sitting the decline for her brother over the last few years.  Goals of Care and Additional Recommendations:  Limitations on Scope of Treatment: No extraordinary measures such as CPR for intubation. Considering future options for PEG tube, hemodialysis, versus hospice at [facility].  Code Status:    Code Status Orders        Start     Ordered   09/04/16 2301  Do not attempt resuscitation (DNR)  Continuous    Question Answer Comment  In the event of cardiac  or respiratory ARREST Do not call a "code blue"   In the event of cardiac or respiratory ARREST Do not perform Intubation, CPR, defibrillation or ACLS   In the event of cardiac or respiratory ARREST Use medication by any route, position, wound care, and other measures to relive pain and suffering. May use oxygen, suction and manual treatment of airway obstruction as needed for comfort.      09/04/16 2301    Code Status History    Date Active Date Inactive Code Status Order ID Comments User Context   06/26/2016 10:13 PM 06/30/2016  1:52 PM Full Code [id number]  Karmen Bongo, MD Inpatient   06/04/2016  2:43 AM 06/08/2016  2:50 PM DNR [id number]  Orvan Falconer, MD Inpatient   04/28/2015 11:29 PM 04/29/2015  4:17 PM Full Code [id number]  Oswald Hillock, MD Inpatient   02/06/2014  10:43 AM 02/06/2014  4:29 PM Full Code [id number]  Larey Dresser, MD Inpatient   10/23/2013  7:33 PM 10/28/2013  3:01 PM Full Code [id number]  Louellen Molder, MD Inpatient    Advance Directive Documentation     Most Recent Value  Type of Advance Directive  Out of facility DNR (pink MOST or yellow form)  Pre-existing out of facility DNR order (yellow form or pink MOST form)  Physician notified to receive inpatient order  "MOST" Form in Place?  -       Prognosis:   < 3 months, would not be surprising based on 3 hospitalizations in the last 4 months, frailty, malnutrition, bedbound state.  Discharge Planning:  Sr. Mortimer Fries, who is the main decision-maker, is leaning toward comfort and dignity for her brother.  Care plan was discussed with nursing staff, case manager, social worker, and Dr. Luan Young on next rounds.  Thank you for allowing the Palliative Medicine Team to assist in the care of this patient.   Time In: 1320 Time Out: 1420 Total Time 60 minutes  Prolonged Time Billed  yes       Greater than 50%  of this time was spent counseling and coordinating care related to the above assessment and plan.  Drue Novel, NP  Please contact Palliative Medicine Team phone at [phone] for questions and concerns.

## 2016-09-06 NOTE — Care Management Note (Signed)
Case Management Note  Patient Details  Name: Keith Young MRN: [id number] Date of Birth: [birth year]  Subjective/Objective:                  Admitted with AKI. Pt from [facility]. CSW aware. Pallitiave involved.   Action/Plan: Anticipate return to SNF. CSW following, CM following.   Expected Discharge Date:     09/08/2016             Expected Discharge Plan:  [place]  In-House Referral:  Clinical Social Work, Hospice / Palliative Care  Discharge planning Services  CM Consult  Status of Service:  In process, will continue to follow  Sherald Barge, RN 09/06/2016, 10:15 AM

## 2016-09-06 NOTE — Progress Notes (Addendum)
      INFECTIOUS DISEASE ATTENDING ADDENDUM:   Date: 09/06/2016  Patient name: Keith Young  Medical record number: [id number]  Date of birth: [birth year]   Patient is 66 year old man with apparently newly diagnosed HIV infection. Likely low CD4 count. He appears t have multiple medical problems including CAD, COPD> His HIV seems likely undiagnosed and untreated.   I would question whether treatment of his HIV infection might improve his overall condition.   I am ordering additional labs with am labs and will order antivirals to start him on. I will get in touch with Dr. Luan Pulling in the am  If ID consultation in person is desired I would strongly recommend transfer to [facility] or [facility]. Certainly given undeserved stigma of this diagnosis I am sure he will prefer his confidentiality be maintained (though he will have to inform sexual partners ) he may prefer not to have drama of a transfer. I will again discuss with the primary team in the am.   He also has E coli bacteremia in 2/2 cultures.     Rhina Brackett [place] 09/06/2016, 11:05 PM

## 2016-09-07 ENCOUNTER — Encounter (HOSPITAL_COMMUNITY)
Admission: RE | Admit: 2016-09-07 | Discharge: 2016-09-07 | Disposition: A | Discharge status: Home / Self Care | Payer: Medicare HMO | Source: Skilled Nursing Facility | Attending: Pulmonary Disease | Admitting: Pulmonary Disease

## 2016-09-07 DIAGNOSIS — Z95810 Presence of automatic (implantable) cardiac defibrillator: Secondary | ICD-10-CM | POA: Insufficient documentation

## 2016-09-07 DIAGNOSIS — R278 Other lack of coordination: Secondary | ICD-10-CM | POA: Insufficient documentation

## 2016-09-07 DIAGNOSIS — N183 Chronic kidney disease, stage 3 (moderate): Secondary | ICD-10-CM

## 2016-09-07 DIAGNOSIS — D631 Anemia in chronic kidney disease: Secondary | ICD-10-CM | POA: Insufficient documentation

## 2016-09-07 DIAGNOSIS — R1312 Dysphagia, oropharyngeal phase: Secondary | ICD-10-CM | POA: Insufficient documentation

## 2016-09-07 DIAGNOSIS — B20 Human immunodeficiency virus [HIV] disease: Secondary | ICD-10-CM

## 2016-09-07 DIAGNOSIS — R262 Difficulty in walking, not elsewhere classified: Secondary | ICD-10-CM | POA: Insufficient documentation

## 2016-09-07 DIAGNOSIS — I639 Cerebral infarction, unspecified: Secondary | ICD-10-CM | POA: Insufficient documentation

## 2016-09-07 DIAGNOSIS — Z7189 Other specified counseling: Secondary | ICD-10-CM

## 2016-09-07 LAB — RENAL FUNCTION PANEL
ALBUMIN: 2 g/dL — AB (ref 3.5–5.0)
ANION GAP: 10 (ref 5–15)
BUN: 140 mg/dL — ABNORMAL HIGH (ref 6–20)
CO2: 16 mmol/L — ABNORMAL LOW (ref 22–32)
Calcium: 7.8 mg/dL — ABNORMAL LOW (ref 8.9–10.3)
Chloride: 115 mmol/L — ABNORMAL HIGH (ref 101–111)
Creatinine, Ser: 4.74 mg/dL — ABNORMAL HIGH (ref 0.61–1.24)
GFR calc Af Amer: 14 mL/min — ABNORMAL LOW (ref >60)
GFR calc non Af Amer: 12 mL/min — ABNORMAL LOW (ref >60)
GLUCOSE: 162 mg/dL — AB (ref 65–99)
PHOSPHORUS: 6.7 mg/dL — AB (ref 2.5–4.6)
POTASSIUM: 4.6 mmol/L (ref 3.5–5.1)
Sodium: 141 mmol/L (ref 135–145)

## 2016-09-07 LAB — GLUCOSE, CAPILLARY
GLUCOSE-CAPILLARY: 131 mg/dL — AB (ref 65–99)
Glucose-Capillary: 169 mg/dL — ABNORMAL HIGH (ref 65–99)
Glucose-Capillary: 86 mg/dL (ref 65–99)
Glucose-Capillary: 99 mg/dL (ref 65–99)

## 2016-09-07 LAB — HEMOGLOBIN AND HEMATOCRIT, BLOOD
HCT: 35 % — ABNORMAL LOW (ref 39.0–52.0)
Hemoglobin: 11.4 g/dL — ABNORMAL LOW (ref 13.0–17.0)

## 2016-09-07 LAB — IRON AND TIBC
IRON: 38 ug/dL — AB (ref 45–182)
Saturation Ratios: 26 % (ref 17.9–39.5)
TIBC: 147 ug/dL — ABNORMAL LOW (ref 250–450)
UIBC: 109 ug/dL

## 2016-09-07 LAB — FOLATE: Folate: 19 ng/mL (ref >5.9)

## 2016-09-07 LAB — FERRITIN: Ferritin: 402 ng/mL — ABNORMAL HIGH (ref 24–336)

## 2016-09-07 MED ORDER — SODIUM CHLORIDE 0.9 % IV BOLUS (SEPSIS)
500.0000 mL | Freq: Once | INTRAVENOUS | Status: AC
Start: 2016-09-07 — End: 2016-09-07
  Administered 2016-09-07: 500 mL via INTRAVENOUS

## 2016-09-07 MED ORDER — SODIUM CHLORIDE 0.9 % IV BOLUS (SEPSIS)
500.0000 mL | Freq: Once | INTRAVENOUS | Status: AC
  Administered 2016-09-07: 500 mL via INTRAVENOUS

## 2016-09-07 MED ORDER — SODIUM BICARBONATE 8.4 % IV SOLN
INTRAVENOUS | Status: DC
  Administered 2016-09-07 – 2016-09-10 (×7): via INTRAVENOUS
  Filled 2016-09-07 (×13): qty 1000

## 2016-09-07 NOTE — Progress Notes (Signed)
Notified by palliative care NP that patient's blood pressure parameters are 70/50 as agreed upon with nephrologist. Will continue to monitor patient and blood pressure.

## 2016-09-07 NOTE — Progress Notes (Signed)
Pt's vitals are as follows   09/07/16 0814  Vitals  Temp 98.7 F (37.1 C)  Temp Source Oral  BP 98/78  BP Location Right Arm  BP Method Automatic  Patient Position (if appropriate) Lying  Pulse Rate 87  Pulse Rate Source Dinamap  Cardiac Rhythm Atrial fibrillation  Resp 18  Oxygen Therapy  SpO2 100 %  O2 Device Nasal Cannula  O2 Flow Rate (L/min) 3 L/min  Pain Assessment  Pain Assessment No/denies pain  Pain Score 0   MD verbally informed and received verbal order to administer 500 cc bolus normal saline.

## 2016-09-07 NOTE — Consult Note (Signed)
Keith Young MRN: [id number] DOB/AGE: January 24, 1951 66 y.o. Primary Care Physician:HAWKINS,EDWARD L, MD Admit date: 09/04/2016 Chief Complaint:  Chief Complaint  Patient presents with  . Hematuria   HPI: Pt is 66 year old male with past medical hx of CKD stage 3, Cardiomyopathy who came to ER with c/o  Hematuria.  HPI dates back to April 8th when staff at [facility] notice  Blood in the diaper.Pt was sent to ER for cath placement. Upon evaluation in ER pt was found to have high creat and admitted for further care. Pt was also noticed to be hypotensive . Pt was also noticed to be not eating. Urology was consulted and pt had difficult catheterization. In the interim pt was febrile and blood cultures came back positive fopr gram negative rods. Pt was later also diagnosed with HIV Pt seen today opn 3rd floor. Pt does not voice any concerns.   Past Medical History:  Diagnosis Date  . AICD (automatic cardioverter/defibrillator) present   . Alcoholism in remission ([place])   . Anemia   . Aortic insufficiency   . Arthritis   . At moderate risk for fall   . Bell's palsy   . Cardiomyopathy ([place])   . Chronic systolic heart failure (HCC)    NYHA Class III  . CKD (chronic kidney disease) stage 3, GFR 30-59 ml/min   . COPD (chronic obstructive pulmonary disease) ([place])   . Essential hypertension, benign   . HIV disease ([place]) 09/06/2016  . Hyperlipidemia   . Noncompliance with medication regimen   . Nonischemic cardiomyopathy (HCC)    EF 15-20%  . NSVT (nonsustained ventricular tachycardia) ([place])   . Numbness of right jaw    Had a stoke in 01/2013. Numbness is occasional, especially when trying to chew.  . Productive cough 08/2013   With brown sputum   . SOB (shortness of breath) on exertion 08/2013  . Stroke ([place]) 01/2013   weakness of right side from CVA  . Type 2 diabetes mellitus ([place])   . Uses hearing aid 2014   recently received new hearing aids        Family  History  Problem Relation Age of Onset  . Kidney disease Mother   . Kidney disease Sister   . Colon cancer Neg Hx     Social History:  reports that he quit smoking about 22 years ago. His smoking use included Cigarettes. He has never used smokeless tobacco. He reports that he drinks alcohol. He reports that he does not use drugs.   Allergies: No Known Allergies  Medications Prior to Admission  Medication Sig Dispense Refill  . acetaminophen (TYLENOL) 325 MG tablet Take 2 tablets (650 mg total) by mouth every 4 (four) hours as needed for mild pain (or temp > 37.5 C (99.5 F)).    Marland Kitchen aspirin 325 MG tablet Take 1 tablet (325 mg total) by mouth daily.    Marland Kitchen atorvastatin (LIPITOR) 80 MG tablet Take 80 mg by mouth daily.     . carvedilol (COREG) 25 MG tablet Take 12.5 mg by mouth 2 (two) times daily with a meal.     . Cholecalciferol (VITAMIN D3) 2000 UNITS TABS Take 2,000 mg by mouth daily.    . digoxin (LANOXIN) 0.125 MG tablet TAKE 1/2 TABLET BY MOUTH DAILY 15 tablet 3  . feeding supplement, ENSURE ENLIVE, (ENSURE ENLIVE) LIQD Take 237 mLs by mouth 2 (two) times daily between meals. 237 mL 12  . ferrous sulfate 325 (65  FE) MG tablet Take 325 mg by mouth daily with breakfast.    . furosemide (LASIX) 40 MG tablet Take 40 mg by mouth daily.    . isosorbide dinitrate (ISORDIL) 20 MG tablet Take 40 mg by mouth 3 (three) times daily.     . mirabegron ER (MYRBETRIQ) 25 MG TB24 tablet Take 25 mg by mouth daily.    . mirtazapine (REMERON) 7.5 MG tablet Take 1 tablet by mouth daily.    . Multiple Vitamin (MULTIVITAMIN WITH MINERALS) TABS tablet Take 1 tablet by mouth daily.    . pantoprazole (PROTONIX) 40 MG tablet TAKE ONE TABLET   BY MOUTH   DAILY 30 tablet 11       Meds  . atorvastatin  80 mg Oral Daily  . carvedilol  3.125 mg Oral BID WC  . cefTRIAXone (ROCEPHIN)  IV  2 g Intravenous Q24H  . dolutegravir  50 mg Oral Daily  . emtricitabine-tenofovir AF  1 tablet Oral Daily  . insulin  aspart  0-9 Units Subcutaneous TID WC  . isosorbide dinitrate  10 mg Oral TID  . mirabegron ER  25 mg Oral Daily  . mirtazapine  7.5 mg Oral Daily  . pantoprazole  40 mg Oral Daily         LOV:FIEPPI to get data-as pt not lethargic  Physical Exam: Vital signs in last 24 hours: Temp:  [98 F (36.7 C)-99.9 F (37.7 C)] 98 F (36.7 C) (04/11 1300) Pulse Rate:  [74-87] 85 (04/11 1300) Resp:  [12-18] 15 (04/11 1300) BP: (71-163)/(53-129) 91/64 (04/11 1300) SpO2:  [83 %-100 %] 100 % (04/11 1300) Weight change:  Last BM Date: 09/07/16  Intake/Output from previous day: 04/10 0701 - 04/11 0700 In: 2466.7 [P.O.:240; I.V.:2176.7; IV Piggyback:50] Out: 250 [Urine:250] Total I/O In: 240 [P.O.:240] Out: -    Physical Exam: General- pt is lethargic, cachectic  Resp- No acute REsp distress, CTA B/L NO Rhonchi CVS- S1S2 regular in rate and rhythm GIT- BS+, soft, NT, ND EXT- NO LE Edema, NO Cyanosis     Lab Results: CBC  Recent Labs  09/04/16 1910 09/05/16 0542 09/07/16 0620  WBC 3.0* 3.1*  --   HGB 11.9* 10.9* 11.4*  HCT 36.8* 33.7* 35.0*  PLT 179 190  --     BMET  Recent Labs  09/06/16 0558 09/07/16 0620  NA 137 141  K 4.3 4.6  CL 109 115*  CO2 16* 16*  GLUCOSE 179* 162*  BUN 130* 140*  CREATININE 3.85* 4.74*  CALCIUM 8.2* 7.8*    Creat data 2018  4.4=>3.7=>4.7 2017  1.3--1.8 2016  1.7--2.6 2015  1.4--2.1   AG 141-131=10 Delta AG 10-6=4 Delta Bicarb  24-16=8    MICRO Recent Results (from the past 240 hour(s))  Culture, Urine     Status: Abnormal   Collection Time: 09/04/16  9:23 PM  Result Value Ref Range Status   Specimen Description URINE, CLEAN CATCH  Final   Special Requests NONE  Final   Culture MULTIPLE SPECIES PRESENT, SUGGEST RECOLLECTION (A)  Final   Report Status 09/06/2016 FINAL  Final  Culture, blood (Routine X 2) w Reflex to ID Panel     Status: Abnormal (Preliminary result)   Collection Time: 09/05/16 10:43 AM  Result  Value Ref Range Status   Specimen Description BLOOD RIGHT ARM  Final   Special Requests   Final    BOTTLES DRAWN AEROBIC AND ANAEROBIC Blood Culture adequate volume   Culture  Setup Time  Final    Performed at [facility] AT 2230 ON 4.9.2018 BY ISLEY,B IN BOTH AEROBIC AND ANAEROBIC BOTTLES Organism ID to follow CRITICAL RESULT CALLED TO, READ BACK BY AND VERIFIED WITH: LISA RUSSELL,RN @0417  09/06/16 MKELLY,MLT Performed at [facility], [place] [address] [place], [place] [address]    Culture ESCHERICHIA COLI (A)  Final   Report Status PENDING  Incomplete  Blood Culture ID Panel (Reflexed)     Status: Abnormal   Collection Time: 09/05/16 10:43 AM  Result Value Ref Range Status   Enterococcus species NOT DETECTED NOT DETECTED Final   Listeria monocytogenes NOT DETECTED NOT DETECTED Final   Staphylococcus species NOT DETECTED NOT DETECTED Final   Staphylococcus aureus NOT DETECTED NOT DETECTED Final   Streptococcus species NOT DETECTED NOT DETECTED Final   Streptococcus agalactiae NOT DETECTED NOT DETECTED Final   Streptococcus pneumoniae NOT DETECTED NOT DETECTED Final   Streptococcus pyogenes NOT DETECTED NOT DETECTED Final   Acinetobacter baumannii NOT DETECTED NOT DETECTED Final   Enterobacteriaceae species DETECTED (A) NOT DETECTED Final    Comment: Enterobacteriaceae represent a large family of gram-negative bacteria, not a single organism. CRITICAL RESULT CALLED TO, READ BACK BY AND VERIFIED WITH: LISA RUSSELL,RN @0417  09/06/16 MKELLY,MLT    Enterobacter cloacae complex NOT DETECTED NOT DETECTED Final   Escherichia coli DETECTED (A) NOT DETECTED Final    Comment: CRITICAL RESULT CALLED TO, READ BACK BY AND VERIFIED WITH: LISA RUSSELL,RN @0417  09/06/16 MKELLY,MLT    Klebsiella oxytoca NOT DETECTED NOT DETECTED Final   Klebsiella pneumoniae NOT DETECTED NOT DETECTED Final   Proteus species NOT DETECTED NOT DETECTED Final   Serratia  marcescens NOT DETECTED NOT DETECTED Final   Carbapenem resistance NOT DETECTED NOT DETECTED Final   Haemophilus influenzae NOT DETECTED NOT DETECTED Final   Neisseria meningitidis NOT DETECTED NOT DETECTED Final   Pseudomonas aeruginosa NOT DETECTED NOT DETECTED Final   Candida albicans NOT DETECTED NOT DETECTED Final   Candida glabrata NOT DETECTED NOT DETECTED Final   Candida krusei NOT DETECTED NOT DETECTED Final   Candida parapsilosis NOT DETECTED NOT DETECTED Final   Candida tropicalis NOT DETECTED NOT DETECTED Final    Comment: Performed at [facility], [place] [address] [place], [place] [address]  Culture, blood (Routine X 2) w Reflex to ID Panel     Status: Abnormal (Preliminary result)   Collection Time: 09/05/16 10:47 AM  Result Value Ref Range Status   Specimen Description BLOOD RIGHT HAND  Final   Special Requests   Final    BOTTLES DRAWN AEROBIC AND ANAEROBIC Blood Culture adequate volume   Culture  Setup Time   Final    GRAM NEGATIVE RODS Performed at [facility] Gram Stain Report Called to,Read Back By and Verified With: RUSSELL,L AT 2230 ON 4.9.2018 BY ISLEY,B IN BOTH AEROBIC AND ANAEROBIC BOTTLES CRITICAL VALUE NOTED.  VALUE IS CONSISTENT WITH PREVIOUSLY REPORTED AND CALLED VALUE. Performed at [facility], [place] [address] [place], [place] [address]    Culture ESCHERICHIA COLI (A)  Final   Report Status PENDING  Incomplete  MRSA PCR Screening     Status: None   Collection Time: 09/06/16  4:00 PM  Result Value Ref Range Status   MRSA by PCR NEGATIVE NEGATIVE Final    Comment:        The GeneXpert MRSA Assay (FDA approved for NASAL specimens only), is one component of a comprehensive MRSA colonization  surveillance program. It is not intended to diagnose MRSA infection nor to guide or monitor treatment for MRSA infections.       Lab Results  Component Value Date   PTH 19 08/08/2016   CALCIUM 7.8 (L) 09/07/2016   CAION 1.27 (H)  11/28/2015   PHOS 6.7 (H) 09/07/2016      Impression: 1)Renal  AKI secondary to Sepsis                AKi sec to ATN                AKI on CKD               CKD stage 3.               CKD since 2015               CKD secondary to HIV/DM/Multiple AKI/HTN                Progression of CKD marked with AKI                Proteinuria  Present .                Nephrolithiasis Hx Absent   2)HTN  BP on lower side  Medication- On Alpha and beta Blockers On Vasodilators.    3)Anemia HGb at goal (9--11)  4)CKD Mineral-Bone Disorder PTH low Secondary Hyperparathyroidism absent. Phosphorus not at goal.   5)ID-admitted with HIV Sepsis  Primary MD following  6)Electrolytes  Normokalemic   Was hyperkalemic earlier  NOrmonatremic   Was hyponatremic earlier  7)Acid base Co2 not  at goal  NON AG acidosis     8) Social-Pt with poor prognosis      Family considering Hospice but have not made decision yet   9) CHF  Systolic CHF       EF 32% in echo done in Jan 2018       [place] in fluid overload at this time  10)Arrhythmias-Pt admitted with Afib with RVR     Primary team follwoing    Plan:  Will add bicarb to IVF as pt has non AG acidosis. Agree with IVF rate Will ask for cortisol in am as pt hypotensive, hyponatremic and hyperkalemic I had exetensive discussion wioth pt family about his kidney isseus. Pt mother was on HD so they are well aware.        BHUTANI,MANPREET S 09/07/2016, 3:15 PM

## 2016-09-07 NOTE — Progress Notes (Signed)
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 He now has atrial fib with rapid ventricular response. He is asymptomatic from this. He denies any chest pain or any other symptoms now. I have given him fluid bolus. His renal function has not improved. I told him of the new diagnosis but I'm not sure if he understands.  Objective: Vital signs in last 24 hours: Temp:  [98.6 F (37 C)-99.9 F (37.7 C)] 98.7 F (37.1 C) (04/11 0814) Pulse Rate:  [74-105] 87 (04/11 0814) Resp:  [18] 18 (04/11 0814) BP: (71-163)/(53-129) 98/78 (04/11 0814) SpO2:  [83 %-100 %] 100 % (04/11 0814) Weight change:  Last BM Date: 09/04/16  Intake/Output from previous day: 04/10 0701 - 04/11 0700 In: 2466.7 [P.O.:240; I.V.:2176.7; IV Piggyback:50] Out: 250 [Urine:250]  PHYSICAL EXAM General appearance: alert, cachectic and mild distress Resp: rhonchi bilaterally Cardio: His heart is irregularly irregular with a heart rate of about 145 GI: soft, non-tender; bowel sounds normal; no masses,  no organomegaly Extremities: extremities normal, atraumatic, no cyanosis or edema Skin warm and dry.  Lab Results:  Results for orders placed or performed during the hospital encounter of 09/04/16 (from the past 48 hour(s))  Glucose, capillary     Status: None   Collection Time: 09/05/16  8:56 AM  Result Value Ref Range   Glucose-Capillary 76 65 - 99 mg/dL   Comment 1 Notify RN    Comment 2 Document in Chart   Culture, blood (Routine X 2) w Reflex to ID Panel     Status: None (Preliminary result)   Collection Time: 09/05/16 10:43 AM  Result Value Ref Range   Specimen Description BLOOD RIGHT ARM    Special Requests      BOTTLES DRAWN AEROBIC AND ANAEROBIC Blood Culture adequate volume   Culture  Setup Time      Performed at [facility] AT 2230 ON 4.9.2018 BY ISLEY,B IN BOTH AEROBIC AND ANAEROBIC  BOTTLES Organism ID to follow CRITICAL RESULT CALLED TO, READ BACK BY AND VERIFIED WITH: LISA RUSSELL,RN '@[id number]'  09/06/16 MKELLY,MLT Performed at [facility], [place] [address] [place], [place] [address]    Culture NO GROWTH < 24 HOURS    Report Status PENDING   Blood Culture ID Panel (Reflexed)     Status: Abnormal   Collection Time: 09/05/16 10:43 AM  Result Value Ref Range   Enterococcus species NOT DETECTED NOT DETECTED   Listeria monocytogenes NOT DETECTED NOT DETECTED   Staphylococcus species NOT DETECTED NOT DETECTED   Staphylococcus aureus NOT DETECTED NOT DETECTED   Streptococcus species NOT DETECTED NOT DETECTED   Streptococcus agalactiae NOT DETECTED NOT DETECTED   Streptococcus pneumoniae NOT DETECTED NOT DETECTED   Streptococcus pyogenes NOT DETECTED NOT DETECTED   Acinetobacter baumannii NOT DETECTED NOT DETECTED   Enterobacteriaceae species DETECTED (A) NOT DETECTED    Comment: Enterobacteriaceae represent a large family of gram-negative bacteria, not a single organism. CRITICAL RESULT CALLED TO, READ BACK BY AND VERIFIED WITH: LISA RUSSELL,RN '@[id number]'  09/06/16 MKELLY,MLT    Enterobacter cloacae complex NOT DETECTED NOT DETECTED   Escherichia coli DETECTED (A) NOT DETECTED    Comment: CRITICAL RESULT CALLED TO, READ BACK BY AND VERIFIED WITH: LISA RUSSELL,RN '@[id number]'  09/06/16 MKELLY,MLT    Klebsiella oxytoca NOT DETECTED NOT DETECTED   Klebsiella pneumoniae NOT DETECTED NOT DETECTED   Proteus species NOT DETECTED NOT DETECTED  Serratia marcescens NOT DETECTED NOT DETECTED   Carbapenem resistance NOT DETECTED NOT DETECTED   Haemophilus influenzae NOT DETECTED NOT DETECTED   Neisseria meningitidis NOT DETECTED NOT DETECTED   Pseudomonas aeruginosa NOT DETECTED NOT DETECTED   Candida albicans NOT DETECTED NOT DETECTED   Candida glabrata NOT DETECTED NOT DETECTED   Candida krusei NOT DETECTED NOT DETECTED   Candida parapsilosis NOT DETECTED NOT DETECTED   Candida  tropicalis NOT DETECTED NOT DETECTED    Comment: Performed at [facility] [address] [place], [place] [address]  Culture, blood (Routine X 2) w Reflex to ID Panel     Status: None (Preliminary result)   Collection Time: 09/05/16 10:47 AM  Result Value Ref Range   Specimen Description BLOOD RIGHT HAND    Special Requests      BOTTLES DRAWN AEROBIC AND ANAEROBIC Blood Culture adequate volume   Culture  Setup Time      GRAM NEGATIVE RODS Performed at [facility] Gram Stain Report Called to,Read Back By and Verified With: RUSSELL,L AT 2230 ON 4.9.2018 BY ISLEY,B IN BOTH AEROBIC AND ANAEROBIC BOTTLES CRITICAL VALUE NOTED.  VALUE IS CONSISTENT WITH PREVIOUSLY REPORTED AND CALLED VALUE. Performed at [facility], [place] [address], [place], [place] [address]    Culture NO GROWTH < 24 HOURS    Report Status PENDING   Glucose, capillary     Status: None   Collection Time: 09/05/16 11:31 AM  Result Value Ref Range   Glucose-Capillary 87 65 - 99 mg/dL   Comment 1 QC Due    Comment 2 Notify RN    Comment 3 Document in Chart   Glucose, capillary     Status: Abnormal   Collection Time: 09/05/16  4:43 PM  Result Value Ref Range   Glucose-Capillary 151 (H) 65 - 99 mg/dL   Comment 1 Notify RN   Sodium, urine, random     Status: None   Collection Time: 09/05/16  6:40 PM  Result Value Ref Range   Sodium, Ur 61 mmol/L  Creatinine, urine, random     Status: None   Collection Time: 09/05/16  6:40 PM  Result Value Ref Range   Creatinine, Urine 64.50 mg/dL  Osmolality, urine     Status: None   Collection Time: 09/05/16  6:40 PM  Result Value Ref Range   Osmolality, Ur 381 300 - 900 mOsm/kg    Comment: Performed at [facility], [place] [address] [place], [place] [address]  Glucose, capillary     Status: None   Collection Time: 09/05/16  9:05 PM  Result Value Ref Range   Glucose-Capillary 79 65 - 99 mg/dL   Comment 1 Notify RN    Comment 2 Document in Chart    Glucose, capillary     Status: None   Collection Time: 09/05/16  9:48 PM  Result Value Ref Range   Glucose-Capillary 75 65 - 99 mg/dL   Comment 1 Notify RN    Comment 2 Document in Chart   Glucose, capillary     Status: None   Collection Time: 09/05/16 11:59 PM  Result Value Ref Range   Glucose-Capillary 69 65 - 99 mg/dL   Comment 1 Notify RN    Comment 2 Document in Chart   Glucose, capillary     Status: Abnormal   Collection Time: 09/06/16  2:53 AM  Result Value Ref Range   Glucose-Capillary 130 (H) 65 - 99 mg/dL  Basic  metabolic panel     Status: Abnormal   Collection Time: 09/06/16  5:58 AM  Result Value Ref Range   Sodium 137 135 - 145 mmol/L   Potassium 4.3 3.5 - 5.1 mmol/L   Chloride 109 101 - 111 mmol/L   CO2 16 (L) 22 - 32 mmol/L   Glucose, Bld 179 (H) 65 - 99 mg/dL   BUN 130 (H) 6 - 20 mg/dL    Comment: RESULTS CONFIRMED BY MANUAL DILUTION   Creatinine, Ser 3.85 (H) 0.61 - 1.24 mg/dL   Calcium 8.2 (L) 8.9 - 10.3 mg/dL   GFR calc non Af Amer 15 (L) >60 mL/min   GFR calc Af Amer 18 (L) >60 mL/min    Comment: (NOTE) The eGFR has been calculated using the CKD EPI equation. This calculation has not been validated in all clinical situations. eGFR's persistently <60 mL/min signify possible Chronic Kidney Disease.    Anion gap 12 5 - 15  Glucose, capillary     Status: Abnormal   Collection Time: 09/06/16  7:34 AM  Result Value Ref Range   Glucose-Capillary 124 (H) 65 - 99 mg/dL   Comment 1 Notify RN    Comment 2 Document in Chart   Glucose, capillary     Status: Abnormal   Collection Time: 09/06/16 11:25 AM  Result Value Ref Range   Glucose-Capillary 155 (H) 65 - 99 mg/dL   Comment 1 Notify RN    Comment 2 Document in Chart   MRSA PCR Screening     Status: None   Collection Time: 09/06/16  4:00 PM  Result Value Ref Range   MRSA by PCR NEGATIVE NEGATIVE    Comment:        The GeneXpert MRSA Assay (FDA approved for NASAL specimens only), is one component of  a comprehensive MRSA colonization surveillance program. It is not intended to diagnose MRSA infection nor to guide or monitor treatment for MRSA infections.   Glucose, capillary     Status: Abnormal   Collection Time: 09/06/16  4:28 PM  Result Value Ref Range   Glucose-Capillary 116 (H) 65 - 99 mg/dL   Comment 1 Notify RN    Comment 2 Document in Chart   Glucose, capillary     Status: Abnormal   Collection Time: 09/06/16  8:58 PM  Result Value Ref Range   Glucose-Capillary 106 (H) 65 - 99 mg/dL   Comment 1 Notify RN    Comment 2 Document in Chart   Glucose, capillary     Status: Abnormal   Collection Time: 09/07/16  7:37 AM  Result Value Ref Range   Glucose-Capillary 169 (H) 65 - 99 mg/dL   Comment 1 Notify RN    Comment 2 Document in Chart     ABGS No results for input(s): PHART, PO2ART, TCO2, HCO3 in the last 72 hours.  Invalid input(s): PCO2 CULTURES Recent Results (from the past 240 hour(s))  Culture, Urine     Status: Abnormal   Collection Time: 09/04/16  9:23 PM  Result Value Ref Range Status   Specimen Description URINE, CLEAN CATCH  Final   Special Requests NONE  Final   Culture MULTIPLE SPECIES PRESENT, SUGGEST RECOLLECTION (A)  Final   Report Status 09/06/2016 FINAL  Final  Culture, blood (Routine X 2) w Reflex to ID Panel     Status: None (Preliminary result)   Collection Time: 09/05/16 10:43 AM  Result Value Ref Range Status   Specimen Description BLOOD RIGHT ARM  Final   Special Requests   Final    BOTTLES DRAWN AEROBIC AND ANAEROBIC Blood Culture adequate volume   Culture  Setup Time   Final    Performed at [facility] AT 2230 ON 4.9.2018 BY ISLEY,B IN BOTH AEROBIC AND ANAEROBIC BOTTLES Organism ID to follow CRITICAL RESULT CALLED TO, READ BACK BY AND VERIFIED WITH: LISA RUSSELL,RN '@[id number]'  09/06/16 MKELLY,MLT Performed at [facility], [place] [address] [place], [place] [address]    Culture NO GROWTH < 24  HOURS  Final   Report Status PENDING  Incomplete  Blood Culture ID Panel (Reflexed)     Status: Abnormal   Collection Time: 09/05/16 10:43 AM  Result Value Ref Range Status   Enterococcus species NOT DETECTED NOT DETECTED Final   Listeria monocytogenes NOT DETECTED NOT DETECTED Final   Staphylococcus species NOT DETECTED NOT DETECTED Final   Staphylococcus aureus NOT DETECTED NOT DETECTED Final   Streptococcus species NOT DETECTED NOT DETECTED Final   Streptococcus agalactiae NOT DETECTED NOT DETECTED Final   Streptococcus pneumoniae NOT DETECTED NOT DETECTED Final   Streptococcus pyogenes NOT DETECTED NOT DETECTED Final   Acinetobacter baumannii NOT DETECTED NOT DETECTED Final   Enterobacteriaceae species DETECTED (A) NOT DETECTED Final    Comment: Enterobacteriaceae represent a large family of gram-negative bacteria, not a single organism. CRITICAL RESULT CALLED TO, READ BACK BY AND VERIFIED WITH: LISA RUSSELL,RN '@[id number]'  09/06/16 MKELLY,MLT    Enterobacter cloacae complex NOT DETECTED NOT DETECTED Final   Escherichia coli DETECTED (A) NOT DETECTED Final    Comment: CRITICAL RESULT CALLED TO, READ BACK BY AND VERIFIED WITH: LISA RUSSELL,RN '@[id number]'  09/06/16 MKELLY,MLT    Klebsiella oxytoca NOT DETECTED NOT DETECTED Final   Klebsiella pneumoniae NOT DETECTED NOT DETECTED Final   Proteus species NOT DETECTED NOT DETECTED Final   Serratia marcescens NOT DETECTED NOT DETECTED Final   Carbapenem resistance NOT DETECTED NOT DETECTED Final   Haemophilus influenzae NOT DETECTED NOT DETECTED Final   Neisseria meningitidis NOT DETECTED NOT DETECTED Final   Pseudomonas aeruginosa NOT DETECTED NOT DETECTED Final   Candida albicans NOT DETECTED NOT DETECTED Final   Candida glabrata NOT DETECTED NOT DETECTED Final   Candida krusei NOT DETECTED NOT DETECTED Final   Candida parapsilosis NOT DETECTED NOT DETECTED Final   Candida tropicalis NOT DETECTED NOT DETECTED Final    Comment: Performed at  [facility], [place] [address], [place], [place] [address]  Culture, blood (Routine X 2) w Reflex to ID Panel     Status: None (Preliminary result)   Collection Time: 09/05/16 10:47 AM  Result Value Ref Range Status   Specimen Description BLOOD RIGHT HAND  Final   Special Requests   Final    BOTTLES DRAWN AEROBIC AND ANAEROBIC Blood Culture adequate volume   Culture  Setup Time   Final    GRAM NEGATIVE RODS Performed at [facility] Gram Stain Report Called to,Read Back By and Verified With: RUSSELL,L AT 2230 ON 4.9.2018 BY ISLEY,B IN BOTH AEROBIC AND ANAEROBIC BOTTLES CRITICAL VALUE NOTED.  VALUE IS CONSISTENT WITH PREVIOUSLY REPORTED AND CALLED VALUE. Performed at [facility], [place] [address], [place], [place] [address]    Culture NO GROWTH < 24 HOURS  Final   Report Status PENDING  Incomplete  MRSA PCR Screening     Status: None   Collection Time: 09/06/16  4:00 PM  Result Value Ref Range Status   MRSA by  PCR NEGATIVE NEGATIVE Final    Comment:        The GeneXpert MRSA Assay (FDA approved for NASAL specimens only), is one component of a comprehensive MRSA colonization surveillance program. It is not intended to diagnose MRSA infection nor to guide or monitor treatment for MRSA infections.    Studies/Results: US Renal  Result Date: 09/05/2016 CLINICAL DATA:  Hematuria.  Acute kidney injury. EXAM: RENAL / URINARY TRACT ULTRASOUND COMPLETE COMPARISON:  Abdominal ultrasound 05/14/2014 FINDINGS: Right Kidney: Length: 9.6 cm. Echogenicity within normal limits. No hydronephrosis. 3.0 x 2.6 x 3.0 cm interpolar cyst. Left Kidney: Length: 9.4 cm. Echogenicity within normal limits. No hydronephrosis. 2.1 x 1.7 x 1.3 cm interpolar cyst. Both renal cysts are mildly larger than on the 2015 examination, although some of the apparent change likely reflects differences in measurement technique. Bladder: Nondistended. IMPRESSION: 1. No hydronephrosis. 2. Bilateral renal cysts.  Electronically Signed   By: Logan Bores M.D.   On: 09/05/2016 09:27   Dg Chest Port 1 View  Result Date: 09/05/2016 CLINICAL DATA:  Fever, dehydration, acute kidney injury EXAM: PORTABLE CHEST 1 VIEW COMPARISON:  07/21/2016 FINDINGS: Cardiomediastinal silhouette is stable. Single lead cardiac pacemaker is unchanged in position. Status post median sternotomy. No infiltrate or pleural effusion. No pulmonary edema. IMPRESSION: No active disease. Electronically Signed   By: Lahoma Crocker M.D.   On: 09/05/2016 11:28    Medications:  Prior to Admission:  Prescriptions Prior to Admission  Medication Sig Dispense Refill Last Dose  . acetaminophen (TYLENOL) 325 MG tablet Take 2 tablets (650 mg total) by mouth every 4 (four) hours as needed for mild pain (or temp > 37.5 C (99.5 F)).   unknown  . aspirin 325 MG tablet Take 1 tablet (325 mg total) by mouth daily.   unknown  . atorvastatin (LIPITOR) 80 MG tablet Take 80 mg by mouth daily.    09/04/2016 at Unknown time  . carvedilol (COREG) 25 MG tablet Take 12.5 mg by mouth 2 (two) times daily with a meal.    09/04/2016 at 1700  . Cholecalciferol (VITAMIN D3) 2000 UNITS TABS Take 2,000 mg by mouth daily.   09/04/2016 at Unknown time  . digoxin (LANOXIN) 0.125 MG tablet TAKE 1/2 TABLET BY MOUTH DAILY 15 tablet 3 09/04/2016 at Unknown time  . feeding supplement, ENSURE ENLIVE, (ENSURE ENLIVE) LIQD Take 237 mLs by mouth 2 (two) times daily between meals. 237 mL 12 09/04/2016 at Unknown time  . ferrous sulfate 325 (65 FE) MG tablet Take 325 mg by mouth daily with breakfast.   09/04/2016 at Unknown time  . furosemide (LASIX) 40 MG tablet Take 40 mg by mouth daily.   09/04/2016 at Unknown time  . isosorbide dinitrate (ISORDIL) 20 MG tablet Take 40 mg by mouth 3 (three) times daily.    09/04/2016 at Unknown time  . mirabegron ER (MYRBETRIQ) 25 MG TB24 tablet Take 25 mg by mouth daily.   09/04/2016 at Unknown time  . mirtazapine (REMERON) 7.5 MG tablet Take 1 tablet by mouth daily.    09/04/2016 at Unknown time  . Multiple Vitamin (MULTIVITAMIN WITH MINERALS) TABS tablet Take 1 tablet by mouth daily.   09/04/2016 at Unknown time  . pantoprazole (PROTONIX) 40 MG tablet TAKE ONE TABLET   BY MOUTH   DAILY 30 tablet 11 09/04/2016 at Unknown time   Scheduled: . atorvastatin  80 mg Oral Daily  . carvedilol  3.125 mg Oral BID WC  . cefTRIAXone (ROCEPHIN)  IV  2 g Intravenous Q24H  . dolutegravir  50 mg Oral Daily  . emtricitabine-tenofovir AF  1 tablet Oral Daily  . insulin aspart  0-9 Units Subcutaneous TID WC  . isosorbide dinitrate  10 mg Oral TID  . mirabegron ER  25 mg Oral Daily  . mirtazapine  7.5 mg Oral Daily  . pantoprazole  40 mg Oral Daily   Continuous: . dextrose 5 % and 0.9% NaCl 100 mL/hr at 09/06/16 1551   VUD:[id number] **OR** acetaminophen, ondansetron **OR** ondansetron (ZOFRAN) IV  Assesment: He was admitted with acute on chronic renal failure. He has multiple other medical problems. He now has atrial fib with rapid ventricular response. He has protein calorie malnutrition which is severe. He has HIV disease which is a new diagnosis. He has HIV/AIDS wasting. He is hypotensive. He has not responded to treatment for his acute kidney injury. Family is considering hospice. Active Problems:   CKD (chronic kidney disease)   Diabetes mellitus type 2 in nonobese [facility])   Essential hypertension, benign   Hyperkalemia   CAD (coronary artery disease)   History of stroke   Anemia of chronic kidney failure   Severe malnutrition (HCC)   AKI (acute kidney injury) ([place])   Palliative care encounter   Goals of care, counseling/discussion   DNR (do not resuscitate) discussion   Protein-calorie malnutrition, severe   HIV disease ([place])    Plan: Continue fluid bolus. I'm concerned about trying to give him medications to slow his rate because of his low blood pressure. He is now on anti-retroviral treatment.    LOS: 2 days   Mathew Storck L 09/07/2016, 8:39 AM

## 2016-09-07 NOTE — Progress Notes (Signed)
Daily Progress Note   Patient Name: Keith Young       Date: 09/07/2016 DOB: 11/10/1950  Age: 66 y.o. MRN#: [id number] Attending Physician: Sinda Du, MD Primary Care Physician: [name], MD Admit Date: 09/04/2016  Reason for Consultation/Follow-up: Establishing goals of care, Hospice Evaluation and Psychosocial/spiritual support  Subjective: Keith Young is sitting quietly in bed. He is able to make and keep eye contact and make his basic needs known. He asks for some orange juice. I do not discuss his new diagnosis with him. We are planning a family meeting this afternoon at 1 PM. Call to attending, Dr. Luan Pulling to discuss plan of care, information to be shared with family.  I returned to the room later in the day, brother Jeneen Rinks and sister Azucena Kuba are and bedside. Mr. Hoban does not know the month, or year, but he knows we are in the hospital. Baron Hamper and I go to my office for a family meeting.  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 This is a great shock to his family. They have many questions including why was he not diagnosed earlier. We talk at length about HIV testing, who is tested, and when. Azucena Kuba shares her concern over her own health (she provided personal care for Keith Young), I reassure her, but encourage her to be tested.  We talk about infectious disease doctor and his recommendations, including the possibility of transferring to [facility] for more aggressive treatment. I share the side effects that are likely from this more aggressive therapy. At this point, brother Jeneen Rinks states his preference is for Mr. Pittsley to remain here at [facility].  He states that he does not want to put his brother through treatments that will be uncomfortable, and  not change his HIV status. He goes further to talk about how Mr. Meroney is not eating at this point. Brother Jeneen Rinks does seem encouraged that his brother is able to communicate effectively, looks better today.  I share the modern miracle of antibiotics and IV fluids. Azucena Kuba states, "that's today, it could look different tomorrow".  We also talk about Keith Young ongoing other health concerns including bedbound status, bacteremia, kidney disease. Azucena Kuba again today asks why this was not found earlier, and should he be on a kidney machine.  I share that this is an acute injury, and he, even at this point, does not need hemodialysis. We talk about his increased heart rate and low blood pressure. Family is, at this point, desiring to treat him in place. We talk about vasopressors for low blood pressure, and transferring to the intensive care. Family does not agree to escalate care at this time.  Mr. Parcel family is very shocked by the new diagnosis of HIV/AIDS wasting. They share that another sister is coming to see him today (most family is from [place]).  I encourage them to have family visits from all who want to see him (he has adult children in [place]).   I ask if family wants to know prognosis, Genny shakes her head no. Later in the conversation I share that we believe time to be short. I also share the recommendations for comfort care, hospice care. We talk about returning to [facility] with the benefits of hospice Azucena Kuba states she prefers for [name] to remain at [facility]), we also talk about hospice home of [facility]. Jeneen Rinks states that they have had family there with cancer, but this is difficult for him to see, because his brother is able to make eye contact and communicate. Azucena Kuba talks about sharing information with her family, and talking again tomorrow. I share with family that there could be a sudden sharp decline at any time. They understand and agree.   Length of Stay: 2  Current  Medications: Scheduled Meds:  . atorvastatin  80 mg Oral Daily  . carvedilol  3.125 mg Oral BID WC  . cefTRIAXone (ROCEPHIN)  IV  2 g Intravenous Q24H  . dolutegravir  50 mg Oral Daily  . emtricitabine-tenofovir AF  1 tablet Oral Daily  . insulin aspart  0-9 Units Subcutaneous TID WC  . isosorbide dinitrate  10 mg Oral TID  . mirabegron ER  25 mg Oral Daily  . mirtazapine  7.5 mg Oral Daily  . pantoprazole  40 mg Oral Daily    Continuous Infusions: . dextrose 5 % and 0.9% NaCl 100 mL/hr at 09/06/16 1551    PRN Meds: acetaminophen **OR** acetaminophen, ondansetron **OR** ondansetron (ZOFRAN) IV  Physical Exam  Constitutional: No distress.  Frail and thin, makes and keeps eye contact, able to communicate basic needs  HENT:  Head: Normocephalic and atraumatic.  Severe temporal wasting  Cardiovascular: Normal rate and regular rhythm.   Pulmonary/Chest: Effort normal. No respiratory distress.  Abdominal: Soft. He exhibits no distension.  Musculoskeletal: He exhibits no edema.  Severe muscle wasting  Neurological: He is alert.  Oriented to person  Skin: Skin is warm and dry.  Nursing note and vitals reviewed.           Vital Signs: BP (!) 83/62 (BP Location: Right Arm)   Pulse 84   Temp 98.3 F (36.8 C) (Oral)   Resp 12   Ht 5\' 7"  (1.702 m)   Wt 47.1 kg (103 lb 12.8 oz)   SpO2 100%   BMI 16.26 kg/m  SpO2: SpO2: 100 % O2 Device: O2 Device: Not Delivered O2 Flow Rate: O2 Flow Rate (L/min): 3 L/min  Intake/output summary:  Intake/Output Summary (Last 24 hours) at 09/07/16 1129 Last data filed at 09/07/16 0345  Gross per 24 hour  Intake          2346.67 ml  Output              250 ml  Net  2096.67 ml   LBM: Last BM Date: 09/04/16 Baseline Weight: Weight: 49.9 kg (110 lb) Most recent weight: Weight: 47.1 kg (103 lb 12.8 oz)       Palliative Assessment/Data:    Flowsheet Rows     Most Recent Value  Intake Tab  Referral Department  Pulmonary  Unit  at Time of Referral  Med/Surg Unit  Palliative Care Primary Diagnosis  Cardiac  Date Notified  09/05/16  Palliative Care Type  New Palliative care  Reason for referral  Clarify Goals of Care  Date of Admission  09/04/16  Date first seen by Palliative Care  09/05/16  # of days Palliative referral response time  0 Day(s)  # of days IP prior to Palliative referral  1  Clinical Assessment  Palliative Performance Scale Score  20%  Pain Max last 24 hours  Not able to report  Pain Min Last 24 hours  Not able to report  Dyspnea Max Last 24 Hours  Not able to report  Dyspnea Min Last 24 hours  Not able to report  Psychosocial & Spiritual Assessment  Palliative Care Outcomes  Patient/Family meeting held?  Yes  Who was at the meeting?  with brother in law, Jeronimo Greaves via phone, face to face sched for 4/10 at 1300  Palliative Care Outcomes  Provided psychosocial or spiritual support, Clarified goals of care  Patient/Family wishes: Interventions discontinued/not started   Mechanical Ventilation  Palliative Care follow-up planned  -- [Follow-up while at [facility]]      Patient Active Problem List   Diagnosis Date Noted  . Protein-calorie malnutrition, severe 09/06/2016  . HIV disease ([place]) 09/06/2016  . Palliative care encounter   . Goals of care, counseling/discussion   . DNR (do not resuscitate) discussion   . AKI (acute kidney injury) ([place]) 09/04/2016  . Malnutrition of moderate degree 06/28/2016  . Altered mental status 06/26/2016  . CVA (cerebral vascular accident) ([place]) 06/26/2016  . Severe malnutrition ([place]) 06/26/2016  . HCAP (healthcare-associated pneumonia) 06/04/2016  . Mucosal abnormality of stomach   . Gastritis and gastroduodenitis 09/30/2015  . Heme positive stool 09/30/2015  . Pancytopenia ([employer]) 09/30/2015  . Chest pain 04/28/2015  . Elevated troponin 04/28/2015  . ICD (implantable cardioverter-defibrillator) in place 01/22/2015  . Anemia 09/30/2014  . Gastric polyp   .  Reflux esophagitis   . Elevated LFTs 03/26/2014  . Anemia in chronic kidney disease 03/26/2014  . Carotid bruit 12/18/2013  . Acute on chronic systolic heart failure ([place]) 10/24/2013  . CHF (congestive heart failure), NYHA class III ([place]) 10/23/2013  . Cough 10/23/2013  . CKD (chronic kidney disease) 10/23/2013  . Diabetes mellitus type 2 in nonobese ([place]) 10/23/2013  . Essential hypertension, benign 10/23/2013  . Hyperkalemia 10/23/2013  . CAD (coronary artery disease) 10/23/2013  . History of stroke 10/23/2013  . Anemia of chronic kidney failure 10/23/2013  . Other and unspecified hyperlipidemia 10/23/2013    Palliative Care Assessment & Plan   Patient Profile: 66 y.o.malewith past medical history of Stroke with right-sided weakness, PNCsince January 2018 (left a group home of 2 years due to being non-ambulatory), high blood pressure and high cholesterol, COPD, stage III chronic kidney disease with anemia of chronic disease with Aranespinjections Q2 weeks, chronic systolic CHF class III, alcoholism in remission,, status post AICDadmitted on 4/8/2018with Acute kidney injury on chronic kidney disease stage III.   Assessment: Acute kidney injury; no great improvement in kidney function, continue testing. Family is considering focusing on comfort and  dignity with hospice in place. New diagnosis of HIV/AIDS wasting; per infectious disease, ARV started. Waiting on family for direction of care.  Recommendations/Plan:  at this point, continue to treat the treatable but no extraordinary measures such as CPR or intubation. We discussed the possibility of transfer to ICU with vasopressors if needed. Family is unable to make decisions at this time, instead stating that we should continue doing what were doing.  Goals of Care and Additional Recommendations:  Limitations on Scope of Treatment: No CPR or intubation.  Code Status:    Code Status Orders        Start     Ordered    09/04/16 2301  Do not attempt resuscitation (DNR)  Continuous    Question Answer Comment  In the event of cardiac or respiratory ARREST Do not call a "code blue"   In the event of cardiac or respiratory ARREST Do not perform Intubation, CPR, defibrillation or ACLS   In the event of cardiac or respiratory ARREST Use medication by any route, position, wound care, and other measures to relive pain and suffering. May use oxygen, suction and manual treatment of airway obstruction as needed for comfort.      09/04/16 2301    Code Status History    Date Active Date Inactive Code Status Order ID Comments User Context   06/26/2016 10:13 PM 06/30/2016  1:52 PM Full Code [id number]  Karmen Bongo, MD Inpatient   06/04/2016  2:43 AM 06/08/2016  2:50 PM DNR [id number]  Orvan Falconer, MD Inpatient   04/28/2015 11:29 PM 04/29/2015  4:17 PM Full Code [id number]  Oswald Hillock, MD Inpatient   02/06/2014 10:43 AM 02/06/2014  4:29 PM Full Code [id number]  Larey Dresser, MD Inpatient   10/23/2013  7:33 PM 10/28/2013  3:01 PM Full Code [id number]  Louellen Molder, MD Inpatient    Advance Directive Documentation     Most Recent Value  Type of Advance Directive  Out of facility DNR (pink MOST or yellow form)  Pre-existing out of facility DNR order (yellow form or pink MOST form)  Physician notified to receive inpatient order  "MOST" Form in Place?  -       Prognosis:   < 3 months, or less, would not be surprising based on 3 hospitalizations in the last 4 months, frailty, malnutrition, bedbound state, worsened by the new diagnosis of HIV/AIDS wasting.  Discharge Planning:  At this point, family is requesting that Mr. Arndt remain at [facility].  We discuss hospice home, family is considering.  Care plan was discussed with Nursing staff, case manager, social worker, and Dr. Luan Pulling.  Thank you for allowing the Palliative Medicine Team to assist in the care of this patient.   Time In: 1320 Time Out: 1420 Total Time 60 minutes  Prolonged Time Billed  yes       Greater than 50%  of this time was spent counseling and coordinating care related to the above assessment and plan.  Drue Novel, NP  Please contact Palliative Medicine Team phone at [phone] for questions and concerns.

## 2016-09-07 NOTE — Care Management Important Message (Signed)
Important Message  Patient Details  Name: Keith Young MRN: [id number] Date of Birth: Mar 26, 1951   Medicare Important Message Given:  Yes    Ori Kreiter, Chauncey Reading, RN 09/07/2016, 9:13 AM

## 2016-09-07 NOTE — Progress Notes (Signed)
Pt's HR changed from sinus rhythm to Afib this morning aorund 6 AM with HR increased in the 140s and sustaining. Temp mildly elevated at 99.5 and reduced to 98.7 with dose of Tylenol. BP intially 126/66 at 0620. BP recheck at 0700 was 71/53. Paged Dr. Luan Pulling and received order for 500 cc bolus and stat EKG. EKG interpreted rhythm as Afib RVR 144. Second IV started in L forearm by Lawernce Keas, RN.  Report given to Vista Deck, RN who will continue caring for pt today.

## 2016-09-07 NOTE — Progress Notes (Signed)
     INFECTIOUS DISEASE ATTENDING ADDENDUM:   Date: 09/07/2016  Patient name: Keith Young  Medical record number: [id number]  Date of birth: Jun 12, 1950    Case discussed with Dr. Luan Pulling. Patient likely with HIV/AIDS wasting. If that was the ONLY problem I would feel fairly confident we could turn patients trajectory around but he has mx other comorbid problems that are not going well at all and pure palliative approach being considered.  I have started ARV with Tivicay and Descovy which will drop his viral load rapidly  If he decision is made for aggressive care then I would also add PCP prophlaxis with Bactrim TIW and transfer him to [facility]  Dr. Luan Pulling will let me know if this ends up being the case but anticipate transition to comfort care. His spouse, sexual partners will need to be made aware of the diagnosis and be tested and if + linked to care. Any PCP can do this, vs Health Dept. Also [place] provides free testing.  Alcide Evener 09/07/2016, 8:27 AM

## 2016-09-08 LAB — GLUCOSE, CAPILLARY
GLUCOSE-CAPILLARY: 112 mg/dL — AB (ref 65–99)
GLUCOSE-CAPILLARY: 121 mg/dL — AB (ref 65–99)
Glucose-Capillary: 94 mg/dL (ref 65–99)
Glucose-Capillary: 162 mg/dL — ABNORMAL HIGH (ref 65–99)

## 2016-09-08 LAB — BASIC METABOLIC PANEL
ANION GAP: 8 (ref 5–15)
BUN: 128 mg/dL — ABNORMAL HIGH (ref 6–20)
CHLORIDE: 117 mmol/L — AB (ref 101–111)
CO2: 17 mmol/L — AB (ref 22–32)
Calcium: 7.6 mg/dL — ABNORMAL LOW (ref 8.9–10.3)
Creatinine, Ser: 4.16 mg/dL — ABNORMAL HIGH (ref 0.61–1.24)
GFR calc non Af Amer: 14 mL/min — ABNORMAL LOW (ref >60)
GFR, EST AFRICAN AMERICAN: 16 mL/min — AB (ref >60)
GLUCOSE: 113 mg/dL — AB (ref 65–99)
POTASSIUM: 4.2 mmol/L (ref 3.5–5.1)
Sodium: 142 mmol/L (ref 135–145)

## 2016-09-08 LAB — CULTURE, BLOOD (ROUTINE X 2)
SPECIAL REQUESTS: ADEQUATE
Special Requests: ADEQUATE

## 2016-09-08 LAB — CD4/CD8 (T-HELPER/T-SUPPRESSOR CELL)
CD4 absolute: 40 /uL — ABNORMAL LOW (ref 500–1900)
CD4%: 14 % — AB (ref 30.0–60.0)
CD8 T Cell Abs: 80 /uL — ABNORMAL LOW (ref 230–1000)
CD8tox: 30 % (ref 15.0–40.0)
Ratio: 0.375 — ABNORMAL LOW (ref 1.0–3.0)
TOTAL LYMPHOCYTE COUNT: 270 /uL — AB (ref 1000–4000)

## 2016-09-08 LAB — RPR: RPR: NONREACTIVE

## 2016-09-08 LAB — HEPATITIS B SURFACE ANTIGEN: Hepatitis B Surface Ag: NEGATIVE

## 2016-09-08 NOTE — Progress Notes (Signed)
Subjective: Interval History: has no complaint of difficulty in breathing. His appetite is poor but denies any nausea or vomiting..  Objective: Vital signs in last 24 hours: Temp:  [98 F (36.7 C)-98.9 F (37.2 C)] 98.5 F (36.9 C) (04/12 0607) Pulse Rate:  [85-125] 90 (04/12 0607) Resp:  [15-18] 18 (04/12 0607) BP: (91-140)/(64-103) 109/65 (04/12 0607) SpO2:  [100 %] 100 % (04/12 0607) Weight change:   Intake/Output from previous day: 04/11 0701 - 04/12 0700 In: 1395 [P.O.:480; I.V.:865; IV Piggyback:50] Out: 1575 [ZJIRC:[id number]] Intake/Output this shift: Total I/O In: 381 [P.O.:381] Out: -   General appearance: alert, cooperative, cachectic and no distress Resp: clear to auscultation bilaterally Cardio: regular rate and rhythm Extremities: No edema  Lab Results:  Recent Labs  09/07/16 0620  HGB 11.4*  HCT 35.0*   BMET:  Recent Labs  09/07/16 0620 09/08/16 0502  NA 141 142  K 4.6 4.2  CL 115* 117*  CO2 16* 17*  GLUCOSE 162* 113*  BUN 140* 128*  CREATININE 4.74* 4.16*  CALCIUM 7.8* 7.6*   No results for input(s): PTH in the last 72 hours. Iron Studies:  Recent Labs  09/07/16 0620  IRON 38*  TIBC 147*  FERRITIN 402*    Studies/Results: No results found.  I have reviewed the patient's current medications.  Assessment/Plan: Problem #1 acute kidney injury superimposed on chronic. Presently patient is nonoliguric. His BUN and creatinine is improving. Etiology was thought to be possibly secondary to ATN/prerenal syndrome. Problem #2 chronic renal failure: Stage III. Possibly a combination of HIV/hypertension/diabetes/recurrent AK/ischemic. Problem #3 sepsis: Blood culture is positive for Escherichia coli and Enterobacter species. Presently is on antibiotics. Patient is afebrile and his white blood cell count is normal. Problem #4 history of nonischemic cardiomyopathy: Status post AICD placement. Patient had 1500 mL of urine output and denies any difficulty  breathing. Problem #5 anemia: His hemoglobin is within our target goal. Problem #6 hypertension: Patient was hypotensive most likely from infection Problem #7 history of HIV   Plan: Agree with hydration 2] increase IV fluid to 125 mL per hour 3] we'll check his renal panel and CBC in the morning.   LOS: 3 days   Jillian Warth S 09/08/2016,9:19 AM

## 2016-09-08 NOTE — Progress Notes (Signed)
Patient having 6-10 beats of VT throughout day.  VSS.  Not experiencing any symptoms. MD paged.

## 2016-09-08 NOTE — Progress Notes (Signed)
Daily Progress Note   Patient Name: Keith Young       Date: 09/08/2016 DOB: 04-Mar-1951  Age: 66 y.o. MRN#: [id number] Attending Physician: Sinda Du, MD Primary Care Physician: [name], MD Admit Date: 09/04/2016  Reason for Consultation/Follow-up: Establishing goals of care, Hospice Evaluation and Psychosocial/spiritual support  Subjective: Bedside his brother Jeneen Rinks. No other family present. At this point, brother Jeneen Rinks states he wants to continue to treat Keith Young here at [facility].  I share with Jeneen Rinks that Keith Young knows his diagnosis, this was shared by his attending, Dr. Luan Pulling. I share also that Keith Young has forgotten this information, and it may be kinder to not burden him with repeating the diagnosis. Jeneen Rinks agrees.  We talk about disposition. Jeneen Rinks states that he was satisfied with the care that Keith Young was getting at [facility], and he is okay with his brother returning there.  Length of Stay: 3  Current Medications: Scheduled Meds:  . atorvastatin  80 mg Oral Daily  . carvedilol  3.125 mg Oral BID WC  . cefTRIAXone (ROCEPHIN)  IV  2 g Intravenous Q24H  . dolutegravir  50 mg Oral Daily  . emtricitabine-tenofovir AF  1 tablet Oral Daily  . insulin aspart  0-9 Units Subcutaneous TID WC  . isosorbide dinitrate  10 mg Oral TID  . mirabegron ER  25 mg Oral Daily  . mirtazapine  7.5 mg Oral Daily  . pantoprazole  40 mg Oral Daily    Continuous Infusions: . dextrose 5 % and 0.45% NaCl 1,000 mL with sodium bicarbonate 75 mEq infusion 125 mL/hr at 09/08/16 1354    PRN Meds: acetaminophen **OR** acetaminophen, ondansetron **OR** ondansetron (ZOFRAN) IV  Physical Exam  Constitutional: No distress.  Weak and frail, chronically ill appearing, temporal wasting  HENT:  Head:  Normocephalic and atraumatic.  Severe temporal wasting  Cardiovascular: Normal rate and regular rhythm.   Pulmonary/Chest: Effort normal. No respiratory distress.  Abdominal: Soft. He exhibits no distension.  Musculoskeletal: He exhibits no edema.  Severe muscle wasting  Neurological: He is alert.  Oriented person only  Skin: Skin is warm and dry.  Nursing note and vitals reviewed.           Vital Signs: BP 109/65 (BP Location: Right Arm)   Pulse 90   Temp 98.5 F (36.9 C) (  Oral)   Resp 18   Ht 5\' 7"  (1.702 m)   Wt 47.1 kg (103 lb 12.8 oz)   SpO2 100%   BMI 16.26 kg/m  SpO2: SpO2: 100 % O2 Device: O2 Device: Nasal Cannula O2 Flow Rate: O2 Flow Rate (L/min): 3 L/min  Intake/output summary:  Intake/Output Summary (Last 24 hours) at 09/08/16 1448 Last data filed at 09/08/16 1200  Gross per 24 hour  Intake          2674.08 ml  Output             1975 ml  Net           699.08 ml   LBM: Last BM Date: 09/08/16 Baseline Weight: Weight: 49.9 kg (110 lb) Most recent weight: Weight: 47.1 kg (103 lb 12.8 oz)       Palliative Assessment/Data:    Flowsheet Rows     Most Recent Value  Intake Tab  Referral Department  Pulmonary  Unit at Time of Referral  Med/Surg Unit  Palliative Care Primary Diagnosis  Cardiac  Date Notified  09/05/16  Palliative Care Type  New Palliative care  Reason for referral  Clarify Goals of Care  Date of Admission  09/04/16  Date first seen by Palliative Care  09/05/16  # of days Palliative referral response time  0 Day(s)  # of days IP prior to Palliative referral  1  Clinical Assessment  Palliative Performance Scale Score  20%  Pain Max last 24 hours  Not able to report  Pain Min Last 24 hours  Not able to report  Dyspnea Max Last 24 Hours  Not able to report  Dyspnea Min Last 24 hours  Not able to report  Psychosocial & Spiritual Assessment  Palliative Care Outcomes  Patient/Family meeting held?  Yes  Who was at the meeting?  with brother  in law, Jeronimo Greaves via phone, face to face sched for 4/10 at 1300  Palliative Care Outcomes  Provided psychosocial or spiritual support, Clarified goals of care  Patient/Family wishes: Interventions discontinued/not started   Mechanical Ventilation  Palliative Care follow-up planned  -- [Follow-up while at [facility]]      Patient Active Problem List   Diagnosis Date Noted  . Encounter for hospice care discussion   . Protein-calorie malnutrition, severe 09/06/2016  . HIV disease ([place]) 09/06/2016  . Palliative care encounter   . Goals of care, counseling/discussion   . DNR (do not resuscitate) discussion   . AKI (acute kidney injury) ([place]) 09/04/2016  . Malnutrition of moderate degree 06/28/2016  . Altered mental status 06/26/2016  . CVA (cerebral vascular accident) ([place]) 06/26/2016  . Severe malnutrition ([place]) 06/26/2016  . HCAP (healthcare-associated pneumonia) 06/04/2016  . Mucosal abnormality of stomach   . Gastritis and gastroduodenitis 09/30/2015  . Heme positive stool 09/30/2015  . Pancytopenia ([place]) 09/30/2015  . Chest pain 04/28/2015  . Elevated troponin 04/28/2015  . ICD (implantable cardioverter-defibrillator) in place 01/22/2015  . Anemia 09/30/2014  . Gastric polyp   . Reflux esophagitis   . Elevated LFTs 03/26/2014  . Anemia in chronic kidney disease 03/26/2014  . Carotid bruit 12/18/2013  . Acute on chronic systolic heart failure ([place]) 10/24/2013  . CHF (congestive heart failure), NYHA class III ([place]) 10/23/2013  . Cough 10/23/2013  . CKD (chronic kidney disease) 10/23/2013  . Diabetes mellitus type 2 in nonobese ([place]) 10/23/2013  . Essential hypertension, benign 10/23/2013  . Hyperkalemia 10/23/2013  . CAD (coronary artery disease) 10/23/2013  .  History of stroke 10/23/2013  . Anemia of chronic kidney failure 10/23/2013  . Other and unspecified hyperlipidemia 10/23/2013    Palliative Care Assessment & Plan   Patient Profile: 66 y.o.malewith past medical  history of Stroke with right-sided weakness, PNCsince January 2018 (left a group home of 2 years due to being non-ambulatory), high blood pressure and high cholesterol, COPD, stage III chronic kidney disease with anemia of chronic disease with Aranespinjections Q2 weeks, chronic systolic CHF class III, alcoholism in remission,, status post AICDadmitted on 4/8/2018with Acute kidney injury on chronic kidney disease stage III.   Assessment: Acute kidney injury; no great improvement in kidney function, continue testing. Family is considering focusing on comfort and dignity with hospice in place. New diagnosis of HIV/AIDS wasting; per infectious disease, ARV started. Waiting on family for direction of care.  Recommendations/Plan:  at this point, continue to treat the treatable but no extraordinary measures such as CPR or intubation. We discussed the possibility of transfer to ICU with vasopressors if needed. Family is unable to make decisions at this time, instead stating that we should continue doing what were doing.  Goals of Care and Additional Recommendations:  Limitations on Scope of Treatment:  continue to treat the treatable, but no CPR or intubation.   Code Status:    Code Status Orders        Start     Ordered   09/04/16 2301  Do not attempt resuscitation (DNR)  Continuous    Question Answer Comment  In the event of cardiac or respiratory ARREST Do not call a "code blue"   In the event of cardiac or respiratory ARREST Do not perform Intubation, CPR, defibrillation or ACLS   In the event of cardiac or respiratory ARREST Use medication by any route, position, wound care, and other measures to relive pain and suffering. May use oxygen, suction and manual treatment of airway obstruction as needed for comfort.      09/04/16 2301    Code Status History    Date Active Date Inactive Code Status Order ID Comments User Context   06/26/2016 10:13 PM 06/30/2016  1:52 PM Full Code [id number]   Karmen Bongo, MD Inpatient   06/04/2016  2:43 AM 06/08/2016  2:50 PM DNR [id number]  Orvan Falconer, MD Inpatient   04/28/2015 11:29 PM 04/29/2015  4:17 PM Full Code [id number]  Oswald Hillock, MD Inpatient   02/06/2014 10:43 AM 02/06/2014  4:29 PM Full Code [id number]  Larey Dresser, MD Inpatient   10/23/2013  7:33 PM 10/28/2013  3:01 PM Full Code [id number]  Louellen Molder, MD Inpatient    Advance Directive Documentation     Most Recent Value  Type of Advance Directive  Out of facility DNR (pink MOST or yellow form)  Pre-existing out of facility DNR order (yellow form or pink MOST form)  Physician notified to receive inpatient order  "MOST" Form in Place?  -       Prognosis:   < 3 months , or less, would not be surprising based on 3 hospitalizations in the last 4 months, frailty, malnutrition, bedbound state, worsened by the new diagnosis of HIV/AIDS wasting.  Discharge Planning:  to be determined, brother Jeneen Rinks is agreeable to return to [place] was discussed with nursing staff, CM, SW and Dr. Luan Pulling.   Thank you for allowing the Palliative Medicine Team to assist in the care of this patient.   Time In: 1140 Time Out: 1220 Total Time 40 minutes  Prolonged Time Billed  no       Greater than 50%  of this time was spent counseling and coordinating care related to the above assessment and plan.  Drue Novel, NP  Please contact Palliative Medicine Team phone at [phone] for questions and concerns.

## 2016-09-08 NOTE — Progress Notes (Signed)
Subjective: He's overall about the same. He had an episode of atrial fibrillation rapid ventricular response yesterday. He was treated with IV fluids and improved. He has multiple medical problems now. Diagnosed about 36 hours ago with HIV/AIDS and AIDS wasting. He has acute on chronic renal failure which has not improved he has coronary disease, congestive heart failure, COPD, failure to thrive, recent stroke. He has no complaints. He is able to talk to me but I'm not sure that he really understands his situation.  Objective: Vital signs in last 24 hours: Temp:  [98 F (36.7 C)-98.9 F (37.2 C)] 98.5 F (36.9 C) (04/12 0607) Pulse Rate:  [84-125] 90 (04/12 0607) Resp:  [12-18] 18 (04/12 0607) BP: (83-140)/(62-103) 109/65 (04/12 0607) SpO2:  [100 %] 100 % (04/12 4196) Weight change:  Last BM Date: 09/07/16  Intake/Output from previous day: 04/11 0701 - 04/12 0700 In: 1395 [P.O.:480; I.V.:865; IV Piggyback:50] Out: 1575 [Urine:1575]  PHYSICAL EXAM General appearance: alert and moderate distress Resp: rhonchi bilaterally Cardio: regular rate and rhythm, S1, S2 normal, no murmur, click, rub or gallop GI: soft, non-tender; bowel sounds normal; no masses,  no organomegaly Extremities: extremities normal, atraumatic, no cyanosis or edema Skin warm and dry.  Lab Results:  Results for orders placed or performed during the hospital encounter of 09/04/16 (from the past 48 hour(s))  Glucose, capillary     Status: Abnormal   Collection Time: 09/06/16 11:25 AM  Result Value Ref Range   Glucose-Capillary 155 (H) 65 - 99 mg/dL   Comment 1 Notify RN    Comment 2 Document in Chart   MRSA PCR Screening     Status: None   Collection Time: 09/06/16  4:00 PM  Result Value Ref Range   MRSA by PCR NEGATIVE NEGATIVE    Comment:        The GeneXpert MRSA Assay (FDA approved for NASAL specimens only), is one component of a comprehensive MRSA colonization surveillance program. It is not intended  to diagnose MRSA infection nor to guide or monitor treatment for MRSA infections.   Glucose, capillary     Status: Abnormal   Collection Time: 09/06/16  4:28 PM  Result Value Ref Range   Glucose-Capillary 116 (H) 65 - 99 mg/dL   Comment 1 Notify RN    Comment 2 Document in Chart   Glucose, capillary     Status: Abnormal   Collection Time: 09/06/16  8:58 PM  Result Value Ref Range   Glucose-Capillary 106 (H) 65 - 99 mg/dL   Comment 1 Notify RN    Comment 2 Document in Chart   RPR     Status: None   Collection Time: 09/07/16  6:13 AM  Result Value Ref Range   RPR Ser Ql Non Reactive Non Reactive    Comment: (NOTE) Performed At: [facility] [place], [place] [id number] Lindon Romp MD JJ:[id number]   Hepatitis B surface antigen     Status: None   Collection Time: 09/07/16  6:19 AM  Result Value Ref Range   Hepatitis B Surface Ag Negative Negative    Comment: (NOTE) Performed At: [facility] [address] [place], [place] [id number] Lindon Romp MD FW:[id number]   Glucose, capillary     Status: Abnormal   Collection Time: 09/07/16  7:37 AM  Result Value Ref Range   Glucose-Capillary 169 (H) 65 - 99 mg/dL   Comment 1 Notify RN    Comment 2 Document in Chart  Glucose, capillary     Status: None   Collection Time: 09/07/16 11:31 AM  Result Value Ref Range   Glucose-Capillary 86 65 - 99 mg/dL   Comment 1 Notify RN    Comment 2 Document in Chart   Glucose, capillary     Status: None   Collection Time: 09/07/16  4:34 PM  Result Value Ref Range   Glucose-Capillary 99 65 - 99 mg/dL   Comment 1 Notify RN    Comment 2 Document in Chart   Glucose, capillary     Status: Abnormal   Collection Time: 09/07/16  9:37 PM  Result Value Ref Range   Glucose-Capillary 131 (H) 65 - 99 mg/dL   Comment 1 Notify RN    Comment 2 Document in Chart   Basic metabolic panel     Status: Abnormal   Collection Time: 09/08/16  5:02 AM  Result Value Ref  Range   Sodium 142 135 - 145 mmol/L   Potassium 4.2 3.5 - 5.1 mmol/L   Chloride 117 (H) 101 - 111 mmol/L   CO2 17 (L) 22 - 32 mmol/L   Glucose, Bld 113 (H) 65 - 99 mg/dL   BUN 128 (H) 6 - 20 mg/dL    Comment: RESULTS CONFIRMED BY MANUAL DILUTION   Creatinine, Ser 4.16 (H) 0.61 - 1.24 mg/dL   Calcium 7.6 (L) 8.9 - 10.3 mg/dL   GFR calc non Af Amer 14 (L) >60 mL/min   GFR calc Af Amer 16 (L) >60 mL/min    Comment: (NOTE) The eGFR has been calculated using the CKD EPI equation. This calculation has not been validated in all clinical situations. eGFR's persistently <60 mL/min signify possible Chronic Kidney Disease.    Anion gap 8 5 - 15  Glucose, capillary     Status: None   Collection Time: 09/08/16  7:46 AM  Result Value Ref Range   Glucose-Capillary 94 65 - 99 mg/dL   Comment 1 Notify RN    Comment 2 Document in Chart     ABGS No results for input(s): PHART, PO2ART, TCO2, HCO3 in the last 72 hours.  Invalid input(s): PCO2 CULTURES Recent Results (from the past 240 hour(s))  Culture, Urine     Status: Abnormal   Collection Time: 09/04/16  9:23 PM  Result Value Ref Range Status   Specimen Description URINE, CLEAN CATCH  Final   Special Requests NONE  Final   Culture MULTIPLE SPECIES PRESENT, SUGGEST RECOLLECTION (A)  Final   Report Status 09/06/2016 FINAL  Final  Culture, blood (Routine X 2) w Reflex to ID Panel     Status: Abnormal (Preliminary result)   Collection Time: 09/05/16 10:43 AM  Result Value Ref Range Status   Specimen Description BLOOD RIGHT ARM  Final   Special Requests   Final    BOTTLES DRAWN AEROBIC AND ANAEROBIC Blood Culture adequate volume   Culture  Setup Time   Final    Performed at [facility] AT 2230 ON 4.9.2018 BY ISLEY,B IN BOTH AEROBIC AND ANAEROBIC BOTTLES Organism ID to follow CRITICAL RESULT CALLED TO, READ BACK BY AND VERIFIED WITH: LISA RUSSELL,RN '@[id number]'  09/06/16 MKELLY,MLT Performed at [facility], [place] [address] [place], [place] [address]    Culture ESCHERICHIA COLI (A)  Final   Report Status PENDING  Incomplete  Blood Culture ID Panel (Reflexed)     Status: Abnormal   Collection Time: 09/05/16 10:43 AM  Result Value Ref  Range Status   Enterococcus species NOT DETECTED NOT DETECTED Final   Listeria monocytogenes NOT DETECTED NOT DETECTED Final   Staphylococcus species NOT DETECTED NOT DETECTED Final   Staphylococcus aureus NOT DETECTED NOT DETECTED Final   Streptococcus species NOT DETECTED NOT DETECTED Final   Streptococcus agalactiae NOT DETECTED NOT DETECTED Final   Streptococcus pneumoniae NOT DETECTED NOT DETECTED Final   Streptococcus pyogenes NOT DETECTED NOT DETECTED Final   Acinetobacter baumannii NOT DETECTED NOT DETECTED Final   Enterobacteriaceae species DETECTED (A) NOT DETECTED Final    Comment: Enterobacteriaceae represent a large family of gram-negative bacteria, not a single organism. CRITICAL RESULT CALLED TO, READ BACK BY AND VERIFIED WITH: LISA RUSSELL,RN '@[id number]'  09/06/16 MKELLY,MLT    Enterobacter cloacae complex NOT DETECTED NOT DETECTED Final   Escherichia coli DETECTED (A) NOT DETECTED Final    Comment: CRITICAL RESULT CALLED TO, READ BACK BY AND VERIFIED WITH: LISA RUSSELL,RN '@[id number]'  09/06/16 MKELLY,MLT    Klebsiella oxytoca NOT DETECTED NOT DETECTED Final   Klebsiella pneumoniae NOT DETECTED NOT DETECTED Final   Proteus species NOT DETECTED NOT DETECTED Final   Serratia marcescens NOT DETECTED NOT DETECTED Final   Carbapenem resistance NOT DETECTED NOT DETECTED Final   Haemophilus influenzae NOT DETECTED NOT DETECTED Final   Neisseria meningitidis NOT DETECTED NOT DETECTED Final   Pseudomonas aeruginosa NOT DETECTED NOT DETECTED Final   Candida albicans NOT DETECTED NOT DETECTED Final   Candida glabrata NOT DETECTED NOT DETECTED Final   Candida krusei NOT DETECTED NOT DETECTED Final   Candida parapsilosis NOT DETECTED NOT DETECTED Final    Candida tropicalis NOT DETECTED NOT DETECTED Final    Comment: Performed at [facility], [place] [address] [place], [place] [address]  Culture, blood (Routine X 2) w Reflex to ID Panel     Status: Abnormal (Preliminary result)   Collection Time: 09/05/16 10:47 AM  Result Value Ref Range Status   Specimen Description BLOOD RIGHT HAND  Final   Special Requests   Final    BOTTLES DRAWN AEROBIC AND ANAEROBIC Blood Culture adequate volume   Culture  Setup Time   Final    GRAM NEGATIVE RODS Performed at [facility] Gram Stain Report Called to,Read Back By and Verified With: RUSSELL,L AT 2230 ON 4.9.2018 BY ISLEY,B IN BOTH AEROBIC AND ANAEROBIC BOTTLES CRITICAL VALUE NOTED.  VALUE IS CONSISTENT WITH PREVIOUSLY REPORTED AND CALLED VALUE. Performed at [facility], [place] [address] [place], [place] [address]    Culture ESCHERICHIA COLI (A)  Final   Report Status PENDING  Incomplete  MRSA PCR Screening     Status: None   Collection Time: 09/06/16  4:00 PM  Result Value Ref Range Status   MRSA by PCR NEGATIVE NEGATIVE Final    Comment:        The GeneXpert MRSA Assay (FDA approved for NASAL specimens only), is one component of a comprehensive MRSA colonization surveillance program. It is not intended to diagnose MRSA infection nor to guide or monitor treatment for MRSA infections.    Studies/Results: No results found.  Medications:  Prior to Admission:  Prescriptions Prior to Admission  Medication Sig Dispense Refill Last Dose  . acetaminophen (TYLENOL) 325 MG tablet Take 2 tablets (650 mg total) by mouth every 4 (four) hours as needed for mild pain (or temp > 37.5 C (99.5 F)).   unknown  . aspirin 325 MG tablet Take 1 tablet (325 mg total) by mouth daily.   unknown  .  atorvastatin (LIPITOR) 80 MG tablet Take 80 mg by mouth daily.    09/04/2016 at Unknown time  . carvedilol (COREG) 25 MG tablet Take 12.5 mg by mouth 2 (two) times daily with a meal.    09/04/2016 at 1700   . Cholecalciferol (VITAMIN D3) 2000 UNITS TABS Take 2,000 mg by mouth daily.   09/04/2016 at Unknown time  . digoxin (LANOXIN) 0.125 MG tablet TAKE 1/2 TABLET BY MOUTH DAILY 15 tablet 3 09/04/2016 at Unknown time  . feeding supplement, ENSURE ENLIVE, (ENSURE ENLIVE) LIQD Take 237 mLs by mouth 2 (two) times daily between meals. 237 mL 12 09/04/2016 at Unknown time  . ferrous sulfate 325 (65 FE) MG tablet Take 325 mg by mouth daily with breakfast.   09/04/2016 at Unknown time  . furosemide (LASIX) 40 MG tablet Take 40 mg by mouth daily.   09/04/2016 at Unknown time  . isosorbide dinitrate (ISORDIL) 20 MG tablet Take 40 mg by mouth 3 (three) times daily.    09/04/2016 at Unknown time  . mirabegron ER (MYRBETRIQ) 25 MG TB24 tablet Take 25 mg by mouth daily.   09/04/2016 at Unknown time  . mirtazapine (REMERON) 7.5 MG tablet Take 1 tablet by mouth daily.   09/04/2016 at Unknown time  . Multiple Vitamin (MULTIVITAMIN WITH MINERALS) TABS tablet Take 1 tablet by mouth daily.   09/04/2016 at Unknown time  . pantoprazole (PROTONIX) 40 MG tablet TAKE ONE TABLET   BY MOUTH   DAILY 30 tablet 11 09/04/2016 at Unknown time   Scheduled: . atorvastatin  80 mg Oral Daily  . carvedilol  3.125 mg Oral BID WC  . cefTRIAXone (ROCEPHIN)  IV  2 g Intravenous Q24H  . dolutegravir  50 mg Oral Daily  . emtricitabine-tenofovir AF  1 tablet Oral Daily  . insulin aspart  0-9 Units Subcutaneous TID WC  . isosorbide dinitrate  10 mg Oral TID  . mirabegron ER  25 mg Oral Daily  . mirtazapine  7.5 mg Oral Daily  . pantoprazole  40 mg Oral Daily   Continuous: . dextrose 5 % and 0.45% NaCl 1,000 mL with sodium bicarbonate 75 mEq infusion 100 mL/hr at 09/08/16 0452   [id number] **OR** acetaminophen, ondansetron **OR** ondansetron (ZOFRAN) IV  Assesment: He has multiple medical problems. 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 There was discussion about transfer to [facility] but his family doesn't want to do that.  They're having difficulty coming to grips with the situation and decided for sure what they want to do and what they do not. Another meeting is scheduled. His renal function has not improved. He has received IV fluids. Consultation has been requested from nephrology. He is back in sinus rhythm so I have not requested cardiology consultation. He is on anti-retroviral therapy. He has multiple medical problems as noted. I think his prognosis is very poor Active Problems:   CKD (chronic kidney disease)   Diabetes mellitus type 2 in nonobese [facility])   Essential hypertension, benign   Hyperkalemia   CAD (coronary artery disease)   History of stroke   Anemia of chronic kidney failure   Severe malnutrition (HCC)   AKI (acute kidney injury) ([place])   Palliative care encounter   Goals of care, counseling/discussion   DNR (do not resuscitate) discussion   Protein-calorie malnutrition, severe   HIV disease ([place])   Encounter for hospice care discussion    Plan: Continue treatments.    LOS: 3 days  Misa Fedorko L 09/08/2016, 8:37 AM

## 2016-09-09 LAB — CBC
HCT: 29.6 % — ABNORMAL LOW (ref 39.0–52.0)
HEMOGLOBIN: 9.6 g/dL — AB (ref 13.0–17.0)
MCH: 27 pg (ref 26.0–34.0)
MCHC: 32.4 g/dL (ref 30.0–36.0)
MCV: 83.1 fL (ref 78.0–100.0)
PLATELETS: 98 10*3/uL — AB (ref 150–400)
RBC: 3.56 MIL/uL — ABNORMAL LOW (ref 4.22–5.81)
RDW: 15.5 % (ref 11.5–15.5)
WBC: 3.3 10*3/uL — ABNORMAL LOW (ref 4.0–10.5)

## 2016-09-09 LAB — RENAL FUNCTION PANEL
ANION GAP: 4 — AB (ref 5–15)
Albumin: 1.8 g/dL — ABNORMAL LOW (ref 3.5–5.0)
BUN: 92 mg/dL — AB (ref 6–20)
CALCIUM: 7.6 mg/dL — AB (ref 8.9–10.3)
CO2: 26 mmol/L (ref 22–32)
CREATININE: 2.94 mg/dL — AB (ref 0.61–1.24)
Chloride: 112 mmol/L — ABNORMAL HIGH (ref 101–111)
GFR calc Af Amer: 24 mL/min — ABNORMAL LOW (ref >60)
GFR calc non Af Amer: 21 mL/min — ABNORMAL LOW (ref >60)
GLUCOSE: 113 mg/dL — AB (ref 65–99)
PHOSPHORUS: 2.8 mg/dL (ref 2.5–4.6)
Potassium: 3.4 mmol/L — ABNORMAL LOW (ref 3.5–5.1)
SODIUM: 142 mmol/L (ref 135–145)

## 2016-09-09 LAB — GLUCOSE, CAPILLARY
GLUCOSE-CAPILLARY: 106 mg/dL — AB (ref 65–99)
GLUCOSE-CAPILLARY: 145 mg/dL — AB (ref 65–99)
Glucose-Capillary: 107 mg/dL — ABNORMAL HIGH (ref 65–99)
Glucose-Capillary: 167 mg/dL — ABNORMAL HIGH (ref 65–99)

## 2016-09-09 LAB — MAGNESIUM: MAGNESIUM: 1.6 mg/dL — AB (ref 1.7–2.4)

## 2016-09-09 MED ORDER — DRONABINOL 2.5 MG PO CAPS
2.5000 mg | ORAL_CAPSULE | Freq: Two times a day (BID) | ORAL | Status: DC
  Administered 2016-09-09 – 2016-09-12 (×7): 2.5 mg via ORAL
  Filled 2016-09-09 (×7): qty 1

## 2016-09-09 MED ORDER — POTASSIUM CHLORIDE CRYS ER 20 MEQ PO TBCR
40.0000 meq | EXTENDED_RELEASE_TABLET | Freq: Every day | ORAL | Status: DC
  Administered 2016-09-09 – 2016-09-10 (×2): 40 meq via ORAL
  Filled 2016-09-09 (×2): qty 2

## 2016-09-09 MED ORDER — ORAL CARE MOUTH RINSE
15.0000 mL | Freq: Two times a day (BID) | OROMUCOSAL | Status: DC
  Administered 2016-09-09 – 2016-09-12 (×7): 15 mL via OROMUCOSAL

## 2016-09-09 NOTE — Progress Notes (Signed)
LCSW following for DC planing:  Patient is from nursing home: [place] remains for patient to return at discharge. Will continue to assist with discharge planning needs. LCSW updated facility.  In agreement and aware.  Lane Hacker, MSW Clinical Social Work: [employer] Coverage for :  [phone]

## 2016-09-09 NOTE — Progress Notes (Signed)
Daily Progress Note   Patient Name: Keith [name]       Date: 09/09/2016 DOB: 05/18/1951  Age: 66 y.o. MRN#: [id number] Attending Physician: Sinda Du, MD Primary Care Physician: [name], MD Admit Date: 09/04/2016  Reason for Consultation/Follow-up: Disposition, Establishing goals of care and Psychosocial/spiritual support  Subjective: Keith [name] is resting quietly in bed. He makes eye contact when I enter. He seems subdued, and does not smile. Present today at bedside is his brother Keith [name].  Brother Keith [name] states that Keith [name] ate a good breakfast but has not eaten much at lunch. We talk about addition of a new medication to hopefully help with appetite. Neither Keith [name] nor his brother Keith [name] have questions or concerns at this time. We talk about returning to [facility] as a resident, both are in agreement. I encourage them to call at any time, and that I will look to follow [name] at [facility].  Length of Stay: 4  Current Medications: Scheduled Meds:  . atorvastatin  80 mg Oral Daily  . carvedilol  3.125 mg Oral BID WC  . cefTRIAXone (ROCEPHIN)  IV  2 g Intravenous Q24H  . dolutegravir  50 mg Oral Daily  . dronabinol  2.5 mg Oral BID AC  . emtricitabine-tenofovir AF  1 tablet Oral Daily  . insulin aspart  0-9 Units Subcutaneous TID WC  . isosorbide dinitrate  10 mg Oral TID  . mouth rinse  15 mL Mouth Rinse BID  . mirabegron ER  25 mg Oral Daily  . mirtazapine  7.5 mg Oral Daily  . pantoprazole  40 mg Oral Daily  . potassium chloride  40 mEq Oral Daily    Continuous Infusions: . dextrose 5 % and 0.45% NaCl 1,000 mL with sodium bicarbonate 75 mEq infusion 125 mL/hr at 09/09/16 0810    PRN Meds: acetaminophen **OR** acetaminophen, ondansetron **OR** ondansetron (ZOFRAN)  IV  Physical Exam  Constitutional: No distress.  Fan and frail, makes but does not keep eye contact, calm and cooperative  HENT:  Head: Normocephalic and atraumatic.  Temporal wasting  Cardiovascular: Normal rate and regular rhythm.   Pulmonary/Chest: Effort normal. No respiratory distress.  Abdominal: Soft. He exhibits no distension.  Musculoskeletal: He exhibits no edema.  Severe muscle wasting  Neurological: He is alert.  Oriented to person and  place  Skin: Skin is warm and dry.  Nursing note and vitals reviewed.           Vital Signs: BP 128/82 (BP Location: Left Arm)   Pulse 89   Temp 97.9 F (36.6 C) (Oral)   Resp 18   Ht 5\' 7"  (1.702 m)   Wt 47.1 kg (103 lb 12.8 oz)   SpO2 100%   BMI 16.26 kg/m  SpO2: SpO2: 100 % O2 Device: O2 Device: Not Delivered O2 Flow Rate: O2 Flow Rate (L/min): 1 L/min  Intake/output summary:  Intake/Output Summary (Last 24 hours) at 09/09/16 1408 Last data filed at 09/09/16 1000  Gross per 24 hour  Intake          3116.25 ml  Output             2000 ml  Net          1116.25 ml   LBM: Last BM Date: 09/08/16 Baseline Weight: Weight: 49.9 kg (110 lb) Most recent weight: Weight: 47.1 kg (103 lb 12.8 oz)       Palliative Assessment/Data:    Flowsheet Rows     Most Recent Value  Intake Tab  Referral Department  Pulmonary  Unit at Time of Referral  Med/Surg Unit  Palliative Care Primary Diagnosis  Cardiac  Date Notified  09/05/16  Palliative Care Type  New Palliative care  Reason for referral  Clarify Goals of Care  Date of Admission  09/04/16  Date first seen by Palliative Care  09/05/16  # of days Palliative referral response time  0 Day(s)  # of days IP prior to Palliative referral  1  Clinical Assessment  Palliative Performance Scale Score  20%  Pain Max last 24 hours  Not able to report  Pain Min Last 24 hours  Not able to report  Dyspnea Max Last 24 Hours  Not able to report  Dyspnea Min Last 24 hours  Not able to  report  Psychosocial & Spiritual Assessment  Palliative Care Outcomes  Patient/Family meeting held?  Yes  Who was at the meeting?  with brother in law, Jeronimo Greaves via phone, face to face sched for 4/10 at 1300  Palliative Care Outcomes  Provided psychosocial or spiritual support, Clarified goals of care  Patient/Family wishes: Interventions discontinued/not started   Mechanical Ventilation  Palliative Care follow-up planned  -- [Follow-up while at [facility]]      Patient Active Problem List   Diagnosis Date Noted  . Encounter for hospice care discussion   . Protein-calorie malnutrition, severe 09/06/2016  . HIV disease ([place]) 09/06/2016  . Palliative care encounter   . Goals of care, counseling/discussion   . DNR (do not resuscitate) discussion   . AKI (acute kidney injury) ([place]) 09/04/2016  . Malnutrition of moderate degree 06/28/2016  . Altered mental status 06/26/2016  . CVA (cerebral vascular accident) ([place]) 06/26/2016  . Severe malnutrition ([place]) 06/26/2016  . HCAP (healthcare-associated pneumonia) 06/04/2016  . Mucosal abnormality of stomach   . Gastritis and gastroduodenitis 09/30/2015  . Heme positive stool 09/30/2015  . Pancytopenia ([place]) 09/30/2015  . Chest pain 04/28/2015  . Elevated troponin 04/28/2015  . ICD (implantable cardioverter-defibrillator) in place 01/22/2015  . Anemia 09/30/2014  . Gastric polyp   . Reflux esophagitis   . Elevated LFTs 03/26/2014  . Anemia in chronic kidney disease 03/26/2014  . Carotid bruit 12/18/2013  . Acute on chronic systolic heart failure ([place]) 10/24/2013  . CHF (congestive heart failure), NYHA  class III ([place]) 10/23/2013  . Cough 10/23/2013  . CKD (chronic kidney disease) 10/23/2013  . Diabetes mellitus type 2 in nonobese ([place]) 10/23/2013  . Essential hypertension, benign 10/23/2013  . Hyperkalemia 10/23/2013  . CAD (coronary artery disease) 10/23/2013  . History of stroke 10/23/2013  . Anemia of chronic kidney failure  10/23/2013  . Other and unspecified hyperlipidemia 10/23/2013    Palliative Care Assessment & Plan   Patient Profile: 66 y.o.malewith past medical history of Stroke with right-sided weakness, PNCsince January 2018 (left a group home of 2 years due to being non-ambulatory), high blood pressure and high cholesterol, COPD, stage III chronic kidney disease with anemia of chronic disease with Aranespinjections Q2 weeks, chronic systolic CHF class III, alcoholism in remission,, status post AICDadmitted on 4/8/2018with Acute kidney injury on chronic kidney disease stage III. New diagnosis of HIV/AIDS with wasting.  Assessment: Acute kidney injury; no great improvement in kidney function, continue testing. Family is considering focusing on comfort and dignity with hospice in place. 4/13 family has decided to continue with gentle medical treatment at this time. New diagnosis of HIV/AIDS wasting; per infectious disease, ARV started. Waiting on family for direction. 4/13 family has decided to continue with gentle medical treatment at this time.  Recommendations/Plan:  at this point, continue to treat the treatable but no extraordinary measures such as CPR or intubation. Family has decided to continue with gentle medical treatment.  Goals of Care and Additional Recommendations:  Limitations on Scope of Treatment: Continue to treat the treatable, but no extraordinary measures such as CPR or intubation  Code Status:    Code Status Orders        Start     Ordered   09/04/16 2301  Do not attempt resuscitation (DNR)  Continuous    Question Answer Comment  In the event of cardiac or respiratory ARREST Do not call a "code blue"   In the event of cardiac or respiratory ARREST Do not perform Intubation, CPR, defibrillation or ACLS   In the event of cardiac or respiratory ARREST Use medication by any route, position, wound care, and other measures to relive pain and suffering. May use oxygen,  suction and manual treatment of airway obstruction as needed for comfort.      09/04/16 2301    Code Status History    Date Active Date Inactive Code Status Order ID Comments User Context   06/26/2016 10:13 PM 06/30/2016  1:52 PM Full Code [id number]  Karmen Bongo, MD Inpatient   06/04/2016  2:43 AM 06/08/2016  2:50 PM DNR [id number]  Orvan Falconer, MD Inpatient   04/28/2015 11:29 PM 04/29/2015  4:17 PM Full Code [id number]  Oswald Hillock, MD Inpatient   02/06/2014 10:43 AM 02/06/2014  4:29 PM Full Code [id number]  Larey Dresser, MD Inpatient   10/23/2013  7:33 PM 10/28/2013  3:01 PM Full Code [id number]  Louellen Molder, MD Inpatient    Advance Directive Documentation     Most Recent Value  Type of Advance Directive  Out of facility DNR (pink MOST or yellow form)  Pre-existing out of facility DNR order (yellow form or pink MOST form)  Physician notified to receive inpatient order  "MOST" Form in Place?  -       Prognosis:   < 3 months or less, would not be surprising based on 3 hospitalizations in the last 4 months, frailty, malnutrition, bedbound state, worsened by the new diagnosis of HIV/AIDS wasting.  Discharge Planning:  [facility] to  [facility]  Care plan was discussed with nursing staff, case manager, social worker, and Dr. Luan Pulling on next rounds.  Thank you for allowing the Palliative Medicine Team to assist in the care of this patient.   Time In: 1335 Time Out: 1355 Total Time 20 minutes Prolonged Time Billed  no       Greater than 50%  of this time was spent counseling and coordinating care related to the above assessment and plan.  Drue Novel, NP  Please contact Palliative Medicine Team phone at [phone] for questions and concerns.

## 2016-09-09 NOTE — Progress Notes (Signed)
Subjective: Interval History: Patient offers no complaints. He denies any nausea or vomiting. His appetite is poor.  Objective: Vital signs in last 24 hours: Temp:  [97.5 F (36.4 C)-98.5 F (36.9 C)] 97.9 F (36.6 C) (04/13 2248) Pulse Rate:  [77-89] 89 (04/13 0652) Resp:  [18] 18 (04/13 0652) BP: (112-137)/(72-88) 128/82 (04/13 0652) SpO2:  [97 %-100 %] 100 % (04/13 2500) Weight change:   Intake/Output from previous day: 04/12 0701 - 04/13 0700 In: 3970.3 [P.O.:882; I.V.:3088.3] Out: 2400 [Urine:2400] Intake/Output this shift: No intake/output data recorded.  General appearance: alert, cooperative, cachectic and no distress Resp: clear to auscultation bilaterally Cardio: regular rate and rhythm Extremities: No edema  Lab Results:  Recent Labs  09/07/16 0620 09/09/16 0626  WBC  --  3.3*  HGB 11.4* 9.6*  HCT 35.0* 29.6*  PLT  --  98*   BMET:   Recent Labs  09/08/16 0502 09/09/16 0626  NA 142 142  K 4.2 3.4*  CL 117* 112*  CO2 17* 26  GLUCOSE 113* 113*  BUN 128* 92*  CREATININE 4.16* 2.94*  CALCIUM 7.6* 7.6*   No results for input([name]): PTH in the last 72 hours. Iron Studies:   Recent Labs  09/07/16 0620  IRON 38*  TIBC 147*  FERRITIN 402*    Studies/Results: No results found.  I have reviewed the patient'[name] current medications.  Assessment/Plan: Problem #1 acute kidney injury superimposed on chronic. Etiology was thought to be possibly secondary to ATN/prerenal syndrome.His renal function continued to improve. His creatinine however is still above his baseline. .Problem #2 chronic renal failure: Stage III. Possibly a combination of HIV/hypertension/diabetes/recurrent AK/ischemic. Problem #3 sepsis: Blood culture is positive for Escherichia coli and Enterobacter species. Presently is on antibiotics. Patient is afebrile and his white blood cell count is normal. Problem #4 history of nonischemic cardiomyopathy: Status post AICD placement. Patient had  24-hour urine output off 2400 mL. Presently remains asymptomatic. Problem #5 anemia: His hemoglobin has declined below our target goal most likely from hydration. Problem #6 hypertension: Patient was hypotensive most likely from infection Problem #7 history of HIV   Plan: 1] we'll decrease IV fluids to 100 mL per hour 2] we'll check his renal panel and CBC in the morning.   LOS: 4 days   Keith [name] 09/09/2016,8:50 AM

## 2016-09-09 NOTE — Progress Notes (Signed)
Subjective: He's better this morning. He is more alert. Family is at bedside. No complaints. No chest pain nausea vomiting diarrhea. Objective: Vital signs in last 24 hours: Temp:  [97.5 F (36.4 C)-98.5 F (36.9 C)] 97.9 F (36.6 C) (04/13 0652) Pulse Rate:  [77-89] 89 (04/13 0652) Resp:  [18] 18 (04/13 0652) BP: (112-137)/(72-88) 128/82 (04/13 0652) SpO2:  [97 %-100 %] 100 % (04/13 8786) Weight change:  Last BM Date: 09/08/16  Intake/Output from previous day: 04/12 0701 - 04/13 0700 In: 3970.3 [P.O.:882; I.V.:3088.3] Out: 2400 [Urine:2400]  PHYSICAL EXAM General appearance: alert, cooperative, cachectic and mild distress Resp: clear to auscultation bilaterally Cardio: regular rate and rhythm, S1, S2 normal, no murmur, click, rub or gallop GI: soft, non-tender; bowel sounds normal; no masses,  no organomegaly Extremities: extremities normal, atraumatic, no cyanosis or edema Skin warm and dry. Mucous membranes are moist  Lab Results:  Results for orders placed or performed during the hospital encounter of 09/04/16 (from the past 48 hour(s))  Glucose, capillary     Status: None   Collection Time: 09/07/16 11:31 AM  Result Value Ref Range   Glucose-Capillary 86 65 - 99 mg/dL   Comment 1 Notify RN    Comment 2 Document in Chart   Glucose, capillary     Status: None   Collection Time: 09/07/16  4:34 PM  Result Value Ref Range   Glucose-Capillary 99 65 - 99 mg/dL   Comment 1 Notify RN    Comment 2 Document in Chart   Glucose, capillary     Status: Abnormal   Collection Time: 09/07/16  9:37 PM  Result Value Ref Range   Glucose-Capillary 131 (H) 65 - 99 mg/dL   Comment 1 Notify RN    Comment 2 Document in Chart   Basic metabolic panel     Status: Abnormal   Collection Time: 09/08/16  5:02 AM  Result Value Ref Range   Sodium 142 135 - 145 mmol/L   Potassium 4.2 3.5 - 5.1 mmol/L   Chloride 117 (H) 101 - 111 mmol/L   CO2 17 (L) 22 - 32 mmol/L   Glucose, Bld 113 (H) 65 -  99 mg/dL   BUN 128 (H) 6 - 20 mg/dL    Comment: RESULTS CONFIRMED BY MANUAL DILUTION   Creatinine, Ser 4.16 (H) 0.61 - 1.24 mg/dL   Calcium 7.6 (L) 8.9 - 10.3 mg/dL   GFR calc non Af Amer 14 (L) >60 mL/min   GFR calc Af Amer 16 (L) >60 mL/min    Comment: (NOTE) The eGFR has been calculated using the CKD EPI equation. This calculation has not been validated in all clinical situations. eGFR's persistently <60 mL/min signify possible Chronic Kidney Disease.    Anion gap 8 5 - 15  Glucose, capillary     Status: None   Collection Time: 09/08/16  7:46 AM  Result Value Ref Range   Glucose-Capillary 94 65 - 99 mg/dL   Comment 1 Notify RN    Comment 2 Document in Chart   Glucose, capillary     Status: Abnormal   Collection Time: 09/08/16 11:18 AM  Result Value Ref Range   Glucose-Capillary 112 (H) 65 - 99 mg/dL   Comment 1 Notify RN    Comment 2 Document in Chart   Glucose, capillary     Status: Abnormal   Collection Time: 09/08/16  4:19 PM  Result Value Ref Range   Glucose-Capillary 121 (H) 65 - 99 mg/dL   Comment  1 Notify RN    Comment 2 Document in Chart   Glucose, capillary     Status: Abnormal   Collection Time: 09/08/16  9:32 PM  Result Value Ref Range   Glucose-Capillary 162 (H) 65 - 99 mg/dL   Comment 1 Notify RN    Comment 2 Document in Chart   CBC     Status: Abnormal   Collection Time: 09/09/16  6:26 AM  Result Value Ref Range   WBC 3.3 (L) 4.0 - 10.5 K/uL   RBC 3.56 (L) 4.22 - 5.81 MIL/uL   Hemoglobin 9.6 (L) 13.0 - 17.0 g/dL   HCT 29.6 (L) 39.0 - 52.0 %   MCV 83.1 78.0 - 100.0 fL   MCH 27.0 26.0 - 34.0 pg   MCHC 32.4 30.0 - 36.0 g/dL   RDW 15.5 11.5 - 15.5 %   Platelets 98 (L) 150 - 400 K/uL    Comment: SPECIMEN CHECKED FOR CLOTS PLATELET COUNT CONFIRMED BY SMEAR   Renal function panel     Status: Abnormal   Collection Time: 09/09/16  6:26 AM  Result Value Ref Range   Sodium 142 135 - 145 mmol/L   Potassium 3.4 (L) 3.5 - 5.1 mmol/L    Comment: DELTA CHECK  NOTED   Chloride 112 (H) 101 - 111 mmol/L   CO2 26 22 - 32 mmol/L   Glucose, Bld 113 (H) 65 - 99 mg/dL   BUN 92 (H) 6 - 20 mg/dL   Creatinine, Ser 2.94 (H) 0.61 - 1.24 mg/dL   Calcium 7.6 (L) 8.9 - 10.3 mg/dL   Phosphorus 2.8 2.5 - 4.6 mg/dL   Albumin 1.8 (L) 3.5 - 5.0 g/dL   GFR calc non Af Amer 21 (L) >60 mL/min   GFR calc Af Amer 24 (L) >60 mL/min    Comment: (NOTE) The eGFR has been calculated using the CKD EPI equation. This calculation has not been validated in all clinical situations. eGFR's persistently <60 mL/min signify possible Chronic Kidney Disease.    Anion gap 4 (L) 5 - 15  Magnesium     Status: Abnormal   Collection Time: 09/09/16  6:26 AM  Result Value Ref Range   Magnesium 1.6 (L) 1.7 - 2.4 mg/dL  Glucose, capillary     Status: Abnormal   Collection Time: 09/09/16  7:58 AM  Result Value Ref Range   Glucose-Capillary 107 (H) 65 - 99 mg/dL    ABGS No results for input(s): PHART, PO2ART, TCO2, HCO3 in the last 72 hours.  Invalid input(s): PCO2 CULTURES Recent Results (from the past 240 hour(s))  Culture, Urine     Status: Abnormal   Collection Time: 09/04/16  9:23 PM  Result Value Ref Range Status   Specimen Description URINE, CLEAN CATCH  Final   Special Requests NONE  Final   Culture MULTIPLE SPECIES PRESENT, SUGGEST RECOLLECTION (A)  Final   Report Status 09/06/2016 FINAL  Final  Culture, blood (Routine X 2) w Reflex to ID Panel     Status: Abnormal   Collection Time: 09/05/16 10:43 AM  Result Value Ref Range Status   Specimen Description BLOOD RIGHT ARM  Final   Special Requests   Final    BOTTLES DRAWN AEROBIC AND ANAEROBIC Blood Culture adequate volume   Culture  Setup Time   Final    Performed at [facility] AT 2230 ON 4.9.2018 BY ISLEY,B IN BOTH AEROBIC AND ANAEROBIC BOTTLES Organism ID to follow CRITICAL RESULT  CALLED TO, READ BACK BY AND VERIFIED WITH: LISA RUSSELL,RN _0  09/06/16  MKELLY,MLT Performed at [facility], [place] [address] [place], [place] [address]    Culture ESCHERICHIA COLI (A)  Final   Report Status 09/08/2016 FINAL  Final   Organism ID, Bacteria ESCHERICHIA COLI  Final      Susceptibility   Escherichia coli - MIC*    AMPICILLIN >=32 RESISTANT Resistant     CEFAZOLIN <=4 SENSITIVE Sensitive     CEFEPIME <=1 SENSITIVE Sensitive     CEFTAZIDIME <=1 SENSITIVE Sensitive     CEFTRIAXONE <=1 SENSITIVE Sensitive     CIPROFLOXACIN <=0.25 SENSITIVE Sensitive     GENTAMICIN <=1 SENSITIVE Sensitive     IMIPENEM <=0.25 SENSITIVE Sensitive     TRIMETH/SULFA <=20 SENSITIVE Sensitive     AMPICILLIN/SULBACTAM 16 INTERMEDIATE Intermediate     PIP/TAZO <=4 SENSITIVE Sensitive     Extended ESBL NEGATIVE Sensitive     * ESCHERICHIA COLI  Blood Culture ID Panel (Reflexed)     Status: Abnormal   Collection Time: 09/05/16 10:43 AM  Result Value Ref Range Status   Enterococcus species NOT DETECTED NOT DETECTED Final   Listeria monocytogenes NOT DETECTED NOT DETECTED Final   Staphylococcus species NOT DETECTED NOT DETECTED Final   Staphylococcus aureus NOT DETECTED NOT DETECTED Final   Streptococcus species NOT DETECTED NOT DETECTED Final   Streptococcus agalactiae NOT DETECTED NOT DETECTED Final   Streptococcus pneumoniae NOT DETECTED NOT DETECTED Final   Streptococcus pyogenes NOT DETECTED NOT DETECTED Final   Acinetobacter baumannii NOT DETECTED NOT DETECTED Final   Enterobacteriaceae species DETECTED (A) NOT DETECTED Final    Comment: Enterobacteriaceae represent a large family of gram-negative bacteria, not a single organism. CRITICAL RESULT CALLED TO, READ BACK BY AND VERIFIED WITH: LISA RUSSELL,RN _1  09/06/16 MKELLY,MLT    Enterobacter cloacae complex NOT DETECTED NOT DETECTED Final   Escherichia coli DETECTED (A) NOT DETECTED Final    Comment: CRITICAL RESULT CALLED TO, READ BACK BY AND VERIFIED WITH: LISA RUSSELL,RN _2  09/06/16 MKELLY,MLT     Klebsiella oxytoca NOT DETECTED NOT DETECTED Final   Klebsiella pneumoniae NOT DETECTED NOT DETECTED Final   Proteus species NOT DETECTED NOT DETECTED Final   Serratia marcescens NOT DETECTED NOT DETECTED Final   Carbapenem resistance NOT DETECTED NOT DETECTED Final   Haemophilus influenzae NOT DETECTED NOT DETECTED Final   Neisseria meningitidis NOT DETECTED NOT DETECTED Final   Pseudomonas aeruginosa NOT DETECTED NOT DETECTED Final   Candida albicans NOT DETECTED NOT DETECTED Final   Candida glabrata NOT DETECTED NOT DETECTED Final   Candida krusei NOT DETECTED NOT DETECTED Final   Candida parapsilosis NOT DETECTED NOT DETECTED Final   Candida tropicalis NOT DETECTED NOT DETECTED Final    Comment: Performed at [facility], [place] [address] [place], [place] [address]  Culture, blood (Routine X 2) w Reflex to ID Panel     Status: Abnormal   Collection Time: 09/05/16 10:47 AM  Result Value Ref Range Status   Specimen Description BLOOD RIGHT HAND  Final   Special Requests   Final    BOTTLES DRAWN AEROBIC AND ANAEROBIC Blood Culture adequate volume   Culture  Setup Time   Final    GRAM NEGATIVE RODS Performed at [facility] Gram Stain Report Called to,Read Back By and Verified With: RUSSELL,L AT 2230 ON 4.9.2018 BY ISLEY,B IN BOTH AEROBIC AND ANAEROBIC BOTTLES CRITICAL VALUE NOTED.  VALUE IS CONSISTENT WITH PREVIOUSLY REPORTED AND  CALLED VALUE.    Culture (A)  Final    ESCHERICHIA COLI SUSCEPTIBILITIES PERFORMED ON PREVIOUS CULTURE WITHIN THE LAST 5 DAYS. Performed at [facility], [place] [address] [place], [place] [address]    Report Status 09/08/2016 FINAL  Final  MRSA PCR Screening     Status: None   Collection Time: 09/06/16  4:00 PM  Result Value Ref Range Status   MRSA by PCR NEGATIVE NEGATIVE Final    Comment:        The GeneXpert MRSA Assay (FDA approved for NASAL specimens only), is one component of a comprehensive MRSA colonization surveillance  program. It is not intended to diagnose MRSA infection nor to guide or monitor treatment for MRSA infections.    Studies/Results: No results found.  Medications:  Prior to Admission:  Prescriptions Prior to Admission  Medication Sig Dispense Refill Last Dose  . acetaminophen (TYLENOL) 325 MG tablet Take 2 tablets (650 mg total) by mouth every 4 (four) hours as needed for mild pain (or temp > 37.5 C (99.5 F)).   unknown  . aspirin 325 MG tablet Take 1 tablet (325 mg total) by mouth daily.   unknown  . atorvastatin (LIPITOR) 80 MG tablet Take 80 mg by mouth daily.    09/04/2016 at Unknown time  . carvedilol (COREG) 25 MG tablet Take 12.5 mg by mouth 2 (two) times daily with a meal.    09/04/2016 at 1700  . Cholecalciferol (VITAMIN D3) 2000 UNITS TABS Take 2,000 mg by mouth daily.   09/04/2016 at Unknown time  . digoxin (LANOXIN) 0.125 MG tablet TAKE 1/2 TABLET BY MOUTH DAILY 15 tablet 3 09/04/2016 at Unknown time  . feeding supplement, ENSURE ENLIVE, (ENSURE ENLIVE) LIQD Take 237 mLs by mouth 2 (two) times daily between meals. 237 mL 12 09/04/2016 at Unknown time  . ferrous sulfate 325 (65 FE) MG tablet Take 325 mg by mouth daily with breakfast.   09/04/2016 at Unknown time  . furosemide (LASIX) 40 MG tablet Take 40 mg by mouth daily.   09/04/2016 at Unknown time  . isosorbide dinitrate (ISORDIL) 20 MG tablet Take 40 mg by mouth 3 (three) times daily.    09/04/2016 at Unknown time  . mirabegron ER (MYRBETRIQ) 25 MG TB24 tablet Take 25 mg by mouth daily.   09/04/2016 at Unknown time  . mirtazapine (REMERON) 7.5 MG tablet Take 1 tablet by mouth daily.   09/04/2016 at Unknown time  . Multiple Vitamin (MULTIVITAMIN WITH MINERALS) TABS tablet Take 1 tablet by mouth daily.   09/04/2016 at Unknown time  . pantoprazole (PROTONIX) 40 MG tablet TAKE ONE TABLET   BY MOUTH   DAILY 30 tablet 11 09/04/2016 at Unknown time   Scheduled: . atorvastatin  80 mg Oral Daily  . carvedilol  3.125 mg Oral BID WC  . cefTRIAXone  (ROCEPHIN)  IV  2 g Intravenous Q24H  . dolutegravir  50 mg Oral Daily  . emtricitabine-tenofovir AF  1 tablet Oral Daily  . insulin aspart  0-9 Units Subcutaneous TID WC  . isosorbide dinitrate  10 mg Oral TID  . mouth rinse  15 mL Mouth Rinse BID  . mirabegron ER  25 mg Oral Daily  . mirtazapine  7.5 mg Oral Daily  . pantoprazole  40 mg Oral Daily  . potassium chloride  40 mEq Oral Daily   Continuous: . dextrose 5 % and 0.45% NaCl 1,000 mL with sodium bicarbonate 75 mEq infusion 125 mL/hr at 09/09/16  0810   DGN:[id number] **OR** acetaminophen, ondansetron **OR** ondansetron (ZOFRAN) IV  Assesment: He was admitted with acute kidney injury. He appeared to be dehydrated. He has severe malnutrition. While he was in the hospital diagnosis was made of HIV/AIDS and AIDS cachexia. He is on treatment now with anti-retroviral medications. His kidney function is better but not back to baseline. He still not eating well. Active Problems:   CKD (chronic kidney disease)   Diabetes mellitus type 2 in nonobese [facility])   Essential hypertension, benign   Hyperkalemia   CAD (coronary artery disease)   History of stroke   Anemia of chronic kidney failure   Severe malnutrition (HCC)   AKI (acute kidney injury) ([place])   Palliative care encounter   Goals of care, counseling/discussion   DNR (do not resuscitate) discussion   Protein-calorie malnutrition, severe   HIV disease ([place])   Encounter for hospice care discussion    Plan: Continue treatments. Add Marinol. Discussed again with family they understand the situation they want to continue what were doing but not escalate therapy.    LOS: 4 days   Virda Betters L 09/09/2016, 9:07 AM

## 2016-09-09 NOTE — Progress Notes (Signed)
Pharmacy Antibiotic Note  Keith [name] is a 66 y.o. male admitted on 09/04/2016 with UTI and possible other site of infection / bacteremia.  Pharmacy has been consulted for ROCEPHIN dosing.  Plan: Continue Rocephin 2gm IV q24hrs Dose stable / sign off  Height: 5\' 7"  (170.2 cm) Weight: 103 lb 12.8 oz (47.1 kg) IBW/kg (Calculated) : 66.1  Temp (24hrs), Avg:98 F (36.7 C), Min:97.5 F (36.4 C), Max:98.5 F (36.9 C)   Recent Labs Lab 09/04/16 1910 09/05/16 0542 09/06/16 0558 09/07/16 0620 09/08/16 0502 09/09/16 0626  WBC 3.0* 3.1*  --   --   --  3.3*  CREATININE 4.41* 3.71* 3.85* 4.74* 4.16* 2.94*    Estimated Creatinine Clearance: 16.9 mL/min (A) (by C-G formula based on SCr of 2.94 mg/dL (H)).    No Known Allergies  Antimicrobials this admission: Vancomycin 4/9 >> 4/10 Zosyn 4/9 >> 4/10 Rocephin 4/10 >>  Dose adjustments this admission: N/A  Microbiology results: 4/9 BCx:  Lucianne Muss) - sensitive Rocephin 4/8 UCx: pending  Final result Visible to patient:  No (Not Released) Next appt:  09/12/2016 at 09:10 AM in Oncology ([facility])   Ref Range & Units 1d ago  Enterococcus species NOT DETECTED NOT DETECTED   Listeria monocytogenes NOT DETECTED NOT DETECTED   Staphylococcus species NOT DETECTED NOT DETECTED   Staphylococcus aureus NOT DETECTED NOT DETECTED   Streptococcus species NOT DETECTED NOT DETECTED   Streptococcus agalactiae NOT DETECTED NOT DETECTED   Streptococcus pneumoniae NOT DETECTED NOT DETECTED   Streptococcus pyogenes NOT DETECTED NOT DETECTED   Acinetobacter baumannii NOT DETECTED NOT DETECTED   Enterobacteriaceae species NOT DETECTED DETECTED    Comments: Enterobacteriaceae represent a large family of gram-negative bacteria, not a single organism.  CRITICAL RESULT CALLED TO, READ BACK BY AND VERIFIED WITH:  LISA RUSSELL,RN @0417  09/06/16 MKELLY,MLT   Enterobacter cloacae complex NOT DETECTED NOT DETECTED   Escherichia coli NOT DETECTED  DETECTED    Comments: CRITICAL RESULT CALLED TO, READ BACK BY AND VERIFIED WITH:  LISA RUSSELL,RN @0417  09/06/16 MKELLY,MLT   Klebsiella oxytoca NOT DETECTED NOT DETECTED   Klebsiella pneumoniae NOT DETECTED NOT DETECTED   Proteus species NOT DETECTED NOT DETECTED   Serratia marcescens NOT DETECTED NOT DETECTED   Carbapenem resistance NOT DETECTED NOT DETECTED   Haemophilus influenzae NOT DETECTED NOT DETECTED   Neisseria meningitidis NOT DETECTED NOT DETECTED   Pseudomonas aeruginosa NOT DETECTED NOT DETECTED   Candida albicans NOT DETECTED NOT DETECTED   Candida glabrata NOT DETECTED NOT DETECTED   Candida krusei NOT DETECTED NOT DETECTED   Candida parapsilosis NOT DETECTED NOT DETECTED   Candida tropicalis NOT DETECTED NOT DETECTED        Thank you for allowing pharmacy to be a part of this patient's care.  Pricilla Larsson, [facility] 09/09/2016 2:09 PM

## 2016-09-10 LAB — RENAL FUNCTION PANEL
ANION GAP: 4 — AB (ref 5–15)
Albumin: 1.7 g/dL — ABNORMAL LOW (ref 3.5–5.0)
BUN: 60 mg/dL — ABNORMAL HIGH (ref 6–20)
CHLORIDE: 110 mmol/L (ref 101–111)
CO2: 28 mmol/L (ref 22–32)
Calcium: 7.3 mg/dL — ABNORMAL LOW (ref 8.9–10.3)
Creatinine, Ser: 2 mg/dL — ABNORMAL HIGH (ref 0.61–1.24)
GFR calc Af Amer: 39 mL/min — ABNORMAL LOW (ref >60)
GFR calc non Af Amer: 34 mL/min — ABNORMAL LOW (ref >60)
Glucose, Bld: 126 mg/dL — ABNORMAL HIGH (ref 65–99)
POTASSIUM: 3.8 mmol/L (ref 3.5–5.1)
Phosphorus: 1.9 mg/dL — ABNORMAL LOW (ref 2.5–4.6)
Sodium: 142 mmol/L (ref 135–145)

## 2016-09-10 LAB — CBC
HCT: 29.3 % — ABNORMAL LOW (ref 39.0–52.0)
HEMOGLOBIN: 9.7 g/dL — AB (ref 13.0–17.0)
MCH: 27.8 pg (ref 26.0–34.0)
MCHC: 33.1 g/dL (ref 30.0–36.0)
MCV: 84 fL (ref 78.0–100.0)
Platelets: 101 10*3/uL — ABNORMAL LOW (ref 150–400)
RBC: 3.49 MIL/uL — AB (ref 4.22–5.81)
RDW: 15.8 % — ABNORMAL HIGH (ref 11.5–15.5)
WBC: 2.1 10*3/uL — ABNORMAL LOW (ref 4.0–10.5)

## 2016-09-10 LAB — GLUCOSE, CAPILLARY
GLUCOSE-CAPILLARY: 174 mg/dL — AB (ref 65–99)
GLUCOSE-CAPILLARY: 131 mg/dL — AB (ref 65–99)
Glucose-Capillary: 120 mg/dL — ABNORMAL HIGH (ref 65–99)
Glucose-Capillary: 91 mg/dL (ref 65–99)

## 2016-09-10 MED ORDER — SODIUM CHLORIDE 0.9 % IV SOLN
INTRAVENOUS | Status: DC
  Administered 2016-09-10 – 2016-09-11 (×3): via INTRAVENOUS

## 2016-09-10 MED ORDER — K PHOS MONO-SOD PHOS DI & MONO 155-852-130 MG PO TABS
500.0000 mg | ORAL_TABLET | Freq: Two times a day (BID) | ORAL | Status: DC
  Administered 2016-09-10 – 2016-09-11 (×4): 500 mg via ORAL
  Filled 2016-09-10 (×5): qty 2

## 2016-09-10 NOTE — Progress Notes (Signed)
Subjective: He is much better. He ate better. He has no new complaints. No chest pain nausea vomiting diarrhea.  Objective: Vital signs in last 24 hours: Temp:  [98.2 F (36.8 C)-98.8 F (37.1 C)] 98.2 F (36.8 C) (04/14 0630) Pulse Rate:  [74-83] 82 (04/14 0630) Resp:  [20] 20 (04/14 0630) BP: (120-142)/(74-84) 120/74 (04/14 0630) SpO2:  [100 %] 100 % (04/14 0630) Weight change:  Last BM Date: 09/09/16  Intake/Output from previous day: 04/13 0701 - 04/14 0700 In: 3575 [P.O.:600; I.V.:2875; IV Piggyback:100] Out: 2900 [Urine:2900]  PHYSICAL EXAM General appearance: alert, cooperative, cachectic and no distress Resp: clear to auscultation bilaterally Cardio: regular rate and rhythm, S1, S2 normal, no murmur, click, rub or gallop GI: soft, non-tender; bowel sounds normal; no masses,  no organomegaly Extremities: extremities normal, atraumatic, no cyanosis or edema Skin warm and dry. Much more alert  Lab Results:  Results for orders placed or performed during the hospital encounter of 09/04/16 (from the past 48 hour(s))  Glucose, capillary     Status: Abnormal   Collection Time: 09/08/16 11:18 AM  Result Value Ref Range   Glucose-Capillary 112 (H) 65 - 99 mg/dL   Comment 1 Notify RN    Comment 2 Document in Chart   Glucose, capillary     Status: Abnormal   Collection Time: 09/08/16  4:19 PM  Result Value Ref Range   Glucose-Capillary 121 (H) 65 - 99 mg/dL   Comment 1 Notify RN    Comment 2 Document in Chart   Glucose, capillary     Status: Abnormal   Collection Time: 09/08/16  9:32 PM  Result Value Ref Range   Glucose-Capillary 162 (H) 65 - 99 mg/dL   Comment 1 Notify RN    Comment 2 Document in Chart   CBC     Status: Abnormal   Collection Time: 09/09/16  6:26 AM  Result Value Ref Range   WBC 3.3 (L) 4.0 - 10.5 K/uL   RBC 3.56 (L) 4.22 - 5.81 MIL/uL   Hemoglobin 9.6 (L) 13.0 - 17.0 g/dL   HCT 29.6 (L) 39.0 - 52.0 %   MCV 83.1 78.0 - 100.0 fL   MCH 27.0 26.0 -  34.0 pg   MCHC 32.4 30.0 - 36.0 g/dL   RDW 15.5 11.5 - 15.5 %   Platelets 98 (L) 150 - 400 K/uL    Comment: SPECIMEN CHECKED FOR CLOTS PLATELET COUNT CONFIRMED BY SMEAR   Renal function panel     Status: Abnormal   Collection Time: 09/09/16  6:26 AM  Result Value Ref Range   Sodium 142 135 - 145 mmol/L   Potassium 3.4 (L) 3.5 - 5.1 mmol/L    Comment: DELTA CHECK NOTED   Chloride 112 (H) 101 - 111 mmol/L   CO2 26 22 - 32 mmol/L   Glucose, Bld 113 (H) 65 - 99 mg/dL   BUN 92 (H) 6 - 20 mg/dL   Creatinine, Ser 2.94 (H) 0.61 - 1.24 mg/dL   Calcium 7.6 (L) 8.9 - 10.3 mg/dL   Phosphorus 2.8 2.5 - 4.6 mg/dL   Albumin 1.8 (L) 3.5 - 5.0 g/dL   GFR calc non Af Amer 21 (L) >60 mL/min   GFR calc Af Amer 24 (L) >60 mL/min    Comment: (NOTE) The eGFR has been calculated using the CKD EPI equation. This calculation has not been validated in all clinical situations. eGFR's persistently <60 mL/min signify possible Chronic Kidney Disease.    Anion gap  4 (L) 5 - 15  Magnesium     Status: Abnormal   Collection Time: 09/09/16  6:26 AM  Result Value Ref Range   Magnesium 1.6 (L) 1.7 - 2.4 mg/dL  Glucose, capillary     Status: Abnormal   Collection Time: 09/09/16  7:58 AM  Result Value Ref Range   Glucose-Capillary 107 (H) 65 - 99 mg/dL  Glucose, capillary     Status: Abnormal   Collection Time: 09/09/16 11:31 AM  Result Value Ref Range   Glucose-Capillary 167 (H) 65 - 99 mg/dL  Glucose, capillary     Status: Abnormal   Collection Time: 09/09/16  4:17 PM  Result Value Ref Range   Glucose-Capillary 106 (H) 65 - 99 mg/dL   Comment 1 Notify RN    Comment 2 Document in Chart   Glucose, capillary     Status: Abnormal   Collection Time: 09/09/16 11:20 PM  Result Value Ref Range   Glucose-Capillary 145 (H) 65 - 99 mg/dL  CBC     Status: Abnormal   Collection Time: 09/10/16  6:32 AM  Result Value Ref Range   WBC 2.1 (L) 4.0 - 10.5 K/uL   RBC 3.49 (L) 4.22 - 5.81 MIL/uL   Hemoglobin 9.7 (L)  13.0 - 17.0 g/dL   HCT 29.3 (L) 39.0 - 52.0 %   MCV 84.0 78.0 - 100.0 fL   MCH 27.8 26.0 - 34.0 pg   MCHC 33.1 30.0 - 36.0 g/dL   RDW 15.8 (H) 11.5 - 15.5 %   Platelets 101 (L) 150 - 400 K/uL    Comment: SPECIMEN CHECKED FOR CLOTS PLATELET COUNT CONFIRMED BY SMEAR   Renal function panel     Status: Abnormal   Collection Time: 09/10/16  6:32 AM  Result Value Ref Range   Sodium 142 135 - 145 mmol/L   Potassium 3.8 3.5 - 5.1 mmol/L   Chloride 110 101 - 111 mmol/L   CO2 28 22 - 32 mmol/L   Glucose, Bld 126 (H) 65 - 99 mg/dL   BUN 60 (H) 6 - 20 mg/dL   Creatinine, Ser 2.00 (H) 0.61 - 1.24 mg/dL   Calcium 7.3 (L) 8.9 - 10.3 mg/dL   Phosphorus 1.9 (L) 2.5 - 4.6 mg/dL   Albumin 1.7 (L) 3.5 - 5.0 g/dL   GFR calc non Af Amer 34 (L) >60 mL/min   GFR calc Af Amer 39 (L) >60 mL/min    Comment: (NOTE) The eGFR has been calculated using the CKD EPI equation. This calculation has not been validated in all clinical situations. eGFR's persistently <60 mL/min signify possible Chronic Kidney Disease.    Anion gap 4 (L) 5 - 15  Glucose, capillary     Status: Abnormal   Collection Time: 09/10/16  8:05 AM  Result Value Ref Range   Glucose-Capillary 120 (H) 65 - 99 mg/dL   Comment 1 Notify RN    Comment 2 Document in Chart     ABGS No results for input(s): PHART, PO2ART, TCO2, HCO3 in the last 72 hours.  Invalid input(s): PCO2 CULTURES Recent Results (from the past 240 hour(s))  Culture, Urine     Status: Abnormal   Collection Time: 09/04/16  9:23 PM  Result Value Ref Range Status   Specimen Description URINE, CLEAN CATCH  Final   Special Requests NONE  Final   Culture MULTIPLE SPECIES PRESENT, SUGGEST RECOLLECTION (A)  Final   Report Status 09/06/2016 FINAL  Final  Culture, blood (Routine X  2) w Reflex to ID Panel     Status: Abnormal   Collection Time: 09/05/16 10:43 AM  Result Value Ref Range Status   Specimen Description BLOOD RIGHT ARM  Final   Special Requests   Final     BOTTLES DRAWN AEROBIC AND ANAEROBIC Blood Culture adequate volume   Culture  Setup Time   Final    Performed at [facility] AT 2230 ON 4.9.2018 BY ISLEY,B IN BOTH AEROBIC AND ANAEROBIC BOTTLES Organism ID to follow CRITICAL RESULT CALLED TO, READ BACK BY AND VERIFIED WITH: LISA RUSSELL,RN _0  09/06/16 MKELLY,MLT Performed at [facility], [place] [name] [place], [place] [address]    Culture ESCHERICHIA COLI (A)  Final   Report Status 09/08/2016 FINAL  Final   Organism ID, Bacteria ESCHERICHIA COLI  Final      Susceptibility   Escherichia coli - MIC*    AMPICILLIN >=32 RESISTANT Resistant     CEFAZOLIN <=4 SENSITIVE Sensitive     CEFEPIME <=1 SENSITIVE Sensitive     CEFTAZIDIME <=1 SENSITIVE Sensitive     CEFTRIAXONE <=1 SENSITIVE Sensitive     CIPROFLOXACIN <=0.25 SENSITIVE Sensitive     GENTAMICIN <=1 SENSITIVE Sensitive     IMIPENEM <=0.25 SENSITIVE Sensitive     TRIMETH/SULFA <=20 SENSITIVE Sensitive     AMPICILLIN/SULBACTAM 16 INTERMEDIATE Intermediate     PIP/TAZO <=4 SENSITIVE Sensitive     Extended ESBL NEGATIVE Sensitive     * ESCHERICHIA COLI  Blood Culture ID Panel (Reflexed)     Status: Abnormal   Collection Time: 09/05/16 10:43 AM  Result Value Ref Range Status   Enterococcus species NOT DETECTED NOT DETECTED Final   Listeria monocytogenes NOT DETECTED NOT DETECTED Final   Staphylococcus species NOT DETECTED NOT DETECTED Final   Staphylococcus aureus NOT DETECTED NOT DETECTED Final   Streptococcus species NOT DETECTED NOT DETECTED Final   Streptococcus agalactiae NOT DETECTED NOT DETECTED Final   Streptococcus pneumoniae NOT DETECTED NOT DETECTED Final   Streptococcus pyogenes NOT DETECTED NOT DETECTED Final   Acinetobacter baumannii NOT DETECTED NOT DETECTED Final   Enterobacteriaceae species DETECTED (A) NOT DETECTED Final    Comment: Enterobacteriaceae represent a large family of gram-negative bacteria, not a single  organism. CRITICAL RESULT CALLED TO, READ BACK BY AND VERIFIED WITH: LISA RUSSELL,RN _1  09/06/16 MKELLY,MLT    Enterobacter cloacae complex NOT DETECTED NOT DETECTED Final   Escherichia coli DETECTED (A) NOT DETECTED Final    Comment: CRITICAL RESULT CALLED TO, READ BACK BY AND VERIFIED WITH: LISA RUSSELL,RN _2  09/06/16 MKELLY,MLT    Klebsiella oxytoca NOT DETECTED NOT DETECTED Final   Klebsiella pneumoniae NOT DETECTED NOT DETECTED Final   Proteus species NOT DETECTED NOT DETECTED Final   Serratia marcescens NOT DETECTED NOT DETECTED Final   Carbapenem resistance NOT DETECTED NOT DETECTED Final   Haemophilus influenzae NOT DETECTED NOT DETECTED Final   Neisseria meningitidis NOT DETECTED NOT DETECTED Final   Pseudomonas aeruginosa NOT DETECTED NOT DETECTED Final   Candida albicans NOT DETECTED NOT DETECTED Final   Candida glabrata NOT DETECTED NOT DETECTED Final   Candida krusei NOT DETECTED NOT DETECTED Final   Candida parapsilosis NOT DETECTED NOT DETECTED Final   Candida tropicalis NOT DETECTED NOT DETECTED Final    Comment: Performed at [facility], [place] [address], [place], [place] [address]  Culture, blood (Routine X 2) w Reflex to ID Panel     Status: Abnormal   Collection  Time: 09/05/16 10:47 AM  Result Value Ref Range Status   Specimen Description BLOOD RIGHT HAND  Final   Special Requests   Final    BOTTLES DRAWN AEROBIC AND ANAEROBIC Blood Culture adequate volume   Culture  Setup Time   Final    GRAM NEGATIVE RODS Performed at [facility] Gram Stain Report Called to,Read Back By and Verified With: RUSSELL,L AT 2230 ON 4.9.2018 BY ISLEY,B IN BOTH AEROBIC AND ANAEROBIC BOTTLES CRITICAL VALUE NOTED.  VALUE IS CONSISTENT WITH PREVIOUSLY REPORTED AND CALLED VALUE.    Culture (A)  Final    ESCHERICHIA COLI SUSCEPTIBILITIES PERFORMED ON PREVIOUS CULTURE WITHIN THE LAST 5 DAYS. Performed at [facility], [place] [address], [place], [place] [address]     Report Status 09/08/2016 FINAL  Final  MRSA PCR Screening     Status: None   Collection Time: 09/06/16  4:00 PM  Result Value Ref Range Status   MRSA by PCR NEGATIVE NEGATIVE Final    Comment:        The GeneXpert MRSA Assay (FDA approved for NASAL specimens only), is one component of a comprehensive MRSA colonization surveillance program. It is not intended to diagnose MRSA infection nor to guide or monitor treatment for MRSA infections.    Studies/Results: No results found.  Medications:  Prior to Admission:  Prescriptions Prior to Admission  Medication Sig Dispense Refill Last Dose  . acetaminophen (TYLENOL) 325 MG tablet Take 2 tablets (650 mg total) by mouth every 4 (four) hours as needed for mild pain (or temp > 37.5 C (99.5 F)).   unknown  . aspirin 325 MG tablet Take 1 tablet (325 mg total) by mouth daily.   unknown  . atorvastatin (LIPITOR) 80 MG tablet Take 80 mg by mouth daily.    09/04/2016 at Unknown time  . carvedilol (COREG) 25 MG tablet Take 12.5 mg by mouth 2 (two) times daily with a meal.    09/04/2016 at 1700  . Cholecalciferol (VITAMIN D3) 2000 UNITS TABS Take 2,000 mg by mouth daily.   09/04/2016 at Unknown time  . digoxin (LANOXIN) 0.125 MG tablet TAKE 1/2 TABLET BY MOUTH DAILY 15 tablet 3 09/04/2016 at Unknown time  . feeding supplement, ENSURE ENLIVE, (ENSURE ENLIVE) LIQD Take 237 mLs by mouth 2 (two) times daily between meals. 237 mL 12 09/04/2016 at Unknown time  . ferrous sulfate 325 (65 FE) MG tablet Take 325 mg by mouth daily with breakfast.   09/04/2016 at Unknown time  . furosemide (LASIX) 40 MG tablet Take 40 mg by mouth daily.   09/04/2016 at Unknown time  . isosorbide dinitrate (ISORDIL) 20 MG tablet Take 40 mg by mouth 3 (three) times daily.    09/04/2016 at Unknown time  . mirabegron ER (MYRBETRIQ) 25 MG TB24 tablet Take 25 mg by mouth daily.   09/04/2016 at Unknown time  . mirtazapine (REMERON) 7.5 MG tablet Take 1 tablet by mouth daily.   09/04/2016 at Unknown  time  . Multiple Vitamin (MULTIVITAMIN WITH MINERALS) TABS tablet Take 1 tablet by mouth daily.   09/04/2016 at Unknown time  . pantoprazole (PROTONIX) 40 MG tablet TAKE ONE TABLET   BY MOUTH   DAILY 30 tablet 11 09/04/2016 at Unknown time   Scheduled: . atorvastatin  80 mg Oral Daily  . carvedilol  3.125 mg Oral BID WC  . cefTRIAXone (ROCEPHIN)  IV  2 g Intravenous Q24H  . dolutegravir  50 mg Oral Daily  .  dronabinol  2.5 mg Oral BID AC  . emtricitabine-tenofovir AF  1 tablet Oral Daily  . insulin aspart  0-9 Units Subcutaneous TID WC  . isosorbide dinitrate  10 mg Oral TID  . mouth rinse  15 mL Mouth Rinse BID  . mirabegron ER  25 mg Oral Daily  . mirtazapine  7.5 mg Oral Daily  . pantoprazole  40 mg Oral Daily  . phosphorus  500 mg Oral BID   Continuous: . sodium chloride     [id number] **OR** acetaminophen, ondansetron **OR** ondansetron (ZOFRAN) IV  Assesment: He was admitted with acute on chronic renal failure. He is improving. 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 He has severe malnutrition. He is known to have coronary artery occlusive disease diabetes hypertension congestive heart failure previous stroke. All of this is stable at this point. He is eating better. His kidney function is improving. Active Problems:   CKD (chronic kidney disease)   Diabetes mellitus type 2 in nonobese [facility])   Essential hypertension, benign   Hyperkalemia   CAD (coronary artery disease)   History of stroke   Anemia of chronic kidney failure   Severe malnutrition (HCC)   AKI (acute kidney injury) ([place])   Palliative care encounter   Goals of care, counseling/discussion   DNR (do not resuscitate) discussion   Protein-calorie malnutrition, severe   HIV disease ([place])   Encounter for hospice care discussion    Plan: Continue treatments. His creatinine is down to 2 and I think he will probably be able to go back to  skilled care facility in the next 48 hours    LOS: 5 days   Matalynn Graff L 09/10/2016, 10:40 AM

## 2016-09-10 NOTE — Progress Notes (Addendum)
Subjective: Interval History: Patient is feeling much better. He denies any nausea or vomiting. His appetite is good. Patient denies any difficulty breathing.  Objective: Vital signs in last 24 hours: Temp:  [98.2 F (36.8 C)-98.8 F (37.1 C)] 98.2 F (36.8 C) (04/14 0630) Pulse Rate:  [74-83] 82 (04/14 0630) Resp:  [20] 20 (04/14 0630) BP: (120-142)/(74-84) 120/74 (04/14 0630) SpO2:  [100 %] 100 % (04/14 0630) Weight change:   Intake/Output from previous day: 04/13 0701 - 04/14 0700 In: 3575 [P.O.:600; I.V.:2875; IV Piggyback:100] Out: 2900 [Urine:2900] Intake/Output this shift: No intake/output data recorded.  General appearance: alert, cooperative, cachectic and no distress Resp: clear to auscultation bilaterally Cardio: regular rate and rhythm Extremities: No edema  Lab Results:  Recent Labs  09/09/16 0626 09/10/16 0632  WBC 3.3* 2.1*  HGB 9.6* 9.7*  HCT 29.6* 29.3*  PLT 98* 101*   BMET:   Recent Labs  09/09/16 0626 09/10/16 0632  NA 142 142  K 3.4* 3.8  CL 112* 110  CO2 26 28  GLUCOSE 113* 126*  BUN 92* 60*  CREATININE 2.94* 2.00*  CALCIUM 7.6* 7.3*   No results for input(s): PTH in the last 72 hours. Iron Studies:  No results for input(s): IRON, TIBC, TRANSFERRIN, FERRITIN in the last 72 hours.  Studies/Results: No results found.  I have reviewed the patient's current medications.  Assessment/Plan: Problem #1 acute kidney injury superimposed on chronic. Etiology was thought to be possibly secondary to ATN/prerenal syndrome.His renal function continued to improve. Presently he doesn't have any uremic signs and symptoms. His creatinine is returning to his baseline. .Problem #2 chronic renal failure: Stage III. Possibly a combination of HIV/hypertension/diabetes/recurrent AK/ischemic. Problem #3 sepsis: Blood culture is positive for Escherichia coli and Enterobacter species. Presently is on antibiotics. Patient is afebrile and his white blood cell  count is low. Problem #4 history of nonischemic cardiomyopathy: Status post AICD placement. Patient had 24-hour urine output off 2900 mL. Presently remains asymptomatic. Patient presently does not have any sign of fluid overload. Problem #5 anemia: His hemoglobin has declined below our target goal most likely from hydration. His hemoglobin remains stable. Problem #6 hypertension: Patient was hypotensive most likely from infection Problem #7 history of HIV   Problem #8 hypokalemia: His potassium is corrected. Presently on potassium supplements. Problem #9 hypophosphatemia: Possibly from poor nutrition. Plan: 1] we'll DC potassium supplement 2] we'll start patient on  Neutra-Phos 500 mg by mouth twice a day 3] we'll check his renal panel,Phosphorus  and CBC in the morning. 4] we'll change his IV fluid to normal sign at 75 mL per hour.   LOS: 5 days   Muskaan Smet S 09/10/2016,8:55 AM

## 2016-09-11 LAB — RENAL FUNCTION PANEL
ANION GAP: 4 — AB (ref 5–15)
Albumin: 1.8 g/dL — ABNORMAL LOW (ref 3.5–5.0)
BUN: 41 mg/dL — ABNORMAL HIGH (ref 6–20)
CALCIUM: 7.5 mg/dL — AB (ref 8.9–10.3)
CO2: 27 mmol/L (ref 22–32)
Chloride: 113 mmol/L — ABNORMAL HIGH (ref 101–111)
Creatinine, Ser: 1.81 mg/dL — ABNORMAL HIGH (ref 0.61–1.24)
GFR, EST AFRICAN AMERICAN: 44 mL/min — AB (ref >60)
GFR, EST NON AFRICAN AMERICAN: 38 mL/min — AB (ref >60)
Glucose, Bld: 131 mg/dL — ABNORMAL HIGH (ref 65–99)
Phosphorus: 2.6 mg/dL (ref 2.5–4.6)
Potassium: 4.6 mmol/L (ref 3.5–5.1)
SODIUM: 144 mmol/L (ref 135–145)

## 2016-09-11 LAB — GLUCOSE, CAPILLARY
GLUCOSE-CAPILLARY: 60 mg/dL — AB (ref 65–99)
GLUCOSE-CAPILLARY: 129 mg/dL — AB (ref 65–99)
GLUCOSE-CAPILLARY: 118 mg/dL — AB (ref 65–99)
GLUCOSE-CAPILLARY: 123 mg/dL — AB (ref 65–99)
GLUCOSE-CAPILLARY: 71 mg/dL (ref 65–99)

## 2016-09-11 NOTE — Progress Notes (Signed)
Subjective: He was refusing his blood draws earlier this morning. He has agreed now and blood is being drawn. He has no other new complaints. He is eating better. Kidney function has been improving but of course is pending now.  Objective: Vital signs in last 24 hours: Temp:  [98 F (36.7 C)-98.8 F (37.1 C)] 98.8 F (37.1 C) (04/15 0621) Pulse Rate:  [67-81] 67 (04/15 0621) Resp:  [20] 20 (04/15 0621) BP: (118-148)/(64-85) 148/85 (04/15 0621) SpO2:  [98 %-100 %] 100 % (04/15 0621) Weight change:  Last BM Date: 09/09/16  Intake/Output from previous day: 04/14 0701 - 04/15 0700 In: 1491.3 [P.O.:720; I.V.:771.3] Out: 1400 [Urine:1400]  PHYSICAL EXAM General appearance: alert, cooperative, cachectic and no distress Resp: rhonchi bilaterally Cardio: regular rate and rhythm, S1, S2 normal, no murmur, click, rub or gallop GI: soft, non-tender; bowel sounds normal; no masses,  no organomegaly Extremities: extremities normal, atraumatic, no cyanosis or edema Skin: Warm and dry  Lab Results:  Results for orders placed or performed during the hospital encounter of 09/04/16 (from the past 48 hour(s))  Glucose, capillary     Status: Abnormal   Collection Time: 09/09/16 11:31 AM  Result Value Ref Range   Glucose-Capillary 167 (H) 65 - 99 mg/dL  Glucose, capillary     Status: Abnormal   Collection Time: 09/09/16  4:17 PM  Result Value Ref Range   Glucose-Capillary 106 (H) 65 - 99 mg/dL   Comment 1 Notify RN    Comment 2 Document in Chart   Glucose, capillary     Status: Abnormal   Collection Time: 09/09/16 11:20 PM  Result Value Ref Range   Glucose-Capillary 145 (H) 65 - 99 mg/dL  CBC     Status: Abnormal   Collection Time: 09/10/16  6:32 AM  Result Value Ref Range   WBC 2.1 (L) 4.0 - 10.5 K/uL   RBC 3.49 (L) 4.22 - 5.81 MIL/uL   Hemoglobin 9.7 (L) 13.0 - 17.0 g/dL   HCT 29.3 (L) 39.0 - 52.0 %   MCV 84.0 78.0 - 100.0 fL   MCH 27.8 26.0 - 34.0 pg   MCHC 33.1 30.0 - 36.0 g/dL    RDW 15.8 (H) 11.5 - 15.5 %   Platelets 101 (L) 150 - 400 K/uL    Comment: SPECIMEN CHECKED FOR CLOTS PLATELET COUNT CONFIRMED BY SMEAR   Renal function panel     Status: Abnormal   Collection Time: 09/10/16  6:32 AM  Result Value Ref Range   Sodium 142 135 - 145 mmol/L   Potassium 3.8 3.5 - 5.1 mmol/L   Chloride 110 101 - 111 mmol/L   CO2 28 22 - 32 mmol/L   Glucose, Bld 126 (H) 65 - 99 mg/dL   BUN 60 (H) 6 - 20 mg/dL   Creatinine, Ser 2.00 (H) 0.61 - 1.24 mg/dL   Calcium 7.3 (L) 8.9 - 10.3 mg/dL   Phosphorus 1.9 (L) 2.5 - 4.6 mg/dL   Albumin 1.7 (L) 3.5 - 5.0 g/dL   GFR calc non Af Amer 34 (L) >60 mL/min   GFR calc Af Amer 39 (L) >60 mL/min    Comment: (NOTE) The eGFR has been calculated using the CKD EPI equation. This calculation has not been validated in all clinical situations. eGFR's persistently <60 mL/min signify possible Chronic Kidney Disease.    Anion gap 4 (L) 5 - 15  Glucose, capillary     Status: Abnormal   Collection Time: 09/10/16  8:05 AM  Result Value Ref Range   Glucose-Capillary 120 (H) 65 - 99 mg/dL   Comment 1 Notify RN    Comment 2 Document in Chart   Glucose, capillary     Status: Abnormal   Collection Time: 09/10/16 11:35 AM  Result Value Ref Range   Glucose-Capillary 174 (H) 65 - 99 mg/dL   Comment 1 Notify RN    Comment 2 Document in Chart   Glucose, capillary     Status: None   Collection Time: 09/10/16  4:28 PM  Result Value Ref Range   Glucose-Capillary 91 65 - 99 mg/dL   Comment 1 Notify RN    Comment 2 Document in Chart   Glucose, capillary     Status: Abnormal   Collection Time: 09/10/16 10:51 PM  Result Value Ref Range   Glucose-Capillary 131 (H) 65 - 99 mg/dL  Glucose, capillary     Status: Abnormal   Collection Time: 09/11/16  7:40 AM  Result Value Ref Range   Glucose-Capillary 60 (L) 65 - 99 mg/dL  Renal function panel     Status: Abnormal   Collection Time: 09/11/16  9:17 AM  Result Value Ref Range   Sodium 144 135 - 145  mmol/L   Potassium 4.6 3.5 - 5.1 mmol/L    Comment: DELTA CHECK NOTED NO VISIBLE HEMOLYSIS    Chloride 113 (H) 101 - 111 mmol/L   CO2 27 22 - 32 mmol/L   Glucose, Bld 131 (H) 65 - 99 mg/dL   BUN 41 (H) 6 - 20 mg/dL   Creatinine, Ser 1.81 (H) 0.61 - 1.24 mg/dL   Calcium 7.5 (L) 8.9 - 10.3 mg/dL   Phosphorus 2.6 2.5 - 4.6 mg/dL   Albumin 1.8 (L) 3.5 - 5.0 g/dL   GFR calc non Af Amer 38 (L) >60 mL/min   GFR calc Af Amer 44 (L) >60 mL/min    Comment: (NOTE) The eGFR has been calculated using the CKD EPI equation. This calculation has not been validated in all clinical situations. eGFR's persistently <60 mL/min signify possible Chronic Kidney Disease.    Anion gap 4 (L) 5 - 15  Glucose, capillary     Status: Abnormal   Collection Time: 09/11/16  9:46 AM  Result Value Ref Range   Glucose-Capillary 129 (H) 65 - 99 mg/dL    ABGS No results for input(s): PHART, PO2ART, TCO2, HCO3 in the last 72 hours.  Invalid input(s): PCO2 CULTURES Recent Results (from the past 240 hour(s))  Culture, Urine     Status: Abnormal   Collection Time: 09/04/16  9:23 PM  Result Value Ref Range Status   Specimen Description URINE, CLEAN CATCH  Final   Special Requests NONE  Final   Culture MULTIPLE SPECIES PRESENT, SUGGEST RECOLLECTION (A)  Final   Report Status 09/06/2016 FINAL  Final  Culture, blood (Routine X 2) w Reflex to ID Panel     Status: Abnormal   Collection Time: 09/05/16 10:43 AM  Result Value Ref Range Status   Specimen Description BLOOD RIGHT ARM  Final   Special Requests   Final    BOTTLES DRAWN AEROBIC AND ANAEROBIC Blood Culture adequate volume   Culture  Setup Time   Final    Performed at [facility] AT 2230 ON 4.9.2018 BY ISLEY,B IN BOTH AEROBIC AND ANAEROBIC BOTTLES Organism ID to follow CRITICAL RESULT CALLED TO, READ BACK BY AND VERIFIED WITH: LISA RUSSELL,RN '@[id number]'  09/06/16 MKELLY,MLT Performed at [facility]  Lab, [address] [place], [place] [address]    Culture ESCHERICHIA COLI (A)  Final   Report Status 09/08/2016 FINAL  Final   Organism ID, Bacteria ESCHERICHIA COLI  Final      Susceptibility   Escherichia coli - MIC*    AMPICILLIN >=32 RESISTANT Resistant     CEFAZOLIN <=4 SENSITIVE Sensitive     CEFEPIME <=1 SENSITIVE Sensitive     CEFTAZIDIME <=1 SENSITIVE Sensitive     CEFTRIAXONE <=1 SENSITIVE Sensitive     CIPROFLOXACIN <=0.25 SENSITIVE Sensitive     GENTAMICIN <=1 SENSITIVE Sensitive     IMIPENEM <=0.25 SENSITIVE Sensitive     TRIMETH/SULFA <=20 SENSITIVE Sensitive     AMPICILLIN/SULBACTAM 16 INTERMEDIATE Intermediate     PIP/TAZO <=4 SENSITIVE Sensitive     Extended ESBL NEGATIVE Sensitive     * ESCHERICHIA COLI  Blood Culture ID Panel (Reflexed)     Status: Abnormal   Collection Time: 09/05/16 10:43 AM  Result Value Ref Range Status   Enterococcus species NOT DETECTED NOT DETECTED Final   Listeria monocytogenes NOT DETECTED NOT DETECTED Final   Staphylococcus species NOT DETECTED NOT DETECTED Final   Staphylococcus aureus NOT DETECTED NOT DETECTED Final   Streptococcus species NOT DETECTED NOT DETECTED Final   Streptococcus agalactiae NOT DETECTED NOT DETECTED Final   Streptococcus pneumoniae NOT DETECTED NOT DETECTED Final   Streptococcus pyogenes NOT DETECTED NOT DETECTED Final   Acinetobacter baumannii NOT DETECTED NOT DETECTED Final   Enterobacteriaceae species DETECTED (A) NOT DETECTED Final    Comment: Enterobacteriaceae represent a large family of gram-negative bacteria, not a single organism. CRITICAL RESULT CALLED TO, READ BACK BY AND VERIFIED WITH: LISA RUSSELL,RN '@[id number]'  09/06/16 MKELLY,MLT    Enterobacter cloacae complex NOT DETECTED NOT DETECTED Final   Escherichia coli DETECTED (A) NOT DETECTED Final    Comment: CRITICAL RESULT CALLED TO, READ BACK BY AND VERIFIED WITH: LISA RUSSELL,RN '@[id number]'  09/06/16 MKELLY,MLT    Klebsiella oxytoca NOT DETECTED NOT DETECTED Final    Klebsiella pneumoniae NOT DETECTED NOT DETECTED Final   Proteus species NOT DETECTED NOT DETECTED Final   Serratia marcescens NOT DETECTED NOT DETECTED Final   Carbapenem resistance NOT DETECTED NOT DETECTED Final   Haemophilus influenzae NOT DETECTED NOT DETECTED Final   Neisseria meningitidis NOT DETECTED NOT DETECTED Final   Pseudomonas aeruginosa NOT DETECTED NOT DETECTED Final   Candida albicans NOT DETECTED NOT DETECTED Final   Candida glabrata NOT DETECTED NOT DETECTED Final   Candida krusei NOT DETECTED NOT DETECTED Final   Candida parapsilosis NOT DETECTED NOT DETECTED Final   Candida tropicalis NOT DETECTED NOT DETECTED Final    Comment: Performed at [facility], [place] [address] [place], [place] [address]  Culture, blood (Routine X 2) w Reflex to ID Panel     Status: Abnormal   Collection Time: 09/05/16 10:47 AM  Result Value Ref Range Status   Specimen Description BLOOD RIGHT HAND  Final   Special Requests   Final    BOTTLES DRAWN AEROBIC AND ANAEROBIC Blood Culture adequate volume   Culture  Setup Time   Final    GRAM NEGATIVE RODS Performed at [facility] Gram Stain Report Called to,Read Back By and Verified With: RUSSELL,L AT 2230 ON 4.9.2018 BY ISLEY,B IN BOTH AEROBIC AND ANAEROBIC BOTTLES CRITICAL VALUE NOTED.  VALUE IS CONSISTENT WITH PREVIOUSLY REPORTED AND CALLED VALUE.    Culture (A)  Final    ESCHERICHIA COLI SUSCEPTIBILITIES PERFORMED ON PREVIOUS  CULTURE WITHIN THE LAST 5 DAYS. Performed at [facility], [place] [address], [place], [place] [address]    Report Status 09/08/2016 FINAL  Final  MRSA PCR Screening     Status: None   Collection Time: 09/06/16  4:00 PM  Result Value Ref Range Status   MRSA by PCR NEGATIVE NEGATIVE Final    Comment:        The GeneXpert MRSA Assay (FDA approved for NASAL specimens only), is one component of a comprehensive MRSA colonization surveillance program. It is not intended to diagnose MRSA infection  nor to guide or monitor treatment for MRSA infections.    Studies/Results: No results found.  Medications:  Prior to Admission:  Prescriptions Prior to Admission  Medication Sig Dispense Refill Last Dose  . acetaminophen (TYLENOL) 325 MG tablet Take 2 tablets (650 mg total) by mouth every 4 (four) hours as needed for mild pain (or temp > 37.5 C (99.5 F)).   unknown  . aspirin 325 MG tablet Take 1 tablet (325 mg total) by mouth daily.   unknown  . atorvastatin (LIPITOR) 80 MG tablet Take 80 mg by mouth daily.    09/04/2016 at Unknown time  . carvedilol (COREG) 25 MG tablet Take 12.5 mg by mouth 2 (two) times daily with a meal.    09/04/2016 at 1700  . Cholecalciferol (VITAMIN D3) 2000 UNITS TABS Take 2,000 mg by mouth daily.   09/04/2016 at Unknown time  . digoxin (LANOXIN) 0.125 MG tablet TAKE 1/2 TABLET BY MOUTH DAILY 15 tablet 3 09/04/2016 at Unknown time  . feeding supplement, ENSURE ENLIVE, (ENSURE ENLIVE) LIQD Take 237 mLs by mouth 2 (two) times daily between meals. 237 mL 12 09/04/2016 at Unknown time  . ferrous sulfate 325 (65 FE) MG tablet Take 325 mg by mouth daily with breakfast.   09/04/2016 at Unknown time  . furosemide (LASIX) 40 MG tablet Take 40 mg by mouth daily.   09/04/2016 at Unknown time  . isosorbide dinitrate (ISORDIL) 20 MG tablet Take 40 mg by mouth 3 (three) times daily.    09/04/2016 at Unknown time  . mirabegron ER (MYRBETRIQ) 25 MG TB24 tablet Take 25 mg by mouth daily.   09/04/2016 at Unknown time  . mirtazapine (REMERON) 7.5 MG tablet Take 1 tablet by mouth daily.   09/04/2016 at Unknown time  . Multiple Vitamin (MULTIVITAMIN WITH MINERALS) TABS tablet Take 1 tablet by mouth daily.   09/04/2016 at Unknown time  . pantoprazole (PROTONIX) 40 MG tablet TAKE ONE TABLET   BY MOUTH   DAILY 30 tablet 11 09/04/2016 at Unknown time   Scheduled: . atorvastatin  80 mg Oral Daily  . carvedilol  3.125 mg Oral BID WC  . cefTRIAXone (ROCEPHIN)  IV  2 g Intravenous Q24H  . dolutegravir  50 mg  Oral Daily  . dronabinol  2.5 mg Oral BID AC  . emtricitabine-tenofovir AF  1 tablet Oral Daily  . insulin aspart  0-9 Units Subcutaneous TID WC  . isosorbide dinitrate  10 mg Oral TID  . mouth rinse  15 mL Mouth Rinse BID  . mirabegron ER  25 mg Oral Daily  . mirtazapine  7.5 mg Oral Daily  . pantoprazole  40 mg Oral Daily  . phosphorus  500 mg Oral BID   Continuous: . sodium chloride 75 mL/hr at 09/11/16 0122   HRC:BULAGTXMIWOEH **OR** acetaminophen, ondansetron **OR** ondansetron (ZOFRAN) IV  Assesment: He was admitted with dehydration and acute on chronic  renal failure. He has multiple other medical problems including coronary artery occlusive disease, congestive heart failure, severe malnutrition previous stroke diabetes hypertension and he has been diagnosed this visit with HIV/AIDS. He has HIV/AIDS wasting disease. He is eating better. His renal function has improved. Active Problems:   CKD (chronic kidney disease)   Diabetes mellitus type 2 in nonobese [facility])   Essential hypertension, benign   Hyperkalemia   CAD (coronary artery disease)   History of stroke   Anemia of chronic kidney failure   Severe malnutrition (HCC)   AKI (acute kidney injury) ([place])   Palliative care encounter   Goals of care, counseling/discussion   DNR (do not resuscitate) discussion   Protein-calorie malnutrition, severe   HIV disease ([place])   Encounter for hospice care discussion    Plan: He may be able to be transferred back to skilled care facility in the next 24 hours    LOS: 6 days   Ali Mohl L 09/11/2016, 10:19 AM

## 2016-09-11 NOTE — Progress Notes (Signed)
Subjective: Interval History: Patient offers no complaints. He denies any nausea or vomiting.  Objective: Vital signs in last 24 hours: Temp:  [98 F (36.7 C)-98.8 F (37.1 C)] 98.8 F (37.1 C) (04/15 0621) Pulse Rate:  [67-81] 67 (04/15 0621) Resp:  [20] 20 (04/15 0621) BP: (118-148)/(64-85) 148/85 (04/15 0621) SpO2:  [98 %-100 %] 100 % (04/15 0621) Weight change:   Intake/Output from previous day: 04/14 0701 - 04/15 0700 In: 1491.3 [P.O.:720; I.V.:771.3] Out: 1400 [Urine:1400] Intake/Output this shift: No intake/output data recorded.  General appearance: alert, cooperative, cachectic and no distress Resp: clear to auscultation bilaterally Cardio: regular rate and rhythm Extremities: No edema  Lab Results:  Recent Labs  09/09/16 0626 09/10/16 0632  WBC 3.3* 2.1*  HGB 9.6* 9.7*  HCT 29.6* 29.3*  PLT 98* 101*   BMET:   Recent Labs  09/09/16 0626 09/10/16 0632  NA 142 142  K 3.4* 3.8  CL 112* 110  CO2 26 28  GLUCOSE 113* 126*  BUN 92* 60*  CREATININE 2.94* 2.00*  CALCIUM 7.6* 7.3*   No results for input(s): PTH in the last 72 hours. Iron Studies:  No results for input(s): IRON, TIBC, TRANSFERRIN, FERRITIN in the last 72 hours.  Studies/Results: No results found.  I have reviewed the patient's current medications.  Assessment/Plan: Problem #1 acute kidney injury superimposed on chronic. Etiology was thought to be possibly secondary to ATN/prerenal syndrome.His renal function continued to improve. Patient non-oliguric and asymptomatic.Marland Kitchen  Marland KitchenProblem #2 chronic renal failure: Stage III. Possibly a combination of HIV/hypertension/diabetes/recurrent AK/ischemic. Problem #3 sepsis: Blood culture is positive for Escherichia coli and Enterobacter species. Presently is on antibiotics. Pa Problem #4 history of nonischemic cardiomyopathy: Status post AICD placement. Patient had 24-hour urine output is 1400. Patient at this moment denies any difficulty  breathing. Problem #5 anemia: His hemoglobin has declined below our target goal . His hemoglobin remains stable. Problem #6 hypertension: Patient was hypotensive most likely from infection Problem #7 history of HIV   Problem #8 hypokalemia: His potassium is corrected. Patient presently on Neutra-Phos Problem #9 hypophosphatemia: Possibly from poor nutrition. Patient on Neutra-Phos. Plan: 1] we'll continue with present treatment 2] we'll check his renal panel,Phosphorus  and CBC in the morning.    LOS: 6 days   Denali Becvar S 09/11/2016,9:34 AM

## 2016-09-11 NOTE — Progress Notes (Signed)
Pt refused labwork this morning. Lab staff will attempt to get labwork later this morning.

## 2016-09-11 NOTE — Progress Notes (Signed)
Informed by central telemetry that pt had 5 beat run of Vtach. Vital signs are as follows    09/11/16 1230  Vitals  Temp 97.5 F (36.4 C)  Temp Source Oral  BP 122/75  BP Location Right Arm  BP Method Automatic  Patient Position (if appropriate) Lying  Pulse Rate 68  Pulse Rate Source Dinamap  Resp 20  Oxygen Therapy  SpO2 100 %  O2 Device Room Air  Pain Assessment  Pain Assessment No/denies pain  Pain Score 0   Vital signs are stable at this time. Pt denies chest pain or discomfort and is asymptomatic at this time. MD verbally informed. Will continue to monitor patient throughout the shift.

## 2016-09-12 ENCOUNTER — Other Ambulatory Visit (HOSPITAL_COMMUNITY): Payer: Medicare HMO

## 2016-09-12 ENCOUNTER — Inpatient Hospital Stay
Admission: RE | Admit: 2016-09-12 | Discharge: 2016-12-09 | Disposition: A | Discharge status: Nursing Home (Includes ICF) | Payer: Medicare HMO | Source: Ambulatory Visit | Attending: Internal Medicine | Admitting: Internal Medicine

## 2016-09-12 ENCOUNTER — Ambulatory Visit (HOSPITAL_COMMUNITY): Payer: Medicare HMO

## 2016-09-12 DIAGNOSIS — R7881 Bacteremia: Secondary | ICD-10-CM | POA: Diagnosis present

## 2016-09-12 DIAGNOSIS — J449 Chronic obstructive pulmonary disease, unspecified: Secondary | ICD-10-CM | POA: Diagnosis present

## 2016-09-12 LAB — RENAL FUNCTION PANEL
Albumin: 1.8 g/dL — ABNORMAL LOW (ref 3.5–5.0)
BUN: 32 mg/dL — AB (ref 6–20)
CALCIUM: 7.2 mg/dL — AB (ref 8.9–10.3)
CO2: 26 mmol/L (ref 22–32)
CREATININE: 1.51 mg/dL — AB (ref 0.61–1.24)
Chloride: 112 mmol/L — ABNORMAL HIGH (ref 101–111)
GFR calc Af Amer: 55 mL/min — ABNORMAL LOW (ref >60)
GFR calc non Af Amer: 47 mL/min — ABNORMAL LOW (ref >60)
GLUCOSE: 79 mg/dL (ref 65–99)
Phosphorus: 3.1 mg/dL (ref 2.5–4.6)
Potassium: 4.3 mmol/L (ref 3.5–5.1)
Sodium: 140 mmol/L (ref 135–145)

## 2016-09-12 LAB — CBC
HCT: 31 % — ABNORMAL LOW (ref 39.0–52.0)
Hemoglobin: 10.1 g/dL — ABNORMAL LOW (ref 13.0–17.0)
MCH: 27.6 pg (ref 26.0–34.0)
MCHC: 32.6 g/dL (ref 30.0–36.0)
MCV: 84.7 fL (ref 78.0–100.0)
PLATELETS: 104 10*3/uL — AB (ref 150–400)
RBC: 3.66 MIL/uL — ABNORMAL LOW (ref 4.22–5.81)
RDW: 15.5 % (ref 11.5–15.5)
WBC: 2.4 10*3/uL — AB (ref 4.0–10.5)

## 2016-09-12 LAB — GLUCOSE, CAPILLARY
Glucose-Capillary: 63 mg/dL — ABNORMAL LOW (ref 65–99)
Glucose-Capillary: 90 mg/dL (ref 65–99)

## 2016-09-12 MED ORDER — CEFUROXIME AXETIL 500 MG PO TABS: 500.0000 mg | ORAL_TABLET | Freq: Two times a day (BID) | ORAL | Status: DC

## 2016-09-12 MED ORDER — CARVEDILOL 3.125 MG PO TABS: 3.1250 mg | ORAL_TABLET | Freq: Two times a day (BID) | ORAL | 12 refills | Status: DC

## 2016-09-12 MED ORDER — EMTRICITABINE-TENOFOVIR AF 200-25 MG PO TABS: 1.0000 | ORAL_TABLET | Freq: Every day | ORAL | Status: DC

## 2016-09-12 MED ORDER — DOLUTEGRAVIR SODIUM 50 MG PO TABS: 50.0000 mg | ORAL_TABLET | Freq: Every day | ORAL | Status: DC

## 2016-09-12 MED ORDER — ISOSORBIDE DINITRATE 10 MG PO TABS
10.0000 mg | ORAL_TABLET | Freq: Three times a day (TID) | ORAL | Status: DC
Start: 2016-09-12 — End: 2016-10-10

## 2016-09-12 MED ORDER — DRONABINOL 2.5 MG PO CAPS: 2.5000 mg | ORAL_CAPSULE | Freq: Two times a day (BID) | ORAL | Status: DC

## 2016-09-12 NOTE — Progress Notes (Signed)
Subjective: Interval History: Patient denies any nausea or vomiting. He doesn't have also very difficulty breathing. Overall is feeling much better  Objective: Vital signs in last 24 hours: Temp:  [97.5 F (36.4 C)-98.5 F (36.9 C)] 98.5 F (36.9 C) (04/16 0641) Pulse Rate:  [68-75] 75 (04/16 0641) Resp:  [16-20] 16 (04/16 0641) BP: (122-151)/(62-82) 148/62 (04/16 0641) SpO2:  [100 %] 100 % (04/16 0641) Weight change:   Intake/Output from previous day: 04/15 0701 - 04/16 0700 In: 240 [P.O.:240] Out: 1550 [Urine:1550] Intake/Output this shift: No intake/output data recorded.  General appearance: alert, cooperative, cachectic and no distress Resp: clear to auscultation bilaterally Cardio: regular rate and rhythm Extremities: No edema  Lab Results:  Recent Labs  09/10/16 0632 09/12/16 0611  WBC 2.1* 2.4*  HGB 9.7* 10.1*  HCT 29.3* 31.0*  PLT 101* 104*   BMET:   Recent Labs  09/11/16 0917 09/12/16 0611  NA 144 140  K 4.6 4.3  CL 113* 112*  CO2 27 26  GLUCOSE 131* 79  BUN 41* 32*  CREATININE 1.81* 1.51*  CALCIUM 7.5* 7.2*   No results for input(s): PTH in the last 72 hours. Iron Studies:  No results for input(s): IRON, TIBC, TRANSFERRIN, FERRITIN in the last 72 hours.  Studies/Results: No results found.  I have reviewed the patient's current medications.  Assessment/Plan: Problem #1 acute kidney injury superimposed on chronic. Etiology was thought to be possibly secondary to ATN/prerenal syndrome.His renal function continued to improve. His creatinine has returned to his baseline.  .Problem #2 chronic renal failure: Stage III. Possibly a combination of HIV/hypertension/diabetes/recurrent AK/ischemic. Problem #3 sepsis: Blood culture is positive for Escherichia coli and Enterobacter species. Presently is on antibiotics.  Problem #4 history of nonischemic cardiomyopathy: Status post AICD placement. Patient had 1500 mL of urine output the last 24 hours and is  asymptomatic. Problem #5 anemia: His hemoglobin has returned to our target goal. Problem #6 hypertension: Patient was hypotensive most likely from infection Problem #7 history of HIV   Problem #8 hypokalemia: His potassium is corrected. Patient presently on Neutra-Phos Problem #9 hypophosphatemia: Patient presently on Neutra-Phos and his phosphorus is normal.. Plan: 1] since his renal function has returned to his baseline we'll DC IV fluid 2] we'll DC Neutra-Phos.     LOS: 7 days   Ahnesti Townsend S 09/12/2016,7:26 AM

## 2016-09-12 NOTE — Care Management Note (Signed)
Case Management Note  Patient Details  Name: Keith Young MRN: [id number] Date of Birth: [birth year]  Expected Discharge Date:  09/12/16               Expected Discharge Plan:  [place]  In-House Referral:  Clinical Social Work, Hospice / Palliative Care  Discharge planning Services  CM Consult  Post Acute Care Choice:  NA Choice offered to:  NA  Status of Service:  Completed, signed off  Additional Comments: Pt discharging back to SNF today. No hospice services at this time. CSW to make arrangements for return to facility.   Sherald Barge, RN 09/12/2016, 10:04 AM

## 2016-09-12 NOTE — Progress Notes (Signed)
Subjective: He is generally better. He's eating better. His renal function has improved and is actually a little bit better than his baseline. He has no new complaints. No chest pain no nausea no vomiting  Objective: Vital signs in last 24 hours: Temp:  [97.5 F (36.4 C)-98.5 F (36.9 C)] 98.5 F (36.9 C) (04/16 0641) Pulse Rate:  [68-75] 75 (04/16 0641) Resp:  [16-20] 16 (04/16 0641) BP: (122-151)/(62-82) 148/62 (04/16 0641) SpO2:  [100 %] 100 % (04/16 0641) Weight change:  Last BM Date: 09/09/16  Intake/Output from previous day: 04/15 0701 - 04/16 0700 In: 240 [P.O.:240] Out: 1550 [Urine:1550]  PHYSICAL EXAM General appearance: alert, cooperative and no distress Resp: clear to auscultation bilaterally Cardio: regular rate and rhythm, S1, S2 normal, no murmur, click, rub or gallop GI: soft, non-tender; bowel sounds normal; no masses,  no organomegaly Extremities: extremities normal, atraumatic, no cyanosis or edema He is very thin. He is still mildly confused  Lab Results:  Results for orders placed or performed during the hospital encounter of 09/04/16 (from the past 48 hour(s))  Glucose, capillary     Status: Abnormal   Collection Time: 09/10/16 11:35 AM  Result Value Ref Range   Glucose-Capillary 174 (H) 65 - 99 mg/dL   Comment 1 Notify RN    Comment 2 Document in Chart   Glucose, capillary     Status: None   Collection Time: 09/10/16  4:28 PM  Result Value Ref Range   Glucose-Capillary 91 65 - 99 mg/dL   Comment 1 Notify RN    Comment 2 Document in Chart   Glucose, capillary     Status: Abnormal   Collection Time: 09/10/16 10:51 PM  Result Value Ref Range   Glucose-Capillary 131 (H) 65 - 99 mg/dL  Glucose, capillary     Status: Abnormal   Collection Time: 09/11/16  7:40 AM  Result Value Ref Range   Glucose-Capillary 60 (L) 65 - 99 mg/dL  Renal function panel     Status: Abnormal   Collection Time: 09/11/16  9:17 AM  Result Value Ref Range   Sodium 144 135 -  145 mmol/L   Potassium 4.6 3.5 - 5.1 mmol/L    Comment: DELTA CHECK NOTED NO VISIBLE HEMOLYSIS    Chloride 113 (H) 101 - 111 mmol/L   CO2 27 22 - 32 mmol/L   Glucose, Bld 131 (H) 65 - 99 mg/dL   BUN 41 (H) 6 - 20 mg/dL   Creatinine, Ser 1.81 (H) 0.61 - 1.24 mg/dL   Calcium 7.5 (L) 8.9 - 10.3 mg/dL   Phosphorus 2.6 2.5 - 4.6 mg/dL   Albumin 1.8 (L) 3.5 - 5.0 g/dL   GFR calc non Af Amer 38 (L) >60 mL/min   GFR calc Af Amer 44 (L) >60 mL/min    Comment: (NOTE) The eGFR has been calculated using the CKD EPI equation. This calculation has not been validated in all clinical situations. eGFR's persistently <60 mL/min signify possible Chronic Kidney Disease.    Anion gap 4 (L) 5 - 15  Glucose, capillary     Status: Abnormal   Collection Time: 09/11/16  9:46 AM  Result Value Ref Range   Glucose-Capillary 129 (H) 65 - 99 mg/dL  Glucose, capillary     Status: Abnormal   Collection Time: 09/11/16 11:04 AM  Result Value Ref Range   Glucose-Capillary 118 (H) 65 - 99 mg/dL  Glucose, capillary     Status: Abnormal   Collection Time: 09/11/16  4:05 PM  Result Value Ref Range   Glucose-Capillary 123 (H) 65 - 99 mg/dL  Glucose, capillary     Status: None   Collection Time: 09/11/16 10:35 PM  Result Value Ref Range   Glucose-Capillary 71 65 - 99 mg/dL  CBC     Status: Abnormal   Collection Time: 09/12/16  6:11 AM  Result Value Ref Range   WBC 2.4 (L) 4.0 - 10.5 K/uL   RBC 3.66 (L) 4.22 - 5.81 MIL/uL   Hemoglobin 10.1 (L) 13.0 - 17.0 g/dL   HCT 31.0 (L) 39.0 - 52.0 %   MCV 84.7 78.0 - 100.0 fL   MCH 27.6 26.0 - 34.0 pg   MCHC 32.6 30.0 - 36.0 g/dL   RDW 15.5 11.5 - 15.5 %   Platelets 104 (L) 150 - 400 K/uL    Comment: SPECIMEN CHECKED FOR CLOTS CONSISTENT WITH PREVIOUS RESULT   Renal function panel     Status: Abnormal   Collection Time: 09/12/16  6:11 AM  Result Value Ref Range   Sodium 140 135 - 145 mmol/L   Potassium 4.3 3.5 - 5.1 mmol/L   Chloride 112 (H) 101 - 111 mmol/L    CO2 26 22 - 32 mmol/L   Glucose, Bld 79 65 - 99 mg/dL   BUN 32 (H) 6 - 20 mg/dL   Creatinine, Ser 1.51 (H) 0.61 - 1.24 mg/dL   Calcium 7.2 (L) 8.9 - 10.3 mg/dL   Phosphorus 3.1 2.5 - 4.6 mg/dL   Albumin 1.8 (L) 3.5 - 5.0 g/dL   GFR calc non Af Amer 47 (L) >60 mL/min   GFR calc Af Amer 55 (L) >60 mL/min    Comment: (NOTE) The eGFR has been calculated using the CKD EPI equation. This calculation has not been validated in all clinical situations. eGFR's persistently <60 mL/min signify possible Chronic Kidney Disease.   Glucose, capillary     Status: Abnormal   Collection Time: 09/12/16  7:36 AM  Result Value Ref Range   Glucose-Capillary 63 (L) 65 - 99 mg/dL    ABGS No results for input(s): PHART, PO2ART, TCO2, HCO3 in the last 72 hours.  Invalid input(s): PCO2 CULTURES Recent Results (from the past 240 hour(s))  Culture, Urine     Status: Abnormal   Collection Time: 09/04/16  9:23 PM  Result Value Ref Range Status   Specimen Description URINE, CLEAN CATCH  Final   Special Requests NONE  Final   Culture MULTIPLE SPECIES PRESENT, SUGGEST RECOLLECTION (A)  Final   Report Status 09/06/2016 FINAL  Final  Culture, blood (Routine X 2) w Reflex to ID Panel     Status: Abnormal   Collection Time: 09/05/16 10:43 AM  Result Value Ref Range Status   Specimen Description BLOOD RIGHT ARM  Final   Special Requests   Final    BOTTLES DRAWN AEROBIC AND ANAEROBIC Blood Culture adequate volume   Culture  Setup Time   Final    Performed at [facility] AT 2230 ON 4.9.2018 BY ISLEY,B IN BOTH AEROBIC AND ANAEROBIC BOTTLES Organism ID to follow CRITICAL RESULT CALLED TO, READ BACK BY AND VERIFIED WITH: LISA RUSSELL,RN '@[id number]'  09/06/16 MKELLY,MLT Performed at [facility], [place] [address], [place], [place] [address]    Culture ESCHERICHIA COLI (A)  Final   Report Status 09/08/2016 FINAL  Final   Organism ID, Bacteria ESCHERICHIA COLI  Final       Susceptibility   Escherichia  coli - MIC*    AMPICILLIN >=32 RESISTANT Resistant     CEFAZOLIN <=4 SENSITIVE Sensitive     CEFEPIME <=1 SENSITIVE Sensitive     CEFTAZIDIME <=1 SENSITIVE Sensitive     CEFTRIAXONE <=1 SENSITIVE Sensitive     CIPROFLOXACIN <=0.25 SENSITIVE Sensitive     GENTAMICIN <=1 SENSITIVE Sensitive     IMIPENEM <=0.25 SENSITIVE Sensitive     TRIMETH/SULFA <=20 SENSITIVE Sensitive     AMPICILLIN/SULBACTAM 16 INTERMEDIATE Intermediate     PIP/TAZO <=4 SENSITIVE Sensitive     Extended ESBL NEGATIVE Sensitive     * ESCHERICHIA COLI  Blood Culture ID Panel (Reflexed)     Status: Abnormal   Collection Time: 09/05/16 10:43 AM  Result Value Ref Range Status   Enterococcus species NOT DETECTED NOT DETECTED Final   Listeria monocytogenes NOT DETECTED NOT DETECTED Final   Staphylococcus species NOT DETECTED NOT DETECTED Final   Staphylococcus aureus NOT DETECTED NOT DETECTED Final   Streptococcus species NOT DETECTED NOT DETECTED Final   Streptococcus agalactiae NOT DETECTED NOT DETECTED Final   Streptococcus pneumoniae NOT DETECTED NOT DETECTED Final   Streptococcus pyogenes NOT DETECTED NOT DETECTED Final   Acinetobacter baumannii NOT DETECTED NOT DETECTED Final   Enterobacteriaceae species DETECTED (A) NOT DETECTED Final    Comment: Enterobacteriaceae represent a large family of gram-negative bacteria, not a single organism. CRITICAL RESULT CALLED TO, READ BACK BY AND VERIFIED WITH: LISA RUSSELL,RN '@[id number]'  09/06/16 MKELLY,MLT    Enterobacter cloacae complex NOT DETECTED NOT DETECTED Final   Escherichia coli DETECTED (A) NOT DETECTED Final    Comment: CRITICAL RESULT CALLED TO, READ BACK BY AND VERIFIED WITH: LISA RUSSELL,RN '@[id number]'  09/06/16 MKELLY,MLT    Klebsiella oxytoca NOT DETECTED NOT DETECTED Final   Klebsiella pneumoniae NOT DETECTED NOT DETECTED Final   Proteus species NOT DETECTED NOT DETECTED Final   Serratia marcescens NOT DETECTED NOT DETECTED Final    Carbapenem resistance NOT DETECTED NOT DETECTED Final   Haemophilus influenzae NOT DETECTED NOT DETECTED Final   Neisseria meningitidis NOT DETECTED NOT DETECTED Final   Pseudomonas aeruginosa NOT DETECTED NOT DETECTED Final   Candida albicans NOT DETECTED NOT DETECTED Final   Candida glabrata NOT DETECTED NOT DETECTED Final   Candida krusei NOT DETECTED NOT DETECTED Final   Candida parapsilosis NOT DETECTED NOT DETECTED Final   Candida tropicalis NOT DETECTED NOT DETECTED Final    Comment: Performed at [facility], [place] [address] [place], [place] [address]  Culture, blood (Routine X 2) w Reflex to ID Panel     Status: Abnormal   Collection Time: 09/05/16 10:47 AM  Result Value Ref Range Status   Specimen Description BLOOD RIGHT HAND  Final   Special Requests   Final    BOTTLES DRAWN AEROBIC AND ANAEROBIC Blood Culture adequate volume   Culture  Setup Time   Final    GRAM NEGATIVE RODS Performed at [facility] Gram Stain Report Called to,Read Back By and Verified With: RUSSELL,L AT 2230 ON 4.9.2018 BY ISLEY,B IN BOTH AEROBIC AND ANAEROBIC BOTTLES CRITICAL VALUE NOTED.  VALUE IS CONSISTENT WITH PREVIOUSLY REPORTED AND CALLED VALUE.    Culture (A)  Final    ESCHERICHIA COLI SUSCEPTIBILITIES PERFORMED ON PREVIOUS CULTURE WITHIN THE LAST 5 DAYS. Performed at [facility], [place] [address] [place], [place] [address]    Report Status 09/08/2016 FINAL  Final  MRSA PCR Screening     Status: None   Collection Time: 09/06/16  4:00 PM  Result Value Ref Range Status   MRSA by PCR NEGATIVE NEGATIVE Final    Comment:        The GeneXpert MRSA Assay (FDA approved for NASAL specimens only), is one component of a comprehensive MRSA colonization surveillance program. It is not intended to diagnose MRSA infection nor to guide or monitor treatment for MRSA infections.    Studies/Results: No results found.  Medications:  Prior to Admission:  Prescriptions Prior to  Admission  Medication Sig Dispense Refill Last Dose  . acetaminophen (TYLENOL) 325 MG tablet Take 2 tablets (650 mg total) by mouth every 4 (four) hours as needed for mild pain (or temp > 37.5 C (99.5 F)).   unknown  . aspirin 325 MG tablet Take 1 tablet (325 mg total) by mouth daily.   unknown  . atorvastatin (LIPITOR) 80 MG tablet Take 80 mg by mouth daily.    09/04/2016 at Unknown time  . carvedilol (COREG) 25 MG tablet Take 12.5 mg by mouth 2 (two) times daily with a meal.    09/04/2016 at 1700  . Cholecalciferol (VITAMIN D3) 2000 UNITS TABS Take 2,000 mg by mouth daily.   09/04/2016 at Unknown time  . digoxin (LANOXIN) 0.125 MG tablet TAKE 1/2 TABLET BY MOUTH DAILY 15 tablet 3 09/04/2016 at Unknown time  . feeding supplement, ENSURE ENLIVE, (ENSURE ENLIVE) LIQD Take 237 mLs by mouth 2 (two) times daily between meals. 237 mL 12 09/04/2016 at Unknown time  . ferrous sulfate 325 (65 FE) MG tablet Take 325 mg by mouth daily with breakfast.   09/04/2016 at Unknown time  . furosemide (LASIX) 40 MG tablet Take 40 mg by mouth daily.   09/04/2016 at Unknown time  . isosorbide dinitrate (ISORDIL) 20 MG tablet Take 40 mg by mouth 3 (three) times daily.    09/04/2016 at Unknown time  . mirabegron ER (MYRBETRIQ) 25 MG TB24 tablet Take 25 mg by mouth daily.   09/04/2016 at Unknown time  . mirtazapine (REMERON) 7.5 MG tablet Take 1 tablet by mouth daily.   09/04/2016 at Unknown time  . Multiple Vitamin (MULTIVITAMIN WITH MINERALS) TABS tablet Take 1 tablet by mouth daily.   09/04/2016 at Unknown time  . pantoprazole (PROTONIX) 40 MG tablet TAKE ONE TABLET   BY MOUTH   DAILY 30 tablet 11 09/04/2016 at Unknown time   Scheduled: . atorvastatin  80 mg Oral Daily  . carvedilol  3.125 mg Oral BID WC  . cefTRIAXone (ROCEPHIN)  IV  2 g Intravenous Q24H  . dolutegravir  50 mg Oral Daily  . dronabinol  2.5 mg Oral BID AC  . emtricitabine-tenofovir AF  1 tablet Oral Daily  . insulin aspart  0-9 Units Subcutaneous TID WC  . isosorbide  dinitrate  10 mg Oral TID  . mouth rinse  15 mL Mouth Rinse BID  . mirabegron ER  25 mg Oral Daily  . mirtazapine  7.5 mg Oral Daily  . pantoprazole  40 mg Oral Daily   Continuous:  [id number] **OR** acetaminophen, ondansetron **OR** ondansetron (ZOFRAN) IV  Assesment: He has multiple medical problems. He came to the hospital with acute kidney injury. This was likely related to dehydration from poor appetite. He has improved.  He has severe protein calorie malnutrition and this is probably related to his new diagnosis of HIV/AIDS  He has coronary disease but has not been having any chest pain  He has COPD which has been stable with no increase in shortness  of breath  He has congestive heart failure which is also been stable  He had rapid atrial fib here in the hospital but that responded to fluid bolus  He has previous history of stroke and still has some left-sided facial asymmetry  He has diabetes which has been well controlled  He has hypertension well controlled now.  He has alcoholism in remission Active Problems:   CKD (chronic kidney disease)   Diabetes mellitus type 2 in nonobese [facility])   Essential hypertension, benign   Hyperkalemia   CAD (coronary artery disease)   History of stroke   Anemia of chronic kidney failure   Severe malnutrition (HCC)   AKI (acute kidney injury) ([place])   Palliative care encounter   Goals of care, counseling/discussion   DNR (do not resuscitate) discussion   Protein-calorie malnutrition, severe   HIV disease ([place])   Encounter for hospice care discussion    Plan: Transfer back to skilled care facility today    LOS: 7 days   Pierson Vantol L 09/12/2016, 8:22 AM

## 2016-09-12 NOTE — Progress Notes (Deleted)
Patient currently hospitalized.

## 2016-09-12 NOTE — Care Management Important Message (Signed)
Important Message  Patient Details  Name: Keith Young MRN: [id number] Date of Birth: 1951-03-30   Medicare Important Message Given:  Yes    Sherald Barge, RN 09/12/2016, 10:04 AM

## 2016-09-12 NOTE — Progress Notes (Signed)
Patient is to be discharged back to [facility] (Room 148) and in stable condition. Patient and family aware of transfer to [facility]. Report called to [place] at [facility], prescriptions will be sent with the patient as well as out of facility DNR. Patient will be transported by staff.  Celestia Khat, RN

## 2016-09-12 NOTE — Progress Notes (Signed)
Daily Progress Note   Patient Name: Keith Young       Date: 09/12/2016 DOB: 23-Jun-1950  Age: 66 y.o. MRN#: [id number] Attending Physician: Sinda Du, MD Primary Care Physician: [name], MD Admit Date: 09/04/2016  Reason for Consultation/Follow-up: Establishing goals of care and Psychosocial/spiritual support  Subjective: Keith Young is resting quietly in bed. He makes but does not keep eye contact as I enter. He is subdued, and does not try to communicate. Present at bedside today is sister and caregiver for past 2.5 years, Keith Young. Keith Young states she understands that Keith Young is returning to [facility] as a resident later today. We talk about the addition of 2 medicines for HIV, and Marinol for appetite.  Keith Young talks with her brother, she tells him that he must "help himself". She encourages him to eat, and states that maybe he will be able to get up out of bed. I share with Keith Young my worries that Keith Young will continue to have decreased appetite even with this medication, continued frailty.  I remind her what we see as people near end of life; sleeping more, interacting less and eating less. I share that this is all normal and expected.  I share my worry for recurrent bacteremia, also poor nutrition even with the addition of Marinol, also dehydration and continued kidney damage (continues on thickened liquids). Keith Young states that some family members came up from [place] to visit Keith Young this weekend. She shares that they had lots of suggestions, but do not know the full story about his health. She goes further to state that some of them shared that they knew Keith Young was HIV-positive, (they thought she knew). Keith Young goes on to state that she was very shocked when she found out her  brother was HIV, and that she had been caring for him. She states she is being tested this week.  We briefly talk about the concept of allowing natural death, but continue to treat the treatable. Keith Young and I talk about returning to the hospital. I do not press her for answers at this time, but I encouraged her to consider her choices for her brother. If he gets dehydrated, with further kidney damage, if he has recurrent bacteria in his blood. I again share a picture of the chronic illness pathway, what is  normal and expected. PMT will follow Keith Young at [facility]  Length of Stay: 7  Current Medications: Scheduled Meds:  . atorvastatin  80 mg Oral Daily  . carvedilol  3.125 mg Oral BID WC  . cefTRIAXone (ROCEPHIN)  IV  2 g Intravenous Q24H  . dolutegravir  50 mg Oral Daily  . dronabinol  2.5 mg Oral BID AC  . emtricitabine-tenofovir AF  1 tablet Oral Daily  . insulin aspart  0-9 Units Subcutaneous TID WC  . isosorbide dinitrate  10 mg Oral TID  . mouth rinse  15 mL Mouth Rinse BID  . mirabegron ER  25 mg Oral Daily  . mirtazapine  7.5 mg Oral Daily  . pantoprazole  40 mg Oral Daily    Continuous Infusions:   PRN Meds: acetaminophen **OR** acetaminophen, ondansetron **OR** ondansetron (ZOFRAN) IV  Physical Exam  Constitutional: No distress.  Frail and thin, makes but does not keep eye contact  HENT:  Head: Normocephalic and atraumatic.  Severe temporal wasting  Cardiovascular: Normal rate and regular rhythm.   Pulmonary/Chest: Effort normal. No respiratory distress.  Abdominal: Soft. He exhibits no distension.  Musculoskeletal: He exhibits no edema.  thin, muscle wasting  Neurological: He is alert.  Skin: Skin is warm and dry.  Nursing note and vitals reviewed.           Vital Signs: BP (!) 148/62 (BP Location: Right Arm)   Pulse 75   Temp 98.5 F (36.9 C) (Oral)   Resp 16   Ht 5\' 7"  (1.702 m)   Wt 47.1 kg (103 lb 12.8 oz)   SpO2 100%   BMI 16.26 kg/m  SpO2: SpO2: 100  % O2 Device: O2 Device: Not Delivered O2 Flow Rate: O2 Flow Rate (L/min): 1 L/min  Intake/output summary:  Intake/Output Summary (Last 24 hours) at 09/12/16 1226 Last data filed at 09/12/16 1204  Gross per 24 hour  Intake              660 ml  Output             1800 ml  Net            -1140 ml   LBM: Last BM Date: 09/09/16 Baseline Weight: Weight: 49.9 kg (110 lb) Most recent weight: Weight: 47.1 kg (103 lb 12.8 oz)       Palliative Assessment/Data:    Flowsheet Rows     Most Recent Value  Intake Tab  Referral Department  Pulmonary  Unit at Time of Referral  Med/Surg Unit  Palliative Care Primary Diagnosis  Cardiac  Date Notified  09/05/16  Palliative Care Type  New Palliative care  Reason for referral  Clarify Goals of Care  Date of Admission  09/04/16  Date first seen by Palliative Care  09/05/16  # of days Palliative referral response time  0 Day(s)  # of days IP prior to Palliative referral  1  Clinical Assessment  Palliative Performance Scale Score  20%  Pain Max last 24 hours  Not able to report  Pain Min Last 24 hours  Not able to report  Dyspnea Max Last 24 Hours  Not able to report  Dyspnea Min Last 24 hours  Not able to report  Psychosocial & Spiritual Assessment  Palliative Care Outcomes  Patient/Family meeting held?  Yes  Who was at the meeting?  with brother in law, Keith Young via phone, face to face sched for 4/10 at [facility]  Provided psychosocial or spiritual support, Clarified goals of care  Patient/Family wishes: Interventions discontinued/not started   Mechanical Ventilation  Palliative Care follow-up planned  -- [Follow-up while at [facility]]      Patient Active Problem List   Diagnosis Date Noted  . COPD (chronic obstructive pulmonary disease) ([place]) 09/12/2016  . Bacteremia 09/12/2016  . Encounter for hospice care discussion   . Protein-calorie malnutrition, severe 09/06/2016  . HIV disease ([place]) 09/06/2016  . Palliative  care encounter   . Goals of care, counseling/discussion   . DNR (do not resuscitate) discussion   . AKI (acute kidney injury) ([place]) 09/04/2016  . Malnutrition of moderate degree 06/28/2016  . Altered mental status 06/26/2016  . CVA (cerebral vascular accident) ([place]) 06/26/2016  . Severe malnutrition ([place]) 06/26/2016  . HCAP (healthcare-associated pneumonia) 06/04/2016  . Mucosal abnormality of stomach   . Gastritis and gastroduodenitis 09/30/2015  . Heme positive stool 09/30/2015  . Pancytopenia ([place]) 09/30/2015  . Chest pain 04/28/2015  . Elevated troponin 04/28/2015  . ICD (implantable cardioverter-defibrillator) in place 01/22/2015  . Anemia 09/30/2014  . Gastric polyp   . Reflux esophagitis   . Elevated LFTs 03/26/2014  . Anemia in chronic kidney disease 03/26/2014  . Carotid bruit 12/18/2013  . Acute on chronic systolic heart failure ([place]) 10/24/2013  . CHF (congestive heart failure), NYHA class III ([place]) 10/23/2013  . Cough 10/23/2013  . CKD (chronic kidney disease) 10/23/2013  . Diabetes mellitus type 2 in nonobese ([place]) 10/23/2013  . Essential hypertension, benign 10/23/2013  . Hyperkalemia 10/23/2013  . CAD (coronary artery disease) 10/23/2013  . History of stroke 10/23/2013  . Anemia of chronic kidney failure 10/23/2013  . Other and unspecified hyperlipidemia 10/23/2013    Palliative Care Assessment & Plan   Patient Profile: 66 y.o.malewith past medical history of Stroke with right-sided weakness, PNCsince January 2018 (left a group home of 2 years due to being non-ambulatory), high blood pressure and high cholesterol, COPD, stage III chronic kidney disease with anemia of chronic disease with Aranespinjections Q2 weeks, chronic systolic CHF class III, alcoholism in remission,, status post AICDadmitted on 4/8/2018with Acute kidney injury on chronic kidney disease stage III. New diagnosis of HIV/AIDS with wasting.  Assessment: Acute kidney injury; no great  improvement in kidney function, continue testing. Family is considering focusing on comfort and dignity with hospice in place. 4/13 family has decided to continue with gentle medical treatment at this time.acute kidney injury superimposed on chronic.  Possibly secondary to ATN/prerenal syndrome.  His creatinine has returned to his baseline.  New diagnosis of HIV/AIDS wasting;per infectious disease, ARV started. Waiting on family for direction. 4/13 family has decided to continue with gentle medical treatment at this time.  Recommendations/Plan:  at this point, continue to treat the treatable but no extraordinary measures such as CPR or intubation. Family has decided to continue with gentle medical treatment.  Goals of Care and Additional Recommendations:  Limitations on Scope of Treatment: Continue to treat the treatable, but no extraordinary measures such as CPR or intubation  Code Status:    Code Status Orders        Start     Ordered   09/04/16 2301  Do not attempt resuscitation (DNR)  Continuous    Question Answer Comment  In the event of cardiac or respiratory ARREST Do not call a "code blue"   In the event of cardiac or respiratory ARREST Do not perform Intubation, CPR, defibrillation or ACLS   In the event of  cardiac or respiratory ARREST Use medication by any route, position, wound care, and other measures to relive pain and suffering. May use oxygen, suction and manual treatment of airway obstruction as needed for comfort.      09/04/16 2301    Code Status History    Date Active Date Inactive Code Status Order ID Comments User Context   06/26/2016 10:13 PM 06/30/2016  1:52 PM Full Code [id number]  Karmen Bongo, MD Inpatient   06/04/2016  2:43 AM 06/08/2016  2:50 PM DNR [id number]  Orvan Falconer, MD Inpatient   04/28/2015 11:29 PM 04/29/2015  4:17 PM Full Code [id number]  Oswald Hillock, MD Inpatient   02/06/2014 10:43 AM 02/06/2014  4:29 PM Full Code [id number]  Larey Dresser, MD Inpatient    10/23/2013  7:33 PM 10/28/2013  3:01 PM Full Code [id number]  Louellen Molder, MD Inpatient    Advance Directive Documentation     Most Recent Value  Type of Advance Directive  Out of facility DNR (pink MOST or yellow form)  Pre-existing out of facility DNR order (yellow form or pink MOST form)  Physician notified to receive inpatient order  "MOST" Form in Place?  -       Prognosis:   < 3 months would not be surprising based on 3 hospitalizations in the last 4 months, frailty, malnutrition, bedbound state, worsened by the new diagnosis of HIV/AIDS wasting.  Discharge Planning:  Return to [facility] as resident  Care plan was discussed with nursing staff, case manager, social worker, and Dr. Luan Pulling on next rounds.  Thank you for allowing the Palliative Medicine Team to assist in the care of this patient.   Time In: 1010 Time Out: 1045 Total Time 35 minutes Prolonged Time Billed  no       Greater than 50%  of this time was spent counseling and coordinating care related to the above assessment and plan.  Drue Novel, NP  Please contact Palliative Medicine Team phone at [phone] for questions and concerns.

## 2016-09-12 NOTE — Discharge Summary (Signed)
Physician Discharge Summary  Patient ID: Keith Young MRN: [id number] DOB/AGE: [birth year] 66 y.o. Primary Care Physician:Sherron Mummert L, MD Admit date: 09/04/2016 Discharge date: 09/12/2016    Discharge Diagnoses:   Active Problems:   CHF (congestive heart failure), NYHA class III (HCC)   CKD (chronic kidney disease)   Diabetes mellitus type 2 in nonobese [facility])   Essential hypertension, benign   Hyperkalemia   CAD (coronary artery disease)   History of stroke   Anemia of chronic kidney failure   Severe malnutrition (HCC)   AKI (acute kidney injury) ([place])   Palliative care encounter   Goals of care, counseling/discussion   DNR (do not resuscitate) discussion   Protein-calorie malnutrition, severe   HIV disease ([place])   Encounter for hospice care discussion   COPD (chronic obstructive pulmonary disease) ([place])   Bacteremia   Allergies as of 09/12/2016   No Known Allergies     Medication List    STOP taking these medications   furosemide 40 MG tablet Commonly known as:  LASIX   mirtazapine 7.5 MG tablet Commonly known as:  REMERON     TAKE these medications   acetaminophen 325 MG tablet Commonly known as:  TYLENOL Take 2 tablets (650 mg total) by mouth every 4 (four) hours as needed for mild pain (or temp > 37.5 C (99.5 F)).   aspirin 325 MG tablet Take 1 tablet (325 mg total) by mouth daily.   atorvastatin 80 MG tablet Commonly known as:  LIPITOR Take 80 mg by mouth daily.   carvedilol 3.125 MG tablet Commonly known as:  COREG Take 1 tablet (3.125 mg total) by mouth 2 (two) times daily with a meal. What changed:  medication strength  how much to take   cefUROXime 500 MG tablet Commonly known as:  CEFTIN Take 1 tablet (500 mg total) by mouth 2 (two) times daily with a meal.   digoxin 0.125 MG tablet Commonly known as:  LANOXIN TAKE 1/2 TABLET BY MOUTH DAILY   dolutegravir 50 MG tablet Commonly known as:  TIVICAY Take 1 tablet (50 mg total) by  mouth daily. Start taking on:  09/13/2016   dronabinol 2.5 MG capsule Commonly known as:  MARINOL Take 1 capsule (2.5 mg total) by mouth 2 (two) times daily before lunch and supper.   emtricitabine-tenofovir AF 200-25 MG tablet Commonly known as:  DESCOVY Take 1 tablet by mouth daily.   feeding supplement (ENSURE ENLIVE) Liqd Take 237 mLs by mouth 2 (two) times daily between meals.   ferrous sulfate 325 (65 FE) MG tablet Take 325 mg by mouth daily with breakfast.   isosorbide dinitrate 10 MG tablet Commonly known as:  ISORDIL Take 1 tablet (10 mg total) by mouth 3 (three) times daily. What changed:  medication strength  how much to take   multivitamin with minerals Tabs tablet Take 1 tablet by mouth daily.   MYRBETRIQ 25 MG Tb24 tablet Generic drug:  mirabegron ER Take 25 mg by mouth daily.   pantoprazole 40 MG tablet Commonly known as:  PROTONIX TAKE ONE TABLET   BY MOUTH   DAILY   Vitamin D3 2000 units Tabs Take 2,000 mg by mouth daily.       Discharged Condition:Improved    Consults: Infectious disease per telephone/nephrology/palliative care  Significant Diagnostic Studies: US Renal  Result Date: 09/05/2016 CLINICAL DATA:  Hematuria.  Acute kidney injury. EXAM: RENAL / URINARY TRACT ULTRASOUND COMPLETE COMPARISON:  Abdominal ultrasound 05/14/2014 FINDINGS: Right Kidney: Length: 9.6  cm. Echogenicity within normal limits. No hydronephrosis. 3.0 x 2.6 x 3.0 cm interpolar cyst. Left Kidney: Length: 9.4 cm. Echogenicity within normal limits. No hydronephrosis. 2.1 x 1.7 x 1.3 cm interpolar cyst. Both renal cysts are mildly larger than on the 2015 examination, although some of the apparent change likely reflects differences in measurement technique. Bladder: Nondistended. IMPRESSION: 1. No hydronephrosis. 2. Bilateral renal cysts. Electronically Signed   By: Logan Bores M.D.   On: 09/05/2016 09:27   Dg Chest Port 1 View  Result Date: 09/05/2016 CLINICAL DATA:   Fever, dehydration, acute kidney injury EXAM: PORTABLE CHEST 1 VIEW COMPARISON:  07/21/2016 FINDINGS: Cardiomediastinal silhouette is stable. Single lead cardiac pacemaker is unchanged in position. Status post median sternotomy. No infiltrate or pleural effusion. No pulmonary edema. IMPRESSION: No active disease. Electronically Signed   By: Lahoma Crocker M.D.   On: 09/05/2016 11:28   Dg Knee Complete 4 Views Right  Result Date: 08/31/2016 CLINICAL DATA:  Fall off bed. Right knee injury and pain. Initial encounter. EXAM: RIGHT KNEE - COMPLETE 4+ VIEW COMPARISON:  None. FINDINGS: No evidence of fracture, dislocation, or joint effusion. No evidence of knee joint arthropathy. Old calcified bone infarcts are seen involving the distal femur and proximal tibia. Extensive peripheral vascular calcification also noted. IMPRESSION: No acute findings. Extensive peripheral vascular calcification and old bone infarcts. Electronically Signed   By: Earle Gell M.D.   On: 08/31/2016 11:04    Lab Results: Basic Metabolic Panel:  Recent Labs  09/11/16 0917 09/12/16 0611  NA 144 140  K 4.6 4.3  CL 113* 112*  CO2 27 26  GLUCOSE 131* 79  BUN 41* 32*  CREATININE 1.81* 1.51*  CALCIUM 7.5* 7.2*  PHOS 2.6 3.1   Liver Function Tests:  Recent Labs  09/11/16 0917 09/12/16 0611  ALBUMIN 1.8* 1.8*     CBC:  Recent Labs  09/10/16 0632 09/12/16 0611  WBC 2.1* 2.4*  HGB 9.7* 10.1*  HCT 29.3* 31.0*  MCV 84.0 84.7  PLT 101* 104*    Recent Results (from the past 240 hour(s))  Culture, Urine     Status: Abnormal   Collection Time: 09/04/16  9:23 PM  Result Value Ref Range Status   Specimen Description URINE, CLEAN CATCH  Final   Special Requests NONE  Final   Culture MULTIPLE SPECIES PRESENT, SUGGEST RECOLLECTION (A)  Final   Report Status 09/06/2016 FINAL  Final  Culture, blood (Routine X 2) w Reflex to ID Panel     Status: Abnormal   Collection Time: 09/05/16 10:43 AM  Result Value Ref Range Status    Specimen Description BLOOD RIGHT ARM  Final   Special Requests   Final    BOTTLES DRAWN AEROBIC AND ANAEROBIC Blood Culture adequate volume   Culture  Setup Time   Final    Performed at [facility] AT 2230 ON 4.9.2018 BY [place],B IN BOTH AEROBIC AND ANAEROBIC BOTTLES Organism ID to follow CRITICAL RESULT CALLED TO, READ BACK BY AND VERIFIED WITH: LISA RUSSELL,RN @0417  09/06/16 MKELLY,MLT Performed at [facility], [place] [address], [place], [place] [address]    Culture ESCHERICHIA COLI (A)  Final   Report Status 09/08/2016 FINAL  Final   Organism ID, Bacteria ESCHERICHIA COLI  Final      Susceptibility   Escherichia coli - MIC*    AMPICILLIN >=32 RESISTANT Resistant     CEFAZOLIN <=4 SENSITIVE Sensitive     CEFEPIME <=1  SENSITIVE Sensitive     CEFTAZIDIME <=1 SENSITIVE Sensitive     CEFTRIAXONE <=1 SENSITIVE Sensitive     CIPROFLOXACIN <=0.25 SENSITIVE Sensitive     GENTAMICIN <=1 SENSITIVE Sensitive     IMIPENEM <=0.25 SENSITIVE Sensitive     TRIMETH/SULFA <=20 SENSITIVE Sensitive     AMPICILLIN/SULBACTAM 16 INTERMEDIATE Intermediate     PIP/TAZO <=4 SENSITIVE Sensitive     Extended ESBL NEGATIVE Sensitive     * ESCHERICHIA COLI  Blood Culture ID Panel (Reflexed)     Status: Abnormal   Collection Time: 09/05/16 10:43 AM  Result Value Ref Range Status   Enterococcus species NOT DETECTED NOT DETECTED Final   Listeria monocytogenes NOT DETECTED NOT DETECTED Final   Staphylococcus species NOT DETECTED NOT DETECTED Final   Staphylococcus aureus NOT DETECTED NOT DETECTED Final   Streptococcus species NOT DETECTED NOT DETECTED Final   Streptococcus agalactiae NOT DETECTED NOT DETECTED Final   Streptococcus pneumoniae NOT DETECTED NOT DETECTED Final   Streptococcus pyogenes NOT DETECTED NOT DETECTED Final   Acinetobacter baumannii NOT DETECTED NOT DETECTED Final   Enterobacteriaceae species DETECTED (A) NOT DETECTED Final    Comment:  Enterobacteriaceae represent a large family of gram-negative bacteria, not a single organism. CRITICAL RESULT CALLED TO, READ BACK BY AND VERIFIED WITH: LISA RUSSELL,RN @0417  09/06/16 MKELLY,MLT    Enterobacter cloacae complex NOT DETECTED NOT DETECTED Final   Escherichia coli DETECTED (A) NOT DETECTED Final    Comment: CRITICAL RESULT CALLED TO, READ BACK BY AND VERIFIED WITH: LISA RUSSELL,RN @0417  09/06/16 MKELLY,MLT    Klebsiella oxytoca NOT DETECTED NOT DETECTED Final   Klebsiella pneumoniae NOT DETECTED NOT DETECTED Final   Proteus species NOT DETECTED NOT DETECTED Final   Serratia marcescens NOT DETECTED NOT DETECTED Final   Carbapenem resistance NOT DETECTED NOT DETECTED Final   Haemophilus influenzae NOT DETECTED NOT DETECTED Final   Neisseria meningitidis NOT DETECTED NOT DETECTED Final   Pseudomonas aeruginosa NOT DETECTED NOT DETECTED Final   Candida albicans NOT DETECTED NOT DETECTED Final   Candida glabrata NOT DETECTED NOT DETECTED Final   Candida krusei NOT DETECTED NOT DETECTED Final   Candida parapsilosis NOT DETECTED NOT DETECTED Final   Candida tropicalis NOT DETECTED NOT DETECTED Final    Comment: Performed at [facility], [place] [address] [place], [place] [address]  Culture, blood (Routine X 2) w Reflex to ID Panel     Status: Abnormal   Collection Time: 09/05/16 10:47 AM  Result Value Ref Range Status   Specimen Description BLOOD RIGHT HAND  Final   Special Requests   Final    BOTTLES DRAWN AEROBIC AND ANAEROBIC Blood Culture adequate volume   Culture  Setup Time   Final    GRAM NEGATIVE RODS Performed at [facility] Gram Stain Report Called to,Read Back By and Verified With: RUSSELL,L AT 2230 ON 4.9.2018 BY ISLEY,B IN BOTH AEROBIC AND ANAEROBIC BOTTLES CRITICAL VALUE NOTED.  VALUE IS CONSISTENT WITH PREVIOUSLY REPORTED AND CALLED VALUE.    Culture (A)  Final    ESCHERICHIA COLI SUSCEPTIBILITIES PERFORMED ON PREVIOUS CULTURE WITHIN THE LAST 5  DAYS. Performed at [facility], [place] [address], [place],  [address]    Report Status 09/08/2016 FINAL  Final  MRSA PCR Screening     Status: None   Collection Time: 09/06/16  4:00 PM  Result Value Ref Range Status   MRSA by PCR NEGATIVE NEGATIVE Final    Comment:  The GeneXpert MRSA Assay (FDA approved for NASAL specimens only), is one component of a comprehensive MRSA colonization surveillance program. It is not intended to diagnose MRSA infection nor to guide or monitor treatment for MRSA infections.      Hospital Course: This is a 66 year old with multiple medical problems who would benefit his usual state of poor health at a skilled care facility. He had not been eating well and his blood pressure had been going down and his medications had been adjusted. He then had blood in his urine and was brought to the emergency room for evaluation of that. He was found to have acute on chronic renal failure. He had multiple other medical problems. He was screened for HIV and was positive and felt to have HIV AIDS and HIV AIDS wasting disease. Infectious disease started him on medications. Consultation with palliative care was undertaken because of his other multiple medical problems and his family felt that he would want to go ahead and be treated and treat what was treatable but would not like to be resuscitated. Nephrology consultation was obtained and eventually his renal function returned to baseline. He had Escherichia coli bacteremia that was treated. He was started on Marinol and his appetite improved. He is on anti-retroviral therapy.  Discharge Exam: Blood pressure (!) 148/62, pulse 75, temperature 98.5 F (36.9 C), temperature source Oral, resp. rate 16, height 5\' 7"  (1.702 m), weight 47.1 kg (103 lb 12.8 oz), SpO2 100 %. He is more alert. He is very thin. His chest is clear. His heart is regular. No edema.  Disposition: Return to the skilled care facility. He will  have anti-retroviral therapy and will follow up with infectious disease. He will need to have basic metabolic profile in 3 days. He will continue on Ceftin 500 mg twice a day for 3 more days. He will have PT OT and speech as needed.  Discharge Instructions    Diet - low sodium heart healthy    Complete by:  As directed    Increase activity slowly    Complete by:  As directed         Signed: Zakai Gonyea L   09/12/2016, 8:39 AM

## 2016-09-12 NOTE — Progress Notes (Addendum)
LCSW following for disposition:  Patient returning to [facility] today.  Patient medically stable for discharge. Plan return to [facility], family and patient agreeable, sister at bedside, going to work with Palliative care prior to discharge. Patient to be transported by hospital staff.  No other needs at this time Return to SNF.  Lane Hacker, MSW Clinical Social Work: [employer] Coverage for :  [phone]

## 2016-09-14 LAB — HLA B*5701: HLA B 5701: NEGATIVE

## 2016-09-15 ENCOUNTER — Encounter (HOSPITAL_COMMUNITY)
Admission: RE | Admit: 2016-09-15 | Discharge: 2016-09-15 | Disposition: A | Discharge status: Home / Self Care | Payer: Medicare HMO | Source: Skilled Nursing Facility | Attending: Pulmonary Disease | Admitting: Pulmonary Disease

## 2016-09-15 DIAGNOSIS — I639 Cerebral infarction, unspecified: Secondary | ICD-10-CM | POA: Insufficient documentation

## 2016-09-15 DIAGNOSIS — I5022 Chronic systolic (congestive) heart failure: Secondary | ICD-10-CM | POA: Insufficient documentation

## 2016-09-15 DIAGNOSIS — R319 Hematuria, unspecified: Secondary | ICD-10-CM | POA: Diagnosis present

## 2016-09-15 DIAGNOSIS — J449 Chronic obstructive pulmonary disease, unspecified: Secondary | ICD-10-CM | POA: Insufficient documentation

## 2016-09-15 DIAGNOSIS — R1312 Dysphagia, oropharyngeal phase: Secondary | ICD-10-CM | POA: Insufficient documentation

## 2016-09-15 LAB — BASIC METABOLIC PANEL
ANION GAP: 4 — AB (ref 5–15)
BUN: 23 mg/dL — ABNORMAL HIGH (ref 6–20)
CALCIUM: 7.8 mg/dL — AB (ref 8.9–10.3)
CO2: 23 mmol/L (ref 22–32)
CREATININE: 1.45 mg/dL — AB (ref 0.61–1.24)
Chloride: 108 mmol/L (ref 101–111)
GFR, EST AFRICAN AMERICAN: 57 mL/min — AB (ref >60)
GFR, EST NON AFRICAN AMERICAN: 49 mL/min — AB (ref >60)
GLUCOSE: 116 mg/dL — AB (ref 65–99)
Potassium: 4.5 mmol/L (ref 3.5–5.1)
Sodium: 135 mmol/L (ref 135–145)

## 2016-09-19 ENCOUNTER — Ambulatory Visit (INDEPENDENT_AMBULATORY_CARE_PROVIDER_SITE_OTHER): Payer: Medicare HMO | Admitting: Pharmacist Clinician (PhC)/ Clinical Pharmacy Specialist

## 2016-09-19 ENCOUNTER — Telehealth: Payer: Self-pay

## 2016-09-19 DIAGNOSIS — Z23 Encounter for immunization: Secondary | ICD-10-CM | POA: Diagnosis not present

## 2016-09-19 DIAGNOSIS — B20 Human immunodeficiency virus [HIV] disease: Secondary | ICD-10-CM | POA: Diagnosis not present

## 2016-09-19 LAB — REFLEX TO GENOSURE(R) MG
HIV GENOSURE(R) MG PDF: UNDETERMINED
HIV GenoSure(R): UNDETERMINED
HIV GenoSure: UNDETERMINED

## 2016-09-19 MED ORDER — ATOVAQUONE 750 MG/5ML PO SUSP: 1500.0000 mg | Freq: Every day | ORAL | 5 refills | Status: DC

## 2016-09-19 NOTE — H&P (Signed)
NAME:  Keith Young, Keith Young                    ACCOUNT NO.:  MEDICAL RECORD NO.:  [id number]  LOCATION:                                 FACILITY:  PHYSICIAN:  Kengo Sturges L. Luan Pulling, M.D.DATE OF BIRTH:  09-04-1950  DATE OF ADMISSION: DATE OF DISCHARGE:  LH                             HISTORY & PHYSICAL   The patient is at the [facility]  HISTORY OF PRESENT ILLNESS:  [name] has been admitted back at the skilled care facility after being in the hospital.  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  He had HIV/AIDS wasting and he is doing better with that.  He is on Marinol and eating better.  Additionally to that, he has a history of congestive heart failure which seems pretty stable, chronic kidney disease which was stable when checked earlier this week, diabetes which is doing well based on the numbers from the nursing home, hypertension, history of hyperkalemia but his potassium level is okay, coronary artery occlusive disease but he is not having any chest pain, history of stroke with no new neurological findings, severe malnutrition which I think is getting better, COPD which is stable and he had bacteremia.  He has been having trouble with his blood pressure prior to his last admission and he has had changes in the number of his medications.  He is on antiretroviral therapy and he is scheduled to go to the Infectious Disease Clinic tomorrow, I believe.  He denies any chest pain, nausea, vomiting, diarrhea, abdominal pain, cough, congestion, PND, or orthopnea.  PAST MEDICAL HISTORY:  He has a past medical history as mentioned above, congestive heart failure, diabetes, hypertension, coronary artery disease, stroke, malnutrition, new diagnosis of HIV/AIDS with AIDS wasting, and COPD.  I reviewed the medications in the [facility] and they appeared to be appropriate and they are what I ordered when he left the hospital.  FAMILY HISTORY:   Positive for hypertension and diabetes in multiple family members.  SOCIAL HISTORY:  He has lived at the skilled care facility for a period of about 2-3 months now.  He had been in an assisted living facility prior to that.  He is an ex-cigarette smoker, having smoked about 30- pack years.  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  REVIEW OF SYSTEMS:  Ten-point review of systems except as mentioned is negative.  PHYSICAL EXAMINATION:  CONSTITUTIONAL:  He is awake and alert and in no acute distress.  He is sitting in a chair.  He does not look as malnourished as he did earlier. EYES:  Pupils react.  EOMI. EARS, NOSE, MOUTH, AND THROAT:  His mucous membranes are moist.  He has some facial asymmetry from his stroke.  Hearing is grossly normal. CARDIOVASCULAR:  His heart is regular with no gallop. EXTREMITIES:  His extremities showed no edema. RESPIRATORY:  Respiratory effort is normal with normal breath sounds. GASTROINTESTINAL:  His abdomen is soft with no masses.  Bowel sounds are present and active. MUSCULOSKELETAL:  He is still weak, but he is able to move all 4 extremities  and he can stand. NEUROLOGICAL:  He has some mild left facial asymmetry. PSYCHIATRIC:  Normal mood and affect.  ASSESSMENT:  He is still undergoing rehab.  His renal function has improved and was checked earlier this week.  His HIV/AIDS is being treated and he is going to go to the Infectious Disease Clinic.  His blood pressure is better.  His medications have been adjusted.  I do not plan to change anything else at this time.     Curtisha Bendix L. Luan Pulling, M.D.     ELH/MEDQ  D:  09/18/2016  T:  09/18/2016  Job:  [id number]

## 2016-09-19 NOTE — Telephone Encounter (Signed)
Error

## 2016-09-19 NOTE — Progress Notes (Addendum)
HPI: Keith Young is a 66 y.o. male CKD and newly diagnosed HIV.  Allergies: No Known Allergies  IMZ Dose: Dose: Date:  HAV 09/19/16    HBV 09/19/16    Meningitis N/A N/A   Pneumovax 09/19/16    Tdap       Past Medical History: Past Medical History:  Diagnosis Date  . AICD (automatic cardioverter/defibrillator) present   . Alcoholism in remission ([place])   . Anemia   . Aortic insufficiency   . Arthritis   . At moderate risk for fall   . Bell's palsy   . Cardiomyopathy ([place])   . Chronic systolic heart failure (HCC)    NYHA Class III  . CKD (chronic kidney disease) stage 3, GFR 30-59 ml/min   . COPD (chronic obstructive pulmonary disease) ([place])   . Essential hypertension, benign   . HIV disease ([place]) 09/06/2016  . Hyperlipidemia   . Noncompliance with medication regimen   . Nonischemic cardiomyopathy (HCC)    EF 15-20%  . NSVT (nonsustained ventricular tachycardia) ([place])   . Numbness of right jaw    Had a stoke in 01/2013. Numbness is occasional, especially when trying to chew.  . Productive cough 08/2013   With brown sputum   . SOB (shortness of breath) on exertion 08/2013  . Stroke ([place]) 01/2013   weakness of right side from CVA  . Type 2 diabetes mellitus ([place])   . Uses hearing aid 2014   recently received new hearing aids    Social History: Social History   Social History  . Marital status: Single    Spouse name: N/A  . Number of children: N/A  . Years of education: N/A   Occupational History  . disabled    Social History Main Topics  . Smoking status: Former Smoker    Types: Cigarettes    Quit date: 11/05/1993  . Smokeless tobacco: Never Used  . Alcohol use Yes     Comment: former heavy ETOH, none recently  . Drug use: No     Comment: prior history of cocaine use, smoking   . Sexual activity: Yes    Birth control/ protection: None   Other Topics Concern  . Not on file   Social History Narrative   Recently moved from [place] to be with family in  [place] (2015).  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  Has h/o polysubstance abuse.  Baseline - able to ambulate, mild cognitive delay, able to toilet and shower himself, occasional use of walker/cane.                Previous Regimen: None  Current Regimen: Tivicay/Descovy  Labs: Hepatitis B Surface Ag (no units)  Date Value  09/07/2016 Negative   HCV Ab (s/co ratio)  Date Value  08/17/2016 <0.1    CrCl: CrCl cannot be calculated (Unknown ideal weight.).  Lipids:    Component Value Date/Time   CHOL 78 06/27/2016 0655   TRIG 110 06/27/2016 0655   HDL 13 (L) 06/27/2016 0655   CHOLHDL 6.0 06/27/2016 0655   VLDL 22 06/27/2016 0655   LDLCALC 43 06/27/2016 0655    Assessment: Keith Young presents from a nursing home with his sister for his intake visit. He was recently diagnosed with HIV while admitted for AoCKD with SCr up to 4.7 (now 1.45). He currently resides at [facility] in [place], [place]. He was started on Tivicay and Descovy inpatient, and the nursing home  is still administering this per his chart. His CD4 is 40 and his HIV VL/genotype is still pending (collected <7 days ago). The HLA B*5701 is negative. We will modify his regimen if necessary, but he is currently on appropriate therapy with a high barrier to resistance. We will get VL at the next visit since it's soon since the start date today.   Notably, Keith Young has had HIV for a while (unknown amount of time) but did not disclose this to anyone outside of his nephew in [place]. His sister is visibly upset because 'he put the family at risk.' She states that Keith Young most likely contracted HIV from an old girlfriend that died (did not say when any of this happened). She also said that Keith Young has had a girlfriend since who is now married. His brother has contacted the wife to let her know that she (and her contacts) need testing. The rest of Keith Young's family resides in [place], along with  his son who may or may not know yet (he has not called anyone back to discuss his diagnosis).   Of note, his hospital course was complicated and palliative care was being considered; however, patient has improved significantly, and the sister wants to pursue aggressive care if the goal is improvement. We informed her that continued care would most likely help him improve and so she wishes to continue. As such, his CD4 is <200 so he meets criteria for OI PPx. Since he has baseline renal dysfunction and recent AKI, as well as no record of G6PD, we will start on atovaquone. We called the nursing home and they use [facility] in [place], so we will send his Atovaquone Rx there to be filled and delivered.  Regarding co-infections/immunity, his HAV, HBV, HCV, and RPR serologies were all negative. There is also no record of any other immunizations, outside of influenza. As such, we will administer HAV, HBV, and PPSV23 today.   Dr Tommy Medal did the consult for him while he was at [facility]. However, he doesn't have any open slots in May so Dr. Linus Salmons will see him. He should follow-up with Dr. Linus Salmons in 1 month.   Recommendations: G6PD today Cont DTG/Descovy Start Atovaquone 1571m daily Follow-up with HIV RNA from inpatient labs RTC 10/18/16 with comer Atovaquone 15074mdaily - [facility]  JoStark KleinPharmD Clinical Pharmacy Resident [phone]) 09/19/2016 11:29 AM    Agreed with Joe's note. Checked with [facility] today and his VL and genotype should be back by Friday.   MiOnnie BoerPharmD, BCPS, AAHIVP, CPP Infectious Disease Pharmacist Pager: [phone]/23/2018 4:07 PM

## 2016-09-19 NOTE — Patient Instructions (Addendum)
Come back and see Dr. Linus Salmons on 10/18/16 at 1000 Start your atovaquone 1500mg  daily today Continue your HIV medications

## 2016-09-20 ENCOUNTER — Telehealth: Payer: Self-pay | Admitting: *Deleted

## 2016-09-20 LAB — GLUCOSE 6 PHOSPHATE DEHYDROGENASE: G-6PDH: 23.7 U/g Hgb — ABNORMAL HIGH (ref 7.0–20.5)

## 2016-09-20 NOTE — Telephone Encounter (Signed)
PA for Atovaquone completed and faxed back

## 2016-09-22 LAB — HIV-1 RNA ULTRAQUANT REFLEX TO GENTYP+
HIV-1 RNA BY PCR: 105000 copies/mL
HIV-1 RNA QUANT, LOG: 5.021 {Log_copies}/mL

## 2016-09-22 NOTE — Telephone Encounter (Signed)
PA approved through 05/29/2017

## 2016-09-26 ENCOUNTER — Encounter (HOSPITAL_COMMUNITY): Payer: Medicare HMO

## 2016-09-26 ENCOUNTER — Encounter (HOSPITAL_BASED_OUTPATIENT_CLINIC_OR_DEPARTMENT_OTHER): Payer: Medicare HMO

## 2016-09-26 VITALS — BP 112/64 | HR 70 | Temp 98.1°F | Resp 16

## 2016-09-26 DIAGNOSIS — D631 Anemia in chronic kidney disease: Secondary | ICD-10-CM | POA: Diagnosis not present

## 2016-09-26 DIAGNOSIS — N189 Chronic kidney disease, unspecified: Secondary | ICD-10-CM

## 2016-09-26 DIAGNOSIS — N183 Chronic kidney disease, stage 3 unspecified: Secondary | ICD-10-CM

## 2016-09-26 DIAGNOSIS — D61818 Other pancytopenia: Secondary | ICD-10-CM

## 2016-09-26 LAB — CBC WITH DIFFERENTIAL/PLATELET
BASOS ABS: 0.1 10*3/uL (ref 0.0–0.1)
Basophils Relative: 2 %
EOS ABS: 0.1 10*3/uL (ref 0.0–0.7)
EOS PCT: 4 %
HEMATOCRIT: 29.2 % — AB (ref 39.0–52.0)
Hemoglobin: 9.4 g/dL — ABNORMAL LOW (ref 13.0–17.0)
LYMPHS PCT: 37 %
Lymphs Abs: 0.9 10*3/uL (ref 0.7–4.0)
MCH: 27.7 pg (ref 26.0–34.0)
MCHC: 32.2 g/dL (ref 30.0–36.0)
MCV: 86.1 fL (ref 78.0–100.0)
MONO ABS: 0.4 10*3/uL (ref 0.1–1.0)
Monocytes Relative: 17 %
Neutro Abs: 0.9 10*3/uL — ABNORMAL LOW (ref 1.7–7.7)
Neutrophils Relative %: 40 %
PLATELETS: 210 10*3/uL (ref 150–400)
RBC: 3.39 MIL/uL — AB (ref 4.22–5.81)
RDW: 15.8 % — AB (ref 11.5–15.5)
WBC: 2.3 10*3/uL — AB (ref 4.0–10.5)

## 2016-09-26 MED ORDER — DARBEPOETIN ALFA 60 MCG/0.3ML IJ SOSY
50.0000 ug | PREFILLED_SYRINGE | Freq: Once | INTRAMUSCULAR | Status: AC
  Administered 2016-09-26: 50 ug via SUBCUTANEOUS

## 2016-09-26 MED ORDER — DARBEPOETIN ALFA 60 MCG/0.3ML IJ SOSY
PREFILLED_SYRINGE | INTRAMUSCULAR | Status: AC
  Filled 2016-09-26: qty 0.3

## 2016-09-26 NOTE — Progress Notes (Signed)
Patient from [facility]. Unable to reconcile medications, nursing home [facility] not brought to visit and patient unable to verbalize medications. Keith Young presents today for injection per MD orders. Aranesp 50 mcg administered SQ in left Abdomen. Administration without incident. Patient tolerated well. Stable on discharge with caregiver via wheelchair.

## 2016-09-26 NOTE — Patient Instructions (Signed)
[place] at [facility] Discharge Instructions  RECOMMENDATIONS MADE BY THE CONSULTANT AND ANY TEST RESULTS WILL BE SENT TO YOUR REFERRING PHYSICIAN.  Hemoglobin 9.4. Aranesp 50 mcg injection given as ordered. Return as scheduled.  Thank you for choosing [place] at [facility] to provide your oncology and hematology care.  To afford each patient quality time with our provider, please arrive at least 15 minutes before your scheduled appointment time.    If you have a lab appointment with the [place] [facility] please come in thru the  Main Entrance and check in at the main information desk  You need to re-schedule your appointment should you arrive 10 or more minutes late.  We strive to give you quality time with our providers, and arriving late affects you and other patients whose appointments are after yours.  Also, if you no show three or more times for appointments you may be dismissed from the clinic at the providers discretion.     Again, thank you for choosing [facility].  Our hope is that these requests will decrease the amount of time that you wait before being seen by our physicians.       _____________________________________________________________  Should you have questions after your visit to [facility], please contact our office at [phone] between the hours of 8:30 a.m. and 4:30 p.m.  Voicemails left after 4:30 p.m. will not be returned until the following business day.  For prescription refill requests, have your pharmacy contact our office.       Resources For Cancer Patients and their Caregivers ? American Cancer Society: Can assist with transportation, wigs, general needs, runs Look Good Feel Better.        (407)447-0049 ? Cancer Care: Provides financial assistance, online support groups, medication/co-pay assistance.  1-800-813-HOPE 548-880-4578) ? [place] Assists  [place] cancer patients and their families through emotional , educational and financial support.  904 035 0833 ? [place] DSS Where to apply for food stamps, Medicaid and utility assistance. 636-787-8781 ? RCATS: Transportation to medical appointments. (845)397-0962 ? Social Security Administration: May apply for disability if have a Stage IV cancer. 6808109588 765 748 5266 ? [employer], Disability and Transit Services: Assists with nutrition, care and transit needs. [place] Support Programs: @10RELATIVEDAYS @ > Cancer Support Group  2nd Tuesday of the month 1pm-2pm, Journey Room  > Creative Journey  3rd Tuesday of the month 1130am-1pm, Journey Room  > Look Good Feel Better  1st Wednesday of the month 10am-12 noon, Journey Room (Call [place] to register [phone])

## 2016-09-27 LAB — REFLEX TO GENOSURE(R) MG

## 2016-09-29 ENCOUNTER — Encounter (HOSPITAL_COMMUNITY)
Admission: RE | Admit: 2016-09-29 | Discharge: 2016-09-29 | Disposition: A | Discharge status: Home / Self Care | Payer: Medicare HMO | Source: Skilled Nursing Facility | Attending: Pulmonary Disease | Admitting: Pulmonary Disease

## 2016-09-29 ENCOUNTER — Encounter: Payer: Self-pay | Admitting: *Deleted

## 2016-09-29 DIAGNOSIS — J449 Chronic obstructive pulmonary disease, unspecified: Secondary | ICD-10-CM | POA: Diagnosis present

## 2016-09-29 DIAGNOSIS — I5022 Chronic systolic (congestive) heart failure: Secondary | ICD-10-CM | POA: Insufficient documentation

## 2016-09-29 DIAGNOSIS — I639 Cerebral infarction, unspecified: Secondary | ICD-10-CM | POA: Insufficient documentation

## 2016-09-29 DIAGNOSIS — N3281 Overactive bladder: Secondary | ICD-10-CM | POA: Insufficient documentation

## 2016-09-29 DIAGNOSIS — R7881 Bacteremia: Secondary | ICD-10-CM | POA: Diagnosis present

## 2016-09-29 DIAGNOSIS — I480 Paroxysmal atrial fibrillation: Secondary | ICD-10-CM | POA: Insufficient documentation

## 2016-09-29 LAB — DIGOXIN LEVEL: DIGOXIN LVL: 0.3 ng/mL — AB (ref 0.8–2.0)

## 2016-10-10 ENCOUNTER — Encounter (HOSPITAL_BASED_OUTPATIENT_CLINIC_OR_DEPARTMENT_OTHER): Payer: Medicare HMO

## 2016-10-10 ENCOUNTER — Encounter (HOSPITAL_COMMUNITY): Payer: Medicare HMO | Attending: Oncology | Admitting: Oncology

## 2016-10-10 ENCOUNTER — Encounter (HOSPITAL_COMMUNITY): Payer: Self-pay

## 2016-10-10 ENCOUNTER — Encounter (HOSPITAL_COMMUNITY): Payer: Medicare HMO

## 2016-10-10 VITALS — BP 100/58 | HR 73 | Temp 97.7°F | Resp 16

## 2016-10-10 DIAGNOSIS — D638 Anemia in other chronic diseases classified elsewhere: Secondary | ICD-10-CM | POA: Diagnosis not present

## 2016-10-10 DIAGNOSIS — I472 Ventricular tachycardia: Secondary | ICD-10-CM | POA: Insufficient documentation

## 2016-10-10 DIAGNOSIS — D631 Anemia in chronic kidney disease: Secondary | ICD-10-CM

## 2016-10-10 DIAGNOSIS — K210 Gastro-esophageal reflux disease with esophagitis: Secondary | ICD-10-CM | POA: Diagnosis not present

## 2016-10-10 DIAGNOSIS — Z8673 Personal history of transient ischemic attack (TIA), and cerebral infarction without residual deficits: Secondary | ICD-10-CM | POA: Insufficient documentation

## 2016-10-10 DIAGNOSIS — I429 Cardiomyopathy, unspecified: Secondary | ICD-10-CM | POA: Diagnosis not present

## 2016-10-10 DIAGNOSIS — N189 Chronic kidney disease, unspecified: Secondary | ICD-10-CM

## 2016-10-10 DIAGNOSIS — Z95810 Presence of automatic (implantable) cardiac defibrillator: Secondary | ICD-10-CM | POA: Diagnosis not present

## 2016-10-10 DIAGNOSIS — I130 Hypertensive heart and chronic kidney disease with heart failure and stage 1 through stage 4 chronic kidney disease, or unspecified chronic kidney disease: Secondary | ICD-10-CM | POA: Insufficient documentation

## 2016-10-10 DIAGNOSIS — J449 Chronic obstructive pulmonary disease, unspecified: Secondary | ICD-10-CM | POA: Insufficient documentation

## 2016-10-10 DIAGNOSIS — Z87891 Personal history of nicotine dependence: Secondary | ICD-10-CM | POA: Diagnosis not present

## 2016-10-10 DIAGNOSIS — Z950 Presence of cardiac pacemaker: Secondary | ICD-10-CM | POA: Diagnosis not present

## 2016-10-10 DIAGNOSIS — D509 Iron deficiency anemia, unspecified: Secondary | ICD-10-CM

## 2016-10-10 DIAGNOSIS — I509 Heart failure, unspecified: Secondary | ICD-10-CM | POA: Diagnosis not present

## 2016-10-10 DIAGNOSIS — Z7982 Long term (current) use of aspirin: Secondary | ICD-10-CM | POA: Diagnosis not present

## 2016-10-10 DIAGNOSIS — Z9114 Patient's other noncompliance with medication regimen: Secondary | ICD-10-CM | POA: Insufficient documentation

## 2016-10-10 DIAGNOSIS — E1122 Type 2 diabetes mellitus with diabetic chronic kidney disease: Secondary | ICD-10-CM | POA: Diagnosis not present

## 2016-10-10 DIAGNOSIS — N281 Cyst of kidney, acquired: Secondary | ICD-10-CM | POA: Diagnosis not present

## 2016-10-10 DIAGNOSIS — N183 Chronic kidney disease, stage 3 (moderate): Principal | ICD-10-CM

## 2016-10-10 DIAGNOSIS — D61818 Other pancytopenia: Secondary | ICD-10-CM

## 2016-10-10 DIAGNOSIS — E785 Hyperlipidemia, unspecified: Secondary | ICD-10-CM | POA: Insufficient documentation

## 2016-10-10 LAB — CBC WITH DIFFERENTIAL/PLATELET
Basophils Absolute: 0 10*3/uL (ref 0.0–0.1)
Basophils Relative: 2 %
EOS ABS: 0.2 10*3/uL (ref 0.0–0.7)
EOS PCT: 6 %
HCT: 30 % — ABNORMAL LOW (ref 39.0–52.0)
Hemoglobin: 9.4 g/dL — ABNORMAL LOW (ref 13.0–17.0)
LYMPHS ABS: 1.1 10*3/uL (ref 0.7–4.0)
LYMPHS PCT: 43 %
MCH: 27.6 pg (ref 26.0–34.0)
MCHC: 31.3 g/dL (ref 30.0–36.0)
MCV: 88 fL (ref 78.0–100.0)
Monocytes Absolute: 0.5 10*3/uL (ref 0.1–1.0)
Monocytes Relative: 17 %
Neutro Abs: 0.9 10*3/uL — ABNORMAL LOW (ref 1.7–7.7)
Neutrophils Relative %: 33 %
PLATELETS: 187 10*3/uL (ref 150–400)
RBC: 3.41 MIL/uL — ABNORMAL LOW (ref 4.22–5.81)
RDW: 15.9 % — AB (ref 11.5–15.5)
WBC: 2.7 10*3/uL — AB (ref 4.0–10.5)

## 2016-10-10 MED ORDER — DARBEPOETIN ALFA 60 MCG/0.3ML IJ SOSY
PREFILLED_SYRINGE | INTRAMUSCULAR | Status: AC
  Filled 2016-10-10: qty 0.3

## 2016-10-10 MED ORDER — DARBEPOETIN ALFA 60 MCG/0.3ML IJ SOSY
50.0000 ug | PREFILLED_SYRINGE | Freq: Once | INTRAMUSCULAR | Status: AC
  Administered 2016-10-10: 50 ug via SUBCUTANEOUS

## 2016-10-10 NOTE — Progress Notes (Signed)
Keith Young presents today for injection per MD orders. Aranesp 50 mcg administered SQ in right Abdomen. Administration without incident. Patient tolerated well. Stable on discharge back to nursing home with caregiver via wheelchair.

## 2016-10-10 NOTE — Patient Instructions (Signed)
[place] at [facility] Discharge Instructions  RECOMMENDATIONS MADE BY THE CONSULTANT AND ANY TEST RESULTS WILL BE SENT TO YOUR REFERRING PHYSICIAN.  Hemoglobin 9.4. Aranesp 50 mcg injection given as ordered. Return as scheduled.  Thank you for choosing [place] at [facility] to provide your oncology and hematology care.  To afford each patient quality time with our provider, please arrive at least 15 minutes before your scheduled appointment time.    If you have a lab appointment with the [place] please come in thru the  Main Entrance and check in at the main information desk  You need to re-schedule your appointment should you arrive 10 or more minutes late.  We strive to give you quality time with our providers, and arriving late affects you and other patients whose appointments are after yours.  Also, if you no show three or more times for appointments you may be dismissed from the clinic at the providers discretion.     Again, thank you for choosing [facility].  Our hope is that these requests will decrease the amount of time that you wait before being seen by our physicians.       _____________________________________________________________  Should you have questions after your visit to [facility], please contact our office at [phone] between the hours of 8:30 a.m. and 4:30 p.m.  Voicemails left after 4:30 p.m. will not be returned until the following business day.  For prescription refill requests, have your pharmacy contact our office.       Resources For Cancer Patients and their Caregivers ? American Cancer Society: Can assist with transportation, wigs, general needs, runs Look Good Feel Better.        (351)593-5993 ? Cancer Care: Provides financial assistance, online support groups, medication/co-pay assistance.  1-800-813-HOPE 870-059-5355) ? [place] Assists  [place] cancer patients and their families through emotional , educational and financial support.  (559) 315-3076 ? [place] DSS Where to apply for food stamps, Medicaid and utility assistance. 3068604414 ? RCATS: Transportation to medical appointments. 252 816 7221 ? Social Security Administration: May apply for disability if have a Stage IV cancer. (626)479-3257 (267)646-6476 ? [employer], Disability and Transit Services: Assists with nutrition, care and transit needs. [place] Support Programs: @10RELATIVEDAYS @ > Cancer Support Group  2nd Tuesday of the month 1pm-2pm, Journey Room  > Creative Journey  3rd Tuesday of the month 1130am-1pm, Journey Room  > Look Good Feel Better  1st Wednesday of the month 10am-12 noon, Journey Room (Call [place] to register [phone])

## 2016-10-10 NOTE — Progress Notes (Signed)
[place],  [address]   CLINIC:  Medical Oncology/Hematology Progress Note  PCP:  Sinda Du, [place] [address] [phone]   REASON FOR VISIT:  Follow-up for anemia in setting of CKD  CURRENT THERAPY: Aranesp 50 mcg every 2 weeks   HISTORY OF PRESENT ILLNESS:  (From Keith Crigler, PA-C's last note on 04/11/16)     INTERVAL HISTORY:  Keith Young returns for follow-up for anemia, in the setting of Stage III-IV chronic kidney disease.   Since his last visit to the cancer center he has had several hospital visits. From 06/03/16-06/08/16, he was admitted for shortness of breath, cough, and congestion; he was found to have HCAP.  From 06/26/16-06/30/16, Keith Young was admitted for altered mental status secondary to "new stroke." He was discharged to skilled nursing facility at [facility], where he currently resides for rehabilitation.  (Of note, prior to this last admission, he was living at an assisted living facility).   Patient also with history of CHF, with EF 15-20% and history of non-sustained V-tach with placement of AICD.   He is seen today unaccompanied in exam room seated in wheelchair for continuing follow up. I personally reviewed and went over imaging results and labs with the patient.     He is largely without complaints today. Reports that his energy levels and appetite are good. Denies abdominal pain. He tells me he has been eating well.  Patient has no other questions or concerns at this time.   REVIEW OF SYSTEMS:  Review of Systems  Constitutional: Negative.  Negative for appetite change.       Eating well  HENT:  Negative.   Eyes: Negative.   Respiratory: Negative.   Cardiovascular: Negative.  Negative for chest pain.  Gastrointestinal: Negative.  Negative for abdominal pain.  Endocrine: Negative.   Genitourinary: Negative.    Musculoskeletal: Negative.   Skin:  Negative.   Neurological: Negative.   Hematological: Negative.   Psychiatric/Behavioral: Negative.   All other systems reviewed and are negative.    PAST MEDICAL/SURGICAL HISTORY:  Past Medical History:  Diagnosis Date  . AICD (automatic cardioverter/defibrillator) present   . Alcoholism in remission ([place])   . Anemia   . Aortic insufficiency   . Arthritis   . At moderate risk for fall   . Bell's palsy   . Cardiomyopathy ([place])   . Chronic systolic heart failure (HCC)    NYHA Class III  . CKD (chronic kidney disease) stage 3, GFR 30-59 ml/min   . COPD (chronic obstructive pulmonary disease) ([place])   . Essential hypertension, benign   . HIV disease ([place]) 09/06/2016  . Hyperlipidemia   . Noncompliance with medication regimen   . Nonischemic cardiomyopathy (HCC)    EF 15-20%  . NSVT (nonsustained ventricular tachycardia) ([place])   . Numbness of right jaw    Had a stoke in 01/2013. Numbness is occasional, especially when trying to chew.  . Productive cough 08/2013   With brown sputum   . SOB (shortness of breath) on exertion 08/2013  . Stroke ([place]) 01/2013   weakness of right side from CVA  . Type 2 diabetes mellitus ([place])   . Uses hearing aid 2014   recently received new hearing aids   Past Surgical History:  Procedure Laterality Date  . BIOPSY N/A 04/10/2014   Procedure: GASTRIC BIOPSY;  Surgeon: Daneil Dolin, MD;  Location: [facility] ORS;  Service: Endoscopy;  Laterality: N/A;  . BIOPSY  10/12/2015   Procedure: BIOPSY;  Surgeon: Daneil Dolin, MD;  Location: [facility] ENDO SUITE;  Service: Endoscopy;;  stomach bx's  . CARDIAC DEFIBRILLATOR PLACEMENT  11/12/12   [employer] Inogen MINI ICD implanted in [place] at [facility] per Dr Nelly Laurence note  . COLONOSCOPY WITH PROPOFOL N/A 04/10/2014   RMR: Colonic Diverticulosis  . ESOPHAGOGASTRODUODENOSCOPY (EGD) WITH PROPOFOL N/A 04/10/2014   RMR: Mild erosive reflux esophagitis. Multiple  antral polyps likely hyperplastic status post removal by hot snare cautery technique. Diffusely abnormal stomach status post gastric biopsy I suspect some of patients anemaia may be due to intermittent oozing from the stomach. It would be difficult and a risky proposition to attempt complete removal of all of his gastric polyps.  . ESOPHAGOGASTRODUODENOSCOPY (EGD) WITH PROPOFOL N/A 10/12/2015   Procedure: ESOPHAGOGASTRODUODENOSCOPY (EGD) WITH PROPOFOL;  Surgeon: Daneil Dolin, MD;  Location: [facility] ENDO SU[facility];  Service: Endoscopy;  Laterality: N/A;  2841 - moved to 9:15  . POLYPECTOMY N/A 04/10/2014   Procedure: GASTRIC POLYPECTOMY;  Surgeon: Daneil Dolin, MD;  Location: [facility] ORS;  Service: Endoscopy;  Laterality: N/A;  . RIGHT HEART CATHETERIZATION N/A 02/06/2014   Procedure: RIGHT HEART CATH;  Surgeon: Larey Dresser, MD;  Location: [facility] CATH LAB;  Service: Cardiovascular;  Laterality: N/A;     SOCIAL HISTORY:  Social History   Social History  . Marital status: Single    Spouse name: N/A  . Number of children: N/A  . Years of education: N/A   Occupational History  . disabled    Social History Main Topics  . Smoking status: Former Smoker    Types: Cigarettes    Quit date: 11/05/1993  . Smokeless tobacco: Never Used  . Alcohol use Yes     Comment: former heavy ETOH, none recently  . Drug use: No     Comment: prior history of cocaine use, smoking   . Sexual activity: Yes    Birth control/ protection: None   Other Topics Concern  . Not on file   Social History Narrative   Recently moved from [place] to be with family in [place] (2015).  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  Has h/o polysubstance abuse.  Baseline - able to ambulate, mild cognitive delay, able to toilet and shower himself, occasional use of walker/cane.                FAMILY HISTORY:  Family History  Problem Relation Age of Onset  . Kidney disease Mother   . Kidney disease Sister   .  Colon cancer Neg Hx     CURRENT MEDICATIONS:  Outpatient Encounter Prescriptions as of 07/18/2016  Medication Sig  . acetaminophen (TYLENOL) 325 MG tablet Take 2 tablets (650 mg total) by mouth every 4 (four) hours as needed for mild pain (or temp > 37.5 C (99.5 F)).  Marland Kitchen aspirin 325 MG tablet Take 1 tablet (325 mg total) by mouth daily.  Marland Kitchen atorvastatin (LIPITOR) 80 MG tablet Take 80 mg by mouth daily.   . carvedilol (COREG) 25 MG tablet Take 12.5 mg by mouth 2 (two) times daily with a meal.   . Cholecalciferol (VITAMIN D3) 2000 UNITS TABS Take 2,000 mg by mouth daily.  . digoxin (LANOXIN) 0.125 MG tablet TAKE 1/2 TABLET BY MOUTH DAILY  . feeding supplement, ENSURE ENLIVE, (ENSURE ENLIVE) LIQD Take  237 mLs by mouth 2 (two) times daily between meals.  . ferrous sulfate 325 (65 FE) MG tablet Take 325 mg by mouth daily with breakfast.  . furosemide (LASIX) 40 MG tablet Take 40 mg by mouth daily.  . isosorbide dinitrate (ISORDIL) 20 MG tablet Take 40 mg by mouth 3 (three) times daily.   Marland Kitchen losartan (COZAAR) 25 MG tablet Take 25 mg by mouth daily.  . mirabegron ER (MYRBETRIQ) 25 MG TB24 tablet Take 25 mg by mouth daily.  . mirtazapine (REMERON) 7.5 MG tablet Take 1 tablet by mouth daily.  . Multiple Vitamin (MULTIVITAMIN WITH MINERALS) TABS tablet Take 1 tablet by mouth daily.  . pantoprazole (PROTONIX) 40 MG tablet TAKE ONE TABLET   BY MOUTH   DAILY  . fluconazole (DIFLUCAN) 100 MG tablet Take 1 tablet (100 mg total) by mouth daily.   No facility-administered encounter medications on file as of 07/18/2016.     ALLERGIES:  No Known Allergies   PHYSICAL EXAM:  ECOG Performance status: 3 - Requires considerable assistance    Vitals:   10/10/16 1100  BP: (!) 100/58  Pulse: 73  Resp: 16  Temp: 97.7 F (36.5 C)   There were no vitals filed for this visit.   Physical Exam  Constitutional: He is oriented to person, place, and time. No distress.  Thin, frail male in no distress   HENT:   Head: Normocephalic.  Eyes: Conjunctivae are normal. Pupils are equal, round, and reactive to light. No scleral icterus.  Neck: Normal range of motion. Neck supple.  Cardiovascular: Normal rate and regular rhythm.   Pacemaker in place   Pulmonary/Chest: Effort normal and breath sounds normal. No respiratory distress. He has no wheezes. He has no rales.  Abdominal: Soft. Bowel sounds are normal. There is no tenderness.  Musculoskeletal: He exhibits no edema.  Lymphadenopathy:    He has no cervical adenopathy.  Neurological: He is alert and oriented to person, place, and time.  Mild cognitive delay, particularly when answering questions   Skin: Skin is warm and dry. No rash noted.  Psychiatric: Mood and affect normal.     LABORATORY DATA:  I have reviewed the labs as listed.  CBC    Component Value Date/Time   WBC 2.7 (L) 10/10/2016 1038   RBC 3.41 (L) 10/10/2016 1038   HGB 9.4 (L) 10/10/2016 1038   HCT 30.0 (L) 10/10/2016 1038   PLT 187 10/10/2016 1038   MCV 88.0 10/10/2016 1038   MCH 27.6 10/10/2016 1038   MCHC 31.3 10/10/2016 1038   RDW 15.9 (H) 10/10/2016 1038   LYMPHSABS 1.1 10/10/2016 1038   MONOABS 0.5 10/10/2016 1038   EOSABS 0.2 10/10/2016 1038   BASOSABS 0.0 10/10/2016 1038   CMP Latest Ref Rng & Units 09/15/2016 09/12/2016 09/11/2016  Glucose 65 - 99 mg/dL 116(H) 79 131(H)  BUN 6 - 20 mg/dL 23(H) 32(H) 41(H)  Creatinine 0.61 - 1.24 mg/dL 1.45(H) 1.51(H) 1.81(H)  Sodium 135 - 145 mmol/L 135 140 144  Potassium 3.5 - 5.1 mmol/L 4.5 4.3 4.6  Chloride 101 - 111 mmol/L 108 112(H) 113(H)  CO2 22 - 32 mmol/L 23 26 27   Calcium 8.9 - 10.3 mg/dL 7.8(L) 7.2(L) 7.5(L)  Total Protein 6.5 - 8.1 g/dL - - -  Total Bilirubin 0.3 - 1.2 mg/dL - - -  Alkaline Phos 38 - 126 U/L - - -  AST 15 - 41 U/L - - -  ALT 17 - 63 U/L - - -  PENDING LABS:    DIAGNOSTIC IMAGING:  Recent CT head: 06/26/16   RIGHT KNEE - COMPLETE 4+ VIEW 08/31/2016  IMPRESSION: No acute  findings.  Extensive peripheral vascular calcification and old bone infarcts.  RENAL / URINARY TRACT ULTRASOUND COMPLETE 09/05/2016  PORTABLE CHEST 1 VIEW 09/05/2016  No active disease.    IMPRESSION: 1. No hydronephrosis. 2. Bilateral renal cysts.    PATHOLOGY:  Last EGD (Dr. Gala Romney): 10/12/15      ASSESSMENT & PLAN:   Anemia due to CKD, iron deficiency, and anemia of chronic disease:   - Hemoglobin has been stable. - Continue labs every 2 weeks.  - Continue with  Aranesp injection every 2 weeks as planned. Proceed with aranesp injections if hemoglobin is less than 11 g/dL.  RTC in 3 months for follow up.    All questions were answered to patient's stated satisfaction. Encouraged patient to call with any new concerns or questions before his next visit to the cancer center and we can certain see him sooner, if needed.    This document serves as a record of services personally performed by Twana First, MD. It was created on her behalf by Shirlean Mylar, a trained medical scribe. The creation of this record is based on the scribe's personal observations and the provider's statements to them. This document has been checked and approved by the attending provider.  I have reviewed the above documentation for accuracy and completeness and I agree with the above.

## 2016-10-10 NOTE — Patient Instructions (Addendum)
[place] at [facility] Discharge Instructions  RECOMMENDATIONS MADE BY THE CONSULTANT AND ANY TEST RESULTS WILL BE SENT TO YOUR REFERRING PHYSICIAN.  You were seen today by Dr. Twana First Continue getting Aranesp injection every 2 weeks Follow up in 3 months with lab work   Thank you for choosing [place] at [facility] to provide your oncology and hematology care.  To afford each patient quality time with our provider, please arrive at least 15 minutes before your scheduled appointment time.    If you have a lab appointment with the [facility] please come in thru the  Main Entrance and check in at the main information desk  You need to re-schedule your appointment should you arrive 10 or more minutes late.  We strive to give you quality time with our providers, and arriving late affects you and other patients whose appointments are after yours.  Also, if you no show three or more times for appointments you may be dismissed from the clinic at the providers discretion.     Again, thank you for choosing [facility].  Our hope is that these requests will decrease the amount of time that you wait before being seen by our physicians.       _____________________________________________________________  Should you have questions after your visit to [facility], please contact our office at [phone] between the hours of 8:30 a.m. and 4:30 p.m.  Voicemails left after 4:30 p.m. will not be returned until the following business day.  For prescription refill requests, have your pharmacy contact our office.       Resources For Cancer Patients and their Caregivers ? American Cancer Society: Can assist with transportation, wigs, general needs, runs Look Good Feel Better.        (856)202-7274 ? Cancer Care: Provides financial assistance, online support groups, medication/co-pay assistance.  1-800-813-HOPE  803-614-6910) ? [place] Assists [place] cancer patients and their families through emotional , educational and financial support.  (681)462-2397 ? [place] DSS Where to apply for food stamps, Medicaid and utility assistance. 805-053-3178 ? [facility]: Transportation to medical appointments. 952 433 8318 ? Social Security Administration: May apply for disability if have a Stage IV cancer. (248) 794-1273 9415851497 ? [employer], Disability and Transit Services: Assists with nutrition, care and transit needs. [place] Support Programs: @10RELATIVEDAYS @ > Cancer Support Group  2nd Tuesday of the month 1pm-2pm, Journey Room  > Creative Journey  3rd Tuesday of the month 1130am-1pm, Journey Room  > Look Good Feel Better  1st Wednesday of the month 10am-12 noon, Journey Room (Call [place] to register [phone])

## 2016-10-10 NOTE — Progress Notes (Signed)
NAME:  DIESEL, LINA                    ACCOUNT NO.:  MEDICAL RECORD NO.:  [id number]  LOCATION:                                 FACILITY:  PHYSICIAN:  Susen Haskew L. Luan Pulling, M.D.DATE OF BIRTH:  February 29, 1951  DATE OF PROCEDURE:  10/09/2016 DATE OF DISCHARGE:                                PROGRESS NOTE   This is documentation of my visit at the skilled care facility of Oct 09, 2016.  SUBJECTIVE:  Mr. Revolorio has multiple medical problems including a fairly recent diagnosis of HIV disease.  He has anemia in stage 3 chronic kidney disease.  This morning he says he feels okay.  He is sitting in a wheelchair.  He is very thin.  No new complaints except that he wants some orange juice.  OBJECTIVE:  CONSTITUTIONAL:  He is very thin.  He does appear to have gained weight since he was in the hospital. EYES:  Pupils react. EARS, NOSE, MOUTH, AND THROAT:  His voice is very soft and he is somewhat difficult to understand.  Mucous membranes are moist and his throat is clear. CARDIOVASCULAR:  His heart is regular with normal heart sounds.  No edema.  RESPIRATORY:  His respiratory effort is normal.  He has somewhat diminished breath sounds bilaterally.  GASTROINTESTINAL:  His abdomen is soft with no masses. MUSCULOSKELETAL:  He has weakness bilaterally. NEUROLOGIC:  He has some facial asymmetry from previous stroke, which is unchanged. PSYCHIATRIC:  Normal mood and affect.  I reviewed medications in the [facility], and they are appropriate.  This includes his medications for HIV/AIDS being prescribed by the Infectious Disease specialist, and he has multiple medical problems including cardiomyopathy, renal failure.  His cardiomyopathy seems to be about the same.  His renal failure is stable.  He has diabetes and that is doing okay.  He has protein calorie malnutrition, which is severe and I think that is a little bit better.  He has HIV/AIDS.  He is being treated for that.  Part of the problem with  his protein calorie malnutrition is that he had HIV/AIDS wasting and that is improving with treatment.  He has coronary disease, but no chest pain.  He has hypertension and I have had to markedly reduce his blood pressure medications because of his weight loss.  Currently, his blood pressure is stable.  As mentioned, he has anemia of chronic renal failure and he is being followed for that and has not needed blood transfusion recently.  At baseline, he has COPD and that is pretty stable.  He has a history of stroke, which is unchanged.  PLAN:  Continue with current treatments and medications.  Continue anti- retroviral treatment.  Continue assessment of his renal function because that had gotten much worse when he was not eating.  Continue with assessment of his malnutrition.  As I was completing my dictation, his nurse reported that he had swelling in his scrotum.  I checked his scrotum and he does indeed have swelling.  He is high risk for multiple problems because of his multiple medical issues, so I am going to have him get an ultrasound of the scrotum to  try to figure out whether this is a cyst, hydrocele, mass, etc.     Quandre Polinski L. Luan Pulling, M.D.     ELH/MEDQ  D:  10/09/2016  T:  10/09/2016  Job:  [id number]

## 2016-10-17 ENCOUNTER — Encounter (HOSPITAL_COMMUNITY)
Admission: RE | Admit: 2016-10-17 | Discharge: 2016-10-17 | Disposition: A | Discharge status: Home / Self Care | Payer: Medicare HMO | Source: Skilled Nursing Facility | Attending: Pulmonary Disease | Admitting: Pulmonary Disease

## 2016-10-17 DIAGNOSIS — J449 Chronic obstructive pulmonary disease, unspecified: Secondary | ICD-10-CM | POA: Diagnosis present

## 2016-10-17 DIAGNOSIS — I10 Essential (primary) hypertension: Secondary | ICD-10-CM | POA: Diagnosis present

## 2016-10-17 DIAGNOSIS — N3281 Overactive bladder: Secondary | ICD-10-CM | POA: Insufficient documentation

## 2016-10-17 DIAGNOSIS — I639 Cerebral infarction, unspecified: Secondary | ICD-10-CM | POA: Insufficient documentation

## 2016-10-17 DIAGNOSIS — I5022 Chronic systolic (congestive) heart failure: Secondary | ICD-10-CM | POA: Diagnosis present

## 2016-10-17 DIAGNOSIS — R7881 Bacteremia: Secondary | ICD-10-CM | POA: Diagnosis present

## 2016-10-17 DIAGNOSIS — E1129 Type 2 diabetes mellitus with other diabetic kidney complication: Secondary | ICD-10-CM | POA: Insufficient documentation

## 2016-10-17 DIAGNOSIS — I480 Paroxysmal atrial fibrillation: Secondary | ICD-10-CM | POA: Insufficient documentation

## 2016-10-17 LAB — PROTEIN / CREATININE RATIO, URINE
Creatinine, Urine: 70.63 mg/dL
PROTEIN CREATININE RATIO: 1.42 mg/mg{creat} — AB (ref 0.00–0.15)
Total Protein, Urine: 100 mg/dL

## 2016-10-17 LAB — CBC
HCT: 34.6 % — ABNORMAL LOW (ref 39.0–52.0)
Hemoglobin: 10.6 g/dL — ABNORMAL LOW (ref 13.0–17.0)
MCH: 27.6 pg (ref 26.0–34.0)
MCHC: 30.6 g/dL (ref 30.0–36.0)
MCV: 90.1 fL (ref 78.0–100.0)
PLATELETS: 188 10*3/uL (ref 150–400)
RBC: 3.84 MIL/uL — AB (ref 4.22–5.81)
RDW: 16.8 % — AB (ref 11.5–15.5)
WBC: 2.4 10*3/uL — AB (ref 4.0–10.5)

## 2016-10-17 LAB — RENAL FUNCTION PANEL
ALBUMIN: 2.6 g/dL — AB (ref 3.5–5.0)
Anion gap: 5 (ref 5–15)
BUN: 59 mg/dL — ABNORMAL HIGH (ref 6–20)
CALCIUM: 9.6 mg/dL (ref 8.9–10.3)
CO2: 29 mmol/L (ref 22–32)
CREATININE: 1.57 mg/dL — AB (ref 0.61–1.24)
Chloride: 100 mmol/L — ABNORMAL LOW (ref 101–111)
GFR, EST AFRICAN AMERICAN: 52 mL/min — AB (ref >60)
GFR, EST NON AFRICAN AMERICAN: 45 mL/min — AB (ref >60)
Glucose, Bld: 128 mg/dL — ABNORMAL HIGH (ref 65–99)
PHOSPHORUS: 3.4 mg/dL (ref 2.5–4.6)
Potassium: 4.8 mmol/L (ref 3.5–5.1)
Sodium: 134 mmol/L — ABNORMAL LOW (ref 135–145)

## 2016-10-17 LAB — FERRITIN: Ferritin: 357 ng/mL — ABNORMAL HIGH (ref 24–336)

## 2016-10-17 LAB — IRON AND TIBC
IRON: 43 ug/dL — AB (ref 45–182)
Saturation Ratios: 18 % (ref 17.9–39.5)
TIBC: 245 ug/dL — ABNORMAL LOW (ref 250–450)
UIBC: 202 ug/dL

## 2016-10-18 ENCOUNTER — Ambulatory Visit (INDEPENDENT_AMBULATORY_CARE_PROVIDER_SITE_OTHER): Payer: Medicare HMO | Admitting: Internal Medicine

## 2016-10-18 ENCOUNTER — Encounter (HOSPITAL_COMMUNITY)
Admission: RE | Admit: 2016-10-18 | Discharge: 2016-10-18 | Disposition: A | Discharge status: Home / Self Care | Payer: Medicare HMO | Source: Skilled Nursing Facility | Attending: Pulmonary Disease | Admitting: Pulmonary Disease

## 2016-10-18 VITALS — Ht 62.0 in

## 2016-10-18 DIAGNOSIS — I5022 Chronic systolic (congestive) heart failure: Secondary | ICD-10-CM | POA: Diagnosis present

## 2016-10-18 DIAGNOSIS — J449 Chronic obstructive pulmonary disease, unspecified: Secondary | ICD-10-CM | POA: Diagnosis present

## 2016-10-18 DIAGNOSIS — I10 Essential (primary) hypertension: Secondary | ICD-10-CM | POA: Insufficient documentation

## 2016-10-18 DIAGNOSIS — B20 Human immunodeficiency virus [HIV] disease: Secondary | ICD-10-CM

## 2016-10-18 DIAGNOSIS — E1129 Type 2 diabetes mellitus with other diabetic kidney complication: Secondary | ICD-10-CM | POA: Insufficient documentation

## 2016-10-18 DIAGNOSIS — R7881 Bacteremia: Secondary | ICD-10-CM | POA: Diagnosis present

## 2016-10-18 DIAGNOSIS — N3281 Overactive bladder: Secondary | ICD-10-CM | POA: Insufficient documentation

## 2016-10-18 DIAGNOSIS — I639 Cerebral infarction, unspecified: Secondary | ICD-10-CM | POA: Insufficient documentation

## 2016-10-18 DIAGNOSIS — I480 Paroxysmal atrial fibrillation: Secondary | ICD-10-CM | POA: Diagnosis present

## 2016-10-18 LAB — CBC
HCT: 32.2 % — ABNORMAL LOW (ref 39.0–52.0)
Hemoglobin: 10.1 g/dL — ABNORMAL LOW (ref 13.0–17.0)
MCH: 27.9 pg (ref 26.0–34.0)
MCHC: 31.4 g/dL (ref 30.0–36.0)
MCV: 89 fL (ref 78.0–100.0)
PLATELETS: 195 10*3/uL (ref 150–400)
RBC: 3.62 MIL/uL — ABNORMAL LOW (ref 4.22–5.81)
RDW: 17 % — ABNORMAL HIGH (ref 11.5–15.5)
WBC: 3.2 10*3/uL — ABNORMAL LOW (ref 4.0–10.5)

## 2016-10-18 LAB — RENAL FUNCTION PANEL
ALBUMIN: 2.6 g/dL — AB (ref 3.5–5.0)
Anion gap: 7 (ref 5–15)
BUN: 62 mg/dL — AB (ref 6–20)
CO2: 26 mmol/L (ref 22–32)
CREATININE: 1.54 mg/dL — AB (ref 0.61–1.24)
Calcium: 9.6 mg/dL (ref 8.9–10.3)
Chloride: 99 mmol/L — ABNORMAL LOW (ref 101–111)
GFR, EST AFRICAN AMERICAN: 53 mL/min — AB (ref >60)
GFR, EST NON AFRICAN AMERICAN: 46 mL/min — AB (ref >60)
Glucose, Bld: 124 mg/dL — ABNORMAL HIGH (ref 65–99)
PHOSPHORUS: 4.3 mg/dL (ref 2.5–4.6)
POTASSIUM: 5.5 mmol/L — AB (ref 3.5–5.1)
Sodium: 132 mmol/L — ABNORMAL LOW (ref 135–145)

## 2016-10-18 LAB — RETICULOCYTES
RBC.: 3.62 MIL/uL — AB (ref 4.22–5.81)
RETIC CT PCT: 2.3 % (ref 0.4–3.1)
Retic Count, Absolute: 83.3 10*3/uL (ref 19.0–186.0)

## 2016-10-18 LAB — IRON AND TIBC
Iron: 36 ug/dL — ABNORMAL LOW (ref 45–182)
Saturation Ratios: 14 % — ABNORMAL LOW (ref 17.9–39.5)
TIBC: 256 ug/dL (ref 250–450)
UIBC: 220 ug/dL

## 2016-10-18 LAB — FERRITIN: Ferritin: 301 ng/mL (ref 24–336)

## 2016-10-18 NOTE — Progress Notes (Signed)
Patient ID: Keith Young, male    DOB: [birth year], 66 y.o.   MRN: [id number]  Reason for visit: to establish care as a new patient with HIV  HPI:   Patient was first diagnosed last month during a hospitalization at [facility].   He was tested during a recent hospitalizations for failure to thrive and confusion and noted a CD4 of 40 and a viral load of 105,000.  Unfortunately the genotype was not done.  He comes in today accompanied by his sister who states she was unaware of the diagnosis prior to April and he had been living in [place] and they brought him here since he was doing poorly.  He does not recall ever being on ARVs or hearing about being HIV positive.  Nothing in Care Everywhere.  He was started on Tivicay and Descovy and currently is at the [facility].  He is eating poorly according to his sister.  He does not say much or participate much in the conversations.     Past Medical History:  Diagnosis Date  . AICD (automatic cardioverter/defibrillator) present   . Alcoholism in remission ([place])   . Anemia   . Aortic insufficiency   . Arthritis   . At moderate risk for fall   . Bell's palsy   . Cardiomyopathy ([place])   . Chronic systolic heart failure (HCC)    NYHA Class III  . CKD (chronic kidney disease) stage 3, GFR 30-59 ml/min   . COPD (chronic obstructive pulmonary disease) ([place])   . Essential hypertension, benign   . HIV disease ([place]) 09/06/2016  . Hyperlipidemia   . Noncompliance with medication regimen   . Nonischemic cardiomyopathy (HCC)    EF 15-20%  . NSVT (nonsustained ventricular tachycardia) ([place])   . Numbness of right jaw    Had a stoke in 01/2013. Numbness is occasional, especially when trying to chew.  . Productive cough 08/2013   With brown sputum   . SOB (shortness of breath) on exertion 08/2013  . Stroke ([place]) 01/2013   weakness of right side from CVA  . Type 2 diabetes mellitus ([place])   . Uses hearing aid 2014   recently received new hearing aids    Prior to  Admission medications   Medication Sig Start Date End Date Taking? Authorizing Provider  acetaminophen (TYLENOL) 325 MG tablet Take 2 tablets (650 mg total) by mouth every 4 (four) hours as needed for mild pain (or temp > 37.5 C (99.5 F)). 06/30/16  Yes Sinda Du, MD  aspirin 325 MG tablet Take 1 tablet (325 mg total) by mouth daily. 06/30/16  Yes Sinda Du, MD  atorvastatin (LIPITOR) 80 MG tablet Take 80 mg by mouth daily.    Yes [provider]  atovaquone (MEPRON) 750 MG/5ML suspension Take 10 mLs (1,500 mg total) by mouth daily with breakfast. 09/19/16  Yes Caprisha Bridgett, Okey Regal, MD  carvedilol (COREG) 3.125 MG tablet Take 1 tablet (3.125 mg total) by mouth 2 (two) times daily with a meal. 09/12/16  Yes Sinda Du, MD  carvedilol (COREG) 6.25 MG tablet  08/17/16  Yes [provider]  digoxin (LANOXIN) 0.125 MG tablet TAKE 1/2 TABLET BY MOUTH DAILY 07/01/16  Yes Bensimhon, Shaune Pascal, MD  dolutegravir (TIVICAY) 50 MG tablet Take 1 tablet (50 mg total) by mouth daily. 09/13/16  Yes Sinda Du, MD  dronabinol (MARINOL) 2.5 MG capsule Take 1 capsule (2.5 mg total) by mouth 2 (two) times daily before lunch and supper. 09/12/16  Yes Sinda Du, MD  emtricitabine-tenofovir AF (DESCOVY) 200-25 MG tablet Take 1 tablet by mouth daily. 09/12/16  Yes Sinda Du, MD  feeding supplement, ENSURE ENLIVE, (ENSURE ENLIVE) LIQD Take 237 mLs by mouth 2 (two) times daily between meals. 06/30/16  Yes Sinda Du, MD  ferrous sulfate 325 (65 FE) MG tablet Take 325 mg by mouth daily with breakfast.   Yes [provider]  mirabegron ER (MYRBETRIQ) 25 MG TB24 tablet Take 25 mg by mouth daily.   Yes [provider]  Multiple Vitamin (MULTIVITAMIN WITH MINERALS) TABS tablet Take 1 tablet by mouth daily. 06/30/16  Yes Sinda Du, MD  pantoprazole (PROTONIX) 40 MG tablet TAKE ONE TABLET   BY MOUTH   DAILY 05/25/16  Yes Mahala Menghini, PA-C  cefUROXime (CEFTIN) 500 MG  tablet Take 1 tablet (500 mg total) by mouth 2 (two) times daily with a meal. Patient not taking: Reported on 10/18/2016 09/12/16   Sinda Du, MD  Cholecalciferol (VITAMIN D3) 2000 UNITS TABS Take 2,000 mg by mouth daily.    [provider]  furosemide (LASIX) 20 MG tablet  08/10/16   [provider]  isosorbide dinitrate (ISORDIL) 20 MG tablet  08/08/16   [provider]  losartan (COZAAR) 25 MG tablet  08/10/16   [provider]  mirtazapine (REMERON) 15 MG tablet  08/22/16   [provider]    No Known Allergies  Social History  Substance Use Topics  . Smoking status: Former Smoker    Types: Cigarettes    Quit date: 11/05/1993  . Smokeless tobacco: Never Used  . Alcohol use Yes     Comment: former heavy ETOH, none recently    Family History  Problem Relation Age of Onset  . Kidney disease Mother   . Kidney disease Sister   . Colon cancer Neg Hx     Review of Systems Constitutional: negative for fevers, chills and fatigue Gastrointestinal: negative for diarrhea All other systems reviewed and are negative    CONSTITUTIONAL:in no apparent distress; cachectic Height 5\' 2"  (1.575 m). EYES: anicteric HENT: + thrush CARD:Cor RRR RESP:CTA B; normal respiratory effort IO:XBDZH sounds are normal, liver is not enlarged, spleen is not enlarged, soft, nt MS:no pedal edema noted, very thin SKIN:no rashes NEURO: non-focal, diffuse weakness, currently in a wheelchair   Assessment: new patient here with HIV.  Discussed with patient treatment options and side effects, benefits of treatment, long term outcomes.  I discussed the severity of untreated HIV including higher cancer risk, opportunistic infections, renal failure.  Also discussed needing to use condoms, partner disclosure, necessary vaccines, blood monitoring. I discussed the severity of his current condition and that he needs to continue to eat, take the ARVs and with time can improve  and would anticipate 3-6 months at least to see good improvement. All questions answered.   Also with thrush noted on tongue.  ? If he also has esophageal involvement.   Has renal insufficiency and monitored at the [facility].  Has been stable, today creat is 1.54  Plan: 1) continue Tivicay and Descovy 2) continue PCP prophylaxis 3) labs today  4) fluconazole 200 mg daily for 2 weeks 5) consider stopping ppi due to renal insufficiency if not needed

## 2016-10-19 LAB — T-HELPER CELL (CD4) - (RCID CLINIC ONLY)
CD4 T CELL HELPER: 19 % — AB (ref 33–55)
CD4 T Cell Abs: 230 /uL — ABNORMAL LOW (ref 400–2700)

## 2016-10-21 LAB — HIV-1 RNA QUANT-NO REFLEX-BLD
HIV 1 RNA QUANT: 418 {copies}/mL — AB
HIV-1 RNA Quant, Log: 2.62 Log copies/mL — ABNORMAL HIGH

## 2016-10-25 ENCOUNTER — Encounter (HOSPITAL_BASED_OUTPATIENT_CLINIC_OR_DEPARTMENT_OTHER): Payer: Medicare HMO

## 2016-10-25 ENCOUNTER — Encounter (HOSPITAL_COMMUNITY): Payer: Medicare HMO

## 2016-10-25 VITALS — BP 89/62 | HR 72 | Temp 98.2°F | Resp 20

## 2016-10-25 DIAGNOSIS — N183 Chronic kidney disease, stage 3 unspecified: Secondary | ICD-10-CM

## 2016-10-25 DIAGNOSIS — D631 Anemia in chronic kidney disease: Secondary | ICD-10-CM | POA: Diagnosis not present

## 2016-10-25 DIAGNOSIS — N189 Chronic kidney disease, unspecified: Secondary | ICD-10-CM

## 2016-10-25 DIAGNOSIS — D61818 Other pancytopenia: Secondary | ICD-10-CM

## 2016-10-25 LAB — CBC WITH DIFFERENTIAL/PLATELET
BASOS ABS: 0 10*3/uL (ref 0.0–0.1)
BASOS PCT: 0 %
EOS ABS: 0.4 10*3/uL (ref 0.0–0.7)
Eosinophils Relative: 11 %
HCT: 33.6 % — ABNORMAL LOW (ref 39.0–52.0)
Hemoglobin: 10.4 g/dL — ABNORMAL LOW (ref 13.0–17.0)
Lymphocytes Relative: 37 %
Lymphs Abs: 1.4 10*3/uL (ref 0.7–4.0)
MCH: 28.2 pg (ref 26.0–34.0)
MCHC: 31 g/dL (ref 30.0–36.0)
MCV: 91.1 fL (ref 78.0–100.0)
Monocytes Absolute: 0.5 10*3/uL (ref 0.1–1.0)
Monocytes Relative: 15 %
Neutro Abs: 1.4 10*3/uL — ABNORMAL LOW (ref 1.7–7.7)
Neutrophils Relative %: 37 %
PLATELETS: 139 10*3/uL — AB (ref 150–400)
RBC: 3.69 MIL/uL — AB (ref 4.22–5.81)
RDW: 17 % — ABNORMAL HIGH (ref 11.5–15.5)
WBC: 3.7 10*3/uL — AB (ref 4.0–10.5)

## 2016-10-25 MED ORDER — DARBEPOETIN ALFA 60 MCG/0.3ML IJ SOSY
PREFILLED_SYRINGE | INTRAMUSCULAR | Status: AC
  Filled 2016-10-25: qty 0.3

## 2016-10-25 MED ORDER — DARBEPOETIN ALFA 60 MCG/0.3ML IJ SOSY
50.0000 ug | PREFILLED_SYRINGE | Freq: Once | INTRAMUSCULAR | Status: AC
  Administered 2016-10-25: 50 ug via SUBCUTANEOUS

## 2016-10-25 NOTE — Progress Notes (Signed)
Aranesp 40mcg given Subcutaneously in abdominal tissue.  Site within normal limits and tolerated well.    Could not perform medication reconciliation due to nursing home not providing list.  Patient was unable to answer.

## 2016-10-25 NOTE — Patient Instructions (Signed)
[place] at [facility] Discharge Instructions  RECOMMENDATIONS MADE BY THE CONSULTANT AND ANY TEST RESULTS WILL BE SENT TO YOUR REFERRING PHYSICIAN.  Hemoglobin 10.4. Aranesp 50 mcg injection given as ordered. Return as scheduled.  Thank you for choosing [place] at [facility] to provide your oncology and hematology care.  To afford each patient quality time with our provider, please arrive at least 15 minutes before your scheduled appointment time.    If you have a lab appointment with the [facility] please come in thru the  Main Entrance and check in at the main information desk  You need to re-schedule your appointment should you arrive 10 or more minutes late.  We strive to give you quality time with our providers, and arriving late affects you and other patients whose appointments are after yours.  Also, if you no show three or more times for appointments you may be dismissed from the clinic at the providers discretion.     Again, thank you for choosing [facility].  Our hope is that these requests will decrease the amount of time that you wait before being seen by our physicians.       _____________________________________________________________  Should you have questions after your visit to [facility], please contact our office at [phone] between the hours of 8:30 a.m. and 4:30 p.m.  Voicemails left after 4:30 p.m. will not be returned until the following business day.  For prescription refill requests, have your pharmacy contact our office.       Resources For Cancer Patients and their Caregivers ? American Cancer Society: Can assist with transportation, wigs, general needs, runs Look Good Feel Better.        670-062-2515 ? Cancer Care: Provides financial assistance, online support groups, medication/co-pay assistance.  1-800-813-HOPE (628) 323-3029) ? [place] Assists  [place] cancer patients and their families through emotional , educational and financial support.  916-583-7860 ? [place] DSS Where to apply for food stamps, Medicaid and utility assistance. (262)268-8293 ? RCATS: Transportation to medical appointments. 307 689 0612 ? Social Security Administration: May apply for disability if have a Stage IV cancer. 681-861-3300 931-184-0254 ? [employer], Disability and Transit Services: Assists with nutrition, care and transit needs. [place] Support Programs: @10RELATIVEDAYS @ > Cancer Support Group  2nd Tuesday of the month 1pm-2pm, Journey Room  > Creative Journey  3rd Tuesday of the month 1130am-1pm, Journey Room  > Look Good Feel Better  1st Wednesday of the month 10am-12 noon, Journey Room (Call [place] to register [phone])

## 2016-11-05 NOTE — Progress Notes (Signed)
This is documentation of my visit at the skilled care facility for 11/05/2016  Subjective: He is sitting quietly in his room when I arrived. He is in a wheelchair. He has no complaints. He says he feels better. He is eating better by his history.  Objective: Constitutional: He is still very thin. Ears nose mouth and throat: He is somewhat hoarse. Cardiovascular: His heart is regular with normal heart sounds. Respiratory: His respiratory effort is normal and his lungs are clear. Musculoskeletal: He is in a wheelchair. He is weak in the extremities. Skin: Warm and dry. Psychiatric: He appears depressed. Neurological: He still has some facial asymmetry  I reviewed his MAR and his medications are appropriate  Assessment: He has multiple medical problems including COPD which is pretty stable, congestive heart failure which seems to be doing better, fairly recent stroke with some residual effects, recent diagnosis of HIV AIDS with wasting which is better and he is on treatment for that.  Plan: Continue treatments

## 2016-11-08 ENCOUNTER — Encounter (HOSPITAL_COMMUNITY): Payer: Medicare HMO

## 2016-11-08 ENCOUNTER — Encounter (HOSPITAL_COMMUNITY): Payer: Medicare HMO | Attending: Oncology

## 2016-11-08 ENCOUNTER — Encounter (HOSPITAL_COMMUNITY): Payer: Self-pay

## 2016-11-08 DIAGNOSIS — D631 Anemia in chronic kidney disease: Secondary | ICD-10-CM | POA: Diagnosis present

## 2016-11-08 DIAGNOSIS — D61818 Other pancytopenia: Secondary | ICD-10-CM | POA: Insufficient documentation

## 2016-11-08 DIAGNOSIS — N183 Chronic kidney disease, stage 3 (moderate): Secondary | ICD-10-CM | POA: Diagnosis not present

## 2016-11-08 LAB — CBC WITH DIFFERENTIAL/PLATELET
BASOS ABS: 0 10*3/uL (ref 0.0–0.1)
Basophils Relative: 0 %
EOS ABS: 0.1 10*3/uL (ref 0.0–0.7)
Eosinophils Relative: 5 %
HEMATOCRIT: 38.5 % — AB (ref 39.0–52.0)
Hemoglobin: 12.2 g/dL — ABNORMAL LOW (ref 13.0–17.0)
Lymphocytes Relative: 28 %
Lymphs Abs: 0.8 10*3/uL (ref 0.7–4.0)
MCH: 29.5 pg (ref 26.0–34.0)
MCHC: 31.7 g/dL (ref 30.0–36.0)
MCV: 93.2 fL (ref 78.0–100.0)
MONO ABS: 0.3 10*3/uL (ref 0.1–1.0)
Monocytes Relative: 11 %
NEUTROS ABS: 1.6 10*3/uL — AB (ref 1.7–7.7)
NEUTROS PCT: 56 %
PLATELETS: 117 10*3/uL — AB (ref 150–400)
RBC: 4.13 MIL/uL — ABNORMAL LOW (ref 4.22–5.81)
RDW: 17.4 % — AB (ref 11.5–15.5)
WBC: 2.9 10*3/uL — ABNORMAL LOW (ref 4.0–10.5)

## 2016-11-08 NOTE — Progress Notes (Signed)
Hgb 12.2 today so Aranesp injection was held per parameters. Pt and caregiver informed of this and verbalized understanding. Pt discharged via wheelchair in stable condition

## 2016-11-09 ENCOUNTER — Encounter: Payer: Medicare HMO | Admitting: *Deleted

## 2016-11-09 ENCOUNTER — Telehealth: Payer: Self-pay | Admitting: Cardiology

## 2016-11-09 NOTE — Telephone Encounter (Signed)
Confirmed remote transmission w/ pt nurse.   

## 2016-11-11 ENCOUNTER — Encounter: Payer: Self-pay | Admitting: Cardiology

## 2016-11-18 ENCOUNTER — Encounter (HOSPITAL_COMMUNITY)
Admission: RE | Admit: 2016-11-18 | Discharge: 2016-11-18 | Disposition: A | Discharge status: Home / Self Care | Payer: Medicare HMO | Source: Skilled Nursing Facility | Attending: Pulmonary Disease | Admitting: Pulmonary Disease

## 2016-11-18 DIAGNOSIS — I480 Paroxysmal atrial fibrillation: Secondary | ICD-10-CM | POA: Diagnosis present

## 2016-11-18 DIAGNOSIS — J449 Chronic obstructive pulmonary disease, unspecified: Secondary | ICD-10-CM | POA: Diagnosis present

## 2016-11-18 DIAGNOSIS — I5022 Chronic systolic (congestive) heart failure: Secondary | ICD-10-CM | POA: Insufficient documentation

## 2016-11-18 DIAGNOSIS — N3281 Overactive bladder: Secondary | ICD-10-CM | POA: Diagnosis present

## 2016-11-18 DIAGNOSIS — Z9181 History of falling: Secondary | ICD-10-CM | POA: Diagnosis present

## 2016-11-18 DIAGNOSIS — I639 Cerebral infarction, unspecified: Secondary | ICD-10-CM | POA: Insufficient documentation

## 2016-11-18 LAB — CBC
HCT: 36.5 % — ABNORMAL LOW (ref 39.0–52.0)
HEMOGLOBIN: 11.6 g/dL — AB (ref 13.0–17.0)
MCH: 29.1 pg (ref 26.0–34.0)
MCHC: 31.8 g/dL (ref 30.0–36.0)
MCV: 91.7 fL (ref 78.0–100.0)
Platelets: 103 10*3/uL — ABNORMAL LOW (ref 150–400)
RBC: 3.98 MIL/uL — ABNORMAL LOW (ref 4.22–5.81)
RDW: 16 % — AB (ref 11.5–15.5)
WBC: 2.1 10*3/uL — ABNORMAL LOW (ref 4.0–10.5)

## 2016-11-18 LAB — COMPREHENSIVE METABOLIC PANEL
ALK PHOS: 82 U/L (ref 38–126)
ALT: 96 U/L — ABNORMAL HIGH (ref 17–63)
ANION GAP: 5 (ref 5–15)
AST: 60 U/L — ABNORMAL HIGH (ref 15–41)
Albumin: 2.9 g/dL — ABNORMAL LOW (ref 3.5–5.0)
BILIRUBIN TOTAL: 0.2 mg/dL — AB (ref 0.3–1.2)
BUN: 82 mg/dL — ABNORMAL HIGH (ref 6–20)
CALCIUM: 9.8 mg/dL (ref 8.9–10.3)
CO2: 28 mmol/L (ref 22–32)
Chloride: 102 mmol/L (ref 101–111)
Creatinine, Ser: 1.74 mg/dL — ABNORMAL HIGH (ref 0.61–1.24)
GFR calc non Af Amer: 40 mL/min — ABNORMAL LOW (ref >60)
GFR, EST AFRICAN AMERICAN: 46 mL/min — AB (ref >60)
Glucose, Bld: 155 mg/dL — ABNORMAL HIGH (ref 65–99)
POTASSIUM: 4.9 mmol/L (ref 3.5–5.1)
Sodium: 135 mmol/L (ref 135–145)
TOTAL PROTEIN: 8.2 g/dL — AB (ref 6.5–8.1)

## 2016-11-22 ENCOUNTER — Other Ambulatory Visit (HOSPITAL_COMMUNITY): Payer: Medicare HMO

## 2016-11-22 ENCOUNTER — Ambulatory Visit (HOSPITAL_COMMUNITY): Payer: Medicare HMO

## 2016-11-29 ENCOUNTER — Ambulatory Visit (INDEPENDENT_AMBULATORY_CARE_PROVIDER_SITE_OTHER): Payer: Medicare HMO | Admitting: Internal Medicine

## 2016-11-29 ENCOUNTER — Encounter: Payer: Self-pay | Admitting: Internal Medicine

## 2016-11-29 VITALS — BP 125/77 | HR 71 | Temp 97.7°F

## 2016-11-29 DIAGNOSIS — E43 Unspecified severe protein-calorie malnutrition: Secondary | ICD-10-CM

## 2016-11-29 DIAGNOSIS — Z9189 Other specified personal risk factors, not elsewhere classified: Secondary | ICD-10-CM

## 2016-11-29 DIAGNOSIS — B20 Human immunodeficiency virus [HIV] disease: Secondary | ICD-10-CM | POA: Diagnosis not present

## 2016-11-29 NOTE — Assessment & Plan Note (Signed)
If his CD4 > 200 today, he can stop the Bactrim.

## 2016-11-29 NOTE — Progress Notes (Signed)
   Subjective:    Patient ID: Keith Young, male    DOB: 09/27/1950, 66 y.o.   MRN: [id number]  HPI Here for follow up of HIV He was a new patient last visit with a low CD4 and had already started Tivicay and Descovy by his PCP in consultation with Dr. Tommy Medal.  His medications continue to be provided for him. His last CD4 count at the visit was up to 230 which was up from 40 and viral load down to 418 from 105,000.  He does not have any complaints.  He is here with his brother in law who tells me he has not been eating well.     Review of Systems  HENT: Negative for trouble swallowing.   Gastrointestinal: Negative for diarrhea.  Skin: Negative for rash.       Objective:   Physical Exam  Constitutional:  Chronically ill-appearing  HENT:  Mouth/Throat: No oropharyngeal exudate.  Eyes: No scleral icterus.  Cardiovascular: Normal rate, regular rhythm and normal heart sounds.   Pulmonary/Chest: Effort normal and breath sounds normal. No respiratory distress.  Lymphadenopathy:    He has no cervical adenopathy.  Skin: No rash noted.   SH: lives at the [place]:

## 2016-11-29 NOTE — Assessment & Plan Note (Signed)
I encouraged continued efforts at caloric intake

## 2016-11-29 NOTE — Assessment & Plan Note (Signed)
I will check his labs today to be sure his viral load remains suppressed.

## 2016-11-30 NOTE — Progress Notes (Signed)
This is documentation of my visit at the skilled care facility of 11/27/2016. I did not dictated on that date because [facility] is not available at the skilled care facility.  Subjective: He's had another episode of what looks like yeast in his mouth. He says he feels better. He is clearly much more awake and alert than the last visit. No new problems identified. He's not short of breath. He's had no chest pain. No overt symptoms of heart failure  I reviewed medications in the [facility]. There appear to be appropriate  Objective: Constitutional: He is very weak in appearance. He is thin and looks malnourished. Ears nose mouth and throat: Mucous membranes are dry. He does not have overt yeast in his mouth now but he has started treatment. Cardiovascular: His heart is regular with a heart murmur that is systolic. Respiratory: His respiratory effort is normal. His lungs are clear. Gastrointestinal: His abdomen is soft with no masses. Skin: Warm and dry neurological: No focal abnormalities  Assessment: He has HIV disease. He has multiple other medical problems including coronary disease, CHF, COPD, malnutrition from his HIV hypertension and previous stroke. He looks better in general.  Plan: Continue current treatments

## 2016-12-01 LAB — T-HELPER CELL (CD4) - (RCID CLINIC ONLY)
CD4 % Helper T Cell: 27 % — ABNORMAL LOW (ref 33–55)
CD4 T Cell Abs: 170 /uL — ABNORMAL LOW (ref 400–2700)

## 2016-12-02 LAB — HIV-1 RNA QUANT-NO REFLEX-BLD
HIV 1 RNA Quant: 65 copies/mL — ABNORMAL HIGH
HIV-1 RNA Quant, Log: 1.81 Log copies/mL — ABNORMAL HIGH

## 2016-12-06 ENCOUNTER — Ambulatory Visit (HOSPITAL_COMMUNITY): Payer: Medicare HMO

## 2016-12-06 ENCOUNTER — Other Ambulatory Visit (HOSPITAL_COMMUNITY): Payer: Medicare HMO

## 2016-12-09 ENCOUNTER — Emergency Department (HOSPITAL_COMMUNITY): Payer: Medicare HMO

## 2016-12-09 ENCOUNTER — Encounter (HOSPITAL_COMMUNITY): Payer: Self-pay | Admitting: Emergency Medicine

## 2016-12-09 ENCOUNTER — Inpatient Hospital Stay (HOSPITAL_COMMUNITY)
Admission: EM | Admit: 2016-12-09 | Discharge: 2016-12-13 | DRG: 974 | Disposition: A | Discharge status: Skilled Nursing Facility | Payer: Medicare HMO | Attending: Pulmonary Disease | Admitting: Pulmonary Disease

## 2016-12-09 DIAGNOSIS — N390 Urinary tract infection, site not specified: Secondary | ICD-10-CM | POA: Diagnosis present

## 2016-12-09 DIAGNOSIS — M1990 Unspecified osteoarthritis, unspecified site: Secondary | ICD-10-CM | POA: Diagnosis present

## 2016-12-09 DIAGNOSIS — E1122 Type 2 diabetes mellitus with diabetic chronic kidney disease: Secondary | ICD-10-CM | POA: Diagnosis present

## 2016-12-09 DIAGNOSIS — N189 Chronic kidney disease, unspecified: Secondary | ICD-10-CM | POA: Diagnosis not present

## 2016-12-09 DIAGNOSIS — Z7982 Long term (current) use of aspirin: Secondary | ICD-10-CM | POA: Diagnosis not present

## 2016-12-09 DIAGNOSIS — Z681 Body mass index (BMI) 19 or less, adult: Secondary | ICD-10-CM

## 2016-12-09 DIAGNOSIS — D631 Anemia in chronic kidney disease: Secondary | ICD-10-CM | POA: Diagnosis present

## 2016-12-09 DIAGNOSIS — I69351 Hemiplegia and hemiparesis following cerebral infarction affecting right dominant side: Secondary | ICD-10-CM

## 2016-12-09 DIAGNOSIS — A419 Sepsis, unspecified organism: Principal | ICD-10-CM | POA: Diagnosis present

## 2016-12-09 DIAGNOSIS — N179 Acute kidney failure, unspecified: Secondary | ICD-10-CM

## 2016-12-09 DIAGNOSIS — I5042 Chronic combined systolic (congestive) and diastolic (congestive) heart failure: Secondary | ICD-10-CM | POA: Diagnosis present

## 2016-12-09 DIAGNOSIS — E43 Unspecified severe protein-calorie malnutrition: Secondary | ICD-10-CM | POA: Diagnosis present

## 2016-12-09 DIAGNOSIS — D61818 Other pancytopenia: Secondary | ICD-10-CM | POA: Diagnosis present

## 2016-12-09 DIAGNOSIS — Z87891 Personal history of nicotine dependence: Secondary | ICD-10-CM | POA: Diagnosis not present

## 2016-12-09 DIAGNOSIS — E785 Hyperlipidemia, unspecified: Secondary | ICD-10-CM | POA: Diagnosis present

## 2016-12-09 DIAGNOSIS — F1021 Alcohol dependence, in remission: Secondary | ICD-10-CM | POA: Diagnosis present

## 2016-12-09 DIAGNOSIS — R6521 Severe sepsis with septic shock: Secondary | ICD-10-CM | POA: Diagnosis present

## 2016-12-09 DIAGNOSIS — B20 Human immunodeficiency virus [HIV] disease: Secondary | ICD-10-CM | POA: Diagnosis present

## 2016-12-09 DIAGNOSIS — J449 Chronic obstructive pulmonary disease, unspecified: Secondary | ICD-10-CM | POA: Diagnosis present

## 2016-12-09 DIAGNOSIS — I429 Cardiomyopathy, unspecified: Secondary | ICD-10-CM | POA: Diagnosis present

## 2016-12-09 DIAGNOSIS — I130 Hypertensive heart and chronic kidney disease with heart failure and stage 1 through stage 4 chronic kidney disease, or unspecified chronic kidney disease: Secondary | ICD-10-CM | POA: Diagnosis present

## 2016-12-09 DIAGNOSIS — Z841 Family history of disorders of kidney and ureter: Secondary | ICD-10-CM

## 2016-12-09 DIAGNOSIS — R310 Gross hematuria: Secondary | ICD-10-CM | POA: Diagnosis present

## 2016-12-09 DIAGNOSIS — R4182 Altered mental status, unspecified: Secondary | ICD-10-CM | POA: Diagnosis present

## 2016-12-09 DIAGNOSIS — G9341 Metabolic encephalopathy: Secondary | ICD-10-CM | POA: Diagnosis present

## 2016-12-09 DIAGNOSIS — E869 Volume depletion, unspecified: Secondary | ICD-10-CM | POA: Diagnosis present

## 2016-12-09 DIAGNOSIS — N183 Chronic kidney disease, stage 3 unspecified: Secondary | ICD-10-CM | POA: Diagnosis present

## 2016-12-09 DIAGNOSIS — Z95810 Presence of automatic (implantable) cardiac defibrillator: Secondary | ICD-10-CM | POA: Diagnosis not present

## 2016-12-09 DIAGNOSIS — B964 Proteus (mirabilis) (morganii) as the cause of diseases classified elsewhere: Secondary | ICD-10-CM | POA: Diagnosis present

## 2016-12-09 DIAGNOSIS — R319 Hematuria, unspecified: Secondary | ICD-10-CM

## 2016-12-09 DIAGNOSIS — Z66 Do not resuscitate: Secondary | ICD-10-CM | POA: Diagnosis present

## 2016-12-09 DIAGNOSIS — I360 Nonrheumatic tricuspid (valve) stenosis: Secondary | ICD-10-CM | POA: Diagnosis not present

## 2016-12-09 LAB — CBC WITH DIFFERENTIAL/PLATELET
Basophils Absolute: 0 10*3/uL (ref 0.0–0.1)
Basophils Relative: 0 %
EOS ABS: 0.1 10*3/uL (ref 0.0–0.7)
Eosinophils Relative: 2 %
HEMATOCRIT: 33.9 % — AB (ref 39.0–52.0)
HEMOGLOBIN: 10.9 g/dL — AB (ref 13.0–17.0)
LYMPHS ABS: 0.5 10*3/uL — AB (ref 0.7–4.0)
LYMPHS PCT: 15 %
MCH: 30 pg (ref 26.0–34.0)
MCHC: 32.2 g/dL (ref 30.0–36.0)
MCV: 93.4 fL (ref 78.0–100.0)
Monocytes Absolute: 0.4 10*3/uL (ref 0.1–1.0)
Monocytes Relative: 12 %
NEUTROS ABS: 2.2 10*3/uL (ref 1.7–7.7)
NEUTROS PCT: 70 %
Platelets: 101 10*3/uL — ABNORMAL LOW (ref 150–400)
RBC: 3.63 MIL/uL — AB (ref 4.22–5.81)
RDW: 15 % (ref 11.5–15.5)
WBC: 3.1 10*3/uL — AB (ref 4.0–10.5)

## 2016-12-09 LAB — APTT: aPTT: 31 seconds (ref 24–36)

## 2016-12-09 LAB — COMPREHENSIVE METABOLIC PANEL
ALT: 67 U/L — ABNORMAL HIGH (ref 17–63)
ANION GAP: 10 (ref 5–15)
AST: 46 U/L — AB (ref 15–41)
Albumin: 2.8 g/dL — ABNORMAL LOW (ref 3.5–5.0)
Alkaline Phosphatase: 75 U/L (ref 38–126)
BUN: 79 mg/dL — AB (ref 6–20)
CO2: 26 mmol/L (ref 22–32)
Calcium: 9.4 mg/dL (ref 8.9–10.3)
Chloride: 102 mmol/L (ref 101–111)
Creatinine, Ser: 2.14 mg/dL — ABNORMAL HIGH (ref 0.61–1.24)
GFR, EST AFRICAN AMERICAN: 36 mL/min — AB (ref >60)
GFR, EST NON AFRICAN AMERICAN: 31 mL/min — AB (ref >60)
Glucose, Bld: 187 mg/dL — ABNORMAL HIGH (ref 65–99)
POTASSIUM: 5 mmol/L (ref 3.5–5.1)
Sodium: 138 mmol/L (ref 135–145)
TOTAL PROTEIN: 7.3 g/dL (ref 6.5–8.1)
Total Bilirubin: 0.6 mg/dL (ref 0.3–1.2)

## 2016-12-09 LAB — I-STAT CG4 LACTIC ACID, ED
LACTIC ACID, VENOUS: 2.05 mmol/L — AB (ref 0.5–1.9)
LACTIC ACID, VENOUS: 5.13 mmol/L — AB (ref 0.5–1.9)

## 2016-12-09 LAB — URINALYSIS, ROUTINE W REFLEX MICROSCOPIC
Bacteria, UA: NONE SEEN
Bilirubin Urine: NEGATIVE
Glucose, UA: NEGATIVE mg/dL
KETONES UR: NEGATIVE mg/dL
Nitrite: NEGATIVE
PH: 6 (ref 5.0–8.0)
PROTEIN: 100 mg/dL — AB
SQUAMOUS EPITHELIAL / LPF: NONE SEEN
Specific Gravity, Urine: 1.013 (ref 1.005–1.030)

## 2016-12-09 LAB — PROTIME-INR
INR: 1.23
Prothrombin Time: 15.6 seconds — ABNORMAL HIGH (ref 11.4–15.2)

## 2016-12-09 LAB — LACTIC ACID, PLASMA
LACTIC ACID, VENOUS: 1.8 mmol/L (ref 0.5–1.9)
LACTIC ACID, VENOUS: 2.7 mmol/L — AB (ref 0.5–1.9)
LACTIC ACID, VENOUS: 1.4 mmol/L (ref 0.5–1.9)
Lactic Acid, Venous: 2.1 mmol/L (ref 0.5–1.9)

## 2016-12-09 LAB — PROCALCITONIN: Procalcitonin: 0.2 ng/mL

## 2016-12-09 LAB — CORTISOL: Cortisol, Plasma: 22.6 ug/dL

## 2016-12-09 LAB — MRSA PCR SCREENING: MRSA by PCR: NEGATIVE

## 2016-12-09 MED ORDER — PANTOPRAZOLE SODIUM 40 MG PO TBEC
40.0000 mg | DELAYED_RELEASE_TABLET | Freq: Every day | ORAL | Status: DC
  Administered 2016-12-09 – 2016-12-13 (×4): 40 mg via ORAL
  Filled 2016-12-09 (×5): qty 1

## 2016-12-09 MED ORDER — PHENYLEPHRINE HCL 10 MG/ML IJ SOLN
INTRAMUSCULAR | Status: AC
  Filled 2016-12-09: qty 4

## 2016-12-09 MED ORDER — PIPERACILLIN-TAZOBACTAM 3.375 G IVPB
3.3750 g | Freq: Three times a day (TID) | INTRAVENOUS | Status: DC
  Administered 2016-12-09 – 2016-12-13 (×12): 3.375 g via INTRAVENOUS
  Filled 2016-12-09 (×11): qty 50

## 2016-12-09 MED ORDER — VANCOMYCIN HCL IN DEXTROSE 1-5 GM/200ML-% IV SOLN
1000.0000 mg | INTRAVENOUS | Status: DC
  Administered 2016-12-10 – 2016-12-13 (×4): 1000 mg via INTRAVENOUS
  Filled 2016-12-09 (×4): qty 200

## 2016-12-09 MED ORDER — ACETAMINOPHEN 325 MG PO TABS: 650.0000 mg | ORAL_TABLET | Freq: Four times a day (QID) | ORAL | Status: DC | PRN

## 2016-12-09 MED ORDER — SODIUM CHLORIDE 0.9 % IV BOLUS (SEPSIS)
1000.0000 mL | INTRAVENOUS | Status: AC
  Administered 2016-12-09 (×2): 1000 mL via INTRAVENOUS

## 2016-12-09 MED ORDER — ONDANSETRON HCL 4 MG PO TABS
4.0000 mg | ORAL_TABLET | Freq: Four times a day (QID) | ORAL | Status: DC | PRN
Start: 2016-12-09 — End: 2016-12-13

## 2016-12-09 MED ORDER — SODIUM CHLORIDE 0.9 % IV SOLN
0.0000 ug/min | INTRAVENOUS | Status: DC
  Administered 2016-12-09: 90 ug/min via INTRAVENOUS
  Administered 2016-12-10: 100 ug/min via INTRAVENOUS
  Administered 2016-12-10: 40 ug/min via INTRAVENOUS
  Filled 2016-12-09 (×2): qty 4

## 2016-12-09 MED ORDER — ONDANSETRON HCL 4 MG/2ML IJ SOLN: 4.0000 mg | Freq: Four times a day (QID) | INTRAMUSCULAR | Status: DC | PRN

## 2016-12-09 MED ORDER — EMTRICITABINE-TENOFOVIR AF 200-25 MG PO TABS
1.0000 | ORAL_TABLET | Freq: Every day | ORAL | Status: DC
  Administered 2016-12-10 – 2016-12-13 (×4): 1 via ORAL
  Filled 2016-12-09 (×5): qty 1

## 2016-12-09 MED ORDER — SODIUM CHLORIDE 0.9 % IV BOLUS (SEPSIS)
500.0000 mL | Freq: Once | INTRAVENOUS | Status: AC
  Administered 2016-12-09: 500 mL via INTRAVENOUS

## 2016-12-09 MED ORDER — ASPIRIN 325 MG PO TABS: 325.0000 mg | ORAL_TABLET | Freq: Every day | ORAL | Status: DC

## 2016-12-09 MED ORDER — ATOVAQUONE 750 MG/5ML PO SUSP
1500.0000 mg | Freq: Every day | ORAL | Status: DC
  Administered 2016-12-10 – 2016-12-13 (×4): 1500 mg via ORAL
  Filled 2016-12-09 (×5): qty 10

## 2016-12-09 MED ORDER — SODIUM CHLORIDE 0.9 % IV SOLN
INTRAVENOUS | Status: DC
  Administered 2016-12-09 – 2016-12-10 (×3): via INTRAVENOUS
  Administered 2016-12-11: 1000 mL via INTRAVENOUS
  Administered 2016-12-12 – 2016-12-13 (×2): via INTRAVENOUS

## 2016-12-09 MED ORDER — VANCOMYCIN HCL IN DEXTROSE 1-5 GM/200ML-% IV SOLN: 1000.0000 mg | Freq: Once | INTRAVENOUS | Status: DC

## 2016-12-09 MED ORDER — ASPIRIN EC 325 MG PO TBEC
325.0000 mg | DELAYED_RELEASE_TABLET | Freq: Every day | ORAL | Status: DC
  Administered 2016-12-12 – 2016-12-13 (×2): 325 mg via ORAL
  Filled 2016-12-09 (×2): qty 1

## 2016-12-09 MED ORDER — PIPERACILLIN-TAZOBACTAM 3.375 G IVPB 30 MIN: 3.3750 g | Freq: Once | INTRAVENOUS | Status: DC

## 2016-12-09 MED ORDER — ATORVASTATIN CALCIUM 40 MG PO TABS
80.0000 mg | ORAL_TABLET | Freq: Every day | ORAL | Status: DC
  Administered 2016-12-09 – 2016-12-12 (×4): 80 mg via ORAL
  Filled 2016-12-09 (×4): qty 2

## 2016-12-09 MED ORDER — MIRABEGRON ER 25 MG PO TB24
25.0000 mg | ORAL_TABLET | Freq: Every day | ORAL | Status: DC
  Administered 2016-12-10 – 2016-12-13 (×4): 25 mg via ORAL
  Filled 2016-12-09 (×5): qty 1

## 2016-12-09 MED ORDER — PIPERACILLIN-TAZOBACTAM 3.375 G IVPB 30 MIN
3.3750 g | Freq: Once | INTRAVENOUS | Status: AC
  Administered 2016-12-09: 3.375 g via INTRAVENOUS
  Filled 2016-12-09: qty 50

## 2016-12-09 MED ORDER — ACETAMINOPHEN 650 MG RE SUPP: 650.0000 mg | Freq: Four times a day (QID) | RECTAL | Status: DC | PRN

## 2016-12-09 MED ORDER — SODIUM CHLORIDE 0.9 % IV BOLUS (SEPSIS): 1000.0000 mL | Freq: Once | INTRAVENOUS | Status: DC

## 2016-12-09 MED ORDER — VANCOMYCIN HCL IN DEXTROSE 1-5 GM/200ML-% IV SOLN
1000.0000 mg | Freq: Once | INTRAVENOUS | Status: AC
  Administered 2016-12-09: 1000 mg via INTRAVENOUS
  Filled 2016-12-09: qty 200

## 2016-12-09 MED ORDER — HYDROCORTISONE NA SUCCINATE PF 100 MG IJ SOLR
100.0000 mg | Freq: Three times a day (TID) | INTRAMUSCULAR | Status: DC
  Administered 2016-12-09 – 2016-12-11 (×6): 100 mg via INTRAVENOUS
  Filled 2016-12-09 (×6): qty 2

## 2016-12-09 MED ORDER — SODIUM CHLORIDE 0.9 % IV BOLUS (SEPSIS)
1000.0000 mL | Freq: Once | INTRAVENOUS | Status: AC
  Administered 2016-12-09: 1000 mL via INTRAVENOUS

## 2016-12-09 MED ORDER — DOLUTEGRAVIR SODIUM 50 MG PO TABS
50.0000 mg | ORAL_TABLET | Freq: Every day | ORAL | Status: DC
  Administered 2016-12-10 – 2016-12-13 (×4): 50 mg via ORAL
  Filled 2016-12-09 (×5): qty 1

## 2016-12-09 MED ORDER — SODIUM CHLORIDE 0.9 % IV SOLN
0.0000 ug/min | INTRAVENOUS | Status: DC
  Administered 2016-12-09: 20 ug/min via INTRAVENOUS
  Filled 2016-12-09: qty 1

## 2016-12-09 MED ORDER — ENOXAPARIN SODIUM 30 MG/0.3ML ~~LOC~~ SOLN
30.0000 mg | SUBCUTANEOUS | Status: DC
  Administered 2016-12-09 – 2016-12-11 (×3): 30 mg via SUBCUTANEOUS
  Filled 2016-12-09 (×3): qty 0.3

## 2016-12-09 MED ORDER — SODIUM CHLORIDE 0.9 % IV BOLUS (SEPSIS): 500.0000 mL | Freq: Once | INTRAVENOUS | Status: DC

## 2016-12-09 NOTE — ED Notes (Signed)
Call to ICU, RN to call me back

## 2016-12-09 NOTE — Progress Notes (Signed)
Pharmacy Antibiotic Note  Keith Young is a 66 y.o. male admitted on 12/09/2016 with sepsis.  Pharmacy has been consulted for [place] dosing.  Plan:  Vancomycin 1000mg  IV q24h Check trough at steady state Zosyn 3.375gm IV q8h, EID Monitor labs, renal fxn, progress and c/s Deescalate ABX when improved / appropriate.    Height: 5\' 3"  (160 cm) Weight: 103 lb (46.7 kg) IBW/kg (Calculated) : 56.9  Temp (24hrs), Avg:97.5 F (36.4 C), Min:97.5 F (36.4 C), Max:97.5 F (36.4 C)   Recent Labs Lab 12/09/16 1048 12/09/16 1058 12/09/16 1255 12/09/16 1553  WBC  --  3.1*  --   --   CREATININE  --  2.14*  --   --   LATICACIDVEN 2.05* 2.1* 1.8 5.13*    Estimated Creatinine Clearance: 23 mL/min (A) (by C-G formula based on SCr of 2.14 mg/dL (H)).    No Known Allergies  Antimicrobials this admission: Vancomycin 7/13 >>  Zosyn 7/13 >>   Dose adjustments this admission:  Microbiology results:  BCx: pending  UCx: pending   Sputum:    MRSA PCR:   Thank you for allowing pharmacy to be a part of this patient's care.  Hart Robinsons A 12/09/2016 4:31 PM

## 2016-12-09 NOTE — ED Notes (Signed)
Patient transported to X-ray 

## 2016-12-09 NOTE — ED Notes (Signed)
Pt has had very little urine output.  Will notify admitting MD of this as well as vomiting.

## 2016-12-09 NOTE — ED Notes (Signed)
Nelida Meuse (sister) and Jenny Reichmann (brother in law) can be reached [phone]

## 2016-12-09 NOTE — ED Notes (Signed)
Pt had very large BM that filled depend and came out on to chucks.  Pt given bed bath, took sample of stool in case MD would like testing.  Pt cleaned and placed in new depend.  Peri care as penis and cath were in stool.  Skin on buttock WNL

## 2016-12-09 NOTE — ED Notes (Signed)
Lab tech having difficulty getting blood draw.

## 2016-12-09 NOTE — ED Notes (Signed)
Date and time results received: 12/09/16 1140   Test: Lactic Acid Critical Value: 2.1  Name of Provider Notified: Dr. Sabra Heck  Orders Received? Or Actions Taken?: No new orders given at this time.

## 2016-12-09 NOTE — ED Notes (Addendum)
Spoke with Iona Beard, RN [phone])  supervisor at [facility].  He reports pt was gotten up at 0730 and Iona Beard, [place] notice pt was slumped in chair and not responding.

## 2016-12-09 NOTE — ED Provider Notes (Signed)
Pt is a 8 y/io male - from NH - states from NH that he was hypotensive and had altered MS today - on exam pt is unable to talk - severely weak, hypotensive, weak carotid artery pulses, soft abd with mild SP ttp - blood in the urinary catheter - he is able to open mouth and eyes to command but has diffuse symmetrical weakness otherwise.  Vitals:   12/13/16 0329 12/13/16 0629 12/13/16 1000 12/13/16 1419  BP: (!) 139/91 130/90  (!) 149/90  Pulse: 85 71 100 89  Resp: 18 15  19   Temp: 98.2 F (36.8 C) 98 F (36.7 C)  98.6 F (37 C)  TempSrc: Oral Oral  Oral  SpO2: 99% 100%  100%  Weight:      Height:        Has severe hypotension - suspect shock from sepsis, could also be cardiac but lungs are clear.  Labs, fluids, lactic acid, antibiotics for unknown source, 2 IV's.  Pt is full code per nursing staff.  EKG performed on 12/09/2016 at 10:29 AM shows normal sinus rhythm, left axis, left anterior fascicular block, intraventricular conduction delay, T-wave inversion in the lateral leads as well as the lateral precordial leads similar to February of this year. Abnormal EKG  CRITICAL CARE Performed by: Johnna Acosta Total critical care time: 35 minutes Critical care time was exclusive of separately billable procedures and treating other patients. Critical care was necessary to treat or prevent imminent or life-threatening deterioration. Critical care was time spent personally by me on the following activities: development of treatment plan with patient and/or surrogate as well as nursing, discussions with consultants, evaluation of patient's response to treatment, examination of patient, obtaining history from patient or surrogate, ordering and performing treatments and interventions, ordering and review of laboratory studies, ordering and review of radiographic studies, pulse oximetry and re-evaluation of patient's condition.  Medical screening examination/treatment/procedure(s) were  conducted as a shared visit with non-physician practitioner(s) and myself.  I personally evaluated the patient during the encounter.  Clinical Impression:   Final diagnoses:  Sepsis, due to unspecified organism [facility])  Urinary tract infection with hematuria, site unspecified         Noemi Chapel, MD 12/23/16 1256

## 2016-12-09 NOTE — ED Triage Notes (Signed)
EMS reports, NH says SATS were in 80's and pt was placed on o2 @2l /m.  SATS increased to 98%,, BP was 95/65 and CBG 213 at [facility].  EMS reports that family was notified by NH.  #18g jelco to Left fFA.  #18g foley was changed last night by [facility] staff and noted to have blood-tingled urine this am.  Pt at this time is oriented to person, place and time.

## 2016-12-09 NOTE — ED Provider Notes (Signed)
[facility] DEPT Provider Note   CSN: [id number] Arrival date & time: 12/09/16  0945     History   Chief Complaint Chief Complaint  Patient presents with  . Altered Mental Status    HPI Keith Young is a 66 y.o. male.  The history is provided by the patient. No language interpreter was used.  Altered Mental Status   This is a new problem. The current episode started 3 to 5 hours ago. The problem has not changed since onset.Associated symptoms include weakness. His past medical history does not include seizures.   Pt reports he feels bad today.  Pt sent here from [facility] center due to pt being slumped over in a chair.  Pt less responsive than normal.   Past Medical History:  Diagnosis Date  . AICD (automatic cardioverter/defibrillator) present   . Alcoholism in remission ([place])   . Anemia   . Aortic insufficiency   . Arthritis   . At moderate risk for fall   . Bell's palsy   . Cardiomyopathy ([place])   . Chronic systolic heart failure (HCC)    NYHA Class III  . CKD (chronic kidney disease) stage 3, GFR 30-59 ml/min   . COPD (chronic obstructive pulmonary disease) ([place])   . Essential hypertension, benign   . HIV disease ([place]) 09/06/2016  . Hyperlipidemia   . Noncompliance with medication regimen   . Nonischemic cardiomyopathy (HCC)    EF 15-20%  . NSVT (nonsustained ventricular tachycardia) ([place])   . Numbness of right jaw    Had a stoke in 01/2013. Numbness is occasional, especially when trying to chew.  . Productive cough 08/2013   With brown sputum   . SOB (shortness of breath) on exertion 08/2013  . Stroke ([place]) 01/2013   weakness of right side from CVA  . Type 2 diabetes mellitus ([place])   . Uses hearing aid 2014   recently received new hearing aids    Patient Active Problem List   Diagnosis Date Noted  . At risk for opportunistic infections 11/29/2016  . COPD (chronic obstructive pulmonary disease) ([place]) 09/12/2016  . Bacteremia 09/12/2016  . Encounter for  hospice care discussion   . Protein-calorie malnutrition, severe 09/06/2016  . HIV disease ([place]) 09/06/2016  . Palliative care encounter   . Goals of care, counseling/discussion   . DNR (do not resuscitate) discussion   . AKI (acute kidney injury) ([place]) 09/04/2016  . Malnutrition of moderate degree 06/28/2016  . Altered mental status 06/26/2016  . CVA (cerebral vascular accident) ([place]) 06/26/2016  . Severe malnutrition ([place]) 06/26/2016  . HCAP (healthcare-associated pneumonia) 06/04/2016  . Mucosal abnormality of stomach   . Gastritis and gastroduodenitis 09/30/2015  . Heme positive stool 09/30/2015  . Pancytopenia ([place]) 09/30/2015  . Chest pain 04/28/2015  . Elevated troponin 04/28/2015  . ICD (implantable cardioverter-defibrillator) in place 01/22/2015  . Anemia 09/30/2014  . Gastric polyp   . Reflux esophagitis   . Elevated LFTs 03/26/2014  . Anemia in chronic kidney disease 03/26/2014  . Carotid bruit 12/18/2013  . Acute on chronic systolic heart failure ([place]) 10/24/2013  . CHF (congestive heart failure), NYHA class III ([place]) 10/23/2013  . Cough 10/23/2013  . CKD (chronic kidney disease) 10/23/2013  . Diabetes mellitus type 2 in nonobese ([place]) 10/23/2013  . Essential hypertension, benign 10/23/2013  . Hyperkalemia 10/23/2013  . CAD (coronary artery disease) 10/23/2013  . History of stroke 10/23/2013  . Anemia of chronic kidney failure 10/23/2013  . Other and unspecified  hyperlipidemia 10/23/2013    Past Surgical History:  Procedure Laterality Date  . BIOPSY N/A 04/10/2014   Procedure: GASTRIC BIOPSY;  Surgeon: Daneil Dolin, MD;  Location: [facility] ORS;  Service: Endoscopy;  Laterality: N/A;  . BIOPSY  10/12/2015   Procedure: BIOPSY;  Surgeon: Daneil Dolin, MD;  Location: [facility] ENDO SUITE;  Service: Endoscopy;;  stomach bx's  . CARDIAC DEFIBRILLATOR PLACEMENT  11/12/12   [employer] Inogen MINI ICD implanted in [place] at [place] AVR per Dr Nelly Laurence note  . COLONOSCOPY WITH PROPOFOL N/A 04/10/2014   RMR: Colonic Diverticulosis  . ESOPHAGOGASTRODUODENOSCOPY (EGD) WITH PROPOFOL N/A 04/10/2014   RMR: Mild erosive reflux esophagitis. Multiple antral polyps likely hyperplastic status post removal by hot snare cautery technique. Diffusely abnormal stomach status post gastric biopsy I suspect some of patients anemaia may be due to intermittent oozing from the stomach. It would be difficult and a risky proposition to attempt complete removal of all of his gastric polyps.  . ESOPHAGOGASTRODUODENOSCOPY (EGD) WITH PROPOFOL N/A 10/12/2015   Procedure: ESOPHAGOGASTRODUODENOSCOPY (EGD) WITH PROPOFOL;  Surgeon: Daneil Dolin, MD;  Location: [facility] ENDO SUITE;  Service: Endoscopy;  Laterality: N/A;  5397 - moved to 9:15  . POLYPECTOMY N/A 04/10/2014   Procedure: GASTRIC POLYPECTOMY;  Surgeon: Daneil Dolin, MD;  Location: [facility] ORS;  Service: Endoscopy;  Laterality: N/A;  . RIGHT HEART CATHETERIZATION N/A 02/06/2014   Procedure: RIGHT HEART CATH;  Surgeon: Larey Dresser, MD;  Location: [facility] CATH LAB;  Service: Cardiovascular;  Laterality: N/A;       Home Medications    Prior to Admission medications   Medication Sig Start Date End Date Taking? Authorizing Provider  acetaminophen (TYLENOL) 325 MG tablet Take 2 tablets (650 mg total) by mouth every 4 (four) hours as needed for mild pain (or temp > 37.5 C (99.5 F)). 06/30/16  Yes Sinda Du, MD  aspirin 325 MG tablet Take 1 tablet (325 mg total) by mouth daily. Patient taking differently: Take 325 mg by mouth daily. Delayed release tablet 06/30/16  Yes Sinda Du, MD  atorvastatin (LIPITOR) 80 MG tablet Take 80 mg by mouth daily.  11/22/16  Yes [provider]  atovaquone (MEPRON) 750 MG/5ML suspension Take 10 mLs (1,500 mg total) by mouth daily with breakfast. 09/19/16  Yes Comer, Okey Regal, MD  carvedilol (COREG) 3.125 MG tablet Take 1 tablet (3.125 mg total) by  mouth 2 (two) times daily with a meal. 09/12/16  Yes Sinda Du, MD  Cholecalciferol (VITAMIN D3) 2000 UNITS TABS Take 2,000 mg by mouth daily.   Yes [provider]  digoxin (LANOXIN) 0.125 MG tablet TAKE 1/2 TABLET BY MOUTH DAILY 07/01/16  Yes Bensimhon, Shaune Pascal, MD  dolutegravir (TIVICAY) 50 MG tablet Take 1 tablet (50 mg total) by mouth daily. 09/13/16  Yes Sinda Du, MD  emtricitabine-tenofovir AF (DESCOVY) 200-25 MG tablet Take 1 tablet by mouth daily. 09/12/16  Yes Sinda Du, MD  feeding supplement, ENSURE ENLIVE, (ENSURE ENLIVE) LIQD Take 237 mLs by mouth 2 (two) times daily between meals. Patient taking differently: Take 237 mLs by mouth 3 (three) times daily between meals.  06/30/16  Yes Sinda Du, MD  ferrous sulfate 325 (65 FE) MG tablet Take 325 mg by mouth daily with breakfast.   Yes [provider]  isosorbide dinitrate (ISORDIL) 10 MG tablet Take 10 mg by mouth 3 (three) times daily.  08/08/16  Yes [provider]  mirabegron ER (MYRBETRIQ) 25 MG TB24 tablet Take 25 mg by mouth daily.   Yes [provider]  Multiple Vitamin (MULTIVITAMIN WITH MINERALS) TABS tablet Take 1 tablet by mouth daily. 06/30/16  Yes Sinda Du, MD  dronabinol (MARINOL) 2.5 MG capsule Take 1 capsule (2.5 mg total) by mouth 2 (two) times daily before lunch and supper. Patient not taking: Reported on 12/09/2016 09/12/16   Sinda Du, MD  pantoprazole (PROTONIX) 40 MG tablet TAKE ONE TABLET   BY MOUTH   DAILY 05/25/16   Mahala Menghini, PA-C    Family History Family History  Problem Relation Age of Onset  . Kidney disease Mother   . Kidney disease Sister   . Colon cancer Neg Hx     Social History Social History  Substance Use Topics  . Smoking status: Former Smoker    Types: Cigarettes    Quit date: 11/05/1993  . Smokeless tobacco: Never Used  . Alcohol use Yes     Comment: former heavy ETOH, none recently     Allergies   Patient has no  known allergies.   Review of Systems Review of Systems  Neurological: Positive for weakness.  All other systems reviewed and are negative.    Physical Exam Updated Vital Signs BP 105/69   Pulse 68   Temp (!) 97.5 F (36.4 C) (Oral)   Resp 14   Ht 5\' 3"  (1.6 m)   Wt 46.7 kg (103 lb)   SpO2 100%   BMI 18.25 kg/m   Physical Exam  Constitutional: He appears well-developed and well-nourished.  HENT:  Head: Normocephalic.  Cardiovascular: Normal rate.   Pulmonary/Chest: Effort normal.  Abdominal: Soft. There is no tenderness.  Genitourinary:  Genitourinary Comments: Bloody urine   Musculoskeletal: Normal range of motion.  Neurological: He is alert.  Skin: Skin is warm.  Psychiatric: He has a normal mood and affect.  Nursing note and vitals reviewed.    ED Treatments / Results  Labs (all labs ordered are listed, but only abnormal results are displayed) Labs Reviewed  COMPREHENSIVE METABOLIC PANEL - Abnormal; Notable for the following:       Result Value   Glucose, Bld 187 (*)    BUN 79 (*)    Creatinine, Ser 2.14 (*)    Albumin 2.8 (*)    AST 46 (*)    ALT 67 (*)    GFR calc non Af Amer 31 (*)    GFR calc Af Amer 36 (*)    All other components within normal limits  CBC WITH DIFFERENTIAL/PLATELET - Abnormal; Notable for the following:    WBC 3.1 (*)    RBC 3.63 (*)    Hemoglobin 10.9 (*)    HCT 33.9 (*)    Platelets 101 (*)    Lymphs Abs 0.5 (*)    All other components within normal limits  URINALYSIS, ROUTINE W REFLEX MICROSCOPIC - Abnormal; Notable for the following:    Color, Urine AMBER (*)    APPearance TURBID (*)    Hgb urine dipstick LARGE (*)    Protein, ur 100 (*)    Leukocytes, UA MODERATE (*)    Crystals PRESENT (*)    All other components within normal limits  LACTIC ACID, PLASMA - Abnormal; Notable for the following:    Lactic Acid, Venous 2.1 (*)    All other components within normal limits  I-STAT CG4 LACTIC ACID, ED - Abnormal; Notable  for the following:  Lactic Acid, Venous 2.05 (*)    All other components within normal limits  CULTURE, BLOOD (ROUTINE X 2)  CULTURE, BLOOD (ROUTINE X 2)  LACTIC ACID, PLASMA    EKG  EKG Interpretation None       Radiology Dg Chest 2 View  Result Date: 12/09/2016 CLINICAL DATA:  Sepsis EXAM: CHEST  2 VIEW COMPARISON:  09/05/2016 FINDINGS: Left AICD remains in place, unchanged. Prior median sternotomy. Mild cardiomegaly. Lungs are clear. No effusions or acute bony abnormality. IMPRESSION: Cardiomegaly.  No active disease. Electronically Signed   By: Rolm Baptise M.D.   On: 12/09/2016 11:30    Procedures Procedures (including critical care time)  Medications Ordered in ED Medications  vancomycin (VANCOCIN) IVPB 1000 mg/200 mL premix (1,000 mg Intravenous New Bag/Given 12/09/16 1211)  sodium chloride 0.9 % bolus 1,000 mL (0 mLs Intravenous Stopped 12/09/16 1117)    And  sodium chloride 0.9 % bolus 500 mL (0 mLs Intravenous Stopped 12/09/16 1209)  piperacillin-tazobactam (ZOSYN) IVPB 3.375 g (0 g Intravenous Stopped 12/09/16 1209)     Initial Impression / Assessment and Plan / ED Course  I have reviewed the triage vital signs and the nursing notes.  Pertinent labs & imaging results that were available during my care of the patient were reviewed by me and considered in my medical decision making (see chart for details).     Pt has low blood pressure.  Sepsis protocol initiated.  Pt given Iv fluids, blood pressure improved.  IV antibiotics given  Final Clinical Impressions(s) / ED Diagnoses   Final diagnoses:  Sepsis, due to unspecified organism [facility])  Urinary tract infection with hematuria, site unspecified    New Prescriptions New Prescriptions   No medications on file  I spoke to Dr. Roderic Palau who will admit.     Fransico Meadow, [place] 12/09/16 1349    Noemi Chapel, MD 12/23/16 1256

## 2016-12-09 NOTE — H&P (Signed)
History and Physical    Keith Young HYW:[id number] DOB: Oct 30, 1950 DOA: 12/09/2016  PCP: Sinda Du, MD  Patient coming from: SNF  I have personally briefly reviewed patient's old medical records in [facility]  Chief Complaint: altered mental status  HPI: Keith Young is a 66 y.o. male with medical history significant of . Chronic combined CHF, chronic kidney disease stage III, HIV/AIDS, resident of a skilled nursing facility. Patient was brought to the emergency room today when he was noted to be increasingly lethargic. He was noted to be hypotensive on arrival. Patient is mildly confused at this time, but he is awake. He denies any shortness of breath, vomiting, diarrhea. Foley catheter in place has blood-tinged urine which was apparently changed at the nursing home yesterday. Chest x-ray does not show any evidence of pneumonia. Urine analysis indicates possible infection. Blood pressure noted to be in the 60s with elevated lactic acid. He was started on IV hydration and intravenous antibiotics for possible sepsis. He's been referred for admission.  Review of Systems: As per HPI otherwise 10 point review of systems negative.    Past Medical History:  Diagnosis Date  . AICD (automatic cardioverter/defibrillator) present   . Alcoholism in remission ([place])   . Anemia   . Aortic insufficiency   . Arthritis   . At moderate risk for fall   . Bell's palsy   . Cardiomyopathy ([place])   . Chronic systolic heart failure (HCC)    NYHA Class III  . CKD (chronic kidney disease) stage 3, GFR 30-59 ml/min   . COPD (chronic obstructive pulmonary disease) ([place])   . Essential hypertension, benign   . HIV disease ([place]) 09/06/2016  . Hyperlipidemia   . Noncompliance with medication regimen   . Nonischemic cardiomyopathy (HCC)    EF 15-20%  . NSVT (nonsustained ventricular tachycardia) ([place])   . Numbness of right jaw    Had a stoke in 01/2013. Numbness is occasional, especially when  trying to chew.  . Productive cough 08/2013   With brown sputum   . SOB (shortness of breath) on exertion 08/2013  . Stroke ([place]) 01/2013   weakness of right side from CVA  . Type 2 diabetes mellitus ([place])   . Uses hearing aid 2014   recently received new hearing aids    Past Surgical History:  Procedure Laterality Date  . BIOPSY N/A 04/10/2014   Procedure: GASTRIC BIOPSY;  Surgeon: Daneil Dolin, MD;  Location: [facility];  Service: Endoscopy;  Laterality: N/A;  . BIOPSY  10/12/2015   Procedure: BIOPSY;  Surgeon: Daneil Dolin, MD;  Location: [facility];  Service: Endoscopy;;  stomach bx's  . CARDIAC DEFIBRILLATOR PLACEMENT  11/12/12   [employer] Inogen MINI ICD implanted in [place] at [facility] per Dr Nelly Laurence note  . COLONOSCOPY WITH PROPOFOL N/A 04/10/2014   RMR: Colonic Diverticulosis  . ESOPHAGOGASTRODUODENOSCOPY (EGD) WITH PROPOFOL N/A 04/10/2014   RMR: Mild erosive reflux esophagitis. Multiple antral polyps likely hyperplastic status post removal by hot snare cautery technique. Diffusely abnormal stomach status post gastric biopsy I suspect some of patients anemaia may be due to intermittent oozing from the stomach. It would be difficult and a risky proposition to attempt complete removal of all of his gastric polyps.  . ESOPHAGOGASTRODUODENOSCOPY (EGD) WITH PROPOFOL N/A 10/12/2015   Procedure: ESOPHAGOGASTRODUODENOSCOPY (EGD) WITH PROPOFOL;  Surgeon: Daneil Dolin, MD;  Location: [facility];  Service: Endoscopy;  Laterality: N/A;  1017 - moved to 9:15  . POLYPECTOMY N/A 04/10/2014   Procedure: GASTRIC POLYPECTOMY;  Surgeon: Daneil Dolin, MD;  Location: [facility];  Service: Endoscopy;  Laterality: N/A;  . RIGHT HEART CATHETERIZATION N/A 02/06/2014   Procedure: RIGHT HEART CATH;  Surgeon: Larey Dresser, MD;  Location: [facility] CATH LAB;  Service: Cardiovascular;  Laterality: N/A;     reports that he quit smoking about 23  years ago. His smoking use included Cigarettes. He has never used smokeless tobacco. He reports that he drinks alcohol. He reports that he does not use drugs.  No Known Allergies  Family History  Problem Relation Age of Onset  . Kidney disease Mother   . Kidney disease Sister   . Colon cancer Neg Hx      Prior to Admission medications   Medication Sig Start Date End Date Taking? Authorizing Provider  acetaminophen (TYLENOL) 325 MG tablet Take 2 tablets (650 mg total) by mouth every 4 (four) hours as needed for mild pain (or temp > 37.5 C (99.5 F)). 06/30/16  Yes Sinda Du, MD  aspirin 325 MG tablet Take 1 tablet (325 mg total) by mouth daily. Patient taking differently: Take 325 mg by mouth daily. Delayed release tablet 06/30/16  Yes Sinda Du, MD  atorvastatin (LIPITOR) 80 MG tablet Take 80 mg by mouth daily.  11/22/16  Yes [provider]  atovaquone (MEPRON) 750 MG/5ML suspension Take 10 mLs (1,500 mg total) by mouth daily with breakfast. 09/19/16  Yes Comer, Okey Regal, MD  carvedilol (COREG) 3.125 MG tablet Take 1 tablet (3.125 mg total) by mouth 2 (two) times daily with a meal. 09/12/16  Yes Sinda Du, MD  Cholecalciferol (VITAMIN D3) 2000 UNITS TABS Take 2,000 mg by mouth daily.   Yes [provider]  digoxin (LANOXIN) 0.125 MG tablet TAKE 1/2 TABLET BY MOUTH DAILY 07/01/16  Yes Bensimhon, Shaune Pascal, MD  dolutegravir (TIVICAY) 50 MG tablet Take 1 tablet (50 mg total) by mouth daily. 09/13/16  Yes Sinda Du, MD  emtricitabine-tenofovir AF (DESCOVY) 200-25 MG tablet Take 1 tablet by mouth daily. 09/12/16  Yes Sinda Du, MD  feeding supplement, ENSURE ENLIVE, (ENSURE ENLIVE) LIQD Take 237 mLs by mouth 2 (two) times daily between meals. Patient taking differently: Take 237 mLs by mouth 3 (three) times daily between meals.  06/30/16  Yes Sinda Du, MD  ferrous sulfate 325 (65 FE) MG tablet Take 325 mg by mouth daily with breakfast.   Yes [provider]  isosorbide dinitrate (ISORDIL) 10 MG tablet Take 10 mg by mouth 3 (three) times daily.  08/08/16  Yes [provider]  mirabegron ER (MYRBETRIQ) 25 MG TB24 tablet Take 25 mg by mouth daily.   Yes [provider]  Multiple Vitamin (MULTIVITAMIN WITH MINERALS) TABS tablet Take 1 tablet by mouth daily. 06/30/16  Yes Sinda Du, MD  dronabinol (MARINOL) 2.5 MG capsule Take 1 capsule (2.5 mg total) by mouth 2 (two) times daily before lunch and supper. Patient not taking: Reported on 12/09/2016 09/12/16   Sinda Du, MD  pantoprazole (PROTONIX) 40 MG tablet TAKE ONE TABLET   BY MOUTH   DAILY 05/25/16   Mahala Menghini, PA-C    Physical Exam: Vitals:   12/09/16 1430 12/09/16 1500 12/09/16 1600 12/09/16 1717  BP: 91/73 (!) 62/47 (!) 71/55   Pulse: 82 80 (!) 102   Resp: 16 (!) 22 (!) 21   Temp:  97.8 F (36.6 C)  TempSrc:    Oral  SpO2: 97% 98% 91%   Weight:    53.2 kg (117 lb 4.6 oz)  Height:    5\' 4"  (1.626 m)    Constitutional: NAD, calm, comfortable Vitals:   12/09/16 1430 12/09/16 1500 12/09/16 1600 12/09/16 1717  BP: 91/73 (!) 62/47 (!) 71/55   Pulse: 82 80 (!) 102   Resp: 16 (!) 22 (!) 21   Temp:    97.8 F (36.6 C)  TempSrc:    Oral  SpO2: 97% 98% 91%   Weight:    53.2 kg (117 lb 4.6 oz)  Height:    5\' 4"  (1.626 m)   Eyes: PERRL, lids and conjunctivae normal ENMT: Mucous membranes are dry. Posterior pharynx clear of any exudate or lesions.Normal dentition.  Neck: normal, supple, no masses, no thyromegaly Respiratory: coarse breath sounds bilaterally that clear with cough. Normal respiratory effort. No accessory muscle use.  Cardiovascular: Regular rate and rhythm, no murmurs / rubs / gallops. No extremity edema. 2+ pedal pulses. No carotid bruits.  Abdomen: no tenderness, no masses palpated. No hepatosplenomegaly. Bowel sounds positive.  Musculoskeletal: no clubbing / cyanosis. No joint deformity upper and lower extremities. Good ROM,  no contractures. Widespread muscle wasting.  Skin: no rashes, lesions, ulcers. No induration Neurologic: CN 2-12 grossly intact. Sensation intact, DTR normal. Strength 5/5 in all 4.  Psychiatric: awake, calm, cooperative    Labs on Admission: I have personally reviewed following labs and imaging studies  CBC:  Recent Labs Lab 12/09/16 1058  WBC 3.1*  NEUTROABS 2.2  HGB 10.9*  HCT 33.9*  MCV 93.4  PLT 767*   Basic Metabolic Panel:  Recent Labs Lab 12/09/16 1058  NA 138  K 5.0  CL 102  CO2 26  GLUCOSE 187*  BUN 79*  CREATININE 2.14*  CALCIUM 9.4   GFR: Estimated Creatinine Clearance: 26.2 mL/min (A) (by C-G formula based on SCr of 2.14 mg/dL (H)). Liver Function Tests:  Recent Labs Lab 12/09/16 1058  AST 46*  ALT 67*  ALKPHOS 75  BILITOT 0.6  PROT 7.3  ALBUMIN 2.8*   No results for input(s): LIPASE, AMYLASE in the last 168 hours. No results for input(s): AMMONIA in the last 168 hours. Coagulation Profile: No results for input(s): INR, PROTIME in the last 168 hours. Cardiac Enzymes: No results for input(s): CKTOTAL, CKMB, CKMBINDEX, TROPONINI in the last 168 hours. BNP (last 3 results) No results for input(s): PROBNP in the last 8760 hours. HbA1C: No results for input(s): HGBA1C in the last 72 hours. CBG: No results for input(s): GLUCAP in the last 168 hours. Lipid Profile: No results for input(s): CHOL, HDL, LDLCALC, TRIG, CHOLHDL, LDLDIRECT in the last 72 hours. Thyroid Function Tests: No results for input(s): TSH, T4TOTAL, FREET4, T3FREE, THYROIDAB in the last 72 hours. Anemia Panel: No results for input(s): VITAMINB12, FOLATE, FERRITIN, TIBC, IRON, RETICCTPCT in the last 72 hours. Urine analysis:    Component Value Date/Time   COLORURINE AMBER (A) 12/09/2016 1008   APPEARANCEUR TURBID (A) 12/09/2016 1008   LABSPEC 1.013 12/09/2016 1008   PHURINE 6.0 12/09/2016 1008   GLUCOSEU NEGATIVE 12/09/2016 1008   HGBUR LARGE (A) 12/09/2016 1008    BILIRUBINUR NEGATIVE 12/09/2016 1008   KETONESUR NEGATIVE 12/09/2016 1008   PROTEINUR 100 (A) 12/09/2016 1008   UROBILINOGEN 0.2 10/23/2013 1545   NITRITE NEGATIVE 12/09/2016 1008   LEUKOCYTESUR MODERATE (A) 12/09/2016 1008    Radiological Exams on Admission: Dg Chest  2 View  Result Date: 12/09/2016 CLINICAL DATA:  Sepsis EXAM: CHEST  2 VIEW COMPARISON:  09/05/2016 FINDINGS: Left AICD remains in place, unchanged. Prior median sternotomy. Mild cardiomegaly. Lungs are clear. No effusions or acute bony abnormality. IMPRESSION: Cardiomegaly.  No active disease. Electronically Signed   By: Rolm Baptise M.D.   On: 12/09/2016 11:30    Assessment/Plan Active Problems:   CKD (chronic kidney disease)   Anemia of chronic kidney failure   Pancytopenia (HCC)   Severe malnutrition (HCC)   AKI (acute kidney injury) ([place])   HIV disease ([place])   Sepsis ([place])   Urinary tract infection with hematuria   Chronic combined systolic and diastolic CHF (congestive heart failure) (HCC)   Severe sepsis with septic shock ([place])    1. Sepsis with possible septic shock. Lactic acid remains elevated. He has received IV fluids bolus per sepsis protocol at 5ml/kg. Blood pressure remains low. Review of records indicates that blood pressure normally runs in the 140s range. We'll continue with hydration. Start on vasopressors. We'll place PICC line. Check cortisol and started on Solu-Cortef. He is on broad-spectrum antibiotics. Blood cultures and urine culture have been sent. Follow lactic acid after hydration. Patient is awake, alert and is able to carry on conversation. 2. Possible urinary tract infection. On intravenous antibiotics. Foley catheter was changed yesterday. Follow-up urine culture. 3. Acute kidney injury on chronic kidney disease stage III. Likely related to volume depletion and sepsis. Continue hydration and recheck labs in a.m. 4. Chronic combined CHF. Ejection fraction of 30%. No signs of decompensation  at this time. Monitor for signs of volume overload. 5. HIV/AIDS. Continue on antiretroviral therapy. 6. Pancytopenia. This appears to be a chronic issue, possibly related to underlying immunodeficiency. Continue to follow. 7. Anemia chronic kidney disease. Hemoglobin is baseline. No indication for transfusion. Continue to follow. 8. Severe malnutrition. Likely related to chronic illness. Nutritional follow-up.  DVT prophylaxis: lovenox Code Status: DNR Family Communication: no family present Disposition Plan: discharge back to SNF when improved Consults called:  Admission status: inpatient, icu  Critical care time: 60 minutes  MEMON,JEHANZEB MD [facility] Pager [phone]  If 7PM-7AM, please contact night-coverage [email or web address] Password [facility]  12/09/2016, 5:35 PM

## 2016-12-10 ENCOUNTER — Inpatient Hospital Stay (HOSPITAL_COMMUNITY): Payer: Medicare HMO

## 2016-12-10 DIAGNOSIS — I360 Nonrheumatic tricuspid (valve) stenosis: Secondary | ICD-10-CM

## 2016-12-10 LAB — COMPREHENSIVE METABOLIC PANEL
ALBUMIN: 2.3 g/dL — AB (ref 3.5–5.0)
ALT: 55 U/L (ref 17–63)
ANION GAP: 6 (ref 5–15)
AST: 56 U/L — AB (ref 15–41)
Alkaline Phosphatase: 55 U/L (ref 38–126)
BUN: 76 mg/dL — AB (ref 6–20)
CO2: 21 mmol/L — AB (ref 22–32)
Calcium: 8.3 mg/dL — ABNORMAL LOW (ref 8.9–10.3)
Chloride: 113 mmol/L — ABNORMAL HIGH (ref 101–111)
Creatinine, Ser: 2.67 mg/dL — ABNORMAL HIGH (ref 0.61–1.24)
GFR calc Af Amer: 27 mL/min — ABNORMAL LOW (ref >60)
GFR calc non Af Amer: 24 mL/min — ABNORMAL LOW (ref >60)
GLUCOSE: 271 mg/dL — AB (ref 65–99)
POTASSIUM: 4.4 mmol/L (ref 3.5–5.1)
SODIUM: 140 mmol/L (ref 135–145)
Total Bilirubin: 0.6 mg/dL (ref 0.3–1.2)
Total Protein: 6.4 g/dL — ABNORMAL LOW (ref 6.5–8.1)

## 2016-12-10 LAB — CBC
HEMATOCRIT: 33 % — AB (ref 39.0–52.0)
HEMOGLOBIN: 10.4 g/dL — AB (ref 13.0–17.0)
MCH: 29.8 pg (ref 26.0–34.0)
MCHC: 31.5 g/dL (ref 30.0–36.0)
MCV: 94.6 fL (ref 78.0–100.0)
Platelets: 127 10*3/uL — ABNORMAL LOW (ref 150–400)
RBC: 3.49 MIL/uL — ABNORMAL LOW (ref 4.22–5.81)
RDW: 15.4 % (ref 11.5–15.5)
WBC: 12.5 10*3/uL — ABNORMAL HIGH (ref 4.0–10.5)

## 2016-12-10 LAB — C DIFFICILE QUICK SCREEN W PCR REFLEX
C DIFFICILE (CDIFF) INTERP: NOT DETECTED
C DIFFICILE (CDIFF) TOXIN: NEGATIVE
C Diff antigen: NEGATIVE

## 2016-12-10 LAB — GLUCOSE, CAPILLARY: GLUCOSE-CAPILLARY: 322 mg/dL — AB (ref 65–99)

## 2016-12-10 LAB — ECHOCARDIOGRAM COMPLETE
Height: 64 in
Weight: 1876.56 oz

## 2016-12-10 MED ORDER — STARCH (THICKENING) PO POWD
ORAL | Status: DC | PRN
Start: 2016-12-10 — End: 2016-12-13
  Filled 2016-12-10: qty 227

## 2016-12-10 MED ORDER — INSULIN ASPART 100 UNIT/ML ~~LOC~~ SOLN
0.0000 [IU] | Freq: Three times a day (TID) | SUBCUTANEOUS | Status: DC
  Administered 2016-12-11: 5 [IU] via SUBCUTANEOUS
  Administered 2016-12-11 – 2016-12-12 (×4): 3 [IU] via SUBCUTANEOUS
  Administered 2016-12-12 – 2016-12-13 (×2): 1 [IU] via SUBCUTANEOUS

## 2016-12-10 MED ORDER — INSULIN ASPART 100 UNIT/ML ~~LOC~~ SOLN
0.0000 [IU] | Freq: Every day | SUBCUTANEOUS | Status: DC
  Administered 2016-12-10: 5 [IU] via SUBCUTANEOUS
  Administered 2016-12-11: 2 [IU] via SUBCUTANEOUS

## 2016-12-10 MED ORDER — ENSURE ENLIVE PO LIQD
237.0000 mL | Freq: Three times a day (TID) | ORAL | Status: DC
  Administered 2016-12-10 – 2016-12-11 (×5): 237 mL via ORAL

## 2016-12-10 MED ORDER — PHENYLEPHRINE HCL 10 MG/ML IJ SOLN
INTRAMUSCULAR | Status: AC
  Filled 2016-12-10: qty 4

## 2016-12-10 NOTE — Progress Notes (Signed)
Subjective: Keith Young was admitted yesterday with hypotension and presumed sepsis. 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 Keith Young Keith on pressors and his blood pressure Keith much better. Keith Young actually has no complaints.  Objective: Vital signs in last 24 hours: Temp:  [97.5 F (36.4 C)-99.8 F (37.7 C)] 98.7 F (37.1 C) (07/14 0800) Pulse Rate:  [57-104] 86 (07/14 0800) Resp:  [12-28] 19 (07/14 0815) BP: (50-136)/(39-103) 133/67 (07/14 0815) SpO2:  [85 %-100 %] 100 % (07/14 0800) Weight:  [46.7 kg (103 lb)-53.2 kg (117 lb 4.6 oz)] 53.2 kg (117 lb 4.6 oz) (07/13 1717) Weight change:  Last BM Date: 12/09/16  Intake/Output from previous day: 07/13 0701 - 07/14 0700 In: 2257.5 [P.O.:240; I.V.:67.5; IV Piggyback:1950] Out: 500 [Urine:500]  PHYSICAL EXAM General appearance: alert and no distress Resp: rhonchi bilaterally Cardio: Heart Keith regular with grade 3 systolic murmur GI: soft, non-tender; bowel sounds normal; no masses,  no organomegaly Extremities: extremities normal, atraumatic, no cyanosis or edema Skin turgor poor  Lab Results:  Results for orders placed or performed during Keith hospital encounter of 12/09/16 (from Keith past 48 hour(s))  Urinalysis, Routine w reflex microscopic     Status: Abnormal   Collection Time: 12/09/16 10:08 AM  Result Value Ref Range   Color, Urine AMBER (A) YELLOW    Comment: BIOCHEMICALS MAY BE AFFECTED BY COLOR   APPearance TURBID (A) CLEAR   Specific Gravity, Urine 1.013 1.005 - 1.030   pH 6.0 5.0 - 8.0   Glucose, UA NEGATIVE NEGATIVE mg/dL   Hgb urine dipstick LARGE (A) NEGATIVE   Bilirubin Urine NEGATIVE NEGATIVE   Ketones, ur NEGATIVE NEGATIVE mg/dL   Protein, ur 100 (A) NEGATIVE mg/dL   Nitrite NEGATIVE NEGATIVE   Leukocytes, UA MODERATE (A) NEGATIVE   RBC / HPF TOO NUMEROUS Young COUNT 0 - 5 RBC/hpf   WBC, UA TOO NUMEROUS Young COUNT 0 - 5  WBC/hpf   Bacteria, UA NONE SEEN NONE SEEN   Squamous Epithelial / LPF NONE SEEN NONE SEEN   Crystals PRESENT (A) NEGATIVE  Blood Culture (routine x 2)     Status: None (Preliminary result)   Collection Time: 12/09/16 10:37 AM  Result Value Ref Range   Specimen Description BLOOD LEFT HAND    Special Requests      BOTTLES DRAWN AEROBIC AND ANAEROBIC Blood Culture results may not be optimal due Young an inadequate volume of blood received in culture bottles   Culture NO GROWTH < 24 HOURS    Report Status PENDING   Blood Culture (routine x 2)     Status: None (Preliminary result)   Collection Time: 12/09/16 10:39 AM  Result Value Ref Range   Specimen Description BLOOD RIGHT HAND    Special Requests      AEROBIC BOTTLE ONLY Blood Culture results may not be optimal due Young an inadequate volume of blood received in culture bottles   Culture NO GROWTH < 24 HOURS    Report Status PENDING   I-Stat CG4 Lactic Acid, ED  (not at  [facility])     Status: Abnormal   Collection Time: 12/09/16 10:48 AM  Result Value Ref Range   Lactic Acid, Venous 2.05 (HH) 0.5 - 1.9 mmol/L  Comprehensive metabolic panel     Status: Abnormal   Collection Time: 12/09/16 10:58 AM  Result Value Ref Range   Sodium 138 135 - 145 mmol/L   Potassium  5.0 3.5 - 5.1 mmol/L   Chloride 102 101 - 111 mmol/L   CO2 26 22 - 32 mmol/L   Glucose, Bld 187 (H) 65 - 99 mg/dL   BUN 79 (H) 6 - 20 mg/dL   Creatinine, Ser 2.14 (H) 0.61 - 1.24 mg/dL   Calcium 9.4 8.9 - 10.3 mg/dL   Total Protein 7.3 6.5 - 8.1 g/dL   Albumin 2.8 (L) 3.5 - 5.0 g/dL   AST 46 (H) 15 - 41 U/L   ALT 67 (H) 17 - 63 U/L   Alkaline Phosphatase 75 38 - 126 U/L   Total Bilirubin 0.6 0.3 - 1.2 mg/dL   GFR calc non Af Amer 31 (L) >60 mL/min   GFR calc Af Amer 36 (L) >60 mL/min    Comment: (Young) Keith eGFR has been calculated using Keith CKD EPI equation. This calculation has not been validated in all clinical situations. eGFR's persistently <60 mL/min signify possible  Chronic Kidney Disease.    Anion gap 10 5 - 15  CBC WITH DIFFERENTIAL     Status: Abnormal   Collection Time: 12/09/16 10:58 AM  Result Value Ref Range   WBC 3.1 (L) 4.0 - 10.5 K/uL   RBC 3.63 (L) 4.22 - 5.81 MIL/uL   Hemoglobin 10.9 (L) 13.0 - 17.0 g/dL   HCT 33.9 (L) 39.0 - 52.0 %   MCV 93.4 78.0 - 100.0 fL   MCH 30.0 26.0 - 34.0 pg   MCHC 32.2 30.0 - 36.0 g/dL   RDW 15.0 11.5 - 15.5 %   Platelets 101 (L) 150 - 400 K/uL    Comment: CONSISTENT WITH PREVIOUS RESULT SPECIMEN CHECKED FOR CLOTS    Neutrophils Relative % 70 %   Neutro Abs 2.2 1.7 - 7.7 K/uL   Lymphocytes Relative 15 %   Lymphs Abs 0.5 (L) 0.7 - 4.0 K/uL   Monocytes Relative 12 %   Monocytes Absolute 0.4 0.1 - 1.0 K/uL   Eosinophils Relative 2 %   Eosinophils Absolute 0.1 0.0 - 0.7 K/uL   Basophils Relative 0 %   Basophils Absolute 0.0 0.0 - 0.1 K/uL  Lactic acid, plasma     Status: Abnormal   Collection Time: 12/09/16 10:58 AM  Result Value Ref Range   Lactic Acid, Venous 2.1 (HH) 0.5 - 1.9 mmol/L    Comment: CRITICAL RESULT CALLED Young, READ BACK BY AND VERIFIED WITH: KENDRICK,J @ 1140 ON 7.13.18 BY BAYSE,L   Lactic acid, plasma     Status: None   Collection Time: 12/09/16 12:55 PM  Result Value Ref Range   Lactic Acid, Venous 1.8 0.5 - 1.9 mmol/L  Procalcitonin     Status: None   Collection Time: 12/09/16 12:55 PM  Result Value Ref Range   Procalcitonin 0.20 ng/mL    Comment:        Interpretation: PCT (Procalcitonin) <= 0.5 ng/mL: Systemic infection (sepsis) Keith not likely. Local bacterial infection Keith possible. (Young)         ICU PCT Algorithm               Non ICU PCT Algorithm    ----------------------------     ------------------------------         PCT < 0.25 ng/mL                 PCT < 0.1 ng/mL     Stopping of antibiotics            Stopping of antibiotics  strongly encouraged.               strongly encouraged.    ----------------------------     ------------------------------        PCT level decrease by               PCT < 0.25 ng/mL       >= 80% from peak PCT       OR PCT 0.25 - 0.5 ng/mL          Stopping of antibiotics                                             encouraged.     Stopping of antibiotics           encouraged.    ----------------------------     ------------------------------       PCT level decrease by              PCT >= 0.25 ng/mL       < 80% from peak PCT        AND PCT >= 0.5 ng/mL            Continuin g antibiotics                                              encouraged.       Continuing antibiotics            encouraged.    ----------------------------     ------------------------------     PCT level increase compared          PCT > 0.5 ng/mL         with peak PCT AND          PCT >= 0.5 ng/mL             Escalation of antibiotics                                          strongly encouraged.      Escalation of antibiotics        strongly encouraged.   Protime-INR     Status: Abnormal   Collection Time: 12/09/16 12:55 PM  Result Value Ref Range   Prothrombin Time 15.6 (H) 11.4 - 15.2 seconds   INR 1.23   APTT     Status: None   Collection Time: 12/09/16 12:55 PM  Result Value Ref Range   aPTT 31 24 - 36 seconds  Cortisol     Status: None   Collection Time: 12/09/16 12:55 PM  Result Value Ref Range   Cortisol, Plasma 22.6 ug/dL    Comment: (Young) AM    6.7 - 22.6 ug/dL PM   <10.0       ug/dL Performed at [facility] [address], [place], [place] [address]   I-Stat CG4 Lactic Acid, ED  (not at  [facility])     Status: Abnormal   Collection Time: 12/09/16  3:53 PM  Result Value Ref Range   Lactic Acid, Venous 5.13 (HH) 0.5 - 1.9 mmol/L  MRSA PCR Screening  Status: None   Collection Time: 12/09/16  4:50 PM  Result Value Ref Range   MRSA by PCR NEGATIVE NEGATIVE    Comment:        Keith GeneXpert MRSA Assay (FDA approved for NASAL specimens only), Keith one component of a comprehensive MRSA colonization surveillance  program. Keith Keith not intended Young diagnose MRSA infection Keith Young guide or monitor treatment for MRSA infections.   Lactic acid, plasma     Status: Abnormal   Collection Time: 12/09/16  7:14 PM  Result Value Ref Range   Lactic Acid, Venous 2.7 (HH) 0.5 - 1.9 mmol/L    Comment: CRITICAL RESULT CALLED Young, READ BACK BY AND VERIFIED WITH: ROBERTS,T ON 12/09/16 AT 2105 BY LOY,C   Lactic acid, plasma     Status: None   Collection Time: 12/09/16 11:00 PM  Result Value Ref Range   Lactic Acid, Venous 1.4 0.5 - 1.9 mmol/L  Comprehensive metabolic panel     Status: Abnormal   Collection Time: 12/10/16  4:37 AM  Result Value Ref Range   Sodium 140 135 - 145 mmol/L   Potassium 4.4 3.5 - 5.1 mmol/L   Chloride 113 (H) 101 - 111 mmol/L   CO2 21 (L) 22 - 32 mmol/L   Glucose, Bld 271 (H) 65 - 99 mg/dL   BUN 76 (H) 6 - 20 mg/dL   Creatinine, Ser 2.67 (H) 0.61 - 1.24 mg/dL   Calcium 8.3 (L) 8.9 - 10.3 mg/dL   Total Protein 6.4 (L) 6.5 - 8.1 g/dL   Albumin 2.3 (L) 3.5 - 5.0 g/dL   AST 56 (H) 15 - 41 U/L   ALT 55 17 - 63 U/L   Alkaline Phosphatase 55 38 - 126 U/L   Total Bilirubin 0.6 0.3 - 1.2 mg/dL   GFR calc non Af Amer 24 (L) >60 mL/min   GFR calc Af Amer 27 (L) >60 mL/min    Comment: (Young) Keith eGFR has been calculated using Keith CKD EPI equation. This calculation has not been validated in all clinical situations. eGFR's persistently <60 mL/min signify possible Chronic Kidney Disease.    Anion gap 6 5 - 15  CBC     Status: Abnormal   Collection Time: 12/10/16  4:37 AM  Result Value Ref Range   WBC 12.5 (H) 4.0 - 10.5 K/uL   RBC 3.49 (L) 4.22 - 5.81 MIL/uL   Hemoglobin 10.4 (L) 13.0 - 17.0 g/dL   HCT 33.0 (L) 39.0 - 52.0 %   MCV 94.6 78.0 - 100.0 fL   MCH 29.8 26.0 - 34.0 pg   MCHC 31.5 30.0 - 36.0 g/dL   RDW 15.4 11.5 - 15.5 %   Platelets 127 (L) 150 - 400 K/uL    ABGS No results for input(s): PHART, PO2ART, TCO2, HCO3 in Keith last 72 hours.  Invalid input(s):  PCO2 CULTURES Recent Results (from Keith past 240 hour(s))  Blood Culture (routine x 2)     Status: None (Preliminary result)   Collection Time: 12/09/16 10:37 AM  Result Value Ref Range Status   Specimen Description BLOOD LEFT HAND  Final   Special Requests   Final    BOTTLES DRAWN AEROBIC AND ANAEROBIC Blood Culture results may not be optimal due Young an inadequate volume of blood received in culture bottles   Culture NO GROWTH < 24 HOURS  Final   Report Status PENDING  Incomplete  Blood Culture (routine x 2)     Status: None (Preliminary  result)   Collection Time: 12/09/16 10:39 AM  Result Value Ref Range Status   Specimen Description BLOOD RIGHT HAND  Final   Special Requests   Final    AEROBIC BOTTLE ONLY Blood Culture results may not be optimal due Young an inadequate volume of blood received in culture bottles   Culture NO GROWTH < 24 HOURS  Final   Report Status PENDING  Incomplete  MRSA PCR Screening     Status: None   Collection Time: 12/09/16  4:50 PM  Result Value Ref Range Status   MRSA by PCR NEGATIVE NEGATIVE Final    Comment:        Keith GeneXpert MRSA Assay (FDA approved for NASAL specimens only), Keith one component of a comprehensive MRSA colonization surveillance program. Keith Keith not intended Young diagnose MRSA infection Keith Young guide or monitor treatment for MRSA infections.    Studies/Results: Dg Chest 2 View  Result Date: 12/09/2016 CLINICAL DATA:  Sepsis EXAM: CHEST  2 VIEW COMPARISON:  09/05/2016 FINDINGS: Left AICD remains in place, unchanged. Prior median sternotomy. Mild cardiomegaly. Lungs are clear. No effusions or acute bony abnormality. IMPRESSION: Cardiomegaly.  No active disease. Electronically Signed   By: Rolm Baptise M.D.   On: 12/09/2016 11:30    Medications:  Prior Young Admission:  Prescriptions Prior Young Admission  Medication Sig Dispense Refill Last Dose  . acetaminophen (TYLENOL) 325 MG tablet Take 2 tablets (650 mg total) by mouth every 4 (four)  hours as needed for mild pain (or temp > 37.5 C (99.5 F)).   Taking  . aspirin 325 MG tablet Take 1 tablet (325 mg total) by mouth daily. (Patient taking differently: Take 325 mg by mouth daily. Delayed release tablet)   12/09/2016 at Unknown time  . atorvastatin (LIPITOR) 80 MG tablet Take 80 mg by mouth daily.    12/08/2016 at Unknown time  . atovaquone (MEPRON) 750 MG/5ML suspension Take 10 mLs (1,500 mg total) by mouth daily with breakfast. 300 mL 5 12/09/2016 at Unknown time  . carvedilol (COREG) 3.125 MG tablet Take 1 tablet (3.125 mg total) by mouth 2 (two) times daily with a meal. 60 tablet 12 12/08/2016 at Unknown time  . Cholecalciferol (VITAMIN D3) 2000 UNITS TABS Take 2,000 mg by mouth daily.   12/09/2016 at Unknown time  . digoxin (LANOXIN) 0.125 MG tablet TAKE 1/2 TABLET BY MOUTH DAILY 15 tablet 3 12/09/2016 at Unknown time  . dolutegravir (TIVICAY) 50 MG tablet Take 1 tablet (50 mg total) by mouth daily. 30 tablet  12/09/2016 at Unknown time  . emtricitabine-tenofovir AF (DESCOVY) 200-25 MG tablet Take 1 tablet by mouth daily. 30 tablet  12/09/2016 at Unknown time  . feeding supplement, ENSURE ENLIVE, (ENSURE ENLIVE) LIQD Take 237 mLs by mouth 2 (two) times daily between meals. (Patient taking differently: Take 237 mLs by mouth 3 (three) times daily between meals. ) 237 mL 12 12/08/2016 at Unknown time  . ferrous sulfate 325 (65 FE) MG tablet Take 325 mg by mouth daily with breakfast.   12/09/2016 at Unknown time  . isosorbide dinitrate (ISORDIL) 10 MG tablet Take 10 mg by mouth 3 (three) times daily.    12/09/2016 at Unknown time  . mirabegron ER (MYRBETRIQ) 25 MG TB24 tablet Take 25 mg by mouth daily.   12/09/2016 at Unknown time  . Multiple Vitamin (MULTIVITAMIN WITH MINERALS) TABS tablet Take 1 tablet by mouth daily.   12/09/2016 at Unknown time  . dronabinol (MARINOL) 2.5 MG capsule  Take 1 capsule (2.5 mg total) by mouth 2 (two) times daily before lunch and supper. (Patient not taking: Reported  on 12/09/2016)   Not Taking at Unknown time  . pantoprazole (PROTONIX) 40 MG tablet TAKE ONE TABLET   BY MOUTH   DAILY 30 tablet 11 Taking   Scheduled: . aspirin EC  325 mg Oral Daily  . atorvastatin  80 mg Oral q1800  . atovaquone  1,500 mg Oral Q breakfast  . dolutegravir  50 mg Oral Daily  . emtricitabine-tenofovir AF  1 tablet Oral Daily  . enoxaparin (LOVENOX) injection  30 mg Subcutaneous Q24H  . feeding supplement (ENSURE ENLIVE)  237 mL Oral TID BM  . hydrocortisone sod succinate (SOLU-CORTEF) inj  100 mg Intravenous Q8H  . mirabegron ER  25 mg Oral Daily  . pantoprazole  40 mg Oral Daily   Continuous: . sodium chloride 75 mL/hr at 12/10/16 0800  . phenylephrine (NEO-SYNEPHRINE) Adult infusion 90 mcg/min (12/10/16 0818)  . piperacillin-tazobactam (ZOSYN)  IV Stopped (12/10/16 1711)  . sodium chloride     And  . sodium chloride    . vancomycin 1,000 mg (12/10/16 0500)   OHC:[id number] **OR** acetaminophen, ondansetron **OR** ondansetron (ZOFRAN) IV  Assesment: Keith Young was admitted with sepsis. Keith Young has urinary tract infection as Keith source Keith appears. Keith Young Keith on pressors. 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 Keith Young Keith improving but Keith Young Keith requiring pressors. Keith Young Keith on appropriate antibiotics. No evidence of acute heart failure now. Active Problems:   CKD (chronic kidney disease)   Anemia of chronic kidney failure   Pancytopenia (HCC)   Severe malnutrition (HCC)   AKI (acute kidney injury) ([place])   HIV disease ([place])   Sepsis ([place])   Urinary tract infection with hematuria   Chronic combined systolic and diastolic CHF (congestive heart failure) (HCC)   Severe sepsis with septic shock ([place])    Plan: Continue current treatments repeat echocardiogram Young make sure there Keith not a cardiac component of his hypotension    LOS: 1 day    Tranquilino Fischler L 12/10/2016, 9:28 AM

## 2016-12-10 NOTE — Progress Notes (Signed)
  Echocardiogram 2D Echocardiogram has been performed.  Keith Young 12/10/2016, 1:52 PM

## 2016-12-10 NOTE — Progress Notes (Signed)
Initial Nutrition Assessment  DOCUMENTATION CODES:  Not applicable  INTERVENTION:  Downgrade diet to D1 with NTL as this is patients base diet at SNF  Ensure Enlive po TID, each supplement provides 350 kcal and 20 grams of protein  NUTRITION DIAGNOSIS:  Increased nutrient needs related to acute illness (Sepsis) as evidenced by estimated nutritional requirements for his condition  GOAL:  Patient will meet greater than or equal to 90% of their needs  MONITOR:  PO intake, Supplement acceptance, Diet advancement, Labs, Weight trends, I & O's  REASON FOR ASSESSMENT:  Malnutrition Screening Tool    ASSESSMENT:  66 y/o male PMHx CHF, CKD3, HIV/AIDs, DM2, CVA, HLD, HTN, COPD. Resident of SNF [facility] Presented after found to be increasingly lethargic. Worked up for hypotension, possible UTI and admitted for possible sepsis.   RD operating remotely, however pt is known to RD as [facility]  Patient, in general, has had a chronically poor appetite, however, these past couple weeks he has actually been eating MUCH better. He typically ate 1-25% of meals, but has been eating as much as 75-100% of multiple meals a day recently. Unsure of what had prompted the beneficial change in appetite.   He was receiving a Puree, NTL diet order per SLP recommendations. He was on [employer] TID-he almost always drank 100% of these- will reorder  . He was also receiving a mvi and D3 supplement.   His blood sugars at SNF were typically 120-160 at 6am and 200-250 at 8 pm.   His weight had been increasing over the past few weeks, which was initially concerning for a HF exacerbation, but later felt related to his drastic improvement in PO intake. He was maintaining @110 -113 through May and half way through June, but his last weight on 7/9 was 118 lbs, reflecting 8 lb gain (7.5%) x 1 month  Will implement supplement, downgrade diet and follow while inpatient.   NFPE:unable to assess  at this time  Labs: WBC: 12.5, Albumin: 2.3, GLU: 271, BUN/Creat increased to 76/2.67, Lactic ACid peaked at 5.13 Meds: HAART, Mepron, hydrocortisone, ppi, pressors-neo synephrine. IV abx, IVF   Recent Labs Lab 12/09/16 1058 12/10/16 0437  NA 138 140  K 5.0 4.4  CL 102 113*  CO2 26 21*  BUN 79* 76*  CREATININE 2.14* 2.67*  CALCIUM 9.4 8.3*  GLUCOSE 187* 271*   Diet Order:  Diet regular Room service appropriate? Yes; Fluid consistency: Thin  Skin: Blanchable red sacrum   Last BM:  7/13  Height:  Ht Readings from Last 1 Encounters:  12/09/16 5\' 4"  (1.626 m)   Weight:  Wt Readings from Last 1 Encounters:  12/09/16 117 lb 4.6 oz (53.2 kg)   Wt Readings from Last 10 Encounters:  12/09/16 117 lb 4.6 oz (53.2 kg)  09/04/16 103 lb 12.8 oz (47.1 kg)  07/18/16 112 lb (50.8 kg)  06/26/16 116 lb 12.8 oz (53 kg)  06/04/16 126 lb 9.6 oz (57.4 kg)  04/11/16 135 lb 6.4 oz (61.4 kg)  02/25/16 131 lb 12.8 oz (59.8 kg)  01/21/16 131 lb (59.4 kg)  12/17/15 135 lb 1.6 oz (61.3 kg)  11/28/15 133 lb 6.4 oz (60.5 kg)   Ideal Body Weight:  59.09 kg  BMI:  Body mass index is 20.13 kg/m.  Estimated Nutritional Needs:  Kcal:  1700-1900 32-36 kcal/kg bw) Protein:  75-85g Pro (1.4-1.6 g/kg bw) Fluid:  PER MD   EDUCATION NEEDS:  No education needs  identified at this time  Burtis Junes RD, LDN, [place] [facility] Pager: [phone] 12/10/2016 8:32 AM

## 2016-12-11 LAB — COMPREHENSIVE METABOLIC PANEL
ALK PHOS: 41 U/L (ref 38–126)
ALT: 54 U/L (ref 17–63)
ANION GAP: 7 (ref 5–15)
AST: 36 U/L (ref 15–41)
Albumin: 2.2 g/dL — ABNORMAL LOW (ref 3.5–5.0)
BILIRUBIN TOTAL: 0.7 mg/dL (ref 0.3–1.2)
BUN: 66 mg/dL — ABNORMAL HIGH (ref 6–20)
CALCIUM: 8.3 mg/dL — AB (ref 8.9–10.3)
CO2: 21 mmol/L — ABNORMAL LOW (ref 22–32)
Chloride: 113 mmol/L — ABNORMAL HIGH (ref 101–111)
Creatinine, Ser: 2.11 mg/dL — ABNORMAL HIGH (ref 0.61–1.24)
GFR, EST AFRICAN AMERICAN: 36 mL/min — AB (ref >60)
GFR, EST NON AFRICAN AMERICAN: 31 mL/min — AB (ref >60)
GLUCOSE: 233 mg/dL — AB (ref 65–99)
POTASSIUM: 3.4 mmol/L — AB (ref 3.5–5.1)
Sodium: 141 mmol/L (ref 135–145)
TOTAL PROTEIN: 5.9 g/dL — AB (ref 6.5–8.1)

## 2016-12-11 LAB — CBC WITH DIFFERENTIAL/PLATELET
Basophils Absolute: 0 10*3/uL (ref 0.0–0.1)
Basophils Relative: 0 %
Eosinophils Absolute: 0.2 10*3/uL (ref 0.0–0.7)
Eosinophils Relative: 2 %
HEMATOCRIT: 27.9 % — AB (ref 39.0–52.0)
HEMOGLOBIN: 8.8 g/dL — AB (ref 13.0–17.0)
LYMPHS ABS: 0.2 10*3/uL — AB (ref 0.7–4.0)
Lymphocytes Relative: 3 %
MCH: 29.5 pg (ref 26.0–34.0)
MCHC: 31.5 g/dL (ref 30.0–36.0)
MCV: 93.6 fL (ref 78.0–100.0)
MONO ABS: 0.2 10*3/uL (ref 0.1–1.0)
MONOS PCT: 2 %
NEUTROS ABS: 6.5 10*3/uL (ref 1.7–7.7)
NEUTROS PCT: 92 %
Platelets: 91 10*3/uL — ABNORMAL LOW (ref 150–400)
RBC: 2.98 MIL/uL — ABNORMAL LOW (ref 4.22–5.81)
RDW: 15.2 % (ref 11.5–15.5)
WBC: 7 10*3/uL (ref 4.0–10.5)

## 2016-12-11 LAB — GLUCOSE, CAPILLARY
GLUCOSE-CAPILLARY: 209 mg/dL — AB (ref 65–99)
GLUCOSE-CAPILLARY: 229 mg/dL — AB (ref 65–99)
GLUCOSE-CAPILLARY: 237 mg/dL — AB (ref 65–99)
GLUCOSE-CAPILLARY: 266 mg/dL — AB (ref 65–99)
Glucose-Capillary: 238 mg/dL — ABNORMAL HIGH (ref 65–99)

## 2016-12-11 MED ORDER — HYDROCORTISONE NA SUCCINATE PF 100 MG IJ SOLR
50.0000 mg | Freq: Three times a day (TID) | INTRAMUSCULAR | Status: DC
  Administered 2016-12-11 – 2016-12-12 (×2): 50 mg via INTRAVENOUS
  Filled 2016-12-11 (×2): qty 2

## 2016-12-11 MED ORDER — POTASSIUM CHLORIDE CRYS ER 20 MEQ PO TBCR
40.0000 meq | EXTENDED_RELEASE_TABLET | Freq: Once | ORAL | Status: AC
  Administered 2016-12-11: 40 meq via ORAL
  Filled 2016-12-11: qty 2

## 2016-12-11 NOTE — Progress Notes (Signed)
Subjective: He says he feels okay. He has no specific complaints. His breathing is doing okay. No chest pain. He is off pressors. Echocardiogram still showed ejection fraction 30-35%.  Objective: Vital signs in last 24 hours: Temp:  [97.9 F (36.6 C)-98.7 F (37.1 C)] 97.9 F (36.6 C) (07/15 0400) Pulse Rate:  [47-102] 78 (07/15 0545) Resp:  [13-22] 20 (07/15 0545) BP: (95-137)/(52-103) 130/71 (07/15 0545) SpO2:  [97 %-100 %] 99 % (07/15 0545) Weight:  [58.5 kg (128 lb 15.5 oz)] 58.5 kg (128 lb 15.5 oz) (07/15 0500) Weight change: 11.8 kg (25 lb 15.5 oz) Last BM Date: 12/10/16  Intake/Output from previous day: 07/14 0701 - 07/15 0700 In: 3862.8 [P.O.:1640; I.V.:2122.8; IV Piggyback:100] Out: 2400 [Urine:2400]  PHYSICAL EXAM General appearance: alert, cooperative and no distress Resp: rhonchi bilaterally Cardio: regular rate and rhythm, S1, S2 normal, no murmur, click, rub or gallop GI: soft, non-tender; bowel sounds normal; no masses,  no organomegaly Extremities: extremities normal, atraumatic, no cyanosis or edema Skin warm and dry. Mucous membranes are moist  Lab Results:  Results for orders placed or performed during the hospital encounter of 12/09/16 (from the past 48 hour(s))  Urinalysis, Routine w reflex microscopic     Status: Abnormal   Collection Time: 12/09/16 10:08 AM  Result Value Ref Range   Color, Urine AMBER (A) YELLOW    Comment: BIOCHEMICALS MAY BE AFFECTED BY COLOR   APPearance TURBID (A) CLEAR   Specific Gravity, Urine 1.013 1.005 - 1.030   pH 6.0 5.0 - 8.0   Glucose, UA NEGATIVE NEGATIVE mg/dL   Hgb urine dipstick LARGE (A) NEGATIVE   Bilirubin Urine NEGATIVE NEGATIVE   Ketones, ur NEGATIVE NEGATIVE mg/dL   Protein, ur 100 (A) NEGATIVE mg/dL   Nitrite NEGATIVE NEGATIVE   Leukocytes, UA MODERATE (A) NEGATIVE   RBC / HPF TOO NUMEROUS TO COUNT 0 - 5 RBC/hpf   WBC, UA TOO NUMEROUS TO COUNT 0 - 5 WBC/hpf   Bacteria, UA NONE SEEN NONE SEEN   Squamous  Epithelial / LPF NONE SEEN NONE SEEN   Crystals PRESENT (A) NEGATIVE  Blood Culture (routine x 2)     Status: None (Preliminary result)   Collection Time: 12/09/16 10:37 AM  Result Value Ref Range   Specimen Description BLOOD LEFT HAND    Special Requests      BOTTLES DRAWN AEROBIC AND ANAEROBIC Blood Culture results may not be optimal due to an inadequate volume of blood received in culture bottles   Culture NO GROWTH < 24 HOURS    Report Status PENDING   Blood Culture (routine x 2)     Status: None (Preliminary result)   Collection Time: 12/09/16 10:39 AM  Result Value Ref Range   Specimen Description BLOOD RIGHT HAND    Special Requests      AEROBIC BOTTLE ONLY Blood Culture results may not be optimal due to an inadequate volume of blood received in culture bottles   Culture NO GROWTH < 24 HOURS    Report Status PENDING   I-Stat CG4 Lactic Acid, ED  (not at  [facility])     Status: Abnormal   Collection Time: 12/09/16 10:48 AM  Result Value Ref Range   Lactic Acid, Venous 2.05 (HH) 0.5 - 1.9 mmol/L  Comprehensive metabolic panel     Status: Abnormal   Collection Time: 12/09/16 10:58 AM  Result Value Ref Range   Sodium 138 135 - 145 mmol/L   Potassium 5.0 3.5 - 5.1 mmol/L  Chloride 102 101 - 111 mmol/L   CO2 26 22 - 32 mmol/L   Glucose, Bld 187 (H) 65 - 99 mg/dL   BUN 79 (H) 6 - 20 mg/dL   Creatinine, Ser 2.14 (H) 0.61 - 1.24 mg/dL   Calcium 9.4 8.9 - 10.3 mg/dL   Total Protein 7.3 6.5 - 8.1 g/dL   Albumin 2.8 (L) 3.5 - 5.0 g/dL   AST 46 (H) 15 - 41 U/L   ALT 67 (H) 17 - 63 U/L   Alkaline Phosphatase 75 38 - 126 U/L   Total Bilirubin 0.6 0.3 - 1.2 mg/dL   GFR calc non Af Amer 31 (L) >60 mL/min   GFR calc Af Amer 36 (L) >60 mL/min    Comment: (NOTE) The eGFR has been calculated using the CKD EPI equation. This calculation has not been validated in all clinical situations. eGFR's persistently <60 mL/min signify possible Chronic Kidney Disease.    Anion gap 10 5 - 15  CBC  WITH DIFFERENTIAL     Status: Abnormal   Collection Time: 12/09/16 10:58 AM  Result Value Ref Range   WBC 3.1 (L) 4.0 - 10.5 K/uL   RBC 3.63 (L) 4.22 - 5.81 MIL/uL   Hemoglobin 10.9 (L) 13.0 - 17.0 g/dL   HCT 33.9 (L) 39.0 - 52.0 %   MCV 93.4 78.0 - 100.0 fL   MCH 30.0 26.0 - 34.0 pg   MCHC 32.2 30.0 - 36.0 g/dL   RDW 15.0 11.5 - 15.5 %   Platelets 101 (L) 150 - 400 K/uL    Comment: CONSISTENT WITH PREVIOUS RESULT SPECIMEN CHECKED FOR CLOTS    Neutrophils Relative % 70 %   Neutro Abs 2.2 1.7 - 7.7 K/uL   Lymphocytes Relative 15 %   Lymphs Abs 0.5 (L) 0.7 - 4.0 K/uL   Monocytes Relative 12 %   Monocytes Absolute 0.4 0.1 - 1.0 K/uL   Eosinophils Relative 2 %   Eosinophils Absolute 0.1 0.0 - 0.7 K/uL   Basophils Relative 0 %   Basophils Absolute 0.0 0.0 - 0.1 K/uL  Lactic acid, plasma     Status: Abnormal   Collection Time: 12/09/16 10:58 AM  Result Value Ref Range   Lactic Acid, Venous 2.1 (HH) 0.5 - 1.9 mmol/L    Comment: CRITICAL RESULT CALLED TO, READ BACK BY AND VERIFIED WITH: KENDRICK,J @ 1140 ON 7.13.18 BY BAYSE,L   Lactic acid, plasma     Status: None   Collection Time: 12/09/16 12:55 PM  Result Value Ref Range   Lactic Acid, Venous 1.8 0.5 - 1.9 mmol/L  Procalcitonin     Status: None   Collection Time: 12/09/16 12:55 PM  Result Value Ref Range   Procalcitonin 0.20 ng/mL    Comment:        Interpretation: PCT (Procalcitonin) <= 0.5 ng/mL: Systemic infection (sepsis) is not likely. Local bacterial infection is possible. (NOTE)         ICU PCT Algorithm               Non ICU PCT Algorithm    ----------------------------     ------------------------------         PCT < 0.25 ng/mL                 PCT < 0.1 ng/mL     Stopping of antibiotics            Stopping of antibiotics       strongly encouraged.  strongly encouraged.    ----------------------------     ------------------------------       PCT level decrease by               PCT < 0.25 ng/mL        >= 80% from peak PCT       OR PCT 0.25 - 0.5 ng/mL          Stopping of antibiotics                                             encouraged.     Stopping of antibiotics           encouraged.    ----------------------------     ------------------------------       PCT level decrease by              PCT >= 0.25 ng/mL       < 80% from peak PCT        AND PCT >= 0.5 ng/mL            Continuin g antibiotics                                              encouraged.       Continuing antibiotics            encouraged.    ----------------------------     ------------------------------     PCT level increase compared          PCT > 0.5 ng/mL         with peak PCT AND          PCT >= 0.5 ng/mL             Escalation of antibiotics                                          strongly encouraged.      Escalation of antibiotics        strongly encouraged.   Protime-INR     Status: Abnormal   Collection Time: 12/09/16 12:55 PM  Result Value Ref Range   Prothrombin Time 15.6 (H) 11.4 - 15.2 seconds   INR 1.23   APTT     Status: None   Collection Time: 12/09/16 12:55 PM  Result Value Ref Range   aPTT 31 24 - 36 seconds  Cortisol     Status: None   Collection Time: 12/09/16 12:55 PM  Result Value Ref Range   Cortisol, Plasma 22.6 ug/dL    Comment: (NOTE) AM    6.7 - 22.6 ug/dL PM   <10.0       ug/dL Performed at [facility] [address] [place], [place] [address]   I-Stat CG4 Lactic Acid, ED  (not at  [facility])     Status: Abnormal   Collection Time: 12/09/16  3:53 PM  Result Value Ref Range   Lactic Acid, Venous 5.13 (HH) 0.5 - 1.9 mmol/L  MRSA PCR Screening     Status: None   Collection Time: 12/09/16  4:50 PM  Result Value Ref Range  MRSA by PCR NEGATIVE NEGATIVE    Comment:        The GeneXpert MRSA Assay (FDA approved for NASAL specimens only), is one component of a comprehensive MRSA colonization surveillance program. It is not intended to diagnose MRSA infection nor to  guide or monitor treatment for MRSA infections.   Lactic acid, plasma     Status: Abnormal   Collection Time: 12/09/16  7:14 PM  Result Value Ref Range   Lactic Acid, Venous 2.7 (HH) 0.5 - 1.9 mmol/L    Comment: CRITICAL RESULT CALLED TO, READ BACK BY AND VERIFIED WITH: ROBERTS,T ON 12/09/16 AT 2105 BY LOY,C   Lactic acid, plasma     Status: None   Collection Time: 12/09/16 11:00 PM  Result Value Ref Range   Lactic Acid, Venous 1.4 0.5 - 1.9 mmol/L  Comprehensive metabolic panel     Status: Abnormal   Collection Time: 12/10/16  4:37 AM  Result Value Ref Range   Sodium 140 135 - 145 mmol/L   Potassium 4.4 3.5 - 5.1 mmol/L   Chloride 113 (H) 101 - 111 mmol/L   CO2 21 (L) 22 - 32 mmol/L   Glucose, Bld 271 (H) 65 - 99 mg/dL   BUN 76 (H) 6 - 20 mg/dL   Creatinine, Ser 2.67 (H) 0.61 - 1.24 mg/dL   Calcium 8.3 (L) 8.9 - 10.3 mg/dL   Total Protein 6.4 (L) 6.5 - 8.1 g/dL   Albumin 2.3 (L) 3.5 - 5.0 g/dL   AST 56 (H) 15 - 41 U/L   ALT 55 17 - 63 U/L   Alkaline Phosphatase 55 38 - 126 U/L   Total Bilirubin 0.6 0.3 - 1.2 mg/dL   GFR calc non Af Amer 24 (L) >60 mL/min   GFR calc Af Amer 27 (L) >60 mL/min    Comment: (NOTE) The eGFR has been calculated using the CKD EPI equation. This calculation has not been validated in all clinical situations. eGFR's persistently <60 mL/min signify possible Chronic Kidney Disease.    Anion gap 6 5 - 15  CBC     Status: Abnormal   Collection Time: 12/10/16  4:37 AM  Result Value Ref Range   WBC 12.5 (H) 4.0 - 10.5 K/uL   RBC 3.49 (L) 4.22 - 5.81 MIL/uL   Hemoglobin 10.4 (L) 13.0 - 17.0 g/dL   HCT 33.0 (L) 39.0 - 52.0 %   MCV 94.6 78.0 - 100.0 fL   MCH 29.8 26.0 - 34.0 pg   MCHC 31.5 30.0 - 36.0 g/dL   RDW 15.4 11.5 - 15.5 %   Platelets 127 (L) 150 - 400 K/uL  C difficile quick scan w PCR reflex     Status: None   Collection Time: 12/10/16  4:08 PM  Result Value Ref Range   C Diff antigen NEGATIVE NEGATIVE   C Diff toxin NEGATIVE NEGATIVE    C Diff interpretation No C. difficile detected.   Glucose, capillary     Status: Abnormal   Collection Time: 12/10/16  8:09 PM  Result Value Ref Range   Glucose-Capillary 322 (H) 65 - 99 mg/dL  Glucose, capillary     Status: Abnormal   Collection Time: 12/11/16 12:22 AM  Result Value Ref Range   Glucose-Capillary 209 (H) 65 - 99 mg/dL  CBC with Differential/Platelet     Status: Abnormal   Collection Time: 12/11/16  4:21 AM  Result Value Ref Range   WBC 7.0 4.0 - 10.5 K/uL  Comment: WHITE COUNT CONFIRMED ON SMEAR TOXIC GRANULATION    RBC 2.98 (L) 4.22 - 5.81 MIL/uL   Hemoglobin 8.8 (L) 13.0 - 17.0 g/dL   HCT 27.9 (L) 39.0 - 52.0 %   MCV 93.6 78.0 - 100.0 fL   MCH 29.5 26.0 - 34.0 pg   MCHC 31.5 30.0 - 36.0 g/dL   RDW 15.2 11.5 - 15.5 %   Platelets 91 (L) 150 - 400 K/uL    Comment: SPECIMEN CHECKED FOR CLOTS PLATELET COUNT CONFIRMED BY SMEAR    Neutrophils Relative % 92 %   Neutro Abs 6.5 1.7 - 7.7 K/uL   Lymphocytes Relative 3 %   Lymphs Abs 0.2 (L) 0.7 - 4.0 K/uL   Monocytes Relative 2 %   Monocytes Absolute 0.2 0.1 - 1.0 K/uL   Eosinophils Relative 2 %   Eosinophils Absolute 0.2 0.0 - 0.7 K/uL   Basophils Relative 0 %   Basophils Absolute 0.0 0.0 - 0.1 K/uL  Comprehensive metabolic panel     Status: Abnormal   Collection Time: 12/11/16  4:21 AM  Result Value Ref Range   Sodium 141 135 - 145 mmol/L   Potassium 3.4 (L) 3.5 - 5.1 mmol/L    Comment: DELTA CHECK NOTED   Chloride 113 (H) 101 - 111 mmol/L   CO2 21 (L) 22 - 32 mmol/L   Glucose, Bld 233 (H) 65 - 99 mg/dL   BUN 66 (H) 6 - 20 mg/dL   Creatinine, Ser 2.11 (H) 0.61 - 1.24 mg/dL   Calcium 8.3 (L) 8.9 - 10.3 mg/dL   Total Protein 5.9 (L) 6.5 - 8.1 g/dL   Albumin 2.2 (L) 3.5 - 5.0 g/dL   AST 36 15 - 41 U/L   ALT 54 17 - 63 U/L   Alkaline Phosphatase 41 38 - 126 U/L   Total Bilirubin 0.7 0.3 - 1.2 mg/dL   GFR calc non Af Amer 31 (L) >60 mL/min   GFR calc Af Amer 36 (L) >60 mL/min    Comment: (NOTE) The  eGFR has been calculated using the CKD EPI equation. This calculation has not been validated in all clinical situations. eGFR's persistently <60 mL/min signify possible Chronic Kidney Disease.    Anion gap 7 5 - 15    ABGS No results for input(s): PHART, PO2ART, TCO2, HCO3 in the last 72 hours.  Invalid input(s): PCO2 CULTURES Recent Results (from the past 240 hour(s))  Blood Culture (routine x 2)     Status: None (Preliminary result)   Collection Time: 12/09/16 10:37 AM  Result Value Ref Range Status   Specimen Description BLOOD LEFT HAND  Final   Special Requests   Final    BOTTLES DRAWN AEROBIC AND ANAEROBIC Blood Culture results may not be optimal due to an inadequate volume of blood received in culture bottles   Culture NO GROWTH < 24 HOURS  Final   Report Status PENDING  Incomplete  Blood Culture (routine x 2)     Status: None (Preliminary result)   Collection Time: 12/09/16 10:39 AM  Result Value Ref Range Status   Specimen Description BLOOD RIGHT HAND  Final   Special Requests   Final    AEROBIC BOTTLE ONLY Blood Culture results may not be optimal due to an inadequate volume of blood received in culture bottles   Culture NO GROWTH < 24 HOURS  Final   Report Status PENDING  Incomplete  MRSA PCR Screening     Status: None  Collection Time: 12/09/16  4:50 PM  Result Value Ref Range Status   MRSA by PCR NEGATIVE NEGATIVE Final    Comment:        The GeneXpert MRSA Assay (FDA approved for NASAL specimens only), is one component of a comprehensive MRSA colonization surveillance program. It is not intended to diagnose MRSA infection nor to guide or monitor treatment for MRSA infections.   C difficile quick scan w PCR reflex     Status: None   Collection Time: 12/10/16  4:08 PM  Result Value Ref Range Status   C Diff antigen NEGATIVE NEGATIVE Final   C Diff toxin NEGATIVE NEGATIVE Final   C Diff interpretation No C. difficile detected.  Final    Studies/Results: Dg Chest 2 View  Result Date: 12/09/2016 CLINICAL DATA:  Sepsis EXAM: CHEST  2 VIEW COMPARISON:  09/05/2016 FINDINGS: Left AICD remains in place, unchanged. Prior median sternotomy. Mild cardiomegaly. Lungs are clear. No effusions or acute bony abnormality. IMPRESSION: Cardiomegaly.  No active disease. Electronically Signed   By: Rolm Baptise M.D.   On: 12/09/2016 11:30    Medications:  Prior to Admission:  Prescriptions Prior to Admission  Medication Sig Dispense Refill Last Dose  . acetaminophen (TYLENOL) 325 MG tablet Take 2 tablets (650 mg total) by mouth every 4 (four) hours as needed for mild pain (or temp > 37.5 C (99.5 F)).   Taking  . aspirin 325 MG tablet Take 1 tablet (325 mg total) by mouth daily. (Patient taking differently: Take 325 mg by mouth daily. Delayed release tablet)   12/09/2016 at Unknown time  . atorvastatin (LIPITOR) 80 MG tablet Take 80 mg by mouth daily.    12/08/2016 at Unknown time  . atovaquone (MEPRON) 750 MG/5ML suspension Take 10 mLs (1,500 mg total) by mouth daily with breakfast. 300 mL 5 12/09/2016 at Unknown time  . carvedilol (COREG) 3.125 MG tablet Take 1 tablet (3.125 mg total) by mouth 2 (two) times daily with a meal. 60 tablet 12 12/08/2016 at Unknown time  . Cholecalciferol (VITAMIN D3) 2000 UNITS TABS Take 2,000 mg by mouth daily.   12/09/2016 at Unknown time  . digoxin (LANOXIN) 0.125 MG tablet TAKE 1/2 TABLET BY MOUTH DAILY 15 tablet 3 12/09/2016 at Unknown time  . dolutegravir (TIVICAY) 50 MG tablet Take 1 tablet (50 mg total) by mouth daily. 30 tablet  12/09/2016 at Unknown time  . emtricitabine-tenofovir AF (DESCOVY) 200-25 MG tablet Take 1 tablet by mouth daily. 30 tablet  12/09/2016 at Unknown time  . feeding supplement, ENSURE ENLIVE, (ENSURE ENLIVE) LIQD Take 237 mLs by mouth 2 (two) times daily between meals. (Patient taking differently: Take 237 mLs by mouth 3 (three) times daily between meals. ) 237 mL 12 12/08/2016 at Unknown  time  . ferrous sulfate 325 (65 FE) MG tablet Take 325 mg by mouth daily with breakfast.   12/09/2016 at Unknown time  . isosorbide dinitrate (ISORDIL) 10 MG tablet Take 10 mg by mouth 3 (three) times daily.    12/09/2016 at Unknown time  . mirabegron ER (MYRBETRIQ) 25 MG TB24 tablet Take 25 mg by mouth daily.   12/09/2016 at Unknown time  . Multiple Vitamin (MULTIVITAMIN WITH MINERALS) TABS tablet Take 1 tablet by mouth daily.   12/09/2016 at Unknown time  . dronabinol (MARINOL) 2.5 MG capsule Take 1 capsule (2.5 mg total) by mouth 2 (two) times daily before lunch and supper. (Patient not taking: Reported on 12/09/2016)   Not Taking at  Unknown time  . pantoprazole (PROTONIX) 40 MG tablet TAKE ONE TABLET   BY MOUTH   DAILY 30 tablet 11 Taking   Scheduled: . aspirin EC  325 mg Oral Daily  . atorvastatin  80 mg Oral q1800  . atovaquone  1,500 mg Oral Q breakfast  . dolutegravir  50 mg Oral Daily  . emtricitabine-tenofovir AF  1 tablet Oral Daily  . enoxaparin (LOVENOX) injection  30 mg Subcutaneous Q24H  . feeding supplement (ENSURE ENLIVE)  237 mL Oral TID BM  . hydrocortisone sod succinate (SOLU-CORTEF) inj  100 mg Intravenous Q8H  . insulin aspart  0-5 Units Subcutaneous QHS  . insulin aspart  0-9 Units Subcutaneous TID WC  . mirabegron ER  25 mg Oral Daily  . pantoprazole  40 mg Oral Daily   Continuous: . sodium chloride 75 mL/hr at 12/10/16 1915  . phenylephrine (NEO-SYNEPHRINE) Adult infusion Stopped (12/10/16 1455)  . piperacillin-tazobactam (ZOSYN)  IV 3.375 g (12/11/16 0410)  . sodium chloride     And  . sodium chloride    . vancomycin Stopped (12/11/16 0604)   FFK:[id number] **OR** acetaminophen, food thickener, ondansetron **OR** ondansetron (ZOFRAN) IV  Assesment: He was admitted with sepsis. This is presumably from urinary tract infection. He has responded well to antibiotics and fluid but we can't give him too much fluid because he has severe heart failure. He has multiple  other medical problems including severe malnutrition, HIV disease with full-blown AIDS, chronic kidney disease, coronary artery occlusive disease, previous stroke, pancytopenia multifactorial. He initially required pressors but he's off now. Echocardiogram shows no significant change. Overt signs of heart failure. No chest pain. No neurological changes. Active Problems:   CKD (chronic kidney disease)   Anemia of chronic kidney failure   Pancytopenia (HCC)   Severe malnutrition (HCC)   AKI (acute kidney injury) ([place])   HIV disease ([place])   Sepsis ([place])   Urinary tract infection with hematuria   Chronic combined systolic and diastolic CHF (congestive heart failure) (HCC)   Severe sepsis with septic shock ([place])    Plan: Transfer from stepdown continue IV antibiotics    LOS: 2 days   Keith Young L 12/11/2016, 7:14 AM

## 2016-12-12 LAB — BASIC METABOLIC PANEL
Anion gap: 5 (ref 5–15)
BUN: 47 mg/dL — AB (ref 6–20)
CALCIUM: 8.8 mg/dL — AB (ref 8.9–10.3)
CO2: 24 mmol/L (ref 22–32)
CREATININE: 1.56 mg/dL — AB (ref 0.61–1.24)
Chloride: 117 mmol/L — ABNORMAL HIGH (ref 101–111)
GFR, EST AFRICAN AMERICAN: 52 mL/min — AB (ref >60)
GFR, EST NON AFRICAN AMERICAN: 45 mL/min — AB (ref >60)
Glucose, Bld: 254 mg/dL — ABNORMAL HIGH (ref 65–99)
Potassium: 3 mmol/L — ABNORMAL LOW (ref 3.5–5.1)
SODIUM: 146 mmol/L — AB (ref 135–145)

## 2016-12-12 LAB — GLUCOSE, CAPILLARY
GLUCOSE-CAPILLARY: 236 mg/dL — AB (ref 65–99)
GLUCOSE-CAPILLARY: 202 mg/dL — AB (ref 65–99)
Glucose-Capillary: 126 mg/dL — ABNORMAL HIGH (ref 65–99)
Glucose-Capillary: 120 mg/dL — ABNORMAL HIGH (ref 65–99)

## 2016-12-12 LAB — URINE CULTURE: Culture: >=100000 — AB

## 2016-12-12 MED ORDER — POTASSIUM CHLORIDE CRYS ER 20 MEQ PO TBCR
40.0000 meq | EXTENDED_RELEASE_TABLET | Freq: Two times a day (BID) | ORAL | Status: DC
  Administered 2016-12-12 – 2016-12-13 (×3): 40 meq via ORAL
  Filled 2016-12-12 (×3): qty 2

## 2016-12-12 MED ORDER — ENOXAPARIN SODIUM 40 MG/0.4ML ~~LOC~~ SOLN
40.0000 mg | SUBCUTANEOUS | Status: DC
  Administered 2016-12-12: 40 mg via SUBCUTANEOUS
  Filled 2016-12-12: qty 0.4

## 2016-12-12 NOTE — [place] FL2 (Signed)
[place] MEDICAID FL2 LEVEL OF CARE SCREENING TOOL     IDENTIFICATION  Patient Name: Keith Young Birthdate: 1950/09/03 Sex: male Admission Date (Current Location): 12/09/2016  [facility] and [place] Number:  Herbalist and Address:  [facility] [address] [place]      Provider Number: [id number]  Attending Physician Name and Address:  Sinda Du, MD  Relative Name and Phone Number:       Current Level of Care: Hospital Recommended Level of Care: [place] Prior Approval Number:    Date Approved/Denied:   PASRR Number:    Discharge Plan: SNF    Current Diagnoses: Patient Active Problem List   Diagnosis Date Noted  . Sepsis ([place]) 12/09/2016  . Urinary tract infection with hematuria 12/09/2016  . Chronic combined systolic and diastolic CHF (congestive heart failure) ([place]) 12/09/2016  . Severe sepsis with septic shock ([place]) 12/09/2016  . At risk for opportunistic infections 11/29/2016  . COPD (chronic obstructive pulmonary disease) ([place]) 09/12/2016  . Bacteremia 09/12/2016  . Encounter for hospice care discussion   . Protein-calorie malnutrition, severe 09/06/2016  . HIV disease ([place]) 09/06/2016  . Palliative care encounter   . Goals of care, counseling/discussion   . DNR (do not resuscitate) discussion   . AKI (acute kidney injury) ([place]) 09/04/2016  . Malnutrition of moderate degree 06/28/2016  . Altered mental status 06/26/2016  . CVA (cerebral vascular accident) ([place]) 06/26/2016  . Severe malnutrition ([place]) 06/26/2016  . HCAP (healthcare-associated pneumonia) 06/04/2016  . Mucosal abnormality of stomach   . Gastritis and gastroduodenitis 09/30/2015  . Heme positive stool 09/30/2015  . Pancytopenia ([place]) 09/30/2015  . Chest pain 04/28/2015  . Elevated troponin 04/28/2015  . ICD (implantable cardioverter-defibrillator) in place 01/22/2015  . Anemia 09/30/2014  . Gastric polyp   . Reflux esophagitis    . Elevated LFTs 03/26/2014  . Anemia in chronic kidney disease 03/26/2014  . Carotid bruit 12/18/2013  . Acute on chronic systolic heart failure ([place]) 10/24/2013  . CHF (congestive heart failure), NYHA class III ([place]) 10/23/2013  . Cough 10/23/2013  . CKD (chronic kidney disease) 10/23/2013  . Diabetes mellitus type 2 in nonobese ([place]) 10/23/2013  . Essential hypertension, benign 10/23/2013  . Hyperkalemia 10/23/2013  . CAD (coronary artery disease) 10/23/2013  . History of stroke 10/23/2013  . Anemia of chronic kidney failure 10/23/2013  . Other and unspecified hyperlipidemia 10/23/2013    Orientation RESPIRATION BLADDER Height & Weight     Self  Normal Incontinent, Indwelling catheter (chronic) Weight: 128 lb 15.5 oz (58.5 kg) Height:  5\' 4"  (162.6 cm)  BEHAVIORAL SYMPTOMS/MOOD NEUROLOGICAL BOWEL NUTRITION STATUS      Incontinent Diet (See DC summary)  AMBULATORY STATUS COMMUNICATION OF NEEDS Skin   Extensive Assist Verbally Normal (Has  PICC Line)                       Personal Care Assistance Level of Assistance  Bathing, Feeding, Dressing Bathing Assistance: Maximum assistance Feeding assistance: Limited assistance Dressing Assistance: Maximum assistance     Functional Limitations Info  Sight, Hearing, Speech Sight Info: Adequate Hearing Info: Adequate Speech Info: Adequate    SPECIAL CARE FACTORS FREQUENCY  PT (By licensed PT), OT (By licensed OT)     PT Frequency: 5x OT Frequency: 5x            Contractures Contractures Info: Not present    Additional Factors Info  Code  Status, Allergies, Isolation Precautions Code Status Info: DNR Allergies Info: NKA     Isolation Precautions Info: Enteric Pre     Current Medications (12/12/2016):  This is the current hospital active medication list Current Facility-Administered Medications  Medication Dose Route Frequency Provider Last Rate Last Dose  . 0.9 %  sodium chloride infusion   Intravenous  Continuous Kathie Dike, MD 75 mL/hr at 12/12/16 0110    . acetaminophen (TYLENOL) tablet 650 mg  650 mg Oral Q6H PRN Kathie Dike, MD       Or  . acetaminophen (TYLENOL) suppository 650 mg  650 mg Rectal Q6H PRN Kathie Dike, MD      . aspirin EC tablet 325 mg  325 mg Oral Daily Sinda Du, MD   325 mg at 12/12/16 1036  . atorvastatin (LIPITOR) tablet 80 mg  80 mg Oral q1800 Kathie Dike, MD   80 mg at 12/11/16 1726  . atovaquone (MEPRON) 750 MG/5ML suspension 1,500 mg  1,500 mg Oral Q breakfast Kathie Dike, MD   1,500 mg at 12/12/16 0826  . dolutegravir (TIVICAY) tablet 50 mg  50 mg Oral Daily Kathie Dike, MD   50 mg at 12/12/16 1036  . emtricitabine-tenofovir AF (DESCOVY) 200-25 MG per tablet 1 tablet  1 tablet Oral Daily Kathie Dike, MD   1 tablet at 12/12/16 1036  . enoxaparin (LOVENOX) injection 40 mg  40 mg Subcutaneous Q24H Sinda Du, MD      . feeding supplement (ENSURE ENLIVE) (ENSURE ENLIVE) liquid 237 mL  237 mL Oral TID BM Sinda Du, MD   237 mL at 12/11/16 1959  . food thickener (THICK IT) powder   Oral PRN Sinda Du, MD      . insulin aspart (novoLOG) injection 0-5 Units  0-5 Units Subcutaneous QHS Jani Gravel, MD   2 Units at 12/11/16 2201  . insulin aspart (novoLOG) injection 0-9 Units  0-9 Units Subcutaneous TID WC Jani Gravel, MD   3 Units at 12/12/16 0825  . mirabegron ER (MYRBETRIQ) tablet 25 mg  25 mg Oral Daily Kathie Dike, MD   25 mg at 12/12/16 1036  . ondansetron (ZOFRAN) tablet 4 mg  4 mg Oral Q6H PRN Kathie Dike, MD       Or  . ondansetron (ZOFRAN) injection 4 mg  4 mg Intravenous Q6H PRN Kathie Dike, MD      . pantoprazole (PROTONIX) EC tablet 40 mg  40 mg Oral Daily Kathie Dike, MD   40 mg at 12/12/16 1039  . piperacillin-tazobactam (ZOSYN) IVPB 3.375 g  3.375 g Intravenous Albesa Seen, MD   Stopped at 12/12/16 [phone]  . potassium chloride SA (K-DUR,KLOR-CON) CR tablet 40 mEq  40 mEq Oral BID Sinda Du, MD   40 mEq at 12/12/16 1036  . vancomycin (VANCOCIN) IVPB 1000 mg/200 mL premix  1,000 mg Intravenous Q24H Sinda Du, MD 200 mL/hr at 12/12/16 0544 1,000 mg at 12/12/16 0544     Discharge Medications: Please see discharge summary for a list of discharge medications.  Relevant Imaging Results:  Relevant Lab Results:   Additional Information SSN: [id number].   Lilly Cove, LCSW

## 2016-12-12 NOTE — Progress Notes (Signed)
Subjective: He says he feels much better. He is much more alert and awake. He sitting up and eating. He is growing Proteus in his urine but I don't have sensitivities yet. He is still on IV antibiotics he is also still on IV steroids  Objective: Vital signs in last 24 hours: Temp:  [98.4 F (36.9 C)-98.6 F (37 C)] 98.5 F (36.9 C) (07/16 0531) Pulse Rate:  [54-87] 80 (07/16 0531) Resp:  [17-20] 18 (07/16 0531) BP: (119-162)/(69-95) 161/95 (07/16 0531) SpO2:  [98 %-100 %] 100 % (07/16 0531) Weight change:  Last BM Date: 12/10/16  Intake/Output from previous day: 07/15 0701 - 07/16 0700 In: 7766.3 [P.O.:4320; I.V.:3046.3; IV Piggyback:400] Out: 2350 [Urine:2350]  PHYSICAL EXAM General appearance: alert, cooperative and no distress Resp: clear to auscultation bilaterally Cardio: Regular with systolic heart murmur GI: soft, non-tender; bowel sounds normal; no masses,  no organomegaly Extremities: extremities normal, atraumatic, no cyanosis or edema Skin warm and dry  Lab Results:  Results for orders placed or performed during the hospital encounter of 12/09/16 (from the past 48 hour(s))  C difficile quick scan w PCR reflex     Status: None   Collection Time: 12/10/16  4:08 PM  Result Value Ref Range   C Diff antigen NEGATIVE NEGATIVE   C Diff toxin NEGATIVE NEGATIVE   C Diff interpretation No C. difficile detected.   Glucose, capillary     Status: Abnormal   Collection Time: 12/10/16  8:09 PM  Result Value Ref Range   Glucose-Capillary 322 (H) 65 - 99 mg/dL  Glucose, capillary     Status: Abnormal   Collection Time: 12/11/16 12:22 AM  Result Value Ref Range   Glucose-Capillary 209 (H) 65 - 99 mg/dL  CBC with Differential/Platelet     Status: Abnormal   Collection Time: 12/11/16  4:21 AM  Result Value Ref Range   WBC 7.0 4.0 - 10.5 K/uL    Comment: WHITE COUNT CONFIRMED ON SMEAR TOXIC GRANULATION    RBC 2.98 (L) 4.22 - 5.81 MIL/uL   Hemoglobin 8.8 (L) 13.0 - 17.0  g/dL   HCT 27.9 (L) 39.0 - 52.0 %   MCV 93.6 78.0 - 100.0 fL   MCH 29.5 26.0 - 34.0 pg   MCHC 31.5 30.0 - 36.0 g/dL   RDW 15.2 11.5 - 15.5 %   Platelets 91 (L) 150 - 400 K/uL    Comment: SPECIMEN CHECKED FOR CLOTS PLATELET COUNT CONFIRMED BY SMEAR    Neutrophils Relative % 92 %   Neutro Abs 6.5 1.7 - 7.7 K/uL   Lymphocytes Relative 3 %   Lymphs Abs 0.2 (L) 0.7 - 4.0 K/uL   Monocytes Relative 2 %   Monocytes Absolute 0.2 0.1 - 1.0 K/uL   Eosinophils Relative 2 %   Eosinophils Absolute 0.2 0.0 - 0.7 K/uL   Basophils Relative 0 %   Basophils Absolute 0.0 0.0 - 0.1 K/uL  Comprehensive metabolic panel     Status: Abnormal   Collection Time: 12/11/16  4:21 AM  Result Value Ref Range   Sodium 141 135 - 145 mmol/L   Potassium 3.4 (L) 3.5 - 5.1 mmol/L    Comment: DELTA CHECK NOTED   Chloride 113 (H) 101 - 111 mmol/L   CO2 21 (L) 22 - 32 mmol/L   Glucose, Bld 233 (H) 65 - 99 mg/dL   BUN 66 (H) 6 - 20 mg/dL   Creatinine, Ser 2.11 (H) 0.61 - 1.24 mg/dL   Calcium 8.3 (L)  8.9 - 10.3 mg/dL   Total Protein 5.9 (L) 6.5 - 8.1 g/dL   Albumin 2.2 (L) 3.5 - 5.0 g/dL   AST 36 15 - 41 U/L   ALT 54 17 - 63 U/L   Alkaline Phosphatase 41 38 - 126 U/L   Total Bilirubin 0.7 0.3 - 1.2 mg/dL   GFR calc non Af Amer 31 (L) >60 mL/min   GFR calc Af Amer 36 (L) >60 mL/min    Comment: (NOTE) The eGFR has been calculated using the CKD EPI equation. This calculation has not been validated in all clinical situations. eGFR's persistently <60 mL/min signify possible Chronic Kidney Disease.    Anion gap 7 5 - 15  Glucose, capillary     Status: Abnormal   Collection Time: 12/11/16  8:08 AM  Result Value Ref Range   Glucose-Capillary 237 (H) 65 - 99 mg/dL  Glucose, capillary     Status: Abnormal   Collection Time: 12/11/16 11:40 AM  Result Value Ref Range   Glucose-Capillary 229 (H) 65 - 99 mg/dL  Glucose, capillary     Status: Abnormal   Collection Time: 12/11/16  5:04 PM  Result Value Ref Range    Glucose-Capillary 266 (H) 65 - 99 mg/dL   Comment 1 Notify RN    Comment 2 Document in Chart   Glucose, capillary     Status: Abnormal   Collection Time: 12/11/16  9:42 PM  Result Value Ref Range   Glucose-Capillary 238 (H) 65 - 99 mg/dL   Comment 1 Notify RN    Comment 2 Document in Chart   Basic metabolic panel     Status: Abnormal   Collection Time: 12/12/16  6:51 AM  Result Value Ref Range   Sodium 146 (H) 135 - 145 mmol/L   Potassium 3.0 (L) 3.5 - 5.1 mmol/L   Chloride 117 (H) 101 - 111 mmol/L   CO2 24 22 - 32 mmol/L   Glucose, Bld 254 (H) 65 - 99 mg/dL   BUN 47 (H) 6 - 20 mg/dL   Creatinine, Ser 1.56 (H) 0.61 - 1.24 mg/dL   Calcium 8.8 (L) 8.9 - 10.3 mg/dL   GFR calc non Af Amer 45 (L) >60 mL/min   GFR calc Af Amer 52 (L) >60 mL/min    Comment: (NOTE) The eGFR has been calculated using the CKD EPI equation. This calculation has not been validated in all clinical situations. eGFR's persistently <60 mL/min signify possible Chronic Kidney Disease.    Anion gap 5 5 - 15  Glucose, capillary     Status: Abnormal   Collection Time: 12/12/16  7:43 AM  Result Value Ref Range   Glucose-Capillary 236 (H) 65 - 99 mg/dL    ABGS No results for input(s): PHART, PO2ART, TCO2, HCO3 in the last 72 hours.  Invalid input(s): PCO2 CULTURES Recent Results (from the past 240 hour(s))  Blood Culture (routine x 2)     Status: None (Preliminary result)   Collection Time: 12/09/16 10:37 AM  Result Value Ref Range Status   Specimen Description BLOOD LEFT HAND  Final   Special Requests   Final    BOTTLES DRAWN AEROBIC AND ANAEROBIC Blood Culture results may not be optimal due to an inadequate volume of blood received in culture bottles   Culture NO GROWTH 2 DAYS  Final   Report Status PENDING  Incomplete  Blood Culture (routine x 2)     Status: None (Preliminary result)   Collection Time: 12/09/16 10:39  AM  Result Value Ref Range Status   Specimen Description BLOOD RIGHT HAND  Final    Special Requests   Final    AEROBIC BOTTLE ONLY Blood Culture results may not be optimal due to an inadequate volume of blood received in culture bottles   Culture NO GROWTH 2 DAYS  Final   Report Status PENDING  Incomplete  MRSA PCR Screening     Status: None   Collection Time: 12/09/16  4:50 PM  Result Value Ref Range Status   MRSA by PCR NEGATIVE NEGATIVE Final    Comment:        The GeneXpert MRSA Assay (FDA approved for NASAL specimens only), is one component of a comprehensive MRSA colonization surveillance program. It is not intended to diagnose MRSA infection nor to guide or monitor treatment for MRSA infections.   Urine culture     Status: Abnormal (Preliminary result)   Collection Time: 12/09/16  5:12 PM  Result Value Ref Range Status   Specimen Description URINE, CLEAN CATCH  Final   Special Requests NONE  Final   Culture (A)  Final    >=100,000 COLONIES/mL PROTEUS MIRABILIS SUSCEPTIBILITIES TO FOLLOW Performed at [facility], [address], [place], [place] [address]    Report Status PENDING  Incomplete  C difficile quick scan w PCR reflex     Status: None   Collection Time: 12/10/16  4:08 PM  Result Value Ref Range Status   C Diff antigen NEGATIVE NEGATIVE Final   C Diff toxin NEGATIVE NEGATIVE Final   C Diff interpretation No C. difficile detected.  Final   Studies/Results: No results found.  Medications:  Prior to Admission:  Prescriptions Prior to Admission  Medication Sig Dispense Refill Last Dose  . acetaminophen (TYLENOL) 325 MG tablet Take 2 tablets (650 mg total) by mouth every 4 (four) hours as needed for mild pain (or temp > 37.5 C (99.5 F)).   Taking  . aspirin 325 MG tablet Take 1 tablet (325 mg total) by mouth daily. (Patient taking differently: Take 325 mg by mouth daily. Delayed release tablet)   12/09/2016 at Unknown time  . atorvastatin (LIPITOR) 80 MG tablet Take 80 mg by mouth daily.    12/08/2016 at Unknown time  . atovaquone  (MEPRON) 750 MG/5ML suspension Take 10 mLs (1,500 mg total) by mouth daily with breakfast. 300 mL 5 12/09/2016 at Unknown time  . carvedilol (COREG) 3.125 MG tablet Take 1 tablet (3.125 mg total) by mouth 2 (two) times daily with a meal. 60 tablet 12 12/08/2016 at Unknown time  . Cholecalciferol (VITAMIN D3) 2000 UNITS TABS Take 2,000 mg by mouth daily.   12/09/2016 at Unknown time  . digoxin (LANOXIN) 0.125 MG tablet TAKE 1/2 TABLET BY MOUTH DAILY 15 tablet 3 12/09/2016 at Unknown time  . dolutegravir (TIVICAY) 50 MG tablet Take 1 tablet (50 mg total) by mouth daily. 30 tablet  12/09/2016 at Unknown time  . emtricitabine-tenofovir AF (DESCOVY) 200-25 MG tablet Take 1 tablet by mouth daily. 30 tablet  12/09/2016 at Unknown time  . feeding supplement, ENSURE ENLIVE, (ENSURE ENLIVE) LIQD Take 237 mLs by mouth 2 (two) times daily between meals. (Patient taking differently: Take 237 mLs by mouth 3 (three) times daily between meals. ) 237 mL 12 12/08/2016 at Unknown time  . ferrous sulfate 325 (65 FE) MG tablet Take 325 mg by mouth daily with breakfast.   12/09/2016 at Unknown time  . isosorbide dinitrate (ISORDIL) 10 MG  tablet Take 10 mg by mouth 3 (three) times daily.    12/09/2016 at Unknown time  . mirabegron ER (MYRBETRIQ) 25 MG TB24 tablet Take 25 mg by mouth daily.   12/09/2016 at Unknown time  . Multiple Vitamin (MULTIVITAMIN WITH MINERALS) TABS tablet Take 1 tablet by mouth daily.   12/09/2016 at Unknown time  . dronabinol (MARINOL) 2.5 MG capsule Take 1 capsule (2.5 mg total) by mouth 2 (two) times daily before lunch and supper. (Patient not taking: Reported on 12/09/2016)   Not Taking at Unknown time  . pantoprazole (PROTONIX) 40 MG tablet TAKE ONE TABLET   BY MOUTH   DAILY 30 tablet 11 Taking   Scheduled: . aspirin EC  325 mg Oral Daily  . atorvastatin  80 mg Oral q1800  . atovaquone  1,500 mg Oral Q breakfast  . dolutegravir  50 mg Oral Daily  . emtricitabine-tenofovir AF  1 tablet Oral Daily  .  enoxaparin (LOVENOX) injection  40 mg Subcutaneous Q24H  . feeding supplement (ENSURE ENLIVE)  237 mL Oral TID BM  . insulin aspart  0-5 Units Subcutaneous QHS  . insulin aspart  0-9 Units Subcutaneous TID WC  . mirabegron ER  25 mg Oral Daily  . pantoprazole  40 mg Oral Daily  . potassium chloride  40 mEq Oral BID   Continuous: . sodium chloride 75 mL/hr at 12/12/16 0110  . piperacillin-tazobactam (ZOSYN)  IV Stopped (12/12/16 0731)  . vancomycin 1,000 mg (12/12/16 0544)   [id number] **OR** acetaminophen, food thickener, ondansetron **OR** ondansetron (ZOFRAN) IV  Assesment: He was admitted with sepsis from urinary tract infection. 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 He is significantly better. He says he wants to go back to the skilled care facility but I think we need to see what antibiotic he needs to be on. I'm concerned about the fact that he still on IV antibiotics he still has a PICC line in place so I think we are better off to wait to see if we can get sensitivities to his organism and then plan discharge potentially on oral antibiotics rather than IV. He has multiple other medical problems including chronic combined systolic and diastolic heart failure which is about the same. He had acute kidney injury which is better. His potassium is low and that will be replaced. Active Problems:   CKD (chronic kidney disease)   Anemia of chronic kidney failure   Pancytopenia (HCC)   Severe malnutrition (HCC)   AKI (acute kidney injury) ([place])   HIV disease ([place])   Sepsis ([place])   Urinary tract infection with hematuria   Chronic combined systolic and diastolic CHF (congestive heart failure) (H[facility])   Severe sepsis with septic shock ([place])    Plan: As above. Replace potassium. Stop IV steroids.    LOS: 3 days   An Lannan L 12/12/2016, 9:01 AM

## 2016-12-12 NOTE — Clinical Social Work Note (Addendum)
Clinical Social Work Assessment  Patient Details  Name: Keith Young MRN: [id number] Date of Birth: [birth year]  Date of referral:  12/12/16               Reason for consult:  Discharge Planning                Permission sought to share information with:  Case Manager, Facility Sport and exercise psychologist, Family Supports Permission granted to share information::  Yes, Verbal Permission Granted  Name::        Agency::  [facility]  Relationship::  Brother, Jenny Reichmann and sister  Contact Information:     Housing/Transportation Living arrangements for the past 2 months:  [place] of Information:  Medical Team, Tourist information centre manager, Facility, Adult Children Patient Interpreter Needed:  None Criminal Activity/Legal Involvement Pertinent to Current Situation/Hospitalization:  No - Comment as needed Significant Relationships:  Adult Children, Other Family Members, Community Support Lives with:  Facility Resident Do you feel safe going back to the place where you live?  Yes Need for family participation in patient care:  Yes (Comment)  Care giving concerns:  Patient is a [facility] with last admission in April 2018. No concerns noted by facility or family at this time.   Social Worker assessment / plan:   LCSW following and consulted as patient is from SNF: [facility].  Plan will be to return at discharge. Requested PT consult for insurance purposes:  [employer].  Facility has been sent all information to being insurance auth. Will updated FL2 and continue to follow and assist with discharge needs.    Plan: [facility] to [facility] when medically stable.   Employment status:  Disabled (Comment on whether or not currently receiving Disability) Insurance information:  Programmer, applications, Medicaid In [place] PT Recommendations:  Not assessed at this time (requested PT consult) Information / Referral to community resources:  [place]  Patient/Family's  Response to care:  Agreeable  Patient/Family's Understanding of and Emotional Response to Diagnosis, Current Treatment, and Prognosis:  Still assessing.  Emotional Assessment Appearance:  Appears stated age Attitude/Demeanor/Rapport:  Unable to Assess Affect (typically observed):  Quiet, Calm Orientation:  Oriented to Self Alcohol / Substance use:  Not Applicable Psych involvement (Current and /or in the community):  No (Comment)  Discharge Needs  Concerns to be addressed:  Denies Needs/Concerns at this time Readmission within the last 30 days:  No Current discharge risk:  None Barriers to Discharge:  No Barriers Identified, Continued Medical Work up   Lilly Cove, LCSW 12/12/2016, 11:30 AM

## 2016-12-13 ENCOUNTER — Inpatient Hospital Stay
Admission: RE | Admit: 2016-12-13 | Discharge: 2018-07-06 | Disposition: A | Discharge status: Other Acute Inpatient Hospital | Payer: Medicare HMO | Source: Ambulatory Visit | Attending: Internal Medicine | Admitting: Internal Medicine

## 2016-12-13 DIAGNOSIS — G9341 Metabolic encephalopathy: Secondary | ICD-10-CM | POA: Diagnosis present

## 2016-12-13 LAB — BASIC METABOLIC PANEL
ANION GAP: 6 (ref 5–15)
BUN: 34 mg/dL — ABNORMAL HIGH (ref 6–20)
CHLORIDE: 110 mmol/L (ref 101–111)
CO2: 23 mmol/L (ref 22–32)
Calcium: 8 mg/dL — ABNORMAL LOW (ref 8.9–10.3)
Creatinine, Ser: 1.4 mg/dL — ABNORMAL HIGH (ref 0.61–1.24)
GFR calc non Af Amer: 52 mL/min — ABNORMAL LOW (ref >60)
GFR, EST AFRICAN AMERICAN: 60 mL/min — AB (ref >60)
Glucose, Bld: 85 mg/dL (ref 65–99)
POTASSIUM: 3.4 mmol/L — AB (ref 3.5–5.1)
Sodium: 139 mmol/L (ref 135–145)

## 2016-12-13 LAB — GLUCOSE, CAPILLARY
GLUCOSE-CAPILLARY: 143 mg/dL — AB (ref 65–99)
Glucose-Capillary: 104 mg/dL — ABNORMAL HIGH (ref 65–99)

## 2016-12-13 MED ORDER — AMOXICILLIN 500 MG PO CAPS
500.0000 mg | ORAL_CAPSULE | Freq: Three times a day (TID) | ORAL | 0 refills | Status: DC
Start: 2016-12-13 — End: 2017-02-14

## 2016-12-13 NOTE — Evaluation (Signed)
Physical Therapy Evaluation Patient Details Name: Keith Young MRN: [id number] DOB: [birth year] Today's Date: 12/13/2016   History of Present Illness  Keith Young is a 66yo black male who comes to [facility] on 7/13 with lethargy adn AMS, found to be hypotensive and admitted for sepsis related to UTI. PMH: CHF, HIV/AIDS, CKD3. Pt has been at [facility] since february, [place] working with rehab, progressinf to AMB short distances with RW and minA c persistent posterior lean, and has since been DC from rehab and transitioned to LTC. Pt primarily uses WC fro mobility needs in facility.    Clinical Impression  Pt admitted with above diagnosis. Pt currently with functional limitations due to the deficits listed below (see "PT Problem List"). Pt is awake in bed upon entry, agreeable to participate, denies pain and acute distress. Pt is a poor historian, with somewhat vague and/or tangential responses to questioning, hence PLOF is gathered from rehab staff at [facility] where he resides Pt requires modA for bed mobility, modA for transfers, and tolerates only less than 10 feet AMB requiring heavy physical assistance, all revealing of decline in strength and functional mobility compared to his PLOF. Pt will benefit from skilled PT intervention to increase independence and safety with basic mobility in preparation for discharge to the venue listed below.       Follow Up Recommendations SNF    Equipment Recommendations  None recommended by PT    Recommendations for Other Services       Precautions / Restrictions Precautions Precautions: None      Mobility  Bed Mobility Overal bed mobility: Needs Assistance Bed Mobility: Sidelying to Sit;Sit to Supine   Sidelying to sit: Supervision (req maximal effort )   Sit to supine: Mod assist (modA required for supine and scooting. )      Transfers Overall transfer level: Needs assistance Equipment used: Rolling walker (2 wheeled) Transfers: Sit to/from  Stand Sit to Stand: Mod assist         General transfer comment: persistent posterior lean, with multiple LOB retropulsive, without self correction.   Ambulation/Gait Ambulation/Gait assistance: Min assist Ambulation Distance (Feet): 6 Feet Assistive device: Rolling walker (2 wheeled)       General Gait Details: requires constant support to combat posterior lean and prevent falling.   Stairs            Wheelchair Mobility    Modified Rankin (Stroke Patients Only)       Balance Overall balance assessment: Needs assistance                                           Pertinent Vitals/Pain Pain Assessment: No/denies pain    Home Living Family/patient expects to be discharged to:: Skilled nursing facility                      Prior Function Level of Independence: Needs assistance   Gait / Transfers Assistance Needed: WC for mobility in facility, transfers minA with a RW   ADL's / Homemaking Assistance Needed: assistance with dressing and bathing.         Hand Dominance   Dominant Hand: Right    Extremity/Trunk Assessment   Upper Extremity Assessment Upper Extremity Assessment: Generalized weakness    Lower Extremity Assessment Lower Extremity Assessment: Generalized weakness    Cervical / Trunk Assessment Cervical / Trunk Assessment:  (  evident weakness during bed mobility)  Communication      Cognition Arousal/Alertness: Awake/alert Behavior During Therapy: WFL for tasks assessed/performed Overall Cognitive Status: No family/caregiver present to determine baseline cognitive functioning                                 General Comments: poor historian, resonses often tangential to questioning. Reports he is not having difficulty hearing PT.       General Comments      Exercises     Assessment/Plan    PT Assessment Patient needs continued PT services  PT Problem List Decreased strength;Decreased  activity tolerance;Decreased balance;Decreased cognition;Decreased knowledge of use of DME;Decreased mobility       PT Treatment Interventions DME instruction;Therapeutic activities;Therapeutic exercise;Balance training;Functional mobility training;Cognitive remediation    PT Goals (Current goals can be found in the Care Plan section)  Acute Rehab PT Goals Patient Stated Goal: regain strength for self care PT Goal Formulation: With patient Time For Goal Achievement: 12/27/16 Potential to Achieve Goals: Good    Frequency Min 2X/week   Barriers to discharge        Co-evaluation               AM-PAC PT "6 Clicks" Daily Activity  Outcome Measure Difficulty turning over in bed (including adjusting bedclothes, sheets and blankets)?: A Lot Difficulty moving from lying on back to sitting on the side of the bed? : A Lot Difficulty sitting down on and standing up from a chair with arms (e.g., wheelchair, bedside commode, etc,.)?: Total Help needed moving to and from a bed to chair (including a wheelchair)?: Total Help needed walking in hospital room?: Total Help needed climbing 3-5 steps with a railing? : Total 6 Click Score: 8    End of Session   Activity Tolerance: Patient tolerated treatment well;Patient limited by fatigue Patient left: with bed alarm set;with call bell/phone within reach;in bed (bed in chair position )   PT Visit Diagnosis: Unsteadiness on feet (R26.81);Muscle weakness (generalized) (M62.81)    Time: 4917-9150 PT Time Calculation (min) (ACUTE ONLY): 14 min   Charges:   PT Evaluation $PT Eval Moderate Complexity: 1 Procedure     PT G Codes:        10:15 AM, December 25, 2016 Etta Grandchild, PT, DPT Physical Therapist - [place] [phone] (Office)   Brittinie Wherley C 25-Dec-2016, 10:12 AM

## 2016-12-13 NOTE — Progress Notes (Signed)
He feels well and wants to return to his skilled care facility. His bacteria in his urine is sensitive to oral antibiotics so I think we can treat him now with oral antibiotics and he can return to the skilled care facility.

## 2016-12-13 NOTE — Care Management Important Message (Signed)
Important Message  Patient Details  Name: Keith Young MRN: [id number] Date of Birth: [birth year]   Medicare Important Message Given:  Yes    Herbie Lehrmann, Chauncey Reading, RN 12/13/2016, 12:32 PM

## 2016-12-13 NOTE — Clinical Social Work Note (Signed)
LCSW notified patient's brother in law, Jeronimo Greaves, that patient was discharging and notified Marianna Fuss at [facility] that patient was discharging via voicemail message.     Analise Glotfelty, Clydene Pugh, LCSW

## 2016-12-13 NOTE — Progress Notes (Signed)
Physician Discharge Summary  Patient ID: Keith Young MRN: [id number] DOB/AGE: Oct 05, 1950 66 y.o. Primary Care Physician:Jaivion Kingsley, Percell Miller, MD Admit date: 12/09/2016 Discharge date: 12/13/2016    Discharge Diagnoses:   Active Problems:   CKD (chronic kidney disease)   Anemia of chronic kidney failure   Pancytopenia (HCC)   Severe malnutrition (HCC)   AKI (acute kidney injury) ([place])   HIV disease ([place])   Sepsis ([place])   Urinary tract infection with hematuria   Chronic combined systolic and diastolic CHF (congestive heart failure) (HCC)   Severe sepsis with septic shock (HCC)   Metabolic encephalopathy   Allergies as of 12/13/2016   No Known Allergies     Medication List    STOP taking these medications   dronabinol 2.5 MG capsule Commonly known as:  MARINOL     TAKE these medications   acetaminophen 325 MG tablet Commonly known as:  TYLENOL Take 2 tablets (650 mg total) by mouth every 4 (four) hours as needed for mild pain (or temp > 37.5 C (99.5 F)).   amoxicillin 500 MG capsule Commonly known as:  AMOXIL Take 1 capsule (500 mg total) by mouth 3 (three) times daily.   aspirin 325 MG tablet Take 1 tablet (325 mg total) by mouth daily. What changed:  additional instructions   atorvastatin 80 MG tablet Commonly known as:  LIPITOR Take 80 mg by mouth daily.   atovaquone 750 MG/5ML suspension Commonly known as:  MEPRON Take 10 mLs (1,500 mg total) by mouth daily with breakfast.   carvedilol 3.125 MG tablet Commonly known as:  COREG Take 1 tablet (3.125 mg total) by mouth 2 (two) times daily with a meal.   digoxin 0.125 MG tablet Commonly known as:  LANOXIN TAKE 1/2 TABLET BY MOUTH DAILY   dolutegravir 50 MG tablet Commonly known as:  TIVICAY Take 1 tablet (50 mg total) by mouth daily.   emtricitabine-tenofovir AF 200-25 MG tablet Commonly known as:  DESCOVY Take 1 tablet by mouth daily.   feeding supplement (ENSURE ENLIVE) Liqd Take 237 mLs by mouth 2  (two) times daily between meals. What changed:  when to take this   ferrous sulfate 325 (65 FE) MG tablet Take 325 mg by mouth daily with breakfast.   isosorbide dinitrate 10 MG tablet Commonly known as:  ISORDIL Take 10 mg by mouth 3 (three) times daily.   multivitamin with minerals Tabs tablet Take 1 tablet by mouth daily.   MYRBETRIQ 25 MG Tb24 tablet Generic drug:  mirabegron ER Take 25 mg by mouth daily.   pantoprazole 40 MG tablet Commonly known as:  PROTONIX TAKE ONE TABLET   BY MOUTH   DAILY   Vitamin D3 2000 units Tabs Take 2,000 mg by mouth daily.       Discharged Condition:Improved    Consults: None  Significant Diagnostic Studies: Dg Chest 2 View  Result Date: 12/09/2016 CLINICAL DATA:  Sepsis EXAM: CHEST  2 VIEW COMPARISON:  09/05/2016 FINDINGS: Left AICD remains in place, unchanged. Prior median sternotomy. Mild cardiomegaly. Lungs are clear. No effusions or acute bony abnormality. IMPRESSION: Cardiomegaly.  No active disease. Electronically Signed   By: Rolm Baptise M.D.   On: 12/09/2016 11:30    Lab Results: Basic Metabolic Panel:  Recent Labs  12/12/16 0651 12/13/16 0433  NA 146* 139  K 3.0* 3.4*  CL 117* 110  CO2 24 23  GLUCOSE 254* 85  BUN 47* 34*  CREATININE 1.56* 1.40*  CALCIUM 8.8* 8.0*  Liver Function Tests:  Recent Labs  12/11/16 0421  AST 36  ALT 54  ALKPHOS 41  BILITOT 0.7  PROT 5.9*  ALBUMIN 2.2*     CBC:  Recent Labs  12/11/16 0421  WBC 7.0  NEUTROABS 6.5  HGB 8.8*  HCT 27.9*  MCV 93.6  PLT 91*    Recent Results (from the past 240 hour(s))  Blood Culture (routine x 2)     Status: None (Preliminary result)   Collection Time: 12/09/16 10:37 AM  Result Value Ref Range Status   Specimen Description BLOOD LEFT HAND  Final   Special Requests   Final    BOTTLES DRAWN AEROBIC AND ANAEROBIC Blood Culture results may not be optimal due to an inadequate volume of blood received in culture bottles   Culture NO  GROWTH 4 DAYS  Final   Report Status PENDING  Incomplete  Blood Culture (routine x 2)     Status: None (Preliminary result)   Collection Time: 12/09/16 10:39 AM  Result Value Ref Range Status   Specimen Description BLOOD RIGHT HAND  Final   Special Requests   Final    AEROBIC BOTTLE ONLY Blood Culture results may not be optimal due to an inadequate volume of blood received in culture bottles   Culture NO GROWTH 4 DAYS  Final   Report Status PENDING  Incomplete  MRSA PCR Screening     Status: None   Collection Time: 12/09/16  4:50 PM  Result Value Ref Range Status   MRSA by PCR NEGATIVE NEGATIVE Final    Comment:        The GeneXpert MRSA Assay (FDA approved for NASAL specimens only), is one component of a comprehensive MRSA colonization surveillance program. It is not intended to diagnose MRSA infection nor to guide or monitor treatment for MRSA infections.   Urine culture     Status: Abnormal   Collection Time: 12/09/16  5:12 PM  Result Value Ref Range Status   Specimen Description URINE, CLEAN CATCH  Final   Special Requests NONE  Final   Culture >=100,000 COLONIES/mL PROTEUS MIRABILIS (A)  Final   Report Status 12/12/2016 FINAL  Final   Organism ID, Bacteria PROTEUS MIRABILIS (A)  Final      Susceptibility   Proteus mirabilis - MIC*    AMPICILLIN <=2 SENSITIVE Sensitive     CEFAZOLIN <=4 SENSITIVE Sensitive     CEFTRIAXONE <=1 SENSITIVE Sensitive     CIPROFLOXACIN >=4 RESISTANT Resistant     GENTAMICIN <=1 SENSITIVE Sensitive     IMIPENEM 1 SENSITIVE Sensitive     NITROFURANTOIN 128 RESISTANT Resistant     TRIMETH/SULFA <=20 SENSITIVE Sensitive     AMPICILLIN/SULBACTAM <=2 SENSITIVE Sensitive     PIP/TAZO <=4 SENSITIVE Sensitive     * >=100,000 COLONIES/mL PROTEUS MIRABILIS  C difficile quick scan w PCR reflex     Status: None   Collection Time: 12/10/16  4:08 PM  Result Value Ref Range Status   C Diff antigen NEGATIVE NEGATIVE Final   C Diff toxin NEGATIVE  NEGATIVE Final   C Diff interpretation No C. difficile detected.  Final     Hospital Course: This is a 66 year old with multiple medical problems including HIV/AIDS, coronary disease, congestive heart failure, previous stroke, malnutrition with association to his HIV AIDS hypertension chronic kidney disease. He lives at a skilled care facility and came to the hospital because of being poorly responsive. He was found to be septic and was treated for  sepsis with fluid resuscitation and broad-spectrum antibiotics. He grew Proteus in his urine that was sensitive to many oral antibiotics. He initially had PICC line placed because of poor venous access and that has been removed. He is back at baseline and ready for transfer back to the skilled care facility.  Discharge Exam: Blood pressure 130/90, pulse 71, temperature 98 F (36.7 C), temperature source Oral, resp. rate 15, height 5\' 4"  (1.626 m), weight 58.5 kg (128 lb 15.5 oz), SpO2 100 %. He's awake and alert. Chest is clear. Heart is regular.  Disposition: Back to skilled care facility. He will take amoxicillin 500 mg 3 times a day 7 days  Discharge Instructions    Diet - low sodium heart healthy    Complete by:  As directed    Increase activity slowly    Complete by:  As directed       Contact information for after-discharge care    [facility] .   Specialty:  Skilled Nursing Facility Contact information: [address] [phone]              Signed: [name]   12/13/2016, 8:51 AM

## 2016-12-14 LAB — CULTURE, BLOOD (ROUTINE X 2)
Culture: NO GROWTH
Culture: NO GROWTH

## 2016-12-19 NOTE — Discharge Summary (Signed)
[] Hide copied text [] Hover for attribution information Physician Discharge Summary  Patient ID: Keith Young MRN: [id number] DOB/AGE: [birth year] 66 y.o. Primary Care Physician:Anneka Studer, Percell Miller, MD Admit date: 12/09/2016 Discharge date: 12/13/2016    Discharge Diagnoses:   Active Problems:   CKD (chronic kidney disease)   Anemia of chronic kidney failure   Pancytopenia (HCC)   Severe malnutrition (HCC)   AKI (acute kidney injury) ([place])   HIV disease ([place])   Sepsis ([place])   Urinary tract infection with hematuria   Chronic combined systolic and diastolic CHF (congestive heart failure) (HCC)   Severe sepsis with septic shock (HCC)   Metabolic encephalopathy   Allergies as of 12/13/2016   No Known Allergies             Medication List     STOP taking these medications   dronabinol 2.5 MG capsule Commonly known as:  MARINOL     TAKE these medications   acetaminophen 325 MG tablet Commonly known as:  TYLENOL Take 2 tablets (650 mg total) by mouth every 4 (four) hours as needed for mild pain (or temp > 37.5 C (99.5 F)).   amoxicillin 500 MG capsule Commonly known as:  AMOXIL Take 1 capsule (500 mg total) by mouth 3 (three) times daily.   aspirin 325 MG tablet Take 1 tablet (325 mg total) by mouth daily. What changed:  additional instructions   atorvastatin 80 MG tablet Commonly known as:  LIPITOR Take 80 mg by mouth daily.   atovaquone 750 MG/5ML suspension Commonly known as:  MEPRON Take 10 mLs (1,500 mg total) by mouth daily with breakfast.   carvedilol 3.125 MG tablet Commonly known as:  COREG Take 1 tablet (3.125 mg total) by mouth 2 (two) times daily with a meal.   digoxin 0.125 MG tablet Commonly known as:  LANOXIN TAKE 1/2 TABLET BY MOUTH DAILY   dolutegravir 50 MG tablet Commonly known as:  TIVICAY Take 1 tablet (50 mg total) by mouth daily.   emtricitabine-tenofovir AF 200-25 MG tablet Commonly known as:  DESCOVY Take 1  tablet by mouth daily.   feeding supplement (ENSURE ENLIVE) Liqd Take 237 mLs by mouth 2 (two) times daily between meals. What changed:  when to take this   ferrous sulfate 325 (65 FE) MG tablet Take 325 mg by mouth daily with breakfast.   isosorbide dinitrate 10 MG tablet Commonly known as:  ISORDIL Take 10 mg by mouth 3 (three) times daily.   multivitamin with minerals Tabs tablet Take 1 tablet by mouth daily.   MYRBETRIQ 25 MG Tb24 tablet Generic drug:  mirabegron ER Take 25 mg by mouth daily.   pantoprazole 40 MG tablet Commonly known as:  PROTONIX TAKE ONE TABLET   BY MOUTH   DAILY   Vitamin D3 2000 units Tabs Take 2,000 mg by mouth daily.       Discharged Condition:Improved    Consults: None  Significant Diagnostic Studies:  Imaging Results  Dg Chest 2 View  Result Date: 12/09/2016 CLINICAL DATA:  Sepsis EXAM: CHEST  2 VIEW COMPARISON:  09/05/2016 FINDINGS: Left AICD remains in place, unchanged. Prior median sternotomy. Mild cardiomegaly. Lungs are clear. No effusions or acute bony abnormality. IMPRESSION: Cardiomegaly.  No active disease. Electronically Signed   By: Rolm Baptise M.D.   On: 12/09/2016 11:30     Lab Results: Basic Metabolic Panel:  Recent Labs (last 2 labs)    Recent Labs  12/12/16 0651 12/13/16 0433  NA 146* 139  K 3.0* 3.4*  CL 117* 110  CO2 24 23  GLUCOSE 254* 85  BUN 47* 34*  CREATININE 1.56* 1.40*  CALCIUM 8.8* 8.0*     Liver Function Tests:  Recent Labs (last 2 labs)    Recent Labs  12/11/16 0421  AST 36  ALT 54  ALKPHOS 41  BILITOT 0.7  PROT 5.9*  ALBUMIN 2.2*       CBC:  Recent Labs (last 2 labs)    Recent Labs  12/11/16 0421  WBC 7.0  NEUTROABS 6.5  HGB 8.8*  HCT 27.9*  MCV 93.6  PLT 91*             Recent Results (from the past 240 hour(s))  Blood Culture (routine x 2)     Status: None (Preliminary result)   Collection Time: 12/09/16 10:37 AM  Result Value  Ref Range Status   Specimen Description BLOOD LEFT HAND  Final   Special Requests   Final    BOTTLES DRAWN AEROBIC AND ANAEROBIC Blood Culture results may not be optimal due to an inadequate volume of blood received in culture bottles   Culture NO GROWTH 4 DAYS  Final   Report Status PENDING  Incomplete  Blood Culture (routine x 2)     Status: None (Preliminary result)   Collection Time: 12/09/16 10:39 AM  Result Value Ref Range Status   Specimen Description BLOOD RIGHT HAND  Final   Special Requests   Final    AEROBIC BOTTLE ONLY Blood Culture results may not be optimal due to an inadequate volume of blood received in culture bottles   Culture NO GROWTH 4 DAYS  Final   Report Status PENDING  Incomplete  MRSA PCR Screening     Status: None   Collection Time: 12/09/16  4:50 PM  Result Value Ref Range Status   MRSA by PCR NEGATIVE NEGATIVE Final    Comment:        The GeneXpert MRSA Assay (FDA approved for NASAL specimens only), is one component of a comprehensive MRSA colonization surveillance program. It is not intended to diagnose MRSA infection nor to guide or monitor treatment for MRSA infections.   Urine culture     Status: Abnormal   Collection Time: 12/09/16  5:12 PM  Result Value Ref Range Status   Specimen Description URINE, CLEAN CATCH  Final   Special Requests NONE  Final   Culture >=100,000 COLONIES/mL PROTEUS MIRABILIS (A)  Final   Report Status 12/12/2016 FINAL  Final   Organism ID, Bacteria PROTEUS MIRABILIS (A)  Final      Susceptibility   Proteus mirabilis - MIC*    AMPICILLIN <=2 SENSITIVE Sensitive     CEFAZOLIN <=4 SENSITIVE Sensitive     CEFTRIAXONE <=1 SENSITIVE Sensitive     CIPROFLOXACIN >=4 RESISTANT Resistant     GENTAMICIN <=1 SENSITIVE Sensitive     IMIPENEM 1 SENSITIVE Sensitive     NITROFURANTOIN 128 RESISTANT Resistant     TRIMETH/SULFA <=20 SENSITIVE Sensitive      AMPICILLIN/SULBACTAM <=2 SENSITIVE Sensitive     PIP/TAZO <=4 SENSITIVE Sensitive     * >=100,000 COLONIES/mL PROTEUS MIRABILIS  C difficile quick scan w PCR reflex     Status: None   Collection Time: 12/10/16  4:08 PM  Result Value Ref Range Status   C Diff antigen NEGATIVE NEGATIVE Final   C Diff toxin NEGATIVE NEGATIVE Final   C Diff interpretation No C. difficile detected.  Final  Hospital Course: This is a 67 year old with multiple medical problems including HIV/AIDS, coronary disease, congestive heart failure, previous stroke, malnutrition with association to his HIV AIDS hypertension chronic kidney disease. He lives at a skilled care facility and came to the hospital because of being poorly responsive. He was found to be septic and was treated for sepsis with fluid resuscitation and broad-spectrum antibiotics. He grew Proteus in his urine that was sensitive to many oral antibiotics. He initially had PICC line placed because of poor venous access and that has been removed. He is back at baseline and ready for transfer back to the skilled care facility.  Discharge Exam: Blood pressure 130/90, pulse 71, temperature 98 F (36.7 C), temperature source Oral, resp. rate 15, height 5\' 4"  (1.626 m), weight 58.5 kg (128 lb 15.5 oz), SpO2 100 %. He's awake and alert. Chest is clear. Heart is regular.  Disposition: Back to skilled care facility. He will take amoxicillin 500 mg 3 times a day 7 days      Discharge Instructions    Diet - low sodium heart healthy    Complete by:  As directed    Increase activity slowly    Complete by:  As directed          Contact information for after-discharge care        [facility] .   Specialty:  Skilled Nursing Facility Contact information: [address] [place] [phone]               Signed: [name]   12/13/2016, 8:51  AM

## 2016-12-20 ENCOUNTER — Encounter (HOSPITAL_COMMUNITY): Payer: Self-pay

## 2016-12-20 ENCOUNTER — Encounter (HOSPITAL_BASED_OUTPATIENT_CLINIC_OR_DEPARTMENT_OTHER): Payer: Medicare HMO

## 2016-12-20 ENCOUNTER — Encounter (HOSPITAL_COMMUNITY): Payer: Medicare HMO | Attending: Oncology

## 2016-12-20 VITALS — BP 119/62 | HR 85 | Temp 98.2°F | Resp 16

## 2016-12-20 DIAGNOSIS — N183 Chronic kidney disease, stage 3 unspecified: Secondary | ICD-10-CM

## 2016-12-20 DIAGNOSIS — D631 Anemia in chronic kidney disease: Secondary | ICD-10-CM | POA: Insufficient documentation

## 2016-12-20 DIAGNOSIS — D61818 Other pancytopenia: Secondary | ICD-10-CM | POA: Diagnosis present

## 2016-12-20 DIAGNOSIS — N189 Chronic kidney disease, unspecified: Secondary | ICD-10-CM

## 2016-12-20 LAB — CBC WITH DIFFERENTIAL/PLATELET
Basophils Absolute: 0 10*3/uL (ref 0.0–0.1)
Basophils Relative: 1 %
EOS ABS: 0.1 10*3/uL (ref 0.0–0.7)
EOS PCT: 3 %
HCT: 32.8 % — ABNORMAL LOW (ref 39.0–52.0)
Hemoglobin: 10.6 g/dL — ABNORMAL LOW (ref 13.0–17.0)
LYMPHS ABS: 0.7 10*3/uL (ref 0.7–4.0)
Lymphocytes Relative: 23 %
MCH: 29.9 pg (ref 26.0–34.0)
MCHC: 32.3 g/dL (ref 30.0–36.0)
MCV: 92.4 fL (ref 78.0–100.0)
MONO ABS: 0.4 10*3/uL (ref 0.1–1.0)
MONOS PCT: 15 %
Neutro Abs: 1.7 10*3/uL (ref 1.7–7.7)
Neutrophils Relative %: 58 %
PLATELETS: 164 10*3/uL (ref 150–400)
RBC: 3.55 MIL/uL — ABNORMAL LOW (ref 4.22–5.81)
RDW: 15.2 % (ref 11.5–15.5)
WBC: 2.9 10*3/uL — ABNORMAL LOW (ref 4.0–10.5)

## 2016-12-20 MED ORDER — DARBEPOETIN ALFA 60 MCG/0.3ML IJ SOSY
50.0000 ug | PREFILLED_SYRINGE | Freq: Once | INTRAMUSCULAR | Status: AC
  Administered 2016-12-20: 50 ug via SUBCUTANEOUS

## 2016-12-20 MED ORDER — DARBEPOETIN ALFA 60 MCG/0.3ML IJ SOSY
PREFILLED_SYRINGE | INTRAMUSCULAR | Status: AC
  Filled 2016-12-20: qty 0.3

## 2016-12-20 NOTE — Progress Notes (Signed)
Keith Young presents today for injection per the provider's orders.  Aranesp administration without incident; see MAR for injection details.  Patient tolerated procedure well and without incident.  No questions or complaints noted at this time.  Discharged via wheelchair in [place] staff.

## 2017-01-03 ENCOUNTER — Encounter (HOSPITAL_COMMUNITY): Payer: Medicare HMO | Attending: Oncology | Admitting: Oncology

## 2017-01-03 ENCOUNTER — Encounter (HOSPITAL_COMMUNITY): Payer: Medicare HMO

## 2017-01-03 VITALS — BP 117/66 | HR 79 | Temp 97.8°F | Resp 16

## 2017-01-03 DIAGNOSIS — N183 Chronic kidney disease, stage 3 (moderate): Secondary | ICD-10-CM | POA: Insufficient documentation

## 2017-01-03 DIAGNOSIS — D61818 Other pancytopenia: Secondary | ICD-10-CM

## 2017-01-03 DIAGNOSIS — N189 Chronic kidney disease, unspecified: Secondary | ICD-10-CM

## 2017-01-03 DIAGNOSIS — D631 Anemia in chronic kidney disease: Secondary | ICD-10-CM | POA: Insufficient documentation

## 2017-01-03 LAB — CBC WITH DIFFERENTIAL/PLATELET
BASOS ABS: 0 10*3/uL (ref 0.0–0.1)
Basophils Relative: 0 %
EOS PCT: 7 %
Eosinophils Absolute: 0.2 10*3/uL (ref 0.0–0.7)
HCT: 38.8 % — ABNORMAL LOW (ref 39.0–52.0)
Hemoglobin: 12.3 g/dL — ABNORMAL LOW (ref 13.0–17.0)
LYMPHS PCT: 17 %
Lymphs Abs: 0.5 10*3/uL — ABNORMAL LOW (ref 0.7–4.0)
MCH: 30.6 pg (ref 26.0–34.0)
MCHC: 31.7 g/dL (ref 30.0–36.0)
MCV: 96.5 fL (ref 78.0–100.0)
MONO ABS: 0.5 10*3/uL (ref 0.1–1.0)
Monocytes Relative: 18 %
Neutro Abs: 1.5 10*3/uL — ABNORMAL LOW (ref 1.7–7.7)
Neutrophils Relative %: 58 %
PLATELETS: 128 10*3/uL — AB (ref 150–400)
RBC: 4.02 MIL/uL — ABNORMAL LOW (ref 4.22–5.81)
RDW: 14.5 % (ref 11.5–15.5)
WBC: 2.7 10*3/uL — ABNORMAL LOW (ref 4.0–10.5)

## 2017-01-03 NOTE — Progress Notes (Signed)
Aranesp held today for hgb of 12.3.

## 2017-01-03 NOTE — Progress Notes (Signed)
[place], [place] [address]   CLINIC:  Medical Oncology/Hematology Progress Note  PCP:  Sinda Du, [place] [address] [phone]   REASON FOR VISIT:  Follow-up for anemia in setting of CKD  CURRENT THERAPY: Aranesp 50 mcg every 2 weeks   HISTORY OF PRESENT ILLNESS:  (From [name], PA-C's last note on 04/11/16)     INTERVAL HISTORY:  [name] returns for follow-up for anemia, in the setting of Stage III-IV chronic kidney disease.   He has been having decreased appetite and was in the hospital in July for failure to thrive. He has been placed on marinol with improvement in his appetite. He denies any chest pain, shortness of breath, abdominal pain, focal weakness. He last received aranesp on 12/20/16. Hemoglobin is 12.3 g/dL today.  REVIEW OF SYSTEMS:  Review of Systems  Constitutional: Positive for appetite change.  HENT:  Negative.   Eyes: Negative.   Respiratory: Negative.   Cardiovascular: Negative.  Negative for chest pain.  Gastrointestinal: Negative.  Negative for abdominal pain.  Endocrine: Negative.   Genitourinary: Negative.    Musculoskeletal: Negative.   Skin: Negative.   Neurological: Negative.   Hematological: Negative.   Psychiatric/Behavioral: Negative.   All other systems reviewed and are negative.    PAST MEDICAL/SURGICAL HISTORY:  Past Medical History:  Diagnosis Date  . AICD (automatic cardioverter/defibrillator) present   . Alcoholism in remission ([place])   . Anemia   . Aortic insufficiency   . Arthritis   . At moderate risk for fall   . Bell's palsy   . Cardiomyopathy ([place])   . Chronic systolic heart failure (HCC)    NYHA Class III  . CKD (chronic kidney disease) stage 3, GFR 30-59 ml/min   . COPD (chronic obstructive pulmonary disease) ([place])   . Essential hypertension, benign   . HIV disease ([place]) 09/06/2016  . Hyperlipidemia   . Noncompliance  with medication regimen   . Nonischemic cardiomyopathy (HCC)    EF 15-20%  . NSVT (nonsustained ventricular tachycardia) ([place])   . Numbness of right jaw    Had a stoke in 01/2013. Numbness is occasional, especially when trying to chew.  . Productive cough 08/2013   With brown sputum   . SOB (shortness of breath) on exertion 08/2013  . Stroke ([place]) 01/2013   weakness of right side from CVA  . Type 2 diabetes mellitus ([place])   . Uses hearing aid 2014   recently received new hearing aids   Past Surgical History:  Procedure Laterality Date  . BIOPSY N/A 04/10/2014   Procedure: GASTRIC BIOPSY;  Surgeon: Daneil Dolin, MD;  Location: [facility];  Service: Endoscopy;  Laterality: N/A;  . BIOPSY  10/12/2015   Procedure: BIOPSY;  Surgeon: Daneil Dolin, MD;  Location: [facility] SU[facility];  Service: Endoscopy;;  stomach bx's  . CARDIAC DEFIBRILLATOR PLACEMENT  11/12/12   [employer] Inogen MINI ICD implanted in [place] at [place] [facility] per Dr Nelly Laurence note  . COLONOSCOPY WITH PROPOFOL N/A 04/10/2014   RMR: Colonic Diverticulosis  . ESOPHAGOGASTRODUODENOSCOPY (EGD) WITH PROPOFOL N/A 04/10/2014   RMR: Mild erosive reflux esophagitis. Multiple antral polyps likely hyperplastic status post removal by hot snare cautery technique. Diffusely abnormal stomach status post gastric biopsy I suspect some of patients anemaia may be due to intermittent oozing from the stomach. It would be difficult  and a risky proposition to attempt complete removal of all of his gastric polyps.  . ESOPHAGOGASTRODUODENOSCOPY (EGD) WITH PROPOFOL N/A 10/12/2015   Procedure: ESOPHAGOGASTRODUODENOSCOPY (EGD) WITH PROPOFOL;  Surgeon: Daneil Dolin, MD;  Location: [facility];  Service: Endoscopy;  Laterality: N/A;  8938 - moved to 9:15  . POLYPECTOMY N/A 04/10/2014   Procedure: GASTRIC POLYPECTOMY;  Surgeon: Daneil Dolin, MD;  Location: [facility];  Service: Endoscopy;  Laterality: N/A;    . RIGHT HEART CATHETERIZATION N/A 02/06/2014   Procedure: RIGHT HEART CATH;  Surgeon: Larey Dresser, MD;  Location: [facility] CATH LAB;  Service: Cardiovascular;  Laterality: N/A;     SOCIAL HISTORY:  Social History   Social History  . Marital status: Single    Spouse name: N/A  . Number of children: N/A  . Years of education: N/A   Occupational History  . disabled    Social History Main Topics  . Smoking status: Former Smoker    Types: Cigarettes    Quit date: 11/05/1993  . Smokeless tobacco: Never Used  . Alcohol use Yes     Comment: former heavy ETOH, none recently  . Drug use: No     Comment: prior history of cocaine use, smoking   . Sexual activity: Yes    Birth control/ protection: None   Other Topics Concern  . Not on file   Social History Narrative   Recently moved from [place] to be with family in [place] (2015).  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  Has h/o polysubstance abuse.  Baseline - able to ambulate, mild cognitive delay, able to toilet and shower himself, occasional use of walker/cane.                FAMILY HISTORY:  Family History  Problem Relation Age of Onset  . Kidney disease Mother   . Kidney disease Sister   . Colon cancer Neg Hx     CURRENT MEDICATIONS:  Outpatient Encounter Prescriptions as of 07/18/2016  Medication Sig  . acetaminophen (TYLENOL) 325 MG tablet Take 2 tablets (650 mg total) by mouth every 4 (four) hours as needed for mild pain (or temp > 37.5 C (99.5 F)).  Marland Kitchen aspirin 325 MG tablet Take 1 tablet (325 mg total) by mouth daily.  Marland Kitchen atorvastatin (LIPITOR) 80 MG tablet Take 80 mg by mouth daily.   . carvedilol (COREG) 25 MG tablet Take 12.5 mg by mouth 2 (two) times daily with a meal.   . Cholecalciferol (VITAMIN D3) 2000 UNITS TABS Take 2,000 mg by mouth daily.  . digoxin (LANOXIN) 0.125 MG tablet TAKE 1/2 TABLET BY MOUTH DAILY  . feeding supplement, ENSURE ENLIVE, (ENSURE ENLIVE) LIQD Take 237 mLs by  mouth 2 (two) times daily between meals.  . ferrous sulfate 325 (65 FE) MG tablet Take 325 mg by mouth daily with breakfast.  . furosemide (LASIX) 40 MG tablet Take 40 mg by mouth daily.  . isosorbide dinitrate (ISORDIL) 20 MG tablet Take 40 mg by mouth 3 (three) times daily.   Marland Kitchen losartan (COZAAR) 25 MG tablet Take 25 mg by mouth daily.  . mirabegron ER (MYRBETRIQ) 25 MG TB24 tablet Take 25 mg by mouth daily.  . mirtazapine (REMERON) 7.5 MG tablet Take 1 tablet by mouth daily.  . Multiple Vitamin (MULTIVITAMIN WITH MINERALS) TABS tablet Take 1 tablet by mouth daily.  . pantoprazole (PROTONIX) 40 MG tablet TAKE ONE TABLET   BY MOUTH  DAILY  . fluconazole (DIFLUCAN) 100 MG tablet Take 1 tablet (100 mg total) by mouth daily.   No facility-administered encounter medications on file as of 07/18/2016.     ALLERGIES:  No Known Allergies   PHYSICAL EXAM:  ECOG Performance status: 3 - Requires considerable assistance    Vitals:   01/03/17 1057  BP: 117/66  Pulse: 79  Resp: 16  Temp: 97.8 F (36.6 C)   Filed Weights     Physical Exam  Constitutional: He is oriented to person, place, and time. No distress.  Thin, frail male in no distress   HENT:  Head: Normocephalic.  Eyes: Pupils are equal, round, and reactive to light. Conjunctivae are normal. No scleral icterus.  Neck: Normal range of motion. Neck supple.  Cardiovascular: Normal rate and regular rhythm.   Pacemaker in place   Pulmonary/Chest: Effort normal and breath sounds normal. No respiratory distress. He has no wheezes. He has no rales.  Abdominal: Soft. Bowel sounds are normal. There is no tenderness.  Musculoskeletal: He exhibits no edema.  Lymphadenopathy:    He has no cervical adenopathy.  Neurological: He is alert and oriented to person, place, and time.  Mild cognitive delay, particularly when answering questions   Skin: Skin is warm and dry. No rash noted.  Psychiatric: Mood and affect normal.      LABORATORY DATA:  I have reviewed the labs as listed.  CBC    Component Value Date/Time   WBC 2.7 (L) 01/03/2017 1018   RBC 4.02 (L) 01/03/2017 1018   HGB 12.3 (L) 01/03/2017 1018   HCT 38.8 (L) 01/03/2017 1018   PLT 128 (L) 01/03/2017 1018   MCV 96.5 01/03/2017 1018   MCH 30.6 01/03/2017 1018   MCHC 31.7 01/03/2017 1018   RDW 14.5 01/03/2017 1018   LYMPHSABS 0.5 (L) 01/03/2017 1018   MONOABS 0.5 01/03/2017 1018   EOSABS 0.2 01/03/2017 1018   BASOSABS 0.0 01/03/2017 1018   CMP Latest Ref Rng & Units 12/13/2016 12/12/2016 12/11/2016  Glucose 65 - 99 mg/dL 85 254(H) 233(H)  BUN 6 - 20 mg/dL 34(H) 47(H) 66(H)  Creatinine 0.61 - 1.24 mg/dL 1.40(H) 1.56(H) 2.11(H)  Sodium 135 - 145 mmol/L 139 146(H) 141  Potassium 3.5 - 5.1 mmol/L 3.4(L) 3.0(L) 3.4(L)  Chloride 101 - 111 mmol/L 110 117(H) 113(H)  CO2 22 - 32 mmol/L 23 24 21(L)  Calcium 8.9 - 10.3 mg/dL 8.0(L) 8.8(L) 8.3(L)  Total Protein 6.5 - 8.1 g/dL - - 5.9(L)  Total Bilirubin 0.3 - 1.2 mg/dL - - 0.7  Alkaline Phos 38 - 126 U/L - - 41  AST 15 - 41 U/L - - 36  ALT 17 - 63 U/L - - 54    PENDING LABS:    DIAGNOSTIC IMAGING:  Recent CT head: 06/26/16   RIGHT KNEE - COMPLETE 4+ VIEW 08/31/2016  IMPRESSION: No acute findings.  Extensive peripheral vascular calcification and old bone infarcts.  RENAL / URINARY TRACT ULTRASOUND COMPLETE 09/05/2016  PORTABLE CHEST 1 VIEW 09/05/2016  No active disease.    IMPRESSION: 1. No hydronephrosis. 2. Bilateral renal cysts.    PATHOLOGY:  Last EGD (Dr. Gala Romney): 10/12/15      ASSESSMENT & PLAN:   Anemia due to CKD, iron deficiency, and anemia of chronic disease:   - Hemoglobin has been improving. Hemoglobin 12.3 g/dL today.  - Since his anemia has been doing so well, I will change his  Aranesp injection to every 4 weeks. Proceed with aranesp injections  if hemoglobin is less than 11 g/dL.  RTC in 4 months for follow up.    All questions were answered to  patient's stated satisfaction. Encouraged patient to call with any new concerns or questions before his next visit to the cancer center and we can certain see him sooner, if needed.    Twana First, MD

## 2017-01-18 NOTE — Progress Notes (Addendum)
I was reviewing his record today and I do not see the progress note dictated on the telephone system earlier this month. This is documentation of my visit.  Subjective: He is sitting in a wheelchair and in no acute distress. He has no new complaints. He had problems with thrush and he says that his mouth is better.  Objective: Constitutional: He is awake and sitting in a wheelchair. Cardiovascular: His heart is regular with a systolic heart murmur. Respiratory: His respiratory effort is normal and his lungs are clear. Gastrointestinal: His abdomen is soft. Skin: Warm and dry. Neurological: He's had a previous stroke and has some facial asymmetry  I reviewed medications in the [facility] and they are appropriate. He is finished with treatment for thrush.  Assessment: He has multiple medical problems including HIV/AIDS and he is on anti-viral treatment for that. He has chronic combined congestive heart failure which seems pretty stable. He has stage III chronic kidney disease which is stable. He has had some confusion. He has COPD at baseline and that seems pretty stable. He has significant nutritional issues and that is improving.  Plan: Continue treatments. Observe for opportunistic infections.

## 2017-01-31 ENCOUNTER — Other Ambulatory Visit (HOSPITAL_COMMUNITY): Payer: Medicare HMO

## 2017-01-31 ENCOUNTER — Encounter (HOSPITAL_COMMUNITY): Payer: Medicare HMO | Attending: Oncology

## 2017-01-31 DIAGNOSIS — N183 Chronic kidney disease, stage 3 (moderate): Secondary | ICD-10-CM | POA: Insufficient documentation

## 2017-01-31 DIAGNOSIS — D631 Anemia in chronic kidney disease: Secondary | ICD-10-CM | POA: Insufficient documentation

## 2017-01-31 DIAGNOSIS — D61818 Other pancytopenia: Secondary | ICD-10-CM | POA: Insufficient documentation

## 2017-02-14 ENCOUNTER — Non-Acute Institutional Stay (SKILLED_NURSING_FACILITY): Payer: Medicare HMO | Admitting: Internal Medicine

## 2017-02-14 ENCOUNTER — Encounter: Payer: Self-pay | Admitting: Internal Medicine

## 2017-02-14 ENCOUNTER — Other Ambulatory Visit (HOSPITAL_COMMUNITY)
Admission: RE | Admit: 2017-02-14 | Discharge: 2017-02-14 | Disposition: A | Discharge status: Home / Self Care | Payer: Medicare HMO | Source: Skilled Nursing Facility | Attending: Pulmonary Disease | Admitting: Pulmonary Disease

## 2017-02-14 DIAGNOSIS — I10 Essential (primary) hypertension: Secondary | ICD-10-CM | POA: Diagnosis present

## 2017-02-14 DIAGNOSIS — Z95810 Presence of automatic (implantable) cardiac defibrillator: Secondary | ICD-10-CM | POA: Insufficient documentation

## 2017-02-14 DIAGNOSIS — J449 Chronic obstructive pulmonary disease, unspecified: Secondary | ICD-10-CM | POA: Diagnosis present

## 2017-02-14 DIAGNOSIS — E785 Hyperlipidemia, unspecified: Secondary | ICD-10-CM | POA: Insufficient documentation

## 2017-02-14 DIAGNOSIS — B20 Human immunodeficiency virus [HIV] disease: Secondary | ICD-10-CM | POA: Diagnosis not present

## 2017-02-14 DIAGNOSIS — I130 Hypertensive heart and chronic kidney disease with heart failure and stage 1 through stage 4 chronic kidney disease, or unspecified chronic kidney disease: Secondary | ICD-10-CM | POA: Insufficient documentation

## 2017-02-14 DIAGNOSIS — E1122 Type 2 diabetes mellitus with diabetic chronic kidney disease: Secondary | ICD-10-CM | POA: Insufficient documentation

## 2017-02-14 DIAGNOSIS — I5042 Chronic combined systolic (congestive) and diastolic (congestive) heart failure: Secondary | ICD-10-CM | POA: Insufficient documentation

## 2017-02-14 DIAGNOSIS — N181 Chronic kidney disease, stage 1: Secondary | ICD-10-CM | POA: Insufficient documentation

## 2017-02-14 DIAGNOSIS — E119 Type 2 diabetes mellitus without complications: Secondary | ICD-10-CM

## 2017-02-14 DIAGNOSIS — R7881 Bacteremia: Secondary | ICD-10-CM | POA: Diagnosis present

## 2017-02-14 DIAGNOSIS — I480 Paroxysmal atrial fibrillation: Secondary | ICD-10-CM | POA: Diagnosis present

## 2017-02-14 DIAGNOSIS — Z8673 Personal history of transient ischemic attack (TIA), and cerebral infarction without residual deficits: Secondary | ICD-10-CM

## 2017-02-14 DIAGNOSIS — N3281 Overactive bladder: Secondary | ICD-10-CM | POA: Diagnosis present

## 2017-02-14 DIAGNOSIS — D649 Anemia, unspecified: Secondary | ICD-10-CM

## 2017-02-14 DIAGNOSIS — I5022 Chronic systolic (congestive) heart failure: Secondary | ICD-10-CM | POA: Diagnosis present

## 2017-02-14 DIAGNOSIS — I429 Cardiomyopathy, unspecified: Secondary | ICD-10-CM | POA: Insufficient documentation

## 2017-02-14 DIAGNOSIS — D631 Anemia in chronic kidney disease: Secondary | ICD-10-CM | POA: Insufficient documentation

## 2017-02-14 DIAGNOSIS — I639 Cerebral infarction, unspecified: Secondary | ICD-10-CM | POA: Diagnosis present

## 2017-02-14 LAB — RENAL FUNCTION PANEL
Albumin: 3.5 g/dL (ref 3.5–5.0)
Anion gap: 7 (ref 5–15)
BUN: 67 mg/dL — AB (ref 6–20)
CO2: 27 mmol/L (ref 22–32)
Calcium: 9.9 mg/dL (ref 8.9–10.3)
Chloride: 101 mmol/L (ref 101–111)
Creatinine, Ser: 2.08 mg/dL — ABNORMAL HIGH (ref 0.61–1.24)
GFR calc Af Amer: 37 mL/min — ABNORMAL LOW (ref >60)
GFR calc non Af Amer: 32 mL/min — ABNORMAL LOW (ref >60)
GLUCOSE: 131 mg/dL — AB (ref 65–99)
POTASSIUM: 4.3 mmol/L (ref 3.5–5.1)
Phosphorus: 3.3 mg/dL (ref 2.5–4.6)
Sodium: 135 mmol/L (ref 135–145)

## 2017-02-14 LAB — IRON AND TIBC
Iron: 57 ug/dL (ref 45–182)
SATURATION RATIOS: 19 % (ref 17.9–39.5)
TIBC: 300 ug/dL (ref 250–450)
UIBC: 243 ug/dL

## 2017-02-14 LAB — FERRITIN: Ferritin: 80 ng/mL (ref 24–336)

## 2017-02-14 NOTE — Progress Notes (Signed)
Provider:  Ander Purpura Location:   [facility] Room Number: 148/D Place of Service:  SNF (31)  PCP: Sinda Du, MD Patient Care Team: Sinda Du, MD as PCP - General (Pulmonary Disease) Gala Romney Cristopher Estimable, MD as Consulting Physician (Gastroenterology)  Extended Emergency Contact Information Primary Emergency Contact: [facility] Address: [address], [place] [address] [place] Phone: [phone] Relation: Relative Secondary Emergency Contact: [facility] Address: [address], [place] [address] [place] Phone: [phone] Relation: Sister  Code Status: DNR Goals of Care: Advanced Directive information Advanced Directives 02/14/2017  Does Patient Have a Medical Advance Directive? Yes  Type of Advance Directive Out of facility DNR (pink MOST or yellow form)  Does patient want to make changes to medical advance directive? No - Patient declined  Copy of [place] in Chart? -  Would patient like information on creating a medical advance directive? -  Pre-existing out of facility DNR order (yellow form or pink MOST form) -      Chief Complaint  Patient presents with  . New Admit To SNF    New Admission Visit, Due DM Foot and Eye Exam , Albumin    HPI: Patient is a 66 y.o. male seen today for admission . Patient is long term resident of facility but is now transferred to our Care.  He has h/o  Chronic CHF with Cardiomyopathy EF of 30% in 07/18, CKD Stage 3, HIV on treatment with Infectious disease. S/P AICD, hypertension, S/P CVA,  Chronic Anemia, Has Chronic Foley Cathter. Hyperlipidemia, Diabetes Mellitus, COPD  Patient is doing well in facility. There were no Nursing issues. His appetite is good and his weight is now 134 lbs from 132 lbs. Patient denies any new complains. He denies any Cough or SOB   Past Medical History:  Diagnosis Date    . AICD (automatic cardioverter/defibrillator) present   . Alcoholism in remission ([place])   . Anemia   . Aortic insufficiency   . Arthritis   . At moderate risk for fall   . Bell's palsy   . Cardiomyopathy ([place])   . Chronic systolic heart failure (HCC)    NYHA Class III  . CKD (chronic kidney disease) stage 3, GFR 30-59 ml/min   . COPD (chronic obstructive pulmonary disease) ([place])   . Essential hypertension, benign   . HIV disease ([place]) 09/06/2016  . Hyperlipidemia   . Noncompliance with medication regimen   . Nonischemic cardiomyopathy (HCC)    EF 15-20%  . NSVT (nonsustained ventricular tachycardia) ([place])   . Numbness of right jaw    Had a stoke in 01/2013. Numbness is occasional, especially when trying to chew.  . Productive cough 08/2013   With brown sputum   . SOB (shortness of breath) on exertion 08/2013  . Stroke ([place]) 01/2013   weakness of right side from CVA  . Type 2 diabetes mellitus ([place])   . Uses hearing aid 2014   recently received new hearing aids   Past Surgical History:  Procedure Laterality Date  . BIOPSY N/A 04/10/2014   Procedure: GASTRIC BIOPSY;  Surgeon: Daneil Dolin, MD;  Location: [facility] ORS;  Service: Endoscopy;  Laterality: N/A;  . BIOPSY  10/12/2015   Procedure: BIOPSY;  Surgeon: Daneil Dolin, MD;  Location: [facility];  Service: Endoscopy;;  stomach bx's  .  CARDIAC DEFIBRILLATOR PLACEMENT  11/12/12   [employer] Inogen MINI ICD implanted in [place] at [place] [facility] per Dr Nelly Laurence note  . COLONOSCOPY WITH PROPOFOL N/A 04/10/2014   RMR: Colonic Diverticulosis  . ESOPHAGOGASTRODUODENOSCOPY (EGD) WITH PROPOFOL N/A 04/10/2014   RMR: Mild erosive reflux esophagitis. Multiple antral polyps likely hyperplastic status post removal by hot snare cautery technique. Diffusely abnormal stomach status post gastric biopsy I suspect some of patients anemaia may be due to intermittent oozing from the stomach.  It would be difficult and a risky proposition to attempt complete removal of all of his gastric polyps.  . ESOPHAGOGASTRODUODENOSCOPY (EGD) WITH PROPOFOL N/A 10/12/2015   Procedure: ESOPHAGOGASTRODUODENOSCOPY (EGD) WITH PROPOFOL;  Surgeon: Daneil Dolin, MD;  [facility];  Service: Endoscopy;  Laterality: N/A;  5681 - moved to 9:15  . POLYPECTOMY N/A 04/10/2014   Procedure: GASTRIC POLYPECTOMY;  Surgeon: Daneil Dolin, MD;  Location: [facility];  Service: Endoscopy;  Laterality: N/A;  . RIGHT HEART CATHETERIZATION N/A 02/06/2014   Procedure: RIGHT HEART CATH;  Surgeon: Larey Dresser, MD;  Location: [facility] CATH LAB;  Service: Cardiovascular;  Laterality: N/A;    reports that he quit smoking about 23 years ago. His smoking use included Cigarettes. He has never used smokeless tobacco. He reports that he drinks alcohol. He reports that he does not use drugs. Social History   Social History  . Marital status: Single    Spouse name: N/A  . Number of children: N/A  . Years of education: N/A   Occupational History  . disabled    Social History Main Topics  . Smoking status: Former Smoker    Types: Cigarettes    Quit date: 11/05/1993  . Smokeless tobacco: Never Used  . Alcohol use Yes     Comment: former heavy ETOH, none recently  . Drug use: No     Comment: prior history of cocaine use, smoking   . Sexual activity: Yes    Birth control/ protection: None   Other Topics Concern  . Not on file   Social History Narrative   Recently moved from [place] to be with family in [place] (2015).  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  Has h/o polysubstance abuse.  Baseline - able to ambulate, mild cognitive delay, able to toilet and shower himself, occasional use of walker/cane.                Functional Status Survey:    Family History  Problem Relation Age of Onset  . Kidney disease Mother   . Kidney disease Sister   . Colon cancer Neg Hx     Health  Maintenance  Topic Date Due  . FOOT EXAM  03/16/2017 (Originally 02/10/1961)  . OPHTHALMOLOGY EXAM  03/16/2017 (Originally 02/10/1961)  . URINE MICROALBUMIN  03/16/2017 (Originally 02/10/1961)  . TETANUS/TDAP  04/29/2017 (Originally 02/11/1970)  . HEMOGLOBIN A1C  03/07/2017  . PNA vac Low Risk Adult (1 of 2 - PCV13) 09/19/2017  . COLONOSCOPY  04/10/2024  . Hepatitis C Screening  Completed  . HIV Screening  Completed    No Known Allergies  Outpatient Encounter Prescriptions as of 02/14/2017  Medication Sig  . acetaminophen (TYLENOL) 325 MG tablet Take 2 tablets (650 mg total) by mouth every 4 (four) hours as needed for mild pain (or temp > 37.5 C (99.5 F)).  Marland Kitchen aspirin 325 MG tablet Take 1  tablet (325 mg total) by mouth daily.  Marland Kitchen atorvastatin (LIPITOR) 80 MG tablet Take 80 mg by mouth daily.   Marland Kitchen atovaquone (MEPRON) 750 MG/5ML suspension Take 10 mLs (1,500 mg total) by mouth daily with breakfast.  . carvedilol (COREG) 3.125 MG tablet Take 1 tablet (3.125 mg total) by mouth 2 (two) times daily with a meal.  . Cholecalciferol (VITAMIN D3) 2000 UNITS TABS Take 2,000 mg by mouth daily.  . digoxin (LANOXIN) 0.125 MG tablet TAKE 1/2 TABLET BY MOUTH DAILY  . dolutegravir (TIVICAY) 50 MG tablet Take 1 tablet (50 mg total) by mouth daily.  Marland Kitchen emtricitabine-tenofovir AF (DESCOVY) 200-25 MG tablet Take 1 tablet by mouth daily.  . feeding supplement, ENSURE ENLIVE, (ENSURE ENLIVE) LIQD Take 237 mLs by mouth 2 (two) times daily between meals.  . ferrous sulfate 325 (65 FE) MG tablet Take 325 mg by mouth daily with breakfast.  . isosorbide dinitrate (ISORDIL) 10 MG tablet Take 10 mg by mouth 3 (three) times daily.   . mirabegron ER (MYRBETRIQ) 25 MG TB24 tablet Take 25 mg by mouth daily.  . Multiple Vitamin (MULTIVITAMIN WITH MINERALS) TABS tablet Take 1 tablet by mouth daily.  . pantoprazole (PROTONIX) 40 MG tablet TAKE ONE TABLET   BY MOUTH   DAILY  . [DISCONTINUED] amoxicillin (AMOXIL) 500 MG capsule  Take 1 capsule (500 mg total) by mouth 3 (three) times daily.   No facility-administered encounter medications on file as of 02/14/2017.      Review of Systems  Review of Systems  Constitutional: Negative for activity change, appetite change, chills, diaphoresis, fatigue and fever.  HENT: Negative for mouth sores, postnasal drip, rhinorrhea, sinus pain and sore throat.   Respiratory: Negative for apnea, cough, chest tightness, shortness of breath and wheezing.   Cardiovascular: Negative for chest pain, palpitations and leg swelling.  Gastrointestinal: Negative for abdominal distention, abdominal pain, constipation, diarrhea, nausea and vomiting.  Genitourinary: Negative for dysuria and frequency.  Musculoskeletal: Negative for arthralgias, joint swelling and myalgias.  Skin: Negative for rash.  Neurological: Negative for dizziness, syncope, weakness, light-headedness and numbness.  Psychiatric/Behavioral: Negative for behavioral problems, confusion and sleep disturbance.     Vitals:   02/14/17 1245  BP: 130/66  Pulse: 77  Resp: 17  Temp: 98.3 F (36.8 C)   There is no height or weight on file to calculate BMI. Physical Exam  Constitutional: He appears well-developed.  HENT:  Head: Normocephalic.  Mouth/Throat: Oropharynx is clear and moist.  Eyes: Pupils are equal, round, and reactive to light.  Neck: Neck supple.  Cardiovascular: Normal rate.   Systolic Murmur present  Pulmonary/Chest: Effort normal and breath sounds normal. No respiratory distress. He has no wheezes. He has no rales.  Abdominal: Soft. Bowel sounds are normal. He exhibits no distension. There is no tenderness. There is no rebound.  Musculoskeletal: He exhibits no edema.  Neurological: He is alert.  Did know the date but not the place. He did not have any focal deficits. Will follow all commands  Skin: Skin is warm and dry. No rash noted. No erythema.  Psychiatric: He has a normal mood and affect. His  behavior is normal. Thought content normal.    Labs reviewed: Basic Metabolic Panel:  Recent Labs  09/09/16 0626  10/17/16 0700 10/18/16 0718  12/12/16 0651 12/13/16 0433 02/14/17 0800  NA 142  < > 134* 132*  < > 146* 139 135  K 3.4*  < > 4.8 5.5*  < > 3.0* 3.4*  4.3  CL 112*  < > 100* 99*  < > 117* 110 101  CO2 26  < > 29 26  < > 24 23 27   GLUCOSE 113*  < > 128* 124*  < > 254* 85 131*  BUN 92*  < > 59* 62*  < > 47* 34* 67*  CREATININE 2.94*  < > 1.57* 1.54*  < > 1.56* 1.40* 2.08*  CALCIUM 7.6*  < > 9.6 9.6  < > 8.8* 8.0* 9.9  MG 1.6*  --   --   --   --   --   --   --   PHOS 2.8  < > 3.4 4.3  --   --   --  3.3  < > = values in this interval not displayed. Liver Function Tests:  Recent Labs  12/09/16 1058 12/10/16 0437 12/11/16 0421 02/14/17 0800  AST 46* 56* 36  --   ALT 67* 55 54  --   ALKPHOS 75 55 41  --   BILITOT 0.6 0.6 0.7  --   PROT 7.3 6.4* 5.9*  --   ALBUMIN 2.8* 2.3* 2.2* 3.5   No results for input(s): LIPASE, AMYLASE in the last 8760 hours. No results for input(s): AMMONIA in the last 8760 hours. CBC:  Recent Labs  12/11/16 0421 12/20/16 0947 01/03/17 1018  WBC 7.0 2.9* 2.7*  NEUTROABS 6.5 1.7 1.5*  HGB 8.8* 10.6* 12.3*  HCT 27.9* 32.8* 38.8*  MCV 93.6 92.4 96.5  PLT 91* 164 128*   Cardiac Enzymes:  Recent Labs  06/26/16 2300 06/27/16 0655 06/27/16 1325  TROPONINI 0.36* 0.31* 0.36*   BNP: Invalid input(s): POCBNP Lab Results  Component Value Date   HGBA1C 6.1 (H) 09/05/2016   Lab Results  Component Value Date   TSH 0.003 (L) 06/26/2016   Lab Results  Component Value Date   VITAMINB12 503 08/01/2016   Lab Results  Component Value Date   FOLATE 19.0 09/07/2016   Lab Results  Component Value Date   IRON 36 (L) 10/18/2016   TIBC 256 10/18/2016   FERRITIN 301 10/18/2016    Imaging and Procedures obtained prior to SNF admission: No results found.  Assessment/Plan  Chronic combined systolic and diastolic CHF with  Cardiomyopathy Patient doing well. Not on any Diuretic. Stable on Digoxin, Nitrate, Coreg. He is also s/p AICD placement . Will continue to follow Weights. Check Dig level as he has worsening of renal Function  Essential hypertension, BP Controlled  Diabetes mellitus type 2  BS are less then 200 mostly with few spikes of 250 Will check A1C Was 6.1 in 04/18  HIV disease  Follows with Infectious disease. Per their Notes his Viral Load is suppressed He has follow appointment in 11/18 He continues on Atovaquone and Descovy  History of stroke Doing well with supportive care. On aspirin  Anemia due to renal disease and chronic infection Follow with Cancer center for Aranesp injections.  Chronic i renal Disease Some worsening of renal function Has f/u with Nephrology Chronic Foley cathter Per nurses he has h/o retention Do not see if he has followed with Urology We are trying to see if he can get appointment with Urology  Low TSH in 01/18 Repeat TSH with T3 and T4 Hyperlipidemia On Lipitor Check Fasting Lipid.  Family/ staff Communication:   Labs/tests ordered:  Total time spent in this patient care encounter was 45_ minutes; greater than 50% of the visit spent counseling patient, reviewing records ,  Labs and coordinating care for problems addressed at this encounter.

## 2017-02-15 ENCOUNTER — Encounter (HOSPITAL_COMMUNITY)
Admission: RE | Admit: 2017-02-15 | Discharge: 2017-02-15 | Disposition: A | Discharge status: Home / Self Care | Payer: Medicare HMO | Source: Skilled Nursing Facility | Attending: Internal Medicine | Admitting: Internal Medicine

## 2017-02-15 DIAGNOSIS — I5022 Chronic systolic (congestive) heart failure: Secondary | ICD-10-CM | POA: Diagnosis present

## 2017-02-15 DIAGNOSIS — R1312 Dysphagia, oropharyngeal phase: Secondary | ICD-10-CM | POA: Insufficient documentation

## 2017-02-15 DIAGNOSIS — I480 Paroxysmal atrial fibrillation: Secondary | ICD-10-CM | POA: Insufficient documentation

## 2017-02-15 DIAGNOSIS — I639 Cerebral infarction, unspecified: Secondary | ICD-10-CM | POA: Insufficient documentation

## 2017-02-15 DIAGNOSIS — I110 Hypertensive heart disease with heart failure: Secondary | ICD-10-CM | POA: Insufficient documentation

## 2017-02-15 DIAGNOSIS — Z9181 History of falling: Secondary | ICD-10-CM | POA: Diagnosis not present

## 2017-02-15 DIAGNOSIS — N3281 Overactive bladder: Secondary | ICD-10-CM | POA: Insufficient documentation

## 2017-02-15 LAB — PTH, INTACT AND CALCIUM
Calcium, Total (PTH): 10.3 mg/dL — ABNORMAL HIGH (ref 8.6–10.2)
PTH: 27 pg/mL (ref 15–65)

## 2017-02-15 LAB — LIPID PANEL
CHOL/HDL RATIO: 2.9 ratio
Cholesterol: 82 mg/dL (ref 0–200)
HDL: 28 mg/dL — ABNORMAL LOW (ref >40)
LDL CALC: 42 mg/dL (ref 0–99)
Triglycerides: 61 mg/dL (ref <150)
VLDL: 12 mg/dL (ref 0–40)

## 2017-02-15 LAB — PROTEIN / CREATININE RATIO, URINE
Creatinine, Urine: 84.63 mg/dL
Protein Creatinine Ratio: 1 mg/mg{Cre} — ABNORMAL HIGH (ref 0.00–0.15)
Total Protein, Urine: 85 mg/dL

## 2017-02-15 LAB — VITAMIN D 25 HYDROXY (VIT D DEFICIENCY, FRACTURES): VIT D 25 HYDROXY: 57.5 ng/mL (ref 30.0–100.0)

## 2017-02-15 LAB — T4, FREE: Free T4: 0.95 ng/dL (ref 0.61–1.12)

## 2017-02-15 LAB — HEMOGLOBIN A1C
HEMOGLOBIN A1C: 7.4 % — AB (ref 4.8–5.6)
Mean Plasma Glucose: 165.68 mg/dL

## 2017-02-15 LAB — TSH: TSH: 3.661 u[IU]/mL (ref 0.350–4.500)

## 2017-02-16 LAB — T3: T3 TOTAL: 89 ng/dL (ref 71–180)

## 2017-02-21 ENCOUNTER — Other Ambulatory Visit (HOSPITAL_COMMUNITY)
Admission: AD | Admit: 2017-02-21 | Discharge: 2017-02-21 | Disposition: A | Discharge status: Home / Self Care | Payer: Medicare HMO | Source: Skilled Nursing Facility | Attending: Internal Medicine | Admitting: Internal Medicine

## 2017-02-21 DIAGNOSIS — N189 Chronic kidney disease, unspecified: Secondary | ICD-10-CM | POA: Diagnosis present

## 2017-02-21 LAB — PROTEIN / CREATININE RATIO, URINE
Creatinine, Urine: 111.51 mg/dL
Protein Creatinine Ratio: 0.77 mg/mg{Cre} — ABNORMAL HIGH (ref 0.00–0.15)
Total Protein, Urine: 86 mg/dL

## 2017-02-22 ENCOUNTER — Encounter (HOSPITAL_COMMUNITY)
Admission: RE | Admit: 2017-02-22 | Discharge: 2017-02-22 | Disposition: A | Discharge status: Home / Self Care | Payer: Medicare HMO | Source: Skilled Nursing Facility | Attending: *Deleted | Admitting: *Deleted

## 2017-02-22 DIAGNOSIS — I110 Hypertensive heart disease with heart failure: Secondary | ICD-10-CM | POA: Diagnosis not present

## 2017-02-22 LAB — CBC
HEMATOCRIT: 38.7 % — AB (ref 39.0–52.0)
HEMOGLOBIN: 12.5 g/dL — AB (ref 13.0–17.0)
MCH: 30 pg (ref 26.0–34.0)
MCHC: 32.3 g/dL (ref 30.0–36.0)
MCV: 93 fL (ref 78.0–100.0)
PLATELETS: 89 10*3/uL — AB (ref 150–400)
RBC: 4.16 MIL/uL — AB (ref 4.22–5.81)
RDW: 13.4 % (ref 11.5–15.5)
WBC: 2.6 10*3/uL — AB (ref 4.0–10.5)

## 2017-02-22 LAB — COMPREHENSIVE METABOLIC PANEL
ALBUMIN: 3.5 g/dL (ref 3.5–5.0)
ALK PHOS: 83 U/L (ref 38–126)
ALT: 57 U/L (ref 17–63)
ANION GAP: 11 (ref 5–15)
AST: 49 U/L — AB (ref 15–41)
BILIRUBIN TOTAL: 0.8 mg/dL (ref 0.3–1.2)
BUN: 67 mg/dL — AB (ref 6–20)
CALCIUM: 9.6 mg/dL (ref 8.9–10.3)
CO2: 26 mmol/L (ref 22–32)
Chloride: 98 mmol/L — ABNORMAL LOW (ref 101–111)
Creatinine, Ser: 2.32 mg/dL — ABNORMAL HIGH (ref 0.61–1.24)
GFR calc Af Amer: 32 mL/min — ABNORMAL LOW (ref >60)
GFR, EST NON AFRICAN AMERICAN: 28 mL/min — AB (ref >60)
GLUCOSE: 208 mg/dL — AB (ref 65–99)
Potassium: 4.8 mmol/L (ref 3.5–5.1)
Sodium: 135 mmol/L (ref 135–145)
TOTAL PROTEIN: 8 g/dL (ref 6.5–8.1)

## 2017-02-22 LAB — PHOSPHORUS: PHOSPHORUS: 3.2 mg/dL (ref 2.5–4.6)

## 2017-02-23 ENCOUNTER — Non-Acute Institutional Stay (SKILLED_NURSING_FACILITY): Payer: Medicare HMO

## 2017-02-23 DIAGNOSIS — Z0000 Encounter for general adult medical examination without abnormal findings: Secondary | ICD-10-CM

## 2017-02-23 LAB — PARATHYROID HORMONE, INTACT (NO CA): PTH: 32 pg/mL (ref 15–65)

## 2017-02-23 LAB — VITAMIN D 25 HYDROXY (VIT D DEFICIENCY, FRACTURES): Vit D, 25-Hydroxy: 53 ng/mL (ref 30.0–100.0)

## 2017-02-23 NOTE — Progress Notes (Signed)
Subjective:   Keith Young is a 66 y.o. male who presents for an Initial Medicare Annual Wellness Visit at [facility]   Objective:    Today's Vitals   02/23/17 1117  BP: 118/60  Pulse: 87  Temp: 97.7 F (36.5 C)  TempSrc: Oral  SpO2: 98%  Weight: 117 lb (53.1 kg)  Height: 5\' 4"  (1.626 m)   Body mass index is 20.08 kg/m.  Current Medications (verified) Outpatient Encounter Prescriptions as of 02/23/2017  Medication Sig  . acetaminophen (TYLENOL) 325 MG tablet Take 2 tablets (650 mg total) by mouth every 4 (four) hours as needed for mild pain (or temp > 37.5 C (99.5 F)).  Marland Kitchen aspirin 325 MG tablet Take 1 tablet (325 mg total) by mouth daily.  Marland Kitchen atorvastatin (LIPITOR) 80 MG tablet Take 80 mg by mouth daily.   Marland Kitchen atovaquone (MEPRON) 750 MG/5ML suspension Take 10 mLs (1,500 mg total) by mouth daily with breakfast.  . carvedilol (COREG) 3.125 MG tablet Take 1 tablet (3.125 mg total) by mouth 2 (two) times daily with a meal.  . Cholecalciferol (VITAMIN D3) 2000 UNITS TABS Take 2,000 mg by mouth daily.  . digoxin (LANOXIN) 0.125 MG tablet TAKE 1/2 TABLET BY MOUTH DAILY  . dolutegravir (TIVICAY) 50 MG tablet Take 1 tablet (50 mg total) by mouth daily.  Marland Kitchen emtricitabine-tenofovir AF (DESCOVY) 200-25 MG tablet Take 1 tablet by mouth daily.  . feeding supplement, ENSURE ENLIVE, (ENSURE ENLIVE) LIQD Take 237 mLs by mouth 2 (two) times daily between meals.  . ferrous sulfate 325 (65 FE) MG tablet Take 325 mg by mouth daily with breakfast.  . isosorbide dinitrate (ISORDIL) 10 MG tablet Take 10 mg by mouth 3 (three) times daily.   . mirabegron ER (MYRBETRIQ) 25 MG TB24 tablet Take 25 mg by mouth daily.  . Multiple Vitamin (MULTIVITAMIN WITH MINERALS) TABS tablet Take 1 tablet by mouth daily.  . pantoprazole (PROTONIX) 40 MG tablet TAKE ONE TABLET   BY MOUTH   DAILY   No facility-administered encounter medications on file as of 02/23/2017.     Allergies  (verified) Patient has no known allergies.   History: Past Medical History:  Diagnosis Date  . AICD (automatic cardioverter/defibrillator) present   . Alcoholism in remission ([place])   . Anemia   . Aortic insufficiency   . Arthritis   . At moderate risk for fall   . Bell's palsy   . Cardiomyopathy ([place])   . Chronic systolic heart failure (HCC)    NYHA Class III  . CKD (chronic kidney disease) stage 3, GFR 30-59 ml/min   . COPD (chronic obstructive pulmonary disease) ([place])   . Essential hypertension, benign   . HIV disease ([place]) 09/06/2016  . Hyperlipidemia   . Noncompliance with medication regimen   . Nonischemic cardiomyopathy (HCC)    EF 15-20%  . NSVT (nonsustained ventricular tachycardia) ([place])   . Numbness of right jaw    Had a stoke in 01/2013. Numbness is occasional, especially when trying to chew.  . Productive cough 08/2013   With brown sputum   . SOB (shortness of breath) on exertion 08/2013  . Stroke ([place]) 01/2013   weakness of right side from CVA  . Type 2 diabetes mellitus ([place])   . Uses hearing aid 2014   recently received new hearing aids   Past Surgical History:  Procedure Laterality Date  . BIOPSY N/A 04/10/2014   Procedure: GASTRIC BIOPSY;  Surgeon: Herbie Baltimore  Hilton Cork, MD;  Location: [facility] ORS;  Service: Endoscopy;  Laterality: N/A;  . BIOPSY  10/12/2015   Procedure: BIOPSY;  Surgeon: Daneil Dolin, MD;  Location: [facility] END[facility] SUITE;  Service: Endoscopy;;  stomach bx's  . CARDIAC DEFIBRILLATOR PLACEMENT  11/12/12   [employer] Inogen MINI ICD implanted in [place] at [facility] per Dr Nelly Laurence note  . COLONOSCOPY WITH PROPOFOL N/A 04/10/2014   RMR: Colonic Diverticulosis  . ESOPHAGOGASTRODUODENOSCOPY (EGD) WITH PROPOFOL N/A 04/10/2014   RMR: Mild erosive reflux esophagitis. Multiple antral polyps likely hyperplastic status post removal by hot snare cautery technique. Diffusely abnormal stomach status post  gastric biopsy I suspect some of patients anemaia may be due to intermittent oozing from the stomach. It would be difficult and a risky proposition to attempt complete removal of all of his gastric polyps.  . ESOPHAGOGASTRODUODENOSCOPY (EGD) WITH PROPOFOL N/A 10/12/2015   Procedure: ESOPHAGOGASTRODUODENOSCOPY (EGD) WITH PROPOFOL;  Surgeon: Daneil Dolin, MD;  Location: [facility];  Service: Endoscopy;  Laterality: N/A;  7124 - moved to 9:15  . POLYPECTOMY N/A 04/10/2014   Procedure: GASTRIC POLYPECTOMY;  Surgeon: Daneil Dolin, MD;  Location: [facility];  Service: Endoscopy;  Laterality: N/A;  . RIGHT HEART CATHETERIZATION N/A 02/06/2014   Procedure: RIGHT HEART CATH;  Surgeon: Larey Dresser, MD;  Location: [facility] C[facility];  Service: Cardiovascular;  Laterality: N/A;   Family History  Problem Relation Age of Onset  . Kidney disease Mother   . Kidney disease Sister   . Colon cancer Neg Hx    Social History   Occupational History  . disabled    Social History Main Topics  . Smoking status: Former Smoker    Types: Cigarettes    Quit date: 11/05/1993  . Smokeless tobacco: Never Used  . Alcohol use Yes     Comment: former heavy ETOH, none recently  . Drug use: No     Comment: prior history of cocaine use, smoking   . Sexual activity: Yes    Birth control/ protection: None   Tobacco Counseling Counseling given: Not Answered   Activities of Daily Living In your present state of health, do you have any difficulty performing the following activities: 02/23/2017 12/09/2016  Hearing? Y N  Vision? N N  Difficulty concentrating or making decisions? N N  Walking or climbing stairs? Y Y  Dressing or bathing? Y Y  Doing errands, shopping? Tempie Donning  Preparing Food and eating ? Y -  Using the Toilet? Y -  In the past six months, have you accidently leaked urine? Y -  Do you have problems with loss of bowel control? Y -  Managing your Medications? Y -  Managing your Finances? Y -  Housekeeping or  managing your Housekeeping? Y -  Some recent data might be hidden    Immunizations and Health Maintenance Immunization History  Administered Date(s) Administered  . Hepatitis A, Adult 09/19/2016  . Hepatitis B, adult 09/19/2016  . Influenza-Unspecified 03/13/2015  . Pneumococcal Polysaccharide-23 09/19/2016   There are no preventive care reminders to display for this patient.  Patient Care Team: Sinda Du, MD as PCP - General (Pulmonary Disease) Gala Romney Cristopher Estimable, MD as Consulting Physician (Gastroenterology)  Indicate any recent Medical Services you may have received from other than [facility] providers in the past year (date may be approximate).    Assessment:   This is a routine wellness examination for [place].  Hearing/Vision screen No exam data present  Dietary issues and exercise activities discussed: Current Exercise Habits: The patient does not participate in regular exercise at present, Exercise limited by: orthopedic condition(s)  Goals    None     Depression Screen PHQ 2/9 Scores 02/23/2017 11/29/2016 11/29/2016  PHQ - 2 Score 0 0 0    Fall Risk Fall Risk  02/23/2017 11/29/2016 11/29/2016  Falls in the past year? No No No    Cognitive Function:     6CIT Screen 02/23/2017  What Year? 0 points  What month? 0 points  What time? 0 points  Count back from 20 0 points  Months in reverse 4 points  Repeat phrase 10 points  Total Score 14    Screening Tests Health Maintenance  Topic Date Due  . FOOT EXAM  03/16/2017 (Originally 02/10/1961)  . OPHTHALMOLOGY EXAM  03/16/2017 (Originally 02/10/1961)  . URINE MICROALBUMIN  03/16/2017 (Originally 02/10/1961)  . TETANUS/TDAP  04/29/2017 (Originally 02/11/1970)  . HEMOGLOBIN A1C  08/15/2017  . PNA vac Low Risk Adult (1 of 2 - PCV13) 09/19/2017  . COLONOSCOPY  04/10/2024  . Hepatitis C Screening  Completed  . HIV Screening  Completed        Plan:    I have personally reviewed and addressed the Medicare Annual  Wellness questionnaire and have noted the following in the patient's chart:  A. Medical and social history B. Use of alcohol, tobacco or illicit drugs  C. Current medications and supplements D. Functional ability and status E.  Nutritional status F.  Physical activity G. Advance directives H. List of other physicians I.  Hospitalizations, surgeries, and ER visits in previous 12 months J.  [place] to include hearing, vision, cognitive, depression L. Referrals and appointments - none  In addition, I have reviewed and discussed with patient certain preventive protocols, quality metrics, and best practice recommendations. A written personalized care plan for preventive services as well as general preventive health recommendations were provided to patient.  See attached scanned questionnaire for additional information.   Signed,   Rich Reining, RN Nurse Health Advisor   Quick Notes   Health Maintenance: TDAP due     Abnormal Screen: 6 CIT-14     Patient Concerns: None     Nurse Concerns: None

## 2017-02-23 NOTE — Patient Instructions (Signed)
Keith Young , Thank you for taking time to come for your Medicare Wellness Visit. I appreciate your ongoing commitment to your health goals. Please review the following plan we discussed and let me know if I can assist you in the future.   Screening recommendations/referrals: Colonoscopy up to date. Due 04/10/24 Recommended yearly ophthalmology/optometry visit for glaucoma screening and checkup Recommended yearly dental visit for hygiene and checkup  Vaccinations: Influenza vaccine due Pneumococcal vaccine up to date. Pna 13 due 09/19/2017 Tdap vaccine due Shingles vaccine  Not in records    Advanced directives: Need a copy for chart  Conditions/risks identified: none  Next appointment: Dr. Lyndel Safe makes rounds  Preventive Care 34 Years and Older, Male Preventive care refers to lifestyle choices and visits with your health care provider that can promote health and wellness. What does preventive care include?  A yearly physical exam. This is also called an annual well check.  Dental exams once or twice a year.  Routine eye exams. Ask your health care provider how often you should have your eyes checked.  Personal lifestyle choices, including:  Daily care of your teeth and gums.  Regular physical activity.  Eating a healthy diet.  Avoiding tobacco and drug use.  Limiting alcohol use.  Practicing safe sex.  Taking low doses of aspirin every day.  Taking vitamin and mineral supplements as recommended by your health care provider. What happens during an annual well check? The services and screenings done by your health care provider during your annual well check will depend on your age, overall health, lifestyle risk factors, and family history of disease. Counseling  Your health care provider may ask you questions about your:  Alcohol use.  Tobacco use.  Drug use.  Emotional well-being.  Home and relationship well-being.  Sexual activity.  Eating  habits.  History of falls.  Memory and ability to understand (cognition).  Work and work Statistician. Screening  You may have the following tests or measurements:  Height, weight, and BMI.  Blood pressure.  Lipid and cholesterol levels. These may be checked every 5 years, or more frequently if you are over 76 years old.  Skin check.  Lung cancer screening. You may have this screening every year starting at age 21 if you have a 30-pack-year history of smoking and currently smoke or have quit within the past 15 years.  Fecal occult blood test (FOBT) of the stool. You may have this test every year starting at age 79.  Flexible sigmoidoscopy or colonoscopy. You may have a sigmoidoscopy every 5 years or a colonoscopy every 10 years starting at age 87.  Prostate cancer screening. Recommendations will vary depending on your family history and other risks.  Hepatitis C blood test.  Hepatitis B blood test.  Sexually transmitted disease (STD) testing.  Diabetes screening. This is done by checking your blood sugar (glucose) after you have not eaten for a while (fasting). You may have this done every 1-3 years.  Abdominal aortic aneurysm (AAA) screening. You may need this if you are a current or former smoker.  Osteoporosis. You may be screened starting at age 4 if you are at high risk. Talk with your health care provider about your test results, treatment options, and if necessary, the need for more tests. Vaccines  Your health care provider may recommend certain vaccines, such as:  Influenza vaccine. This is recommended every year.  Tetanus, diphtheria, and acellular pertussis (Tdap, Td) vaccine. You may need a Td booster every  10 years.  Zoster vaccine. You may need this after age 21.  Pneumococcal 13-valent conjugate (PCV13) vaccine. One dose is recommended after age 65.  Pneumococcal polysaccharide (PPSV23) vaccine. One dose is recommended after age 47. Talk to your health  care provider about which screenings and vaccines you need and how often you need them. This information is not intended to replace advice given to you by your health care provider. Make sure you discuss any questions you have with your health care provider. Document Released: 06/12/2015 Document Revised: 02/03/2016 Document Reviewed: 03/17/2015 Elsevier Interactive Patient Education  2017 [place] Prevention in the Home Falls can cause injuries. They can happen to people of all ages. There are many things you can do to make your home safe and to help prevent falls. What can I do on the outside of my home?  Regularly fix the edges of walkways and driveways and fix any cracks.  Remove anything that might make you trip as you walk through a door, such as a raised step or threshold.  Trim any bushes or trees on the path to your home.  Use bright outdoor lighting.  Clear any walking paths of anything that might make someone trip, such as rocks or tools.  Regularly check to see if handrails are loose or broken. Make sure that both sides of any steps have handrails.  Any raised decks and porches should have guardrails on the edges.  Have any leaves, snow, or ice cleared regularly.  Use sand or salt on walking paths during winter.  Clean up any spills in your garage right away. This includes oil or grease spills. What can I do in the bathroom?  Use night lights.  Install grab bars by the toilet and in the tub and shower. Do not use towel bars as grab bars.  Use non-skid mats or decals in the tub or shower.  If you need to sit down in the shower, use a plastic, non-slip stool.  Keep the floor dry. Clean up any water that spills on the floor as soon as it happens.  Remove soap buildup in the tub or shower regularly.  Attach bath mats securely with double-sided non-slip rug tape.  Do not have throw rugs and other things on the floor that can make you trip. What can I do  in the bedroom?  Use night lights.  Make sure that you have a light by your bed that is easy to reach.  Do not use any sheets or blankets that are too big for your bed. They should not hang down onto the floor.  Have a firm chair that has side arms. You can use this for support while you get dressed.  Do not have throw rugs and other things on the floor that can make you trip. What can I do in the kitchen?  Clean up any spills right away.  Avoid walking on wet floors.  Keep items that you use a lot in easy-to-reach places.  If you need to reach something above you, use a strong step stool that has a grab bar.  Keep electrical cords out of the way.  Do not use floor polish or wax that makes floors slippery. If you must use wax, use non-skid floor wax.  Do not have throw rugs and other things on the floor that can make you trip. What can I do with my stairs?  Do not leave any items on the stairs.  Make sure  that there are handrails on both sides of the stairs and use them. Fix handrails that are broken or loose. Make sure that handrails are as long as the stairways.  Check any carpeting to make sure that it is firmly attached to the stairs. Fix any carpet that is loose or worn.  Avoid having throw rugs at the top or bottom of the stairs. If you do have throw rugs, attach them to the floor with carpet tape.  Make sure that you have a light switch at the top of the stairs and the bottom of the stairs. If you do not have them, ask someone to add them for you. What else can I do to help prevent falls?  Wear shoes that:  Do not have high heels.  Have rubber bottoms.  Are comfortable and fit you well.  Are closed at the toe. Do not wear sandals.  If you use a stepladder:  Make sure that it is fully opened. Do not climb a closed stepladder.  Make sure that both sides of the stepladder are locked into place.  Ask someone to hold it for you, if possible.  Clearly mark  and make sure that you can see:  Any grab bars or handrails.  First and last steps.  Where the edge of each step is.  Use tools that help you move around (mobility aids) if they are needed. These include:  Canes.  Walkers.  Scooters.  Crutches.  Turn on the lights when you go into a dark area. Replace any light bulbs as soon as they burn out.  Set up your furniture so you have a clear path. Avoid moving your furniture around.  If any of your floors are uneven, fix them.  If there are any pets around you, be aware of where they are.  Review your medicines with your doctor. Some medicines can make you feel dizzy. This can increase your chance of falling. Ask your doctor what other things that you can do to help prevent falls. This information is not intended to replace advice given to you by your health care provider. Make sure you discuss any questions you have with your health care provider. Document Released: 03/12/2009 Document Revised: 10/22/2015 Document Reviewed: 06/20/2014 Elsevier Interactive Patient Education  2017 [employer].

## 2017-02-28 ENCOUNTER — Encounter (HOSPITAL_COMMUNITY): Payer: Medicare HMO | Attending: Oncology

## 2017-02-28 ENCOUNTER — Encounter (HOSPITAL_COMMUNITY): Payer: Medicare HMO

## 2017-02-28 DIAGNOSIS — N183 Chronic kidney disease, stage 3 unspecified: Secondary | ICD-10-CM

## 2017-02-28 DIAGNOSIS — D61818 Other pancytopenia: Secondary | ICD-10-CM | POA: Diagnosis present

## 2017-02-28 DIAGNOSIS — D631 Anemia in chronic kidney disease: Secondary | ICD-10-CM | POA: Diagnosis present

## 2017-02-28 LAB — CBC WITH DIFFERENTIAL/PLATELET
BASOS ABS: 0 10*3/uL (ref 0.0–0.1)
Basophils Relative: 0 %
EOS PCT: 5 %
Eosinophils Absolute: 0.1 10*3/uL (ref 0.0–0.7)
HCT: 38 % — ABNORMAL LOW (ref 39.0–52.0)
Hemoglobin: 12.2 g/dL — ABNORMAL LOW (ref 13.0–17.0)
LYMPHS PCT: 16 %
Lymphs Abs: 0.4 10*3/uL — ABNORMAL LOW (ref 0.7–4.0)
MCH: 29.8 pg (ref 26.0–34.0)
MCHC: 32.1 g/dL (ref 30.0–36.0)
MCV: 92.7 fL (ref 78.0–100.0)
MONO ABS: 0.4 10*3/uL (ref 0.1–1.0)
Monocytes Relative: 15 %
Neutro Abs: 1.6 10*3/uL — ABNORMAL LOW (ref 1.7–7.7)
Neutrophils Relative %: 64 %
PLATELETS: 124 10*3/uL — AB (ref 150–400)
RBC: 4.1 MIL/uL — ABNORMAL LOW (ref 4.22–5.81)
RDW: 13.4 % (ref 11.5–15.5)
WBC: 2.6 10*3/uL — ABNORMAL LOW (ref 4.0–10.5)

## 2017-02-28 NOTE — Progress Notes (Signed)
Aranesp held today for hgb of 12.2.  Pt notified of results. Discharged via wheelchair in c/o caretaker.

## 2017-03-24 ENCOUNTER — Encounter: Payer: Self-pay | Admitting: Internal Medicine

## 2017-03-24 NOTE — Progress Notes (Signed)
This encounter was created in error - please disregard.

## 2017-03-27 ENCOUNTER — Encounter: Payer: Self-pay | Admitting: Internal Medicine

## 2017-03-27 ENCOUNTER — Non-Acute Institutional Stay (SKILLED_NURSING_FACILITY): Payer: Medicare HMO | Admitting: Internal Medicine

## 2017-03-27 DIAGNOSIS — I639 Cerebral infarction, unspecified: Secondary | ICD-10-CM | POA: Diagnosis not present

## 2017-03-27 DIAGNOSIS — N183 Chronic kidney disease, stage 3 unspecified: Secondary | ICD-10-CM

## 2017-03-27 DIAGNOSIS — B20 Human immunodeficiency virus [HIV] disease: Secondary | ICD-10-CM | POA: Diagnosis not present

## 2017-03-27 DIAGNOSIS — D631 Anemia in chronic kidney disease: Secondary | ICD-10-CM

## 2017-03-27 DIAGNOSIS — I5042 Chronic combined systolic (congestive) and diastolic (congestive) heart failure: Secondary | ICD-10-CM

## 2017-03-27 DIAGNOSIS — E1121 Type 2 diabetes mellitus with diabetic nephropathy: Secondary | ICD-10-CM

## 2017-03-27 NOTE — Progress Notes (Signed)
Location:   [facility] Room Number: 148/D Place of Service:  SNF (31) Provider:  Terisa Starr, MD  Patient Care Team: Sinda Du, MD as PCP - General (Pulmonary Disease) Gala Romney Cristopher Estimable, MD as Consulting Physician (Gastroenterology)  Extended Emergency Contact Information Primary Emergency Contact: [facility] Address: [address], [place] [address] [place] Phone: [phone] Relation: Relative Secondary Emergency Contact: [facility] Address: [address] [place] [address] [place] Phone: [phone] Relation: Sister  Code Status:  DNR Goals of care: Advanced Directive information Advanced Directives 03/24/2017  Does Patient Have a Medical Advance Directive? Yes  Type of Advance Directive Out of facility DNR (pink MOST or yellow form)  Does patient want to make changes to medical advance directive? No - Patient declined  Copy of [place] in Chart? No - copy requested  Would patient like information on creating a medical advance directive? -  Pre-existing out of facility DNR order (yellow form or pink MOST form) -     Chief complaint-medical management of chronic medical conditions including chronic CHF-chronic kidney disease-HIV-hypertension-history of CVA-anemia-hyperlipidemia-diabetes type 2-COPD-  HPI:  Keith Young is a 66 y.o. male seen today for medical management of chronic diseases as noted above.  Despite his numerous diagnoses he appears to be doing quite well and stable.  He's gained a small amount of weight this appears to be more appetite related he does not have any increased edema chest congestion or shortness of breath.  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 He is status post AICD placement   He has a history of HIV and is followed by infectious disease he continues on prescribed medications including  Atovaquene and Descovy and Tivicay  He does have a history of type 2 diabetes blood sugars appear to be under good controlled largely in the 90s to mid 100s hemoglobin A1c was 7.4 on most recent lab.  He also has a history of anemia of chronic disease he is followed actually by hematology oncology and has received Aranesp injections hemoglobin appears to be stabilized at 12.2 on lab done earlier today of note he also is on iron.  He does have a history of chronic kidney disease stage III creatinine appears to be relatively baseline at 2.32 BUN of 67 per chart review over the past 3 months his creatinine has ranged from 2.67 down to 1.4 BUN has been in the 30s-70s range he has been followed by nephrology.  He also has a history of hypertension again he is on low-dose Coreg this appears stable again a manual reading of 118/62 tonight see previous listed reading of 134/80.  He continues to have a chronic indwelling Foley catheter with a history of urinary retention he continues on  Myrbetriq--  He also has history CVA but appears to do well with aspirin-he is doing well with supportive care.  He also has a history of a low TSH earlier this year but this appears to have normalize on updated labs.  Currently he is resting in bed comfortably has no complaints he continues to be pleasant and cooperative again his weight gain appears  to be more appetite related     .     Past Medical History:  Diagnosis Date  . AICD (automatic cardioverter/defibrillator) present   . Alcoholism in remission ([place])   . Anemia   . Aortic insufficiency   . Arthritis   . At moderate risk for fall   . Bell's palsy   . Cardiomyopathy ([place])   . Chronic systolic heart failure (HCC)    NYHA Class III  . CKD (chronic kidney disease) stage 3, GFR 30-59 ml/min  (HCC)   . COPD (chronic obstructive pulmonary disease) ([place])   . Essential hypertension, benign   . HIV disease ([place]) 09/06/2016  . Hyperlipidemia   . Noncompliance with medication regimen   . Nonischemic cardiomyopathy (HCC)    EF 15-20%  . NSVT (nonsustained ventricular tachycardia) ([place])   . Numbness of right jaw    Had a stoke in 01/2013. Numbness is occasional, especially when trying to chew.  . Productive cough 08/2013   With brown sputum   . SOB (shortness of breath) on exertion 08/2013  . Stroke ([place]) 01/2013   weakness of right side from CVA  . Type 2 diabetes mellitus ([place])   . Uses hearing aid 2014   recently received new hearing aids   Past Surgical History:  Procedure Laterality Date  . BIOPSY N/A 04/10/2014   Procedure: GASTRIC BIOPSY;  Surgeon: Daneil Dolin, MD;  Location: [facility] ORS;  Service: Endoscopy;  Laterality: N/A;  . BIOPSY  10/12/2015   Procedure: BIOPSY;  Surgeon: Daneil Dolin, MD;  Location: [facility] ENDO SUITE;  Service: Endoscopy;;  stomach bx's  . CARDIAC DEFIBRILLATOR PLACEMENT  11/12/12   [employer] Inogen MINI ICD implanted in [place] at [facility] [facility] per Dr Nelly Laurence note  . COLONOSCOPY WITH PROPOFOL N/A 04/10/2014   RMR: Colonic Diverticulosis  . ESOPHAGOGASTRODUODENOSCOPY (EGD) WITH PROPOFOL N/A 04/10/2014   RMR: Mild erosive reflux esophagitis. Multiple antral polyps likely hyperplastic status post removal by hot snare cautery technique. Diffusely abnormal stomach status post gastric biopsy I suspect some of patients anemaia may be due to intermittent oozing from the stomach. It would be difficult and a risky proposition to attempt complete removal of all of his gastric polyps.  . ESOPHAGOGASTRODUODENOSCOPY (EGD) WITH PROPOFOL N/A 10/12/2015   Procedure: ESOPHAGOGASTRODUODENOSCOPY (EGD) WITH PROPOFOL;  Surgeon: Daneil Dolin, MD;  Location: [facility];  Service: Endoscopy;  Laterality: N/A;  0160 -  moved to 9:15  . POLYPECTOMY N/A 04/10/2014   Procedure: GASTRIC POLYPECTOMY;  Surgeon: Daneil Dolin, MD;  Location: [facility];  Service: Endoscopy;  Laterality: N/A;  . RIGHT HEART CATHETERIZATION N/A 02/06/2014   Procedure: RIGHT HEART CATH;  Surgeon: Larey Dresser, MD;  Location: [facility] CATH LAB;  Service: Cardiovascular;  Laterality: N/A;    No Known Allergies  Outpatient Encounter Prescriptions as of 03/27/2017  Medication Sig  . acetaminophen (TYLENOL) 325 MG tablet Take 2 tablets (650 mg total) by mouth every 4 (four) hours as needed for mild pain (or temp > 37.5 C (99.5 F)).  Marland Kitchen aspirin 325 MG tablet Take 1 tablet (325 mg total) by mouth daily.  Marland Kitchen atorvastatin (LIPITOR) 80 MG tablet Take 80 mg by mouth daily.   Marland Kitchen atovaquone (MEPRON) 750 MG/5ML suspension Take 10 mLs (1,500 mg total) by mouth daily with breakfast.  . carvedilol (COREG) 3.125 MG tablet Take 1 tablet (3.125 mg total) by mouth 2 (two) times  daily with a meal.  . Cholecalciferol (VITAMIN D3) 2000 UNITS TABS Take 2,000 mg by mouth daily.  . digoxin (LANOXIN) 0.125 MG tablet TAKE 1/2 TABLET BY MOUTH DAILY  . dolutegravir (TIVICAY) 50 MG tablet Take 1 tablet (50 mg total) by mouth daily.  Marland Kitchen emtricitabine-tenofovir AF (DESCOVY) 200-25 MG tablet Take 1 tablet by mouth daily.  . ferrous sulfate 325 (65 FE) MG tablet Take 325 mg by mouth daily with breakfast.  . isosorbide dinitrate (ISORDIL) 10 MG tablet Take 10 mg by mouth 3 (three) times daily.   . mirabegron ER (MYRBETRIQ) 25 MG TB24 tablet Take 25 mg by mouth daily.  . Multiple Vitamin (MULTIVITAMIN WITH MINERALS) TABS tablet Take 1 tablet by mouth daily.  . pantoprazole (PROTONIX) 40 MG tablet TAKE ONE TABLET   BY MOUTH   DAILY  . [DISCONTINUED] feeding supplement, ENSURE ENLIVE, (ENSURE ENLIVE) LIQD Take 237 mLs by mouth 2 (two) times daily between meals.   No facility-administered encounter medications on file as of 03/27/2017.      Review of Systems   General he is  not complaining of fever or chills has had some weight gain as noted above.  Skin does not complain of rashes or itching.  Head ears eyes nose mouth and throat no complaints of sore throat difficulty swallowing or visual changes.  Respiratory denies shortness breath or cough.  Cardiac denies chest pain does not have edema.  GI is not complaining of any abdominal discomfort nausea vomiting diarrhea constipation.  GU has an indwelling Foley catheter appears to be tolerating this fairly well does not complain of dysuria.  Muscle skeletal denies joint pain.  Neurologic denies headache dizziness no recent syncopal episodes noted.  Psych no noted behavior problems staff says he is doing well with supportive care  Immunization History  Administered Date(s) Administered  . Hepatitis A, Adult 09/19/2016  . Hepatitis B, adult 09/19/2016  . Influenza-Unspecified 03/13/2015, 07/07/2016, 03/03/2017  . Pneumococcal Conjugate-13 03/03/2017  . Pneumococcal Polysaccharide-23 07/08/2016, 09/19/2016  . Tdap 02/23/2017   Pertinent  Health Maintenance Due  Topic Date Due  . FOOT EXAM  04/27/2017 (Originally 02/10/1961)  . OPHTHALMOLOGY EXAM  04/27/2017 (Originally 02/10/1961)  . URINE MICROALBUMIN  04/27/2017 (Originally 02/10/1961)  . HEMOGLOBIN A1C  08/15/2017  . PNA vac Low Risk Adult (2 of 2 - PPSV23) 09/19/2021  . COLONOSCOPY  04/10/2024   Fall Risk  02/23/2017 11/29/2016 11/29/2016  Falls in the past year? No No No   Functional Status Survey:    Vitals:   03/27/17 1517  BP: 134/80  Pulse: 80  Resp: 16  Temp: (!) 97.2 F (36.2 C)  TempSrc: Oral  SpO2: 94%  Weight: 140 lb 6.4 oz (63.7 kg)  Height: 5\' 3"  (1.6 m)  Of note manual blood pressure was 118/62 Body mass index is 24.87 kg/m. Physical Exam   In general this is a somewhat frail elderly male in no distress lying comfortably in bed he is pleasant smiling cooperative.  His skin is warm and dry I do not note any open areas on  his feet with a history of diabetes.  Eyes sclera and conjunctiva clear visual acuity appears grossly intact.  Oropharynx is clear mucous membranes moist.  Chest is clear to auscultation with somewhat shallow air entry there is no labored breathing.  Heart is regular rate and rhythm with a 2/6 systolic murmur he does not have significant lower extremity edema-pedal pulses are palpable.  Abdomen is soft nontender with positive  bowel sounds.  Muscle skeletal general frailty but moves all extremities 4 strength appears to be intact.  Neurologic as noted above he is alert no lateralizing findings cranial nerves appear to be intact.  Psyche is oriented to self pleasant and appropriate follow simple verbal commands without difficulty.    Labs reviewed:  Recent Labs  09/09/16 0626  10/18/16 0718  12/13/16 0433 02/14/17 0800 02/22/17 1020  NA 142  < > 132*  < > 139 135 135  K 3.4*  < > 5.5*  < > 3.4* 4.3 4.8  CL 112*  < > 99*  < > 110 101 98*  CO2 26  < > 26  < > 23 27 26   GLUCOSE 113*  < > 124*  < > 85 131* 208*  BUN 92*  < > 62*  < > 34* 67* 67*  CREATININE 2.94*  < > 1.54*  < > 1.40* 2.08* 2.32*  CALCIUM 7.6*  < > 9.6  < > 8.0* 9.9  10.3* 9.6  MG 1.6*  --   --   --   --   --   --   PHOS 2.8  < > 4.3  --   --  3.3 3.2  < > = values in this interval not displayed.  Recent Labs  12/10/16 0437 12/11/16 0421 02/14/17 0800 02/22/17 1020  AST 56* 36  --  49*  ALT 55 54  --  57  ALKPHOS 55 41  --  83  BILITOT 0.6 0.7  --  0.8  PROT 6.4* 5.9*  --  8.0  ALBUMIN 2.3* 2.2* 3.5 3.5    Recent Labs  12/20/16 0947 01/03/17 1018 02/22/17 1020 02/28/17 1014  WBC 2.9* 2.7* 2.6* 2.6*  NEUTROABS 1.7 1.5*  --  1.6*  HGB 10.6* 12.3* 12.5* 12.2*  HCT 32.8* 38.8* 38.7* 38.0*  MCV 92.4 96.5 93.0 92.7  PLT 164 128* 89* 124*   Lab Results  Component Value Date   TSH 3.661 02/15/2017   Lab Results  Component Value Date   HGBA1C 7.4 (H) 02/15/2017   Lab Results  Component  Value Date   CHOL 82 02/15/2017   HDL 28 (L) 02/15/2017   LDLCALC 42 02/15/2017   TRIG 61 02/15/2017   CHOLHDL 2.9 02/15/2017    Significant Diagnostic Results in last 30 days:  No results found.  Assessment/Plan  #1-history of chronic CHF combined diastolic and systolic-he is had some mild weight gain but this does not appear to be cardiac related-suspect this is appetite related he does not have significant edema or chest congestion no labored breathing at this point continue to monitor he continues on Coreg as well as Imdur and digoxin --will update a digoxin level  #2-history of chronic kidney disease stage III this appears relatively stable with creatinine of 2.32--it has risen somewhat recently Will update this to ensure stability.  He has been followed by nephrology.  #3 history of HIV is followed by infectious disease continues current medications as noted above.  #4 history of hypertension this appears stable on Coreg recent blood pressures 134/80-118/62.  #5 history of CVA appears to be doing well with supportive care could not really appreciate lateralizing findings he is on high-dose aspirin as well as a statin LDL satisfactory at 42 on lab done last month   #6 anemia --with history of pancytopenia --most likely an element of chronic disease he is followed by oncology hematology and has received Aranesp hemoglobin appears stable  at 12.2 on lab done earlier this month he is also on iron  His white count of 2.6 appears to be relatively baseline as well along with platelets 24,000 .  #7 history of chronic Foley catheter placement as a history of urinary retention this appears stable he also continues on Myrbetriq     #8 history diabetes type 2 this appears to be under decent control with CBGs Z in the 90s to mid 100s hemoglobin A1c was 7.4 on lab done last month September 2018-that's actually up from 6.16 months ago but blood sugars actually appear to be fairly stable  now---this is diet-controlled  Diabetic foot exam appear to be benign I cannot really appreciate any open areas-he is also due for a diabetic eye exam which I will order  #9-history hyperlipidemia he is on a statin LDL was 42 on lab done last month  #10 history of elevated TSH again this normalized on lab done most recently with a TSH of 3.661--free T4 was normal at 0.95 and T3 was normal at 89  #11 COPD this appears stable no recent acute episodes to my knowledge lung exam is fairly benign except for shallow air entry.  Again will update a metabolic panel with his history of chronic kidney disease to keep an eye on his creatinine also will update a digoxin level.  Weight gain I suspect is appetite related he does not show evidence of CHF.  [id number] note greater than 40 minutes spent assessing patient reviewing his chart reviewing his labs discussing his status with nursing-and coordinating and formulating a plan of care for numerous diagnoses-of note greater than 50% of time spent coordinating plan of care

## 2017-03-28 ENCOUNTER — Encounter (HOSPITAL_COMMUNITY): Payer: Medicare HMO

## 2017-03-28 ENCOUNTER — Encounter (HOSPITAL_COMMUNITY)
Admission: RE | Admit: 2017-03-28 | Discharge: 2017-03-28 | Disposition: A | Discharge status: Home / Self Care | Payer: Medicare HMO | Source: Skilled Nursing Facility | Attending: Internal Medicine | Admitting: Internal Medicine

## 2017-03-28 DIAGNOSIS — N3281 Overactive bladder: Secondary | ICD-10-CM | POA: Diagnosis present

## 2017-03-28 DIAGNOSIS — D631 Anemia in chronic kidney disease: Secondary | ICD-10-CM

## 2017-03-28 DIAGNOSIS — R1312 Dysphagia, oropharyngeal phase: Secondary | ICD-10-CM | POA: Diagnosis present

## 2017-03-28 DIAGNOSIS — I480 Paroxysmal atrial fibrillation: Secondary | ICD-10-CM | POA: Insufficient documentation

## 2017-03-28 DIAGNOSIS — N183 Chronic kidney disease, stage 3 (moderate): Principal | ICD-10-CM

## 2017-03-28 DIAGNOSIS — I639 Cerebral infarction, unspecified: Secondary | ICD-10-CM | POA: Insufficient documentation

## 2017-03-28 DIAGNOSIS — D61818 Other pancytopenia: Secondary | ICD-10-CM

## 2017-03-28 DIAGNOSIS — Z9181 History of falling: Secondary | ICD-10-CM | POA: Insufficient documentation

## 2017-03-28 DIAGNOSIS — I5022 Chronic systolic (congestive) heart failure: Secondary | ICD-10-CM | POA: Insufficient documentation

## 2017-03-28 LAB — BASIC METABOLIC PANEL
ANION GAP: 5 (ref 5–15)
BUN: 61 mg/dL — ABNORMAL HIGH (ref 6–20)
CALCIUM: 9.4 mg/dL (ref 8.9–10.3)
CO2: 25 mmol/L (ref 22–32)
Chloride: 103 mmol/L (ref 101–111)
Creatinine, Ser: 2.16 mg/dL — ABNORMAL HIGH (ref 0.61–1.24)
GFR calc non Af Amer: 30 mL/min — ABNORMAL LOW (ref >60)
GFR, EST AFRICAN AMERICAN: 35 mL/min — AB (ref >60)
Glucose, Bld: 87 mg/dL (ref 65–99)
POTASSIUM: 4.4 mmol/L (ref 3.5–5.1)
Sodium: 133 mmol/L — ABNORMAL LOW (ref 135–145)

## 2017-03-28 LAB — CBC WITH DIFFERENTIAL/PLATELET
BASOS PCT: 0 %
Basophils Absolute: 0 10*3/uL (ref 0.0–0.1)
EOS PCT: 8 %
Eosinophils Absolute: 0.2 10*3/uL (ref 0.0–0.7)
HEMATOCRIT: 39.7 % (ref 39.0–52.0)
Hemoglobin: 13 g/dL (ref 13.0–17.0)
Lymphocytes Relative: 21 %
Lymphs Abs: 0.6 10*3/uL — ABNORMAL LOW (ref 0.7–4.0)
MCH: 30.2 pg (ref 26.0–34.0)
MCHC: 32.7 g/dL (ref 30.0–36.0)
MCV: 92.3 fL (ref 78.0–100.0)
MONO ABS: 0.3 10*3/uL (ref 0.1–1.0)
MONOS PCT: 10 %
NEUTROS ABS: 1.7 10*3/uL (ref 1.7–7.7)
Neutrophils Relative %: 60 %
PLATELETS: 109 10*3/uL — AB (ref 150–400)
RBC: 4.3 MIL/uL (ref 4.22–5.81)
RDW: 13.6 % (ref 11.5–15.5)
WBC: 2.8 10*3/uL — ABNORMAL LOW (ref 4.0–10.5)

## 2017-03-28 LAB — DIGOXIN LEVEL: Digoxin Level: <0.2 ng/mL — ABNORMAL LOW (ref 0.8–2.0)

## 2017-03-28 NOTE — Progress Notes (Signed)
Aranesp held today for hgb of 13.  Pt notified of lab results. Discharged via wheelchair in [facility] staff.

## 2017-04-11 ENCOUNTER — Encounter (HOSPITAL_COMMUNITY)
Admission: RE | Admit: 2017-04-11 | Discharge: 2017-04-11 | Disposition: A | Discharge status: Home / Self Care | Payer: Medicare HMO | Source: Skilled Nursing Facility | Attending: Internal Medicine | Admitting: Internal Medicine

## 2017-04-11 DIAGNOSIS — N3281 Overactive bladder: Secondary | ICD-10-CM | POA: Insufficient documentation

## 2017-04-11 DIAGNOSIS — I480 Paroxysmal atrial fibrillation: Secondary | ICD-10-CM | POA: Diagnosis not present

## 2017-04-11 DIAGNOSIS — R1312 Dysphagia, oropharyngeal phase: Secondary | ICD-10-CM | POA: Diagnosis present

## 2017-04-11 DIAGNOSIS — Z9181 History of falling: Secondary | ICD-10-CM | POA: Diagnosis not present

## 2017-04-11 DIAGNOSIS — I639 Cerebral infarction, unspecified: Secondary | ICD-10-CM | POA: Diagnosis present

## 2017-04-11 DIAGNOSIS — I5022 Chronic systolic (congestive) heart failure: Secondary | ICD-10-CM | POA: Insufficient documentation

## 2017-04-11 DIAGNOSIS — I110 Hypertensive heart disease with heart failure: Secondary | ICD-10-CM | POA: Diagnosis not present

## 2017-04-11 LAB — BASIC METABOLIC PANEL
ANION GAP: 7 (ref 5–15)
BUN: 57 mg/dL — ABNORMAL HIGH (ref 6–20)
CALCIUM: 9.4 mg/dL (ref 8.9–10.3)
CO2: 25 mmol/L (ref 22–32)
CREATININE: 2.05 mg/dL — AB (ref 0.61–1.24)
Chloride: 104 mmol/L (ref 101–111)
GFR calc non Af Amer: 32 mL/min — ABNORMAL LOW (ref >60)
GFR, EST AFRICAN AMERICAN: 37 mL/min — AB (ref >60)
Glucose, Bld: 105 mg/dL — ABNORMAL HIGH (ref 65–99)
Potassium: 4.5 mmol/L (ref 3.5–5.1)
SODIUM: 136 mmol/L (ref 135–145)

## 2017-04-18 ENCOUNTER — Ambulatory Visit (INDEPENDENT_AMBULATORY_CARE_PROVIDER_SITE_OTHER): Payer: Medicare HMO | Admitting: Internal Medicine

## 2017-04-18 ENCOUNTER — Encounter: Payer: Self-pay | Admitting: Internal Medicine

## 2017-04-18 VITALS — BP 117/72 | HR 75 | Temp 98.4°F

## 2017-04-18 DIAGNOSIS — B20 Human immunodeficiency virus [HIV] disease: Secondary | ICD-10-CM

## 2017-04-18 DIAGNOSIS — Z9189 Other specified personal risk factors, not elsewhere classified: Secondary | ICD-10-CM | POA: Diagnosis not present

## 2017-04-18 DIAGNOSIS — E43 Unspecified severe protein-calorie malnutrition: Secondary | ICD-10-CM | POA: Diagnosis not present

## 2017-04-18 DIAGNOSIS — N183 Chronic kidney disease, stage 3 unspecified: Secondary | ICD-10-CM

## 2017-04-18 NOTE — Assessment & Plan Note (Signed)
His renal function has improved and no medication adjustment indicated.

## 2017-04-18 NOTE — Assessment & Plan Note (Signed)
Improved caloric intake and weight gain.

## 2017-04-18 NOTE — Assessment & Plan Note (Signed)
Doing well.  Will continue Tivicay and Descovy and rtc 6 months.

## 2017-04-18 NOTE — Assessment & Plan Note (Signed)
Will continue mepron now and if CD4 rises, will consider stopping next visit.

## 2017-04-18 NOTE — Progress Notes (Signed)
   Subjective:    Patient ID: Keith Young, male    DOB: [birth year], 66 y.o.   MRN: [id number]  HPI Here for follow up of HIV He was a new patient last year.  His initial CD4 was 240 but has been 170 the last two times.  He denies any missed doses and medicaiton is provided to him by the [facility].  He does not have any complaints. He has gained some weight, eating well.  Creat last checked last week and stable at 2.05.     Review of Systems  HENT: Negative for trouble swallowing.   Gastrointestinal: Negative for diarrhea.  Skin: Negative for rash.       Objective:   Physical Exam  Constitutional:  Healthier appearing  HENT:  Mouth/Throat: No oropharyngeal exudate.  Eyes: No scleral icterus.  Cardiovascular: Normal rate, regular rhythm and normal heart sounds.  Pulmonary/Chest: Effort normal and breath sounds normal. No respiratory distress.  Lymphadenopathy:    He has no cervical adenopathy.  Skin: No rash noted.   SH: lives at the [facility]:

## 2017-04-19 LAB — T-HELPER CELL (CD4) - (RCID CLINIC ONLY)
CD4 % Helper T Cell: 38 % (ref 33–55)
CD4 T CELL ABS: 180 /uL — AB (ref 400–2700)

## 2017-04-25 ENCOUNTER — Encounter (HOSPITAL_BASED_OUTPATIENT_CLINIC_OR_DEPARTMENT_OTHER): Payer: Medicare HMO | Admitting: Oncology

## 2017-04-25 ENCOUNTER — Encounter (HOSPITAL_COMMUNITY): Payer: Medicare HMO

## 2017-04-25 ENCOUNTER — Encounter (HOSPITAL_COMMUNITY): Payer: Medicare HMO | Attending: Oncology

## 2017-04-25 ENCOUNTER — Encounter (HOSPITAL_COMMUNITY): Payer: Self-pay | Admitting: Oncology

## 2017-04-25 ENCOUNTER — Other Ambulatory Visit: Payer: Self-pay

## 2017-04-25 VITALS — BP 118/66 | HR 67 | Temp 98.0°F | Resp 18

## 2017-04-25 DIAGNOSIS — N183 Chronic kidney disease, stage 3 unspecified: Secondary | ICD-10-CM

## 2017-04-25 DIAGNOSIS — D509 Iron deficiency anemia, unspecified: Secondary | ICD-10-CM

## 2017-04-25 DIAGNOSIS — D631 Anemia in chronic kidney disease: Secondary | ICD-10-CM

## 2017-04-25 DIAGNOSIS — D61818 Other pancytopenia: Secondary | ICD-10-CM | POA: Diagnosis not present

## 2017-04-25 DIAGNOSIS — N189 Chronic kidney disease, unspecified: Principal | ICD-10-CM

## 2017-04-25 LAB — CBC WITH DIFFERENTIAL/PLATELET
BASOS ABS: 0 10*3/uL (ref 0.0–0.1)
BASOS PCT: 1 %
EOS PCT: 9 %
Eosinophils Absolute: 0.2 10*3/uL (ref 0.0–0.7)
HCT: 40.2 % (ref 39.0–52.0)
Hemoglobin: 12.3 g/dL — ABNORMAL LOW (ref 13.0–17.0)
LYMPHS PCT: 20 %
Lymphs Abs: 0.5 10*3/uL — ABNORMAL LOW (ref 0.7–4.0)
MCH: 28.8 pg (ref 26.0–34.0)
MCHC: 30.6 g/dL (ref 30.0–36.0)
MCV: 94.1 fL (ref 78.0–100.0)
MONO ABS: 0.4 10*3/uL (ref 0.1–1.0)
Monocytes Relative: 17 %
NEUTROS ABS: 1.3 10*3/uL — AB (ref 1.7–7.7)
Neutrophils Relative %: 53 %
PLATELETS: 127 10*3/uL — AB (ref 150–400)
RBC: 4.27 MIL/uL (ref 4.22–5.81)
RDW: 13 % (ref 11.5–15.5)
WBC: 2.5 10*3/uL — AB (ref 4.0–10.5)

## 2017-04-25 LAB — HIV-1 RNA QUANT-NO REFLEX-BLD
HIV 1 RNA QUANT: 28 {copies}/mL — AB
HIV-1 RNA QUANT, LOG: 1.45 {Log_copies}/mL — AB

## 2017-04-25 NOTE — Progress Notes (Signed)
[name],  [address]   CLINIC:  Medical Oncology/Hematology Progress Note  PCP:  Sinda Du, [place] [address] [phone]   REASON FOR VISIT:  Follow-up for anemia in setting of CKD  CURRENT THERAPY: Aranesp 50 mcg every 2 weeks   HISTORY OF PRESENT ILLNESS:  (From [name], PA-C's last note on 04/11/16)     INTERVAL HISTORY:  [name] returns for follow-up for anemia, in the setting of Stage III-IV chronic kidney disease.   He states that he has been doing well since his last visit. He has not received aranesp since 12/20/16; his hemoglobin has been consistently between 12-13 g/dL. He denies any recent infections. He denies any fevers/chills, chest pain, shortness of breath, abdominal pain, N/V/D, headaches.  REVIEW OF SYSTEMS:  Review of Systems  Constitutional: Negative for appetite change.  HENT:  Negative.   Eyes: Negative.   Respiratory: Negative.   Cardiovascular: Negative.  Negative for chest pain.  Gastrointestinal: Negative.  Negative for abdominal pain.  Endocrine: Negative.   Genitourinary: Negative.    Musculoskeletal: Negative.   Skin: Negative.   Neurological: Negative.   Hematological: Negative.   Psychiatric/Behavioral: Negative.   All other systems reviewed and are negative.    PAST MEDICAL/SURGICAL HISTORY:  Past Medical History:  Diagnosis Date  . AICD (automatic cardioverter/defibrillator) present   . Alcoholism in remission ([place])   . Anemia   . Aortic insufficiency   . Arthritis   . At moderate risk for fall   . Bell's palsy   . Cardiomyopathy ([place])   . Chronic systolic heart failure (HCC)    NYHA Class III  . CKD (chronic kidney disease) stage 3, GFR 30-59 ml/min (HCC)   . COPD (chronic obstructive pulmonary disease) ([place])   . Essential hypertension, benign   . HIV disease ([place]) 09/06/2016  . Hyperlipidemia   . Noncompliance with  medication regimen   . Nonischemic cardiomyopathy (HCC)    EF 15-20%  . NSVT (nonsustained ventricular tachycardia) ([place])   . Numbness of right jaw    Had a stoke in 01/2013. Numbness is occasional, especially when trying to chew.  . Productive cough 08/2013   With brown sputum   . SOB (shortness of breath) on exertion 08/2013  . Stroke ([place]) 01/2013   weakness of right side from CVA  . Type 2 diabetes mellitus ([place])   . Uses hearing aid 2014   recently received new hearing aids   Past Surgical History:  Procedure Laterality Date  . BIOPSY N/A 04/10/2014   Procedure: GASTRIC BIOPSY;  Surgeon: Daneil Dolin, MD;  Location: [facility] ORS;  Service: Endoscopy;  Laterality: N/A;  . BIOPSY  10/12/2015   Procedure: BIOPSY;  Surgeon: Daneil Dolin, MD;  Location: [facility] END[facility] SUITE;  Service: Endoscopy;;  stomach bx's  . CARDIAC DEFIBRILLATOR PLACEMENT  11/12/12   [employer] Inogen MINI ICD implanted in [place] at [facility] per Dr Nelly Laurence note  . COLONOSCOPY WITH PROPOFOL N/A 04/10/2014   RMR: Colonic Diverticulosis  . ESOPHAGOGASTRODUODENOSCOPY (EGD) WITH PROPOFOL N/A 04/10/2014   RMR: Mild erosive reflux esophagitis. Multiple antral polyps likely hyperplastic status post removal by hot snare cautery technique. Diffusely abnormal stomach status post gastric biopsy I suspect some of patients anemaia may be due to intermittent oozing from the stomach. It would be difficult and a risky proposition to  attempt complete removal of all of his gastric polyps.  . ESOPHAGOGASTRODUODENOSCOPY (EGD) WITH PROPOFOL N/A 10/12/2015   Procedure: ESOPHAGOGASTRODUODENOSCOPY (EGD) WITH PROPOFOL;  Surgeon: Daneil Dolin, MD;  Location: [facility];  Service: Endoscopy;  Laterality: N/A;  1601 - moved to 9:15  . POLYPECTOMY N/A 04/10/2014   Procedure: GASTRIC POLYPECTOMY;  Surgeon: Daneil Dolin, MD;  Location: [facility]S;  Service: Endoscopy;  Laterality: N/A;  .  RIGHT HEART CATHETERIZATION N/A 02/06/2014   Procedure: RIGHT HEART CATH;  Surgeon: Larey Dresser, MD;  Location: [facility] CATH LAB;  Service: Cardiovascular;  Laterality: N/A;     SOCIAL HISTORY:  Social History   Socioeconomic History  . Marital status: Single    Spouse name: Not on file  . Number of children: Not on file  . Years of education: Not on file  . Highest education level: Not on file  Social Needs  . Financial resource strain: Not on file  . Food insecurity - worry: Not on file  . Food insecurity - inability: Not on file  . Transportation needs - medical: Not on file  . Transportation needs - non-medical: Not on file  Occupational History  . Occupation: disabled  Tobacco Use  . Smoking status: Former Smoker    Types: Cigarettes    Last attempt to quit: 11/05/1993    Years since quitting: 23.4  . Smokeless tobacco: Never Used  Substance and Sexual Activity  . Alcohol use: Yes    Comment: former heavy ETOH, none recently  . Drug use: No    Comment: prior history of cocaine use, smoking   . Sexual activity: Yes    Birth control/protection: None  Other Topics Concern  . Not on file  Social History Narrative   Recently moved from [place] to be with family in [place] (2015).  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  Has h/o polysubstance abuse.  Baseline - able to ambulate, mild cognitive delay, able to toilet and shower himself, occasional use of walker/cane.             FAMILY HISTORY:  Family History  Problem Relation Age of Onset  . Kidney disease Mother   . Kidney disease Sister   . Colon cancer Neg Hx     CURRENT MEDICATIONS:  Outpatient Encounter Prescriptions as of 07/18/2016  Medication Sig  . acetaminophen (TYLENOL) 325 MG tablet Take 2 tablets (650 mg total) by mouth every 4 (four) hours as needed for mild pain (or temp > 37.5 C (99.5 F)).  Marland Kitchen aspirin 325 MG tablet Take 1 tablet (325 mg total) by mouth daily.  Marland Kitchen atorvastatin  (LIPITOR) 80 MG tablet Take 80 mg by mouth daily.   . carvedilol (COREG) 25 MG tablet Take 12.5 mg by mouth 2 (two) times daily with a meal.   . Cholecalciferol (VITAMIN D3) 2000 UNITS TABS Take 2,000 mg by mouth daily.  . digoxin (LANOXIN) 0.125 MG tablet TAKE 1/2 TABLET BY MOUTH DAILY  . feeding supplement, ENSURE ENLIVE, (ENSURE ENLIVE) LIQD Take 237 mLs by mouth 2 (two) times daily between meals.  . ferrous sulfate 325 (65 FE) MG tablet Take 325 mg by mouth daily with breakfast.  . furosemide (LASIX) 40 MG tablet Take 40 mg by mouth daily.  . isosorbide dinitrate (ISORDIL) 20 MG tablet Take 40 mg by mouth 3 (three) times daily.   Marland Kitchen losartan (COZAAR) 25 MG tablet Take 25 mg by mouth daily.  Marland Kitchen  mirabegron ER (MYRBETRIQ) 25 MG TB24 tablet Take 25 mg by mouth daily.  . mirtazapine (REMERON) 7.5 MG tablet Take 1 tablet by mouth daily.  . Multiple Vitamin (MULTIVITAMIN WITH MINERALS) TABS tablet Take 1 tablet by mouth daily.  . pantoprazole (PROTONIX) 40 MG tablet TAKE ONE TABLET   BY MOUTH   DAILY  . fluconazole (DIFLUCAN) 100 MG tablet Take 1 tablet (100 mg total) by mouth daily.   No facility-administered encounter medications on file as of 07/18/2016.     ALLERGIES:  No Known Allergies   PHYSICAL EXAM:  ECOG Performance status: 3 - Requires considerable assistance    Vitals:   04/25/17 1108  BP: 118/66  Pulse: 67  Resp: 18  Temp: 98 F (36.7 C)  SpO2: 97%   There were no vitals filed for this visit.   Physical Exam  Constitutional: He is oriented to person, place, and time. No distress.  Thin, frail male in no distress   HENT:  Head: Normocephalic.  Eyes: Conjunctivae are normal. Pupils are equal, round, and reactive to light. No scleral icterus.  Neck: Normal range of motion. Neck supple.  Cardiovascular: Normal rate and regular rhythm.  Pacemaker in place   Pulmonary/Chest: Effort normal and breath sounds normal. No respiratory distress. He has no wheezes. He has no  rales.  Abdominal: Soft. Bowel sounds are normal. There is no tenderness.  Musculoskeletal: He exhibits no edema.  Lymphadenopathy:    He has no cervical adenopathy.  Neurological: He is alert and oriented to person, place, and time.  Mild cognitive delay, particularly when answering questions   Skin: Skin is warm and dry. No rash noted.  Psychiatric: Mood and affect normal.     LABORATORY DATA:  I have reviewed the labs as listed.  CBC    Component Value Date/Time   WBC 2.5 (L) 04/25/2017 1036   RBC 4.27 04/25/2017 1036   HGB 12.3 (L) 04/25/2017 1036   HCT 40.2 04/25/2017 1036   PLT 127 (L) 04/25/2017 1036   MCV 94.1 04/25/2017 1036   MCH 28.8 04/25/2017 1036   MCHC 30.6 04/25/2017 1036   RDW 13.0 04/25/2017 1036   LYMPHSABS 0.5 (L) 04/25/2017 1036   MONOABS 0.4 04/25/2017 1036   EOSABS 0.2 04/25/2017 1036   BASOSABS 0.0 04/25/2017 1036   CMP Latest Ref Rng & Units 04/11/2017 03/28/2017 02/22/2017  Glucose 65 - 99 mg/dL 105(H) 87 208(H)  BUN 6 - 20 mg/dL 57(H) 61(H) 67(H)  Creatinine 0.61 - 1.24 mg/dL 2.05(H) 2.16(H) 2.32(H)  Sodium 135 - 145 mmol/L 136 133(L) 135  Potassium 3.5 - 5.1 mmol/L 4.5 4.4 4.8  Chloride 101 - 111 mmol/L 104 103 98(L)  CO2 22 - 32 mmol/L 25 25 26   Calcium 8.9 - 10.3 mg/dL 9.4 9.4 9.6  Total Protein 6.5 - 8.1 g/dL - - 8.0  Total Bilirubin 0.3 - 1.2 mg/dL - - 0.8  Alkaline Phos 38 - 126 U/L - - 83  AST 15 - 41 U/L - - 49(H)  ALT 17 - 63 U/L - - 57    PENDING LABS:    DIAGNOSTIC IMAGING:  Recent CT head: 06/26/16   RIGHT KNEE - COMPLETE 4+ VIEW 08/31/2016  IMPRESSION: No acute findings.  Extensive peripheral vascular calcification and old bone infarcts.  RENAL / URINARY TRACT ULTRASOUND COMPLETE 09/05/2016  PORTABLE CHEST 1 VIEW 09/05/2016  No active disease.    IMPRESSION: 1. No hydronephrosis. 2. Bilateral renal cysts.    PATHOLOGY:  Last  EGD (Dr. Gala Romney): 10/12/15      ASSESSMENT & PLAN:   Anemia due to CKD,  iron deficiency, and anemia of chronic disease:   - Hemoglobin 12.3 g/dL today. He has not received aranesp since 12/20/16; his hemoglobin has been consistently between 12-13 g/dL. - Will cancel his aranesp at this time since his hemoglobin has been so high. Will resume if his hemoglobin drops below 11 g/dL. - RTC in 3 months for follow up with labs.  Orders Placed This Encounter  Procedures  . CBC with Differential    Standing Status:   Future    Standing Expiration Date:   04/25/2018  . Comprehensive metabolic panel    Standing Status:   Future    Standing Expiration Date:   04/25/2018  . Iron and TIBC    Standing Status:   Future    Standing Expiration Date:   04/25/2018  . Ferritin    Standing Status:   Future    Standing Expiration Date:   04/25/2018  . Vitamin B12    Standing Status:   Future    Standing Expiration Date:   04/25/2018  . Folate    Standing Status:   Future    Standing Expiration Date:   04/25/2018      All questions were answered to patient's stated satisfaction. Encouraged patient to call with any new concerns or questions before his next visit to the cancer center and we can certain see him sooner, if needed.    Twana First, MD

## 2017-04-25 NOTE — Patient Instructions (Signed)
[facility] at [facility] Discharge Instructions  RECOMMENDATIONS MADE BY THE CONSULTANT AND ANY TEST RESULTS WILL BE SENT TO YOUR REFERRING PHYSICIAN.  You were seen today by Dr. Louise Zhou    Thank you for choosing [facility] at [facility] to provide your oncology and hematology care.  To afford each patient quality time with our provider, please arrive at least 15 minutes before your scheduled appointment time.    If you have a lab appointment with the Cancer Center please come in thru the  Main Entrance and check in at the main information desk  You need to re-schedule your appointment should you arrive 10 or more minutes late.  We strive to give you quality time with our providers, and arriving late affects you and other patients whose appointments are after yours.  Also, if you no show three or more times for appointments you may be dismissed from the clinic at the providers discretion.     Again, thank you for choosing [facility].  Our hope is that these requests will decrease the amount of time that you wait before being seen by our physicians.       _____________________________________________________________  Should you have questions after your visit to [facility], please contact our office at [phone] between the hours of 8:30 a.m. and 4:30 p.m.  Voicemails left after 4:30 p.m. will not be returned until the following business day.  For prescription refill requests, have your pharmacy contact our office.       Resources For Cancer Patients and their Caregivers ? American Cancer Society: Can assist with transportation, wigs, general needs, runs Look Good Feel Better.        1-888-227-6333 ? Cancer Care: Provides financial assistance, online support groups, medication/co-pay assistance.  1-800-813-HOPE (4673) ? [facility] Assists [place] cancer patients and their  families through emotional , educational and financial support.  336-427-4357 ? [place] DSS Where to apply for food stamps, Medicaid and utility assistance. 336-342-1394 ? [facility]: Transportation to medical appointments. 336-347-2287 ? Social Security Administration: May apply for disability if have a Stage IV cancer. 336-342-7796 1-800-772-1213 ? [place] Aging, Disability and Transit Services: Assists with nutrition, care and transit needs. 336-349-2343  Cancer Center Support Programs: @10RELATIVEDAYS@ > Cancer Support Group  2nd Tuesday of the month 1pm-2pm, Journey Room  > Creative Journey  3rd Tuesday of the month 1130am-1pm, Journey Room  > Look Good Feel Better  1st Wednesday of the month 10am-12 noon, Journey Room (Call American [employer] to register 1-800-395-5775)    

## 2017-04-25 NOTE — Progress Notes (Signed)
Patient's Hemoglobin today is 12.3, we will hold Aranesp injection per ordered parameters.

## 2017-04-25 NOTE — Patient Instructions (Signed)
[place] at [facility] Discharge Instructions  RECOMMENDATIONS MADE BY THE CONSULTANT AND ANY TEST RESULTS WILL BE SENT TO YOUR REFERRING PHYSICIAN.  Your hgb is 12.3, we will hold your Aranesp injection today  Thank you for choosing [place] at [facility] to provide your oncology and hematology care.  To afford each patient quality time with our provider, please arrive at least 15 minutes before your scheduled appointment time.    If you have a lab appointment with the [facility] please come in thru the  Main Entrance and check in at the main information desk  You need to re-schedule your appointment should you arrive 10 or more minutes late.  We strive to give you quality time with our providers, and arriving late affects you and other patients whose appointments are after yours.  Also, if you no show three or more times for appointments you may be dismissed from the clinic at the providers discretion.     Again, thank you for choosing [facility].  Our hope is that these requests will decrease the amount of time that you wait before being seen by our physicians.       _____________________________________________________________  Should you have questions after your visit to [facility], please contact our office at [phone] between the hours of 8:30 a.m. and 4:30 p.m.  Voicemails left after 4:30 p.m. will not be returned until the following business day.  For prescription refill requests, have your pharmacy contact our office.       Resources For Cancer Patients and their Caregivers ? American Cancer Society: Can assist with transportation, wigs, general needs, runs Look Good Feel Better.        (463)706-3886 ? Cancer Care: Provides financial assistance, online support groups, medication/co-pay assistance.  1-800-813-HOPE 986 653 8956) ? [facility] Assists [place] cancer  patients and their families through emotional , educational and financial support.  978-306-1520 ? [place] DSS Where to apply for food stamps, Medicaid and utility assistance. 581-159-6200 ? RCATS: Transportation to medical appointments. 437-200-7542 ? Social Security Administration: May apply for disability if have a Stage IV cancer. 954-143-3902 951 660 7565 ? [employer], Disability and Transit Services: Assists with nutrition, care and transit needs. [place] Support Programs: @10RELATIVEDAYS @ > Cancer Support Group  2nd Tuesday of the month 1pm-2pm, Journey Room  > Creative Journey  3rd Tuesday of the month 1130am-1pm, Journey Room  > Look Good Feel Better  1st Wednesday of the month 10am-12 noon, Journey Room (Call [place] to register [phone])

## 2017-04-26 ENCOUNTER — Encounter: Payer: Self-pay | Admitting: Internal Medicine

## 2017-04-26 ENCOUNTER — Non-Acute Institutional Stay (SKILLED_NURSING_FACILITY): Payer: Medicare HMO | Admitting: Internal Medicine

## 2017-04-26 DIAGNOSIS — R0981 Nasal congestion: Secondary | ICD-10-CM | POA: Diagnosis not present

## 2017-04-26 DIAGNOSIS — I5042 Chronic combined systolic (congestive) and diastolic (congestive) heart failure: Secondary | ICD-10-CM

## 2017-04-26 DIAGNOSIS — R05 Cough: Secondary | ICD-10-CM

## 2017-04-26 DIAGNOSIS — R059 Cough, unspecified: Secondary | ICD-10-CM

## 2017-04-26 DIAGNOSIS — D649 Anemia, unspecified: Secondary | ICD-10-CM | POA: Diagnosis not present

## 2017-04-26 NOTE — Progress Notes (Signed)
Location:   [facility] Room Number: 148/D Place of Service:  SNF [phone]) Provider:  Terisa Starr, MD  Patient Care Team: Sinda Du, MD as PCP - General (Pulmonary Disease) Gala Romney Cristopher Estimable, MD as Consulting Physician (Gastroenterology)  Extended Emergency Contact Information Primary Emergency Contact: [facility] Address: [address], [place] [address] [place] Phone: [phone] Relation: Relative Secondary Emergency Contact: [facility] Address: [address] [place] [address] [place] Phone: [phone] Relation: Sister  Code Status:  DNR Goals of care: Advanced Directive information Advanced Directives 04/26/2017  Does Patient Have a Medical Advance Directive? Yes  Type of Advance Directive Out of facility DNR (pink MOST or yellow form)  Does patient want to make changes to medical advance directive? No - Patient declined  Copy of [place] in Chart? No - copy requested  Would patient like information on creating a medical advance directive? -  Pre-existing out of facility DNR order (yellow form or pink MOST form) -     Chief Complaint  Patient presents with  . Acute Visit    Patien c/o Nasal Drip    HPI:  Pt is a 66 y.o. male seen today for an acute visit for follow-up of nasal drip as well as cough.  Patient apparently developed a cough and some nasal drainage over the weekend-chest x-ray was done which did not show any evidence of pneumonia-it did show congestive heart failure with interstitial edema increased compared to x-ray in June 2018- he continues to have intermittent cough and some nasal discharge-he is not complaining of any increased shortness of breath-does have a history of COPD but this has been stable for some time  His other medical conditions include history of chronic kidney disease  HIV hypertension CVA  anemia hyperlipidemia type 2 diabetes. 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  .  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  He has had some mild weight gain but this appears to be more due to a good appetite   Past Medical History:  Diagnosis Date  . AICD (automatic cardioverter/defibrillator) present   . Alcoholism in remission ([place])   . Anemia   . Aortic insufficiency   . Arthritis   . At moderate risk for fall   . Bell's palsy   . Cardiomyopathy ([place])   . Chronic systolic heart failure (HCC)    NYHA Class III  . CKD (chronic kidney disease) stage 3, GFR 30-59 ml/min (HCC)   . COPD (chronic obstructive pulmonary disease) ([place])   . Essential hypertension, benign   . HIV disease ([place]) 09/06/2016  . Hyperlipidemia   . Noncompliance with medication regimen   . Nonischemic cardiomyopathy (HCC)    EF 15-20%  . NSVT (nonsustained ventricular tachycardia) ([place])   . Numbness of right jaw    Had a stoke in 01/2013. Numbness is occasional, especially when trying to chew.  . Productive cough 08/2013   With brown sputum   .  SOB (shortness of breath) on exertion 08/2013  . Stroke ([place]) 01/2013   weakness of right side from CVA  . Type 2 diabetes mellitus ([place])   . Uses hearing aid 2014   recently received new hearing aids   Past Surgical History:  Procedure Laterality Date  . BIOPSY N/A 04/10/2014   Procedure: GASTRIC BIOPSY;  Surgeon: Daneil Dolin, MD;  Location: [facility] ORS;  Service: Endoscopy;  Laterality: N/A;  . BIOPSY  10/12/2015   Procedure: BIOPSY;  Surgeon: Daneil Dolin, MD;  Location: [facility] ENDO SUITE;  Service: Endoscopy;;  stomach bx's    . CARDIAC DEFIBRILLATOR PLACEMENT  11/12/12   [employer] Inogen MINI ICD implanted in [place] at [facility] per Dr Nelly Laurence note  . COLONOSCOPY WITH PROPOFOL N/A 04/10/2014   RMR: Colonic Diverticulosis  . ESOPHAGOGASTRODUODENOSCOPY (EGD) WITH PROPOFOL N/A 04/10/2014   RMR: Mild erosive reflux esophagitis. Multiple antral polyps likely hyperplastic status post removal by hot snare cautery technique. Diffusely abnormal stomach status post gastric biopsy I suspect some of patients anemaia may be due to intermittent oozing from the stomach. It would be difficult and a risky proposition to attempt complete removal of all of his gastric polyps.  . ESOPHAGOGASTRODUODENOSCOPY (EGD) WITH PROPOFOL N/A 10/12/2015   Procedure: ESOPHAGOGASTRODUODENOSCOPY (EGD) WITH PROPOFOL;  Surgeon: Daneil Dolin, MD;  Location: [facility] ENDO SU[facility];  Service: Endoscopy;  Laterality: N/A;  6195 - moved to 9:15  . POLYPECTOMY N/A 04/10/2014   Procedure: GASTRIC POLYPECTOMY;  Surgeon: Daneil Dolin, MD;  Location: [facility] ORS;  Service: Endoscopy;  Laterality: N/A;  . RIGHT HEART CATHETERIZATION N/A 02/06/2014   Procedure: RIGHT HEART CATH;  Surgeon: Larey Dresser, MD;  Location: [facility] CATH LAB;  Service: Cardiovascular;  Laterality: N/A;    No Known Allergies  Outpatient Encounter Medications as of 04/26/2017  Medication Sig  . acetaminophen (TYLENOL) 325 MG tablet Take 2 tablets (650 mg total) by mouth every 4 (four) hours as needed for mild pain (or temp > 37.5 C (99.5 F)).  Marland Kitchen aspirin 325 MG tablet Take 1 tablet (325 mg total) by mouth daily.  Marland Kitchen atorvastatin (LIPITOR) 80 MG tablet Take 80 mg by mouth daily.   Marland Kitchen atovaquone (MEPRON) 750 MG/5ML suspension Take 10 mLs (1,500 mg total) by mouth daily with breakfast.  . carvedilol (COREG) 3.125 MG tablet Take 1 tablet (3.125 mg total) by mouth 2 (two) times daily with a meal.  . Cholecalciferol (VITAMIN D3) 2000 UNITS TABS  Take 2,000 mg by mouth daily.  . digoxin (LANOXIN) 0.125 MG tablet TAKE 1/2 TABLET BY MOUTH DAILY  . dolutegravir (TIVICAY) 50 MG tablet Take 1 tablet (50 mg total) by mouth daily.  Marland Kitchen emtricitabine-tenofovir AF (DESCOVY) 200-25 MG tablet Take 1 tablet by mouth daily.  . ferrous sulfate 325 (65 FE) MG tablet Take 325 mg by mouth daily with breakfast.  . isosorbide dinitrate (ISORDIL) 10 MG tablet Take 10 mg by mouth 3 (three) times daily.   . mirabegron ER (MYRBETRIQ) 25 MG TB24 tablet Take 25 mg by mouth daily.  . Multiple Vitamin (MULTIVITAMIN WITH MINERALS) TABS tablet Take 1 tablet by mouth daily.  . pantoprazole (PROTONIX) 40 MG tablet TAKE ONE TABLET   BY MOUTH   DAILY   No facility-administered encounter medications on file as of 04/26/2017.     Review of Systems   General does not complain of fever chills has had some mild weight  gain.  Skin does not complain of rashes itching or diaphoresis.  Head ears eyes nose mouth and throat is not complaining of visual changes denies any sore throat continues to have apparently some clear nasal drainage.  Respiratory has a nonproductive cough with intermittent does not complain of shortness of breath.  Cardiac does not complain of any chest pain or significant lower extremity edema.  GI does not complain of any abdominal discomfort nausea vomiting diarrhea constipation.  GU continues with an indwelling Foley catheter does not complain of dysuria.  Musculoskeletal is not complaining of any joint pain.  Neurologic does not complain of any headache dizziness or syncope.  Psych no behaviors have been noted does not complain of being depressed or anxious.    Immunization History  Administered Date(s) Administered  . Hepatitis A, Adult 09/19/2016  . Hepatitis B, adult 09/19/2016  . Influenza-Unspecified 03/13/2015, 07/07/2016, 03/03/2017  . Pneumococcal Conjugate-13 03/03/2017  . Pneumococcal Polysaccharide-23 07/08/2016, 09/19/2016    . Tdap 02/23/2017   Pertinent  Health Maintenance Due  Topic Date Due  . FOOT EXAM  04/27/2017 (Originally 02/10/1961)  . OPHTHALMOLOGY EXAM  04/27/2017 (Originally 02/10/1961)  . URINE MICROALBUMIN  04/27/2017 (Originally 02/10/1961)  . HEMOGLOBIN A1C  08/15/2017  . PNA vac Low Risk Adult (2 of 2 - PPSV23) 09/19/2021  . COLONOSCOPY  04/10/2024   Fall Risk  04/18/2017 02/23/2017 11/29/2016 11/29/2016  Falls in the past year? No No No No   Functional Status Survey:    Vitals:   04/26/17 1056  BP: 112/76  Pulse: 70  Resp: 18  Temp: 98.2 F (36.8 C)  TempSrc: Oral   Weight is 143.4 pounds Physical Exam   in general this is a pleasant elderly distress sitting comfortably in his wheelchair.  Skin is warm and dry.  Eyes sclera and conjunctive are relatively clear I do not note any active drainage at this time.  Nose appears to have a small amount of possible clear drainage.  Oropharynx is clear mucous membranes moist I do not see any increased erythema.  Chest is largely clear to auscultation there are some slight bronchial sounds which clear with cough-.  Heart is regular rate and rhythm with a 2 out of 6 systolic murmur he does not have significant lower extremity edema.  Abdomen is soft nontender with positive bowel sounds.  Musculoskeletal moves all extremities x4 at baseline ambulates in a wheelchair strength appears to be intact all 4 extremities at baseline.  Neurologic is grossly intact his speech is clear no lateralizing findings cranial nerves intact.  Psych he is oriented to self pleasant appropriate nursing staff has not noted any behaviors Labs reviewed: Recent Labs    09/09/16 0626  10/18/16 0718  02/14/17 0800 02/22/17 1020 03/28/17 0630 04/11/17 0330  NA 142   < > 132*   < > 135 135 133* 136  K 3.4*   < > 5.5*   < > 4.3 4.8 4.4 4.5  CL 112*   < > 99*   < > 101 98* 103 104  CO2 26   < > 26   < > 27 26 25 25   GLUCOSE 113*   < > 124*   < > 131* 208* 87  105*  BUN 92*   < > 62*   < > 67* 67* 61* 57*  CREATININE 2.94*   < > 1.54*   < > 2.08* 2.32* 2.16* 2.05*  CALCIUM 7.6*   < > 9.6   < >  9.9  10.3* 9.6 9.4 9.4  MG 1.6*  --   --   --   --   --   --   --   PHOS 2.8   < > 4.3  --  3.3 3.2  --   --    < > = values in this interval not displayed.   Recent Labs    12/10/16 0437 12/11/16 0421 02/14/17 0800 02/22/17 1020  AST 56* 36  --  49*  ALT 55 54  --  57  ALKPHOS 55 41  --  83  BILITOT 0.6 0.7  --  0.8  PROT 6.4* 5.9*  --  8.0  ALBUMIN 2.3* 2.2* 3.5 3.5   Recent Labs    02/28/17 1014 03/28/17 1057 04/25/17 1036  WBC 2.6* 2.8* 2.5*  NEUTROABS 1.6* 1.7 1.3*  HGB 12.2* 13.0 12.3*  HCT 38.0* 39.7 40.2  MCV 92.7 92.3 94.1  PLT 124* 109* 127*   Lab Results  Component Value Date   TSH 3.661 02/15/2017   Lab Results  Component Value Date   HGBA1C 7.4 (H) 02/15/2017   Lab Results  Component Value Date   CHOL 82 02/15/2017   HDL 28 (L) 02/15/2017   LDLCALC 42 02/15/2017   TRIG 61 02/15/2017   CHOLHDL 2.9 02/15/2017    Significant Diagnostic Results in last 30 days:  No results found.  Assessment/Plan  #1 nasal congestion-cough- again x-ray was reassuring is not complaining of any shortness of breath chest congestion appeared to clear significantly with cough-will start Mucinex routinely 600 mg twice daily for 5 days-also add Flonase 1 spray each nostril twice daily for 5 days monitor-vital signs pulse ox every shift for 72 hours.  2.-  History of anemia of chronic disease with baseline stage III, chronic kidney disease.  This appears stable he did see hematology yesterday and Aranesp has been discontinued hemoglobin is stable at 12.3  He does have pancytopenia which appears stable as well with a white count of 2500 and platelets of 127,000  3.  History of chronic systolic and diastolic CHF  Is not complaining of any increased shortness of breath does not have increased edema has mild weight gain-- he continues on  Coreg as well as digoxin--chest x-ray did show increased interstitial edema This was discussed with Dr.Gupta via phone and will start low-dose Lasix 20 mg a day- will have to keep a close eye on his renal function with history of renal insufficiency- will update a BMP first laboratory data next week   #4 history of chronic kidney disease creatinine of 2.05 BUN of 57 appears to be relatively baseline will update this again next week secondary to Lasix being started    5  History of HIV is followed by infectious disease continues on Descovy and Tivicay--- this is been stable for some time.  [id number]

## 2017-05-01 ENCOUNTER — Encounter (HOSPITAL_COMMUNITY)
Admission: RE | Admit: 2017-05-01 | Discharge: 2017-05-01 | Disposition: A | Discharge status: Home / Self Care | Payer: Medicare HMO | Source: Skilled Nursing Facility | Attending: Internal Medicine | Admitting: Internal Medicine

## 2017-05-01 DIAGNOSIS — N3281 Overactive bladder: Secondary | ICD-10-CM | POA: Diagnosis not present

## 2017-05-01 DIAGNOSIS — I480 Paroxysmal atrial fibrillation: Secondary | ICD-10-CM | POA: Diagnosis not present

## 2017-05-01 DIAGNOSIS — I639 Cerebral infarction, unspecified: Secondary | ICD-10-CM | POA: Insufficient documentation

## 2017-05-01 DIAGNOSIS — Z9181 History of falling: Secondary | ICD-10-CM | POA: Insufficient documentation

## 2017-05-01 DIAGNOSIS — R1312 Dysphagia, oropharyngeal phase: Secondary | ICD-10-CM | POA: Diagnosis present

## 2017-05-01 DIAGNOSIS — I5022 Chronic systolic (congestive) heart failure: Secondary | ICD-10-CM | POA: Insufficient documentation

## 2017-05-01 DIAGNOSIS — I110 Hypertensive heart disease with heart failure: Secondary | ICD-10-CM | POA: Insufficient documentation

## 2017-05-01 LAB — BASIC METABOLIC PANEL
ANION GAP: 8 (ref 5–15)
BUN: 60 mg/dL — ABNORMAL HIGH (ref 6–20)
CHLORIDE: 104 mmol/L (ref 101–111)
CO2: 25 mmol/L (ref 22–32)
Calcium: 9.4 mg/dL (ref 8.9–10.3)
Creatinine, Ser: 2.19 mg/dL — ABNORMAL HIGH (ref 0.61–1.24)
GFR, EST AFRICAN AMERICAN: 35 mL/min — AB (ref >60)
GFR, EST NON AFRICAN AMERICAN: 30 mL/min — AB (ref >60)
Glucose, Bld: 106 mg/dL — ABNORMAL HIGH (ref 65–99)
POTASSIUM: 4.4 mmol/L (ref 3.5–5.1)
SODIUM: 137 mmol/L (ref 135–145)

## 2017-05-02 ENCOUNTER — Encounter: Payer: Self-pay | Admitting: Internal Medicine

## 2017-05-02 NOTE — Progress Notes (Signed)
Location:   [facility] Room Number: 148/D Place of Service:  SNF (31) Provider:  Quintella Baton, MD  Patient Care Team: Sinda Du, MD as PCP - General (Pulmonary Disease) Keith Romney Cristopher Estimable, MD as Consulting Physician (Gastroenterology)  Extended Emergency Contact Information Primary Emergency Contact: [facility] Address: [address] [place] [address] [place] Phone: [phone] Relation: Relative Secondary Emergency Contact: Keith Young Address: [address] [place] [address] [place] Phone: [phone] Relation: Sister  Code Status:  DNR Goals of care: Advanced Directive information Advanced Directives 05/02/2017  Does Patient Have a Medical Advance Directive? Yes  Type of Advance Directive Out of facility DNR (pink MOST or yellow form)  Does patient want to make changes to medical advance directive? No - Patient declined  Copy of [place] in Chart? No - copy requested  Would patient like information on creating a medical advance directive? -  Pre-existing out of facility DNR order (yellow form or pink MOST form) -     Chief Complaint  Patient presents with  . Medical Management of Chronic Issues    Routine Visit, Due DM Eye, Foot ,Albumin    HPI:  Pt is a 66 y.o. Young seen today for medical management of chronic diseases.     Past Medical History:  Diagnosis Date  . AICD (automatic cardioverter/defibrillator) present   . Alcoholism in remission ([place])   . Anemia   . Aortic insufficiency   . Arthritis   . At moderate risk for fall   . Bell's palsy   . Cardiomyopathy ([place])   . Chronic systolic heart failure (HCC)    NYHA Class III  . CKD (chronic kidney disease) stage 3, GFR 30-[place] ml/min (HCC)   . COPD (chronic obstructive pulmonary disease) ([place])   . Essential hypertension, benign   . HIV disease ([place])  09/06/2016  . Hyperlipidemia   . Noncompliance with medication regimen   . Nonischemic cardiomyopathy (HCC)    EF 15-20%  . NSVT (nonsustained ventricular tachycardia) ([place])   . Numbness of right jaw    Had a stoke in 01/2013. Numbness is occasional, especially when trying to chew.  . Productive cough 08/2013   With brown sputum   . SOB (shortness of breath) on exertion 08/2013  . Stroke ([place]) 01/2013   weakness of right side from CVA  . Type 2 diabetes mellitus ([place])   . Uses hearing aid 2014   recently received new hearing aids   Past Surgical History:  Procedure Laterality Date  . BIOPSY N/A 04/10/2014   Procedure: GASTRIC BIOPSY;  Surgeon: Daneil Dolin, MD;  Location: [facility] ORS;  Service: Endoscopy;  Laterality: N/A;  . BIOPSY  10/12/2015   Procedure: BIOPSY;  Surgeon: Daneil Dolin, MD;  Location[facility] ENDO SUITE;  Service: Endoscopy;;  stomach bx's  . CARDIAC DEFIBRILLATOR PLACEMENT  11/12/12   [employer] Inogen MINI ICD implanted in [place] at [facility] per Dr Nelly Laurence note  . COLONOSCOPY WITH PROPOFOL N/A 04/10/2014   RMR: Colonic Diverticulosis  . ESOPHAGOGASTRODUODENOSCOPY (EGD) WITH PROPOFOL N/A 04/10/2014   RMR: Mild erosive reflux esophagitis. Multiple antral polyps likely hyperplastic status post removal by hot snare cautery technique. Diffusely abnormal stomach status post gastric biopsy I suspect some  of patients anemaia may be due to intermittent oozing from the stomach. It would be difficult and a risky proposition to attempt complete removal of all of his gastric polyps.  . ESOPHAGOGASTRODUODENOSCOPY (EGD) WITH PROPOFOL N/A 10/12/2015   Procedure: ESOPHAGOGASTRODUODENOSCOPY (EGD) WITH PROPOFOL;  Surgeon: Daneil Dolin, MD;  Location[facility] ENDO SU[facility];  Service: Endoscopy;  Laterality: N/A;  7846 - moved to 9:15  . POLYPECTOMY N/A 04/10/2014   Procedure: GASTRIC POLYPECTOMY;  Surgeon: Daneil Dolin, MD;  Location:  [facility] ORS;  Service: Endoscopy;  Laterality: N/A;  . RIGHT HEART CATHETERIZATION N/A 02/06/2014   Procedure: RIGHT HEART CATH;  Surgeon: Larey Dresser, MD;  Location: [facility] CATH LAB;  Service: Cardiovascular;  Laterality: N/A;    No Known Allergies  Outpatient Encounter Medications as of 05/02/2017  Medication Sig  . acetaminophen (TYLENOL) 325 MG tablet Take 2 tablets (650 mg total) by mouth every 4 (four) hours as needed for mild pain (or temp > 37.5 C (99.5 F)).  Marland Kitchen aspirin 325 MG tablet Take 1 tablet (325 mg total) by mouth daily.  Marland Kitchen atorvastatin (LIPITOR) 80 MG tablet Take 80 mg by mouth daily.   Marland Kitchen atovaquone (MEPRON) 750 MG/5ML suspension Take 10 mLs (1,500 mg total) by mouth daily with breakfast.  . carvedilol (COREG) 3.125 MG tablet Take 1 tablet (3.125 mg total) by mouth 2 (two) times daily with a meal.  . Cholecalciferol (VITAMIN D3) 2000 UNITS TABS Take 2,000 mg by mouth daily.  . digoxin (LANOXIN) 0.125 MG tablet TAKE 1/2 TABLET BY MOUTH DAILY  . dolutegravir (TIVICAY) 50 MG tablet Take 1 tablet (50 mg total) by mouth daily.  Marland Kitchen emtricitabine-tenofovir AF (DESCOVY) 200-25 MG tablet Take 1 tablet by mouth daily.  . ferrous sulfate 325 (65 FE) MG tablet Take 325 mg by mouth daily with breakfast.  . furosemide (LASIX) 20 MG tablet Take 20 mg by mouth daily.  . isosorbide dinitrate (ISORDIL) 10 MG tablet Take 10 mg by mouth 3 (three) times daily.   . mirabegron ER (MYRBETRIQ) 25 MG TB24 tablet Take 25 mg by mouth daily.  . Multiple Vitamin (MULTIVITAMIN WITH MINERALS) TABS tablet Take 1 tablet by mouth daily.  . pantoprazole (PROTONIX) 40 MG tablet TAKE ONE TABLET   BY MOUTH   DAILY   No facility-administered encounter medications on file as of 05/02/2017.      Review of Systems  Immunization History  Administered Date(s) Administered  . Hepatitis A, Adult 09/19/2016  . Hepatitis B, adult 09/19/2016  . Influenza-Unspecified 03/13/2015, 07/07/2016, 03/03/2017  . Pneumococcal  Conjugate-13 03/03/2017  . Pneumococcal Polysaccharide-23 07/08/2016, 09/19/2016  . Tdap 02/23/2017   Pertinent  Health Maintenance Due  Topic Date Due  . FOOT EXAM  06/28/2017 (Originally 02/10/1961)  . OPHTHALMOLOGY EXAM  06/28/2017 (Originally 02/10/1961)  . URINE MICROALBUMIN  06/28/2017 (Originally 02/10/1961)  . HEMOGLOBIN A1C  08/15/2017  . PNA vac Low Risk Adult (2 of 2 - PPSV23) 09/19/2021  . COLONOSCOPY  04/10/2024   Fall Risk  04/18/2017 02/23/2017 11/29/2016 11/29/2016  Falls in the past year? No No No No   Functional Status Survey:    Vitals:   05/02/17 0936  BP: 112/76  Pulse: 70  Resp: 18  Temp: 98.2 F (36.8 C)  TempSrc: Oral  SpO2: 94%  Weight: 143 lb 6.4 oz (65 kg)  Height: 5\' 3"  (1.6 m)   Body mass index is 25.4 kg/m. Physical Exam  Labs reviewed: Recent Labs    09/09/16 [id number]  10/18/16 0718  02/14/17 0800 02/22/17 1020 03/28/17 0630 04/11/17 0330 05/01/17 0200  NA 142   < > 132*   < > 135 135 133* 136 137  K 3.4*   < > 5.5*   < > 4.3 4.8 4.4 4.5 4.4  CL 112*   < > 99*   < > 101 98* 103 104 104  CO2 26   < > 26   < > 27 26 25 25 25   GLUCOSE 113*   < > 124*   < > 131* 208* 87 105* 106*  BUN 92*   < > 62*   < > 67* 67* 61* 57* 60*  CREATININE 2.94*   < > 1.54*   < > 2.08* 2.32* 2.16* 2.05* 2.19*  CALCIUM 7.6*   < > 9.6   < > 9.9  10.3* 9.6 9.4 9.4 9.4  MG 1.6*  --   --   --   --   --   --   --   --   PHOS 2.8   < > 4.3  --  3.3 3.2  --   --   --    < > = values in this interval not displayed.   Recent Labs    12/10/16 0437 12/11/16 0421 02/14/17 0800 02/22/17 1020  AST 56* 36  --  49*  ALT 55 54  --  57  ALKPHOS 55 41  --  83  BILITOT 0.6 0.7  --  0.8  PROT 6.4* 5.9*  --  8.0  ALBUMIN 2.3* 2.2* 3.5 3.5   Recent Labs    02/28/17 1014 03/28/17 1057 04/25/17 1036  WBC 2.6* 2.8* 2.5*  NEUTROABS 1.6* 1.7 1.3*  HGB 12.2* 13.0 12.3*  HCT 38.0* 39.7 40.2  MCV 92.7 92.3 94.1  PLT 124* 109* 127*   Lab Results  Component Value Date    TSH 3.661 02/15/2017   Lab Results  Component Value Date   HGBA1C 7.4 (H) 02/15/2017   Lab Results  Component Value Date   CHOL 82 02/15/2017   HDL 28 (L) 02/15/2017   LDLCALC 42 02/15/2017   TRIG 61 02/15/2017   CHOLHDL 2.9 02/15/2017    Significant Diagnostic Results in last 30 days:  No results found.  Assessment/Plan There are no diagnoses linked to this encounter.   Family/ staff Communication:   Labs/tests ordered:      This encounter was created in error - please disregard.

## 2017-05-11 ENCOUNTER — Encounter: Payer: Self-pay | Admitting: Internal Medicine

## 2017-05-11 ENCOUNTER — Non-Acute Institutional Stay (SKILLED_NURSING_FACILITY): Payer: Medicare HMO | Admitting: Internal Medicine

## 2017-05-11 DIAGNOSIS — N183 Chronic kidney disease, stage 3 unspecified: Secondary | ICD-10-CM

## 2017-05-11 DIAGNOSIS — I10 Essential (primary) hypertension: Secondary | ICD-10-CM

## 2017-05-11 DIAGNOSIS — I639 Cerebral infarction, unspecified: Secondary | ICD-10-CM

## 2017-05-11 DIAGNOSIS — I5042 Chronic combined systolic (congestive) and diastolic (congestive) heart failure: Secondary | ICD-10-CM

## 2017-05-11 DIAGNOSIS — E119 Type 2 diabetes mellitus without complications: Secondary | ICD-10-CM

## 2017-05-11 NOTE — Progress Notes (Signed)
Location:   [place] Room Number: 148/D Place of Service:  SNF (31) Provider:  Quintella Baton, MD  Patient Care Team: Sinda Du, MD as PCP - General (Pulmonary Disease) Gala Romney Cristopher Estimable, MD as Consulting Physician (Gastroenterology)  Extended Emergency Contact Information Primary Emergency Contact: [facility] Address: [address], [place] [address] [place] Phone: [phone] Relation: Relative Secondary Emergency Contact: [facility] Address: [address] [place] [address] [place] Phone: [phone] Relation: Sister  Code Status:  DNR Goals of care: Advanced Directive information Advanced Directives 05/11/2017  Does Patient Have a Medical Advance Directive? Yes  Type of Advance Directive Out of facility DNR (pink MOST or yellow form)  Does patient want to make changes to medical advance directive? No - Patient declined  Copy of [place] in Chart? No - copy requested  Would patient like information on creating a medical advance directive? -  Pre-existing out of facility DNR order (yellow form or pink MOST form) -     Chief Complaint  Patient presents with  . Medical Management of Chronic Issues    Routine Visit    HPI:  Pt is a 66 y.o. male seen today for medical management of chronic diseases.   He has h/o  Chronic CHF with Cardiomyopathy EF of 30% in 07/18, CKD Stage 3, HIV on treatment with Infectious disease. S/P AICD, hypertension, S/P CVA, Chronic Anemia, Has Chronic Foley Cathter. Hyperlipidemia, Diabetes Mellitus, COPD Patient is doing well.  He was seen a few weeks ago for cough and his chest x-ray showed some mild pulmonary congestion.  He was started on Lasix 20 mg daily as he has a history of CHF with EF of 30%. Patient says he does not have any cough or shortness of breath today. Ihe had gained almost 10  pounds.  to 142 pounds.  His weight has started to come down to 139 lbs He was also seen by infectious disease and hematology. No changes were made to his medications Patient had no new complaints today and is not having any nursing issues. Past Medical History:  Diagnosis Date  . AICD (automatic cardioverter/defibrillator) present   . Alcoholism in remission ([place])   . Anemia   . Aortic insufficiency   . Arthritis   . At moderate risk for fall   . Bell's palsy   . Cardiomyopathy ([place])   . Chronic systolic heart failure (HCC)    NYHA Class III  . CKD (chronic kidney disease) stage 3, GFR 30-59 ml/min (HCC)   . COPD (chronic obstructive pulmonary disease) ([place])   . Essential hypertension, benign   . HIV disease ([place]) 09/06/2016  . Hyperlipidemia   . Noncompliance with medication regimen   . Nonischemic cardiomyopathy (HCC)    EF 15-20%  . NSVT (nonsustained ventricular tachycardia) ([place])   . Numbness of right jaw    Had a stoke in 01/2013. Numbness is occasional, especially when trying to chew.  . Productive cough 08/2013   With brown sputum   . SOB (shortness of breath) on exertion 08/2013  . Stroke ([place]) 01/2013   weakness of right side from CVA  . Type 2 diabetes mellitus ([place])   . Uses hearing aid 2014   recently received new hearing aids   Past Surgical History:  Procedure Laterality Date  .  BIOPSY N/A 04/10/2014   Procedure: GASTRIC BIOPSY;  Surgeon: Daneil Dolin, MD;  Location: [facility] ORS;  Service: Endoscopy;  Laterality: N/A;  . BIOPSY  10/12/2015   Procedure: BIOPSY;  Surgeon: Daneil Dolin, MD;  Location: [facility] ENDO SUITE;  Service: Endoscopy;;  stomach bx's  . CARDIAC DEFIBRILLATOR PLACEMENT  11/12/12   [employer] Inogen MINI ICD implanted in [place] at [place] [facility] per Dr Nelly Laurence note  . COLONOSCOPY WITH PROPOFOL N/A 04/10/2014   RMR: Colonic Diverticulosis  . ESOPHAGOGASTRODUODENOSCOPY (EGD) WITH PROPOFOL N/A  04/10/2014   RMR: Mild erosive reflux esophagitis. Multiple antral polyps likely hyperplastic status post removal by hot snare cautery technique. Diffusely abnormal stomach status post gastric biopsy I suspect some of patients anemaia may be due to intermittent oozing from the stomach. It would be difficult and a risky proposition to attempt complete removal of all of his gastric polyps.  . ESOPHAGOGASTRODUODENOSCOPY (EGD) WITH PROPOFOL N/A 10/12/2015   Procedure: ESOPHAGOGASTRODUODENOSCOPY (EGD) WITH PROPOFOL;  Surgeon: Daneil Dolin, MD;  Location[facility];  Service: Endoscopy;  Laterality: N/A;  6256 - moved to 9:15  . POLYPECTOMY N/A 04/10/2014   Procedure: GASTRIC POLYPECTOMY;  Surgeon: Daneil Dolin, MD;  Location: [facility] ORS;  Service: Endoscopy;  Laterality: N/A;  . RIGHT HEART CATHETERIZATION N/A 02/06/2014   Procedure: RIGHT HEART CATH;  Surgeon: Larey Dresser, MD;  Location: [facility] CATH LAB;  Service: Cardiovascular;  Laterality: N/A;    No Known Allergies  Outpatient Encounter Medications as of 05/11/2017  Medication Sig  . acetaminophen (TYLENOL) 325 MG tablet Take 2 tablets (650 mg total) by mouth every 4 (four) hours as needed for mild pain (or temp > 37.5 C (99.5 F)).  Marland Kitchen aspirin 325 MG tablet Take 1 tablet (325 mg total) by mouth daily.  Marland Kitchen atorvastatin (LIPITOR) 80 MG tablet Take 80 mg by mouth daily.   Marland Kitchen atovaquone (MEPRON) 750 MG/5ML suspension Take 10 mLs (1,500 mg total) by mouth daily with breakfast.  . carvedilol (COREG) 3.125 MG tablet Take 1 tablet (3.125 mg total) by mouth 2 (two) times daily with a meal.  . Cholecalciferol (VITAMIN D3) 2000 UNITS TABS Take 2,000 mg by mouth daily.  . digoxin (LANOXIN) 0.125 MG tablet TAKE 1/2 TABLET BY MOUTH DAILY  . dolutegravir (TIVICAY) 50 MG tablet Take 1 tablet (50 mg total) by mouth daily.  Marland Kitchen emtricitabine-tenofovir AF (DESCOVY) 200-25 MG tablet Take 1 tablet by mouth daily.  . ferrous sulfate 325 (65 FE) MG tablet Take 325 mg  by mouth daily with breakfast.  . furosemide (LASIX) 20 MG tablet Take 20 mg by mouth daily.  . isosorbide dinitrate (ISORDIL) 10 MG tablet Take 10 mg by mouth 3 (three) times daily.   . mirabegron ER (MYRBETRIQ) 25 MG TB24 tablet Take 25 mg by mouth daily.  . Multiple Vitamin (MULTIVITAMIN WITH MINERALS) TABS tablet Take 1 tablet by mouth daily.  . pantoprazole (PROTONIX) 40 MG tablet TAKE ONE TABLET   BY MOUTH   DAILY   No facility-administered encounter medications on file as of 05/11/2017.      Review of Systems  Review of Systems  Constitutional: Negative for activity change, appetite change, chills, diaphoresis, fatigue and fever.  HENT: Negative for mouth sores, postnasal drip, rhinorrhea, sinus pain and sore throat.   Respiratory: Negative for apnea, cough, chest tightness, shortness of breath and wheezing.   Cardiovascular: Negative for chest pain, palpitations  and leg swelling.  Gastrointestinal: Negative for abdominal distention, abdominal pain, constipation, diarrhea, nausea and vomiting.  Genitourinary: Negative for dysuria and frequency.  Musculoskeletal: Negative for arthralgias, joint swelling and myalgias.  Skin: Negative for rash.  Neurological: Negative for dizziness, syncope, weakness, light-headedness and numbness.  Psychiatric/Behavioral: Negative for behavioral problems, confusion and sleep disturbance.     Immunization History  Administered Date(s) Administered  . Hepatitis A, Adult 09/19/2016  . Hepatitis B, adult 09/19/2016  . Influenza-Unspecified 03/13/2015, 07/07/2016, 03/03/2017  . Pneumococcal Conjugate-13 03/03/2017  . Pneumococcal Polysaccharide-23 07/08/2016, 09/19/2016  . Tdap 02/23/2017   Pertinent  Health Maintenance Due  Topic Date Due  . FOOT EXAM  06/28/2017 (Originally 02/10/1961)  . OPHTHALMOLOGY EXAM  06/28/2017 (Originally 02/10/1961)  . URINE MICROALBUMIN  06/28/2017 (Originally 02/10/1961)  . HEMOGLOBIN A1C  08/15/2017  . PNA vac Low  Risk Adult (2 of 2 - PPSV23) 09/19/2021  . COLONOSCOPY  04/10/2024   Fall Risk  04/18/2017 02/23/2017 11/29/2016 11/29/2016  Falls in the past year? No No No No   Functional Status Survey:    Vitals:   05/11/17 0919  BP: 119/79  Pulse: 82  Resp: 18  Temp: 98.2 F (36.8 C)  TempSrc: Oral  SpO2: 94%  Weight: 139 lb 12.8 oz (63.4 kg)  Height: 5\' 3"  (1.6 m)   Body mass index is 24.76 kg/m. Physical Exam  Constitutional: He appears well-developed and well-nourished.  HENT:  Head: Normocephalic.  Mouth/Throat: Oropharynx is clear and moist.  Eyes: Pupils are equal, round, and reactive to light.  Neck: Neck supple.  Cardiovascular: Normal rate.  Murmur heard. Pulmonary/Chest: Effort normal and breath sounds normal.  Few rales in Left lower base  Abdominal: Soft. Bowel sounds are normal. He exhibits no distension. There is no tenderness. There is no rebound.  Musculoskeletal: He exhibits no edema.  Lymphadenopathy:    He has no cervical adenopathy.  Neurological: He is alert.    Labs reviewed: Recent Labs    09/09/16 0626  10/18/16 0718  02/14/17 0800 02/22/17 1020 03/28/17 0630 04/11/17 0330 05/01/17 0200  NA 142   < > 132*   < > 135 135 133* 136 137  K 3.4*   < > 5.5*   < > 4.3 4.8 4.4 4.5 4.4  CL 112*   < > 99*   < > 101 98* 103 104 104  CO2 26   < > 26   < > 27 26 25 25 25   GLUCOSE 113*   < > 124*   < > 131* 208* 87 105* 106*  BUN 92*   < > 62*   < > 67* 67* 61* 57* 60*  CREATININE 2.94*   < > 1.54*   < > 2.08* 2.32* 2.16* 2.05* 2.19*  CALCIUM 7.6*   < > 9.6   < > 9.9  10.3* 9.6 9.4 9.4 9.4  MG 1.6*  --   --   --   --   --   --   --   --   PHOS 2.8   < > 4.3  --  3.3 3.2  --   --   --    < > = values in this interval not displayed.   Recent Labs    12/10/16 0437 12/11/16 0421 02/14/17 0800 02/22/17 1020  AST 56* 36  --  49*  ALT 55 54  --  57  ALKPHOS 55 41  --  83  BILITOT 0.6 0.7  --  0.8  PROT 6.4* 5.9*  --  8.0  ALBUMIN 2.3* 2.2* 3.5 3.5    Recent Labs    02/28/17 1014 03/28/17 1057 04/25/17 1036  WBC 2.6* 2.8* 2.5*  NEUTROABS 1.6* 1.7 1.3*  HGB 12.2* 13.0 12.3*  HCT 38.0* 39.7 40.2  MCV 92.7 92.3 94.1  PLT 124* 109* 127*   Lab Results  Component Value Date   TSH 3.661 02/15/2017   Lab Results  Component Value Date   HGBA1C 7.4 (H) 02/15/2017   Lab Results  Component Value Date   CHOL 82 02/15/2017   HDL 28 (L) 02/15/2017   LDLCALC 42 02/15/2017   TRIG 61 02/15/2017   CHOLHDL 2.9 02/15/2017    Significant Diagnostic Results in last 30 days:  No results found.  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 Is on Lasix , digoxin nitrate and Coreg. he is also s/p AICD placement . Will continue to follow Weights. Repeat BMP Essential hypertension, BP Controlled Diabetes mellitus type 2  BS are less then 200 mostly with few spikes of 250 Will check A1C Was 7.4 in 09/18 We will repeat A1c  HIV disease  Follows with Infectious disease. Per their Notes his Viral Load is suppressed No Changes were made to his antiviral medications  History of stroke Doing well with supportive care. 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  Chronic i renal Disease Some worsening of renal function Has f/u with Nephrology.  Chronic Foley cathter Per nurses he has h/o retention Do not see if he has followed with Urology We are trying to see if he can get appointment with Urology  Hyperlipidemia On Lipitor LDL 42 in 09/18 Family/ staff Communication:   Labs/tests ordered:

## 2017-05-15 ENCOUNTER — Encounter (HOSPITAL_COMMUNITY)
Admission: RE | Admit: 2017-05-15 | Discharge: 2017-05-15 | Disposition: A | Discharge status: Home / Self Care | Payer: Medicare HMO | Source: Skilled Nursing Facility | Attending: *Deleted | Admitting: *Deleted

## 2017-05-15 DIAGNOSIS — I110 Hypertensive heart disease with heart failure: Secondary | ICD-10-CM | POA: Diagnosis not present

## 2017-05-15 LAB — BASIC METABOLIC PANEL
Anion gap: 6 (ref 5–15)
BUN: 69 mg/dL — AB (ref 6–20)
CHLORIDE: 102 mmol/L (ref 101–111)
CO2: 29 mmol/L (ref 22–32)
Calcium: 9.6 mg/dL (ref 8.9–10.3)
Creatinine, Ser: 2.29 mg/dL — ABNORMAL HIGH (ref 0.61–1.24)
GFR calc Af Amer: 33 mL/min — ABNORMAL LOW (ref >60)
GFR, EST NON AFRICAN AMERICAN: 28 mL/min — AB (ref >60)
GLUCOSE: 149 mg/dL — AB (ref 65–99)
POTASSIUM: 4.1 mmol/L (ref 3.5–5.1)
Sodium: 137 mmol/L (ref 135–145)

## 2017-05-15 LAB — CBC
HCT: 38.9 % — ABNORMAL LOW (ref 39.0–52.0)
Hemoglobin: 12.1 g/dL — ABNORMAL LOW (ref 13.0–17.0)
MCH: 28.9 pg (ref 26.0–34.0)
MCHC: 31.1 g/dL (ref 30.0–36.0)
MCV: 92.8 fL (ref 78.0–100.0)
PLATELETS: 125 10*3/uL — AB (ref 150–400)
RBC: 4.19 MIL/uL — AB (ref 4.22–5.81)
RDW: 13.3 % (ref 11.5–15.5)
WBC: 2.5 10*3/uL — ABNORMAL LOW (ref 4.0–10.5)

## 2017-05-24 ENCOUNTER — Encounter (HOSPITAL_COMMUNITY)
Admission: RE | Admit: 2017-05-24 | Discharge: 2017-05-24 | Disposition: A | Discharge status: Home / Self Care | Payer: Medicare HMO | Source: Skilled Nursing Facility | Attending: *Deleted | Admitting: *Deleted

## 2017-05-24 DIAGNOSIS — I110 Hypertensive heart disease with heart failure: Secondary | ICD-10-CM | POA: Diagnosis not present

## 2017-05-24 LAB — HEMOGLOBIN A1C
Hgb A1c MFr Bld: 7.2 % — ABNORMAL HIGH (ref 4.8–5.6)
Mean Plasma Glucose: 159.94 mg/dL

## 2017-05-24 LAB — BASIC METABOLIC PANEL
Anion gap: 11 (ref 5–15)
BUN: 49 mg/dL — AB (ref 6–20)
CALCIUM: 9.4 mg/dL (ref 8.9–10.3)
CO2: 23 mmol/L (ref 22–32)
CREATININE: 1.87 mg/dL — AB (ref 0.61–1.24)
Chloride: 102 mmol/L (ref 101–111)
GFR calc Af Amer: 42 mL/min — ABNORMAL LOW (ref >60)
GFR, EST NON AFRICAN AMERICAN: 36 mL/min — AB (ref >60)
Glucose, Bld: 104 mg/dL — ABNORMAL HIGH (ref 65–99)
Potassium: 4.4 mmol/L (ref 3.5–5.1)
SODIUM: 136 mmol/L (ref 135–145)

## 2017-05-29 ENCOUNTER — Encounter (HOSPITAL_COMMUNITY)
Admission: RE | Admit: 2017-05-29 | Discharge: 2017-05-29 | Disposition: A | Discharge status: Home / Self Care | Payer: Medicare HMO | Source: Skilled Nursing Facility | Attending: *Deleted | Admitting: *Deleted

## 2017-05-29 DIAGNOSIS — I110 Hypertensive heart disease with heart failure: Secondary | ICD-10-CM | POA: Diagnosis not present

## 2017-05-29 LAB — CBC WITH DIFFERENTIAL/PLATELET
BASOS PCT: 0 %
Basophils Absolute: 0 10*3/uL (ref 0.0–0.1)
EOS ABS: 0.1 10*3/uL (ref 0.0–0.7)
Eosinophils Relative: 5 %
HCT: 40.7 % (ref 39.0–52.0)
HEMOGLOBIN: 12.9 g/dL — AB (ref 13.0–17.0)
Lymphocytes Relative: 21 %
Lymphs Abs: 0.6 10*3/uL — ABNORMAL LOW (ref 0.7–4.0)
MCH: 29.6 pg (ref 26.0–34.0)
MCHC: 31.7 g/dL (ref 30.0–36.0)
MCV: 93.3 fL (ref 78.0–100.0)
Monocytes Absolute: 0.5 10*3/uL (ref 0.1–1.0)
Monocytes Relative: 19 %
NEUTROS PCT: 55 %
Neutro Abs: 1.5 10*3/uL — ABNORMAL LOW (ref 1.7–7.7)
Platelets: 135 10*3/uL — ABNORMAL LOW (ref 150–400)
RBC: 4.36 MIL/uL (ref 4.22–5.81)
RDW: 13.5 % (ref 11.5–15.5)
WBC: 2.7 10*3/uL — AB (ref 4.0–10.5)

## 2017-05-29 LAB — BASIC METABOLIC PANEL
Anion gap: 9 (ref 5–15)
BUN: 59 mg/dL — ABNORMAL HIGH (ref 6–20)
CALCIUM: 9.6 mg/dL (ref 8.9–10.3)
CO2: 26 mmol/L (ref 22–32)
CREATININE: 2.2 mg/dL — AB (ref 0.61–1.24)
Chloride: 102 mmol/L (ref 101–111)
GFR, EST AFRICAN AMERICAN: 34 mL/min — AB (ref >60)
GFR, EST NON AFRICAN AMERICAN: 30 mL/min — AB (ref >60)
Glucose, Bld: 111 mg/dL — ABNORMAL HIGH (ref 65–99)
Potassium: 4.6 mmol/L (ref 3.5–5.1)
SODIUM: 137 mmol/L (ref 135–145)

## 2017-05-31 ENCOUNTER — Encounter (HOSPITAL_COMMUNITY)
Admission: RE | Admit: 2017-05-31 | Discharge: 2017-05-31 | Disposition: A | Discharge status: Home / Self Care | Payer: Medicare HMO | Source: Skilled Nursing Facility | Attending: Internal Medicine | Admitting: Internal Medicine

## 2017-05-31 DIAGNOSIS — I5022 Chronic systolic (congestive) heart failure: Secondary | ICD-10-CM | POA: Diagnosis present

## 2017-05-31 DIAGNOSIS — Z9181 History of falling: Secondary | ICD-10-CM | POA: Diagnosis not present

## 2017-05-31 DIAGNOSIS — N3281 Overactive bladder: Secondary | ICD-10-CM | POA: Insufficient documentation

## 2017-05-31 DIAGNOSIS — I639 Cerebral infarction, unspecified: Secondary | ICD-10-CM | POA: Diagnosis present

## 2017-05-31 DIAGNOSIS — I480 Paroxysmal atrial fibrillation: Secondary | ICD-10-CM | POA: Insufficient documentation

## 2017-05-31 DIAGNOSIS — I110 Hypertensive heart disease with heart failure: Secondary | ICD-10-CM | POA: Insufficient documentation

## 2017-05-31 DIAGNOSIS — R1312 Dysphagia, oropharyngeal phase: Secondary | ICD-10-CM | POA: Diagnosis present

## 2017-05-31 LAB — BASIC METABOLIC PANEL
Anion gap: 10 (ref 5–15)
BUN: 56 mg/dL — ABNORMAL HIGH (ref 6–20)
CO2: 25 mmol/L (ref 22–32)
Calcium: 9.7 mg/dL (ref 8.9–10.3)
Chloride: 100 mmol/L — ABNORMAL LOW (ref 101–111)
Creatinine, Ser: 2.32 mg/dL — ABNORMAL HIGH (ref 0.61–1.24)
GFR calc non Af Amer: 28 mL/min — ABNORMAL LOW (ref >60)
GFR, EST AFRICAN AMERICAN: 32 mL/min — AB (ref >60)
Glucose, Bld: 125 mg/dL — ABNORMAL HIGH (ref 65–99)
Potassium: 4.4 mmol/L (ref 3.5–5.1)
SODIUM: 135 mmol/L (ref 135–145)

## 2017-06-05 ENCOUNTER — Encounter (HOSPITAL_COMMUNITY)
Admission: RE | Admit: 2017-06-05 | Discharge: 2017-06-05 | Disposition: A | Discharge status: Home / Self Care | Payer: Medicare HMO | Source: Skilled Nursing Facility | Attending: *Deleted | Admitting: *Deleted

## 2017-06-05 DIAGNOSIS — I110 Hypertensive heart disease with heart failure: Secondary | ICD-10-CM | POA: Diagnosis not present

## 2017-06-05 LAB — CBC WITH DIFFERENTIAL/PLATELET
BASOS ABS: 0 10*3/uL (ref 0.0–0.1)
Basophils Relative: 0 %
EOS ABS: 0.2 10*3/uL (ref 0.0–0.7)
EOS PCT: 6 %
HCT: 41 % (ref 39.0–52.0)
Hemoglobin: 13.1 g/dL (ref 13.0–17.0)
LYMPHS ABS: 0.5 10*3/uL — AB (ref 0.7–4.0)
Lymphocytes Relative: 18 %
MCH: 29.6 pg (ref 26.0–34.0)
MCHC: 32 g/dL (ref 30.0–36.0)
MCV: 92.6 fL (ref 78.0–100.0)
Monocytes Absolute: 0.5 10*3/uL (ref 0.1–1.0)
Monocytes Relative: 17 %
Neutro Abs: 1.7 10*3/uL (ref 1.7–7.7)
Neutrophils Relative %: 59 %
PLATELETS: 135 10*3/uL — AB (ref 150–400)
RBC: 4.43 MIL/uL (ref 4.22–5.81)
RDW: 13.2 % (ref 11.5–15.5)
WBC: 3 10*3/uL — ABNORMAL LOW (ref 4.0–10.5)

## 2017-06-05 LAB — BASIC METABOLIC PANEL
Anion gap: 10 (ref 5–15)
BUN: 56 mg/dL — ABNORMAL HIGH (ref 6–20)
CO2: 25 mmol/L (ref 22–32)
Calcium: 9.5 mg/dL (ref 8.9–10.3)
Chloride: 101 mmol/L (ref 101–111)
Creatinine, Ser: 2.32 mg/dL — ABNORMAL HIGH (ref 0.61–1.24)
GFR calc Af Amer: 32 mL/min — ABNORMAL LOW (ref >60)
GFR calc non Af Amer: 28 mL/min — ABNORMAL LOW (ref >60)
Glucose, Bld: 117 mg/dL — ABNORMAL HIGH (ref 65–99)
Potassium: 4.3 mmol/L (ref 3.5–5.1)
SODIUM: 136 mmol/L (ref 135–145)

## 2017-06-12 ENCOUNTER — Encounter: Payer: Self-pay | Admitting: Internal Medicine

## 2017-06-12 ENCOUNTER — Non-Acute Institutional Stay (SKILLED_NURSING_FACILITY): Payer: Medicare HMO | Admitting: Internal Medicine

## 2017-06-12 DIAGNOSIS — N183 Chronic kidney disease, stage 3 unspecified: Secondary | ICD-10-CM

## 2017-06-12 DIAGNOSIS — D631 Anemia in chronic kidney disease: Secondary | ICD-10-CM

## 2017-06-12 DIAGNOSIS — E119 Type 2 diabetes mellitus without complications: Secondary | ICD-10-CM

## 2017-06-12 DIAGNOSIS — B20 Human immunodeficiency virus [HIV] disease: Secondary | ICD-10-CM | POA: Diagnosis not present

## 2017-06-12 DIAGNOSIS — I5042 Chronic combined systolic (congestive) and diastolic (congestive) heart failure: Secondary | ICD-10-CM

## 2017-06-12 DIAGNOSIS — N189 Chronic kidney disease, unspecified: Secondary | ICD-10-CM | POA: Diagnosis not present

## 2017-06-12 DIAGNOSIS — I10 Essential (primary) hypertension: Secondary | ICD-10-CM

## 2017-06-12 NOTE — Progress Notes (Signed)
Location:   [facility] Room Number: 148/D Place of Service:  SNF (31) Provider:  Terisa Starr, MD  Patient Care Team: Sinda Du, MD as PCP - General (Pulmonary Disease) Gala Romney Cristopher Estimable, MD as Consulting Physician (Gastroenterology)  Extended Emergency Contact Information Primary Emergency Contact: [facility] Address: [address], [place] [address] [place] Phone: [phone] Relation: Relative Secondary Emergency Contact: [facility] Address: [address] [place] [address] [place] Phone: [phone] Relation: Sister  Code Status:  DNR Goals of care: Advanced Directive information Advanced Directives 06/12/2017  Does Patient Have a Medical Advance Directive? Yes  Type of Advance Directive Out of facility DNR (pink MOST or yellow form)  Does patient want to make changes to medical advance directive? No - Patient declined  Copy of [place] in Chart? No - copy requested  Would patient like information on creating a medical advance directive? -  Pre-existing out of facility DNR order (yellow form or pink MOST form) -     Chief Complaint  Patient presents with  . Medical Management of Chronic Issues    Routine Visit  For medical management of chronic medical conditions including CHF with ejection fraction of 30% per echo in July 2018- hypertension history CVA anemia of chronic kidney disease stage III-as well as HIV followed by infectious disease and currently on treatment.  Also a history of hyperlipidemia diabetes 2 COPD and chronic Foley catheter.    HPI:  Keith Young is a 67 y.o. male seen today for medical management of chronic diseases.  --- Despite his multiple diagnoses nursing staff feels he is doing quite well.  He does not have any complaints today vital signs appear to be stable-weight appears relatively stable at 142  pounds-.  He was seen late last year for cough and chest x-ray did show some mild pulmonary congestion he was started on Lasix 20 mg  a day which he appears to be tolerating fairly well-again his weight has been relatively stable at 142 there has not been a precipitous weight gain.--He is not complaining of any shortness of breath or chest pain does not really have significant edema- improved review of infectious disease notes thought weight gain was more calorie intake related  Renal function appears to be relatively at baseline with a creatinine of 2.32 and BUN of 56 on lab done on June 05, 2017.  Electrolytes also appear to be stable.  He is also followed by hematology and infectious disease- and continues on medication- he is thought to be stable from that standpoint.Per review of hematology note he is not received Aranesp since July 2018 hemoglobin has remained stable actually was 13.1 on lab done earlier this month.  Also per review of the infectious disease note also in November 2018 thought to be doing well recommendation to continue current medications including Tivicay--Descovy--and return to clinic in approximately 4 months from now.  He is not complaining of any shortness of breath does not have significant edema any weight gain appears to be more appetite and food intake related.  He is tolerating his Foley catheter well continues on Myrbetriq as well.  Regards to type 2 diabetes this appears well controlled with CBGs largely in the lower mid 100s hemoglobin A1c was 7.2 on lab done in late December 2018.  Currently  he is resting in bed comfortably and again has no complaints            Past Medical History:  Diagnosis Date  . AICD (automatic cardioverter/defibrillator) present   . Alcoholism in remission ([place])   . Anemia   . Aortic insufficiency   . Arthritis   . At moderate risk for fall   . Bell's palsy   . Cardiomyopathy ([place])   . Chronic systolic heart  failure (HCC)    NYHA Class III  . CKD (chronic kidney disease) stage 3, GFR 30-59 ml/min (HCC)   . COPD (chronic obstructive pulmonary disease) ([place])   . Essential hypertension, benign   . HIV disease ([place]) 09/06/2016  . Hyperlipidemia   . Noncompliance with medication regimen   . Nonischemic cardiomyopathy (HCC)    EF 15-20%  . NSVT (nonsustained ventricular tachycardia) ([name])   . Numbness of right jaw    Had a stoke in 01/2013. Numbness is occasional, especially when trying to chew.  . Productive cough 08/2013   With brown sputum   . SOB (shortness of breath) on exertion 08/2013  . Stroke ([place]) 01/2013   weakness of right side from CVA  . Type 2 diabetes mellitus ([place])   . Uses hearing aid 2014   recently received new hearing aids   Past Surgical History:  Procedure Laterality Date  . BIOPSY N/A 04/10/2014   Procedure: GASTRIC BIOPSY;  Surgeon: Daneil Dolin, MD;  Location: [facility] ORS;  Service: Endoscopy;  Laterality: N/A;  . BIOPSY  10/12/2015   Procedure: BIOPSY;  Surgeon: Daneil Dolin, MD;  Location: [facility] ENDO SUITE;  Service: Endoscopy;;  stomach bx's  . CARDIAC DEFIBRILLATOR PLACEMENT  11/12/12   [employer] Inogen MINI ICD implanted in [place] at [facility] per Dr Nelly Laurence note  . COLONOSCOPY WITH PROPOFOL N/A 04/10/2014   RMR: Colonic Diverticulosis  . ESOPHAGOGASTRODUODENOSCOPY (EGD) WITH PROPOFOL N/A 04/10/2014   RMR: Mild erosive reflux esophagitis. Multiple antral polyps likely hyperplastic status post removal by hot snare cautery technique. Diffusely abnormal stomach status post gastric biopsy I suspect some of patients anemaia may be due to intermittent oozing from the stomach. It would be difficult and a risky proposition to attempt complete removal of all of his gastric polyps.  . ESOPHAGOGASTRODUODENOSCOPY (EGD) WITH PROPOFOL N/A 10/12/2015   Procedure: ESOPHAGOGASTRODUODENOSCOPY (EGD) WITH PROPOFOL;  Surgeon:  Daneil Dolin, MD;  Location: [facility];  Service: Endoscopy;  Laterality: N/A;  1779 - moved to 9:15  . POLYPECTOMY N/A 04/10/2014   Procedure: GASTRIC POLYPECTOMY;  Surgeon: Daneil Dolin, MD;  Location: [facility];  Service: Endoscopy;  Laterality: N/A;  . RIGHT HEART CATHETERIZATION N/A 02/06/2014   Procedure: RIGHT HEART CATH;  Surgeon: Larey Dresser, MD;  Location[facility] CATH LAB;  Service: Cardiovascular;  Laterality: N/A;    No Known Allergies  Outpatient Encounter Medications as of 06/12/2017  Medication Sig  . acetaminophen (TYLENOL) 325 MG tablet Take 2 tablets (650 mg total) by mouth every 4 (four) hours as needed for mild pain (or temp > 37.5 C (99.5 F)).  Marland Kitchen aspirin 325 MG tablet Take 1 tablet (325 mg total) by mouth daily.  Marland Kitchen atorvastatin (LIPITOR) 80 MG tablet Take 80 mg by mouth daily.   Marland Kitchen atovaquone (MEPRON) 750 MG/5ML suspension Take 10 mLs (1,500 mg total) by mouth daily with breakfast.  . carvedilol (COREG) 3.125 MG tablet Take 1 tablet (  3.125 mg total) by mouth 2 (two) times daily with a meal.  . Cholecalciferol (VITAMIN D3) 2000 UNITS TABS Take 2,000 mg by mouth daily.  . digoxin (LANOXIN) 0.125 MG tablet TAKE 1/2 TABLET BY MOUTH DAILY  . dolutegravir (TIVICAY) 50 MG tablet Take 1 tablet (50 mg total) by mouth daily.  Marland Kitchen emtricitabine-tenofovir AF (DESCOVY) 200-25 MG tablet Take 1 tablet by mouth daily.  . ferrous sulfate 325 (65 FE) MG tablet Take 325 mg by mouth daily with breakfast.  . furosemide (LASIX) 20 MG tablet Take 20 mg by mouth daily.  . isosorbide dinitrate (ISORDIL) 10 MG tablet Take 10 mg by mouth 3 (three) times daily.   . mirabegron ER (MYRBETRIQ) 25 MG TB24 tablet Take 25 mg by mouth daily.  . Multiple Vitamin (MULTIVITAMIN WITH MINERALS) TABS tablet Take 1 tablet by mouth daily.  . pantoprazole (PROTONIX) 40 MG tablet TAKE ONE TABLET   BY MOUTH   DAILY   No facility-administered encounter medications on file as of 06/12/2017.      Review of Systems    In general is not complaining of any fever or chills weight appears to be relatively stable.  Skin is not complaining of rashes or itching.  Head ears eyes nose mouth and throat does not complain of any sore throat or visual changes.  Respiratory denies shortness of breath or cough.  Cardiac denies chest pain does not have significant lower extremity edema.  GI does not complain of any abdominal discomfort apparently eats pretty well per nurses but does not complain of nausea vomiting diarrhea constipation.  GU does have an indwelling Foley catheter does not complain of dysuria.  Musculoskeletal is not complaining of joint pain currently.  Neurologic does not complain of dizziness headache or syncope has some chronic weakness.  Psych nursing does not report any issues he has been pleasant and cooperative.  He does not complain of anxiety or feeling depressed-he does have somewhat of a slow speech pattern and does not verbalize a whole  lot  Immunization History  Administered Date(s) Administered  . Hepatitis A, Adult 09/19/2016  . Hepatitis B, adult 09/19/2016  . Influenza-Unspecified 03/13/2015, 07/07/2016, 03/03/2017  . Pneumococcal Conjugate-13 03/03/2017  . Pneumococcal Polysaccharide-23 07/08/2016, 09/19/2016  . Tdap 02/23/2017   Pertinent  Health Maintenance Due  Topic Date Due  . FOOT EXAM  06/28/2017 (Originally 02/10/1961)  . OPHTHALMOLOGY EXAM  06/28/2017 (Originally 02/10/1961)  . URINE MICROALBUMIN  06/28/2017 (Originally 02/10/1961)  . HEMOGLOBIN A1C  11/22/2017  . PNA vac Low Risk Adult (2 of 2 - PPSV23) 09/19/2021  . COLONOSCOPY  04/10/2024   Fall Risk  04/18/2017 02/23/2017 11/29/2016 11/29/2016  Falls in the past year? No No No No   Functional Status Survey:    Vitals:   06/12/17 1605  BP: (!) 111/59  Pulse: 68  Resp: 16  Temp: 98.4 F (36.9 C)  TempSrc: Oral  SpO2: 94%  Weight: 142 lb 9.6 oz (64.7 kg)  Height: 5\' 3"  (1.6 m)   Body mass index is  25.26 kg/m. Physical Exam  In general this is a pleasant elderly male in no distress lying comfortably in bed.  His skin is warm and dry.  Eyes sclera and conjunctive are clear visual acuity appears grossly intact.  Oropharynx is clear mucous membranes moist.  Chest is clear to auscultation there is no labored breathing.  Heart is regular rate and rhythm with distant heart sounds-from what I could ascertain was regular rate and rhythm  radial pulse was regular there is no significant lower extremity edema pedal pulses are intact bilaterally.  Abdomen is soft nontender with positive bowel sounds.  GU does not indwelling Foley catheter draining amber colored urine I do not appreciate any suprapubic tenderness.  Musculoskeletal has some general frailty but is able to move all extremities x4  Neurologic he is alert could not really appreciate lateralizing findings cranial nerves appear intact-- speech is clear but he does not speak much   Psych he is oriented to self have somewhat of a slowed speech pattern suspect he does have some cognitive deficits but does well with supportive care   Labs reviewed: Recent Labs    09/09/16 0626  10/18/16 0718  02/14/17 0800 02/22/17 1020  05/29/17 0300 05/31/17 0730 06/05/17 0700  NA 142   < > 132*   < > 135 135   < > 137 135 136  K 3.4*   < > 5.5*   < > 4.3 4.8   < > 4.6 4.4 4.3  CL 112*   < > 99*   < > 101 98*   < > 102 100* 101  CO2 26   < > 26   < > 27 26   < > 26 25 25   GLUCOSE 113*   < > 124*   < > 131* 208*   < > 111* 125* 117*  BUN 92*   < > 62*   < > 67* 67*   < > 59* 56* 56*  CREATININE 2.94*   < > 1.54*   < > 2.08* 2.32*   < > 2.20* 2.32* 2.32*  CALCIUM 7.6*   < > 9.6   < > 9.9  10.3* 9.6   < > 9.6 9.7 9.5  MG 1.6*  --   --   --   --   --   --   --   --   --   PHOS 2.8   < > 4.3  --  3.3 3.2  --   --   --   --    < > = values in this interval not displayed.   Recent Labs    12/10/16 0437 12/11/16 0421 02/14/17 0800  02/22/17 1020  AST 56* 36  --  49*  ALT 55 54  --  57  ALKPHOS 55 41  --  83  BILITOT 0.6 0.7  --  0.8  PROT 6.4* 5.9*  --  8.0  ALBUMIN 2.3* 2.2* 3.5 3.5   Recent Labs    04/25/17 1036 05/15/17 0730 05/29/17 0300 06/05/17 0700  WBC 2.5* 2.5* 2.7* 3.0*  NEUTROABS 1.3*  --  1.5* 1.7  HGB 12.3* 12.1* 12.9* 13.1  HCT 40.2 38.9* 40.7 41.0  MCV 94.1 92.8 93.3 92.6  PLT 127* 125* 135* 135*   Lab Results  Component Value Date   TSH 3.661 02/15/2017   Lab Results  Component Value Date   HGBA1C 7.2 (H) 05/24/2017   Lab Results  Component Value Date   CHOL 82 02/15/2017   HDL 28 (L) 02/15/2017   LDLCALC 42 02/15/2017   TRIG 61 02/15/2017   CHOLHDL 2.9 02/15/2017    Significant Diagnostic Results in last 30 days:  No results found.  Assessment/Plan  : #1 history of combined systolic and diastolic CHF- he continues on low-dose Lasix as well as digoxin nitrite and Coreg this appears stable at this point any weight gain appears to be more appetite related-metabolic  panel renal function looks relatively stable 2.32 creatinine appears to be on the higher end of recent baseline will monitor periodically.  2.  History of chronic renal disease again this is significant as noted above but relatively stable he is followed by nephrology clinically appears to be relatively stable.  3.  History of type 2 diabetes this appears diet controlled blood sugars largely in the lower mid 100s hemoglobin A1c 7.2 on lab done in December shows stability.  4.  History of HIV disease continues to follow with infectious disease continues on current medications as noted above-thought to be stable with follow-up scheduled in approximately 4 months.  5.  History of anemia because of renal disease and chronic infection hemoglobin has been stable most recently 13.1 on lab done earlier this month-this is followed by hematology and he does not need Aricept per their review at this time-recommendation to resume  if hemoglobin falls below 11. Of note he is on iron as well as Protonix  6.  History of chronic Foley catheter with history of retention he appears to be tolerating this well he also continues on Myrbetriq.  7.  History of hyperlipidemia he is on Lipitor LDL was stable at 42 on lab done in September 2018.  8.  Hypertension this appears stable as well he is on Coreg low dose   #9 history CVA he appears to be doing well with supportive care nursing does not report any recent issues he is on aspirin as well as a statin     [id number]-of note greater than 35 minutes spent assessing patient reviewing his chart-reviewing his labs discussing his status with nursing staff reviewing consult notes- and coordinating and formulating a plan of care for numerous diagnoses- of note greater than 50% of time spent coordinating plan of care

## 2017-06-15 ENCOUNTER — Non-Acute Institutional Stay (SKILLED_NURSING_FACILITY): Payer: Medicare HMO | Admitting: Internal Medicine

## 2017-06-15 ENCOUNTER — Encounter: Payer: Self-pay | Admitting: Internal Medicine

## 2017-06-15 DIAGNOSIS — Z978 Presence of other specified devices: Secondary | ICD-10-CM

## 2017-06-15 DIAGNOSIS — Z9289 Personal history of other medical treatment: Secondary | ICD-10-CM | POA: Diagnosis not present

## 2017-06-15 DIAGNOSIS — R339 Retention of urine, unspecified: Secondary | ICD-10-CM | POA: Diagnosis not present

## 2017-06-15 DIAGNOSIS — Z960 Presence of urogenital implants: Secondary | ICD-10-CM

## 2017-06-15 NOTE — Progress Notes (Addendum)
Location:   [place] Room Number: 148/D Place of Service:  SNF (31) Provider:  Quintella Baton, MD  Patient Care Team: Sinda Du, MD as PCP - General (Pulmonary Disease) Gala Romney Cristopher Estimable, MD as Consulting Physician (Gastroenterology)  Extended Emergency Contact Information Primary Emergency Contact: [facility] Address: [address], [place] [address] [place] Phone: [phone] Relation: Relative Secondary Emergency Contact: [facility] Address: [address] [place] [address] [place] Phone: [phone] Relation: Sister  Code Status:  DNR Goals of care: Advanced Directive information Advanced Directives 06/15/2017  Does Patient Have a Medical Advance Directive? Yes  Type of Advance Directive Out of facility DNR (pink MOST or yellow form)  Does patient want to make changes to medical advance directive? No - Patient declined  Copy of [place] in Chart? No - copy requested  Would patient like information on creating a medical advance directive? -  Pre-existing out of facility DNR order (yellow form or pink MOST form) -     Chief Complaint  Patient presents with  . Acute Visit    Urinary Retention and Chronic Foley    HPI:  Pt is a 67 y.o. male seen today for an acute visit for Reassess His Chronic Foley. He has h/o Chronic CHF with Cardiomyopathy EF of 30% in 07/18, CKD Stage 3, HIV on treatment with Infectious disease. S/P AICD, hypertension, S/P CVA, Chronic Anemia,  Hyperlipidemia, Diabetes Mellitus, COPD Patient has had Chronic Foley Catheter. It seems he has not seen Urologist and has not Voiding trial. Patient is unable to give any history due to his Dementia.    Past Medical History:  Diagnosis Date  . AICD (automatic cardioverter/defibrillator) present   . Alcoholism in remission ([place])   . Anemia   . Aortic  insufficiency   . Arthritis   . At moderate risk for fall   . Bell's palsy   . Cardiomyopathy ([place])   . Chronic systolic heart failure (HCC)    NYHA Class III  . CKD (chronic kidney disease) stage 3, GFR 30-59 ml/min (HCC)   . COPD (chronic obstructive pulmonary disease) ([place])   . Essential hypertension, benign   . HIV disease ([place]) 09/06/2016  . Hyperlipidemia   . Noncompliance with medication regimen   . Nonischemic cardiomyopathy (HCC)    EF 15-20%  . NSVT (nonsustained ventricular tachycardia) ([place])   . Numbness of right jaw    Had a stoke in 01/2013. Numbness is occasional, especially when trying to chew.  . Productive cough 08/2013   With brown sputum   . SOB (shortness of breath) on exertion 08/2013  . Stroke ([place]) 01/2013   weakness of right side from CVA  . Type 2 diabetes mellitus ([place])   . Uses hearing aid 2014   recently received new hearing aids   Past Surgical History:  Procedure Laterality Date  . BIOPSY N/A 04/10/2014   Procedure: GASTRIC BIOPSY;  Surgeon: Daneil Dolin, MD;  Location: [facility] ORS;  Service: Endoscopy;  Laterality: N/A;  . BIOPSY  10/12/2015   Procedure: BIOPSY;  Surgeon: Daneil Dolin, MD;  Location: [facility] ENDO SUITE;  Service: Endoscopy;;  stomach bx's  . CARDIAC DEFIBRILLATOR PLACEMENT  11/12/12   [employer] Inogen MINI ICD implanted in [place] at [facility]  bioprostetic AVR per Dr Nelly Laurence note  . COLONOSCOPY WITH PROPOFOL N/A 04/10/2014   RMR: Colonic Diverticulosis  . ESOPHAGOGASTRODUODENOSCOPY (EGD) WITH PROPOFOL N/A 04/10/2014   RMR: Mild erosive reflux esophagitis. Multiple antral polyps likely hyperplastic status post removal by hot snare cautery technique. Diffusely abnormal stomach status post gastric biopsy I suspect some of patients anemaia may be due to intermittent oozing from the stomach. It would be difficult and a risky proposition to attempt complete removal of all of his gastric polyps.  .  ESOPHAGOGASTRODUODENOSCOPY (EGD) WITH PROPOFOL N/A 10/12/2015   Procedure: ESOPHAGOGASTRODUODENOSCOPY (EGD) WITH PROPOFOL;  Surgeon: Daneil Dolin, MD;  Location[facility];  Service: Endoscopy;  Laterality: N/A;  5732 - moved to 9:15  . POLYPECTOMY N/A 04/10/2014   Procedure: GASTRIC POLYPECTOMY;  Surgeon: Daneil Dolin, MD;  Location: [facility] ORS;  Service: Endoscopy;  Laterality: N/A;  . RIGHT HEART CATHETERIZATION N/A 02/06/2014   Procedure: RIGHT HEART CATH;  Surgeon: Larey Dresser, MD;  Location[facility] C[facility] LAB;  Service: Cardiovascular;  Laterality: N/A;    No Known Allergies  Outpatient Encounter Medications as of 06/15/2017  Medication Sig  . acetaminophen (TYLENOL) 325 MG tablet Take 2 tablets (650 mg total) by mouth every 4 (four) hours as needed for mild pain (or temp > 37.5 C (99.5 F)).  Marland Kitchen aspirin 325 MG tablet Take 1 tablet (325 mg total) by mouth daily.  Marland Kitchen atorvastatin (LIPITOR) 80 MG tablet Take 80 mg by mouth daily.   Marland Kitchen atovaquone (MEPRON) 750 MG/5ML suspension Take 10 mLs (1,500 mg total) by mouth daily with breakfast.  . carvedilol (COREG) 3.125 MG tablet Take 1 tablet (3.125 mg total) by mouth 2 (two) times daily with a meal.  . Cholecalciferol (VITAMIN D3) 2000 UNITS TABS Take 2,000 mg by mouth daily.  . digoxin (LANOXIN) 0.125 MG tablet TAKE 1/2 TABLET BY MOUTH DAILY  . dolutegravir (TIVICAY) 50 MG tablet Take 1 tablet (50 mg total) by mouth daily.  Marland Kitchen emtricitabine-tenofovir AF (DESCOVY) 200-25 MG tablet Take 1 tablet by mouth daily.  . ferrous sulfate 325 (65 FE) MG tablet Take 325 mg by mouth daily with breakfast.  . furosemide (LASIX) 20 MG tablet Take 20 mg by mouth daily.  . isosorbide dinitrate (ISORDIL) 10 MG tablet Take 10 mg by mouth 3 (three) times daily.   . mirabegron ER (MYRBETRIQ) 25 MG TB24 tablet Take 25 mg by mouth daily.  . Multiple Vitamin (MULTIVITAMIN WITH MINERALS) TABS tablet Take 1 tablet by mouth daily.  . pantoprazole (PROTONIX) 40 MG tablet TAKE  ONE TABLET   BY MOUTH   DAILY   No facility-administered encounter medications on file as of 06/15/2017.      Review of Systems  Constitutional: Negative.   HENT: Negative.   Respiratory: Negative.   Cardiovascular: Negative.   Gastrointestinal: Negative.   Genitourinary: Negative.     Immunization History  Administered Date(s) Administered  . Hepatitis A, Adult 09/19/2016  . Hepatitis B, adult 09/19/2016  . Influenza-Unspecified 03/13/2015, 07/07/2016, 03/03/2017  . Pneumococcal Conjugate-13 03/03/2017  . Pneumococcal Polysaccharide-23 07/08/2016, 09/19/2016  . Tdap 02/23/2017   Pertinent  Health Maintenance Due  Topic Date Due  . FOOT EXAM  06/28/2017 (Originally 02/10/1961)  . OPHTHALMOLOGY EXAM  06/28/2017 (Originally 02/10/1961)  . URINE MICROALBUMIN  06/28/2017 (Originally 02/10/1961)  . HEMOGLOBIN A1C  11/22/2017  . PNA vac Low Risk Adult (2 of 2 - PPSV23) 09/19/2021  . COLONOSCOPY  04/10/2024   Fall Risk  04/18/2017  02/23/2017 11/29/2016 11/29/2016  Falls in the past year? No No No No   Functional Status Survey:    Vitals:   06/15/17 1641  BP: 108/66  Pulse: 80  Resp: 16  Temp: 98.5 F (36.9 C)   There is no height or weight on file to calculate BMI. Physical Exam  Constitutional: He appears well-developed.  HENT:  Head: Normocephalic.  Mouth/Throat: Oropharynx is clear and moist.  Eyes: Pupils are equal, round, and reactive to light.  Neck: Neck supple.  Cardiovascular: Normal rate.  Murmur heard. Pulmonary/Chest: Effort normal and breath sounds normal. No respiratory distress. He has no wheezes.  Abdominal: Soft. Bowel sounds are normal. He exhibits no distension. There is no tenderness. There is no rebound.  Musculoskeletal: He exhibits no edema.  Neurological: He is alert.  Skin: Skin is warm and dry.    Labs reviewed: Recent Labs    09/09/16 0626  10/18/16 0718  02/14/17 0800 02/22/17 1020  05/29/17 0300 05/31/17 0730 06/05/17 0700  NA 142    < > 132*   < > 135 135   < > 137 135 136  K 3.4*   < > 5.5*   < > 4.3 4.8   < > 4.6 4.4 4.3  CL 112*   < > 99*   < > 101 98*   < > 102 100* 101  CO2 26   < > 26   < > 27 26   < > 26 25 25   GLUCOSE 113*   < > 124*   < > 131* 208*   < > 111* 125* 117*  BUN 92*   < > 62*   < > 67* 67*   < > 59* 56* 56*  CREATININE 2.94*   < > 1.54*   < > 2.08* 2.32*   < > 2.20* 2.32* 2.32*  CALCIUM 7.6*   < > 9.6   < > 9.9  10.3* 9.6   < > 9.6 9.7 9.5  MG 1.6*  --   --   --   --   --   --   --   --   --   PHOS 2.8   < > 4.3  --  3.3 3.2  --   --   --   --    < > = values in this interval not displayed.   Recent Labs    12/10/16 0437 12/11/16 0421 02/14/17 0800 02/22/17 1020  AST 56* 36  --  49*  ALT 55 54  --  57  ALKPHOS 55 41  --  83  BILITOT 0.6 0.7  --  0.8  PROT 6.4* 5.9*  --  8.0  ALBUMIN 2.3* 2.2* 3.5 3.5   Recent Labs    04/25/17 1036 05/15/17 0730 05/29/17 0300 06/05/17 0700  WBC 2.5* 2.5* 2.7* 3.0*  NEUTROABS 1.3*  --  1.5* 1.7  HGB 12.3* 12.1* 12.9* 13.1  HCT 40.2 38.9* 40.7 41.0  MCV 94.1 92.8 93.3 92.6  PLT 127* 125* 135* 135*   Lab Results  Component Value Date   TSH 3.661 02/15/2017   Lab Results  Component Value Date   HGBA1C 7.2 (H) 05/24/2017   Lab Results  Component Value Date   CHOL 82 02/15/2017   HDL 28 (L) 02/15/2017   LDLCALC 42 02/15/2017   TRIG 61 02/15/2017   CHOLHDL 2.9 02/15/2017    Significant Diagnostic Results in last 30 days:  No results found.  Assessment/Plan  History of urinary retention with chronic Foley catheter. Have discussed with with nursing staff.  Patient was transferred to our care few months ago and at this time I am not aware why he has chronic Foley and he has ever seen a urologist. We will start him on Flomax 0.4 mg daily  DC Myrbetriq After few days will remove the Foley catheter and try voiding trial. If patient fails will make an appointment with urologist for follow-up.  Family/ staff Communication:   Labs/tests  ordered:   Total time spent in this patient care encounter was _25 minutes; greater than 50% of the visit spent counseling patient, reviewing records , Labs and coordinating care for problems addressed at this encounter.

## 2017-06-19 ENCOUNTER — Encounter: Payer: Self-pay | Admitting: Internal Medicine

## 2017-06-26 ENCOUNTER — Encounter (HOSPITAL_COMMUNITY)
Admission: RE | Admit: 2017-06-26 | Discharge: 2017-06-26 | Disposition: A | Discharge status: Home / Self Care | Payer: Medicare HMO | Source: Skilled Nursing Facility | Attending: *Deleted | Admitting: *Deleted

## 2017-06-26 DIAGNOSIS — I110 Hypertensive heart disease with heart failure: Secondary | ICD-10-CM | POA: Diagnosis not present

## 2017-06-26 LAB — RENAL FUNCTION PANEL
ALBUMIN: 3.3 g/dL — AB (ref 3.5–5.0)
ANION GAP: 10 (ref 5–15)
BUN: 54 mg/dL — AB (ref 6–20)
CO2: 25 mmol/L (ref 22–32)
Calcium: 9.7 mg/dL (ref 8.9–10.3)
Chloride: 101 mmol/L (ref 101–111)
Creatinine, Ser: 2.33 mg/dL — ABNORMAL HIGH (ref 0.61–1.24)
GFR calc Af Amer: 32 mL/min — ABNORMAL LOW (ref >60)
GFR, EST NON AFRICAN AMERICAN: 28 mL/min — AB (ref >60)
Glucose, Bld: 143 mg/dL — ABNORMAL HIGH (ref 65–99)
PHOSPHORUS: 3.3 mg/dL (ref 2.5–4.6)
POTASSIUM: 4.6 mmol/L (ref 3.5–5.1)
Sodium: 136 mmol/L (ref 135–145)

## 2017-06-26 LAB — IRON AND TIBC
IRON: 39 ug/dL — AB (ref 45–182)
SATURATION RATIOS: 15 % — AB (ref 17.9–39.5)
TIBC: 269 ug/dL (ref 250–450)
UIBC: 230 ug/dL

## 2017-06-26 LAB — FERRITIN: Ferritin: 53 ng/mL (ref 24–336)

## 2017-06-27 LAB — VITAMIN D 25 HYDROXY (VIT D DEFICIENCY, FRACTURES): Vit D, 25-Hydroxy: 45.7 ng/mL (ref 30.0–100.0)

## 2017-06-27 LAB — PARATHYROID HORMONE, INTACT (NO CA): PTH: 37 pg/mL (ref 15–65)

## 2017-07-20 ENCOUNTER — Encounter: Payer: Self-pay | Admitting: Internal Medicine

## 2017-07-20 ENCOUNTER — Non-Acute Institutional Stay (SKILLED_NURSING_FACILITY): Payer: Medicare HMO | Admitting: Internal Medicine

## 2017-07-20 DIAGNOSIS — E119 Type 2 diabetes mellitus without complications: Secondary | ICD-10-CM | POA: Diagnosis not present

## 2017-07-20 DIAGNOSIS — D631 Anemia in chronic kidney disease: Secondary | ICD-10-CM

## 2017-07-20 DIAGNOSIS — I5042 Chronic combined systolic (congestive) and diastolic (congestive) heart failure: Secondary | ICD-10-CM | POA: Diagnosis not present

## 2017-07-20 DIAGNOSIS — B20 Human immunodeficiency virus [HIV] disease: Secondary | ICD-10-CM | POA: Diagnosis not present

## 2017-07-20 DIAGNOSIS — N183 Chronic kidney disease, stage 3 unspecified: Secondary | ICD-10-CM

## 2017-07-20 DIAGNOSIS — N189 Chronic kidney disease, unspecified: Secondary | ICD-10-CM | POA: Diagnosis not present

## 2017-07-20 DIAGNOSIS — I639 Cerebral infarction, unspecified: Secondary | ICD-10-CM

## 2017-07-20 NOTE — Progress Notes (Signed)
Location:   [place] Room Number: 148/D Place of Service:  SNF (31) Provider:  Terisa Starr, MD  Patient Care Team: Sinda Du, MD as PCP - General (Pulmonary Disease) Gala Romney Cristopher Estimable, MD as Consulting Physician (Gastroenterology)  Extended Emergency Contact Information Primary Emergency Contact: [facility] Address: [address], [place] [address] [place] Phone: [phone] Relation: Relative Secondary Emergency Contact: [facility] Address: [address] [place] [address] [place] Phone: [phone] Relation: Sister  Code Status:  DNR Goals of care: Advanced Directive information Advanced Directives 07/20/2017  Does Patient Have a Medical Advance Directive? Yes  Type of Advance Directive Out of facility DNR (pink MOST or yellow form)  Does patient want to make changes to medical advance directive? No - Patient declined  Copy of [place] in Chart? No - copy requested  Would patient like information on creating a medical advance directive? -  Pre-existing out of facility DNR order (yellow form or pink MOST form) -    Chief complaint-routine visit for medical management of chronic medical conditions including dementia- CHF-chronic kidney disease-history of HIV-hypertension- history of CVA-anemia-hyperlipidemia-type 2 diabetes as well as COPD-   HPI:  Pt is a 67 y.o. male seen today for medical management of chronic diseases.  As noted above  He has no acute complaints today nursing does not report any recent acute issues-Dr. Lyndel Safe did see him last month for urinary retention he has a chronic indwelling Foley catheter with no clear diagnosis-he was started on Flomax and the catheter was removed and he appears to be doing well with this.  He was seen several months ago for a cough and a chest x-ray showed some pulmonary  congestion he was started on Lasix which he continues on and appears to be doing well with this his weight has been relatively stable in the mid 140s-he has had some mild weight gain but I suspect this is appetite related-he does not have evidence of edema or chest congestion today.  He also has chronic kidney disease renal function appears to be relatively stable with a creatinine of 2.33 BUN of 54 on lab done late last month-.  He does have a history of HIV and is doing well on current medications he is followed closely by hematology r and they actually have ordered updated labs for later this month.  Per review of infectious disease no late last year he is thought to be doing well on current medications and plans are to return to clinic in a couple months.  He is a type II diabetic that is diet-controlled hemoglobin A1c was 7.2 and a late December 2018 lab blood sugars appear to be largely in the mid 100s with occasional spikes a bit higher but not consistent his blood sugars in  evening are   somewhat higher in the higher 100s generally  Currently he is resting in bed comfortably which he likes to do and has no complaints  .     Past Medical History:  Diagnosis Date  . AICD (automatic cardioverter/defibrillator) present   . Alcoholism in remission ([place])   . Anemia   . Aortic insufficiency   . Arthritis   . At moderate risk for fall   . Bell's palsy   . Cardiomyopathy ([place])   . Chronic systolic  heart failure (HCC)    NYHA Class III  . CKD (chronic kidney disease) stage 3, GFR 30-59 ml/min (HCC)   . COPD (chronic obstructive pulmonary disease) ([place])   . Essential hypertension, benign   . HIV disease ([place]) 09/06/2016  . Hyperlipidemia   . Noncompliance with medication regimen   . Nonischemic cardiomyopathy (HCC)    EF 15-20%  . NSVT (nonsustained ventricular tachycardia) ([place])   . Numbness of right jaw    Had a stoke in 01/2013. Numbness is occasional, especially when trying  to chew.  . Productive cough 08/2013   With brown sputum   . SOB (shortness of breath) on exertion 08/2013  . Stroke ([place]) 01/2013   weakness of right side from CVA  . Type 2 diabetes mellitus ([place])   . Uses hearing aid 2014   recently received new hearing aids   Past Surgical History:  Procedure Laterality Date  . BIOPSY N/A 04/10/2014   Procedure: GASTRIC BIOPSY;  Surgeon: Daneil Dolin, MD;  Location: [facility] OR[facility];  Service: Endoscopy;  Laterality: N/A;  . BIOPSY  10/12/2015   Procedure: BIOPSY;  Surgeon: Daneil Dolin, MD;  [facility] ENDO [facility];  Service: Endoscopy;;  stomach bx's  . CARDIAC DEFIBRILLATOR PLACEMENT  11/12/12   [employer] Inogen MINI ICD implanted in [place] at [place] AVR per Dr Nelly Laurence note  . COLONOSCOPY WITH PROPOFOL N/A 04/10/2014   RMR: Colonic Diverticulosis  . ESOPHAGOGASTRODUODENOSCOPY (EGD) WITH PROPOFOL N/A 04/10/2014   RMR: Mild erosive reflux esophagitis. Multiple antral polyps likely hyperplastic status post removal by hot snare cautery technique. Diffusely abnormal stomach status post gastric biopsy I suspect some of patients anemaia may be due to intermittent oozing from the stomach. It would be difficult and a risky proposition to attempt complete removal of all of his gastric polyps.  . ESOPHAGOGASTRODUODENOSCOPY (EGD) WITH PROPOFOL N/A 10/12/2015   Procedure: ESOPHAGOGASTRODUODENOSCOPY (EGD) WITH PROPOFOL;  Surgeon: Daneil Dolin, MD;  Location: [facility];  Service: Endoscopy;  Laterality: N/A;  8115 - moved to 9:15  . POLYPECTOMY N/A 04/10/2014   Procedure: GASTRIC POLYPECTOMY;  Surgeon: Daneil Dolin, MD;  Location: [facility] ORS;  Service: Endoscopy;  Laterality: N/A;  . RIGHT HEART CATHETERIZATION N/A 02/06/2014   Procedure: RIGHT HEART CATH;  Surgeon: Larey Dresser, MD;  Location: [facility] CATH LAB;  Service: Cardiovascular;  Laterality: N/A;    No Known Allergies  Outpatient Encounter  Medications as of 07/20/2017  Medication Sig  . acetaminophen (TYLENOL) 325 MG tablet Take 2 tablets (650 mg total) by mouth every 4 (four) hours as needed for mild pain (or temp > 37.5 C (99.5 F)).  Marland Kitchen aspirin 325 MG tablet Take 1 tablet (325 mg total) by mouth daily.  Marland Kitchen atorvastatin (LIPITOR) 80 MG tablet Take 80 mg by mouth daily.   Marland Kitchen atovaquone (MEPRON) 750 MG/5ML suspension Take 10 mLs (1,500 mg total) by mouth daily with breakfast.  . carvedilol (COREG) 3.125 MG tablet Take 1 tablet (3.125 mg total) by mouth 2 (two) times daily with a meal.  . Cholecalciferol (VITAMIN D3) 2000 UNITS TABS Take 2,000 mg by mouth daily.  . digoxin (LANOXIN) 0.125 MG tablet TAKE 1/2 TABLET BY MOUTH DAILY  . dolutegravir (TIVICAY) 50 MG tablet Take 1 tablet (50 mg total) by mouth daily.  Marland Kitchen emtricitabine-tenofovir AF (DESCOVY) 200-25 MG tablet Take 1 tablet by mouth daily.  . ferrous sulfate 325 (65 FE) MG  tablet Take 325 mg by mouth daily with breakfast.  . furosemide (LASIX) 20 MG tablet Take 20 mg by mouth daily.  . isosorbide dinitrate (ISORDIL) 10 MG tablet Take 10 mg by mouth 3 (three) times daily.   . Multiple Vitamin (MULTIVITAMIN WITH MINERALS) TABS tablet Take 1 tablet by mouth daily.  . ranitidine (ZANTAC) 150 MG tablet Take 150 mg by mouth at bedtime.  . tamsulosin (FLOMAX) 0.4 MG CAPS capsule Take 0.4 mg by mouth daily.  . [DISCONTINUED] mirabegron ER (MYRBETRIQ) 25 MG TB24 tablet Take 25 mg by mouth daily.  . [DISCONTINUED] pantoprazole (PROTONIX) 40 MG tablet TAKE ONE TABLET   BY MOUTH   DAILY   No facility-administered encounter medications on file as of 07/20/2017.      Review of Systems   This is limited secondary to dementia please see HPI  Immunization History  Administered Date(s) Administered  . Hepatitis A, Adult 09/19/2016  . Hepatitis B, adult 09/19/2016  . Influenza-Unspecified 03/13/2015, 07/07/2016, 03/03/2017  . Pneumococcal Conjugate-13 03/03/2017  . Pneumococcal  Polysaccharide-23 07/08/2016, 09/19/2016  . Tdap 02/23/2017   Pertinent  Health Maintenance Due  Topic Date Due  . FOOT EXAM  08/17/2017 (Originally 02/10/1961)  . OPHTHALMOLOGY EXAM  08/17/2017 (Originally 02/10/1961)  . URINE MICROALBUMIN  08/17/2017 (Originally 02/10/1961)  . HEMOGLOBIN A1C  11/22/2017  . PNA vac Low Risk Adult (2 of 2 - PPSV23) 09/19/2021  . COLONOSCOPY  04/10/2024   Fall Risk  04/18/2017 02/23/2017 11/29/2016 11/29/2016  Falls in the past year? No No No No   Functional Status Survey:    Vitals:   07/20/17 1222  BP: 118/70  Pulse: 72  Resp: 16  Temp: 98.4 F (36.9 C)  TempSrc: Oral  SpO2: 94%  Weight: 145 lb 12.8 oz (66.1 kg)  Height: 5\' 3"  (1.6 m)   Body mass index is 25.83 kg/m. Physical Exam   In general this is a pleasant elderly male in no distress lying comfortably in be  The skin is warm and dry.  Eyes visual acuity appears to be intact.  Oropharynx is clear mucous membranes moist.  Chest is clear to auscultation with shallow air entry there is no labored breathing.  Heart is regular rate and rhythm with a systolic murmur- he does not have significant lower extremity edema.  Abdomen is soft nontender with positive bowel sounds.  GU not appreciate any suprapubic tenderness or distention again Foley catheter has been removed.  Musculoskeletal has general frailty but is able to move all extremities x4 limited exam since he is in bed but strength appears to be baseline in all 4 extremities.  Neurologic is grossly intact he is alert could not really appreciate lateralizing findings cranial nerves appear to be intact his speech is clear but he is not a big talker.  Psych he is oriented to self he is pleasant appropriate can answer simple straightforward questions without difficulty       Labs reviewed: Recent Labs    09/09/16 0626  02/14/17 0800 02/22/17 1020  05/31/17 0730 06/05/17 0700 06/26/17 0430  NA 142   < > 135 135   < > 135  136 136  K 3.4*   < > 4.3 4.8   < > 4.4 4.3 4.6  CL 112*   < > 101 98*   < > 100* 101 101  CO2 26   < > 27 26   < > 25 25 25   GLUCOSE 113*   < > 131*  208*   < > 125* 117* 143*  BUN 92*   < > 67* 67*   < > 56* 56* 54*  CREATININE 2.94*   < > 2.08* 2.32*   < > 2.32* 2.32* 2.33*  CALCIUM 7.6*   < > 9.9  10.3* 9.6   < > 9.7 9.5 9.7  MG 1.6*  --   --   --   --   --   --   --   PHOS 2.8   < > 3.3 3.2  --   --   --  3.3   < > = values in this interval not displayed.   Recent Labs    12/10/16 0437 12/11/16 0421 02/14/17 0800 02/22/17 1020 06/26/17 0430  AST 56* 36  --  49*  --   ALT 55 54  --  57  --   ALKPHOS 55 41  --  83  --   BILITOT 0.6 0.7  --  0.8  --   PROT 6.4* 5.9*  --  8.0  --   ALBUMIN 2.3* 2.2* 3.5 3.5 3.3*   Recent Labs    04/25/17 1036 05/15/17 0730 05/29/17 0300 06/05/17 0700  WBC 2.5* 2.5* 2.7* 3.0*  NEUTROABS 1.3*  --  1.5* 1.7  HGB 12.3* 12.1* 12.9* 13.1  HCT 40.2 38.9* 40.7 41.0  MCV 94.1 92.8 93.3 92.6  PLT 127* 125* 135* 135*   Lab Results  Component Value Date   TSH 3.661 02/15/2017   Lab Results  Component Value Date   HGBA1C 7.2 (H) 05/24/2017   Lab Results  Component Value Date   CHOL 82 02/15/2017   HDL 28 (L) 02/15/2017   LDLCALC 42 02/15/2017   TRIG 61 02/15/2017   CHOLHDL 2.9 02/15/2017    Significant Diagnostic Results in last 30 days:  No results found.  Assessment/Plan  #1 history of chronic CHF thought to be combination diastolic and systolic with an ejection fraction of 30% on echo done in July 2018-he appears stable on low-dose Lasix mild weight gain I suspect his appetite related he does not have significant chest congestion or edema.  He continues on digoxin in addition to the Lasix as well as Imdur and Coreg.  2.-History of chronic kidney disease stage III this is stable with creatinine of 2.39 BUN of 54 on lab done late last month update labs are pending for later this month these have been ordered by the cancer  center.  3.  Hypertension this appears stable on Coreg Imdur as well as Lasix blood pressure today 118/70 which shows stability.  3.  History of HIV this is followed by infectious disease he continues on Tivicay as well as Descovy  Updated visit is pending it appears in a couple months.  4.-History of CVA he appears to be doing well with supportive care here he is on aspirin as well as a statin- LDL was 42 on lab done in September 2018  #5-history of anemia he continues on iron this is been stable hemoglobin 13.1 on lab done in early January this will be updated later this month as well.  6.-  History of type 2 diabetes this appears stable as noted above with a hemoglobin A1c of 7.2 blood sugars largely in the mid 100s a bit higher later in the day.  7.-History of COPD this hasbeen relatively stable and asymptomatic at this point will warrant monitoring.  8.-History of urinary retention again his catheter has been removed he  has been started on Flomax and this appears to be working well at this point.  9.  History of dementia he is doing well with supportive care no behaviors have been noted he appears to be adapting well to the nursing facility-weight has been stable to slight gain which I suspect is appetite related.  [id number]

## 2017-07-25 ENCOUNTER — Encounter (HOSPITAL_COMMUNITY)
Admission: RE | Admit: 2017-07-25 | Discharge: 2017-07-25 | Disposition: A | Discharge status: Home / Self Care | Payer: Medicare HMO | Source: Skilled Nursing Facility | Attending: Internal Medicine | Admitting: Internal Medicine

## 2017-07-25 ENCOUNTER — Other Ambulatory Visit (HOSPITAL_COMMUNITY): Payer: Self-pay | Admitting: *Deleted

## 2017-07-25 DIAGNOSIS — I110 Hypertensive heart disease with heart failure: Secondary | ICD-10-CM | POA: Diagnosis not present

## 2017-07-25 DIAGNOSIS — D61818 Other pancytopenia: Secondary | ICD-10-CM

## 2017-07-25 DIAGNOSIS — I5022 Chronic systolic (congestive) heart failure: Secondary | ICD-10-CM | POA: Insufficient documentation

## 2017-07-25 DIAGNOSIS — I639 Cerebral infarction, unspecified: Secondary | ICD-10-CM | POA: Insufficient documentation

## 2017-07-25 DIAGNOSIS — Z9181 History of falling: Secondary | ICD-10-CM | POA: Insufficient documentation

## 2017-07-25 DIAGNOSIS — R1312 Dysphagia, oropharyngeal phase: Secondary | ICD-10-CM | POA: Diagnosis present

## 2017-07-25 DIAGNOSIS — N3281 Overactive bladder: Secondary | ICD-10-CM | POA: Insufficient documentation

## 2017-07-25 DIAGNOSIS — I480 Paroxysmal atrial fibrillation: Secondary | ICD-10-CM | POA: Insufficient documentation

## 2017-07-25 LAB — CBC WITH DIFFERENTIAL/PLATELET
Basophils Absolute: 0 10*3/uL (ref 0.0–0.1)
Basophils Relative: 0 %
Eosinophils Absolute: 0.2 10*3/uL (ref 0.0–0.7)
Eosinophils Relative: 6 %
HEMATOCRIT: 38.3 % — AB (ref 39.0–52.0)
HEMOGLOBIN: 12.1 g/dL — AB (ref 13.0–17.0)
LYMPHS PCT: 16 %
Lymphs Abs: 0.5 10*3/uL — ABNORMAL LOW (ref 0.7–4.0)
MCH: 28.7 pg (ref 26.0–34.0)
MCHC: 31.6 g/dL (ref 30.0–36.0)
MCV: 91 fL (ref 78.0–100.0)
Monocytes Absolute: 0.6 10*3/uL (ref 0.1–1.0)
Monocytes Relative: 20 %
NEUTROS PCT: 58 %
Neutro Abs: 1.6 10*3/uL — ABNORMAL LOW (ref 1.7–7.7)
Platelets: 146 10*3/uL — ABNORMAL LOW (ref 150–400)
RBC: 4.21 MIL/uL — AB (ref 4.22–5.81)
RDW: 13.4 % (ref 11.5–15.5)
WBC: 2.9 10*3/uL — AB (ref 4.0–10.5)

## 2017-07-25 LAB — COMPREHENSIVE METABOLIC PANEL
ALK PHOS: 95 U/L (ref 38–126)
ALT: 31 U/L (ref 17–63)
AST: 25 U/L (ref 15–41)
Albumin: 3.2 g/dL — ABNORMAL LOW (ref 3.5–5.0)
Anion gap: 9 (ref 5–15)
BILIRUBIN TOTAL: 0.5 mg/dL (ref 0.3–1.2)
BUN: 64 mg/dL — ABNORMAL HIGH (ref 6–20)
CALCIUM: 9.3 mg/dL (ref 8.9–10.3)
CO2: 25 mmol/L (ref 22–32)
CREATININE: 2.43 mg/dL — AB (ref 0.61–1.24)
Chloride: 101 mmol/L (ref 101–111)
GFR, EST AFRICAN AMERICAN: 30 mL/min — AB (ref >60)
GFR, EST NON AFRICAN AMERICAN: 26 mL/min — AB (ref >60)
Glucose, Bld: 155 mg/dL — ABNORMAL HIGH (ref 65–99)
Potassium: 4.7 mmol/L (ref 3.5–5.1)
Sodium: 135 mmol/L (ref 135–145)
Total Protein: 7.3 g/dL (ref 6.5–8.1)

## 2017-07-25 LAB — VITAMIN B12: Vitamin B-12: 768 pg/mL (ref 180–914)

## 2017-07-25 LAB — IRON AND TIBC
IRON: 42 ug/dL — AB (ref 45–182)
Saturation Ratios: 16 % — ABNORMAL LOW (ref 17.9–39.5)
TIBC: 270 ug/dL (ref 250–450)
UIBC: 228 ug/dL

## 2017-07-25 LAB — FOLATE: FOLATE: 25.6 ng/mL (ref >5.9)

## 2017-07-25 LAB — FERRITIN: Ferritin: 52 ng/mL (ref 24–336)

## 2017-07-26 ENCOUNTER — Inpatient Hospital Stay (HOSPITAL_COMMUNITY): Payer: Medicare HMO | Attending: Internal Medicine

## 2017-07-26 ENCOUNTER — Encounter (HOSPITAL_COMMUNITY): Payer: Self-pay | Admitting: Internal Medicine

## 2017-07-26 ENCOUNTER — Inpatient Hospital Stay (HOSPITAL_BASED_OUTPATIENT_CLINIC_OR_DEPARTMENT_OTHER): Payer: Medicare HMO | Admitting: Internal Medicine

## 2017-07-26 ENCOUNTER — Other Ambulatory Visit: Payer: Self-pay

## 2017-07-26 DIAGNOSIS — I2510 Atherosclerotic heart disease of native coronary artery without angina pectoris: Secondary | ICD-10-CM

## 2017-07-26 DIAGNOSIS — K219 Gastro-esophageal reflux disease without esophagitis: Secondary | ICD-10-CM | POA: Diagnosis not present

## 2017-07-26 DIAGNOSIS — Z7982 Long term (current) use of aspirin: Secondary | ICD-10-CM | POA: Diagnosis not present

## 2017-07-26 DIAGNOSIS — E1122 Type 2 diabetes mellitus with diabetic chronic kidney disease: Secondary | ICD-10-CM

## 2017-07-26 DIAGNOSIS — Z95810 Presence of automatic (implantable) cardiac defibrillator: Secondary | ICD-10-CM

## 2017-07-26 DIAGNOSIS — N183 Chronic kidney disease, stage 3 (moderate): Secondary | ICD-10-CM | POA: Diagnosis not present

## 2017-07-26 DIAGNOSIS — Z8673 Personal history of transient ischemic attack (TIA), and cerebral infarction without residual deficits: Secondary | ICD-10-CM

## 2017-07-26 DIAGNOSIS — Z87891 Personal history of nicotine dependence: Secondary | ICD-10-CM | POA: Insufficient documentation

## 2017-07-26 DIAGNOSIS — Z79899 Other long term (current) drug therapy: Secondary | ICD-10-CM | POA: Insufficient documentation

## 2017-07-26 DIAGNOSIS — D509 Iron deficiency anemia, unspecified: Secondary | ICD-10-CM

## 2017-07-26 DIAGNOSIS — E785 Hyperlipidemia, unspecified: Secondary | ICD-10-CM | POA: Diagnosis not present

## 2017-07-26 DIAGNOSIS — D631 Anemia in chronic kidney disease: Secondary | ICD-10-CM | POA: Insufficient documentation

## 2017-07-26 DIAGNOSIS — I5042 Chronic combined systolic (congestive) and diastolic (congestive) heart failure: Secondary | ICD-10-CM

## 2017-07-26 DIAGNOSIS — I130 Hypertensive heart and chronic kidney disease with heart failure and stage 1 through stage 4 chronic kidney disease, or unspecified chronic kidney disease: Secondary | ICD-10-CM | POA: Diagnosis present

## 2017-07-26 DIAGNOSIS — J449 Chronic obstructive pulmonary disease, unspecified: Secondary | ICD-10-CM

## 2017-07-26 DIAGNOSIS — D61818 Other pancytopenia: Secondary | ICD-10-CM

## 2017-07-26 DIAGNOSIS — N189 Chronic kidney disease, unspecified: Principal | ICD-10-CM

## 2017-07-26 LAB — COMPREHENSIVE METABOLIC PANEL
ALK PHOS: 102 U/L (ref 38–126)
ALT: 35 U/L (ref 17–63)
AST: 28 U/L (ref 15–41)
Albumin: 3.5 g/dL (ref 3.5–5.0)
Anion gap: 9 (ref 5–15)
BILIRUBIN TOTAL: 0.5 mg/dL (ref 0.3–1.2)
BUN: 62 mg/dL — ABNORMAL HIGH (ref 6–20)
CALCIUM: 9.4 mg/dL (ref 8.9–10.3)
CO2: 24 mmol/L (ref 22–32)
CREATININE: 2.26 mg/dL — AB (ref 0.61–1.24)
Chloride: 100 mmol/L — ABNORMAL LOW (ref 101–111)
GFR calc non Af Amer: 29 mL/min — ABNORMAL LOW (ref >60)
GFR, EST AFRICAN AMERICAN: 33 mL/min — AB (ref >60)
GLUCOSE: 249 mg/dL — AB (ref 65–99)
Potassium: 4.9 mmol/L (ref 3.5–5.1)
SODIUM: 133 mmol/L — AB (ref 135–145)
Total Protein: 8 g/dL (ref 6.5–8.1)

## 2017-07-26 LAB — CBC WITH DIFFERENTIAL/PLATELET
Basophils Absolute: 0 10*3/uL (ref 0.0–0.1)
Basophils Relative: 1 %
EOS ABS: 0.2 10*3/uL (ref 0.0–0.7)
Eosinophils Relative: 8 %
HEMATOCRIT: 39.5 % (ref 39.0–52.0)
HEMOGLOBIN: 12.7 g/dL — AB (ref 13.0–17.0)
LYMPHS PCT: 15 %
Lymphs Abs: 0.5 10*3/uL — ABNORMAL LOW (ref 0.7–4.0)
MCH: 29.1 pg (ref 26.0–34.0)
MCHC: 32.2 g/dL (ref 30.0–36.0)
MCV: 90.4 fL (ref 78.0–100.0)
Monocytes Absolute: 0.4 10*3/uL (ref 0.1–1.0)
Monocytes Relative: 14 %
NEUTROS ABS: 1.9 10*3/uL (ref 1.7–7.7)
NEUTROS PCT: 62 %
Platelets: 124 10*3/uL — ABNORMAL LOW (ref 150–400)
RBC: 4.37 MIL/uL (ref 4.22–5.81)
RDW: 13.3 % (ref 11.5–15.5)
WBC: 3.1 10*3/uL — AB (ref 4.0–10.5)

## 2017-07-26 LAB — IRON AND TIBC
Iron: 52 ug/dL (ref 45–182)
Saturation Ratios: 18 % (ref 17.9–39.5)
TIBC: 293 ug/dL (ref 250–450)
UIBC: 241 ug/dL

## 2017-07-26 LAB — FERRITIN: Ferritin: 59 ng/mL (ref 24–336)

## 2017-07-26 LAB — FOLATE: FOLATE: 33 ng/mL (ref >5.9)

## 2017-07-26 LAB — VITAMIN B12: Vitamin B-12: 1001 pg/mL — ABNORMAL HIGH (ref 180–914)

## 2017-07-26 NOTE — Patient Instructions (Signed)
[place] at [facility]  Discharge Instructions:  Your labs looked good today.  We will see you in 3 to 4 months for follow up and lab work _______________________________________________________________  Thank you for choosing [place] at [facility] to provide your oncology and hematology care.  To afford each patient quality time with our providers, please arrive at least 15 minutes before your scheduled appointment.  You need to re-schedule your appointment if you arrive 10 or more minutes late.  We strive to give you quality time with our providers, and arriving late affects you and other patients whose appointments are after yours.  Also, if you no show three or more times for appointments you may be dismissed from the clinic.  Again, thank you for choosing [place] at [place] hope is that these requests will allow you access to exceptional care and in a timely manner. _______________________________________________________________  If you have questions after your visit, please contact our office at [phone] between the hours of 8:30 a.m. and 5:00 p.m. Voicemails left after 4:30 p.m. will not be returned until the following business day. _______________________________________________________________  For prescription refill requests, have your pharmacy contact our office. _______________________________________________________________  Recommendations made by the consultant and any test results will be sent to your referring physician. _______________________________________________________________

## 2017-08-01 ENCOUNTER — Encounter (HOSPITAL_COMMUNITY)
Admission: RE | Admit: 2017-08-01 | Discharge: 2017-08-01 | Disposition: A | Discharge status: Home / Self Care | Payer: Medicare HMO | Source: Skilled Nursing Facility | Attending: Internal Medicine | Admitting: Internal Medicine

## 2017-08-01 DIAGNOSIS — I639 Cerebral infarction, unspecified: Secondary | ICD-10-CM | POA: Insufficient documentation

## 2017-08-01 DIAGNOSIS — J449 Chronic obstructive pulmonary disease, unspecified: Secondary | ICD-10-CM | POA: Insufficient documentation

## 2017-08-01 DIAGNOSIS — I480 Paroxysmal atrial fibrillation: Secondary | ICD-10-CM | POA: Insufficient documentation

## 2017-08-01 DIAGNOSIS — I5022 Chronic systolic (congestive) heart failure: Secondary | ICD-10-CM | POA: Insufficient documentation

## 2017-08-01 DIAGNOSIS — N3281 Overactive bladder: Secondary | ICD-10-CM | POA: Diagnosis not present

## 2017-08-01 DIAGNOSIS — Z9181 History of falling: Secondary | ICD-10-CM | POA: Insufficient documentation

## 2017-08-01 LAB — BASIC METABOLIC PANEL
Anion gap: 10 (ref 5–15)
BUN: 51 mg/dL — ABNORMAL HIGH (ref 6–20)
CALCIUM: 9.5 mg/dL (ref 8.9–10.3)
CO2: 23 mmol/L (ref 22–32)
CREATININE: 2.14 mg/dL — AB (ref 0.61–1.24)
Chloride: 103 mmol/L (ref 101–111)
GFR, EST AFRICAN AMERICAN: 36 mL/min — AB (ref >60)
GFR, EST NON AFRICAN AMERICAN: 31 mL/min — AB (ref >60)
Glucose, Bld: 152 mg/dL — ABNORMAL HIGH (ref 65–99)
Potassium: 4.6 mmol/L (ref 3.5–5.1)
Sodium: 136 mmol/L (ref 135–145)

## 2017-08-10 ENCOUNTER — Non-Acute Institutional Stay (SKILLED_NURSING_FACILITY): Payer: Medicare HMO | Admitting: Internal Medicine

## 2017-08-10 ENCOUNTER — Encounter: Payer: Self-pay | Admitting: Internal Medicine

## 2017-08-10 DIAGNOSIS — B20 Human immunodeficiency virus [HIV] disease: Secondary | ICD-10-CM

## 2017-08-10 DIAGNOSIS — I10 Essential (primary) hypertension: Secondary | ICD-10-CM | POA: Diagnosis not present

## 2017-08-10 DIAGNOSIS — N189 Chronic kidney disease, unspecified: Secondary | ICD-10-CM | POA: Diagnosis not present

## 2017-08-10 DIAGNOSIS — I5042 Chronic combined systolic (congestive) and diastolic (congestive) heart failure: Secondary | ICD-10-CM

## 2017-08-10 DIAGNOSIS — D631 Anemia in chronic kidney disease: Secondary | ICD-10-CM | POA: Diagnosis not present

## 2017-08-10 DIAGNOSIS — E119 Type 2 diabetes mellitus without complications: Secondary | ICD-10-CM

## 2017-08-10 NOTE — Progress Notes (Signed)
Location:   [place] Room Number: 148/D Place of Service:  SNF (31) Provider:  Quintella Baton, MD  Patient Care Team: Sinda Du, MD as PCP - General (Pulmonary Disease) Gala Romney Cristopher Estimable, MD as Consulting Physician (Gastroenterology)  Extended Emergency Contact Information Primary Emergency Contact: [facility] Address: [address], [name] [address] [place] Phone: [phone] Relation: Relative Secondary Emergency Contact: [facility] Address: [address], [place] [address] [place] Phone: [phone] Relation: Sister  Code Status:  DNR Goals of care: Advanced Directive information Advanced Directives 08/10/2017  Does Patient Have a Medical Advance Directive? Yes  Type of Advance Directive Out of facility DNR (pink MOST or yellow form)  Does patient want to make changes to medical advance directive? No - Patient declined  Copy of [place] in Chart? No - copy requested  Would patient like information on creating a medical advance directive? -  Pre-existing out of facility DNR order (yellow form or pink MOST form) -     Chief Complaint  Patient presents with  . Medical Management of Chronic Issues    Routine Visit,    HPI:  Pt is a 67 y.o. male seen today for medical management of chronic diseases.   He has h/o Chronic CHF with Cardiomyopathy EF of 30% in 07/18, CKD Stage 3, HIV on treatment with Infectious disease. S/P AICD, hypertension,  S/P CVA, Chronic Anemia,  Hyperlipidemia, Diabetes Mellitus, COPD  Patient is Long term Resident of facility and is doing well. His only complain today was Left shoulder Pain which has started recently. Per Nurses patient has working with Both OT and PT and they noticed that he would Wince in pain when thay will raise his arm above his shoulder and her will c/o Pain. Otherwise patient  has been doing well . His weight  Is now 148 lbs which is now 10 lbs more then his  previous weight. But his swelling in the legs has been better since he has been on Lasix. He is not SOB or has Cough. He follows with Infectious disease for his HIV and per their notes he is doing well. His BS have been more then 200 sometimes. He follows with nephrology for his Renal Insufficiency.  Past Medical History:  Diagnosis Date  . AICD (automatic cardioverter/defibrillator) present   . Alcoholism in remission ([place])   . Anemia   . Aortic insufficiency   . Arthritis   . At moderate risk for fall   . Bell's palsy   . Cardiomyopathy ([place])   . Chronic systolic heart failure (HCC)    NYHA Class III  . CKD (chronic kidney disease) stage 3, GFR 30-59 ml/min (HCC)   . COPD (chronic obstructive pulmonary disease) ([place])   . Essential hypertension, benign   . HIV disease ([place]) 09/06/2016  . Hyperlipidemia   . Noncompliance with medication regimen   . Nonischemic cardiomyopathy (HCC)    EF 15-20%  . NSVT (nonsustained ventricular tachycardia) ([place])   . Numbness of right jaw    Had a stoke in 01/2013. Numbness is occasional, especially when trying to chew.  . Productive cough 08/2013   With brown sputum   . SOB (shortness of breath) on exertion 08/2013  . Stroke ([place]) 01/2013   weakness of right side from CVA  .  Type 2 diabetes mellitus ([place])   . Uses hearing aid 2014   recently received new hearing aids   Past Surgical History:  Procedure Laterality Date  . BIOPSY N/A 04/10/2014   Procedure: GASTRIC BIOPSY;  Surgeon: Daneil Dolin, MD;  Location: [facility] ORS;  Service: Endoscopy;  Laterality: N/A;  . BIOPSY  10/12/2015   Procedure: BIOPSY;  Surgeon: Daneil Dolin, MD;  Location: [facility] ENDO SUITE;  Service: Endoscopy;;  stomach bx's  . CARDIAC DEFIBRILLATOR PLACEMENT  11/12/12   [employer] Inogen MINI ICD implanted in [place] at [facility] per  Dr Nelly Laurence note  . COLONOSCOPY WITH PROPOFOL N/A 04/10/2014   RMR: Colonic Diverticulosis  . ESOPHAGOGASTRODUODENOSCOPY (EGD) WITH PROPOFOL N/A 04/10/2014   RMR: Mild erosive reflux esophagitis. Multiple antral polyps likely hyperplastic status post removal by hot snare cautery technique. Diffusely abnormal stomach status post gastric biopsy I suspect some of patients anemaia may be due to intermittent oozing from the stomach. It would be difficult and a risky proposition to attempt complete removal of all of his gastric polyps.  . ESOPHAGOGASTRODUODENOSCOPY (EGD) WITH PROPOFOL N/A 10/12/2015   Procedure: ESOPHAGOGASTRODUODENOSCOPY (EGD) WITH PROPOFOL;  Surgeon: Daneil Dolin, MD;  Location: [facility] ENDO SU[facility];  Service: Endoscopy;  Laterality: N/A;  9563 - moved to 9:15  . POLYPECTOMY N/A 04/10/2014   Procedure: GASTRIC POLYPECTOMY;  Surgeon: Daneil Dolin, MD;  Location: [facility] ORS;  Service: Endoscopy;  Laterality: N/A;  . RIGHT HEART CATHETERIZATION N/A 02/06/2014   Procedure: RIGHT HEART CATH;  Surgeon: Larey Dresser, MD;  Location: [facility] CATH LAB;  Service: Cardiovascular;  Laterality: N/A;    No Known Allergies  Outpatient Encounter Medications as of 08/10/2017  Medication Sig  . acetaminophen (TYLENOL) 325 MG tablet Take 2 tablets (650 mg total) by mouth every 4 (four) hours as needed for mild pain (or temp > 37.5 C (99.5 F)).  Marland Kitchen aspirin 325 MG tablet Take 1 tablet (325 mg total) by mouth daily.  Marland Kitchen atorvastatin (LIPITOR) 80 MG tablet Take 80 mg by mouth daily.   Marland Kitchen atovaquone (MEPRON) 750 MG/5ML suspension Take 10 mLs (1,500 mg total) by mouth daily with breakfast.  . carvedilol (COREG) 3.125 MG tablet Take 1 tablet (3.125 mg total) by mouth 2 (two) times daily with a meal.  . Cholecalciferol (VITAMIN D3) 2000 UNITS TABS Take 2,000 mg by mouth daily.  . digoxin (LANOXIN) 0.125 MG tablet TAKE 1/2 TABLET BY MOUTH DAILY  . dolutegravir (TIVICAY) 50 MG tablet Take 1 tablet (50 mg total) by mouth  daily.  Marland Kitchen emtricitabine-tenofovir AF (DESCOVY) 200-25 MG tablet Take 1 tablet by mouth daily.  . ferrous sulfate 325 (65 FE) MG tablet Take 325 mg by mouth daily with breakfast.  . furosemide (LASIX) 20 MG tablet Take 20 mg by mouth daily.  . isosorbide dinitrate (ISORDIL) 10 MG tablet Take 10 mg by mouth 3 (three) times daily.   . Multiple Vitamin (MULTIVITAMIN WITH MINERALS) TABS tablet Take 1 tablet by mouth daily.  . ranitidine (ZANTAC) 150 MG tablet Take 150 mg by mouth at bedtime.  . tamsulosin (FLOMAX) 0.4 MG CAPS capsule Take 0.4 mg by mouth daily.   No facility-administered encounter medications on file as of 08/10/2017.      Review of Systems  Review of Systems  Constitutional: Negative for activity change, appetite change, chills, diaphoresis, fatigue and fever.  HENT: Negative for mouth sores, postnasal drip, rhinorrhea, sinus  pain and sore throat.   Respiratory: Negative for apnea, cough, chest tightness, shortness of breath and wheezing.   Cardiovascular: Negative for chest pain, palpitations and leg swelling.  Gastrointestinal: Negative for abdominal distention, abdominal pain, constipation, diarrhea, nausea and vomiting.  Genitourinary: Negative for dysuria and frequency.  Musculoskeletal: Negative for arthralgias, joint swelling and myalgias.  Skin: Negative for rash.  Neurological: Negative for dizziness, syncope, weakness, light-headedness and numbness.  Psychiatric/Behavioral: Negative for behavioral problems, confusion and sleep disturbance.     Immunization History  Administered Date(s) Administered  . Hepatitis A, Adult 09/19/2016  . Hepatitis B, adult 09/19/2016  . Influenza-Unspecified 03/13/2015, 07/07/2016, 03/03/2017  . Pneumococcal Conjugate-13 03/03/2017  . Pneumococcal Polysaccharide-23 07/08/2016, 09/19/2016  . Tdap 02/23/2017   Pertinent  Health Maintenance Due  Topic Date Due  . FOOT EXAM  08/17/2017 (Originally 02/10/1961)  . OPHTHALMOLOGY EXAM   08/17/2017 (Originally 02/10/1961)  . URINE MICROALBUMIN  08/17/2017 (Originally 02/10/1961)  . HEMOGLOBIN A1C  11/22/2017  . PNA vac Low Risk Adult (2 of 2 - PPSV23) 09/19/2021  . COLONOSCOPY  04/10/2024   Fall Risk  04/18/2017 02/23/2017 11/29/2016 11/29/2016  Falls in the past year? No No No No   Functional Status Survey:    Vitals:   08/10/17 1020  BP: (!) 112/56  Pulse: (!) 59  Resp: 16  Temp: 98.4 F (36.9 C)  TempSrc: Oral  SpO2: 94%  Weight: 148 lb (67.1 kg)  Height: 5\' 3"  (1.6 m)   Body mass index is 26.22 kg/m. Physical Exam  Constitutional: He appears well-developed and well-nourished.  HENT:  Head: Normocephalic.  Mouth/Throat: Oropharynx is clear and moist.  Eyes: Pupils are equal, round, and reactive to light.  Neck: Neck supple.  Cardiovascular: Normal rate and normal heart sounds.  No murmur heard. Pulmonary/Chest: Effort normal and breath sounds normal. No respiratory distress. He has no wheezes. He has no rales.  Abdominal: Soft. Bowel sounds are normal. He exhibits no distension. There is no tenderness. There is no rebound.  Musculoskeletal:  Trace edema Bilateral  Also C/O Pain in Left shoulder on Passive motion.  Lymphadenopathy:    He has no cervical adenopathy.  Neurological: He is alert.  Skin: Skin is warm and dry.  Psychiatric: He has a normal mood and affect. His behavior is normal.    Labs reviewed: Recent Labs    09/09/16 0626  02/14/17 0800 02/22/17 1020  06/26/17 0430 07/25/17 0800 07/26/17 1032 08/01/17 0711  NA 142   < > 135 135   < > 136 135 133* 136  K 3.4*   < > 4.3 4.8   < > 4.6 4.7 4.9 4.6  CL 112*   < > 101 98*   < > 101 101 100* 103  CO2 26   < > 27 26   < > 25 25 24 23   GLUCOSE 113*   < > 131* 208*   < > 143* 155* 249* 152*  BUN 92*   < > 67* 67*   < > 54* 64* 62* 51*  CREATININE 2.94*   < > 2.08* 2.32*   < > 2.33* 2.43* 2.26* 2.14*  CALCIUM 7.6*   < > 9.9  10.3* 9.6   < > 9.7 9.3 9.4 9.5  MG 1.6*  --   --   --   --    --   --   --   --   PHOS 2.8   < > 3.3 3.2  --  3.3  --   --   --    < > = values in this interval not displayed.   Recent Labs    02/22/17 1020 06/26/17 0430 07/25/17 0800 07/26/17 1032  AST 49*  --  25 28  ALT 57  --  31 35  ALKPHOS 83  --  95 102  BILITOT 0.8  --  0.5 0.5  PROT 8.0  --  7.3 8.0  ALBUMIN 3.5 3.3* 3.2* 3.5   Recent Labs    06/05/17 0700 07/25/17 0800 07/26/17 1032  WBC 3.0* 2.9* 3.1*  NEUTROABS 1.7 1.6* 1.9  HGB 13.1 12.1* 12.7*  HCT 41.0 38.3* 39.5  MCV 92.6 91.0 90.4  PLT 135* 146* 124*   Lab Results  Component Value Date   TSH 3.661 02/15/2017   Lab Results  Component Value Date   HGBA1C 7.2 (H) 05/24/2017   Lab Results  Component Value Date   CHOL 82 02/15/2017   HDL 28 (L) 02/15/2017   LDLCALC 42 02/15/2017   TRIG 61 02/15/2017   CHOLHDL 2.9 02/15/2017    Significant Diagnostic Results in last 30 days:  No results found.  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 HIV disease  Follows with Infectious disease. Per their Notes his Viral Load is suppressed No Changes were made to his antiviral medications  History of stroke Doing well with supportive care. 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  Chronic renal Disease Has f/u with Nephrology.  Hyperlipidemia On Lipitor LDL 42 in 09/18    Family/ staff Communication:   Labs/tests ordered:

## 2017-08-13 NOTE — Progress Notes (Signed)
Diagnosis No diagnosis found.  Staging Cancer Staging No matching staging information was found for the patient.  Assessment and Plan:  1. Anemia due to CKD, iron deficiency, and anemia of chronic disease: Labs done 07/25/2017 show WBC 2.9, HB 12 plts 146,000.  He has not received aranesp since 12/20/16; his hemoglobin has been consistently between 12-13 g/dL. He will RTC in 09/2017 for follow-up and repeat labs.  He is advised to notify the office if he has any problems prior to his next visit.    2.  CKD,  Cr 2.43.  Continue to follow-up with PCP or nephrology.    3.  HIV.  Continue to follow-up with PCP.    Interval History:  Pt followed by Dr. Talbert Cage for anemia due to CKD.  Pt was being treated with Aranesp.    Current Status:  Pt is seen today for follow-up.  He is doing well and is here to go over labs.    Problem List Patient Active Problem List   Diagnosis Date Noted  . Metabolic encephalopathy [W38.93] 12/13/2016  . Urinary tract infection with hematuria [N39.0, R31.9] 12/09/2016  . Chronic combined systolic and diastolic CHF (congestive heart failure) ([place]) [I50.42] 12/09/2016  . COPD (chronic obstructive pulmonary disease) ([place]) [J44.9] 09/12/2016  . Encounter for hospice care discussion [Z71.89]   . Protein-calorie malnutrition, severe [E43] 09/06/2016  . HIV disease ([place]) [B20] 09/06/2016  . DNR (do not resuscitate) discussion [Z71.89]   . AKI (acute kidney injury) ([place]) [N17.9] 09/04/2016  . CVA (cerebral vascular accident) ([place]) [I63.9] 06/26/2016  . Pancytopenia ([place]) [T34.287] 09/30/2015  . 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 Gastric polyp [K31.7]   . Reflux esophagitis [K21.0]   . Elevated LFTs [R94.5] 03/26/2014  . Anemia in chronic kidney disease [N18.9, D63.1] 03/26/2014  . Carotid bruit [R09.89] 12/18/2013  . CKD (chronic kidney disease) [N18.9] 10/23/2013  . Diabetes mellitus type 2 in nonobese ([place]) [E11.9]  10/23/2013  . Essential hypertension, benign [I10] 10/23/2013  . CAD (coronary artery disease) [I25.10] 10/23/2013  . Other and unspecified hyperlipidemia [E78.5] 10/23/2013    Past Medical History Past Medical History:  Diagnosis Date  . AICD (automatic cardioverter/defibrillator) present   . Alcoholism in remission ([place])   . Anemia   . Aortic insufficiency   . Arthritis   . At moderate risk for fall   . Bell's palsy   . Cardiomyopathy ([place])   . Chronic systolic heart failure (HCC)    NYHA Class III  . CKD (chronic kidney disease) stage 3, GFR 30-59 ml/min (HCC)   . COPD (chronic obstructive pulmonary disease) ([place])   . Essential hypertension, benign   . HIV disease ([place]) 09/06/2016  . Hyperlipidemia   . Noncompliance with medication regimen   . Nonischemic cardiomyopathy (HCC)    EF 15-20%  . NSVT (nonsustained ventricular tachycardia) ([place])   . Numbness of right jaw    Had a stoke in 01/2013. Numbness is occasional, especially when trying to chew.  . Productive cough 08/2013   With brown sputum   . SOB (shortness of breath) on exertion 08/2013  . Stroke ([place]) 01/2013   weakness of right side from CVA  . Type 2 diabetes mellitus ([place])   . Uses hearing aid 2014   recently received new hearing aids    Past Surgical History Past Surgical History:  Procedure Laterality Date  . BIOPSY N/A 04/10/2014   Procedure: GASTRIC BIOPSY;  Surgeon: Daneil Dolin, MD;  Location:  [facility];  Service: Endoscopy;  Laterality: N/A;  . BIOPSY  10/12/2015   Procedure: BIOPSY;  Surgeon: Daneil Dolin, MD;  Location: [facility];  Service: Endoscopy;;  stomach bx's  . CARDIAC DEFIBRILLATOR PLACEMENT  11/12/12   [employer] Inogen MINI ICD implanted in [place] at [place] [facility] per Dr Nelly Laurence note  . COLONOSCOPY WITH PROPOFOL N/A 04/10/2014   RMR: Colonic Diverticulosis  . ESOPHAGOGASTRODUODENOSCOPY (EGD) WITH PROPOFOL N/A 04/10/2014    RMR: Mild erosive reflux esophagitis. Multiple antral polyps likely hyperplastic status post removal by hot snare cautery technique. Diffusely abnormal stomach status post gastric biopsy I suspect some of patients anemaia may be due to intermittent oozing from the stomach. It would be difficult and a risky proposition to attempt complete removal of all of his gastric polyps.  . ESOPHAGOGASTRODUODENOSCOPY (EGD) WITH PROPOFOL N/A 10/12/2015   Procedure: ESOPHAGOGASTRODUODENOSCOPY (EGD) WITH PROPOFOL;  Surgeon: Daneil Dolin, MD;  Location: [facility];  Service: Endoscopy;  Laterality: N/A;  9892 - moved to 9:15  . POLYPECTOMY N/A 04/10/2014   Procedure: GASTRIC POLYPECTOMY;  Surgeon: Daneil Dolin, MD;  Location: [facility];  Service: Endoscopy;  Laterality: N/A;  . RIGHT HEART CATHETERIZATION N/A 02/06/2014   Procedure: RIGHT HEART CATH;  Surgeon: Larey Dresser, MD;  Location: [facility] CATH LAB;  Service: Cardiovascular;  Laterality: N/A;    Family History Family History  Problem Relation Age of Onset  . Kidney disease Mother   . Kidney disease Sister   . Colon cancer Neg Hx      Social History  reports that he quit smoking about 23 years ago. His smoking use included cigarettes. he has never used smokeless tobacco. He reports that he drinks alcohol. He reports that he does not use drugs.  Medications  . acetaminophen (TYLENOL) 325 MG tablet Take 2 tablets (650 mg total) by mouth every 4 (four) hours as needed for mild pain (or temp > 37.5 C (99.5 F)).  Marland Kitchen aspirin 325 MG tablet Take 1 tablet (325 mg total) by mouth daily.  Marland Kitchen atorvastatin (LIPITOR) 80 MG tablet Take 80 mg by mouth daily.   . carvedilol (COREG) 25 MG tablet Take 12.5 mg by mouth 2 (two) times daily with a meal.   . Cholecalciferol (VITAMIN D3) 2000 UNITS TABS Take 2,000 mg by mouth daily.  . digoxin (LANOXIN) 0.125 MG tablet TAKE 1/2 TABLET BY MOUTH DAILY  . feeding supplement, ENSURE ENLIVE, (ENSURE ENLIVE) LIQD Take 237 mLs by  mouth 2 (two) times daily between meals.  . ferrous sulfate 325 (65 FE) MG tablet Take 325 mg by mouth daily with breakfast.  . furosemide (LASIX) 40 MG tablet Take 40 mg by mouth daily.  . isosorbide dinitrate (ISORDIL) 20 MG tablet Take 40 mg by mouth 3 (three) times daily.   Marland Kitchen losartan (COZAAR) 25 MG tablet Take 25 mg by mouth daily.  . mirabegron ER (MYRBETRIQ) 25 MG TB24 tablet Take 25 mg by mouth daily.  . mirtazapine (REMERON) 7.5 MG tablet Take 1 tablet by mouth daily.  . Multiple Vitamin (MULTIVITAMIN WITH MINERALS) TABS tablet Take 1 tablet by mouth daily.  . pantoprazole (PROTONIX) 40 MG tablet TAKE ONE TABLET   BY MOUTH   DAILY  . fluconazole (DIFLUCAN) 100 MG tablet Take 1 tablet (100 mg total) by mouth daily.   Allergies Patient has no known allergies.  Review of Systems Review of Systems - Oncology  ROS as per HPI otherwise 12 point ROS is negative.   Physical Exam  Vitals Wt Readings from Last 3 Encounters:  08/10/17 148 lb (67.1 kg)  07/20/17 145 lb 12.8 oz (66.1 kg)  06/12/17 142 lb 9.6 oz (64.7 kg)   Temp Readings from Last 3 Encounters:  08/10/17 98.4 F (36.9 C) (Oral)  07/20/17 98.4 F (36.9 C) (Oral)  06/15/17 98.5 F (36.9 C)   BP Readings from Last 3 Encounters:  08/10/17 (!) 112/56  07/20/17 118/70  06/15/17 108/66   Pulse Readings from Last 3 Encounters:  08/10/17 (!) 59  07/20/17 72  06/15/17 80   Constitutional: Well-developed, well-nourished, and in no distress.   HENT: Head: Normocephalic and atraumatic.  Mouth/Throat: No oropharyngeal exudate. Mucosa moist. Eyes: Pupils are equal, round, and reactive to light. Conjunctivae are normal. No scleral icterus.  Neck: Normal range of motion. Neck supple. No JVD present.  Cardiovascular: Normal rate, regular rhythm and normal heart sounds.  Exam reveals no gallop and no friction rub.   No murmur heard. Pulmonary/Chest: Effort normal and breath sounds normal. No respiratory distress. No  wheezes.No rales.  Abdominal: Soft. Bowel sounds are normal. No distension. There is no tenderness. There is no guarding.  Musculoskeletal: No edema or tenderness.  Lymphadenopathy: No cervical, axillary or supraclavicular adenopathy.  Neurological: Alert and oriented to person, place, and time. No cranial nerve deficit.  Skin: Skin is warm and dry. No rash noted. No erythema. No pallor.  Psychiatric: Affect and judgment normal.   Labs Appointment on 07/26/2017  Component Date Value Ref Range Status  . WBC 07/26/2017 3.1* 4.0 - 10.5 K/uL Final  . RBC 07/26/2017 4.37  4.22 - 5.81 MIL/uL Final  . Hemoglobin 07/26/2017 12.7* 13.0 - 17.0 g/dL Final  . HCT 07/26/2017 39.5  39.0 - 52.0 % Final  . MCV 07/26/2017 90.4  78.0 - 100.0 fL Final  . MCH 07/26/2017 29.1  26.0 - 34.0 pg Final  . MCHC 07/26/2017 32.2  30.0 - 36.0 g/dL Final  . RDW 07/26/2017 13.3  11.5 - 15.5 % Final  . Platelets 07/26/2017 124* 150 - 400 K/uL Final  . Neutrophils Relative % 07/26/2017 62  % Final  . Neutro Abs 07/26/2017 1.9  1.7 - 7.7 K/uL Final  . Lymphocytes Relative 07/26/2017 15  % Final  . Lymphs Abs 07/26/2017 0.5* 0.7 - 4.0 K/uL Final  . Monocytes Relative 07/26/2017 14  % Final  . Monocytes Absolute 07/26/2017 0.4  0.1 - 1.0 K/uL Final  . Eosinophils Relative 07/26/2017 8  % Final  . Eosinophils Absolute 07/26/2017 0.2  0.0 - 0.7 K/uL Final  . Basophils Relative 07/26/2017 1  % Final  . Basophils Absolute 07/26/2017 0.0  0.0 - 0.1 K/uL Final   Performed at [facility], [address], [place], [place] [address]  . Sodium 07/26/2017 133* 135 - 145 mmol/L Final  . Potassium 07/26/2017 4.9  3.5 - 5.1 mmol/L Final  . Chloride 07/26/2017 100* 101 - 111 mmol/L Final  . CO2 07/26/2017 24  22 - 32 mmol/L Final  . Glucose, Bld 07/26/2017 249* 65 - 99 mg/dL Final  . BUN 07/26/2017 62* 6 - 20 mg/dL Final  . Creatinine, Ser 07/26/2017 2.26* 0.61 - 1.24 mg/dL Final  . Calcium 07/26/2017 9.4  8.9 - 10.3 mg/dL Final   . Total Protein 07/26/2017 8.0  6.5 - 8.1 g/dL Final  . Albumin 07/26/2017 3.5  3.5 - 5.0 g/dL Final  . AST 07/26/2017  28  15 - 41 U/L Final  . ALT 07/26/2017 35  17 - 63 U/L Final  . Alkaline Phosphatase 07/26/2017 102  38 - 126 U/L Final  . Total Bilirubin 07/26/2017 0.5  0.3 - 1.2 mg/dL Final  . GFR calc non Af Amer 07/26/2017 29* >60 mL/min Final  . GFR calc Af Amer 07/26/2017 33* >60 mL/min Final   Comment: (NOTE) The eGFR has been calculated using the CKD EPI equation. This calculation has not been validated in all clinical situations. eGFR's persistently <60 mL/min signify possible Chronic Kidney Disease.   Georgiann Hahn gap 07/26/2017 9  5 - 15 Final   Performed at [facility], [address], [place], [place] [address]  . Iron 07/26/2017 52  45 - 182 ug/dL Final  . TIBC 07/26/2017 293  250 - 450 ug/dL Final  . Saturation Ratios 07/26/2017 18  17.9 - 39.5 % Final  . UIBC 07/26/2017 241  ug/dL Final   Performed at [facility], [place] [address], [place], [place] [address]  . Ferritin 07/26/2017 59  24 - 336 ng/mL Final   Performed at [place] [address] [place], [place] [address]  . Vitamin B-12 07/26/2017 1,001* 180 - 914 pg/mL Final   Comment: (NOTE) This assay is not validated for testing neonatal or myeloproliferative syndrome specimens for Vitamin B12 levels. Performed at [facility], [place] [address] [place], [place] [address]   . Folate 07/26/2017 33.0  >5.9 ng/mL Final   Performed at [facility], [place] [address] [place], [place] [address]  Hospital Outpatient Visit on 07/25/2017  Component Date Value Ref Range Status  . WBC 07/25/2017 2.9* 4.0 - 10.5 K/uL Final  . RBC 07/25/2017 4.21* 4.22 - 5.81 MIL/uL Final  . Hemoglobin 07/25/2017 12.1* 13.0 - 17.0 g/dL Final  . HCT 07/25/2017 38.3* 39.0 - 52.0 % Final  . MCV 07/25/2017 91.0  78.0 - 100.0 fL Final  . MCH 07/25/2017 28.7  26.0 - 34.0 pg Final  . MCHC 07/25/2017 31.6  30.0 - 36.0  g/dL Final  . RDW 07/25/2017 13.4  11.5 - 15.5 % Final  . Platelets 07/25/2017 146* 150 - 400 K/uL Final  . Neutrophils Relative % 07/25/2017 58  % Final  . Neutro Abs 07/25/2017 1.6* 1.7 - 7.7 K/uL Final  . Lymphocytes Relative 07/25/2017 16  % Final  . Lymphs Abs 07/25/2017 0.5* 0.7 - 4.0 K/uL Final  . Monocytes Relative 07/25/2017 20  % Final  . Monocytes Absolute 07/25/2017 0.6  0.1 - 1.0 K/uL Final  . Eosinophils Relative 07/25/2017 6  % Final  . Eosinophils Absolute 07/25/2017 0.2  0.0 - 0.7 K/uL Final  . Basophils Relative 07/25/2017 0  % Final  . Basophils Absolute 07/25/2017 0.0  0.0 - 0.1 K/uL Final   Performed at [facility], [address], [place], [place] [address]  . Sodium 07/25/2017 135  135 - 145 mmol/L Final  . Potassium 07/25/2017 4.7  3.5 - 5.1 mmol/L Final  . Chloride 07/25/2017 101  101 - 111 mmol/L Final  . CO2 07/25/2017 25  22 - 32 mmol/L Final  . Glucose, Bld 07/25/2017 155* 65 - 99 mg/dL Final  . BUN 07/25/2017 64* 6 - 20 mg/dL Final  . Creatinine, Ser 07/25/2017 2.43* 0.61 - 1.24 mg/dL Final  . Calcium 07/25/2017 9.3  8.9 - 10.3 mg/dL Final  . Total Protein 07/25/2017 7.3  6.5 - 8.1 g/dL Final  .  Albumin 07/25/2017 3.2* 3.5 - 5.0 g/dL Final  . AST 07/25/2017 25  15 - 41 U/L Final  . ALT 07/25/2017 31  17 - 63 U/L Final  . Alkaline Phosphatase 07/25/2017 95  38 - 126 U/L Final  . Total Bilirubin 07/25/2017 0.5  0.3 - 1.2 mg/dL Final  . GFR calc non Af Amer 07/25/2017 26* >60 mL/min Final  . GFR calc Af Amer 07/25/2017 30* >60 mL/min Final   Comment: (NOTE) The eGFR has been calculated using the CKD EPI equation. This calculation has not been validated in all clinical situations. eGFR's persistently <60 mL/min signify possible Chronic Kidney Disease.   Georgiann Hahn gap 07/25/2017 9  5 - 15 Final   Performed at [facility], [address], [place], [place] [address]  . Iron 07/25/2017 42* 45 - 182 ug/dL Final  . TIBC 07/25/2017 270  250 - 450 ug/dL Final   . Saturation Ratios 07/25/2017 16* 17.9 - 39.5 % Final  . UIBC 07/25/2017 228  ug/dL Final   Performed at [facility], [place] [address] [place], [place] [address]  . Ferritin 07/25/2017 52  24 - 336 ng/mL Final   Performed at [place] [address] [place], [place] [address]  . Folate 07/25/2017 25.6  >5.9 ng/mL Final   Performed at [facility], [place] [address] [place], [place] [address]  . Vitamin B-12 07/25/2017 768  180 - 914 pg/mL Final   Comment: (NOTE) This assay is not validated for testing neonatal or myeloproliferative syndrome specimens for Vitamin B12 levels. Performed at [facility], [place] [address], [place], [place] [address]      Pathology No orders of the defined types were placed in this encounter.      Zoila Shutter MD

## 2017-08-24 ENCOUNTER — Non-Acute Institutional Stay (SKILLED_NURSING_FACILITY): Payer: Medicare HMO | Admitting: Internal Medicine

## 2017-08-24 ENCOUNTER — Encounter: Payer: Self-pay | Admitting: Internal Medicine

## 2017-08-24 DIAGNOSIS — E119 Type 2 diabetes mellitus without complications: Secondary | ICD-10-CM

## 2017-08-24 DIAGNOSIS — M25512 Pain in left shoulder: Secondary | ICD-10-CM | POA: Diagnosis not present

## 2017-08-24 DIAGNOSIS — R238 Other skin changes: Secondary | ICD-10-CM | POA: Diagnosis not present

## 2017-08-24 DIAGNOSIS — I10 Essential (primary) hypertension: Secondary | ICD-10-CM

## 2017-08-24 NOTE — Progress Notes (Signed)
Location:   [place] Room Number: 148/D Place of Service:  SNF [id number]) Provider:  Terisa Starr, MD  Patient Care Team: Sinda Du, MD as PCP - General (Pulmonary Disease) Gala Romney Cristopher Estimable, MD as Consulting Physician (Gastroenterology)  Extended Emergency Contact Information Primary Emergency Contact: [facility] Address: [address] [place] [address] [place] Phone: [phone] Relation: Relative Secondary Emergency Contact: [facility] Address: [address] [place] [address] [place] Phone: [phone] Relation: Sister  Code Status:  DNR Goals of care: Advanced Directive information Advanced Directives 08/24/2017  Does Patient Have a Medical Advance Directive? Yes  Type of Advance Directive Out of facility DNR (pink MOST or yellow form)  Does patient want to make changes to medical advance directive? No - Patient declined  Copy of [place] in Chart? No - copy requested  Would patient like information on creating a medical advance directive? -  Pre-existing out of facility DNR order (yellow form or pink MOST form) -     Chief Complaint  Patient presents with  . Acute Visit    Dry Scalp, Shoulder pain    HPI:  Pt is a 67 y.o. male seen today for an acute visit for follow-up of left shoulder discomfort as well as dry scalp.  Patient is a long-term resident of facility and does well.  When seen recently by Dr. Lyndel Safe for routine visit his only complaint was left shoulder pain.  He was working with OT and PT at the time and they noticed he would wince in pain and they raise his left arm above his shoulder.  Patient was started on Celebrex apparently this is helping somewhat but not relieving the pain.  There is some suspicion this is more of a rotator cuff issue.  He tells me g today is not feeling the pain all the time  but more so when he has significant movement of the arm-says when he is up in the wheelchair not using it is not hurting ---and apparently is not really hurting much when he is immobile in bed  However with significant activity and raising his arm is when he feels.  He does not complain of any recent trauma or fall.  Is been also noted to have some dry scalp and nursing staff is applying topical treatment--he does have a order for Neutrogena twice a week   Vital signs are stable he appears to be feeling relatively well today resting comfortably in bed    his other medical issues include chronic CHF as well as chronic kidney disease HIV on treatment with infectious disease status post AICD-as well as hypertension and history of CVA anemia hyperlipidemia type 2 diabetes and COPD    Past Medical History:  Diagnosis Date  . AICD (automatic cardioverter/defibrillator) present   . Alcoholism in remission ([place])   . Anemia   . Aortic insufficiency   . Arthritis   . At moderate risk for fall   . Bell's palsy   . Cardiomyopathy ([place])   . Chronic systolic heart failure (HCC)    NYHA Class III  . CKD (chronic kidney disease) stage 3, GFR 30-59 ml/min (HCC)   . COPD (chronic obstructive pulmonary disease) ([place])   . Essential hypertension, benign   . HIV disease ([place]) 09/06/2016  . Hyperlipidemia   .  Noncompliance with medication regimen   . Nonischemic cardiomyopathy (HCC)    EF 15-20%  . NSVT (nonsustained ventricular tachycardia) ([place])   . Numbness of right jaw    Had a stoke in 01/2013. Numbness is occasional, especially when trying to chew.  . Productive cough 08/2013   With brown sputum   . SOB (shortness of breath) on exertion 08/2013  . Stroke ([place]) 01/2013   weakness of right side from CVA  . Type 2 diabetes mellitus ([place])   . Uses hearing aid 2014   recently received new hearing aids   Past Surgical History:  Procedure Laterality Date  . BIOPSY N/A 04/10/2014   Procedure:  GASTRIC BIOPSY;  Surgeon: Daneil Dolin, MD;  Location: [facility];  Service: Endoscopy;  Laterality: N/A;  . BIOPSY  10/12/2015   Procedure: BIOPSY;  Surgeon: Daneil Dolin, MD;  [facility];  Service: Endoscopy;;  stomach bx's  . CARDIAC DEFIBRILLATOR PLACEMENT  11/12/12   [employer] Inogen MINI ICD implanted in [place] at [facility] per Dr Nelly Laurence note  . COLONOSCOPY WITH PROPOFOL N/A 04/10/2014   RMR: Colonic Diverticulosis  . ESOPHAGOGASTRODUODENOSCOPY (EGD) WITH PROPOFOL N/A 04/10/2014   RMR: Mild erosive reflux esophagitis. Multiple antral polyps likely hyperplastic status post removal by hot snare cautery technique. Diffusely abnormal stomach status post gastric biopsy I suspect some of patients anemaia may be due to intermittent oozing from the stomach. It would be difficult and a risky proposition to attempt complete removal of all of his gastric polyps.  . ESOPHAGOGASTRODUODENOSCOPY (EGD) WITH PROPOFOL N/A 10/12/2015   Procedure: ESOPHAGOGASTRODUODENOSCOPY (EGD) WITH PROPOFOL;  Surgeon: Daneil Dolin, MD;  [facility];  Service: Endoscopy;  Laterality: N/A;  3220 - moved to 9:15  . POLYPECTOMY N/A 04/10/2014   Procedure: GASTRIC POLYPECTOMY;  Surgeon: Daneil Dolin, MD;  Location: [facility] OR[facility];  Service: Endoscopy;  Laterality: N/A;  . RIGHT HEART CATHETERIZATION N/A 02/06/2014   Procedure: RIGHT HEART CATH;  Surgeon: Larey Dresser, MD;  Location: [facility] CATH LAB;  Service: Cardiovascular;  Laterality: N/A;    No Known Allergies  Outpatient Encounter Medications as of 08/24/2017  Medication Sig  . acetaminophen (TYLENOL) 325 MG tablet Take 2 tablets (650 mg total) by mouth every 4 (four) hours as needed for mild pain (or temp > 37.5 C (99.5 F)).  Marland Kitchen aspirin 325 MG tablet Take 1 tablet (325 mg total) by mouth daily.  Marland Kitchen atorvastatin (LIPITOR) 80 MG tablet Take 80 mg by mouth daily.   Marland Kitchen atovaquone (MEPRON) 750  MG/5ML suspension Take 10 mLs (1,500 mg total) by mouth daily with breakfast.  . carvedilol (COREG) 3.125 MG tablet Take 1 tablet (3.125 mg total) by mouth 2 (two) times daily with a meal.  . Cholecalciferol (VITAMIN D3) 2000 UNITS TABS Take 2,000 mg by mouth daily.  . coal tar (NEUTROGENA T-GEL) 0.5 % shampoo Apply to scalp on shower days (Twice a week) for dandruff and dry scalp Wed., and Sat.  . digoxin (LANOXIN) 0.125 MG tablet TAKE 1/2 TABLET BY MOUTH DAILY  . dolutegravir (TIVICAY) 50 MG tablet Take 1 tablet (50 mg total) by mouth daily.  Marland Kitchen emtricitabine-tenofovir AF (DESCOVY) 200-25 MG tablet Take 1 tablet by mouth daily.  . ferrous sulfate 325 (65 FE) MG tablet Take 325 mg by mouth daily with breakfast.  . furosemide (LASIX) 20 MG tablet Take 20 mg by mouth daily.  Marland Kitchen  isosorbide dinitrate (ISORDIL) 10 MG tablet Take 10 mg by mouth 3 (three) times daily.   . Multiple Vitamin (MULTIVITAMIN WITH MINERALS) TABS tablet Take 1 tablet by mouth daily.  . ranitidine (ZANTAC) 150 MG tablet Take 150 mg by mouth at bedtime.  . sitaGLIPtin (JANUVIA) 50 MG tablet Take 50 mg by mouth daily.  . tamsulosin (FLOMAX) 0.4 MG CAPS capsule Take 0.4 mg by mouth daily.   No facility-administered encounter medications on file as of 08/24/2017.     Review of Systems   In general is not complaining of any fever chills says he feels well.  Skin does not complain of rashes or itching or diaphoresis.-Noted to have some dry flaky scalp  Head ears eyes nose mouth and throat is not complaining of a sore throat or visual changes.  Respiratory does not complain of shortness of breath or cough.  Cardiac does not complain of any chest pain.  Musculoskeletal only complaint is left shoulder pain he says this is intermittent and more associated with movement.  Neurologic does not complain of dizziness headache or numbness or increased weakness from baseline.  Psych does not complain of overt anxiety or depression  continues to be in good spirits  Immunization History  Administered Date(s) Administered  . Hepatitis A, Adult 09/19/2016  . Hepatitis B, adult 09/19/2016  . Influenza-Unspecified 03/13/2015, 07/07/2016, 03/03/2017  . Pneumococcal Conjugate-13 03/03/2017  . Pneumococcal Polysaccharide-23 07/08/2016, 09/19/2016  . Tdap 02/23/2017   Pertinent  Health Maintenance Due  Topic Date Due  . FOOT EXAM  09/24/2017 (Originally 02/10/1961)  . OPHTHALMOLOGY EXAM  09/24/2017 (Originally 02/10/1961)  . URINE MICROALBUMIN  09/24/2017 (Originally 02/10/1961)  . HEMOGLOBIN A1C  11/22/2017  . PNA vac Low Risk Adult (2 of 2 - PPSV23) 09/19/2021  . COLONOSCOPY  04/10/2024   Fall Risk  04/18/2017 02/23/2017 11/29/2016 11/29/2016  Falls in the past year? No No No No   Functional Status Survey:   He is afebrile pulse of 80 respirations of 17 blood pressure 130/76.     Physical Exam   In general this is a somewhat frail elderly male in no distress resting comfortably in bed.  His skin is warm and dry--his scalp does have some dry flakiness present-nursing has asked for topical treatment.  Eyes appear to be reactive to light sclera and conjunctive are clear visual acuity intact.  Oropharynx is clear mucous membranes moist.  Chest is clear to auscultation there is no labored breathing.  Heart is regular rate and rhythm with a systolic murmur   .  He does not have significant lower extremity edema.  GI abdomen is soft does not appear E tender there are positive bowel sounds.  .  Musculoskeletal is able to move all extremities x4 limited exam since he is in bed he is able to raise his left arm however he says it is uncomfortable raising of his right arm did not really elicit pain complaints-I could not really note any deformity of the left shoulder grip strength on the left is strong could not really appreciate significant tenderness to palpation of the left shoulder area.  Neurologic is grossly intact  speech is clear he does not speak much cranial nerves appear to be intact.    Labs reviewed: Recent Labs    09/09/16 0626  02/14/17 0800 02/22/17 1020  06/26/17 0430 07/25/17 0800 07/26/17 1032 08/01/17 0711  NA 142   < > 135 135   < > 136 135 133* 136  K  3.4*   < > 4.3 4.8   < > 4.6 4.7 4.9 4.6  CL 112*   < > 101 98*   < > 101 101 100* 103  CO2 26   < > 27 26   < > 25 25 24 23   GLUCOSE 113*   < > 131* 208*   < > 143* 155* 249* 152*  BUN 92*   < > 67* 67*   < > 54* 64* 62* 51*  CREATININE 2.94*   < > 2.08* 2.32*   < > 2.33* 2.43* 2.26* 2.14*  CALCIUM 7.6*   < > 9.9  10.3* 9.6   < > 9.7 9.3 9.4 9.5  MG 1.6*  --   --   --   --   --   --   --   --   PHOS 2.8   < > 3.3 3.2  --  3.3  --   --   --    < > = values in this interval not displayed.   Recent Labs    02/22/17 1020 06/26/17 0430 07/25/17 0800 07/26/17 1032  AST 49*  --  25 28  ALT 57  --  31 35  ALKPHOS 83  --  95 102  BILITOT 0.8  --  0.5 0.5  PROT 8.0  --  7.3 8.0  ALBUMIN 3.5 3.3* 3.2* 3.5   Recent Labs    06/05/17 0700 07/25/17 0800 07/26/17 1032  WBC 3.0* 2.9* 3.1*  NEUTROABS 1.7 1.6* 1.9  HGB 13.1 12.1* 12.7*  HCT 41.0 38.3* 39.5  MCV 92.6 91.0 90.4  PLT 135* 146* 124*   Lab Results  Component Value Date   TSH 3.661 02/15/2017   Lab Results  Component Value Date   HGBA1C 7.2 (H) 05/24/2017   Lab Results  Component Value Date   CHOL 82 02/15/2017   HDL 28 (L) 02/15/2017   LDLCALC 42 02/15/2017   TRIG 61 02/15/2017   CHOLHDL 2.9 02/15/2017    Significant Diagnostic Results in last 30 days:  No results found.  Assessment/Plan  #1 intermittent left shoulder pain-this is more related to movement and sounds possibly a rotator cuff injury-Celebrex is not apparently helping a whole lot will order an orthopedic consult this was discussed with Dr. Lyndel Safe.  Type 2 diabetes- last hemoglobin A1c was 7.2 back in December 2018 he is been started on Januvia 50 mg a day this appears to be helping  review of blood sugars show them largely in the lower to mid 100s and fairly stable at this point will continue to monitor   #3 hypertension This appears to be stable on Coreg blood pressure today was 130/76 taken manually  #4 dry scalp he is being started on topical treatment as noted above will monitor  [phone]

## 2017-08-25 ENCOUNTER — Telehealth: Payer: Self-pay | Admitting: Orthopedic Surgery

## 2017-08-25 NOTE — Telephone Encounter (Signed)
April 4 3 PM

## 2017-08-25 NOTE — Telephone Encounter (Signed)
Keith Young from [facility] [phone]) called and wanted to schedule an appointment for left shoulder pain.    Will you please review schedule and please advise for an appointment date and time?  Thanks

## 2017-08-28 NOTE — Telephone Encounter (Signed)
I called and spoke to [place] at [facility].  They will  Have Keith Young here on Thursday.

## 2017-08-31 ENCOUNTER — Ambulatory Visit (INDEPENDENT_AMBULATORY_CARE_PROVIDER_SITE_OTHER): Payer: Medicare HMO | Admitting: Orthopedic Surgery

## 2017-08-31 ENCOUNTER — Encounter: Payer: Self-pay | Admitting: Orthopedic Surgery

## 2017-08-31 ENCOUNTER — Inpatient Hospital Stay (INDEPENDENT_AMBULATORY_CARE_PROVIDER_SITE_OTHER): Payer: Medicaid Other

## 2017-08-31 VITALS — BP 149/91 | HR 78 | Resp 16 | Ht 63.0 in

## 2017-08-31 DIAGNOSIS — M75102 Unspecified rotator cuff tear or rupture of left shoulder, not specified as traumatic: Secondary | ICD-10-CM | POA: Diagnosis not present

## 2017-08-31 DIAGNOSIS — M25512 Pain in left shoulder: Secondary | ICD-10-CM

## 2017-08-31 NOTE — Progress Notes (Signed)
NEW PATIENT OFFICE VISIT   Chief Complaint  Patient presents with  . Shoulder Pain    left shoulder pain for several weeks no injury     67 year old male had 1 of the local nursing homes presents with a one-month history of acute onset of left shoulder pain associated with painful forward elevation above his head and above the 90 degree plane.  He denies any associated trauma   Review of Systems  Constitutional: Negative for chills and fever.  Musculoskeletal: Negative for neck pain.  Skin: Negative for rash.  Neurological: Negative for tingling.     Past Medical History:  Diagnosis Date  . AICD (automatic cardioverter/defibrillator) present   . Alcoholism in remission ([place])   . Anemia   . Aortic insufficiency   . Arthritis   . At moderate risk for fall   . Bell's palsy   . Cardiomyopathy ([place])   . Chronic systolic heart failure (HCC)    NYHA Class III  . CKD (chronic kidney disease) stage 3, GFR 30-59 ml/min (HCC)   . COPD (chronic obstructive pulmonary disease) ([place])   . Essential hypertension, benign   . HIV disease ([place]) 09/06/2016  . Hyperlipidemia   . Noncompliance with medication regimen   . Nonischemic cardiomyopathy (HCC)    EF 15-20%  . NSVT (nonsustained ventricular tachycardia) ([place])   . Numbness of right jaw    Had a stoke in 01/2013. Numbness is occasional, especially when trying to chew.  . Productive cough 08/2013   With brown sputum   . SOB (shortness of breath) on exertion 08/2013  . Stroke ([place]) 01/2013   weakness of right side from CVA  . Type 2 diabetes mellitus ([place])   . Uses hearing aid 2014   recently received new hearing aids    Past Surgical History:  Procedure Laterality Date  . BIOPSY N/A 04/10/2014   Procedure: GASTRIC BIOPSY;  Surgeon: Daneil Dolin, MD;  Location: [facility];  Service: Endoscopy;  Laterality: N/A;  . BIOPSY  10/12/2015   Procedure: BIOPSY;  Surgeon: Daneil Dolin, MD;  [facility];  Service: Endoscopy;;   stomach bx's  . CARDIAC DEFIBRILLATOR PLACEMENT  11/12/12   [employer] Inogen MINI ICD implanted in [place] at [place] [facility] per Dr Nelly Laurence note  . COLONOSCOPY WITH PROPOFOL N/A 04/10/2014   RMR: Colonic Diverticulosis  . ESOPHAGOGASTRODUODENOSCOPY (EGD) WITH PROPOFOL N/A 04/10/2014   RMR: Mild erosive reflux esophagitis. Multiple antral polyps likely hyperplastic status post removal by hot snare cautery technique. Diffusely abnormal stomach status post gastric biopsy I suspect some of patients anemaia may be due to intermittent oozing from the stomach. It would be difficult and a risky proposition to attempt complete removal of all of his gastric polyps.  . ESOPHAGOGASTRODUODENOSCOPY (EGD) WITH PROPOFOL N/A 10/12/2015   Procedure: ESOPHAGOGASTRODUODENOSCOPY (EGD) WITH PROPOFOL;  Surgeon: Daneil Dolin, MD;  [facility];  Service: Endoscopy;  Laterality: N/A;  5573 - moved to 9:15  . POLYPECTOMY N/A 04/10/2014   Procedure: GASTRIC POLYPECTOMY;  Surgeon: Daneil Dolin, MD;  Location: [facility];  Service: Endoscopy;  Laterality: N/A;  . RIGHT HEART CATHETERIZATION N/A 02/06/2014   Procedure: RIGHT HEART CATH;  Surgeon: Larey Dresser, MD;  Location: [facility] CATH LAB;  Service: Cardiovascular;  Laterality: N/A;    Family History  Problem Relation Age of Onset  . Kidney disease Mother   . Kidney disease Sister   .  Colon cancer Neg Hx    Social History   Tobacco Use  . Smoking status: Former Smoker    Types: Cigarettes    Last attempt to quit: 11/05/1993    Years since quitting: 23.8  . Smokeless tobacco: Never Used  Substance Use Topics  . Alcohol use: Yes    Comment: former heavy ETOH, none recently  . Drug use: No    Comment: prior history of cocaine use, smoking     Current Meds  Medication Sig  . aspirin 325 MG tablet Take 1 tablet (325 mg total) by mouth daily.  Marland Kitchen atorvastatin (LIPITOR) 80 MG tablet Take 80 mg by  mouth daily.   Marland Kitchen atovaquone (MEPRON) 750 MG/5ML suspension Take 10 mLs (1,500 mg total) by mouth daily with breakfast.  . carvedilol (COREG) 3.125 MG tablet Take 1 tablet (3.125 mg total) by mouth 2 (two) times daily with a meal.  . celecoxib (CELEBREX) 100 MG capsule   . Cholecalciferol (VITAMIN D3) 2000 UNITS TABS Take 2,000 mg by mouth daily.  . coal tar (NEUTROGENA T-GEL) 0.5 % shampoo Apply to scalp on shower days (Twice a week) for dandruff and dry scalp Wed., and Sat.  . digoxin (LANOXIN) 0.125 MG tablet TAKE 1/2 TABLET BY MOUTH DAILY  . dolutegravir (TIVICAY) 50 MG tablet Take 1 tablet (50 mg total) by mouth daily.  Marland Kitchen emtricitabine-tenofovir AF (DESCOVY) 200-25 MG tablet Take 1 tablet by mouth daily.  . ferrous sulfate 325 (65 FE) MG tablet Take 325 mg by mouth daily with breakfast.  . furosemide (LASIX) 20 MG tablet Take 20 mg by mouth daily.  . isosorbide dinitrate (ISORDIL) 10 MG tablet Take 10 mg by mouth 3 (three) times daily.   . Multiple Vitamin (MULTIVITAMIN WITH MINERALS) TABS tablet Take 1 tablet by mouth daily.  . ranitidine (ZANTAC) 150 MG tablet Take 150 mg by mouth at bedtime.  . sitaGLIPtin (JANUVIA) 50 MG tablet Take 50 mg by mouth daily.  . tamsulosin (FLOMAX) 0.4 MG CAPS capsule Take 0.4 mg by mouth daily.    BP (!) 149/91   Pulse 78   Resp 16   Ht 5\' 3"  (1.6 m)   BMI 26.22 kg/m   Physical Exam  Constitutional: He is oriented to person, place, and time. He appears well-developed and well-nourished.  Vital signs have been reviewed and are stable. Gen. appearance the patient is well-developed and well-nourished with normal grooming and hygiene.   Neurological: He is alert and oriented to person, place, and time. Gait abnormal.  Wheelchair ambulation  Skin: Skin is warm and dry. No erythema.  Psychiatric: He has a normal mood and affect.  Vitals reviewed.   Right Shoulder Exam  Right shoulder exam is normal.  Tenderness  The patient is experiencing no  tenderness.  Range of Motion  The patient has normal right shoulder ROM. External rotation: 20   Muscle Strength  The patient has normal right shoulder strength.  Tests  Apprehension: negative Impingement: negative Drop arm: negative  Other  Erythema: absent Sensation: normal Pulse: present   Left Shoulder Exam  Left shoulder exam is normal.  Tenderness  The patient is experiencing tenderness in the acromion.  Range of Motion  The patient has normal left shoulder ROM. Active abduction: 90  Passive abduction: 110  External rotation: 20   Muscle Strength  The patient has normal left shoulder strength.  Tests  Apprehension: negative Impingement: positive  Other  Erythema: absent Sensation: normal Pulse: present  MEDICAL DECISION SECTION  xrays ordered? yes  My independent reading of xrays: Type III acromion.  No glenohumeral arthritis.  Defibrillator present.  Arm is internally rotated.  No evidence of dislocation posterior or anterior   Encounter Diagnoses  Name Primary?  . Pain in joint of left shoulder Yes  . Rotator cuff syndrome of left shoulder      PLAN:   No orders of the defined types were placed in this encounter.  Injection? Yes Procedure note the subacromial injection shoulder left   Verbal consent was obtained to inject the  Left   Shoulder  Timeout was completed to confirm the injection site is a subacromial space of the  left  shoulder  Medication used Depo-Medrol 40 mg and lidocaine 1% 3 cc  Anesthesia was provided by ethyl chloride  The injection was performed in the left  posterior subacromial space. After pinning the skin with alcohol and anesthetized the skin with ethyl chloride the subacromial space was injected using a 20-gauge needle. There were no complications  Sterile dressing was applied.  Recommend physical therapy   Naprosyn 500 mg twice a day  No follow-up needed

## 2017-09-22 ENCOUNTER — Encounter: Payer: Self-pay | Admitting: Internal Medicine

## 2017-09-22 ENCOUNTER — Non-Acute Institutional Stay (SKILLED_NURSING_FACILITY): Payer: Medicare HMO | Admitting: Internal Medicine

## 2017-09-22 DIAGNOSIS — N189 Chronic kidney disease, unspecified: Secondary | ICD-10-CM | POA: Diagnosis not present

## 2017-09-22 DIAGNOSIS — I2510 Atherosclerotic heart disease of native coronary artery without angina pectoris: Secondary | ICD-10-CM

## 2017-09-22 DIAGNOSIS — D631 Anemia in chronic kidney disease: Secondary | ICD-10-CM | POA: Diagnosis not present

## 2017-09-22 DIAGNOSIS — N183 Chronic kidney disease, stage 3 unspecified: Secondary | ICD-10-CM

## 2017-09-22 DIAGNOSIS — B20 Human immunodeficiency virus [HIV] disease: Secondary | ICD-10-CM | POA: Diagnosis not present

## 2017-09-22 DIAGNOSIS — E119 Type 2 diabetes mellitus without complications: Secondary | ICD-10-CM

## 2017-09-22 DIAGNOSIS — I5042 Chronic combined systolic (congestive) and diastolic (congestive) heart failure: Secondary | ICD-10-CM | POA: Diagnosis not present

## 2017-09-22 NOTE — Progress Notes (Signed)
Location:   [facility] Room Number: 148/D Place of Service:  SNF (31) Provider:  Terisa Starr, MD  Patient Care Team: Sinda Du, MD as PCP - General (Pulmonary Disease) Gala Romney Cristopher Estimable, MD as Consulting Physician (Gastroenterology)  Extended Emergency Contact Information Primary Emergency Contact: [facility] Address: [address] [place] [address] [place] Phone: [phone] Relation: Relative Secondary Emergency Contact: [facility] Address: [address], [place] [address] [place] Phone: [phone] Relation: Sister  Code Status:  DNR Goals of care: Advanced Directive information Advanced Directives 09/22/2017  Does Patient Have a Medical Advance Directive? Yes  Type of Advance Directive Out of facility DNR (pink MOST or yellow form)  Does patient want to make changes to medical advance directive? No - Patient declined  Copy of [place] in Chart? No - copy requested  Would patient like information on creating a medical advance directive? -  Pre-existing out of facility DNR order (yellow form or pink MOST form) -     Chief Complaint  Patient presents with  . Medical Management of Chronic Issues    Routine Visit  For medical management of chronic medical conditions including combined systolic and diastolic CHF-chronic kidney disease- hypertension-coronary artery disease status post AICD- history of CVA as well as anemia because of chronic kidney disease-hyperlipidemia-type 2 diabetes- as well as history of urinary retention as noted above.    HPI:  Pt is a 67 y.o. male seen today for medical management of chronic diseases.  Nursing does not report any recent acute issues-he has gained about 6 pounds since last month apparently is eating fairly  well I do not see any increased edema or complaints of shortness of breath.  In  regards to congestive heart failure Atovaquone He does have a listed ejection fraction of 30% on echo done in July 2018.  His weight gain appears to be appetite related he appears to be asymptomatic he is on low-dose Lasix he is also on a beta-blocker as well as digoxin.  In regards to HIV he followed with infectious disease continues on Atovaquone as well as Descovy-- He appears to be stable in this regards.  He does have a history of type 2 diabetes and was recently started on Januvia and this appears to be helping blood sugars appear to be largely in the lower to mid 100s-last hemoglobin A1c was 7.2 back in December will update this.  In regards to hypertension he is on Coreg I got a manual reading of 974/16 today she elicited readings with systolic in the low 384T at this point will monitor.  Regards chronic kidney disease he is followed by nephrology last creatinine was 2.14 last month which appears relatively baseline he also has associated anemia which is been stable at 12.7 on most recent lab recommendation is not to start Aranesp if hemoglobin is greater than 11.  Most acute issue actually was some left shoulder pain he complained of the knee did see orthopedics and received a steroid injection he says this has helped significantly.  He is complaining of some mild left knee pain today that is essentially his only complaint.  Regards to history of CVA he is on aspirin and a statin LDL was 42 on lab done in September of last year will update this.  He  also has a history of urinary retention and at one point had a catheter-this has since been discontinued he has been started on Flomax and appears that this is helping.      Past Medical History:  Diagnosis Date  . AICD (automatic cardioverter/defibrillator) present   . Alcoholism in remission ([place])   . Anemia   . Aortic insufficiency   . Arthritis   . At moderate risk for fall   . Bell's palsy   . Cardiomyopathy ([place])   .  Chronic systolic heart failure (HCC)    NYHA Class III  . CKD (chronic kidney disease) stage 3, GFR 30-59 ml/min (HCC)   . COPD (chronic obstructive pulmonary disease) ([place])   . Essential hypertension, benign   . HIV disease ([place]) 09/06/2016  . Hyperlipidemia   . Noncompliance with medication regimen   . Nonischemic cardiomyopathy (HCC)    EF 15-20%  . NSVT (nonsustained ventricular tachycardia) ([place])   . Numbness of right jaw    Had a stoke in 01/2013. Numbness is occasional, especially when trying to chew.  . Productive cough 08/2013   With brown sputum   . SOB (shortness of breath) on exertion 08/2013  . Stroke ([place]) 01/2013   weakness of right side from CVA  . Type 2 diabetes mellitus ([place])   . Uses hearing aid 2014   recently received new hearing aids   Past Surgical History:  Procedure Laterality Date  . BIOPSY N/A 04/10/2014   Procedure: GASTRIC BIOPSY;  Surgeon: Daneil Dolin, MD;  Location: [facility] ORS;  Service: Endoscopy;  Laterality: N/A;  . BIOPSY  10/12/2015   Procedure: BIOPSY;  Surgeon: Daneil Dolin, MD;  Location: [facility] END[facility] SU[facility];  Service: Endoscopy;;  stomach bx's  . CARDIAC DEFIBRILLATOR PLACEMENT  11/12/12   [employer] Inogen MINI ICD implanted in [place] at [facility] per Dr Nelly Laurence note  . COLONOSCOPY WITH PROPOFOL N/A 04/10/2014   RMR: Colonic Diverticulosis  . ESOPHAGOGASTRODUODENOSCOPY (EGD) WITH PROPOFOL N/A 04/10/2014   RMR: Mild erosive reflux esophagitis. Multiple antral polyps likely hyperplastic status post removal by hot snare cautery technique. Diffusely abnormal stomach status post gastric biopsy I suspect some of patients anemaia may be due to intermittent oozing from the stomach. It would be difficult and a risky proposition to attempt complete removal of all of his gastric polyps.  . ESOPHAGOGASTRODUODENOSCOPY (EGD) WITH PROPOFOL N/A 10/12/2015   Procedure: ESOPHAGOGASTRODUODENOSCOPY (EGD)  WITH PROPOFOL;  Surgeon: Daneil Dolin, MD;  Location: [facility];  Service: Endoscopy;  Laterality: N/A;  8144 - moved to 9:15  . POLYPECTOMY N/A 04/10/2014   Procedure: GASTRIC POLYPECTOMY;  Surgeon: Daneil Dolin, MD;  Location: [facility];  Service: Endoscopy;  Laterality: N/A;  . RIGHT HEART CATHETERIZATION N/A 02/06/2014   Procedure: RIGHT HEART CATH;  Surgeon: Larey Dresser, MD;  Location: [facility] CATH LAB;  Service: Cardiovascular;  Laterality: N/A;    No Known Allergies  Outpatient Encounter Medications as of 09/22/2017  Medication Sig  . acetaminophen (TYLENOL) 325 MG tablet Take 2 tablets (650 mg total) by mouth every 4 (four) hours as needed for mild pain (or temp > 37.5 C (99.5 F)).  Marland Kitchen aspirin 325 MG tablet Take 1 tablet (325 mg total) by mouth daily.  Marland Kitchen atorvastatin (LIPITOR) 80 MG tablet Take 80 mg by mouth daily.   Marland Kitchen atovaquone (MEPRON) 750 MG/5ML suspension Take 10 mLs (1,500 mg total) by mouth  daily with breakfast.  . carvedilol (COREG) 3.125 MG tablet Take 1 tablet (3.125 mg total) by mouth 2 (two) times daily with a meal.  . Cholecalciferol (VITAMIN D3) 2000 UNITS TABS Take 2,000 mg by mouth daily.  . coal tar (NEUTROGENA T-GEL) 0.5 % shampoo Apply to scalp on shower days (Twice a week) for dandruff and dry scalp Wed., and Sat.  . digoxin (LANOXIN) 0.125 MG tablet TAKE 1/2 TABLET BY MOUTH DAILY  . dolutegravir (TIVICAY) 50 MG tablet Take 1 tablet (50 mg total) by mouth daily.  Marland Kitchen emtricitabine-tenofovir AF (DESCOVY) 200-25 MG tablet Take 1 tablet by mouth daily.  . ferrous sulfate 325 (65 FE) MG tablet Take 325 mg by mouth daily with breakfast.  . furosemide (LASIX) 20 MG tablet Take 20 mg by mouth daily.  . isosorbide dinitrate (ISORDIL) 10 MG tablet Take 10 mg by mouth 3 (three) times daily.   . Multiple Vitamin (MULTIVITAMIN WITH MINERALS) TABS tablet Take 1 tablet by mouth daily.  . ranitidine (ZANTAC) 150 MG tablet Take 150 mg by mouth at bedtime.  . sitaGLIPtin  (JANUVIA) 50 MG tablet Take 50 mg by mouth daily.  . tamsulosin (FLOMAX) 0.4 MG CAPS capsule Take 0.4 mg by mouth daily.  . [DISCONTINUED] celecoxib (CELEBREX) 100 MG capsule    No facility-administered encounter medications on file as of 09/22/2017.      Review of Systems   In general is not complaining of any fever or chills he is gained some weight but this appears to be appetite related.  Skin is not complaining of rashes or itching.  Head ears eyes nose mouth and throat is not complaining of any sore throat or visual changes.  Respiratory is not complaining of shortness of breath or cough does have a listed history of COPD but this appears to be stable has been for some time.  Cardiac is not complaining of chest pain or lower extremity edema.  GI is not complaining of abdominal discomfort nausea vomiting diarrhea constipation is has a good appetite.  GU does not complain of dysuria-nursing has not noted any recent urinary retention.  Musculoskeletal is complaining of some mild left knee pain that is intermittent says his left shoulder pain is improved since the steroid injection.  Neurologic is not complaining of dizziness headache or numbness or syncope.  Psych does not complain of being depressed or anxious.    Immunization History  Administered Date(s) Administered  . Hepatitis A, Adult 09/19/2016  . Hepatitis B, adult 09/19/2016  . Influenza-Unspecified 03/13/2015, 07/07/2016, 03/03/2017  . Pneumococcal Conjugate-13 03/03/2017  . Pneumococcal Polysaccharide-23 07/08/2016, 09/19/2016  . Tdap 02/23/2017   Pertinent  Health Maintenance Due  Topic Date Due  . FOOT EXAM  09/24/2017 (Originally 02/10/1961)  . OPHTHALMOLOGY EXAM  09/24/2017 (Originally 02/10/1961)  . URINE MICROALBUMIN  09/24/2017 (Originally 02/10/1961)  . HEMOGLOBIN A1C  11/22/2017  . PNA vac Low Risk Adult (2 of 2 - PPSV23) 09/19/2021  . COLONOSCOPY  04/10/2024   Fall Risk  04/18/2017 02/23/2017  11/29/2016 11/29/2016  Falls in the past year? No No No No   Functional Status Survey:    Vitals:   09/22/17 1042  BP: (!) 142/83  Pulse: 85  Resp: 18  Temp: 98 F (36.7 C)  TempSrc: Oral  SpO2: 94%  Weight: 154 lb (69.9 kg)  Height: 5\' 3"  (1.6 m)  Manual blood pressure was 136/80 Body mass index is 27.28 kg/m. Physical Exam   In general this is a pleasant  elderly male in no distress sitting comfortably in his chair.  His skin is warm and dry.  Eyes visual acuity appears grossly intact sclera and conjunctivae are clear.  Oropharynx he appears to have a small amount of food residue on his tongue mucous membranes are moist.  Chest is clear to auscultation there is no labored breathing.  There is somewhat shallow air entry.  Heart is regular rate and rhythm with a systolic murmur at baseline he does not have significant lower extremity edema.  Abdomen is soft nontender with positive bowel sounds.  GU could not appreciate suprapubic tenderness or distention.  Musculoskeletal has general frailty but moves all extremities x4 could not really appreciate any true lateralizing findings.  Pain to palpation of the left knee however with flexion and extension he said he did have some discomfort.  He has limited range of motion of the shoulders bilaterally I cannot note any deformities of either shoulder  Neurologic is grossly intact he is alert cranial nerves appear to be grossly intact his speech is clear he is speaking a bit more than he did last time I visited.  Psych he is oriented to self pleasant appropriate answers straightforward questions appropriately he does have arthritic changes of his knees bilaterally but cannot really appreciate      Labs reviewed: Recent Labs    02/14/17 0800 02/22/17 1020  06/26/17 0430 07/25/17 0800 07/26/17 1032 08/01/17 0711  NA 135 135   < > 136 135 133* 136  K 4.3 4.8   < > 4.6 4.7 4.9 4.6  CL 101 98*   < > 101 101 100* 103    CO2 27 26   < > 25 25 24 23   GLUCOSE 131* 208*   < > 143* 155* 249* 152*  BUN 67* 67*   < > 54* 64* 62* 51*  CREATININE 2.08* 2.32*   < > 2.33* 2.43* 2.26* 2.14*  CALCIUM 9.9  10.3* 9.6   < > 9.7 9.3 9.4 9.5  PHOS 3.3 3.2  --  3.3  --   --   --    < > = values in this interval not displayed.   Recent Labs    02/22/17 1020 06/26/17 0430 07/25/17 0800 07/26/17 1032  AST 49*  --  25 28  ALT 57  --  31 35  ALKPHOS 83  --  95 102  BILITOT 0.8  --  0.5 0.5  PROT 8.0  --  7.3 8.0  ALBUMIN 3.5 3.3* 3.2* 3.5   Recent Labs    06/05/17 0700 07/25/17 0800 07/26/17 1032  WBC 3.0* 2.9* 3.1*  NEUTROABS 1.7 1.6* 1.9  HGB 13.1 12.1* 12.7*  HCT 41.0 38.3* 39.5  MCV 92.6 91.0 90.4  PLT 135* 146* 124*   Lab Results  Component Value Date   TSH 3.661 02/15/2017   Lab Results  Component Value Date   HGBA1C 7.2 (H) 05/24/2017   Lab Results  Component Value Date   CHOL 82 02/15/2017   HDL 28 (L) 02/15/2017   LDLCALC 42 02/15/2017   TRIG 61 02/15/2017   CHOLHDL 2.9 02/15/2017    Significant Diagnostic Results in last 30 days:  Dg Shoulder Left  Result Date: 09/04/2017 [facility] report Dictated by Dr. Aline Brochure Chief complaint pain left shoulder A pacemaker is seen in the left chest wall area.  This obscures some of the glenohumeral joint however, there is no significant narrowing there.  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 However on the lateral view there is no evidence of dislocation. The acromion is best described as type II Impression normal glenohumeral joint type II acromion internally rotated arm without dislocation    Assessment/Plan  #1- history CHF--combined diastolic-he has gained weight but this appears to be more food intake related I do not see evidence of edema-- no complaints of chest pain or shortness of breath- he is on Lasix as well as Coreg and digoxin will update a digoxin level as well as a BMP to keep an eye on his electrolytes  and renal function-continue to monitor weights and clinical status.  .  2.  History of chronic kidney disease he is followed by nephrology creatinine appears relatively baseline at 2.14 again will update a BMP.  3.  History of HIV he is followed by infectious disease and continues on current medications as noted above- he appears to be asymptomatic currently labs are followed by infectious disease and appear to be stable.  4.  History of left shoulder pain this has been evaluated by orthopedics and did receive a steroid injection which apparently has helped significantly.  5.  History of anemia most likely due to chronic kidney disease this is been stable with a hemoglobin of 12.7 will update this as well not thought to be a good candidate for Aranesp unless hemoglobin drops below 11-he is on iron.  He also has chronic pancytopenia which appears relatively stable last platelet count was 124,000  6.  History of CVA he appears to be doing well with supportive care he is on aspirin 325 mg a day as well as a statin LDL was 42 on September lab-will update this.  7.  History of hypertension he is on Coreg I got a blood pressure 136/80 today-previous reading was 500 systolically at this point will monitor for any consistent elevations.  8.  History of type 2 diabetes he has been started on Januvia blood sugars appear to be largely in the lower mid 100s hemoglobin A1c most recently was 7.2 will have this updated.  9.  History of urinary retention he no longer has a catheter nonetheless this appears to have stabilized on Flomax.  10.  History of left knee discomfort will update an x-ray at this point he appears to not be in any acute discomfort here this will need to be monitored.  He does have Tylenol as needed for pain.  11.-History of coronary artery disease continues with an AICD he is also on aspirin and a statin as well as a beta-blocker this appears to be stable  [id number]-of note greater  than 40 minutes spent assessing patient-reviewing his chart labs- discussing his status with nursing staff- and coordinating and formulating a plan of care for numerous diagnoses- of note greater than 50% of time spent coordinating a plan of care

## 2017-09-25 ENCOUNTER — Encounter (HOSPITAL_COMMUNITY)
Admission: RE | Admit: 2017-09-25 | Discharge: 2017-09-25 | Disposition: A | Discharge status: Home / Self Care | Payer: Medicare HMO | Source: Skilled Nursing Facility | Attending: Internal Medicine | Admitting: Internal Medicine

## 2017-09-25 DIAGNOSIS — R1312 Dysphagia, oropharyngeal phase: Secondary | ICD-10-CM | POA: Insufficient documentation

## 2017-09-25 DIAGNOSIS — I5022 Chronic systolic (congestive) heart failure: Secondary | ICD-10-CM | POA: Diagnosis present

## 2017-09-25 DIAGNOSIS — I480 Paroxysmal atrial fibrillation: Secondary | ICD-10-CM | POA: Insufficient documentation

## 2017-09-25 DIAGNOSIS — I639 Cerebral infarction, unspecified: Secondary | ICD-10-CM | POA: Insufficient documentation

## 2017-09-25 DIAGNOSIS — Z9181 History of falling: Secondary | ICD-10-CM | POA: Diagnosis not present

## 2017-09-25 DIAGNOSIS — I110 Hypertensive heart disease with heart failure: Secondary | ICD-10-CM | POA: Diagnosis not present

## 2017-09-25 DIAGNOSIS — N3281 Overactive bladder: Secondary | ICD-10-CM | POA: Insufficient documentation

## 2017-09-25 LAB — LIPID PANEL
CHOLESTEROL: 73 mg/dL (ref 0–200)
HDL: 26 mg/dL — ABNORMAL LOW (ref >40)
LDL Cholesterol: 27 mg/dL (ref 0–99)
TRIGLYCERIDES: 101 mg/dL (ref <150)
Total CHOL/HDL Ratio: 2.8 RATIO
VLDL: 20 mg/dL (ref 0–40)

## 2017-09-25 LAB — CBC WITH DIFFERENTIAL/PLATELET
BASOS ABS: 0 10*3/uL (ref 0.0–0.1)
BASOS PCT: 1 %
EOS ABS: 0.4 10*3/uL (ref 0.0–0.7)
Eosinophils Relative: 10 %
HCT: 44.9 % (ref 39.0–52.0)
Hemoglobin: 14 g/dL (ref 13.0–17.0)
LYMPHS ABS: 0.7 10*3/uL (ref 0.7–4.0)
Lymphocytes Relative: 18 %
MCH: 28.9 pg (ref 26.0–34.0)
MCHC: 31.2 g/dL (ref 30.0–36.0)
MCV: 92.6 fL (ref 78.0–100.0)
Monocytes Absolute: 0.6 10*3/uL (ref 0.1–1.0)
Monocytes Relative: 16 %
Neutro Abs: 2.1 10*3/uL (ref 1.7–7.7)
Neutrophils Relative %: 57 %
PLATELETS: 135 10*3/uL — AB (ref 150–400)
RBC: 4.85 MIL/uL (ref 4.22–5.81)
RDW: 14.4 % (ref 11.5–15.5)
WBC: 3.7 10*3/uL — AB (ref 4.0–10.5)

## 2017-09-25 LAB — BASIC METABOLIC PANEL
ANION GAP: 9 (ref 5–15)
BUN: 50 mg/dL — ABNORMAL HIGH (ref 6–20)
CALCIUM: 9.6 mg/dL (ref 8.9–10.3)
CO2: 23 mmol/L (ref 22–32)
Chloride: 106 mmol/L (ref 101–111)
Creatinine, Ser: 2.33 mg/dL — ABNORMAL HIGH (ref 0.61–1.24)
GFR calc Af Amer: 32 mL/min — ABNORMAL LOW (ref >60)
GFR, EST NON AFRICAN AMERICAN: 28 mL/min — AB (ref >60)
GLUCOSE: 130 mg/dL — AB (ref 65–99)
POTASSIUM: 4.9 mmol/L (ref 3.5–5.1)
SODIUM: 138 mmol/L (ref 135–145)

## 2017-09-25 LAB — HEMOGLOBIN A1C
HEMOGLOBIN A1C: 7.3 % — AB (ref 4.8–5.6)
Mean Plasma Glucose: 162.81 mg/dL

## 2017-09-25 LAB — DIGOXIN LEVEL: DIGOXIN LVL: 0.8 ng/mL (ref 0.8–2.0)

## 2017-10-16 ENCOUNTER — Ambulatory Visit (INDEPENDENT_AMBULATORY_CARE_PROVIDER_SITE_OTHER): Payer: Medicare HMO | Admitting: Internal Medicine

## 2017-10-16 ENCOUNTER — Encounter: Payer: Self-pay | Admitting: Internal Medicine

## 2017-10-16 VITALS — BP 124/77 | HR 86 | Temp 98.6°F

## 2017-10-16 DIAGNOSIS — Z113 Encounter for screening for infections with a predominantly sexual mode of transmission: Secondary | ICD-10-CM

## 2017-10-16 DIAGNOSIS — N183 Chronic kidney disease, stage 3 unspecified: Secondary | ICD-10-CM

## 2017-10-16 DIAGNOSIS — B20 Human immunodeficiency virus [HIV] disease: Secondary | ICD-10-CM

## 2017-10-16 NOTE — Assessment & Plan Note (Signed)
Stable creat and no dose adjustment indicated.

## 2017-10-16 NOTE — Progress Notes (Signed)
   Subjective:    Patient ID: Keith Young, male    DOB: 23-Sep-1950, 67 y.o.   MRN: [id number]  HPI Here for follow up of HIV  Doing well with continued good weight gain and eating well.  He denies any missed doses and medicaiton is provided to him by the [facility].  He does not have any complaints.  Creat remains 2 - 2.3.  Taking Mepron as well for OI prophylaxis still.  No associated n/v/d.     Review of Systems  HENT: Negative for trouble swallowing.   Gastrointestinal: Negative for diarrhea.  Skin: Negative for rash.       Objective:   Physical Exam  Constitutional:  Healthier appearing  HENT:  Mouth/Throat: No oropharyngeal exudate.  Eyes: No scleral icterus.  Cardiovascular: Normal rate, regular rhythm and normal heart sounds.  Pulmonary/Chest: Effort normal and breath sounds normal. No respiratory distress.  Lymphadenopathy:    He has no cervical adenopathy.  Skin: No rash noted.   SH: lives at the [facility]:

## 2017-10-16 NOTE — Assessment & Plan Note (Signed)
Will screen today 

## 2017-10-16 NOTE — Assessment & Plan Note (Signed)
Doing well.  Labs today and rtc 6 months.

## 2017-10-17 LAB — RPR: RPR: NONREACTIVE

## 2017-10-17 LAB — T-HELPER CELL (CD4) - (RCID CLINIC ONLY)
CD4 % Helper T Cell: 36 % (ref 33–55)
CD4 T CELL ABS: 210 /uL — AB (ref 400–2700)

## 2017-10-18 LAB — HIV-1 RNA QUANT-NO REFLEX-BLD
HIV 1 RNA Quant: 46 copies/mL — ABNORMAL HIGH
HIV-1 RNA QUANT, LOG: 1.66 {Log_copies}/mL — AB

## 2017-10-25 ENCOUNTER — Encounter (HOSPITAL_COMMUNITY): Payer: Self-pay | Admitting: Hematology

## 2017-10-25 ENCOUNTER — Inpatient Hospital Stay (HOSPITAL_COMMUNITY): Payer: Medicare HMO | Attending: Hematology

## 2017-10-25 ENCOUNTER — Inpatient Hospital Stay (HOSPITAL_COMMUNITY): Payer: Medicare HMO | Attending: Internal Medicine | Admitting: Hematology

## 2017-10-25 VITALS — BP 134/81 | HR 74 | Temp 97.6°F | Resp 18 | Wt 157.0 lb

## 2017-10-25 DIAGNOSIS — E1122 Type 2 diabetes mellitus with diabetic chronic kidney disease: Secondary | ICD-10-CM | POA: Insufficient documentation

## 2017-10-25 DIAGNOSIS — B20 Human immunodeficiency virus [HIV] disease: Secondary | ICD-10-CM | POA: Insufficient documentation

## 2017-10-25 DIAGNOSIS — Z8673 Personal history of transient ischemic attack (TIA), and cerebral infarction without residual deficits: Secondary | ICD-10-CM

## 2017-10-25 DIAGNOSIS — Z95810 Presence of automatic (implantable) cardiac defibrillator: Secondary | ICD-10-CM | POA: Insufficient documentation

## 2017-10-25 DIAGNOSIS — D509 Iron deficiency anemia, unspecified: Secondary | ICD-10-CM | POA: Insufficient documentation

## 2017-10-25 DIAGNOSIS — D631 Anemia in chronic kidney disease: Secondary | ICD-10-CM | POA: Insufficient documentation

## 2017-10-25 DIAGNOSIS — Z87891 Personal history of nicotine dependence: Secondary | ICD-10-CM | POA: Diagnosis not present

## 2017-10-25 DIAGNOSIS — I5042 Chronic combined systolic (congestive) and diastolic (congestive) heart failure: Secondary | ICD-10-CM | POA: Diagnosis not present

## 2017-10-25 DIAGNOSIS — N189 Chronic kidney disease, unspecified: Secondary | ICD-10-CM

## 2017-10-25 DIAGNOSIS — R5383 Other fatigue: Secondary | ICD-10-CM | POA: Insufficient documentation

## 2017-10-25 DIAGNOSIS — I2510 Atherosclerotic heart disease of native coronary artery without angina pectoris: Secondary | ICD-10-CM | POA: Diagnosis not present

## 2017-10-25 DIAGNOSIS — N183 Chronic kidney disease, stage 3 (moderate): Secondary | ICD-10-CM | POA: Insufficient documentation

## 2017-10-25 DIAGNOSIS — Z800 Family history of malignant neoplasm of digestive organs: Secondary | ICD-10-CM | POA: Diagnosis not present

## 2017-10-25 DIAGNOSIS — I130 Hypertensive heart and chronic kidney disease with heart failure and stage 1 through stage 4 chronic kidney disease, or unspecified chronic kidney disease: Secondary | ICD-10-CM | POA: Diagnosis present

## 2017-10-25 DIAGNOSIS — E785 Hyperlipidemia, unspecified: Secondary | ICD-10-CM

## 2017-10-25 DIAGNOSIS — J449 Chronic obstructive pulmonary disease, unspecified: Secondary | ICD-10-CM | POA: Diagnosis not present

## 2017-10-25 DIAGNOSIS — Z7982 Long term (current) use of aspirin: Secondary | ICD-10-CM

## 2017-10-25 DIAGNOSIS — D72810 Lymphocytopenia: Secondary | ICD-10-CM

## 2017-10-25 DIAGNOSIS — K219 Gastro-esophageal reflux disease without esophagitis: Secondary | ICD-10-CM | POA: Insufficient documentation

## 2017-10-25 DIAGNOSIS — Z79899 Other long term (current) drug therapy: Secondary | ICD-10-CM | POA: Insufficient documentation

## 2017-10-25 LAB — CBC WITH DIFFERENTIAL/PLATELET
BASOS PCT: 1 %
Basophils Absolute: 0 10*3/uL (ref 0.0–0.1)
EOS ABS: 0.4 10*3/uL (ref 0.0–0.7)
Eosinophils Relative: 10 %
HEMATOCRIT: 40.9 % (ref 39.0–52.0)
Hemoglobin: 13.2 g/dL (ref 13.0–17.0)
Lymphocytes Relative: 13 %
Lymphs Abs: 0.5 10*3/uL — ABNORMAL LOW (ref 0.7–4.0)
MCH: 29.3 pg (ref 26.0–34.0)
MCHC: 32.3 g/dL (ref 30.0–36.0)
MCV: 90.9 fL (ref 78.0–100.0)
MONO ABS: 0.8 10*3/uL (ref 0.1–1.0)
MONOS PCT: 20 %
NEUTROS ABS: 2.3 10*3/uL (ref 1.7–7.7)
NEUTROS PCT: 56 %
PLATELETS: 153 10*3/uL (ref 150–400)
RBC: 4.5 MIL/uL (ref 4.22–5.81)
RDW: 13.4 % (ref 11.5–15.5)
WBC: 4 10*3/uL (ref 4.0–10.5)

## 2017-10-25 LAB — COMPREHENSIVE METABOLIC PANEL
ALBUMIN: 3.4 g/dL — AB (ref 3.5–5.0)
ALT: 46 U/L (ref 17–63)
ANION GAP: 8 (ref 5–15)
AST: 29 U/L (ref 15–41)
Alkaline Phosphatase: 92 U/L (ref 38–126)
BILIRUBIN TOTAL: 0.7 mg/dL (ref 0.3–1.2)
BUN: 57 mg/dL — AB (ref 6–20)
CHLORIDE: 100 mmol/L — AB (ref 101–111)
CO2: 27 mmol/L (ref 22–32)
Calcium: 9.4 mg/dL (ref 8.9–10.3)
Creatinine, Ser: 2.64 mg/dL — ABNORMAL HIGH (ref 0.61–1.24)
GFR calc Af Amer: 28 mL/min — ABNORMAL LOW (ref >60)
GFR calc non Af Amer: 24 mL/min — ABNORMAL LOW (ref >60)
GLUCOSE: 194 mg/dL — AB (ref 65–99)
POTASSIUM: 4.7 mmol/L (ref 3.5–5.1)
SODIUM: 135 mmol/L (ref 135–145)
TOTAL PROTEIN: 8.1 g/dL (ref 6.5–8.1)

## 2017-10-25 LAB — LACTATE DEHYDROGENASE: LDH: 227 U/L — ABNORMAL HIGH (ref 98–192)

## 2017-10-25 NOTE — Progress Notes (Signed)
[place], [place] [address]   CLINIC:  Medical Oncology/Hematology  PCP:  Sinda Du, MD [place] [address] [phone]   REASON FOR VISIT:  Follow-up for anemia from chronic kidney disease.  CURRENT THERAPY: Intermittent Aranesp injection.   INTERVAL HISTORY:  Keith Young 67 y.o. male returns for follow-up of his anemia.  He denies any fevers, night sweats or weight loss.  He denies any recent hospitalizations.  Denies any bleeding per rectum or melena.   REVIEW OF SYSTEMS:  Review of Systems  Constitutional: Positive for fatigue.  All other systems reviewed and are negative.    PAST MEDICAL/SURGICAL HISTORY:  Past Medical History:  Diagnosis Date  . AICD (automatic cardioverter/defibrillator) present   . Alcoholism in remission ([place])   . Anemia   . Aortic insufficiency   . Arthritis   . At moderate risk for fall   . Bell's palsy   . Cardiomyopathy ([place])   . Chronic systolic heart failure (HCC)    NYHA Class III  . CKD (chronic kidney disease) stage 3, GFR 30-59 ml/min (HCC)   . COPD (chronic obstructive pulmonary disease) ([place])   . Essential hypertension, benign   . HIV disease ([place]) 09/06/2016  . Hyperlipidemia   . Noncompliance with medication regimen   . Nonischemic cardiomyopathy (HCC)    EF 15-20%  . NSVT (nonsustained ventricular tachycardia) ([place])   . Numbness of right jaw    Had a stoke in 01/2013. Numbness is occasional, especially when trying to chew.  . Productive cough 08/2013   With brown sputum   . SOB (shortness of breath) on exertion 08/2013  . Stroke ([place]) 01/2013   weakness of right side from CVA  . Type 2 diabetes mellitus ([place])   . Uses hearing aid 2014   recently received new hearing aids   Past Surgical History:  Procedure Laterality Date  . BIOPSY N/A 04/10/2014   Procedure: GASTRIC BIOPSY;  Surgeon: Daneil Dolin, MD;  Location: [facility] OR[facility];  Service:  Endoscopy;  Laterality: N/A;  . BIOPSY  10/12/2015   Procedure: BIOPSY;  Surgeon: Daneil Dolin, MD;  Location: [facility] ENDO SUITE;  Service: Endoscopy;;  stomach bx's  . CARDIAC DEFIBRILLATOR PLACEMENT  11/12/12   [employer] Inogen MINI ICD implanted in [place] at [place] [facility] per Dr Nelly Laurence note  . COLONOSCOPY WITH PROPOFOL N/A 04/10/2014   RMR: Colonic Diverticulosis  . ESOPHAGOGASTRODUODENOSCOPY (EGD) WITH PROPOFOL N/A 04/10/2014   RMR: Mild erosive reflux esophagitis. Multiple antral polyps likely hyperplastic status post removal by hot snare cautery technique. Diffusely abnormal stomach status post gastric biopsy I suspect some of patients anemaia may be due to intermittent oozing from the stomach. It would be difficult and a risky proposition to attempt complete removal of all of his gastric polyps.  . ESOPHAGOGASTRODUODENOSCOPY (EGD) WITH PROPOFOL N/A 10/12/2015   Procedure: ESOPHAGOGASTRODUODENOSCOPY (EGD) WITH PROPOFOL;  Surgeon: Daneil Dolin, MD;  Location: [facility] END[facility] SU[facility];  Service: Endoscopy;  Laterality: N/A;  9379 - moved to 9:15  . POLYPECTOMY N/A 04/10/2014   Procedure: GASTRIC POLYPECTOMY;  Surgeon: Daneil Dolin, MD;  Location: [facility] ORS;  Service: Endoscopy;  Laterality: N/A;  . RIGHT HEART CATHETERIZATION N/A 02/06/2014   Procedure: RIGHT HEART CATH;  Surgeon: Larey Dresser, MD;  Location: [facility] CATH LAB;  Service: Cardiovascular;  Laterality: N/A;     SOCIAL  HISTORY:  Social History   Socioeconomic History  . Marital status: Single    Spouse name: Not on file  . Number of children: Not on file  . Years of education: Not on file  . Highest education level: Not on file  Occupational History  . Occupation: disabled  Social Needs  . Financial resource strain: Not on file  . Food insecurity:    Worry: Not on file    Inability: Not on file  . Transportation needs:    Medical: Not on file    Non-medical: Not on file    Tobacco Use  . Smoking status: Former Smoker    Types: Cigarettes    Last attempt to quit: 11/05/1993    Years since quitting: 23.9  . Smokeless tobacco: Never Used  Substance and Sexual Activity  . Alcohol use: Yes    Comment: former heavy ETOH, none recently  . Drug use: No    Comment: prior history of cocaine use, smoking   . Sexual activity: Yes    Birth control/protection: None  Lifestyle  . Physical activity:    Days per week: Not on file    Minutes per session: Not on file  . Stress: Not on file  Relationships  . Social connections:    Talks on phone: Not on file    Gets together: Not on file    Attends religious service: Not on file    Active member of club or organization: Not on file    Attends meetings of clubs or organizations: Not on file    Relationship status: Not on file  . Intimate partner violence:    Fear of current or ex partner: Not on file    Emotionally abused: Not on file    Physically abused: Not on file    Forced sexual activity: Not on file  Other Topics Concern  . Not on file  Social History Narrative   Recently moved from [place] to be with family in [place] (2015).  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  Has h/o polysubstance abuse.  Baseline - able to ambulate, mild cognitive delay, able to toilet and shower himself, occasional use of walker/cane.             FAMILY HISTORY:  Family History  Problem Relation Age of Onset  . Kidney disease Mother   . Kidney disease Sister   . Colon cancer Neg Hx     CURRENT MEDICATIONS:  Outpatient Encounter Medications as of 10/25/2017  Medication Sig  . acetaminophen (TYLENOL) 325 MG tablet Take 2 tablets (650 mg total) by mouth every 4 (four) hours as needed for mild pain (or temp > 37.5 C (99.5 F)).  Marland Kitchen aspirin 325 MG tablet Take 1 tablet (325 mg total) by mouth daily.  Marland Kitchen atorvastatin (LIPITOR) 80 MG tablet Take 80 mg by mouth daily.   Marland Kitchen atovaquone (MEPRON) 750 MG/5ML  suspension Take 10 mLs (1,500 mg total) by mouth daily with breakfast.  . carvedilol (COREG) 3.125 MG tablet Take 1 tablet (3.125 mg total) by mouth 2 (two) times daily with a meal.  . Cholecalciferol (VITAMIN D3) 2000 UNITS TABS Take 2,000 mg by mouth daily.  . coal tar (NEUTROGENA T-GEL) 0.5 % shampoo Apply to scalp on shower days (Twice a week) for dandruff and dry scalp Wed., and Sat.  . digoxin (LANOXIN) 0.125 MG tablet TAKE 1/2 TABLET BY MOUTH DAILY  . dolutegravir (TIVICAY) 50 MG tablet Take  1 tablet (50 mg total) by mouth daily.  Marland Kitchen emtricitabine-tenofovir AF (DESCOVY) 200-25 MG tablet Take 1 tablet by mouth daily.  . ferrous sulfate 325 (65 FE) MG tablet Take 325 mg by mouth daily with breakfast.  . furosemide (LASIX) 20 MG tablet Take 20 mg by mouth daily.  . isosorbide dinitrate (ISORDIL) 10 MG tablet Take 10 mg by mouth 3 (three) times daily.   . Multiple Vitamin (MULTIVITAMIN WITH MINERALS) TABS tablet Take 1 tablet by mouth daily.  . naproxen (NAPROSYN) 500 MG tablet   . nystatin (MYCOSTATIN) 100000 UNIT/ML suspension   . ranitidine (ZANTAC) 150 MG tablet Take 150 mg by mouth at bedtime.  . sitaGLIPtin (JANUVIA) 50 MG tablet Take 50 mg by mouth daily.  . tamsulosin (FLOMAX) 0.4 MG CAPS capsule Take 0.4 mg by mouth daily.   No facility-administered encounter medications on file as of 10/25/2017.     ALLERGIES:  No Known Allergies   PHYSICAL EXAM:  ECOG Performance status: 2.  Vitals:   10/25/17 1410  BP: 134/81  Pulse: 74  Resp: 18  Temp: 97.6 F (36.4 C)  SpO2: 100%   Filed Weights   10/25/17 1410  Weight: 157 lb (71.2 kg)    Physical Exam Limited physical exam was done today. HEENT shows anicteric sclera. Chest is bilaterally clear to auscultation. Cardiovascular S1-S2 regular rate and rhythm. LABORATORY DATA:  I have reviewed the labs as listed.  CBC    Component Value Date/Time   WBC 4.0 10/25/2017 1309   RBC 4.50 10/25/2017 1309   HGB 13.2  10/25/2017 1309   HCT 40.9 10/25/2017 1309   PLT 153 10/25/2017 1309   MCV 90.9 10/25/2017 1309   MCH 29.3 10/25/2017 1309   MCHC 32.3 10/25/2017 1309   RDW 13.4 10/25/2017 1309   LYMPHSABS 0.5 (L) 10/25/2017 1309   MONOABS 0.8 10/25/2017 1309   EOSABS 0.4 10/25/2017 1309   BASOSABS 0.0 10/25/2017 1309   CMP Latest Ref Rng & Units 10/25/2017 09/25/2017 08/01/2017  Glucose 65 - 99 mg/dL 194(H) 130(H) 152(H)  BUN 6 - 20 mg/dL 57(H) 50(H) 51(H)  Creatinine 0.61 - 1.24 mg/dL 2.64(H) 2.33(H) 2.14(H)  Sodium 135 - 145 mmol/L 135 138 136  Potassium 3.5 - 5.1 mmol/L 4.7 4.9 4.6  Chloride 101 - 111 mmol/L 100(L) 106 103  CO2 22 - 32 mmol/L 27 23 23   Calcium 8.9 - 10.3 mg/dL 9.4 9.6 9.5  Total Protein 6.5 - 8.1 g/dL 8.1 - -  Total Bilirubin 0.3 - 1.2 mg/dL 0.7 - -  Alkaline Phos 38 - 126 U/L 92 - -  AST 15 - 41 U/L 29 - -  ALT 17 - 63 U/L 46 - -        ASSESSMENT & PLAN:   Anemia in chronic kidney disease 1.  Normocytic anemia: -Multifactorial etiology including CKD, history of blood loss and chronic inflammation. - Today hemoglobin is 13.2 with a normal platelet count.  Last Aranesp injection 50 mcg was on 12/20/2016. - Today he does not require any Aranesp.  We will see him back in 3 months for follow-up.  I plan to repeat ferritin and iron panel at that time.  2.  CKD: -His creatinine today is 2.64, slightly worse from last visit 3 months ago when it was 2.33.  3.  HIV: - He will continue follow-up with ID.  He has lymphopenia on differential consistent with HIV.      Orders placed this encounter:  Orders Placed  This Encounter  Procedures  . CBC with Differential ([facility] Only)  . CMP ([facility] only)  . Vitamin B12  . Folate  . Iron and TIBC  . Ferritin      Derek Jack, MD [place] [phone]

## 2017-10-25 NOTE — Assessment & Plan Note (Signed)
1.  Normocytic anemia: -Multifactorial etiology including CKD, history of blood loss and chronic inflammation. - Today hemoglobin is 13.2 with a normal platelet count.  Last Aranesp injection 50 mcg was on 12/20/2016. - Today he does not require any Aranesp.  We will see him back in 3 months for follow-up.  I plan to repeat ferritin and iron panel at that time.  2.  CKD: -His creatinine today is 2.64, slightly worse from last visit 3 months ago when it was 2.33.  3.  HIV: - He will continue follow-up with ID.  He has lymphopenia on differential consistent with HIV.

## 2017-10-25 NOTE — Patient Instructions (Signed)
[facility]  Discharge Instructions:  You were seen by dr. k today _______________________________________________________________  Thank you for choosing [facility] to provide your oncology and hematology care.  To afford each patient quality time with our providers, please arrive at least 15 minutes before your scheduled appointment.  You need to re-schedule your appointment if you arrive 10 or more minutes late.  We strive to give you quality time with our providers, and arriving late affects you and other patients whose appointments are after yours.  Also, if you no show three or more times for appointments you may be dismissed from the clinic.  Again, thank you for choosing  [facility]. Our hope is that these requests will allow you access to exceptional care and in a timely manner. _______________________________________________________________  If you have questions after your visit, please contact our office at [phone] between the hours of 8:30 a.m. and 5:00 p.m. Voicemails left after 4:30 p.m. will not be returned until the following business day. _______________________________________________________________  For prescription refill requests, have your pharmacy contact our office. _______________________________________________________________  Recommendations made by the consultant and any test results will be sent to your referring physician. _______________________________________________________________ 

## 2017-10-31 ENCOUNTER — Encounter (HOSPITAL_COMMUNITY)
Admission: RE | Admit: 2017-10-31 | Discharge: 2017-10-31 | Disposition: A | Discharge status: Home / Self Care | Payer: Medicare HMO | Source: Skilled Nursing Facility | Attending: Internal Medicine | Admitting: Internal Medicine

## 2017-10-31 DIAGNOSIS — D631 Anemia in chronic kidney disease: Secondary | ICD-10-CM | POA: Diagnosis not present

## 2017-10-31 DIAGNOSIS — N189 Chronic kidney disease, unspecified: Secondary | ICD-10-CM | POA: Diagnosis not present

## 2017-10-31 LAB — RENAL FUNCTION PANEL
ANION GAP: 6 (ref 5–15)
Albumin: 3.4 g/dL — ABNORMAL LOW (ref 3.5–5.0)
BUN: 55 mg/dL — ABNORMAL HIGH (ref 6–20)
CHLORIDE: 105 mmol/L (ref 101–111)
CO2: 28 mmol/L (ref 22–32)
Calcium: 9.5 mg/dL (ref 8.9–10.3)
Creatinine, Ser: 2.84 mg/dL — ABNORMAL HIGH (ref 0.61–1.24)
GFR calc Af Amer: 25 mL/min — ABNORMAL LOW (ref >60)
GFR calc non Af Amer: 22 mL/min — ABNORMAL LOW (ref >60)
GLUCOSE: 117 mg/dL — AB (ref 65–99)
POTASSIUM: 4.7 mmol/L (ref 3.5–5.1)
Phosphorus: 3.6 mg/dL (ref 2.5–4.6)
Sodium: 139 mmol/L (ref 135–145)

## 2017-10-31 LAB — IRON AND TIBC
IRON: 39 ug/dL — AB (ref 45–182)
SATURATION RATIOS: 14 % — AB (ref 17.9–39.5)
TIBC: 287 ug/dL (ref 250–450)
UIBC: 248 ug/dL

## 2017-10-31 LAB — HEMOGLOBIN AND HEMATOCRIT, BLOOD
HCT: 44.9 % (ref 39.0–52.0)
HEMOGLOBIN: 14.2 g/dL (ref 13.0–17.0)

## 2017-10-31 LAB — VITAMIN B12: VITAMIN B 12: 1077 pg/mL — AB (ref 180–914)

## 2017-10-31 LAB — FERRITIN: Ferritin: 36 ng/mL (ref 24–336)

## 2017-11-01 LAB — VITAMIN D 25 HYDROXY (VIT D DEFICIENCY, FRACTURES): VIT D 25 HYDROXY: 59.1 ng/mL (ref 30.0–100.0)

## 2017-11-01 LAB — PARATHYROID HORMONE, INTACT (NO CA): PTH: 53 pg/mL (ref 15–65)

## 2017-11-01 LAB — FOLATE: Folate: 34 ng/mL (ref >5.9)

## 2017-11-03 ENCOUNTER — Encounter (HOSPITAL_COMMUNITY)
Admission: RE | Admit: 2017-11-03 | Discharge: 2017-11-03 | Disposition: A | Discharge status: Home / Self Care | Payer: Medicare HMO | Source: Skilled Nursing Facility | Attending: Internal Medicine | Admitting: Internal Medicine

## 2017-11-03 DIAGNOSIS — R262 Difficulty in walking, not elsewhere classified: Secondary | ICD-10-CM | POA: Insufficient documentation

## 2017-11-03 DIAGNOSIS — I5022 Chronic systolic (congestive) heart failure: Secondary | ICD-10-CM | POA: Insufficient documentation

## 2017-11-03 DIAGNOSIS — I639 Cerebral infarction, unspecified: Secondary | ICD-10-CM | POA: Insufficient documentation

## 2017-11-03 DIAGNOSIS — R279 Unspecified lack of coordination: Secondary | ICD-10-CM | POA: Insufficient documentation

## 2017-11-03 DIAGNOSIS — M6281 Muscle weakness (generalized): Secondary | ICD-10-CM | POA: Insufficient documentation

## 2017-11-04 ENCOUNTER — Encounter (HOSPITAL_COMMUNITY)
Admission: RE | Admit: 2017-11-04 | Discharge: 2017-11-04 | Disposition: A | Discharge status: Home / Self Care | Payer: Medicare HMO | Source: Skilled Nursing Facility | Attending: *Deleted | Admitting: *Deleted

## 2017-11-04 DIAGNOSIS — N189 Chronic kidney disease, unspecified: Secondary | ICD-10-CM | POA: Diagnosis not present

## 2017-11-05 LAB — PROTEIN / CREATININE RATIO, URINE
Creatinine, Urine: 127.78 mg/dL
Protein Creatinine Ratio: 0.92 mg/mg{Cre} — ABNORMAL HIGH (ref 0.00–0.15)
Total Protein, Urine: 117 mg/dL

## 2018-01-02 ENCOUNTER — Encounter (HOSPITAL_COMMUNITY)
Admission: RE | Admit: 2018-01-02 | Discharge: 2018-01-02 | Disposition: A | Discharge status: Home / Self Care | Payer: Medicare HMO | Source: Skilled Nursing Facility | Attending: Internal Medicine | Admitting: Internal Medicine

## 2018-01-02 DIAGNOSIS — E785 Hyperlipidemia, unspecified: Secondary | ICD-10-CM | POA: Diagnosis not present

## 2018-01-02 DIAGNOSIS — N189 Chronic kidney disease, unspecified: Secondary | ICD-10-CM | POA: Insufficient documentation

## 2018-01-02 DIAGNOSIS — B20 Human immunodeficiency virus [HIV] disease: Secondary | ICD-10-CM | POA: Insufficient documentation

## 2018-01-02 DIAGNOSIS — I480 Paroxysmal atrial fibrillation: Secondary | ICD-10-CM | POA: Insufficient documentation

## 2018-01-02 DIAGNOSIS — I639 Cerebral infarction, unspecified: Secondary | ICD-10-CM | POA: Diagnosis present

## 2018-01-02 DIAGNOSIS — I129 Hypertensive chronic kidney disease with stage 1 through stage 4 chronic kidney disease, or unspecified chronic kidney disease: Secondary | ICD-10-CM | POA: Diagnosis not present

## 2018-01-02 LAB — HEMOGLOBIN AND HEMATOCRIT, BLOOD
HEMATOCRIT: 52.7 % — AB (ref 39.0–52.0)
Hemoglobin: 16.8 g/dL (ref 13.0–17.0)

## 2018-01-02 LAB — RENAL FUNCTION PANEL
ALBUMIN: 3.1 g/dL — AB (ref 3.5–5.0)
Anion gap: 6 (ref 5–15)
BUN: 49 mg/dL — AB (ref 8–23)
CO2: 25 mmol/L (ref 22–32)
Calcium: 9 mg/dL (ref 8.9–10.3)
Chloride: 106 mmol/L (ref 98–111)
Creatinine, Ser: 2.88 mg/dL — ABNORMAL HIGH (ref 0.61–1.24)
GFR calc Af Amer: 25 mL/min — ABNORMAL LOW (ref >60)
GFR calc non Af Amer: 21 mL/min — ABNORMAL LOW (ref >60)
GLUCOSE: 139 mg/dL — AB (ref 70–99)
PHOSPHORUS: 3.5 mg/dL (ref 2.5–4.6)
POTASSIUM: 4.4 mmol/L (ref 3.5–5.1)
SODIUM: 137 mmol/L (ref 135–145)

## 2018-01-02 LAB — IRON AND TIBC
Iron: 45 ug/dL (ref 45–182)
Saturation Ratios: 16 % — ABNORMAL LOW (ref 17.9–39.5)
TIBC: 280 ug/dL (ref 250–450)
UIBC: 235 ug/dL

## 2018-01-02 LAB — PROTEIN / CREATININE RATIO, URINE
CREATININE, URINE: 101.23 mg/dL
Protein Creatinine Ratio: 1.15 mg/mg{Cre} — ABNORMAL HIGH (ref 0.00–0.15)
Total Protein, Urine: 116 mg/dL

## 2018-01-02 LAB — FERRITIN: FERRITIN: 29 ng/mL (ref 24–336)

## 2018-01-03 LAB — VITAMIN D 25 HYDROXY (VIT D DEFICIENCY, FRACTURES): VIT D 25 HYDROXY: 45.4 ng/mL (ref 30.0–100.0)

## 2018-01-03 LAB — PARATHYROID HORMONE, INTACT (NO CA): PTH: 44 pg/mL (ref 15–65)

## 2018-01-11 ENCOUNTER — Encounter: Payer: Self-pay | Admitting: Internal Medicine

## 2018-01-11 ENCOUNTER — Non-Acute Institutional Stay (SKILLED_NURSING_FACILITY): Payer: Medicare HMO | Admitting: Internal Medicine

## 2018-01-11 DIAGNOSIS — I10 Essential (primary) hypertension: Secondary | ICD-10-CM

## 2018-01-11 DIAGNOSIS — E119 Type 2 diabetes mellitus without complications: Secondary | ICD-10-CM | POA: Diagnosis not present

## 2018-01-11 DIAGNOSIS — I5042 Chronic combined systolic (congestive) and diastolic (congestive) heart failure: Secondary | ICD-10-CM

## 2018-01-11 DIAGNOSIS — D631 Anemia in chronic kidney disease: Secondary | ICD-10-CM

## 2018-01-11 DIAGNOSIS — I2510 Atherosclerotic heart disease of native coronary artery without angina pectoris: Secondary | ICD-10-CM | POA: Diagnosis not present

## 2018-01-11 DIAGNOSIS — N189 Chronic kidney disease, unspecified: Secondary | ICD-10-CM

## 2018-01-11 DIAGNOSIS — N183 Chronic kidney disease, stage 3 unspecified: Secondary | ICD-10-CM

## 2018-01-11 NOTE — Progress Notes (Signed)
Location:    [facility] Room Number: 148/D Place of Service:  SNF [phone]) Provider: Veleta Miners MD  Sinda Du, MD  Patient Care Team: Sinda Du, MD as PCP - General (Pulmonary Disease) Gala Romney Cristopher Estimable, MD as Consulting Physician (Gastroenterology)  Extended Emergency Contact Information Primary Emergency Contact: [facility] Address: [address] [place] [address] [place] Phone: [phone] Relation: Relative Secondary Emergency Contact: [facility] Address: [address], [place] [address] [place] Phone: [phone] Relation: Sister  Code Status:  DNR Goals of care: Advanced Directive information Advanced Directives 01/11/2018  Does Patient Have a Medical Advance Directive? Yes  Type of Advance Directive Out of facility DNR (pink MOST or yellow form)  Does patient want to make changes to medical advance directive? No - Patient declined  Copy of [place] in Chart? No - copy requested  Would patient like information on creating a medical advance directive? -  Pre-existing out of facility DNR order (yellow form or pink MOST form) -     Chief Complaint  Patient presents with  . Medical Management of Chronic Issues    Patient is being seen for routine visit of medical management ,Patient due DM eye exam, and foot exam    HPI:  Pt is a 67 y.o. male seen today for medical management of chronic diseases.    He has h/o Chronic CHF with Cardiomyopathy EF of 30% in 07/18, CKD Stage 3, HIV on treatment with Infectious disease. S/P AICD, hypertension,  S/P CVA, Chronic Anemia,  Hyperlipidemia, Diabetes Mellitus, COPD  Patient is Long term Resident of the Facility. He is doing well. His left Shoulder Pain is resolved since he got Steroid Injection by Dr Aline Brochure. He walks with the Walker With Assist otherwise stays in the Wheel chair. He has  gained almost 10 lbs since my last visit and per Nurses he has good appetite. His weight is 160 lbs today. His fasting BS are less then 150. He had no new Complains. No New Nursing issues.   Past Medical History:  Diagnosis Date  . AICD (automatic cardioverter/defibrillator) present   . Alcoholism in remission ([place])   . Anemia   . Aortic insufficiency   . Arthritis   . At moderate risk for fall   . Bell's palsy   . Cardiomyopathy ([place])   . Chronic systolic heart failure (HCC)    NYHA Class III  . CKD (chronic kidney disease) stage 3, GFR 30-59 ml/min (HCC)   . COPD (chronic obstructive pulmonary disease) ([place])   . Essential hypertension, benign   . HIV disease ([place]) 09/06/2016  . Hyperlipidemia   . Noncompliance with medication regimen   . Nonischemic cardiomyopathy (HCC)    EF 15-20%  . NSVT (nonsustained ventricular tachycardia) ([place])   . Numbness of right jaw    Had a stoke in 01/2013. Numbness is occasional, especially when trying to chew.  . Productive cough 08/2013   With brown sputum   . SOB (shortness of breath) on exertion 08/2013  . Stroke ([place]) 01/2013   weakness of right side from CVA  . Type 2 diabetes mellitus ([place])   . Uses hearing aid 2014   recently received new hearing aids   Past Surgical History:  Procedure Laterality Date  . BIOPSY N/A 04/10/2014   Procedure: GASTRIC  BIOPSY;  Surgeon: Daneil Dolin, MD;  Location: [facility] ORS;  Service: Endoscopy;  Laterality: N/A;  . BIOPSY  10/12/2015   Procedure: BIOPSY;  Surgeon: Daneil Dolin, MD;  Location: [facility] ENDO SU[facility];  Service: Endoscopy;;  stomach bx's  . CARDIAC DEFIBRILLATOR PLACEMENT  11/12/12   [employer] Inogen MINI ICD implanted in [place] at [place] [facility] per Dr Nelly Laurence note  . COLONOSCOPY WITH PROPOFOL N/A 04/10/2014   RMR: Colonic Diverticulosis  . ESOPHAGOGASTRODUODENOSCOPY (EGD) WITH PROPOFOL N/A 04/10/2014   RMR: Mild erosive reflux  esophagitis. Multiple antral polyps likely hyperplastic status post removal by hot snare cautery technique. Diffusely abnormal stomach status post gastric biopsy I suspect some of patients anemaia may be due to intermittent oozing from the stomach. It would be difficult and a risky proposition to attempt complete removal of all of his gastric polyps.  . ESOPHAGOGASTRODUODENOSCOPY (EGD) WITH PROPOFOL N/A 10/12/2015   Procedure: ESOPHAGOGASTRODUODENOSCOPY (EGD) WITH PROPOFOL;  Surgeon: Daneil Dolin, MD;  Location[facility];  Service: Endoscopy;  Laterality: N/A;  1497 - moved to 9:15  . POLYPECTOMY N/A 04/10/2014   Procedure: GASTRIC POLYPECTOMY;  Surgeon: Daneil Dolin, MD;  Location: [facility] ORS;  Service: Endoscopy;  Laterality: N/A;  . RIGHT HEART CATHETERIZATION N/A 02/06/2014   Procedure: RIGHT HEART CATH;  Surgeon: Larey Dresser, MD;  Location: [facility] CATH LAB;  Service: Cardiovascular;  Laterality: N/A;    No Known Allergies  Outpatient Encounter Medications as of 01/11/2018  Medication Sig  . acetaminophen (TYLENOL) 325 MG tablet Take 2 tablets (650 mg total) by mouth every 4 (four) hours as needed for mild pain (or temp > 37.5 C (99.5 F)).  Marland Kitchen aspirin 325 MG tablet Take 1 tablet (325 mg total) by mouth daily.  Marland Kitchen atorvastatin (LIPITOR) 80 MG tablet Take 80 mg by mouth daily.   Marland Kitchen atovaquone (MEPRON) 750 MG/5ML suspension Take 10 mLs (1,500 mg total) by mouth daily with breakfast.  . carvedilol (COREG) 3.125 MG tablet Take 1 tablet (3.125 mg total) by mouth 2 (two) times daily with a meal.  . Cholecalciferol (VITAMIN D3) 2000 UNITS TABS Take 2,000 mg by mouth daily.  . coal tar (NEUTROGENA T-GEL) 0.5 % shampoo Apply to scalp on shower days (Twice a week) for dandruff and dry scalp Wed., and Sat.  . digoxin (LANOXIN) 0.125 MG tablet TAKE 1/2 TABLET BY MOUTH DAILY  . docusate sodium (COLACE) 100 MG capsule Take 100 mg by mouth daily.  . dolutegravir (TIVICAY) 50 MG tablet Take 1 tablet (50 mg  total) by mouth daily.  Marland Kitchen emtricitabine-tenofovir AF (DESCOVY) 200-25 MG tablet Take 1 tablet by mouth daily.  . ferrous sulfate 325 (65 FE) MG tablet Take 325 mg by mouth daily with breakfast.  . furosemide (LASIX) 20 MG tablet Take 20 mg by mouth daily.  . isosorbide dinitrate (ISORDIL) 10 MG tablet Take 10 mg by mouth 3 (three) times daily.   . Multiple Vitamin (MULTIVITAMIN WITH MINERALS) TABS tablet Take 1 tablet by mouth daily.  . ranitidine (ZANTAC) 150 MG tablet Take 150 mg by mouth at bedtime.  . senna-docusate (SENEXON-S) 8.6-50 MG tablet Take 1 tablet by mouth daily.  . sitaGLIPtin (JANUVIA) 50 MG tablet Take 50 mg by mouth daily.  . tamsulosin (FLOMAX) 0.4 MG CAPS capsule Take 0.4 mg by mouth daily.  . [DISCONTINUED] naproxen (NAPROSYN) 500 MG tablet   . [DISCONTINUED] nystatin (MYCOSTATIN) 100000 UNIT/ML suspension  No facility-administered encounter medications on file as of 01/11/2018.     Review of Systems  Review of Systems  Constitutional: Negative for activity change, appetite change, chills, diaphoresis, fatigue and fever.  HENT: Negative for mouth sores, postnasal drip, rhinorrhea, sinus pain and sore throat.   Respiratory: Negative for apnea, cough, chest tightness, shortness of breath and wheezing.   Cardiovascular: Negative for chest pain, palpitations and leg swelling.  Gastrointestinal: Negative for abdominal distention, abdominal pain, constipation, diarrhea, nausea and vomiting.  Genitourinary: Negative for dysuria and frequency.  Musculoskeletal: Negative for arthralgias, joint swelling and myalgias.  Skin: Negative for rash.  Neurological: Negative for dizziness, syncope, weakness, light-headedness and numbness.  Psychiatric/Behavioral: Negative for behavioral problems, confusion and sleep disturbance.     Immunization History  Administered Date(s) Administered  . Hepatitis A, Adult 09/19/2016  . Hepatitis B, adult 09/19/2016  . Influenza-Unspecified  03/13/2015, 07/07/2016, 03/03/2017  . Pneumococcal Conjugate-13 03/03/2017  . Pneumococcal Polysaccharide-23 07/08/2016, 09/19/2016  . Tdap 02/23/2017   Pertinent  Health Maintenance Due  Topic Date Due  . FOOT EXAM  02/11/2018 (Originally 02/10/1961)  . OPHTHALMOLOGY EXAM  02/11/2018 (Originally 02/10/1961)  . URINE MICROALBUMIN  02/11/2018 (Originally 02/10/1961)  . HEMOGLOBIN A1C  03/27/2018  . PNA vac Low Risk Adult (2 of 2 - PPSV23) 09/19/2021  . COLONOSCOPY  04/10/2024   Fall Risk  04/18/2017 02/23/2017 11/29/2016 11/29/2016  Falls in the past year? No No No No   Functional Status Survey:    Vitals:   01/11/18 1236  BP: 122/77  Pulse: 78  Resp: 18  Temp: 97.8 F (36.6 C)  TempSrc: Oral  SpO2: 94%  Weight: 161 lb (73 kg)  Height: 5\' 3"  (1.6 m)   Body mass index is 28.52 kg/m. Physical Exam  Constitutional: He appears well-developed and well-nourished.  HENT:  Head: Normocephalic.  Mouth/Throat: Oropharynx is clear and moist.  Eyes: Pupils are equal, round, and reactive to light.  Neck: Neck supple.  Cardiovascular: Normal rate and normal heart sounds.  No murmur heard. Pulmonary/Chest: Effort normal and breath sounds normal. No respiratory distress. He has no wheezes. He has no rales.  Abdominal: Soft. Bowel sounds are normal. He exhibits no distension. There is no tenderness. There is no rebound.  Musculoskeletal:  Trace edema Bilateral    Lymphadenopathy:    He has no cervical adenopathy.  Neurological: He is alert.  Skin: Skin is warm and dry.  Psychiatric: He has a normal mood and affect. His behavior is normal.    Labs reviewed: Recent Labs    06/26/17 0430  10/25/17 1309 10/31/17 0715 01/02/18 0300  NA 136   < > 135 139 137  K 4.6   < > 4.7 4.7 4.4  CL 101   < > 100* 105 106  CO2 25   < > 27 28 25   GLUCOSE 143*   < > 194* 117* 139*  BUN 54*   < > 57* 55* 49*  CREATININE 2.33*   < > 2.64* 2.84* 2.88*  CALCIUM 9.7   < > 9.4 9.5 9.0  PHOS 3.3  --    --  3.6 3.5   < > = values in this interval not displayed.   Recent Labs    07/25/17 0800 07/26/17 1032 10/25/17 1309 10/31/17 0715 01/02/18 0300  AST 25 28 29   --   --   ALT 31 35 46  --   --   ALKPHOS 95 102 92  --   --  BILITOT 0.5 0.5 0.7  --   --   PROT 7.3 8.0 8.1  --   --   ALBUMIN 3.2* 3.5 3.4* 3.4* 3.1*   Recent Labs    07/26/17 1032 09/25/17 0620 10/25/17 1309 10/31/17 0715 01/02/18 0441  WBC 3.1* 3.7* 4.0  --   --   NEUTROABS 1.9 2.1 2.3  --   --   HGB 12.7* 14.0 13.2 14.2 16.8  HCT 39.5 44.9 40.9 44.9 52.7*  MCV 90.4 92.6 90.9  --   --   PLT 124* 135* 153  --   --    Lab Results  Component Value Date   TSH 3.661 02/15/2017   Lab Results  Component Value Date   HGBA1C 7.3 (H) 09/25/2017   Lab Results  Component Value Date   CHOL 73 09/25/2017   HDL 26 (L) 09/25/2017   LDLCALC 27 09/25/2017   TRIG 101 09/25/2017   CHOLHDL 2.8 09/25/2017    Significant Diagnostic Results in last 30 days:  No results found.  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 Essential hypertension, BP has been stable  Diabetes mellitus type 2  Last A1C was 7.3 in 04/19 BS have been doing well on Januvia Accu check will change it to 3/week Repeat A 1C Leftshoulder Pain Resolved HIV disease  Follows with Infectious disease. Per their Notes his Viral Load is suppressed No Changes were made to his antiviral medications  History of stroke Doing well with supportive care. On aspirin BP controlled and LDL 27 in 04/19  Anemia due to renal disease and chronic infection . Follows with Hematology On Iron supplement  Chronic renal Disease  f/u with Nephrology.   Hyperlipidemia On Lipitor LDL 42 in 09/18 Urinary Retention On Flomax with no  issues   Family/ staff Communication:   Labs/tests ordered:

## 2018-01-12 ENCOUNTER — Encounter (HOSPITAL_COMMUNITY)
Admission: RE | Admit: 2018-01-12 | Discharge: 2018-01-12 | Disposition: A | Discharge status: Home / Self Care | Payer: Medicare HMO | Source: Skilled Nursing Facility | Attending: Internal Medicine | Admitting: Internal Medicine

## 2018-01-12 DIAGNOSIS — E1129 Type 2 diabetes mellitus with other diabetic kidney complication: Secondary | ICD-10-CM | POA: Diagnosis present

## 2018-01-12 DIAGNOSIS — I130 Hypertensive heart and chronic kidney disease with heart failure and stage 1 through stage 4 chronic kidney disease, or unspecified chronic kidney disease: Secondary | ICD-10-CM | POA: Diagnosis present

## 2018-01-12 DIAGNOSIS — I639 Cerebral infarction, unspecified: Secondary | ICD-10-CM | POA: Insufficient documentation

## 2018-01-12 DIAGNOSIS — I5022 Chronic systolic (congestive) heart failure: Secondary | ICD-10-CM | POA: Insufficient documentation

## 2018-01-12 LAB — HEMOGLOBIN A1C
Hgb A1c MFr Bld: 7.2 % — ABNORMAL HIGH (ref 4.8–5.6)
Mean Plasma Glucose: 159.94 mg/dL

## 2018-01-18 ENCOUNTER — Other Ambulatory Visit (HOSPITAL_COMMUNITY): Payer: Self-pay

## 2018-01-18 DIAGNOSIS — D61818 Other pancytopenia: Secondary | ICD-10-CM

## 2018-01-19 ENCOUNTER — Inpatient Hospital Stay (HOSPITAL_COMMUNITY): Payer: Medicare HMO | Attending: Hematology

## 2018-01-19 DIAGNOSIS — D61818 Other pancytopenia: Secondary | ICD-10-CM

## 2018-01-19 DIAGNOSIS — E785 Hyperlipidemia, unspecified: Secondary | ICD-10-CM | POA: Diagnosis not present

## 2018-01-19 DIAGNOSIS — I429 Cardiomyopathy, unspecified: Secondary | ICD-10-CM | POA: Diagnosis not present

## 2018-01-19 DIAGNOSIS — Z95810 Presence of automatic (implantable) cardiac defibrillator: Secondary | ICD-10-CM | POA: Insufficient documentation

## 2018-01-19 DIAGNOSIS — N183 Chronic kidney disease, stage 3 (moderate): Secondary | ICD-10-CM | POA: Diagnosis not present

## 2018-01-19 DIAGNOSIS — M1990 Unspecified osteoarthritis, unspecified site: Secondary | ICD-10-CM | POA: Insufficient documentation

## 2018-01-19 DIAGNOSIS — I130 Hypertensive heart and chronic kidney disease with heart failure and stage 1 through stage 4 chronic kidney disease, or unspecified chronic kidney disease: Secondary | ICD-10-CM | POA: Insufficient documentation

## 2018-01-19 DIAGNOSIS — Z8673 Personal history of transient ischemic attack (TIA), and cerebral infarction without residual deficits: Secondary | ICD-10-CM | POA: Insufficient documentation

## 2018-01-19 DIAGNOSIS — B20 Human immunodeficiency virus [HIV] disease: Secondary | ICD-10-CM | POA: Insufficient documentation

## 2018-01-19 DIAGNOSIS — Z800 Family history of malignant neoplasm of digestive organs: Secondary | ICD-10-CM | POA: Insufficient documentation

## 2018-01-19 DIAGNOSIS — N189 Chronic kidney disease, unspecified: Secondary | ICD-10-CM

## 2018-01-19 DIAGNOSIS — E1122 Type 2 diabetes mellitus with diabetic chronic kidney disease: Secondary | ICD-10-CM | POA: Insufficient documentation

## 2018-01-19 DIAGNOSIS — D631 Anemia in chronic kidney disease: Secondary | ICD-10-CM

## 2018-01-19 DIAGNOSIS — F1021 Alcohol dependence, in remission: Secondary | ICD-10-CM | POA: Diagnosis not present

## 2018-01-19 DIAGNOSIS — Z79899 Other long term (current) drug therapy: Secondary | ICD-10-CM | POA: Insufficient documentation

## 2018-01-19 DIAGNOSIS — I2510 Atherosclerotic heart disease of native coronary artery without angina pectoris: Secondary | ICD-10-CM | POA: Insufficient documentation

## 2018-01-19 DIAGNOSIS — I5042 Chronic combined systolic (congestive) and diastolic (congestive) heart failure: Secondary | ICD-10-CM | POA: Insufficient documentation

## 2018-01-19 DIAGNOSIS — J449 Chronic obstructive pulmonary disease, unspecified: Secondary | ICD-10-CM | POA: Diagnosis not present

## 2018-01-19 DIAGNOSIS — Z87891 Personal history of nicotine dependence: Secondary | ICD-10-CM | POA: Insufficient documentation

## 2018-01-19 LAB — IRON AND TIBC
Iron: 55 ug/dL (ref 45–182)
SATURATION RATIOS: 18 % (ref 17.9–39.5)
TIBC: 300 ug/dL (ref 250–450)
UIBC: 245 ug/dL

## 2018-01-19 LAB — COMPREHENSIVE METABOLIC PANEL
ALK PHOS: 93 U/L (ref 38–126)
ALT: 29 U/L (ref 0–44)
ANION GAP: 7 (ref 5–15)
AST: 25 U/L (ref 15–41)
Albumin: 3.4 g/dL — ABNORMAL LOW (ref 3.5–5.0)
BUN: 42 mg/dL — ABNORMAL HIGH (ref 8–23)
CALCIUM: 9.1 mg/dL (ref 8.9–10.3)
CO2: 23 mmol/L (ref 22–32)
Chloride: 105 mmol/L (ref 98–111)
Creatinine, Ser: 2.68 mg/dL — ABNORMAL HIGH (ref 0.61–1.24)
GFR calc Af Amer: 27 mL/min — ABNORMAL LOW (ref >60)
GFR, EST NON AFRICAN AMERICAN: 23 mL/min — AB (ref >60)
Glucose, Bld: 153 mg/dL — ABNORMAL HIGH (ref 70–99)
Potassium: 4.9 mmol/L (ref 3.5–5.1)
Sodium: 135 mmol/L (ref 135–145)
TOTAL PROTEIN: 7.5 g/dL (ref 6.5–8.1)
Total Bilirubin: 0.8 mg/dL (ref 0.3–1.2)

## 2018-01-19 LAB — CBC WITH DIFFERENTIAL/PLATELET
Basophils Absolute: 0 10*3/uL (ref 0.0–0.1)
Basophils Relative: 0 %
Eosinophils Absolute: 0.3 10*3/uL (ref 0.0–0.7)
Eosinophils Relative: 7 %
HCT: 43.3 % (ref 39.0–52.0)
HEMOGLOBIN: 14.1 g/dL (ref 13.0–17.0)
LYMPHS ABS: 0.5 10*3/uL — AB (ref 0.7–4.0)
Lymphocytes Relative: 16 %
MCH: 29.6 pg (ref 26.0–34.0)
MCHC: 32.6 g/dL (ref 30.0–36.0)
MCV: 91 fL (ref 78.0–100.0)
MONO ABS: 0.5 10*3/uL (ref 0.1–1.0)
MONOS PCT: 15 %
NEUTROS ABS: 2.1 10*3/uL (ref 1.7–7.7)
NEUTROS PCT: 62 %
Platelets: 109 10*3/uL — ABNORMAL LOW (ref 150–400)
RBC: 4.76 MIL/uL (ref 4.22–5.81)
RDW: 13.8 % (ref 11.5–15.5)
WBC: 3.4 10*3/uL — ABNORMAL LOW (ref 4.0–10.5)

## 2018-01-19 LAB — VITAMIN B12: VITAMIN B 12: 1031 pg/mL — AB (ref 180–914)

## 2018-01-19 LAB — FOLATE: FOLATE: 36.5 ng/mL (ref >5.9)

## 2018-01-19 LAB — FERRITIN: Ferritin: 29 ng/mL (ref 24–336)

## 2018-01-25 ENCOUNTER — Ambulatory Visit (INDEPENDENT_AMBULATORY_CARE_PROVIDER_SITE_OTHER): Payer: Medicare HMO | Admitting: *Deleted

## 2018-01-25 DIAGNOSIS — I429 Cardiomyopathy, unspecified: Secondary | ICD-10-CM

## 2018-01-26 ENCOUNTER — Inpatient Hospital Stay (HOSPITAL_BASED_OUTPATIENT_CLINIC_OR_DEPARTMENT_OTHER): Payer: Medicare HMO | Admitting: Internal Medicine

## 2018-01-26 ENCOUNTER — Encounter (HOSPITAL_COMMUNITY): Payer: Self-pay | Admitting: Internal Medicine

## 2018-01-26 VITALS — BP 124/86 | HR 79 | Temp 97.8°F | Resp 14 | Wt 150.3 lb

## 2018-01-26 DIAGNOSIS — E1122 Type 2 diabetes mellitus with diabetic chronic kidney disease: Secondary | ICD-10-CM

## 2018-01-26 DIAGNOSIS — E785 Hyperlipidemia, unspecified: Secondary | ICD-10-CM

## 2018-01-26 DIAGNOSIS — Z79899 Other long term (current) drug therapy: Secondary | ICD-10-CM

## 2018-01-26 DIAGNOSIS — N183 Chronic kidney disease, stage 3 unspecified: Secondary | ICD-10-CM

## 2018-01-26 DIAGNOSIS — I2510 Atherosclerotic heart disease of native coronary artery without angina pectoris: Secondary | ICD-10-CM

## 2018-01-26 DIAGNOSIS — I429 Cardiomyopathy, unspecified: Secondary | ICD-10-CM

## 2018-01-26 DIAGNOSIS — Z8673 Personal history of transient ischemic attack (TIA), and cerebral infarction without residual deficits: Secondary | ICD-10-CM

## 2018-01-26 DIAGNOSIS — J449 Chronic obstructive pulmonary disease, unspecified: Secondary | ICD-10-CM

## 2018-01-26 DIAGNOSIS — F1021 Alcohol dependence, in remission: Secondary | ICD-10-CM

## 2018-01-26 DIAGNOSIS — Z95810 Presence of automatic (implantable) cardiac defibrillator: Secondary | ICD-10-CM

## 2018-01-26 DIAGNOSIS — I130 Hypertensive heart and chronic kidney disease with heart failure and stage 1 through stage 4 chronic kidney disease, or unspecified chronic kidney disease: Secondary | ICD-10-CM

## 2018-01-26 DIAGNOSIS — I5042 Chronic combined systolic (congestive) and diastolic (congestive) heart failure: Secondary | ICD-10-CM

## 2018-01-26 DIAGNOSIS — B20 Human immunodeficiency virus [HIV] disease: Secondary | ICD-10-CM

## 2018-01-26 DIAGNOSIS — D631 Anemia in chronic kidney disease: Secondary | ICD-10-CM

## 2018-01-26 DIAGNOSIS — Z800 Family history of malignant neoplasm of digestive organs: Secondary | ICD-10-CM

## 2018-01-26 DIAGNOSIS — M1990 Unspecified osteoarthritis, unspecified site: Secondary | ICD-10-CM

## 2018-01-26 NOTE — Progress Notes (Signed)
Diagnosis Anemia in stage 3 chronic kidney disease ([place]) - Plan: CBC with Differential/Platelet, Comprehensive metabolic panel, Lactate dehydrogenase, Protein electrophoresis, serum, Ferritin  Staging Cancer Staging No matching staging information was found for the patient.  Assessment and Plan:  1.  Anemia in chronic kidney disease.  Labs done 01/19/2018 reviewed and showed WBC 3.4, HB 14.1 plts 109,000.  Chemistries K+ 4.9 and normal LFTs.  Ferritin is 29.  Last Aranesp injection 50 mcg was on 12/20/2016.  Pt will RTC in 06/2018 for follow-up and repeat labs.    2  Leukopenia.  WBC is 3.4.  Likely normal variant or due to HIV.  Pt referred for ID follow-up.    3.  CKD.  Cr 2.68.  He is referred for nephrology follow-up.   4.  HIV.  Pt reportedly has not been following with ID.  He is referred to ID.    5.  HTN.  BP is 124/86.  Follow-up with PCP.    6.  DM.  Follow-up with PCP.    Current Status:  Pt is seen today for follow-up to go over labs.     Problem List Patient Active Problem List   Diagnosis Date Noted  . Screening examination for venereal disease [Z11.3] 10/16/2017  . Metabolic encephalopathy [M38.46] 12/13/2016  . Chronic combined systolic and diastolic CHF (congestive heart failure) ([place]) [I50.42] 12/09/2016  . COPD (chronic obstructive pulmonary disease) ([place]) [J44.9] 09/12/2016  . HIV disease ([place]) [B20] 09/06/2016  . DNR (do not resuscitate) discussion [Z71.89]   . CVA (cerebral vascular accident) ([place]) [I63.9] 06/26/2016  . Pancytopenia ([place]) [K59.935] 09/30/2015  . 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 Gastric polyp [K31.7]   . Reflux esophagitis [K21.0]   . Elevated LFTs [R94.5] 03/26/2014  . Anemia in chronic kidney disease [N18.9, D63.1] 03/26/2014  . Carotid bruit [R09.89] 12/18/2013  . Chronic kidney disease (CKD), active medical management without dialysis, stage 3 (moderate) ([place]) [N18.3] 10/23/2013  . Diabetes  mellitus type 2 in nonobese ([place]) [E11.9] 10/23/2013  . Essential hypertension, benign [I10] 10/23/2013  . CAD (coronary artery disease) [I25.10] 10/23/2013  . Other and unspecified hyperlipidemia [E78.5] 10/23/2013    Past Medical History Past Medical History:  Diagnosis Date  . AICD (automatic cardioverter/defibrillator) present   . Alcoholism in remission ([place])   . Anemia   . Aortic insufficiency   . Arthritis   . At moderate risk for fall   . Bell's palsy   . Cardiomyopathy ([place])   . Chronic systolic heart failure (HCC)    NYHA Class III  . CKD (chronic kidney disease) stage 3, GFR 30-59 ml/min (HCC)   . COPD (chronic obstructive pulmonary disease) ([place])   . Essential hypertension, benign   . HIV disease ([place]) 09/06/2016  . Hyperlipidemia   . Noncompliance with medication regimen   . Nonischemic cardiomyopathy (HCC)    EF 15-20%  . NSVT (nonsustained ventricular tachycardia) ([place])   . Numbness of right jaw    Had a stoke in 01/2013. Numbness is occasional, especially when trying to chew.  . Productive cough 08/2013   With brown sputum   . SOB (shortness of breath) on exertion 08/2013  . Stroke ([place]) 01/2013   weakness of right side from CVA  . Type 2 diabetes mellitus ([place])   . Uses hearing aid 2014   recently received new hearing aids    Past Surgical History Past Surgical History:  Procedure Laterality Date  . BIOPSY N/A 04/10/2014  Procedure: GASTRIC BIOPSY;  Surgeon: Daneil Dolin, MD;  Location: [facility];  Service: Endoscopy;  Laterality: N/A;  . BIOPSY  10/12/2015   Procedure: BIOPSY;  Surgeon: Daneil Dolin, MD;  Location: [facility];  Service: Endoscopy;;  stomach bx's  . CARDIAC DEFIBRILLATOR PLACEMENT  11/12/12   [employer] Inogen MINI ICD implanted in [place] at [place] [facility] per Dr Nelly Laurence note  . COLONOSCOPY WITH PROPOFOL N/A 04/10/2014   RMR: Colonic Diverticulosis  .  ESOPHAGOGASTRODUODENOSCOPY (EGD) WITH PROPOFOL N/A 04/10/2014   RMR: Mild erosive reflux esophagitis. Multiple antral polyps likely hyperplastic status post removal by hot snare cautery technique. Diffusely abnormal stomach status post gastric biopsy I suspect some of patients anemaia may be due to intermittent oozing from the stomach. It would be difficult and a risky proposition to attempt complete removal of all of his gastric polyps.  . ESOPHAGOGASTRODUODENOSCOPY (EGD) WITH PROPOFOL N/A 10/12/2015   Procedure: ESOPHAGOGASTRODUODENOSCOPY (EGD) WITH PROPOFOL;  Surgeon: Daneil Dolin, MD;  Location: [facility];  Service: Endoscopy;  Laterality: N/A;  7408 - moved to 9:15  . POLYPECTOMY N/A 04/10/2014   Procedure: GASTRIC POLYPECTOMY;  Surgeon: Daneil Dolin, MD;  Location: [facility];  Service: Endoscopy;  Laterality: N/A;  . RIGHT HEART CATHETERIZATION N/A 02/06/2014   Procedure: RIGHT HEART CATH;  Surgeon: Larey Dresser, MD;  Location: [facility] CATH LAB;  Service: Cardiovascular;  Laterality: N/A;    Family History Family History  Problem Relation Age of Onset  . Kidney disease Mother   . Kidney disease Sister   . Colon cancer Neg Hx      Social History  reports that he quit smoking about 24 years ago. His smoking use included cigarettes. He has never used smokeless tobacco. He reports that he drinks alcohol. He reports that he does not use drugs.  Medications No current outpatient medications on file.  Allergies Patient has no known allergies.  Review of Systems Review of Systems - Oncology ROS negative   Physical Exam  Vitals Wt Readings from Last 3 Encounters:  01/26/18 150 lb 4.8 oz (68.2 kg)  01/11/18 161 lb (73 kg)  10/25/17 157 lb (71.2 kg)   Temp Readings from Last 3 Encounters:  01/26/18 97.8 F (36.6 C) (Oral)  01/11/18 97.8 F (36.6 C) (Oral)  10/25/17 97.6 F (36.4 C) (Oral)   BP Readings from Last 3 Encounters:  01/26/18 124/86  01/11/18 122/77  10/25/17  134/81   Pulse Readings from Last 3 Encounters:  01/26/18 79  01/11/18 78  10/25/17 74   Constitutional: Chronically ill appearing in NAD.   HENT: Head: Normocephalic and atraumatic.  Mouth/Throat: No oropharyngeal exudate. Mucosa moist. Eyes: Pupils are equal, round, and reactive to light. Conjunctivae are normal. No scleral icterus.  Neck: Normal range of motion. Neck supple. No JVD present.  Cardiovascular: Normal rate, regular rhythm and normal heart sounds.  Exam reveals no gallop and no friction rub.   No murmur heard. Pulmonary/Chest: Effort normal and breath sounds normal. No respiratory distress. No wheezes.No rales.  Abdominal: Soft. Bowel sounds are normal. No distension. There is no tenderness. There is no guarding.  Musculoskeletal: No edema or tenderness.  Lymphadenopathy: No cervical, axillary or supraclavicular adenopathy.  Neurological: Alert and oriented to person, place, and time. No cranial nerve deficit.  Skin: Skin is warm and dry. No rash noted. No erythema. No pallor.  Psychiatric: Affect and judgment normal.  Labs No visits with results within 3 Day(s) from this visit.  Latest known visit with results is:  Appointment on 01/19/2018  Component Date Value Ref Range Status  . Vitamin B-12 01/19/2018 1,031* 180 - 914 pg/mL Final   Comment: (NOTE) This assay is not validated for testing neonatal or myeloproliferative syndrome specimens for Vitamin B12 levels. Performed at [facility], [place] [address], [place], [place] [address]   . Folate 01/19/2018 36.5  >5.9 ng/mL Final   Performed at [facility], [place] [address], [place], [place] [address]  . Iron 01/19/2018 55  45 - 182 ug/dL Final  . TIBC 01/19/2018 300  250 - 450 ug/dL Final  . Saturation Ratios 01/19/2018 18  17.9 - 39.5 % Final  . UIBC 01/19/2018 245  ug/dL Final   Performed at [facility], [place] [address], [place], [place] [address]  . Ferritin 01/19/2018 29  24 - 336 ng/mL  Final   Performed at [place] [address], [place], [place] [address]  . WBC 01/19/2018 3.4* 4.0 - 10.5 K/uL Final  . RBC 01/19/2018 4.76  4.22 - 5.81 MIL/uL Final  . Hemoglobin 01/19/2018 14.1  13.0 - 17.0 g/dL Final  . HCT 01/19/2018 43.3  39.0 - 52.0 % Final  . MCV 01/19/2018 91.0  78.0 - 100.0 fL Final  . MCH 01/19/2018 29.6  26.0 - 34.0 pg Final  . MCHC 01/19/2018 32.6  30.0 - 36.0 g/dL Final  . RDW 01/19/2018 13.8  11.5 - 15.5 % Final  . Platelets 01/19/2018 109* 150 - 400 K/uL Final   Comment: PLATELET COUNT CONFIRMED BY SMEAR SPECIMEN CHECKED FOR CLOTS   . Neutrophils Relative % 01/19/2018 62  % Final  . Neutro Abs 01/19/2018 2.1  1.7 - 7.7 K/uL Final  . Lymphocytes Relative 01/19/2018 16  % Final  . Lymphs Abs 01/19/2018 0.5* 0.7 - 4.0 K/uL Final  . Monocytes Relative 01/19/2018 15  % Final  . Monocytes Absolute 01/19/2018 0.5  0.1 - 1.0 K/uL Final  . Eosinophils Relative 01/19/2018 7  % Final  . Eosinophils Absolute 01/19/2018 0.3  0.0 - 0.7 K/uL Final  . Basophils Relative 01/19/2018 0  % Final  . Basophils Absolute 01/19/2018 0.0  0.0 - 0.1 K/uL Final   Performed at [facility], [address] [place], [place] [address]  . Sodium 01/19/2018 135  135 - 145 mmol/L Final  . Potassium 01/19/2018 4.9  3.5 - 5.1 mmol/L Final  . Chloride 01/19/2018 105  98 - 111 mmol/L Final  . CO2 01/19/2018 23  22 - 32 mmol/L Final  . Glucose, Bld 01/19/2018 153* 70 - 99 mg/dL Final  . BUN 01/19/2018 42* 8 - 23 mg/dL Final  . Creatinine, Ser 01/19/2018 2.68* 0.61 - 1.24 mg/dL Final  . Calcium 01/19/2018 9.1  8.9 - 10.3 mg/dL Final  . Total Protein 01/19/2018 7.5  6.5 - 8.1 g/dL Final  . Albumin 01/19/2018 3.4* 3.5 - 5.0 g/dL Final  . AST 01/19/2018 25  15 - 41 U/L Final  . ALT 01/19/2018 29  0 - 44 U/L Final  . Alkaline Phosphatase 01/19/2018 93  38 - 126 U/L Final  . Total Bilirubin 01/19/2018 0.8  0.3 - 1.2 mg/dL Final  . GFR calc non Af Amer 01/19/2018 23* >60 mL/min  Final  . GFR calc Af Amer 01/19/2018 27* >60 mL/min Final   Comment: (NOTE) The eGFR has been calculated using the  CKD EPI equation. This calculation has not been validated in all clinical situations. eGFR's persistently <60 mL/min signify possible Chronic Kidney Disease.   Georgiann Hahn gap 01/19/2018 7  5 - 15 Final   Performed at [facility], [address], [place], [place] [address]     Pathology Orders Placed This Encounter  Procedures  . CBC with Differential/Platelet    Standing Status:   Future    Standing Expiration Date:   01/27/2020  . Comprehensive metabolic panel    Standing Status:   Future    Standing Expiration Date:   01/27/2020  . Lactate dehydrogenase    Standing Status:   Future    Standing Expiration Date:   01/27/2020  . Protein electrophoresis, serum    Standing Status:   Future    Standing Expiration Date:   01/27/2020  . Ferritin    Standing Status:   Future    Standing Expiration Date:   01/27/2020       Zoila Shutter MD

## 2018-01-26 NOTE — Patient Instructions (Signed)
[facility] at [facility] Discharge Instructions  You saw Dr. Higgs today.   Thank you for choosing [facility] at [facility] to provide your oncology and hematology care.  To afford each patient quality time with our provider, please arrive at least 15 minutes before your scheduled appointment time.   If you have a lab appointment with the Cancer Center please come in thru the  Main Entrance and check in at the main information desk  You need to re-schedule your appointment should you arrive 10 or more minutes late.  We strive to give you quality time with our providers, and arriving late affects you and other patients whose appointments are after yours.  Also, if you no show three or more times for appointments you may be dismissed from the clinic at the providers discretion.     Again, thank you for choosing [facility].  Our hope is that these requests will decrease the amount of time that you wait before being seen by our physicians.       _____________________________________________________________  Should you have questions after your visit to [facility], please contact our office at [phone] between the hours of 8:00 a.m. and 4:30 p.m.  Voicemails left after 4:00 p.m. will not be returned until the following business day.  For prescription refill requests, have your pharmacy contact our office and allow 72 hours.    Cancer Center Support Programs:   > Cancer Support Group  2nd Tuesday of the month 1pm-2pm, Journey Room    

## 2018-01-26 NOTE — Progress Notes (Signed)
Remote ICD transmission.   

## 2018-02-21 LAB — CUP PACEART REMOTE DEVICE CHECK
Date Time Interrogation Session: 20190925155320
Implantable Lead Model: 180
Implantable Pulse Generator Implant Date: 20140617
MDC IDC LEAD IMPLANT DT: 20140617
MDC IDC LEAD LOCATION: 753862
MDC IDC LEAD SERIAL: 310936
Pulse Gen Serial Number: 436636

## 2018-03-05 ENCOUNTER — Non-Acute Institutional Stay (SKILLED_NURSING_FACILITY): Payer: Medicare HMO

## 2018-03-05 DIAGNOSIS — Z0000 Encounter for general adult medical examination without abnormal findings: Secondary | ICD-10-CM

## 2018-03-05 NOTE — Patient Instructions (Addendum)
Keith Young , Thank you for taking time to come for your Medicare Wellness Visit. I appreciate your ongoing commitment to your health goals. Please review the following plan we discussed and let me know if I can assist you in the future.   Screening recommendations/referrals: Colonoscopy excluded, over age 66 Recommended yearly ophthalmology/optometry visit for glaucoma screening and checkup Recommended yearly dental visit for hygiene and checkup  Vaccinations: Influenza vaccine excluded Pneumococcal vaccine up to date, compelted Tdap vaccine up to date, due 02/24/2027 Shingles vaccine not in past records    Advanced directives: in chart  Conditions/risks identified: none  Next appointment: Dr. Lyndel Safe makes rounds  Preventive Care 66 Years and Older, Male Preventive care refers to lifestyle choices and visits with your health care provider that can promote health and wellness. What does preventive care include?  A yearly physical exam. This is also called an annual well check.  Dental exams once or twice a year.  Routine eye exams. Ask your health care provider how often you should have your eyes checked.  Personal lifestyle choices, including:  Daily care of your teeth and gums.  Regular physical activity.  Eating a healthy diet.  Avoiding tobacco and drug use.  Limiting alcohol use.  Practicing safe sex.  Taking low doses of aspirin every day.  Taking vitamin and mineral supplements as recommended by your health care provider. What happens during an annual well check? The services and screenings done by your health care provider during your annual well check will depend on your age, overall health, lifestyle risk factors, and family history of disease. Counseling  Your health care provider may ask you questions about your:  Alcohol use.  Tobacco use.  Drug use.  Emotional well-being.  Home and relationship well-being.  Sexual activity.  Eating  habits.  History of falls.  Memory and ability to understand (cognition).  Work and work Statistician. Screening  You may have the following tests or measurements:  Height, weight, and BMI.  Blood pressure.  Lipid and cholesterol levels. These may be checked every 5 years, or more frequently if you are over 66 years old.  Skin check.  Lung cancer screening. You may have this screening every year starting at age 66 if you have a 30-pack-year history of smoking and currently smoke or have quit within the past 15 years.  Fecal occult blood test (FOBT) of the stool. You may have this test every year starting at age 66.  Flexible sigmoidoscopy or colonoscopy. You may have a sigmoidoscopy every 5 years or a colonoscopy every 10 years starting at age 66.  Prostate cancer screening. Recommendations will vary depending on your family history and other risks.  Hepatitis C blood test.  Hepatitis B blood test.  Sexually transmitted disease (STD) testing.  Diabetes screening. This is done by checking your blood sugar (glucose) after you have not eaten for a while (fasting). You may have this done every 1-3 years.  Abdominal aortic aneurysm (AAA) screening. You may need this if you are a current or former smoker.  Osteoporosis. You may be screened starting at age 66 if you are at high risk. Talk with your health care provider about your test results, treatment options, and if necessary, the need for more tests. Vaccines  Your health care provider may recommend certain vaccines, such as:  Influenza vaccine. This is recommended every year.  Tetanus, diphtheria, and acellular pertussis (Tdap, Td) vaccine. You may need a Td booster every 10 years.  Zoster vaccine. You may need this after age 66.  Pneumococcal 13-valent conjugate (PCV13) vaccine. One dose is recommended after age 66.  Pneumococcal polysaccharide (PPSV23) vaccine. One dose is recommended after age 66. Talk to your health  care provider about which screenings and vaccines you need and how often you need them. This information is not intended to replace advice given to you by your health care provider. Make sure you discuss any questions you have with your health care provider. Document Released: 06/12/2015 Document Revised: 02/03/2016 Document Reviewed: 03/17/2015 Elsevier Interactive Patient Education  2017 [place] Prevention in the Home Falls can cause injuries. They can happen to people of all ages. There are many things you can do to make your home safe and to help prevent falls. What can I do on the outside of my home?  Regularly fix the edges of walkways and driveways and fix any cracks.  Remove anything that might make you trip as you walk through a door, such as a raised step or threshold.  Trim any bushes or trees on the path to your home.  Use bright outdoor lighting.  Clear any walking paths of anything that might make someone trip, such as rocks or tools.  Regularly check to see if handrails are loose or broken. Make sure that both sides of any steps have handrails.  Any raised decks and porches should have guardrails on the edges.  Have any leaves, snow, or ice cleared regularly.  Use sand or salt on walking paths during winter.  Clean up any spills in your garage right away. This includes oil or grease spills. What can I do in the bathroom?  Use night lights.  Install grab bars by the toilet and in the tub and shower. Do not use towel bars as grab bars.  Use non-skid mats or decals in the tub or shower.  If you need to sit down in the shower, use a plastic, non-slip stool.  Keep the floor dry. Clean up any water that spills on the floor as soon as it happens.  Remove soap buildup in the tub or shower regularly.  Attach bath mats securely with double-sided non-slip rug tape.  Do not have throw rugs and other things on the floor that can make you trip. What can I do  in the bedroom?  Use night lights.  Make sure that you have a light by your bed that is easy to reach.  Do not use any sheets or blankets that are too big for your bed. They should not hang down onto the floor.  Have a firm chair that has side arms. You can use this for support while you get dressed.  Do not have throw rugs and other things on the floor that can make you trip. What can I do in the kitchen?  Clean up any spills right away.  Avoid walking on wet floors.  Keep items that you use a lot in easy-to-reach places.  If you need to reach something above you, use a strong step stool that has a grab bar.  Keep electrical cords out of the way.  Do not use floor polish or wax that makes floors slippery. If you must use wax, use non-skid floor wax.  Do not have throw rugs and other things on the floor that can make you trip. What can I do with my stairs?  Do not leave any items on the stairs.  Make sure that there are  handrails on both sides of the stairs and use them. Fix handrails that are broken or loose. Make sure that handrails are as long as the stairways.  Check any carpeting to make sure that it is firmly attached to the stairs. Fix any carpet that is loose or worn.  Avoid having throw rugs at the top or bottom of the stairs. If you do have throw rugs, attach them to the floor with carpet tape.  Make sure that you have a light switch at the top of the stairs and the bottom of the stairs. If you do not have them, ask someone to add them for you. What else can I do to help prevent falls?  Wear shoes that:  Do not have high heels.  Have rubber bottoms.  Are comfortable and fit you well.  Are closed at the toe. Do not wear sandals.  If you use a stepladder:  Make sure that it is fully opened. Do not climb a closed stepladder.  Make sure that both sides of the stepladder are locked into place.  Ask someone to hold it for you, if possible.  Clearly mark  and make sure that you can see:  Any grab bars or handrails.  First and last steps.  Where the edge of each step is.  Use tools that help you move around (mobility aids) if they are needed. These include:  Canes.  Walkers.  Scooters.  Crutches.  Turn on the lights when you go into a dark area. Replace any light bulbs as soon as they burn out.  Set up your furniture so you have a clear path. Avoid moving your furniture around.  If any of your floors are uneven, fix them.  If there are any pets around you, be aware of where they are.  Review your medicines with your doctor. Some medicines can make you feel dizzy. This can increase your chance of falling. Ask your doctor what other things that you can do to help prevent falls. This information is not intended to replace advice given to you by your health care provider. Make sure you discuss any questions you have with your health care provider. Document Released: 03/12/2009 Document Revised: 10/22/2015 Document Reviewed: 06/20/2014 Elsevier Interactive Patient Education  2017 [employer].

## 2018-03-05 NOTE — Progress Notes (Signed)
Subjective:   Keith Young is a 67 y.o. male who presents for Medicare Annual/Subsequent preventive examination at [facility]  Last AWV-02/23/2017    Objective:    Vitals: BP 122/76 (BP Location: Left Arm, Patient Position: Sitting)   Pulse 74   Temp 97.8 F (36.6 C) (Oral)   Ht 5\' 3"  (1.6 m)   Wt 150 lb (68 kg)   BMI 26.57 kg/m   Body mass index is 26.57 kg/m.  Advanced Directives 03/05/2018 01/26/2018 01/11/2018 10/25/2017 09/22/2017 08/24/2017 08/10/2017  Does Patient Have a Medical Advance Directive? Yes Yes Yes Yes Yes Yes Yes  Type of Advance Directive Out of facility DNR (pink MOST or yellow form) Out of facility DNR (pink MOST or yellow form) Out of facility DNR (pink MOST or yellow form) Out of facility DNR (pink MOST or yellow form) Out of facility DNR (pink MOST or yellow form) Out of facility DNR (pink MOST or yellow form) Out of facility DNR (pink MOST or yellow form)  Does patient want to make changes to medical advance directive? No - Patient declined No - Patient declined No - Patient declined No - Patient declined No - Patient declined No - Patient declined No - Patient declined  Copy of [place] in Chart? No - copy requested - No - copy requested No - copy requested No - copy requested No - copy requested No - copy requested  Would patient like information on creating a medical advance directive? - - - - - - -  Pre-existing out of facility DNR order (yellow form or pink MOST form) Yellow form placed in chart (order not valid for inpatient use) - - - - - -    Tobacco Social History   Tobacco Use  Smoking Status Former Smoker  . Types: Cigarettes  . Last attempt to quit: 11/05/1993  . Years since quitting: 24.3  Smokeless Tobacco Never Used     Counseling given: Not Answered   Clinical Intake:  Pre-visit preparation completed: No  Pain : No/denies pain     Diabetes: Yes CBG done?: No Did pt. bring in CBG monitor  from home?: No  How often do you need to have someone help you when you read instructions, pamphlets, or other written materials from your doctor or pharmacy?: 2 - Rarely What is the last grade level you completed in school?: 11th grade  Interpreter Needed?: No  Information entered by :: Tyson Dense, RN  Past Medical History:  Diagnosis Date  . AICD (automatic cardioverter/defibrillator) present   . Alcoholism in remission ([place])   . Anemia   . Aortic insufficiency   . Arthritis   . At moderate risk for fall   . Bell's palsy   . Cardiomyopathy ([place])   . Chronic systolic heart failure (HCC)    NYHA Class III  . CKD (chronic kidney disease) stage 3, GFR 30-59 ml/min (HCC)   . COPD (chronic obstructive pulmonary disease) ([place])   . Essential hypertension, benign   . HIV disease ([place]) 09/06/2016  . Hyperlipidemia   . Noncompliance with medication regimen   . Nonischemic cardiomyopathy (HCC)    EF 15-20%  . NSVT (nonsustained ventricular tachycardia) ([place])   . Numbness of right jaw    Had a stoke in 01/2013. Numbness is occasional, especially when trying to chew.  . Productive cough 08/2013   With brown sputum   . SOB (shortness of breath) on exertion 08/2013  .  Stroke ([place]) 01/2013   weakness of right side from CVA  . Type 2 diabetes mellitus ([place])   . Uses hearing aid 2014   recently received new hearing aids   Past Surgical History:  Procedure Laterality Date  . BIOPSY N/A 04/10/2014   Procedure: GASTRIC BIOPSY;  Surgeon: Daneil Dolin, MD;  Location: [facility];  Service: Endoscopy;  Laterality: N/A;  . BIOPSY  10/12/2015   Procedure: BIOPSY;  Surgeon: Daneil Dolin, MD;  Location: [facility];  Service: Endoscopy;;  stomach bx's  . CARDIAC DEFIBRILLATOR PLACEMENT  11/12/12   [employer] Inogen MINI ICD implanted in [place] at [facility] per Dr Nelly Laurence note  . COLONOSCOPY WITH PROPOFOL N/A 04/10/2014   RMR:  Colonic Diverticulosis  . ESOPHAGOGASTRODUODENOSCOPY (EGD) WITH PROPOFOL N/A 04/10/2014   RMR: Mild erosive reflux esophagitis. Multiple antral polyps likely hyperplastic status post removal by hot snare cautery technique. Diffusely abnormal stomach status post gastric biopsy I suspect some of patients anemaia may be due to intermittent oozing from the stomach. It would be difficult and a risky proposition to attempt complete removal of all of his gastric polyps.  . ESOPHAGOGASTRODUODENOSCOPY (EGD) WITH PROPOFOL N/A 10/12/2015   Procedure: ESOPHAGOGASTRODUODENOSCOPY (EGD) WITH PROPOFOL;  Surgeon: Daneil Dolin, MD;  Location: [facility];  Service: Endoscopy;  Laterality: N/A;  8101 - moved to 9:15  . POLYPECTOMY N/A 04/10/2014   Procedure: GASTRIC POLYPECTOMY;  Surgeon: Daneil Dolin, MD;  Location: [facility];  Service: Endoscopy;  Laterality: N/A;  . RIGHT HEART CATHETERIZATION N/A 02/06/2014   Procedure: RIGHT HEART CATH;  Surgeon: Larey Dresser, MD;  Location: [facility] CATH LAB;  Service: Cardiovascular;  Laterality: N/A;   Family History  Problem Relation Age of Onset  . Kidney disease Mother   . Kidney disease Sister   . Colon cancer Neg Hx    Social History   Socioeconomic History  . Marital status: Single    Spouse name: Not on file  . Number of children: Not on file  . Years of education: Not on file  . Highest education level: Not on file  Occupational History  . Occupation: disabled  Social Needs  . Financial resource strain: Not hard at all  . Food insecurity:    Worry: Never true    Inability: Never true  . Transportation needs:    Medical: No    Non-medical: No  Tobacco Use  . Smoking status: Former Smoker    Types: Cigarettes    Last attempt to quit: 11/05/1993    Years since quitting: 24.3  . Smokeless tobacco: Never Used  Substance and Sexual Activity  . Alcohol use: Yes    Comment: former heavy ETOH, none recently  . Drug use: No    Comment: prior history of  cocaine use, smoking   . Sexual activity: Yes    Birth control/protection: None  Lifestyle  . Physical activity:    Days per week: 4 days    Minutes per session: 20 min  . Stress: Not at all  Relationships  . Social connections:    Talks on phone: Never    Gets together: Once a week    Attends religious service: Never    Active member of club or organization: No    Attends meetings of clubs or organizations: Never    Relationship status: Never married  Other Topics Concern  . Not on file  Social History Narrative   Recently moved from [place] to be with family in [place] (2015).  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  Has h/o polysubstance abuse.  Baseline - able to ambulate, mild cognitive delay, able to toilet and shower himself, occasional use of walker/cane.   2019- resident of [facility]. Unable to ambulate unassisted.             Outpatient Encounter Medications as of 03/05/2018  Medication Sig  . acetaminophen (TYLENOL) 325 MG tablet Take 2 tablets (650 mg total) by mouth every 4 (four) hours as needed for mild pain (or temp > 37.5 C (99.5 F)).  Marland Kitchen aspirin 325 MG tablet Take 1 tablet (325 mg total) by mouth daily.  Marland Kitchen atorvastatin (LIPITOR) 80 MG tablet Take 80 mg by mouth daily.   Marland Kitchen atovaquone (MEPRON) 750 MG/5ML suspension Take 10 mLs (1,500 mg total) by mouth daily with breakfast.  . carvedilol (COREG) 3.125 MG tablet Take 1 tablet (3.125 mg total) by mouth 2 (two) times daily with a meal.  . Cholecalciferol (VITAMIN D3) 2000 UNITS TABS Take 2,000 mg by mouth daily.  . coal tar (NEUTROGENA T-GEL) 0.5 % shampoo Apply to scalp on shower days (Twice a week) for dandruff and dry scalp Wed., and Sat.  . digoxin (LANOXIN) 0.125 MG tablet TAKE 1/2 TABLET BY MOUTH DAILY  . docusate sodium (COLACE) 100 MG capsule Take 100 mg by mouth daily.  . dolutegravir (TIVICAY) 50 MG tablet Take 1 tablet (50 mg total) by mouth daily.  Marland Kitchen  emtricitabine-tenofovir AF (DESCOVY) 200-25 MG tablet Take 1 tablet by mouth daily.  . ferrous sulfate 325 (65 FE) MG tablet Take 325 mg by mouth daily with breakfast.  . furosemide (LASIX) 20 MG tablet Take 20 mg by mouth daily.  . isosorbide dinitrate (ISORDIL) 10 MG tablet Take 10 mg by mouth 3 (three) times daily.   . isosorbide mononitrate (IMDUR) 30 MG 24 hr tablet   . Multiple Vitamin (MULTIVITAMIN WITH MINERALS) TABS tablet Take 1 tablet by mouth daily.  . ranitidine (ZANTAC) 150 MG tablet Take 150 mg by mouth at bedtime.  . senna-docusate (SENEXON-S) 8.6-50 MG tablet Take 1 tablet by mouth daily.  . sitaGLIPtin (JANUVIA) 50 MG tablet Take 50 mg by mouth daily.  . tamsulosin (FLOMAX) 0.4 MG CAPS capsule Take 0.4 mg by mouth daily.   No facility-administered encounter medications on file as of 03/05/2018.     Activities of Daily Living In your present state of health, do you have any difficulty performing the following activities: 03/05/2018  Hearing? Y  Vision? N  Difficulty concentrating or making decisions? N  Walking or climbing stairs? Y  Dressing or bathing? Y  Doing errands, shopping? Y  Preparing Food and eating ? Y  Using the Toilet? N  In the past six months, have you accidently leaked urine? N  Do you have problems with loss of bowel control? N  Managing your Medications? Y  Managing your Finances? Y  Housekeeping or managing your Housekeeping? Y  Some recent data might be hidden    Patient Care Team: Sinda Du, MD as PCP - General (Pulmonary Disease) Gala Romney Cristopher Estimable, MD as Consulting Physician (Gastroenterology)   Assessment:   This is a routine wellness examination for Kamali.  Exercise Activities and Dietary recommendations Current Exercise Habits: Home exercise routine, Type of exercise: walking(with assist), Time (Minutes): 20, Frequency (Times/Week): 4, Weekly Exercise (Minutes/Week): 80, Intensity: Mild,  Exercise limited by: orthopedic  condition(s)  Goals   None     Fall Risk Fall Risk  03/05/2018 04/18/2017 02/23/2017 11/29/2016 11/29/2016  Falls in the past year? No No No No No   Is the patient's home free of loose throw rugs in walkways, pet beds, electrical cords, etc?   yes      Grab bars in the bathroom? yes      Handrails on the stairs?   yes      Adequate lighting?   yes  Depression Screen PHQ 2/9 Scores 03/05/2018 04/18/2017 02/23/2017 11/29/2016  PHQ - 2 Score 0 0 0 0    Cognitive Function     6CIT Screen 03/05/2018 02/23/2017  What Year? 0 points 0 points  What month? 0 points 0 points  What time? 0 points 0 points  Count back from 20 0 points 0 points  Months in reverse 4 points 4 points  Repeat phrase 4 points 10 points  Total Score 8 14    Immunization History  Administered Date(s) Administered  . Hepatitis A, Adult 09/19/2016  . Hepatitis B, adult 09/19/2016  . Influenza-Unspecified 03/13/2015, 07/07/2016, 03/03/2017  . Pneumococcal Conjugate-13 03/03/2017  . Pneumococcal Polysaccharide-23 07/08/2016, 09/19/2016  . Tdap 02/23/2017    Qualifies for Shingles Vaccine? Not in past records  Screening Tests Health Maintenance  Topic Date Due  . FOOT EXAM  02/10/1962  . OPHTHALMOLOGY EXAM  02/10/1962  . URINE MICROALBUMIN  02/10/1962  . HEMOGLOBIN A1C  07/15/2018  . PNA vac Low Risk Adult (2 of 2 - PPSV23) 09/19/2021  . COLONOSCOPY  04/10/2024  . TETANUS/TDAP  02/24/2027  . Hepatitis C Screening  Completed   Cancer Screenings: Lung: Low Dose CT Chest recommended if Age 64-80 years, 30 pack-year currently smoking OR have quit w/in 15years. Patient does not qualify. Colorectal: up to date  Additional Screenings:  Hepatitis C Screening: declined Flu vaccine due:  excluded  Diabetic eye exam due: ordered Diabetic foot exam due: ordered    Plan:    I have personally reviewed and addressed the Medicare Annual Wellness questionnaire and have noted the following in the patient's chart:   A. Medical and social history B. Use of alcohol, tobacco or illicit drugs  C. Current medications and supplements D. Functional ability and status E.  Nutritional status F.  Physical activity G. Advance directives H. List of other physicians I.  Hospitalizations, surgeries, and ER visits in previous 12 months J.  [place] to include hearing, vision, cognitive, depression L. Referrals and appointments - none  In addition, I have reviewed and discussed with patient certain preventive protocols, quality metrics, and best practice recommendations. A written personalized care plan for preventive services as well as general preventive health recommendations were provided to patient.  See attached scanned questionnaire for additional information.   Signed,   Tyson Dense, RN Nurse Health Advisor  Patient concerns: None

## 2018-03-22 ENCOUNTER — Non-Acute Institutional Stay (SKILLED_NURSING_FACILITY): Payer: Medicare HMO | Admitting: Internal Medicine

## 2018-03-22 ENCOUNTER — Encounter: Payer: Self-pay | Admitting: Internal Medicine

## 2018-03-22 DIAGNOSIS — N183 Chronic kidney disease, stage 3 unspecified: Secondary | ICD-10-CM

## 2018-03-22 DIAGNOSIS — E119 Type 2 diabetes mellitus without complications: Secondary | ICD-10-CM

## 2018-03-22 DIAGNOSIS — I10 Essential (primary) hypertension: Secondary | ICD-10-CM

## 2018-03-22 DIAGNOSIS — D631 Anemia in chronic kidney disease: Secondary | ICD-10-CM

## 2018-03-22 DIAGNOSIS — N189 Chronic kidney disease, unspecified: Secondary | ICD-10-CM | POA: Diagnosis not present

## 2018-03-22 DIAGNOSIS — Z95810 Presence of automatic (implantable) cardiac defibrillator: Secondary | ICD-10-CM

## 2018-03-22 DIAGNOSIS — I5042 Chronic combined systolic (congestive) and diastolic (congestive) heart failure: Secondary | ICD-10-CM

## 2018-03-22 DIAGNOSIS — B20 Human immunodeficiency virus [HIV] disease: Secondary | ICD-10-CM

## 2018-03-22 NOTE — Progress Notes (Signed)
Location:    [facility] Room Number: 148/D Place of Service:  SNF [id number]) Provider: Murray Hodgkins, MD  Patient Care Team: Sinda Du, MD as PCP - General (Pulmonary Disease) Gala Romney Cristopher Estimable, MD as Consulting Physician (Gastroenterology)  Extended Emergency Contact Information Primary Emergency Contact: [facility] Address: [address] [place] [address] [place] Phone: [phone] Relation: Relative Secondary Emergency Contact: [facility] Address: [address] [place] [address] [place] Phone: [phone] Relation: Sister  Code Status:  DNR Goals of care: Advanced Directive information Advanced Directives 03/22/2018  Does Patient Have a Medical Advance Directive? Yes  Type of Advance Directive Out of facility DNR (pink MOST or yellow form)  Does patient want to make changes to medical advance directive? No - Patient declined  Copy of [place] in Chart? No - copy requested  Would patient like information on creating a medical advance directive? -  Pre-existing out of facility DNR order (yellow form or pink MOST form) -   Chief complaint-medical management of chronic medical conditions including chronic CHF with cardiomyopathy ejection fraction 30% on echo done in July 2018- chronic kidney disease stage III days HIV followed by infectious disease as well as status post AICD-in addition to hypertension-CVA-chronic anemia- hyperlipidemia-type 2 diabetes and COPD    HPI:  Pt is a 67 y.o. male seen today for medical management of chronic diseases.  As noted above.  Patient continues to be quite stable nursing does not really report any acute issues- he does not have any complaints today.  He is currently resting in bed comfortably I often see him up in the wheelchair as well.  His weight is stable at 160 pounds.  He does have  numerous diagnoses as noted above including anemia of chronic disease-he has received Aranesp injections in the past.  Labs have been stable with a hemoglobin of 14.1 on lab done in late August-he is followed by hematology oncology.  There are plans to return to clinic in February 2020 with repeat labs-he does have mild leukopenia at 3.4 but this is thought possibly a normal variant or HIV related-he is followed by infectious disease as well for his HIV and is stable on current medications.  .  In regards to CHF again his weight appears to be stable he does not really appear to have significant edema complaints of shortness of breath- he is on digoxin Coreg and Imdur and has been followed by Dr. Lovena Le he does have a history of AICD and this is followed by cardiology will write an order to make sure there is cardiology follow-up as needed.  Regards chronic kidney disease stage III creatinine was 2.68 on most recent lab will update this he is followed by nephrology and will see them in approximately a month.  Regards to hypertension this is stable on low-dose Coreg systolics appear to be in the 130-140 range.  He is a type II diabetic which is controlled on Januvia CBGs are largely 90s to low 100s hemoglobin A1c was 7.2 back in August.  Regards to his CVA history continues on aspirin his LDL was 27 on an April lab he is on a statin as well.  In regards to urinary retention this is stabilized on Flomax.  He also has a history of vitamin D deficiency  which is been stable on supplementation last lab was done in August and was within normal limits.  Currently he has no complaints vital signs again appear to be stable         Past Medical History:  Diagnosis Date  . AICD (automatic cardioverter/defibrillator) present   . Alcoholism in remission ([place])   . Anemia   . Aortic insufficiency   . Arthritis   . At moderate risk for fall   . Bell's palsy   . Cardiomyopathy ([place])   . Chronic  systolic heart failure (HCC)    NYHA Class III  . CKD (chronic kidney disease) stage 3, GFR 30-59 ml/min (HCC)   . COPD (chronic obstructive pulmonary disease) ([place])   . Essential hypertension, benign   . HIV disease ([place]) 09/06/2016  . Hyperlipidemia   . Noncompliance with medication regimen   . Nonischemic cardiomyopathy (HCC)    EF 15-20%  . NSVT (nonsustained ventricular tachycardia) ([place])   . Numbness of right jaw    Had a stoke in 01/2013. Numbness is occasional, especially when trying to chew.  . Productive cough 08/2013   With brown sputum   . SOB (shortness of breath) on exertion 08/2013  . Stroke ([place]) 01/2013   weakness of right side from CVA  . Type 2 diabetes mellitus ([place])   . Uses hearing aid 2014   recently received new hearing aids   Past Surgical History:  Procedure Laterality Date  . BIOPSY N/A 04/10/2014   Procedure: GASTRIC BIOPSY;  Surgeon: Daneil Dolin, MD;  Location: [facility] ORS;  Service: Endoscopy;  Laterality: N/A;  . BIOPSY  10/12/2015   Procedure: BIOPSY;  Surgeon: Daneil Dolin, MD;  Location: [facility] ENDO SUITE;  Service: Endoscopy;;  stomach bx's  . CARDIAC DEFIBRILLATOR PLACEMENT  11/12/12   [employer] Inogen MINI ICD implanted in [place] at [place] [facility] per Dr Nelly Laurence note  . COLONOSCOPY WITH PROPOFOL N/A 04/10/2014   RMR: Colonic Diverticulosis  . ESOPHAGOGASTRODUODENOSCOPY (EGD) WITH PROPOFOL N/A 04/10/2014   RMR: Mild erosive reflux esophagitis. Multiple antral polyps likely hyperplastic status post removal by hot snare cautery technique. Diffusely abnormal stomach status post gastric biopsy I suspect some of patients anemaia may be due to intermittent oozing from the stomach. It would be difficult and a risky proposition to attempt complete removal of all of his gastric polyps.  . ESOPHAGOGASTRODUODENOSCOPY (EGD) WITH PROPOFOL N/A 10/12/2015   Procedure: ESOPHAGOGASTRODUODENOSCOPY (EGD) WITH  PROPOFOL;  Surgeon: Daneil Dolin, MD;  Location: [facility];  Service: Endoscopy;  Laterality: N/A;  3762 - moved to 9:15  . POLYPECTOMY N/A 04/10/2014   Procedure: GASTRIC POLYPECTOMY;  Surgeon: Daneil Dolin, MD;  Location: [facility];  Service: Endoscopy;  Laterality: N/A;  . RIGHT HEART CATHETERIZATION N/A 02/06/2014   Procedure: RIGHT HEART CATH;  Surgeon: Larey Dresser, MD;  Location: [facility] CATH LAB;  Service: Cardiovascular;  Laterality: N/A;    No Known Allergies  Outpatient Encounter Medications as of 03/22/2018  Medication Sig  . acetaminophen (TYLENOL) 325 MG tablet Take 2 tablets (650 mg total) by mouth every 4 (four) hours as needed for mild pain (or temp > 37.5 C (99.5 F)).  Marland Kitchen aspirin 325 MG tablet Take 1 tablet (325 mg total) by mouth daily.  Marland Kitchen atorvastatin (LIPITOR) 80 MG tablet Take 80 mg by mouth daily.   Marland Kitchen atovaquone (MEPRON) 750 MG/5ML suspension Take 10 mLs (1,500 mg  total) by mouth daily with breakfast.  . carvedilol (COREG) 3.125 MG tablet Take 1 tablet (3.125 mg total) by mouth 2 (two) times daily with a meal.  . Cholecalciferol (VITAMIN D3) 2000 UNITS TABS Take 2,000 mg by mouth daily.  . coal tar (NEUTROGENA T-GEL) 0.5 % shampoo Apply to scalp on shower days (Twice a week) for dandruff and dry scalp Wed., and Sat.  . digoxin (LANOXIN) 0.125 MG tablet TAKE 1/2 TABLET BY MOUTH DAILY  . docusate sodium (COLACE) 100 MG capsule Take 100 mg by mouth daily.  . dolutegravir (TIVICAY) 50 MG tablet Take 1 tablet (50 mg total) by mouth daily.  Marland Kitchen emtricitabine-tenofovir AF (DESCOVY) 200-25 MG tablet Take 1 tablet by mouth daily.  . isosorbide mononitrate (IMDUR) 30 MG 24 hr tablet Take 30 mg by mouth daily.   . Multiple Vitamin (MULTIVITAMIN WITH MINERALS) TABS tablet Take 1 tablet by mouth daily.  . ranitidine (ZANTAC) 150 MG tablet Take 150 mg by mouth at bedtime.  . senna-docusate (SENEXON-S) 8.6-50 MG tablet Take 1 tablet by mouth daily.  . sitaGLIPtin (JANUVIA) 50 MG  tablet Take 50 mg by mouth daily.  . tamsulosin (FLOMAX) 0.4 MG CAPS capsule Take 0.4 mg by mouth daily.  . [DISCONTINUED] ferrous sulfate 325 (65 FE) MG tablet Take 325 mg by mouth daily with breakfast.  . [DISCONTINUED] furosemide (LASIX) 20 MG tablet Take 20 mg by mouth daily.  . [DISCONTINUED] isosorbide dinitrate (ISORDIL) 10 MG tablet Take 10 mg by mouth 3 (three) times daily.    No facility-administered encounter medications on file as of 03/22/2018.      Review of Systems   In general is not complaining of any fever chills weight appears to be stable.  Skin does not complain of rashes or itching.  Head ears eyes nose mouth and throat is not complaining of any visual changes or sore throat.  Respiratory denies shortness of breath or cough.  Cardiac does not complain of chest pain or significant lower extremity edema.  GI does not complain of abdominal pain nausea vomiting diarrhea constipation.  GU does not complain of dysuria.  Musculoskeletal has somewhat of a frail appearance but is not complain of joint pain.  He did at one point complain of left shoulder pain in the past actually seen orthopedics and received a steroid injection in the past --currently receives Biofreeze as needed  Neurologic does not complain of headache dizziness or syncope.  Psych does not complain of being depressed or anxious nursing does not report any behavior issues  Immunization History  Administered Date(s) Administered  . Hepatitis A, Adult 09/19/2016  . Hepatitis B, adult 09/19/2016  . Influenza-Unspecified 03/13/2015, 07/07/2016, 03/03/2017  . Pneumococcal Conjugate-13 03/03/2017  . Pneumococcal Polysaccharide-23 07/08/2016, 09/19/2016  . Tdap 02/23/2017   Pertinent  Health Maintenance Due  Topic Date Due  . FOOT EXAM  04/22/2018 (Originally 02/10/1961)  . OPHTHALMOLOGY EXAM  04/22/2018 (Originally 02/10/1961)  . URINE MICROALBUMIN  04/22/2018 (Originally 02/10/1961)  . HEMOGLOBIN  A1C  07/15/2018  . PNA vac Low Risk Adult (2 of 2 - PPSV23) 09/19/2021  . COLONOSCOPY  04/10/2024   Fall Risk  03/05/2018 04/18/2017 02/23/2017 11/29/2016 11/29/2016  Falls in the past year? No No No No No   Functional Status Survey:    Vitals:   03/22/18 1525  BP: 125/70  Pulse: 63  Resp: 18  Temp: 98.4 F (36.9 C)  TempSrc: Oral  SpO2: 94%  Weight: 161 lb 12.8 oz (73.4 kg)  Height: 5\' 3"  (1.6 m)   Body mass index is 28.66 kg/m. Physical Exam   In general this is a pleasant elderly male in no distress.  His skin is warm and dry--I do not note any lesions-foot exam could not really appreciate any significant open areas or wounds.  Eyes visual acuity appears to be intact sclera and conjunctive are clear.  Oropharynx is clear mucous membranes moist.  Chest is clear to auscultation there is no labored breathing.  Heart is regular rate and rhythm with a systolic murmur-he does not have significant lower extremity edema.  His abdomen is soft nontender with positive bowel sounds.  Muscular skeletal is able to move all extremities x4 he ambulates in a wheelchair moves his extremities at baseline with baseline lower extremity weakness- somewhat limited exam since he is in bed.  Neurologic is grossly intact his speech is clear no lateralizing findings.  Psych he is oriented to self he is pleasant and appropriate  Labs reviewed: Recent Labs    06/26/17 0430  10/31/17 0715 01/02/18 0300 01/19/18 1316  NA 136   < > 139 137 135  K 4.6   < > 4.7 4.4 4.9  CL 101   < > 105 106 105  CO2 25   < > 28 25 23   GLUCOSE 143*   < > 117* 139* 153*  BUN 54*   < > 55* 49* 42*  CREATININE 2.33*   < > 2.84* 2.88* 2.68*  CALCIUM 9.7   < > 9.5 9.0 9.1  PHOS 3.3  --  3.6 3.5  --    < > = values in this interval not displayed.   Recent Labs    07/26/17 1032 10/25/17 1309 10/31/17 0715 01/02/18 0300 01/19/18 1316  AST 28 29  --   --  25  ALT 35 46  --   --  29  ALKPHOS 102 92  --   --   93  BILITOT 0.5 0.7  --   --  0.8  PROT 8.0 8.1  --   --  7.5  ALBUMIN 3.5 3.4* 3.4* 3.1* 3.4*   Recent Labs    09/25/17 0620 10/25/17 1309 10/31/17 0715 01/02/18 0441 01/19/18 1316  WBC 3.7* 4.0  --   --  3.4*  NEUTROABS 2.1 2.3  --   --  2.1  HGB 14.0 13.2 14.2 16.8 14.1  HCT 44.9 40.9 44.9 52.7* 43.3  MCV 92.6 90.9  --   --  91.0  PLT 135* 153  --   --  109*   Lab Results  Component Value Date   TSH 3.661 02/15/2017   Lab Results  Component Value Date   HGBA1C 7.2 (H) 01/12/2018   Lab Results  Component Value Date   CHOL 73 09/25/2017   HDL 26 (L) 09/25/2017   LDLCALC 27 09/25/2017   TRIG 101 09/25/2017   CHOLHDL 2.8 09/25/2017    Significant Diagnostic Results in last 30 days:  No results found.  Assessment/Plan  #1 history of chronic anemia this appears to be stable he is followed by hematology oncology hemoglobin was 14.1 on lab done in August will update this- he does have leukopenia but this is thought to be possibly normal variant or HIV related- clinically appears to be stable.  2.  History of HIV again he is followed by infectious disease continues on medications including Atovaquone  And Descovy-- He is thought to be doing well and recommendation per Mayvisit was  to return to clinic in approximately 6 months which should be later this year.   #3- history of CHF with ejection fraction of 30% on July 2018 echo-thought to be also an element of diastolic dysfunction-continues on digoxin Coreg and Imdur-he is followed by cardiology Dr. Lovena Le- he does have a history of AICD as well which is followed by cardiology.  Will write an order to ensure that he does have cardiology follow-up if needed.  He appears to be doing well weight is stable edema is very scant- he does not complain of shortness of breath or chest pain.  Will update a digoxin level.  4.  History of chronic kidney disease this is stable with a creatinine of 2.68 on most recent lab in August  he is followed by nephrology and apparently will have follow-up late next month Lasix was discontinued by nephrology back in August.  5.-  History of hypertension as noted above this is stable on low-dose Coreg with systolics in the 0/17/7939 range largely.  6.  History of type 2 diabetes this is controlled on Januvia with blood sugars largely in the lower 100s A1c was 7.2 in August which is shown long-term stability.  7.  History of CVA he continues on aspirin and his statin LDL was 27 on April lab at this point continue statin and monitor periodically.  8.  History of urinary retention he is on Flomax and this has not been an issue in some time to my knowledge he is not complaining of any dysuria today.  9.  History of vitamin D deficiency per chart review this is stable as well with normal vitamin D level in August 45.4.  10.  Constipation he continues on Colace and  Senexon-S and this appears to be stabilized.-0-no recent complaints of constipation abdominal exam was benign   [id number]-of note greater than 35 minutes spent assessing patient-reviewing his chart and labs and consult notes- and coordinating and formulating a plan of care for numerous diagnoses- of note greater than 50% of time spent coordinating a plan of care

## 2018-03-23 ENCOUNTER — Encounter (HOSPITAL_COMMUNITY)
Admission: RE | Admit: 2018-03-23 | Discharge: 2018-03-23 | Disposition: A | Discharge status: Home / Self Care | Payer: Medicare HMO | Source: Skilled Nursing Facility | Attending: Internal Medicine | Admitting: Internal Medicine

## 2018-03-23 DIAGNOSIS — I110 Hypertensive heart disease with heart failure: Secondary | ICD-10-CM | POA: Diagnosis not present

## 2018-03-23 DIAGNOSIS — I639 Cerebral infarction, unspecified: Secondary | ICD-10-CM | POA: Insufficient documentation

## 2018-03-23 DIAGNOSIS — I5022 Chronic systolic (congestive) heart failure: Secondary | ICD-10-CM | POA: Diagnosis present

## 2018-03-23 LAB — CBC WITH DIFFERENTIAL/PLATELET
Abs Immature Granulocytes: 0.01 10*3/uL (ref 0.00–0.07)
Basophils Absolute: 0 10*3/uL (ref 0.0–0.1)
Basophils Relative: 0 %
EOS ABS: 0.3 10*3/uL (ref 0.0–0.5)
EOS PCT: 6 %
HEMATOCRIT: 46.2 % (ref 39.0–52.0)
Hemoglobin: 14.5 g/dL (ref 13.0–17.0)
Immature Granulocytes: 0 %
LYMPHS ABS: 0.7 10*3/uL (ref 0.7–4.0)
Lymphocytes Relative: 17 %
MCH: 28.8 pg (ref 26.0–34.0)
MCHC: 31.4 g/dL (ref 30.0–36.0)
MCV: 91.7 fL (ref 80.0–100.0)
MONO ABS: 0.7 10*3/uL (ref 0.1–1.0)
MONOS PCT: 16 %
NEUTROS PCT: 61 %
Neutro Abs: 2.4 10*3/uL (ref 1.7–7.7)
Platelets: 166 10*3/uL (ref 150–400)
RBC: 5.04 MIL/uL (ref 4.22–5.81)
RDW: 13.2 % (ref 11.5–15.5)
WBC: 4 10*3/uL (ref 4.0–10.5)
nRBC: 0 % (ref 0.0–0.2)

## 2018-03-23 LAB — BASIC METABOLIC PANEL
ANION GAP: 6 (ref 5–15)
BUN: 40 mg/dL — ABNORMAL HIGH (ref 8–23)
CALCIUM: 9.3 mg/dL (ref 8.9–10.3)
CO2: 25 mmol/L (ref 22–32)
Chloride: 105 mmol/L (ref 98–111)
Creatinine, Ser: 2.54 mg/dL — ABNORMAL HIGH (ref 0.61–1.24)
GFR calc Af Amer: 29 mL/min — ABNORMAL LOW (ref >60)
GFR calc non Af Amer: 25 mL/min — ABNORMAL LOW (ref >60)
GLUCOSE: 137 mg/dL — AB (ref 70–99)
POTASSIUM: 4.5 mmol/L (ref 3.5–5.1)
Sodium: 136 mmol/L (ref 135–145)

## 2018-03-24 ENCOUNTER — Encounter: Payer: Self-pay | Admitting: Internal Medicine

## 2018-03-27 LAB — PHENYTOIN LEVEL, FREE AND TOTAL: PHENYTOIN FREE: NOT DETECTED ug/mL (ref 1.0–2.0)

## 2018-04-17 ENCOUNTER — Encounter (HOSPITAL_COMMUNITY)
Admission: RE | Admit: 2018-04-17 | Discharge: 2018-04-17 | Disposition: A | Discharge status: Home / Self Care | Payer: Medicare HMO | Source: Skilled Nursing Facility | Attending: Internal Medicine | Admitting: Internal Medicine

## 2018-04-17 DIAGNOSIS — E1129 Type 2 diabetes mellitus with other diabetic kidney complication: Secondary | ICD-10-CM | POA: Diagnosis not present

## 2018-04-17 DIAGNOSIS — I5022 Chronic systolic (congestive) heart failure: Secondary | ICD-10-CM | POA: Diagnosis not present

## 2018-04-17 DIAGNOSIS — I130 Hypertensive heart and chronic kidney disease with heart failure and stage 1 through stage 4 chronic kidney disease, or unspecified chronic kidney disease: Secondary | ICD-10-CM | POA: Insufficient documentation

## 2018-04-17 DIAGNOSIS — N189 Chronic kidney disease, unspecified: Secondary | ICD-10-CM | POA: Diagnosis not present

## 2018-04-17 LAB — RENAL FUNCTION PANEL
Albumin: 3.1 g/dL — ABNORMAL LOW (ref 3.5–5.0)
Anion gap: 7 (ref 5–15)
BUN: 36 mg/dL — ABNORMAL HIGH (ref 8–23)
CHLORIDE: 110 mmol/L (ref 98–111)
CO2: 23 mmol/L (ref 22–32)
Calcium: 9 mg/dL (ref 8.9–10.3)
Creatinine, Ser: 2.56 mg/dL — ABNORMAL HIGH (ref 0.61–1.24)
GFR, EST AFRICAN AMERICAN: 28 mL/min — AB (ref >60)
GFR, EST NON AFRICAN AMERICAN: 25 mL/min — AB (ref >60)
Glucose, Bld: 121 mg/dL — ABNORMAL HIGH (ref 70–99)
Phosphorus: 3.4 mg/dL (ref 2.5–4.6)
Potassium: 4.6 mmol/L (ref 3.5–5.1)
Sodium: 140 mmol/L (ref 135–145)

## 2018-04-17 LAB — CBC
HEMATOCRIT: 43.9 % (ref 39.0–52.0)
HEMOGLOBIN: 13.4 g/dL (ref 13.0–17.0)
MCH: 28 pg (ref 26.0–34.0)
MCHC: 30.5 g/dL (ref 30.0–36.0)
MCV: 91.8 fL (ref 80.0–100.0)
NRBC: 0 % (ref 0.0–0.2)
Platelets: 112 10*3/uL — ABNORMAL LOW (ref 150–400)
RBC: 4.78 MIL/uL (ref 4.22–5.81)
RDW: 13.8 % (ref 11.5–15.5)
WBC: 3.3 10*3/uL — AB (ref 4.0–10.5)

## 2018-04-17 LAB — IRON AND TIBC
IRON: 40 ug/dL — AB (ref 45–182)
SATURATION RATIOS: 15 % — AB (ref 17.9–39.5)
TIBC: 260 ug/dL (ref 250–450)
UIBC: 220 ug/dL

## 2018-04-17 LAB — FERRITIN: FERRITIN: 39 ng/mL (ref 24–336)

## 2018-04-17 LAB — ALBUMIN: ALBUMIN: 3.2 g/dL — AB (ref 3.5–5.0)

## 2018-04-17 LAB — PHOSPHORUS: Phosphorus: 3.5 mg/dL (ref 2.5–4.6)

## 2018-04-18 ENCOUNTER — Encounter: Payer: Self-pay | Admitting: Internal Medicine

## 2018-04-18 ENCOUNTER — Encounter: Payer: Medicare HMO | Admitting: Internal Medicine

## 2018-04-18 ENCOUNTER — Ambulatory Visit (INDEPENDENT_AMBULATORY_CARE_PROVIDER_SITE_OTHER): Payer: Medicare HMO | Admitting: Internal Medicine

## 2018-04-18 VITALS — BP 112/73 | HR 70 | Temp 98.0°F | Ht 63.0 in | Wt 162.2 lb

## 2018-04-18 DIAGNOSIS — B20 Human immunodeficiency virus [HIV] disease: Secondary | ICD-10-CM

## 2018-04-18 DIAGNOSIS — N183 Chronic kidney disease, stage 3 unspecified: Secondary | ICD-10-CM

## 2018-04-18 DIAGNOSIS — Z9189 Other specified personal risk factors, not elsewhere classified: Secondary | ICD-10-CM

## 2018-04-18 DIAGNOSIS — Z23 Encounter for immunization: Secondary | ICD-10-CM | POA: Diagnosis not present

## 2018-04-18 LAB — VITAMIN D 25 HYDROXY (VIT D DEFICIENCY, FRACTURES): VIT D 25 HYDROXY: 38 ng/mL (ref 30.0–100.0)

## 2018-04-18 LAB — PARATHYROID HORMONE, INTACT (NO CA): PTH: 52 pg/mL (ref 15–65)

## 2018-04-18 MED ORDER — BICTEGRAVIR-EMTRICITAB-TENOFOV 50-200-25 MG PO TABS: 1.0000 | ORAL_TABLET | Freq: Every day | ORAL | 11 refills | Status: DC

## 2018-04-18 MED ORDER — BICTEGRAVIR-EMTRICITAB-TENOFOV 50-200-25 MG PO TABS: 1.0000 | ORAL_TABLET | Freq: Every day | ORAL | 11 refills | Status: AC

## 2018-04-18 NOTE — Assessment & Plan Note (Signed)
His CD4 count has been over 200, he can stop the [employer]

## 2018-04-18 NOTE — Progress Notes (Signed)
Patient ID: GHASSAN COGGESHALL, male   DOB: 06/28/1950, 67 y.o.   MRN: [id number]   Subjective:    Patient ID: TRINTON PREWITT, male    DOB: [birth year], 67 y.o.   MRN: [id number]  HPI Here for follow up of HIV  Doing well with continued good weight gain and eating well.  He denies any missed doses and medicaiton is [facility].  He does not have any complaints.  Creat remains 2 - 2.3.  Taking Mepron as well for OI prophylaxis still.  No associated n/v/d.     Review of Systems  HENT: Negative for trouble swallowing.   Gastrointestinal: Negative for diarrhea.  Skin: Negative for rash.       Objective:   Physical Exam  Constitutional:  Healthier appearing  HENT:  Mouth/Throat: No oropharyngeal exudate.  Eyes: No scleral icterus.  Cardiovascular: Normal rate, regular rhythm and normal heart sounds.  Pulmonary/Chest: Effort normal and breath sounds normal. No respiratory distress.  Lymphadenopathy:    He has no cervical adenopathy.  Skin: No rash noted.   SH: lives at the [facility]:

## 2018-04-18 NOTE — Assessment & Plan Note (Signed)
I will change his regimen to [facility] which he can use with any GFR so no dose adjustement will be needed.

## 2018-04-18 NOTE — Assessment & Plan Note (Signed)
Doing well.  I will change him to [facility] for ease of dosing.   rtc 6 months

## 2018-04-19 LAB — T-HELPER CELL (CD4) - (RCID CLINIC ONLY)
CD4 T CELL ABS: 240 /uL — AB (ref 400–2700)
CD4 T CELL HELPER: 41 % (ref 33–55)

## 2018-04-20 ENCOUNTER — Encounter (HOSPITAL_COMMUNITY)
Admission: RE | Admit: 2018-04-20 | Discharge: 2018-04-20 | Disposition: A | Discharge status: Home / Self Care | Payer: Medicare HMO | Source: Skilled Nursing Facility | Attending: Gastroenterology | Admitting: Gastroenterology

## 2018-04-20 DIAGNOSIS — E1129 Type 2 diabetes mellitus with other diabetic kidney complication: Secondary | ICD-10-CM | POA: Diagnosis not present

## 2018-04-20 LAB — PROTEIN / CREATININE RATIO, URINE
Creatinine, Urine: 166.89 mg/dL
TOTAL PROTEIN, URINE: 147 mg/dL

## 2018-04-20 LAB — HIV-1 RNA QUANT-NO REFLEX-BLD
HIV 1 RNA Quant: 55 copies/mL — ABNORMAL HIGH
HIV-1 RNA Quant, Log: 1.74 Log copies/mL — ABNORMAL HIGH

## 2018-05-02 ENCOUNTER — Ambulatory Visit (INDEPENDENT_AMBULATORY_CARE_PROVIDER_SITE_OTHER): Payer: Medicare HMO

## 2018-05-02 ENCOUNTER — Telehealth: Payer: Self-pay

## 2018-05-02 DIAGNOSIS — I429 Cardiomyopathy, unspecified: Secondary | ICD-10-CM | POA: Diagnosis not present

## 2018-05-02 NOTE — Progress Notes (Signed)
Remote ICD transmission.   

## 2018-05-02 NOTE — Telephone Encounter (Signed)
Confirmed remote transmission w/ pt assistant living. The lady states the pt no longer lives there. We do not have a working number for the pt.

## 2018-05-03 ENCOUNTER — Encounter: Payer: Medicare HMO | Admitting: Internal Medicine

## 2018-05-08 ENCOUNTER — Encounter: Payer: Self-pay | Admitting: Cardiology

## 2018-05-16 ENCOUNTER — Encounter (HOSPITAL_COMMUNITY)
Admission: RE | Admit: 2018-05-16 | Discharge: 2018-05-16 | Disposition: A | Discharge status: Home / Self Care | Payer: Medicare HMO | Source: Skilled Nursing Facility | Attending: Internal Medicine | Admitting: Internal Medicine

## 2018-05-16 DIAGNOSIS — I130 Hypertensive heart and chronic kidney disease with heart failure and stage 1 through stage 4 chronic kidney disease, or unspecified chronic kidney disease: Secondary | ICD-10-CM | POA: Insufficient documentation

## 2018-05-16 DIAGNOSIS — E1129 Type 2 diabetes mellitus with other diabetic kidney complication: Secondary | ICD-10-CM | POA: Insufficient documentation

## 2018-05-16 DIAGNOSIS — I639 Cerebral infarction, unspecified: Secondary | ICD-10-CM | POA: Diagnosis not present

## 2018-05-16 DIAGNOSIS — I5022 Chronic systolic (congestive) heart failure: Secondary | ICD-10-CM | POA: Insufficient documentation

## 2018-05-17 ENCOUNTER — Encounter: Payer: Self-pay | Admitting: Internal Medicine

## 2018-05-17 NOTE — Progress Notes (Signed)
Location:    [facility] Room Number: 148/D Place of Service:  SNF [phone]) Provider: Veleta Miners MD  Virgie Dad, MD  Patient Care Team: Virgie Dad, MD as PCP - General (Internal Medicine) Evans Lance, MD as PCP - Electrophysiology (Cardiology) Gala Romney Cristopher Estimable, MD as Consulting Physician (Gastroenterology) Rolm Baptise as Physician Assistant (Internal Medicine)  Extended Emergency Contact Information Primary Emergency Contact: [facility] Address: [address], [place] [address] [place] Phone: [phone] Relation: Relative Secondary Emergency Contact: [facility] Address: [address] [place] [address] [place] Phone: [phone] Relation: Sister  Code Status:  DNR Goals of care: Advanced Directive information Advanced Directives 05/17/2018  Does Patient Have a Medical Advance Directive? Yes  Type of Advance Directive Out of facility DNR (pink MOST or yellow form)  Does patient want to make changes to medical advance directive? No - Patient declined  Copy of [place] in Chart? No - copy requested  Would patient like information on creating a medical advance directive? -  Pre-existing out of facility DNR order (yellow form or pink MOST form) -     Chief Complaint  Patient presents with  . Medical Management of Chronic Issues    Routine visit of medical management, Due Mircoalbumin    HPI:  Pt is a 67 y.o. male seen today for medical management of chronic diseases.     Past Medical History:  Diagnosis Date  . AICD (automatic cardioverter/defibrillator) present   . Alcoholism in remission ([place])   . Anemia   . Aortic insufficiency   . Arthritis   . At moderate risk for fall   . Bell's palsy   . Cardiomyopathy ([place])   . Chronic systolic heart failure (HCC)    NYHA Class III  . CKD (chronic kidney disease) stage 3, GFR  30-59 ml/min (HCC)   . COPD (chronic obstructive pulmonary disease) ([place])   . Essential hypertension, benign   . HIV disease ([place]) 09/06/2016  . Hyperlipidemia   . Noncompliance with medication regimen   . Nonischemic cardiomyopathy (HCC)    EF 15-20%  . NSVT (nonsustained ventricular tachycardia) ([place])   . Numbness of right jaw    Had a stoke in 01/2013. Numbness is occasional, especially when trying to chew.  . Productive cough 08/2013   With brown sputum   . SOB (shortness of breath) on exertion 08/2013  . Stroke ([place]) 01/2013   weakness of right side from CVA  . Type 2 diabetes mellitus ([place])   . Uses hearing aid 2014   recently received new hearing aids   Past Surgical History:  Procedure Laterality Date  . BIOPSY N/A 04/10/2014   Procedure: GASTRIC BIOPSY;  Surgeon: Daneil Dolin, MD;  Location: [facility] ORS;  Service: Endoscopy;  Laterality: N/A;  . BIOPSY  10/12/2015   Procedure: BIOPSY;  Surgeon: Daneil Dolin, MD;  Location: [facility] ENDO SUITE;  Service: Endoscopy;;  stomach bx's  . CARDIAC DEFIBRILLATOR PLACEMENT  11/12/12   [employer] Inogen MINI ICD implanted in [place] at [place] [facility] per Dr Nelly Laurence note  . COLONOSCOPY WITH PROPOFOL N/A 04/10/2014   RMR: Colonic Diverticulosis  . ESOPHAGOGASTRODUODENOSCOPY (EGD) WITH PROPOFOL N/A 04/10/2014   RMR: Mild erosive reflux esophagitis. Multiple  antral polyps likely hyperplastic status post removal by hot snare cautery technique. Diffusely abnormal stomach status post gastric biopsy I suspect some of patients anemaia may be due to intermittent oozing from the stomach. It would be difficult and a risky proposition to attempt complete removal of all of his gastric polyps.  . ESOPHAGOGASTRODUODENOSCOPY (EGD) WITH PROPOFOL N/A 10/12/2015   Procedure: ESOPHAGOGASTRODUODENOSCOPY (EGD) WITH PROPOFOL;  Surgeon: Daneil Dolin, MD;  [facility] ENDO SU[facility];  Service: Endoscopy;  Laterality:  N/A;  4098 - moved to 9:15  . POLYPECTOMY N/A 04/10/2014   Procedure: GASTRIC POLYPECTOMY;  Surgeon: Daneil Dolin, MD;  Location: [facility] ORS;  Service: Endoscopy;  Laterality: N/A;  . RIGHT HEART CATHETERIZATION N/A 02/06/2014   Procedure: RIGHT HEART CATH;  Surgeon: Larey Dresser, MD;  Location: [facility] CATH LAB;  Service: Cardiovascular;  Laterality: N/A;    No Known Allergies  Outpatient Encounter Medications as of 05/17/2018  Medication Sig  . acetaminophen (TYLENOL) 325 MG tablet Take 2 tablets (650 mg total) by mouth every 4 (four) hours as needed for mild pain (or temp > 37.5 C (99.5 F)).  Marland Kitchen aspirin 325 MG tablet Take 1 tablet (325 mg total) by mouth daily.  Marland Kitchen atorvastatin (LIPITOR) 80 MG tablet Take 80 mg by mouth daily.   . bictegravir-emtricitabine-tenofovir AF (BIKTARVY) 50-200-25 MG TABS tablet Take 1 tablet by mouth daily.  . carvedilol (COREG) 3.125 MG tablet Take 1 tablet (3.125 mg total) by mouth 2 (two) times daily with a meal.  . Cholecalciferol (VITAMIN D3) 2000 UNITS TABS Take 2,000 mg by mouth daily.  . coal tar (NEUTROGENA T-GEL) 0.5 % shampoo Apply to scalp on shower days (Twice a week) for dandruff and dry scalp Wed., and Sat.  . digoxin (LANOXIN) 0.125 MG tablet TAKE 1/2 TABLET BY MOUTH DAILY  . docusate sodium (COLACE) 100 MG capsule Take 100 mg by mouth daily.  . isosorbide mononitrate (IMDUR) 30 MG 24 hr tablet Take 30 mg by mouth daily.   . Multiple Vitamin (MULTIVITAMIN WITH MINERALS) TABS tablet Take 1 tablet by mouth daily.  . ranitidine (ZANTAC) 150 MG tablet Take 150 mg by mouth at bedtime.  . senna-docusate (SENEXON-S) 8.6-50 MG tablet Take 1 tablet by mouth daily.  . sitaGLIPtin (JANUVIA) 50 MG tablet Take 50 mg by mouth daily.  . tamsulosin (FLOMAX) 0.4 MG CAPS capsule Take 0.4 mg by mouth daily.   No facility-administered encounter medications on file as of 05/17/2018.      Review of Systems  Immunization History  Administered Date(s) Administered  .  Hepatitis A, Adult 09/19/2016, 04/18/2018  . Hepatitis B, adult 09/19/2016  . Influenza,inj,Quad PF,6-35 Mos 02/16/2018  . Influenza-Unspecified 03/13/2015, 07/07/2016, 03/03/2017  . Pneumococcal Conjugate-13 03/03/2017  . Pneumococcal Polysaccharide-23 07/08/2016, 09/19/2016  . Tdap 02/23/2017   Pertinent  Health Maintenance Due  Topic Date Due  . URINE MICROALBUMIN  06/17/2018 (Originally 02/10/1961)  . HEMOGLOBIN A1C  07/15/2018  . FOOT EXAM  03/27/2019  . OPHTHALMOLOGY EXAM  2019/05/03  . PNA vac Low Risk Adult (2 of 2 - PPSV23) 09/19/2021  . COLONOSCOPY  04/10/2024   Fall Risk  04/18/2018 03/05/2018 04/18/2017 02/23/2017 11/29/2016  Falls in the past year? 0 No No No No   Functional Status Survey:    Vitals:   05/17/18 1417  BP: 109/71  Pulse: 72  Resp: 16  Temp: (!) 97.5 F (36.4 C)  TempSrc: Oral  SpO2: 94%  Weight: 162 lb 6.4 oz (73.7 kg)  Height: 5\' 3"  (1.6 m)   Body mass index is 28.77 kg/m. Physical Exam  Labs reviewed: Recent Labs    01/02/18 0300 01/19/18 1316 03/23/18 0710 04/17/18 0733 04/17/18 0735  NA 137 135 136  --  140  K 4.4 4.9 4.5  --  4.6  CL 106 105 105  --  110  CO2 25 23 25   --  23  GLUCOSE 139* 153* 137*  --  121*  BUN 49* 42* 40*  --  36*  CREATININE 2.88* 2.68* 2.54*  --  2.56*  CALCIUM 9.0 9.1 9.3  --  9.0  PHOS 3.5  --   --  3.5 3.4   Recent Labs    07/26/17 1032 10/25/17 1309  01/19/18 1316 04/17/18 0733 04/17/18 0735  AST 28 29  --  25  --   --   ALT 35 46  --  29  --   --   ALKPHOS 102 92  --  93  --   --   BILITOT 0.5 0.7  --  0.8  --   --   PROT 8.0 8.1  --  7.5  --   --   ALBUMIN 3.5 3.4*   < > 3.4* 3.2* 3.1*   < > = values in this interval not displayed.   Recent Labs    10/25/17 1309  01/19/18 1316 03/23/18 0710 04/17/18 0733  WBC 4.0  --  3.4* 4.0 3.3*  NEUTROABS 2.3  --  2.1 2.4  --   HGB 13.2   < > 14.1 14.5 13.4  HCT 40.9   < > 43.3 46.2 43.9  MCV 90.9  --  91.0 91.7 91.8  PLT 153  --  109* 166  112*   < > = values in this interval not displayed.   Lab Results  Component Value Date   TSH 3.661 02/15/2017   Lab Results  Component Value Date   HGBA1C 7.2 (H) 01/12/2018   Lab Results  Component Value Date   CHOL 73 09/25/2017   HDL 26 (L) 09/25/2017   LDLCALC 27 09/25/2017   TRIG 101 09/25/2017   CHOLHDL 2.8 09/25/2017    Significant Diagnostic Results in last 30 days:  No results found.  Assessment/Plan There are no diagnoses linked to this encounter.   Family/ staff Communication:   Labs/tests ordered:      This encounter was created in error - please disregard.

## 2018-05-21 ENCOUNTER — Encounter: Payer: Self-pay | Admitting: Internal Medicine

## 2018-05-21 ENCOUNTER — Non-Acute Institutional Stay (SKILLED_NURSING_FACILITY): Payer: Medicare HMO | Admitting: Internal Medicine

## 2018-05-21 DIAGNOSIS — E119 Type 2 diabetes mellitus without complications: Secondary | ICD-10-CM

## 2018-05-21 DIAGNOSIS — Z95810 Presence of automatic (implantable) cardiac defibrillator: Secondary | ICD-10-CM

## 2018-05-21 DIAGNOSIS — N183 Chronic kidney disease, stage 3 unspecified: Secondary | ICD-10-CM

## 2018-05-21 DIAGNOSIS — B20 Human immunodeficiency virus [HIV] disease: Secondary | ICD-10-CM

## 2018-05-21 DIAGNOSIS — I5042 Chronic combined systolic (congestive) and diastolic (congestive) heart failure: Secondary | ICD-10-CM | POA: Diagnosis not present

## 2018-05-21 DIAGNOSIS — I639 Cerebral infarction, unspecified: Secondary | ICD-10-CM | POA: Diagnosis not present

## 2018-05-21 NOTE — Progress Notes (Signed)
Location:    [facility] Room Number: 148/D Place of Service:  SNF [phone]) Provider:  Veleta Miners MD  Virgie Dad, MD  Patient Care Team: Virgie Dad, MD as PCP - General (Internal Medicine) Evans Lance, MD as PCP - Electrophysiology (Cardiology) Gala Romney Cristopher Estimable, MD as Consulting Physician (Gastroenterology) Rolm Baptise as Physician Assistant (Internal Medicine)  Extended Emergency Contact Information Primary Emergency Contact: [facility] Address: [address] [place] [address] [place] Phone: [phone] Relation: Relative Secondary Emergency Contact: [facility] Address: [address] [place] [address] [place] Phone: [phone] Relation: Sister  Code Status:  DNR Goals of care: Advanced Directive information Advanced Directives 05/21/2018  Does Patient Have a Medical Advance Directive? Yes  Type of Advance Directive Out of facility DNR (pink MOST or yellow form)  Does patient want to make changes to medical advance directive? No - Patient declined  Copy of [place] in Chart? No - copy requested  Would patient like information on creating a medical advance directive? -  Pre-existing out of facility DNR order (yellow form or pink MOST form) -     Chief Complaint  Patient presents with  . Medical Management of Chronic Issues    Routine visit of medical management, Due Microalbumin    HPI:  Pt is a 67 y.o. male seen today for medical management of chronic diseases.    He has h/o Chronic CHF with Cardiomyopathy EF of 30% in 07/18, CKD Stage 3, HIV on treatment with Infectious disease. S/P AICD, hypertension, S/P CVA, Chronic Anemia, Hyperlipidemia, Diabetes Mellitus, COPD  Patient is Long term Resident of the Facility. He is doing well. His left Shoulder Pain is resolved since he got Steroid Injection by Dr Aline Brochure. He  walks with the Walker With Assist otherwise stays in the Wheel chair. He did not have any complains. No New Nursing issues.  Past Medical History:  Diagnosis Date  . AICD (automatic cardioverter/defibrillator) present   . Alcoholism in remission ([place])   . Anemia   . Aortic insufficiency   . Arthritis   . At moderate risk for fall   . Bell's palsy   . Cardiomyopathy ([place])   . Chronic systolic heart failure (HCC)    NYHA Class III  . CKD (chronic kidney disease) stage 3, GFR 30-59 ml/min (HCC)   . COPD (chronic obstructive pulmonary disease) ([place])   . Essential hypertension, benign   . HIV disease ([place]) 09/06/2016  . Hyperlipidemia   . Noncompliance with medication regimen   . Nonischemic cardiomyopathy (HCC)    EF 15-20%  . NSVT (nonsustained ventricular tachycardia) ([place])   . Numbness of right jaw    Had a stoke in 01/2013. Numbness is occasional, especially when trying to chew.  . Productive cough 08/2013   With brown sputum   . SOB (shortness of breath) on exertion 08/2013  . Stroke ([place]) 01/2013   weakness of right side from CVA  . Type 2 diabetes mellitus ([place])   . Uses hearing aid 2014   recently received new hearing aids   Past Surgical History:  Procedure Laterality Date  . BIOPSY N/A 04/10/2014   Procedure: GASTRIC BIOPSY;  Surgeon: Daneil Dolin, MD;  Location: [facility] ORS;  Service: Endoscopy;  Laterality: N/A;  . BIOPSY  10/12/2015  Procedure: BIOPSY;  Surgeon: Daneil Dolin, MD;  Location: [facility] ENDO SU[facility];  Service: Endoscopy;;  stomach bx's  . CARDIAC DEFIBRILLATOR PLACEMENT  11/12/12   [employer] Inogen MINI ICD implanted in [place] at [place] [facility] per Dr Nelly Laurence note  . COLONOSCOPY WITH PROPOFOL N/A 04/10/2014   RMR: Colonic Diverticulosis  . ESOPHAGOGASTRODUODENOSCOPY (EGD) WITH PROPOFOL N/A 04/10/2014   RMR: Mild erosive reflux esophagitis. Multiple antral polyps likely hyperplastic status post removal  by hot snare cautery technique. Diffusely abnormal stomach status post gastric biopsy I suspect some of patients anemaia may be due to intermittent oozing from the stomach. It would be difficult and a risky proposition to attempt complete removal of all of his gastric polyps.  . ESOPHAGOGASTRODUODENOSCOPY (EGD) WITH PROPOFOL N/A 10/12/2015   Procedure: ESOPHAGOGASTRODUODENOSCOPY (EGD) WITH PROPOFOL;  Surgeon: Daneil Dolin, MD;  [facility];  Service: Endoscopy;  Laterality: N/A;  1610 - moved to 9:15  . POLYPECTOMY N/A 04/10/2014   Procedure: GASTRIC POLYPECTOMY;  Surgeon: Daneil Dolin, MD;  Location: [facility] ORS;  Service: Endoscopy;  Laterality: N/A;  . RIGHT HEART CATHETERIZATION N/A 02/06/2014   Procedure: RIGHT HEART CATH;  Surgeon: Larey Dresser, MD;  Location: [facility] CATH LAB;  Service: Cardiovascular;  Laterality: N/A;    No Known Allergies  Outpatient Encounter Medications as of 05/21/2018  Medication Sig  . acetaminophen (TYLENOL) 325 MG tablet Take 2 tablets (650 mg total) by mouth every 4 (four) hours as needed for mild pain (or temp > 37.5 C (99.5 F)).  Marland Kitchen aspirin 325 MG tablet Take 1 tablet (325 mg total) by mouth daily.  Marland Kitchen atorvastatin (LIPITOR) 80 MG tablet Take 80 mg by mouth daily.   . bictegravir-emtricitabine-tenofovir AF (BIKTARVY) 50-200-25 MG TABS tablet Take 1 tablet by mouth daily.  . carvedilol (COREG) 3.125 MG tablet Take 1 tablet (3.125 mg total) by mouth 2 (two) times daily with a meal.  . Cholecalciferol (VITAMIN D3) 2000 UNITS TABS Take 2,000 mg by mouth daily.  . coal tar (NEUTROGENA T-GEL) 0.5 % shampoo Apply to scalp on shower days (Twice a week) for dandruff and dry scalp Wed., and Sat.  . digoxin (LANOXIN) 0.125 MG tablet TAKE 1/2 TABLET BY MOUTH DAILY  . docusate sodium (COLACE) 100 MG capsule Take 100 mg by mouth daily.  . isosorbide mononitrate (IMDUR) 30 MG 24 hr tablet Take 30 mg by mouth daily.   . Multiple Vitamin (MULTIVITAMIN WITH MINERALS)  TABS tablet Take 1 tablet by mouth daily.  . ranitidine (ZANTAC) 150 MG tablet Take 150 mg by mouth at bedtime.  . senna-docusate (SENEXON-S) 8.6-50 MG tablet Take 1 tablet by mouth daily.  . sitaGLIPtin (JANUVIA) 50 MG tablet Take 50 mg by mouth daily.  . tamsulosin (FLOMAX) 0.4 MG CAPS capsule Take 0.4 mg by mouth daily.   No facility-administered encounter medications on file as of 05/21/2018.      Review of Systems  Immunization History  Administered Date(s) Administered  . Hepatitis A, Adult 09/19/2016, 04/18/2018  . Hepatitis B, adult 09/19/2016  . Influenza,inj,Quad PF,6-35 Mos 02/16/2018  . Influenza-Unspecified 03/13/2015, 07/07/2016, 03/03/2017  . Pneumococcal Conjugate-13 03/03/2017  . Pneumococcal Polysaccharide-23 07/08/2016, 09/19/2016  . Tdap 02/23/2017   Pertinent  Health Maintenance Due  Topic Date Due  . URINE MICROALBUMIN  06/17/2018 (Originally 02/10/1961)  . HEMOGLOBIN A1C  07/15/2018  . FOOT EXAM  03/27/2019  . OPHTHALMOLOGY EXAM  04/05/2019  .  PNA vac Low Risk Adult (2 of 2 - PPSV23) 09/19/2021  . COLONOSCOPY  04/10/2024   Fall Risk  04/18/2018 03/05/2018 04/18/2017 02/23/2017 11/29/2016  Falls in the past year? 0 No No No No   Functional Status Survey:    Vitals:   05/21/18 0837  BP: 109/71  Pulse: 72  Resp: 16  Temp: (!) 97.5 F (36.4 C)  TempSrc: Oral  SpO2: 94%  Weight: 162 lb 6.4 oz (73.7 kg)  Height: 5\' 3"  (1.6 m)   Body mass index is 28.77 kg/m. Physical Exam  Labs reviewed: Recent Labs    01/02/18 0300 01/19/18 1316 03/23/18 0710 04/17/18 0733 04/17/18 0735  NA 137 135 136  --  140  K 4.4 4.9 4.5  --  4.6  CL 106 105 105  --  110  CO2 25 23 25   --  23  GLUCOSE 139* 153* 137*  --  121*  BUN 49* 42* 40*  --  36*  CREATININE 2.88* 2.68* 2.54*  --  2.56*  CALCIUM 9.0 9.1 9.3  --  9.0  PHOS 3.5  --   --  3.5 3.4   Recent Labs    07/26/17 1032 10/25/17 1309  01/19/18 1316 04/17/18 0733 04/17/18 0735  AST 28 29  --  25   --   --   ALT 35 46  --  29  --   --   ALKPHOS 102 92  --  93  --   --   BILITOT 0.5 0.7  --  0.8  --   --   PROT 8.0 8.1  --  7.5  --   --   ALBUMIN 3.5 3.4*   < > 3.4* 3.2* 3.1*   < > = values in this interval not displayed.   Recent Labs    10/25/17 1309  01/19/18 1316 03/23/18 0710 04/17/18 0733  WBC 4.0  --  3.4* 4.0 3.3*  NEUTROABS 2.3  --  2.1 2.4  --   HGB 13.2   < > 14.1 14.5 13.4  HCT 40.9   < > 43.3 46.2 43.9  MCV 90.9  --  91.0 91.7 91.8  PLT 153  --  109* 166 112*   < > = values in this interval not displayed.   Lab Results  Component Value Date   TSH 3.661 02/15/2017   Lab Results  Component Value Date   HGBA1C 7.2 (H) 01/12/2018   Lab Results  Component Value Date   CHOL 73 09/25/2017   HDL 26 (L) 09/25/2017   LDLCALC 27 09/25/2017   TRIG 101 09/25/2017   CHOLHDL 2.8 09/25/2017    Significant Diagnostic Results in last 30 days:  No results found.  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 Per their Notes his Viral Load is suppressed Mepron Discontinued On Biktarvy History of stroke Doing well with supportive care. 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  Follows with Hematology Hgb Near Normal  Chronic renal Disease  Follows  with Nephrology.   Hyperlipidemia On Lipitor LDL 42 in 04/18 Urinary Retention On Flomax with no  issues      Family/ staff Communication:   Labs/tests ordered:

## 2018-05-22 ENCOUNTER — Encounter (HOSPITAL_COMMUNITY)
Admission: RE | Admit: 2018-05-22 | Discharge: 2018-05-22 | Disposition: A | Discharge status: Home / Self Care | Payer: Medicare HMO | Source: Skilled Nursing Facility | Attending: *Deleted | Admitting: *Deleted

## 2018-05-22 DIAGNOSIS — I130 Hypertensive heart and chronic kidney disease with heart failure and stage 1 through stage 4 chronic kidney disease, or unspecified chronic kidney disease: Secondary | ICD-10-CM | POA: Diagnosis not present

## 2018-05-22 LAB — LIPID PANEL
CHOL/HDL RATIO: 4.3 ratio
CHOLESTEROL: 100 mg/dL (ref 0–200)
HDL: 23 mg/dL — AB (ref >40)
LDL Cholesterol: 43 mg/dL (ref 0–99)
TRIGLYCERIDES: 170 mg/dL — AB (ref <150)
VLDL: 34 mg/dL (ref 0–40)

## 2018-05-23 LAB — HEMOGLOBIN A1C
HEMOGLOBIN A1C: 6.5 % — AB (ref 4.8–5.6)
Mean Plasma Glucose: 140 mg/dL

## 2018-06-06 LAB — DIGOXIN LEVEL

## 2018-06-13 ENCOUNTER — Ambulatory Visit (INDEPENDENT_AMBULATORY_CARE_PROVIDER_SITE_OTHER): Payer: Medicare HMO | Admitting: Internal Medicine

## 2018-06-13 ENCOUNTER — Encounter: Payer: Self-pay | Admitting: Internal Medicine

## 2018-06-13 VITALS — BP 138/86 | HR 74 | Ht 63.0 in | Wt 161.8 lb

## 2018-06-13 DIAGNOSIS — I10 Essential (primary) hypertension: Secondary | ICD-10-CM

## 2018-06-13 DIAGNOSIS — I5042 Chronic combined systolic (congestive) and diastolic (congestive) heart failure: Secondary | ICD-10-CM | POA: Diagnosis not present

## 2018-06-13 LAB — CUP PACEART INCLINIC DEVICE CHECK
HIGH POWER IMPEDANCE MEASURED VALUE: 43 Ohm
HighPow Impedance: 77 Ohm
Implantable Lead Location: 753860
Implantable Lead Model: 180
Implantable Pulse Generator Implant Date: 20140617
Lead Channel Impedance Value: 470 Ohm
Lead Channel Pacing Threshold Pulse Width: 0.4 ms
Lead Channel Sensing Intrinsic Amplitude: 14.4 mV
MDC IDC LEAD IMPLANT DT: 20140617
MDC IDC LEAD SERIAL: 310936
MDC IDC MSMT LEADCHNL RV PACING THRESHOLD AMPLITUDE: 1.3 V
MDC IDC PG SERIAL: 436636
MDC IDC SESS DTM: 20200115050000
MDC IDC SET LEADCHNL RV PACING AMPLITUDE: 2.5 V
MDC IDC SET LEADCHNL RV PACING PULSEWIDTH: 0.4 ms
MDC IDC SET LEADCHNL RV SENSING SENSITIVITY: 0.6 mV

## 2018-06-13 NOTE — Patient Instructions (Signed)
Medication Instructions:  Your physician recommends that you continue on your current medications as directed. Please refer to the Current Medication list given to you today.  If you need a refill on your cardiac medications before your next appointment, please call your pharmacy.   Lab work: None today If you have labs (blood work) drawn today and your tests are completely normal, you will receive your results only by: Marland Kitchen MyChart Message (if you have MyChart) OR . A paper copy in the mail If you have any lab test that is abnormal or we need to change your treatment, we will call you to review the results.  Testing/Procedures: None today  Follow-Up: At [facility], you and your health needs are our priority.  As part of our continuing mission to provide you with exceptional heart care, we have created designated Provider Care Teams.  These Care Teams include your primary Cardiologist (physician) and Advanced Practice Providers (APPs -  Physician Assistants and Nurse Practitioners) who all work together to provide you with the care you need, when you need it. You will need a follow up appointment in 1 years.  Please call our office 2 months in advance to schedule this appointment.  You may see Dr.Taylor or one of the following Advanced Practice Providers on your designated Care Team:   [place], PA-C [facility]) . Ermalinda Barrios, PA-C ([place])  Any Other Special Instructions Will Be Listed Below (If Applicable). None

## 2018-06-13 NOTE — Progress Notes (Signed)
HPI Mr. Bender returns today for followup. He is a pleasant 68 yo man with chronic systolic heart failure, non-ischemic CM, EF15%, chronic renal insufficiency and multiple myeloma. He denies chest pain or sob. No ICD therapies. No CHF admits. No edema. Since we saw her last he has gained about 30 lbs. He is living in the [facility] center No Known Allergies   Current Outpatient Medications  Medication Sig Dispense Refill  . bictegravir-emtricitabine-tenofovir AF (BIKTARVY) 50-200-25 MG TABS tablet Take 1 tablet by mouth daily. 30 tablet 11   No current facility-administered medications for this visit.      Past Medical History:  Diagnosis Date  . AICD (automatic cardioverter/defibrillator) present   . Alcoholism in remission ([place])   . Anemia   . Aortic insufficiency   . Arthritis   . At moderate risk for fall   . Bell's palsy   . Cardiomyopathy ([place])   . Chronic systolic heart failure (HCC)    NYHA Class III  . CKD (chronic kidney disease) stage 3, GFR 30-59 ml/min (HCC)   . COPD (chronic obstructive pulmonary disease) ([place])   . Essential hypertension, benign   . HIV disease ([place]) 09/06/2016  . Hyperlipidemia   . Noncompliance with medication regimen   . Nonischemic cardiomyopathy (HCC)    EF 15-20%  . NSVT (nonsustained ventricular tachycardia) ([place])   . Numbness of right jaw    Had a stoke in 01/2013. Numbness is occasional, especially when trying to chew.  . Productive cough 08/2013   With brown sputum   . SOB (shortness of breath) on exertion 08/2013  . Stroke ([place]) 01/2013   weakness of right side from CVA  . Type 2 diabetes mellitus ([place])   . Uses hearing aid 2014   recently received new hearing aids    ROS:   All systems reviewed and negative except as noted in the HPI.   Past Surgical History:  Procedure Laterality Date  . BIOPSY N/A 04/10/2014   Procedure: GASTRIC BIOPSY;  Surgeon: Daneil Dolin, MD;  Location: [facility] OR[facility];  Service: Endoscopy;  Laterality:  N/A;  . BIOPSY  10/12/2015   Procedure: BIOPSY;  Surgeon: Daneil Dolin, MD;  Location: [facility] END[facility] SU[facility];  Service: Endoscopy;;  stomach bx's  . CARDIAC DEFIBRILLATOR PLACEMENT  11/12/12   [employer] Inogen MINI ICD implanted in [place] at [place] AVR per Dr Nelly Laurence note  . COLONOSCOPY WITH PROPOFOL N/A 04/10/2014   RMR: Colonic Diverticulosis  . ESOPHAGOGASTRODUODENOSCOPY (EGD) WITH PROPOFOL N/A 04/10/2014   RMR: Mild erosive reflux esophagitis. Multiple antral polyps likely hyperplastic status post removal by hot snare cautery technique. Diffusely abnormal stomach status post gastric biopsy I suspect some of patients anemaia may be due to intermittent oozing from the stomach. It would be difficult and a risky proposition to attempt complete removal of all of his gastric polyps.  . ESOPHAGOGASTRODUODENOSCOPY (EGD) WITH PROPOFOL N/A 10/12/2015   Procedure: ESOPHAGOGASTRODUODENOSCOPY (EGD) WITH PROPOFOL;  Surgeon: Daneil Dolin, MD;  Location: [facility];  Service: Endoscopy;  Laterality: N/A;  [id number] - moved to 9:15  . POLYPECTOMY N/A 04/10/2014   Procedure: GASTRIC POLYPECTOMY;  Surgeon: Daneil Dolin, MD;  Location: [facility] ORS;  Service: Endoscopy;  Laterality: N/A;  . RIGHT HEART CATHETERIZATION N/A 02/06/2014   Procedure: RIGHT HEART CATH;  Surgeon: Larey Dresser, MD;  Location: [facility] CATH LAB;  Service: Cardiovascular;  Laterality: N/A;  Family History  Problem Relation Age of Onset  . Kidney disease Mother   . Kidney disease Sister   . Colon cancer Neg Hx      Social History   Socioeconomic History  . Marital status: Single    Spouse name: Not on file  . Number of children: Not on file  . Years of education: Not on file  . Highest education level: Not on file  Occupational History  . Occupation: disabled  Social Needs  . Financial resource strain: Not hard at all  . Food insecurity:    Worry: Never true     Inability: Never true  . Transportation needs:    Medical: No    Non-medical: No  Tobacco Use  . Smoking status: Former Smoker    Types: Cigarettes    Last attempt to quit: 11/05/1993    Years since quitting: 24.6  . Smokeless tobacco: Never Used  Substance and Sexual Activity  . Alcohol use: Not Currently    Comment: former heavy ETOH, none recently  . Drug use: No    Comment: prior history of cocaine use, smoking   . Sexual activity: Yes    Birth control/protection: None  Lifestyle  . Physical activity:    Days per week: 4 days    Minutes per session: 20 min  . Stress: Not at all  Relationships  . Social connections:    Talks on phone: Never    Gets together: Once a week    Attends religious service: Never    Active member of club or organization: No    Attends meetings of clubs or organizations: Never    Relationship status: Never married  . Intimate partner violence:    Fear of current or ex partner: No    Emotionally abused: No    Physically abused: No    Forced sexual activity: No  Other Topics Concern  . Not on file  Social History Narrative   Recently moved from [name] to be with family in [place] (2015).  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  Has h/o polysubstance abuse.  Baseline - able to ambulate, mild cognitive delay, able to toilet and shower himself, occasional use of walker/cane.   2019- resident of [facility]. Unable to ambulate unassisted.              BP 138/86   Pulse 74   Ht _0  (1.6 m)   Wt 161 lb 12.8 oz (73.4 kg)   SpO2 98%   BMI 28.66 kg/m   Physical Exam:  Well appearing NAD HEENT: Unremarkable Neck:  No JVD, no thyromegally Lymphatics:  No adenopathy Back:  No CVA tenderness Lungs:  Clear with no wheezes HEART:  Regular rate rhythm, no murmurs, no rubs, no clicks Abd:  soft, positive bowel sounds, no organomegally, no rebound, no guarding Ext:  2 plus pulses, no edema, no cyanosis, no  clubbing Skin:  No rashes no nodules Neuro:  CN II through XII intact, motor grossly intact   DEVICE  Normal device function.  See PaceArt for details.   Assess/Plan: 1. Chronic systolic heart failure - his symptoms are class 2. He will continue his current meds. 2. ICD - his boston sci single chamber ICD is working normally. We will recheck in several months.  Mikle [place]worth.D.

## 2018-06-22 LAB — CUP PACEART REMOTE DEVICE CHECK
Date Time Interrogation Session: 20200124200732
Implantable Lead Implant Date: 20140617
Implantable Lead Location: 753860
Implantable Pulse Generator Implant Date: 20140617
MDC IDC LEAD SERIAL: 310936
MDC IDC PG SERIAL: 436636

## 2018-07-03 ENCOUNTER — Non-Acute Institutional Stay (SKILLED_NURSING_FACILITY): Payer: Medicare HMO | Admitting: Internal Medicine

## 2018-07-03 ENCOUNTER — Encounter: Payer: Self-pay | Admitting: Internal Medicine

## 2018-07-03 DIAGNOSIS — K2100 Gastro-esophageal reflux disease with esophagitis, without bleeding: Secondary | ICD-10-CM

## 2018-07-03 DIAGNOSIS — N183 Chronic kidney disease, stage 3 unspecified: Secondary | ICD-10-CM

## 2018-07-03 DIAGNOSIS — B20 Human immunodeficiency virus [HIV] disease: Secondary | ICD-10-CM

## 2018-07-03 DIAGNOSIS — N189 Chronic kidney disease, unspecified: Secondary | ICD-10-CM

## 2018-07-03 DIAGNOSIS — I5042 Chronic combined systolic (congestive) and diastolic (congestive) heart failure: Secondary | ICD-10-CM

## 2018-07-03 DIAGNOSIS — E119 Type 2 diabetes mellitus without complications: Secondary | ICD-10-CM | POA: Diagnosis not present

## 2018-07-03 DIAGNOSIS — D631 Anemia in chronic kidney disease: Secondary | ICD-10-CM

## 2018-07-03 DIAGNOSIS — Z95810 Presence of automatic (implantable) cardiac defibrillator: Secondary | ICD-10-CM

## 2018-07-03 NOTE — Progress Notes (Signed)
Location:    [facility] Room Number: 148/D Place of Service:  SNF [phone]) Provider:  Granville Lewis PA-C  Virgie Dad, MD  Patient Care Team: Virgie Dad, MD as PCP - General (Internal Medicine) Evans Lance, MD as PCP - Electrophysiology (Cardiology) Gala Romney Cristopher Estimable, MD as Consulting Physician (Gastroenterology) Rolm Baptise as Physician Assistant (Internal Medicine)  Extended Emergency Contact Information Primary Emergency Contact: [facility] Address: [address] [place] [address] [place] Phone: [phone] Relation: Relative Secondary Emergency Contact: [facility] Address: [address] [place] [address] [place] Phone: [phone] Relation: Sister  Code Status:  DNR Goals of care: Advanced Directive information Advanced Directives 07/03/2018  Does Patient Have a Medical Advance Directive? Yes  Type of Advance Directive Out of facility DNR (pink MOST or yellow form)  Does patient want to make changes to medical advance directive? No - Patient declined  Copy of [place] in Chart? No - copy requested  Would patient like information on creating a medical advance directive? -  Pre-existing out of facility DNR order (yellow form or pink MOST form) -     Chief Complaint  Patient presents with  . Medical Management of Chronic Issues    Routine visit of medical management, Mircoalbumin  Medical management of chronic medical conditions including systolic CHF ejection fraction 30%-chronic kidney disease followed by nephrology as well as history of AICD placement followed by cardiology as well as history of HIV-type 2 diabetes COPD hypertension and history of CVA  HPI:  Pt is a 68 y.o. male seen today for medical management of chronic diseases as noted above.  .  Patient does have a history of congestive heart failure with cardiomyopathy  ejection fraction of 30% on echo done in July 2018 as well as chronic kidney disease stage III-HIV he is being treated by infectious disease as well as status post AICD placement as well as hypertension CVA anemia hyperlipidemia type 2 diabetes and COPD.  Patient despite his numerous diagnoses actually appears to be doing quite well weight is stable at around 264 pounds he does not complain of any shortness of breath or chest pain nursing does not report any issues.  He is followed by cardiology and saw them in January and thought to be doing well with recommendation to continue Coreg digoxin and Imdur  -- Lasix was discontinued by nephrology nonetheless his weight appears to be stable he does not appear to have significantly increased edema.  He also is followed by nephrology for his chronic kidney disease last creatinine showed mild improvement at 2.58 this was done in mid November- he has follow-up with nephrology scheduled April.  He also has a history of AICD placement which is followed by cardiology he saw cardiology on January 15.  Regards to HIV he is on Biktarvy is followed by infectious disease as well is not really been an issue during his stay here.  He is on Januvia with a history of type 2 diabetes hemoglobin A1c in December was satisfactory at 6.5 his CBGs appear to be in the 80s to low 100s recently.  He also has a history of CVA he is on aspirin and a statin LDL was 43 on December lab.  In regards to anemia this is thought secondary to renal disease he  is followed by hematology hemoglobin was stable at 13.4 on November lab.  Apparently has received Aranesp in the past  Currently he is resting in bed comfortably again has no acute complaints vital signs are stable   Past Medical History:  Diagnosis Date  . AICD (automatic cardioverter/defibrillator) present   . Alcoholism in remission ([place])   . Anemia   . Aortic insufficiency   . Arthritis   . At moderate risk for fall     . Bell's palsy   . Cardiomyopathy ([place])   . Chronic systolic heart failure (HCC)    NYHA Class III  . CKD (chronic kidney disease) stage 3, GFR 30-59 ml/min (HCC)   . COPD (chronic obstructive pulmonary disease) ([place])   . Essential hypertension, benign   . HIV disease ([place]) 09/06/2016  . Hyperlipidemia   . Noncompliance with medication regimen   . Nonischemic cardiomyopathy (HCC)    EF 15-20%  . NSVT (nonsustained ventricular tachycardia) ([place])   . Numbness of right jaw    Had a stoke in 01/2013. Numbness is occasional, especially when trying to chew.  . Productive cough 08/2013   With brown sputum   . SOB (shortness of breath) on exertion 08/2013  . Stroke ([place]) 01/2013   weakness of right side from CVA  . Type 2 diabetes mellitus ([place])   . Uses hearing aid 2014   recently received new hearing aids   Past Surgical History:  Procedure Laterality Date  . BIOPSY N/A 04/10/2014   Procedure: GASTRIC BIOPSY;  Surgeon: Daneil Dolin, MD;  Location: [facility];  Service: Endoscopy;  Laterality: N/A;  . BIOPSY  10/12/2015   Procedure: BIOPSY;  Surgeon: Daneil Dolin, MD;  [facility];  Service: Endoscopy;;  stomach bx's  . CARDIAC DEFIBRILLATOR PLACEMENT  11/12/12   [employer] Inogen MINI ICD implanted in [place] at [place] AVR per Dr Nelly Laurence note  . COLONOSCOPY WITH PROPOFOL N/A 04/10/2014   RMR: Colonic Diverticulosis  . ESOPHAGOGASTRODUODENOSCOPY (EGD) WITH PROPOFOL N/A 04/10/2014   RMR: Mild erosive reflux esophagitis. Multiple antral polyps likely hyperplastic status post removal by hot snare cautery technique. Diffusely abnormal stomach status post gastric biopsy I suspect some of patients anemaia may be due to intermittent oozing from the stomach. It would be difficult and a risky proposition to attempt complete removal of all of his gastric polyps.  . ESOPHAGOGASTRODUODENOSCOPY (EGD) WITH PROPOFOL N/A 10/12/2015    Procedure: ESOPHAGOGASTRODUODENOSCOPY (EGD) WITH PROPOFOL;  Surgeon: Daneil Dolin, MD;  [facility];  Service: Endoscopy;  Laterality: N/A;  1601 - moved to 9:15  . POLYPECTOMY N/A 04/10/2014   Procedure: GASTRIC POLYPECTOMY;  Surgeon: Daneil Dolin, MD;  Location: [facility] OR[facility];  Service: Endoscopy;  Laterality: N/A;  . RIGHT HEART CATHETERIZATION N/A 02/06/2014   Procedure: RIGHT HEART CATH;  Surgeon: Larey Dresser, MD;  Location: [facility] CATH LAB;  Service: Cardiovascular;  Laterality: N/A;    No Known Allergies  Outpatient Encounter Medications as of 07/03/2018  Medication Sig  . acetaminophen (TYLENOL) 325 MG tablet Take 2 tablets (650 mg total) by mouth every 4 (four) hours as needed for mild pain (or temp > 37.5 C (99.5 F)).  Marland Kitchen aspirin 325 MG tablet Take 1 tablet (325 mg total) by mouth daily.  Marland Kitchen atorvastatin (LIPITOR) 80 MG tablet Take 80 mg by mouth daily.   . bictegravir-emtricitabine-tenofovir AF (BIKTARVY) 50-200-25 MG TABS tablet Take  1 tablet by mouth daily.  . carvedilol (COREG) 3.125 MG tablet Take 1 tablet (3.125 mg total) by mouth 2 (two) times daily with a meal.  . Cholecalciferol (VITAMIN D3) 2000 UNITS TABS Take 2,000 mg by mouth daily.  . coal tar (NEUTROGENA T-GEL) 0.5 % shampoo Apply to scalp on shower days (Twice a week) for dandruff and dry scalp Wed., and Sat.  . digoxin (LANOXIN) 0.125 MG tablet TAKE 1/2 TABLET BY MOUTH DAILY  . docusate sodium (COLACE) 100 MG capsule Take 100 mg by mouth daily.  . isosorbide mononitrate (IMDUR) 30 MG 24 hr tablet Take 30 mg by mouth daily.   . Multiple Vitamin (MULTIVITAMIN WITH MINERALS) TABS tablet Take 1 tablet by mouth daily.  . ranitidine (ZANTAC) 150 MG tablet Take 150 mg by mouth at bedtime.  . senna-docusate (SENEXON-S) 8.6-50 MG tablet Take 1 tablet by mouth daily.  . sitaGLIPtin (JANUVIA) 50 MG tablet Take 50 mg by mouth daily.  . tamsulosin (FLOMAX) 0.4 MG CAPS capsule Take 0.4 mg by mouth daily.   No  facility-administered encounter medications on file as of 07/03/2018.      Review of Systems   General is not complaining of fever chills his weight is stable says he has a good appetite.  Skin does not complain of rashes or itching.  Head ears eyes nose mouth and throat is not complain of visual changes or sore throat.  Respiratory does not complain of being short of breath or having a cough.  Cardiac does not complain of chest pain does not really have significant lower extremity edema.  GI is not complaining of abdominal pain nausea vomiting diarrhea constipation.  GU does not complain of dysuria does have a history of urinary retention is on Flomax.  Musculoskeletal does not complain of joint pain continues to ambulate about in his wheelchair.  Neurologic does not complain of dizziness headache or numbness.  And psych does not complain of overtly being depressed or anxious appears to be in good spirits this afternoon  Immunization History  Administered Date(s) Administered  . Hepatitis A, Adult 09/19/2016, 04/18/2018  . Hepatitis B, adult 09/19/2016  . Influenza,inj,Quad PF,6-35 Mos 02/16/2018  . Influenza-Unspecified 03/13/2015, 07/07/2016, 03/03/2017  . Pneumococcal Conjugate-13 03/03/2017  . Pneumococcal Polysaccharide-23 07/08/2016, 09/19/2016  . Tdap 02/23/2017   Pertinent  Health Maintenance Due  Topic Date Due  . URINE MICROALBUMIN  08/01/2018 (Originally 02/10/1961)  . HEMOGLOBIN A1C  11/21/2018  . FOOT EXAM  03/27/2019  . OPHTHALMOLOGY EXAM  04-23-2019  . PNA vac Low Risk Adult (2 of 2 - PPSV23) 09/19/2021  . COLONOSCOPY  04/10/2024   Fall Risk  04/18/2018 03/05/2018 04/18/2017 02/23/2017 11/29/2016  Falls in the past year? 0 No No No No   Functional Status Survey:    Vitals:   07/03/18 1444  BP: 132/69  Pulse: 77  Resp: 16  Temp: 98 F (36.7 C)  TempSrc: Oral  SpO2: 94%  Weight: 164 lb 3.2 oz (74.5 kg)  Height: 5\' 3"  (1.6 m)  Manual blood pressure  was 118/80 Body mass index is 29.09 kg/m. Physical Exam   In general this is a pleasant male in no distress lying comfortably in bed.  His skin is warm and dry.  Eyes visual acuity appears to be intact sclera and conjunctive are clear  Oropharynx is clear mucous membranes moist.  Chest is clear to auscultation there is no labored breathing air entry is somewhat shallow   Heart is regular rate  and rhythm without murmur gallop or rub he does not have significant lower extremity edema.  Apparently does have some history of a systolic murmur but was difficult to fully auscultate today  Abdomen is soft nontender with positive bowel sounds  Musculoskeletal moves all extremities x4 at baseline limited exam since he is in bed.  Neurologic appears grossly intact his speech cranial nerves appear to be intact does have baseline lower extremity weakness  He is oriented to self is pleasant appropriate Labs reviewed: Recent Labs    01/02/18 0300 01/19/18 1316 03/23/18 0710 04/17/18 0733 04/17/18 0735  NA 137 135 136  --  140  K 4.4 4.9 4.5  --  4.6  CL 106 105 105  --  110  CO2 25 23 25   --  23  GLUCOSE 139* 153* 137*  --  121*  BUN 49* 42* 40*  --  36*  CREATININE 2.88* 2.68* 2.54*  --  2.56*  CALCIUM 9.0 9.1 9.3  --  9.0  PHOS 3.5  --   --  3.5 3.4   Recent Labs    07/26/17 1032 10/25/17 1309  01/19/18 1316 04/17/18 0733 04/17/18 0735  AST 28 29  --  25  --   --   ALT 35 46  --  29  --   --   ALKPHOS 102 92  --  93  --   --   BILITOT 0.5 0.7  --  0.8  --   --   PROT 8.0 8.1  --  7.5  --   --   ALBUMIN 3.5 3.4*   < > 3.4* 3.2* 3.1*   < > = values in this interval not displayed.   Recent Labs    10/25/17 1309  01/19/18 1316 03/23/18 0710 04/17/18 0733  WBC 4.0  --  3.4* 4.0 3.3*  NEUTROABS 2.3  --  2.1 2.4  --   HGB 13.2   < > 14.1 14.5 13.4  HCT 40.9   < > 43.3 46.2 43.9  MCV 90.9  --  91.0 91.7 91.8  PLT 153  --  109* 166 112*   < > = values in this interval  not displayed.   Lab Results  Component Value Date   TSH 3.661 02/15/2017   Lab Results  Component Value Date   HGBA1C 6.5 (H) 05/22/2018   Lab Results  Component Value Date   CHOL 100 05/22/2018   HDL 23 (L) 05/22/2018   LDLCALC 43 05/22/2018   TRIG 170 (H) 05/22/2018   CHOLHDL 4.3 05/22/2018    Significant Diagnostic Results in last 30 days:  No results found.  Assessment/Plan  1 history of systolic CHF with ejection fraction 30%-continues on Coreg digoxin and Imdur this appears to be stable-weight is stable edema does not appear to be significant-.  Lasix was discontinued by nephrology because of his renal issues.  2.  History of chronic kidney disease he is followed by nephrology he will see them again in April last creatinine was relatively baseline slightly improved at 2.56 will update this.  3.  History of AICD placement this is been followed by cardiology as well and felt to be stable.  4.-  History of HIV this is followed by infectious disease per review of infectious disease note November 2019 he is on Biktarvy  #5 type 2 diabetes is controlled on Januvia CBGs largely 80s to lower 100s- hemoglobin A1c was 6.5 in December.  6.  History of  hypertension continues on Imdur and Coreg this appears to be stable reading earlier today and readings I got later today appear to be stable.at 118/80  7.  History of CVA continues on aspirin and a statin LDL was 43 December.  8.  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   #10 History of left shoulder pain he has not complained of this in some time to my knowledge he did receive a steroid injection at one point with good effect.  11 history of COPD this has been largely asymptomatic  12 history of vitamin D deficiency this has normalized  at 38 on lab done in November 2019.  Again will update a CBC and BMP to keep an eye on his hemoglobin as well as renal function he is followed by hematology as well as nephrology.  [id number] note  greater than 35 minutes spent assessing patient-reviewing his chart and labs- reviewing his consult notes- coordinating and formulating a plan of care for numerous diagnoses- of note greater than 50% of time spent coordinating a plan of care with input as noted above

## 2018-07-04 ENCOUNTER — Encounter (HOSPITAL_COMMUNITY)
Admission: RE | Admit: 2018-07-04 | Discharge: 2018-07-04 | Disposition: A | Discharge status: Home / Self Care | Payer: Medicare HMO | Source: Skilled Nursing Facility | Attending: *Deleted | Admitting: *Deleted

## 2018-07-04 DIAGNOSIS — A4151 Sepsis due to Escherichia coli [E. coli]: Secondary | ICD-10-CM | POA: Diagnosis not present

## 2018-07-04 DIAGNOSIS — R4182 Altered mental status, unspecified: Secondary | ICD-10-CM | POA: Diagnosis not present

## 2018-07-04 LAB — CBC WITH DIFFERENTIAL/PLATELET
ABS IMMATURE GRANULOCYTES: 0.01 10*3/uL (ref 0.00–0.07)
BASOS PCT: 1 %
Basophils Absolute: 0 10*3/uL (ref 0.0–0.1)
Eosinophils Absolute: 0.2 10*3/uL (ref 0.0–0.5)
Eosinophils Relative: 7 %
HCT: 45.6 % (ref 39.0–52.0)
HEMOGLOBIN: 14 g/dL (ref 13.0–17.0)
Immature Granulocytes: 0 %
LYMPHS PCT: 17 %
Lymphs Abs: 0.6 10*3/uL — ABNORMAL LOW (ref 0.7–4.0)
MCH: 28.2 pg (ref 26.0–34.0)
MCHC: 30.7 g/dL (ref 30.0–36.0)
MCV: 91.8 fL (ref 80.0–100.0)
MONO ABS: 0.6 10*3/uL (ref 0.1–1.0)
MONOS PCT: 15 %
Neutro Abs: 2.2 10*3/uL (ref 1.7–7.7)
Neutrophils Relative %: 60 %
Platelets: 124 10*3/uL — ABNORMAL LOW (ref 150–400)
RBC: 4.97 MIL/uL (ref 4.22–5.81)
RDW: 13.7 % (ref 11.5–15.5)
WBC: 3.7 10*3/uL — ABNORMAL LOW (ref 4.0–10.5)
nRBC: 0 % (ref 0.0–0.2)

## 2018-07-04 LAB — BASIC METABOLIC PANEL
Anion gap: 7 (ref 5–15)
BUN: 45 mg/dL — AB (ref 8–23)
CHLORIDE: 109 mmol/L (ref 98–111)
CO2: 23 mmol/L (ref 22–32)
Calcium: 8.9 mg/dL (ref 8.9–10.3)
Creatinine, Ser: 2.44 mg/dL — ABNORMAL HIGH (ref 0.61–1.24)
GFR calc Af Amer: 31 mL/min — ABNORMAL LOW (ref >60)
GFR calc non Af Amer: 27 mL/min — ABNORMAL LOW (ref >60)
GLUCOSE: 100 mg/dL — AB (ref 70–99)
Potassium: 4.7 mmol/L (ref 3.5–5.1)
Sodium: 139 mmol/L (ref 135–145)

## 2018-07-04 LAB — BRAIN NATRIURETIC PEPTIDE: B Natriuretic Peptide: 697 pg/mL — ABNORMAL HIGH (ref 0.0–100.0)

## 2018-07-06 ENCOUNTER — Non-Acute Institutional Stay (SKILLED_NURSING_FACILITY): Payer: Medicare HMO | Admitting: Internal Medicine

## 2018-07-06 ENCOUNTER — Encounter (HOSPITAL_COMMUNITY): Payer: Self-pay

## 2018-07-06 ENCOUNTER — Emergency Department (HOSPITAL_COMMUNITY): Payer: Medicare HMO

## 2018-07-06 ENCOUNTER — Inpatient Hospital Stay (HOSPITAL_COMMUNITY): Admission: RE | Admit: 2018-07-06 | Payer: Self-pay | Source: Skilled Nursing Facility

## 2018-07-06 ENCOUNTER — Inpatient Hospital Stay (HOSPITAL_COMMUNITY)
Admission: EM | Admit: 2018-07-06 | Discharge: 2018-07-26 | DRG: 969 | Disposition: A | Discharge status: Skilled Nursing Facility | Payer: Medicare HMO | Source: Other Acute Inpatient Hospital | Attending: Internal Medicine | Admitting: Internal Medicine

## 2018-07-06 ENCOUNTER — Encounter: Payer: Self-pay | Admitting: Internal Medicine

## 2018-07-06 DIAGNOSIS — G0490 Encephalitis and encephalomyelitis, unspecified: Secondary | ICD-10-CM

## 2018-07-06 DIAGNOSIS — E1122 Type 2 diabetes mellitus with diabetic chronic kidney disease: Secondary | ICD-10-CM | POA: Diagnosis present

## 2018-07-06 DIAGNOSIS — N183 Chronic kidney disease, stage 3 unspecified: Secondary | ICD-10-CM | POA: Diagnosis present

## 2018-07-06 DIAGNOSIS — Z953 Presence of xenogenic heart valve: Secondary | ICD-10-CM

## 2018-07-06 DIAGNOSIS — E440 Moderate protein-calorie malnutrition: Secondary | ICD-10-CM

## 2018-07-06 DIAGNOSIS — B20 Human immunodeficiency virus [HIV] disease: Secondary | ICD-10-CM | POA: Diagnosis present

## 2018-07-06 DIAGNOSIS — Z992 Dependence on renal dialysis: Secondary | ICD-10-CM

## 2018-07-06 DIAGNOSIS — I472 Ventricular tachycardia: Secondary | ICD-10-CM | POA: Diagnosis not present

## 2018-07-06 DIAGNOSIS — Z87891 Personal history of nicotine dependence: Secondary | ICD-10-CM

## 2018-07-06 DIAGNOSIS — A419 Sepsis, unspecified organism: Secondary | ICD-10-CM | POA: Diagnosis present

## 2018-07-06 DIAGNOSIS — Z7901 Long term (current) use of anticoagulants: Secondary | ICD-10-CM

## 2018-07-06 DIAGNOSIS — R4182 Altered mental status, unspecified: Secondary | ICD-10-CM

## 2018-07-06 DIAGNOSIS — E11649 Type 2 diabetes mellitus with hypoglycemia without coma: Secondary | ICD-10-CM | POA: Diagnosis present

## 2018-07-06 DIAGNOSIS — Z79899 Other long term (current) drug therapy: Secondary | ICD-10-CM

## 2018-07-06 DIAGNOSIS — E875 Hyperkalemia: Secondary | ICD-10-CM | POA: Diagnosis not present

## 2018-07-06 DIAGNOSIS — I10 Essential (primary) hypertension: Secondary | ICD-10-CM | POA: Diagnosis present

## 2018-07-06 DIAGNOSIS — D631 Anemia in chronic kidney disease: Secondary | ICD-10-CM | POA: Diagnosis present

## 2018-07-06 DIAGNOSIS — G8194 Hemiplegia, unspecified affecting left nondominant side: Secondary | ICD-10-CM | POA: Diagnosis present

## 2018-07-06 DIAGNOSIS — G9349 Other encephalopathy: Secondary | ICD-10-CM | POA: Diagnosis present

## 2018-07-06 DIAGNOSIS — I5042 Chronic combined systolic (congestive) and diastolic (congestive) heart failure: Secondary | ICD-10-CM

## 2018-07-06 DIAGNOSIS — J441 Chronic obstructive pulmonary disease with (acute) exacerbation: Secondary | ICD-10-CM | POA: Diagnosis present

## 2018-07-06 DIAGNOSIS — Z95810 Presence of automatic (implantable) cardiac defibrillator: Secondary | ICD-10-CM | POA: Diagnosis present

## 2018-07-06 DIAGNOSIS — R7881 Bacteremia: Secondary | ICD-10-CM

## 2018-07-06 DIAGNOSIS — G9340 Encephalopathy, unspecified: Secondary | ICD-10-CM

## 2018-07-06 DIAGNOSIS — I428 Other cardiomyopathies: Secondary | ICD-10-CM | POA: Diagnosis present

## 2018-07-06 DIAGNOSIS — R062 Wheezing: Secondary | ICD-10-CM

## 2018-07-06 DIAGNOSIS — N186 End stage renal disease: Secondary | ICD-10-CM | POA: Diagnosis present

## 2018-07-06 DIAGNOSIS — K5790 Diverticulosis of intestine, part unspecified, without perforation or abscess without bleeding: Secondary | ICD-10-CM

## 2018-07-06 DIAGNOSIS — E119 Type 2 diabetes mellitus without complications: Secondary | ICD-10-CM

## 2018-07-06 DIAGNOSIS — E43 Unspecified severe protein-calorie malnutrition: Secondary | ICD-10-CM | POA: Diagnosis present

## 2018-07-06 DIAGNOSIS — Z841 Family history of disorders of kidney and ureter: Secondary | ICD-10-CM

## 2018-07-06 DIAGNOSIS — I493 Ventricular premature depolarization: Secondary | ICD-10-CM | POA: Diagnosis not present

## 2018-07-06 DIAGNOSIS — R000 Tachycardia, unspecified: Secondary | ICD-10-CM

## 2018-07-06 DIAGNOSIS — R29810 Facial weakness: Secondary | ICD-10-CM | POA: Diagnosis present

## 2018-07-06 DIAGNOSIS — N390 Urinary tract infection, site not specified: Secondary | ICD-10-CM | POA: Diagnosis present

## 2018-07-06 DIAGNOSIS — E785 Hyperlipidemia, unspecified: Secondary | ICD-10-CM | POA: Diagnosis present

## 2018-07-06 DIAGNOSIS — Z950 Presence of cardiac pacemaker: Secondary | ICD-10-CM

## 2018-07-06 DIAGNOSIS — R509 Fever, unspecified: Secondary | ICD-10-CM | POA: Diagnosis not present

## 2018-07-06 DIAGNOSIS — G4733 Obstructive sleep apnea (adult) (pediatric): Secondary | ICD-10-CM | POA: Diagnosis present

## 2018-07-06 DIAGNOSIS — I132 Hypertensive heart and chronic kidney disease with heart failure and with stage 5 chronic kidney disease, or end stage renal disease: Secondary | ICD-10-CM | POA: Diagnosis present

## 2018-07-06 DIAGNOSIS — Z6826 Body mass index (BMI) 26.0-26.9, adult: Secondary | ICD-10-CM

## 2018-07-06 DIAGNOSIS — Z8673 Personal history of transient ischemic attack (TIA), and cerebral infarction without residual deficits: Secondary | ICD-10-CM

## 2018-07-06 DIAGNOSIS — Z66 Do not resuscitate: Secondary | ICD-10-CM | POA: Diagnosis present

## 2018-07-06 DIAGNOSIS — R6521 Severe sepsis with septic shock: Secondary | ICD-10-CM | POA: Diagnosis present

## 2018-07-06 DIAGNOSIS — N170 Acute kidney failure with tubular necrosis: Secondary | ICD-10-CM | POA: Diagnosis present

## 2018-07-06 DIAGNOSIS — I2510 Atherosclerotic heart disease of native coronary artery without angina pectoris: Secondary | ICD-10-CM | POA: Diagnosis present

## 2018-07-06 DIAGNOSIS — I6389 Other cerebral infarction: Secondary | ICD-10-CM | POA: Diagnosis present

## 2018-07-06 DIAGNOSIS — R0603 Acute respiratory distress: Secondary | ICD-10-CM

## 2018-07-06 DIAGNOSIS — Z419 Encounter for procedure for purposes other than remedying health state, unspecified: Secondary | ICD-10-CM

## 2018-07-06 DIAGNOSIS — B9620 Unspecified Escherichia coli [E. coli] as the cause of diseases classified elsewhere: Secondary | ICD-10-CM

## 2018-07-06 DIAGNOSIS — G9341 Metabolic encephalopathy: Secondary | ICD-10-CM | POA: Diagnosis present

## 2018-07-06 DIAGNOSIS — A4151 Sepsis due to Escherichia coli [E. coli]: Principal | ICD-10-CM | POA: Diagnosis present

## 2018-07-06 DIAGNOSIS — G253 Myoclonus: Secondary | ICD-10-CM | POA: Diagnosis not present

## 2018-07-06 DIAGNOSIS — E1165 Type 2 diabetes mellitus with hyperglycemia: Secondary | ICD-10-CM | POA: Diagnosis present

## 2018-07-06 DIAGNOSIS — Z7982 Long term (current) use of aspirin: Secondary | ICD-10-CM

## 2018-07-06 DIAGNOSIS — D6959 Other secondary thrombocytopenia: Secondary | ICD-10-CM | POA: Diagnosis present

## 2018-07-06 DIAGNOSIS — N179 Acute kidney failure, unspecified: Secondary | ICD-10-CM

## 2018-07-06 DIAGNOSIS — I480 Paroxysmal atrial fibrillation: Secondary | ICD-10-CM | POA: Diagnosis not present

## 2018-07-06 HISTORY — DX: Obstructive sleep apnea (adult) (pediatric): G47.33

## 2018-07-06 HISTORY — DX: Other cardiomyopathies: I42.8

## 2018-07-06 HISTORY — DX: Chronic kidney disease, stage 4 (severe): N18.4

## 2018-07-06 HISTORY — DX: Presence of xenogenic heart valve: Z95.3

## 2018-07-06 LAB — COMPREHENSIVE METABOLIC PANEL
ALBUMIN: 3 g/dL — AB (ref 3.5–5.0)
ALT: 46 U/L — AB (ref 0–44)
AST: 70 U/L — AB (ref 15–41)
Alkaline Phosphatase: 102 U/L (ref 38–126)
Anion gap: 11 (ref 5–15)
BUN: 43 mg/dL — AB (ref 8–23)
CO2: 20 mmol/L — ABNORMAL LOW (ref 22–32)
CREATININE: 3.01 mg/dL — AB (ref 0.61–1.24)
Calcium: 8.8 mg/dL — ABNORMAL LOW (ref 8.9–10.3)
Chloride: 105 mmol/L (ref 98–111)
GFR calc Af Amer: 24 mL/min — ABNORMAL LOW (ref >60)
GFR calc non Af Amer: 21 mL/min — ABNORMAL LOW (ref >60)
Glucose, Bld: 136 mg/dL — ABNORMAL HIGH (ref 70–99)
Potassium: 5.9 mmol/L — ABNORMAL HIGH (ref 3.5–5.1)
Sodium: 136 mmol/L (ref 135–145)
Total Bilirubin: 0.9 mg/dL (ref 0.3–1.2)
Total Protein: 7.1 g/dL (ref 6.5–8.1)

## 2018-07-06 LAB — CBC WITH DIFFERENTIAL/PLATELET
Abs Immature Granulocytes: 0 10*3/uL (ref 0.00–0.07)
Basophils Absolute: 0 10*3/uL (ref 0.0–0.1)
Basophils Relative: 0 %
Eosinophils Absolute: 0 10*3/uL (ref 0.0–0.5)
Eosinophils Relative: 1 %
HCT: 48.8 % (ref 39.0–52.0)
Hemoglobin: 15.2 g/dL (ref 13.0–17.0)
LYMPHS ABS: 0.1 10*3/uL — AB (ref 0.7–4.0)
Lymphocytes Relative: 6 %
MCH: 28.4 pg (ref 26.0–34.0)
MCHC: 31.1 g/dL (ref 30.0–36.0)
MCV: 91.2 fL (ref 80.0–100.0)
Monocytes Absolute: 0 10*3/uL — ABNORMAL LOW (ref 0.1–1.0)
Monocytes Relative: 1 %
Neutro Abs: 1.7 10*3/uL (ref 1.7–7.7)
Neutrophils Relative %: 92 %
Platelets: 107 10*3/uL — ABNORMAL LOW (ref 150–400)
RBC: 5.35 MIL/uL (ref 4.22–5.81)
RDW: 13.6 % (ref 11.5–15.5)
WBC Morphology: INCREASED
WBC: 1.9 10*3/uL — ABNORMAL LOW (ref 4.0–10.5)
nRBC: 1.1 % — ABNORMAL HIGH (ref 0.0–0.2)

## 2018-07-06 LAB — LACTIC ACID, PLASMA: Lactic Acid, Venous: 2.5 mmol/L (ref 0.5–1.9)

## 2018-07-06 LAB — BRAIN NATRIURETIC PEPTIDE: B Natriuretic Peptide: 554.5 pg/mL — ABNORMAL HIGH (ref 0.0–100.0)

## 2018-07-06 MED ORDER — SODIUM CHLORIDE 0.9 % IV SOLN
1.0000 g | INTRAVENOUS | Status: DC
  Filled 2018-07-06: qty 1

## 2018-07-06 MED ORDER — ACETAMINOPHEN 650 MG RE SUPP
650.0000 mg | Freq: Once | RECTAL | Status: AC
  Administered 2018-07-06: 650 mg via RECTAL
  Filled 2018-07-06: qty 1

## 2018-07-06 MED ORDER — VANCOMYCIN HCL IN DEXTROSE 1-5 GM/200ML-% IV SOLN: 1000.0000 mg | INTRAVENOUS | Status: DC

## 2018-07-06 MED ORDER — VANCOMYCIN HCL IN DEXTROSE 1-5 GM/200ML-% IV SOLN
1000.0000 mg | Freq: Once | INTRAVENOUS | Status: AC
  Administered 2018-07-06: 1000 mg via INTRAVENOUS
  Filled 2018-07-06: qty 200

## 2018-07-06 MED ORDER — SODIUM CHLORIDE 0.9 % IV BOLUS
1000.0000 mL | Freq: Once | INTRAVENOUS | Status: AC
  Administered 2018-07-06: 1000 mL via INTRAVENOUS

## 2018-07-06 MED ORDER — SODIUM CHLORIDE 0.9 % IV SOLN
2.0000 g | Freq: Once | INTRAVENOUS | Status: AC
  Administered 2018-07-06: 2 g via INTRAVENOUS
  Filled 2018-07-06: qty 2

## 2018-07-06 NOTE — ED Triage Notes (Signed)
Pt from [facility] center via ems; called out for aspiration; lung sounds clear with ems; axillary temp 102.5F; malodorous urine; inferior stemi noted on 12 lead by ems; cancelled by cardiology A and O x 1 responds to name; pt has active DNR; ICD in place; normally alert and able to answer questions; HIV positive  BP 84 systolic HR 509 RR 32 CBG 147

## 2018-07-06 NOTE — ED Notes (Signed)
Awaiting BNP results as pt w/ hx of CHF prior to initiating 52ml/kg of NS.

## 2018-07-06 NOTE — Progress Notes (Signed)
Pharmacy Antibiotic Note  Keith Young is a 68 y.o. male admitted on 07/06/2018 with sepsis.    Plan: Cefepime 2 g x 1 then 1 g q24h Vanc 1 g q48h Monitor renal fx cx vanc lvls prn  Height: 5\' 3"  (160 cm) Weight: 164 lb 3.2 oz (74.5 kg) IBW/kg (Calculated) : 56.9  Temp (24hrs), Avg:103.1 F (39.5 C), Min:102.5 F (39.2 C), Max:103.6 F (39.8 C)  Recent Labs  Lab 07/04/18 0700 07/06/18 1908  WBC 3.7*  --   CREATININE 2.44* 3.01*  LATICACIDVEN  --  2.5*    Estimated Creatinine Clearance: 21.8 mL/min (A) (by C-G formula based on SCr of 3.01 mg/dL (H)).    No Known Allergies  Vancomycin 1000 mg IV Q 48 hrs. Goal AUC 400-550. Expected AUC: 454 SCr used: 3.01  Levester Fresh, PharmD, BCPS, BCCCP Clinical Pharmacist [phone]  Please check AMION for all [place] numbers  07/06/2018 8:19 PM

## 2018-07-06 NOTE — ED Notes (Signed)
Patient transported to CT 

## 2018-07-06 NOTE — ED Provider Notes (Addendum)
[facility] EMERGENCY DEPARTMENT Provider Note   CSN: [id number] Arrival date & time: 07/06/18  1851     History   Chief Complaint Chief Complaint  Patient presents with  . Code Sepsis    HPI Keith Young is a 68 y.o. male.  HPI  Presents unresponsive from a rehabilitation facility. Notably, the patient arrives as a code STEMI, but canceled it just prior to arrival. Patient was designated as such by EMS providers after they arrived at his rehabilitation facility due to a call out for unresponsiveness, fever. Per EMS, staff reports that the patient is typically awake and alert, interactive.  On however, this has seemingly changed over the past day, with no interactivity, listlessness, fever. No report of cough, vomiting, diarrhea. There is a report of malodorous urine. EMS providers note that on their arrival the patient had EKG with tachycardia, anterior changes, and was brought here for evaluation. Reported the patient is on medication as directed, receives his care at the rehabilitation facility.   Past Medical History:  Diagnosis Date  . AICD (automatic cardioverter/defibrillator) present   . Alcoholism in remission ([place])   . Anemia   . Aortic insufficiency   . Arthritis   . At moderate risk for fall   . Bell's palsy   . Cardiomyopathy ([place])   . Chronic systolic heart failure (HCC)    NYHA Class III  . CKD (chronic kidney disease) stage 3, GFR 30-59 ml/min (HCC)   . COPD (chronic obstructive pulmonary disease) ([place])   . Essential hypertension, benign   . HIV disease ([place]) 09/06/2016  . Hyperlipidemia   . Noncompliance with medication regimen   . Nonischemic cardiomyopathy (HCC)    EF 15-20%  . NSVT (nonsustained ventricular tachycardia) ([place])   . Numbness of right jaw    Had a stoke in 01/2013. Numbness is occasional, especially when trying to chew.  . Productive cough 08/2013   With brown sputum   . SOB (shortness of breath) on exertion  08/2013  . Stroke ([place]) 01/2013   weakness of right side from CVA  . Type 2 diabetes mellitus ([place])   . Uses hearing aid 2014   recently received new hearing aids    Patient Active Problem List   Diagnosis Date Noted  . Chronic combined systolic and diastolic CHF (congestive heart failure) ([place]) 12/09/2016  . COPD (chronic obstructive pulmonary disease) ([place]) 09/12/2016  . HIV disease ([place]) 09/06/2016  . CVA (cerebral vascular accident) ([place]) 06/26/2016  . ICD (implantable cardioverter-defibrillator) in place 01/22/2015  . Gastric polyp   . Reflux esophagitis   . Anemia in chronic kidney disease 03/26/2014  . Carotid bruit 12/18/2013  . Chronic kidney disease (CKD), active medical management without dialysis, stage 3 (moderate) ([place]) 10/23/2013  . Diabetes mellitus type 2 in nonobese ([place]) 10/23/2013  . Essential hypertension, benign 10/23/2013  . CAD (coronary artery disease) 10/23/2013    Past Surgical History:  Procedure Laterality Date  . BIOPSY N/A 04/10/2014   Procedure: GASTRIC BIOPSY;  Surgeon: Daneil Dolin, MD;  Location: [facility] ORS;  Service: Endoscopy;  Laterality: N/A;  . BIOPSY  10/12/2015   Procedure: BIOPSY;  Surgeon: Daneil Dolin, MD;  Location: [facility] ENDO SUITE;  Service: Endoscopy;;  stomach bx's  . CARDIAC DEFIBRILLATOR PLACEMENT  11/12/12   [employer] Inogen MINI ICD implanted in [place] at [place] AVR per Dr Nelly Laurence note  . COLONOSCOPY WITH PROPOFOL  N/A 04/10/2014   RMR: Colonic Diverticulosis  . ESOPHAGOGASTRODUODENOSCOPY (EGD) WITH PROPOFOL N/A 04/10/2014   RMR: Mild erosive reflux esophagitis. Multiple antral polyps likely hyperplastic status post removal by hot snare cautery technique. Diffusely abnormal stomach status post gastric biopsy I suspect some of patients anemaia may be due to intermittent oozing from the stomach. It would be difficult and a risky proposition to attempt complete removal of  all of his gastric polyps.  . ESOPHAGOGASTRODUODENOSCOPY (EGD) WITH PROPOFOL N/A 10/12/2015   Procedure: ESOPHAGOGASTRODUODENOSCOPY (EGD) WITH PROPOFOL;  Surgeon: Daneil Dolin, MD;  Location: [facility];  Service: Endoscopy;  Laterality: N/A;  7902 - moved to 9:15  . POLYPECTOMY N/A 04/10/2014   Procedure: GASTRIC POLYPECTOMY;  Surgeon: Daneil Dolin, MD;  Location: [facility] ORS;  Service: Endoscopy;  Laterality: N/A;  . RIGHT HEART CATHETERIZATION N/A 02/06/2014   Procedure: RIGHT HEART CATH;  Surgeon: Larey Dresser, MD;  Location: [facility] CATH LAB;  Service: Cardiovascular;  Laterality: N/A;        Home Medications    Prior to Admission medications   Medication Sig Start Date End Date Taking? Authorizing Provider  acetaminophen (TYLENOL) 325 MG tablet Take 2 tablets (650 mg total) by mouth every 4 (four) hours as needed for mild pain (or temp > 37.5 C (99.5 F)). 06/30/16  Yes Sinda Du, MD  aspirin 325 MG tablet Take 1 tablet (325 mg total) by mouth daily. 06/30/16  Yes Sinda Du, MD  atorvastatin (LIPITOR) 80 MG tablet Take 80 mg by mouth daily.  11/22/16  Yes [provider]  bictegravir-emtricitabine-tenofovir AF (BIKTARVY) 50-200-25 MG TABS tablet Take 1 tablet by mouth daily. 04/18/18  Yes Comer, Okey Regal, MD  carvedilol (COREG) 3.125 MG tablet Take 1 tablet (3.125 mg total) by mouth 2 (two) times daily with a meal. 09/12/16  Yes Sinda Du, MD  Cholecalciferol (VITAMIN D3) 2000 UNITS TABS Take 2,000 mg by mouth daily.   Yes [provider]  coal tar (NEUTROGENA T-GEL) 0.5 % shampoo Apply to scalp on shower days (Twice a week) for dandruff and dry scalp Wed., and Sat.   Yes [provider]  digoxin (LANOXIN) 0.125 MG tablet TAKE 1/2 TABLET BY MOUTH DAILY 07/01/16  Yes Bensimhon, Shaune Pascal, MD  docusate sodium (COLACE) 100 MG capsule Take 100 mg by mouth daily.   Yes [provider]  isosorbide mononitrate (IMDUR) 30 MG 24 hr tablet Take 30 mg by  mouth daily.  01/17/18  Yes [provider]  Multiple Vitamin (MULTIVITAMIN WITH MINERALS) TABS tablet Take 1 tablet by mouth daily. 06/30/16  Yes Sinda Du, MD  ranitidine (ZANTAC) 150 MG tablet Take 150 mg by mouth at bedtime.   Yes [provider]  senna-docusate (SENEXON-S) 8.6-50 MG tablet Take 1 tablet by mouth daily.   Yes [provider]  sitaGLIPtin (JANUVIA) 50 MG tablet Take 50 mg by mouth daily.   Yes [provider]  tamsulosin (FLOMAX) 0.4 MG CAPS capsule Take 0.4 mg by mouth daily.   Yes [provider]    Family History Family History  Problem Relation Age of Onset  . Kidney disease Mother   . Kidney disease Sister   . Colon cancer Neg Hx     Social History Social History   Tobacco Use  . Smoking status: Former Smoker    Types: Cigarettes    Last attempt to quit: 11/05/1993    Years since quitting: 24.6  . Smokeless tobacco: Never Used  Substance Use Topics  . Alcohol use: Not Currently    Comment: former heavy ETOH, none recently  . Drug use: No    Comment: prior history of cocaine use, smoking      Allergies   Patient has no known allergies.   Review of Systems Review of Systems  Unable to perform ROS: Patient unresponsive     Physical Exam Updated Vital Signs BP (!) 83/58   Pulse (!) 104   Temp (!) 101.2 F (38.4 C) (Rectal)   Resp (!) 25   Ht 5\' 3"  (1.6 m)   Wt 74.5 kg   SpO2 94%   BMI 29.09 kg/m   Physical Exam Vitals signs and nursing note reviewed.  Constitutional:      Appearance: He is well-developed. He is ill-appearing and diaphoretic.     Comments: Ill-appearing male unresponsive, moves minimally with painful stimuli  HENT:     Head: Normocephalic and atraumatic.     Mouth/Throat:     Mouth: Mucous membranes are dry.  Eyes:     Conjunctiva/sclera: Conjunctivae normal.  Cardiovascular:     Rate and Rhythm: Regular rhythm. Tachycardia present.  Pulmonary:     Effort: Pulmonary  effort is normal. Tachypnea present.     Breath sounds: Decreased breath sounds present.  Abdominal:     General: There is no distension.  Genitourinary:    Penis: Normal.   Skin:    General: Skin is warm.  Neurological:     Mental Status: He is unresponsive.  Psychiatric:        Cognition and Memory: Cognition is impaired.      ED Treatments / Results  Labs (all labs ordered are listed, but only abnormal results are displayed) Labs Reviewed  LACTIC ACID, PLASMA - Abnormal; Notable for the following components:      Result Value   Lactic Acid, Venous 2.5 (*)    All other components within normal limits  COMPREHENSIVE METABOLIC PANEL - Abnormal; Notable for the following components:   Potassium 5.9 (*)    CO2 20 (*)    Glucose, Bld 136 (*)    BUN 43 (*)    Creatinine, Ser 3.01 (*)    Calcium 8.8 (*)    Albumin 3.0 (*)    AST 70 (*)    ALT 46 (*)    GFR calc non Af Amer 21 (*)    GFR calc Af Amer 24 (*)    All other components within normal limits  CBC WITH DIFFERENTIAL/PLATELET - Abnormal; Notable for the following components:   WBC 1.9 (*)    Platelets 107 (*)    nRBC 1.1 (*)    Lymphs Abs 0.1 (*)    Monocytes Absolute 0.0 (*)    All other components within normal limits  BRAIN NATRIURETIC PEPTIDE - Abnormal; Notable for the following components:   B Natriuretic Peptide 554.5 (*)    All other components within normal limits  CULTURE, BLOOD (ROUTINE X 2)  CULTURE, BLOOD (ROUTINE X 2)  LACTIC ACID, PLASMA  URINALYSIS, ROUTINE W REFLEX MICROSCOPIC    EKG None  Radiology Ct Head Wo Contrast  Result Date: 07/06/2018 CLINICAL DATA:  Altered mental status. History of HIV, hypertension, hypercholesterolemia and diabetes. EXAM: CT HEAD WITHOUT CONTRAST TECHNIQUE: Contiguous axial images were obtained from the base of the skull through the vertex without intravenous contrast. COMPARISON:  CT HEAD July 21, 2016 FINDINGS: BRAIN: No intraparenchymal hemorrhage, mass  effect, midline shift or acute large vascular  territory infarcts. Old small RIGHT cerebellar and pontine infarcts. RIGHT temporal occipital encephalomalacia. New subcentimeter RIGHT internal capsule versus basal ganglia hypodensities. Old RIGHT basal ganglia infarct with mild ex vacuo dilatation RIGHT lateral ventricle. Old small bilateral thalami and LEFT basal ganglia infarcts. Confluent supratentorial white matter hypodensities. Moderate parenchymal brain volume loss. No hydrocephalus. VASCULAR: Moderate calcific atherosclerosis of the carotid siphons. SKULL: No skull fracture. No significant scalp soft tissue swelling. SINUSES/ORBITS: Mild paranasal sinus mucosal thickening. Chronic dehiscence of the RIGHT posterolateral maxillary wall. Mastoid air cells are well aerated.The included ocular globes and orbital contents are non-suspicious. OTHER: Bilateral parotid sialoliths. IMPRESSION: 1. New age indeterminate RIGHT basal ganglia small infarcts. 2. Old RIGHT temporal occipital/PCA territory infarct. 3. Old basal ganglia, thalami, pontine and cerebellar small infarcts. Moderate to severe chronic small vessel ischemic changes. 4. Moderate parenchymal brain volume loss, advanced for age. Electronically Signed   By: Elon Alas M.D.   On: 07/06/2018 22:16   Dg Chest Port 1 View  Result Date: 07/06/2018 CLINICAL DATA:  Sepsis EXAM: PORTABLE CHEST 1 VIEW COMPARISON:  Portable exam 2023 hours compared to 12/09/2016 FINDINGS: LEFT subclavian AICD with lead projecting over RIGHT ventricle. External pacing lead present. Enlargement of cardiac silhouette with postsurgical changes of CABG. Atherosclerotic calcification aorta. Mediastinal contours normal. Subsegmental atelectasis LEFT lower lobe. Pulmonary vascular congestion with interstitial prominence at the mid to lower lungs which could represent minimal pulmonary edema. No pleural effusion or pneumothorax. Bones demineralized. IMPRESSION: Enlargement of  cardiac silhouette with vascular congestion and question minimal pulmonary edema. Electronically Signed   By: Lavonia Dana M.D.   On: 07/06/2018 20:35    Procedures Procedures (including critical care time)  Medications Ordered in ED Medications  vancomycin (VANCOCIN) IVPB 1000 mg/200 mL premix (has no administration in time range)  ceFEPIme (MAXIPIME) 1 g in sodium chloride 0.9 % 100 mL IVPB (has no administration in time range)  ceFEPIme (MAXIPIME) 2 g in sodium chloride 0.9 % 100 mL IVPB (0 g Intravenous Stopped 07/06/18 2001)  vancomycin (VANCOCIN) IVPB 1000 mg/200 mL premix (0 mg Intravenous Stopped 07/06/18 2035)  acetaminophen (TYLENOL) suppository 650 mg (650 mg Rectal Given 07/06/18 1935)  sodium chloride 0.9 % bolus 1,000 mL (0 mLs Intravenous Stopped 07/06/18 2032)  sodium chloride 0.9 % bolus 1,000 mL (0 mLs Intravenous Stopped 07/06/18 2234)     Initial Impression / Assessment and Plan / ED Course  I have reviewed the triage vital signs and the nursing notes.  Pertinent labs & imaging results that were available during my care of the patient were reviewed by me and considered in my medical decision making (see chart for details).    Patient has been designated as a DNR by nursing home staff, consistent with documentation available on the EMR.  I reviewed the patient's documentation, notable for multiple medical issues including CHF, prior stroke, CKD, HIV. EMR also notable for multiple visits with his nursing home physicians in the past few weeks, he had episode earlier today with designation of unresponsiveness, fever concern for sepsis, requested ED transfer.   Initial labs notable for lactic acidosis, hyperkalemia.  Update: Patient's x-ray consistent with pulmonary edema patient has elevated BNP.  Blood pressure is improved with 1 L fluid resuscitation, though he has mild tachycardia, fluids will be slowed, to monitor his pulmonary status.  Patient has improved WOB, but remains  unresponsive.   10:58 PM Patient now awake. Is oriented to self, grossly to place and time. He knows that he  is in the hospital, states that he feels somewhat better, cannot specify what occurred earlier in the day. I reviewed the patient's findings with him thus far, and discussed with our neurologist given CT evidence for age-indeterminate stroke, and today's episode of unresponsiveness. Patient has received fluid resuscitation, broad-spectrum antibiotics.  On given his history of heart failure, improved blood pressure, full 30 mL/kg bolus not completed. Given his cognitive improvement, absence of additional hemodynamic stability, the patient is appropriate for admission for further monitoring, management.  12:07 AM Patient nearly finished w 30ml/kg resuscitation.  This was provided judiciously due to CHF.  However the patient has had continued clinical improvement.  Final Clinical Impressions(s) / ED Diagnoses  Sepsis Unresponsiveness  CRITICAL CARE Performed by: Carmin Muskrat Total critical care time: 35 minutes Critical care time was exclusive of separately billable procedures and treating other patients. Critical care was necessary to treat or prevent imminent or life-threatening deterioration. Critical care was time spent personally by me on the following activities: development of treatment plan with patient and/or surrogate as well as nursing, discussions with consultants, evaluation of patient's response to treatment, examination of patient, obtaining history from patient or surrogate, ordering and performing treatments and interventions, ordering and review of laboratory studies, ordering and review of radiographic studies, pulse oximetry and re-evaluation of patient's condition.    Carmin Muskrat, MD 07/06/18 Candida Peeling    Carmin Muskrat, MD 07/07/18 [phone]

## 2018-07-06 NOTE — Progress Notes (Signed)
This is an acute visit.  Level of care skilled.  Facility is [facility].  Chief complaint acute visit secondary to change in mental status lethargy axillary temperature of over 102  History of present illness.  Patient is a pleasant 68 year old male who is a long-term resident of the facility.  He has a history of systolic CHF ejection fraction 30% as well as chronic kidney disease followed by nephrology also a history of AICD placement followed by cardiology.  He also has a history of HIV followed by infectious disease-as well as type 2 diabetes COPD hypertension and a history of CVA.  He has been quite stable now for an extended period of time however this evening apparently he had an acute change where he  Vomited became lethargic with decreased responsiveness.  He was found to be significantly warm to touch his temperature was taken axillary and it was in excess of 102 degrees.  He was also hypotensive with a blood pressure in the 80H systolically.  Oxygen saturation was in the mid 90s on room air  Per nursing he was in his usual state of health today and this was a fairly sudden change- he is not really talking although he appears to attempt to verbalize . Past Medical History:  Diagnosis Date  . AICD (automatic cardioverter/defibrillator) present   . Alcoholism in remission ([place])   . Anemia   . Aortic insufficiency   . Arthritis   . At moderate risk for fall   . Bell's palsy   . Cardiomyopathy ([place])   . Chronic systolic heart failure (HCC)    NYHA Class III  . CKD (chronic kidney disease) stage 3, GFR 30-59 ml/min (HCC)   . COPD (chronic obstructive pulmonary disease) ([place])   . Essential hypertension, benign   . HIV disease ([place]) 09/06/2016  . Hyperlipidemia   . Noncompliance with medication regimen   . Nonischemic cardiomyopathy (HCC)    EF 15-20%  . NSVT (nonsustained ventricular tachycardia) ([place])   . Numbness of right jaw    Had a stoke  in 01/2013. Numbness is occasional, especially when trying to chew.  . Productive cough 08/2013   With brown sputum   . SOB (shortness of breath) on exertion 08/2013  . Stroke ([place]) 01/2013   weakness of right side from CVA  . Type 2 diabetes mellitus ([place])   . Uses hearing aid 2014   recently received new hearing aids   Past Surgical History:  Procedure Laterality Date  . BIOPSY N/A 04/10/2014   Procedure: GASTRIC BIOPSY;  Surgeon: Daneil Dolin, MD;  Location: [facility] ORS;  Service: Endoscopy;  Laterality: N/A;  . BIOPSY  10/12/2015   Procedure: BIOPSY;  Surgeon: Daneil Dolin, MD;  [facility];  Service: Endoscopy;;  stomach bx's  . CARDIAC DEFIBRILLATOR PLACEMENT  11/12/12   [employer] Inogen MINI ICD implanted in [place] at [place] AV[facility] per Dr Nelly Laurence note  . COLONOSCOPY WITH PROPOFOL N/A 04/10/2014   RMR: Colonic Diverticulosis  . ESOPHAGOGASTRODUODENOSCOPY (EGD) WITH PROPOFOL N/A 04/10/2014   RMR: Mild erosive reflux esophagitis. Multiple antral polyps likely hyperplastic status post removal by hot snare cautery technique. Diffusely abnormal stomach status post gastric biopsy I suspect some of patients anemaia may be due to intermittent oozing from the stomach. It would be difficult and a risky proposition to attempt complete removal of all of his gastric polyps.  . ESOPHAGOGASTRODUODENOSCOPY (EGD) WITH  PROPOFOL N/A 10/12/2015   Procedure: ESOPHAGOGASTRODUODENOSCOPY (EGD) WITH PROPOFOL;  Surgeon: Daneil Dolin, MD;  [facility];  Service: Endoscopy;  Laterality: N/A;  9702 - moved to 9:15  . POLYPECTOMY N/A 04/10/2014   Procedure: GASTRIC POLYPECTOMY;  Surgeon: Daneil Dolin, MD;  Location: [facility] ORS;  Service: Endoscopy;  Laterality: N/A;  . RIGHT HEART CATHETERIZATION N/A 02/06/2014   Procedure: RIGHT HEART CATH;  Surgeon: Larey Dresser, MD;  Location: [facility] CATH LAB;  Service:  Cardiovascular;  Laterality: N/A;    No Known Allergies  MEDICATIONS       Medication Sig  . acetaminophen (TYLENOL) 325 MG tablet Take 2 tablets (650 mg total) by mouth every 4 (four) hours as needed for mild pain (or temp > 37.5 C (99.5 F)).  Marland Kitchen aspirin 325 MG tablet Take 1 tablet (325 mg total) by mouth daily.  Marland Kitchen atorvastatin (LIPITOR) 80 MG tablet Take 80 mg by mouth daily.   . bictegravir-emtricitabine-tenofovir AF (BIKTARVY) 50-200-25 MG TABS tablet Take 1 tablet by mouth daily.  . carvedilol (COREG) 3.125 MG tablet Take 1 tablet (3.125 mg total) by mouth 2 (two) times daily with a meal.  . Cholecalciferol (VITAMIN D3) 2000 UNITS TABS Take 2,000 mg by mouth daily.  . coal tar (NEUTROGENA T-GEL) 0.5 % shampoo Apply to scalp on shower days (Twice a week) for dandruff and dry scalp Wed., and Sat.  . digoxin (LANOXIN) 0.125 MG tablet TAKE 1/2 TABLET BY MOUTH DAILY  . docusate sodium (COLACE) 100 MG capsule Take 100 mg by mouth daily.  . isosorbide mononitrate (IMDUR) 30 MG 24 hr tablet Take 30 mg by mouth daily.   . Multiple Vitamin (MULTIVITAMIN WITH MINERALS) TABS tablet Take 1 tablet by mouth daily.  . ranitidine (ZANTAC) 150 MG tablet Take 150 mg by mouth at bedtime.  . senna-docusate (SENEXON-S) 8.6-50 MG tablet Take 1 tablet by mouth daily.  . sitaGLIPtin (JANUVIA) 50 MG tablet Take 50 mg by mouth daily.  . tamsulosin (FLOMAX) 0.4 MG CAPS capsule Take 0.4 mg by mouth daily.   No facility-administered encounter medications on file as of 07/03/2018.    Review of systems is unobtainable secondary to patient not talking l complicated with baseline cognitive impairment   Physical exam.  Temperature is 102.5 axillary-pulse is 120- respirations are 26 blood pressure 83/67  Oxygen saturation was 95% on room air   In general this is an elderly male who appears to be in some distress  he is not verbalizing does make eye contact.  His skin is increased warm to touch.  Eyes  extraocular movements appear to be intact visual acuity appears to be intact he does make eye contact.  Oropharynx he does have a slight film on his tongue mucous membranes appear moist he is not opening his mouth very wide which limits the assessment.  Chest he does not really follow verbal commands I cannot appreciate overt congestion there is shallow air entry and slightly labored breathing.  Heart is tachycardic do not really appreciate a murmur gallop or rub.  Abdomen is soft does not appear to be acutely tender there are somewhat hypoactive bowel sounds.  Musculoskeletal he is not really following verbal commands- when asked to grip my fingers he does not really attempt to do this--- he would not move his legs on command.  Neurologic as noted above again he appears to be alert does make eye contact attempts to speak but is not verbalizing.  Psych as  noted above could not really do an assessment.  Labs.  July 04, 2017.  WBC 3.7 hemoglobin 14.0 platelets 124,000.  Sodium 139 potassium 4.7 BUN 45 creatinine 2.44  BNP 697   Assessment and plan.  Acute change in mental status with fever of unknown origin and tachycardia with hypotension- apparently started after vomiting episode-- this appears  Most likely  to be a septic type presentation- he appears he could be acutely ill will send him to the ER via EMS for evaluation.  [id number]

## 2018-07-07 ENCOUNTER — Inpatient Hospital Stay (HOSPITAL_COMMUNITY): Payer: Medicare HMO

## 2018-07-07 DIAGNOSIS — I5042 Chronic combined systolic (congestive) and diastolic (congestive) heart failure: Secondary | ICD-10-CM | POA: Diagnosis present

## 2018-07-07 DIAGNOSIS — R7881 Bacteremia: Secondary | ICD-10-CM | POA: Diagnosis not present

## 2018-07-07 DIAGNOSIS — E1122 Type 2 diabetes mellitus with diabetic chronic kidney disease: Secondary | ICD-10-CM | POA: Diagnosis present

## 2018-07-07 DIAGNOSIS — N179 Acute kidney failure, unspecified: Secondary | ICD-10-CM | POA: Diagnosis not present

## 2018-07-07 DIAGNOSIS — R410 Disorientation, unspecified: Secondary | ICD-10-CM | POA: Diagnosis not present

## 2018-07-07 DIAGNOSIS — J441 Chronic obstructive pulmonary disease with (acute) exacerbation: Secondary | ICD-10-CM | POA: Diagnosis present

## 2018-07-07 DIAGNOSIS — R6521 Severe sepsis with septic shock: Secondary | ICD-10-CM | POA: Diagnosis present

## 2018-07-07 DIAGNOSIS — N185 Chronic kidney disease, stage 5: Secondary | ICD-10-CM | POA: Diagnosis not present

## 2018-07-07 DIAGNOSIS — E119 Type 2 diabetes mellitus without complications: Secondary | ICD-10-CM | POA: Diagnosis not present

## 2018-07-07 DIAGNOSIS — A419 Sepsis, unspecified organism: Secondary | ICD-10-CM | POA: Diagnosis not present

## 2018-07-07 DIAGNOSIS — B9620 Unspecified Escherichia coli [E. coli] as the cause of diseases classified elsewhere: Secondary | ICD-10-CM | POA: Diagnosis not present

## 2018-07-07 DIAGNOSIS — I428 Other cardiomyopathies: Secondary | ICD-10-CM | POA: Diagnosis present

## 2018-07-07 DIAGNOSIS — I480 Paroxysmal atrial fibrillation: Secondary | ICD-10-CM | POA: Diagnosis not present

## 2018-07-07 DIAGNOSIS — R9431 Abnormal electrocardiogram [ECG] [EKG]: Secondary | ICD-10-CM | POA: Diagnosis not present

## 2018-07-07 DIAGNOSIS — I6389 Other cerebral infarction: Secondary | ICD-10-CM | POA: Diagnosis present

## 2018-07-07 DIAGNOSIS — G9340 Encephalopathy, unspecified: Secondary | ICD-10-CM | POA: Diagnosis not present

## 2018-07-07 DIAGNOSIS — I472 Ventricular tachycardia: Secondary | ICD-10-CM | POA: Diagnosis not present

## 2018-07-07 DIAGNOSIS — E11649 Type 2 diabetes mellitus with hypoglycemia without coma: Secondary | ICD-10-CM | POA: Diagnosis present

## 2018-07-07 DIAGNOSIS — A4151 Sepsis due to Escherichia coli [E. coli]: Secondary | ICD-10-CM | POA: Diagnosis present

## 2018-07-07 DIAGNOSIS — B20 Human immunodeficiency virus [HIV] disease: Secondary | ICD-10-CM | POA: Diagnosis present

## 2018-07-07 DIAGNOSIS — G9341 Metabolic encephalopathy: Secondary | ICD-10-CM | POA: Diagnosis present

## 2018-07-07 DIAGNOSIS — K5790 Diverticulosis of intestine, part unspecified, without perforation or abscess without bleeding: Secondary | ICD-10-CM | POA: Diagnosis present

## 2018-07-07 DIAGNOSIS — G9349 Other encephalopathy: Secondary | ICD-10-CM | POA: Diagnosis present

## 2018-07-07 DIAGNOSIS — N390 Urinary tract infection, site not specified: Secondary | ICD-10-CM | POA: Diagnosis present

## 2018-07-07 DIAGNOSIS — N170 Acute kidney failure with tubular necrosis: Secondary | ICD-10-CM | POA: Diagnosis present

## 2018-07-07 DIAGNOSIS — R4182 Altered mental status, unspecified: Secondary | ICD-10-CM | POA: Diagnosis present

## 2018-07-07 DIAGNOSIS — D631 Anemia in chronic kidney disease: Secondary | ICD-10-CM | POA: Diagnosis present

## 2018-07-07 DIAGNOSIS — G0490 Encephalitis and encephalomyelitis, unspecified: Secondary | ICD-10-CM | POA: Diagnosis present

## 2018-07-07 DIAGNOSIS — E43 Unspecified severe protein-calorie malnutrition: Secondary | ICD-10-CM | POA: Diagnosis present

## 2018-07-07 DIAGNOSIS — N184 Chronic kidney disease, stage 4 (severe): Secondary | ICD-10-CM | POA: Diagnosis not present

## 2018-07-07 DIAGNOSIS — Z01810 Encounter for preprocedural cardiovascular examination: Secondary | ICD-10-CM | POA: Diagnosis not present

## 2018-07-07 DIAGNOSIS — A0472 Enterocolitis due to Clostridium difficile, not specified as recurrent: Secondary | ICD-10-CM | POA: Diagnosis not present

## 2018-07-07 DIAGNOSIS — I132 Hypertensive heart and chronic kidney disease with heart failure and with stage 5 chronic kidney disease, or end stage renal disease: Secondary | ICD-10-CM | POA: Diagnosis present

## 2018-07-07 DIAGNOSIS — Z6826 Body mass index (BMI) 26.0-26.9, adult: Secondary | ICD-10-CM | POA: Diagnosis not present

## 2018-07-07 DIAGNOSIS — I10 Essential (primary) hypertension: Secondary | ICD-10-CM | POA: Diagnosis not present

## 2018-07-07 DIAGNOSIS — D6959 Other secondary thrombocytopenia: Secondary | ICD-10-CM | POA: Diagnosis present

## 2018-07-07 DIAGNOSIS — N183 Chronic kidney disease, stage 3 (moderate): Secondary | ICD-10-CM | POA: Diagnosis not present

## 2018-07-07 DIAGNOSIS — N186 End stage renal disease: Secondary | ICD-10-CM | POA: Diagnosis present

## 2018-07-07 DIAGNOSIS — G253 Myoclonus: Secondary | ICD-10-CM | POA: Diagnosis not present

## 2018-07-07 DIAGNOSIS — E785 Hyperlipidemia, unspecified: Secondary | ICD-10-CM | POA: Diagnosis present

## 2018-07-07 DIAGNOSIS — G8194 Hemiplegia, unspecified affecting left nondominant side: Secondary | ICD-10-CM | POA: Diagnosis present

## 2018-07-07 LAB — BLOOD CULTURE ID PANEL (REFLEXED)
Acinetobacter baumannii: NOT DETECTED
Candida albicans: NOT DETECTED
Candida glabrata: NOT DETECTED
Candida krusei: NOT DETECTED
Candida parapsilosis: NOT DETECTED
Candida tropicalis: NOT DETECTED
Carbapenem resistance: NOT DETECTED
Enterobacter cloacae complex: NOT DETECTED
Enterobacteriaceae species: DETECTED — AB
Enterococcus species: NOT DETECTED
Escherichia coli: DETECTED — AB
HAEMOPHILUS INFLUENZAE: NOT DETECTED
Klebsiella oxytoca: NOT DETECTED
Klebsiella pneumoniae: NOT DETECTED
Listeria monocytogenes: NOT DETECTED
Neisseria meningitidis: NOT DETECTED
Proteus species: NOT DETECTED
Pseudomonas aeruginosa: NOT DETECTED
STREPTOCOCCUS AGALACTIAE: NOT DETECTED
STREPTOCOCCUS PNEUMONIAE: NOT DETECTED
Serratia marcescens: NOT DETECTED
Staphylococcus aureus (BCID): NOT DETECTED
Staphylococcus species: NOT DETECTED
Streptococcus pyogenes: NOT DETECTED
Streptococcus species: NOT DETECTED

## 2018-07-07 LAB — CBC
HCT: 43.1 % (ref 39.0–52.0)
HCT: 42.4 % (ref 39.0–52.0)
Hemoglobin: 13.1 g/dL (ref 13.0–17.0)
Hemoglobin: 12.4 g/dL — ABNORMAL LOW (ref 13.0–17.0)
MCH: 28.5 pg (ref 26.0–34.0)
MCH: 28.2 pg (ref 26.0–34.0)
MCHC: 30.4 g/dL (ref 30.0–36.0)
MCHC: 29.2 g/dL — ABNORMAL LOW (ref 30.0–36.0)
MCV: 93.9 fL (ref 80.0–100.0)
MCV: 96.6 fL (ref 80.0–100.0)
PLATELETS: 87 10*3/uL — AB (ref 150–400)
Platelets: 80 10*3/uL — ABNORMAL LOW (ref 150–400)
RBC: 4.59 MIL/uL (ref 4.22–5.81)
RBC: 4.39 MIL/uL (ref 4.22–5.81)
RDW: 14 % (ref 11.5–15.5)
RDW: 14.4 % (ref 11.5–15.5)
WBC: 10.3 10*3/uL (ref 4.0–10.5)
WBC: 19.7 10*3/uL — ABNORMAL HIGH (ref 4.0–10.5)
nRBC: 0 % (ref 0.0–0.2)
nRBC: 0 % (ref 0.0–0.2)

## 2018-07-07 LAB — COMPREHENSIVE METABOLIC PANEL
ALT: 216 U/L — ABNORMAL HIGH (ref 0–44)
ALT: 165 U/L — ABNORMAL HIGH (ref 0–44)
ANION GAP: 13 (ref 5–15)
AST: 320 U/L — ABNORMAL HIGH (ref 15–41)
AST: 161 U/L — ABNORMAL HIGH (ref 15–41)
Albumin: 2.5 g/dL — ABNORMAL LOW (ref 3.5–5.0)
Albumin: 2.5 g/dL — ABNORMAL LOW (ref 3.5–5.0)
Alkaline Phosphatase: 93 U/L (ref 38–126)
Alkaline Phosphatase: 84 U/L (ref 38–126)
Anion gap: 12 (ref 5–15)
BUN: 47 mg/dL — ABNORMAL HIGH (ref 8–23)
BUN: 54 mg/dL — ABNORMAL HIGH (ref 8–23)
CHLORIDE: 106 mmol/L (ref 98–111)
CO2: 20 mmol/L — ABNORMAL LOW (ref 22–32)
CO2: 18 mmol/L — ABNORMAL LOW (ref 22–32)
Calcium: 7.9 mg/dL — ABNORMAL LOW (ref 8.9–10.3)
Calcium: 8 mg/dL — ABNORMAL LOW (ref 8.9–10.3)
Chloride: 109 mmol/L (ref 98–111)
Creatinine, Ser: 3.72 mg/dL — ABNORMAL HIGH (ref 0.61–1.24)
Creatinine, Ser: 4.24 mg/dL — ABNORMAL HIGH (ref 0.61–1.24)
GFR calc Af Amer: 18 mL/min — ABNORMAL LOW (ref >60)
GFR calc non Af Amer: 16 mL/min — ABNORMAL LOW (ref >60)
GFR calc non Af Amer: 14 mL/min — ABNORMAL LOW (ref >60)
GFR, EST AFRICAN AMERICAN: 16 mL/min — AB (ref >60)
Glucose, Bld: 87 mg/dL (ref 70–99)
Glucose, Bld: 94 mg/dL (ref 70–99)
Potassium: 5.6 mmol/L — ABNORMAL HIGH (ref 3.5–5.1)
Potassium: 5.5 mmol/L — ABNORMAL HIGH (ref 3.5–5.1)
Sodium: 139 mmol/L (ref 135–145)
Sodium: 139 mmol/L (ref 135–145)
Total Bilirubin: 1 mg/dL (ref 0.3–1.2)
Total Bilirubin: 1.1 mg/dL (ref 0.3–1.2)
Total Protein: 6.2 g/dL — ABNORMAL LOW (ref 6.5–8.1)
Total Protein: 6.1 g/dL — ABNORMAL LOW (ref 6.5–8.1)

## 2018-07-07 LAB — INFLUENZA PANEL BY PCR (TYPE A & B)
Influenza A By PCR: NEGATIVE
Influenza B By PCR: NEGATIVE

## 2018-07-07 LAB — LACTIC ACID, PLASMA
Lactic Acid, Venous: 1.6 mmol/L (ref 0.5–1.9)
Lactic Acid, Venous: 3 mmol/L (ref 0.5–1.9)
Lactic Acid, Venous: 3.3 mmol/L (ref 0.5–1.9)
Lactic Acid, Venous: 3 mmol/L (ref 0.5–1.9)

## 2018-07-07 LAB — CBG MONITORING, ED
Glucose-Capillary: 76 mg/dL (ref 70–99)
Glucose-Capillary: 83 mg/dL (ref 70–99)
Glucose-Capillary: 83 mg/dL (ref 70–99)

## 2018-07-07 LAB — GLUCOSE, CAPILLARY
Glucose-Capillary: 86 mg/dL (ref 70–99)
Glucose-Capillary: 116 mg/dL — ABNORMAL HIGH (ref 70–99)

## 2018-07-07 LAB — MRSA PCR SCREENING: MRSA by PCR: NEGATIVE

## 2018-07-07 MED ORDER — SENNOSIDES-DOCUSATE SODIUM 8.6-50 MG PO TABS: 1.0000 | ORAL_TABLET | Freq: Every day | ORAL | Status: DC

## 2018-07-07 MED ORDER — ADULT MULTIVITAMIN W/MINERALS CH: 1.0000 | ORAL_TABLET | Freq: Every day | ORAL | Status: DC

## 2018-07-07 MED ORDER — SODIUM CHLORIDE 0.9 % IV BOLUS
250.0000 mL | Freq: Once | INTRAVENOUS | Status: AC
  Administered 2018-07-07: 250 mL via INTRAVENOUS

## 2018-07-07 MED ORDER — INSULIN ASPART 100 UNIT/ML ~~LOC~~ SOLN
0.0000 [IU] | SUBCUTANEOUS | Status: DC
  Administered 2018-07-08 – 2018-07-13 (×3): 1 [IU] via SUBCUTANEOUS
  Administered 2018-07-13: 2 [IU] via SUBCUTANEOUS
  Administered 2018-07-13 (×2): 1 [IU] via SUBCUTANEOUS
  Administered 2018-07-13 – 2018-07-14 (×2): 2 [IU] via SUBCUTANEOUS
  Administered 2018-07-14: 1 [IU] via SUBCUTANEOUS
  Administered 2018-07-14: 3 [IU] via SUBCUTANEOUS
  Administered 2018-07-14 (×2): 1 [IU] via SUBCUTANEOUS
  Administered 2018-07-14: 2 [IU] via SUBCUTANEOUS
  Administered 2018-07-15 (×2): 5 [IU] via SUBCUTANEOUS
  Administered 2018-07-15: 1 [IU] via SUBCUTANEOUS
  Administered 2018-07-16: 2 [IU] via SUBCUTANEOUS
  Administered 2018-07-16: 5 [IU] via SUBCUTANEOUS
  Administered 2018-07-16 (×2): 2 [IU] via SUBCUTANEOUS
  Administered 2018-07-16: 3 [IU] via SUBCUTANEOUS
  Administered 2018-07-17: 2 [IU] via SUBCUTANEOUS
  Administered 2018-07-17: 7 [IU] via SUBCUTANEOUS
  Administered 2018-07-18 (×2): 5 [IU] via SUBCUTANEOUS
  Administered 2018-07-18: 1 [IU] via SUBCUTANEOUS

## 2018-07-07 MED ORDER — FAMOTIDINE 20 MG PO TABS: 20.0000 mg | ORAL_TABLET | Freq: Every day | ORAL | Status: DC

## 2018-07-07 MED ORDER — ACETAMINOPHEN 650 MG RE SUPP: 650.0000 mg | Freq: Four times a day (QID) | RECTAL | Status: DC | PRN

## 2018-07-07 MED ORDER — ONDANSETRON HCL 4 MG PO TABS: 4.0000 mg | ORAL_TABLET | Freq: Four times a day (QID) | ORAL | Status: DC | PRN

## 2018-07-07 MED ORDER — ASPIRIN 325 MG PO TABS: 325.0000 mg | ORAL_TABLET | Freq: Every day | ORAL | Status: DC

## 2018-07-07 MED ORDER — BICTEGRAVIR-EMTRICITAB-TENOFOV 50-200-25 MG PO TABS: 1.0000 | ORAL_TABLET | Freq: Every day | ORAL | Status: DC

## 2018-07-07 MED ORDER — DOCUSATE SODIUM 100 MG PO CAPS: 100.0000 mg | ORAL_CAPSULE | Freq: Every day | ORAL | Status: DC

## 2018-07-07 MED ORDER — DIGOXIN 125 MCG PO TABS: 62.5000 ug | ORAL_TABLET | Freq: Every day | ORAL | Status: DC

## 2018-07-07 MED ORDER — TAMSULOSIN HCL 0.4 MG PO CAPS: 0.4000 mg | ORAL_CAPSULE | Freq: Every day | ORAL | Status: DC

## 2018-07-07 MED ORDER — ACETAMINOPHEN 325 MG PO TABS
650.0000 mg | ORAL_TABLET | Freq: Four times a day (QID) | ORAL | Status: DC | PRN
  Administered 2018-07-10: 650 mg via ORAL
  Filled 2018-07-07: qty 2

## 2018-07-07 MED ORDER — ENOXAPARIN SODIUM 30 MG/0.3ML ~~LOC~~ SOLN
30.0000 mg | SUBCUTANEOUS | Status: DC
  Administered 2018-07-08 – 2018-07-09 (×2): 30 mg via SUBCUTANEOUS
  Filled 2018-07-07 (×3): qty 0.3

## 2018-07-07 MED ORDER — DIGOXIN 0.25 MG/ML IJ SOLN
0.1250 mg | Freq: Every day | INTRAMUSCULAR | Status: DC
  Administered 2018-07-07 – 2018-07-09 (×3): 0.125 mg via INTRAVENOUS
  Filled 2018-07-07 (×3): qty 2

## 2018-07-07 MED ORDER — ONDANSETRON HCL 4 MG/2ML IJ SOLN
4.0000 mg | Freq: Four times a day (QID) | INTRAMUSCULAR | Status: DC | PRN
  Administered 2018-07-07: 4 mg via INTRAVENOUS
  Filled 2018-07-07: qty 2

## 2018-07-07 MED ORDER — ATORVASTATIN CALCIUM 80 MG PO TABS: 80.0000 mg | ORAL_TABLET | Freq: Every day | ORAL | Status: DC

## 2018-07-07 MED ORDER — SODIUM CHLORIDE 0.9 % IV SOLN: INTRAVENOUS | Status: DC

## 2018-07-07 MED ORDER — SODIUM CHLORIDE 0.9 % IV SOLN
2.0000 g | INTRAVENOUS | Status: DC
  Administered 2018-07-07 – 2018-07-08 (×2): 2 g via INTRAVENOUS
  Filled 2018-07-07 (×2): qty 20

## 2018-07-07 NOTE — ED Notes (Signed)
Pt CBG 83. Notified Luellen Pucker, Therapist, sports.

## 2018-07-07 NOTE — Progress Notes (Signed)
Patient trasfered from ed to [phone] via stretcher; alert, disoriented x 4; no sign/symptoms of pain or other discomfort; IV saline locked in LFA, LW and RFA; skin intact, dry. Fall risk precautions - initiated. Will continue to monitor the patient.

## 2018-07-07 NOTE — Consult Note (Signed)
[facility]    NEPHROLOGY CONSULTATION NOTE  PATIENT ID:  Keith Young, DOB:  [birth year]  HPI: The patient is a 68 y.o. year old male with a past medical history significant for systolic congestive heart failure with an ejection fraction of 30%, chronic kidney disease stage IV with a baseline serum creatinine around 2.4 from 07/04/2018 who presented to the emergency department when he had an acute change in his mental status where he vomited and became less lethargic.  He was found to be febrile with a temperature of 102, and notably hypotensive with a systolic blood pressure in the 80s.  He came to the emergency department and received 30 cc/kg IV fluid bolus.  He remained hypotensive.  Broad-spectrum antibiotics were started.  His serum creatinine has worsened to 4.2, with minimal urine output.  Renal consultation has been called for acute kidney injury.  Past Medical History:  Diagnosis Date  . AICD (automatic cardioverter/defibrillator) present   . Alcoholism in remission ([place])   . Anemia   . Aortic insufficiency   . Arthritis   . At moderate risk for fall   . Bell's palsy   . Cardiomyopathy ([place])   . Chronic systolic heart failure (HCC)    NYHA Class III  . CKD (chronic kidney disease) stage 3, GFR 30-59 ml/min (HCC)   . COPD (chronic obstructive pulmonary disease) ([place])   . Essential hypertension, benign   . HIV disease ([place]) 09/06/2016  . Hyperlipidemia   . Noncompliance with medication regimen   . Nonischemic cardiomyopathy (HCC)    EF 15-20%  . NSVT (nonsustained ventricular tachycardia) ([place])   . Numbness of right jaw    Had a stoke in 01/2013. Numbness is occasional, especially when trying to chew.  . Productive cough 08/2013   With brown sputum   . SOB (shortness of breath) on exertion 08/2013  . Stroke ([place]) 01/2013   weakness of right side from CVA  . Type 2 diabetes mellitus ([place])   . Uses hearing aid 2014   recently received new hearing aids     Past Surgical History:  Procedure Laterality Date  . BIOPSY N/A 04/10/2014   Procedure: GASTRIC BIOPSY;  Surgeon: Daneil Dolin, MD;  Location: [facility] ORS;  Service: Endoscopy;  Laterality: N/A;  . BIOPSY  10/12/2015   Procedure: BIOPSY;  Surgeon: Daneil Dolin, MD;  Location: [facility] ENDO SUITE;  Service: Endoscopy;;  stomach bx's  . CARDIAC DEFIBRILLATOR PLACEMENT  11/12/12   [employer] Inogen MINI ICD implanted in [place] at [facility] per Dr Nelly Laurence note  . COLONOSCOPY WITH PROPOFOL N/A 04/10/2014   RMR: Colonic Diverticulosis  . ESOPHAGOGASTRODUODENOSCOPY (EGD) WITH PROPOFOL N/A 04/10/2014   RMR: Mild erosive reflux esophagitis. Multiple antral polyps likely hyperplastic status post removal by hot snare cautery technique. Diffusely abnormal stomach status post gastric biopsy I suspect some of patients anemaia may be due to intermittent oozing from the stomach. It would be difficult and a risky proposition to attempt complete removal of all of his gastric polyps.  . ESOPHAGOGASTRODUODENOSCOPY (EGD) WITH PROPOFOL N/A 10/12/2015   Procedure: ESOPHAGOGASTRODUODENOSCOPY (EGD) WITH PROPOFOL;  Surgeon: Daneil Dolin, MD;  Location: [facility];  Service: Endoscopy;  Laterality: N/A;  8850 - moved to 9:15  . POLYPECTOMY N/A 04/10/2014   Procedure: GASTRIC POLYPECTOMY;  Surgeon: Daneil Dolin, MD;  Location: [facility];  Service: Endoscopy;  Laterality: N/A;  . RIGHT  HEART CATHETERIZATION N/A 02/06/2014   Procedure: RIGHT HEART CATH;  Surgeon: Larey Dresser, MD;  Location: [facility] CATH LAB;  Service: Cardiovascular;  Laterality: N/A;    Family History  Problem Relation Age of Onset  . Kidney disease Mother   . Kidney disease Sister   . Colon cancer Neg Hx     Social History   Tobacco Use  . Smoking status: Former Smoker    Types: Cigarettes    Last attempt to quit: 11/05/1993    Years since quitting: 24.6  . Smokeless tobacco: Never  Used  Substance Use Topics  . Alcohol use: Not Currently    Comment: former heavy ETOH, none recently  . Drug use: No    Comment: prior history of cocaine use, smoking     REVIEW OF SYSTEMS: Unable to obtain secondary to lethargy.   PHYSICAL EXAM:  Vitals:   07/07/18 1630 07/07/18 1651  BP: (!) 87/62 (!) 93/59  Pulse:    Resp: (!) 25 (!) 25  Temp: 99.5 F (37.5 C) 99.3 F (37.4 C)  SpO2: 94% 91%   I/O last 3 completed shifts: In: 2550 [IV Piggyback:2550] Out: -    General: Lethargic NAD HEENT: MMM [place] AT anicteric sclera Neck:  No JVD, no adenopathy CV:  Heart RRR  Lungs:  L/S CTA bilaterally Abd:  abd SNT/ND with normal BS GU:  Bladder non-palpable [place] catheter in place Extremities:  No LE edema. Skin:  No skin rash Psych:  normal mood and affect Neuro:  no focal deficits  MEDICATIONS:  Current Meds  Medication Sig  . acetaminophen (TYLENOL) 325 MG tablet Take 2 tablets (650 mg total) by mouth every 4 (four) hours as needed for mild pain (or temp > 37.5 C (99.5 F)).  Marland Kitchen aspirin 325 MG tablet Take 1 tablet (325 mg total) by mouth daily.  Marland Kitchen atorvastatin (LIPITOR) 80 MG tablet Take 80 mg by mouth daily.   . bictegravir-emtricitabine-tenofovir AF (BIKTARVY) 50-200-25 MG TABS tablet Take 1 tablet by mouth daily.  . carvedilol (COREG) 3.125 MG tablet Take 1 tablet (3.125 mg total) by mouth 2 (two) times daily with a meal.  . Cholecalciferol (VITAMIN D3) 2000 UNITS TABS Take 2,000 mg by mouth daily.  . coal tar (NEUTROGENA T-GEL) 0.5 % shampoo Apply to scalp on shower days (Twice a week) for dandruff and dry scalp Wed., and Sat.  . digoxin (LANOXIN) 0.125 MG tablet TAKE 1/2 TABLET BY MOUTH DAILY  . docusate sodium (COLACE) 100 MG capsule Take 100 mg by mouth daily.  . isosorbide mononitrate (IMDUR) 30 MG 24 hr tablet Take 30 mg by mouth daily.   . Multiple Vitamin (MULTIVITAMIN WITH MINERALS) TABS tablet Take 1 tablet by mouth daily.  . ranitidine (ZANTAC) 150 MG  tablet Take 150 mg by mouth at bedtime.  . senna-docusate (SENEXON-S) 8.6-50 MG tablet Take 1 tablet by mouth daily.  . sitaGLIPtin (JANUVIA) 50 MG tablet Take 50 mg by mouth daily.  . tamsulosin (FLOMAX) 0.4 MG CAPS capsule Take 0.4 mg by mouth daily.     . digoxin  0.125 mg Intravenous Daily  . enoxaparin (LOVENOX) injection  30 mg Subcutaneous Q24H  . insulin aspart  0-9 Units Subcutaneous Q4H       LABS:  CBC Latest Ref Rng & Units 07/07/2018 07/07/2018 07/06/2018  WBC 4.0 - 10.5 K/uL 19.7(H) 10.3 1.9(L)  Hemoglobin 13.0 - 17.0 g/dL 12.4(L) 13.1 15.2  Hematocrit 39.0 - 52.0 % 42.4 43.1 48.8  Platelets  150 - 400 K/uL 80(L) 87(L) 107(L)    CMP Latest Ref Rng & Units 07/07/2018 07/07/2018 07/06/2018  Glucose 70 - 99 mg/dL 94 87 136(H)  BUN 8 - 23 mg/dL 54(H) 47(H) 43(H)  Creatinine 0.61 - 1.24 mg/dL 4.24(H) 3.72(H) 3.01(H)  Sodium 135 - 145 mmol/L 139 139 136  Potassium 3.5 - 5.1 mmol/L 5.5(H) 5.6(H) 5.9(H)  Chloride 98 - 111 mmol/L 109 106 105  CO2 22 - 32 mmol/L 18(L) 20(L) 20(L)  Calcium 8.9 - 10.3 mg/dL 8.0(L) 7.9(L) 8.8(L)  Total Protein 6.5 - 8.1 g/dL 6.1(L) 6.2(L) 7.1  Total Bilirubin 0.3 - 1.2 mg/dL 1.1 1.0 0.9  Alkaline Phos 38 - 126 U/L 84 93 102  AST 15 - 41 U/L 161(H) 320(H) 70(H)  ALT 0 - 44 U/L 165(H) 216(H) 46(H)    Lab Results  Component Value Date   PTH 52 04/17/2018   CALCIUM 8.0 (L) 07/07/2018   CAION 1.27 (H) 11/28/2015   PHOS 3.4 04/17/2018       Component Value Date/Time   COLORURINE AMBER (A) 12/09/2016 1008   APPEARANCEUR TURBID (A) 12/09/2016 1008   LABSPEC 1.013 12/09/2016 1008   PHURINE 6.0 12/09/2016 1008   GLUCOSEU NEGATIVE 12/09/2016 1008   HGBUR LARGE (A) 12/09/2016 1008   BILIRUBINUR NEGATIVE 12/09/2016 1008   KETONESUR NEGATIVE 12/09/2016 1008   PROTEINUR 100 (A) 12/09/2016 1008   UROBILINOGEN 0.2 10/23/2013 1545   NITRITE NEGATIVE 12/09/2016 1008   LEUKOCYTESUR MODERATE (A) 12/09/2016 1008      Component Value Date/Time   HCO3 23.7  02/06/2014 0959   TCO2 25 11/28/2015 1304   ACIDBASEDEF 1.0 02/06/2014 0959   O2SAT 66.0 02/06/2014 0959       Component Value Date/Time   IRON 40 (L) 04/17/2018 0734   TIBC 260 04/17/2018 0734   FERRITIN 39 04/17/2018 0734   IRONPCTSAT 15 (L) 04/17/2018 0734       ASSESSMENT/PLAN:     Problem List Items Addressed This Visit    None    Visit Diagnoses    Sepsis with encephalopathy and septic shock, due to unspecified organism ([place])    -  Primary   Relevant Medications   ceFEPIme (MAXIPIME) 2 g in sodium chloride 0.9 % 100 mL IVPB (Completed)   vancomycin (VANCOCIN) IVPB 1000 mg/200 mL premix (Completed)   cefTRIAXone (ROCEPHIN) 2 g in sodium chloride 0.9 % 100 mL IVPB   Tachycardia       Relevant Orders   DG Chest 1 View (Completed)   Acute kidney injury ([place])       Relevant Orders   US RENAL      1.  Chronic kidney disease stage IV with a baseline serum creatinine around 2.4.  I suspect his chronic kidney disease on the basis of longstanding hypertension, diabetes, atherosclerotic cardiovascular disease, and possibly HIV.  2.  Acute kidney injury.  I suspect this is on the basis of sepsis and decreased renal perfusion.  His urine output is minimal.  We will continue to follow him closely.  If he does not have urine output in the next 24 hours, will need to initiate dialysis.  Fluid bolus administered.  May need closer monitoring in the ICU or stepdown.  Discussed with hospitalist.  3.  Sepsis.  He has received appropriate fluid bolus.  Continue antibiotics.  Cultures are growing E. coli in the urine.  Trend lactate  4.  Metabolic encephalopathy.  Likely secondary to sepsis.  Monitor mental status closely.  5.  Hyperkalemia.  This is mild and improving.    [place], DO, [place]

## 2018-07-07 NOTE — Progress Notes (Signed)
PHARMACY - PHYSICIAN COMMUNICATION CRITICAL VALUE ALERT - BLOOD CULTURE IDENTIFICATION (BCID)  Keith Young is an 68 y.o. male who presented to [facility] on 07/06/2018 with a chief complaint of decreased responsiveness and fevers.   Assessment: CXR neg. Urine was malodorous per MD but not making much urine so unable to do UA/UCx. Now 1/4 BCx with GNRs. BCID shows E.coli with no resistance patterns. Suspect urine could be source   Name of physician (or Provider) Contacted: Landis Gandy   Current antibiotics: Cefepime/Vancomycin  Changes to prescribed antibiotics recommended: Stop Vanc/cefepime and start ceftriaxone 2 gm IV Q 24 hours  Recommendations accepted by provider  Results for orders placed or performed during the hospital encounter of 07/06/18  Blood Culture ID Panel (Reflexed) (Collected: 07/06/2018  7:40 PM)  Result Value Ref Range   Enterococcus species NOT DETECTED NOT DETECTED   Listeria monocytogenes NOT DETECTED NOT DETECTED   Staphylococcus species NOT DETECTED NOT DETECTED   Staphylococcus aureus (BCID) NOT DETECTED NOT DETECTED   Streptococcus species NOT DETECTED NOT DETECTED   Streptococcus agalactiae NOT DETECTED NOT DETECTED   Streptococcus pneumoniae NOT DETECTED NOT DETECTED   Streptococcus pyogenes NOT DETECTED NOT DETECTED   Acinetobacter baumannii NOT DETECTED NOT DETECTED   Enterobacteriaceae species DETECTED (A) NOT DETECTED   Enterobacter cloacae complex NOT DETECTED NOT DETECTED   Escherichia coli DETECTED (A) NOT DETECTED   Klebsiella oxytoca NOT DETECTED NOT DETECTED   Klebsiella pneumoniae NOT DETECTED NOT DETECTED   Proteus species NOT DETECTED NOT DETECTED   Serratia marcescens NOT DETECTED NOT DETECTED   Carbapenem resistance NOT DETECTED NOT DETECTED   Haemophilus influenzae NOT DETECTED NOT DETECTED   Neisseria meningitidis NOT DETECTED NOT DETECTED   Pseudomonas aeruginosa NOT DETECTED NOT DETECTED   Candida albicans NOT DETECTED NOT  DETECTED   Candida glabrata NOT DETECTED NOT DETECTED   Candida krusei NOT DETECTED NOT DETECTED   Candida parapsilosis NOT DETECTED NOT DETECTED   Candida tropicalis NOT DETECTED NOT DETECTED    Albertina Parr, PharmD., BCPS Clinical Pharmacist Clinical phone for 07/07/18 until 8:30pm: [phone] If after 8:30pm, please refer to [facility] for unit-specific pharmacist

## 2018-07-07 NOTE — H&P (Signed)
History and Physical    DAKARAI MCGLOCKLIN WJX:[id number] DOB: 04/24/1951 DOA: 07/06/2018  PCP: Virgie Dad, MD  Patient coming from: SNF  I have personally briefly reviewed patient's old medical records in [place]  Chief Complaint: AMS  HPI: Keith Young is a 68 y.o. male with medical history significant of systolic CHF ejection fraction 30% as well as chronic kidney disease followed by nephrology also a history of AICD placement followed by cardiology.  He also has a history of HIV followed by infectious disease-as well as type 2 diabetes, COPD, hypertension, and a history of CVA.  He is normally interactive and talkative; however, this evening he had an acute change where he vomited once became lethargic with decreased responsiveness.  He was found to be significantly warm to touch his temperature was taken axillary and it was in excess of 102 degrees.  He was also hypotensive with a blood pressure in the 21H systolically.  Oxygen saturation was in the mid 90s on room air.  Patient was sent in to the ED emergently.   ED Course: In the ED patient initially unresponsive; however, he was not intubated secondary to his code status being DNR.  He has remained hypotensive in the 80s despite 30cc/kg IVF bolus, his mental status has improved however and he is now able to wake up and answer some questions:  He denies pain anywhere, denies headache, abd pain, SOB.  CXR neg.  Reportedly had malodorous urine though hasnt produced urine since arrival so no UA yet.  WBC 1.9k, creat 3.0 up from 2.5 baseline.   Review of Systems: As per HPI otherwise 10 point review of systems negative.   Past Medical History:  Diagnosis Date  . AICD (automatic cardioverter/defibrillator) present   . Alcoholism in remission ([place])   . Anemia   . Aortic insufficiency   . Arthritis   . At moderate risk for fall   . Bell's palsy   . Cardiomyopathy ([place])   . Chronic systolic heart failure (HCC)     NYHA Class III  . CKD (chronic kidney disease) stage 3, GFR 30-59 ml/min (HCC)   . COPD (chronic obstructive pulmonary disease) ([place])   . Essential hypertension, benign   . HIV disease ([place]) 09/06/2016  . Hyperlipidemia   . Noncompliance with medication regimen   . Nonischemic cardiomyopathy (HCC)    EF 15-20%  . NSVT (nonsustained ventricular tachycardia) ([place])   . Numbness of right jaw    Had a stoke in 01/2013. Numbness is occasional, especially when trying to chew.  . Productive cough 08/2013   With brown sputum   . SOB (shortness of breath) on exertion 08/2013  . Stroke ([place]) 01/2013   weakness of right side from CVA  . Type 2 diabetes mellitus ([place])   . Uses hearing aid 2014   recently received new hearing aids    Past Surgical History:  Procedure Laterality Date  . BIOPSY N/A 04/10/2014   Procedure: GASTRIC BIOPSY;  Surgeon: Daneil Dolin, MD;  Location: [facility] ORS;  Service: Endoscopy;  Laterality: N/A;  . BIOPSY  10/12/2015   Procedure: BIOPSY;  Surgeon: Daneil Dolin, MD;  Location: [facility] ENDO SUITE;  Service: Endoscopy;;  stomach bx's  . CARDIAC DEFIBRILLATOR PLACEMENT  11/12/12   [employer] Inogen MINI ICD implanted in [place] at [place] [facility] per Dr Nelly Laurence note  . COLONOSCOPY WITH PROPOFOL N/A 04/10/2014  RMR: Colonic Diverticulosis  . ESOPHAGOGASTRODUODENOSCOPY (EGD) WITH PROPOFOL N/A 04/10/2014   RMR: Mild erosive reflux esophagitis. Multiple antral polyps likely hyperplastic status post removal by hot snare cautery technique. Diffusely abnormal stomach status post gastric biopsy I suspect some of patients anemaia may be due to intermittent oozing from the stomach. It would be difficult and a risky proposition to attempt complete removal of all of his gastric polyps.  . ESOPHAGOGASTRODUODENOSCOPY (EGD) WITH PROPOFOL N/A 10/12/2015   Procedure: ESOPHAGOGASTRODUODENOSCOPY (EGD) WITH PROPOFOL;  Surgeon: Daneil Dolin, MD;  Location: [facility];  Service: Endoscopy;  Laterality: N/A;  4268 - moved to 9:15  . POLYPECTOMY N/A 04/10/2014   Procedure: GASTRIC POLYPECTOMY;  Surgeon: Daneil Dolin, MD;  Location: [facility] ORS;  Service: Endoscopy;  Laterality: N/A;  . RIGHT HEART CATHETERIZATION N/A 02/06/2014   Procedure: RIGHT HEART CATH;  Surgeon: Larey Dresser, MD;  Location: [facility] CATH LAB;  Service: Cardiovascular;  Laterality: N/A;     reports that he quit smoking about 24 years ago. His smoking use included cigarettes. He has never used smokeless tobacco. He reports previous alcohol use. He reports that he does not use drugs.  No Known Allergies  Family History  Problem Relation Age of Onset  . Kidney disease Mother   . Kidney disease Sister   . Colon cancer Neg Hx      Prior to Admission medications   Medication Sig Start Date End Date Taking? Authorizing Provider  acetaminophen (TYLENOL) 325 MG tablet Take 2 tablets (650 mg total) by mouth every 4 (four) hours as needed for mild pain (or temp > 37.5 C (99.5 F)). 06/30/16  Yes Sinda Du, MD  aspirin 325 MG tablet Take 1 tablet (325 mg total) by mouth daily. 06/30/16  Yes Sinda Du, MD  atorvastatin (LIPITOR) 80 MG tablet Take 80 mg by mouth daily.  11/22/16  Yes [provider]  bictegravir-emtricitabine-tenofovir AF (BIKTARVY) 50-200-25 MG TABS tablet Take 1 tablet by mouth daily. 04/18/18  Yes Comer, Okey Regal, MD  carvedilol (COREG) 3.125 MG tablet Take 1 tablet (3.125 mg total) by mouth 2 (two) times daily with a meal. 09/12/16  Yes Sinda Du, MD  Cholecalciferol (VITAMIN D3) 2000 UNITS TABS Take 2,000 mg by mouth daily.   Yes [provider]  coal tar (NEUTROGENA T-GEL) 0.5 % shampoo Apply to scalp on shower days (Twice a week) for dandruff and dry scalp Wed., and Sat.   Yes [provider]  digoxin (LANOXIN) 0.125 MG tablet TAKE 1/2 TABLET BY MOUTH DAILY 07/01/16  Yes Bensimhon, Shaune Pascal, MD  docusate  sodium (COLACE) 100 MG capsule Take 100 mg by mouth daily.   Yes [provider]  isosorbide mononitrate (IMDUR) 30 MG 24 hr tablet Take 30 mg by mouth daily.  01/17/18  Yes [provider]  Multiple Vitamin (MULTIVITAMIN WITH MINERALS) TABS tablet Take 1 tablet by mouth daily. 06/30/16  Yes Sinda Du, MD  ranitidine (ZANTAC) 150 MG tablet Take 150 mg by mouth at bedtime.   Yes [provider]  senna-docusate (SENEXON-S) 8.6-50 MG tablet Take 1 tablet by mouth daily.   Yes [provider]  sitaGLIPtin (JANUVIA) 50 MG tablet Take 50 mg by mouth daily.   Yes [provider]  tamsulosin (FLOMAX) 0.4 MG CAPS capsule Take 0.4 mg by mouth daily.   Yes [provider]    Physical Exam: Vitals:   07/06/18 2330 07/06/18 2345 07/07/18 0000 07/07/18 0006  BP:  95/60 (!) 82/59 (!) 77/62 (!) 85/60  Pulse:  99 99   Resp:  (!) 21 (!) 22   Temp:      TempSrc:      SpO2:  92% 92%   Weight:      Height:        Constitutional: NAD, calm, comfortable Eyes: PERRL, lids and conjunctivae normal ENMT: Mucous membranes are moist. Posterior pharynx clear of any exudate or lesions.Normal dentition.  Neck: normal, supple, no masses, no thyromegaly Respiratory: clear to auscultation bilaterally, no wheezing, no crackles. Normal respiratory effort. No accessory muscle use.  Cardiovascular: Regular rate and rhythm, no murmurs / rubs / gallops. No extremity edema. 2+ pedal pulses. No carotid bruits.  Abdomen: no tenderness, no masses palpated. No hepatosplenomegaly. Bowel sounds positive.  Musculoskeletal: no clubbing / cyanosis. No joint deformity upper and lower extremities. Good ROM, no contractures. Normal muscle tone.  Skin: no rashes, lesions, ulcers. No induration Neurologic: Lethargic but able to wake up and answer questions at this point.   Labs on Admission: I have personally reviewed following labs and imaging studies  CBC: Recent Labs  Lab  07/04/18 0700 07/06/18 1908  WBC 3.7* 1.9*  NEUTROABS 2.2 1.7  HGB 14.0 15.2  HCT 45.6 48.8  MCV 91.8 91.2  PLT 124* 681*   Basic Metabolic Panel: Recent Labs  Lab 07/04/18 0700 07/06/18 1908  NA 139 136  K 4.7 5.9*  CL 109 105  CO2 23 20*  GLUCOSE 100* 136*  BUN 45* 43*  CREATININE 2.44* 3.01*  CALCIUM 8.9 8.8*   GFR: Estimated Creatinine Clearance: 21.8 mL/min (A) (by C-G formula based on SCr of 3.01 mg/dL (H)). Liver Function Tests: Recent Labs  Lab 07/06/18 1908  AST 70*  ALT 46*  ALKPHOS 102  BILITOT 0.9  PROT 7.1  ALBUMIN 3.0*   No results for input(s): LIPASE, AMYLASE in the last 168 hours. No results for input(s): AMMONIA in the last 168 hours. Coagulation Profile: No results for input(s): INR, PROTIME in the last 168 hours. Cardiac Enzymes: No results for input(s): CKTOTAL, CKMB, CKMBINDEX, TROPONINI in the last 168 hours. BNP (last 3 results) No results for input(s): PROBNP in the last 8760 hours. HbA1C: No results for input(s): HGBA1C in the last 72 hours. CBG: No results for input(s): GLUCAP in the last 168 hours. Lipid Profile: No results for input(s): CHOL, HDL, LDLCALC, TRIG, CHOLHDL, LDLDIRECT in the last 72 hours. Thyroid Function Tests: No results for input(s): TSH, T4TOTAL, FREET4, T3FREE, THYROIDAB in the last 72 hours. Anemia Panel: No results for input(s): VITAMINB12, FOLATE, FERRITIN, TIBC, IRON, RETICCTPCT in the last 72 hours. Urine analysis:    Component Value Date/Time   COLORURINE AMBER (A) 12/09/2016 1008   APPEARANCEUR TURBID (A) 12/09/2016 1008   LABSPEC 1.013 12/09/2016 1008   PHURINE 6.0 12/09/2016 1008   GLUCOSEU NEGATIVE 12/09/2016 1008   HGBUR LARGE (A) 12/09/2016 1008   BILIRUBINUR NEGATIVE 12/09/2016 1008   KETONESUR NEGATIVE 12/09/2016 1008   PROTEINUR 100 (A) 12/09/2016 1008   UROBILINOGEN 0.2 10/23/2013 1545   NITRITE NEGATIVE 12/09/2016 1008   LEUKOCYTESUR MODERATE (A) 12/09/2016 1008    Radiological  Exams on Admission: Ct Head Wo Contrast  Result Date: 07/06/2018 CLINICAL DATA:  Altered mental status. History of HIV, hypertension, hypercholesterolemia and diabetes. EXAM: CT HEAD WITHOUT CONTRAST TECHNIQUE: Contiguous axial images were obtained from the base of the skull through the vertex without intravenous contrast. COMPARISON:  CT HEAD July 21, 2016 FINDINGS: BRAIN:  No intraparenchymal hemorrhage, mass effect, midline shift or acute large vascular territory infarcts. Old small RIGHT cerebellar and pontine infarcts. RIGHT temporal occipital encephalomalacia. New subcentimeter RIGHT internal capsule versus basal ganglia hypodensities. Old RIGHT basal ganglia infarct with mild ex vacuo dilatation RIGHT lateral ventricle. Old small bilateral thalami and LEFT basal ganglia infarcts. Confluent supratentorial white matter hypodensities. Moderate parenchymal brain volume loss. No hydrocephalus. VASCULAR: Moderate calcific atherosclerosis of the carotid siphons. SKULL: No skull fracture. No significant scalp soft tissue swelling. SINUSES/ORBITS: Mild paranasal sinus mucosal thickening. Chronic dehiscence of the RIGHT posterolateral maxillary wall. Mastoid air cells are well aerated.The included ocular globes and orbital contents are non-suspicious. OTHER: Bilateral parotid sialoliths. IMPRESSION: 1. New age indeterminate RIGHT basal ganglia small infarcts. 2. Old RIGHT temporal occipital/PCA territory infarct. 3. Old basal ganglia, thalami, pontine and cerebellar small infarcts. Moderate to severe chronic small vessel ischemic changes. 4. Moderate parenchymal brain volume loss, advanced for age. Electronically Signed   By: Elon Alas M.D.   On: 07/06/2018 22:16   Dg Chest Port 1 View  Result Date: 07/06/2018 CLINICAL DATA:  Sepsis EXAM: PORTABLE CHEST 1 VIEW COMPARISON:  Portable exam 2023 hours compared to 12/09/2016 FINDINGS: LEFT subclavian AICD with lead projecting over RIGHT ventricle. External  pacing lead present. Enlargement of cardiac silhouette with postsurgical changes of CABG. Atherosclerotic calcification aorta. Mediastinal contours normal. Subsegmental atelectasis LEFT lower lobe. Pulmonary vascular congestion with interstitial prominence at the mid to lower lungs which could represent minimal pulmonary edema. No pleural effusion or pneumothorax. Bones demineralized. IMPRESSION: Enlargement of cardiac silhouette with vascular congestion and question minimal pulmonary edema. Electronically Signed   By: Lavonia Dana M.D.   On: 07/06/2018 20:35    EKG: Independently reviewed.  Assessment/Plan Principal Problem:   Septic shock (HCC) Active Problems:   Chronic kidney disease (CKD), active medical management without dialysis, stage 3 (moderate) (HCC)   Diabetes mellitus type 2 in nonobese [facility])   Essential hypertension, benign   ICD (implantable cardioverter-defibrillator) in place   HIV disease ([place])   Chronic combined systolic and diastolic CHF (congestive heart failure) ([place])   Acute metabolic encephalopathy    1. Septic shock - unk source 1. CXR neg. 2. Reportedly had malodorous urine though hasnt produced urine since arrival so no UA yet. 3. 30 cc/kg bolus going, getting 2.5L bolus total then NS at 125 cc/hr 4. First lactate 2.5, repeat ordered and pending 5. BCx, UCx, UA pending 6. Empiric cefepime and vanc 7. Not intubated nor put on pressors secondary to DNR status 8. Tele monitor: tachycardia resolved for the moment 9. Tylenol PRN fever 10. Repeat CBC in AM 2. CKD stage 3-4: with mild AKI 1. Baseline creat 2.5, 3.0 today 2. IVF as above 3. Strict intake and output 4. UA pending 5. Repeat BMP in AM to keep eye on potassium, K 5.9 initially, treating with NS as IVF 6. Presumably secondary to hypotension from septic shock above 3. Acute encephalopathy - 1. Secondary to #1 above 4. HTN - hold home BP meds 5. HIV disease - 1. continue HAART 2. CD4 count known  to be above 200 6. DM2 - sensitive SSI q4h 7. Chronic combined CHF - 1. Watch for s/sx of fluid overload with IVF resusitation  DVT prophylaxis: Lovenox Code Status: DNR Family Communication: No family present in room Disposition Plan: SNF after admit Consults called: None Admission status: Admit to inpatient  Severity of Illness: The appropriate patient status for this patient is INPATIENT. Inpatient status  is judged to be reasonable and necessary in order to provide the required intensity of service to ensure the patient's safety. The patient's presenting symptoms, physical exam findings, and initial radiographic and laboratory data in the context of their chronic comorbidities is felt to place them at high risk for further clinical deterioration. Furthermore, it is not anticipated that the patient will be medically stable for discharge from the hospital within 2 midnights of admission. The following factors support the patient status of inpatient.   " The patient's presenting symptoms include unresponsive, fever. " The worrisome physical exam findings include Hypotension, fever, AMS. " The initial radiographic and laboratory data are worrisome because of WBC 1.9k. " The chronic co-morbidities include CHF, prior CVA, HIV.   * I certify that at the point of admission it is my clinical judgment that the patient will require inpatient hospital care spanning beyond 2 midnights from the point of admission due to high intensity of service, high risk for further deterioration and high frequency of surveillance required.*    ,  M. DO [facility]  How to contact the [facility] Attending or Consulting provider [place] or covering provider during after hours [place], for this patient?  1. Check the care team in [facility] and look for a) attending/consulting TR[facility] provider listed and b) the [facility] A team listed 2. Log into [email or web address]  Amion Physician Scheduling and messaging for groups and  whole hospitals  On call and physician scheduling software for group practices, residents, hospitalists and other medical providers for call, clinic, rotation and shift schedules. OnCall [employer] is a hospital-wide system for scheduling doctors and paging doctors on call. EasyPlot is for scientific plotting and data analysis.  [email or web address]  and use [place]'s universal password to access. If you do not have the password, please contact the hospital operator.  3. Locate the [facility] provider you are looking for under [facility] and page to a number that you can be directly reached. 4. If you still have difficulty reaching the provider, please page the [facility] (Director on Call) for the Hospitalists listed on amion for assistance.  07/07/2018, 1:15 AM

## 2018-07-07 NOTE — Plan of Care (Signed)
68 yo male admitted this am by my partner from SNF with AMS.thought to be septic source may be urine.flu panel negative.has ckd.continue vanc and cefepime.check stat labs now with lactic acid.

## 2018-07-07 NOTE — ED Notes (Signed)
Pt CBG 83, Notified Luellen Pucker, Therapist, sports.

## 2018-07-07 NOTE — Progress Notes (Signed)
CRITICAL VALUE ALERT  Critical Value:  Lactic Acid 3.0  Date & Time Notied:  07/07/2018; 17:34  Provider Notified: Dr. Rodena Piety  Orders Received/Actions taken:

## 2018-07-07 NOTE — Progress Notes (Signed)
Patient back from US Renal. BP is still low 82/59(66) and HR 104, Temp 99.9  after NS 250 ml x 2 bolus. MD made aware.

## 2018-07-07 NOTE — ED Notes (Signed)
Pt has condom cath in place.

## 2018-07-08 ENCOUNTER — Inpatient Hospital Stay (HOSPITAL_COMMUNITY): Payer: Medicare HMO

## 2018-07-08 DIAGNOSIS — R4182 Altered mental status, unspecified: Secondary | ICD-10-CM

## 2018-07-08 DIAGNOSIS — G253 Myoclonus: Secondary | ICD-10-CM

## 2018-07-08 LAB — URINALYSIS, ROUTINE W REFLEX MICROSCOPIC
Bilirubin Urine: NEGATIVE
Glucose, UA: NEGATIVE mg/dL
Ketones, ur: 5 mg/dL — AB
Nitrite: NEGATIVE
Protein, ur: 100 mg/dL — AB
Specific Gravity, Urine: 1.016 (ref 1.005–1.030)
WBC, UA: >50 WBC/hpf — ABNORMAL HIGH (ref 0–5)
pH: 5 (ref 5.0–8.0)

## 2018-07-08 LAB — COMPREHENSIVE METABOLIC PANEL
ALT: 117 U/L — ABNORMAL HIGH (ref 0–44)
AST: 80 U/L — ABNORMAL HIGH (ref 15–41)
Albumin: 2.3 g/dL — ABNORMAL LOW (ref 3.5–5.0)
Alkaline Phosphatase: 78 U/L (ref 38–126)
Anion gap: 13 (ref 5–15)
BILIRUBIN TOTAL: 0.9 mg/dL (ref 0.3–1.2)
BUN: 74 mg/dL — ABNORMAL HIGH (ref 8–23)
CO2: 16 mmol/L — ABNORMAL LOW (ref 22–32)
Calcium: 7.5 mg/dL — ABNORMAL LOW (ref 8.9–10.3)
Chloride: 110 mmol/L (ref 98–111)
Creatinine, Ser: 5.32 mg/dL — ABNORMAL HIGH (ref 0.61–1.24)
GFR calc Af Amer: 12 mL/min — ABNORMAL LOW (ref >60)
GFR calc non Af Amer: 10 mL/min — ABNORMAL LOW (ref >60)
Glucose, Bld: 108 mg/dL — ABNORMAL HIGH (ref 70–99)
Potassium: 5.5 mmol/L — ABNORMAL HIGH (ref 3.5–5.1)
Sodium: 139 mmol/L (ref 135–145)
TOTAL PROTEIN: 6.1 g/dL — AB (ref 6.5–8.1)

## 2018-07-08 LAB — CBC
HCT: 41.1 % (ref 39.0–52.0)
Hemoglobin: 13.1 g/dL (ref 13.0–17.0)
MCH: 29.7 pg (ref 26.0–34.0)
MCHC: 31.9 g/dL (ref 30.0–36.0)
MCV: 93.2 fL (ref 80.0–100.0)
Platelets: 73 10*3/uL — ABNORMAL LOW (ref 150–400)
RBC: 4.41 MIL/uL (ref 4.22–5.81)
RDW: 14.7 % (ref 11.5–15.5)
WBC: 13.8 10*3/uL — ABNORMAL HIGH (ref 4.0–10.5)
nRBC: 0 % (ref 0.0–0.2)

## 2018-07-08 LAB — BLOOD GAS, ARTERIAL
Acid-base deficit: 7.5 mmol/L — ABNORMAL HIGH (ref 0.0–2.0)
Bicarbonate: 17 mmol/L — ABNORMAL LOW (ref 20.0–28.0)
Drawn by: 331001
O2 Saturation: 95.9 %
Patient temperature: 99.1
pCO2 arterial: 32 mmHg (ref 32.0–48.0)
pH, Arterial: 7.346 — ABNORMAL LOW (ref 7.350–7.450)
pO2, Arterial: 86.4 mmHg (ref 83.0–108.0)

## 2018-07-08 LAB — GLUCOSE, CAPILLARY
Glucose-Capillary: 111 mg/dL — ABNORMAL HIGH (ref 70–99)
Glucose-Capillary: 125 mg/dL — ABNORMAL HIGH (ref 70–99)
Glucose-Capillary: 84 mg/dL (ref 70–99)
Glucose-Capillary: 93 mg/dL (ref 70–99)
Glucose-Capillary: 95 mg/dL (ref 70–99)
Glucose-Capillary: 99 mg/dL (ref 70–99)
Glucose-Capillary: 99 mg/dL (ref 70–99)

## 2018-07-08 LAB — BASIC METABOLIC PANEL
Anion gap: 11 (ref 5–15)
BUN: 78 mg/dL — ABNORMAL HIGH (ref 8–23)
CO2: 17 mmol/L — ABNORMAL LOW (ref 22–32)
Calcium: 7.4 mg/dL — ABNORMAL LOW (ref 8.9–10.3)
Chloride: 113 mmol/L — ABNORMAL HIGH (ref 98–111)
Creatinine, Ser: 5.12 mg/dL — ABNORMAL HIGH (ref 0.61–1.24)
GFR calc Af Amer: 13 mL/min — ABNORMAL LOW (ref >60)
GFR calc non Af Amer: 11 mL/min — ABNORMAL LOW (ref >60)
Glucose, Bld: 119 mg/dL — ABNORMAL HIGH (ref 70–99)
Potassium: 5.1 mmol/L (ref 3.5–5.1)
Sodium: 141 mmol/L (ref 135–145)

## 2018-07-08 LAB — AMMONIA: Ammonia: 33 umol/L (ref 9–35)

## 2018-07-08 LAB — DIGOXIN LEVEL: Digoxin Level: 0.5 ng/mL — ABNORMAL LOW (ref 0.8–2.0)

## 2018-07-08 LAB — PROCALCITONIN: Procalcitonin: >150 ng/mL

## 2018-07-08 LAB — LACTIC ACID, PLASMA
Lactic Acid, Venous: 1.6 mmol/L (ref 0.5–1.9)
Lactic Acid, Venous: 1.5 mmol/L (ref 0.5–1.9)

## 2018-07-08 MED ORDER — DEXTROSE 5 % IV SOLN
10.0000 mg/kg | INTRAVENOUS | Status: DC
  Filled 2018-07-08: qty 14.9

## 2018-07-08 MED ORDER — METOPROLOL TARTRATE 5 MG/5ML IV SOLN: 5.0000 mg | Freq: Four times a day (QID) | INTRAVENOUS | Status: DC | PRN

## 2018-07-08 MED ORDER — SODIUM BICARBONATE 8.4 % IV SOLN
INTRAVENOUS | Status: AC
  Administered 2018-07-08 – 2018-07-09 (×2): via INTRAVENOUS
  Filled 2018-07-08 (×2): qty 150

## 2018-07-08 MED ORDER — DILTIAZEM LOAD VIA INFUSION
10.0000 mg | Freq: Once | INTRAVENOUS | Status: AC
  Administered 2018-07-08: 10 mg via INTRAVENOUS
  Filled 2018-07-08: qty 10

## 2018-07-08 MED ORDER — DEXTROSE 5 % IV SOLN
10.0000 mg/kg | Freq: Once | INTRAVENOUS | Status: AC
  Administered 2018-07-08: 745 mg via INTRAVENOUS
  Filled 2018-07-08: qty 14.9

## 2018-07-08 MED ORDER — SODIUM CHLORIDE 0.9 % IV SOLN
2.0000 g | Freq: Two times a day (BID) | INTRAVENOUS | Status: DC
  Administered 2018-07-08 (×2): 2 g via INTRAVENOUS
  Filled 2018-07-08 (×3): qty 2000

## 2018-07-08 MED ORDER — LEVETIRACETAM IN NACL 500 MG/100ML IV SOLN
500.0000 mg | Freq: Two times a day (BID) | INTRAVENOUS | Status: DC
  Administered 2018-07-09 – 2018-07-12 (×8): 500 mg via INTRAVENOUS
  Filled 2018-07-08 (×8): qty 100

## 2018-07-08 MED ORDER — METOPROLOL TARTRATE 5 MG/5ML IV SOLN
INTRAVENOUS | Status: AC
  Filled 2018-07-08: qty 5

## 2018-07-08 MED ORDER — DILTIAZEM HCL-DEXTROSE 100-5 MG/100ML-% IV SOLN (PREMIX)
5.0000 mg/h | INTRAVENOUS | Status: DC
  Administered 2018-07-08: 5 mg/h via INTRAVENOUS
  Administered 2018-07-09: 15 mg/h via INTRAVENOUS
  Filled 2018-07-08 (×8): qty 100

## 2018-07-08 MED ORDER — VANCOMYCIN HCL IN DEXTROSE 1-5 GM/200ML-% IV SOLN: 1000.0000 mg | INTRAVENOUS | Status: DC

## 2018-07-08 MED ORDER — LEVETIRACETAM IN NACL 1500 MG/100ML IV SOLN
1500.0000 mg | Freq: Once | INTRAVENOUS | Status: AC
  Administered 2018-07-08: 1500 mg via INTRAVENOUS
  Filled 2018-07-08: qty 100

## 2018-07-08 MED ORDER — VANCOMYCIN HCL 10 G IV SOLR
1500.0000 mg | Freq: Once | INTRAVENOUS | Status: AC
  Administered 2018-07-08: 1500 mg via INTRAVENOUS
  Filled 2018-07-08: qty 1500

## 2018-07-08 MED ORDER — METOPROLOL TARTRATE 5 MG/5ML IV SOLN
5.0000 mg | Freq: Once | INTRAVENOUS | Status: AC
  Administered 2018-07-08: 5 mg via INTRAVENOUS

## 2018-07-08 MED ORDER — SODIUM CHLORIDE 0.9 % IV SOLN
2.0000 g | Freq: Two times a day (BID) | INTRAVENOUS | Status: DC
  Administered 2018-07-08 – 2018-07-09 (×2): 2 g via INTRAVENOUS
  Filled 2018-07-08 (×2): qty 20

## 2018-07-08 NOTE — Progress Notes (Signed)
EEG completed, results pending. 

## 2018-07-08 NOTE — Progress Notes (Signed)
[facility]    NEPHROLOGY PROGRESS NOTE  SUBJECTIVE: Blood pressure improved last night.  Patient remains lethargic and unable to provide any review of systems.     OBJECTIVE:  Vitals:   07/08/18 1626 07/08/18 1628  BP:    Pulse:    Resp: (!) 24 (!) 24  Temp:    SpO2:  95%    Intake/Output Summary (Last 24 hours) at 07/08/2018 1703 Last data filed at 07/08/2018 1506 Gross per 24 hour  Intake 450 ml  Output 200 ml  Net 250 ml      [place]: Lethargic, does not follow commands, jerking movements noted to the face and right arm HEENT: MMM [place] AT anicteric sclera Neck:  No JVD, no adenopathy CV:  Heart RRR  Lungs:  L/S CTA bilaterally Abd:  abd SNT/ND with normal BS GU:  Bladder non-palpable, Foley catheter in place Extremities:  No LE edema. Skin:  No skin rash  MEDICATIONS:  . digoxin  0.125 mg Intravenous Daily  . enoxaparin (LOVENOX) injection  30 mg Subcutaneous Q24H  . insulin aspart  0-9 Units Subcutaneous Q4H  . metoprolol tartrate           LABS:   CBC Latest Ref Rng & Units 07/08/2018 07/07/2018 07/07/2018  WBC 4.0 - 10.5 K/uL 13.8(H) 19.7(H) 10.3  Hemoglobin 13.0 - 17.0 g/dL 13.1 12.4(L) 13.1  Hematocrit 39.0 - 52.0 % 41.1 42.4 43.1  Platelets 150 - 400 K/uL 73(L) 80(L) 87(L)    CMP Latest Ref Rng & Units 07/08/2018 07/08/2018 07/07/2018  Glucose 70 - 99 mg/dL 119(H) 108(H) 94  BUN 8 - 23 mg/dL 78(H) 74(H) 54(H)  Creatinine 0.61 - 1.24 mg/dL 5.12(H) 5.32(H) 4.24(H)  Sodium 135 - 145 mmol/L 141 139 139  Potassium 3.5 - 5.1 mmol/L 5.1 5.5(H) 5.5(H)  Chloride 98 - 111 mmol/L 113(H) 110 109  CO2 22 - 32 mmol/L 17(L) 16(L) 18(L)  Calcium 8.9 - 10.3 mg/dL 7.4(L) 7.5(L) 8.0(L)  Total Protein 6.5 - 8.1 g/dL - 6.1(L) 6.1(L)  Total Bilirubin 0.3 - 1.2 mg/dL - 0.9 1.1  Alkaline Phos 38 - 126 U/L - 78 84  AST 15 - 41 U/L - 80(H) 161(H)  ALT 0 - 44 U/L - 117(H) 165(H)    Lab Results  Component Value Date   PTH 52 04/17/2018   CALCIUM 7.4 (L)  07/08/2018   CAION 1.27 (H) 11/28/2015   PHOS 3.4 04/17/2018       Component Value Date/Time   COLORURINE AMBER (A) 07/08/2018 0839   APPEARANCEUR CLOUDY (A) 07/08/2018 0839   LABSPEC 1.016 07/08/2018 0839   PHURINE 5.0 07/08/2018 0839   GLUCOSEU NEGATIVE 07/08/2018 0839   HGBUR SMALL (A) 07/08/2018 0839   BILIRUBINUR NEGATIVE 07/08/2018 0839   KETONESUR 5 (A) 07/08/2018 0839   PROTEINUR 100 (A) 07/08/2018 0839   UROBILINOGEN 0.2 10/23/2013 1545   NITRITE NEGATIVE 07/08/2018 0839   LEUKOCYTESUR LARGE (A) 07/08/2018 0839      Component Value Date/Time   PHART 7.346 (L) 07/08/2018 1235   PCO2ART 32.0 07/08/2018 1235   PO2ART 86.4 07/08/2018 1235   HCO3 17.0 (L) 07/08/2018 1235   TCO2 25 11/28/2015 1304   ACIDBASEDEF 7.5 (H) 07/08/2018 1235   O2SAT 95.9 07/08/2018 1235       Component Value Date/Time   IRON 40 (L) 04/17/2018 0734   TIBC 260 04/17/2018 0734   FERRITIN 39 04/17/2018 0734   IRONPCTSAT 15 (L) 04/17/2018 0734       ASSESSMENT/PLAN:  1.  Chronic kidney disease stage IV with a baseline serum creatinine around 2.4.  I suspect his chronic kidney disease on the basis of longstanding hypertension, diabetes, atherosclerotic cardiovascular disease, and possibly HIV.  2.  Acute kidney injury.  I suspect this is on the basis of sepsis and decreased renal perfusion.   His urine output is slowly picking up.  We will continue to follow him closely.    Creatinine has plateaued and is trending down.  3.  Sepsis.  He has received appropriate fluid bolus.  Continue antibiotics.  Cultures are growing E. coli in the urine.    Lactate is trending down  4.  Metabolic encephalopathy.  Likely secondary to sepsis.  Monitor mental status closely.  5.  Hyperkalemia.  This is mild and improving.    [place], DO, [place]

## 2018-07-08 NOTE — Consult Note (Addendum)
NEURO HOSPITALIST CONSULT NOTE   Requestig physician: Dr.Mathews  Reason for Consult: Seizure like activity/ AMS   History obtained from:  Chart review  HPI:                                                                                                                                          Keith Young is an 68 y.o. male  With PMH  DM 2, stroke ( 2014), HIV, COPD, HTN, alcoholism ( in remission), AICD, CKD3 who presented to [facility] with vomiting and decreased responsiveness. Neurology consulted for seizure like activity.  Per chart: patient was at a SNF vomited and became lethargic with decreased responsiveness. He was febrile to 102 degrees at SNF. In hospital 100 F is highest recorded temp. Hypotensive to 80's.  Hospital Course: 07/06/2018: admitted; CTH: new age indeterminate right BG small infarct, old right temporal occipital/PCA territory infarct, old BG, thalami, pontine and cerebellar small infarcts. Moderate to severe chronic small vessel ischemic changes. 07/07/2018: nephrology consult 07/08/2018 lactic acid 3.0  Past Medical History:  Diagnosis Date  . AICD (automatic cardioverter/defibrillator) present   . Alcoholism in remission ([place])   . Anemia   . Aortic insufficiency   . Arthritis   . At moderate risk for fall   . Bell's palsy   . Cardiomyopathy ([place])   . Chronic systolic heart failure (HCC)    NYHA Class III  . CKD (chronic kidney disease) stage 3, GFR 30-59 ml/min (HCC)   . COPD (chronic obstructive pulmonary disease) ([place])   . Essential hypertension, benign   . HIV disease ([place]) 09/06/2016  . Hyperlipidemia   . Noncompliance with medication regimen   . Nonischemic cardiomyopathy (HCC)    EF 15-20%  . NSVT (nonsustained ventricular tachycardia) ([place])   . Numbness of right jaw    Had a stoke in 01/2013. Numbness is occasional, especially when trying to chew.  . Productive cough 08/2013   With brown sputum   . SOB (shortness of breath) on  exertion 08/2013  . Stroke ([place]) 01/2013   weakness of right side from CVA  . Type 2 diabetes mellitus ([place])   . Uses hearing aid 2014   recently received new hearing aids    Past Surgical History:  Procedure Laterality Date  . BIOPSY N/A 04/10/2014   Procedure: GASTRIC BIOPSY;  Surgeon: Daneil Dolin, MD;  Location: [facility] ORS;  Service: Endoscopy;  Laterality: N/A;  . BIOPSY  10/12/2015   Procedure: BIOPSY;  Surgeon: Daneil Dolin, MD;  Location: [facility] ENDO SUITE;  Service: Endoscopy;;  stomach bx's  . CARDIAC DEFIBRILLATOR PLACEMENT  11/12/12   [employer] Inogen MINI ICD implanted in [employer] at [place] [facility] per Dr  Branch's note  . COLONOSCOPY WITH PROPOFOL N/A 04/10/2014   RMR: Colonic Diverticulosis  . ESOPHAGOGASTRODUODENOSCOPY (EGD) WITH PROPOFOL N/A 04/10/2014   RMR: Mild erosive reflux esophagitis. Multiple antral polyps likely hyperplastic status post removal by hot snare cautery technique. Diffusely abnormal stomach status post gastric biopsy I suspect some of patients anemaia may be due to intermittent oozing from the stomach. It would be difficult and a risky proposition to attempt complete removal of all of his gastric polyps.  . ESOPHAGOGASTRODUODENOSCOPY (EGD) WITH PROPOFOL N/A 10/12/2015   Procedure: ESOPHAGOGASTRODUODENOSCOPY (EGD) WITH PROPOFOL;  Surgeon: Daneil Dolin, MD;  Location: [facility];  Service: Endoscopy;  Laterality: N/A;  8527 - moved to 9:15  . POLYPECTOMY N/A 04/10/2014   Procedure: GASTRIC POLYPECTOMY;  Surgeon: Daneil Dolin, MD;  Location: [facility] ORS;  Service: Endoscopy;  Laterality: N/A;  . RIGHT HEART CATHETERIZATION N/A 02/06/2014   Procedure: RIGHT HEART CATH;  Surgeon: Larey Dresser, MD;  Location: [facility] CATH LAB;  Service: Cardiovascular;  Laterality: N/A;    Family History  Problem Relation Age of Onset  . Kidney disease Mother   . Kidney disease Sister   . Colon cancer Neg Hx       Social  History:  reports that he quit smoking about 24 years ago. His smoking use included cigarettes. He has never used smokeless tobacco. He reports previous alcohol use. He reports that he does not use drugs.  No Known Allergies  MEDICATIONS:                                                                                                                     Scheduled: . digoxin  0.125 mg Intravenous Daily  . enoxaparin (LOVENOX) injection  30 mg Subcutaneous Q24H  . insulin aspart  0-9 Units Subcutaneous Q4H   Continuous: . cefTRIAXone (ROCEPHIN)  IV 2 g (07/08/18 1122)  .  sodium bicarbonate  infusion 1000 mL     POE:[id number] **OR** acetaminophen, ondansetron **OR** ondansetron (ZOFRAN) IV   ROS:                                                                                                                                        unobtainable from patient due to mental status   Blood pressure 112/87, pulse (!) 116, temperature 99.1 F (37.3 C), temperature source Oral, resp. rate 20, height 5\' 3"  (1.6 m), weight  74.5 kg, SpO2 94 %.   General Examination:                                                                                                      Physical Exam  HEENT-  [place]/AT. Drying spittle along right corner of mouth Lungs- Sonorous, effortful breathing pattern Skin: Patient was diaphoretic. Skin of forehead is warm to touch   Neurological Examination  Mental Status: Awake. Not alert. Nonverbal. Not following commands. Patient moans and grunts to painful stimuli. Grimaces and moves extremities nonpurposefully to noxious stimuli. Actively resists manual eyelid opening.    Cranial Nerves: II: Does not consistently blink to threat. PERRL III,IV, VI: Does not track examiner or initiate saccades towards visual stimuli.  Eyes conjugates. Intermittent nystagmus is seen when eyelids are passively opened. midline. No nystagmus noted. VII: Face is symmetric but will not smile or  grimace to command. Intermittent facial and neck twitching.  XII: Does not protrude tongue to command Motor/ Sensory: Patient able to move all 4 extremities spontaneously and to noxious stimuli. Patient moves left side more than right side. When examiner resists patient movement, strength is grossly 4/5 in BLE and 3/5 in BUE. Intermittently there is right arm twitching that is synchronous with the face and neck twitching documented above. Deep Tendon Reflexes: 2+ and symmetric biceps and patellae Plantars: Right: upgoing   Left: upgoing Cerebellar/Gait: Unable to assess   Lab Results: Basic Metabolic Panel: Recent Labs  Lab 07/04/18 0700 07/06/18 1908 07/07/18 0257 07/07/18 1139 07/08/18 0809  NA 139 136 139 139 139  K 4.7 5.9* 5.6* 5.5* 5.5*  CL 109 105 106 109 110  CO2 23 20* 20* 18* 16*  GLUCOSE 100* 136* 87 94 108*  BUN 45* 43* 47* 54* 74*  CREATININE 2.44* 3.01* 3.72* 4.24* 5.32*  CALCIUM 8.9 8.8* 7.9* 8.0* 7.5*    CBC: Recent Labs  Lab 07/04/18 0700 07/06/18 1908 07/07/18 0257 07/07/18 1139 07/08/18 0809  WBC 3.7* 1.9* 10.3 19.7* 13.8*  NEUTROABS 2.2 1.7  --   --   --   HGB 14.0 15.2 13.1 12.4* 13.1  HCT 45.6 48.8 43.1 42.4 41.1  MCV 91.8 91.2 93.9 96.6 93.2  PLT 124* 107* 87* 80* 73*    Imaging: Dg Chest 1 View  Result Date: 07/07/2018 CLINICAL DATA:  68 year old male with history of tachycardia. EXAM: CHEST  1 VIEW COMPARISON:  Chest x-ray 07/06/2018. FINDINGS: Status post median sternotomy. External defibrillator pad projecting over the lower left hemithorax. Left-sided pacemaker/AICD with lead tip projecting over the expected location of the right ventricular apex. Lung volumes are slightly low. No acute consolidative airspace disease. Small left pleural effusion. No right pleural effusion. There is cephalization of the pulmonary vasculature and slight indistinctness of the interstitial markings suggestive of mild pulmonary edema. Mild cardiomegaly. The patient  is rotated to the left on today's exam, resulting in distortion of the mediastinal contours and reduced diagnostic sensitivity and specificity for mediastinal pathology. Aortic atherosclerosis. IMPRESSION: 1. Mild interstitial pulmonary edema and small left pleural effusion in the setting of  mild cardiomegaly; imaging findings concerning for congestive heart failure. 2. Aortic atherosclerosis. 3. Postoperative changes and support apparatus, as above. Electronically Signed   By: Vinnie Langton M.D.   On: 07/07/2018 11:54   Ct Head Wo Contrast  Result Date: 07/06/2018 CLINICAL DATA:  Altered mental status. History of HIV, hypertension, hypercholesterolemia and diabetes. EXAM: CT HEAD WITHOUT CONTRAST TECHNIQUE: Contiguous axial images were obtained from the base of the skull through the vertex without intravenous contrast. COMPARISON:  CT HEAD July 21, 2016 FINDINGS: BRAIN: No intraparenchymal hemorrhage, mass effect, midline shift or acute large vascular territory infarcts. Old small RIGHT cerebellar and pontine infarcts. RIGHT temporal occipital encephalomalacia. New subcentimeter RIGHT internal capsule versus basal ganglia hypodensities. Old RIGHT basal ganglia infarct with mild ex vacuo dilatation RIGHT lateral ventricle. Old small bilateral thalami and LEFT basal ganglia infarcts. Confluent supratentorial white matter hypodensities. Moderate parenchymal brain volume loss. No hydrocephalus. VASCULAR: Moderate calcific atherosclerosis of the carotid siphons. SKULL: No skull fracture. No significant scalp soft tissue swelling. SINUSES/ORBITS: Mild paranasal sinus mucosal thickening. Chronic dehiscence of the RIGHT posterolateral maxillary wall. Mastoid air cells are well aerated.The included ocular globes and orbital contents are non-suspicious. OTHER: Bilateral parotid sialoliths. IMPRESSION: 1. New age indeterminate RIGHT basal ganglia small infarcts. 2. Old RIGHT temporal occipital/PCA territory infarct.  3. Old basal ganglia, thalami, pontine and cerebellar small infarcts. Moderate to severe chronic small vessel ischemic changes. 4. Moderate parenchymal brain volume loss, advanced for age. Electronically Signed   By: Elon Alas M.D.   On: 07/06/2018 22:16   US Renal  Result Date: 07/07/2018 CLINICAL DATA:  Acute kidney injury, history cardiomyopathy, chronic systolic heart failure, stage III chronic kidney disease, hypertension, COPD, former smoker EXAM: RENAL / URINARY TRACT ULTRASOUND COMPLETE COMPARISON:  09/05/2016 FINDINGS: Right Kidney: Renal measurements: 10.9.7 x 6.1 cm = volume: 200 mL. Cortical thinning. Increased cortical echogenicity. Tiny mid renal cyst 8 x 6.9 mm. Additional peripelvic cyst 3.0 x 3.2 x 3.1 cm, simple features. No additional mass or hydronephrosis. No shadowing calculi. Left Kidney: Renal measurements: 12.3 x 6.6 x 6.6 cm = volume: 276 mL. Cortical thinning. Increased cortical echogenicity. Small cyst at inferior pole 1.0 x 0.9 x 1.2 cm. Additional small cyst at inferior pole 2.0 x 1.6 x 1.9 cm, simple features. No additional mass, hydronephrosis, or shadowing calcification. Bladder: Decompressed, unable to evaluate. IMPRESSION: Medical renal disease changes of both kidneys. Small renal cysts in both kidneys. No evidence of hydronephrosis. Electronically Signed   By: Lavonia Dana M.D.   On: 07/07/2018 19:30   Dg Chest Port 1 View  Result Date: 07/06/2018 CLINICAL DATA:  Sepsis EXAM: PORTABLE CHEST 1 VIEW COMPARISON:  Portable exam 2023 hours compared to 12/09/2016 FINDINGS: LEFT subclavian AICD with lead projecting over RIGHT ventricle. External pacing lead present. Enlargement of cardiac silhouette with postsurgical changes of CABG. Atherosclerotic calcification aorta. Mediastinal contours normal. Subsegmental atelectasis LEFT lower lobe. Pulmonary vascular congestion with interstitial prominence at the mid to lower lungs which could represent minimal pulmonary edema. No  pleural effusion or pneumothorax. Bones demineralized. IMPRESSION: Enlargement of cardiac silhouette with vascular congestion and question minimal pulmonary edema. Electronically Signed   By: Lavonia Dana M.D.   On: 07/06/2018 20:35   rEEG 07/08/2018: IMPRESSION: This recording of the awake and drowsy state shows moderate to severe global slowing indicating moderate to severe global encephalopathy.  However, no epileptiform discharges are observed.  Assessment: 68 year old male with PMHx DM2, stroke ( 2014), HIV, COPD, HTN,  alcoholism ( in remission), AICD, CKD3 who presented to [facility] with vomiting and decreased responsiveness. Neurology consulted for seizure like activity. 1. EEG shows diffuse, symmetric slowing without epileptiform discharges. Seizure essentially ruled out as multiple twitches during exam were not accompanied by electrographic seizure. However, given the severity of his presentation with twitches, a trial of Keppra is being ordered.  2. Overall findings in conjunction with the clinical appearance is a severe encephalopathy/delirium. Highest on the DDx would be a uremic encephalopathy given worsening renal function in conjunction with his worsened encephalopathy. The twitching seen on exam would also be characteristic of a severe metabolic encephalopathy. Breathing pattern is also suggestive of such. Intercurrent UTI could exacerbate but cannot account for his entire clinical picture.  3. Lower on the DDx but also possible would be a meningitis/encephalitis. ID follows the patient in their clinic. His last CD4 in November was not low enough to make opportunistic infection likely, per my conversation with ID (CD4 was 240 and viral load was suppressed at 55 in November 2019).  4. Benzodiazepine or EtOH withdrawal possible. Although I do not see a benzo on his home meds, per my discussions with physician colleagues in the past, group home residents may at times trade favors in exchange for  illicit prescription use.  5. Multifocal old strokes are seen on CT.  6. Has AICD. Most likely will not be able to perform MRI  Recommendations: -Keppra 1500 mg IV has been ordered followed by 500 mg BID -Given that uremic encephalopathy is high on the DDx, he may benefit from initiation of dialysis -Flouro guided LP tomorrow after Lovenox has been held for > 12 hours. Given that he is agitated and unable to cooperate at the bedside, he will need administration of 2 mg Ativan prior to LP and this will not be able to be performed by the neurology service. Fluoro guidance is indicated. Please hold lovenox until LP completed -Starting empiric meningitis dose antibiotics with ceftriaxone 2 g IV q12h, ampicillin 2 g IV q12h, vancomycin (pharmacy to dose) and acyclovir 10 mg/kg IV q8h x 10 days. -NPO after midnight for LP -LP labs: cell count with differential, protein, glucose, HSV PCR, gram stain, bacterial culture, fungal culture, cryptococcal antigen -Has AICD. Most likely will not be able to perform MRI  Laurey Morale, MSN, NP-C [facility] [phone]   I have seen and examined the patient. I have added pertinent findings to the documented exam above. I have formulated the assessment and recommendations. 68 year old male with HIV on antiretroviral therapy presenting with severe encephalopathy and fever. Neck stiffness also noted on exam. DDx as above includes toxic/metabolic, infectious and EEG negative seizures.  Electronically signed: Dr. Kerney Elbe 07/08/2018, 11:56 AM

## 2018-07-08 NOTE — Progress Notes (Signed)
Pt HR elevated in 150s. Pt continues to sleep. Pt given IV metoprolol during day shift with no changes. MD paged. Will start cardizem drip according to MD orders and continue to monitor.

## 2018-07-08 NOTE — Progress Notes (Signed)
MD notified about patient's HR being in low 140s. BP 95/74, sat O2 94% RA. Will continue to monitor.

## 2018-07-08 NOTE — Progress Notes (Signed)
Patient's HR in high 110s now, A Fib. BP 105/61(74). MD notified. Will continue to monitor.

## 2018-07-08 NOTE — Progress Notes (Signed)
MD was notified about patient's last set of VS before shift change. BP 117/96 (105); HR 110; Resp 25 and Sat O2 94% on RA. On tele the rhythm is A Fib. Will continue to monitor.

## 2018-07-08 NOTE — Progress Notes (Signed)
I notified multiple times the pharmacy for [place] IV. Medication still not available. Will continue to monitor.

## 2018-07-08 NOTE — Progress Notes (Addendum)
MD was notified that patient's HR is in 150s, BP 115/98,sat O2 96% RA. Will continue to monitor.

## 2018-07-08 NOTE — Procedures (Signed)
  Gallant A. Merlene Laughter, MD     [email or web address]           HISTORY: This is a 68 year old male who presents with confusion, altered mental status and jerking suspicious for possible seizures.  MEDICATIONS: Scheduled Meds: . digoxin  0.125 mg Intravenous Daily  . enoxaparin (LOVENOX) injection  30 mg Subcutaneous Q24H  . insulin aspart  0-9 Units Subcutaneous Q4H   Continuous Infusions: . cefTRIAXone (ROCEPHIN)  IV 2 g (07/08/18 1122)  .  sodium bicarbonate  infusion 1000 mL 60 mL/hr at 07/08/18 1202   PRN Meds:.acetaminophen **OR** acetaminophen, ondansetron **OR** ondansetron (ZOFRAN) IV     ANALYSIS: A 16 channel recording using standard 10 20 measurements is conducted for 24 minutes.  The EEG recording somewhat degraded by 60 cycle artifact although turning on the 60 cycle notch helps.  The background activity gets as high as 6-7 Hz.  There is beta activity observed in the frontal areas.  Photic stimulation and hyperventilation are not carried out.  There is significant amount of myogenic artifact seen throughout the recording.  Additional associated with the significant electrographic changes.  No focal or lateralized slowing is observed.  No epileptiform activity is observed.   IMPRESSION: This recording of the awake and drowsy state shows moderate to severe global slowing indicating moderate to severe global encephalopathy.  However, no epileptiform discharges are observed.      Sharine Cadle A. Merlene Laughter, M.D.  Diplomate, Tax adviser of Psychiatry and Neurology ( Neurology).

## 2018-07-08 NOTE — Progress Notes (Signed)
Patient's Resp is 40-50 and HR is fluctuating between 120-150. MD notified. Will continue to monitor.

## 2018-07-08 NOTE — Progress Notes (Signed)
Pharmacy Antibiotic Note  Keith Young is a 68 y.o. male admitted on 07/06/2018 with sepsis and possible meningitis.  Pharmacy has been consulted for vancomycin and acyclovir dosing.  Plan: Vancomycin 1,500 mg IV x 1, followed by vancomycin 1,000 mg IV q48h (goal trough 15-20 mcg/mL) Acyclovir IV 10 mg/kg/dose every 24 hours Monitor cultures, renal function, and vancomycin levels PRN  Height: 5\' 3"  (160 cm) Weight: 164 lb 3.2 oz (74.5 kg) IBW/kg (Calculated) : 56.9  Temp (24hrs), Avg:99.4 F (37.4 C), Min:98.6 F (37 C), Max:100 F (37.8 C)  Recent Labs  Lab 07/04/18 0700 07/06/18 1908 07/07/18 0215 07/07/18 0257 07/07/18 1139 07/07/18 1651 07/08/18 0809  WBC 3.7* 1.9*  --  10.3 19.7*  --  13.8*  CREATININE 2.44* 3.01*  --  3.72* 4.24*  --  5.32*  LATICACIDVEN  --  2.5* 1.6 3.0* 3.3* 3.0*  --     Estimated Creatinine Clearance: 12.3 mL/min (A) (by C-G formula based on SCr of 5.32 mg/dL (H)).    No Known Allergies  Antimicrobials this admission: Cefepime 2/7 >> 2/9 Vancomycin x 1 on 2/7 Ceftriaxone 2/8 >> Vancomycin 2/9 >> Acyclovir 2/9 >> Ampicillin 2/9 >>   Dose adjustments this admission: Ceftriaxone 2 g q24h >> 2 g q12h  Microbiology results: 2/7 BCx: ecoli 2/9 UCx: sent  2/8 MRSA PCR: negative  Thank you for allowing pharmacy to be a part of this patient's care.  Vertis Kelch, PharmD PGY1 Pharmacy Resident Phone [phone] 07/08/2018       2:23 PM

## 2018-07-08 NOTE — Progress Notes (Addendum)
PROGRESS NOTE    Keith Young  AXK:[id number] DOB: 02/06/1951 DOA: 07/06/2018 PCP: Virgie Dad, MD   Brief Narrative: 68 y.o. male with medical history significant of systolic CHF ejection fraction 30% as well as chronic kidney disease followed by nephrology also a history of AICD placement followed by cardiology.  He also has a history of HIV followed by infectious disease-as well as type 2 diabetes, COPD, hypertension, and a history of CVA.  He is normally interactive and talkative; however, this evening he had an acute change where hevomited once became lethargicwithdecreasedresponsiveness.  He was found to be significantly warm to touch his temperature was taken axillary and it was in excess of 102 degrees.  He was also hypotensive with a blood pressure in the 78M systolically. Oxygen saturation was in the mid 90s on room air.  Patient was sent in to the ED emergently.  ED Course: In the ED patient initially unresponsive; however, he was not intubated secondary to his code status being DNR.  He has remained hypotensive in the 80s despite 30cc/kg IVF bolus, his mental status has improved however and he is now able to wake up and answer some questions:  He denies pain anywhere, denies headache, abd pain, SOB.  CXR neg.  Reportedly had malodorous urine though hasnt produced urine since arrival so no UA yet.  WBC 1.9k, creat 3.0 up from 2.5 baseline.   Assessment & Plan:   Principal Problem:   Septic shock ([place]) Active Problems:   Chronic kidney disease (CKD), active medical management without dialysis, stage 3 (moderate) (HCC)   Diabetes mellitus type 2 in nonobese [facility])   Essential hypertension, benign   ICD (implantable cardioverter-defibrillator) in place   HIV disease ([place])   Chronic combined systolic and diastolic CHF (congestive heart failure) ([place])   Acute metabolic encephalopathy   1) sepsis with E. coli bacteremia and acute metabolic encephalopathy  multifactorial-antibiotics de-escalated to Rocephin yesterday.  Continue the same.  MRSA PCR negative.   Addendum-neuro extended coverage for meningitis /encephalitis.For LP tomorrow.  2) AKI on CKD stage IV-renal ultrasound shows medical renal disease changes in both kidneys no hydronephrosis.  Creatinine has been trending up.  Urine output over last 24 hours is 200 cc.  He is positive by 4 L.  Nephrology following start him on bicarb drip.  3)?  Seizure-like activity with twitching of the upper extremities and more unresponsive today but responds only to pain.  Discussed with neurology appreciate their input.  EEG no seizures..C  T of the head shows new age-indeterminate right basal ganglia small infarcts, old right temporal occipital PCA territory infarct old basal ganglia thalamus pontine cerebellar small infarct moderate to severe chronic small ischemic changes and moderate parenchymal brain volume loss advanced for   Age.  Patient has had multiple strokes in the past.  4)HIV CD4 count above 200  5) history of CHF ejection fraction 30%/AICD 11/2016 chest x-ray showed  pulmonary edema have not given him any Lasix yet due to soft blood pressure.  6) type 2 diabetes ssi  7) hypertension hold home medications.  Estimated body mass index is 29.09 kg/m as calculated from the following:   Height as of this encounter: 5\' 3"  (1.6 m).   Weight as of this encounter: 74.5 kg.  DVT prophylaxis: lovenox on hold for LP. code Status: DO NOT RESUSCITATE Family Communication: I have called the number available [phone]  that belongs to a group home called [facility] status center.  The lady gave me a number to the administrator and Elvis Coil  [phone].  He tells me that this patient has been moved out of that place for a long time ago he gave me number to patient's sister Audrie Gallus [phone].  I have left a voicemail for her to call me back. Disposition Plan: Pending clinical  improvement   Consultants: Nephrology and neurology  Procedures: None Antimicrobials: Rocephin  Subjective:  Unresponsive responds to painful stimuli upper extremity twitching Objective: Vitals:   07/08/18 0210 07/08/18 0410 07/08/18 0727 07/08/18 0900  BP: 101/64 97/64 102/73 119/76  Pulse:      Resp: (!) 21 (!) 23 (!) 21 (!) 25  Temp: 99.4 F (37.4 C) 98.7 F (37.1 C) 99.3 F (37.4 C) 99.9 F (37.7 C)  TempSrc: Axillary Axillary Oral Oral  SpO2: 94% 95% 94% 96%  Weight:      Height:        Intake/Output Summary (Last 24 hours) at 07/08/2018 1142 Last data filed at 07/08/2018 1122 Gross per 24 hour  Intake 700 ml  Output 200 ml  Net 500 ml   Filed Weights   07/06/18 1903  Weight: 74.5 kg    Examination:  General exam: Appears calm and comfortable  Respiratory system: scattered rhonchi  to auscultation. Respiratory effort normal. Cardiovascular system: S1 & S2 heard, RRR. No JVD, murmurs, rubs, gallops or clicks. No pedal edema. Gastrointestinal system: Abdomen is nondistended, soft and nontender. No organomegaly or masses felt. Normal bowel sounds heard. Central nervous system confused  Extremities: 1+ pitting edema Skin: No rashes, lesions or ulcers.     Data Reviewed: I have personally reviewed following labs and imaging studies  CBC: Recent Labs  Lab 07/04/18 0700 07/06/18 1908 07/07/18 0257 07/07/18 1139 07/08/18 0809  WBC 3.7* 1.9* 10.3 19.7* 13.8*  NEUTROABS 2.2 1.7  --   --   --   HGB 14.0 15.2 13.1 12.4* 13.1  HCT 45.6 48.8 43.1 42.4 41.1  MCV 91.8 91.2 93.9 96.6 93.2  PLT 124* 107* 87* 80* 73*   Basic Metabolic Panel: Recent Labs  Lab 07/04/18 0700 07/06/18 1908 07/07/18 0257 07/07/18 1139 07/08/18 0809  NA 139 136 139 139 139  K 4.7 5.9* 5.6* 5.5* 5.5*  CL 109 105 106 109 110  CO2 23 20* 20* 18* 16*  GLUCOSE 100* 136* 87 94 108*  BUN 45* 43* 47* 54* 74*  CREATININE 2.44* 3.01* 3.72* 4.24* 5.32*  CALCIUM 8.9 8.8* 7.9* 8.0* 7.5*    GFR: Estimated Creatinine Clearance: 12.3 mL/min (A) (by C-G formula based on SCr of 5.32 mg/dL (H)). Liver Function Tests: Recent Labs  Lab 07/06/18 1908 07/07/18 0257 07/07/18 1139 07/08/18 0809  AST 70* 320* 161* 80*  ALT 46* 216* 165* 117*  ALKPHOS 102 93 84 78  BILITOT 0.9 1.0 1.1 0.9  PROT 7.1 6.2* 6.1* 6.1*  ALBUMIN 3.0* 2.5* 2.5* 2.3*   No results for input(s): LIPASE, AMYLASE in the last 168 hours. No results for input(s): AMMONIA in the last 168 hours. Coagulation Profile: No results for input(s): INR, PROTIME in the last 168 hours. Cardiac Enzymes: No results for input(s): CKTOTAL, CKMB, CKMBINDEX, TROPONINI in the last 168 hours. BNP (last 3 results) No results for input(s): PROBNP in the last 8760 hours. HbA1C: No results for input(s): HGBA1C in the last 72 hours. CBG: Recent Labs  Lab 07/07/18 1604 07/07/18 2003 07/08/18 0009 07/08/18 0513 07/08/18 0737  GLUCAP 86 116* 125* 99 111*  Lipid Profile: No results for input(s): CHOL, HDL, LDLCALC, TRIG, CHOLHDL, LDLDIRECT in the last 72 hours. Thyroid Function Tests: No results for input(s): TSH, T4TOTAL, FREET4, T3FREE, THYROIDAB in the last 72 hours. Anemia Panel: No results for input(s): VITAMINB12, FOLATE, FERRITIN, TIBC, IRON, RETICCTPCT in the last 72 hours. Sepsis Labs: Recent Labs  Lab 07/07/18 0215 07/07/18 0257 07/07/18 1139 07/07/18 1651  LATICACIDVEN 1.6 3.0* 3.3* 3.0*    Recent Results (from the past 240 hour(s))  Blood Culture (routine x 2)     Status: None (Preliminary result)   Collection Time: 07/06/18  7:07 PM  Result Value Ref Range Status   Specimen Description BLOOD RIGHT FOREARM  Final   Special Requests   Final    BOTTLES DRAWN AEROBIC AND ANAEROBIC Blood Culture adequate volume   Culture  Setup Time   Final    GRAM NEGATIVE RODS ANAEROBIC BOTTLE ONLY CRITICAL VALUE NOTED.  VALUE IS CONSISTENT WITH PREVIOUSLY REPORTED AND CALLED VALUE.    Culture   Final    NO GROWTH  < 24 HOURS Performed at [facility], [place] [address], [place], [place] [address]    Report Status PENDING  Incomplete  Blood Culture (routine x 2)     Status: Abnormal (Preliminary result)   Collection Time: 07/06/18  7:40 PM  Result Value Ref Range Status   Specimen Description BLOOD LEFT ANTECUBITAL  Final   Special Requests   Final    BOTTLES DRAWN AEROBIC AND ANAEROBIC Blood Culture adequate volume   Culture  Setup Time   Final    GRAM NEGATIVE RODS IN BOTH AEROBIC AND ANAEROBIC BOTTLES CRITICAL RESULT CALLED TO, READ BACK BY AND VERIFIED WITHRonnald Nian [phone] FCP Performed at [facility], [place] [address] [place], [place] [address]    Culture ESCHERICHIA COLI (A)  Final   Report Status PENDING  Incomplete  Blood Culture ID Panel (Reflexed)     Status: Abnormal   Collection Time: 07/06/18  7:40 PM  Result Value Ref Range Status   Enterococcus species NOT DETECTED NOT DETECTED Final   Listeria monocytogenes NOT DETECTED NOT DETECTED Final   Staphylococcus species NOT DETECTED NOT DETECTED Final   Staphylococcus aureus (BCID) NOT DETECTED NOT DETECTED Final   Streptococcus species NOT DETECTED NOT DETECTED Final   Streptococcus agalactiae NOT DETECTED NOT DETECTED Final   Streptococcus pneumoniae NOT DETECTED NOT DETECTED Final   Streptococcus pyogenes NOT DETECTED NOT DETECTED Final   Acinetobacter baumannii NOT DETECTED NOT DETECTED Final   Enterobacteriaceae species DETECTED (A) NOT DETECTED Final    Comment: Enterobacteriaceae represent a large family of gram-negative bacteria, not a single organism. CRITICAL RESULT CALLED TO, READ BACK BY AND VERIFIED WITH: PHARMD MANCHIL, B [phone] FCP    Enterobacter cloacae complex NOT DETECTED NOT DETECTED Final   Escherichia coli DETECTED (A) NOT DETECTED Final    Comment: CRITICAL RESULT CALLED TO, READ BACK BY AND VERIFIED WITH: PHARMD MANCHIL, B [phone] FCP    Klebsiella oxytoca NOT DETECTED  NOT DETECTED Final   Klebsiella pneumoniae NOT DETECTED NOT DETECTED Final   Proteus species NOT DETECTED NOT DETECTED Final   Serratia marcescens NOT DETECTED NOT DETECTED Final   Carbapenem resistance NOT DETECTED NOT DETECTED Final   Haemophilus influenzae NOT DETECTED NOT DETECTED Final   Neisseria meningitidis NOT DETECTED NOT DETECTED Final   Pseudomonas aeruginosa NOT DETECTED NOT DETECTED Final   Candida albicans NOT DETECTED NOT DETECTED Final  Candida glabrata NOT DETECTED NOT DETECTED Final   Candida krusei NOT DETECTED NOT DETECTED Final   Candida parapsilosis NOT DETECTED NOT DETECTED Final   Candida tropicalis NOT DETECTED NOT DETECTED Final    Comment: Performed at [facility], [place] [address], [place], [place] [address]  MRSA PCR Screening     Status: None   Collection Time: 07/07/18  3:24 PM  Result Value Ref Range Status   MRSA by PCR NEGATIVE NEGATIVE Final    Comment:        The GeneXpert MRSA Assay (FDA approved for NASAL specimens only), is one component of a comprehensive MRSA colonization surveillance program. It is not intended to diagnose MRSA infection nor to guide or monitor treatment for MRSA infections. Performed at [facility], [place] [address], [place], [place] [address]          Radiology Studies: Dg Chest 1 View  Result Date: 07/07/2018 CLINICAL DATA:  68 year old male with history of tachycardia. EXAM: CHEST  1 VIEW COMPARISON:  Chest x-ray 07/06/2018. FINDINGS: Status post median sternotomy. External defibrillator pad projecting over the lower left hemithorax. Left-sided pacemaker/AICD with lead tip projecting over the expected location of the right ventricular apex. Lung volumes are slightly low. No acute consolidative airspace disease. Small left pleural effusion. No right pleural effusion. There is cephalization of the pulmonary vasculature and slight indistinctness of the interstitial markings suggestive of mild pulmonary  edema. Mild cardiomegaly. The patient is rotated to the left on today's exam, resulting in distortion of the mediastinal contours and reduced diagnostic sensitivity and specificity for mediastinal pathology. Aortic atherosclerosis. IMPRESSION: 1. Mild interstitial pulmonary edema and small left pleural effusion in the setting of mild cardiomegaly; imaging findings concerning for congestive heart failure. 2. Aortic atherosclerosis. 3. Postoperative changes and support apparatus, as above. Electronically Signed   By: Vinnie Langton M.D.   On: 07/07/2018 11:54   Ct Head Wo Contrast  Result Date: 07/06/2018 CLINICAL DATA:  Altered mental status. History of HIV, hypertension, hypercholesterolemia and diabetes. EXAM: CT HEAD WITHOUT CONTRAST TECHNIQUE: Contiguous axial images were obtained from the base of the skull through the vertex without intravenous contrast. COMPARISON:  CT HEAD July 21, 2016 FINDINGS: BRAIN: No intraparenchymal hemorrhage, mass effect, midline shift or acute large vascular territory infarcts. Old small RIGHT cerebellar and pontine infarcts. RIGHT temporal occipital encephalomalacia. New subcentimeter RIGHT internal capsule versus basal ganglia hypodensities. Old RIGHT basal ganglia infarct with mild ex vacuo dilatation RIGHT lateral ventricle. Old small bilateral thalami and LEFT basal ganglia infarcts. Confluent supratentorial white matter hypodensities. Moderate parenchymal brain volume loss. No hydrocephalus. VASCULAR: Moderate calcific atherosclerosis of the carotid siphons. SKULL: No skull fracture. No significant scalp soft tissue swelling. SINUSES/ORBITS: Mild paranasal sinus mucosal thickening. Chronic dehiscence of the RIGHT posterolateral maxillary wall. Mastoid air cells are well aerated.The included ocular globes and orbital contents are non-suspicious. OTHER: Bilateral parotid sialoliths. IMPRESSION: 1. New age indeterminate RIGHT basal ganglia small infarcts. 2. Old RIGHT  temporal occipital/PCA territory infarct. 3. Old basal ganglia, thalami, pontine and cerebellar small infarcts. Moderate to severe chronic small vessel ischemic changes. 4. Moderate parenchymal brain volume loss, advanced for age. Electronically Signed   By: Elon Alas M.D.   On: 07/06/2018 22:16   US Renal  Result Date: 07/07/2018 CLINICAL DATA:  Acute kidney injury, history cardiomyopathy, chronic systolic heart failure, stage III chronic kidney disease, hypertension, COPD, former smoker EXAM: RENAL / URINARY TRACT ULTRASOUND COMPLETE COMPARISON:  09/05/2016 FINDINGS: Right  Kidney: Renal measurements: 10.9.7 x 6.1 cm = volume: 200 mL. Cortical thinning. Increased cortical echogenicity. Tiny mid renal cyst 8 x 6.9 mm. Additional peripelvic cyst 3.0 x 3.2 x 3.1 cm, simple features. No additional mass or hydronephrosis. No shadowing calculi. Left Kidney: Renal measurements: 12.3 x 6.6 x 6.6 cm = volume: 276 mL. Cortical thinning. Increased cortical echogenicity. Small cyst at inferior pole 1.0 x 0.9 x 1.2 cm. Additional small cyst at inferior pole 2.0 x 1.6 x 1.9 cm, simple features. No additional mass, hydronephrosis, or shadowing calcification. Bladder: Decompressed, unable to evaluate. IMPRESSION: Medical renal disease changes of both kidneys. Small renal cysts in both kidneys. No evidence of hydronephrosis. Electronically Signed   By: Lavonia Dana M.D.   On: 07/07/2018 19:30   Dg Chest Port 1 View  Result Date: 07/06/2018 CLINICAL DATA:  Sepsis EXAM: PORTABLE CHEST 1 VIEW COMPARISON:  Portable exam 2023 hours compared to 12/09/2016 FINDINGS: LEFT subclavian AICD with lead projecting over RIGHT ventricle. External pacing lead present. Enlargement of cardiac silhouette with postsurgical changes of CABG. Atherosclerotic calcification aorta. Mediastinal contours normal. Subsegmental atelectasis LEFT lower lobe. Pulmonary vascular congestion with interstitial prominence at the mid to lower lungs which  could represent minimal pulmonary edema. No pleural effusion or pneumothorax. Bones demineralized. IMPRESSION: Enlargement of cardiac silhouette with vascular congestion and question minimal pulmonary edema. Electronically Signed   By: Lavonia Dana M.D.   On: 07/06/2018 20:35        Scheduled Meds: . digoxin  0.125 mg Intravenous Daily  . enoxaparin (LOVENOX) injection  30 mg Subcutaneous Q24H  . insulin aspart  0-9 Units Subcutaneous Q4H   Continuous Infusions: . cefTRIAXone (ROCEPHIN)  IV 2 g (07/08/18 1122)  .  sodium bicarbonate  infusion 1000 mL       LOS: 1 day     Georgette Shell, MD [facility]   If 7PM-7AM, please contact night-coverage [email or web address] Password [email or web address] 07/08/2018, 11:42 AM

## 2018-07-09 ENCOUNTER — Inpatient Hospital Stay (HOSPITAL_COMMUNITY): Payer: Medicare HMO

## 2018-07-09 ENCOUNTER — Encounter (HOSPITAL_COMMUNITY): Payer: Self-pay | Admitting: Interventional Radiology

## 2018-07-09 DIAGNOSIS — R410 Disorientation, unspecified: Secondary | ICD-10-CM

## 2018-07-09 DIAGNOSIS — R7881 Bacteremia: Secondary | ICD-10-CM

## 2018-07-09 DIAGNOSIS — A0472 Enterocolitis due to Clostridium difficile, not specified as recurrent: Secondary | ICD-10-CM

## 2018-07-09 DIAGNOSIS — A419 Sepsis, unspecified organism: Secondary | ICD-10-CM

## 2018-07-09 DIAGNOSIS — N179 Acute kidney failure, unspecified: Secondary | ICD-10-CM

## 2018-07-09 DIAGNOSIS — A4151 Sepsis due to Escherichia coli [E. coli]: Principal | ICD-10-CM

## 2018-07-09 DIAGNOSIS — G9341 Metabolic encephalopathy: Secondary | ICD-10-CM

## 2018-07-09 DIAGNOSIS — G9340 Encephalopathy, unspecified: Secondary | ICD-10-CM

## 2018-07-09 DIAGNOSIS — I5042 Chronic combined systolic (congestive) and diastolic (congestive) heart failure: Secondary | ICD-10-CM

## 2018-07-09 DIAGNOSIS — B9620 Unspecified Escherichia coli [E. coli] as the cause of diseases classified elsewhere: Secondary | ICD-10-CM

## 2018-07-09 DIAGNOSIS — Z95810 Presence of automatic (implantable) cardiac defibrillator: Secondary | ICD-10-CM

## 2018-07-09 DIAGNOSIS — G0490 Encephalitis and encephalomyelitis, unspecified: Secondary | ICD-10-CM

## 2018-07-09 DIAGNOSIS — R000 Tachycardia, unspecified: Secondary | ICD-10-CM

## 2018-07-09 DIAGNOSIS — B20 Human immunodeficiency virus [HIV] disease: Secondary | ICD-10-CM

## 2018-07-09 DIAGNOSIS — Z79899 Other long term (current) drug therapy: Secondary | ICD-10-CM

## 2018-07-09 DIAGNOSIS — Z87891 Personal history of nicotine dependence: Secondary | ICD-10-CM

## 2018-07-09 DIAGNOSIS — I4891 Unspecified atrial fibrillation: Secondary | ICD-10-CM

## 2018-07-09 DIAGNOSIS — N183 Chronic kidney disease, stage 3 (moderate): Secondary | ICD-10-CM

## 2018-07-09 DIAGNOSIS — R6521 Severe sepsis with septic shock: Secondary | ICD-10-CM

## 2018-07-09 DIAGNOSIS — I10 Essential (primary) hypertension: Secondary | ICD-10-CM

## 2018-07-09 HISTORY — PX: IR FLUORO GUIDE CV LINE RIGHT: IMG2283

## 2018-07-09 HISTORY — PX: IR US GUIDE VASC ACCESS RIGHT: IMG2390

## 2018-07-09 LAB — GLUCOSE, CAPILLARY
GLUCOSE-CAPILLARY: 70 mg/dL (ref 70–99)
Glucose-Capillary: 109 mg/dL — ABNORMAL HIGH (ref 70–99)
Glucose-Capillary: 127 mg/dL — ABNORMAL HIGH (ref 70–99)
Glucose-Capillary: 116 mg/dL — ABNORMAL HIGH (ref 70–99)
Glucose-Capillary: 88 mg/dL (ref 70–99)
Glucose-Capillary: 79 mg/dL (ref 70–99)

## 2018-07-09 LAB — COMPREHENSIVE METABOLIC PANEL
ALT: 79 U/L — ABNORMAL HIGH (ref 0–44)
AST: 57 U/L — ABNORMAL HIGH (ref 15–41)
Albumin: 2.1 g/dL — ABNORMAL LOW (ref 3.5–5.0)
Alkaline Phosphatase: 78 U/L (ref 38–126)
Anion gap: 10 (ref 5–15)
BUN: 83 mg/dL — ABNORMAL HIGH (ref 8–23)
CO2: 18 mmol/L — ABNORMAL LOW (ref 22–32)
CREATININE: 5.39 mg/dL — AB (ref 0.61–1.24)
Calcium: 7.1 mg/dL — ABNORMAL LOW (ref 8.9–10.3)
Chloride: 114 mmol/L — ABNORMAL HIGH (ref 98–111)
GFR calc Af Amer: 12 mL/min — ABNORMAL LOW (ref >60)
GFR, EST NON AFRICAN AMERICAN: 10 mL/min — AB (ref >60)
Glucose, Bld: 136 mg/dL — ABNORMAL HIGH (ref 70–99)
Potassium: 4.5 mmol/L (ref 3.5–5.1)
Sodium: 142 mmol/L (ref 135–145)
Total Bilirubin: 0.8 mg/dL (ref 0.3–1.2)
Total Protein: 5.9 g/dL — ABNORMAL LOW (ref 6.5–8.1)

## 2018-07-09 LAB — C DIFFICILE QUICK SCREEN W PCR REFLEX
C Diff antigen: POSITIVE — AB
C Diff toxin: NEGATIVE

## 2018-07-09 LAB — CBC WITH DIFFERENTIAL/PLATELET
Abs Immature Granulocytes: 0.06 10*3/uL (ref 0.00–0.07)
Basophils Absolute: 0 10*3/uL (ref 0.0–0.1)
Basophils Relative: 0 %
EOS ABS: 0.3 10*3/uL (ref 0.0–0.5)
EOS PCT: 2 %
HEMATOCRIT: 39.8 % (ref 39.0–52.0)
Hemoglobin: 12.5 g/dL — ABNORMAL LOW (ref 13.0–17.0)
Immature Granulocytes: 0 %
Lymphocytes Relative: 1 %
Lymphs Abs: 0.2 10*3/uL — ABNORMAL LOW (ref 0.7–4.0)
MCH: 28.3 pg (ref 26.0–34.0)
MCHC: 31.4 g/dL (ref 30.0–36.0)
MCV: 90 fL (ref 80.0–100.0)
Monocytes Absolute: 0.5 10*3/uL (ref 0.1–1.0)
Monocytes Relative: 3 %
Neutro Abs: 14.9 10*3/uL — ABNORMAL HIGH (ref 1.7–7.7)
Neutrophils Relative %: 94 %
Platelets: 73 10*3/uL — ABNORMAL LOW (ref 150–400)
RBC: 4.42 MIL/uL (ref 4.22–5.81)
RDW: 14.6 % (ref 11.5–15.5)
WBC: 16 10*3/uL — ABNORMAL HIGH (ref 4.0–10.5)
nRBC: 0 % (ref 0.0–0.2)

## 2018-07-09 LAB — CULTURE, BLOOD (ROUTINE X 2): Special Requests: ADEQUATE

## 2018-07-09 LAB — RAPID URINE DRUG SCREEN, HOSP PERFORMED
Amphetamines: NOT DETECTED
Barbiturates: NOT DETECTED
Benzodiazepines: NOT DETECTED
Cocaine: NOT DETECTED
Opiates: NOT DETECTED
TETRAHYDROCANNABINOL: NOT DETECTED

## 2018-07-09 LAB — URINE CULTURE: Culture: NO GROWTH

## 2018-07-09 LAB — CLOSTRIDIUM DIFFICILE BY PCR, REFLEXED: Toxigenic C. Difficile by PCR: POSITIVE — AB

## 2018-07-09 LAB — TSH: TSH: 3.098 u[IU]/mL (ref 0.350–4.500)

## 2018-07-09 LAB — PROCALCITONIN: Procalcitonin: >150 ng/mL

## 2018-07-09 MED ORDER — SODIUM CHLORIDE 0.9 % IV SOLN: 100.0000 mL | INTRAVENOUS | Status: DC | PRN

## 2018-07-09 MED ORDER — LIDOCAINE HCL 1 % IJ SOLN
INTRAMUSCULAR | Status: AC
  Administered 2018-07-09: 5 mL
  Filled 2018-07-09: qty 20

## 2018-07-09 MED ORDER — SODIUM CHLORIDE 0.9 % IV SOLN
2.0000 g | INTRAVENOUS | Status: DC
  Filled 2018-07-09: qty 20

## 2018-07-09 MED ORDER — CHLORHEXIDINE GLUCONATE CLOTH 2 % EX PADS
6.0000 | MEDICATED_PAD | Freq: Every day | CUTANEOUS | Status: DC
  Administered 2018-07-09 – 2018-07-22 (×10): 6 via TOPICAL

## 2018-07-09 MED ORDER — ALTEPLASE 2 MG IJ SOLR
2.0000 mg | Freq: Once | INTRAMUSCULAR | Status: DC | PRN
  Filled 2018-07-09: qty 2

## 2018-07-09 MED ORDER — BICTEGRAVIR-EMTRICITAB-TENOFOV 50-200-25 MG PO TABS
1.0000 | ORAL_TABLET | Freq: Every day | ORAL | Status: DC
  Administered 2018-07-10 – 2018-07-25 (×15): 1 via ORAL
  Filled 2018-07-09 (×19): qty 1

## 2018-07-09 MED ORDER — METOPROLOL TARTRATE 5 MG/5ML IV SOLN
2.5000 mg | Freq: Three times a day (TID) | INTRAVENOUS | Status: DC
  Administered 2018-07-09 – 2018-07-10 (×2): 2.5 mg via INTRAVENOUS
  Filled 2018-07-09 (×2): qty 5

## 2018-07-09 MED ORDER — HEPARIN SODIUM (PORCINE) 1000 UNIT/ML IJ SOLN
INTRAMUSCULAR | Status: AC
  Filled 2018-07-09: qty 1

## 2018-07-09 MED ORDER — HEPARIN SODIUM (PORCINE) 1000 UNIT/ML IJ SOLN
INTRAMUSCULAR | Status: AC
  Administered 2018-07-09: 2.8 mL
  Filled 2018-07-09: qty 1

## 2018-07-09 MED ORDER — HEPARIN SODIUM (PORCINE) 1000 UNIT/ML DIALYSIS
1000.0000 [IU] | INTRAMUSCULAR | Status: DC | PRN
  Filled 2018-07-09: qty 1

## 2018-07-09 NOTE — Progress Notes (Signed)
Alerted floor RN Alvera Singh, Jose A regarding cardizem drip states infusion complete advised he would be up shortly to resolve.

## 2018-07-09 NOTE — H&P (Signed)
   Patient Status: [facility] - In-pt  Assessment and Plan:  Encephalopathy due to sepsis and renal failure.  Will proceed with urgent placement of temporary hemodialysis catheter.  Attempted to contact patient's sister several times and left urgent voice messages.  Discussed with Dr. Grayland Ormond and she has deemed this an emergency. ______________________________________________________________________   History of Present Illness: Keith Young is a 68 y.o. male with a past medical history significant for systolic congestive heart failure with an ejection fraction of 30%, chronic kidney disease stage IV.  Baseline creatinine 2.4 from 07/04/2018.  He presented to the emergency department with acute mental status change and lethargy.  He was febrile with a temp of 102.  He was notably hypotensive with a systolic blood pressure in the 80s.    Broad-spectrum antibiotics were started.  His serum creatinine has worsened to 4.2, with minimal urine output.  Renal consultation has been called for acute kidney injury.  We are asked to place a temporary HD catheter for urgent hemodialysis.  Allergies and medications reviewed.   Review of Systems  Unable to perform ROS: Mental status change    Vital Signs: BP (!) 112/56 (BP Location: Right Leg)   Pulse 85   Temp 99.6 F (37.6 C) (Axillary)   Resp (!) 28   Ht 5\' 3"  (1.6 m)   Wt 74.5 kg   SpO2 93%   BMI 29.09 kg/m   Physical Exam Vitals signs reviewed.  Constitutional:      Comments: Lethargic, does not respond at all to me.  HENT:     Head: Normocephalic and atraumatic.  Neck:     Musculoskeletal: Normal range of motion.  Cardiovascular:     Rate and Rhythm: Normal rate.  Pulmonary:     Effort: Pulmonary effort is normal.  Abdominal:     Palpations: Abdomen is soft.  Neurological:     Comments: Lethargic and unresponsive  Psychiatric:     Comments: Unable to assess      Imaging reviewed.   Labs:  COAGS: No  results for input(s): INR, APTT in the last 8760 hours.  BMP: Recent Labs    07/07/18 1139 07/08/18 0809 07/08/18 1626 07/09/18 0437  NA 139 139 141 142  K 5.5* 5.5* 5.1 4.5  CL 109 110 113* 114*  CO2 18* 16* 17* 18*  GLUCOSE 94 108* 119* 136*  BUN 54* 74* 78* 83*  CALCIUM 8.0* 7.5* 7.4* 7.1*  CREATININE 4.24* 5.32* 5.12* 5.39*  GFRNONAA 14* 10* 11* 10*  GFRAA 16* 12* 13* 12*       Electronically Signed: Murrell Redden, PA-C 07/09/2018, 10:39 AM   I spent a total of 15 minutes in face to face in clinical consultation, greater than 50% of which was counseling/coordinating care for venous access.

## 2018-07-09 NOTE — Progress Notes (Addendum)
PROGRESS NOTE                                                                                                                                                                                                             Patient Demographics:    Keith Young, is a 68 y.o. male, DOB - 09-11-1950, SHF:[id number]  Admit date - 07/06/2018   Admitting Physician Etta Quill, DO  Outpatient Primary MD for the patient is Keith Dad, MD  LOS - 2  Chief Complaint  Patient presents with  . Code Sepsis       Brief Narrative this is a 68 year old African-American gentleman with history of chronic systolic CHF EF 50% with AICD, CKD 4, DM type II, COPD, hypertension, history of CVA, history of HIV followed by ID in the outpatient setting who presented with high fevers, decreased mental status along with nausea vomiting on 07/06/2018, he was diagnosed with sepsis, ID, neurology and renal were consulted.  He was transferred to my care on 07/09/2018 on day 3 of his hospital stay.    Subjective:    Keith Young today remains obtunded in bed unable to answer questions or follow commands appears to be in no distress.   Assessment  & Plan :     1)  Sepsis due to E. coli bacteremia.  Source unclear.  Currently on Rocephin, also on his HIV medications along with acyclovir .  Neurology ID on board.  MRSA PCR negative.  Due for LP today, if all negative will evaluate abdomen and pelvis.  Left is a stable.  Continues to be obtunded.  No family available despite multiple attempts.  We will continue to monitor.   2)  ARF on CKD 4.  Baseline creatinine is around 2.5.  Nephrology following, may require dialysis this admission, requested IR to place HD catheter 07/09/2018 upon nephrology request..  3) Toxiccephalopathy with?  Seizure-like activity - EEG was negative, head CT negative, does appear to have mild nuchal rigidity, CT did show old infarcts, neurology on board.  Currently on IV acyclovir  along with Rocephin for empiric coverage, LP requested on 07/09/2018 by neurology.  Management of this problem deferred to neurology.  Continue supportive care.   4) HIV CD4 count above 200 -continue home meds.  5) History of chronic systolic CHF ejection fraction 30%/AICD -for now supportive care, cannot use ACE/ARB due to renal failure, blood pressure too low for beta-blocker or long-acting nitrate.  Fluid removal now appears to  be dependent upon HD which is to be initiated on 07/09/2018.     6) Essential hypertension.  Blood pressure too low for any blood pressure medications at this time.  7)  New onset A. fib RVR.  Mali vas 2 score will be at least greater than 4.  Goal will be rate control, echo requested, TSH stable, once LP is done will initiate heparin drip without bolus after LP likely late tomorrow.  Goal will be rate control.  Cardiology requested to evaluate as well.  8. DM2 - ISS  Lab Results  Component Value Date   HGBA1C 6.5 (H) 05/22/2018   CBG (last 3)  Recent Labs    07/08/18 2351 07/09/18 0330 07/09/18 0801  GLUCAP 93 109* 127*     Family Communication  :  None present  Code Status :  DNR  Disposition Plan  :  TBD  Consults  :  IR,Renal, Neuro, ID, cardiology  Procedures  :    CT head.  Nonacute Ultrasound.  Nonacute with evidence of chronic medical renal disease. IR requested to do LP on 07/09/2018 along with HD catheter placement  DVT Prophylaxis  :  Lovenox    Lab Results  Component Value Date   PLT 73 (L) 07/09/2018    Diet :  Diet Order            Diet NPO time specified  Diet effective midnight               Inpatient Medications Scheduled Meds: . bictegravir-emtricitabine-tenofovir AF  1 tablet Oral Daily  . Chlorhexidine Gluconate Cloth  6 each Topical Q0600  . digoxin  0.125 mg Intravenous Daily  . enoxaparin (LOVENOX) injection  30 mg Subcutaneous Q24H  . heparin      . insulin aspart  0-9 Units Subcutaneous Q4H  .  lidocaine       Continuous Infusions: . [START ON 07/10/2018] cefTRIAXone (ROCEPHIN)  IV    . diltiazem (CARDIZEM) infusion 15 mg/hr (07/09/18 0504)  . levETIRAcetam 500 mg (07/09/18 0328)   PRN Meds:.acetaminophen **OR** acetaminophen, metoprolol tartrate, ondansetron **OR** ondansetron (ZOFRAN) IV  Antibiotics  :   Anti-infectives (From admission, onward)   Start     Dose/Rate Route Frequency Ordered Stop   07/10/18 1430  vancomycin (VANCOCIN) IVPB 1000 mg/200 mL premix  Status:  Discontinued     1,000 mg 200 mL/hr over 60 Minutes Intravenous Every 48 hours 07/08/18 1421 07/09/18 0959   07/10/18 0847  cefTRIAXone (ROCEPHIN) 2 g in sodium chloride 0.9 % 100 mL IVPB     2 g 200 mL/hr over 30 Minutes Intravenous Every 24 hours 07/09/18 1004     07/09/18 1400  acyclovir (ZOVIRAX) 745 mg in dextrose 5 % 150 mL IVPB  Status:  Discontinued     10 mg/kg  74.5 kg 164.9 mL/hr over 60 Minutes Intravenous Every 24 hours 07/08/18 1421 07/09/18 0959   07/09/18 1300  bictegravir-emtricitabine-tenofovir AF (BIKTARVY) 50-200-25 MG per tablet 1 tablet     1 tablet Oral Daily 07/09/18 1012     07/08/18 2300  cefTRIAXone (ROCEPHIN) 2 g in sodium chloride 0.9 % 100 mL IVPB  Status:  Discontinued     2 g 200 mL/hr over 30 Minutes Intravenous Every 12 hours 07/08/18 1341 07/09/18 1004   07/08/18 1800  vancomycin (VANCOCIN) IVPB 1000 mg/200 mL premix  Status:  Discontinued     1,000 mg 200 mL/hr over 60 Minutes Intravenous Every 48 hours 07/06/18 2019 07/07/18  1137   07/08/18 1430  ampicillin (OMNIPEN) 2 g in sodium chloride 0.9 % 100 mL IVPB  Status:  Discontinued     2 g 300 mL/hr over 20 Minutes Intravenous Every 12 hours 07/08/18 1343 07/09/18 0959   07/08/18 1430  vancomycin (VANCOCIN) 1,500 mg in sodium chloride 0.9 % 500 mL IVPB     1,500 mg 250 mL/hr over 120 Minutes Intravenous  Once 07/08/18 1421 07/08/18 1723   07/08/18 1400  acyclovir (ZOVIRAX) 745 mg in dextrose 5 % 150 mL IVPB     10  mg/kg  74.5 kg 164.9 mL/hr over 60 Minutes Intravenous  Once 07/08/18 1345 07/08/18 1606   07/07/18 1800  ceFEPIme (MAXIPIME) 1 g in sodium chloride 0.9 % 100 mL IVPB  Status:  Discontinued     1 g 200 mL/hr over 30 Minutes Intravenous Every 24 hours 07/06/18 2019 07/07/18 1137   07/07/18 1145  cefTRIAXone (ROCEPHIN) 2 g in sodium chloride 0.9 % 100 mL IVPB  Status:  Discontinued     2 g 200 mL/hr over 30 Minutes Intravenous Every 24 hours 07/07/18 1137 07/08/18 1341   07/07/18 1000  bictegravir-emtricitabine-tenofovir AF (BIKTARVY) 50-200-25 MG per tablet 1 tablet  Status:  Discontinued     1 tablet Oral Daily 07/07/18 0058 07/07/18 1127   07/06/18 1900  ceFEPIme (MAXIPIME) 2 g in sodium chloride 0.9 % 100 mL IVPB     2 g 200 mL/hr over 30 Minutes Intravenous  Once 07/06/18 1859 07/06/18 2001   07/06/18 1900  vancomycin (VANCOCIN) IVPB 1000 mg/200 mL premix     1,000 mg 200 mL/hr over 60 Minutes Intravenous  Once 07/06/18 1859 07/06/18 2035          Objective:   Vitals:   07/09/18 0510 07/09/18 0550 07/09/18 0620 07/09/18 0833  BP: (!) 106/49 (!) 111/50 (!) 122/55 (!) 112/56  Pulse:    85  Resp: (!) 30 (!) 30 (!) 27 (!) 28  Temp:    99.6 F (37.6 C)  TempSrc:    Axillary  SpO2:    93%  Weight:      Height:        Wt Readings from Last 3 Encounters:  07/06/18 74.5 kg  07/03/18 74.5 kg  06/13/18 73.4 kg     Intake/Output Summary (Last 24 hours) at 07/09/2018 1201 Last data filed at 07/09/2018 0345 Gross per 24 hour  Intake 2157.58 ml  Output -  Net 2157.58 ml     Physical Exam  He is obtunded in bed unable to answer questions or follow commands, neck appears slightly stiff deviated to the left side, [place].AT,  Supple Neck,No JVD, No cervical lymphadenopathy appriciated.  Symmetrical Chest wall movement, Good air movement bilaterally, CTAB RRR,No Gallops,Rubs or new Murmurs, No Parasternal Heave +ve B.Sounds, Abd Soft, No tenderness, No organomegaly appriciated,  No rebound - guarding or rigidity. No Cyanosis, Clubbing or edema, No new Rash or bruise      Data Review:    CBC Recent Labs  Lab 07/04/18 0700 07/06/18 1908 07/07/18 0257 07/07/18 1139 07/08/18 0809 07/09/18 0437  WBC 3.7* 1.9* 10.3 19.7* 13.8* 16.0*  HGB 14.0 15.2 13.1 12.4* 13.1 12.5*  HCT 45.6 48.8 43.1 42.4 41.1 39.8  PLT 124* 107* 87* 80* 73* 73*  MCV 91.8 91.2 93.9 96.6 93.2 90.0  MCH 28.2 28.4 28.5 28.2 29.7 28.3  MCHC 30.7 31.1 30.4 29.2* 31.9 31.4  RDW 13.7 13.6 14.0 14.4 14.7 14.6  LYMPHSABS 0.6*  0.1*  --   --   --  0.2*  MONOABS 0.6 0.0*  --   --   --  0.5  EOSABS 0.2 0.0  --   --   --  0.3  BASOSABS 0.0 0.0  --   --   --  0.0    Chemistries  Recent Labs  Lab 07/06/18 1908 07/07/18 0257 07/07/18 1139 07/08/18 0809 07/08/18 1626 07/09/18 0437  NA 136 139 139 139 141 142  K 5.9* 5.6* 5.5* 5.5* 5.1 4.5  CL 105 106 109 110 113* 114*  CO2 20* 20* 18* 16* 17* 18*  GLUCOSE 136* 87 94 108* 119* 136*  BUN 43* 47* 54* 74* 78* 83*  CREATININE 3.01* 3.72* 4.24* 5.32* 5.12* 5.39*  CALCIUM 8.8* 7.9* 8.0* 7.5* 7.4* 7.1*  AST 70* 320* 161* 80*  --  57*  ALT 46* 216* 165* 117*  --  79*  ALKPHOS 102 93 84 78  --  78  BILITOT 0.9 1.0 1.1 0.9  --  0.8   ------------------------------------------------------------------------------------------------------------------ No results for input(s): CHOL, HDL, LDLCALC, TRIG, CHOLHDL, LDLDIRECT in the last 72 hours.  Lab Results  Component Value Date   HGBA1C 6.5 (H) 05/22/2018   ------------------------------------------------------------------------------------------------------------------ Recent Labs    07/09/18 0730  TSH 3.098   ------------------------------------------------------------------------------------------------------------------ No results for input(s): VITAMINB12, FOLATE, FERRITIN, TIBC, IRON, RETICCTPCT in the last 72 hours.  Coagulation profile No results for input(s): INR, PROTIME in the last  168 hours.  No results for input(s): DDIMER in the last 72 hours.  Cardiac Enzymes No results for input(s): CKMB, TROPONINI, MYOGLOBIN in the last 168 hours.  Invalid input(s): CK ------------------------------------------------------------------------------------------------------------------    Component Value Date/Time   BNP 554.5 (H) 07/06/2018 1918    Micro Results Recent Results (from the past 240 hour(s))  Blood Culture (routine x 2)     Status: None (Preliminary result)   Collection Time: 07/06/18  7:07 PM  Result Value Ref Range Status   Specimen Description BLOOD RIGHT FOREARM  Final   Special Requests   Final    BOTTLES DRAWN AEROBIC AND ANAEROBIC Blood Culture adequate volume   Culture  Setup Time   Final    GRAM NEGATIVE RODS ANAEROBIC BOTTLE ONLY CRITICAL VALUE NOTED.  VALUE IS CONSISTENT WITH PREVIOUSLY REPORTED AND CALLED VALUE. Performed at [facility], [place] [address], [place], [place] [address]    Culture GRAM NEGATIVE RODS  Final   Report Status PENDING  Incomplete  Blood Culture (routine x 2)     Status: Abnormal   Collection Time: 07/06/18  7:40 PM  Result Value Ref Range Status   Specimen Description BLOOD LEFT ANTECUBITAL  Final   Special Requests   Final    BOTTLES DRAWN AEROBIC AND ANAEROBIC Blood Culture adequate volume   Culture  Setup Time   Final    GRAM NEGATIVE RODS IN BOTH AEROBIC AND ANAEROBIC BOTTLES CRITICAL RESULT CALLED TO, READ BACK BY AND VERIFIED WITHRonnald Nian [id number] FCP Performed at [facility], [place] [address] [place], [place] [address]    Culture ESCHERICHIA COLI (A)  Final   Report Status 07/09/2018 FINAL  Final   Organism ID, Bacteria ESCHERICHIA COLI  Final      Susceptibility   Escherichia coli - MIC*    AMPICILLIN >=32 RESISTANT Resistant     CEFAZOLIN 16 SENSITIVE Sensitive     CEFEPIME <=1 SENSITIVE Sensitive     CEFTAZIDIME <=1 SENSITIVE Sensitive  CEFTRIAXONE <=1 SENSITIVE Sensitive      CIPROFLOXACIN <=0.25 SENSITIVE Sensitive     GENTAMICIN <=1 SENSITIVE Sensitive     IMIPENEM <=0.25 SENSITIVE Sensitive     TRIMETH/SULFA <=20 SENSITIVE Sensitive     AMPICILLIN/SULBACTAM >=32 RESISTANT Resistant     PIP/TAZO <=4 SENSITIVE Sensitive     Extended ESBL NEGATIVE Sensitive     * ESCHERICHIA COLI  Blood Culture ID Panel (Reflexed)     Status: Abnormal   Collection Time: 07/06/18  7:40 PM  Result Value Ref Range Status   Enterococcus species NOT DETECTED NOT DETECTED Final   Listeria monocytogenes NOT DETECTED NOT DETECTED Final   Staphylococcus species NOT DETECTED NOT DETECTED Final   Staphylococcus aureus (BCID) NOT DETECTED NOT DETECTED Final   Streptococcus species NOT DETECTED NOT DETECTED Final   Streptococcus agalactiae NOT DETECTED NOT DETECTED Final   Streptococcus pneumoniae NOT DETECTED NOT DETECTED Final   Streptococcus pyogenes NOT DETECTED NOT DETECTED Final   Acinetobacter baumannii NOT DETECTED NOT DETECTED Final   Enterobacteriaceae species DETECTED (A) NOT DETECTED Final    Comment: Enterobacteriaceae represent a large family of gram-negative bacteria, not a single organism. CRITICAL RESULT CALLED TO, READ BACK BY AND VERIFIED WITH: PHARMD MANCHIL, B [phone] FCP    Enterobacter cloacae complex NOT DETECTED NOT DETECTED Final   Escherichia coli DETECTED (A) NOT DETECTED Final    Comment: CRITICAL RESULT CALLED TO, READ BACK BY AND VERIFIED WITH: PHARMD MANCHIL, B [id number] FCP    Klebsiella oxytoca NOT DETECTED NOT DETECTED Final   Klebsiella pneumoniae NOT DETECTED NOT DETECTED Final   Proteus species NOT DETECTED NOT DETECTED Final   Serratia marcescens NOT DETECTED NOT DETECTED Final   Carbapenem resistance NOT DETECTED NOT DETECTED Final   Haemophilus influenzae NOT DETECTED NOT DETECTED Final   Neisseria meningitidis NOT DETECTED NOT DETECTED Final   Pseudomonas aeruginosa NOT DETECTED NOT DETECTED Final   Candida albicans NOT  DETECTED NOT DETECTED Final   Candida glabrata NOT DETECTED NOT DETECTED Final   Candida krusei NOT DETECTED NOT DETECTED Final   Candida parapsilosis NOT DETECTED NOT DETECTED Final   Candida tropicalis NOT DETECTED NOT DETECTED Final    Comment: Performed at [facility], [place] [address] [place], [place] [address]  MRSA PCR Screening     Status: None   Collection Time: 07/07/18  3:24 PM  Result Value Ref Range Status   MRSA by PCR NEGATIVE NEGATIVE Final    Comment:        The GeneXpert MRSA Assay (FDA approved for NASAL specimens only), is one component of a comprehensive MRSA colonization surveillance program. It is not intended to diagnose MRSA infection nor to guide or monitor treatment for MRSA infections. Performed at [facility], [place] [address], [place], [place] [address]   Culture, Urine     Status: None   Collection Time: 07/08/18  7:47 AM  Result Value Ref Range Status   Specimen Description URINE, RANDOM  Final   Special Requests NONE  Final   Culture   Final    NO GROWTH Performed at [facility], [place] [address] [place] [address], [place], [place] [address]    Report Status 07/09/2018 FINAL  Final    Radiology Reports Dg Chest 1 View  Result Date: 07/07/2018 CLINICAL DATA:  68 year old male with history of tachycardia. EXAM: CHEST  1 VIEW COMPARISON:  Chest x-ray 07/06/2018. FINDINGS: Status post median sternotomy. External defibrillator pad projecting over  the lower left hemithorax. Left-sided pacemaker/AICD with lead tip projecting over the expected location of the right ventricular apex. Lung volumes are slightly low. No acute consolidative airspace disease. Small left pleural effusion. No right pleural effusion. There is cephalization of the pulmonary vasculature and slight indistinctness of the interstitial markings suggestive of mild pulmonary edema. Mild cardiomegaly. The patient is rotated to the left on today's exam, resulting in distortion of the  mediastinal contours and reduced diagnostic sensitivity and specificity for mediastinal pathology. Aortic atherosclerosis. IMPRESSION: 1. Mild interstitial pulmonary edema and small left pleural effusion in the setting of mild cardiomegaly; imaging findings concerning for congestive heart failure. 2. Aortic atherosclerosis. 3. Postoperative changes and support apparatus, as above. Electronically Signed   By: Vinnie Langton M.D.   On: 07/07/2018 11:54   Ct Head Wo Contrast  Result Date: 07/06/2018 CLINICAL DATA:  Altered mental status. History of HIV, hypertension, hypercholesterolemia and diabetes. EXAM: CT HEAD WITHOUT CONTRAST TECHNIQUE: Contiguous axial images were obtained from the base of the skull through the vertex without intravenous contrast. COMPARISON:  CT HEAD July 21, 2016 FINDINGS: BRAIN: No intraparenchymal hemorrhage, mass effect, midline shift or acute large vascular territory infarcts. Old small RIGHT cerebellar and pontine infarcts. RIGHT temporal occipital encephalomalacia. New subcentimeter RIGHT internal capsule versus basal ganglia hypodensities. Old RIGHT basal ganglia infarct with mild ex vacuo dilatation RIGHT lateral ventricle. Old small bilateral thalami and LEFT basal ganglia infarcts. Confluent supratentorial white matter hypodensities. Moderate parenchymal brain volume loss. No hydrocephalus. VASCULAR: Moderate calcific atherosclerosis of the carotid siphons. SKULL: No skull fracture. No significant scalp soft tissue swelling. SINUSES/ORBITS: Mild paranasal sinus mucosal thickening. Chronic dehiscence of the RIGHT posterolateral maxillary wall. Mastoid air cells are well aerated.The included ocular globes and orbital contents are non-suspicious. OTHER: Bilateral parotid sialoliths. IMPRESSION: 1. New age indeterminate RIGHT basal ganglia small infarcts. 2. Old RIGHT temporal occipital/PCA territory infarct. 3. Old basal ganglia, thalami, pontine and cerebellar small infarcts.  Moderate to severe chronic small vessel ischemic changes. 4. Moderate parenchymal brain volume loss, advanced for age. Electronically Signed   By: Elon Alas M.D.   On: 07/06/2018 22:16   US Renal  Result Date: 07/07/2018 CLINICAL DATA:  Acute kidney injury, history cardiomyopathy, chronic systolic heart failure, stage III chronic kidney disease, hypertension, COPD, former smoker EXAM: RENAL / URINARY TRACT ULTRASOUND COMPLETE COMPARISON:  09/05/2016 FINDINGS: Right Kidney: Renal measurements: 10.9.7 x 6.1 cm = volume: 200 mL. Cortical thinning. Increased cortical echogenicity. Tiny mid renal cyst 8 x 6.9 mm. Additional peripelvic cyst 3.0 x 3.2 x 3.1 cm, simple features. No additional mass or hydronephrosis. No shadowing calculi. Left Kidney: Renal measurements: 12.3 x 6.6 x 6.6 cm = volume: 276 mL. Cortical thinning. Increased cortical echogenicity. Small cyst at inferior pole 1.0 x 0.9 x 1.2 cm. Additional small cyst at inferior pole 2.0 x 1.6 x 1.9 cm, simple features. No additional mass, hydronephrosis, or shadowing calcification. Bladder: Decompressed, unable to evaluate. IMPRESSION: Medical renal disease changes of both kidneys. Small renal cysts in both kidneys. No evidence of hydronephrosis. Electronically Signed   By: Lavonia Dana M.D.   On: 07/07/2018 19:30   Dg Chest Port 1 View  Result Date: 07/06/2018 CLINICAL DATA:  Sepsis EXAM: PORTABLE CHEST 1 VIEW COMPARISON:  Portable exam 2023 hours compared to 12/09/2016 FINDINGS: LEFT subclavian AICD with lead projecting over RIGHT ventricle. External pacing lead present. Enlargement of cardiac silhouette with postsurgical changes of CABG. Atherosclerotic calcification aorta. Mediastinal contours normal. Subsegmental atelectasis LEFT  lower lobe. Pulmonary vascular congestion with interstitial prominence at the mid to lower lungs which could represent minimal pulmonary edema. No pleural effusion or pneumothorax. Bones demineralized. IMPRESSION:  Enlargement of cardiac silhouette with vascular congestion and question minimal pulmonary edema. Electronically Signed   By: Lavonia Dana M.D.   On: 07/06/2018 20:35    Time Spent in minutes  30   Lala Lund M.D on 07/09/2018 at 12:01 PM  To page go to [email or web address] - password [facility]

## 2018-07-09 NOTE — Procedures (Signed)
Acute uremia  S/p RT IJ TEMP hd CATH  Tip svcra Ready for use Full report in pacs

## 2018-07-09 NOTE — Consult Note (Signed)
Cardiology Consultation:   Patient ID: ELMOR KOST; [id number]; 07/10/1950   Admit date: 07/06/2018 Date of Consult: 07/09/2018  Primary Care Provider: Virgie Dad, MD Primary Cardiologist: No primary care provider on file. Previously CHF team in 2016. Primary Electrophysiologist:  Cristopher Peru, MD  Chief Complaint: fever  Patient Profile:   Keith Young is a 68 y.o. male with a hx of HIV, DM, stroke, COPD, HTN, alcoholism (in remission), chronic systolic CHF, NICM, ICD, bioprosthetic AVR (unclear when), multiple myeloma, Bells palsy, noncompliance, NSVT, HLD, OSA, CKD IV, thrombocytopenia who is being seen today for the evaluation of reported atrial fib at the request of Dr. Candiss Norse.  History of Present Illness:   Remote CHF clinic notes were reviewed which report prior left heart cath in [place] in 2014 with no significant CAD. His EF is referenced at previously 15-20%. most recently 30-35%. Cause of cardiomyopathy has thought to be due to h/o ETOH abuse + HTN. He has a [place]. He also has a bioprosthetic aortic valve (placed in [place], not sure when). He had a CPX done in 8/15 that showed severe functional limitation, but RHC in 9/15 showed normal filling pressures and preserved cardiac output. Last echo 11/2016 showed EF 30-35% with multiple WMA, + grade 1 DD, normal valve leaflet excursion with possible mild prosthetic AS, normal RV, severe LAE, moderate RAE, mild TR. He has not been seen by CHF team since 2016. His last OV was in 05/2018 with Dr. Lovena Le at which time he was felt to be doing well.  At baseline he is in a SNF. He was brought to [facility] because of vomiting, decreased responsiveness, fever and hypotension with severe encephalopathy of unclear culprit. Workup has revealed lactic acidosis and sepsis, with brain imaging showing new age indeterminate right BG small infarct, old right temporal occipital/PCA territory infarct, old BG, thalami,  pontine and cerebellar small infarcts. He has also developed uremia with AKI requiring emergent placement of HD catheter. Nephrology recommended initiation of dialysis today due to possibility of uremic encephalopathy. He was started on empiric antibiotics for meningitis and is pending LP due to nuchal rigidity. EEG shows shows moderate to severe global slowing indicating moderate to severe global encephalopathy but no epileptiform discharges are observed. He is also being treated with IV fluids. Last Cr 5.39. Cultures have grown E Coli in the urine. C diff is also positive. UDS is negative. Procalcitonin >150. He is also noted to be more thrombocytopenic than previously (currently 76) although has been noted intermittently in the past. He remains obtunded and is unable to provide a history.  We are consulted for reported atrial fib. Unfortunately when he moved to dialysis all of his telemetry data got erased and there are no EKGs from these events. However, he presently appears to be possibly in rate controlled atrial flutter. 12 lead EKG has been ordered to confirm. He is on diltiazem drip. He was on digoxin but this was discontinued this morning. Digoxin level yesterday was 0.5. Blood culture + enterobacter, E Coli.  Past Medical History:  Diagnosis Date  . AICD (automatic cardioverter/defibrillator) present   . Alcoholism in remission ([place])   . Anemia   . Aortic insufficiency   . Arthritis   . At moderate risk for fall   . Bell's palsy   . Chronic systolic heart failure (HCC)    NYHA Class III  . CKD (chronic kidney disease), stage IV ([place])   . COPD (chronic obstructive  pulmonary disease) ([place])   . Essential hypertension, benign   . HIV disease ([place]) 09/06/2016  . Hyperlipidemia   . NICM (nonischemic cardiomyopathy) ([place])   . Noncompliance with medication regimen   . NSVT (nonsustained ventricular tachycardia) ([place])   . Numbness of right jaw    Had a stoke in 01/2013. Numbness is  occasional, especially when trying to chew.  . OSA (obstructive sleep apnea)   . Productive cough 08/2013   With brown sputum   . S/P aortic valve replacement with bioprosthetic valve   . Stroke ([place]) 01/2013   weakness of right side from CVA  . Type 2 diabetes mellitus ([place])   . Uses hearing aid 2014   recently received new hearing aids    Past Surgical History:  Procedure Laterality Date  . BIOPSY N/A 04/10/2014   Procedure: GASTRIC BIOPSY;  Surgeon: Daneil Dolin, MD;  Location: [facility] ORS;  Service: Endoscopy;  Laterality: N/A;  . BIOPSY  10/12/2015   Procedure: BIOPSY;  Surgeon: Daneil Dolin, MD;  Location: [facility] ENDO SUITE;  Service: Endoscopy;;  stomach bx's  . CARDIAC DEFIBRILLATOR PLACEMENT  11/12/12   [employer] Inogen MINI ICD implanted in [place] at [place] AVR per Dr Nelly Laurence note  . COLONOSCOPY WITH PROPOFOL N/A 04/10/2014   RMR: Colonic Diverticulosis  . ESOPHAGOGASTRODUODENOSCOPY (EGD) WITH PROPOFOL N/A 04/10/2014   RMR: Mild erosive reflux esophagitis. Multiple antral polyps likely hyperplastic status post removal by hot snare cautery technique. Diffusely abnormal stomach status post gastric biopsy I suspect some of patients anemaia may be due to intermittent oozing from the stomach. It would be difficult and a risky proposition to attempt complete removal of all of his gastric polyps.  . ESOPHAGOGASTRODUODENOSCOPY (EGD) WITH PROPOFOL N/A 10/12/2015   Procedure: ESOPHAGOGASTRODUODENOSCOPY (EGD) WITH PROPOFOL;  Surgeon: Daneil Dolin, MD;  Location: [facility] ENDO SU[facility];  Service: Endoscopy;  Laterality: N/A;  8469 - moved to 9:15  . IR FLUORO GUIDE CV LINE RIGHT  07/09/2018  . IR US GUIDE VASC ACCESS RIGHT  07/09/2018  . POLYPECTOMY N/A 04/10/2014   Procedure: GASTRIC POLYPECTOMY;  Surgeon: Daneil Dolin, MD;  Location: [facility] ORS;  Service: Endoscopy;  Laterality: N/A;  . RIGHT HEART CATHETERIZATION N/A 02/06/2014   Procedure:  RIGHT HEART CATH;  Surgeon: Larey Dresser, MD;  Location: [facility] CATH LAB;  Service: Cardiovascular;  Laterality: N/A;     Inpatient Medications: Scheduled Meds: . bictegravir-emtricitabine-tenofovir AF  1 tablet Oral Daily  . Chlorhexidine Gluconate Cloth  6 each Topical Q0600  . insulin aspart  0-9 Units Subcutaneous Q4H   Continuous Infusions: . sodium chloride    . sodium chloride    . [START ON 07/10/2018] cefTRIAXone (ROCEPHIN)  IV    . diltiazem (CARDIZEM) infusion 15 mg/hr (07/09/18 0504)  . levETIRAcetam 500 mg (07/09/18 1332)   PRN Meds: sodium chloride, sodium chloride, acetaminophen **OR** acetaminophen, alteplase, heparin, metoprolol tartrate, ondansetron **OR** ondansetron (ZOFRAN) IV  Home Meds: Prior to Admission medications   Medication Sig Start Date End Date Taking? Authorizing Provider  acetaminophen (TYLENOL) 325 MG tablet Take 2 tablets (650 mg total) by mouth every 4 (four) hours as needed for mild pain (or temp > 37.5 C (99.5 F)). 06/30/16  Yes Sinda Du, MD  aspirin 325 MG tablet Take 1 tablet (325 mg total) by mouth daily. 06/30/16  Yes Sinda Du, MD  atorvastatin (LIPITOR) 80 MG tablet Take 80 mg by mouth  daily.  11/22/16  Yes [provider]  bictegravir-emtricitabine-tenofovir AF (BIKTARVY) 50-200-25 MG TABS tablet Take 1 tablet by mouth daily. 04/18/18  Yes Comer, Okey Regal, MD  carvedilol (COREG) 3.125 MG tablet Take 1 tablet (3.125 mg total) by mouth 2 (two) times daily with a meal. 09/12/16  Yes Sinda Du, MD  Cholecalciferol (VITAMIN D3) 2000 UNITS TABS Take 2,000 mg by mouth daily.   Yes [provider]  coal tar (NEUTROGENA T-GEL) 0.5 % shampoo Apply to scalp on shower days (Twice a week) for dandruff and dry scalp Wed., and Sat.   Yes [provider]  digoxin (LANOXIN) 0.125 MG tablet TAKE 1/2 TABLET BY MOUTH DAILY 07/01/16  Yes Bensimhon, Shaune Pascal, MD  docusate sodium (COLACE) 100 MG capsule Take 100 mg by mouth  daily.   Yes [provider]  isosorbide mononitrate (IMDUR) 30 MG 24 hr tablet Take 30 mg by mouth daily.  01/17/18  Yes [provider]  Multiple Vitamin (MULTIVITAMIN WITH MINERALS) TABS tablet Take 1 tablet by mouth daily. 06/30/16  Yes Sinda Du, MD  ranitidine (ZANTAC) 150 MG tablet Take 150 mg by mouth at bedtime.   Yes [provider]  senna-docusate (SENEXON-S) 8.6-50 MG tablet Take 1 tablet by mouth daily.   Yes [provider]  sitaGLIPtin (JANUVIA) 50 MG tablet Take 50 mg by mouth daily.   Yes [provider]  tamsulosin (FLOMAX) 0.4 MG CAPS capsule Take 0.4 mg by mouth daily.   Yes [provider]    Allergies:   No Known Allergies  Social History:   Social History   Socioeconomic History  . Marital status: Single    Spouse name: Not on file  . Number of children: Not on file  . Years of education: Not on file  . Highest education level: Not on file  Occupational History  . Occupation: disabled  Social Needs  . Financial resource strain: Not hard at all  . Food insecurity:    Worry: Never true    Inability: Never true  . Transportation needs:    Medical: No    Non-medical: No  Tobacco Use  . Smoking status: Former Smoker    Types: Cigarettes    Last attempt to quit: 11/05/1993    Years since quitting: 24.6  . Smokeless tobacco: Never Used  Substance and Sexual Activity  . Alcohol use: Not Currently    Comment: former heavy ETOH, none recently  . Drug use: No    Comment: prior history of cocaine use, smoking   . Sexual activity: Yes    Birth control/protection: None  Lifestyle  . Physical activity:    Days per week: 4 days    Minutes per session: 20 min  . Stress: Not at all  Relationships  . Social connections:    Talks on phone: Never    Gets together: Once a week    Attends religious service: Never    Active member of club or organization: No    Attends meetings of clubs or organizations: Never     Relationship status: Never married  . Intimate partner violence:    Fear of current or ex partner: No    Emotionally abused: No    Physically abused: No    Forced sexual activity: No  Other Topics Concern  . Not on file  Social History Narrative   Recently moved from [place] to be with family in [place] (2015).  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  Has h/o polysubstance abuse.  Baseline - able to ambulate, mild cognitive delay, able to toilet and shower himself, occasional use of walker/cane.   2019- resident of [facility]. Unable to ambulate unassisted.             Family History:   The patient's family history includes Kidney disease in his mother and sister. There is no history of Colon cancer.  ROS:  Unable to obtain due to patient's lethargy  Physical Exam/Data:   Vitals:   07/09/18 0833 07/09/18 1323 07/09/18 1509 07/09/18 1530  BP: (!) 112/56 123/67 (!) (P) 82/61 (P) 128/64  Pulse: 85 78 (P) 74 (P) 69  Resp: (!) 28 (!) 24 (!) (P) 24   Temp: 99.6 F (37.6 C) (P) 98.6 F (37 C) (P) 98.6 F (37 C)   TempSrc: Axillary (P) Oral (P) Axillary   SpO2: 93% 93% (P) 99%   Weight:      Height:        Intake/Output Summary (Last 24 hours) at 07/09/2018 1607 Last data filed at 07/09/2018 1327 Gross per 24 hour  Intake 1307.58 ml  Output 445 ml  Net 862.58 ml   Last 3 Weights 07/06/2018 07/03/2018 06/13/2018  Weight (lbs) 164 lb 3.2 oz 164 lb 3.2 oz 161 lb 12.8 oz  Weight (kg) 74.481 kg 74.481 kg 73.392 kg    Body mass index is 29.09 kg/m.  General: Debilitated appearing obtunded AAM in no acute distress. Head: Normocephalic, atraumatic, sclera non-icteric, no xanthomas, nares are without discharge.  Neck: JVD not elevated. Lungs: Coarse BS bilaterally to auscultation without wheezes, rales, or rhonchi. Breathing is unlabored. Heart: RRR with S1 S2. No murmurs, rubs, or gallops appreciated. Abdomen: Soft, non-tender, non-distended with  normoactive bowel sounds. No hepatomegaly. No rebound/guarding. No obvious abdominal masses. Msk:  Strength and tone appear normal for age. Extremities: No clubbing or cyanosis. No edema.  Distal pedal pulses are 2+ and equal bilaterally. Neuro: Obtunded, does not respond to voice. Head positioned to left side Psych:  Unable to assess, obtunded, does not respond  EKG:  The EKG was personally reviewed and demonstrates sinus tach 108bpm, LAFB, diffuse TWI I, avL, V4-V6  Laboratory Data:  Chemistry Recent Labs  Lab 07/08/18 0809 07/08/18 1626 07/09/18 0437  NA 139 141 142  K 5.5* 5.1 4.5  CL 110 113* 114*  CO2 16* 17* 18*  GLUCOSE 108* 119* 136*  BUN 74* 78* 83*  CREATININE 5.32* 5.12* 5.39*  CALCIUM 7.5* 7.4* 7.1*  GFRNONAA 10* 11* 10*  GFRAA 12* 13* 12*  ANIONGAP '13 11 10    ' Recent Labs  Lab 07/07/18 1139 07/08/18 0809 07/09/18 0437  PROT 6.1* 6.1* 5.9*  ALBUMIN 2.5* 2.3* 2.1*  AST 161* 80* 57*  ALT 165* 117* 79*  ALKPHOS 84 78 78  BILITOT 1.1 0.9 0.8   Hematology Recent Labs  Lab 07/07/18 1139 07/08/18 0809 07/09/18 0437  WBC 19.7* 13.8* 16.0*  RBC 4.39 4.41 4.42  HGB 12.4* 13.1 12.5*  HCT 42.4 41.1 39.8  MCV 96.6 93.2 90.0  MCH 28.2 29.7 28.3  MCHC 29.2* 31.9 31.4  RDW 14.4 14.7 14.6  PLT 80* 73* 73*   Cardiac EnzymesNo results for input(s): TROPONINI in the last 168 hours. No results for input(s): TROPIPOC in the last 168 hours.  BNP Recent Labs  Lab 07/04/18 0700 07/06/18 1918  BNP 697.0* 554.5*    DDimer No results for input(s): DDIMER in  the last 168 hours.  Radiology/Studies:  Dg Chest 1 View  Result Date: 07/07/2018 CLINICAL DATA:  68 year old male with history of tachycardia. EXAM: CHEST  1 VIEW COMPARISON:  Chest x-ray 07/06/2018. FINDINGS: Status post median sternotomy. External defibrillator pad projecting over the lower left hemithorax. Left-sided pacemaker/AICD with lead tip projecting over the expected location of the right ventricular  apex. Lung volumes are slightly low. No acute consolidative airspace disease. Small left pleural effusion. No right pleural effusion. There is cephalization of the pulmonary vasculature and slight indistinctness of the interstitial markings suggestive of mild pulmonary edema. Mild cardiomegaly. The patient is rotated to the left on today's exam, resulting in distortion of the mediastinal contours and reduced diagnostic sensitivity and specificity for mediastinal pathology. Aortic atherosclerosis. IMPRESSION: 1. Mild interstitial pulmonary edema and small left pleural effusion in the setting of mild cardiomegaly; imaging findings concerning for congestive heart failure. 2. Aortic atherosclerosis. 3. Postoperative changes and support apparatus, as above. Electronically Signed   By: Vinnie Langton M.D.   On: 07/07/2018 11:54   Ct Head Wo Contrast  Result Date: 07/06/2018 CLINICAL DATA:  Altered mental status. History of HIV, hypertension, hypercholesterolemia and diabetes. EXAM: CT HEAD WITHOUT CONTRAST TECHNIQUE: Contiguous axial images were obtained from the base of the skull through the vertex without intravenous contrast. COMPARISON:  CT HEAD July 21, 2016 FINDINGS: BRAIN: No intraparenchymal hemorrhage, mass effect, midline shift or acute large vascular territory infarcts. Old small RIGHT cerebellar and pontine infarcts. RIGHT temporal occipital encephalomalacia. New subcentimeter RIGHT internal capsule versus basal ganglia hypodensities. Old RIGHT basal ganglia infarct with mild ex vacuo dilatation RIGHT lateral ventricle. Old small bilateral thalami and LEFT basal ganglia infarcts. Confluent supratentorial white matter hypodensities. Moderate parenchymal brain volume loss. No hydrocephalus. VASCULAR: Moderate calcific atherosclerosis of the carotid siphons. SKULL: No skull fracture. No significant scalp soft tissue swelling. SINUSES/ORBITS: Mild paranasal sinus mucosal thickening. Chronic dehiscence of  the RIGHT posterolateral maxillary wall. Mastoid air cells are well aerated.The included ocular globes and orbital contents are non-suspicious. OTHER: Bilateral parotid sialoliths. IMPRESSION: 1. New age indeterminate RIGHT basal ganglia small infarcts. 2. Old RIGHT temporal occipital/PCA territory infarct. 3. Old basal ganglia, thalami, pontine and cerebellar small infarcts. Moderate to severe chronic small vessel ischemic changes. 4. Moderate parenchymal brain volume loss, advanced for age. Electronically Signed   By: Elon Alas M.D.   On: 07/06/2018 22:16   US Renal  Result Date: 07/07/2018 CLINICAL DATA:  Acute kidney injury, history cardiomyopathy, chronic systolic heart failure, stage III chronic kidney disease, hypertension, COPD, former smoker EXAM: RENAL / URINARY TRACT ULTRASOUND COMPLETE COMPARISON:  09/05/2016 FINDINGS: Right Kidney: Renal measurements: 10.9.7 x 6.1 cm = volume: 200 mL. Cortical thinning. Increased cortical echogenicity. Tiny mid renal cyst 8 x 6.9 mm. Additional peripelvic cyst 3.0 x 3.2 x 3.1 cm, simple features. No additional mass or hydronephrosis. No shadowing calculi. Left Kidney: Renal measurements: 12.3 x 6.6 x 6.6 cm = volume: 276 mL. Cortical thinning. Increased cortical echogenicity. Small cyst at inferior pole 1.0 x 0.9 x 1.2 cm. Additional small cyst at inferior pole 2.0 x 1.6 x 1.9 cm, simple features. No additional mass, hydronephrosis, or shadowing calcification. Bladder: Decompressed, unable to evaluate. IMPRESSION: Medical renal disease changes of both kidneys. Small renal cysts in both kidneys. No evidence of hydronephrosis. Electronically Signed   By: Lavonia Dana M.D.   On: 07/07/2018 19:30   Ir Fluoro Guide Cv Line Right  Result Date: 07/09/2018 INDICATION: Acute uremia, acute kidney  injury, no current access for dialysis EXAM: Ultrasound guidance for vascular access Right IJ temporary dialysis catheter (Mahurkar catheter) MEDICATIONS: 1% lidocaine  local ANESTHESIA/SEDATION: Moderate Sedation Time: None. The patient's level of consciousness and vital signs were monitored continuously by radiology nursing throughout the procedure under my direct supervision. FLUOROSCOPY TIME:  Fluoroscopy Time: 0 minutes 24 seconds (2 mGy). COMPLICATIONS: None immediate. PROCEDURE: Emergency consent was obtained from the patient's physician after a thorough discussion of the procedural risks, benefits and alternatives. All questions were addressed. Maximal Sterile Barrier Technique was utilized including caps, mask, sterile gowns, sterile gloves, sterile drape, hand hygiene and skin antiseptic. A timeout was performed prior to the initiation of the procedure. Under sterile conditions and local anesthesia, ultrasound micropuncture access performed of the right internal jugular vein. Images obtained for documentation of the patent jugular vein. Guidewire advanced easily. Measurements obtained. Four [place] dilator advanced. Amplatz guidewire advanced into the IVC. Tract dilatation performed to insert a temporary dialysis catheter. Tip position at the SVC RA junction. Images obtained for documentation. Catheter secured Prolene sutures. Blood aspirated easily followed by saline and heparin flushes. Appropriate volume and strength of heparin instilled in all lumens. External caps applied. Catheter secured with Prolene sutures and a sterile dressing. No immediate complication. Patient tolerated the procedure well. IMPRESSION: Successful ultrasound and fluoroscopic right IJ temporary dialysis catheter. Tip SVC RA junction. Ready for use. Electronically Signed   By: Jerilynn Mages.  Shick M.D.   On: 07/09/2018 13:06   Ir US Guide Vasc Access Right  Result Date: 07/09/2018 INDICATION: Acute uremia, acute kidney injury, no current access for dialysis EXAM: Ultrasound guidance for vascular access Right IJ temporary dialysis catheter (Mahurkar catheter) MEDICATIONS: 1% lidocaine local  ANESTHESIA/SEDATION: Moderate Sedation Time: None. The patient's level of consciousness and vital signs were monitored continuously by radiology nursing throughout the procedure under my direct supervision. FLUOROSCOPY TIME:  Fluoroscopy Time: 0 minutes 24 seconds (2 mGy). COMPLICATIONS: None immediate. PROCEDURE: Emergency consent was obtained from the patient's physician after a thorough discussion of the procedural risks, benefits and alternatives. All questions were addressed. Maximal Sterile Barrier Technique was utilized including caps, mask, sterile gowns, sterile gloves, sterile drape, hand hygiene and skin antiseptic. A timeout was performed prior to the initiation of the procedure. Under sterile conditions and local anesthesia, ultrasound micropuncture access performed of the right internal jugular vein. Images obtained for documentation of the patent jugular vein. Guidewire advanced easily. Measurements obtained. Four [place] dilator advanced. Amplatz guidewire advanced into the IVC. Tract dilatation performed to insert a temporary dialysis catheter. Tip position at the SVC RA junction. Images obtained for documentation. Catheter secured Prolene sutures. Blood aspirated easily followed by saline and heparin flushes. Appropriate volume and strength of heparin instilled in all lumens. External caps applied. Catheter secured with Prolene sutures and a sterile dressing. No immediate complication. Patient tolerated the procedure well. IMPRESSION: Successful ultrasound and fluoroscopic right IJ temporary dialysis catheter. Tip SVC RA junction. Ready for use. Electronically Signed   By: Jerilynn Mages.  Shick M.D.   On: 07/09/2018 13:06   Dg Chest Port 1 View  Result Date: 07/06/2018 CLINICAL DATA:  Sepsis EXAM: PORTABLE CHEST 1 VIEW COMPARISON:  Portable exam 2023 hours compared to 12/09/2016 FINDINGS: LEFT subclavian AICD with lead projecting over RIGHT ventricle. External pacing lead present. Enlargement of cardiac  silhouette with postsurgical changes of CABG. Atherosclerotic calcification aorta. Mediastinal contours normal. Subsegmental atelectasis LEFT lower lobe. Pulmonary vascular congestion with interstitial prominence at the mid to lower lungs which  could represent minimal pulmonary edema. No pleural effusion or pneumothorax. Bones demineralized. IMPRESSION: Enlargement of cardiac silhouette with vascular congestion and question minimal pulmonary edema. Electronically Signed   By: Lavonia Dana M.D.   On: 07/06/2018 20:35    Assessment and Plan:   1. Fever/sepsis - nephro, neuro, IM on board. Question whether the patient would require TEE when more stable given his h/o bioprosthetic AVR and ICD. Continue plan for echo as outlined.  2. Acute encephalopathy - remains obtunded. Sounds like there have been issues trying to reach family for consent as well.  3. Reported atrial fibrillation this admission, now with atrial flutter with what appears to be 4:1 conduction - plan EKG to confirm. Thrombocytopenia has been noted. Heparin per pharmacy was initially ordered by IM to start tomorrow without bolus, but order was discontinued per pharmD discussion with MD. CHADSVASC is 5 but with thrombocytopenia, AMS of unclear etiology, and impending LP, will need to discuss initiation with MD. His AF does not seem to be acutely impacting him hemodynamically. He remains with diltiazem drip ordered; consider switching over to metoprolol IV scheduled given his LV dysfunction.  4. AKI on CKD IV - now receiving HD.  5. Chronic systolic CHF - volume status being managed by dialysis. Avoid ACEI/ARB/spiro/ARNI given AKI.  For questions or updates, please contact [place] Please consult [email or web address] for contact info under Cardiology/STEMI.   Signed, Charlie Pitter, PA-C  07/09/2018 4:07 PM

## 2018-07-09 NOTE — Consult Note (Signed)
Notified by CCMD that pt had a 5 beat run of V-tach. Assesed pt. Pt continues to rest in bed. Will continue to monitor.

## 2018-07-09 NOTE — Progress Notes (Signed)
Messaged pharmacy concerning cardizem medication with no response. Spoke with pharmacy and notified that they will send medicine shortly.

## 2018-07-09 NOTE — Progress Notes (Signed)
Reason for consult: Encephalopathy  Subjective: Patient lying in bed.  Appears lethargic.   ROS:  Unable to obtain due to poor mental status  Examination  Vital signs in last 24 hours: Temp:  [99.1 F (37.3 C)-99.9 F (37.7 C)] 99.6 F (37.6 C) (02/10 0833) Pulse Rate:  [85] 85 (02/10 0833) Resp:  [17-45] 28 (02/10 0833) BP: (95-139)/(49-118) 112/56 (02/10 0833) SpO2:  [92 %-95 %] 93 % (02/10 0833)  General: lying in bed CVS: pulse-normal rate and rhythm RS: breathing comfortably Extremities: normal   Neuro: MS: Lethargic, not following any commands CN: pupils equal and reactive, no forced gaze deviation or nystagmus, face symmetric, tongue midline Motor: withdraws less over left upper extremity compared to right upper extremity, withdraws in both lower extremities.  Asterixis in bilateral upper extremities Reflexes:  plantars: Extensor bilaterally Coordination: Unable to assess Gait: not tested  Basic Metabolic Panel: Recent Labs  Lab 07/07/18 0257 07/07/18 1139 07/08/18 0809 07/08/18 1626 07/09/18 0437  NA 139 139 139 141 142  K 5.6* 5.5* 5.5* 5.1 4.5  CL 106 109 110 113* 114*  CO2 20* 18* 16* 17* 18*  GLUCOSE 87 94 108* 119* 136*  BUN 47* 54* 74* 78* 83*  CREATININE 3.72* 4.24* 5.32* 5.12* 5.39*  CALCIUM 7.9* 8.0* 7.5* 7.4* 7.1*    CBC: Recent Labs  Lab 07/04/18 0700 07/06/18 1908 07/07/18 0257 07/07/18 1139 07/08/18 0809 07/09/18 0437  WBC 3.7* 1.9* 10.3 19.7* 13.8* 16.0*  NEUTROABS 2.2 1.7  --   --   --  14.9*  HGB 14.0 15.2 13.1 12.4* 13.1 12.5*  HCT 45.6 48.8 43.1 42.4 41.1 39.8  MCV 91.8 91.2 93.9 96.6 93.2 90.0  PLT 124* 107* 87* 80* 73* 73*     Coagulation Studies: No results for input(s): LABPROT, INR in the last 72 hours.  Imaging Reviewed: CT head    ASSESSMENT AND PLAN  68 year old male with past medical history of diabetes, CVA, HIV, hypertension, AICD placement, CKD stage III admitted to [facility] for increased  lethargy, fever and hypotension.  Also noted to have myoclonus and neurology was consulted. Work-up so far reveals UTI with E. coli as well as elevated BUN.  He is also noted to have neck stiffness on examination and lumbar puncture is pending.  CT head unremarkable for acute pathology (MRI brain unable to be performed due to AICD).  EEG was performed ruled out nonconvulsive status epilepticus.    Acute Metabolic/uremic encephalopathy  Urosepsis  Suspected meningitis  Recommendations -Lumbar puncture to be completed under fluoroscopy, follow-up on results.  Continue broad-spectrum antibiotics and acyclovir -Continue Keppra for now, can likely be discontinued once meningitis/sepsis improves.  Usual light activity likely negative myoclonus in the setting of sepsis and uremia.   LP still pending at 6:55 PM.  Will sign out to night team to follow-up on results    [place] Pager Number [phone] For questions after 7pm please refer to AMION to reach the Neurologist on call

## 2018-07-09 NOTE — Progress Notes (Signed)
[facility]    NEPHROLOGY PROGRESS NOTE  SUBJECTIVE: Blood pressure stable.  Some urine output noted.  Continues to have ongoing tremors.  Patient remains lethargic and unable to provide any review of systems.     OBJECTIVE:  Vitals:   07/09/18 0620 07/09/18 0833  BP: (!) 122/55 (!) 112/56  Pulse:  85  Resp: (!) 27 (!) 28  Temp:  99.6 F (37.6 C)  SpO2:  93%    Intake/Output Summary (Last 24 hours) at 07/09/2018 0934 Last data filed at 07/09/2018 0345 Gross per 24 hour  Intake 2257.58 ml  Output -  Net 2257.58 ml      Genearl: Lethargic, does not follow commands, jerking movements noted to the face and right arm HEENT: MMM  AT anicteric sclera Neck:  No JVD, no adenopathy CV:  Heart RRR  Lungs:  L/S CTA bilaterally Abd:  abd SNT/ND with normal BS GU:  Bladder non-palpable, Foley catheter in place Extremities:  No LE edema. Skin:  No skin rash  MEDICATIONS:  . digoxin  0.125 mg Intravenous Daily  . enoxaparin (LOVENOX) injection  30 mg Subcutaneous Q24H  . insulin aspart  0-9 Units Subcutaneous Q4H       LABS:   CBC Latest Ref Rng & Units 07/09/2018 07/08/2018 07/07/2018  WBC 4.0 - 10.5 K/uL 16.0(H) 13.8(H) 19.7(H)  Hemoglobin 13.0 - 17.0 g/dL 12.5(L) 13.1 12.4(L)  Hematocrit 39.0 - 52.0 % 39.8 41.1 42.4  Platelets 150 - 400 K/uL 73(L) 73(L) 80(L)    CMP Latest Ref Rng & Units 07/09/2018 07/08/2018 07/08/2018  Glucose 70 - 99 mg/dL 136(H) 119(H) 108(H)  BUN 8 - 23 mg/dL 83(H) 78(H) 74(H)  Creatinine 0.61 - 1.24 mg/dL 5.39(H) 5.12(H) 5.32(H)  Sodium 135 - 145 mmol/L 142 141 139  Potassium 3.5 - 5.1 mmol/L 4.5 5.1 5.5(H)  Chloride 98 - 111 mmol/L 114(H) 113(H) 110  CO2 22 - 32 mmol/L 18(L) 17(L) 16(L)  Calcium 8.9 - 10.3 mg/dL 7.1(L) 7.4(L) 7.5(L)  Total Protein 6.5 - 8.1 g/dL 5.9(L) - 6.1(L)  Total Bilirubin 0.3 - 1.2 mg/dL 0.8 - 0.9  Alkaline Phos 38 - 126 U/L 78 - 78  AST 15 - 41 U/L 57(H) - 80(H)  ALT 0 - 44 U/L 79(H) - 117(H)    Lab  Results  Component Value Date   PTH 52 04/17/2018   CALCIUM 7.1 (L) 07/09/2018   CAION 1.27 (H) 11/28/2015   PHOS 3.4 04/17/2018       Component Value Date/Time   COLORURINE AMBER (A) 07/08/2018 0839   APPEARANCEUR CLOUDY (A) 07/08/2018 0839   LABSPEC 1.016 07/08/2018 0839   PHURINE 5.0 07/08/2018 0839   GLUCOSEU NEGATIVE 07/08/2018 0839   HGBUR SMALL (A) 07/08/2018 0839   BILIRUBINUR NEGATIVE 07/08/2018 0839   KETONESUR 5 (A) 07/08/2018 0839   PROTEINUR 100 (A) 07/08/2018 0839   UROBILINOGEN 0.2 10/23/2013 1545   NITRITE NEGATIVE 07/08/2018 0839   LEUKOCYTESUR LARGE (A) 07/08/2018 0839      Component Value Date/Time   PHART 7.346 (L) 07/08/2018 1235   PCO2ART 32.0 07/08/2018 1235   PO2ART 86.4 07/08/2018 1235   HCO3 17.0 (L) 07/08/2018 1235   TCO2 25 11/28/2015 1304   ACIDBASEDEF 7.5 (H) 07/08/2018 1235   O2SAT 95.9 07/08/2018 1235       Component Value Date/Time   IRON 40 (L) 04/17/2018 0734   TIBC 260 04/17/2018 0734   FERRITIN 39 04/17/2018 0734   IRONPCTSAT 15 (L) 04/17/2018 3734  ASSESSMENT/PLAN:      1.  Chronic kidney disease stage IV with a baseline serum creatinine around 2.4.  I suspect his chronic kidney disease on the basis of longstanding hypertension, diabetes, atherosclerotic cardiovascular disease, and possibly HIV.  2.  Acute kidney injury.  I suspect this is on the basis of sepsis and decreased renal perfusion.   His urine output is slowly picking up.    Given the possibility of uremic encephalopathy, will plan for dialysis today.  Attempted to call the sister for consent.  No return call.  We will plan dialysis catheter placement and hemodialysis initiation based on urgent need.  3.  Sepsis.  He has received appropriate fluid bolus.  Continue antibiotics.  Cultures are growing E. coli in the urine.    Lactate is trending down.  Will check C. difficile.  4.  Metabolic encephalopathy.  Likely secondary to sepsis.    Plan dialysis  today.  5.  Hyperkalemia.  This is mild and improving.    [place], DO, [place]

## 2018-07-09 NOTE — Progress Notes (Signed)
Pt HR sustaining around 110s. Pt titrated to max dose of 15 mg/hr. Pt does not seem to be in any distress. No other acute changes. MD notified. Will continue to monitor.

## 2018-07-09 NOTE — Consult Note (Addendum)
Date of Admission:  07/06/2018          Reason for Consult: Encephalopathy    Referring Provider:Dr. Sloan Leiter   Assessment:  1. Encephalopathy due to 2. E. coli bacteremia with sepsis and 3. Acute on chronic kidney disease with uremia with need for dialysis 4. ICD in place 5. C. difficile positive stool regimen PCR though not clear he is having diarrhea 6. Atrial fibrillation with rapid ventricular response 7. Well-controlled HIV on Biktarvy  Plan:  1. Narrowed to ceftriaxone initially, would go even narrow to cefazolin if he is improving his encephalopathy 2. Repeat blood cultures 3. 2 D echocardiogram 4. We do need to understand what the focus and source of his infection is urine was negative.  May need to image the abdomen with a CT when capable 5. In presence of ICD will get a 2D echocardiogram and would at minimum treat for 2 weeks with systemic antibiotics balancing this against the risk of C. difficile colitis 6. Agree he needs dialysis 7. Control atrial fibrillation 8. Monitor for multiple loose stools and if present initiate oral vancomycin in the interim institute enteric precautions 9. Restart Biktarvy when able to take pills  Principal Problem:   Septic shock ([place]) Active Problems:   Chronic kidney disease (CKD), active medical management without dialysis, stage 3 (moderate) ([place])   Diabetes mellitus type 2 in nonobese [facility]   Essential hypertension, benign   ICD (implantable cardioverter-defibrillator) in place   Acute kidney injury ([place])   HIV disease ([place])   Chronic combined systolic and diastolic heart failure ([place])   Acute metabolic encephalopathy   Encephalitis   Scheduled Meds: . bictegravir-emtricitabine-tenofovir AF  1 tablet Oral Daily  . Chlorhexidine Gluconate Cloth  6 each Topical Q0600  . insulin aspart  0-9 Units Subcutaneous Q4H  . metoprolol tartrate  2.5 mg Intravenous Q8H   Continuous Infusions: . sodium chloride    . sodium  chloride    . [START ON 07/10/2018] cefTRIAXone (ROCEPHIN)  IV    . levETIRAcetam 500 mg (07/09/18 1332)   PRN Meds:.sodium chloride, sodium chloride, acetaminophen **OR** acetaminophen, alteplase, heparin, metoprolol tartrate, ondansetron **OR** ondansetron (ZOFRAN) IV  HPI: Keith Young is a 68 y.o. male HIV acute on chronic kidney disease who is admitted with fever and encephalopathy.  Is been residing in a skilled nursing facility was brought in when he is becoming increasingly lethargic and hypotensive.  The ER he was hypotensive and shocky with blood pressures in the 60s with elevated lactic acid.  He was given intravenous fluids blood cultures were drawn he was admitted the hospitalist service was also concern for meningoencephalitis, he was started on vancomycin and ceftriaxone ampicillin and acyclovir.  Blood puncture was contemplated but could not be done.  In the interim his blood cultures have turned positive for E. coli.  His renal failure has worsened and he is at the time of this note had placement of a catheter and is undergoing hemodialysis.  His C. difficile antigen is positive toxin negative and PCR positive though I do not clearly see that he is having multiple loose bowel movements.  This may be colonization rather than true C. difficile colitis.  His HIV has been historically very well controlled on Biktarvy.  We initially narrowed to ceftriaxone treat his E. coli bacteremia if there was some concern about the E. coli seeding the central nervous system.  This seems incredibly unlikely as E. coli is not a common organism  to cause central nervous system infection in those who have not had CNS operations or instrumentation.  I would prefer to narrow him to cefazolin soon as possible to avoid causing C. difficile flare now we know he is also colonized at the minimum with C. difficile.  Hopefully his mental status will improve with treating his septic shock with antibiotics and  correcting his electrolyte abnormalities and uremia with hemodialysis.  Once he is able to take p.o. he should be   back on his Biktarvy.   Review of Systems: Review of Systems  Unable to perform ROS: Acuity of condition    Past Medical History:  Diagnosis Date  . AICD (automatic cardioverter/defibrillator) present   . Alcoholism in remission ([place])   . Anemia   . Aortic insufficiency   . Arthritis   . At moderate risk for fall   . Bell's palsy   . Chronic systolic heart failure (HCC)    NYHA Class III  . CKD (chronic kidney disease), stage IV ([place])   . COPD (chronic obstructive pulmonary disease) ([place])   . Essential hypertension, benign   . HIV disease ([place]) 09/06/2016  . Hyperlipidemia   . NICM (nonischemic cardiomyopathy) ([place])   . Noncompliance with medication regimen   . NSVT (nonsustained ventricular tachycardia) ([place])   . Numbness of right jaw    Had a stoke in 01/2013. Numbness is occasional, especially when trying to chew.  . OSA (obstructive sleep apnea)   . Productive cough 08/2013   With brown sputum   . S/P aortic valve replacement with bioprosthetic valve   . Stroke ([place]) 01/2013   weakness of right side from CVA  . Type 2 diabetes mellitus ([place])   . Uses hearing aid 2014   recently received new hearing aids    Social History   Tobacco Use  . Smoking status: Former Smoker    Types: Cigarettes    Last attempt to quit: 11/05/1993    Years since quitting: 24.6  . Smokeless tobacco: Never Used  Substance Use Topics  . Alcohol use: Not Currently    Comment: former heavy ETOH, none recently  . Drug use: No    Comment: prior history of cocaine use, smoking     Family History  Problem Relation Age of Onset  . Kidney disease Mother   . Kidney disease Sister   . Colon cancer Neg Hx    No Known Allergies  OBJECTIVE: Blood pressure 122/66, pulse 80, temperature 98.6 F (37 C), temperature source Axillary, resp. rate (!) 24, height 5\' 3"  (1.6 m), weight  78 kg, SpO2 99 %.  Physical Exam Constitutional:      Appearance: He is ill-appearing.  HENT:     Head: Normocephalic and atraumatic.  Eyes:     Extraocular Movements: Extraocular movements intact.     Conjunctiva/sclera: Conjunctivae normal.  Cardiovascular:     Rate and Rhythm: Tachycardia present. Rhythm irregular.  Pulmonary:     Effort: Pulmonary effort is normal. No respiratory distress.     Breath sounds: Normal breath sounds. No stridor. No wheezing or rhonchi.  Abdominal:     General: Bowel sounds are normal. There is no distension.     Palpations: There is no mass.  Musculoskeletal: Normal range of motion.   Patient was obtunded and cannot follow commands  Lab Results Lab Results  Component Value Date   WBC 16.0 (H) 07/09/2018   HGB 12.5 (L) 07/09/2018   HCT 39.8 07/09/2018   MCV  90.0 07/09/2018   PLT 73 (L) 07/09/2018    Lab Results  Component Value Date   CREATININE 5.39 (H) 07/09/2018   BUN 83 (H) 07/09/2018   NA 142 07/09/2018   K 4.5 07/09/2018   CL 114 (H) 07/09/2018   CO2 18 (L) 07/09/2018    Lab Results  Component Value Date   ALT 79 (H) 07/09/2018   AST 57 (H) 07/09/2018   ALKPHOS 78 07/09/2018   BILITOT 0.8 07/09/2018     Microbiology: Recent Results (from the past 240 hour(s))  Blood Culture (routine x 2)     Status: None (Preliminary result)   Collection Time: 07/06/18  7:07 PM  Result Value Ref Range Status   Specimen Description BLOOD RIGHT FOREARM  Final   Special Requests   Final    BOTTLES DRAWN AEROBIC AND ANAEROBIC Blood Culture adequate volume   Culture  Setup Time   Final    GRAM NEGATIVE RODS ANAEROBIC BOTTLE ONLY CRITICAL VALUE NOTED.  VALUE IS CONSISTENT WITH PREVIOUSLY REPORTED AND CALLED VALUE. Performed at [facility], [place] [address] [place], [place] [address]    Culture GRAM NEGATIVE RODS  Final   Report Status PENDING  Incomplete  Blood Culture (routine x 2)     Status: Abnormal   Collection Time: 07/06/18   7:40 PM  Result Value Ref Range Status   Specimen Description BLOOD LEFT ANTECUBITAL  Final   Special Requests   Final    BOTTLES DRAWN AEROBIC AND ANAEROBIC Blood Culture adequate volume   Culture  Setup Time   Final    GRAM NEGATIVE RODS IN BOTH AEROBIC AND ANAEROBIC BOTTLES CRITICAL RESULT CALLED TO, READ BACK BY AND VERIFIED WITHRonnald Nian [phone] FCP Performed at [facility], [place] [address], [place], [place] [address]    Culture ESCHERICHIA COLI (A)  Final   Report Status 07/09/2018 FINAL  Final   Organism ID, Bacteria ESCHERICHIA COLI  Final      Susceptibility   Escherichia coli - MIC*    AMPICILLIN >=32 RESISTANT Resistant     CEFAZOLIN 16 SENSITIVE Sensitive     CEFEPIME <=1 SENSITIVE Sensitive     CEFTAZIDIME <=1 SENSITIVE Sensitive     CEFTRIAXONE <=1 SENSITIVE Sensitive     CIPROFLOXACIN <=0.25 SENSITIVE Sensitive     GENTAMICIN <=1 SENSITIVE Sensitive     IMIPENEM <=0.25 SENSITIVE Sensitive     TRIMETH/SULFA <=20 SENSITIVE Sensitive     AMPICILLIN/SULBACTAM >=32 RESISTANT Resistant     PIP/TAZO <=4 SENSITIVE Sensitive     Extended ESBL NEGATIVE Sensitive     * ESCHERICHIA COLI  Blood Culture ID Panel (Reflexed)     Status: Abnormal   Collection Time: 07/06/18  7:40 PM  Result Value Ref Range Status   Enterococcus species NOT DETECTED NOT DETECTED Final   Listeria monocytogenes NOT DETECTED NOT DETECTED Final   Staphylococcus species NOT DETECTED NOT DETECTED Final   Staphylococcus aureus (BCID) NOT DETECTED NOT DETECTED Final   Streptococcus species NOT DETECTED NOT DETECTED Final   Streptococcus agalactiae NOT DETECTED NOT DETECTED Final   Streptococcus pneumoniae NOT DETECTED NOT DETECTED Final   Streptococcus pyogenes NOT DETECTED NOT DETECTED Final   Acinetobacter baumannii NOT DETECTED NOT DETECTED Final   Enterobacteriaceae species DETECTED (A) NOT DETECTED Final    Comment: Enterobacteriaceae represent a large family of gram-negative  bacteria, not a single organism. CRITICAL RESULT CALLED TO, READ BACK BY AND VERIFIED WITH: PHARMD  MANCHIL, B [phone] FCP    Enterobacter cloacae complex NOT DETECTED NOT DETECTED Final   Escherichia coli DETECTED (A) NOT DETECTED Final    Comment: CRITICAL RESULT CALLED TO, READ BACK BY AND VERIFIED WITH: PHARMD MANCHIL, B [phone] FCP    Klebsiella oxytoca NOT DETECTED NOT DETECTED Final   Klebsiella pneumoniae NOT DETECTED NOT DETECTED Final   Proteus species NOT DETECTED NOT DETECTED Final   Serratia marcescens NOT DETECTED NOT DETECTED Final   Carbapenem resistance NOT DETECTED NOT DETECTED Final   Haemophilus influenzae NOT DETECTED NOT DETECTED Final   Neisseria meningitidis NOT DETECTED NOT DETECTED Final   Pseudomonas aeruginosa NOT DETECTED NOT DETECTED Final   Candida albicans NOT DETECTED NOT DETECTED Final   Candida glabrata NOT DETECTED NOT DETECTED Final   Candida krusei NOT DETECTED NOT DETECTED Final   Candida parapsilosis NOT DETECTED NOT DETECTED Final   Candida tropicalis NOT DETECTED NOT DETECTED Final    Comment: Performed at [facility], [place] [address], [place], [place] [address]  MRSA PCR Screening     Status: None   Collection Time: 07/07/18  3:24 PM  Result Value Ref Range Status   MRSA by PCR NEGATIVE NEGATIVE Final    Comment:        The GeneXpert MRSA Assay (FDA approved for NASAL specimens only), is one component of a comprehensive MRSA colonization surveillance program. It is not intended to diagnose MRSA infection nor to guide or monitor treatment for MRSA infections. Performed at [facility], [place] [address], [place], [place] [address]   Culture, Urine     Status: None   Collection Time: 07/08/18  7:47 AM  Result Value Ref Range Status   Specimen Description URINE, RANDOM  Final   Special Requests NONE  Final   Culture   Final    NO GROWTH Performed at [facility], [place] [address] [place], [place] [address]      Report Status 07/09/2018 FINAL  Final  C difficile quick scan w PCR reflex     Status: Abnormal   Collection Time: 07/09/18 10:38 AM  Result Value Ref Range Status   C Diff antigen POSITIVE (A) NEGATIVE Final   C Diff toxin NEGATIVE NEGATIVE Final   C Diff interpretation Results are indeterminate. See PCR results.  Final    Comment: Performed at [facility], [place] [address], [place], [place] [address]  C. Diff by PCR, Reflexed     Status: Abnormal   Collection Time: 07/09/18 10:38 AM  Result Value Ref Range Status   Toxigenic C. Difficile by PCR POSITIVE (A) NEGATIVE Final    Comment: Positive for toxigenic C. difficile with little to no toxin production. Only treat if clinical presentation suggests symptomatic illness. Performed at [facility], [place] [address], [place], [place] [address]     Alcide Evener, [place] [facility] [phone] pager  07/09/2018, 5:02 PM

## 2018-07-09 NOTE — Progress Notes (Signed)
Notified by CCMD that pt had 7 beat run of V-tach. Assessed pt. Pt in bed resting. No apparent distress. MD notified. Will continue to monitor.

## 2018-07-10 ENCOUNTER — Inpatient Hospital Stay (HOSPITAL_COMMUNITY): Payer: Medicare HMO

## 2018-07-10 DIAGNOSIS — R7881 Bacteremia: Secondary | ICD-10-CM

## 2018-07-10 DIAGNOSIS — R000 Tachycardia, unspecified: Secondary | ICD-10-CM

## 2018-07-10 DIAGNOSIS — B9620 Unspecified Escherichia coli [E. coli] as the cause of diseases classified elsewhere: Secondary | ICD-10-CM

## 2018-07-10 LAB — GLUCOSE, CAPILLARY
GLUCOSE-CAPILLARY: 67 mg/dL — AB (ref 70–99)
GLUCOSE-CAPILLARY: 104 mg/dL — AB (ref 70–99)
GLUCOSE-CAPILLARY: 101 mg/dL — AB (ref 70–99)
Glucose-Capillary: 66 mg/dL — ABNORMAL LOW (ref 70–99)
Glucose-Capillary: 100 mg/dL — ABNORMAL HIGH (ref 70–99)
Glucose-Capillary: 65 mg/dL — ABNORMAL LOW (ref 70–99)
Glucose-Capillary: 107 mg/dL — ABNORMAL HIGH (ref 70–99)

## 2018-07-10 LAB — CSF CELL COUNT WITH DIFFERENTIAL
RBC COUNT CSF: 0 /mm3
Tube #: 3
WBC, CSF: 3 /mm3 (ref 0–5)

## 2018-07-10 LAB — BASIC METABOLIC PANEL
Anion gap: 9 (ref 5–15)
BUN: 62 mg/dL — ABNORMAL HIGH (ref 8–23)
CO2: 25 mmol/L (ref 22–32)
Calcium: 7.2 mg/dL — ABNORMAL LOW (ref 8.9–10.3)
Chloride: 111 mmol/L (ref 98–111)
Creatinine, Ser: 4.7 mg/dL — ABNORMAL HIGH (ref 0.61–1.24)
GFR calc Af Amer: 14 mL/min — ABNORMAL LOW (ref >60)
GFR, EST NON AFRICAN AMERICAN: 12 mL/min — AB (ref >60)
Glucose, Bld: 82 mg/dL (ref 70–99)
POTASSIUM: 3.9 mmol/L (ref 3.5–5.1)
SODIUM: 145 mmol/L (ref 135–145)

## 2018-07-10 LAB — CBC
HCT: 39.3 % (ref 39.0–52.0)
HEMOGLOBIN: 12.4 g/dL — AB (ref 13.0–17.0)
MCH: 28.6 pg (ref 26.0–34.0)
MCHC: 31.6 g/dL (ref 30.0–36.0)
MCV: 90.8 fL (ref 80.0–100.0)
Platelets: 68 10*3/uL — ABNORMAL LOW (ref 150–400)
RBC: 4.33 MIL/uL (ref 4.22–5.81)
RDW: 14.7 % (ref 11.5–15.5)
WBC: 11.8 10*3/uL — ABNORMAL HIGH (ref 4.0–10.5)
nRBC: 0 % (ref 0.0–0.2)

## 2018-07-10 LAB — CRYPTOCOCCAL ANTIGEN, CSF: Crypto Ag: NEGATIVE

## 2018-07-10 LAB — PROTEIN, CSF: Total  Protein, CSF: 30 mg/dL (ref 15–45)

## 2018-07-10 LAB — HEPATITIS B CORE ANTIBODY, TOTAL: Hep B Core Total Ab: NEGATIVE

## 2018-07-10 LAB — HEPATITIS B SURFACE ANTIGEN: Hepatitis B Surface Ag: NEGATIVE

## 2018-07-10 LAB — PROCALCITONIN: Procalcitonin: >150 ng/mL

## 2018-07-10 LAB — HEPATITIS B SURFACE ANTIBODY,QUALITATIVE: Hep B S Ab: NONREACTIVE

## 2018-07-10 LAB — GLUCOSE, CSF: Glucose, CSF: 52 mg/dL (ref 40–70)

## 2018-07-10 MED ORDER — DEXTROSE 50 % IV SOLN
INTRAVENOUS | Status: AC
  Administered 2018-07-10: 25 mL
  Filled 2018-07-10: qty 50

## 2018-07-10 MED ORDER — SODIUM CHLORIDE 0.9 % IV SOLN
2.0000 g | INTRAVENOUS | Status: DC
  Administered 2018-07-10 – 2018-07-12 (×3): 2 g via INTRAVENOUS
  Filled 2018-07-10 (×3): qty 20

## 2018-07-10 MED ORDER — ORAL CARE MOUTH RINSE
15.0000 mL | Freq: Two times a day (BID) | OROMUCOSAL | Status: DC
  Administered 2018-07-10 – 2018-07-17 (×11): 15 mL via OROMUCOSAL

## 2018-07-10 MED ORDER — HEPARIN (PORCINE) 25000 UT/250ML-% IV SOLN
1100.0000 [IU]/h | INTRAVENOUS | Status: DC
  Administered 2018-07-10: 850 [IU]/h via INTRAVENOUS
  Filled 2018-07-10 (×2): qty 250

## 2018-07-10 MED ORDER — METOPROLOL SUCCINATE ER 25 MG PO TB24
12.5000 mg | ORAL_TABLET | Freq: Every day | ORAL | Status: DC
  Administered 2018-07-10 – 2018-07-13 (×4): 12.5 mg via ORAL
  Filled 2018-07-10 (×5): qty 1

## 2018-07-10 MED ORDER — DEXTROSE 50 % IV SOLN
25.0000 mL | Freq: Once | INTRAVENOUS | Status: AC
  Administered 2018-07-10: 25 mL via INTRAVENOUS

## 2018-07-10 MED ORDER — LIDOCAINE HCL (PF) 1 % IJ SOLN
5.0000 mL | Freq: Once | INTRAMUSCULAR | Status: AC
  Administered 2018-07-10: 5 mL via INTRADERMAL
  Filled 2018-07-10 (×2): qty 5

## 2018-07-10 MED ORDER — DEXTROSE 5 % IV SOLN
INTRAVENOUS | Status: DC
  Administered 2018-07-10: 13:00:00 via INTRAVENOUS

## 2018-07-10 MED ORDER — LORAZEPAM 2 MG/ML IJ SOLN: 1.0000 mg | Freq: Once | INTRAMUSCULAR | Status: DC | PRN

## 2018-07-10 MED ORDER — CHLORHEXIDINE GLUCONATE 0.12 % MT SOLN
15.0000 mL | Freq: Two times a day (BID) | OROMUCOSAL | Status: DC
  Administered 2018-07-10 – 2018-07-18 (×15): 15 mL via OROMUCOSAL
  Filled 2018-07-10 (×14): qty 15

## 2018-07-10 NOTE — Progress Notes (Signed)
Pt transported to IR for LP. Telemetry made aware

## 2018-07-10 NOTE — Progress Notes (Signed)
Pt BS 70. I don't feel safe giving pt juice due to drowsiness and poor concentration. Pt asymptomatic. Will recheck pt BS in hour to see if pt is eligible for dextrose IV.

## 2018-07-10 NOTE — Progress Notes (Signed)
Pts CBG 66 this AM, has been low overnight. 62ml of D50 given, attending MD notified, will recheck in 15 min

## 2018-07-10 NOTE — Progress Notes (Signed)
Spoke w/ Dr. Candiss Norse re: LP plan for today. Per Dr. Candiss Norse, 2 physicians can agree on consent for pt, however he feels pt is oriented enough to be able to sign consent. Pts platelets were 68 this AM. Lovenox has been d/c'd. Per Dr. Candiss Norse, as long as IR is ok w/ platelet count pt may still get LP.  Spoke w/ IR to relay all. IR states ok w/ low platelet count and that pt will go for LP today around 1330.

## 2018-07-10 NOTE — Procedures (Signed)
Emergent lumbar puncture requested by Dr. Candiss Norse.  Witnessed consent by telephone from Dr. Wallace Keller.  Labs check and CT head reviewed.  Under fluoro guidance and aseptic technique with local 1% Lidocaine, L3-4 LP performed with single pass 22 g spinal needle.  Clear CSF noted.  8.5 cc collected and sent for labs as per request.  OP 25 cc of water and CP 20 cc of water (not felt to be accurate as patient on stomach and moving).  No immediate complications

## 2018-07-10 NOTE — Procedures (Signed)
Echo attempted. Patient not in room. Will attempt later.

## 2018-07-10 NOTE — Progress Notes (Addendum)
Received call from tele while pt in IR. Informed that pt had a 14 beat run of Vtach @1420 .  Spoke w/ IR to relay. Pt asymptomatic, returned to NSR, all other vitals WNL.  1520: Did speak w/ Cardiology PA to relay above. Reviewed current cardiac meds. Labs unremarkable this AM. Per Cards, will monitor pt overnight and may increase toprol xl dose if needed in AM.

## 2018-07-10 NOTE — Progress Notes (Signed)
Reason for consult:   Subjective: Patient has remarkably improved morning and is awake and following commands.  He is oriented to himself as well as that he is in the hospital.  Received dialysis yesterday.   ROS: negative except above, complains of headache   Examination  Vital signs in last 24 hours: Temp:  [97.9 F (36.6 C)-98.6 F (37 C)] 98.2 F (36.8 C) (02/11 1200) Pulse Rate:  [60-80] 75 (02/10 1752) Resp:  [18-25] 24 (02/11 1200) BP: (82-128)/(57-95) 122/72 (02/11 1200) SpO2:  [93 %-100 %] 94 % (02/11 0144) Weight:  [78 kg] 78 kg (02/10 1709)  General: lying in bed CVS: pulse-normal rate and rhythm RS: breathing comfortably Extremities: normal   Neuro: MS: Alert, oriented, follows commands CN: pupils equal and reactive,  EOMI, face symmetric, tongue midline, normal sensation over face, Motor: 4/5 strength in RUE and RLE extremity, 3/5strength in LUE and LLE Reflexes:  plantars: flexor Coordination: normal Gait: not tested  Basic Metabolic Panel: Recent Labs  Lab 07/07/18 1139 07/08/18 0809 07/08/18 1626 07/09/18 0437 07/10/18 0317  NA 139 139 141 142 145  K 5.5* 5.5* 5.1 4.5 3.9  CL 109 110 113* 114* 111  CO2 18* 16* 17* 18* 25  GLUCOSE 94 108* 119* 136* 82  BUN 54* 74* 78* 83* 62*  CREATININE 4.24* 5.32* 5.12* 5.39* 4.70*  CALCIUM 8.0* 7.5* 7.4* 7.1* 7.2*    CBC: Recent Labs  Lab 07/04/18 0700 07/06/18 1908 07/07/18 0257 07/07/18 1139 07/08/18 0809 07/09/18 0437 07/10/18 0317  WBC 3.7* 1.9* 10.3 19.7* 13.8* 16.0* 11.8*  NEUTROABS 2.2 1.7  --   --   --  14.9*  --   HGB 14.0 15.2 13.1 12.4* 13.1 12.5* 12.4*  HCT 45.6 48.8 43.1 42.4 41.1 39.8 39.3  MCV 91.8 91.2 93.9 96.6 93.2 90.0 90.8  PLT 124* 107* 87* 80* 73* 73* 68*     Coagulation Studies: No results for input(s): LABPROT, INR in the last 72 hours.  Imaging Reviewed:     ASSESSMENT AND PLAN  68 year old male with past medical history of diabetes, CVA, HIV, hypertension,  AICD placement, CKD stage III admitted to [facility] for increased lethargy, fever and hypotension.  Also noted to have myoclonus and neurology was consulted. Work-up so far reveals UTI with E. coli as well as elevated BUN.  He is also noted to have neck stiffness on examination and lumbar puncture is pending.  CT head unremarkable for acute pathology (MRI brain unable to be performed due to AICD).  EEG was performed ruled out nonconvulsive status epilepticus.   LP was performed today CSF showed 3 cells, normal protein and normal glucose-therefore ruling out meningitis.  BUN 62 mental status is greatly improved likely due to receiving dialysis.    Acute Metabolic/Uremic encephalopathy  Urosepsis  C.Difficule infection vs colonization   Recommendations Continue correction of metabolic disturbances Antibiotics per ID recommendations, agree with discontinuing acyclovir and meningitic coverage Continue Keppra for now, however can likely be discontinued over the next few days as there is low suspicion patient had seizures and myoclonus likely from uremia.    Karena Addison Aroor [facility] Pager Number [phone] For questions after 7pm please refer to AMION to reach the Neurologist on call

## 2018-07-10 NOTE — Progress Notes (Addendum)
Subjective:  No complaints   Antibiotics:  Anti-infectives (From admission, onward)   Start     Dose/Rate Route Frequency Ordered Stop   07/10/18 1430  vancomycin (VANCOCIN) IVPB 1000 mg/200 mL premix  Status:  Discontinued     1,000 mg 200 mL/hr over 60 Minutes Intravenous Every 48 hours 07/08/18 1421 07/09/18 0959   07/10/18 0847  cefTRIAXone (ROCEPHIN) 2 g in sodium chloride 0.9 % 100 mL IVPB     2 g 200 mL/hr over 30 Minutes Intravenous Every 24 hours 07/09/18 1004     07/09/18 1400  acyclovir (ZOVIRAX) 745 mg in dextrose 5 % 150 mL IVPB  Status:  Discontinued     10 mg/kg  74.5 kg 164.9 mL/hr over 60 Minutes Intravenous Every 24 hours 07/08/18 1421 07/09/18 0959   07/09/18 1300  bictegravir-emtricitabine-tenofovir AF (BIKTARVY) 50-200-25 MG per tablet 1 tablet     1 tablet Oral Daily 07/09/18 1012     07/08/18 2300  cefTRIAXone (ROCEPHIN) 2 g in sodium chloride 0.9 % 100 mL IVPB  Status:  Discontinued     2 g 200 mL/hr over 30 Minutes Intravenous Every 12 hours 07/08/18 1341 07/09/18 1004   07/08/18 1800  vancomycin (VANCOCIN) IVPB 1000 mg/200 mL premix  Status:  Discontinued     1,000 mg 200 mL/hr over 60 Minutes Intravenous Every 48 hours 07/06/18 2019 07/07/18 1137   07/08/18 1430  ampicillin (OMNIPEN) 2 g in sodium chloride 0.9 % 100 mL IVPB  Status:  Discontinued     2 g 300 mL/hr over 20 Minutes Intravenous Every 12 hours 07/08/18 1343 07/09/18 0959   07/08/18 1430  vancomycin (VANCOCIN) 1,500 mg in sodium chloride 0.9 % 500 mL IVPB     1,500 mg 250 mL/hr over 120 Minutes Intravenous  Once 07/08/18 1421 07/08/18 1723   07/08/18 1400  acyclovir (ZOVIRAX) 745 mg in dextrose 5 % 150 mL IVPB     10 mg/kg  74.5 kg 164.9 mL/hr over 60 Minutes Intravenous  Once 07/08/18 1345 07/09/18 1315   07/07/18 1800  ceFEPIme (MAXIPIME) 1 g in sodium chloride 0.9 % 100 mL IVPB  Status:  Discontinued     1 g 200 mL/hr over 30 Minutes Intravenous Every 24 hours 07/06/18 2019  07/07/18 1137   07/07/18 1145  cefTRIAXone (ROCEPHIN) 2 g in sodium chloride 0.9 % 100 mL IVPB  Status:  Discontinued     2 g 200 mL/hr over 30 Minutes Intravenous Every 24 hours 07/07/18 1137 07/08/18 1341   07/07/18 1000  bictegravir-emtricitabine-tenofovir AF (BIKTARVY) 50-200-25 MG per tablet 1 tablet  Status:  Discontinued     1 tablet Oral Daily 07/07/18 0058 07/07/18 1127   07/06/18 1900  ceFEPIme (MAXIPIME) 2 g in sodium chloride 0.9 % 100 mL IVPB     2 g 200 mL/hr over 30 Minutes Intravenous  Once 07/06/18 1859 07/06/18 2001   07/06/18 1900  vancomycin (VANCOCIN) IVPB 1000 mg/200 mL premix     1,000 mg 200 mL/hr over 60 Minutes Intravenous  Once 07/06/18 1859 07/06/18 2035      Medications: Scheduled Meds: . bictegravir-emtricitabine-tenofovir AF  1 tablet Oral Daily  . Chlorhexidine Gluconate Cloth  6 each Topical Q0600  . insulin aspart  0-9 Units Subcutaneous Q4H  . metoprolol tartrate  2.5 mg Intravenous Q8H   Continuous Infusions: . cefTRIAXone (ROCEPHIN)  IV    . levETIRAcetam 500 mg (07/10/18 0139)   PRN Meds:.acetaminophen **OR** acetaminophen,  metoprolol tartrate, ondansetron **OR** ondansetron (ZOFRAN) IV    Objective: Weight change:   Intake/Output Summary (Last 24 hours) at 07/10/2018 0925 Last data filed at 07/10/2018 0700 Gross per 24 hour  Intake 252.2 ml  Output 970 ml  Net -717.8 ml   Blood pressure 116/68, pulse 75, temperature 98.3 F (36.8 C), temperature source Axillary, resp. rate (!) 23, height 5\' 3"  (1.6 m), weight 78 kg, SpO2 94 %. Temp:  [97.9 F (36.6 C)-98.6 F (37 C)] 98.3 F (36.8 C) (02/11 0144) Pulse Rate:  [60-80] 75 (02/10 1752) Resp:  [20-24] 23 (02/11 0144) BP: (82-128)/(57-95) 116/68 (02/11 0144) SpO2:  [93 %-100 %] 94 % (02/11 0144) Weight:  [78 kg] 78 kg (02/10 1709)  Physical Exam: General: Alert and awake and oriented to person and being in the hospital does not know the month or date or why he is here HEENT:  anicteric sclera, EOMI CVS regular rate, normal  Chest: , no wheezing, no respiratory distress Abdomen: soft non-distended,  Skin: no rashes Neuro: nonfocal  CBC:    BMET Recent Labs    07/09/18 0437 07/10/18 0317  NA 142 145  K 4.5 3.9  CL 114* 111  CO2 18* 25  GLUCOSE 136* 82  BUN 83* 62*  CREATININE 5.39* 4.70*  CALCIUM 7.1* 7.2*     Liver Panel  Recent Labs    07/08/18 0809 07/09/18 0437  PROT 6.1* 5.9*  ALBUMIN 2.3* 2.1*  AST 80* 57*  ALT 117* 79*  ALKPHOS 78 78  BILITOT 0.9 0.8       Sedimentation Rate No results for input(s): ESRSEDRATE in the last 72 hours. C-Reactive Protein No results for input(s): CRP in the last 72 hours.  Micro Results: Recent Results (from the past 720 hour(s))  Blood Culture (routine x 2)     Status: Abnormal (Preliminary result)   Collection Time: 07/06/18  7:07 PM  Result Value Ref Range Status   Specimen Description BLOOD RIGHT FOREARM  Final   Special Requests   Final    BOTTLES DRAWN AEROBIC AND ANAEROBIC Blood Culture adequate volume   Culture  Setup Time   Final    GRAM NEGATIVE RODS ANAEROBIC BOTTLE ONLY CRITICAL VALUE NOTED.  VALUE IS CONSISTENT WITH PREVIOUSLY REPORTED AND CALLED VALUE.    Culture (A)  Final    ESCHERICHIA COLI SUSCEPTIBILITIES PERFORMED ON PREVIOUS CULTURE WITHIN THE LAST 5 DAYS. Performed at [facility], [place] [address] [place], [place] [address]    Report Status PENDING  Incomplete  Blood Culture (routine x 2)     Status: Abnormal   Collection Time: 07/06/18  7:40 PM  Result Value Ref Range Status   Specimen Description BLOOD LEFT ANTECUBITAL  Final   Special Requests   Final    BOTTLES DRAWN AEROBIC AND ANAEROBIC Blood Culture adequate volume   Culture  Setup Time   Final    GRAM NEGATIVE RODS IN BOTH AEROBIC AND ANAEROBIC BOTTLES CRITICAL RESULT CALLED TO, READ BACK BY AND VERIFIED WITHRonnald Nian [phone] FCP Performed at [facility], [place]  [address], [place], [place] [address]    Culture ESCHERICHIA COLI (A)  Final   Report Status 07/09/2018 FINAL  Final   Organism ID, Bacteria ESCHERICHIA COLI  Final      Susceptibility   Escherichia coli - MIC*    AMPICILLIN >=32 RESISTANT Resistant     CEFAZOLIN 16 SENSITIVE Sensitive     CEFEPIME <=1  SENSITIVE Sensitive     CEFTAZIDIME <=1 SENSITIVE Sensitive     CEFTRIAXONE <=1 SENSITIVE Sensitive     CIPROFLOXACIN <=0.25 SENSITIVE Sensitive     GENTAMICIN <=1 SENSITIVE Sensitive     IMIPENEM <=0.25 SENSITIVE Sensitive     TRIMETH/SULFA <=20 SENSITIVE Sensitive     AMPICILLIN/SULBACTAM >=32 RESISTANT Resistant     PIP/TAZO <=4 SENSITIVE Sensitive     Extended ESBL NEGATIVE Sensitive     * ESCHERICHIA COLI  Blood Culture ID Panel (Reflexed)     Status: Abnormal   Collection Time: 07/06/18  7:40 PM  Result Value Ref Range Status   Enterococcus species NOT DETECTED NOT DETECTED Final   Listeria monocytogenes NOT DETECTED NOT DETECTED Final   Staphylococcus species NOT DETECTED NOT DETECTED Final   Staphylococcus aureus (BCID) NOT DETECTED NOT DETECTED Final   Streptococcus species NOT DETECTED NOT DETECTED Final   Streptococcus agalactiae NOT DETECTED NOT DETECTED Final   Streptococcus pneumoniae NOT DETECTED NOT DETECTED Final   Streptococcus pyogenes NOT DETECTED NOT DETECTED Final   Acinetobacter baumannii NOT DETECTED NOT DETECTED Final   Enterobacteriaceae species DETECTED (A) NOT DETECTED Final    Comment: Enterobacteriaceae represent a large family of gram-negative bacteria, not a single organism. CRITICAL RESULT CALLED TO, READ BACK BY AND VERIFIED WITH: PHARMD MANCHIL, B [phone] FCP    Enterobacter cloacae complex NOT DETECTED NOT DETECTED Final   Escherichia coli DETECTED (A) NOT DETECTED Final    Comment: CRITICAL RESULT CALLED TO, READ BACK BY AND VERIFIED WITH: PHARMD MANCHIL, B [phone] FCP    Klebsiella oxytoca NOT DETECTED NOT DETECTED Final   Klebsiella  pneumoniae NOT DETECTED NOT DETECTED Final   Proteus species NOT DETECTED NOT DETECTED Final   Serratia marcescens NOT DETECTED NOT DETECTED Final   Carbapenem resistance NOT DETECTED NOT DETECTED Final   Haemophilus influenzae NOT DETECTED NOT DETECTED Final   Neisseria meningitidis NOT DETECTED NOT DETECTED Final   Pseudomonas aeruginosa NOT DETECTED NOT DETECTED Final   Candida albicans NOT DETECTED NOT DETECTED Final   Candida glabrata NOT DETECTED NOT DETECTED Final   Candida krusei NOT DETECTED NOT DETECTED Final   Candida parapsilosis NOT DETECTED NOT DETECTED Final   Candida tropicalis NOT DETECTED NOT DETECTED Final    Comment: Performed at [facility], [place] [address], [place], [place] [address]  MRSA PCR Screening     Status: None   Collection Time: 07/07/18  3:24 PM  Result Value Ref Range Status   MRSA by PCR NEGATIVE NEGATIVE Final    Comment:        The GeneXpert MRSA Assay (FDA approved for NASAL specimens only), is one component of a comprehensive MRSA colonization surveillance program. It is not intended to diagnose MRSA infection nor to guide or monitor treatment for MRSA infections. Performed at [facility], [place] [address], [place], [place] [address]   Culture, Urine     Status: None   Collection Time: 07/08/18  7:47 AM  Result Value Ref Range Status   Specimen Description URINE, RANDOM  Final   Special Requests NONE  Final   Culture   Final    NO GROWTH Performed at [facility], [place] [address] [place], [place] [address]    Report Status 07/09/2018 FINAL  Final  Culture, blood (Routine X 2) w Reflex to ID Panel     Status: None (Preliminary result)   Collection Time: 07/09/18 10:18 AM  Result Value  Ref Range Status   Specimen Description BLOOD RIGHT HAND  Final   Special Requests   Final    BOTTLES DRAWN AEROBIC AND ANAEROBIC Blood Culture adequate volume   Culture   Final    NO GROWTH < 24 HOURS Performed at [place], [address] [place], [place] [address]    Report Status PENDING  Incomplete  Culture, blood (Routine X 2) w Reflex to ID Panel     Status: None (Preliminary result)   Collection Time: 07/09/18 10:25 AM  Result Value Ref Range Status   Specimen Description BLOOD LEFT HAND  Final   Special Requests   Final    BOTTLES DRAWN AEROBIC AND ANAEROBIC Blood Culture adequate volume   Culture   Final    NO GROWTH < 24 HOURS Performed at [facility], [place] [address], [place], [place] [address]    Report Status PENDING  Incomplete  C difficile quick scan w PCR reflex     Status: Abnormal   Collection Time: 07/09/18 10:38 AM  Result Value Ref Range Status   C Diff antigen POSITIVE (A) NEGATIVE Final   C Diff toxin NEGATIVE NEGATIVE Final   C Diff interpretation Results are indeterminate. See PCR results.  Final    Comment: Performed at [facility], [place] [address], [place], [place] [address]  C. Diff by PCR, Reflexed     Status: Abnormal   Collection Time: 07/09/18 10:38 AM  Result Value Ref Range Status   Toxigenic C. Difficile by PCR POSITIVE (A) NEGATIVE Final    Comment: Positive for toxigenic C. difficile with little to no toxin production. Only treat if clinical presentation suggests symptomatic illness. Performed at [facility], [place] [address] [place], [place] [address]     Studies/Results: Ir Cyndy Freeze Guide Cv Line Right  Result Date: 07/09/2018 INDICATION: Acute uremia, acute kidney injury, no current access for dialysis EXAM: Ultrasound guidance for vascular access Right IJ temporary dialysis catheter (Mahurkar catheter) MEDICATIONS: 1% lidocaine local ANESTHESIA/SEDATION: Moderate Sedation Time: None. The patient's level of consciousness and vital signs were monitored continuously by radiology nursing throughout the procedure under my direct supervision. FLUOROSCOPY TIME:  Fluoroscopy Time: 0 minutes 24 seconds (2 mGy). COMPLICATIONS: None immediate. PROCEDURE:  Emergency consent was obtained from the patient's physician after a thorough discussion of the procedural risks, benefits and alternatives. All questions were addressed. Maximal Sterile Barrier Technique was utilized including caps, mask, sterile gowns, sterile gloves, sterile drape, hand hygiene and skin antiseptic. A timeout was performed prior to the initiation of the procedure. Under sterile conditions and local anesthesia, ultrasound micropuncture access performed of the right internal jugular vein. Images obtained for documentation of the patent jugular vein. Guidewire advanced easily. Measurements obtained. Four [place] dilator advanced. Amplatz guidewire advanced into the IVC. Tract dilatation performed to insert a temporary dialysis catheter. Tip position at the SVC RA junction. Images obtained for documentation. Catheter secured Prolene sutures. Blood aspirated easily followed by saline and heparin flushes. Appropriate volume and strength of heparin instilled in all lumens. External caps applied. Catheter secured with Prolene sutures and a sterile dressing. No immediate complication. Patient tolerated the procedure well. IMPRESSION: Successful ultrasound and fluoroscopic right IJ temporary dialysis catheter. Tip SVC RA junction. Ready for use. Electronically Signed   By: Jerilynn Mages.  Shick M.D.   On: 07/09/2018 13:06   Ir US Guide Vasc Access Right  Result Date: 07/09/2018 INDICATION: Acute uremia, acute kidney injury, no  current access for dialysis EXAM: Ultrasound guidance for vascular access Right IJ temporary dialysis catheter (Mahurkar catheter) MEDICATIONS: 1% lidocaine local ANESTHESIA/SEDATION: Moderate Sedation Time: None. The patient's level of consciousness and vital signs were monitored continuously by radiology nursing throughout the procedure under my direct supervision. FLUOROSCOPY TIME:  Fluoroscopy Time: 0 minutes 24 seconds (2 mGy). COMPLICATIONS: None immediate. PROCEDURE: Emergency consent  was obtained from the patient's physician after a thorough discussion of the procedural risks, benefits and alternatives. All questions were addressed. Maximal Sterile Barrier Technique was utilized including caps, mask, sterile gowns, sterile gloves, sterile drape, hand hygiene and skin antiseptic. A timeout was performed prior to the initiation of the procedure. Under sterile conditions and local anesthesia, ultrasound micropuncture access performed of the right internal jugular vein. Images obtained for documentation of the patent jugular vein. Guidewire advanced easily. Measurements obtained. Four [place] dilator advanced. Amplatz guidewire advanced into the IVC. Tract dilatation performed to insert a temporary dialysis catheter. Tip position at the SVC RA junction. Images obtained for documentation. Catheter secured Prolene sutures. Blood aspirated easily followed by saline and heparin flushes. Appropriate volume and strength of heparin instilled in all lumens. External caps applied. Catheter secured with Prolene sutures and a sterile dressing. No immediate complication. Patient tolerated the procedure well. IMPRESSION: Successful ultrasound and fluoroscopic right IJ temporary dialysis catheter. Tip SVC RA junction. Ready for use. Electronically Signed   By: Jerilynn Mages.  Shick M.D.   On: 07/09/2018 13:06      Assessment/Plan:  INTERVAL HISTORY: He is status post hemodialysis and now seems clinically much better in terms of his confusion   Principal Problem:   Septic shock (HCC) Active Problems:   Chronic kidney disease (CKD), active medical management without dialysis, stage 3 (moderate) (HCC)   Diabetes mellitus type 2 in nonobese [facility])   Essential hypertension, benign   ICD (implantable cardioverter-defibrillator) in place   Acute kidney injury ([place])   HIV disease ([place])   Chronic combined systolic and diastolic heart failure ([place])   Acute metabolic encephalopathy   Encephalitis   ARF (acute renal  failure) (HCC)   Sepsis with encephalopathy and septic shock (HCC)   Tachycardia   E coli bacteremia    Keith Young is a 68 y.o. male with well-controlled HIV, chronic kidney disease presented with sepsis due to E. coli bacteremia acute kidney injury and uremia, improved with IV antibiotics and dialysis.  His stool was positive for C. difficile antigen but negative for toxin positive for PCR  #1 E. coli bacteremia: Narrow to cefazolin given concern for precipitating C. difficile colitis with third-generation cephalosporin  He NEEDS at minimum TTE given ICD in place. I would treat him minimum of 2 weeks and then check surveillance blood cultures.  #2 Encephalopathy  due to #1   #3  Stool with C. difficile present HE NEEDS TO BE ON ENTERIC PRECAUTIONS and monitored for loose stools  #4 HIV: resume Biktarvy  ADDENDUM: Jimmy Footman from [place] without high MIC to cefazolin though it is being listed as being sensitive we will therefore switch back to ceftriaxone to ensure efficacy of his antimicrobial therapy  LOS: 3 days   Alcide Evener 07/10/2018, 9:25 AM

## 2018-07-10 NOTE — Progress Notes (Signed)
Progress Note  Patient Name: Keith Young Date of Encounter: 07/10/2018  Primary Cardiologist: No primary care provider on file.   Subjective   Feeling better. Denies chest pain or shortness of breath.   Inpatient Medications    Scheduled Meds: . bictegravir-emtricitabine-tenofovir AF  1 tablet Oral Daily  . chlorhexidine  15 mL Mouth Rinse BID  . Chlorhexidine Gluconate Cloth  6 each Topical Q0600  . insulin aspart  0-9 Units Subcutaneous Q4H  . lidocaine (PF)  5 mL Intradermal Once  . mouth rinse  15 mL Mouth Rinse q12n4p  . metoprolol tartrate  2.5 mg Intravenous Q8H   Continuous Infusions: . cefTRIAXone (ROCEPHIN)  IV 2 g (07/10/18 1014)  . dextrose    . levETIRAcetam 500 mg (07/10/18 0139)   PRN Meds: acetaminophen **OR** acetaminophen, LORazepam, metoprolol tartrate, ondansetron **OR** ondansetron (ZOFRAN) IV   Vital Signs    Vitals:   07/09/18 1709 07/09/18 1752 07/09/18 2035 07/10/18 0144  BP: 126/69 119/68 121/73 116/68  Pulse: 73 75    Resp: 20 (!) 22  (!) 23  Temp: 97.9 F (36.6 C) 98.2 F (36.8 C) 98.2 F (36.8 C) 98.3 F (36.8 C)  TempSrc: Axillary Oral Axillary Axillary  SpO2: 100% 96% 96% 94%  Weight: 78 kg     Height:        Intake/Output Summary (Last 24 hours) at 07/10/2018 1119 Last data filed at 07/10/2018 0700 Gross per 24 hour  Intake 252.2 ml  Output 970 ml  Net -717.8 ml   Last 3 Weights 07/09/2018 07/09/2018 07/06/2018  Weight (lbs) 171 lb 15.3 oz 171 lb 15.3 oz 164 lb 3.2 oz  Weight (kg) 78 kg 78 kg 74.481 kg      Telemetry    Sinus rhythm.  PVCS, up to 8 beats of NSVT - Personally Reviewed  ECG    n/a - Personally Reviewed  Physical Exam   VS:  BP 116/68 (BP Location: Right Arm)   Pulse 75   Temp 98.3 F (36.8 C) (Axillary)   Resp (!) 23   Ht '5\' 3"'  (1.6 m)   Wt 78 kg   SpO2 94%   BMI 30.46 kg/m  , BMI Body mass index is 30.46 kg/m. GENERAL:  Chronically ill-appearing HEENT: Pupils equal round and reactive,  fundi not visualized, oral mucosa unremarkable NECK:  No jugular venous distention, waveform within normal limits, carotid upstroke brisk and symmetric, no bruits LUNGS:  Clear to auscultation bilaterally HEART:  RRR.  PMI not displaced or sustained,S1 and S2 within normal limits, no S3, no S4, no clicks, no rubs, II/VI systolic murmur ABD:  Flat, positive bowel sounds normal in frequency in pitch, no bruits, no rebound, no guarding, no midline pulsatile mass, no hepatomegaly, no splenomegaly EXT:  2 plus pulses throughout, no edema, no cyanosis no clubbing SKIN:  No rashes no nodules NEURO:  Cranial nerves II through XII grossly intact, motor grossly intact throughout [facility]:  Cognitively intact, oriented to person place and time   Labs    Chemistry Recent Labs  Lab 07/07/18 1139 07/08/18 0809 07/08/18 1626 07/09/18 0437 07/10/18 0317  NA 139 139 141 142 145  K 5.5* 5.5* 5.1 4.5 3.9  CL 109 110 113* 114* 111  CO2 18* 16* 17* 18* 25  GLUCOSE 94 108* 119* 136* 82  BUN 54* 74* 78* 83* 62*  CREATININE 4.24* 5.32* 5.12* 5.39* 4.70*  CALCIUM 8.0* 7.5* 7.4* 7.1* 7.2*  PROT 6.1* 6.1*  --  5.9*  --   ALBUMIN 2.5* 2.3*  --  2.1*  --   AST 161* 80*  --  57*  --   ALT 165* 117*  --  79*  --   ALKPHOS 84 78  --  78  --   BILITOT 1.1 0.9  --  0.8  --   GFRNONAA 14* 10* 11* 10* 12*  GFRAA 16* 12* 13* 12* 14*  ANIONGAP '12 13 11 10 9     ' Hematology Recent Labs  Lab 07/08/18 0809 07/09/18 0437 07/10/18 0317  WBC 13.8* 16.0* 11.8*  RBC 4.41 4.42 4.33  HGB 13.1 12.5* 12.4*  HCT 41.1 39.8 39.3  MCV 93.2 90.0 90.8  MCH 29.7 28.3 28.6  MCHC 31.9 31.4 31.6  RDW 14.7 14.6 14.7  PLT 73* 73* 68*    Cardiac EnzymesNo results for input(s): TROPONINI in the last 168 hours. No results for input(s): TROPIPOC in the last 168 hours.   BNP Recent Labs  Lab 07/04/18 0700 07/06/18 1918  BNP 697.0* 554.5*     DDimer No results for input(s): DDIMER in the last 168 hours.   Radiology      Ir Fluoro Guide Cv Line Right  Result Date: 07/09/2018 INDICATION: Acute uremia, acute kidney injury, no current access for dialysis EXAM: Ultrasound guidance for vascular access Right IJ temporary dialysis catheter (Mahurkar catheter) MEDICATIONS: 1% lidocaine local ANESTHESIA/SEDATION: Moderate Sedation Time: None. The patient's level of consciousness and vital signs were monitored continuously by radiology nursing throughout the procedure under my direct supervision. FLUOROSCOPY TIME:  Fluoroscopy Time: 0 minutes 24 seconds (2 mGy). COMPLICATIONS: None immediate. PROCEDURE: Emergency consent was obtained from the patient's physician after a thorough discussion of the procedural risks, benefits and alternatives. All questions were addressed. Maximal Sterile Barrier Technique was utilized including caps, mask, sterile gowns, sterile gloves, sterile drape, hand hygiene and skin antiseptic. A timeout was performed prior to the initiation of the procedure. Under sterile conditions and local anesthesia, ultrasound micropuncture access performed of the right internal jugular vein. Images obtained for documentation of the patent jugular vein. Guidewire advanced easily. Measurements obtained. Four [place] dilator advanced. Amplatz guidewire advanced into the IVC. Tract dilatation performed to insert a temporary dialysis catheter. Tip position at the SVC RA junction. Images obtained for documentation. Catheter secured Prolene sutures. Blood aspirated easily followed by saline and heparin flushes. Appropriate volume and strength of heparin instilled in all lumens. External caps applied. Catheter secured with Prolene sutures and a sterile dressing. No immediate complication. Patient tolerated the procedure well. IMPRESSION: Successful ultrasound and fluoroscopic right IJ temporary dialysis catheter. Tip SVC RA junction. Ready for use. Electronically Signed   By: Jerilynn Mages.  Shick M.D.   On: 07/09/2018 13:06   Ir US Guide Vasc  Access Right  Result Date: 07/09/2018 INDICATION: Acute uremia, acute kidney injury, no current access for dialysis EXAM: Ultrasound guidance for vascular access Right IJ temporary dialysis catheter (Mahurkar catheter) MEDICATIONS: 1% lidocaine local ANESTHESIA/SEDATION: Moderate Sedation Time: None. The patient's level of consciousness and vital signs were monitored continuously by radiology nursing throughout the procedure under my direct supervision. FLUOROSCOPY TIME:  Fluoroscopy Time: 0 minutes 24 seconds (2 mGy). COMPLICATIONS: None immediate. PROCEDURE: Emergency consent was obtained from the patient's physician after a thorough discussion of the procedural risks, benefits and alternatives. All questions were addressed. Maximal Sterile Barrier Technique was utilized including caps, mask, sterile gowns, sterile gloves, sterile drape, hand hygiene and skin antiseptic. A timeout was performed prior to  the initiation of the procedure. Under sterile conditions and local anesthesia, ultrasound micropuncture access performed of the right internal jugular vein. Images obtained for documentation of the patent jugular vein. Guidewire advanced easily. Measurements obtained. Four [place] dilator advanced. Amplatz guidewire advanced into the IVC. Tract dilatation performed to insert a temporary dialysis catheter. Tip position at the SVC RA junction. Images obtained for documentation. Catheter secured Prolene sutures. Blood aspirated easily followed by saline and heparin flushes. Appropriate volume and strength of heparin instilled in all lumens. External caps applied. Catheter secured with Prolene sutures and a sterile dressing. No immediate complication. Patient tolerated the procedure well. IMPRESSION: Successful ultrasound and fluoroscopic right IJ temporary dialysis catheter. Tip SVC RA junction. Ready for use. Electronically Signed   By: Jerilynn Mages.  Shick M.D.   On: 07/09/2018 13:06    Cardiac Studies   Echo  12/10/16: Study Conclusions  - Left ventricle: There was moderate concentric hypertrophy.   Systolic function was moderately to severely reduced. The   estimated ejection fraction was in the range of 30% to 35%.   Severe hypokinesis of the anteroseptal, inferoseptal, inferior,   and mid-apical inferolateral myocardium. Doppler parameters are   consistent with abnormal left ventricular relaxation (grade 1   diastolic dysfunction). Doppler parameters are consistent with   indeterminate ventricular filling pressure. - Aortic valve: A bioprosthesis was present. Peak velocity (S): 331   cm/s. Mean gradient (S): 30 mm Hg. Valve area (VTI): 1.09 cm^2.   Valve area (Vmax): 1.12 cm^2. Valve area (Vmean): 0.95 cm^2. - Mitral valve: Moderately calcified annulus. Transvalvular   velocity was within the normal range. There was no evidence for   stenosis. There was no regurgitation. - Left atrium: The atrium was severely dilated. - Right ventricle: The cavity size was normal. Wall thickness was   normal. Systolic function was normal. - Right atrium: The atrium was moderately dilated. - Tricuspid valve: There was mild regurgitation. - Pulmonary arteries: Systolic pressure was within the normal   range. PA peak pressure: 27 mm Hg (S). - Impressions: Bioprosthetic aortic valve gradients are moderately   elevated and mildly increased from 05/2016 (26 mmHg to 30 mmHg).   Valve leaflets appear to have normal excursion. Given the peak   velocity >63ms, mean gradient 30 mmHg, dimentionless valve index   of 0.43 and acceleration time of 137 ms, cannot rule out mild   prosthetic valve stensosis.  Impressions:  - Bioprosthetic aortic valve gradients are moderately elevated and   mildly increased from 05/2016 (26 mmHg to 30 mmHg). Valve leaflets   appear to have normal excursion. Given the peak velocity >345m,   mean gradient 30 mmHg, dimentionless valve index of 0.43 and   acceleration time of 137 ms,  cannot rule out mild prosthetic   valve stensosis.  Patient Profile     Mr. 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 Coli bacteremia and encephalopathy.  Cardiology was consulted for atrial fibrillation/flutter with RVR.    Assessment & Plan    # Atrial fibrillation with RVR: # NSVT: Now back in sinus rhythm with PVCs and NSVT.  Heparin was not started due to plans for LP.  Given his CHA2DS2-Vasc score 5, would start this when able.  Ultimately he will need anticoagulation with Eliquis vs. Warfarin.  Will defer to nephrology on their preference.  Will  switch metoprolol from IV to succinate.  # Chronic systolic an diastolic heart failure: # S/p ICD: He isn't a candidate for ACE-I/ARB/ARNI givne his renal function.  Home digoxin on hold in the setting of worsening renal function.  He has been getting IV metoprolol 2/2 encephalopathy.  # Bioprosthetic AVR: Mean gradient moderately elevated to 30 mmHg in 2018.  Repeat echo pending.   # Encephalopathy: Uremia certainly contributing.  Mental status improving with HD and antibiotics.  He is MUCH better today.  Primary team also ruling out infectious etiologies, including LP today.  He has E. Coli bacteremia with a urinary source.      For questions or updates, please contact [place] Please consult [email or web address] for contact info under        Signed, Skeet Latch, MD  07/10/2018, 11:19 AM

## 2018-07-10 NOTE — Progress Notes (Signed)
PROGRESS NOTE                                                                                                                                                                                                             Patient Demographics:    Keith Young, is a 68 y.o. male, DOB - 04-05-1951, PJA:[id number]  Admit date - 07/06/2018   Admitting Physician Etta Quill, DO  Outpatient Primary MD for the patient is Virgie Dad, MD  LOS - 3  Chief Complaint  Patient presents with  . Code Sepsis       Brief Narrative this is a 68 year old African-American gentleman with history of chronic systolic CHF EF 34% with AICD, CKD 4, DM type II, COPD, hypertension, history of CVA, history of HIV followed by ID in the outpatient setting who presented with high fevers, decreased mental status along with nausea vomiting on 07/06/2018, he was diagnosed with sepsis, ID, neurology and renal were consulted.  He was transferred to my care on 07/09/2018 on day 3 of his hospital stay.    Subjective:   Patient in bed, appears comfortable, denies any headache, no fever, no chest pain or pressure, no shortness of breath , no abdominal pain. No focal weakness.   Assessment  & Plan :     1)  Sepsis due to E. coli bacteremia.  Source unclear.  Currently on Rocephin, also on his HIV medications along with acyclovir .  Neurology & ID on board.  MRSA PCR negative.  Currently on Rocephin 2 g per ID, acyclovir and vancomycin has been discontinued.  Due for LP on 07/10/2018.  Will follow IDs recommendation.  May require imaging of his abdomen and pelvis if LP is unremarkable to look for the source.   2)  ARF on CKD 4.  Baseline creatinine is around 2.5.  Nephrology following, he received HD catheter placement on 07/09/2018 and received first dialysis treatment on the same day, mentation has improved, likely was severely uremic as well, defer further HD treatments to nephrology.  3) Toxic Encephalopathy  with suspicion for uremia versus due to infection- EEG was negative, head CT negative, after HD treatment on 07/09/2018 mentation has remarkably improved, will wait for LP results, neurology following closely.   4) HIV CD4 count above 200 -continue home meds.  5) History of chronic systolic CHF ejection fraction 30%/AICD - for now supportive care, cannot use ACE/ARB due to renal failure, blood pressure too low for beta-blocker or long-acting  nitrate.  Fluid removal now appears to be dependent upon HD which is to be initiated on 07/09/2018.     6) Essential hypertension.  Blood pressure too low for any blood pressure medications at this time.  7)  New onset A. fib RVR.  Mali vas 2 score will be at least greater than 4.  Goal will be rate control, echo requested, TSH stable, once LP is done will initiate heparin drip without bolus around 8 hours after LP along with Coumadin overlap, pharmacy consulted for the same.  Goal will be rate control.  Cardiology requested to follow as well.  8. DM2 - ISS, due to poor oral intake gentle D5 drip for 24 hours.  Speech to see and clear for oral diet as mentation has improved..  Lab Results  Component Value Date   HGBA1C 6.5 (H) 05/22/2018   CBG (last 3)  Recent Labs    07/10/18 0109 07/10/18 0740 07/10/18 0846  GLUCAP 67* 66* 100*     Family Communication  :  None present  Code Status :  DNR  Disposition Plan  :  TBD  Consults  :  IR,Renal, Neuro, ID, cardiology  Procedures  :    CT head.  Nonacute Ultrasound.  Nonacute with evidence of chronic medical renal disease. IR - HD catheter placement on 07/09/2018 IR for LP on 07/10/2018  DVT Prophylaxis  :  Lovenox    Lab Results  Component Value Date   PLT 68 (L) 07/10/2018    Diet :  Diet Order            Diet NPO time specified  Diet effective midnight               Inpatient Medications Scheduled Meds: . bictegravir-emtricitabine-tenofovir AF  1 tablet Oral Daily  .  chlorhexidine  15 mL Mouth Rinse BID  . Chlorhexidine Gluconate Cloth  6 each Topical Q0600  . insulin aspart  0-9 Units Subcutaneous Q4H  . mouth rinse  15 mL Mouth Rinse q12n4p  . metoprolol tartrate  2.5 mg Intravenous Q8H   Continuous Infusions: . cefTRIAXone (ROCEPHIN)  IV 2 g (07/10/18 1014)  . levETIRAcetam 500 mg (07/10/18 0139)   PRN Meds:.acetaminophen **OR** acetaminophen, metoprolol tartrate, ondansetron **OR** ondansetron (ZOFRAN) IV  Antibiotics  :   Anti-infectives (From admission, onward)   Start     Dose/Rate Route Frequency Ordered Stop   07/10/18 1430  vancomycin (VANCOCIN) IVPB 1000 mg/200 mL premix  Status:  Discontinued     1,000 mg 200 mL/hr over 60 Minutes Intravenous Every 48 hours 07/08/18 1421 07/09/18 0959   07/10/18 1000  cefTRIAXone (ROCEPHIN) 2 g in sodium chloride 0.9 % 100 mL IVPB     2 g 200 mL/hr over 30 Minutes Intravenous Every 24 hours 07/10/18 0942     07/10/18 0847  cefTRIAXone (ROCEPHIN) 2 g in sodium chloride 0.9 % 100 mL IVPB  Status:  Discontinued     2 g 200 mL/hr over 30 Minutes Intravenous Every 24 hours 07/09/18 1004 07/10/18 0933   07/09/18 1400  acyclovir (ZOVIRAX) 745 mg in dextrose 5 % 150 mL IVPB  Status:  Discontinued     10 mg/kg  74.5 kg 164.9 mL/hr over 60 Minutes Intravenous Every 24 hours 07/08/18 1421 07/09/18 0959   07/09/18 1300  bictegravir-emtricitabine-tenofovir AF (BIKTARVY) 50-200-25 MG per tablet 1 tablet     1 tablet Oral Daily 07/09/18 1012     07/08/18 2300  cefTRIAXone (ROCEPHIN) 2  g in sodium chloride 0.9 % 100 mL IVPB  Status:  Discontinued     2 g 200 mL/hr over 30 Minutes Intravenous Every 12 hours 07/08/18 1341 07/09/18 1004   07/08/18 1800  vancomycin (VANCOCIN) IVPB 1000 mg/200 mL premix  Status:  Discontinued     1,000 mg 200 mL/hr over 60 Minutes Intravenous Every 48 hours 07/06/18 2019 07/07/18 1137   07/08/18 1430  ampicillin (OMNIPEN) 2 g in sodium chloride 0.9 % 100 mL IVPB  Status:  Discontinued      2 g 300 mL/hr over 20 Minutes Intravenous Every 12 hours 07/08/18 1343 07/09/18 0959   07/08/18 1430  vancomycin (VANCOCIN) 1,500 mg in sodium chloride 0.9 % 500 mL IVPB     1,500 mg 250 mL/hr over 120 Minutes Intravenous  Once 07/08/18 1421 07/08/18 1723   07/08/18 1400  acyclovir (ZOVIRAX) 745 mg in dextrose 5 % 150 mL IVPB     10 mg/kg  74.5 kg 164.9 mL/hr over 60 Minutes Intravenous  Once 07/08/18 1345 07/09/18 1315   07/07/18 1800  ceFEPIme (MAXIPIME) 1 g in sodium chloride 0.9 % 100 mL IVPB  Status:  Discontinued     1 g 200 mL/hr over 30 Minutes Intravenous Every 24 hours 07/06/18 2019 07/07/18 1137   07/07/18 1145  cefTRIAXone (ROCEPHIN) 2 g in sodium chloride 0.9 % 100 mL IVPB  Status:  Discontinued     2 g 200 mL/hr over 30 Minutes Intravenous Every 24 hours 07/07/18 1137 07/08/18 1341   07/07/18 1000  bictegravir-emtricitabine-tenofovir AF (BIKTARVY) 50-200-25 MG per tablet 1 tablet  Status:  Discontinued     1 tablet Oral Daily 07/07/18 0058 07/07/18 1127   07/06/18 1900  ceFEPIme (MAXIPIME) 2 g in sodium chloride 0.9 % 100 mL IVPB     2 g 200 mL/hr over 30 Minutes Intravenous  Once 07/06/18 1859 07/06/18 2001   07/06/18 1900  vancomycin (VANCOCIN) IVPB 1000 mg/200 mL premix     1,000 mg 200 mL/hr over 60 Minutes Intravenous  Once 07/06/18 1859 07/06/18 2035          Objective:   Vitals:   07/09/18 1709 07/09/18 1752 07/09/18 2035 07/10/18 0144  BP: 126/69 119/68 121/73 116/68  Pulse: 73 75    Resp: 20 (!) 22  (!) 23  Temp: 97.9 F (36.6 C) 98.2 F (36.8 C) 98.2 F (36.8 C) 98.3 F (36.8 C)  TempSrc: Axillary Oral Axillary Axillary  SpO2: 100% 96% 96% 94%  Weight: 78 kg     Height:        Wt Readings from Last 3 Encounters:  07/09/18 78 kg  07/03/18 74.5 kg  06/13/18 73.4 kg     Intake/Output Summary (Last 24 hours) at 07/10/2018 1054 Last data filed at 07/10/2018 0700 Gross per 24 hour  Intake 252.2 ml  Output 970 ml  Net -717.8 ml      Physical Exam  Awake Alert, Oriented X 3, No new F.N deficits, Normal affect [place].AT,PERRAL Supple Neck,No JVD, No cervical lymphadenopathy appriciated.  Symmetrical Chest wall movement, Good air movement bilaterally, CTAB RRR,No Gallops, Rubs or new Murmurs, No Parasternal Heave +ve B.Sounds, Abd Soft, No tenderness, No organomegaly appriciated, No rebound - guarding or rigidity. No Cyanosis, Clubbing or edema, No new Rash or bruise     Data Review:    CBC Recent Labs  Lab 07/04/18 0700 07/06/18 1908 07/07/18 0257 07/07/18 1139 07/08/18 0809 07/09/18 0437 07/10/18 0317  WBC 3.7* 1.9* 10.3  19.7* 13.8* 16.0* 11.8*  HGB 14.0 15.2 13.1 12.4* 13.1 12.5* 12.4*  HCT 45.6 48.8 43.1 42.4 41.1 39.8 39.3  PLT 124* 107* 87* 80* 73* 73* 68*  MCV 91.8 91.2 93.9 96.6 93.2 90.0 90.8  MCH 28.2 28.4 28.5 28.2 29.7 28.3 28.6  MCHC 30.7 31.1 30.4 29.2* 31.9 31.4 31.6  RDW 13.7 13.6 14.0 14.4 14.7 14.6 14.7  LYMPHSABS 0.6* 0.1*  --   --   --  0.2*  --   MONOABS 0.6 0.0*  --   --   --  0.5  --   EOSABS 0.2 0.0  --   --   --  0.3  --   BASOSABS 0.0 0.0  --   --   --  0.0  --     Chemistries  Recent Labs  Lab 07/06/18 1908 07/07/18 0257 07/07/18 1139 07/08/18 0809 07/08/18 1626 07/09/18 0437 07/10/18 0317  NA 136 139 139 139 141 142 145  K 5.9* 5.6* 5.5* 5.5* 5.1 4.5 3.9  CL 105 106 109 110 113* 114* 111  CO2 20* 20* 18* 16* 17* 18* 25  GLUCOSE 136* 87 94 108* 119* 136* 82  BUN 43* 47* 54* 74* 78* 83* 62*  CREATININE 3.01* 3.72* 4.24* 5.32* 5.12* 5.39* 4.70*  CALCIUM 8.8* 7.9* 8.0* 7.5* 7.4* 7.1* 7.2*  AST 70* 320* 161* 80*  --  57*  --   ALT 46* 216* 165* 117*  --  79*  --   ALKPHOS 102 93 84 78  --  78  --   BILITOT 0.9 1.0 1.1 0.9  --  0.8  --    ------------------------------------------------------------------------------------------------------------------ No results for input(s): CHOL, HDL, LDLCALC, TRIG, CHOLHDL, LDLDIRECT in the last 72 hours.  Lab Results   Component Value Date   HGBA1C 6.5 (H) 05/22/2018   ------------------------------------------------------------------------------------------------------------------ Recent Labs    07/09/18 0730  TSH 3.098   ------------------------------------------------------------------------------------------------------------------ No results for input(s): VITAMINB12, FOLATE, FERRITIN, TIBC, IRON, RETICCTPCT in the last 72 hours.  Coagulation profile No results for input(s): INR, PROTIME in the last 168 hours.  No results for input(s): DDIMER in the last 72 hours.  Cardiac Enzymes No results for input(s): CKMB, TROPONINI, MYOGLOBIN in the last 168 hours.  Invalid input(s): CK ------------------------------------------------------------------------------------------------------------------    Component Value Date/Time   BNP 554.5 (H) 07/06/2018 1918    Micro Results Recent Results (from the past 240 hour(s))  Blood Culture (routine x 2)     Status: Abnormal (Preliminary result)   Collection Time: 07/06/18  7:07 PM  Result Value Ref Range Status   Specimen Description BLOOD RIGHT FOREARM  Final   Special Requests   Final    BOTTLES DRAWN AEROBIC AND ANAEROBIC Blood Culture adequate volume   Culture  Setup Time   Final    GRAM NEGATIVE RODS ANAEROBIC BOTTLE ONLY CRITICAL VALUE NOTED.  VALUE IS CONSISTENT WITH PREVIOUSLY REPORTED AND CALLED VALUE.    Culture (A)  Final    ESCHERICHIA COLI SUSCEPTIBILITIES PERFORMED ON PREVIOUS CULTURE WITHIN THE LAST 5 DAYS. Performed at [facility], [place] [address], [place], [place] [address]    Report Status PENDING  Incomplete  Blood Culture (routine x 2)     Status: Abnormal   Collection Time: 07/06/18  7:40 PM  Result Value Ref Range Status   Specimen Description BLOOD LEFT ANTECUBITAL  Final   Special Requests   Final    BOTTLES DRAWN AEROBIC AND ANAEROBIC Blood Culture adequate volume  Culture  Setup Time   Final    GRAM NEGATIVE  RODS IN BOTH AEROBIC AND ANAEROBIC BOTTLES CRITICAL RESULT CALLED TO, READ BACK BY AND VERIFIED WITHRonnald Nian [id number] FCP Performed at [facility], [place] [address], [place], [place] [address]    Culture ESCHERICHIA COLI (A)  Final   Report Status 07/09/2018 FINAL  Final   Organism ID, Bacteria ESCHERICHIA COLI  Final      Susceptibility   Escherichia coli - MIC*    AMPICILLIN >=32 RESISTANT Resistant     CEFAZOLIN 16 SENSITIVE Sensitive     CEFEPIME <=1 SENSITIVE Sensitive     CEFTAZIDIME <=1 SENSITIVE Sensitive     CEFTRIAXONE <=1 SENSITIVE Sensitive     CIPROFLOXACIN <=0.25 SENSITIVE Sensitive     GENTAMICIN <=1 SENSITIVE Sensitive     IMIPENEM <=0.25 SENSITIVE Sensitive     TRIMETH/SULFA <=20 SENSITIVE Sensitive     AMPICILLIN/SULBACTAM >=32 RESISTANT Resistant     PIP/TAZO <=4 SENSITIVE Sensitive     Extended ESBL NEGATIVE Sensitive     * ESCHERICHIA COLI  Blood Culture ID Panel (Reflexed)     Status: Abnormal   Collection Time: 07/06/18  7:40 PM  Result Value Ref Range Status   Enterococcus species NOT DETECTED NOT DETECTED Final   Listeria monocytogenes NOT DETECTED NOT DETECTED Final   Staphylococcus species NOT DETECTED NOT DETECTED Final   Staphylococcus aureus (BCID) NOT DETECTED NOT DETECTED Final   Streptococcus species NOT DETECTED NOT DETECTED Final   Streptococcus agalactiae NOT DETECTED NOT DETECTED Final   Streptococcus pneumoniae NOT DETECTED NOT DETECTED Final   Streptococcus pyogenes NOT DETECTED NOT DETECTED Final   Acinetobacter baumannii NOT DETECTED NOT DETECTED Final   Enterobacteriaceae species DETECTED (A) NOT DETECTED Final    Comment: Enterobacteriaceae represent a large family of gram-negative bacteria, not a single organism. CRITICAL RESULT CALLED TO, READ BACK BY AND VERIFIED WITH: PHARMD MANCHIL, B [id number] FCP    Enterobacter cloacae complex NOT DETECTED NOT DETECTED Final   Escherichia coli DETECTED (A) NOT DETECTED  Final    Comment: CRITICAL RESULT CALLED TO, READ BACK BY AND VERIFIED WITH: PHARMD MANCHIL, B [phone] FCP    Klebsiella oxytoca NOT DETECTED NOT DETECTED Final   Klebsiella pneumoniae NOT DETECTED NOT DETECTED Final   Proteus species NOT DETECTED NOT DETECTED Final   Serratia marcescens NOT DETECTED NOT DETECTED Final   Carbapenem resistance NOT DETECTED NOT DETECTED Final   Haemophilus influenzae NOT DETECTED NOT DETECTED Final   Neisseria meningitidis NOT DETECTED NOT DETECTED Final   Pseudomonas aeruginosa NOT DETECTED NOT DETECTED Final   Candida albicans NOT DETECTED NOT DETECTED Final   Candida glabrata NOT DETECTED NOT DETECTED Final   Candida krusei NOT DETECTED NOT DETECTED Final   Candida parapsilosis NOT DETECTED NOT DETECTED Final   Candida tropicalis NOT DETECTED NOT DETECTED Final    Comment: Performed at [facility], [place] [address], [place], [name] [address]  MRSA PCR Screening     Status: None   Collection Time: 07/07/18  3:24 PM  Result Value Ref Range Status   MRSA by PCR NEGATIVE NEGATIVE Final    Comment:        The GeneXpert MRSA Assay (FDA approved for NASAL specimens only), is one component of a comprehensive MRSA colonization surveillance program. It is not intended to diagnose MRSA infection nor to guide or monitor treatment for MRSA infections. Performed at [facility],  [address] [place], [place] [address]   Culture, Urine     Status: None   Collection Time: 07/08/18  7:47 AM  Result Value Ref Range Status   Specimen Description URINE, RANDOM  Final   Special Requests NONE  Final   Culture   Final    NO GROWTH Performed at [facility], [place] [address] [place], [place] [address]    Report Status 07/09/2018 FINAL  Final  Culture, blood (Routine X 2) w Reflex to ID Panel     Status: None (Preliminary result)   Collection Time: 07/09/18 10:18 AM  Result Value Ref Range Status   Specimen Description BLOOD RIGHT HAND   Final   Special Requests   Final    BOTTLES DRAWN AEROBIC AND ANAEROBIC Blood Culture adequate volume   Culture   Final    NO GROWTH < 24 HOURS Performed at [facility], [place] [address], [place], [place] [address]    Report Status PENDING  Incomplete  Culture, blood (Routine X 2) w Reflex to ID Panel     Status: None (Preliminary result)   Collection Time: 07/09/18 10:25 AM  Result Value Ref Range Status   Specimen Description BLOOD LEFT HAND  Final   Special Requests   Final    BOTTLES DRAWN AEROBIC AND ANAEROBIC Blood Culture adequate volume   Culture   Final    NO GROWTH < 24 HOURS Performed at [facility], [place] [address], [place], [place] [address]    Report Status PENDING  Incomplete  C difficile quick scan w PCR reflex     Status: Abnormal   Collection Time: 07/09/18 10:38 AM  Result Value Ref Range Status   C Diff antigen POSITIVE (A) NEGATIVE Final   C Diff toxin NEGATIVE NEGATIVE Final   C Diff interpretation Results are indeterminate. See PCR results.  Final    Comment: Performed at [facility], [place] [address] [place], [place] [address]  C. Diff by PCR, Reflexed     Status: Abnormal   Collection Time: 07/09/18 10:38 AM  Result Value Ref Range Status   Toxigenic C. Difficile by PCR POSITIVE (A) NEGATIVE Final    Comment: Positive for toxigenic C. difficile with little to no toxin production. Only treat if clinical presentation suggests symptomatic illness. Performed at [facility], [place] [address] [place], [place] [address]     Radiology Reports Dg Chest 1 View  Result Date: 07/07/2018 CLINICAL DATA:  68 year old male with history of tachycardia. EXAM: CHEST  1 VIEW COMPARISON:  Chest x-ray 07/06/2018. FINDINGS: Status post median sternotomy. External defibrillator pad projecting over the lower left hemithorax. Left-sided pacemaker/AICD with lead tip projecting over the expected location of the right ventricular apex. Lung volumes are  slightly low. No acute consolidative airspace disease. Small left pleural effusion. No right pleural effusion. There is cephalization of the pulmonary vasculature and slight indistinctness of the interstitial markings suggestive of mild pulmonary edema. Mild cardiomegaly. The patient is rotated to the left on today's exam, resulting in distortion of the mediastinal contours and reduced diagnostic sensitivity and specificity for mediastinal pathology. Aortic atherosclerosis. IMPRESSION: 1. Mild interstitial pulmonary edema and small left pleural effusion in the setting of mild cardiomegaly; imaging findings concerning for congestive heart failure. 2. Aortic atherosclerosis. 3. Postoperative changes and support apparatus, as above. Electronically Signed   By: Vinnie Langton M.D.   On: 07/07/2018 11:54   Ct Head Wo  Contrast  Result Date: 07/06/2018 CLINICAL DATA:  Altered mental status. History of HIV, hypertension, hypercholesterolemia and diabetes. EXAM: CT HEAD WITHOUT CONTRAST TECHNIQUE: Contiguous axial images were obtained from the base of the skull through the vertex without intravenous contrast. COMPARISON:  CT HEAD July 21, 2016 FINDINGS: BRAIN: No intraparenchymal hemorrhage, mass effect, midline shift or acute large vascular territory infarcts. Old small RIGHT cerebellar and pontine infarcts. RIGHT temporal occipital encephalomalacia. New subcentimeter RIGHT internal capsule versus basal ganglia hypodensities. Old RIGHT basal ganglia infarct with mild ex vacuo dilatation RIGHT lateral ventricle. Old small bilateral thalami and LEFT basal ganglia infarcts. Confluent supratentorial white matter hypodensities. Moderate parenchymal brain volume loss. No hydrocephalus. VASCULAR: Moderate calcific atherosclerosis of the carotid siphons. SKULL: No skull fracture. No significant scalp soft tissue swelling. SINUSES/ORBITS: Mild paranasal sinus mucosal thickening. Chronic dehiscence of the RIGHT  posterolateral maxillary wall. Mastoid air cells are well aerated.The included ocular globes and orbital contents are non-suspicious. OTHER: Bilateral parotid sialoliths. IMPRESSION: 1. New age indeterminate RIGHT basal ganglia small infarcts. 2. Old RIGHT temporal occipital/PCA territory infarct. 3. Old basal ganglia, thalami, pontine and cerebellar small infarcts. Moderate to severe chronic small vessel ischemic changes. 4. Moderate parenchymal brain volume loss, advanced for age. Electronically Signed   By: Elon Alas M.D.   On: 07/06/2018 22:16   US Renal  Result Date: 07/07/2018 CLINICAL DATA:  Acute kidney injury, history cardiomyopathy, chronic systolic heart failure, stage III chronic kidney disease, hypertension, COPD, former smoker EXAM: RENAL / URINARY TRACT ULTRASOUND COMPLETE COMPARISON:  09/05/2016 FINDINGS: Right Kidney: Renal measurements: 10.9.7 x 6.1 cm = volume: 200 mL. Cortical thinning. Increased cortical echogenicity. Tiny mid renal cyst 8 x 6.9 mm. Additional peripelvic cyst 3.0 x 3.2 x 3.1 cm, simple features. No additional mass or hydronephrosis. No shadowing calculi. Left Kidney: Renal measurements: 12.3 x 6.6 x 6.6 cm = volume: 276 mL. Cortical thinning. Increased cortical echogenicity. Small cyst at inferior pole 1.0 x 0.9 x 1.2 cm. Additional small cyst at inferior pole 2.0 x 1.6 x 1.9 cm, simple features. No additional mass, hydronephrosis, or shadowing calcification. Bladder: Decompressed, unable to evaluate. IMPRESSION: Medical renal disease changes of both kidneys. Small renal cysts in both kidneys. No evidence of hydronephrosis. Electronically Signed   By: Lavonia Dana M.D.   On: 07/07/2018 19:30   Ir Fluoro Guide Cv Line Right  Result Date: 07/09/2018 INDICATION: Acute uremia, acute kidney injury, no current access for dialysis EXAM: Ultrasound guidance for vascular access Right IJ temporary dialysis catheter (Mahurkar catheter) MEDICATIONS: 1% lidocaine local  ANESTHESIA/SEDATION: Moderate Sedation Time: None. The patient's level of consciousness and vital signs were monitored continuously by radiology nursing throughout the procedure under my direct supervision. FLUOROSCOPY TIME:  Fluoroscopy Time: 0 minutes 24 seconds (2 mGy). COMPLICATIONS: None immediate. PROCEDURE: Emergency consent was obtained from the patient's physician after a thorough discussion of the procedural risks, benefits and alternatives. All questions were addressed. Maximal Sterile Barrier Technique was utilized including caps, mask, sterile gowns, sterile gloves, sterile drape, hand hygiene and skin antiseptic. A timeout was performed prior to the initiation of the procedure. Under sterile conditions and local anesthesia, ultrasound micropuncture access performed of the right internal jugular vein. Images obtained for documentation of the patent jugular vein. Guidewire advanced easily. Measurements obtained. Four [place] dilator advanced. Amplatz guidewire advanced into the IVC. Tract dilatation performed to insert a temporary dialysis catheter. Tip position at the SVC RA junction. Images obtained for documentation. Catheter secured Prolene sutures. Blood aspirated  easily followed by saline and heparin flushes. Appropriate volume and strength of heparin instilled in all lumens. External caps applied. Catheter secured with Prolene sutures and a sterile dressing. No immediate complication. Patient tolerated the procedure well. IMPRESSION: Successful ultrasound and fluoroscopic right IJ temporary dialysis catheter. Tip SVC RA junction. Ready for use. Electronically Signed   By: Jerilynn Mages.  Shick M.D.   On: 07/09/2018 13:06   Ir US Guide Vasc Access Right  Result Date: 07/09/2018 INDICATION: Acute uremia, acute kidney injury, no current access for dialysis EXAM: Ultrasound guidance for vascular access Right IJ temporary dialysis catheter (Mahurkar catheter) MEDICATIONS: 1% lidocaine local ANESTHESIA/SEDATION:  Moderate Sedation Time: None. The patient's level of consciousness and vital signs were monitored continuously by radiology nursing throughout the procedure under my direct supervision. FLUOROSCOPY TIME:  Fluoroscopy Time: 0 minutes 24 seconds (2 mGy). COMPLICATIONS: None immediate. PROCEDURE: Emergency consent was obtained from the patient's physician after a thorough discussion of the procedural risks, benefits and alternatives. All questions were addressed. Maximal Sterile Barrier Technique was utilized including caps, mask, sterile gowns, sterile gloves, sterile drape, hand hygiene and skin antiseptic. A timeout was performed prior to the initiation of the procedure. Under sterile conditions and local anesthesia, ultrasound micropuncture access performed of the right internal jugular vein. Images obtained for documentation of the patent jugular vein. Guidewire advanced easily. Measurements obtained. Four [place] dilator advanced. Amplatz guidewire advanced into the IVC. Tract dilatation performed to insert a temporary dialysis catheter. Tip position at the SVC RA junction. Images obtained for documentation. Catheter secured Prolene sutures. Blood aspirated easily followed by saline and heparin flushes. Appropriate volume and strength of heparin instilled in all lumens. External caps applied. Catheter secured with Prolene sutures and a sterile dressing. No immediate complication. Patient tolerated the procedure well. IMPRESSION: Successful ultrasound and fluoroscopic right IJ temporary dialysis catheter. Tip SVC RA junction. Ready for use. Electronically Signed   By: Jerilynn Mages.  Shick M.D.   On: 07/09/2018 13:06   Dg Chest Port 1 View  Result Date: 07/06/2018 CLINICAL DATA:  Sepsis EXAM: PORTABLE CHEST 1 VIEW COMPARISON:  Portable exam 2023 hours compared to 12/09/2016 FINDINGS: LEFT subclavian AICD with lead projecting over RIGHT ventricle. External pacing lead present. Enlargement of cardiac silhouette with  postsurgical changes of CABG. Atherosclerotic calcification aorta. Mediastinal contours normal. Subsegmental atelectasis LEFT lower lobe. Pulmonary vascular congestion with interstitial prominence at the mid to lower lungs which could represent minimal pulmonary edema. No pleural effusion or pneumothorax. Bones demineralized. IMPRESSION: Enlargement of cardiac silhouette with vascular congestion and question minimal pulmonary edema. Electronically Signed   By: Lavonia Dana M.D.   On: 07/06/2018 20:35    Time Spent in minutes  30   Lala Lund M.D on 07/10/2018 at 10:54 AM  To page go to [email or web address] - password [email or web address]

## 2018-07-10 NOTE — Progress Notes (Addendum)
ANTICOAGULATION CONSULT NOTE - Initial Consult  Pharmacy Consult for Heparin   Indication: new onset atrial fibrillation  No Known Allergies  Patient Measurements: Height: 5\' 3"  (160 cm) Weight: 171 lb 15.3 oz (78 kg) IBW/kg (Calculated) : 56.9 Heparin Dosing Weight: 73.2 kg  Vital Signs: Temp: 98.2 F (36.8 C) (02/11 1200) Temp Source: Oral (02/11 1200) BP: 119/77 (02/11 1500)  Labs: Recent Labs    07/08/18 0809 07/08/18 1626 07/09/18 0437 07/10/18 0317  HGB 13.1  --  12.5* 12.4*  HCT 41.1  --  39.8 39.3  PLT 73*  --  73* 68*  CREATININE 5.32* 5.12* 5.39* 4.70*    Estimated Creatinine Clearance: 14.3 mL/min (A) (by C-G formula based on SCr of 4.7 mg/dL (H)).   Medical History: Past Medical History:  Diagnosis Date  . AICD (automatic cardioverter/defibrillator) present   . Alcoholism in remission ([place])   . Anemia   . Aortic insufficiency   . Arthritis   . At moderate risk for fall   . Bell's palsy   . Chronic systolic heart failure (HCC)    NYHA Class III  . CKD (chronic kidney disease), stage IV ([place])   . COPD (chronic obstructive pulmonary disease) ([place])   . Essential hypertension, benign   . HIV disease ([place]) 09/06/2016  . Hyperlipidemia   . NICM (nonischemic cardiomyopathy) ([place])   . Noncompliance with medication regimen   . NSVT (nonsustained ventricular tachycardia) ([place])   . Numbness of right jaw    Had a stoke in 01/2013. Numbness is occasional, especially when trying to chew.  . OSA (obstructive sleep apnea)   . Productive cough 08/2013   With brown sputum   . S/P aortic valve replacement with bioprosthetic valve   . Stroke ([place]) 01/2013   weakness of right side from CVA  . Type 2 diabetes mellitus ([place])   . Uses hearing aid 2014   recently received new hearing aids    Medications:  Medications Prior to Admission  Medication Sig Dispense Refill Last Dose  . acetaminophen (TYLENOL) 325 MG tablet Take 2 tablets (650 mg total) by mouth every 4  (four) hours as needed for mild pain (or temp > 37.5 C (99.5 F)).   unk  . aspirin 325 MG tablet Take 1 tablet (325 mg total) by mouth daily.   07/06/2018 at Unknown time  . atorvastatin (LIPITOR) 80 MG tablet Take 80 mg by mouth daily.    07/05/2018 at Unknown time  . bictegravir-emtricitabine-tenofovir AF (BIKTARVY) 50-200-25 MG TABS tablet Take 1 tablet by mouth daily. 30 tablet 11 07/06/2018 at Unknown time  . carvedilol (COREG) 3.125 MG tablet Take 1 tablet (3.125 mg total) by mouth 2 (two) times daily with a meal. 60 tablet 12 07/06/2018 at 1700  . Cholecalciferol (VITAMIN D3) 2000 UNITS TABS Take 2,000 mg by mouth daily.   07/06/2018 at Unknown time  . coal tar (NEUTROGENA T-GEL) 0.5 % shampoo Apply to scalp on shower days (Twice a week) for dandruff and dry scalp Wed., and Sat.   07/04/2018  . digoxin (LANOXIN) 0.125 MG tablet TAKE 1/2 TABLET BY MOUTH DAILY 15 tablet 3 07/06/2018 at Unknown time  . docusate sodium (COLACE) 100 MG capsule Take 100 mg by mouth daily.   07/05/2018 at Unknown time  . isosorbide mononitrate (IMDUR) 30 MG 24 hr tablet Take 30 mg by mouth daily.    07/06/2018 at Unknown time  . Multiple Vitamin (MULTIVITAMIN WITH MINERALS) TABS tablet Take 1 tablet by  mouth daily.   07/06/2018 at Unknown time  . ranitidine (ZANTAC) 150 MG tablet Take 150 mg by mouth at bedtime.   07/05/2018 at Unknown time  . senna-docusate (SENEXON-S) 8.6-50 MG tablet Take 1 tablet by mouth daily.   07/05/2018 at Unknown time  . sitaGLIPtin (JANUVIA) 50 MG tablet Take 50 mg by mouth daily.   07/06/2018 at Unknown time  . tamsulosin (FLOMAX) 0.4 MG CAPS capsule Take 0.4 mg by mouth daily.   07/06/2018 at Unknown time    Assessment: 68 y.o male with new onset afib with RVR. CHA2DS2-Vasc score 5..  Also has  AKI on CKD stage 4,  SCr 5.32 >5.12 >5.39> 4.7 (baseline ~2.4).  He is being treated with Ceftriaxone  for  E. coli bacteremia. Source unclear- now meningitis rule out.  Now s/p LP Today ~14:30.   Dr. Candiss Norse ordered to  start IV heparin tonight at 22:00 (no bolus) which is 7.5 hours after LP done.  Dr. Candiss Norse plans to start warfarin tomorrow.  Hgb 12.4 stable, Pltc 124 on admit date 2/7 has trended down to 73> 73>68k , MD is aware.   Hgb 12.5>12.4 stable. No bleeding noted, confirmed with RN.  Will start IV heparin infusion at reduced dose.  No bolus per MD order.    Goal of Therapy:  Heparin level 0.3-0.7 units/ml Monitor platelets by anticoagulation protocol: Yes   Plan:  At 22:00 tonight start IV heparin drip (no bolus) at rate of 850 units/hr Check 6 hour heparin level.  Daily heparin level and CBC  Thank you for allowing pharmacy to be part of this patients care team. Nicole Cella, [place] Pharmacist [phone] Please check AMION for all [place] phone numbers After 10:00 PM, call [place] [phone] 07/10/2018,4:27 PM

## 2018-07-10 NOTE — Progress Notes (Signed)
[facility]    NEPHROLOGY PROGRESS NOTE  SUBJECTIVE:  HD yesterday. UOP picking up - 635mL yesterday.  Able to tell me name and hospital, but other than that no details. Denies complaints.    OBJECTIVE:  Vitals:   07/09/18 2035 07/10/18 0144  BP: 121/73 116/68  Pulse:    Resp:  (!) 23  Temp: 98.2 F (36.8 C) 98.3 F (36.8 C)  SpO2: 96% 94%    Intake/Output Summary (Last 24 hours) at 07/10/2018 1008 Last data filed at 07/10/2018 0700 Gross per 24 hour  Intake 252.2 ml  Output 970 ml  Net -717.8 ml      Genearl: arouses to voice, localizes, answers simple questions. HEENT: MMM  AT anicteric sclera Neck:  No JVD, no adenopathy CV:  Heart RRR  Lungs:  L/S CTA bilaterally Abd:  abd SNT/ND with normal BS GU:  Bladder non-palpable, Foley catheter in place with clear yellow urine Extremities:  No LE edema. Skin:  No skin rash  MEDICATIONS:  . bictegravir-emtricitabine-tenofovir AF  1 tablet Oral Daily  . Chlorhexidine Gluconate Cloth  6 each Topical Q0600  . insulin aspart  0-9 Units Subcutaneous Q4H  . metoprolol tartrate  2.5 mg Intravenous Q8H       LABS:   CBC Latest Ref Rng & Units 07/10/2018 07/09/2018 07/08/2018  WBC 4.0 - 10.5 K/uL 11.8(H) 16.0(H) 13.8(H)  Hemoglobin 13.0 - 17.0 g/dL 12.4(L) 12.5(L) 13.1  Hematocrit 39.0 - 52.0 % 39.3 39.8 41.1  Platelets 150 - 400 K/uL 68(L) 73(L) 73(L)    CMP Latest Ref Rng & Units 07/10/2018 07/09/2018 07/08/2018  Glucose 70 - 99 mg/dL 82 136(H) 119(H)  BUN 8 - 23 mg/dL 62(H) 83(H) 78(H)  Creatinine 0.61 - 1.24 mg/dL 4.70(H) 5.39(H) 5.12(H)  Sodium 135 - 145 mmol/L 145 142 141  Potassium 3.5 - 5.1 mmol/L 3.9 4.5 5.1  Chloride 98 - 111 mmol/L 111 114(H) 113(H)  CO2 22 - 32 mmol/L 25 18(L) 17(L)  Calcium 8.9 - 10.3 mg/dL 7.2(L) 7.1(L) 7.4(L)  Total Protein 6.5 - 8.1 g/dL - 5.9(L) -  Total Bilirubin 0.3 - 1.2 mg/dL - 0.8 -  Alkaline Phos 38 - 126 U/L - 78 -  AST 15 - 41 U/L - 57(H) -  ALT 0 - 44 U/L -  79(H) -    Lab Results  Component Value Date   PTH 52 04/17/2018   CALCIUM 7.2 (L) 07/10/2018   CAION 1.27 (H) 11/28/2015   PHOS 3.4 04/17/2018       Component Value Date/Time   COLORURINE AMBER (A) 07/08/2018 0839   APPEARANCEUR CLOUDY (A) 07/08/2018 0839   LABSPEC 1.016 07/08/2018 0839   PHURINE 5.0 07/08/2018 0839   GLUCOSEU NEGATIVE 07/08/2018 0839   HGBUR SMALL (A) 07/08/2018 0839   BILIRUBINUR NEGATIVE 07/08/2018 0839   KETONESUR 5 (A) 07/08/2018 0839   PROTEINUR 100 (A) 07/08/2018 0839   UROBILINOGEN 0.2 10/23/2013 1545   NITRITE NEGATIVE 07/08/2018 0839   LEUKOCYTESUR LARGE (A) 07/08/2018 0839      Component Value Date/Time   PHART 7.346 (L) 07/08/2018 1235   PCO2ART 32.0 07/08/2018 1235   PO2ART 86.4 07/08/2018 1235   HCO3 17.0 (L) 07/08/2018 1235   TCO2 25 11/28/2015 1304   ACIDBASEDEF 7.5 (H) 07/08/2018 1235   O2SAT 95.9 07/08/2018 1235       Component Value Date/Time   IRON 40 (L) 04/17/2018 0734   TIBC 260 04/17/2018 0734   FERRITIN 39 04/17/2018 0734   IRONPCTSAT  15 (L) 04/17/2018 0734       ASSESSMENT/PLAN:      1.  Chronic kidney disease stage IV with a baseline serum creatinine around 2.4.  I suspect his chronic kidney disease on the basis of longstanding hypertension, diabetes, atherosclerotic cardiovascular disease, and possibly HIV.  2.  Acute kidney injury.  I suspect this is on the basis of sepsis and decreased renal perfusion.  He was dialyzed yesterday due to AMS.   His urine output is slowly picking up.  His mental status is improved (though not great).  Hold HD today and continue it PRN.      3.  Sepsis secondary to E coli in urine and blood.  Continue antibiotics per ID.  TEE planned as well.    4.  Metabolic encephalopathy.  Likely secondary to sepsis.    Improved compared to yesterday.  5.  Hyperkalemia.  resolved   [name] MD

## 2018-07-11 ENCOUNTER — Other Ambulatory Visit (HOSPITAL_COMMUNITY): Payer: Medicare HMO

## 2018-07-11 ENCOUNTER — Inpatient Hospital Stay (HOSPITAL_COMMUNITY): Payer: Medicare HMO

## 2018-07-11 DIAGNOSIS — N184 Chronic kidney disease, stage 4 (severe): Secondary | ICD-10-CM

## 2018-07-11 LAB — HSV DNA BY PCR (REFERENCE LAB)
HSV 1 DNA: NEGATIVE
HSV 2 DNA: NEGATIVE

## 2018-07-11 LAB — BASIC METABOLIC PANEL
Anion gap: 11 (ref 5–15)
BUN: 73 mg/dL — ABNORMAL HIGH (ref 8–23)
CALCIUM: 7.7 mg/dL — AB (ref 8.9–10.3)
CO2: 26 mmol/L (ref 22–32)
Chloride: 107 mmol/L (ref 98–111)
Creatinine, Ser: 5.08 mg/dL — ABNORMAL HIGH (ref 0.61–1.24)
GFR calc Af Amer: 13 mL/min — ABNORMAL LOW (ref >60)
GFR calc non Af Amer: 11 mL/min — ABNORMAL LOW (ref >60)
Glucose, Bld: 114 mg/dL — ABNORMAL HIGH (ref 70–99)
Potassium: 3.9 mmol/L (ref 3.5–5.1)
Sodium: 144 mmol/L (ref 135–145)

## 2018-07-11 LAB — CULTURE, BLOOD (ROUTINE X 2): Special Requests: ADEQUATE

## 2018-07-11 LAB — GLUCOSE, CAPILLARY
GLUCOSE-CAPILLARY: 64 mg/dL — AB (ref 70–99)
Glucose-Capillary: 94 mg/dL (ref 70–99)
Glucose-Capillary: 69 mg/dL — ABNORMAL LOW (ref 70–99)
Glucose-Capillary: 95 mg/dL (ref 70–99)
Glucose-Capillary: 153 mg/dL — ABNORMAL HIGH (ref 70–99)
Glucose-Capillary: 91 mg/dL (ref 70–99)

## 2018-07-11 LAB — CBC
HCT: 40.8 % (ref 39.0–52.0)
Hemoglobin: 12.7 g/dL — ABNORMAL LOW (ref 13.0–17.0)
MCH: 29.1 pg (ref 26.0–34.0)
MCHC: 31.1 g/dL (ref 30.0–36.0)
MCV: 93.4 fL (ref 80.0–100.0)
PLATELETS: 56 10*3/uL — AB (ref 150–400)
RBC: 4.37 MIL/uL (ref 4.22–5.81)
RDW: 14.8 % (ref 11.5–15.5)
WBC: 4.5 10*3/uL (ref 4.0–10.5)
nRBC: 0 % (ref 0.0–0.2)

## 2018-07-11 LAB — HEPARIN LEVEL (UNFRACTIONATED)
Heparin Unfractionated: <0.1 IU/mL — ABNORMAL LOW (ref 0.30–0.70)
Heparin Unfractionated: 0.37 IU/mL (ref 0.30–0.70)

## 2018-07-11 LAB — MAGNESIUM: Magnesium: 1.9 mg/dL (ref 1.7–2.4)

## 2018-07-11 LAB — VDRL, CSF: VDRL Quant, CSF: NONREACTIVE

## 2018-07-11 MED ORDER — HEPARIN SODIUM (PORCINE) 1000 UNIT/ML IJ SOLN
INTRAMUSCULAR | Status: AC
  Filled 2018-07-11: qty 3

## 2018-07-11 MED ORDER — DEXTROSE 50 % IV SOLN
INTRAVENOUS | Status: AC
  Administered 2018-07-11: 12.5 mL
  Filled 2018-07-11: qty 50

## 2018-07-11 MED ORDER — CHLORHEXIDINE GLUCONATE CLOTH 2 % EX PADS
6.0000 | MEDICATED_PAD | Freq: Every day | CUTANEOUS | Status: DC
  Administered 2018-07-12 – 2018-07-16 (×3): 6 via TOPICAL

## 2018-07-11 NOTE — Progress Notes (Addendum)
Pt continues on heparin gtt. Dose/rate changed at 0600 to 57ml/hr. Per pharmacy order.

## 2018-07-11 NOTE — Progress Notes (Signed)
Heparin drip initiated at 2220 per pharmacy order at 5.27ml/hr

## 2018-07-11 NOTE — Progress Notes (Signed)
[facility]    NEPHROLOGY PROGRESS NOTE  SUBJECTIVE:  No particular complaints this AM.  UOP doc as 59mL yesterday.  Dialzying this AM.  Neurology concerned for CVA due to L sided weakness now more apparent that he's more awake and interactive.  CT pending.     OBJECTIVE:  Vitals:   07/11/18 1210 07/11/18 1304  BP: (!) 163/109 133/77  Pulse: 93 91  Resp: (!) 24 20  Temp: 97.8 F (36.6 C) (!) 97.5 F (36.4 C)  SpO2: 99% 100%    Intake/Output Summary (Last 24 hours) at 07/11/2018 1358 Last data filed at 07/11/2018 1210 Gross per 24 hour  Intake 1046.24 ml  Output 2000 ml  Net -953.76 ml      Genearl: comfortably laying at 30 deg, arouses to voice, localizes, answers simple questions. HEENT: MMM [place] AT anicteric sclera Neck:  No JVD, no adenopathy CV:  Heart RRR  Lungs:  L/S CTA bilaterally Abd:  abd SNT/ND with normal BS GU:  Bladder non-palpable, Foley catheter in place Extremities:  No LE edema. Skin:  No skin rash  MEDICATIONS:  . bictegravir-emtricitabine-tenofovir AF  1 tablet Oral Daily  . chlorhexidine  15 mL Mouth Rinse BID  . Chlorhexidine Gluconate Cloth  6 each Topical Q0600  . Chlorhexidine Gluconate Cloth  6 each Topical Q0600  . heparin      . insulin aspart  0-9 Units Subcutaneous Q4H  . mouth rinse  15 mL Mouth Rinse q12n4p  . metoprolol succinate  12.5 mg Oral Daily       LABS:   CBC Latest Ref Rng & Units 07/11/2018 07/10/2018 07/09/2018  WBC 4.0 - 10.5 K/uL 4.5 11.8(H) 16.0(H)  Hemoglobin 13.0 - 17.0 g/dL 12.7(L) 12.4(L) 12.5(L)  Hematocrit 39.0 - 52.0 % 40.8 39.3 39.8  Platelets 150 - 400 K/uL 56(L) 68(L) 73(L)    CMP Latest Ref Rng & Units 07/11/2018 07/10/2018 07/09/2018  Glucose 70 - 99 mg/dL 114(H) 82 136(H)  BUN 8 - 23 mg/dL 73(H) 62(H) 83(H)  Creatinine 0.61 - 1.24 mg/dL 5.08(H) 4.70(H) 5.39(H)  Sodium 135 - 145 mmol/L 144 145 142  Potassium 3.5 - 5.1 mmol/L 3.9 3.9 4.5  Chloride 98 - 111 mmol/L 107 111 114(H)  CO2 22  - 32 mmol/L 26 25 18(L)  Calcium 8.9 - 10.3 mg/dL 7.7(L) 7.2(L) 7.1(L)  Total Protein 6.5 - 8.1 g/dL - - 5.9(L)  Total Bilirubin 0.3 - 1.2 mg/dL - - 0.8  Alkaline Phos 38 - 126 U/L - - 78  AST 15 - 41 U/L - - 57(H)  ALT 0 - 44 U/L - - 79(H)    Lab Results  Component Value Date   PTH 52 04/17/2018   CALCIUM 7.7 (L) 07/11/2018   CAION 1.27 (H) 11/28/2015   PHOS 3.4 04/17/2018       Component Value Date/Time   COLORURINE AMBER (A) 07/08/2018 0839   APPEARANCEUR CLOUDY (A) 07/08/2018 0839   LABSPEC 1.016 07/08/2018 0839   PHURINE 5.0 07/08/2018 0839   GLUCOSEU NEGATIVE 07/08/2018 0839   HGBUR SMALL (A) 07/08/2018 0839   BILIRUBINUR NEGATIVE 07/08/2018 0839   KETONESUR 5 (A) 07/08/2018 0839   PROTEINUR 100 (A) 07/08/2018 0839   UROBILINOGEN 0.2 10/23/2013 1545   NITRITE NEGATIVE 07/08/2018 0839   LEUKOCYTESUR LARGE (A) 07/08/2018 0839      Component Value Date/Time   PHART 7.346 (L) 07/08/2018 1235   PCO2ART 32.0 07/08/2018 1235   PO2ART 86.4 07/08/2018 1235   HCO3 17.0 (  L) 07/08/2018 1235   TCO2 25 11/28/2015 1304   ACIDBASEDEF 7.5 (H) 07/08/2018 1235   O2SAT 95.9 07/08/2018 1235       Component Value Date/Time   IRON 40 (L) 04/17/2018 0734   TIBC 260 04/17/2018 0734   FERRITIN 39 04/17/2018 0734   IRONPCTSAT 15 (L) 04/17/2018 0734       ASSESSMENT/PLAN:      1.  Chronic kidney disease stage IV with a baseline serum creatinine around 2.4.  I suspect his chronic kidney disease on the basis of longstanding hypertension, diabetes, atherosclerotic cardiovascular disease, and possibly HIV.  2.  Acute kidney injury.  I suspect this is on the basis of sepsis and decreased renal perfusion.  He was dialyzed 2/10 due to AMS.  Due to continued poor function and oliguria dialyzed today as well.     3.  Sepsis secondary to E coli in urine and blood.  Continue antibiotics per ID.  TEE planned as well.    4.  Metabolic encephalopathy.  Likely secondary to sepsis.     Improving daily but now concern for possible CVA - neurology following and CT pending  5.  Hyperkalemia.  resolved   [name] MD

## 2018-07-11 NOTE — Progress Notes (Addendum)
ANTICOAGULATION CONSULT NOTE - Follow Up Consult  Pharmacy Consult for heparin Indication: atrial fibrillation  Labs: Recent Labs    07/09/18 0437 07/10/18 0317 07/11/18 0235 07/11/18 0425 07/11/18 1333  HGB 12.5* 12.4*  --  12.7*  --   HCT 39.8 39.3  --  40.8  --   PLT 73* 68*  --  56*  --   HEPARINUNFRC  --   --   --  <0.10* 0.37  CREATININE 5.39* 4.70* 5.08*  --   --     Assessment: 68yo male , heparin level is 0.37, therapeutic on heparin 1100 units/hr for Afib. No bleeding noted.  Goal of Therapy:  Heparin level 0.3-0.7 units/ml   Plan:  Continue heparin gtt at 1100 units/hr and check level in 6 hours to confirm remains therapeutic. Daily HL and CBC.   Nicole Cella, RPh Clinical Pharmacist [phone] Please check AMION for all [place] phone numbers After 10:00 PM, call [place] [phone] 07/11/2018,4:11 PM   Addendum:  Dr. Starla Link has discontinued the IV heparin.  Pltc continues to trend down.  Thrombocytopenia -Questionable cause.  No signs of bleeding.   Plan:  Heparin drip dc'd per MD. Will DC heparin level and daily cbc.  Nicole Cella, RPh Clinical Pharmacist 07/11/2018 4:45 PM

## 2018-07-11 NOTE — Progress Notes (Signed)
SLP Cancellation Note  Patient Details Name: Keith Young MRN: [id number] DOB: 1950-06-25          Attempted to see patient for bedside swallow evaluation, however pt is off unit at HD. Will continue efforts to see pt this afternoon.   Jettie Booze, Student SLP    Jettie Booze 07/11/2018, 9:07 AM

## 2018-07-11 NOTE — Progress Notes (Signed)
Patient admitted for Septic shock. Several episodes of VTs this shift, patient was SOB with congestions. On call provider notified of change in status, lab ordered, c-xray ordered. All ordered has been implemented. Will continue to monitor.

## 2018-07-11 NOTE — Progress Notes (Signed)
    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  After careful review of history and examination, the risks and benefits of transesophageal echocardiogram have been explained including risks of esophageal damage, perforation (1:10,000 risk), bleeding, pharyngeal hematoma as well as other potential complications associated with conscious sedation including aspiration, arrhythmia, respiratory failure and death. Alternatives to treatment were discussed, questions were answered. Patient is willing to proceed.   Pt is scheduled for TEE tomorrow 07/12/18 with Dr. Marlou Porch. NPO at [place] please.  Tami Lin Rohith Fauth, [place]  07/11/2018 5:22 PM

## 2018-07-11 NOTE — Progress Notes (Signed)
Reason for consult: Encephalopathy  Subjective: Patient is awake and oriented today.  Appears to be weak on the left side compared to the right and has a left facial droop.  This appeared to be the case as well yesterday, however given his altered mental status it was unclear if it was actually weaker.  Now that he is more awake, he definitely is weaker on the left side compared to the right.   ROS: negative except above  Examination  Vital signs in last 24 hours: Temp:  [97.7 F (36.5 C)-98.4 F (36.9 C)] 97.8 F (36.6 C) (02/12 1210) Pulse Rate:  [70-93] 93 (02/12 1210) Resp:  [15-25] 24 (02/12 1210) BP: (114-163)/(72-109) 163/109 (02/12 1210) SpO2:  [98 %-100 %] 99 % (02/12 1210) Weight:  [77.9 kg] 77.9 kg (02/12 0901)  General: lying in bed CVS: pulse-normal rate and rhythm RS: breathing comfortably Extremities: normal   Neuro: MS: Alert, oriented, follows commands CN: pupils equal and reactive,  EOMI, left facial droop, tongue midline, normal sensation over face, Motor: 4+/5 strength in RUE 4./5 in RLE, 3+/5 strength in LUE, 3/5 strength in LLE Reflexes: 2+ bilaterally over patella, biceps, plantars: flexor Coordination: normal Gait: not tested  Basic Metabolic Panel: Recent Labs  Lab 07/08/18 0809 07/08/18 1626 07/09/18 0437 07/10/18 0317 07/11/18 0235  NA 139 141 142 145 144  K 5.5* 5.1 4.5 3.9 3.9  CL 110 113* 114* 111 107  CO2 16* 17* 18* 25 26  GLUCOSE 108* 119* 136* 82 114*  BUN 74* 78* 83* 62* 73*  CREATININE 5.32* 5.12* 5.39* 4.70* 5.08*  CALCIUM 7.5* 7.4* 7.1* 7.2* 7.7*  MG  --   --   --   --  1.9    CBC: Recent Labs  Lab 07/06/18 1908  07/07/18 1139 07/08/18 0809 07/09/18 0437 07/10/18 0317 07/11/18 0425  WBC 1.9*   < > 19.7* 13.8* 16.0* 11.8* 4.5  NEUTROABS 1.7  --   --   --  14.9*  --   --   HGB 15.2   < > 12.4* 13.1 12.5* 12.4* 12.7*  HCT 48.8   < > 42.4 41.1 39.8 39.3 40.8  MCV 91.2   < > 96.6 93.2 90.0 90.8 93.4  PLT 107*   < > 80*  73* 73* 68* 56*   < > = values in this interval not displayed.     Coagulation Studies: No results for input(s): LABPROT, INR in the last 72 hours.     ASSESSMENT AND PLAN  68 year old male with past medical history of diabetes, CVA, HIV, hypertension, AICD placement, CKD stage III admitted to [facility] for increased lethargy, fever and hypotension. Also noted to have myoclonusand neurology was consulted. Work-upso far reveals UTI with E. coli as well as significantly elevated BUN.He isalso noted to have neck stiffness on examination he was performed which was negative for infection. Patient's altered mental status improved after receiving dialysis today patient is alert and oriented to place and person.    Does appear to have left-sided weakness as well as left facial droop and therefore will change repeat CT head to rule out stroke.   Acute Metabolic/Uremicencephalopathy  Urosepsis C.Difficule infection vs colonization  Possible right hemispheric stroke  Recommendations  Repeat CT head, if shows new infarct recommend stroke workup  Continue correction of metabolic disturbances Antibiotics per ID recommendations, agree with discontinuing acyclovir and meningitic coverage Continue Keppra for now, however can likely be discontinued over the next few days  as there is low suspicion patient had seizures and myoclonus likely from uremia.     Karena Addison Jeanelle Dake [facility] Pager Number [phone] For questions after 7pm please refer to AMION to reach the Neurologist on call

## 2018-07-11 NOTE — Progress Notes (Signed)
Blood sugar 64, patient treated with remainder of 50% dextrose 72ml. Patient left for radiology. Repeat blood sugar at 1600hrs was 153

## 2018-07-11 NOTE — Progress Notes (Signed)
Patient ID: Keith Young, male   DOB: July 12, 1950, 68 y.o.   MRN: [id number]  PROGRESS NOTE    Keith Young  VQX:[id number] DOB: 06-Apr-1951 DOA: 07/06/2018 PCP: Virgie Dad, MD   Brief Narrative:  29 year old African-American male with history of chronic systolic CHF with EF of 00% with AICD, CKD stage IV, diabetes mellitus type 2, COPD, hypertension, CVA, HIV presented with high fevers, altered mental status with nausea and vomiting on 07/06/2018 and was diagnosed with sepsis.  ID, neurology and nephrology were consulted.  He was initially started on broad-spectrum antibiotics.  Because of acute kidney injury and altered mental status, he was also started on dialysis.  He was found to have E. coli bacteremia.  He had LP done on 07/10/2018.   Assessment & Plan:   Principal Problem:   E coli bacteremia Active Problems:   Chronic kidney disease (CKD), active medical management without dialysis, stage 3 (moderate) (HCC)   Diabetes mellitus type 2 in nonobese [facility])   Essential hypertension, benign   ICD (implantable cardioverter-defibrillator) in place   Acute kidney injury ([place])   HIV disease ([place])   Chronic combined systolic and diastolic heart failure (HCC)   Acute metabolic encephalopathy   Septic shock (HCC)   Encephalitis   ARF (acute renal failure) ([place])   Sepsis with encephalopathy and septic shock (HCC)   Tachycardia   Sepsis present on admission -Antibiotic plan as below.  ID following.  Currently hemodynamically stable  E. coli bacteremia -Questionable cause.  Continue Rocephin.  ID following.  Follow 2D echo.  Acute metabolic/uremic encephalopathy -Patient is more awake today but still slightly confused and slow to respond.  Initially there was a concern for meningitis but LP done on 07/10/2018 showed only 3 WBCs.  Hence patient does not have meningitis.  Acyclovir and meningitis coverage discontinued -There might be a question of stroke.  Neurology following and is  recommending repeat CT of the head as patient cannot undergo MRI of the brain.  Continue Keppra for now  Acute kidney injury on chronic kidney disease stage IV -Baseline creatinine is around 2.5. -Creatinine 5.08 today.  Patient was started on dialysis on 07/09/2018.  Nephrology following.  HIV -Continue current antiretroviral regimen.  ID following  History of chronic systolic CHF with EF of 34%/JZPH -Currently volume being managed by dialysis which was started on 07/09/2018.  Continue metoprolol.  Cardiology following.  Strict input and output.  Daily weights  Essential hypertension -Blood pressure on the higher side now.  Continue metoprolol.  Monitor.  New onset A. fib with RVR -Currently rate controlled.  On heparin drip.  Cardiology following.  Continue metoprolol.  Echo pending  Diabetes mellitus type 2 -Continue Accu-Cheks with coverage.  Hemoglobin A1c was 6.5 on 05/22/2018  Thrombocytopenia -Questionable cause.  No signs of bleeding.  Monitor   DVT prophylaxis: SCDs Code Status: DNR Family Communication: None at bedside Disposition Plan: Depends on clinical outcome  Consultants: Nephrology/neurology/ID/IR/cardiology  Procedures: IR guided HD catheter placement on 07/09/2018 LP on 07/10/2018 by IR  Antimicrobials:  Anti-infectives (From admission, onward)   Start     Dose/Rate Route Frequency Ordered Stop   07/10/18 1430  vancomycin (VANCOCIN) IVPB 1000 mg/200 mL premix  Status:  Discontinued     1,000 mg 200 mL/hr over 60 Minutes Intravenous Every 48 hours 07/08/18 1421 07/09/18 0959   07/10/18 1000  cefTRIAXone (ROCEPHIN) 2 g in sodium chloride 0.9 % 100 mL IVPB     2 g  200 mL/hr over 30 Minutes Intravenous Every 24 hours 07/10/18 0942     07/10/18 0847  cefTRIAXone (ROCEPHIN) 2 g in sodium chloride 0.9 % 100 mL IVPB  Status:  Discontinued     2 g 200 mL/hr over 30 Minutes Intravenous Every 24 hours 07/09/18 1004 07/10/18 0933   07/09/18 1400  acyclovir (ZOVIRAX)  745 mg in dextrose 5 % 150 mL IVPB  Status:  Discontinued     10 mg/kg  74.5 kg 164.9 mL/hr over 60 Minutes Intravenous Every 24 hours 07/08/18 1421 07/09/18 0959   07/09/18 1300  bictegravir-emtricitabine-tenofovir AF (BIKTARVY) 50-200-25 MG per tablet 1 tablet     1 tablet Oral Daily 07/09/18 1012     07/08/18 2300  cefTRIAXone (ROCEPHIN) 2 g in sodium chloride 0.9 % 100 mL IVPB  Status:  Discontinued     2 g 200 mL/hr over 30 Minutes Intravenous Every 12 hours 07/08/18 1341 07/09/18 1004   07/08/18 1800  vancomycin (VANCOCIN) IVPB 1000 mg/200 mL premix  Status:  Discontinued     1,000 mg 200 mL/hr over 60 Minutes Intravenous Every 48 hours 07/06/18 2019 07/07/18 1137   07/08/18 1430  ampicillin (OMNIPEN) 2 g in sodium chloride 0.9 % 100 mL IVPB  Status:  Discontinued     2 g 300 mL/hr over 20 Minutes Intravenous Every 12 hours 07/08/18 1343 07/09/18 0959   07/08/18 1430  vancomycin (VANCOCIN) 1,500 mg in sodium chloride 0.9 % 500 mL IVPB     1,500 mg 250 mL/hr over 120 Minutes Intravenous  Once 07/08/18 1421 07/08/18 1723   07/08/18 1400  acyclovir (ZOVIRAX) 745 mg in dextrose 5 % 150 mL IVPB     10 mg/kg  74.5 kg 164.9 mL/hr over 60 Minutes Intravenous  Once 07/08/18 1345 07/09/18 1315   07/07/18 1800  ceFEPIme (MAXIPIME) 1 g in sodium chloride 0.9 % 100 mL IVPB  Status:  Discontinued     1 g 200 mL/hr over 30 Minutes Intravenous Every 24 hours 07/06/18 2019 07/07/18 1137   07/07/18 1145  cefTRIAXone (ROCEPHIN) 2 g in sodium chloride 0.9 % 100 mL IVPB  Status:  Discontinued     2 g 200 mL/hr over 30 Minutes Intravenous Every 24 hours 07/07/18 1137 07/08/18 1341   07/07/18 1000  bictegravir-emtricitabine-tenofovir AF (BIKTARVY) 50-200-25 MG per tablet 1 tablet  Status:  Discontinued     1 tablet Oral Daily 07/07/18 0058 07/07/18 1127   07/06/18 1900  ceFEPIme (MAXIPIME) 2 g in sodium chloride 0.9 % 100 mL IVPB     2 g 200 mL/hr over 30 Minutes Intravenous  Once 07/06/18 1859  07/06/18 2001   07/06/18 1900  vancomycin (VANCOCIN) IVPB 1000 mg/200 mL premix     1,000 mg 200 mL/hr over 60 Minutes Intravenous  Once 07/06/18 1859 07/06/18 2035        Subjective: Patient seen and examined at bedside undergoing dialysis.  He is awake but very slow to respond and is a poor historian.  No overnight fever or vomiting reported.  Objective: Vitals:   07/11/18 1130 07/11/18 1200 07/11/18 1210 07/11/18 1304  BP: (!) 125/97 (!) 153/90 (!) 163/109 133/77  Pulse: 91 91 93 91  Resp: (!) 24 (!) 25 (!) 24 20  Temp:   97.8 F (36.6 C) (!) 97.5 F (36.4 C)  TempSrc:   Axillary Oral  SpO2: 99% 99% 99% 100%  Weight:   75.4 kg   Height:  Intake/Output Summary (Last 24 hours) at 07/11/2018 1448 Last data filed at 07/11/2018 1210 Gross per 24 hour  Intake 1046.24 ml  Output 2000 ml  Net -953.76 ml   Filed Weights   07/09/18 1709 07/11/18 0901 07/11/18 1210  Weight: 78 kg 77.9 kg 75.4 kg    Examination:  General exam: Appears calm and comfortable.  No distress.  Slightly confused and slow to respond.   Respiratory system: Bilateral decreased breath sounds at bases with scattered crackles Cardiovascular system: S1 & S2 heard, Rate controlled Gastrointestinal system: Abdomen is nondistended, soft and nontender. Normal bowel sounds heard. Extremities: No cyanosis, clubbing; bilateral lower extremity edema   Data Reviewed: I have personally reviewed following labs and imaging studies  CBC: Recent Labs  Lab 07/06/18 1908  07/07/18 1139 07/08/18 0809 07/09/18 0437 07/10/18 0317 07/11/18 0425  WBC 1.9*   < > 19.7* 13.8* 16.0* 11.8* 4.5  NEUTROABS 1.7  --   --   --  14.9*  --   --   HGB 15.2   < > 12.4* 13.1 12.5* 12.4* 12.7*  HCT 48.8   < > 42.4 41.1 39.8 39.3 40.8  MCV 91.2   < > 96.6 93.2 90.0 90.8 93.4  PLT 107*   < > 80* 73* 73* 68* 56*   < > = values in this interval not displayed.   Basic Metabolic Panel: Recent Labs  Lab 07/08/18 0809  07/08/18 1626 07/09/18 0437 07/10/18 0317 07/11/18 0235  NA 139 141 142 145 144  K 5.5* 5.1 4.5 3.9 3.9  CL 110 113* 114* 111 107  CO2 16* 17* 18* 25 26  GLUCOSE 108* 119* 136* 82 114*  BUN 74* 78* 83* 62* 73*  CREATININE 5.32* 5.12* 5.39* 4.70* 5.08*  CALCIUM 7.5* 7.4* 7.1* 7.2* 7.7*  MG  --   --   --   --  1.9   GFR: Estimated Creatinine Clearance: 13 mL/min (A) (by C-G formula based on SCr of 5.08 mg/dL (H)). Liver Function Tests: Recent Labs  Lab 07/06/18 1908 07/07/18 0257 07/07/18 1139 07/08/18 0809 07/09/18 0437  AST 70* 320* 161* 80* 57*  ALT 46* 216* 165* 117* 79*  ALKPHOS 102 93 84 78 78  BILITOT 0.9 1.0 1.1 0.9 0.8  PROT 7.1 6.2* 6.1* 6.1* 5.9*  ALBUMIN 3.0* 2.5* 2.5* 2.3* 2.1*   No results for input(s): LIPASE, AMYLASE in the last 168 hours. Recent Labs  Lab 07/08/18 1204  AMMONIA 33   Coagulation Profile: No results for input(s): INR, PROTIME in the last 168 hours. Cardiac Enzymes: No results for input(s): CKTOTAL, CKMB, CKMBINDEX, TROPONINI in the last 168 hours. BNP (last 3 results) No results for input(s): PROBNP in the last 8760 hours. HbA1C: No results for input(s): HGBA1C in the last 72 hours. CBG: Recent Labs  Lab 07/10/18 2025 07/11/18 0103 07/11/18 0535 07/11/18 0626 07/11/18 1254  GLUCAP 101* 94 69* 95 64*   Lipid Profile: No results for input(s): CHOL, HDL, LDLCALC, TRIG, CHOLHDL, LDLDIRECT in the last 72 hours. Thyroid Function Tests: Recent Labs    07/09/18 0730  TSH 3.098   Anemia Panel: No results for input(s): VITAMINB12, FOLATE, FERRITIN, TIBC, IRON, RETICCTPCT in the last 72 hours. Sepsis Labs: Recent Labs  Lab 07/07/18 1139 07/07/18 1651 07/08/18 1626 07/08/18 1952 07/09/18 0437 07/10/18 0317  PROCALCITON  --   --  >150.00  --  >150.00 >150.00  LATICACIDVEN 3.3* 3.0* 1.6 1.5  --   --  Recent Results (from the past 240 hour(s))  Blood Culture (routine x 2)     Status: Abnormal   Collection Time:  07/06/18  7:07 PM  Result Value Ref Range Status   Specimen Description BLOOD RIGHT FOREARM  Final   Special Requests   Final    BOTTLES DRAWN AEROBIC AND ANAEROBIC Blood Culture adequate volume   Culture  Setup Time   Final    GRAM NEGATIVE RODS ANAEROBIC BOTTLE ONLY CRITICAL VALUE NOTED.  VALUE IS CONSISTENT WITH PREVIOUSLY REPORTED AND CALLED VALUE.    Culture (A)  Final    ESCHERICHIA COLI SUSCEPTIBILITIES PERFORMED ON PREVIOUS CULTURE WITHIN THE LAST 5 DAYS. Performed at [facility], [place] [address] [place], [place] [address]    Report Status 07/11/2018 FINAL  Final  Blood Culture (routine x 2)     Status: Abnormal   Collection Time: 07/06/18  7:40 PM  Result Value Ref Range Status   Specimen Description BLOOD LEFT ANTECUBITAL  Final   Special Requests   Final    BOTTLES DRAWN AEROBIC AND ANAEROBIC Blood Culture adequate volume   Culture  Setup Time   Final    GRAM NEGATIVE RODS IN BOTH AEROBIC AND ANAEROBIC BOTTLES CRITICAL RESULT CALLED TO, READ BACK BY AND VERIFIED WITHRonnald Nian [id number] FCP Performed at [facility], [place] [address] [place], [place] [address]    Culture ESCHERICHIA COLI (A)  Final   Report Status 07/09/2018 FINAL  Final   Organism ID, Bacteria ESCHERICHIA COLI  Final      Susceptibility   Escherichia coli - MIC*    AMPICILLIN >=32 RESISTANT Resistant     CEFAZOLIN 16 SENSITIVE Sensitive     CEFEPIME <=1 SENSITIVE Sensitive     CEFTAZIDIME <=1 SENSITIVE Sensitive     CEFTRIAXONE <=1 SENSITIVE Sensitive     CIPROFLOXACIN <=0.25 SENSITIVE Sensitive     GENTAMICIN <=1 SENSITIVE Sensitive     IMIPENEM <=0.25 SENSITIVE Sensitive     TRIMETH/SULFA <=20 SENSITIVE Sensitive     AMPICILLIN/SULBACTAM >=32 RESISTANT Resistant     PIP/TAZO <=4 SENSITIVE Sensitive     Extended ESBL NEGATIVE Sensitive     * ESCHERICHIA COLI  Blood Culture ID Panel (Reflexed)     Status: Abnormal   Collection Time: 07/06/18  7:40 PM  Result Value  Ref Range Status   Enterococcus species NOT DETECTED NOT DETECTED Final   Listeria monocytogenes NOT DETECTED NOT DETECTED Final   Staphylococcus species NOT DETECTED NOT DETECTED Final   Staphylococcus aureus (BCID) NOT DETECTED NOT DETECTED Final   Streptococcus species NOT DETECTED NOT DETECTED Final   Streptococcus agalactiae NOT DETECTED NOT DETECTED Final   Streptococcus pneumoniae NOT DETECTED NOT DETECTED Final   Streptococcus pyogenes NOT DETECTED NOT DETECTED Final   Acinetobacter baumannii NOT DETECTED NOT DETECTED Final   Enterobacteriaceae species DETECTED (A) NOT DETECTED Final    Comment: Enterobacteriaceae represent a large family of gram-negative bacteria, not a single organism. CRITICAL RESULT CALLED TO, READ BACK BY AND VERIFIED WITH: PHARMD MANCHIL, B [phone] FCP    Enterobacter cloacae complex NOT DETECTED NOT DETECTED Final   Escherichia coli DETECTED (A) NOT DETECTED Final    Comment: CRITICAL RESULT CALLED TO, READ BACK BY AND VERIFIED WITH: PHARMD MANCHIL, B [phone] FCP    Klebsiella oxytoca NOT DETECTED NOT DETECTED Final   Klebsiella pneumoniae NOT DETECTED NOT DETECTED Final   Proteus species NOT DETECTED NOT DETECTED Final  Serratia marcescens NOT DETECTED NOT DETECTED Final   Carbapenem resistance NOT DETECTED NOT DETECTED Final   Haemophilus influenzae NOT DETECTED NOT DETECTED Final   Neisseria meningitidis NOT DETECTED NOT DETECTED Final   Pseudomonas aeruginosa NOT DETECTED NOT DETECTED Final   Candida albicans NOT DETECTED NOT DETECTED Final   Candida glabrata NOT DETECTED NOT DETECTED Final   Candida krusei NOT DETECTED NOT DETECTED Final   Candida parapsilosis NOT DETECTED NOT DETECTED Final   Candida tropicalis NOT DETECTED NOT DETECTED Final    Comment: Performed at [facility], [place] [address] [place], [place] [address]  MRSA PCR Screening     Status: None   Collection Time: 07/07/18  3:24 PM  Result Value Ref Range Status    MRSA by PCR NEGATIVE NEGATIVE Final    Comment:        The GeneXpert MRSA Assay (FDA approved for NASAL specimens only), is one component of a comprehensive MRSA colonization surveillance program. It is not intended to diagnose MRSA infection nor to guide or monitor treatment for MRSA infections. Performed at [facility], [place] [address], [place], [place] [address]   Culture, Urine     Status: None   Collection Time: 07/08/18  7:47 AM  Result Value Ref Range Status   Specimen Description URINE, RANDOM  Final   Special Requests NONE  Final   Culture   Final    NO GROWTH Performed at [facility], [place] [address] [place], [place] [address]    Report Status 07/09/2018 FINAL  Final  Culture, blood (Routine X 2) w Reflex to ID Panel     Status: None (Preliminary result)   Collection Time: 07/09/18 10:18 AM  Result Value Ref Range Status   Specimen Description BLOOD RIGHT HAND  Final   Special Requests   Final    BOTTLES DRAWN AEROBIC AND ANAEROBIC Blood Culture adequate volume   Culture   Final    NO GROWTH 2 DAYS Performed at [facility], [place] [address] [place], [place] [address]    Report Status PENDING  Incomplete  Culture, blood (Routine X 2) w Reflex to ID Panel     Status: None (Preliminary result)   Collection Time: 07/09/18 10:25 AM  Result Value Ref Range Status   Specimen Description BLOOD LEFT HAND  Final   Special Requests   Final    BOTTLES DRAWN AEROBIC AND ANAEROBIC Blood Culture adequate volume   Culture   Final    NO GROWTH 2 DAYS Performed at [facility], [place] [address], [place], [place] [address]    Report Status PENDING  Incomplete  C difficile quick scan w PCR reflex     Status: Abnormal   Collection Time: 07/09/18 10:38 AM  Result Value Ref Range Status   C Diff antigen POSITIVE (A) NEGATIVE Final   C Diff toxin NEGATIVE NEGATIVE Final   C Diff interpretation Results are indeterminate. See PCR results.  Final     Comment: Performed at [facility], [place] [address] [place],  [address]  C. Diff by PCR, Reflexed     Status: Abnormal   Collection Time: 07/09/18 10:38 AM  Result Value Ref Range Status   Toxigenic C. Difficile by PCR POSITIVE (A) NEGATIVE Final    Comment: Positive for toxigenic C. difficile with little to no toxin production. Only treat if clinical presentation suggests symptomatic illness. Performed at [facility], [address] [place], [place] [address]   CSF culture     Status: None (Preliminary result)   Collection Time: 07/10/18  2:44 PM  Result Value Ref Range Status   Specimen Description CSF  Final   Special Requests NONE  Final   Gram Stain   Final    WBC PRESENT,BOTH PMN AND MONONUCLEAR NO ORGANISMS SEEN CYTOSPIN SMEAR    Culture   Final    NO GROWTH < 24 HOURS Performed at [facility], [place] [address] [place], [place] [address]    Report Status PENDING  Incomplete         Radiology Studies: Dg Chest Port 1 View  Result Date: 07/11/2018 CLINICAL DATA:  Shortness of breath EXAM: PORTABLE CHEST 1 VIEW COMPARISON:  07/07/2018 FINDINGS: Left chest wall AICD lead is in unchanged position. There is mild cardiomegaly. Central pulmonary vascular congestion without overt pulmonary edema. No focal airspace consolidation. Small left pleural effusion. No pneumothorax. IMPRESSION: Cardiomegaly and small left pleural effusion without overt pulmonary edema. Electronically Signed   By: Ulyses Jarred M.D.   On: 07/11/2018 03:35   Dg Fluoro Guide Lumbar Puncture  Result Date: 07/10/2018 CLINICAL DATA:  68 year old male with encephalopathy. Subsequent encounter. EXAM: DIAGNOSTIC LUMBAR PUNCTURE UNDER FLUOROSCOPIC GUIDANCE FLUOROSCOPY TIME:  Fluoroscopy Time:  6 seconds PROCEDURE: Patient was not able to give consent. Family (sister) not available by phone. Emergency request/consent from Dr. Candiss Norse. Labs and head CT reviewed. Under fluoroscopic guidance and  aseptic technique (utilizing 1% lidocaine as local anesthetic), L3-4 lumbar puncture was performed with a single pass of a 22 gauge spinal needle. Opening pressure 26 and closing pressure 20 cc water (not felt to be accurate as patient was lying on the stomach and moving throughout the exam). 8.5 cc of clear cerebral spinal fluid collected and sent for labs. Patient tolerated procedure well without any difficulty. IMPRESSION: Successful L3-4 lumbar puncture as noted above. Electronically Signed   By: Genia Del M.D.   On: 07/10/2018 15:12        Scheduled Meds: . bictegravir-emtricitabine-tenofovir AF  1 tablet Oral Daily  . chlorhexidine  15 mL Mouth Rinse BID  . Chlorhexidine Gluconate Cloth  6 each Topical Q0600  . Chlorhexidine Gluconate Cloth  6 each Topical Q0600  . heparin      . insulin aspart  0-9 Units Subcutaneous Q4H  . mouth rinse  15 mL Mouth Rinse q12n4p  . metoprolol succinate  12.5 mg Oral Daily   Continuous Infusions: . cefTRIAXone (ROCEPHIN)  IV 2 g (07/11/18 1340)  . heparin 1,100 Units/hr (07/11/18 0559)  . levETIRAcetam 500 mg (07/11/18 1429)     LOS: 4 days        Aline August, MD [facility] Pager [phone]  If 7PM-7AM, please contact night-coverage [email or web address] Password [facility] 07/11/2018, 2:48 PM

## 2018-07-11 NOTE — Progress Notes (Signed)
Hypoglycemic Event   CBG: 69  Treatment: D50 25 mL (12.5 gm)12.5gm dextrose 50  Symptoms: None none  Follow-up CBG: Time: 0626 CBG Result: 95  Possible Reasons for Event: Inadequate meal intake NPO  Comments/MD notified: Dewaine Oats, NP    [place]

## 2018-07-11 NOTE — Progress Notes (Signed)
PT Cancellation Note  Patient Details Name: Keith Young MRN: [id number] DOB: 1951/05/23   Cancelled Treatment:    Reason Eval/Treat Not Completed: Patient at procedure or test/unavailable. Pt in HD. PT to re-attempt eval as time allows.   Lorriane Shire 07/11/2018, 11:11 AM  Lorrin Goodell, PT  Office # [phone] Pager [phone]

## 2018-07-11 NOTE — Evaluation (Signed)
Clinical/Bedside Swallow Evaluation Patient Details  Name: Keith Young MRN: [id number] Date of Birth: [birth year]  Today's Date: 07/11/2018 Time: SLP Start Time (ACUTE ONLY): 1403 SLP Stop Time (ACUTE ONLY): 1425 SLP Time Calculation (min) (ACUTE ONLY): 22 min  Past Medical History:  Past Medical History:  Diagnosis Date  . AICD (automatic cardioverter/defibrillator) present   . Alcoholism in remission ([place])   . Anemia   . Aortic insufficiency   . Arthritis   . At moderate risk for fall   . Bell's palsy   . Chronic systolic heart failure (HCC)    NYHA Class III  . CKD (chronic kidney disease), stage IV ([place])   . COPD (chronic obstructive pulmonary disease) ([place])   . Essential hypertension, benign   . HIV disease ([place]) 09/06/2016  . Hyperlipidemia   . NICM (nonischemic cardiomyopathy) ([place])   . Noncompliance with medication regimen   . NSVT (nonsustained ventricular tachycardia) ([place])   . Numbness of right jaw    Had a stoke in 01/2013. Numbness is occasional, especially when trying to chew.  . OSA (obstructive sleep apnea)   . Productive cough 08/2013   With brown sputum   . S/P aortic valve replacement with bioprosthetic valve   . Stroke ([place]) 01/2013   weakness of right side from CVA  . Type 2 diabetes mellitus ([place])   . Uses hearing aid 2014   recently received new hearing aids   Past Surgical History:  Past Surgical History:  Procedure Laterality Date  . BIOPSY N/A 04/10/2014   Procedure: GASTRIC BIOPSY;  Surgeon: Daneil Dolin, MD;  Location: [facility];  Service: Endoscopy;  Laterality: N/A;  . BIOPSY  10/12/2015   Procedure: BIOPSY;  Surgeon: Daneil Dolin, MD;  Location: [facility];  Service: Endoscopy;;  stomach bx's  . CARDIAC DEFIBRILLATOR PLACEMENT  11/12/12   [employer] Inogen MINI ICD implanted in [place] at [place] [facility] per Dr Nelly Laurence note  . COLONOSCOPY WITH PROPOFOL N/A 04/10/2014   RMR:  Colonic Diverticulosis  . ESOPHAGOGASTRODUODENOSCOPY (EGD) WITH PROPOFOL N/A 04/10/2014   RMR: Mild erosive reflux esophagitis. Multiple antral polyps likely hyperplastic status post removal by hot snare cautery technique. Diffusely abnormal stomach status post gastric biopsy I suspect some of patients anemaia may be due to intermittent oozing from the stomach. It would be difficult and a risky proposition to attempt complete removal of all of his gastric polyps.  . ESOPHAGOGASTRODUODENOSCOPY (EGD) WITH PROPOFOL N/A 10/12/2015   Procedure: ESOPHAGOGASTRODUODENOSCOPY (EGD) WITH PROPOFOL;  Surgeon: Daneil Dolin, MD;  Location: [facility];  Service: Endoscopy;  Laterality: N/A;  1517 - moved to 9:15  . IR FLUORO GUIDE CV LINE RIGHT  07/09/2018  . IR US GUIDE VASC ACCESS RIGHT  07/09/2018  . POLYPECTOMY N/A 04/10/2014   Procedure: GASTRIC POLYPECTOMY;  Surgeon: Daneil Dolin, MD;  Location: [facility];  Service: Endoscopy;  Laterality: N/A;  . RIGHT HEART CATHETERIZATION N/A 02/06/2014   Procedure: RIGHT HEART CATH;  Surgeon: Larey Dresser, MD;  Location: [facility] CATH LAB;  Service: Cardiovascular;  Laterality: N/A;   HPI:  Keith Young is a 68 y.o. male with PMH significant of HIV, type 2 diabetes, COPD, CVA (61607, HTN, Bells Palsy, systolic CHF, CKD, and history of AICD placement followed by cardiology.  Pt was admitted to [facility] ED 07/07/18 and after presenting with AMS at SNF residence, vomitted, and became increasingly unresponsive. Per PA  note, pt was found to be encephalopathic secondary to sepsis and renal failure. Pt seen by ST in Jan 2018; BSE recc D2/thin. CXR showed cardiomegaly and small left pleural effusion. Head CT revealed new age indeterminate RIGHT basal ganglia small infarcts, old RIGHT temporal occipital/PCA territory infarct, old basal ganglia, thalami, pontine and cerebellar small infarcts.   Assessment / Plan / Recommendation Clinical Impression   Pt was confused but pleasant and  participatory in bedside swallow evaluation today. Mild right asymmetry and oral weakness noted during oral motor examination. Pt's cough was strong but congested. Pt exhibited immediate cough 50% of the time following sips of thin liquid. No overt s/s aspiration were observed during pt's consumption of puree and dysphagia 2 (chopped/minced) texture POs. Given inconsistent s/s of aspiration with thin liquids at bedside, recommend an instrumental MBSS to further assess swallow function. For now, pt may have bites of applesauce with meds (crushed) and sips of thin H2O only after thorough oral care is provided. ST will defer treatment plan until MBSS completed tomorrow.   SLP Visit Diagnosis: Dysphagia, pharyngeal phase (R13.13)    Aspiration Risk  Moderate aspiration risk    Diet Recommendation Free water protocol after oral care;Other (Comment)(bites or puree with meds)   Liquid Administration via: Cup;Straw Medication Administration: Crushed with puree Postural Changes: Seated upright at 90 degrees    Other  Recommendations Oral Care Recommendations: Oral care QID;Oral care prior to ice chip/H20   Follow up Recommendations Skilled Nursing facility      Frequency and Duration            Prognosis Prognosis for Safe Diet Advancement: Good      Swallow Study   General HPI: Keith Young is a 68 y.o. male with PMH significant of HIV, type 2 diabetes, COPD, CVA (73532, HTN, Bells Palsy, systolic CHF, CKD, and history of AICD placement followed by cardiology.  Pt was admitted to [facility] ED 07/07/18 and after presenting with AMS at SNF residence, vomitted, and became increasingly unresponsive. Per PA note, pt was found to be encephalopathic secondary to sepsis and renal failure. Pt seen by ST in Jan 2018; BSE recc D2/thin. CXR showed cardiomegaly and small left pleural effusion. Head CT revealed new age indeterminate RIGHT basal ganglia small infarcts, old RIGHT temporal occipital/PCA territory  infarct, old basal ganglia, thalami, pontine and cerebellar small infarcts. Type of Study: Bedside Swallow Evaluation Previous Swallow Assessment: none found in chart Diet Prior to this Study: NPO Temperature Spikes Noted: No Respiratory Status: Nasal cannula History of Recent Intubation: No Behavior/Cognition: Alert;Cooperative Oral Cavity Assessment: Dry;Other (comment)(tongue coated) Oral Care Completed by SLP: Yes Oral Cavity - Dentition: Edentulous Self-Feeding Abilities: Needs assist Patient Positioning: Upright in bed Baseline Vocal Quality: Normal Volitional Cough: Strong;Congested Volitional Swallow: Able to elicit    Oral/Motor/Sensory Function Overall Oral Motor/Sensory Function: Generalized oral weakness Facial ROM: Within Functional Limits Facial Symmetry: Abnormal symmetry right Lingual ROM: Reduced left;Reduced right Lingual Symmetry: Within Functional Limits Lingual Strength: Reduced Mandible: Within Functional Limits(for tasks assessed)   Ice Chips Ice chips: Not tested   Thin Liquid Thin Liquid: Impaired Presentation: Cup;Straw Pharyngeal  Phase Impairments: Cough - Immediate    Nectar Thick Nectar Thick Liquid: Not tested   Honey Thick Honey Thick Liquid: Not tested   Puree Puree: Within functional limits Presentation: Spoon   Solid     Solid: Not tested      Jettie Booze 07/11/2018,2:37 PM

## 2018-07-11 NOTE — Progress Notes (Signed)
ANTICOAGULATION CONSULT NOTE - Follow Up Consult  Pharmacy Consult for heparin Indication: atrial fibrillation  Labs: Recent Labs    07/09/18 0437 07/10/18 0317 07/11/18 0235 07/11/18 0425  HGB 12.5* 12.4*  --  12.7*  HCT 39.8 39.3  --  40.8  PLT 73* 68*  --  56*  HEPARINUNFRC  --   --   --  <0.10*  CREATININE 5.39* 4.70* 5.08*  --     Assessment: 67yo male subtherapeutic on heparin with initial dosing for Afib; no gtt issues or signs of bleeding per RN.  Goal of Therapy:  Heparin level 0.3-0.7 units/ml   Plan:  Will increase heparin gtt by 3-4 units/kg/hr to 1100 units/hr and check level in 8 hours.    Wynona Neat, PharmD, BCPS  07/11/2018,5:57 AM

## 2018-07-11 NOTE — Progress Notes (Signed)
[facility]   [name] is stable from a cardiac standpoint.  We will see him after his echo has been performed.  Keith Robarts C. Oval Linsey, MD, [facility] 07/11/2018 11:21 AM

## 2018-07-12 ENCOUNTER — Inpatient Hospital Stay (HOSPITAL_COMMUNITY): Payer: Medicare HMO

## 2018-07-12 ENCOUNTER — Encounter (HOSPITAL_COMMUNITY)
Admission: EM | Disposition: A | Discharge status: Skilled Nursing Facility | Payer: Self-pay | Attending: Internal Medicine

## 2018-07-12 DIAGNOSIS — R9431 Abnormal electrocardiogram [ECG] [EKG]: Secondary | ICD-10-CM

## 2018-07-12 LAB — BASIC METABOLIC PANEL
Anion gap: 9 (ref 5–15)
BUN: 44 mg/dL — ABNORMAL HIGH (ref 8–23)
CO2: 25 mmol/L (ref 22–32)
Calcium: 7.3 mg/dL — ABNORMAL LOW (ref 8.9–10.3)
Chloride: 108 mmol/L (ref 98–111)
Creatinine, Ser: 4.1 mg/dL — ABNORMAL HIGH (ref 0.61–1.24)
GFR calc Af Amer: 16 mL/min — ABNORMAL LOW (ref >60)
GFR calc non Af Amer: 14 mL/min — ABNORMAL LOW (ref >60)
Glucose, Bld: 91 mg/dL (ref 70–99)
Potassium: 4.3 mmol/L (ref 3.5–5.1)
Sodium: 142 mmol/L (ref 135–145)

## 2018-07-12 LAB — CBC
HEMATOCRIT: 42.9 % (ref 39.0–52.0)
Hemoglobin: 13.5 g/dL (ref 13.0–17.0)
MCH: 29.1 pg (ref 26.0–34.0)
MCHC: 31.5 g/dL (ref 30.0–36.0)
MCV: 92.5 fL (ref 80.0–100.0)
NRBC: 0 % (ref 0.0–0.2)
Platelets: 51 10*3/uL — ABNORMAL LOW (ref 150–400)
RBC: 4.64 MIL/uL (ref 4.22–5.81)
RDW: 14.8 % (ref 11.5–15.5)
WBC: 2.4 10*3/uL — ABNORMAL LOW (ref 4.0–10.5)

## 2018-07-12 LAB — GLUCOSE, CAPILLARY
GLUCOSE-CAPILLARY: 65 mg/dL — AB (ref 70–99)
GLUCOSE-CAPILLARY: 124 mg/dL — AB (ref 70–99)
GLUCOSE-CAPILLARY: 89 mg/dL (ref 70–99)
Glucose-Capillary: 101 mg/dL — ABNORMAL HIGH (ref 70–99)
Glucose-Capillary: 112 mg/dL — ABNORMAL HIGH (ref 70–99)
Glucose-Capillary: 82 mg/dL (ref 70–99)

## 2018-07-12 LAB — MAGNESIUM: Magnesium: 1.8 mg/dL (ref 1.7–2.4)

## 2018-07-12 LAB — ECHOCARDIOGRAM COMPLETE
Height: 63 in
Weight: 2659.63 oz

## 2018-07-12 SURGERY — ECHOCARDIOGRAM, TRANSESOPHAGEAL
Anesthesia: Moderate Sedation

## 2018-07-12 MED ORDER — METHYLPREDNISOLONE SODIUM SUCC 125 MG IJ SOLR
60.0000 mg | Freq: Two times a day (BID) | INTRAMUSCULAR | Status: DC
  Administered 2018-07-12 – 2018-07-13 (×3): 60 mg via INTRAVENOUS
  Filled 2018-07-12 (×3): qty 2

## 2018-07-12 MED ORDER — SODIUM CHLORIDE 0.9 % IV SOLN
1.0000 g | INTRAVENOUS | Status: AC
  Administered 2018-07-12 – 2018-07-22 (×11): 1 g via INTRAVENOUS
  Filled 2018-07-12 (×11): qty 1

## 2018-07-12 MED ORDER — MOMETASONE FURO-FORMOTEROL FUM 100-5 MCG/ACT IN AERO
2.0000 | INHALATION_SPRAY | Freq: Two times a day (BID) | RESPIRATORY_TRACT | Status: DC
  Administered 2018-07-12 – 2018-07-26 (×21): 2 via RESPIRATORY_TRACT
  Filled 2018-07-12 (×3): qty 8.8

## 2018-07-12 MED ORDER — ALBUTEROL SULFATE (2.5 MG/3ML) 0.083% IN NEBU
2.5000 mg | INHALATION_SOLUTION | RESPIRATORY_TRACT | Status: DC | PRN
  Administered 2018-07-12: 2.5 mg via RESPIRATORY_TRACT
  Filled 2018-07-12: qty 3

## 2018-07-12 MED ORDER — DEXTROSE 50 % IV SOLN
12.5000 g | INTRAVENOUS | Status: AC
  Administered 2018-07-12: 12.5 g via INTRAVENOUS

## 2018-07-12 MED ORDER — FUROSEMIDE 10 MG/ML IJ SOLN
20.0000 mg | Freq: Once | INTRAMUSCULAR | Status: AC
  Administered 2018-07-12: 20 mg via INTRAVENOUS
  Filled 2018-07-12: qty 2

## 2018-07-12 MED ORDER — DEXTROSE 50 % IV SOLN
INTRAVENOUS | Status: AC
  Filled 2018-07-12: qty 50

## 2018-07-12 MED ORDER — SODIUM CHLORIDE 0.9 % IV SOLN
INTRAVENOUS | Status: DC | PRN
  Administered 2018-07-13 – 2018-07-14 (×2): 250 mL via INTRAVENOUS
  Administered 2018-07-20 (×2): via INTRAVENOUS

## 2018-07-12 NOTE — Progress Notes (Addendum)
Patient ID: Keith Young, male   DOB: 09-Apr-1951, 68 y.o.   MRN: [id number]  PROGRESS NOTE    Keith Young  VEH:[id number] DOB: [birth year] DOA: 07/06/2018 PCP: Virgie Dad, MD   Brief Narrative:  68 year old African-American male with history of chronic systolic CHF with EF of 83% with AICD, CKD stage IV, diabetes mellitus type 2, COPD, hypertension, CVA, HIV presented with high fevers, altered mental status with nausea and vomiting on 07/06/2018 and was diagnosed with sepsis.  ID, neurology and nephrology were consulted.  He was initially started on broad-spectrum antibiotics.  Because of acute kidney injury and altered mental status, he was also started on dialysis.  He was found to have E. coli bacteremia.  He had LP done on 07/10/2018.   Assessment & Plan:   Principal Problem:   E coli bacteremia Active Problems:   Chronic kidney disease (CKD), active medical management without dialysis, stage 3 (moderate) (HCC)   Diabetes mellitus type 2 in nonobese [facility])   Essential hypertension, benign   ICD (implantable cardioverter-defibrillator) in place   Acute kidney injury ([place])   HIV disease ([place])   Chronic combined systolic and diastolic heart failure (HCC)   Acute metabolic encephalopathy   Septic shock (HCC)   Encephalitis   ARF (acute renal failure) ([place])   Sepsis with encephalopathy and septic shock (HCC)   Tachycardia   Sepsis present on admission -Antibiotic plan as below.  ID following.  Currently hemodynamically stable  E. coli bacteremia -Questionable cause.  Continue Rocephin.  ID following.  2D echo still pending. -He was initially scheduled for TEE this morning.  Cardiology will follow up with ID regarding the need for TEE at this time. -Repeat blood culture from 07/09/2018 is negative so far.  Acute metabolic/uremic encephalopathy -Initially there was a concern for meningitis but LP done on 07/10/2018 showed only 3 WBCs.  Hence patient does not have meningitis.   Acyclovir and meningitis coverage were discontinued - Neurology following; repeat CT of the head on 07/11/2018 showed no acute intracranial abnormality but showed chronic ischemic infarcts.  Patient cannot undergo MRI of the brain.  Continue Keppra for now -Patient still slightly confused and slow to respond. -Diet as per SLP recommendations.  Acute kidney injury on chronic kidney disease stage IV -Baseline creatinine is around 2.5. -Creatinine 4.10 today.  Patient was started on dialysis on 07/09/2018.  Nephrology following.  Wheezing -Questionable cause.  Will give a trial of steroids.  Add Dulera.  HIV -Continue current antiretroviral regimen.  ID following  History of chronic systolic CHF with EF of 66%/QHUT -Currently volume being managed by dialysis which was started on 07/09/2018.  Continue metoprolol.  Cardiology following.  Strict input and output.  Daily weights  Essential hypertension -Blood pressure stable for now.  Continue metoprolol.  Monitor.  New onset A. fib with RVR -Currently rate controlled.  Patient was on heparin drip.  I discontinued the heparin drip on 07/11/2018 because of thrombocytopenia cardiology following.  Continue metoprolol.  Echo pending  Diabetes mellitus type 2 -Continue Accu-Cheks with coverage.  Hemoglobin A1c was 6.5 on 05/22/2018  Thrombocytopenia -Questionable cause.  No signs of bleeding.  Monitor   DVT prophylaxis: SCDs Code Status: DNR Family Communication: None at bedside Disposition Plan: Depends on clinical outcome  Consultants: Nephrology/neurology/ID/IR/cardiology  Procedures: IR guided HD catheter placement on 07/09/2018 LP on 07/10/2018 by IR  Antimicrobials:  Anti-infectives (From admission, onward)   Start     Dose/Rate Route Frequency Ordered  Stop   07/10/18 1430  vancomycin (VANCOCIN) IVPB 1000 mg/200 mL premix  Status:  Discontinued     1,000 mg 200 mL/hr over 60 Minutes Intravenous Every 48 hours 07/08/18 1421 07/09/18  0959   07/10/18 1000  cefTRIAXone (ROCEPHIN) 2 g in sodium chloride 0.9 % 100 mL IVPB     2 g 200 mL/hr over 30 Minutes Intravenous Every 24 hours 07/10/18 0942     07/10/18 0847  cefTRIAXone (ROCEPHIN) 2 g in sodium chloride 0.9 % 100 mL IVPB  Status:  Discontinued     2 g 200 mL/hr over 30 Minutes Intravenous Every 24 hours 07/09/18 1004 07/10/18 0933   07/09/18 1400  acyclovir (ZOVIRAX) 745 mg in dextrose 5 % 150 mL IVPB  Status:  Discontinued     10 mg/kg  74.5 kg 164.9 mL/hr over 60 Minutes Intravenous Every 24 hours 07/08/18 1421 07/09/18 0959   07/09/18 1300  bictegravir-emtricitabine-tenofovir AF (BIKTARVY) 50-200-25 MG per tablet 1 tablet     1 tablet Oral Daily 07/09/18 1012     07/08/18 2300  cefTRIAXone (ROCEPHIN) 2 g in sodium chloride 0.9 % 100 mL IVPB  Status:  Discontinued     2 g 200 mL/hr over 30 Minutes Intravenous Every 12 hours 07/08/18 1341 07/09/18 1004   07/08/18 1800  vancomycin (VANCOCIN) IVPB 1000 mg/200 mL premix  Status:  Discontinued     1,000 mg 200 mL/hr over 60 Minutes Intravenous Every 48 hours 07/06/18 2019 07/07/18 1137   07/08/18 1430  ampicillin (OMNIPEN) 2 g in sodium chloride 0.9 % 100 mL IVPB  Status:  Discontinued     2 g 300 mL/hr over 20 Minutes Intravenous Every 12 hours 07/08/18 1343 07/09/18 0959   07/08/18 1430  vancomycin (VANCOCIN) 1,500 mg in sodium chloride 0.9 % 500 mL IVPB     1,500 mg 250 mL/hr over 120 Minutes Intravenous  Once 07/08/18 1421 07/08/18 1723   07/08/18 1400  acyclovir (ZOVIRAX) 745 mg in dextrose 5 % 150 mL IVPB     10 mg/kg  74.5 kg 164.9 mL/hr over 60 Minutes Intravenous  Once 07/08/18 1345 07/09/18 1315   07/07/18 1800  ceFEPIme (MAXIPIME) 1 g in sodium chloride 0.9 % 100 mL IVPB  Status:  Discontinued     1 g 200 mL/hr over 30 Minutes Intravenous Every 24 hours 07/06/18 2019 07/07/18 1137   07/07/18 1145  cefTRIAXone (ROCEPHIN) 2 g in sodium chloride 0.9 % 100 mL IVPB  Status:  Discontinued     2 g 200 mL/hr  over 30 Minutes Intravenous Every 24 hours 07/07/18 1137 07/08/18 1341   07/07/18 1000  bictegravir-emtricitabine-tenofovir AF (BIKTARVY) 50-200-25 MG per tablet 1 tablet  Status:  Discontinued     1 tablet Oral Daily 07/07/18 0058 07/07/18 1127   07/06/18 1900  ceFEPIme (MAXIPIME) 2 g in sodium chloride 0.9 % 100 mL IVPB     2 g 200 mL/hr over 30 Minutes Intravenous  Once 07/06/18 1859 07/06/18 2001   07/06/18 1900  vancomycin (VANCOCIN) IVPB 1000 mg/200 mL premix     1,000 mg 200 mL/hr over 60 Minutes Intravenous  Once 07/06/18 1859 07/06/18 2035      Subjective: Patient seen and examined at bedside.  He is sleepy, wakes up very slightly.  Does not answer much questions.  No overnight fever or vomiting reported by nursing staff.  Objective: Vitals:   07/11/18 2023 07/12/18 0011 07/12/18 0454 07/12/18 0829  BP: (!) 146/91 125/75 110/78 Marland Kitchen)  129/96  Pulse: 85 82 82 86  Resp:    16  Temp: 97.9 F (36.6 C)  98.2 F (36.8 C) 98 F (36.7 C)  TempSrc: Oral  Oral Oral  SpO2: 100% 100% 100% 100%  Weight:      Height:        Intake/Output Summary (Last 24 hours) at 07/12/2018 0934 Last data filed at 07/12/2018 0500 Gross per 24 hour  Intake 412.13 ml  Output 2200 ml  Net -1787.87 ml   Filed Weights   07/09/18 1709 07/11/18 0901 07/11/18 1210  Weight: 78 kg 77.9 kg 75.4 kg    Examination:  General exam: Appears calm and comfortable.  No acute distress.  Sleepy, looks a very slightly, does not answer questions  respiratory system: Bilateral decreased breath sounds at bases with scattered crackles, significant wheezing Cardiovascular system: S1 & S2 heard, Rate controlled Gastrointestinal system: Abdomen is nondistended, soft and nontender. Normal bowel sounds heard. Extremities: No cyanosis; bilateral lower extremity edema   Data Reviewed: I have personally reviewed following labs and imaging studies  CBC: Recent Labs  Lab 07/06/18 1908  07/08/18 0809 07/09/18 0437  07/10/18 0317 07/11/18 0425 07/12/18 0336  WBC 1.9*   < > 13.8* 16.0* 11.8* 4.5 2.4*  NEUTROABS 1.7  --   --  14.9*  --   --   --   HGB 15.2   < > 13.1 12.5* 12.4* 12.7* 13.5  HCT 48.8   < > 41.1 39.8 39.3 40.8 42.9  MCV 91.2   < > 93.2 90.0 90.8 93.4 92.5  PLT 107*   < > 73* 73* 68* 56* 51*   < > = values in this interval not displayed.   Basic Metabolic Panel: Recent Labs  Lab 07/08/18 1626 07/09/18 0437 07/10/18 0317 07/11/18 0235 07/12/18 0336  NA 141 142 145 144 142  K 5.1 4.5 3.9 3.9 4.3  CL 113* 114* 111 107 108  CO2 17* 18* 25 26 25   GLUCOSE 119* 136* 82 114* 91  BUN 78* 83* 62* 73* 44*  CREATININE 5.12* 5.39* 4.70* 5.08* 4.10*  CALCIUM 7.4* 7.1* 7.2* 7.7* 7.3*  MG  --   --   --  1.9 1.8   GFR: Estimated Creatinine Clearance: 16.1 mL/min (A) (by C-G formula based on SCr of 4.1 mg/dL (H)). Liver Function Tests: Recent Labs  Lab 07/06/18 1908 07/07/18 0257 07/07/18 1139 07/08/18 0809 07/09/18 0437  AST 70* 320* 161* 80* 57*  ALT 46* 216* 165* 117* 79*  ALKPHOS 102 93 84 78 78  BILITOT 0.9 1.0 1.1 0.9 0.8  PROT 7.1 6.2* 6.1* 6.1* 5.9*  ALBUMIN 3.0* 2.5* 2.5* 2.3* 2.1*   No results for input(s): LIPASE, AMYLASE in the last 168 hours. Recent Labs  Lab 07/08/18 1204  AMMONIA 33   Coagulation Profile: No results for input(s): INR, PROTIME in the last 168 hours. Cardiac Enzymes: No results for input(s): CKTOTAL, CKMB, CKMBINDEX, TROPONINI in the last 168 hours. BNP (last 3 results) No results for input(s): PROBNP in the last 8760 hours. HbA1C: No results for input(s): HGBA1C in the last 72 hours. CBG: Recent Labs  Lab 07/11/18 2021 07/12/18 0013 07/12/18 0455 07/12/18 0754 07/12/18 0840  GLUCAP 91 101* 82 65* 124*   Lipid Profile: No results for input(s): CHOL, HDL, LDLCALC, TRIG, CHOLHDL, LDLDIRECT in the last 72 hours. Thyroid Function Tests: No results for input(s): TSH, T4TOTAL, FREET4, T3FREE, THYROIDAB in the last 72 hours. Anemia  Panel: No results  for input(s): VITAMINB12, FOLATE, FERRITIN, TIBC, IRON, RETICCTPCT in the last 72 hours. Sepsis Labs: Recent Labs  Lab 07/07/18 1139 07/07/18 1651 07/08/18 1626 07/08/18 1952 07/09/18 0437 07/10/18 0317  PROCALCITON  --   --  >150.00  --  >150.00 >150.00  LATICACIDVEN 3.3* 3.0* 1.6 1.5  --   --     Recent Results (from the past 240 hour(s))  Blood Culture (routine x 2)     Status: Abnormal   Collection Time: 07/06/18  7:07 PM  Result Value Ref Range Status   Specimen Description BLOOD RIGHT FOREARM  Final   Special Requests   Final    BOTTLES DRAWN AEROBIC AND ANAEROBIC Blood Culture adequate volume   Culture  Setup Time   Final    GRAM NEGATIVE RODS ANAEROBIC BOTTLE ONLY CRITICAL VALUE NOTED.  VALUE IS CONSISTENT WITH PREVIOUSLY REPORTED AND CALLED VALUE.    Culture (A)  Final    ESCHERICHIA COLI SUSCEPTIBILITIES PERFORMED ON PREVIOUS CULTURE WITHIN THE LAST 5 DAYS. Performed at [facility], [place] [address] [place], [place] [address]    Report Status 07/11/2018 FINAL  Final  Blood Culture (routine x 2)     Status: Abnormal   Collection Time: 07/06/18  7:40 PM  Result Value Ref Range Status   Specimen Description BLOOD LEFT ANTECUBITAL  Final   Special Requests   Final    BOTTLES DRAWN AEROBIC AND ANAEROBIC Blood Culture adequate volume   Culture  Setup Time   Final    GRAM NEGATIVE RODS IN BOTH AEROBIC AND ANAEROBIC BOTTLES CRITICAL RESULT CALLED TO, READ BACK BY AND VERIFIED WITHRonnald Nian [id number] FCP Performed at [facility], [place] [address] [place], [place] [address]    Culture ESCHERICHIA COLI (A)  Final   Report Status 07/09/2018 FINAL  Final   Organism ID, Bacteria ESCHERICHIA COLI  Final      Susceptibility   Escherichia coli - MIC*    AMPICILLIN >=32 RESISTANT Resistant     CEFAZOLIN 16 SENSITIVE Sensitive     CEFEPIME <=1 SENSITIVE Sensitive     CEFTAZIDIME <=1 SENSITIVE Sensitive     CEFTRIAXONE <=1  SENSITIVE Sensitive     CIPROFLOXACIN <=0.25 SENSITIVE Sensitive     GENTAMICIN <=1 SENSITIVE Sensitive     IMIPENEM <=0.25 SENSITIVE Sensitive     TRIMETH/SULFA <=20 SENSITIVE Sensitive     AMPICILLIN/SULBACTAM >=32 RESISTANT Resistant     PIP/TAZO <=4 SENSITIVE Sensitive     Extended ESBL NEGATIVE Sensitive     * ESCHERICHIA COLI  Blood Culture ID Panel (Reflexed)     Status: Abnormal   Collection Time: 07/06/18  7:40 PM  Result Value Ref Range Status   Enterococcus species NOT DETECTED NOT DETECTED Final   Listeria monocytogenes NOT DETECTED NOT DETECTED Final   Staphylococcus species NOT DETECTED NOT DETECTED Final   Staphylococcus aureus (BCID) NOT DETECTED NOT DETECTED Final   Streptococcus species NOT DETECTED NOT DETECTED Final   Streptococcus agalactiae NOT DETECTED NOT DETECTED Final   Streptococcus pneumoniae NOT DETECTED NOT DETECTED Final   Streptococcus pyogenes NOT DETECTED NOT DETECTED Final   Acinetobacter baumannii NOT DETECTED NOT DETECTED Final   Enterobacteriaceae species DETECTED (A) NOT DETECTED Final    Comment: Enterobacteriaceae represent a large family of gram-negative bacteria, not a single organism. CRITICAL RESULT CALLED TO, READ BACK BY AND VERIFIED WITH: PHARMD MANCHIL, B 041 [id number] FCP    Enterobacter cloacae complex NOT DETECTED NOT DETECTED  Final   Escherichia coli DETECTED (A) NOT DETECTED Final    Comment: CRITICAL RESULT CALLED TO, READ BACK BY AND VERIFIED WITH: PHARMD MANCHIL, B 041 [id number] FCP    Klebsiella oxytoca NOT DETECTED NOT DETECTED Final   Klebsiella pneumoniae NOT DETECTED NOT DETECTED Final   Proteus species NOT DETECTED NOT DETECTED Final   Serratia marcescens NOT DETECTED NOT DETECTED Final   Carbapenem resistance NOT DETECTED NOT DETECTED Final   Haemophilus influenzae NOT DETECTED NOT DETECTED Final   Neisseria meningitidis NOT DETECTED NOT DETECTED Final   Pseudomonas aeruginosa NOT DETECTED NOT DETECTED Final    Candida albicans NOT DETECTED NOT DETECTED Final   Candida glabrata NOT DETECTED NOT DETECTED Final   Candida krusei NOT DETECTED NOT DETECTED Final   Candida parapsilosis NOT DETECTED NOT DETECTED Final   Candida tropicalis NOT DETECTED NOT DETECTED Final    Comment: Performed at [facility], [place] [address], [place], [place] [address]  MRSA PCR Screening     Status: None   Collection Time: 07/07/18  3:24 PM  Result Value Ref Range Status   MRSA by PCR NEGATIVE NEGATIVE Final    Comment:        The GeneXpert MRSA Assay (FDA approved for NASAL specimens only), is one component of a comprehensive MRSA colonization surveillance program. It is not intended to diagnose MRSA infection nor to guide or monitor treatment for MRSA infections. Performed at [facility], [place] [address], [place], [place] [address]   Culture, Urine     Status: None   Collection Time: 07/08/18  7:47 AM  Result Value Ref Range Status   Specimen Description URINE, RANDOM  Final   Special Requests NONE  Final   Culture   Final    NO GROWTH Performed at [facility], [place] [address], [place], [place] [address]    Report Status 07/09/2018 FINAL  Final  Culture, blood (Routine X 2) w Reflex to ID Panel     Status: None (Preliminary result)   Collection Time: 07/09/18 10:18 AM  Result Value Ref Range Status   Specimen Description BLOOD RIGHT HAND  Final   Special Requests   Final    BOTTLES DRAWN AEROBIC AND ANAEROBIC Blood Culture adequate volume   Culture   Final    NO GROWTH 3 DAYS Performed at [facility], [place] [address], [place], [place] [address]    Report Status PENDING  Incomplete  Culture, blood (Routine X 2) w Reflex to ID Panel     Status: None (Preliminary result)   Collection Time: 07/09/18 10:25 AM  Result Value Ref Range Status   Specimen Description BLOOD LEFT HAND  Final   Special Requests   Final    BOTTLES DRAWN AEROBIC AND ANAEROBIC Blood Culture adequate volume     Culture   Final    NO GROWTH 3 DAYS Performed at [facility], [place] [address], [place], [place] [address]    Report Status PENDING  Incomplete  C difficile quick scan w PCR reflex     Status: Abnormal   Collection Time: 07/09/18 10:38 AM  Result Value Ref Range Status   C Diff antigen POSITIVE (A) NEGATIVE Final   C Diff toxin NEGATIVE NEGATIVE Final   C Diff interpretation Results are indeterminate. See PCR results.  Final    Comment: Performed at [facility], [place] [address], [place],  [address]  C. Diff by PCR,  Reflexed     Status: Abnormal   Collection Time: 07/09/18 10:38 AM  Result Value Ref Range Status   Toxigenic C. Difficile by PCR POSITIVE (A) NEGATIVE Final    Comment: Positive for toxigenic C. difficile with little to no toxin production. Only treat if clinical presentation suggests symptomatic illness. Performed at [facility], [place] [address] [place], [place] [address]   CSF culture     Status: None (Preliminary result)   Collection Time: 07/10/18  2:44 PM  Result Value Ref Range Status   Specimen Description CSF  Final   Special Requests NONE  Final   Gram Stain   Final    WBC PRESENT,BOTH PMN AND MONONUCLEAR NO ORGANISMS SEEN CYTOSPIN SMEAR    Culture   Final    NO GROWTH 2 DAYS Performed at [facility], [place] [address] [place], [place] [address]    Report Status PENDING  Incomplete  Fungus Culture With Stain     Status: None (Preliminary result)   Collection Time: 07/10/18  2:44 PM  Result Value Ref Range Status   Fungus Stain Final report  Final    Comment: (NOTE) Performed At: [facility] [place], [place] [id number] Rush Farmer MD KC:[id number]    Fungus (Mycology) Culture PENDING  Incomplete   Fungal Source CSF  Final  Fungus Culture Result     Status: None   Collection Time: 07/10/18  2:44 PM  Result Value Ref Range Status   Result 1 Comment  Final    Comment: (NOTE) KOH/Calcofluor  preparation:  no fungus observed. Performed At: [facility] [place], [place] [id number] Rush Farmer MD FM:[id number]          Radiology Studies: Ct Head Wo Contrast  Result Date: 07/11/2018 CLINICAL DATA:  Initial evaluation for acute altered mental status, asymmetric left-sided weakness with left facial droop. EXAM: CT HEAD WITHOUT CONTRAST TECHNIQUE: Contiguous axial images were obtained from the base of the skull through the vertex without intravenous contrast. COMPARISON:  Prior CT from 07/06/2018. FINDINGS: Brain: Age advanced cerebral atrophy with chronic microvascular ischemic disease, stable. Remote right MCA and/or MCA/PCA watershed territory infarct involving the right temporal occipital region noted, stable. Multiple chronic lacunar infarcts seen involving the bilateral basal ganglia, thalami, pons, and cerebellum. Specifically, previously noted right basal ganglia lacunar infarcts are stable from previous exam, likely chronic. No acute intracranial hemorrhage. No new or evolving acute large vessel territory infarct. No mass lesion, midline shift or mass effect. No hydrocephalus. No extra-axial fluid collection. Vascular: No hyperdense vessel. Calcified atherosclerosis at the skull base. Skull: Scalp soft tissues and calvarium within normal limits. Sinuses/Orbits: Globes and orbital soft tissues within normal limits. Scattered mucosal thickening with superimposed air-fluid levels noted within the paranasal sinuses, worsened from previous. Mastoid air cells are clear. Other: None. IMPRESSION: 1. Stable head CT, with no new acute intracranial abnormality. 2. In band cerebral atrophy with chronic small vessel ischemic disease with multiple chronic ischemic infarcts as above. 3. Acute pan sinusitis, worsened from previous. Electronically Signed   By: Jeannine Boga M.D.   On: 07/11/2018 17:20   Dg Chest Port 1 View  Result Date: 07/12/2018 CLINICAL DATA:   Wheezing EXAM: PORTABLE CHEST 1 VIEW COMPARISON:  07/11/2018 FINDINGS: Right IJ approach hemodialysis catheter tip is in the right atrium. Unchanged position of left chest wall single lead AICD. Mild interstitial pulmonary edema. Mild cardiomegaly with findings of prior CABG. Trace left pleural effusion.  IMPRESSION: Mild cardiomegaly and mild interstitial pulmonary edema. Electronically Signed   By: Ulyses Jarred M.D.   On: 07/12/2018 04:32   Dg Chest Port 1 View  Result Date: 07/11/2018 CLINICAL DATA:  Shortness of breath EXAM: PORTABLE CHEST 1 VIEW COMPARISON:  07/07/2018 FINDINGS: Left chest wall AICD lead is in unchanged position. There is mild cardiomegaly. Central pulmonary vascular congestion without overt pulmonary edema. No focal airspace consolidation. Small left pleural effusion. No pneumothorax. IMPRESSION: Cardiomegaly and small left pleural effusion without overt pulmonary edema. Electronically Signed   By: Ulyses Jarred M.D.   On: 07/11/2018 03:35   Dg Fluoro Guide Lumbar Puncture  Result Date: 07/10/2018 CLINICAL DATA:  68 year old male with encephalopathy. Subsequent encounter. EXAM: DIAGNOSTIC LUMBAR PUNCTURE UNDER FLUOROSCOPIC GUIDANCE FLUOROSCOPY TIME:  Fluoroscopy Time:  6 seconds PROCEDURE: Patient was not able to give consent. Family (sister) not available by phone. Emergency request/consent from Dr. Candiss Norse. Labs and head CT reviewed. Under fluoroscopic guidance and aseptic technique (utilizing 1% lidocaine as local anesthetic), L3-4 lumbar puncture was performed with a single pass of a 22 gauge spinal needle. Opening pressure 26 and closing pressure 20 cc water (not felt to be accurate as patient was lying on the stomach and moving throughout the exam). 8.5 cc of clear cerebral spinal fluid collected and sent for labs. Patient tolerated procedure well without any difficulty. IMPRESSION: Successful L3-4 lumbar puncture as noted above. Electronically Signed   By: Genia Del M.D.   On:  07/10/2018 15:12        Scheduled Meds: . bictegravir-emtricitabine-tenofovir AF  1 tablet Oral Daily  . chlorhexidine  15 mL Mouth Rinse BID  . Chlorhexidine Gluconate Cloth  6 each Topical Q0600  . Chlorhexidine Gluconate Cloth  6 each Topical Q0600  . insulin aspart  0-9 Units Subcutaneous Q4H  . mouth rinse  15 mL Mouth Rinse q12n4p  . metoprolol succinate  12.5 mg Oral Daily   Continuous Infusions: . cefTRIAXone (ROCEPHIN)  IV 2 g (07/12/18 0827)  . levETIRAcetam 500 mg (07/12/18 0147)     LOS: 5 days        Aline August, MD [facility] Pager [phone]  If 7PM-7AM, please contact night-coverage [email or web address] Password [facility] 07/12/2018, 9:34 AM

## 2018-07-12 NOTE — Progress Notes (Signed)
Pharmacy Antibiotic Note  Keith Young is a 68 y.o. male admitted on 07/06/2018 with E Coli bacteremia.  Pharmacy has been consulted for ceftazidime dosing. Patient has baseline CKD Stage IV with baseline SCr 2.4 and has had AKI on CKD on this admission with SCr 4.10 today. He has been receiving intermittent hemodialysis with last on 2/12. WBC WNL and patient afebrile. Patient is being switched to ceftazidime for ease of administration with dialysis if no renal recovery.   Plan: Ceftazidime 1 gm every 24 hours while dialysis schedule uncertain F/U HD plans, clinical status   Height: 5\' 3"  (160 cm) Weight: 166 lb 3.6 oz (75.4 kg) IBW/kg (Calculated) : 56.9  Temp (24hrs), Avg:98 F (36.7 C), Min:97.9 F (36.6 C), Max:98.2 F (36.8 C)  Recent Labs  Lab 07/07/18 0257 07/07/18 1139 07/07/18 1651 07/08/18 0809 07/08/18 1626 07/08/18 1952 07/09/18 0437 07/10/18 0317 07/11/18 0235 07/11/18 0425 07/12/18 0336  WBC 10.3 19.7*  --  13.8*  --   --  16.0* 11.8*  --  4.5 2.4*  CREATININE 3.72* 4.24*  --  5.32* 5.12*  --  5.39* 4.70* 5.08*  --  4.10*  LATICACIDVEN 3.0* 3.3* 3.0*  --  1.6 1.5  --   --   --   --   --     Estimated Creatinine Clearance: 16.1 mL/min (A) (by C-G formula based on SCr of 4.1 mg/dL (H)).    No Known Allergies  Antimicrobials this admission: 2/7 Cefepime X 1 2/8 Ceftriaxone>>2/13 2/13 Ceftaz>>   Microbiology results: 2/7 BCx: E. Coli 2/10 BCx: NGTD  Thank you for allowing pharmacy to be a part of this patient's care.  Jimmy Footman, PharmD, BCPS, BCIDP Infectious Diseases Clinical Pharmacist Phone: [phone] 07/12/2018 2:44 PM

## 2018-07-12 NOTE — Progress Notes (Signed)
Pt had 8 beat run of VTach, VS stable, pt asymptomatic, denied SOB and chest pain. Baltazar Najjar, NP notified.

## 2018-07-12 NOTE — Progress Notes (Signed)
[facility]    NEPHROLOGY PROGRESS NOTE  SUBJECTIVE:  No particular complaints this AM.  For TEE today.  Dialyzed yesterday.   UOP doc as 290mL yesterday.  No acute stroke on CT yesterday.    OBJECTIVE:  Vitals:   07/12/18 0454 07/12/18 0829  BP: 110/78 (!) 129/96  Pulse: 82 86  Resp:  16  Temp: 98.2 F (36.8 C) 98 F (36.7 C)  SpO2: 100% 100%    Intake/Output Summary (Last 24 hours) at 07/12/2018 1330 Last data filed at 07/12/2018 0500 Gross per 24 hour  Intake 412.13 ml  Output 200 ml  Net 212.13 ml      Genearl: comfortably laying at 30 deg, arouses to voice, localizes, answers simple questions. HEENT: MMM [place] AT anicteric sclera Neck:  No JVD, no adenopathy CV:  Heart RRR  Lungs:  L/S CTA bilaterally Abd:  abd SNT/ND with normal BS GU:  Bladder non-palpable, Foley catheter in place Extremities:  No LE edema. Skin:  No skin rash  MEDICATIONS:  . bictegravir-emtricitabine-tenofovir AF  1 tablet Oral Daily  . chlorhexidine  15 mL Mouth Rinse BID  . Chlorhexidine Gluconate Cloth  6 each Topical Q0600  . Chlorhexidine Gluconate Cloth  6 each Topical Q0600  . insulin aspart  0-9 Units Subcutaneous Q4H  . mouth rinse  15 mL Mouth Rinse q12n4p  . methylPREDNISolone (SOLU-MEDROL) injection  60 mg Intravenous Q12H  . metoprolol succinate  12.5 mg Oral Daily  . mometasone-formoterol  2 puff Inhalation BID       LABS:   CBC Latest Ref Rng & Units 07/12/2018 07/11/2018 07/10/2018  WBC 4.0 - 10.5 K/uL 2.4(L) 4.5 11.8(H)  Hemoglobin 13.0 - 17.0 g/dL 13.5 12.7(L) 12.4(L)  Hematocrit 39.0 - 52.0 % 42.9 40.8 39.3  Platelets 150 - 400 K/uL 51(L) 56(L) 68(L)    CMP Latest Ref Rng & Units 07/12/2018 07/11/2018 07/10/2018  Glucose 70 - 99 mg/dL 91 114(H) 82  BUN 8 - 23 mg/dL 44(H) 73(H) 62(H)  Creatinine 0.61 - 1.24 mg/dL 4.10(H) 5.08(H) 4.70(H)  Sodium 135 - 145 mmol/L 142 144 145  Potassium 3.5 - 5.1 mmol/L 4.3 3.9 3.9  Chloride 98 - 111 mmol/L 108 107 111   CO2 22 - 32 mmol/L 25 26 25   Calcium 8.9 - 10.3 mg/dL 7.3(L) 7.7(L) 7.2(L)  Total Protein 6.5 - 8.1 g/dL - - -  Total Bilirubin 0.3 - 1.2 mg/dL - - -  Alkaline Phos 38 - 126 U/L - - -  AST 15 - 41 U/L - - -  ALT 0 - 44 U/L - - -    Lab Results  Component Value Date   PTH 52 04/17/2018   CALCIUM 7.3 (L) 07/12/2018   CAION 1.27 (H) 11/28/2015   PHOS 3.4 04/17/2018       Component Value Date/Time   COLORURINE AMBER (A) 07/08/2018 0839   APPEARANCEUR CLOUDY (A) 07/08/2018 0839   LABSPEC 1.016 07/08/2018 0839   PHURINE 5.0 07/08/2018 0839   GLUCOSEU NEGATIVE 07/08/2018 0839   HGBUR SMALL (A) 07/08/2018 0839   BILIRUBINUR NEGATIVE 07/08/2018 0839   KETONESUR 5 (A) 07/08/2018 0839   PROTEINUR 100 (A) 07/08/2018 0839   UROBILINOGEN 0.2 10/23/2013 1545   NITRITE NEGATIVE 07/08/2018 0839   LEUKOCYTESUR LARGE (A) 07/08/2018 0839      Component Value Date/Time   PHART 7.346 (L) 07/08/2018 1235   PCO2ART 32.0 07/08/2018 1235   PO2ART 86.4 07/08/2018 1235   HCO3 17.0 (L) 07/08/2018 1235  TCO2 25 11/28/2015 1304   ACIDBASEDEF 7.5 (H) 07/08/2018 1235   O2SAT 95.9 07/08/2018 1235       Component Value Date/Time   IRON 40 (L) 04/17/2018 0734   TIBC 260 04/17/2018 0734   FERRITIN 39 04/17/2018 0734   IRONPCTSAT 15 (L) 04/17/2018 0734       ASSESSMENT/PLAN:      1.  Chronic kidney disease stage IV with a baseline serum creatinine around 2.4.  I suspect his chronic kidney disease on the basis of longstanding hypertension, diabetes, atherosclerotic cardiovascular disease, and possibly HIV.  2.  Acute kidney injury.  I suspect this is on the basis of sepsis and decreased renal perfusion.  He was dialyzed 2/10, 2/12 due to AMS.  Continue to monitor for recovery of renal function.  I think there is a reasonable chance for recovery here.  Has temp cath currently.  Day to day assessment for HD.    3.  Sepsis secondary to E coli in urine and blood.  Continue antibiotics per ID.  TEE  planned as well.    4.  Metabolic encephalopathy.  Likely secondary to sepsis.    Improving daily.  5.  Hyperkalemia.  resolved   [name] MD

## 2018-07-12 NOTE — Progress Notes (Signed)
Pt alert, oriented to self, not competent to sign consent form for TEE scheduled on 07/12/2000. Pt sister called at phone number listed in chart. Family care office answer phone and they stated that Josephine and Jeronimo Greaves, retired many years ago.  Facility where pt is resident, [facility], also called, and they provided this RN with same phone number at the first. RN on phone looked more and find other number [phone]. This RN attempted to call above stated number, no answer.

## 2018-07-12 NOTE — Progress Notes (Signed)
Chest xray completed, Baltazar Najjar, NP notified.

## 2018-07-12 NOTE — Progress Notes (Signed)
Subjective:  No complaints   Antibiotics:  Anti-infectives (From admission, onward)   Start     Dose/Rate Route Frequency Ordered Stop   07/10/18 1430  vancomycin (VANCOCIN) IVPB 1000 mg/200 mL premix  Status:  Discontinued     1,000 mg 200 mL/hr over 60 Minutes Intravenous Every 48 hours 07/08/18 1421 07/09/18 0959   07/10/18 1000  cefTRIAXone (ROCEPHIN) 2 g in sodium chloride 0.9 % 100 mL IVPB     2 g 200 mL/hr over 30 Minutes Intravenous Every 24 hours 07/10/18 0942     07/10/18 0847  cefTRIAXone (ROCEPHIN) 2 g in sodium chloride 0.9 % 100 mL IVPB  Status:  Discontinued     2 g 200 mL/hr over 30 Minutes Intravenous Every 24 hours 07/09/18 1004 07/10/18 0933   07/09/18 1400  acyclovir (ZOVIRAX) 745 mg in dextrose 5 % 150 mL IVPB  Status:  Discontinued     10 mg/kg  74.5 kg 164.9 mL/hr over 60 Minutes Intravenous Every 24 hours 07/08/18 1421 07/09/18 0959   07/09/18 1300  bictegravir-emtricitabine-tenofovir AF (BIKTARVY) 50-200-25 MG per tablet 1 tablet     1 tablet Oral Daily 07/09/18 1012     07/08/18 2300  cefTRIAXone (ROCEPHIN) 2 g in sodium chloride 0.9 % 100 mL IVPB  Status:  Discontinued     2 g 200 mL/hr over 30 Minutes Intravenous Every 12 hours 07/08/18 1341 07/09/18 1004   07/08/18 1800  vancomycin (VANCOCIN) IVPB 1000 mg/200 mL premix  Status:  Discontinued     1,000 mg 200 mL/hr over 60 Minutes Intravenous Every 48 hours 07/06/18 2019 07/07/18 1137   07/08/18 1430  ampicillin (OMNIPEN) 2 g in sodium chloride 0.9 % 100 mL IVPB  Status:  Discontinued     2 g 300 mL/hr over 20 Minutes Intravenous Every 12 hours 07/08/18 1343 07/09/18 0959   07/08/18 1430  vancomycin (VANCOCIN) 1,500 mg in sodium chloride 0.9 % 500 mL IVPB     1,500 mg 250 mL/hr over 120 Minutes Intravenous  Once 07/08/18 1421 07/08/18 1723   07/08/18 1400  acyclovir (ZOVIRAX) 745 mg in dextrose 5 % 150 mL IVPB     10 mg/kg  74.5 kg 164.9 mL/hr over 60 Minutes Intravenous  Once 07/08/18  1345 07/09/18 1315   07/07/18 1800  ceFEPIme (MAXIPIME) 1 g in sodium chloride 0.9 % 100 mL IVPB  Status:  Discontinued     1 g 200 mL/hr over 30 Minutes Intravenous Every 24 hours 07/06/18 2019 07/07/18 1137   07/07/18 1145  cefTRIAXone (ROCEPHIN) 2 g in sodium chloride 0.9 % 100 mL IVPB  Status:  Discontinued     2 g 200 mL/hr over 30 Minutes Intravenous Every 24 hours 07/07/18 1137 07/08/18 1341   07/07/18 1000  bictegravir-emtricitabine-tenofovir AF (BIKTARVY) 50-200-25 MG per tablet 1 tablet  Status:  Discontinued     1 tablet Oral Daily 07/07/18 0058 07/07/18 1127   07/06/18 1900  ceFEPIme (MAXIPIME) 2 g in sodium chloride 0.9 % 100 mL IVPB     2 g 200 mL/hr over 30 Minutes Intravenous  Once 07/06/18 1859 07/06/18 2001   07/06/18 1900  vancomycin (VANCOCIN) IVPB 1000 mg/200 mL premix     1,000 mg 200 mL/hr over 60 Minutes Intravenous  Once 07/06/18 1859 07/06/18 2035      Medications: Scheduled Meds: . bictegravir-emtricitabine-tenofovir AF  1 tablet Oral Daily  . chlorhexidine  15 mL Mouth Rinse BID  . Chlorhexidine  Gluconate Cloth  6 each Topical [id number]  . Chlorhexidine Gluconate Cloth  6 each Topical Q0600  . insulin aspart  0-9 Units Subcutaneous Q4H  . mouth rinse  15 mL Mouth Rinse q12n4p  . methylPREDNISolone (SOLU-MEDROL) injection  60 mg Intravenous Q12H  . metoprolol succinate  12.5 mg Oral Daily  . mometasone-formoterol  2 puff Inhalation BID   Continuous Infusions: . cefTRIAXone (ROCEPHIN)  IV 2 g (07/12/18 0827)  . levETIRAcetam 500 mg (07/12/18 1420)   PRN Meds:.acetaminophen **OR** acetaminophen, albuterol, LORazepam, metoprolol tartrate, ondansetron **OR** ondansetron (ZOFRAN) IV    Objective: Weight change:   Intake/Output Summary (Last 24 hours) at 07/12/2018 1429 Last data filed at 07/12/2018 0500 Gross per 24 hour  Intake 412.13 ml  Output 200 ml  Net 212.13 ml   Blood pressure (!) 129/96, pulse 86, temperature 98 F (36.7 C), temperature source  Oral, resp. rate 16, height 5\' 3"  (1.6 m), weight 75.4 kg, SpO2 100 %. Temp:  [97.9 F (36.6 C)-98.2 F (36.8 C)] 98 F (36.7 C) (02/13 0829) Pulse Rate:  [82-86] 86 (02/13 0829) Resp:  [16] 16 (02/13 0829) BP: (110-146)/(75-96) 129/96 (02/13 0829) SpO2:  [100 %] 100 % (02/13 0829)  Physical Exam: General: Alert and awake and oriented to person and being in the hospital HEENT: anicteric sclera, EOMI CVS regular rate, normal  Chest: , no wheezing, no respiratory distress Abdomen: soft non-distended,  Skin: no rashes Neuro: nonfocal  CBC:    BMET Recent Labs    07/11/18 0235 07/12/18 0336  NA 144 142  K 3.9 4.3  CL 107 108  CO2 26 25  GLUCOSE 114* 91  BUN 73* 44*  CREATININE 5.08* 4.10*  CALCIUM 7.7* 7.3*     Liver Panel  No results for input(s): PROT, ALBUMIN, AST, ALT, ALKPHOS, BILITOT, BILIDIR, IBILI in the last 72 hours.     Sedimentation Rate No results for input(s): ESRSEDRATE in the last 72 hours. C-Reactive Protein No results for input(s): CRP in the last 72 hours.  Micro Results: Recent Results (from the past 720 hour(s))  Blood Culture (routine x 2)     Status: Abnormal   Collection Time: 07/06/18  7:07 PM  Result Value Ref Range Status   Specimen Description BLOOD RIGHT FOREARM  Final   Special Requests   Final    BOTTLES DRAWN AEROBIC AND ANAEROBIC Blood Culture adequate volume   Culture  Setup Time   Final    GRAM NEGATIVE RODS ANAEROBIC BOTTLE ONLY CRITICAL VALUE NOTED.  VALUE IS CONSISTENT WITH PREVIOUSLY REPORTED AND CALLED VALUE.    Culture (A)  Final    ESCHERICHIA COLI SUSCEPTIBILITIES PERFORMED ON PREVIOUS CULTURE WITHIN THE LAST 5 DAYS. Performed at [facility], [place] [address], [place], [place] [address]    Report Status 07/11/2018 FINAL  Final  Blood Culture (routine x 2)     Status: Abnormal   Collection Time: 07/06/18  7:40 PM  Result Value Ref Range Status   Specimen Description BLOOD LEFT ANTECUBITAL  Final    Special Requests   Final    BOTTLES DRAWN AEROBIC AND ANAEROBIC Blood Culture adequate volume   Culture  Setup Time   Final    GRAM NEGATIVE RODS IN BOTH AEROBIC AND ANAEROBIC BOTTLES CRITICAL RESULT CALLED TO, READ BACK BY AND VERIFIED WITHRonnald Nian [id number] FCP Performed at [facility], [place] [address] [place], [place] [address]    Culture ESCHERICHIA COLI (A)  Final   Report Status 07/09/2018 FINAL  Final   Organism ID, Bacteria ESCHERICHIA COLI  Final      Susceptibility   Escherichia coli - MIC*    AMPICILLIN >=32 RESISTANT Resistant     CEFAZOLIN 16 SENSITIVE Sensitive     CEFEPIME <=1 SENSITIVE Sensitive     CEFTAZIDIME <=1 SENSITIVE Sensitive     CEFTRIAXONE <=1 SENSITIVE Sensitive     CIPROFLOXACIN <=0.25 SENSITIVE Sensitive     GENTAMICIN <=1 SENSITIVE Sensitive     IMIPENEM <=0.25 SENSITIVE Sensitive     TRIMETH/SULFA <=20 SENSITIVE Sensitive     AMPICILLIN/SULBACTAM >=32 RESISTANT Resistant     PIP/TAZO <=4 SENSITIVE Sensitive     Extended ESBL NEGATIVE Sensitive     * ESCHERICHIA COLI  Blood Culture ID Panel (Reflexed)     Status: Abnormal   Collection Time: 07/06/18  7:40 PM  Result Value Ref Range Status   Enterococcus species NOT DETECTED NOT DETECTED Final   Listeria monocytogenes NOT DETECTED NOT DETECTED Final   Staphylococcus species NOT DETECTED NOT DETECTED Final   Staphylococcus aureus (BCID) NOT DETECTED NOT DETECTED Final   Streptococcus species NOT DETECTED NOT DETECTED Final   Streptococcus agalactiae NOT DETECTED NOT DETECTED Final   Streptococcus pneumoniae NOT DETECTED NOT DETECTED Final   Streptococcus pyogenes NOT DETECTED NOT DETECTED Final   Acinetobacter baumannii NOT DETECTED NOT DETECTED Final   Enterobacteriaceae species DETECTED (A) NOT DETECTED Final    Comment: Enterobacteriaceae represent a large family of gram-negative bacteria, not a single organism. CRITICAL RESULT CALLED TO, READ BACK BY AND VERIFIED  WITH: PHARMD MANCHIL, B [phone] FCP    Enterobacter cloacae complex NOT DETECTED NOT DETECTED Final   Escherichia coli DETECTED (A) NOT DETECTED Final    Comment: CRITICAL RESULT CALLED TO, READ BACK BY AND VERIFIED WITH: PHARMD MANCHIL, B [phone] FCP    Klebsiella oxytoca NOT DETECTED NOT DETECTED Final   Klebsiella pneumoniae NOT DETECTED NOT DETECTED Final   Proteus species NOT DETECTED NOT DETECTED Final   Serratia marcescens NOT DETECTED NOT DETECTED Final   Carbapenem resistance NOT DETECTED NOT DETECTED Final   Haemophilus influenzae NOT DETECTED NOT DETECTED Final   Neisseria meningitidis NOT DETECTED NOT DETECTED Final   Pseudomonas aeruginosa NOT DETECTED NOT DETECTED Final   Candida albicans NOT DETECTED NOT DETECTED Final   Candida glabrata NOT DETECTED NOT DETECTED Final   Candida krusei NOT DETECTED NOT DETECTED Final   Candida parapsilosis NOT DETECTED NOT DETECTED Final   Candida tropicalis NOT DETECTED NOT DETECTED Final    Comment: Performed at [facility], [place] [address] [place], [place] [address]  MRSA PCR Screening     Status: None   Collection Time: 07/07/18  3:24 PM  Result Value Ref Range Status   MRSA by PCR NEGATIVE NEGATIVE Final    Comment:        The GeneXpert MRSA Assay (FDA approved for NASAL specimens only), is one component of a comprehensive MRSA colonization surveillance program. It is not intended to diagnose MRSA infection nor to guide or monitor treatment for MRSA infections. Performed at [facility], [place] [address] [place], [place] [address]   Culture, Urine     Status: None   Collection Time: 07/08/18  7:47 AM  Result Value Ref Range Status   Specimen Description URINE, RANDOM  Final   Special Requests NONE  Final   Culture   Final    NO GROWTH  Performed at [facility], [place] [address], [place], [place] [address]    Report Status 07/09/2018 FINAL  Final  Culture, blood (Routine X 2) w Reflex to ID  Panel     Status: None (Preliminary result)   Collection Time: 07/09/18 10:18 AM  Result Value Ref Range Status   Specimen Description BLOOD RIGHT HAND  Final   Special Requests   Final    BOTTLES DRAWN AEROBIC AND ANAEROBIC Blood Culture adequate volume   Culture   Final    NO GROWTH 3 DAYS Performed at [facility], [place] [address], [place], [place] [address]    Report Status PENDING  Incomplete  Culture, blood (Routine X 2) w Reflex to ID Panel     Status: None (Preliminary result)   Collection Time: 07/09/18 10:25 AM  Result Value Ref Range Status   Specimen Description BLOOD LEFT HAND  Final   Special Requests   Final    BOTTLES DRAWN AEROBIC AND ANAEROBIC Blood Culture adequate volume   Culture   Final    NO GROWTH 3 DAYS Performed at [facility], [place] [name] [place], [place] [address]    Report Status PENDING  Incomplete  C difficile quick scan w PCR reflex     Status: Abnormal   Collection Time: 07/09/18 10:38 AM  Result Value Ref Range Status   C Diff antigen POSITIVE (A) NEGATIVE Final   C Diff toxin NEGATIVE NEGATIVE Final   C Diff interpretation Results are indeterminate. See PCR results.  Final    Comment: Performed at [facility], [place] [address], [place], [place] [address]  C. Diff by PCR, Reflexed     Status: Abnormal   Collection Time: 07/09/18 10:38 AM  Result Value Ref Range Status   Toxigenic C. Difficile by PCR POSITIVE (A) NEGATIVE Final    Comment: Positive for toxigenic C. difficile with little to no toxin production. Only treat if clinical presentation suggests symptomatic illness. Performed at [facility], [place] [address] [place], [place] [address]   CSF culture     Status: None (Preliminary result)   Collection Time: 07/10/18  2:44 PM  Result Value Ref Range Status   Specimen Description CSF  Final   Special Requests NONE  Final   Gram Stain   Final    WBC PRESENT,BOTH PMN AND MONONUCLEAR NO ORGANISMS SEEN CYTOSPIN  SMEAR    Culture   Final    NO GROWTH 2 DAYS Performed at [facility], [place] [address], [place], [place] [address]    Report Status PENDING  Incomplete  Fungus Culture With Stain     Status: None (Preliminary result)   Collection Time: 07/10/18  2:44 PM  Result Value Ref Range Status   Fungus Stain Final report  Final    Comment: (NOTE) Performed At: [facility] [place], [place] [id number] Rush Farmer MD [id number]    Fungus (Mycology) Culture PENDING  Incomplete   Fungal Source CSF  Final  Fungus Culture Result     Status: None   Collection Time: 07/10/18  2:44 PM  Result Value Ref Range Status   Result 1 Comment  Final    Comment: (NOTE) KOH/Calcofluor preparation:  no fungus observed. Performed At: [facility] [place], [place] [id number] Rush Farmer MD [id number]     Studies/Results: Ct Head Wo Contrast  Result Date: 07/11/2018 CLINICAL DATA:  Initial evaluation for  acute altered mental status, asymmetric left-sided weakness with left facial droop. EXAM: CT HEAD WITHOUT CONTRAST TECHNIQUE: Contiguous axial images were obtained from the base of the skull through the vertex without intravenous contrast. COMPARISON:  Prior CT from 07/06/2018. FINDINGS: Brain: Age advanced cerebral atrophy with chronic microvascular ischemic disease, stable. Remote right MCA and/or MCA/PCA watershed territory infarct involving the right temporal occipital region noted, stable. Multiple chronic lacunar infarcts seen involving the bilateral basal ganglia, thalami, pons, and cerebellum. Specifically, previously noted right basal ganglia lacunar infarcts are stable from previous exam, likely chronic. No acute intracranial hemorrhage. No new or evolving acute large vessel territory infarct. No mass lesion, midline shift or mass effect. No hydrocephalus. No extra-axial fluid collection. Vascular: No hyperdense vessel. Calcified  atherosclerosis at the skull base. Skull: Scalp soft tissues and calvarium within normal limits. Sinuses/Orbits: Globes and orbital soft tissues within normal limits. Scattered mucosal thickening with superimposed air-fluid levels noted within the paranasal sinuses, worsened from previous. Mastoid air cells are clear. Other: None. IMPRESSION: 1. Stable head CT, with no new acute intracranial abnormality. 2. In band cerebral atrophy with chronic small vessel ischemic disease with multiple chronic ischemic infarcts as above. 3. Acute pan sinusitis, worsened from previous. Electronically Signed   By: Jeannine Boga M.D.   On: 07/11/2018 17:20   Dg Chest Port 1 View  Result Date: 07/12/2018 CLINICAL DATA:  Wheezing EXAM: PORTABLE CHEST 1 VIEW COMPARISON:  07/11/2018 FINDINGS: Right IJ approach hemodialysis catheter tip is in the right atrium. Unchanged position of left chest wall single lead AICD. Mild interstitial pulmonary edema. Mild cardiomegaly with findings of prior CABG. Trace left pleural effusion. IMPRESSION: Mild cardiomegaly and mild interstitial pulmonary edema. Electronically Signed   By: Ulyses Jarred M.D.   On: 07/12/2018 04:32   Dg Chest Port 1 View  Result Date: 07/11/2018 CLINICAL DATA:  Shortness of breath EXAM: PORTABLE CHEST 1 VIEW COMPARISON:  07/07/2018 FINDINGS: Left chest wall AICD lead is in unchanged position. There is mild cardiomegaly. Central pulmonary vascular congestion without overt pulmonary edema. No focal airspace consolidation. Small left pleural effusion. No pneumothorax. IMPRESSION: Cardiomegaly and small left pleural effusion without overt pulmonary edema. Electronically Signed   By: Ulyses Jarred M.D.   On: 07/11/2018 03:35   Dg Fluoro Guide Lumbar Puncture  Result Date: 07/10/2018 CLINICAL DATA:  68 year old male with encephalopathy. Subsequent encounter. EXAM: DIAGNOSTIC LUMBAR PUNCTURE UNDER FLUOROSCOPIC GUIDANCE FLUOROSCOPY TIME:  Fluoroscopy Time:  6  seconds PROCEDURE: Patient was not able to give consent. Family (sister) not available by phone. Emergency request/consent from Dr. Candiss Norse. Labs and head CT reviewed. Under fluoroscopic guidance and aseptic technique (utilizing 1% lidocaine as local anesthetic), L3-4 lumbar puncture was performed with a single pass of a 22 gauge spinal needle. Opening pressure 26 and closing pressure 20 cc water (not felt to be accurate as patient was lying on the stomach and moving throughout the exam). 8.5 cc of clear cerebral spinal fluid collected and sent for labs. Patient tolerated procedure well without any difficulty. IMPRESSION: Successful L3-4 lumbar puncture as noted above. Electronically Signed   By: Genia Del M.D.   On: 07/10/2018 15:12      Assessment/Plan:  INTERVAL HISTORY: Thoracic echocardiogram showed no vegetations on valves or leads  Principal Problem:   E coli bacteremia Active Problems:   Chronic kidney disease (CKD), active medical management without dialysis, stage 3 (moderate) (HCC)   Diabetes mellitus type 2 in nonobese [facility])   Essential hypertension, benign  ICD (implantable cardioverter-defibrillator) in place   Acute kidney injury ([place])   HIV disease ([place])   Chronic combined systolic and diastolic heart failure ([place])   Acute metabolic encephalopathy   Septic shock (HCC)   Encephalitis   ARF (acute renal failure) (HCC)   Sepsis with encephalopathy and septic shock ([place])   Tachycardia    Keith Young is a 68 y.o. male with well-controlled HIV, chronic kidney disease presented with sepsis due to E. coli bacteremia acute kidney injury and uremia, improved with IV antibiotics and dialysis.  His stool was positive for C. difficile antigen but negative for toxin positive for PCR  #1 E. coli bacteremia: Source is unclear, I will get a CT of the abdomen pelvis with oral contrast to ensure that he does not have an occult intra-abdominal infection.  His transthoracic  echocardiogram is clear.  If there is no significant intra-abdominal pathology I will plan on giving him 2 weeks of therapy with an effective cephalosporin. I will change him to Ceftazidime in case he ends up needing longer term HD #2 Encephalopathy  due to #1   #3  Stool with C. difficile present HE NEEDS TO BE ON ENTERIC PRECAUTIONS and monitored for loose stools  THOUGH HE WILL HAVE THEM TOMORROW DUE TO CONTRAST ORALLY I AM GIVING HIM SO DO NOT REACT TO BM FIRST DAY POST CT  #4 HIV:  Biktarvy   Alcide Evener 07/12/2018, 2:29 PM

## 2018-07-12 NOTE — Progress Notes (Signed)
Pt wheezing in upper lobes bilaterally, pt SpO2 99 % on 2 L via nasal cannula. Baltazar Najjar, NP paged, breathing treatment ordered.

## 2018-07-12 NOTE — Progress Notes (Addendum)
Interim note  Reviewed CT head for left side weakness, no new stroke. Unable to do MRI because of pacemaker.  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   Patient was empirically started on Keppra due to concern for seizures, however patient EEG was negative for electrographic seizures while patient had multiple twitches during exam, likely myoclonus in the setting of uremia.   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  4.  Afib, not anticaogulated due to thrombocytopenia    Recommendations PT/OT  Will d/c  Keppra

## 2018-07-12 NOTE — Progress Notes (Signed)
SLP Cancellation Note  Patient Details Name: Keith Young MRN: [id number] DOB: 09-04-1950   Cancelled treatment:       Reason Eval/Treat Not Completed: Other (SLP planned to complete MBS this morning to determine readiness for diet initiation; however, pt is now NPO for TEE.  Will plan to see for MBS as pt is able.)   Brandice Busser, Elmyra Ricks L 07/12/2018, 8:16 AM

## 2018-07-12 NOTE — Evaluation (Signed)
Physical Therapy Evaluation Patient Details Name: Keith Young MRN: [id number] DOB: [birth year] Today's Date: 07/12/2018   History of Present Illness  Patient is a 68 year old male admitted with acute mental status change and lethargy, septic shock  According to chart patient lives in facility. PMH to include: CHF, CKD, DM, HTN, HIV, COPD, OA   Clinical Impression  Patient received sleeping in bed, easily aroused, groggy, with garbled speech. Patient demonstrates left UE and LE weakness compared to right. Unable to grip on right, UE grossly 2/5,  little movement of left LE, general weakness of R side as well. Patient requiring mod/max assist for rolling in bed and for supine to sit. Patient will benefit from skilled PT to address his weakness and difficulty with mobility.       Follow Up Recommendations SNF    Equipment Recommendations  None recommended by PT    Recommendations for Other Services       Precautions / Restrictions Precautions Precautions: Fall Restrictions Weight Bearing Restrictions: No      Mobility  Bed Mobility Overal bed mobility: Needs Assistance Bed Mobility: Supine to Sit;Sit to Supine;Rolling Rolling: Mod assist(mod assist to left, unable to right due to left side weakness)   Supine to sit: Max assist Sit to supine: Max assist      Transfers                 General transfer comment: unable/unsafe at this time   Ambulation/Gait             General Gait Details: unable/unsafe at this time  Stairs            Wheelchair Mobility    Modified Rankin (Stroke Patients Only)       Balance Overall balance assessment: Needs assistance Sitting-balance support: Bilateral upper extremity supported Sitting balance-Leahy Scale: Poor                                       Pertinent Vitals/Pain Pain Assessment: No/denies pain    Home Living Family/patient expects to be discharged to:: Skilled nursing facility                       Prior Function     Gait / Transfers Assistance Needed: unable to determine if patient was ambulatory prior to admission. Per prior admission notes patient used wheelchair for most of mobility at facility.   ADL's / Homemaking Assistance Needed: assistance with dressing and bathing.         Hand Dominance   Dominant Hand: Right    Extremity/Trunk Assessment   Upper Extremity Assessment Upper Extremity Assessment: LUE deficits/detail;Generalized weakness LUE Coordination: decreased fine motor;decreased gross motor    Lower Extremity Assessment Lower Extremity Assessment: Generalized weakness;LLE deficits/detail LLE Coordination: decreased fine motor;decreased gross motor       Communication   Communication: Expressive difficulties(garbled speech)  Cognition Arousal/Alertness: Awake/alert Behavior During Therapy: WFL for tasks assessed/performed Overall Cognitive Status: No family/caregiver present to determine baseline cognitive functioning                                 General Comments: poor historian      General Comments      Exercises     Assessment/Plan    PT Assessment Patient needs continued PT  services  PT Problem List Decreased strength;Decreased balance;Decreased mobility;Decreased activity tolerance       PT Treatment Interventions Functional mobility training;Balance training;Patient/family education;Therapeutic activities;Therapeutic exercise;Neuromuscular re-education    PT Goals (Current goals can be found in the Care Plan section)  Acute Rehab PT Goals Patient Stated Goal: none stated PT Goal Formulation: Patient unable to participate in goal setting Time For Goal Achievement: 07/26/18 Potential to Achieve Goals: Fair    Frequency Min 2X/week   Barriers to discharge        Co-evaluation               AM-PAC PT "6 Clicks" Mobility  Outcome Measure Help needed turning from your back to  your side while in a flat bed without using bedrails?: Total Help needed moving from lying on your back to sitting on the side of a flat bed without using bedrails?: Total Help needed moving to and from a bed to a chair (including a wheelchair)?: Total Help needed standing up from a chair using your arms (e.g., wheelchair or bedside chair)?: Total Help needed to walk in hospital room?: Total Help needed climbing 3-5 steps with a railing? : Total 6 Click Score: 6    End of Session   Activity Tolerance: Patient limited by fatigue;Patient limited by lethargy Patient left: in bed;with bed alarm set;with call bell/phone within reach Nurse Communication: Mobility status PT Visit Diagnosis: Muscle weakness (generalized) (M62.81)    Time: 6728-9791 PT Time Calculation (min) (ACUTE ONLY): 23 min   Charges:   PT Evaluation $PT Eval Moderate Complexity: 1 Mod PT Treatments $Therapeutic Activity: 23-37 mins        [employer], PT, [employer] 07/12/18,2:23 PM

## 2018-07-12 NOTE — Progress Notes (Signed)
Unable to give oral contrast as patient was unable to tolerate.  Patient would cough between sips.  CT notified and stated would come and get patient anyway.

## 2018-07-12 NOTE — Progress Notes (Signed)
  Echocardiogram 2D Echocardiogram has been performed.  Keith Young 07/12/2018, 12:12 PM

## 2018-07-12 NOTE — Progress Notes (Addendum)
Progress Note  Patient Name: Keith Young Date of Encounter: 07/12/2018  Primary Cardiologist: No primary care provider on file.   Subjective   Denies chest pain or shortness of breath.   Inpatient Medications    Scheduled Meds: . bictegravir-emtricitabine-tenofovir AF  1 tablet Oral Daily  . chlorhexidine  15 mL Mouth Rinse BID  . Chlorhexidine Gluconate Cloth  6 each Topical Q0600  . Chlorhexidine Gluconate Cloth  6 each Topical Q0600  . insulin aspart  0-9 Units Subcutaneous Q4H  . mouth rinse  15 mL Mouth Rinse q12n4p  . methylPREDNISolone (SOLU-MEDROL) injection  60 mg Intravenous Q12H  . metoprolol succinate  12.5 mg Oral Daily  . mometasone-formoterol  2 puff Inhalation BID   Continuous Infusions: . cefTRIAXone (ROCEPHIN)  IV 2 g (07/12/18 0827)  . levETIRAcetam 500 mg (07/12/18 0147)   PRN Meds: acetaminophen **OR** acetaminophen, albuterol, LORazepam, metoprolol tartrate, ondansetron **OR** ondansetron (ZOFRAN) IV   Vital Signs    Vitals:   07/11/18 2023 07/12/18 0011 07/12/18 0454 07/12/18 0829  BP: (!) 146/91 125/75 110/78 (!) 129/96  Pulse: 85 82 82 86  Resp:    16  Temp: 97.9 F (36.6 C)  98.2 F (36.8 C) 98 F (36.7 C)  TempSrc: Oral  Oral Oral  SpO2: 100% 100% 100% 100%  Weight:      Height:        Intake/Output Summary (Last 24 hours) at 07/12/2018 1211 Last data filed at 07/12/2018 0500 Gross per 24 hour  Intake 412.13 ml  Output 200 ml  Net 212.13 ml   Last 3 Weights 07/11/2018 07/11/2018 07/09/2018  Weight (lbs) 166 lb 3.6 oz 171 lb 11.8 oz 171 lb 15.3 oz  Weight (kg) 75.4 kg 77.9 kg 78 kg      Telemetry    Sinus rhythm.  PVCS, NSVT. - Personally Reviewed  ECG    n/a - Personally Reviewed  Physical Exam   VS:  BP (!) 129/96 (BP Location: Right Arm)   Pulse 86   Temp 98 F (36.7 C) (Oral)   Resp 16   Ht '5\' 3"'  (1.6 m)   Wt 75.4 kg   SpO2 100%   BMI 29.45 kg/m  , BMI Body mass index is 29.45 kg/m. GENERAL:  Chronically  ill-appearing HEENT: Pupils equal round and reactive, fundi not visualized, oral mucosa unremarkable NECK:  No jugular venous distention, waveform within normal limits, carotid upstroke brisk and symmetric, no bruits LUNGS:  Clear to auscultation bilaterally HEART:  RRR.  PMI not displaced or sustained,S1 and S2 within normal limits, no S3, no S4, no clicks, no rubs, II/VI systolic murmur ABD:  Flat, positive bowel sounds normal in frequency in pitch, no bruits, no rebound, no guarding, no midline pulsatile mass, no hepatomegaly, no splenomegaly EXT:  2 plus pulses throughout, no edema, no cyanosis no clubbing SKIN:  No rashes no nodules NEURO:  Cranial nerves II through XII grossly intact, motor grossly intact throughout [facility]:  Cognitively intact, oriented to person place and time   Labs    Chemistry Recent Labs  Lab 07/07/18 1139 07/08/18 0809  07/09/18 0437 07/10/18 0317 07/11/18 0235 07/12/18 0336  NA 139 139   < > 142 145 144 142  K 5.5* 5.5*   < > 4.5 3.9 3.9 4.3  CL 109 110   < > 114* 111 107 108  CO2 18* 16*   < > 18* '25 26 25  ' GLUCOSE 94 108*   < >  136* 82 114* 91  BUN 54* 74*   < > 83* 62* 73* 44*  CREATININE 4.24* 5.32*   < > 5.39* 4.70* 5.08* 4.10*  CALCIUM 8.0* 7.5*   < > 7.1* 7.2* 7.7* 7.3*  PROT 6.1* 6.1*  --  5.9*  --   --   --   ALBUMIN 2.5* 2.3*  --  2.1*  --   --   --   AST 161* 80*  --  57*  --   --   --   ALT 165* 117*  --  79*  --   --   --   ALKPHOS 84 78  --  78  --   --   --   BILITOT 1.1 0.9  --  0.8  --   --   --   GFRNONAA 14* 10*   < > 10* 12* 11* 14*  GFRAA 16* 12*   < > 12* 14* 13* 16*  ANIONGAP 12 13   < > '10 9 11 9   ' < > = values in this interval not displayed.     Hematology Recent Labs  Lab 07/10/18 0317 07/11/18 0425 07/12/18 0336  WBC 11.8* 4.5 2.4*  RBC 4.33 4.37 4.64  HGB 12.4* 12.7* 13.5  HCT 39.3 40.8 42.9  MCV 90.8 93.4 92.5  MCH 28.6 29.1 29.1  MCHC 31.6 31.1 31.5  RDW 14.7 14.8 14.8  PLT 68* 56* 51*    Cardiac  EnzymesNo results for input(s): TROPONINI in the last 168 hours. No results for input(s): TROPIPOC in the last 168 hours.   BNP Recent Labs  Lab 07/06/18 1918  BNP 554.5*     DDimer No results for input(s): DDIMER in the last 168 hours.   Radiology    Ct Head Wo Contrast  Result Date: 07/11/2018 CLINICAL DATA:  Initial evaluation for acute altered mental status, asymmetric left-sided weakness with left facial droop. EXAM: CT HEAD WITHOUT CONTRAST TECHNIQUE: Contiguous axial images were obtained from the base of the skull through the vertex without intravenous contrast. COMPARISON:  Prior CT from 07/06/2018. FINDINGS: Brain: Age advanced cerebral atrophy with chronic microvascular ischemic disease, stable. Remote right MCA and/or MCA/PCA watershed territory infarct involving the right temporal occipital region noted, stable. Multiple chronic lacunar infarcts seen involving the bilateral basal ganglia, thalami, pons, and cerebellum. Specifically, previously noted right basal ganglia lacunar infarcts are stable from previous exam, likely chronic. No acute intracranial hemorrhage. No new or evolving acute large vessel territory infarct. No mass lesion, midline shift or mass effect. No hydrocephalus. No extra-axial fluid collection. Vascular: No hyperdense vessel. Calcified atherosclerosis at the skull base. Skull: Scalp soft tissues and calvarium within normal limits. Sinuses/Orbits: Globes and orbital soft tissues within normal limits. Scattered mucosal thickening with superimposed air-fluid levels noted within the paranasal sinuses, worsened from previous. Mastoid air cells are clear. Other: None. IMPRESSION: 1. Stable head CT, with no new acute intracranial abnormality. 2. In band cerebral atrophy with chronic small vessel ischemic disease with multiple chronic ischemic infarcts as above. 3. Acute pan sinusitis, worsened from previous. Electronically Signed   By: Jeannine Boga M.D.   On:  07/11/2018 17:20   Dg Chest Port 1 View  Result Date: 07/12/2018 CLINICAL DATA:  Wheezing EXAM: PORTABLE CHEST 1 VIEW COMPARISON:  07/11/2018 FINDINGS: Right IJ approach hemodialysis catheter tip is in the right atrium. Unchanged position of left chest wall single lead AICD. Mild interstitial pulmonary edema. Mild cardiomegaly with findings of prior  CABG. Trace left pleural effusion. IMPRESSION: Mild cardiomegaly and mild interstitial pulmonary edema. Electronically Signed   By: Ulyses Jarred M.D.   On: 07/12/2018 04:32   Dg Chest Port 1 View  Result Date: 07/11/2018 CLINICAL DATA:  Shortness of breath EXAM: PORTABLE CHEST 1 VIEW COMPARISON:  07/07/2018 FINDINGS: Left chest wall AICD lead is in unchanged position. There is mild cardiomegaly. Central pulmonary vascular congestion without overt pulmonary edema. No focal airspace consolidation. Small left pleural effusion. No pneumothorax. IMPRESSION: Cardiomegaly and small left pleural effusion without overt pulmonary edema. Electronically Signed   By: Ulyses Jarred M.D.   On: 07/11/2018 03:35   Dg Fluoro Guide Lumbar Puncture  Result Date: 07/10/2018 CLINICAL DATA:  68 year old male with encephalopathy. Subsequent encounter. EXAM: DIAGNOSTIC LUMBAR PUNCTURE UNDER FLUOROSCOPIC GUIDANCE FLUOROSCOPY TIME:  Fluoroscopy Time:  6 seconds PROCEDURE: Patient was not able to give consent. Family (sister) not available by phone. Emergency request/consent from Dr. Candiss Norse. Labs and head CT reviewed. Under fluoroscopic guidance and aseptic technique (utilizing 1% lidocaine as local anesthetic), L3-4 lumbar puncture was performed with a single pass of a 22 gauge spinal needle. Opening pressure 26 and closing pressure 20 cc water (not felt to be accurate as patient was lying on the stomach and moving throughout the exam). 8.5 cc of clear cerebral spinal fluid collected and sent for labs. Patient tolerated procedure well without any difficulty. IMPRESSION: Successful L3-4  lumbar puncture as noted above. Electronically Signed   By: Genia Del M.D.   On: 07/10/2018 15:12    Cardiac Studies   Echo 12/10/16: Study Conclusions  - Left ventricle: There was moderate concentric hypertrophy.   Systolic function was moderately to severely reduced. The   estimated ejection fraction was in the range of 30% to 35%.   Severe hypokinesis of the anteroseptal, inferoseptal, inferior,   and mid-apical inferolateral myocardium. Doppler parameters are   consistent with abnormal left ventricular relaxation (grade 1   diastolic dysfunction). Doppler parameters are consistent with   indeterminate ventricular filling pressure. - Aortic valve: A bioprosthesis was present. Peak velocity (S): 331   cm/s. Mean gradient (S): 30 mm Hg. Valve area (VTI): 1.09 cm^2.   Valve area (Vmax): 1.12 cm^2. Valve area (Vmean): 0.95 cm^2. - Mitral valve: Moderately calcified annulus. Transvalvular   velocity was within the normal range. There was no evidence for   stenosis. There was no regurgitation. - Left atrium: The atrium was severely dilated. - Right ventricle: The cavity size was normal. Wall thickness was   normal. Systolic function was normal. - Right atrium: The atrium was moderately dilated. - Tricuspid valve: There was mild regurgitation. - Pulmonary arteries: Systolic pressure was within the normal   range. PA peak pressure: 27 mm Hg (S). - Impressions: Bioprosthetic aortic valve gradients are moderately   elevated and mildly increased from 05/2016 (26 mmHg to 30 mmHg).   Valve leaflets appear to have normal excursion. Given the peak   velocity >38ms, mean gradient 30 mmHg, dimentionless valve index   of 0.43 and acceleration time of 137 ms, cannot rule out mild   prosthetic valve stensosis.  Impressions:  - Bioprosthetic aortic valve gradients are moderately elevated and   mildly increased from 05/2016 (26 mmHg to 30 mmHg). Valve leaflets   appear to have normal  excursion. Given the peak velocity >348m,   mean gradient 30 mmHg, dimentionless valve index of 0.43 and   acceleration time of 137 ms, cannot rule out mild prosthetic  valve stensosis.  Patient Profile     Mr. 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 Coli bacteremia and encephalopathy.  Cardiology was consulted for atrial fibrillation/flutter with RVR.    Assessment & Plan    # Atrial fibrillation with RVR: # NSVT: Remains in sinus rhythm.  Now back in sinus rhythm with PVCs and NSVT.  He is not on anticoagulation 2/2 thrombocytopenia.  Given his CHA2DS2-Vasc score 5, would start this when able.  Ultimately he will need anticoagulation with Eliquis vs. Warfarin.  Will defer to nephrology on their preference.  Increase metoprolol to 39m.    # Chronic systolic an diastolic heart failure: # S/p ICD: Echo reviewed briefly as it was being performed.  LVEF seems slightly lower.  He isn't a candidate for ACE-I/ARB/ARNI given his renal function.  Home digoxin on hold in the setting of worsening renal function.  Increase metoprolol as above.  No evidence of vegetation on ICD.  Volume management with HD.  He isn't a candidate for cath given lack of symptoms, multiple commodities and thrombocytopenia.  Therefore no repeat ischemia evaluation for slightly worsened LVEF.  # E. Coli bacteremia: No evidence of bioprosthetic valve or device infection on TTE.  Given that it is not a pathogen that commonly causes endocarditis, no plans for TEE at this time unless cultures are repeatedly positive.  # Bioprosthetic AVR: Mean gradient moderately elevated to 30 mmHg in 2018.  Repeat echo shows a slightly lower gradient.  Low suspicion for endocarditis.   # Encephalopathy: Improving with HD.  Micro from LP is unremarkable.          For questions or updates, please contact CHeckerPlease consult [email or web address] for contact info under        Signed, TSkeet Latch MD  07/12/2018, 12:11 PM

## 2018-07-13 ENCOUNTER — Inpatient Hospital Stay (HOSPITAL_COMMUNITY): Payer: Medicare HMO

## 2018-07-13 DIAGNOSIS — K5790 Diverticulosis of intestine, part unspecified, without perforation or abscess without bleeding: Secondary | ICD-10-CM

## 2018-07-13 DIAGNOSIS — G9340 Encephalopathy, unspecified: Secondary | ICD-10-CM

## 2018-07-13 LAB — CBC WITH DIFFERENTIAL/PLATELET
Abs Immature Granulocytes: 0.05 10*3/uL (ref 0.00–0.07)
BASOS ABS: 0 10*3/uL (ref 0.0–0.1)
Basophils Relative: 0 %
EOS ABS: 0 10*3/uL (ref 0.0–0.5)
Eosinophils Relative: 0 %
HCT: 41.9 % (ref 39.0–52.0)
Hemoglobin: 13.2 g/dL (ref 13.0–17.0)
Immature Granulocytes: 2 %
Lymphocytes Relative: 24 %
Lymphs Abs: 0.8 10*3/uL (ref 0.7–4.0)
MCH: 28.9 pg (ref 26.0–34.0)
MCHC: 31.5 g/dL (ref 30.0–36.0)
MCV: 91.7 fL (ref 80.0–100.0)
Monocytes Absolute: 0.2 10*3/uL (ref 0.1–1.0)
Monocytes Relative: 6 %
NRBC: 0 % (ref 0.0–0.2)
Neutro Abs: 2.3 10*3/uL (ref 1.7–7.7)
Neutrophils Relative %: 68 %
Platelets: 71 10*3/uL — ABNORMAL LOW (ref 150–400)
RBC: 4.57 MIL/uL (ref 4.22–5.81)
RDW: 14.6 % (ref 11.5–15.5)
WBC Morphology: ABNORMAL
WBC: 3.3 10*3/uL — AB (ref 4.0–10.5)

## 2018-07-13 LAB — BASIC METABOLIC PANEL
Anion gap: 10 (ref 5–15)
BUN: 72 mg/dL — ABNORMAL HIGH (ref 8–23)
CO2: 24 mmol/L (ref 22–32)
Calcium: 7.4 mg/dL — ABNORMAL LOW (ref 8.9–10.3)
Chloride: 109 mmol/L (ref 98–111)
Creatinine, Ser: 5.74 mg/dL — ABNORMAL HIGH (ref 0.61–1.24)
GFR calc non Af Amer: 9 mL/min — ABNORMAL LOW (ref >60)
GFR, EST AFRICAN AMERICAN: 11 mL/min — AB (ref >60)
Glucose, Bld: 161 mg/dL — ABNORMAL HIGH (ref 70–99)
Potassium: 4.7 mmol/L (ref 3.5–5.1)
Sodium: 143 mmol/L (ref 135–145)

## 2018-07-13 LAB — GLUCOSE, CAPILLARY
Glucose-Capillary: 131 mg/dL — ABNORMAL HIGH (ref 70–99)
Glucose-Capillary: 132 mg/dL — ABNORMAL HIGH (ref 70–99)
Glucose-Capillary: 147 mg/dL — ABNORMAL HIGH (ref 70–99)
Glucose-Capillary: 153 mg/dL — ABNORMAL HIGH (ref 70–99)
Glucose-Capillary: 157 mg/dL — ABNORMAL HIGH (ref 70–99)

## 2018-07-13 LAB — CSF CULTURE W GRAM STAIN: Culture: NO GROWTH

## 2018-07-13 LAB — SEROTONIN RELEASE ASSAY (SRA)
SRA .2 IU/mL UFH Ser-aCnc: 1 % (ref 0–20)
SRA 100IU/mL UFH Ser-aCnc: 1 % (ref 0–20)

## 2018-07-13 LAB — MAGNESIUM: Magnesium: 2.1 mg/dL (ref 1.7–2.4)

## 2018-07-13 MED ORDER — CHLORHEXIDINE GLUCONATE CLOTH 2 % EX PADS
6.0000 | MEDICATED_PAD | Freq: Every day | CUTANEOUS | Status: DC
  Administered 2018-07-14 – 2018-07-16 (×3): 6 via TOPICAL

## 2018-07-13 MED ORDER — METHYLPREDNISOLONE SODIUM SUCC 40 MG IJ SOLR
40.0000 mg | Freq: Two times a day (BID) | INTRAMUSCULAR | Status: DC
  Administered 2018-07-13 – 2018-07-14 (×2): 40 mg via INTRAVENOUS
  Filled 2018-07-13 (×2): qty 1

## 2018-07-13 MED ORDER — HEPARIN SODIUM (PORCINE) 1000 UNIT/ML IJ SOLN
INTRAMUSCULAR | Status: AC
  Filled 2018-07-13: qty 3

## 2018-07-13 MED ORDER — HEPARIN SODIUM (PORCINE) 1000 UNIT/ML IJ SOLN
2.8000 mL | Freq: Once | INTRAMUSCULAR | Status: AC
  Administered 2018-07-13: 2800 [IU] via INTRAVENOUS

## 2018-07-13 MED ORDER — METOPROLOL SUCCINATE ER 25 MG PO TB24
25.0000 mg | ORAL_TABLET | Freq: Every day | ORAL | Status: DC
  Administered 2018-07-14 – 2018-07-25 (×11): 25 mg via ORAL
  Filled 2018-07-13 (×12): qty 1

## 2018-07-13 NOTE — Progress Notes (Addendum)
Patient ID: Keith Young, male   DOB: [birth year], 68 y.o.   MRN: [id number]  PROGRESS NOTE    Keith Young  XVQ:[id number] DOB: [birth year] DOA: 07/06/2018 PCP: Virgie Dad, MD   Brief Narrative:  20 year old African-American male with history of chronic systolic CHF with EF of 09% with AICD, CKD stage IV, diabetes mellitus type 2, COPD, hypertension, CVA, HIV presented with high fevers, altered mental status with nausea and vomiting on 07/06/2018 and was diagnosed with sepsis.  ID, neurology and nephrology were consulted.  He was initially started on broad-spectrum antibiotics.  Because of acute kidney injury and altered mental status, he was also started on dialysis.  He was found to have E. coli bacteremia.  He had LP done on 07/10/2018.   Assessment & Plan:   Principal Problem:   E coli bacteremia Active Problems:   Chronic kidney disease (CKD), active medical management without dialysis, stage 3 (moderate) (HCC)   Diabetes mellitus type 2 in nonobese [facility])   Essential hypertension, benign   ICD (implantable cardioverter-defibrillator) in place   Acute kidney injury ([place])   HIV disease ([place])   Chronic combined systolic and diastolic heart failure (HCC)   Acute metabolic encephalopathy   Septic shock (HCC)   Encephalitis   ARF (acute renal failure) ([place])   Sepsis with encephalopathy and septic shock (HCC)   Tachycardia   Sepsis present on admission -Antibiotic plan as below.  ID following.  Currently hemodynamically stable  E. coli bacteremia -Questionable cause.  ID following.  Rocephin was switched to cefazolin by ID.  2D echo showed no vegetations.   -Repeat blood culture from 07/09/2018 is negative so far.  CT abdomen pelvis on 07/12/2018 shows diverticulosis without diverticulitis.  Acute metabolic/uremic encephalopathy -Initially there was a concern for meningitis but LP done on 07/10/2018 showed only 3 WBCs.  Hence patient does not have meningitis.  Acyclovir and  meningitis coverage were discontinued - Neurology has signed off; repeat CT of the head on 07/11/2018 showed no acute intracranial abnormality but showed chronic ischemic infarcts.  Patient cannot undergo MRI of the brain.  Keppra has been discontinued -Patient still slightly confused and slow to respond. -Diet as per SLP recommendations.  Acute kidney injury on chronic kidney disease stage IV -Baseline creatinine is around 2.5. -Creatinine 5.74 today.  Patient was started on dialysis on 07/09/2018.  Nephrology following.  Wheezing -Questionable cause.  Decrease Solu-Medrol to 40 mg IV every 12 hours.  Continue Dulera.  HIV -Continue current antiretroviral regimen.  ID following  History of chronic systolic CHF with EF of 32%/IZTI -Currently volume being managed by dialysis which was started on 07/09/2018.  Continue metoprolol.  Cardiology following.  Strict input and output.  Daily weights  Essential hypertension -Blood pressure stable for now.  Continue metoprolol.  Monitor.  New onset A. fib with RVR -Currently rate controlled.  Patient was on heparin drip.  I discontinued the heparin drip on 07/11/2018 because of thrombocytopenia cardiology following.  Continue metoprolol.    Diabetes mellitus type 2 -Continue Accu-Cheks with coverage.  Hemoglobin A1c was 6.5 on 05/22/2018  Thrombocytopenia -Questionable cause.  Improving.  No signs of bleeding.  Monitor   DVT prophylaxis: SCDs Code Status: DNR Family Communication: None at bedside Disposition Plan: Depends on clinical outcome  Consultants: Nephrology/neurology/ID/IR/cardiology  Procedures: IR guided HD catheter placement on 07/09/2018 LP on 07/10/2018 by IR  Antimicrobials:  Anti-infectives (From admission, onward)   Start     Dose/Rate Route Frequency  Ordered Stop   07/12/18 2200  cefTAZidime (FORTAZ) 1 g in sodium chloride 0.9 % 100 mL IVPB     1 g 200 mL/hr over 30 Minutes Intravenous Every 24 hours 07/12/18 1449      07/10/18 1430  vancomycin (VANCOCIN) IVPB 1000 mg/200 mL premix  Status:  Discontinued     1,000 mg 200 mL/hr over 60 Minutes Intravenous Every 48 hours 07/08/18 1421 07/09/18 0959   07/10/18 1000  cefTRIAXone (ROCEPHIN) 2 g in sodium chloride 0.9 % 100 mL IVPB  Status:  Discontinued     2 g 200 mL/hr over 30 Minutes Intravenous Every 24 hours 07/10/18 0942 07/12/18 1433   07/10/18 0847  cefTRIAXone (ROCEPHIN) 2 g in sodium chloride 0.9 % 100 mL IVPB  Status:  Discontinued     2 g 200 mL/hr over 30 Minutes Intravenous Every 24 hours 07/09/18 1004 07/10/18 0933   07/09/18 1400  acyclovir (ZOVIRAX) 745 mg in dextrose 5 % 150 mL IVPB  Status:  Discontinued     10 mg/kg  74.5 kg 164.9 mL/hr over 60 Minutes Intravenous Every 24 hours 07/08/18 1421 07/09/18 0959   07/09/18 1300  bictegravir-emtricitabine-tenofovir AF (BIKTARVY) 50-200-25 MG per tablet 1 tablet     1 tablet Oral Daily 07/09/18 1012     07/08/18 2300  cefTRIAXone (ROCEPHIN) 2 g in sodium chloride 0.9 % 100 mL IVPB  Status:  Discontinued     2 g 200 mL/hr over 30 Minutes Intravenous Every 12 hours 07/08/18 1341 07/09/18 1004   07/08/18 1800  vancomycin (VANCOCIN) IVPB 1000 mg/200 mL premix  Status:  Discontinued     1,000 mg 200 mL/hr over 60 Minutes Intravenous Every 48 hours 07/06/18 2019 07/07/18 1137   07/08/18 1430  ampicillin (OMNIPEN) 2 g in sodium chloride 0.9 % 100 mL IVPB  Status:  Discontinued     2 g 300 mL/hr over 20 Minutes Intravenous Every 12 hours 07/08/18 1343 07/09/18 0959   07/08/18 1430  vancomycin (VANCOCIN) 1,500 mg in sodium chloride 0.9 % 500 mL IVPB     1,500 mg 250 mL/hr over 120 Minutes Intravenous  Once 07/08/18 1421 07/08/18 1723   07/08/18 1400  acyclovir (ZOVIRAX) 745 mg in dextrose 5 % 150 mL IVPB     10 mg/kg  74.5 kg 164.9 mL/hr over 60 Minutes Intravenous  Once 07/08/18 1345 07/09/18 1315   07/07/18 1800  ceFEPIme (MAXIPIME) 1 g in sodium chloride 0.9 % 100 mL IVPB  Status:  Discontinued      1 g 200 mL/hr over 30 Minutes Intravenous Every 24 hours 07/06/18 2019 07/07/18 1137   07/07/18 1145  cefTRIAXone (ROCEPHIN) 2 g in sodium chloride 0.9 % 100 mL IVPB  Status:  Discontinued     2 g 200 mL/hr over 30 Minutes Intravenous Every 24 hours 07/07/18 1137 07/08/18 1341   07/07/18 1000  bictegravir-emtricitabine-tenofovir AF (BIKTARVY) 50-200-25 MG per tablet 1 tablet  Status:  Discontinued     1 tablet Oral Daily 07/07/18 0058 07/07/18 1127   07/06/18 1900  ceFEPIme (MAXIPIME) 2 g in sodium chloride 0.9 % 100 mL IVPB     2 g 200 mL/hr over 30 Minutes Intravenous  Once 07/06/18 1859 07/06/18 2001   07/06/18 1900  vancomycin (VANCOCIN) IVPB 1000 mg/200 mL premix     1,000 mg 200 mL/hr over 60 Minutes Intravenous  Once 07/06/18 1859 07/06/18 2035       Subjective: Patient seen and examined at bedside.  He  is sleepy, wakes up very slightly but does not answer questions.  No overnight fever, vomiting or agitation reported by nursing staff  Objective: Vitals:   07/12/18 2049 07/12/18 2331 07/13/18 0347 07/13/18 0844  BP:  (!) 141/81 (!) 160/96   Pulse:  85 77   Resp:  15 16   Temp:  98 F (36.7 C) (!) 97.5 F (36.4 C)   TempSrc:  Oral Oral   SpO2: 100% 100% 100% 100%  Weight:      Height:        Intake/Output Summary (Last 24 hours) at 07/13/2018 0911 Last data filed at 07/13/2018 0655 Gross per 24 hour  Intake 515 ml  Output 500 ml  Net 15 ml   Filed Weights   07/09/18 1709 07/11/18 0901 07/11/18 1210  Weight: 78 kg 77.9 kg 75.4 kg    Examination:  General exam: Appears calm and comfortable.  No distress.  Sleepy, wakes up only very slightly, does not answer questions  respiratory system: Bilateral decreased breath sounds at bases with scattered crackles, slight wheezing Cardiovascular system: Rate controlled, S1 & S2 heard Gastrointestinal system: Abdomen is nondistended, soft and nontender. Normal bowel sounds heard. Extremities: No cyanosis; bilateral lower  extremity edema   Data Reviewed: I have personally reviewed following labs and imaging studies  CBC: Recent Labs  Lab 07/06/18 1908  07/09/18 0437 07/10/18 0317 07/11/18 0425 07/12/18 0336 07/13/18 0337  WBC 1.9*   < > 16.0* 11.8* 4.5 2.4* 3.3*  NEUTROABS 1.7  --  14.9*  --   --   --  2.3  HGB 15.2   < > 12.5* 12.4* 12.7* 13.5 13.2  HCT 48.8   < > 39.8 39.3 40.8 42.9 41.9  MCV 91.2   < > 90.0 90.8 93.4 92.5 91.7  PLT 107*   < > 73* 68* 56* 51* 71*   < > = values in this interval not displayed.   Basic Metabolic Panel: Recent Labs  Lab 07/09/18 0437 07/10/18 0317 07/11/18 0235 07/12/18 0336 07/13/18 0337  NA 142 145 144 142 143  K 4.5 3.9 3.9 4.3 4.7  CL 114* 111 107 108 109  CO2 18* 25 26 25 24   GLUCOSE 136* 82 114* 91 161*  BUN 83* 62* 73* 44* 72*  CREATININE 5.39* 4.70* 5.08* 4.10* 5.74*  CALCIUM 7.1* 7.2* 7.7* 7.3* 7.4*  MG  --   --  1.9 1.8 2.1   GFR: Estimated Creatinine Clearance: 11.5 mL/min (A) (by C-G formula based on SCr of 5.74 mg/dL (H)). Liver Function Tests: Recent Labs  Lab 07/06/18 1908 07/07/18 0257 07/07/18 1139 07/08/18 0809 07/09/18 0437  AST 70* 320* 161* 80* 57*  ALT 46* 216* 165* 117* 79*  ALKPHOS 102 93 84 78 78  BILITOT 0.9 1.0 1.1 0.9 0.8  PROT 7.1 6.2* 6.1* 6.1* 5.9*  ALBUMIN 3.0* 2.5* 2.5* 2.3* 2.1*   No results for input(s): LIPASE, AMYLASE in the last 168 hours. Recent Labs  Lab 07/08/18 1204  AMMONIA 33   Coagulation Profile: No results for input(s): INR, PROTIME in the last 168 hours. Cardiac Enzymes: No results for input(s): CKTOTAL, CKMB, CKMBINDEX, TROPONINI in the last 168 hours. BNP (last 3 results) No results for input(s): PROBNP in the last 8760 hours. HbA1C: No results for input(s): HGBA1C in the last 72 hours. CBG: Recent Labs  Lab 07/12/18 1220 07/12/18 1724 07/13/18 0017 07/13/18 0456 07/13/18 0844  GLUCAP 89 112* 131* 157* 132*   Lipid Profile: No  results for input(s): CHOL, HDL, LDLCALC,  TRIG, CHOLHDL, LDLDIRECT in the last 72 hours. Thyroid Function Tests: No results for input(s): TSH, T4TOTAL, FREET4, T3FREE, THYROIDAB in the last 72 hours. Anemia Panel: No results for input(s): VITAMINB12, FOLATE, FERRITIN, TIBC, IRON, RETICCTPCT in the last 72 hours. Sepsis Labs: Recent Labs  Lab 07/07/18 1139 07/07/18 1651 07/08/18 1626 07/08/18 1952 07/09/18 0437 07/10/18 0317  PROCALCITON  --   --  >150.00  --  >150.00 >150.00  LATICACIDVEN 3.3* 3.0* 1.6 1.5  --   --     Recent Results (from the past 240 hour(s))  Blood Culture (routine x 2)     Status: Abnormal   Collection Time: 07/06/18  7:07 PM  Result Value Ref Range Status   Specimen Description BLOOD RIGHT FOREARM  Final   Special Requests   Final    BOTTLES DRAWN AEROBIC AND ANAEROBIC Blood Culture adequate volume   Culture  Setup Time   Final    GRAM NEGATIVE RODS ANAEROBIC BOTTLE ONLY CRITICAL VALUE NOTED.  VALUE IS CONSISTENT WITH PREVIOUSLY REPORTED AND CALLED VALUE.    Culture (A)  Final    ESCHERICHIA COLI SUSCEPTIBILITIES PERFORMED ON PREVIOUS CULTURE WITHIN THE LAST 5 DAYS. Performed at [facility], [place] [address] [place], [place] [address]    Report Status 07/11/2018 FINAL  Final  Blood Culture (routine x 2)     Status: Abnormal   Collection Time: 07/06/18  7:40 PM  Result Value Ref Range Status   Specimen Description BLOOD LEFT ANTECUBITAL  Final   Special Requests   Final    BOTTLES DRAWN AEROBIC AND ANAEROBIC Blood Culture adequate volume   Culture  Setup Time   Final    GRAM NEGATIVE RODS IN BOTH AEROBIC AND ANAEROBIC BOTTLES CRITICAL RESULT CALLED TO, READ BACK BY AND VERIFIED WITHRonnald Nian [id number] FCP Performed at [facility], [place] [address] [place], [place] [address]    Culture ESCHERICHIA COLI (A)  Final   Report Status 07/09/2018 FINAL  Final   Organism ID, Bacteria ESCHERICHIA COLI  Final      Susceptibility   Escherichia coli - MIC*    AMPICILLIN  >=32 RESISTANT Resistant     CEFAZOLIN 16 SENSITIVE Sensitive     CEFEPIME <=1 SENSITIVE Sensitive     CEFTAZIDIME <=1 SENSITIVE Sensitive     CEFTRIAXONE <=1 SENSITIVE Sensitive     CIPROFLOXACIN <=0.25 SENSITIVE Sensitive     GENTAMICIN <=1 SENSITIVE Sensitive     IMIPENEM <=0.25 SENSITIVE Sensitive     TRIMETH/SULFA <=20 SENSITIVE Sensitive     AMPICILLIN/SULBACTAM >=32 RESISTANT Resistant     PIP/TAZO <=4 SENSITIVE Sensitive     Extended ESBL NEGATIVE Sensitive     * ESCHERICHIA COLI  Blood Culture ID Panel (Reflexed)     Status: Abnormal   Collection Time: 07/06/18  7:40 PM  Result Value Ref Range Status   Enterococcus species NOT DETECTED NOT DETECTED Final   Listeria monocytogenes NOT DETECTED NOT DETECTED Final   Staphylococcus species NOT DETECTED NOT DETECTED Final   Staphylococcus aureus (BCID) NOT DETECTED NOT DETECTED Final   Streptococcus species NOT DETECTED NOT DETECTED Final   Streptococcus agalactiae NOT DETECTED NOT DETECTED Final   Streptococcus pneumoniae NOT DETECTED NOT DETECTED Final   Streptococcus pyogenes NOT DETECTED NOT DETECTED Final   Acinetobacter baumannii NOT DETECTED NOT DETECTED Final   Enterobacteriaceae species DETECTED (A) NOT DETECTED Final    Comment: Enterobacteriaceae represent  a large family of gram-negative bacteria, not a single organism. CRITICAL RESULT CALLED TO, READ BACK BY AND VERIFIED WITH: PHARMD MANCHIL, B [phone] FCP    Enterobacter cloacae complex NOT DETECTED NOT DETECTED Final   Escherichia coli DETECTED (A) NOT DETECTED Final    Comment: CRITICAL RESULT CALLED TO, READ BACK BY AND VERIFIED WITH: PHARMD MANCHIL, B [phone] FCP    Klebsiella oxytoca NOT DETECTED NOT DETECTED Final   Klebsiella pneumoniae NOT DETECTED NOT DETECTED Final   Proteus species NOT DETECTED NOT DETECTED Final   Serratia marcescens NOT DETECTED NOT DETECTED Final   Carbapenem resistance NOT DETECTED NOT DETECTED Final   Haemophilus  influenzae NOT DETECTED NOT DETECTED Final   Neisseria meningitidis NOT DETECTED NOT DETECTED Final   Pseudomonas aeruginosa NOT DETECTED NOT DETECTED Final   Candida albicans NOT DETECTED NOT DETECTED Final   Candida glabrata NOT DETECTED NOT DETECTED Final   Candida krusei NOT DETECTED NOT DETECTED Final   Candida parapsilosis NOT DETECTED NOT DETECTED Final   Candida tropicalis NOT DETECTED NOT DETECTED Final    Comment: Performed at [facility], [place] [address], [place], [place] [address]  MRSA PCR Screening     Status: None   Collection Time: 07/07/18  3:24 PM  Result Value Ref Range Status   MRSA by PCR NEGATIVE NEGATIVE Final    Comment:        The GeneXpert MRSA Assay (FDA approved for NASAL specimens only), is one component of a comprehensive MRSA colonization surveillance program. It is not intended to diagnose MRSA infection nor to guide or monitor treatment for MRSA infections. Performed at [facility], [place] [address], [place], [place] [address]   Culture, Urine     Status: None   Collection Time: 07/08/18  7:47 AM  Result Value Ref Range Status   Specimen Description URINE, RANDOM  Final   Special Requests NONE  Final   Culture   Final    NO GROWTH Performed at [facility], [place] [address], [place], [place] [address]    Report Status 07/09/2018 FINAL  Final  Culture, blood (Routine X 2) w Reflex to ID Panel     Status: None (Preliminary result)   Collection Time: 07/09/18 10:18 AM  Result Value Ref Range Status   Specimen Description BLOOD RIGHT HAND  Final   Special Requests   Final    BOTTLES DRAWN AEROBIC AND ANAEROBIC Blood Culture adequate volume   Culture   Final    NO GROWTH 4 DAYS Performed at [facility], [place] [address], [place], [place] [address]    Report Status PENDING  Incomplete  Culture, blood (Routine X 2) w Reflex to ID Panel     Status: None (Preliminary result)   Collection Time: 07/09/18 10:25 AM  Result Value  Ref Range Status   Specimen Description BLOOD LEFT HAND  Final   Special Requests   Final    BOTTLES DRAWN AEROBIC AND ANAEROBIC Blood Culture adequate volume   Culture   Final    NO GROWTH 4 DAYS Performed at [facility], [place] [address], [place], [place] [address]    Report Status PENDING  Incomplete  C difficile quick scan w PCR reflex     Status: Abnormal   Collection Time: 07/09/18 10:38 AM  Result Value Ref Range Status   C Diff antigen POSITIVE (A) NEGATIVE Final   C Diff toxin NEGATIVE NEGATIVE Final  C Diff interpretation Results are indeterminate. See PCR results.  Final    Comment: Performed at [facility], [place] [address], [place], [place] [address]  C. Diff by PCR, Reflexed     Status: Abnormal   Collection Time: 07/09/18 10:38 AM  Result Value Ref Range Status   Toxigenic C. Difficile by PCR POSITIVE (A) NEGATIVE Final    Comment: Positive for toxigenic C. difficile with little to no toxin production. Only treat if clinical presentation suggests symptomatic illness. Performed at [facility], [place] [address], [place], [place] [address]   CSF culture     Status: None (Preliminary result)   Collection Time: 07/10/18  2:44 PM  Result Value Ref Range Status   Specimen Description CSF  Final   Special Requests NONE  Final   Gram Stain   Final    WBC PRESENT,BOTH PMN AND MONONUCLEAR NO ORGANISMS SEEN CYTOSPIN SMEAR    Culture   Final    NO GROWTH 3 DAYS Performed at [facility], [place] [address], [place], [place] [address]    Report Status PENDING  Incomplete  Fungus Culture With Stain     Status: None (Preliminary result)   Collection Time: 07/10/18  2:44 PM  Result Value Ref Range Status   Fungus Stain Final report  Final    Comment: (NOTE) Performed At: [facility] [place], [place] [id number] Rush Farmer MD [id number]    Fungus (Mycology) Culture PENDING  Incomplete   Fungal Source CSF  Final  Fungus  Culture Result     Status: None   Collection Time: 07/10/18  2:44 PM  Result Value Ref Range Status   Result 1 Comment  Final    Comment: (NOTE) KOH/Calcofluor preparation:  no fungus observed. Performed At: [facility] [place], [place] [id number] Rush Farmer MD [id number]          Radiology Studies: Ct Abdomen Pelvis Wo Contrast  Result Date: 07/12/2018 CLINICAL DATA:  Bacteremia EXAM: CT ABDOMEN AND PELVIS WITHOUT CONTRAST TECHNIQUE: Multidetector CT imaging of the abdomen and pelvis was performed following the standard protocol without IV contrast. COMPARISON:  None. FINDINGS: Lower chest: Lung bases demonstrate mild atelectatic changes without sizable effusion. Coronary calcifications are seen. Defibrillator is again noted. Hepatobiliary: No focal liver abnormality is seen. No gallstones, gallbladder wall thickening, or biliary dilatation. Pancreas: Unremarkable. No pancreatic ductal dilatation or surrounding inflammatory changes. Spleen: Normal in size without focal abnormality. Adrenals/Urinary Tract: Adrenal glands are within normal limits. Kidneys are well visualized bilaterally. Right renal cyst is seen. A smaller left renal cyst is noted as well no renal calculi or obstructive changes are seen. The ureters are within normal limits bilaterally. The bladder is partially distended. Stomach/Bowel: Diverticular changes noted within the colon without evidence of diverticulitis. The appendix is within normal limits. No small bowel inflammatory changes are seen. Stomach is decompressed. Vascular/Lymphatic: Aortic atherosclerosis. No enlarged abdominal or pelvic lymph nodes. Reproductive: Prostate is unremarkable. Other: No abdominal wall hernia or abnormality. No abdominopelvic ascites. Musculoskeletal: Degenerative changes of the lumbar spine are seen. IMPRESSION: Diverticulosis without diverticulitis. Bilateral renal cysts. Mild bibasilar atelectasis.  Electronically Signed   By: Inez Catalina M.D.   On: 07/12/2018 19:09   Ct Head Wo Contrast  Result Date: 07/11/2018 CLINICAL DATA:  Initial evaluation for acute altered mental status, asymmetric left-sided weakness with left facial droop. EXAM: CT HEAD WITHOUT CONTRAST TECHNIQUE: Contiguous axial images were obtained from the  base of the skull through the vertex without intravenous contrast. COMPARISON:  Prior CT from 07/06/2018. FINDINGS: Brain: Age advanced cerebral atrophy with chronic microvascular ischemic disease, stable. Remote right MCA and/or MCA/PCA watershed territory infarct involving the right temporal occipital region noted, stable. Multiple chronic lacunar infarcts seen involving the bilateral basal ganglia, thalami, pons, and cerebellum. Specifically, previously noted right basal ganglia lacunar infarcts are stable from previous exam, likely chronic. No acute intracranial hemorrhage. No new or evolving acute large vessel territory infarct. No mass lesion, midline shift or mass effect. No hydrocephalus. No extra-axial fluid collection. Vascular: No hyperdense vessel. Calcified atherosclerosis at the skull base. Skull: Scalp soft tissues and calvarium within normal limits. Sinuses/Orbits: Globes and orbital soft tissues within normal limits. Scattered mucosal thickening with superimposed air-fluid levels noted within the paranasal sinuses, worsened from previous. Mastoid air cells are clear. Other: None. IMPRESSION: 1. Stable head CT, with no new acute intracranial abnormality. 2. In band cerebral atrophy with chronic small vessel ischemic disease with multiple chronic ischemic infarcts as above. 3. Acute pan sinusitis, worsened from previous. Electronically Signed   By: Jeannine Boga M.D.   On: 07/11/2018 17:20   Dg Chest Port 1 View  Result Date: 07/12/2018 CLINICAL DATA:  Wheezing EXAM: PORTABLE CHEST 1 VIEW COMPARISON:  07/11/2018 FINDINGS: Right IJ approach hemodialysis catheter tip  is in the right atrium. Unchanged position of left chest wall single lead AICD. Mild interstitial pulmonary edema. Mild cardiomegaly with findings of prior CABG. Trace left pleural effusion. IMPRESSION: Mild cardiomegaly and mild interstitial pulmonary edema. Electronically Signed   By: Ulyses Jarred M.D.   On: 07/12/2018 04:32        Scheduled Meds: . bictegravir-emtricitabine-tenofovir AF  1 tablet Oral Daily  . chlorhexidine  15 mL Mouth Rinse BID  . Chlorhexidine Gluconate Cloth  6 each Topical Q0600  . Chlorhexidine Gluconate Cloth  6 each Topical Q0600  . Chlorhexidine Gluconate Cloth  6 each Topical Q0600  . insulin aspart  0-9 Units Subcutaneous Q4H  . mouth rinse  15 mL Mouth Rinse q12n4p  . methylPREDNISolone (SOLU-MEDROL) injection  60 mg Intravenous Q12H  . metoprolol succinate  12.5 mg Oral Daily  . mometasone-formoterol  2 puff Inhalation BID   Continuous Infusions: . sodium chloride 250 mL (07/13/18 0103)  . cefTAZidime (FORTAZ)  IV Stopped (07/12/18 2137)     LOS: 6 days        Aline August, MD [facility] Pager [phone]  If 7PM-7AM, please contact night-coverage [email or web address] Password [facility] 07/13/2018, 9:11 AM

## 2018-07-13 NOTE — Progress Notes (Signed)
PHARMACY CONSULT NOTE FOR:  OUTPATIENT  PARENTERAL ANTIBIOTIC THERAPY (OPAT)  Indication: E. coli bacteremia Regimen: Fortaz 1g IV Q 24 hrs End date:  07/22/2018 (for total of 2 weeks counting from 2/10, 1st neg blood cx)   IV antibiotic discharge orders are pended. To discharging provider:  please sign these orders via discharge navigator,  Select New Orders & click on the button choice - Manage This Unsigned Work.     Thank you for allowing pharmacy to be a part of this patient's care.  Maryanna Shape, PharmD, BCPS, BCPPS Clinical Pharmacist  Pager: [phone]   07/13/2018, 8:03 PM

## 2018-07-13 NOTE — Progress Notes (Signed)
SLP Cancellation Note  Patient Details Name: Keith Young MRN: [id number] DOB: June 28, 1950   Cancelled treatment:       Reason Eval/Treat Not Completed: Patient at procedure or test/unavailable. Attempt x3 at [facility]. Pt now at HD. Will attempt tomorrow. Instrumental testing remains necessary as ongoing coughing documented with PO intake.    Keith Young, Keith Young 07/13/2018, 12:51 PM

## 2018-07-13 NOTE — Plan of Care (Signed)
  Problem: Education: Goal: Knowledge of General Education information will improve Description Including pain rating scale, medication(s)/side effects and non-pharmacologic comfort measures Outcome: Progressing   Problem: Health Behavior/Discharge Planning: Goal: Ability to manage health-related needs will improve Outcome: Progressing   Problem: Clinical Measurements: Goal: Ability to maintain clinical measurements within normal limits will improve Outcome: Progressing Goal: Will remain free from infection Outcome: Progressing Goal: Diagnostic test results will improve Outcome: Progressing Goal: Respiratory complications will improve Outcome: Progressing Goal: Cardiovascular complication will be avoided Outcome: Progressing   Problem: Activity: Goal: Risk for activity intolerance will decrease Outcome: Progressing   Problem: Nutrition: Goal: Adequate nutrition will be maintained Outcome: Progressing   Problem: Coping: Goal: Level of anxiety will decrease Outcome: Progressing   Problem: Elimination: Goal: Will not experience complications related to bowel motility Outcome: Progressing Goal: Will not experience complications related to urinary retention Outcome: Progressing   Problem: Pain Managment: Goal: General experience of comfort will improve Outcome: Progressing   Problem: Safety: Goal: Ability to remain free from injury will improve Outcome: Progressing   Problem: Skin Integrity: Goal: Risk for impaired skin integrity will decrease Outcome: Progressing   Problem: Fluid Volume: Goal: Hemodynamic stability will improve Outcome: Progressing   Problem: Respiratory: Goal: Ability to maintain adequate ventilation will improve Outcome: Progressing   Problem: Education: Goal: Ability to demonstrate management of disease process will improve Outcome: Progressing Goal: Ability to verbalize understanding of medication therapies will improve Outcome:  Progressing Goal: Individualized Educational Video(s) Outcome: Progressing   Problem: Activity: Goal: Capacity to carry out activities will improve Outcome: Progressing   Problem: Cardiac: Goal: Ability to achieve and maintain adequate cardiopulmonary perfusion will improve Outcome: Progressing

## 2018-07-13 NOTE — Progress Notes (Signed)
[facility]    NEPHROLOGY PROGRESS NOTE  SUBJECTIVE:  No particular complaints this AM, oriented only to self.  TEE yesterday without vegetation.  Plan CT A/P oral contrast only today.  Dialyzed 2/12.   UOP doc as 51mL yesterday.    OBJECTIVE:  Vitals:   07/12/18 2331 07/13/18 0347  BP: (!) 141/81 (!) 160/96  Pulse: 85 77  Resp: 15 16  Temp: 98 F (36.7 C) (!) 97.5 F (36.4 C)  SpO2: 100% 100%    Intake/Output Summary (Last 24 hours) at 07/13/2018 0710 Last data filed at 07/13/2018 0655 Gross per 24 hour  Intake 515 ml  Output 500 ml  Net 15 ml      Genearl: comfortably laying at 30 deg, arouses to voice, localizes, answers simple questions. HEENT: MMM [name] AT anicteric sclera Neck:  RIJ temp cath c/d/i CV:  Heart RRR  Lungs:  L/S CTA bilaterally Abd:  abd SNT/ND with normal BS GU:  Bladder non-palpable, Foley catheter in place Extremities:  No LE edema. Skin:  No skin rash Neuro: oriented to self only, "I dont know' to other questions.   MEDICATIONS:  . bictegravir-emtricitabine-tenofovir AF  1 tablet Oral Daily  . chlorhexidine  15 mL Mouth Rinse BID  . Chlorhexidine Gluconate Cloth  6 each Topical Q0600  . Chlorhexidine Gluconate Cloth  6 each Topical Q0600  . Chlorhexidine Gluconate Cloth  6 each Topical Q0600  . insulin aspart  0-9 Units Subcutaneous Q4H  . mouth rinse  15 mL Mouth Rinse q12n4p  . methylPREDNISolone (SOLU-MEDROL) injection  60 mg Intravenous Q12H  . metoprolol succinate  12.5 mg Oral Daily  . mometasone-formoterol  2 puff Inhalation BID       LABS:   CBC Latest Ref Rng & Units 07/13/2018 07/12/2018 07/11/2018  WBC 4.0 - 10.5 K/uL 3.3(L) 2.4(L) 4.5  Hemoglobin 13.0 - 17.0 g/dL 13.2 13.5 12.7(L)  Hematocrit 39.0 - 52.0 % 41.9 42.9 40.8  Platelets 150 - 400 K/uL 71(L) 51(L) 56(L)    CMP Latest Ref Rng & Units 07/13/2018 07/12/2018 07/11/2018  Glucose 70 - 99 mg/dL 161(H) 91 114(H)  BUN 8 - 23 mg/dL 72(H) 44(H) 73(H)   Creatinine 0.61 - 1.24 mg/dL 5.74(H) 4.10(H) 5.08(H)  Sodium 135 - 145 mmol/L 143 142 144  Potassium 3.5 - 5.1 mmol/L 4.7 4.3 3.9  Chloride 98 - 111 mmol/L 109 108 107  CO2 22 - 32 mmol/L 24 25 26   Calcium 8.9 - 10.3 mg/dL 7.4(L) 7.3(L) 7.7(L)  Total Protein 6.5 - 8.1 g/dL - - -  Total Bilirubin 0.3 - 1.2 mg/dL - - -  Alkaline Phos 38 - 126 U/L - - -  AST 15 - 41 U/L - - -  ALT 0 - 44 U/L - - -    Lab Results  Component Value Date   PTH 52 04/17/2018   CALCIUM 7.4 (L) 07/13/2018   CAION 1.27 (H) 11/28/2015   PHOS 3.4 04/17/2018       Component Value Date/Time   COLORURINE AMBER (A) 07/08/2018 0839   APPEARANCEUR CLOUDY (A) 07/08/2018 0839   LABSPEC 1.016 07/08/2018 0839   PHURINE 5.0 07/08/2018 0839   GLUCOSEU NEGATIVE 07/08/2018 0839   HGBUR SMALL (A) 07/08/2018 0839   BILIRUBINUR NEGATIVE 07/08/2018 0839   KETONESUR 5 (A) 07/08/2018 0839   PROTEINUR 100 (A) 07/08/2018 0839   UROBILINOGEN 0.2 10/23/2013 1545   NITRITE NEGATIVE 07/08/2018 0839   LEUKOCYTESUR LARGE (A) 07/08/2018 1610  Component Value Date/Time   PHART 7.346 (L) 07/08/2018 1235   PCO2ART 32.0 07/08/2018 1235   PO2ART 86.4 07/08/2018 1235   HCO3 17.0 (L) 07/08/2018 1235   TCO2 25 11/28/2015 1304   ACIDBASEDEF 7.5 (H) 07/08/2018 1235   O2SAT 95.9 07/08/2018 1235       Component Value Date/Time   IRON 40 (L) 04/17/2018 0734   TIBC 260 04/17/2018 0734   FERRITIN 39 04/17/2018 0734   IRONPCTSAT 15 (L) 04/17/2018 0734       ASSESSMENT/PLAN:      1.  Chronic kidney disease stage IV with a baseline serum creatinine around 2.4-2.8.  I suspect his chronic kidney disease on the basis of longstanding hypertension, diabetes, atherosclerotic cardiovascular disease, and possibly HIV.  2.  Acute kidney injury.  I suspect this is on the basis of sepsis and decreased renal perfusion.  Dialysis was initiated on 2/10 for uremia causing/contributing to AMS.  At this point he has been requiring regularly.  Continue to monitor for recovery of renal function.  I think there is a reasonable chance for recovery here.  Has temp cath currently.  Day to day assessment for HD.  We will plan for HD today.  3.  Sepsis secondary to E coli in urine and blood.  Continue antibiotics per ID.  TEE Ok.  CT A/P (no IV contrast) today.   4.  Metabolic encephalopathy.  Likely secondary to sepsis.    Improving daily.  5.  Hyperkalemia.  resolved  6.  Bioprosthetic AVR, Chronic dual HF with EF , A fib with RVR:  Cardiology following.  Anticoagulation on hold in light of thrombocytopenia, rec start when able (deferred coumadin v eliquis to nephrology; I recommend coumadin when able over eliquis)   [name] MD

## 2018-07-13 NOTE — Progress Notes (Addendum)
Subjective:  No complaints   Antibiotics:  Anti-infectives (From admission, onward)   Start     Dose/Rate Route Frequency Ordered Stop   07/12/18 2200  cefTAZidime (FORTAZ) 1 g in sodium chloride 0.9 % 100 mL IVPB     1 g 200 mL/hr over 30 Minutes Intravenous Every 24 hours 07/12/18 1449     07/10/18 1430  vancomycin (VANCOCIN) IVPB 1000 mg/200 mL premix  Status:  Discontinued     1,000 mg 200 mL/hr over 60 Minutes Intravenous Every 48 hours 07/08/18 1421 07/09/18 0959   07/10/18 1000  cefTRIAXone (ROCEPHIN) 2 g in sodium chloride 0.9 % 100 mL IVPB  Status:  Discontinued     2 g 200 mL/hr over 30 Minutes Intravenous Every 24 hours 07/10/18 0942 07/12/18 1433   07/10/18 0847  cefTRIAXone (ROCEPHIN) 2 g in sodium chloride 0.9 % 100 mL IVPB  Status:  Discontinued     2 g 200 mL/hr over 30 Minutes Intravenous Every 24 hours 07/09/18 1004 07/10/18 0933   07/09/18 1400  acyclovir (ZOVIRAX) 745 mg in dextrose 5 % 150 mL IVPB  Status:  Discontinued     10 mg/kg  74.5 kg 164.9 mL/hr over 60 Minutes Intravenous Every 24 hours 07/08/18 1421 07/09/18 0959   07/09/18 1300  bictegravir-emtricitabine-tenofovir AF (BIKTARVY) 50-200-25 MG per tablet 1 tablet     1 tablet Oral Daily 07/09/18 1012     07/08/18 2300  cefTRIAXone (ROCEPHIN) 2 g in sodium chloride 0.9 % 100 mL IVPB  Status:  Discontinued     2 g 200 mL/hr over 30 Minutes Intravenous Every 12 hours 07/08/18 1341 07/09/18 1004   07/08/18 1800  vancomycin (VANCOCIN) IVPB 1000 mg/200 mL premix  Status:  Discontinued     1,000 mg 200 mL/hr over 60 Minutes Intravenous Every 48 hours 07/06/18 2019 07/07/18 1137   07/08/18 1430  ampicillin (OMNIPEN) 2 g in sodium chloride 0.9 % 100 mL IVPB  Status:  Discontinued     2 g 300 mL/hr over 20 Minutes Intravenous Every 12 hours 07/08/18 1343 07/09/18 0959   07/08/18 1430  vancomycin (VANCOCIN) 1,500 mg in sodium chloride 0.9 % 500 mL IVPB     1,500 mg 250 mL/hr over 120 Minutes  Intravenous  Once 07/08/18 1421 07/08/18 1723   07/08/18 1400  acyclovir (ZOVIRAX) 745 mg in dextrose 5 % 150 mL IVPB     10 mg/kg  74.5 kg 164.9 mL/hr over 60 Minutes Intravenous  Once 07/08/18 1345 07/09/18 1315   07/07/18 1800  ceFEPIme (MAXIPIME) 1 g in sodium chloride 0.9 % 100 mL IVPB  Status:  Discontinued     1 g 200 mL/hr over 30 Minutes Intravenous Every 24 hours 07/06/18 2019 07/07/18 1137   07/07/18 1145  cefTRIAXone (ROCEPHIN) 2 g in sodium chloride 0.9 % 100 mL IVPB  Status:  Discontinued     2 g 200 mL/hr over 30 Minutes Intravenous Every 24 hours 07/07/18 1137 07/08/18 1341   07/07/18 1000  bictegravir-emtricitabine-tenofovir AF (BIKTARVY) 50-200-25 MG per tablet 1 tablet  Status:  Discontinued     1 tablet Oral Daily 07/07/18 0058 07/07/18 1127   07/06/18 1900  ceFEPIme (MAXIPIME) 2 g in sodium chloride 0.9 % 100 mL IVPB     2 g 200 mL/hr over 30 Minutes Intravenous  Once 07/06/18 1859 07/06/18 2001   07/06/18 1900  vancomycin (VANCOCIN) IVPB 1000 mg/200 mL premix     1,000 mg  200 mL/hr over 60 Minutes Intravenous  Once 07/06/18 1859 07/06/18 2035      Medications: Scheduled Meds: . bictegravir-emtricitabine-tenofovir AF  1 tablet Oral Daily  . chlorhexidine  15 mL Mouth Rinse BID  . Chlorhexidine Gluconate Cloth  6 each Topical Q0600  . Chlorhexidine Gluconate Cloth  6 each Topical Q0600  . Chlorhexidine Gluconate Cloth  6 each Topical Q0600  . insulin aspart  0-9 Units Subcutaneous Q4H  . mouth rinse  15 mL Mouth Rinse q12n4p  . methylPREDNISolone (SOLU-MEDROL) injection  40 mg Intravenous Q12H  . [START ON 07/14/2018] metoprolol succinate  25 mg Oral Daily  . mometasone-formoterol  2 puff Inhalation BID   Continuous Infusions: . sodium chloride 250 mL (07/13/18 0103)  . cefTAZidime (FORTAZ)  IV Stopped (07/12/18 2137)   PRN Meds:.sodium chloride, acetaminophen **OR** acetaminophen, albuterol, LORazepam, metoprolol tartrate, ondansetron **OR** ondansetron  (ZOFRAN) IV    Objective: Weight change:   Intake/Output Summary (Last 24 hours) at 07/13/2018 1727 Last data filed at 07/13/2018 1542 Gross per 24 hour  Intake 395 ml  Output 1400 ml  Net -1005 ml   Blood pressure 120/75, pulse 71, temperature (!) 97.5 F (36.4 C), temperature source Oral, resp. rate 17, height 5\' 3"  (1.6 m), weight 74.2 kg, SpO2 100 %. Temp:  [97.5 F (36.4 C)-98 F (36.7 C)] 97.5 F (36.4 C) (02/14 1542) Pulse Rate:  [71-85] 71 (02/14 1542) Resp:  [15-18] 17 (02/14 1542) BP: (110-160)/(63-96) 120/75 (02/14 1542) SpO2:  [100 %] 100 % (02/14 1542) Weight:  [74.2 kg-75.2 kg] 74.2 kg (02/14 1542)  Physical Exam: General: Alert and awake and Starting HD Neuro: nonfocal  CBC:    BMET Recent Labs    07/12/18 0336 07/13/18 0337  NA 142 143  K 4.3 4.7  CL 108 109  CO2 25 24  GLUCOSE 91 161*  BUN 44* 72*  CREATININE 4.10* 5.74*  CALCIUM 7.3* 7.4*     Liver Panel  No results for input(s): PROT, ALBUMIN, AST, ALT, ALKPHOS, BILITOT, BILIDIR, IBILI in the last 72 hours.     Sedimentation Rate No results for input(s): ESRSEDRATE in the last 72 hours. C-Reactive Protein No results for input(s): CRP in the last 72 hours.  Micro Results: Recent Results (from the past 720 hour(s))  Blood Culture (routine x 2)     Status: Abnormal   Collection Time: 07/06/18  7:07 PM  Result Value Ref Range Status   Specimen Description BLOOD RIGHT FOREARM  Final   Special Requests   Final    BOTTLES DRAWN AEROBIC AND ANAEROBIC Blood Culture adequate volume   Culture  Setup Time   Final    GRAM NEGATIVE RODS ANAEROBIC BOTTLE ONLY CRITICAL VALUE NOTED.  VALUE IS CONSISTENT WITH PREVIOUSLY REPORTED AND CALLED VALUE.    Culture (A)  Final    ESCHERICHIA COLI SUSCEPTIBILITIES PERFORMED ON PREVIOUS CULTURE WITHIN THE LAST 5 DAYS. Performed at [facility], [place] [address] [place], [place] [address]    Report Status 07/11/2018 FINAL  Final  Blood Culture  (routine x 2)     Status: Abnormal   Collection Time: 07/06/18  7:40 PM  Result Value Ref Range Status   Specimen Description BLOOD LEFT ANTECUBITAL  Final   Special Requests   Final    BOTTLES DRAWN AEROBIC AND ANAEROBIC Blood Culture adequate volume   Culture  Setup Time   Final    GRAM NEGATIVE RODS IN BOTH AEROBIC AND ANAEROBIC BOTTLES CRITICAL RESULT  CALLED TO, READ BACK BY AND VERIFIED WITHMaryclare Bean [id number] FCP Performed at [facility], [place] [address] [place], [place] [address]    Culture ESCHERICHIA COLI (A)  Final   Report Status 07/09/2018 FINAL  Final   Organism ID, Bacteria ESCHERICHIA COLI  Final      Susceptibility   Escherichia coli - MIC*    AMPICILLIN >=32 RESISTANT Resistant     CEFAZOLIN 16 SENSITIVE Sensitive     CEFEPIME <=1 SENSITIVE Sensitive     CEFTAZIDIME <=1 SENSITIVE Sensitive     CEFTRIAXONE <=1 SENSITIVE Sensitive     CIPROFLOXACIN <=0.25 SENSITIVE Sensitive     GENTAMICIN <=1 SENSITIVE Sensitive     IMIPENEM <=0.25 SENSITIVE Sensitive     TRIMETH/SULFA <=20 SENSITIVE Sensitive     AMPICILLIN/SULBACTAM >=32 RESISTANT Resistant     PIP/TAZO <=4 SENSITIVE Sensitive     Extended ESBL NEGATIVE Sensitive     * ESCHERICHIA COLI  Blood Culture ID Panel (Reflexed)     Status: Abnormal   Collection Time: 07/06/18  7:40 PM  Result Value Ref Range Status   Enterococcus species NOT DETECTED NOT DETECTED Final   Listeria monocytogenes NOT DETECTED NOT DETECTED Final   Staphylococcus species NOT DETECTED NOT DETECTED Final   Staphylococcus aureus (BCID) NOT DETECTED NOT DETECTED Final   Streptococcus species NOT DETECTED NOT DETECTED Final   Streptococcus agalactiae NOT DETECTED NOT DETECTED Final   Streptococcus pneumoniae NOT DETECTED NOT DETECTED Final   Streptococcus pyogenes NOT DETECTED NOT DETECTED Final   Acinetobacter baumannii NOT DETECTED NOT DETECTED Final   Enterobacteriaceae species DETECTED (A) NOT DETECTED Final     Comment: Enterobacteriaceae represent a large family of gram-negative bacteria, not a single organism. CRITICAL RESULT CALLED TO, READ BACK BY AND VERIFIED WITH: PHARMD MANCHIL, B 041 [id number] FCP    Enterobacter cloacae complex NOT DETECTED NOT DETECTED Final   Escherichia coli DETECTED (A) NOT DETECTED Final    Comment: CRITICAL RESULT CALLED TO, READ BACK BY AND VERIFIED WITH: PHARMD MANCHIL, B 041 [id number] FCP    Klebsiella oxytoca NOT DETECTED NOT DETECTED Final   Klebsiella pneumoniae NOT DETECTED NOT DETECTED Final   Proteus species NOT DETECTED NOT DETECTED Final   Serratia marcescens NOT DETECTED NOT DETECTED Final   Carbapenem resistance NOT DETECTED NOT DETECTED Final   Haemophilus influenzae NOT DETECTED NOT DETECTED Final   Neisseria meningitidis NOT DETECTED NOT DETECTED Final   Pseudomonas aeruginosa NOT DETECTED NOT DETECTED Final   Candida albicans NOT DETECTED NOT DETECTED Final   Candida glabrata NOT DETECTED NOT DETECTED Final   Candida krusei NOT DETECTED NOT DETECTED Final   Candida parapsilosis NOT DETECTED NOT DETECTED Final   Candida tropicalis NOT DETECTED NOT DETECTED Final    Comment: Performed at [facility], [place] [address], [place], [place] [address]  MRSA PCR Screening     Status: None   Collection Time: 07/07/18  3:24 PM  Result Value Ref Range Status   MRSA by PCR NEGATIVE NEGATIVE Final    Comment:        The GeneXpert MRSA Assay (FDA approved for NASAL specimens only), is one component of a comprehensive MRSA colonization surveillance program. It is not intended to diagnose MRSA infection nor to guide or monitor treatment for MRSA infections. Performed at [facility], [place] [address] [place], [place] [address]   Culture, Urine     Status: None   Collection Time: 07/08/18  7:47 AM  Result Value Ref Range Status   Specimen Description URINE, RANDOM  Final   Special Requests NONE  Final   Culture   Final    NO  GROWTH Performed at [facility], [address], [place], [place] [address]    Report Status 07/09/2018 FINAL  Final  Culture, blood (Routine X 2) w Reflex to ID Panel     Status: None (Preliminary result)   Collection Time: 07/09/18 10:18 AM  Result Value Ref Range Status   Specimen Description BLOOD RIGHT HAND  Final   Special Requests   Final    BOTTLES DRAWN AEROBIC AND ANAEROBIC Blood Culture adequate volume   Culture   Final    NO GROWTH 4 DAYS Performed at [facility], [place] [address], [place], [place] [address]    Report Status PENDING  Incomplete  Culture, blood (Routine X 2) w Reflex to ID Panel     Status: None (Preliminary result)   Collection Time: 07/09/18 10:25 AM  Result Value Ref Range Status   Specimen Description BLOOD LEFT HAND  Final   Special Requests   Final    BOTTLES DRAWN AEROBIC AND ANAEROBIC Blood Culture adequate volume   Culture   Final    NO GROWTH 4 DAYS Performed at [facility], [place] [address] [place], [place] [address]    Report Status PENDING  Incomplete  C difficile quick scan w PCR reflex     Status: Abnormal   Collection Time: 07/09/18 10:38 AM  Result Value Ref Range Status   C Diff antigen POSITIVE (A) NEGATIVE Final   C Diff toxin NEGATIVE NEGATIVE Final   C Diff interpretation Results are indeterminate. See PCR results.  Final    Comment: Performed at [facility], [place] [address], [place], [place] [address]  C. Diff by PCR, Reflexed     Status: Abnormal   Collection Time: 07/09/18 10:38 AM  Result Value Ref Range Status   Toxigenic C. Difficile by PCR POSITIVE (A) NEGATIVE Final    Comment: Positive for toxigenic C. difficile with little to no toxin production. Only treat if clinical presentation suggests symptomatic illness. Performed at [facility], [place] [address] [place], [place] [address]   CSF culture     Status: None   Collection Time: 07/10/18  2:44 PM  Result Value Ref Range Status    Specimen Description CSF  Final   Special Requests NONE  Final   Gram Stain   Final    WBC PRESENT,BOTH PMN AND MONONUCLEAR NO ORGANISMS SEEN CYTOSPIN SMEAR    Culture   Final    NO GROWTH 3 DAYS Performed at [facility], [place] [address], [place], [place] [address]    Report Status 07/13/2018 FINAL  Final  Fungus Culture With Stain     Status: None (Preliminary result)   Collection Time: 07/10/18  2:44 PM  Result Value Ref Range Status   Fungus Stain Final report  Final    Comment: (NOTE) Performed At: [facility] [place], [place] [id number] Rush Farmer MD VZ:[id number]    Fungus (Mycology) Culture PENDING  Incomplete   Fungal Source CSF  Final  Fungus Culture Result     Status: None   Collection Time: 07/10/18  2:44 PM  Result Value Ref Range Status   Result 1 Comment  Final    Comment: (NOTE) KOH/Calcofluor preparation:  no fungus observed. Performed  At: [facility] [place], [place] [id number] Rush Farmer MD QI:[id number]     Studies/Results: Ct Abdomen Pelvis Wo Contrast  Result Date: 07/12/2018 CLINICAL DATA:  Bacteremia EXAM: CT ABDOMEN AND PELVIS WITHOUT CONTRAST TECHNIQUE: Multidetector CT imaging of the abdomen and pelvis was performed following the standard protocol without IV contrast. COMPARISON:  None. FINDINGS: Lower chest: Lung bases demonstrate mild atelectatic changes without sizable effusion. Coronary calcifications are seen. Defibrillator is again noted. Hepatobiliary: No focal liver abnormality is seen. No gallstones, gallbladder wall thickening, or biliary dilatation. Pancreas: Unremarkable. No pancreatic ductal dilatation or surrounding inflammatory changes. Spleen: Normal in size without focal abnormality. Adrenals/Urinary Tract: Adrenal glands are within normal limits. Kidneys are well visualized bilaterally. Right renal cyst is seen. A smaller left renal cyst is noted as well no renal calculi or  obstructive changes are seen. The ureters are within normal limits bilaterally. The bladder is partially distended. Stomach/Bowel: Diverticular changes noted within the colon without evidence of diverticulitis. The appendix is within normal limits. No small bowel inflammatory changes are seen. Stomach is decompressed. Vascular/Lymphatic: Aortic atherosclerosis. No enlarged abdominal or pelvic lymph nodes. Reproductive: Prostate is unremarkable. Other: No abdominal wall hernia or abnormality. No abdominopelvic ascites. Musculoskeletal: Degenerative changes of the lumbar spine are seen. IMPRESSION: Diverticulosis without diverticulitis. Bilateral renal cysts. Mild bibasilar atelectasis. Electronically Signed   By: Inez Catalina M.D.   On: 07/12/2018 19:09   Dg Chest Port 1 View  Result Date: 07/12/2018 CLINICAL DATA:  Wheezing EXAM: PORTABLE CHEST 1 VIEW COMPARISON:  07/11/2018 FINDINGS: Right IJ approach hemodialysis catheter tip is in the right atrium. Unchanged position of left chest wall single lead AICD. Mild interstitial pulmonary edema. Mild cardiomegaly with findings of prior CABG. Trace left pleural effusion. IMPRESSION: Mild cardiomegaly and mild interstitial pulmonary edema. Electronically Signed   By: Ulyses Jarred M.D.   On: 07/12/2018 04:32      Assessment/Plan:  INTERVAL HISTORY: CT abdomen showed diverticula but not active infection  Principal Problem:   E coli bacteremia Active Problems:   Chronic kidney disease (CKD), active medical management without dialysis, stage 3 (moderate) (HCC)   Diabetes mellitus type 2 in nonobese [facility])   Essential hypertension, benign   ICD (implantable cardioverter-defibrillator) in place   Acute kidney injury ([place])   HIV disease ([place])   Chronic combined systolic and diastolic heart failure ([place])   Acute metabolic encephalopathy   Septic shock (HCC)   Encephalitis   ARF (acute renal failure) (HCC)   Sepsis with encephalopathy and septic shock  ([place])   Tachycardia    Keith Young is a 68 y.o. male with well-controlled HIV, chronic kidney disease presented with sepsis due to E. coli bacteremia acute kidney injury and uremia, improved with IV antibiotics and dialysis.  His stool was positive for C. difficile antigen but negative for toxin positive for PCR  #1 E. coli bacteremia:   His transthoracic echocardiogram is clear. CT showed diverticula    I will plan on giving him 2 weeks of therapy with an effective cephalosporin.  #2 Encephalopathy  due to #1   #3  Stool with C. difficile present HE NEEDS TO BE ON ENTERIC PRECAUTIONS and monitored for loose stools  #4 HIV:  Biktarvy   Diagnosis: E. Coli bacteremia  Culture Result:   No Known Allergies  OPAT Orders Discharge antibiotics: Ceftazidime  Duration: 2 weeks End Date:  August 20, 2018  [facility] Care Per Protocol:  Labs weekly while on IV  antibiotics: _x_ CBC with differential _x_ BMP w GFR   Fax weekly labs to [phone]  Clinic Follow Up Appt:  August 15, 2018 @ [place] has an appointment on   [facility] is located in the [facility] at  [address] in [place].  [address], which is located to the left of the elevators.  Phone: [phone]  Fax: [phone]  [email or web address]  Please call with further questions.  Alcide Evener 07/13/2018, 5:27 PM

## 2018-07-14 ENCOUNTER — Inpatient Hospital Stay (HOSPITAL_COMMUNITY): Payer: Medicare HMO

## 2018-07-14 LAB — CULTURE, BLOOD (ROUTINE X 2)
Culture: NO GROWTH
Culture: NO GROWTH
Special Requests: ADEQUATE
Special Requests: ADEQUATE

## 2018-07-14 LAB — CBC WITH DIFFERENTIAL/PLATELET
Abs Immature Granulocytes: 0.04 10*3/uL (ref 0.00–0.07)
Basophils Absolute: 0 10*3/uL (ref 0.0–0.1)
Basophils Relative: 0 %
Eosinophils Absolute: 0 10*3/uL (ref 0.0–0.5)
Eosinophils Relative: 0 %
HCT: 41.3 % (ref 39.0–52.0)
Hemoglobin: 12.7 g/dL — ABNORMAL LOW (ref 13.0–17.0)
IMMATURE GRANULOCYTES: 1 %
Lymphocytes Relative: 17 %
Lymphs Abs: 0.7 10*3/uL (ref 0.7–4.0)
MCH: 27.7 pg (ref 26.0–34.0)
MCHC: 30.8 g/dL (ref 30.0–36.0)
MCV: 90.2 fL (ref 80.0–100.0)
Monocytes Absolute: 0.4 10*3/uL (ref 0.1–1.0)
Monocytes Relative: 9 %
NEUTROS PCT: 73 %
NRBC: 0 % (ref 0.0–0.2)
Neutro Abs: 3.2 10*3/uL (ref 1.7–7.7)
Platelets: 68 10*3/uL — ABNORMAL LOW (ref 150–400)
RBC: 4.58 MIL/uL (ref 4.22–5.81)
RDW: 14.5 % (ref 11.5–15.5)
WBC: 4.3 10*3/uL (ref 4.0–10.5)

## 2018-07-14 LAB — BASIC METABOLIC PANEL
Anion gap: 11 (ref 5–15)
BUN: 53 mg/dL — ABNORMAL HIGH (ref 8–23)
CO2: 23 mmol/L (ref 22–32)
Calcium: 7.1 mg/dL — ABNORMAL LOW (ref 8.9–10.3)
Chloride: 104 mmol/L (ref 98–111)
Creatinine, Ser: 4.43 mg/dL — ABNORMAL HIGH (ref 0.61–1.24)
GFR calc non Af Amer: 13 mL/min — ABNORMAL LOW (ref >60)
GFR, EST AFRICAN AMERICAN: 15 mL/min — AB (ref >60)
Glucose, Bld: 157 mg/dL — ABNORMAL HIGH (ref 70–99)
Potassium: 4.4 mmol/L (ref 3.5–5.1)
SODIUM: 138 mmol/L (ref 135–145)

## 2018-07-14 LAB — GLUCOSE, CAPILLARY
GLUCOSE-CAPILLARY: 175 mg/dL — AB (ref 70–99)
Glucose-Capillary: 124 mg/dL — ABNORMAL HIGH (ref 70–99)
Glucose-Capillary: 145 mg/dL — ABNORMAL HIGH (ref 70–99)
Glucose-Capillary: 147 mg/dL — ABNORMAL HIGH (ref 70–99)
Glucose-Capillary: 165 mg/dL — ABNORMAL HIGH (ref 70–99)
Glucose-Capillary: 248 mg/dL — ABNORMAL HIGH (ref 70–99)

## 2018-07-14 LAB — MAGNESIUM: Magnesium: 2.1 mg/dL (ref 1.7–2.4)

## 2018-07-14 MED ORDER — PREDNISONE 20 MG PO TABS
40.0000 mg | ORAL_TABLET | Freq: Every day | ORAL | Status: DC
  Administered 2018-07-15 – 2018-07-25 (×10): 40 mg via ORAL
  Filled 2018-07-14 (×11): qty 2

## 2018-07-14 MED ORDER — METOPROLOL TARTRATE 12.5 MG HALF TABLET
12.5000 mg | ORAL_TABLET | Freq: Once | ORAL | Status: AC
  Administered 2018-07-14: 12.5 mg via ORAL
  Filled 2018-07-14: qty 1

## 2018-07-14 NOTE — Progress Notes (Signed)
Patient still having multiple episodes of V-tach per tele. Patient resting in bed, asymptomatic, denies chest pain. Fanny Bien notified via text page. Will continue to monitor.

## 2018-07-14 NOTE — Progress Notes (Signed)
Patient ID: Keith Young, male   DOB: November 12, 1950, 68 y.o.   MRN: [id number]  PROGRESS NOTE    Keith Young  BHA:[id number] DOB: Oct 25, 1950 DOA: 07/06/2018 PCP: Virgie Dad, MD   Brief Narrative:  37 year old African-American male with history of chronic systolic CHF with EF of 97% with AICD, CKD stage IV, diabetes mellitus type 2, COPD, hypertension, CVA, HIV presented with high fevers, altered mental status with nausea and vomiting on 07/06/2018 and was diagnosed with sepsis.  ID, neurology and nephrology were consulted.  He was initially started on broad-spectrum antibiotics.  Because of acute kidney injury and altered mental status, he was also started on dialysis.  He was found to have E. coli bacteremia.  He had LP done on 07/10/2018.   Assessment & Plan:   Principal Problem:   E coli bacteremia Active Problems:   Chronic kidney disease (CKD), active medical management without dialysis, stage 3 (moderate) (HCC)   Diabetes mellitus type 2 in nonobese [facility])   Essential hypertension, benign   ICD (implantable cardioverter-defibrillator) in place   Acute kidney injury ([place])   HIV disease ([place])   Chronic combined systolic and diastolic heart failure ([place])   Acute metabolic encephalopathy   Septic shock (HCC)   Encephalitis   ARF (acute renal failure) ([place])   Sepsis with encephalopathy and septic shock (HCC)   Tachycardia   Diverticulosis   Sepsis present on admission -Antibiotic plan as below.  ID following.  Currently hemodynamically stable  E. coli bacteremia -Questionable cause.  ID following.  Currently on ceftazidime as per ID.  ID recommends 2 weeks of IV ceftazidime.  2D echo showed no vegetations.   -Repeat blood culture from 07/09/2018 is negative so far.  CT abdomen pelvis on 07/12/2018 shows diverticulosis without diverticulitis.  Acute metabolic/uremic encephalopathy -Initially there was a concern for meningitis but LP done on 07/10/2018 showed only 3 WBCs.  Hence  patient does not have meningitis.  Acyclovir and meningitis coverage were discontinued - Neurology has signed off; repeat CT of the head on 07/11/2018 showed no acute intracranial abnormality but showed chronic ischemic infarcts.  Patient cannot undergo MRI of the brain.  Keppra has been discontinued -Patient still slightly confused and slow to respond. -Diet as per SLP recommendations.  Acute kidney injury on chronic kidney disease stage IV -Baseline creatinine is around 2.5. -Creatinine 4.43 today.  Patient was started on dialysis on 07/09/2018.  Nephrology following.  Patient is receiving intermittent dialysis as per nephrology.  Wheezing -Questionable cause.  Improving.  Change Solu-Medrol to prednisone 40 mg daily.  Continue Dulera.  HIV -Continue current antiretroviral regimen.  ID following  History of chronic systolic CHF with EF of 35%/HGDJ -Currently volume being managed by dialysis which was started on 07/09/2018.  Continue metoprolol.  Cardiology following.  Strict input and output.  Daily weights  Essential hypertension -Blood pressure stable for now.  Continue metoprolol.  Monitor.  New onset A. fib with RVR -Currently rate controlled.  Patient was on heparin drip.  I discontinued the heparin drip on 07/11/2018 because of thrombocytopenia; cardiology following.  Continue metoprolol.  -We will start Coumadin once thrombocytopenia improves.   Diabetes mellitus type 2 -Continue Accu-Cheks with coverage.  Hemoglobin A1c was 6.5 on 05/22/2018  Thrombocytopenia -Questionable cause.  Improving.  No signs of bleeding.  Monitor   DVT prophylaxis: SCDs Code Status: DNR Family Communication: None at bedside Disposition Plan: Depends on clinical outcome  Consultants: Nephrology/neurology/ID/IR/cardiology  Procedures: IR guided HD  catheter placement on 07/09/2018 LP on 07/10/2018 by IR  Antimicrobials:  Anti-infectives (From admission, onward)   Start     Dose/Rate Route  Frequency Ordered Stop   07/12/18 2200  cefTAZidime (FORTAZ) 1 g in sodium chloride 0.9 % 100 mL IVPB     1 g 200 mL/hr over 30 Minutes Intravenous Every 24 hours 07/12/18 1449     07/10/18 1430  vancomycin (VANCOCIN) IVPB 1000 mg/200 mL premix  Status:  Discontinued     1,000 mg 200 mL/hr over 60 Minutes Intravenous Every 48 hours 07/08/18 1421 07/09/18 0959   07/10/18 1000  cefTRIAXone (ROCEPHIN) 2 g in sodium chloride 0.9 % 100 mL IVPB  Status:  Discontinued     2 g 200 mL/hr over 30 Minutes Intravenous Every 24 hours 07/10/18 0942 07/12/18 1433   07/10/18 0847  cefTRIAXone (ROCEPHIN) 2 g in sodium chloride 0.9 % 100 mL IVPB  Status:  Discontinued     2 g 200 mL/hr over 30 Minutes Intravenous Every 24 hours 07/09/18 1004 07/10/18 0933   07/09/18 1400  acyclovir (ZOVIRAX) 745 mg in dextrose 5 % 150 mL IVPB  Status:  Discontinued     10 mg/kg  74.5 kg 164.9 mL/hr over 60 Minutes Intravenous Every 24 hours 07/08/18 1421 07/09/18 0959   07/09/18 1300  bictegravir-emtricitabine-tenofovir AF (BIKTARVY) 50-200-25 MG per tablet 1 tablet     1 tablet Oral Daily 07/09/18 1012     07/08/18 2300  cefTRIAXone (ROCEPHIN) 2 g in sodium chloride 0.9 % 100 mL IVPB  Status:  Discontinued     2 g 200 mL/hr over 30 Minutes Intravenous Every 12 hours 07/08/18 1341 07/09/18 1004   07/08/18 1800  vancomycin (VANCOCIN) IVPB 1000 mg/200 mL premix  Status:  Discontinued     1,000 mg 200 mL/hr over 60 Minutes Intravenous Every 48 hours 07/06/18 2019 07/07/18 1137   07/08/18 1430  ampicillin (OMNIPEN) 2 g in sodium chloride 0.9 % 100 mL IVPB  Status:  Discontinued     2 g 300 mL/hr over 20 Minutes Intravenous Every 12 hours 07/08/18 1343 07/09/18 0959   07/08/18 1430  vancomycin (VANCOCIN) 1,500 mg in sodium chloride 0.9 % 500 mL IVPB     1,500 mg 250 mL/hr over 120 Minutes Intravenous  Once 07/08/18 1421 07/08/18 1723   07/08/18 1400  acyclovir (ZOVIRAX) 745 mg in dextrose 5 % 150 mL IVPB     10 mg/kg  74.5  kg 164.9 mL/hr over 60 Minutes Intravenous  Once 07/08/18 1345 07/09/18 1315   07/07/18 1800  ceFEPIme (MAXIPIME) 1 g in sodium chloride 0.9 % 100 mL IVPB  Status:  Discontinued     1 g 200 mL/hr over 30 Minutes Intravenous Every 24 hours 07/06/18 2019 07/07/18 1137   07/07/18 1145  cefTRIAXone (ROCEPHIN) 2 g in sodium chloride 0.9 % 100 mL IVPB  Status:  Discontinued     2 g 200 mL/hr over 30 Minutes Intravenous Every 24 hours 07/07/18 1137 07/08/18 1341   07/07/18 1000  bictegravir-emtricitabine-tenofovir AF (BIKTARVY) 50-200-25 MG per tablet 1 tablet  Status:  Discontinued     1 tablet Oral Daily 07/07/18 0058 07/07/18 1127   07/06/18 1900  ceFEPIme (MAXIPIME) 2 g in sodium chloride 0.9 % 100 mL IVPB     2 g 200 mL/hr over 30 Minutes Intravenous  Once 07/06/18 1859 07/06/18 2001   07/06/18 1900  vancomycin (VANCOCIN) IVPB 1000 mg/200 mL premix     1,000 mg 200  mL/hr over 60 Minutes Intravenous  Once 07/06/18 1859 07/06/18 2035        Subjective: Patient seen and examined at bedside.  He is more awake this morning but is pleasantly confused.  No overnight fever, nausea or vomiting reported. Objective: Vitals:   07/13/18 2050 07/13/18 2207 07/14/18 0412 07/14/18 0528  BP:  125/79 (!) 137/95   Pulse:  69 74   Resp:  18 18   Temp:  98.3 F (36.8 C) 97.7 F (36.5 C)   TempSrc:  Oral    SpO2: 97% 96% 95%   Weight:    72.9 kg  Height:        Intake/Output Summary (Last 24 hours) at 07/14/2018 1107 Last data filed at 07/14/2018 1019 Gross per 24 hour  Intake 615 ml  Output 1250 ml  Net -635 ml   Filed Weights   07/13/18 1155 07/13/18 1542 07/14/18 0528  Weight: 75.2 kg 74.2 kg 72.9 kg    Examination:  General exam: Appears calm and comfortable.  No acute distress.  More awake this morning, pleasantly confused  respiratory system: Bilateral decreased breath sounds at bases with scattered crackles, wheezing has improved Cardiovascular system: Rate controlled, S1 & S2  heard Gastrointestinal system: Abdomen is nondistended, soft and nontender. Normal bowel sounds heard. Extremities: No cyanosis; bilateral lower extremity edema   Data Reviewed: I have personally reviewed following labs and imaging studies  CBC: Recent Labs  Lab 07/09/18 0437 07/10/18 0317 07/11/18 0425 07/12/18 0336 07/13/18 0337 07/14/18 0531  WBC 16.0* 11.8* 4.5 2.4* 3.3* 4.3  NEUTROABS 14.9*  --   --   --  2.3 3.2  HGB 12.5* 12.4* 12.7* 13.5 13.2 12.7*  HCT 39.8 39.3 40.8 42.9 41.9 41.3  MCV 90.0 90.8 93.4 92.5 91.7 90.2  PLT 73* 68* 56* 51* 71* 68*   Basic Metabolic Panel: Recent Labs  Lab 07/10/18 0317 07/11/18 0235 07/12/18 0336 07/13/18 0337 07/14/18 0531  NA 145 144 142 143 138  K 3.9 3.9 4.3 4.7 4.4  CL 111 107 108 109 104  CO2 25 26 25 24 23   GLUCOSE 82 114* 91 161* 157*  BUN 62* 73* 44* 72* 53*  CREATININE 4.70* 5.08* 4.10* 5.74* 4.43*  CALCIUM 7.2* 7.7* 7.3* 7.4* 7.1*  MG  --  1.9 1.8 2.1 2.1   GFR: Estimated Creatinine Clearance: 14.7 mL/min (A) (by C-G formula based on SCr of 4.43 mg/dL (H)). Liver Function Tests: Recent Labs  Lab 07/07/18 1139 07/08/18 0809 07/09/18 0437  AST 161* 80* 57*  ALT 165* 117* 79*  ALKPHOS 84 78 78  BILITOT 1.1 0.9 0.8  PROT 6.1* 6.1* 5.9*  ALBUMIN 2.5* 2.3* 2.1*   No results for input(s): LIPASE, AMYLASE in the last 168 hours. Recent Labs  Lab 07/08/18 1204  AMMONIA 33   Coagulation Profile: No results for input(s): INR, PROTIME in the last 168 hours. Cardiac Enzymes: No results for input(s): CKTOTAL, CKMB, CKMBINDEX, TROPONINI in the last 168 hours. BNP (last 3 results) No results for input(s): PROBNP in the last 8760 hours. HbA1C: No results for input(s): HGBA1C in the last 72 hours. CBG: Recent Labs  Lab 07/13/18 1704 07/13/18 2045 07/14/18 0007 07/14/18 0409 07/14/18 0826  GLUCAP 147* 153* 145* 147* 124*   Lipid Profile: No results for input(s): CHOL, HDL, LDLCALC, TRIG, CHOLHDL, LDLDIRECT  in the last 72 hours. Thyroid Function Tests: No results for input(s): TSH, T4TOTAL, FREET4, T3FREE, THYROIDAB in the last 72 hours. Anemia Panel: No  results for input(s): VITAMINB12, FOLATE, FERRITIN, TIBC, IRON, RETICCTPCT in the last 72 hours. Sepsis Labs: Recent Labs  Lab 07/07/18 1139 07/07/18 1651 07/08/18 1626 07/08/18 1952 07/09/18 0437 07/10/18 0317  PROCALCITON  --   --  >150.00  --  >150.00 >150.00  LATICACIDVEN 3.3* 3.0* 1.6 1.5  --   --     Recent Results (from the past 240 hour(s))  Blood Culture (routine x 2)     Status: Abnormal   Collection Time: 07/06/18  7:07 PM  Result Value Ref Range Status   Specimen Description BLOOD RIGHT FOREARM  Final   Special Requests   Final    BOTTLES DRAWN AEROBIC AND ANAEROBIC Blood Culture adequate volume   Culture  Setup Time   Final    GRAM NEGATIVE RODS ANAEROBIC BOTTLE ONLY CRITICAL VALUE NOTED.  VALUE IS CONSISTENT WITH PREVIOUSLY REPORTED AND CALLED VALUE.    Culture (A)  Final    ESCHERICHIA COLI SUSCEPTIBILITIES PERFORMED ON PREVIOUS CULTURE WITHIN THE LAST 5 DAYS. Performed at [facility], [place] [address] [place], [place] [address]    Report Status 07/11/2018 FINAL  Final  Blood Culture (routine x 2)     Status: Abnormal   Collection Time: 07/06/18  7:40 PM  Result Value Ref Range Status   Specimen Description BLOOD LEFT ANTECUBITAL  Final   Special Requests   Final    BOTTLES DRAWN AEROBIC AND ANAEROBIC Blood Culture adequate volume   Culture  Setup Time   Final    GRAM NEGATIVE RODS IN BOTH AEROBIC AND ANAEROBIC BOTTLES CRITICAL RESULT CALLED TO, READ BACK BY AND VERIFIED WITHRonnald Nian [id number] FCP Performed at [facility], [place] [address] [place], [place] [address]    Culture ESCHERICHIA COLI (A)  Final   Report Status 07/09/2018 FINAL  Final   Organism ID, Bacteria ESCHERICHIA COLI  Final      Susceptibility   Escherichia coli - MIC*    AMPICILLIN >=32 RESISTANT Resistant       CEFAZOLIN 16 SENSITIVE Sensitive     CEFEPIME <=1 SENSITIVE Sensitive     CEFTAZIDIME <=1 SENSITIVE Sensitive     CEFTRIAXONE <=1 SENSITIVE Sensitive     CIPROFLOXACIN <=0.25 SENSITIVE Sensitive     GENTAMICIN <=1 SENSITIVE Sensitive     IMIPENEM <=0.25 SENSITIVE Sensitive     TRIMETH/SULFA <=20 SENSITIVE Sensitive     AMPICILLIN/SULBACTAM >=32 RESISTANT Resistant     PIP/TAZO <=4 SENSITIVE Sensitive     Extended ESBL NEGATIVE Sensitive     * ESCHERICHIA COLI  Blood Culture ID Panel (Reflexed)     Status: Abnormal   Collection Time: 07/06/18  7:40 PM  Result Value Ref Range Status   Enterococcus species NOT DETECTED NOT DETECTED Final   Listeria monocytogenes NOT DETECTED NOT DETECTED Final   Staphylococcus species NOT DETECTED NOT DETECTED Final   Staphylococcus aureus (BCID) NOT DETECTED NOT DETECTED Final   Streptococcus species NOT DETECTED NOT DETECTED Final   Streptococcus agalactiae NOT DETECTED NOT DETECTED Final   Streptococcus pneumoniae NOT DETECTED NOT DETECTED Final   Streptococcus pyogenes NOT DETECTED NOT DETECTED Final   Acinetobacter baumannii NOT DETECTED NOT DETECTED Final   Enterobacteriaceae species DETECTED (A) NOT DETECTED Final    Comment: Enterobacteriaceae represent a large family of gram-negative bacteria, not a single organism. CRITICAL RESULT CALLED TO, READ BACK BY AND VERIFIED WITH: PHARMD MANCHIL, B [phone] FCP    Enterobacter cloacae complex NOT DETECTED  NOT DETECTED Final   Escherichia coli DETECTED (A) NOT DETECTED Final    Comment: CRITICAL RESULT CALLED TO, READ BACK BY AND VERIFIED WITH: PHARMD MANCHIL, B [phone] FCP    Klebsiella oxytoca NOT DETECTED NOT DETECTED Final   Klebsiella pneumoniae NOT DETECTED NOT DETECTED Final   Proteus species NOT DETECTED NOT DETECTED Final   Serratia marcescens NOT DETECTED NOT DETECTED Final   Carbapenem resistance NOT DETECTED NOT DETECTED Final   Haemophilus influenzae NOT DETECTED NOT  DETECTED Final   Neisseria meningitidis NOT DETECTED NOT DETECTED Final   Pseudomonas aeruginosa NOT DETECTED NOT DETECTED Final   Candida albicans NOT DETECTED NOT DETECTED Final   Candida glabrata NOT DETECTED NOT DETECTED Final   Candida krusei NOT DETECTED NOT DETECTED Final   Candida parapsilosis NOT DETECTED NOT DETECTED Final   Candida tropicalis NOT DETECTED NOT DETECTED Final    Comment: Performed at [facility], [place] [address], [place], [place] [address]  MRSA PCR Screening     Status: None   Collection Time: 07/07/18  3:24 PM  Result Value Ref Range Status   MRSA by PCR NEGATIVE NEGATIVE Final    Comment:        The GeneXpert MRSA Assay (FDA approved for NASAL specimens only), is one component of a comprehensive MRSA colonization surveillance program. It is not intended to diagnose MRSA infection nor to guide or monitor treatment for MRSA infections. Performed at [facility], [place] [address], [place], [place] [address]   Culture, Urine     Status: None   Collection Time: 07/08/18  7:47 AM  Result Value Ref Range Status   Specimen Description URINE, RANDOM  Final   Special Requests NONE  Final   Culture   Final    NO GROWTH Performed at [facility], [place] [address], [place], [place] [address]    Report Status 07/09/2018 FINAL  Final  Culture, blood (Routine X 2) w Reflex to ID Panel     Status: None   Collection Time: 07/09/18 10:18 AM  Result Value Ref Range Status   Specimen Description BLOOD RIGHT HAND  Final   Special Requests   Final    BOTTLES DRAWN AEROBIC AND ANAEROBIC Blood Culture adequate volume   Culture   Final    NO GROWTH 5 DAYS Performed at [facility], [place] [address], [place], [place] [address]    Report Status 07/14/2018 FINAL  Final  Culture, blood (Routine X 2) w Reflex to ID Panel     Status: None   Collection Time: 07/09/18 10:25 AM  Result Value Ref Range Status   Specimen Description BLOOD LEFT HAND  Final    Special Requests   Final    BOTTLES DRAWN AEROBIC AND ANAEROBIC Blood Culture adequate volume   Culture   Final    NO GROWTH 5 DAYS Performed at [facility], [place] [address], [place], [place] [address]    Report Status 07/14/2018 FINAL  Final  C difficile quick scan w PCR reflex     Status: Abnormal   Collection Time: 07/09/18 10:38 AM  Result Value Ref Range Status   C Diff antigen POSITIVE (A) NEGATIVE Final   C Diff toxin NEGATIVE NEGATIVE Final   C Diff interpretation Results are indeterminate. See PCR results.  Final    Comment: Performed at [facility], [place] [address], [place],  [address]  C. Diff by PCR, Reflexed  Status: Abnormal   Collection Time: 07/09/18 10:38 AM  Result Value Ref Range Status   Toxigenic C. Difficile by PCR POSITIVE (A) NEGATIVE Final    Comment: Positive for toxigenic C. difficile with little to no toxin production. Only treat if clinical presentation suggests symptomatic illness. Performed at [facility], [place] [address], [place], [place] [address]   CSF culture     Status: None   Collection Time: 07/10/18  2:44 PM  Result Value Ref Range Status   Specimen Description CSF  Final   Special Requests NONE  Final   Gram Stain   Final    WBC PRESENT,BOTH PMN AND MONONUCLEAR NO ORGANISMS SEEN CYTOSPIN SMEAR    Culture   Final    NO GROWTH 3 DAYS Performed at [facility], [place] [address], [place], [place] [address]    Report Status 07/13/2018 FINAL  Final  Fungus Culture With Stain     Status: None (Preliminary result)   Collection Time: 07/10/18  2:44 PM  Result Value Ref Range Status   Fungus Stain Final report  Final    Comment: (NOTE) Performed At: [facility] [place], [place] [id number] Rush Farmer MD QZ:[id number]    Fungus (Mycology) Culture PENDING  Incomplete   Fungal Source CSF  Final  Fungus Culture Result     Status: None   Collection Time: 07/10/18  2:44 PM  Result Value  Ref Range Status   Result 1 Comment  Final    Comment: (NOTE) KOH/Calcofluor preparation:  no fungus observed. Performed At: [facility] [place], [place] [id number] Rush Farmer MD GY:[id number]          Radiology Studies: Ct Abdomen Pelvis Wo Contrast  Result Date: 07/12/2018 CLINICAL DATA:  Bacteremia EXAM: CT ABDOMEN AND PELVIS WITHOUT CONTRAST TECHNIQUE: Multidetector CT imaging of the abdomen and pelvis was performed following the standard protocol without IV contrast. COMPARISON:  None. FINDINGS: Lower chest: Lung bases demonstrate mild atelectatic changes without sizable effusion. Coronary calcifications are seen. Defibrillator is again noted. Hepatobiliary: No focal liver abnormality is seen. No gallstones, gallbladder wall thickening, or biliary dilatation. Pancreas: Unremarkable. No pancreatic ductal dilatation or surrounding inflammatory changes. Spleen: Normal in size without focal abnormality. Adrenals/Urinary Tract: Adrenal glands are within normal limits. Kidneys are well visualized bilaterally. Right renal cyst is seen. A smaller left renal cyst is noted as well no renal calculi or obstructive changes are seen. The ureters are within normal limits bilaterally. The bladder is partially distended. Stomach/Bowel: Diverticular changes noted within the colon without evidence of diverticulitis. The appendix is within normal limits. No small bowel inflammatory changes are seen. Stomach is decompressed. Vascular/Lymphatic: Aortic atherosclerosis. No enlarged abdominal or pelvic lymph nodes. Reproductive: Prostate is unremarkable. Other: No abdominal wall hernia or abnormality. No abdominopelvic ascites. Musculoskeletal: Degenerative changes of the lumbar spine are seen. IMPRESSION: Diverticulosis without diverticulitis. Bilateral renal cysts. Mild bibasilar atelectasis. Electronically Signed   By: Inez Catalina M.D.   On: 07/12/2018 19:09        Scheduled  Meds: . bictegravir-emtricitabine-tenofovir AF  1 tablet Oral Daily  . chlorhexidine  15 mL Mouth Rinse BID  . Chlorhexidine Gluconate Cloth  6 each Topical Q0600  . Chlorhexidine Gluconate Cloth  6 each Topical Q0600  . Chlorhexidine Gluconate Cloth  6 each Topical Q0600  . insulin aspart  0-9 Units Subcutaneous Q4H  . mouth rinse  15 mL Mouth Rinse q12n4p  . methylPREDNISolone (SOLU-MEDROL)  injection  40 mg Intravenous Q12H  . metoprolol succinate  25 mg Oral Daily  . mometasone-formoterol  2 puff Inhalation BID   Continuous Infusions: . sodium chloride 250 mL (07/14/18 0040)  . cefTAZidime (FORTAZ)  IV Stopped (07/13/18 2135)     LOS: 7 days        Aline August, MD [facility] Pager [phone]  If 7PM-7AM, please contact night-coverage [email or web address] Password [facility] 07/14/2018, 11:07 AM

## 2018-07-14 NOTE — Progress Notes (Signed)
  Speech Language Pathology Treatment: Dysphagia  Patient Details Name: Keith Young MRN: [id number] DOB: [birth year] Today's Date: 07/14/2018 Time: 7035-0093 SLP Time Calculation (min) (ACUTE ONLY): 15 min  Assessment / Plan / Recommendation Clinical Impression  Pt seen at bedside for follow-up for training in aspiration precautions and compensatory strategies after MBS. When SLP arrived, pt in bed consuming items from clear liquids tray, coughing and wheezing. Moderate aspiration was seen on MBS without use of chin tuck. SLP educated re: rationale and swallow precautions; pt appears to have poor understanding. SLP used straw for chin tuck; pt did not limit bolus size or rate of intake without SLP assist to occlude straw; usual mod-max a required for chin tuck. Overt coughing x1 after consecutive swallows. Wheezing persisted throughout session but did not appear to increase from baseline. As pt not implementing strategies prior to SLP arrival, difficult to determine if wheezing due to new aspiration events vs events prior to SLP arrival. Pt left upright in bed, with tray out of reach. D/w RN pt's need for 1:1 assistance due to level of cuing needed for chin tuck and to curb impulsivity. If wheezing/coughing persists with chin tuck, advise NPO and page SLP for reassessment.      HPI HPI: Keith Young is a 68 y.o. male with PMH significant of HIV, type 2 diabetes, COPD, CVA ([address]29, HTN, Bells Palsy, systolic CHF, CKD, and history of AICD placement followed by cardiology.  Pt was admitted to [facility] ED 07/07/18 and after presenting with AMS at SNF residence, vomitted, and became increasingly unresponsive. Per PA note, pt was found to be encephalopathic secondary to sepsis and renal failure. Pt seen by ST in Jan 2018; BSE recc D2/thin. CXR showed cardiomegaly and small left pleural effusion. Head CT revealed new age indeterminate RIGHT basal ganglia small infarcts, old RIGHT temporal occipital/PCA territory  infarct, old basal ganglia, thalami, pontine and cerebellar small infarcts.      SLP Plan  Continue with current plan of care       Recommendations  Diet recommendations: Thin liquid;Dysphagia 2 (fine chop) Liquids provided via: Straw;Cup(Single sips, must tuck chin) Medication Administration: Whole meds with puree Supervision: Full supervision/cueing for compensatory strategies(1:1 assist with chin tuck) Compensations: Slow rate;Small sips/bites;Chin tuck;Clear throat intermittently;Use straw to facilitate chin tuck Postural Changes and/or Swallow Maneuvers: Seated upright 90 degrees                Oral Care Recommendations: Oral care QID;Oral care prior to ice chip/H20 Follow up Recommendations: Skilled Nursing facility SLP Visit Diagnosis: Dysphagia, oral phase (R13.11);Dysphagia, pharyngeal phase (R13.13) Plan: Continue with current plan of care       [place], [place], [place] Pathologist Acute Rehabilitation Services Pager: [phone] Office: [phone]   Aliene Altes 07/14/2018, 2:29 PM

## 2018-07-14 NOTE — Progress Notes (Signed)
Per tele, patient had 5 beat run of V-tach then 12 beat run of V-tach. Patient sleeping. Baltazar Najjar, NP notified via text page. No new orders as of yet.

## 2018-07-14 NOTE — Progress Notes (Signed)
This RN spoke to Keith Young via telephone. Tele had called back, stating patient had another episode of V-tach with wider QRS initially. Rhythm returns to baseline. Keith Young notified via telephone. Order for Metoprolol placed. Will continue to monitor.

## 2018-07-14 NOTE — Progress Notes (Signed)
[facility]    NEPHROLOGY PROGRESS NOTE  SUBJECTIVE:  No particular complaints this AM. HD yesterday tol ok.  On way for MBS per SLP this AM.   OBJECTIVE:  Vitals:   07/13/18 2207 07/14/18 0412  BP: 125/79 (!) 137/95  Pulse: 69 74  Resp: 18 18  Temp: 98.3 F (36.8 C) 97.7 F (36.5 C)  SpO2: 96% 95%    Intake/Output Summary (Last 24 hours) at 07/14/2018 1117 Last data filed at 07/14/2018 1019 Gross per 24 hour  Intake 435 ml  Output 1250 ml  Net -815 ml      Genearl: comfortably laying at 30 deg, arouses to voice, localizes, answers simple questions. HEENT: MMM [place] AT anicteric sclera Neck:  RIJ temp cath c/d/i CV:  Heart RRR  Lungs:  L/S CTA bilaterally Abd:  abd SNT/ND with normal BS GU:  Bladder non-palpable, Foley catheter in place Extremities:  No LE edema. Skin:  No skin rash Neuro: oriented to self only, "I dont know' to other questions.   MEDICATIONS:  . bictegravir-emtricitabine-tenofovir AF  1 tablet Oral Daily  . chlorhexidine  15 mL Mouth Rinse BID  . Chlorhexidine Gluconate Cloth  6 each Topical Q0600  . Chlorhexidine Gluconate Cloth  6 each Topical Q0600  . Chlorhexidine Gluconate Cloth  6 each Topical Q0600  . insulin aspart  0-9 Units Subcutaneous Q4H  . mouth rinse  15 mL Mouth Rinse q12n4p  . metoprolol succinate  25 mg Oral Daily  . mometasone-formoterol  2 puff Inhalation BID  . [START ON 07/15/2018] predniSONE  40 mg Oral Q breakfast       LABS:   CBC Latest Ref Rng & Units 07/14/2018 07/13/2018 07/12/2018  WBC 4.0 - 10.5 K/uL 4.3 3.3(L) 2.4(L)  Hemoglobin 13.0 - 17.0 g/dL 12.7(L) 13.2 13.5  Hematocrit 39.0 - 52.0 % 41.3 41.9 42.9  Platelets 150 - 400 K/uL 68(L) 71(L) 51(L)    CMP Latest Ref Rng & Units 07/14/2018 07/13/2018 07/12/2018  Glucose 70 - 99 mg/dL 157(H) 161(H) 91  BUN 8 - 23 mg/dL 53(H) 72(H) 44(H)  Creatinine 0.61 - 1.24 mg/dL 4.43(H) 5.74(H) 4.10(H)  Sodium 135 - 145 mmol/L 138 143 142  Potassium 3.5 - 5.1  mmol/L 4.4 4.7 4.3  Chloride 98 - 111 mmol/L 104 109 108  CO2 22 - 32 mmol/L 23 24 25   Calcium 8.9 - 10.3 mg/dL 7.1(L) 7.4(L) 7.3(L)  Total Protein 6.5 - 8.1 g/dL - - -  Total Bilirubin 0.3 - 1.2 mg/dL - - -  Alkaline Phos 38 - 126 U/L - - -  AST 15 - 41 U/L - - -  ALT 0 - 44 U/L - - -    Lab Results  Component Value Date   PTH 52 04/17/2018   CALCIUM 7.1 (L) 07/14/2018   CAION 1.27 (H) 11/28/2015   PHOS 3.4 04/17/2018       Component Value Date/Time   COLORURINE AMBER (A) 07/08/2018 0839   APPEARANCEUR CLOUDY (A) 07/08/2018 0839   LABSPEC 1.016 07/08/2018 0839   PHURINE 5.0 07/08/2018 0839   GLUCOSEU NEGATIVE 07/08/2018 0839   HGBUR SMALL (A) 07/08/2018 0839   BILIRUBINUR NEGATIVE 07/08/2018 0839   KETONESUR 5 (A) 07/08/2018 0839   PROTEINUR 100 (A) 07/08/2018 0839   UROBILINOGEN 0.2 10/23/2013 1545   NITRITE NEGATIVE 07/08/2018 0839   LEUKOCYTESUR LARGE (A) 07/08/2018 0839      Component Value Date/Time   PHART 7.346 (L) 07/08/2018 1235   PCO2ART 32.0  07/08/2018 1235   PO2ART 86.4 07/08/2018 1235   HCO3 17.0 (L) 07/08/2018 1235   TCO2 25 11/28/2015 1304   ACIDBASEDEF 7.5 (H) 07/08/2018 1235   O2SAT 95.9 07/08/2018 1235       Component Value Date/Time   IRON 40 (L) 04/17/2018 0734   TIBC 260 04/17/2018 0734   FERRITIN 39 04/17/2018 0734   IRONPCTSAT 15 (L) 04/17/2018 0734       ASSESSMENT/PLAN:      1.  Chronic kidney disease stage IV with a baseline serum creatinine around 2.4-2.8.  I suspect his chronic kidney disease on the basis of longstanding hypertension, diabetes, atherosclerotic cardiovascular disease, and possibly HIV.  2.  Acute kidney injury.  I suspect this is on the basis of sepsis and decreased renal perfusion.  Dialysis was initiated on 2/10 for uremia causing/contributing to AMS.  At this point he has been requiring regularly. Continue to monitor for recovery of renal function.  I think there is a reasonable chance for recovery here.  Has  temp cath currently.  Day to day assessment for HD.  If no recovery is noted into next week will need tunneled cath.   3.  Sepsis secondary to E coli in urine and blood.  Continue antibiotics per ID - plan 2 wk course.  TEE Ok.  CT A/P showed diverticula.   4.  Metabolic encephalopathy.  Likely secondary to sepsis complicated by uremia.  LP not c/w infection.  Improving daily, slow process though.  MBS today.   5.  Hyperkalemia.  resolved  6.  Bioprosthetic AVR, Chronic dual HF with EF , A fib with RVR:  Cardiology following.  Anticoagulation on hold in light of thrombocytopenia, rec start when able (deferred coumadin v eliquis to nephrology; I recommend coumadin when able over eliquis)  7. HIV: on biktarvy   [name] MD

## 2018-07-14 NOTE — Progress Notes (Signed)
Modified Barium Swallow Progress Note  Patient Details  Name: Keith Young MRN: [id number] Date of Birth: 07/24/1950  Today's Date: 07/14/2018  Modified Barium Swallow completed.  Full report located under Chart Review in the Imaging Section.  Brief recommendations include the following:  Clinical Impression  Pt presents with primary moderate oral dysphagia, mild pharyngeal dysphagia. There is lingual pumping, decreased bolus cohesion, piecemeal deglutition and premature spillage (to pyriforms with liquids, valleculae with solids). Premature spillage results in intermittent deep larygneal penetration of thin, nectar, and honey thick liquids, with aspiration (sensed and silent) of thin and nectar. Due to impulsivity pt at risk for aspiration with liquids of any consistency. With solids, pt without rotary chew, lingual thrusting noted with poor cohesion and spillage of solids to the valleculae. SLP used straw to facilitate chin tuck, which prevented aspiration with thin liquids, including small and large sips and consecutive boluses. There was trace shallow penetration with chin tuck which reversed with swallow. Brief stasis of barium tablet in mid-thoracic esophagus, cleared pt's GE junction with liquid wash. Recommend dys 2, thin liquids, meds whole in puree; pt must tuck chin when swallowing for airway protection. Recommend full supervision due to level of cuing necessary. SLP will follow for tolerance and training in swallow precautions/compensations.    Swallow Evaluation Recommendations       SLP Diet Recommendations: Dysphagia 2 (Fine chop) solids;Thin liquid(must use chin tuck)   Liquid Administration via: Straw   Medication Administration: Whole meds with puree   Supervision: Full supervision/cueing for compensatory strategies   Compensations: Slow rate;Small sips/bites;Chin tuck;Clear throat intermittently;Use straw to facilitate chin tuck       Oral Care Recommendations: Oral  care BID      Deneise Lever, [place], CCC-SLP Speech-Language Pathologist Acute Rehabilitation Services Pager: [phone] Office: [phone]   Aliene Altes 07/14/2018,1:45 PM

## 2018-07-14 NOTE — Progress Notes (Signed)
Pharmacist Heart Failure Core Measure Documentation  Assessment: Keith Young has an EF documented as 20-25% on 07/12/18 by ECHO.  Rationale: Heart failure patients with left ventricular systolic dysfunction (LVSD) and an EF < 40% should be prescribed an angiotensin converting enzyme inhibitor (ACEI) or angiotensin receptor blocker (ARB) at discharge unless a contraindication is documented in the medical record.  This patient is not currently on an ACEI or ARB for HF.  This note is being placed in the record in order to provide documentation that a contraindication to the use of these agents is present for this encounter.  ACE Inhibitor or Angiotensin Receptor Blocker is contraindicated (specify all that apply)  []   ACEI allergy AND ARB allergy []   Angioedema []   Moderate or severe aortic stenosis []   Hyperkalemia []   Hypotension []   Renal artery stenosis [x]   Worsening renal function, preexisting renal disease or dysfunction   Keith Young, PharmD PGY1 Pharmacy Resident Phone [phone] 07/14/2018     3:20 PM

## 2018-07-14 NOTE — Progress Notes (Signed)
SLP Cancellation Note  Patient Details Name: Keith Young MRN: [id number] DOB: 03-16-1951   Cancelled treatment:       Reason Eval/Treat Not Completed: Other (comment). Attempting to schedule MBS for today. Fluoro schedule limited; will follow up next date if radiology unable to complete.   Deneise Lever, [place], CCC-SLP Speech-Language Pathologist Acute Rehabilitation Services Pager: [phone] Office: [phone]    Aliene Altes 07/14/2018, 10:06 AM

## 2018-07-15 LAB — CBC WITH DIFFERENTIAL/PLATELET
Abs Immature Granulocytes: 0.08 10*3/uL — ABNORMAL HIGH (ref 0.00–0.07)
Basophils Absolute: 0 10*3/uL (ref 0.0–0.1)
Basophils Relative: 0 %
Eosinophils Absolute: 0 10*3/uL (ref 0.0–0.5)
Eosinophils Relative: 0 %
HCT: 38.4 % — ABNORMAL LOW (ref 39.0–52.0)
Hemoglobin: 12.4 g/dL — ABNORMAL LOW (ref 13.0–17.0)
Immature Granulocytes: 2 %
Lymphocytes Relative: 16 %
Lymphs Abs: 0.7 10*3/uL (ref 0.7–4.0)
MCH: 29.2 pg (ref 26.0–34.0)
MCHC: 32.3 g/dL (ref 30.0–36.0)
MCV: 90.4 fL (ref 80.0–100.0)
Monocytes Absolute: 0.5 10*3/uL (ref 0.1–1.0)
Monocytes Relative: 12 %
Neutro Abs: 3 10*3/uL (ref 1.7–7.7)
Neutrophils Relative %: 70 %
Platelets: 82 10*3/uL — ABNORMAL LOW (ref 150–400)
RBC: 4.25 MIL/uL (ref 4.22–5.81)
RDW: 14.5 % (ref 11.5–15.5)
WBC: 4.3 10*3/uL (ref 4.0–10.5)
nRBC: 0 % (ref 0.0–0.2)

## 2018-07-15 LAB — BASIC METABOLIC PANEL WITH GFR
Anion gap: 7 (ref 5–15)
BUN: 77 mg/dL — ABNORMAL HIGH (ref 8–23)
CO2: 27 mmol/L (ref 22–32)
Calcium: 6.7 mg/dL — ABNORMAL LOW (ref 8.9–10.3)
Chloride: 104 mmol/L (ref 98–111)
Creatinine, Ser: 5.55 mg/dL — ABNORMAL HIGH (ref 0.61–1.24)
GFR calc Af Amer: 11 mL/min — ABNORMAL LOW
GFR calc non Af Amer: 10 mL/min — ABNORMAL LOW
Glucose, Bld: 115 mg/dL — ABNORMAL HIGH (ref 70–99)
Potassium: 3.9 mmol/L (ref 3.5–5.1)
Sodium: 138 mmol/L (ref 135–145)

## 2018-07-15 LAB — GLUCOSE, CAPILLARY
Glucose-Capillary: 108 mg/dL — ABNORMAL HIGH (ref 70–99)
Glucose-Capillary: 125 mg/dL — ABNORMAL HIGH (ref 70–99)
Glucose-Capillary: 136 mg/dL — ABNORMAL HIGH (ref 70–99)
Glucose-Capillary: 252 mg/dL — ABNORMAL HIGH (ref 70–99)
Glucose-Capillary: 275 mg/dL — ABNORMAL HIGH (ref 70–99)
Glucose-Capillary: 89 mg/dL (ref 70–99)

## 2018-07-15 MED ORDER — RESOURCE THICKENUP CLEAR PO POWD
ORAL | Status: DC | PRN
  Filled 2018-07-15: qty 125

## 2018-07-15 MED ORDER — SODIUM CHLORIDE 0.9% FLUSH
10.0000 mL | INTRAVENOUS | Status: DC | PRN
  Administered 2018-07-17: 10 mL
  Administered 2018-07-19: 20 mL
  Filled 2018-07-15 (×2): qty 40

## 2018-07-15 NOTE — Progress Notes (Signed)
Patient ID: KALIK HOARE, male   DOB: 1951/01/20, 68 y.o.   MRN: [id number]  PROGRESS NOTE    DAMIEN BATTY  OAC:[id number] DOB: Feb 29, 1951 DOA: 07/06/2018 PCP: Virgie Dad, MD   Brief Narrative:  89 year old African-American male with history of chronic systolic CHF with EF of 01% with AICD, CKD stage IV, diabetes mellitus type 2, COPD, hypertension, CVA, HIV presented with high fevers, altered mental status with nausea and vomiting on 07/06/2018 and was diagnosed with sepsis.  ID, neurology and nephrology were consulted.  He was initially started on broad-spectrum antibiotics.  Because of acute kidney injury and altered mental status, he was also started on dialysis.  He was found to have E. coli bacteremia.  He had LP done on 07/10/2018.   Assessment & Plan:   Principal Problem:   E coli bacteremia Active Problems:   Chronic kidney disease (CKD), active medical management without dialysis, stage 3 (moderate) (HCC)   Diabetes mellitus type 2 in nonobese [facility])   Essential hypertension, benign   ICD (implantable cardioverter-defibrillator) in place   Acute kidney injury ([place])   HIV disease ([place])   Chronic combined systolic and diastolic heart failure ([place])   Acute metabolic encephalopathy   Septic shock (HCC)   Encephalitis   ARF (acute renal failure) ([place])   Sepsis with encephalopathy and septic shock (HCC)   Tachycardia   Diverticulosis   Sepsis present on admission -Antibiotic plan as below.  Currently hemodynamically stable  E. coli bacteremia -Questionable cause.  ID has signed off.  ID recommends 2 weeks of IV ceftazidime.  2D echo showed no vegetations.   -Repeat blood culture from 07/09/2018 is negative so far.  CT abdomen pelvis on 07/12/2018 shows diverticulosis without diverticulitis.  Acute metabolic/uremic encephalopathy -Initially there was a concern for meningitis but LP done on 07/10/2018 showed only 3 WBCs.  Hence patient does not have meningitis.  Acyclovir  and meningitis coverage were discontinued - Neurology has signed off; repeat CT of the head on 07/11/2018 showed no acute intracranial abnormality but showed chronic ischemic infarcts.  Patient cannot undergo MRI of the brain.  Keppra has been discontinued -Patient still slightly confused and slow to respond. -Diet as per SLP recommendations.  Acute kidney injury on chronic kidney disease stage IV -Baseline creatinine is around 2.5. -Creatinine 5.55 today.  Patient was started on dialysis on 07/09/2018.  Nephrology following.  Patient is receiving intermittent dialysis as per nephrology.  Wheezing -Questionable cause.  Continue prednisone 40 mg daily for 5 days total.  Continue Dulera.  HIV -Continue current antiretroviral regimen.  ID following  History of chronic systolic CHF with EF of 09%/NATF -Currently volume being managed by dialysis which was started on 07/09/2018.  Continue metoprolol.  Cardiology following.  Strict input and output.  Daily weights  Essential hypertension -Blood pressure stable for now.  Continue metoprolol.  Monitor.  New onset A. fib with RVR with intermittent episodes of NSVT -Currently rate controlled.  Patient was on heparin drip.  I discontinued the heparin drip on 07/11/2018 because of thrombocytopenia; cardiology following.  Continue metoprolol.  -We will start Coumadin once thrombocytopenia improves.   Diabetes mellitus type 2 -Continue Accu-Cheks with coverage.  Hemoglobin A1c was 6.5 on 05/22/2018  Thrombocytopenia -Questionable cause.  Improving.  No signs of bleeding.  Monitor   DVT prophylaxis: SCDs Code Status: DNR Family Communication: None at bedside Disposition Plan: Depends on clinical outcome  Consultants: Nephrology/neurology/ID/IR/cardiology  Procedures: IR guided HD catheter placement on  07/09/2018 LP on 07/10/2018 by IR  Antimicrobials:  Anti-infectives (From admission, onward)   Start     Dose/Rate Route Frequency Ordered Stop     07/12/18 2200  cefTAZidime (FORTAZ) 1 g in sodium chloride 0.9 % 100 mL IVPB     1 g 200 mL/hr over 30 Minutes Intravenous Every 24 hours 07/12/18 1449     07/10/18 1430  vancomycin (VANCOCIN) IVPB 1000 mg/200 mL premix  Status:  Discontinued     1,000 mg 200 mL/hr over 60 Minutes Intravenous Every 48 hours 07/08/18 1421 07/09/18 0959   07/10/18 1000  cefTRIAXone (ROCEPHIN) 2 g in sodium chloride 0.9 % 100 mL IVPB  Status:  Discontinued     2 g 200 mL/hr over 30 Minutes Intravenous Every 24 hours 07/10/18 0942 07/12/18 1433   07/10/18 0847  cefTRIAXone (ROCEPHIN) 2 g in sodium chloride 0.9 % 100 mL IVPB  Status:  Discontinued     2 g 200 mL/hr over 30 Minutes Intravenous Every 24 hours 07/09/18 1004 07/10/18 0933   07/09/18 1400  acyclovir (ZOVIRAX) 745 mg in dextrose 5 % 150 mL IVPB  Status:  Discontinued     10 mg/kg  74.5 kg 164.9 mL/hr over 60 Minutes Intravenous Every 24 hours 07/08/18 1421 07/09/18 0959   07/09/18 1300  bictegravir-emtricitabine-tenofovir AF (BIKTARVY) 50-200-25 MG per tablet 1 tablet     1 tablet Oral Daily 07/09/18 1012     07/08/18 2300  cefTRIAXone (ROCEPHIN) 2 g in sodium chloride 0.9 % 100 mL IVPB  Status:  Discontinued     2 g 200 mL/hr over 30 Minutes Intravenous Every 12 hours 07/08/18 1341 07/09/18 1004   07/08/18 1800  vancomycin (VANCOCIN) IVPB 1000 mg/200 mL premix  Status:  Discontinued     1,000 mg 200 mL/hr over 60 Minutes Intravenous Every 48 hours 07/06/18 2019 07/07/18 1137   07/08/18 1430  ampicillin (OMNIPEN) 2 g in sodium chloride 0.9 % 100 mL IVPB  Status:  Discontinued     2 g 300 mL/hr over 20 Minutes Intravenous Every 12 hours 07/08/18 1343 07/09/18 0959   07/08/18 1430  vancomycin (VANCOCIN) 1,500 mg in sodium chloride 0.9 % 500 mL IVPB     1,500 mg 250 mL/hr over 120 Minutes Intravenous  Once 07/08/18 1421 07/08/18 1723   07/08/18 1400  acyclovir (ZOVIRAX) 745 mg in dextrose 5 % 150 mL IVPB     10 mg/kg  74.5 kg 164.9 mL/hr over  60 Minutes Intravenous  Once 07/08/18 1345 07/09/18 1315   07/07/18 1800  ceFEPIme (MAXIPIME) 1 g in sodium chloride 0.9 % 100 mL IVPB  Status:  Discontinued     1 g 200 mL/hr over 30 Minutes Intravenous Every 24 hours 07/06/18 2019 07/07/18 1137   07/07/18 1145  cefTRIAXone (ROCEPHIN) 2 g in sodium chloride 0.9 % 100 mL IVPB  Status:  Discontinued     2 g 200 mL/hr over 30 Minutes Intravenous Every 24 hours 07/07/18 1137 07/08/18 1341   07/07/18 1000  bictegravir-emtricitabine-tenofovir AF (BIKTARVY) 50-200-25 MG per tablet 1 tablet  Status:  Discontinued     1 tablet Oral Daily 07/07/18 0058 07/07/18 1127   07/06/18 1900  ceFEPIme (MAXIPIME) 2 g in sodium chloride 0.9 % 100 mL IVPB     2 g 200 mL/hr over 30 Minutes Intravenous  Once 07/06/18 1859 07/06/18 2001   07/06/18 1900  vancomycin (VANCOCIN) IVPB 1000 mg/200 mL premix     1,000 mg 200 mL/hr over  60 Minutes Intravenous  Once 07/06/18 1859 07/06/18 2035         Subjective: Patient seen and examined at bedside.  He is sleepy, wakes up only slightly, does not answer much questions.  No overnight fever or vomiting. Objective: Vitals:   07/14/18 2121 07/15/18 0421 07/15/18 0656 07/15/18 0857  BP: 114/73 129/71    Pulse: 71 67    Resp: 16 18    Temp: 98.3 F (36.8 C) 98.1 F (36.7 C)    TempSrc: Oral Oral    SpO2: 96% 98%  97%  Weight:   73.6 kg   Height:        Intake/Output Summary (Last 24 hours) at 07/15/2018 0910 Last data filed at 07/15/2018 0700 Gross per 24 hour  Intake 965.59 ml  Output 300 ml  Net 665.59 ml   Filed Weights   07/13/18 1542 07/14/18 0528 07/15/18 0656  Weight: 74.2 kg 72.9 kg 73.6 kg    Examination:  General exam: No acute distress.  Sleepy, wakes up only slightly, does not answer much questions  respiratory system: Bilateral decreased breath sounds at bases with scattered crackles Cardiovascular system: S1-S2 heard, rate controlled Gastrointestinal system: Abdomen is nondistended, soft  and nontender. Normal bowel sounds heard. Extremities: No cyanosis; bilateral lower extremity edema   Data Reviewed: I have personally reviewed following labs and imaging studies  CBC: Recent Labs  Lab 07/09/18 0437  07/11/18 0425 07/12/18 0336 07/13/18 0337 07/14/18 0531 07/15/18 0530  WBC 16.0*   < > 4.5 2.4* 3.3* 4.3 4.3  NEUTROABS 14.9*  --   --   --  2.3 3.2 3.0  HGB 12.5*   < > 12.7* 13.5 13.2 12.7* 12.4*  HCT 39.8   < > 40.8 42.9 41.9 41.3 38.4*  MCV 90.0   < > 93.4 92.5 91.7 90.2 90.4  PLT 73*   < > 56* 51* 71* 68* 82*   < > = values in this interval not displayed.   Basic Metabolic Panel: Recent Labs  Lab 07/11/18 0235 07/12/18 0336 07/13/18 0337 07/14/18 0531 07/15/18 0530  NA 144 142 143 138 138  K 3.9 4.3 4.7 4.4 3.9  CL 107 108 109 104 104  CO2 26 25 24 23 27   GLUCOSE 114* 91 161* 157* 115*  BUN 73* 44* 72* 53* 77*  CREATININE 5.08* 4.10* 5.74* 4.43* 5.55*  CALCIUM 7.7* 7.3* 7.4* 7.1* 6.7*  MG 1.9 1.8 2.1 2.1  --    GFR: Estimated Creatinine Clearance: 11.8 mL/min (A) (by C-G formula based on SCr of 5.55 mg/dL (H)). Liver Function Tests: Recent Labs  Lab 07/09/18 0437  AST 57*  ALT 79*  ALKPHOS 78  BILITOT 0.8  PROT 5.9*  ALBUMIN 2.1*   No results for input(s): LIPASE, AMYLASE in the last 168 hours. Recent Labs  Lab 07/08/18 1204  AMMONIA 33   Coagulation Profile: No results for input(s): INR, PROTIME in the last 168 hours. Cardiac Enzymes: No results for input(s): CKTOTAL, CKMB, CKMBINDEX, TROPONINI in the last 168 hours. BNP (last 3 results) No results for input(s): PROBNP in the last 8760 hours. HbA1C: No results for input(s): HGBA1C in the last 72 hours. CBG: Recent Labs  Lab 07/14/18 1720 07/14/18 2119 07/15/18 0016 07/15/18 0418 07/15/18 0815  GLUCAP 165* 175* 125* 108* 89   Lipid Profile: No results for input(s): CHOL, HDL, LDLCALC, TRIG, CHOLHDL, LDLDIRECT in the last 72 hours. Thyroid Function Tests: No results for  input(s): TSH, T4TOTAL, FREET4, T3FREE,  THYROIDAB in the last 72 hours. Anemia Panel: No results for input(s): VITAMINB12, FOLATE, FERRITIN, TIBC, IRON, RETICCTPCT in the last 72 hours. Sepsis Labs: Recent Labs  Lab 07/08/18 1626 07/08/18 1952 07/09/18 0437 07/10/18 0317  PROCALCITON >150.00  --  >150.00 >150.00  LATICACIDVEN 1.6 1.5  --   --     Recent Results (from the past 240 hour(s))  Blood Culture (routine x 2)     Status: Abnormal   Collection Time: 07/06/18  7:07 PM  Result Value Ref Range Status   Specimen Description BLOOD RIGHT FOREARM  Final   Special Requests   Final    BOTTLES DRAWN AEROBIC AND ANAEROBIC Blood Culture adequate volume   Culture  Setup Time   Final    GRAM NEGATIVE RODS ANAEROBIC BOTTLE ONLY CRITICAL VALUE NOTED.  VALUE IS CONSISTENT WITH PREVIOUSLY REPORTED AND CALLED VALUE.    Culture (A)  Final    ESCHERICHIA COLI SUSCEPTIBILITIES PERFORMED ON PREVIOUS CULTURE WITHIN THE LAST 5 DAYS. Performed at [facility], [place] [address], [place], [place] [address]    Report Status 07/11/2018 FINAL  Final  Blood Culture (routine x 2)     Status: Abnormal   Collection Time: 07/06/18  7:40 PM  Result Value Ref Range Status   Specimen Description BLOOD LEFT ANTECUBITAL  Final   Special Requests   Final    BOTTLES DRAWN AEROBIC AND ANAEROBIC Blood Culture adequate volume   Culture  Setup Time   Final    GRAM NEGATIVE RODS IN BOTH AEROBIC AND ANAEROBIC BOTTLES CRITICAL RESULT CALLED TO, READ BACK BY AND VERIFIED WITHRonnald Nian [id number] FCP Performed at [facility], [place] [address], [place], [place] [address]    Culture ESCHERICHIA COLI (A)  Final   Report Status 07/09/2018 FINAL  Final   Organism ID, Bacteria ESCHERICHIA COLI  Final      Susceptibility   Escherichia coli - MIC*    AMPICILLIN >=32 RESISTANT Resistant     CEFAZOLIN 16 SENSITIVE Sensitive     CEFEPIME <=1 SENSITIVE Sensitive     CEFTAZIDIME <=1 SENSITIVE  Sensitive     CEFTRIAXONE <=1 SENSITIVE Sensitive     CIPROFLOXACIN <=0.25 SENSITIVE Sensitive     GENTAMICIN <=1 SENSITIVE Sensitive     IMIPENEM <=0.25 SENSITIVE Sensitive     TRIMETH/SULFA <=20 SENSITIVE Sensitive     AMPICILLIN/SULBACTAM >=32 RESISTANT Resistant     PIP/TAZO <=4 SENSITIVE Sensitive     Extended ESBL NEGATIVE Sensitive     * ESCHERICHIA COLI  Blood Culture ID Panel (Reflexed)     Status: Abnormal   Collection Time: 07/06/18  7:40 PM  Result Value Ref Range Status   Enterococcus species NOT DETECTED NOT DETECTED Final   Listeria monocytogenes NOT DETECTED NOT DETECTED Final   Staphylococcus species NOT DETECTED NOT DETECTED Final   Staphylococcus aureus (BCID) NOT DETECTED NOT DETECTED Final   Streptococcus species NOT DETECTED NOT DETECTED Final   Streptococcus agalactiae NOT DETECTED NOT DETECTED Final   Streptococcus pneumoniae NOT DETECTED NOT DETECTED Final   Streptococcus pyogenes NOT DETECTED NOT DETECTED Final   Acinetobacter baumannii NOT DETECTED NOT DETECTED Final   Enterobacteriaceae species DETECTED (A) NOT DETECTED Final    Comment: Enterobacteriaceae represent a large family of gram-negative bacteria, not a single organism. CRITICAL RESULT CALLED TO, READ BACK BY AND VERIFIED WITH: PHARMD MANCHIL, B [phone] FCP    Enterobacter cloacae complex NOT DETECTED NOT DETECTED Final  Escherichia coli DETECTED (A) NOT DETECTED Final    Comment: CRITICAL RESULT CALLED TO, READ BACK BY AND VERIFIED WITH: PHARMD MANCHIL, B [phone] FCP    Klebsiella oxytoca NOT DETECTED NOT DETECTED Final   Klebsiella pneumoniae NOT DETECTED NOT DETECTED Final   Proteus species NOT DETECTED NOT DETECTED Final   Serratia marcescens NOT DETECTED NOT DETECTED Final   Carbapenem resistance NOT DETECTED NOT DETECTED Final   Haemophilus influenzae NOT DETECTED NOT DETECTED Final   Neisseria meningitidis NOT DETECTED NOT DETECTED Final   Pseudomonas aeruginosa NOT  DETECTED NOT DETECTED Final   Candida albicans NOT DETECTED NOT DETECTED Final   Candida glabrata NOT DETECTED NOT DETECTED Final   Candida krusei NOT DETECTED NOT DETECTED Final   Candida parapsilosis NOT DETECTED NOT DETECTED Final   Candida tropicalis NOT DETECTED NOT DETECTED Final    Comment: Performed at [facility], [place] [address] [place], [place] [address]  MRSA PCR Screening     Status: None   Collection Time: 07/07/18  3:24 PM  Result Value Ref Range Status   MRSA by PCR NEGATIVE NEGATIVE Final    Comment:        The GeneXpert MRSA Assay (FDA approved for NASAL specimens only), is one component of a comprehensive MRSA colonization surveillance program. It is not intended to diagnose MRSA infection nor to guide or monitor treatment for MRSA infections. Performed at [facility], [place] [address] [place], [place] [address]   Culture, Urine     Status: None   Collection Time: 07/08/18  7:47 AM  Result Value Ref Range Status   Specimen Description URINE, RANDOM  Final   Special Requests NONE  Final   Culture   Final    NO GROWTH Performed at [facility], [place] [address] [place], [place] [address]    Report Status 07/09/2018 FINAL  Final  Culture, blood (Routine X 2) w Reflex to ID Panel     Status: None   Collection Time: 07/09/18 10:18 AM  Result Value Ref Range Status   Specimen Description BLOOD RIGHT HAND  Final   Special Requests   Final    BOTTLES DRAWN AEROBIC AND ANAEROBIC Blood Culture adequate volume   Culture   Final    NO GROWTH 5 DAYS Performed at [facility], [place] [address] [place], [place] [address]    Report Status 07/14/2018 FINAL  Final  Culture, blood (Routine X 2) w Reflex to ID Panel     Status: None   Collection Time: 07/09/18 10:25 AM  Result Value Ref Range Status   Specimen Description BLOOD LEFT HAND  Final   Special Requests   Final    BOTTLES DRAWN AEROBIC AND ANAEROBIC Blood Culture adequate volume    Culture   Final    NO GROWTH 5 DAYS Performed at [facility], [place] [address] [place], [place] [address]    Report Status 07/14/2018 FINAL  Final  C difficile quick scan w PCR reflex     Status: Abnormal   Collection Time: 07/09/18 10:38 AM  Result Value Ref Range Status   C Diff antigen POSITIVE (A) NEGATIVE Final   C Diff toxin NEGATIVE NEGATIVE Final   C Diff interpretation Results are indeterminate. See PCR results.  Final    Comment: Performed at [facility], [place] [address], [place], [place] [address]  C. Diff by PCR, Reflexed     Status:  Abnormal   Collection Time: 07/09/18 10:38 AM  Result Value Ref Range Status   Toxigenic C. Difficile by PCR POSITIVE (A) NEGATIVE Final    Comment: Positive for toxigenic C. difficile with little to no toxin production. Only treat if clinical presentation suggests symptomatic illness. Performed at [facility], [place] [address] [place], [place] [address]   CSF culture     Status: None   Collection Time: 07/10/18  2:44 PM  Result Value Ref Range Status   Specimen Description CSF  Final   Special Requests NONE  Final   Gram Stain   Final    WBC PRESENT,BOTH PMN AND MONONUCLEAR NO ORGANISMS SEEN CYTOSPIN SMEAR    Culture   Final    NO GROWTH 3 DAYS Performed at [facility], [place] [address], [place], [place] [address]    Report Status 07/13/2018 FINAL  Final  Fungus Culture With Stain     Status: None (Preliminary result)   Collection Time: 07/10/18  2:44 PM  Result Value Ref Range Status   Fungus Stain Final report  Final    Comment: (NOTE) Performed At: [facility] [place], [place] [id number] Rush Farmer MD TO:[id number]    Fungus (Mycology) Culture PENDING  Incomplete   Fungal Source CSF  Final  Fungus Culture Result     Status: None   Collection Time: 07/10/18  2:44 PM  Result Value Ref Range Status   Result 1 Comment  Final    Comment: (NOTE) KOH/Calcofluor preparation:  no  fungus observed. Performed At: [facility] [place], [place] [id number] Rush Farmer MD ZJ:[id number]          Radiology Studies: Dg Swallowing Func-speech Pathology  Result Date: 07/14/2018 Objective Swallowing Evaluation: Type of Study: MBS-Modified Barium Swallow Study  Patient Details Name: BUDD FREIERMUTH MRN: [id number] Date of Birth: [birth year] Today's Date: 07/14/2018 Time: SLP Start Time (ACUTE ONLY): 68 -SLP Stop Time (ACUTE ONLY): 1150 SLP Time Calculation (min) (ACUTE ONLY): 20 min Past Medical History: Past Medical History: Diagnosis Date . AICD (automatic cardioverter/defibrillator) present  . Alcoholism in remission ([place])  . Anemia  . Aortic insufficiency  . Arthritis  . At moderate risk for fall  . Bell's palsy  . Chronic systolic heart failure (HCC)   NYHA Class III . CKD (chronic kidney disease), stage IV ([place])  . COPD (chronic obstructive pulmonary disease) ([place])  . Essential hypertension, benign  . HIV disease ([place]) 09/06/2016 . Hyperlipidemia  . NICM (nonischemic cardiomyopathy) ([place])  . Noncompliance with medication regimen  . NSVT (nonsustained ventricular tachycardia) ([place])  . Numbness of right jaw   Had a stoke in 01/2013. Numbness is occasional, especially when trying to chew. . OSA (obstructive sleep apnea)  . Productive cough 08/2013  With brown sputum  . S/P aortic valve replacement with bioprosthetic valve  . Stroke ([place]) 01/2013  weakness of right side from CVA . Type 2 diabetes mellitus ([place])  . Uses hearing aid 2014  recently received new hearing aids Past Surgical History: Past Surgical History: Procedure Laterality Date . BIOPSY N/A 04/10/2014  Procedure: GASTRIC BIOPSY;  Surgeon: Daneil Dolin, MD;  Location: [facility] ORS;  Service: Endoscopy;  Laterality: N/A; . BIOPSY  10/12/2015  Procedure: BIOPSY;  Surgeon: Daneil Dolin, MD;  Location: [facility] ENDO SUITE;  Service: Endoscopy;;  stomach bx's . CARDIAC DEFIBRILLATOR PLACEMENT  11/12/12  [employer]  Inogen MINI ICD implanted in [place] at [facility] .  CARDIAC SURGERY    bioprostetic AVR per Dr Nelly Laurence note . COLONOSCOPY WITH PROPOFOL N/A 04/10/2014  RMR: Colonic Diverticulosis . ESOPHAGOGASTRODUODENOSCOPY (EGD) WITH PROPOFOL N/A 04/10/2014  RMR: Mild erosive reflux esophagitis. Multiple antral polyps likely hyperplastic status post removal by hot snare cautery technique. Diffusely abnormal stomach status post gastric biopsy I suspect some of patients anemaia may be due to intermittent oozing from the stomach. It would be difficult and a risky proposition to attempt complete removal of all of his gastric polyps. . ESOPHAGOGASTRODUODENOSCOPY (EGD) WITH PROPOFOL N/A 10/12/2015  Procedure: ESOPHAGOGASTRODUODENOSCOPY (EGD) WITH PROPOFOL;  Surgeon: Daneil Dolin, MD;  Location: [facility] END[facility];  Service: Endoscopy;  Laterality: N/A;  6767 - moved to 9:15 . IR FLUORO GUIDE CV LINE RIGHT  07/09/2018 . IR US GUIDE VASC ACCESS RIGHT  07/09/2018 . POLYPECTOMY N/A 04/10/2014  Procedure: GASTRIC POLYPECTOMY;  Surgeon: Daneil Dolin, MD;  Location: [facility];  Service: Endoscopy;  Laterality: N/A; . RIGHT HEART CATHETERIZATION N/A 02/06/2014  Procedure: RIGHT HEART CATH;  Surgeon: Larey Dresser, MD;  Location: [facility] C[facility] LAB;  Service: Cardiovascular;  Laterality: N/A; HPI: SHAQUON GROPP is a 68 y.o. male with PMH significant of HIV, type 2 diabetes, COPD, CVA ([address], HTN, Bells Palsy, systolic CHF, CKD, and history of AICD placement followed by cardiology.  Pt was admitted to [facility] ED 07/07/18 and after presenting with AMS at SNF residence, vomitted, and became increasingly unresponsive. Per PA note, pt was found to be encephalopathic secondary to sepsis and renal failure. Pt seen by ST in Jan 2018; BSE recc D2/thin. CXR showed cardiomegaly and small left pleural effusion. Head CT revealed new age indeterminate RIGHT basal ganglia small infarcts, old RIGHT temporal occipital/PCA territory infarct, old basal ganglia,  thalami, pontine and cerebellar small infarcts.  Subjective: alert, cooperative Assessment / Plan / Recommendation CHL IP CLINICAL IMPRESSIONS 07/14/2018 Clinical Impression Pt presents with primary moderate oral dysphagia, mild pharyngeal dysphagia. There is lingual pumping, decreased bolus cohesion, piecemeal deglutition and premature spillage (to pyriforms with liquids, valleculae with solids). Premature spillage results in intermittent deep larygneal penetration of thin, nectar, and honey thick liquids, with aspiration (sensed and silent) of thin and nectar. Due to impulsivity pt at risk for aspiration with liquids of any consistency. With solids, pt without rotary chew, lingual thrusting noted with poor cohesion and spillage of solids to the valleculae. SLP used straw to facilitate chin tuck, which prevented aspiration with thin liquids, including small and large sips and consecutive boluses. There was trace shallow penetration with chin tuck which reversed with swallow. Brief stasis of barium tablet in mid-thoracic esophagus, cleared pt's GE junction with liquid wash. Recommend dys 2, thin liquids, meds whole in puree; pt must tuck chin when swallowing for airway protection. Recommend full supervision due to level of cuing necessary. SLP will follow for tolerance and training in swallow precautions/compensations.  SLP Visit Diagnosis Dysphagia, oral phase (R13.11);Dysphagia, pharyngeal phase (R13.13) Attention and concentration deficit following -- Frontal lobe and executive function deficit following -- Impact on safety and function Moderate aspiration risk   CHL IP TREATMENT RECOMMENDATION 07/14/2018 Treatment Recommendations Therapy as outlined in treatment plan below   Prognosis 07/14/2018 Prognosis for Safe Diet Advancement Good Barriers to Reach Goals Cognitive deficits Barriers/Prognosis Comment -- CHL IP DIET RECOMMENDATION 07/14/2018 SLP Diet Recommendations Dysphagia 2 (Fine chop) solids;Thin liquid  Liquid Administration via Straw Medication Administration Whole meds with puree Compensations Slow rate;Small sips/bites;Chin tuck;Clear throat intermittently;Use straw to facilitate chin tuck Postural  Changes --   CHL IP OTHER RECOMMENDATIONS 07/14/2018 Recommended Consults -- Oral Care Recommendations Oral care BID Other Recommendations --   CHL IP FOLLOW UP RECOMMENDATIONS 07/14/2018 Follow up Recommendations Skilled Nursing facility   [facility] IP FREQUENCY AND DURATION 07/14/2018 Speech Therapy Frequency (ACUTE ONLY) min 2x/week Treatment Duration 2 weeks      CHL IP ORAL PHASE 07/14/2018 Oral Phase Impaired Oral - Pudding Teaspoon -- Oral - Pudding Cup -- Oral - Honey Teaspoon NT Oral - Honey Cup Weak lingual manipulation;Lingual pumping;Reduced posterior propulsion;Piecemeal swallowing;Decreased bolus cohesion;Premature spillage Oral - Nectar Teaspoon Weak lingual manipulation;Lingual pumping;Reduced posterior propulsion;Piecemeal swallowing;Decreased bolus cohesion;Premature spillage Oral - Nectar Cup Weak lingual manipulation;Lingual pumping;Reduced posterior propulsion;Piecemeal swallowing;Decreased bolus cohesion;Premature spillage Oral - Nectar Straw Weak lingual manipulation;Lingual pumping;Reduced posterior propulsion;Piecemeal swallowing;Delayed oral transit;Decreased bolus cohesion Oral - Thin Teaspoon Lingual pumping;Reduced posterior propulsion;Piecemeal swallowing;Decreased bolus cohesion;Premature spillage Oral - Thin Cup Weak lingual manipulation;Lingual pumping;Reduced posterior propulsion;Piecemeal swallowing;Decreased bolus cohesion;Premature spillage Oral - Thin Straw Weak lingual manipulation;Reduced posterior propulsion;Piecemeal swallowing;Decreased bolus cohesion;Premature spillage Oral - Puree Weak lingual manipulation;Lingual pumping;Reduced posterior propulsion;Piecemeal swallowing;Decreased bolus cohesion;Premature spillage Oral - Mech Soft Impaired mastication;Weak lingual  manipulation;Lingual pumping;Reduced posterior propulsion;Lingual/palatal residue;Piecemeal swallowing;Delayed oral transit;Premature spillage;Decreased bolus cohesion Oral - Regular -- Oral - Multi-Consistency -- Oral - Pill Other (Comment) Oral Phase - Comment --  CHL IP PHARYNGEAL PHASE 07/14/2018 Pharyngeal Phase Impaired Pharyngeal- Pudding Teaspoon -- Pharyngeal -- Pharyngeal- Pudding Cup -- Pharyngeal -- Pharyngeal- Honey Teaspoon -- Pharyngeal -- Pharyngeal- Honey Cup Penetration/Aspiration before swallow;Penetration/Aspiration during swallow Pharyngeal Material enters airway, remains ABOVE vocal cords then ejected out;Material enters airway, remains ABOVE vocal cords and not ejected out Pharyngeal- Nectar Teaspoon -- Pharyngeal -- Pharyngeal- Nectar Cup Penetration/Aspiration before swallow;Penetration/Aspiration during swallow;Trace aspiration Pharyngeal Material does not enter airway;Material enters airway, CONTACTS cords and not ejected out;Material enters airway, passes BELOW cords without attempt by patient to eject out (silent aspiration) Pharyngeal- Nectar Straw Penetration/Aspiration before swallow;Penetration/Aspiration during swallow;Trace aspiration Pharyngeal Material enters airway, CONTACTS cords and not ejected out;Material enters airway, passes BELOW cords without attempt by patient to eject out (silent aspiration) Pharyngeal- Thin Teaspoon Penetration/Aspiration before swallow;Penetration/Aspiration during swallow;Moderate aspiration Pharyngeal Material enters airway, passes BELOW cords and not ejected out despite cough attempt by patient Pharyngeal- Thin Cup Penetration/Aspiration before swallow;Penetration/Aspiration during swallow;Moderate aspiration Pharyngeal Material does not enter airway;Material enters airway, CONTACTS cords and not ejected out;Material enters airway, passes BELOW cords and not ejected out despite cough attempt by patient;Material enters airway, passes BELOW cords  without attempt by patient to eject out (silent aspiration) Pharyngeal- Thin Straw Penetration/Aspiration during swallow;Other (Comment) Pharyngeal Material does not enter airway;Material enters airway, remains ABOVE vocal cords then ejected out Pharyngeal- Puree WFL Pharyngeal -- Pharyngeal- Mechanical Soft WFL Pharyngeal -- Pharyngeal- Regular -- Pharyngeal -- Pharyngeal- Multi-consistency -- Pharyngeal -- Pharyngeal- Pill Other (Comment) Pharyngeal -- Pharyngeal Comment --  CHL IP CERVICAL ESOPHAGEAL PHASE 07/14/2018 Cervical Esophageal Phase WFL Pudding Teaspoon -- Pudding Cup -- Honey Teaspoon -- Honey Cup -- Nectar Teaspoon -- Nectar Cup -- Nectar Straw -- Thin Teaspoon -- Thin Cup -- Thin Straw -- Puree -- Mechanical Soft -- Regular -- Multi-consistency -- Pill -- Cervical Esophageal Comment -- Deneise Lever, MS, CCC-SLP Speech-Language Pathologist Acute Rehabilitation Services Pager: [phone] Office: [phone] Aliene Altes 07/14/2018, 1:46 PM                   Scheduled Meds: . bictegravir-emtricitabine-tenofovir AF  1 tablet Oral Daily  . chlorhexidine  15 mL Mouth  Rinse BID  . Chlorhexidine Gluconate Cloth  6 each Topical Q0600  . Chlorhexidine Gluconate Cloth  6 each Topical Q0600  . Chlorhexidine Gluconate Cloth  6 each Topical Q0600  . insulin aspart  0-9 Units Subcutaneous Q4H  . mouth rinse  15 mL Mouth Rinse q12n4p  . metoprolol succinate  25 mg Oral Daily  . mometasone-formoterol  2 puff Inhalation BID  . predniSONE  40 mg Oral Q breakfast   Continuous Infusions: . sodium chloride Stopped (07/15/18 0106)  . cefTAZidime (FORTAZ)  IV Stopped (07/14/18 2216)     LOS: 8 days        Aline August, MD [facility] Pager [phone]  If 7PM-7AM, please contact night-coverage [email or web address] Password [email or web address] 07/15/2018, 9:10 AM

## 2018-07-15 NOTE — Progress Notes (Signed)
Patient more restless tonight than he has been last two nights. This shift HD cath drsg. was off patient and found on floor, IV was found removed, patient has pulled tele leads off, and patient was sitting at side of bed, stating he had to go to the bathroom when rectal pouch and condom cath were in place. Patient educated several times. Patient asked for water. This RN let him drink water while reminding him to take small sips and tuck chin. HOB elevated greater than 45 degrees. Patient coughing after drinks, showing signs of being unable to breathe at one time. Patient and Janett Billow, NT notified of not being able to drink any more. Will continue to monitor.

## 2018-07-15 NOTE — Progress Notes (Signed)
  Speech Language Pathology Treatment: Dysphagia  Patient Details Name: Keith Young MRN: [id number] DOB: 1951/01/30 Today's Date: 07/15/2018 Time: 0340-3524 SLP Time Calculation (min) (ACUTE ONLY): 19 min  Assessment / Plan / Recommendation Clinical Impression  SLP followed up for reassessment; pt NPO per nursing after wheezing, coughing with thin liquids even with use of strategies. Note pt aspirated both thin and nectar without chin tuck on MBS. SLP administered diagnostic trials of puree, honey thick liquids (initially by teaspoon, eventually by cup allowing pt control of cup). Pt with some wheezing upon exertion, even prior to presentation of POs. There were no overt signs of aspiration, even when pt self-fed boluses of puree and larger cup sips of honey-thick liquids. Pt required less cuing for small sips and to swallow prior to taking additional bites/sips (occasional min-mod A). Recommend dysphagia 2, honey-thick liquids for now, meds whole in puree. SLP to follow for further training and diagnostic treatment with chin tuck to determine if pt able to implement this strategy for adequate airway protection in order to advance liquids. Discussed with RN.      HPI HPI: Keith Young is a 68 y.o. male with PMH significant of HIV, type 2 diabetes, COPD, CVA (81859, HTN, Bells Palsy, systolic CHF, CKD, and history of AICD placement followed by cardiology.  Pt was [facility] to [facility] ED [facility]07/[facility] and after presenting with AMS at SNF residence, vomitted, and became increasingly unresponsive. Per PA note, pt was found to be encephalopathic secondary to sepsis and renal failure. Pt seen by ST in Jan 2018; BSE recc D2/thin. CXR showed cardiomegaly and small left pleural effusion. Head CT revealed new age indeterminate RIGHT basal ganglia small infarcts, old RIGHT temporal occipital/PCA territory infarct, old basal ganglia, thalami, pontine and cerebellar small infarcts.      SLP Plan  Other  (Comment)(downgrade to D2/honey)       Recommendations  Diet recommendations: Dysphagia 2 (fine chop);Honey-thick liquid Liquids provided via: Cup Medication Administration: Whole meds with puree Supervision: Full supervision/cueing for compensatory strategies Compensations: Slow rate;Small sips/bites;Other (Comment);Clear throat intermittently(don't need to use chin tuck with honey) Postural Changes and/or Swallow Maneuvers: Seated upright 90 degrees                Oral Care Recommendations: Oral care BID Follow up Recommendations: Skilled Nursing facility SLP Visit Diagnosis: Dysphagia, oral phase (R13.11);Dysphagia, pharyngeal phase (R13.13) Plan: Other (Comment)(downgrade to D2/honey)       [place], [place], [place] Pathologist Acute Rehabilitation Services Pager: [phone] Office: [phone]   Aliene Altes 07/15/2018, 10:06 AM

## 2018-07-15 NOTE — Progress Notes (Signed)
[name]   SUBJECTIVE:  No particular complaints this AM. Had HD Friday UOP 300 yesterday.   OBJECTIVE:  Vitals:   07/15/18 0421 07/15/18 0857  BP: 129/71   Pulse: 67   Resp: 18   Temp: 98.1 F (36.7 C)   SpO2: 98% 97%    Intake/Output Summary (Last 24 hours) at 07/15/2018 1026 Last data filed at 07/15/2018 0700 Gross per 24 hour  Intake 745.59 ml  Output 300 ml  Net 445.59 ml      Genearl: comfortably laying at 30 deg, arouses to voice, localizes, answers simple questions. HEENT: MMM [place] AT anicteric sclera Neck:  RIJ temp cath c/d/i CV:  Heart RRR  Lungs:  L/S CTA bilaterally Abd:  abd SNT/ND with normal BS GU:  Bladder non-palpable, Foley catheter in place Extremities:  No LE edema. Skin:  No skin rash Neuro: oriented to self only, [name], rest home, [name]  MEDICATIONS:  . bictegravir-emtricitabine-tenofovir AF  1 tablet Oral Daily  . chlorhexidine  15 mL Mouth Rinse BID  . Chlorhexidine Gluconate Cloth  6 each Topical Q0600  . Chlorhexidine Gluconate Cloth  6 each Topical Q0600  . Chlorhexidine Gluconate Cloth  6 each Topical Q0600  . insulin aspart  0-9 Units Subcutaneous Q4H  . mouth rinse  15 mL Mouth Rinse q12n4p  . metoprolol succinate  25 mg Oral Daily  . mometasone-formoterol  2 puff Inhalation BID  . predniSONE  40 mg Oral Q breakfast       LABS:   CBC Latest Ref Rng & Units 07/15/2018 07/14/2018 07/13/2018  WBC 4.0 - 10.5 K/uL 4.3 4.3 3.3(L)  Hemoglobin 13.0 - 17.0 g/dL 12.4(L) 12.7(L) 13.2  Hematocrit 39.0 - 52.0 % 38.4(L) 41.3 41.9  Platelets 150 - 400 K/uL 82(L) 68(L) 71(L)    CMP Latest Ref Rng & Units 07/15/2018 07/14/2018 07/13/2018  Glucose 70 - 99 mg/dL 115(H) 157(H) 161(H)  BUN 8 - 23 mg/dL 77(H) 53(H) 72(H)  Creatinine 0.61 - 1.24 mg/dL 5.55(H) 4.43(H) 5.74(H)  Sodium 135 - 145 mmol/L 138 138 143  Potassium 3.5 - 5.1 mmol/L 3.9 4.4 4.7  Chloride 98 - 111  mmol/L 104 104 109  CO2 22 - 32 mmol/L 27 23 24   Calcium 8.9 - 10.3 mg/dL 6.7(L) 7.1(L) 7.4(L)  Total Protein 6.5 - 8.1 g/dL - - -  Total Bilirubin 0.3 - 1.2 mg/dL - - -  Alkaline Phos 38 - 126 U/L - - -  AST 15 - 41 U/L - - -  ALT 0 - 44 U/L - - -    Lab Results  Component Value Date   PTH 52 04/17/2018   CALCIUM 6.7 (L) 07/15/2018   CAION 1.27 (H) 11/28/2015   PHOS 3.4 04/17/2018       Component Value Date/Time   COLORURINE AMBER (A) 07/08/2018 0839   APPEARANCEUR CLOUDY (A) 07/08/2018 0839   LABSPEC 1.016 07/08/2018 0839   PHURINE 5.0 07/08/2018 0839   GLUCOSEU NEGATIVE 07/08/2018 0839   HGBUR SMALL (A) 07/08/2018 0839   BILIRUBINUR NEGATIVE 07/08/2018 0839   KETONESUR 5 (A) 07/08/2018 0839   PROTEINUR 100 (A) 07/08/2018 0839   UROBILINOGEN 0.2 10/23/2013 1545   NITRITE NEGATIVE 07/08/2018 0839   LEUKOCYTESUR LARGE (A) 07/08/2018 0839      Component Value Date/Time   PHART 7.346 (L) 07/08/2018 1235   PCO2ART 32.0 07/08/2018 1235   PO2ART 86.4 07/08/2018 1235   HCO3 17.0 (  L) 07/08/2018 1235   TCO2 25 11/28/2015 1304   ACIDBASEDEF 7.5 (H) 07/08/2018 1235   O2SAT 95.9 07/08/2018 1235       Component Value Date/Time   IRON 40 (L) 04/17/2018 0734   TIBC 260 04/17/2018 0734   FERRITIN 39 04/17/2018 0734   IRONPCTSAT 15 (L) 04/17/2018 0734       ASSESSMENT/PLAN:      1.  Chronic kidney disease stage IV with a baseline serum creatinine around 2.4-2.8.  I suspect his chronic kidney disease on the basis of longstanding hypertension, diabetes, atherosclerotic cardiovascular disease, and possibly HIV.  2.  Acute kidney injury.  Presumed ATN in setting of sepsis.  Dialysis was initiated on 2/10 for uremia causing/contributing to AMS.  At this point he has been requiring regularly. Continue to monitor for recovery of renal function.  I think there is a reasonable chance for recovery here.  Has temp cath currently.  Day to day assessment for HD.  If no recovery is noted  into next week will need tunneled cath.   3.  Sepsis secondary to E coli in urine and blood.  Continue antibiotics per ID - plan 2 wk course.  TEE Ok.  CT A/P showed diverticula.   4.  Metabolic encephalopathy.  Likely secondary to sepsis complicated by uremia.  LP not c/w infection.  Improving daily, slow process though.   5.  Hyperkalemia.  resolved  6.  Bioprosthetic AVR, Chronic dual HF with EF , A fib with RVR:  Cardiology following.  Anticoagulation on hold in light of thrombocytopenia, rec start when able (deferred coumadin v eliquis to nephrology; I recommend coumadin when able over eliquis)  7. HIV: on biktarvy   [name] MD

## 2018-07-15 NOTE — Progress Notes (Addendum)
Pharmacy Antibiotic Note  Keith Young is a 68 y.o. male admitted on 07/06/2018 with E Coli bacteremia.  Pharmacy has been consulted for ceftazidime dosing. Patient has baseline CKD Stage IV with baseline SCr 2.4 and has had AKI on CKD on this admission with SCr 5.55 today. He has been receiving intermittent hemodialysis with last on 2/14.   Patient continues on ceftazidime for ease of administration with dialysis if no renal recovery. OPAT orders have been placed with ceftazidime to end on 2/23 (2 weeks from 1st negative blood culture on 2/10). WBC WNL and patient afebrile.  Plan: Continue Ceftazidime 1 gm every 24 hours while dialysis schedule uncertain F/U HD plans, clinical status   Height: 5\' 3"  (160 cm) Weight: 162 lb 4.1 oz (73.6 kg) IBW/kg (Calculated) : 56.9  Temp (24hrs), Avg:98 F (36.7 C), Min:97.6 F (36.4 C), Max:98.3 F (36.8 C)  Recent Labs  Lab 07/08/18 1626 07/08/18 1952  07/11/18 0235 07/11/18 0425 07/12/18 0336 07/13/18 0337 07/14/18 0531 07/15/18 0530  WBC  --   --    < >  --  4.5 2.4* 3.3* 4.3 4.3  CREATININE 5.12*  --    < > 5.08*  --  4.10* 5.74* 4.43* 5.55*  LATICACIDVEN 1.6 1.5  --   --   --   --   --   --   --    < > = values in this interval not displayed.    Estimated Creatinine Clearance: 11.8 mL/min (A) (by C-G formula based on SCr of 5.55 mg/dL (H)).    No Known Allergies  Antimicrobials this admission: 2/7 Cefepime X 1 2/8 Ceftriaxone>>2/13 2/13 Ceftaz>>(2/23)   Microbiology results: 2/7 BCx: E. Coli, R to amp, unasyn 2/10 BCx: NGTD  Thank you for allowing pharmacy to be a part of this patient's care.  Jackson Latino, PharmD PGY1 Pharmacy Resident Phone [phone] 07/15/2018     10:25 AM

## 2018-07-16 DIAGNOSIS — I480 Paroxysmal atrial fibrillation: Secondary | ICD-10-CM

## 2018-07-16 LAB — CBC WITH DIFFERENTIAL/PLATELET
Abs Immature Granulocytes: 0.08 10*3/uL — ABNORMAL HIGH (ref 0.00–0.07)
Basophils Absolute: 0 10*3/uL (ref 0.0–0.1)
Basophils Relative: 0 %
Eosinophils Absolute: 0 10*3/uL (ref 0.0–0.5)
Eosinophils Relative: 0 %
HCT: 35.4 % — ABNORMAL LOW (ref 39.0–52.0)
Hemoglobin: 11.3 g/dL — ABNORMAL LOW (ref 13.0–17.0)
IMMATURE GRANULOCYTES: 3 %
Lymphocytes Relative: 14 %
Lymphs Abs: 0.4 10*3/uL — ABNORMAL LOW (ref 0.7–4.0)
MCH: 28.8 pg (ref 26.0–34.0)
MCHC: 31.9 g/dL (ref 30.0–36.0)
MCV: 90.1 fL (ref 80.0–100.0)
Monocytes Absolute: 0.4 10*3/uL (ref 0.1–1.0)
Monocytes Relative: 13 %
Neutro Abs: 2 10*3/uL (ref 1.7–7.7)
Neutrophils Relative %: 70 %
PLATELETS: 89 10*3/uL — AB (ref 150–400)
RBC: 3.93 MIL/uL — ABNORMAL LOW (ref 4.22–5.81)
RDW: 14.3 % (ref 11.5–15.5)
WBC: 2.8 10*3/uL — ABNORMAL LOW (ref 4.0–10.5)
nRBC: 0 % (ref 0.0–0.2)

## 2018-07-16 LAB — COMPREHENSIVE METABOLIC PANEL
ALT: 36 U/L (ref 0–44)
AST: 31 U/L (ref 15–41)
Albumin: 2 g/dL — ABNORMAL LOW (ref 3.5–5.0)
Alkaline Phosphatase: 62 U/L (ref 38–126)
Anion gap: 13 (ref 5–15)
BUN: 87 mg/dL — ABNORMAL HIGH (ref 8–23)
CHLORIDE: 104 mmol/L (ref 98–111)
CO2: 22 mmol/L (ref 22–32)
Calcium: 6.4 mg/dL — CL (ref 8.9–10.3)
Creatinine, Ser: 5.94 mg/dL — ABNORMAL HIGH (ref 0.61–1.24)
GFR calc Af Amer: 10 mL/min — ABNORMAL LOW (ref >60)
GFR calc non Af Amer: 9 mL/min — ABNORMAL LOW (ref >60)
Glucose, Bld: 135 mg/dL — ABNORMAL HIGH (ref 70–99)
Potassium: 3.7 mmol/L (ref 3.5–5.1)
Sodium: 139 mmol/L (ref 135–145)
Total Bilirubin: 0.5 mg/dL (ref 0.3–1.2)
Total Protein: 5.5 g/dL — ABNORMAL LOW (ref 6.5–8.1)

## 2018-07-16 LAB — GLUCOSE, CAPILLARY
GLUCOSE-CAPILLARY: 91 mg/dL (ref 70–99)
GLUCOSE-CAPILLARY: 170 mg/dL — AB (ref 70–99)
Glucose-Capillary: 111 mg/dL — ABNORMAL HIGH (ref 70–99)
Glucose-Capillary: 165 mg/dL — ABNORMAL HIGH (ref 70–99)
Glucose-Capillary: 165 mg/dL — ABNORMAL HIGH (ref 70–99)
Glucose-Capillary: 249 mg/dL — ABNORMAL HIGH (ref 70–99)
Glucose-Capillary: 251 mg/dL — ABNORMAL HIGH (ref 70–99)

## 2018-07-16 LAB — MAGNESIUM: Magnesium: 2.2 mg/dL (ref 1.7–2.4)

## 2018-07-16 NOTE — Progress Notes (Signed)
[facility]   SUBJECTIVE:    SCr increased over weekend  UOP not recorded  NO AM weight  K and HCO3 stable.   No c/o   OBJECTIVE:  Vitals:   07/16/18 0448 07/16/18 0756  BP: 119/73   Pulse: 61 76  Resp: 18 18  Temp: (!) 97.5 F (36.4 C)   SpO2: 97% 97%    Intake/Output Summary (Last 24 hours) at 07/16/2018 1142 Last data filed at 07/16/2018 0947 Gross per 24 hour  Intake 480 ml  Output -  Net 480 ml      Genearl: comfortably laying at 30 deg, arouses to voice, localizes, answers simple questions. Neck:  RIJ temp cath c/d/i CV:  RRR  No rub Lungs CTAB Abd:  abd SNT/ND with normal BS GU:  Bladder non-palpable, Foley catheter in place Extremities:  No LE edema. Skin:  No skin rash   MEDICATIONS:  . bictegravir-emtricitabine-tenofovir AF  1 tablet Oral Daily  . chlorhexidine  15 mL Mouth Rinse BID  . Chlorhexidine Gluconate Cloth  6 each Topical Q0600  . insulin aspart  0-9 Units Subcutaneous Q4H  . mouth rinse  15 mL Mouth Rinse q12n4p  . metoprolol succinate  25 mg Oral Daily  . mometasone-formoterol  2 puff Inhalation BID  . predniSONE  40 mg Oral Q breakfast       LABS:   CBC Latest Ref Rng & Units 07/16/2018 07/15/2018 07/14/2018  WBC 4.0 - 10.5 K/uL 2.8(L) 4.3 4.3  Hemoglobin 13.0 - 17.0 g/dL 11.3(L) 12.4(L) 12.7(L)  Hematocrit 39.0 - 52.0 % 35.4(L) 38.4(L) 41.3  Platelets 150 - 400 K/uL 89(L) 82(L) 68(L)    CMP Latest Ref Rng & Units 07/16/2018 07/15/2018 07/14/2018  Glucose 70 - 99 mg/dL 135(H) 115(H) 157(H)  BUN 8 - 23 mg/dL 87(H) 77(H) 53(H)  Creatinine 0.61 - 1.24 mg/dL 5.94(H) 5.55(H) 4.43(H)  Sodium 135 - 145 mmol/L 139 138 138  Potassium 3.5 - 5.1 mmol/L 3.7 3.9 4.4  Chloride 98 - 111 mmol/L 104 104 104  CO2 22 - 32 mmol/L 22 27 23   Calcium 8.9 - 10.3 mg/dL 6.4(LL) 6.7(L) 7.1(L)  Total Protein 6.5 - 8.1 g/dL 5.5(L) - -  Total Bilirubin 0.3 - 1.2 mg/dL 0.5 - -  Alkaline Phos 38 - 126 U/L 62 - -  AST 15 - 41 U/L 31 - -   ALT 0 - 44 U/L 36 - -    Lab Results  Component Value Date   PTH 52 04/17/2018   CALCIUM 6.4 (LL) 07/16/2018   CAION 1.27 (H) 11/28/2015   PHOS 3.4 04/17/2018       Component Value Date/Time   COLORURINE AMBER (A) 07/08/2018 0839   APPEARANCEUR CLOUDY (A) 07/08/2018 0839   LABSPEC 1.016 07/08/2018 0839   PHURINE 5.0 07/08/2018 0839   GLUCOSEU NEGATIVE 07/08/2018 0839   HGBUR SMALL (A) 07/08/2018 0839   BILIRUBINUR NEGATIVE 07/08/2018 0839   KETONESUR 5 (A) 07/08/2018 0839   PROTEINUR 100 (A) 07/08/2018 0839   UROBILINOGEN 0.2 10/23/2013 1545   NITRITE NEGATIVE 07/08/2018 0839   LEUKOCYTESUR LARGE (A) 07/08/2018 0839      Component Value Date/Time   PHART 7.346 (L) 07/08/2018 1235   PCO2ART 32.0 07/08/2018 1235   PO2ART 86.4 07/08/2018 1235   HCO3 17.0 (L) 07/08/2018 1235   TCO2 25 11/28/2015 1304   ACIDBASEDEF 7.5 (H) 07/08/2018 1235   O2SAT 95.9 07/08/2018 1235       Component Value Date/Time   IRON  40 (L) 04/17/2018 0734   TIBC 260 04/17/2018 0734   FERRITIN 39 04/17/2018 0734   IRONPCTSAT 15 (L) 04/17/2018 0734       ASSESSMENT/PLAN:      1.  Acute on chronic stage IV renal disease; probable baseline renal function 2.4-2.8 and etiology is hypertension, diabetes.   Acute insult likely ATN during E. coli sepsis.  Started HD 2/10.  No compelling indications for dialysis today, but no evidence of recovery of GFR.  Plan for dialysis tomorrow 3K 1-2L UF, Tight heparin.  Has temporary HD catheter at the current time.  2.  Sepsis secondary to E coli in urine and blood.  Continue antibiotics per ID - plan 2 wk course.  TEE Ok.  CT A/P showed diverticula.   3.  Metabolic encephalopathy.  Likely secondary to sepsis complicated by uremia.  LP not c/w infection.  Improving daily, slow process though.   4.  Hyperkalemia.  resolved  5.  Bioprosthetic AVR, Chronic dual HF with EF , A fib with RVR:  Cardiology following.  Anticoagulation on hold in light of  thrombocytopenia, rec start when able  7. HIV: on biktarvy   Pearson Grippe MD

## 2018-07-16 NOTE — Progress Notes (Signed)
Patient ID: Keith Young, male   DOB: 02-May-1951, 68 y.o.   MRN: [id number]  PROGRESS NOTE    YEISON SIPPEL  MWU:[id number] DOB: [birth year] DOA: 07/06/2018 PCP: Virgie Dad, MD   Brief Narrative:  68 year old African-American male with history of chronic systolic CHF with EF of 72% with AICD, CKD stage IV, diabetes mellitus type 2, COPD, hypertension, CVA, HIV presented with high fevers, altered mental status with nausea and vomiting on 07/06/2018 and was diagnosed with sepsis.  ID, neurology and nephrology were consulted.  He was initially started on broad-spectrum antibiotics.  Because of acute kidney injury and altered mental status, he was also started on dialysis.  He was found to have E. coli bacteremia.  He had LP done on 07/10/2018.   Assessment & Plan:   Principal Problem:   E coli bacteremia Active Problems:   Chronic kidney disease (CKD), active medical management without dialysis, stage 3 (moderate) (HCC)   Diabetes mellitus type 2 in nonobese [facility])   Essential hypertension, benign   ICD (implantable cardioverter-defibrillator) in place   Acute kidney injury ([place])   HIV disease ([place])   Chronic combined systolic and diastolic heart failure ([place])   Acute metabolic encephalopathy   Septic shock (HCC)   Encephalitis   ARF (acute renal failure) ([place])   Sepsis with encephalopathy and septic shock (HCC)   Tachycardia   Diverticulosis   Sepsis present on admission -Antibiotic plan as below.  Currently hemodynamically stable  E. coli bacteremia -Questionable cause.  ID has signed off.  ID recommends IV ceftazidime: Stop date is August 20, 2018 as per ID.  2D echo showed no vegetations.   -Repeat blood culture from 07/09/2018 is negative so far.  CT abdomen pelvis on 07/12/2018 shows diverticulosis without diverticulitis.  Acute metabolic/uremic encephalopathy -Initially there was a concern for meningitis but LP done on 07/10/2018 showed only 3 WBCs.  Hence patient does not  have meningitis.  Acyclovir and meningitis coverage were discontinued - Neurology has signed off; repeat CT of the head on 07/11/2018 showed no acute intracranial abnormality but showed chronic ischemic infarcts.  Patient cannot undergo MRI of the brain.  Keppra has been discontinued -Patient still slightly confused and slow to respond. -Diet as per SLP recommendations.  Acute kidney injury on chronic kidney disease stage IV -Baseline creatinine is around 2.5. -Creatinine 5.94 today.  Patient was started on dialysis on 07/09/2018.  Nephrology following.  Patient is receiving intermittent dialysis as per nephrology.  Nephrology is deciding on a day-to-day basis regarding need for dialysis.  Wheezing -Improving.  Continue prednisone 40 mg daily for 5 days total.  Continue Dulera.  HIV -Continue current antiretroviral regimen.  ID following  History of chronic systolic CHF with EF of 53%/GUYQ -Currently volume being managed by dialysis which was started on 07/09/2018.  Continue metoprolol.  Cardiology following.  Strict input and output.  Daily weights  Essential hypertension -Blood pressure stable for now.  Continue metoprolol.  Monitor.  New onset paroxysmal A. fib with RVR with intermittent episodes of NSVT -Currently rate controlled.  Patient was on heparin drip.  I discontinued the heparin drip on 07/11/2018 because of thrombocytopenia; cardiology following.  Continue metoprolol.  -We will start Coumadin once thrombocytopenia improves.   Diabetes mellitus type 2 -Continue Accu-Cheks with coverage.  Hemoglobin A1c was 6.5 on 05/22/2018  Thrombocytopenia -Questionable cause.  Improving.  No signs of bleeding.  Monitor   DVT prophylaxis: SCDs Code Status: DNR Family Communication: None at  bedside Disposition Plan: Depends on clinical outcome  Consultants: Nephrology/neurology/ID/IR/cardiology  Procedures: IR guided HD catheter placement on 07/09/2018 LP on 07/10/2018 by  IR  Antimicrobials:  Anti-infectives (From admission, onward)   Start     Dose/Rate Route Frequency Ordered Stop   07/12/18 2200  cefTAZidime (FORTAZ) 1 g in sodium chloride 0.9 % 100 mL IVPB     1 g 200 mL/hr over 30 Minutes Intravenous Every 24 hours 07/12/18 1449     07/10/18 1430  vancomycin (VANCOCIN) IVPB 1000 mg/200 mL premix  Status:  Discontinued     1,000 mg 200 mL/hr over 60 Minutes Intravenous Every 48 hours 07/08/18 1421 07/09/18 0959   07/10/18 1000  cefTRIAXone (ROCEPHIN) 2 g in sodium chloride 0.9 % 100 mL IVPB  Status:  Discontinued     2 g 200 mL/hr over 30 Minutes Intravenous Every 24 hours 07/10/18 0942 07/12/18 1433   07/10/18 0847  cefTRIAXone (ROCEPHIN) 2 g in sodium chloride 0.9 % 100 mL IVPB  Status:  Discontinued     2 g 200 mL/hr over 30 Minutes Intravenous Every 24 hours 07/09/18 1004 07/10/18 0933   07/09/18 1400  acyclovir (ZOVIRAX) 745 mg in dextrose 5 % 150 mL IVPB  Status:  Discontinued     10 mg/kg  74.5 kg 164.9 mL/hr over 60 Minutes Intravenous Every 24 hours 07/08/18 1421 07/09/18 0959   07/09/18 1300  bictegravir-emtricitabine-tenofovir AF (BIKTARVY) 50-200-25 MG per tablet 1 tablet     1 tablet Oral Daily 07/09/18 1012     07/08/18 2300  cefTRIAXone (ROCEPHIN) 2 g in sodium chloride 0.9 % 100 mL IVPB  Status:  Discontinued     2 g 200 mL/hr over 30 Minutes Intravenous Every 12 hours 07/08/18 1341 07/09/18 1004   07/08/18 1800  vancomycin (VANCOCIN) IVPB 1000 mg/200 mL premix  Status:  Discontinued     1,000 mg 200 mL/hr over 60 Minutes Intravenous Every 48 hours 07/06/18 2019 07/07/18 1137   07/08/18 1430  ampicillin (OMNIPEN) 2 g in sodium chloride 0.9 % 100 mL IVPB  Status:  Discontinued     2 g 300 mL/hr over 20 Minutes Intravenous Every 12 hours 07/08/18 1343 07/09/18 0959   07/08/18 1430  vancomycin (VANCOCIN) 1,500 mg in sodium chloride 0.9 % 500 mL IVPB     1,500 mg 250 mL/hr over 120 Minutes Intravenous  Once 07/08/18 1421 07/08/18 1723    07/08/18 1400  acyclovir (ZOVIRAX) 745 mg in dextrose 5 % 150 mL IVPB     10 mg/kg  74.5 kg 164.9 mL/hr over 60 Minutes Intravenous  Once 07/08/18 1345 07/09/18 1315   07/07/18 1800  ceFEPIme (MAXIPIME) 1 g in sodium chloride 0.9 % 100 mL IVPB  Status:  Discontinued     1 g 200 mL/hr over 30 Minutes Intravenous Every 24 hours 07/06/18 2019 07/07/18 1137   07/07/18 1145  cefTRIAXone (ROCEPHIN) 2 g in sodium chloride 0.9 % 100 mL IVPB  Status:  Discontinued     2 g 200 mL/hr over 30 Minutes Intravenous Every 24 hours 07/07/18 1137 07/08/18 1341   07/07/18 1000  bictegravir-emtricitabine-tenofovir AF (BIKTARVY) 50-200-25 MG per tablet 1 tablet  Status:  Discontinued     1 tablet Oral Daily 07/07/18 0058 07/07/18 1127   07/06/18 1900  ceFEPIme (MAXIPIME) 2 g in sodium chloride 0.9 % 100 mL IVPB     2 g 200 mL/hr over 30 Minutes Intravenous  Once 07/06/18 1859 07/06/18 2001   07/06/18 1900  vancomycin (VANCOCIN) IVPB 1000 mg/200 mL premix     1,000 mg 200 mL/hr over 60 Minutes Intravenous  Once 07/06/18 1859 07/06/18 2035      Subjective: Patient seen and examined at bedside.  He is sleepy, wakes up only very slightly, hardly answers any questions.  No overnight fever or vomiting  reported.   Objective: Vitals:   07/15/18 2007 07/15/18 2207 07/16/18 0448 07/16/18 0756  BP:  137/71 119/73   Pulse:  65 61 76  Resp:  19 18 18   Temp:  98.5 F (36.9 C) (!) 97.5 F (36.4 C)   TempSrc:  Oral Oral   SpO2: 97% 96% 97% 97%  Weight:      Height:        Intake/Output Summary (Last 24 hours) at 07/16/2018 0935 Last data filed at 07/15/2018 1800 Gross per 24 hour  Intake 240 ml  Output -  Net 240 ml   Filed Weights   07/13/18 1542 07/14/18 0528 07/15/18 0656  Weight: 74.2 kg 72.9 kg 73.6 kg    Examination:  General exam: No distress.  Sleepy, wakes up only very slightly, does not answer much questions  respiratory system: Bilateral decreased breath sounds at bases with scattered  crackles no wheezing Cardiovascular system: Rate controlled, S1-S2 heard Gastrointestinal system: Abdomen is nondistended, soft and nontender. Normal bowel sounds heard. Extremities: Bilateral lower extremity edema.  No cyanosis   Data Reviewed: I have personally reviewed following labs and imaging studies  CBC: Recent Labs  Lab 07/12/18 0336 07/13/18 0337 07/14/18 0531 07/15/18 0530 07/16/18 0430  WBC 2.4* 3.3* 4.3 4.3 2.8*  NEUTROABS  --  2.3 3.2 3.0 2.0  HGB 13.5 13.2 12.7* 12.4* 11.3*  HCT 42.9 41.9 41.3 38.4* 35.4*  MCV 92.5 91.7 90.2 90.4 90.1  PLT 51* 71* 68* 82* 89*   Basic Metabolic Panel: Recent Labs  Lab 07/11/18 0235 07/12/18 0336 07/13/18 0337 07/14/18 0531 07/15/18 0530 07/16/18 0430  NA 144 142 143 138 138 139  K 3.9 4.3 4.7 4.4 3.9 3.7  CL 107 108 109 104 104 104  CO2 26 25 24 23 27 22   GLUCOSE 114* 91 161* 157* 115* 135*  BUN 73* 44* 72* 53* 77* 87*  CREATININE 5.08* 4.10* 5.74* 4.43* 5.55* 5.94*  CALCIUM 7.7* 7.3* 7.4* 7.1* 6.7* 6.4*  MG 1.9 1.8 2.1 2.1  --  2.2   GFR: Estimated Creatinine Clearance: 11 mL/min (A) (by C-G formula based on SCr of 5.94 mg/dL (H)). Liver Function Tests: Recent Labs  Lab 07/16/18 0430  AST 31  ALT 36  ALKPHOS 62  BILITOT 0.5  PROT 5.5*  ALBUMIN 2.0*   No results for input(s): LIPASE, AMYLASE in the last 168 hours. No results for input(s): AMMONIA in the last 168 hours. Coagulation Profile: No results for input(s): INR, PROTIME in the last 168 hours. Cardiac Enzymes: No results for input(s): CKTOTAL, CKMB, CKMBINDEX, TROPONINI in the last 168 hours. BNP (last 3 results) No results for input(s): PROBNP in the last 8760 hours. HbA1C: No results for input(s): HGBA1C in the last 72 hours. CBG: Recent Labs  Lab 07/15/18 1649 07/15/18 2032 07/16/18 0031 07/16/18 0445 07/16/18 0821  GLUCAP 252* 275* 249* 111* 91   Lipid Profile: No results for input(s): CHOL, HDL, LDLCALC, TRIG, CHOLHDL, LDLDIRECT in  the last 72 hours. Thyroid Function Tests: No results for input(s): TSH, T4TOTAL, FREET4, T3FREE, THYROIDAB in the last 72 hours. Anemia Panel: No results for input(s): VITAMINB12, FOLATE, FERRITIN, TIBC,  IRON, RETICCTPCT in the last 72 hours. Sepsis Labs: Recent Labs  Lab 07/10/18 0317  PROCALCITON >150.00    Recent Results (from the past 240 hour(s))  Blood Culture (routine x 2)     Status: Abnormal   Collection Time: 07/06/18  7:07 PM  Result Value Ref Range Status   Specimen Description BLOOD RIGHT FOREARM  Final   Special Requests   Final    BOTTLES DRAWN AEROBIC AND ANAEROBIC Blood Culture adequate volume   Culture  Setup Time   Final    GRAM NEGATIVE RODS ANAEROBIC BOTTLE ONLY CRITICAL VALUE NOTED.  VALUE IS CONSISTENT WITH PREVIOUSLY REPORTED AND CALLED VALUE.    Culture (A)  Final    ESCHERICHIA COLI SUSCEPTIBILITIES PERFORMED ON PREVIOUS CULTURE WITHIN THE LAST 5 DAYS. Performed at [facility], [place] [address], [place], [place] [address]    Report Status 07/11/2018 FINAL  Final  Blood Culture (routine x 2)     Status: Abnormal   Collection Time: 07/06/18  7:40 PM  Result Value Ref Range Status   Specimen Description BLOOD LEFT ANTECUBITAL  Final   Special Requests   Final    BOTTLES DRAWN AEROBIC AND ANAEROBIC Blood Culture adequate volume   Culture  Setup Time   Final    GRAM NEGATIVE RODS IN BOTH AEROBIC AND ANAEROBIC BOTTLES CRITICAL RESULT CALLED TO, READ BACK BY AND VERIFIED WITHRonnald Nian [id number] FCP Performed at [facility], [place] [address] [place], [place] [address]    Culture ESCHERICHIA COLI (A)  Final   Report Status 07/09/2018 FINAL  Final   Organism ID, Bacteria ESCHERICHIA COLI  Final      Susceptibility   Escherichia coli - MIC*    AMPICILLIN >=32 RESISTANT Resistant     CEFAZOLIN 16 SENSITIVE Sensitive     CEFEPIME <=1 SENSITIVE Sensitive     CEFTAZIDIME <=1 SENSITIVE Sensitive     CEFTRIAXONE <=1 SENSITIVE  Sensitive     CIPROFLOXACIN <=0.25 SENSITIVE Sensitive     GENTAMICIN <=1 SENSITIVE Sensitive     IMIPENEM <=0.25 SENSITIVE Sensitive     TRIMETH/SULFA <=20 SENSITIVE Sensitive     AMPICILLIN/SULBACTAM >=32 RESISTANT Resistant     PIP/TAZO <=4 SENSITIVE Sensitive     Extended ESBL NEGATIVE Sensitive     * ESCHERICHIA COLI  Blood Culture ID Panel (Reflexed)     Status: Abnormal   Collection Time: 07/06/18  7:40 PM  Result Value Ref Range Status   Enterococcus species NOT DETECTED NOT DETECTED Final   Listeria monocytogenes NOT DETECTED NOT DETECTED Final   Staphylococcus species NOT DETECTED NOT DETECTED Final   Staphylococcus aureus (BCID) NOT DETECTED NOT DETECTED Final   Streptococcus species NOT DETECTED NOT DETECTED Final   Streptococcus agalactiae NOT DETECTED NOT DETECTED Final   Streptococcus pneumoniae NOT DETECTED NOT DETECTED Final   Streptococcus pyogenes NOT DETECTED NOT DETECTED Final   Acinetobacter baumannii NOT DETECTED NOT DETECTED Final   Enterobacteriaceae species DETECTED (A) NOT DETECTED Final    Comment: Enterobacteriaceae represent a large family of gram-negative bacteria, not a single organism. CRITICAL RESULT CALLED TO, READ BACK BY AND VERIFIED WITH: PHARMD MANCHIL, B [phone] FCP    Enterobacter cloacae complex NOT DETECTED NOT DETECTED Final   Escherichia coli DETECTED (A) NOT DETECTED Final    Comment: CRITICAL RESULT CALLED TO, READ BACK BY AND VERIFIED WITH: PHARMD MANCHIL, B [id number] FCP    Klebsiella oxytoca NOT DETECTED NOT DETECTED  Final   Klebsiella pneumoniae NOT DETECTED NOT DETECTED Final   Proteus species NOT DETECTED NOT DETECTED Final   Serratia marcescens NOT DETECTED NOT DETECTED Final   Carbapenem resistance NOT DETECTED NOT DETECTED Final   Haemophilus influenzae NOT DETECTED NOT DETECTED Final   Neisseria meningitidis NOT DETECTED NOT DETECTED Final   Pseudomonas aeruginosa NOT DETECTED NOT DETECTED Final   Candida albicans  NOT DETECTED NOT DETECTED Final   Candida glabrata NOT DETECTED NOT DETECTED Final   Candida krusei NOT DETECTED NOT DETECTED Final   Candida parapsilosis NOT DETECTED NOT DETECTED Final   Candida tropicalis NOT DETECTED NOT DETECTED Final    Comment: Performed at [facility], [place] [address] [place], [place] [address]  MRSA PCR Screening     Status: None   Collection Time: 07/07/18  3:24 PM  Result Value Ref Range Status   MRSA by PCR NEGATIVE NEGATIVE Final    Comment:        The GeneXpert MRSA Assay (FDA approved for NASAL specimens only), is one component of a comprehensive MRSA colonization surveillance program. It is not intended to diagnose MRSA infection nor to guide or monitor treatment for MRSA infections. Performed at [facility], [place] [address], [place], [place] [address]   Culture, Urine     Status: None   Collection Time: 07/08/18  7:47 AM  Result Value Ref Range Status   Specimen Description URINE, RANDOM  Final   Special Requests NONE  Final   Culture   Final    NO GROWTH Performed at [facility], [place] [address], [place], [place] [address]    Report Status 07/09/2018 FINAL  Final  Culture, blood (Routine X 2) w Reflex to ID Panel     Status: None   Collection Time: 07/09/18 10:18 AM  Result Value Ref Range Status   Specimen Description BLOOD RIGHT HAND  Final   Special Requests   Final    BOTTLES DRAWN AEROBIC AND ANAEROBIC Blood Culture adequate volume   Culture   Final    NO GROWTH 5 DAYS Performed at [facility], [place] [address] [place], [place] [address]    Report Status 07/14/2018 FINAL  Final  Culture, blood (Routine X 2) w Reflex to ID Panel     Status: None   Collection Time: 07/09/18 10:25 AM  Result Value Ref Range Status   Specimen Description BLOOD LEFT HAND  Final   Special Requests   Final    BOTTLES DRAWN AEROBIC AND ANAEROBIC Blood Culture adequate volume   Culture   Final    NO GROWTH 5 DAYS Performed at  [facility], [place] [address] [place], [place] [address]    Report Status 07/14/2018 FINAL  Final  C difficile quick scan w PCR reflex     Status: Abnormal   Collection Time: 07/09/18 10:38 AM  Result Value Ref Range Status   C Diff antigen POSITIVE (A) NEGATIVE Final   C Diff toxin NEGATIVE NEGATIVE Final   C Diff interpretation Results are indeterminate. See PCR results.  Final    Comment: Performed at [facility], [place] [address] [place], [place] [address]  C. Diff by PCR, Reflexed     Status: Abnormal   Collection Time: 07/09/18 10:38 AM  Result Value Ref Range Status   Toxigenic C. Difficile by PCR POSITIVE (A) NEGATIVE Final    Comment: Positive for toxigenic C. difficile with little  to no toxin production. Only treat if clinical presentation suggests symptomatic illness. Performed at [facility], [place] [address], [place], [place] [address]   CSF culture     Status: None   Collection Time: 07/10/18  2:44 PM  Result Value Ref Range Status   Specimen Description CSF  Final   Special Requests NONE  Final   Gram Stain   Final    WBC PRESENT,BOTH PMN AND MONONUCLEAR NO ORGANISMS SEEN CYTOSPIN SMEAR    Culture   Final    NO GROWTH 3 DAYS Performed at [facility], [place] [address] [place],  [address]    Report Status 07/13/2018 FINAL  Final  Fungus Culture With Stain     Status: None (Preliminary result)   Collection Time: 07/10/18  2:44 PM  Result Value Ref Range Status   Fungus Stain Final report  Final    Comment: (NOTE) Performed At: [facility] [place], [place] [id number] Rush Farmer MD [id number]    Fungus (Mycology) Culture PENDING  Incomplete   Fungal Source CSF  Final  Fungus Culture Result     Status: None   Collection Time: 07/10/18  2:44 PM  Result Value Ref Range Status   Result 1 Comment  Final    Comment: (NOTE) KOH/Calcofluor preparation:  no fungus observed. Performed At: [facility] [place], [place] [id number] Rush Farmer MD HC:[id number]          Radiology Studies: Dg Swallowing Func-speech Pathology  Result Date: 07/14/2018 Objective Swallowing Evaluation: Type of Study: MBS-Modified Barium Swallow Study  Patient Details Name: CAIDYN HENRICKSEN MRN: [id number] Date of Birth: 21-Feb-1951 Today's Date: 07/14/2018 Time: SLP Start Time (ACUTE ONLY): 64 -SLP Stop Time (ACUTE ONLY): 1150 SLP Time Calculation (min) (ACUTE ONLY): 20 min Past Medical History: Past Medical History: Diagnosis Date . AICD (automatic cardioverter/defibrillator) present  . Alcoholism in remission ([place])  . Anemia  . Aortic insufficiency  . Arthritis  . At moderate risk for fall  . Bell's palsy  . Chronic systolic heart failure (HCC)   NYHA Class III . CKD (chronic kidney disease), stage IV ([place])  . COPD (chronic obstructive pulmonary disease) ([place])  . Essential hypertension, benign  . HIV disease ([place]) 09/06/2016 . Hyperlipidemia  . NICM (nonischemic cardiomyopathy) ([place])  . Noncompliance with medication regimen  . NSVT (nonsustained ventricular tachycardia) ([place])  . Numbness of right jaw   Had a stoke in 01/2013. Numbness is occasional, especially when trying to chew. . OSA (obstructive sleep apnea)  . Productive cough 08/2013  With brown sputum  . S/P aortic valve replacement with bioprosthetic valve  . Stroke ([place]) 01/2013  weakness of right side from CVA . Type 2 diabetes mellitus ([place])  . Uses hearing aid 2014  recently received new hearing aids Past Surgical History: Past Surgical History: Procedure Laterality Date . BIOPSY N/A 04/10/2014  Procedure: GASTRIC BIOPSY;  Surgeon: Daneil Dolin, MD;  Location: [facility] ORS;  Service: Endoscopy;  Laterality: N/A; . BIOPSY  10/12/2015  Procedure: BIOPSY;  Surgeon: Daneil Dolin, MD;  Location: [facility] ENDO SUITE;  Service: Endoscopy;;  stomach bx's . CARDIAC DEFIBRILLATOR PLACEMENT  11/12/12  [employer] Inogen MINI ICD implanted in [place] at  [facility] per Dr Nelly Laurence note . COLONOSCOPY WITH PROPOFOL N/A 04/10/2014  RMR: Colonic Diverticulosis . ESOPHAGOGASTRODUODENOSCOPY (EGD) WITH PROPOFOL N/A 04/10/2014  RMR: Mild erosive reflux esophagitis. Multiple  antral polyps likely hyperplastic status post removal by hot snare cautery technique. Diffusely abnormal stomach status post gastric biopsy I suspect some of patients anemaia may be due to intermittent oozing from the stomach. It would be difficult and a risky proposition to attempt complete removal of all of his gastric polyps. . ESOPHAGOGASTRODUODENOSCOPY (EGD) WITH PROPOFOL N/A 10/12/2015  Procedure: ESOPHAGOGASTRODUODENOSCOPY (EGD) WITH PROPOFOL;  Surgeon: Daneil Dolin, MD;  Location: [facility];  Service: Endoscopy;  Laterality: N/A;  2505 - moved to 9:15 . IR FLUORO GUIDE CV LINE RIGHT  07/09/2018 . IR US GUIDE VASC ACCESS RIGHT  07/09/2018 . POLYPECTOMY N/A 04/10/2014  Procedure: GASTRIC POLYPECTOMY;  Surgeon: Daneil Dolin, MD;  Location: [facility];  Service: Endoscopy;  Laterality: N/A; . RIGHT HEART CATHETERIZATION N/A 02/06/2014  Procedure: RIGHT HEART CATH;  Surgeon: Larey Dresser, MD;  Location: [facility] CATH LAB;  Service: Cardiovascular;  Laterality: N/A; HPI: YIDA HYAMS is a 68 y.o. male with PMH significant of HIV, type 2 diabetes, COPD, CVA ([address], HTN, Bells Palsy, systolic CHF, CKD, and history of AICD placement followed by cardiology.  Pt was admitted to [facility] ED 07/07/18 and after presenting with AMS at SNF residence, vomitted, and became increasingly unresponsive. Per PA note, pt was found to be encephalopathic secondary to sepsis and renal failure. Pt seen by ST in Jan 2018; BSE recc D2/thin. CXR showed cardiomegaly and small left pleural effusion. Head CT revealed new age indeterminate RIGHT basal ganglia small infarcts, old RIGHT temporal occipital/PCA territory infarct, old basal ganglia, thalami, pontine and cerebellar small  infarcts.  Subjective: alert, cooperative Assessment / Plan / Recommendation CHL IP CLINICAL IMPRESSIONS 07/14/2018 Clinical Impression Pt presents with primary moderate oral dysphagia, mild pharyngeal dysphagia. There is lingual pumping, decreased bolus cohesion, piecemeal deglutition and premature spillage (to pyriforms with liquids, valleculae with solids). Premature spillage results in intermittent deep larygneal penetration of thin, nectar, and honey thick liquids, with aspiration (sensed and silent) of thin and nectar. Due to impulsivity pt at risk for aspiration with liquids of any consistency. With solids, pt without rotary chew, lingual thrusting noted with poor cohesion and spillage of solids to the valleculae. SLP used straw to facilitate chin tuck, which prevented aspiration with thin liquids, including small and large sips and consecutive boluses. There was trace shallow penetration with chin tuck which reversed with swallow. Brief stasis of barium tablet in mid-thoracic esophagus, cleared pt's GE junction with liquid wash. Recommend dys 2, thin liquids, meds whole in puree; pt must tuck chin when swallowing for airway protection. Recommend full supervision due to level of cuing necessary. SLP will follow for tolerance and training in swallow precautions/compensations.  SLP Visit Diagnosis Dysphagia, oral phase (R13.11);Dysphagia, pharyngeal phase (R13.13) Attention and concentration deficit following -- Frontal lobe and executive function deficit following -- Impact on safety and function Moderate aspiration risk   CHL IP TREATMENT RECOMMENDATION 07/14/2018 Treatment Recommendations Therapy as outlined in treatment plan below   Prognosis 07/14/2018 Prognosis for Safe Diet Advancement Good Barriers to Reach Goals Cognitive deficits Barriers/Prognosis Comment -- CHL IP DIET RECOMMENDATION 07/14/2018 SLP Diet Recommendations Dysphagia 2 (Fine chop) solids;Thin liquid Liquid Administration via Straw Medication  Administration Whole meds with puree Compensations Slow rate;Small sips/bites;Chin tuck;Clear throat intermittently;Use straw to facilitate chin tuck Postural Changes --   CHL IP OTHER RECOMMENDATIONS 07/14/2018 Recommended Consults -- Oral Care Recommendations Oral care BID Other Recommendations --   CHL IP FOLLOW UP RECOMMENDATIONS 07/14/2018 Follow up Recommendations Skilled Nursing facility  CHL IP FREQUENCY AND DURATION 07/14/2018 Speech Therapy Frequency (ACUTE ONLY) min 2x/week Treatment Duration 2 weeks      CHL IP ORAL PHASE 07/14/2018 Oral Phase Impaired Oral - Pudding Teaspoon -- Oral - Pudding Cup -- Oral - Honey Teaspoon NT Oral - Honey Cup Weak lingual manipulation;Lingual pumping;Reduced posterior propulsion;Piecemeal swallowing;Decreased bolus cohesion;Premature spillage Oral - Nectar Teaspoon Weak lingual manipulation;Lingual pumping;Reduced posterior propulsion;Piecemeal swallowing;Decreased bolus cohesion;Premature spillage Oral - Nectar Cup Weak lingual manipulation;Lingual pumping;Reduced posterior propulsion;Piecemeal swallowing;Decreased bolus cohesion;Premature spillage Oral - Nectar Straw Weak lingual manipulation;Lingual pumping;Reduced posterior propulsion;Piecemeal swallowing;Delayed oral transit;Decreased bolus cohesion Oral - Thin Teaspoon Lingual pumping;Reduced posterior propulsion;Piecemeal swallowing;Decreased bolus cohesion;Premature spillage Oral - Thin Cup Weak lingual manipulation;Lingual pumping;Reduced posterior propulsion;Piecemeal swallowing;Decreased bolus cohesion;Premature spillage Oral - Thin Straw Weak lingual manipulation;Reduced posterior propulsion;Piecemeal swallowing;Decreased bolus cohesion;Premature spillage Oral - Puree Weak lingual manipulation;Lingual pumping;Reduced posterior propulsion;Piecemeal swallowing;Decreased bolus cohesion;Premature spillage Oral - Mech Soft Impaired mastication;Weak lingual manipulation;Lingual pumping;Reduced posterior  propulsion;Lingual/palatal residue;Piecemeal swallowing;Delayed oral transit;Premature spillage;Decreased bolus cohesion Oral - Regular -- Oral - Multi-Consistency -- Oral - Pill Other (Comment) Oral Phase - Comment --  CHL IP PHARYNGEAL PHASE 07/14/2018 Pharyngeal Phase Impaired Pharyngeal- Pudding Teaspoon -- Pharyngeal -- Pharyngeal- Pudding Cup -- Pharyngeal -- Pharyngeal- Honey Teaspoon -- Pharyngeal -- Pharyngeal- Honey Cup Penetration/Aspiration before swallow;Penetration/Aspiration during swallow Pharyngeal Material enters airway, remains ABOVE vocal cords then ejected out;Material enters airway, remains ABOVE vocal cords and not ejected out Pharyngeal- Nectar Teaspoon -- Pharyngeal -- Pharyngeal- Nectar Cup Penetration/Aspiration before swallow;Penetration/Aspiration during swallow;Trace aspiration Pharyngeal Material does not enter airway;Material enters airway, CONTACTS cords and not ejected out;Material enters airway, passes BELOW cords without attempt by patient to eject out (silent aspiration) Pharyngeal- Nectar Straw Penetration/Aspiration before swallow;Penetration/Aspiration during swallow;Trace aspiration Pharyngeal Material enters airway, CONTACTS cords and not ejected out;Material enters airway, passes BELOW cords without attempt by patient to eject out (silent aspiration) Pharyngeal- Thin Teaspoon Penetration/Aspiration before swallow;Penetration/Aspiration during swallow;Moderate aspiration Pharyngeal Material enters airway, passes BELOW cords and not ejected out despite cough attempt by patient Pharyngeal- Thin Cup Penetration/Aspiration before swallow;Penetration/Aspiration during swallow;Moderate aspiration Pharyngeal Material does not enter airway;Material enters airway, CONTACTS cords and not ejected out;Material enters airway, passes BELOW cords and not ejected out despite cough attempt by patient;Material enters airway, passes BELOW cords without attempt by patient to eject out (silent  aspiration) Pharyngeal- Thin Straw Penetration/Aspiration during swallow;Other (Comment) Pharyngeal Material does not enter airway;Material enters airway, remains ABOVE vocal cords then ejected out Pharyngeal- Puree WFL Pharyngeal -- Pharyngeal- Mechanical Soft WFL Pharyngeal -- Pharyngeal- Regular -- Pharyngeal -- Pharyngeal- Multi-consistency -- Pharyngeal -- Pharyngeal- Pill Other (Comment) Pharyngeal -- Pharyngeal Comment --  CHL IP CERVICAL ESOPHAGEAL PHASE 07/14/2018 Cervical Esophageal Phase WFL Pudding Teaspoon -- Pudding Cup -- Honey Teaspoon -- Honey Cup -- Nectar Teaspoon -- Nectar Cup -- Nectar Straw -- Thin Teaspoon -- Thin Cup -- Thin Straw -- Puree -- Mechanical Soft -- Regular -- Multi-consistency -- Pill -- Cervical Esophageal Comment -- Deneise Lever, MS, CCC-SLP Speech-Language Pathologist [facility] Pager: [phone] Office: [phone] Aliene Altes 07/14/2018, 1:46 PM                   Scheduled Meds: . bictegravir-emtricitabine-tenofovir AF  1 tablet Oral Daily  . chlorhexidine  15 mL Mouth Rinse BID  . Chlorhexidine Gluconate Cloth  6 each Topical Q0600  . insulin aspart  0-9 Units Subcutaneous Q4H  . mouth rinse  15 mL Mouth Rinse q12n4p  . metoprolol succinate  25  mg Oral Daily  . mometasone-formoterol  2 puff Inhalation BID  . predniSONE  40 mg Oral Q breakfast   Continuous Infusions: . sodium chloride Stopped (07/15/18 0106)  . cefTAZidime (FORTAZ)  IV 1 g (07/15/18 2102)     LOS: 9 days        Aline August, MD [facility] Pager [phone]  If 7PM-7AM, please contact night-coverage [email or web address] Password [facility] 07/16/2018, 9:35 AM

## 2018-07-16 NOTE — Progress Notes (Signed)
  Speech Language Pathology Treatment: Dysphagia  Patient Details Name: Keith Young MRN: [id number] DOB: November 23, 1950 Today's Date: 07/16/2018 Time: 3299-2426 SLP Time Calculation (min) (ACUTE ONLY): 15 min  Assessment / Plan / Recommendation Clinical Impression  Per RN report, pt has been tolerating chopped diet with honey thick liquids and was able to take medications without difficulty.  With trials of thin liquid there was immediate, strong, wet cough with red face and eye watering even with HoB in fully upright position and use of chin tuck posture, which was noted be be beneficial on MBSS.  Unclear if pt is unable to execute chin tuck as successfully as he did during instrumental study, or if chin tuck maneuver is no longer effective.  Pt may benefit from further training with chin tuck compensatory strategy in attempt to advance diet.  Pt tolerated simulated ground consistency and honey thick liquid with no clinical s/s of aspiration.  Pt benefited from verbal cue to clear throat during PO intake. Continue ground/chopped diet with honey thick liquid.    HPI HPI: Keith Young is a 68 y.o. male with PMH significant of HIV, type 2 diabetes, COPD, CVA (83419, HTN, Bells Palsy, systolic CHF, CKD, and history of AICD placement followed by cardiology. Pt was admitted to [facility] ED 07/07/18 and after presenting with AMS at SNF residence, vomitted, and became increasingly unresponsive. Per PA note, pt was found to be encephalopathic secondary to sepsis and renal failure. Pt seen by ST in Jan 2018; BSE recc D2/thin. CXR showed cardiomegaly and small left pleural effusion. Head CT revealed new age indeterminate RIGHT basal ganglia small infarcts, old RIGHT temporal occipital/PCA territory infarct, old basal ganglia, thalami, pontine and cerebellar small infarcts.  No new imaging since MBSS      SLP Plan  Continue with current plan of care       Recommendations  Diet recommendations: Dysphagia 2  (fine chop);Honey-thick liquid Liquids provided via: Cup Medication Administration: Whole meds with puree Supervision: Full supervision/cueing for compensatory strategies Compensations: Slow rate;Small sips/bites;Minimize environmental distractions;Clear throat intermittently Postural Changes and/or Swallow Maneuvers: Seated upright 90 degrees                Oral Care Recommendations: Oral care BID Follow up Recommendations: Skilled Nursing facility SLP Visit Diagnosis: Dysphagia, oropharyngeal phase (R13.12) Plan: Continue with current plan of care       [place], [place], [place] Office: [phone]; Pager (2/17): [phone] 07/16/2018, 11:51 AM

## 2018-07-16 NOTE — Progress Notes (Signed)
Progress Note  Patient Name: Keith Young Date of Encounter: 07/16/2018  Primary Cardiologist: No primary care provider on file. Dr. Jama Flavors. Lovena Le (EP)  Subjective   No complaints.   Inpatient Medications    Scheduled Meds: . bictegravir-emtricitabine-tenofovir AF  1 tablet Oral Daily  . chlorhexidine  15 mL Mouth Rinse BID  . Chlorhexidine Gluconate Cloth  6 each Topical Q0600  . Chlorhexidine Gluconate Cloth  6 each Topical Q0600  . Chlorhexidine Gluconate Cloth  6 each Topical Q0600  . insulin aspart  0-9 Units Subcutaneous Q4H  . mouth rinse  15 mL Mouth Rinse q12n4p  . metoprolol succinate  25 mg Oral Daily  . mometasone-formoterol  2 puff Inhalation BID  . predniSONE  40 mg Oral Q breakfast   Continuous Infusions: . sodium chloride Stopped (07/15/18 0106)  . cefTAZidime (FORTAZ)  IV 1 g (07/15/18 2102)   PRN Meds: sodium chloride, acetaminophen **OR** acetaminophen, albuterol, metoprolol tartrate, ondansetron **OR** ondansetron (ZOFRAN) IV, RESOURCE THICKENUP CLEAR, sodium chloride flush   Vital Signs    Vitals:   07/15/18 2007 07/15/18 2207 07/16/18 0448 07/16/18 0756  BP:  137/71 119/73   Pulse:  65 61 76  Resp:  '19 18 18  ' Temp:  98.5 F (36.9 C) (!) 97.5 F (36.4 C)   TempSrc:  Oral Oral   SpO2: 97% 96% 97% 97%  Weight:      Height:        Intake/Output Summary (Last 24 hours) at 07/16/2018 0859 Last data filed at 07/15/2018 1800 Gross per 24 hour  Intake 240 ml  Output -  Net 240 ml   Last 3 Weights 07/15/2018 07/14/2018 07/13/2018  Weight (lbs) 162 lb 4.1 oz 160 lb 11.5 oz 163 lb 9.3 oz  Weight (kg) 73.6 kg 72.9 kg 74.2 kg      Telemetry    sinus. - Personally Reviewed  ECG    n/a - Personally Reviewed  Physical Exam   VS:  BP 119/73   Pulse 76   Temp (!) 97.5 F (36.4 C) (Oral)   Resp 18   Ht '5\' 3"'  (1.6 m)   Wt 73.6 kg   SpO2 97%   BMI 28.74 kg/m  , BMI Body mass index is 28.74 kg/m.  General: Chronically ill  appearing, NAD HEENT: OP clear, mucus membranes moist  SKIN: warm, dry. No rashes. Neuro: No focal deficits  Musculoskeletal: Muscle strength 5/5 all ext  Psychiatric: Mood and affect normal  Neck: No JVD, no carotid bruits, no thyromegaly, no lymphadenopathy.  Lungs:Clear bilaterally, no wheezes, rhonci, crackles Cardiovascular: Regular rate and rhythm. Systolic murmur.  Abdomen:Soft. Bowel sounds present. Non-tender.  Extremities: No lower extremity edema. Pulses are 2 + in the bilateral DP/PT.  Labs    Chemistry Recent Labs  Lab 07/14/18 0531 07/15/18 0530 07/16/18 0430  NA 138 138 139  K 4.4 3.9 3.7  CL 104 104 104  CO2 '23 27 22  ' GLUCOSE 157* 115* 135*  BUN 53* 77* 87*  CREATININE 4.43* 5.55* 5.94*  CALCIUM 7.1* 6.7* 6.4*  PROT  --   --  5.5*  ALBUMIN  --   --  2.0*  AST  --   --  31  ALT  --   --  36  ALKPHOS  --   --  62  BILITOT  --   --  0.5  GFRNONAA 13* 10* 9*  GFRAA 15* 11* 10*  ANIONGAP 11 7 13  Hematology Recent Labs  Lab 07/14/18 0531 07/15/18 0530 07/16/18 0430  WBC 4.3 4.3 2.8*  RBC 4.58 4.25 3.93*  HGB 12.7* 12.4* 11.3*  HCT 41.3 38.4* 35.4*  MCV 90.2 90.4 90.1  MCH 27.7 29.2 28.8  MCHC 30.8 32.3 31.9  RDW 14.5 14.5 14.3  PLT 68* 82* 89*    Cardiac EnzymesNo results for input(s): TROPONINI in the last 168 hours. No results for input(s): TROPIPOC in the last 168 hours.   BNP No results for input(s): BNP, PROBNP in the last 168 hours.   DDimer No results for input(s): DDIMER in the last 168 hours.   Radiology    Dg Swallowing Func-speech Pathology  Result Date: 07/14/2018 Objective Swallowing Evaluation: Type of Study: MBS-Modified Barium Swallow Study  Patient Details Name: Keith Young MRN: [id number] Date of Birth: 03-Jan-1951 Today's Date: 07/14/2018 Time: SLP Start Time (ACUTE ONLY): 34 -SLP Stop Time (ACUTE ONLY): 1150 SLP Time Calculation (min) (ACUTE ONLY): 20 min Past Medical History: Past Medical History: Diagnosis Date .  AICD (automatic cardioverter/defibrillator) present  . Alcoholism in remission ([place])  . Anemia  . Aortic insufficiency  . Arthritis  . At moderate risk for fall  . Bell's palsy  . Chronic systolic heart failure (HCC)   NYHA Class III . CKD (chronic kidney disease), stage IV ([place])  . COPD (chronic obstructive pulmonary disease) ([place])  . Essential hypertension, benign  . HIV disease ([place]) 09/06/2016 . Hyperlipidemia  . NICM (nonischemic cardiomyopathy) ([place])  . Noncompliance with medication regimen  . NSVT (nonsustained ventricular tachycardia) ([place])  . Numbness of right jaw   Had a stoke in 01/2013. Numbness is occasional, especially when trying to chew. . OSA (obstructive sleep apnea)  . Productive cough 08/2013  With brown sputum  . S/P aortic valve replacement with bioprosthetic valve  . Stroke ([place]) 01/2013  weakness of right side from CVA . Type 2 diabetes mellitus ([place])  . Uses hearing aid 2014  recently received new hearing aids Past Surgical History: Past Surgical History: Procedure Laterality Date . BIOPSY N/A 04/10/2014  Procedure: GASTRIC BIOPSY;  Surgeon: Daneil Dolin, MD;  Location: [facility];  Service: Endoscopy;  Laterality: N/A; . BIOPSY  10/12/2015  Procedure: BIOPSY;  Surgeon: Daneil Dolin, MD;  Location: [facility] END[facility] SUITE;  Service: Endoscopy;;  stomach bx's . CARDIAC DEFIBRILLATOR PLACEMENT  11/12/12  [employer] Inogen MINI ICD implanted in [place] at [facility] per Dr Nelly Laurence note . COLONOSCOPY WITH PROPOFOL N/A 04/10/2014  RMR: Colonic Diverticulosis . ESOPHAGOGASTRODUODENOSCOPY (EGD) WITH PROPOFOL N/A 04/10/2014  RMR: Mild erosive reflux esophagitis. Multiple antral polyps likely hyperplastic status post removal by hot snare cautery technique. Diffusely abnormal stomach status post gastric biopsy I suspect some of patients anemaia may be due to intermittent oozing from the stomach. It would be difficult and a risky proposition to attempt  complete removal of all of his gastric polyps. . ESOPHAGOGASTRODUODENOSCOPY (EGD) WITH PROPOFOL N/A 10/12/2015  Procedure: ESOPHAGOGASTRODUODENOSCOPY (EGD) WITH PROPOFOL;  Surgeon: Daneil Dolin, MD;  Location: [facility];  Service: Endoscopy;  Laterality: N/A;  1749 - moved to 9:15 . IR FLUORO GUIDE CV LINE RIGHT  07/09/2018 . IR US GUIDE VASC ACCESS RIGHT  07/09/2018 . POLYPECTOMY N/A 04/10/2014  Procedure: GASTRIC POLYPECTOMY;  Surgeon: Daneil Dolin, MD;  Location: [facility];  Service: Endoscopy;  Laterality: N/A; . RIGHT HEART CATHETERIZATION N/A 02/06/2014  Procedure: RIGHT HEART CATH;  Surgeon: Elby Showers  Aundra Dubin, MD;  Location: [facility] CATH LAB;  Service: Cardiovascular;  Laterality: N/A; HPI: COLLIER BOHNET is a 69 y.o. male with PMH significant of HIV, type 2 diabetes, COPD, CVA (84166, HTN, Bells Palsy, systolic CHF, CKD, and history of AICD placement followed by cardiology.  Pt was admitted to [facility] ED 07/07/18 and after presenting with AMS at SNF residence, vomitted, and became increasingly unresponsive. Per PA note, pt was found to be encephalopathic secondary to sepsis and renal failure. Pt seen by ST in Jan 2018; BSE recc D2/thin. CXR showed cardiomegaly and small left pleural effusion. Head CT revealed new age indeterminate RIGHT basal ganglia small infarcts, old RIGHT temporal occipital/PCA territory infarct, old basal ganglia, thalami, pontine and cerebellar small infarcts.  Subjective: alert, cooperative Assessment / Plan / Recommendation CHL IP CLINICAL IMPRESSIONS 07/14/2018 Clinical Impression Pt presents with primary moderate oral dysphagia, mild pharyngeal dysphagia. There is lingual pumping, decreased bolus cohesion, piecemeal deglutition and premature spillage (to pyriforms with liquids, valleculae with solids). Premature spillage results in intermittent deep larygneal penetration of thin, nectar, and honey thick liquids, with aspiration (sensed and silent) of thin and nectar. Due to impulsivity pt at  risk for aspiration with liquids of any consistency. With solids, pt without rotary chew, lingual thrusting noted with poor cohesion and spillage of solids to the valleculae. SLP used straw to facilitate chin tuck, which prevented aspiration with thin liquids, including small and large sips and consecutive boluses. There was trace shallow penetration with chin tuck which reversed with swallow. Brief stasis of barium tablet in mid-thoracic esophagus, cleared pt's GE junction with liquid wash. Recommend dys 2, thin liquids, meds whole in puree; pt must tuck chin when swallowing for airway protection. Recommend full supervision due to level of cuing necessary. SLP will follow for tolerance and training in swallow precautions/compensations.  SLP Visit Diagnosis Dysphagia, oral phase (R13.11);Dysphagia, pharyngeal phase (R13.13) Attention and concentration deficit following -- Frontal lobe and executive function deficit following -- Impact on safety and function Moderate aspiration risk   CHL IP TREATMENT RECOMMENDATION 07/14/2018 Treatment Recommendations Therapy as outlined in treatment plan below   Prognosis 07/14/2018 Prognosis for Safe Diet Advancement Good Barriers to Reach Goals Cognitive deficits Barriers/Prognosis Comment -- CHL IP DIET RECOMMENDATION 07/14/2018 SLP Diet Recommendations Dysphagia 2 (Fine chop) solids;Thin liquid Liquid Administration via Straw Medication Administration Whole meds with puree Compensations Slow rate;Small sips/bites;Chin tuck;Clear throat intermittently;Use straw to facilitate chin tuck Postural Changes --   CHL IP OTHER RECOMMENDATIONS 07/14/2018 Recommended Consults -- Oral Care Recommendations Oral care BID Other Recommendations --   CHL IP FOLLOW UP RECOMMENDATIONS 07/14/2018 Follow up Recommendations Skilled Nursing facility   [facility] IP FREQUENCY AND DURATION 07/14/2018 Speech Therapy Frequency (ACUTE ONLY) min 2x/week Treatment Duration 2 weeks      CHL IP ORAL PHASE 07/14/2018 Oral  Phase Impaired Oral - Pudding Teaspoon -- Oral - Pudding Cup -- Oral - Honey Teaspoon NT Oral - Honey Cup Weak lingual manipulation;Lingual pumping;Reduced posterior propulsion;Piecemeal swallowing;Decreased bolus cohesion;Premature spillage Oral - Nectar Teaspoon Weak lingual manipulation;Lingual pumping;Reduced posterior propulsion;Piecemeal swallowing;Decreased bolus cohesion;Premature spillage Oral - Nectar Cup Weak lingual manipulation;Lingual pumping;Reduced posterior propulsion;Piecemeal swallowing;Decreased bolus cohesion;Premature spillage Oral - Nectar Straw Weak lingual manipulation;Lingual pumping;Reduced posterior propulsion;Piecemeal swallowing;Delayed oral transit;Decreased bolus cohesion Oral - Thin Teaspoon Lingual pumping;Reduced posterior propulsion;Piecemeal swallowing;Decreased bolus cohesion;Premature spillage Oral - Thin Cup Weak lingual manipulation;Lingual pumping;Reduced posterior propulsion;Piecemeal swallowing;Decreased bolus cohesion;Premature spillage Oral - Thin Straw Weak lingual manipulation;Reduced posterior propulsion;Piecemeal swallowing;Decreased bolus cohesion;Premature spillage Oral -  Puree Weak lingual manipulation;Lingual pumping;Reduced posterior propulsion;Piecemeal swallowing;Decreased bolus cohesion;Premature spillage Oral - Mech Soft Impaired mastication;Weak lingual manipulation;Lingual pumping;Reduced posterior propulsion;Lingual/palatal residue;Piecemeal swallowing;Delayed oral transit;Premature spillage;Decreased bolus cohesion Oral - Regular -- Oral - Multi-Consistency -- Oral - Pill Other (Comment) Oral Phase - Comment --  CHL IP PHARYNGEAL PHASE 07/14/2018 Pharyngeal Phase Impaired Pharyngeal- Pudding Teaspoon -- Pharyngeal -- Pharyngeal- Pudding Cup -- Pharyngeal -- Pharyngeal- Honey Teaspoon -- Pharyngeal -- Pharyngeal- Honey Cup Penetration/Aspiration before swallow;Penetration/Aspiration during swallow Pharyngeal Material enters airway, remains ABOVE vocal  cords then ejected out;Material enters airway, remains ABOVE vocal cords and not ejected out Pharyngeal- Nectar Teaspoon -- Pharyngeal -- Pharyngeal- Nectar Cup Penetration/Aspiration before swallow;Penetration/Aspiration during swallow;Trace aspiration Pharyngeal Material does not enter airway;Material enters airway, CONTACTS cords and not ejected out;Material enters airway, passes BELOW cords without attempt by patient to eject out (silent aspiration) Pharyngeal- Nectar Straw Penetration/Aspiration before swallow;Penetration/Aspiration during swallow;Trace aspiration Pharyngeal Material enters airway, CONTACTS cords and not ejected out;Material enters airway, passes BELOW cords without attempt by patient to eject out (silent aspiration) Pharyngeal- Thin Teaspoon Penetration/Aspiration before swallow;Penetration/Aspiration during swallow;Moderate aspiration Pharyngeal Material enters airway, passes BELOW cords and not ejected out despite cough attempt by patient Pharyngeal- Thin Cup Penetration/Aspiration before swallow;Penetration/Aspiration during swallow;Moderate aspiration Pharyngeal Material does not enter airway;Material enters airway, CONTACTS cords and not ejected out;Material enters airway, passes BELOW cords and not ejected out despite cough attempt by patient;Material enters airway, passes BELOW cords without attempt by patient to eject out (silent aspiration) Pharyngeal- Thin Straw Penetration/Aspiration during swallow;Other (Comment) Pharyngeal Material does not enter airway;Material enters airway, remains ABOVE vocal cords then ejected out Pharyngeal- Puree WFL Pharyngeal -- Pharyngeal- Mechanical Soft WFL Pharyngeal -- Pharyngeal- Regular -- Pharyngeal -- Pharyngeal- Multi-consistency -- Pharyngeal -- Pharyngeal- Pill Other (Comment) Pharyngeal -- Pharyngeal Comment --  CHL IP CERVICAL ESOPHAGEAL PHASE 07/14/2018 Cervical Esophageal Phase WFL Pudding Teaspoon -- Pudding Cup -- Honey Teaspoon -- Honey  Cup -- Nectar Teaspoon -- Nectar Cup -- Nectar Straw -- Thin Teaspoon -- Thin Cup -- Thin Straw -- Puree -- Mechanical Soft -- Regular -- Multi-consistency -- Pill -- Cervical Esophageal Comment -- Deneise Lever, MS, CCC-SLP Speech-Language Pathologist Acute Rehabilitation Services Pager: [phone] Office: [phone] Aliene Altes 07/14/2018, 1:46 PM               Cardiac Studies   Echo 12/10/16: Study Conclusions  - Left ventricle: There was moderate concentric hypertrophy.   Systolic function was moderately to severely reduced. The   estimated ejection fraction was in the range of 30% to 35%.   Severe hypokinesis of the anteroseptal, inferoseptal, inferior,   and mid-apical inferolateral myocardium. Doppler parameters are   consistent with abnormal left ventricular relaxation (grade 1   diastolic dysfunction). Doppler parameters are consistent with   indeterminate ventricular filling pressure. - Aortic valve: A bioprosthesis was present. Peak velocity (S): 331   cm/s. Mean gradient (S): 30 mm Hg. Valve area (VTI): 1.09 cm^2.   Valve area (Vmax): 1.12 cm^2. Valve area (Vmean): 0.95 cm^2. - Mitral valve: Moderately calcified annulus. Transvalvular   velocity was within the normal range. There was no evidence for   stenosis. There was no regurgitation. - Left atrium: The atrium was severely dilated. - Right ventricle: The cavity size was normal. Wall thickness was   normal. Systolic function was normal. - Right atrium: The atrium was moderately dilated. - Tricuspid valve: There was mild regurgitation. - Pulmonary arteries: Systolic pressure was within the normal   range. PA  peak pressure: 27 mm Hg (S). - Impressions: Bioprosthetic aortic valve gradients are moderately   elevated and mildly increased from 05/2016 (26 mmHg to 30 mmHg).   Valve leaflets appear to have normal excursion. Given the peak   velocity >65ms, mean gradient 30 mmHg, dimentionless valve index   of 0.43 and  acceleration time of 137 ms, cannot rule out mild   prosthetic valve stensosis.  Impressions:  - Bioprosthetic aortic valve gradients are moderately elevated and   mildly increased from 05/2016 (26 mmHg to 30 mmHg). Valve leaflets   appear to have normal excursion. Given the peak velocity >330m,   mean gradient 30 mmHg, dimentionless valve index of 0.43 and   acceleration time of 137 ms, cannot rule out mild prosthetic   valve stensosis.  Patient Profile     Mr. MiLatas a [age]o male with HIV, DM, hypertension, alcoholism in remission, chronic systolic and diastolic heart failure s/p ICD, CVA, multiple myeloma and thrombocytopenia here with acute on chronic renal failure requiring HD, E. Coli bacteremia and encephalopathy.  Cardiology was consulted for atrial fibrillation/flutter with RVR.    Assessment & Plan    1. Atrial fibrillation with RVR: sinus today. He is not on anti-coagulation secondary to thrombocytopenia. His CHADS VASC score is 5. Would recommend consideration of Eliquis vs Warfarin before discharge. Will let the primary team and Nephrology decide which would be best. Continue Toprol.   2. Chronic systolic and diastolic heart failure: Volume management per HD. Not a candidate for Ace-inh/ARB therapy given renal function. He is not a cath candidate. No ischemic evaluation is recommended at this time. ICD in place.   Cardiology will sign off. No new recommendations. Please contact usKoreat time of discharge and we can arrange cardiology follow up with Dr. RaOval Linsey  For questions or updates, please contact [facility] [place] consult [email or web address] for contact info under   Signed, ChLauree ChandlerMD  07/16/2018, 8:59 AM

## 2018-07-17 DIAGNOSIS — I472 Ventricular tachycardia: Secondary | ICD-10-CM

## 2018-07-17 LAB — CBC WITH DIFFERENTIAL/PLATELET
ABS IMMATURE GRANULOCYTES: 0.09 10*3/uL — AB (ref 0.00–0.07)
BASOS ABS: 0 10*3/uL (ref 0.0–0.1)
Basophils Relative: 0 %
Eosinophils Absolute: 0 10*3/uL (ref 0.0–0.5)
Eosinophils Relative: 0 %
HCT: 37.3 % — ABNORMAL LOW (ref 39.0–52.0)
Hemoglobin: 11.8 g/dL — ABNORMAL LOW (ref 13.0–17.0)
Immature Granulocytes: 3 %
Lymphocytes Relative: 18 %
Lymphs Abs: 0.6 10*3/uL — ABNORMAL LOW (ref 0.7–4.0)
MCH: 28.4 pg (ref 26.0–34.0)
MCHC: 31.6 g/dL (ref 30.0–36.0)
MCV: 89.9 fL (ref 80.0–100.0)
Monocytes Absolute: 0.4 10*3/uL (ref 0.1–1.0)
Monocytes Relative: 11 %
Neutro Abs: 2.1 10*3/uL (ref 1.7–7.7)
Neutrophils Relative %: 68 %
Platelets: 103 10*3/uL — ABNORMAL LOW (ref 150–400)
RBC: 4.15 MIL/uL — ABNORMAL LOW (ref 4.22–5.81)
RDW: 14.6 % (ref 11.5–15.5)
WBC: 3.1 10*3/uL — ABNORMAL LOW (ref 4.0–10.5)
nRBC: 0 % (ref 0.0–0.2)

## 2018-07-17 LAB — BASIC METABOLIC PANEL
Anion gap: 13 (ref 5–15)
BUN: 95 mg/dL — ABNORMAL HIGH (ref 8–23)
CO2: 23 mmol/L (ref 22–32)
Calcium: 6.4 mg/dL — CL (ref 8.9–10.3)
Chloride: 103 mmol/L (ref 98–111)
Creatinine, Ser: 6 mg/dL — ABNORMAL HIGH (ref 0.61–1.24)
GFR calc non Af Amer: 9 mL/min — ABNORMAL LOW (ref >60)
GFR, EST AFRICAN AMERICAN: 10 mL/min — AB (ref >60)
Glucose, Bld: 114 mg/dL — ABNORMAL HIGH (ref 70–99)
Potassium: 3.5 mmol/L (ref 3.5–5.1)
Sodium: 139 mmol/L (ref 135–145)

## 2018-07-17 LAB — GLUCOSE, CAPILLARY
Glucose-Capillary: 102 mg/dL — ABNORMAL HIGH (ref 70–99)
Glucose-Capillary: 144 mg/dL — ABNORMAL HIGH (ref 70–99)
Glucose-Capillary: 155 mg/dL — ABNORMAL HIGH (ref 70–99)
Glucose-Capillary: 317 mg/dL — ABNORMAL HIGH (ref 70–99)
Glucose-Capillary: 98 mg/dL (ref 70–99)

## 2018-07-17 LAB — MAGNESIUM: Magnesium: 2.1 mg/dL (ref 1.7–2.4)

## 2018-07-17 MED ORDER — WARFARIN - PHARMACIST DOSING INPATIENT
Freq: Every day | Status: DC
  Administered 2018-07-17: 19:00:00

## 2018-07-17 MED ORDER — COUMADIN BOOK
Freq: Once | Status: AC
  Administered 2018-07-17: 19:00:00
  Filled 2018-07-17: qty 1

## 2018-07-17 MED ORDER — CALCIUM GLUCONATE-NACL 2-0.675 GM/100ML-% IV SOLN
2.0000 g | Freq: Once | INTRAVENOUS | Status: AC
  Administered 2018-07-17: 2000 mg via INTRAVENOUS
  Filled 2018-07-17: qty 100

## 2018-07-17 MED ORDER — HEPARIN SODIUM (PORCINE) 1000 UNIT/ML IJ SOLN
INTRAMUSCULAR | Status: AC
  Administered 2018-07-17: 2800 [IU]
  Filled 2018-07-17: qty 3

## 2018-07-17 MED ORDER — WARFARIN SODIUM 5 MG PO TABS
5.0000 mg | ORAL_TABLET | Freq: Once | ORAL | Status: AC
  Administered 2018-07-17: 5 mg via ORAL
  Filled 2018-07-17 (×2): qty 1

## 2018-07-17 MED ORDER — WARFARIN VIDEO: Freq: Once | Status: DC

## 2018-07-17 NOTE — Progress Notes (Signed)
Patient ID: Keith Young, male   DOB: 08-04-1950, 68 y.o.   MRN: [id number]  PROGRESS NOTE    Keith Young  DTO:[id number] DOB: 20-Oct-1950 DOA: 07/06/2018 PCP: Virgie Dad, MD   Brief Narrative:  68 year old African-American male with history of chronic systolic CHF with EF of 98% with AICD, CKD stage IV, diabetes mellitus type 2, COPD, hypertension, CVA, HIV presented with high fevers, altered mental status with nausea and vomiting on 07/06/2018 and was diagnosed with sepsis.  ID, neurology and nephrology were consulted.  He was initially started on broad-spectrum antibiotics.  Because of acute kidney injury and altered mental status, he was also started on dialysis.  He was found to have E. coli bacteremia.  He had LP done on 07/10/2018.   Assessment & Plan:   Principal Problem:   E coli bacteremia Active Problems:   Chronic kidney disease (CKD), active medical management without dialysis, stage 3 (moderate) (HCC)   Diabetes mellitus type 2 in nonobese [facility])   Essential hypertension, benign   ICD (implantable cardioverter-defibrillator) in place   Acute kidney injury ([place])   HIV disease ([place])   Chronic combined systolic and diastolic heart failure ([place])   Acute metabolic encephalopathy   Septic shock (HCC)   Encephalitis   ARF (acute renal failure) ([place])   Sepsis with encephalopathy and septic shock (HCC)   Tachycardia   Diverticulosis   Sepsis present on admission -Antibiotic plan as below.  Currently hemodynamically stable  E. coli bacteremia -Questionable cause.  ID has signed off.  ID recommends IV ceftazidime: Stop date is August 20, 2018 as per ID.  2D echo showed no vegetations.   -Repeat blood culture from 07/09/2018 is negative so far.  CT abdomen pelvis on 07/12/2018 shows diverticulosis without diverticulitis.  Acute metabolic/uremic encephalopathy -Initially there was a concern for meningitis but LP done on 07/10/2018 showed only 3 WBCs.  Hence patient does not  have meningitis.  Acyclovir and meningitis coverage were discontinued - Neurology has signed off; repeat CT of the head on 07/11/2018 showed no acute intracranial abnormality but showed chronic ischemic infarcts.  Patient cannot undergo MRI of the brain.  Keppra has been discontinued -Patient still slightly confused and slow to respond. -Diet as per SLP recommendations.  Acute kidney injury on chronic kidney disease stage IV -Baseline creatinine is around 2.5. -Creatinine 6.00 today.  Patient was started on dialysis on 07/09/2018.  Nephrology following.  Patient is receiving intermittent dialysis as per nephrology.  Nephrology is deciding on a day-to-day basis regarding need for dialysis.  Wheezing -Improving.  Continue prednisone 40 mg daily for 5 days total.  Continue Dulera.  HIV -Continue current antiretroviral regimen.  ID following  History of chronic systolic CHF with EF of 33%/ASNK -Currently volume being managed by dialysis which was started on 07/09/2018.  Continue metoprolol.  Cardiology following.  Strict input and output.  Daily weights  Essential hypertension -Blood pressure stable for now.  Continue metoprolol.  Monitor.  New onset paroxysmal A. fib with RVR with intermittent episodes of NSVT -Currently rate controlled.  Patient was on heparin drip.  I discontinued the heparin drip on 07/11/2018 because of thrombocytopenia; cardiology following.  Continue metoprolol.  -We will start Coumadin today as platelet is 103 today. -We will consult cardiology as patient is having frequent episodes of NSVT.  Diabetes mellitus type 2 -Continue Accu-Cheks with coverage.  Hemoglobin A1c was 6.5 on 05/22/2018  Thrombocytopenia -Questionable cause.  Platelet 103 today.  Will start  Coumadin.  No signs of bleeding   DVT prophylaxis: SCDs Code Status: DNR Family Communication: None at bedside Disposition Plan: Depends on clinical outcome  Consultants:  Nephrology/neurology/ID/IR/cardiology  Procedures: IR guided HD catheter placement on 07/09/2018 LP on 07/10/2018 by IR  Antimicrobials:  Anti-infectives (From admission, onward)   Start     Dose/Rate Route Frequency Ordered Stop   07/12/18 2200  cefTAZidime (FORTAZ) 1 g in sodium chloride 0.9 % 100 mL IVPB     1 g 200 mL/hr over 30 Minutes Intravenous Every 24 hours 07/12/18 1449 07/22/18 2359   07/10/18 1430  vancomycin (VANCOCIN) IVPB 1000 mg/200 mL premix  Status:  Discontinued     1,000 mg 200 mL/hr over 60 Minutes Intravenous Every 48 hours 07/08/18 1421 07/09/18 0959   07/10/18 1000  cefTRIAXone (ROCEPHIN) 2 g in sodium chloride 0.9 % 100 mL IVPB  Status:  Discontinued     2 g 200 mL/hr over 30 Minutes Intravenous Every 24 hours 07/10/18 0942 07/12/18 1433   07/10/18 0847  cefTRIAXone (ROCEPHIN) 2 g in sodium chloride 0.9 % 100 mL IVPB  Status:  Discontinued     2 g 200 mL/hr over 30 Minutes Intravenous Every 24 hours 07/09/18 1004 07/10/18 0933   07/09/18 1400  acyclovir (ZOVIRAX) 745 mg in dextrose 5 % 150 mL IVPB  Status:  Discontinued     10 mg/kg  74.5 kg 164.9 mL/hr over 60 Minutes Intravenous Every 24 hours 07/08/18 1421 07/09/18 0959   07/09/18 1300  bictegravir-emtricitabine-tenofovir AF (BIKTARVY) 50-200-25 MG per tablet 1 tablet     1 tablet Oral Daily 07/09/18 1012     07/08/18 2300  cefTRIAXone (ROCEPHIN) 2 g in sodium chloride 0.9 % 100 mL IVPB  Status:  Discontinued     2 g 200 mL/hr over 30 Minutes Intravenous Every 12 hours 07/08/18 1341 07/09/18 1004   07/08/18 1800  vancomycin (VANCOCIN) IVPB 1000 mg/200 mL premix  Status:  Discontinued     1,000 mg 200 mL/hr over 60 Minutes Intravenous Every 48 hours 07/06/18 2019 07/07/18 1137   07/08/18 1430  ampicillin (OMNIPEN) 2 g in sodium chloride 0.9 % 100 mL IVPB  Status:  Discontinued     2 g 300 mL/hr over 20 Minutes Intravenous Every 12 hours 07/08/18 1343 07/09/18 0959   07/08/18 1430  vancomycin (VANCOCIN)  1,500 mg in sodium chloride 0.9 % 500 mL IVPB     1,500 mg 250 mL/hr over 120 Minutes Intravenous  Once 07/08/18 1421 07/08/18 1723   07/08/18 1400  acyclovir (ZOVIRAX) 745 mg in dextrose 5 % 150 mL IVPB     10 mg/kg  74.5 kg 164.9 mL/hr over 60 Minutes Intravenous  Once 07/08/18 1345 07/09/18 1315   07/07/18 1800  ceFEPIme (MAXIPIME) 1 g in sodium chloride 0.9 % 100 mL IVPB  Status:  Discontinued     1 g 200 mL/hr over 30 Minutes Intravenous Every 24 hours 07/06/18 2019 07/07/18 1137   07/07/18 1145  cefTRIAXone (ROCEPHIN) 2 g in sodium chloride 0.9 % 100 mL IVPB  Status:  Discontinued     2 g 200 mL/hr over 30 Minutes Intravenous Every 24 hours 07/07/18 1137 07/08/18 1341   07/07/18 1000  bictegravir-emtricitabine-tenofovir AF (BIKTARVY) 50-200-25 MG per tablet 1 tablet  Status:  Discontinued     1 tablet Oral Daily 07/07/18 0058 07/07/18 1127   07/06/18 1900  ceFEPIme (MAXIPIME) 2 g in sodium chloride 0.9 % 100 mL IVPB  2 g 200 mL/hr over 30 Minutes Intravenous  Once 07/06/18 1859 07/06/18 2001   07/06/18 1900  vancomycin (VANCOCIN) IVPB 1000 mg/200 mL premix     1,000 mg 200 mL/hr over 60 Minutes Intravenous  Once 07/06/18 1859 07/06/18 2035       Subjective: Patient seen and examined at bedside.  No overnight fever or vomiting reported by nursing staff.  Nursing staff reports frequent episodes of NSVT.  Patient is slightly awake, very slow to respond to questions and does not answer much questions.  reported.   Objective: Vitals:   07/16/18 2107 07/17/18 0122 07/17/18 0504 07/17/18 0728  BP: (!) 158/80 132/79 138/86   Pulse: 70 62 63   Resp:      Temp: 98.8 F (37.1 C)     TempSrc: Oral     SpO2: 95% 99% 98% 98%  Weight:      Height:        Intake/Output Summary (Last 24 hours) at 07/17/2018 1053 Last data filed at 07/17/2018 1041 Gross per 24 hour  Intake 100 ml  Output 925 ml  Net -825 ml   Filed Weights   07/13/18 1542 07/14/18 0528 07/15/18 0656  Weight:  74.2 kg 72.9 kg 73.6 kg    Examination:  General exam: No acute distress.  More awake, very slow to respond, does not answer much questions  respiratory system: Bilateral decreased breath sounds at bases with scattered crackles  Cardiovascular system: S1-S2 heard, rate controlled Gastrointestinal system: Abdomen is nondistended, soft and nontender. Normal bowel sounds heard. Extremities: Bilateral lower extremity edema.  No cyanosis   Data Reviewed: I have personally reviewed following labs and imaging studies  CBC: Recent Labs  Lab 07/13/18 0337 07/14/18 0531 07/15/18 0530 07/16/18 0430 07/17/18 0416  WBC 3.3* 4.3 4.3 2.8* 3.1*  NEUTROABS 2.3 3.2 3.0 2.0 2.1  HGB 13.2 12.7* 12.4* 11.3* 11.8*  HCT 41.9 41.3 38.4* 35.4* 37.3*  MCV 91.7 90.2 90.4 90.1 89.9  PLT 71* 68* 82* 89* 188*   Basic Metabolic Panel: Recent Labs  Lab 07/12/18 0336 07/13/18 0337 07/14/18 0531 07/15/18 0530 07/16/18 0430 07/17/18 0416  NA 142 143 138 138 139 139  K 4.3 4.7 4.4 3.9 3.7 3.5  CL 108 109 104 104 104 103  CO2 25 24 23 27 22 23   GLUCOSE 91 161* 157* 115* 135* 114*  BUN 44* 72* 53* 77* 87* 95*  CREATININE 4.10* 5.74* 4.43* 5.55* 5.94* 6.00*  CALCIUM 7.3* 7.4* 7.1* 6.7* 6.4* 6.4*  MG 1.8 2.1 2.1  --  2.2 2.1   GFR: Estimated Creatinine Clearance: 10.9 mL/min (A) (by C-G formula based on SCr of 6 mg/dL (H)). Liver Function Tests: Recent Labs  Lab 07/16/18 0430  AST 31  ALT 36  ALKPHOS 62  BILITOT 0.5  PROT 5.5*  ALBUMIN 2.0*   No results for input(s): LIPASE, AMYLASE in the last 168 hours. No results for input(s): AMMONIA in the last 168 hours. Coagulation Profile: No results for input(s): INR, PROTIME in the last 168 hours. Cardiac Enzymes: No results for input(s): CKTOTAL, CKMB, CKMBINDEX, TROPONINI in the last 168 hours. BNP (last 3 results) No results for input(s): PROBNP in the last 8760 hours. HbA1C: No results for input(s): HGBA1C in the last 72  hours. CBG: Recent Labs  Lab 07/16/18 1953 07/16/18 2343 07/17/18 0009 07/17/18 0347 07/17/18 0752  GLUCAP 251* 165* 144* 102* 98   Lipid Profile: No results for input(s): CHOL, HDL, LDLCALC, TRIG, CHOLHDL, LDLDIRECT  in the last 72 hours. Thyroid Function Tests: No results for input(s): TSH, T4TOTAL, FREET4, T3FREE, THYROIDAB in the last 72 hours. Anemia Panel: No results for input(s): VITAMINB12, FOLATE, FERRITIN, TIBC, IRON, RETICCTPCT in the last 72 hours. Sepsis Labs: No results for input(s): PROCALCITON, LATICACIDVEN in the last 168 hours.  Recent Results (from the past 240 hour(s))  MRSA PCR Screening     Status: None   Collection Time: 07/07/18  3:24 PM  Result Value Ref Range Status   MRSA by PCR NEGATIVE NEGATIVE Final    Comment:        The GeneXpert MRSA Assay (FDA approved for NASAL specimens only), is one component of a comprehensive MRSA colonization surveillance program. It is not intended to diagnose MRSA infection nor to guide or monitor treatment for MRSA infections. Performed at [facility], [place] [address], [place], [place] [address]   Culture, Urine     Status: None   Collection Time: 07/08/18  7:47 AM  Result Value Ref Range Status   Specimen Description URINE, RANDOM  Final   Special Requests NONE  Final   Culture   Final    NO GROWTH Performed at [facility], [place] [address] [place], [place] [address]    Report Status 07/09/2018 FINAL  Final  Culture, blood (Routine X 2) w Reflex to ID Panel     Status: None   Collection Time: 07/09/18 10:18 AM  Result Value Ref Range Status   Specimen Description BLOOD RIGHT HAND  Final   Special Requests   Final    BOTTLES DRAWN AEROBIC AND ANAEROBIC Blood Culture adequate volume   Culture   Final    NO GROWTH 5 DAYS Performed at [facility], [place] [address], [place], [place] [address]    Report Status 07/14/2018 FINAL  Final  Culture, blood (Routine X 2) w Reflex to ID Panel      Status: None   Collection Time: 07/09/18 10:25 AM  Result Value Ref Range Status   Specimen Description BLOOD LEFT HAND  Final   Special Requests   Final    BOTTLES DRAWN AEROBIC AND ANAEROBIC Blood Culture adequate volume   Culture   Final    NO GROWTH 5 DAYS Performed at [facility], [place] [address] [place], [place] [address]    Report Status 07/14/2018 FINAL  Final  C difficile quick scan w PCR reflex     Status: Abnormal   Collection Time: 07/09/18 10:38 AM  Result Value Ref Range Status   C Diff antigen POSITIVE (A) NEGATIVE Final   C Diff toxin NEGATIVE NEGATIVE Final   C Diff interpretation Results are indeterminate. See PCR results.  Final    Comment: Performed at [facility], [place] [address] [place], [place] [address]  C. Diff by PCR, Reflexed     Status: Abnormal   Collection Time: 07/09/18 10:38 AM  Result Value Ref Range Status   Toxigenic C. Difficile by PCR POSITIVE (A) NEGATIVE Final    Comment: Positive for toxigenic C. difficile with little to no toxin production. Only treat if clinical presentation suggests symptomatic illness. Performed at [facility], [place] [address], [place], [place] [address]   CSF culture     Status: None   Collection Time: 07/10/18  2:44 PM  Result Value Ref Range Status   Specimen Description CSF  Final   Special Requests NONE  Final   Gram Stain  Final    WBC PRESENT,BOTH PMN AND MONONUCLEAR NO ORGANISMS SEEN CYTOSPIN SMEAR    Culture   Final    NO GROWTH 3 DAYS Performed at [facility], [place] [address], [place], [place] [address]    Report Status 07/13/2018 FINAL  Final  Fungus Culture With Stain     Status: None (Preliminary result)   Collection Time: 07/10/18  2:44 PM  Result Value Ref Range Status   Fungus Stain Final report  Final    Comment: (NOTE) Performed At: [facility] [place], [place] [id number] Rush Farmer MD [id number]    Fungus (Mycology) Culture PENDING   Incomplete   Fungal Source CSF  Final  Fungus Culture Result     Status: None   Collection Time: 07/10/18  2:44 PM  Result Value Ref Range Status   Result 1 Comment  Final    Comment: (NOTE) KOH/Calcofluor preparation:  no fungus observed. Performed At: [facility] [place], [place] [id number] Rush Farmer MD TW:[id number]          Radiology Studies: No results found.      Scheduled Meds: . bictegravir-emtricitabine-tenofovir AF  1 tablet Oral Daily  . chlorhexidine  15 mL Mouth Rinse BID  . Chlorhexidine Gluconate Cloth  6 each Topical Q0600  . insulin aspart  0-9 Units Subcutaneous Q4H  . mouth rinse  15 mL Mouth Rinse q12n4p  . metoprolol succinate  25 mg Oral Daily  . mometasone-formoterol  2 puff Inhalation BID  . predniSONE  40 mg Oral Q breakfast   Continuous Infusions: . sodium chloride Stopped (07/15/18 0106)  . cefTAZidime (FORTAZ)  IV 1 g (07/16/18 2233)     LOS: 10 days        Aline August, MD [facility] Pager [phone]  If 7PM-7AM, please contact night-coverage [email or web address] Password [facility] 07/17/2018, 10:53 AM

## 2018-07-17 NOTE — Progress Notes (Signed)
CRITICAL VALUE ALERT  Critical Value:  Calcium 6.4  Date & Time Notied:  07/17/18 at 0600  Provider Notified: Schorr,NP  Orders Received/Actions taken: Spoke with Schorr,NP and she was going to place orders

## 2018-07-17 NOTE — Progress Notes (Addendum)
Telemetry called to inform RN that patient had a 27-beat run of V-Tach followed by another 5 beat run of V-Tach. Patient was asymptomatic and vitals were stable. Schorr,NP and Mindy, Rapid Response RN notified of situation. Schorr,NP viewed labs and ordered a one time order of IV calcium gluconate. Will continue to monitor and treat per MD orders.

## 2018-07-17 NOTE — Progress Notes (Signed)
This RN began giving HD RN Dane report about this patient. During report she said to call them back after patient had received his IV dose of calcium gluconate. Awaiting for pharmacy to send up medication at this time. Will make oncoming dayshift RN aware of situation.

## 2018-07-17 NOTE — Progress Notes (Signed)
Pt had a 7-beat run of idioventricular vtach. Pt was asymptomatic and vital signs stable. Schorr,NP notified. No new orders at this time. Will continue to monitor and treat per MD orders.

## 2018-07-17 NOTE — Progress Notes (Signed)
[facility]   SUBJECTIVE:    VTach overnight  Ca 6.4, albumin 2.0  0.5L UOP yesterday  Pt awake, calm ,not very forthcoming  OBJECTIVE:  Vitals:   07/17/18 0504 07/17/18 0728  BP: 138/86   Pulse: 63   Resp:    Temp:    SpO2: 98% 98%    Intake/Output Summary (Last 24 hours) at 07/17/2018 1117 Last data filed at 07/17/2018 1041 Gross per 24 hour  Intake 100 ml  Output 925 ml  Net -825 ml      Genearl: comfortably laying at 30 deg, arouses to voice, localizes, answers simple questions. Neck:  RIJ temp cath c/d/i CV:  RRR  No rub Lungs CTAB Abd:  abd SNT/ND with normal BS GU:  Bladder non-palpable, Foley catheter in place Extremities:  No LE edema. Skin:  No skin rash   MEDICATIONS:  . bictegravir-emtricitabine-tenofovir AF  1 tablet Oral Daily  . chlorhexidine  15 mL Mouth Rinse BID  . Chlorhexidine Gluconate Cloth  6 each Topical Q0600  . insulin aspart  0-9 Units Subcutaneous Q4H  . mouth rinse  15 mL Mouth Rinse q12n4p  . metoprolol succinate  25 mg Oral Daily  . mometasone-formoterol  2 puff Inhalation BID  . predniSONE  40 mg Oral Q breakfast       LABS:   CBC Latest Ref Rng & Units 07/17/2018 07/16/2018 07/15/2018  WBC 4.0 - 10.5 K/uL 3.1(L) 2.8(L) 4.3  Hemoglobin 13.0 - 17.0 g/dL 11.8(L) 11.3(L) 12.4(L)  Hematocrit 39.0 - 52.0 % 37.3(L) 35.4(L) 38.4(L)  Platelets 150 - 400 K/uL 103(L) 89(L) 82(L)    CMP Latest Ref Rng & Units 07/17/2018 07/16/2018 07/15/2018  Glucose 70 - 99 mg/dL 114(H) 135(H) 115(H)  BUN 8 - 23 mg/dL 95(H) 87(H) 77(H)  Creatinine 0.61 - 1.24 mg/dL 6.00(H) 5.94(H) 5.55(H)  Sodium 135 - 145 mmol/L 139 139 138  Potassium 3.5 - 5.1 mmol/L 3.5 3.7 3.9  Chloride 98 - 111 mmol/L 103 104 104  CO2 22 - 32 mmol/L 23 22 27   Calcium 8.9 - 10.3 mg/dL 6.4(LL) 6.4(LL) 6.7(L)  Total Protein 6.5 - 8.1 g/dL - 5.5(L) -  Total Bilirubin 0.3 - 1.2 mg/dL - 0.5 -  Alkaline Phos 38 - 126 U/L - 62 -  AST 15 - 41 U/L - 31 -  ALT 0 - 44  U/L - 36 -    Lab Results  Component Value Date   PTH 52 04/17/2018   CALCIUM 6.4 (LL) 07/17/2018   CAION 1.27 (H) 11/28/2015   PHOS 3.4 04/17/2018       Component Value Date/Time   COLORURINE AMBER (A) 07/08/2018 0839   APPEARANCEUR CLOUDY (A) 07/08/2018 0839   LABSPEC 1.016 07/08/2018 0839   PHURINE 5.0 07/08/2018 0839   GLUCOSEU NEGATIVE 07/08/2018 0839   HGBUR SMALL (A) 07/08/2018 0839   BILIRUBINUR NEGATIVE 07/08/2018 0839   KETONESUR 5 (A) 07/08/2018 0839   PROTEINUR 100 (A) 07/08/2018 0839   UROBILINOGEN 0.2 10/23/2013 1545   NITRITE NEGATIVE 07/08/2018 0839   LEUKOCYTESUR LARGE (A) 07/08/2018 0839      Component Value Date/Time   PHART 7.346 (L) 07/08/2018 1235   PCO2ART 32.0 07/08/2018 1235   PO2ART 86.4 07/08/2018 1235   HCO3 17.0 (L) 07/08/2018 1235   TCO2 25 11/28/2015 1304   ACIDBASEDEF 7.5 (H) 07/08/2018 1235   O2SAT 95.9 07/08/2018 1235       Component Value Date/Time   IRON 40 (L) 04/17/2018 0734   TIBC  260 04/17/2018 0734   FERRITIN 39 04/17/2018 0734   IRONPCTSAT 15 (L) 04/17/2018 0734       ASSESSMENT/PLAN:      1.  Acute on chronic stage IV renal disease; probable baseline renal function 2.4-2.8 and etiology is hypertension, diabetes.   Acute insult likely ATN during E. coli sepsis.  Started HD 2/10.  Today 3.5Ca 4K 1-2L UF, Tight heparin.  Has temporary HD catheter at the current time.    2.  Sepsis secondary to E coli in urine and blood.  Continue antibiotics per ID - plan 2 wk course.  TEE Ok.  CT A/P showed diverticula.   3.  Metabolic encephalopathy.  Likely secondary to sepsis complicated by uremia.  LP not c/w infection.  Improving daily, slow process though.   4.  Hyperkalemia.  resolved  5.  Bioprosthetic AVR, Chronic dual HF with EF , A fib with RVR:  Cardiology following.  Anticoagulation on hold in light of thrombocytopenia, rec start when able  7. HIV: on biktarvy   Pearson Grippe MD

## 2018-07-17 NOTE — Significant Event (Signed)
Rapid Response Event Note  Overview:Called d/t pt having frequent runs vtach. Pt is DNR. Time Called: 0504 Arrival Time: 0512 Event Type: Cardiac  Initial Focused Assessment: DNR pt having frequent runs vtach. SBP-130s. Neuro status unchanged from previous assessment. CBC/BMET drawn. Mg-2.1, K-3.5. Orders to replete K.  Interventions: CBC/BMET  Plan of Care (if not transferred): Replete K per orders.  Continue to monitor pt. Call RRT if further assistance needed. Event Summary: Name of Physician Notified: Schorr, NP at [phone]    at    Outcome: Stayed in room and stabalized  Event End Time: 0625  Dillard [place]

## 2018-07-17 NOTE — Progress Notes (Signed)
ANTICOAGULATION CONSULT NOTE - Initial Consult  Pharmacy Consult for Coumadin Indication: atrial fibrillation  No Known Allergies  Patient Measurements: Height: 5\' 3"  (160 cm) Weight: 162 lb 0.6 oz (73.5 kg) IBW/kg (Calculated) : 56.9  Vital Signs: Temp: 98.2 F (36.8 C) (02/18 1231) Temp Source: Oral (02/18 1231) BP: 130/83 (02/18 1330) Pulse Rate: 72 (02/18 1330)  Labs: Recent Labs    07/15/18 0530 07/16/18 0430 07/17/18 0416  HGB 12.4* 11.3* 11.8*  HCT 38.4* 35.4* 37.3*  PLT 82* 89* 103*  CREATININE 5.55* 5.94* 6.00*    Estimated Creatinine Clearance: 10.9 mL/min (A) (by C-G formula based on SCr of 6 mg/dL (H)).   Medical History: Past Medical History:  Diagnosis Date  . AICD (automatic cardioverter/defibrillator) present   . Alcoholism in remission ([place])   . Anemia   . Aortic insufficiency   . Arthritis   . At moderate risk for fall   . Bell's palsy   . Chronic systolic heart failure (HCC)    NYHA Class III  . CKD (chronic kidney disease), stage IV ([place])   . COPD (chronic obstructive pulmonary disease) ([place])   . Essential hypertension, benign   . HIV disease ([place]) 09/06/2016  . Hyperlipidemia   . NICM (nonischemic cardiomyopathy) ([place])   . Noncompliance with medication regimen   . NSVT (nonsustained ventricular tachycardia) ([place])   . Numbness of right jaw    Had a stoke in 01/2013. Numbness is occasional, especially when trying to chew.  . OSA (obstructive sleep apnea)   . Productive cough 08/2013   With brown sputum   . S/P aortic valve replacement with bioprosthetic valve   . Stroke ([place]) 01/2013   weakness of right side from CVA  . Type 2 diabetes mellitus ([place])   . Uses hearing aid 2014   recently received new hearing aids     Assessment: Anticoag:  None PTA. s/p IV heparin for new onset AFib with RVR, CHA2DS2-Vasc score 5 >>anticoag stopped due to thrombocytopenia. Coumadin 2/18 5mg  po x 1. Hgb 11.8. Plts 103 rising   Goal of Therapy:   INR 2-3 Monitor platelets by anticoagulation protocol: Yes   Plan:  - Coumadin 5mg  po x 1 tonight. Daily INR - f/u to teach patient when encephalopathy improved  Shaneya Taketa S. Alford Highland, PharmD, [place] Clinical Staff Pharmacist Eilene Ghazi [place] 07/17/2018,2:14 PM

## 2018-07-17 NOTE — Progress Notes (Signed)
PT Cancellation Note  Patient Details Name: KELE BARTHELEMY MRN: [id number] DOB: 09/16/1950   Cancelled Treatment:    Reason Eval/Treat Not Completed: (P) Patient at procedure or test/unavailable(Off unit for HD will defer PT and f/u per POC.  )   Shabreka Coulon Eli Hose 07/17/2018, 4:00 PM  [name], PTA Acute Rehabilitation Services Pager [phone] Office [phone]

## 2018-07-18 ENCOUNTER — Inpatient Hospital Stay (HOSPITAL_COMMUNITY): Payer: Medicare HMO

## 2018-07-18 DIAGNOSIS — Z01810 Encounter for preprocedural cardiovascular examination: Secondary | ICD-10-CM

## 2018-07-18 LAB — BASIC METABOLIC PANEL
ANION GAP: 10 (ref 5–15)
BUN: 48 mg/dL — ABNORMAL HIGH (ref 8–23)
CO2: 25 mmol/L (ref 22–32)
Calcium: 7 mg/dL — ABNORMAL LOW (ref 8.9–10.3)
Chloride: 105 mmol/L (ref 98–111)
Creatinine, Ser: 3.74 mg/dL — ABNORMAL HIGH (ref 0.61–1.24)
GFR calc non Af Amer: 16 mL/min — ABNORMAL LOW (ref >60)
GFR, EST AFRICAN AMERICAN: 18 mL/min — AB (ref >60)
Glucose, Bld: 93 mg/dL (ref 70–99)
POTASSIUM: 3.7 mmol/L (ref 3.5–5.1)
Sodium: 140 mmol/L (ref 135–145)

## 2018-07-18 LAB — CBC WITH DIFFERENTIAL/PLATELET
ABS IMMATURE GRANULOCYTES: 0.05 10*3/uL (ref 0.00–0.07)
BASOS ABS: 0 10*3/uL (ref 0.0–0.1)
Basophils Relative: 0 %
Eosinophils Absolute: 0 10*3/uL (ref 0.0–0.5)
Eosinophils Relative: 0 %
HCT: 38.7 % — ABNORMAL LOW (ref 39.0–52.0)
Hemoglobin: 12.2 g/dL — ABNORMAL LOW (ref 13.0–17.0)
IMMATURE GRANULOCYTES: 1 %
Lymphocytes Relative: 9 %
Lymphs Abs: 0.7 10*3/uL (ref 0.7–4.0)
MCH: 28.2 pg (ref 26.0–34.0)
MCHC: 31.5 g/dL (ref 30.0–36.0)
MCV: 89.6 fL (ref 80.0–100.0)
Monocytes Absolute: 0.5 10*3/uL (ref 0.1–1.0)
Monocytes Relative: 7 %
NRBC: 0 % (ref 0.0–0.2)
Neutro Abs: 5.7 10*3/uL (ref 1.7–7.7)
Neutrophils Relative %: 83 %
Platelets: 127 10*3/uL — ABNORMAL LOW (ref 150–400)
RBC: 4.32 MIL/uL (ref 4.22–5.81)
RDW: 14.4 % (ref 11.5–15.5)
WBC: 6.9 10*3/uL (ref 4.0–10.5)

## 2018-07-18 LAB — GLUCOSE, CAPILLARY
Glucose-Capillary: 137 mg/dL — ABNORMAL HIGH (ref 70–99)
Glucose-Capillary: 256 mg/dL — ABNORMAL HIGH (ref 70–99)
Glucose-Capillary: 273 mg/dL — ABNORMAL HIGH (ref 70–99)
Glucose-Capillary: 318 mg/dL — ABNORMAL HIGH (ref 70–99)
Glucose-Capillary: 58 mg/dL — ABNORMAL LOW (ref 70–99)
Glucose-Capillary: 78 mg/dL (ref 70–99)
Glucose-Capillary: 97 mg/dL (ref 70–99)

## 2018-07-18 LAB — MAGNESIUM: Magnesium: 1.9 mg/dL (ref 1.7–2.4)

## 2018-07-18 LAB — PROTIME-INR
INR: 1.12
Prothrombin Time: 14.3 seconds (ref 11.4–15.2)

## 2018-07-18 MED ORDER — INSULIN ASPART 100 UNIT/ML ~~LOC~~ SOLN
0.0000 [IU] | Freq: Three times a day (TID) | SUBCUTANEOUS | Status: DC
  Administered 2018-07-19: 5 [IU] via SUBCUTANEOUS
  Administered 2018-07-19: 2 [IU] via SUBCUTANEOUS
  Administered 2018-07-21: 5 [IU] via SUBCUTANEOUS
  Administered 2018-07-21 – 2018-07-22 (×2): 3 [IU] via SUBCUTANEOUS
  Administered 2018-07-22: 5 [IU] via SUBCUTANEOUS
  Administered 2018-07-23 – 2018-07-24 (×2): 2 [IU] via SUBCUTANEOUS
  Administered 2018-07-24 – 2018-07-25 (×2): 3 [IU] via SUBCUTANEOUS
  Administered 2018-07-25: 2 [IU] via SUBCUTANEOUS

## 2018-07-18 MED ORDER — WARFARIN SODIUM 5 MG PO TABS: 5.0000 mg | ORAL_TABLET | Freq: Once | ORAL | Status: DC

## 2018-07-18 MED ORDER — ORAL CARE MOUTH RINSE
15.0000 mL | Freq: Two times a day (BID) | OROMUCOSAL | Status: DC
  Administered 2018-07-18 – 2018-07-25 (×12): 15 mL via OROMUCOSAL

## 2018-07-18 NOTE — Progress Notes (Signed)
Physical Therapy Treatment Patient Details Name: Keith Young MRN: [id number] DOB: Apr 27, 1951 Today's Date: 07/18/2018    History of Present Illness Patient is a 68 year old male admitted with acute mental status change and lethargy, septic shock  According to chart patient lives in facility. PMH to include: CHF, CKD, DM, HTN, HIV, COPD, OA     PT Comments    Pt performed LE supine exercises with good tolerance and response to 1 step commands.  Pt progressed to sitting edge of bed and standing into sara stedy sit to stand lift.  He required moderate assistance to achieve standing with good tolerance to accepting weight through B LEs.  Plan for SNF remains appropriate for short term rehab to improve strength and function.  Plan next session for progression to sit to stand with RW.     Follow Up Recommendations  SNF     Equipment Recommendations  None recommended by PT    Recommendations for Other Services       Precautions / Restrictions Precautions Precautions: Fall Restrictions Weight Bearing Restrictions: No    Mobility  Bed Mobility Overal bed mobility: Needs Assistance Bed Mobility: Supine to Sit Rolling: Mod assist         General bed mobility comments: Pt required assistance to advance LEs to edge of bed and to elevate trunk into sitting.  Pt presents with stook incontinence upon sitting.    Transfers Overall transfer level: Needs assistance Equipment used: (sara stedy) Transfers: Sit to/from Stand Sit to Stand: Mod assist;+2 safety/equipment         General transfer comment: Pt performed sit to stand from elevate surface of bed with cues for hand placement on sara stedy cross bar and cues to pull with UEs and push with LEs to achieve standing.    Ambulation/Gait                 Stairs             Wheelchair Mobility    Modified Rankin (Stroke Patients Only)       Balance Overall balance assessment: Needs assistance   Sitting  balance-Leahy Scale: Poor Sitting balance - Comments: posterior lean intially due to posterior pelvic tilt.       Standing balance-Leahy Scale: Poor                              Cognition Arousal/Alertness: Awake/alert Behavior During Therapy: WFL for tasks assessed/performed Overall Cognitive Status: No family/caregiver present to determine baseline cognitive functioning                                 General Comments: Pt nods in understanding of simple commands       Exercises General Exercises - Lower Extremity Ankle Circles/Pumps: AROM;Both;10 reps;Supine Quad Sets: AROM;Both;10 reps;Supine Heel Slides: AROM;Both;10 reps;Supine Hip ABduction/ADduction: AROM;10 reps;Both;Supine Straight Leg Raises: AROM;10 reps;Both;Supine    General Comments        Pertinent Vitals/Pain Pain Assessment: No/denies pain Faces Pain Scale: No hurt    Home Living                      Prior Function            PT Goals (current goals can now be found in the care plan section) Acute Rehab PT Goals Patient Stated Goal: none  stated Potential to Achieve Goals: Fair Progress towards PT goals: Progressing toward goals    Frequency    Min 2X/week      PT Plan Current plan remains appropriate    Co-evaluation              AM-PAC PT "6 Clicks" Mobility   Outcome Measure  Help needed turning from your back to your side while in a flat bed without using bedrails?: A Lot Help needed moving from lying on your back to sitting on the side of a flat bed without using bedrails?: A Lot Help needed moving to and from a bed to a chair (including a wheelchair)?: A Lot Help needed standing up from a chair using your arms (e.g., wheelchair or bedside chair)?: A Lot Help needed to walk in hospital room?: A Lot Help needed climbing 3-5 steps with a railing? : Total 6 Click Score: 11    End of Session Equipment Utilized During Treatment: Gait  belt Activity Tolerance: Patient limited by fatigue;Patient limited by lethargy Patient left: in bed;with bed alarm set;with call bell/phone within reach Nurse Communication: Mobility status PT Visit Diagnosis: Muscle weakness (generalized) (M62.81)     Time: 8675-4492 PT Time Calculation (min) (ACUTE ONLY): 32 min  Charges:  $Therapeutic Exercise: 8-22 mins $Therapeutic Activity: 8-22 mins                     Keith Young, PTA Acute Rehabilitation Services Pager [phone] Office [phone]     Keith Young Keith Young 07/18/2018, 5:42 PM

## 2018-07-18 NOTE — Progress Notes (Signed)
ANTICOAGULATION CONSULT NOTE - F/U Consult  Pharmacy Consult for Coumadin Indication: atrial fibrillation  No Known Allergies  Patient Measurements: Height: 5\' 3"  (160 cm) Weight: 162 lb 0.6 oz (73.5 kg) IBW/kg (Calculated) : 56.9  Vital Signs: Temp: 98.4 F (36.9 C) (02/19 0444) Temp Source: Oral (02/19 0444) BP: 135/77 (02/19 0444) Pulse Rate: 75 (02/19 0444)  Labs: Recent Labs    07/16/18 0430 07/17/18 0416 07/18/18 0341  HGB 11.3* 11.8* 12.2*  HCT 35.4* 37.3* 38.7*  PLT 89* 103* 127*  LABPROT  --   --  14.3  INR  --   --  1.12  CREATININE 5.94* 6.00* 3.74*    Estimated Creatinine Clearance: 17.5 mL/min (A) (by C-G formula based on SCr of 3.74 mg/dL (H)).   Medical History: Past Medical History:  Diagnosis Date  . AICD (automatic cardioverter/defibrillator) present   . Alcoholism in remission ([place])   . Anemia   . Aortic insufficiency   . Arthritis   . At moderate risk for fall   . Bell's palsy   . Chronic systolic heart failure (HCC)    NYHA Class III  . CKD (chronic kidney disease), stage IV ([place])   . COPD (chronic obstructive pulmonary disease) ([place])   . Essential hypertension, benign   . HIV disease ([place]) 09/06/2016  . Hyperlipidemia   . NICM (nonischemic cardiomyopathy) ([place])   . Noncompliance with medication regimen   . NSVT (nonsustained ventricular tachycardia) ([place])   . Numbness of right jaw    Had a stoke in 01/2013. Numbness is occasional, especially when trying to chew.  . OSA (obstructive sleep apnea)   . Productive cough 08/2013   With brown sputum   . S/P aortic valve replacement with bioprosthetic valve   . Stroke ([place]) 01/2013   weakness of right side from CVA  . Type 2 diabetes mellitus ([place])   . Uses hearing aid 2014   recently received new hearing aids     Assessment: Anticoag:  None PTA. s/p IV heparin for new onset AFib with RVR, CHA2DS2-Vasc score 5 >>anticoag stopped due to thrombocytopenia. Coumadin resumed 2/18. Hgb 12.1.  Plts 127 rising. INR 1.1 - Book, video 2/18 but too confused for education.  Goal of Therapy:  INR 2-3 Monitor platelets by anticoagulation protocol: Yes   Plan:  - Coumadin 5mg  po x 1 tonight. Daily INR - f/u to teach patient when encephalopathy improved  **Ceftaz 1 g IV q24h (stop date 2/23, ID note says 3/23 but 2 wks duration, 1st neg bcx 2/10- Lucianne [place] meant 2/23)**  Tracia Lacomb S. Alford Highland, PharmD, BCPS Clinical Staff Pharmacist Eilene Ghazi Stillinger 07/18/2018,10:22 AM

## 2018-07-18 NOTE — Progress Notes (Addendum)
EP asked to comment regarding NSVT.  Patient is discussed with Dr. Lovena Le Patient with ICD in place, no programming changes or medication adjustments recommended for NSVT.  Tommye Standard, PA-C  Mikle Bosworth.D.

## 2018-07-18 NOTE — Progress Notes (Signed)
  Speech Language Pathology Treatment: Dysphagia  Patient Details Name: Keith Young MRN: [id number] DOB: 06-Mar-1951 Today's Date: 07/18/2018 Time: 1050-1100 SLP Time Calculation (min) (ACUTE ONLY): 10 min  Assessment / Plan / Recommendation Clinical Impression  Pt continues with persisting dysphagia, adequate toleration of current diet of dysphagia 2/honey-thick liquids, but trials of thinner liquids continue to elicit s/s of aspiration with significant coughing. Chin tuck did not alleviate clinical signs of aspiration. Pt requires verbal cues to monitor bolus size and rate.  He has poor awareness of difficulty and decreased recall.  Continue current diet.  SLP will follow.   HPI HPI: Keith Young is a 68 y.o. male with PMH significant of HIV, type 2 diabetes, COPD, CVA (40981, HTN, Bells Palsy, systolic CHF, CKD, and history of AICD placement followed by cardiology. Pt was [facility] to [facility] ED [facility]07/18 and after presenting with AMS at SNF residence, vomitted, and became increasingly unresponsive. Per PA note, pt was found to be encephalopathic secondary to sepsis and renal failure. Pt seen by ST in Jan 2018; BSE recc D2/thin. CXR showed cardiomegaly and small left pleural effusion. Head CT revealed new age indeterminate RIGHT basal ganglia small infarcts, old RIGHT temporal occipital/PCA territory infarct, old basal ganglia, thalami, pontine and cerebellar small infarcts.  No new imaging since MBSS      SLP Plan  Continue with current plan of care       Recommendations  Diet recommendations: Dysphagia 2 (fine chop);Honey-thick liquid Liquids provided via: Cup Medication Administration: Whole meds with puree Supervision: Full supervision/cueing for compensatory strategies Compensations: Slow rate;Small sips/bites;Minimize environmental distractions;Clear throat intermittently Postural Changes and/or Swallow Maneuvers: Seated upright 90 degrees                Oral Care  Recommendations: Oral care BID Follow up Recommendations: Skilled Nursing facility SLP Visit Diagnosis: Dysphagia, oropharyngeal phase (R13.12) Plan: Continue with current plan of care       GO              Chole Driver L. Tivis Ringer, [place] CCC/SLP Acute Rehabilitation Services Office number [phone] Pager [phone]   Keith Young 07/18/2018, 12:03 PM

## 2018-07-18 NOTE — Progress Notes (Signed)
Upper extremity vein mapping has been completed.   Preliminary results in CV Proc.   Keith Young 07/18/2018 2:15 PM

## 2018-07-18 NOTE — Consult Note (Addendum)
[facility] NOTE   MRN : [id number]  Reason for Consult: ESRD on HD via temp cath placed 07/09/2018 by IR Referring Physician: DR. Joelyn Oms  History of Present Illness: 68 y/o male with ESRD in need of TDC placement and permanent access.  He is right handed, but has left AICD.  He has a temp Catheter on the right.  He was initially started on broad-spectrum antibiotics.  Because of acute kidney injury and altered mental status, he was also started on dialysis.  He was found to have E. coli bacteremia.  He had LP done on 07/10/2018.    ID recommends IV ceftazidime: Stop date is August 20, 2018.  New onset paroxysmal A. fib with RVR with intermittent episodes of NSVT -Currently rate controlled, now on Coumadin INR 1.12.   Past medical history includes:  CHF ejection fraction 30% as well as chronic kidney disease followed by nephrology also a history of AICD placement followed by cardiology.  He also has a history of HIV followed by infectious disease-as well as type 2 diabetes, COPD, hypertension, and a history of CVA.     Current Facility-Administered Medications  Medication Dose Route Frequency Provider Last Rate Last Dose  . 0.9 %  sodium chloride infusion   Intravenous PRN Aline August, MD   Stopped at 07/15/18 0106  . acetaminophen (TYLENOL) tablet 650 mg  650 mg Oral Q6H PRN Etta Quill, DO   650 mg at 07/10/18 2110   Or  . acetaminophen (TYLENOL) suppository 650 mg  650 mg Rectal Q6H PRN Etta Quill, DO      . albuterol (PROVENTIL) (2.5 MG/3ML) 0.083% nebulizer solution 2.5 mg  2.5 mg Nebulization Q4H PRN Gardiner Barefoot, NP   2.5 mg at 07/12/18 0445  . bictegravir-emtricitabine-tenofovir AF (BIKTARVY) 50-200-25 MG per tablet 1 tablet  1 tablet Oral Daily Tommy Medal, Lavell Islam, MD   1 tablet at 07/18/18 1031  . cefTAZidime (FORTAZ) 1 g in sodium chloride 0.9 % 100 mL IVPB  1 g Intravenous Q24H Tommy Medal, Lavell Islam, MD 200 mL/hr at  07/17/18 2209 1 g at 07/17/18 2209  . Chlorhexidine Gluconate Cloth 2 % PADS 6 each  6 each Topical Q0600 Finnigan, Izora Gala A, DO   6 each at 07/18/18 5366  . insulin aspart (novoLOG) injection 0-9 Units  0-9 Units Subcutaneous Q4H Jennette Kettle M, DO   1 Units at 07/18/18 1246  . MEDLINE mouth rinse  15 mL Mouth Rinse BID Jonetta Osgood, MD   15 mL at 07/18/18 1246  . metoprolol succinate (TOPROL-XL) 24 hr tablet 25 mg  25 mg Oral Daily Starla Link, Kshitiz, MD   25 mg at 07/18/18 1030  . metoprolol tartrate (LOPRESSOR) injection 5 mg  5 mg Intravenous Q6H PRN Georgette Shell, MD      . mometasone-formoterol [facility]) 100-5 MCG/ACT inhaler 2 puff  2 puff Inhalation BID Aline August, MD   2 puff at 07/18/18 0742  . ondansetron (ZOFRAN) tablet 4 mg  4 mg Oral Q6H PRN Etta Quill, DO       Or  . ondansetron [facility]) injection 4 mg  4 mg Intravenous Q6H PRN Etta Quill, DO   4 mg at 07/07/18 4403  . predniSONE (DELTASONE) tablet 40 mg  40 mg Oral Q breakfast Starla Link, Kshitiz, MD   40 mg at 07/18/18 1030  . [place]   Oral PRN Aline August, MD      .  sodium chloride flush (NS) 0.9 % injection 10-40 mL  10-40 mL Intracatheter PRN Aline August, MD   10 mL at 07/17/18 2119  . warfarin (COUMADIN) tablet 5 mg  5 mg Oral ONCE-1800 Robertson, Crystal S, RPH      . warfarin (COUMADIN) video   Does not apply Once Dezmon, [place], RPH      . Warfarin - Pharmacist Dosing Inpatient   Does not apply q1800 [place], [place], [facility]        Pt meds include: Statin :No Betablocker: Yes ASA: No Other anticoagulants/antiplatelets: Coumadin  Past Medical History:  Diagnosis Date  . AICD (automatic cardioverter/defibrillator) present   . Alcoholism in remission ([place])   . Anemia   . Aortic insufficiency   . Arthritis   . At moderate risk for fall   . Bell's palsy   . Chronic systolic heart failure (HCC)    NYHA Class III  . CKD (chronic kidney disease), stage IV ([place])    . COPD (chronic obstructive pulmonary disease) ([place])   . Essential hypertension, benign   . HIV disease ([place]) 09/06/2016  . Hyperlipidemia   . NICM (nonischemic cardiomyopathy) ([place])   . Noncompliance with medication regimen   . NSVT (nonsustained ventricular tachycardia) ([place])   . Numbness of right jaw    Had a stoke in 01/2013. Numbness is occasional, especially when trying to chew.  . OSA (obstructive sleep apnea)   . Productive cough 08/2013   With brown sputum   . S/P aortic valve replacement with bioprosthetic valve   . Stroke ([place]) 01/2013   weakness of right side from CVA  . Type 2 diabetes mellitus ([place])   . Uses hearing aid 2014   recently received new hearing aids    Past Surgical History:  Procedure Laterality Date  . BIOPSY N/A 04/10/2014   Procedure: GASTRIC BIOPSY;  Surgeon: Daneil Dolin, MD;  Location: [facility] ORS;  Service: Endoscopy;  Laterality: N/A;  . BIOPSY  10/12/2015   Procedure: BIOPSY;  Surgeon: Daneil Dolin, MD;  Location: [facility] ENDO SUITE;  Service: Endoscopy;;  stomach bx's  . CARDIAC DEFIBRILLATOR PLACEMENT  11/12/12   [employer] Inogen MINI ICD implanted in [place] at [facility] per Dr Nelly Laurence note  . COLONOSCOPY WITH PROPOFOL N/A 04/10/2014   RMR: Colonic Diverticulosis  . ESOPHAGOGASTRODUODENOSCOPY (EGD) WITH PROPOFOL N/A 04/10/2014   RMR: Mild erosive reflux esophagitis. Multiple antral polyps likely hyperplastic status post removal by hot snare cautery technique. Diffusely abnormal stomach status post gastric biopsy I suspect some of patients anemaia may be due to intermittent oozing from the stomach. It would be difficult and a risky proposition to attempt complete removal of all of his gastric polyps.  . ESOPHAGOGASTRODUODENOSCOPY (EGD) WITH PROPOFOL N/A 10/12/2015   Procedure: ESOPHAGOGASTRODUODENOSCOPY (EGD) WITH PROPOFOL;  Surgeon: Daneil Dolin, MD;  Location: [facility] ENDO SUITE;  Service:  Endoscopy;  Laterality: N/A;  2952 - moved to 9:15  . IR FLUORO GUIDE CV LINE RIGHT  07/09/2018  . IR US GUIDE VASC ACCESS RIGHT  07/09/2018  . POLYPECTOMY N/A 04/10/2014   Procedure: GASTRIC POLYPECTOMY;  Surgeon: Daneil Dolin, MD;  Location: [facility] ORS;  Service: Endoscopy;  Laterality: N/A;  . RIGHT HEART CATHETERIZATION N/A 02/06/2014   Procedure: RIGHT HEART CATH;  Surgeon: Larey Dresser, MD;  Location: [facility] CATH LAB;  Service: Cardiovascular;  Laterality: N/A;    Social History Social History  Tobacco Use  . Smoking status: Former Smoker    Types: Cigarettes    Last attempt to quit: 11/05/1993    Years since quitting: 24.7  . Smokeless tobacco: Never Used  Substance Use Topics  . Alcohol use: Not Currently    Comment: former heavy ETOH, none recently  . Drug use: No    Comment: prior history of cocaine use, smoking     Family History Family History  Problem Relation Age of Onset  . Kidney disease Mother   . Kidney disease Sister   . Colon cancer Neg Hx     No Known Allergies   REVIEW OF SYSTEMS  General: [ ]  Weight loss, [ ]  Fever, [ ]  chills Neurologic: [ ]  Dizziness, [ ]  Blackouts, [ ]  Seizure [ ]  Stroke, [ ]  "Mini stroke", [ ]  Slurred speech, [ ]  Temporary blindness; [ ]  weakness in arms or legs, [ ]  Hoarseness [ ]  Dysphagia Cardiac: [ ]  Chest pain/pressure, [ ]  Shortness of breath at rest [x ] Shortness of breath with exertion, [ ]  Atrial fibrillation or irregular heartbeat  Vascular: [ ]  Pain in legs with walking, [ ]  Pain in legs at rest, [ ]  Pain in legs at night,  [ ]  Non-healing ulcer, [ ]  Blood clot in vein/DVT,   Pulmonary: [ ]  Home oxygen, [ ]  Productive cough, [ ]  Coughing up blood, [ ]  Asthma,  [ ]  Wheezing [x ] COPD Musculoskeletal:  [ ]  Arthritis, [ ]  Low back pain, [ ]  Joint pain Hematologic: [ ]  Easy Bruising, [x ] Anemia; [ ]  Hepatitis Gastrointestinal: [ ]  Blood in stool, [ ]  Gastroesophageal Reflux/heartburn, Urinary: [ ]  chronic Kidney disease,  [x ] on HD - [ ]  MWF or [ ]  TTHS, [ ]  Burning with urination, [ ]  Difficulty urinating Skin: [ ]  Rashes, [ ]  Wounds Psychological: [ ]  Anxiety, [ ]  Depression  Physical Examination Vitals:   07/17/18 1951 07/17/18 2037 07/18/18 0444 07/18/18 0743  BP:  (!) 145/86 135/77   Pulse: 91 80 75   Resp: 18 18    Temp:  98.5 F (36.9 C) 98.4 F (36.9 C)   TempSrc:  Oral Oral   SpO2: 97% 93% 100% 94%  Weight:      Height:       Body mass index is 28.7 kg/m.  General:  WDWN in NAD HENT: WNL Eyes: Pupils equal Pulmonary: normal non-labored breathing , without Rales,+  rhonchi,  wheezing Cardiac: RRR, without  Murmurs, rubs or gallops; Abdomen: soft, NT, no masses Skin: no rashes, ulcers noted;  no Gangrene , no cellulitis; no open wounds;   Vascular Exam/Pulses:Palpable B radial pulses, temp right catheter   Musculoskeletal: no muscle wasting or atrophy; no edema  Neurologic: A&O X 3; Appropriate Affect ;  SENSATION: normal; MOTOR FUNCTION: 5/5 Symmetric Speech is fluent/normal   Significant Diagnostic Studies: CBC Lab Results  Component Value Date   WBC 6.9 07/18/2018   HGB 12.2 (L) 07/18/2018   HCT 38.7 (L) 07/18/2018   MCV 89.6 07/18/2018   PLT 127 (L) 07/18/2018    BMET    Component Value Date/Time   NA 140 07/18/2018 0341   K 3.7 07/18/2018 0341   CL 105 07/18/2018 0341   CO2 25 07/18/2018 0341   GLUCOSE 93 07/18/2018 0341   BUN 48 (H) 07/18/2018 0341   CREATININE 3.74 (H) 07/18/2018 0341   CREATININE 2.33 (H) 07/09/2014 1145   CALCIUM 7.0 (L) 07/18/2018 0341   CALCIUM  10.3 (H) 02/14/2017 0800   GFRNONAA 16 (L) 07/18/2018 0341   GFRAA 18 (L) 07/18/2018 0341   Estimated Creatinine Clearance: 17.5 mL/min (A) (by C-G formula based on SCr of 3.74 mg/dL (H)).  COAG Lab Results  Component Value Date   INR 1.12 07/18/2018   INR 1.23 12/09/2016   INR 1.09 02/06/2014     Non-Invasive Vascular Imaging:  Pending vein mapping  ASSESSMENT/PLAN:  Right hand  dominant male currently on HD via right temp catheter.  Recent new A fib started on Coumadin INR 1.12.  Will hold.  Pending vein mapping will plan exchange of temp cath with [facility] and right UE AV fistula verse graft Friday by Dr. Donnetta Hutching.  Roxy Horseman 07/18/2018 1:35 PM  I have independently interviewed and examined patient and agree with PA assessment and plan above.  Patient has temporary catheter at this time.  We will plan for left arm AV fistula versus graft and conversion to tunneled catheter on Friday.  N.p.o. past midnight tomorrow.  Tremain Rucinski C. Donzetta Matters, MD [facility] [place] Office: [phone] Pager: [phone]

## 2018-07-18 NOTE — Progress Notes (Addendum)
Inpatient Diabetes Program Recommendations  AACE/ADA: New Consensus Statement on Inpatient Glycemic Control (2015)  Target Ranges:  Prepandial:   less than 140 mg/dL      Peak postprandial:   less than 180 mg/dL (1-2 hours)      Critically ill patients:  140 - 180 mg/dL   Lab Results  Component Value Date   GLUCAP 137 (H) 07/18/2018   HGBA1C 6.5 (H) 05/22/2018     Results for Keith Young, Keith Young (MRN [id number]) as of 07/18/2018 16:08  Ref. Range 07/18/2018 00:15 07/18/2018 04:38 07/18/2018 05:19 07/18/2018 08:17 07/18/2018 12:01  Glucose-Capillary Latest Ref Range: 70 - 99 mg/dL 273 (H)  Novolog 5 units  58 (L) 97 78 137 (H)    Review of Glycemic Control  Diabetes history: DM 2  Outpatient Diabetes medications: Januvia 50 mg daily   Current orders for Inpatient glycemic control: Novolog sensitive (0-9 units) Q4 hours   Prednisone 40 mg daily     Note patient dropped from 273 mg/dl to 58 mg/dl after 5 units of Novolog during the night. Recommend decreasing CBG/coverage to ac/hs. If prednisone continues, may consider adding Novolog meal coverage tid - will follow trends.     MD please consider following in-patient insulin adjustments:  Decrease Novolog sensitive (0-9 units) to ac & (0-5 units) hs.    Thanks.  -- Will follow during hospitalization.--  [name] RN, MSN Diabetes Coordinator Inpatient Glycemic Control Team Team Pager: [phone] (8am-5pm)

## 2018-07-18 NOTE — Progress Notes (Signed)
PROGRESS NOTE        PATIENT DETAILS Name: Keith Young Age: 68 y.o. Sex: male Date of Birth: 05/02/1951 Admit Date: 07/06/2018 Admitting Physician Etta Quill, DO DSK:AJGOT, Rene Kocher, MD  Brief Narrative: Patient is a 68 y.o. male with history of chronic systolic heart failure with AICD in place, bioprosthetic AVR, stage IV CKD, DM-2, COPD, HIV on antiretrovirals-presented with sepsis secondary to E. coli bacteremia and acute kidney injury.  See below for further details  Subjective: Feels very weak and debilitated-but denies any chest pain or shortness of breath.  Assessment/Plan: Sepsis secondary to E. coli bacteremia: Sepsis pathophysiology has resolved-evaluated by infectious disease, recommendations are for 2-week duration of ceftazidime-end date of February 23.  CT abdomen negative for any foci for bacteremia.  Acute metabolic encephalopathy: Multifactorial-secondary to sepsis and also from uremia due to worsening renal function.  Has improved-able to answer most of my questions appropriately but still somewhat slow to respond.  Underwent LP on 2/11-CSF not consistent with meningitis.  Acute kidney injury on stage IV CKD: AKI likely ATN in the setting of sepsis.  Discussed with nephrology-plans are to HD as AKI treatment.  Vascular surgery consulted for left arm fistula, and tunneled dialysis catheter on Friday.  HIV: Continue antiretrovirals.  PAF: Rate controlled-continue metoprolol.  He previously was on IV heparin but discontinued due to worsening thrombocytopenia-he was restarted on Coumadin but he now requires tunneled HD catheter and AV fistula placement-scheduled for 2/21-hold Coumadin until all procedures are complete.  Thrombocytopenia: Probably secondary to sepsis-improving  Chronic systolic heart failure (EF 20-25% by TTE on 07/12/2018): Volume status relatively stable will need to be managed with HD going forward.  Bioprosthetic AVR:  TTE on 2/13 without any obvious vegetation-repeat blood cultures on 2/10.  DM-2 with hypoglycemia: Change SSI sensitive scale to [facility] at bedtime.  Appears to have brittle diabetes with both hypoglycemic and hyperglycemic episodes-we will need to allow some permissive hyperglycemia given multiple medical comorbidities.  Most recent A1c in December 2019 6.5  Hypertension: Controlled-continue metoprolol.  Dysphagia: Speech therapy following-on dysphagia 2 diet.  Has prior history of CVA-along with profound deconditioning-may be contributing to oropharyngeal dysphagia.  Deconditioning/debility: Secondary to acute illness-PT evaluation completed-SNF on discharge  DVT Prophylaxis: SCD's  Code Status: Full code   Family Communication: None at bedside  Disposition Plan: Remain inpatient  Antimicrobial agents: Anti-infectives (From admission, onward)   Start     Dose/Rate Route Frequency Ordered Stop   07/12/18 2200  cefTAZidime (FORTAZ) 1 g in sodium chloride 0.9 % 100 mL IVPB     1 g 200 mL/hr over 30 Minutes Intravenous Every 24 hours 07/12/18 1449 07/22/18 2359   07/10/18 1430  vancomycin (VANCOCIN) IVPB 1000 mg/200 mL premix  Status:  Discontinued     1,000 mg 200 mL/hr over 60 Minutes Intravenous Every 48 hours 07/08/18 1421 07/09/18 0959   07/10/18 1000  cefTRIAXone (ROCEPHIN) 2 g in sodium chloride 0.9 % 100 mL IVPB  Status:  Discontinued     2 g 200 mL/hr over 30 Minutes Intravenous Every 24 hours 07/10/18 0942 07/12/18 1433   07/10/18 0847  cefTRIAXone (ROCEPHIN) 2 g in sodium chloride 0.9 % 100 mL IVPB  Status:  Discontinued     2 g 200 mL/hr over 30 Minutes Intravenous Every 24 hours 07/09/18 1004 07/10/18 0933  07/09/18 1400  acyclovir (ZOVIRAX) 745 mg in dextrose 5 % 150 mL IVPB  Status:  Discontinued     10 mg/kg  74.5 kg 164.9 mL/hr over 60 Minutes Intravenous Every 24 hours 07/08/18 1421 07/09/18 0959   07/09/18 1300  bictegravir-emtricitabine-tenofovir AF (BIKTARVY)  50-200-25 MG per tablet 1 tablet     1 tablet Oral Daily 07/09/18 1012     07/08/18 2300  cefTRIAXone (ROCEPHIN) 2 g in sodium chloride 0.9 % 100 mL IVPB  Status:  Discontinued     2 g 200 mL/hr over 30 Minutes Intravenous Every 12 hours 07/08/18 1341 07/09/18 1004   07/08/18 1800  vancomycin (VANCOCIN) IVPB 1000 mg/200 mL premix  Status:  Discontinued     1,000 mg 200 mL/hr over 60 Minutes Intravenous Every 48 hours 07/06/18 2019 07/07/18 1137   07/08/18 1430  ampicillin (OMNIPEN) 2 g in sodium chloride 0.9 % 100 mL IVPB  Status:  Discontinued     2 g 300 mL/hr over 20 Minutes Intravenous Every 12 hours 07/08/18 1343 07/09/18 0959   07/08/18 1430  vancomycin (VANCOCIN) 1,500 mg in sodium chloride 0.9 % 500 mL IVPB     1,500 mg 250 mL/hr over 120 Minutes Intravenous  Once 07/08/18 1421 07/08/18 1723   07/08/18 1400  acyclovir (ZOVIRAX) 745 mg in dextrose 5 % 150 mL IVPB     10 mg/kg  74.5 kg 164.9 mL/hr over 60 Minutes Intravenous  Once 07/08/18 1345 07/09/18 1315   07/07/18 1800  ceFEPIme (MAXIPIME) 1 g in sodium chloride 0.9 % 100 mL IVPB  Status:  Discontinued     1 g 200 mL/hr over 30 Minutes Intravenous Every 24 hours 07/06/18 2019 07/07/18 1137   07/07/18 1145  cefTRIAXone (ROCEPHIN) 2 g in sodium chloride 0.9 % 100 mL IVPB  Status:  Discontinued     2 g 200 mL/hr over 30 Minutes Intravenous Every 24 hours 07/07/18 1137 07/08/18 1341   07/07/18 1000  bictegravir-emtricitabine-tenofovir AF (BIKTARVY) 50-200-25 MG per tablet 1 tablet  Status:  Discontinued     1 tablet Oral Daily 07/07/18 0058 07/07/18 1127   07/06/18 1900  ceFEPIme (MAXIPIME) 2 g in sodium chloride 0.9 % 100 mL IVPB     2 g 200 mL/hr over 30 Minutes Intravenous  Once 07/06/18 1859 07/06/18 2001   07/06/18 1900  vancomycin (VANCOCIN) IVPB 1000 mg/200 mL premix     1,000 mg 200 mL/hr over 60 Minutes Intravenous  Once 07/06/18 1859 07/06/18 2035      Procedures: 2/11>>LP 2/10>> IR guided HD catheter  placement  CONSULTS:  cardiology, nephrology, neurology and vascular surgery  ID  Time spent: 25-minutes-Greater than 50% of this time was spent in counseling, explanation of diagnosis, planning of further management, and coordination of care.  MEDICATIONS: Scheduled Meds: . bictegravir-emtricitabine-tenofovir AF  1 tablet Oral Daily  . Chlorhexidine Gluconate Cloth  6 each Topical Q0600  . insulin aspart  0-9 Units Subcutaneous Q4H  . mouth rinse  15 mL Mouth Rinse BID  . metoprolol succinate  25 mg Oral Daily  . mometasone-formoterol  2 puff Inhalation BID  . predniSONE  40 mg Oral Q breakfast  . warfarin  5 mg Oral ONCE-1800  . warfarin   Does not apply Once  . Warfarin - Pharmacist Dosing Inpatient   Does not apply q1800   Continuous Infusions: . sodium chloride Stopped (07/15/18 0106)  . cefTAZidime (FORTAZ)  IV 1 g (07/17/18 2209)   PRN Meds:.sodium chloride,  acetaminophen **OR** acetaminophen, albuterol, metoprolol tartrate, ondansetron **OR** ondansetron (ZOFRAN) IV, RESOURCE THICKENUP CLEAR, sodium chloride flush   PHYSICAL EXAM: Vital signs: Vitals:   07/17/18 2037 07/18/18 0444 07/18/18 0743 07/18/18 1542  BP: (!) 145/86 135/77  138/86  Pulse: 80 75  79  Resp: 18   16  Temp: 98.5 F (36.9 C) 98.4 F (36.9 C)  98.1 F (36.7 C)  TempSrc: Oral Oral  Oral  SpO2: 93% 100% 94% 99%  Weight:      Height:       Filed Weights   07/14/18 0528 07/15/18 0656 07/17/18 1231  Weight: 72.9 kg 73.6 kg 73.5 kg   Body mass index is 28.7 kg/m.   General appearance :Awake, alert, not in any distress.  Chronically sick appearing HEENT: Atraumatic and Normocephalic Neck: supple, no JVD. No cervical lymphadenopathy. No thyromegaly Resp:Good air entry bilaterally, no added sounds  CVS: S1 S2 regular GI: Bowel sounds present, Non tender and not distended with no gaurding, rigidity or rebound.No organomegaly Extremities: B/L Lower Ext shows no edema, both legs are warm to  touch Neurology: Has generalized weakness but moving all 4 extremities Musculoskeletal:No digital cyanosis Skin:No Rash, warm and dry Wounds:N/A  I have personally reviewed following labs and imaging studies  LABORATORY DATA: CBC: Recent Labs  Lab 07/14/18 0531 07/15/18 0530 07/16/18 0430 07/17/18 0416 07/18/18 0341  WBC 4.3 4.3 2.8* 3.1* 6.9  NEUTROABS 3.2 3.0 2.0 2.1 5.7  HGB 12.7* 12.4* 11.3* 11.8* 12.2*  HCT 41.3 38.4* 35.4* 37.3* 38.7*  MCV 90.2 90.4 90.1 89.9 89.6  PLT 68* 82* 89* 103* 127*    Basic Metabolic Panel: Recent Labs  Lab 07/13/18 0337 07/14/18 0531 07/15/18 0530 07/16/18 0430 07/17/18 0416 07/18/18 0341  NA 143 138 138 139 139 140  K 4.7 4.4 3.9 3.7 3.5 3.7  CL 109 104 104 104 103 105  CO2 24 23 27 22 23 25   GLUCOSE 161* 157* 115* 135* 114* 93  BUN 72* 53* 77* 87* 95* 48*  CREATININE 5.74* 4.43* 5.55* 5.94* 6.00* 3.74*  CALCIUM 7.4* 7.1* 6.7* 6.4* 6.4* 7.0*  MG 2.1 2.1  --  2.2 2.1 1.9    GFR: Estimated Creatinine Clearance: 17.5 mL/min (A) (by C-G formula based on SCr of 3.74 mg/dL (H)).  Liver Function Tests: Recent Labs  Lab 07/16/18 0430  AST 31  ALT 36  ALKPHOS 62  BILITOT 0.5  PROT 5.5*  ALBUMIN 2.0*   No results for input(s): LIPASE, AMYLASE in the last 168 hours. No results for input(s): AMMONIA in the last 168 hours.  Coagulation Profile: Recent Labs  Lab 07/18/18 0341  INR 1.12    Cardiac Enzymes: No results for input(s): CKTOTAL, CKMB, CKMBINDEX, TROPONINI in the last 168 hours.  BNP (last 3 results) No results for input(s): PROBNP in the last 8760 hours.  HbA1C: No results for input(s): HGBA1C in the last 72 hours.  CBG: Recent Labs  Lab 07/18/18 0438 07/18/18 0519 07/18/18 0817 07/18/18 1201 07/18/18 1647  GLUCAP 58* 97 78 137* 256*    Lipid Profile: No results for input(s): CHOL, HDL, LDLCALC, TRIG, CHOLHDL, LDLDIRECT in the last 72 hours.  Thyroid Function Tests: No results for input(s): TSH,  T4TOTAL, FREET4, T3FREE, THYROIDAB in the last 72 hours.  Anemia Panel: No results for input(s): VITAMINB12, FOLATE, FERRITIN, TIBC, IRON, RETICCTPCT in the last 72 hours.  Urine analysis:    Component Value Date/Time   COLORURINE AMBER (A) 07/08/2018 0839   APPEARANCEUR  CLOUDY (A) 07/08/2018 0839   LABSPEC 1.016 07/08/2018 0839   PHURINE 5.0 07/08/2018 0839   GLUCOSEU NEGATIVE 07/08/2018 0839   HGBUR SMALL (A) 07/08/2018 0839   BILIRUBINUR NEGATIVE 07/08/2018 0839   KETONESUR 5 (A) 07/08/2018 0839   PROTEINUR 100 (A) 07/08/2018 0839   UROBILINOGEN 0.2 10/23/2013 1545   NITRITE NEGATIVE 07/08/2018 0839   LEUKOCYTESUR LARGE (A) 07/08/2018 0839    Sepsis Labs: Lactic Acid, Venous    Component Value Date/Time   LATICACIDVEN 1.5 07/08/2018 1952    MICROBIOLOGY: Recent Results (from the past 240 hour(s))  Culture, blood (Routine X 2) w Reflex to ID Panel     Status: None   Collection Time: 07/09/18 10:18 AM  Result Value Ref Range Status   Specimen Description BLOOD RIGHT HAND  Final   Special Requests   Final    BOTTLES DRAWN AEROBIC AND ANAEROBIC Blood Culture adequate volume   Culture   Final    NO GROWTH 5 DAYS Performed at [facility], [address], [place], [place] [address]    Report Status 07/14/2018 FINAL  Final  Culture, blood (Routine X 2) w Reflex to ID Panel     Status: None   Collection Time: 07/09/18 10:25 AM  Result Value Ref Range Status   Specimen Description BLOOD LEFT HAND  Final   Special Requests   Final    BOTTLES DRAWN AEROBIC AND ANAEROBIC Blood Culture adequate volume   Culture   Final    NO GROWTH 5 DAYS Performed at [facility], [place] [address], [place], [place] [address]    Report Status 07/14/2018 FINAL  Final  C difficile quick scan w PCR reflex     Status: Abnormal   Collection Time: 07/09/18 10:38 AM  Result Value Ref Range Status   C Diff antigen POSITIVE (A) NEGATIVE Final   C Diff toxin NEGATIVE NEGATIVE Final   C  Diff interpretation Results are indeterminate. See PCR results.  Final    Comment: Performed at [facility], [place] [address], [place], [place] [address]  C. Diff by PCR, Reflexed     Status: Abnormal   Collection Time: 07/09/18 10:38 AM  Result Value Ref Range Status   Toxigenic C. Difficile by PCR POSITIVE (A) NEGATIVE Final    Comment: Positive for toxigenic C. difficile with little to no toxin production. Only treat if clinical presentation suggests symptomatic illness. Performed at [facility], [place] [address], [place], [place] [address]   CSF culture     Status: None   Collection Time: 07/10/18  2:44 PM  Result Value Ref Range Status   Specimen Description CSF  Final   Special Requests NONE  Final   Gram Stain   Final    WBC PRESENT,BOTH PMN AND MONONUCLEAR NO ORGANISMS SEEN CYTOSPIN SMEAR    Culture   Final    NO GROWTH 3 DAYS Performed at [facility], [place] [address] [place], [place] [address]    Report Status 07/13/2018 FINAL  Final  Fungus Culture With Stain     Status: None (Preliminary result)   Collection Time: 07/10/18  2:44 PM  Result Value Ref Range Status   Fungus Stain Final report  Final    Comment: (NOTE) Performed At: [facility] [place], [place] [id number] Rush Farmer MD YB:[id number]    Fungus (Mycology) Culture PENDING  Incomplete   Fungal Source CSF  Final  Fungus Culture  Result     Status: None   Collection Time: 07/10/18  2:44 PM  Result Value Ref Range Status   Result 1 Comment  Final    Comment: (NOTE) KOH/Calcofluor preparation:  no fungus observed. Performed At: [facility] [place], [place] [id number] Rush Farmer MD ZM:[id number]     RADIOLOGY STUDIES/RESULTS: Ct Abdomen Pelvis Wo Contrast  Result Date: 07/12/2018 CLINICAL DATA:  Bacteremia EXAM: CT ABDOMEN AND PELVIS WITHOUT CONTRAST TECHNIQUE: Multidetector CT imaging of the abdomen and pelvis was performed  following the standard protocol without IV contrast. COMPARISON:  None. FINDINGS: Lower chest: Lung bases demonstrate mild atelectatic changes without sizable effusion. Coronary calcifications are seen. Defibrillator is again noted. Hepatobiliary: No focal liver abnormality is seen. No gallstones, gallbladder wall thickening, or biliary dilatation. Pancreas: Unremarkable. No pancreatic ductal dilatation or surrounding inflammatory changes. Spleen: Normal in size without focal abnormality. Adrenals/Urinary Tract: Adrenal glands are within normal limits. Kidneys are well visualized bilaterally. Right renal cyst is seen. A smaller left renal cyst is noted as well no renal calculi or obstructive changes are seen. The ureters are within normal limits bilaterally. The bladder is partially distended. Stomach/Bowel: Diverticular changes noted within the colon without evidence of diverticulitis. The appendix is within normal limits. No small bowel inflammatory changes are seen. Stomach is decompressed. Vascular/Lymphatic: Aortic atherosclerosis. No enlarged abdominal or pelvic lymph nodes. Reproductive: Prostate is unremarkable. Other: No abdominal wall hernia or abnormality. No abdominopelvic ascites. Musculoskeletal: Degenerative changes of the lumbar spine are seen. IMPRESSION: Diverticulosis without diverticulitis. Bilateral renal cysts. Mild bibasilar atelectasis. Electronically Signed   By: Inez Catalina M.D.   On: 07/12/2018 19:09   Dg Chest 1 View  Result Date: 07/07/2018 CLINICAL DATA:  68 year old male with history of tachycardia. EXAM: CHEST  1 VIEW COMPARISON:  Chest x-ray 07/06/2018. FINDINGS: Status post median sternotomy. External defibrillator pad projecting over the lower left hemithorax. Left-sided pacemaker/AICD with lead tip projecting over the expected location of the right ventricular apex. Lung volumes are slightly low. No acute consolidative airspace disease. Small left pleural effusion. No right  pleural effusion. There is cephalization of the pulmonary vasculature and slight indistinctness of the interstitial markings suggestive of mild pulmonary edema. Mild cardiomegaly. The patient is rotated to the left on today's exam, resulting in distortion of the mediastinal contours and reduced diagnostic sensitivity and specificity for mediastinal pathology. Aortic atherosclerosis. IMPRESSION: 1. Mild interstitial pulmonary edema and small left pleural effusion in the setting of mild cardiomegaly; imaging findings concerning for congestive heart failure. 2. Aortic atherosclerosis. 3. Postoperative changes and support apparatus, as above. Electronically Signed   By: Vinnie Langton M.D.   On: 07/07/2018 11:54   Ct Head Wo Contrast  Result Date: 07/11/2018 CLINICAL DATA:  Initial evaluation for acute altered mental status, asymmetric left-sided weakness with left facial droop. EXAM: CT HEAD WITHOUT CONTRAST TECHNIQUE: Contiguous axial images were obtained from the base of the skull through the vertex without intravenous contrast. COMPARISON:  Prior CT from 07/06/2018. FINDINGS: Brain: Age advanced cerebral atrophy with chronic microvascular ischemic disease, stable. Remote right MCA and/or MCA/PCA watershed territory infarct involving the right temporal occipital region noted, stable. Multiple chronic lacunar infarcts seen involving the bilateral basal ganglia, thalami, pons, and cerebellum. Specifically, previously noted right basal ganglia lacunar infarcts are stable from previous exam, likely chronic. No acute intracranial hemorrhage. No new or evolving acute large vessel territory infarct. No mass lesion, midline shift or mass effect. No hydrocephalus. No extra-axial fluid collection.  Vascular: No hyperdense vessel. Calcified atherosclerosis at the skull base. Skull: Scalp soft tissues and calvarium within normal limits. Sinuses/Orbits: Globes and orbital soft tissues within normal limits. Scattered mucosal  thickening with superimposed air-fluid levels noted within the paranasal sinuses, worsened from previous. Mastoid air cells are clear. Other: None. IMPRESSION: 1. Stable head CT, with no new acute intracranial abnormality. 2. In band cerebral atrophy with chronic small vessel ischemic disease with multiple chronic ischemic infarcts as above. 3. Acute pan sinusitis, worsened from previous. Electronically Signed   By: Jeannine Boga M.D.   On: 07/11/2018 17:20   Ct Head Wo Contrast  Result Date: 07/06/2018 CLINICAL DATA:  Altered mental status. History of HIV, hypertension, hypercholesterolemia and diabetes. EXAM: CT HEAD WITHOUT CONTRAST TECHNIQUE: Contiguous axial images were obtained from the base of the skull through the vertex without intravenous contrast. COMPARISON:  CT HEAD July 21, 2016 FINDINGS: BRAIN: No intraparenchymal hemorrhage, mass effect, midline shift or acute large vascular territory infarcts. Old small RIGHT cerebellar and pontine infarcts. RIGHT temporal occipital encephalomalacia. New subcentimeter RIGHT internal capsule versus basal ganglia hypodensities. Old RIGHT basal ganglia infarct with mild ex vacuo dilatation RIGHT lateral ventricle. Old small bilateral thalami and LEFT basal ganglia infarcts. Confluent supratentorial white matter hypodensities. Moderate parenchymal brain volume loss. No hydrocephalus. VASCULAR: Moderate calcific atherosclerosis of the carotid siphons. SKULL: No skull fracture. No significant scalp soft tissue swelling. SINUSES/ORBITS: Mild paranasal sinus mucosal thickening. Chronic dehiscence of the RIGHT posterolateral maxillary wall. Mastoid air cells are well aerated.The included ocular globes and orbital contents are non-suspicious. OTHER: Bilateral parotid sialoliths. IMPRESSION: 1. New age indeterminate RIGHT basal ganglia small infarcts. 2. Old RIGHT temporal occipital/PCA territory infarct. 3. Old basal ganglia, thalami, pontine and cerebellar  small infarcts. Moderate to severe chronic small vessel ischemic changes. 4. Moderate parenchymal brain volume loss, advanced for age. Electronically Signed   By: Elon Alas M.D.   On: 07/06/2018 22:16   US Renal  Result Date: 07/07/2018 CLINICAL DATA:  Acute kidney injury, history cardiomyopathy, chronic systolic heart failure, stage III chronic kidney disease, hypertension, COPD, former smoker EXAM: RENAL / URINARY TRACT ULTRASOUND COMPLETE COMPARISON:  09/05/2016 FINDINGS: Right Kidney: Renal measurements: 10.9.7 x 6.1 cm = volume: 200 mL. Cortical thinning. Increased cortical echogenicity. Tiny mid renal cyst 8 x 6.9 mm. Additional peripelvic cyst 3.0 x 3.2 x 3.1 cm, simple features. No additional mass or hydronephrosis. No shadowing calculi. Left Kidney: Renal measurements: 12.3 x 6.6 x 6.6 cm = volume: 276 mL. Cortical thinning. Increased cortical echogenicity. Small cyst at inferior pole 1.0 x 0.9 x 1.2 cm. Additional small cyst at inferior pole 2.0 x 1.6 x 1.9 cm, simple features. No additional mass, hydronephrosis, or shadowing calcification. Bladder: Decompressed, unable to evaluate. IMPRESSION: Medical renal disease changes of both kidneys. Small renal cysts in both kidneys. No evidence of hydronephrosis. Electronically Signed   By: Lavonia Dana M.D.   On: 07/07/2018 19:30   Ir Fluoro Guide Cv Line Right  Result Date: 07/09/2018 INDICATION: Acute uremia, acute kidney injury, no current access for dialysis EXAM: Ultrasound guidance for vascular access Right IJ temporary dialysis catheter (Mahurkar catheter) MEDICATIONS: 1% lidocaine local ANESTHESIA/SEDATION: Moderate Sedation Time: None. The patient's level of consciousness and vital signs were monitored continuously by radiology nursing throughout the procedure under my direct supervision. FLUOROSCOPY TIME:  Fluoroscopy Time: 0 minutes 24 seconds (2 mGy). COMPLICATIONS: None immediate. PROCEDURE: Emergency consent was obtained from the  patient's physician after a thorough discussion of the  procedural risks, benefits and alternatives. All questions were addressed. Maximal Sterile Barrier Technique was utilized including caps, mask, sterile gowns, sterile gloves, sterile drape, hand hygiene and skin antiseptic. A timeout was performed prior to the initiation of the procedure. Under sterile conditions and local anesthesia, ultrasound micropuncture access performed of the right internal jugular vein. Images obtained for documentation of the patent jugular vein. Guidewire advanced easily. Measurements obtained. Four [place] dilator advanced. Amplatz guidewire advanced into the IVC. Tract dilatation performed to insert a temporary dialysis catheter. Tip position at the SVC RA junction. Images obtained for documentation. Catheter secured Prolene sutures. Blood aspirated easily followed by saline and heparin flushes. Appropriate volume and strength of heparin instilled in all lumens. External caps applied. Catheter secured with Prolene sutures and a sterile dressing. No immediate complication. Patient tolerated the procedure well. IMPRESSION: Successful ultrasound and fluoroscopic right IJ temporary dialysis catheter. Tip SVC RA junction. Ready for use. Electronically Signed   By: Jerilynn Mages.  Shick M.D.   On: 07/09/2018 13:06   Ir US Guide Vasc Access Right  Result Date: 07/09/2018 INDICATION: Acute uremia, acute kidney injury, no current access for dialysis EXAM: Ultrasound guidance for vascular access Right IJ temporary dialysis catheter (Mahurkar catheter) MEDICATIONS: 1% lidocaine local ANESTHESIA/SEDATION: Moderate Sedation Time: None. The patient's level of consciousness and vital signs were monitored continuously by radiology nursing throughout the procedure under my direct supervision. FLUOROSCOPY TIME:  Fluoroscopy Time: 0 minutes 24 seconds (2 mGy). COMPLICATIONS: None immediate. PROCEDURE: Emergency consent was obtained from the patient's physician  after a thorough discussion of the procedural risks, benefits and alternatives. All questions were addressed. Maximal Sterile Barrier Technique was utilized including caps, mask, sterile gowns, sterile gloves, sterile drape, hand hygiene and skin antiseptic. A timeout was performed prior to the initiation of the procedure. Under sterile conditions and local anesthesia, ultrasound micropuncture access performed of the right internal jugular vein. Images obtained for documentation of the patent jugular vein. Guidewire advanced easily. Measurements obtained. Four [place] dilator advanced. Amplatz guidewire advanced into the IVC. Tract dilatation performed to insert a temporary dialysis catheter. Tip position at the SVC RA junction. Images obtained for documentation. Catheter secured Prolene sutures. Blood aspirated easily followed by saline and heparin flushes. Appropriate volume and strength of heparin instilled in all lumens. External caps applied. Catheter secured with Prolene sutures and a sterile dressing. No immediate complication. Patient tolerated the procedure well. IMPRESSION: Successful ultrasound and fluoroscopic right IJ temporary dialysis catheter. Tip SVC RA junction. Ready for use. Electronically Signed   By: Jerilynn Mages.  Shick M.D.   On: 07/09/2018 13:06   Dg Chest Port 1 View  Result Date: 07/12/2018 CLINICAL DATA:  Wheezing EXAM: PORTABLE CHEST 1 VIEW COMPARISON:  07/11/2018 FINDINGS: Right IJ approach hemodialysis catheter tip is in the right atrium. Unchanged position of left chest wall single lead AICD. Mild interstitial pulmonary edema. Mild cardiomegaly with findings of prior CABG. Trace left pleural effusion. IMPRESSION: Mild cardiomegaly and mild interstitial pulmonary edema. Electronically Signed   By: Ulyses Jarred M.D.   On: 07/12/2018 04:32   Dg Chest Port 1 View  Result Date: 07/11/2018 CLINICAL DATA:  Shortness of breath EXAM: PORTABLE CHEST 1 VIEW COMPARISON:  07/07/2018 FINDINGS: Left  chest wall AICD lead is in unchanged position. There is mild cardiomegaly. Central pulmonary vascular congestion without overt pulmonary edema. No focal airspace consolidation. Small left pleural effusion. No pneumothorax. IMPRESSION: Cardiomegaly and small left pleural effusion without overt pulmonary edema. Electronically Signed   By: Lennette Bihari  [place] M.D.   On: 07/11/2018 03:35   Dg Chest Port 1 View  Result Date: 07/06/2018 CLINICAL DATA:  Sepsis EXAM: PORTABLE CHEST 1 VIEW COMPARISON:  Portable exam 2023 hours compared to 12/09/2016 FINDINGS: LEFT subclavian AICD with lead projecting over RIGHT ventricle. External pacing lead present. Enlargement of cardiac silhouette with postsurgical changes of CABG. Atherosclerotic calcification aorta. Mediastinal contours normal. Subsegmental atelectasis LEFT lower lobe. Pulmonary vascular congestion with interstitial prominence at the mid to lower lungs which could represent minimal pulmonary edema. No pleural effusion or pneumothorax. Bones demineralized. IMPRESSION: Enlargement of cardiac silhouette with vascular congestion and question minimal pulmonary edema. Electronically Signed   By: Lavonia Dana M.D.   On: 07/06/2018 20:35   Dg Swallowing Func-speech Pathology  Result Date: 07/14/2018 Objective Swallowing Evaluation: Type of Study: MBS-Modified Barium Swallow Study  Patient Details Name: Keith Young MRN: [id number] Date of Birth: May 14, 1951 Today's Date: 07/14/2018 Time: SLP Start Time (ACUTE ONLY): 8 -SLP Stop Time (ACUTE ONLY): 1150 SLP Time Calculation (min) (ACUTE ONLY): 20 min Past Medical History: Past Medical History: Diagnosis Date . AICD (automatic cardioverter/defibrillator) present  . Alcoholism in remission ([place])  . Anemia  . Aortic insufficiency  . Arthritis  . At moderate risk for fall  . Bell's palsy  . Chronic systolic heart failure (HCC)   NYHA Class III . CKD (chronic kidney disease), stage IV ([place])  . COPD (chronic obstructive pulmonary  disease) ([place])  . Essential hypertension, benign  . HIV disease ([place]) 09/06/2016 . Hyperlipidemia  . NICM (nonischemic cardiomyopathy) ([place])  . Noncompliance with medication regimen  . NSVT (nonsustained ventricular tachycardia) ([place])  . Numbness of right jaw   Had a stoke in 01/2013. Numbness is occasional, especially when trying to chew. . OSA (obstructive sleep apnea)  . Productive cough 08/2013  With brown sputum  . S/P aortic valve replacement with bioprosthetic valve  . Stroke ([place]) 01/2013  weakness of right side from CVA . Type 2 diabetes mellitus ([place])  . Uses hearing aid 2014  recently received new hearing aids Past Surgical History: Past Surgical History: Procedure Laterality Date . BIOPSY N/A 04/10/2014  Procedure: GASTRIC BIOPSY;  Surgeon: Daneil Dolin, MD;  Location: [facility] ORS;  Service: Endoscopy;  Laterality: N/A; . BIOPSY  10/12/2015  Procedure: BIOPSY;  Surgeon: Daneil Dolin, MD;  Location: [facility] END[facility] SUITE;  Service: Endoscopy;;  stomach bx's . CARDIAC DEFIBRILLATOR PLACEMENT  11/12/12  [employer] Inogen MINI ICD implanted in [place] at [place] [facility] per Dr Nelly Laurence note . COLONOSCOPY WITH PROPOFOL N/A 04/10/2014  RMR: Colonic Diverticulosis . ESOPHAGOGASTRODUODENOSCOPY (EGD) WITH PROPOFOL N/A 04/10/2014  RMR: Mild erosive reflux esophagitis. Multiple antral polyps likely hyperplastic status post removal by hot snare cautery technique. Diffusely abnormal stomach status post gastric biopsy I suspect some of patients anemaia may be due to intermittent oozing from the stomach. It would be difficult and a risky proposition to attempt complete removal of all of his gastric polyps. . ESOPHAGOGASTRODUODENOSCOPY (EGD) WITH PROPOFOL N/A 10/12/2015  Procedure: ESOPHAGOGASTRODUODENOSCOPY (EGD) WITH PROPOFOL;  Surgeon: Daneil Dolin, MD;  Location: [facility];  Service: Endoscopy;  Laterality: N/A;  8299 - moved to 9:15 . IR FLUORO GUIDE CV LINE RIGHT   07/09/2018 . IR US GUIDE VASC ACCESS RIGHT  07/09/2018 . POLYPECTOMY N/A 04/10/2014  Procedure: GASTRIC POLYPECTOMY;  Surgeon: Daneil Dolin, MD;  Location: [facility];  Service: Endoscopy;  Laterality: N/A; . RIGHT HEART CATHETERIZATION  N/A 02/06/2014  Procedure: RIGHT HEART CATH;  Surgeon: Larey Dresser, MD;  Location: [facility] CATH LAB;  Service: Cardiovascular;  Laterality: N/A; HPI: MICHAI DIEPPA is a 68 y.o. male with PMH significant of HIV, type 2 diabetes, COPD, CVA (19379, HTN, Bells Palsy, systolic CHF, CKD, and history of AICD placement followed by cardiology.  Pt was [facility] to [facility] ED [facility]07[facility]18 and after presenting with AMS at SNF residence, vomitted, and became increasingly unresponsive. Per PA note, pt was found to be encephalopathic secondary to sepsis and renal failure. Pt seen by ST in Jan 2018; BSE recc D2/thin. CXR showed cardiomegaly and small left pleural effusion. Head CT revealed new age indeterminate RIGHT basal ganglia small infarcts, old RIGHT temporal occipital/PCA territory infarct, old basal ganglia, thalami, pontine and cerebellar small infarcts.  Subjective: alert, cooperative Assessment / Plan / Recommendation CHL IP CLINICAL IMPRESSIONS 07/14/2018 Clinical Impression Pt presents with primary moderate oral dysphagia, mild pharyngeal dysphagia. There is lingual pumping, decreased bolus cohesion, piecemeal deglutition and premature spillage (to pyriforms with liquids, valleculae with solids). Premature spillage results in intermittent deep larygneal penetration of thin, nectar, and honey thick liquids, with aspiration (sensed and silent) of thin and nectar. Due to impulsivity pt at risk for aspiration with liquids of any consistency. With solids, pt without rotary chew, lingual thrusting noted with poor cohesion and spillage of solids to the valleculae. SLP used straw to facilitate chin tuck, which prevented aspiration with thin liquids, including small and large sips and consecutive boluses.  There was trace shallow penetration with chin tuck which reversed with swallow. Brief stasis of barium tablet in mid-thoracic esophagus, cleared pt's GE junction with liquid wash. Recommend dys 2, thin liquids, meds whole in puree; pt must tuck chin when swallowing for airway protection. Recommend full supervision due to level of cuing necessary. SLP will follow for tolerance and training in swallow precautions/compensations.  SLP Visit Diagnosis Dysphagia, oral phase (R13.11);Dysphagia, pharyngeal phase (R13.13) Attention and concentration deficit following -- Frontal lobe and executive function deficit following -- Impact on safety and function Moderate aspiration risk   CHL IP TREATMENT RECOMMENDATION 07/14/2018 Treatment Recommendations Therapy as outlined in treatment plan below   Prognosis 07/14/2018 Prognosis for Safe Diet Advancement Good Barriers to Reach Goals Cognitive deficits Barriers/Prognosis Comment -- CHL IP DIET RECOMMENDATION 07/14/2018 SLP Diet Recommendations Dysphagia 2 (Fine chop) solids;Thin liquid Liquid Administration via Straw Medication Administration Whole meds with puree Compensations Slow rate;Small sips/bites;Chin tuck;Clear throat intermittently;Use straw to facilitate chin tuck Postural Changes --   CHL IP OTHER RECOMMENDATIONS 07/14/2018 Recommended Consults -- Oral Care Recommendations Oral care BID Other Recommendations --   CHL IP FOLLOW UP RECOMMENDATIONS 07/14/2018 Follow up Recommendations Skilled Nursing facility   [facility] IP FREQUENCY AND DURATION 07/14/2018 Speech Therapy Frequency (ACUTE ONLY) min 2x/week Treatment Duration 2 weeks      CHL IP ORAL PHASE 07/14/2018 Oral Phase Impaired Oral - Pudding Teaspoon -- Oral - Pudding Cup -- Oral - Honey Teaspoon NT Oral - Honey Cup Weak lingual manipulation;Lingual pumping;Reduced posterior propulsion;Piecemeal swallowing;Decreased bolus cohesion;Premature spillage Oral - Nectar Teaspoon Weak lingual manipulation;Lingual pumping;Reduced  posterior propulsion;Piecemeal swallowing;Decreased bolus cohesion;Premature spillage Oral - Nectar Cup Weak lingual manipulation;Lingual pumping;Reduced posterior propulsion;Piecemeal swallowing;Decreased bolus cohesion;Premature spillage Oral - Nectar Straw Weak lingual manipulation;Lingual pumping;Reduced posterior propulsion;Piecemeal swallowing;Delayed oral transit;Decreased bolus cohesion Oral - Thin Teaspoon Lingual pumping;Reduced posterior propulsion;Piecemeal swallowing;Decreased bolus cohesion;Premature spillage Oral - Thin Cup Weak lingual manipulation;Lingual pumping;Reduced posterior propulsion;Piecemeal swallowing;Decreased bolus cohesion;Premature spillage Oral - Thin Straw  Weak lingual manipulation;Reduced posterior propulsion;Piecemeal swallowing;Decreased bolus cohesion;Premature spillage Oral - Puree Weak lingual manipulation;Lingual pumping;Reduced posterior propulsion;Piecemeal swallowing;Decreased bolus cohesion;Premature spillage Oral - Mech Soft Impaired mastication;Weak lingual manipulation;Lingual pumping;Reduced posterior propulsion;Lingual/palatal residue;Piecemeal swallowing;Delayed oral transit;Premature spillage;Decreased bolus cohesion Oral - Regular -- Oral - Multi-Consistency -- Oral - Pill Other (Comment) Oral Phase - Comment --  CHL IP PHARYNGEAL PHASE 07/14/2018 Pharyngeal Phase Impaired Pharyngeal- Pudding Teaspoon -- Pharyngeal -- Pharyngeal- Pudding Cup -- Pharyngeal -- Pharyngeal- Honey Teaspoon -- Pharyngeal -- Pharyngeal- Honey Cup Penetration/Aspiration before swallow;Penetration/Aspiration during swallow Pharyngeal Material enters airway, remains ABOVE vocal cords then ejected out;Material enters airway, remains ABOVE vocal cords and not ejected out Pharyngeal- Nectar Teaspoon -- Pharyngeal -- Pharyngeal- Nectar Cup Penetration/Aspiration before swallow;Penetration/Aspiration during swallow;Trace aspiration Pharyngeal Material does not enter airway;Material enters airway,  CONTACTS cords and not ejected out;Material enters airway, passes BELOW cords without attempt by patient to eject out (silent aspiration) Pharyngeal- Nectar Straw Penetration/Aspiration before swallow;Penetration/Aspiration during swallow;Trace aspiration Pharyngeal Material enters airway, CONTACTS cords and not ejected out;Material enters airway, passes BELOW cords without attempt by patient to eject out (silent aspiration) Pharyngeal- Thin Teaspoon Penetration/Aspiration before swallow;Penetration/Aspiration during swallow;Moderate aspiration Pharyngeal Material enters airway, passes BELOW cords and not ejected out despite cough attempt by patient Pharyngeal- Thin Cup Penetration/Aspiration before swallow;Penetration/Aspiration during swallow;Moderate aspiration Pharyngeal Material does not enter airway;Material enters airway, CONTACTS cords and not ejected out;Material enters airway, passes BELOW cords and not ejected out despite cough attempt by patient;Material enters airway, passes BELOW cords without attempt by patient to eject out (silent aspiration) Pharyngeal- Thin Straw Penetration/Aspiration during swallow;Other (Comment) Pharyngeal Material does not enter airway;Material enters airway, remains ABOVE vocal cords then ejected out Pharyngeal- Puree WFL Pharyngeal -- Pharyngeal- Mechanical Soft WFL Pharyngeal -- Pharyngeal- Regular -- Pharyngeal -- Pharyngeal- Multi-consistency -- Pharyngeal -- Pharyngeal- Pill Other (Comment) Pharyngeal -- Pharyngeal Comment --  CHL IP CERVICAL ESOPHAGEAL PHASE 07/14/2018 Cervical Esophageal Phase WFL Pudding Teaspoon -- Pudding Cup -- Honey Teaspoon -- Honey Cup -- Nectar Teaspoon -- Nectar Cup -- Nectar Straw -- Thin Teaspoon -- Thin Cup -- Thin Straw -- Puree -- Mechanical Soft -- Regular -- Multi-consistency -- Pill -- Cervical Esophageal Comment -- Deneise Lever, MS, CCC-SLP Speech-Language Pathologist Acute Rehabilitation Services Pager: [phone] Office:  [phone] Aliene Altes 07/14/2018, 1:46 PM              Vas [place] Upper Ext Vein Mapping (pre-op Avf)  Result Date: 07/18/2018 UPPER EXTREMITY VEIN MAPPING  Indications: Pre-access. Performing Technologist: Abram Sander RVS  Examination Guidelines: A complete evaluation includes B-mode imaging, spectral Doppler, color Doppler, and power Doppler as needed of all accessible portions of each vessel. Bilateral testing is considered an integral part of a complete examination. Limited examinations for reoccurring indications may be performed as noted. +-----------------+-------------+----------+--------------+ Right Cephalic   Diameter (cm)Depth (cm)   Findings    +-----------------+-------------+----------+--------------+ Shoulder             0.50        1.23                  +-----------------+-------------+----------+--------------+ Prox upper arm       0.55        0.84                  +-----------------+-------------+----------+--------------+ Mid upper arm        0.53        0.27                  +-----------------+-------------+----------+--------------+  Dist upper arm       0.41        0.41                  +-----------------+-------------+----------+--------------+ Antecubital fossa    0.50        0.29                  +-----------------+-------------+----------+--------------+ Prox forearm         0.24        0.22     branching    +-----------------+-------------+----------+--------------+ Mid forearm                             not visualized +-----------------+-------------+----------+--------------+ Dist forearm                            not visualized +-----------------+-------------+----------+--------------+ +-----------------+-------------+----------+--------+ Right Basilic    Diameter (cm)Depth (cm)Findings +-----------------+-------------+----------+--------+ Prox upper arm       0.36        1.36             +-----------------+-------------+----------+--------+ Mid upper arm        0.44        1.66            +-----------------+-------------+----------+--------+ Dist upper arm       0.56        0.95            +-----------------+-------------+----------+--------+ Antecubital fossa    0.64        0.50            +-----------------+-------------+----------+--------+ Prox forearm         0.29        0.21            +-----------------+-------------+----------+--------+ Mid forearm          0.32        0.20            +-----------------+-------------+----------+--------+ Distal forearm       0.30        0.20            +-----------------+-------------+----------+--------+ +-----------------+-------------+----------+---------+ Left Cephalic    Diameter (cm)Depth (cm)Findings  +-----------------+-------------+----------+---------+ Shoulder             0.40        0.98             +-----------------+-------------+----------+---------+ Prox upper arm       0.43        0.62             +-----------------+-------------+----------+---------+ Mid upper arm        0.41        0.36             +-----------------+-------------+----------+---------+ Dist upper arm       0.39        0.37             +-----------------+-------------+----------+---------+ Antecubital fossa    0.50        0.34   branching +-----------------+-------------+----------+---------+ Prox forearm         0.31        0.18             +-----------------+-------------+----------+---------+ Mid forearm          0.29        0.19             +-----------------+-------------+----------+---------+  Dist forearm         0.29        0.17             +-----------------+-------------+----------+---------+ +-----------------+-------------+----------+--------------+ Left Basilic     Diameter (cm)Depth (cm)   Findings    +-----------------+-------------+----------+--------------+ Prox upper arm        0.70        1.48                  +-----------------+-------------+----------+--------------+ Mid upper arm        0.57        1.30     branching    +-----------------+-------------+----------+--------------+ Dist upper arm                          not visualized +-----------------+-------------+----------+--------------+ Antecubital fossa                       not visualized +-----------------+-------------+----------+--------------+ Prox forearm                            not visualized +-----------------+-------------+----------+--------------+ Mid forearm                             not visualized +-----------------+-------------+----------+--------------+ Distal forearm                          not visualized +-----------------+-------------+----------+--------------+ *See table(s) above for measurements and observations.  Diagnosing physician:    Preliminary    Dg Fluoro Guide Lumbar Puncture  Result Date: 07/10/2018 CLINICAL DATA:  68 year old male with encephalopathy. Subsequent encounter. EXAM: DIAGNOSTIC LUMBAR PUNCTURE UNDER FLUOROSCOPIC GUIDANCE FLUOROSCOPY TIME:  Fluoroscopy Time:  6 seconds PROCEDURE: Patient was not able to give consent. Family (sister) not available by phone. Emergency request/consent from Dr. Candiss Norse. Labs and head CT reviewed. Under fluoroscopic guidance and aseptic technique (utilizing 1% lidocaine as local anesthetic), L3-4 lumbar puncture was performed with a single pass of a 22 gauge spinal needle. Opening pressure 26 and closing pressure 20 cc water (not felt to be accurate as patient was lying on the stomach and moving throughout the exam). 8.5 cc of clear cerebral spinal fluid collected and sent for labs. Patient tolerated procedure well without any difficulty. IMPRESSION: Successful L3-4 lumbar puncture as noted above. Electronically Signed   By: Genia Del M.D.   On: 07/10/2018 15:12     LOS: 11 days   Oren Binet, MD  [facility]  If 7PM-7AM, please contact night-coverage  Please page via [email or web address]  Go to [email or web address] and use [place]'s universal password to access. If you do not have the password, please contact the hospital operator.  Locate the [facility] provider you are looking for under [facility] and page to a number that you can be directly reached. If you still have difficulty reaching the provider, please page the [facility] (Director on Call) for the Hospitalists listed on amion for assistance.  07/18/2018, 5:26 PM

## 2018-07-18 NOTE — Progress Notes (Signed)
[facility]   SUBJECTIVE:    HD yesterday, 1.7 L ultrafiltration  0.4 L urine output yesterday  K3.7 this morning postdialysis, hemoglobin 12.2  Patient without complaints  Continues to have a temporary HD catheter  Blood cultures from 2/10 no growth final   OBJECTIVE:  Vitals:   07/18/18 0444 07/18/18 0743  BP: 135/77   Pulse: 75   Resp:    Temp: 98.4 F (36.9 C)   SpO2: 100% 94%    Intake/Output Summary (Last 24 hours) at 07/18/2018 1207 Last data filed at 07/17/2018 2119 Gross per 24 hour  Intake 250 ml  Output 1650 ml  Net -1400 ml      Genearl: comfortably laying at 30 deg, arouses to voice, localizes, answers simple questions. Neck:  RIJ temp cath c/d/i CV:  RRR  No rub Lungs CTAB Abd:  abd SNT/ND with normal BS GU:  Bladder non-palpable, Foley catheter in place Extremities:  No LE edema. Skin:  No skin rash   MEDICATIONS:  . bictegravir-emtricitabine-tenofovir AF  1 tablet Oral Daily  . chlorhexidine  15 mL Mouth Rinse BID  . Chlorhexidine Gluconate Cloth  6 each Topical Q0600  . insulin aspart  0-9 Units Subcutaneous Q4H  . mouth rinse  15 mL Mouth Rinse q12n4p  . metoprolol succinate  25 mg Oral Daily  . mometasone-formoterol  2 puff Inhalation BID  . predniSONE  40 mg Oral Q breakfast  . warfarin  5 mg Oral ONCE-1800  . warfarin   Does not apply Once  . Warfarin - Pharmacist Dosing Inpatient   Does not apply q1800       LABS:   CBC Latest Ref Rng & Units 07/18/2018 07/17/2018 07/16/2018  WBC 4.0 - 10.5 K/uL 6.9 3.1(L) 2.8(L)  Hemoglobin 13.0 - 17.0 g/dL 12.2(L) 11.8(L) 11.3(L)  Hematocrit 39.0 - 52.0 % 38.7(L) 37.3(L) 35.4(L)  Platelets 150 - 400 K/uL 127(L) 103(L) 89(L)    CMP Latest Ref Rng & Units 07/18/2018 07/17/2018 07/16/2018  Glucose 70 - 99 mg/dL 93 114(H) 135(H)  BUN 8 - 23 mg/dL 48(H) 95(H) 87(H)  Creatinine 0.61 - 1.24 mg/dL 3.74(H) 6.00(H) 5.94(H)  Sodium 135 - 145 mmol/L 140 139 139  Potassium 3.5 - 5.1  mmol/L 3.7 3.5 3.7  Chloride 98 - 111 mmol/L 105 103 104  CO2 22 - 32 mmol/L 25 23 22   Calcium 8.9 - 10.3 mg/dL 7.0(L) 6.4(LL) 6.4(LL)  Total Protein 6.5 - 8.1 g/dL - - 5.5(L)  Total Bilirubin 0.3 - 1.2 mg/dL - - 0.5  Alkaline Phos 38 - 126 U/L - - 62  AST 15 - 41 U/L - - 31  ALT 0 - 44 U/L - - 36    Lab Results  Component Value Date   PTH 52 04/17/2018   CALCIUM 7.0 (L) 07/18/2018   CAION 1.27 (H) 11/28/2015   PHOS 3.4 04/17/2018       Component Value Date/Time   COLORURINE AMBER (A) 07/08/2018 0839   APPEARANCEUR CLOUDY (A) 07/08/2018 0839   LABSPEC 1.016 07/08/2018 0839   PHURINE 5.0 07/08/2018 0839   GLUCOSEU NEGATIVE 07/08/2018 0839   HGBUR SMALL (A) 07/08/2018 0839   BILIRUBINUR NEGATIVE 07/08/2018 0839   KETONESUR 5 (A) 07/08/2018 0839   PROTEINUR 100 (A) 07/08/2018 0839   UROBILINOGEN 0.2 10/23/2013 1545   NITRITE NEGATIVE 07/08/2018 0839   LEUKOCYTESUR LARGE (A) 07/08/2018 0839      Component Value Date/Time   PHART 7.346 (L) 07/08/2018 1235   PCO2ART  32.0 07/08/2018 1235   PO2ART 86.4 07/08/2018 1235   HCO3 17.0 (L) 07/08/2018 1235   TCO2 25 11/28/2015 1304   ACIDBASEDEF 7.5 (H) 07/08/2018 1235   O2SAT 95.9 07/08/2018 1235       Component Value Date/Time   IRON 40 (L) 04/17/2018 0734   TIBC 260 04/17/2018 0734   FERRITIN 39 04/17/2018 0734   IRONPCTSAT 15 (L) 04/17/2018 0734       ASSESSMENT/PLAN:      1.  Acute on chronic stage IV renal disease; probable baseline renal function 2.4-2.8 and etiology is hypertension, diabetes.   Acute insult likely ATN during E. coli sepsis.  Started HD 2/10.  At this time little to suggest recover GFR.  Remains dialysis dependent.  I think we should pursue outpatient hemodialysis as an AKI treatment.  Will need tunneled catheter, will discuss with [facility]  2.  Sepsis secondary to E coli in urine and blood.  Continue antibiotics per ID - plan 2 wk course.  TEE Ok.  CT A/P showed diverticula.  Blood cultures negative from  2/10  3.  Metabolic encephalopathy.  Likely secondary to sepsis complicated by uremia.  LP not c/w infection.  Improving daily, slow process though.   4.  Hyperkalemia.  resolved  5.  Bioprosthetic AVR, Chronic dual HF with EF , A fib with RVR:  Cardiology following.  Anticoagulation on hold in light of thrombocytopenia, rec start after patient has permanent access  7. HIV: on biktarvy   Pearson Grippe MD

## 2018-07-18 NOTE — Progress Notes (Signed)
Patient CBG 318.  No night coverage.  Notified Lamar Blinks, NP.  Will continue to monitor the patient and notify as needed.

## 2018-07-19 ENCOUNTER — Other Ambulatory Visit (HOSPITAL_COMMUNITY): Payer: Medicare HMO

## 2018-07-19 ENCOUNTER — Encounter (HOSPITAL_COMMUNITY): Payer: Self-pay | Admitting: Anesthesiology

## 2018-07-19 LAB — GLUCOSE, CAPILLARY
Glucose-Capillary: 97 mg/dL (ref 70–99)
Glucose-Capillary: 163 mg/dL — ABNORMAL HIGH (ref 70–99)
Glucose-Capillary: 284 mg/dL — ABNORMAL HIGH (ref 70–99)

## 2018-07-19 LAB — BASIC METABOLIC PANEL
Anion gap: 10 (ref 5–15)
BUN: 59 mg/dL — AB (ref 8–23)
CO2: 24 mmol/L (ref 22–32)
Calcium: 6.8 mg/dL — ABNORMAL LOW (ref 8.9–10.3)
Chloride: 106 mmol/L (ref 98–111)
Creatinine, Ser: 4.61 mg/dL — ABNORMAL HIGH (ref 0.61–1.24)
GFR calc Af Amer: 14 mL/min — ABNORMAL LOW (ref >60)
GFR calc non Af Amer: 12 mL/min — ABNORMAL LOW (ref >60)
GLUCOSE: 113 mg/dL — AB (ref 70–99)
Potassium: 3.3 mmol/L — ABNORMAL LOW (ref 3.5–5.1)
Sodium: 140 mmol/L (ref 135–145)

## 2018-07-19 LAB — PROTIME-INR
INR: 1.17
Prothrombin Time: 14.8 seconds (ref 11.4–15.2)

## 2018-07-19 MED ORDER — ENSURE ENLIVE PO LIQD
237.0000 mL | Freq: Two times a day (BID) | ORAL | Status: DC
  Administered 2018-07-21 – 2018-07-25 (×7): 237 mL via ORAL

## 2018-07-19 MED ORDER — RENA-VITE PO TABS
1.0000 | ORAL_TABLET | Freq: Every day | ORAL | Status: DC
  Administered 2018-07-19 – 2018-07-25 (×7): 1 via ORAL
  Filled 2018-07-19 (×7): qty 1

## 2018-07-19 MED ORDER — WARFARIN SODIUM 7.5 MG PO TABS: 7.5000 mg | ORAL_TABLET | Freq: Once | ORAL | Status: DC

## 2018-07-19 MED ORDER — SODIUM CHLORIDE 0.9 % IV SOLN: 1.5000 g | INTRAVENOUS | Status: DC

## 2018-07-19 NOTE — Progress Notes (Signed)
Initial Nutrition Assessment  DOCUMENTATION CODES:   Non-severe (moderate) malnutrition in context of chronic illness  INTERVENTION:   Add Renal MVI   Magic cup TID with meals, each supplement provides 290 kcal and 9 grams of protein  Try Ensure Enlive po BID, each supplement provides 350 kcal and 20 grams of protein. Ensure will need to be thicken to honey consistency prior to administration and may not be well accepted by pt  Snacks TID between meals  Recommend checking phosphorus; consider checking iPTH    NUTRITION DIAGNOSIS:   Moderate Malnutrition related to chronic illness(CKD IV, CHF, HIV, COPD) as evidenced by mild fat depletion, mild muscle depletion.  GOAL:   Patient will meet greater than or equal to 90% of their needs  MONITOR:   PO intake, Supplement acceptance, Labs  REASON FOR ASSESSMENT:   Consult Assessment of nutrition requirement/status  ASSESSMENT:   68 yo male admitted with sepsis due to E.coli bacteremia, acute metabolic/uremic encephalopathy, AKI on CKD stage IV requiring initiation of HD. PMH includes CKD IV, HTN, CHF EF 30%, AICD, HIV, DM, COPD, CVA with right sided weakness, diverticulosis  2/08 Admit 2/10 Temp HD cath placed, HD initiated 2/11 LP-CSF not consistent with meningitis 2/13 TEE without vegetation  Plan for left arm AV fistula vs graft and conversion to [facility] on Friday Noted UOP greatly improved, nephrology holding HD, obtaining labs and evaluating first thing in AM to see if pt still needs permanent access  Recorded po intake 25-75% of meals. RN reports pt with poor appetite and not eating well. Pt does not like the thickened liquids. On visit today, Breakfast tray in pt room but pushed off to the side. It was around 1100 and pt had not eaten breakfast yet. Tray cold, RN notified and called down for early lunch tray. Offered pt pudding in the mean time, pt declined.   Pt reports appetite is good. Pt reports at home eating 3 meals  per day. Pt reports breakfast is typically eggs, grits and bacon. Pt unable to specify what he eats for lunch or dinner. Pt reports he does snack throughout the day but again unable to specify. Pt denies taking ONS at baseline.   Pt reports his height is "5 foot something" but does not know exact height. Pt does not know his UBW but reports he has lost weight.   Pt is edentulous, does not have dentures. Pt reports at baseline he does not have any difficulty chewing most foods.   Noted episodes of hypoglycemia; Diabetes Coordinator medication recommendations noted  Last HD 2/19 with UF 1.7 L; noted 1 L UOP yesterday Current wt 73.5 kg; admission wt 74.5 kg on 2/07. Weight on 2/10 78 kg.  Overall weight trend down since 2/10 post initiation of HD.  Net + per I/O flow sheet but noted multiple unmeasured urine occurrences.   No Phosphorus or iPTH checked this admission. Hyperkalemia improved  Labs: CBGs 58-318, potassium 3.3 (L), serum calcium 6.6 today (albumin 2.0 on 2/17-corrected calcium 8.2) Meds: prednisone, ss novolog  NUTRITION - FOCUSED PHYSICAL EXAM:    Most Recent Value  Orbital Region  Mild depletion  Upper Arm Region  Mild depletion  Thoracic and Lumbar Region  No depletion  Buccal Region  Mild depletion  Temple Region  Severe depletion  Clavicle Bone Region  Moderate depletion  Clavicle and Acromion Bone Region  Mild depletion  Scapular Bone Region  Mild depletion  Dorsal Hand  Mild depletion  Patellar Region  Mild depletion  Anterior Thigh Region  Mild depletion  Posterior Calf Region  Mild depletion  Edema (RD Assessment)  Mild  Hair  Reviewed  Eyes  Reviewed  Mouth  Reviewed  Skin  Reviewed  Nails  Reviewed       Diet Order:   Diet Order            Diet NPO time specified Except for: Sips with Meds  Diet effective midnight        DIET DYS 2 Room service appropriate? Yes; Fluid consistency: Honey Thick  Diet effective now              EDUCATION NEEDS:    Not appropriate for education at this time  Skin:  Skin Assessment: Reviewed RN Assessment  Last BM:  2/19  Height:   Ht Readings from Last 1 Encounters:  07/06/18 5\' 3"  (1.6 m)    Weight:   Wt Readings from Last 1 Encounters:  07/17/18 73.5 kg    Ideal Body Weight:  56.4 kg  BMI:  Body mass index is 28.7 kg/m.  Estimated Nutritional Needs:   Kcal:  2100-2300 kcals   Protein:  105-115 g  Fluid:  1000 mL plus UOP    [employer] MS, RD, LDN, CNSC [phone] Pager  [phone] Weekend/On-Call Pager

## 2018-07-19 NOTE — NC FL2 (Signed)
[place] LEVEL OF CARE SCREENING TOOL     IDENTIFICATION  Patient Name: Keith Young Birthdate: 1950/07/18 Sex: male Admission Date (Current Location): 07/06/2018  [facility] and [place] Number:  Herbalist and Address:  [facility], [place] [address], [place], [place] [address]      Provider Number: [id number]  Attending Physician Name and Address:  Jonetta Osgood, MD  Relative Name and Phone Number:  Jeronimo Greaves (Relative)Gennerine [name] (Sister)    Current Level of Care: Hospital Recommended Level of Care: [place] Prior Approval Number:    Date Approved/Denied:   PASRR Number: [id number]  Discharge Plan: SNF    Current Diagnoses: Patient Active Problem List   Diagnosis Date Noted  . Diverticulosis   . Encephalitis   . ARF (acute renal failure) ([place])   . Sepsis with encephalopathy and septic shock ([place])   . Tachycardia   . E coli bacteremia   . Septic shock ([place]) 07/07/2018  . Acute metabolic encephalopathy 34/28/7681  . Chronic combined systolic and diastolic heart failure ([place]) 12/09/2016  . COPD (chronic obstructive pulmonary disease) ([place]) 09/12/2016  . HIV disease ([place]) 09/06/2016  . Acute kidney injury ([place]) 09/04/2016  . CVA (cerebral vascular accident) ([place]) 06/26/2016  . ICD (implantable cardioverter-defibrillator) in place 01/22/2015  . Gastric polyp   . Reflux esophagitis   . Anemia in chronic kidney disease 03/26/2014  . Carotid bruit 12/18/2013  . Chronic kidney disease (CKD), active medical management without dialysis, stage 3 (moderate) ([place]) 10/23/2013  . Diabetes mellitus type 2 in nonobese ([place]) 10/23/2013  . Essential hypertension, benign 10/23/2013  . CAD (coronary artery disease) 10/23/2013    Orientation RESPIRATION BLADDER Height & Weight     Self  Normal Incontinent, External catheter Weight: 73.5 kg Height:  5\' 3"  (160 cm)  BEHAVIORAL SYMPTOMS/MOOD NEUROLOGICAL  BOWEL NUTRITION STATUS      Incontinent Diet(Please see DC Summary)  AMBULATORY STATUS COMMUNICATION OF NEEDS Skin   Extensive Assist Verbally Normal                       Personal Care Assistance Level of Assistance  Bathing, Feeding, Dressing Bathing Assistance: Maximum assistance Feeding assistance: Limited assistance Dressing Assistance: Maximum assistance     Functional Limitations Info  Sight, Hearing, Speech Sight Info: Adequate Hearing Info: Adequate Speech Info: Adequate    SPECIAL CARE FACTORS FREQUENCY  PT (By licensed PT), OT (By licensed OT)     PT Frequency: 5x/week OT Frequency: 3x/week            Contractures Contractures Info: Not present    Additional Factors Info  Code Status, Allergies, Isolation Precautions Code Status Info: DNR Allergies Info: NKA     Isolation Precautions Info: Enteric precautions     Current Medications (07/19/2018):  This is the current hospital active medication list Current Facility-Administered Medications  Medication Dose Route Frequency Provider Last Rate Last Dose  . 0.9 %  sodium chloride infusion   Intravenous PRN Aline August, MD   Stopped at 07/15/18 0106  . acetaminophen (TYLENOL) tablet 650 mg  650 mg Oral Q6H PRN Etta Quill, DO   650 mg at 07/10/18 2110   Or  . acetaminophen (TYLENOL) suppository 650 mg  650 mg Rectal Q6H PRN Etta Quill, DO      . albuterol (PROVENTIL) (2.5 MG/3ML) 0.083% nebulizer solution 2.5 mg  2.5 mg Nebulization Q4H PRN Kirby-Graham,  Karsten Fells, NP   2.5 mg at 07/12/18 0445  . bictegravir-emtricitabine-tenofovir AF (BIKTARVY) 50-200-25 MG per tablet 1 tablet  1 tablet Oral Daily Tommy Medal, Lavell Islam, MD   1 tablet at 07/19/18 1100  . cefTAZidime (FORTAZ) 1 g in sodium chloride 0.9 % 100 mL IVPB  1 g Intravenous Q24H Tommy Medal, Lavell Islam, MD 200 mL/hr at 07/18/18 2220 1 g at 07/18/18 2220  . [START ON 07/20/2018] cefUROXime (ZINACEF) 1.5 g in sodium chloride 0.9 % 100 mL IVPB   1.5 g Intravenous On Call to OR Ulyses Amor, PA-C      . Chlorhexidine Gluconate Cloth 2 % PADS 6 each  6 each Topical Q0600 Finnigan, Izora Gala A, DO   6 each at 07/19/18 [phone]  . insulin aspart (novoLOG) injection 0-9 Units  0-9 Units Subcutaneous TID WC Ghimire, Henreitta Leber, MD      . MEDLINE mouth rinse  15 mL Mouth Rinse BID Jonetta Osgood, MD   15 mL at 07/19/18 1100  . metoprolol succinate (TOPROL-XL) 24 hr tablet 25 mg  25 mg Oral Daily Alekh, Kshitiz, MD   25 mg at 07/19/18 1100  . metoprolol tartrate (LOPRESSOR) injection 5 mg  5 mg Intravenous Q6H PRN Georgette Shell, MD      . mometasone-formoterol [facility]) 100-5 MCG/ACT inhaler 2 puff  2 puff Inhalation BID Aline August, MD   2 puff at 07/19/18 0840  . ondansetron (ZOFRAN) tablet 4 mg  4 mg Oral Q6H PRN Etta Quill, DO       Or  . ondansetron [facility]) injection 4 mg  4 mg Intravenous Q6H PRN Etta Quill, DO   4 mg at 07/07/18 0940  . predniSONE (DELTASONE) tablet 40 mg  40 mg Oral Q breakfast Starla Link, Kshitiz, MD   40 mg at 07/19/18 0900  . [place]   Oral PRN Aline August, MD      . sodium chloride flush (NS) 0.9 % injection 10-40 mL  10-40 mL Intracatheter PRN Starla Link, Kshitiz, MD   20 mL at 07/19/18 0445  . warfarin (COUMADIN) video   Does not apply Once Nico, Syme, [facility]         Discharge Medications: Please see discharge summary for a list of discharge medications.  Relevant Imaging Results:  Relevant Lab Results:   Additional Information SSN: [id number]. New Dialysis.   Benard Halsted, LCSW

## 2018-07-19 NOTE — H&P (View-Only) (Signed)
   Plan is for left arm AV fistula versus graft and conversion to tunneled dialysis catheter tomorrow in OR with Dr. Donnetta Hutching.  N.p.o. past midnight.  Devereaux Grayson C. Donzetta Matters, MD [facility] [place] Office: [phone] Pager: [phone]

## 2018-07-19 NOTE — Progress Notes (Signed)
[facility]   SUBJECTIVE:    No interval events, seen by vascular surgery plan for permanent access on 2/21.  Receiving outpatient clip as AKI  1 L urine output yesterday, last HD 2/18  No a.m. labs  Patient without complaints  Blood cultures from 2/10 no growth final   OBJECTIVE:  Vitals:   07/19/18 0505 07/19/18 0840  BP: 136/74   Pulse: 63   Resp: 17   Temp: 97.9 F (36.6 C)   SpO2: 99% 95%    Intake/Output Summary (Last 24 hours) at 07/19/2018 1133 Last data filed at 07/19/2018 0656 Gross per 24 hour  Intake 240 ml  Output 1050 ml  Net -810 ml      Genearl: comfortably laying at 30 deg, arouses to voice, localizes, answers simple questions. Neck:  RIJ temp cath c/d/i CV:  RRR  No rub Lungs CTAB Abd:  abd SNT/ND with normal BS GU:  Bladder non-palpable, Foley catheter in place Extremities:  No LE edema. Skin:  No skin rash   MEDICATIONS:  . bictegravir-emtricitabine-tenofovir AF  1 tablet Oral Daily  . Chlorhexidine Gluconate Cloth  6 each Topical Q0600  . insulin aspart  0-9 Units Subcutaneous TID WC  . mouth rinse  15 mL Mouth Rinse BID  . metoprolol succinate  25 mg Oral Daily  . mometasone-formoterol  2 puff Inhalation BID  . predniSONE  40 mg Oral Q breakfast  . warfarin  7.5 mg Oral ONCE-1800  . warfarin   Does not apply Once  . Warfarin - Pharmacist Dosing Inpatient   Does not apply q1800       LABS:   CBC Latest Ref Rng & Units 07/18/2018 07/17/2018 07/16/2018  WBC 4.0 - 10.5 K/uL 6.9 3.1(L) 2.8(L)  Hemoglobin 13.0 - 17.0 g/dL 12.2(L) 11.8(L) 11.3(L)  Hematocrit 39.0 - 52.0 % 38.7(L) 37.3(L) 35.4(L)  Platelets 150 - 400 K/uL 127(L) 103(L) 89(L)    CMP Latest Ref Rng & Units 07/18/2018 07/17/2018 07/16/2018  Glucose 70 - 99 mg/dL 93 114(H) 135(H)  BUN 8 - 23 mg/dL 48(H) 95(H) 87(H)  Creatinine 0.61 - 1.24 mg/dL 3.74(H) 6.00(H) 5.94(H)  Sodium 135 - 145 mmol/L 140 139 139  Potassium 3.5 - 5.1 mmol/L 3.7 3.5 3.7  Chloride 98  - 111 mmol/L 105 103 104  CO2 22 - 32 mmol/L 25 23 22   Calcium 8.9 - 10.3 mg/dL 7.0(L) 6.4(LL) 6.4(LL)  Total Protein 6.5 - 8.1 g/dL - - 5.5(L)  Total Bilirubin 0.3 - 1.2 mg/dL - - 0.5  Alkaline Phos 38 - 126 U/L - - 62  AST 15 - 41 U/L - - 31  ALT 0 - 44 U/L - - 36    Lab Results  Component Value Date   PTH 52 04/17/2018   CALCIUM 7.0 (L) 07/18/2018   CAION 1.27 (H) 11/28/2015   PHOS 3.4 04/17/2018       Component Value Date/Time   COLORURINE AMBER (A) 07/08/2018 0839   APPEARANCEUR CLOUDY (A) 07/08/2018 0839   LABSPEC 1.016 07/08/2018 0839   PHURINE 5.0 07/08/2018 0839   GLUCOSEU NEGATIVE 07/08/2018 0839   HGBUR SMALL (A) 07/08/2018 0839   BILIRUBINUR NEGATIVE 07/08/2018 0839   KETONESUR 5 (A) 07/08/2018 0839   PROTEINUR 100 (A) 07/08/2018 0839   UROBILINOGEN 0.2 10/23/2013 1545   NITRITE NEGATIVE 07/08/2018 0839   LEUKOCYTESUR LARGE (A) 07/08/2018 0839      Component Value Date/Time   PHART 7.346 (L) 07/08/2018 1235   PCO2ART 32.0 07/08/2018  1235   PO2ART 86.4 07/08/2018 1235   HCO3 17.0 (L) 07/08/2018 1235   TCO2 25 11/28/2015 1304   ACIDBASEDEF 7.5 (H) 07/08/2018 1235   O2SAT 95.9 07/08/2018 1235       Component Value Date/Time   IRON 40 (L) 04/17/2018 0734   TIBC 260 04/17/2018 0734   FERRITIN 39 04/17/2018 0734   IRONPCTSAT 15 (L) 04/17/2018 0734       ASSESSMENT/PLAN:      1.  Acute on chronic stage IV renal disease; probable baseline renal function 2.4-2.8 and etiology is hypertension, diabetes.   Acute insult likely ATN during E. coli sepsis.  Started HD 2/10.  Remains dialysis dependent however urine output yesterday increased to greater than 1 L.  Will check labs today and evaluate first thing tomorrow to see if still needs permanent access.  Hold dialysis which was tentatively planned for today.   2.  Sepsis secondary to E coli in urine and blood.  Continue antibiotics per ID - plan 2 wk course.  TEE Ok.  CT A/P showed diverticula.  Blood cultures  negative from 2/10  3.  Metabolic encephalopathy.  Likely secondary to sepsis complicated by uremia.  LP not c/w infection.  Improving daily, slow process though.   4.  Hyperkalemia.  resolved  5.  Bioprosthetic AVR, Chronic dual HF with EF , A fib with RVR:  Cardiology following.  Anticoagulation on hold in light of thrombocytopenia, rec start after patient has permanent access  7. HIV: on biktarvy   Pearson Grippe MD

## 2018-07-19 NOTE — Progress Notes (Addendum)
ANTICOAGULATION CONSULT NOTE - F/U Consult  Pharmacy Consult for Coumadin and Ceftaz Indication: atrial fibrillation + E. coli bacteremia.  No Known Allergies  Patient Measurements: Height: 5\' 3"  (160 cm) Weight: 162 lb 0.6 oz (73.5 kg) IBW/kg (Calculated) : 56.9  Vital Signs: Temp: 97.9 F (36.6 C) (02/20 0505) Temp Source: Oral (02/20 0505) BP: 136/74 (02/20 0505) Pulse Rate: 63 (02/20 0505)  Labs: Recent Labs    07/17/18 0416 07/18/18 0341 07/19/18 0500  HGB 11.8* 12.2*  --   HCT 37.3* 38.7*  --   PLT 103* 127*  --   LABPROT  --  14.3 14.8  INR  --  1.12 1.17  CREATININE 6.00* 3.74*  --     Estimated Creatinine Clearance: 17.5 mL/min (A) (by C-G formula based on SCr of 3.74 mg/dL (H)).   Medical History: Past Medical History:  Diagnosis Date  . AICD (automatic cardioverter/defibrillator) present   . Alcoholism in remission ([place])   . Anemia   . Aortic insufficiency   . Arthritis   . At moderate risk for fall   . Bell's palsy   . Chronic systolic heart failure (HCC)    NYHA Class III  . CKD (chronic kidney disease), stage IV ([place])   . COPD (chronic obstructive pulmonary disease) ([place])   . Essential hypertension, benign   . HIV disease ([place]) 09/06/2016  . Hyperlipidemia   . NICM (nonischemic cardiomyopathy) ([place])   . Noncompliance with medication regimen   . NSVT (nonsustained ventricular tachycardia) ([place])   . Numbness of right jaw    Had a stoke in 01/2013. Numbness is occasional, especially when trying to chew.  . OSA (obstructive sleep apnea)   . Productive cough 08/2013   With brown sputum   . S/P aortic valve replacement with bioprosthetic valve   . Stroke ([place]) 01/2013   weakness of right side from CVA  . Type 2 diabetes mellitus ([place])   . Uses hearing aid 2014   recently received new hearing aids     Assessment:  Anticoag:  None PTA. s/p IV heparin for new onset AFib with RVR, CHA2DS2-Vasc score 5 >>anticoag stopped due to thrombocytopenia.  Coumadin started 2/18. Hgb 12.1. Plts 127 rising. INR 1.17-dose not given 2/19 - Book, video 2/18 but too confused  ID: abx for E. coli bacteremia. Source unclear- s/p LP 2/11 only 3 WBCs, thus ruled out meningitis. TEE ok.  WBC wnl, remains afebrile Well controlled HIV per ID - Biktarvy resumed Stool C. difficile antigen and PCR positive, but negative for toxin  Cefepime 2/7 >> 2/9 Vancomycin x 1 on 2/7 Ceftriaxone 2/8 >>2/13 Ceftaz 2/13>>(2/23) Vancomycin 2/9 >>2/10 Acyclovir 2/9 >>2/10 Ampicillin 2/9 >> 2/10   2/10 BCx x2: negative 2/9 UCx: neg  2/8 MRSA PCR: negative 2/7 BCx: ecoli: pan sensitve except R to amp, unasyn 2/11 CSF: ngF 2/11 fungal cx: negative  Goal of Therapy:  INR 2-3 Monitor platelets by anticoagulation protocol: Yes   Plan:  - Coumadin 7.5mg  po x 1 tonight. Daily INR - Ceftaz 1 g IV q24h (stop date 2/23)  **Ceftaz 1 g IV q24h (stop date 2/23, ID note says 3/23 but 2 wks duration, 1st neg bcx 2/10- Lucianne Le[place] meant 2/23)**   Brittay Mogle S. Alford Highland, PharmD, BCPS Clinical Staff Pharmacist Eilene Ghazi Stillinger 07/19/2018,9:54 AM   [place]: Hold Coumadin until after HD catheter placement 2/21. Rumor Sun S. Alford Highland, PharmD, [place] Clinical Staff Pharmacist

## 2018-07-19 NOTE — Anesthesia Preprocedure Evaluation (Addendum)
Anesthesia Evaluation  Patient identified by MRN, date of birth, ID band Patient awake    Reviewed: Allergy & Precautions, NPO status , Patient's Chart, lab work & pertinent test results  Airway Mallampati: I  TM Distance: >3 FB Neck ROM: Full    Dental  (+) Edentulous Upper, Edentulous Lower, Dental Advidsory Given   Pulmonary sleep apnea , COPD, former smoker,     + decreased breath sounds      Cardiovascular hypertension, + CAD  + Cardiac Defibrillator  Rhythm:Regular Rate:Normal + Systolic murmurs s/p Aortic Valve Replacement   Neuro/Psych CVA    GI/Hepatic   Endo/Other  diabetes, Type 2  Renal/GU ESRF and DialysisRenal disease     Musculoskeletal  (+) Arthritis ,   Abdominal Normal abdominal exam  (+)   Peds  Hematology  (+) Blood dyscrasia, anemia ,   Anesthesia Other Findings - HLD  Reproductive/Obstetrics                           Lab Results  Component Value Date   WBC 6.9 07/18/2018   HGB 12.2 (L) 07/18/2018   HCT 38.7 (L) 07/18/2018   MCV 89.6 07/18/2018   PLT 127 (L) 07/18/2018   Lab Results  Component Value Date   CREATININE 4.61 (H) 07/19/2018   BUN 59 (H) 07/19/2018   NA 140 07/19/2018   K 3.3 (L) 07/19/2018   CL 106 07/19/2018   CO2 24 07/19/2018   Lab Results  Component Value Date   INR 1.17 07/19/2018   INR 1.12 07/18/2018   INR 1.23 12/09/2016   EKG: NSR  Echo: 1. The left ventricle has severely reduced systolic function, with an ejection fraction of 20-25%. The cavity size was mildly dilated. There is mildly increased left ventricular wall thickness. Left ventricular diastolic Doppler parameters are  consistent with impaired relaxation Left ventricular diffuse hypokinesis.  2. The right ventricle has normal systolic function. The cavity was normal. There is no increase in right ventricular wall thickness.  3. Left atrial size was mildly dilated.  4. The  mitral valve is normal in structure. There is mild thickening.  5. The tricuspid valve is normal in structure.  6. AVR.  7. The pulmonic valve was normal in structure.  8. Severe global reduction in LV systolic function; mild LVH; mild LVE; mild LAE; s/p AVR (not well visualized; mean gradient 12 mmHg; AVA 1.5 cm2 but may be underestimated); no vegetations but cannot R/O with this study; suggest TEE if clinically    Anesthesia Physical Anesthesia Plan  ASA: IV  Anesthesia Plan: MAC   Post-op Pain Management:    Induction: Intravenous  PONV Risk Score and Plan: 2 and Ondansetron and Propofol infusion  Airway Management Planned: Simple Face Mask and Natural Airway  Additional Equipment: None  Intra-op Plan:   Post-operative Plan:   Informed Consent: I have reviewed the patients History and Physical, chart, labs and discussed the procedure including the risks, benefits and alternatives for the proposed anesthesia with the patient or authorized representative who has indicated his/her understanding and acceptance.     Dental Advisory Given  Plan Discussed with: CRNA  Anesthesia Plan Comments:       Anesthesia Quick Evaluation

## 2018-07-19 NOTE — Clinical Social Work Note (Signed)
Clinical Social Work Assessment  Patient Details  Name: Keith Young MRN: [id number] Date of Birth: 1951-05-19  Date of referral:  07/19/18               Reason for consult:  Facility Placement                Permission sought to share information with:  Facility Sport and exercise psychologist, Family Supports Permission granted to share information::  Yes, Verbal Permission Granted  Name::     Jenny Reichmann  Agency::  [facility]  Relationship::  Brother and sister  Contact Information:  [phone]  Housing/Transportation Living arrangements for the past 2 months:  [place] of Information:  Siblings Patient Interpreter Needed:  None Criminal Activity/Legal Involvement Pertinent to Current Situation/Hospitalization:  No - Comment as needed Significant Relationships:  Siblings Lives with:  Facility Resident Do you feel safe going back to the place where you live?  Yes Need for family participation in patient care:  Yes (Comment)  Care giving concerns:  CSW received consult for possible SNF placement at time of discharge. CSW spoke with patient's brother regarding PT recommendation of SNF placement at time of discharge. Patient's brother reported that patient resides at [facility] and will return at discharge. CSW to continue to follow and assist with discharge planning needs.   Social Worker assessment / plan:  CSW spoke with patient's brother concerning possibility of return to rehab at [facility].  Employment status:  Retired Nurse, adult PT Recommendations:  [place] / Referral to community resources:  [place]  Patient/Family's Response to care:  Patient's brother reports agreement with discharge plan.   Patient/Family's Understanding of and Emotional Response to Diagnosis, Current Treatment, and Prognosis: Patient/family is realistic regarding therapy needs and expressed being hopeful for  return to SNF placement. Patient's son expressed understanding of CSW role and discharge process as well as medical condition. No questions/concerns about plan or treatment.    Emotional Assessment Appearance:  Appears stated age Attitude/Demeanor/Rapport:  Unable to Assess Affect (typically observed):  Unable to Assess Orientation:  Oriented to Self Alcohol / Substance use:  Not Applicable Psych involvement (Current and /or in the community):  No (Comment)  Discharge Needs  Concerns to be addressed:  Care Coordination Readmission within the last 30 days:  No Current discharge risk:  None Barriers to Discharge:  Continued Medical Work up   [employer], LCSW 07/19/2018, 1:02 PM

## 2018-07-19 NOTE — Progress Notes (Signed)
   Plan is for left arm AV fistula versus graft and conversion to tunneled dialysis catheter tomorrow in OR with Dr. Donnetta Hutching.  N.p.o. past midnight.  Zaylin Pistilli C. Donzetta Matters, MD [facility] [place] Office: [phone] Pager: [phone]

## 2018-07-19 NOTE — Progress Notes (Signed)
PROGRESS NOTE        PATIENT DETAILS Name: Keith Young Age: 68 y.o. Sex: male Date of Birth: [birth year] Admit Date: 07/06/2018 Admitting Physician Etta Quill, DO RUE:AVWUJ, Rene Kocher, MD  Brief Narrative: Patient is a 68 y.o. male with history of chronic systolic heart failure with AICD in place, bioprosthetic AVR, stage IV CKD, DM-2, COPD, HIV on antiretrovirals-presented with sepsis secondary to E. coli bacteremia and acute kidney injury.  See below for further details  Subjective: Awake-alert-slow but answers all my questions appropriately.  Assessment/Plan: Sepsis secondary to E. coli bacteremia: Sepsis pathophysiology has resolved-evaluated by infectious disease, recommendations are for 2-week duration of ceftazidime-end date of February 23.  CT abdomen negative for any foci for bacteremia.  Acute metabolic encephalopathy: Multifactorial-secondary to sepsis and also from uremia due to worsening renal function.  Encephalopathy has markedly improved-although slow-he is able to answer all my questions appropriately this morning. Underwent LP on 2/11-CSF not consistent with meningitis.  Acute kidney injury on stage IV CKD: AKI likely secondary to ATN in the setting of sepsis.  Did require intermittent hemodialysis during this hospital stay, renal function fluctuating-but has good urine output and stable electrolytes-tentatively scheduled for tunneled dialysis catheter and left arm AV fistula placement on 2/21-but if renal function is stable-with good urine output-nephrology considering deferring access placement.  Repeat labs tomorrow morning.  HIV: Continue antiretrovirals.  PAF: Rate controlled-continue metoprolol.  Previously on IV heparin but this was discontinued due to worsening, cytopenia, he was then started on Coumadin but due to tentative plans to place hemodialysis access on 2/21-Coumadin remains on hold.  We will plan to resume anticoagulation  once all vascular surgery procedures are complete  Thrombocytopenia: Probably secondary to sepsis-improving  Chronic systolic heart failure (EF 20-25% by TTE on 07/12/2018): Volume status relatively stable will need to be managed with HD going forward.  Bioprosthetic AVR: TTE on 2/13 without any obvious vegetation-repeat blood cultures on 2/10.  DM-2 with hypoglycemia: CBGs relatively stable-no hypoglycemic episodes overnight.  Continue SSI.  Hypertension: Controlled-continue metoprolol.  Dysphagia: Speech therapy following-on dysphagia 2 diet.  Has prior history of CVA-along with profound deconditioning-may be contributing to oropharyngeal dysphagia.  Deconditioning/debility: Secondary to acute illness-PT evaluation completed-SNF on discharge  DVT Prophylaxis: SCD's  Code Status: Full code   Family Communication: None at bedside  Disposition Plan: Remain inpatient-SNF on discharge when insurance authorization available  Antimicrobial agents: Anti-infectives (From admission, onward)   Start     Dose/Rate Route Frequency Ordered Stop   07/20/18 0600  cefUROXime (ZINACEF) 1.5 g in sodium chloride 0.9 % 100 mL IVPB     1.5 g 200 mL/hr over 30 Minutes Intravenous On call to O.R. 07/19/18 1249 07/21/18 0559   07/12/18 2200  cefTAZidime (FORTAZ) 1 g in sodium chloride 0.9 % 100 mL IVPB     1 g 200 mL/hr over 30 Minutes Intravenous Every 24 hours 07/12/18 1449 07/22/18 2359   07/10/18 1430  vancomycin (VANCOCIN) IVPB 1000 mg/200 mL premix  Status:  Discontinued     1,000 mg 200 mL/hr over 60 Minutes Intravenous Every 48 hours 07/08/18 1421 07/09/18 0959   07/10/18 1000  cefTRIAXone (ROCEPHIN) 2 g in sodium chloride 0.9 % 100 mL IVPB  Status:  Discontinued     2 g 200 mL/hr over 30 Minutes Intravenous Every 24 hours 07/10/18 0942  07/12/18 1433   07/10/18 0847  cefTRIAXone (ROCEPHIN) 2 g in sodium chloride 0.9 % 100 mL IVPB  Status:  Discontinued     2 g 200 mL/hr over 30 Minutes  Intravenous Every 24 hours 07/09/18 1004 07/10/18 0933   07/09/18 1400  acyclovir (ZOVIRAX) 745 mg in dextrose 5 % 150 mL IVPB  Status:  Discontinued     10 mg/kg  74.5 kg 164.9 mL/hr over 60 Minutes Intravenous Every 24 hours 07/08/18 1421 07/09/18 0959   07/09/18 1300  bictegravir-emtricitabine-tenofovir AF (BIKTARVY) 50-200-25 MG per tablet 1 tablet     1 tablet Oral Daily 07/09/18 1012     07/08/18 2300  cefTRIAXone (ROCEPHIN) 2 g in sodium chloride 0.9 % 100 mL IVPB  Status:  Discontinued     2 g 200 mL/hr over 30 Minutes Intravenous Every 12 hours 07/08/18 1341 07/09/18 1004   07/08/18 1800  vancomycin (VANCOCIN) IVPB 1000 mg/200 mL premix  Status:  Discontinued     1,000 mg 200 mL/hr over 60 Minutes Intravenous Every 48 hours 07/06/18 2019 07/07/18 1137   07/08/18 1430  ampicillin (OMNIPEN) 2 g in sodium chloride 0.9 % 100 mL IVPB  Status:  Discontinued     2 g 300 mL/hr over 20 Minutes Intravenous Every 12 hours 07/08/18 1343 07/09/18 0959   07/08/18 1430  vancomycin (VANCOCIN) 1,500 mg in sodium chloride 0.9 % 500 mL IVPB     1,500 mg 250 mL/hr over 120 Minutes Intravenous  Once 07/08/18 1421 07/08/18 1723   07/08/18 1400  acyclovir (ZOVIRAX) 745 mg in dextrose 5 % 150 mL IVPB     10 mg/kg  74.5 kg 164.9 mL/hr over 60 Minutes Intravenous  Once 07/08/18 1345 07/09/18 1315   07/07/18 1800  ceFEPIme (MAXIPIME) 1 g in sodium chloride 0.9 % 100 mL IVPB  Status:  Discontinued     1 g 200 mL/hr over 30 Minutes Intravenous Every 24 hours 07/06/18 2019 07/07/18 1137   07/07/18 1145  cefTRIAXone (ROCEPHIN) 2 g in sodium chloride 0.9 % 100 mL IVPB  Status:  Discontinued     2 g 200 mL/hr over 30 Minutes Intravenous Every 24 hours 07/07/18 1137 07/08/18 1341   07/07/18 1000  bictegravir-emtricitabine-tenofovir AF (BIKTARVY) 50-200-25 MG per tablet 1 tablet  Status:  Discontinued     1 tablet Oral Daily 07/07/18 0058 07/07/18 1127   07/06/18 1900  ceFEPIme (MAXIPIME) 2 g in sodium chloride  0.9 % 100 mL IVPB     2 g 200 mL/hr over 30 Minutes Intravenous  Once 07/06/18 1859 07/06/18 2001   07/06/18 1900  vancomycin (VANCOCIN) IVPB 1000 mg/200 mL premix     1,000 mg 200 mL/hr over 60 Minutes Intravenous  Once 07/06/18 1859 07/06/18 2035      Procedures: 2/11>>LP 2/10>> IR guided HD catheter placement  CONSULTS:  cardiology, nephrology, neurology and vascular surgery  ID  Time spent: 25-minutes-Greater than 50% of this time was spent in counseling, explanation of diagnosis, planning of further management, and coordination of care.  MEDICATIONS: Scheduled Meds: . bictegravir-emtricitabine-tenofovir AF  1 tablet Oral Daily  . Chlorhexidine Gluconate Cloth  6 each Topical Q0600  . insulin aspart  0-9 Units Subcutaneous TID WC  . mouth rinse  15 mL Mouth Rinse BID  . metoprolol succinate  25 mg Oral Daily  . mometasone-formoterol  2 puff Inhalation BID  . predniSONE  40 mg Oral Q breakfast  . warfarin   Does not apply Once  Continuous Infusions: . sodium chloride Stopped (07/15/18 0106)  . cefTAZidime (FORTAZ)  IV 1 g (07/18/18 2220)  . [START ON 07/20/2018] cefUROXime (ZINACEF)  IV     PRN Meds:.sodium chloride, acetaminophen **OR** acetaminophen, albuterol, metoprolol tartrate, ondansetron **OR** ondansetron (ZOFRAN) IV, RESOURCE THICKENUP CLEAR, sodium chloride flush   PHYSICAL EXAM: Vital signs: Vitals:   07/18/18 2042 07/18/18 2135 07/19/18 0505 07/19/18 0840  BP: 138/89  136/74   Pulse: 73  63   Resp: 17  17   Temp: 98.8 F (37.1 C)  97.9 F (36.6 C)   TempSrc: Oral  Oral   SpO2: 99% 97% 99% 95%  Weight:      Height:       Filed Weights   07/14/18 0528 07/15/18 0656 07/17/18 1231  Weight: 72.9 kg 73.6 kg 73.5 kg   Body mass index is 28.7 kg/m.   General appearance:Awake, alert, not in any distress.  Chronically frail appearing. Eyes:no scleral icterus. HEENT: Atraumatic and Normocephalic Neck: supple Resp:Good air entry bilaterally,no  rales or rhonchi CVS: S1 S2 regular GI: Bowel sounds present, Non tender and not distended with no gaurding, rigidity or rebound. Extremities: B/L Lower Ext shows no edema, both legs are warm to touch Neurology:  Non focal-but with generalized weakness   I have personally reviewed following labs and imaging studies  LABORATORY DATA: CBC: Recent Labs  Lab 07/14/18 0531 07/15/18 0530 07/16/18 0430 07/17/18 0416 07/18/18 0341  WBC 4.3 4.3 2.8* 3.1* 6.9  NEUTROABS 3.2 3.0 2.0 2.1 5.7  HGB 12.7* 12.4* 11.3* 11.8* 12.2*  HCT 41.3 38.4* 35.4* 37.3* 38.7*  MCV 90.2 90.4 90.1 89.9 89.6  PLT 68* 82* 89* 103* 127*    Basic Metabolic Panel: Recent Labs  Lab 07/13/18 0337 07/14/18 0531 07/15/18 0530 07/16/18 0430 07/17/18 0416 07/18/18 0341 07/19/18 1148  NA 143 138 138 139 139 140 140  K 4.7 4.4 3.9 3.7 3.5 3.7 3.3*  CL 109 104 104 104 103 105 106  CO2 24 23 27 22 23 25 24   GLUCOSE 161* 157* 115* 135* 114* 93 113*  BUN 72* 53* 77* 87* 95* 48* 59*  CREATININE 5.74* 4.43* 5.55* 5.94* 6.00* 3.74* 4.61*  CALCIUM 7.4* 7.1* 6.7* 6.4* 6.4* 7.0* 6.8*  MG 2.1 2.1  --  2.2 2.1 1.9  --     GFR: Estimated Creatinine Clearance: 14.2 mL/min (A) (by C-G formula based on SCr of 4.61 mg/dL (H)).  Liver Function Tests: Recent Labs  Lab 07/16/18 0430  AST 31  ALT 36  ALKPHOS 62  BILITOT 0.5  PROT 5.5*  ALBUMIN 2.0*   No results for input(s): LIPASE, AMYLASE in the last 168 hours. No results for input(s): AMMONIA in the last 168 hours.  Coagulation Profile: Recent Labs  Lab 07/18/18 0341 07/19/18 0500  INR 1.12 1.17    Cardiac Enzymes: No results for input(s): CKTOTAL, CKMB, CKMBINDEX, TROPONINI in the last 168 hours.  BNP (last 3 results) No results for input(s): PROBNP in the last 8760 hours.  HbA1C: No results for input(s): HGBA1C in the last 72 hours.  CBG: Recent Labs  Lab 07/18/18 1201 07/18/18 1647 07/18/18 2039 07/19/18 0828 07/19/18 1237  GLUCAP 137*  256* 318* 97 163*    Lipid Profile: No results for input(s): CHOL, HDL, LDLCALC, TRIG, CHOLHDL, LDLDIRECT in the last 72 hours.  Thyroid Function Tests: No results for input(s): TSH, T4TOTAL, FREET4, T3FREE, THYROIDAB in the last 72 hours.  Anemia Panel: No results for input(s): VITAMINB12,  FOLATE, FERRITIN, TIBC, IRON, RETICCTPCT in the last 72 hours.  Urine analysis:    Component Value Date/Time   COLORURINE AMBER (A) 07/08/2018 0839   APPEARANCEUR CLOUDY (A) 07/08/2018 0839   LABSPEC 1.016 07/08/2018 0839   PHURINE 5.0 07/08/2018 0839   GLUCOSEU NEGATIVE 07/08/2018 0839   HGBUR SMALL (A) 07/08/2018 0839   BILIRUBINUR NEGATIVE 07/08/2018 0839   KETONESUR 5 (A) 07/08/2018 0839   PROTEINUR 100 (A) 07/08/2018 0839   UROBILINOGEN 0.2 10/23/2013 1545   NITRITE NEGATIVE 07/08/2018 0839   LEUKOCYTESUR LARGE (A) 07/08/2018 0839    Sepsis Labs: Lactic Acid, Venous    Component Value Date/Time   LATICACIDVEN 1.5 07/08/2018 1952    MICROBIOLOGY: Recent Results (from the past 240 hour(s))  CSF culture     Status: None   Collection Time: 07/10/18  2:44 PM  Result Value Ref Range Status   Specimen Description CSF  Final   Special Requests NONE  Final   Gram Stain   Final    WBC PRESENT,BOTH PMN AND MONONUCLEAR NO ORGANISMS SEEN CYTOSPIN SMEAR    Culture   Final    NO GROWTH 3 DAYS Performed at [facility], [address] [place], [place] [address]    Report Status 07/13/2018 FINAL  Final  Fungus Culture With Stain     Status: None (Preliminary result)   Collection Time: 07/10/18  2:44 PM  Result Value Ref Range Status   Fungus Stain Final report  Final    Comment: (NOTE) Performed At: [facility] [place], [place] [id number] Rush Farmer MD [id number]    Fungus (Mycology) Culture PENDING  Incomplete   Fungal Source CSF  Final  Fungus Culture Result     Status: None   Collection Time: 07/10/18  2:44 PM  Result Value Ref Range  Status   Result 1 Comment  Final    Comment: (NOTE) KOH/Calcofluor preparation:  no fungus observed. Performed At: [facility] [place], [place] [id number] Rush Farmer MD WN:[id number]     RADIOLOGY STUDIES/RESULTS: Ct Abdomen Pelvis Wo Contrast  Result Date: 07/12/2018 CLINICAL DATA:  Bacteremia EXAM: CT ABDOMEN AND PELVIS WITHOUT CONTRAST TECHNIQUE: Multidetector CT imaging of the abdomen and pelvis was performed following the standard protocol without IV contrast. COMPARISON:  None. FINDINGS: Lower chest: Lung bases demonstrate mild atelectatic changes without sizable effusion. Coronary calcifications are seen. Defibrillator is again noted. Hepatobiliary: No focal liver abnormality is seen. No gallstones, gallbladder wall thickening, or biliary dilatation. Pancreas: Unremarkable. No pancreatic ductal dilatation or surrounding inflammatory changes. Spleen: Normal in size without focal abnormality. Adrenals/Urinary Tract: Adrenal glands are within normal limits. Kidneys are well visualized bilaterally. Right renal cyst is seen. A smaller left renal cyst is noted as well no renal calculi or obstructive changes are seen. The ureters are within normal limits bilaterally. The bladder is partially distended. Stomach/Bowel: Diverticular changes noted within the colon without evidence of diverticulitis. The appendix is within normal limits. No small bowel inflammatory changes are seen. Stomach is decompressed. Vascular/Lymphatic: Aortic atherosclerosis. No enlarged abdominal or pelvic lymph nodes. Reproductive: Prostate is unremarkable. Other: No abdominal wall hernia or abnormality. No abdominopelvic ascites. Musculoskeletal: Degenerative changes of the lumbar spine are seen. IMPRESSION: Diverticulosis without diverticulitis. Bilateral renal cysts. Mild bibasilar atelectasis. Electronically Signed   By: Inez Catalina M.D.   On: 07/12/2018 19:09   Dg Chest 1 View  Result Date:  07/07/2018 CLINICAL DATA:  68 year old male with history of tachycardia.  EXAM: CHEST  1 VIEW COMPARISON:  Chest x-ray 07/06/2018. FINDINGS: Status post median sternotomy. External defibrillator pad projecting over the lower left hemithorax. Left-sided pacemaker/AICD with lead tip projecting over the expected location of the right ventricular apex. Lung volumes are slightly low. No acute consolidative airspace disease. Small left pleural effusion. No right pleural effusion. There is cephalization of the pulmonary vasculature and slight indistinctness of the interstitial markings suggestive of mild pulmonary edema. Mild cardiomegaly. The patient is rotated to the left on today's exam, resulting in distortion of the mediastinal contours and reduced diagnostic sensitivity and specificity for mediastinal pathology. Aortic atherosclerosis. IMPRESSION: 1. Mild interstitial pulmonary edema and small left pleural effusion in the setting of mild cardiomegaly; imaging findings concerning for congestive heart failure. 2. Aortic atherosclerosis. 3. Postoperative changes and support apparatus, as above. Electronically Signed   By: Vinnie Langton M.D.   On: 07/07/2018 11:54   Ct Head Wo Contrast  Result Date: 07/11/2018 CLINICAL DATA:  Initial evaluation for acute altered mental status, asymmetric left-sided weakness with left facial droop. EXAM: CT HEAD WITHOUT CONTRAST TECHNIQUE: Contiguous axial images were obtained from the base of the skull through the vertex without intravenous contrast. COMPARISON:  Prior CT from 07/06/2018. FINDINGS: Brain: Age advanced cerebral atrophy with chronic microvascular ischemic disease, stable. Remote right MCA and/or MCA/PCA watershed territory infarct involving the right temporal occipital region noted, stable. Multiple chronic lacunar infarcts seen involving the bilateral basal ganglia, thalami, pons, and cerebellum. Specifically, previously noted right basal ganglia lacunar infarcts are  stable from previous exam, likely chronic. No acute intracranial hemorrhage. No new or evolving acute large vessel territory infarct. No mass lesion, midline shift or mass effect. No hydrocephalus. No extra-axial fluid collection. Vascular: No hyperdense vessel. Calcified atherosclerosis at the skull base. Skull: Scalp soft tissues and calvarium within normal limits. Sinuses/Orbits: Globes and orbital soft tissues within normal limits. Scattered mucosal thickening with superimposed air-fluid levels noted within the paranasal sinuses, worsened from previous. Mastoid air cells are clear. Other: None. IMPRESSION: 1. Stable head CT, with no new acute intracranial abnormality. 2. In band cerebral atrophy with chronic small vessel ischemic disease with multiple chronic ischemic infarcts as above. 3. Acute pan sinusitis, worsened from previous. Electronically Signed   By: Jeannine Boga M.D.   On: 07/11/2018 17:20   Ct Head Wo Contrast  Result Date: 07/06/2018 CLINICAL DATA:  Altered mental status. History of HIV, hypertension, hypercholesterolemia and diabetes. EXAM: CT HEAD WITHOUT CONTRAST TECHNIQUE: Contiguous axial images were obtained from the base of the skull through the vertex without intravenous contrast. COMPARISON:  CT HEAD July 21, 2016 FINDINGS: BRAIN: No intraparenchymal hemorrhage, mass effect, midline shift or acute large vascular territory infarcts. Old small RIGHT cerebellar and pontine infarcts. RIGHT temporal occipital encephalomalacia. New subcentimeter RIGHT internal capsule versus basal ganglia hypodensities. Old RIGHT basal ganglia infarct with mild ex vacuo dilatation RIGHT lateral ventricle. Old small bilateral thalami and LEFT basal ganglia infarcts. Confluent supratentorial white matter hypodensities. Moderate parenchymal brain volume loss. No hydrocephalus. VASCULAR: Moderate calcific atherosclerosis of the carotid siphons. SKULL: No skull fracture. No significant scalp soft  tissue swelling. SINUSES/ORBITS: Mild paranasal sinus mucosal thickening. Chronic dehiscence of the RIGHT posterolateral maxillary wall. Mastoid air cells are well aerated.The included ocular globes and orbital contents are non-suspicious. OTHER: Bilateral parotid sialoliths. IMPRESSION: 1. New age indeterminate RIGHT basal ganglia small infarcts. 2. Old RIGHT temporal occipital/PCA territory infarct. 3. Old basal ganglia, thalami, pontine and cerebellar small infarcts. Moderate to severe chronic  small vessel ischemic changes. 4. Moderate parenchymal brain volume loss, advanced for age. Electronically Signed   By: Elon Alas M.D.   On: 07/06/2018 22:16   US Renal  Result Date: 07/07/2018 CLINICAL DATA:  Acute kidney injury, history cardiomyopathy, chronic systolic heart failure, stage III chronic kidney disease, hypertension, COPD, former smoker EXAM: RENAL / URINARY TRACT ULTRASOUND COMPLETE COMPARISON:  09/05/2016 FINDINGS: Right Kidney: Renal measurements: 10.9.7 x 6.1 cm = volume: 200 mL. Cortical thinning. Increased cortical echogenicity. Tiny mid renal cyst 8 x 6.9 mm. Additional peripelvic cyst 3.0 x 3.2 x 3.1 cm, simple features. No additional mass or hydronephrosis. No shadowing calculi. Left Kidney: Renal measurements: 12.3 x 6.6 x 6.6 cm = volume: 276 mL. Cortical thinning. Increased cortical echogenicity. Small cyst at inferior pole 1.0 x 0.9 x 1.2 cm. Additional small cyst at inferior pole 2.0 x 1.6 x 1.9 cm, simple features. No additional mass, hydronephrosis, or shadowing calcification. Bladder: Decompressed, unable to evaluate. IMPRESSION: Medical renal disease changes of both kidneys. Small renal cysts in both kidneys. No evidence of hydronephrosis. Electronically Signed   By: Lavonia Dana M.D.   On: 07/07/2018 19:30   Ir Fluoro Guide Cv Line Right  Result Date: 07/09/2018 INDICATION: Acute uremia, acute kidney injury, no current access for dialysis EXAM: Ultrasound guidance for  vascular access Right IJ temporary dialysis catheter (Mahurkar catheter) MEDICATIONS: 1% lidocaine local ANESTHESIA/SEDATION: Moderate Sedation Time: None. The patient's level of consciousness and vital signs were monitored continuously by radiology nursing throughout the procedure under my direct supervision. FLUOROSCOPY TIME:  Fluoroscopy Time: 0 minutes 24 seconds (2 mGy). COMPLICATIONS: None immediate. PROCEDURE: Emergency consent was obtained from the patient's physician after a thorough discussion of the procedural risks, benefits and alternatives. All questions were addressed. Maximal Sterile Barrier Technique was utilized including caps, mask, sterile gowns, sterile gloves, sterile drape, hand hygiene and skin antiseptic. A timeout was performed prior to the initiation of the procedure. Under sterile conditions and local anesthesia, ultrasound micropuncture access performed of the right internal jugular vein. Images obtained for documentation of the patent jugular vein. Guidewire advanced easily. Measurements obtained. Four [place] dilator advanced. Amplatz guidewire advanced into the IVC. Tract dilatation performed to insert a temporary dialysis catheter. Tip position at the SVC RA junction. Images obtained for documentation. Catheter secured Prolene sutures. Blood aspirated easily followed by saline and heparin flushes. Appropriate volume and strength of heparin instilled in all lumens. External caps applied. Catheter secured with Prolene sutures and a sterile dressing. No immediate complication. Patient tolerated the procedure well. IMPRESSION: Successful ultrasound and fluoroscopic right IJ temporary dialysis catheter. Tip SVC RA junction. Ready for use. Electronically Signed   By: Jerilynn Mages.  Shick M.D.   On: 07/09/2018 13:06   Ir US Guide Vasc Access Right  Result Date: 07/09/2018 INDICATION: Acute uremia, acute kidney injury, no current access for dialysis EXAM: Ultrasound guidance for vascular access  Right IJ temporary dialysis catheter (Mahurkar catheter) MEDICATIONS: 1% lidocaine local ANESTHESIA/SEDATION: Moderate Sedation Time: None. The patient's level of consciousness and vital signs were monitored continuously by radiology nursing throughout the procedure under my direct supervision. FLUOROSCOPY TIME:  Fluoroscopy Time: 0 minutes 24 seconds (2 mGy). COMPLICATIONS: None immediate. PROCEDURE: Emergency consent was obtained from the patient's physician after a thorough discussion of the procedural risks, benefits and alternatives. All questions were addressed. Maximal Sterile Barrier Technique was utilized including caps, mask, sterile gowns, sterile gloves, sterile drape, hand hygiene and skin antiseptic. A timeout was performed prior to  the initiation of the procedure. Under sterile conditions and local anesthesia, ultrasound micropuncture access performed of the right internal jugular vein. Images obtained for documentation of the patent jugular vein. Guidewire advanced easily. Measurements obtained. Four [place] dilator advanced. Amplatz guidewire advanced into the IVC. Tract dilatation performed to insert a temporary dialysis catheter. Tip position at the SVC RA junction. Images obtained for documentation. Catheter secured Prolene sutures. Blood aspirated easily followed by saline and heparin flushes. Appropriate volume and strength of heparin instilled in all lumens. External caps applied. Catheter secured with Prolene sutures and a sterile dressing. No immediate complication. Patient tolerated the procedure well. IMPRESSION: Successful ultrasound and fluoroscopic right IJ temporary dialysis catheter. Tip SVC RA junction. Ready for use. Electronically Signed   By: Jerilynn Mages.  Shick M.D.   On: 07/09/2018 13:06   Dg Chest Port 1 View  Result Date: 07/12/2018 CLINICAL DATA:  Wheezing EXAM: PORTABLE CHEST 1 VIEW COMPARISON:  07/11/2018 FINDINGS: Right IJ approach hemodialysis catheter tip is in the right  atrium. Unchanged position of left chest wall single lead AICD. Mild interstitial pulmonary edema. Mild cardiomegaly with findings of prior CABG. Trace left pleural effusion. IMPRESSION: Mild cardiomegaly and mild interstitial pulmonary edema. Electronically Signed   By: Ulyses Jarred M.D.   On: 07/12/2018 04:32   Dg Chest Port 1 View  Result Date: 07/11/2018 CLINICAL DATA:  Shortness of breath EXAM: PORTABLE CHEST 1 VIEW COMPARISON:  07/07/2018 FINDINGS: Left chest wall AICD lead is in unchanged position. There is mild cardiomegaly. Central pulmonary vascular congestion without overt pulmonary edema. No focal airspace consolidation. Small left pleural effusion. No pneumothorax. IMPRESSION: Cardiomegaly and small left pleural effusion without overt pulmonary edema. Electronically Signed   By: Ulyses Jarred M.D.   On: 07/11/2018 03:35   Dg Chest Port 1 View  Result Date: 07/06/2018 CLINICAL DATA:  Sepsis EXAM: PORTABLE CHEST 1 VIEW COMPARISON:  Portable exam 2023 hours compared to 12/09/2016 FINDINGS: LEFT subclavian AICD with lead projecting over RIGHT ventricle. External pacing lead present. Enlargement of cardiac silhouette with postsurgical changes of CABG. Atherosclerotic calcification aorta. Mediastinal contours normal. Subsegmental atelectasis LEFT lower lobe. Pulmonary vascular congestion with interstitial prominence at the mid to lower lungs which could represent minimal pulmonary edema. No pleural effusion or pneumothorax. Bones demineralized. IMPRESSION: Enlargement of cardiac silhouette with vascular congestion and question minimal pulmonary edema. Electronically Signed   By: Lavonia Dana M.D.   On: 07/06/2018 20:35   Dg Swallowing Func-speech Pathology  Result Date: 07/14/2018 Objective Swallowing Evaluation: Type of Study: MBS-Modified Barium Swallow Study  Patient Details Name: Keith Young MRN: [id number] Date of Birth: March 02, 1951 Today's Date: 07/14/2018 Time: SLP Start Time (ACUTE ONLY):  55 -SLP Stop Time (ACUTE ONLY): 1150 SLP Time Calculation (min) (ACUTE ONLY): 20 min Past Medical History: Past Medical History: Diagnosis Date . AICD (automatic cardioverter/defibrillator) present  . Alcoholism in remission ([place])  . Anemia  . Aortic insufficiency  . Arthritis  . At moderate risk for fall  . Bell's palsy  . Chronic systolic heart failure (HCC)   NYHA Class III . CKD (chronic kidney disease), stage IV ([place])  . COPD (chronic obstructive pulmonary disease) ([place])  . Essential hypertension, benign  . HIV disease ([place]) 09/06/2016 . Hyperlipidemia  . NICM (nonischemic cardiomyopathy) ([place])  . Noncompliance with medication regimen  . NSVT (nonsustained ventricular tachycardia) ([place])  . Numbness of right jaw   Had a stoke in 01/2013. Numbness is occasional, especially when trying to chew. . OSA (obstructive  sleep apnea)  . Productive cough 08/2013  With brown sputum  . S/P aortic valve replacement with bioprosthetic valve  . Stroke ([place]) 01/2013  weakness of right side from CVA . Type 2 diabetes mellitus ([place])  . Uses hearing aid 2014  recently received new hearing aids Past Surgical History: Past Surgical History: Procedure Laterality Date . BIOPSY N/A 04/10/2014  Procedure: GASTRIC BIOPSY;  Surgeon: Daneil Dolin, MD;  Location: [facility];  Service: Endoscopy;  Laterality: N/A; . BIOPSY  10/12/2015  Procedure: BIOPSY;  Surgeon: Daneil Dolin, MD;  Location: [facility];  Service: Endoscopy;;  stomach bx's . CARDIAC DEFIBRILLATOR PLACEMENT  11/12/12  [employer] Inogen MINI ICD implanted in [place] at [facility] per Dr Nelly Laurence note . COLONOSCOPY WITH PROPOFOL N/A 04/10/2014  RMR: Colonic Diverticulosis . ESOPHAGOGASTRODUODENOSCOPY (EGD) WITH PROPOFOL N/A 04/10/2014  RMR: Mild erosive reflux esophagitis. Multiple antral polyps likely hyperplastic status post removal by hot snare cautery technique. Diffusely abnormal stomach status post gastric biopsy I  suspect some of patients anemaia may be due to intermittent oozing from the stomach. It would be difficult and a risky proposition to attempt complete removal of all of his gastric polyps. . ESOPHAGOGASTRODUODENOSCOPY (EGD) WITH PROPOFOL N/A 10/12/2015  Procedure: ESOPHAGOGASTRODUODENOSCOPY (EGD) WITH PROPOFOL;  Surgeon: Daneil Dolin, MD;  Location: [facility];  Service: Endoscopy;  Laterality: N/A;  1610 - moved to 9:15 . IR FLUORO GUIDE CV LINE RIGHT  07/09/2018 . IR US GUIDE VASC ACCESS RIGHT  07/09/2018 . POLYPECTOMY N/A 04/10/2014  Procedure: GASTRIC POLYPECTOMY;  Surgeon: Daneil Dolin, MD;  Location: [facility];  Service: Endoscopy;  Laterality: N/A; . RIGHT HEART CATHETERIZATION N/A 02/06/2014  Procedure: RIGHT HEART CATH;  Surgeon: Larey Dresser, MD;  Location: [facility] CATH LAB;  Service: Cardiovascular;  Laterality: N/A; HPI: Keith Young is a 68 y.o. male with PMH significant of HIV, type 2 diabetes, COPD, CVA (96045, HTN, Bells Palsy, systolic CHF, CKD, and history of AICD placement followed by cardiology.  Pt was admitted to [facility] ED 07/07/18 and after presenting with AMS at SNF residence, vomitted, and became increasingly unresponsive. Per PA note, pt was found to be encephalopathic secondary to sepsis and renal failure. Pt seen by ST in Jan 2018; BSE recc D2/thin. CXR showed cardiomegaly and small left pleural effusion. Head CT revealed new age indeterminate RIGHT basal ganglia small infarcts, old RIGHT temporal occipital/PCA territory infarct, old basal ganglia, thalami, pontine and cerebellar small infarcts.  Subjective: alert, cooperative Assessment / Plan / Recommendation CHL IP CLINICAL IMPRESSIONS 07/14/2018 Clinical Impression Pt presents with primary moderate oral dysphagia, mild pharyngeal dysphagia. There is lingual pumping, decreased bolus cohesion, piecemeal deglutition and premature spillage (to pyriforms with liquids, valleculae with solids). Premature spillage results in intermittent deep  larygneal penetration of thin, nectar, and honey thick liquids, with aspiration (sensed and silent) of thin and nectar. Due to impulsivity pt at risk for aspiration with liquids of any consistency. With solids, pt without rotary chew, lingual thrusting noted with poor cohesion and spillage of solids to the valleculae. SLP used straw to facilitate chin tuck, which prevented aspiration with thin liquids, including small and large sips and consecutive boluses. There was trace shallow penetration with chin tuck which reversed with swallow. Brief stasis of barium tablet in mid-thoracic esophagus, cleared pt's GE junction with liquid wash. Recommend dys 2, thin liquids, meds whole in puree; pt must tuck chin when swallowing for  airway protection. Recommend full supervision due to level of cuing necessary. SLP will follow for tolerance and training in swallow precautions/compensations.  SLP Visit Diagnosis Dysphagia, oral phase (R13.11);Dysphagia, pharyngeal phase (R13.13) Attention and concentration deficit following -- Frontal lobe and executive function deficit following -- Impact on safety and function Moderate aspiration risk   CHL IP TREATMENT RECOMMENDATION 07/14/2018 Treatment Recommendations Therapy as outlined in treatment plan below   Prognosis 07/14/2018 Prognosis for Safe Diet Advancement Good Barriers to Reach Goals Cognitive deficits Barriers/Prognosis Comment -- CHL IP DIET RECOMMENDATION 07/14/2018 SLP Diet Recommendations Dysphagia 2 (Fine chop) solids;Thin liquid Liquid Administration via Straw Medication Administration Whole meds with puree Compensations Slow rate;Small sips/bites;Chin tuck;Clear throat intermittently;Use straw to facilitate chin tuck Postural Changes --   CHL IP OTHER RECOMMENDATIONS 07/14/2018 Recommended Consults -- Oral Care Recommendations Oral care BID Other Recommendations --   CHL IP FOLLOW UP RECOMMENDATIONS 07/14/2018 Follow up Recommendations Skilled Nursing facility   [facility] IP  FREQUENCY AND DURATION 07/14/2018 Speech Therapy Frequency (ACUTE ONLY) min 2x/week Treatment Duration 2 weeks      CHL IP ORAL PHASE 07/14/2018 Oral Phase Impaired Oral - Pudding Teaspoon -- Oral - Pudding Cup -- Oral - Honey Teaspoon NT Oral - Honey Cup Weak lingual manipulation;Lingual pumping;Reduced posterior propulsion;Piecemeal swallowing;Decreased bolus cohesion;Premature spillage Oral - Nectar Teaspoon Weak lingual manipulation;Lingual pumping;Reduced posterior propulsion;Piecemeal swallowing;Decreased bolus cohesion;Premature spillage Oral - Nectar Cup Weak lingual manipulation;Lingual pumping;Reduced posterior propulsion;Piecemeal swallowing;Decreased bolus cohesion;Premature spillage Oral - Nectar Straw Weak lingual manipulation;Lingual pumping;Reduced posterior propulsion;Piecemeal swallowing;Delayed oral transit;Decreased bolus cohesion Oral - Thin Teaspoon Lingual pumping;Reduced posterior propulsion;Piecemeal swallowing;Decreased bolus cohesion;Premature spillage Oral - Thin Cup Weak lingual manipulation;Lingual pumping;Reduced posterior propulsion;Piecemeal swallowing;Decreased bolus cohesion;Premature spillage Oral - Thin Straw Weak lingual manipulation;Reduced posterior propulsion;Piecemeal swallowing;Decreased bolus cohesion;Premature spillage Oral - Puree Weak lingual manipulation;Lingual pumping;Reduced posterior propulsion;Piecemeal swallowing;Decreased bolus cohesion;Premature spillage Oral - Mech Soft Impaired mastication;Weak lingual manipulation;Lingual pumping;Reduced posterior propulsion;Lingual/palatal residue;Piecemeal swallowing;Delayed oral transit;Premature spillage;Decreased bolus cohesion Oral - Regular -- Oral - Multi-Consistency -- Oral - Pill Other (Comment) Oral Phase - Comment --  CHL IP PHARYNGEAL PHASE 07/14/2018 Pharyngeal Phase Impaired Pharyngeal- Pudding Teaspoon -- Pharyngeal -- Pharyngeal- Pudding Cup -- Pharyngeal -- Pharyngeal- Honey Teaspoon -- Pharyngeal --  Pharyngeal- Honey Cup Penetration/Aspiration before swallow;Penetration/Aspiration during swallow Pharyngeal Material enters airway, remains ABOVE vocal cords then ejected out;Material enters airway, remains ABOVE vocal cords and not ejected out Pharyngeal- Nectar Teaspoon -- Pharyngeal -- Pharyngeal- Nectar Cup Penetration/Aspiration before swallow;Penetration/Aspiration during swallow;Trace aspiration Pharyngeal Material does not enter airway;Material enters airway, CONTACTS cords and not ejected out;Material enters airway, passes BELOW cords without attempt by patient to eject out (silent aspiration) Pharyngeal- Nectar Straw Penetration/Aspiration before swallow;Penetration/Aspiration during swallow;Trace aspiration Pharyngeal Material enters airway, CONTACTS cords and not ejected out;Material enters airway, passes BELOW cords without attempt by patient to eject out (silent aspiration) Pharyngeal- Thin Teaspoon Penetration/Aspiration before swallow;Penetration/Aspiration during swallow;Moderate aspiration Pharyngeal Material enters airway, passes BELOW cords and not ejected out despite cough attempt by patient Pharyngeal- Thin Cup Penetration/Aspiration before swallow;Penetration/Aspiration during swallow;Moderate aspiration Pharyngeal Material does not enter airway;Material enters airway, CONTACTS cords and not ejected out;Material enters airway, passes BELOW cords and not ejected out despite cough attempt by patient;Material enters airway, passes BELOW cords without attempt by patient to eject out (silent aspiration) Pharyngeal- Thin Straw Penetration/Aspiration during swallow;Other (Comment) Pharyngeal Material does not enter airway;Material enters airway, remains ABOVE vocal cords then ejected out Pharyngeal- Puree WFL Pharyngeal -- Pharyngeal- Mechanical Soft WFL Pharyngeal -- Pharyngeal- Regular -- Pharyngeal --  Pharyngeal- Multi-consistency -- Pharyngeal -- Pharyngeal- Pill Other (Comment) Pharyngeal --  Pharyngeal Comment --  CHL IP CERVICAL ESOPHAGEAL PHASE 07/14/2018 Cervical Esophageal Phase WFL Pudding Teaspoon -- Pudding Cup -- Honey Teaspoon -- Honey Cup -- Nectar Teaspoon -- Nectar Cup -- Nectar Straw -- Thin Teaspoon -- Thin Cup -- Thin Straw -- Puree -- Mechanical Soft -- Regular -- Multi-consistency -- Pill -- Cervical Esophageal Comment -- Deneise Lever, MS, CCC-SLP Speech-Language Pathologist Acute Rehabilitation Services Pager: [phone] Office: [phone] Aliene Altes 07/14/2018, 1:46 PM              Vas [place] Upper Ext Vein Mapping (pre-op Avf)  Result Date: 07/18/2018 UPPER EXTREMITY VEIN MAPPING  Indications: Pre-access. Performing Technologist: Abram Sander RVS  Examination Guidelines: A complete evaluation includes B-mode imaging, spectral Doppler, color Doppler, and power Doppler as needed of all accessible portions of each vessel. Bilateral testing is considered an integral part of a complete examination. Limited examinations for reoccurring indications may be performed as noted. +-----------------+-------------+----------+--------------+ Right Cephalic   Diameter (cm)Depth (cm)   Findings    +-----------------+-------------+----------+--------------+ Shoulder             0.50        1.23                  +-----------------+-------------+----------+--------------+ Prox upper arm       0.55        0.84                  +-----------------+-------------+----------+--------------+ Mid upper arm        0.53        0.27                  +-----------------+-------------+----------+--------------+ Dist upper arm       0.41        0.41                  +-----------------+-------------+----------+--------------+ Antecubital fossa    0.50        0.29                  +-----------------+-------------+----------+--------------+ Prox forearm         0.24        0.22     branching    +-----------------+-------------+----------+--------------+ Mid forearm                              not visualized +-----------------+-------------+----------+--------------+ Dist forearm                            not visualized +-----------------+-------------+----------+--------------+ +-----------------+-------------+----------+--------+ Right Basilic    Diameter (cm)Depth (cm)Findings +-----------------+-------------+----------+--------+ Prox upper arm       0.36        1.36            +-----------------+-------------+----------+--------+ Mid upper arm        0.44        1.66            +-----------------+-------------+----------+--------+ Dist upper arm       0.56        0.95            +-----------------+-------------+----------+--------+ Antecubital fossa    0.64        0.50            +-----------------+-------------+----------+--------+ Prox forearm  0.29        0.21            +-----------------+-------------+----------+--------+ Mid forearm          0.32        0.20            +-----------------+-------------+----------+--------+ Distal forearm       0.30        0.20            +-----------------+-------------+----------+--------+ +-----------------+-------------+----------+---------+ Left Cephalic    Diameter (cm)Depth (cm)Findings  +-----------------+-------------+----------+---------+ Shoulder             0.40        0.98             +-----------------+-------------+----------+---------+ Prox upper arm       0.43        0.62             +-----------------+-------------+----------+---------+ Mid upper arm        0.41        0.36             +-----------------+-------------+----------+---------+ Dist upper arm       0.39        0.37             +-----------------+-------------+----------+---------+ Antecubital fossa    0.50        0.34   branching +-----------------+-------------+----------+---------+ Prox forearm         0.31        0.18              +-----------------+-------------+----------+---------+ Mid forearm          0.29        0.19             +-----------------+-------------+----------+---------+ Dist forearm         0.29        0.17             +-----------------+-------------+----------+---------+ +-----------------+-------------+----------+--------------+ Left Basilic     Diameter (cm)Depth (cm)   Findings    +-----------------+-------------+----------+--------------+ Prox upper arm       0.70        1.48                  +-----------------+-------------+----------+--------------+ Mid upper arm        0.57        1.30     branching    +-----------------+-------------+----------+--------------+ Dist upper arm                          not visualized +-----------------+-------------+----------+--------------+ Antecubital fossa                       not visualized +-----------------+-------------+----------+--------------+ Prox forearm                            not visualized +-----------------+-------------+----------+--------------+ Mid forearm                             not visualized +-----------------+-------------+----------+--------------+ Distal forearm                          not visualized +-----------------+-------------+----------+--------------+ *See table(s) above for measurements and observations.  Diagnosing physician: Servando Snare MD Electronically signed by Servando Snare MD on 07/18/2018 at 5:32:40 PM.    Final  Dg Fluoro Guide Lumbar Puncture  Result Date: 07/10/2018 CLINICAL DATA:  68 year old male with encephalopathy. Subsequent encounter. EXAM: DIAGNOSTIC LUMBAR PUNCTURE UNDER FLUOROSCOPIC GUIDANCE FLUOROSCOPY TIME:  Fluoroscopy Time:  6 seconds PROCEDURE: Patient was not able to give consent. Family (sister) not available by phone. Emergency request/consent from Dr. Candiss Norse. Labs and head CT reviewed. Under fluoroscopic guidance and aseptic technique (utilizing 1% lidocaine  as local anesthetic), L3-4 lumbar puncture was performed with a single pass of a 22 gauge spinal needle. Opening pressure 26 and closing pressure 20 cc water (not felt to be accurate as patient was lying on the stomach and moving throughout the exam). 8.5 cc of clear cerebral spinal fluid collected and sent for labs. Patient tolerated procedure well without any difficulty. IMPRESSION: Successful L3-4 lumbar puncture as noted above. Electronically Signed   By: Genia Del M.D.   On: 07/10/2018 15:12     LOS: 12 days   Oren Binet, MD  [facility]  If 7PM-7AM, please contact night-coverage  Please page via [email or web address]  Go to [email or web address] and use [facility] universal password to access. If you do not have the password, please contact the hospital operator.  Locate the [facility] provider you are looking for under [facility] and page to a number that you can be directly reached. If you still have difficulty reaching the provider, please page the [facility] (Director on Call) for the Hospitalists listed on amion for assistance.  07/19/2018, 1:28 PM

## 2018-07-20 ENCOUNTER — Encounter (HOSPITAL_COMMUNITY)
Admission: EM | Disposition: A | Discharge status: Skilled Nursing Facility | Payer: Self-pay | Attending: Internal Medicine

## 2018-07-20 ENCOUNTER — Inpatient Hospital Stay (HOSPITAL_COMMUNITY): Payer: Medicare HMO | Admitting: Anesthesiology

## 2018-07-20 ENCOUNTER — Inpatient Hospital Stay (HOSPITAL_COMMUNITY): Payer: Medicare HMO

## 2018-07-20 DIAGNOSIS — N185 Chronic kidney disease, stage 5: Secondary | ICD-10-CM

## 2018-07-20 DIAGNOSIS — E440 Moderate protein-calorie malnutrition: Secondary | ICD-10-CM

## 2018-07-20 HISTORY — PX: AV FISTULA PLACEMENT: SHX1204

## 2018-07-20 HISTORY — PX: INSERTION OF DIALYSIS CATHETER: SHX1324

## 2018-07-20 LAB — BASIC METABOLIC PANEL
Anion gap: 10 (ref 5–15)
BUN: 65 mg/dL — ABNORMAL HIGH (ref 8–23)
CO2: 24 mmol/L (ref 22–32)
Calcium: 7 mg/dL — ABNORMAL LOW (ref 8.9–10.3)
Chloride: 104 mmol/L (ref 98–111)
Creatinine, Ser: 4.95 mg/dL — ABNORMAL HIGH (ref 0.61–1.24)
GFR calc Af Amer: 13 mL/min — ABNORMAL LOW (ref >60)
GFR calc non Af Amer: 11 mL/min — ABNORMAL LOW (ref >60)
Glucose, Bld: 221 mg/dL — ABNORMAL HIGH (ref 70–99)
Potassium: 4 mmol/L (ref 3.5–5.1)
Sodium: 138 mmol/L (ref 135–145)

## 2018-07-20 LAB — CBC
HCT: 35.8 % — ABNORMAL LOW (ref 39.0–52.0)
Hemoglobin: 11.7 g/dL — ABNORMAL LOW (ref 13.0–17.0)
MCH: 29.5 pg (ref 26.0–34.0)
MCHC: 32.7 g/dL (ref 30.0–36.0)
MCV: 90.2 fL (ref 80.0–100.0)
Platelets: 156 10*3/uL (ref 150–400)
RBC: 3.97 MIL/uL — ABNORMAL LOW (ref 4.22–5.81)
RDW: 14.6 % (ref 11.5–15.5)
WBC: 5.2 10*3/uL (ref 4.0–10.5)
nRBC: 0 % (ref 0.0–0.2)

## 2018-07-20 LAB — GLUCOSE, CAPILLARY
Glucose-Capillary: 102 mg/dL — ABNORMAL HIGH (ref 70–99)
Glucose-Capillary: 93 mg/dL (ref 70–99)
Glucose-Capillary: 94 mg/dL (ref 70–99)
Glucose-Capillary: 101 mg/dL — ABNORMAL HIGH (ref 70–99)

## 2018-07-20 LAB — PROTIME-INR
INR: 1.28
Prothrombin Time: 15.9 seconds — ABNORMAL HIGH (ref 11.4–15.2)

## 2018-07-20 SURGERY — ARTERIOVENOUS (AV) FISTULA CREATION
Anesthesia: General | Laterality: Right

## 2018-07-20 MED ORDER — FENTANYL CITRATE (PF) 100 MCG/2ML IJ SOLN
INTRAMUSCULAR | Status: DC | PRN
  Administered 2018-07-20 (×2): 50 ug via INTRAVENOUS
  Administered 2018-07-20 (×2): 25 ug via INTRAVENOUS

## 2018-07-20 MED ORDER — LIDOCAINE 2% (20 MG/ML) 5 ML SYRINGE
INTRAMUSCULAR | Status: DC | PRN
  Administered 2018-07-20: 40 mg via INTRAVENOUS

## 2018-07-20 MED ORDER — PROPOFOL 500 MG/50ML IV EMUL: INTRAVENOUS | Status: DC | PRN

## 2018-07-20 MED ORDER — HEPARIN SODIUM (PORCINE) 1000 UNIT/ML IJ SOLN
INTRAMUSCULAR | Status: DC | PRN
  Administered 2018-07-20: 3400 [IU]

## 2018-07-20 MED ORDER — WARFARIN - PHARMACIST DOSING INPATIENT
Freq: Every day | Status: DC
  Administered 2018-07-20 – 2018-07-23 (×4)

## 2018-07-20 MED ORDER — LACTATED RINGERS IV SOLN: INTRAVENOUS | Status: DC

## 2018-07-20 MED ORDER — ACETAMINOPHEN 325 MG PO TABS: 325.0000 mg | ORAL_TABLET | Freq: Once | ORAL | Status: DC

## 2018-07-20 MED ORDER — SUCCINYLCHOLINE CHLORIDE 20 MG/ML IJ SOLN
INTRAMUSCULAR | Status: DC | PRN
  Administered 2018-07-20: 100 mg via INTRAVENOUS

## 2018-07-20 MED ORDER — WARFARIN SODIUM 5 MG PO TABS
5.0000 mg | ORAL_TABLET | Freq: Once | ORAL | Status: AC
  Administered 2018-07-20: 5 mg via ORAL
  Filled 2018-07-20 (×2): qty 1

## 2018-07-20 MED ORDER — MIDAZOLAM HCL 2 MG/2ML IJ SOLN
INTRAMUSCULAR | Status: AC
  Filled 2018-07-20: qty 2

## 2018-07-20 MED ORDER — ONDANSETRON HCL 4 MG/2ML IJ SOLN
INTRAMUSCULAR | Status: DC | PRN
  Administered 2018-07-20: 4 mg via INTRAVENOUS

## 2018-07-20 MED ORDER — HEPARIN SODIUM (PORCINE) 1000 UNIT/ML IJ SOLN
INTRAMUSCULAR | Status: AC
  Filled 2018-07-20: qty 1

## 2018-07-20 MED ORDER — LIDOCAINE-EPINEPHRINE 0.5 %-1:200000 IJ SOLN
INTRAMUSCULAR | Status: AC
  Filled 2018-07-20: qty 1

## 2018-07-20 MED ORDER — PROMETHAZINE HCL 25 MG/ML IJ SOLN: 6.2500 mg | INTRAMUSCULAR | Status: DC | PRN

## 2018-07-20 MED ORDER — SODIUM CHLORIDE 0.9 % IV SOLN
INTRAVENOUS | Status: DC | PRN
  Administered 2018-07-20: 08:00:00

## 2018-07-20 MED ORDER — MEPERIDINE HCL 50 MG/ML IJ SOLN: 6.2500 mg | INTRAMUSCULAR | Status: DC | PRN

## 2018-07-20 MED ORDER — HYDROCODONE-ACETAMINOPHEN 5-325 MG PO TABS
1.0000 | ORAL_TABLET | ORAL | Status: DC | PRN
  Administered 2018-07-24: 2 via ORAL
  Filled 2018-07-20: qty 2

## 2018-07-20 MED ORDER — ACETAMINOPHEN 10 MG/ML IV SOLN: 1000.0000 mg | Freq: Once | INTRAVENOUS | Status: DC | PRN

## 2018-07-20 MED ORDER — PROPOFOL 10 MG/ML IV BOLUS
INTRAVENOUS | Status: AC
  Filled 2018-07-20: qty 20

## 2018-07-20 MED ORDER — EPHEDRINE SULFATE 50 MG/ML IJ SOLN
INTRAMUSCULAR | Status: DC | PRN
  Administered 2018-07-20: 10 mg via INTRAVENOUS

## 2018-07-20 MED ORDER — FENTANYL CITRATE (PF) 100 MCG/2ML IJ SOLN: 25.0000 ug | INTRAMUSCULAR | Status: DC | PRN

## 2018-07-20 MED ORDER — ETOMIDATE 2 MG/ML IV SOLN
INTRAVENOUS | Status: DC | PRN
  Administered 2018-07-20: 10 mg via INTRAVENOUS

## 2018-07-20 MED ORDER — SODIUM CHLORIDE 0.9 % IV SOLN
INTRAVENOUS | Status: AC
  Filled 2018-07-20: qty 1.2

## 2018-07-20 MED ORDER — SODIUM CHLORIDE 0.9 % IV SOLN
INTRAVENOUS | Status: DC | PRN
  Administered 2018-07-20: 50 ug/min via INTRAVENOUS

## 2018-07-20 MED ORDER — FENTANYL CITRATE (PF) 250 MCG/5ML IJ SOLN
INTRAMUSCULAR | Status: AC
  Filled 2018-07-20: qty 5

## 2018-07-20 MED ORDER — ACETAMINOPHEN 160 MG/5ML PO SOLN: 325.0000 mg | Freq: Once | ORAL | Status: DC

## 2018-07-20 MED ORDER — 0.9 % SODIUM CHLORIDE (POUR BTL) OPTIME
TOPICAL | Status: DC | PRN
  Administered 2018-07-20: 1000 mL

## 2018-07-20 SURGICAL SUPPLY — 59 items
ARMBAND PINK RESTRICT EXTREMIT (MISCELLANEOUS) ×5 IMPLANT
BAG DECANTER FOR FLEXI CONT (MISCELLANEOUS) ×2 IMPLANT
BIOPATCH RED 1 DISK 7.0 (GAUZE/BANDAGES/DRESSINGS) ×3 IMPLANT
CANISTER SUCT 3000ML PPV (MISCELLANEOUS) ×3 IMPLANT
CANNULA VESSEL 3MM 2 BLNT TIP (CANNULA) ×3 IMPLANT
CATH PALINDROME RT-P 15FX19CM (CATHETERS) IMPLANT
CATH PALINDROME RT-P 15FX23CM (CATHETERS) ×1 IMPLANT
CATH PALINDROME RT-P 15FX28CM (CATHETERS) IMPLANT
CATH PALINDROME RT-P 15FX55CM (CATHETERS) IMPLANT
CLIP LIGATING EXTRA MED SLVR (CLIP) ×3 IMPLANT
CLIP LIGATING EXTRA SM BLUE (MISCELLANEOUS) ×3 IMPLANT
COVER PROBE W GEL 5X96 (DRAPES) ×3 IMPLANT
COVER SURGICAL LIGHT HANDLE (MISCELLANEOUS) ×3 IMPLANT
COVER WAND RF STERILE (DRAPES) ×3 IMPLANT
DECANTER SPIKE VIAL GLASS SM (MISCELLANEOUS) ×2 IMPLANT
DERMABOND ADVANCED (GAUZE/BANDAGES/DRESSINGS) ×2
DERMABOND ADVANCED .7 DNX12 (GAUZE/BANDAGES/DRESSINGS) ×2 IMPLANT
DRAPE C-ARM 42X72 X-RAY (DRAPES) ×3 IMPLANT
DRAPE CHEST BREAST 15X10 FENES (DRAPES) ×3 IMPLANT
ELECT REM PT RETURN 9FT ADLT (ELECTROSURGICAL) ×3
ELECTRODE REM PT RTRN 9FT ADLT (ELECTROSURGICAL) ×2 IMPLANT
GAUZE 4X4 16PLY RFD (DISPOSABLE) ×2 IMPLANT
GAUZE SPONGE 4X4 12PLY STRL LF (GAUZE/BANDAGES/DRESSINGS) ×1 IMPLANT
GLOVE BIO SURGEON STRL SZ7.5 (GLOVE) ×1 IMPLANT
GLOVE BIOGEL PI IND STRL 6.5 (GLOVE) IMPLANT
GLOVE BIOGEL PI INDICATOR 6.5 (GLOVE) ×4
GLOVE SS BIOGEL STRL SZ 7.5 (GLOVE) ×2 IMPLANT
GLOVE SUPERSENSE BIOGEL SZ 7.5 (GLOVE) ×1
GLOVE SURG SS PI 6.0 STRL IVOR (GLOVE) ×3 IMPLANT
GOWN STRL REUS W/ TWL LRG LVL3 (GOWN DISPOSABLE) ×6 IMPLANT
GOWN STRL REUS W/ TWL XL LVL3 (GOWN DISPOSABLE) IMPLANT
GOWN STRL REUS W/TWL LRG LVL3 (GOWN DISPOSABLE) ×4
GOWN STRL REUS W/TWL XL LVL3 (GOWN DISPOSABLE) ×1
GRAFT GORETEX STRT 4-7X45 (Vascular Products) ×1 IMPLANT
KIT BASIN OR (CUSTOM PROCEDURE TRAY) ×3 IMPLANT
KIT TURNOVER KIT B (KITS) ×3 IMPLANT
NDL 18GX1X1/2 (RX/OR ONLY) (NEEDLE) ×2 IMPLANT
NDL HYPO 25GX1X1/2 BEV (NEEDLE) ×2 IMPLANT
NEEDLE 18GX1X1/2 (RX/OR ONLY) (NEEDLE) ×3 IMPLANT
NEEDLE 22X1 1/2 (OR ONLY) (NEEDLE) ×1 IMPLANT
NEEDLE HYPO 25GX1X1/2 BEV (NEEDLE) IMPLANT
NS IRRIG 1000ML POUR BTL (IV SOLUTION) ×3 IMPLANT
PACK CV ACCESS (CUSTOM PROCEDURE TRAY) ×3 IMPLANT
PACK SURGICAL SETUP 50X90 (CUSTOM PROCEDURE TRAY) ×2 IMPLANT
PAD ARMBOARD 7.5X6 YLW CONV (MISCELLANEOUS) ×6 IMPLANT
SOAP 2 % CHG 4 OZ (WOUND CARE) ×3 IMPLANT
SUT ETHILON 3 0 PS 1 (SUTURE) ×3 IMPLANT
SUT PROLENE 6 0 CC (SUTURE) ×5 IMPLANT
SUT VIC AB 3-0 SH 27 (SUTURE) ×1
SUT VIC AB 3-0 SH 27X BRD (SUTURE) ×2 IMPLANT
SUT VICRYL 4-0 PS2 18IN ABS (SUTURE) ×3 IMPLANT
SYR 10ML LL (SYRINGE) ×2 IMPLANT
SYR 20CC LL (SYRINGE) ×2 IMPLANT
SYR 5ML LL (SYRINGE) ×6 IMPLANT
SYR CONTROL 10ML LL (SYRINGE) ×2 IMPLANT
TOWEL GREEN STERILE (TOWEL DISPOSABLE) ×6 IMPLANT
TOWEL GREEN STERILE FF (TOWEL DISPOSABLE) ×3 IMPLANT
UNDERPAD 30X30 (UNDERPADS AND DIAPERS) ×3 IMPLANT
WATER STERILE IRR 1000ML POUR (IV SOLUTION) ×3 IMPLANT

## 2018-07-20 NOTE — Interval H&P Note (Signed)
History and Physical Interval Note:  07/20/2018 7:30 AM  Keith Young  has presented today for surgery, with the diagnosis of CHRONIC KIDNEY DISEASE  The various methods of treatment have been discussed with the patient and family. After consideration of risks, benefits and other options for treatment, the patient has consented to  Procedure(s): ARTERIOVENOUS (AV) FISTULA CREATION VERSUS GRAFT ARM (Left) INSERTION OF DIALYSIS CATHETER (N/A) as a surgical intervention .  The patient's history has been reviewed, patient examined, no change in status, stable for surgery.  I have reviewed the patient's chart and labs.  Questions were answered to the patient's satisfaction.     Curt Jews

## 2018-07-20 NOTE — Transfer of Care (Signed)
Immediate Anesthesia Transfer of Care Note  Patient: Keith Young  Procedure(s) Performed: INSERTION ARTERIOVENOUS GORTEX GRAFT  LEFT ARM (Left ) EXCHANGE OF DIALYSIS CATHETER RIGHT INTERNAL JUGULAR (Right )  Patient Location: PACU  Anesthesia Type:General  Level of Consciousness: awake and alert   Airway & Oxygen Therapy: Patient Spontanous Breathing and Patient connected to face mask oxygen  Post-op Assessment: Report given to RN, Post -op Vital signs reviewed and stable and Patient moving all extremities X 4  Post vital signs: Reviewed and stable  Last Vitals:  Vitals Value Taken Time  BP    Temp    Pulse    Resp    SpO2      Last Pain:  Vitals:   07/19/18 1451  TempSrc: Oral  PainSc:       Patients Stated Pain Goal: 0 (59/09/31 1216)  Complications: No apparent anesthesia complications

## 2018-07-20 NOTE — Progress Notes (Signed)
Pt is confused about certain things.  So I had him sign consent for [place] AV fistula but also called and had telephone consent done by sister and brother in law.

## 2018-07-20 NOTE — Progress Notes (Signed)
ANTICOAGULATION CONSULT NOTE - Follow Up Consult  Pharmacy Consult for Coumadin Indication: atrial fibrillation  No Known Allergies  Patient Measurements: Height: 5\' 3"  (160 cm) Weight: 158 lb 15.2 oz (72.1 kg) IBW/kg (Calculated) : 56.9  Vital Signs: Temp: 97.8 F (36.6 C) (02/21 1320) Temp Source: Oral (02/21 1320) BP: 119/74 (02/21 1335) Pulse Rate: 94 (02/21 1335)  Labs: Recent Labs    07/18/18 0341 07/19/18 0500 07/19/18 1148 07/20/18 0400  HGB 12.2*  --   --  11.7*  HCT 38.7*  --   --  35.8*  PLT 127*  --   --  156  LABPROT 14.3 14.8  --  15.9*  INR 1.12 1.17  --  1.28  CREATININE 3.74*  --  4.61* 4.95*    Estimated Creatinine Clearance: 13.1 mL/min (A) (by C-G formula based on SCr of 4.95 mg/dL (H)).  Assessment: Anticoag:  None PTA. s/p IV heparin for new onset AFib with RVR, CHA2DS2-Vasc score 5 >>stopped due to thrombocytopenia. Coumadin started 2/18. Hgb 11.7. Plts 156 rising. INR 1.28 with Coum on hold last PM for HD catheter replacement today. - Book, video 2/18 but too confused  Goal of Therapy:  INR 2-3 Monitor platelets by anticoagulation protocol: Yes   Plan:  - HD cath placement today 2/21 - Ok to resume Coumadin tonight at 5mg  po x 1, Daily INR - f/u to teach patient when/if encephalopathy improved   Shelbee Apgar S. Alford Highland, PharmD, [place] Clinical Staff Pharmacist Eilene Ghazi Stillinger 07/20/2018,2:07 PM

## 2018-07-20 NOTE — Progress Notes (Signed)
SLP cancellation note: Pt in procedure - AV fistula.  Will continue to follow for dysphagia.  Evaline Waltman L. Tivis Ringer, [place] Office number [phone] Pager [phone]

## 2018-07-20 NOTE — Anesthesia Postprocedure Evaluation (Addendum)
Anesthesia Post Note  Patient: KHALIN ROYCE  Procedure(s) Performed: INSERTION ARTERIOVENOUS GORTEX GRAFT  LEFT ARM (Left ) EXCHANGE OF DIALYSIS CATHETER RIGHT INTERNAL JUGULAR (Right )     Patient location during evaluation: PACU Anesthesia Type: General Level of consciousness: awake and alert Pain management: pain level controlled Vital Signs Assessment: post-procedure vital signs reviewed and stable Respiratory status: spontaneous breathing, nonlabored ventilation, respiratory function stable and patient connected to nasal cannula oxygen Cardiovascular status: blood pressure returned to baseline and stable Postop Assessment: no apparent nausea or vomiting Anesthetic complications: no    Last Vitals:  Vitals:   07/20/18 1430 07/20/18 1500  BP: 115/74 107/66  Pulse: 87 85  Resp:    Temp:    SpO2:      Last Pain:  Vitals:   07/20/18 1320  TempSrc: Oral  PainSc: 0-No pain                 Effie Berkshire

## 2018-07-20 NOTE — Progress Notes (Addendum)
Physical Therapy Treatment Patient Details Name: Keith Young MRN: [id number] DOB: September 08, 1950 Today's Date: 07/20/2018    History of Present Illness Patient is a 68 year old male admitted with acute mental status change and lethargy, septic shock  According to chart patient lives in facility. PMH to include: CHF, CKD, DM, HTN, HIV, COPD, OA S/P IJ catheter on 2/21.    PT Comments    Pt s/p IJ catheter placement. Pt agreed to perform therex and transfer training. Pt heart monitor off and with pads detached. Leads reapplied and monitor turned back on with nursing notified of pt status when entering the room. Pt tolerated interventions well and progressed with sit to stand from EOB with use of front wheeled walker. Pt able to tolerate BLE weight bearing in standing, but with slow shuffling steps and max cues during pivot transition to chair. Pt unable to grasp chair arms behind for sit to stand and required mod A to come to seated and avoid LOB. Plan to progress with gait as tolerated. Pt discharge plan of SNF is appropriate at this time based on current progress.    Follow Up Recommendations  SNF     Equipment Recommendations  None recommended by PT    Recommendations for Other Services       Precautions / Restrictions Precautions Precautions: Fall Restrictions Weight Bearing Restrictions: No    Mobility  Bed Mobility Overal bed mobility: Needs Assistance Bed Mobility: Supine to Sit Rolling: Mod assist         General bed mobility comments: Pt required assistance to advance LEs to edge of bed and to elevate trunk into sitting. Max A +1 for scooting forward. Total +2 for scooting back in recliner with bed pad.   Transfers Overall transfer level: Needs assistance Equipment used: Rolling walker (2 wheeled) Transfers: Sit to/from [facility] Sit to Stand: Mod assist;+2 physical assistance Stand pivot transfers: Mod assist;+2 physical assistance        General transfer comment: Pt performed sit to stand from elevated surface of bed with cues for hand placement on EOB and cues to push with UEs to achieve standing.  Pt with posterior bias upon standing.  Assistance to turn RW.  Ambulation/Gait Ambulation/Gait assistance: Mod assist;+2 physical assistance Gait Distance (Feet): 3 Feet Assistive device: Rolling walker (2 wheeled) Gait Pattern/deviations: Shuffle;Step-to pattern;Decreased step length - left;Decreased step length - right;Narrow base of support     General Gait Details: Able to complete several small shuffling steps within RW to get lined up with recliner chair.    Stairs             Wheelchair Mobility    Modified Rankin (Stroke Patients Only)       Balance Overall balance assessment: Needs assistance Sitting-balance support: Bilateral upper extremity supported Sitting balance-Leahy Scale: Poor Sitting balance - Comments: posterior lean intially due to posterior pelvic tilt.       Standing balance-Leahy Scale: Poor Standing balance comment: Pt requires vc/tc to achieve standing posture after stating he felt like he was going backwards. Pt potentially limited by arm discomfort when using the FWW to put load through arms from cath placement on RUE earlier in the day.                             Cognition Arousal/Alertness: Awake/alert Behavior During Therapy: WFL for tasks assessed/performed Overall Cognitive Status: No family/caregiver present to determine baseline cognitive functioning  General Comments: Pt nods in understanding of single commands       Exercises General Exercises - Lower Extremity Ankle Circles/Pumps: AROM;Both;10 reps;Supine Quad Sets: AROM;Both;10 reps;Supine Long Arc Quad: AROM;Seated;10 reps;Both Heel Slides: AROM;Both;10 reps;Supine    General Comments        Pertinent Vitals/Pain Pain Assessment: No/denies pain     Home Living                      Prior Function            PT Goals (current goals can now be found in the care plan section) Acute Rehab PT Goals Patient Stated Goal: none stated PT Goal Formulation: Patient unable to participate in goal setting Potential to Achieve Goals: Fair    Frequency    Min 2X/week      PT Plan Current plan remains appropriate    Co-evaluation              AM-PAC PT "6 Clicks" Mobility   Outcome Measure  Help needed turning from your back to your side while in a flat bed without using bedrails?: A Lot Help needed moving from lying on your back to sitting on the side of a flat bed without using bedrails?: A Lot Help needed moving to and from a bed to a chair (including a wheelchair)?: A Lot Help needed standing up from a chair using your arms (e.g., wheelchair or bedside chair)?: A Lot Help needed to walk in hospital room?: A Lot Help needed climbing 3-5 steps with a railing? : Total 6 Click Score: 11    End of Session Equipment Utilized During Treatment: Gait belt Activity Tolerance: Patient tolerated treatment well Patient left: with call bell/phone within reach;in chair;with chair alarm set Nurse Communication: Mobility status PT Visit Diagnosis: Muscle weakness (generalized) (M62.81)     Time: 3846-6599 PT Time Calculation (min) (ACUTE ONLY): 24 min  Charges:  $Therapeutic Exercise: 8-22 mins $Therapeutic Activity: 8-22 mins                     Maryelizabeth Kaufmann, SPTA   Maryelizabeth Kaufmann 07/20/2018, 2:15 PM

## 2018-07-20 NOTE — Op Note (Signed)
    OPERATIVE REPORT  DATE OF SURGERY: 07/20/2018  PATIENT: Keith Young, 68 y.o. male MRN: [id number]  DOB: 08-Jul-1950  PRE-OPERATIVE DIAGNOSIS: End-stage renal disease  POST-OPERATIVE DIAGNOSIS:  Same  PROCEDURE: #1 left forearm loop AV Gore-Tex graft, #2 right IJ tunneled hemodialysis catheter  SURGEON:  Curt Jews, M.D.  PHYSICIAN ASSISTANT: Matt Eveland, PA-C  ANESTHESIA: General  EBL: per anesthesia record  Total I/O In: 600 [I.V.:600] Out: 370 [Urine:350; Blood:20]  BLOOD ADMINISTERED: none  DRAINS: none  SPECIMEN: none  COUNTS CORRECT:  YES  PATIENT DISPOSITION:  PACU - hemodynamically stable  PROCEDURE DETAILS: The patient was taken to the operating placed supine position where the area of the left arm was prepped and draped in usual sterile fashion.  SonoSite ultrasound was used to visualize the veins.  The patient had a moderate size cephalic vein at the wrist but this was thrombosed from an IV.  The upper arm cephalic and basilic veins were too small for fistula attempt.  The patient did have a good caliber brachial vein.  Incision was made over the antecubital space and carried down to isolate the brachial artery which was of good size and also the brachial vein.  Tributary branches of these were ligated with 3-0 silk ties and divided.  A separate incision was made over the distal forearm in a loop configuration tunnel was created.  A 4 x 7 Gore-Tex graft was brought through the tunnel.  The brachial artery was occluded proximally distally and was opened with 11 blade and sent longstanding with pot scissors.  The graft was transected and approximately 5 mm diameter portion of the graft and was sewn end-to-side to the artery with a running 6-0 Prolene suture.  The anastomosis was tested and found to be adequate.  The graft was brought through the appropriate tension and the loop configuration tunnel.  The vein was occluded proximally distally and was opened with 11  blade and sent arterial pot scissors.  The Gore-Tex graft was spatulated and sewn end-to-side to the vein with a running 6-0 Prolene suture.  Clamps removed and excellent thrill was noted.  The wounds were irrigated with saline.  Hemostasis obtained with cautery.  The wounds were closed with 3-0 Vicryl in the subcutaneous and subcuticular tissue.  Sterile dressing was applied.  Attention was then turned to the right neck.  The right and left neck and chest were prepped and draped in usual sterile fashion.  Patient did have an existing temporary catheter at the base of the neck in the right internal jugular vein.  A guidewire was passed through the existing catheter and the existing catheter was removed.  A dilator and peel-away sheath was passed over the guidewire and the dilator was removed.  The 23 cm catheter was placed through the peel-away sheath and was positioned the level of the distal right atrium and this was confirmed with fluoroscopy.  The peel-away sheath was removed.  The catheter was brought through a subcutaneous tunnel through a separate stab incision and the 2 lm ports were attached.  Both lumens flushed and aspirated easily and were locked with 1000 unit/cc heparin.  The catheter was secured to the skin with a 3-0 nylon stitch and the entry site was closed with a 4 subcuticular Vicryl stitch.  Sterile dressing was applied the patient was transferred to the recovery room where chest x-rays pending   Keith Young, M.D., [facility] 07/20/2018 10:35 AM

## 2018-07-20 NOTE — Progress Notes (Signed)
[facility]   SUBJECTIVE:    Status post right IJ tunneled dialysis catheter and left upper extremity AV graft today with vascular surgery  Patient remains awake, alert, confused  No update on outpatient clip as AKI  No documented UOP yesterday  No a.m. labs, labs from yesterday with worsening renal function   OBJECTIVE:  Vitals:   07/20/18 1020 07/20/18 1035  BP: 130/72 113/83  Pulse: 89 88  Resp: 19 18  Temp:  97.8 F (36.6 C)  SpO2: 94% 94%    Intake/Output Summary (Last 24 hours) at 07/20/2018 1233 Last data filed at 07/20/2018 1006 Gross per 24 hour  Intake 600 ml  Output 370 ml  Net 230 ml      Genearl: comfortably laying at 30 deg, arouses to voice, localizes, answers simple questions. Neck:  RIJ tunneled cath c/d/i CV:  RRR  No rub Lungs CTAB Abd:  abd SNT/ND with normal BS GU:  Bladder non-palpable, Foley catheter in place Extremities:  No LE edema. Skin:  No skin rash Left upper arm AVG +B/T   MEDICATIONS:  . bictegravir-emtricitabine-tenofovir AF  1 tablet Oral Daily  . Chlorhexidine Gluconate Cloth  6 each Topical Q0600  . feeding supplement (ENSURE ENLIVE)  237 mL Oral BID BM  . insulin aspart  0-9 Units Subcutaneous TID WC  . mouth rinse  15 mL Mouth Rinse BID  . metoprolol succinate  25 mg Oral Daily  . mometasone-formoterol  2 puff Inhalation BID  . multivitamin  1 tablet Oral QHS  . predniSONE  40 mg Oral Q breakfast  . warfarin   Does not apply Once       LABS:   CBC Latest Ref Rng & Units 07/20/2018 07/18/2018 07/17/2018  WBC 4.0 - 10.5 K/uL 5.2 6.9 3.1(L)  Hemoglobin 13.0 - 17.0 g/dL 11.7(L) 12.2(L) 11.8(L)  Hematocrit 39.0 - 52.0 % 35.8(L) 38.7(L) 37.3(L)  Platelets 150 - 400 K/uL 156 127(L) 103(L)    CMP Latest Ref Rng & Units 07/20/2018 07/19/2018 07/18/2018  Glucose 70 - 99 mg/dL 221(H) 113(H) 93  BUN 8 - 23 mg/dL 65(H) 59(H) 48(H)  Creatinine 0.61 - 1.24 mg/dL 4.95(H) 4.61(H) 3.74(H)  Sodium 135 - 145 mmol/L  138 140 140  Potassium 3.5 - 5.1 mmol/L 4.0 3.3(L) 3.7  Chloride 98 - 111 mmol/L 104 106 105  CO2 22 - 32 mmol/L 24 24 25   Calcium 8.9 - 10.3 mg/dL 7.0(L) 6.8(L) 7.0(L)  Total Protein 6.5 - 8.1 g/dL - - -  Total Bilirubin 0.3 - 1.2 mg/dL - - -  Alkaline Phos 38 - 126 U/L - - -  AST 15 - 41 U/L - - -  ALT 0 - 44 U/L - - -    Lab Results  Component Value Date   PTH 52 04/17/2018   CALCIUM 7.0 (L) 07/20/2018   CAION 1.27 (H) 11/28/2015   PHOS 3.4 04/17/2018       Component Value Date/Time   COLORURINE AMBER (A) 07/08/2018 0839   APPEARANCEUR CLOUDY (A) 07/08/2018 0839   LABSPEC 1.016 07/08/2018 0839   PHURINE 5.0 07/08/2018 0839   GLUCOSEU NEGATIVE 07/08/2018 0839   HGBUR SMALL (A) 07/08/2018 0839   BILIRUBINUR NEGATIVE 07/08/2018 0839   KETONESUR 5 (A) 07/08/2018 0839   PROTEINUR 100 (A) 07/08/2018 0839   UROBILINOGEN 0.2 10/23/2013 1545   NITRITE NEGATIVE 07/08/2018 0839   LEUKOCYTESUR LARGE (A) 07/08/2018 0839      Component Value Date/Time   PHART 7.346 (L)  07/08/2018 1235   PCO2ART 32.0 07/08/2018 1235   PO2ART 86.4 07/08/2018 1235   HCO3 17.0 (L) 07/08/2018 1235   TCO2 25 11/28/2015 1304   ACIDBASEDEF 7.5 (H) 07/08/2018 1235   O2SAT 95.9 07/08/2018 1235       Component Value Date/Time   IRON 40 (L) 04/17/2018 0734   TIBC 260 04/17/2018 0734   FERRITIN 39 04/17/2018 0734   IRONPCTSAT 15 (L) 04/17/2018 0734       ASSESSMENT/PLAN:      1.  Acute on chronic stage IV renal disease; probable baseline renal function 2.4-2.8 and etiology is hypertension, diabetes.   Acute insult likely ATN during E. coli sepsis.  Started HD 2/10.  Remains dialysis dependent.  No clear evidence of recovery. S/p R IJ TDC and LUE AVG 07/20/18.  HD today: 3K, 3.5h, NO heparin; then watch over weekend   2.  Sepsis secondary to E coli in urine and blood.  Continue antibiotics per ID - plan 2 wk course.  TEE Ok.  CT A/P showed diverticula.  Blood cultures negative from 2/10  3.   Metabolic encephalopathy.  Likely secondary to sepsis complicated by uremia.  LP not c/w infection. Is awake and alert but not very aware of current circumstances, not sure his baseline prior to admission.   4.  Hyperkalemia.  resolved  5.  Bioprosthetic AVR, Chronic dual HF with EF , A fib with RVR:  Cardiology following.  Anticoagulation on hold in light of thrombocytopenia, rec start after patient has permanent access  7. HIV: on biktarvy   Pearson Grippe MD

## 2018-07-20 NOTE — Anesthesia Procedure Notes (Signed)
Procedure Name: Intubation Date/Time: 07/20/2018 7:59 AM Performed by: Neldon Newport, CRNA Pre-anesthesia Checklist: Timeout performed, Patient being monitored, Suction available, Emergency Drugs available and Patient identified Patient Re-evaluated:Patient Re-evaluated prior to induction Oxygen Delivery Method: Circle system utilized Preoxygenation: Pre-oxygenation with 100% oxygen Induction Type: IV induction and Rapid sequence Ventilation: Mask ventilation without difficulty and Oral airway inserted - appropriate to patient size Laryngoscope Size: Mac and 3 Grade View: Grade I Tube type: Oral (LTA used) Tube size: 7.5 mm Number of attempts: 1 Placement Confirmation: breath sounds checked- equal and bilateral,  positive ETCO2 and ETT inserted through vocal cords under direct vision Secured at: 22 cm Tube secured with: Tape Dental Injury: Teeth and Oropharynx as per pre-operative assessment

## 2018-07-20 NOTE — Progress Notes (Signed)
PROGRESS NOTE        PATIENT DETAILS Name: Keith Young Age: 68 y.o. Sex: male Date of Birth: 1950/07/28 Admit Date: 07/06/2018 Admitting Physician Etta Quill, DO ZDG:LOVFI, Rene Kocher, MD  Brief Narrative: Patient is a 68 y.o. male with history of chronic systolic heart failure with AICD in place, bioprosthetic AVR, stage IV CKD, DM-2, COPD, HIV on antiretrovirals-presented with sepsis secondary to E. coli bacteremia and acute kidney injury.  See below for further details  Subjective: Awake-slow to answer but mostly able to answer my questions appropriately.  Denies any chest pain or shortness of breath.  Assessment/Plan: Sepsis secondary to E. coli bacteremia: Sepsis pathophysiology has resolved-evaluated by infectious disease, recommendations are for 2-week duration of ceftazidime-end date of February 23.  CT abdomen negative for any foci for bacteremia.  Acute metabolic encephalopathy: Multifactorial-secondary to sepsis and also from uremia due to worsening renal function.  Encephalopathy has markedly improved-although slow-he is able to answer all my questions appropriately this morning. Underwent LP on 2/11-CSF not consistent with meningitis.  Acute kidney injury on stage IV CKD: AKI likely secondary to ATN in the setting of sepsis.  Has required intermittent HD-s/p right IJ tunneled HD catheter, and left upper extremity AV graft placement by V VS on 2/21.  Worsening creatinine today-no clear signs of recovery-for HD later today.  Nephrology following.  HIV: Continue antiretrovirals.  PAF: Rate controlled-continue metoprolol.  No other patient is s/p right IJ tunneled catheter and AV graft placement-we will restart Coumadin.   Thrombocytopenia: Secondary to sepsis-resolved.  Chronic systolic heart failure (EF 20-25% by TTE on 07/12/2018): Him status relatively stable-diuresis with HD.    Bioprosthetic AVR: TTE on 2/13 without any obvious  vegetation-repeat blood cultures on 2/10.  DM-2 with hypoglycemia: CBGs relatively stable-no hypoglycemic episodes overnight.  Continue SSI.  Hypertension: Controlled-continue metoprolol.  Dysphagia: Speech therapy following-on dysphagia 2 diet.  Has prior history of CVA-along with profound deconditioning-may be contributing to oropharyngeal dysphagia.  Deconditioning/debility: Secondary to acute illness-PT evaluation completed-SNF on discharge  DVT Prophylaxis: SCD's  Code Status: Full code   Family Communication: None at bedside  Disposition Plan: Remain inpatient-SNF on discharge -but needs clipping process first.  Antimicrobial agents: Anti-infectives (From admission, onward)   Start     Dose/Rate Route Frequency Ordered Stop   07/20/18 0600  cefUROXime (ZINACEF) 1.5 g in sodium chloride 0.9 % 100 mL IVPB  Status:  Discontinued     1.5 g 200 mL/hr over 30 Minutes Intravenous On call to O.R. 07/19/18 1249 07/20/18 1155   07/12/18 2200  cefTAZidime (FORTAZ) 1 g in sodium chloride 0.9 % 100 mL IVPB     1 g 200 mL/hr over 30 Minutes Intravenous Every 24 hours 07/12/18 1449 07/22/18 2359   07/10/18 1430  vancomycin (VANCOCIN) IVPB 1000 mg/200 mL premix  Status:  Discontinued     1,000 mg 200 mL/hr over 60 Minutes Intravenous Every 48 hours 07/08/18 1421 07/09/18 0959   07/10/18 1000  cefTRIAXone (ROCEPHIN) 2 g in sodium chloride 0.9 % 100 mL IVPB  Status:  Discontinued     2 g 200 mL/hr over 30 Minutes Intravenous Every 24 hours 07/10/18 0942 07/12/18 1433   07/10/18 0847  cefTRIAXone (ROCEPHIN) 2 g in sodium chloride 0.9 % 100 mL IVPB  Status:  Discontinued     2 g 200  mL/hr over 30 Minutes Intravenous Every 24 hours 07/09/18 1004 07/10/18 0933   07/09/18 1400  acyclovir (ZOVIRAX) 745 mg in dextrose 5 % 150 mL IVPB  Status:  Discontinued     10 mg/kg  74.5 kg 164.9 mL/hr over 60 Minutes Intravenous Every 24 hours 07/08/18 1421 07/09/18 0959   07/09/18 1300   bictegravir-emtricitabine-tenofovir AF (BIKTARVY) 50-200-25 MG per tablet 1 tablet     1 tablet Oral Daily 07/09/18 1012     07/08/18 2300  cefTRIAXone (ROCEPHIN) 2 g in sodium chloride 0.9 % 100 mL IVPB  Status:  Discontinued     2 g 200 mL/hr over 30 Minutes Intravenous Every 12 hours 07/08/18 1341 07/09/18 1004   07/08/18 1800  vancomycin (VANCOCIN) IVPB 1000 mg/200 mL premix  Status:  Discontinued     1,000 mg 200 mL/hr over 60 Minutes Intravenous Every 48 hours 07/06/18 2019 07/07/18 1137   07/08/18 1430  ampicillin (OMNIPEN) 2 g in sodium chloride 0.9 % 100 mL IVPB  Status:  Discontinued     2 g 300 mL/hr over 20 Minutes Intravenous Every 12 hours 07/08/18 1343 07/09/18 0959   07/08/18 1430  vancomycin (VANCOCIN) 1,500 mg in sodium chloride 0.9 % 500 mL IVPB     1,500 mg 250 mL/hr over 120 Minutes Intravenous  Once 07/08/18 1421 07/08/18 1723   07/08/18 1400  acyclovir (ZOVIRAX) 745 mg in dextrose 5 % 150 mL IVPB     10 mg/kg  74.5 kg 164.9 mL/hr over 60 Minutes Intravenous  Once 07/08/18 1345 07/09/18 1315   07/07/18 1800  ceFEPIme (MAXIPIME) 1 g in sodium chloride 0.9 % 100 mL IVPB  Status:  Discontinued     1 g 200 mL/hr over 30 Minutes Intravenous Every 24 hours 07/06/18 2019 07/07/18 1137   07/07/18 1145  cefTRIAXone (ROCEPHIN) 2 g in sodium chloride 0.9 % 100 mL IVPB  Status:  Discontinued     2 g 200 mL/hr over 30 Minutes Intravenous Every 24 hours 07/07/18 1137 07/08/18 1341   07/07/18 1000  bictegravir-emtricitabine-tenofovir AF (BIKTARVY) 50-200-25 MG per tablet 1 tablet  Status:  Discontinued     1 tablet Oral Daily 07/07/18 0058 07/07/18 1127   07/06/18 1900  ceFEPIme (MAXIPIME) 2 g in sodium chloride 0.9 % 100 mL IVPB     2 g 200 mL/hr over 30 Minutes Intravenous  Once 07/06/18 1859 07/06/18 2001   07/06/18 1900  vancomycin (VANCOCIN) IVPB 1000 mg/200 mL premix     1,000 mg 200 mL/hr over 60 Minutes Intravenous  Once 07/06/18 1859 07/06/18 2035       Procedures: 2/21>> right IJ tunneled HD catheter, and AV fistula placement by vascular surgery 2/11>>LP 2/10>> IR guided HD catheter placement  CONSULTS:  cardiology, nephrology, neurology and vascular surgery  ID  Time spent: 25-minutes-Greater than 50% of this time was spent in counseling, explanation of diagnosis, planning of further management, and coordination of care.  MEDICATIONS: Scheduled Meds: . bictegravir-emtricitabine-tenofovir AF  1 tablet Oral Daily  . Chlorhexidine Gluconate Cloth  6 each Topical Q0600  . feeding supplement (ENSURE ENLIVE)  237 mL Oral BID BM  . insulin aspart  0-9 Units Subcutaneous TID WC  . mouth rinse  15 mL Mouth Rinse BID  . metoprolol succinate  25 mg Oral Daily  . mometasone-formoterol  2 puff Inhalation BID  . multivitamin  1 tablet Oral QHS  . predniSONE  40 mg Oral Q breakfast  . warfarin  5 mg Oral  ONCE-1800  . warfarin   Does not apply Once  . Warfarin - Pharmacist Dosing Inpatient   Does not apply q1800   Continuous Infusions: . sodium chloride 0 mL/hr at 07/15/18 0106  . cefTAZidime (FORTAZ)  IV 1 g (07/19/18 2316)   PRN Meds:.sodium chloride, acetaminophen **OR** acetaminophen, albuterol, HYDROcodone-acetaminophen, metoprolol tartrate, ondansetron **OR** ondansetron (ZOFRAN) IV, RESOURCE THICKENUP CLEAR, sodium chloride flush   PHYSICAL EXAM: Vital signs: Vitals:   07/20/18 1320 07/20/18 1330 07/20/18 1335 07/20/18 1400  BP: 117/76 122/73 119/74 117/73  Pulse: 81 97 94 81  Resp: 18     Temp: 97.8 F (36.6 C)     TempSrc: Oral     SpO2: 95%     Weight: 72.1 kg     Height:       Filed Weights   07/15/18 0656 07/17/18 1231 07/20/18 1320  Weight: 73.6 kg 73.5 kg 72.1 kg   Body mass index is 28.16 kg/m.   General appearance:Awake, alert, not in any distress.  Chronically sick appearing. Eyes:no scleral icterus. HEENT: Atraumatic and Normocephalic Neck: supple, no JVD. Resp:Good air entry bilaterally,no rales  or rhonchi CVS: S1 S2 regular GI: Bowel sounds present, Non tender and not distended with no gaurding, rigidity or rebound. Extremities: B/L Lower Ext shows no edema, both legs are warm to touch   I have personally reviewed following labs and imaging studies  LABORATORY DATA: CBC: Recent Labs  Lab 07/14/18 0531 07/15/18 0530 07/16/18 0430 07/17/18 0416 07/18/18 0341 07/20/18 0400  WBC 4.3 4.3 2.8* 3.1* 6.9 5.2  NEUTROABS 3.2 3.0 2.0 2.1 5.7  --   HGB 12.7* 12.4* 11.3* 11.8* 12.2* 11.7*  HCT 41.3 38.4* 35.4* 37.3* 38.7* 35.8*  MCV 90.2 90.4 90.1 89.9 89.6 90.2  PLT 68* 82* 89* 103* 127* 854    Basic Metabolic Panel: Recent Labs  Lab 07/14/18 0531  07/16/18 0430 07/17/18 0416 07/18/18 0341 07/19/18 1148 07/20/18 0400  NA 138   < > 139 139 140 140 138  K 4.4   < > 3.7 3.5 3.7 3.3* 4.0  CL 104   < > 104 103 105 106 104  CO2 23   < > 22 23 25 24 24   GLUCOSE 157*   < > 135* 114* 93 113* 221*  BUN 53*   < > 87* 95* 48* 59* 65*  CREATININE 4.43*   < > 5.94* 6.00* 3.74* 4.61* 4.95*  CALCIUM 7.1*   < > 6.4* 6.4* 7.0* 6.8* 7.0*  MG 2.1  --  2.2 2.1 1.9  --   --    < > = values in this interval not displayed.    GFR: Estimated Creatinine Clearance: 13.1 mL/min (A) (by C-G formula based on SCr of 4.95 mg/dL (H)).  Liver Function Tests: Recent Labs  Lab 07/16/18 0430  AST 31  ALT 36  ALKPHOS 62  BILITOT 0.5  PROT 5.5*  ALBUMIN 2.0*   No results for input(s): LIPASE, AMYLASE in the last 168 hours. No results for input(s): AMMONIA in the last 168 hours.  Coagulation Profile: Recent Labs  Lab 07/18/18 0341 07/19/18 0500 07/20/18 0400  INR 1.12 1.17 1.28    Cardiac Enzymes: No results for input(s): CKTOTAL, CKMB, CKMBINDEX, TROPONINI in the last 168 hours.  BNP (last 3 results) No results for input(s): PROBNP in the last 8760 hours.  HbA1C: No results for input(s): HGBA1C in the last 72 hours.  CBG: Recent Labs  Lab 07/19/18 [phone] 07/19/18 1237  07/19/18 1642 07/20/18 1008 07/20/18 1210  GLUCAP 97 163* 284* 102* 93    Lipid Profile: No results for input(s): CHOL, HDL, LDLCALC, TRIG, CHOLHDL, LDLDIRECT in the last 72 hours.  Thyroid Function Tests: No results for input(s): TSH, T4TOTAL, FREET4, T3FREE, THYROIDAB in the last 72 hours.  Anemia Panel: No results for input(s): VITAMINB12, FOLATE, FERRITIN, TIBC, IRON, RETICCTPCT in the last 72 hours.  Urine analysis:    Component Value Date/Time   COLORURINE AMBER (A) 07/08/2018 0839   APPEARANCEUR CLOUDY (A) 07/08/2018 0839   LABSPEC 1.016 07/08/2018 0839   PHURINE 5.0 07/08/2018 0839   GLUCOSEU NEGATIVE 07/08/2018 0839   HGBUR SMALL (A) 07/08/2018 0839   BILIRUBINUR NEGATIVE 07/08/2018 0839   KETONESUR 5 (A) 07/08/2018 0839   PROTEINUR 100 (A) 07/08/2018 0839   UROBILINOGEN 0.2 10/23/2013 1545   NITRITE NEGATIVE 07/08/2018 0839   LEUKOCYTESUR LARGE (A) 07/08/2018 0839    Sepsis Labs: Lactic Acid, Venous    Component Value Date/Time   LATICACIDVEN 1.5 07/08/2018 1952    MICROBIOLOGY: Recent Results (from the past 240 hour(s))  CSF culture     Status: None   Collection Time: 07/10/18  2:44 PM  Result Value Ref Range Status   Specimen Description CSF  Final   Special Requests NONE  Final   Gram Stain   Final    WBC PRESENT,BOTH PMN AND MONONUCLEAR NO ORGANISMS SEEN CYTOSPIN SMEAR    Culture   Final    NO GROWTH 3 DAYS Performed at [facility], [address], [place], [place] [address]    Report Status 07/13/2018 FINAL  Final  Fungus Culture With Stain     Status: None (Preliminary result)   Collection Time: 07/10/18  2:44 PM  Result Value Ref Range Status   Fungus Stain Final report  Final    Comment: (NOTE) Performed At: [facility] [place], [place] [id number] Rush Farmer MD [id number]    Fungus (Mycology) Culture PENDING  Incomplete   Fungal Source CSF  Final  Fungus Culture Result     Status: None    Collection Time: 07/10/18  2:44 PM  Result Value Ref Range Status   Result 1 Comment  Final    Comment: (NOTE) KOH/Calcofluor preparation:  no fungus observed. Performed At: [facility] [place], [place] [id number] Rush Farmer MD [id number]     RADIOLOGY STUDIES/RESULTS: Ct Abdomen Pelvis Wo Contrast  Result Date: 07/12/2018 CLINICAL DATA:  Bacteremia EXAM: CT ABDOMEN AND PELVIS WITHOUT CONTRAST TECHNIQUE: Multidetector CT imaging of the abdomen and pelvis was performed following the standard protocol without IV contrast. COMPARISON:  None. FINDINGS: Lower chest: Lung bases demonstrate mild atelectatic changes without sizable effusion. Coronary calcifications are seen. Defibrillator is again noted. Hepatobiliary: No focal liver abnormality is seen. No gallstones, gallbladder wall thickening, or biliary dilatation. Pancreas: Unremarkable. No pancreatic ductal dilatation or surrounding inflammatory changes. Spleen: Normal in size without focal abnormality. Adrenals/Urinary Tract: Adrenal glands are within normal limits. Kidneys are well visualized bilaterally. Right renal cyst is seen. A smaller left renal cyst is noted as well no renal calculi or obstructive changes are seen. The ureters are within normal limits bilaterally. The bladder is partially distended. Stomach/Bowel: Diverticular changes noted within the colon without evidence of diverticulitis. The appendix is within normal limits. No small bowel inflammatory changes are seen. Stomach is decompressed. Vascular/Lymphatic: Aortic atherosclerosis. No enlarged abdominal or pelvic lymph nodes. Reproductive: Prostate is unremarkable. Other: No abdominal wall  hernia or abnormality. No abdominopelvic ascites. Musculoskeletal: Degenerative changes of the lumbar spine are seen. IMPRESSION: Diverticulosis without diverticulitis. Bilateral renal cysts. Mild bibasilar atelectasis. Electronically Signed   By: Inez Catalina M.D.    On: 07/12/2018 19:09   Dg Chest 1 View  Result Date: 07/07/2018 CLINICAL DATA:  68 year old male with history of tachycardia. EXAM: CHEST  1 VIEW COMPARISON:  Chest x-ray 07/06/2018. FINDINGS: Status post median sternotomy. External defibrillator pad projecting over the lower left hemithorax. Left-sided pacemaker/AICD with lead tip projecting over the expected location of the right ventricular apex. Lung volumes are slightly low. No acute consolidative airspace disease. Small left pleural effusion. No right pleural effusion. There is cephalization of the pulmonary vasculature and slight indistinctness of the interstitial markings suggestive of mild pulmonary edema. Mild cardiomegaly. The patient is rotated to the left on today's exam, resulting in distortion of the mediastinal contours and reduced diagnostic sensitivity and specificity for mediastinal pathology. Aortic atherosclerosis. IMPRESSION: 1. Mild interstitial pulmonary edema and small left pleural effusion in the setting of mild cardiomegaly; imaging findings concerning for congestive heart failure. 2. Aortic atherosclerosis. 3. Postoperative changes and support apparatus, as above. Electronically Signed   By: Vinnie Langton M.D.   On: 07/07/2018 11:54   Ct Head Wo Contrast  Result Date: 07/11/2018 CLINICAL DATA:  Initial evaluation for acute altered mental status, asymmetric left-sided weakness with left facial droop. EXAM: CT HEAD WITHOUT CONTRAST TECHNIQUE: Contiguous axial images were obtained from the base of the skull through the vertex without intravenous contrast. COMPARISON:  Prior CT from 07/06/2018. FINDINGS: Brain: Age advanced cerebral atrophy with chronic microvascular ischemic disease, stable. Remote right MCA and/or MCA/PCA watershed territory infarct involving the right temporal occipital region noted, stable. Multiple chronic lacunar infarcts seen involving the bilateral basal ganglia, thalami, pons, and cerebellum. Specifically,  previously noted right basal ganglia lacunar infarcts are stable from previous exam, likely chronic. No acute intracranial hemorrhage. No new or evolving acute large vessel territory infarct. No mass lesion, midline shift or mass effect. No hydrocephalus. No extra-axial fluid collection. Vascular: No hyperdense vessel. Calcified atherosclerosis at the skull base. Skull: Scalp soft tissues and calvarium within normal limits. Sinuses/Orbits: Globes and orbital soft tissues within normal limits. Scattered mucosal thickening with superimposed air-fluid levels noted within the paranasal sinuses, worsened from previous. Mastoid air cells are clear. Other: None. IMPRESSION: 1. Stable head CT, with no new acute intracranial abnormality. 2. In band cerebral atrophy with chronic small vessel ischemic disease with multiple chronic ischemic infarcts as above. 3. Acute pan sinusitis, worsened from previous. Electronically Signed   By: Jeannine Boga M.D.   On: 07/11/2018 17:20   Ct Head Wo Contrast  Result Date: 07/06/2018 CLINICAL DATA:  Altered mental status. History of HIV, hypertension, hypercholesterolemia and diabetes. EXAM: CT HEAD WITHOUT CONTRAST TECHNIQUE: Contiguous axial images were obtained from the base of the skull through the vertex without intravenous contrast. COMPARISON:  CT HEAD July 21, 2016 FINDINGS: BRAIN: No intraparenchymal hemorrhage, mass effect, midline shift or acute large vascular territory infarcts. Old small RIGHT cerebellar and pontine infarcts. RIGHT temporal occipital encephalomalacia. New subcentimeter RIGHT internal capsule versus basal ganglia hypodensities. Old RIGHT basal ganglia infarct with mild ex vacuo dilatation RIGHT lateral ventricle. Old small bilateral thalami and LEFT basal ganglia infarcts. Confluent supratentorial white matter hypodensities. Moderate parenchymal brain volume loss. No hydrocephalus. VASCULAR: Moderate calcific atherosclerosis of the carotid siphons.  SKULL: No skull fracture. No significant scalp soft tissue swelling. SINUSES/ORBITS: Mild paranasal sinus  mucosal thickening. Chronic dehiscence of the RIGHT posterolateral maxillary wall. Mastoid air cells are well aerated.The included ocular globes and orbital contents are non-suspicious. OTHER: Bilateral parotid sialoliths. IMPRESSION: 1. New age indeterminate RIGHT basal ganglia small infarcts. 2. Old RIGHT temporal occipital/PCA territory infarct. 3. Old basal ganglia, thalami, pontine and cerebellar small infarcts. Moderate to severe chronic small vessel ischemic changes. 4. Moderate parenchymal brain volume loss, advanced for age. Electronically Signed   By: Elon Alas M.D.   On: 07/06/2018 22:16   US Renal  Result Date: 07/07/2018 CLINICAL DATA:  Acute kidney injury, history cardiomyopathy, chronic systolic heart failure, stage III chronic kidney disease, hypertension, COPD, former smoker EXAM: RENAL / URINARY TRACT ULTRASOUND COMPLETE COMPARISON:  09/05/2016 FINDINGS: Right Kidney: Renal measurements: 10.9.7 x 6.1 cm = volume: 200 mL. Cortical thinning. Increased cortical echogenicity. Tiny mid renal cyst 8 x 6.9 mm. Additional peripelvic cyst 3.0 x 3.2 x 3.1 cm, simple features. No additional mass or hydronephrosis. No shadowing calculi. Left Kidney: Renal measurements: 12.3 x 6.6 x 6.6 cm = volume: 276 mL. Cortical thinning. Increased cortical echogenicity. Small cyst at inferior pole 1.0 x 0.9 x 1.2 cm. Additional small cyst at inferior pole 2.0 x 1.6 x 1.9 cm, simple features. No additional mass, hydronephrosis, or shadowing calcification. Bladder: Decompressed, unable to evaluate. IMPRESSION: Medical renal disease changes of both kidneys. Small renal cysts in both kidneys. No evidence of hydronephrosis. Electronically Signed   By: Lavonia Dana M.D.   On: 07/07/2018 19:30   Ir Fluoro Guide Cv Line Right  Result Date: 07/09/2018 INDICATION: Acute uremia, acute kidney injury, no current  access for dialysis EXAM: Ultrasound guidance for vascular access Right IJ temporary dialysis catheter (Mahurkar catheter) MEDICATIONS: 1% lidocaine local ANESTHESIA/SEDATION: Moderate Sedation Time: None. The patient's level of consciousness and vital signs were monitored continuously by radiology nursing throughout the procedure under my direct supervision. FLUOROSCOPY TIME:  Fluoroscopy Time: 0 minutes 24 seconds (2 mGy). COMPLICATIONS: None immediate. PROCEDURE: Emergency consent was obtained from the patient's physician after a thorough discussion of the procedural risks, benefits and alternatives. All questions were addressed. Maximal Sterile Barrier Technique was utilized including caps, mask, sterile gowns, sterile gloves, sterile drape, hand hygiene and skin antiseptic. A timeout was performed prior to the initiation of the procedure. Under sterile conditions and local anesthesia, ultrasound micropuncture access performed of the right internal jugular vein. Images obtained for documentation of the patent jugular vein. Guidewire advanced easily. Measurements obtained. Four [place] dilator advanced. Amplatz guidewire advanced into the IVC. Tract dilatation performed to insert a temporary dialysis catheter. Tip position at the SVC RA junction. Images obtained for documentation. Catheter secured Prolene sutures. Blood aspirated easily followed by saline and heparin flushes. Appropriate volume and strength of heparin instilled in all lumens. External caps applied. Catheter secured with Prolene sutures and a sterile dressing. No immediate complication. Patient tolerated the procedure well. IMPRESSION: Successful ultrasound and fluoroscopic right IJ temporary dialysis catheter. Tip SVC RA junction. Ready for use. Electronically Signed   By: Jerilynn Mages.  Shick M.D.   On: 07/09/2018 13:06   Ir US Guide Vasc Access Right  Result Date: 07/09/2018 INDICATION: Acute uremia, acute kidney injury, no current access for dialysis  EXAM: Ultrasound guidance for vascular access Right IJ temporary dialysis catheter (Mahurkar catheter) MEDICATIONS: 1% lidocaine local ANESTHESIA/SEDATION: Moderate Sedation Time: None. The patient's level of consciousness and vital signs were monitored continuously by radiology nursing throughout the procedure under my direct supervision. FLUOROSCOPY TIME:  Fluoroscopy Time:  0 minutes 24 seconds (2 mGy). COMPLICATIONS: None immediate. PROCEDURE: Emergency consent was obtained from the patient's physician after a thorough discussion of the procedural risks, benefits and alternatives. All questions were addressed. Maximal Sterile Barrier Technique was utilized including caps, mask, sterile gowns, sterile gloves, sterile drape, hand hygiene and skin antiseptic. A timeout was performed prior to the initiation of the procedure. Under sterile conditions and local anesthesia, ultrasound micropuncture access performed of the right internal jugular vein. Images obtained for documentation of the patent jugular vein. Guidewire advanced easily. Measurements obtained. Four [place] dilator advanced. Amplatz guidewire advanced into the IVC. Tract dilatation performed to insert a temporary dialysis catheter. Tip position at the SVC RA junction. Images obtained for documentation. Catheter secured Prolene sutures. Blood aspirated easily followed by saline and heparin flushes. Appropriate volume and strength of heparin instilled in all lumens. External caps applied. Catheter secured with Prolene sutures and a sterile dressing. No immediate complication. Patient tolerated the procedure well. IMPRESSION: Successful ultrasound and fluoroscopic right IJ temporary dialysis catheter. Tip SVC RA junction. Ready for use. Electronically Signed   By: Jerilynn Mages.  Shick M.D.   On: 07/09/2018 13:06   Dg Chest Port 1 View  Result Date: 07/20/2018 CLINICAL DATA:  Placement of right IJ dialysis catheter. EXAM: PORTABLE CHEST 1 VIEW COMPARISON:   07/12/2010 FINDINGS: The prior IJ catheter was removed. There is a new double lumen dialysis catheter in place. The distal tip is in the region of the right atrium and the proximal tip is likely near the cavoatrial junction. No complicating features. The right ventricular pacer wire is stable. Stable surgical changes from bypass surgery. The heart remains enlarged. There is tortuosity and calcification of the thoracic aorta. Mild vascular congestion without overt pulmonary edema. No pleural effusions or pneumothorax. IMPRESSION: Right IJ dialysis catheter in good position without complicating features. Cardiac enlargement and vascular congestion without overt pulmonary edema. Electronically Signed   By: Marijo Sanes M.D.   On: 07/20/2018 10:53   Dg Chest Port 1 View  Result Date: 07/12/2018 CLINICAL DATA:  Wheezing EXAM: PORTABLE CHEST 1 VIEW COMPARISON:  07/11/2018 FINDINGS: Right IJ approach hemodialysis catheter tip is in the right atrium. Unchanged position of left chest wall single lead AICD. Mild interstitial pulmonary edema. Mild cardiomegaly with findings of prior CABG. Trace left pleural effusion. IMPRESSION: Mild cardiomegaly and mild interstitial pulmonary edema. Electronically Signed   By: Ulyses Jarred M.D.   On: 07/12/2018 04:32   Dg Chest Port 1 View  Result Date: 07/11/2018 CLINICAL DATA:  Shortness of breath EXAM: PORTABLE CHEST 1 VIEW COMPARISON:  07/07/2018 FINDINGS: Left chest wall AICD lead is in unchanged position. There is mild cardiomegaly. Central pulmonary vascular congestion without overt pulmonary edema. No focal airspace consolidation. Small left pleural effusion. No pneumothorax. IMPRESSION: Cardiomegaly and small left pleural effusion without overt pulmonary edema. Electronically Signed   By: Ulyses Jarred M.D.   On: 07/11/2018 03:35   Dg Chest Port 1 View  Result Date: 07/06/2018 CLINICAL DATA:  Sepsis EXAM: PORTABLE CHEST 1 VIEW COMPARISON:  Portable exam 2023 hours  compared to 12/09/2016 FINDINGS: LEFT subclavian AICD with lead projecting over RIGHT ventricle. External pacing lead present. Enlargement of cardiac silhouette with postsurgical changes of CABG. Atherosclerotic calcification aorta. Mediastinal contours normal. Subsegmental atelectasis LEFT lower lobe. Pulmonary vascular congestion with interstitial prominence at the mid to lower lungs which could represent minimal pulmonary edema. No pleural effusion or pneumothorax. Bones demineralized. IMPRESSION: Enlargement of cardiac silhouette with vascular congestion  and question minimal pulmonary edema. Electronically Signed   By: Lavonia Dana M.D.   On: 07/06/2018 20:35   Dg Swallowing Func-speech Pathology  Result Date: 07/14/2018 Objective Swallowing Evaluation: Type of Study: MBS-Modified Barium Swallow Study  Patient Details Name: ALLARD LIGHTSEY MRN: [id number] Date of Birth: [birth year] Today's Date: 07/14/2018 Time: SLP Start Time (ACUTE ONLY): 65 -SLP Stop Time (ACUTE ONLY): 1150 SLP Time Calculation (min) (ACUTE ONLY): 20 min Past Medical History: Past Medical History: Diagnosis Date . AICD (automatic cardioverter/defibrillator) present  . Alcoholism in remission ([place])  . Anemia  . Aortic insufficiency  . Arthritis  . At moderate risk for fall  . Bell's palsy  . Chronic systolic heart failure (HCC)   NYHA Class III . CKD (chronic kidney disease), stage IV ([place])  . COPD (chronic obstructive pulmonary disease) ([place])  . Essential hypertension, benign  . HIV disease ([place]) 09/06/2016 . Hyperlipidemia  . NICM (nonischemic cardiomyopathy) ([place])  . Noncompliance with medication regimen  . NSVT (nonsustained ventricular tachycardia) ([place])  . Numbness of right jaw   Had a stoke in 01/2013. Numbness is occasional, especially when trying to chew. . OSA (obstructive sleep apnea)  . Productive cough 08/2013  With brown sputum  . S/P aortic valve replacement with bioprosthetic valve  . Stroke ([place]) 01/2013  weakness of right side  from CVA . Type 2 diabetes mellitus ([place])  . Uses hearing aid 2014  recently received new hearing aids Past Surgical History: Past Surgical History: Procedure Laterality Date . BIOPSY N/A 04/10/2014  Procedure: GASTRIC BIOPSY;  Surgeon: Daneil Dolin, MD;  Location: [facility];  Service: Endoscopy;  Laterality: N/A; . BIOPSY  10/12/2015  Procedure: BIOPSY;  Surgeon: Daneil Dolin, MD;  Location: [facility];  Service: Endoscopy;;  stomach bx's . CARDIAC DEFIBRILLATOR PLACEMENT  11/12/12  [employer] Inogen MINI ICD implanted in [place] at [place] [facility] per Dr Nelly Laurence note . COLONOSCOPY WITH PROPOFOL N/A 04/10/2014  RMR: Colonic Diverticulosis . ESOPHAGOGASTRODUODENOSCOPY (EGD) WITH PROPOFOL N/A 04/10/2014  RMR: Mild erosive reflux esophagitis. Multiple antral polyps likely hyperplastic status post removal by hot snare cautery technique. Diffusely abnormal stomach status post gastric biopsy I suspect some of patients anemaia may be due to intermittent oozing from the stomach. It would be difficult and a risky proposition to attempt complete removal of all of his gastric polyps. . ESOPHAGOGASTRODUODENOSCOPY (EGD) WITH PROPOFOL N/A 10/12/2015  Procedure: ESOPHAGOGASTRODUODENOSCOPY (EGD) WITH PROPOFOL;  Surgeon: Daneil Dolin, MD;  Location: [facility];  Service: Endoscopy;  Laterality: N/A;  4098 - moved to 9:15 . IR FLUORO GUIDE CV LINE RIGHT  07/09/2018 . IR US GUIDE VASC ACCESS RIGHT  07/09/2018 . POLYPECTOMY N/A 04/10/2014  Procedure: GASTRIC POLYPECTOMY;  Surgeon: Daneil Dolin, MD;  Location: [facility];  Service: Endoscopy;  Laterality: N/A; . RIGHT HEART CATHETERIZATION N/A 02/06/2014  Procedure: RIGHT HEART CATH;  Surgeon: Larey Dresser, MD;  Location: [facility] CATH LAB;  Service: Cardiovascular;  Laterality: N/A; HPI: PHINEHAS GROUNDS is a 68 y.o. male with PMH significant of HIV, type 2 diabetes, COPD, CVA (11914, HTN, Bells Palsy, systolic CHF, CKD, and history  of AICD placement followed by cardiology.  Pt was admitted to [facility] ED 07/07/18 and after presenting with AMS at SNF residence, vomitted, and became increasingly unresponsive. Per PA note, pt was found to be encephalopathic secondary to sepsis and renal failure. Pt seen by ST in Jan 2018; BSE recc  D2/thin. CXR showed cardiomegaly and small left pleural effusion. Head CT revealed new age indeterminate RIGHT basal ganglia small infarcts, old RIGHT temporal occipital/PCA territory infarct, old basal ganglia, thalami, pontine and cerebellar small infarcts.  Subjective: alert, cooperative Assessment / Plan / Recommendation CHL IP CLINICAL IMPRESSIONS 07/14/2018 Clinical Impression Pt presents with primary moderate oral dysphagia, mild pharyngeal dysphagia. There is lingual pumping, decreased bolus cohesion, piecemeal deglutition and premature spillage (to pyriforms with liquids, valleculae with solids). Premature spillage results in intermittent deep larygneal penetration of thin, nectar, and honey thick liquids, with aspiration (sensed and silent) of thin and nectar. Due to impulsivity pt at risk for aspiration with liquids of any consistency. With solids, pt without rotary chew, lingual thrusting noted with poor cohesion and spillage of solids to the valleculae. SLP used straw to facilitate chin tuck, which prevented aspiration with thin liquids, including small and large sips and consecutive boluses. There was trace shallow penetration with chin tuck which reversed with swallow. Brief stasis of barium tablet in mid-thoracic esophagus, cleared pt's GE junction with liquid wash. Recommend dys 2, thin liquids, meds whole in puree; pt must tuck chin when swallowing for airway protection. Recommend full supervision due to level of cuing necessary. SLP will follow for tolerance and training in swallow precautions/compensations.  SLP Visit Diagnosis Dysphagia, oral phase (R13.11);Dysphagia, pharyngeal phase (R13.13) Attention and  concentration deficit following -- Frontal lobe and executive function deficit following -- Impact on safety and function Moderate aspiration risk   CHL IP TREATMENT RECOMMENDATION 07/14/2018 Treatment Recommendations Therapy as outlined in treatment plan below   Prognosis 07/14/2018 Prognosis for Safe Diet Advancement Good Barriers to Reach Goals Cognitive deficits Barriers/Prognosis Comment -- CHL IP DIET RECOMMENDATION 07/14/2018 SLP Diet Recommendations Dysphagia 2 (Fine chop) solids;Thin liquid Liquid Administration via Straw Medication Administration Whole meds with puree Compensations Slow rate;Small sips/bites;Chin tuck;Clear throat intermittently;Use straw to facilitate chin tuck Postural Changes --   CHL IP OTHER RECOMMENDATIONS 07/14/2018 Recommended Consults -- Oral Care Recommendations Oral care BID Other Recommendations --   CHL IP FOLLOW UP RECOMMENDATIONS 07/14/2018 Follow up Recommendations Skilled Nursing facility   [facility] IP FREQUENCY AND DURATION 07/14/2018 Speech Therapy Frequency (ACUTE ONLY) min 2x/week Treatment Duration 2 weeks      CHL IP ORAL PHASE 07/14/2018 Oral Phase Impaired Oral - Pudding Teaspoon -- Oral - Pudding Cup -- Oral - Honey Teaspoon NT Oral - Honey Cup Weak lingual manipulation;Lingual pumping;Reduced posterior propulsion;Piecemeal swallowing;Decreased bolus cohesion;Premature spillage Oral - Nectar Teaspoon Weak lingual manipulation;Lingual pumping;Reduced posterior propulsion;Piecemeal swallowing;Decreased bolus cohesion;Premature spillage Oral - Nectar Cup Weak lingual manipulation;Lingual pumping;Reduced posterior propulsion;Piecemeal swallowing;Decreased bolus cohesion;Premature spillage Oral - Nectar Straw Weak lingual manipulation;Lingual pumping;Reduced posterior propulsion;Piecemeal swallowing;Delayed oral transit;Decreased bolus cohesion Oral - Thin Teaspoon Lingual pumping;Reduced posterior propulsion;Piecemeal swallowing;Decreased bolus cohesion;Premature spillage Oral -  Thin Cup Weak lingual manipulation;Lingual pumping;Reduced posterior propulsion;Piecemeal swallowing;Decreased bolus cohesion;Premature spillage Oral - Thin Straw Weak lingual manipulation;Reduced posterior propulsion;Piecemeal swallowing;Decreased bolus cohesion;Premature spillage Oral - Puree Weak lingual manipulation;Lingual pumping;Reduced posterior propulsion;Piecemeal swallowing;Decreased bolus cohesion;Premature spillage Oral - Mech Soft Impaired mastication;Weak lingual manipulation;Lingual pumping;Reduced posterior propulsion;Lingual/palatal residue;Piecemeal swallowing;Delayed oral transit;Premature spillage;Decreased bolus cohesion Oral - Regular -- Oral - Multi-Consistency -- Oral - Pill Other (Comment) Oral Phase - Comment --  CHL IP PHARYNGEAL PHASE 07/14/2018 Pharyngeal Phase Impaired Pharyngeal- Pudding Teaspoon -- Pharyngeal -- Pharyngeal- Pudding Cup -- Pharyngeal -- Pharyngeal- Honey Teaspoon -- Pharyngeal -- Pharyngeal- Honey Cup Penetration/Aspiration before swallow;Penetration/Aspiration during swallow Pharyngeal Material enters airway, remains ABOVE vocal cords then ejected  out;Material enters airway, remains ABOVE vocal cords and not ejected out Pharyngeal- Nectar Teaspoon -- Pharyngeal -- Pharyngeal- Nectar Cup Penetration/Aspiration before swallow;Penetration/Aspiration during swallow;Trace aspiration Pharyngeal Material does not enter airway;Material enters airway, CONTACTS cords and not ejected out;Material enters airway, passes BELOW cords without attempt by patient to eject out (silent aspiration) Pharyngeal- Nectar Straw Penetration/Aspiration before swallow;Penetration/Aspiration during swallow;Trace aspiration Pharyngeal Material enters airway, CONTACTS cords and not ejected out;Material enters airway, passes BELOW cords without attempt by patient to eject out (silent aspiration) Pharyngeal- Thin Teaspoon Penetration/Aspiration before swallow;Penetration/Aspiration during  swallow;Moderate aspiration Pharyngeal Material enters airway, passes BELOW cords and not ejected out despite cough attempt by patient Pharyngeal- Thin Cup Penetration/Aspiration before swallow;Penetration/Aspiration during swallow;Moderate aspiration Pharyngeal Material does not enter airway;Material enters airway, CONTACTS cords and not ejected out;Material enters airway, passes BELOW cords and not ejected out despite cough attempt by patient;Material enters airway, passes BELOW cords without attempt by patient to eject out (silent aspiration) Pharyngeal- Thin Straw Penetration/Aspiration during swallow;Other (Comment) Pharyngeal Material does not enter airway;Material enters airway, remains ABOVE vocal cords then ejected out Pharyngeal- Puree WFL Pharyngeal -- Pharyngeal- Mechanical Soft WFL Pharyngeal -- Pharyngeal- Regular -- Pharyngeal -- Pharyngeal- Multi-consistency -- Pharyngeal -- Pharyngeal- Pill Other (Comment) Pharyngeal -- Pharyngeal Comment --  CHL IP CERVICAL ESOPHAGEAL PHASE 07/14/2018 Cervical Esophageal Phase WFL Pudding Teaspoon -- Pudding Cup -- Honey Teaspoon -- Honey Cup -- Nectar Teaspoon -- Nectar Cup -- Nectar Straw -- Thin Teaspoon -- Thin Cup -- Thin Straw -- Puree -- Mechanical Soft -- Regular -- Multi-consistency -- Pill -- Cervical Esophageal Comment -- Deneise Lever, MS, CCC-SLP Speech-Language Pathologist Acute Rehabilitation Services Pager: [phone] Office: [phone] Aliene Altes 07/14/2018, 1:46 PM              Dg Cyndy Freeze Guide Cv Line-no Report  Result Date: 07/20/2018 Fluoroscopy was utilized by the requesting physician.  No radiographic interpretation.   Vas [place] Upper Ext Vein Mapping (pre-op Avf)  Result Date: 07/18/2018 UPPER EXTREMITY VEIN MAPPING  Indications: Pre-access. Performing Technologist: Abram Sander RVS  Examination Guidelines: A complete evaluation includes B-mode imaging, spectral Doppler, color Doppler, and power Doppler as needed of all  accessible portions of each vessel. Bilateral testing is considered an integral part of a complete examination. Limited examinations for reoccurring indications may be performed as noted. +-----------------+-------------+----------+--------------+ Right Cephalic   Diameter (cm)Depth (cm)   Findings    +-----------------+-------------+----------+--------------+ Shoulder             0.50        1.23                  +-----------------+-------------+----------+--------------+ Prox upper arm       0.55        0.84                  +-----------------+-------------+----------+--------------+ Mid upper arm        0.53        0.27                  +-----------------+-------------+----------+--------------+ Dist upper arm       0.41        0.41                  +-----------------+-------------+----------+--------------+ Antecubital fossa    0.50        0.29                  +-----------------+-------------+----------+--------------+ Prox forearm  0.24        0.22     branching    +-----------------+-------------+----------+--------------+ Mid forearm                             not visualized +-----------------+-------------+----------+--------------+ Dist forearm                            not visualized +-----------------+-------------+----------+--------------+ +-----------------+-------------+----------+--------+ Right Basilic    Diameter (cm)Depth (cm)Findings +-----------------+-------------+----------+--------+ Prox upper arm       0.36        1.36            +-----------------+-------------+----------+--------+ Mid upper arm        0.44        1.66            +-----------------+-------------+----------+--------+ Dist upper arm       0.56        0.95            +-----------------+-------------+----------+--------+ Antecubital fossa    0.64        0.50            +-----------------+-------------+----------+--------+ Prox forearm         0.29         0.21            +-----------------+-------------+----------+--------+ Mid forearm          0.32        0.20            +-----------------+-------------+----------+--------+ Distal forearm       0.30        0.20            +-----------------+-------------+----------+--------+ +-----------------+-------------+----------+---------+ Left Cephalic    Diameter (cm)Depth (cm)Findings  +-----------------+-------------+----------+---------+ Shoulder             0.40        0.98             +-----------------+-------------+----------+---------+ Prox upper arm       0.43        0.62             +-----------------+-------------+----------+---------+ Mid upper arm        0.41        0.36             +-----------------+-------------+----------+---------+ Dist upper arm       0.39        0.37             +-----------------+-------------+----------+---------+ Antecubital fossa    0.50        0.34   branching +-----------------+-------------+----------+---------+ Prox forearm         0.31        0.18             +-----------------+-------------+----------+---------+ Mid forearm          0.29        0.19             +-----------------+-------------+----------+---------+ Dist forearm         0.29        0.17             +-----------------+-------------+----------+---------+ +-----------------+-------------+----------+--------------+ Left Basilic     Diameter (cm)Depth (cm)   Findings    +-----------------+-------------+----------+--------------+ Prox upper arm       0.70        1.48                  +-----------------+-------------+----------+--------------+  Mid upper arm        0.57        1.30     branching    +-----------------+-------------+----------+--------------+ Dist upper arm                          not visualized +-----------------+-------------+----------+--------------+ Antecubital fossa                       not visualized  +-----------------+-------------+----------+--------------+ Prox forearm                            not visualized +-----------------+-------------+----------+--------------+ Mid forearm                             not visualized +-----------------+-------------+----------+--------------+ Distal forearm                          not visualized +-----------------+-------------+----------+--------------+ *See table(s) above for measurements and observations.  Diagnosing physician: Servando Snare MD Electronically signed by Servando Snare MD on 07/18/2018 at 5:32:40 PM.    Final    Dg Fluoro Guide Lumbar Puncture  Result Date: 07/10/2018 CLINICAL DATA:  68 year old male with encephalopathy. Subsequent encounter. EXAM: DIAGNOSTIC LUMBAR PUNCTURE UNDER FLUOROSCOPIC GUIDANCE FLUOROSCOPY TIME:  Fluoroscopy Time:  6 seconds PROCEDURE: Patient was not able to give consent. Family (sister) not available by phone. Emergency request/consent from Dr. Candiss Norse. Labs and head CT reviewed. Under fluoroscopic guidance and aseptic technique (utilizing 1% lidocaine as local anesthetic), L3-4 lumbar puncture was performed with a single pass of a 22 gauge spinal needle. Opening pressure 26 and closing pressure 20 cc water (not felt to be accurate as patient was lying on the stomach and moving throughout the exam). 8.5 cc of clear cerebral spinal fluid collected and sent for labs. Patient tolerated procedure well without any difficulty. IMPRESSION: Successful L3-4 lumbar puncture as noted above. Electronically Signed   By: Genia Del M.D.   On: 07/10/2018 15:12     LOS: 13 days   Oren Binet, MD  [facility]  If 7PM-7AM, please contact night-coverage  Please page via [email or web address]  Go to [email or web address] and use [facility]'s universal password to access. If you do not have the password, please contact the hospital operator.  Locate the [facility] provider you are looking for under [facility] and page to a  number that you can be directly reached. If you still have difficulty reaching the provider, please page the [facility] (Director on Call) for the Hospitalists listed on amion for assistance.  07/20/2018, 2:40 PM

## 2018-07-21 ENCOUNTER — Other Ambulatory Visit: Payer: Self-pay

## 2018-07-21 LAB — CBC
HCT: 39.1 % (ref 39.0–52.0)
Hemoglobin: 12.4 g/dL — ABNORMAL LOW (ref 13.0–17.0)
MCH: 28.8 pg (ref 26.0–34.0)
MCHC: 31.7 g/dL (ref 30.0–36.0)
MCV: 90.7 fL (ref 80.0–100.0)
Platelets: 117 10*3/uL — ABNORMAL LOW (ref 150–400)
RBC: 4.31 MIL/uL (ref 4.22–5.81)
RDW: 14.6 % (ref 11.5–15.5)
WBC: 5.1 10*3/uL (ref 4.0–10.5)
nRBC: 0 % (ref 0.0–0.2)

## 2018-07-21 LAB — RENAL FUNCTION PANEL
Albumin: 2.1 g/dL — ABNORMAL LOW (ref 3.5–5.0)
Anion gap: 10 (ref 5–15)
BUN: 33 mg/dL — ABNORMAL HIGH (ref 8–23)
CO2: 27 mmol/L (ref 22–32)
Calcium: 7 mg/dL — ABNORMAL LOW (ref 8.9–10.3)
Chloride: 101 mmol/L (ref 98–111)
Creatinine, Ser: 3.91 mg/dL — ABNORMAL HIGH (ref 0.61–1.24)
GFR calc Af Amer: 17 mL/min — ABNORMAL LOW (ref >60)
GFR calc non Af Amer: 15 mL/min — ABNORMAL LOW (ref >60)
Glucose, Bld: 90 mg/dL (ref 70–99)
Phosphorus: 3.4 mg/dL (ref 2.5–4.6)
Potassium: 3.9 mmol/L (ref 3.5–5.1)
Sodium: 138 mmol/L (ref 135–145)

## 2018-07-21 LAB — GLUCOSE, CAPILLARY
Glucose-Capillary: 78 mg/dL (ref 70–99)
Glucose-Capillary: 259 mg/dL — ABNORMAL HIGH (ref 70–99)
Glucose-Capillary: 299 mg/dL — ABNORMAL HIGH (ref 70–99)

## 2018-07-21 LAB — PROTIME-INR
INR: 1.17
Prothrombin Time: 14.8 seconds (ref 11.4–15.2)

## 2018-07-21 MED ORDER — WARFARIN SODIUM 5 MG PO TABS
5.0000 mg | ORAL_TABLET | Freq: Once | ORAL | Status: AC
  Administered 2018-07-21: 5 mg via ORAL
  Filled 2018-07-21: qty 1

## 2018-07-21 NOTE — Progress Notes (Signed)
ANTICOAGULATION CONSULT NOTE - Follow Up Consult  Pharmacy Consult for Coumadin Indication: atrial fibrillation  No Known Allergies  Patient Measurements: Height: 5\' 3"  (160 cm) Weight: 154 lb 15.7 oz (70.3 kg) IBW/kg (Calculated) : 56.9  Vital Signs: Temp: 98.4 F (36.9 C) (02/22 0518) Temp Source: Oral (02/22 0518) BP: 118/81 (02/22 0518) Pulse Rate: 89 (02/22 0518)  Labs: Recent Labs    07/19/18 0500 07/19/18 1148 07/20/18 0400 07/21/18 0426  HGB  --   --  11.7* 12.4*  HCT  --   --  35.8* 39.1  PLT  --   --  156 117*  LABPROT 14.8  --  15.9* 14.8  INR 1.17  --  1.28 1.17  CREATININE  --  4.61* 4.95* 3.91*    Estimated Creatinine Clearance: 16.4 mL/min (A) (by C-G formula based on SCr of 3.91 mg/dL (H)).  Assessment: Anticoag:  None PTA. s/p IV heparin for new onset AFib with RVR, CHA2DS2-Vasc score 5 >>stopped due to thrombocytopenia. Coumadin started 2/18. Hgb 11.7. Plts 156 rising. INR 1.28 with Coum on hold 2/20 for HD catheter replacement. Restarted yesterday at 5mg  x1  2/22 AM: INR this morning subtherapeutic at 1.17 after 5mg  dose yesterday, due to holding dose on 2/21. CBC stable and no bleeding noted.  Goal of Therapy:  INR 2-3 Monitor platelets by anticoagulation protocol: Yes   Plan:  - Continue Coumadin 5mg  po x 1, Daily INR - f/u to teach patient when/if encephalopathy improved  Thank you for involving pharmacy in this patient's care.  Janae Bridgeman, PharmD PGY1 Pharmacy Resident Phone: [phone] 07/21/2018 8:47 AM

## 2018-07-21 NOTE — Progress Notes (Signed)
[facility]   SUBJECTIVE:    No interval events  Patient without complaints  0.8 L urine output yesterday  HD yesterday, uneventful, nearly 2 L ultrafiltration, post weight 70.3 kg   OBJECTIVE:  Vitals:   07/21/18 0518 07/21/18 0925  BP: 118/81   Pulse: 89   Resp: 18   Temp: 98.4 F (36.9 C)   SpO2: 97% 92%    Intake/Output Summary (Last 24 hours) at 07/21/2018 1156 Last data filed at 07/21/2018 0930 Gross per 24 hour  Intake 120 ml  Output 2448 ml  Net -2328 ml      Genearl: comfortably laying at 30 deg, answers simple questions. Neck:  RIJ tunneled cath c/d/i CV:  RRR  No rub Lungs CTAB Abd:  abd SNT/ND with normal BS GU:  Bladder non-palpable, Foley catheter in place Extremities:  No LE edema. Skin:  No skin rash Left upper arm AVG +B/T   MEDICATIONS:  . bictegravir-emtricitabine-tenofovir AF  1 tablet Oral Daily  . Chlorhexidine Gluconate Cloth  6 each Topical Q0600  . feeding supplement (ENSURE ENLIVE)  237 mL Oral BID BM  . insulin aspart  0-9 Units Subcutaneous TID WC  . mouth rinse  15 mL Mouth Rinse BID  . metoprolol succinate  25 mg Oral Daily  . mometasone-formoterol  2 puff Inhalation BID  . multivitamin  1 tablet Oral QHS  . predniSONE  40 mg Oral Q breakfast  . warfarin  5 mg Oral ONCE-1800  . warfarin   Does not apply Once  . Warfarin - Pharmacist Dosing Inpatient   Does not apply q1800       LABS:   CBC Latest Ref Rng & Units 07/21/2018 07/20/2018 07/18/2018  WBC 4.0 - 10.5 K/uL 5.1 5.2 6.9  Hemoglobin 13.0 - 17.0 g/dL 12.4(L) 11.7(L) 12.2(L)  Hematocrit 39.0 - 52.0 % 39.1 35.8(L) 38.7(L)  Platelets 150 - 400 K/uL 117(L) 156 127(L)    CMP Latest Ref Rng & Units 07/21/2018 07/20/2018 07/19/2018  Glucose 70 - 99 mg/dL 90 221(H) 113(H)  BUN 8 - 23 mg/dL 33(H) 65(H) 59(H)  Creatinine 0.61 - 1.24 mg/dL 3.91(H) 4.95(H) 4.61(H)  Sodium 135 - 145 mmol/L 138 138 140  Potassium 3.5 - 5.1 mmol/L 3.9 4.0 3.3(L)  Chloride 98 - 111  mmol/L 101 104 106  CO2 22 - 32 mmol/L 27 24 24   Calcium 8.9 - 10.3 mg/dL 7.0(L) 7.0(L) 6.8(L)  Total Protein 6.5 - 8.1 g/dL - - -  Total Bilirubin 0.3 - 1.2 mg/dL - - -  Alkaline Phos 38 - 126 U/L - - -  AST 15 - 41 U/L - - -  ALT 0 - 44 U/L - - -    Lab Results  Component Value Date   PTH 52 04/17/2018   CALCIUM 7.0 (L) 07/21/2018   CAION 1.27 (H) 11/28/2015   PHOS 3.4 07/21/2018       Component Value Date/Time   COLORURINE AMBER (A) 07/08/2018 0839   APPEARANCEUR CLOUDY (A) 07/08/2018 0839   LABSPEC 1.016 07/08/2018 0839   PHURINE 5.0 07/08/2018 0839   GLUCOSEU NEGATIVE 07/08/2018 0839   HGBUR SMALL (A) 07/08/2018 0839   BILIRUBINUR NEGATIVE 07/08/2018 0839   KETONESUR 5 (A) 07/08/2018 0839   PROTEINUR 100 (A) 07/08/2018 0839   UROBILINOGEN 0.2 10/23/2013 1545   NITRITE NEGATIVE 07/08/2018 0839   LEUKOCYTESUR LARGE (A) 07/08/2018 0839      Component Value Date/Time   PHART 7.346 (L) 07/08/2018 1235   PCO2ART  32.0 07/08/2018 1235   PO2ART 86.4 07/08/2018 1235   HCO3 17.0 (L) 07/08/2018 1235   TCO2 25 11/28/2015 1304   ACIDBASEDEF 7.5 (H) 07/08/2018 1235   O2SAT 95.9 07/08/2018 1235       Component Value Date/Time   IRON 40 (L) 04/17/2018 0734   TIBC 260 04/17/2018 0734   FERRITIN 39 04/17/2018 0734   IRONPCTSAT 15 (L) 04/17/2018 0734       ASSESSMENT/PLAN:      1.  Acute on chronic stage IV renal disease; probable baseline renal function 2.4-2.8 and etiology is hypertension, diabetes.   Acute insult likely ATN during E. coli sepsis.  Started HD 2/10.  Remains dialysis dependent.  No clear evidence of recovery. S/p R IJ TDC and LUE AVG 07/20/18.  HD today: 3K, 3.5h, NO heparin; then watch over weekend.  Clip as AKI in process.  2.  Sepsis secondary to E coli in urine and blood.  Continue antibiotics per ID - plan 2 wk course.  TEE Ok.  CT A/P showed diverticula.  Blood cultures negative from 2/10  3.  Metabolic encephalopathy.  Likely secondary to sepsis  complicated by uremia.  LP not c/w infection. Is awake and alert but not very aware of current circumstances, not sure his baseline prior to admission.   4.  Hyperkalemia.  resolved  5.  Bioprosthetic AVR, Chronic dual HF with EF , A fib with RVR:  Cardiology following.  Anticoagulation on hold in light of thrombocytopenia, rec start after patient has permanent access  7. HIV: on biktarvy   Pearson Grippe MD

## 2018-07-21 NOTE — Progress Notes (Signed)
   VASCULAR SURGERY ASSESSMENT & PLAN:   1 Day Post-Op s/p: New left forearm AV graft.  The graft has an excellent thrill.   His incisions are healing nicely.  Vascular surgery will be available as needed.  SUBJECTIVE:   No complaints.  PHYSICAL EXAM:   Vitals:   07/20/18 1650 07/20/18 1700 07/20/18 2237 07/21/18 0518  BP: (!) 87/60 (!) 109/56 103/68 118/81  Pulse: 82 79 87 89  Resp:  18  18  Temp:  97.7 F (36.5 C) 98.6 F (37 C) 98.4 F (36.9 C)  TempSrc:  Oral  Oral  SpO2:  96% 98% 97%  Weight:  70.3 kg    Height:       Good thrill in left forearm AV graft. Incisions healing nicely. His left hand is warm and well-perfused.  LABS:   CBG (last 3)  Recent Labs    07/20/18 1725 07/20/18 2239 07/21/18 0604  GLUCAP 94 101* 78    PROBLEM LIST:    Principal Problem:   E coli bacteremia Active Problems:   Chronic kidney disease (CKD), active medical management without dialysis, stage 3 (moderate) (HCC)   Diabetes mellitus type 2 in nonobese [facility])   Essential hypertension, benign   ICD (implantable cardioverter-defibrillator) in place   Acute kidney injury ([place])   HIV disease ([place])   Chronic combined systolic and diastolic heart failure (HCC)   Acute metabolic encephalopathy   Septic shock (HCC)   Encephalitis   ARF (acute renal failure) (HCC)   Sepsis with encephalopathy and septic shock (HCC)   Tachycardia   Diverticulosis   Malnutrition of moderate degree   CURRENT MEDS:   . bictegravir-emtricitabine-tenofovir AF  1 tablet Oral Daily  . Chlorhexidine Gluconate Cloth  6 each Topical Q0600  . feeding supplement (ENSURE ENLIVE)  237 mL Oral BID BM  . insulin aspart  0-9 Units Subcutaneous TID WC  . mouth rinse  15 mL Mouth Rinse BID  . metoprolol succinate  25 mg Oral Daily  . mometasone-formoterol  2 puff Inhalation BID  . multivitamin  1 tablet Oral QHS  . predniSONE  40 mg Oral Q breakfast  . warfarin   Does not apply Once  . Warfarin -  Pharmacist Dosing Inpatient   Does not apply [place]: [phone] Office: [phone] 07/21/2018

## 2018-07-21 NOTE — Progress Notes (Signed)
PROGRESS NOTE        PATIENT DETAILS Name: Keith Young Age: 68 y.o. Sex: male Date of Birth: 01-28-1951 Admit Date: 07/06/2018 Admitting Physician Etta Quill, DO WTU:UEKCM, Rene Kocher, MD  Brief Narrative: Patient is a 68 y.o. male with history of chronic systolic heart failure with AICD in place, bioprosthetic AVR, stage IV CKD, DM-2, COPD, HIV on antiretrovirals-presented with sepsis secondary to E. coli bacteremia and acute kidney injury.  See below for further details  Subjective: Awake-alert-eating breakfast this morning.  No chest pain or shortness of breath.  Answers all my questions appropriately-still slow though.  Assessment/Plan: Sepsis secondary to E. coli bacteremia: Sepsis pathophysiology has resolved-evaluated by infectious disease, recommendations are for 2-week duration of ceftazidime-end date of February 23.  CT abdomen negative for any foci for bacteremia.  Acute metabolic encephalopathy: Multifactorial-secondary to sepsis and also from uremia due to worsening renal function.  Encephalopathy has markedly improved-although slow-he is able to answer all my questions. Underwent LP on 2/11-CSF not consistent with meningitis.  Acute kidney injury on stage IV CKD: AKI likely secondary to ATN in the setting of sepsis.  Has required intermittent HD-s/p right IJ tunneled HD catheter, and left upper extremity AV graft placement by V VS on 2/21.  Nephrology following-and planning on observation to see if any signs of renal recovery-none so far.  Defer HD to nephrology service.  HIV: Continue antiretrovirals.  PAF: Rate controlled-continue metoprolol.  Since patient has completed vascular surgery procedures-Coumadin has been restarted-INR remains subtherapeutic.    Thrombocytopenia: Secondary to sepsis-resolved.  Chronic systolic heart failure (EF 20-25% by TTE on 07/12/2018): Volume status is stable-diuresis with HD.  Bioprosthetic AVR: TTE on  2/13 without any obvious vegetation-repeat blood cultures on 2/10.  DM-2 with hypoglycemia: CBGs stable-continue SSI.    Hypertension: Controlled-continue metoprolol.  Dysphagia: Speech therapy following-on dysphagia 2 diet.  Has prior history of CVA-along with profound deconditioning-may be contributing to oropharyngeal dysphagia.  Deconditioning/debility: Secondary to acute illness-PT evaluation completed-SNF on discharge  DVT Prophylaxis: SCD's  Code Status: Full code   Family Communication: None at bedside  Disposition Plan: Remain inpatient-SNF on discharge -but needs clipping process first.  Antimicrobial agents: Anti-infectives (From admission, onward)   Start     Dose/Rate Route Frequency Ordered Stop   07/20/18 0600  cefUROXime (ZINACEF) 1.5 g in sodium chloride 0.9 % 100 mL IVPB  Status:  Discontinued     1.5 g 200 mL/hr over 30 Minutes Intravenous On call to O.R. 07/19/18 1249 07/20/18 1155   07/12/18 2200  cefTAZidime (FORTAZ) 1 g in sodium chloride 0.9 % 100 mL IVPB     1 g 200 mL/hr over 30 Minutes Intravenous Every 24 hours 07/12/18 1449 07/22/18 2359   07/10/18 1430  vancomycin (VANCOCIN) IVPB 1000 mg/200 mL premix  Status:  Discontinued     1,000 mg 200 mL/hr over 60 Minutes Intravenous Every 48 hours 07/08/18 1421 07/09/18 0959   07/10/18 1000  cefTRIAXone (ROCEPHIN) 2 g in sodium chloride 0.9 % 100 mL IVPB  Status:  Discontinued     2 g 200 mL/hr over 30 Minutes Intravenous Every 24 hours 07/10/18 0942 07/12/18 1433   07/10/18 0847  cefTRIAXone (ROCEPHIN) 2 g in sodium chloride 0.9 % 100 mL IVPB  Status:  Discontinued     2 g 200 mL/hr over 30 Minutes  Intravenous Every 24 hours 07/09/18 1004 07/10/18 0933   07/09/18 1400  acyclovir (ZOVIRAX) 745 mg in dextrose 5 % 150 mL IVPB  Status:  Discontinued     10 mg/kg  74.5 kg 164.9 mL/hr over 60 Minutes Intravenous Every 24 hours 07/08/18 1421 07/09/18 0959   07/09/18 1300  bictegravir-emtricitabine-tenofovir  AF (BIKTARVY) 50-200-25 MG per tablet 1 tablet     1 tablet Oral Daily 07/09/18 1012     07/08/18 2300  cefTRIAXone (ROCEPHIN) 2 g in sodium chloride 0.9 % 100 mL IVPB  Status:  Discontinued     2 g 200 mL/hr over 30 Minutes Intravenous Every 12 hours 07/08/18 1341 07/09/18 1004   07/08/18 1800  vancomycin (VANCOCIN) IVPB 1000 mg/200 mL premix  Status:  Discontinued     1,000 mg 200 mL/hr over 60 Minutes Intravenous Every 48 hours 07/06/18 2019 07/07/18 1137   07/08/18 1430  ampicillin (OMNIPEN) 2 g in sodium chloride 0.9 % 100 mL IVPB  Status:  Discontinued     2 g 300 mL/hr over 20 Minutes Intravenous Every 12 hours 07/08/18 1343 07/09/18 0959   07/08/18 1430  vancomycin (VANCOCIN) 1,500 mg in sodium chloride 0.9 % 500 mL IVPB     1,500 mg 250 mL/hr over 120 Minutes Intravenous  Once 07/08/18 1421 07/08/18 1723   07/08/18 1400  acyclovir (ZOVIRAX) 745 mg in dextrose 5 % 150 mL IVPB     10 mg/kg  74.5 kg 164.9 mL/hr over 60 Minutes Intravenous  Once 07/08/18 1345 07/09/18 1315   07/07/18 1800  ceFEPIme (MAXIPIME) 1 g in sodium chloride 0.9 % 100 mL IVPB  Status:  Discontinued     1 g 200 mL/hr over 30 Minutes Intravenous Every 24 hours 07/06/18 2019 07/07/18 1137   07/07/18 1145  cefTRIAXone (ROCEPHIN) 2 g in sodium chloride 0.9 % 100 mL IVPB  Status:  Discontinued     2 g 200 mL/hr over 30 Minutes Intravenous Every 24 hours 07/07/18 1137 07/08/18 1341   07/07/18 1000  bictegravir-emtricitabine-tenofovir AF (BIKTARVY) 50-200-25 MG per tablet 1 tablet  Status:  Discontinued     1 tablet Oral Daily 07/07/18 0058 07/07/18 1127   07/06/18 1900  ceFEPIme (MAXIPIME) 2 g in sodium chloride 0.9 % 100 mL IVPB     2 g 200 mL/hr over 30 Minutes Intravenous  Once 07/06/18 1859 07/06/18 2001   07/06/18 1900  vancomycin (VANCOCIN) IVPB 1000 mg/200 mL premix     1,000 mg 200 mL/hr over 60 Minutes Intravenous  Once 07/06/18 1859 07/06/18 2035      Procedures: 2/21>> right IJ tunneled HD  catheter, and AV fistula placement by vascular surgery 2/11>>LP 2/10>> IR guided HD catheter placement  CONSULTS:  cardiology, nephrology, neurology and vascular surgery  ID  Time spent: 25-minutes-Greater than 50% of this time was spent in counseling, explanation of diagnosis, planning of further management, and coordination of care.  MEDICATIONS: Scheduled Meds: . bictegravir-emtricitabine-tenofovir AF  1 tablet Oral Daily  . Chlorhexidine Gluconate Cloth  6 each Topical Q0600  . feeding supplement (ENSURE ENLIVE)  237 mL Oral BID BM  . insulin aspart  0-9 Units Subcutaneous TID WC  . mouth rinse  15 mL Mouth Rinse BID  . metoprolol succinate  25 mg Oral Daily  . mometasone-formoterol  2 puff Inhalation BID  . multivitamin  1 tablet Oral QHS  . predniSONE  40 mg Oral Q breakfast  . warfarin  5 mg Oral ONCE-1800  . warfarin  Does not apply Once  . Warfarin - Pharmacist Dosing Inpatient   Does not apply q1800   Continuous Infusions: . sodium chloride 0 mL/hr at 07/15/18 0106  . cefTAZidime (FORTAZ)  IV 1 g (07/20/18 2338)   PRN Meds:.sodium chloride, acetaminophen **OR** acetaminophen, albuterol, HYDROcodone-acetaminophen, metoprolol tartrate, ondansetron **OR** ondansetron (ZOFRAN) IV, RESOURCE THICKENUP CLEAR, sodium chloride flush   PHYSICAL EXAM: Vital signs: Vitals:   07/20/18 1700 07/20/18 2237 07/21/18 0518 07/21/18 0925  BP: (!) 109/56 103/68 118/81   Pulse: 79 87 89   Resp: 18  18   Temp: 97.7 F (36.5 C) 98.6 F (37 C) 98.4 F (36.9 C)   TempSrc: Oral  Oral   SpO2: 96% 98% 97% 92%  Weight: 70.3 kg     Height:       Filed Weights   07/17/18 1231 07/20/18 1320 07/20/18 1700  Weight: 73.5 kg 72.1 kg 70.3 kg   Body mass index is 27.45 kg/m.  General appearance:Awake, alert, not in any distress.  Very frail and chronically sick appearing Eyes:no scleral icterus. HEENT: Atraumatic and Normocephalic Neck: supple Resp:Good air entry bilaterally,no rales  or rhonchi CVS: S1 S2 regular.  GI: Bowel sounds present, Non tender and not distended with no gaurding, rigidity or rebound. Extremities: B/L Lower Ext shows no edema, both legs are warm to touch   I have personally reviewed following labs and imaging studies  LABORATORY DATA: CBC: Recent Labs  Lab 07/15/18 0530 07/16/18 0430 07/17/18 0416 07/18/18 0341 07/20/18 0400 07/21/18 0426  WBC 4.3 2.8* 3.1* 6.9 5.2 5.1  NEUTROABS 3.0 2.0 2.1 5.7  --   --   HGB 12.4* 11.3* 11.8* 12.2* 11.7* 12.4*  HCT 38.4* 35.4* 37.3* 38.7* 35.8* 39.1  MCV 90.4 90.1 89.9 89.6 90.2 90.7  PLT 82* 89* 103* 127* 156 117*    Basic Metabolic Panel: Recent Labs  Lab 07/16/18 0430 07/17/18 0416 07/18/18 0341 07/19/18 1148 07/20/18 0400 07/21/18 0426  NA 139 139 140 140 138 138  K 3.7 3.5 3.7 3.3* 4.0 3.9  CL 104 103 105 106 104 101  CO2 22 23 25 24 24 27   GLUCOSE 135* 114* 93 113* 221* 90  BUN 87* 95* 48* 59* 65* 33*  CREATININE 5.94* 6.00* 3.74* 4.61* 4.95* 3.91*  CALCIUM 6.4* 6.4* 7.0* 6.8* 7.0* 7.0*  MG 2.2 2.1 1.9  --   --   --   PHOS  --   --   --   --   --  3.4    GFR: Estimated Creatinine Clearance: 16.4 mL/min (A) (by C-G formula based on SCr of 3.91 mg/dL (H)).  Liver Function Tests: Recent Labs  Lab 07/16/18 0430 07/21/18 0426  AST 31  --   ALT 36  --   ALKPHOS 62  --   BILITOT 0.5  --   PROT 5.5*  --   ALBUMIN 2.0* 2.1*   No results for input(s): LIPASE, AMYLASE in the last 168 hours. No results for input(s): AMMONIA in the last 168 hours.  Coagulation Profile: Recent Labs  Lab 07/18/18 0341 07/19/18 0500 07/20/18 0400 07/21/18 0426  INR 1.12 1.17 1.28 1.17    Cardiac Enzymes: No results for input(s): CKTOTAL, CKMB, CKMBINDEX, TROPONINI in the last 168 hours.  BNP (last 3 results) No results for input(s): PROBNP in the last 8760 hours.  HbA1C: No results for input(s): HGBA1C in the last 72 hours.  CBG: Recent Labs  Lab 07/20/18 1210 07/20/18 1725  07/20/18 2239  07/21/18 0604 07/21/18 1205  GLUCAP 93 94 101* 78 259*    Lipid Profile: No results for input(s): CHOL, HDL, LDLCALC, TRIG, CHOLHDL, LDLDIRECT in the last 72 hours.  Thyroid Function Tests: No results for input(s): TSH, T4TOTAL, FREET4, T3FREE, THYROIDAB in the last 72 hours.  Anemia Panel: No results for input(s): VITAMINB12, FOLATE, FERRITIN, TIBC, IRON, RETICCTPCT in the last 72 hours.  Urine analysis:    Component Value Date/Time   COLORURINE AMBER (A) 07/08/2018 0839   APPEARANCEUR CLOUDY (A) 07/08/2018 0839   LABSPEC 1.016 07/08/2018 0839   PHURINE 5.0 07/08/2018 0839   GLUCOSEU NEGATIVE 07/08/2018 0839   HGBUR SMALL (A) 07/08/2018 0839   BILIRUBINUR NEGATIVE 07/08/2018 0839   KETONESUR 5 (A) 07/08/2018 0839   PROTEINUR 100 (A) 07/08/2018 0839   UROBILINOGEN 0.2 10/23/2013 1545   NITRITE NEGATIVE 07/08/2018 0839   LEUKOCYTESUR LARGE (A) 07/08/2018 0839    Sepsis Labs: Lactic Acid, Venous    Component Value Date/Time   LATICACIDVEN 1.5 07/08/2018 1952    MICROBIOLOGY: No results found for this or any previous visit (from the past 240 hour(s)).  RADIOLOGY STUDIES/RESULTS: Ct Abdomen Pelvis Wo Contrast  Result Date: 07/12/2018 CLINICAL DATA:  Bacteremia EXAM: CT ABDOMEN AND PELVIS WITHOUT CONTRAST TECHNIQUE: Multidetector CT imaging of the abdomen and pelvis was performed following the standard protocol without IV contrast. COMPARISON:  None. FINDINGS: Lower chest: Lung bases demonstrate mild atelectatic changes without sizable effusion. Coronary calcifications are seen. Defibrillator is again noted. Hepatobiliary: No focal liver abnormality is seen. No gallstones, gallbladder wall thickening, or biliary dilatation. Pancreas: Unremarkable. No pancreatic ductal dilatation or surrounding inflammatory changes. Spleen: Normal in size without focal abnormality. Adrenals/Urinary Tract: Adrenal glands are within normal limits. Kidneys are well visualized  bilaterally. Right renal cyst is seen. A smaller left renal cyst is noted as well no renal calculi or obstructive changes are seen. The ureters are within normal limits bilaterally. The bladder is partially distended. Stomach/Bowel: Diverticular changes noted within the colon without evidence of diverticulitis. The appendix is within normal limits. No small bowel inflammatory changes are seen. Stomach is decompressed. Vascular/Lymphatic: Aortic atherosclerosis. No enlarged abdominal or pelvic lymph nodes. Reproductive: Prostate is unremarkable. Other: No abdominal wall hernia or abnormality. No abdominopelvic ascites. Musculoskeletal: Degenerative changes of the lumbar spine are seen. IMPRESSION: Diverticulosis without diverticulitis. Bilateral renal cysts. Mild bibasilar atelectasis. Electronically Signed   By: Inez Catalina M.D.   On: 07/12/2018 19:09   Dg Chest 1 View  Result Date: 07/07/2018 CLINICAL DATA:  67 year old male with history of tachycardia. EXAM: CHEST  1 VIEW COMPARISON:  Chest x-ray 07/06/2018. FINDINGS: Status post median sternotomy. External defibrillator pad projecting over the lower left hemithorax. Left-sided pacemaker/AICD with lead tip projecting over the expected location of the right ventricular apex. Lung volumes are slightly low. No acute consolidative airspace disease. Small left pleural effusion. No right pleural effusion. There is cephalization of the pulmonary vasculature and slight indistinctness of the interstitial markings suggestive of mild pulmonary edema. Mild cardiomegaly. The patient is rotated to the left on today's exam, resulting in distortion of the mediastinal contours and reduced diagnostic sensitivity and specificity for mediastinal pathology. Aortic atherosclerosis. IMPRESSION: 1. Mild interstitial pulmonary edema and small left pleural effusion in the setting of mild cardiomegaly; imaging findings concerning for congestive heart failure. 2. Aortic atherosclerosis.  3. Postoperative changes and support apparatus, as above. Electronically Signed   By: Vinnie Langton M.D.   On: 07/07/2018 11:54   Ct Head Wo  Contrast  Result Date: 07/11/2018 CLINICAL DATA:  Initial evaluation for acute altered mental status, asymmetric left-sided weakness with left facial droop. EXAM: CT HEAD WITHOUT CONTRAST TECHNIQUE: Contiguous axial images were obtained from the base of the skull through the vertex without intravenous contrast. COMPARISON:  Prior CT from 07/06/2018. FINDINGS: Brain: Age advanced cerebral atrophy with chronic microvascular ischemic disease, stable. Remote right MCA and/or MCA/PCA watershed territory infarct involving the right temporal occipital region noted, stable. Multiple chronic lacunar infarcts seen involving the bilateral basal ganglia, thalami, pons, and cerebellum. Specifically, previously noted right basal ganglia lacunar infarcts are stable from previous exam, likely chronic. No acute intracranial hemorrhage. No new or evolving acute large vessel territory infarct. No mass lesion, midline shift or mass effect. No hydrocephalus. No extra-axial fluid collection. Vascular: No hyperdense vessel. Calcified atherosclerosis at the skull base. Skull: Scalp soft tissues and calvarium within normal limits. Sinuses/Orbits: Globes and orbital soft tissues within normal limits. Scattered mucosal thickening with superimposed air-fluid levels noted within the paranasal sinuses, worsened from previous. Mastoid air cells are clear. Other: None. IMPRESSION: 1. Stable head CT, with no new acute intracranial abnormality. 2. In band cerebral atrophy with chronic small vessel ischemic disease with multiple chronic ischemic infarcts as above. 3. Acute pan sinusitis, worsened from previous. Electronically Signed   By: Jeannine Boga M.D.   On: 07/11/2018 17:20   Ct Head Wo Contrast  Result Date: 07/06/2018 CLINICAL DATA:  Altered mental status. History of HIV, hypertension,  hypercholesterolemia and diabetes. EXAM: CT HEAD WITHOUT CONTRAST TECHNIQUE: Contiguous axial images were obtained from the base of the skull through the vertex without intravenous contrast. COMPARISON:  CT HEAD July 21, 2016 FINDINGS: BRAIN: No intraparenchymal hemorrhage, mass effect, midline shift or acute large vascular territory infarcts. Old small RIGHT cerebellar and pontine infarcts. RIGHT temporal occipital encephalomalacia. New subcentimeter RIGHT internal capsule versus basal ganglia hypodensities. Old RIGHT basal ganglia infarct with mild ex vacuo dilatation RIGHT lateral ventricle. Old small bilateral thalami and LEFT basal ganglia infarcts. Confluent supratentorial white matter hypodensities. Moderate parenchymal brain volume loss. No hydrocephalus. VASCULAR: Moderate calcific atherosclerosis of the carotid siphons. SKULL: No skull fracture. No significant scalp soft tissue swelling. SINUSES/ORBITS: Mild paranasal sinus mucosal thickening. Chronic dehiscence of the RIGHT posterolateral maxillary wall. Mastoid air cells are well aerated.The included ocular globes and orbital contents are non-suspicious. OTHER: Bilateral parotid sialoliths. IMPRESSION: 1. New age indeterminate RIGHT basal ganglia small infarcts. 2. Old RIGHT temporal occipital/PCA territory infarct. 3. Old basal ganglia, thalami, pontine and cerebellar small infarcts. Moderate to severe chronic small vessel ischemic changes. 4. Moderate parenchymal brain volume loss, advanced for age. Electronically Signed   By: Elon Alas M.D.   On: 07/06/2018 22:16   US Renal  Result Date: 07/07/2018 CLINICAL DATA:  Acute kidney injury, history cardiomyopathy, chronic systolic heart failure, stage III chronic kidney disease, hypertension, COPD, former smoker EXAM: RENAL / URINARY TRACT ULTRASOUND COMPLETE COMPARISON:  09/05/2016 FINDINGS: Right Kidney: Renal measurements: 10.9.7 x 6.1 cm = volume: 200 mL. Cortical thinning. Increased  cortical echogenicity. Tiny mid renal cyst 8 x 6.9 mm. Additional peripelvic cyst 3.0 x 3.2 x 3.1 cm, simple features. No additional mass or hydronephrosis. No shadowing calculi. Left Kidney: Renal measurements: 12.3 x 6.6 x 6.6 cm = volume: 276 mL. Cortical thinning. Increased cortical echogenicity. Small cyst at inferior pole 1.0 x 0.9 x 1.2 cm. Additional small cyst at inferior pole 2.0 x 1.6 x 1.9 cm, simple features. No additional mass, hydronephrosis, or  shadowing calcification. Bladder: Decompressed, unable to evaluate. IMPRESSION: Medical renal disease changes of both kidneys. Small renal cysts in both kidneys. No evidence of hydronephrosis. Electronically Signed   By: Lavonia Dana M.D.   On: 07/07/2018 19:30   Ir Fluoro Guide Cv Line Right  Result Date: 07/09/2018 INDICATION: Acute uremia, acute kidney injury, no current access for dialysis EXAM: Ultrasound guidance for vascular access Right IJ temporary dialysis catheter (Mahurkar catheter) MEDICATIONS: 1% lidocaine local ANESTHESIA/SEDATION: Moderate Sedation Time: None. The patient's level of consciousness and vital signs were monitored continuously by radiology nursing throughout the procedure under my direct supervision. FLUOROSCOPY TIME:  Fluoroscopy Time: 0 minutes 24 seconds (2 mGy). COMPLICATIONS: None immediate. PROCEDURE: Emergency consent was obtained from the patient's physician after a thorough discussion of the procedural risks, benefits and alternatives. All questions were addressed. Maximal Sterile Barrier Technique was utilized including caps, mask, sterile gowns, sterile gloves, sterile drape, hand hygiene and skin antiseptic. A timeout was performed prior to the initiation of the procedure. Under sterile conditions and local anesthesia, ultrasound micropuncture access performed of the right internal jugular vein. Images obtained for documentation of the patent jugular vein. Guidewire advanced easily. Measurements obtained. Four [place]  dilator advanced. Amplatz guidewire advanced into the IVC. Tract dilatation performed to insert a temporary dialysis catheter. Tip position at the SVC RA junction. Images obtained for documentation. Catheter secured Prolene sutures. Blood aspirated easily followed by saline and heparin flushes. Appropriate volume and strength of heparin instilled in all lumens. External caps applied. Catheter secured with Prolene sutures and a sterile dressing. No immediate complication. Patient tolerated the procedure well. IMPRESSION: Successful ultrasound and fluoroscopic right IJ temporary dialysis catheter. Tip SVC RA junction. Ready for use. Electronically Signed   By: Jerilynn Mages.  Shick M.D.   On: 07/09/2018 13:06   Ir US Guide Vasc Access Right  Result Date: 07/09/2018 INDICATION: Acute uremia, acute kidney injury, no current access for dialysis EXAM: Ultrasound guidance for vascular access Right IJ temporary dialysis catheter (Mahurkar catheter) MEDICATIONS: 1% lidocaine local ANESTHESIA/SEDATION: Moderate Sedation Time: None. The patient's level of consciousness and vital signs were monitored continuously by radiology nursing throughout the procedure under my direct supervision. FLUOROSCOPY TIME:  Fluoroscopy Time: 0 minutes 24 seconds (2 mGy). COMPLICATIONS: None immediate. PROCEDURE: Emergency consent was obtained from the patient's physician after a thorough discussion of the procedural risks, benefits and alternatives. All questions were addressed. Maximal Sterile Barrier Technique was utilized including caps, mask, sterile gowns, sterile gloves, sterile drape, hand hygiene and skin antiseptic. A timeout was performed prior to the initiation of the procedure. Under sterile conditions and local anesthesia, ultrasound micropuncture access performed of the right internal jugular vein. Images obtained for documentation of the patent jugular vein. Guidewire advanced easily. Measurements obtained. Four [place] dilator advanced.  Amplatz guidewire advanced into the IVC. Tract dilatation performed to insert a temporary dialysis catheter. Tip position at the SVC RA junction. Images obtained for documentation. Catheter secured Prolene sutures. Blood aspirated easily followed by saline and heparin flushes. Appropriate volume and strength of heparin instilled in all lumens. External caps applied. Catheter secured with Prolene sutures and a sterile dressing. No immediate complication. Patient tolerated the procedure well. IMPRESSION: Successful ultrasound and fluoroscopic right IJ temporary dialysis catheter. Tip SVC RA junction. Ready for use. Electronically Signed   By: Jerilynn Mages.  Shick M.D.   On: 07/09/2018 13:06   Dg Chest Port 1 View  Result Date: 07/20/2018 CLINICAL DATA:  Placement of right IJ dialysis catheter. EXAM:  PORTABLE CHEST 1 VIEW COMPARISON:  07/12/2010 FINDINGS: The prior IJ catheter was removed. There is a new double lumen dialysis catheter in place. The distal tip is in the region of the right atrium and the proximal tip is likely near the cavoatrial junction. No complicating features. The right ventricular pacer wire is stable. Stable surgical changes from bypass surgery. The heart remains enlarged. There is tortuosity and calcification of the thoracic aorta. Mild vascular congestion without overt pulmonary edema. No pleural effusions or pneumothorax. IMPRESSION: Right IJ dialysis catheter in good position without complicating features. Cardiac enlargement and vascular congestion without overt pulmonary edema. Electronically Signed   By: Marijo Sanes M.D.   On: 07/20/2018 10:53   Dg Chest Port 1 View  Result Date: 07/12/2018 CLINICAL DATA:  Wheezing EXAM: PORTABLE CHEST 1 VIEW COMPARISON:  07/11/2018 FINDINGS: Right IJ approach hemodialysis catheter tip is in the right atrium. Unchanged position of left chest wall single lead AICD. Mild interstitial pulmonary edema. Mild cardiomegaly with findings of prior CABG. Trace left  pleural effusion. IMPRESSION: Mild cardiomegaly and mild interstitial pulmonary edema. Electronically Signed   By: Ulyses Jarred M.D.   On: 07/12/2018 04:32   Dg Chest Port 1 View  Result Date: 07/11/2018 CLINICAL DATA:  Shortness of breath EXAM: PORTABLE CHEST 1 VIEW COMPARISON:  07/07/2018 FINDINGS: Left chest wall AICD lead is in unchanged position. There is mild cardiomegaly. Central pulmonary vascular congestion without overt pulmonary edema. No focal airspace consolidation. Small left pleural effusion. No pneumothorax. IMPRESSION: Cardiomegaly and small left pleural effusion without overt pulmonary edema. Electronically Signed   By: Ulyses Jarred M.D.   On: 07/11/2018 03:35   Dg Chest Port 1 View  Result Date: 07/06/2018 CLINICAL DATA:  Sepsis EXAM: PORTABLE CHEST 1 VIEW COMPARISON:  Portable exam 2023 hours compared to 12/09/2016 FINDINGS: LEFT subclavian AICD with lead projecting over RIGHT ventricle. External pacing lead present. Enlargement of cardiac silhouette with postsurgical changes of CABG. Atherosclerotic calcification aorta. Mediastinal contours normal. Subsegmental atelectasis LEFT lower lobe. Pulmonary vascular congestion with interstitial prominence at the mid to lower lungs which could represent minimal pulmonary edema. No pleural effusion or pneumothorax. Bones demineralized. IMPRESSION: Enlargement of cardiac silhouette with vascular congestion and question minimal pulmonary edema. Electronically Signed   By: Lavonia Dana M.D.   On: 07/06/2018 20:35   Dg Swallowing Func-speech Pathology  Result Date: 07/14/2018 Objective Swallowing Evaluation: Type of Study: MBS-Modified Barium Swallow Study  Patient Details Name: Keith Young MRN: [id number] Date of Birth: 04-11-1951 Today's Date: 07/14/2018 Time: SLP Start Time (ACUTE ONLY): 47 -SLP Stop Time (ACUTE ONLY): 1150 SLP Time Calculation (min) (ACUTE ONLY): 20 min Past Medical History: Past Medical History: Diagnosis Date . AICD  (automatic cardioverter/defibrillator) present  . Alcoholism in remission ([place])  . Anemia  . Aortic insufficiency  . Arthritis  . At moderate risk for fall  . Bell's palsy  . Chronic systolic heart failure (HCC)   NYHA Class III . CKD (chronic kidney disease), stage IV ([place])  . COPD (chronic obstructive pulmonary disease) ([place])  . Essential hypertension, benign  . HIV disease ([place]) 09/06/2016 . Hyperlipidemia  . NICM (nonischemic cardiomyopathy) ([place])  . Noncompliance with medication regimen  . NSVT (nonsustained ventricular tachycardia) ([place])  . Numbness of right jaw   Had a stoke in 01/2013. Numbness is occasional, especially when trying to chew. . OSA (obstructive sleep apnea)  . Productive cough 08/2013  With brown sputum  . S/P aortic valve replacement with bioprosthetic  valve  . Stroke ([place]) 01/2013  weakness of right side from CVA . Type 2 diabetes mellitus ([place])  . Uses hearing aid 2014  recently received new hearing aids Past Surgical History: Past Surgical History: Procedure Laterality Date . BIOPSY N/A 04/10/2014  Procedure: GASTRIC BIOPSY;  Surgeon: Daneil Dolin, MD;  Location: [facility];  Service: Endoscopy;  Laterality: N/A; . BIOPSY  10/12/2015  Procedure: BIOPSY;  Surgeon: Daneil Dolin, MD;  Location: [facility];  Service: Endoscopy;;  stomach bx's . CARDIAC DEFIBRILLATOR PLACEMENT  11/12/12  [employer] Inogen MINI ICD implanted in [place] at [facility] per Dr Nelly Laurence note . COLONOSCOPY WITH PROPOFOL N/A 04/10/2014  RMR: Colonic Diverticulosis . ESOPHAGOGASTRODUODENOSCOPY (EGD) WITH PROPOFOL N/A 04/10/2014  RMR: Mild erosive reflux esophagitis. Multiple antral polyps likely hyperplastic status post removal by hot snare cautery technique. Diffusely abnormal stomach status post gastric biopsy I suspect some of patients anemaia may be due to intermittent oozing from the stomach. It would be difficult and a risky proposition to attempt complete  removal of all of his gastric polyps. . ESOPHAGOGASTRODUODENOSCOPY (EGD) WITH PROPOFOL N/A 10/12/2015  Procedure: ESOPHAGOGASTRODUODENOSCOPY (EGD) WITH PROPOFOL;  Surgeon: Daneil Dolin, MD;  Location: [facility];  Service: Endoscopy;  Laterality: N/A;  3149 - moved to 9:15 . IR FLUORO GUIDE CV LINE RIGHT  07/09/2018 . IR US GUIDE VASC ACCESS RIGHT  07/09/2018 . POLYPECTOMY N/A 04/10/2014  Procedure: GASTRIC POLYPECTOMY;  Surgeon: Daneil Dolin, MD;  Location: [facility];  Service: Endoscopy;  Laterality: N/A; . RIGHT HEART CATHETERIZATION N/A 02/06/2014  Procedure: RIGHT HEART CATH;  Surgeon: Larey Dresser, MD;  Location: [facility] CATH LAB;  Service: Cardiovascular;  Laterality: N/A; HPI: DAXTEN KOVALENKO is a 68 y.o. male with PMH significant of HIV, type 2 diabetes, COPD, CVA (70[address], HTN, Bells Palsy, systolic CHF, CKD, and history of AICD placement followed by cardiology.  Pt was admitted to [facility] ED 07/07/18 and after presenting with AMS at SNF residence, vomitted, and became increasingly unresponsive. Per PA note, pt was found to be encephalopathic secondary to sepsis and renal failure. Pt seen by ST in Jan 2018; BSE recc D2/thin. CXR showed cardiomegaly and small left pleural effusion. Head CT revealed new age indeterminate RIGHT basal ganglia small infarcts, old RIGHT temporal occipital/PCA territory infarct, old basal ganglia, thalami, pontine and cerebellar small infarcts.  Subjective: alert, cooperative Assessment / Plan / Recommendation CHL IP CLINICAL IMPRESSIONS 07/14/2018 Clinical Impression Pt presents with primary moderate oral dysphagia, mild pharyngeal dysphagia. There is lingual pumping, decreased bolus cohesion, piecemeal deglutition and premature spillage (to pyriforms with liquids, valleculae with solids). Premature spillage results in intermittent deep larygneal penetration of thin, nectar, and honey thick liquids, with aspiration (sensed and silent) of thin and nectar. Due to impulsivity pt at risk for  aspiration with liquids of any consistency. With solids, pt without rotary chew, lingual thrusting noted with poor cohesion and spillage of solids to the valleculae. SLP used straw to facilitate chin tuck, which prevented aspiration with thin liquids, including small and large sips and consecutive boluses. There was trace shallow penetration with chin tuck which reversed with swallow. Brief stasis of barium tablet in mid-thoracic esophagus, cleared pt's GE junction with liquid wash. Recommend dys 2, thin liquids, meds whole in puree; pt must tuck chin when swallowing for airway protection. Recommend full supervision due to level of cuing necessary. SLP will follow for tolerance and training in  swallow precautions/compensations.  SLP Visit Diagnosis Dysphagia, oral phase (R13.11);Dysphagia, pharyngeal phase (R13.13) Attention and concentration deficit following -- Frontal lobe and executive function deficit following -- Impact on safety and function Moderate aspiration risk   CHL IP TREATMENT RECOMMENDATION 07/14/2018 Treatment Recommendations Therapy as outlined in treatment plan below   Prognosis 07/14/2018 Prognosis for Safe Diet Advancement Good Barriers to Reach Goals Cognitive deficits Barriers/Prognosis Comment -- CHL IP DIET RECOMMENDATION 07/14/2018 SLP Diet Recommendations Dysphagia 2 (Fine chop) solids;Thin liquid Liquid Administration via Straw Medication Administration Whole meds with puree Compensations Slow rate;Small sips/bites;Chin tuck;Clear throat intermittently;Use straw to facilitate chin tuck Postural Changes --   CHL IP OTHER RECOMMENDATIONS 07/14/2018 Recommended Consults -- Oral Care Recommendations Oral care BID Other Recommendations --   CHL IP FOLLOW UP RECOMMENDATIONS 07/14/2018 Follow up Recommendations Skilled Nursing facility   [facility] IP FREQUENCY AND DURATION 07/14/2018 Speech Therapy Frequency (ACUTE ONLY) min 2x/week Treatment Duration 2 weeks      CHL IP ORAL PHASE 07/14/2018 Oral Phase  Impaired Oral - Pudding Teaspoon -- Oral - Pudding Cup -- Oral - Honey Teaspoon NT Oral - Honey Cup Weak lingual manipulation;Lingual pumping;Reduced posterior propulsion;Piecemeal swallowing;Decreased bolus cohesion;Premature spillage Oral - Nectar Teaspoon Weak lingual manipulation;Lingual pumping;Reduced posterior propulsion;Piecemeal swallowing;Decreased bolus cohesion;Premature spillage Oral - Nectar Cup Weak lingual manipulation;Lingual pumping;Reduced posterior propulsion;Piecemeal swallowing;Decreased bolus cohesion;Premature spillage Oral - Nectar Straw Weak lingual manipulation;Lingual pumping;Reduced posterior propulsion;Piecemeal swallowing;Delayed oral transit;Decreased bolus cohesion Oral - Thin Teaspoon Lingual pumping;Reduced posterior propulsion;Piecemeal swallowing;Decreased bolus cohesion;Premature spillage Oral - Thin Cup Weak lingual manipulation;Lingual pumping;Reduced posterior propulsion;Piecemeal swallowing;Decreased bolus cohesion;Premature spillage Oral - Thin Straw Weak lingual manipulation;Reduced posterior propulsion;Piecemeal swallowing;Decreased bolus cohesion;Premature spillage Oral - Puree Weak lingual manipulation;Lingual pumping;Reduced posterior propulsion;Piecemeal swallowing;Decreased bolus cohesion;Premature spillage Oral - Mech Soft Impaired mastication;Weak lingual manipulation;Lingual pumping;Reduced posterior propulsion;Lingual/palatal residue;Piecemeal swallowing;Delayed oral transit;Premature spillage;Decreased bolus cohesion Oral - Regular -- Oral - Multi-Consistency -- Oral - Pill Other (Comment) Oral Phase - Comment --  CHL IP PHARYNGEAL PHASE 07/14/2018 Pharyngeal Phase Impaired Pharyngeal- Pudding Teaspoon -- Pharyngeal -- Pharyngeal- Pudding Cup -- Pharyngeal -- Pharyngeal- Honey Teaspoon -- Pharyngeal -- Pharyngeal- Honey Cup Penetration/Aspiration before swallow;Penetration/Aspiration during swallow Pharyngeal Material enters airway, remains ABOVE vocal cords then  ejected out;Material enters airway, remains ABOVE vocal cords and not ejected out Pharyngeal- Nectar Teaspoon -- Pharyngeal -- Pharyngeal- Nectar Cup Penetration/Aspiration before swallow;Penetration/Aspiration during swallow;Trace aspiration Pharyngeal Material does not enter airway;Material enters airway, CONTACTS cords and not ejected out;Material enters airway, passes BELOW cords without attempt by patient to eject out (silent aspiration) Pharyngeal- Nectar Straw Penetration/Aspiration before swallow;Penetration/Aspiration during swallow;Trace aspiration Pharyngeal Material enters airway, CONTACTS cords and not ejected out;Material enters airway, passes BELOW cords without attempt by patient to eject out (silent aspiration) Pharyngeal- Thin Teaspoon Penetration/Aspiration before swallow;Penetration/Aspiration during swallow;Moderate aspiration Pharyngeal Material enters airway, passes BELOW cords and not ejected out despite cough attempt by patient Pharyngeal- Thin Cup Penetration/Aspiration before swallow;Penetration/Aspiration during swallow;Moderate aspiration Pharyngeal Material does not enter airway;Material enters airway, CONTACTS cords and not ejected out;Material enters airway, passes BELOW cords and not ejected out despite cough attempt by patient;Material enters airway, passes BELOW cords without attempt by patient to eject out (silent aspiration) Pharyngeal- Thin Straw Penetration/Aspiration during swallow;Other (Comment) Pharyngeal Material does not enter airway;Material enters airway, remains ABOVE vocal cords then ejected out Pharyngeal- Puree WFL Pharyngeal -- Pharyngeal- Mechanical Soft WFL Pharyngeal -- Pharyngeal- Regular -- Pharyngeal -- Pharyngeal- Multi-consistency -- Pharyngeal -- Pharyngeal- Pill Other (Comment) Pharyngeal -- Pharyngeal Comment --  CHL IP CERVICAL  ESOPHAGEAL PHASE 07/14/2018 Cervical Esophageal Phase WFL Pudding Teaspoon -- Pudding Cup -- Honey Teaspoon -- Honey Cup --  Nectar Teaspoon -- Nectar Cup -- Nectar Straw -- Thin Teaspoon -- Thin Cup -- Thin Straw -- Puree -- Mechanical Soft -- Regular -- Multi-consistency -- Pill -- Cervical Esophageal Comment -- Deneise Lever, MS, [place] Pager: [phone] Office: [phone] Aliene Altes 07/14/2018, 1:46 PM              Dg Cyndy Freeze Guide Cv Line-no Report  Result Date: 07/20/2018 Fluoroscopy was utilized by the requesting physician.  No radiographic interpretation.   Vas [place] Upper Ext Vein Mapping (pre-op Avf)  Result Date: 07/18/2018 UPPER EXTREMITY VEIN MAPPING  Indications: Pre-access. Performing Technologist: Abram Sander RVS  Examination Guidelines: A complete evaluation includes B-mode imaging, spectral Doppler, color Doppler, and power Doppler as needed of all accessible portions of each vessel. Bilateral testing is considered an integral part of a complete examination. Limited examinations for reoccurring indications may be performed as noted. +-----------------+-------------+----------+--------------+ Right Cephalic   Diameter (cm)Depth (cm)   Findings    +-----------------+-------------+----------+--------------+ Shoulder             0.50        1.23                  +-----------------+-------------+----------+--------------+ Prox upper arm       0.55        0.84                  +-----------------+-------------+----------+--------------+ Mid upper arm        0.53        0.27                  +-----------------+-------------+----------+--------------+ Dist upper arm       0.41        0.41                  +-----------------+-------------+----------+--------------+ Antecubital fossa    0.50        0.29                  +-----------------+-------------+----------+--------------+ Prox forearm         0.24        0.22     branching    +-----------------+-------------+----------+--------------+ Mid forearm                              not visualized +-----------------+-------------+----------+--------------+ Dist forearm                            not visualized +-----------------+-------------+----------+--------------+ +-----------------+-------------+----------+--------+ Right Basilic    Diameter (cm)Depth (cm)Findings +-----------------+-------------+----------+--------+ Prox upper arm       0.36        1.36            +-----------------+-------------+----------+--------+ Mid upper arm        0.44        1.66            +-----------------+-------------+----------+--------+ Dist upper arm       0.56        0.95            +-----------------+-------------+----------+--------+ Antecubital fossa    0.64        0.50            +-----------------+-------------+----------+--------+ Prox forearm  0.29        0.21            +-----------------+-------------+----------+--------+ Mid forearm          0.32        0.20            +-----------------+-------------+----------+--------+ Distal forearm       0.30        0.20            +-----------------+-------------+----------+--------+ +-----------------+-------------+----------+---------+ Left Cephalic    Diameter (cm)Depth (cm)Findings  +-----------------+-------------+----------+---------+ Shoulder             0.40        0.98             +-----------------+-------------+----------+---------+ Prox upper arm       0.43        0.62             +-----------------+-------------+----------+---------+ Mid upper arm        0.41        0.36             +-----------------+-------------+----------+---------+ Dist upper arm       0.39        0.37             +-----------------+-------------+----------+---------+ Antecubital fossa    0.50        0.34   branching +-----------------+-------------+----------+---------+ Prox forearm         0.31        0.18             +-----------------+-------------+----------+---------+  Mid forearm          0.29        0.19             +-----------------+-------------+----------+---------+ Dist forearm         0.29        0.17             +-----------------+-------------+----------+---------+ +-----------------+-------------+----------+--------------+ Left Basilic     Diameter (cm)Depth (cm)   Findings    +-----------------+-------------+----------+--------------+ Prox upper arm       0.70        1.48                  +-----------------+-------------+----------+--------------+ Mid upper arm        0.57        1.30     branching    +-----------------+-------------+----------+--------------+ Dist upper arm                          not visualized +-----------------+-------------+----------+--------------+ Antecubital fossa                       not visualized +-----------------+-------------+----------+--------------+ Prox forearm                            not visualized +-----------------+-------------+----------+--------------+ Mid forearm                             not visualized +-----------------+-------------+----------+--------------+ Distal forearm                          not visualized +-----------------+-------------+----------+--------------+ *See table(s) above for measurements and observations.  Diagnosing physician: Servando Snare MD Electronically signed by Servando Snare MD on 07/18/2018 at 5:32:40 PM.    Final  Dg Fluoro Guide Lumbar Puncture  Result Date: 07/10/2018 CLINICAL DATA:  68 year old male with encephalopathy. Subsequent encounter. EXAM: DIAGNOSTIC LUMBAR PUNCTURE UNDER FLUOROSCOPIC GUIDANCE FLUOROSCOPY TIME:  Fluoroscopy Time:  6 seconds PROCEDURE: Patient was not able to give consent. Family (sister) not available by phone. Emergency request/consent from Dr. Candiss Norse. Labs and head CT reviewed. Under fluoroscopic guidance and aseptic technique (utilizing 1% lidocaine as local anesthetic), L3-4 lumbar puncture was performed  with a single pass of a 22 gauge spinal needle. Opening pressure 26 and closing pressure 20 cc water (not felt to be accurate as patient was lying on the stomach and moving throughout the exam). 8.5 cc of clear cerebral spinal fluid collected and sent for labs. Patient tolerated procedure well without any difficulty. IMPRESSION: Successful L3-4 lumbar puncture as noted above. Electronically Signed   By: Genia Del M.D.   On: 07/10/2018 15:12     LOS: 14 days   Oren Binet, MD  [facility]  If 7PM-7AM, please contact night-coverage  Please page via [email or web address]  Go to [email or web address] and use [place]'s universal password to access. If you do not have the password, please contact the hospital operator.  Locate the [facility] provider you are looking for under [facility] and page to a number that you can be directly reached. If you still have difficulty reaching the provider, please page the [facility] (Director on Call) for the Hospitalists listed on amion for assistance.  07/21/2018, 1:16 PM

## 2018-07-22 LAB — PROTIME-INR
INR: 1.45
PROTHROMBIN TIME: 17.5 s — AB (ref 11.4–15.2)

## 2018-07-22 LAB — RENAL FUNCTION PANEL
Albumin: 2 g/dL — ABNORMAL LOW (ref 3.5–5.0)
Anion gap: 11 (ref 5–15)
BUN: 57 mg/dL — ABNORMAL HIGH (ref 8–23)
CHLORIDE: 102 mmol/L (ref 98–111)
CO2: 24 mmol/L (ref 22–32)
Calcium: 6.8 mg/dL — ABNORMAL LOW (ref 8.9–10.3)
Creatinine, Ser: 5.88 mg/dL — ABNORMAL HIGH (ref 0.61–1.24)
GFR calc Af Amer: 11 mL/min — ABNORMAL LOW (ref >60)
GFR calc non Af Amer: 9 mL/min — ABNORMAL LOW (ref >60)
Glucose, Bld: 146 mg/dL — ABNORMAL HIGH (ref 70–99)
Phosphorus: 4.3 mg/dL (ref 2.5–4.6)
Potassium: 3.9 mmol/L (ref 3.5–5.1)
Sodium: 137 mmol/L (ref 135–145)

## 2018-07-22 LAB — GLUCOSE, CAPILLARY
GLUCOSE-CAPILLARY: 113 mg/dL — AB (ref 70–99)
Glucose-Capillary: 248 mg/dL — ABNORMAL HIGH (ref 70–99)
Glucose-Capillary: 299 mg/dL — ABNORMAL HIGH (ref 70–99)
Glucose-Capillary: 228 mg/dL — ABNORMAL HIGH (ref 70–99)

## 2018-07-22 MED ORDER — CHLORHEXIDINE GLUCONATE CLOTH 2 % EX PADS
6.0000 | MEDICATED_PAD | Freq: Every day | CUTANEOUS | Status: DC
  Administered 2018-07-22 – 2018-07-26 (×4): 6 via TOPICAL

## 2018-07-22 MED ORDER — WARFARIN SODIUM 5 MG PO TABS
5.0000 mg | ORAL_TABLET | Freq: Once | ORAL | Status: AC
  Administered 2018-07-22: 5 mg via ORAL
  Filled 2018-07-22: qty 1

## 2018-07-22 MED ORDER — RESOURCE THICKENUP CLEAR PO POWD
ORAL | Status: DC | PRN
  Administered 2018-07-23: 06:00:00 via ORAL
  Filled 2018-07-22: qty 125

## 2018-07-22 NOTE — Progress Notes (Signed)
Notified by Tele at 0208 of 4 beat run of non-sustained V Tach. Notified NP [place] at 0255.

## 2018-07-22 NOTE — Progress Notes (Signed)
PROGRESS NOTE        PATIENT DETAILS Name: Keith Young Age: 68 y.o. Sex: male Date of Birth: 29-Aug-1950 Admit Date: 07/06/2018 Admitting Physician Etta Quill, DO VXB:[id number], Rene Kocher, MD  Brief Narrative: Patient is a 68 y.o. male with history of chronic systolic heart failure with AICD in place, bioprosthetic AVR, stage IV CKD, DM-2, COPD, HIV on antiretrovirals-presented with sepsis secondary to E. coli bacteremia and acute kidney injury.  See below for further details  Subjective: No major issues overnight-sitting up at bedside in eating breakfast when I saw him earlier this morning.  No chest pain or shortness of breath.  He seemed alert-still slow to respond but if you give him time-answers most  questions appropriately  Assessment/Plan: Sepsis secondary to E. coli bacteremia: Sepsis pathophysiology has resolved-evaluated by infectious disease, recommendations are for 2-week duration of ceftazidime-end date of February 23.  CT abdomen negative for any foci for bacteremia.  Acute metabolic encephalopathy: Multifactorial-secondary to sepsis and also from uremia due to worsening renal function.  Encephalopathy has markedly improved-although slow-he is able to answer all my questions. Underwent LP on 2/11-CSF not consistent with meningitis.  Acute kidney injury on stage IV CKD and now thought to have ESRD: AKI likely secondary to ATN in the setting of sepsis.  Has required intermittent HD-s/p right IJ tunneled HD catheter, and left upper extremity AV graft placement by V VS on 2/21.  No signs of recovery so far-nephrology feels like this is probably ESRD.  Awaiting clipping process-defer further HD to the nephrology service.    HIV: Continue antiretrovirals.  PAF: Rate controlled with metoprolol-INR still subtherapeutic but slowly increasing.  Coumadin per pharmacy.    Thrombocytopenia: Secondary to sepsis-resolved.  Chronic systolic heart failure (EF  20-25% by TTE on 07/12/2018): Volume status is stable-diuresis with HD.  Bioprosthetic AVR: TTE on 2/13 without any obvious vegetation-repeat blood cultures on 2/10.  DM-2 with hypoglycemia: CBGs stable-continue SSI.    Hypertension: Controlled-continue metoprolol.  Dysphagia: Speech therapy following-on dysphagia 2 diet.  Has prior history of CVA-along with profound deconditioning-may be contributing to oropharyngeal dysphagia.  Deconditioning/debility: Secondary to acute illness-PT evaluation completed-SNF on discharge  DVT Prophylaxis: SCD's  Code Status: Full code   Family Communication: None at bedside  Disposition Plan: Remain inpatient-SNF on discharge -but needs clipping process first.  Antimicrobial agents: Anti-infectives (From admission, onward)   Start     Dose/Rate Route Frequency Ordered Stop   07/20/18 0600  cefUROXime (ZINACEF) 1.5 g in sodium chloride 0.9 % 100 mL IVPB  Status:  Discontinued     1.5 g 200 mL/hr over 30 Minutes Intravenous On call to O.R. 07/19/18 1249 07/20/18 1155   07/12/18 2200  cefTAZidime (FORTAZ) 1 g in sodium chloride 0.9 % 100 mL IVPB     1 g 200 mL/hr over 30 Minutes Intravenous Every 24 hours 07/12/18 1449 07/22/18 2359   07/10/18 1430  vancomycin (VANCOCIN) IVPB 1000 mg/200 mL premix  Status:  Discontinued     1,000 mg 200 mL/hr over 60 Minutes Intravenous Every 48 hours 07/08/18 1421 07/09/18 0959   07/10/18 1000  cefTRIAXone (ROCEPHIN) 2 g in sodium chloride 0.9 % 100 mL IVPB  Status:  Discontinued     2 g 200 mL/hr over 30 Minutes Intravenous Every 24 hours 07/10/18 0942 07/12/18 1433   07/10/18 0847  cefTRIAXone (ROCEPHIN) 2 g in sodium chloride 0.9 % 100 mL IVPB  Status:  Discontinued     2 g 200 mL/hr over 30 Minutes Intravenous Every 24 hours 07/09/18 1004 07/10/18 0933   07/09/18 1400  acyclovir (ZOVIRAX) 745 mg in dextrose 5 % 150 mL IVPB  Status:  Discontinued     10 mg/kg  74.5 kg 164.9 mL/hr over 60 Minutes  Intravenous Every 24 hours 07/08/18 1421 07/09/18 0959   07/09/18 1300  bictegravir-emtricitabine-tenofovir AF (BIKTARVY) 50-200-25 MG per tablet 1 tablet     1 tablet Oral Daily 07/09/18 1012     07/08/18 2300  cefTRIAXone (ROCEPHIN) 2 g in sodium chloride 0.9 % 100 mL IVPB  Status:  Discontinued     2 g 200 mL/hr over 30 Minutes Intravenous Every 12 hours 07/08/18 1341 07/09/18 1004   07/08/18 1800  vancomycin (VANCOCIN) IVPB 1000 mg/200 mL premix  Status:  Discontinued     1,000 mg 200 mL/hr over 60 Minutes Intravenous Every 48 hours 07/06/18 2019 07/07/18 1137   07/08/18 1430  ampicillin (OMNIPEN) 2 g in sodium chloride 0.9 % 100 mL IVPB  Status:  Discontinued     2 g 300 mL/hr over 20 Minutes Intravenous Every 12 hours 07/08/18 1343 07/09/18 0959   07/08/18 1430  vancomycin (VANCOCIN) 1,500 mg in sodium chloride 0.9 % 500 mL IVPB     1,500 mg 250 mL/hr over 120 Minutes Intravenous  Once 07/08/18 1421 07/08/18 1723   07/08/18 1400  acyclovir (ZOVIRAX) 745 mg in dextrose 5 % 150 mL IVPB     10 mg/kg  74.5 kg 164.9 mL/hr over 60 Minutes Intravenous  Once 07/08/18 1345 07/09/18 1315   07/07/18 1800  ceFEPIme (MAXIPIME) 1 g in sodium chloride 0.9 % 100 mL IVPB  Status:  Discontinued     1 g 200 mL/hr over 30 Minutes Intravenous Every 24 hours 07/06/18 2019 07/07/18 1137   07/07/18 1145  cefTRIAXone (ROCEPHIN) 2 g in sodium chloride 0.9 % 100 mL IVPB  Status:  Discontinued     2 g 200 mL/hr over 30 Minutes Intravenous Every 24 hours 07/07/18 1137 07/08/18 1341   07/07/18 1000  bictegravir-emtricitabine-tenofovir AF (BIKTARVY) 50-200-25 MG per tablet 1 tablet  Status:  Discontinued     1 tablet Oral Daily 07/07/18 0058 07/07/18 1127   07/06/18 1900  ceFEPIme (MAXIPIME) 2 g in sodium chloride 0.9 % 100 mL IVPB     2 g 200 mL/hr over 30 Minutes Intravenous  Once 07/06/18 1859 07/06/18 2001   07/06/18 1900  vancomycin (VANCOCIN) IVPB 1000 mg/200 mL premix     1,000 mg 200 mL/hr over 60  Minutes Intravenous  Once 07/06/18 1859 07/06/18 2035      Procedures: 2/21>> right IJ tunneled HD catheter, and AV fistula placement by vascular surgery 2/11>>LP 2/10>> IR guided HD catheter placement  CONSULTS:  cardiology, nephrology, neurology and vascular surgery  ID  Time spent: 25-minutes-Greater than 50% of this time was spent in counseling, explanation of diagnosis, planning of further management, and coordination of care.  MEDICATIONS: Scheduled Meds: . bictegravir-emtricitabine-tenofovir AF  1 tablet Oral Daily  . Chlorhexidine Gluconate Cloth  6 each Topical Q0600  . feeding supplement (ENSURE ENLIVE)  237 mL Oral BID BM  . insulin aspart  0-9 Units Subcutaneous TID WC  . mouth rinse  15 mL Mouth Rinse BID  . metoprolol succinate  25 mg Oral Daily  . mometasone-formoterol  2 puff Inhalation BID  .  multivitamin  1 tablet Oral QHS  . predniSONE  40 mg Oral Q breakfast  . warfarin  5 mg Oral ONCE-1800  . warfarin   Does not apply Once  . Warfarin - Pharmacist Dosing Inpatient   Does not apply q1800   Continuous Infusions: . sodium chloride 0 mL/hr at 07/15/18 0106  . cefTAZidime (FORTAZ)  IV 1 g (07/21/18 2336)   PRN Meds:.sodium chloride, acetaminophen **OR** acetaminophen, albuterol, HYDROcodone-acetaminophen, metoprolol tartrate, ondansetron **OR** ondansetron (ZOFRAN) IV, RESOURCE THICKENUP CLEAR, sodium chloride flush   PHYSICAL EXAM: Vital signs: Vitals:   07/21/18 2111 07/21/18 2118 07/22/18 0103 07/22/18 0412  BP: 130/82   124/82  Pulse: 94   92  Resp: 18   18  Temp: 98.6 F (37 C)   98.4 F (36.9 C)  TempSrc: Oral   Oral  SpO2: 98% 98%  100%  Weight:   70.3 kg   Height:       Filed Weights   07/20/18 1320 07/20/18 1700 07/22/18 0103  Weight: 72.1 kg 70.3 kg 70.3 kg   Body mass index is 27.45 kg/m.   General appearance:Awake, alert, not in any distress.  Eyes:no scleral icterus. HEENT: Atraumatic and Normocephalic Neck: supple, no  JVD. Resp:Good air entry bilaterally,no rales or rhonchi CVS: S1 S2 regular, no murmurs.  GI: Bowel sounds present, Non tender and not distended with no gaurding, rigidity or rebound. Extremities: B/L Lower Ext shows no edema, both legs are warm to touch Neurology:  Non focal  I have personally reviewed following labs and imaging studies  LABORATORY DATA: CBC: Recent Labs  Lab 07/16/18 0430 07/17/18 0416 07/18/18 0341 07/20/18 0400 07/21/18 0426  WBC 2.8* 3.1* 6.9 5.2 5.1  NEUTROABS 2.0 2.1 5.7  --   --   HGB 11.3* 11.8* 12.2* 11.7* 12.4*  HCT 35.4* 37.3* 38.7* 35.8* 39.1  MCV 90.1 89.9 89.6 90.2 90.7  PLT 89* 103* 127* 156 117*    Basic Metabolic Panel: Recent Labs  Lab 07/16/18 0430 07/17/18 0416 07/18/18 0341 07/19/18 1148 07/20/18 0400 07/21/18 0426 07/22/18 0523  NA 139 139 140 140 138 138 137  K 3.7 3.5 3.7 3.3* 4.0 3.9 3.9  CL 104 103 105 106 104 101 102  CO2 22 23 25 24 24 27 24   GLUCOSE 135* 114* 93 113* 221* 90 146*  BUN 87* 95* 48* 59* 65* 33* 57*  CREATININE 5.94* 6.00* 3.74* 4.61* 4.95* 3.91* 5.88*  CALCIUM 6.4* 6.4* 7.0* 6.8* 7.0* 7.0* 6.8*  MG 2.2 2.1 1.9  --   --   --   --   PHOS  --   --   --   --   --  3.4 4.3    GFR: Estimated Creatinine Clearance: 10.9 mL/min (A) (by C-G formula based on SCr of 5.88 mg/dL (H)).  Liver Function Tests: Recent Labs  Lab 07/16/18 0430 07/21/18 0426 07/22/18 0523  AST 31  --   --   ALT 36  --   --   ALKPHOS 62  --   --   BILITOT 0.5  --   --   PROT 5.5*  --   --   ALBUMIN 2.0* 2.1* 2.0*   No results for input(s): LIPASE, AMYLASE in the last 168 hours. No results for input(s): AMMONIA in the last 168 hours.  Coagulation Profile: Recent Labs  Lab 07/18/18 0341 07/19/18 0500 07/20/18 0400 07/21/18 0426 07/22/18 0523  INR 1.12 1.17 1.28 1.17 1.45    Cardiac Enzymes:  No results for input(s): CKTOTAL, CKMB, CKMBINDEX, TROPONINI in the last 168 hours.  BNP (last 3 results) No results for  input(s): PROBNP in the last 8760 hours.  HbA1C: No results for input(s): HGBA1C in the last 72 hours.  CBG: Recent Labs  Lab 07/21/18 0604 07/21/18 1205 07/21/18 1729 07/22/18 0835 07/22/18 1202  GLUCAP 78 259* 299* 113* 248*    Lipid Profile: No results for input(s): CHOL, HDL, LDLCALC, TRIG, CHOLHDL, LDLDIRECT in the last 72 hours.  Thyroid Function Tests: No results for input(s): TSH, T4TOTAL, FREET4, T3FREE, THYROIDAB in the last 72 hours.  Anemia Panel: No results for input(s): VITAMINB12, FOLATE, FERRITIN, TIBC, IRON, RETICCTPCT in the last 72 hours.  Urine analysis:    Component Value Date/Time   COLORURINE AMBER (A) 07/08/2018 0839   APPEARANCEUR CLOUDY (A) 07/08/2018 0839   LABSPEC 1.016 07/08/2018 0839   PHURINE 5.0 07/08/2018 0839   GLUCOSEU NEGATIVE 07/08/2018 0839   HGBUR SMALL (A) 07/08/2018 0839   BILIRUBINUR NEGATIVE 07/08/2018 0839   KETONESUR 5 (A) 07/08/2018 0839   PROTEINUR 100 (A) 07/08/2018 0839   UROBILINOGEN 0.2 10/23/2013 1545   NITRITE NEGATIVE 07/08/2018 0839   LEUKOCYTESUR LARGE (A) 07/08/2018 0839    Sepsis Labs: Lactic Acid, Venous    Component Value Date/Time   LATICACIDVEN 1.5 07/08/2018 1952    MICROBIOLOGY: No results found for this or any previous visit (from the past 240 hour(s)).  RADIOLOGY STUDIES/RESULTS: Ct Abdomen Pelvis Wo Contrast  Result Date: 07/12/2018 CLINICAL DATA:  Bacteremia EXAM: CT ABDOMEN AND PELVIS WITHOUT CONTRAST TECHNIQUE: Multidetector CT imaging of the abdomen and pelvis was performed following the standard protocol without IV contrast. COMPARISON:  None. FINDINGS: Lower chest: Lung bases demonstrate mild atelectatic changes without sizable effusion. Coronary calcifications are seen. Defibrillator is again noted. Hepatobiliary: No focal liver abnormality is seen. No gallstones, gallbladder wall thickening, or biliary dilatation. Pancreas: Unremarkable. No pancreatic ductal dilatation or surrounding  inflammatory changes. Spleen: Normal in size without focal abnormality. Adrenals/Urinary Tract: Adrenal glands are within normal limits. Kidneys are well visualized bilaterally. Right renal cyst is seen. A smaller left renal cyst is noted as well no renal calculi or obstructive changes are seen. The ureters are within normal limits bilaterally. The bladder is partially distended. Stomach/Bowel: Diverticular changes noted within the colon without evidence of diverticulitis. The appendix is within normal limits. No small bowel inflammatory changes are seen. Stomach is decompressed. Vascular/Lymphatic: Aortic atherosclerosis. No enlarged abdominal or pelvic lymph nodes. Reproductive: Prostate is unremarkable. Other: No abdominal wall hernia or abnormality. No abdominopelvic ascites. Musculoskeletal: Degenerative changes of the lumbar spine are seen. IMPRESSION: Diverticulosis without diverticulitis. Bilateral renal cysts. Mild bibasilar atelectasis. Electronically Signed   By: Inez Catalina M.D.   On: 07/12/2018 19:09   Dg Chest 1 View  Result Date: 07/07/2018 CLINICAL DATA:  68 year old male with history of tachycardia. EXAM: CHEST  1 VIEW COMPARISON:  Chest x-ray 07/06/2018. FINDINGS: Status post median sternotomy. External defibrillator pad projecting over the lower left hemithorax. Left-sided pacemaker/AICD with lead tip projecting over the expected location of the right ventricular apex. Lung volumes are slightly low. No acute consolidative airspace disease. Small left pleural effusion. No right pleural effusion. There is cephalization of the pulmonary vasculature and slight indistinctness of the interstitial markings suggestive of mild pulmonary edema. Mild cardiomegaly. The patient is rotated to the left on today's exam, resulting in distortion of the mediastinal contours and reduced diagnostic sensitivity and specificity for mediastinal pathology. Aortic atherosclerosis. IMPRESSION: 1.  Mild interstitial  pulmonary edema and small left pleural effusion in the setting of mild cardiomegaly; imaging findings concerning for congestive heart failure. 2. Aortic atherosclerosis. 3. Postoperative changes and support apparatus, as above. Electronically Signed   By: Vinnie Langton M.D.   On: 07/07/2018 11:54   Ct Head Wo Contrast  Result Date: 07/11/2018 CLINICAL DATA:  Initial evaluation for acute altered mental status, asymmetric left-sided weakness with left facial droop. EXAM: CT HEAD WITHOUT CONTRAST TECHNIQUE: Contiguous axial images were obtained from the base of the skull through the vertex without intravenous contrast. COMPARISON:  Prior CT from 07/06/2018. FINDINGS: Brain: Age advanced cerebral atrophy with chronic microvascular ischemic disease, stable. Remote right MCA and/or MCA/PCA watershed territory infarct involving the right temporal occipital region noted, stable. Multiple chronic lacunar infarcts seen involving the bilateral basal ganglia, thalami, pons, and cerebellum. Specifically, previously noted right basal ganglia lacunar infarcts are stable from previous exam, likely chronic. No acute intracranial hemorrhage. No new or evolving acute large vessel territory infarct. No mass lesion, midline shift or mass effect. No hydrocephalus. No extra-axial fluid collection. Vascular: No hyperdense vessel. Calcified atherosclerosis at the skull base. Skull: Scalp soft tissues and calvarium within normal limits. Sinuses/Orbits: Globes and orbital soft tissues within normal limits. Scattered mucosal thickening with superimposed air-fluid levels noted within the paranasal sinuses, worsened from previous. Mastoid air cells are clear. Other: None. IMPRESSION: 1. Stable head CT, with no new acute intracranial abnormality. 2. In band cerebral atrophy with chronic small vessel ischemic disease with multiple chronic ischemic infarcts as above. 3. Acute pan sinusitis, worsened from previous. Electronically Signed   By:  Jeannine Boga M.D.   On: 07/11/2018 17:20   Ct Head Wo Contrast  Result Date: 07/06/2018 CLINICAL DATA:  Altered mental status. History of HIV, hypertension, hypercholesterolemia and diabetes. EXAM: CT HEAD WITHOUT CONTRAST TECHNIQUE: Contiguous axial images were obtained from the base of the skull through the vertex without intravenous contrast. COMPARISON:  CT HEAD July 21, 2016 FINDINGS: BRAIN: No intraparenchymal hemorrhage, mass effect, midline shift or acute large vascular territory infarcts. Old small RIGHT cerebellar and pontine infarcts. RIGHT temporal occipital encephalomalacia. New subcentimeter RIGHT internal capsule versus basal ganglia hypodensities. Old RIGHT basal ganglia infarct with mild ex vacuo dilatation RIGHT lateral ventricle. Old small bilateral thalami and LEFT basal ganglia infarcts. Confluent supratentorial white matter hypodensities. Moderate parenchymal brain volume loss. No hydrocephalus. VASCULAR: Moderate calcific atherosclerosis of the carotid siphons. SKULL: No skull fracture. No significant scalp soft tissue swelling. SINUSES/ORBITS: Mild paranasal sinus mucosal thickening. Chronic dehiscence of the RIGHT posterolateral maxillary wall. Mastoid air cells are well aerated.The included ocular globes and orbital contents are non-suspicious. OTHER: Bilateral parotid sialoliths. IMPRESSION: 1. New age indeterminate RIGHT basal ganglia small infarcts. 2. Old RIGHT temporal occipital/PCA territory infarct. 3. Old basal ganglia, thalami, pontine and cerebellar small infarcts. Moderate to severe chronic small vessel ischemic changes. 4. Moderate parenchymal brain volume loss, advanced for age. Electronically Signed   By: Elon Alas M.D.   On: 07/06/2018 22:16   US Renal  Result Date: 07/07/2018 CLINICAL DATA:  Acute kidney injury, history cardiomyopathy, chronic systolic heart failure, stage III chronic kidney disease, hypertension, COPD, former smoker EXAM: RENAL /  URINARY TRACT ULTRASOUND COMPLETE COMPARISON:  09/05/2016 FINDINGS: Right Kidney: Renal measurements: 10.9.7 x 6.1 cm = volume: 200 mL. Cortical thinning. Increased cortical echogenicity. Tiny mid renal cyst 8 x 6.9 mm. Additional peripelvic cyst 3.0 x 3.2 x 3.1 cm, simple features. No additional mass or  hydronephrosis. No shadowing calculi. Left Kidney: Renal measurements: 12.3 x 6.6 x 6.6 cm = volume: 276 mL. Cortical thinning. Increased cortical echogenicity. Small cyst at inferior pole 1.0 x 0.9 x 1.2 cm. Additional small cyst at inferior pole 2.0 x 1.6 x 1.9 cm, simple features. No additional mass, hydronephrosis, or shadowing calcification. Bladder: Decompressed, unable to evaluate. IMPRESSION: Medical renal disease changes of both kidneys. Small renal cysts in both kidneys. No evidence of hydronephrosis. Electronically Signed   By: Lavonia Dana M.D.   On: 07/07/2018 19:30   Ir Fluoro Guide Cv Line Right  Result Date: 07/09/2018 INDICATION: Acute uremia, acute kidney injury, no current access for dialysis EXAM: Ultrasound guidance for vascular access Right IJ temporary dialysis catheter (Mahurkar catheter) MEDICATIONS: 1% lidocaine local ANESTHESIA/SEDATION: Moderate Sedation Time: None. The patient's level of consciousness and vital signs were monitored continuously by radiology nursing throughout the procedure under my direct supervision. FLUOROSCOPY TIME:  Fluoroscopy Time: 0 minutes 24 seconds (2 mGy). COMPLICATIONS: None immediate. PROCEDURE: Emergency consent was obtained from the patient's physician after a thorough discussion of the procedural risks, benefits and alternatives. All questions were addressed. Maximal Sterile Barrier Technique was utilized including caps, mask, sterile gowns, sterile gloves, sterile drape, hand hygiene and skin antiseptic. A timeout was performed prior to the initiation of the procedure. Under sterile conditions and local anesthesia, ultrasound micropuncture access  performed of the right internal jugular vein. Images obtained for documentation of the patent jugular vein. Guidewire advanced easily. Measurements obtained. Four [place] dilator advanced. Amplatz guidewire advanced into the IVC. Tract dilatation performed to insert a temporary dialysis catheter. Tip position at the SVC RA junction. Images obtained for documentation. Catheter secured Prolene sutures. Blood aspirated easily followed by saline and heparin flushes. Appropriate volume and strength of heparin instilled in all lumens. External caps applied. Catheter secured with Prolene sutures and a sterile dressing. No immediate complication. Patient tolerated the procedure well. IMPRESSION: Successful ultrasound and fluoroscopic right IJ temporary dialysis catheter. Tip SVC RA junction. Ready for use. Electronically Signed   By: Jerilynn Mages.  Shick M.D.   On: 07/09/2018 13:06   Ir US Guide Vasc Access Right  Result Date: 07/09/2018 INDICATION: Acute uremia, acute kidney injury, no current access for dialysis EXAM: Ultrasound guidance for vascular access Right IJ temporary dialysis catheter (Mahurkar catheter) MEDICATIONS: 1% lidocaine local ANESTHESIA/SEDATION: Moderate Sedation Time: None. The patient's level of consciousness and vital signs were monitored continuously by radiology nursing throughout the procedure under my direct supervision. FLUOROSCOPY TIME:  Fluoroscopy Time: 0 minutes 24 seconds (2 mGy). COMPLICATIONS: None immediate. PROCEDURE: Emergency consent was obtained from the patient's physician after a thorough discussion of the procedural risks, benefits and alternatives. All questions were addressed. Maximal Sterile Barrier Technique was utilized including caps, mask, sterile gowns, sterile gloves, sterile drape, hand hygiene and skin antiseptic. A timeout was performed prior to the initiation of the procedure. Under sterile conditions and local anesthesia, ultrasound micropuncture access performed of the  right internal jugular vein. Images obtained for documentation of the patent jugular vein. Guidewire advanced easily. Measurements obtained. Four [place] dilator advanced. Amplatz guidewire advanced into the IVC. Tract dilatation performed to insert a temporary dialysis catheter. Tip position at the SVC RA junction. Images obtained for documentation. Catheter secured Prolene sutures. Blood aspirated easily followed by saline and heparin flushes. Appropriate volume and strength of heparin instilled in all lumens. External caps applied. Catheter secured with Prolene sutures and a sterile dressing. No immediate complication. Patient tolerated the procedure  well. IMPRESSION: Successful ultrasound and fluoroscopic right IJ temporary dialysis catheter. Tip SVC RA junction. Ready for use. Electronically Signed   By: Jerilynn Mages.  Shick M.D.   On: 07/09/2018 13:06   Dg Chest Port 1 View  Result Date: 07/20/2018 CLINICAL DATA:  Placement of right IJ dialysis catheter. EXAM: PORTABLE CHEST 1 VIEW COMPARISON:  07/12/2010 FINDINGS: The prior IJ catheter was removed. There is a new double lumen dialysis catheter in place. The distal tip is in the region of the right atrium and the proximal tip is likely near the cavoatrial junction. No complicating features. The right ventricular pacer wire is stable. Stable surgical changes from bypass surgery. The heart remains enlarged. There is tortuosity and calcification of the thoracic aorta. Mild vascular congestion without overt pulmonary edema. No pleural effusions or pneumothorax. IMPRESSION: Right IJ dialysis catheter in good position without complicating features. Cardiac enlargement and vascular congestion without overt pulmonary edema. Electronically Signed   By: Marijo Sanes M.D.   On: 07/20/2018 10:53   Dg Chest Port 1 View  Result Date: 07/12/2018 CLINICAL DATA:  Wheezing EXAM: PORTABLE CHEST 1 VIEW COMPARISON:  07/11/2018 FINDINGS: Right IJ approach hemodialysis catheter tip is  in the right atrium. Unchanged position of left chest wall single lead AICD. Mild interstitial pulmonary edema. Mild cardiomegaly with findings of prior CABG. Trace left pleural effusion. IMPRESSION: Mild cardiomegaly and mild interstitial pulmonary edema. Electronically Signed   By: Ulyses Jarred M.D.   On: 07/12/2018 04:32   Dg Chest Port 1 View  Result Date: 07/11/2018 CLINICAL DATA:  Shortness of breath EXAM: PORTABLE CHEST 1 VIEW COMPARISON:  07/07/2018 FINDINGS: Left chest wall AICD lead is in unchanged position. There is mild cardiomegaly. Central pulmonary vascular congestion without overt pulmonary edema. No focal airspace consolidation. Small left pleural effusion. No pneumothorax. IMPRESSION: Cardiomegaly and small left pleural effusion without overt pulmonary edema. Electronically Signed   By: Ulyses Jarred M.D.   On: 07/11/2018 03:35   Dg Chest Port 1 View  Result Date: 07/06/2018 CLINICAL DATA:  Sepsis EXAM: PORTABLE CHEST 1 VIEW COMPARISON:  Portable exam 2023 hours compared to 12/09/2016 FINDINGS: LEFT subclavian AICD with lead projecting over RIGHT ventricle. External pacing lead present. Enlargement of cardiac silhouette with postsurgical changes of CABG. Atherosclerotic calcification aorta. Mediastinal contours normal. Subsegmental atelectasis LEFT lower lobe. Pulmonary vascular congestion with interstitial prominence at the mid to lower lungs which could represent minimal pulmonary edema. No pleural effusion or pneumothorax. Bones demineralized. IMPRESSION: Enlargement of cardiac silhouette with vascular congestion and question minimal pulmonary edema. Electronically Signed   By: Lavonia Dana M.D.   On: 07/06/2018 20:35   Dg Swallowing Func-speech Pathology  Result Date: 07/14/2018 Objective Swallowing Evaluation: Type of Study: MBS-Modified Barium Swallow Study  Patient Details Name: Keith Young MRN: [id number] Date of Birth: 1951/05/28 Today's Date: 07/14/2018 Time: SLP Start Time  (ACUTE ONLY): 70 -SLP Stop Time (ACUTE ONLY): 1150 SLP Time Calculation (min) (ACUTE ONLY): 20 min Past Medical History: Past Medical History: Diagnosis Date . AICD (automatic cardioverter/defibrillator) present  . Alcoholism in remission ([place])  . Anemia  . Aortic insufficiency  . Arthritis  . At moderate risk for fall  . Bell's palsy  . Chronic systolic heart failure (HCC)   NYHA Class III . CKD (chronic kidney disease), stage IV ([place])  . COPD (chronic obstructive pulmonary disease) ([place])  . Essential hypertension, benign  . HIV disease ([place]) 09/06/2016 . Hyperlipidemia  . NICM (nonischemic cardiomyopathy) ([place])  . Noncompliance  with medication regimen  . NSVT (nonsustained ventricular tachycardia) ([place])  . Numbness of right jaw   Had a stoke in 01/2013. Numbness is occasional, especially when trying to chew. . OSA (obstructive sleep apnea)  . Productive cough 08/2013  With brown sputum  . S/P aortic valve replacement with bioprosthetic valve  . Stroke ([place]) 01/2013  weakness of right side from CVA . Type 2 diabetes mellitus ([place])  . Uses hearing aid 2014  recently received new hearing aids Past Surgical History: Past Surgical History: Procedure Laterality Date . BIOPSY N/A 04/10/2014  Procedure: GASTRIC BIOPSY;  Surgeon: Daneil Dolin, MD;  Location: [facility];  Service: Endoscopy;  Laterality: N/A; . BIOPSY  10/12/2015  Procedure: BIOPSY;  Surgeon: Daneil Dolin, MD;  Location: [facility] SU[facility];  Service: Endoscopy;;  stomach bx's . CARDIAC DEFIBRILLATOR PLACEMENT  11/12/12  [employer] Inogen MINI ICD implanted in [place] at [place] AV[facility] per Dr Nelly Laurence note . COLONOSCOPY WITH PROPOFOL N/A 04/10/2014  RMR: Colonic Diverticulosis . ESOPHAGOGASTRODUODENOSCOPY (EGD) WITH PROPOFOL N/A 04/10/2014  RMR: Mild erosive reflux esophagitis. Multiple antral polyps likely hyperplastic status post removal by hot snare cautery technique. Diffusely abnormal stomach status post  gastric biopsy I suspect some of patients anemaia may be due to intermittent oozing from the stomach. It would be difficult and a risky proposition to attempt complete removal of all of his gastric polyps. . ESOPHAGOGASTRODUODENOSCOPY (EGD) WITH PROPOFOL N/A 10/12/2015  Procedure: ESOPHAGOGASTRODUODENOSCOPY (EGD) WITH PROPOFOL;  Surgeon: Daneil Dolin, MD;  Location: [facility];  Service: Endoscopy;  Laterality: N/A;  8756 - moved to 9:15 . IR FLUORO GUIDE CV LINE RIGHT  07/09/2018 . IR US GUIDE VASC ACCESS RIGHT  07/09/2018 . POLYPECTOMY N/A 04/10/2014  Procedure: GASTRIC POLYPECTOMY;  Surgeon: Daneil Dolin, MD;  Location: [facility];  Service: Endoscopy;  Laterality: N/A; . RIGHT HEART CATHETERIZATION N/A 02/06/2014  Procedure: RIGHT HEART CATH;  Surgeon: Larey Dresser, MD;  Location: [facility] CATH LAB;  Service: Cardiovascular;  Laterality: N/A; HPI: CORDARREL STIEFEL is a 68 y.o. male with PMH significant of HIV, type 2 diabetes, COPD, CVA (43329, HTN, Bells Palsy, systolic CHF, CKD, and history of AICD placement followed by cardiology.  Pt was admitted to [facility] ED 07/07/18 and after presenting with AMS at SNF residence, vomitted, and became increasingly unresponsive. Per PA note, pt was found to be encephalopathic secondary to sepsis and renal failure. Pt seen by ST in Jan 2018; BSE recc D2/thin. CXR showed cardiomegaly and small left pleural effusion. Head CT revealed new age indeterminate RIGHT basal ganglia small infarcts, old RIGHT temporal occipital/PCA territory infarct, old basal ganglia, thalami, pontine and cerebellar small infarcts.  Subjective: alert, cooperative Assessment / Plan / Recommendation CHL IP CLINICAL IMPRESSIONS 07/14/2018 Clinical Impression Pt presents with primary moderate oral dysphagia, mild pharyngeal dysphagia. There is lingual pumping, decreased bolus cohesion, piecemeal deglutition and premature spillage (to pyriforms with liquids, valleculae with solids). Premature spillage results in  intermittent deep larygneal penetration of thin, nectar, and honey thick liquids, with aspiration (sensed and silent) of thin and nectar. Due to impulsivity pt at risk for aspiration with liquids of any consistency. With solids, pt without rotary chew, lingual thrusting noted with poor cohesion and spillage of solids to the valleculae. SLP used straw to facilitate chin tuck, which prevented aspiration with thin liquids, including small and large sips and consecutive boluses. There was trace shallow penetration with chin tuck which  reversed with swallow. Brief stasis of barium tablet in mid-thoracic esophagus, cleared pt's GE junction with liquid wash. Recommend dys 2, thin liquids, meds whole in puree; pt must tuck chin when swallowing for airway protection. Recommend full supervision due to level of cuing necessary. SLP will follow for tolerance and training in swallow precautions/compensations.  SLP Visit Diagnosis Dysphagia, oral phase (R13.11);Dysphagia, pharyngeal phase (R13.13) Attention and concentration deficit following -- Frontal lobe and executive function deficit following -- Impact on safety and function Moderate aspiration risk   CHL IP TREATMENT RECOMMENDATION 07/14/2018 Treatment Recommendations Therapy as outlined in treatment plan below   Prognosis 07/14/2018 Prognosis for Safe Diet Advancement Good Barriers to Reach Goals Cognitive deficits Barriers/Prognosis Comment -- CHL IP DIET RECOMMENDATION 07/14/2018 SLP Diet Recommendations Dysphagia 2 (Fine chop) solids;Thin liquid Liquid Administration via Straw Medication Administration Whole meds with puree Compensations Slow rate;Small sips/bites;Chin tuck;Clear throat intermittently;Use straw to facilitate chin tuck Postural Changes --   CHL IP OTHER RECOMMENDATIONS 07/14/2018 Recommended Consults -- Oral Care Recommendations Oral care BID Other Recommendations --   CHL IP FOLLOW UP RECOMMENDATIONS 07/14/2018 Follow up Recommendations Skilled Nursing  facility   [facility] IP FREQUENCY AND DURATION 07/14/2018 Speech Therapy Frequency (ACUTE ONLY) min 2x/week Treatment Duration 2 weeks      CHL IP ORAL PHASE 07/14/2018 Oral Phase Impaired Oral - Pudding Teaspoon -- Oral - Pudding Cup -- Oral - Honey Teaspoon NT Oral - Honey Cup Weak lingual manipulation;Lingual pumping;Reduced posterior propulsion;Piecemeal swallowing;Decreased bolus cohesion;Premature spillage Oral - Nectar Teaspoon Weak lingual manipulation;Lingual pumping;Reduced posterior propulsion;Piecemeal swallowing;Decreased bolus cohesion;Premature spillage Oral - Nectar Cup Weak lingual manipulation;Lingual pumping;Reduced posterior propulsion;Piecemeal swallowing;Decreased bolus cohesion;Premature spillage Oral - Nectar Straw Weak lingual manipulation;Lingual pumping;Reduced posterior propulsion;Piecemeal swallowing;Delayed oral transit;Decreased bolus cohesion Oral - Thin Teaspoon Lingual pumping;Reduced posterior propulsion;Piecemeal swallowing;Decreased bolus cohesion;Premature spillage Oral - Thin Cup Weak lingual manipulation;Lingual pumping;Reduced posterior propulsion;Piecemeal swallowing;Decreased bolus cohesion;Premature spillage Oral - Thin Straw Weak lingual manipulation;Reduced posterior propulsion;Piecemeal swallowing;Decreased bolus cohesion;Premature spillage Oral - Puree Weak lingual manipulation;Lingual pumping;Reduced posterior propulsion;Piecemeal swallowing;Decreased bolus cohesion;Premature spillage Oral - Mech Soft Impaired mastication;Weak lingual manipulation;Lingual pumping;Reduced posterior propulsion;Lingual/palatal residue;Piecemeal swallowing;Delayed oral transit;Premature spillage;Decreased bolus cohesion Oral - Regular -- Oral - Multi-Consistency -- Oral - Pill Other (Comment) Oral Phase - Comment --  CHL IP PHARYNGEAL PHASE 07/14/2018 Pharyngeal Phase Impaired Pharyngeal- Pudding Teaspoon -- Pharyngeal -- Pharyngeal- Pudding Cup -- Pharyngeal -- Pharyngeal- Honey Teaspoon --  Pharyngeal -- Pharyngeal- Honey Cup Penetration/Aspiration before swallow;Penetration/Aspiration during swallow Pharyngeal Material enters airway, remains ABOVE vocal cords then ejected out;Material enters airway, remains ABOVE vocal cords and not ejected out Pharyngeal- Nectar Teaspoon -- Pharyngeal -- Pharyngeal- Nectar Cup Penetration/Aspiration before swallow;Penetration/Aspiration during swallow;Trace aspiration Pharyngeal Material does not enter airway;Material enters airway, CONTACTS cords and not ejected out;Material enters airway, passes BELOW cords without attempt by patient to eject out (silent aspiration) Pharyngeal- Nectar Straw Penetration/Aspiration before swallow;Penetration/Aspiration during swallow;Trace aspiration Pharyngeal Material enters airway, CONTACTS cords and not ejected out;Material enters airway, passes BELOW cords without attempt by patient to eject out (silent aspiration) Pharyngeal- Thin Teaspoon Penetration/Aspiration before swallow;Penetration/Aspiration during swallow;Moderate aspiration Pharyngeal Material enters airway, passes BELOW cords and not ejected out despite cough attempt by patient Pharyngeal- Thin Cup Penetration/Aspiration before swallow;Penetration/Aspiration during swallow;Moderate aspiration Pharyngeal Material does not enter airway;Material enters airway, CONTACTS cords and not ejected out;Material enters airway, passes BELOW cords and not ejected out despite cough attempt by patient;Material enters airway, passes BELOW cords without attempt by patient to eject out (silent aspiration) Pharyngeal- Thin Straw  Penetration/Aspiration during swallow;Other (Comment) Pharyngeal Material does not enter airway;Material enters airway, remains ABOVE vocal cords then ejected out Pharyngeal- Puree WFL Pharyngeal -- Pharyngeal- Mechanical Soft WFL Pharyngeal -- Pharyngeal- Regular -- Pharyngeal -- Pharyngeal- Multi-consistency -- Pharyngeal -- Pharyngeal- Pill Other (Comment)  Pharyngeal -- Pharyngeal Comment --  CHL IP CERVICAL ESOPHAGEAL PHASE 07/14/2018 Cervical Esophageal Phase WFL Pudding Teaspoon -- Pudding Cup -- Honey Teaspoon -- Honey Cup -- Nectar Teaspoon -- Nectar Cup -- Nectar Straw -- Thin Teaspoon -- Thin Cup -- Thin Straw -- Puree -- Mechanical Soft -- Regular -- Multi-consistency -- Pill -- Cervical Esophageal Comment -- Deneise Lever, MS, CCC-SLP Speech-Language Pathologist Acute Rehabilitation Services Pager: [phone] Office: [phone] Aliene Altes 07/14/2018, 1:46 PM              Dg Cyndy Freeze Guide Cv Line-no Report  Result Date: 07/20/2018 Fluoroscopy was utilized by the requesting physician.  No radiographic interpretation.   Vas [place] Upper Ext Vein Mapping (pre-op Avf)  Result Date: 07/18/2018 UPPER EXTREMITY VEIN MAPPING  Indications: Pre-access. Performing Technologist: Abram Sander RVS  Examination Guidelines: A complete evaluation includes B-mode imaging, spectral Doppler, color Doppler, and power Doppler as needed of all accessible portions of each vessel. Bilateral testing is considered an integral part of a complete examination. Limited examinations for reoccurring indications may be performed as noted. +-----------------+-------------+----------+--------------+ Right Cephalic   Diameter (cm)Depth (cm)   Findings    +-----------------+-------------+----------+--------------+ Shoulder             0.50        1.23                  +-----------------+-------------+----------+--------------+ Prox upper arm       0.55        0.84                  +-----------------+-------------+----------+--------------+ Mid upper arm        0.53        0.27                  +-----------------+-------------+----------+--------------+ Dist upper arm       0.41        0.41                  +-----------------+-------------+----------+--------------+ Antecubital fossa    0.50        0.29                   +-----------------+-------------+----------+--------------+ Prox forearm         0.24        0.22     branching    +-----------------+-------------+----------+--------------+ Mid forearm                             not visualized +-----------------+-------------+----------+--------------+ Dist forearm                            not visualized +-----------------+-------------+----------+--------------+ +-----------------+-------------+----------+--------+ Right Basilic    Diameter (cm)Depth (cm)Findings +-----------------+-------------+----------+--------+ Prox upper arm       0.36        1.36            +-----------------+-------------+----------+--------+ Mid upper arm        0.44        1.66            +-----------------+-------------+----------+--------+ Dist upper arm  0.56        0.95            +-----------------+-------------+----------+--------+ Antecubital fossa    0.64        0.50            +-----------------+-------------+----------+--------+ Prox forearm         0.29        0.21            +-----------------+-------------+----------+--------+ Mid forearm          0.32        0.20            +-----------------+-------------+----------+--------+ Distal forearm       0.30        0.20            +-----------------+-------------+----------+--------+ +-----------------+-------------+----------+---------+ Left Cephalic    Diameter (cm)Depth (cm)Findings  +-----------------+-------------+----------+---------+ Shoulder             0.40        0.98             +-----------------+-------------+----------+---------+ Prox upper arm       0.43        0.62             +-----------------+-------------+----------+---------+ Mid upper arm        0.41        0.36             +-----------------+-------------+----------+---------+ Dist upper arm       0.39        0.37             +-----------------+-------------+----------+---------+  Antecubital fossa    0.50        0.34   branching +-----------------+-------------+----------+---------+ Prox forearm         0.31        0.18             +-----------------+-------------+----------+---------+ Mid forearm          0.29        0.19             +-----------------+-------------+----------+---------+ Dist forearm         0.29        0.17             +-----------------+-------------+----------+---------+ +-----------------+-------------+----------+--------------+ Left Basilic     Diameter (cm)Depth (cm)   Findings    +-----------------+-------------+----------+--------------+ Prox upper arm       0.70        1.48                  +-----------------+-------------+----------+--------------+ Mid upper arm        0.57        1.30     branching    +-----------------+-------------+----------+--------------+ Dist upper arm                          not visualized +-----------------+-------------+----------+--------------+ Antecubital fossa                       not visualized +-----------------+-------------+----------+--------------+ Prox forearm                            not visualized +-----------------+-------------+----------+--------------+ Mid forearm                             not visualized +-----------------+-------------+----------+--------------+ Distal  forearm                          not visualized +-----------------+-------------+----------+--------------+ *See table(s) above for measurements and observations.  Diagnosing physician: Servando Snare MD Electronically signed by Servando Snare MD on 07/18/2018 at 5:32:40 PM.    Final    Dg Fluoro Guide Lumbar Puncture  Result Date: 07/10/2018 CLINICAL DATA:  68 year old male with encephalopathy. Subsequent encounter. EXAM: DIAGNOSTIC LUMBAR PUNCTURE UNDER FLUOROSCOPIC GUIDANCE FLUOROSCOPY TIME:  Fluoroscopy Time:  6 seconds PROCEDURE: Patient was not able to give consent. Family (sister) not  available by phone. Emergency request/consent from Dr. Candiss Norse. Labs and head CT reviewed. Under fluoroscopic guidance and aseptic technique (utilizing 1% lidocaine as local anesthetic), L3-4 lumbar puncture was performed with a single pass of a 22 gauge spinal needle. Opening pressure 26 and closing pressure 20 cc water (not felt to be accurate as patient was lying on the stomach and moving throughout the exam). 8.5 cc of clear cerebral spinal fluid collected and sent for labs. Patient tolerated procedure well without any difficulty. IMPRESSION: Successful L3-4 lumbar puncture as noted above. Electronically Signed   By: Genia Del M.D.   On: 07/10/2018 15:12     LOS: 15 days   Oren Binet, MD  [facility]  If 7PM-7AM, please contact night-coverage  Please page via [email or web address]  Go to [email or web address] and use [place]'s universal password to access. If you do not have the password, please contact the hospital operator.  Locate the [facility] provider you are looking for under [facility] and page to a number that you can be directly reached. If you still have difficulty reaching the provider, please page the [place] (Director on Call) for the Hospitalists listed on amion for assistance.  07/22/2018, 1:50 PM

## 2018-07-22 NOTE — Progress Notes (Signed)
[facility]   SUBJECTIVE:    No interval events  Patient without complaints  0.5 L urine output yesterday   OBJECTIVE:  Vitals:   07/21/18 2118 07/22/18 0412  BP:  124/82  Pulse:  92  Resp:  18  Temp:  98.4 F (36.9 C)  SpO2: 98% 100%    Intake/Output Summary (Last 24 hours) at 07/22/2018 1124 Last data filed at 07/22/2018 0700 Gross per 24 hour  Intake 236 ml  Output 501 ml  Net -265 ml      Genearl: comfortably laying at 30 deg, answers simple questions. Neck:  RIJ tunneled cath c/d/i CV:  RRR  No rub Lungs CTAB Abd:  abd SNT/ND with normal BS GU:  Bladder non-palpable, Foley catheter in place Extremities:  No LE edema. Skin:  No skin rash Left upper arm AVG +B/T   MEDICATIONS:  . bictegravir-emtricitabine-tenofovir AF  1 tablet Oral Daily  . Chlorhexidine Gluconate Cloth  6 each Topical Q0600  . feeding supplement (ENSURE ENLIVE)  237 mL Oral BID BM  . insulin aspart  0-9 Units Subcutaneous TID WC  . mouth rinse  15 mL Mouth Rinse BID  . metoprolol succinate  25 mg Oral Daily  . mometasone-formoterol  2 puff Inhalation BID  . multivitamin  1 tablet Oral QHS  . predniSONE  40 mg Oral Q breakfast  . warfarin  5 mg Oral ONCE-1800  . warfarin   Does not apply Once  . Warfarin - Pharmacist Dosing Inpatient   Does not apply q1800       LABS:   CBC Latest Ref Rng & Units 07/21/2018 07/20/2018 07/18/2018  WBC 4.0 - 10.5 K/uL 5.1 5.2 6.9  Hemoglobin 13.0 - 17.0 g/dL 12.4(L) 11.7(L) 12.2(L)  Hematocrit 39.0 - 52.0 % 39.1 35.8(L) 38.7(L)  Platelets 150 - 400 K/uL 117(L) 156 127(L)    CMP Latest Ref Rng & Units 07/22/2018 07/21/2018 07/20/2018  Glucose 70 - 99 mg/dL 146(H) 90 221(H)  BUN 8 - 23 mg/dL 57(H) 33(H) 65(H)  Creatinine 0.61 - 1.24 mg/dL 5.88(H) 3.91(H) 4.95(H)  Sodium 135 - 145 mmol/L 137 138 138  Potassium 3.5 - 5.1 mmol/L 3.9 3.9 4.0  Chloride 98 - 111 mmol/L 102 101 104  CO2 22 - 32 mmol/L 24 27 24   Calcium 8.9 - 10.3 mg/dL  6.8(L) 7.0(L) 7.0(L)  Total Protein 6.5 - 8.1 g/dL - - -  Total Bilirubin 0.3 - 1.2 mg/dL - - -  Alkaline Phos 38 - 126 U/L - - -  AST 15 - 41 U/L - - -  ALT 0 - 44 U/L - - -    Lab Results  Component Value Date   PTH 52 04/17/2018   CALCIUM 6.8 (L) 07/22/2018   CAION 1.27 (H) 11/28/2015   PHOS 4.3 07/22/2018       Component Value Date/Time   COLORURINE AMBER (A) 07/08/2018 0839   APPEARANCEUR CLOUDY (A) 07/08/2018 0839   LABSPEC 1.016 07/08/2018 0839   PHURINE 5.0 07/08/2018 0839   GLUCOSEU NEGATIVE 07/08/2018 0839   HGBUR SMALL (A) 07/08/2018 0839   BILIRUBINUR NEGATIVE 07/08/2018 0839   KETONESUR 5 (A) 07/08/2018 0839   PROTEINUR 100 (A) 07/08/2018 0839   UROBILINOGEN 0.2 10/23/2013 1545   NITRITE NEGATIVE 07/08/2018 0839   LEUKOCYTESUR LARGE (A) 07/08/2018 0839      Component Value Date/Time   PHART 7.346 (L) 07/08/2018 1235   PCO2ART 32.0 07/08/2018 1235   PO2ART 86.4 07/08/2018 1235   HCO3  17.0 (L) 07/08/2018 1235   TCO2 25 11/28/2015 1304   ACIDBASEDEF 7.5 (H) 07/08/2018 1235   O2SAT 95.9 07/08/2018 1235       Component Value Date/Time   IRON 40 (L) 04/17/2018 0734   TIBC 260 04/17/2018 0734   FERRITIN 39 04/17/2018 0734   IRONPCTSAT 15 (L) 04/17/2018 0734       ASSESSMENT/PLAN:      1.  Acute on chronic stage IV renal disease; probable baseline renal function 2.4-2.8 and etiology is hypertension, diabetes.   Acute insult likely ATN during E. coli sepsis.  Started HD 2/10.  Remains dialysis dependent.  No clear evidence of recovery. S/p R IJ TDC and LUE AVG 07/20/18.   I think at this poiont after 2wk HD dependence reasonable to assume he is ESRD. HD tomorrow, f/u on CLIPl  3K, 2L UF, 4h, TDC, Tight heparin.   2.  Sepsis secondary to E coli in urine and blood.  Continue antibiotics per ID - plan 2 wk course ending 2/23.  TEE Ok.  CT A/P showed diverticula.  Blood cultures negative from 2/10  3.  Metabolic encephalopathy.  Likely secondary to sepsis  complicated by uremia.  LP not c/w infection. Is awake and alert but not very aware of current circumstances, not sure his baseline prior to admission.   4.  Hyperkalemia.  resolved  5.  Bioprosthetic AVR, Chronic dual HF with EF , A fib with RVR:  Cardiology following.  On warfarin  6. HIV: on biktarvy   Pearson Grippe MD

## 2018-07-22 NOTE — Progress Notes (Addendum)
ANTICOAGULATION CONSULT NOTE - Follow Up Consult  Pharmacy Consult for Coumadin Indication: atrial fibrillation  No Known Allergies  Patient Measurements: Height: 5\' 3"  (160 cm) Weight: 154 lb 15.7 oz (70.3 kg) IBW/kg (Calculated) : 56.9  Vital Signs: Temp: 98.4 F (36.9 C) (02/23 0412) Temp Source: Oral (02/23 0412) BP: 124/82 (02/23 0412) Pulse Rate: 92 (02/23 0412)  Labs: Recent Labs    07/20/18 0400 07/21/18 0426 07/22/18 0523  HGB 11.7* 12.4*  --   HCT 35.8* 39.1  --   PLT 156 117*  --   LABPROT 15.9* 14.8 17.5*  INR 1.28 1.17 1.45  CREATININE 4.95* 3.91* 5.88*    Estimated Creatinine Clearance: 10.9 mL/min (A) (by C-G formula based on SCr of 5.88 mg/dL (H)).  Assessment: No anticoagulation PTA. s/p IV heparin for new onset AFib with RVR, CHA2DS2-Vasc score 5 >>stopped due to thrombocytopenia. Coumadin started 2/18. Hgb 11.7. Plts 156 rising. INR 1.28 with Coum on hold 2/20 for HD catheter replacement. Restarted warfarin 2/21 at 5mg  x1  2/22 AM: INR this morning subtherapeutic at 1.45 but slowly increasing after 5mg  dose yesterday. CBC stable and no bleeding noted.  Goal of Therapy:  INR 2-3 Monitor platelets by anticoagulation protocol: Yes   Plan:  - Continue Coumadin 5mg  po x 1 - Daily INR - f/u patient education when/if encephalopathy improved - final day of ceftazidime therapy for e coli bacteremia  Thank you for involving pharmacy in this patient's care.  Janae Bridgeman, PharmD PGY1 Pharmacy Resident Phone: [phone] 07/22/2018 7:55 AM

## 2018-07-23 ENCOUNTER — Encounter (HOSPITAL_COMMUNITY): Payer: Self-pay | Admitting: Vascular Surgery

## 2018-07-23 LAB — RENAL FUNCTION PANEL
Albumin: 2 g/dL — ABNORMAL LOW (ref 3.5–5.0)
Anion gap: 12 (ref 5–15)
BUN: 83 mg/dL — AB (ref 8–23)
CO2: 22 mmol/L (ref 22–32)
Calcium: 6.6 mg/dL — ABNORMAL LOW (ref 8.9–10.3)
Chloride: 103 mmol/L (ref 98–111)
Creatinine, Ser: 7.66 mg/dL — ABNORMAL HIGH (ref 0.61–1.24)
GFR calc Af Amer: 8 mL/min — ABNORMAL LOW (ref >60)
GFR, EST NON AFRICAN AMERICAN: 7 mL/min — AB (ref >60)
Glucose, Bld: 160 mg/dL — ABNORMAL HIGH (ref 70–99)
Phosphorus: 4.3 mg/dL (ref 2.5–4.6)
Potassium: 4.1 mmol/L (ref 3.5–5.1)
Sodium: 137 mmol/L (ref 135–145)

## 2018-07-23 LAB — CBC
HCT: 37.4 % — ABNORMAL LOW (ref 39.0–52.0)
Hemoglobin: 12 g/dL — ABNORMAL LOW (ref 13.0–17.0)
MCH: 28.9 pg (ref 26.0–34.0)
MCHC: 32.1 g/dL (ref 30.0–36.0)
MCV: 90.1 fL (ref 80.0–100.0)
Platelets: 121 10*3/uL — ABNORMAL LOW (ref 150–400)
RBC: 4.15 MIL/uL — ABNORMAL LOW (ref 4.22–5.81)
RDW: 14.6 % (ref 11.5–15.5)
WBC: 10.4 10*3/uL (ref 4.0–10.5)
nRBC: 0 % (ref 0.0–0.2)

## 2018-07-23 LAB — PROTIME-INR
INR: 1.63
Prothrombin Time: 19.2 seconds — ABNORMAL HIGH (ref 11.4–15.2)

## 2018-07-23 LAB — GLUCOSE, CAPILLARY
GLUCOSE-CAPILLARY: 110 mg/dL — AB (ref 70–99)
GLUCOSE-CAPILLARY: 163 mg/dL — AB (ref 70–99)
Glucose-Capillary: 147 mg/dL — ABNORMAL HIGH (ref 70–99)
Glucose-Capillary: 282 mg/dL — ABNORMAL HIGH (ref 70–99)

## 2018-07-23 MED ORDER — ALTEPLASE 2 MG IJ SOLR: 2.0000 mg | Freq: Once | INTRAMUSCULAR | Status: DC | PRN

## 2018-07-23 MED ORDER — HEPARIN SODIUM (PORCINE) 1000 UNIT/ML DIALYSIS: 1000.0000 [IU] | INTRAMUSCULAR | Status: DC | PRN

## 2018-07-23 MED ORDER — SODIUM CHLORIDE 0.9 % IV SOLN: 100.0000 mL | INTRAVENOUS | Status: DC | PRN

## 2018-07-23 MED ORDER — WARFARIN SODIUM 5 MG PO TABS
5.0000 mg | ORAL_TABLET | Freq: Once | ORAL | Status: AC
  Administered 2018-07-23: 5 mg via ORAL
  Filled 2018-07-23: qty 1

## 2018-07-23 MED ORDER — LIDOCAINE-PRILOCAINE 2.5-2.5 % EX CREA: 1.0000 "application " | TOPICAL_CREAM | CUTANEOUS | Status: DC | PRN

## 2018-07-23 MED ORDER — INSULIN ASPART 100 UNIT/ML ~~LOC~~ SOLN
4.0000 [IU] | Freq: Once | SUBCUTANEOUS | Status: AC
  Administered 2018-07-24: 4 [IU] via SUBCUTANEOUS

## 2018-07-23 MED ORDER — HEPARIN SODIUM (PORCINE) 1000 UNIT/ML IJ SOLN
INTRAMUSCULAR | Status: AC
  Filled 2018-07-23: qty 4

## 2018-07-23 MED ORDER — LIDOCAINE HCL (PF) 1 % IJ SOLN: 5.0000 mL | INTRAMUSCULAR | Status: DC | PRN

## 2018-07-23 MED ORDER — HEPARIN SODIUM (PORCINE) 1000 UNIT/ML DIALYSIS: 20.0000 [IU]/kg | INTRAMUSCULAR | Status: DC | PRN

## 2018-07-23 MED ORDER — PENTAFLUOROPROP-TETRAFLUOROETH EX AERO: 1.0000 "application " | INHALATION_SPRAY | CUTANEOUS | Status: DC | PRN

## 2018-07-23 NOTE — Progress Notes (Signed)
Patient ID: NASARIO CZERNIAK, male   DOB: May 01, 1951, 68 y.o.   MRN: [id number]   [facility] Progress Note    Subjective:   No complaints or issues overnight   Objective:   BP 121/82 (BP Location: Right Arm)   Pulse 89   Temp 97.9 F (36.6 C) (Oral)   Resp 18   Ht 5\' 3"  (1.6 m)   Wt 71.4 kg   SpO2 98%   BMI 27.88 kg/m   Intake/Output: I/O last 3 completed shifts: In: 200 [P.O.:200] Out: 451 [Urine:450; Stool:1]   Intake/Output this shift:  Total I/O In: 240 [P.O.:240] Out: -  Weight change: 1.1 kg  Physical Exam: Gen: NAD CVS: no rub Resp: cta Abd: benign Ext: no edema of lower extremities, left forearm AVG +t/b and edema, mildly tender  Labs: BMET Recent Labs  Lab 07/17/18 0416 07/18/18 0341 07/19/18 1148 07/20/18 0400 07/21/18 0426 07/22/18 0523  NA 139 140 140 138 138 137  K 3.5 3.7 3.3* 4.0 3.9 3.9  CL 103 105 106 104 101 102  CO2 23 25 24 24 27 24   GLUCOSE 114* 93 113* 221* 90 146*  BUN 95* 48* 59* 65* 33* 57*  CREATININE 6.00* 3.74* 4.61* 4.95* 3.91* 5.88*  ALBUMIN  --   --   --   --  2.1* 2.0*  CALCIUM 6.4* 7.0* 6.8* 7.0* 7.0* 6.8*  PHOS  --   --   --   --  3.4 4.3   CBC Recent Labs  Lab 07/17/18 0416 07/18/18 0341 07/20/18 0400 07/21/18 0426  WBC 3.1* 6.9 5.2 5.1  NEUTROABS 2.1 5.7  --   --   HGB 11.8* 12.2* 11.7* 12.4*  HCT 37.3* 38.7* 35.8* 39.1  MCV 89.9 89.6 90.2 90.7  PLT 103* 127* 156 117*    @IMGRELPRIORS @ Medications:    . bictegravir-emtricitabine-tenofovir AF  1 tablet Oral Daily  . Chlorhexidine Gluconate Cloth  6 each Topical Q0600  . feeding supplement (ENSURE ENLIVE)  237 mL Oral BID BM  . insulin aspart  0-9 Units Subcutaneous TID WC  . mouth rinse  15 mL Mouth Rinse BID  . metoprolol succinate  25 mg Oral Daily  . mometasone-formoterol  2 puff Inhalation BID  . multivitamin  1 tablet Oral QHS  . predniSONE  40 mg Oral Q breakfast  . warfarin   Does not apply Once  . Warfarin - Pharmacist Dosing  Inpatient   Does not apply C5852     Assessment/ Plan:   1. Sepsis due to E Coli Uti- per ID completed 2 week course. TEE negative, CT showed diverticuli, and blood cultures negative 07/09/18.  2. ESRD- initially with AKI/CKD stage 4 but has remained dialysis dependent since 07/09/18.  No real signs of recovery and had advanced CKD prior to admission.  For HD today and starting CLIP process 3. Anemia: Hgb >12, no ESA 4. CKD-MBD: ca/phos stable.  iPTH has been low in the past will recheck. 5. Nutrition:renal diet 6. L forearm avg- placed 07/20/18 remains edemetous, +T/B 7. Hypertension:stable 8. Metabolic encephalopathy- presumably due to combination of sepsis and uremia.  Slow mentation but alert and awake.  9. 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 Combined CHF cardiology following 11. A fib. 12. HIV on biktarvy 13. Severe Protein malnutrition- encourage protein intake 14. Disposition- cont with PT/OT for deconditioning and assessment of needs for discharge.  CLIP process underway  Donetta Potts, MD [place] Pager [phone] 07/23/2018, 12:07  PM

## 2018-07-23 NOTE — Progress Notes (Signed)
ANTICOAGULATION CONSULT NOTE - Follow Up Consult  Pharmacy Consult for Coumadin Indication: atrial fibrillation  No Known Allergies  Patient Measurements: Height: 5\' 3"  (160 cm) Weight: 156 lb 4.9 oz (70.9 kg) IBW/kg (Calculated) : 56.9  Vital Signs: Temp: 97.6 F (36.4 C) (02/24 1200) Temp Source: Oral (02/24 1200) BP: 110/73 (02/24 1230) Pulse Rate: 86 (02/24 1230)  Labs: Recent Labs    07/21/18 0426 07/22/18 0523 07/23/18 0347  HGB 12.4*  --   --   HCT 39.1  --   --   PLT 117*  --   --   LABPROT 14.8 17.5* 19.2*  INR 1.17 1.45 1.63  CREATININE 3.91* 5.88*  --     Estimated Creatinine Clearance: 10.9 mL/min (A) (by C-G formula based on SCr of 5.88 mg/dL (H)).  Assessment: No anticoagulation PTA. s/p IV heparin for new onset AFib with RVR, CHA2DS2-Vasc score 5 >>stopped due to thrombocytopenia. Coumadin started 2/18.  INR this morning subtherapeutic at 1.63 but  increasing at a steady rate after 5mg  dose yesterday. CBC stable and no bleeding noted.  Goal of Therapy:  INR 2-3 Monitor platelets by anticoagulation protocol: Yes   Plan:  - Continue Coumadin 5mg  po x 1 - Daily INR - f/u patient education when/if encephalopathy improved  Zalika Tieszen A. Levada Dy, PharmD, [place] Please utilize Amion for appropriate phone number to reach the unit pharmacist ([place])   07/23/2018 12:53 PM

## 2018-07-23 NOTE — Progress Notes (Addendum)
PROGRESS NOTE        PATIENT DETAILS Name: Keith Young Age: 68 y.o. Sex: male Date of Birth: 1951-01-15 Admit Date: 07/06/2018 Admitting Physician Etta Quill, DO XTG:GYIRS, Rene Kocher, MD  Brief Narrative: Patient is a 68 y.o. male with history of chronic systolic heart failure with AICD in place, bioprosthetic AVR, stage IV CKD, DM-2, COPD, HIV on antiretrovirals-presented with sepsis secondary to E. coli bacteremia and acute kidney injury.  See below for further details  Subjective: Awake-alert-no family at bedside.No chest pain or SOB.  Assessment/Plan: Sepsis secondary to E. coli bacteremia: Sepsis pathophysiology has resolved-evaluated by infectious disease, recommendations are for 2-week duration of ceftazidime-end date of February 23.  CT abdomen negative for any foci for bacteremia.  Acute metabolic encephalopathy: Multifactorial-secondary to sepsis and also from uremia due to worsening renal function.  Encephalopathy has markedly improved-although slow-he is able to answer all my questions. Underwent LP on 2/11-CSF not consistent with meningitis.  Acute kidney injury on stage IV CKD and now thought to have ESRD: AKI likely secondary to ATN in the setting of sepsis.  Has required intermittent HD-s/p right IJ tunneled HD catheter, and left upper extremity AV graft placement by V VS on 2/21.  No signs of recovery so far-nephrology feels like this is probably ESRD.  Awaiting clipping process-defer further HD to the nephrology service.    HIV: Continue antiretrovirals.  PAF: Rate controlled with metoprolol-INR still subtherapeutic but slowly increasing.  Continue Coumadin per pharmacy.   Thrombocytopenia: Secondary to sepsis-resolved.  Chronic systolic heart failure (EF 20-25% by TTE on 07/12/2018): Volume status is stable-diuresis with HD.  Bioprosthetic AVR: TTE on 2/13 without any obvious vegetation-repeat blood cultures on 2/10.  DM-2 with  hypoglycemia: CBGs stable-continue SSI.    Hypertension: Controlled-continue metoprolol.  Dysphagia: Speech therapy following-on dysphagia 2 diet.  Has prior history of CVA-along with profound deconditioning-may be contributing to oropharyngeal dysphagia.  Deconditioning/debility: Secondary to acute illness-PT evaluation completed-SNF on discharge  Non-severe (moderate) malnutrition in context of chronic illness   DVT Prophylaxis: SCD's  Code Status: Full code   Family Communication: None at bedside  Disposition Plan: Remain inpatient-SNF on discharge -but needs clipping process first.  Antimicrobial agents: Anti-infectives (From admission, onward)   Start     Dose/Rate Route Frequency Ordered Stop   07/20/18 0600  cefUROXime (ZINACEF) 1.5 g in sodium chloride 0.9 % 100 mL IVPB  Status:  Discontinued     1.5 g 200 mL/hr over 30 Minutes Intravenous On call to O.R. 07/19/18 1249 07/20/18 1155   07/12/18 2200  cefTAZidime (FORTAZ) 1 g in sodium chloride 0.9 % 100 mL IVPB     1 g 200 mL/hr over 30 Minutes Intravenous Every 24 hours 07/12/18 1449 07/22/18 2306   07/10/18 1430  vancomycin (VANCOCIN) IVPB 1000 mg/200 mL premix  Status:  Discontinued     1,000 mg 200 mL/hr over 60 Minutes Intravenous Every 48 hours 07/08/18 1421 07/09/18 0959   07/10/18 1000  cefTRIAXone (ROCEPHIN) 2 g in sodium chloride 0.9 % 100 mL IVPB  Status:  Discontinued     2 g 200 mL/hr over 30 Minutes Intravenous Every 24 hours 07/10/18 0942 07/12/18 1433   07/10/18 0847  cefTRIAXone (ROCEPHIN) 2 g in sodium chloride 0.9 % 100 mL IVPB  Status:  Discontinued     2 g 200  mL/hr over 30 Minutes Intravenous Every 24 hours 07/09/18 1004 07/10/18 0933   07/09/18 1400  acyclovir (ZOVIRAX) 745 mg in dextrose 5 % 150 mL IVPB  Status:  Discontinued     10 mg/kg  74.5 kg 164.9 mL/hr over 60 Minutes Intravenous Every 24 hours 07/08/18 1421 07/09/18 0959   07/09/18 1300  bictegravir-emtricitabine-tenofovir AF  (BIKTARVY) 50-200-25 MG per tablet 1 tablet     1 tablet Oral Daily 07/09/18 1012     07/08/18 2300  cefTRIAXone (ROCEPHIN) 2 g in sodium chloride 0.9 % 100 mL IVPB  Status:  Discontinued     2 g 200 mL/hr over 30 Minutes Intravenous Every 12 hours 07/08/18 1341 07/09/18 1004   07/08/18 1800  vancomycin (VANCOCIN) IVPB 1000 mg/200 mL premix  Status:  Discontinued     1,000 mg 200 mL/hr over 60 Minutes Intravenous Every 48 hours 07/06/18 2019 07/07/18 1137   07/08/18 1430  ampicillin (OMNIPEN) 2 g in sodium chloride 0.9 % 100 mL IVPB  Status:  Discontinued     2 g 300 mL/hr over 20 Minutes Intravenous Every 12 hours 07/08/18 1343 07/09/18 0959   07/08/18 1430  vancomycin (VANCOCIN) 1,500 mg in sodium chloride 0.9 % 500 mL IVPB     1,500 mg 250 mL/hr over 120 Minutes Intravenous  Once 07/08/18 1421 07/08/18 1723   07/08/18 1400  acyclovir (ZOVIRAX) 745 mg in dextrose 5 % 150 mL IVPB     10 mg/kg  74.5 kg 164.9 mL/hr over 60 Minutes Intravenous  Once 07/08/18 1345 07/09/18 1315   07/07/18 1800  ceFEPIme (MAXIPIME) 1 g in sodium chloride 0.9 % 100 mL IVPB  Status:  Discontinued     1 g 200 mL/hr over 30 Minutes Intravenous Every 24 hours 07/06/18 2019 07/07/18 1137   07/07/18 1145  cefTRIAXone (ROCEPHIN) 2 g in sodium chloride 0.9 % 100 mL IVPB  Status:  Discontinued     2 g 200 mL/hr over 30 Minutes Intravenous Every 24 hours 07/07/18 1137 07/08/18 1341   07/07/18 1000  bictegravir-emtricitabine-tenofovir AF (BIKTARVY) 50-200-25 MG per tablet 1 tablet  Status:  Discontinued     1 tablet Oral Daily 07/07/18 0058 07/07/18 1127   07/06/18 1900  ceFEPIme (MAXIPIME) 2 g in sodium chloride 0.9 % 100 mL IVPB     2 g 200 mL/hr over 30 Minutes Intravenous  Once 07/06/18 1859 07/06/18 2001   07/06/18 1900  vancomycin (VANCOCIN) IVPB 1000 mg/200 mL premix     1,000 mg 200 mL/hr over 60 Minutes Intravenous  Once 07/06/18 1859 07/06/18 2035      Procedures: 2/21>> right IJ tunneled HD catheter,  and AV fistula placement by vascular surgery 2/11>>LP 2/10>> IR guided HD catheter placement  CONSULTS:  cardiology, nephrology, neurology and vascular surgery  ID  Time spent: 15-minutes-Greater than 50% of this time was spent in counseling, explanation of diagnosis, planning of further management, and coordination of care.  MEDICATIONS: Scheduled Meds: . bictegravir-emtricitabine-tenofovir AF  1 tablet Oral Daily  . Chlorhexidine Gluconate Cloth  6 each Topical Q0600  . feeding supplement (ENSURE ENLIVE)  237 mL Oral BID BM  . insulin aspart  0-9 Units Subcutaneous TID WC  . mouth rinse  15 mL Mouth Rinse BID  . metoprolol succinate  25 mg Oral Daily  . mometasone-formoterol  2 puff Inhalation BID  . multivitamin  1 tablet Oral QHS  . predniSONE  40 mg Oral Q breakfast  . warfarin  5 mg Oral  ONCE-1800  . warfarin   Does not apply Once  . Warfarin - Pharmacist Dosing Inpatient   Does not apply q1800   Continuous Infusions: . sodium chloride 0 mL/hr at 07/15/18 0106  . sodium chloride    . sodium chloride     PRN Meds:.sodium chloride, sodium chloride, sodium chloride, acetaminophen **OR** acetaminophen, albuterol, alteplase, heparin, heparin, HYDROcodone-acetaminophen, lidocaine (PF), lidocaine-prilocaine, metoprolol tartrate, ondansetron **OR** ondansetron (ZOFRAN) IV, pentafluoroprop-tetrafluoroeth, RESOURCE THICKENUP CLEAR, RESOURCE THICKENUP CLEAR, sodium chloride flush   PHYSICAL EXAM: Vital signs: Vitals:   07/23/18 1212 07/23/18 1215 07/23/18 1230 07/23/18 1300  BP: 113/72 115/72 110/73 105/68  Pulse: 86 87 86 86  Resp:      Temp:      TempSrc:      SpO2:      Weight:      Height:       Filed Weights   07/22/18 0103 07/23/18 0046 07/23/18 1200  Weight: 70.3 kg 71.4 kg 70.9 kg   Body mass index is 27.69 kg/m.   General appearance:Awake, alert, not in any distress.  Eyes:no scleral icterus. HEENT: Atraumatic and Normocephalic Neck: supple, no  JVD. Resp:Good air entry bilaterally,no rales or rhonchi CVS: S1 S2 regular, no murmurs.  GI: Bowel sounds present, Non tender and not distended with no gaurding, rigidity or rebound. Extremities: B/L Lower Ext shows no edema, both legs are warm to touch Neurology:  Non focal  I have personally reviewed following labs and imaging studies  LABORATORY DATA: CBC: Recent Labs  Lab 07/17/18 0416 07/18/18 0341 07/20/18 0400 07/21/18 0426  WBC 3.1* 6.9 5.2 5.1  NEUTROABS 2.1 5.7  --   --   HGB 11.8* 12.2* 11.7* 12.4*  HCT 37.3* 38.7* 35.8* 39.1  MCV 89.9 89.6 90.2 90.7  PLT 103* 127* 156 117*    Basic Metabolic Panel: Recent Labs  Lab 07/17/18 0416 07/18/18 0341 07/19/18 1148 07/20/18 0400 07/21/18 0426 07/22/18 0523  NA 139 140 140 138 138 137  K 3.5 3.7 3.3* 4.0 3.9 3.9  CL 103 105 106 104 101 102  CO2 23 25 24 24 27 24   GLUCOSE 114* 93 113* 221* 90 146*  BUN 95* 48* 59* 65* 33* 57*  CREATININE 6.00* 3.74* 4.61* 4.95* 3.91* 5.88*  CALCIUM 6.4* 7.0* 6.8* 7.0* 7.0* 6.8*  MG 2.1 1.9  --   --   --   --   PHOS  --   --   --   --  3.4 4.3    GFR: Estimated Creatinine Clearance: 10.9 mL/min (A) (by C-G formula based on SCr of 5.88 mg/dL (H)).  Liver Function Tests: Recent Labs  Lab 07/21/18 0426 07/22/18 0523  ALBUMIN 2.1* 2.0*   No results for input(s): LIPASE, AMYLASE in the last 168 hours. No results for input(s): AMMONIA in the last 168 hours.  Coagulation Profile: Recent Labs  Lab 07/19/18 0500 07/20/18 0400 07/21/18 0426 07/22/18 0523 07/23/18 0347  INR 1.17 1.28 1.17 1.45 1.63    Cardiac Enzymes: No results for input(s): CKTOTAL, CKMB, CKMBINDEX, TROPONINI in the last 168 hours.  BNP (last 3 results) No results for input(s): PROBNP in the last 8760 hours.  HbA1C: No results for input(s): HGBA1C in the last 72 hours.  CBG: Recent Labs  Lab 07/22/18 1202 07/22/18 1710 07/22/18 2139 07/23/18 0834 07/23/18 1154  GLUCAP 248* 299* 228* 110*  147*    Lipid Profile: No results for input(s): CHOL, HDL, LDLCALC, TRIG, CHOLHDL, LDLDIRECT in the last 72  hours.  Thyroid Function Tests: No results for input(s): TSH, T4TOTAL, FREET4, T3FREE, THYROIDAB in the last 72 hours.  Anemia Panel: No results for input(s): VITAMINB12, FOLATE, FERRITIN, TIBC, IRON, RETICCTPCT in the last 72 hours.  Urine analysis:    Component Value Date/Time   COLORURINE AMBER (A) 07/08/2018 0839   APPEARANCEUR CLOUDY (A) 07/08/2018 0839   LABSPEC 1.016 07/08/2018 0839   PHURINE 5.0 07/08/2018 0839   GLUCOSEU NEGATIVE 07/08/2018 0839   HGBUR SMALL (A) 07/08/2018 0839   BILIRUBINUR NEGATIVE 07/08/2018 0839   KETONESUR 5 (A) 07/08/2018 0839   PROTEINUR 100 (A) 07/08/2018 0839   UROBILINOGEN 0.2 10/23/2013 1545   NITRITE NEGATIVE 07/08/2018 0839   LEUKOCYTESUR LARGE (A) 07/08/2018 0839    Sepsis Labs: Lactic Acid, Venous    Component Value Date/Time   LATICACIDVEN 1.5 07/08/2018 1952    MICROBIOLOGY: No results found for this or any previous visit (from the past 240 hour(s)).  RADIOLOGY STUDIES/RESULTS: Ct Abdomen Pelvis Wo Contrast  Result Date: 07/12/2018 CLINICAL DATA:  Bacteremia EXAM: CT ABDOMEN AND PELVIS WITHOUT CONTRAST TECHNIQUE: Multidetector CT imaging of the abdomen and pelvis was performed following the standard protocol without IV contrast. COMPARISON:  None. FINDINGS: Lower chest: Lung bases demonstrate mild atelectatic changes without sizable effusion. Coronary calcifications are seen. Defibrillator is again noted. Hepatobiliary: No focal liver abnormality is seen. No gallstones, gallbladder wall thickening, or biliary dilatation. Pancreas: Unremarkable. No pancreatic ductal dilatation or surrounding inflammatory changes. Spleen: Normal in size without focal abnormality. Adrenals/Urinary Tract: Adrenal glands are within normal limits. Kidneys are well visualized bilaterally. Right renal cyst is seen. A smaller left renal cyst is noted  as well no renal calculi or obstructive changes are seen. The ureters are within normal limits bilaterally. The bladder is partially distended. Stomach/Bowel: Diverticular changes noted within the colon without evidence of diverticulitis. The appendix is within normal limits. No small bowel inflammatory changes are seen. Stomach is decompressed. Vascular/Lymphatic: Aortic atherosclerosis. No enlarged abdominal or pelvic lymph nodes. Reproductive: Prostate is unremarkable. Other: No abdominal wall hernia or abnormality. No abdominopelvic ascites. Musculoskeletal: Degenerative changes of the lumbar spine are seen. IMPRESSION: Diverticulosis without diverticulitis. Bilateral renal cysts. Mild bibasilar atelectasis. Electronically Signed   By: Inez Catalina M.D.   On: 07/12/2018 19:09   Dg Chest 1 View  Result Date: 07/07/2018 CLINICAL DATA:  68 year old male with history of tachycardia. EXAM: CHEST  1 VIEW COMPARISON:  Chest x-ray 07/06/2018. FINDINGS: Status post median sternotomy. External defibrillator pad projecting over the lower left hemithorax. Left-sided pacemaker/AICD with lead tip projecting over the expected location of the right ventricular apex. Lung volumes are slightly low. No acute consolidative airspace disease. Small left pleural effusion. No right pleural effusion. There is cephalization of the pulmonary vasculature and slight indistinctness of the interstitial markings suggestive of mild pulmonary edema. Mild cardiomegaly. The patient is rotated to the left on today's exam, resulting in distortion of the mediastinal contours and reduced diagnostic sensitivity and specificity for mediastinal pathology. Aortic atherosclerosis. IMPRESSION: 1. Mild interstitial pulmonary edema and small left pleural effusion in the setting of mild cardiomegaly; imaging findings concerning for congestive heart failure. 2. Aortic atherosclerosis. 3. Postoperative changes and support apparatus, as above. Electronically  Signed   By: Vinnie Langton M.D.   On: 07/07/2018 11:54   Ct Head Wo Contrast  Result Date: 07/11/2018 CLINICAL DATA:  Initial evaluation for acute altered mental status, asymmetric left-sided weakness with left facial droop. EXAM: CT HEAD WITHOUT CONTRAST TECHNIQUE: Contiguous axial  images were obtained from the base of the skull through the vertex without intravenous contrast. COMPARISON:  Prior CT from 07/06/2018. FINDINGS: Brain: Age advanced cerebral atrophy with chronic microvascular ischemic disease, stable. Remote right MCA and/or MCA/PCA watershed territory infarct involving the right temporal occipital region noted, stable. Multiple chronic lacunar infarcts seen involving the bilateral basal ganglia, thalami, pons, and cerebellum. Specifically, previously noted right basal ganglia lacunar infarcts are stable from previous exam, likely chronic. No acute intracranial hemorrhage. No new or evolving acute large vessel territory infarct. No mass lesion, midline shift or mass effect. No hydrocephalus. No extra-axial fluid collection. Vascular: No hyperdense vessel. Calcified atherosclerosis at the skull base. Skull: Scalp soft tissues and calvarium within normal limits. Sinuses/Orbits: Globes and orbital soft tissues within normal limits. Scattered mucosal thickening with superimposed air-fluid levels noted within the paranasal sinuses, worsened from previous. Mastoid air cells are clear. Other: None. IMPRESSION: 1. Stable head CT, with no new acute intracranial abnormality. 2. In band cerebral atrophy with chronic small vessel ischemic disease with multiple chronic ischemic infarcts as above. 3. Acute pan sinusitis, worsened from previous. Electronically Signed   By: Jeannine Boga M.D.   On: 07/11/2018 17:20   Ct Head Wo Contrast  Result Date: 07/06/2018 CLINICAL DATA:  Altered mental status. History of HIV, hypertension, hypercholesterolemia and diabetes. EXAM: CT HEAD WITHOUT CONTRAST  TECHNIQUE: Contiguous axial images were obtained from the base of the skull through the vertex without intravenous contrast. COMPARISON:  CT HEAD July 21, 2016 FINDINGS: BRAIN: No intraparenchymal hemorrhage, mass effect, midline shift or acute large vascular territory infarcts. Old small RIGHT cerebellar and pontine infarcts. RIGHT temporal occipital encephalomalacia. New subcentimeter RIGHT internal capsule versus basal ganglia hypodensities. Old RIGHT basal ganglia infarct with mild ex vacuo dilatation RIGHT lateral ventricle. Old small bilateral thalami and LEFT basal ganglia infarcts. Confluent supratentorial white matter hypodensities. Moderate parenchymal brain volume loss. No hydrocephalus. VASCULAR: Moderate calcific atherosclerosis of the carotid siphons. SKULL: No skull fracture. No significant scalp soft tissue swelling. SINUSES/ORBITS: Mild paranasal sinus mucosal thickening. Chronic dehiscence of the RIGHT posterolateral maxillary wall. Mastoid air cells are well aerated.The included ocular globes and orbital contents are non-suspicious. OTHER: Bilateral parotid sialoliths. IMPRESSION: 1. New age indeterminate RIGHT basal ganglia small infarcts. 2. Old RIGHT temporal occipital/PCA territory infarct. 3. Old basal ganglia, thalami, pontine and cerebellar small infarcts. Moderate to severe chronic small vessel ischemic changes. 4. Moderate parenchymal brain volume loss, advanced for age. Electronically Signed   By: Elon Alas M.D.   On: 07/06/2018 22:16   US Renal  Result Date: 07/07/2018 CLINICAL DATA:  Acute kidney injury, history cardiomyopathy, chronic systolic heart failure, stage III chronic kidney disease, hypertension, COPD, former smoker EXAM: RENAL / URINARY TRACT ULTRASOUND COMPLETE COMPARISON:  09/05/2016 FINDINGS: Right Kidney: Renal measurements: 10.9.7 x 6.1 cm = volume: 200 mL. Cortical thinning. Increased cortical echogenicity. Tiny mid renal cyst 8 x 6.9 mm. Additional  peripelvic cyst 3.0 x 3.2 x 3.1 cm, simple features. No additional mass or hydronephrosis. No shadowing calculi. Left Kidney: Renal measurements: 12.3 x 6.6 x 6.6 cm = volume: 276 mL. Cortical thinning. Increased cortical echogenicity. Small cyst at inferior pole 1.0 x 0.9 x 1.2 cm. Additional small cyst at inferior pole 2.0 x 1.6 x 1.9 cm, simple features. No additional mass, hydronephrosis, or shadowing calcification. Bladder: Decompressed, unable to evaluate. IMPRESSION: Medical renal disease changes of both kidneys. Small renal cysts in both kidneys. No evidence of hydronephrosis. Electronically Signed   By:  Lavonia Dana M.D.   On: 07/07/2018 19:30   Ir Fluoro Guide Cv Line Right  Result Date: 07/09/2018 INDICATION: Acute uremia, acute kidney injury, no current access for dialysis EXAM: Ultrasound guidance for vascular access Right IJ temporary dialysis catheter (Mahurkar catheter) MEDICATIONS: 1% lidocaine local ANESTHESIA/SEDATION: Moderate Sedation Time: None. The patient's level of consciousness and vital signs were monitored continuously by radiology nursing throughout the procedure under my direct supervision. FLUOROSCOPY TIME:  Fluoroscopy Time: 0 minutes 24 seconds (2 mGy). COMPLICATIONS: None immediate. PROCEDURE: Emergency consent was obtained from the patient's physician after a thorough discussion of the procedural risks, benefits and alternatives. All questions were addressed. Maximal Sterile Barrier Technique was utilized including caps, mask, sterile gowns, sterile gloves, sterile drape, hand hygiene and skin antiseptic. A timeout was performed prior to the initiation of the procedure. Under sterile conditions and local anesthesia, ultrasound micropuncture access performed of the right internal jugular vein. Images obtained for documentation of the patent jugular vein. Guidewire advanced easily. Measurements obtained. Four [place] dilator advanced. Amplatz guidewire advanced into the IVC. Tract  dilatation performed to insert a temporary dialysis catheter. Tip position at the SVC RA junction. Images obtained for documentation. Catheter secured Prolene sutures. Blood aspirated easily followed by saline and heparin flushes. Appropriate volume and strength of heparin instilled in all lumens. External caps applied. Catheter secured with Prolene sutures and a sterile dressing. No immediate complication. Patient tolerated the procedure well. IMPRESSION: Successful ultrasound and fluoroscopic right IJ temporary dialysis catheter. Tip SVC RA junction. Ready for use. Electronically Signed   By: Jerilynn Mages.  Shick M.D.   On: 07/09/2018 13:06   Ir US Guide Vasc Access Right  Result Date: 07/09/2018 INDICATION: Acute uremia, acute kidney injury, no current access for dialysis EXAM: Ultrasound guidance for vascular access Right IJ temporary dialysis catheter (Mahurkar catheter) MEDICATIONS: 1% lidocaine local ANESTHESIA/SEDATION: Moderate Sedation Time: None. The patient's level of consciousness and vital signs were monitored continuously by radiology nursing throughout the procedure under my direct supervision. FLUOROSCOPY TIME:  Fluoroscopy Time: 0 minutes 24 seconds (2 mGy). COMPLICATIONS: None immediate. PROCEDURE: Emergency consent was obtained from the patient's physician after a thorough discussion of the procedural risks, benefits and alternatives. All questions were addressed. Maximal Sterile Barrier Technique was utilized including caps, mask, sterile gowns, sterile gloves, sterile drape, hand hygiene and skin antiseptic. A timeout was performed prior to the initiation of the procedure. Under sterile conditions and local anesthesia, ultrasound micropuncture access performed of the right internal jugular vein. Images obtained for documentation of the patent jugular vein. Guidewire advanced easily. Measurements obtained. Four [place] dilator advanced. Amplatz guidewire advanced into the IVC. Tract dilatation performed  to insert a temporary dialysis catheter. Tip position at the SVC RA junction. Images obtained for documentation. Catheter secured Prolene sutures. Blood aspirated easily followed by saline and heparin flushes. Appropriate volume and strength of heparin instilled in all lumens. External caps applied. Catheter secured with Prolene sutures and a sterile dressing. No immediate complication. Patient tolerated the procedure well. IMPRESSION: Successful ultrasound and fluoroscopic right IJ temporary dialysis catheter. Tip SVC RA junction. Ready for use. Electronically Signed   By: Jerilynn Mages.  Shick M.D.   On: 07/09/2018 13:06   Dg Chest Port 1 View  Result Date: 07/20/2018 CLINICAL DATA:  Placement of right IJ dialysis catheter. EXAM: PORTABLE CHEST 1 VIEW COMPARISON:  07/12/2010 FINDINGS: The prior IJ catheter was removed. There is a new double lumen dialysis catheter in place. The distal tip is in the  region of the right atrium and the proximal tip is likely near the cavoatrial junction. No complicating features. The right ventricular pacer wire is stable. Stable surgical changes from bypass surgery. The heart remains enlarged. There is tortuosity and calcification of the thoracic aorta. Mild vascular congestion without overt pulmonary edema. No pleural effusions or pneumothorax. IMPRESSION: Right IJ dialysis catheter in good position without complicating features. Cardiac enlargement and vascular congestion without overt pulmonary edema. Electronically Signed   By: Marijo Sanes M.D.   On: 07/20/2018 10:53   Dg Chest Port 1 View  Result Date: 07/12/2018 CLINICAL DATA:  Wheezing EXAM: PORTABLE CHEST 1 VIEW COMPARISON:  07/11/2018 FINDINGS: Right IJ approach hemodialysis catheter tip is in the right atrium. Unchanged position of left chest wall single lead AICD. Mild interstitial pulmonary edema. Mild cardiomegaly with findings of prior CABG. Trace left pleural effusion. IMPRESSION: Mild cardiomegaly and mild interstitial  pulmonary edema. Electronically Signed   By: Ulyses Jarred M.D.   On: 07/12/2018 04:32   Dg Chest Port 1 View  Result Date: 07/11/2018 CLINICAL DATA:  Shortness of breath EXAM: PORTABLE CHEST 1 VIEW COMPARISON:  07/07/2018 FINDINGS: Left chest wall AICD lead is in unchanged position. There is mild cardiomegaly. Central pulmonary vascular congestion without overt pulmonary edema. No focal airspace consolidation. Small left pleural effusion. No pneumothorax. IMPRESSION: Cardiomegaly and small left pleural effusion without overt pulmonary edema. Electronically Signed   By: Ulyses Jarred M.D.   On: 07/11/2018 03:35   Dg Chest Port 1 View  Result Date: 07/06/2018 CLINICAL DATA:  Sepsis EXAM: PORTABLE CHEST 1 VIEW COMPARISON:  Portable exam 2023 hours compared to 12/09/2016 FINDINGS: LEFT subclavian AICD with lead projecting over RIGHT ventricle. External pacing lead present. Enlargement of cardiac silhouette with postsurgical changes of CABG. Atherosclerotic calcification aorta. Mediastinal contours normal. Subsegmental atelectasis LEFT lower lobe. Pulmonary vascular congestion with interstitial prominence at the mid to lower lungs which could represent minimal pulmonary edema. No pleural effusion or pneumothorax. Bones demineralized. IMPRESSION: Enlargement of cardiac silhouette with vascular congestion and question minimal pulmonary edema. Electronically Signed   By: Lavonia Dana M.D.   On: 07/06/2018 20:35   Dg Swallowing Func-speech Pathology  Result Date: 07/14/2018 Objective Swallowing Evaluation: Type of Study: MBS-Modified Barium Swallow Study  Patient Details Name: Keith Young MRN: [id number] Date of Birth: Dec 26, 1950 Today's Date: 07/14/2018 Time: SLP Start Time (ACUTE ONLY): 48 -SLP Stop Time (ACUTE ONLY): 1150 SLP Time Calculation (min) (ACUTE ONLY): 20 min Past Medical History: Past Medical History: Diagnosis Date . AICD (automatic cardioverter/defibrillator) present  . Alcoholism in remission  ([place])  . Anemia  . Aortic insufficiency  . Arthritis  . At moderate risk for fall  . Bell's palsy  . Chronic systolic heart failure (HCC)   NYHA Class III . CKD (chronic kidney disease), stage IV ([place])  . COPD (chronic obstructive pulmonary disease) ([place])  . Essential hypertension, benign  . HIV disease ([place]) 09/06/2016 . Hyperlipidemia  . NICM (nonischemic cardiomyopathy) ([place])  . Noncompliance with medication regimen  . NSVT (nonsustained ventricular tachycardia) ([place])  . Numbness of right jaw   Had a stoke in 01/2013. Numbness is occasional, especially when trying to chew. . OSA (obstructive sleep apnea)  . Productive cough 08/2013  With brown sputum  . S/P aortic valve replacement with bioprosthetic valve  . Stroke ([place]) 01/2013  weakness of right side from CVA . Type 2 diabetes mellitus ([place])  . Uses hearing aid 2014  recently received new hearing  aids Past Surgical History: Past Surgical History: Procedure Laterality Date . BIOPSY N/A 04/10/2014  Procedure: GASTRIC BIOPSY;  Surgeon: Daneil Dolin, MD;  Location: [facility];  Service: Endoscopy;  Laterality: N/A; . BIOPSY  10/12/2015  Procedure: BIOPSY;  Surgeon: Daneil Dolin, MD;  Location: [facility];  Service: Endoscopy;;  stomach bx's . CARDIAC DEFIBRILLATOR PLACEMENT  11/12/12  [employer] Inogen MINI ICD implanted in [place] at [place] [facility] per Dr Nelly Laurence note . COLONOSCOPY WITH PROPOFOL N/A 04/10/2014  RMR: Colonic Diverticulosis . ESOPHAGOGASTRODUODENOSCOPY (EGD) WITH PROPOFOL N/A 04/10/2014  RMR: Mild erosive reflux esophagitis. Multiple antral polyps likely hyperplastic status post removal by hot snare cautery technique. Diffusely abnormal stomach status post gastric biopsy I suspect some of patients anemaia may be due to intermittent oozing from the stomach. It would be difficult and a risky proposition to attempt complete removal of all of his gastric polyps. . ESOPHAGOGASTRODUODENOSCOPY (EGD)  WITH PROPOFOL N/A 10/12/2015  Procedure: ESOPHAGOGASTRODUODENOSCOPY (EGD) WITH PROPOFOL;  Surgeon: Daneil Dolin, MD;  [facility];  Service: Endoscopy;  Laterality: N/A;  2423 - moved to 9:15 . IR FLUORO GUIDE CV LINE RIGHT  07/09/2018 . IR US GUIDE VASC ACCESS RIGHT  07/09/2018 . POLYPECTOMY N/A 04/10/2014  Procedure: GASTRIC POLYPECTOMY;  Surgeon: Daneil Dolin, MD;  Location: [facility];  Service: Endoscopy;  Laterality: N/A; . RIGHT HEART CATHETERIZATION N/A 02/06/2014  Procedure: RIGHT HEART CATH;  Surgeon: Larey Dresser, MD;  Location: [facility] CATH LAB;  Service: Cardiovascular;  Laterality: N/A; HPI: Keith Young is a 68 y.o. male with PMH significant of HIV, type 2 diabetes, COPD, CVA ([address]14, HTN, Bells Palsy, systolic CHF, CKD, and history of AICD placement followed by cardiology.  Pt was admitted to [facility] ED 07/07/18 and after presenting with AMS at SNF residence, vomitted, and became increasingly unresponsive. Per PA note, pt was found to be encephalopathic secondary to sepsis and renal failure. Pt seen by ST in Jan 2018; BSE recc D2/thin. CXR showed cardiomegaly and small left pleural effusion. Head CT revealed new age indeterminate RIGHT basal ganglia small infarcts, old RIGHT temporal occipital/PCA territory infarct, old basal ganglia, thalami, pontine and cerebellar small infarcts.  Subjective: alert, cooperative Assessment / Plan / Recommendation CHL IP CLINICAL IMPRESSIONS 07/14/2018 Clinical Impression Pt presents with primary moderate oral dysphagia, mild pharyngeal dysphagia. There is lingual pumping, decreased bolus cohesion, piecemeal deglutition and premature spillage (to pyriforms with liquids, valleculae with solids). Premature spillage results in intermittent deep larygneal penetration of thin, nectar, and honey thick liquids, with aspiration (sensed and silent) of thin and nectar. Due to impulsivity pt at risk for aspiration with liquids of any consistency. With solids, pt without rotary  chew, lingual thrusting noted with poor cohesion and spillage of solids to the valleculae. SLP used straw to facilitate chin tuck, which prevented aspiration with thin liquids, including small and large sips and consecutive boluses. There was trace shallow penetration with chin tuck which reversed with swallow. Brief stasis of barium tablet in mid-thoracic esophagus, cleared pt's GE junction with liquid wash. Recommend dys 2, thin liquids, meds whole in puree; pt must tuck chin when swallowing for airway protection. Recommend full supervision due to level of cuing necessary. SLP will follow for tolerance and training in swallow precautions/compensations.  SLP Visit Diagnosis Dysphagia, oral phase (R13.11);Dysphagia, pharyngeal phase (R13.13) Attention and concentration deficit following -- Frontal lobe and executive function deficit following -- Impact on safety  and function Moderate aspiration risk   CHL IP TREATMENT RECOMMENDATION 07/14/2018 Treatment Recommendations Therapy as outlined in treatment plan below   Prognosis 07/14/2018 Prognosis for Safe Diet Advancement Good Barriers to Reach Goals Cognitive deficits Barriers/Prognosis Comment -- CHL IP DIET RECOMMENDATION 07/14/2018 SLP Diet Recommendations Dysphagia 2 (Fine chop) solids;Thin liquid Liquid Administration via Straw Medication Administration Whole meds with puree Compensations Slow rate;Small sips/bites;Chin tuck;Clear throat intermittently;Use straw to facilitate chin tuck Postural Changes --   CHL IP OTHER RECOMMENDATIONS 07/14/2018 Recommended Consults -- Oral Care Recommendations Oral care BID Other Recommendations --   CHL IP FOLLOW UP RECOMMENDATIONS 07/14/2018 Follow up Recommendations Skilled Nursing facility   [facility] IP FREQUENCY AND DURATION 07/14/2018 Speech Therapy Frequency (ACUTE ONLY) min 2x/week Treatment Duration 2 weeks      CHL IP ORAL PHASE 07/14/2018 Oral Phase Impaired Oral - Pudding Teaspoon -- Oral - Pudding Cup -- Oral - Honey Teaspoon  NT Oral - Honey Cup Weak lingual manipulation;Lingual pumping;Reduced posterior propulsion;Piecemeal swallowing;Decreased bolus cohesion;Premature spillage Oral - Nectar Teaspoon Weak lingual manipulation;Lingual pumping;Reduced posterior propulsion;Piecemeal swallowing;Decreased bolus cohesion;Premature spillage Oral - Nectar Cup Weak lingual manipulation;Lingual pumping;Reduced posterior propulsion;Piecemeal swallowing;Decreased bolus cohesion;Premature spillage Oral - Nectar Straw Weak lingual manipulation;Lingual pumping;Reduced posterior propulsion;Piecemeal swallowing;Delayed oral transit;Decreased bolus cohesion Oral - Thin Teaspoon Lingual pumping;Reduced posterior propulsion;Piecemeal swallowing;Decreased bolus cohesion;Premature spillage Oral - Thin Cup Weak lingual manipulation;Lingual pumping;Reduced posterior propulsion;Piecemeal swallowing;Decreased bolus cohesion;Premature spillage Oral - Thin Straw Weak lingual manipulation;Reduced posterior propulsion;Piecemeal swallowing;Decreased bolus cohesion;Premature spillage Oral - Puree Weak lingual manipulation;Lingual pumping;Reduced posterior propulsion;Piecemeal swallowing;Decreased bolus cohesion;Premature spillage Oral - Mech Soft Impaired mastication;Weak lingual manipulation;Lingual pumping;Reduced posterior propulsion;Lingual/palatal residue;Piecemeal swallowing;Delayed oral transit;Premature spillage;Decreased bolus cohesion Oral - Regular -- Oral - Multi-Consistency -- Oral - Pill Other (Comment) Oral Phase - Comment --  CHL IP PHARYNGEAL PHASE 07/14/2018 Pharyngeal Phase Impaired Pharyngeal- Pudding Teaspoon -- Pharyngeal -- Pharyngeal- Pudding Cup -- Pharyngeal -- Pharyngeal- Honey Teaspoon -- Pharyngeal -- Pharyngeal- Honey Cup Penetration/Aspiration before swallow;Penetration/Aspiration during swallow Pharyngeal Material enters airway, remains ABOVE vocal cords then ejected out;Material enters airway, remains ABOVE vocal cords and not ejected  out Pharyngeal- Nectar Teaspoon -- Pharyngeal -- Pharyngeal- Nectar Cup Penetration/Aspiration before swallow;Penetration/Aspiration during swallow;Trace aspiration Pharyngeal Material does not enter airway;Material enters airway, CONTACTS cords and not ejected out;Material enters airway, passes BELOW cords without attempt by patient to eject out (silent aspiration) Pharyngeal- Nectar Straw Penetration/Aspiration before swallow;Penetration/Aspiration during swallow;Trace aspiration Pharyngeal Material enters airway, CONTACTS cords and not ejected out;Material enters airway, passes BELOW cords without attempt by patient to eject out (silent aspiration) Pharyngeal- Thin Teaspoon Penetration/Aspiration before swallow;Penetration/Aspiration during swallow;Moderate aspiration Pharyngeal Material enters airway, passes BELOW cords and not ejected out despite cough attempt by patient Pharyngeal- Thin Cup Penetration/Aspiration before swallow;Penetration/Aspiration during swallow;Moderate aspiration Pharyngeal Material does not enter airway;Material enters airway, CONTACTS cords and not ejected out;Material enters airway, passes BELOW cords and not ejected out despite cough attempt by patient;Material enters airway, passes BELOW cords without attempt by patient to eject out (silent aspiration) Pharyngeal- Thin Straw Penetration/Aspiration during swallow;Other (Comment) Pharyngeal Material does not enter airway;Material enters airway, remains ABOVE vocal cords then ejected out Pharyngeal- Puree WFL Pharyngeal -- Pharyngeal- Mechanical Soft WFL Pharyngeal -- Pharyngeal- Regular -- Pharyngeal -- Pharyngeal- Multi-consistency -- Pharyngeal -- Pharyngeal- Pill Other (Comment) Pharyngeal -- Pharyngeal Comment --  CHL IP CERVICAL ESOPHAGEAL PHASE 07/14/2018 Cervical Esophageal Phase WFL Pudding Teaspoon -- Pudding Cup -- Honey Teaspoon -- Honey Cup -- Nectar Teaspoon -- Nectar Cup -- Nectar Straw -- Thin Teaspoon --  Thin Cup -- Thin  Straw -- Puree -- Mechanical Soft -- Regular -- Multi-consistency -- Pill -- Cervical Esophageal Comment -- Deneise Lever, MS, [place] Pager: [phone] Office: [phone] Aliene Altes 07/14/2018, 1:46 PM              Dg Cyndy Freeze Guide Cv Line-no Report  Result Date: 07/20/2018 Fluoroscopy was utilized by the requesting physician.  No radiographic interpretation.   Vas [place] Upper Ext Vein Mapping (pre-op Avf)  Result Date: 07/18/2018 UPPER EXTREMITY VEIN MAPPING  Indications: Pre-access. Performing Technologist: Abram Sander RVS  Examination Guidelines: A complete evaluation includes B-mode imaging, spectral Doppler, color Doppler, and power Doppler as needed of all accessible portions of each vessel. Bilateral testing is considered an integral part of a complete examination. Limited examinations for reoccurring indications may be performed as noted. +-----------------+-------------+----------+--------------+ Right Cephalic   Diameter (cm)Depth (cm)   Findings    +-----------------+-------------+----------+--------------+ Shoulder             0.50        1.23                  +-----------------+-------------+----------+--------------+ Prox upper arm       0.55        0.84                  +-----------------+-------------+----------+--------------+ Mid upper arm        0.53        0.27                  +-----------------+-------------+----------+--------------+ Dist upper arm       0.41        0.41                  +-----------------+-------------+----------+--------------+ Antecubital fossa    0.50        0.29                  +-----------------+-------------+----------+--------------+ Prox forearm         0.24        0.22     branching    +-----------------+-------------+----------+--------------+ Mid forearm                             not visualized  +-----------------+-------------+----------+--------------+ Dist forearm                            not visualized +-----------------+-------------+----------+--------------+ +-----------------+-------------+----------+--------+ Right Basilic    Diameter (cm)Depth (cm)Findings +-----------------+-------------+----------+--------+ Prox upper arm       0.36        1.36            +-----------------+-------------+----------+--------+ Mid upper arm        0.44        1.66            +-----------------+-------------+----------+--------+ Dist upper arm       0.56        0.95            +-----------------+-------------+----------+--------+ Antecubital fossa    0.64        0.50            +-----------------+-------------+----------+--------+ Prox forearm         0.29        0.21            +-----------------+-------------+----------+--------+ Mid forearm  0.32        0.20            +-----------------+-------------+----------+--------+ Distal forearm       0.30        0.20            +-----------------+-------------+----------+--------+ +-----------------+-------------+----------+---------+ Left Cephalic    Diameter (cm)Depth (cm)Findings  +-----------------+-------------+----------+---------+ Shoulder             0.40        0.98             +-----------------+-------------+----------+---------+ Prox upper arm       0.43        0.62             +-----------------+-------------+----------+---------+ Mid upper arm        0.41        0.36             +-----------------+-------------+----------+---------+ Dist upper arm       0.39        0.37             +-----------------+-------------+----------+---------+ Antecubital fossa    0.50        0.34   branching +-----------------+-------------+----------+---------+ Prox forearm         0.31        0.18             +-----------------+-------------+----------+---------+ Mid forearm           0.29        0.19             +-----------------+-------------+----------+---------+ Dist forearm         0.29        0.17             +-----------------+-------------+----------+---------+ +-----------------+-------------+----------+--------------+ Left Basilic     Diameter (cm)Depth (cm)   Findings    +-----------------+-------------+----------+--------------+ Prox upper arm       0.70        1.48                  +-----------------+-------------+----------+--------------+ Mid upper arm        0.57        1.30     branching    +-----------------+-------------+----------+--------------+ Dist upper arm                          not visualized +-----------------+-------------+----------+--------------+ Antecubital fossa                       not visualized +-----------------+-------------+----------+--------------+ Prox forearm                            not visualized +-----------------+-------------+----------+--------------+ Mid forearm                             not visualized +-----------------+-------------+----------+--------------+ Distal forearm                          not visualized +-----------------+-------------+----------+--------------+ *See table(s) above for measurements and observations.  Diagnosing physician: Servando Snare MD Electronically signed by Servando Snare MD on 07/18/2018 at 5:32:40 PM.    Final    Dg Fluoro Guide Lumbar Puncture  Result Date: 07/10/2018 CLINICAL DATA:  68 year old male with encephalopathy. Subsequent encounter. EXAM: DIAGNOSTIC LUMBAR PUNCTURE UNDER FLUOROSCOPIC GUIDANCE FLUOROSCOPY TIME:  Fluoroscopy  Time:  6 seconds PROCEDURE: Patient was not able to give consent. Family (sister) not available by phone. Emergency request/consent from Dr. Candiss Norse. Labs and head CT reviewed. Under fluoroscopic guidance and aseptic technique (utilizing 1% lidocaine as local anesthetic), L3-4 lumbar puncture was performed with a single pass of  a 22 gauge spinal needle. Opening pressure 26 and closing pressure 20 cc water (not felt to be accurate as patient was lying on the stomach and moving throughout the exam). 8.5 cc of clear cerebral spinal fluid collected and sent for labs. Patient tolerated procedure well without any difficulty. IMPRESSION: Successful L3-4 lumbar puncture as noted above. Electronically Signed   By: Genia Del M.D.   On: 07/10/2018 15:12     LOS: 16 days   Oren Binet, MD  [facility]  If 7PM-7AM, please contact night-coverage  Please page via [email or web address]  Go to [email or web address] and use [place]'s universal password to access. If you do not have the password, please contact the hospital operator.  Locate the [facility] provider you are looking for under [facility] and page to a number that you can be directly reached. If you still have difficulty reaching the provider, please page the [facility] (Director on Call) for the Hospitalists listed on amion for assistance.  07/23/2018, 1:27 PM

## 2018-07-23 NOTE — Progress Notes (Signed)
Nutrition Follow-up  DOCUMENTATION CODES:   Non-severe (moderate) malnutrition in context of chronic illness  INTERVENTION:   - Continue renal MVI daily  - Continue Magic cup TID with meals, each supplement provides 290 kcal and 9 grams of protein  - Continue Ensure Enlive po BID, each supplement provides 350 kcal and 20 grams of protein (Ensure will need to be thickened to HONEY consistency prior to administration)  - Continue snacks TID between meals  - Liberalize diet to Regular per discussion with Nephrology MD  NUTRITION DIAGNOSIS:   Moderate Malnutrition related to chronic illness (CKD IV, CHF, HIV, COPD) as evidenced by mild fat depletion, mild muscle depletion.  Ongoing  GOAL:   Patient will meet greater than or equal to 90% of their needs  Progressing  MONITOR:   PO intake, Supplement acceptance, Labs  REASON FOR ASSESSMENT:   Consult Assessment of nutrition requirement/status  ASSESSMENT:   68 yo male admitted with sepsis due to E.coli bacteremia, acute metabolic/uremic encephalopathy, AKI on CKD stage IV requiring initiation of HD. PMH includes CKD IV, HTN, CHF EF 30%, AICD, HIV, DM, COPD, CVA with right sided weakness, diverticulosis  2/8 - sdmit 2/10 - temp HD cath placed, HD initiated 2/11 - LP-CSF not consistent with meningitis 2/13 - TEE without vegetation 2/21 - s/p R IJ tunneled cath placement, L arm AV fistula  Noted pt likely now ESRD.  Spoke with pt in HD. Pt is a poor historian and can't recall what he had to eat for breakfast or lunch today. Pt states that he is eating "pretty good." Per chart review, pt completed 75% of breakfast meal this morning.  Pt does not recall consuming Ensure Enlive oral nutrition supplements. Per MAR, pt accepting Ensure Enlive ~50% of the times offered.  Pt does remember eating Magic Cups with meals and states that he likes them.  [facility] MD regarding diet liberalization. Since potassium and  phosphorus are WNL, MD approved diet liberalization to Regular.  RD encouraged adequate PO intake. Discussed importance of adequate nutrition with pt.  Weight down 8 lbs since admission.  Meal Completion: 25-75% x last 8 meals  Medications reviewed and include: Ensure Enlive BID, SSI, Rena-vit, Prednisone, warfarin  Labs reviewed. Potassium and phosphorus WNL. CBG's: 147, 110, 228, 299 x 24 hours  UOP: 400 ml x 24 hours  Diet Order:   Diet Order            Diet regular Room service appropriate? Yes; Fluid consistency: Honey Thick; Fluid restriction: 1200 mL Fluid  Diet effective now              EDUCATION NEEDS:   Not appropriate for education at this time  Skin:  Skin Assessment: Skin Integrity Issues: Incisions: L arm, R neck, R chest  Last BM:  2/19  Height:   Ht Readings from Last 1 Encounters:  07/06/18 5\' 3"  (1.6 m)    Weight:   Wt Readings from Last 1 Encounters:  07/23/18 70.9 kg    Ideal Body Weight:  56.4 kg  BMI:  Body mass index is 27.69 kg/m.  Estimated Nutritional Needs:   Kcal:  2100-2300 kcals   Protein:  105-115 g  Fluid:  1000 mL plus UOP    Gaynell Face, MS, RD, LDN Inpatient Clinical Dietitian Pager: [phone] Weekend/After Hours: [phone]

## 2018-07-24 LAB — GLUCOSE, CAPILLARY
Glucose-Capillary: 156 mg/dL — ABNORMAL HIGH (ref 70–99)
Glucose-Capillary: 83 mg/dL (ref 70–99)
Glucose-Capillary: 181 mg/dL — ABNORMAL HIGH (ref 70–99)
Glucose-Capillary: 225 mg/dL — ABNORMAL HIGH (ref 70–99)

## 2018-07-24 LAB — PROTIME-INR
INR: 2 — ABNORMAL HIGH (ref 0.8–1.2)
Prothrombin Time: 22.2 seconds — ABNORMAL HIGH (ref 11.4–15.2)

## 2018-07-24 LAB — PARATHYROID HORMONE, INTACT (NO CA): PTH: 165 pg/mL — ABNORMAL HIGH (ref 15–65)

## 2018-07-24 MED ORDER — WARFARIN SODIUM 5 MG PO TABS
5.0000 mg | ORAL_TABLET | Freq: Once | ORAL | Status: AC
  Administered 2018-07-24: 5 mg via ORAL
  Filled 2018-07-24: qty 1

## 2018-07-24 NOTE — Progress Notes (Signed)
PROGRESS NOTE        PATIENT DETAILS Name: Keith Young Age: 68 y.o. Sex: male Date of Birth: [birth year] Admit Date: 07/06/2018 Admitting Physician Etta Quill, DO LAG:[id number], Rene Kocher, MD  Brief Narrative: Patient is a 68 y.o. male with history of chronic systolic heart failure with AICD in place, bioprosthetic AVR, stage IV CKD, DM-2, COPD, HIV on antiretrovirals-presented with sepsis secondary to E. coli bacteremia and acute kidney injury.  Unfortunately even with supportive care-he is now thought to have ESRD.  See below for further details  Subjective: Lying comfortably in bed-no chest pain or shortness of breath.  Awaiting clipping process and then subsequent transfer to SNF  Assessment/Plan: Sepsis secondary to E. coli bacteremia: Sepsis pathophysiology has resolved-evaluated by infectious disease, recommendations were for 2-week duration of ceftazidime-completed on February 23.  CT abdomen negative for any foci for bacteremia.  Acute metabolic encephalopathy: Multifactorial-secondary to sepsis and also from uremia due to worsening renal function.  Encephalopathy has markedly improved-although slow-he is able to answer all my questions. Underwent LP on 2/11-CSF not consistent with meningitis.  Acute kidney injury on stage IV CKD and now thought to have ESRD: AKI likely secondary to ATN in the setting of sepsis.  Has required intermittent HD-s/p right IJ tunneled HD catheter, and left upper extremity AV graft placement by V VS on 2/21.  No signs of recovery so far-nephrology feels like this is probably ESRD.  Awaiting clipping process-defer further HD to the nephrology service.    HIV: Continue antiretrovirals.  PAF: Rate controlled with metoprolol-INR therapeutic.  Continue Coumadin per pharmacy.   Thrombocytopenia: Secondary to sepsis-resolved.  Chronic systolic heart failure (EF 20-25% by TTE on 07/12/2018): Volume status is stable-diuresis with  HD.  Bioprosthetic AVR: TTE on 2/13 without any obvious vegetation-repeat blood cultures on 2/10.  DM-2 with hypoglycemia: CBGs stable-continue SSI.    Hypertension: Controlled-continue metoprolol.  Dysphagia: Speech therapy following-on dysphagia 2 diet.  Has prior history of CVA-along with profound deconditioning-may be contributing to oropharyngeal dysphagia.  Deconditioning/debility: Secondary to acute illness-PT evaluation completed-SNF on discharge  Non-severe (moderate) malnutrition in context of chronic illness   DVT Prophylaxis: SCD's  Code Status: Full code   Family Communication: None at bedside  Disposition Plan: Remain inpatient-SNF on discharge -but needs clipping process first.  Antimicrobial agents: Anti-infectives (From admission, onward)   Start     Dose/Rate Route Frequency Ordered Stop   07/20/18 0600  cefUROXime (ZINACEF) 1.5 g in sodium chloride 0.9 % 100 mL IVPB  Status:  Discontinued     1.5 g 200 mL/hr over 30 Minutes Intravenous On call to O.R. 07/19/18 1249 07/20/18 1155   07/12/18 2200  cefTAZidime (FORTAZ) 1 g in sodium chloride 0.9 % 100 mL IVPB     1 g 200 mL/hr over 30 Minutes Intravenous Every 24 hours 07/12/18 1449 07/22/18 2306   07/10/18 1430  vancomycin (VANCOCIN) IVPB 1000 mg/200 mL premix  Status:  Discontinued     1,000 mg 200 mL/hr over 60 Minutes Intravenous Every 48 hours 07/08/18 1421 07/09/18 0959   07/10/18 1000  cefTRIAXone (ROCEPHIN) 2 g in sodium chloride 0.9 % 100 mL IVPB  Status:  Discontinued     2 g 200 mL/hr over 30 Minutes Intravenous Every 24 hours 07/10/18 0942 07/12/18 1433   07/10/18 0847  cefTRIAXone (ROCEPHIN) 2 g  in sodium chloride 0.9 % 100 mL IVPB  Status:  Discontinued     2 g 200 mL/hr over 30 Minutes Intravenous Every 24 hours 07/09/18 1004 07/10/18 0933   07/09/18 1400  acyclovir (ZOVIRAX) 745 mg in dextrose 5 % 150 mL IVPB  Status:  Discontinued     10 mg/kg  74.5 kg 164.9 mL/hr over 60 Minutes  Intravenous Every 24 hours 07/08/18 1421 07/09/18 0959   07/09/18 1300  bictegravir-emtricitabine-tenofovir AF (BIKTARVY) 50-200-25 MG per tablet 1 tablet     1 tablet Oral Daily 07/09/18 1012     07/08/18 2300  cefTRIAXone (ROCEPHIN) 2 g in sodium chloride 0.9 % 100 mL IVPB  Status:  Discontinued     2 g 200 mL/hr over 30 Minutes Intravenous Every 12 hours 07/08/18 1341 07/09/18 1004   07/08/18 1800  vancomycin (VANCOCIN) IVPB 1000 mg/200 mL premix  Status:  Discontinued     1,000 mg 200 mL/hr over 60 Minutes Intravenous Every 48 hours 07/06/18 2019 07/07/18 1137   07/08/18 1430  ampicillin (OMNIPEN) 2 g in sodium chloride 0.9 % 100 mL IVPB  Status:  Discontinued     2 g 300 mL/hr over 20 Minutes Intravenous Every 12 hours 07/08/18 1343 07/09/18 0959   07/08/18 1430  vancomycin (VANCOCIN) 1,500 mg in sodium chloride 0.9 % 500 mL IVPB     1,500 mg 250 mL/hr over 120 Minutes Intravenous  Once 07/08/18 1421 07/08/18 1723   07/08/18 1400  acyclovir (ZOVIRAX) 745 mg in dextrose 5 % 150 mL IVPB     10 mg/kg  74.5 kg 164.9 mL/hr over 60 Minutes Intravenous  Once 07/08/18 1345 07/09/18 1315   07/07/18 1800  ceFEPIme (MAXIPIME) 1 g in sodium chloride 0.9 % 100 mL IVPB  Status:  Discontinued     1 g 200 mL/hr over 30 Minutes Intravenous Every 24 hours 07/06/18 2019 07/07/18 1137   07/07/18 1145  cefTRIAXone (ROCEPHIN) 2 g in sodium chloride 0.9 % 100 mL IVPB  Status:  Discontinued     2 g 200 mL/hr over 30 Minutes Intravenous Every 24 hours 07/07/18 1137 07/08/18 1341   07/07/18 1000  bictegravir-emtricitabine-tenofovir AF (BIKTARVY) 50-200-25 MG per tablet 1 tablet  Status:  Discontinued     1 tablet Oral Daily 07/07/18 0058 07/07/18 1127   07/06/18 1900  ceFEPIme (MAXIPIME) 2 g in sodium chloride 0.9 % 100 mL IVPB     2 g 200 mL/hr over 30 Minutes Intravenous  Once 07/06/18 1859 07/06/18 2001   07/06/18 1900  vancomycin (VANCOCIN) IVPB 1000 mg/200 mL premix     1,000 mg 200 mL/hr over 60  Minutes Intravenous  Once 07/06/18 1859 07/06/18 2035      Procedures: 2/21>> right IJ tunneled HD catheter, and AV fistula placement by vascular surgery 2/11>>LP 2/10>> IR guided HD catheter placement  CONSULTS:  cardiology, nephrology, neurology and vascular surgery  ID  Time spent: 15-minutes-Greater than 50% of this time was spent in counseling, explanation of diagnosis, planning of further management, and coordination of care.  MEDICATIONS: Scheduled Meds: . bictegravir-emtricitabine-tenofovir AF  1 tablet Oral Daily  . Chlorhexidine Gluconate Cloth  6 each Topical Q0600  . feeding supplement (ENSURE ENLIVE)  237 mL Oral BID BM  . insulin aspart  0-9 Units Subcutaneous TID WC  . mouth rinse  15 mL Mouth Rinse BID  . metoprolol succinate  25 mg Oral Daily  . mometasone-formoterol  2 puff Inhalation BID  . multivitamin  1  tablet Oral QHS  . predniSONE  40 mg Oral Q breakfast  . warfarin   Does not apply Once  . Warfarin - Pharmacist Dosing Inpatient   Does not apply q1800   Continuous Infusions: . sodium chloride 0 mL/hr at 07/15/18 0106   PRN Meds:.sodium chloride, acetaminophen **OR** acetaminophen, albuterol, HYDROcodone-acetaminophen, metoprolol tartrate, ondansetron **OR** ondansetron (ZOFRAN) IV, RESOURCE THICKENUP CLEAR, RESOURCE THICKENUP CLEAR, sodium chloride flush   PHYSICAL EXAM: Vital signs: Vitals:   07/23/18 2055 07/23/18 2310 07/24/18 0412 07/24/18 0937  BP:  130/87 124/79   Pulse:  88 81   Resp:  19 18 18   Temp:  97.7 F (36.5 C) 97.8 F (36.6 C)   TempSrc:  Oral Oral   SpO2: 97% 98% 98% 97%  Weight:      Height:       Filed Weights   07/23/18 0046 07/23/18 1200 07/23/18 1612  Weight: 71.4 kg 70.9 kg 68.9 kg   Body mass index is 26.91 kg/m.   General appearance:Awake, alert, not in any distress.  Eyes:no scleral icterus. HEENT: Atraumatic and Normocephalic Neck: supple, no JVD. Resp:Good air entry bilaterally,no rales or rhonchi CVS:  S1 S2 regular, no murmurs.  GI: Bowel sounds present, Non tender and not distended with no gaurding, rigidity or rebound. Extremities: B/L Lower Ext shows no edema, both legs are warm to touch Neurology:  Non focal  I have personally reviewed following labs and imaging studies  LABORATORY DATA: CBC: Recent Labs  Lab 07/18/18 0341 07/20/18 0400 07/21/18 0426 07/23/18 1708  WBC 6.9 5.2 5.1 10.4  NEUTROABS 5.7  --   --   --   HGB 12.2* 11.7* 12.4* 12.0*  HCT 38.7* 35.8* 39.1 37.4*  MCV 89.6 90.2 90.7 90.1  PLT 127* 156 117* 121*    Basic Metabolic Panel: Recent Labs  Lab 07/18/18 0341 07/19/18 1148 07/20/18 0400 07/21/18 0426 07/22/18 0523 07/23/18 1600  NA 140 140 138 138 137 137  K 3.7 3.3* 4.0 3.9 3.9 4.1  CL 105 106 104 101 102 103  CO2 25 24 24 27 24 22   GLUCOSE 93 113* 221* 90 146* 160*  BUN 48* 59* 65* 33* 57* 83*  CREATININE 3.74* 4.61* 4.95* 3.91* 5.88* 7.66*  CALCIUM 7.0* 6.8* 7.0* 7.0* 6.8* 6.6*  MG 1.9  --   --   --   --   --   PHOS  --   --   --  3.4 4.3 4.3    GFR: Estimated Creatinine Clearance: 8.3 mL/min (A) (by C-G formula based on SCr of 7.66 mg/dL (H)).  Liver Function Tests: Recent Labs  Lab 07/21/18 0426 07/22/18 0523 07/23/18 1600  ALBUMIN 2.1* 2.0* 2.0*   No results for input(s): LIPASE, AMYLASE in the last 168 hours. No results for input(s): AMMONIA in the last 168 hours.  Coagulation Profile: Recent Labs  Lab 07/20/18 0400 07/21/18 0426 07/22/18 0523 07/23/18 0347 07/24/18 1004  INR 1.28 1.17 1.45 1.63 2.0*    Cardiac Enzymes: No results for input(s): CKTOTAL, CKMB, CKMBINDEX, TROPONINI in the last 168 hours.  BNP (last 3 results) No results for input(s): PROBNP in the last 8760 hours.  HbA1C: No results for input(s): HGBA1C in the last 72 hours.  CBG: Recent Labs  Lab 07/23/18 1154 07/23/18 1743 07/23/18 2307 07/24/18 0415 07/24/18 0818  GLUCAP 147* 163* 282* 156* 83    Lipid Profile: No results for  input(s): CHOL, HDL, LDLCALC, TRIG, CHOLHDL, LDLDIRECT in the last 72 hours.  Thyroid Function Tests: No results for input(s): TSH, T4TOTAL, FREET4, T3FREE, THYROIDAB in the last 72 hours.  Anemia Panel: No results for input(s): VITAMINB12, FOLATE, FERRITIN, TIBC, IRON, RETICCTPCT in the last 72 hours.  Urine analysis:    Component Value Date/Time   COLORURINE AMBER (A) 07/08/2018 0839   APPEARANCEUR CLOUDY (A) 07/08/2018 0839   LABSPEC 1.016 07/08/2018 0839   PHURINE 5.0 07/08/2018 0839   GLUCOSEU NEGATIVE 07/08/2018 0839   HGBUR SMALL (A) 07/08/2018 0839   BILIRUBINUR NEGATIVE 07/08/2018 0839   KETONESUR 5 (A) 07/08/2018 0839   PROTEINUR 100 (A) 07/08/2018 0839   UROBILINOGEN 0.2 10/23/2013 1545   NITRITE NEGATIVE 07/08/2018 0839   LEUKOCYTESUR LARGE (A) 07/08/2018 0839    Sepsis Labs: Lactic Acid, Venous    Component Value Date/Time   LATICACIDVEN 1.5 07/08/2018 1952    MICROBIOLOGY: No results found for this or any previous visit (from the past 240 hour(s)).  RADIOLOGY STUDIES/RESULTS: Ct Abdomen Pelvis Wo Contrast  Result Date: 07/12/2018 CLINICAL DATA:  Bacteremia EXAM: CT ABDOMEN AND PELVIS WITHOUT CONTRAST TECHNIQUE: Multidetector CT imaging of the abdomen and pelvis was performed following the standard protocol without IV contrast. COMPARISON:  None. FINDINGS: Lower chest: Lung bases demonstrate mild atelectatic changes without sizable effusion. Coronary calcifications are seen. Defibrillator is again noted. Hepatobiliary: No focal liver abnormality is seen. No gallstones, gallbladder wall thickening, or biliary dilatation. Pancreas: Unremarkable. No pancreatic ductal dilatation or surrounding inflammatory changes. Spleen: Normal in size without focal abnormality. Adrenals/Urinary Tract: Adrenal glands are within normal limits. Kidneys are well visualized bilaterally. Right renal cyst is seen. A smaller left renal cyst is noted as well no renal calculi or obstructive  changes are seen. The ureters are within normal limits bilaterally. The bladder is partially distended. Stomach/Bowel: Diverticular changes noted within the colon without evidence of diverticulitis. The appendix is within normal limits. No small bowel inflammatory changes are seen. Stomach is decompressed. Vascular/Lymphatic: Aortic atherosclerosis. No enlarged abdominal or pelvic lymph nodes. Reproductive: Prostate is unremarkable. Other: No abdominal wall hernia or abnormality. No abdominopelvic ascites. Musculoskeletal: Degenerative changes of the lumbar spine are seen. IMPRESSION: Diverticulosis without diverticulitis. Bilateral renal cysts. Mild bibasilar atelectasis. Electronically Signed   By: Inez Catalina M.D.   On: 07/12/2018 19:09   Dg Chest 1 View  Result Date: 07/07/2018 CLINICAL DATA:  68 year old male with history of tachycardia. EXAM: CHEST  1 VIEW COMPARISON:  Chest x-ray 07/06/2018. FINDINGS: Status post median sternotomy. External defibrillator pad projecting over the lower left hemithorax. Left-sided pacemaker/AICD with lead tip projecting over the expected location of the right ventricular apex. Lung volumes are slightly low. No acute consolidative airspace disease. Small left pleural effusion. No right pleural effusion. There is cephalization of the pulmonary vasculature and slight indistinctness of the interstitial markings suggestive of mild pulmonary edema. Mild cardiomegaly. The patient is rotated to the left on today's exam, resulting in distortion of the mediastinal contours and reduced diagnostic sensitivity and specificity for mediastinal pathology. Aortic atherosclerosis. IMPRESSION: 1. Mild interstitial pulmonary edema and small left pleural effusion in the setting of mild cardiomegaly; imaging findings concerning for congestive heart failure. 2. Aortic atherosclerosis. 3. Postoperative changes and support apparatus, as above. Electronically Signed   By: Vinnie Langton M.D.   On:  07/07/2018 11:54   Ct Head Wo Contrast  Result Date: 07/11/2018 CLINICAL DATA:  Initial evaluation for acute altered mental status, asymmetric left-sided weakness with left facial droop. EXAM: CT HEAD WITHOUT CONTRAST TECHNIQUE: Contiguous axial images were  obtained from the base of the skull through the vertex without intravenous contrast. COMPARISON:  Prior CT from 07/06/2018. FINDINGS: Brain: Age advanced cerebral atrophy with chronic microvascular ischemic disease, stable. Remote right MCA and/or MCA/PCA watershed territory infarct involving the right temporal occipital region noted, stable. Multiple chronic lacunar infarcts seen involving the bilateral basal ganglia, thalami, pons, and cerebellum. Specifically, previously noted right basal ganglia lacunar infarcts are stable from previous exam, likely chronic. No acute intracranial hemorrhage. No new or evolving acute large vessel territory infarct. No mass lesion, midline shift or mass effect. No hydrocephalus. No extra-axial fluid collection. Vascular: No hyperdense vessel. Calcified atherosclerosis at the skull base. Skull: Scalp soft tissues and calvarium within normal limits. Sinuses/Orbits: Globes and orbital soft tissues within normal limits. Scattered mucosal thickening with superimposed air-fluid levels noted within the paranasal sinuses, worsened from previous. Mastoid air cells are clear. Other: None. IMPRESSION: 1. Stable head CT, with no new acute intracranial abnormality. 2. In band cerebral atrophy with chronic small vessel ischemic disease with multiple chronic ischemic infarcts as above. 3. Acute pan sinusitis, worsened from previous. Electronically Signed   By: Jeannine Boga M.D.   On: 07/11/2018 17:20   Ct Head Wo Contrast  Result Date: 07/06/2018 CLINICAL DATA:  Altered mental status. History of HIV, hypertension, hypercholesterolemia and diabetes. EXAM: CT HEAD WITHOUT CONTRAST TECHNIQUE: Contiguous axial images were obtained  from the base of the skull through the vertex without intravenous contrast. COMPARISON:  CT HEAD July 21, 2016 FINDINGS: BRAIN: No intraparenchymal hemorrhage, mass effect, midline shift or acute large vascular territory infarcts. Old small RIGHT cerebellar and pontine infarcts. RIGHT temporal occipital encephalomalacia. New subcentimeter RIGHT internal capsule versus basal ganglia hypodensities. Old RIGHT basal ganglia infarct with mild ex vacuo dilatation RIGHT lateral ventricle. Old small bilateral thalami and LEFT basal ganglia infarcts. Confluent supratentorial white matter hypodensities. Moderate parenchymal brain volume loss. No hydrocephalus. VASCULAR: Moderate calcific atherosclerosis of the carotid siphons. SKULL: No skull fracture. No significant scalp soft tissue swelling. SINUSES/ORBITS: Mild paranasal sinus mucosal thickening. Chronic dehiscence of the RIGHT posterolateral maxillary wall. Mastoid air cells are well aerated.The included ocular globes and orbital contents are non-suspicious. OTHER: Bilateral parotid sialoliths. IMPRESSION: 1. New age indeterminate RIGHT basal ganglia small infarcts. 2. Old RIGHT temporal occipital/PCA territory infarct. 3. Old basal ganglia, thalami, pontine and cerebellar small infarcts. Moderate to severe chronic small vessel ischemic changes. 4. Moderate parenchymal brain volume loss, advanced for age. Electronically Signed   By: Elon Alas M.D.   On: 07/06/2018 22:16   US Renal  Result Date: 07/07/2018 CLINICAL DATA:  Acute kidney injury, history cardiomyopathy, chronic systolic heart failure, stage III chronic kidney disease, hypertension, COPD, former smoker EXAM: RENAL / URINARY TRACT ULTRASOUND COMPLETE COMPARISON:  09/05/2016 FINDINGS: Right Kidney: Renal measurements: 10.9.7 x 6.1 cm = volume: 200 mL. Cortical thinning. Increased cortical echogenicity. Tiny mid renal cyst 8 x 6.9 mm. Additional peripelvic cyst 3.0 x 3.2 x 3.1 cm, simple features.  No additional mass or hydronephrosis. No shadowing calculi. Left Kidney: Renal measurements: 12.3 x 6.6 x 6.6 cm = volume: 276 mL. Cortical thinning. Increased cortical echogenicity. Small cyst at inferior pole 1.0 x 0.9 x 1.2 cm. Additional small cyst at inferior pole 2.0 x 1.6 x 1.9 cm, simple features. No additional mass, hydronephrosis, or shadowing calcification. Bladder: Decompressed, unable to evaluate. IMPRESSION: Medical renal disease changes of both kidneys. Small renal cysts in both kidneys. No evidence of hydronephrosis. Electronically Signed   By: Elta Gu[place]  Thornton Papas M.D.   On: 07/07/2018 19:30   Ir Fluoro Guide Cv Line Right  Result Date: 07/09/2018 INDICATION: Acute uremia, acute kidney injury, no current access for dialysis EXAM: Ultrasound guidance for vascular access Right IJ temporary dialysis catheter (Mahurkar catheter) MEDICATIONS: 1% lidocaine local ANESTHESIA/SEDATION: Moderate Sedation Time: None. The patient's level of consciousness and vital signs were monitored continuously by radiology nursing throughout the procedure under my direct supervision. FLUOROSCOPY TIME:  Fluoroscopy Time: 0 minutes 24 seconds (2 mGy). COMPLICATIONS: None immediate. PROCEDURE: Emergency consent was obtained from the patient's physician after a thorough discussion of the procedural risks, benefits and alternatives. All questions were addressed. Maximal Sterile Barrier Technique was utilized including caps, mask, sterile gowns, sterile gloves, sterile drape, hand hygiene and skin antiseptic. A timeout was performed prior to the initiation of the procedure. Under sterile conditions and local anesthesia, ultrasound micropuncture access performed of the right internal jugular vein. Images obtained for documentation of the patent jugular vein. Guidewire advanced easily. Measurements obtained. Four [place] dilator advanced. Amplatz guidewire advanced into the IVC. Tract dilatation performed to insert a temporary dialysis  catheter. Tip position at the SVC RA junction. Images obtained for documentation. Catheter secured Prolene sutures. Blood aspirated easily followed by saline and heparin flushes. Appropriate volume and strength of heparin instilled in all lumens. External caps applied. Catheter secured with Prolene sutures and a sterile dressing. No immediate complication. Patient tolerated the procedure well. IMPRESSION: Successful ultrasound and fluoroscopic right IJ temporary dialysis catheter. Tip SVC RA junction. Ready for use. Electronically Signed   By: Jerilynn Mages.  Shick M.D.   On: 07/09/2018 13:06   Ir US Guide Vasc Access Right  Result Date: 07/09/2018 INDICATION: Acute uremia, acute kidney injury, no current access for dialysis EXAM: Ultrasound guidance for vascular access Right IJ temporary dialysis catheter (Mahurkar catheter) MEDICATIONS: 1% lidocaine local ANESTHESIA/SEDATION: Moderate Sedation Time: None. The patient's level of consciousness and vital signs were monitored continuously by radiology nursing throughout the procedure under my direct supervision. FLUOROSCOPY TIME:  Fluoroscopy Time: 0 minutes 24 seconds (2 mGy). COMPLICATIONS: None immediate. PROCEDURE: Emergency consent was obtained from the patient's physician after a thorough discussion of the procedural risks, benefits and alternatives. All questions were addressed. Maximal Sterile Barrier Technique was utilized including caps, mask, sterile gowns, sterile gloves, sterile drape, hand hygiene and skin antiseptic. A timeout was performed prior to the initiation of the procedure. Under sterile conditions and local anesthesia, ultrasound micropuncture access performed of the right internal jugular vein. Images obtained for documentation of the patent jugular vein. Guidewire advanced easily. Measurements obtained. Four [place] dilator advanced. Amplatz guidewire advanced into the IVC. Tract dilatation performed to insert a temporary dialysis catheter. Tip  position at the SVC RA junction. Images obtained for documentation. Catheter secured Prolene sutures. Blood aspirated easily followed by saline and heparin flushes. Appropriate volume and strength of heparin instilled in all lumens. External caps applied. Catheter secured with Prolene sutures and a sterile dressing. No immediate complication. Patient tolerated the procedure well. IMPRESSION: Successful ultrasound and fluoroscopic right IJ temporary dialysis catheter. Tip SVC RA junction. Ready for use. Electronically Signed   By: Jerilynn Mages.  Shick M.D.   On: 07/09/2018 13:06   Dg Chest Port 1 View  Result Date: 07/20/2018 CLINICAL DATA:  Placement of right IJ dialysis catheter. EXAM: PORTABLE CHEST 1 VIEW COMPARISON:  07/12/2010 FINDINGS: The prior IJ catheter was removed. There is a new double lumen dialysis catheter in place. The distal tip is in the region of  the right atrium and the proximal tip is likely near the cavoatrial junction. No complicating features. The right ventricular pacer wire is stable. Stable surgical changes from bypass surgery. The heart remains enlarged. There is tortuosity and calcification of the thoracic aorta. Mild vascular congestion without overt pulmonary edema. No pleural effusions or pneumothorax. IMPRESSION: Right IJ dialysis catheter in good position without complicating features. Cardiac enlargement and vascular congestion without overt pulmonary edema. Electronically Signed   By: Marijo Sanes M.D.   On: 07/20/2018 10:53   Dg Chest Port 1 View  Result Date: 07/12/2018 CLINICAL DATA:  Wheezing EXAM: PORTABLE CHEST 1 VIEW COMPARISON:  07/11/2018 FINDINGS: Right IJ approach hemodialysis catheter tip is in the right atrium. Unchanged position of left chest wall single lead AICD. Mild interstitial pulmonary edema. Mild cardiomegaly with findings of prior CABG. Trace left pleural effusion. IMPRESSION: Mild cardiomegaly and mild interstitial pulmonary edema. Electronically Signed   By:  Ulyses Jarred M.D.   On: 07/12/2018 04:32   Dg Chest Port 1 View  Result Date: 07/11/2018 CLINICAL DATA:  Shortness of breath EXAM: PORTABLE CHEST 1 VIEW COMPARISON:  07/07/2018 FINDINGS: Left chest wall AICD lead is in unchanged position. There is mild cardiomegaly. Central pulmonary vascular congestion without overt pulmonary edema. No focal airspace consolidation. Small left pleural effusion. No pneumothorax. IMPRESSION: Cardiomegaly and small left pleural effusion without overt pulmonary edema. Electronically Signed   By: Ulyses Jarred M.D.   On: 07/11/2018 03:35   Dg Chest Port 1 View  Result Date: 07/06/2018 CLINICAL DATA:  Sepsis EXAM: PORTABLE CHEST 1 VIEW COMPARISON:  Portable exam 2023 hours compared to 12/09/2016 FINDINGS: LEFT subclavian AICD with lead projecting over RIGHT ventricle. External pacing lead present. Enlargement of cardiac silhouette with postsurgical changes of CABG. Atherosclerotic calcification aorta. Mediastinal contours normal. Subsegmental atelectasis LEFT lower lobe. Pulmonary vascular congestion with interstitial prominence at the mid to lower lungs which could represent minimal pulmonary edema. No pleural effusion or pneumothorax. Bones demineralized. IMPRESSION: Enlargement of cardiac silhouette with vascular congestion and question minimal pulmonary edema. Electronically Signed   By: Lavonia Dana M.D.   On: 07/06/2018 20:35   Dg Swallowing Func-speech Pathology  Result Date: 07/14/2018 Objective Swallowing Evaluation: Type of Study: MBS-Modified Barium Swallow Study  Patient Details Name: KRITHIK MAPEL MRN: [id number] Date of Birth: June 22, 1950 Today's Date: 07/14/2018 Time: SLP Start Time (ACUTE ONLY): 110 -SLP Stop Time (ACUTE ONLY): 1150 SLP Time Calculation (min) (ACUTE ONLY): 20 min Past Medical History: Past Medical History: Diagnosis Date . AICD (automatic cardioverter/defibrillator) present  . Alcoholism in remission ([place])  . Anemia  . Aortic insufficiency  .  Arthritis  . At moderate risk for fall  . Bell's palsy  . Chronic systolic heart failure (HCC)   NYHA Class III . CKD (chronic kidney disease), stage IV ([place])  . COPD (chronic obstructive pulmonary disease) ([place])  . Essential hypertension, benign  . HIV disease ([place]) 09/06/2016 . Hyperlipidemia  . NICM (nonischemic cardiomyopathy) ([place])  . Noncompliance with medication regimen  . NSVT (nonsustained ventricular tachycardia) ([place])  . Numbness of right jaw   Had a stoke in 01/2013. Numbness is occasional, especially when trying to chew. . OSA (obstructive sleep apnea)  . Productive cough 08/2013  With brown sputum  . S/P aortic valve replacement with bioprosthetic valve  . Stroke ([place]) 01/2013  weakness of right side from CVA . Type 2 diabetes mellitus ([place])  . Uses hearing aid 2014  recently received new hearing aids Past  Surgical History: Past Surgical History: Procedure Laterality Date . BIOPSY N/A 04/10/2014  Procedure: GASTRIC BIOPSY;  Surgeon: Daneil Dolin, MD;  Location: [facility];  Service: Endoscopy;  Laterality: N/A; . BIOPSY  10/12/2015  Procedure: BIOPSY;  Surgeon: Daneil Dolin, MD;  Location: [facility] END[facility] SU[facility];  Service: Endoscopy;;  stomach bx's . CARDIAC DEFIBRILLATOR PLACEMENT  11/12/12  [employer] Inogen MINI ICD implanted in [place] at [place] AVR per Dr Nelly Laurence note . COLONOSCOPY WITH PROPOFOL N/A 04/10/2014  RMR: Colonic Diverticulosis . ESOPHAGOGASTRODUODENOSCOPY (EGD) WITH PROPOFOL N/A 04/10/2014  RMR: Mild erosive reflux esophagitis. Multiple antral polyps likely hyperplastic status post removal by hot snare cautery technique. Diffusely abnormal stomach status post gastric biopsy I suspect some of patients anemaia may be due to intermittent oozing from the stomach. It would be difficult and a risky proposition to attempt complete removal of all of his gastric polyps. . ESOPHAGOGASTRODUODENOSCOPY (EGD) WITH PROPOFOL N/A 10/12/2015  Procedure:  ESOPHAGOGASTRODUODENOSCOPY (EGD) WITH PROPOFOL;  Surgeon: Daneil Dolin, MD;  Location: [facility];  Service: Endoscopy;  Laterality: N/A;  0814 - moved to 9:15 . IR FLUORO GUIDE CV LINE RIGHT  07/09/2018 . IR US GUIDE VASC ACCESS RIGHT  07/09/2018 . POLYPECTOMY N/A 04/10/2014  Procedure: GASTRIC POLYPECTOMY;  Surgeon: Daneil Dolin, MD;  Location: [facility];  Service: Endoscopy;  Laterality: N/A; . RIGHT HEART CATHETERIZATION N/A 02/06/2014  Procedure: RIGHT HEART CATH;  Surgeon: Larey Dresser, MD;  Location: [facility] CATH LAB;  Service: Cardiovascular;  Laterality: N/A; HPI: ARDEN TINOCO is a 68 y.o. male with PMH significant of HIV, type 2 diabetes, COPD, CVA ([id number], HTN, Bells Palsy, systolic CHF, CKD, and history of AICD placement followed by cardiology.  Pt was [facility] to [facility] ED 07/07/18 and after presenting with AMS at SNF residence, vomitted, and became increasingly unresponsive. Per PA note, pt was found to be encephalopathic secondary to sepsis and renal failure. Pt seen by ST in Jan 2018; BSE recc D2/thin. CXR showed cardiomegaly and small left pleural effusion. Head CT revealed new age indeterminate RIGHT basal ganglia small infarcts, old RIGHT temporal occipital/PCA territory infarct, old basal ganglia, thalami, pontine and cerebellar small infarcts.  Subjective: alert, cooperative Assessment / Plan / Recommendation CHL IP CLINICAL IMPRESSIONS 07/14/2018 Clinical Impression Pt presents with primary moderate oral dysphagia, mild pharyngeal dysphagia. There is lingual pumping, decreased bolus cohesion, piecemeal deglutition and premature spillage (to pyriforms with liquids, valleculae with solids). Premature spillage results in intermittent deep larygneal penetration of thin, nectar, and honey thick liquids, with aspiration (sensed and silent) of thin and nectar. Due to impulsivity pt at risk for aspiration with liquids of any consistency. With solids, pt without rotary chew, lingual thrusting noted with poor  cohesion and spillage of solids to the valleculae. SLP used straw to facilitate chin tuck, which prevented aspiration with thin liquids, including small and large sips and consecutive boluses. There was trace shallow penetration with chin tuck which reversed with swallow. Brief stasis of barium tablet in mid-thoracic esophagus, cleared pt's GE junction with liquid wash. Recommend dys 2, thin liquids, meds whole in puree; pt must tuck chin when swallowing for airway protection. Recommend full supervision due to level of cuing necessary. SLP will follow for tolerance and training in swallow precautions/compensations.  SLP Visit Diagnosis Dysphagia, oral phase (R13.11);Dysphagia, pharyngeal phase (R13.13) Attention and concentration deficit following -- Frontal lobe and executive function deficit following -- Impact on safety and function  Moderate aspiration risk   CHL IP TREATMENT RECOMMENDATION 07/14/2018 Treatment Recommendations Therapy as outlined in treatment plan below   Prognosis 07/14/2018 Prognosis for Safe Diet Advancement Good Barriers to Reach Goals Cognitive deficits Barriers/Prognosis Comment -- CHL IP DIET RECOMMENDATION 07/14/2018 SLP Diet Recommendations Dysphagia 2 (Fine chop) solids;Thin liquid Liquid Administration via Straw Medication Administration Whole meds with puree Compensations Slow rate;Small sips/bites;Chin tuck;Clear throat intermittently;Use straw to facilitate chin tuck Postural Changes --   CHL IP OTHER RECOMMENDATIONS 07/14/2018 Recommended Consults -- Oral Care Recommendations Oral care BID Other Recommendations --   CHL IP FOLLOW UP RECOMMENDATIONS 07/14/2018 Follow up Recommendations Skilled Nursing facility   [facility] IP FREQUENCY AND DURATION 07/14/2018 Speech Therapy Frequency (ACUTE ONLY) min 2x/week Treatment Duration 2 weeks      CHL IP ORAL PHASE 07/14/2018 Oral Phase Impaired Oral - Pudding Teaspoon -- Oral - Pudding Cup -- Oral - Honey Teaspoon NT Oral - Honey Cup Weak lingual  manipulation;Lingual pumping;Reduced posterior propulsion;Piecemeal swallowing;Decreased bolus cohesion;Premature spillage Oral - Nectar Teaspoon Weak lingual manipulation;Lingual pumping;Reduced posterior propulsion;Piecemeal swallowing;Decreased bolus cohesion;Premature spillage Oral - Nectar Cup Weak lingual manipulation;Lingual pumping;Reduced posterior propulsion;Piecemeal swallowing;Decreased bolus cohesion;Premature spillage Oral - Nectar Straw Weak lingual manipulation;Lingual pumping;Reduced posterior propulsion;Piecemeal swallowing;Delayed oral transit;Decreased bolus cohesion Oral - Thin Teaspoon Lingual pumping;Reduced posterior propulsion;Piecemeal swallowing;Decreased bolus cohesion;Premature spillage Oral - Thin Cup Weak lingual manipulation;Lingual pumping;Reduced posterior propulsion;Piecemeal swallowing;Decreased bolus cohesion;Premature spillage Oral - Thin Straw Weak lingual manipulation;Reduced posterior propulsion;Piecemeal swallowing;Decreased bolus cohesion;Premature spillage Oral - Puree Weak lingual manipulation;Lingual pumping;Reduced posterior propulsion;Piecemeal swallowing;Decreased bolus cohesion;Premature spillage Oral - Mech Soft Impaired mastication;Weak lingual manipulation;Lingual pumping;Reduced posterior propulsion;Lingual/palatal residue;Piecemeal swallowing;Delayed oral transit;Premature spillage;Decreased bolus cohesion Oral - Regular -- Oral - Multi-Consistency -- Oral - Pill Other (Comment) Oral Phase - Comment --  CHL IP PHARYNGEAL PHASE 07/14/2018 Pharyngeal Phase Impaired Pharyngeal- Pudding Teaspoon -- Pharyngeal -- Pharyngeal- Pudding Cup -- Pharyngeal -- Pharyngeal- Honey Teaspoon -- Pharyngeal -- Pharyngeal- Honey Cup Penetration/Aspiration before swallow;Penetration/Aspiration during swallow Pharyngeal Material enters airway, remains ABOVE vocal cords then ejected out;Material enters airway, remains ABOVE vocal cords and not ejected out Pharyngeal- Nectar Teaspoon --  Pharyngeal -- Pharyngeal- Nectar Cup Penetration/Aspiration before swallow;Penetration/Aspiration during swallow;Trace aspiration Pharyngeal Material does not enter airway;Material enters airway, CONTACTS cords and not ejected out;Material enters airway, passes BELOW cords without attempt by patient to eject out (silent aspiration) Pharyngeal- Nectar Straw Penetration/Aspiration before swallow;Penetration/Aspiration during swallow;Trace aspiration Pharyngeal Material enters airway, CONTACTS cords and not ejected out;Material enters airway, passes BELOW cords without attempt by patient to eject out (silent aspiration) Pharyngeal- Thin Teaspoon Penetration/Aspiration before swallow;Penetration/Aspiration during swallow;Moderate aspiration Pharyngeal Material enters airway, passes BELOW cords and not ejected out despite cough attempt by patient Pharyngeal- Thin Cup Penetration/Aspiration before swallow;Penetration/Aspiration during swallow;Moderate aspiration Pharyngeal Material does not enter airway;Material enters airway, CONTACTS cords and not ejected out;Material enters airway, passes BELOW cords and not ejected out despite cough attempt by patient;Material enters airway, passes BELOW cords without attempt by patient to eject out (silent aspiration) Pharyngeal- Thin Straw Penetration/Aspiration during swallow;Other (Comment) Pharyngeal Material does not enter airway;Material enters airway, remains ABOVE vocal cords then ejected out Pharyngeal- Puree WFL Pharyngeal -- Pharyngeal- Mechanical Soft WFL Pharyngeal -- Pharyngeal- Regular -- Pharyngeal -- Pharyngeal- Multi-consistency -- Pharyngeal -- Pharyngeal- Pill Other (Comment) Pharyngeal -- Pharyngeal Comment --  CHL IP CERVICAL ESOPHAGEAL PHASE 07/14/2018 Cervical Esophageal Phase WFL Pudding Teaspoon -- Pudding Cup -- Honey Teaspoon -- Honey Cup -- Nectar Teaspoon -- Nectar Cup -- Nectar Straw -- Thin Teaspoon -- Thin  Cup -- Thin Straw -- Puree -- Mechanical Soft  -- Regular -- Multi-consistency -- Pill -- Cervical Esophageal Comment -- Deneise Lever, MS, [place] Pager: [phone] Office: [phone] Aliene Altes 07/14/2018, 1:46 PM              Dg Cyndy Freeze Guide Cv Line-no Report  Result Date: 07/20/2018 Fluoroscopy was utilized by the requesting physician.  No radiographic interpretation.   Vas [place] Upper Ext Vein Mapping (pre-op Avf)  Result Date: 07/18/2018 UPPER EXTREMITY VEIN MAPPING  Indications: Pre-access. Performing Technologist: Abram Sander RVS  Examination Guidelines: A complete evaluation includes B-mode imaging, spectral Doppler, color Doppler, and power Doppler as needed of all accessible portions of each vessel. Bilateral testing is considered an integral part of a complete examination. Limited examinations for reoccurring indications may be performed as noted. +-----------------+-------------+----------+--------------+ Right Cephalic   Diameter (cm)Depth (cm)   Findings    +-----------------+-------------+----------+--------------+ Shoulder             0.50        1.23                  +-----------------+-------------+----------+--------------+ Prox upper arm       0.55        0.84                  +-----------------+-------------+----------+--------------+ Mid upper arm        0.53        0.27                  +-----------------+-------------+----------+--------------+ Dist upper arm       0.41        0.41                  +-----------------+-------------+----------+--------------+ Antecubital fossa    0.50        0.29                  +-----------------+-------------+----------+--------------+ Prox forearm         0.24        0.22     branching    +-----------------+-------------+----------+--------------+ Mid forearm                             not visualized +-----------------+-------------+----------+--------------+ Dist forearm                             not visualized +-----------------+-------------+----------+--------------+ +-----------------+-------------+----------+--------+ Right Basilic    Diameter (cm)Depth (cm)Findings +-----------------+-------------+----------+--------+ Prox upper arm       0.36        1.36            +-----------------+-------------+----------+--------+ Mid upper arm        0.44        1.66            +-----------------+-------------+----------+--------+ Dist upper arm       0.56        0.95            +-----------------+-------------+----------+--------+ Antecubital fossa    0.64        0.50            +-----------------+-------------+----------+--------+ Prox forearm         0.29        0.21            +-----------------+-------------+----------+--------+ Mid forearm  0.32        0.20            +-----------------+-------------+----------+--------+ Distal forearm       0.30        0.20            +-----------------+-------------+----------+--------+ +-----------------+-------------+----------+---------+ Left Cephalic    Diameter (cm)Depth (cm)Findings  +-----------------+-------------+----------+---------+ Shoulder             0.40        0.98             +-----------------+-------------+----------+---------+ Prox upper arm       0.43        0.62             +-----------------+-------------+----------+---------+ Mid upper arm        0.41        0.36             +-----------------+-------------+----------+---------+ Dist upper arm       0.39        0.37             +-----------------+-------------+----------+---------+ Antecubital fossa    0.50        0.34   branching +-----------------+-------------+----------+---------+ Prox forearm         0.31        0.18             +-----------------+-------------+----------+---------+ Mid forearm          0.29        0.19             +-----------------+-------------+----------+---------+ Dist  forearm         0.29        0.17             +-----------------+-------------+----------+---------+ +-----------------+-------------+----------+--------------+ Left Basilic     Diameter (cm)Depth (cm)   Findings    +-----------------+-------------+----------+--------------+ Prox upper arm       0.70        1.48                  +-----------------+-------------+----------+--------------+ Mid upper arm        0.57        1.30     branching    +-----------------+-------------+----------+--------------+ Dist upper arm                          not visualized +-----------------+-------------+----------+--------------+ Antecubital fossa                       not visualized +-----------------+-------------+----------+--------------+ Prox forearm                            not visualized +-----------------+-------------+----------+--------------+ Mid forearm                             not visualized +-----------------+-------------+----------+--------------+ Distal forearm                          not visualized +-----------------+-------------+----------+--------------+ *See table(s) above for measurements and observations.  Diagnosing physician: Servando Snare MD Electronically signed by Servando Snare MD on 07/18/2018 at 5:32:40 PM.    Final    Dg Fluoro Guide Lumbar Puncture  Result Date: 07/10/2018 CLINICAL DATA:  68 year old male with encephalopathy. Subsequent encounter. EXAM: DIAGNOSTIC LUMBAR PUNCTURE UNDER FLUOROSCOPIC GUIDANCE FLUOROSCOPY TIME:  Fluoroscopy  Time:  6 seconds PROCEDURE: Patient was not able to give consent. Family (sister) not available by phone. Emergency request/consent from Dr. Candiss Norse. Labs and head CT reviewed. Under fluoroscopic guidance and aseptic technique (utilizing 1% lidocaine as local anesthetic), L3-4 lumbar puncture was performed with a single pass of a 22 gauge spinal needle. Opening pressure 26 and closing pressure 20 cc water (not felt to  be accurate as patient was lying on the stomach and moving throughout the exam). 8.5 cc of clear cerebral spinal fluid collected and sent for labs. Patient tolerated procedure well without any difficulty. IMPRESSION: Successful L3-4 lumbar puncture as noted above. Electronically Signed   By: Genia Del M.D.   On: 07/10/2018 15:12     LOS: 17 days   Oren Binet, MD  [facility]  If 7PM-7AM, please contact night-coverage  Please page via [email or web address]  Go to [email or web address] and use [facility]'s universal password to access. If you do not have the password, please contact the hospital operator.  Locate the [facility] provider you are looking for under [facility] and page to a number that you can be directly reached. If you still have difficulty reaching the provider, please page the [facility] (Director on Call) for the Hospitalists listed on amion for assistance.  07/24/2018, 11:43 AM

## 2018-07-24 NOTE — Progress Notes (Addendum)
Per Freda Munro, HD assistant, paperwork is in process for outpatient  CLIP. Freda Munro states [facility] in [place] will be the location, however,  awaiting chair time. Whitman Hero RN,BSN,CM

## 2018-07-24 NOTE — Clinical Social Work Note (Signed)
CSW completed [facility] Placement Request form and faxed this with requested information today.   Roseana Rhine Givens, MSW, LCSW Licensed Clinical Social Worker [place] [phone]

## 2018-07-24 NOTE — Progress Notes (Signed)
ANTICOAGULATION CONSULT NOTE - Follow Up Consult  Pharmacy Consult for Coumadin Indication: atrial fibrillation  No Known Allergies  Patient Measurements: Height: 5\' 3"  (160 cm) Weight: 151 lb 14.4 oz (68.9 kg) IBW/kg (Calculated) : 56.9  Vital Signs: Temp: 98.1 F (36.7 C) (02/25 1322) Temp Source: Oral (02/25 1322) BP: 135/86 (02/25 1322) Pulse Rate: 91 (02/25 1322)  Labs: Recent Labs    07/22/18 0523 07/23/18 0347 07/23/18 1600 07/23/18 1708 07/24/18 1004  HGB  --   --   --  12.0*  --   HCT  --   --   --  37.4*  --   PLT  --   --   --  121*  --   LABPROT 17.5* 19.2*  --   --  22.2*  INR 1.45 1.63  --   --  2.0*  CREATININE 5.88*  --  7.66*  --   --     Estimated Creatinine Clearance: 8.3 mL/min (A) (by C-G formula based on SCr of 7.66 mg/dL (H)).  Assessment: No anticoagulation PTA. s/p IV heparin for new onset AFib with RVR, CHA2DS2-Vasc score 5 >>stopped due to thrombocytopenia. Coumadin started 2/18.  INR therapeutic today at 2  Goal of Therapy:  INR 2-3 Monitor platelets by anticoagulation protocol: Yes   Plan:  - Coumadin 5mg  po x 1 - Daily INR - f/u patient education when/if encephalopathy improved  Onnie Boer, PharmD, BCIDP, AAHIVP, CPP Infectious Disease Pharmacist 07/24/2018 2:13 PM

## 2018-07-24 NOTE — Progress Notes (Signed)
Physical Therapy Treatment Patient Details Name: Keith Young MRN: [id number] DOB: 07/27/1950 Today's Date: 07/24/2018    History of Present Illness Patient is a 68 year old male admitted with acute mental status change and lethargy, septic shock  According to chart patient lives in facility. PMH to include: CHF, CKD, DM, HTN, HIV, COPD, OA S/P IJ catheter on 2/21.    PT Comments    Pt to EOB with mod A, stood from EOB with mod A +2. Slow processing with all mobility tasks and needed min A for safety as well as navigating with RW. Ambulated 8' with RW and min A +2 for safety. Had difficulty with turning. PT will continue to follow.    Follow Up Recommendations  SNF     Equipment Recommendations  None recommended by PT    Recommendations for Other Services       Precautions / Restrictions Precautions Precautions: Fall Restrictions Weight Bearing Restrictions: No    Mobility  Bed Mobility Overal bed mobility: Needs Assistance Bed Mobility: Supine to Sit     Supine to sit: Mod assist     General bed mobility comments: pt needed specific step by step instructions to initiate mobility to EOB. Was able to slide LE's off bed but could not push away from bed to rise to sitting. When given mod HHA, was able to elevate trunk. Needed mod A to scoot hips to EOB as well.  Transfers Overall transfer level: Needs assistance Equipment used: Rolling walker (2 wheeled) Transfers: Sit to/from [facility] Sit to Stand: Mod assist;+2 physical assistance;From elevated surface Stand pivot transfers: Mod assist;+2 physical assistance       General transfer comment: assistance needed to advance RW for turning  Ambulation/Gait Ambulation/Gait assistance: Min assist;+2 safety/equipment Gait Distance (Feet): 8 Feet Assistive device: Rolling walker (2 wheeled) Gait Pattern/deviations: Shuffle;Step-to pattern;Decreased step length - left;Decreased step length -  right;Narrow base of support;Trunk flexed Gait velocity: decreased Gait velocity interpretation: <1.31 ft/sec, indicative of household ambulator General Gait Details: stood from recliner and walked to wall then turned around and walked back to recliner, had difficulty with turning and needed vc's as well as assist moving RW. Pt very fatigued last 3'.    Stairs             Wheelchair Mobility    Modified Rankin (Stroke Patients Only)       Balance Overall balance assessment: Needs assistance Sitting-balance support: Bilateral upper extremity supported Sitting balance-Leahy Scale: Fair Sitting balance - Comments: able to maintain balance sitting EOB   Standing balance support: Bilateral upper extremity supported Standing balance-Leahy Scale: Poor Standing balance comment: needs UE support as well as min A to maintain standing                            Cognition Arousal/Alertness: Awake/alert Behavior During Therapy: WFL for tasks assessed/performed Overall Cognitive Status: No family/caregiver present to determine baseline cognitive functioning                                 General Comments: Pt follows single step commands, slow processing, minimal verbalization. Does verbalize when he is fatigued and needs a rest      Exercises Total Joint Exercises Bridges: AROM;10 reps;Supine General Exercises - Upper Extremity Shoulder Flexion: AROM;Both;10 reps;Supine General Exercises - Lower Extremity Ankle Circles/Pumps: AROM;Both;10 reps;Supine Heel Slides: AROM;Both;10  reps;Supine    General Comments General comments (skin integrity, edema, etc.): swelling, tightness, and warmth, noted LUE, RN aware      Pertinent Vitals/Pain Pain Assessment: Faces Faces Pain Scale: Hurts little more Pain Location: LUE Pain Descriptors / Indicators: Tightness;Sore    Home Living                      Prior Function            PT Goals  (current goals can now be found in the care plan section) Acute Rehab PT Goals Patient Stated Goal: none stated PT Goal Formulation: Patient unable to participate in goal setting Time For Goal Achievement: 07/26/18 Potential to Achieve Goals: Fair Progress towards PT goals: Progressing toward goals    Frequency    Min 2X/week      PT Plan Current plan remains appropriate    Co-evaluation              AM-PAC PT "6 Clicks" Mobility   Outcome Measure  Help needed turning from your back to your side while in a flat bed without using bedrails?: A Lot Help needed moving from lying on your back to sitting on the side of a flat bed without using bedrails?: A Lot Help needed moving to and from a bed to a chair (including a wheelchair)?: A Lot Help needed standing up from a chair using your arms (e.g., wheelchair or bedside chair)?: A Lot Help needed to walk in hospital room?: A Lot Help needed climbing 3-5 steps with a railing? : Total 6 Click Score: 11    End of Session Equipment Utilized During Treatment: Gait belt Activity Tolerance: Patient tolerated treatment well Patient left: with call bell/phone within reach;in chair;with chair alarm set Nurse Communication: Mobility status PT Visit Diagnosis: Muscle weakness (generalized) (M62.81);Difficulty in walking, not elsewhere classified (R26.2);Pain Pain - Right/Left: Left Pain - part of body: Arm     Time: 1037-1100 PT Time Calculation (min) (ACUTE ONLY): 23 min  Charges:  $Gait Training: 8-22 mins $Neuromuscular Re-education: 8-22 mins                     [name]  Pager [phone] Office [name] 07/24/2018, 1:32 PM

## 2018-07-24 NOTE — Progress Notes (Signed)
  Speech Language Pathology Treatment: Dysphagia  Patient Details Name: Keith Young MRN: [id number] DOB: 1950-10-17 Today's Date: 07/24/2018 Time: 7414-2395 SLP Time Calculation (min) (ACUTE ONLY): 17 min  Assessment / Plan / Recommendation Clinical Impression  Pt needs Mod cues for use of safe swallowing strategies, including smaller bites/sips, upright positioning, and slow rate of intake, as his awareness of his dysphagia is limited. Immediate and delayed coughing follows trials of thin and nectar thick liquids regardless of chin tuck, for which pt needs Mod-Max cues to implement. Note that diet was advanced to regular textures but oral preparation and oral phase seems to be consistent with what was noted on MBS. Therefore, will adjust diet back to Dys 2 textures, continuing honey thick liquids. SLP will continue to follow.   HPI HPI: Keith Young is a 68 y.o. male with PMH significant of HIV, type 2 diabetes, COPD, CVA (32023, HTN, Bells Palsy, systolic CHF, CKD, and history of AICD placement followed by cardiology. Pt was admitted to [facility] ED 07/07/18 and after presenting with AMS at SNF residence, vomitted, and became increasingly unresponsive. Per PA note, pt was found to be encephalopathic secondary to sepsis and renal failure. Pt seen by ST in Jan 2018; BSE recc D2/thin. CXR showed cardiomegaly and small left pleural effusion. Head CT revealed new age indeterminate RIGHT basal ganglia small infarcts, old RIGHT temporal occipital/PCA territory infarct, old basal ganglia, thalami, pontine and cerebellar small infarcts.  No new imaging since MBSS      SLP Plan  Continue with current plan of care       Recommendations  Diet recommendations: Dysphagia 2 (fine chop);Honey-thick liquid Liquids provided via: Cup Medication Administration: Whole meds with puree Supervision: Full supervision/cueing for compensatory strategies Compensations: Slow rate;Small sips/bites;Minimize environmental  distractions;Clear throat intermittently Postural Changes and/or Swallow Maneuvers: Seated upright 90 degrees                Oral Care Recommendations: Oral care BID Follow up Recommendations: Skilled Nursing facility SLP Visit Diagnosis: Dysphagia, oropharyngeal phase (R13.12) Plan: Continue with current plan of care       GO                Keith Young 07/24/2018, 9:37 AM  Keith Young, M.A. [place] Acute Environmental education officer [phone] Office [phone]

## 2018-07-24 NOTE — Progress Notes (Signed)
Patient ID: Keith Young, male   DOB: 09-Apr-1951, 68 y.o.   MRN: [id number]  [facility] Progress Note    Subjective:   No new complaints   Objective:   BP 135/86 (BP Location: Left Arm)   Pulse 91   Temp 98.1 F (36.7 C) (Oral)   Resp 20   Ht 5\' 3"  (1.6 m)   Wt 68.9 kg   SpO2 98%   BMI 26.91 kg/m   Intake/Output: I/O last 3 completed shifts: In: 240 [P.O.:240] Out: 2414 [Urine:400; Other:2014]   Intake/Output this shift:  No intake/output data recorded. Weight change: -0.5 kg  Physical Exam: Gen: sitting in chair CVS: no rub Resp: cta Abd: benign Ext: no edema, left forearm avg +T/B with some edema.  Labs: BMET Recent Labs  Lab 07/18/18 0341 07/19/18 1148 07/20/18 0400 07/21/18 0426 07/22/18 0523 07/23/18 1600  NA 140 140 138 138 137 137  K 3.7 3.3* 4.0 3.9 3.9 4.1  CL 105 106 104 101 102 103  CO2 25 24 24 27 24 22   GLUCOSE 93 113* 221* 90 146* 160*  BUN 48* 59* 65* 33* 57* 83*  CREATININE 3.74* 4.61* 4.95* 3.91* 5.88* 7.66*  ALBUMIN  --   --   --  2.1* 2.0* 2.0*  CALCIUM 7.0* 6.8* 7.0* 7.0* 6.8* 6.6*  PHOS  --   --   --  3.4 4.3 4.3   CBC Recent Labs  Lab 07/18/18 0341 07/20/18 0400 07/21/18 0426 07/23/18 1708  WBC 6.9 5.2 5.1 10.4  NEUTROABS 5.7  --   --   --   HGB 12.2* 11.7* 12.4* 12.0*  HCT 38.7* 35.8* 39.1 37.4*  MCV 89.6 90.2 90.7 90.1  PLT 127* 156 117* 121*    @IMGRELPRIORS @ Medications:    . bictegravir-emtricitabine-tenofovir AF  1 tablet Oral Daily  . Chlorhexidine Gluconate Cloth  6 each Topical Q0600  . feeding supplement (ENSURE ENLIVE)  237 mL Oral BID BM  . insulin aspart  0-9 Units Subcutaneous TID WC  . mouth rinse  15 mL Mouth Rinse BID  . metoprolol succinate  25 mg Oral Daily  . mometasone-formoterol  2 puff Inhalation BID  . multivitamin  1 tablet Oral QHS  . predniSONE  40 mg Oral Q breakfast  . warfarin   Does not apply Once  . Warfarin - Pharmacist Dosing Inpatient   Does not apply [id number]      Assessment/ Plan:   1. Sepsis due to E Coli Uti- per ID completed 2 week course. TEE negative, CT showed diverticuli, and blood cultures negative 07/09/18.  2. ESRD- initially with AKI/CKD stage 4 but has remained dialysis dependent since 07/09/18.  No real signs of recovery and had advanced CKD prior to admission.   1. Continue with HD q MWF.   2. Starting CLIP process 3. Anemia: Hgb >12, no ESA 4. CKD-MBD: ca/phos stable.  iPTH has been low in the past will recheck. 5. Nutrition:renal diet 6. L forearm avg- placed 07/20/18 remains edematous but improving, +T/B 7. Hypertension:stable 8. Metabolic encephalopathy- presumably due to combination of sepsis and uremia.  Slow mentation but alert and awake.  9. 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 Combined CHF cardiology following 11. A fib. 12. HIV on biktarvy 13. Severe Protein malnutrition- encourage protein intake 14. Disposition- cont with PT/OT for deconditioning and assessment of needs for discharge.  CLIP process underway   Donetta Potts, MD [place] Pager [phone] 07/24/2018, 1:31 PM

## 2018-07-25 LAB — GLUCOSE, CAPILLARY
GLUCOSE-CAPILLARY: 224 mg/dL — AB (ref 70–99)
Glucose-Capillary: 184 mg/dL — ABNORMAL HIGH (ref 70–99)
Glucose-Capillary: 351 mg/dL — ABNORMAL HIGH (ref 70–99)

## 2018-07-25 LAB — PROTIME-INR
INR: 2.1 — ABNORMAL HIGH (ref 0.8–1.2)
Prothrombin Time: 23 seconds — ABNORMAL HIGH (ref 11.4–15.2)

## 2018-07-25 MED ORDER — WARFARIN SODIUM 5 MG PO TABS
5.0000 mg | ORAL_TABLET | Freq: Every day | ORAL | Status: DC
  Administered 2018-07-25: 5 mg via ORAL
  Filled 2018-07-25: qty 1

## 2018-07-25 MED ORDER — INSULIN ASPART 100 UNIT/ML ~~LOC~~ SOLN
5.0000 [IU] | Freq: Once | SUBCUTANEOUS | Status: AC
  Administered 2018-07-25: 5 [IU] via SUBCUTANEOUS

## 2018-07-25 MED ORDER — HEPARIN SODIUM (PORCINE) 1000 UNIT/ML DIALYSIS
20.0000 [IU]/kg | INTRAMUSCULAR | Status: DC | PRN
  Administered 2018-07-25: 3400 [IU] via INTRAVENOUS_CENTRAL
  Filled 2018-07-25: qty 1.4

## 2018-07-25 NOTE — Progress Notes (Signed)
ANTICOAGULATION CONSULT NOTE - Follow Up Consult  Pharmacy Consult for Coumadin Indication: atrial fibrillation  No Known Allergies  Patient Measurements: Height: 5\' 3"  (160 cm) Weight: 160 lb 15 oz (73 kg) IBW/kg (Calculated) : 56.9  Vital Signs: Temp: 98.6 F (37 C) (02/26 1155) Temp Source: Oral (02/26 1155) BP: 114/71 (02/26 1230) Pulse Rate: 78 (02/26 1230)  Labs: Recent Labs    07/23/18 0347 07/23/18 1600 07/23/18 1708 07/24/18 1004 07/25/18 0440  HGB  --   --  12.0*  --   --   HCT  --   --  37.4*  --   --   PLT  --   --  121*  --   --   LABPROT 19.2*  --   --  22.2* 23.0*  INR 1.63  --   --  2.0* 2.1*  CREATININE  --  7.66*  --   --   --     Estimated Creatinine Clearance: 8.5 mL/min (A) (by C-G formula based on SCr of 7.66 mg/dL (H)).  Assessment: No anticoagulation PTA. s/p IV heparin for new onset AFib with RVR, CHA2DS2-Vasc score 5 >>stopped due to thrombocytopenia. Coumadin started 2/18.  INR seems to be stable now. We will start a maintenance dose.  Goal of Therapy:  INR 2-3 Monitor platelets by anticoagulation protocol: Yes   Plan:  - Coumadin 5mg  PO qday - Daily INR - f/u patient education when/if encephalopathy improved  Onnie Boer, PharmD, BCIDP, AAHIVP, CPP Infectious Disease Pharmacist 07/25/2018 1:25 PM

## 2018-07-25 NOTE — Progress Notes (Signed)
SLP Cancellation Note  Patient Details Name: Keith Young MRN: [id number] DOB: 1950/09/28   Cancelled treatment:       Reason Eval/Treat Not Completed: Patient declined speech therapy this morning and all POs on breakfast tray. Will continue to follow as able.   [name] Edwing Figley 07/25/2018, 9:06 AM  [name], M.A. [place] Acute Environmental education officer [phone] Office [phone]

## 2018-07-25 NOTE — Clinical Social Work Note (Signed)
Patient has been accepted by [facility] in [place] and his chair time is TTS at 11:15 am. Patient will discharge back to [facility] when medically stable and be transported to dialysis by the facility. This was confirmed by telephone contact with Tammy, admissions social worker at [facility]. CSW advised [facility] [phone]) of patient's discharge plan and how he will get to HD weekly.  Rogenia Werntz Givens, MSW, LCSW Licensed Clinical Social Worker [place] [phone]

## 2018-07-25 NOTE — Progress Notes (Signed)
PROGRESS NOTE        PATIENT DETAILS Name: TRINI CHRISTIANSEN Age: 68 y.o. Sex: male Date of Birth: [birth year] Admit Date: 07/06/2018 Admitting Physician Etta Quill, DO OIN:OMVEH, Rene Kocher, MD  Brief Narrative: Patient is a 68 y.o. male with history of chronic systolic heart failure with AICD in place, bioprosthetic AVR, stage IV CKD, DM-2, COPD, HIV on antiretrovirals-presented with sepsis secondary to E. coli bacteremia and acute kidney injury.  Unfortunately even with supportive care-he is now thought to have ESRD.  See below for further details  Subjective: Lying comfortably in bed-appears weak-sometimes slow to respond but this mostly awake and alert.  Awaiting SNF/clipping process.  Assessment/Plan: Sepsis secondary to E. coli bacteremia: Sepsis pathophysiology has resolved-evaluated by infectious disease, recommendations were for 2-week duration of ceftazidime-completed on February 23.  CT abdomen negative for any foci for bacteremia.  Acute metabolic encephalopathy: Multifactorial-secondary to sepsis and also from uremia due to worsening renal function.  Encephalopathy has markedly improved-although slow-he is able to answer all my questions. Underwent LP on 2/11-CSF not consistent with meningitis.  Acute kidney injury on stage IV CKD and now thought to have ESRD: AKI likely secondary to ATN in the setting of sepsis.  Has required intermittent HD-s/p right IJ tunneled HD catheter, and left upper extremity AV graft placement by V VS on 2/21.  No signs of recovery so far-nephrology feels like this is probably ESRD.  Awaiting clipping process-defer further HD to the nephrology service.    HIV: Continue antiretrovirals.  PAF: Rate controlled with metoprolol-INR therapeutic.  Continue Coumadin per pharmacy.   Thrombocytopenia: Secondary to sepsis-resolved.  Chronic systolic heart failure (EF 20-25% by TTE on 07/12/2018): Volume status is stable-diuresis with  HD.  Bioprosthetic AVR: TTE on 2/13 without any obvious vegetation-repeat blood cultures on 2/10.  DM-2 with hypoglycemia: CBGs relatively stable-continue SSI.  Hypertension: Controlled-continue metoprolol.  Dysphagia: Speech therapy following-on dysphagia 2 diet.  Has prior history of CVA-along with profound deconditioning-may be contributing to oropharyngeal dysphagia.  Deconditioning/debility: Secondary to acute illness-PT evaluation completed-SNF on discharge  Non-severe (moderate) malnutrition in context of chronic illness   DVT Prophylaxis: SCD's  Code Status: Full code   Family Communication: None at bedside  Disposition Plan: Remain inpatient-SNF on discharge -but needs clipping process first.  Antimicrobial agents: Anti-infectives (From admission, onward)   Start     Dose/Rate Route Frequency Ordered Stop   07/20/18 0600  cefUROXime (ZINACEF) 1.5 g in sodium chloride 0.9 % 100 mL IVPB  Status:  Discontinued     1.5 g 200 mL/hr over 30 Minutes Intravenous On call to O.R. 07/19/18 1249 07/20/18 1155   07/12/18 2200  cefTAZidime (FORTAZ) 1 g in sodium chloride 0.9 % 100 mL IVPB     1 g 200 mL/hr over 30 Minutes Intravenous Every 24 hours 07/12/18 1449 07/22/18 2306   07/10/18 1430  vancomycin (VANCOCIN) IVPB 1000 mg/200 mL premix  Status:  Discontinued     1,000 mg 200 mL/hr over 60 Minutes Intravenous Every 48 hours 07/08/18 1421 07/09/18 0959   07/10/18 1000  cefTRIAXone (ROCEPHIN) 2 g in sodium chloride 0.9 % 100 mL IVPB  Status:  Discontinued     2 g 200 mL/hr over 30 Minutes Intravenous Every 24 hours 07/10/18 0942 07/12/18 1433   07/10/18 0847  cefTRIAXone (ROCEPHIN) 2 g in sodium chloride  0.9 % 100 mL IVPB  Status:  Discontinued     2 g 200 mL/hr over 30 Minutes Intravenous Every 24 hours 07/09/18 1004 07/10/18 0933   07/09/18 1400  acyclovir (ZOVIRAX) 745 mg in dextrose 5 % 150 mL IVPB  Status:  Discontinued     10 mg/kg  74.5 kg 164.9 mL/hr over 60  Minutes Intravenous Every 24 hours 07/08/18 1421 07/09/18 0959   07/09/18 1300  bictegravir-emtricitabine-tenofovir AF (BIKTARVY) 50-200-25 MG per tablet 1 tablet     1 tablet Oral Daily 07/09/18 1012     07/08/18 2300  cefTRIAXone (ROCEPHIN) 2 g in sodium chloride 0.9 % 100 mL IVPB  Status:  Discontinued     2 g 200 mL/hr over 30 Minutes Intravenous Every 12 hours 07/08/18 1341 07/09/18 1004   07/08/18 1800  vancomycin (VANCOCIN) IVPB 1000 mg/200 mL premix  Status:  Discontinued     1,000 mg 200 mL/hr over 60 Minutes Intravenous Every 48 hours 07/06/18 2019 07/07/18 1137   07/08/18 1430  ampicillin (OMNIPEN) 2 g in sodium chloride 0.9 % 100 mL IVPB  Status:  Discontinued     2 g 300 mL/hr over 20 Minutes Intravenous Every 12 hours 07/08/18 1343 07/09/18 0959   07/08/18 1430  vancomycin (VANCOCIN) 1,500 mg in sodium chloride 0.9 % 500 mL IVPB     1,500 mg 250 mL/hr over 120 Minutes Intravenous  Once 07/08/18 1421 07/08/18 1723   07/08/18 1400  acyclovir (ZOVIRAX) 745 mg in dextrose 5 % 150 mL IVPB     10 mg/kg  74.5 kg 164.9 mL/hr over 60 Minutes Intravenous  Once 07/08/18 1345 07/09/18 1315   07/07/18 1800  ceFEPIme (MAXIPIME) 1 g in sodium chloride 0.9 % 100 mL IVPB  Status:  Discontinued     1 g 200 mL/hr over 30 Minutes Intravenous Every 24 hours 07/06/18 2019 07/07/18 1137   07/07/18 1145  cefTRIAXone (ROCEPHIN) 2 g in sodium chloride 0.9 % 100 mL IVPB  Status:  Discontinued     2 g 200 mL/hr over 30 Minutes Intravenous Every 24 hours 07/07/18 1137 07/08/18 1341   07/07/18 1000  bictegravir-emtricitabine-tenofovir AF (BIKTARVY) 50-200-25 MG per tablet 1 tablet  Status:  Discontinued     1 tablet Oral Daily 07/07/18 0058 07/07/18 1127   07/06/18 1900  ceFEPIme (MAXIPIME) 2 g in sodium chloride 0.9 % 100 mL IVPB     2 g 200 mL/hr over 30 Minutes Intravenous  Once 07/06/18 1859 07/06/18 2001   07/06/18 1900  vancomycin (VANCOCIN) IVPB 1000 mg/200 mL premix     1,000 mg 200 mL/hr  over 60 Minutes Intravenous  Once 07/06/18 1859 07/06/18 2035      Procedures: 2/21>> right IJ tunneled HD catheter, and AV fistula placement by vascular surgery 2/11>>LP 2/10>> IR guided HD catheter placement  CONSULTS:  cardiology, nephrology, neurology and vascular surgery  ID  Time spent: 15-minutes-Greater than 50% of this time was spent in counseling, explanation of diagnosis, planning of further management, and coordination of care.  MEDICATIONS: Scheduled Meds: . bictegravir-emtricitabine-tenofovir AF  1 tablet Oral Daily  . Chlorhexidine Gluconate Cloth  6 each Topical Q0600  . feeding supplement (ENSURE ENLIVE)  237 mL Oral BID BM  . insulin aspart  0-9 Units Subcutaneous TID WC  . mouth rinse  15 mL Mouth Rinse BID  . metoprolol succinate  25 mg Oral Daily  . mometasone-formoterol  2 puff Inhalation BID  . multivitamin  1 tablet Oral QHS  .  predniSONE  40 mg Oral Q breakfast  . warfarin   Does not apply Once  . Warfarin - Pharmacist Dosing Inpatient   Does not apply q1800   Continuous Infusions: . sodium chloride 0 mL/hr at 07/15/18 0106   PRN Meds:.sodium chloride, acetaminophen **OR** acetaminophen, albuterol, HYDROcodone-acetaminophen, metoprolol tartrate, ondansetron **OR** ondansetron (ZOFRAN) IV, RESOURCE THICKENUP CLEAR, RESOURCE THICKENUP CLEAR, sodium chloride flush   PHYSICAL EXAM: Vital signs: Vitals:   07/24/18 2256 07/25/18 0552 07/25/18 0553 07/25/18 0852  BP: 139/82 (!) 149/90    Pulse: 92 85    Resp: 16 16    Temp: 98.4 F (36.9 C) 98.9 F (37.2 C)    TempSrc:      SpO2: 97% 99%  98%  Weight:   73 kg   Height:       Filed Weights   07/23/18 1200 07/23/18 1612 07/25/18 0553  Weight: 70.9 kg 68.9 kg 73 kg   Body mass index is 28.51 kg/m.   General appearance:Awake, alert, not in any distress.  Eyes:no scleral icterus. HEENT: Atraumatic and Normocephalic Neck: supple, no JVD. Resp:Good air entry bilaterally,no rales or  rhonchi CVS: S1 S2 regular, no murmurs.  GI: Bowel sounds present, Non tender and not distended with no gaurding, rigidity or rebound. Extremities: B/L Lower Ext shows no edema, both legs are warm to touch Neurology:  Non focal  I have personally reviewed following labs and imaging studies  LABORATORY DATA: CBC: Recent Labs  Lab 07/20/18 0400 07/21/18 0426 07/23/18 1708  WBC 5.2 5.1 10.4  HGB 11.7* 12.4* 12.0*  HCT 35.8* 39.1 37.4*  MCV 90.2 90.7 90.1  PLT 156 117* 121*    Basic Metabolic Panel: Recent Labs  Lab 07/19/18 1148 07/20/18 0400 07/21/18 0426 07/22/18 0523 07/23/18 1600  NA 140 138 138 137 137  K 3.3* 4.0 3.9 3.9 4.1  CL 106 104 101 102 103  CO2 24 24 27 24 22   GLUCOSE 113* 221* 90 146* 160*  BUN 59* 65* 33* 57* 83*  CREATININE 4.61* 4.95* 3.91* 5.88* 7.66*  CALCIUM 6.8* 7.0* 7.0* 6.8* 6.6*  PHOS  --   --  3.4 4.3 4.3    GFR: Estimated Creatinine Clearance: 8.5 mL/min (A) (by C-G formula based on SCr of 7.66 mg/dL (H)).  Liver Function Tests: Recent Labs  Lab 07/21/18 0426 07/22/18 0523 07/23/18 1600  ALBUMIN 2.1* 2.0* 2.0*   No results for input(s): LIPASE, AMYLASE in the last 168 hours. No results for input(s): AMMONIA in the last 168 hours.  Coagulation Profile: Recent Labs  Lab 07/21/18 0426 07/22/18 0523 07/23/18 0347 07/24/18 1004 07/25/18 0440  INR 1.17 1.45 1.63 2.0* 2.1*    Cardiac Enzymes: No results for input(s): CKTOTAL, CKMB, CKMBINDEX, TROPONINI in the last 168 hours.  BNP (last 3 results) No results for input(s): PROBNP in the last 8760 hours.  HbA1C: No results for input(s): HGBA1C in the last 72 hours.  CBG: Recent Labs  Lab 07/24/18 0415 07/24/18 0818 07/24/18 1218 07/24/18 1621 07/25/18 0954  GLUCAP 156* 83 181* 225* 224*    Lipid Profile: No results for input(s): CHOL, HDL, LDLCALC, TRIG, CHOLHDL, LDLDIRECT in the last 72 hours.  Thyroid Function Tests: No results for input(s): TSH, T4TOTAL,  FREET4, T3FREE, THYROIDAB in the last 72 hours.  Anemia Panel: No results for input(s): VITAMINB12, FOLATE, FERRITIN, TIBC, IRON, RETICCTPCT in the last 72 hours.  Urine analysis:    Component Value Date/Time   COLORURINE AMBER (A) 07/08/2018 1540  APPEARANCEUR CLOUDY (A) 07/08/2018 0839   LABSPEC 1.016 07/08/2018 0839   PHURINE 5.0 07/08/2018 0839   GLUCOSEU NEGATIVE 07/08/2018 0839   HGBUR SMALL (A) 07/08/2018 0839   BILIRUBINUR NEGATIVE 07/08/2018 0839   KETONESUR 5 (A) 07/08/2018 0839   PROTEINUR 100 (A) 07/08/2018 0839   UROBILINOGEN 0.2 10/23/2013 1545   NITRITE NEGATIVE 07/08/2018 0839   LEUKOCYTESUR LARGE (A) 07/08/2018 0839    Sepsis Labs: Lactic Acid, Venous    Component Value Date/Time   LATICACIDVEN 1.5 07/08/2018 1952    MICROBIOLOGY: No results found for this or any previous visit (from the past 240 hour(s)).  RADIOLOGY STUDIES/RESULTS: Ct Abdomen Pelvis Wo Contrast  Result Date: 07/12/2018 CLINICAL DATA:  Bacteremia EXAM: CT ABDOMEN AND PELVIS WITHOUT CONTRAST TECHNIQUE: Multidetector CT imaging of the abdomen and pelvis was performed following the standard protocol without IV contrast. COMPARISON:  None. FINDINGS: Lower chest: Lung bases demonstrate mild atelectatic changes without sizable effusion. Coronary calcifications are seen. Defibrillator is again noted. Hepatobiliary: No focal liver abnormality is seen. No gallstones, gallbladder wall thickening, or biliary dilatation. Pancreas: Unremarkable. No pancreatic ductal dilatation or surrounding inflammatory changes. Spleen: Normal in size without focal abnormality. Adrenals/Urinary Tract: Adrenal glands are within normal limits. Kidneys are well visualized bilaterally. Right renal cyst is seen. A smaller left renal cyst is noted as well no renal calculi or obstructive changes are seen. The ureters are within normal limits bilaterally. The bladder is partially distended. Stomach/Bowel: Diverticular changes noted  within the colon without evidence of diverticulitis. The appendix is within normal limits. No small bowel inflammatory changes are seen. Stomach is decompressed. Vascular/Lymphatic: Aortic atherosclerosis. No enlarged abdominal or pelvic lymph nodes. Reproductive: Prostate is unremarkable. Other: No abdominal wall hernia or abnormality. No abdominopelvic ascites. Musculoskeletal: Degenerative changes of the lumbar spine are seen. IMPRESSION: Diverticulosis without diverticulitis. Bilateral renal cysts. Mild bibasilar atelectasis. Electronically Signed   By: Inez Catalina M.D.   On: 07/12/2018 19:09   Dg Chest 1 View  Result Date: 07/07/2018 CLINICAL DATA:  68 year old male with history of tachycardia. EXAM: CHEST  1 VIEW COMPARISON:  Chest x-ray 07/06/2018. FINDINGS: Status post median sternotomy. External defibrillator pad projecting over the lower left hemithorax. Left-sided pacemaker/AICD with lead tip projecting over the expected location of the right ventricular apex. Lung volumes are slightly low. No acute consolidative airspace disease. Small left pleural effusion. No right pleural effusion. There is cephalization of the pulmonary vasculature and slight indistinctness of the interstitial markings suggestive of mild pulmonary edema. Mild cardiomegaly. The patient is rotated to the left on today's exam, resulting in distortion of the mediastinal contours and reduced diagnostic sensitivity and specificity for mediastinal pathology. Aortic atherosclerosis. IMPRESSION: 1. Mild interstitial pulmonary edema and small left pleural effusion in the setting of mild cardiomegaly; imaging findings concerning for congestive heart failure. 2. Aortic atherosclerosis. 3. Postoperative changes and support apparatus, as above. Electronically Signed   By: Vinnie Langton M.D.   On: 07/07/2018 11:54   Ct Head Wo Contrast  Result Date: 07/11/2018 CLINICAL DATA:  Initial evaluation for acute altered mental status, asymmetric  left-sided weakness with left facial droop. EXAM: CT HEAD WITHOUT CONTRAST TECHNIQUE: Contiguous axial images were obtained from the base of the skull through the vertex without intravenous contrast. COMPARISON:  Prior CT from 07/06/2018. FINDINGS: Brain: Age advanced cerebral atrophy with chronic microvascular ischemic disease, stable. Remote right MCA and/or MCA/PCA watershed territory infarct involving the right temporal occipital region noted, stable. Multiple chronic lacunar infarcts seen involving  the bilateral basal ganglia, thalami, pons, and cerebellum. Specifically, previously noted right basal ganglia lacunar infarcts are stable from previous exam, likely chronic. No acute intracranial hemorrhage. No new or evolving acute large vessel territory infarct. No mass lesion, midline shift or mass effect. No hydrocephalus. No extra-axial fluid collection. Vascular: No hyperdense vessel. Calcified atherosclerosis at the skull base. Skull: Scalp soft tissues and calvarium within normal limits. Sinuses/Orbits: Globes and orbital soft tissues within normal limits. Scattered mucosal thickening with superimposed air-fluid levels noted within the paranasal sinuses, worsened from previous. Mastoid air cells are clear. Other: None. IMPRESSION: 1. Stable head CT, with no new acute intracranial abnormality. 2. In band cerebral atrophy with chronic small vessel ischemic disease with multiple chronic ischemic infarcts as above. 3. Acute pan sinusitis, worsened from previous. Electronically Signed   By: Jeannine Boga M.D.   On: 07/11/2018 17:20   Ct Head Wo Contrast  Result Date: 07/06/2018 CLINICAL DATA:  Altered mental status. History of HIV, hypertension, hypercholesterolemia and diabetes. EXAM: CT HEAD WITHOUT CONTRAST TECHNIQUE: Contiguous axial images were obtained from the base of the skull through the vertex without intravenous contrast. COMPARISON:  CT HEAD July 21, 2016 FINDINGS: BRAIN: No  intraparenchymal hemorrhage, mass effect, midline shift or acute large vascular territory infarcts. Old small RIGHT cerebellar and pontine infarcts. RIGHT temporal occipital encephalomalacia. New subcentimeter RIGHT internal capsule versus basal ganglia hypodensities. Old RIGHT basal ganglia infarct with mild ex vacuo dilatation RIGHT lateral ventricle. Old small bilateral thalami and LEFT basal ganglia infarcts. Confluent supratentorial white matter hypodensities. Moderate parenchymal brain volume loss. No hydrocephalus. VASCULAR: Moderate calcific atherosclerosis of the carotid siphons. SKULL: No skull fracture. No significant scalp soft tissue swelling. SINUSES/ORBITS: Mild paranasal sinus mucosal thickening. Chronic dehiscence of the RIGHT posterolateral maxillary wall. Mastoid air cells are well aerated.The included ocular globes and orbital contents are non-suspicious. OTHER: Bilateral parotid sialoliths. IMPRESSION: 1. New age indeterminate RIGHT basal ganglia small infarcts. 2. Old RIGHT temporal occipital/PCA territory infarct. 3. Old basal ganglia, thalami, pontine and cerebellar small infarcts. Moderate to severe chronic small vessel ischemic changes. 4. Moderate parenchymal brain volume loss, advanced for age. Electronically Signed   By: Elon Alas M.D.   On: 07/06/2018 22:16   US Renal  Result Date: 07/07/2018 CLINICAL DATA:  Acute kidney injury, history cardiomyopathy, chronic systolic heart failure, stage III chronic kidney disease, hypertension, COPD, former smoker EXAM: RENAL / URINARY TRACT ULTRASOUND COMPLETE COMPARISON:  09/05/2016 FINDINGS: Right Kidney: Renal measurements: 10.9.7 x 6.1 cm = volume: 200 mL. Cortical thinning. Increased cortical echogenicity. Tiny mid renal cyst 8 x 6.9 mm. Additional peripelvic cyst 3.0 x 3.2 x 3.1 cm, simple features. No additional mass or hydronephrosis. No shadowing calculi. Left Kidney: Renal measurements: 12.3 x 6.6 x 6.6 cm = volume: 276 mL.  Cortical thinning. Increased cortical echogenicity. Small cyst at inferior pole 1.0 x 0.9 x 1.2 cm. Additional small cyst at inferior pole 2.0 x 1.6 x 1.9 cm, simple features. No additional mass, hydronephrosis, or shadowing calcification. Bladder: Decompressed, unable to evaluate. IMPRESSION: Medical renal disease changes of both kidneys. Small renal cysts in both kidneys. No evidence of hydronephrosis. Electronically Signed   By: Lavonia Dana M.D.   On: 07/07/2018 19:30   Ir Fluoro Guide Cv Line Right  Result Date: 07/09/2018 INDICATION: Acute uremia, acute kidney injury, no current access for dialysis EXAM: Ultrasound guidance for vascular access Right IJ temporary dialysis catheter (Mahurkar catheter) MEDICATIONS: 1% lidocaine local ANESTHESIA/SEDATION: Moderate Sedation Time: None. The  patient's level of consciousness and vital signs were monitored continuously by radiology nursing throughout the procedure under my direct supervision. FLUOROSCOPY TIME:  Fluoroscopy Time: 0 minutes 24 seconds (2 mGy). COMPLICATIONS: None immediate. PROCEDURE: Emergency consent was obtained from the patient's physician after a thorough discussion of the procedural risks, benefits and alternatives. All questions were addressed. Maximal Sterile Barrier Technique was utilized including caps, mask, sterile gowns, sterile gloves, sterile drape, hand hygiene and skin antiseptic. A timeout was performed prior to the initiation of the procedure. Under sterile conditions and local anesthesia, ultrasound micropuncture access performed of the right internal jugular vein. Images obtained for documentation of the patent jugular vein. Guidewire advanced easily. Measurements obtained. Four [place] dilator advanced. Amplatz guidewire advanced into the IVC. Tract dilatation performed to insert a temporary dialysis catheter. Tip position at the SVC RA junction. Images obtained for documentation. Catheter secured Prolene sutures. Blood aspirated  easily followed by saline and heparin flushes. Appropriate volume and strength of heparin instilled in all lumens. External caps applied. Catheter secured with Prolene sutures and a sterile dressing. No immediate complication. Patient tolerated the procedure well. IMPRESSION: Successful ultrasound and fluoroscopic right IJ temporary dialysis catheter. Tip SVC RA junction. Ready for use. Electronically Signed   By: Jerilynn Mages.  Shick M.D.   On: 07/09/2018 13:06   Ir US Guide Vasc Access Right  Result Date: 07/09/2018 INDICATION: Acute uremia, acute kidney injury, no current access for dialysis EXAM: Ultrasound guidance for vascular access Right IJ temporary dialysis catheter (Mahurkar catheter) MEDICATIONS: 1% lidocaine local ANESTHESIA/SEDATION: Moderate Sedation Time: None. The patient's level of consciousness and vital signs were monitored continuously by radiology nursing throughout the procedure under my direct supervision. FLUOROSCOPY TIME:  Fluoroscopy Time: 0 minutes 24 seconds (2 mGy). COMPLICATIONS: None immediate. PROCEDURE: Emergency consent was obtained from the patient's physician after a thorough discussion of the procedural risks, benefits and alternatives. All questions were addressed. Maximal Sterile Barrier Technique was utilized including caps, mask, sterile gowns, sterile gloves, sterile drape, hand hygiene and skin antiseptic. A timeout was performed prior to the initiation of the procedure. Under sterile conditions and local anesthesia, ultrasound micropuncture access performed of the right internal jugular vein. Images obtained for documentation of the patent jugular vein. Guidewire advanced easily. Measurements obtained. Four [place] dilator advanced. Amplatz guidewire advanced into the IVC. Tract dilatation performed to insert a temporary dialysis catheter. Tip position at the SVC RA junction. Images obtained for documentation. Catheter secured Prolene sutures. Blood aspirated easily followed by  saline and heparin flushes. Appropriate volume and strength of heparin instilled in all lumens. External caps applied. Catheter secured with Prolene sutures and a sterile dressing. No immediate complication. Patient tolerated the procedure well. IMPRESSION: Successful ultrasound and fluoroscopic right IJ temporary dialysis catheter. Tip SVC RA junction. Ready for use. Electronically Signed   By: Jerilynn Mages.  Shick M.D.   On: 07/09/2018 13:06   Dg Chest Port 1 View  Result Date: 07/20/2018 CLINICAL DATA:  Placement of right IJ dialysis catheter. EXAM: PORTABLE CHEST 1 VIEW COMPARISON:  07/12/2010 FINDINGS: The prior IJ catheter was removed. There is a new double lumen dialysis catheter in place. The distal tip is in the region of the right atrium and the proximal tip is likely near the cavoatrial junction. No complicating features. The right ventricular pacer wire is stable. Stable surgical changes from bypass surgery. The heart remains enlarged. There is tortuosity and calcification of the thoracic aorta. Mild vascular congestion without overt pulmonary edema. No pleural effusions or  pneumothorax. IMPRESSION: Right IJ dialysis catheter in good position without complicating features. Cardiac enlargement and vascular congestion without overt pulmonary edema. Electronically Signed   By: Marijo Sanes M.D.   On: 07/20/2018 10:53   Dg Chest Port 1 View  Result Date: 07/12/2018 CLINICAL DATA:  Wheezing EXAM: PORTABLE CHEST 1 VIEW COMPARISON:  07/11/2018 FINDINGS: Right IJ approach hemodialysis catheter tip is in the right atrium. Unchanged position of left chest wall single lead AICD. Mild interstitial pulmonary edema. Mild cardiomegaly with findings of prior CABG. Trace left pleural effusion. IMPRESSION: Mild cardiomegaly and mild interstitial pulmonary edema. Electronically Signed   By: Ulyses Jarred M.D.   On: 07/12/2018 04:32   Dg Chest Port 1 View  Result Date: 07/11/2018 CLINICAL DATA:  Shortness of breath EXAM:  PORTABLE CHEST 1 VIEW COMPARISON:  07/07/2018 FINDINGS: Left chest wall AICD lead is in unchanged position. There is mild cardiomegaly. Central pulmonary vascular congestion without overt pulmonary edema. No focal airspace consolidation. Small left pleural effusion. No pneumothorax. IMPRESSION: Cardiomegaly and small left pleural effusion without overt pulmonary edema. Electronically Signed   By: Ulyses Jarred M.D.   On: 07/11/2018 03:35   Dg Chest Port 1 View  Result Date: 07/06/2018 CLINICAL DATA:  Sepsis EXAM: PORTABLE CHEST 1 VIEW COMPARISON:  Portable exam 2023 hours compared to 12/09/2016 FINDINGS: LEFT subclavian AICD with lead projecting over RIGHT ventricle. External pacing lead present. Enlargement of cardiac silhouette with postsurgical changes of CABG. Atherosclerotic calcification aorta. Mediastinal contours normal. Subsegmental atelectasis LEFT lower lobe. Pulmonary vascular congestion with interstitial prominence at the mid to lower lungs which could represent minimal pulmonary edema. No pleural effusion or pneumothorax. Bones demineralized. IMPRESSION: Enlargement of cardiac silhouette with vascular congestion and question minimal pulmonary edema. Electronically Signed   By: Lavonia Dana M.D.   On: 07/06/2018 20:35   Dg Swallowing Func-speech Pathology  Result Date: 07/14/2018 Objective Swallowing Evaluation: Type of Study: MBS-Modified Barium Swallow Study  Patient Details Name: EKIN PILAR MRN: [id number] Date of Birth: [birth year] Today's Date: 07/14/2018 Time: SLP Start Time (ACUTE ONLY): 9 -SLP Stop Time (ACUTE ONLY): 1150 SLP Time Calculation (min) (ACUTE ONLY): 20 min Past Medical History: Past Medical History: Diagnosis Date . AICD (automatic cardioverter/defibrillator) present  . Alcoholism in remission ([place])  . Anemia  . Aortic insufficiency  . Arthritis  . At moderate risk for fall  . Bell's palsy  . Chronic systolic heart failure (HCC)   NYHA Class III . CKD (chronic kidney  disease), stage IV ([place])  . COPD (chronic obstructive pulmonary disease) ([place])  . Essential hypertension, benign  . HIV disease ([place]) 09/06/2016 . Hyperlipidemia  . NICM (nonischemic cardiomyopathy) ([place])  . Noncompliance with medication regimen  . NSVT (nonsustained ventricular tachycardia) ([place])  . Numbness of right jaw   Had a stoke in 01/2013. Numbness is occasional, especially when trying to chew. . OSA (obstructive sleep apnea)  . Productive cough 08/2013  With brown sputum  . S/P aortic valve replacement with bioprosthetic valve  . Stroke ([place]) 01/2013  weakness of right side from CVA . Type 2 diabetes mellitus ([place])  . Uses hearing aid 2014  recently received new hearing aids Past Surgical History: Past Surgical History: Procedure Laterality Date . BIOPSY N/A 04/10/2014  Procedure: GASTRIC BIOPSY;  Surgeon: Daneil Dolin, MD;  Location: [facility] ORS;  Service: Endoscopy;  Laterality: N/A; . BIOPSY  10/12/2015  Procedure: BIOPSY;  Surgeon: Daneil Dolin, MD;  Location: [facility] END[facility] SU[facility];  Service: Endoscopy;;  stomach bx's . CARDIAC DEFIBRILLATOR PLACEMENT  11/12/12  [employer] Inogen MINI ICD implanted in [place] at [facility] per Dr Nelly Laurence note . COLONOSCOPY WITH PROPOFOL N/A 04/10/2014  RMR: Colonic Diverticulosis . ESOPHAGOGASTRODUODENOSCOPY (EGD) WITH PROPOFOL N/A 04/10/2014  RMR: Mild erosive reflux esophagitis. Multiple antral polyps likely hyperplastic status post removal by hot snare cautery technique. Diffusely abnormal stomach status post gastric biopsy I suspect some of patients anemaia may be due to intermittent oozing from the stomach. It would be difficult and a risky proposition to attempt complete removal of all of his gastric polyps. . ESOPHAGOGASTRODUODENOSCOPY (EGD) WITH PROPOFOL N/A 10/12/2015  Procedure: ESOPHAGOGASTRODUODENOSCOPY (EGD) WITH PROPOFOL;  Surgeon: Daneil Dolin, MD;  Location: [facility];  Service: Endoscopy;  Laterality:  N/A;  2119 - moved to 9:15 . IR FLUORO GUIDE CV LINE RIGHT  07/09/2018 . IR US GUIDE VASC ACCESS RIGHT  07/09/2018 . POLYPECTOMY N/A 04/10/2014  Procedure: GASTRIC POLYPECTOMY;  Surgeon: Daneil Dolin, MD;  Location: [facility];  Service: Endoscopy;  Laterality: N/A; . RIGHT HEART CATHETERIZATION N/A 02/06/2014  Procedure: RIGHT HEART CATH;  Surgeon: Larey Dresser, MD;  Location: [facility] CATH LAB;  Service: Cardiovascular;  Laterality: N/A; HPI: STOY FENN is a 68 y.o. male with PMH significant of HIV, type 2 diabetes, COPD, CVA (41740, HTN, Bells Palsy, systolic CHF, CKD, and history of AICD placement followed by cardiology.  Pt was admitted to [facility] ED 07/07/18 and after presenting with AMS at SNF residence, vomitted, and became increasingly unresponsive. Per PA note, pt was found to be encephalopathic secondary to sepsis and renal failure. Pt seen by ST in Jan 2018; BSE recc D2/thin. CXR showed cardiomegaly and small left pleural effusion. Head CT revealed new age indeterminate RIGHT basal ganglia small infarcts, old RIGHT temporal occipital/PCA territory infarct, old basal ganglia, thalami, pontine and cerebellar small infarcts.  Subjective: alert, cooperative Assessment / Plan / Recommendation CHL IP CLINICAL IMPRESSIONS 07/14/2018 Clinical Impression Pt presents with primary moderate oral dysphagia, mild pharyngeal dysphagia. There is lingual pumping, decreased bolus cohesion, piecemeal deglutition and premature spillage (to pyriforms with liquids, valleculae with solids). Premature spillage results in intermittent deep larygneal penetration of thin, nectar, and honey thick liquids, with aspiration (sensed and silent) of thin and nectar. Due to impulsivity pt at risk for aspiration with liquids of any consistency. With solids, pt without rotary chew, lingual thrusting noted with poor cohesion and spillage of solids to the valleculae. SLP used straw to facilitate chin tuck, which prevented aspiration with thin liquids,  including small and large sips and consecutive boluses. There was trace shallow penetration with chin tuck which reversed with swallow. Brief stasis of barium tablet in mid-thoracic esophagus, cleared pt's GE junction with liquid wash. Recommend dys 2, thin liquids, meds whole in puree; pt must tuck chin when swallowing for airway protection. Recommend full supervision due to level of cuing necessary. SLP will follow for tolerance and training in swallow precautions/compensations.  SLP Visit Diagnosis Dysphagia, oral phase (R13.11);Dysphagia, pharyngeal phase (R13.13) Attention and concentration deficit following -- Frontal lobe and executive function deficit following -- Impact on safety and function Moderate aspiration risk   CHL IP TREATMENT RECOMMENDATION 07/14/2018 Treatment Recommendations Therapy as outlined in treatment plan below   Prognosis 07/14/2018 Prognosis for Safe Diet Advancement Good Barriers to Reach Goals Cognitive deficits Barriers/Prognosis Comment -- CHL IP DIET RECOMMENDATION 07/14/2018 SLP Diet Recommendations Dysphagia 2 (Fine chop) solids;Thin liquid Liquid Administration  via Straw Medication Administration Whole meds with puree Compensations Slow rate;Small sips/bites;Chin tuck;Clear throat intermittently;Use straw to facilitate chin tuck Postural Changes --   CHL IP OTHER RECOMMENDATIONS 07/14/2018 Recommended Consults -- Oral Care Recommendations Oral care BID Other Recommendations --   CHL IP FOLLOW UP RECOMMENDATIONS 07/14/2018 Follow up Recommendations Skilled Nursing facility   [facility] IP FREQUENCY AND DURATION 07/14/2018 Speech Therapy Frequency (ACUTE ONLY) min 2x/week Treatment Duration 2 weeks      CHL IP ORAL PHASE 07/14/2018 Oral Phase Impaired Oral - Pudding Teaspoon -- Oral - Pudding Cup -- Oral - Honey Teaspoon NT Oral - Honey Cup Weak lingual manipulation;Lingual pumping;Reduced posterior propulsion;Piecemeal swallowing;Decreased bolus cohesion;Premature spillage Oral - Nectar  Teaspoon Weak lingual manipulation;Lingual pumping;Reduced posterior propulsion;Piecemeal swallowing;Decreased bolus cohesion;Premature spillage Oral - Nectar Cup Weak lingual manipulation;Lingual pumping;Reduced posterior propulsion;Piecemeal swallowing;Decreased bolus cohesion;Premature spillage Oral - Nectar Straw Weak lingual manipulation;Lingual pumping;Reduced posterior propulsion;Piecemeal swallowing;Delayed oral transit;Decreased bolus cohesion Oral - Thin Teaspoon Lingual pumping;Reduced posterior propulsion;Piecemeal swallowing;Decreased bolus cohesion;Premature spillage Oral - Thin Cup Weak lingual manipulation;Lingual pumping;Reduced posterior propulsion;Piecemeal swallowing;Decreased bolus cohesion;Premature spillage Oral - Thin Straw Weak lingual manipulation;Reduced posterior propulsion;Piecemeal swallowing;Decreased bolus cohesion;Premature spillage Oral - Puree Weak lingual manipulation;Lingual pumping;Reduced posterior propulsion;Piecemeal swallowing;Decreased bolus cohesion;Premature spillage Oral - Mech Soft Impaired mastication;Weak lingual manipulation;Lingual pumping;Reduced posterior propulsion;Lingual/palatal residue;Piecemeal swallowing;Delayed oral transit;Premature spillage;Decreased bolus cohesion Oral - Regular -- Oral - Multi-Consistency -- Oral - Pill Other (Comment) Oral Phase - Comment --  CHL IP PHARYNGEAL PHASE 07/14/2018 Pharyngeal Phase Impaired Pharyngeal- Pudding Teaspoon -- Pharyngeal -- Pharyngeal- Pudding Cup -- Pharyngeal -- Pharyngeal- Honey Teaspoon -- Pharyngeal -- Pharyngeal- Honey Cup Penetration/Aspiration before swallow;Penetration/Aspiration during swallow Pharyngeal Material enters airway, remains ABOVE vocal cords then ejected out;Material enters airway, remains ABOVE vocal cords and not ejected out Pharyngeal- Nectar Teaspoon -- Pharyngeal -- Pharyngeal- Nectar Cup Penetration/Aspiration before swallow;Penetration/Aspiration during swallow;Trace aspiration  Pharyngeal Material does not enter airway;Material enters airway, CONTACTS cords and not ejected out;Material enters airway, passes BELOW cords without attempt by patient to eject out (silent aspiration) Pharyngeal- Nectar Straw Penetration/Aspiration before swallow;Penetration/Aspiration during swallow;Trace aspiration Pharyngeal Material enters airway, CONTACTS cords and not ejected out;Material enters airway, passes BELOW cords without attempt by patient to eject out (silent aspiration) Pharyngeal- Thin Teaspoon Penetration/Aspiration before swallow;Penetration/Aspiration during swallow;Moderate aspiration Pharyngeal Material enters airway, passes BELOW cords and not ejected out despite cough attempt by patient Pharyngeal- Thin Cup Penetration/Aspiration before swallow;Penetration/Aspiration during swallow;Moderate aspiration Pharyngeal Material does not enter airway;Material enters airway, CONTACTS cords and not ejected out;Material enters airway, passes BELOW cords and not ejected out despite cough attempt by patient;Material enters airway, passes BELOW cords without attempt by patient to eject out (silent aspiration) Pharyngeal- Thin Straw Penetration/Aspiration during swallow;Other (Comment) Pharyngeal Material does not enter airway;Material enters airway, remains ABOVE vocal cords then ejected out Pharyngeal- Puree WFL Pharyngeal -- Pharyngeal- Mechanical Soft WFL Pharyngeal -- Pharyngeal- Regular -- Pharyngeal -- Pharyngeal- Multi-consistency -- Pharyngeal -- Pharyngeal- Pill Other (Comment) Pharyngeal -- Pharyngeal Comment --  CHL IP CERVICAL ESOPHAGEAL PHASE 07/14/2018 Cervical Esophageal Phase WFL Pudding Teaspoon -- Pudding Cup -- Honey Teaspoon -- Honey Cup -- Nectar Teaspoon -- Nectar Cup -- Nectar Straw -- Thin Teaspoon -- Thin Cup -- Thin Straw -- Puree -- Mechanical Soft -- Regular -- Multi-consistency -- Pill -- Cervical Esophageal Comment -- Deneise Lever, MS, CCC-SLP Speech-Language  Pathologist Acute Rehabilitation Services Pager: [phone] Office: [phone] Aliene Altes 07/14/2018, 1:46 PM  Dg Fluoro Guide Cv Line-no Report  Result Date: 07/20/2018 Fluoroscopy was utilized by the requesting physician.  No radiographic interpretation.   Vas [place] Upper Ext Vein Mapping (pre-op Avf)  Result Date: 07/18/2018 UPPER EXTREMITY VEIN MAPPING  Indications: Pre-access. Performing Technologist: Abram Sander RVS  Examination Guidelines: A complete evaluation includes B-mode imaging, spectral Doppler, color Doppler, and power Doppler as needed of all accessible portions of each vessel. Bilateral testing is considered an integral part of a complete examination. Limited examinations for reoccurring indications may be performed as noted. +-----------------+-------------+----------+--------------+ Right Cephalic   Diameter (cm)Depth (cm)   Findings    +-----------------+-------------+----------+--------------+ Shoulder             0.50        1.23                  +-----------------+-------------+----------+--------------+ Prox upper arm       0.55        0.84                  +-----------------+-------------+----------+--------------+ Mid upper arm        0.53        0.27                  +-----------------+-------------+----------+--------------+ Dist upper arm       0.41        0.41                  +-----------------+-------------+----------+--------------+ Antecubital fossa    0.50        0.29                  +-----------------+-------------+----------+--------------+ Prox forearm         0.24        0.22     branching    +-----------------+-------------+----------+--------------+ Mid forearm                             not visualized +-----------------+-------------+----------+--------------+ Dist forearm                            not visualized +-----------------+-------------+----------+--------------+  +-----------------+-------------+----------+--------+ Right Basilic    Diameter (cm)Depth (cm)Findings +-----------------+-------------+----------+--------+ Prox upper arm       0.36        1.36            +-----------------+-------------+----------+--------+ Mid upper arm        0.44        1.66            +-----------------+-------------+----------+--------+ Dist upper arm       0.56        0.95            +-----------------+-------------+----------+--------+ Antecubital fossa    0.64        0.50            +-----------------+-------------+----------+--------+ Prox forearm         0.29        0.21            +-----------------+-------------+----------+--------+ Mid forearm          0.32        0.20            +-----------------+-------------+----------+--------+ Distal forearm       0.30        0.20            +-----------------+-------------+----------+--------+ +-----------------+-------------+----------+---------+  Left Cephalic    Diameter (cm)Depth (cm)Findings  +-----------------+-------------+----------+---------+ Shoulder             0.40        0.98             +-----------------+-------------+----------+---------+ Prox upper arm       0.43        0.62             +-----------------+-------------+----------+---------+ Mid upper arm        0.41        0.36             +-----------------+-------------+----------+---------+ Dist upper arm       0.39        0.37             +-----------------+-------------+----------+---------+ Antecubital fossa    0.50        0.34   branching +-----------------+-------------+----------+---------+ Prox forearm         0.31        0.18             +-----------------+-------------+----------+---------+ Mid forearm          0.29        0.19             +-----------------+-------------+----------+---------+ Dist forearm         0.29        0.17              +-----------------+-------------+----------+---------+ +-----------------+-------------+----------+--------------+ Left Basilic     Diameter (cm)Depth (cm)   Findings    +-----------------+-------------+----------+--------------+ Prox upper arm       0.70        1.48                  +-----------------+-------------+----------+--------------+ Mid upper arm        0.57        1.30     branching    +-----------------+-------------+----------+--------------+ Dist upper arm                          not visualized +-----------------+-------------+----------+--------------+ Antecubital fossa                       not visualized +-----------------+-------------+----------+--------------+ Prox forearm                            not visualized +-----------------+-------------+----------+--------------+ Mid forearm                             not visualized +-----------------+-------------+----------+--------------+ Distal forearm                          not visualized +-----------------+-------------+----------+--------------+ *See table(s) above for measurements and observations.  Diagnosing physician: Servando Snare MD Electronically signed by Servando Snare MD on 07/18/2018 at 5:32:40 PM.    Final    Dg Fluoro Guide Lumbar Puncture  Result Date: 07/10/2018 CLINICAL DATA:  68 year old male with encephalopathy. Subsequent encounter. EXAM: DIAGNOSTIC LUMBAR PUNCTURE UNDER FLUOROSCOPIC GUIDANCE FLUOROSCOPY TIME:  Fluoroscopy Time:  6 seconds PROCEDURE: Patient was not able to give consent. Family (sister) not available by phone. Emergency request/consent from Dr. Candiss Norse. Labs and head CT reviewed. Under fluoroscopic guidance and aseptic technique (utilizing 1% lidocaine as local anesthetic), L3-4 lumbar puncture was performed with a single pass of a  22 gauge spinal needle. Opening pressure 26 and closing pressure 20 cc water (not felt to be accurate as patient was lying on the stomach  and moving throughout the exam). 8.5 cc of clear cerebral spinal fluid collected and sent for labs. Patient tolerated procedure well without any difficulty. IMPRESSION: Successful L3-4 lumbar puncture as noted above. Electronically Signed   By: Genia Del M.D.   On: 07/10/2018 15:12     LOS: 18 days   Oren Binet, MD  [facility]  If 7PM-7AM, please contact night-coverage  Please page via [email or web address]  Go to [email or web address] and use [place]'s universal password to access. If you do not have the password, please contact the hospital operator.  Locate the [facility] provider you are looking for under [facility] and page to a number that you can be directly reached. If you still have difficulty reaching the provider, please page the [facility] (Director on Call) for the Hospitalists listed on amion for assistance.  07/25/2018, 11:03 AM

## 2018-07-25 NOTE — Progress Notes (Signed)
Patient ID: Keith Young, male   DOB: [birth year], 68 y.o.   MRN: [id number] S: Wants some water but otherwise no new complaints O:BP (!) 149/90 (BP Location: Left Arm)   Pulse 85   Temp 98.9 F (37.2 C)   Resp 16   Ht 5\' 3"  (1.6 m)   Wt 73 kg   SpO2 98%   BMI 28.51 kg/m   Intake/Output Summary (Last 24 hours) at 07/25/2018 1228 Last data filed at 07/25/2018 0414 Gross per 24 hour  Intake 476 ml  Output 400 ml  Net 76 ml   Intake/Output: I/O last 3 completed shifts: In: 652 [P.O.:652] Out: 700 [Urine:700]  Intake/Output this shift:  No intake/output data recorded. Weight change: 2.1 kg Gen: NAD CVS: no rub Resp: cta Abd: benign, left forearm avg +T/B with some edema but without drainage or erythema Ext: no edema of lower ext but improving edema of left forearm avg  Recent Labs  Lab 07/19/18 1148 07/20/18 0400 07/21/18 0426 07/22/18 0523 07/23/18 1600  NA 140 138 138 137 137  K 3.3* 4.0 3.9 3.9 4.1  CL 106 104 101 102 103  CO2 24 24 27 24 22   GLUCOSE 113* 221* 90 146* 160*  BUN 59* 65* 33* 57* 83*  CREATININE 4.61* 4.95* 3.91* 5.88* 7.66*  ALBUMIN  --   --  2.1* 2.0* 2.0*  CALCIUM 6.8* 7.0* 7.0* 6.8* 6.6*  PHOS  --   --  3.4 4.3 4.3   Liver Function Tests: Recent Labs  Lab 07/21/18 0426 07/22/18 0523 07/23/18 1600  ALBUMIN 2.1* 2.0* 2.0*   No results for input(s): LIPASE, AMYLASE in the last 168 hours. No results for input(s): AMMONIA in the last 168 hours. CBC: Recent Labs  Lab 07/20/18 0400 07/21/18 0426 07/23/18 1708  WBC 5.2 5.1 10.4  HGB 11.7* 12.4* 12.0*  HCT 35.8* 39.1 37.4*  MCV 90.2 90.7 90.1  PLT 156 117* 121*   Cardiac Enzymes: No results for input(s): CKTOTAL, CKMB, CKMBINDEX, TROPONINI in the last 168 hours. CBG: Recent Labs  Lab 07/24/18 0415 07/24/18 0818 07/24/18 1218 07/24/18 1621 07/25/18 0954  GLUCAP 156* 83 181* 225* 224*    Iron Studies: No results for input(s): IRON, TIBC, TRANSFERRIN, FERRITIN in the last 72  hours. Studies/Results: No results found. . bictegravir-emtricitabine-tenofovir AF  1 tablet Oral Daily  . Chlorhexidine Gluconate Cloth  6 each Topical Q0600  . feeding supplement (ENSURE ENLIVE)  237 mL Oral BID BM  . insulin aspart  0-9 Units Subcutaneous TID WC  . mouth rinse  15 mL Mouth Rinse BID  . metoprolol succinate  25 mg Oral Daily  . mometasone-formoterol  2 puff Inhalation BID  . multivitamin  1 tablet Oral QHS  . predniSONE  40 mg Oral Q breakfast  . warfarin   Does not apply Once  . Warfarin - Pharmacist Dosing Inpatient   Does not apply q1800    BMET    Component Value Date/Time   NA 137 07/23/2018 1600   K 4.1 07/23/2018 1600   CL 103 07/23/2018 1600   CO2 22 07/23/2018 1600   GLUCOSE 160 (H) 07/23/2018 1600   BUN 83 (H) 07/23/2018 1600   CREATININE 7.66 (H) 07/23/2018 1600   CREATININE 2.33 (H) 07/09/2014 1145   CALCIUM 6.6 (L) 07/23/2018 1600   CALCIUM 10.3 (H) 02/14/2017 0800   GFRNONAA 7 (L) 07/23/2018 1600   GFRAA 8 (L) 07/23/2018 1600   CBC    Component Value Date/Time  WBC 10.4 07/23/2018 1708   RBC 4.15 (L) 07/23/2018 1708   HGB 12.0 (L) 07/23/2018 1708   HCT 37.4 (L) 07/23/2018 1708   PLT 121 (L) 07/23/2018 1708   MCV 90.1 07/23/2018 1708   MCH 28.9 07/23/2018 1708   MCHC 32.1 07/23/2018 1708   RDW 14.6 07/23/2018 1708   LYMPHSABS 0.7 07/18/2018 0341   MONOABS 0.5 07/18/2018 0341   EOSABS 0.0 07/18/2018 0341   BASOSABS 0.0 07/18/2018 0341     Assessment/ Plan:  1. Sepsis due to E Coli Uti- per ID completed 2 week course. TEE negative, CT showed diverticuli, and blood cultures negative 07/09/18. 2. ESRD- initially with AKI/CKD stage 4 but has remained dialysis dependent since 07/09/18. No real signs of recovery and had advanced CKD prior to admission.  1. Continue with HD q MWF.   2. CLIP process underway. 3. Anemia:Hgb >12, no ESA 4. CKD-MBD:ca/phos stable. iPTH has been low in the past will recheck. 5. Nutrition:renal  diet 6. L forearm avg- placed 07/20/18 remains edematous but improving, +T/B 7. Hypertension:stable 8. Metabolic encephalopathy- presumably due to combination of sepsis and uremia. Slow mentation but alert and awake.  9. 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 Combined CHF cardiology following 11. A fib. 12. HIV on biktarvy 13. Severe Protein malnutrition- encourage protein intake 14. Disposition- cont with PT/OT for deconditioning and assessment of needs for discharge. CLIP process underway  Donetta Potts, MD [employer] [phone]

## 2018-07-26 ENCOUNTER — Inpatient Hospital Stay (HOSPITAL_COMMUNITY): Payer: Medicare HMO | Admitting: Internal Medicine

## 2018-07-26 ENCOUNTER — Inpatient Hospital Stay
Admission: RE | Admit: 2018-07-26 | Discharge: 2019-01-10 | Disposition: A | Discharge status: Skilled Nursing Facility | Payer: Medicare HMO | Source: Ambulatory Visit | Attending: Internal Medicine | Admitting: Internal Medicine

## 2018-07-26 LAB — PROTIME-INR
INR: 2.1 — ABNORMAL HIGH (ref 0.8–1.2)
Prothrombin Time: 23.4 seconds — ABNORMAL HIGH (ref 11.4–15.2)

## 2018-07-26 LAB — GLUCOSE, CAPILLARY
Glucose-Capillary: 83 mg/dL (ref 70–99)
Glucose-Capillary: 170 mg/dL — ABNORMAL HIGH (ref 70–99)

## 2018-07-26 MED ORDER — PREDNISONE 10 MG PO TABS: ORAL_TABLET | ORAL | 0 refills | Status: DC

## 2018-07-26 MED ORDER — INSULIN ASPART 100 UNIT/ML ~~LOC~~ SOLN: SUBCUTANEOUS | 11 refills | Status: DC

## 2018-07-26 MED ORDER — MOMETASONE FURO-FORMOTEROL FUM 100-5 MCG/ACT IN AERO: 2.0000 | INHALATION_SPRAY | Freq: Two times a day (BID) | RESPIRATORY_TRACT | Status: DC

## 2018-07-26 MED ORDER — ALBUTEROL SULFATE (2.5 MG/3ML) 0.083% IN NEBU: 2.5000 mg | INHALATION_SOLUTION | RESPIRATORY_TRACT | 12 refills | Status: AC | PRN

## 2018-07-26 MED ORDER — ENSURE ENLIVE PO LIQD: 237.0000 mL | Freq: Two times a day (BID) | ORAL | 12 refills | Status: DC

## 2018-07-26 MED ORDER — WARFARIN SODIUM 5 MG PO TABS: 5.0000 mg | ORAL_TABLET | Freq: Every day | ORAL | Status: DC

## 2018-07-26 MED ORDER — METOPROLOL SUCCINATE ER 25 MG PO TB24: 25.0000 mg | ORAL_TABLET | Freq: Every day | ORAL | Status: DC

## 2018-07-26 MED ORDER — RENA-VITE PO TABS: 1.0000 | ORAL_TABLET | Freq: Every day | ORAL | 0 refills | Status: AC

## 2018-07-26 NOTE — Care Management Important Message (Signed)
Important Message  Patient Details  Name: RHETT MUTSCHLER MRN: [id number] Date of Birth: [birth year]   Medicare Important Message Given:  Yes    Carles Collet, RN 07/26/2018, 9:49 AM

## 2018-07-26 NOTE — Discharge Summary (Signed)
PATIENT DETAILS Name: Keith [name] Age: 68 y.o. Sex: male Date of Birth: 31-Oct-1950 MRN: [id number]. Admitting Physician: Etta Quill, DO [id number], Rene Kocher, MD  Admit Date: 07/06/2018 Discharge date: 07/26/2018  Recommendations for Outpatient Follow-up:  1. Follow up with PCP in 1-2 weeks 2. Please obtain BMP/CBC in one week 3. Please check INR in the next 2 days-adjust dosage of Coumadin accordingly 4. Ensure follow-up with hemo-dialysis [name] 5. Ensure will follow-up with cardiology   Admitted From:  SNF  Disposition: SNF   Home Health: No  Equipment/Devices: None  Discharge Condition: Stable  CODE STATUS: FULL CODE  Diet recommendation:  Heart Healthy / Carb Modified-dysphagia 2 diet with honey thick liquids        Brief Summary: See H&P, Labs, Consult and Test reports for all details in brief, Patient is a 68 y.o. male with history of chronic systolic heart failure with AICD in place, bioprosthetic [name], stage IV CKD, DM-2, COPD, HIV on antiretrovirals-presented with sepsis secondary to E. coli bacteremia and acute kidney injury. Unfortunately even with supportive care-he is now thought to have ESRD.  See below for further details  Brief [name] Course: Sepsis secondary to E. coli bacteremia: Sepsis pathophysiology has resolved-evaluated by infectious disease, recommendations were for 2-week duration of ceftazidime-completed on February 23.  CT abdomen negative for any foci for bacteremia.  Acute metabolic encephalopathy: Multifactorial-secondary to sepsis and also from uremia due to worsening renal function.  Encephalopathy has markedly improved-although slow-he is able to answer all my questions. Underwent LP on 2/11-CSF not consistent with meningitis.  Acute kidney injury on stage IV CKD and now thought to have ESRD: AKI likely secondary to ATN in the setting of sepsis.  Has required intermittent HD-s/p right IJ tunneled HD catheter, and left  upper extremity AV graft placement by V VS on 2/21.  No signs of recovery so far-nephrology feels like this is probably ESRD.  Has outpatient HD slot arranged in [place]-schedule is TTS.  Spoke with nephrology-okay to discharge today and to begin HD this Saturday.   HIV: Continue antiretrovirals.  PAF: Rate controlled with metoprolol-INR therapeutic.  Continue Coumadin-at SNF-monitor INR.  Thrombocytopenia: Secondary to sepsis-resolved.  Chronic systolic heart failure (EF 20-25% by TTE on 07/12/2018): Volume status is stable-diuresis with HD.  Bioprosthetic [name]: TTE on 2/13 without any obvious vegetation-repeat blood cultures on 2/10.  COPD with mild exacerbation: Continue bronchodilators-briefly on prednisone-we will taper on discharge.  DM-2 with hypoglycemia: CBGs relatively stable-continue SSI.  No further episodes of hypoglycemia  Hypertension: Controlled-continue metoprolol.  Dysphagia: Speech therapy following-on dysphagia 2 diet.  Has prior history of CVA-along with profound deconditioning-may be contributing to oropharyngeal dysphagia.  Deconditioning/debility: Secondary to acute illness-PT evaluation completed-SNF on discharge  Non-severe (moderate) malnutrition in context of chronic illness  Procedures/Studies: 2/21>> right IJ tunneled HD catheter, and AV fistula placement by vascular surgery 2/11>>LP 2/10>> IR guided HD catheter placement  Discharge Diagnoses:  Principal Problem:   E coli bacteremia Active Problems:   Chronic kidney disease (CKD), active medical management without dialysis, stage 3 (moderate) (HCC)   Diabetes mellitus type 2 in nonobese ([place])   Essential hypertension, benign   ICD (implantable cardioverter-defibrillator) in place   Acute kidney injury ([place])   HIV disease ([place])   Chronic combined systolic and diastolic heart failure ([place])   Acute metabolic encephalopathy   Septic shock ([place])   Encephalitis   ARF (acute renal failure)  ([place])   Sepsis with encephalopathy and septic  shock ([place])   Tachycardia   Diverticulosis   Malnutrition of moderate degree   Discharge Instructions:  Activity:  As tolerated with Full fall precautions use walker/cane & assistance as needed   Discharge Instructions    Diet - low sodium heart healthy   Complete by:  As directed     Diet recommendations: Dysphagia 2 (fine chop);Honey-thick liquid Liquids provided via: Cup Medication Administration: Whole meds with puree Supervision: Full supervision/cueing for compensatory strategies Compensations: Slow rate;Small sips/bites;Minimize environmental distractions;Clear throat intermittently Postural Changes and/or Swallow Maneuvers: Seated upright 90 degrees     Diet Carb Modified   Complete by:  As directed    Discharge instructions   Complete by:  As directed    Follow with Primary MD  Virgie Dad, MD in 1 week  Follow at HD [name]  Please get a complete blood count and chemistry panel checked by your Primary MD at your next visit, and again as instructed by your Primary MD.  Get Medicines reviewed and adjusted: Please take all your medications with you for your next visit with your Primary MD  Laboratory/radiological data: Please request your Primary MD to go over all [name] tests and procedure/radiological results at the follow up, please ask your Primary MD to get all [name] records sent to his/her office.  In some cases, they will be blood work, cultures and biopsy results pending at the time of your discharge. Please request that your primary care M.D. follows up on these results.  Also Note the following: If you experience worsening of your admission symptoms, develop shortness of breath, life threatening emergency, suicidal or homicidal thoughts you must seek medical attention immediately by calling 911 or calling your MD immediately  if symptoms less severe.  You must read complete  instructions/literature along with all the possible adverse reactions/side effects for all the Medicines you take and that have been prescribed to you. Take any new Medicines after you have completely understood and accpet all the possible adverse reactions/side effects.   Do not drive when taking Pain medications or sleeping medications (Benzodaizepines)  Do not take more than prescribed Pain, Sleep and Anxiety Medications. It is not advisable to combine anxiety,sleep and pain medications without talking with your primary care practitioner  Special Instructions: If you have smoked or chewed Tobacco  in the last 2 yrs please stop smoking, stop any regular Alcohol  and or any Recreational drug use.  Wear Seat belts while driving.  Please note: You were cared for by a hospitalist during your [name] stay. Once you are discharged, your primary care physician will handle any further medical issues. Please note that NO REFILLS for any discharge medications will be authorized once you are discharged, as it is imperative that you return to your primary care physician (or establish a relationship with a primary care physician if you do not have one) for your post [name] discharge needs so that they can reassess your need for medications and monitor your lab values.   Increase activity slowly   Complete by:  As directed      Allergies as of 07/26/2018   No Known Allergies     Medication List    STOP taking these medications   aspirin 325 MG tablet   carvedilol 3.125 MG tablet Commonly known as:  COREG   digoxin 0.125 MG tablet Commonly known as:  LANOXIN   isosorbide mononitrate 30 MG 24 hr tablet Commonly known as:  IMDUR   multivitamin  with minerals Tabs tablet   sitaGLIPtin 50 MG tablet Commonly known as:  JANUVIA     TAKE these medications   acetaminophen 325 MG tablet Commonly known as:  TYLENOL Take 2 tablets (650 mg total) by mouth every 4 (four) hours as needed for mild  pain (or temp > 37.5 C (99.5 F)).   albuterol (2.5 MG/3ML) 0.083% nebulizer solution Commonly known as:  PROVENTIL Take 3 mLs (2.5 mg total) by nebulization every 4 (four) hours as needed for wheezing or shortness of breath.   atorvastatin 80 MG tablet Commonly known as:  LIPITOR Take 80 mg by mouth daily.   bictegravir-emtricitabine-tenofovir AF 50-200-25 MG Tabs tablet Commonly known as:  BIKTARVY Take 1 tablet by mouth daily.   coal tar 0.5 % shampoo Commonly known as:  NEUTROGENA T-GEL Apply to scalp on shower days (Twice a week) for dandruff and dry scalp Wed., and Sat.   docusate sodium 100 MG capsule Commonly known as:  COLACE Take 100 mg by mouth daily.   feeding supplement (ENSURE ENLIVE) Liqd Take 237 mLs by mouth 2 (two) times daily between meals.   insulin aspart 100 UNIT/ML injection Commonly known as:  novoLOG 0-9 Units, Subcutaneous, 3 times daily with meals CBG < 70: implement hypoglycemia protocol CBG 70 - 120: 0 units CBG 121 - 150: 1 unit CBG 151 - 200: 2 units CBG 201 - 250: 3 units CBG 251 - 300: 5 units CBG 301 - 350: 7 units CBG 351 - 400: 9 units CBG > 400: call MD   metoprolol succinate 25 MG 24 hr tablet Commonly known as:  TOPROL-XL Take 1 tablet (25 mg total) by mouth daily.   mometasone-formoterol 100-5 MCG/ACT Aero Commonly known as:  DULERA Inhale 2 puffs into the lungs 2 (two) times daily.   multivitamin Tabs tablet Take 1 tablet by mouth at bedtime.   predniSONE 10 MG tablet Commonly known as:  DELTASONE Take 3 tablets (30 mg) daily for 1 days, then, Take 2 tablets (20 mg) daily for 1 days, then, Take 1 tablets (10 mg) daily for 1 days, then stop   ranitidine 150 MG tablet Commonly known as:  ZANTAC Take 150 mg by mouth at bedtime.   SENEXON-S 8.6-50 MG tablet Generic drug:  senna-docusate Take 1 tablet by mouth daily.   tamsulosin 0.4 MG Caps capsule Commonly known as:  FLOMAX Take 0.4 mg by mouth daily.   Vitamin D3  50 MCG (2000 UT) Tabs Take 2,000 mg by mouth daily.   warfarin 5 MG tablet Commonly known as:  COUMADIN Take 1 tablet (5 mg total) by mouth daily at 6 PM.       Contact information for follow-up providers    Virgie Dad, MD. Schedule an appointment as soon as possible for a visit in 1 week(s).   Specialty:  Internal Medicine Contact information: [place] [id number] [phone]        Evans Lance, MD. Schedule an appointment as soon as possible for a visit in 1 week(s).   Specialty:  Cardiology Contact information: [address] [place] [address] [phone]        [facility] in [place] and his chair time is TTS at 11:15 am Follow up.            Contact information for after-discharge care    [facility] .   Service:  Skilled Nursing Contact information:  [address] [place] [phone]                 No Known Allergies  Consultations:   cardiology, ID and nephrology  VVS   Other Procedures/Studies: Ct Abdomen Pelvis Wo Contrast  Result Date: 07/12/2018 CLINICAL DATA:  Bacteremia EXAM: CT ABDOMEN AND PELVIS WITHOUT CONTRAST TECHNIQUE: Multidetector CT imaging of the abdomen and pelvis was performed following the standard protocol without IV contrast. COMPARISON:  None. FINDINGS: Lower chest: Lung bases demonstrate mild atelectatic changes without sizable effusion. Coronary calcifications are seen. Defibrillator is again noted. Hepatobiliary: No focal liver abnormality is seen. No gallstones, gallbladder wall thickening, or biliary dilatation. Pancreas: Unremarkable. No pancreatic ductal dilatation or surrounding inflammatory changes. Spleen: Normal in size without focal abnormality. Adrenals/Urinary Tract: Adrenal glands are within normal limits. Kidneys are well visualized bilaterally. Right renal cyst is seen. A smaller left renal cyst is  noted as well no renal calculi or obstructive changes are seen. The ureters are within normal limits bilaterally. The bladder is partially distended. Stomach/Bowel: Diverticular changes noted within the colon without evidence of diverticulitis. The appendix is within normal limits. No small bowel inflammatory changes are seen. Stomach is decompressed. Vascular/Lymphatic: Aortic atherosclerosis. No enlarged abdominal or pelvic lymph nodes. Reproductive: Prostate is unremarkable. Other: No abdominal wall hernia or abnormality. No abdominopelvic ascites. Musculoskeletal: Degenerative changes of the lumbar spine are seen. IMPRESSION: Diverticulosis without diverticulitis. Bilateral renal cysts. Mild bibasilar atelectasis. Electronically Signed   By: Inez Catalina M.D.   On: 07/12/2018 19:09   Dg Chest 1 View  Result Date: 07/07/2018 CLINICAL DATA:  68 year old male with history of tachycardia. EXAM: CHEST  1 VIEW COMPARISON:  Chest x-ray 07/06/2018. FINDINGS: Status post median sternotomy. External defibrillator pad projecting over the lower left hemithorax. Left-sided pacemaker/AICD with lead tip projecting over the expected location of the right ventricular apex. Lung volumes are slightly low. No acute consolidative airspace disease. Small left pleural effusion. No right pleural effusion. There is cephalization of the pulmonary vasculature and slight indistinctness of the interstitial markings suggestive of mild pulmonary edema. Mild cardiomegaly. The patient is rotated to the left on today's exam, resulting in distortion of the mediastinal contours and reduced diagnostic sensitivity and specificity for mediastinal pathology. Aortic atherosclerosis. IMPRESSION: 1. Mild interstitial pulmonary edema and small left pleural effusion in the setting of mild cardiomegaly; imaging findings concerning for congestive heart failure. 2. Aortic atherosclerosis. 3. Postoperative changes and support apparatus, as above.  Electronically Signed   By: Vinnie Langton M.D.   On: 07/07/2018 11:54   Ct Head Wo Contrast  Result Date: 07/11/2018 CLINICAL DATA:  Initial evaluation for acute altered mental status, asymmetric left-sided weakness with left facial droop. EXAM: CT HEAD WITHOUT CONTRAST TECHNIQUE: Contiguous axial images were obtained from the base of the skull through the vertex without intravenous contrast. COMPARISON:  Prior CT from 07/06/2018. FINDINGS: Brain: Age advanced cerebral atrophy with chronic microvascular ischemic disease, stable. Remote right MCA and/or MCA/PCA watershed territory infarct involving the right temporal occipital region noted, stable. Multiple chronic lacunar infarcts seen involving the bilateral basal ganglia, thalami, pons, and cerebellum. Specifically, previously noted right basal ganglia lacunar infarcts are stable from previous exam, likely chronic. No acute intracranial hemorrhage. No new or evolving acute large vessel territory infarct. No mass lesion, midline shift or mass effect. No hydrocephalus. No extra-axial fluid collection. Vascular: No hyperdense vessel. Calcified atherosclerosis at the skull base. Skull: Scalp soft tissues and calvarium within normal  limits. Sinuses/Orbits: Globes and orbital soft tissues within normal limits. Scattered mucosal thickening with superimposed air-fluid levels noted within the paranasal sinuses, worsened from previous. Mastoid air cells are clear. Other: None. IMPRESSION: 1. Stable head CT, with no new acute intracranial abnormality. 2. In band cerebral atrophy with chronic small vessel ischemic disease with multiple chronic ischemic infarcts as above. 3. Acute pan sinusitis, worsened from previous. Electronically Signed   By: Jeannine Boga M.D.   On: 07/11/2018 17:20   Ct Head Wo Contrast  Result Date: 07/06/2018 CLINICAL DATA:  Altered mental status. History of HIV, hypertension, hypercholesterolemia and diabetes. EXAM: CT HEAD WITHOUT  CONTRAST TECHNIQUE: Contiguous axial images were obtained from the base of the skull through the vertex without intravenous contrast. COMPARISON:  CT HEAD July 21, 2016 FINDINGS: BRAIN: No intraparenchymal hemorrhage, mass effect, midline shift or acute large vascular territory infarcts. Old small RIGHT cerebellar and pontine infarcts. RIGHT temporal occipital encephalomalacia. New subcentimeter RIGHT internal capsule versus basal ganglia hypodensities. Old RIGHT basal ganglia infarct with mild ex vacuo dilatation RIGHT lateral ventricle. Old small bilateral thalami and LEFT basal ganglia infarcts. Confluent supratentorial white matter hypodensities. Moderate parenchymal brain volume loss. No hydrocephalus. VASCULAR: Moderate calcific atherosclerosis of the carotid siphons. SKULL: No skull fracture. No significant scalp soft tissue swelling. SINUSES/ORBITS: Mild paranasal sinus mucosal thickening. Chronic dehiscence of the RIGHT posterolateral maxillary wall. Mastoid air cells are well aerated.The included ocular globes and orbital contents are non-suspicious. OTHER: Bilateral parotid sialoliths. IMPRESSION: 1. New age indeterminate RIGHT basal ganglia small infarcts. 2. Old RIGHT temporal occipital/PCA territory infarct. 3. Old basal ganglia, thalami, pontine and cerebellar small infarcts. Moderate to severe chronic small vessel ischemic changes. 4. Moderate parenchymal brain volume loss, advanced for age. Electronically Signed   By: Elon Alas M.D.   On: 07/06/2018 22:16   US Renal  Result Date: 07/07/2018 CLINICAL DATA:  Acute kidney injury, history cardiomyopathy, chronic systolic heart failure, stage III chronic kidney disease, hypertension, COPD, former smoker EXAM: RENAL / URINARY TRACT ULTRASOUND COMPLETE COMPARISON:  09/05/2016 FINDINGS: Right Kidney: Renal measurements: 10.9.7 x 6.1 cm = volume: 200 mL. Cortical thinning. Increased cortical echogenicity. Tiny mid renal cyst 8 x 6.9 mm.  Additional peripelvic cyst 3.0 x 3.2 x 3.1 cm, simple features. No additional mass or hydronephrosis. No shadowing calculi. Left Kidney: Renal measurements: 12.3 x 6.6 x 6.6 cm = volume: 276 mL. Cortical thinning. Increased cortical echogenicity. Small cyst at inferior pole 1.0 x 0.9 x 1.2 cm. Additional small cyst at inferior pole 2.0 x 1.6 x 1.9 cm, simple features. No additional mass, hydronephrosis, or shadowing calcification. Bladder: Decompressed, unable to evaluate. IMPRESSION: Medical renal disease changes of both kidneys. Small renal cysts in both kidneys. No evidence of hydronephrosis. Electronically Signed   By: Lavonia Dana M.D.   On: 07/07/2018 19:30   Ir Fluoro Guide Cv Line Right  Result Date: 07/09/2018 INDICATION: Acute uremia, acute kidney injury, no current access for dialysis EXAM: Ultrasound guidance for vascular access Right IJ temporary dialysis catheter (Mahurkar catheter) MEDICATIONS: 1% lidocaine local ANESTHESIA/SEDATION: Moderate Sedation Time: None. The patient's level of consciousness and vital signs were monitored continuously by radiology nursing throughout the procedure under my direct supervision. FLUOROSCOPY TIME:  Fluoroscopy Time: 0 minutes 24 seconds (2 mGy). COMPLICATIONS: None immediate. PROCEDURE: Emergency consent was obtained from the patient's physician after a thorough discussion of the procedural risks, benefits and alternatives. All questions were addressed. Maximal Sterile Barrier Technique was utilized including caps, mask, sterile  gowns, sterile gloves, sterile drape, hand hygiene and skin antiseptic. A timeout was performed prior to the initiation of the procedure. Under sterile conditions and local anesthesia, ultrasound micropuncture access performed of the right internal jugular vein. Images obtained for documentation of the patent jugular vein. Guidewire advanced easily. Measurements obtained. Four [place] dilator advanced. Amplatz guidewire advanced into the  IVC. Tract dilatation performed to insert a temporary dialysis catheter. Tip position at the SVC RA junction. Images obtained for documentation. Catheter secured Prolene sutures. Blood aspirated easily followed by saline and heparin flushes. Appropriate volume and strength of heparin instilled in all lumens. External caps applied. Catheter secured with Prolene sutures and a sterile dressing. No immediate complication. Patient tolerated the procedure well. IMPRESSION: Successful ultrasound and fluoroscopic right IJ temporary dialysis catheter. Tip SVC RA junction. Ready for use. Electronically Signed   By: Jerilynn Mages.  Shick M.D.   On: 07/09/2018 13:06   Ir US Guide Vasc Access Right  Result Date: 07/09/2018 INDICATION: Acute uremia, acute kidney injury, no current access for dialysis EXAM: Ultrasound guidance for vascular access Right IJ temporary dialysis catheter (Mahurkar catheter) MEDICATIONS: 1% lidocaine local ANESTHESIA/SEDATION: Moderate Sedation Time: None. The patient's level of consciousness and vital signs were monitored continuously by radiology nursing throughout the procedure under my direct supervision. FLUOROSCOPY TIME:  Fluoroscopy Time: 0 minutes 24 seconds (2 mGy). COMPLICATIONS: None immediate. PROCEDURE: Emergency consent was obtained from the patient's physician after a thorough discussion of the procedural risks, benefits and alternatives. All questions were addressed. Maximal Sterile Barrier Technique was utilized including caps, mask, sterile gowns, sterile gloves, sterile drape, hand hygiene and skin antiseptic. A timeout was performed prior to the initiation of the procedure. Under sterile conditions and local anesthesia, ultrasound micropuncture access performed of the right internal jugular vein. Images obtained for documentation of the patent jugular vein. Guidewire advanced easily. Measurements obtained. Four [place] dilator advanced. Amplatz guidewire advanced into the IVC. Tract  dilatation performed to insert a temporary dialysis catheter. Tip position at the SVC RA junction. Images obtained for documentation. Catheter secured Prolene sutures. Blood aspirated easily followed by saline and heparin flushes. Appropriate volume and strength of heparin instilled in all lumens. External caps applied. Catheter secured with Prolene sutures and a sterile dressing. No immediate complication. Patient tolerated the procedure well. IMPRESSION: Successful ultrasound and fluoroscopic right IJ temporary dialysis catheter. Tip SVC RA junction. Ready for use. Electronically Signed   By: Jerilynn Mages.  Shick M.D.   On: 07/09/2018 13:06   Dg Chest Port 1 View  Result Date: 07/20/2018 CLINICAL DATA:  Placement of right IJ dialysis catheter. EXAM: PORTABLE CHEST 1 VIEW COMPARISON:  07/12/2010 FINDINGS: The prior IJ catheter was removed. There is a new double lumen dialysis catheter in place. The distal tip is in the region of the right atrium and the proximal tip is likely near the cavoatrial junction. No complicating features. The right ventricular pacer wire is stable. Stable surgical changes from bypass surgery. The heart remains enlarged. There is tortuosity and calcification of the thoracic aorta. Mild vascular congestion without overt pulmonary edema. No pleural effusions or pneumothorax. IMPRESSION: Right IJ dialysis catheter in good position without complicating features. Cardiac enlargement and vascular congestion without overt pulmonary edema. Electronically Signed   By: Marijo Sanes M.D.   On: 07/20/2018 10:53   Dg Chest Port 1 View  Result Date: 07/12/2018 CLINICAL DATA:  Wheezing EXAM: PORTABLE CHEST 1 VIEW COMPARISON:  07/11/2018 FINDINGS: Right IJ approach hemodialysis catheter tip is in the  right atrium. Unchanged position of left chest wall single lead AICD. Mild interstitial pulmonary edema. Mild cardiomegaly with findings of prior CABG. Trace left pleural effusion. IMPRESSION: Mild cardiomegaly  and mild interstitial pulmonary edema. Electronically Signed   By: Ulyses Jarred M.D.   On: 07/12/2018 04:32   Dg Chest Port 1 View  Result Date: 07/11/2018 CLINICAL DATA:  Shortness of breath EXAM: PORTABLE CHEST 1 VIEW COMPARISON:  07/07/2018 FINDINGS: Left chest wall AICD lead is in unchanged position. There is mild cardiomegaly. Central pulmonary vascular congestion without overt pulmonary edema. No focal airspace consolidation. Small left pleural effusion. No pneumothorax. IMPRESSION: Cardiomegaly and small left pleural effusion without overt pulmonary edema. Electronically Signed   By: Ulyses Jarred M.D.   On: 07/11/2018 03:35   Dg Chest Port 1 View  Result Date: 07/06/2018 CLINICAL DATA:  Sepsis EXAM: PORTABLE CHEST 1 VIEW COMPARISON:  Portable exam 2023 hours compared to 12/09/2016 FINDINGS: LEFT subclavian AICD with lead projecting over RIGHT ventricle. External pacing lead present. Enlargement of cardiac silhouette with postsurgical changes of CABG. Atherosclerotic calcification aorta. Mediastinal contours normal. Subsegmental atelectasis LEFT lower lobe. Pulmonary vascular congestion with interstitial prominence at the mid to lower lungs which could represent minimal pulmonary edema. No pleural effusion or pneumothorax. Bones demineralized. IMPRESSION: Enlargement of cardiac silhouette with vascular congestion and question minimal pulmonary edema. Electronically Signed   By: Lavonia Dana M.D.   On: 07/06/2018 20:35   Dg Swallowing Func-speech Pathology  Result Date: 07/14/2018 Objective Swallowing Evaluation: Type of Study: MBS-Modified Barium Swallow Study  Patient Details Name: LUCIA MCCREADIE MRN: [id number] Date of Birth: 1950/12/07 Today's Date: 07/14/2018 Time: SLP Start Time (ACUTE ONLY): 69 -SLP Stop Time (ACUTE ONLY): 1150 SLP Time Calculation (min) (ACUTE ONLY): 20 min Past Medical History: Past Medical History: Diagnosis Date . AICD (automatic cardioverter/defibrillator) present  .  Alcoholism in remission ([place])  . Anemia  . Aortic insufficiency  . Arthritis  . At moderate risk for fall  . Bell's palsy  . Chronic systolic heart failure (HCC)   NYHA Class III . CKD (chronic kidney disease), stage IV ([place])  . COPD (chronic obstructive pulmonary disease) ([place])  . Essential hypertension, benign  . HIV disease ([place]) 09/06/2016 . Hyperlipidemia  . NICM (nonischemic cardiomyopathy) ([place])  . Noncompliance with medication regimen  . NSVT (nonsustained ventricular tachycardia) ([place])  . Numbness of right jaw   Had a stoke in 01/2013. Numbness is occasional, especially when trying to chew. . OSA (obstructive sleep apnea)  . Productive cough 08/2013  With brown sputum  . S/P aortic valve replacement with bioprosthetic valve  . Stroke ([place]) 01/2013  weakness of right side from CVA . Type 2 diabetes mellitus ([place])  . Uses hearing aid 2014  recently received new hearing aids Past Surgical History: Past Surgical History: Procedure Laterality Date . BIOPSY N/A 04/10/2014  Procedure: GASTRIC BIOPSY;  Surgeon: Daneil Dolin, MD;  Location: [facility];  Service: Endoscopy;  Laterality: N/A; . BIOPSY  10/12/2015  Procedure: BIOPSY;  Surgeon: Daneil Dolin, MD;  Location: [facility];  Service: Endoscopy;;  stomach bx's . CARDIAC DEFIBRILLATOR PLACEMENT  11/12/12  [employer] Inogen MINI ICD implanted in [place] at [name] per Dr Nelly Laurence note . COLONOSCOPY WITH PROPOFOL N/A 04/10/2014  RMR: Colonic Diverticulosis . ESOPHAGOGASTRODUODENOSCOPY (EGD) WITH PROPOFOL N/A 04/10/2014  RMR: Mild erosive reflux esophagitis. Multiple antral polyps likely hyperplastic status post removal by hot snare cautery  technique. Diffusely abnormal stomach status post gastric biopsy I suspect some of patients anemaia may be due to intermittent oozing from the stomach. It would be difficult and a risky proposition to attempt complete removal of all of his gastric polyps. .  ESOPHAGOGASTRODUODENOSCOPY (EGD) WITH PROPOFOL N/A 10/12/2015  Procedure: ESOPHAGOGASTRODUODENOSCOPY (EGD) WITH PROPOFOL;  Surgeon: Daneil Dolin, MD;  Location: [facility];  Service: Endoscopy;  Laterality: N/A;  4562 - moved to 9:15 . IR FLUORO GUIDE CV LINE RIGHT  07/09/2018 . IR US GUIDE VASC ACCESS RIGHT  07/09/2018 . POLYPECTOMY N/A 04/10/2014  Procedure: GASTRIC POLYPECTOMY;  Surgeon: Daneil Dolin, MD;  Location: [facility];  Service: Endoscopy;  Laterality: N/A; . RIGHT HEART CATHETERIZATION N/A 02/06/2014  Procedure: RIGHT HEART CATH;  Surgeon: Larey Dresser, MD;  Location: [name] CATH LAB;  Service: Cardiovascular;  Laterality: N/A; HPI: AVRAHAM BENISH is a 68 y.o. male with PMH significant of HIV, type 2 diabetes, COPD, CVA (56389, HTN, Bells Palsy, systolic CHF, CKD, and history of AICD placement followed by cardiology.  Pt was admitted to [name] ED 07/07/18 and after presenting with AMS at SNF residence, vomitted, and became increasingly unresponsive. Per PA note, pt was found to be encephalopathic secondary to sepsis and renal failure. Pt seen by ST in Jan 2018; BSE recc D2/thin. CXR showed cardiomegaly and small left pleural effusion. Head CT revealed new age indeterminate RIGHT basal ganglia small infarcts, old RIGHT temporal occipital/PCA territory infarct, old basal ganglia, thalami, pontine and cerebellar small infarcts.  Subjective: alert, cooperative Assessment / Plan / Recommendation CHL IP CLINICAL IMPRESSIONS 07/14/2018 Clinical Impression Pt presents with primary moderate oral dysphagia, mild pharyngeal dysphagia. There is lingual pumping, decreased bolus cohesion, piecemeal deglutition and premature spillage (to pyriforms with liquids, valleculae with solids). Premature spillage results in intermittent deep larygneal penetration of thin, nectar, and honey thick liquids, with aspiration (sensed and silent) of thin and nectar. Due to impulsivity pt at risk for aspiration with liquids of any  consistency. With solids, pt without rotary chew, lingual thrusting noted with poor cohesion and spillage of solids to the valleculae. SLP used straw to facilitate chin tuck, which prevented aspiration with thin liquids, including small and large sips and consecutive boluses. There was trace shallow penetration with chin tuck which reversed with swallow. Brief stasis of barium tablet in mid-thoracic esophagus, cleared pt's GE junction with liquid wash. Recommend dys 2, thin liquids, meds whole in puree; pt must tuck chin when swallowing for airway protection. Recommend full supervision due to level of cuing necessary. SLP will follow for tolerance and training in swallow precautions/compensations.  SLP Visit Diagnosis Dysphagia, oral phase (R13.11);Dysphagia, pharyngeal phase (R13.13) Attention and concentration deficit following -- Frontal lobe and executive function deficit following -- Impact on safety and function Moderate aspiration risk   CHL IP TREATMENT RECOMMENDATION 07/14/2018 Treatment Recommendations Therapy as outlined in treatment plan below   Prognosis 07/14/2018 Prognosis for Safe Diet Advancement Good Barriers to Reach Goals Cognitive deficits Barriers/Prognosis Comment -- CHL IP DIET RECOMMENDATION 07/14/2018 SLP Diet Recommendations Dysphagia 2 (Fine chop) solids;Thin liquid Liquid Administration via Straw Medication Administration Whole meds with puree Compensations Slow rate;Small sips/bites;Chin tuck;Clear throat intermittently;Use straw to facilitate chin tuck Postural Changes --   CHL IP OTHER RECOMMENDATIONS 07/14/2018 Recommended Consults -- Oral Care Recommendations Oral care BID Other Recommendations --   CHL IP FOLLOW UP RECOMMENDATIONS 07/14/2018 Follow up Recommendations Skilled Nursing facility   [name] IP FREQUENCY AND DURATION 07/14/2018 Speech Therapy Frequency (  ACUTE ONLY) min 2x/week Treatment Duration 2 weeks      CHL IP ORAL PHASE 07/14/2018 Oral Phase Impaired Oral - Pudding Teaspoon --  Oral - Pudding Cup -- Oral - Honey Teaspoon NT Oral - Honey Cup Weak lingual manipulation;Lingual pumping;Reduced posterior propulsion;Piecemeal swallowing;Decreased bolus cohesion;Premature spillage Oral - Nectar Teaspoon Weak lingual manipulation;Lingual pumping;Reduced posterior propulsion;Piecemeal swallowing;Decreased bolus cohesion;Premature spillage Oral - Nectar Cup Weak lingual manipulation;Lingual pumping;Reduced posterior propulsion;Piecemeal swallowing;Decreased bolus cohesion;Premature spillage Oral - Nectar Straw Weak lingual manipulation;Lingual pumping;Reduced posterior propulsion;Piecemeal swallowing;Delayed oral transit;Decreased bolus cohesion Oral - Thin Teaspoon Lingual pumping;Reduced posterior propulsion;Piecemeal swallowing;Decreased bolus cohesion;Premature spillage Oral - Thin Cup Weak lingual manipulation;Lingual pumping;Reduced posterior propulsion;Piecemeal swallowing;Decreased bolus cohesion;Premature spillage Oral - Thin Straw Weak lingual manipulation;Reduced posterior propulsion;Piecemeal swallowing;Decreased bolus cohesion;Premature spillage Oral - Puree Weak lingual manipulation;Lingual pumping;Reduced posterior propulsion;Piecemeal swallowing;Decreased bolus cohesion;Premature spillage Oral - Mech Soft Impaired mastication;Weak lingual manipulation;Lingual pumping;Reduced posterior propulsion;Lingual/palatal residue;Piecemeal swallowing;Delayed oral transit;Premature spillage;Decreased bolus cohesion Oral - Regular -- Oral - Multi-Consistency -- Oral - Pill Other (Comment) Oral Phase - Comment --  CHL IP PHARYNGEAL PHASE 07/14/2018 Pharyngeal Phase Impaired Pharyngeal- Pudding Teaspoon -- Pharyngeal -- Pharyngeal- Pudding Cup -- Pharyngeal -- Pharyngeal- Honey Teaspoon -- Pharyngeal -- Pharyngeal- Honey Cup Penetration/Aspiration before swallow;Penetration/Aspiration during swallow Pharyngeal Material enters airway, remains ABOVE vocal cords then ejected out;Material enters airway,  remains ABOVE vocal cords and not ejected out Pharyngeal- Nectar Teaspoon -- Pharyngeal -- Pharyngeal- Nectar Cup Penetration/Aspiration before swallow;Penetration/Aspiration during swallow;Trace aspiration Pharyngeal Material does not enter airway;Material enters airway, CONTACTS cords and not ejected out;Material enters airway, passes BELOW cords without attempt by patient to eject out (silent aspiration) Pharyngeal- Nectar Straw Penetration/Aspiration before swallow;Penetration/Aspiration during swallow;Trace aspiration Pharyngeal Material enters airway, CONTACTS cords and not ejected out;Material enters airway, passes BELOW cords without attempt by patient to eject out (silent aspiration) Pharyngeal- Thin Teaspoon Penetration/Aspiration before swallow;Penetration/Aspiration during swallow;Moderate aspiration Pharyngeal Material enters airway, passes BELOW cords and not ejected out despite cough attempt by patient Pharyngeal- Thin Cup Penetration/Aspiration before swallow;Penetration/Aspiration during swallow;Moderate aspiration Pharyngeal Material does not enter airway;Material enters airway, CONTACTS cords and not ejected out;Material enters airway, passes BELOW cords and not ejected out despite cough attempt by patient;Material enters airway, passes BELOW cords without attempt by patient to eject out (silent aspiration) Pharyngeal- Thin Straw Penetration/Aspiration during swallow;Other (Comment) Pharyngeal Material does not enter airway;Material enters airway, remains ABOVE vocal cords then ejected out Pharyngeal- Puree WFL Pharyngeal -- Pharyngeal- Mechanical Soft WFL Pharyngeal -- Pharyngeal- Regular -- Pharyngeal -- Pharyngeal- Multi-consistency -- Pharyngeal -- Pharyngeal- Pill Other (Comment) Pharyngeal -- Pharyngeal Comment --  CHL IP CERVICAL ESOPHAGEAL PHASE 07/14/2018 Cervical Esophageal Phase WFL Pudding Teaspoon -- Pudding Cup -- Honey Teaspoon -- Honey Cup -- Nectar Teaspoon -- Nectar Cup -- Nectar  Straw -- Thin Teaspoon -- Thin Cup -- Thin Straw -- Puree -- Mechanical Soft -- Regular -- Multi-consistency -- Pill -- Cervical Esophageal Comment -- Deneise Lever, MS, CCC-SLP Speech-Language Pathologist Acute Rehabilitation Services Pager: [phone] Office: [phone] Aliene Altes 07/14/2018, 1:46 PM              Dg Cyndy Freeze Guide Cv Line-no Report  Result Date: 07/20/2018 Fluoroscopy was utilized by the requesting physician.  No radiographic interpretation.   Vas [place] Upper Ext Vein Mapping (pre-op Avf)  Result Date: 07/18/2018 UPPER EXTREMITY VEIN MAPPING  Indications: Pre-access. Performing Technologist: Abram Sander RVS  Examination Guidelines: A complete evaluation includes B-mode imaging, spectral Doppler, color Doppler, and power Doppler as needed of all  accessible portions of each vessel. Bilateral testing is considered an integral part of a complete examination. Limited examinations for reoccurring indications may be performed as noted. +-----------------+-------------+----------+--------------+ Right Cephalic   Diameter (cm)Depth (cm)   Findings    +-----------------+-------------+----------+--------------+ Shoulder             0.50        1.23                  +-----------------+-------------+----------+--------------+ Prox upper arm       0.55        0.84                  +-----------------+-------------+----------+--------------+ Mid upper arm        0.53        0.27                  +-----------------+-------------+----------+--------------+ Dist upper arm       0.41        0.41                  +-----------------+-------------+----------+--------------+ Antecubital fossa    0.50        0.29                  +-----------------+-------------+----------+--------------+ Prox forearm         0.24        0.22     branching    +-----------------+-------------+----------+--------------+ Mid forearm                             not visualized  +-----------------+-------------+----------+--------------+ Dist forearm                            not visualized +-----------------+-------------+----------+--------------+ +-----------------+-------------+----------+--------+ Right Basilic    Diameter (cm)Depth (cm)Findings +-----------------+-------------+----------+--------+ Prox upper arm       0.36        1.36            +-----------------+-------------+----------+--------+ Mid upper arm        0.44        1.66            +-----------------+-------------+----------+--------+ Dist upper arm       0.56        0.95            +-----------------+-------------+----------+--------+ Antecubital fossa    0.64        0.50            +-----------------+-------------+----------+--------+ Prox forearm         0.29        0.21            +-----------------+-------------+----------+--------+ Mid forearm          0.32        0.20            +-----------------+-------------+----------+--------+ Distal forearm       0.30        0.20            +-----------------+-------------+----------+--------+ +-----------------+-------------+----------+---------+ Left Cephalic    Diameter (cm)Depth (cm)Findings  +-----------------+-------------+----------+---------+ Shoulder             0.40        0.98             +-----------------+-------------+----------+---------+ Prox upper arm       0.43        0.62             +-----------------+-------------+----------+---------+  Mid upper arm        0.41        0.36             +-----------------+-------------+----------+---------+ Dist upper arm       0.39        0.37             +-----------------+-------------+----------+---------+ Antecubital fossa    0.50        0.34   branching +-----------------+-------------+----------+---------+ Prox forearm         0.31        0.18             +-----------------+-------------+----------+---------+ Mid forearm           0.29        0.19             +-----------------+-------------+----------+---------+ Dist forearm         0.29        0.17             +-----------------+-------------+----------+---------+ +-----------------+-------------+----------+--------------+ Left Basilic     Diameter (cm)Depth (cm)   Findings    +-----------------+-------------+----------+--------------+ Prox upper arm       0.70        1.48                  +-----------------+-------------+----------+--------------+ Mid upper arm        0.57        1.30     branching    +-----------------+-------------+----------+--------------+ Dist upper arm                          not visualized +-----------------+-------------+----------+--------------+ Antecubital fossa                       not visualized +-----------------+-------------+----------+--------------+ Prox forearm                            not visualized +-----------------+-------------+----------+--------------+ Mid forearm                             not visualized +-----------------+-------------+----------+--------------+ Distal forearm                          not visualized +-----------------+-------------+----------+--------------+ *See table(s) above for measurements and observations.  Diagnosing physician: Servando Snare MD Electronically signed by Servando Snare MD on 07/18/2018 at 5:32:40 PM.    Final    Dg Fluoro Guide Lumbar Puncture  Result Date: 07/10/2018 CLINICAL DATA:  68 year old male with encephalopathy. Subsequent encounter. EXAM: DIAGNOSTIC LUMBAR PUNCTURE UNDER FLUOROSCOPIC GUIDANCE FLUOROSCOPY TIME:  Fluoroscopy Time:  6 seconds PROCEDURE: Patient was not able to give consent. Family (sister) not available by phone. Emergency request/consent from Dr. Candiss Norse. Labs and head CT reviewed. Under fluoroscopic guidance and aseptic technique (utilizing 1% lidocaine as local anesthetic), L3-4 lumbar puncture was performed with a single pass of  a 22 gauge spinal needle. Opening pressure 26 and closing pressure 20 cc water (not felt to be accurate as patient was lying on the stomach and moving throughout the exam). 8.5 cc of clear cerebral spinal fluid collected and sent for labs. Patient tolerated procedure well without any difficulty. IMPRESSION: Successful L3-4 lumbar puncture as noted above. Electronically Signed   By: Genia Del M.D.   On: 07/10/2018 15:12  TODAY-DAY OF DISCHARGE:  Subjective:   [name] has no headache,no chest abdominal pain,no new weakness tingling or numbness, feels much better wants to go home today.  Objective:   Blood pressure 110/85, pulse 83, temperature 98.8 F (37.1 C), resp. rate 16, height 5\' 3"  (1.6 m), weight 74.9 kg, SpO2 98 %.  Intake/Output Summary (Last 24 hours) at 07/26/2018 0959 Last data filed at 07/26/2018 0412 Gross per 24 hour  Intake 595 ml  Output 2000 ml  Net -1405 ml   Filed Weights   07/25/18 0553 07/25/18 1606 07/26/18 0408  Weight: 73 kg 68.6 kg 74.9 kg    Exam: Awake Alert, Oriented *3, No new F.N deficits, Normal affect [place].AT,PERRAL Supple Neck,No JVD, No cervical lymphadenopathy appriciated.  Symmetrical Chest wall movement, Good air movement bilaterally, CTAB RRR,No Gallops,Rubs or new Murmurs, No Parasternal Heave +ve B.Sounds, Abd Soft, Non tender, No organomegaly appriciated, No rebound -guarding or rigidity. No Cyanosis, Clubbing or edema, No new Rash or bruise   PERTINENT RADIOLOGIC STUDIES: Ct Abdomen Pelvis Wo Contrast  Result Date: 07/12/2018 CLINICAL DATA:  Bacteremia EXAM: CT ABDOMEN AND PELVIS WITHOUT CONTRAST TECHNIQUE: Multidetector CT imaging of the abdomen and pelvis was performed following the standard protocol without IV contrast. COMPARISON:  None. FINDINGS: Lower chest: Lung bases demonstrate mild atelectatic changes without sizable effusion. Coronary calcifications are seen. Defibrillator is again noted. Hepatobiliary: No  focal liver abnormality is seen. No gallstones, gallbladder wall thickening, or biliary dilatation. Pancreas: Unremarkable. No pancreatic ductal dilatation or surrounding inflammatory changes. Spleen: Normal in size without focal abnormality. Adrenals/Urinary Tract: Adrenal glands are within normal limits. Kidneys are well visualized bilaterally. Right renal cyst is seen. A smaller left renal cyst is noted as well no renal calculi or obstructive changes are seen. The ureters are within normal limits bilaterally. The bladder is partially distended. Stomach/Bowel: Diverticular changes noted within the colon without evidence of diverticulitis. The appendix is within normal limits. No small bowel inflammatory changes are seen. Stomach is decompressed. Vascular/Lymphatic: Aortic atherosclerosis. No enlarged abdominal or pelvic lymph nodes. Reproductive: Prostate is unremarkable. Other: No abdominal wall hernia or abnormality. No abdominopelvic ascites. Musculoskeletal: Degenerative changes of the lumbar spine are seen. IMPRESSION: Diverticulosis without diverticulitis. Bilateral renal cysts. Mild bibasilar atelectasis. Electronically Signed   By: Inez Catalina M.D.   On: 07/12/2018 19:09   Dg Chest 1 View  Result Date: 07/07/2018 CLINICAL DATA:  68 year old male with history of tachycardia. EXAM: CHEST  1 VIEW COMPARISON:  Chest x-ray 07/06/2018. FINDINGS: Status post median sternotomy. External defibrillator pad projecting over the lower left hemithorax. Left-sided pacemaker/AICD with lead tip projecting over the expected location of the right ventricular apex. Lung volumes are slightly low. No acute consolidative airspace disease. Small left pleural effusion. No right pleural effusion. There is cephalization of the pulmonary vasculature and slight indistinctness of the interstitial markings suggestive of mild pulmonary edema. Mild cardiomegaly. The patient is rotated to the left on today's exam, resulting in  distortion of the mediastinal contours and reduced diagnostic sensitivity and specificity for mediastinal pathology. Aortic atherosclerosis. IMPRESSION: 1. Mild interstitial pulmonary edema and small left pleural effusion in the setting of mild cardiomegaly; imaging findings concerning for congestive heart failure. 2. Aortic atherosclerosis. 3. Postoperative changes and support apparatus, as above. Electronically Signed   By: Vinnie Langton M.D.   On: 07/07/2018 11:54   Ct Head Wo Contrast  Result Date: 07/11/2018 CLINICAL DATA:  Initial evaluation for acute altered mental status, asymmetric left-sided weakness  with left facial droop. EXAM: CT HEAD WITHOUT CONTRAST TECHNIQUE: Contiguous axial images were obtained from the base of the skull through the vertex without intravenous contrast. COMPARISON:  Prior CT from 07/06/2018. FINDINGS: Brain: Age advanced cerebral atrophy with chronic microvascular ischemic disease, stable. Remote right MCA and/or MCA/PCA watershed territory infarct involving the right temporal occipital region noted, stable. Multiple chronic lacunar infarcts seen involving the bilateral basal ganglia, thalami, pons, and cerebellum. Specifically, previously noted right basal ganglia lacunar infarcts are stable from previous exam, likely chronic. No acute intracranial hemorrhage. No new or evolving acute large vessel territory infarct. No mass lesion, midline shift or mass effect. No hydrocephalus. No extra-axial fluid collection. Vascular: No hyperdense vessel. Calcified atherosclerosis at the skull base. Skull: Scalp soft tissues and calvarium within normal limits. Sinuses/Orbits: Globes and orbital soft tissues within normal limits. Scattered mucosal thickening with superimposed air-fluid levels noted within the paranasal sinuses, worsened from previous. Mastoid air cells are clear. Other: None. IMPRESSION: 1. Stable head CT, with no new acute intracranial abnormality. 2. In band cerebral  atrophy with chronic small vessel ischemic disease with multiple chronic ischemic infarcts as above. 3. Acute pan sinusitis, worsened from previous. Electronically Signed   By: Jeannine Boga M.D.   On: 07/11/2018 17:20   Ct Head Wo Contrast  Result Date: 07/06/2018 CLINICAL DATA:  Altered mental status. History of HIV, hypertension, hypercholesterolemia and diabetes. EXAM: CT HEAD WITHOUT CONTRAST TECHNIQUE: Contiguous axial images were obtained from the base of the skull through the vertex without intravenous contrast. COMPARISON:  CT HEAD July 21, 2016 FINDINGS: BRAIN: No intraparenchymal hemorrhage, mass effect, midline shift or acute large vascular territory infarcts. Old small RIGHT cerebellar and pontine infarcts. RIGHT temporal occipital encephalomalacia. New subcentimeter RIGHT internal capsule versus basal ganglia hypodensities. Old RIGHT basal ganglia infarct with mild ex vacuo dilatation RIGHT lateral ventricle. Old small bilateral thalami and LEFT basal ganglia infarcts. Confluent supratentorial white matter hypodensities. Moderate parenchymal brain volume loss. No hydrocephalus. VASCULAR: Moderate calcific atherosclerosis of the carotid siphons. SKULL: No skull fracture. No significant scalp soft tissue swelling. SINUSES/ORBITS: Mild paranasal sinus mucosal thickening. Chronic dehiscence of the RIGHT posterolateral maxillary wall. Mastoid air cells are well aerated.The included ocular globes and orbital contents are non-suspicious. OTHER: Bilateral parotid sialoliths. IMPRESSION: 1. New age indeterminate RIGHT basal ganglia small infarcts. 2. Old RIGHT temporal occipital/PCA territory infarct. 3. Old basal ganglia, thalami, pontine and cerebellar small infarcts. Moderate to severe chronic small vessel ischemic changes. 4. Moderate parenchymal brain volume loss, advanced for age. Electronically Signed   By: Elon Alas M.D.   On: 07/06/2018 22:16   US Renal  Result Date:  07/07/2018 CLINICAL DATA:  Acute kidney injury, history cardiomyopathy, chronic systolic heart failure, stage III chronic kidney disease, hypertension, COPD, former smoker EXAM: RENAL / URINARY TRACT ULTRASOUND COMPLETE COMPARISON:  09/05/2016 FINDINGS: Right Kidney: Renal measurements: 10.9.7 x 6.1 cm = volume: 200 mL. Cortical thinning. Increased cortical echogenicity. Tiny mid renal cyst 8 x 6.9 mm. Additional peripelvic cyst 3.0 x 3.2 x 3.1 cm, simple features. No additional mass or hydronephrosis. No shadowing calculi. Left Kidney: Renal measurements: 12.3 x 6.6 x 6.6 cm = volume: 276 mL. Cortical thinning. Increased cortical echogenicity. Small cyst at inferior pole 1.0 x 0.9 x 1.2 cm. Additional small cyst at inferior pole 2.0 x 1.6 x 1.9 cm, simple features. No additional mass, hydronephrosis, or shadowing calcification. Bladder: Decompressed, unable to evaluate. IMPRESSION: Medical renal disease changes of both kidneys. Small renal cysts  in both kidneys. No evidence of hydronephrosis. Electronically Signed   By: Lavonia Dana M.D.   On: 07/07/2018 19:30   Ir Fluoro Guide Cv Line Right  Result Date: 07/09/2018 INDICATION: Acute uremia, acute kidney injury, no current access for dialysis EXAM: Ultrasound guidance for vascular access Right IJ temporary dialysis catheter (Mahurkar catheter) MEDICATIONS: 1% lidocaine local ANESTHESIA/SEDATION: Moderate Sedation Time: None. The patient's level of consciousness and vital signs were monitored continuously by radiology nursing throughout the procedure under my direct supervision. FLUOROSCOPY TIME:  Fluoroscopy Time: 0 minutes 24 seconds (2 mGy). COMPLICATIONS: None immediate. PROCEDURE: Emergency consent was obtained from the patient's physician after a thorough discussion of the procedural risks, benefits and alternatives. All questions were addressed. Maximal Sterile Barrier Technique was utilized including caps, mask, sterile gowns, sterile gloves, sterile  drape, hand hygiene and skin antiseptic. A timeout was performed prior to the initiation of the procedure. Under sterile conditions and local anesthesia, ultrasound micropuncture access performed of the right internal jugular vein. Images obtained for documentation of the patent jugular vein. Guidewire advanced easily. Measurements obtained. Four [place] dilator advanced. Amplatz guidewire advanced into the IVC. Tract dilatation performed to insert a temporary dialysis catheter. Tip position at the SVC RA junction. Images obtained for documentation. Catheter secured Prolene sutures. Blood aspirated easily followed by saline and heparin flushes. Appropriate volume and strength of heparin instilled in all lumens. External caps applied. Catheter secured with Prolene sutures and a sterile dressing. No immediate complication. Patient tolerated the procedure well. IMPRESSION: Successful ultrasound and fluoroscopic right IJ temporary dialysis catheter. Tip SVC RA junction. Ready for use. Electronically Signed   By: Jerilynn Mages.  Shick M.D.   On: 07/09/2018 13:06   Ir US Guide Vasc Access Right  Result Date: 07/09/2018 INDICATION: Acute uremia, acute kidney injury, no current access for dialysis EXAM: Ultrasound guidance for vascular access Right IJ temporary dialysis catheter (Mahurkar catheter) MEDICATIONS: 1% lidocaine local ANESTHESIA/SEDATION: Moderate Sedation Time: None. The patient's level of consciousness and vital signs were monitored continuously by radiology nursing throughout the procedure under my direct supervision. FLUOROSCOPY TIME:  Fluoroscopy Time: 0 minutes 24 seconds (2 mGy). COMPLICATIONS: None immediate. PROCEDURE: Emergency consent was obtained from the patient's physician after a thorough discussion of the procedural risks, benefits and alternatives. All questions were addressed. Maximal Sterile Barrier Technique was utilized including caps, mask, sterile gowns, sterile gloves, sterile drape, hand hygiene  and skin antiseptic. A timeout was performed prior to the initiation of the procedure. Under sterile conditions and local anesthesia, ultrasound micropuncture access performed of the right internal jugular vein. Images obtained for documentation of the patent jugular vein. Guidewire advanced easily. Measurements obtained. Four [place] dilator advanced. Amplatz guidewire advanced into the IVC. Tract dilatation performed to insert a temporary dialysis catheter. Tip position at the SVC RA junction. Images obtained for documentation. Catheter secured Prolene sutures. Blood aspirated easily followed by saline and heparin flushes. Appropriate volume and strength of heparin instilled in all lumens. External caps applied. Catheter secured with Prolene sutures and a sterile dressing. No immediate complication. Patient tolerated the procedure well. IMPRESSION: Successful ultrasound and fluoroscopic right IJ temporary dialysis catheter. Tip SVC RA junction. Ready for use. Electronically Signed   By: Jerilynn Mages.  Shick M.D.   On: 07/09/2018 13:06   Dg Chest Port 1 View  Result Date: 07/20/2018 CLINICAL DATA:  Placement of right IJ dialysis catheter. EXAM: PORTABLE CHEST 1 VIEW COMPARISON:  07/12/2010 FINDINGS: The prior IJ catheter was removed. There is a new  double lumen dialysis catheter in place. The distal tip is in the region of the right atrium and the proximal tip is likely near the cavoatrial junction. No complicating features. The right ventricular pacer wire is stable. Stable surgical changes from bypass surgery. The heart remains enlarged. There is tortuosity and calcification of the thoracic aorta. Mild vascular congestion without overt pulmonary edema. No pleural effusions or pneumothorax. IMPRESSION: Right IJ dialysis catheter in good position without complicating features. Cardiac enlargement and vascular congestion without overt pulmonary edema. Electronically Signed   By: Marijo Sanes M.D.   On: 07/20/2018 10:53    Dg Chest Port 1 View  Result Date: 07/12/2018 CLINICAL DATA:  Wheezing EXAM: PORTABLE CHEST 1 VIEW COMPARISON:  07/11/2018 FINDINGS: Right IJ approach hemodialysis catheter tip is in the right atrium. Unchanged position of left chest wall single lead AICD. Mild interstitial pulmonary edema. Mild cardiomegaly with findings of prior CABG. Trace left pleural effusion. IMPRESSION: Mild cardiomegaly and mild interstitial pulmonary edema. Electronically Signed   By: Ulyses Jarred M.D.   On: 07/12/2018 04:32   Dg Chest Port 1 View  Result Date: 07/11/2018 CLINICAL DATA:  Shortness of breath EXAM: PORTABLE CHEST 1 VIEW COMPARISON:  07/07/2018 FINDINGS: Left chest wall AICD lead is in unchanged position. There is mild cardiomegaly. Central pulmonary vascular congestion without overt pulmonary edema. No focal airspace consolidation. Small left pleural effusion. No pneumothorax. IMPRESSION: Cardiomegaly and small left pleural effusion without overt pulmonary edema. Electronically Signed   By: Ulyses Jarred M.D.   On: 07/11/2018 03:35   Dg Chest Port 1 View  Result Date: 07/06/2018 CLINICAL DATA:  Sepsis EXAM: PORTABLE CHEST 1 VIEW COMPARISON:  Portable exam 2023 hours compared to 12/09/2016 FINDINGS: LEFT subclavian AICD with lead projecting over RIGHT ventricle. External pacing lead present. Enlargement of cardiac silhouette with postsurgical changes of CABG. Atherosclerotic calcification aorta. Mediastinal contours normal. Subsegmental atelectasis LEFT lower lobe. Pulmonary vascular congestion with interstitial prominence at the mid to lower lungs which could represent minimal pulmonary edema. No pleural effusion or pneumothorax. Bones demineralized. IMPRESSION: Enlargement of cardiac silhouette with vascular congestion and question minimal pulmonary edema. Electronically Signed   By: Lavonia Dana M.D.   On: 07/06/2018 20:35   Dg Swallowing Func-speech Pathology  Result Date: 07/14/2018 Objective Swallowing  Evaluation: Type of Study: MBS-Modified Barium Swallow Study  Patient Details Name: [name] MRN: [id number] Date of Birth: 1951-04-28 Today's Date: 07/14/2018 Time: SLP Start Time (ACUTE ONLY): 19 -SLP Stop Time (ACUTE ONLY): 1150 SLP Time Calculation (min) (ACUTE ONLY): 20 min Past Medical History: Past Medical History: Diagnosis Date . AICD (automatic cardioverter/defibrillator) present  . Alcoholism in remission ([place])  . Anemia  . Aortic insufficiency  . Arthritis  . At moderate risk for fall  . Bell's palsy  . Chronic systolic heart failure (HCC)   NYHA Class III . CKD (chronic kidney disease), stage IV ([place])  . COPD (chronic obstructive pulmonary disease) ([place])  . Essential hypertension, benign  . HIV disease ([place]) 09/06/2016 . Hyperlipidemia  . NICM (nonischemic cardiomyopathy) ([place])  . Noncompliance with medication regimen  . NSVT (nonsustained ventricular tachycardia) ([place])  . Numbness of right jaw   Had a stoke in 01/2013. Numbness is occasional, especially when trying to chew. . OSA (obstructive sleep apnea)  . Productive cough 08/2013  With brown sputum  . S/P aortic valve replacement with bioprosthetic valve  . Stroke ([place]) 01/2013  weakness of right side from CVA . Type 2 diabetes mellitus (  [place])  . Uses hearing aid 2014  recently received new hearing aids Past Surgical History: Past Surgical History: Procedure Laterality Date . BIOPSY N/A 04/10/2014  Procedure: GASTRIC BIOPSY;  Surgeon: Daneil Dolin, MD;  Location: [facility];  Service: Endoscopy;  Laterality: N/A; . BIOPSY  10/12/2015  Procedure: BIOPSY;  Surgeon: Daneil Dolin, MD;  Location: [facility];  Service: Endoscopy;;  stomach bx's . CARDIAC DEFIBRILLATOR PLACEMENT  11/12/12  [employer] Inogen MINI ICD implanted in [name] at [name] per Dr Nelly Laurence note . COLONOSCOPY WITH PROPOFOL N/A 04/10/2014  RMR: Colonic Diverticulosis . ESOPHAGOGASTRODUODENOSCOPY (EGD) WITH PROPOFOL N/A  04/10/2014  RMR: Mild erosive reflux esophagitis. Multiple antral polyps likely hyperplastic status post removal by hot snare cautery technique. Diffusely abnormal stomach status post gastric biopsy I suspect some of patients anemaia may be due to intermittent oozing from the stomach. It would be difficult and a risky proposition to attempt complete removal of all of his gastric polyps. . ESOPHAGOGASTRODUODENOSCOPY (EGD) WITH PROPOFOL N/A 10/12/2015  Procedure: ESOPHAGOGASTRODUODENOSCOPY (EGD) WITH PROPOFOL;  Surgeon: Daneil Dolin, MD;  Location: [facility];  Service: Endoscopy;  Laterality: N/A;  6945 - moved to 9:15 . IR FLUORO GUIDE CV LINE RIGHT  07/09/2018 . IR US GUIDE VASC ACCESS RIGHT  07/09/2018 . POLYPECTOMY N/A 04/10/2014  Procedure: GASTRIC POLYPECTOMY;  Surgeon: Daneil Dolin, MD;  Location: [facility];  Service: Endoscopy;  Laterality: N/A; . RIGHT HEART CATHETERIZATION N/A 02/06/2014  Procedure: RIGHT HEART CATH;  Surgeon: Larey Dresser, MD;  Location: [name] CATH LAB;  Service: Cardiovascular;  Laterality: N/A; HPI: [name] is a 68 y.o. male with PMH significant of HIV, type 2 diabetes, COPD, CVA (03888, HTN, Bells Palsy, systolic CHF, CKD, and history of AICD placement followed by cardiology.  Pt was admitted to [name] 07/07/18 and after presenting with AMS at SNF residence, vomitted, and became increasingly unresponsive. Per PA note, pt was found to be encephalopathic secondary to sepsis and renal failure. Pt seen by ST in Jan 2018; BSE recc D2/thin. CXR showed cardiomegaly and small left pleural effusion. Head CT revealed new age indeterminate RIGHT basal ganglia small infarcts, old RIGHT temporal occipital/PCA territory infarct, old basal ganglia, thalami, pontine and cerebellar small infarcts.  Subjective: alert, cooperative Assessment / Plan / Recommendation CHL IP CLINICAL IMPRESSIONS 07/14/2018 Clinical Impression Pt presents with primary moderate oral dysphagia, mild pharyngeal dysphagia.  There is lingual pumping, decreased bolus cohesion, piecemeal deglutition and premature spillage (to pyriforms with liquids, valleculae with solids). Premature spillage results in intermittent deep larygneal penetration of thin, nectar, and honey thick liquids, with aspiration (sensed and silent) of thin and nectar. Due to impulsivity pt at risk for aspiration with liquids of any consistency. With solids, pt without rotary chew, lingual thrusting noted with poor cohesion and spillage of solids to the valleculae. SLP used straw to facilitate chin tuck, which prevented aspiration with thin liquids, including small and large sips and consecutive boluses. There was trace shallow penetration with chin tuck which reversed with swallow. Brief stasis of barium tablet in mid-thoracic esophagus, cleared pt's GE junction with liquid wash. Recommend dys 2, thin liquids, meds whole in puree; pt must tuck chin when swallowing for airway protection. Recommend full supervision due to level of cuing necessary. SLP will follow for tolerance and training in swallow precautions/compensations.  SLP Visit Diagnosis Dysphagia, oral phase (R13.11);Dysphagia, pharyngeal phase (R13.13) Attention and concentration deficit following --  Frontal lobe and executive function deficit following -- Impact on safety and function Moderate aspiration risk   CHL IP TREATMENT RECOMMENDATION 07/14/2018 Treatment Recommendations Therapy as outlined in treatment plan below   Prognosis 07/14/2018 Prognosis for Safe Diet Advancement Good Barriers to Reach Goals Cognitive deficits Barriers/Prognosis Comment -- CHL IP DIET RECOMMENDATION 07/14/2018 SLP Diet Recommendations Dysphagia 2 (Fine chop) solids;Thin liquid Liquid Administration via Straw Medication Administration Whole meds with puree Compensations Slow rate;Small sips/bites;Chin tuck;Clear throat intermittently;Use straw to facilitate chin tuck Postural Changes --   CHL IP OTHER RECOMMENDATIONS 07/14/2018  Recommended Consults -- Oral Care Recommendations Oral care BID Other Recommendations --   CHL IP FOLLOW UP RECOMMENDATIONS 07/14/2018 Follow up Recommendations Skilled Nursing facility   [name] IP FREQUENCY AND DURATION 07/14/2018 Speech Therapy Frequency (ACUTE ONLY) min 2x/week Treatment Duration 2 weeks      CHL IP ORAL PHASE 07/14/2018 Oral Phase Impaired Oral - Pudding Teaspoon -- Oral - Pudding Cup -- Oral - Honey Teaspoon NT Oral - Honey Cup Weak lingual manipulation;Lingual pumping;Reduced posterior propulsion;Piecemeal swallowing;Decreased bolus cohesion;Premature spillage Oral - Nectar Teaspoon Weak lingual manipulation;Lingual pumping;Reduced posterior propulsion;Piecemeal swallowing;Decreased bolus cohesion;Premature spillage Oral - Nectar Cup Weak lingual manipulation;Lingual pumping;Reduced posterior propulsion;Piecemeal swallowing;Decreased bolus cohesion;Premature spillage Oral - Nectar Straw Weak lingual manipulation;Lingual pumping;Reduced posterior propulsion;Piecemeal swallowing;Delayed oral transit;Decreased bolus cohesion Oral - Thin Teaspoon Lingual pumping;Reduced posterior propulsion;Piecemeal swallowing;Decreased bolus cohesion;Premature spillage Oral - Thin Cup Weak lingual manipulation;Lingual pumping;Reduced posterior propulsion;Piecemeal swallowing;Decreased bolus cohesion;Premature spillage Oral - Thin Straw Weak lingual manipulation;Reduced posterior propulsion;Piecemeal swallowing;Decreased bolus cohesion;Premature spillage Oral - Puree Weak lingual manipulation;Lingual pumping;Reduced posterior propulsion;Piecemeal swallowing;Decreased bolus cohesion;Premature spillage Oral - Mech Soft Impaired mastication;Weak lingual manipulation;Lingual pumping;Reduced posterior propulsion;Lingual/palatal residue;Piecemeal swallowing;Delayed oral transit;Premature spillage;Decreased bolus cohesion Oral - Regular -- Oral - Multi-Consistency -- Oral - Pill Other (Comment) Oral Phase - Comment --  CHL IP  PHARYNGEAL PHASE 07/14/2018 Pharyngeal Phase Impaired Pharyngeal- Pudding Teaspoon -- Pharyngeal -- Pharyngeal- Pudding Cup -- Pharyngeal -- Pharyngeal- Honey Teaspoon -- Pharyngeal -- Pharyngeal- Honey Cup Penetration/Aspiration before swallow;Penetration/Aspiration during swallow Pharyngeal Material enters airway, remains ABOVE vocal cords then ejected out;Material enters airway, remains ABOVE vocal cords and not ejected out Pharyngeal- Nectar Teaspoon -- Pharyngeal -- Pharyngeal- Nectar Cup Penetration/Aspiration before swallow;Penetration/Aspiration during swallow;Trace aspiration Pharyngeal Material does not enter airway;Material enters airway, CONTACTS cords and not ejected out;Material enters airway, passes BELOW cords without attempt by patient to eject out (silent aspiration) Pharyngeal- Nectar Straw Penetration/Aspiration before swallow;Penetration/Aspiration during swallow;Trace aspiration Pharyngeal Material enters airway, CONTACTS cords and not ejected out;Material enters airway, passes BELOW cords without attempt by patient to eject out (silent aspiration) Pharyngeal- Thin Teaspoon Penetration/Aspiration before swallow;Penetration/Aspiration during swallow;Moderate aspiration Pharyngeal Material enters airway, passes BELOW cords and not ejected out despite cough attempt by patient Pharyngeal- Thin Cup Penetration/Aspiration before swallow;Penetration/Aspiration during swallow;Moderate aspiration Pharyngeal Material does not enter airway;Material enters airway, CONTACTS cords and not ejected out;Material enters airway, passes BELOW cords and not ejected out despite cough attempt by patient;Material enters airway, passes BELOW cords without attempt by patient to eject out (silent aspiration) Pharyngeal- Thin Straw Penetration/Aspiration during swallow;Other (Comment) Pharyngeal Material does not enter airway;Material enters airway, remains ABOVE vocal cords then ejected out Pharyngeal- Puree WFL  Pharyngeal -- Pharyngeal- Mechanical Soft WFL Pharyngeal -- Pharyngeal- Regular -- Pharyngeal -- Pharyngeal- Multi-consistency -- Pharyngeal -- Pharyngeal- Pill Other (Comment) Pharyngeal -- Pharyngeal Comment --  CHL IP CERVICAL ESOPHAGEAL PHASE 07/14/2018 Cervical Esophageal Phase WFL Pudding Teaspoon -- Pudding Cup -- Honey Teaspoon -- Honey Cup --  Nectar Teaspoon -- Nectar Cup -- Nectar Straw -- Thin Teaspoon -- Thin Cup -- Thin Straw -- Puree -- Mechanical Soft -- Regular -- Multi-consistency -- Pill -- Cervical Esophageal Comment -- Deneise Lever, MS, [place] Pager: [phone] Office: [phone] Aliene Altes 07/14/2018, 1:46 PM              Dg Cyndy Freeze Guide Cv Line-no Report  Result Date: 07/20/2018 Fluoroscopy was utilized by the requesting physician.  No radiographic interpretation.   Vas [place] Upper Ext Vein Mapping (pre-op Avf)  Result Date: 07/18/2018 UPPER EXTREMITY VEIN MAPPING  Indications: Pre-access. Performing Technologist: Abram Sander RVS  Examination Guidelines: A complete evaluation includes B-mode imaging, spectral Doppler, color Doppler, and power Doppler as needed of all accessible portions of each vessel. Bilateral testing is considered an integral part of a complete examination. Limited examinations for reoccurring indications may be performed as noted. +-----------------+-------------+----------+--------------+ Right Cephalic   Diameter (cm)Depth (cm)   Findings    +-----------------+-------------+----------+--------------+ Shoulder             0.50        1.23                  +-----------------+-------------+----------+--------------+ Prox upper arm       0.55        0.84                  +-----------------+-------------+----------+--------------+ Mid upper arm        0.53        0.27                  +-----------------+-------------+----------+--------------+ Dist upper arm       0.41        0.41                   +-----------------+-------------+----------+--------------+ Antecubital fossa    0.50        0.29                  +-----------------+-------------+----------+--------------+ Prox forearm         0.24        0.22     branching    +-----------------+-------------+----------+--------------+ Mid forearm                             not visualized +-----------------+-------------+----------+--------------+ Dist forearm                            not visualized +-----------------+-------------+----------+--------------+ +-----------------+-------------+----------+--------+ Right Basilic    Diameter (cm)Depth (cm)Findings +-----------------+-------------+----------+--------+ Prox upper arm       0.36        1.36            +-----------------+-------------+----------+--------+ Mid upper arm        0.44        1.66            +-----------------+-------------+----------+--------+ Dist upper arm       0.56        0.95            +-----------------+-------------+----------+--------+ Antecubital fossa    0.64        0.50            +-----------------+-------------+----------+--------+ Prox forearm         0.29        0.21            +-----------------+-------------+----------+--------+  Mid forearm          0.32        0.20            +-----------------+-------------+----------+--------+ Distal forearm       0.30        0.20            +-----------------+-------------+----------+--------+ +-----------------+-------------+----------+---------+ Left Cephalic    Diameter (cm)Depth (cm)Findings  +-----------------+-------------+----------+---------+ Shoulder             0.40        0.98             +-----------------+-------------+----------+---------+ Prox upper arm       0.43        0.62             +-----------------+-------------+----------+---------+ Mid upper arm        0.41        0.36              +-----------------+-------------+----------+---------+ Dist upper arm       0.39        0.37             +-----------------+-------------+----------+---------+ Antecubital fossa    0.50        0.34   branching +-----------------+-------------+----------+---------+ Prox forearm         0.31        0.18             +-----------------+-------------+----------+---------+ Mid forearm          0.29        0.19             +-----------------+-------------+----------+---------+ Dist forearm         0.29        0.17             +-----------------+-------------+----------+---------+ +-----------------+-------------+----------+--------------+ Left Basilic     Diameter (cm)Depth (cm)   Findings    +-----------------+-------------+----------+--------------+ Prox upper arm       0.70        1.48                  +-----------------+-------------+----------+--------------+ Mid upper arm        0.57        1.30     branching    +-----------------+-------------+----------+--------------+ Dist upper arm                          not visualized +-----------------+-------------+----------+--------------+ Antecubital fossa                       not visualized +-----------------+-------------+----------+--------------+ Prox forearm                            not visualized +-----------------+-------------+----------+--------------+ Mid forearm                             not visualized +-----------------+-------------+----------+--------------+ Distal forearm                          not visualized +-----------------+-------------+----------+--------------+ *See table(s) above for measurements and observations.  Diagnosing physician: Servando Snare MD Electronically signed by Servando Snare MD on 07/18/2018 at 5:32:40 PM.    Final    Dg Fluoro Guide Lumbar Puncture  Result Date: 07/10/2018 CLINICAL DATA:  68 year old male with encephalopathy. Subsequent encounter.  EXAM:  DIAGNOSTIC LUMBAR PUNCTURE UNDER FLUOROSCOPIC GUIDANCE FLUOROSCOPY TIME:  Fluoroscopy Time:  6 seconds PROCEDURE: Patient was not able to give consent. Family (sister) not available by phone. Emergency request/consent from Dr. Candiss Norse. Labs and head CT reviewed. Under fluoroscopic guidance and aseptic technique (utilizing 1% lidocaine as local anesthetic), L3-4 lumbar puncture was performed with a single pass of a 22 gauge spinal needle. Opening pressure 26 and closing pressure 20 cc water (not felt to be accurate as patient was lying on the stomach and moving throughout the exam). 8.5 cc of clear cerebral spinal fluid collected and sent for labs. Patient tolerated procedure well without any difficulty. IMPRESSION: Successful L3-4 lumbar puncture as noted above. Electronically Signed   By: Genia Del M.D.   On: 07/10/2018 15:12     PERTINENT LAB RESULTS: CBC: Recent Labs    07/23/18 1708  WBC 10.4  HGB 12.0*  HCT 37.4*  PLT 121*   CMET CMP     Component Value Date/Time   NA 137 07/23/2018 1600   K 4.1 07/23/2018 1600   CL 103 07/23/2018 1600   CO2 22 07/23/2018 1600   GLUCOSE 160 (H) 07/23/2018 1600   BUN 83 (H) 07/23/2018 1600   CREATININE 7.66 (H) 07/23/2018 1600   CREATININE 2.33 (H) 07/09/2014 1145   CALCIUM 6.6 (L) 07/23/2018 1600   CALCIUM 10.3 (H) 02/14/2017 0800   PROT 5.5 (L) 07/16/2018 0430   ALBUMIN 2.0 (L) 07/23/2018 1600   AST 31 07/16/2018 0430   ALT 36 07/16/2018 0430   ALKPHOS 62 07/16/2018 0430   BILITOT 0.5 07/16/2018 0430   GFRNONAA 7 (L) 07/23/2018 1600   GFRAA 8 (L) 07/23/2018 1600    GFR Estimated Creatinine Clearance: 8.6 mL/min (A) (by C-G formula based on SCr of 7.66 mg/dL (H)). No results for input(s): LIPASE, AMYLASE in the last 72 hours. No results for input(s): CKTOTAL, CKMB, CKMBINDEX, TROPONINI in the last 72 hours. Invalid input(s): POCBNP No results for input(s): DDIMER in the last 72 hours. No results for input(s): HGBA1C in the last 72  hours. No results for input(s): CHOL, HDL, LDLCALC, TRIG, CHOLHDL, LDLDIRECT in the last 72 hours. No results for input(s): TSH, T4TOTAL, T3FREE, THYROIDAB in the last 72 hours.  Invalid input(s): FREET3 No results for input(s): VITAMINB12, FOLATE, FERRITIN, TIBC, IRON, RETICCTPCT in the last 72 hours. Coags: Recent Labs    07/25/18 0440 07/26/18 0423  INR 2.1* 2.1*   Microbiology: No results found for this or any previous visit (from the past 240 hour(s)).  FURTHER DISCHARGE INSTRUCTIONS:  Get Medicines reviewed and adjusted: Please take all your medications with you for your next visit with your Primary MD  Laboratory/radiological data: Please request your Primary MD to go over all [name] tests and procedure/radiological results at the follow up, please ask your Primary MD to get all [name] records sent to his/her office.  In some cases, they will be blood work, cultures and biopsy results pending at the time of your discharge. Please request that your primary care M.D. goes through all the records of your [name] data and follows up on these results.  Also Note the following: If you experience worsening of your admission symptoms, develop shortness of breath, life threatening emergency, suicidal or homicidal thoughts you must seek medical attention immediately by calling 911 or calling your MD immediately  if symptoms less severe.  You must read complete instructions/literature along with all the possible adverse reactions/side effects for all the Medicines you  take and that have been prescribed to you. Take any new Medicines after you have completely understood and accpet all the possible adverse reactions/side effects.   Do not drive when taking Pain medications or sleeping medications (Benzodaizepines)  Do not take more than prescribed Pain, Sleep and Anxiety Medications. It is not advisable to combine anxiety,sleep and pain medications without talking with your primary  care practitioner  Special Instructions: If you have smoked or chewed Tobacco  in the last 2 yrs please stop smoking, stop any regular Alcohol  and or any Recreational drug use.  Wear Seat belts while driving.  Please note: You were cared for by a hospitalist during your [name] stay. Once you are discharged, your primary care physician will handle any further medical issues. Please note that NO REFILLS for any discharge medications will be authorized once you are discharged, as it is imperative that you return to your primary care physician (or establish a relationship with a primary care physician if you do not have one) for your post [name] discharge needs so that they can reassess your need for medications and monitor your lab values.  Total Time spent coordinating discharge including counseling, education and face to face time equals 35 minutes.  SignedOren Binet 07/26/2018 9:59 AM

## 2018-07-26 NOTE — Progress Notes (Signed)
Patient ID: Keith [name], male   DOB: May 28, 1951, 68 y.o.   MRN: [id number] S: No new complaints.  Pt has been accepted to [facility] center  O:BP 110/85 (BP Location: Right Arm)   Pulse 83   Temp 98.8 F (37.1 C)   Resp 16   Ht 5\' 3"  (1.6 m)   Wt 74.9 kg   SpO2 98%   BMI 29.25 kg/m   Intake/Output Summary (Last 24 hours) at 07/26/2018 1244 Last data filed at 07/26/2018 1018 Gross per 24 hour  Intake 715 ml  Output 2000 ml  Net -1285 ml   Intake/Output: I/O last 3 completed shifts: In: 297 [P.O.:713] Out: 2200 [Urine:200; Other:2000]  Intake/Output this shift:  Total I/O In: 120 [P.O.:120] Out: -  Weight change: -4.4 kg Gen: NAD CVS: no rub Resp: cta Abd: benign Ext: no edema, L forearm AVG +T/B, RIJ tdc  Recent Labs  Lab 07/20/18 0400 07/21/18 0426 07/22/18 0523 07/23/18 1600  NA 138 138 137 137  K 4.0 3.9 3.9 4.1  CL 104 101 102 103  CO2 24 27 24 22   GLUCOSE 221* 90 146* 160*  BUN 65* 33* 57* 83*  CREATININE 4.95* 3.91* 5.88* 7.66*  ALBUMIN  --  2.1* 2.0* 2.0*  CALCIUM 7.0* 7.0* 6.8* 6.6*  PHOS  --  3.4 4.3 4.3   Liver Function Tests: Recent Labs  Lab 07/21/18 0426 07/22/18 0523 07/23/18 1600  ALBUMIN 2.1* 2.0* 2.0*   No results for input(s): LIPASE, AMYLASE in the last 168 hours. No results for input(s): AMMONIA in the last 168 hours. CBC: Recent Labs  Lab 07/20/18 0400 07/21/18 0426 07/23/18 1708  WBC 5.2 5.1 10.4  HGB 11.7* 12.4* 12.0*  HCT 35.8* 39.1 37.4*  MCV 90.2 90.7 90.1  PLT 156 117* 121*   Cardiac Enzymes: No results for input(s): CKTOTAL, CKMB, CKMBINDEX, TROPONINI in the last 168 hours. CBG: Recent Labs  Lab 07/25/18 0954 07/25/18 1704 07/25/18 2122 07/26/18 0813 07/26/18 1136  GLUCAP 224* 184* 351* 83 170*    Iron Studies: No results for input(s): IRON, TIBC, TRANSFERRIN, FERRITIN in the last 72 hours. Studies/Results: No results found. . bictegravir-emtricitabine-tenofovir AF  1 tablet Oral Daily  . Chlorhexidine  Gluconate Cloth  6 each Topical Q0600  . feeding supplement (ENSURE ENLIVE)  237 mL Oral BID BM  . insulin aspart  0-9 Units Subcutaneous TID WC  . mouth rinse  15 mL Mouth Rinse BID  . metoprolol succinate  25 mg Oral Daily  . mometasone-formoterol  2 puff Inhalation BID  . multivitamin  1 tablet Oral QHS  . predniSONE  40 mg Oral Q breakfast  . warfarin  5 mg Oral q1800  . warfarin   Does not apply Once  . Warfarin - Pharmacist Dosing Inpatient   Does not apply q1800    BMET    Component Value Date/Time   NA 137 07/23/2018 1600   K 4.1 07/23/2018 1600   CL 103 07/23/2018 1600   CO2 22 07/23/2018 1600   GLUCOSE 160 (H) 07/23/2018 1600   BUN 83 (H) 07/23/2018 1600   CREATININE 7.66 (H) 07/23/2018 1600   CREATININE 2.33 (H) 07/09/2014 1145   CALCIUM 6.6 (L) 07/23/2018 1600   CALCIUM 10.3 (H) 02/14/2017 0800   GFRNONAA 7 (L) 07/23/2018 1600   GFRAA 8 (L) 07/23/2018 1600   CBC    Component Value Date/Time   WBC 10.4 07/23/2018 1708   RBC 4.15 (L) 07/23/2018 1708   HGB  12.0 (L) 07/23/2018 1708   HCT 37.4 (L) 07/23/2018 1708   PLT 121 (L) 07/23/2018 1708   MCV 90.1 07/23/2018 1708   MCH 28.9 07/23/2018 1708   MCHC 32.1 07/23/2018 1708   RDW 14.6 07/23/2018 1708   LYMPHSABS 0.7 07/18/2018 0341   MONOABS 0.5 07/18/2018 0341   EOSABS 0.0 07/18/2018 0341   BASOSABS 0.0 07/18/2018 0341     Assessment/ Plan:  1. Sepsis due to E Coli Uti- per ID completed 2 week course. TEE negative, CT showed diverticuli, and blood cultures negative 07/09/18. 2. ESRD- initially with AKI/CKD stage 4 but has remained dialysis dependent since 07/09/18. No real signs of recovery and had advanced CKD prior to admission. [place] for discharge and to go to HD on Sat at [facility] (already had 2 HD sessions this week) 2. [facility] TTS. 3. Anemia:Hgb >12, no ESA 4. CKD-MBD:ca/phos stable. iPTH has been low in the past will recheck. 5. Nutrition:renal diet 6. L forearm avg-  placed 07/20/18, mildly edematousbut improving, +T/B.  No discharge 7. Hypertension:stable 8. Metabolic encephalopathy- presumably due to combination of sepsis and uremia. Slow mentation but alert and awake.  Slowly improving. 9. 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 Combined CHF cardiology following 11. A fib. 12. HIV on biktarvy 13. Severe Protein malnutrition- encourage protein intake 14. Disposition- cont with PT/OT for deconditioning and discharge to [facility] today.  Donetta Potts, MD [employer] [phone]

## 2018-07-26 NOTE — Progress Notes (Signed)
ANTICOAGULATION CONSULT NOTE - Follow Up Consult  Pharmacy Consult for Coumadin Indication: atrial fibrillation  No Known Allergies  Patient Measurements: Height: 5\' 3"  (160 cm) Weight: 165 lb 2 oz (74.9 kg) IBW/kg (Calculated) : 56.9  Vital Signs: Temp: 98.8 F (37.1 C) (02/27 0412) BP: 110/85 (02/27 0412) Pulse Rate: 83 (02/27 0412)  Labs: Recent Labs    07/23/18 1600 07/23/18 1708 07/24/18 1004 07/25/18 0440 07/26/18 0423  HGB  --  12.0*  --   --   --   HCT  --  37.4*  --   --   --   PLT  --  121*  --   --   --   LABPROT  --   --  22.2* 23.0* 23.4*  INR  --   --  2.0* 2.1* 2.1*  CREATININE 7.66*  --   --   --   --     Estimated Creatinine Clearance: 8.6 mL/min (A) (by C-G formula based on SCr of 7.66 mg/dL (H)).  Assessment: No anticoagulation PTA. s/p IV heparin for new onset AFib with RVR, CHA2DS2-Vasc score 5 >>stopped due to thrombocytopenia. Coumadin started 2/18.  INR therapeutic again at 2.1. Plan to dc today.  Goal of Therapy:  INR 2-3 Monitor platelets by anticoagulation protocol: Yes   Plan:  - Coumadin 5mg  PO qday - Daily INR - f/u patient education when/if encephalopathy improved  Onnie Boer, PharmD, BCIDP, AAHIVP, CPP Infectious Disease Pharmacist 07/26/2018 2:28 PM

## 2018-07-26 NOTE — Progress Notes (Signed)
[facility] able to accept patient today.   Percell Locus Edwena Mayorga LCSW [phone]

## 2018-07-26 NOTE — Discharge Instructions (Signed)
[facility]  Discharge Instructions  AV Fistula or Graft Surgery for Dialysis Access  Please refer to the following instructions for your post-procedure care. Your surgeon or physician assistant will discuss any changes with you.  Activity  You may drive the day following your surgery, if you are comfortable and no longer taking prescription pain medication. Resume full activity as the soreness in your incision resolves.  Bathing/Showering  You may shower after you go home. Keep your incision dry for 48 hours. Do not soak in a bathtub, hot tub, or swim until the incision heals completely. You may not shower if you have a hemodialysis catheter.  Incision Care  Clean your incision with mild soap and water after 48 hours. Pat the area dry with a clean towel. You do not need a bandage unless otherwise instructed. Do not apply any ointments or creams to your incision. You may have skin glue on your incision. Do not peel it off. It will come off on its own in about one week. Your arm may swell a bit after surgery. To reduce swelling use pillows to elevate your arm so it is above your heart. Your doctor will tell you if you need to lightly wrap your arm with an ACE bandage.  Diet  Resume your normal diet. There are not special food restrictions following this procedure. In order to heal from your surgery, it is CRITICAL to get adequate nutrition. Your body requires vitamins, minerals, and protein. Vegetables are the best source of vitamins and minerals. Vegetables also provide the perfect balance of protein. Processed food has little nutritional value, so try to avoid this.  Medications  Resume taking all of your medications. If your incision is causing pain, you may take over-the counter pain relievers such as acetaminophen (Tylenol). If you were prescribed a stronger pain medication, please be aware these medications can cause nausea and constipation. Prevent  nausea by taking the medication with a snack or meal. Avoid constipation by drinking plenty of fluids and eating foods with high amount of fiber, such as fruits, vegetables, and grains. Do not take Tylenol if you are taking prescription pain medications.     Follow up Your surgeon may want to see you in the office following your access surgery. If so, this will be arranged at the time of your surgery.  Please call us immediately for any of the following conditions:  Increased pain, redness, drainage (pus) from your incision site Fever of 101 degrees or higher Severe or worsening pain at your incision site Hand pain or numbness.  Reduce your risk of vascular disease:  Stop smoking. If you would like help, call [facility] at 1-800-QUIT-NOW 224 855 5104) or [place] at [place] your cholesterol Maintain a desired weight Control your diabetes Keep your blood pressure down  Dialysis  It will take several weeks to several months for your new dialysis access to be ready for use. Your surgeon will determine when it is OK to use it. Your nephrologist will continue to direct your dialysis. You can continue to use your Permcath until your new access is ready for use.  If you have any questions, please call the office at [phone]. Information on my medicine - Coumadin   (Warfarin)  This medication education was reviewed with me or my healthcare representative as part of my discharge preparation.  The pharmacist that spoke with me during my hospital stay was:  Onnie Boer, RPH-CPP  Why was Coumadin  prescribed for you? Coumadin was prescribed for you because you have a blood clot or a medical condition that can cause an increased risk of forming blood clots. Blood clots can cause serious health problems by blocking the flow of blood to the heart, lung, or brain. Coumadin can prevent harmful blood clots from forming. As a reminder your indication for Coumadin is:   Stroke  Prevention Because Of Atrial Fibrillation  What test will check on my response to Coumadin? While on Coumadin (warfarin) you will need to have an INR test regularly to ensure that your dose is keeping you in the desired range. The INR (international normalized ratio) number is calculated from the result of the laboratory test called prothrombin time (PT).  If an INR APPOINTMENT HAS NOT ALREADY BEEN MADE FOR YOU please schedule an appointment to have this lab work done by your health care provider within 7 days. Your INR goal is usually a number between:  2 to 3 or your provider may give you a more narrow range like 2-2.5.  Ask your health care provider during an office visit what your goal INR is.  What  do you need to  know  About  COUMADIN? Take Coumadin (warfarin) exactly as prescribed by your healthcare provider about the same time each day.  DO NOT stop taking without talking to the doctor who prescribed the medication.  Stopping without other blood clot prevention medication to take the place of Coumadin may increase your risk of developing a new clot or stroke.  Get refills before you run out.  What do you do if you miss a dose? If you miss a dose, take it as soon as you remember on the same day then continue your regularly scheduled regimen the next day.  Do not take two doses of Coumadin at the same time.  Important Safety Information A possible side effect of Coumadin (Warfarin) is an increased risk of bleeding. You should call your healthcare provider right away if you experience any of the following: ? Bleeding from an injury or your nose that does not stop. ? Unusual colored urine (red or dark brown) or unusual colored stools (red or black). ? Unusual bruising for unknown reasons. ? A serious fall or if you hit your head (even if there is no bleeding).  Some foods or medicines interact with Coumadin (warfarin) and might alter your response to warfarin. To help avoid this: ? Eat a  balanced diet, maintaining a consistent amount of Vitamin K. ? Notify your provider about major diet changes you plan to make. ? Avoid alcohol or limit your intake to 1 drink for women and 2 drinks for men per day. (1 drink is 5 oz. wine, 12 oz. beer, or 1.5 oz. liquor.)  Make sure that ANY health care provider who prescribes medication for you knows that you are taking Coumadin (warfarin).  Also make sure the healthcare provider who is monitoring your Coumadin knows when you have started a new medication including herbals and non-prescription products.  Coumadin (Warfarin)  Major Drug Interactions  Increased Warfarin Effect Decreased Warfarin Effect  Alcohol (large quantities) Antibiotics (esp. Septra/Bactrim, Flagyl, Cipro) Amiodarone (Cordarone) Aspirin (ASA) Cimetidine (Tagamet) Megestrol (Megace) NSAIDs (ibuprofen, naproxen, etc.) Piroxicam (Feldene) Propafenone (Rythmol SR) Propranolol (Inderal) Isoniazid (INH) Posaconazole (Noxafil) Barbiturates (Phenobarbital) Carbamazepine (Tegretol) Chlordiazepoxide (Librium) Cholestyramine (Questran) Griseofulvin Oral Contraceptives Rifampin Sucralfate (Carafate) Vitamin K   Coumadin (Warfarin) Major Herbal Interactions  Increased Warfarin Effect Decreased Warfarin Effect  Garlic Ginseng Ginkgo  biloba Coenzyme Q10 Green tea St. Johns wort    Coumadin (Warfarin) FOOD Interactions  Eat a consistent number of servings per week of foods HIGH in Vitamin K (1 serving =  cup)  Collards (cooked, or boiled & drained) Kale (cooked, or boiled & drained) Mustard greens (cooked, or boiled & drained) Parsley *serving size only =  cup Spinach (cooked, or boiled & drained) Swiss chard (cooked, or boiled & drained) Turnip greens (cooked, or boiled & drained)  Eat a consistent number of servings per week of foods MEDIUM-HIGH in Vitamin K (1 serving = 1 cup)  Asparagus (cooked, or boiled & drained) Broccoli (cooked, boiled & drained,  or raw & chopped) Brussel sprouts (cooked, or boiled & drained) *serving size only =  cup Lettuce, raw (green leaf, endive, romaine) Spinach, raw Turnip greens, raw & chopped   These websites have more information on Coumadin (warfarin):  FailFactory.se; VeganReport.com.au;

## 2018-07-26 NOTE — Progress Notes (Signed)
Patient will DC to: [facility] Anticipated DC date: 07/26/2018 Family notified: Brother Transport by: Corey Harold 11:30am   Per MD patient ready for DC to [facility]. RN, patient, patient's family, and facility notified of DC. Discharge Summary and FL2 sent to facility. RN to call report prior to discharge [phone]). DC packet on chart. Ambulance transport requested for patient.   CSW will sign off for now as social work intervention is no longer needed. Please consult [place] again if new needs arise.  Cedric Fishman, LCSW Clinical Social Worker [phone]

## 2018-07-27 ENCOUNTER — Encounter: Payer: Self-pay | Admitting: Adult Health

## 2018-07-27 ENCOUNTER — Non-Acute Institutional Stay (SKILLED_NURSING_FACILITY): Payer: Medicare HMO | Admitting: Adult Health

## 2018-07-27 ENCOUNTER — Encounter (HOSPITAL_COMMUNITY)
Admission: RE | Admit: 2018-07-27 | Discharge: 2018-07-27 | Disposition: A | Discharge status: Home / Self Care | Payer: Medicare HMO | Source: Skilled Nursing Facility | Attending: Internal Medicine | Admitting: Internal Medicine

## 2018-07-27 DIAGNOSIS — Z95810 Presence of automatic (implantable) cardiac defibrillator: Secondary | ICD-10-CM

## 2018-07-27 DIAGNOSIS — I10 Essential (primary) hypertension: Secondary | ICD-10-CM

## 2018-07-27 DIAGNOSIS — I2510 Atherosclerotic heart disease of native coronary artery without angina pectoris: Secondary | ICD-10-CM | POA: Insufficient documentation

## 2018-07-27 DIAGNOSIS — I69391 Dysphagia following cerebral infarction: Secondary | ICD-10-CM

## 2018-07-27 DIAGNOSIS — E785 Hyperlipidemia, unspecified: Secondary | ICD-10-CM

## 2018-07-27 DIAGNOSIS — B20 Human immunodeficiency virus [HIV] disease: Secondary | ICD-10-CM

## 2018-07-27 DIAGNOSIS — J438 Other emphysema: Secondary | ICD-10-CM

## 2018-07-27 DIAGNOSIS — I120 Hypertensive chronic kidney disease with stage 5 chronic kidney disease or end stage renal disease: Secondary | ICD-10-CM

## 2018-07-27 DIAGNOSIS — E1169 Type 2 diabetes mellitus with other specified complication: Secondary | ICD-10-CM

## 2018-07-27 DIAGNOSIS — E43 Unspecified severe protein-calorie malnutrition: Secondary | ICD-10-CM

## 2018-07-27 DIAGNOSIS — K5909 Other constipation: Secondary | ICD-10-CM

## 2018-07-27 DIAGNOSIS — Z992 Dependence on renal dialysis: Secondary | ICD-10-CM

## 2018-07-27 DIAGNOSIS — I132 Hypertensive heart and chronic kidney disease with heart failure and with stage 5 chronic kidney disease, or end stage renal disease: Secondary | ICD-10-CM | POA: Insufficient documentation

## 2018-07-27 DIAGNOSIS — N401 Enlarged prostate with lower urinary tract symptoms: Secondary | ICD-10-CM

## 2018-07-27 DIAGNOSIS — I480 Paroxysmal atrial fibrillation: Secondary | ICD-10-CM

## 2018-07-27 DIAGNOSIS — K219 Gastro-esophageal reflux disease without esophagitis: Secondary | ICD-10-CM

## 2018-07-27 DIAGNOSIS — N186 End stage renal disease: Secondary | ICD-10-CM

## 2018-07-27 DIAGNOSIS — I5042 Chronic combined systolic (congestive) and diastolic (congestive) heart failure: Secondary | ICD-10-CM | POA: Diagnosis not present

## 2018-07-27 DIAGNOSIS — E1122 Type 2 diabetes mellitus with diabetic chronic kidney disease: Secondary | ICD-10-CM

## 2018-07-27 DIAGNOSIS — N138 Other obstructive and reflux uropathy: Secondary | ICD-10-CM

## 2018-07-27 DIAGNOSIS — I639 Cerebral infarction, unspecified: Secondary | ICD-10-CM

## 2018-07-27 LAB — PROTIME-INR
INR: 2.3 — ABNORMAL HIGH (ref 0.8–1.2)
PROTHROMBIN TIME: 24.6 s — AB (ref 11.4–15.2)

## 2018-07-27 LAB — INFLUENZA PANEL BY PCR (TYPE A & B)
Influenza A By PCR: NEGATIVE
Influenza B By PCR: NEGATIVE

## 2018-07-27 NOTE — Progress Notes (Signed)
Location:    [facility] [place] Room Number: 148 D Place of Service:  SNF (31)   CODE STATUS: DNR  No Known Allergies  Chief Complaint  Patient presents with  . Hospitalization Follow-up    Hospital Follow up    HPI:  He is a 68 year old man who has been hospitalized for acute encephalopathy; bacteremia. He is now dependent upon hemodialysis. He is due to start dialysis this weekend. He is here for short term rehab. At this time; he will more than likely require long term placement in either SNF or assisted living. There are no reports of fevers; no change in appetite; no uncontrolled pain; no agitation. He will continue to be followed for his chronic medical illnesses including: hypertension; heart failure; paf.   Past Medical History:  Diagnosis Date  . AICD (automatic cardioverter/defibrillator) present   . Alcoholism in remission ([place])   . Anemia   . Aortic insufficiency   . Arthritis   . At moderate risk for fall   . Bell's palsy   . Chronic systolic heart failure (HCC)    NYHA Class III  . CKD (chronic kidney disease), stage IV ([place])   . COPD (chronic obstructive pulmonary disease) ([place])   . Essential hypertension, benign   . HIV disease ([place]) 09/06/2016  . Hyperlipidemia   . NICM (nonischemic cardiomyopathy) ([place])   . Noncompliance with medication regimen   . NSVT (nonsustained ventricular tachycardia) ([place])   . Numbness of right jaw    Had a stoke in 01/2013. Numbness is occasional, especially when trying to chew.  . OSA (obstructive sleep apnea)   . Productive cough 08/2013   With brown sputum   . S/P aortic valve replacement with bioprosthetic valve   . Stroke ([place]) 01/2013   weakness of right side from CVA  . Type 2 diabetes mellitus ([place])   . Uses hearing aid 2014   recently received new hearing aids    Past Surgical History:  Procedure Laterality Date  . AV FISTULA PLACEMENT Left 07/20/2018   Procedure: INSERTION ARTERIOVENOUS  GORTEX GRAFT  LEFT ARM;  Surgeon: Rosetta Posner, MD;  Location: [facility];  Service: Vascular;  Laterality: Left;  . BIOPSY N/A 04/10/2014   Procedure: GASTRIC BIOPSY;  Surgeon: Daneil Dolin, MD;  Location: [facility];  Service: Endoscopy;  Laterality: N/A;  . BIOPSY  10/12/2015   Procedure: BIOPSY;  Surgeon: Daneil Dolin, MD;  [facility];  Service: Endoscopy;;  stomach bx's  . CARDIAC DEFIBRILLATOR PLACEMENT  11/12/12   [employer] Inogen MINI ICD implanted in [place] at [place] [facility] per Dr Nelly Laurence note  . COLONOSCOPY WITH PROPOFOL N/A 04/10/2014   RMR: Colonic Diverticulosis  . ESOPHAGOGASTRODUODENOSCOPY (EGD) WITH PROPOFOL N/A 04/10/2014   RMR: Mild erosive reflux esophagitis. Multiple antral polyps likely hyperplastic status post removal by hot snare cautery technique. Diffusely abnormal stomach status post gastric biopsy I suspect some of patients anemaia may be due to intermittent oozing from the stomach. It would be difficult and a risky proposition to attempt complete removal of all of his gastric polyps.  . ESOPHAGOGASTRODUODENOSCOPY (EGD) WITH PROPOFOL N/A 10/12/2015   Procedure: ESOPHAGOGASTRODUODENOSCOPY (EGD) WITH PROPOFOL;  Surgeon: Daneil Dolin, MD;  [facility];  Service: Endoscopy;  Laterality: N/A;  1610 - moved to 9:15  . INSERTION OF DIALYSIS CATHETER Right 07/20/2018   Procedure: EXCHANGE OF DIALYSIS  CATHETER RIGHT INTERNAL JUGULAR;  Surgeon: Rosetta Posner, MD;  Location: [facility] OR;  Service: Vascular;  Laterality: Right;  . IR FLUORO GUIDE CV LINE RIGHT  07/09/2018  . IR US GUIDE VASC ACCESS RIGHT  07/09/2018  . POLYPECTOMY N/A 04/10/2014   Procedure: GASTRIC POLYPECTOMY;  Surgeon: Daneil Dolin, MD;  Location[facility];  Service: Endoscopy;  Laterality: N/A;  . RIGHT HEART CATHETERIZATION N/A 02/06/2014   Procedure: RIGHT HEART CATH;  Surgeon: Larey Dresser, MD;  [facility];  Service:  Cardiovascular;  Laterality: N/A;    Social History   Socioeconomic History  . Marital status: Single    Spouse name: Not on file  . Number of children: Not on file  . Years of education: Not on file  . Highest education level: Not on file  Occupational History  . Occupation: disabled  Social Needs  . Financial resource strain: Not hard at all  . Food insecurity:    Worry: Never true    Inability: Never true  . Transportation needs:    Medical: No    Non-medical: No  Tobacco Use  . Smoking status: Former Smoker    Types: Cigarettes    Last attempt to quit: 11/05/1993    Years since quitting: 24.7  . Smokeless tobacco: Never Used  Substance and Sexual Activity  . Alcohol use: Not Currently    Comment: former heavy ETOH, none recently  . Drug use: No    Comment: prior history of cocaine use, smoking   . Sexual activity: Yes    Birth control/protection: None  Lifestyle  . Physical activity:    Days per week: 4 days    Minutes per session: 20 min  . Stress: Not at all  Relationships  . Social connections:    Talks on phone: Never    Gets together: Once a week    Attends religious service: Never    Active member of club or organization: No    Attends meetings of clubs or organizations: Never    Relationship status: Never married  . Intimate partner violence:    Fear of current or ex partner: No    Emotionally abused: No    Physically abused: No    Forced sexual activity: No  Other Topics Concern  . Not on file  Social History Narrative   Recently moved from [place] to be with family in [place] (2015).  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  Has h/o polysubstance abuse.  Baseline - able to ambulate, mild cognitive delay, able to toilet and shower himself, occasional use of walker/cane.   2019- resident of [facility]. Unable to ambulate unassisted.            Family History  Problem Relation Age of Onset  . Kidney disease Mother    . Kidney disease Sister   . Colon cancer Neg Hx       VITAL SIGNS BP 112/67   Pulse 90   Temp 98 F (36.7 C)   Resp 20   Ht 5\' 3"  (1.6 m)   Wt 151 lb (68.5 kg)   SpO2 96%   BMI 26.75 kg/m   Outpatient Encounter Medications as of 07/27/2018  Medication Sig  . acetaminophen (TYLENOL) 325 MG tablet Take 2 tablets (650 mg total) by mouth every 4 (four) hours as needed for mild pain (or temp > 37.5 C (99.5 F)).  Marland Kitchen albuterol (PROVENTIL) (2.5 MG/3ML)  0.083% nebulizer solution Take 3 mLs (2.5 mg total) by nebulization every 4 (four) hours as needed for wheezing or shortness of breath.  Marland Kitchen atorvastatin (LIPITOR) 80 MG tablet Take 80 mg by mouth daily.   . bictegravir-emtricitabine-tenofovir AF (BIKTARVY) 50-200-25 MG TABS tablet Take 1 tablet by mouth daily.  . Cholecalciferol (VITAMIN D3) 2000 UNITS TABS Take 2,000 mg by mouth daily.  . coal tar (NEUTROGENA T-GEL) 0.5 % shampoo Apply to scalp on shower days (Twice a week) for dandruff and dry scalp Wed., and Sat.  . docusate sodium (COLACE) 100 MG capsule Take 100 mg by mouth daily.  . feeding supplement, ENSURE ENLIVE, (ENSURE ENLIVE) LIQD Take 237 mLs by mouth 2 (two) times daily between meals.  . insulin aspart (NOVOLOG) 100 UNIT/ML injection 0-9 Units, Subcutaneous, 3 times daily with meals CBG < 70: implement hypoglycemia protocol CBG 70 - 120: 0 units CBG 121 - 150: 1 unit CBG 151 - 200: 2 units CBG 201 - 250: 3 units CBG 251 - 300: 5 units CBG 301 - 350: 7 units CBG 351 - 400: 9 units CBG > 400: call MD  . metoprolol succinate (TOPROL-XL) 25 MG 24 hr tablet Take 1 tablet (25 mg total) by mouth daily.  . mometasone-formoterol (DULERA) 100-5 MCG/ACT AERO Inhale 2 puffs into the lungs 2 (two) times daily.  . multivitamin (RENA-VIT) TABS tablet Take 1 tablet by mouth at bedtime.  . NON FORMULARY Diet Type:  Dysphagia 2 diet with Honey Thick Liquids, NAS, Cons CHO  . predniSONE (DELTASONE) 10 MG tablet Take 3 tablets (30 mg) daily  for 1 days, then, Take 2 tablets (20 mg) daily for 1 days, then, Take 1 tablets (10 mg) daily for 1 days, then stop  . ranitidine (ZANTAC) 150 MG tablet Take 150 mg by mouth at bedtime.  . senna-docusate (SENEXON-S) 8.6-50 MG tablet Take 1 tablet by mouth daily.  . tamsulosin (FLOMAX) 0.4 MG CAPS capsule Take 0.4 mg by mouth daily.  Marland Kitchen warfarin (COUMADIN) 5 MG tablet Take 1 tablet (5 mg total) by mouth daily at 6 PM.   No facility-administered encounter medications on file as of 07/27/2018.      SIGNIFICANT DIAGNOSTIC EXAMS  TODAY:   07-06-18: chest x-ray: Enlargement of cardiac silhouette with vascular congestion and question minimal pulmonary edema.  07-06-18: ct of head;  1. New age indeterminate RIGHT basal ganglia small infarcts. 2. Old RIGHT temporal occipital/PCA territory infarct. 3. Old basal ganglia, thalami, pontine and cerebellar small infarcts. Moderate to severe chronic small vessel ischemic changes. 4. Moderate parenchymal brain volume loss, advanced for age.  07-07-18: US renal: Medical renal disease changes of both kidneys. Small renal cysts in both kidneys. No evidence of hydronephrosis.  07-11-18: ct of head:  1. Stable head CT, with no new acute intracranial abnormality. 2. In band cerebral atrophy with chronic small vessel ischemic disease with multiple chronic ischemic infarcts as above. 3. Acute pan sinusitis, worsened from previous.  05-12-19 ct of abdomen and pelvis: Diverticulosis without diverticulitis. Bilateral renal cysts. Mild bibasilar atelectasis.  07-12-18: 2-d echo:   1. The left ventricle has severely reduced systolic function, with an ejection fraction of 20-25%. The cavity size was mildly dilated. There is mildly increased left ventricular wall thickness. Left ventricular diastolic Doppler parameters are  consistent with impaired relaxation Left ventricular diffuse hypokinesis.  2. The right ventricle has normal systolic function. The cavity was  normal. There is no increase in right ventricular wall thickness.  3.  Left atrial size was mildly dilated.  4. The mitral valve is normal in structure. There is mild thickening.  5. The tricuspid valve is normal in structure.  6. AVR.  7. The pulmonic valve was normal in structure.  8. Severe global reduction in LV systolic function; mild LVH; mild LVE; mild LAE; s/p AVR (not well visualized; mean gradient 12 mmHg; AVA 1.5 cm2 but may be underestimated); no vegetations but cannot R/O with this study; suggest TEE if clinically  indicated.  07-20-18: chest x-ray: Right IJ dialysis catheter in good position without complicating features. Cardiac enlargement and vascular congestion without overt pulmonary edema.   LABS REVIEWED TODAY:   05-22-18: hgb a1c 6.5 chol 100 ldl 43; trig 170; hdl 23 07-04-18: wbc  3.7; hgb 14.0; hct 45.6; mcv 91.8; plt 124; glucose 100; bun 45; creat 2.44; k+ 4.7; na++ 139 BNP 679.0  07-06-18: wbc 1.9; hgb 15.2; hct 48.8; mcv 91.2 plt 107; glucose 136; bun 43; creat 3.01; k+ 5.9; na++ 136; ca 8.8 ast 70 alt 46; albumin 3.0 BNP 554.5   blood culture: no growth /e-coli  07-08-18: wbc 13.8; hgb 13.1; hct 41.1; mcv 93.2 plt 73 glucose 108 ;bun 74; creat 5.32; k+ 5.5; na++ 139; ca 7.5; ast 80 alt 117; albumin 2.3  07-09-18; tsh 3.098 07-17-18: wbc 3.1; hgb 1.8; hct 37.3; mcv 89.9 plt 103 glucose 114; bun 95; creat 6.00; k+ 3.5; na++ 139; ca 6.4 mag 2.1 07-27-18: INR 2.3: coumadin 5 mg daily    Review of Systems  Reason unable to perform ROS: confusion     Physical Exam Constitutional:      General: He is not in acute distress.    Appearance: He is well-developed. He is not diaphoretic.  Neck:     Musculoskeletal: Neck supple.     Thyroid: No thyromegaly.  Cardiovascular:     Rate and Rhythm: Normal rate and regular rhythm.     Pulses: Normal pulses.     Heart sounds: Normal heart sounds.  Pulmonary:     Effort: Pulmonary effort is normal. No respiratory distress.      Breath sounds: Normal breath sounds.  Abdominal:     General: Bowel sounds are normal. There is no distension.     Palpations: Abdomen is soft.     Tenderness: There is no abdominal tenderness.  Musculoskeletal:     Right lower leg: No edema.     Left lower leg: No edema.     Comments: Able to move all extremities   Lymphadenopathy:     Cervical: No cervical adenopathy.  Skin:    General: Skin is warm and dry.     Comments: Right arm a/v fistula +thrill + bruit   Neurological:     Mental Status: He is alert. He is disoriented.     Comments: Is aware   Psychiatric:        Mood and Affect: Mood normal.       ASSESSMENT/ PLAN:  TODAY:   1. Essential hypertension: is stable b/p 112/67: will continue toprol xl 25 mg daily   2. Chronic combined systolic and diastolic heart failure: ICD in place  without change EF 20-25% (07-12-18): will continue toprol xl 25 mg daily   3. PAF (paroxysmal atrial fibrillation)/status post bioprosthetic AVR  heart rate stable; will continue toprol xl 25 mg daily for rate control is on chronic coumadin therapy 5 mg daily   4. Other emphysema: is stable will continue dulera 100/5 mcg 2  puffs twice daily has albuterol neb every 4 hours as needed   5.  Dyslipidemia associated with type 2 diabetes mellitus: is stable ldl 43; will continue lipitor 80 mg daily   6. Type 2 diabetes mellitus with hypertension and end stage renal disease on dialysis: is stable hgb a1c 6.5; will continue Novolog SSI: 121-150: 1 unit; 151-200: 2 units; 201-250: 3 units; 251-300: 5 units; 301-350: 7 units; 351-400: 9 units.   7. Chronic kidney disease with end stage renal disease on dialysis due to type 2 diabetes mellitus/dialysis dependent: is without change is being dialyzed on T-R-Sa regimen   8. HIV disease: is without change: will continue Biktarvy: 20-200-25 mg daily   9. CVA/mulitple CVA : is neurologically stable will not make changes will monitor   10. [place] with  obstruction/lower urinary tract symptoms: is stable will continue flomax 0.4 mg daily   11. GERD without esophagitis: is stable will continue zantac 150 mg daily   12. Chronic constipation: is stable will continue senna s daily colace daily   13. 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: is without signs of aspiration present will continue honey thick liquids.   14. Protein calorie malnutrition, severe: is without change albumin 2.3 weight 151 pounds; will continue supplements as directed  15. Sepsis secondary to E. Coli bacteremia:. Acute metabolic encephalopathy: has completed abt on 07-22-18. Will not make changes will continue to monitor his status.   Will check cbc; bmp on 07-30-18       MD is aware of resident's narcotic use and is in agreement with current plan of care. We will attempt to wean resident as apropriate   Ok Edwards NP [facility]  Contact [phone] Monday through Friday 8am- 5pm  After hours call [phone]

## 2018-07-30 ENCOUNTER — Encounter (HOSPITAL_COMMUNITY)
Admission: RE | Admit: 2018-07-30 | Discharge: 2018-07-30 | Disposition: A | Discharge status: Home / Self Care | Payer: Medicare HMO | Source: Skilled Nursing Facility | Attending: Internal Medicine | Admitting: Internal Medicine

## 2018-07-30 DIAGNOSIS — I132 Hypertensive heart and chronic kidney disease with heart failure and with stage 5 chronic kidney disease, or end stage renal disease: Secondary | ICD-10-CM | POA: Insufficient documentation

## 2018-07-30 DIAGNOSIS — N189 Chronic kidney disease, unspecified: Secondary | ICD-10-CM | POA: Diagnosis not present

## 2018-07-30 DIAGNOSIS — I2510 Atherosclerotic heart disease of native coronary artery without angina pectoris: Secondary | ICD-10-CM | POA: Insufficient documentation

## 2018-07-30 DIAGNOSIS — I509 Heart failure, unspecified: Secondary | ICD-10-CM | POA: Insufficient documentation

## 2018-07-30 LAB — CBC
HCT: 33.6 % — ABNORMAL LOW (ref 39.0–52.0)
Hemoglobin: 10.7 g/dL — ABNORMAL LOW (ref 13.0–17.0)
MCH: 28.5 pg (ref 26.0–34.0)
MCHC: 31.8 g/dL (ref 30.0–36.0)
MCV: 89.6 fL (ref 80.0–100.0)
Platelets: 94 10*3/uL — ABNORMAL LOW (ref 150–400)
RBC: 3.75 MIL/uL — ABNORMAL LOW (ref 4.22–5.81)
RDW: 14.6 % (ref 11.5–15.5)
WBC: 6.8 10*3/uL (ref 4.0–10.5)
nRBC: 0 % (ref 0.0–0.2)

## 2018-07-30 LAB — BASIC METABOLIC PANEL
Anion gap: 14 (ref 5–15)
BUN: 93 mg/dL — ABNORMAL HIGH (ref 8–23)
CALCIUM: 7.1 mg/dL — AB (ref 8.9–10.3)
CO2: 24 mmol/L (ref 22–32)
Chloride: 97 mmol/L — ABNORMAL LOW (ref 98–111)
Creatinine, Ser: 7.47 mg/dL — ABNORMAL HIGH (ref 0.61–1.24)
GFR calc Af Amer: 8 mL/min — ABNORMAL LOW (ref >60)
GFR, EST NON AFRICAN AMERICAN: 7 mL/min — AB (ref >60)
Glucose, Bld: 90 mg/dL (ref 70–99)
Potassium: 4.2 mmol/L (ref 3.5–5.1)
Sodium: 135 mmol/L (ref 135–145)

## 2018-07-31 ENCOUNTER — Non-Acute Institutional Stay (SKILLED_NURSING_FACILITY): Payer: Medicare HMO | Admitting: Adult Health

## 2018-07-31 ENCOUNTER — Telehealth: Payer: Self-pay | Admitting: Nurse Practitioner

## 2018-07-31 ENCOUNTER — Encounter: Payer: Self-pay | Admitting: Adult Health

## 2018-07-31 DIAGNOSIS — N401 Enlarged prostate with lower urinary tract symptoms: Secondary | ICD-10-CM

## 2018-07-31 DIAGNOSIS — E1122 Type 2 diabetes mellitus with diabetic chronic kidney disease: Secondary | ICD-10-CM | POA: Insufficient documentation

## 2018-07-31 DIAGNOSIS — I480 Paroxysmal atrial fibrillation: Secondary | ICD-10-CM | POA: Insufficient documentation

## 2018-07-31 DIAGNOSIS — N186 End stage renal disease: Secondary | ICD-10-CM

## 2018-07-31 DIAGNOSIS — N138 Other obstructive and reflux uropathy: Secondary | ICD-10-CM | POA: Insufficient documentation

## 2018-07-31 DIAGNOSIS — K219 Gastro-esophageal reflux disease without esophagitis: Secondary | ICD-10-CM | POA: Insufficient documentation

## 2018-07-31 DIAGNOSIS — I120 Hypertensive chronic kidney disease with stage 5 chronic kidney disease or end stage renal disease: Secondary | ICD-10-CM

## 2018-07-31 DIAGNOSIS — Z992 Dependence on renal dialysis: Secondary | ICD-10-CM

## 2018-07-31 DIAGNOSIS — I69391 Dysphagia following cerebral infarction: Secondary | ICD-10-CM | POA: Insufficient documentation

## 2018-07-31 DIAGNOSIS — E1169 Type 2 diabetes mellitus with other specified complication: Secondary | ICD-10-CM | POA: Insufficient documentation

## 2018-07-31 DIAGNOSIS — K5909 Other constipation: Secondary | ICD-10-CM | POA: Insufficient documentation

## 2018-07-31 DIAGNOSIS — E785 Hyperlipidemia, unspecified: Secondary | ICD-10-CM

## 2018-07-31 HISTORY — DX: Type 2 diabetes mellitus with diabetic chronic kidney disease: E11.22

## 2018-07-31 HISTORY — DX: Type 2 diabetes mellitus with diabetic chronic kidney disease: Z99.2

## 2018-07-31 NOTE — Telephone Encounter (Signed)
[place] alert for treated VF episode. EGM shows appropriately detected VT in VF zone. ATP failed to terminate. Rhythm persisted and charge began. Pt then returned to SR but charge was committed. Charge delivered while in [place]. Will call patient 3/4 to discuss symptoms. Pt lives in a nursing home per Epic and is DNR. Per industry, options to prevent inappropriate shocks in the future: increase VF zone to 210bpm and increase duration or 2nd zone with VF zone at 240bpm. Routed to Dr Lovena Le for review.

## 2018-07-31 NOTE — Progress Notes (Signed)
Location:   [facility] Room Number: 148 D Place of Service:  SNF (31)   CODE STATUS: DNR  No Known Allergies  Chief Complaint  Patient presents with  . Acute Visit    Dialysis concerns    HPI:  He has missed dialysis Saturday and did not receive treatment today. The center reports his consent to treat has not been signed and he is not capable to sign. He has now missed 2 treatments. I have discussed his situation with Ronalee Belts. I may need to send him to the ED for emergent dialysis. His family has come in and have signed his consent. He will be dialysis in the AM and again on Thursday.   Past Medical History:  Diagnosis Date  . AICD (automatic cardioverter/defibrillator) present   . Alcoholism in remission ([place])   . Anemia   . Aortic insufficiency   . Arthritis   . At moderate risk for fall   . Bell's palsy   . Chronic systolic heart failure (HCC)    NYHA Class III  . CKD (chronic kidney disease), stage IV ([place])   . COPD (chronic obstructive pulmonary disease) ([place])   . Essential hypertension, benign   . HIV disease ([place]) 09/06/2016  . Hyperlipidemia   . NICM (nonischemic cardiomyopathy) ([place])   . Noncompliance with medication regimen   . NSVT (nonsustained ventricular tachycardia) ([place])   . Numbness of right jaw    Had a stoke in 01/2013. Numbness is occasional, especially when trying to chew.  . OSA (obstructive sleep apnea)   . Productive cough 08/2013   With brown sputum   . S/P aortic valve replacement with bioprosthetic valve   . Stroke ([place]) 01/2013   weakness of right side from CVA  . Type 2 diabetes mellitus ([place])   . Uses hearing aid 2014   recently received new hearing aids    Past Surgical History:  Procedure Laterality Date  . AV FISTULA PLACEMENT Left 07/20/2018   Procedure: INSERTION ARTERIOVENOUS GORTEX GRAFT  LEFT ARM;  Surgeon: Rosetta Posner, MD;  Location: [facility];  Service: Vascular;  Laterality: Left;  . BIOPSY N/A  04/10/2014   Procedure: GASTRIC BIOPSY;  Surgeon: Daneil Dolin, MD;  Location: [facility];  Service: Endoscopy;  Laterality: N/A;  . BIOPSY  10/12/2015   Procedure: BIOPSY;  Surgeon: Daneil Dolin, MD;  [facility];  Service: Endoscopy;;  stomach bx's  . CARDIAC DEFIBRILLATOR PLACEMENT  11/12/12   [employer] Inogen MINI ICD implanted in [place] at [facility]R per Dr Nelly Laurence note  . COLONOSCOPY WITH PROPOFOL N/A 04/10/2014   RMR: Colonic Diverticulosis  . ESOPHAGOGASTRODUODENOSCOPY (EGD) WITH PROPOFOL N/A 04/10/2014   RMR: Mild erosive reflux esophagitis. Multiple antral polyps likely hyperplastic status post removal by hot snare cautery technique. Diffusely abnormal stomach status post gastric biopsy I suspect some of patients anemaia may be due to intermittent oozing from the stomach. It would be difficult and a risky proposition to attempt complete removal of all of his gastric polyps.  . ESOPHAGOGASTRODUODENOSCOPY (EGD) WITH PROPOFOL N/A 10/12/2015   Procedure: ESOPHAGOGASTRODUODENOSCOPY (EGD) WITH PROPOFOL;  Surgeon: Daneil Dolin, MD;  [facility];  Service: Endoscopy;  Laterality: N/A;  8099 - moved to 9:15  . INSERTION OF DIALYSIS CATHETER Right 07/20/2018   Procedure: EXCHANGE OF DIALYSIS CATHETER RIGHT INTERNAL JUGULAR;  Surgeon: Rosetta Posner, MD;  Location:  [facility];  Service: Vascular;  Laterality: Right;  . IR FLUORO GUIDE CV LINE RIGHT  07/09/2018  . IR US GUIDE VASC ACCESS RIGHT  07/09/2018  . POLYPECTOMY N/A 04/10/2014   Procedure: GASTRIC POLYPECTOMY;  Surgeon: Daneil Dolin, MD;  Location: [facility];  Service: Endoscopy;  Laterality: N/A;  . RIGHT HEART CATHETERIZATION N/A 02/06/2014   Procedure: RIGHT HEART CATH;  Surgeon: Larey Dresser, MD;  [facility] CATH LAB;  Service: Cardiovascular;  Laterality: N/A;    Social History   Socioeconomic History  . Marital status: Single    Spouse name: Not on  file  . Number of children: Not on file  . Years of education: Not on file  . Highest education level: Not on file  Occupational History  . Occupation: disabled  Social Needs  . Financial resource strain: Not hard at all  . Food insecurity:    Worry: Never true    Inability: Never true  . Transportation needs:    Medical: No    Non-medical: No  Tobacco Use  . Smoking status: Former Smoker    Types: Cigarettes    Last attempt to quit: 11/05/1993    Years since quitting: 24.7  . Smokeless tobacco: Never Used  Substance and Sexual Activity  . Alcohol use: Not Currently    Comment: former heavy ETOH, none recently  . Drug use: No    Comment: prior history of cocaine use, smoking   . Sexual activity: Yes    Birth control/protection: None  Lifestyle  . Physical activity:    Days per week: 4 days    Minutes per session: 20 min  . Stress: Not at all  Relationships  . Social connections:    Talks on phone: Never    Gets together: Once a week    Attends religious service: Never    Active member of club or organization: No    Attends meetings of clubs or organizations: Never    Relationship status: Never married  . Intimate partner violence:    Fear of current or ex partner: No    Emotionally abused: No    Physically abused: No    Forced sexual activity: No  Other Topics Concern  . Not on file  Social History Narrative   Recently moved from [place] to be with family in [place] (2015).  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  Has h/o polysubstance abuse.  Baseline - able to ambulate, mild cognitive delay, able to toilet and shower himself, occasional use of walker/cane.   2019- resident of [facility]. Unable to ambulate unassisted.            Family History  Problem Relation Age of Onset  . Kidney disease Mother   . Kidney disease Sister   . Colon cancer Neg Hx       VITAL SIGNS BP 127/75   Pulse 88   Temp 98.5 F (36.9 C)   Resp  20   Ht 5\' 3"  (1.6 m)   Wt 154 lb 3.2 oz (69.9 kg)   SpO2 96%   BMI 27.32 kg/m   Outpatient Encounter Medications as of 07/31/2018  Medication Sig  . acetaminophen (TYLENOL) 325 MG tablet Take 2 tablets (650 mg total) by mouth every 4 (four) hours as needed for mild pain (or temp > 37.5 C (99.5 F)).  Marland Kitchen albuterol (PROVENTIL) (2.5 MG/3ML) 0.083% nebulizer solution Take 3 mLs (2.5 mg total) by  nebulization every 4 (four) hours as needed for wheezing or shortness of breath.  Marland Kitchen atorvastatin (LIPITOR) 80 MG tablet Take 80 mg by mouth daily.   . bictegravir-emtricitabine-tenofovir AF (BIKTARVY) 50-200-25 MG TABS tablet Take 1 tablet by mouth daily.  . calcium carbonate (TUMS EX) 750 MG chewable tablet Chew 1 tablet by mouth 2 (two) times daily.  . Cholecalciferol (VITAMIN D3) 2000 UNITS TABS Take 2,000 mg by mouth daily.  . coal tar (NEUTROGENA T-GEL) 0.5 % shampoo Apply to scalp on shower days (Twice a week) for dandruff and dry scalp Wed., and Sat.  . docusate sodium (COLACE) 100 MG capsule Take 100 mg by mouth daily.  . feeding supplement, ENSURE ENLIVE, (ENSURE ENLIVE) LIQD Take 237 mLs by mouth 2 (two) times daily between meals.  . insulin aspart (NOVOLOG) 100 UNIT/ML injection 0-9 Units, Subcutaneous, 3 times daily with meals CBG < 70: implement hypoglycemia protocol CBG 70 - 120: 0 units CBG 121 - 150: 1 unit CBG 151 - 200: 2 units CBG 201 - 250: 3 units CBG 251 - 300: 5 units CBG 301 - 350: 7 units CBG 351 - 400: 9 units CBG > 400: call MD  . metoprolol succinate (TOPROL-XL) 25 MG 24 hr tablet Take 1 tablet (25 mg total) by mouth daily.  . mometasone-formoterol (DULERA) 100-5 MCG/ACT AERO Inhale 2 puffs into the lungs 2 (two) times daily.  . multivitamin (RENA-VIT) TABS tablet Take 1 tablet by mouth at bedtime.  . NON FORMULARY Diet Type:  Dysphagia 2 diet with Honey Thick Liquids, NAS, Cons CHO  . ranitidine (ZANTAC) 150 MG tablet Take 150 mg by mouth at bedtime.  . senna-docusate  (SENEXON-S) 8.6-50 MG tablet Take 1 tablet by mouth daily.  . tamsulosin (FLOMAX) 0.4 MG CAPS capsule Take 0.4 mg by mouth daily.  Marland Kitchen warfarin (COUMADIN) 5 MG tablet Take 1 tablet (5 mg total) by mouth daily at 6 PM.  . [DISCONTINUED] predniSONE (DELTASONE) 10 MG tablet Take 3 tablets (30 mg) daily for 1 days, then, Take 2 tablets (20 mg) daily for 1 days, then, Take 1 tablets (10 mg) daily for 1 days, then stop (Patient not taking: Reported on 07/31/2018)   No facility-administered encounter medications on file as of 07/31/2018.      SIGNIFICANT DIAGNOSTIC EXAMS  PREVIOUS:   07-06-18: chest x-ray: Enlargement of cardiac silhouette with vascular congestion and question minimal pulmonary edema.  07-06-18: ct of head;  1. New age indeterminate RIGHT basal ganglia small infarcts. 2. Old RIGHT temporal occipital/PCA territory infarct. 3. Old basal ganglia, thalami, pontine and cerebellar small infarcts. Moderate to severe chronic small vessel ischemic changes. 4. Moderate parenchymal brain volume loss, advanced for age.  07-07-18: US renal: Medical renal disease changes of both kidneys. Small renal cysts in both kidneys. No evidence of hydronephrosis.  07-11-18: ct of head:  1. Stable head CT, with no new acute intracranial abnormality. 2. In band cerebral atrophy with chronic small vessel ischemic disease with multiple chronic ischemic infarcts as above. 3. Acute pan sinusitis, worsened from previous.  05-12-19 ct of abdomen and pelvis: Diverticulosis without diverticulitis. Bilateral renal cysts. Mild bibasilar atelectasis.  07-12-18: 2-d echo:   1. The left ventricle has severely reduced systolic function, with an ejection fraction of 20-25%. The cavity size was mildly dilated. There is mildly increased left ventricular wall thickness. Left ventricular diastolic Doppler parameters are  consistent with impaired relaxation Left ventricular diffuse hypokinesis.  2. The right ventricle has  normal systolic  function. The cavity was normal. There is no increase in right ventricular wall thickness.  3. Left atrial size was mildly dilated.  4. The mitral valve is normal in structure. There is mild thickening.  5. The tricuspid valve is normal in structure.  6. AVR.  7. The pulmonic valve was normal in structure.  8. Severe global reduction in LV systolic function; mild LVH; mild LVE; mild LAE; s/p AVR (not well visualized; mean gradient 12 mmHg; AVA 1.5 cm2 but may be underestimated); no vegetations but cannot R/O with this study; suggest TEE if clinically  indicated.  07-20-18: chest x-ray: Right IJ dialysis catheter in good position without complicating features. Cardiac enlargement and vascular congestion without overt pulmonary edema.  NO NEW EXAMS.    LABS REVIEWED PREVIOUS:   05-22-18: hgb a1c 6.5 chol 100 ldl 43; trig 170; hdl 23 07-04-18: wbc  3.7; hgb 14.0; hct 45.6; mcv 91.8; plt 124; glucose 100; bun 45; creat 2.44; k+ 4.7; na++ 139 BNP 679.0  07-06-18: wbc 1.9; hgb 15.2; hct 48.8; mcv 91.2 plt 107; glucose 136; bun 43; creat 3.01; k+ 5.9; na++ 136; ca 8.8 ast 70 alt 46; albumin 3.0 BNP 554.5   blood culture: no growth /e-coli  07-08-18: wbc 13.8; hgb 13.1; hct 41.1; mcv 93.2 plt 73 glucose 108 ;bun 74; creat 5.32; k+ 5.5; na++ 139; ca 7.5; ast 80 alt 117; albumin 2.3  07-09-18; tsh 3.098 07-17-18: wbc 3.1; hgb 1.8; hct 37.3; mcv 89.9 plt 103 glucose 114; bun 95; creat 6.00; k+ 3.5; na++ 139; ca 6.4 mag 2.1 07-27-18: INR 2.3: coumadin 5 mg daily    NO NEW LABS.   Review of Systems  Reason unable to perform ROS: confusion.    Physical Exam Constitutional:      General: He is not in acute distress.    Appearance: Normal appearance. He is well-developed. He is not diaphoretic.  Neck:     Musculoskeletal: Neck supple.     Thyroid: No thyromegaly.  Cardiovascular:     Rate and Rhythm: Normal rate and regular rhythm.     Pulses: Normal pulses.     Heart sounds: Normal  heart sounds.  Pulmonary:     Effort: Pulmonary effort is normal. No respiratory distress.     Breath sounds: Normal breath sounds.  Abdominal:     General: Bowel sounds are normal. There is no distension.     Palpations: Abdomen is soft.     Tenderness: There is no abdominal tenderness.  Musculoskeletal:     Right lower leg: No edema.     Left lower leg: No edema.     Comments: Is able to move all extremities   Lymphadenopathy:     Cervical: No cervical adenopathy.  Skin:    General: Skin is warm and dry.     Comments:  Right arm a/v fistula +thrill + bruit    Neurological:     Mental Status: He is alert. He is disoriented.  Psychiatric:        Mood and Affect: Mood normal.       ASSESSMENT/ PLAN:  TODAY:   1. 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   MD is aware of resident's narcotic use and is in agreement with current plan of care. We will attempt to wean resident as apropriate  Ok Edwards NP [facility]  Contact [phone] Monday through Friday 8am- 5pm  After hours call [phone]

## 2018-08-01 ENCOUNTER — Ambulatory Visit (INDEPENDENT_AMBULATORY_CARE_PROVIDER_SITE_OTHER): Payer: Medicare HMO | Admitting: *Deleted

## 2018-08-01 DIAGNOSIS — I429 Cardiomyopathy, unspecified: Secondary | ICD-10-CM | POA: Diagnosis not present

## 2018-08-02 ENCOUNTER — Encounter (HOSPITAL_COMMUNITY)
Admission: RE | Admit: 2018-08-02 | Discharge: 2018-08-02 | Disposition: A | Discharge status: Home / Self Care | Payer: Medicare HMO | Source: Skilled Nursing Facility | Attending: Internal Medicine | Admitting: Internal Medicine

## 2018-08-02 DIAGNOSIS — Z952 Presence of prosthetic heart valve: Secondary | ICD-10-CM | POA: Insufficient documentation

## 2018-08-02 DIAGNOSIS — Z79899 Other long term (current) drug therapy: Secondary | ICD-10-CM | POA: Diagnosis not present

## 2018-08-02 LAB — PROTIME-INR
INR: 1.9 — ABNORMAL HIGH (ref 0.8–1.2)
Prothrombin Time: 21.8 seconds — ABNORMAL HIGH (ref 11.4–15.2)

## 2018-08-03 ENCOUNTER — Non-Acute Institutional Stay (SKILLED_NURSING_FACILITY): Payer: Medicare HMO | Admitting: Adult Health

## 2018-08-03 ENCOUNTER — Encounter: Payer: Self-pay | Admitting: Adult Health

## 2018-08-03 DIAGNOSIS — I5042 Chronic combined systolic (congestive) and diastolic (congestive) heart failure: Secondary | ICD-10-CM

## 2018-08-03 DIAGNOSIS — I480 Paroxysmal atrial fibrillation: Secondary | ICD-10-CM

## 2018-08-03 DIAGNOSIS — I10 Essential (primary) hypertension: Secondary | ICD-10-CM | POA: Diagnosis not present

## 2018-08-03 LAB — CUP PACEART REMOTE DEVICE CHECK
Battery Remaining Longevity: 30 mo
Battery Remaining Percentage: 57 %
Brady Statistic RV Percent Paced: 0 %
Date Time Interrogation Session: 20200304095100
HIGH POWER IMPEDANCE MEASURED VALUE: 62 Ohm
Implantable Lead Location: 753860
Implantable Lead Model: 180
Lead Channel Impedance Value: 439 Ohm
Lead Channel Pacing Threshold Amplitude: 1.3 V
Lead Channel Pacing Threshold Pulse Width: 0.4 ms
Lead Channel Setting Pacing Amplitude: 2.5 V
Lead Channel Setting Pacing Pulse Width: 0.4 ms
Lead Channel Setting Sensing Sensitivity: 0.6 mV
MDC IDC LEAD IMPLANT DT: 20140617
MDC IDC LEAD SERIAL: 310936
MDC IDC PG IMPLANT DT: 20140617
Pulse Gen Serial Number: 436636

## 2018-08-03 NOTE — Progress Notes (Signed)
Location:   [facility] Room Number: 148 D Place of Service:  SNF (31)   CODE STATUS: DNR  No Known Allergies  Chief Complaint  Patient presents with  . Medical Management of Chronic Issues    Chronic combined systolic and diastolic heart failure; PAF(paroxsymal atrial fibrillation) essential hypertension benign. Weekly follow up for the first 30 days post hospitalization     HPI:  He is a 68 year old short term rehab patient being seen for the management of his chronic illnesses: chf; afib; hypertension. He denies any uncontrolled pain; no chest pain; no anxiety or agitation. He continues to participate in therapy.   Past Medical History:  Diagnosis Date  . AICD (automatic cardioverter/defibrillator) present   . Alcoholism in remission ([place])   . Anemia   . Aortic insufficiency   . Arthritis   . At moderate risk for fall   . Bell's palsy   . Chronic systolic heart failure (HCC)    NYHA Class III  . CKD (chronic kidney disease), stage IV ([place])   . COPD (chronic obstructive pulmonary disease) ([place])   . Essential hypertension, benign   . HIV disease ([place]) 09/06/2016  . Hyperlipidemia   . NICM (nonischemic cardiomyopathy) ([place])   . Noncompliance with medication regimen   . NSVT (nonsustained ventricular tachycardia) ([place])   . Numbness of right jaw    Had a stoke in 01/2013. Numbness is occasional, especially when trying to chew.  . OSA (obstructive sleep apnea)   . Productive cough 08/2013   With brown sputum   . S/P aortic valve replacement with bioprosthetic valve   . Stroke ([place]) 01/2013   weakness of right side from CVA  . Type 2 diabetes mellitus ([place])   . Uses hearing aid 2014   recently received new hearing aids    Past Surgical History:  Procedure Laterality Date  . AV FISTULA PLACEMENT Left 07/20/2018   Procedure: INSERTION ARTERIOVENOUS GORTEX GRAFT  LEFT ARM;  Surgeon: Rosetta Posner, MD;  Location: [facility];  Service: Vascular;   Laterality: Left;  . BIOPSY N/A 04/10/2014   Procedure: GASTRIC BIOPSY;  Surgeon: Daneil Dolin, MD;  Location: [facility];  Service: Endoscopy;  Laterality: N/A;  . BIOPSY  10/12/2015   Procedure: BIOPSY;  Surgeon: Daneil Dolin, MD;  Location[facility];  Service: Endoscopy;;  stomach bx's  . CARDIAC DEFIBRILLATOR PLACEMENT  11/12/12   [employer] Inogen MINI ICD implanted in [place] at [facility] per Dr Nelly Laurence note  . COLONOSCOPY WITH PROPOFOL N/A 04/10/2014   RMR: Colonic Diverticulosis  . ESOPHAGOGASTRODUODENOSCOPY (EGD) WITH PROPOFOL N/A 04/10/2014   RMR: Mild erosive reflux esophagitis. Multiple antral polyps likely hyperplastic status post removal by hot snare cautery technique. Diffusely abnormal stomach status post gastric biopsy I suspect some of patients anemaia may be due to intermittent oozing from the stomach. It would be difficult and a risky proposition to attempt complete removal of all of his gastric polyps.  . ESOPHAGOGASTRODUODENOSCOPY (EGD) WITH PROPOFOL N/A 10/12/2015   Procedure: ESOPHAGOGASTRODUODENOSCOPY (EGD) WITH PROPOFOL;  Surgeon: Daneil Dolin, MD;  [facility];  Service: Endoscopy;  Laterality: N/A;  4034 - moved to 9:15  . INSERTION OF DIALYSIS CATHETER Right 07/20/2018   Procedure: EXCHANGE OF DIALYSIS CATHETER RIGHT INTERNAL JUGULAR;  Surgeon: Rosetta Posner, MD;  Location: [facility];  Service: Vascular;  Laterality: Right;  .  IR FLUORO GUIDE CV LINE RIGHT  07/09/2018  . IR US GUIDE VASC ACCESS RIGHT  07/09/2018  . POLYPECTOMY N/A 04/10/2014   Procedure: GASTRIC POLYPECTOMY;  Surgeon: Daneil Dolin, MD;  Location: [facility];  Service: Endoscopy;  Laterality: N/A;  . RIGHT HEART CATHETERIZATION N/A 02/06/2014   Procedure: RIGHT HEART CATH;  Surgeon: Larey Dresser, MD;  Location: [facility] CATH LAB;  Service: Cardiovascular;  Laterality: N/A;    Social History   Socioeconomic History  . Marital  status: Single    Spouse name: Not on file  . Number of children: Not on file  . Years of education: Not on file  . Highest education level: Not on file  Occupational History  . Occupation: disabled  Social Needs  . Financial resource strain: Not hard at all  . Food insecurity:    Worry: Never true    Inability: Never true  . Transportation needs:    Medical: No    Non-medical: No  Tobacco Use  . Smoking status: Former Smoker    Types: Cigarettes    Last attempt to quit: 11/05/1993    Years since quitting: 24.7  . Smokeless tobacco: Never Used  Substance and Sexual Activity  . Alcohol use: Not Currently    Comment: former heavy ETOH, none recently  . Drug use: No    Comment: prior history of cocaine use, smoking   . Sexual activity: Yes    Birth control/protection: None  Lifestyle  . Physical activity:    Days per week: 4 days    Minutes per session: 20 min  . Stress: Not at all  Relationships  . Social connections:    Talks on phone: Never    Gets together: Once a week    Attends religious service: Never    Active member of club or organization: No    Attends meetings of clubs or organizations: Never    Relationship status: Never married  . Intimate partner violence:    Fear of current or ex partner: No    Emotionally abused: No    Physically abused: No    Forced sexual activity: No  Other Topics Concern  . Not on file  Social History Narrative   Recently moved from [place] to be with family in [place] (2015).  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  Has h/o polysubstance abuse.  Baseline - able to ambulate, mild cognitive delay, able to toilet and shower himself, occasional use of walker/cane.   2019- resident of [facility]. Unable to ambulate unassisted.            Family History  Problem Relation Age of Onset  . Kidney disease Mother   . Kidney disease Sister   . Colon cancer Neg Hx       VITAL SIGNS BP 121/70    Pulse 77   Temp 98.1 F (36.7 C)   Resp 20   Ht 5\' 3"  (1.6 m)   Wt 154 lb 3.2 oz (69.9 kg)   SpO2 96%   BMI 27.32 kg/m   Outpatient Encounter Medications as of 08/03/2018  Medication Sig  . acetaminophen (TYLENOL) 325 MG tablet Take 2 tablets (650 mg total) by mouth every 4 (four) hours as needed for mild pain (or temp > 37.5 C (99.5 F)).  Marland Kitchen albuterol (PROVENTIL) (2.5 MG/3ML) 0.083% nebulizer solution Take 3 mLs (2.5 mg total) by nebulization every 4 (four) hours as needed for wheezing or  shortness of breath.  Marland Kitchen atorvastatin (LIPITOR) 80 MG tablet Take 80 mg by mouth daily.   . bictegravir-emtricitabine-tenofovir AF (BIKTARVY) 50-200-25 MG TABS tablet Take 1 tablet by mouth daily.  . calcium carbonate (TUMS EX) 750 MG chewable tablet Chew 1 tablet by mouth 2 (two) times daily.  . Cholecalciferol (VITAMIN D3) 2000 UNITS TABS Take 2,000 mg by mouth daily.  . coal tar (NEUTROGENA T-GEL) 0.5 % shampoo Apply to scalp on shower days (Twice a week) for dandruff and dry scalp Wed., and Sat.  . docusate sodium (COLACE) 100 MG capsule Take 100 mg by mouth daily.  . feeding supplement, ENSURE ENLIVE, (ENSURE ENLIVE) LIQD Take 237 mLs by mouth 2 (two) times daily between meals.  . insulin aspart (NOVOLOG) 100 UNIT/ML injection 0-9 Units, Subcutaneous, 3 times daily with meals CBG < 70: implement hypoglycemia protocol CBG 70 - 120: 0 units CBG 121 - 150: 1 unit CBG 151 - 200: 2 units CBG 201 - 250: 3 units CBG 251 - 300: 5 units CBG 301 - 350: 7 units CBG 351 - 400: 9 units CBG > 400: call MD  . metoprolol succinate (TOPROL-XL) 25 MG 24 hr tablet Take 1 tablet (25 mg total) by mouth daily.  . mometasone-formoterol (DULERA) 100-5 MCG/ACT AERO Inhale 2 puffs into the lungs 2 (two) times daily.  . multivitamin (RENA-VIT) TABS tablet Take 1 tablet by mouth at bedtime.  . NON FORMULARY Diet Type:  Dysphagia 2 diet with Honey Thick Liquids, NAS, Cons CHO  . ranitidine (ZANTAC) 150 MG tablet Take 150 mg  by mouth at bedtime.  . senna-docusate (SENEXON-S) 8.6-50 MG tablet Take 1 tablet by mouth daily.  . tamsulosin (FLOMAX) 0.4 MG CAPS capsule Take 0.4 mg by mouth daily.  Marland Kitchen warfarin (COUMADIN) 5 MG tablet Take 1 tablet (5 mg total) by mouth daily at 6 PM.   No facility-administered encounter medications on file as of 08/03/2018.      SIGNIFICANT DIAGNOSTIC EXAMS   PREVIOUS:   07-06-18: chest x-ray: Enlargement of cardiac silhouette with vascular congestion and question minimal pulmonary edema.  07-06-18: ct of head;  1. New age indeterminate RIGHT basal ganglia small infarcts. 2. Old RIGHT temporal occipital/PCA territory infarct. 3. Old basal ganglia, thalami, pontine and cerebellar small infarcts. Moderate to severe chronic small vessel ischemic changes. 4. Moderate parenchymal brain volume loss, advanced for age.  07-07-18: US renal: Medical renal disease changes of both kidneys. Small renal cysts in both kidneys. No evidence of hydronephrosis.  07-11-18: ct of head:  1. Stable head CT, with no new acute intracranial abnormality. 2. In band cerebral atrophy with chronic small vessel ischemic disease with multiple chronic ischemic infarcts as above. 3. Acute pan sinusitis, worsened from previous.  05-12-19 ct of abdomen and pelvis: Diverticulosis without diverticulitis. Bilateral renal cysts. Mild bibasilar atelectasis.  07-12-18: 2-d echo:   1. The left ventricle has severely reduced systolic function, with an ejection fraction of 20-25%. The cavity size was mildly dilated. There is mildly increased left ventricular wall thickness. Left ventricular diastolic Doppler parameters are  consistent with impaired relaxation Left ventricular diffuse hypokinesis.  2. The right ventricle has normal systolic function. The cavity was normal. There is no increase in right ventricular wall thickness.  3. Left atrial size was mildly dilated.  4. The mitral valve is normal in structure. There is  mild thickening.  5. The tricuspid valve is normal in structure.  6. AVR.  7. The pulmonic valve was normal  in structure.  8. Severe global reduction in LV systolic function; mild LVH; mild LVE; mild LAE; s/p AVR (not well visualized; mean gradient 12 mmHg; AVA 1.5 cm2 but may be underestimated); no vegetations but cannot R/O with this study; suggest TEE if clinically  indicated.  07-20-18: chest x-ray: Right IJ dialysis catheter in good position without complicating features. Cardiac enlargement and vascular congestion without overt pulmonary edema.  NO NEW EXAMS.    LABS REVIEWED PREVIOUS:   05-22-18: hgb a1c 6.5 chol 100 ldl 43; trig 170; hdl 23 07-04-18: wbc  3.7; hgb 14.0; hct 45.6; mcv 91.8; plt 124; glucose 100; bun 45; creat 2.44; k+ 4.7; na++ 139 BNP 679.0  07-06-18: wbc 1.9; hgb 15.2; hct 48.8; mcv 91.2 plt 107; glucose 136; bun 43; creat 3.01; k+ 5.9; na++ 136; ca 8.8 ast 70 alt 46; albumin 3.0 BNP 554.5   blood culture: no growth /e-coli  07-08-18: wbc 13.8; hgb 13.1; hct 41.1; mcv 93.2 plt 73 glucose 108 ;bun 74; creat 5.32; k+ 5.5; na++ 139; ca 7.5; ast 80 alt 117; albumin 2.3  07-09-18; tsh 3.098 07-17-18: wbc 3.1; hgb 1.8; hct 37.3; mcv 89.9 plt 103 glucose 114; bun 95; creat 6.00; k+ 3.5; na++ 139; ca 6.4 mag 2.1 07-27-18: INR 2.3: coumadin 5 mg daily    TODAY:   07-30-18: wbc  6.8; hgb 10.7; hct 33.6; mcv 89.6; plt 94 glucose 90; bun 93; creat 7.47; k+ 4.2; na++ 135; ca 7.1 08-02-18: INR 1.9    Review of Systems  Constitutional: Negative for malaise/fatigue.  Respiratory: Negative for cough and shortness of breath.   Cardiovascular: Negative for chest pain, palpitations and leg swelling.  Gastrointestinal: Negative for abdominal pain, constipation and heartburn.  Musculoskeletal: Negative for back pain, joint pain and myalgias.  Skin: Negative.   Neurological: Negative for dizziness.  Psychiatric/Behavioral: The patient is not nervous/anxious.     Physical  Exam Constitutional:      General: He is not in acute distress.    Appearance: He is well-developed. He is not diaphoretic.  Neck:     Musculoskeletal: Neck supple.     Thyroid: No thyromegaly.  Cardiovascular:     Rate and Rhythm: Normal rate and regular rhythm.     Pulses: Normal pulses.     Heart sounds: Normal heart sounds.  Pulmonary:     Effort: Pulmonary effort is normal. No respiratory distress.     Breath sounds: Normal breath sounds.  Abdominal:     General: Bowel sounds are normal. There is no distension.     Palpations: Abdomen is soft.     Tenderness: There is no abdominal tenderness.  Musculoskeletal:     Right lower leg: No edema.     Left lower leg: No edema.     Comments: Is able to move all extremities   Lymphadenopathy:     Cervical: No cervical adenopathy.  Skin:    General: Skin is warm and dry.     Comments: Right arm a/v fistula +thrill + bruit     Neurological:     Mental Status: He is alert. Mental status is at baseline.  Psychiatric:        Mood and Affect: Mood normal.       ASSESSMENT/ PLAN:   TODAY:   1. Chronic combined systolic and diastolic heart failure: ICD in place without change EF 20-25% (07-12-18) will continue toprol xl 25 mg daily   2. Essential hypertension: is stable b/p 121/70 will continue  toprol xl 25 mg daily   3. PAF (paroxysmal atrial fibrillation); status post bioprosthetic AVR heart rate stable will continue toprol xl 25 mg daily for rate control is on chronic coumadin therapy managed by Dr. Lyndel Safe   PREVIOUS   4. Other emphysema: is stable will continue dulera 100/5 mcg 2 puffs twice daily has albuterol neb every 4 hours as needed   5.  Dyslipidemia associated with type 2 diabetes mellitus: is stable ldl 43; will continue lipitor 80 mg daily   6. Type 2 diabetes mellitus with hypertension and end stage renal disease on dialysis: is stable hgb a1c 6.5; will continue Novolog SSI: 121-150: 1 unit; 151-200: 2 units;  201-250: 3 units; 251-300: 5 units; 301-350: 7 units; 351-400: 9 units.   7. Chronic kidney disease with end stage renal disease on dialysis due to type 2 diabetes mellitus/dialysis dependent: is without change is being dialyzed on T-R-Sa regimen   8. HIV disease: is without change: will continue Biktarvy: 20-200-25 mg daily   9. CVA/mulitple CVA : is neurologically stable will not make changes will monitor   10. [place] with obstruction/lower urinary tract symptoms: is stable will continue flomax 0.4 mg daily   11. GERD without esophagitis: is stable will continue zantac 150 mg daily   12. Chronic constipation: is stable will continue senna s daily colace daily   13. 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: is without signs of aspiration present will continue honey thick liquids.   14. Protein calorie malnutrition, severe: is without change albumin 2.3 weight 154 pounds; will continue supplements as directed    MD is aware of resident's narcotic use and is in agreement with current plan of care. We will attempt to wean resident as apropriate   Ok Edwards NP [facility]  Contact [phone] Monday through Friday 8am- 5pm  After hours call [phone]

## 2018-08-06 ENCOUNTER — Encounter: Payer: Self-pay | Admitting: Internal Medicine

## 2018-08-06 ENCOUNTER — Non-Acute Institutional Stay (SKILLED_NURSING_FACILITY): Payer: Medicare HMO | Admitting: Internal Medicine

## 2018-08-06 DIAGNOSIS — N186 End stage renal disease: Secondary | ICD-10-CM | POA: Insufficient documentation

## 2018-08-06 DIAGNOSIS — I5042 Chronic combined systolic (congestive) and diastolic (congestive) heart failure: Secondary | ICD-10-CM | POA: Diagnosis not present

## 2018-08-06 DIAGNOSIS — N138 Other obstructive and reflux uropathy: Secondary | ICD-10-CM

## 2018-08-06 DIAGNOSIS — J438 Other emphysema: Secondary | ICD-10-CM

## 2018-08-06 DIAGNOSIS — I480 Paroxysmal atrial fibrillation: Secondary | ICD-10-CM | POA: Diagnosis not present

## 2018-08-06 DIAGNOSIS — N189 Chronic kidney disease, unspecified: Secondary | ICD-10-CM

## 2018-08-06 DIAGNOSIS — B20 Human immunodeficiency virus [HIV] disease: Secondary | ICD-10-CM

## 2018-08-06 DIAGNOSIS — D631 Anemia in chronic kidney disease: Secondary | ICD-10-CM

## 2018-08-06 DIAGNOSIS — E785 Hyperlipidemia, unspecified: Secondary | ICD-10-CM

## 2018-08-06 DIAGNOSIS — B9620 Unspecified Escherichia coli [E. coli] as the cause of diseases classified elsewhere: Secondary | ICD-10-CM

## 2018-08-06 DIAGNOSIS — E1122 Type 2 diabetes mellitus with diabetic chronic kidney disease: Secondary | ICD-10-CM | POA: Diagnosis not present

## 2018-08-06 DIAGNOSIS — Z992 Dependence on renal dialysis: Secondary | ICD-10-CM

## 2018-08-06 DIAGNOSIS — I120 Hypertensive chronic kidney disease with stage 5 chronic kidney disease or end stage renal disease: Secondary | ICD-10-CM

## 2018-08-06 DIAGNOSIS — N401 Enlarged prostate with lower urinary tract symptoms: Secondary | ICD-10-CM

## 2018-08-06 DIAGNOSIS — E1169 Type 2 diabetes mellitus with other specified complication: Secondary | ICD-10-CM

## 2018-08-06 DIAGNOSIS — Z95810 Presence of automatic (implantable) cardiac defibrillator: Secondary | ICD-10-CM

## 2018-08-06 DIAGNOSIS — R7881 Bacteremia: Secondary | ICD-10-CM

## 2018-08-06 NOTE — Progress Notes (Signed)
Provider:  Veleta Miners, MD Location:  [place] Room Number: [phone] D Place of Service:  SNF (31)  PCP: Virgie Dad, MD Patient Care Team: Virgie Dad, MD as PCP - General (Internal Medicine) Evans Lance, MD as PCP - Electrophysiology (Cardiology) Gala Romney Cristopher Estimable, MD as Consulting Physician (Gastroenterology) Rolm Baptise as Physician Assistant (Internal Medicine)  Extended Emergency Contact Information Primary Emergency Contact: [facility] Address: [place], [place] [address] [place] Phone: [phone] Mobile Phone: [phone] Relation: Relative Secondary Emergency Contact: [facility] Address: [address] [place] [address] [place] Phone: [phone] Mobile Phone: [phone] Relation: Sister  Code Status: DNR Goals of Care: Advanced Directive information Advanced Directives 08/06/2018  Does Patient Have a Medical Advance Directive? Yes  Type of Advance Directive Out of facility DNR (pink MOST or yellow form)  Does patient want to make changes to medical advance directive? No - Patient declined  Copy of [place] in Chart? -  Would patient like information on creating a medical advance directive? -  Pre-existing out of facility DNR order (yellow form or pink MOST form) Yellow form placed in chart (order not valid for inpatient use)      Chief Complaint  Patient presents with  . New Admit To SNF    Admission    HPI: Patient is a 68 y.o. male seen today for admission to SNF for Therapy and Long term Care Patient was admitted to the Hospital from 02/07-02/27 for Urosepsis due to E Coli Bacteremia, Renal Failure and PAF   Patient is  resident of [facility] and has  h/o Chronic CHF with Cardiomyopathy  CKD Stage 3, HIV on treatment with Infectious disease. S/P AICD, hypertension, S/P CVA,Chronic Anemia, Hyperlipidemia,  Diabetes Mellitus, COPD He also has remote h/o AVR replacement with Bioprosthetic valve  He was send to the hospital due to change in mental status and Fever In the hospital he was found to have E Coli Bacteremia. TTE was negative for any Vegetation. But his EF was 20-25 % . He was treated with 2 weeks of Antibiotics He also had worsening of his CKD and became ESRD needing Dialysis. He  developed PAF and was started on Coumadin for INR 2-3 Rate was controlled on Beta Blocker Patient is back to his baseline He back in SNF. Was unable to give me much history. Seems more confused then before. But per Nurses has good appetite. No Acute issues. tolerating Dialysis  Past Medical History:  Diagnosis Date  . AICD (automatic cardioverter/defibrillator) present   . Alcoholism in remission ([place])   . Anemia   . Aortic insufficiency   . Arthritis   . At moderate risk for fall   . Bell's palsy   . Chronic systolic heart failure (HCC)    NYHA Class III  . CKD (chronic kidney disease), stage IV ([place])   . COPD (chronic obstructive pulmonary disease) ([place])   . Essential hypertension, benign   . HIV disease ([place]) 09/06/2016  . Hyperlipidemia   . NICM (nonischemic cardiomyopathy) ([place])   . Noncompliance with medication regimen   . NSVT (nonsustained ventricular tachycardia) ([place])   . Numbness of right jaw    Had a stoke in 01/2013. Numbness is occasional, especially when trying to chew.  . OSA (obstructive sleep apnea)   .  Productive cough 08/2013   With brown sputum   . S/P aortic valve replacement with bioprosthetic valve   . Stroke ([place]) 01/2013   weakness of right side from CVA  . Type 2 diabetes mellitus ([place])   . Uses hearing aid 2014   recently received new hearing aids   Past Surgical History:  Procedure Laterality Date  . AV FISTULA PLACEMENT Left 07/20/2018   Procedure: INSERTION ARTERIOVENOUS GORTEX GRAFT  LEFT ARM;  Surgeon: Rosetta Posner, MD;  Location: [facility];  Service: Vascular;   Laterality: Left;  . BIOPSY N/A 04/10/2014   Procedure: GASTRIC BIOPSY;  Surgeon: Daneil Dolin, MD;  Location: [facility];  Service: Endoscopy;  Laterality: N/A;  . BIOPSY  10/12/2015   Procedure: BIOPSY;  Surgeon: Daneil Dolin, MD;  [facility];  Service: Endoscopy;;  stomach bx's  . CARDIAC DEFIBRILLATOR PLACEMENT  11/12/12   [employer] Inogen MINI ICD implanted in [place] at [facility] AVR per Dr Nelly Laurence note  . COLONOSCOPY WITH PROPOFOL N/A 04/10/2014   RMR: Colonic Diverticulosis  . ESOPHAGOGASTRODUODENOSCOPY (EGD) WITH PROPOFOL N/A 04/10/2014   RMR: Mild erosive reflux esophagitis. Multiple antral polyps likely hyperplastic status post removal by hot snare cautery technique. Diffusely abnormal stomach status post gastric biopsy I suspect some of patients anemaia may be due to intermittent oozing from the stomach. It would be difficult and a risky proposition to attempt complete removal of all of his gastric polyps.  . ESOPHAGOGASTRODUODENOSCOPY (EGD) WITH PROPOFOL N/A 10/12/2015   Procedure: ESOPHAGOGASTRODUODENOSCOPY (EGD) WITH PROPOFOL;  Surgeon: Daneil Dolin, MD;  [facility];  Service: Endoscopy;  Laterality: N/A;  6063 - moved to 9:15  . INSERTION OF DIALYSIS CATHETER Right 07/20/2018   Procedure: EXCHANGE OF DIALYSIS CATHETER RIGHT INTERNAL JUGULAR;  Surgeon: Rosetta Posner, MD;  Location: [facility] [place];  Service: Vascular;  Laterality: Right;  . IR FLUORO GUIDE CV LINE RIGHT  07/09/2018  . IR US GUIDE VASC ACCESS RIGHT  07/09/2018  . POLYPECTOMY N/A 04/10/2014   Procedure: GASTRIC POLYPECTOMY;  Surgeon: Daneil Dolin, MD;  Location[facility];  Service: Endoscopy;  Laterality: N/A;  . RIGHT HEART CATHETERIZATION N/A 02/06/2014   Procedure: RIGHT HEART CATH;  Surgeon: Larey Dresser, MD;  Location: [facility];  Service: Cardiovascular;  Laterality: N/A;    reports that he quit smoking about 24 years ago. His  smoking use included cigarettes. He has never used smokeless tobacco. He reports previous alcohol use. He reports that he does not use drugs. Social History   Socioeconomic History  . Marital status: Single    Spouse name: Not on file  . Number of children: Not on file  . Years of education: Not on file  . Highest education level: Not on file  Occupational History  . Occupation: disabled  Social Needs  . Financial resource strain: Not hard at all  . Food insecurity:    Worry: Never true    Inability: Never true  . Transportation needs:    Medical: No    Non-medical: No  Tobacco Use  . Smoking status: Former Smoker    Types: Cigarettes    Last attempt to quit: 11/05/1993    Years since quitting: 24.7  . Smokeless tobacco: Never Used  Substance and Sexual Activity  . Alcohol use: Not Currently    Comment: former heavy ETOH, none recently  . Drug use: No  Comment: prior history of cocaine use, smoking   . Sexual activity: Yes    Birth control/protection: None  Lifestyle  . Physical activity:    Days per week: 4 days    Minutes per session: 20 min  . Stress: Not at all  Relationships  . Social connections:    Talks on phone: Never    Gets together: Once a week    Attends religious service: Never    Active member of club or organization: No    Attends meetings of clubs or organizations: Never    Relationship status: Never married  . Intimate partner violence:    Fear of current or ex partner: No    Emotionally abused: No    Physically abused: No    Forced sexual activity: No  Other Topics Concern  . Not on file  Social History Narrative   Recently moved from [place] to be with family in [place] (2015).  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  Has h/o polysubstance abuse.  Baseline - able to ambulate, mild cognitive delay, able to toilet and shower himself, occasional use of walker/cane.   2019- resident of [facility]. Unable to  ambulate unassisted.             Functional Status Survey:    Family History  Problem Relation Age of Onset  . Kidney disease Mother   . Kidney disease Sister   . Colon cancer Neg Hx     Health Maintenance  Topic Date Due  . URINE MICROALBUMIN  09/03/2018 (Originally 02/10/1961)  . HEMOGLOBIN A1C  11/21/2018  . FOOT EXAM  03/27/2019  . OPHTHALMOLOGY EXAM  04-14-19  . PNA vac Low Risk Adult (2 of 2 - PPSV23) 09/19/2021  . COLONOSCOPY  04/10/2024  . TETANUS/TDAP  02/24/2027  . Hepatitis C Screening  Completed    No Known Allergies  Outpatient Encounter Medications as of 08/06/2018  Medication Sig  . acetaminophen (TYLENOL) 325 MG tablet Take 2 tablets (650 mg total) by mouth every 4 (four) hours as needed for mild pain (or temp > 37.5 C (99.5 F)).  Marland Kitchen albuterol (PROVENTIL) (2.5 MG/3ML) 0.083% nebulizer solution Take 3 mLs (2.5 mg total) by nebulization every 4 (four) hours as needed for wheezing or shortness of breath.  Marland Kitchen atorvastatin (LIPITOR) 80 MG tablet Take 80 mg by mouth daily.   . bictegravir-emtricitabine-tenofovir AF (BIKTARVY) 50-200-25 MG TABS tablet Take 1 tablet by mouth daily.  . calcium carbonate (TUMS EX) 750 MG chewable tablet Chew 1 tablet by mouth 2 (two) times daily.  . Cholecalciferol (VITAMIN D3) 2000 UNITS TABS Take 2,000 mg by mouth daily.  . coal tar (NEUTROGENA T-GEL) 0.5 % shampoo Apply to scalp on shower days (Twice a week) for dandruff and dry scalp Wed., and Sat.  . docusate sodium (COLACE) 100 MG capsule Take 100 mg by mouth daily.  . feeding supplement, ENSURE ENLIVE, (ENSURE ENLIVE) LIQD Take 237 mLs by mouth 2 (two) times daily between meals.  . insulin aspart (NOVOLOG) 100 UNIT/ML injection 0-9 Units, Subcutaneous, 3 times daily with meals CBG < 70: implement hypoglycemia protocol CBG 70 - 120: 0 units CBG 121 - 150: 1 unit CBG 151 - 200: 2 units CBG 201 - 250: 3 units CBG 251 - 300: 5 units CBG 301 - 350: 7 units CBG 351 - 400: 9  units CBG > 400: call MD  . metoprolol succinate (TOPROL-XL) 25 MG 24 hr tablet  Take 1 tablet (25 mg total) by mouth daily.  . mometasone-formoterol (DULERA) 100-5 MCG/ACT AERO Inhale 2 puffs into the lungs 2 (two) times daily.  . multivitamin (RENA-VIT) TABS tablet Take 1 tablet by mouth at bedtime.  . NON FORMULARY Diet Type:  Dysphagia 2 diet with Honey Thick Liquids, NAS, Cons CHO  . ranitidine (ZANTAC) 150 MG tablet Take 150 mg by mouth at bedtime.  . senna-docusate (SENEXON-S) 8.6-50 MG tablet Take 1 tablet by mouth daily.  . tamsulosin (FLOMAX) 0.4 MG CAPS capsule Take 0.4 mg by mouth daily.  Marland Kitchen warfarin (COUMADIN) 6 MG tablet Take 6 mg by mouth at bedtime.  . [DISCONTINUED] warfarin (COUMADIN) 5 MG tablet Take 1 tablet (5 mg total) by mouth daily at 6 PM. (Patient not taking: Reported on 08/06/2018)   No facility-administered encounter medications on file as of 08/06/2018.     Review of Systems  Unable to perform ROS: Dementia    Vitals:   08/06/18 1017  BP: 115/65  Pulse: 74  Resp: 20  Temp: 98.8 F (37.1 C)  SpO2: 96%  Weight: 154 lb 3.2 oz (69.9 kg)  Height: 5\' 3"  (1.6 m)   Body mass index is 27.32 kg/m. Physical Exam Constitutional:      Appearance: He is well-developed.  HENT:     Head: Normocephalic.     Nose: Nose normal.     Mouth/Throat:     Mouth: Mucous membranes are dry.     Pharynx: Oropharynx is clear.  Eyes:     Pupils: Pupils are equal, round, and reactive to light.  Neck:     Musculoskeletal: Neck supple.  Cardiovascular:     Rate and Rhythm: Normal rate.     Heart sounds: Murmur present.  Pulmonary:     Effort: Pulmonary effort is normal. No respiratory distress.     Breath sounds: Normal breath sounds. No wheezing or rales.  Abdominal:     General: Bowel sounds are normal. There is no distension.     Palpations: Abdomen is soft.     Tenderness: There is no abdominal tenderness. There is no rebound.  Musculoskeletal:     Comments: Trace  edema Bilateral    Lymphadenopathy:     Cervical: No cervical adenopathy.  Skin:    General: Skin is warm and dry.  Neurological:     General: No focal deficit present.     Mental Status: He is alert.     Comments: Was following Simple Commands. Was unable to answer me much except yes or no.    Psychiatric:        Mood and Affect: Mood normal.        Behavior: Behavior normal.        Thought Content: Thought content normal.     Labs reviewed: Basic Metabolic Panel: Recent Labs    07/16/18 0430 07/17/18 0416 07/18/18 0341  07/21/18 0426 07/22/18 0523 07/23/18 1600 07/30/18 0635  NA 139 139 140   < > 138 137 137 135  K 3.7 3.5 3.7   < > 3.9 3.9 4.1 4.2  CL 104 103 105   < > 101 102 103 97*  CO2 22 23 25    < > 27 24 22 24   GLUCOSE 135* 114* 93   < > 90 146* 160* 90  BUN 87* 95* 48*   < > 33* 57* 83* 93*  CREATININE 5.94* 6.00* 3.74*   < > 3.91* 5.88* 7.66* 7.47*  CALCIUM 6.4* 6.4*  7.0*   < > 7.0* 6.8* 6.6* 7.1*  MG 2.2 2.1 1.9  --   --   --   --   --   PHOS  --   --   --   --  3.4 4.3 4.3  --    < > = values in this interval not displayed.   Liver Function Tests: Recent Labs    07/08/18 0809 07/09/18 0437 07/16/18 0430 07/21/18 0426 07/22/18 0523 07/23/18 1600  AST 80* 57* 31  --   --   --   ALT 117* 79* 36  --   --   --   ALKPHOS 78 78 62  --   --   --   BILITOT 0.9 0.8 0.5  --   --   --   PROT 6.1* 5.9* 5.5*  --   --   --   ALBUMIN 2.3* 2.1* 2.0* 2.1* 2.0* 2.0*   No results for input(s): LIPASE, AMYLASE in the last 8760 hours. Recent Labs    07/08/18 1204  AMMONIA 33   CBC: Recent Labs    07/16/18 0430 07/17/18 0416 07/18/18 0341  07/21/18 0426 07/23/18 1708 07/30/18 0635  WBC 2.8* 3.1* 6.9   < > 5.1 10.4 6.8  NEUTROABS 2.0 2.1 5.7  --   --   --   --   HGB 11.3* 11.8* 12.2*   < > 12.4* 12.0* 10.7*  HCT 35.4* 37.3* 38.7*   < > 39.1 37.4* 33.6*  MCV 90.1 89.9 89.6   < > 90.7 90.1 89.6  PLT 89* 103* 127*   < > 117* 121* 94*   < > = values in  this interval not displayed.   Cardiac Enzymes: No results for input(s): CKTOTAL, CKMB, CKMBINDEX, TROPONINI in the last 8760 hours. BNP: Invalid input(s): POCBNP Lab Results  Component Value Date   HGBA1C 6.5 (H) 05/22/2018   Lab Results  Component Value Date   TSH 3.098 07/09/2018   Lab Results  Component Value Date   VITAMINB12 1,031 (H) 01/19/2018   Lab Results  Component Value Date   FOLATE 36.5 01/19/2018   Lab Results  Component Value Date   IRON 40 (L) 04/17/2018   TIBC 260 04/17/2018   FERRITIN 39 04/17/2018    Imaging and Procedures obtained prior to SNF admission: No results found.  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% Stable with Diuresis  ESRD (end stage renal disease) on dialysis  TTS  Tolerating Dialysis  PAF (paroxysmal atrial fibrillation) ( On Coumadin'Dose adjusted for INR of 2-3 On toprol  Type 2 diabetes mellitus  A1C 6.5 in 12/19 Will repeat A1C BS mostly less then 200 Will discontinue Sliding scale  Dyslipidemia  On statin  BPH  On Flomax  ICD (implantable cardioverter-defibrillator) in place Follows with Cardiology  HIV disease  Follows with ID on treatment on Biktarvy  E coli bacteremia Was treated with 2 weeks of Antibiotics  Anemia in CKD Hgb Stable Dysphagia Will work with Speech On D2 diet     Family/ staff Communication:   Labs/tests ordered: Total time spent in this patient care encounter was 45_ minutes; greater than 50% of the visit spent counseling patient, reviewing records , Labs and coordinating care for problems addressed at this encounter.

## 2018-08-07 ENCOUNTER — Encounter (HOSPITAL_COMMUNITY)
Admission: RE | Admit: 2018-08-07 | Discharge: 2018-08-07 | Disposition: A | Discharge status: Home / Self Care | Payer: Medicare HMO | Source: Skilled Nursing Facility | Attending: Internal Medicine | Admitting: Internal Medicine

## 2018-08-07 DIAGNOSIS — I132 Hypertensive heart and chronic kidney disease with heart failure and with stage 5 chronic kidney disease, or end stage renal disease: Secondary | ICD-10-CM | POA: Insufficient documentation

## 2018-08-07 DIAGNOSIS — I2510 Atherosclerotic heart disease of native coronary artery without angina pectoris: Secondary | ICD-10-CM | POA: Diagnosis present

## 2018-08-07 DIAGNOSIS — I480 Paroxysmal atrial fibrillation: Secondary | ICD-10-CM | POA: Diagnosis present

## 2018-08-07 LAB — PROTIME-INR
INR: 2 — AB (ref 0.8–1.2)
Prothrombin Time: 22.5 seconds — ABNORMAL HIGH (ref 11.4–15.2)

## 2018-08-08 ENCOUNTER — Other Ambulatory Visit: Payer: Self-pay

## 2018-08-08 ENCOUNTER — Encounter: Payer: Self-pay | Admitting: Student

## 2018-08-08 ENCOUNTER — Ambulatory Visit (INDEPENDENT_AMBULATORY_CARE_PROVIDER_SITE_OTHER): Payer: Medicare HMO | Admitting: Student

## 2018-08-08 VITALS — BP 102/60 | HR 85 | Wt 154.2 lb

## 2018-08-08 DIAGNOSIS — I428 Other cardiomyopathies: Secondary | ICD-10-CM | POA: Diagnosis not present

## 2018-08-08 DIAGNOSIS — N186 End stage renal disease: Secondary | ICD-10-CM

## 2018-08-08 DIAGNOSIS — I5042 Chronic combined systolic (congestive) and diastolic (congestive) heart failure: Secondary | ICD-10-CM

## 2018-08-08 DIAGNOSIS — I4892 Unspecified atrial flutter: Secondary | ICD-10-CM

## 2018-08-08 DIAGNOSIS — I10 Essential (primary) hypertension: Secondary | ICD-10-CM

## 2018-08-08 DIAGNOSIS — Z952 Presence of prosthetic heart valve: Secondary | ICD-10-CM

## 2018-08-08 MED ORDER — METOPROLOL SUCCINATE ER 25 MG PO TB24: 25.0000 mg | ORAL_TABLET | Freq: Every day | ORAL | Status: DC

## 2018-08-08 NOTE — Progress Notes (Signed)
Cardiology Office Note    Date:  08/08/2018   ID:  Keith Young, DOB Feb 14, 1951, MRN [id number]  PCP:  Virgie Dad, MD  Cardiologist: Carlyle Dolly, MD (last evaluated in clinic in 2015) EP: Dr. Lovena Le  Chief Complaint  Patient presents with   Hospitalization Follow-up    History of Present Illness:    Keith Young is a 68 y.o. male with past medical history of chronic systolic CHF (EF previously 15-20% with cath in 2014 showing no significant CAD, EF 20-25% by echo in 06/2018), NICM (s/p ICD placement in 2014), bioprosthetic AVR, HTN, HLD, Type 2 DM, COPD, HIV, OSA, and ESRD who presents to the office today for hospital follow-up.  He was recently admitted to [facility] in 06/2018 for episodes of vomiting, decreased responsiveness, fevers, and hypotension while at [facility] He was found to have an AKI and required emergent placement of HD catheter and was initiated on on dialysis due to the possibility of uremic encephalopathy. Was also started on broad-spectrum antibiotics in the setting of sepsis and E.coli baceteremia. Cardiology was consulted as it was thought he was in atrial fibrillation during admission but by review of EKG's was thought to be most consistent with atrial flutter. He converted back to normal sinus rhythm during admission and IV Cardizem was transitioned to Toprol-XL given his LV dysfunction. Was also initiated on Coumadin for anticoagulation.   By review of notes, he did receive a shock on 07/31/2018 and EP was made aware. ATP failed to terminate and the charge began but he did return to normal sinus rhythm prior to the charge being administered and this was still delivered. His VF zone was increased and no changes were made to his medication regimen at that time.  In talking with the patient today, he is unable to contribute to the most history due to his dementia. He denies any recent chest pain or dyspnea on exertion. Is mostly wheelchair-bound at baseline  and a lift is used for transfer. He was unaware when his device fired last week and a caregiver from the [facility] today says they were unaware as he did not report any symptoms at that time. Unclear if he was at the [facility] or HD when the device fired. He denies any specific orthopnea or lower extremity edema. Has been started on HD and attends sessions on Tuesdays, Thursdays, and Saturdays.   Past Medical History:  Diagnosis Date   AICD (automatic cardioverter/defibrillator) present    Alcoholism in remission ([place])    Anemia    Aortic insufficiency    Arthritis    At moderate risk for fall    Bell's palsy    Chronic systolic heart failure ([place])    a. EF previously 15-20% with cath in 2014 showing no significant CAD b. EF 20-25% by echo in 06/2018   CKD (chronic kidney disease), stage IV (HCC)    COPD (chronic obstructive pulmonary disease) ([place])    Essential hypertension, benign    HIV disease ([place]) 09/06/2016   Hyperlipidemia    NICM (nonischemic cardiomyopathy) ([place])    Noncompliance with medication regimen    NSVT (nonsustained ventricular tachycardia) (HCC)    Numbness of right jaw    Had a stoke in 01/2013. Numbness is occasional, especially when trying to chew.   OSA (obstructive sleep apnea)    Productive cough 08/2013   With brown sputum    S/P aortic valve replacement with bioprosthetic valve    Stroke (  [place]) 01/2013   weakness of right side from CVA   Type 2 diabetes mellitus ([place])    Uses hearing aid 2014   recently received new hearing aids    Past Surgical History:  Procedure Laterality Date   AV FISTULA PLACEMENT Left 07/20/2018   Procedure: INSERTION ARTERIOVENOUS GORTEX GRAFT  LEFT ARM;  Surgeon: Rosetta Posner, MD;  Location: [place];  Service: Vascular;  Laterality: Left;   BIOPSY N/A 04/10/2014   Procedure: GASTRIC BIOPSY;  Surgeon: Daneil Dolin, MD;  Location: [facility];  Service: Endoscopy;  Laterality: N/A;   BIOPSY  10/12/2015    Procedure: BIOPSY;  Surgeon: Daneil Dolin, MD;  [facility];  Service: Endoscopy;;  stomach bx's   CARDIAC DEFIBRILLATOR PLACEMENT  11/12/12   [employer] Inogen MINI ICD implanted in [place] at [place] AVR per Dr Nelly Laurence note   COLONOSCOPY WITH PROPOFOL N/A 04/10/2014   RMR: Colonic Diverticulosis   ESOPHAGOGASTRODUODENOSCOPY (EGD) WITH PROPOFOL N/A 04/10/2014   RMR: Mild erosive reflux esophagitis. Multiple antral polyps likely hyperplastic status post removal by hot snare cautery technique. Diffusely abnormal stomach status post gastric biopsy I suspect some of patients anemaia may be due to intermittent oozing from the stomach. It would be difficult and a risky proposition to attempt complete removal of all of his gastric polyps.   ESOPHAGOGASTRODUODENOSCOPY (EGD) WITH PROPOFOL N/A 10/12/2015   Procedure: ESOPHAGOGASTRODUODENOSCOPY (EGD) WITH PROPOFOL;  Surgeon: Daneil Dolin, MD;  [facility];  Service: Endoscopy;  Laterality: N/A;  [id number] - moved to [place] Right 07/20/2018   Procedure: EXCHANGE OF DIALYSIS CATHETER RIGHT INTERNAL JUGULAR;  Surgeon: Rosetta Posner, MD;  Location: [place];  Service: Vascular;  Laterality: Right;   IR FLUORO GUIDE CV LINE RIGHT  07/09/2018   IR US GUIDE VASC ACCESS RIGHT  07/09/2018   POLYPECTOMY N/A 04/10/2014   Procedure: GASTRIC POLYPECTOMY;  Surgeon: Daneil Dolin, MD;  Location: [facility];  Service: Endoscopy;  Laterality: N/A;   RIGHT HEART CATHETERIZATION N/A 02/06/2014   Procedure: RIGHT HEART CATH;  Surgeon: Larey Dresser, MD;  Location: [facility] CATH LAB;  Service: Cardiovascular;  Laterality: N/A;    Current Medications: Outpatient Medications Prior to Visit  Medication Sig Dispense Refill   acetaminophen (TYLENOL) 325 MG tablet Take 2 tablets (650 mg total) by mouth every 4 (four) hours as needed for mild pain (or temp > 37.5 C (99.5  F)).     albuterol (PROVENTIL) (2.5 MG/3ML) 0.083% nebulizer solution Take 3 mLs (2.5 mg total) by nebulization every 4 (four) hours as needed for wheezing or shortness of breath. 75 mL 12   atorvastatin (LIPITOR) 80 MG tablet Take 80 mg by mouth daily.      bictegravir-emtricitabine-tenofovir AF (BIKTARVY) 50-200-25 MG TABS tablet Take 1 tablet by mouth daily. 30 tablet 11   calcium carbonate (TUMS EX) 750 MG chewable tablet Chew 1 tablet by mouth 2 (two) times daily.     Cholecalciferol (VITAMIN D3) 2000 UNITS TABS Take 2,000 mg by mouth daily.     coal tar (NEUTROGENA T-GEL) 0.5 % shampoo Apply to scalp on shower days (Twice a week) for dandruff and dry scalp Wed., and Sat.     docusate sodium (COLACE) 100 MG capsule Take 100 mg by mouth daily.     feeding supplement, ENSURE ENLIVE, (ENSURE ENLIVE) LIQD Take 237 mLs by mouth 2 (  two) times daily between meals. 237 mL 12   insulin aspart (NOVOLOG) 100 UNIT/ML injection 0-9 Units, Subcutaneous, 3 times daily with meals CBG < 70: implement hypoglycemia protocol CBG 70 - 120: 0 units CBG 121 - 150: 1 unit CBG 151 - 200: 2 units CBG 201 - 250: 3 units CBG 251 - 300: 5 units CBG 301 - 350: 7 units CBG 351 - 400: 9 units CBG > 400: call MD 10 mL 11   mometasone-formoterol (DULERA) 100-5 MCG/ACT AERO Inhale 2 puffs into the lungs 2 (two) times daily.     multivitamin (RENA-VIT) TABS tablet Take 1 tablet by mouth at bedtime.  0   NON FORMULARY Diet Type:  Dysphagia 2 diet with Honey Thick Liquids, NAS, Cons CHO     ranitidine (ZANTAC) 150 MG tablet Take 150 mg by mouth at bedtime.     senna-docusate (SENEXON-S) 8.6-50 MG tablet Take 1 tablet by mouth daily.     tamsulosin (FLOMAX) 0.4 MG CAPS capsule Take 0.4 mg by mouth daily.     warfarin (COUMADIN) 6 MG tablet Take 6 mg by mouth at bedtime.     carvedilol (COREG) 3.125 MG tablet Take 3.125 mg by mouth 2 (two) times daily with a meal.     digoxin (LANOXIN) 0.125 MG tablet Take  0.0625 mg by mouth daily.     metoprolol succinate (TOPROL-XL) 25 MG 24 hr tablet Take 1 tablet (25 mg total) by mouth daily.     No facility-administered medications prior to visit.      Allergies:   Patient has no known allergies.   Social History   Socioeconomic History   Marital status: Single    Spouse name: Not on file   Number of children: Not on file   Years of education: Not on file   Highest education level: Not on file  Occupational History   Occupation: disabled  Social Designer, fashion/clothing strain: Not hard at all   Food insecurity:    Worry: Never true    Inability: Never true   Transportation needs:    Medical: No    Non-medical: No  Tobacco Use   Smoking status: Former Smoker    Types: Cigarettes    Last attempt to quit: 11/05/1993    Years since quitting: 24.7   Smokeless tobacco: Never Used  Substance and Sexual Activity   Alcohol use: Not Currently    Comment: former heavy ETOH, none recently   Drug use: No    Comment: prior history of cocaine use, smoking    Sexual activity: Yes    Birth control/protection: None  Lifestyle   Physical activity:    Days per week: 4 days    Minutes per session: 20 min   Stress: Not at all  Relationships   Social connections:    Talks on phone: Never    Gets together: Once a week    Attends religious service: Never    Active member of club or organization: No    Attends meetings of clubs or organizations: Never    Relationship status: Never married  Other Topics Concern   Not on file  Social History Narrative   Recently moved from [place] to be with family in [place] (2015).  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  Has h/o polysubstance abuse.  Baseline - able to ambulate, mild cognitive delay, able to toilet and shower himself, occasional use of walker/cane.   2019-  [facility] of [facility]. Unable to ambulate unassisted.              Family History:   The patient's family history includes Kidney disease in his mother and sister.   Review of Systems:   Please see the history of present illness.     General:  No chills, fever, night sweats or weight changes.  Cardiovascular:  No chest pain, dyspnea on exertion, edema, orthopnea, palpitations, paroxysmal nocturnal dyspnea. Dermatological: No rash, lesions/masses Respiratory: No cough, dyspnea Urologic: No hematuria, dysuria Abdominal:   No nausea, vomiting, diarrhea, bright red blood per rectum, melena, or hematemesis Neurologic:  No visual changes. Positive for weakness and changes in mental status.  All other systems reviewed and are otherwise negative except as noted above.   Physical Exam:    VS:  BP 102/60    Pulse 85    Wt 154 lb 3.2 oz (69.9 kg)    SpO2 98%    BMI 27.32 kg/m    General: Well developed, elderly African American male appearing in no acute distress. Head: Normocephalic, atraumatic, sclera non-icteric, no xanthomas, nares are without discharge.  Neck: No carotid bruits. JVD not elevated.  Lungs: Respirations regular and unlabored, without wheezes or rales.  Heart: Regular rate and rhythm. No S3 or S4.  No rubs or gallops appreciated. 2/6 SEM along RUSB.  Abdomen: Soft, non-tender, non-distended with normoactive bowel sounds. No hepatomegaly. No rebound/guarding. No obvious abdominal masses. Msk:  Strength and tone appear normal for age. No joint deformities or effusions. Extremities: No clubbing or cyanosis. No lower extremity edema.  Distal pedal pulses are 2+ bilaterally. Neuro: Alert and oriented X 3. Moves all extremities spontaneously. No focal deficits noted. Psych:  Responds to questions appropriately with a normal affect. Skin: No rashes or lesions noted  Wt Readings from Last 3 Encounters:  08/08/18 154 lb 3.2 oz (69.9 kg)  08/06/18 154 lb 3.2 oz (69.9 kg)  08/03/18 154 lb 3.2 oz (69.9 kg)     Studies/Labs Reviewed:   EKG:  EKG is not ordered  today.   Recent Labs: 07/06/2018: B Natriuretic Peptide 554.5 07/09/2018: TSH 3.098 07/16/2018: ALT 36 07/18/2018: Magnesium 1.9 07/30/2018: BUN 93; Creatinine, Ser 7.47; Hemoglobin 10.7; Platelets 94; Potassium 4.2; Sodium 135   Lipid Panel    Component Value Date/Time   CHOL 100 05/22/2018 0700   TRIG 170 (H) 05/22/2018 0700   HDL 23 (L) 05/22/2018 0700   CHOLHDL 4.3 05/22/2018 0700   VLDL 34 05/22/2018 0700   LDLCALC 43 05/22/2018 0700    Additional studies/ records that were reviewed today include:   Echocardiogram: 06/2018 IMPRESSIONS   1. The left ventricle has severely reduced systolic function, with an ejection fraction of 20-25%. The cavity size was mildly dilated. There is mildly increased left ventricular wall thickness. Left ventricular diastolic Doppler parameters are  consistent with impaired relaxation Left ventricular diffuse hypokinesis.  2. The right ventricle has normal systolic function. The cavity was normal. There is no increase in right ventricular wall thickness.  3. Left atrial size was mildly dilated.  4. The mitral valve is normal in structure. There is mild thickening.  5. The tricuspid valve is normal in structure.  6. AVR.  7. The pulmonic valve was normal in structure.  8. Severe global reduction in LV systolic function; mild LVH; mild LVE; mild LAE; s/p AVR (not well visualized; mean gradient 12 mmHg; AVA 1.5 cm2 but may be underestimated); no vegetations but cannot  R/O with this study; suggest TEE if clinically  indicated.  Assessment:    1. Chronic combined systolic and diastolic heart failure ([place])   2. NICM (nonischemic cardiomyopathy) ([place])   3. Atrial flutter, paroxysmal ([place])   4. S/P AVR (aortic valve replacement)   5. Essential hypertension   6. ESRD (end stage renal disease) ([place])      Plan:   In order of problems listed above:  1. Chronic Combined Systolic and Diastolic CHF/ NICM - The patient has an EF that was previously down  to 15 to 20% in 2014 with cardiac catheterization showing no significant CAD. EF slightly improved to 20 to 25% by repeat echo last month. He is s/p ICD placement in 2014 which is followed by Dr. Lovena Le and this fired last week with his VF zone being increased by EP at that time. Denies any recent chest pain or dyspnea on exertion. Now wheel-chair bound at baseline and resides at [facility].  - Volume status is now being managed by hemodialysis. Would continue Toprol-XL 25 mg daily in the setting of his cardiomyopathy. Unable to add BiDil given his low-normal BP. Not on ACE-I/ARB/ARNI in the setting of ESRD.  2. Paroxysmal Atrial Flutter - Was diagnosed during his recent admission for sepsis in the setting of E. coli bacteremia. He spontaneously converted back to normal sinus rhythm. Is maintaining normal sinus rhythm by examination today. Will continue on Toprol-XL 25 mg daily for rate control. - He was started on Coumadin for anticoagulation during his hospitalization and INR was at 2.0 when checked at SNF on 08/07/2018. He denies any evidence of active bleeding. Would continue to follow hemoglobin closely in the setting of his chronic anemia.  3. Aortic Valve Replacement - He is status post bioprosthetic AVR. Recent echocardiogram in 06/2018 showed a mean gradient of 12 with AVA at 1.5 cm.  4. HTN - BP is soft at 102/60 during today's visit. Continue Toprol-XL 25mg  daily.   5. ESRD - he has been started on HD and attends sessions on Tuesdays, Thursdays, and Saturdays. Followed by Dr. Lowanda Foster.    Medication Adjustments/Labs and Tests Ordered: Current medicines are reviewed at length with the patient today.  Concerns regarding medicines are outlined above.  Medication changes, Labs and Tests ordered today are listed in the Patient Instructions below. Patient Instructions  Medication Instructions:  Continue Toprol XL 25 mg daily  If you need a refill on your cardiac medications before your next  appointment, please call your pharmacy.   Lab work: NONE  If you have labs (blood work) drawn today and your tests are completely normal, you will receive your results only by:  [place] (if you have MyChart) OR  A paper copy in the mail If you have any lab test that is abnormal or we need to change your treatment, we will call you to review the results.  Testing/Procedures: NONE   Follow-Up: At [facility], you and your health needs are our priority.  As part of our continuing mission to provide you with exceptional heart care, we have created designated Provider Care Teams.  These Care Teams include your primary Cardiologist (physician) and Advanced Practice Providers (APPs -  Physician Assistants and Nurse Practitioners) who all work together to provide you with the care you need, when you need it. You will need a follow up appointment in 2-3 months.  Please call our office 2 months in advance to schedule this appointment.  You may see Carlyle Dolly,  MD or one of the following Advanced Practice Providers on your designated Care Team:   [place], PA-C ([place])  Ermalinda Barrios, PA-C [facility])  Any Other Special Instructions Will Be Listed Below (If Applicable). Thank you for choosing [facility]!    Signed, Erma Heritage, PA-C  08/08/2018 5:16 PM    [place] [address] [place], [place] [address] Phone: [phone] Fax: [phone]

## 2018-08-08 NOTE — Patient Instructions (Signed)
Medication Instructions:  Continue Toprol XL 25 mg daily  If you need a refill on your cardiac medications before your next appointment, please call your pharmacy.   Lab work: NONE  If you have labs (blood work) drawn today and your tests are completely normal, you will receive your results only by: [place] Kitchen MyChart Message (if you have MyChart) OR . A paper copy in the mail If you have any lab test that is abnormal or we need to change your treatment, we will call you to review the results.  Testing/Procedures: NONE   Follow-Up: At [facility], you and your health needs are our priority.  As part of our continuing mission to provide you with exceptional heart care, we have created designated Provider Care Teams.  These Care Teams include your primary Cardiologist (physician) and Advanced Practice Providers (APPs -  Physician Assistants and Nurse Practitioners) who all work together to provide you with the care you need, when you need it. You will need a follow up appointment in 2-3 months.  Please call our office 2 months in advance to schedule this appointment.  You may see Carlyle Dolly, MD or one of the following Advanced Practice Providers on your designated Care Team:   Bernerd Pho, PA-C [facility]) . Ermalinda Barrios, PA-C ([place])  Any Other Special Instructions Will Be Listed Below (If Applicable). Thank you for choosing [facility]!

## 2018-08-09 ENCOUNTER — Encounter: Payer: Self-pay | Admitting: Cardiology

## 2018-08-09 LAB — FUNGUS CULTURE RESULT

## 2018-08-09 LAB — FUNGUS CULTURE WITH STAIN

## 2018-08-09 LAB — FUNGAL ORGANISM REFLEX

## 2018-08-09 NOTE — Progress Notes (Signed)
Remote ICD transmission.   

## 2018-08-10 ENCOUNTER — Non-Acute Institutional Stay (SKILLED_NURSING_FACILITY): Payer: Medicare HMO | Admitting: Adult Health

## 2018-08-10 ENCOUNTER — Encounter: Payer: Self-pay | Admitting: Adult Health

## 2018-08-10 DIAGNOSIS — Z992 Dependence on renal dialysis: Secondary | ICD-10-CM

## 2018-08-10 DIAGNOSIS — N186 End stage renal disease: Secondary | ICD-10-CM

## 2018-08-10 DIAGNOSIS — I120 Hypertensive chronic kidney disease with stage 5 chronic kidney disease or end stage renal disease: Secondary | ICD-10-CM | POA: Diagnosis not present

## 2018-08-10 DIAGNOSIS — E1122 Type 2 diabetes mellitus with diabetic chronic kidney disease: Secondary | ICD-10-CM

## 2018-08-10 DIAGNOSIS — E785 Hyperlipidemia, unspecified: Secondary | ICD-10-CM | POA: Diagnosis not present

## 2018-08-10 DIAGNOSIS — E1169 Type 2 diabetes mellitus with other specified complication: Secondary | ICD-10-CM

## 2018-08-10 NOTE — Progress Notes (Signed)
Location:    [facility] Room Number: 148/D Place of Service:  SNF (31)   CODE STATUS: DNR  No Known Allergies  Chief Complaint  Patient presents with  . Medical Management of Chronic Issues    Type 2 diabetes mellitus with hypertension and end stage renal disease on dialysis; dyslipidemia associated with type 2 diabetes mellitus; chronic kidney disease on dialysis due to type 2 diabetes mellitus. Weekly follow up for the first 30 days post hospitalization     HPI:  He is a 68 year old long term resident of this facility being seen for the management of his chronic illnesses: diabetes; dyslipidemia; esrd. There are no reports of changes in appetite; no uncontrolled pain; no anxiety or agitation.   Past Medical History:  Diagnosis Date  . AICD (automatic cardioverter/defibrillator) present   . Alcoholism in remission ([place])   . Anemia   . Aortic insufficiency   . Arthritis   . At moderate risk for fall   . Bell's palsy   . Chronic systolic heart failure (HCC)    a. EF previously 15-20% with cath in 2014 showing no significant CAD b. EF 20-25% by echo in 06/2018  . CKD (chronic kidney disease), stage IV ([place])   . COPD (chronic obstructive pulmonary disease) ([place])   . Essential hypertension, benign   . HIV disease ([place]) 09/06/2016  . Hyperlipidemia   . NICM (nonischemic cardiomyopathy) ([place])   . Noncompliance with medication regimen   . NSVT (nonsustained ventricular tachycardia) ([place])   . Numbness of right jaw    Had a stoke in 01/2013. Numbness is occasional, especially when trying to chew.  . OSA (obstructive sleep apnea)   . Productive cough 08/2013   With brown sputum   . S/P aortic valve replacement with bioprosthetic valve   . Stroke ([place]) 01/2013   weakness of right side from CVA  . Type 2 diabetes mellitus ([place])   . Uses hearing aid 2014   recently received new hearing aids    Past Surgical History:  Procedure Laterality Date  . AV FISTULA  PLACEMENT Left 07/20/2018   Procedure: INSERTION ARTERIOVENOUS GORTEX GRAFT  LEFT ARM;  Surgeon: Rosetta Posner, MD;  Location: [facility];  Service: Vascular;  Laterality: Left;  . BIOPSY N/A 04/10/2014   Procedure: GASTRIC BIOPSY;  Surgeon: Daneil Dolin, MD;  Location: [facility];  Service: Endoscopy;  Laterality: N/A;  . BIOPSY  10/12/2015   Procedure: BIOPSY;  Surgeon: Daneil Dolin, MD;  [facility];  Service: Endoscopy;;  stomach bx's  . CARDIAC DEFIBRILLATOR PLACEMENT  11/12/12   [employer] Inogen MINI ICD implanted in [place] at [facility] per Dr Nelly Laurence note  . COLONOSCOPY WITH PROPOFOL N/A 04/10/2014   RMR: Colonic Diverticulosis  . ESOPHAGOGASTRODUODENOSCOPY (EGD) WITH PROPOFOL N/A 04/10/2014   RMR: Mild erosive reflux esophagitis. Multiple antral polyps likely hyperplastic status post removal by hot snare cautery technique. Diffusely abnormal stomach status post gastric biopsy I suspect some of patients anemaia may be due to intermittent oozing from the stomach. It would be difficult and a risky proposition to attempt complete removal of all of his gastric polyps.  . ESOPHAGOGASTRODUODENOSCOPY (EGD) WITH PROPOFOL N/A 10/12/2015   Procedure: ESOPHAGOGASTRODUODENOSCOPY (EGD) WITH PROPOFOL;  Surgeon: Daneil Dolin, MD;  [facility];  Service: Endoscopy;  Laterality: N/A;  1607 - moved to 9:15  . INSERTION OF  DIALYSIS CATHETER Right 07/20/2018   Procedure: EXCHANGE OF DIALYSIS CATHETER RIGHT INTERNAL JUGULAR;  Surgeon: Rosetta Posner, MD;  Location: [facility];  Service: Vascular;  Laterality: Right;  . IR FLUORO GUIDE CV LINE RIGHT  07/09/2018  . IR US GUIDE VASC ACCESS RIGHT  07/09/2018  . POLYPECTOMY N/A 04/10/2014   Procedure: GASTRIC POLYPECTOMY;  Surgeon: Daneil Dolin, MD;  Location: [facility];  Service: Endoscopy;  Laterality: N/A;  . RIGHT HEART CATHETERIZATION N/A 02/06/2014   Procedure: RIGHT HEART CATH;   Surgeon: Larey Dresser, MD;  Location: [facility] CATH LAB;  Service: Cardiovascular;  Laterality: N/A;    Social History   Socioeconomic History  . Marital status: Single    Spouse name: Not on file  . Number of children: Not on file  . Years of education: Not on file  . Highest education level: Not on file  Occupational History  . Occupation: disabled  Social Needs  . Financial resource strain: Not hard at all  . Food insecurity:    Worry: Never true    Inability: Never true  . Transportation needs:    Medical: No    Non-medical: No  Tobacco Use  . Smoking status: Former Smoker    Types: Cigarettes    Last attempt to quit: 11/05/1993    Years since quitting: 24.7  . Smokeless tobacco: Never Used  Substance and Sexual Activity  . Alcohol use: Not Currently    Comment: former heavy ETOH, none recently  . Drug use: No    Comment: prior history of cocaine use, smoking   . Sexual activity: Yes    Birth control/protection: None  Lifestyle  . Physical activity:    Days per week: 4 days    Minutes per session: 20 min  . Stress: Not at all  Relationships  . Social connections:    Talks on phone: Never    Gets together: Once a week    Attends religious service: Never    Active member of club or organization: No    Attends meetings of clubs or organizations: Never    Relationship status: Never married  . Intimate partner violence:    Fear of current or ex partner: No    Emotionally abused: No    Physically abused: No    Forced sexual activity: No  Other Topics Concern  . Not on file  Social History Narrative   Recently moved from [place] to be with family in [place] (2015).  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  Has h/o polysubstance abuse.  Baseline - able to ambulate, mild cognitive delay, able to toilet and shower himself, occasional use of walker/cane.   2019- resident of [facility]. Unable to ambulate unassisted.            Family  History  Problem Relation Age of Onset  . Kidney disease Mother   . Kidney disease Sister   . Colon cancer Neg Hx       VITAL SIGNS BP 132/67   Pulse (!) 59   Temp 98.3 F (36.8 C) (Oral)   Resp 20   Ht 5\' 3"  (1.6 m)   Wt 154 lb 3.2 oz (69.9 kg)   SpO2 96%   BMI 27.32 kg/m   Outpatient Encounter Medications as of 08/10/2018  Medication Sig  . acetaminophen (TYLENOL) 325 MG tablet Take 2 tablets (650 mg total) by mouth every 4 (four) hours as needed for mild  pain (or temp > 37.5 C (99.5 F)).  Marland Kitchen albuterol (PROVENTIL) (2.5 MG/3ML) 0.083% nebulizer solution Take 3 mLs (2.5 mg total) by nebulization every 4 (four) hours as needed for wheezing or shortness of breath.  Marland Kitchen atorvastatin (LIPITOR) 80 MG tablet Take 80 mg by mouth daily.   . bictegravir-emtricitabine-tenofovir AF (BIKTARVY) 50-200-25 MG TABS tablet Take 1 tablet by mouth daily.  . calcium carbonate (TUMS EX) 750 MG chewable tablet Chew 1 tablet by mouth 2 (two) times daily.  . Cholecalciferol (VITAMIN D3) 2000 UNITS TABS Take 2,000 mg by mouth daily.  . coal tar (NEUTROGENA T-GEL) 0.5 % shampoo Apply to scalp on shower days (Twice a week) for dandruff and dry scalp Wed., and Sat.  . docusate sodium (COLACE) 100 MG capsule Take 100 mg by mouth daily.  . feeding supplement, ENSURE ENLIVE, (ENSURE ENLIVE) LIQD Take 237 mLs by mouth 2 (two) times daily between meals.  . metoprolol succinate (TOPROL-XL) 25 MG 24 hr tablet Take 1 tablet (25 mg total) by mouth daily.  . mometasone-formoterol (DULERA) 100-5 MCG/ACT AERO Inhale 2 puffs into the lungs 2 (two) times daily.  . multivitamin (RENA-VIT) TABS tablet Take 1 tablet by mouth at bedtime.  . NON FORMULARY Diet Type:  Dysphagia 2 diet with Honey Thick Liquids, NAS, Cons CHO  . ranitidine (ZANTAC) 150 MG tablet Take 150 mg by mouth at bedtime.  . senna-docusate (SENEXON-S) 8.6-50 MG tablet Take 1 tablet by mouth daily.  . tamsulosin (FLOMAX) 0.4 MG CAPS capsule Take 0.4 mg by  mouth daily.  Marland Kitchen warfarin (COUMADIN) 6 MG tablet Take 6 mg by mouth at bedtime.  . [DISCONTINUED] insulin aspart (NOVOLOG) 100 UNIT/ML injection 0-9 Units, Subcutaneous, 3 times daily with meals CBG < 70: implement hypoglycemia protocol CBG 70 - 120: 0 units CBG 121 - 150: 1 unit CBG 151 - 200: 2 units CBG 201 - 250: 3 units CBG 251 - 300: 5 units CBG 301 - 350: 7 units CBG 351 - 400: 9 units CBG > 400: call MD   No facility-administered encounter medications on file as of 08/10/2018.      SIGNIFICANT DIAGNOSTIC EXAMS   PREVIOUS:   07-06-18: chest x-ray: Enlargement of cardiac silhouette with vascular congestion and question minimal pulmonary edema.  07-06-18: ct of head;  1. New age indeterminate RIGHT basal ganglia small infarcts. 2. Old RIGHT temporal occipital/PCA territory infarct. 3. Old basal ganglia, thalami, pontine and cerebellar small infarcts. Moderate to severe chronic small vessel ischemic changes. 4. Moderate parenchymal brain volume loss, advanced for age.  07-07-18: US renal: Medical renal disease changes of both kidneys. Small renal cysts in both kidneys. No evidence of hydronephrosis.  07-11-18: ct of head:  1. Stable head CT, with no new acute intracranial abnormality. 2. In band cerebral atrophy with chronic small vessel ischemic disease with multiple chronic ischemic infarcts as above. 3. Acute pan sinusitis, worsened from previous.  05-12-19 ct of abdomen and pelvis: Diverticulosis without diverticulitis. Bilateral renal cysts. Mild bibasilar atelectasis.  07-12-18: 2-d echo:   1. The left ventricle has severely reduced systolic function, with an ejection fraction of 20-25%. The cavity size was mildly dilated. There is mildly increased left ventricular wall thickness. Left ventricular diastolic Doppler parameters are  consistent with impaired relaxation Left ventricular diffuse hypokinesis.  2. The right ventricle has normal systolic function. The cavity  was normal. There is no increase in right ventricular wall thickness.  3. Left atrial size was mildly dilated.  4.  The mitral valve is normal in structure. There is mild thickening.  5. The tricuspid valve is normal in structure.  6. AVR.  7. The pulmonic valve was normal in structure.  8. Severe global reduction in LV systolic function; mild LVH; mild LVE; mild LAE; s/p AVR (not well visualized; mean gradient 12 mmHg; AVA 1.5 cm2 but may be underestimated); no vegetations but cannot R/O with this study; suggest TEE if clinically  indicated.  07-20-18: chest x-ray: Right IJ dialysis catheter in good position without complicating features. Cardiac enlargement and vascular congestion without overt pulmonary edema.  NO NEW EXAMS.    LABS REVIEWED PREVIOUS:   05-22-18: hgb a1c 6.5 chol 100 ldl 43; trig 170; hdl 23 07-04-18: wbc  3.7; hgb 14.0; hct 45.6; mcv 91.8; plt 124; glucose 100; bun 45; creat 2.44; k+ 4.7; na++ 139 BNP 679.0  07-06-18: wbc 1.9; hgb 15.2; hct 48.8; mcv 91.2 plt 107; glucose 136; bun 43; creat 3.01; k+ 5.9; na++ 136; ca 8.8 ast 70 alt 46; albumin 3.0 BNP 554.5   blood culture: no growth /e-coli  07-08-18: wbc 13.8; hgb 13.1; hct 41.1; mcv 93.2 plt 73 glucose 108 ;bun 74; creat 5.32; k+ 5.5; na++ 139; ca 7.5; ast 80 alt 117; albumin 2.3  07-09-18; tsh 3.098 07-17-18: wbc 3.1; hgb 1.8; hct 37.3; mcv 89.9 plt 103 glucose 114; bun 95; creat 6.00; k+ 3.5; na++ 139; ca 6.4 mag 2.1 07-27-18: INR 2.3: coumadin 5 mg daily   07-30-18: wbc  6.8; hgb 10.7; hct 33.6; mcv 89.6; plt 94 glucose 90; bun 93; creat 7.47; k+ 4.2; na++ 135; ca 7.1 08-02-18: INR 1.9    NO NEW LABS.    Review of Systems  Unable to perform ROS: Dementia (unable to participate )   Physical Exam Constitutional:      General: He is not in acute distress.    Appearance: He is well-developed. He is not diaphoretic.  Neck:     Musculoskeletal: Neck supple.     Thyroid: No thyromegaly.  Cardiovascular:     Rate and  Rhythm: Normal rate and regular rhythm.     Pulses: Normal pulses.     Heart sounds: Murmur present.     Comments: 2/6 Pulmonary:     Effort: Pulmonary effort is normal. No respiratory distress.     Breath sounds: Normal breath sounds.  Abdominal:     General: Bowel sounds are normal. There is no distension.     Palpations: Abdomen is soft.     Tenderness: There is no abdominal tenderness.  Musculoskeletal:     Right lower leg: No edema.     Left lower leg: No edema.     Comments: Is able to move all extremities   Lymphadenopathy:     Cervical: No cervical adenopathy.  Skin:    General: Skin is warm and dry.     Comments: Right arm a/v fistula +thrill + bruit      Neurological:     Mental Status: He is alert. Mental status is at baseline.      ASSESSMENT/ PLAN:  TODAY:   1. Dyslipidemia associated with type 2 diabetes mellitus is stable ldl 43 will continue lipitor 80 mg daily   2. Type 2 diabetes mellitus with hypertension and end stage renal disease on dialysis: is stable hgb a1c 6.5 will continue novolog SSI 121-150: 1 unit; 151-200: 2 units: 201-250: 3 units; 251-300: 5 units; 301-350: 7 units; 351-400: 9 units.   3. Chronic  kidney disease with end stage renal disease on dialysis due to type 2 diabetes mellitus/dialysis dependent: is without change is being dialyzed three times weekly is follow by nephrology.    PREVIOUS   4. Other emphysema: is stable will continue dulera 100/5 mcg 2 puffs twice daily has albuterol neb every 4 hours as needed   5. HIV disease: is without change: will continue Biktarvy: 20-200-25 mg daily   6. CVA/mulitple CVA : is neurologically stable will not make changes will monitor   7. [place] with obstruction/lower urinary tract symptoms: is stable will continue flomax 0.4 mg daily   8. GERD without esophagitis: is stable will continue zantac 150 mg daily   9. Chronic constipation: is stable will continue senna s daily colace daily   10.  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: is without signs of aspiration present will continue honey thick liquids.   11. Protein calorie malnutrition, severe: is without change albumin 2.3 weight 154 pounds; will continue supplements as directed  12. Chronic combined systolic and diastolic heart failure: ICD in place without change EF 20-25% (07-12-18) will continue toprol xl 25 mg daily   13. Essential hypertension: is stable b/p 121/70 will continue toprol xl 25 mg daily   14. PAF (paroxysmal atrial fibrillation); status post bioprosthetic AVR heart rate stable will continue toprol xl 25 mg daily for rate control is on chronic coumadin therapy managed by Dr. Lyndel Safe        MD is aware of resident's narcotic use and is in agreement with current plan of care. We will attempt to wean resident as apropriate   Ok Edwards NP [facility]  Contact [phone] Monday through Friday 8am- 5pm  After hours call [phone]

## 2018-08-15 ENCOUNTER — Encounter: Payer: Self-pay | Admitting: Internal Medicine

## 2018-08-15 ENCOUNTER — Encounter: Payer: Self-pay | Admitting: Adult Health

## 2018-08-15 ENCOUNTER — Non-Acute Institutional Stay (SKILLED_NURSING_FACILITY): Payer: Medicare HMO | Admitting: Adult Health

## 2018-08-15 ENCOUNTER — Ambulatory Visit (INDEPENDENT_AMBULATORY_CARE_PROVIDER_SITE_OTHER): Payer: Medicare HMO | Admitting: Internal Medicine

## 2018-08-15 DIAGNOSIS — G9340 Encephalopathy, unspecified: Secondary | ICD-10-CM

## 2018-08-15 DIAGNOSIS — E43 Unspecified severe protein-calorie malnutrition: Secondary | ICD-10-CM | POA: Diagnosis not present

## 2018-08-15 DIAGNOSIS — I120 Hypertensive chronic kidney disease with stage 5 chronic kidney disease or end stage renal disease: Secondary | ICD-10-CM | POA: Diagnosis not present

## 2018-08-15 DIAGNOSIS — E1122 Type 2 diabetes mellitus with diabetic chronic kidney disease: Secondary | ICD-10-CM

## 2018-08-15 DIAGNOSIS — Z992 Dependence on renal dialysis: Secondary | ICD-10-CM

## 2018-08-15 DIAGNOSIS — B20 Human immunodeficiency virus [HIV] disease: Secondary | ICD-10-CM

## 2018-08-15 DIAGNOSIS — N186 End stage renal disease: Secondary | ICD-10-CM

## 2018-08-15 DIAGNOSIS — R6521 Severe sepsis with septic shock: Secondary | ICD-10-CM

## 2018-08-15 DIAGNOSIS — A4151 Sepsis due to Escherichia coli [E. coli]: Secondary | ICD-10-CM

## 2018-08-15 NOTE — Progress Notes (Signed)
Location:   [place] Room Number: 148 D Place of Service:  SNF (31)   CODE STATUS: DNR  No Known Allergies  Chief Complaint  Patient presents with  . Acute Visit    Care Plan Meeting    HPI:  We have come together for his care plan meeting. He is new to dialysis is on a fluid restriction. He is more alert but is unable to participate in his own medical care. There are no reports of fevers present. There are no reports of uncontrolled pain; no changes in appetite; no agitation present. His weight is stable.   Past Medical History:  Diagnosis Date  . AICD (automatic cardioverter/defibrillator) present   . Alcoholism in remission ([place])   . Anemia   . Aortic insufficiency   . Arthritis   . At moderate risk for fall   . Bell's palsy   . Chronic systolic heart failure (HCC)    a. EF previously 15-20% with cath in 2014 showing no significant CAD b. EF 20-25% by echo in 06/2018  . CKD (chronic kidney disease), stage IV ([place])   . COPD (chronic obstructive pulmonary disease) ([place])   . Essential hypertension, benign   . HIV disease ([place]) 09/06/2016  . Hyperlipidemia   . NICM (nonischemic cardiomyopathy) ([place])   . Noncompliance with medication regimen   . NSVT (nonsustained ventricular tachycardia) ([place])   . Numbness of right jaw    Had a stoke in 01/2013. Numbness is occasional, especially when trying to chew.  . OSA (obstructive sleep apnea)   . Productive cough 08/2013   With brown sputum   . S/P aortic valve replacement with bioprosthetic valve   . Stroke ([place]) 01/2013   weakness of right side from CVA  . Type 2 diabetes mellitus ([place])   . Uses hearing aid 2014   recently received new hearing aids    Past Surgical History:  Procedure Laterality Date  . AV FISTULA PLACEMENT Left 07/20/2018   Procedure: INSERTION ARTERIOVENOUS GORTEX GRAFT  LEFT ARM;  Surgeon: Rosetta Posner, MD;  Location: [facility];  Service: Vascular;  Laterality: Left;  . BIOPSY  N/A 04/10/2014   Procedure: GASTRIC BIOPSY;  Surgeon: Daneil Dolin, MD;  Location: [facility];  Service: Endoscopy;  Laterality: N/A;  . BIOPSY  10/12/2015   Procedure: BIOPSY;  Surgeon: Daneil Dolin, MD;  [facility];  Service: Endoscopy;;  stomach bx's  . CARDIAC DEFIBRILLATOR PLACEMENT  11/12/12   [employer] Inogen MINI ICD implanted in [place] at [facility] AVR per Dr Nelly Laurence note  . COLONOSCOPY WITH PROPOFOL N/A 04/10/2014   RMR: Colonic Diverticulosis  . ESOPHAGOGASTRODUODENOSCOPY (EGD) WITH PROPOFOL N/A 04/10/2014   RMR: Mild erosive reflux esophagitis. Multiple antral polyps likely hyperplastic status post removal by hot snare cautery technique. Diffusely abnormal stomach status post gastric biopsy I suspect some of patients anemaia may be due to intermittent oozing from the stomach. It would be difficult and a risky proposition to attempt complete removal of all of his gastric polyps.  . ESOPHAGOGASTRODUODENOSCOPY (EGD) WITH PROPOFOL N/A 10/12/2015   Procedure: ESOPHAGOGASTRODUODENOSCOPY (EGD) WITH PROPOFOL;  Surgeon: Daneil Dolin, MD;  [facility];  Service: Endoscopy;  Laterality: N/A;  0623 - moved to 9:15  . INSERTION OF DIALYSIS CATHETER Right 07/20/2018   Procedure: EXCHANGE OF DIALYSIS CATHETER RIGHT INTERNAL JUGULAR;  Surgeon: Rosetta Posner, MD;  Location:  [facility] OR;  Service: Vascular;  Laterality: Right;  . IR FLUORO GUIDE CV LINE RIGHT  07/09/2018  . IR US GUIDE VASC ACCESS RIGHT  07/09/2018  . POLYPECTOMY N/A 04/10/2014   Procedure: GASTRIC POLYPECTOMY;  Surgeon: Daneil Dolin, MD;  Location: [facility];  Service: Endoscopy;  Laterality: N/A;  . RIGHT HEART CATHETERIZATION N/A 02/06/2014   Procedure: RIGHT HEART CATH;  Surgeon: Larey Dresser, MD;  Location: [facility] CATH LAB;  Service: Cardiovascular;  Laterality: N/A;    Social History   Socioeconomic History  . Marital status: Single    Spouse name:  Not on file  . Number of children: Not on file  . Years of education: Not on file  . Highest education level: Not on file  Occupational History  . Occupation: disabled  Social Needs  . Financial resource strain: Not hard at all  . Food insecurity:    Worry: Never true    Inability: Never true  . Transportation needs:    Medical: No    Non-medical: No  Tobacco Use  . Smoking status: Former Smoker    Types: Cigarettes    Last attempt to quit: 11/05/1993    Years since quitting: 24.7  . Smokeless tobacco: Never Used  Substance and Sexual Activity  . Alcohol use: Not Currently    Comment: former heavy ETOH, none recently  . Drug use: No    Comment: prior history of cocaine use, smoking   . Sexual activity: Yes    Birth control/protection: None  Lifestyle  . Physical activity:    Days per week: 4 days    Minutes per session: 20 min  . Stress: Not at all  Relationships  . Social connections:    Talks on phone: Never    Gets together: Once a week    Attends religious service: Never    Active member of club or organization: No    Attends meetings of clubs or organizations: Never    Relationship status: Never married  . Intimate partner violence:    Fear of current or ex partner: No    Emotionally abused: No    Physically abused: No    Forced sexual activity: No  Other Topics Concern  . Not on file  Social History Narrative   Recently moved from [place] to be with family in [place] (2015).  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  Has h/o polysubstance abuse.  Baseline - able to ambulate, mild cognitive delay, able to toilet and shower himself, occasional use of walker/cane.   2019- resident of [facility]. Unable to ambulate unassisted.            Family History  Problem Relation Age of Onset  . Kidney disease Mother   . Kidney disease Sister   . Colon cancer Neg Hx       VITAL SIGNS BP 140/72   Pulse 95   Temp 99.2 F (37.3 C)    Resp 20   Ht 5\' 3"  (1.6 m)   Wt 149 lb 3.2 oz (67.7 kg)   BMI 26.43 kg/m   Outpatient Encounter Medications as of 08/15/2018  Medication Sig  . acetaminophen (TYLENOL) 325 MG tablet Take 2 tablets (650 mg total) by mouth every 4 (four) hours as needed for mild pain (or temp > 37.5 C (99.5 F)).  Marland Kitchen albuterol (PROVENTIL) (2.5 MG/3ML) 0.083% nebulizer solution Take 3 mLs (2.5 mg total) by nebulization every 4 (four)  hours as needed for wheezing or shortness of breath.  Marland Kitchen atorvastatin (LIPITOR) 80 MG tablet Take 80 mg by mouth daily.   . bictegravir-emtricitabine-tenofovir AF (BIKTARVY) 50-200-25 MG TABS tablet Take 1 tablet by mouth daily.  . calcium carbonate (TUMS EX) 750 MG chewable tablet Chew 1 tablet by mouth 2 (two) times daily.  . Cholecalciferol (VITAMIN D3) 2000 UNITS TABS Take 2,000 mg by mouth daily.  . coal tar (NEUTROGENA T-GEL) 0.5 % shampoo Apply to scalp on shower days (Twice a week) for dandruff and dry scalp Wed., and Sat.  . docusate sodium (COLACE) 100 MG capsule Take 100 mg by mouth daily.  . feeding supplement, ENSURE ENLIVE, (ENSURE ENLIVE) LIQD Take 237 mLs by mouth 2 (two) times daily between meals.  . metoprolol succinate (TOPROL-XL) 25 MG 24 hr tablet Take 1 tablet (25 mg total) by mouth daily.  . mometasone-formoterol (DULERA) 100-5 MCG/ACT AERO Inhale 2 puffs into the lungs 2 (two) times daily.  . multivitamin (RENA-VIT) TABS tablet Take 1 tablet by mouth at bedtime.  . NON FORMULARY Diet Type:  Dysphagia 2 diet with Honey Thick Liquids, NAS, Cons CHO  . ranitidine (ZANTAC) 150 MG tablet Take 150 mg by mouth at bedtime.  . senna-docusate (SENEXON-S) 8.6-50 MG tablet Take 1 tablet by mouth daily.  . tamsulosin (FLOMAX) 0.4 MG CAPS capsule Take 0.4 mg by mouth daily.  Marland Kitchen warfarin (COUMADIN) 5 MG tablet Take 5 mg by mouth at bedtime.    No facility-administered encounter medications on file as of 08/15/2018.      SIGNIFICANT DIAGNOSTIC EXAMS   PREVIOUS:    07-06-18: chest x-ray: Enlargement of cardiac silhouette with vascular congestion and question minimal pulmonary edema.  07-06-18: ct of head;  1. New age indeterminate RIGHT basal ganglia small infarcts. 2. Old RIGHT temporal occipital/PCA territory infarct. 3. Old basal ganglia, thalami, pontine and cerebellar small infarcts. Moderate to severe chronic small vessel ischemic changes. 4. Moderate parenchymal brain volume loss, advanced for age.  07-07-18: US renal: Medical renal disease changes of both kidneys. Small renal cysts in both kidneys. No evidence of hydronephrosis.  07-11-18: ct of head:  1. Stable head CT, with no new acute intracranial abnormality. 2. In band cerebral atrophy with chronic small vessel ischemic disease with multiple chronic ischemic infarcts as above. 3. Acute pan sinusitis, worsened from previous.  05-12-19 ct of abdomen and pelvis: Diverticulosis without diverticulitis. Bilateral renal cysts. Mild bibasilar atelectasis.  07-12-18: 2-d echo:   1. The left ventricle has severely reduced systolic function, with an ejection fraction of 20-25%. The cavity size was mildly dilated. There is mildly increased left ventricular wall thickness. Left ventricular diastolic Doppler parameters are  consistent with impaired relaxation Left ventricular diffuse hypokinesis.  2. The right ventricle has normal systolic function. The cavity was normal. There is no increase in right ventricular wall thickness.  3. Left atrial size was mildly dilated.  4. The mitral valve is normal in structure. There is mild thickening.  5. The tricuspid valve is normal in structure.  6. AVR.  7. The pulmonic valve was normal in structure.  8. Severe global reduction in LV systolic function; mild LVH; mild LVE; mild LAE; s/p AVR (not well visualized; mean gradient 12 mmHg; AVA 1.5 cm2 but may be underestimated); no vegetations but cannot R/O with this study; suggest TEE if clinically  indicated.   07-20-18: chest x-ray: Right IJ dialysis catheter in good position without complicating features. Cardiac enlargement and vascular congestion without  overt pulmonary edema.  NO NEW EXAMS.    LABS REVIEWED PREVIOUS:   05-22-18: hgb a1c 6.5 chol 100 ldl 43; trig 170; hdl 23 07-04-18: wbc  3.7; hgb 14.0; hct 45.6; mcv 91.8; plt 124; glucose 100; bun 45; creat 2.44; k+ 4.7; na++ 139 BNP 679.0  07-06-18: wbc 1.9; hgb 15.2; hct 48.8; mcv 91.2 plt 107; glucose 136; bun 43; creat 3.01; k+ 5.9; na++ 136; ca 8.8 ast 70 alt 46; albumin 3.0 BNP 554.5   blood culture: no growth /e-coli  07-08-18: wbc 13.8; hgb 13.1; hct 41.1; mcv 93.2 plt 73 glucose 108 ;bun 74; creat 5.32; k+ 5.5; na++ 139; ca 7.5; ast 80 alt 117; albumin 2.3  07-09-18; tsh 3.098 07-17-18: wbc 3.1; hgb 1.8; hct 37.3; mcv 89.9 plt 103 glucose 114; bun 95; creat 6.00; k+ 3.5; na++ 139; ca 6.4 mag 2.1 07-27-18: INR 2.3: coumadin 5 mg daily   07-30-18: wbc  6.8; hgb 10.7; hct 33.6; mcv 89.6; plt 94 glucose 90; bun 93; creat 7.47; k+ 4.2; na++ 135; ca 7.1 08-02-18: INR 1.9    NO NEW LABS.   Review of Systems  Unable to perform ROS: Dementia (unable to participate )    Physical Exam Constitutional:      General: He is not in acute distress.    Appearance: He is well-developed. He is not diaphoretic.  Neck:     Musculoskeletal: Neck supple.     Thyroid: No thyromegaly.  Cardiovascular:     Rate and Rhythm: Normal rate and regular rhythm.     Pulses: Normal pulses.     Heart sounds: Murmur present.     Comments: 2/6 Pulmonary:     Effort: Pulmonary effort is normal. No respiratory distress.     Breath sounds: Normal breath sounds.  Abdominal:     General: Bowel sounds are normal. There is no distension.     Palpations: Abdomen is soft.     Tenderness: There is no abdominal tenderness.  Musculoskeletal:     Right lower leg: No edema.     Left lower leg: No edema.     Comments: Is able to move all extremities   Lymphadenopathy:      Cervical: No cervical adenopathy.  Skin:    General: Skin is warm and dry.     Comments: Right arm a/v fistula +thrill + bruit    Neurological:     Mental Status: He is alert. Mental status is at baseline. He is disoriented.  Psychiatric:        Mood and Affect: Mood normal.       ASSESSMENT/ PLAN:  TODAY:   1. Type 2 diabetes mellitus with hypertension and end stage renal disease on dialysis 2. Chronic kidney disease with end stage renal disease on dialysis due to type 2 diabetes mellitus.  3. Protein calorie malnutrition severe    Will continue his current medication regimen Will continue his current plan of care Will continue fluid restriction  Will monitor his status.     MD is aware of resident's narcotic use and is in agreement with current plan of care. We will attempt to wean resident as apropriate   Ok Edwards NP [facility]  Contact [phone] Monday through Friday 8am- 5pm  After hours call [phone]

## 2018-08-15 NOTE — Progress Notes (Signed)
Patient ID: TRAVONTA GILL, male   DOB: 1951-03-30, 68 y.o.   MRN: [id number]   Subjective:    Patient ID: OBIE KALLENBACH, male    DOB: 1950/07/30, 68 y.o.   MRN: [id number]  HPI Here for follow up of HIV  He denies any missed doses and medicaiton is provided to him by the [facility].  He does not have any complaints.  He was recently hospitalized with E coli bacteremia and resulted in renal failure and is now on dialysis.  He continues on [place].    Review of Systems  Constitutional: Negative for fever.  HENT: Negative for trouble swallowing.   Gastrointestinal: Negative for diarrhea.  Skin: Negative for rash.       Objective:   Physical Exam  Constitutional:  Healthier appearing  HENT:  Mouth/Throat: No oropharyngeal exudate.  Eyes: No scleral icterus.  Cardiovascular: Normal rate, regular rhythm and normal heart sounds.  Pulmonary/Chest: Effort normal and breath sounds normal. No respiratory distress.  Lymphadenopathy:    He has no cervical adenopathy.  Skin: No rash noted.   SH: lives at the [place] [facility]:

## 2018-08-16 ENCOUNTER — Encounter (HOSPITAL_COMMUNITY)
Admission: RE | Admit: 2018-08-16 | Discharge: 2018-08-16 | Disposition: A | Discharge status: Home / Self Care | Payer: Medicare HMO | Source: Skilled Nursing Facility | Attending: Internal Medicine | Admitting: Internal Medicine

## 2018-08-16 DIAGNOSIS — A419 Sepsis, unspecified organism: Secondary | ICD-10-CM | POA: Insufficient documentation

## 2018-08-16 DIAGNOSIS — B20 Human immunodeficiency virus [HIV] disease: Secondary | ICD-10-CM | POA: Insufficient documentation

## 2018-08-16 DIAGNOSIS — N186 End stage renal disease: Secondary | ICD-10-CM | POA: Insufficient documentation

## 2018-08-16 DIAGNOSIS — I132 Hypertensive heart and chronic kidney disease with heart failure and with stage 5 chronic kidney disease, or end stage renal disease: Secondary | ICD-10-CM | POA: Insufficient documentation

## 2018-08-16 LAB — PROTIME-INR
INR: 3.2 — ABNORMAL HIGH (ref 0.8–1.2)
Prothrombin Time: 32.3 seconds — ABNORMAL HIGH (ref 11.4–15.2)

## 2018-08-16 LAB — T-HELPER CELL (CD4) - (RCID CLINIC ONLY)
CD4 % Helper T Cell: 50 % (ref 33–55)
CD4 T Cell Abs: 360 /uL — ABNORMAL LOW (ref 400–2700)

## 2018-08-16 NOTE — Assessment & Plan Note (Signed)
Resolved now, no current symptoms.

## 2018-08-16 NOTE — Assessment & Plan Note (Signed)
New ESRD.  No medication changes needed

## 2018-08-16 NOTE — Assessment & Plan Note (Signed)
Doing well on Biktarvy and no changes.

## 2018-08-17 ENCOUNTER — Non-Acute Institutional Stay (SKILLED_NURSING_FACILITY): Payer: Medicare HMO | Admitting: Adult Health

## 2018-08-17 ENCOUNTER — Encounter: Payer: Self-pay | Admitting: Adult Health

## 2018-08-17 DIAGNOSIS — B20 Human immunodeficiency virus [HIV] disease: Secondary | ICD-10-CM

## 2018-08-17 DIAGNOSIS — J438 Other emphysema: Secondary | ICD-10-CM

## 2018-08-17 DIAGNOSIS — I639 Cerebral infarction, unspecified: Secondary | ICD-10-CM

## 2018-08-17 LAB — HIV-1 RNA QUANT-NO REFLEX-BLD
HIV 1 RNA Quant: <20 copies/mL — AB
HIV-1 RNA Quant, Log: <1.3 Log copies/mL — AB

## 2018-08-17 NOTE — Progress Notes (Signed)
Location:   [facility] Room Number: 148 D Place of Service:  SNF (31)   CODE STATUS: DNR  No Known Allergies  Chief Complaint  Patient presents with  . Medical Management of Chronic Issues    Cerebrovascular accident unspecified mechanism; other emphysema; HIV disease. Weekly follow up for the first 30 days post hospitalization.     HPI:  He is a long term resident of this facility being seen for the management of her chronic illnesses: cva copd; cva. There are no reports of uncontrolled pain; no changes in appetite; no agitation no anxiety. There are no reports of fevers present.   Past Medical History:  Diagnosis Date  . AICD (automatic cardioverter/defibrillator) present   . Alcoholism in remission ([place])   . Anemia   . Aortic insufficiency   . Arthritis   . At moderate risk for fall   . Bell's palsy   . Chronic systolic heart failure (HCC)    a. EF previously 15-20% with cath in 2014 showing no significant CAD b. EF 20-25% by echo in 06/2018  . CKD (chronic kidney disease), stage IV ([place])   . COPD (chronic obstructive pulmonary disease) ([place])   . Essential hypertension, benign   . HIV disease ([place]) 09/06/2016  . Hyperlipidemia   . NICM (nonischemic cardiomyopathy) ([place])   . Noncompliance with medication regimen   . NSVT (nonsustained ventricular tachycardia) ([place])   . Numbness of right jaw    Had a stoke in 01/2013. Numbness is occasional, especially when trying to chew.  . OSA (obstructive sleep apnea)   . Productive cough 08/2013   With brown sputum   . S/P aortic valve replacement with bioprosthetic valve   . Stroke ([place]) 01/2013   weakness of right side from CVA  . Type 2 diabetes mellitus ([place])   . Uses hearing aid 2014   recently received new hearing aids    Past Surgical History:  Procedure Laterality Date  . AV FISTULA PLACEMENT Left 07/20/2018   Procedure: INSERTION ARTERIOVENOUS GORTEX GRAFT  LEFT ARM;  Surgeon: Rosetta Posner, MD;  Location: [facility];  Service: Vascular;  Laterality: Left;  . BIOPSY N/A 04/10/2014   Procedure: GASTRIC BIOPSY;  Surgeon: Daneil Dolin, MD;  Location: [facility];  Service: Endoscopy;  Laterality: N/A;  . BIOPSY  10/12/2015   Procedure: BIOPSY;  Surgeon: Daneil Dolin, MD;  [facility];  Service: Endoscopy;;  stomach bx's  . CARDIAC DEFIBRILLATOR PLACEMENT  11/12/12   [employer] Inogen MINI ICD implanted in [place] at [facility] AVR per Dr Nelly Laurence note  . COLONOSCOPY WITH PROPOFOL N/A 04/10/2014   RMR: Colonic Diverticulosis  . ESOPHAGOGASTRODUODENOSCOPY (EGD) WITH PROPOFOL N/A 04/10/2014   RMR: Mild erosive reflux esophagitis. Multiple antral polyps likely hyperplastic status post removal by hot snare cautery technique. Diffusely abnormal stomach status post gastric biopsy I suspect some of patients anemaia may be due to intermittent oozing from the stomach. It would be difficult and a risky proposition to attempt complete removal of all of his gastric polyps.  . ESOPHAGOGASTRODUODENOSCOPY (EGD) WITH PROPOFOL N/A 10/12/2015   Procedure: ESOPHAGOGASTRODUODENOSCOPY (EGD) WITH PROPOFOL;  Surgeon: Daneil Dolin, MD;  [facility];  Service: Endoscopy;  Laterality: N/A;  3532 - moved to 9:15  . INSERTION OF DIALYSIS CATHETER Right 07/20/2018   Procedure: EXCHANGE OF DIALYSIS CATHETER RIGHT INTERNAL JUGULAR;  Surgeon: Curt Jews  F, MD;  Location: [facility];  Service: Vascular;  Laterality: Right;  . IR FLUORO GUIDE CV LINE RIGHT  07/09/2018  . IR US GUIDE VASC ACCESS RIGHT  07/09/2018  . POLYPECTOMY N/A 04/10/2014   Procedure: GASTRIC POLYPECTOMY;  Surgeon: Daneil Dolin, MD;  Location[facility];  Service: Endoscopy;  Laterality: N/A;  . RIGHT HEART CATHETERIZATION N/A 02/06/2014   Procedure: RIGHT HEART CATH;  Surgeon: Larey Dresser, MD;  Location: [facility] CATH LAB;  Service: Cardiovascular;  Laterality: N/A;    Social  History   Socioeconomic History  . Marital status: Single    Spouse name: Not on file  . Number of children: Not on file  . Years of education: Not on file  . Highest education level: Not on file  Occupational History  . Occupation: disabled  Social Needs  . Financial resource strain: Not hard at all  . Food insecurity:    Worry: Never true    Inability: Never true  . Transportation needs:    Medical: No    Non-medical: No  Tobacco Use  . Smoking status: Former Smoker    Types: Cigarettes    Last attempt to quit: 11/05/1993    Years since quitting: 24.7  . Smokeless tobacco: Never Used  Substance and Sexual Activity  . Alcohol use: Not Currently    Comment: former heavy ETOH, none recently  . Drug use: No    Comment: prior history of cocaine use, smoking   . Sexual activity: Yes    Birth control/protection: None  Lifestyle  . Physical activity:    Days per week: 4 days    Minutes per session: 20 min  . Stress: Not at all  Relationships  . Social connections:    Talks on phone: Never    Gets together: Once a week    Attends religious service: Never    Active member of club or organization: No    Attends meetings of clubs or organizations: Never    Relationship status: Never married  . Intimate partner violence:    Fear of current or ex partner: No    Emotionally abused: No    Physically abused: No    Forced sexual activity: No  Other Topics Concern  . Not on file  Social History Narrative   Recently moved from [place] to be with family in [place] (2015).  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  Has h/o polysubstance abuse.  Baseline - able to ambulate, mild cognitive delay, able to toilet and shower himself, occasional use of walker/cane.   2019- [facility] of [facility]. Unable to ambulate unassisted.            Family History  Problem Relation Age of Onset  . Kidney disease Mother   . Kidney disease Sister   . Colon cancer  Neg Hx       VITAL SIGNS BP 97/63   Pulse 92   Temp (!) 97.4 F (36.3 C)   Resp 16   Ht 5\' 3"  (1.6 m)   Wt 149 lb 3.2 oz (67.7 kg)   BMI 26.43 kg/m   Outpatient Encounter Medications as of 08/17/2018  Medication Sig  . acetaminophen (TYLENOL) 325 MG tablet Take 2 tablets (650 mg total) by mouth every 4 (four) hours as needed for mild pain (or temp > 37.5 C (99.5 F)).  Marland Kitchen albuterol (PROVENTIL) (2.5 MG/3ML) 0.083% nebulizer solution Take 3 mLs (2.5 mg total)  by nebulization every 4 (four) hours as needed for wheezing or shortness of breath.  Marland Kitchen atorvastatin (LIPITOR) 80 MG tablet Take 80 mg by mouth daily.   . bictegravir-emtricitabine-tenofovir AF (BIKTARVY) 50-200-25 MG TABS tablet Take 1 tablet by mouth daily.  . calcium carbonate (TUMS EX) 750 MG chewable tablet Chew 1 tablet by mouth 2 (two) times daily.  . Cholecalciferol (VITAMIN D3) 2000 UNITS TABS Take 2,000 mg by mouth daily.  . coal tar (NEUTROGENA T-GEL) 0.5 % shampoo Apply to scalp on shower days (Twice a week) for dandruff and dry scalp Wed., and Sat.  . docusate sodium (COLACE) 100 MG capsule Take 100 mg by mouth daily.  . feeding supplement, ENSURE ENLIVE, (ENSURE ENLIVE) LIQD Take 237 mLs by mouth 2 (two) times daily between meals.  . metoprolol succinate (TOPROL-XL) 25 MG 24 hr tablet Take 1 tablet (25 mg total) by mouth daily.  . mometasone-formoterol (DULERA) 100-5 MCG/ACT AERO Inhale 2 puffs into the lungs 2 (two) times daily.  . multivitamin (RENA-VIT) TABS tablet Take 1 tablet by mouth at bedtime.  . NON FORMULARY Diet Type:  Dysphagia 2 diet with Honey Thick Liquids, NAS, Cons CHO  . ranitidine (ZANTAC) 150 MG tablet Take 150 mg by mouth at bedtime.  . senna-docusate (SENEXON-S) 8.6-50 MG tablet Take 1 tablet by mouth daily.  . tamsulosin (FLOMAX) 0.4 MG CAPS capsule Take 0.4 mg by mouth daily.  Marland Kitchen warfarin (COUMADIN) 5 MG tablet Take 5 mg by mouth at bedtime.    No facility-administered encounter medications on  file as of 08/17/2018.      SIGNIFICANT DIAGNOSTIC EXAMS   PREVIOUS:   07-06-18: chest x-ray: Enlargement of cardiac silhouette with vascular congestion and question minimal pulmonary edema.  07-06-18: ct of head;  1. New age indeterminate RIGHT basal ganglia small infarcts. 2. Old RIGHT temporal occipital/PCA territory infarct. 3. Old basal ganglia, thalami, pontine and cerebellar small infarcts. Moderate to severe chronic small vessel ischemic changes. 4. Moderate parenchymal brain volume loss, advanced for age.  07-07-18: US renal: Medical renal disease changes of both kidneys. Small renal cysts in both kidneys. No evidence of hydronephrosis.  07-11-18: ct of head:  1. Stable head CT, with no new acute intracranial abnormality. 2. In band cerebral atrophy with chronic small vessel ischemic disease with multiple chronic ischemic infarcts as above. 3. Acute pan sinusitis, worsened from previous.  05-12-19 ct of abdomen and pelvis: Diverticulosis without diverticulitis. Bilateral renal cysts. Mild bibasilar atelectasis.  07-12-18: 2-d echo:   1. The left ventricle has severely reduced systolic function, with an ejection fraction of 20-25%. The cavity size was mildly dilated. There is mildly increased left ventricular wall thickness. Left ventricular diastolic Doppler parameters are  consistent with impaired relaxation Left ventricular diffuse hypokinesis.  2. The right ventricle has normal systolic function. The cavity was normal. There is no increase in right ventricular wall thickness.  3. Left atrial size was mildly dilated.  4. The mitral valve is normal in structure. There is mild thickening.  5. The tricuspid valve is normal in structure.  6. AVR.  7. The pulmonic valve was normal in structure.  8. Severe global reduction in LV systolic function; mild LVH; mild LVE; mild LAE; s/p AVR (not well visualized; mean gradient 12 mmHg; AVA 1.5 cm2 but may be underestimated); no  vegetations but cannot R/O with this study; suggest TEE if clinically  indicated.  07-20-18: chest x-ray: Right IJ dialysis catheter in good position without complicating features. Cardiac  enlargement and vascular congestion without overt pulmonary edema.  NO NEW EXAMS.    LABS REVIEWED PREVIOUS:   05-22-18: hgb a1c 6.5 chol 100 ldl 43; trig 170; hdl 23 07-04-18: wbc  3.7; hgb 14.0; hct 45.6; mcv 91.8; plt 124; glucose 100; bun 45; creat 2.44; k+ 4.7; na++ 139 BNP 679.0  07-06-18: wbc 1.9; hgb 15.2; hct 48.8; mcv 91.2 plt 107; glucose 136; bun 43; creat 3.01; k+ 5.9; na++ 136; ca 8.8 ast 70 alt 46; albumin 3.0 BNP 554.5   blood culture: no growth /e-coli  07-08-18: wbc 13.8; hgb 13.1; hct 41.1; mcv 93.2 plt 73 glucose 108 ;bun 74; creat 5.32; k+ 5.5; na++ 139; ca 7.5; ast 80 alt 117; albumin 2.3  07-09-18; tsh 3.098 07-17-18: wbc 3.1; hgb 1.8; hct 37.3; mcv 89.9 plt 103 glucose 114; bun 95; creat 6.00; k+ 3.5; na++ 139; ca 6.4 mag 2.1 07-27-18: INR 2.3: coumadin 5 mg daily   07-30-18: wbc  6.8; hgb 10.7; hct 33.6; mcv 89.6; plt 94 glucose 90; bun 93; creat 7.47; k+ 4.2; na++ 135; ca 7.1 08-02-18: INR 1.9    TODAY;   08-15-18: HIV quant: <20 CD4: 360  08-16-18: INR 3.2    Review of Systems  Unable to perform ROS: Dementia (unable to participate )   Physical Exam Constitutional:      General: He is not in acute distress.    Appearance: He is well-developed. He is not diaphoretic.  Neck:     Thyroid: No thyromegaly.  Cardiovascular:     Rate and Rhythm: Normal rate and regular rhythm.     Pulses: Normal pulses.     Heart sounds: Murmur present.     Comments: 2/6 ICD in place  Pulmonary:     Effort: Pulmonary effort is normal. No respiratory distress.     Breath sounds: Normal breath sounds.  Abdominal:     General: Bowel sounds are normal. There is no distension.     Palpations: Abdomen is soft.     Tenderness: There is no abdominal tenderness.  Musculoskeletal:     Right lower leg:  No edema.     Left lower leg: No edema.     Comments: Is able to move all extremities   Lymphadenopathy:     Cervical: No cervical adenopathy.  Skin:    General: Skin is warm and dry.     Comments: Right arm a/v fistula: + thrill + bruit   Neurological:     Mental Status: He is alert. Mental status is at baseline. He is disoriented.  Psychiatric:        Mood and Affect: Mood normal.     ASSESSMENT/ PLAN:  TODAY:   1. Other emphysema: is stable will continue dulera 100/5 mcg 2 puffs twice daily and has albuterol neb every 4 hours as needed  2. HIV disease: is without change will continue Biktarvy 20-200-25 mg daily is followed by I/D  3. CVA/Mulitple cva: is neurologically stable will continue to monitor   PREVIOUS   4. BPH with obstruction/lower urinary tract symptoms: is stable will continue flomax 0.4 mg daily   5. GERD without esophagitis: is stable will continue zantac 150 mg daily   6. Chronic constipation: is stable will continue senna s daily colace daily   7. 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: is without signs of aspiration present will continue honey thick liquids.   8. Protein calorie malnutrition, severe: is without change albumin 2.3 weight 149 pounds; will continue supplements  as directed  9. Chronic combined systolic and diastolic heart failure: ICD in place without change EF 20-25% (07-12-18) will continue toprol xl 25 mg daily   10. Essential hypertension: is stable b/p 97/63 will continue toprol xl 25 mg daily   11. PAF (paroxysmal atrial fibrillation); status post bioprosthetic AVR heart rate stable will continue toprol xl 25 mg daily for rate control is on chronic coumadin therapy  12. Dyslipidemia associated with type 2 diabetes mellitus is stable ldl 43 will continue lipitor 80 mg daily   13. Type 2 diabetes mellitus with hypertension and end stage renal disease on dialysis: is stable hgb a1c 6.5 will continue to monitor his status. .   14. Chronic kidney  disease with end stage renal disease on dialysis due to type 2 diabetes mellitus/dialysis dependent: is without change is being dialyzed three times weekly is follow by nephrology.    MD is aware of resident's narcotic use and is in agreement with current plan of care. We will attempt to wean resident as apropriate   Ok Edwards NP [facility]  Contact [phone] Monday through Friday 8am- 5pm  After hours call [phone]

## 2018-08-20 ENCOUNTER — Non-Acute Institutional Stay (SKILLED_NURSING_FACILITY): Payer: Medicare HMO | Admitting: Adult Health

## 2018-08-20 ENCOUNTER — Encounter: Payer: Self-pay | Admitting: Adult Health

## 2018-08-20 DIAGNOSIS — E43 Unspecified severe protein-calorie malnutrition: Secondary | ICD-10-CM | POA: Diagnosis not present

## 2018-08-20 DIAGNOSIS — I69391 Dysphagia following cerebral infarction: Secondary | ICD-10-CM

## 2018-08-20 DIAGNOSIS — I5042 Chronic combined systolic (congestive) and diastolic (congestive) heart failure: Secondary | ICD-10-CM

## 2018-08-20 NOTE — Progress Notes (Signed)
Location:   [place] Room Number: 148 D Place of Service:  SNF (31)   CODE STATUS: DNR  No Known Allergies  Chief Complaint  Patient presents with  . 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 There are no reports of changes in appetite; no uncontrolled pain; no indications of aspiration present. There are no reports of fevers present.   Past Medical History:  Diagnosis Date  . AICD (automatic cardioverter/defibrillator) present   . Alcoholism in remission ([place])   . Anemia   . Aortic insufficiency   . Arthritis   . At moderate risk for fall   . Bell's palsy   . Chronic systolic heart failure (HCC)    a. EF previously 15-20% with cath in 2014 showing no significant CAD b. EF 20-25% by echo in 06/2018  . CKD (chronic kidney disease), stage IV ([place])   . COPD (chronic obstructive pulmonary disease) ([place])   . Essential hypertension, benign   . HIV disease ([place]) 09/06/2016  . Hyperlipidemia   . NICM (nonischemic cardiomyopathy) ([place])   . Noncompliance with medication regimen   . NSVT (nonsustained ventricular tachycardia) ([place])   . Numbness of right jaw    Had a stoke in 01/2013. Numbness is occasional, especially when trying to chew.  . OSA (obstructive sleep apnea)   . Productive cough 08/2013   With brown sputum   . S/P aortic valve replacement with bioprosthetic valve   . Stroke ([place]) 01/2013   weakness of right side from CVA  . Type 2 diabetes mellitus ([place])   . Uses hearing aid 2014   recently received new hearing aids    Past Surgical History:  Procedure Laterality Date  . AV FISTULA PLACEMENT Left 07/20/2018   Procedure: INSERTION ARTERIOVENOUS GORTEX GRAFT   LEFT ARM;  Surgeon: Rosetta Posner, MD;  Location: [facility];  Service: Vascular;  Laterality: Left;  . BIOPSY N/A 04/10/2014   Procedure: GASTRIC BIOPSY;  Surgeon: Daneil Dolin, MD;  Location: [facility];  Service: Endoscopy;  Laterality: N/A;  . BIOPSY  10/12/2015   Procedure: BIOPSY;  Surgeon: Daneil Dolin, MD;  [facility];  Service: Endoscopy;;  stomach bx's  . CARDIAC DEFIBRILLATOR PLACEMENT  11/12/12   [employer] Inogen MINI ICD implanted in [place] at [place] AVR per Dr Nelly Laurence note  . COLONOSCOPY WITH PROPOFOL N/A 04/10/2014   RMR: Colonic Diverticulosis  . ESOPHAGOGASTRODUODENOSCOPY (EGD) WITH PROPOFOL N/A 04/10/2014   RMR: Mild erosive reflux esophagitis. Multiple antral polyps likely hyperplastic status post removal by hot snare cautery technique. Diffusely abnormal stomach status post gastric biopsy I suspect some of patients anemaia may be due to intermittent oozing from the stomach. It would be difficult and a risky proposition to attempt complete removal of all of his gastric polyps.  . ESOPHAGOGASTRODUODENOSCOPY (EGD) WITH PROPOFOL N/A 10/12/2015   Procedure: ESOPHAGOGASTRODUODENOSCOPY (EGD) WITH PROPOFOL;  Surgeon: Daneil Dolin, MD;  [facility];  Service: Endoscopy;  Laterality: N/A;  4315 - moved to 9:15  . INSERTION OF DIALYSIS CATHETER Right 07/20/2018   Procedure: EXCHANGE OF DIALYSIS CATHETER RIGHT INTERNAL JUGULAR;  Surgeon: Rosetta Posner, MD;  Location: [facility] OR;  Service: Vascular;  Laterality: Right;  . IR FLUORO GUIDE CV LINE RIGHT  07/09/2018  . IR US GUIDE VASC ACCESS RIGHT  07/09/2018  . POLYPECTOMY N/A 04/10/2014   Procedure: GASTRIC POLYPECTOMY;  Surgeon: Daneil Dolin, MD;  Location: [facility];  Service: Endoscopy;  Laterality: N/A;  . RIGHT HEART CATHETERIZATION N/A 02/06/2014   Procedure: RIGHT HEART CATH;  Surgeon: Larey Dresser, MD;  Location: [facility] CATH LAB;  Service: Cardiovascular;   Laterality: N/A;    Social History   Socioeconomic History  . Marital status: Single    Spouse name: Not on file  . Number of children: Not on file  . Years of education: Not on file  . Highest education level: Not on file  Occupational History  . Occupation: disabled  Social Needs  . Financial resource strain: Not hard at all  . Food insecurity:    Worry: Never true    Inability: Never true  . Transportation needs:    Medical: No    Non-medical: No  Tobacco Use  . Smoking status: Former Smoker    Types: Cigarettes    Last attempt to quit: 11/05/1993    Years since quitting: 24.8  . Smokeless tobacco: Never Used  Substance and Sexual Activity  . Alcohol use: Not Currently    Comment: former heavy ETOH, none recently  . Drug use: No    Comment: prior history of cocaine use, smoking   . Sexual activity: Yes    Birth control/protection: None  Lifestyle  . Physical activity:    Days per week: 4 days    Minutes per session: 20 min  . Stress: Not at all  Relationships  . Social connections:    Talks on phone: Never    Gets together: Once a week    Attends religious service: Never    Active member of club or organization: No    Attends meetings of clubs or organizations: Never    Relationship status: Never married  . Intimate partner violence:    Fear of current or ex partner: No    Emotionally abused: No    Physically abused: No    Forced sexual activity: No  Other Topics Concern  . Not on file  Social History Narrative   Recently moved from [place] to be with family in [place] (2015).  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  Has h/o polysubstance abuse.  Baseline - able to ambulate, mild cognitive delay, able to toilet and shower himself, occasional use of walker/cane.   2019- resident of [facility]. Unable to ambulate unassisted.            Family History  Problem Relation Age of Onset  . Kidney disease Mother   . Kidney  disease Sister   . Colon cancer Neg Hx       VITAL SIGNS BP 95/63   Pulse 95   Temp (!) 97.4 F (36.3 C)   Resp 18   Ht 5\' 3"  (1.6 m)   Wt 149 lb 3.2 oz (67.7 kg)   SpO2 96%   BMI 26.43 kg/m   Outpatient Encounter Medications as of 08/20/2018  Medication Sig  . acetaminophen (TYLENOL) 325 MG tablet Take 2 tablets (650 mg total) by mouth every 4 (four) hours as needed for mild pain (or temp > 37.5 C (99.5 F)).  Marland Kitchen albuterol (PROVENTIL) (2.5 MG/3ML) 0.083% nebulizer  solution Take 3 mLs (2.5 mg total) by nebulization every 4 (four) hours as needed for wheezing or shortness of breath.  Marland Kitchen atorvastatin (LIPITOR) 80 MG tablet Take 80 mg by mouth daily.   . bictegravir-emtricitabine-tenofovir AF (BIKTARVY) 50-200-25 MG TABS tablet Take 1 tablet by mouth daily.  . calcium carbonate (TUMS EX) 750 MG chewable tablet Chew 1 tablet by mouth 2 (two) times daily.  . Cholecalciferol (VITAMIN D3) 2000 UNITS TABS Take 2,000 mg by mouth daily.  . coal tar (NEUTROGENA T-GEL) 0.5 % shampoo Apply to scalp on shower days (Twice a week) for dandruff and dry scalp Wed., and Sat.  . docusate sodium (COLACE) 100 MG capsule Take 100 mg by mouth daily.  . feeding supplement, ENSURE ENLIVE, (ENSURE ENLIVE) LIQD Take 237 mLs by mouth 2 (two) times daily between meals.  . metoprolol succinate (TOPROL-XL) 25 MG 24 hr tablet Take 1 tablet (25 mg total) by mouth daily.  . mometasone-formoterol (DULERA) 100-5 MCG/ACT AERO Inhale 2 puffs into the lungs 2 (two) times daily.  . multivitamin (RENA-VIT) TABS tablet Take 1 tablet by mouth at bedtime.  . NON FORMULARY Diet Type:  Dysphagia 2 diet with Honey Thick Liquids, NAS, Cons CHO  . ranitidine (ZANTAC) 150 MG tablet Take 150 mg by mouth at bedtime.  . senna-docusate (SENEXON-S) 8.6-50 MG tablet Take 1 tablet by mouth daily.  . tamsulosin (FLOMAX) 0.4 MG CAPS capsule Take 0.4 mg by mouth daily.  Marland Kitchen warfarin (COUMADIN) 5 MG tablet Take 5 mg by mouth at bedtime.    No  facility-administered encounter medications on file as of 08/20/2018.      SIGNIFICANT DIAGNOSTIC EXAMS   PREVIOUS:   07-06-18: chest x-ray: Enlargement of cardiac silhouette with vascular congestion and question minimal pulmonary edema.  07-06-18: ct of head;  1. New age indeterminate RIGHT basal ganglia small infarcts. 2. Old RIGHT temporal occipital/PCA territory infarct. 3. Old basal ganglia, thalami, pontine and cerebellar small infarcts. Moderate to severe chronic small vessel ischemic changes. 4. Moderate parenchymal brain volume loss, advanced for age.  07-07-18: US renal: Medical renal disease changes of both kidneys. Small renal cysts in both kidneys. No evidence of hydronephrosis.  07-11-18: ct of head:  1. Stable head CT, with no new acute intracranial abnormality. 2. In band cerebral atrophy with chronic small vessel ischemic disease with multiple chronic ischemic infarcts as above. 3. Acute pan sinusitis, worsened from previous.  05-12-19 ct of abdomen and pelvis: Diverticulosis without diverticulitis. Bilateral renal cysts. Mild bibasilar atelectasis.  07-12-18: 2-d echo:   1. The left ventricle has severely reduced systolic function, with an ejection fraction of 20-25%. The cavity size was mildly dilated. There is mildly increased left ventricular wall thickness. Left ventricular diastolic Doppler parameters are  consistent with impaired relaxation Left ventricular diffuse hypokinesis.  2. The right ventricle has normal systolic function. The cavity was normal. There is no increase in right ventricular wall thickness.  3. Left atrial size was mildly dilated.  4. The mitral valve is normal in structure. There is mild thickening.  5. The tricuspid valve is normal in structure.  6. AVR.  7. The pulmonic valve was normal in structure.  8. Severe global reduction in LV systolic function; mild LVH; mild LVE; mild LAE; s/p AVR (not well visualized; mean gradient 12 mmHg; AVA  1.5 cm2 but may be underestimated); no vegetations but cannot R/O with this study; suggest TEE if clinically  indicated.  07-20-18: chest x-ray: Right IJ dialysis catheter  in good position without complicating features. Cardiac enlargement and vascular congestion without overt pulmonary edema.  NO NEW EXAMS.    LABS REVIEWED PREVIOUS:   05-22-18: hgb a1c 6.5 chol 100 ldl 43; trig 170; hdl 23 07-04-18: wbc  3.7; hgb 14.0; hct 45.6; mcv 91.8; plt 124; glucose 100; bun 45; creat 2.44; k+ 4.7; na++ 139 BNP 679.0  07-06-18: wbc 1.9; hgb 15.2; hct 48.8; mcv 91.2 plt 107; glucose 136; bun 43; creat 3.01; k+ 5.9; na++ 136; ca 8.8 ast 70 alt 46; albumin 3.0 BNP 554.5   blood culture: no growth /e-coli  07-08-18: wbc 13.8; hgb 13.1; hct 41.1; mcv 93.2 plt 73 glucose 108 ;bun 74; creat 5.32; k+ 5.5; na++ 139; ca 7.5; ast 80 alt 117; albumin 2.3  07-09-18; tsh 3.098 07-17-18: wbc 3.1; hgb 1.8; hct 37.3; mcv 89.9 plt 103 glucose 114; bun 95; creat 6.00; k+ 3.5; na++ 139; ca 6.4 mag 2.1 07-27-18: INR 2.3: coumadin 5 mg daily   07-30-18: wbc  6.8; hgb 10.7; hct 33.6; mcv 89.6; plt 94 glucose 90; bun 93; creat 7.47; k+ 4.2; na++ 135; ca 7.1 08-02-18: INR 1.9   08-15-18: HIV quant: <20 CD4: 360  08-16-18: INR 3.2   NO NEW LABS.    Review of Systems  Unable to perform ROS: Dementia (unable to participate )    Physical Exam Constitutional:      General: Keith Young is not in acute distress.    Appearance: Keith Young is well-developed. Keith Young is not diaphoretic.  Neck:     Musculoskeletal: Neck supple.     Thyroid: No thyromegaly.  Cardiovascular:     Rate and Rhythm: Normal rate. Rhythm irregular.     Pulses: Normal pulses.     Heart sounds: Murmur present.     Comments: 2/6 ICD in place  Pulmonary:     Effort: Pulmonary effort is normal. No respiratory distress.     Breath sounds: Normal breath sounds.  Abdominal:     General: Bowel sounds are normal. There is no distension.     Palpations: Abdomen is soft.      Tenderness: There is no abdominal tenderness.  Musculoskeletal:     Right lower leg: No edema.     Left lower leg: No edema.     Comments: Is able to move all extremities   Lymphadenopathy:     Cervical: No cervical adenopathy.  Skin:    General: Skin is warm and dry.     Comments: Right arm a/v fistula: + thrill + bruit    Neurological:     Mental Status: Keith Young is alert. Mental status is at baseline.  Psychiatric:        Mood and Affect: Mood normal.      ASSESSMENT/ PLAN:  TODAY:   1. 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: is without signs of aspiration present; will continue honey thick liquids  2. Protein calorie malnutrition severe: is without change: albumin 2.3 weight is 149 pounds will continue supplements as directed  3. Chronic combined systolic and diastolic heart failure ICD in place: is stable EF 20-25% (07-12-18) will continue toprol xl 25 mg daily   PREVIOUS   4. BPH with obstruction/lower urinary tract symptoms: is stable will continue flomax 0.4 mg daily   5. GERD without esophagitis: is stable will continue zantac 150 mg daily   6. Chronic constipation: is stable will continue senna s daily colace daily   7. Essential hypertension: is stable b/p 97/63 will continue toprol xl  25 mg daily   8. PAF (paroxysmal atrial fibrillation); status post bioprosthetic AVR heart rate stable will continue toprol xl 25 mg daily for rate control is on chronic coumadin therapy  9. Dyslipidemia associated with type 2 diabetes mellitus is stable ldl 43 will continue lipitor 80 mg daily   10 Type 2 diabetes mellitus with hypertension and end stage renal disease on dialysis: is stable hgb a1c 6.5 will continue to monitor his status. .   11. Chronic kidney disease with end stage renal disease on dialysis due to type 2 diabetes mellitus/dialysis dependent: is without change is being dialyzed three times weekly is follow by nephrology.   12. Other emphysema: is stable will continue dulera  100/5 mcg 2 puffs twice daily and has albuterol neb every 4 hours as needed  13. HIV disease: is without change will continue Biktarvy 20-200-25 mg daily is followed by I/D  14. CVA/Mulitple cva: is neurologically stable will continue to monitor      MD is aware of resident's narcotic use and is in agreement with current plan of care. We will attempt to wean resident as apropriate   Ok Edwards NP [facility]  Contact [phone] Monday through Friday 8am- 5pm  After hours call [phone]

## 2018-08-23 ENCOUNTER — Telehealth: Payer: Self-pay | Admitting: *Deleted

## 2018-08-23 ENCOUNTER — Encounter (HOSPITAL_COMMUNITY)
Admission: RE | Admit: 2018-08-23 | Discharge: 2018-08-23 | Disposition: A | Discharge status: Home / Self Care | Payer: Medicare HMO | Source: Skilled Nursing Facility | Attending: Internal Medicine | Admitting: Internal Medicine

## 2018-08-23 DIAGNOSIS — I132 Hypertensive heart and chronic kidney disease with heart failure and with stage 5 chronic kidney disease, or end stage renal disease: Secondary | ICD-10-CM | POA: Diagnosis present

## 2018-08-23 DIAGNOSIS — Z7901 Long term (current) use of anticoagulants: Secondary | ICD-10-CM | POA: Diagnosis not present

## 2018-08-23 DIAGNOSIS — A419 Sepsis, unspecified organism: Secondary | ICD-10-CM | POA: Diagnosis present

## 2018-08-23 LAB — PROTIME-INR
INR: 1.7 — AB (ref 0.8–1.2)
Prothrombin Time: 19.8 seconds — ABNORMAL HIGH (ref 11.4–15.2)

## 2018-08-23 NOTE — Telephone Encounter (Signed)
Received triage alert for VF-zone VT episode terminated by ATP x1 during charge, charge diverted.  Also reviewed VT episode from 07/30/18 (see strip in phone note from 07/31/18)--options to prevent inappropriate shocks in the future based on that episode: increase VF zone to 210bpm and increase duration or add a VT zone with VF zone at 240bpm.   Spoke with Windell Norfolk, patient's nurse at [facility]. Per Izora Gala, she did not receive report about patient having any symptoms last night. He was likely asleep at the time of the episode. Pt is at dialysis now, but she will ask if he was symptomatic when he returns this afternoon. Confirmed still taking Toprol XL 25mg  daily and warfarin. Pt is a DNR per scanned-in form.  Will route to Dr. Lovena Le for review and recommendations. Pt is scheduled to see Dr. Lovena Le in [place] on 10/24/18.

## 2018-08-28 NOTE — Telephone Encounter (Signed)
Continue current meds. GT

## 2018-09-04 ENCOUNTER — Encounter: Payer: Self-pay | Admitting: Internal Medicine

## 2018-09-04 ENCOUNTER — Other Ambulatory Visit (HOSPITAL_COMMUNITY)
Admission: RE | Admit: 2018-09-04 | Discharge: 2018-09-04 | Disposition: A | Discharge status: Home / Self Care | Payer: Medicare HMO | Source: Skilled Nursing Facility | Attending: Internal Medicine | Admitting: Internal Medicine

## 2018-09-04 ENCOUNTER — Non-Acute Institutional Stay (SKILLED_NURSING_FACILITY): Payer: Medicare HMO | Admitting: Internal Medicine

## 2018-09-04 DIAGNOSIS — I480 Paroxysmal atrial fibrillation: Secondary | ICD-10-CM

## 2018-09-04 DIAGNOSIS — E876 Hypokalemia: Secondary | ICD-10-CM

## 2018-09-04 DIAGNOSIS — D631 Anemia in chronic kidney disease: Secondary | ICD-10-CM

## 2018-09-04 DIAGNOSIS — N189 Chronic kidney disease, unspecified: Secondary | ICD-10-CM | POA: Diagnosis not present

## 2018-09-04 DIAGNOSIS — I120 Hypertensive chronic kidney disease with stage 5 chronic kidney disease or end stage renal disease: Secondary | ICD-10-CM

## 2018-09-04 DIAGNOSIS — K2100 Gastro-esophageal reflux disease with esophagitis, without bleeding: Secondary | ICD-10-CM

## 2018-09-04 DIAGNOSIS — N186 End stage renal disease: Secondary | ICD-10-CM

## 2018-09-04 DIAGNOSIS — E1122 Type 2 diabetes mellitus with diabetic chronic kidney disease: Secondary | ICD-10-CM

## 2018-09-04 DIAGNOSIS — I132 Hypertensive heart and chronic kidney disease with heart failure and with stage 5 chronic kidney disease, or end stage renal disease: Secondary | ICD-10-CM | POA: Diagnosis present

## 2018-09-04 DIAGNOSIS — J438 Other emphysema: Secondary | ICD-10-CM

## 2018-09-04 DIAGNOSIS — Z992 Dependence on renal dialysis: Secondary | ICD-10-CM

## 2018-09-04 LAB — RENAL FUNCTION PANEL
Albumin: 2.9 g/dL — ABNORMAL LOW (ref 3.5–5.0)
Anion gap: 9 (ref 5–15)
BUN: 16 mg/dL (ref 8–23)
CO2: 29 mmol/L (ref 22–32)
Calcium: 8.3 mg/dL — ABNORMAL LOW (ref 8.9–10.3)
Chloride: 100 mmol/L (ref 98–111)
Creatinine, Ser: 2.64 mg/dL — ABNORMAL HIGH (ref 0.61–1.24)
GFR calc Af Amer: 28 mL/min — ABNORMAL LOW (ref >60)
GFR calc non Af Amer: 24 mL/min — ABNORMAL LOW (ref >60)
Glucose, Bld: 90 mg/dL (ref 70–99)
Phosphorus: 3 mg/dL (ref 2.5–4.6)
Potassium: 2.8 mmol/L — ABNORMAL LOW (ref 3.5–5.1)
Sodium: 138 mmol/L (ref 135–145)

## 2018-09-04 LAB — HEMOGLOBIN AND HEMATOCRIT, BLOOD
HCT: 23.6 % — ABNORMAL LOW (ref 39.0–52.0)
Hemoglobin: 7.4 g/dL — ABNORMAL LOW (ref 13.0–17.0)

## 2018-09-04 LAB — PROTEIN / CREATININE RATIO, URINE
Creatinine, Urine: 367.83 mg/dL
Protein Creatinine Ratio: 0.6 mg/mg{Cre} — ABNORMAL HIGH (ref 0.00–0.15)
Total Protein, Urine: 220 mg/dL

## 2018-09-04 LAB — IRON AND TIBC
Iron: 52 ug/dL (ref 45–182)
Saturation Ratios: 24 % (ref 17.9–39.5)
TIBC: 218 ug/dL — ABNORMAL LOW (ref 250–450)
UIBC: 166 ug/dL

## 2018-09-04 NOTE — Progress Notes (Signed)
Location:  [facility] Room Number: [place] of Service:  SNF [phone]) Provider:  Granville Lewis, PA  Virgie Dad, MD  Patient Care Team: Virgie Dad, MD as PCP - General (Internal Medicine) Evans Lance, MD as PCP - Electrophysiology (Cardiology) Harl Bowie Alphonse Guild, MD as PCP - Cardiology (Cardiology) Gala Romney Cristopher Estimable, MD as Consulting Physician (Gastroenterology) Rolm Baptise as Physician Assistant (Internal Medicine)  Extended Emergency Contact Information Primary Emergency Contact: [facility] Address: [address] [place] [address] Johnnette Litter of [place] Phone: [phone] Mobile Phone: [phone] Relation: Relative Secondary Emergency Contact: [facility] Address: [address], [place] [address] [place] of [place] Phone: [phone] Mobile Phone: [phone] Relation: Sister  Code Status:  DNR Goals of care: Advanced Directive information Advanced Directives 09/04/2018  Does Patient Have a Medical Advance Directive? Yes  Type of Advance Directive Out of facility DNR (pink MOST or yellow form)  Does patient want to make changes to medical advance directive? No - Patient declined  Copy of [place] in Chart? -  Would patient like information on creating a medical advance directive? -  Pre-existing out of facility DNR order (yellow form or pink MOST form) Yellow form placed in chart (order not valid for inpatient use)     Chief Complaint  Patient presents with  . Medical Management of Chronic Issues    Hypertension, Diabettes Mellitus Type 2  . Acute Visit    Low Potassium  . Best Practice Recommendations    Due for Urine Microalbumin    HPI:  Pt is a 68 y.o. male seen today for medical management of chronic diseases.--   Occluding history of systolic and diastolic CHF-end-stage renal disease on dialysis-history of dysphasia protein calorie  malnutrition- hypertension- atrial fibrillation- hyperlipidemia- history of HIV as well as dementia  Also seen today acutely for potassium of 2.8.  Patient clinically appears to be doing well he is sitting in his wheelchair comfortably-she did have labs today which showed potassium of 2.8 he does have chronic renal failure with a creatinine of 2.64 today.  I note his hemoglobin is 7.4 iron was 52 total iron-binding capacity was 218 previous hemoglobins have been more in the 10 range.  Clinically he does not complain of any dizziness or weakness fever chills he is somewhat of a poor historian.  Regards to dysphasia he is on a dysphasia diet has not had recent aspiration  He was hospitalized for an extended period back in February for urosepsis because of E. coli bacteremia also had renal failure and atrial fibrillation- he was started on Coumadin with goal INR 2-3.  He is on a beta-blocker this appears to be rate controlled.  Regards end-stage renal disease he is on dialysis and apparently is tolerating this well.  He is a type II diabetic currently diet controlled blood sugars appear to be more in the 80s to low 100s in the morning more mid 100s in the evening Hemoglobin A1c in December was 6.5 which actually showed mild improvement Again he has no complaints today he does appear to have a lowered hemoglobin and again low potassium  Past Medical History:  Diagnosis Date  . AICD (automatic cardioverter/defibrillator) present   . Alcoholism in remission ([place])   . Anemia   . Aortic insufficiency   . Arthritis   .  At moderate risk for fall   . Bell's palsy   . Chronic systolic heart failure (HCC)    a. EF previously 15-20% with cath in 2014 showing no significant CAD b. EF 20-25% by echo in 06/2018  . CKD (chronic kidney disease), stage IV ([place])   . COPD (chronic obstructive pulmonary disease) ([place])   . Essential hypertension, benign   . HIV disease ([place]) 09/06/2016  . Hyperlipidemia    . NICM (nonischemic cardiomyopathy) ([place])   . Noncompliance with medication regimen   . NSVT (nonsustained ventricular tachycardia) ([place])   . Numbness of right jaw    Had a stoke in 01/2013. Numbness is occasional, especially when trying to chew.  . OSA (obstructive sleep apnea)   . Productive cough 08/2013   With brown sputum   . S/P aortic valve replacement with bioprosthetic valve   . Stroke ([place]) 01/2013   weakness of right side from CVA  . Type 2 diabetes mellitus ([place])   . Uses hearing aid 2014   recently received new hearing aids   Past Surgical History:  Procedure Laterality Date  . AV FISTULA PLACEMENT Left 07/20/2018   Procedure: INSERTION ARTERIOVENOUS GORTEX GRAFT  LEFT ARM;  Surgeon: Rosetta Posner, MD;  Location: [facility];  Service: Vascular;  Laterality: Left;  . BIOPSY N/A 04/10/2014   Procedure: GASTRIC BIOPSY;  Surgeon: Daneil Dolin, MD;  Location: [facility];  Service: Endoscopy;  Laterality: N/A;  . BIOPSY  10/12/2015   Procedure: BIOPSY;  Surgeon: Daneil Dolin, MD;  [facility];  Service: Endoscopy;;  stomach bx's  . CARDIAC DEFIBRILLATOR PLACEMENT  11/12/12   [employer] Inogen MINI ICD implanted in [place] at [facility] per Dr Nelly Laurence note  . COLONOSCOPY WITH PROPOFOL N/A 04/10/2014   RMR: Colonic Diverticulosis  . ESOPHAGOGASTRODUODENOSCOPY (EGD) WITH PROPOFOL N/A 04/10/2014   RMR: Mild erosive reflux esophagitis. Multiple antral polyps likely hyperplastic status post removal by hot snare cautery technique. Diffusely abnormal stomach status post gastric biopsy I suspect some of patients anemaia may be due to intermittent oozing from the stomach. It would be difficult and a risky proposition to attempt complete removal of all of his gastric polyps.  . ESOPHAGOGASTRODUODENOSCOPY (EGD) WITH PROPOFOL N/A 10/12/2015   Procedure: ESOPHAGOGASTRODUODENOSCOPY (EGD) WITH PROPOFOL;  Surgeon: Daneil Dolin,  MD;  [facility];  Service: Endoscopy;  Laterality: N/A;  [id number] - moved to 9:15  . INSERTION OF DIALYSIS CATHETER Right 07/20/2018   Procedure: EXCHANGE OF DIALYSIS CATHETER RIGHT INTERNAL JUGULAR;  Surgeon: Rosetta Posner, MD;  Location: [facility];  Service: Vascular;  Laterality: Right;  . IR FLUORO GUIDE CV LINE RIGHT  07/09/2018  . IR US GUIDE VASC ACCESS RIGHT  07/09/2018  . POLYPECTOMY N/A 04/10/2014   Procedure: GASTRIC POLYPECTOMY;  Surgeon: Daneil Dolin, MD;  Location[facility];  Service: Endoscopy;  Laterality: N/A;  . RIGHT HEART CATHETERIZATION N/A 02/06/2014   Procedure: RIGHT HEART CATH;  Surgeon: Larey Dresser, MD;  [facility] C[facility] LAB;  Service: Cardiovascular;  Laterality: N/A;    No Known Allergies  Outpatient Encounter Medications as of 09/04/2018  Medication Sig  . acetaminophen (TYLENOL) 325 MG tablet Take 2 tablets (650 mg total) by mouth every 4 (four) hours as needed for mild pain (or temp > 37.5 C (99.5 F)).  Marland Kitchen albuterol (PROVENTIL) (2.5 MG/3ML) 0.083% nebulizer solution Take 3 mLs (2.5 mg  total) by nebulization every 4 (four) hours as needed for wheezing or shortness of breath.  Marland Kitchen atorvastatin (LIPITOR) 80 MG tablet Take 80 mg by mouth daily.   . bictegravir-emtricitabine-tenofovir AF (BIKTARVY) 50-200-25 MG TABS tablet Take 1 tablet by mouth daily.  . calcium carbonate (TUMS EX) 750 MG chewable tablet Chew 1 tablet by mouth 2 (two) times daily.  . Cholecalciferol (VITAMIN D3) 2000 UNITS TABS Take 2,000 mg by mouth daily.  . coal tar (NEUTROGENA T-GEL) 0.5 % shampoo Apply to scalp on shower days (Twice a week) for dandruff and dry scalp Wed., and Sat.  . docusate sodium (COLACE) 100 MG capsule Take 100 mg by mouth daily.  . feeding supplement, ENSURE ENLIVE, (ENSURE ENLIVE) LIQD Take 237 mLs by mouth 2 (two) times daily between meals.  . metoprolol succinate (TOPROL-XL) 25 MG 24 hr tablet Take 1 tablet (25 mg total) by mouth daily.  . mometasone-formoterol  (DULERA) 100-5 MCG/ACT AERO Inhale 2 puffs into the lungs 2 (two) times daily.  . multivitamin (RENA-VIT) TABS tablet Take 1 tablet by mouth at bedtime.  . NON FORMULARY Diet Type:  Dysphagia 2 diet with Honey Thick Liquids, NAS, Cons CHO  . ranitidine (ZANTAC) 150 MG tablet Take 150 mg by mouth at bedtime.  . senna-docusate (SENEXON-S) 8.6-50 MG tablet Take 1 tablet by mouth daily.  . tamsulosin (FLOMAX) 0.4 MG CAPS capsule Take 0.4 mg by mouth daily.  Marland Kitchen warfarin (COUMADIN) 5 MG tablet Take 5 mg by mouth at bedtime.    No facility-administered encounter medications on file as of 09/04/2018.     Review of Systems   This is limited secondary to dementia but he is not complaining of any fever or chills increased weakness or shortness of breath  Immunization History  Administered Date(s) Administered  . Hepatitis A, Adult 09/19/2016, 04/18/2018  . Hepatitis B, adult 09/19/2016  . Influenza,inj,Quad PF,6-35 Mos 02/16/2018  . Influenza-Unspecified 03/13/2015, 07/07/2016, 03/03/2017, 03/01/2018  . Pneumococcal Conjugate-13 03/03/2017  . Pneumococcal Polysaccharide-23 07/08/2016, 09/19/2016  . Pneumococcal-Unspecified 07/08/2016  . Tdap 02/23/2017   Pertinent  Health Maintenance Due  Topic Date Due  . URINE MICROALBUMIN  10/04/2018 (Originally 02/10/1961)  . HEMOGLOBIN A1C  11/21/2018  . FOOT EXAM  03/27/2019  . OPHTHALMOLOGY EXAM  2019-04-23  . PNA vac Low Risk Adult (2 of 2 - PPSV23) 09/19/2021  . COLONOSCOPY  04/10/2024   Fall Risk  08/15/2018 04/18/2018 03/05/2018 04/18/2017 02/23/2017  Falls in the past year? 0 0 No No No   Functional Status Survey:   Is afebrile pulse 76 respirations 18 blood pressure 131/74 Vitals:   09/04/18 1142  Weight: 150 lb 6.4 oz (68.2 kg)  Height: 5\' 3"  (1.6 m)   Body mass index is 26.64 kg/m. Physical Exam   In general this is a pleasant elderly male in no distress sitting comfortably in his wheelchair.  His skin is warm and dry he does have  shunt site lower left arm with a positive bruit.  Eyes visual acuity appears grossly intact.  Oropharynx mucous membranes appear fairly moist he does have some food residue on his tongue.  Chest is clear to auscultation with shallow air entry there is no labored breathing.  Heart is regular rate and rhythm with a systolic murmur he does not really have significant lower extremity edema.  Abdomen is soft nontender with positive bowel sounds.  Musculoskeletal  Is able to move all extremities x4 at baseline again he does have shunt lower left arm  with positive bruit.  Neurologic appears grossly intact he does not speak much mainly yes or no.  Psych as noted above.  He is pleasant and cooperative  Labs reviewed: Recent Labs    07/16/18 0430 07/17/18 0416 07/18/18 0341  07/22/18 0523 07/23/18 1600 07/30/18 0635 09/04/18 0330  NA 139 139 140   < > 137 137 135 138  K 3.7 3.5 3.7   < > 3.9 4.1 4.2 2.8*  CL 104 103 105   < > 102 103 97* 100  CO2 22 23 25    < > 24 22 24 29   GLUCOSE 135* 114* 93   < > 146* 160* 90 90  BUN 87* 95* 48*   < > 57* 83* 93* 16  CREATININE 5.94* 6.00* 3.74*   < > 5.88* 7.66* 7.47* 2.64*  CALCIUM 6.4* 6.4* 7.0*   < > 6.8* 6.6* 7.1* 8.3*  MG 2.2 2.1 1.9  --   --   --   --   --   PHOS  --   --   --    < > 4.3 4.3  --  3.0   < > = values in this interval not displayed.   Recent Labs    07/08/18 0809 07/09/18 0437 07/16/18 0430  07/22/18 0523 07/23/18 1600 09/04/18 0330  AST 80* 57* 31  --   --   --   --   ALT 117* 79* 36  --   --   --   --   ALKPHOS 78 78 62  --   --   --   --   BILITOT 0.9 0.8 0.5  --   --   --   --   PROT 6.1* 5.9* 5.5*  --   --   --   --   ALBUMIN 2.3* 2.1* 2.0*   < > 2.0* 2.0* 2.9*   < > = values in this interval not displayed.   Recent Labs    07/16/18 0430 07/17/18 0416 07/18/18 0341  07/21/18 0426 07/23/18 1708 07/30/18 0635 09/04/18 0441  WBC 2.8* 3.1* 6.9   < > 5.1 10.4 6.8  --   NEUTROABS 2.0 2.1 5.7  --   --    --   --   --   HGB 11.3* 11.8* 12.2*   < > 12.4* 12.0* 10.7* 7.4*  HCT 35.4* 37.3* 38.7*   < > 39.1 37.4* 33.6* 23.6*  MCV 90.1 89.9 89.6   < > 90.7 90.1 89.6  --   PLT 89* 103* 127*   < > 117* 121* 94*  --    < > = values in this interval not displayed.   Lab Results  Component Value Date   TSH 3.098 07/09/2018   Lab Results  Component Value Date   HGBA1C 6.5 (H) 05/22/2018   Lab Results  Component Value Date   CHOL 100 05/22/2018   HDL 23 (L) 05/22/2018   LDLCALC 43 05/22/2018   TRIG 170 (H) 05/22/2018   CHOLHDL 4.3 05/22/2018    Significant Diagnostic Results in last 30 days:  No results found.  Assessment/Plan  #1 history of systolic and diastolic CHF with listed ejection fraction of 20-25%- at this point appears stable edema appears minimal his weight is stable he does not complain of shortness of breath or chest pain-he does continue on Toprol.  2.  History of atrial fibrillation and again he continues on Toprol this appears rate controlled he is on Coumadin-  INR is 1.7 on March 26-update INR is pending on April 9-currently on 5 mg a day.  3.  History of hypertension he continues on Toprol-XL recent blood pressures 131/74-100/60- at this point will monitor clinically appears stable.  4.  History of protein calorie malnutrition he is on supplements and weight appears to be stable continues on a dysphagia 2 diet with honey thick liquids there is been no recent aspiration.  5.  3 of hyperlipidemia he is on Lipitor 80 mg a day LDL recently was 43.  6 history of chronic kidney disease he continues on dialysis- I do note his potassium is low at 2.8 today I discussed this with Dr. Lyndel Safe via phone and will give potassium 20 mEq twice daily for 3 days and update a metabolic panel.  We will be conservative with potassium secondary to his renal disease.  7.  History of HIV he is followed by infectious disease continues on Biktarvy and thought to be doing well he did see  Dr.Comer on March 19  #8- history of COPD emphysema continues on [facility] as well as as needed albuterol--appears to be stable.  9 history of sepsis secondary to UTI which required rehospitalization-he appears to have recovered fairly well from this dysuria he has been afebrile- status appears baseline.  10 history of GERD I note he is on Zantac will discontinue this and start Prilosec instead   11 anemia with history of end-stage renal disease-hemoglobin is down somewhat at 7.4 today--will have dialysis notify of low hemoglobin.  12 history of type 2 diabetes this appears stable diet controlled blood sugars run largely in the 80s to mid 100s  [id number]-of note greater than 40 minutes spent assessing patient-reviewing his chart and labs and coordinating and formulating a plan of care for numerous diagnoses note  greater than 50% of time spent coordinating a plan of care with input as noted above

## 2018-09-05 ENCOUNTER — Encounter: Payer: Self-pay | Admitting: Internal Medicine

## 2018-09-05 LAB — PARATHYROID HORMONE, INTACT (NO CA): PTH: 124 pg/mL — ABNORMAL HIGH (ref 15–65)

## 2018-09-05 LAB — VITAMIN D 25 HYDROXY (VIT D DEFICIENCY, FRACTURES): Vit D, 25-Hydroxy: 69.3 ng/mL (ref 30.0–100.0)

## 2018-09-06 ENCOUNTER — Encounter (HOSPITAL_COMMUNITY)
Admission: RE | Admit: 2018-09-06 | Discharge: 2018-09-06 | Disposition: A | Discharge status: Home / Self Care | Payer: Medicare HMO | Source: Skilled Nursing Facility | Attending: Internal Medicine | Admitting: Internal Medicine

## 2018-09-06 DIAGNOSIS — Z7901 Long term (current) use of anticoagulants: Secondary | ICD-10-CM | POA: Insufficient documentation

## 2018-09-06 DIAGNOSIS — A419 Sepsis, unspecified organism: Secondary | ICD-10-CM | POA: Diagnosis present

## 2018-09-06 DIAGNOSIS — I132 Hypertensive heart and chronic kidney disease with heart failure and with stage 5 chronic kidney disease, or end stage renal disease: Secondary | ICD-10-CM | POA: Diagnosis present

## 2018-09-06 LAB — PROTIME-INR
INR: 2.5 — ABNORMAL HIGH (ref 0.8–1.2)
Prothrombin Time: 26.4 seconds — ABNORMAL HIGH (ref 11.4–15.2)

## 2018-09-06 LAB — OCCULT BLOOD X 1 CARD TO LAB, STOOL: Fecal Occult Bld: NEGATIVE

## 2018-09-07 ENCOUNTER — Encounter (HOSPITAL_COMMUNITY)
Admission: RE | Admit: 2018-09-07 | Discharge: 2018-09-07 | Disposition: A | Discharge status: Home / Self Care | Payer: Medicare HMO | Source: Skilled Nursing Facility | Attending: Internal Medicine | Admitting: Internal Medicine

## 2018-09-07 DIAGNOSIS — A419 Sepsis, unspecified organism: Secondary | ICD-10-CM | POA: Diagnosis present

## 2018-09-07 DIAGNOSIS — I132 Hypertensive heart and chronic kidney disease with heart failure and with stage 5 chronic kidney disease, or end stage renal disease: Secondary | ICD-10-CM | POA: Insufficient documentation

## 2018-09-07 DIAGNOSIS — Z7901 Long term (current) use of anticoagulants: Secondary | ICD-10-CM | POA: Diagnosis present

## 2018-09-07 LAB — BASIC METABOLIC PANEL
Anion gap: 8 (ref 5–15)
BUN: 22 mg/dL (ref 8–23)
CO2: 29 mmol/L (ref 22–32)
Calcium: 8.4 mg/dL — ABNORMAL LOW (ref 8.9–10.3)
Chloride: 100 mmol/L (ref 98–111)
Creatinine, Ser: 3.24 mg/dL — ABNORMAL HIGH (ref 0.61–1.24)
GFR calc Af Amer: 22 mL/min — ABNORMAL LOW (ref >60)
GFR calc non Af Amer: 19 mL/min — ABNORMAL LOW (ref >60)
Glucose, Bld: 99 mg/dL (ref 70–99)
Potassium: 3.4 mmol/L — ABNORMAL LOW (ref 3.5–5.1)
Sodium: 137 mmol/L (ref 135–145)

## 2018-09-11 ENCOUNTER — Encounter (HOSPITAL_COMMUNITY)
Admission: RE | Admit: 2018-09-11 | Discharge: 2018-09-11 | Disposition: A | Discharge status: Home / Self Care | Payer: Medicare HMO | Source: Skilled Nursing Facility | Attending: Internal Medicine | Admitting: Internal Medicine

## 2018-09-11 DIAGNOSIS — I639 Cerebral infarction, unspecified: Secondary | ICD-10-CM | POA: Diagnosis not present

## 2018-09-11 DIAGNOSIS — I5022 Chronic systolic (congestive) heart failure: Secondary | ICD-10-CM | POA: Diagnosis present

## 2018-09-11 DIAGNOSIS — I130 Hypertensive heart and chronic kidney disease with heart failure and stage 1 through stage 4 chronic kidney disease, or unspecified chronic kidney disease: Secondary | ICD-10-CM | POA: Insufficient documentation

## 2018-09-11 LAB — BASIC METABOLIC PANEL
Anion gap: 9 (ref 5–15)
BUN: 18 mg/dL (ref 8–23)
CO2: 32 mmol/L (ref 22–32)
Calcium: 8.9 mg/dL (ref 8.9–10.3)
Chloride: 99 mmol/L (ref 98–111)
Creatinine, Ser: 2.48 mg/dL — ABNORMAL HIGH (ref 0.61–1.24)
GFR calc Af Amer: 30 mL/min — ABNORMAL LOW (ref >60)
GFR calc non Af Amer: 26 mL/min — ABNORMAL LOW (ref >60)
Glucose, Bld: 83 mg/dL (ref 70–99)
Potassium: 3.8 mmol/L (ref 3.5–5.1)
Sodium: 140 mmol/L (ref 135–145)

## 2018-09-11 LAB — RENAL FUNCTION PANEL
Albumin: 3.5 g/dL (ref 3.5–5.0)
Anion gap: 9 (ref 5–15)
BUN: 18 mg/dL (ref 8–23)
CO2: 31 mmol/L (ref 22–32)
Calcium: 8.9 mg/dL (ref 8.9–10.3)
Chloride: 99 mmol/L (ref 98–111)
Creatinine, Ser: 2.48 mg/dL — ABNORMAL HIGH (ref 0.61–1.24)
GFR calc Af Amer: 30 mL/min — ABNORMAL LOW (ref >60)
GFR calc non Af Amer: 26 mL/min — ABNORMAL LOW (ref >60)
Glucose, Bld: 84 mg/dL (ref 70–99)
Phosphorus: 2.3 mg/dL — ABNORMAL LOW (ref 2.5–4.6)
Potassium: 3.8 mmol/L (ref 3.5–5.1)
Sodium: 139 mmol/L (ref 135–145)

## 2018-09-11 LAB — IRON AND TIBC
Iron: 43 ug/dL — ABNORMAL LOW (ref 45–182)
Saturation Ratios: 19 % (ref 17.9–39.5)
TIBC: 225 ug/dL — ABNORMAL LOW (ref 250–450)
UIBC: 182 ug/dL

## 2018-09-11 LAB — HEMOGLOBIN AND HEMATOCRIT, BLOOD
HCT: 33.9 % — ABNORMAL LOW (ref 39.0–52.0)
Hemoglobin: 10.3 g/dL — ABNORMAL LOW (ref 13.0–17.0)

## 2018-09-12 LAB — PTH, INTACT AND CALCIUM
Calcium, Total (PTH): 9 mg/dL (ref 8.6–10.2)
PTH: 115 pg/mL — ABNORMAL HIGH (ref 15–65)

## 2018-09-12 LAB — VITAMIN D 25 HYDROXY (VIT D DEFICIENCY, FRACTURES): Vit D, 25-Hydroxy: 44.6 ng/mL (ref 30.0–100.0)

## 2018-09-20 ENCOUNTER — Encounter (HOSPITAL_COMMUNITY)
Admission: RE | Admit: 2018-09-20 | Discharge: 2018-09-20 | Disposition: A | Discharge status: Home / Self Care | Payer: Medicare HMO | Source: Skilled Nursing Facility | Attending: Internal Medicine | Admitting: Internal Medicine

## 2018-09-20 DIAGNOSIS — Z7901 Long term (current) use of anticoagulants: Secondary | ICD-10-CM | POA: Diagnosis not present

## 2018-09-20 LAB — PROTIME-INR
INR: 2.1 — ABNORMAL HIGH (ref 0.8–1.2)
Prothrombin Time: 22.8 seconds — ABNORMAL HIGH (ref 11.4–15.2)

## 2018-09-22 ENCOUNTER — Encounter (HOSPITAL_COMMUNITY)
Admission: RE | Admit: 2018-09-22 | Discharge: 2018-09-22 | Disposition: A | Discharge status: Home / Self Care | Payer: Medicare HMO | Source: Skilled Nursing Facility | Attending: Internal Medicine | Admitting: Internal Medicine

## 2018-09-22 DIAGNOSIS — Z7901 Long term (current) use of anticoagulants: Secondary | ICD-10-CM | POA: Diagnosis not present

## 2018-09-22 LAB — OCCULT BLOOD X 1 CARD TO LAB, STOOL: Fecal Occult Bld: NEGATIVE

## 2018-10-02 ENCOUNTER — Non-Acute Institutional Stay (SKILLED_NURSING_FACILITY): Payer: Medicare HMO | Admitting: Adult Health

## 2018-10-02 ENCOUNTER — Encounter: Payer: Self-pay | Admitting: Adult Health

## 2018-10-02 DIAGNOSIS — K219 Gastro-esophageal reflux disease without esophagitis: Secondary | ICD-10-CM | POA: Diagnosis not present

## 2018-10-02 DIAGNOSIS — N401 Enlarged prostate with lower urinary tract symptoms: Secondary | ICD-10-CM

## 2018-10-02 DIAGNOSIS — K5909 Other constipation: Secondary | ICD-10-CM | POA: Diagnosis not present

## 2018-10-02 DIAGNOSIS — N186 End stage renal disease: Secondary | ICD-10-CM

## 2018-10-02 DIAGNOSIS — D631 Anemia in chronic kidney disease: Secondary | ICD-10-CM | POA: Insufficient documentation

## 2018-10-02 DIAGNOSIS — N138 Other obstructive and reflux uropathy: Secondary | ICD-10-CM

## 2018-10-02 NOTE — Progress Notes (Signed)
Location:   [facility] Room Number: 127 D Place of Service:  SNF (31)   CODE STATUS: DNR  No Known Allergies  Chief Complaint  Patient presents with   Medical Management of Chronic Issues    BPH with obstruction/lower urinary tract symptoms; chronic constipation; gerd without esophagitis.     HPI:  He is a 68 year old long term resident of this [facility] being seen for the management of his chronic illnesses: bph; gerd; constipation. There are no reports of heart burn present; no reports of constipation. There are no reports of uncontrolled; no reports of fevers present.   Past Medical History:  Diagnosis Date   AICD (automatic cardioverter/defibrillator) present    Alcoholism in remission ([place])    Anemia    Aortic insufficiency    Arthritis    At moderate risk for fall    Bell's palsy    Chronic systolic heart failure ([place])    a. EF previously 15-20% with cath in 2014 showing no significant CAD b. EF 20-25% by echo in 06/2018   CKD (chronic kidney disease), stage IV (HCC)    COPD (chronic obstructive pulmonary disease) ([place])    Essential hypertension, benign    HIV disease ([place]) 09/06/2016   Hyperlipidemia    NICM (nonischemic cardiomyopathy) ([place])    Noncompliance with medication regimen    NSVT (nonsustained ventricular tachycardia) (HCC)    Numbness of right jaw    Had a stoke in 01/2013. Numbness is occasional, especially when trying to chew.   OSA (obstructive sleep apnea)    Productive cough 08/2013   With brown sputum    S/P aortic valve replacement with bioprosthetic valve    Stroke ([place]) 01/2013   weakness of right side from CVA   Type 2 diabetes mellitus ([place])    Uses hearing aid 2014   recently received new hearing aids    Past Surgical History:  Procedure Laterality Date   AV FISTULA PLACEMENT Left 07/20/2018   Procedure: INSERTION ARTERIOVENOUS GORTEX GRAFT  LEFT ARM;  Surgeon: Rosetta Posner, MD;   Location: [facility];  Service: Vascular;  Laterality: Left;   BIOPSY N/A 04/10/2014   Procedure: GASTRIC BIOPSY;  Surgeon: Daneil Dolin, MD;  Location: [facility];  Service: Endoscopy;  Laterality: N/A;   BIOPSY  10/12/2015   Procedure: BIOPSY;  Surgeon: Daneil Dolin, MD;  Location[facility];  Service: Endoscopy;;  stomach bx's   CARDIAC DEFIBRILLATOR PLACEMENT  11/12/12   [employer] Inogen MINI ICD implanted in [place] at [place] AV[facility] per Dr Nelly Laurence note   COLONOSCOPY WITH PROPOFOL N/A 04/10/2014   RMR: Colonic Diverticulosis   ESOPHAGOGASTRODUODENOSCOPY (EGD) WITH PROPOFOL N/A 04/10/2014   RMR: Mild erosive reflux esophagitis. Multiple antral polyps likely hyperplastic status post removal by hot snare cautery technique. Diffusely abnormal stomach status post gastric biopsy I suspect some of patients anemaia may be due to intermittent oozing from the stomach. It would be difficult and a risky proposition to attempt complete removal of all of his gastric polyps.   ESOPHAGOGASTRODUODENOSCOPY (EGD) WITH PROPOFOL N/A 10/12/2015   Procedure: ESOPHAGOGASTRODUODENOSCOPY (EGD) WITH PROPOFOL;  Surgeon: Daneil Dolin, MD;  [facility];  Service: Endoscopy;  Laterality: N/A;  [id number] - moved to [facility] Right 07/20/2018   Procedure: EXCHANGE OF DIALYSIS CATHETER RIGHT INTERNAL JUGULAR;  Surgeon: Rosetta Posner, MD;  Location: [facility];  Service: Vascular;  Laterality: Right;   IR FLUORO GUIDE CV LINE RIGHT  07/09/2018   IR US GUIDE VASC ACCESS RIGHT  07/09/2018   POLYPECTOMY N/A 04/10/2014   Procedure: GASTRIC POLYPECTOMY;  Surgeon: Daneil Dolin, MD;  Location: [facility];  Service: Endoscopy;  Laterality: N/A;   RIGHT HEART CATHETERIZATION N/A 02/06/2014   Procedure: RIGHT HEART CATH;  Surgeon: Larey Dresser, MD;  Location: [facility] CATH LAB;  Service: Cardiovascular;  Laterality: N/A;    Social History    Socioeconomic History   Marital status: Single    Spouse name: Not on file   Number of children: Not on file   Years of education: Not on file   Highest education level: Not on file  Occupational History   Occupation: disabled  Social Designer, fashion/clothing strain: Not hard at all   Food insecurity:    Worry: Never true    Inability: Never true   Transportation needs:    Medical: No    Non-medical: No  Tobacco Use   Smoking status: Former Smoker    Types: Cigarettes    Last attempt to quit: 11/05/1993    Years since quitting: 24.9   Smokeless tobacco: Never Used  Substance and Sexual Activity   Alcohol use: Not Currently    Comment: former heavy ETOH, none recently   Drug use: No    Comment: prior history of cocaine use, smoking    Sexual activity: Yes    Birth control/protection: None  Lifestyle   Physical activity:    Days per week: 4 days    Minutes per session: 20 min   Stress: Not at all  Relationships   Social connections:    Talks on phone: Never    Gets together: Once a week    Attends religious service: Never    Active member of club or organization: No    Attends meetings of clubs or organizations: Never    Relationship status: Never married   Intimate partner violence:    Fear of current or ex partner: No    Emotionally abused: No    Physically abused: No    Forced sexual activity: No  Other Topics Concern   Not on file  Social History Narrative   Recently moved from [place] to be with family in [place] (2015).  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  Has h/o polysubstance abuse.  Baseline - able to ambulate, mild cognitive delay, able to toilet and shower himself, occasional use of walker/cane.   2019- resident of [facility] Unable to ambulate unassisted.            Family History  Problem Relation Age of Onset   Kidney disease Mother    Kidney disease Sister    Colon cancer Neg Hx        VITAL SIGNS BP 136/77    Pulse 70    Temp 98.2 F (36.8 C)    Resp 17    Ht 5\' 3"  (1.6 m)    Wt 138 lb 3.2 oz (62.7 kg)    BMI 24.48 kg/m   Outpatient Encounter Medications as of 10/02/2018  Medication Sig   acetaminophen (TYLENOL) 325 MG tablet Take 2 tablets (650 mg total) by mouth every 4 (four) hours as needed for mild pain (or temp > 37.5 C (99.5 F)).   albuterol (PROVENTIL) (2.5 MG/3ML) 0.083% nebulizer solution Take 3 mLs (2.5  mg total) by nebulization every 4 (four) hours as needed for wheezing or shortness of breath.   atorvastatin (LIPITOR) 80 MG tablet Take 80 mg by mouth daily.    bictegravir-emtricitabine-tenofovir AF (BIKTARVY) 50-200-25 MG TABS tablet Take 1 tablet by mouth daily.   calcium carbonate (TUMS EX) 750 MG chewable tablet Chew 1 tablet by mouth 2 (two) times daily.   Cholecalciferol (VITAMIN D3) 2000 UNITS TABS Take 2,000 mg by mouth daily.   coal tar (NEUTROGENA T-GEL) 0.5 % shampoo Apply to scalp on shower days (Twice a week) for dandruff and dry scalp Wed., and Sat.   docusate sodium (COLACE) 100 MG capsule Take 100 mg by mouth daily.   metoprolol succinate (TOPROL-XL) 25 MG 24 hr tablet Take 1 tablet (25 mg total) by mouth daily.   mometasone-formoterol (DULERA) 100-5 MCG/ACT AERO Inhale 2 puffs into the lungs 2 (two) times daily.   multivitamin (RENA-VIT) TABS tablet Take 1 tablet by mouth at bedtime.   NON FORMULARY Diet Type:  Dysphagia 2 diet with Honey Thick Liquids, NAS, Cons CHO   Nutritional Supplements (ENSURE ENLIVE PO) Take 237 mLs by mouth daily.   omeprazole (PRILOSEC) 20 MG capsule Take 20 mg by mouth at bedtime.   senna-docusate (SENEXON-S) 8.6-50 MG tablet Take 1 tablet by mouth daily.   tamsulosin (FLOMAX) 0.4 MG CAPS capsule Take 0.4 mg by mouth daily.   UNABLE TO FIND 1.2L fluid restriction - 360 cc 912 oz) at breakfast, 240 cc (8 oz) at Lunch, 240 cc (8 oz) Dinner, 360 cc (12 oz) med pass / snacks om between meals    warfarin (COUMADIN) 5 MG tablet Take 5 mg by mouth at bedtime.    [DISCONTINUED] feeding supplement, ENSURE ENLIVE, (ENSURE ENLIVE) LIQD Take 237 mLs by mouth 2 (two) times daily between meals. (Patient not taking: Reported on 10/02/2018)   [DISCONTINUED] ranitidine (ZANTAC) 150 MG tablet Take 150 mg by mouth at bedtime.   No [facility]-administered encounter medications on file as of 10/02/2018.      SIGNIFICANT DIAGNOSTIC EXAMS  PREVIOUS:   07-06-18: chest x-ray: Enlargement of cardiac silhouette with vascular congestion and question minimal pulmonary edema.  07-06-18: ct of head;  1. New age indeterminate RIGHT basal ganglia small infarcts. 2. Old RIGHT temporal occipital/PCA territory infarct. 3. Old basal ganglia, thalami, pontine and cerebellar small infarcts. Moderate to severe chronic small vessel ischemic changes. 4. Moderate parenchymal brain volume loss, advanced for age.  07-07-18: US renal: Medical renal disease changes of both kidneys. Small renal cysts in both kidneys. No evidence of hydronephrosis.  07-11-18: ct of head:  1. Stable head CT, with no new acute intracranial abnormality. 2. In band cerebral atrophy with chronic small vessel ischemic disease with multiple chronic ischemic infarcts as above. 3. Acute pan sinusitis, worsened from previous.  05-12-19 ct of abdomen and pelvis: Diverticulosis without diverticulitis. Bilateral renal cysts. Mild bibasilar atelectasis.  07-12-18: 2-d echo:   1. The left ventricle has severely reduced systolic function, with an ejection fraction of 20-25%. The cavity size was mildly dilated. There is mildly increased left ventricular wall thickness. Left ventricular diastolic Doppler parameters are  consistent with impaired relaxation Left ventricular diffuse hypokinesis.  2. The right ventricle has normal systolic function. The cavity was normal. There is no increase in right ventricular wall thickness.  3. Left atrial size was mildly  dilated.  4. The mitral valve is normal in structure. There is mild thickening.  5. The tricuspid valve is normal in  structure.  6. AVR.  7. The pulmonic valve was normal in structure.  8. Severe global reduction in LV systolic function; mild LVH; mild LVE; mild LAE; s/p AVR (not well visualized; mean gradient 12 mmHg; AVA 1.5 cm2 but may be underestimated); no vegetations but cannot R/O with this study; suggest TEE if clinically  indicated.  07-20-18: chest x-ray: Right IJ dialysis catheter in good position without complicating features. Cardiac enlargement and vascular congestion without overt pulmonary edema.  NO NEW EXAMS.    LABS REVIEWED PREVIOUS:   05-22-18: hgb a1c 6.5 chol 100 ldl 43; trig 170; hdl 23 07-04-18: wbc  3.7; hgb 14.0; hct 45.6; mcv 91.8; plt 124; glucose 100; bun 45; creat 2.44; k+ 4.7; na++ 139 BNP 679.0  07-06-18: wbc 1.9; hgb 15.2; hct 48.8; mcv 91.2 plt 107; glucose 136; bun 43; creat 3.01; k+ 5.9; na++ 136; ca 8.8 ast 70 alt 46; albumin 3.0 BNP 554.5   blood culture: no growth /e-coli  07-08-18: wbc 13.8; hgb 13.1; hct 41.1; mcv 93.2 plt 73 glucose 108 ;bun 74; creat 5.32; k+ 5.5; na++ 139; ca 7.5; ast 80 alt 117; albumin 2.3  07-09-18; tsh 3.098 07-17-18: wbc 3.1; hgb 1.8; hct 37.3; mcv 89.9 plt 103 glucose 114; bun 95; creat 6.00; k+ 3.5; na++ 139; ca 6.4 mag 2.1 07-27-18: INR 2.3: coumadin 5 mg daily   07-30-18: wbc  6.8; hgb 10.7; hct 33.6; mcv 89.6; plt 94 glucose 90; bun 93; creat 7.47; k+ 4.2; na++ 135; ca 7.1 08-02-18: INR 1.9   08-15-18: HIV quant: <20 CD4: 360  08-16-18: INR 3.2   TODAY:   08-23-18: INR  1.7 09-04-18: hgb 7.4 hct 23.6  glucose 90; bun 16; create 2.64; k+ 2.8; na++ 138; ca 8.3 phos 3.0; albumin 2.9 iron 52; tibc 218; PTH 124 vit 69.3  09-06-18: INR 2.5 guaiac neg  09-07-18: glucose 99; bun 22; creat 3.24; k+ 3.4;na++ 137; ca 8.4  09-11-18 hgb 10.3; hct 33.9 glucose 83; bun 18; creat 2.48; k+ 3.8; na++ 140; ca 8.9 iron 43; tibc 225 PTH 115 vit D  44.6 09-20-18: INR 2.1 09-22-18: guaiac: neg    Review of Systems  Unable to perform ROS: Dementia (unable to participate )    Physical Exam Constitutional:      General: He is not in acute distress.    Appearance: He is well-developed. He is not diaphoretic.  Neck:     Musculoskeletal: Neck supple.     Thyroid: No thyromegaly.  Cardiovascular:     Rate and Rhythm: Normal rate. Rhythm irregular.     Pulses: Normal pulses.     Heart sounds: Normal heart sounds.     Comments: 2/6 ICD in place  Pulmonary:     Effort: Pulmonary effort is normal. No respiratory distress.     Breath sounds: Normal breath sounds.  Abdominal:     General: Bowel sounds are normal. There is no distension.     Palpations: Abdomen is soft.     Tenderness: There is no abdominal tenderness.  Musculoskeletal:     Right lower leg: No edema.     Left lower leg: No edema.     Comments: Is able to move all extremities   Lymphadenopathy:     Cervical: No cervical adenopathy.  Skin:    General: Skin is warm and dry.     Comments: Left arm arm a/v fistula: + thrill + bruit   Neurological:     General: No focal deficit  present.     Mental Status: He is alert.  Psychiatric:        Mood and Affect: Mood normal.     ASSESSMENT/ PLAN:  TODAY:   1. BPH with obstruction/urinary tract symptoms is stable will continue flomax 0.4 mg daily   2. GERD without esophagitis: is stable will continue prilosec 20 mg daily   3. Chronic constipation: is stable will continue colace and senna s daily   PREVIOUS   4. Essential hypertension: is stable b/p 136/77 will continue toprol xl 25 mg daily   5. PAF (paroxysmal atrial fibrillation); status post bioprosthetic AVR heart rate stable will continue toprol xl 25 mg daily for rate control is on chronic coumadin therapy managed by Dr. Lyndel Safe   6. Dyslipidemia associated with type 2 diabetes mellitus is stable ldl 43 will continue lipitor 80 mg daily   7 Type 2 diabetes  mellitus with hypertension and end stage renal disease on dialysis: is stable hgb a1c 6.5 will continue to monitor his status. .   8. Chronic kidney disease with end stage renal disease on dialysis due to type 2 diabetes mellitus/dialysis dependent: is without change is being dialyzed three times weekly is follow by nephrology. Is on 1200 cc fluid restriction   9. Other emphysema: is stable will continue dulera 100/5 mcg 2 puffs twice daily and has albuterol neb every 4 hours as needed  10. HIV disease: is without change will continue Biktarvy 20-200-25 mg daily is followed by I/D  11. CVA/Mulitple cva: is neurologically stable will continue to monitor is on chronic coumadin therapy   12. Anemia due to end stage renal disease: is stable hgb 10.3 will continue to monitor   13. 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 Protein calorie malnutrition severe: is without change: albumin 2.9 (previous 2.3)  weight is (previous 149) pounds will continue supplements as directed  15. 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 We will attempt to wean resident as apropriate   Ok Edwards NP [facility]  Contact [phone] Monday through Friday 8am- 5pm  After hours call [phone]

## 2018-10-11 ENCOUNTER — Encounter (HOSPITAL_COMMUNITY)
Admission: RE | Admit: 2018-10-11 | Discharge: 2018-10-11 | Disposition: A | Discharge status: Home / Self Care | Payer: Medicare HMO | Source: Skilled Nursing Facility | Attending: Internal Medicine | Admitting: Internal Medicine

## 2018-10-11 DIAGNOSIS — I132 Hypertensive heart and chronic kidney disease with heart failure and with stage 5 chronic kidney disease, or end stage renal disease: Secondary | ICD-10-CM | POA: Diagnosis not present

## 2018-10-11 DIAGNOSIS — A419 Sepsis, unspecified organism: Secondary | ICD-10-CM | POA: Insufficient documentation

## 2018-10-11 DIAGNOSIS — Z7901 Long term (current) use of anticoagulants: Secondary | ICD-10-CM | POA: Diagnosis present

## 2018-10-11 LAB — PROTIME-INR
INR: 3.2 — ABNORMAL HIGH (ref 0.8–1.2)
Prothrombin Time: 32.6 seconds — ABNORMAL HIGH (ref 11.4–15.2)

## 2018-10-13 ENCOUNTER — Encounter (HOSPITAL_COMMUNITY)
Admission: RE | Admit: 2018-10-13 | Discharge: 2018-10-13 | Disposition: A | Discharge status: Home / Self Care | Payer: Medicare HMO | Source: Skilled Nursing Facility | Attending: Internal Medicine | Admitting: Internal Medicine

## 2018-10-13 DIAGNOSIS — I132 Hypertensive heart and chronic kidney disease with heart failure and with stage 5 chronic kidney disease, or end stage renal disease: Secondary | ICD-10-CM | POA: Diagnosis not present

## 2018-10-13 LAB — PROTIME-INR
INR: 2.4 — ABNORMAL HIGH (ref 0.8–1.2)
Prothrombin Time: 25.7 seconds — ABNORMAL HIGH (ref 11.4–15.2)

## 2018-10-16 ENCOUNTER — Ambulatory Visit: Payer: Medicare HMO | Admitting: Internal Medicine

## 2018-10-19 ENCOUNTER — Encounter (HOSPITAL_COMMUNITY)
Admission: RE | Admit: 2018-10-19 | Discharge: 2018-10-19 | Disposition: A | Discharge status: Home / Self Care | Payer: Medicare HMO | Source: Skilled Nursing Facility | Attending: Internal Medicine | Admitting: Internal Medicine

## 2018-10-19 DIAGNOSIS — I10 Essential (primary) hypertension: Secondary | ICD-10-CM | POA: Insufficient documentation

## 2018-10-19 DIAGNOSIS — I2510 Atherosclerotic heart disease of native coronary artery without angina pectoris: Secondary | ICD-10-CM | POA: Diagnosis not present

## 2018-10-19 DIAGNOSIS — I480 Paroxysmal atrial fibrillation: Secondary | ICD-10-CM | POA: Insufficient documentation

## 2018-10-19 LAB — PROTIME-INR
INR: 2.1 — ABNORMAL HIGH (ref 0.8–1.2)
Prothrombin Time: 23 seconds — ABNORMAL HIGH (ref 11.4–15.2)

## 2018-10-24 ENCOUNTER — Encounter: Payer: Medicare HMO | Admitting: Internal Medicine

## 2018-10-24 ENCOUNTER — Other Ambulatory Visit (HOSPITAL_COMMUNITY)
Admission: AD | Admit: 2018-10-24 | Discharge: 2018-10-24 | Disposition: A | Discharge status: Home / Self Care | Payer: Medicare HMO | Source: Skilled Nursing Facility | Attending: Internal Medicine | Admitting: Internal Medicine

## 2018-10-24 DIAGNOSIS — Z20828 Contact with and (suspected) exposure to other viral communicable diseases: Secondary | ICD-10-CM | POA: Insufficient documentation

## 2018-10-24 DIAGNOSIS — R05 Cough: Secondary | ICD-10-CM | POA: Diagnosis present

## 2018-10-24 LAB — SARS CORONAVIRUS 2 BY RT PCR (HOSPITAL ORDER, PERFORMED IN ~~LOC~~ HOSPITAL LAB): SARS Coronavirus 2: NEGATIVE

## 2018-10-31 ENCOUNTER — Ambulatory Visit (INDEPENDENT_AMBULATORY_CARE_PROVIDER_SITE_OTHER): Payer: Medicare HMO | Admitting: *Deleted

## 2018-10-31 DIAGNOSIS — I428 Other cardiomyopathies: Secondary | ICD-10-CM | POA: Diagnosis not present

## 2018-10-31 LAB — CUP PACEART REMOTE DEVICE CHECK
Battery Remaining Longevity: 30 mo
Battery Remaining Percentage: 54 %
Brady Statistic RV Percent Paced: 0 %
Date Time Interrogation Session: 20200603085100
HighPow Impedance: 52 Ohm
Implantable Lead Implant Date: 20140617
Implantable Lead Location: 753860
Implantable Lead Model: 180
Implantable Lead Serial Number: 310936
Implantable Pulse Generator Implant Date: 20140617
Lead Channel Impedance Value: 386 Ohm
Lead Channel Pacing Threshold Amplitude: 1.3 V
Lead Channel Pacing Threshold Pulse Width: 0.4 ms
Lead Channel Setting Pacing Amplitude: 2.5 V
Lead Channel Setting Pacing Pulse Width: 0.4 ms
Lead Channel Setting Sensing Sensitivity: 0.6 mV
Pulse Gen Serial Number: 436636

## 2018-11-01 ENCOUNTER — Non-Acute Institutional Stay (SKILLED_NURSING_FACILITY): Payer: Medicare HMO | Admitting: Internal Medicine

## 2018-11-01 ENCOUNTER — Encounter: Payer: Self-pay | Admitting: Internal Medicine

## 2018-11-01 DIAGNOSIS — I5042 Chronic combined systolic (congestive) and diastolic (congestive) heart failure: Secondary | ICD-10-CM

## 2018-11-01 DIAGNOSIS — N401 Enlarged prostate with lower urinary tract symptoms: Secondary | ICD-10-CM

## 2018-11-01 DIAGNOSIS — N138 Other obstructive and reflux uropathy: Secondary | ICD-10-CM

## 2018-11-01 DIAGNOSIS — I480 Paroxysmal atrial fibrillation: Secondary | ICD-10-CM

## 2018-11-01 DIAGNOSIS — N186 End stage renal disease: Secondary | ICD-10-CM | POA: Diagnosis not present

## 2018-11-01 DIAGNOSIS — E1169 Type 2 diabetes mellitus with other specified complication: Secondary | ICD-10-CM

## 2018-11-01 DIAGNOSIS — Z992 Dependence on renal dialysis: Secondary | ICD-10-CM

## 2018-11-01 DIAGNOSIS — E785 Hyperlipidemia, unspecified: Secondary | ICD-10-CM

## 2018-11-01 NOTE — Progress Notes (Signed)
Location:  [place] Room Number: [place] 6 of Service:  SNF [phone]) Provider:  Veleta Miners, MD  Virgie Dad, MD  Patient Care Team: Virgie Dad, MD as PCP - General (Internal Medicine) Evans Lance, MD as PCP - Electrophysiology (Cardiology) Harl Bowie Alphonse Guild, MD as PCP - Cardiology (Cardiology) Gala Romney Cristopher Estimable, MD as Consulting Physician (Gastroenterology) Rolm Baptise as Physician Assistant (Internal Medicine)  Extended Emergency Contact Information Primary Emergency Contact: [facility] Address: [address], [place] [address] [place] of [place] Phone: [phone] Mobile Phone: [phone] Relation: Relative Secondary Emergency Contact: [facility] Address: [address]  [address] [place] Phone: [phone] Mobile Phone: [phone] Relation: Sister  Code Status:  DNR Goals of care: Advanced Directive information Advanced Directives 11/01/2018  Does Patient Have a Medical Advance Directive? Yes  Type of Advance Directive Out of facility DNR (pink MOST or yellow form)  Does patient want to make changes to medical advance directive? No - Patient declined  Copy of [place] in Chart? -  Would patient like information on creating a medical advance directive? -  Pre-existing out of facility DNR order (yellow form or pink MOST form) Yellow form placed in chart (order not valid for inpatient use)     Chief Complaint  Patient presents with  . Medical Management of Chronic Issues    Hypertension, CAD  .         HPI:  Pt is a 68 y.o. male seen today for medical management of chronic diseases.    Patient is  resident of [facility] and has  h/o ESRD on Dialysis TTS now, Chronic CHF with Cardiomyopathy EF of 20%, HIV on treatment with Infectious disease. S/P AICD,PAF on Coumadin, hypertension, S/P CVA, Chronic Anemia, Hyperlipidemia,  Diabetes Mellitus, COPD, BPH He also has remote h/o AVR replacement with Bioprosthetic valve  He is Long term Resident of facility and is doing well. No New Complains By Nurses . Patient  Seems to have more Decline in his Cognitive status Tolerating Dialysis No Cough Or fever or SOB   Past Medical History:  Diagnosis Date  . AICD (automatic cardioverter/defibrillator) present   . Alcoholism in remission ([place])   . Anemia   . Aortic insufficiency   . Arthritis   . At moderate risk for fall   . Bell's palsy   . Chronic systolic heart failure (HCC)    a. EF previously 15-20% with cath in 2014 showing no significant CAD b. EF 20-25% by echo in 06/2018  . CKD (chronic kidney disease), stage IV ([place])   . COPD (chronic obstructive pulmonary disease) ([place])   . Essential hypertension, benign   . HIV disease ([place]) 09/06/2016  . Hyperlipidemia   . NICM (nonischemic cardiomyopathy) ([place])   . Noncompliance with medication regimen   . NSVT (nonsustained ventricular tachycardia) ([place])   . Numbness of right jaw    Had a stoke in 01/2013. Numbness is occasional, especially when trying to chew.  . OSA (obstructive sleep apnea)   . Productive cough 08/2013   With brown sputum   . S/P aortic valve replacement with bioprosthetic valve   . Stroke ([place]) 01/2013   weakness of right side from CVA  . Type 2 diabetes mellitus ([place])   . Uses hearing aid 2014   recently  received new hearing aids   Past Surgical History:  Procedure Laterality Date  . AV FISTULA PLACEMENT Left 07/20/2018   Procedure: INSERTION ARTERIOVENOUS GORTEX GRAFT  LEFT ARM;  Surgeon: Rosetta Posner, MD;  Location: [place];  Service: Vascular;  Laterality: Left;  . BIOPSY N/A 04/10/2014   Procedure: GASTRIC BIOPSY;  Surgeon: Daneil Dolin, MD;  Location: [facility];  Service: Endoscopy;  Laterality: N/A;  . BIOPSY  10/12/2015   Procedure: BIOPSY;  Surgeon: Daneil Dolin, MD;  [facility];  Service: Endoscopy;;  stomach bx's   . CARDIAC DEFIBRILLATOR PLACEMENT  11/12/12   [employer] Inogen MINI ICD implanted in [place] at [facility] AVR per Dr Nelly Laurence note  . COLONOSCOPY WITH PROPOFOL N/A 04/10/2014   RMR: Colonic Diverticulosis  . ESOPHAGOGASTRODUODENOSCOPY (EGD) WITH PROPOFOL N/A 04/10/2014   RMR: Mild erosive reflux esophagitis. Multiple antral polyps likely hyperplastic status post removal by hot snare cautery technique. Diffusely abnormal stomach status post gastric biopsy I suspect some of patients anemaia may be due to intermittent oozing from the stomach. It would be difficult and a risky proposition to attempt complete removal of all of his gastric polyps.  . ESOPHAGOGASTRODUODENOSCOPY (EGD) WITH PROPOFOL N/A 10/12/2015   Procedure: ESOPHAGOGASTRODUODENOSCOPY (EGD) WITH PROPOFOL;  Surgeon: Daneil Dolin, MD;  [facility];  Service: Endoscopy;  Laterality: N/A;  2542 - moved to 9:15  . INSERTION OF DIALYSIS CATHETER Right 07/20/2018   Procedure: EXCHANGE OF DIALYSIS CATHETER RIGHT INTERNAL JUGULAR;  Surgeon: Rosetta Posner, MD;  Location: [place];  Service: Vascular;  Laterality: Right;  . IR FLUORO GUIDE CV LINE RIGHT  07/09/2018  . IR US GUIDE VASC ACCESS RIGHT  07/09/2018  . POLYPECTOMY N/A 04/10/2014   Procedure: GASTRIC POLYPECTOMY;  Surgeon: Daneil Dolin, MD;  Location[facility];  Service: Endoscopy;  Laterality: N/A;  . RIGHT HEART CATHETERIZATION N/A 02/06/2014   Procedure: RIGHT HEART CATH;  Surgeon: Larey Dresser, MD;  Location: [facility] CATH LAB;  Service: Cardiovascular;  Laterality: N/A;    No Known Allergies  Outpatient Encounter Medications as of 11/01/2018  Medication Sig  . acetaminophen (TYLENOL) 325 MG tablet Take 2 tablets (650 mg total) by mouth every 4 (four) hours as needed for mild pain (or temp > 37.5 C (99.5 F)).  Marland Kitchen albuterol (PROVENTIL) (2.5 MG/3ML) 0.083% nebulizer solution Take 3 mLs (2.5 mg total) by nebulization every 4  (four) hours as needed for wheezing or shortness of breath.  Marland Kitchen atorvastatin (LIPITOR) 80 MG tablet Take 80 mg by mouth daily.   . bictegravir-emtricitabine-tenofovir AF (BIKTARVY) 50-200-25 MG TABS tablet Take 1 tablet by mouth daily.  . calcium carbonate (TUMS EX) 750 MG chewable tablet Chew 1 tablet by mouth 2 (two) times daily.  . Cholecalciferol (VITAMIN D3) 2000 UNITS TABS Take 2,000 mg by mouth daily.  . coal tar (NEUTROGENA T-GEL) 0.5 % shampoo Apply to scalp on shower days (Twice a week) for dandruff and dry scalp Wed., and Sat.  . docusate sodium (COLACE) 100 MG capsule Take 100 mg by mouth daily.  . metoprolol succinate (TOPROL-XL) 25 MG 24 hr tablet Take 1 tablet (25 mg total) by mouth daily.  . mometasone-formoterol (DULERA) 100-5 MCG/ACT AERO Inhale 2 puffs into the lungs 2 (two) times daily.  . multivitamin (RENA-VIT) TABS tablet Take 1 tablet by mouth at bedtime.  . NON FORMULARY Diet Type:  Dysphagia 2 diet with  Honey Thick Liquids, NAS, Cons CHO  . Nutritional Supplements (FEEDING SUPPLEMENT, NEPRO CARB STEADY,) LIQD Take by mouth. Honey thick - Give by mouth two times daily due to weight loss and increased needs  . omeprazole (PRILOSEC) 20 MG capsule Take 20 mg by mouth at bedtime.  . senna-docusate (SENEXON-S) 8.6-50 MG tablet Take 1 tablet by mouth daily.  . tamsulosin (FLOMAX) 0.4 MG CAPS capsule Take 0.4 mg by mouth daily.  Marland Kitchen UNABLE TO FIND 1.2L fluid restriction - 360 cc 912 oz) at breakfast, 240 cc (8 oz) at Lunch, 240 cc (8 oz) Dinner, 360 cc (12 oz) med pass / snacks om between meals  . warfarin (COUMADIN) 5 MG tablet Take 1 tablet by mouth on Sunday, Monday, Tuesday, Thursday and Friday  . WARFARIN SODIUM PO Take by mouth. Give 4.5 mg by mouth daily on Wednesday and Saturday  . [DISCONTINUED] Nutritional Supplements (ENSURE ENLIVE PO) Take 237 mLs by mouth daily.   No facility-administered encounter medications on file as of 11/01/2018.     Review of Systems  Unable  to perform ROS: Dementia    Immunization History  Administered Date(s) Administered  . Hepatitis A, Adult 09/19/2016, 04/18/2018  . Hepatitis B, adult 09/19/2016  . Influenza,inj,Quad PF,6-35 Mos 02/16/2018  . Influenza-Unspecified 03/13/2015, 07/07/2016, 03/03/2017, 03/01/2018  . Pneumococcal Conjugate-13 03/03/2017  . Pneumococcal Polysaccharide-23 07/08/2016, 09/19/2016  . Pneumococcal-Unspecified 07/08/2016  . Tdap 02/23/2017   Pertinent  Health Maintenance Due  Topic Date Due  . URINE MICROALBUMIN  12/03/2018 (Originally 02/10/1961)  . HEMOGLOBIN A1C  11/21/2018  . FOOT EXAM  03/27/2019  . OPHTHALMOLOGY EXAM  April 20, 2019  . PNA vac Low Risk Adult (2 of 2 - PPSV23) 09/19/2021  . COLONOSCOPY  04/10/2024   Fall Risk  08/15/2018 04/18/2018 03/05/2018 04/18/2017 02/23/2017  Falls in the past year? 0 0 No No No   Functional Status Survey:    Vitals:   11/01/18 0858  Weight: 144 lb 9.6 oz (65.6 kg)  Height: 5\' 3"  (1.6 m)   Body mass index is 25.61 kg/m. Physical Exam Vitals signs reviewed.  HENT:     Head: Normocephalic.     Nose: Nose normal.     Mouth/Throat:     Mouth: Mucous membranes are moist.     Pharynx: Oropharynx is clear.  Eyes:     Pupils: Pupils are equal, round, and reactive to light.  Neck:     Musculoskeletal: Neck supple.  Cardiovascular:     Rate and Rhythm: Normal rate and regular rhythm.     Pulses: Normal pulses.  Pulmonary:     Effort: Pulmonary effort is normal.     Breath sounds: Normal breath sounds.  Abdominal:     General: Abdomen is flat. Bowel sounds are normal.     Palpations: Abdomen is soft.  Musculoskeletal:        General: No swelling.  Skin:    General: Skin is warm and dry.  Neurological:     General: No focal deficit present.     Mental Status: He is alert.     Comments: Was following Simple Commands. Was unable to answer me much except yes or no.  Psychiatric:        Mood and Affect: Mood normal.        Thought Content:  Thought content normal.        Judgment: Judgment normal.     Labs reviewed: Recent Labs    07/16/18 0430 07/17/18 0416 07/18/18 0341  07/23/18 1600  09/04/18 0330 09/07/18 0715 09/11/18 0700  NA 139 139 140   < > 137   < > 138 137 139  140  K 3.7 3.5 3.7   < > 4.1   < > 2.8* 3.4* 3.8  3.8  CL 104 103 105   < > 103   < > 100 100 99  99  CO2 22 23 25    < > 22   < > 29 29 31   32  GLUCOSE 135* 114* 93   < > 160*   < > 90 99 84  83  BUN 87* 95* 48*   < > 83*   < > 16 22 18  18   CREATININE 5.94* 6.00* 3.74*   < > 7.66*   < > 2.64* 3.24* 2.48*  2.48*  CALCIUM 6.4* 6.4* 7.0*   < > 6.6*   < > 8.3* 8.4* 8.9  8.9  9.0  MG 2.2 2.1 1.9  --   --   --   --   --   --   PHOS  --   --   --    < > 4.3  --  3.0  --  2.3*   < > = values in this interval not displayed.   Recent Labs    07/08/18 0809 07/09/18 0437 07/16/18 0430  07/23/18 1600 09/04/18 0330 09/11/18 0700  AST 80* 57* 31  --   --   --   --   ALT 117* 79* 36  --   --   --   --   ALKPHOS 78 78 62  --   --   --   --   BILITOT 0.9 0.8 0.5  --   --   --   --   PROT 6.1* 5.9* 5.5*  --   --   --   --   ALBUMIN 2.3* 2.1* 2.0*   < > 2.0* 2.9* 3.5   < > = values in this interval not displayed.   Recent Labs    07/16/18 0430 07/17/18 0416 07/18/18 0341  07/21/18 0426 07/23/18 1708 07/30/18 0635 09/04/18 0441 09/11/18 0700  WBC 2.8* 3.1* 6.9   < > 5.1 10.4 6.8  --   --   NEUTROABS 2.0 2.1 5.7  --   --   --   --   --   --   HGB 11.3* 11.8* 12.2*   < > 12.4* 12.0* 10.7* 7.4* 10.3*  HCT 35.4* 37.3* 38.7*   < > 39.1 37.4* 33.6* 23.6* 33.9*  MCV 90.1 89.9 89.6   < > 90.7 90.1 89.6  --   --   PLT 89* 103* 127*   < > 117* 121* 94*  --   --    < > = values in this interval not displayed.   Lab Results  Component Value Date   TSH 3.098 07/09/2018   Lab Results  Component Value Date   HGBA1C 6.5 (H) 05/22/2018   Lab Results  Component Value Date   CHOL 100 05/22/2018   HDL 23 (L) 05/22/2018   LDLCALC 43 05/22/2018    TRIG 170 (H) 05/22/2018   CHOLHDL 4.3 05/22/2018    Significant Diagnostic Results in last 30 days:  No results found.  Assessment/Plan . 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% On Beta Blocker Coumadin and Statin Stable with Dialysis  ESRD (end stage renal disease) on dialysis  TTS  Tolerating Dialysis  PAF (  paroxysmal atrial fibrillation) ( Continue Coumadin Dose adjusted for INR 2-3  Type 2 diabetes mellitus  A1C 6.5 in 12/19 Will repeat A1C BS are mostly less then 150  Dyslipidemia  On statin LDL 43 in 12/19 Repeat Lipid Profile  BPH  On Flomax  ICD (implantable cardioverter-defibrillator) in place Follows with Cardiology  HIV disease  Follows with ID on treatment on Biktarvy  Anemia in CKD Hgb Stable Repeat CBC Dementia Continue Supportive Care   Family/ staff Communication:   Labs/tests ordered:  BMP,CBC,LIPID, HBA1c  Total time spent in this patient care encounter was  25_  minutes; greater than 50% of the visit spent counseling staff, reviewing records , Labs and coordinating care for problems addressed at this encounter.

## 2018-11-02 ENCOUNTER — Other Ambulatory Visit (HOSPITAL_COMMUNITY)
Admission: RE | Admit: 2018-11-02 | Discharge: 2018-11-02 | Disposition: A | Discharge status: Home / Self Care | Payer: Medicare HMO | Source: Skilled Nursing Facility | Attending: Internal Medicine | Admitting: Internal Medicine

## 2018-11-02 DIAGNOSIS — Z7901 Long term (current) use of anticoagulants: Secondary | ICD-10-CM | POA: Diagnosis present

## 2018-11-02 LAB — PROTIME-INR
INR: 2.2 — ABNORMAL HIGH (ref 0.8–1.2)
Prothrombin Time: 23.8 seconds — ABNORMAL HIGH (ref 11.4–15.2)

## 2018-11-09 ENCOUNTER — Encounter: Payer: Self-pay | Admitting: Cardiology

## 2018-11-09 NOTE — Progress Notes (Signed)
Remote ICD transmission.   

## 2018-11-13 ENCOUNTER — Other Ambulatory Visit (HOSPITAL_COMMUNITY): Payer: Self-pay | Admitting: Specialist

## 2018-11-13 DIAGNOSIS — R1319 Other dysphagia: Secondary | ICD-10-CM

## 2018-11-14 ENCOUNTER — Non-Acute Institutional Stay (SKILLED_NURSING_FACILITY): Payer: Medicare HMO | Admitting: Adult Health

## 2018-11-14 ENCOUNTER — Encounter: Payer: Self-pay | Admitting: Adult Health

## 2018-11-14 DIAGNOSIS — I120 Hypertensive chronic kidney disease with stage 5 chronic kidney disease or end stage renal disease: Secondary | ICD-10-CM

## 2018-11-14 DIAGNOSIS — I639 Cerebral infarction, unspecified: Secondary | ICD-10-CM | POA: Diagnosis not present

## 2018-11-14 DIAGNOSIS — Z992 Dependence on renal dialysis: Secondary | ICD-10-CM

## 2018-11-14 DIAGNOSIS — E1122 Type 2 diabetes mellitus with diabetic chronic kidney disease: Secondary | ICD-10-CM | POA: Diagnosis not present

## 2018-11-14 DIAGNOSIS — N186 End stage renal disease: Secondary | ICD-10-CM | POA: Diagnosis not present

## 2018-11-14 NOTE — Progress Notes (Signed)
Location:   [facility] Room Number: [place] of Service:  SNF (31)   CODE STATUS: DNR  No Known Allergies  Chief Complaint  Patient presents with   Acute Visit    Care Plan Meeting    HPI:  We have come together for his routine care plan meeting. His BIMS 0/15 depression 0/30. He is having a poor adjustment to his room change; he will be able to go to the other side of the facility to visit with his friends. There are no reports of uncontrolled pain; no changes in his appetite; no insomnia. There are no reports of fevers present; weight is stable.    Past Medical History:  Diagnosis Date   AICD (automatic cardioverter/defibrillator) present    Alcoholism in remission ([place])    Anemia    Aortic insufficiency    Arthritis    At moderate risk for fall    Bell's palsy    Chronic systolic heart failure ([place])    a. EF previously 15-20% with cath in 2014 showing no significant CAD b. EF 20-25% by echo in 06/2018   CKD (chronic kidney disease), stage IV (HCC)    COPD (chronic obstructive pulmonary disease) ([place])    Essential hypertension, benign    HIV disease ([place]) 09/06/2016   Hyperlipidemia    NICM (nonischemic cardiomyopathy) ([place])    Noncompliance with medication regimen    NSVT (nonsustained ventricular tachycardia) (HCC)    Numbness of right jaw    Had a stoke in 01/2013. Numbness is occasional, especially when trying to chew.   OSA (obstructive sleep apnea)    Productive cough 08/2013   With brown sputum    S/P aortic valve replacement with bioprosthetic valve    Stroke ([place]) 01/2013   weakness of right side from CVA   Type 2 diabetes mellitus ([place])    Uses hearing aid 2014   recently received new hearing aids    Past Surgical History:  Procedure Laterality Date   AV FISTULA PLACEMENT Left 07/20/2018   Procedure: INSERTION ARTERIOVENOUS GORTEX GRAFT  LEFT ARM;  Surgeon: Rosetta Posner, MD;  Location: [facility];   Service: Vascular;  Laterality: Left;   BIOPSY N/A 04/10/2014   Procedure: GASTRIC BIOPSY;  Surgeon: Daneil Dolin, MD;  Location: [facility];  Service: Endoscopy;  Laterality: N/A;   BIOPSY  10/12/2015   Procedure: BIOPSY;  Surgeon: Daneil Dolin, MD;  [facility];  Service: Endoscopy;;  stomach bx's   CARDIAC DEFIBRILLATOR PLACEMENT  11/12/12   [employer] Inogen MINI ICD implanted in [place] at [facility] per Dr Nelly Laurence note   COLONOSCOPY WITH PROPOFOL N/A 04/10/2014   RMR: Colonic Diverticulosis   ESOPHAGOGASTRODUODENOSCOPY (EGD) WITH PROPOFOL N/A 04/10/2014   RMR: Mild erosive reflux esophagitis. Multiple antral polyps likely hyperplastic status post removal by hot snare cautery technique. Diffusely abnormal stomach status post gastric biopsy I suspect some of patients anemaia may be due to intermittent oozing from the stomach. It would be difficult and a risky proposition to attempt complete removal of all of his gastric polyps.   ESOPHAGOGASTRODUODENOSCOPY (EGD) WITH PROPOFOL N/A 10/12/2015   Procedure: ESOPHAGOGASTRODUODENOSCOPY (EGD) WITH PROPOFOL;  Surgeon: Daneil Dolin, MD;  [facility];  Service: Endoscopy;  Laterality: N/A;  0845 - moved to [place] Right 07/20/2018   Procedure: EXCHANGE OF DIALYSIS CATHETER RIGHT  INTERNAL JUGULAR;  Surgeon: Rosetta Posner, MD;  Location: [facility];  Service: Vascular;  Laterality: Right;   IR FLUORO GUIDE CV LINE RIGHT  07/09/2018   IR US GUIDE VASC ACCESS RIGHT  07/09/2018   POLYPECTOMY N/A 04/10/2014   Procedure: GASTRIC POLYPECTOMY;  Surgeon: Daneil Dolin, MD;  Location: [facility];  Service: Endoscopy;  Laterality: N/A;   RIGHT HEART CATHETERIZATION N/A 02/06/2014   Procedure: RIGHT HEART CATH;  Surgeon: Larey Dresser, MD;  Location: [facility] CATH LAB;  Service: Cardiovascular;  Laterality: N/A;    Social History   Socioeconomic  History   Marital status: Single    Spouse name: Not on file   Number of children: Not on file   Years of education: Not on file   Highest education level: Not on file  Occupational History   Occupation: disabled  Social Designer, fashion/clothing strain: Not hard at all   Food insecurity    Worry: Never true    Inability: Never true   Transportation needs    Medical: No    Non-medical: No  Tobacco Use   Smoking status: Former Smoker    Types: Cigarettes    Quit date: 11/05/1993    Years since quitting: 25.0   Smokeless tobacco: Never Used  Substance and Sexual Activity   Alcohol use: Not Currently    Comment: former heavy ETOH, none recently   Drug use: No    Comment: prior history of cocaine use, smoking    Sexual activity: Yes    Birth control/protection: None  Lifestyle   Physical activity    Days per week: 4 days    Minutes per session: 20 min   Stress: Not at all  Relationships   Social connections    Talks on phone: Never    Gets together: Once a week    Attends religious service: Never    Active member of club or organization: No    Attends meetings of clubs or organizations: Never    Relationship status: Never married   Intimate partner violence    Fear of current or ex partner: No    Emotionally abused: No    Physically abused: No    Forced sexual activity: No  Other Topics Concern   Not on file  Social History Narrative   Recently moved from [place] to be with family in [place] (2015).  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  Has h/o polysubstance abuse.  Baseline - able to ambulate, mild cognitive delay, able to toilet and shower himself, occasional use of walker/cane.   2019- resident of [facility]. Unable to ambulate unassisted.            Family History  Problem Relation Age of Onset   Kidney disease Mother    Kidney disease Sister    Colon cancer Neg Hx       VITAL SIGNS BP 101/68     Pulse 64    Temp 98.1 F (36.7 C)    Resp 19    Ht 5\' 3"  (1.6 m)    Wt 144 lb 9.6 oz (65.6 kg)    SpO2 95%    BMI 25.61 kg/m   Outpatient Encounter Medications as of 11/14/2018  Medication Sig   acetaminophen (TYLENOL) 325 MG tablet Take 2 tablets (650 mg total) by mouth every 4 (four) hours as needed for mild pain (or temp > 37.5 C (99.5 F)).  albuterol (PROVENTIL) (2.5 MG/3ML) 0.083% nebulizer solution Take 3 mLs (2.5 mg total) by nebulization every 4 (four) hours as needed for wheezing or shortness of breath.   atorvastatin (LIPITOR) 80 MG tablet Take 80 mg by mouth daily.    bictegravir-emtricitabine-tenofovir AF (BIKTARVY) 50-200-25 MG TABS tablet Take 1 tablet by mouth daily.   calcium carbonate (TUMS EX) 750 MG chewable tablet Chew 1 tablet by mouth 2 (two) times daily.   Cholecalciferol (VITAMIN D3) 2000 UNITS TABS Take 2,000 mg by mouth daily.   coal tar (NEUTROGENA T-GEL) 0.5 % shampoo Apply to scalp on shower days (Twice a week) for dandruff and dry scalp Wed., and Sat.   docusate sodium (COLACE) 100 MG capsule Take 100 mg by mouth daily.   metoprolol succinate (TOPROL-XL) 25 MG 24 hr tablet Take 1 tablet (25 mg total) by mouth daily.   mometasone-formoterol (DULERA) 100-5 MCG/ACT AERO Inhale 2 puffs into the lungs 2 (two) times daily.   multivitamin (RENA-VIT) TABS tablet Take 1 tablet by mouth at bedtime.   NON FORMULARY Diet Type:  Dysphagia 2 diet with Honey Thick Liquids, NAS, Cons CHO   senna-docusate (SENEXON-S) 8.6-50 MG tablet Take 1 tablet by mouth daily.   tamsulosin (FLOMAX) 0.4 MG CAPS capsule Take 0.4 mg by mouth daily.   UNABLE TO FIND 1.2L fluid restriction - 360 cc 912 oz) at breakfast, 240 cc (8 oz) at Lunch, 240 cc (8 oz) Dinner, 360 cc (12 oz) med pass / snacks om between meals   warfarin (COUMADIN) 5 MG tablet Take 1 tablet by mouth on Sunday, Monday, Tuesday, Thursday and Friday   WARFARIN SODIUM PO Take by mouth. Give 4.5 mg by mouth daily on  Wednesday and Saturday   omeprazole (PRILOSEC) 20 MG capsule Take 20 mg by mouth at bedtime.   [DISCONTINUED] Nutritional Supplements (FEEDING SUPPLEMENT, NEPRO CARB STEADY,) LIQD Take by mouth. Honey thick - Give by mouth two times daily due to weight loss and increased needs   No facility-administered encounter medications on file as of 11/14/2018.      SIGNIFICANT DIAGNOSTIC EXAMS  PREVIOUS:   07-06-18: chest x-ray: Enlargement of cardiac silhouette with vascular congestion and question minimal pulmonary edema.  07-06-18: ct of head;  1. New age indeterminate RIGHT basal ganglia small infarcts. 2. Old RIGHT temporal occipital/PCA territory infarct. 3. Old basal ganglia, thalami, pontine and cerebellar small infarcts. Moderate to severe chronic small vessel ischemic changes. 4. Moderate parenchymal brain volume loss, advanced for age.  07-07-18: US renal: Medical renal disease changes of both kidneys. Small renal cysts in both kidneys. No evidence of hydronephrosis.  07-11-18: ct of head:  1. Stable head CT, with no new acute intracranial abnormality. 2. In band cerebral atrophy with chronic small vessel ischemic disease with multiple chronic ischemic infarcts as above. 3. Acute pan sinusitis, worsened from previous.  05-12-19 ct of abdomen and pelvis: Diverticulosis without diverticulitis. Bilateral renal cysts. Mild bibasilar atelectasis.  07-12-18: 2-d echo:   1. The left ventricle has severely reduced systolic function, with an ejection fraction of 20-25%. The cavity size was mildly dilated. There is mildly increased left ventricular wall thickness. Left ventricular diastolic Doppler parameters are  consistent with impaired relaxation Left ventricular diffuse hypokinesis.  2. The right ventricle has normal systolic function. The cavity was normal. There is no increase in right ventricular wall thickness.  3. Left atrial size was mildly dilated.  4. The mitral valve is normal in  structure. There is mild thickening.  5. The tricuspid valve is normal in structure.  6. AVR.  7. The pulmonic valve was normal in structure.  8. Severe global reduction in LV systolic function; mild LVH; mild LVE; mild LAE; s/p AVR (not well visualized; mean gradient 12 mmHg; AVA 1.5 cm2 but may be underestimated); no vegetations but cannot R/O with this study; suggest TEE if clinically  indicated.  07-20-18: chest x-ray: Right IJ dialysis catheter in good position without complicating features. Cardiac enlargement and vascular congestion without overt pulmonary edema.  NO NEW EXAMS.    LABS REVIEWED PREVIOUS:   05-22-18: hgb a1c 6.5 chol 100 ldl 43; trig 170; hdl 23 07-04-18: wbc  3.7; hgb 14.0; hct 45.6; mcv 91.8; plt 124; glucose 100; bun 45; creat 2.44; k+ 4.7; na++ 139 BNP 679.0  07-06-18: wbc 1.9; hgb 15.2; hct 48.8; mcv 91.2 plt 107; glucose 136; bun 43; creat 3.01; k+ 5.9; na++ 136; ca 8.8 ast 70 alt 46; albumin 3.0 BNP 554.5   blood culture: no growth /e-coli  07-08-18: wbc 13.8; hgb 13.1; hct 41.1; mcv 93.2 plt 73 glucose 108 ;bun 74; creat 5.32; k+ 5.5; na++ 139; ca 7.5; ast 80 alt 117; albumin 2.3  07-09-18; tsh 3.098 07-17-18: wbc 3.1; hgb 1.8; hct 37.3; mcv 89.9 plt 103 glucose 114; bun 95; creat 6.00; k+ 3.5; na++ 139; ca 6.4 mag 2.1 07-27-18: INR 2.3: coumadin 5 mg daily   07-30-18: wbc  6.8; hgb 10.7; hct 33.6; mcv 89.6; plt 94 glucose 90; bun 93; creat 7.47; k+ 4.2; na++ 135; ca 7.1 08-02-18: INR 1.9   08-15-18: HIV quant: <20 CD4: 360  08-16-18: INR 3.2  08-23-18: INR  1.7 09-04-18: hgb 7.4 hct 23.6  glucose 90; bun 16; create 2.64; k+ 2.8; na++ 138; ca 8.3 phos 3.0; albumin 2.9 iron 52; tibc 218; PTH 124 vit 69.3  09-06-18: INR 2.5 guaiac neg  09-07-18: glucose 99; bun 22; creat 3.24; k+ 3.4;na++ 137; ca 8.4  09-11-18 hgb 10.3; hct 33.9 glucose 83; bun 18; creat 2.48; k+ 3.8; na++ 140; ca 8.9 iron 43; tibc 225 PTH 115 vit D 44.6 09-20-18: INR 2.1 09-22-18: guaiac: neg   TODAY:    10-13-18: INR 2.4 10-19-18: INR 2.1 11-02-18: INR 2.2    Review of Systems  Unable to perform ROS: Dementia (unable to participate )    Physical Exam Constitutional:      General: He is not in acute distress.    Appearance: He is well-developed. He is not diaphoretic.  Neck:     Musculoskeletal: Neck supple.     Thyroid: No thyromegaly.  Cardiovascular:     Rate and Rhythm: Normal rate and regular rhythm.     Pulses: Normal pulses.     Heart sounds: Murmur present.     Comments: 2/6 ICD in place Pulmonary:     Effort: Pulmonary effort is normal. No respiratory distress.     Breath sounds: Normal breath sounds.  Abdominal:     General: Bowel sounds are normal. There is no distension.     Palpations: Abdomen is soft.     Tenderness: There is no abdominal tenderness.  Musculoskeletal:     Right lower leg: No edema.     Left lower leg: No edema.     Comments: Is able to move all extremities   Lymphadenopathy:     Cervical: No cervical adenopathy.  Skin:    General: Skin is warm and dry.     Comments: Left arm arm a/v  fistula: + thrill + bruit    Neurological:     Mental Status: He is alert. Mental status is at baseline.  Psychiatric:        Mood and Affect: Mood normal.       ASSESSMENT/ PLAN:  TODAY:   1. Type 2 diabetes mellitus with hypertension and end stage renal disease on dialysis  2. Chronic kidney disease with end stage renal disease on dialysis due to type 2 diabetes mellitus 3. CVA   Will continue current medications Will continue current plan of care Will continue to monitor his status.      MD is aware of resident's narcotic use and is in agreement with current plan of care. We will attempt to wean resident as apropriate   Ok Edwards NP [facility]  Contact [phone] Monday through Friday 8am- 5pm  After hours call [phone]

## 2018-11-16 ENCOUNTER — Other Ambulatory Visit (HOSPITAL_COMMUNITY)
Admission: RE | Admit: 2018-11-16 | Discharge: 2018-11-16 | Disposition: A | Discharge status: Home / Self Care | Payer: Medicare HMO | Source: Skilled Nursing Facility | Attending: Internal Medicine | Admitting: Internal Medicine

## 2018-11-16 DIAGNOSIS — Z7901 Long term (current) use of anticoagulants: Secondary | ICD-10-CM | POA: Diagnosis present

## 2018-11-16 LAB — PROTIME-INR
INR: 3.8 — ABNORMAL HIGH (ref 0.8–1.2)
Prothrombin Time: 36.8 seconds — ABNORMAL HIGH (ref 11.4–15.2)

## 2018-11-19 ENCOUNTER — Ambulatory Visit (HOSPITAL_COMMUNITY)
Admission: RE | Admit: 2018-11-19 | Discharge: 2018-11-19 | Disposition: A | Discharge status: Home / Self Care | Payer: Medicare HMO | Source: Ambulatory Visit | Attending: Internal Medicine | Admitting: Internal Medicine

## 2018-11-19 ENCOUNTER — Ambulatory Visit (HOSPITAL_COMMUNITY): Payer: Medicare HMO | Attending: Internal Medicine | Admitting: Speech Pathology

## 2018-11-19 ENCOUNTER — Encounter (HOSPITAL_COMMUNITY): Payer: Self-pay | Admitting: Speech Pathology

## 2018-11-19 ENCOUNTER — Other Ambulatory Visit: Payer: Self-pay

## 2018-11-19 ENCOUNTER — Encounter (HOSPITAL_COMMUNITY)
Admission: AD | Admit: 2018-11-19 | Discharge: 2018-11-19 | Disposition: A | Discharge status: Home / Self Care | Payer: Medicare HMO | Source: Skilled Nursing Facility | Attending: Internal Medicine | Admitting: Internal Medicine

## 2018-11-19 DIAGNOSIS — Z7901 Long term (current) use of anticoagulants: Secondary | ICD-10-CM | POA: Insufficient documentation

## 2018-11-19 DIAGNOSIS — R1312 Dysphagia, oropharyngeal phase: Secondary | ICD-10-CM | POA: Insufficient documentation

## 2018-11-19 DIAGNOSIS — A419 Sepsis, unspecified organism: Secondary | ICD-10-CM | POA: Diagnosis present

## 2018-11-19 DIAGNOSIS — I132 Hypertensive heart and chronic kidney disease with heart failure and with stage 5 chronic kidney disease, or end stage renal disease: Secondary | ICD-10-CM | POA: Insufficient documentation

## 2018-11-19 DIAGNOSIS — R1319 Other dysphagia: Secondary | ICD-10-CM | POA: Diagnosis not present

## 2018-11-19 LAB — PROTIME-INR
INR: 2.2 — ABNORMAL HIGH (ref 0.8–1.2)
Prothrombin Time: 24.4 seconds — ABNORMAL HIGH (ref 11.4–15.2)

## 2018-11-19 NOTE — Therapy (Signed)
Keith Young, [place], [address] Phone: [phone]   Fax:  [phone]  Modified Barium Swallow  Patient Details  Name: Keith Young MRN: [id number] Date of Birth: 1950-08-03 No data recorded  Encounter Date: 11/19/2018  End of Session - 11/19/18 1233    Visit Number  1    Number of Visits  1    Authorization Type  [employer]    SLP Start Time  1100    SLP Stop Time   1127    SLP Time Calculation (min)  27 min    Activity Tolerance  Patient tolerated treatment well       Past Medical History:  Diagnosis Date  . AICD (automatic cardioverter/defibrillator) present   . Alcoholism in remission ([place])   . Anemia   . Aortic insufficiency   . Arthritis   . At moderate risk for fall   . Bell's palsy   . Chronic systolic heart failure (HCC)    a. EF previously 15-20% with cath in 2014 showing no significant CAD b. EF 20-25% by echo in 06/2018  . CKD (chronic kidney disease), stage IV ([place])   . COPD (chronic obstructive pulmonary disease) ([place])   . Essential hypertension, benign   . HIV disease ([place]) 09/06/2016  . Hyperlipidemia   . NICM (nonischemic cardiomyopathy) ([place])   . Noncompliance with medication regimen   . NSVT (nonsustained ventricular tachycardia) ([place])   . Numbness of right jaw    Had a stoke in 01/2013. Numbness is occasional, especially when trying to chew.  . OSA (obstructive sleep apnea)   . Productive cough 08/2013   With brown sputum   . S/P aortic valve replacement with bioprosthetic valve   . Stroke ([place]) 01/2013   weakness of right side from CVA  . Type 2 diabetes mellitus ([place])   . Uses hearing aid 2014   recently received new hearing aids    Past Surgical History:  Procedure Laterality Date  . AV FISTULA PLACEMENT Left 07/20/2018   Procedure: INSERTION ARTERIOVENOUS GORTEX GRAFT  LEFT ARM;  Surgeon: Rosetta Posner, MD;  Location: [facility];  Service: Vascular;  Laterality: Left;  .  BIOPSY N/A 04/10/2014   Procedure: GASTRIC BIOPSY;  Surgeon: Daneil Dolin, MD;  Location: [facility];  Service: Endoscopy;  Laterality: N/A;  . BIOPSY  10/12/2015   Procedure: BIOPSY;  Surgeon: Daneil Dolin, MD;  [facility];  Service: Endoscopy;;  stomach bx's  . CARDIAC DEFIBRILLATOR PLACEMENT  11/12/12   [employer] Inogen MINI ICD implanted in [place] at [place] AVR per Dr Nelly Laurence note  . COLONOSCOPY WITH PROPOFOL N/A 04/10/2014   RMR: Colonic Diverticulosis  . ESOPHAGOGASTRODUODENOSCOPY (EGD) WITH PROPOFOL N/A 04/10/2014   RMR: Mild erosive reflux esophagitis. Multiple antral polyps likely hyperplastic status post removal by hot snare cautery technique. Diffusely abnormal stomach status post gastric biopsy I suspect some of patients anemaia may be due to intermittent oozing from the stomach. It would be difficult and a risky proposition to attempt complete removal of all of his gastric polyps.  . ESOPHAGOGASTRODUODENOSCOPY (EGD) WITH PROPOFOL N/A 10/12/2015   Procedure: ESOPHAGOGASTRODUODENOSCOPY (EGD) WITH PROPOFOL;  Surgeon: Daneil Dolin, MD;  [facility];  Service: Endoscopy;  Laterality: N/A;  0350 - moved to 9:15  . INSERTION OF DIALYSIS CATHETER Right 07/20/2018   Procedure: EXCHANGE OF DIALYSIS CATHETER RIGHT INTERNAL  JUGULAR;  Surgeon: Rosetta Posner, MD;  Location: [facility] OR;  Service: Vascular;  Laterality: Right;  . IR FLUORO GUIDE CV LINE RIGHT  07/09/2018  . IR US GUIDE VASC ACCESS RIGHT  07/09/2018  . POLYPECTOMY N/A 04/10/2014   Procedure: GASTRIC POLYPECTOMY;  Surgeon: Daneil Dolin, MD;  Location: [facility];  Service: Endoscopy;  Laterality: N/A;  . RIGHT HEART CATHETERIZATION N/A 02/06/2014   Procedure: RIGHT HEART CATH;  Surgeon: Larey Dresser, MD;  Location: [facility] CATH LAB;  Service: Cardiovascular;  Laterality: N/A;    There were no vitals filed for this visit.  Subjective Assessment - 11/19/18  1159    Subjective  Pt with limited conversation    Patient is accompained by:  --   treating SLP   Currently in Pain?  No/denies          General - 11/19/18 1200      General Information   Date of Onset  07/09/18    HPI  Keith Young is a 68 y.o. male who [facility] He was referred for MBSS by Dr. Veleta Miners due to dysphagia. He had MBSS during acute stay at [facility] (07/14/18) with recommendation for D2 and thin with chin tuck, however changed to HTL due to poor implementation of chin tuck. He has h/o ESRD on Dialysis TTS now, Chronic CHF with Cardiomyopathy EF of 20%, HIV on treatment with Infectious disease. S/P AICD,PAF on Coumadin, hypertension, S/P CVA, Chronic Anemia, Hyperlipidemia, Diabetes Mellitus, COPD, BPH. He also has remote h/o AVR replacement with Bioprosthetic valve.   Type of Study  MBS-Modified Barium Swallow Study    Previous Swallow Assessment  07/14/18 D2/thin with chin tuck; changed to HTL    Diet Prior to this Study  Dysphagia 2 (chopped);Honey-thick liquids    Temperature Spikes Noted  No    Respiratory Status  Room air    History of Recent Intubation  No    Behavior/Cognition  Alert;Cooperative;Pleasant mood    Oral Cavity Assessment  Within Functional Limits    Oral Care Completed by SLP  No    Oral Cavity - Dentition  Edentulous    Self-Feeding Abilities  Able to feed self    Patient Positioning  Upright in chair    Baseline Vocal Quality  Normal    Volitional Cough  Strong    Volitional Swallow  Able to elicit    Anatomy  Within functional limits    Pharyngeal Secretions  Not observed secondary MBS         Oral Preparation/Oral Phase - 11/19/18 1207      Oral Preparation/Oral Phase   Oral Phase  Impaired      Oral - Nectar   Oral - Nectar Teaspoon  Decreased bolus cohesion;Piecemeal swallowing    Oral - Nectar Cup  Piecemeal swallowing;Decreased bolus cohesion      Oral - Thin   Oral - Thin Teaspoon  Decreased bolus  cohesion    Oral - Thin Cup  Decreased bolus cohesion    Oral - Thin Straw  Oral residue      Oral - Solids   Oral - Puree  Decreased bolus cohesion;Piecemeal swallowing;Oral residue    Oral - Mechanical Soft  Decreased bolus cohesion;Oral residue;Piecemeal swallowing    Oral - Pill  Within functional limits      Electrical stimulation - Oral Phase   Was Electrical Stimulation Used  No       Pharyngeal Phase - 11/19/18  1211      Pharyngeal Phase   Pharyngeal Phase  Impaired      Pharyngeal - Nectar   Pharyngeal- Nectar Teaspoon  Delayed swallow initiation;Swallow initiation at vallecula;Pharyngeal residue - valleculae    Pharyngeal- Nectar Cup  Swallow initiation at pyriform sinus;Pharyngeal residue - valleculae    Pharyngeal  Material does not enter airway;Material enters airway, passes BELOW cords and not ejected out despite cough attempt by patient      Pharyngeal - Thin   Pharyngeal- Thin Teaspoon  Swallow initiation at pyriform sinus;Pharyngeal residue - valleculae    Pharyngeal  Material does not enter airway    Pharyngeal- Thin Cup  Swallow initiation at pyriform sinus;Penetration/Aspiration during swallow;Pharyngeal residue - valleculae    Pharyngeal  Material does not enter airway;Material enters airway, remains ABOVE vocal cords then ejected out    Pharyngeal- Thin Straw  Swallow initiation at pyriform sinus;Reduced airway/laryngeal closure;Penetration/Aspiration during swallow;Trace aspiration;Pharyngeal residue - valleculae    Pharyngeal  Material does not enter airway;Material enters airway, passes BELOW cords and not ejected out despite cough attempt by patient   delayed coughing     Pharyngeal - Solids   Pharyngeal- Puree  Swallow initiation at vallecula;Delayed swallow initiation;Pharyngeal residue - valleculae    Pharyngeal- Mechanical Soft  Swallow initiation at vallecula    Pharyngeal- Pill  --   penetrated thin during the swallow     Electrical Stimulation -  Pharyngeal Phase   Was Electrical Stimulation Used  No       Cricopharyngeal Phase - 11/19/18 1231      Cervical Esophageal Phase   Cervical Esophageal Phase  Within functional limits        Plan - 11/19/18 1235    Clinical Impression Statement Pt presents with mild oropharyngeal phase dysphagia characterized by lingual pumping, decreased bolus cohesion, piecemeal deglutition and premature spillage with delay in swallow initiation (swallow trigger after spilling to pyriforms with liquids). Hyolaryngeal excursion appears adequate. Pt with variable flash penetration of thins during the swallow which was expelled and aspiration of thins during sequential straw sips (due to bolus overload in pharynx) which resulted in a very delayed strong cough (not removed). Pt benefited from repeat/dry swallows to clear vallecular pooling that occurred post swallow from oral residuals spilling down (he did this spontaneously). Recommend D2 and thin liquids with no straws and cue to take single cup sips with multiple dry swallows to clear BOT/vallecular residuals; po medications whole with cup thin or in puree.    Treatment/Interventions  Aspiration precaution training    Consulted and Agree with Plan of Care  Patient       Patient will benefit from skilled therapeutic intervention in order to improve the following deficits and impairments:   1. Dysphagia, oropharyngeal phase       Recommendations/Treatment - 11/19/18 1231      Swallow Evaluation Recommendations   SLP Diet Recommendations  Thin;Dysphagia 2 (chopped)    Liquid Administration via  Cup;No straw    Medication Administration  Whole meds with liquid    Supervision  Patient able to self feed    Compensations  Small sips/bites;Multiple dry swallows after each bite/sip    Postural Changes  Seated upright at 90 degrees;Remain upright for at least 30 minutes after feeds/meals       Prognosis - 11/19/18 1232      Prognosis   Prognosis for  Safe Diet Advancement  Good    Barriers to Reach Goals  Cognitive deficits  Individuals Consulted   Consulted and Agree with Results and Recommendations  Patient    Report Sent to   Primary SLP;Facility (Comment)       Problem List Patient Active Problem List   Diagnosis Date Noted  . Anemia due to end stage renal disease ([place]) 10/02/2018  . ESRD (end stage renal disease) on dialysis ([place]) 08/06/2018  . PAF (paroxysmal atrial fibrillation) ([place]) 07/31/2018  . Dyslipidemia associated with type 2 diabetes mellitus ([place]) 07/31/2018  . Type 2 diabetes mellitus with hypertension and end stage renal disease on dialysis ([place]) 07/31/2018  . Chronic kidney disease with end stage renal disease on dialysis due to type 2 diabetes mellitus ([place]) 07/31/2018  . Dialysis patient ([place]) 07/31/2018  . BPH with obstruction/lower urinary tract symptoms 07/31/2018  . GERD without esophagitis 07/31/2018  . Chronic constipation 07/31/2018  . 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 Malnutrition of moderate degree 07/20/2018  . Diverticulosis   . Sepsis with encephalopathy and septic shock ([place])   . Tachycardia   . E coli bacteremia   . Septic shock ([place]) 07/07/2018  . Chronic combined systolic and diastolic heart failure ([place]) 12/09/2016  . COPD (chronic obstructive pulmonary disease) ([place]) 09/12/2016  . Protein-calorie malnutrition, severe 09/06/2016  . HIV disease ([place]) 09/06/2016  . CVA (cerebral vascular accident) ([place]) 06/26/2016  . ICD (implantable cardioverter-defibrillator) in place 01/22/2015  . Gastric polyp   . Reflux esophagitis   . Anemia in chronic kidney disease 03/26/2014  . Carotid bruit 12/18/2013  . Essential hypertension, benign 10/23/2013  . CAD (coronary artery disease) 10/23/2013   Thank you,  Genene Churn, [place]  [facility] 11/19/2018, 12:37 PM  [facility] [address] [place], [place],  [address] Phone: [phone]   Fax:  [phone]  Name: TAIWO FISH MRN: [id number] Date of Birth: 1950-06-13

## 2018-11-26 ENCOUNTER — Encounter (HOSPITAL_COMMUNITY)
Admission: AD | Admit: 2018-11-26 | Discharge: 2018-11-26 | Disposition: A | Discharge status: Home / Self Care | Payer: Medicare HMO | Source: Ambulatory Visit | Attending: Internal Medicine | Admitting: Internal Medicine

## 2018-11-26 DIAGNOSIS — Z7901 Long term (current) use of anticoagulants: Secondary | ICD-10-CM | POA: Diagnosis not present

## 2018-11-26 LAB — PROTIME-INR
INR: 1.6 — ABNORMAL HIGH (ref 0.8–1.2)
Prothrombin Time: 19.1 seconds — ABNORMAL HIGH (ref 11.4–15.2)

## 2018-12-04 ENCOUNTER — Non-Acute Institutional Stay (SKILLED_NURSING_FACILITY): Payer: Medicare HMO | Admitting: Adult Health

## 2018-12-04 ENCOUNTER — Encounter: Payer: Self-pay | Admitting: Adult Health

## 2018-12-04 DIAGNOSIS — E1169 Type 2 diabetes mellitus with other specified complication: Secondary | ICD-10-CM

## 2018-12-04 DIAGNOSIS — E1122 Type 2 diabetes mellitus with diabetic chronic kidney disease: Secondary | ICD-10-CM

## 2018-12-04 DIAGNOSIS — Z952 Presence of prosthetic heart valve: Secondary | ICD-10-CM | POA: Diagnosis not present

## 2018-12-04 DIAGNOSIS — E785 Hyperlipidemia, unspecified: Secondary | ICD-10-CM

## 2018-12-04 DIAGNOSIS — I480 Paroxysmal atrial fibrillation: Secondary | ICD-10-CM | POA: Diagnosis not present

## 2018-12-04 DIAGNOSIS — N185 Chronic kidney disease, stage 5: Secondary | ICD-10-CM | POA: Insufficient documentation

## 2018-12-04 DIAGNOSIS — I120 Hypertensive chronic kidney disease with stage 5 chronic kidney disease or end stage renal disease: Secondary | ICD-10-CM

## 2018-12-04 NOTE — Progress Notes (Signed)
Location:   [place] Room Number: [place] of Service:  SNF (31)   CODE STATUS: DNR  No Known Allergies  Chief Complaint  Patient presents with  . Medical Management of Chronic Issues        Hypertension associated with stage 5 chronic kidney disease due to type 2 diabetes mellitus:PAF (paroxysmal atrial fibrillation) status post bioprosthetic AVR  Dyslipidemia associated with type 2 diabetes mellitus:    HPI:  He is a 68 year old long term resident of this facility being seen for the management of her chronic illnesses; hypertension; paf; dyslipidemia. There are no reports of any changes in appetite; no uncontrolled pain; no agitation or anxiety.    Past Medical History:  Diagnosis Date  . AICD (automatic cardioverter/defibrillator) present   . Alcoholism in remission ([place])   . Anemia   . Aortic insufficiency   . Arthritis   . At moderate risk for fall   . Bell's palsy   . Chronic systolic heart failure (HCC)    a. EF previously 15-20% with cath in 2014 showing no significant CAD b. EF 20-25% by echo in 06/2018  . CKD (chronic kidney disease), stage IV ([place])   . COPD (chronic obstructive pulmonary disease) ([place])   . Essential hypertension, benign   . HIV disease ([place]) 09/06/2016  . Hyperlipidemia   . NICM (nonischemic cardiomyopathy) ([place])   . Noncompliance with medication regimen   . NSVT (nonsustained ventricular tachycardia) ([place])   . Numbness of right jaw    Had a stoke in 01/2013. Numbness is occasional, especially when trying to chew.  . OSA (obstructive sleep apnea)   . Productive cough 08/2013   With brown sputum   . S/P aortic valve replacement with bioprosthetic valve   . Stroke ([place]) 01/2013   weakness of right side from CVA  . Type 2 diabetes mellitus ([place])   . Uses hearing aid 2014   recently received new hearing aids    Past Surgical History:  Procedure Laterality Date  . AV FISTULA PLACEMENT Left 07/20/2018   Procedure: INSERTION ARTERIOVENOUS GORTEX GRAFT  LEFT ARM;  Surgeon: Rosetta Posner, MD;  Location: [facility];  Service: Vascular;  Laterality: Left;  . BIOPSY N/A 04/10/2014   Procedure: GASTRIC BIOPSY;  Surgeon: Daneil Dolin, MD;  Location: [facility];  Service: Endoscopy;  Laterality: N/A;  . BIOPSY  10/12/2015   Procedure: BIOPSY;  Surgeon: Daneil Dolin, MD;  [facility];  Service: Endoscopy;;  stomach bx's  . CARDIAC DEFIBRILLATOR PLACEMENT  11/12/12   [employer] Inogen MINI ICD implanted in [place] at [facility] AVR per Dr Nelly Laurence note  . COLONOSCOPY WITH PROPOFOL N/A 04/10/2014   RMR: Colonic Diverticulosis  . ESOPHAGOGASTRODUODENOSCOPY (EGD) WITH PROPOFOL N/A 04/10/2014   RMR: Mild erosive reflux esophagitis. Multiple antral polyps likely hyperplastic status post removal by hot snare cautery technique. Diffusely abnormal stomach status post gastric biopsy I suspect some of patients anemaia may be due to intermittent oozing from the stomach. It would be difficult and a risky proposition to attempt complete removal of all of his gastric polyps.  . ESOPHAGOGASTRODUODENOSCOPY (EGD) WITH PROPOFOL N/A 10/12/2015   Procedure: ESOPHAGOGASTRODUODENOSCOPY (EGD) WITH PROPOFOL;  Surgeon: Daneil Dolin, MD;  [facility];  Service: Endoscopy;  Laterality: N/A;  2263 - moved to 9:15  . INSERTION OF DIALYSIS CATHETER Right 07/20/2018   Procedure:  EXCHANGE OF DIALYSIS CATHETER RIGHT INTERNAL JUGULAR;  Surgeon: Rosetta Posner, MD;  Location: [facility];  Service: Vascular;  Laterality: Right;  . IR FLUORO GUIDE CV LINE RIGHT  07/09/2018  . IR US GUIDE VASC ACCESS RIGHT  07/09/2018  . POLYPECTOMY N/A 04/10/2014   Procedure: GASTRIC POLYPECTOMY;  Surgeon: Daneil Dolin, MD;  Location: [facility];  Service: Endoscopy;  Laterality: N/A;  . RIGHT HEART CATHETERIZATION N/A 02/06/2014   Procedure: RIGHT HEART CATH;  Surgeon: Larey Dresser, MD;   Location: [facility] CATH [facility];  Service: Cardiovascular;  Laterality: N/A;    Social History   Socioeconomic History  . Marital status: Single    Spouse name: Not on file  . Number of children: Not on file  . Years of education: Not on file  . Highest education level: Not on file  Occupational History  . Occupation: disabled  Social Needs  . Financial resource strain: Not hard at all  . Food insecurity    Worry: Never true    Inability: Never true  . Transportation needs    Medical: No    Non-medical: No  Tobacco Use  . Smoking status: Former Smoker    Types: Cigarettes    Quit date: 11/05/1993    Years since quitting: 25.0  . Smokeless tobacco: Never Used  Substance and Sexual Activity  . Alcohol use: Not Currently    Comment: former heavy ETOH, none recently  . Drug use: No    Comment: prior history of cocaine use, smoking   . Sexual activity: Yes    Birth control/protection: None  Lifestyle  . Physical activity    Days per week: 4 days    Minutes per session: 20 min  . Stress: Not at all  Relationships  . Social Herbalist on phone: Never    Gets together: Once a week    Attends religious service: Never    Active member of club or organization: No    Attends meetings of clubs or organizations: Never    Relationship status: Never married  . Intimate partner violence    Fear of current or ex partner: No    Emotionally abused: No    Physically abused: No    Forced sexual activity: No  Other Topics Concern  . Not on file  Social History Narrative   Recently moved from [place] to be with family in [place] (2015).  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  Has h/o polysubstance abuse.  Baseline - able to ambulate, mild cognitive delay, able to toilet and shower himself, occasional use of walker/cane.   2019- resident of [facility]. Unable to ambulate unassisted.            Family History  Problem Relation Age of Onset  .  Kidney disease Mother   . Kidney disease Sister   . Colon cancer Neg Hx       VITAL SIGNS Ht 5\' 3"  (1.6 m)   Wt 142 lb 3.2 oz (64.5 kg)   BMI 25.19 kg/m   Outpatient Encounter Medications as of 12/04/2018  Medication Sig  . acetaminophen (TYLENOL) 325 MG tablet Take 2 tablets (650 mg total) by mouth every 4 (four) hours as needed for mild pain (or temp > 37.5 C (99.5 F)).  Marland Kitchen albuterol (PROVENTIL) (2.5 MG/3ML) 0.083% nebulizer solution Take 3 mLs (2.5 mg total) by nebulization every 4 (four) hours as needed for wheezing or  shortness of breath.  Marland Kitchen atorvastatin (LIPITOR) 80 MG tablet Take 80 mg by mouth daily.   . bictegravir-emtricitabine-tenofovir AF (BIKTARVY) 50-200-25 MG TABS tablet Take 1 tablet by mouth daily.  . calcium carbonate (TUMS EX) 750 MG chewable tablet Chew 1 tablet by mouth 2 (two) times daily.  . Cholecalciferol (VITAMIN D3) 2000 UNITS TABS Take 2,000 mg by mouth daily.  . coal tar (NEUTROGENA T-GEL) 0.5 % shampoo Apply to scalp on shower days (Twice a week) for dandruff and dry scalp Wed., and Sat.  . docusate sodium (COLACE) 100 MG capsule Take 100 mg by mouth daily.  . metoprolol succinate (TOPROL-XL) 25 MG 24 hr tablet Take 1 tablet (25 mg total) by mouth daily.  . mometasone-formoterol (DULERA) 100-5 MCG/ACT AERO Inhale 2 puffs into the lungs 2 (two) times daily.  . multivitamin (RENA-VIT) TABS tablet Take 1 tablet by mouth at bedtime.  . NON FORMULARY Diet Type:  Dysphagia 2 with thin liquids  Fluid restrictions:  1200 cc / 24 hrs Breakfast = (12 oz) 360 ml Lunch = (8 oz) 240 ml Dinner (12 oz) 360 ml  . Nutritional Supplements (FEEDING SUPPLEMENT, NEPRO CARB STEADY,) LIQD Take by mouth daily.  Marland Kitchen omeprazole (PRILOSEC) 20 MG capsule Take 20 mg by mouth at bedtime.  . senna-docusate (SENEXON-S) 8.6-50 MG tablet Take 1 tablet by mouth daily.  . tamsulosin (FLOMAX) 0.4 MG CAPS capsule Take 0.4 mg by mouth daily.  Marland Kitchen UNABLE TO FIND 1.2L fluid restriction - 360 cc 912  oz) at breakfast, 240 cc (8 oz) at Lunch, 240 cc (8 oz) Dinner, 360 cc (12 oz) med pass / snacks om between meals  . WARFARIN SODIUM PO Take 4.5 mg by mouth daily. @ 5:00 pm  . [DISCONTINUED] WARFARIN SODIUM PO Take by mouth. Give 4.5 mg by mouth daily on Wednesday and Saturday   No facility-administered encounter medications on file as of 12/04/2018.      SIGNIFICANT DIAGNOSTIC EXAMS  TODAY;   11-19-18: BAS: thin liquids   PREVIOUS:   07-06-18: chest x-ray: Enlargement of cardiac silhouette with vascular congestion and question minimal pulmonary edema.  07-06-18: ct of head;  1. New age indeterminate RIGHT basal ganglia small infarcts. 2. Old RIGHT temporal occipital/PCA territory infarct. 3. Old basal ganglia, thalami, pontine and cerebellar small infarcts. Moderate to severe chronic small vessel ischemic changes. 4. Moderate parenchymal brain volume loss, advanced for age.  07-07-18: US renal: Medical renal disease changes of both kidneys. Small renal cysts in both kidneys. No evidence of hydronephrosis.  07-11-18: ct of head:  1. Stable head CT, with no new acute intracranial abnormality. 2. In band cerebral atrophy with chronic small vessel ischemic disease with multiple chronic ischemic infarcts as above. 3. Acute pan sinusitis, worsened from previous.  05-12-19 ct of abdomen and pelvis: Diverticulosis without diverticulitis. Bilateral renal cysts. Mild bibasilar atelectasis.  07-12-18: 2-d echo:   1. The left ventricle has severely reduced systolic function, with an ejection fraction of 20-25%. The cavity size was mildly dilated. There is mildly increased left ventricular wall thickness. Left ventricular diastolic Doppler parameters are  consistent with impaired relaxation Left ventricular diffuse hypokinesis.  2. The right ventricle has normal systolic function. The cavity was normal. There is no increase in right ventricular wall thickness.  3. Left atrial size was mildly  dilated.  4. The mitral valve is normal in structure. There is mild thickening.  5. The tricuspid valve is normal in structure.  6. AVR.  7.  The pulmonic valve was normal in structure.  8. Severe global reduction in LV systolic function; mild LVH; mild LVE; mild LAE; s/p AVR (not well visualized; mean gradient 12 mmHg; AVA 1.5 cm2 but may be underestimated); no vegetations but cannot R/O with this study; suggest TEE if clinically  indicated.  07-20-18: chest x-ray: Right IJ dialysis catheter in good position without complicating features. Cardiac enlargement and vascular congestion without overt pulmonary edema.  NO NEW EXAMS.    LABS REVIEWED PREVIOUS:   05-22-18: hgb a1c 6.5 chol 100 ldl 43; trig 170; hdl 23 07-04-18: wbc  3.7; hgb 14.0; hct 45.6; mcv 91.8; plt 124; glucose 100; bun 45; creat 2.44; k+ 4.7; na++ 139 BNP 679.0  07-06-18: wbc 1.9; hgb 15.2; hct 48.8; mcv 91.2 plt 107; glucose 136; bun 43; creat 3.01; k+ 5.9; na++ 136; ca 8.8 ast 70 alt 46; albumin 3.0 BNP 554.5   blood culture: no growth /e-coli  07-08-18: wbc 13.8; hgb 13.1; hct 41.1; mcv 93.2 plt 73 glucose 108 ;bun 74; creat 5.32; k+ 5.5; na++ 139; ca 7.5; ast 80 alt 117; albumin 2.3  07-09-18; tsh 3.098 07-17-18: wbc 3.1; hgb 1.8; hct 37.3; mcv 89.9 plt 103 glucose 114; bun 95; creat 6.00; k+ 3.5; na++ 139; ca 6.4 mag 2.1 07-27-18: INR 2.3: coumadin 5 mg daily   07-30-18: wbc  6.8; hgb 10.7; hct 33.6; mcv 89.6; plt 94 glucose 90; bun 93; creat 7.47; k+ 4.2; na++ 135; ca 7.1 08-02-18: INR 1.9   08-15-18: HIV quant: <20 CD4: 360  08-16-18: INR 3.2  08-23-18: INR  1.7 09-04-18: hgb 7.4 hct 23.6  glucose 90; bun 16; create 2.64; k+ 2.8; na++ 138; ca 8.3 phos 3.0; albumin 2.9 iron 52; tibc 218; PTH 124 vit 69.3  09-06-18: INR 2.5 guaiac neg  09-07-18: glucose 99; bun 22; creat 3.24; k+ 3.4;na++ 137; ca 8.4  09-11-18 hgb 10.3; hct 33.9 glucose 83; bun 18; creat 2.48; k+ 3.8; na++ 140; ca 8.9 iron 43; tibc 225 PTH 115 vit D 44.6 09-20-18: INR  2.1 09-22-18: guaiac: neg  10-13-18: INR 2.4 10-19-18: INR 2.1 11-02-18: INR 2.2   TODAY;   11-16-18: INR 3.8 11-19-18: INR 2.2 11-26-18: INR 1.6    Review of Systems  Unable to perform ROS: Dementia (unable to participate )    Physical Exam Constitutional:      General: He is not in acute distress.    Appearance: He is well-developed. He is not diaphoretic.  Neck:     Musculoskeletal: Neck supple.     Thyroid: No thyromegaly.  Cardiovascular:     Rate and Rhythm: Normal rate and regular rhythm.     Pulses: Normal pulses.     Heart sounds: Murmur present.     Comments: 2/6 ICD in place History of AVR Pulmonary:     Effort: Pulmonary effort is normal. No respiratory distress.     Breath sounds: Normal breath sounds.  Abdominal:     General: Bowel sounds are normal. There is no distension.     Palpations: Abdomen is soft.     Tenderness: There is no abdominal tenderness.  Musculoskeletal:     Right lower leg: No edema.     Left lower leg: No edema.     Comments: Is able to move all extremities   Lymphadenopathy:     Cervical: No cervical adenopathy.  Skin:    General: Skin is warm and dry.     Comments:  Left arm arm  a/v fistula: + thrill + bruit     Neurological:     Mental Status: He is alert. Mental status is at baseline.  Psychiatric:        Mood and Affect: Mood normal.         ASSESSMENT/ PLAN:  TODAY:   1. Hypertension associated with stage 5 chronic kidney disease due to type 2 diabetes mellitus: is stable will continue toprol xl 25 mg daily   2.  PAF (paroxysmal atrial fibrillation) status post bioprosthetic AVR heart rate stable: will continue toprol xl 25 mg daily for rate control and is on chronic coumadin therapy.   3. Dyslipidemia associated with type 2 diabetes mellitus: is stable LDL 43; will continue lipitor 80 mg daily   PREVIOUS   4. Type 2 diabetes mellitus with hypertension and end stage renal disease on dialysis: is stable hgb a1c 6.5  will continue to monitor his status. .   5.  Chronic kidney disease with end stage renal disease on dialysis due to type 2 diabetes mellitus/dialysis dependent: is without change is being dialyzed three times weekly is follow by nephrology. Is on 1200 cc fluid restriction   6. Other emphysema: is stable will continue dulera 100/5 mcg 2 puffs twice daily and has albuterol neb every 4 hours as needed  7. HIV disease: is without change will continue Biktarvy 20-200-25 mg daily is followed by I/D  8. CVA/Mulitple cva: is neurologically stable will continue to monitor is on chronic coumadin therapy   9. Anemia due to end stage renal disease: is stable hgb 10.3 will continue to monitor   10. 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: is without signs of aspiration present; will continue honey thick liquids  11. Protein calorie malnutrition severe: is without change: albumin 2.9 (previous 2.3)  weight is (previous 149) pounds will continue supplements as directed  12. 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 BPH with obstruction/urinary tract symptoms is stable will continue flomax 0.4 mg daily   14. GERD without esophagitis: is stable will continue prilosec 20 mg daily   15. Chronic constipation: is stable will continue colace and senna s daily   Will check hgb a1c    MD is aware of resident's narcotic use and is in agreement with current plan of care. We will attempt to wean resident as apropriate   Ok Edwards NP [facility]  Contact [phone] Monday through Friday 8am- 5pm  After hours call [phone]

## 2018-12-05 ENCOUNTER — Encounter (HOSPITAL_COMMUNITY)
Admission: RE | Admit: 2018-12-05 | Discharge: 2018-12-05 | Disposition: A | Discharge status: Home / Self Care | Payer: Medicare HMO | Source: Skilled Nursing Facility | Attending: Adult Health | Admitting: Adult Health

## 2018-12-05 DIAGNOSIS — E1129 Type 2 diabetes mellitus with other diabetic kidney complication: Secondary | ICD-10-CM | POA: Insufficient documentation

## 2018-12-06 LAB — HEMOGLOBIN A1C
Hgb A1c MFr Bld: 6.1 % — ABNORMAL HIGH (ref 4.8–5.6)
Mean Plasma Glucose: 128 mg/dL

## 2018-12-10 ENCOUNTER — Non-Acute Institutional Stay (SKILLED_NURSING_FACILITY): Payer: Medicare HMO | Admitting: Adult Health

## 2018-12-10 ENCOUNTER — Encounter: Payer: Self-pay | Admitting: Adult Health

## 2018-12-10 ENCOUNTER — Encounter (HOSPITAL_COMMUNITY)
Admission: RE | Admit: 2018-12-10 | Discharge: 2018-12-10 | Disposition: A | Discharge status: Home / Self Care | Payer: Medicare HMO | Source: Ambulatory Visit | Attending: *Deleted | Admitting: *Deleted

## 2018-12-10 DIAGNOSIS — E1129 Type 2 diabetes mellitus with other diabetic kidney complication: Secondary | ICD-10-CM | POA: Diagnosis not present

## 2018-12-10 DIAGNOSIS — I480 Paroxysmal atrial fibrillation: Secondary | ICD-10-CM | POA: Diagnosis not present

## 2018-12-10 DIAGNOSIS — Z952 Presence of prosthetic heart valve: Secondary | ICD-10-CM | POA: Diagnosis not present

## 2018-12-10 DIAGNOSIS — I639 Cerebral infarction, unspecified: Secondary | ICD-10-CM | POA: Diagnosis not present

## 2018-12-10 DIAGNOSIS — Z7901 Long term (current) use of anticoagulants: Secondary | ICD-10-CM | POA: Diagnosis not present

## 2018-12-10 LAB — PROTIME-INR
INR: 1.9 — ABNORMAL HIGH (ref 0.8–1.2)
Prothrombin Time: 21.8 seconds — ABNORMAL HIGH (ref 11.4–15.2)

## 2018-12-10 NOTE — Progress Notes (Signed)
Location:   [facility] Room Number: [place] of Service:  SNF (31)   CODE STATUS: DNR  No Known Allergies  Chief Complaint  Patient presents with   Acute Visit    PT / INR    HPI:  He is on long term coumadin therapy for afib; cva; avr. His goal is 2.5-3.5. his INR today is 1.9 and is taking coumadin 4.5 mg daily. There are no reports of missed doses; no uncontrolled bleeding. No reports of uncontrolled pain; no changes in appetite; no reports of agitation.   Past Medical History:  Diagnosis Date   AICD (automatic cardioverter/defibrillator) present    Alcoholism in remission ([place])    Anemia    Aortic insufficiency    Arthritis    At moderate risk for fall    Bell's palsy    Chronic systolic heart failure ([place])    a. EF previously 15-20% with cath in 2014 showing no significant CAD b. EF 20-25% by echo in 06/2018   CKD (chronic kidney disease), stage IV (HCC)    COPD (chronic obstructive pulmonary disease) ([place])    Essential hypertension, benign    HIV disease ([place]) 09/06/2016   Hyperlipidemia    NICM (nonischemic cardiomyopathy) ([place])    Noncompliance with medication regimen    NSVT (nonsustained ventricular tachycardia) (HCC)    Numbness of right jaw    Had a stoke in 01/2013. Numbness is occasional, especially when trying to chew.   OSA (obstructive sleep apnea)    Productive cough 08/2013   With brown sputum    S/P aortic valve replacement with bioprosthetic valve    Stroke ([place]) 01/2013   weakness of right side from CVA   Type 2 diabetes mellitus ([place])    Uses hearing aid 2014   recently received new hearing aids    Past Surgical History:  Procedure Laterality Date   AV FISTULA PLACEMENT Left 07/20/2018   Procedure: INSERTION ARTERIOVENOUS GORTEX GRAFT  LEFT ARM;  Surgeon: Rosetta Posner, MD;  Location: [place];  Service: Vascular;  Laterality: Left;   BIOPSY N/A 04/10/2014   Procedure: GASTRIC BIOPSY;   Surgeon: Daneil Dolin, MD;  Location: [facility];  Service: Endoscopy;  Laterality: N/A;   BIOPSY  10/12/2015   Procedure: BIOPSY;  Surgeon: Daneil Dolin, MD;  Location[facility];  Service: Endoscopy;;  stomach bx's   CARDIAC DEFIBRILLATOR PLACEMENT  11/12/12   [employer] Inogen MINI ICD implanted in [place] at [facility] per Dr Nelly Laurence note   COLONOSCOPY WITH PROPOFOL N/A 04/10/2014   RMR: Colonic Diverticulosis   ESOPHAGOGASTRODUODENOSCOPY (EGD) WITH PROPOFOL N/A 04/10/2014   RMR: Mild erosive reflux esophagitis. Multiple antral polyps likely hyperplastic status post removal by hot snare cautery technique. Diffusely abnormal stomach status post gastric biopsy I suspect some of patients anemaia may be due to intermittent oozing from the stomach. It would be difficult and a risky proposition to attempt complete removal of all of his gastric polyps.   ESOPHAGOGASTRODUODENOSCOPY (EGD) WITH PROPOFOL N/A 10/12/2015   Procedure: ESOPHAGOGASTRODUODENOSCOPY (EGD) WITH PROPOFOL;  Surgeon: Daneil Dolin, MD;  [facility];  Service: Endoscopy;  Laterality: N/A;  0845 - moved to [place] Right 07/20/2018   Procedure: EXCHANGE OF DIALYSIS CATHETER RIGHT INTERNAL JUGULAR;  Surgeon: Rosetta Posner, MD;  Location: [place];  Service: Vascular;  Laterality: Right;  IR FLUORO GUIDE CV LINE RIGHT  07/09/2018   IR US GUIDE VASC ACCESS RIGHT  07/09/2018   POLYPECTOMY N/A 04/10/2014   Procedure: GASTRIC POLYPECTOMY;  Surgeon: Daneil Dolin, MD;  Location: [facility];  Service: Endoscopy;  Laterality: N/A;   RIGHT HEART CATHETERIZATION N/A 02/06/2014   Procedure: RIGHT HEART CATH;  Surgeon: Larey Dresser, MD;  Location: [facility] CATH LAB;  Service: Cardiovascular;  Laterality: N/A;    Social History   Socioeconomic History   Marital status: Single    Spouse name: Not on file   Number of children: Not on file     Years of education: Not on file   Highest education level: Not on file  Occupational History   Occupation: disabled  Social Designer, fashion/clothing strain: Not hard at all   Food insecurity    Worry: Never true    Inability: Never true   Transportation needs    Medical: No    Non-medical: No  Tobacco Use   Smoking status: Former Smoker    Types: Cigarettes    Quit date: 11/05/1993    Years since quitting: 25.1   Smokeless tobacco: Never Used  Substance and Sexual Activity   Alcohol use: Not Currently    Comment: former heavy ETOH, none recently   Drug use: No    Comment: prior history of cocaine use, smoking    Sexual activity: Yes    Birth control/protection: None  Lifestyle   Physical activity    Days per week: 4 days    Minutes per session: 20 min   Stress: Not at all  Relationships   Social connections    Talks on phone: Never    Gets together: Once a week    Attends religious service: Never    Active member of club or organization: No    Attends meetings of clubs or organizations: Never    Relationship status: Never married   Intimate partner violence    Fear of current or ex partner: No    Emotionally abused: No    Physically abused: No    Forced sexual activity: No  Other Topics Concern   Not on file  Social History Narrative   Recently moved from [place] to be with family in [place] (2015).  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  Has h/o polysubstance abuse.  Baseline - able to ambulate, mild cognitive delay, able to toilet and shower himself, occasional use of walker/cane.   2019- resident of [facility]. Unable to ambulate unassisted.            Family History  Problem Relation Age of Onset   Kidney disease Mother    Kidney disease Sister    Colon cancer Neg Hx       VITAL SIGNS BP 116/78    Pulse 66    Temp (!) 97.4 F (36.3 C)    Resp 20    Ht 5\' 3"  (1.6 m)    Wt 142 lb 3.2 oz (64.5 kg)     SpO2 94%    BMI 25.19 kg/m   Outpatient Encounter Medications as of 12/10/2018  Medication Sig   acetaminophen (TYLENOL) 325 MG tablet Take 2 tablets (650 mg total) by mouth every 4 (four) hours as needed for mild pain (or temp > 37.5 C (99.5 F)).   albuterol (PROVENTIL) (2.5 MG/3ML) 0.083% nebulizer solution Take 3 mLs (2.5 mg total) by nebulization every 4 (  four) hours as needed for wheezing or shortness of breath.   atorvastatin (LIPITOR) 80 MG tablet Take 80 mg by mouth daily.    bictegravir-emtricitabine-tenofovir AF (BIKTARVY) 50-200-25 MG TABS tablet Take 1 tablet by mouth daily.   calcium carbonate (TUMS EX) 750 MG chewable tablet Chew 1 tablet by mouth 2 (two) times daily.   Cholecalciferol (VITAMIN D3) 2000 UNITS TABS Take 2,000 mg by mouth daily.   coal tar (NEUTROGENA T-GEL) 0.5 % shampoo Apply to scalp on shower days (Twice a week) for dandruff and dry scalp Wed., and Sat.   docusate sodium (COLACE) 100 MG capsule Take 100 mg by mouth daily.   Fluticasone-Salmeterol (ADVAIR) 100-50 MCG/DOSE AEPB Inhale 1 puff into the lungs daily.   metoprolol succinate (TOPROL-XL) 25 MG 24 hr tablet Take 1 tablet (25 mg total) by mouth daily.   multivitamin (RENA-VIT) TABS tablet Take 1 tablet by mouth at bedtime.   NON FORMULARY Diet Type:  Dysphagia 2 with thin liquids  Fluid restrictions:  1200 cc / 24 hrs Breakfast = (12 oz) 360 ml Lunch = (8 oz) 240 ml Dinner (12 oz) 360 ml   Nutritional Supplements (FEEDING SUPPLEMENT, NEPRO CARB STEADY,) LIQD Take by mouth daily.   omeprazole (PRILOSEC) 20 MG capsule Take 20 mg by mouth at bedtime.   senna-docusate (SENEXON-S) 8.6-50 MG tablet Take 1 tablet by mouth daily.   tamsulosin (FLOMAX) 0.4 MG CAPS capsule Take 0.4 mg by mouth daily.   UNABLE TO FIND 1.2L fluid restriction - 360 cc 912 oz) at breakfast, 240 cc (8 oz) at Lunch, 240 cc (8 oz) Dinner, 360 cc (12 oz) med pass / snacks om between meals   WARFARIN SODIUM PO Take  4.5 mg by mouth daily. @ 5:00 pm   [DISCONTINUED] mometasone-formoterol (DULERA) 100-5 MCG/ACT AERO Inhale 2 puffs into the lungs 2 (two) times daily. (Patient not taking: Reported on 12/10/2018)   No facility-administered encounter medications on file as of 12/10/2018.      SIGNIFICANT DIAGNOSTIC EXAMS  TODAY;   11-19-18: BAS: thin liquids   PREVIOUS:   07-06-18: chest x-ray: Enlargement of cardiac silhouette with vascular congestion and question minimal pulmonary edema.  07-06-18: ct of head;  1. New age indeterminate RIGHT basal ganglia small infarcts. 2. Old RIGHT temporal occipital/PCA territory infarct. 3. Old basal ganglia, thalami, pontine and cerebellar small infarcts. Moderate to severe chronic small vessel ischemic changes. 4. Moderate parenchymal brain volume loss, advanced for age.  07-07-18: US renal: Medical renal disease changes of both kidneys. Small renal cysts in both kidneys. No evidence of hydronephrosis.  07-11-18: ct of head:  1. Stable head CT, with no new acute intracranial abnormality. 2. In band cerebral atrophy with chronic small vessel ischemic disease with multiple chronic ischemic infarcts as above. 3. Acute pan sinusitis, worsened from previous.  05-12-19 ct of abdomen and pelvis: Diverticulosis without diverticulitis. Bilateral renal cysts. Mild bibasilar atelectasis.  07-12-18: 2-d echo:   1. The left ventricle has severely reduced systolic function, with an ejection fraction of 20-25%. The cavity size was mildly dilated. There is mildly increased left ventricular wall thickness. Left ventricular diastolic Doppler parameters are  consistent with impaired relaxation Left ventricular diffuse hypokinesis.  2. The right ventricle has normal systolic function. The cavity was normal. There is no increase in right ventricular wall thickness.  3. Left atrial size was mildly dilated.  4. The mitral valve is normal in structure. There is mild thickening.  5.  The tricuspid valve  is normal in structure.  6. AVR.  7. The pulmonic valve was normal in structure.  8. Severe global reduction in LV systolic function; mild LVH; mild LVE; mild LAE; s/p AVR (not well visualized; mean gradient 12 mmHg; AVA 1.5 cm2 but may be underestimated); no vegetations but cannot R/O with this study; suggest TEE if clinically  indicated.  07-20-18: chest x-ray: Right IJ dialysis catheter in good position without complicating features. Cardiac enlargement and vascular congestion without overt pulmonary edema.  NO NEW EXAMS.    LABS REVIEWED PREVIOUS:   05-22-18: hgb a1c 6.5 chol 100 ldl 43; trig 170; hdl 23 07-04-18: wbc  3.7; hgb 14.0; hct 45.6; mcv 91.8; plt 124; glucose 100; bun 45; creat 2.44; k+ 4.7; na++ 139 BNP 679.0  07-06-18: wbc 1.9; hgb 15.2; hct 48.8; mcv 91.2 plt 107; glucose 136; bun 43; creat 3.01; k+ 5.9; na++ 136; ca 8.8 ast 70 alt 46; albumin 3.0 BNP 554.5   blood culture: no growth /e-coli  07-08-18: wbc 13.8; hgb 13.1; hct 41.1; mcv 93.2 plt 73 glucose 108 ;bun 74; creat 5.32; k+ 5.5; na++ 139; ca 7.5; ast 80 alt 117; albumin 2.3  07-09-18; tsh 3.098 07-17-18: wbc 3.1; hgb 1.8; hct 37.3; mcv 89.9 plt 103 glucose 114; bun 95; creat 6.00; k+ 3.5; na++ 139; ca 6.4 mag 2.1 07-27-18: INR 2.3: coumadin 5 mg daily   07-30-18: wbc  6.8; hgb 10.7; hct 33.6; mcv 89.6; plt 94 glucose 90; bun 93; creat 7.47; k+ 4.2; na++ 135; ca 7.1 08-02-18: INR 1.9   08-15-18: HIV quant: <20 CD4: 360  08-16-18: INR 3.2  08-23-18: INR  1.7 09-04-18: hgb 7.4 hct 23.6  glucose 90; bun 16; create 2.64; k+ 2.8; na++ 138; ca 8.3 phos 3.0; albumin 2.9 iron 52; tibc 218; PTH 124 vit 69.3  09-06-18: INR 2.5 guaiac neg  09-07-18: glucose 99; bun 22; creat 3.24; k+ 3.4;na++ 137; ca 8.4  09-11-18 hgb 10.3; hct 33.9 glucose 83; bun 18; creat 2.48; k+ 3.8; na++ 140; ca 8.9 iron 43; tibc 225 PTH 115 vit D 44.6 09-20-18: INR 2.1 09-22-18: guaiac: neg  10-13-18: INR 2.4 10-19-18: INR 2.1 11-02-18: INR 2.2    11-16-18: INR 3.8 11-19-18: INR 2.2 11-26-18: INR 1.6   TODAY  12-05-18: hgb a1c 6.1 12-10-18: INR 1.9    Review of Systems  Unable to perform ROS: Dementia (unable to participate )     Physical Exam Constitutional:      General: He is not in acute distress.    Appearance: He is well-developed. He is not diaphoretic.  Neck:     Musculoskeletal: Neck supple.     Thyroid: No thyromegaly.  Cardiovascular:     Rate and Rhythm: Normal rate. Rhythm irregular.     Pulses: Normal pulses.     Heart sounds: Murmur present.     Comments: 2/6 ICD in place History of AVR Pulmonary:     Effort: Pulmonary effort is normal. No respiratory distress.     Breath sounds: Normal breath sounds.  Abdominal:     General: Bowel sounds are normal. There is no distension.     Palpations: Abdomen is soft.     Tenderness: There is no abdominal tenderness.     Comments: 2015: gastric bypass   Musculoskeletal:     Right lower leg: No edema.     Left lower leg: No edema.     Comments: Is able to move all extremities  Lymphadenopathy:  Cervical: No cervical adenopathy.  Skin:    General: Skin is warm and dry.     Comments: Left arm arm a/v fistula: + thrill + bruit      Neurological:     Mental Status: He is alert. Mental status is at baseline.  Psychiatric:        Mood and Affect: Mood normal.         ASSESSMENT/ PLAN:  TODAY:   1. PAF (paroxysmal atrial fibrillation)   2. Cerebrovascular accident (CVA) 3. S/p avr (aortic valve replacement) 4. Chronic anticoagulation   For INR 1.9 will begin coumadin 5 mg daily and will check INR 12-18-18.     MD is aware of resident's narcotic use and is in agreement with current plan of care. We will attempt to wean resident as apropriate   Keith Edwards NP [facility]  Contact [phone] Monday through Friday 8am- 5pm  After hours call [phone]

## 2018-12-11 DIAGNOSIS — Z7901 Long term (current) use of anticoagulants: Secondary | ICD-10-CM | POA: Insufficient documentation

## 2018-12-18 ENCOUNTER — Non-Acute Institutional Stay (SKILLED_NURSING_FACILITY): Payer: Medicare HMO | Admitting: Adult Health

## 2018-12-18 ENCOUNTER — Other Ambulatory Visit (HOSPITAL_COMMUNITY)
Admission: RE | Admit: 2018-12-18 | Discharge: 2018-12-18 | Disposition: A | Discharge status: Home / Self Care | Payer: Medicare HMO | Source: Skilled Nursing Facility | Attending: Adult Health | Admitting: Adult Health

## 2018-12-18 ENCOUNTER — Encounter: Payer: Self-pay | Admitting: Adult Health

## 2018-12-18 DIAGNOSIS — Z952 Presence of prosthetic heart valve: Secondary | ICD-10-CM | POA: Diagnosis not present

## 2018-12-18 DIAGNOSIS — Z7901 Long term (current) use of anticoagulants: Secondary | ICD-10-CM | POA: Diagnosis not present

## 2018-12-18 DIAGNOSIS — I639 Cerebral infarction, unspecified: Secondary | ICD-10-CM

## 2018-12-18 DIAGNOSIS — I480 Paroxysmal atrial fibrillation: Secondary | ICD-10-CM

## 2018-12-18 LAB — PROTIME-INR
INR: 2.2 — ABNORMAL HIGH (ref 0.8–1.2)
Prothrombin Time: 24.5 seconds — ABNORMAL HIGH (ref 11.4–15.2)

## 2018-12-18 NOTE — Progress Notes (Signed)
Location:   [place] Room Number: [place] of Service:  SNF (31)   CODE STATUS: DNR  No Known Allergies  Chief Complaint  Patient presents with  . Acute Visit    INR    HPI:  He is on long term coumadin therapy for PAF: cva; and bioprosthetic aortic valve replacement INR goal 2.5-3.  INR today is 2.2 There are no reports of missed doses; no reports of abnormal bleeding.  There are no reports of changes in his appetite; no leg swelling; no palpitations. There are no reports of dietary changes.   Past Medical History:  Diagnosis Date  . AICD (automatic cardioverter/defibrillator) present   . Alcoholism in remission ([place])   . Anemia   . Aortic insufficiency   . Arthritis   . At moderate risk for fall   . Bell's palsy   . Chronic systolic heart failure (HCC)    a. EF previously 15-20% with cath in 2014 showing no significant CAD b. EF 20-25% by echo in 06/2018  . CKD (chronic kidney disease), stage IV ([place])   . COPD (chronic obstructive pulmonary disease) ([place])   . Essential hypertension, benign   . HIV disease ([place]) 09/06/2016  . Hyperlipidemia   . NICM (nonischemic cardiomyopathy) ([place])   . Noncompliance with medication regimen   . NSVT (nonsustained ventricular tachycardia) ([place])   . Numbness of right jaw    Had a stoke in 01/2013. Numbness is occasional, especially when trying to chew.  . OSA (obstructive sleep apnea)   . Productive cough 08/2013   With brown sputum   . S/P aortic valve replacement with bioprosthetic valve   . Stroke ([place]) 01/2013   weakness of right side from CVA  . Type 2 diabetes mellitus ([place])   . Uses hearing aid 2014   recently received new hearing aids    Past Surgical History:  Procedure Laterality Date  . AV FISTULA PLACEMENT Left 07/20/2018   Procedure: INSERTION ARTERIOVENOUS GORTEX GRAFT  LEFT ARM;  Surgeon: Rosetta Posner, MD;  Location: [place] [facility];  Service: Vascular;  Laterality: Left;  . BIOPSY N/A  04/10/2014   Procedure: GASTRIC BIOPSY;  Surgeon: Daneil Dolin, MD;  Location: [facility];  Service: Endoscopy;  Laterality: N/A;  . BIOPSY  10/12/2015   Procedure: BIOPSY;  Surgeon: Daneil Dolin, MD;  [facility];  Service: Endoscopy;;  stomach bx's  . CARDIAC DEFIBRILLATOR PLACEMENT  11/12/12   [employer] Inogen MINI ICD implanted in [place] at [place] AVR per Dr Nelly Laurence note  . COLONOSCOPY WITH PROPOFOL N/A 04/10/2014   RMR: Colonic Diverticulosis  . ESOPHAGOGASTRODUODENOSCOPY (EGD) WITH PROPOFOL N/A 04/10/2014   RMR: Mild erosive reflux esophagitis. Multiple antral polyps likely hyperplastic status post removal by hot snare cautery technique. Diffusely abnormal stomach status post gastric biopsy I suspect some of patients anemaia may be due to intermittent oozing from the stomach. It would be difficult and a risky proposition to attempt complete removal of all of his gastric polyps.  . ESOPHAGOGASTRODUODENOSCOPY (EGD) WITH PROPOFOL N/A 10/12/2015   Procedure: ESOPHAGOGASTRODUODENOSCOPY (EGD) WITH PROPOFOL;  Surgeon: Daneil Dolin, MD;  [facility];  Service: Endoscopy;  Laterality: N/A;  8938 - moved to 9:15  . INSERTION OF DIALYSIS CATHETER Right 07/20/2018   Procedure: EXCHANGE OF DIALYSIS CATHETER RIGHT INTERNAL JUGULAR;  Surgeon: Rosetta Posner, MD;  Location: [facility];  Service: Vascular;  Laterality: Right;  . IR FLUORO GUIDE CV LINE RIGHT  07/09/2018  . IR US GUIDE VASC ACCESS RIGHT  07/09/2018  . POLYPECTOMY N/A 04/10/2014   Procedure: GASTRIC POLYPECTOMY;  Surgeon: Daneil Dolin, MD;  Location: [facility];  Service: Endoscopy;  Laterality: N/A;  . RIGHT HEART CATHETERIZATION N/A 02/06/2014   Procedure: RIGHT HEART CATH;  Surgeon: Larey Dresser, MD;  Location: [facility] CATH LAB;  Service: Cardiovascular;  Laterality: N/A;    Social History   Socioeconomic History  . Marital status: Single    Spouse name: Not on  file  . Number of children: Not on file  . Years of education: Not on file  . Highest education level: Not on file  Occupational History  . Occupation: disabled  Social Needs  . Financial resource strain: Not hard at all  . Food insecurity    Worry: Never true    Inability: Never true  . Transportation needs    Medical: No    Non-medical: No  Tobacco Use  . Smoking status: Former Smoker    Types: Cigarettes    Quit date: 11/05/1993    Years since quitting: 25.1  . Smokeless tobacco: Never Used  Substance and Sexual Activity  . Alcohol use: Not Currently    Comment: former heavy ETOH, none recently  . Drug use: No    Comment: prior history of cocaine use, smoking   . Sexual activity: Yes    Birth control/protection: None  Lifestyle  . Physical activity    Days per week: 4 days    Minutes per session: 20 min  . Stress: Not at all  Relationships  . Social Herbalist on phone: Never    Gets together: Once a week    Attends religious service: Never    Active member of club or organization: No    Attends meetings of clubs or organizations: Never    Relationship status: Never married  . Intimate partner violence    Fear of current or ex partner: No    Emotionally abused: No    Physically abused: No    Forced sexual activity: No  Other Topics Concern  . Not on file  Social History Narrative   Recently moved from [place] to be with family in [place] (2015).  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  Has h/o polysubstance abuse.  Baseline - able to ambulate, mild cognitive delay, able to toilet and shower himself, occasional use of walker/cane.   2019- resident of [facility] Unable to ambulate unassisted.            Family History  Problem Relation Age of Onset  . Kidney disease Mother   . Kidney disease Sister   . Colon cancer Neg Hx       VITAL SIGNS BP 116/78   Pulse 66   Temp 98.9 F (37.2 C)   Resp 20   Ht 5\' 3"   (1.6 m)   Wt 142 lb 3.2 oz (64.5 kg)   SpO2 94%   BMI 25.19 kg/m   Outpatient Encounter Medications as of 12/18/2018  Medication Sig  . acetaminophen (TYLENOL) 325 MG tablet Take 2 tablets (650 mg total) by mouth every 4 (four) hours as needed for mild pain (or temp > 37.5 C (99.5 F)).  Marland Kitchen albuterol (PROVENTIL) (2.5 MG/3ML) 0.083% nebulizer solution Take 3 mLs (2.5 mg total) by nebulization every 4 (four) hours  as needed for wheezing or shortness of breath.  Marland Kitchen atorvastatin (LIPITOR) 80 MG tablet Take 80 mg by mouth daily.   . bictegravir-emtricitabine-tenofovir AF (BIKTARVY) 50-200-25 MG TABS tablet Take 1 tablet by mouth daily.  . calcium carbonate (TUMS EX) 750 MG chewable tablet Chew 1 tablet by mouth 2 (two) times daily.  . Cholecalciferol (VITAMIN D3) 2000 UNITS TABS Take 2,000 mg by mouth daily.  . coal tar (NEUTROGENA T-GEL) 0.5 % shampoo Apply to scalp on shower days (Twice a week) for dandruff and dry scalp Wed., and Sat.  . docusate sodium (COLACE) 100 MG capsule Take 100 mg by mouth daily.  . Fluticasone-Salmeterol (ADVAIR) 100-50 MCG/DOSE AEPB Inhale 1 puff into the lungs daily.  . metoprolol succinate (TOPROL-XL) 25 MG 24 hr tablet Take 1 tablet (25 mg total) by mouth daily.  . multivitamin (RENA-VIT) TABS tablet Take 1 tablet by mouth at bedtime.  . NON FORMULARY Diet Type:  Dysphagia 2 with thin liquids  Fluid restrictions:  1200 cc / 24 hrs Breakfast = (12 oz) 360 ml Lunch = (8 oz) 240 ml Dinner (12 oz) 360 ml  . Nutritional Supplements (FEEDING SUPPLEMENT, NEPRO CARB STEADY,) LIQD Take by mouth daily.  Marland Kitchen omeprazole (PRILOSEC) 20 MG capsule Take 20 mg by mouth at bedtime.  . senna-docusate (SENEXON-S) 8.6-50 MG tablet Take 1 tablet by mouth daily.  . tamsulosin (FLOMAX) 0.4 MG CAPS capsule Take 0.4 mg by mouth daily.  Marland Kitchen UNABLE TO FIND 1.2L fluid restriction - 360 cc 912 oz) at breakfast, 240 cc (8 oz) at Lunch, 240 cc (8 oz) Dinner, 360 cc (12 oz) med pass / snacks om  between meals  . WARFARIN SODIUM PO Take 4.5 mg by mouth daily. @ 5:00 pm   No facility-administered encounter medications on file as of 12/18/2018.      SIGNIFICANT DIAGNOSTIC EXAMS  PREVIOUS    07-06-18: chest x-ray: Enlargement of cardiac silhouette with vascular congestion and question minimal pulmonary edema.  07-06-18: ct of head;  1. New age indeterminate RIGHT basal ganglia small infarcts. 2. Old RIGHT temporal occipital/PCA territory infarct. 3. Old basal ganglia, thalami, pontine and cerebellar small infarcts. Moderate to severe chronic small vessel ischemic changes. 4. Moderate parenchymal brain volume loss, advanced for age.  07-07-18: US renal: Medical renal disease changes of both kidneys. Small renal cysts in both kidneys. No evidence of hydronephrosis.  07-11-18: ct of head:  1. Stable head CT, with no new acute intracranial abnormality. 2. In band cerebral atrophy with chronic small vessel ischemic disease with multiple chronic ischemic infarcts as above. 3. Acute pan sinusitis, worsened from previous.  05-12-19 ct of abdomen and pelvis: Diverticulosis without diverticulitis. Bilateral renal cysts. Mild bibasilar atelectasis.  07-12-18: 2-d echo:   1. The left ventricle has severely reduced systolic function, with an ejection fraction of 20-25%. The cavity size was mildly dilated. There is mildly increased left ventricular wall thickness. Left ventricular diastolic Doppler parameters are  consistent with impaired relaxation Left ventricular diffuse hypokinesis.  2. The right ventricle has normal systolic function. The cavity was normal. There is no increase in right ventricular wall thickness.  3. Left atrial size was mildly dilated.  4. The mitral valve is normal in structure. There is mild thickening.  5. The tricuspid valve is normal in structure.  6. AVR.  7. The pulmonic valve was normal in structure.  8. Severe global reduction in LV systolic function; mild  LVH; mild LVE; mild LAE; s/p AVR (not  well visualized; mean gradient 12 mmHg; AVA 1.5 cm2 but may be underestimated); no vegetations but cannot R/O with this study; suggest TEE if clinically  indicated.  07-20-18: chest x-ray: Right IJ dialysis catheter in good position without complicating features. Cardiac enlargement and vascular congestion without overt pulmonary edema.  11-19-18: BAS: thin liquids  NO NEW EXAMS.    LABS REVIEWED PREVIOUS:   05-22-18: hgb a1c 6.5 chol 100 ldl 43; trig 170; hdl 23 07-04-18: wbc  3.7; hgb 14.0; hct 45.6; mcv 91.8; plt 124; glucose 100; bun 45; creat 2.44; k+ 4.7; na++ 139 BNP 679.0  07-06-18: wbc 1.9; hgb 15.2; hct 48.8; mcv 91.2 plt 107; glucose 136; bun 43; creat 3.01; k+ 5.9; na++ 136; ca 8.8 ast 70 alt 46; albumin 3.0 BNP 554.5   blood culture: no growth /e-coli  07-08-18: wbc 13.8; hgb 13.1; hct 41.1; mcv 93.2 plt 73 glucose 108 ;bun 74; creat 5.32; k+ 5.5; na++ 139; ca 7.5; ast 80 alt 117; albumin 2.3  07-09-18; tsh 3.098 07-17-18: wbc 3.1; hgb 1.8; hct 37.3; mcv 89.9 plt 103 glucose 114; bun 95; creat 6.00; k+ 3.5; na++ 139; ca 6.4 mag 2.1 07-27-18: INR 2.3: coumadin 5 mg daily   07-30-18: wbc  6.8; hgb 10.7; hct 33.6; mcv 89.6; plt 94 glucose 90; bun 93; creat 7.47; k+ 4.2; na++ 135; ca 7.1 08-02-18: INR 1.9   08-15-18: HIV quant: <20 CD4: 360  08-16-18: INR 3.2  08-23-18: INR  1.7 09-04-18: hgb 7.4 hct 23.6  glucose 90; bun 16; create 2.64; k+ 2.8; na++ 138; ca 8.3 phos 3.0; albumin 2.9 iron 52; tibc 218; PTH 124 vit 69.3  09-06-18: INR 2.5 guaiac neg  09-07-18: glucose 99; bun 22; creat 3.24; k+ 3.4;na++ 137; ca 8.4  09-11-18 hgb 10.3; hct 33.9 glucose 83; bun 18; creat 2.48; k+ 3.8; na++ 140; ca 8.9 iron 43; tibc 225 PTH 115 vit D 44.6 09-20-18: INR 2.1 09-22-18: guaiac: neg  10-13-18: INR 2.4 10-19-18: INR 2.1 11-02-18: INR 2.2  11-16-18: INR 3.8 11-19-18: INR 2.2 11-26-18: INR 1.6  12-05-18: hgb a1c 6.1 12-10-18: INR 1.9  TODAY:   12-18-18: INR 2.2     Review  of Systems  Unable to perform ROS: Dementia (unable to participate )     Physical Exam Constitutional:      General: He is not in acute distress.    Appearance: He is well-developed. He is not diaphoretic.  Neck:     Musculoskeletal: Neck supple.     Thyroid: No thyromegaly.  Cardiovascular:     Rate and Rhythm: Normal rate. Rhythm irregular.     Pulses: Normal pulses.     Heart sounds: Murmur present.     Comments:  2/6 ICD in place History of AVR Pulmonary:     Effort: Pulmonary effort is normal. No respiratory distress.     Breath sounds: Normal breath sounds.  Abdominal:     General: Bowel sounds are normal. There is no distension.     Palpations: Abdomen is soft.     Tenderness: There is no abdominal tenderness.     Comments: 2015: gastric bypass    Musculoskeletal:     Right lower leg: No edema.     Left lower leg: No edema.     Comments: Is able to move all extremities   Lymphadenopathy:     Cervical: No cervical adenopathy.  Skin:    General: Skin is warm and dry.     Comments: Left  arm arm a/v fistula: + thrill + bruit       Neurological:     Mental Status: He is alert. Mental status is at baseline.  Psychiatric:        Mood and Affect: Mood normal.        ASSESSMENT/ PLAN:  TODAY:   1. PAF (paroxsymal atrial fibrillation) 2. Cerebrovascular accident (CVA) 3. S/p avr (aortic valve replacement) 4. Chronic anticoagulation   For INR 2.2 will give coumadin 7.5 mg one time then start 5 mg daily will check INR on 12-25-18    MD is aware of resident's narcotic use and is in agreement with current plan of care. We will attempt to wean resident as apropriate   Ok Edwards NP [facility]  Contact [phone] Monday through Friday 8am- 5pm  After hours call [phone]

## 2018-12-25 ENCOUNTER — Other Ambulatory Visit (HOSPITAL_COMMUNITY)
Admission: RE | Admit: 2018-12-25 | Discharge: 2018-12-25 | Disposition: A | Discharge status: Home / Self Care | Payer: Medicare HMO | Source: Skilled Nursing Facility | Attending: Adult Health | Admitting: Adult Health

## 2018-12-25 DIAGNOSIS — Z7901 Long term (current) use of anticoagulants: Secondary | ICD-10-CM | POA: Insufficient documentation

## 2018-12-26 ENCOUNTER — Encounter: Payer: Self-pay | Admitting: Adult Health

## 2018-12-26 ENCOUNTER — Non-Acute Institutional Stay (SKILLED_NURSING_FACILITY): Payer: Medicare HMO | Admitting: Adult Health

## 2018-12-26 ENCOUNTER — Other Ambulatory Visit (HOSPITAL_COMMUNITY)
Admission: RE | Admit: 2018-12-26 | Discharge: 2018-12-26 | Disposition: A | Discharge status: Home / Self Care | Payer: Medicare HMO | Source: Skilled Nursing Facility | Attending: Internal Medicine | Admitting: Internal Medicine

## 2018-12-26 DIAGNOSIS — Z7901 Long term (current) use of anticoagulants: Secondary | ICD-10-CM | POA: Diagnosis not present

## 2018-12-26 DIAGNOSIS — I639 Cerebral infarction, unspecified: Secondary | ICD-10-CM

## 2018-12-26 DIAGNOSIS — Z952 Presence of prosthetic heart valve: Secondary | ICD-10-CM | POA: Diagnosis not present

## 2018-12-26 DIAGNOSIS — I480 Paroxysmal atrial fibrillation: Secondary | ICD-10-CM | POA: Diagnosis not present

## 2018-12-26 LAB — PROTIME-INR
INR: 2.9 — ABNORMAL HIGH (ref 0.8–1.2)
Prothrombin Time: 29.8 seconds — ABNORMAL HIGH (ref 11.4–15.2)

## 2018-12-26 NOTE — Progress Notes (Signed)
Location:   [place] Room Number: [place] of Service:  SNF (31)   CODE STATUS: DNR  No Known Allergies  Chief Complaint  Patient presents with  . Acute Visit    INR    HPI:  He is on long term coumadin therapy for his atrial fibrillation and bioprostetic AVR. There are no reports of missed doses; no reports of abnormal bleeding. He denies any heart palpitations. No reports of fevers present his INR today is 2.9 with a range of 2.5-3.0.   Past Medical History:  Diagnosis Date  . AICD (automatic cardioverter/defibrillator) present   . Alcoholism in remission ([place])   . Anemia   . Aortic insufficiency   . Arthritis   . At moderate risk for fall   . Bell's palsy   . Chronic systolic heart failure (HCC)    a. EF previously 15-20% with cath in 2014 showing no significant CAD b. EF 20-25% by echo in 06/2018  . CKD (chronic kidney disease), stage IV ([place])   . COPD (chronic obstructive pulmonary disease) ([place])   . Essential hypertension, benign   . HIV disease ([place]) 09/06/2016  . Hyperlipidemia   . NICM (nonischemic cardiomyopathy) ([place])   . Noncompliance with medication regimen   . NSVT (nonsustained ventricular tachycardia) ([place])   . Numbness of right jaw    Had a stoke in 01/2013. Numbness is occasional, especially when trying to chew.  . OSA (obstructive sleep apnea)   . Productive cough 08/2013   With brown sputum   . S/P aortic valve replacement with bioprosthetic valve   . Stroke ([place]) 01/2013   weakness of right side from CVA  . Type 2 diabetes mellitus ([place])   . Uses hearing aid 2014   recently received new hearing aids    Past Surgical History:  Procedure Laterality Date  . AV FISTULA PLACEMENT Left 07/20/2018   Procedure: INSERTION ARTERIOVENOUS GORTEX GRAFT  LEFT ARM;  Surgeon: Rosetta Posner, MD;  Location: [facility];  Service: Vascular;  Laterality: Left;  . BIOPSY N/A 04/10/2014   Procedure: GASTRIC BIOPSY;  Surgeon: Daneil Dolin, MD;  Location: [facility];  Service: Endoscopy;  Laterality: N/A;  . BIOPSY  10/12/2015   Procedure: BIOPSY;  Surgeon: Daneil Dolin, MD;  Location: [facility];  Service: Endoscopy;;  stomach bx's  . CARDIAC DEFIBRILLATOR PLACEMENT  11/12/12   [employer] Inogen MINI ICD implanted in [place] at [facility] AVR per Dr Nelly Laurence note  . COLONOSCOPY WITH PROPOFOL N/A 04/10/2014   RMR: Colonic Diverticulosis  . ESOPHAGOGASTRODUODENOSCOPY (EGD) WITH PROPOFOL N/A 04/10/2014   RMR: Mild erosive reflux esophagitis. Multiple antral polyps likely hyperplastic status post removal by hot snare cautery technique. Diffusely abnormal stomach status post gastric biopsy I suspect some of patients anemaia may be due to intermittent oozing from the stomach. It would be difficult and a risky proposition to attempt complete removal of all of his gastric polyps.  . ESOPHAGOGASTRODUODENOSCOPY (EGD) WITH PROPOFOL N/A 10/12/2015   Procedure: ESOPHAGOGASTRODUODENOSCOPY (EGD) WITH PROPOFOL;  Surgeon: Daneil Dolin, MD;  [facility];  Service: Endoscopy;  Laterality: N/A;  5409 - moved to 9:15  . INSERTION OF DIALYSIS CATHETER Right 07/20/2018   Procedure: EXCHANGE OF DIALYSIS CATHETER RIGHT INTERNAL JUGULAR;  Surgeon: Rosetta Posner, MD;  Location: [facility];  Service: Vascular;  Laterality: Right;  . IR FLUORO GUIDE CV  LINE RIGHT  07/09/2018  . IR US GUIDE VASC ACCESS RIGHT  07/09/2018  . POLYPECTOMY N/A 04/10/2014   Procedure: GASTRIC POLYPECTOMY;  Surgeon: Daneil Dolin, MD;  Location: [facility];  Service: Endoscopy;  Laterality: N/A;  . RIGHT HEART CATHETERIZATION N/A 02/06/2014   Procedure: RIGHT HEART CATH;  Surgeon: Larey Dresser, MD;  Location: [facility] CATH LAB;  Service: Cardiovascular;  Laterality: N/A;    Social History   Socioeconomic History  . Marital status: Single    Spouse name: Not on file  . Number of children: Not on file  . Years of  education: Not on file  . Highest education level: Not on file  Occupational History  . Occupation: disabled  Social Needs  . Financial resource strain: Not hard at all  . Food insecurity    Worry: Never true    Inability: Never true  . Transportation needs    Medical: No    Non-medical: No  Tobacco Use  . Smoking status: Former Smoker    Types: Cigarettes    Quit date: 11/05/1993    Years since quitting: 25.1  . Smokeless tobacco: Never Used  Substance and Sexual Activity  . Alcohol use: Not Currently    Comment: former heavy ETOH, none recently  . Drug use: No    Comment: prior history of cocaine use, smoking   . Sexual activity: Yes    Birth control/protection: None  Lifestyle  . Physical activity    Days per week: 4 days    Minutes per session: 20 min  . Stress: Not at all  Relationships  . Social Herbalist on phone: Never    Gets together: Once a week    Attends religious service: Never    Active member of club or organization: No    Attends meetings of clubs or organizations: Never    Relationship status: Never married  . Intimate partner violence    Fear of current or ex partner: No    Emotionally abused: No    Physically abused: No    Forced sexual activity: No  Other Topics Concern  . Not on file  Social History Narrative   Recently moved from [place] to be with family in [place] (2015).  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  Has h/o polysubstance abuse.  Baseline - able to ambulate, mild cognitive delay, able to toilet and shower himself, occasional use of walker/cane.   2019- resident of [facility] Unable to ambulate unassisted.            Family History  Problem Relation Age of Onset  . Kidney disease Mother   . Kidney disease Sister   . Colon cancer Neg Hx       VITAL SIGNS Ht 5\' 3"  (1.6 m)   Wt 142 lb 3.2 oz (64.5 kg)   BMI 25.19 kg/m   Outpatient Encounter Medications as of 12/26/2018   Medication Sig  . acetaminophen (TYLENOL) 325 MG tablet Take 2 tablets (650 mg total) by mouth every 4 (four) hours as needed for mild pain (or temp > 37.5 C (99.5 F)).  Marland Kitchen albuterol (PROVENTIL) (2.5 MG/3ML) 0.083% nebulizer solution Take 3 mLs (2.5 mg total) by nebulization every 4 (four) hours as needed for wheezing or shortness of breath.  Marland Kitchen atorvastatin (LIPITOR) 80 MG tablet Take 80 mg by mouth daily.   . bictegravir-emtricitabine-tenofovir AF (BIKTARVY) 50-200-25 MG TABS tablet Take 1 tablet  by mouth daily.  . calcium carbonate (TUMS EX) 750 MG chewable tablet Chew 1 tablet by mouth 2 (two) times daily.  . Cholecalciferol (VITAMIN D3) 2000 UNITS TABS Take 2,000 mg by mouth daily.  . coal tar (NEUTROGENA T-GEL) 0.5 % shampoo Apply to scalp on shower days (Twice a week) for dandruff and dry scalp Wed., and Sat.  . docusate sodium (COLACE) 100 MG capsule Take 100 mg by mouth daily.  . Fluticasone-Salmeterol (ADVAIR) 100-50 MCG/DOSE AEPB Inhale 1 puff into the lungs daily.  . metoprolol succinate (TOPROL-XL) 25 MG 24 hr tablet Take 1 tablet (25 mg total) by mouth daily.  . multivitamin (RENA-VIT) TABS tablet Take 1 tablet by mouth at bedtime.  . NON FORMULARY Diet Type:  Dysphagia 2 with thin liquids  Fluid restrictions:  1200 cc / 24 hrs Breakfast = (12 oz) 360 ml Lunch = (8 oz) 240 ml Dinner (12 oz) 360 ml  . Nutritional Supplements (FEEDING SUPPLEMENT, NEPRO CARB STEADY,) LIQD Take by mouth daily.  Marland Kitchen omeprazole (PRILOSEC) 20 MG capsule Take 20 mg by mouth at bedtime.  . senna-docusate (SENEXON-S) 8.6-50 MG tablet Take 1 tablet by mouth daily.  . tamsulosin (FLOMAX) 0.4 MG CAPS capsule Take 0.4 mg by mouth daily.  Marland Kitchen UNABLE TO FIND 1.2L fluid restriction - 360 cc 912 oz) at breakfast, 240 cc (8 oz) at Lunch, 240 cc (8 oz) Dinner, 360 cc (12 oz) med pass / snacks om between meals  . warfarin (COUMADIN) 5 MG tablet Take 5 mg by mouth daily.    No facility-administered encounter medications  on file as of 12/26/2018.      SIGNIFICANT DIAGNOSTIC EXAMS  PREVIOUS    07-06-18: chest x-ray: Enlargement of cardiac silhouette with vascular congestion and question minimal pulmonary edema.  07-06-18: ct of head;  1. New age indeterminate RIGHT basal ganglia small infarcts. 2. Old RIGHT temporal occipital/PCA territory infarct. 3. Old basal ganglia, thalami, pontine and cerebellar small infarcts. Moderate to severe chronic small vessel ischemic changes. 4. Moderate parenchymal brain volume loss, advanced for age.  07-07-18: US renal: Medical renal disease changes of both kidneys. Small renal cysts in both kidneys. No evidence of hydronephrosis.  07-11-18: ct of head:  1. Stable head CT, with no new acute intracranial abnormality. 2. In band cerebral atrophy with chronic small vessel ischemic disease with multiple chronic ischemic infarcts as above. 3. Acute pan sinusitis, worsened from previous.  05-12-19 ct of abdomen and pelvis: Diverticulosis without diverticulitis. Bilateral renal cysts. Mild bibasilar atelectasis.  07-12-18: 2-d echo:   1. The left ventricle has severely reduced systolic function, with an ejection fraction of 20-25%. The cavity size was mildly dilated. There is mildly increased left ventricular wall thickness. Left ventricular diastolic Doppler parameters are  consistent with impaired relaxation Left ventricular diffuse hypokinesis.  2. The right ventricle has normal systolic function. The cavity was normal. There is no increase in right ventricular wall thickness.  3. Left atrial size was mildly dilated.  4. The mitral valve is normal in structure. There is mild thickening.  5. The tricuspid valve is normal in structure.  6. AVR.  7. The pulmonic valve was normal in structure.  8. Severe global reduction in LV systolic function; mild LVH; mild LVE; mild LAE; s/p AVR (not well visualized; mean gradient 12 mmHg; AVA 1.5 cm2 but may be underestimated); no  vegetations but cannot R/O with this study; suggest TEE if clinically  indicated.  07-20-18: chest x-ray: Right IJ dialysis  catheter in good position without complicating features. Cardiac enlargement and vascular congestion without overt pulmonary edema.  11-19-18: BAS: thin liquids  NO NEW EXAMS.    LABS REVIEWED PREVIOUS:   05-22-18: hgb a1c 6.5 chol 100 ldl 43; trig 170; hdl 23 07-04-18: wbc  3.7; hgb 14.0; hct 45.6; mcv 91.8; plt 124; glucose 100; bun 45; creat 2.44; k+ 4.7; na++ 139 BNP 679.0  07-06-18: wbc 1.9; hgb 15.2; hct 48.8; mcv 91.2 plt 107; glucose 136; bun 43; creat 3.01; k+ 5.9; na++ 136; ca 8.8 ast 70 alt 46; albumin 3.0 BNP 554.5   blood culture: no growth /e-coli  07-08-18: wbc 13.8; hgb 13.1; hct 41.1; mcv 93.2 plt 73 glucose 108 ;bun 74; creat 5.32; k+ 5.5; na++ 139; ca 7.5; ast 80 alt 117; albumin 2.3  07-09-18; tsh 3.098 07-17-18: wbc 3.1; hgb 1.8; hct 37.3; mcv 89.9 plt 103 glucose 114; bun 95; creat 6.00; k+ 3.5; na++ 139; ca 6.4 mag 2.1 07-27-18: INR 2.3: coumadin 5 mg daily   07-30-18: wbc  6.8; hgb 10.7; hct 33.6; mcv 89.6; plt 94 glucose 90; bun 93; creat 7.47; k+ 4.2; na++ 135; ca 7.1 08-02-18: INR 1.9   08-15-18: HIV quant: <20 CD4: 360  08-16-18: INR 3.2  08-23-18: INR  1.7 09-04-18: hgb 7.4 hct 23.6  glucose 90; bun 16; create 2.64; k+ 2.8; na++ 138; ca 8.3 phos 3.0; albumin 2.9 iron 52; tibc 218; PTH 124 vit 69.3  09-06-18: INR 2.5 guaiac neg  09-07-18: glucose 99; bun 22; creat 3.24; k+ 3.4;na++ 137; ca 8.4  09-11-18 hgb 10.3; hct 33.9 glucose 83; bun 18; creat 2.48; k+ 3.8; na++ 140; ca 8.9 iron 43; tibc 225 PTH 115 vit D 44.6 09-20-18: INR 2.1 09-22-18: guaiac: neg  10-13-18: INR 2.4 10-19-18: INR 2.1 11-02-18: INR 2.2  11-16-18: INR 3.8 11-19-18: INR 2.2 11-26-18: INR 1.6  12-05-18: hgb a1c 6.1 12-10-18: INR 1.9  TODAY:   12-18-18: INR 2.2    Review of Systems  Unable to perform ROS: Dementia (unable to participate )   Physical Exam Constitutional:      General: He  is not in acute distress.    Appearance: He is well-developed. He is not diaphoretic.  Neck:     Musculoskeletal: Neck supple.     Thyroid: No thyromegaly.  Cardiovascular:     Rate and Rhythm: Normal rate. Rhythm irregular.     Pulses: Normal pulses.     Heart sounds: Murmur present.     Comments: 2/6 Pulmonary:     Effort: Pulmonary effort is normal. No respiratory distress.     Breath sounds: Normal breath sounds.  Abdominal:     General: Bowel sounds are normal. There is no distension.     Palpations: Abdomen is soft.     Tenderness: There is no abdominal tenderness.     Comments: 2015: gastric bypass     Musculoskeletal:     Right lower leg: No edema.     Left lower leg: No edema.     Comments: Is able to move all extremities   Lymphadenopathy:     Cervical: No cervical adenopathy.  Skin:    General: Skin is warm and dry.     Comments:  Left arm arm a/v fistula: + thrill + bruit        Neurological:     Mental Status: He is alert. Mental status is at baseline.  Psychiatric:        Mood and Affect:  Mood normal.       ASSESSMENT/ PLAN:  TODAY:   1. PAF (paroxsymal atrial fibrillation) 2. Cerebrovascular accident (CVA)  3. S/p avr (aortic valve replacement) 4. Chronic anticoagulation   For INR 2.9 will continue coumadin 5 mg daily and will check INR 01-01-19.  Will monitor his status.    MD is aware of resident's narcotic use and is in agreement with current plan of care. We will attempt to wean resident as apropriate   Ok Edwards NP [facility]  Contact [phone] Monday through Friday 8am- 5pm  After hours call [phone]

## 2019-01-01 ENCOUNTER — Non-Acute Institutional Stay (SKILLED_NURSING_FACILITY): Payer: Medicare HMO | Admitting: Adult Health

## 2019-01-01 ENCOUNTER — Encounter (HOSPITAL_COMMUNITY)
Admission: RE | Admit: 2019-01-01 | Discharge: 2019-01-01 | Disposition: A | Discharge status: Home / Self Care | Payer: Medicare HMO | Source: Skilled Nursing Facility | Attending: Internal Medicine | Admitting: Internal Medicine

## 2019-01-01 ENCOUNTER — Encounter: Payer: Self-pay | Admitting: Adult Health

## 2019-01-01 DIAGNOSIS — I639 Cerebral infarction, unspecified: Secondary | ICD-10-CM

## 2019-01-01 DIAGNOSIS — I480 Paroxysmal atrial fibrillation: Secondary | ICD-10-CM | POA: Diagnosis not present

## 2019-01-01 DIAGNOSIS — A419 Sepsis, unspecified organism: Secondary | ICD-10-CM | POA: Insufficient documentation

## 2019-01-01 DIAGNOSIS — Z952 Presence of prosthetic heart valve: Secondary | ICD-10-CM

## 2019-01-01 DIAGNOSIS — Z7901 Long term (current) use of anticoagulants: Secondary | ICD-10-CM

## 2019-01-01 DIAGNOSIS — I132 Hypertensive heart and chronic kidney disease with heart failure and with stage 5 chronic kidney disease, or end stage renal disease: Secondary | ICD-10-CM | POA: Insufficient documentation

## 2019-01-01 LAB — PROTIME-INR
INR: 2.3 — ABNORMAL HIGH (ref 0.8–1.2)
Prothrombin Time: 25.3 seconds — ABNORMAL HIGH (ref 11.4–15.2)

## 2019-01-01 NOTE — Progress Notes (Signed)
Location:   [place] Room Number: [place] of Service:  SNF (31)   CODE STATUS: DNR  No Known Allergies  Chief Complaint  Patient presents with  . Acute Visit    INR    HPI:  He is on long term coumadin therapy for his chronic atrial fibrillation and bioprosthetic AVR. There are no reports of abnormal bleeding or bruising present. He denies any heart palpitations. His INR today is 2.3 with his INR range of 2.5-3.0     Past Medical History:  Diagnosis Date  . AICD (automatic cardioverter/defibrillator) present   . Alcoholism in remission ([place])   . Anemia   . Aortic insufficiency   . Arthritis   . At moderate risk for fall   . Bell's palsy   . Chronic systolic heart failure (HCC)    a. EF previously 15-20% with cath in 2014 showing no significant CAD b. EF 20-25% by echo in 06/2018  . CKD (chronic kidney disease), stage IV ([place])   . COPD (chronic obstructive pulmonary disease) ([place])   . Essential hypertension, benign   . HIV disease ([place]) 09/06/2016  . Hyperlipidemia   . NICM (nonischemic cardiomyopathy) ([place])   . Noncompliance with medication regimen   . NSVT (nonsustained ventricular tachycardia) ([place])   . Numbness of right jaw    Had a stoke in 01/2013. Numbness is occasional, especially when trying to chew.  . OSA (obstructive sleep apnea)   . Productive cough 08/2013   With brown sputum   . S/P aortic valve replacement with bioprosthetic valve   . Stroke ([place]) 01/2013   weakness of right side from CVA  . Type 2 diabetes mellitus ([place])   . Uses hearing aid 2014   recently received new hearing aids    Past Surgical History:  Procedure Laterality Date  . AV FISTULA PLACEMENT Left 07/20/2018   Procedure: INSERTION ARTERIOVENOUS GORTEX GRAFT  LEFT ARM;  Surgeon: Rosetta Posner, MD;  Location: [place];  Service: Vascular;  Laterality: Left;  . BIOPSY N/A 04/10/2014   Procedure: GASTRIC BIOPSY;  Surgeon: Daneil Dolin, MD;  Location:  [facility];  Service: Endoscopy;  Laterality: N/A;  . BIOPSY  10/12/2015   Procedure: BIOPSY;  Surgeon: Daneil Dolin, MD;  [facility];  Service: Endoscopy;;  stomach bx's  . CARDIAC DEFIBRILLATOR PLACEMENT  11/12/12   [employer] Inogen MINI ICD implanted in [place] at [place] AVR per Dr Nelly Laurence note  . COLONOSCOPY WITH PROPOFOL N/A 04/10/2014   RMR: Colonic Diverticulosis  . ESOPHAGOGASTRODUODENOSCOPY (EGD) WITH PROPOFOL N/A 04/10/2014   RMR: Mild erosive reflux esophagitis. Multiple antral polyps likely hyperplastic status post removal by hot snare cautery technique. Diffusely abnormal stomach status post gastric biopsy I suspect some of patients anemaia may be due to intermittent oozing from the stomach. It would be difficult and a risky proposition to attempt complete removal of all of his gastric polyps.  . ESOPHAGOGASTRODUODENOSCOPY (EGD) WITH PROPOFOL N/A 10/12/2015   Procedure: ESOPHAGOGASTRODUODENOSCOPY (EGD) WITH PROPOFOL;  Surgeon: Daneil Dolin, MD;  [facility];  Service: Endoscopy;  Laterality: N/A;  4098 - moved to 9:15  . INSERTION OF DIALYSIS CATHETER Right 07/20/2018   Procedure: EXCHANGE OF DIALYSIS CATHETER RIGHT INTERNAL JUGULAR;  Surgeon: Rosetta Posner, MD;  [facility];  Service: Vascular;  Laterality: Right;  . IR FLUORO GUIDE CV LINE RIGHT  07/09/2018  . IR US GUIDE VASC ACCESS RIGHT  07/09/2018  . POLYPECTOMY N/A 04/10/2014   Procedure: GASTRIC POLYPECTOMY;  Surgeon: Daneil Dolin, MD;  Location: [facility];  Service: Endoscopy;  Laterality: N/A;  . RIGHT HEART CATHETERIZATION N/A 02/06/2014   Procedure: RIGHT HEART CATH;  Surgeon: Larey Dresser, MD;  Location: [facility];  Service: Cardiovascular;  Laterality: N/A;    Social History   Socioeconomic History  . Marital status: Single    Spouse name: Not on file  . Number of children: Not on file  . Years of education: Not on file  .  Highest education level: Not on file  Occupational History  . Occupation: disabled  Social Needs  . Financial resource strain: Not hard at all  . Food insecurity    Worry: Never true    Inability: Never true  . Transportation needs    Medical: No    Non-medical: No  Tobacco Use  . Smoking status: Former Smoker    Types: Cigarettes    Quit date: 11/05/1993    Years since quitting: 25.1  . Smokeless tobacco: Never Used  Substance and Sexual Activity  . Alcohol use: Not Currently    Comment: former heavy ETOH, none recently  . Drug use: No    Comment: prior history of cocaine use, smoking   . Sexual activity: Yes    Birth control/protection: None  Lifestyle  . Physical activity    Days per week: 4 days    Minutes per session: 20 min  . Stress: Not at all  Relationships  . Social Herbalist on phone: Never    Gets together: Once a week    Attends religious service: Never    Active member of club or organization: No    Attends meetings of clubs or organizations: Never    Relationship status: Never married  . Intimate partner violence    Fear of current or ex partner: No    Emotionally abused: No    Physically abused: No    Forced sexual activity: No  Other Topics Concern  . Not on file  Social History Narrative   Recently moved from [place] to be with family in [place] (2015).  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  Has h/o polysubstance abuse.  Baseline - able to ambulate, mild cognitive delay, able to toilet and shower himself, occasional use of walker/cane.   2019- resident of [facility]. Unable to ambulate unassisted.            Family History  Problem Relation Age of Onset  . Kidney disease Mother   . Kidney disease Sister   . Colon cancer Neg Hx       VITAL SIGNS BP 100/73   Pulse 76   Temp 98 F (36.7 C)   Ht 5\' 3"  (1.6 m)   Wt 142 lb 9.6 oz (64.7 kg)   BMI 25.26 kg/m   Outpatient Encounter Medications  as of 01/01/2019  Medication Sig  . acetaminophen (TYLENOL) 325 MG tablet Take 2 tablets (650 mg total) by mouth every 4 (four) hours as needed for mild pain (or temp > 37.5 C (99.5 F)).  Marland Kitchen albuterol (PROVENTIL) (2.5 MG/3ML) 0.083% nebulizer solution Take 3 mLs (2.5 mg total) by nebulization every 4 (four) hours as needed for wheezing or shortness of breath.  Marland Kitchen atorvastatin (LIPITOR) 80 MG tablet Take 80 mg by mouth daily.   Marland Kitchen  bictegravir-emtricitabine-tenofovir AF (BIKTARVY) 50-200-25 MG TABS tablet Take 1 tablet by mouth daily.  . calcium carbonate (TUMS EX) 750 MG chewable tablet Chew 1 tablet by mouth 2 (two) times daily.  . Cholecalciferol (VITAMIN D3) 2000 UNITS TABS Take 2,000 mg by mouth daily.  . coal tar (NEUTROGENA T-GEL) 0.5 % shampoo Apply to scalp on shower days (Twice a week) for dandruff and dry scalp Wed., and Sat.  . docusate sodium (COLACE) 100 MG capsule Take 100 mg by mouth daily.  . Fluticasone-Salmeterol (ADVAIR) 100-50 MCG/DOSE AEPB Inhale 1 puff into the lungs daily.  . metoprolol succinate (TOPROL-XL) 25 MG 24 hr tablet Take 1 tablet (25 mg total) by mouth daily.  . multivitamin (RENA-VIT) TABS tablet Take 1 tablet by mouth at bedtime.  . NON FORMULARY Diet Type:  Dysphagia 2 with thin liquids  Fluid restrictions:  1200 cc / 24 hrs Breakfast = (12 oz) 360 ml Lunch = (8 oz) 240 ml Dinner (12 oz) 360 ml  . Nutritional Supplements (FEEDING SUPPLEMENT, NEPRO CARB STEADY,) LIQD Take by mouth daily.  Marland Kitchen omeprazole (PRILOSEC) 20 MG capsule Take 20 mg by mouth at bedtime.  . senna-docusate (SENEXON-S) 8.6-50 MG tablet Take 1 tablet by mouth daily.  . tamsulosin (FLOMAX) 0.4 MG CAPS capsule Take 0.4 mg by mouth daily.  Marland Kitchen UNABLE TO FIND 1.2L fluid restriction - 360 cc 912 oz) at breakfast, 240 cc (8 oz) at Lunch, 240 cc (8 oz) Dinner, 360 cc (12 oz) med pass / snacks om between meals  . warfarin (COUMADIN) 5 MG tablet Take 5 mg by mouth daily.    No facility-administered  encounter medications on file as of 01/01/2019.      SIGNIFICANT DIAGNOSTIC EXAMS  PREVIOUS    07-06-18: chest x-ray: Enlargement of cardiac silhouette with vascular congestion and question minimal pulmonary edema.  07-06-18: ct of head;  1. New age indeterminate RIGHT basal ganglia small infarcts. 2. Old RIGHT temporal occipital/PCA territory infarct. 3. Old basal ganglia, thalami, pontine and cerebellar small infarcts. Moderate to severe chronic small vessel ischemic changes. 4. Moderate parenchymal brain volume loss, advanced for age.  07-07-18: US renal: Medical renal disease changes of both kidneys. Small renal cysts in both kidneys. No evidence of hydronephrosis.  07-11-18: ct of head:  1. Stable head CT, with no new acute intracranial abnormality. 2. In band cerebral atrophy with chronic small vessel ischemic disease with multiple chronic ischemic infarcts as above. 3. Acute pan sinusitis, worsened from previous.  05-12-19 ct of abdomen and pelvis: Diverticulosis without diverticulitis. Bilateral renal cysts. Mild bibasilar atelectasis.  07-12-18: 2-d echo:   1. The left ventricle has severely reduced systolic function, with an ejection fraction of 20-25%. The cavity size was mildly dilated. There is mildly increased left ventricular wall thickness. Left ventricular diastolic Doppler parameters are  consistent with impaired relaxation Left ventricular diffuse hypokinesis.  2. The right ventricle has normal systolic function. The cavity was normal. There is no increase in right ventricular wall thickness.  3. Left atrial size was mildly dilated.  4. The mitral valve is normal in structure. There is mild thickening.  5. The tricuspid valve is normal in structure.  6. AVR.  7. The pulmonic valve was normal in structure.  8. Severe global reduction in LV systolic function; mild LVH; mild LVE; mild LAE; s/p AVR (not well visualized; mean gradient 12 mmHg; AVA 1.5 cm2 but may be  underestimated); no vegetations but cannot R/O with this study; suggest TEE if  clinically  indicated.  07-20-18: chest x-ray: Right IJ dialysis catheter in good position without complicating features. Cardiac enlargement and vascular congestion without overt pulmonary edema.  11-19-18: BAS: thin liquids  NO NEW EXAMS.    LABS REVIEWED PREVIOUS:   05-22-18: hgb a1c 6.5 chol 100 ldl 43; trig 170; hdl 23 07-04-18: wbc  3.7; hgb 14.0; hct 45.6; mcv 91.8; plt 124; glucose 100; bun 45; creat 2.44; k+ 4.7; na++ 139 BNP 679.0  07-06-18: wbc 1.9; hgb 15.2; hct 48.8; mcv 91.2 plt 107; glucose 136; bun 43; creat 3.01; k+ 5.9; na++ 136; ca 8.8 ast 70 alt 46; albumin 3.0 BNP 554.5   blood culture: no growth /e-coli  07-08-18: wbc 13.8; hgb 13.1; hct 41.1; mcv 93.2 plt 73 glucose 108 ;bun 74; creat 5.32; k+ 5.5; na++ 139; ca 7.5; ast 80 alt 117; albumin 2.3  07-09-18; tsh 3.098 07-17-18: wbc 3.1; hgb 1.8; hct 37.3; mcv 89.9 plt 103 glucose 114; bun 95; creat 6.00; k+ 3.5; na++ 139; ca 6.4 mag 2.1 07-27-18: INR 2.3: coumadin 5 mg daily   07-30-18: wbc  6.8; hgb 10.7; hct 33.6; mcv 89.6; plt 94 glucose 90; bun 93; creat 7.47; k+ 4.2; na++ 135; ca 7.1 08-02-18: INR 1.9   08-15-18: HIV quant: <20 CD4: 360  08-16-18: INR 3.2  08-23-18: INR  1.7 09-04-18: hgb 7.4 hct 23.6  glucose 90; bun 16; create 2.64; k+ 2.8; na++ 138; ca 8.3 phos 3.0; albumin 2.9 iron 52; tibc 218; PTH 124 vit 69.3  09-06-18: INR 2.5 guaiac neg  09-07-18: glucose 99; bun 22; creat 3.24; k+ 3.4;na++ 137; ca 8.4  09-11-18 hgb 10.3; hct 33.9 glucose 83; bun 18; creat 2.48; k+ 3.8; na++ 140; ca 8.9 iron 43; tibc 225 PTH 115 vit D 44.6 09-20-18: INR 2.1 09-22-18: guaiac: neg  10-13-18: INR 2.4 10-19-18: INR 2.1 11-02-18: INR 2.2  11-16-18: INR 3.8 11-19-18: INR 2.2 11-26-18: INR 1.6  12-05-18: hgb a1c 6.1 12-10-18: INR 1.9 12-18-18: INR 2.2   TODAY;   01-01-19: INR 2.3    Review of Systems  Unable to perform ROS: Dementia (unable to participate )     Physical Exam Constitutional:      General: He is not in acute distress.    Appearance: He is well-developed. He is not diaphoretic.  Neck:     Thyroid: No thyromegaly.  Cardiovascular:     Rate and Rhythm: Normal rate. Rhythm irregular.     Pulses: Normal pulses.     Heart sounds: Murmur present.     Comments: 2/6 Has ICD Pulmonary:     Effort: Pulmonary effort is normal. No respiratory distress.     Breath sounds: Normal breath sounds.  Abdominal:     General: Bowel sounds are normal. There is no distension.     Palpations: Abdomen is soft.     Tenderness: There is no abdominal tenderness.     Comments:      Musculoskeletal:     Right lower leg: No edema.     Left lower leg: No edema.     Comments:  Is able to move all extremities    Lymphadenopathy:     Cervical: No cervical adenopathy.  Skin:    General: Skin is warm and dry.     Comments: Left arm arm a/v fistula: + thrill + bruit         Neurological:     Mental Status: He is alert. Mental status is at baseline.  Psychiatric:  Mood and Affect: Mood normal.       ASSESSMENT/ PLAN:  TODAY:   1. Cerebrovascular accident (CVA)  2. PAF (paroxysmal atrial fibrillation) 3. S/p avr (aortic valve replacement) 4. Chronic anticoagulation   For his INR 2.3 will continue coumadin 5 mg daily and will check INR on 01-15-19.    MD is aware of resident's narcotic use and is in agreement with current plan of care. We will attempt to wean resident as apropriate   Ok Edwards NP [facility]  Contact [phone] Monday through Friday 8am- 5pm  After hours call [phone]

## 2019-01-02 ENCOUNTER — Non-Acute Institutional Stay (SKILLED_NURSING_FACILITY): Payer: Medicare HMO | Admitting: Adult Health

## 2019-01-02 ENCOUNTER — Encounter: Payer: Self-pay | Admitting: Adult Health

## 2019-01-02 DIAGNOSIS — E1122 Type 2 diabetes mellitus with diabetic chronic kidney disease: Secondary | ICD-10-CM

## 2019-01-02 DIAGNOSIS — E785 Hyperlipidemia, unspecified: Secondary | ICD-10-CM

## 2019-01-02 DIAGNOSIS — E1169 Type 2 diabetes mellitus with other specified complication: Secondary | ICD-10-CM

## 2019-01-02 DIAGNOSIS — N138 Other obstructive and reflux uropathy: Secondary | ICD-10-CM

## 2019-01-02 DIAGNOSIS — K219 Gastro-esophageal reflux disease without esophagitis: Secondary | ICD-10-CM

## 2019-01-02 DIAGNOSIS — I5042 Chronic combined systolic (congestive) and diastolic (congestive) heart failure: Secondary | ICD-10-CM

## 2019-01-02 DIAGNOSIS — N401 Enlarged prostate with lower urinary tract symptoms: Secondary | ICD-10-CM

## 2019-01-02 DIAGNOSIS — B20 Human immunodeficiency virus [HIV] disease: Secondary | ICD-10-CM

## 2019-01-02 DIAGNOSIS — Z95810 Presence of automatic (implantable) cardiac defibrillator: Secondary | ICD-10-CM

## 2019-01-02 DIAGNOSIS — Z992 Dependence on renal dialysis: Secondary | ICD-10-CM

## 2019-01-02 DIAGNOSIS — J438 Other emphysema: Secondary | ICD-10-CM | POA: Diagnosis not present

## 2019-01-02 DIAGNOSIS — D631 Anemia in chronic kidney disease: Secondary | ICD-10-CM

## 2019-01-02 DIAGNOSIS — I120 Hypertensive chronic kidney disease with stage 5 chronic kidney disease or end stage renal disease: Secondary | ICD-10-CM

## 2019-01-02 DIAGNOSIS — N185 Chronic kidney disease, stage 5: Secondary | ICD-10-CM

## 2019-01-02 DIAGNOSIS — N186 End stage renal disease: Secondary | ICD-10-CM

## 2019-01-02 DIAGNOSIS — Z952 Presence of prosthetic heart valve: Secondary | ICD-10-CM

## 2019-01-02 DIAGNOSIS — I69391 Dysphagia following cerebral infarction: Secondary | ICD-10-CM

## 2019-01-02 NOTE — Progress Notes (Signed)
Provider:  Ok Edwards, NP Location:  [place] Room Number: [place] of Service:  SNF ([phone])   PCP: [name], MD Patient Care Team: [name], MD as PCP - General (Internal Medicine) Evans Lance, MD as PCP - Electrophysiology (Cardiology) Harl Bowie Alphonse Guild, MD as PCP - Cardiology (Cardiology) Gala Romney Cristopher Estimable, MD as Consulting Physician (Gastroenterology) Nyoka Cowden Phylis Bougie, NP as Nurse Practitioner ([place]) [facility], [place] ([place])  Extended Emergency Contact Information Primary Emergency Contact: [facility] Address: [address] [place] [address] [place] of [place] Phone: [phone] Mobile Phone: [phone] Relation: Relative Secondary Emergency Contact: [facility] Address: [address] [place] [address] [place] Phone: [phone] Mobile Phone: [phone] Relation: Sister  Code Status: DNR Goals of Care: Advanced Directive information Advanced Directives 01/02/2019  Does Patient Have a Medical Advance Directive? Yes  Type of Advance Directive Out of facility DNR (pink MOST or yellow form)  Does patient want to make changes to medical advance directive? No - Patient declined  Copy of [place] in Chart? -  Would patient like information on creating a medical advance directive? -  Pre-existing out of facility DNR order (yellow form or pink MOST form) Yellow form placed in chart (order not valid for inpatient use)      No Known Allergies   Chief Complaint  Patient presents with  . Annual Exam        HPI: Patient is a 68 y.o. male seen today for an annual comprehensive examination. He has one fall this year without injury. He has not been hospitalized since his admission to this facility. His weight is stable. There are no reports of uncontrolled pain; no changes in his appetite; no  reports of anxiety or agitation. He will continue to monitor his chronic illnesses including: hiv; diabetes hypertension.   Past Medical History:  Diagnosis Date  . AICD (automatic cardioverter/defibrillator) present   . Alcoholism in remission ([place])   . Anemia   . Aortic insufficiency   . Arthritis   . At moderate risk for fall   . Bell's palsy   . Chronic systolic heart failure (HCC)    a. EF previously 15-20% with cath in 2014 showing no significant CAD b. EF 20-25% by echo in 06/2018  . CKD (chronic kidney disease), stage IV ([place])   . COPD (chronic obstructive pulmonary disease) ([place])   . Essential hypertension, benign   . HIV disease ([place]) 09/06/2016  . Hyperlipidemia   . NICM (nonischemic cardiomyopathy) ([place])   . Noncompliance with medication regimen   . NSVT (nonsustained ventricular tachycardia) ([place])   . Numbness of right jaw    Had a stoke in 01/2013. Numbness is occasional, especially when trying to chew.  . OSA (obstructive sleep apnea)   . Productive cough 08/2013   With brown sputum   . S/P aortic valve replacement with bioprosthetic valve   . Stroke ([place]) 01/2013   weakness of right side from CVA  . Type 2 diabetes mellitus ([place])   . Uses hearing aid 2014   recently received new hearing aids   Past Surgical History:  Procedure Laterality Date  . AV FISTULA PLACEMENT Left 07/20/2018   Procedure: INSERTION ARTERIOVENOUS GORTEX GRAFT  LEFT ARM;  Surgeon: Rosetta Posner, MD;  Location: [facility]  OR;  Service: Vascular;  Laterality: Left;  . BIOPSY N/A 04/10/2014   Procedure: GASTRIC BIOPSY;  Surgeon: Daneil Dolin, MD;  Location: [facility] ORS;  Service: Endoscopy;  Laterality: N/A;  . BIOPSY  10/12/2015   Procedure: BIOPSY;  Surgeon: Daneil Dolin, MD;  Location: [facility] ENDO SUITE;  Service: Endoscopy;;  stomach bx's  . CARDIAC DEFIBRILLATOR PLACEMENT  11/12/12   [employer] Inogen MINI ICD implanted in [place] at [place] AVR  per Dr Nelly Laurence note  . COLONOSCOPY WITH PROPOFOL N/A 04/10/2014   RMR: Colonic Diverticulosis  . ESOPHAGOGASTRODUODENOSCOPY (EGD) WITH PROPOFOL N/A 04/10/2014   RMR: Mild erosive reflux esophagitis. Multiple antral polyps likely hyperplastic status post removal by hot snare cautery technique. Diffusely abnormal stomach status post gastric biopsy I suspect some of patients anemaia may be due to intermittent oozing from the stomach. It would be difficult and a risky proposition to attempt complete removal of all of his gastric polyps.  . ESOPHAGOGASTRODUODENOSCOPY (EGD) WITH PROPOFOL N/A 10/12/2015   Procedure: ESOPHAGOGASTRODUODENOSCOPY (EGD) WITH PROPOFOL;  Surgeon: Daneil Dolin, MD;  Location[facility] END[facility] SU[facility];  Service: Endoscopy;  Laterality: N/A;  1610 - moved to 9:15  . INSERTION OF DIALYSIS CATHETER Right 07/20/2018   Procedure: EXCHANGE OF DIALYSIS CATHETER RIGHT INTERNAL JUGULAR;  Surgeon: Rosetta Posner, MD;  Location: [place];  Service: Vascular;  Laterality: Right;  . IR FLUORO GUIDE CV LINE RIGHT  07/09/2018  . IR US GUIDE VASC ACCESS RIGHT  07/09/2018  . POLYPECTOMY N/A 04/10/2014   Procedure: GASTRIC POLYPECTOMY;  Surgeon: Daneil Dolin, MD;  Location: [facility] ORS;  Service: Endoscopy;  Laterality: N/A;  . RIGHT HEART CATHETERIZATION N/A 02/06/2014   Procedure: RIGHT HEART CATH;  Surgeon: Larey Dresser, MD;  Location: [facility] CATH LAB;  Service: Cardiovascular;  Laterality: N/A;    reports that he quit smoking about 25 years ago. His smoking use included cigarettes. He has never used smokeless tobacco. He reports previous alcohol use. He reports that he does not use drugs. Social History   Socioeconomic History  . Marital status: Single    Spouse name: Not on file  . Number of children: Not on file  . Years of education: Not on file  . Highest education level: Not on file  Occupational History  . Occupation: disabled  Social Needs  . Financial resource strain: Not hard at all  . Food  insecurity    Worry: Never true    Inability: Never true  . Transportation needs    Medical: No    Non-medical: No  Tobacco Use  . Smoking status: Former Smoker    Types: Cigarettes    Quit date: 11/05/1993    Years since quitting: 25.1  . Smokeless tobacco: Never Used  Substance and Sexual Activity  . Alcohol use: Not Currently    Comment: former heavy ETOH, none recently  . Drug use: No    Comment: prior history of cocaine use, smoking   . Sexual activity: Yes    Birth control/protection: None  Lifestyle  . Physical activity    Days per week: 0 days    Minutes per session: 0 min  . Stress: Not at all  Relationships  . Social Herbalist on phone: Never    Gets together: Once a week    Attends religious service: Never    Active member of club or organization: No  Attends meetings of clubs or organizations: Never    Relationship status: Never married  . Intimate partner violence    Fear of current or ex partner: No    Emotionally abused: No    Physically abused: No    Forced sexual activity: No  Other Topics Concern  . Not on file  Social History Narrative    Has h/o polysubstance abuse.  Baseline    [facility]- resident of [facility]. Unable to ambulate unassisted.            Family History  Problem Relation Age of Onset  . Kidney disease Mother   . Kidney disease Sister   . Colon cancer Neg Hx     Vitals:   01/02/19 1059  BP: 100/73  Pulse: 76  Temp: 97.6 F (36.4 C)  Weight: 142 lb 9.6 oz (64.7 kg)  Height: 5\' 3"  (1.6 m)   Body mass index is 25.26 kg/m.  Medication Sig  . acetaminophen (TYLENOL) 325 MG tablet Take 2 tablets (650 mg total) by mouth every 4 (four) hours as needed for mild pain (or temp > 37.5 C (99.5 F)).  Marland Kitchen albuterol (PROVENTIL) (2.5 MG/3ML) 0.083% nebulizer solution Take 3 mLs (2.5 mg total) by nebulization every 4 (four) hours as needed for wheezing or shortness of breath.  Marland Kitchen atorvastatin (LIPITOR) 80 MG tablet Take  80 mg by mouth daily.   . bictegravir-emtricitabine-tenofovir AF (BIKTARVY) 50-200-25 MG TABS tablet Take 1 tablet by mouth daily.  . calcium carbonate (TUMS EX) 750 MG chewable tablet Chew 1 tablet by mouth 2 (two) times daily.  . Cholecalciferol (VITAMIN D3) 2000 UNITS TABS Take 2,000 mg by mouth daily.  . coal tar (NEUTROGENA T-GEL) 0.5 % shampoo Apply to scalp on shower days (Twice a week) for dandruff and dry scalp Wed., and Sat.  . docusate sodium (COLACE) 100 MG capsule Take 100 mg by mouth daily.  . Fluticasone-Salmeterol (ADVAIR) 100-50 MCG/DOSE AEPB Inhale 1 puff into the lungs daily.  . metoprolol succinate (TOPROL-XL) 25 MG 24 hr tablet Take 1 tablet (25 mg total) by mouth daily.  . multivitamin (RENA-VIT) TABS tablet Take 1 tablet by mouth at bedtime.  . NON FORMULARY Diet Type:  Dysphagia 2 with thin liquids  Fluid restrictions:  1200 cc / 24 hrs Breakfast = (12 oz) 360 ml Lunch = (8 oz) 240 ml Dinner (12 oz) 360 ml  . Nutritional Supplements (FEEDING SUPPLEMENT, NEPRO CARB STEADY,) LIQD Take by mouth daily.  Marland Kitchen omeprazole (PRILOSEC) 20 MG capsule Take 20 mg by mouth at bedtime.  . senna-docusate (SENEXON-S) 8.6-50 MG tablet Take 1 tablet by mouth daily.  . tamsulosin (FLOMAX) 0.4 MG CAPS capsule Take 0.4 mg by mouth daily.  Marland Kitchen UNABLE TO FIND 1.2L fluid restriction - 360 cc 912 oz) at breakfast, 240 cc (8 oz) at Lunch, 240 cc (8 oz) Dinner, 360 cc (12 oz) med pass / snacks om between meals  . warfarin (COUMADIN) 5 MG tablet Take 5 mg by mouth daily.    No current facility-administered medications on file prior to visit.      SIGNIFICANT DIAGNOSTIC EXAMS   PREVIOUS    07-06-18: chest x-ray: Enlargement of cardiac silhouette with vascular congestion and question minimal pulmonary edema.  07-06-18: ct of head;  1. New age indeterminate RIGHT basal ganglia small infarcts. 2. Old RIGHT temporal occipital/PCA territory infarct. 3. Old basal ganglia, thalami, pontine and  cerebellar small infarcts. Moderate to severe chronic small vessel ischemic changes.  4. Moderate parenchymal brain volume loss, advanced for age.  07-07-18: US renal: Medical renal disease changes of both kidneys. Small renal cysts in both kidneys. No evidence of hydronephrosis.  07-11-18: ct of head:  1. Stable head CT, with no new acute intracranial abnormality. 2. In band cerebral atrophy with chronic small vessel ischemic disease with multiple chronic ischemic infarcts as above. 3. Acute pan sinusitis, worsened from previous.  05-12-19 ct of abdomen and pelvis: Diverticulosis without diverticulitis. Bilateral renal cysts. Mild bibasilar atelectasis.  07-12-18: 2-d echo:   1. The left ventricle has severely reduced systolic function, with an ejection fraction of 20-25%. The cavity size was mildly dilated. There is mildly increased left ventricular wall thickness. Left ventricular diastolic Doppler parameters are  consistent with impaired relaxation Left ventricular diffuse hypokinesis.  2. The right ventricle has normal systolic function. The cavity was normal. There is no increase in right ventricular wall thickness.  3. Left atrial size was mildly dilated.  4. The mitral valve is normal in structure. There is mild thickening.  5. The tricuspid valve is normal in structure.  6. AVR.  7. The pulmonic valve was normal in structure.  8. Severe global reduction in LV systolic function; mild LVH; mild LVE; mild LAE; s/p AVR (not well visualized; mean gradient 12 mmHg; AVA 1.5 cm2 but may be underestimated); no vegetations but cannot R/O with this study; suggest TEE if clinically  indicated.  07-20-18: chest x-ray: Right IJ dialysis catheter in good position without complicating features. Cardiac enlargement and vascular congestion without overt pulmonary edema.  11-19-18: BAS: thin liquids  NO NEW EXAMS.    LABS REVIEWED PREVIOUS:   05-22-18: hgb a1c 6.5 chol 100 ldl 43; trig 170; hdl  23 07-04-18: wbc  3.7; hgb 14.0; hct 45.6; mcv 91.8; plt 124; glucose 100; bun 45; creat 2.44; k+ 4.7; na++ 139 BNP 679.0  07-06-18: wbc 1.9; hgb 15.2; hct 48.8; mcv 91.2 plt 107; glucose 136; bun 43; creat 3.01; k+ 5.9; na++ 136; ca 8.8 ast 70 alt 46; albumin 3.0 BNP 554.5   blood culture: no growth /e-coli  07-08-18: wbc 13.8; hgb 13.1; hct 41.1; mcv 93.2 plt 73 glucose 108 ;bun 74; creat 5.32; k+ 5.5; na++ 139; ca 7.5; ast 80 alt 117; albumin 2.3  07-09-18; tsh 3.098 07-17-18: wbc 3.1; hgb 1.8; hct 37.3; mcv 89.9 plt 103 glucose 114; bun 95; creat 6.00; k+ 3.5; na++ 139; ca 6.4 mag 2.1 07-27-18: INR 2.3: coumadin 5 mg daily   07-30-18: wbc  6.8; hgb 10.7; hct 33.6; mcv 89.6; plt 94 glucose 90; bun 93; creat 7.47; k+ 4.2; na++ 135; ca 7.1 08-02-18: INR 1.9   08-15-18: HIV quant: <20 CD4: 360  08-16-18: INR 3.2  08-23-18: INR  1.7 09-04-18: hgb 7.4 hct 23.6  glucose 90; bun 16; create 2.64; k+ 2.8; na++ 138; ca 8.3 phos 3.0; albumin 2.9 iron 52; tibc 218; PTH 124 vit 69.3  09-06-18: INR 2.5 guaiac neg  09-07-18: glucose 99; bun 22; creat 3.24; k+ 3.4;na++ 137; ca 8.4  09-11-18 hgb 10.3; hct 33.9 glucose 83; bun 18; creat 2.48; k+ 3.8; na++ 140; ca 8.9 iron 43; tibc 225 PTH 115 vit D 44.6 09-20-18: INR 2.1 09-22-18: guaiac: neg  10-13-18: INR 2.4 10-19-18: INR 2.1 11-02-18: INR 2.2  11-16-18: INR 3.8 11-19-18: INR 2.2 11-26-18: INR 1.6  12-05-18: hgb a1c 6.1 12-10-18: INR 1.9 12-18-18: INR 2.2  01-01-19: INR 2.3   NO NEW LABS.  Review of Systems  Unable to perform ROS: Dementia (unable to participate )   Physical Exam Constitutional:      General: He is not in acute distress.    Appearance: He is well-developed. He is not diaphoretic.  HENT:     Right Ear: Tympanic membrane and external ear normal.     Left Ear: Tympanic membrane and external ear normal.     Mouth/Throat:     Mouth: Mucous membranes are moist.     Pharynx: Oropharynx is clear.  Eyes:     Conjunctiva/sclera: Conjunctivae normal.  Neck:      Musculoskeletal: Neck supple.     Thyroid: No thyromegaly.  Cardiovascular:     Rate and Rhythm: Normal rate. Rhythm irregular.     Pulses: Normal pulses.     Heart sounds: Murmur present.     Comments: 2/6 Has ICD Pulmonary:     Effort: Pulmonary effort is normal. No respiratory distress.     Breath sounds: Normal breath sounds.  Abdominal:     General: Bowel sounds are normal. There is no distension.     Palpations: Abdomen is soft.     Tenderness: There is no abdominal tenderness.  Musculoskeletal:     Right lower leg: No edema.     Left lower leg: No edema.     Comments: Is able to move all extremities   Lymphadenopathy:     Cervical: No cervical adenopathy.  Skin:    General: Skin is warm and dry.     Comments: Left arm arm a/v fistula: + thrill + bruit          Neurological:     Mental Status: He is alert. Mental status is at baseline.  Psychiatric:        Mood and Affect: Mood normal.      Assessment/Plan   TODAY:   1. Hypertension associated with stage 5 chronic kidney disease due to type 2 diabetes mellitus: is stable b/p 100/73 will continue toproal xl 25 mg daily   2. PEF (paroxysmal atrial fibrillation) status post bioprosthetic AVR heart rate is stable will continue toprol xl 25 mg daily for rate control is on chronic coumadin therapy  3. Dyslipidemia associated with type 2 diabetes mellitus: is stable LDL 43 will continue lipitor 80 mg daily   4. Type 2 diabetes mellitus with hypertension and end stage renal disease on dialysis: is stable hgb a1c is 6.1; will continue to monitor his status.   5. Chronic kidney disease with end stage renal disease on dialysis due to type 2 diabetes mellitus/dialysis dependent: is without change is being dialyzed 3 times weekly is followed by nephrology; is on 1200 cc fluid restriction.   6. Other emphysema is stable will continue advair 100/50 mcg 1 puff twice daily and has albuterol neb every 4 hours as needed  7.  HIV disease is without change will continue bikatarvy 20-200-25 mg daily is followed by I/D  8. CVA/Multiple CVA is neurologically stable is on chronic coumadin therapy will monitor   9. Anemia due to end stage renal disease is stable hgb 10.3; will monitor  10.  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: is without signs of aspiration; is on thin liquids will monitor  11. Protein calorie malnutrition severe: is without change: albumin 2.9 (prevoius 2.3) weight is 142 pounds will continue supplements as directed  12.  Chronic combined systolic and diastolic heart failure: has ICD is stable EF 20-25% (07-12-18) will continue toprol xl 25  mg daily   13. BPH with obstruction/urinary tract symptoms is stable will continue flomax 0.4 mg daily   14. GERD without esophagitis: will continue prilosec 20 mg daily  15. Chronic constipation is stable will continue colace and senna s daily   On 01-15-19: will check cbc; cmp psa pt/inr    MD is aware of resident's narcotic use and is in agreement with current plan of care. We will wean dosage as appropriate for resident   Ok Edwards NP [facility]  Contact [phone] Monday through Friday 8am- 5pm  After hours call [phone]

## 2019-01-05 ENCOUNTER — Encounter: Payer: Self-pay | Admitting: Adult Health

## 2019-01-10 ENCOUNTER — Inpatient Hospital Stay
Admission: RE | Admit: 2019-01-10 | Discharge: 2019-02-02 | Disposition: A | Discharge status: Still In Hospital | Payer: Medicare HMO | Source: Ambulatory Visit | Attending: Internal Medicine | Admitting: Internal Medicine

## 2019-01-10 ENCOUNTER — Emergency Department (HOSPITAL_COMMUNITY)
Admission: EM | Admit: 2019-01-10 | Discharge: 2019-01-10 | Disposition: A | Discharge status: Home / Self Care | Payer: Medicare HMO | Attending: Emergency Medicine | Admitting: Emergency Medicine

## 2019-01-10 ENCOUNTER — Encounter (HOSPITAL_COMMUNITY): Payer: Self-pay

## 2019-01-10 DIAGNOSIS — E1122 Type 2 diabetes mellitus with diabetic chronic kidney disease: Secondary | ICD-10-CM | POA: Insufficient documentation

## 2019-01-10 DIAGNOSIS — I132 Hypertensive heart and chronic kidney disease with heart failure and with stage 5 chronic kidney disease, or end stage renal disease: Secondary | ICD-10-CM | POA: Diagnosis not present

## 2019-01-10 DIAGNOSIS — I5042 Chronic combined systolic (congestive) and diastolic (congestive) heart failure: Secondary | ICD-10-CM | POA: Insufficient documentation

## 2019-01-10 DIAGNOSIS — Z7901 Long term (current) use of anticoagulants: Secondary | ICD-10-CM | POA: Insufficient documentation

## 2019-01-10 DIAGNOSIS — T82838A Hemorrhage of vascular prosthetic devices, implants and grafts, initial encounter: Secondary | ICD-10-CM | POA: Diagnosis present

## 2019-01-10 DIAGNOSIS — Z87891 Personal history of nicotine dependence: Secondary | ICD-10-CM | POA: Diagnosis not present

## 2019-01-10 DIAGNOSIS — Y828 Other medical devices associated with adverse incidents: Secondary | ICD-10-CM | POA: Diagnosis not present

## 2019-01-10 DIAGNOSIS — Z79899 Other long term (current) drug therapy: Secondary | ICD-10-CM | POA: Diagnosis not present

## 2019-01-10 DIAGNOSIS — B20 Human immunodeficiency virus [HIV] disease: Secondary | ICD-10-CM | POA: Diagnosis not present

## 2019-01-10 DIAGNOSIS — N186 End stage renal disease: Secondary | ICD-10-CM | POA: Diagnosis not present

## 2019-01-10 DIAGNOSIS — J449 Chronic obstructive pulmonary disease, unspecified: Secondary | ICD-10-CM | POA: Insufficient documentation

## 2019-01-10 DIAGNOSIS — Z992 Dependence on renal dialysis: Secondary | ICD-10-CM | POA: Insufficient documentation

## 2019-01-10 DIAGNOSIS — R58 Hemorrhage, not elsewhere classified: Secondary | ICD-10-CM

## 2019-01-10 LAB — CBC WITH DIFFERENTIAL/PLATELET
Abs Immature Granulocytes: 0.01 10*3/uL (ref 0.00–0.07)
Basophils Absolute: 0 10*3/uL (ref 0.0–0.1)
Basophils Relative: 0 %
Eosinophils Absolute: 0.1 10*3/uL (ref 0.0–0.5)
Eosinophils Relative: 3 %
HCT: 37.5 % — ABNORMAL LOW (ref 39.0–52.0)
Hemoglobin: 12.1 g/dL — ABNORMAL LOW (ref 13.0–17.0)
Immature Granulocytes: 0 %
Lymphocytes Relative: 23 %
Lymphs Abs: 0.5 10*3/uL — ABNORMAL LOW (ref 0.7–4.0)
MCH: 31.3 pg (ref 26.0–34.0)
MCHC: 32.3 g/dL (ref 30.0–36.0)
MCV: 96.9 fL (ref 80.0–100.0)
Monocytes Absolute: 0.3 10*3/uL (ref 0.1–1.0)
Monocytes Relative: 12 %
Neutro Abs: 1.4 10*3/uL — ABNORMAL LOW (ref 1.7–7.7)
Neutrophils Relative %: 62 %
Platelets: 101 10*3/uL — ABNORMAL LOW (ref 150–400)
RBC: 3.87 MIL/uL — ABNORMAL LOW (ref 4.22–5.81)
RDW: 18.1 % — ABNORMAL HIGH (ref 11.5–15.5)
WBC: 2.3 10*3/uL — ABNORMAL LOW (ref 4.0–10.5)
nRBC: 0 % (ref 0.0–0.2)

## 2019-01-10 LAB — PROTIME-INR
INR: 3 — ABNORMAL HIGH (ref 0.8–1.2)
Prothrombin Time: 30.3 seconds — ABNORMAL HIGH (ref 11.4–15.2)

## 2019-01-10 NOTE — ED Provider Notes (Signed)
[facility] EMERGENCY DEPARTMENT Provider Note   CSN: [id number] Arrival date & time: 01/10/19  1547     History   Chief Complaint Chief Complaint  Patient presents with  . bleeding from fistula    HPI Keith Young is a 68 y.o. male.     HPI  Pt was seen at 1610. Per EMS and pt report: c/o sudden onset and resolution of one episode of "fistula bleeding" that began approximately 1330 PTA while at HD. Pressure was held at facility until PTA. EMS states they were called at 1509, and fistula dressing was "dry" and "no bleeding noted." Pt himself just states he "was bleeding." Denies any other complaints. Denies CP/SOB, no abd pain, no fevers, no rash.     Past Medical History:  Diagnosis Date  . AICD (automatic cardioverter/defibrillator) present   . Alcoholism in remission ([place])   . Anemia   . Aortic insufficiency   . Arthritis   . At moderate risk for fall   . Bell's palsy   . Chronic systolic heart failure (HCC)    a. EF previously 15-20% with cath in 2014 showing no significant CAD b. EF 20-25% by echo in 06/2018  . CKD (chronic kidney disease), stage IV ([place])   . COPD (chronic obstructive pulmonary disease) ([place])   . Essential hypertension, benign   . HIV disease ([place]) 09/06/2016  . Hyperlipidemia   . NICM (nonischemic cardiomyopathy) ([place])   . Noncompliance with medication regimen   . NSVT (nonsustained ventricular tachycardia) ([place])   . Numbness of right jaw    Had a stoke in 01/2013. Numbness is occasional, especially when trying to chew.  . OSA (obstructive sleep apnea)   . Productive cough 08/2013   With brown sputum   . S/P aortic valve replacement with bioprosthetic valve   . Stroke ([place]) 01/2013   weakness of right side from CVA  . Type 2 diabetes mellitus ([place])   . Type 2 diabetes mellitus with hypertension and end stage renal disease on dialysis ([place]) 07/31/2018  . Uses hearing aid 2014   recently received new hearing aids    Patient Active Problem  List   Diagnosis Date Noted  . Chronic anticoagulation 12/11/2018  . Hypertension associated with stage 5 chronic kidney disease due to type 2 diabetes mellitus ([place]) 12/04/2018  . S/P AVR (aortic valve replacement) 12/04/2018  . Anemia due to end stage renal disease ([place]) 10/02/2018  . ESRD (end stage renal disease) on dialysis ([place]) 08/06/2018  . PAF (paroxysmal atrial fibrillation) ([place]) 07/31/2018  . Dyslipidemia associated with type 2 diabetes mellitus ([place]) 07/31/2018  . Type 2 diabetes mellitus with hypertension and end stage renal disease on dialysis ([place]) 07/31/2018  . Chronic kidney disease with end stage renal disease on dialysis due to type 2 diabetes mellitus ([place]) 07/31/2018  . Dialysis patient ([place]) 07/31/2018  . BPH with obstruction/lower urinary tract symptoms 07/31/2018  . GERD without esophagitis 07/31/2018  . Chronic constipation 07/31/2018  . 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 Malnutrition of moderate degree 07/20/2018  . Diverticulosis   . Sepsis with encephalopathy and septic shock ([place])   . Tachycardia   . E coli bacteremia   . Septic shock ([place]) 07/07/2018  . Chronic combined systolic and diastolic heart failure ([place]) 12/09/2016  . COPD (chronic obstructive pulmonary disease) ([place]) 09/12/2016  . Protein-calorie malnutrition, severe 09/06/2016  . HIV disease ([place]) 09/06/2016  . CVA (cerebral vascular accident) ([place]) 06/26/2016  . ICD (  implantable cardioverter-defibrillator) in place 01/22/2015  . Anemia in chronic kidney disease 03/26/2014  . Carotid bruit 12/18/2013  . Essential hypertension, benign 10/23/2013  . CAD (coronary artery disease) 10/23/2013    Past Surgical History:  Procedure Laterality Date  . AV FISTULA PLACEMENT Left 07/20/2018   Procedure: INSERTION ARTERIOVENOUS GORTEX GRAFT  LEFT ARM;  Surgeon: Rosetta Posner, MD;  Location: [facility];  Service: Vascular;  Laterality: Left;  . BIOPSY N/A 04/10/2014   Procedure: GASTRIC BIOPSY;   Surgeon: Daneil Dolin, MD;  Location: [facility];  Service: Endoscopy;  Laterality: N/A;  . BIOPSY  10/12/2015   Procedure: BIOPSY;  Surgeon: Daneil Dolin, MD;  [facility];  Service: Endoscopy;;  stomach bx's  . CARDIAC DEFIBRILLATOR PLACEMENT  11/12/12   [employer] Inogen MINI ICD implanted in [place] at [place] AVR per Dr Nelly Laurence note  . COLONOSCOPY WITH PROPOFOL N/A 04/10/2014   RMR: Colonic Diverticulosis  . ESOPHAGOGASTRODUODENOSCOPY (EGD) WITH PROPOFOL N/A 04/10/2014   RMR: Mild erosive reflux esophagitis. Multiple antral polyps likely hyperplastic status post removal by hot snare cautery technique. Diffusely abnormal stomach status post gastric biopsy I suspect some of patients anemaia may be due to intermittent oozing from the stomach. It would be difficult and a risky proposition to attempt complete removal of all of his gastric polyps.  . ESOPHAGOGASTRODUODENOSCOPY (EGD) WITH PROPOFOL N/A 10/12/2015   Procedure: ESOPHAGOGASTRODUODENOSCOPY (EGD) WITH PROPOFOL;  Surgeon: Daneil Dolin, MD;  [facility];  Service: Endoscopy;  Laterality: N/A;  8756 - moved to 9:15  . INSERTION OF DIALYSIS CATHETER Right 07/20/2018   Procedure: EXCHANGE OF DIALYSIS CATHETER RIGHT INTERNAL JUGULAR;  Surgeon: Rosetta Posner, MD;  Location: [facility];  Service: Vascular;  Laterality: Right;  . IR FLUORO GUIDE CV LINE RIGHT  07/09/2018  . IR US GUIDE VASC ACCESS RIGHT  07/09/2018  . POLYPECTOMY N/A 04/10/2014   Procedure: GASTRIC POLYPECTOMY;  Surgeon: Daneil Dolin, MD;  Location: [facility];  Service: Endoscopy;  Laterality: N/A;  . RIGHT HEART CATHETERIZATION N/A 02/06/2014   Procedure: RIGHT HEART CATH;  Surgeon: Larey Dresser, MD;  Location: [facility] CATH LAB;  Service: Cardiovascular;  Laterality: N/A;        Home Medications    Prior to Admission medications   Medication Sig Start Date End Date Taking? Authorizing Provider   acetaminophen (TYLENOL) 325 MG tablet Take 2 tablets (650 mg total) by mouth every 4 (four) hours as needed for mild pain (or temp > 37.5 C (99.5 F)). 06/30/16   Sinda Du, MD  albuterol (PROVENTIL) (2.5 MG/3ML) 0.083% nebulizer solution Take 3 mLs (2.5 mg total) by nebulization every 4 (four) hours as needed for wheezing or shortness of breath. 07/26/18   Ghimire, Henreitta Leber, MD  atorvastatin (LIPITOR) 80 MG tablet Take 80 mg by mouth daily.  11/22/16   [provider]  bictegravir-emtricitabine-tenofovir AF (BIKTARVY) 50-200-25 MG TABS tablet Take 1 tablet by mouth daily. 04/18/18   Thayer Headings, MD  calcium carbonate (TUMS EX) 750 MG chewable tablet Chew 1 tablet by mouth 2 (two) times daily. 07/30/18   [provider]  Cholecalciferol (VITAMIN D3) 2000 UNITS TABS Take 2,000 mg by mouth daily.    [provider]  coal tar (NEUTROGENA T-GEL) 0.5 % shampoo Apply to scalp on shower days (Twice a week) for dandruff and dry scalp Wed., and Sat.    [provider]  docusate sodium (COLACE) 100 MG capsule Take 100 mg by mouth daily.    [provider]  Fluticasone-Salmeterol (ADVAIR) 100-50 MCG/DOSE AEPB Inhale 1 puff into the lungs daily. 12/04/18   [provider]  metoprolol succinate (TOPROL-XL) 25 MG 24 hr tablet Take 1 tablet (25 mg total) by mouth daily. 08/08/18   Strader, Fransisco Hertz, PA-C  multivitamin (RENA-VIT) TABS tablet Take 1 tablet by mouth at bedtime. 07/26/18   Ghimire, Henreitta Leber, MD  NON FORMULARY Diet Type:  Dysphagia 2 with thin liquids  Fluid restrictions:  1200 cc / 24 hrs Breakfast = (12 oz) 360 ml Lunch = (8 oz) 240 ml Dinner (12 oz) 360 ml 11/19/18   [provider]  Nutritional Supplements (FEEDING SUPPLEMENT, NEPRO CARB STEADY,) LIQD Take by mouth daily. 11/20/18   [provider]  omeprazole (PRILOSEC) 20 MG capsule Take 20 mg by mouth at bedtime. 09/04/18   [provider]  senna-docusate  (SENEXON-S) 8.6-50 MG tablet Take 1 tablet by mouth daily.    [provider]  tamsulosin (FLOMAX) 0.4 MG CAPS capsule Take 0.4 mg by mouth daily.    [provider]  UNABLE TO FIND 1.2L fluid restriction - 360 cc 912 oz) at breakfast, 240 cc (8 oz) at Lunch, 240 cc (8 oz) Dinner, 360 cc (12 oz) med pass / snacks om between meals 09/06/18   [provider]  warfarin (COUMADIN) 5 MG tablet Take 5 mg by mouth daily.  12/19/18   [provider]    Family History Family History  Problem Relation Age of Onset  . Kidney disease Mother   . Kidney disease Sister   . Colon cancer Neg Hx     Social History Social History   Tobacco Use  . Smoking status: Former Smoker    Types: Cigarettes    Quit date: 11/05/1993    Years since quitting: 25.1  . Smokeless tobacco: Never Used  Substance Use Topics  . Alcohol use: Not Currently    Comment: former heavy ETOH, none recently  . Drug use: No    Comment: prior history of cocaine use, smoking      Allergies   Patient has no known allergies.   Review of Systems Review of Systems ROS: Statement: All systems negative except as marked or noted in the HPI; Constitutional: Negative for fever and chills. ; ; Eyes: Negative for eye pain, redness and discharge. ; ; ENMT: Negative for ear pain, hoarseness, nasal congestion, sinus pressure and sore throat. ; ; Cardiovascular: Negative for chest pain, palpitations, diaphoresis, dyspnea and peripheral edema. ; ; Respiratory: Negative for cough, wheezing and stridor. ; ; Gastrointestinal: Negative for nausea, vomiting, diarrhea, abdominal pain, blood in stool, hematemesis, jaundice and rectal bleeding. . ; ; Genitourinary: Negative for dysuria, flank pain and hematuria. ; ; Musculoskeletal: Negative for back pain and neck pain. Negative for swelling and trauma.; ; Skin: +bleeding. Negative for pruritus, rash, abrasions, blisters, bruising and skin lesion.; ; Neuro: Negative for  headache, lightheadedness and neck stiffness. Negative for weakness, altered level of consciousness, altered mental status, extremity weakness, paresthesias, involuntary movement, seizure and syncope.       Physical Exam Updated Vital Signs BP 96/76 (BP Location: Right Arm)   Pulse 63   Temp 97.7 F (36.5 C) (Oral)   Ht 5\' 6"  (1.676 m)   Wt 65 kg   SpO2 100%   BMI 23.13 kg/m   Physical Exam 1615: Physical examination:  Nursing notes reviewed; Vital signs and O2 SAT reviewed;  Constitutional: Well developed, Well nourished, Well hydrated, In no acute distress; Head:  Normocephalic, atraumatic; Eyes: EOMI, PERRL, No scleral icterus; ENMT: Mouth and pharynx normal, Mucous membranes moist; Neck: Supple, Full range of motion, No lymphadenopathy; Cardiovascular: Regular rate and rhythm, No gallop; Respiratory: Breath sounds clear & equal bilaterally, No wheezes.  Speaking full sentences with ease, Normal respiratory effort/excursion; Chest: Nontender, Movement normal; Abdomen: Soft, Nontender, Nondistended, Normal bowel sounds; Genitourinary: No CVA tenderness; Extremities: Peripheral pulses normal, +left forearm fistula with DSD, no bleeding, +palp thrill.  No tenderness, No edema, No calf edema or asymmetry.; Neuro: AA&Ox3, +very HOH. No facial droop. Speech clear. No gross focal motor or sensory deficits in extremities.; Skin: Color normal, Warm, Dry.   ED Treatments / Results  Labs (all labs ordered are listed, but only abnormal results are displayed)   EKG None  Radiology   Procedures Procedures (including critical care time)  Medications Ordered in ED Medications - No data to display   Initial Impression / Assessment and Plan / ED Course  I have reviewed the triage vital signs and the nursing notes.  Pertinent labs & imaging results that were available during my care of the patient were reviewed by me and considered in my medical decision making (see chart for details).      MDM Reviewed: previous chart, nursing note and vitals Reviewed previous: labs Interpretation: labs    Results for orders placed or performed during the hospital encounter of 01/10/19  CBC with Differential  Result Value Ref Range   WBC 2.3 (L) 4.0 - 10.5 K/uL   RBC 3.87 (L) 4.22 - 5.81 MIL/uL   Hemoglobin 12.1 (L) 13.0 - 17.0 g/dL   HCT 37.5 (L) 39.0 - 52.0 %   MCV 96.9 80.0 - 100.0 fL   MCH 31.3 26.0 - 34.0 pg   MCHC 32.3 30.0 - 36.0 g/dL   RDW 18.1 (H) 11.5 - 15.5 %   Platelets 101 (L) 150 - 400 K/uL   nRBC 0.0 0.0 - 0.2 %   Neutrophils Relative % 62 %   Neutro Abs 1.4 (L) 1.7 - 7.7 K/uL   Lymphocytes Relative 23 %   Lymphs Abs 0.5 (L) 0.7 - 4.0 K/uL   Monocytes Relative 12 %   Monocytes Absolute 0.3 0.1 - 1.0 K/uL   Eosinophils Relative 3 %   Eosinophils Absolute 0.1 0.0 - 0.5 K/uL   Basophils Relative 0 %   Basophils Absolute 0.0 0.0 - 0.1 K/uL   Immature Granulocytes 0 %   Abs Immature Granulocytes 0.01 0.00 - 0.07 K/uL  Protime-INR  Result Value Ref Range   Prothrombin Time 30.3 (H) 11.4 - 15.2 seconds   INR 3.0 (H) 0.8 - 1.2    Keith Young was evaluated in Emergency Department on 01/10/2019 for the symptoms described in the history of present illness. He was evaluated in the context of the global COVID-19 pandemic, which necessitated consideration that the patient might be at risk for infection with the SARS-CoV-2 virus that causes COVID-19. Institutional protocols and algorithms that pertain to the evaluation of patients at risk for COVID-19 are in a state of rapid change based on information released by regulatory bodies including the CDC and federal and state organizations. These policies and algorithms were followed during the patient's care in the ED.   1800:  Labs per baseline. No bleeding from fistula while in the ED. Pt states he wants to  leave "right now." Dx and testing, d/w pt.  Questions answered.  Verb understanding, agreeable to d/c home with outpt  f/u.   Final Clinical Impressions(s) / ED Diagnoses   Final diagnoses:  None    ED Discharge Orders    None       Francine Graven, DO 01/16/19 1252

## 2019-01-10 NOTE — Discharge Instructions (Signed)
Your fistula appears to have stopped bleeding. Follow the instructions given to you by your dialysis center. Take your usual prescriptions as previously directed.  Call your regular medical doctor and your hemodialysis doctor tomorrow to schedule a follow up appointment within the next 2 days.  Return to the Emergency Department immediately sooner if worsening.

## 2019-01-10 NOTE — ED Triage Notes (Signed)
Pt brought in by EMS due to bleeding from fistula. Pt was at [facility] and needle was pulled at 1330 pt started "spurting blood" Pressure was held until EMS was notified at 1509. At this time dressing dry. No blood noted

## 2019-01-11 ENCOUNTER — Encounter: Payer: Self-pay | Admitting: Adult Health

## 2019-01-11 ENCOUNTER — Non-Acute Institutional Stay (SKILLED_NURSING_FACILITY): Payer: Medicare HMO | Admitting: Adult Health

## 2019-01-11 DIAGNOSIS — N186 End stage renal disease: Secondary | ICD-10-CM

## 2019-01-11 DIAGNOSIS — Z992 Dependence on renal dialysis: Secondary | ICD-10-CM | POA: Diagnosis not present

## 2019-01-11 DIAGNOSIS — E1122 Type 2 diabetes mellitus with diabetic chronic kidney disease: Secondary | ICD-10-CM

## 2019-01-11 NOTE — Progress Notes (Signed)
Location:    [facility] [facility] Room Number: 111/W Place of Service:  SNF (31)   CODE STATUS: DNR  No Known Allergies  Chief Complaint  Patient presents with  . Follow-up    Follow Up ED    HPI:  He had gone to dialysis. After he returned to the facility and started bleeding from his shunt. He was taken to the ED for further treatment. His bleeding has been stopped. No report of  any pain; no changes in his appetite; no vertigo. He states that he is feeling good.    Past Medical History:  Diagnosis Date  . AICD (automatic cardioverter/defibrillator) present   . Alcoholism in remission ([place])   . Anemia   . Aortic insufficiency   . Arthritis   . At moderate risk for fall   . Bell's palsy   . Chronic systolic heart failure (HCC)    a. EF previously 15-20% with cath in 2014 showing no significant CAD b. EF 20-25% by echo in 06/2018  . CKD (chronic kidney disease), stage IV ([place])   . COPD (chronic obstructive pulmonary disease) ([place])   . Essential hypertension, benign   . HIV disease ([place]) 09/06/2016  . Hyperlipidemia   . NICM (nonischemic cardiomyopathy) ([place])   . Noncompliance with medication regimen   . NSVT (nonsustained ventricular tachycardia) ([place])   . Numbness of right jaw    Had a stoke in 01/2013. Numbness is occasional, especially when trying to chew.  . OSA (obstructive sleep apnea)   . Productive cough 08/2013   With brown sputum   . S/P aortic valve replacement with bioprosthetic valve   . Stroke ([place]) 01/2013   weakness of right side from CVA  . Type 2 diabetes mellitus ([place])   . 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 Uses hearing aid 2014   recently received new hearing aids    Past Surgical History:  Procedure Laterality Date  . AV FISTULA PLACEMENT Left 07/20/2018   Procedure: INSERTION ARTERIOVENOUS GORTEX GRAFT  LEFT ARM;  Surgeon: Rosetta Posner, MD;  Location: [facility];   Service: Vascular;  Laterality: Left;  . BIOPSY N/A 04/10/2014   Procedure: GASTRIC BIOPSY;  Surgeon: Daneil Dolin, MD;  Location: [facility];  Service: Endoscopy;  Laterality: N/A;  . BIOPSY  10/12/2015   Procedure: BIOPSY;  Surgeon: Daneil Dolin, MD;  [facility];  Service: Endoscopy;;  stomach bx's  . CARDIAC DEFIBRILLATOR PLACEMENT  11/12/12   [employer] Inogen MINI ICD implanted in [place] at [facility] per Dr Nelly Laurence note  . COLONOSCOPY WITH PROPOFOL N/A 04/10/2014   RMR: Colonic Diverticulosis  . ESOPHAGOGASTRODUODENOSCOPY (EGD) WITH PROPOFOL N/A 04/10/2014   RMR: Mild erosive reflux esophagitis. Multiple antral polyps likely hyperplastic status post removal by hot snare cautery technique. Diffusely abnormal stomach status post gastric biopsy I suspect some of patients anemaia may be due to intermittent oozing from the stomach. It would be difficult and a risky proposition to attempt complete removal of all of his gastric polyps.  . ESOPHAGOGASTRODUODENOSCOPY (EGD) WITH PROPOFOL N/A 10/12/2015   Procedure: ESOPHAGOGASTRODUODENOSCOPY (EGD) WITH PROPOFOL;  Surgeon: Daneil Dolin, MD;  [facility];  Service: Endoscopy;  Laterality: N/A;  8101 - moved to 9:15  . INSERTION OF DIALYSIS CATHETER Right 07/20/2018   Procedure: EXCHANGE OF DIALYSIS CATHETER RIGHT INTERNAL  JUGULAR;  Surgeon: Rosetta Posner, MD;  Location: [facility] OR;  Service: Vascular;  Laterality: Right;  . IR FLUORO GUIDE CV LINE RIGHT  07/09/2018  . IR US GUIDE VASC ACCESS RIGHT  07/09/2018  . POLYPECTOMY N/A 04/10/2014   Procedure: GASTRIC POLYPECTOMY;  Surgeon: Daneil Dolin, MD;  Location: [facility];  Service: Endoscopy;  Laterality: N/A;  . RIGHT HEART CATHETERIZATION N/A 02/06/2014   Procedure: RIGHT HEART CATH;  Surgeon: Larey Dresser, MD;  Location: [facility] CATH LAB;  Service: Cardiovascular;  Laterality: N/A;    Social History   Socioeconomic  History  . Marital status: Single    Spouse name: Not on file  . Number of children: Not on file  . Years of education: Not on file  . Highest education level: Not on file  Occupational History  . Occupation: disabled  Social Needs  . Financial resource strain: Not hard at all  . Food insecurity    Worry: Never true    Inability: Never true  . Transportation needs    Medical: No    Non-medical: No  Tobacco Use  . Smoking status: Former Smoker    Types: Cigarettes    Quit date: 11/05/1993    Years since quitting: 25.2  . Smokeless tobacco: Never Used  Substance and Sexual Activity  . Alcohol use: Not Currently    Comment: former heavy ETOH, none recently  . Drug use: No    Comment: prior history of cocaine use, smoking   . Sexual activity: Yes    Birth control/protection: None  Lifestyle  . Physical activity    Days per week: 0 days    Minutes per session: 0 min  . Stress: Not at all  Relationships  . Social Herbalist on phone: Never    Gets together: Once a week    Attends religious service: Never    Active member of club or organization: No    Attends meetings of clubs or organizations: Never    Relationship status: Never married  . Intimate partner violence    Fear of current or ex partner: No    Emotionally abused: No    Physically abused: No    Forced sexual activity: No  Other Topics Concern  . Not on file  Social History Narrative    Has h/o polysubstance abuse.  Baseline    2019- resident [facility]. Unable to ambulate unassisted.            Family History  Problem Relation Age of Onset  . Kidney disease Mother   . Kidney disease Sister   . Colon cancer Neg Hx       VITAL SIGNS BP 117/68   Pulse 91   Temp 98.3 F (36.8 C) (Oral)   Resp 17   Ht 5\' 3"  (1.6 m)   Wt 142 lb 9.6 oz (64.7 kg)   SpO2 94%   BMI 25.26 kg/m   Outpatient Encounter Medications as of 01/11/2019  Medication Sig  . acetaminophen (TYLENOL) 325  MG tablet Take 2 tablets (650 mg total) by mouth every 4 (four) hours as needed for mild pain (or temp > 37.5 C (99.5 F)).  Marland Kitchen albuterol (PROVENTIL) (2.5 MG/3ML) 0.083% nebulizer solution Take 3 mLs (2.5 mg total) by nebulization every 4 (four) hours as needed for wheezing or shortness of breath.  Marland Kitchen atorvastatin (LIPITOR) 80 MG tablet Take 80 mg by mouth daily.   . bictegravir-emtricitabine-tenofovir AF (BIKTARVY)  50-200-25 MG TABS tablet Take 1 tablet by mouth daily.  . calcium carbonate (TUMS EX) 750 MG chewable tablet Chew 1 tablet by mouth 2 (two) times daily.  . Cholecalciferol (VITAMIN D3) 2000 UNITS TABS Take 2,000 mg by mouth daily.  . coal tar (NEUTROGENA T-GEL) 0.5 % shampoo Apply to scalp on shower days (Twice a week) for dandruff and dry scalp Wed., and Sat.  . docusate sodium (COLACE) 100 MG capsule Take 100 mg by mouth daily.  . Fluticasone-Salmeterol (ADVAIR) 100-50 MCG/DOSE AEPB Inhale 1 puff into the lungs daily.  . metoprolol succinate (TOPROL-XL) 25 MG 24 hr tablet Take 1 tablet (25 mg total) by mouth daily.  . multivitamin (RENA-VIT) TABS tablet Take 1 tablet by mouth at bedtime.  . NON FORMULARY Diet Type:  Dysphagia 2 with thin liquids  Fluid restrictions:  1200 cc / 24 hrs Breakfast = (12 oz) 360 ml Lunch = (8 oz) 240 ml Dinner (12 oz) 360 ml  . Nutritional Supplements (FEEDING SUPPLEMENT, NEPRO CARB STEADY,) LIQD Take by mouth daily.  Marland Kitchen omeprazole (PRILOSEC) 20 MG capsule Take 20 mg by mouth at bedtime.  . senna-docusate (SENEXON-S) 8.6-50 MG tablet Take 1 tablet by mouth daily.  . tamsulosin (FLOMAX) 0.4 MG CAPS capsule Take 0.4 mg by mouth daily.  Marland Kitchen UNABLE TO FIND 1.2L fluid restriction - 360 cc 912 oz) at breakfast, 240 cc (8 oz) at Lunch, 240 cc (8 oz) Dinner, 360 cc (12 oz) med pass / snacks om between meals  . warfarin (COUMADIN) 5 MG tablet Take 5 mg by mouth daily.    No facility-administered encounter medications on file as of 01/11/2019.      SIGNIFICANT  DIAGNOSTIC EXAMS   PREVIOUS    07-06-18: chest x-ray: Enlargement of cardiac silhouette with vascular congestion and question minimal pulmonary edema.  07-06-18: ct of head;  1. New age indeterminate RIGHT basal ganglia small infarcts. 2. Old RIGHT temporal occipital/PCA territory infarct. 3. Old basal ganglia, thalami, pontine and cerebellar small infarcts. Moderate to severe chronic small vessel ischemic changes. 4. Moderate parenchymal brain volume loss, advanced for age.  07-07-18: US renal: Medical renal disease changes of both kidneys. Small renal cysts in both kidneys. No evidence of hydronephrosis.  07-11-18: ct of head:  1. Stable head CT, with no new acute intracranial abnormality. 2. In band cerebral atrophy with chronic small vessel ischemic disease with multiple chronic ischemic infarcts as above. 3. Acute pan sinusitis, worsened from previous.  05-12-19 ct of abdomen and pelvis: Diverticulosis without diverticulitis. Bilateral renal cysts. Mild bibasilar atelectasis.  07-12-18: 2-d echo:   1. The left ventricle has severely reduced systolic function, with an ejection fraction of 20-25%. The cavity size was mildly dilated. There is mildly increased left ventricular wall thickness. Left ventricular diastolic Doppler parameters are  consistent with impaired relaxation Left ventricular diffuse hypokinesis.  2. The right ventricle has normal systolic function. The cavity was normal. There is no increase in right ventricular wall thickness.  3. Left atrial size was mildly dilated.  4. The mitral valve is normal in structure. There is mild thickening.  5. The tricuspid valve is normal in structure.  6. AVR.  7. The pulmonic valve was normal in structure.  8. Severe global reduction in LV systolic function; mild LVH; mild LVE; mild LAE; s/p AVR (not well visualized; mean gradient 12 mmHg; AVA 1.5 cm2 but may be underestimated); no vegetations but cannot R/O with this study; suggest  TEE if clinically  indicated.  07-20-18: chest x-ray: Right IJ dialysis catheter in good position without complicating features. Cardiac enlargement and vascular congestion without overt pulmonary edema.  11-19-18: BAS: thin liquids  NO NEW EXAMS.    LABS REVIEWED PREVIOUS:   05-22-18: hgb a1c 6.5 chol 100 ldl 43; trig 170; hdl 23 07-04-18: wbc  3.7; hgb 14.0; hct 45.6; mcv 91.8; plt 124; glucose 100; bun 45; creat 2.44; k+ 4.7; na++ 139 BNP 679.0  07-06-18: wbc 1.9; hgb 15.2; hct 48.8; mcv 91.2 plt 107; glucose 136; bun 43; creat 3.01; k+ 5.9; na++ 136; ca 8.8 ast 70 alt 46; albumin 3.0 BNP 554.5   blood culture: no growth /e-coli  07-08-18: wbc 13.8; hgb 13.1; hct 41.1; mcv 93.2 plt 73 glucose 108 ;bun 74; creat 5.32; k+ 5.5; na++ 139; ca 7.5; ast 80 alt 117; albumin 2.3  07-09-18; tsh 3.098 07-17-18: wbc 3.1; hgb 1.8; hct 37.3; mcv 89.9 plt 103 glucose 114; bun 95; creat 6.00; k+ 3.5; na++ 139; ca 6.4 mag 2.1 07-27-18: INR 2.3: coumadin 5 mg daily   07-30-18: wbc  6.8; hgb 10.7; hct 33.6; mcv 89.6; plt 94 glucose 90; bun 93; creat 7.47; k+ 4.2; na++ 135; ca 7.1 08-02-18: INR 1.9   08-15-18: HIV quant: <20 CD4: 360  08-16-18: INR 3.2  08-23-18: INR  1.7 09-04-18: hgb 7.4 hct 23.6  glucose 90; bun 16; create 2.64; k+ 2.8; na++ 138; ca 8.3 phos 3.0; albumin 2.9 iron 52; tibc 218; PTH 124 vit 69.3  09-06-18: INR 2.5 guaiac neg  09-07-18: glucose 99; bun 22; creat 3.24; k+ 3.4;na++ 137; ca 8.4  09-11-18 hgb 10.3; hct 33.9 glucose 83; bun 18; creat 2.48; k+ 3.8; na++ 140; ca 8.9 iron 43; tibc 225 PTH 115 vit D 44.6 09-20-18: INR 2.1 09-22-18: guaiac: neg  10-13-18: INR 2.4 10-19-18: INR 2.1 11-02-18: INR 2.2  11-16-18: INR 3.8 11-19-18: INR 2.2 11-26-18: INR 1.6  12-05-18: hgb a1c 6.1 12-10-18: INR 1.9 12-18-18: INR 2.2  01-01-19: INR 2.3   NO NEW LABS.    Review of Systems  Unable to perform ROS: Dementia (unable to participate )   Physical Exam Constitutional:      General: He is not in acute distress.     Appearance: He is well-developed. He is not diaphoretic.  Neck:     Musculoskeletal: Neck supple.     Thyroid: No thyromegaly.  Cardiovascular:     Rate and Rhythm: Normal rate. Rhythm irregular.     Pulses: Normal pulses.     Heart sounds: Murmur present.     Comments: 2/6 Has ICD Pulmonary:     Effort: Pulmonary effort is normal. No respiratory distress.     Breath sounds: Normal breath sounds.  Abdominal:     General: Bowel sounds are normal. There is no distension.     Palpations: Abdomen is soft.     Tenderness: There is no abdominal tenderness.  Musculoskeletal:     Right lower leg: No edema.     Left lower leg: No edema.     Comments: Is able to move all extremities   Lymphadenopathy:     Cervical: No cervical adenopathy.  Skin:    General: Skin is warm and dry.     Comments: Left arm arm a/v fistula: + thrill + bruit       Neurological:     Mental Status: He is alert. Mental status is at baseline.  Psychiatric:        Mood  and Affect: Mood normal.      ASSESSMENT/ PLAN:  TODAY:   1. Chronic kidney disease with end stage renal disease on hemodialysis due to type 2 diabetes mellitus 2. Dialysis patient   He is presently stable Will continue to monitor his fistula for signs of bleeding     MD is aware of resident's narcotic use and is in agreement with current plan of care. We will attempt to wean resident as apropriate   Ok Edwards NP [facility]  Contact [phone] Monday through Friday 8am- 5pm  After hours call [phone]

## 2019-01-15 ENCOUNTER — Non-Acute Institutional Stay (SKILLED_NURSING_FACILITY): Payer: Medicare HMO | Admitting: Adult Health

## 2019-01-15 ENCOUNTER — Other Ambulatory Visit (HOSPITAL_COMMUNITY)
Admission: RE | Admit: 2019-01-15 | Discharge: 2019-01-15 | Disposition: A | Discharge status: Home / Self Care | Payer: Medicare HMO | Source: Skilled Nursing Facility | Attending: Adult Health | Admitting: Adult Health

## 2019-01-15 ENCOUNTER — Encounter: Payer: Self-pay | Admitting: Adult Health

## 2019-01-15 DIAGNOSIS — Z7901 Long term (current) use of anticoagulants: Secondary | ICD-10-CM | POA: Diagnosis not present

## 2019-01-15 DIAGNOSIS — I509 Heart failure, unspecified: Secondary | ICD-10-CM | POA: Insufficient documentation

## 2019-01-15 DIAGNOSIS — I132 Hypertensive heart and chronic kidney disease with heart failure and with stage 5 chronic kidney disease, or end stage renal disease: Secondary | ICD-10-CM | POA: Diagnosis present

## 2019-01-15 DIAGNOSIS — Z952 Presence of prosthetic heart valve: Secondary | ICD-10-CM | POA: Diagnosis not present

## 2019-01-15 DIAGNOSIS — N189 Chronic kidney disease, unspecified: Secondary | ICD-10-CM | POA: Diagnosis not present

## 2019-01-15 DIAGNOSIS — I639 Cerebral infarction, unspecified: Secondary | ICD-10-CM

## 2019-01-15 DIAGNOSIS — I480 Paroxysmal atrial fibrillation: Secondary | ICD-10-CM

## 2019-01-15 LAB — COMPREHENSIVE METABOLIC PANEL
ALT: 27 U/L (ref 0–44)
AST: 24 U/L (ref 15–41)
Albumin: 3.2 g/dL — ABNORMAL LOW (ref 3.5–5.0)
Alkaline Phosphatase: 103 U/L (ref 38–126)
Anion gap: 13 (ref 5–15)
BUN: 40 mg/dL — ABNORMAL HIGH (ref 8–23)
CO2: 26 mmol/L (ref 22–32)
Calcium: 8.8 mg/dL — ABNORMAL LOW (ref 8.9–10.3)
Chloride: 97 mmol/L — ABNORMAL LOW (ref 98–111)
Creatinine, Ser: 5.45 mg/dL — ABNORMAL HIGH (ref 0.61–1.24)
GFR calc Af Amer: 12 mL/min — ABNORMAL LOW (ref >60)
GFR calc non Af Amer: 10 mL/min — ABNORMAL LOW (ref >60)
Glucose, Bld: 126 mg/dL — ABNORMAL HIGH (ref 70–99)
Potassium: 3.8 mmol/L (ref 3.5–5.1)
Sodium: 136 mmol/L (ref 135–145)
Total Bilirubin: 1.1 mg/dL (ref 0.3–1.2)
Total Protein: 6.8 g/dL (ref 6.5–8.1)

## 2019-01-15 LAB — CBC
HCT: 39.5 % (ref 39.0–52.0)
Hemoglobin: 12.8 g/dL — ABNORMAL LOW (ref 13.0–17.0)
MCH: 31.1 pg (ref 26.0–34.0)
MCHC: 32.4 g/dL (ref 30.0–36.0)
MCV: 96.1 fL (ref 80.0–100.0)
Platelets: 100 10*3/uL — ABNORMAL LOW (ref 150–400)
RBC: 4.11 MIL/uL — ABNORMAL LOW (ref 4.22–5.81)
RDW: 17.6 % — ABNORMAL HIGH (ref 11.5–15.5)
WBC: 2 10*3/uL — ABNORMAL LOW (ref 4.0–10.5)
nRBC: 0 % (ref 0.0–0.2)

## 2019-01-15 LAB — PSA: Prostatic Specific Antigen: 0.98 ng/mL (ref 0.00–4.00)

## 2019-01-15 LAB — PROTIME-INR
INR: 3.9 — ABNORMAL HIGH (ref 0.8–1.2)
Prothrombin Time: 37.9 seconds — ABNORMAL HIGH (ref 11.4–15.2)

## 2019-01-15 NOTE — Progress Notes (Signed)
Location:   [place] Room Number: [place] of Service:  SNF (31)   CODE STATUS: DNR  No Known Allergies  Chief Complaint  Patient presents with  . Acute Visit    INR    HPI:  He is chronic coumadin therapy for chronic atrial fibrillation and bioprosthetic AVR. His INR goal is 2.5-3.0.  His INR today is 3.9 while taking coumadin 5 mg daily. There are no reports of missed doses no dietary changes. There are no reports of abnormal bruising or recent bleeding. He denies any chest pain or palpitations.   Past Medical History:  Diagnosis Date  . AICD (automatic cardioverter/defibrillator) present   . Alcoholism in remission ([place])   . Anemia   . Aortic insufficiency   . Arthritis   . At moderate risk for fall   . Bell's palsy   . Chronic systolic heart failure (HCC)    a. EF previously 15-20% with cath in 2014 showing no significant CAD b. EF 20-25% by echo in 06/2018  . CKD (chronic kidney disease), stage IV ([place])   . COPD (chronic obstructive pulmonary disease) ([place])   . Essential hypertension, benign   . HIV disease ([place]) 09/06/2016  . Hyperlipidemia   . NICM (nonischemic cardiomyopathy) ([place])   . Noncompliance with medication regimen   . NSVT (nonsustained ventricular tachycardia) ([place])   . Numbness of right jaw    Had a stoke in 01/2013. Numbness is occasional, especially when trying to chew.  . OSA (obstructive sleep apnea)   . Productive cough 08/2013   With brown sputum   . S/P aortic valve replacement with bioprosthetic valve   . Stroke ([place]) 01/2013   weakness of right side from CVA  . Type 2 diabetes mellitus ([place])   . 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 Uses hearing aid 2014   recently received new hearing aids    Past Surgical History:  Procedure Laterality Date  . AV FISTULA PLACEMENT Left 07/20/2018   Procedure: INSERTION ARTERIOVENOUS GORTEX GRAFT  LEFT ARM;   Surgeon: Rosetta Posner, MD;  Location: [facility];  Service: Vascular;  Laterality: Left;  . BIOPSY N/A 04/10/2014   Procedure: GASTRIC BIOPSY;  Surgeon: Daneil Dolin, MD;  Location: [facility];  Service: Endoscopy;  Laterality: N/A;  . BIOPSY  10/12/2015   Procedure: BIOPSY;  Surgeon: Daneil Dolin, MD;  Location: [facility];  Service: Endoscopy;;  stomach bx's  . CARDIAC DEFIBRILLATOR PLACEMENT  11/12/12   [employer] Inogen MINI ICD implanted in [place] at [place] AVR per Dr Nelly Laurence note  . COLONOSCOPY WITH PROPOFOL N/A 04/10/2014   RMR: Colonic Diverticulosis  . ESOPHAGOGASTRODUODENOSCOPY (EGD) WITH PROPOFOL N/A 04/10/2014   RMR: Mild erosive reflux esophagitis. Multiple antral polyps likely hyperplastic status post removal by hot snare cautery technique. Diffusely abnormal stomach status post gastric biopsy I suspect some of patients anemaia may be due to intermittent oozing from the stomach. It would be difficult and a risky proposition to attempt complete removal of all of his gastric polyps.  . ESOPHAGOGASTRODUODENOSCOPY (EGD) WITH PROPOFOL N/A 10/12/2015   Procedure: ESOPHAGOGASTRODUODENOSCOPY (EGD) WITH PROPOFOL;  Surgeon: Daneil Dolin, MD;  [facility];  Service: Endoscopy;  Laterality: N/A;  2800 - moved to 9:15  . INSERTION OF DIALYSIS CATHETER Right 07/20/2018   Procedure: EXCHANGE OF  DIALYSIS CATHETER RIGHT INTERNAL JUGULAR;  Surgeon: Rosetta Posner, MD;  Location: [facility];  Service: Vascular;  Laterality: Right;  . IR FLUORO GUIDE CV LINE RIGHT  07/09/2018  . IR US GUIDE VASC ACCESS RIGHT  07/09/2018  . POLYPECTOMY N/A 04/10/2014   Procedure: GASTRIC POLYPECTOMY;  Surgeon: Daneil Dolin, MD;  Location: [facility];  Service: Endoscopy;  Laterality: N/A;  . RIGHT HEART CATHETERIZATION N/A 02/06/2014   Procedure: RIGHT HEART CATH;  Surgeon: Larey Dresser, MD;  Location: [facility] CATH LAB;  Service: Cardiovascular;  Laterality:  N/A;    Social History   Socioeconomic History  . Marital status: Single    Spouse name: Not on file  . Number of children: Not on file  . Years of education: Not on file  . Highest education level: Not on file  Occupational History  . Occupation: disabled  Social Needs  . Financial resource strain: Not hard at all  . Food insecurity    Worry: Never true    Inability: Never true  . Transportation needs    Medical: No    Non-medical: No  Tobacco Use  . Smoking status: Former Smoker    Types: Cigarettes    Quit date: 11/05/1993    Years since quitting: 25.2  . Smokeless tobacco: Never Used  Substance and Sexual Activity  . Alcohol use: Not Currently    Comment: former heavy ETOH, none recently  . Drug use: No    Comment: prior history of cocaine use, smoking   . Sexual activity: Yes    Birth control/protection: None  Lifestyle  . Physical activity    Days per week: 0 days    Minutes per session: 0 min  . Stress: Not at all  Relationships  . Social Herbalist on phone: Never    Gets together: Once a week    Attends religious service: Never    Active member of club or organization: No    Attends meetings of clubs or organizations: Never    Relationship status: Never married  . Intimate partner violence    Fear of current or ex partner: No    Emotionally abused: No    Physically abused: No    Forced sexual activity: No  Other Topics Concern  . Not on file  Social History Narrative    Has h/o polysubstance abuse.  Baseline    2019- resident of [facility]. Unable to ambulate unassisted.            Family History  Problem Relation Age of Onset  . Kidney disease Mother   . Kidney disease Sister   . Colon cancer Neg Hx       VITAL SIGNS BP 117/68   Pulse 91   Temp 98.3 F (36.8 C)   Resp 17   Ht 5\' 3"  (1.6 m)   Wt 142 lb 9.6 oz (64.7 kg)   BMI 25.26 kg/m   Outpatient Encounter Medications as of 01/15/2019  Medication Sig  .  acetaminophen (TYLENOL) 325 MG tablet Take 2 tablets (650 mg total) by mouth every 4 (four) hours as needed for mild pain (or temp > 37.5 C (99.5 F)).  Marland Kitchen albuterol (PROVENTIL) (2.5 MG/3ML) 0.083% nebulizer solution Take 3 mLs (2.5 mg total) by nebulization every 4 (four) hours as needed for wheezing or shortness of breath.  Marland Kitchen atorvastatin (LIPITOR) 80 MG tablet Take 80 mg by mouth daily.   . bictegravir-emtricitabine-tenofovir AF (BIKTARVY) 50-200-25  MG TABS tablet Take 1 tablet by mouth daily.  . calcium carbonate (TUMS EX) 750 MG chewable tablet Chew 1 tablet by mouth 2 (two) times daily.  . Cholecalciferol (VITAMIN D3) 2000 UNITS TABS Take 2,000 mg by mouth daily.  . coal tar (NEUTROGENA T-GEL) 0.5 % shampoo Apply to scalp on shower days (Twice a week) for dandruff and dry scalp Wed., and Sat.  . docusate sodium (COLACE) 100 MG capsule Take 100 mg by mouth daily.  . Fluticasone-Salmeterol (ADVAIR) 100-50 MCG/DOSE AEPB Inhale 1 puff into the lungs daily.  . metoprolol succinate (TOPROL-XL) 25 MG 24 hr tablet Take 1 tablet (25 mg total) by mouth daily.  . multivitamin (RENA-VIT) TABS tablet Take 1 tablet by mouth at bedtime.  . NON FORMULARY Diet Type:  Dysphagia 2 with thin liquids  Fluid restrictions:  1200 cc / 24 hrs Breakfast = (12 oz) 360 ml Lunch = (8 oz) 240 ml Dinner (12 oz) 360 ml  . Nutritional Supplements (FEEDING SUPPLEMENT, NEPRO CARB STEADY,) LIQD Take by mouth daily.  Marland Kitchen omeprazole (PRILOSEC) 20 MG capsule Take 20 mg by mouth at bedtime.  . senna-docusate (SENEXON-S) 8.6-50 MG tablet Take 1 tablet by mouth daily.  . tamsulosin (FLOMAX) 0.4 MG CAPS capsule Take 0.4 mg by mouth daily.  Marland Kitchen UNABLE TO FIND 1.2L fluid restriction - 360 cc 912 oz) at breakfast, 240 cc (8 oz) at Lunch, 240 cc (8 oz) Dinner, 360 cc (12 oz) med pass / snacks om between meals  . warfarin (COUMADIN) 5 MG tablet Take 5 mg by mouth daily.    No facility-administered encounter medications on file as of  01/15/2019.      SIGNIFICANT DIAGNOSTIC EXAMS   PREVIOUS    07-06-18: chest x-ray: Enlargement of cardiac silhouette with vascular congestion and question minimal pulmonary edema.  07-06-18: ct of head;  1. New age indeterminate RIGHT basal ganglia small infarcts. 2. Old RIGHT temporal occipital/PCA territory infarct. 3. Old basal ganglia, thalami, pontine and cerebellar small infarcts. Moderate to severe chronic small vessel ischemic changes. 4. Moderate parenchymal brain volume loss, advanced for age.  07-07-18: US renal: Medical renal disease changes of both kidneys. Small renal cysts in both kidneys. No evidence of hydronephrosis.  07-11-18: ct of head:  1. Stable head CT, with no new acute intracranial abnormality. 2. In band cerebral atrophy with chronic small vessel ischemic disease with multiple chronic ischemic infarcts as above. 3. Acute pan sinusitis, worsened from previous.  05-12-19 ct of abdomen and pelvis: Diverticulosis without diverticulitis. Bilateral renal cysts. Mild bibasilar atelectasis.  07-12-18: 2-d echo:   1. The left ventricle has severely reduced systolic function, with an ejection fraction of 20-25%. The cavity size was mildly dilated. There is mildly increased left ventricular wall thickness. Left ventricular diastolic Doppler parameters are  consistent with impaired relaxation Left ventricular diffuse hypokinesis.  2. The right ventricle has normal systolic function. The cavity was normal. There is no increase in right ventricular wall thickness.  3. Left atrial size was mildly dilated.  4. The mitral valve is normal in structure. There is mild thickening.  5. The tricuspid valve is normal in structure.  6. AVR.  7. The pulmonic valve was normal in structure.  8. Severe global reduction in LV systolic function; mild LVH; mild LVE; mild LAE; s/p AVR (not well visualized; mean gradient 12 mmHg; AVA 1.5 cm2 but may be underestimated); no vegetations but  cannot R/O with this study; suggest TEE if clinically  indicated.  07-20-18: chest x-ray: Right IJ dialysis catheter in good position without complicating features. Cardiac enlargement and vascular congestion without overt pulmonary edema.  11-19-18: BAS: thin liquids  NO NEW EXAMS.    LABS REVIEWED PREVIOUS:   05-22-18: hgb a1c 6.5 chol 100 ldl 43; trig 170; hdl 23 07-04-18: wbc  3.7; hgb 14.0; hct 45.6; mcv 91.8; plt 124; glucose 100; bun 45; creat 2.44; k+ 4.7; na++ 139 BNP 679.0  07-06-18: wbc 1.9; hgb 15.2; hct 48.8; mcv 91.2 plt 107; glucose 136; bun 43; creat 3.01; k+ 5.9; na++ 136; ca 8.8 ast 70 alt 46; albumin 3.0 BNP 554.5   blood culture: no growth /e-coli  07-08-18: wbc 13.8; hgb 13.1; hct 41.1; mcv 93.2 plt 73 glucose 108 ;bun 74; creat 5.32; k+ 5.5; na++ 139; ca 7.5; ast 80 alt 117; albumin 2.3  07-09-18; tsh 3.098 07-17-18: wbc 3.1; hgb 1.8; hct 37.3; mcv 89.9 plt 103 glucose 114; bun 95; creat 6.00; k+ 3.5; na++ 139; ca 6.4 mag 2.1 07-27-18: INR 2.3: coumadin 5 mg daily   07-30-18: wbc  6.8; hgb 10.7; hct 33.6; mcv 89.6; plt 94 glucose 90; bun 93; creat 7.47; k+ 4.2; na++ 135; ca 7.1 08-02-18: INR 1.9   08-15-18: HIV quant: <20 CD4: 360  08-16-18: INR 3.2  08-23-18: INR  1.7 09-04-18: hgb 7.4 hct 23.6  glucose 90; bun 16; create 2.64; k+ 2.8; na++ 138; ca 8.3 phos 3.0; albumin 2.9 iron 52; tibc 218; PTH 124 vit 69.3  09-06-18: INR 2.5 guaiac neg  09-07-18: glucose 99; bun 22; creat 3.24; k+ 3.4;na++ 137; ca 8.4  09-11-18 hgb 10.3; hct 33.9 glucose 83; bun 18; creat 2.48; k+ 3.8; na++ 140; ca 8.9 iron 43; tibc 225 PTH 115 vit D 44.6 09-20-18: INR 2.1 09-22-18: guaiac: neg  10-13-18: INR 2.4 10-19-18: INR 2.1 11-02-18: INR 2.2  11-16-18: INR 3.8 11-19-18: INR 2.2 11-26-18: INR 1.6  12-05-18: hgb a1c 6.1 12-10-18: INR 1.9 12-18-18: INR 2.2  01-01-19: INR 2.3   NO NEW LABS.    Review of Systems  Unable to perform ROS: Dementia (unable to participate )    Physical Exam Constitutional:       General: He is not in acute distress.    Appearance: He is well-developed. He is not diaphoretic.  Neck:     Musculoskeletal: Neck supple.     Thyroid: No thyromegaly.  Cardiovascular:     Rate and Rhythm: Normal rate. Rhythm irregular.     Pulses: Normal pulses.     Heart sounds: Murmur present.     Comments: 2/6; has ICD  Pulmonary:     Effort: Pulmonary effort is normal. No respiratory distress.     Breath sounds: Normal breath sounds.  Abdominal:     General: Bowel sounds are normal. There is no distension.     Palpations: Abdomen is soft.     Tenderness: There is no abdominal tenderness.  Musculoskeletal:     Right lower leg: No edema.     Left lower leg: No edema.     Comments: Is able to move all extremities   Lymphadenopathy:     Cervical: No cervical adenopathy.  Skin:    General: Skin is warm and dry.     Comments: Left arm arm a/v fistula: + thrill + bruit        Neurological:     Mental Status: He is alert. Mental status is at baseline.  Psychiatric:  Mood and Affect: Mood normal.       ASSESSMENT/ PLAN:  TODAY:   1. PAF (paroxysmal atrial fibrillation) 2. S/p avr (aortic valve replacement) 3. Cerebrovascular accident (CVA 4. Chronic anticoagulation   For his INR of 3.9 will hold coumadin today then begin 4 mg daily and will check INR on 01-22-19.    MD is aware of resident's narcotic use and is in agreement with current plan of care. We will attempt to wean resident as apropriate   Ok Edwards NP [facility]  Contact [phone] Monday through Friday 8am- 5pm  After hours call [phone]

## 2019-01-22 ENCOUNTER — Encounter: Payer: Self-pay | Admitting: Adult Health

## 2019-01-22 ENCOUNTER — Non-Acute Institutional Stay (SKILLED_NURSING_FACILITY): Payer: Medicare HMO | Admitting: Adult Health

## 2019-01-22 ENCOUNTER — Encounter: Payer: Medicare HMO | Admitting: Internal Medicine

## 2019-01-22 ENCOUNTER — Other Ambulatory Visit (HOSPITAL_COMMUNITY)
Admission: RE | Admit: 2019-01-22 | Discharge: 2019-01-22 | Disposition: A | Discharge status: Home / Self Care | Payer: Medicare HMO | Source: Skilled Nursing Facility | Attending: Adult Health | Admitting: Adult Health

## 2019-01-22 DIAGNOSIS — Z7901 Long term (current) use of anticoagulants: Secondary | ICD-10-CM | POA: Insufficient documentation

## 2019-01-22 DIAGNOSIS — I480 Paroxysmal atrial fibrillation: Secondary | ICD-10-CM | POA: Diagnosis not present

## 2019-01-22 DIAGNOSIS — I639 Cerebral infarction, unspecified: Secondary | ICD-10-CM | POA: Diagnosis not present

## 2019-01-22 DIAGNOSIS — Z952 Presence of prosthetic heart valve: Secondary | ICD-10-CM | POA: Diagnosis not present

## 2019-01-22 DIAGNOSIS — Z95810 Presence of automatic (implantable) cardiac defibrillator: Secondary | ICD-10-CM

## 2019-01-22 LAB — PROTIME-INR
INR: 3.1 — ABNORMAL HIGH (ref 0.8–1.2)
Prothrombin Time: 31.1 seconds — ABNORMAL HIGH (ref 11.4–15.2)

## 2019-01-22 NOTE — Progress Notes (Signed)
Location:    [place] Room Number: 111/W Place of Service:  SNF (31)   CODE STATUS: DNR  No Known Allergies  Chief Complaint  Patient presents with  . Acute Visit    INR    HPI:  He is on chronic anticoagulation for his chronic atrial fibrillation and bioprosthetic AVR. His INR goal is 2.5-3.0. hie INR today is 3.1 he is presently taking coumadin 4 mg daily. There are no reports of abnormal bleeding; or bruising. There are no reports of missed doses; no dietary changes. He denies any chest pain or palpitations.   Past Medical History:  Diagnosis Date  . AICD (automatic cardioverter/defibrillator) present   . Alcoholism in remission ([place])   . Anemia   . Aortic insufficiency   . Arthritis   . At moderate risk for fall   . Bell's palsy   . Chronic systolic heart failure (HCC)    a. EF previously 15-20% with cath in 2014 showing no significant CAD b. EF 20-25% by echo in 06/2018  . CKD (chronic kidney disease), stage IV ([place])   . COPD (chronic obstructive pulmonary disease) ([place])   . Essential hypertension, benign   . HIV disease ([place]) 09/06/2016  . Hyperlipidemia   . NICM (nonischemic cardiomyopathy) ([place])   . Noncompliance with medication regimen   . NSVT (nonsustained ventricular tachycardia) ([place])   . Numbness of right jaw    Had a stoke in 01/2013. Numbness is occasional, especially when trying to chew.  . OSA (obstructive sleep apnea)   . Productive cough 08/2013   With brown sputum   . S/P aortic valve replacement with bioprosthetic valve   . Stroke ([place]) 01/2013   weakness of right side from CVA  . Type 2 diabetes mellitus ([place])   . 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 Uses hearing aid 2014   recently received new hearing aids    Past Surgical History:  Procedure Laterality Date  . AV FISTULA PLACEMENT Left 07/20/2018   Procedure: INSERTION ARTERIOVENOUS GORTEX GRAFT  LEFT  ARM;  Surgeon: Rosetta Posner, MD;  Location: [place];  Service: Vascular;  Laterality: Left;  . BIOPSY N/A 04/10/2014   Procedure: GASTRIC BIOPSY;  Surgeon: Daneil Dolin, MD;  Location: [facility];  Service: Endoscopy;  Laterality: N/A;  . BIOPSY  10/12/2015   Procedure: BIOPSY;  Surgeon: Daneil Dolin, MD;  Location: [facility];  Service: Endoscopy;;  stomach bx's  . CARDIAC DEFIBRILLATOR PLACEMENT  11/12/12   [employer] Inogen MINI ICD implanted in [place] at [facility] per Dr Nelly Laurence note  . COLONOSCOPY WITH PROPOFOL N/A 04/10/2014   RMR: Colonic Diverticulosis  . ESOPHAGOGASTRODUODENOSCOPY (EGD) WITH PROPOFOL N/A 04/10/2014   RMR: Mild erosive reflux esophagitis. Multiple antral polyps likely hyperplastic status post removal by hot snare cautery technique. Diffusely abnormal stomach status post gastric biopsy I suspect some of patients anemaia may be due to intermittent oozing from the stomach. It would be difficult and a risky proposition to attempt complete removal of all of his gastric polyps.  . ESOPHAGOGASTRODUODENOSCOPY (EGD) WITH PROPOFOL N/A 10/12/2015   Procedure: ESOPHAGOGASTRODUODENOSCOPY (EGD) WITH PROPOFOL;  Surgeon: Daneil Dolin, MD;  [facility];  Service: Endoscopy;  Laterality: N/A;  [id number] - moved to 9:15  . INSERTION OF DIALYSIS CATHETER Right 07/20/2018   Procedure: EXCHANGE OF  DIALYSIS CATHETER RIGHT INTERNAL JUGULAR;  Surgeon: Rosetta Posner, MD;  Location: [facility];  Service: Vascular;  Laterality: Right;  . IR FLUORO GUIDE CV LINE RIGHT  07/09/2018  . IR US GUIDE VASC ACCESS RIGHT  07/09/2018  . POLYPECTOMY N/A 04/10/2014   Procedure: GASTRIC POLYPECTOMY;  Surgeon: Daneil Dolin, MD;  Location: [facility];  Service: Endoscopy;  Laterality: N/A;  . RIGHT HEART CATHETERIZATION N/A 02/06/2014   Procedure: RIGHT HEART CATH;  Surgeon: Larey Dresser, MD;  Location: [facility] CATH LAB;  Service: Cardiovascular;   Laterality: N/A;    Social History   Socioeconomic History  . Marital status: Single    Spouse name: Not on file  . Number of children: Not on file  . Years of education: Not on file  . Highest education level: Not on file  Occupational History  . Occupation: disabled  Social Needs  . Financial resource strain: Not hard at all  . Food insecurity    Worry: Never true    Inability: Never true  . Transportation needs    Medical: No    Non-medical: No  Tobacco Use  . Smoking status: Former Smoker    Types: Cigarettes    Quit date: 11/05/1993    Years since quitting: 25.2  . Smokeless tobacco: Never Used  Substance and Sexual Activity  . Alcohol use: Not Currently    Comment: former heavy ETOH, none recently  . Drug use: No    Comment: prior history of cocaine use, smoking   . Sexual activity: Yes    Birth control/protection: None  Lifestyle  . Physical activity    Days per week: 0 days    Minutes per session: 0 min  . Stress: Not at all  Relationships  . Social Herbalist on phone: Never    Gets together: Once a week    Attends religious service: Never    Active member of club or organization: No    Attends meetings of clubs or organizations: Never    Relationship status: Never married  . Intimate partner violence    Fear of current or ex partner: No    Emotionally abused: No    Physically abused: No    Forced sexual activity: No  Other Topics Concern  . Not on file  Social History Narrative    Has h/o polysubstance abuse.  Baseline    2019- resident of [facility]. Unable to ambulate unassisted.            Family History  Problem Relation Age of Onset  . Kidney disease Mother   . Kidney disease Sister   . Colon cancer Neg Hx       VITAL SIGNS BP 127/80   Pulse 88   Temp 98.2 F (36.8 C) (Oral)   Resp 17   Ht 5\' 3"  (1.6 m)   Wt 142 lb 9.6 oz (64.7 kg)   SpO2 94%   BMI 25.26 kg/m   Outpatient Encounter Medications as of  01/22/2019  Medication Sig  . acetaminophen (TYLENOL) 325 MG tablet Take 2 tablets (650 mg total) by mouth every 4 (four) hours as needed for mild pain (or temp > 37.5 C (99.5 F)).  Marland Kitchen albuterol (PROVENTIL) (2.5 MG/3ML) 0.083% nebulizer solution Take 3 mLs (2.5 mg total) by nebulization every 4 (four) hours as needed for wheezing or shortness of breath.  Marland Kitchen atorvastatin (LIPITOR) 80 MG tablet Take 80 mg by mouth daily.   Marland Kitchen  bictegravir-emtricitabine-tenofovir AF (BIKTARVY) 50-200-25 MG TABS tablet Take 1 tablet by mouth daily.  . calcium carbonate (TUMS EX) 750 MG chewable tablet Chew 1 tablet by mouth 2 (two) times daily.  . Cholecalciferol (VITAMIN D3) 2000 UNITS TABS Take 2,000 mg by mouth daily.  . coal tar (NEUTROGENA T-GEL) 0.5 % shampoo Apply to scalp on shower days (Twice a week) for dandruff and dry scalp Wed., and Sat.  . docusate sodium (COLACE) 100 MG capsule Take 100 mg by mouth daily.  . Fluticasone-Salmeterol (ADVAIR) 100-50 MCG/DOSE AEPB Inhale 1 puff into the lungs daily.  . metoprolol succinate (TOPROL-XL) 25 MG 24 hr tablet Take 1 tablet (25 mg total) by mouth daily.  . multivitamin (RENA-VIT) TABS tablet Take 1 tablet by mouth at bedtime.  . NON FORMULARY Diet Type:  Dysphagia 2 with thin liquids  Fluid restrictions:  1200 cc / 24 hrs Breakfast = (12 oz) 360 ml Lunch = (8 oz) 240 ml Dinner (12 oz) 360 ml  . Nutritional Supplements (FEEDING SUPPLEMENT, NEPRO CARB STEADY,) LIQD Take by mouth daily.  Marland Kitchen omeprazole (PRILOSEC) 20 MG capsule Take 20 mg by mouth at bedtime.  . senna-docusate (SENEXON-S) 8.6-50 MG tablet Take 1 tablet by mouth daily.  . tamsulosin (FLOMAX) 0.4 MG CAPS capsule Take 0.4 mg by mouth daily.  Marland Kitchen UNABLE TO FIND 1.2L fluid restriction - 360 cc 912 oz) at breakfast, 240 cc (8 oz) at Lunch, 240 cc (8 oz) Dinner, 360 cc (12 oz) med pass / snacks om between meals  . warfarin (COUMADIN) 1 MG tablet Take 1 mg by mouth at bedtime. Total dose is 3.5 mg at bedtime  .  warfarin (COUMADIN) 2.5 MG tablet Take 2.5 mg by mouth at bedtime.  . [DISCONTINUED] warfarin (COUMADIN) 5 MG tablet Take 5 mg by mouth daily.    No facility-administered encounter medications on file as of 01/22/2019.      SIGNIFICANT DIAGNOSTIC EXAMS   PREVIOUS    07-06-18: chest x-ray: Enlargement of cardiac silhouette with vascular congestion and question minimal pulmonary edema.  07-06-18: ct of head;  1. New age indeterminate RIGHT basal ganglia small infarcts. 2. Old RIGHT temporal occipital/PCA territory infarct. 3. Old basal ganglia, thalami, pontine and cerebellar small infarcts. Moderate to severe chronic small vessel ischemic changes. 4. Moderate parenchymal brain volume loss, advanced for age.  07-07-18: US renal: Medical renal disease changes of both kidneys. Small renal cysts in both kidneys. No evidence of hydronephrosis.  07-11-18: ct of head:  1. Stable head CT, with no new acute intracranial abnormality. 2. In band cerebral atrophy with chronic small vessel ischemic disease with multiple chronic ischemic infarcts as above. 3. Acute pan sinusitis, worsened from previous.  05-12-19 ct of abdomen and pelvis: Diverticulosis without diverticulitis. Bilateral renal cysts. Mild bibasilar atelectasis.  07-12-18: 2-d echo:   1. The left ventricle has severely reduced systolic function, with an ejection fraction of 20-25%. The cavity size was mildly dilated. There is mildly increased left ventricular wall thickness. Left ventricular diastolic Doppler parameters are  consistent with impaired relaxation Left ventricular diffuse hypokinesis.  2. The right ventricle has normal systolic function. The cavity was normal. There is no increase in right ventricular wall thickness.  3. Left atrial size was mildly dilated.  4. The mitral valve is normal in structure. There is mild thickening.  5. The tricuspid valve is normal in structure.  6. AVR.  7. The pulmonic valve was normal in  structure.  8. Severe global reduction  in LV systolic function; mild LVH; mild LVE; mild LAE; s/p AVR (not well visualized; mean gradient 12 mmHg; AVA 1.5 cm2 but may be underestimated); no vegetations but cannot R/O with this study; suggest TEE if clinically  indicated.  07-20-18: chest x-ray: Right IJ dialysis catheter in good position without complicating features. Cardiac enlargement and vascular congestion without overt pulmonary edema.  11-19-18: BAS: thin liquids  NO NEW EXAMS.    LABS REVIEWED PREVIOUS:   05-22-18: hgb a1c 6.5 chol 100 ldl 43; trig 170; hdl 23 07-04-18: wbc  3.7; hgb 14.0; hct 45.6; mcv 91.8; plt 124; glucose 100; bun 45; creat 2.44; k+ 4.7; na++ 139 BNP 679.0  07-06-18: wbc 1.9; hgb 15.2; hct 48.8; mcv 91.2 plt 107; glucose 136; bun 43; creat 3.01; k+ 5.9; na++ 136; ca 8.8 ast 70 alt 46; albumin 3.0 BNP 554.5   blood culture: no growth /e-coli  07-08-18: wbc 13.8; hgb 13.1; hct 41.1; mcv 93.2 plt 73 glucose 108 ;bun 74; creat 5.32; k+ 5.5; na++ 139; ca 7.5; ast 80 alt 117; albumin 2.3  07-09-18; tsh 3.098 07-17-18: wbc 3.1; hgb 1.8; hct 37.3; mcv 89.9 plt 103 glucose 114; bun 95; creat 6.00; k+ 3.5; na++ 139; ca 6.4 mag 2.1 07-27-18: INR 2.3: coumadin 5 mg daily   07-30-18: wbc  6.8; hgb 10.7; hct 33.6; mcv 89.6; plt 94 glucose 90; bun 93; creat 7.47; k+ 4.2; na++ 135; ca 7.1 08-02-18: INR 1.9   08-15-18: HIV quant: <20 CD4: 360  08-16-18: INR 3.2  08-23-18: INR  1.7 09-04-18: hgb 7.4 hct 23.6  glucose 90; bun 16; create 2.64; k+ 2.8; na++ 138; ca 8.3 phos 3.0; albumin 2.9 iron 52; tibc 218; PTH 124 vit 69.3  09-06-18: INR 2.5 guaiac neg  09-07-18: glucose 99; bun 22; creat 3.24; k+ 3.4;na++ 137; ca 8.4  09-11-18 hgb 10.3; hct 33.9 glucose 83; bun 18; creat 2.48; k+ 3.8; na++ 140; ca 8.9 iron 43; tibc 225 PTH 115 vit D 44.6 09-20-18: INR 2.1 09-22-18: guaiac: neg  10-13-18: INR 2.4 10-19-18: INR 2.1 11-02-18: INR 2.2  11-16-18: INR 3.8 11-19-18: INR 2.2 11-26-18: INR 1.6  12-05-18: hgb  a1c 6.1 12-10-18: INR 1.9 12-18-18: INR 2.2  01-01-19: INR 2.3   TODAY;   01-10-19: wbc 2.3; hgb 12.1; hct 37.5; mcv 96.9; plt 101; INR 3.0 01-15-19: wbc 2.0; hgb 12.8; hct 39.5; mcv 96.1; plt 100; glucose 126; bun 40; creat 5.45; k+ 3.8; na++ 136; ca 8.8; liver normal albumin 3.2; PSA 0.98; INR 3.9 01-22-19; INR 3.1     Review of Systems  Unable to perform ROS: Dementia (unable to fully participate )    Physical Exam Constitutional:      General: He is not in acute distress.    Appearance: He is well-developed. He is not diaphoretic.  Neck:     Musculoskeletal: Neck supple.     Thyroid: No thyromegaly.  Cardiovascular:     Rate and Rhythm: Normal rate. Rhythm irregular.     Heart sounds: Murmur present.     Comments: 2/6; has ICD  Pulmonary:     Effort: Pulmonary effort is normal. No respiratory distress.     Breath sounds: Normal breath sounds.  Abdominal:     General: Bowel sounds are normal. There is no distension.     Palpations: Abdomen is soft.     Tenderness: There is no abdominal tenderness.  Musculoskeletal:     Right lower leg: No edema.  Left lower leg: No edema.     Comments: Is able to move all extremities   Lymphadenopathy:     Cervical: No cervical adenopathy.  Skin:    General: Skin is warm and dry.     Comments: Left arm arm a/v fistula: + thrill + bruit         Neurological:     Mental Status: He is alert. Mental status is at baseline.  Psychiatric:        Mood and Affect: Mood normal.       ASSESSMENT/ PLAN:  TODAY:   1. Cerebrovascular accident (CVA)  2. PAF (paroxysmal atrial fibrillation) 3. S/p avr (aortic valve replacement) 4. Chronic anticoagulation   For his INR 3.1 will begin coumadin 3.5 mg daily and will check INR on 01-28-19    MD is aware of resident's narcotic use and is in agreement with current plan of care. We will attempt to wean resident as apropriate   Ok Edwards NP [facility]  Contact [phone]  Monday through Friday 8am- 5pm  After hours call [phone]

## 2019-01-29 ENCOUNTER — Encounter (HOSPITAL_COMMUNITY)
Admission: RE | Admit: 2019-01-29 | Discharge: 2019-01-29 | Disposition: A | Discharge status: Home / Self Care | Payer: Medicare HMO | Source: Skilled Nursing Facility | Attending: Internal Medicine | Admitting: Internal Medicine

## 2019-01-29 ENCOUNTER — Non-Acute Institutional Stay (SKILLED_NURSING_FACILITY): Payer: Medicare HMO | Admitting: Adult Health

## 2019-01-29 ENCOUNTER — Encounter: Payer: Self-pay | Admitting: Adult Health

## 2019-01-29 DIAGNOSIS — I480 Paroxysmal atrial fibrillation: Secondary | ICD-10-CM

## 2019-01-29 DIAGNOSIS — I639 Cerebral infarction, unspecified: Secondary | ICD-10-CM

## 2019-01-29 DIAGNOSIS — I132 Hypertensive heart and chronic kidney disease with heart failure and with stage 5 chronic kidney disease, or end stage renal disease: Secondary | ICD-10-CM | POA: Diagnosis present

## 2019-01-29 DIAGNOSIS — Z7901 Long term (current) use of anticoagulants: Secondary | ICD-10-CM | POA: Diagnosis not present

## 2019-01-29 DIAGNOSIS — A419 Sepsis, unspecified organism: Secondary | ICD-10-CM | POA: Diagnosis present

## 2019-01-29 DIAGNOSIS — Z952 Presence of prosthetic heart valve: Secondary | ICD-10-CM | POA: Diagnosis not present

## 2019-01-29 LAB — PROTIME-INR
INR: 2.7 — ABNORMAL HIGH (ref 0.8–1.2)
Prothrombin Time: 28 seconds — ABNORMAL HIGH (ref 11.4–15.2)

## 2019-01-29 NOTE — Progress Notes (Signed)
Location:    [facility] Room Number: 111/W Place of Service:  SNF (31)   CODE STATUS: DNR  No Known Allergies  Chief Complaint  Patient presents with   Anticoagulation        HPI:  He is chronic coumadin therapy for his chronic atrial fibrillation and bioprosthetic AVR. His INR goal is 2.5-3.0.  His INR today is 2.7 and he is taking 3.5 mg daily. There are no reports of abnormal or bruising present. He denies any chest pain or palpitations.    Past Medical History:  Diagnosis Date   AICD (automatic cardioverter/defibrillator) present    Alcoholism in remission ([place])    Anemia    Aortic insufficiency    Arthritis    At moderate risk for fall    Bell's palsy    Chronic systolic heart failure ([place])    a. EF previously 15-20% with cath in 2014 showing no significant CAD b. EF 20-25% by echo in 06/2018   CKD (chronic kidney disease), stage IV (HCC)    COPD (chronic obstructive pulmonary disease) ([place])    Essential hypertension, benign    HIV disease ([place]) 09/06/2016   Hyperlipidemia    NICM (nonischemic cardiomyopathy) ([place])    Noncompliance with medication regimen    NSVT (nonsustained ventricular tachycardia) (HCC)    Numbness of right jaw    Had a stoke in 01/2013. Numbness is occasional, especially when trying to chew.   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 ([place]) 07/31/2018   Uses hearing aid 2014   recently received new hearing aids    Past Surgical History:  Procedure Laterality Date   AV FISTULA PLACEMENT Left 07/20/2018   Procedure: INSERTION ARTERIOVENOUS GORTEX GRAFT  LEFT ARM;  Surgeon: Rosetta Posner, MD;  Location: [facility];  Service: Vascular;   Laterality: Left;   BIOPSY N/A 04/10/2014   Procedure: GASTRIC BIOPSY;  Surgeon: Daneil Dolin, MD;  Location: [facility];  Service: Endoscopy;  Laterality: N/A;   BIOPSY  10/12/2015   Procedure: BIOPSY;  Surgeon: Daneil Dolin, MD;  Location: [facility];  Service: Endoscopy;;  stomach bx's   CARDIAC DEFIBRILLATOR PLACEMENT  11/12/12   [employer] Inogen MINI ICD implanted in [place] at [facility] AVR per Dr Nelly Laurence note   COLONOSCOPY WITH PROPOFOL N/A 04/10/2014   RMR: Colonic Diverticulosis   ESOPHAGOGASTRODUODENOSCOPY (EGD) WITH PROPOFOL N/A 04/10/2014   RMR: Mild erosive reflux esophagitis. Multiple antral polyps likely hyperplastic status post removal by hot snare cautery technique. Diffusely abnormal stomach status post gastric biopsy I suspect some of patients anemaia may be due to intermittent oozing from the stomach. It would be difficult and a risky proposition to attempt complete removal of all of his gastric polyps.   ESOPHAGOGASTRODUODENOSCOPY (EGD) WITH PROPOFOL N/A 10/12/2015   Procedure: ESOPHAGOGASTRODUODENOSCOPY (EGD) WITH PROPOFOL;  Surgeon: Daneil Dolin, MD;  [facility];  Service: Endoscopy;  Laterality: N/A;  [id number] - moved to [place] Right 07/20/2018   Procedure: EXCHANGE OF DIALYSIS CATHETER RIGHT INTERNAL JUGULAR;  Surgeon: Rosetta Posner,  MD;  Location: [facility];  Service: Vascular;  Laterality: Right;   IR FLUORO GUIDE CV LINE RIGHT  07/09/2018   IR US GUIDE VASC ACCESS RIGHT  07/09/2018   POLYPECTOMY N/A 04/10/2014   Procedure: GASTRIC POLYPECTOMY;  Surgeon: Daneil Dolin, MD;  Location: [facility];  Service: Endoscopy;  Laterality: N/A;   RIGHT HEART CATHETERIZATION N/A 02/06/2014   Procedure: RIGHT HEART CATH;  Surgeon: Larey Dresser, MD;  Location: [facility] CATH LAB;  Service: Cardiovascular;  Laterality: N/A;    Social History   Socioeconomic History   Marital  status: Single    Spouse name: Not on file   Number of children: Not on file   Years of education: Not on file   Highest education level: Not on file  Occupational History   Occupation: disabled  Social Designer, fashion/clothing strain: Not hard at all   Food insecurity    Worry: Never true    Inability: Never true   Transportation needs    Medical: No    Non-medical: No  Tobacco Use   Smoking status: Former Smoker    Types: Cigarettes    Quit date: 11/05/1993    Years since quitting: 25.2   Smokeless tobacco: Never Used  Substance and Sexual Activity   Alcohol use: Not Currently    Comment: former heavy ETOH, none recently   Drug use: No    Comment: prior history of cocaine use, smoking    Sexual activity: Yes    Birth control/protection: None  Lifestyle   Physical activity    Days per week: 0 days    Minutes per session: 0 min   Stress: Not at all  Relationships   Social connections    Talks on phone: Never    Gets together: Once a week    Attends religious service: Never    Active member of club or organization: No    Attends meetings of clubs or organizations: Never    Relationship status: Never married   Intimate partner violence    Fear of current or ex partner: No    Emotionally abused: No    Physically abused: No    Forced sexual activity: No  Other Topics Concern   Not on file  Social History Narrative    Has h/o polysubstance abuse.  Baseline    2019- [facility]. Unable to ambulate unassisted.            Family History  Problem Relation Age of Onset   Kidney disease Mother    Kidney disease Sister    Colon cancer Neg Hx       VITAL SIGNS BP 95/72    Pulse 80    Temp 98.2 F (36.8 C) (Oral)    Resp 20    Ht 5\' 3"  (1.6 m)    Wt 142 lb 9.6 oz (64.7 kg)    SpO2 94%    BMI 25.26 kg/m   Outpatient Encounter Medications as of 01/29/2019  Medication Sig   acetaminophen (TYLENOL) 325 MG tablet Take 2  tablets (650 mg total) by mouth every 4 (four) hours as needed for mild pain (or temp > 37.5 C (99.5 F)).   albuterol (PROVENTIL) (2.5 MG/3ML) 0.083% nebulizer solution Take 3 mLs (2.5 mg total) by nebulization every 4 (four) hours as needed for wheezing or shortness of breath.   atorvastatin (LIPITOR) 80 MG tablet Take 80 mg by mouth daily.    bictegravir-emtricitabine-tenofovir AF (  BIKTARVY) 50-200-25 MG TABS tablet Take 1 tablet by mouth daily.   calcium carbonate (TUMS EX) 750 MG chewable tablet Chew 1 tablet by mouth 2 (two) times daily.   Cholecalciferol (VITAMIN D3) 2000 UNITS TABS Take 2,000 mg by mouth daily.   coal tar (NEUTROGENA T-GEL) 0.5 % shampoo Apply to scalp on shower days (Twice a week) for dandruff and dry scalp Wed., and Sat.   docusate sodium (COLACE) 100 MG capsule Take 100 mg by mouth daily.   Fluticasone-Salmeterol (ADVAIR) 100-50 MCG/DOSE AEPB Inhale 1 puff into the lungs daily.   metoprolol succinate (TOPROL-XL) 25 MG 24 hr tablet Take 1 tablet (25 mg total) by mouth daily.   multivitamin (RENA-VIT) TABS tablet Take 1 tablet by mouth at bedtime.   NON FORMULARY Diet Type:  Dysphagia 2 with thin liquids  Fluid restrictions:  1200 cc / 24 hrs Breakfast = (12 oz) 360 ml Lunch = (8 oz) 240 ml Dinner (12 oz) 360 ml   Nutritional Supplements (FEEDING SUPPLEMENT, NEPRO CARB STEADY,) LIQD Take by mouth daily.   omeprazole (PRILOSEC) 20 MG capsule Take 20 mg by mouth at bedtime.   senna-docusate (SENEXON-S) 8.6-50 MG tablet Take 1 tablet by mouth daily.   tamsulosin (FLOMAX) 0.4 MG CAPS capsule Take 0.4 mg by mouth daily.   UNABLE TO FIND 1.2L fluid restriction - 360 cc 912 oz) at breakfast, 240 cc (8 oz) at Lunch, 240 cc (8 oz) Dinner, 360 cc (12 oz) med pass / snacks om between meals   warfarin (COUMADIN) 1 MG tablet Take 1 mg by mouth at bedtime. Total dose is 3.5 mg at bedtime   warfarin (COUMADIN) 2.5 MG tablet Take 2.5 mg by mouth at bedtime.   No  facility-administered encounter medications on file as of 01/29/2019.      SIGNIFICANT DIAGNOSTIC EXAMS   PREVIOUS    07-06-18: chest x-ray: Enlargement of cardiac silhouette with vascular congestion and question minimal pulmonary edema.  07-06-18: ct of head;  1. New age indeterminate RIGHT basal ganglia small infarcts. 2. Old RIGHT temporal occipital/PCA territory infarct. 3. Old basal ganglia, thalami, pontine and cerebellar small infarcts. Moderate to severe chronic small vessel ischemic changes. 4. Moderate parenchymal brain volume loss, advanced for age.  07-07-18: US renal: Medical renal disease changes of both kidneys. Small renal cysts in both kidneys. No evidence of hydronephrosis.  07-11-18: ct of head:  1. Stable head CT, with no new acute intracranial abnormality. 2. In band cerebral atrophy with chronic small vessel ischemic disease with multiple chronic ischemic infarcts as above. 3. Acute pan sinusitis, worsened from previous.  05-12-19 ct of abdomen and pelvis: Diverticulosis without diverticulitis. Bilateral renal cysts. Mild bibasilar atelectasis.  07-12-18: 2-d echo:   1. The left ventricle has severely reduced systolic function, with an ejection fraction of 20-25%. The cavity size was mildly dilated. There is mildly increased left ventricular wall thickness. Left ventricular diastolic Doppler parameters are  consistent with impaired relaxation Left ventricular diffuse hypokinesis.  2. The right ventricle has normal systolic function. The cavity was normal. There is no increase in right ventricular wall thickness.  3. Left atrial size was mildly dilated.  4. The mitral valve is normal in structure. There is mild thickening.  5. The tricuspid valve is normal in structure.  6. AVR.  7. The pulmonic valve was normal in structure.  8. Severe global reduction in LV systolic function; mild LVH; mild LVE; mild LAE; s/p AVR (not well visualized; mean gradient 12  mmHg; AVA  1.5 cm2 but may be underestimated); no vegetations but cannot R/O with this study; suggest TEE if clinically  indicated.  07-20-18: chest x-ray: Right IJ dialysis catheter in good position without complicating features. Cardiac enlargement and vascular congestion without overt pulmonary edema.  11-19-18: BAS: thin liquids  NO NEW EXAMS.    LABS REVIEWED PREVIOUS:   05-22-18: hgb a1c 6.5 chol 100 ldl 43; trig 170; hdl 23 07-04-18: wbc  3.7; hgb 14.0; hct 45.6; mcv 91.8; plt 124; glucose 100; bun 45; creat 2.44; k+ 4.7; na++ 139 BNP 679.0  07-06-18: wbc 1.9; hgb 15.2; hct 48.8; mcv 91.2 plt 107; glucose 136; bun 43; creat 3.01; k+ 5.9; na++ 136; ca 8.8 ast 70 alt 46; albumin 3.0 BNP 554.5   blood culture: no growth /e-coli  07-08-18: wbc 13.8; hgb 13.1; hct 41.1; mcv 93.2 plt 73 glucose 108 ;bun 74; creat 5.32; k+ 5.5; na++ 139; ca 7.5; ast 80 alt 117; albumin 2.3  07-09-18; tsh 3.098 07-17-18: wbc 3.1; hgb 1.8; hct 37.3; mcv 89.9 plt 103 glucose 114; bun 95; creat 6.00; k+ 3.5; na++ 139; ca 6.4 mag 2.1 07-27-18: INR 2.3: coumadin 5 mg daily   07-30-18: wbc  6.8; hgb 10.7; hct 33.6; mcv 89.6; plt 94 glucose 90; bun 93; creat 7.47; k+ 4.2; na++ 135; ca 7.1 08-02-18: INR 1.9   08-15-18: HIV quant: <20 CD4: 360  08-16-18: INR 3.2  08-23-18: INR  1.7 09-04-18: hgb 7.4 hct 23.6  glucose 90; bun 16; create 2.64; k+ 2.8; na++ 138; ca 8.3 phos 3.0; albumin 2.9 iron 52; tibc 218; PTH 124 vit 69.3  09-06-18: INR 2.5 guaiac neg  09-07-18: glucose 99; bun 22; creat 3.24; k+ 3.4;na++ 137; ca 8.4  09-11-18 hgb 10.3; hct 33.9 glucose 83; bun 18; creat 2.48; k+ 3.8; na++ 140; ca 8.9 iron 43; tibc 225 PTH 115 vit D 44.6 09-20-18: INR 2.1 09-22-18: guaiac: neg  10-13-18: INR 2.4 10-19-18: INR 2.1 11-02-18: INR 2.2  11-16-18: INR 3.8 11-19-18: INR 2.2 11-26-18: INR 1.6  12-05-18: hgb a1c 6.1 12-10-18: INR 1.9 12-18-18: INR 2.2  01-01-19: INR 2.3  01-10-19: wbc 2.3; hgb 12.1; hct 37.5; mcv 96.9; plt 101; INR 3.0 01-15-19: wbc 2.0; hgb  12.8; hct 39.5; mcv 96.1; plt 100; glucose 126; bun 40; creat 5.45; k+ 3.8; na++ 136; ca 8.8; liver normal albumin 3.2; PSA 0.98; INR 3.9 01-22-19; INR 3.1   TODAY;   01-29-19: INR 2.7    Review of Systems  Unable to perform ROS: Dementia (unable to participate )     Physical Exam Constitutional:      General: He is not in acute distress.    Appearance: He is well-developed. He is not diaphoretic.  Neck:     Musculoskeletal: Neck supple.     Thyroid: No thyromegaly.  Cardiovascular:     Rate and Rhythm: Normal rate. Rhythm irregular.     Pulses: Normal pulses.     Heart sounds: Murmur present.     Comments: 2/6 ICD present  Pulmonary:     Effort: Pulmonary effort is normal. No respiratory distress.     Breath sounds: Normal breath sounds.  Abdominal:     General: Bowel sounds are normal. There is no distension.     Palpations: Abdomen is soft.     Tenderness: There is no abdominal tenderness.  Musculoskeletal:     Right lower leg: No edema.     Left lower leg: No edema.  Comments: Is able to move all extremities    Lymphadenopathy:     Cervical: No cervical adenopathy.  Skin:    General: Skin is warm and dry.     Comments: Left arm arm a/v fistula: + thrill + bruit          Neurological:     Mental Status: He is alert. Mental status is at baseline.  Psychiatric:        Mood and Affect: Mood normal.      ASSESSMENT/ PLAN:  TODAY:   1. PAF (paroxysmal atrial fibrillation)  2. Cerebrovascular accident (CVA)  3. S/p avr (aortic valve replacement) 4. Chronic anticoagulation  For INR 2.7 will continue coumadin 3.5 mg daily and will repeat INR on 02-12-19.    MD is aware of resident's narcotic use and is in agreement with current plan of care. We will attempt to wean resident as appropriate.  Ok Edwards NP [facility]  Contact [phone] Monday through Friday 8am- 5pm  After hours call [phone]

## 2019-01-30 ENCOUNTER — Encounter: Payer: Medicare HMO | Admitting: *Deleted

## 2019-01-31 ENCOUNTER — Non-Acute Institutional Stay (SKILLED_NURSING_FACILITY): Payer: Medicare HMO | Admitting: Adult Health

## 2019-01-31 ENCOUNTER — Encounter: Payer: Self-pay | Admitting: Adult Health

## 2019-01-31 DIAGNOSIS — E1122 Type 2 diabetes mellitus with diabetic chronic kidney disease: Secondary | ICD-10-CM | POA: Diagnosis not present

## 2019-01-31 DIAGNOSIS — N185 Chronic kidney disease, stage 5: Secondary | ICD-10-CM

## 2019-01-31 DIAGNOSIS — I480 Paroxysmal atrial fibrillation: Secondary | ICD-10-CM | POA: Diagnosis not present

## 2019-01-31 DIAGNOSIS — E1169 Type 2 diabetes mellitus with other specified complication: Secondary | ICD-10-CM | POA: Diagnosis not present

## 2019-01-31 DIAGNOSIS — E785 Hyperlipidemia, unspecified: Secondary | ICD-10-CM

## 2019-01-31 DIAGNOSIS — Z952 Presence of prosthetic heart valve: Secondary | ICD-10-CM

## 2019-01-31 DIAGNOSIS — I120 Hypertensive chronic kidney disease with stage 5 chronic kidney disease or end stage renal disease: Secondary | ICD-10-CM

## 2019-01-31 NOTE — Progress Notes (Signed)
Location:    [place] Room Number: 111/W Place of Service:  SNF (31)   CODE STATUS: DNR  No Known Allergies  Chief Complaint  Patient presents with  . Medical Management of Chronic Issues        Hypertension associated with stage 5 chronic kidney disease due to type 2 diabetes mellitus:   PAF(paroxsymal atrial fibrillation)   Dyslipidemia associated with type 2 diabetes mellitus:    HPI:  Keith is a 68 year Young long term resident of this facility being seen for the management of his chronic illnesses: hypertension; paf; dyslipidemia. There are no reports of changes in his appetite; no reports of anxiety or agitation; no reports of uncontrolled pain.   Past Medical History:  Diagnosis Date  . AICD (automatic cardioverter/defibrillator) present   . Alcoholism in remission ([place])   . Anemia   . Aortic insufficiency   . Arthritis   . At moderate risk for fall   . Bell's palsy   . Chronic systolic heart failure (HCC)    a. EF previously 15-20% with cath in 2014 showing no significant CAD b. EF 20-25% by echo in 06/2018  . CKD (chronic kidney disease), stage IV ([place])   . COPD (chronic obstructive pulmonary disease) ([place])   . Essential hypertension, benign   . HIV disease ([place]) 09/06/2016  . Hyperlipidemia   . NICM (nonischemic cardiomyopathy) ([place])   . Noncompliance with medication regimen   . NSVT (nonsustained ventricular tachycardia) ([place])   . Numbness of right jaw    Had a stoke in 01/2013. Numbness is occasional, especially when trying to chew.  . OSA (obstructive sleep apnea)   . Productive cough 08/2013   With brown sputum   . S/P aortic valve replacement with bioprosthetic valve   . Stroke ([place]) 01/2013   weakness of right side from CVA  . Type 2 diabetes mellitus ([place])   . Type 2 diabetes mellitus with hypertension and end stage renal disease on dialysis ([place]) 07/31/2018  . Uses hearing aid 2014   recently received new hearing aids    Past  Surgical History:  Procedure Laterality Date  . AV FISTULA PLACEMENT Left 07/20/2018   Procedure: INSERTION ARTERIOVENOUS GORTEX GRAFT  LEFT ARM;  Surgeon: Rosetta Posner, MD;  Location: [facility];  Service: Vascular;  Laterality: Left;  . BIOPSY N/A 04/10/2014   Procedure: GASTRIC BIOPSY;  Surgeon: Daneil Dolin, MD;  Location: [facility];  Service: Endoscopy;  Laterality: N/A;  . BIOPSY  10/12/2015   Procedure: BIOPSY;  Surgeon: Daneil Dolin, MD;  [facility];  Service: Endoscopy;;  stomach bx's  . CARDIAC DEFIBRILLATOR PLACEMENT  11/12/12   [employer] Inogen MINI ICD implanted in [place] at [place] AVR per Dr Nelly Laurence note  . COLONOSCOPY WITH PROPOFOL N/A 04/10/2014   RMR: Colonic Diverticulosis  . ESOPHAGOGASTRODUODENOSCOPY (EGD) WITH PROPOFOL N/A 04/10/2014   RMR: Mild erosive reflux esophagitis. Multiple antral polyps likely hyperplastic status post removal by hot snare cautery technique. Diffusely abnormal stomach status post gastric biopsy I suspect some of patients anemaia may be due to intermittent oozing from the stomach. It would be difficult and a risky proposition to attempt complete removal of all of his gastric polyps.  . ESOPHAGOGASTRODUODENOSCOPY (EGD) WITH PROPOFOL N/A 10/12/2015   Procedure: ESOPHAGOGASTRODUODENOSCOPY (EGD) WITH PROPOFOL;  Surgeon: Daneil Dolin, MD;  [facility];  Service: Endoscopy;  Laterality: N/A;  9476 - moved to 9:15  . INSERTION OF DIALYSIS CATHETER Right 07/20/2018   Procedure: EXCHANGE OF DIALYSIS CATHETER RIGHT INTERNAL JUGULAR;  Surgeon: Rosetta Posner, MD;  Location: [facility];  Service: Vascular;  Laterality: Right;  . IR FLUORO GUIDE CV LINE RIGHT  07/09/2018  . IR US GUIDE VASC ACCESS RIGHT  07/09/2018  . POLYPECTOMY N/A 04/10/2014   Procedure: GASTRIC POLYPECTOMY;  Surgeon: Daneil Dolin, MD;  Location: [facility];  Service: Endoscopy;  Laterality: N/A;  . RIGHT HEART  CATHETERIZATION N/A 02/06/2014   Procedure: RIGHT HEART CATH;  Surgeon: Larey Dresser, MD;  Location: [facility] CATH LAB;  Service: Cardiovascular;  Laterality: N/A;    Social History   Socioeconomic History  . Marital status: Single    Spouse name: Not on file  . Number of children: Not on file  . Years of education: Not on file  . Highest education level: Not on file  Occupational History  . Occupation: disabled  Social Needs  . Financial resource strain: Not hard at all  . Food insecurity    Worry: Never true    Inability: Never true  . Transportation needs    Medical: No    Non-medical: No  Tobacco Use  . Smoking status: Former Smoker    Types: Cigarettes    Quit date: 11/05/1993    Years since quitting: 25.2  . Smokeless tobacco: Never Used  Substance and Sexual Activity  . Alcohol use: Not Currently    Comment: former heavy ETOH, none recently  . Drug use: No    Comment: prior history of cocaine use, smoking   . Sexual activity: Yes    Birth control/protection: None  Lifestyle  . Physical activity    Days per week: 0 days    Minutes per session: 0 min  . Stress: Not at all  Relationships  . Social Herbalist on phone: Never    Gets together: Once a week    Attends religious service: Never    Active member of club or organization: No    Attends meetings of clubs or organizations: Never    Relationship status: Never married  . Intimate partner violence    Fear of current or ex partner: No    Emotionally abused: No    Physically abused: No    Forced sexual activity: No  Other Topics Concern  . Not on file  Social History Narrative    Has h/o polysubstance abuse.  Baseline    2019- resident of [facility]. Unable to ambulate unassisted.            Family History  Problem Relation Age of Onset  . Kidney disease Mother   . Kidney disease Sister   . Colon cancer Neg Hx       VITAL SIGNS BP 95/72   Pulse 80   Temp (!) 97 F (36.1 C)  (Oral)   Resp 20   Ht 5\' 3"  (1.6 m)   Wt 143 lb 12.8 oz (65.2 kg)   SpO2 94%   BMI 25.47 kg/m   Outpatient Encounter Medications as of 01/31/2019  Medication Sig  . acetaminophen (TYLENOL) 325 MG tablet Take 2 tablets (650 mg total) by mouth every 4 (four) hours as needed for mild pain (or temp > 37.5 C (99.5 F)).  Marland Kitchen albuterol (PROVENTIL) (2.5 MG/3ML) 0.083% nebulizer solution Take 3 mLs (2.5 mg total) by nebulization every 4 (four) hours as  needed for wheezing or shortness of breath.  Marland Kitchen atorvastatin (LIPITOR) 80 MG tablet Take 80 mg by mouth daily.   . bictegravir-emtricitabine-tenofovir AF (BIKTARVY) 50-200-25 MG TABS tablet Take 1 tablet by mouth daily.  . calcium carbonate (TUMS EX) 750 MG chewable tablet Chew 1 tablet by mouth 2 (two) times daily.  . Cholecalciferol (VITAMIN D3) 2000 UNITS TABS Take 2,000 mg by mouth daily.  . coal tar (NEUTROGENA T-GEL) 0.5 % shampoo Apply to scalp on shower days (Twice a week) for dandruff and dry scalp Wed., and Sat.  . docusate sodium (COLACE) 100 MG capsule Take 100 mg by mouth daily.  . Fluticasone-Salmeterol (ADVAIR) 100-50 MCG/DOSE AEPB Inhale 1 puff into the lungs daily.  . metoprolol succinate (TOPROL-XL) 25 MG 24 hr tablet Take 1 tablet (25 mg total) by mouth daily.  . midodrine (PROAMATINE) 10 MG tablet Take 10 mg by mouth. Send medication with resident to be given at center. Once a day on Tues.,Thurs., Sat.  . multivitamin (RENA-VIT) TABS tablet Take 1 tablet by mouth at bedtime.  . NON FORMULARY Diet Type:  Dysphagia 2 with thin liquids  Fluid restrictions:  1200 cc / 24 hrs Breakfast = (12 oz) 360 ml Lunch = (8 oz) 240 ml Dinner (12 oz) 360 ml  . Nutritional Supplements (FEEDING SUPPLEMENT, NEPRO CARB STEADY,) LIQD Take by mouth daily.  Marland Kitchen omeprazole (PRILOSEC) 20 MG capsule Take 20 mg by mouth at bedtime.  . senna-docusate (SENEXON-S) 8.6-50 MG tablet Take 1 tablet by mouth daily.  . tamsulosin (FLOMAX) 0.4 MG CAPS capsule Take 0.4 mg  by mouth daily.  Marland Kitchen UNABLE TO FIND 1.2L fluid restriction - 360 cc 912 oz) at breakfast, 240 cc (8 oz) at Lunch, 240 cc (8 oz) Dinner, 360 cc (12 oz) med pass / snacks om between meals  . warfarin (COUMADIN) 1 MG tablet Take 1 mg by mouth at bedtime. Total dose is 3.5 mg at bedtime  . warfarin (COUMADIN) 2.5 MG tablet Take 2.5 mg by mouth at bedtime.   No facility-administered encounter medications on file as of 01/31/2019.      SIGNIFICANT DIAGNOSTIC EXAMS   PREVIOUS    07-06-18: chest x-ray: Enlargement of cardiac silhouette with vascular congestion and question minimal pulmonary edema.  07-06-18: ct of head;  1. New age indeterminate RIGHT basal ganglia small infarcts. 2. Young RIGHT temporal occipital/PCA territory infarct. 3. Young basal ganglia, thalami, pontine and cerebellar small infarcts. Moderate to severe chronic small vessel ischemic changes. 4. Moderate parenchymal brain volume loss, advanced for age.  07-07-18: US renal: Medical renal disease changes of both kidneys. Small renal cysts in both kidneys. No evidence of hydronephrosis.  07-11-18: ct of head:  1. Stable head CT, with no new acute intracranial abnormality. 2. In band cerebral atrophy with chronic small vessel ischemic disease with multiple chronic ischemic infarcts as above. 3. Acute pan sinusitis, worsened from previous.  05-12-19 ct of abdomen and pelvis: Diverticulosis without diverticulitis. Bilateral renal cysts. Mild bibasilar atelectasis.  07-12-18: 2-d echo:   1. The left ventricle has severely reduced systolic function, with an ejection fraction of 20-25%. The cavity size was mildly dilated. There is mildly increased left ventricular wall thickness. Left ventricular diastolic Doppler parameters are  consistent with impaired relaxation Left ventricular diffuse hypokinesis.  2. The right ventricle has normal systolic function. The cavity was normal. There is no increase in right ventricular wall thickness.   3. Left atrial size was mildly dilated.  4. The mitral  valve is normal in structure. There is mild thickening.  5. The tricuspid valve is normal in structure.  6. AVR.  7. The pulmonic valve was normal in structure.  8. Severe global reduction in LV systolic function; mild LVH; mild LVE; mild LAE; s/p AVR (not well visualized; mean gradient 12 mmHg; AVA 1.5 cm2 but may be underestimated); no vegetations but cannot R/O with this study; suggest TEE if clinically  indicated.  07-20-18: chest x-ray: Right IJ dialysis catheter in good position without complicating features. Cardiac enlargement and vascular congestion without overt pulmonary edema.  11-19-18: BAS: thin liquids  NO NEW EXAMS.    LABS REVIEWED PREVIOUS:   05-22-18: hgb a1c 6.5 chol 100 ldl 43; trig 170; hdl 23 07-04-18: wbc  3.7; hgb 14.0; hct 45.6; mcv 91.8; plt 124; glucose 100; bun 45; creat 2.44; k+ 4.7; na++ 139 BNP 679.0  07-06-18: wbc 1.9; hgb 15.2; hct 48.8; mcv 91.2 plt 107; glucose 136; bun 43; creat 3.01; k+ 5.9; na++ 136; ca 8.8 ast 70 alt 46; albumin 3.0 BNP 554.5   blood culture: no growth /e-coli  07-08-18: wbc 13.8; hgb 13.1; hct 41.1; mcv 93.2 plt 73 glucose 108 ;bun 74; creat 5.32; k+ 5.5; na++ 139; ca 7.5; ast 80 alt 117; albumin 2.3  07-09-18; tsh 3.098 07-17-18: wbc 3.1; hgb 1.8; hct 37.3; mcv 89.9 plt 103 glucose 114; bun 95; creat 6.00; k+ 3.5; na++ 139; ca 6.4 mag 2.1 07-27-18: INR 2.3: coumadin 5 mg daily   07-30-18: wbc  6.8; hgb 10.7; hct 33.6; mcv 89.6; plt 94 glucose 90; bun 93; creat 7.47; k+ 4.2; na++ 135; ca 7.1 08-02-18: INR 1.9   08-15-18: HIV quant: <20 CD4: 360  08-16-18: INR 3.2  08-23-18: INR  1.7 09-04-18: hgb 7.4 hct 23.6  glucose 90; bun 16; create 2.64; k+ 2.8; na++ 138; ca 8.3 phos 3.0; albumin 2.9 iron 52; tibc 218; PTH 124 vit 69.3  09-06-18: INR 2.5 guaiac neg  09-07-18: glucose 99; bun 22; creat 3.24; k+ 3.4;na++ 137; ca 8.4  09-11-18 hgb 10.3; hct 33.9 glucose 83; bun 18; creat 2.48; k+ 3.8; na++  140; ca 8.9 iron 43; tibc 225 PTH 115 vit D 44.6 09-20-18: INR 2.1 09-22-18: guaiac: neg  10-13-18: INR 2.4 10-19-18: INR 2.1 11-02-18: INR 2.2  11-16-18: INR 3.8 11-19-18: INR 2.2 11-26-18: INR 1.6  12-05-18: hgb a1c 6.1 12-10-18: INR 1.9 12-18-18: INR 2.2  01-01-19: INR 2.3  01-10-19: wbc 2.3; hgb 12.1; hct 37.5; mcv 96.9; plt 101; INR 3.0 01-15-19: wbc 2.0; hgb 12.8; hct 39.5; mcv 96.1; plt 100; glucose 126; bun 40; creat 5.45; k+ 3.8; na++ 136; ca 8.8; liver normal albumin 3.2; PSA 0.98; INR 3.9 01-22-19; INR 3.1  01-29-19: INR 2.7  NO NEW LABS.    Review of Systems  Unable to perform ROS: Dementia (unable to participate )    Physical Exam Constitutional:      General: Keith is not in acute distress.    Appearance: Keith is well-developed. Keith is not diaphoretic.  Neck:     Musculoskeletal: Neck supple.     Thyroid: No thyromegaly.  Cardiovascular:     Rate and Rhythm: Normal rate. Rhythm irregular.     Pulses: Normal pulses.     Heart sounds: Murmur present.     Comments: 2/6 ICD present  Pulmonary:     Effort: Pulmonary effort is normal. No respiratory distress.     Breath sounds: Normal breath sounds.  Abdominal:  General: Bowel sounds are normal. There is no distension.     Palpations: Abdomen is soft.     Tenderness: There is no abdominal tenderness.  Musculoskeletal:     Right lower leg: No edema.     Left lower leg: No edema.     Comments: Is able to move all extremities   Lymphadenopathy:     Cervical: No cervical adenopathy.  Skin:    General: Skin is warm and dry.     Comments: Left arm arm a/v fistula: + thrill + bruit           Neurological:     Mental Status: Keith is alert. Mental status is at baseline.  Psychiatric:        Mood and Affect: Mood normal.     ASSESSMENT/ PLAN:  TODAY:   1. Hypertension associated with stage 5 chronic kidney disease due to type 2 diabetes mellitus: is stable b/p 95/72 will continue toprol xl 25 mg daily   2. PAF(paroxsymal atrial  fibrillation) status post bioprosthetic AVR: heart rate is stable will continue toprol xl 25 mg daily for rate control is on chronic coumadin therapy.   3. Dyslipidemia associated with type 2 diabetes mellitus: is stable LDL 43 will continue lipitor 80 mg daily   PREVIOUS   4. Type 2 diabetes mellitus with hypertension and end stage renal disease on dialysis: is stable hgb a1c is 6.1; will continue to monitor his status.   5. Chronic kidney disease with end stage renal disease on dialysis due to type 2 diabetes mellitus/dialysis dependent: is without change is being dialyzed 3 times weekly is followed by nephrology; is on 1200 cc fluid restriction.  For his hypotension will continue midodrine 10 mg with dialysis   6. Other emphysema is stable will continue advair 100/50 mcg 1 puff twice daily and has albuterol neb every 4 hours as needed  7. HIV disease is without change will continue bikatarvy 20-200-25 mg daily is followed by I/D  8. CVA/Multiple CVA is neurologically stable is on chronic coumadin therapy will monitor   9. Anemia due to end stage renal disease is stable hgb 10.3; will monitor  10.  Dysphagia due to Young stroke: is without signs of aspiration; is on thin liquids will monitor  11. Protein calorie malnutrition severe: is without change: albumin 2.9 (prevoius 2.3) weight is 142 pounds will continue supplements as directed  12.  Chronic combined systolic and diastolic heart failure: has ICD is stable EF 20-25% (07-12-18) will continue toprol xl 25 mg daily   13. BPH with obstruction/urinary tract symptoms is stable will continue flomax 0.4 mg daily   14. GERD without esophagitis: will continue prilosec 20 mg daily  15. Chronic constipation is stable will continue colace and senna s daily     MD is aware of resident's narcotic use and is in agreement with current plan of care. We will attempt to wean resident as appropriate.  Ok Edwards NP [facility]   Contact [phone] Monday through Friday 8am- 5pm  After hours call [phone]

## 2019-02-02 ENCOUNTER — Encounter (HOSPITAL_COMMUNITY): Payer: Self-pay

## 2019-02-02 ENCOUNTER — Inpatient Hospital Stay
Admission: RE | Admit: 2019-02-02 | Discharge: 2019-03-02 | Disposition: A | Discharge status: Skilled Nursing Facility | Payer: Medicare HMO | Source: Ambulatory Visit | Attending: Internal Medicine | Admitting: Internal Medicine

## 2019-02-02 ENCOUNTER — Emergency Department (HOSPITAL_COMMUNITY)
Admission: EM | Admit: 2019-02-02 | Discharge: 2019-02-02 | Disposition: A | Discharge status: Home / Self Care | Payer: Medicare HMO | Attending: Emergency Medicine | Admitting: Emergency Medicine

## 2019-02-02 DIAGNOSIS — I5042 Chronic combined systolic (congestive) and diastolic (congestive) heart failure: Secondary | ICD-10-CM | POA: Diagnosis not present

## 2019-02-02 DIAGNOSIS — I480 Paroxysmal atrial fibrillation: Secondary | ICD-10-CM | POA: Insufficient documentation

## 2019-02-02 DIAGNOSIS — N186 End stage renal disease: Secondary | ICD-10-CM | POA: Insufficient documentation

## 2019-02-02 DIAGNOSIS — Z79899 Other long term (current) drug therapy: Secondary | ICD-10-CM | POA: Insufficient documentation

## 2019-02-02 DIAGNOSIS — J449 Chronic obstructive pulmonary disease, unspecified: Secondary | ICD-10-CM | POA: Insufficient documentation

## 2019-02-02 DIAGNOSIS — Z21 Asymptomatic human immunodeficiency virus [HIV] infection status: Secondary | ICD-10-CM | POA: Insufficient documentation

## 2019-02-02 DIAGNOSIS — T82838A Hemorrhage of vascular prosthetic devices, implants and grafts, initial encounter: Secondary | ICD-10-CM

## 2019-02-02 DIAGNOSIS — I132 Hypertensive heart and chronic kidney disease with heart failure and with stage 5 chronic kidney disease, or end stage renal disease: Secondary | ICD-10-CM | POA: Diagnosis not present

## 2019-02-02 DIAGNOSIS — I2510 Atherosclerotic heart disease of native coronary artery without angina pectoris: Secondary | ICD-10-CM | POA: Diagnosis not present

## 2019-02-02 DIAGNOSIS — E1122 Type 2 diabetes mellitus with diabetic chronic kidney disease: Secondary | ICD-10-CM | POA: Insufficient documentation

## 2019-02-02 DIAGNOSIS — Z992 Dependence on renal dialysis: Secondary | ICD-10-CM | POA: Diagnosis not present

## 2019-02-02 DIAGNOSIS — Y828 Other medical devices associated with adverse incidents: Secondary | ICD-10-CM | POA: Diagnosis not present

## 2019-02-02 DIAGNOSIS — T8249XA Other complication of vascular dialysis catheter, initial encounter: Secondary | ICD-10-CM | POA: Diagnosis not present

## 2019-02-02 DIAGNOSIS — Z87891 Personal history of nicotine dependence: Secondary | ICD-10-CM | POA: Insufficient documentation

## 2019-02-02 DIAGNOSIS — Z7901 Long term (current) use of anticoagulants: Secondary | ICD-10-CM | POA: Insufficient documentation

## 2019-02-02 NOTE — ED Notes (Signed)
No bleeding noted from pressure dressing to left fistula

## 2019-02-02 NOTE — ED Provider Notes (Signed)
[facility] EMERGENCY DEPARTMENT Provider Note   CSN: [id number] Arrival date & time: 02/02/19  1425     History   Chief Complaint Chief Complaint  Patient presents with  . Coagulation Disorder    HPI Keith Young is a 68 y.o. male.     HPI   He presents for evaluation of bleeding postdialysis.  1 of his dialysis needles was removed the other left in place.  They were unable to stop bleeding for the first site so they transferred him here.  Patient feels well otherwise.  No recent illnesses.  There are no other known modifying factors.  Past Medical History:  Diagnosis Date  . AICD (automatic cardioverter/defibrillator) present   . Alcoholism in remission ([place])   . Anemia   . Aortic insufficiency   . Arthritis   . At moderate risk for fall   . Bell's palsy   . Chronic systolic heart failure (HCC)    a. EF previously 15-20% with cath in 2014 showing no significant CAD b. EF 20-25% by echo in 06/2018  . CKD (chronic kidney disease), stage IV ([place])   . COPD (chronic obstructive pulmonary disease) ([place])   . Essential hypertension, benign   . HIV disease ([place]) 09/06/2016  . Hyperlipidemia   . NICM (nonischemic cardiomyopathy) ([place])   . Noncompliance with medication regimen   . NSVT (nonsustained ventricular tachycardia) ([place])   . Numbness of right jaw    Had a stoke in 01/2013. Numbness is occasional, especially when trying to chew.  . OSA (obstructive sleep apnea)   . Productive cough 08/2013   With brown sputum   . S/P aortic valve replacement with bioprosthetic valve   . Stroke ([place]) 01/2013   weakness of right side from CVA  . Type 2 diabetes mellitus ([place])   . Type 2 diabetes mellitus with hypertension and end stage renal disease on dialysis ([place]) 07/31/2018  . Uses hearing aid 2014   recently received new hearing aids    Patient Active Problem List   Diagnosis Date Noted  . Chronic anticoagulation 12/11/2018  . Hypertension associated with stage 5 chronic  kidney disease due to type 2 diabetes mellitus ([place]) 12/04/2018  . S/P AVR (aortic valve replacement) 12/04/2018  . Anemia due to end stage renal disease ([place]) 10/02/2018  . ESRD (end stage renal disease) on dialysis ([place]) 08/06/2018  . PAF (paroxysmal atrial fibrillation) ([place]) 07/31/2018  . Dyslipidemia associated with type 2 diabetes mellitus ([place]) 07/31/2018  . Type 2 diabetes mellitus with hypertension and end stage renal disease on dialysis ([place]) 07/31/2018  . Chronic kidney disease with end stage renal disease on dialysis due to type 2 diabetes mellitus ([place]) 07/31/2018  . Dialysis patient ([place]) 07/31/2018  . BPH with obstruction/lower urinary tract symptoms 07/31/2018  . GERD without esophagitis 07/31/2018  . Chronic constipation 07/31/2018  . 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 Malnutrition of moderate degree 07/20/2018  . Diverticulosis   . Tachycardia   . E coli bacteremia   . Septic shock ([place]) 07/07/2018  . Chronic combined systolic and diastolic heart failure ([place]) 12/09/2016  . COPD (chronic obstructive pulmonary disease) ([place]) 09/12/2016  . Protein-calorie malnutrition, severe 09/06/2016  . HIV disease ([place]) 09/06/2016  . CVA (cerebral vascular accident) ([place]) 06/26/2016  . ICD (implantable cardioverter-defibrillator) in place 01/22/2015  . Anemia in chronic kidney disease 03/26/2014  . Carotid bruit 12/18/2013  . Essential hypertension, benign 10/23/2013  . CAD (coronary artery disease) 10/23/2013  Past Surgical History:  Procedure Laterality Date  . AV FISTULA PLACEMENT Left 07/20/2018   Procedure: INSERTION ARTERIOVENOUS GORTEX GRAFT  LEFT ARM;  Surgeon: Rosetta Posner, MD;  Location: [facility];  Service: Vascular;  Laterality: Left;  . BIOPSY N/A 04/10/2014   Procedure: GASTRIC BIOPSY;  Surgeon: Daneil Dolin, MD;  Location: [facility];  Service: Endoscopy;  Laterality: N/A;  . BIOPSY  10/12/2015   Procedure: BIOPSY;  Surgeon: Daneil Dolin, MD;  Location: [facility]  ENDO SUITE;  Service: Endoscopy;;  stomach bx's  . CARDIAC DEFIBRILLATOR PLACEMENT  11/12/12   [employer] Inogen MINI ICD implanted in [place] at [place] AVR per Dr Nelly Laurence note  . COLONOSCOPY WITH PROPOFOL N/A 04/10/2014   RMR: Colonic Diverticulosis  . ESOPHAGOGASTRODUODENOSCOPY (EGD) WITH PROPOFOL N/A 04/10/2014   RMR: Mild erosive reflux esophagitis. Multiple antral polyps likely hyperplastic status post removal by hot snare cautery technique. Diffusely abnormal stomach status post gastric biopsy I suspect some of patients anemaia may be due to intermittent oozing from the stomach. It would be difficult and a risky proposition to attempt complete removal of all of his gastric polyps.  . ESOPHAGOGASTRODUODENOSCOPY (EGD) WITH PROPOFOL N/A 10/12/2015   Procedure: ESOPHAGOGASTRODUODENOSCOPY (EGD) WITH PROPOFOL;  Surgeon: Daneil Dolin, MD;  Location[facility] SU[facility];  Service: Endoscopy;  Laterality: N/A;  4132 - moved to 9:15  . INSERTION OF DIALYSIS CATHETER Right 07/20/2018   Procedure: EXCHANGE OF DIALYSIS CATHETER RIGHT INTERNAL JUGULAR;  Surgeon: Rosetta Posner, MD;  Location: [facility];  Service: Vascular;  Laterality: Right;  . IR FLUORO GUIDE CV LINE RIGHT  07/09/2018  . IR US GUIDE VASC ACCESS RIGHT  07/09/2018  . POLYPECTOMY N/A 04/10/2014   Procedure: GASTRIC POLYPECTOMY;  Surgeon: Daneil Dolin, MD;  Location: [facility];  Service: Endoscopy;  Laterality: N/A;  . RIGHT HEART CATHETERIZATION N/A 02/06/2014   Procedure: RIGHT HEART CATH;  Surgeon: Larey Dresser, MD;  Location: [facility] CATH LAB;  Service: Cardiovascular;  Laterality: N/A;        Home Medications    Prior to Admission medications   Medication Sig Start Date End Date Taking? Authorizing Provider  acetaminophen (TYLENOL) 325 MG tablet Take 2 tablets (650 mg total) by mouth every 4 (four) hours as needed for mild pain (or temp > 37.5 C (99.5 F)). 06/30/16   Sinda Du, MD   albuterol (PROVENTIL) (2.5 MG/3ML) 0.083% nebulizer solution Take 3 mLs (2.5 mg total) by nebulization every 4 (four) hours as needed for wheezing or shortness of breath. 07/26/18   Ghimire, Henreitta Leber, MD  atorvastatin (LIPITOR) 80 MG tablet Take 80 mg by mouth daily.  11/22/16   [provider]  bictegravir-emtricitabine-tenofovir AF (BIKTARVY) 50-200-25 MG TABS tablet Take 1 tablet by mouth daily. 04/18/18   Thayer Headings, MD  calcium carbonate (TUMS EX) 750 MG chewable tablet Chew 1 tablet by mouth 2 (two) times daily. 07/30/18   [provider]  Cholecalciferol (VITAMIN D3) 2000 UNITS TABS Take 2,000 mg by mouth daily.    [provider]  coal tar (NEUTROGENA T-GEL) 0.5 % shampoo Apply to scalp on shower days (Twice a week) for dandruff and dry scalp Wed., and Sat.    [provider]  docusate sodium (COLACE) 100 MG capsule Take 100 mg by mouth daily.    [provider]  Fluticasone-Salmeterol (ADVAIR) 100-50 MCG/DOSE AEPB Inhale 1 puff into the lungs daily.  12/04/18   [provider]  metoprolol succinate (TOPROL-XL) 25 MG 24 hr tablet Take 1 tablet (25 mg total) by mouth daily. 08/08/18   Strader, Fransisco Hertz, PA-C  midodrine (PROAMATINE) 10 MG tablet Take 10 mg by mouth. Send medication with resident to be given at center. Once a day on Tues.,Thurs., Sat.    [provider]  multivitamin (RENA-VIT) TABS tablet Take 1 tablet by mouth at bedtime. 07/26/18   Ghimire, Henreitta Leber, MD  NON FORMULARY Diet Type:  Dysphagia 2 with thin liquids  Fluid restrictions:  1200 cc / 24 hrs Breakfast = (12 oz) 360 ml Lunch = (8 oz) 240 ml Dinner (12 oz) 360 ml 11/19/18   [provider]  Nutritional Supplements (FEEDING SUPPLEMENT, NEPRO CARB STEADY,) LIQD Take by mouth daily. 11/20/18   [provider]  omeprazole (PRILOSEC) 20 MG capsule Take 20 mg by mouth at bedtime. 09/04/18   [provider]  senna-docusate (SENEXON-S)  8.6-50 MG tablet Take 1 tablet by mouth daily.    [provider]  tamsulosin (FLOMAX) 0.4 MG CAPS capsule Take 0.4 mg by mouth daily.    [provider]  UNABLE TO FIND 1.2L fluid restriction - 360 cc 912 oz) at breakfast, 240 cc (8 oz) at Lunch, 240 cc (8 oz) Dinner, 360 cc (12 oz) med pass / snacks om between meals 09/06/18   [provider]  warfarin (COUMADIN) 1 MG tablet Take 1 mg by mouth at bedtime. Total dose is 3.5 mg at bedtime    [provider]  warfarin (COUMADIN) 2.5 MG tablet Take 2.5 mg by mouth at bedtime.    [provider]    Family History Family History  Problem Relation Age of Onset  . Kidney disease Mother   . Kidney disease Sister   . Colon cancer Neg Hx     Social History Social History   Tobacco Use  . Smoking status: Former Smoker    Types: Cigarettes    Quit date: 11/05/1993    Years since quitting: 25.2  . Smokeless tobacco: Never Used  Substance Use Topics  . Alcohol use: Not Currently    Comment: former heavy ETOH, none recently  . Drug use: No    Comment: prior history of cocaine use, smoking      Allergies   Patient has no known allergies.   Review of Systems Review of Systems  All other systems reviewed and are negative.    Physical Exam Updated Vital Signs BP (!) 116/100 (BP Location: Right Arm)   Pulse 90   Temp (!) 97 F (36.1 C) (Oral)   Resp 20   SpO2 100%   Physical Exam Vitals signs and nursing note reviewed.  Constitutional:      Appearance: He is well-developed.  HENT:     Head: Normocephalic and atraumatic.     Right Ear: External ear normal.     Left Ear: External ear normal.  Eyes:     Conjunctiva/sclera: Conjunctivae normal.     Pupils: Pupils are equal, round, and reactive to light.  Neck:     Musculoskeletal: Normal range of motion and neck supple.     Trachea: Phonation normal.  Cardiovascular:     Rate and Rhythm: Normal rate.     Comments: Vascular fistula,  left upper arm, with normal pulsation.  Pressure being held by nurse so the wound was not visualized. Pulmonary:     Effort: Pulmonary effort is normal.  Abdominal:     Tenderness: There is no abdominal tenderness.  Musculoskeletal: Normal range of motion.  Skin:    General: Skin is warm and dry.  Neurological:     Mental Status: He is alert and oriented to person, place, and time.     Cranial Nerves: No cranial nerve deficit.     Sensory: No sensory deficit.     Motor: No abnormal muscle tone.     Coordination: Coordination normal.  Psychiatric:        Behavior: Behavior normal.        Thought Content: Thought content normal.        Judgment: Judgment normal.      ED Treatments / Results  Labs (all labs ordered are listed, but only abnormal results are displayed) Labs Reviewed - No data to display  EKG None  Radiology No results found.  Procedures Procedures (including critical care time)  Medications Ordered in ED Medications - No data to display   Initial Impression / Assessment and Plan / ED Course  I have reviewed the triage vital signs and the nursing notes.  Pertinent labs & imaging results that were available during my care of the patient were reviewed by me and considered in my medical decision making (see chart for details).         Patient Vitals for the past 24 hrs:  BP Temp Temp src Pulse Resp SpO2  02/02/19 1432 (!) 116/100 (!) 97 F (36.1 C) Oral 90 20 100 %    3:32 PM Reevaluation with update and discussion. After initial assessment and treatment, an updated evaluation reveals bleeding controlled, bandage dry.  Findings discussed with patient. Daleen Bo   Medical Decision Making: Bleeding dialysis fistula, controlled with pressure.  Doubt significant blood loss, metabolic instability or impending vascular collapse.  CRITICAL CARE-no  Nursing Notes Reviewed/ Care Coordinated Applicable Imaging Reviewed Interpretation of Laboratory  Data incorporated into ED treatment  The patient appears reasonably screened and/or stabilized for discharge and I doubt any other medical condition or other [facility] requiring further screening, evaluation, or treatment in the ED at this time prior to discharge.  Plan: Home Medications-continue usual; Home Treatments-keep bandage on and remove as needed; return here if the recommended treatment, does not improve the symptoms; Recommended follow up-PCP or nephrology,.  Performed by: Daleen Bo    Final Clinical Impressions(s) / ED Diagnoses   Final diagnoses:  Bleeding from dialysis shunt, initial encounter [facility])    ED Discharge Orders    None       Daleen Bo, MD 02/02/19 [id number]

## 2019-02-02 NOTE — ED Notes (Signed)
Charge RN Sofie Rower spoke with Levada Dy (dialysis RN) regarding de accessing dialysis access. Levada Dy states she was over 1 hr away and it would be appropriate for ED RN to remove. She instructed to remove needle and hold direct pressure for at least 7 minutes. Site removed by myself and A. Crews Therapist, sports. Pressure being held at this time.

## 2019-02-02 NOTE — Discharge Instructions (Addendum)
You were evaluated in the Emergency Department and after careful evaluation, we did not find any emergent condition requiring admission or further testing in the hospital. ° °Please return to the Emergency Department if you experience any worsening of your condition.  We encourage you to follow up with a primary care provider.  Thank you for allowing us to be a part of your care. °

## 2019-02-02 NOTE — ED Triage Notes (Signed)
Pt was brought in due dialysis fistula bleeding. Pt was brought from [place]. Pt is no longer bleeding . Nurse reports that he bleed for 25 min and clamp was applied . Fistula still accessed [facility] notified

## 2019-02-06 ENCOUNTER — Encounter: Payer: Self-pay | Admitting: Adult Health

## 2019-02-06 ENCOUNTER — Non-Acute Institutional Stay (SKILLED_NURSING_FACILITY): Payer: Medicare HMO | Admitting: Adult Health

## 2019-02-06 DIAGNOSIS — Z992 Dependence on renal dialysis: Secondary | ICD-10-CM | POA: Diagnosis not present

## 2019-02-06 DIAGNOSIS — N186 End stage renal disease: Secondary | ICD-10-CM

## 2019-02-06 DIAGNOSIS — F0150 Vascular dementia without behavioral disturbance: Secondary | ICD-10-CM

## 2019-02-06 DIAGNOSIS — E1122 Type 2 diabetes mellitus with diabetic chronic kidney disease: Secondary | ICD-10-CM | POA: Diagnosis not present

## 2019-02-06 NOTE — Progress Notes (Signed)
Location:    [facility] Room Number: 111/W Place of Service:  SNF (31)   CODE STATUS: DNR  No Known Allergies  Chief Complaint  Patient presents with  . Acute Visit    Care Plan Meeting    HPI:  We have come together for his care plan meeting: BIMS 03/15 mood 0/30. He continues to have dialysis three days weekly. He has had one fall without injury. His weight has been steady for the past 2 months. There are no reports of changes in his appetite; no uncontrolled pain.    Past Medical History:  Diagnosis Date  . AICD (automatic cardioverter/defibrillator) present   . Alcoholism in remission ([place])   . Anemia   . Aortic insufficiency   . Arthritis   . At moderate risk for fall   . Bell's palsy   . Chronic systolic heart failure (HCC)    a. EF previously 15-20% with cath in 2014 showing no significant CAD b. EF 20-25% by echo in 06/2018  . CKD (chronic kidney disease), stage IV ([place])   . COPD (chronic obstructive pulmonary disease) ([place])   . Essential hypertension, benign   . HIV disease ([place]) 09/06/2016  . Hyperlipidemia   . NICM (nonischemic cardiomyopathy) ([place])   . Noncompliance with medication regimen   . NSVT (nonsustained ventricular tachycardia) ([place])   . Numbness of right jaw    Had a stoke in 01/2013. Numbness is occasional, especially when trying to chew.  . OSA (obstructive sleep apnea)   . Productive cough 08/2013   With brown sputum   . S/P aortic valve replacement with bioprosthetic valve   . Stroke ([place]) 01/2013   weakness of right side from CVA  . Type 2 diabetes mellitus ([place])   . 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 Uses hearing aid 2014   recently received new hearing aids    Past Surgical History:  Procedure Laterality Date  . AV FISTULA PLACEMENT Left 07/20/2018   Procedure: INSERTION ARTERIOVENOUS GORTEX GRAFT  LEFT ARM;  Surgeon: Rosetta Posner, MD;  Location:  [facility];  Service: Vascular;  Laterality: Left;  . BIOPSY N/A 04/10/2014   Procedure: GASTRIC BIOPSY;  Surgeon: Daneil Dolin, MD;  Location: [facility];  Service: Endoscopy;  Laterality: N/A;  . BIOPSY  10/12/2015   Procedure: BIOPSY;  Surgeon: Daneil Dolin, MD;  Location: [facility];  Service: Endoscopy;;  stomach bx's  . CARDIAC DEFIBRILLATOR PLACEMENT  11/12/12   [employer] Inogen MINI ICD implanted in [place] at [place] AVR per Dr Nelly Laurence note  . COLONOSCOPY WITH PROPOFOL N/A 04/10/2014   RMR: Colonic Diverticulosis  . ESOPHAGOGASTRODUODENOSCOPY (EGD) WITH PROPOFOL N/A 04/10/2014   RMR: Mild erosive reflux esophagitis. Multiple antral polyps likely hyperplastic status post removal by hot snare cautery technique. Diffusely abnormal stomach status post gastric biopsy I suspect some of patients anemaia may be due to intermittent oozing from the stomach. It would be difficult and a risky proposition to attempt complete removal of all of his gastric polyps.  . ESOPHAGOGASTRODUODENOSCOPY (EGD) WITH PROPOFOL N/A 10/12/2015   Procedure: ESOPHAGOGASTRODUODENOSCOPY (EGD) WITH PROPOFOL;  Surgeon: Daneil Dolin, MD;  [facility];  Service: Endoscopy;  Laterality: N/A;  8295 - moved to 9:15  . INSERTION OF DIALYSIS CATHETER Right 07/20/2018   Procedure: EXCHANGE OF DIALYSIS CATHETER RIGHT INTERNAL  JUGULAR;  Surgeon: Rosetta Posner, MD;  Location: [facility] OR;  Service: Vascular;  Laterality: Right;  . IR FLUORO GUIDE CV LINE RIGHT  07/09/2018  . IR US GUIDE VASC ACCESS RIGHT  07/09/2018  . POLYPECTOMY N/A 04/10/2014   Procedure: GASTRIC POLYPECTOMY;  Surgeon: Daneil Dolin, MD;  Location: [facility];  Service: Endoscopy;  Laterality: N/A;  . RIGHT HEART CATHETERIZATION N/A 02/06/2014   Procedure: RIGHT HEART CATH;  Surgeon: Larey Dresser, MD;  Location: [facility] CATH LAB;  Service: Cardiovascular;  Laterality: N/A;    Social History    Socioeconomic History  . Marital status: Single    Spouse name: Not on file  . Number of children: Not on file  . Years of education: Not on file  . Highest education level: Not on file  Occupational History  . Occupation: disabled  Social Needs  . Financial resource strain: Not hard at all  . Food insecurity    Worry: Never true    Inability: Never true  . Transportation needs    Medical: No    Non-medical: No  Tobacco Use  . Smoking status: Former Smoker    Types: Cigarettes    Quit date: 11/05/1993    Years since quitting: 25.2  . Smokeless tobacco: Never Used  Substance and Sexual Activity  . Alcohol use: Not Currently    Comment: former heavy ETOH, none recently  . Drug use: No    Comment: prior history of cocaine use, smoking   . Sexual activity: Yes    Birth control/protection: None  Lifestyle  . Physical activity    Days per week: 0 days    Minutes per session: 0 min  . Stress: Not at all  Relationships  . Social Herbalist on phone: Never    Gets together: Once a week    Attends religious service: Never    Active member of club or organization: No    Attends meetings of clubs or organizations: Never    Relationship status: Never married  . Intimate partner violence    Fear of current or ex partner: No    Emotionally abused: No    Physically abused: No    Forced sexual activity: No  Other Topics Concern  . Not on file  Social History Narrative    Has h/o polysubstance abuse.  Baseline    2019- resident of [facility]. Unable to ambulate unassisted.            Family History  Problem Relation Age of Onset  . Kidney disease Mother   . Kidney disease Sister   . Colon cancer Neg Hx       VITAL SIGNS BP (!) 112/57   Pulse 80   Temp 98.3 F (36.8 C) (Oral)   Resp 20   Ht 5\' 3"  (1.6 m)   Wt 143 lb 12.8 oz (65.2 kg)   SpO2 94%   BMI 25.47 kg/m   Outpatient Encounter Medications as of 02/06/2019  Medication Sig  .  acetaminophen (TYLENOL) 325 MG tablet Take 2 tablets (650 mg total) by mouth every 4 (four) hours as needed for mild pain (or temp > 37.5 C (99.5 F)).  Marland Kitchen albuterol (PROVENTIL) (2.5 MG/3ML) 0.083% nebulizer solution Take 3 mLs (2.5 mg total) by nebulization every 4 (four) hours as needed for wheezing or shortness of breath.  Marland Kitchen atorvastatin (LIPITOR) 80 MG tablet Take 80 mg by mouth daily.   . bictegravir-emtricitabine-tenofovir AF (  BIKTARVY) 50-200-25 MG TABS tablet Take 1 tablet by mouth daily.  . calcium carbonate (TUMS EX) 750 MG chewable tablet Chew 1 tablet by mouth 2 (two) times daily.  . Cholecalciferol (VITAMIN D3) 2000 UNITS TABS Take 2,000 mg by mouth daily.  . coal tar (NEUTROGENA T-GEL) 0.5 % shampoo Apply to scalp on shower days (Twice a week) for dandruff and dry scalp Wed., and Sat.  . docusate sodium (COLACE) 100 MG capsule Take 100 mg by mouth daily.  . Fluticasone-Salmeterol (ADVAIR) 100-50 MCG/DOSE AEPB Inhale 1 puff into the lungs daily.  . metoprolol succinate (TOPROL-XL) 25 MG 24 hr tablet Take 1 tablet (25 mg total) by mouth daily.  . midodrine (PROAMATINE) 10 MG tablet Take 10 mg by mouth. Send medication with resident to be given at center. Once a day on Tues.,Thurs., Sat.  . multivitamin (RENA-VIT) TABS tablet Take 1 tablet by mouth at bedtime.  . NON FORMULARY Diet Type:  Dysphagia 2 with thin liquids  Fluid restrictions:  1200 cc / 24 hrs Breakfast = (12 oz) 360 ml Lunch = (8 oz) 240 ml Dinner (12 oz) 360 ml  . Nutritional Supplements (FEEDING SUPPLEMENT, NEPRO CARB STEADY,) LIQD Take by mouth daily.  Marland Kitchen omeprazole (PRILOSEC) 20 MG capsule Take 20 mg by mouth at bedtime.  . senna-docusate (SENEXON-S) 8.6-50 MG tablet Take 1 tablet by mouth daily.  . tamsulosin (FLOMAX) 0.4 MG CAPS capsule Take 0.4 mg by mouth daily.  Marland Kitchen UNABLE TO FIND 1.2L fluid restriction - 360 cc 912 oz) at breakfast, 240 cc (8 oz) at Lunch, 240 cc (8 oz) Dinner, 360 cc (12 oz) med pass / snacks om  between meals  . warfarin (COUMADIN) 1 MG tablet Take 1 mg by mouth at bedtime. Total dose is 3.5 mg at bedtime  . warfarin (COUMADIN) 2.5 MG tablet Take 2.5 mg by mouth at bedtime.  Marland Kitchen ZINC OXIDE EX Apply Zinc oxide to bilateral buttocks area for irritation. Every Shift Day, Day, Day   No facility-administered encounter medications on file as of 02/06/2019.      SIGNIFICANT DIAGNOSTIC EXAMS   PREVIOUS    07-06-18: chest x-ray: Enlargement of cardiac silhouette with vascular congestion and question minimal pulmonary edema.  07-06-18: ct of head;  1. New age indeterminate RIGHT basal ganglia small infarcts. 2. Old RIGHT temporal occipital/PCA territory infarct. 3. Old basal ganglia, thalami, pontine and cerebellar small infarcts. Moderate to severe chronic small vessel ischemic changes. 4. Moderate parenchymal brain volume loss, advanced for age.  07-07-18: US renal: Medical renal disease changes of both kidneys. Small renal cysts in both kidneys. No evidence of hydronephrosis.  07-11-18: ct of head:  1. Stable head CT, with no new acute intracranial abnormality. 2. In band cerebral atrophy with chronic small vessel ischemic disease with multiple chronic ischemic infarcts as above. 3. Acute pan sinusitis, worsened from previous.  05-12-19 ct of abdomen and pelvis: Diverticulosis without diverticulitis. Bilateral renal cysts. Mild bibasilar atelectasis.  07-12-18: 2-d echo:   1. The left ventricle has severely reduced systolic function, with an ejection fraction of 20-25%. The cavity size was mildly dilated. There is mildly increased left ventricular wall thickness. Left ventricular diastolic Doppler parameters are  consistent with impaired relaxation Left ventricular diffuse hypokinesis.  2. The right ventricle has normal systolic function. The cavity was normal. There is no increase in right ventricular wall thickness.  3. Left atrial size was mildly dilated.  4. The mitral valve is  normal in structure. There  is mild thickening.  5. The tricuspid valve is normal in structure.  6. AVR.  7. The pulmonic valve was normal in structure.  8. Severe global reduction in LV systolic function; mild LVH; mild LVE; mild LAE; s/p AVR (not well visualized; mean gradient 12 mmHg; AVA 1.5 cm2 but may be underestimated); no vegetations but cannot R/O with this study; suggest TEE if clinically  indicated.  07-20-18: chest x-ray: Right IJ dialysis catheter in good position without complicating features. Cardiac enlargement and vascular congestion without overt pulmonary edema.  11-19-18: BAS: thin liquids  NO NEW EXAMS.    LABS REVIEWED PREVIOUS:   05-22-18: hgb a1c 6.5 chol 100 ldl 43; trig 170; hdl 23 07-04-18: wbc  3.7; hgb 14.0; hct 45.6; mcv 91.8; plt 124; glucose 100; bun 45; creat 2.44; k+ 4.7; na++ 139 BNP 679.0  07-06-18: wbc 1.9; hgb 15.2; hct 48.8; mcv 91.2 plt 107; glucose 136; bun 43; creat 3.01; k+ 5.9; na++ 136; ca 8.8 ast 70 alt 46; albumin 3.0 BNP 554.5   blood culture: no growth /e-coli  07-08-18: wbc 13.8; hgb 13.1; hct 41.1; mcv 93.2 plt 73 glucose 108 ;bun 74; creat 5.32; k+ 5.5; na++ 139; ca 7.5; ast 80 alt 117; albumin 2.3  07-09-18; tsh 3.098 07-17-18: wbc 3.1; hgb 1.8; hct 37.3; mcv 89.9 plt 103 glucose 114; bun 95; creat 6.00; k+ 3.5; na++ 139; ca 6.4 mag 2.1 07-27-18: INR 2.3: coumadin 5 mg daily   07-30-18: wbc  6.8; hgb 10.7; hct 33.6; mcv 89.6; plt 94 glucose 90; bun 93; creat 7.47; k+ 4.2; na++ 135; ca 7.1 08-02-18: INR 1.9   08-15-18: HIV quant: <20 CD4: 360  08-16-18: INR 3.2  08-23-18: INR  1.7 09-04-18: hgb 7.4 hct 23.6  glucose 90; bun 16; create 2.64; k+ 2.8; na++ 138; ca 8.3 phos 3.0; albumin 2.9 iron 52; tibc 218; PTH 124 vit 69.3  09-06-18: INR 2.5 guaiac neg  09-07-18: glucose 99; bun 22; creat 3.24; k+ 3.4;na++ 137; ca 8.4  09-11-18 hgb 10.3; hct 33.9 glucose 83; bun 18; creat 2.48; k+ 3.8; na++ 140; ca 8.9 iron 43; tibc 225 PTH 115 vit D 44.6 09-20-18: INR 2.1  09-22-18: guaiac: neg  10-13-18: INR 2.4 10-19-18: INR 2.1 11-02-18: INR 2.2  11-16-18: INR 3.8 11-19-18: INR 2.2 11-26-18: INR 1.6  12-05-18: hgb a1c 6.1 12-10-18: INR 1.9 12-18-18: INR 2.2  01-01-19: INR 2.3  01-10-19: wbc 2.3; hgb 12.1; hct 37.5; mcv 96.9; plt 101; INR 3.0 01-15-19: wbc 2.0; hgb 12.8; hct 39.5; mcv 96.1; plt 100; glucose 126; bun 40; creat 5.45; k+ 3.8; na++ 136; ca 8.8; liver normal albumin 3.2; PSA 0.98; INR 3.9 01-22-19; INR 3.1  01-29-19: INR 2.7  NO NEW LABS.      Review of Systems  Unable to perform ROS: Dementia (unable to participate )    Physical Exam Constitutional:      General: He is not in acute distress.    Appearance: He is well-developed. He is not diaphoretic.  Neck:     Musculoskeletal: Neck supple.     Thyroid: No thyromegaly.  Cardiovascular:     Rate and Rhythm: Normal rate. Rhythm irregular.     Pulses: Normal pulses.     Heart sounds: Murmur present.     Comments: 2/6 ICD present  Pulmonary:     Effort: Pulmonary effort is normal. No respiratory distress.     Breath sounds: Normal breath sounds.  Abdominal:     General: Bowel  sounds are normal. There is no distension.     Palpations: Abdomen is soft.     Tenderness: There is no abdominal tenderness.  Musculoskeletal:     Right lower leg: No edema.     Left lower leg: No edema.     Comments: Is able to move all extremities   Lymphadenopathy:     Cervical: No cervical adenopathy.  Skin:    General: Skin is warm and dry.     Comments:  Left arm arm a/v fistula: + thrill + bruit            Neurological:     Mental Status: He is alert. Mental status is at baseline.  Psychiatric:        Mood and Affect: Mood normal.      ASSESSMENT/ PLAN:  TODAY:   1. Chronic kidney disease with end stage renal disease on dialysis due to type 2 diabetes mellitus 2. Vascular dementia without behavioral disturbance 3. Dialysis patient  Will continue current medications;  Will continue current plan  of care Will continue to monitor his status.      MD is aware of resident's narcotic use and is in agreement with current plan of care. We will attempt to wean resident as appropriate.  Ok Edwards NP [facility]  Contact [phone] Monday through Friday 8am- 5pm  After hours call [phone]

## 2019-02-08 ENCOUNTER — Ambulatory Visit (INDEPENDENT_AMBULATORY_CARE_PROVIDER_SITE_OTHER): Payer: Medicare HMO | Admitting: *Deleted

## 2019-02-08 DIAGNOSIS — I480 Paroxysmal atrial fibrillation: Secondary | ICD-10-CM | POA: Diagnosis not present

## 2019-02-08 DIAGNOSIS — I5042 Chronic combined systolic (congestive) and diastolic (congestive) heart failure: Secondary | ICD-10-CM

## 2019-02-08 LAB — CUP PACEART REMOTE DEVICE CHECK
Battery Remaining Longevity: 24 mo
Battery Remaining Percentage: 46 %
Brady Statistic RV Percent Paced: 0 %
Date Time Interrogation Session: 20200911034900
HighPow Impedance: 37 Ohm
Implantable Lead Implant Date: 20140617
Implantable Lead Location: 753860
Implantable Lead Model: 180
Implantable Lead Serial Number: 310936
Implantable Pulse Generator Implant Date: 20140617
Lead Channel Impedance Value: 400 Ohm
Lead Channel Pacing Threshold Amplitude: 1.3 V
Lead Channel Pacing Threshold Pulse Width: 0.4 ms
Lead Channel Setting Pacing Amplitude: 2.5 V
Lead Channel Setting Pacing Pulse Width: 0.4 ms
Lead Channel Setting Sensing Sensitivity: 0.6 mV
Pulse Gen Serial Number: 436636

## 2019-02-09 DIAGNOSIS — F0150 Vascular dementia without behavioral disturbance: Secondary | ICD-10-CM | POA: Insufficient documentation

## 2019-02-12 ENCOUNTER — Non-Acute Institutional Stay (SKILLED_NURSING_FACILITY): Payer: Medicare HMO | Admitting: Adult Health

## 2019-02-12 ENCOUNTER — Encounter (HOSPITAL_COMMUNITY)
Admission: RE | Admit: 2019-02-12 | Discharge: 2019-02-12 | Disposition: A | Discharge status: Home / Self Care | Payer: Medicare HMO | Source: Skilled Nursing Facility | Attending: Adult Health | Admitting: Adult Health

## 2019-02-12 ENCOUNTER — Encounter: Payer: Self-pay | Admitting: Adult Health

## 2019-02-12 DIAGNOSIS — I639 Cerebral infarction, unspecified: Secondary | ICD-10-CM | POA: Diagnosis not present

## 2019-02-12 DIAGNOSIS — Z952 Presence of prosthetic heart valve: Secondary | ICD-10-CM | POA: Diagnosis not present

## 2019-02-12 DIAGNOSIS — N186 End stage renal disease: Secondary | ICD-10-CM | POA: Diagnosis present

## 2019-02-12 DIAGNOSIS — I480 Paroxysmal atrial fibrillation: Secondary | ICD-10-CM | POA: Diagnosis not present

## 2019-02-12 DIAGNOSIS — I132 Hypertensive heart and chronic kidney disease with heart failure and with stage 5 chronic kidney disease, or end stage renal disease: Secondary | ICD-10-CM | POA: Insufficient documentation

## 2019-02-12 DIAGNOSIS — Z7901 Long term (current) use of anticoagulants: Secondary | ICD-10-CM | POA: Diagnosis not present

## 2019-02-12 LAB — PROTIME-INR
INR: 4.6 (ref 0.8–1.2)
Prothrombin Time: 42.6 seconds — ABNORMAL HIGH (ref 11.4–15.2)

## 2019-02-12 NOTE — Progress Notes (Signed)
Location:    [facility] Room Number: 111/W Place of Service:  SNF (31)   CODE STATUS: DNR  No Known Allergies  Chief Complaint  Patient presents with  . Anticoagulation    INR    HPI:  He is on long term coumadin therapy for afib; cva; valve replacement. His INR is goal is 2.5-3.0.  His INR today is 4.6 and he is taking coumadin 3.5 mg daily. There are no reports of missed coumadin doses. He has in the past couple of weeks been to the ED for abnormal bleeding from his dialysis shunt. There are no reports of changes in appetite. No reports of pain.   Past Medical History:  Diagnosis Date  . AICD (automatic cardioverter/defibrillator) present   . Alcoholism in remission ([place])   . Anemia   . Aortic insufficiency   . Arthritis   . At moderate risk for fall   . Bell's palsy   . Chronic systolic heart failure (HCC)    a. EF previously 15-20% with cath in 2014 showing no significant CAD b. EF 20-25% by echo in 06/2018  . CKD (chronic kidney disease), stage IV ([place])   . COPD (chronic obstructive pulmonary disease) ([place])   . Essential hypertension, benign   . HIV disease ([place]) 09/06/2016  . Hyperlipidemia   . NICM (nonischemic cardiomyopathy) ([place])   . Noncompliance with medication regimen   . NSVT (nonsustained ventricular tachycardia) ([place])   . Numbness of right jaw    Had a stoke in 01/2013. Numbness is occasional, especially when trying to chew.  . OSA (obstructive sleep apnea)   . Productive cough 08/2013   With brown sputum   . S/P aortic valve replacement with bioprosthetic valve   . Stroke ([place]) 01/2013   weakness of right side from CVA  . Type 2 diabetes mellitus ([place])   . 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 Uses hearing aid 2014   recently received new hearing aids    Past Surgical History:  Procedure Laterality Date  . AV FISTULA PLACEMENT Left 07/20/2018   Procedure:  INSERTION ARTERIOVENOUS GORTEX GRAFT  LEFT ARM;  Surgeon: Rosetta Posner, MD;  Location: [facility];  Service: Vascular;  Laterality: Left;  . BIOPSY N/A 04/10/2014   Procedure: GASTRIC BIOPSY;  Surgeon: Daneil Dolin, MD;  Location: [facility];  Service: Endoscopy;  Laterality: N/A;  . BIOPSY  10/12/2015   Procedure: BIOPSY;  Surgeon: Daneil Dolin, MD;  [facility];  Service: Endoscopy;;  stomach bx's  . CARDIAC DEFIBRILLATOR PLACEMENT  11/12/12   [employer] Inogen MINI ICD implanted in [place] at [facility] per Dr Nelly Laurence note  . COLONOSCOPY WITH PROPOFOL N/A 04/10/2014   RMR: Colonic Diverticulosis  . ESOPHAGOGASTRODUODENOSCOPY (EGD) WITH PROPOFOL N/A 04/10/2014   RMR: Mild erosive reflux esophagitis. Multiple antral polyps likely hyperplastic status post removal by hot snare cautery technique. Diffusely abnormal stomach status post gastric biopsy I suspect some of patients anemaia may be due to intermittent oozing from the stomach. It would be difficult and a risky proposition to attempt complete removal of all of his gastric polyps.  . ESOPHAGOGASTRODUODENOSCOPY (EGD) WITH PROPOFOL N/A 10/12/2015   Procedure: ESOPHAGOGASTRODUODENOSCOPY (EGD) WITH PROPOFOL;  Surgeon: Daneil Dolin, MD;  [facility];  Service: Endoscopy;  Laterality: N/A;  6433 - moved to 9:15  .  INSERTION OF DIALYSIS CATHETER Right 07/20/2018   Procedure: EXCHANGE OF DIALYSIS CATHETER RIGHT INTERNAL JUGULAR;  Surgeon: Rosetta Posner, MD;  Location: [facility];  Service: Vascular;  Laterality: Right;  . IR FLUORO GUIDE CV LINE RIGHT  07/09/2018  . IR US GUIDE VASC ACCESS RIGHT  07/09/2018  . POLYPECTOMY N/A 04/10/2014   Procedure: GASTRIC POLYPECTOMY;  Surgeon: Daneil Dolin, MD;  Location: [facility];  Service: Endoscopy;  Laterality: N/A;  . RIGHT HEART CATHETERIZATION N/A 02/06/2014   Procedure: RIGHT HEART CATH;  Surgeon: Larey Dresser, MD;  Location: [facility]  CATH LAB;  Service: Cardiovascular;  Laterality: N/A;    Social History   Socioeconomic History  . Marital status: Single    Spouse name: Not on file  . Number of children: Not on file  . Years of education: Not on file  . Highest education level: Not on file  Occupational History  . Occupation: disabled  Social Needs  . Financial resource strain: Not hard at all  . Food insecurity    Worry: Never true    Inability: Never true  . Transportation needs    Medical: No    Non-medical: No  Tobacco Use  . Smoking status: Former Smoker    Types: Cigarettes    Quit date: 11/05/1993    Years since quitting: 25.2  . Smokeless tobacco: Never Used  Substance and Sexual Activity  . Alcohol use: Not Currently    Comment: former heavy ETOH, none recently  . Drug use: No    Comment: prior history of cocaine use, smoking   . Sexual activity: Yes    Birth control/protection: None  Lifestyle  . Physical activity    Days per week: 0 days    Minutes per session: 0 min  . Stress: Not at all  Relationships  . Social Herbalist on phone: Never    Gets together: Once a week    Attends religious service: Never    Active member of club or organization: No    Attends meetings of clubs or organizations: Never    Relationship status: Never married  . Intimate partner violence    Fear of current or ex partner: No    Emotionally abused: No    Physically abused: No    Forced sexual activity: No  Other Topics Concern  . Not on file  Social History Narrative    Has h/o polysubstance abuse.  Baseline    2019- resident of [facility]. Unable to ambulate unassisted.            Family History  Problem Relation Age of Onset  . Kidney disease Mother   . Kidney disease Sister   . Colon cancer Neg Hx       VITAL SIGNS BP 95/72   Pulse 80   Temp 98 F (36.7 C) (Oral)   Resp 20   Ht 5\' 3"  (1.6 m)   Wt 143 lb 12.8 oz (65.2 kg)   SpO2 94%   BMI 25.47 kg/m    Outpatient Encounter Medications as of 02/12/2019  Medication Sig  . acetaminophen (TYLENOL) 325 MG tablet Take 2 tablets (650 mg total) by mouth every 4 (four) hours as needed for mild pain (or temp > 37.5 C (99.5 F)).  Marland Kitchen albuterol (PROVENTIL) (2.5 MG/3ML) 0.083% nebulizer solution Take 3 mLs (2.5 mg total) by nebulization every 4 (four) hours as needed for wheezing or shortness of breath.  Marland Kitchen atorvastatin (LIPITOR)  80 MG tablet Take 80 mg by mouth daily.   . bictegravir-emtricitabine-tenofovir AF (BIKTARVY) 50-200-25 MG TABS tablet Take 1 tablet by mouth daily.  . calcium carbonate (TUMS EX) 750 MG chewable tablet Chew 1 tablet by mouth 2 (two) times daily.  . Cholecalciferol (VITAMIN D3) 2000 UNITS TABS Take 2,000 mg by mouth daily.  . coal tar (NEUTROGENA T-GEL) 0.5 % shampoo Apply to scalp on shower days (Twice a week) for dandruff and dry scalp Wed., and Sat.  . docusate sodium (COLACE) 100 MG capsule Take 100 mg by mouth daily.  . Fluticasone-Salmeterol (ADVAIR) 100-50 MCG/DOSE AEPB Inhale 1 puff into the lungs daily.  . metoprolol succinate (TOPROL-XL) 25 MG 24 hr tablet Take 1 tablet (25 mg total) by mouth daily.  . midodrine (PROAMATINE) 10 MG tablet Take 10 mg by mouth. Send medication with resident to be given at center. Once a day on Tues.,Thurs., Sat.  . multivitamin (RENA-VIT) TABS tablet Take 1 tablet by mouth at bedtime.  . NON FORMULARY Diet Type:  Dysphagia 2 with thin liquids  Fluid restrictions:  1200 cc / 24 hrs Breakfast = (12 oz) 360 ml Lunch = (8 oz) 240 ml Dinner (12 oz) 360 ml  . Nutritional Supplements (FEEDING SUPPLEMENT, NEPRO CARB STEADY,) LIQD Take by mouth daily.  Marland Kitchen omeprazole (PRILOSEC) 20 MG capsule Take 20 mg by mouth at bedtime.  . senna-docusate (SENEXON-S) 8.6-50 MG tablet Take 1 tablet by mouth daily.  . tamsulosin (FLOMAX) 0.4 MG CAPS capsule Take 0.4 mg by mouth daily.  Marland Kitchen UNABLE TO FIND 1.2L fluid restriction - 360 cc 912 oz) at breakfast, 240 cc (8  oz) at Lunch, 240 cc (8 oz) Dinner, 360 cc (12 oz) med pass / snacks om between meals  . ZINC OXIDE EX Apply Zinc oxide to bilateral buttocks area for irritation. Every Shift Day, Day, Day  . [DISCONTINUED] warfarin (COUMADIN) 1 MG tablet Take 1 mg by mouth at bedtime. Total dose is 3.5 mg at bedtime  . [DISCONTINUED] warfarin (COUMADIN) 2.5 MG tablet Take 2.5 mg by mouth at bedtime.   No facility-administered encounter medications on file as of 02/12/2019.      SIGNIFICANT DIAGNOSTIC EXAMS   PREVIOUS    07-06-18: chest x-ray: Enlargement of cardiac silhouette with vascular congestion and question minimal pulmonary edema.  07-06-18: ct of head;  1. New age indeterminate RIGHT basal ganglia small infarcts. 2. Old RIGHT temporal occipital/PCA territory infarct. 3. Old basal ganglia, thalami, pontine and cerebellar small infarcts. Moderate to severe chronic small vessel ischemic changes. 4. Moderate parenchymal brain volume loss, advanced for age.  07-07-18: US renal: Medical renal disease changes of both kidneys. Small renal cysts in both kidneys. No evidence of hydronephrosis.  07-11-18: ct of head:  1. Stable head CT, with no new acute intracranial abnormality. 2. In band cerebral atrophy with chronic small vessel ischemic disease with multiple chronic ischemic infarcts as above. 3. Acute pan sinusitis, worsened from previous.  05-12-19 ct of abdomen and pelvis: Diverticulosis without diverticulitis. Bilateral renal cysts. Mild bibasilar atelectasis.  07-12-18: 2-d echo:   1. The left ventricle has severely reduced systolic function, with an ejection fraction of 20-25%. The cavity size was mildly dilated. There is mildly increased left ventricular wall thickness. Left ventricular diastolic Doppler parameters are  consistent with impaired relaxation Left ventricular diffuse hypokinesis.  2. The right ventricle has normal systolic function. The cavity was normal. There is no increase in  right ventricular wall thickness.  3.  Left atrial size was mildly dilated.  4. The mitral valve is normal in structure. There is mild thickening.  5. The tricuspid valve is normal in structure.  6. AVR.  7. The pulmonic valve was normal in structure.  8. Severe global reduction in LV systolic function; mild LVH; mild LVE; mild LAE; s/p AVR (not well visualized; mean gradient 12 mmHg; AVA 1.5 cm2 but may be underestimated); no vegetations but cannot R/O with this study; suggest TEE if clinically  indicated.  07-20-18: chest x-ray: Right IJ dialysis catheter in good position without complicating features. Cardiac enlargement and vascular congestion without overt pulmonary edema.  11-19-18: BAS: thin liquids  NO NEW EXAMS.    LABS REVIEWED PREVIOUS:   05-22-18: hgb a1c 6.5 chol 100 ldl 43; trig 170; hdl 23 07-04-18: wbc  3.7; hgb 14.0; hct 45.6; mcv 91.8; plt 124; glucose 100; bun 45; creat 2.44; k+ 4.7; na++ 139 BNP 679.0  07-06-18: wbc 1.9; hgb 15.2; hct 48.8; mcv 91.2 plt 107; glucose 136; bun 43; creat 3.01; k+ 5.9; na++ 136; ca 8.8 ast 70 alt 46; albumin 3.0 BNP 554.5   blood culture: no growth /e-coli  07-08-18: wbc 13.8; hgb 13.1; hct 41.1; mcv 93.2 plt 73 glucose 108 ;bun 74; creat 5.32; k+ 5.5; na++ 139; ca 7.5; ast 80 alt 117; albumin 2.3  07-09-18; tsh 3.098 07-17-18: wbc 3.1; hgb 1.8; hct 37.3; mcv 89.9 plt 103 glucose 114; bun 95; creat 6.00; k+ 3.5; na++ 139; ca 6.4 mag 2.1 07-27-18: INR 2.3: coumadin 5 mg daily   07-30-18: wbc  6.8; hgb 10.7; hct 33.6; mcv 89.6; plt 94 glucose 90; bun 93; creat 7.47; k+ 4.2; na++ 135; ca 7.1 08-02-18: INR 1.9   08-15-18: HIV quant: <20 CD4: 360  08-16-18: INR 3.2  08-23-18: INR  1.7 09-04-18: hgb 7.4 hct 23.6  glucose 90; bun 16; create 2.64; k+ 2.8; na++ 138; ca 8.3 phos 3.0; albumin 2.9 iron 52; tibc 218; PTH 124 vit 69.3  09-06-18: INR 2.5 guaiac neg  09-07-18: glucose 99; bun 22; creat 3.24; k+ 3.4;na++ 137; ca 8.4  09-11-18 hgb 10.3; hct 33.9 glucose 83;  bun 18; creat 2.48; k+ 3.8; na++ 140; ca 8.9 iron 43; tibc 225 PTH 115 vit D 44.6 09-20-18: INR 2.1 09-22-18: guaiac: neg  10-13-18: INR 2.4 10-19-18: INR 2.1 11-02-18: INR 2.2  11-16-18: INR 3.8 11-19-18: INR 2.2 11-26-18: INR 1.6  12-05-18: hgb a1c 6.1 12-10-18: INR 1.9 12-18-18: INR 2.2  01-01-19: INR 2.3  01-10-19: wbc 2.3; hgb 12.1; hct 37.5; mcv 96.9; plt 101; INR 3.0 01-15-19: wbc 2.0; hgb 12.8; hct 39.5; mcv 96.1; plt 100; glucose 126; bun 40; creat 5.45; k+ 3.8; na++ 136; ca 8.8; liver normal albumin 3.2; PSA 0.98; INR 3.9 01-22-19; INR 3.1  01-29-19: INR 2.7  TODAY;   02-12-19: INR 4.6     Review of Systems  Unable to perform ROS: Dementia (unable to participate )    Physical Exam Constitutional:      General: He is not in acute distress.    Appearance: He is well-developed. He is not diaphoretic.  Neck:     Musculoskeletal: Neck supple.     Thyroid: No thyromegaly.  Cardiovascular:     Rate and Rhythm: Normal rate. Rhythm irregular.     Pulses: Normal pulses.     Heart sounds: Murmur present.     Comments: 2/6 ICD present  Pulmonary:     Effort: Pulmonary effort is normal. No respiratory  distress.     Breath sounds: Normal breath sounds.  Abdominal:     General: Bowel sounds are normal. There is no distension.     Palpations: Abdomen is soft.     Tenderness: There is no abdominal tenderness.  Musculoskeletal:     Right lower leg: No edema.     Left lower leg: No edema.     Comments: Is able to move all extremities   Lymphadenopathy:     Cervical: No cervical adenopathy.  Skin:    General: Skin is warm and dry.     Comments: Left arm arm a/v fistula: + thrill + bruit             Neurological:     Mental Status: He is alert. Mental status is at baseline.  Psychiatric:        Mood and Affect: Mood normal.     ASSESSMENT/ PLAN:  TODAY:   1. Cerebrovascular accident (CVA)  2. PAF (paroxysmal atrial fibrillation) 3. S/p avr (aortic valve replacement) 4. Chronic  anticoagulation  For INR 4.6 will hold coumadin for 2 days and will repeat INR     MD is aware of resident's narcotic use and is in agreement with current plan of care. We will attempt to wean resident as appropriate.  Ok Edwards NP [facility]  Contact [phone] Monday through Friday 8am- 5pm  After hours call [phone]

## 2019-02-15 ENCOUNTER — Encounter (HOSPITAL_COMMUNITY)
Admission: RE | Admit: 2019-02-15 | Discharge: 2019-02-15 | Disposition: A | Discharge status: Home / Self Care | Payer: Medicare HMO | Source: Skilled Nursing Facility | Attending: Adult Health | Admitting: Adult Health

## 2019-02-15 ENCOUNTER — Non-Acute Institutional Stay (SKILLED_NURSING_FACILITY): Payer: Medicare HMO | Admitting: Adult Health

## 2019-02-15 ENCOUNTER — Encounter: Payer: Self-pay | Admitting: Adult Health

## 2019-02-15 DIAGNOSIS — I132 Hypertensive heart and chronic kidney disease with heart failure and with stage 5 chronic kidney disease, or end stage renal disease: Secondary | ICD-10-CM | POA: Diagnosis not present

## 2019-02-15 DIAGNOSIS — N186 End stage renal disease: Secondary | ICD-10-CM | POA: Insufficient documentation

## 2019-02-15 DIAGNOSIS — I480 Paroxysmal atrial fibrillation: Secondary | ICD-10-CM | POA: Diagnosis not present

## 2019-02-15 DIAGNOSIS — I639 Cerebral infarction, unspecified: Secondary | ICD-10-CM | POA: Diagnosis not present

## 2019-02-15 DIAGNOSIS — Z7901 Long term (current) use of anticoagulants: Secondary | ICD-10-CM | POA: Insufficient documentation

## 2019-02-15 DIAGNOSIS — Z952 Presence of prosthetic heart valve: Secondary | ICD-10-CM | POA: Diagnosis not present

## 2019-02-15 LAB — PROTIME-INR
INR: 2.9 — ABNORMAL HIGH (ref 0.8–1.2)
Prothrombin Time: 29.9 seconds — ABNORMAL HIGH (ref 11.4–15.2)

## 2019-02-15 NOTE — Progress Notes (Signed)
Location:    [place] Room Number: 111/W Place of Service:  SNF (31)   CODE STATUS: DNR  No Known Allergies  Chief Complaint  Patient presents with  . Anticoagulation    INR,    HPI:  He is on long term coumadin therapy for afib; cva; and valve replacement. His INR goal is 2.5-2.0. his previous INR was 4.6 his coumadin 3.5 mg was held. His INR today is 2.9. there are no reports of abnormal bleeding or bruising. There are no reports of missed doses; no dietary changes. There are no reports of palpitations;no uncontrolled pain.     Past Medical History:  Diagnosis Date  . AICD (automatic cardioverter/defibrillator) present   . Alcoholism in remission ([place])   . Anemia   . Aortic insufficiency   . Arthritis   . At moderate risk for fall   . Bell's palsy   . Chronic systolic heart failure (HCC)    a. EF previously 15-20% with cath in 2014 showing no significant CAD b. EF 20-25% by echo in 06/2018  . CKD (chronic kidney disease), stage IV ([place])   . COPD (chronic obstructive pulmonary disease) ([place])   . Essential hypertension, benign   . HIV disease ([place]) 09/06/2016  . Hyperlipidemia   . NICM (nonischemic cardiomyopathy) ([place])   . Noncompliance with medication regimen   . NSVT (nonsustained ventricular tachycardia) ([place])   . Numbness of right jaw    Had a stoke in 01/2013. Numbness is occasional, especially when trying to chew.  . OSA (obstructive sleep apnea)   . Productive cough 08/2013   With brown sputum   . S/P aortic valve replacement with bioprosthetic valve   . Stroke ([place]) 01/2013   weakness of right side from CVA  . Type 2 diabetes mellitus ([place])   . 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 Uses hearing aid 2014   recently received new hearing aids    Past Surgical History:  Procedure Laterality Date  . AV FISTULA PLACEMENT Left 07/20/2018   Procedure: INSERTION ARTERIOVENOUS  GORTEX GRAFT  LEFT ARM;  Surgeon: Rosetta Posner, MD;  Location: [facility];  Service: Vascular;  Laterality: Left;  . BIOPSY N/A 04/10/2014   Procedure: GASTRIC BIOPSY;  Surgeon: Daneil Dolin, MD;  Location: [facility];  Service: Endoscopy;  Laterality: N/A;  . BIOPSY  10/12/2015   Procedure: BIOPSY;  Surgeon: Daneil Dolin, MD;  Location: [facility];  Service: Endoscopy;;  stomach bx's  . CARDIAC DEFIBRILLATOR PLACEMENT  11/12/12   [employer] Inogen MINI ICD implanted in [place] at [facility] AVR per Dr Nelly Laurence note  . COLONOSCOPY WITH PROPOFOL N/A 04/10/2014   RMR: Colonic Diverticulosis  . ESOPHAGOGASTRODUODENOSCOPY (EGD) WITH PROPOFOL N/A 04/10/2014   RMR: Mild erosive reflux esophagitis. Multiple antral polyps likely hyperplastic status post removal by hot snare cautery technique. Diffusely abnormal stomach status post gastric biopsy I suspect some of patients anemaia may be due to intermittent oozing from the stomach. It would be difficult and a risky proposition to attempt complete removal of all of his gastric polyps.  . ESOPHAGOGASTRODUODENOSCOPY (EGD) WITH PROPOFOL N/A 10/12/2015   Procedure: ESOPHAGOGASTRODUODENOSCOPY (EGD) WITH PROPOFOL;  Surgeon: Daneil Dolin, MD;  [facility];  Service: Endoscopy;  Laterality: N/A;  7322 - moved to 9:15  . INSERTION OF DIALYSIS CATHETER Right 07/20/2018  Procedure: EXCHANGE OF DIALYSIS CATHETER RIGHT INTERNAL JUGULAR;  Surgeon: Rosetta Posner, MD;  Location: [facility];  Service: Vascular;  Laterality: Right;  . IR FLUORO GUIDE CV LINE RIGHT  07/09/2018  . IR US GUIDE VASC ACCESS RIGHT  07/09/2018  . POLYPECTOMY N/A 04/10/2014   Procedure: GASTRIC POLYPECTOMY;  Surgeon: Daneil Dolin, MD;  Location: [facility];  Service: Endoscopy;  Laterality: N/A;  . RIGHT HEART CATHETERIZATION N/A 02/06/2014   Procedure: RIGHT HEART CATH;  Surgeon: Larey Dresser, MD;  Location: [facility] CATH LAB;  Service:  Cardiovascular;  Laterality: N/A;    Social History   Socioeconomic History  . Marital status: Single    Spouse name: Not on file  . Number of children: Not on file  . Years of education: Not on file  . Highest education level: Not on file  Occupational History  . Occupation: disabled  Social Needs  . Financial resource strain: Not hard at all  . Food insecurity    Worry: Never true    Inability: Never true  . Transportation needs    Medical: No    Non-medical: No  Tobacco Use  . Smoking status: Former Smoker    Types: Cigarettes    Quit date: 11/05/1993    Years since quitting: 25.2  . Smokeless tobacco: Never Used  Substance and Sexual Activity  . Alcohol use: Not Currently    Comment: former heavy ETOH, none recently  . Drug use: No    Comment: prior history of cocaine use, smoking   . Sexual activity: Yes    Birth control/protection: None  Lifestyle  . Physical activity    Days per week: 0 days    Minutes per session: 0 min  . Stress: Not at all  Relationships  . Social Herbalist on phone: Never    Gets together: Once a week    Attends religious service: Never    Active member of club or organization: No    Attends meetings of clubs or organizations: Never    Relationship status: Never married  . Intimate partner violence    Fear of current or ex partner: No    Emotionally abused: No    Physically abused: No    Forced sexual activity: No  Other Topics Concern  . Not on file  Social History Narrative    Has h/o polysubstance abuse.  Baseline    2019- resident of [facility]. Unable to ambulate unassisted.            Family History  Problem Relation Age of Onset  . Kidney disease Mother   . Kidney disease Sister   . Colon cancer Neg Hx       VITAL SIGNS BP (!) 129/96   Pulse 60   Temp 98 F (36.7 C) (Oral)   Resp 20   Ht 5\' 3"  (1.6 m)   Wt 143 lb 12.8 oz (65.2 kg)   SpO2 94%   BMI 25.47 kg/m   Outpatient Encounter  Medications as of 02/15/2019  Medication Sig  . acetaminophen (TYLENOL) 325 MG tablet Take 2 tablets (650 mg total) by mouth every 4 (four) hours as needed for mild pain (or temp > 37.5 C (99.5 F)).  Marland Kitchen albuterol (PROVENTIL) (2.5 MG/3ML) 0.083% nebulizer solution Take 3 mLs (2.5 mg total) by nebulization every 4 (four) hours as needed for wheezing or shortness of breath.  Marland Kitchen atorvastatin (LIPITOR) 80 MG tablet Take 80 mg by  mouth daily.   . bictegravir-emtricitabine-tenofovir AF (BIKTARVY) 50-200-25 MG TABS tablet Take 1 tablet by mouth daily.  . calcium carbonate (TUMS EX) 750 MG chewable tablet Chew 1 tablet by mouth 2 (two) times daily.  . Cholecalciferol (VITAMIN D3) 2000 UNITS TABS Take 2,000 mg by mouth daily.  . coal tar (NEUTROGENA T-GEL) 0.5 % shampoo Apply to scalp on shower days (Twice a week) for dandruff and dry scalp Wed., and Sat.  . docusate sodium (COLACE) 100 MG capsule Take 100 mg by mouth daily.  . Fluticasone-Salmeterol (ADVAIR) 100-50 MCG/DOSE AEPB Inhale 1 puff into the lungs daily.  . metoprolol succinate (TOPROL-XL) 25 MG 24 hr tablet Take 1 tablet (25 mg total) by mouth daily.  . midodrine (PROAMATINE) 10 MG tablet Take 10 mg by mouth. Send medication with resident to be given at center. Once a day on Tues.,Thurs., Sat.  . multivitamin (RENA-VIT) TABS tablet Take 1 tablet by mouth at bedtime.  . NON FORMULARY Diet Type:  Dysphagia 2 with thin liquids  Fluid restrictions:  1200 cc / 24 hrs Breakfast = (12 oz) 360 ml Lunch = (8 oz) 240 ml Dinner (12 oz) 360 ml  . Nutritional Supplements (FEEDING SUPPLEMENT, NEPRO CARB STEADY,) LIQD Take by mouth daily.  Marland Kitchen omeprazole (PRILOSEC) 20 MG capsule Take 20 mg by mouth at bedtime.  . senna-docusate (SENEXON-S) 8.6-50 MG tablet Take 1 tablet by mouth daily.  . tamsulosin (FLOMAX) 0.4 MG CAPS capsule Take 0.4 mg by mouth daily.  Marland Kitchen UNABLE TO FIND 1.2L fluid restriction - 360 cc 912 oz) at breakfast, 240 cc (8 oz) at Lunch, 240 cc (8  oz) Dinner, 360 cc (12 oz) med pass / snacks om between meals  . ZINC OXIDE EX Apply Zinc oxide to bilateral buttocks area for irritation. Every Shift Day, Day, Day   No facility-administered encounter medications on file as of 02/15/2019.      SIGNIFICANT DIAGNOSTIC EXAMS   PREVIOUS    07-06-18: chest x-ray: Enlargement of cardiac silhouette with vascular congestion and question minimal pulmonary edema.  07-06-18: ct of head;  1. New age indeterminate RIGHT basal ganglia small infarcts. 2. Old RIGHT temporal occipital/PCA territory infarct. 3. Old basal ganglia, thalami, pontine and cerebellar small infarcts. Moderate to severe chronic small vessel ischemic changes. 4. Moderate parenchymal brain volume loss, advanced for age.  07-07-18: US renal: Medical renal disease changes of both kidneys. Small renal cysts in both kidneys. No evidence of hydronephrosis.  07-11-18: ct of head:  1. Stable head CT, with no new acute intracranial abnormality. 2. In band cerebral atrophy with chronic small vessel ischemic disease with multiple chronic ischemic infarcts as above. 3. Acute pan sinusitis, worsened from previous.  05-12-19 ct of abdomen and pelvis: Diverticulosis without diverticulitis. Bilateral renal cysts. Mild bibasilar atelectasis.  07-12-18: 2-d echo:   1. The left ventricle has severely reduced systolic function, with an ejection fraction of 20-25%. The cavity size was mildly dilated. There is mildly increased left ventricular wall thickness. Left ventricular diastolic Doppler parameters are  consistent with impaired relaxation Left ventricular diffuse hypokinesis.  2. The right ventricle has normal systolic function. The cavity was normal. There is no increase in right ventricular wall thickness.  3. Left atrial size was mildly dilated.  4. The mitral valve is normal in structure. There is mild thickening.  5. The tricuspid valve is normal in structure.  6. AVR.  7. The  pulmonic valve was normal in structure.  8. Severe global  reduction in LV systolic function; mild LVH; mild LVE; mild LAE; s/p AVR (not well visualized; mean gradient 12 mmHg; AVA 1.5 cm2 but may be underestimated); no vegetations but cannot R/O with this study; suggest TEE if clinically  indicated.  07-20-18: chest x-ray: Right IJ dialysis catheter in good position without complicating features. Cardiac enlargement and vascular congestion without overt pulmonary edema.  11-19-18: BAS: thin liquids  NO NEW EXAMS.    LABS REVIEWED PREVIOUS:   05-22-18: hgb a1c 6.5 chol 100 ldl 43; trig 170; hdl 23 07-04-18: wbc  3.7; hgb 14.0; hct 45.6; mcv 91.8; plt 124; glucose 100; bun 45; creat 2.44; k+ 4.7; na++ 139 BNP 679.0  07-06-18: wbc 1.9; hgb 15.2; hct 48.8; mcv 91.2 plt 107; glucose 136; bun 43; creat 3.01; k+ 5.9; na++ 136; ca 8.8 ast 70 alt 46; albumin 3.0 BNP 554.5   blood culture: no growth /e-coli  07-08-18: wbc 13.8; hgb 13.1; hct 41.1; mcv 93.2 plt 73 glucose 108 ;bun 74; creat 5.32; k+ 5.5; na++ 139; ca 7.5; ast 80 alt 117; albumin 2.3  07-09-18; tsh 3.098 07-17-18: wbc 3.1; hgb 1.8; hct 37.3; mcv 89.9 plt 103 glucose 114; bun 95; creat 6.00; k+ 3.5; na++ 139; ca 6.4 mag 2.1 07-27-18: INR 2.3: coumadin 5 mg daily   07-30-18: wbc  6.8; hgb 10.7; hct 33.6; mcv 89.6; plt 94 glucose 90; bun 93; creat 7.47; k+ 4.2; na++ 135; ca 7.1 08-02-18: INR 1.9   08-15-18: HIV quant: <20 CD4: 360  08-16-18: INR 3.2  08-23-18: INR  1.7 09-04-18: hgb 7.4 hct 23.6  glucose 90; bun 16; create 2.64; k+ 2.8; na++ 138; ca 8.3 phos 3.0; albumin 2.9 iron 52; tibc 218; PTH 124 vit 69.3  09-06-18: INR 2.5 guaiac neg  09-07-18: glucose 99; bun 22; creat 3.24; k+ 3.4;na++ 137; ca 8.4  09-11-18 hgb 10.3; hct 33.9 glucose 83; bun 18; creat 2.48; k+ 3.8; na++ 140; ca 8.9 iron 43; tibc 225 PTH 115 vit D 44.6 09-20-18: INR 2.1 09-22-18: guaiac: neg  10-13-18: INR 2.4 10-19-18: INR 2.1 11-02-18: INR 2.2  11-16-18: INR 3.8 11-19-18: INR 2.2  11-26-18: INR 1.6  12-05-18: hgb a1c 6.1 12-10-18: INR 1.9 12-18-18: INR 2.2  01-01-19: INR 2.3  01-10-19: wbc 2.3; hgb 12.1; hct 37.5; mcv 96.9; plt 101; INR 3.0 01-15-19: wbc 2.0; hgb 12.8; hct 39.5; mcv 96.1; plt 100; glucose 126; bun 40; creat 5.45; k+ 3.8; na++ 136; ca 8.8; liver normal albumin 3.2; PSA 0.98; INR 3.9 01-22-19; INR 3.1  01-29-19: INR 2.7 02-12-19: INR 4.6   TODAY;   02-15-19: INR 2.9     Review of Systems  Unable to perform ROS: Dementia (unable to participate )    Physical Exam Constitutional:      General: He is not in acute distress.    Appearance: He is well-developed. He is not diaphoretic.  Neck:     Musculoskeletal: Neck supple.     Thyroid: No thyromegaly.  Cardiovascular:     Rate and Rhythm: Normal rate. Rhythm irregular.     Pulses: Normal pulses.     Heart sounds: Murmur present.     Comments: 2/6 ICD present  Pulmonary:     Effort: Pulmonary effort is normal. No respiratory distress.     Breath sounds: Normal breath sounds.  Abdominal:     General: Bowel sounds are normal. There is no distension.     Palpations: Abdomen is soft.     Tenderness: There is  no abdominal tenderness.  Musculoskeletal:     Right lower leg: No edema.     Left lower leg: No edema.     Comments: Able to move all extremities   Lymphadenopathy:     Cervical: No cervical adenopathy.  Skin:    General: Skin is warm and dry.     Comments: Left arm arm a/v fistula: + thrill + bruit    Neurological:     Mental Status: He is alert. Mental status is at baseline.  Psychiatric:        Mood and Affect: Mood normal.       ASSESSMENT/ PLAN:  TODAY:   1. PAF (paroxsymal atrial firillation) 2. Cerebrovascular accident (CVA) 3. S/p avr (aortic valve replacement) 4. Chronic anticoagulation  For INR 2.9 will begin coumadin 3 mg daily and will check INR on 02-26-19      MD is aware of resident's narcotic use and is in agreement with current plan of care. We will attempt  to wean resident as appropriate.  Ok Edwards NP [facility]  Contact [phone] Monday through Friday 8am- 5pm  After hours call [phone]

## 2019-02-15 NOTE — Progress Notes (Signed)
Remote ICD transmission.   

## 2019-02-18 ENCOUNTER — Ambulatory Visit: Payer: Medicare HMO | Admitting: Internal Medicine

## 2019-02-26 ENCOUNTER — Encounter: Payer: Self-pay | Admitting: Adult Health

## 2019-02-26 ENCOUNTER — Non-Acute Institutional Stay (SKILLED_NURSING_FACILITY): Payer: Medicare HMO | Admitting: Adult Health

## 2019-02-26 ENCOUNTER — Encounter (HOSPITAL_COMMUNITY)
Admission: RE | Admit: 2019-02-26 | Discharge: 2019-02-26 | Disposition: A | Discharge status: Home / Self Care | Payer: Medicare HMO | Source: Skilled Nursing Facility | Attending: Adult Health | Admitting: Adult Health

## 2019-02-26 DIAGNOSIS — Z7901 Long term (current) use of anticoagulants: Secondary | ICD-10-CM | POA: Diagnosis not present

## 2019-02-26 DIAGNOSIS — N186 End stage renal disease: Secondary | ICD-10-CM | POA: Insufficient documentation

## 2019-02-26 DIAGNOSIS — I132 Hypertensive heart and chronic kidney disease with heart failure and with stage 5 chronic kidney disease, or end stage renal disease: Secondary | ICD-10-CM | POA: Diagnosis present

## 2019-02-26 DIAGNOSIS — I639 Cerebral infarction, unspecified: Secondary | ICD-10-CM | POA: Diagnosis not present

## 2019-02-26 DIAGNOSIS — Z952 Presence of prosthetic heart valve: Secondary | ICD-10-CM | POA: Diagnosis not present

## 2019-02-26 DIAGNOSIS — I480 Paroxysmal atrial fibrillation: Secondary | ICD-10-CM

## 2019-02-26 LAB — PROTIME-INR
INR: 1.9 — ABNORMAL HIGH (ref 0.8–1.2)
Prothrombin Time: 21.2 seconds — ABNORMAL HIGH (ref 11.4–15.2)

## 2019-02-26 NOTE — Progress Notes (Signed)
Location:    [place] Room Number: 111/W Place of Service:  SNF (31)   CODE STATUS: DNR  No Known Allergies  Chief Complaint  Patient presents with  . Anticoagulation    INR     HPI:   He is on long term coumadin therapy for afib; cva and valve replacement. His INR goal is 2.5-3.0.   His previous INR was 2.9 and is  taking 3.5 mg daily. His INR today is 1.9. there are no report of changes in his diet. No reports of missed doses; no reports of abnormal bleeding or bruising present. No chest pain no palpitations.     Past Medical History:  Diagnosis Date  . AICD (automatic cardioverter/defibrillator) present   . Alcoholism in remission ([place])   . Anemia   . Aortic insufficiency   . Arthritis   . At moderate risk for fall   . Bell's palsy   . Chronic systolic heart failure (HCC)    a. EF previously 15-20% with cath in 2014 showing no significant CAD b. EF 20-25% by echo in 06/2018  . CKD (chronic kidney disease), stage IV ([place])   . COPD (chronic obstructive pulmonary disease) ([place])   . Essential hypertension, benign   . HIV disease ([place]) 09/06/2016  . Hyperlipidemia   . NICM (nonischemic cardiomyopathy) ([place])   . Noncompliance with medication regimen   . NSVT (nonsustained ventricular tachycardia) ([place])   . Numbness of right jaw    Had a stoke in 01/2013. Numbness is occasional, especially when trying to chew.  . OSA (obstructive sleep apnea)   . Productive cough 08/2013   With brown sputum   . S/P aortic valve replacement with bioprosthetic valve   . Stroke ([place]) 01/2013   weakness of right side from CVA  . Type 2 diabetes mellitus ([place])   . 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 Uses hearing aid 2014   recently received new hearing aids    Past Surgical History:  Procedure Laterality Date  . AV FISTULA PLACEMENT Left 07/20/2018   Procedure: INSERTION ARTERIOVENOUS GORTEX GRAFT   LEFT ARM;  Surgeon: Rosetta Posner, MD;  Location: [place] [facility];  Service: Vascular;  Laterality: Left;  . BIOPSY N/A 04/10/2014   Procedure: GASTRIC BIOPSY;  Surgeon: Daneil Dolin, MD;  Location: [facility];  Service: Endoscopy;  Laterality: N/A;  . BIOPSY  10/12/2015   Procedure: BIOPSY;  Surgeon: Daneil Dolin, MD;  Location: [facility];  Service: Endoscopy;;  stomach bx's  . CARDIAC DEFIBRILLATOR PLACEMENT  11/12/12   [employer] Inogen MINI ICD implanted in [place] at [place] AVR per Dr Nelly Laurence note  . COLONOSCOPY WITH PROPOFOL N/A 04/10/2014   RMR: Colonic Diverticulosis  . ESOPHAGOGASTRODUODENOSCOPY (EGD) WITH PROPOFOL N/A 04/10/2014   RMR: Mild erosive reflux esophagitis. Multiple antral polyps likely hyperplastic status post removal by hot snare cautery technique. Diffusely abnormal stomach status post gastric biopsy I suspect some of patients anemaia may be due to intermittent oozing from the stomach. It would be difficult and a risky proposition to attempt complete removal of all of his gastric polyps.  . ESOPHAGOGASTRODUODENOSCOPY (EGD) WITH PROPOFOL N/A 10/12/2015   Procedure: ESOPHAGOGASTRODUODENOSCOPY (EGD) WITH PROPOFOL;  Surgeon: Daneil Dolin, MD;  [facility];  Service: Endoscopy;  Laterality: N/A;  1779 - moved to 9:15  . INSERTION  OF DIALYSIS CATHETER Right 07/20/2018   Procedure: EXCHANGE OF DIALYSIS CATHETER RIGHT INTERNAL JUGULAR;  Surgeon: Rosetta Posner, MD;  Location: [facility];  Service: Vascular;  Laterality: Right;  . IR FLUORO GUIDE CV LINE RIGHT  07/09/2018  . IR US GUIDE VASC ACCESS RIGHT  07/09/2018  . POLYPECTOMY N/A 04/10/2014   Procedure: GASTRIC POLYPECTOMY;  Surgeon: Daneil Dolin, MD;  Location: [facility];  Service: Endoscopy;  Laterality: N/A;  . RIGHT HEART CATHETERIZATION N/A 02/06/2014   Procedure: RIGHT HEART CATH;  Surgeon: Larey Dresser, MD;  Location: [facility] CATH LAB;  Service: Cardiovascular;   Laterality: N/A;    Social History   Socioeconomic History  . Marital status: Single    Spouse name: Not on file  . Number of children: Not on file  . Years of education: Not on file  . Highest education level: Not on file  Occupational History  . Occupation: disabled  Social Needs  . Financial resource strain: Not hard at all  . Food insecurity    Worry: Never true    Inability: Never true  . Transportation needs    Medical: No    Non-medical: No  Tobacco Use  . Smoking status: Former Smoker    Types: Cigarettes    Quit date: 11/05/1993    Years since quitting: 25.3  . Smokeless tobacco: Never Used  Substance and Sexual Activity  . Alcohol use: Not Currently    Comment: former heavy ETOH, none recently  . Drug use: No    Comment: prior history of cocaine use, smoking   . Sexual activity: Yes    Birth control/protection: None  Lifestyle  . Physical activity    Days per week: 0 days    Minutes per session: 0 min  . Stress: Not at all  Relationships  . Social Herbalist on phone: Never    Gets together: Once a week    Attends religious service: Never    Active member of club or organization: No    Attends meetings of clubs or organizations: Never    Relationship status: Never married  . Intimate partner violence    Fear of current or ex partner: No    Emotionally abused: No    Physically abused: No    Forced sexual activity: No  Other Topics Concern  . Not on file  Social History Narrative    Has h/o polysubstance abuse.  Baseline    2019- resident of [facility]. Unable to ambulate unassisted.            Family History  Problem Relation Age of Onset  . Kidney disease Mother   . Kidney disease Sister   . Colon cancer Neg Hx       VITAL SIGNS BP 92/63   Pulse 84   Temp 97.9 F (36.6 C) (Oral)   Resp 20   Ht 5\' 3"  (1.6 m)   Wt 143 lb 12.8 oz (65.2 kg)   SpO2 94%   BMI 25.47 kg/m   Outpatient Encounter Medications as of  02/26/2019  Medication Sig  . acetaminophen (TYLENOL) 325 MG tablet Take 2 tablets (650 mg total) by mouth every 4 (four) hours as needed for mild pain (or temp > 37.5 C (99.5 F)).  Marland Kitchen albuterol (PROVENTIL) (2.5 MG/3ML) 0.083% nebulizer solution Take 3 mLs (2.5 mg total) by nebulization every 4 (four) hours as needed for wheezing or shortness of breath.  Marland Kitchen atorvastatin (LIPITOR) 80  MG tablet Take 80 mg by mouth daily.   . bictegravir-emtricitabine-tenofovir AF (BIKTARVY) 50-200-25 MG TABS tablet Take 1 tablet by mouth daily.  . calcium carbonate (TUMS EX) 750 MG chewable tablet Chew 1 tablet by mouth 2 (two) times daily.  . Cholecalciferol (VITAMIN D3) 2000 UNITS TABS Take 2,000 mg by mouth daily.  . coal tar (NEUTROGENA T-GEL) 0.5 % shampoo Apply to scalp on shower days (Twice a week) for dandruff and dry scalp Wed., and Sat.  . docusate sodium (COLACE) 100 MG capsule Take 100 mg by mouth daily.  . Fluticasone-Salmeterol (ADVAIR) 100-50 MCG/DOSE AEPB Inhale 1 puff into the lungs daily.  . metoprolol succinate (TOPROL-XL) 25 MG 24 hr tablet Take 1 tablet (25 mg total) by mouth daily.  . midodrine (PROAMATINE) 10 MG tablet Take 10 mg by mouth. Send medication with resident to be given at center. Once a day on Tues.,Thurs., Sat.  . multivitamin (RENA-VIT) TABS tablet Take 1 tablet by mouth at bedtime.  . NON FORMULARY Diet Type:  Dysphagia 2 with thin liquids (NO Straws)  . Nutritional Supplements (FEEDING SUPPLEMENT, NEPRO CARB STEADY,) LIQD Take by mouth daily.  Marland Kitchen omeprazole (PRILOSEC) 20 MG capsule Take 20 mg by mouth at bedtime.  . senna-docusate (SENEXON-S) 8.6-50 MG tablet Take 1 tablet by mouth daily.  . tamsulosin (FLOMAX) 0.4 MG CAPS capsule Take 0.4 mg by mouth daily.  Marland Kitchen UNABLE TO FIND FLUID RESTRICTION 1200 CC/ 24hrs. Breakfast (12oz) 360 ml lunch (8oz) 240 ml Dinner (12oz) 360 ml Every Shift Day, Evening, Night  . warfarin (COUMADIN) 1 MG tablet Take 1 mg by mouth daily. Give along  with 2.5 mg tab for total of 3.5 daily  . warfarin (COUMADIN) 2.5 MG tablet Take 2.5 mg by mouth daily. Give along with 1 mg tab for total of 3.5 mg daily  . ZINC OXIDE EX Apply Zinc oxide to bilateral buttocks area for irritation. Every Shift Day, Day, Day   No facility-administered encounter medications on file as of 02/26/2019.      SIGNIFICANT DIAGNOSTIC EXAMS  PREVIOUS    07-06-18: chest x-ray: Enlargement of cardiac silhouette with vascular congestion and question minimal pulmonary edema.  07-06-18: ct of head;  1. New age indeterminate RIGHT basal ganglia small infarcts. 2. Old RIGHT temporal occipital/PCA territory infarct. 3. Old basal ganglia, thalami, pontine and cerebellar small infarcts. Moderate to severe chronic small vessel ischemic changes. 4. Moderate parenchymal brain volume loss, advanced for age.  07-07-18: US renal: Medical renal disease changes of both kidneys. Small renal cysts in both kidneys. No evidence of hydronephrosis.  07-11-18: ct of head:  1. Stable head CT, with no new acute intracranial abnormality. 2. In band cerebral atrophy with chronic small vessel ischemic disease with multiple chronic ischemic infarcts as above. 3. Acute pan sinusitis, worsened from previous.  05-12-19 ct of abdomen and pelvis: Diverticulosis without diverticulitis. Bilateral renal cysts. Mild bibasilar atelectasis.  07-12-18: 2-d echo:   1. The left ventricle has severely reduced systolic function, with an ejection fraction of 20-25%. The cavity size was mildly dilated. There is mildly increased left ventricular wall thickness. Left ventricular diastolic Doppler parameters are  consistent with impaired relaxation Left ventricular diffuse hypokinesis.  2. The right ventricle has normal systolic function. The cavity was normal. There is no increase in right ventricular wall thickness.  3. Left atrial size was mildly dilated.  4. The mitral valve is normal in structure. There is  mild thickening.  5. The tricuspid valve  is normal in structure.  6. AVR.  7. The pulmonic valve was normal in structure.  8. Severe global reduction in LV systolic function; mild LVH; mild LVE; mild LAE; s/p AVR (not well visualized; mean gradient 12 mmHg; AVA 1.5 cm2 but may be underestimated); no vegetations but cannot R/O with this study; suggest TEE if clinically  indicated.  07-20-18: chest x-ray: Right IJ dialysis catheter in good position without complicating features. Cardiac enlargement and vascular congestion without overt pulmonary edema.  11-19-18: BAS: thin liquids  NO NEW EXAMS.    LABS REVIEWED PREVIOUS:   05-22-18: hgb a1c 6.5 chol 100 ldl 43; trig 170; hdl 23 07-04-18: wbc  3.7; hgb 14.0; hct 45.6; mcv 91.8; plt 124; glucose 100; bun 45; creat 2.44; k+ 4.7; na++ 139 BNP 679.0  07-06-18: wbc 1.9; hgb 15.2; hct 48.8; mcv 91.2 plt 107; glucose 136; bun 43; creat 3.01; k+ 5.9; na++ 136; ca 8.8 ast 70 alt 46; albumin 3.0 BNP 554.5   blood culture: no growth /e-coli  07-08-18: wbc 13.8; hgb 13.1; hct 41.1; mcv 93.2 plt 73 glucose 108 ;bun 74; creat 5.32; k+ 5.5; na++ 139; ca 7.5; ast 80 alt 117; albumin 2.3  07-09-18; tsh 3.098 07-17-18: wbc 3.1; hgb 1.8; hct 37.3; mcv 89.9 plt 103 glucose 114; bun 95; creat 6.00; k+ 3.5; na++ 139; ca 6.4 mag 2.1 07-27-18: INR 2.3: coumadin 5 mg daily   07-30-18: wbc  6.8; hgb 10.7; hct 33.6; mcv 89.6; plt 94 glucose 90; bun 93; creat 7.47; k+ 4.2; na++ 135; ca 7.1 08-02-18: INR 1.9   08-15-18: HIV quant: <20 CD4: 360  08-16-18: INR 3.2  08-23-18: INR  1.7 09-04-18: hgb 7.4 hct 23.6  glucose 90; bun 16; create 2.64; k+ 2.8; na++ 138; ca 8.3 phos 3.0; albumin 2.9 iron 52; tibc 218; PTH 124 vit 69.3  09-06-18: INR 2.5 guaiac neg  09-07-18: glucose 99; bun 22; creat 3.24; k+ 3.4;na++ 137; ca 8.4  09-11-18 hgb 10.3; hct 33.9 glucose 83; bun 18; creat 2.48; k+ 3.8; na++ 140; ca 8.9 iron 43; tibc 225 PTH 115 vit D 44.6 09-20-18: INR 2.1 09-22-18: guaiac: neg   10-13-18: INR 2.4 10-19-18: INR 2.1 11-02-18: INR 2.2  11-16-18: INR 3.8 11-19-18: INR 2.2 11-26-18: INR 1.6  12-05-18: hgb a1c 6.1 12-10-18: INR 1.9 12-18-18: INR 2.2  01-01-19: INR 2.3  01-10-19: wbc 2.3; hgb 12.1; hct 37.5; mcv 96.9; plt 101; INR 3.0 01-15-19: wbc 2.0; hgb 12.8; hct 39.5; mcv 96.1; plt 100; glucose 126; bun 40; creat 5.45; k+ 3.8; na++ 136; ca 8.8; liver normal albumin 3.2; PSA 0.98; INR 3.9 01-22-19; INR 3.1  01-29-19: INR 2.7 02-12-19: INR 4.6  02-15-19: INR 2.9   TODAY;   02-26-19: INR 1.9    Review of Systems  Unable to perform ROS: Dementia (unable to participate )   Physical Exam Constitutional:      General: He is not in acute distress.    Appearance: He is well-developed. He is not diaphoretic.  Neck:     Musculoskeletal: Neck supple.     Thyroid: No thyromegaly.  Cardiovascular:     Rate and Rhythm: Normal rate. Rhythm irregular.     Pulses: Normal pulses.     Heart sounds: Murmur present.     Comments: 2/6 ICD present  Pulmonary:     Effort: Pulmonary effort is normal. No respiratory distress.     Breath sounds: Normal breath sounds.  Abdominal:     General: Bowel  sounds are normal. There is no distension.     Palpations: Abdomen is soft.     Tenderness: There is no abdominal tenderness.  Musculoskeletal:     Right lower leg: No edema.     Left lower leg: No edema.     Comments: Able to move all extremities   Lymphadenopathy:     Cervical: No cervical adenopathy.  Skin:    General: Skin is warm and dry.     Comments: Left upper arm a/v fistula: + thrill + bruit     Neurological:     Mental Status: He is alert. Mental status is at baseline.  Psychiatric:        Mood and Affect: Mood normal.      ASSESSMENT/ PLAN:  TODAY:   1. Cerebrovascular accident (CVA) 2. PAF (paroxsymal atrial fibrillation) 3. S/p avr (aortic valve replacement) 4. Chronic anticoagulation   For INR 1.9 will begin coumadin 4 mg daily and will check INR on 03-05-19    MD is aware of resident's narcotic use and is in agreement with current plan of care. We will attempt to wean resident as appropriate.  Ok Edwards NP [facility]  Contact [phone] Monday through Friday 8am- 5pm  After hours call [phone]

## 2019-03-02 ENCOUNTER — Encounter (HOSPITAL_COMMUNITY): Payer: Self-pay

## 2019-03-02 ENCOUNTER — Emergency Department (HOSPITAL_COMMUNITY): Payer: Medicare HMO

## 2019-03-02 ENCOUNTER — Inpatient Hospital Stay (HOSPITAL_COMMUNITY)
Admission: EM | Admit: 2019-03-02 | Discharge: 2019-03-08 | DRG: 312 | Disposition: A | Discharge status: Skilled Nursing Facility | Payer: Medicare HMO | Source: Skilled Nursing Facility | Attending: Internal Medicine | Admitting: Internal Medicine

## 2019-03-02 ENCOUNTER — Other Ambulatory Visit: Payer: Self-pay

## 2019-03-02 DIAGNOSIS — N35919 Unspecified urethral stricture, male, unspecified site: Secondary | ICD-10-CM | POA: Diagnosis present

## 2019-03-02 DIAGNOSIS — Z23 Encounter for immunization: Secondary | ICD-10-CM | POA: Diagnosis present

## 2019-03-02 DIAGNOSIS — I5022 Chronic systolic (congestive) heart failure: Secondary | ICD-10-CM | POA: Diagnosis present

## 2019-03-02 DIAGNOSIS — G8194 Hemiplegia, unspecified affecting left nondominant side: Secondary | ICD-10-CM | POA: Diagnosis present

## 2019-03-02 DIAGNOSIS — B20 Human immunodeficiency virus [HIV] disease: Secondary | ICD-10-CM | POA: Diagnosis present

## 2019-03-02 DIAGNOSIS — R55 Syncope and collapse: Secondary | ICD-10-CM | POA: Diagnosis not present

## 2019-03-02 DIAGNOSIS — R188 Other ascites: Secondary | ICD-10-CM | POA: Diagnosis not present

## 2019-03-02 DIAGNOSIS — J438 Other emphysema: Secondary | ICD-10-CM | POA: Diagnosis not present

## 2019-03-02 DIAGNOSIS — N138 Other obstructive and reflux uropathy: Secondary | ICD-10-CM | POA: Diagnosis present

## 2019-03-02 DIAGNOSIS — Z95810 Presence of automatic (implantable) cardiac defibrillator: Secondary | ICD-10-CM | POA: Diagnosis present

## 2019-03-02 DIAGNOSIS — E1122 Type 2 diabetes mellitus with diabetic chronic kidney disease: Secondary | ICD-10-CM | POA: Diagnosis present

## 2019-03-02 DIAGNOSIS — I639 Cerebral infarction, unspecified: Secondary | ICD-10-CM | POA: Diagnosis not present

## 2019-03-02 DIAGNOSIS — I5042 Chronic combined systolic (congestive) and diastolic (congestive) heart failure: Secondary | ICD-10-CM | POA: Diagnosis present

## 2019-03-02 DIAGNOSIS — Z974 Presence of external hearing-aid: Secondary | ICD-10-CM

## 2019-03-02 DIAGNOSIS — I428 Other cardiomyopathies: Secondary | ICD-10-CM | POA: Diagnosis present

## 2019-03-02 DIAGNOSIS — E785 Hyperlipidemia, unspecified: Secondary | ICD-10-CM | POA: Diagnosis present

## 2019-03-02 DIAGNOSIS — I132 Hypertensive heart and chronic kidney disease with heart failure and with stage 5 chronic kidney disease, or end stage renal disease: Secondary | ICD-10-CM | POA: Diagnosis present

## 2019-03-02 DIAGNOSIS — I120 Hypertensive chronic kidney disease with stage 5 chronic kidney disease or end stage renal disease: Secondary | ICD-10-CM

## 2019-03-02 DIAGNOSIS — E11649 Type 2 diabetes mellitus with hypoglycemia without coma: Secondary | ICD-10-CM | POA: Diagnosis present

## 2019-03-02 DIAGNOSIS — I2510 Atherosclerotic heart disease of native coronary artery without angina pectoris: Secondary | ICD-10-CM | POA: Diagnosis present

## 2019-03-02 DIAGNOSIS — Z20828 Contact with and (suspected) exposure to other viral communicable diseases: Secondary | ICD-10-CM | POA: Diagnosis present

## 2019-03-02 DIAGNOSIS — Z66 Do not resuscitate: Secondary | ICD-10-CM | POA: Diagnosis present

## 2019-03-02 DIAGNOSIS — N401 Enlarged prostate with lower urinary tract symptoms: Secondary | ICD-10-CM | POA: Diagnosis present

## 2019-03-02 DIAGNOSIS — R29707 NIHSS score 7: Secondary | ICD-10-CM | POA: Diagnosis present

## 2019-03-02 DIAGNOSIS — Z953 Presence of xenogenic heart valve: Secondary | ICD-10-CM | POA: Diagnosis not present

## 2019-03-02 DIAGNOSIS — I10 Essential (primary) hypertension: Secondary | ICD-10-CM | POA: Diagnosis present

## 2019-03-02 DIAGNOSIS — N329 Bladder disorder, unspecified: Secondary | ICD-10-CM | POA: Diagnosis not present

## 2019-03-02 DIAGNOSIS — M1990 Unspecified osteoarthritis, unspecified site: Secondary | ICD-10-CM | POA: Diagnosis present

## 2019-03-02 DIAGNOSIS — R471 Dysarthria and anarthria: Secondary | ICD-10-CM | POA: Diagnosis present

## 2019-03-02 DIAGNOSIS — H9190 Unspecified hearing loss, unspecified ear: Secondary | ICD-10-CM | POA: Diagnosis present

## 2019-03-02 DIAGNOSIS — Z7951 Long term (current) use of inhaled steroids: Secondary | ICD-10-CM

## 2019-03-02 DIAGNOSIS — N186 End stage renal disease: Secondary | ICD-10-CM

## 2019-03-02 DIAGNOSIS — Z841 Family history of disorders of kidney and ureter: Secondary | ICD-10-CM

## 2019-03-02 DIAGNOSIS — E1151 Type 2 diabetes mellitus with diabetic peripheral angiopathy without gangrene: Secondary | ICD-10-CM | POA: Diagnosis present

## 2019-03-02 DIAGNOSIS — K219 Gastro-esophageal reflux disease without esophagitis: Secondary | ICD-10-CM | POA: Diagnosis present

## 2019-03-02 DIAGNOSIS — M898X9 Other specified disorders of bone, unspecified site: Secondary | ICD-10-CM | POA: Diagnosis present

## 2019-03-02 DIAGNOSIS — J449 Chronic obstructive pulmonary disease, unspecified: Secondary | ICD-10-CM | POA: Diagnosis present

## 2019-03-02 DIAGNOSIS — I340 Nonrheumatic mitral (valve) insufficiency: Secondary | ICD-10-CM | POA: Diagnosis not present

## 2019-03-02 DIAGNOSIS — I472 Ventricular tachycardia: Secondary | ICD-10-CM | POA: Diagnosis present

## 2019-03-02 DIAGNOSIS — Z87891 Personal history of nicotine dependence: Secondary | ICD-10-CM

## 2019-03-02 DIAGNOSIS — I361 Nonrheumatic tricuspid (valve) insufficiency: Secondary | ICD-10-CM | POA: Diagnosis not present

## 2019-03-02 DIAGNOSIS — Z992 Dependence on renal dialysis: Secondary | ICD-10-CM | POA: Diagnosis not present

## 2019-03-02 DIAGNOSIS — Z9181 History of falling: Secondary | ICD-10-CM

## 2019-03-02 DIAGNOSIS — Z7901 Long term (current) use of anticoagulants: Secondary | ICD-10-CM

## 2019-03-02 DIAGNOSIS — I480 Paroxysmal atrial fibrillation: Secondary | ICD-10-CM | POA: Diagnosis present

## 2019-03-02 DIAGNOSIS — I255 Ischemic cardiomyopathy: Secondary | ICD-10-CM | POA: Diagnosis present

## 2019-03-02 DIAGNOSIS — R339 Retention of urine, unspecified: Secondary | ICD-10-CM | POA: Diagnosis not present

## 2019-03-02 DIAGNOSIS — G4733 Obstructive sleep apnea (adult) (pediatric): Secondary | ICD-10-CM | POA: Diagnosis present

## 2019-03-02 DIAGNOSIS — R4189 Other symptoms and signs involving cognitive functions and awareness: Secondary | ICD-10-CM | POA: Diagnosis present

## 2019-03-02 DIAGNOSIS — I4729 Other ventricular tachycardia: Secondary | ICD-10-CM

## 2019-03-02 DIAGNOSIS — D631 Anemia in chronic kidney disease: Secondary | ICD-10-CM | POA: Diagnosis present

## 2019-03-02 DIAGNOSIS — N320 Bladder-neck obstruction: Secondary | ICD-10-CM | POA: Diagnosis present

## 2019-03-02 DIAGNOSIS — E1169 Type 2 diabetes mellitus with other specified complication: Secondary | ICD-10-CM | POA: Diagnosis present

## 2019-03-02 DIAGNOSIS — R338 Other retention of urine: Secondary | ICD-10-CM | POA: Diagnosis not present

## 2019-03-02 DIAGNOSIS — F0150 Vascular dementia without behavioral disturbance: Secondary | ICD-10-CM | POA: Diagnosis present

## 2019-03-02 DIAGNOSIS — N3289 Other specified disorders of bladder: Secondary | ICD-10-CM | POA: Diagnosis not present

## 2019-03-02 DIAGNOSIS — G510 Bell's palsy: Secondary | ICD-10-CM

## 2019-03-02 DIAGNOSIS — N250 Renal osteodystrophy: Secondary | ICD-10-CM | POA: Diagnosis present

## 2019-03-02 LAB — COMPREHENSIVE METABOLIC PANEL
ALT: 45 U/L — ABNORMAL HIGH (ref 0–44)
AST: 47 U/L — ABNORMAL HIGH (ref 15–41)
Albumin: 2.9 g/dL — ABNORMAL LOW (ref 3.5–5.0)
Alkaline Phosphatase: 161 U/L — ABNORMAL HIGH (ref 38–126)
Anion gap: 12 (ref 5–15)
BUN: 19 mg/dL (ref 8–23)
CO2: 26 mmol/L (ref 22–32)
Calcium: 8.3 mg/dL — ABNORMAL LOW (ref 8.9–10.3)
Chloride: 98 mmol/L (ref 98–111)
Creatinine, Ser: 2.53 mg/dL — ABNORMAL HIGH (ref 0.61–1.24)
GFR calc Af Amer: 29 mL/min — ABNORMAL LOW (ref >60)
GFR calc non Af Amer: 25 mL/min — ABNORMAL LOW (ref >60)
Glucose, Bld: 106 mg/dL — ABNORMAL HIGH (ref 70–99)
Potassium: 3.1 mmol/L — ABNORMAL LOW (ref 3.5–5.1)
Sodium: 136 mmol/L (ref 135–145)
Total Bilirubin: 1.8 mg/dL — ABNORMAL HIGH (ref 0.3–1.2)
Total Protein: 7 g/dL (ref 6.5–8.1)

## 2019-03-02 LAB — CBC
HCT: 36.3 % — ABNORMAL LOW (ref 39.0–52.0)
Hemoglobin: 12.4 g/dL — ABNORMAL LOW (ref 13.0–17.0)
MCH: 31.6 pg (ref 26.0–34.0)
MCHC: 34.2 g/dL (ref 30.0–36.0)
MCV: 92.6 fL (ref 80.0–100.0)
Platelets: 79 10*3/uL — ABNORMAL LOW (ref 150–400)
RBC: 3.92 MIL/uL — ABNORMAL LOW (ref 4.22–5.81)
RDW: 20.4 % — ABNORMAL HIGH (ref 11.5–15.5)
WBC: 2.4 10*3/uL — ABNORMAL LOW (ref 4.0–10.5)
nRBC: 0 % (ref 0.0–0.2)

## 2019-03-02 LAB — DIFFERENTIAL
Abs Immature Granulocytes: 0.01 10*3/uL (ref 0.00–0.07)
Basophils Absolute: 0 10*3/uL (ref 0.0–0.1)
Basophils Relative: 0 %
Eosinophils Absolute: 0 10*3/uL (ref 0.0–0.5)
Eosinophils Relative: 1 %
Immature Granulocytes: 0 %
Lymphocytes Relative: 21 %
Lymphs Abs: 0.5 10*3/uL — ABNORMAL LOW (ref 0.7–4.0)
Monocytes Absolute: 0.3 10*3/uL (ref 0.1–1.0)
Monocytes Relative: 12 %
Neutro Abs: 1.6 10*3/uL — ABNORMAL LOW (ref 1.7–7.7)
Neutrophils Relative %: 66 %

## 2019-03-02 LAB — APTT: aPTT: 42 seconds — ABNORMAL HIGH (ref 24–36)

## 2019-03-02 LAB — ETHANOL: Alcohol, Ethyl (B): <10 mg/dL (ref <10)

## 2019-03-02 LAB — CBG MONITORING, ED: Glucose-Capillary: 100 mg/dL — ABNORMAL HIGH (ref 70–99)

## 2019-03-02 LAB — PROTIME-INR
INR: 2 — ABNORMAL HIGH (ref 0.8–1.2)
Prothrombin Time: 22.2 seconds — ABNORMAL HIGH (ref 11.4–15.2)

## 2019-03-02 MED ORDER — WARFARIN - PHYSICIAN DOSING INPATIENT: Freq: Every day | Status: DC

## 2019-03-02 MED ORDER — BICTEGRAVIR-EMTRICITAB-TENOFOV 50-200-25 MG PO TABS
1.0000 | ORAL_TABLET | Freq: Every day | ORAL | Status: DC
  Filled 2019-03-02 (×2): qty 1

## 2019-03-02 MED ORDER — ACETAMINOPHEN 325 MG PO TABS
650.0000 mg | ORAL_TABLET | ORAL | Status: DC | PRN
  Administered 2019-03-03: 650 mg via ORAL
  Filled 2019-03-02: qty 2

## 2019-03-02 MED ORDER — TAMSULOSIN HCL 0.4 MG PO CAPS: 0.4000 mg | ORAL_CAPSULE | Freq: Every day | ORAL | Status: DC

## 2019-03-02 MED ORDER — METOPROLOL SUCCINATE ER 25 MG PO TB24: 25.0000 mg | ORAL_TABLET | Freq: Every day | ORAL | Status: DC

## 2019-03-02 MED ORDER — ATORVASTATIN CALCIUM 40 MG PO TABS: 80.0000 mg | ORAL_TABLET | Freq: Every day | ORAL | Status: DC

## 2019-03-02 MED ORDER — WARFARIN SODIUM 2.5 MG PO TABS: 3.5000 mg | ORAL_TABLET | Freq: Every day | ORAL | Status: DC

## 2019-03-02 MED ORDER — ALBUTEROL SULFATE HFA 108 (90 BASE) MCG/ACT IN AERS
2.0000 | INHALATION_SPRAY | RESPIRATORY_TRACT | Status: DC | PRN
  Filled 2019-03-02: qty 6.7

## 2019-03-02 MED ORDER — ADULT MULTIVITAMIN W/MINERALS CH
1.0000 | ORAL_TABLET | Freq: Every day | ORAL | Status: DC
  Administered 2019-03-04 – 2019-03-07 (×4): 1 via ORAL
  Filled 2019-03-02 (×5): qty 1

## 2019-03-02 MED ORDER — RENA-VITE PO TABS: 1.0000 | ORAL_TABLET | Freq: Every day | ORAL | Status: DC

## 2019-03-02 MED ORDER — VITAMIN D 25 MCG (1000 UNIT) PO TABS
2000.0000 [IU] | ORAL_TABLET | Freq: Every day | ORAL | Status: DC
  Administered 2019-03-04 – 2019-03-08 (×5): 2000 [IU] via ORAL
  Filled 2019-03-02 (×5): qty 2

## 2019-03-02 MED ORDER — CALCIUM CARBONATE ANTACID 750 MG PO CHEW: 1.0000 | CHEWABLE_TABLET | Freq: Two times a day (BID) | ORAL | Status: DC

## 2019-03-02 MED ORDER — WARFARIN SODIUM 1 MG PO TABS: 1.0000 mg | ORAL_TABLET | Freq: Every day | ORAL | Status: DC

## 2019-03-02 MED ORDER — PANTOPRAZOLE SODIUM 40 MG PO TBEC: 40.0000 mg | DELAYED_RELEASE_TABLET | Freq: Every day | ORAL | Status: DC

## 2019-03-02 MED ORDER — SENNOSIDES-DOCUSATE SODIUM 8.6-50 MG PO TABS: 1.0000 | ORAL_TABLET | Freq: Every day | ORAL | Status: DC

## 2019-03-02 MED ORDER — IOHEXOL 350 MG/ML SOLN
75.0000 mL | Freq: Once | INTRAVENOUS | Status: AC | PRN
  Administered 2019-03-02: 75 mL via INTRAVENOUS

## 2019-03-02 MED ORDER — DOCUSATE SODIUM 100 MG PO CAPS
100.0000 mg | ORAL_CAPSULE | Freq: Every day | ORAL | Status: DC
  Administered 2019-03-04 – 2019-03-08 (×5): 100 mg via ORAL
  Filled 2019-03-02 (×5): qty 1

## 2019-03-02 MED ORDER — FLUTICASONE FUROATE-VILANTEROL 100-25 MCG/INH IN AEPB
1.0000 | INHALATION_SPRAY | Freq: Every day | RESPIRATORY_TRACT | Status: DC
  Administered 2019-03-03 – 2019-03-08 (×6): 1 via RESPIRATORY_TRACT
  Filled 2019-03-02: qty 28

## 2019-03-02 MED ORDER — INFLUENZA VAC A&B SA ADJ QUAD 0.5 ML IM PRSY
0.5000 mL | PREFILLED_SYRINGE | INTRAMUSCULAR | Status: AC
  Administered 2019-03-03: 0.5 mL via INTRAMUSCULAR
  Filled 2019-03-02: qty 0.5

## 2019-03-02 MED ORDER — WARFARIN SODIUM 2.5 MG PO TABS: 2.5000 mg | ORAL_TABLET | Freq: Every day | ORAL | Status: DC

## 2019-03-02 NOTE — H&P (Signed)
History and Physical    Keith Young CHE:[id number] DOB: Jul 05, 1950 DOA: 03/02/2019  PCP: [name], MD (Confirm with patient/family/NH records and if not entered, this has to be entered at [facility] point of entry) Patient coming from: Nursing facility   I have personally briefly reviewed patient's old medical records in [place]  Chief Complaint: Stroke like symptoms   HPI: 68 year old man from nursing facility with past medical history is reviewed in the EMR sent to the ED with symptoms of unresponsiveness at the facility.  Patient unable to provide clear history as per documentation patient was in his usual state of health before 2:58 PM when became unresponsive but breathing spontaneously postdialysis.  EMS was called and patient was transported to the the ED.  In the ER patient was able to converse but could not precisely detail the incidence.  In the ER patient was hemodynamically stable with low normal blood pressure saturating 98% on room air with no tachycardia or tachypnea has been afebrile with T-max of 97.9, labs with mild hypokalemia 3.1 BUN 19 creatinine 2.53 liver chemistry slightly deranged, CBC with leukopenia which is chronic H&H stable with hemoglobin 12.8, INR of 2 he was evaluated with a CT scan of the head and neck with stroke protocol which was negative for large occlusive occlusion.  Tele neurology was consulted for TPA, as the patient was on Coumadin TPA administration was not considered as treatment modality.  Patient was admitted for MRI evaluation in the morning.  Review of Systems: As per HPI all other systems reviewed and negative.  Past Medical History:  Diagnosis Date   AICD (automatic cardioverter/defibrillator) present    Alcoholism in remission ([place])    Anemia    Aortic insufficiency    Arthritis    At moderate risk for fall    Bell's palsy    Chronic systolic heart failure ([place])    a. EF previously 15-20% with cath in 2014 showing  no significant CAD b. EF 20-25% by echo in 06/2018   CKD (chronic kidney disease), stage IV (HCC)    COPD (chronic obstructive pulmonary disease) ([place])    Essential hypertension, benign    HIV disease ([place]) 09/06/2016   Hyperlipidemia    NICM (nonischemic cardiomyopathy) ([place])    Noncompliance with medication regimen    NSVT (nonsustained ventricular tachycardia) (HCC)    Numbness of right jaw    Had a stoke in 01/2013. Numbness is occasional, especially when trying to chew.   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 ([place]) 07/31/2018   Uses hearing aid 2014   recently received new hearing aids    Past Surgical History:  Procedure Laterality Date   AV FISTULA PLACEMENT Left 07/20/2018   Procedure: INSERTION ARTERIOVENOUS GORTEX GRAFT  LEFT ARM;  Surgeon: Rosetta Posner, MD;  Location: [place];  Service: Vascular;  Laterality: Left;   BIOPSY N/A 04/10/2014   Procedure: GASTRIC BIOPSY;  Surgeon: Daneil Dolin, MD;  Location: [facility];  Service: Endoscopy;  Laterality: N/A;   BIOPSY  10/12/2015   Procedure: BIOPSY;  Surgeon: Daneil Dolin, MD;  [facility];  Service: Endoscopy;;  stomach  bx's   CARDIAC DEFIBRILLATOR PLACEMENT  11/12/12   [employer] Inogen MINI ICD implanted in [place] at [place] AV[facility] per Dr Nelly Laurence note   COLONOSCOPY WITH PROPOFOL N/A 04/10/2014   RMR: Colonic Diverticulosis   ESOPHAGOGASTRODUODENOSCOPY (EGD) WITH PROPOFOL N/A 04/10/2014   RMR: Mild erosive reflux esophagitis. Multiple antral polyps likely hyperplastic status post removal by hot snare cautery technique. Diffusely abnormal stomach status post gastric  biopsy I suspect some of patients anemaia may be due to intermittent oozing from the stomach. It would be difficult and a risky proposition to attempt complete removal of all of his gastric polyps.   ESOPHAGOGASTRODUODENOSCOPY (EGD) WITH PROPOFOL N/A 10/12/2015   Procedure: ESOPHAGOGASTRODUODENOSCOPY (EGD) WITH PROPOFOL;  Surgeon: Daneil Dolin, MD;  [facility];  Service: Endoscopy;  Laterality: N/A;  1829 - moved to [place] Right 07/20/2018   Procedure: EXCHANGE OF DIALYSIS CATHETER RIGHT INTERNAL JUGULAR;  Surgeon: Rosetta Posner, MD;  Location: [place];  Service: Vascular;  Laterality: Right;   IR FLUORO GUIDE CV LINE RIGHT  07/09/2018   IR US GUIDE VASC ACCESS RIGHT  07/09/2018   POLYPECTOMY N/A 04/10/2014   Procedure: GASTRIC POLYPECTOMY;  Surgeon: Daneil Dolin, MD;  Location[facility];  Service: Endoscopy;  Laterality: N/A;   RIGHT HEART CATHETERIZATION N/A 02/06/2014   Procedure: RIGHT HEART CATH;  Surgeon: Larey Dresser, MD;  Location: [facility] CATH LAB;  Service: Cardiovascular;  Laterality: N/A;     reports that he quit smoking about 25 years ago. His smoking use included cigarettes. He has never used smokeless tobacco. He reports previous alcohol use. He reports that he does not use drugs.  No Known Allergies  Family History  Problem Relation Age of Onset   Kidney disease Mother    Kidney disease Sister    Colon cancer Neg Hx     Acceptable: Family history reviewed and not pertinent (If you reviewed it)  Prior to Admission medications   Medication Sig Start Date End Date Taking? Authorizing Provider  atorvastatin (LIPITOR) 80 MG tablet Take 80 mg by mouth daily.  11/22/16  Yes [provider]  bictegravir-emtricitabine-tenofovir AF (BIKTARVY) 50-200-25 MG TABS tablet Take 1 tablet by mouth daily. 04/18/18  Yes Comer, Okey Regal, MD  calcium carbonate (TUMS EX) 750 MG chewable tablet Chew 1 tablet by mouth 2 (two) times daily.  07/30/18  Yes [provider]  Cholecalciferol (VITAMIN D3) 2000 UNITS TABS Take 2,000 mg by mouth daily.   Yes [provider]  docusate sodium (COLACE) 100 MG capsule Take 100 mg by mouth daily.   Yes [provider]  Fluticasone-Salmeterol (ADVAIR) 100-50 MCG/DOSE AEPB Inhale 1 puff into the lungs daily. 12/04/18  Yes [provider]  metoprolol succinate (TOPROL-XL) 25 MG 24 hr tablet Take 1 tablet (25 mg total) by mouth daily. 08/08/18  Yes Strader, [place] M, PA-C  midodrine (PROAMATINE) 10 MG tablet Take 10 mg by mouth. Send medication with resident to be given at center. Once a day on Tues.,Thurs., Sat.   Yes [provider]  multivitamin (RENA-VIT) TABS tablet Take 1 tablet by mouth at bedtime. 07/26/18  Yes Ghimire, Henreitta Leber, MD  omeprazole (PRILOSEC) 20 MG capsule Take 20 mg by mouth at bedtime. 09/04/18  Yes [provider]  senna-docusate (SENEXON-S) 8.6-50 MG tablet Take 1 tablet by mouth daily.   Yes [provider]  tamsulosin (  FLOMAX) 0.4 MG CAPS capsule Take 0.4 mg by mouth daily.   Yes [provider]  warfarin (COUMADIN) 1 MG tablet Take 1 mg by mouth daily. Give along with 2.5 mg tab for total of 3.5 daily   Yes [provider]  warfarin (COUMADIN) 2.5 MG tablet Take 2.5 mg by mouth daily. Give along with 1 mg tab for total of 3.5 mg daily   Yes [provider]  acetaminophen (TYLENOL) 325 MG tablet Take 2 tablets (650 mg total) by mouth every 4 (four) hours as needed for mild pain (or temp > 37.5 C (99.5 F)). 06/30/16   Sinda Du, MD  albuterol (PROVENTIL) (2.5 MG/3ML) 0.083% nebulizer solution Take 3 mLs (2.5 mg total) by nebulization every 4 (four) hours as needed for wheezing or shortness of breath. 07/26/18   Ghimire, Henreitta Leber, MD  coal tar (NEUTROGENA T-GEL) 0.5 % shampoo Apply to scalp on shower days (Twice a week) for dandruff and dry scalp Wed., and Sat.    [provider]    NON FORMULARY Diet Type:  Dysphagia 2 with thin liquids (NO Straws) 11/19/18   [provider]  Nutritional Supplements (FEEDING SUPPLEMENT, NEPRO CARB STEADY,) LIQD Take by mouth daily. 11/20/18   [provider]  UNABLE TO FIND FLUID RESTRICTION 1200 CC/ 24hrs. Breakfast (12oz) 360 ml lunch (8oz) 240 ml Dinner (12oz) 360 ml Every Shift Day, Evening, Night 09/06/18   [provider]  ZINC OXIDE EX Apply Zinc oxide to bilateral buttocks area for irritation. Every Shift Day, Day, Day    [provider]    Physical Exam: Vitals:   03/02/19 1930 03/02/19 1945 03/02/19 2000 03/02/19 2130  BP: 96/80 103/77 102/86 121/79  Pulse: 86 87 88 88  Resp: 14 14 14 20   Temp:      TempSrc:      SpO2: 98% 99% 93% 93%  Weight:      Height:        Constitutional: NAD, calm, comfortable Vitals:   03/02/19 1930 03/02/19 1945 03/02/19 2000 03/02/19 2130  BP: 96/80 103/77 102/86 121/79  Pulse: 86 87 88 88  Resp: 14 14 14 20   Temp:      TempSrc:      SpO2: 98% 99% 93% 93%  Weight:      Height:       Eyes: PERRL, lids and conjunctivae normal ENMT: Mucous membranes are moist. Posterior pharynx clear of any exudate or lesions.Normal dentition.  Neck: normal, supple, no masses, no thyromegaly Respiratory: clear to auscultation bilaterally, no wheezing, no crackles. Normal respiratory effort. No accessory muscle use.  Cardiovascular: Regular rate and rhythm, no murmurs / rubs / gallops. No extremity edema. 2+ pedal pulses.   Abdomen: no tenderness, no masses palpated. No hepatosplenomegaly. Bowel sounds positive.  Musculoskeletal: no clubbing / cyanosis. No joint deformity upper and lower extremities. Good ROM, no contractures. Normal muscle tone.  Skin: no rashes, lesions, ulcers. No induration Neurologic: Left-sided facial droop with left lower extremity left upper extremity weakness.  Strength 3/5 in left upper and lower extremity.  Dysarthric speech with  nasolabial prominence on left.  Left-sided weakness chronic as per documentation on 07/12/2018. Psychiatric: Normal judgment and insight. Alert and oriented x 3. Normal mood.   Labs on Admission: I have personally reviewed following labs and imaging studies  CBC: Recent Labs  Lab 03/02/19 1534  WBC 2.4*  NEUTROABS 1.6*  HGB 12.4*  HCT 36.3*  MCV 92.6  PLT 79*  Basic Metabolic Panel: Recent Labs  Lab 03/02/19 1534  NA 136  K 3.1*  CL 98  CO2 26  GLUCOSE 106*  BUN 19  CREATININE 2.53*  CALCIUM 8.3*   GFR: Estimated Creatinine Clearance: 22.8 mL/min (A) (by C-G formula based on SCr of 2.53 mg/dL (H)). Liver Function Tests: Recent Labs  Lab 03/02/19 1534  AST 47*  ALT 45*  ALKPHOS 161*  BILITOT 1.8*  PROT 7.0  ALBUMIN 2.9*   No results for input(s): LIPASE, AMYLASE in the last 168 hours. No results for input(s): AMMONIA in the last 168 hours. Coagulation Profile: Recent Labs  Lab 02/26/19 0400 03/02/19 1534  INR 1.9* 2.0*   Cardiac Enzymes: No results for input(s): CKTOTAL, CKMB, CKMBINDEX, TROPONINI in the last 168 hours. BNP (last 3 results) No results for input(s): PROBNP in the last 8760 hours. HbA1C: No results for input(s): HGBA1C in the last 72 hours. CBG: Recent Labs  Lab 03/02/19 1548  GLUCAP 100*   Lipid Profile: No results for input(s): CHOL, HDL, LDLCALC, TRIG, CHOLHDL, LDLDIRECT in the last 72 hours. Thyroid Function Tests: No results for input(s): TSH, T4TOTAL, FREET4, T3FREE, THYROIDAB in the last 72 hours. Anemia Panel: No results for input(s): VITAMINB12, FOLATE, FERRITIN, TIBC, IRON, RETICCTPCT in the last 72 hours. Urine analysis:    Component Value Date/Time   COLORURINE AMBER (A) 07/08/2018 0839   APPEARANCEUR CLOUDY (A) 07/08/2018 0839   LABSPEC 1.016 07/08/2018 0839   PHURINE 5.0 07/08/2018 0839   GLUCOSEU NEGATIVE 07/08/2018 0839   HGBUR SMALL (A) 07/08/2018 0839   BILIRUBINUR NEGATIVE 07/08/2018 0839   KETONESUR 5 (A)  07/08/2018 0839   PROTEINUR 100 (A) 07/08/2018 0839   UROBILINOGEN 0.2 10/23/2013 1545   NITRITE NEGATIVE 07/08/2018 0839   LEUKOCYTESUR LARGE (A) 07/08/2018 0839    Radiological Exams on Admission: Ct Angio Head W Or Wo Contrast  Result Date: 03/02/2019 CLINICAL DATA:  Focal neuro deficit, less than 6 hours, stroke suspected. Additional history provided: Patient found unresponsive in wheelchair after returning from dialysis. Left-sided facial droop and slurred speech. Left grip weakness. EXAM: CT ANGIOGRAPHY HEAD AND NECK TECHNIQUE: Multidetector CT imaging of the head and neck was performed using the standard protocol during bolus administration of intravenous contrast. Multiplanar CT image reconstructions and MIPs were obtained to evaluate the vascular anatomy. Carotid stenosis measurements (when applicable) are obtained utilizing NASCET criteria, using the distal internal carotid diameter as the denominator. CONTRAST:  41mL OMNIPAQUE IOHEXOL 350 MG/ML SOLN COMPARISON:  None. Noncontrast head CT 03/02/2019, carotid artery duplex 06/27/2016 FINDINGS: CTA NECK FINDINGS Aortic arch: Standard branching. Imaged portions of the aortic arch demonstrate no evidence of dissection or aneurysm. Atherosclerotic calcification within the visualized aortic arch and proximal major branch vessels of the neck. Right carotid system: CCA and ICA patent within the neck without significant stenosis (50% or greater). Atherosclerosis at the carotid bifurcation and within the proximal right ICA with less than 50% luminal narrowing of the proximal ICA. Distal to this, the ICA is patent within the neck without significant stenosis. Left carotid system: Scattered plaque within the common carotid artery without significant stenosis (50% or greater). Prominent plaque at the carotid bifurcation and within the proximal ICA. Approximately 60-70% stenosis within the proximal ICA. Distal to this, the ICAs patent within the neck without  significant stenosis. Vertebral arteries: The right vertebral artery is patent within the neck without significant stenosis (50% or greater. Plaque at the origin of the dominant left vertebral artery results in mild-to-moderate  ostial stenosis. Distal to this, the left vertebral artery is patent within the neck without significant stenosis. Skeleton: Reversal of the expected cervical lordosis. Cervical spondylosis greatest at C6-C7. Diffusely heterogeneous appearance of the osseous structures likely reflecting renal osteodystrophy. Other neck: Generalized edema consistent with anasarca. Upper chest: Partially imaged bilateral pleural effusions. The right pleural effusion may be loculated. Sequela of prior median sternotomy. Partially visualized pacer leads. Review of the MIP images confirms the above findings CTA HEAD FINDINGS Anterior circulation: Atherosclerotic calcification within the intracranial internal carotid arteries bilaterally with regions of mild luminal stenosis. The right middle and anterior cerebral arteries are patent without significant proximal stenosis. The left middle and anterior cerebral arteries are patent without significant proximal stenosis. Posterior circulation: The intracranial vertebral arteries are patent without significant stenosis. The basilar artery is patent without significant proximal stenosis. The right posterior cerebral artery is patent. Mild focal stenosis within the right PCA at the P1-P2 junction. Moderate focal stenosis within the P2 left PCA. Venous sinuses: Within limitations of contrast timing, no convincing thrombus. Anatomic variants: None significant Review of the MIP images confirms the above findings These results were called by telephone at the time of interpretation on 03/02/2019 at 5:19 pm to provider [facility] , who verbally acknowledged these results. IMPRESSION: CTA head: 1. No intracranial large vessel occlusion. 2. Moderate focal stenosis within the P2  left PCA. 3. Mild focal stenosis within the right PCA at the P1-P2 junction. 4. Additional intracranial atherosclerotic disease without significant stenosis as described. CTA neck: 1. Atherosclerotic disease within the imaged aortic arch and major branch vessels of the neck as detailed and most notably as follows. 2. Approximately 60-70% stenosis of the proximal left ICA. 3. Less than 50% stenosis of the proximal right ICA. 4. Mild/moderate ostial stenosis of the dominant left vertebral artery. The bilateral vertebral arteries are otherwise patent within the neck without significant stenosis. 5. Anasarca. Partially visualized bilateral pleural effusions. The right pleural effusion may be loculated. 6. Sequela of renal osteodystrophy. Electronically Signed   By: Kellie Simmering   On: 03/02/2019 17:19   Ct Angio Neck W And/or Wo Contrast  Result Date: 03/02/2019 CLINICAL DATA:  Focal neuro deficit, less than 6 hours, stroke suspected. Additional history provided: Patient found unresponsive in wheelchair after returning from dialysis. Left-sided facial droop and slurred speech. Left grip weakness. EXAM: CT ANGIOGRAPHY HEAD AND NECK TECHNIQUE: Multidetector CT imaging of the head and neck was performed using the standard protocol during bolus administration of intravenous contrast. Multiplanar CT image reconstructions and MIPs were obtained to evaluate the vascular anatomy. Carotid stenosis measurements (when applicable) are obtained utilizing NASCET criteria, using the distal internal carotid diameter as the denominator. CONTRAST:  29mL OMNIPAQUE IOHEXOL 350 MG/ML SOLN COMPARISON:  None. Noncontrast head CT 03/02/2019, carotid artery duplex 06/27/2016 FINDINGS: CTA NECK FINDINGS Aortic arch: Standard branching. Imaged portions of the aortic arch demonstrate no evidence of dissection or aneurysm. Atherosclerotic calcification within the visualized aortic arch and proximal major branch vessels of the neck. Right carotid  system: CCA and ICA patent within the neck without significant stenosis (50% or greater). Atherosclerosis at the carotid bifurcation and within the proximal right ICA with less than 50% luminal narrowing of the proximal ICA. Distal to this, the ICA is patent within the neck without significant stenosis. Left carotid system: Scattered plaque within the common carotid artery without significant stenosis (50% or greater). Prominent plaque at the carotid bifurcation and within the proximal ICA. Approximately 60-70% stenosis  within the proximal ICA. Distal to this, the ICAs patent within the neck without significant stenosis. Vertebral arteries: The right vertebral artery is patent within the neck without significant stenosis (50% or greater. Plaque at the origin of the dominant left vertebral artery results in mild-to-moderate ostial stenosis. Distal to this, the left vertebral artery is patent within the neck without significant stenosis. Skeleton: Reversal of the expected cervical lordosis. Cervical spondylosis greatest at C6-C7. Diffusely heterogeneous appearance of the osseous structures likely reflecting renal osteodystrophy. Other neck: Generalized edema consistent with anasarca. Upper chest: Partially imaged bilateral pleural effusions. The right pleural effusion may be loculated. Sequela of prior median sternotomy. Partially visualized pacer leads. Review of the MIP images confirms the above findings CTA HEAD FINDINGS Anterior circulation: Atherosclerotic calcification within the intracranial internal carotid arteries bilaterally with regions of mild luminal stenosis. The right middle and anterior cerebral arteries are patent without significant proximal stenosis. The left middle and anterior cerebral arteries are patent without significant proximal stenosis. Posterior circulation: The intracranial vertebral arteries are patent without significant stenosis. The basilar artery is patent without significant  proximal stenosis. The right posterior cerebral artery is patent. Mild focal stenosis within the right PCA at the P1-P2 junction. Moderate focal stenosis within the P2 left PCA. Venous sinuses: Within limitations of contrast timing, no convincing thrombus. Anatomic variants: None significant Review of the MIP images confirms the above findings These results were called by telephone at the time of interpretation on 03/02/2019 at 5:19 pm to provider [facility] , who verbally acknowledged these results. IMPRESSION: CTA head: 1. No intracranial large vessel occlusion. 2. Moderate focal stenosis within the P2 left PCA. 3. Mild focal stenosis within the right PCA at the P1-P2 junction. 4. Additional intracranial atherosclerotic disease without significant stenosis as described. CTA neck: 1. Atherosclerotic disease within the imaged aortic arch and major branch vessels of the neck as detailed and most notably as follows. 2. Approximately 60-70% stenosis of the proximal left ICA. 3. Less than 50% stenosis of the proximal right ICA. 4. Mild/moderate ostial stenosis of the dominant left vertebral artery. The bilateral vertebral arteries are otherwise patent within the neck without significant stenosis. 5. Anasarca. Partially visualized bilateral pleural effusions. The right pleural effusion may be loculated. 6. Sequela of renal osteodystrophy. Electronically Signed   By: Kellie Simmering   On: 03/02/2019 17:19   Ct Head Code Stroke Wo Contrast  Result Date: 03/02/2019 CLINICAL DATA:  Code stroke. 68 year old male found unresponsive after dialysis. Left side weakness and slurred speech. EXAM: CT HEAD WITHOUT CONTRAST TECHNIQUE: Contiguous axial images were obtained from the base of the skull through the vertex without intravenous contrast. COMPARISON:  Head CT 07/11/2018 and earlier. FINDINGS: Brain: Chronic encephalomalacia at the confluence of the right posterior temporal and occipital lobes. Superimposed confluent and  widespread bilateral cerebral white matter hypodensity. Superimposed chronic lacunar infarcts in the corona radiata and deep gray matter nuclei. Chronic lacunar infarct in the right paracentral pons. Small chronic cerebellar infarcts in both hemispheres. No midline shift, ventriculomegaly, mass effect, evidence of mass lesion, intracranial hemorrhage or evidence of cortically based acute infarction. Gray-white matter differentiation is within normal limits throughout the brain. Vascular: Calcified atherosclerosis at the skull base. No suspicious intracranial vascular hyperdensity. Skull: No acute osseous abnormality identified. Sinuses/Orbits: Paranasal sinuses have cleared since February. Mastoids remain clear. Other: No acute orbit or scalp soft tissue finding. ASPECTS [facility] Stroke Program Early CT Score) Total score (0-10 with 10 being normal): 10 (chronic cortical  encephalomalacia on the right and stable appearing bilateral chronic small vessel disease. IMPRESSION: 1. No acute cortically based infarct or acute intracranial hemorrhage identified. 2. Stable non contrast CT appearance of advanced chronic ischemic disease since February. 3.  ASPECTS 10 (chronic encephalomalacia). Study discussed by telephone with Dr. Daleen Bo on 03/02/2019 at 15:46 . Electronically Signed   By: Genevie Ann M.D.   On: 03/02/2019 15:47    EKG: Independently reviewed. 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 Moderate focal stenosis within the P2 left PCA.3. Mild focal stenosis within the right PCA at the P1-P2 junction.4. Additional intracranial atherosclerotic disease without significant stenosis as described. CTA neck:. Approximately 60-70% stenosis of the proximal left ICA. 3. Less than 50% stenosis of the proximal right ICA.4. Mild/moderate ostial stenosis of the dominant left vertebral artery. The bilateral vertebral arteries are otherwise patent within the neck without significant stenosis.  -Tele neurology was consulted,  as there was no large vessel occlusion and he was anticoagulated on warfarin decision to give TPA was not recommended. -There was resolution of symptoms on the floor,  admitted for possible TIA. Patient has left-sided weakness residual from the prior stroke Its  unclear if the current presentation is new versus old.  Possibility of seizure is high as patient was at one point of time consented for Keppra due to concern of seizure documented on 07/12/2018 by neurology. -We will get MRI in the morning -Consult neurology post MRI -Continue to monitor patient with neuro checks every 4 hourly -Speech and swallow eval in the morning -c/w holding coumadin as per neurology.    Essential hypertension, benign : Blood pressure stable is on metoprolol 25 mg daily and Middaugh drains on Tuesday Thursday Saturday on his dialysis days.  As patient blood pressure has been on the low normal side will need adjustment in metoprolol dose prior to discharge.   CAD (coronary artery disease) : Stable with no chest pain continue with beta-blockers and statin    ICD (implantable cardioverter-defibrillator) in place  : An ICM status post ICD placement in 2014.    PAF (paroxysmal atrial fibrillation) : Patient on Coumadin for bioprosthetic AVR with elevated chads vASC score would also address atrial fibrillation stroke prevention.  HIV disease : On HAART therapy would continue the same    COPD (chronic obstructive pulmonary disease) : Not in exacerbation patient continue with home bronchodilators as needed.  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  Continue to monitor blood glucose would manage with insulin for target blood glucose less than 180 mg/dL.  ESRD (end stage renal disease) on dialysis  : Patient in  euvolemic state lites within normal limit is on dialysis Tuesday Thursday and Saturday.  Due for dialysis on Tuesday no indication for urgent dialysis    Chronic anticoagulation : He is on Coumadin, holding it as per telemetry neurology recommendation will resume after MRI  DVT prophylaxis: On Coumadin   Code Status:  Full code  Family Communication: None  Disposition Plan: home  (specify when and where you expect patient to be discharged) Consults called: [place] neurology   Admission status: Tele Inpat   I certify that at the point of admission it is my clinical judgment that the patient will require inpatient hospital care spanning beyond 2 midnights from the point of admission due to high intensity of service, high risk for further deterioration and high frequency of surveillance required. The following factors support the patient status of inpatient:    Oran Rein MD [facility]  03/02/2019, 9:52 PM

## 2019-03-02 NOTE — ED Notes (Signed)
Dr Eulis Foster at bedside

## 2019-03-02 NOTE — ED Provider Notes (Addendum)
[facility] EMERGENCY DEPARTMENT Provider Note   CSN: [id number] Arrival date & time: 03/02/19  1526  An emergency department physician performed an initial assessment on this suspected stroke patient at 55.  History   Chief Complaint Chief Complaint  Patient presents with   Aphasia    HPI Keith Young is a 68 y.o. male.     HPI   He is an anticoagulated patient who is presenting for evaluation of suspected stroke, found unresponsive at his facility.  Reportedly onset of symptoms at 2:58 PM today.  He had just returned from hemodialysis.  Shortly after he was discovered to be unresponsive, and EMS was summoned.  Patient has improved since the transport started.  He is able to converse, but cannot give details on the incident today.  Level 5 caveat-altered mental status  Past Medical History:  Diagnosis Date   AICD (automatic cardioverter/defibrillator) present    Alcoholism in remission ([place])    Anemia    Aortic insufficiency    Arthritis    At moderate risk for fall    Bell's palsy    Chronic systolic heart failure ([place])    a. EF previously 15-20% with cath in 2014 showing no significant CAD b. EF 20-25% by echo in 06/2018   CKD (chronic kidney disease), stage IV (HCC)    COPD (chronic obstructive pulmonary disease) ([place])    Essential hypertension, benign    HIV disease ([place]) 09/06/2016   Hyperlipidemia    NICM (nonischemic cardiomyopathy) ([place])    Noncompliance with medication regimen    NSVT (nonsustained ventricular tachycardia) (HCC)    Numbness of right jaw    Had a stoke in 01/2013. Numbness is occasional, especially when trying to chew.   OSA (obstructive sleep apnea)    Productive cough 08/2013   With brown sputum    S/P aortic valve replacement with bioprosthetic valve    Stroke ([place]) 01/2013   weakness of right side from CVA   Type 2 diabetes mellitus (HCC)    Type 2 diabetes mellitus with hypertension and end stage renal  disease on dialysis ([place]) 07/31/2018   Uses hearing aid 2014   recently received new hearing aids    Patient Active Problem List   Diagnosis Date Noted   Vascular dementia without behavioral disturbance ([place]) 02/09/2019   Chronic anticoagulation 12/11/2018   Hypertension associated with stage 5 chronic kidney disease due to type 2 diabetes mellitus ([place]) 12/04/2018   S/P AVR (aortic valve replacement) 12/04/2018   Anemia due to end stage renal disease ([place]) 10/02/2018   ESRD (end stage renal disease) on dialysis ([place]) 08/06/2018   PAF (paroxysmal atrial fibrillation) ([place]) 07/31/2018   Dyslipidemia associated with type 2 diabetes mellitus ([place]) 07/31/2018   Type 2 diabetes mellitus with hypertension and end stage renal disease on dialysis ([place]) 07/31/2018   Chronic kidney disease with end stage renal disease on dialysis due to type 2 diabetes mellitus ([place]) 07/31/2018   Dialysis patient ([place]) 07/31/2018   BPH with obstruction/lower urinary tract symptoms 07/31/2018   GERD without esophagitis 07/31/2018   Chronic constipation 07/31/2018   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 07/31/2018   Malnutrition of moderate degree 07/20/2018   Diverticulosis    Tachycardia    E coli bacteremia    Septic shock ([place]) 07/07/2018   Chronic combined systolic and diastolic heart failure ([place]) 12/09/2016   COPD (chronic obstructive pulmonary disease) ([place]) 09/12/2016   Protein-calorie malnutrition, severe 09/06/2016   HIV disease ([place]) 09/06/2016  CVA (cerebral vascular accident) ([place]) 06/26/2016   ICD (implantable cardioverter-defibrillator) in place 01/22/2015   Anemia in chronic kidney disease 03/26/2014   Carotid bruit 12/18/2013   Essential hypertension, benign 10/23/2013   CAD (coronary artery disease) 10/23/2013    Past Surgical History:  Procedure Laterality Date   AV FISTULA PLACEMENT Left 07/20/2018   Procedure: INSERTION ARTERIOVENOUS GORTEX GRAFT  LEFT ARM;   Surgeon: Rosetta Posner, MD;  Location: [place];  Service: Vascular;  Laterality: Left;   BIOPSY N/A 04/10/2014   Procedure: GASTRIC BIOPSY;  Surgeon: Daneil Dolin, MD;  Location: [facility];  Service: Endoscopy;  Laterality: N/A;   BIOPSY  10/12/2015   Procedure: BIOPSY;  Surgeon: Daneil Dolin, MD;  [facility];  Service: Endoscopy;;  stomach bx's   CARDIAC DEFIBRILLATOR PLACEMENT  11/12/12   [employer] Inogen MINI ICD implanted in [place] at [facility] AV[facility] per Dr Nelly Laurence note   COLONOSCOPY WITH PROPOFOL N/A 04/10/2014   RMR: Colonic Diverticulosis   ESOPHAGOGASTRODUODENOSCOPY (EGD) WITH PROPOFOL N/A 04/10/2014   RMR: Mild erosive reflux esophagitis. Multiple antral polyps likely hyperplastic status post removal by hot snare cautery technique. Diffusely abnormal stomach status post gastric biopsy I suspect some of patients anemaia may be due to intermittent oozing from the stomach. It would be difficult and a risky proposition to attempt complete removal of all of his gastric polyps.   ESOPHAGOGASTRODUODENOSCOPY (EGD) WITH PROPOFOL N/A 10/12/2015   Procedure: ESOPHAGOGASTRODUODENOSCOPY (EGD) WITH PROPOFOL;  Surgeon: Daneil Dolin, MD;  [facility];  Service: Endoscopy;  Laterality: N/A;  3419 - moved to [place] Right 07/20/2018   Procedure: EXCHANGE OF DIALYSIS CATHETER RIGHT INTERNAL JUGULAR;  Surgeon: Rosetta Posner, MD;  Location: [place];  Service: Vascular;  Laterality: Right;   IR FLUORO GUIDE CV LINE RIGHT  07/09/2018   IR US GUIDE VASC ACCESS RIGHT  07/09/2018   POLYPECTOMY N/A 04/10/2014   Procedure: GASTRIC POLYPECTOMY;  Surgeon: Daneil Dolin, MD;  Location: [facility];  Service: Endoscopy;  Laterality: N/A;   RIGHT HEART CATHETERIZATION N/A 02/06/2014   Procedure: RIGHT HEART CATH;  Surgeon: Larey Dresser, MD;  Location: [facility] CATH LAB;  Service: Cardiovascular;  Laterality:  N/A;        Home Medications    Prior to Admission medications   Medication Sig Start Date End Date Taking? Authorizing Provider  atorvastatin (LIPITOR) 80 MG tablet Take 80 mg by mouth daily.  11/22/16  Yes [provider]  bictegravir-emtricitabine-tenofovir AF (BIKTARVY) 50-200-25 MG TABS tablet Take 1 tablet by mouth daily. 04/18/18  Yes Comer, Okey Regal, MD  calcium carbonate (TUMS EX) 750 MG chewable tablet Chew 1 tablet by mouth 2 (two) times daily. 07/30/18  Yes [provider]  Cholecalciferol (VITAMIN D3) 2000 UNITS TABS Take 2,000 mg by mouth daily.   Yes [provider]  docusate sodium (COLACE) 100 MG capsule Take 100 mg by mouth daily.   Yes [provider]  Fluticasone-Salmeterol (ADVAIR) 100-50 MCG/DOSE AEPB Inhale 1 puff into the lungs daily. 12/04/18  Yes [provider]  metoprolol succinate (TOPROL-XL) 25 MG 24 hr tablet Take 1 tablet (25 mg total) by mouth daily. 08/08/18  Yes Strader, [place] M, PA-C  midodrine (PROAMATINE) 10 MG tablet Take 10 mg by mouth. Send medication with resident to be given at center. Once a day on Tues.,Thurs., Sat.  Yes [provider]  multivitamin (RENA-VIT) TABS tablet Take 1 tablet by mouth at bedtime. 07/26/18  Yes Ghimire, Henreitta Leber, MD  omeprazole (PRILOSEC) 20 MG capsule Take 20 mg by mouth at bedtime. 09/04/18  Yes [provider]  senna-docusate (SENEXON-S) 8.6-50 MG tablet Take 1 tablet by mouth daily.   Yes [provider]  tamsulosin (FLOMAX) 0.4 MG CAPS capsule Take 0.4 mg by mouth daily.   Yes [provider]  warfarin (COUMADIN) 1 MG tablet Take 1 mg by mouth daily. Give along with 2.5 mg tab for total of 3.5 daily   Yes [provider]  warfarin (COUMADIN) 2.5 MG tablet Take 2.5 mg by mouth daily. Give along with 1 mg tab for total of 3.5 mg daily   Yes [provider]  acetaminophen (TYLENOL) 325 MG tablet Take 2 tablets (650 mg total)  by mouth every 4 (four) hours as needed for mild pain (or temp > 37.5 C (99.5 F)). 06/30/16   Sinda Du, MD  albuterol (PROVENTIL) (2.5 MG/3ML) 0.083% nebulizer solution Take 3 mLs (2.5 mg total) by nebulization every 4 (four) hours as needed for wheezing or shortness of breath. 07/26/18   Ghimire, Henreitta Leber, MD  coal tar (NEUTROGENA T-GEL) 0.5 % shampoo Apply to scalp on shower days (Twice a week) for dandruff and dry scalp Wed., and Sat.    [provider]  NON FORMULARY Diet Type:  Dysphagia 2 with thin liquids (NO Straws) 11/19/18   [provider]  Nutritional Supplements (FEEDING SUPPLEMENT, NEPRO CARB STEADY,) LIQD Take by mouth daily. 11/20/18   [provider]  UNABLE TO FIND FLUID RESTRICTION 1200 CC/ 24hrs. Breakfast (12oz) 360 ml lunch (8oz) 240 ml Dinner (12oz) 360 ml Every Shift Day, Evening, Night 09/06/18   [provider]  ZINC OXIDE EX Apply Zinc oxide to bilateral buttocks area for irritation. Every Shift Day, Day, Day    [provider]    Family History Family History  Problem Relation Age of Onset   Kidney disease Mother    Kidney disease Sister    Colon cancer Neg Hx     Social History Social History   Tobacco Use   Smoking status: Former Smoker    Types: Cigarettes    Quit date: 11/05/1993    Years since quitting: 25.3   Smokeless tobacco: Never Used  Substance Use Topics   Alcohol use: Not Currently    Comment: former heavy ETOH, none recently   Drug use: No    Comment: prior history of cocaine use, smoking      Allergies   Patient has no known allergies.   Review of Systems Review of Systems  Unable to perform ROS: Mental status change     Physical Exam Updated Vital Signs BP 102/86    Pulse 88    Temp 97.9 F (36.6 C) (Oral)    Resp 14    Ht 5\' 3"  (1.6 m)    Wt 68 kg    SpO2 93%    BMI 26.55 kg/m   Physical Exam Vitals signs and nursing note reviewed.  Constitutional:      General:  He is in acute distress.     Appearance: He is well-developed. He is ill-appearing. He is not toxic-appearing or diaphoretic.  HENT:     Head: Normocephalic and atraumatic.     Right Ear: External ear normal.     Left Ear: External ear normal.  Eyes:  Conjunctiva/sclera: Conjunctivae normal.     Pupils: Pupils are equal, round, and reactive to light.  Neck:     Musculoskeletal: Normal range of motion and neck supple.     Trachea: Phonation normal.  Cardiovascular:     Rate and Rhythm: Normal rate and regular rhythm.     Heart sounds: Normal heart sounds.  Pulmonary:     Effort: Pulmonary effort is normal.     Breath sounds: Normal breath sounds.  Abdominal:     General: There is distension.     Palpations: Abdomen is soft.     Tenderness: There is no abdominal tenderness.  Musculoskeletal: Normal range of motion.  Skin:    General: Skin is warm and dry.  Neurological:     Mental Status: He is alert.     Sensory: No sensory deficit.     Motor: No abnormal muscle tone.     Coordination: Coordination normal.     Comments: Dysarthria is present.  No aphasia.  Left facial droop present.  Left arm drift present.  Unable to independently lift left leg from stretcher, or hold it up when assisted.  Normal finger-to-nose bilaterally.  Psychiatric:        Mood and Affect: Mood normal.        Behavior: Behavior normal.      ED Treatments / Results  Labs (all labs ordered are listed, but only abnormal results are displayed) Labs Reviewed  PROTIME-INR - Abnormal; Notable for the following components:      Result Value   Prothrombin Time 22.2 (*)    INR 2.0 (*)    All other components within normal limits  APTT - Abnormal; Notable for the following components:   aPTT 42 (*)    All other components within normal limits  CBC - Abnormal; Notable for the following components:   WBC 2.4 (*)    RBC 3.92 (*)    Hemoglobin 12.4 (*)    HCT 36.3 (*)    RDW 20.4 (*)    Platelets 79  (*)    All other components within normal limits  DIFFERENTIAL - Abnormal; Notable for the following components:   Neutro Abs 1.6 (*)    Lymphs Abs 0.5 (*)    All other components within normal limits  COMPREHENSIVE METABOLIC PANEL - Abnormal; Notable for the following components:   Potassium 3.1 (*)    Glucose, Bld 106 (*)    Creatinine, Ser 2.53 (*)    Calcium 8.3 (*)    Albumin 2.9 (*)    AST 47 (*)    ALT 45 (*)    Alkaline Phosphatase 161 (*)    Total Bilirubin 1.8 (*)    GFR calc non Af Amer 25 (*)    GFR calc Af Amer 29 (*)    All other components within normal limits  CBG MONITORING, ED - Abnormal; Notable for the following components:   Glucose-Capillary 100 (*)    All other components within normal limits  SARS CORONAVIRUS 2 (TAT 6-24 HRS)  ETHANOL  RAPID URINE DRUG SCREEN, HOSP PERFORMED  URINALYSIS, ROUTINE W REFLEX MICROSCOPIC  I-STAT CHEM 8, ED    EKG None  Radiology Ct Angio Head W Or Wo Contrast  Result Date: 03/02/2019 CLINICAL DATA:  Focal neuro deficit, less than 6 hours, stroke suspected. Additional history provided: Patient found unresponsive in wheelchair after returning from dialysis. Left-sided facial droop and slurred speech. Left grip weakness. EXAM: CT ANGIOGRAPHY HEAD AND NECK TECHNIQUE: Multidetector  CT imaging of the head and neck was performed using the standard protocol during bolus administration of intravenous contrast. Multiplanar CT image reconstructions and MIPs were obtained to evaluate the vascular anatomy. Carotid stenosis measurements (when applicable) are obtained utilizing NASCET criteria, using the distal internal carotid diameter as the denominator. CONTRAST:  66mL OMNIPAQUE IOHEXOL 350 MG/ML SOLN COMPARISON:  None. Noncontrast head CT 03/02/2019, carotid artery duplex 06/27/2016 FINDINGS: CTA NECK FINDINGS Aortic arch: Standard branching. Imaged portions of the aortic arch demonstrate no evidence of dissection or aneurysm.  Atherosclerotic calcification within the visualized aortic arch and proximal major branch vessels of the neck. Right carotid system: CCA and ICA patent within the neck without significant stenosis (50% or greater). Atherosclerosis at the carotid bifurcation and within the proximal right ICA with less than 50% luminal narrowing of the proximal ICA. Distal to this, the ICA is patent within the neck without significant stenosis. Left carotid system: Scattered plaque within the common carotid artery without significant stenosis (50% or greater). Prominent plaque at the carotid bifurcation and within the proximal ICA. Approximately 60-70% stenosis within the proximal ICA. Distal to this, the ICAs patent within the neck without significant stenosis. Vertebral arteries: The right vertebral artery is patent within the neck without significant stenosis (50% or greater. Plaque at the origin of the dominant left vertebral artery results in mild-to-moderate ostial stenosis. Distal to this, the left vertebral artery is patent within the neck without significant stenosis. Skeleton: Reversal of the expected cervical lordosis. Cervical spondylosis greatest at C6-C7. Diffusely heterogeneous appearance of the osseous structures likely reflecting renal osteodystrophy. Other neck: Generalized edema consistent with anasarca. Upper chest: Partially imaged bilateral pleural effusions. The right pleural effusion may be loculated. Sequela of prior median sternotomy. Partially visualized pacer leads. Review of the MIP images confirms the above findings CTA HEAD FINDINGS Anterior circulation: Atherosclerotic calcification within the intracranial internal carotid arteries bilaterally with regions of mild luminal stenosis. The right middle and anterior cerebral arteries are patent without significant proximal stenosis. The left middle and anterior cerebral arteries are patent without significant proximal stenosis. Posterior circulation: The  intracranial vertebral arteries are patent without significant stenosis. The basilar artery is patent without significant proximal stenosis. The right posterior cerebral artery is patent. Mild focal stenosis within the right PCA at the P1-P2 junction. Moderate focal stenosis within the P2 left PCA. Venous sinuses: Within limitations of contrast timing, no convincing thrombus. Anatomic variants: None significant Review of the MIP images confirms the above findings These results were called by telephone at the time of interpretation on 03/02/2019 at 5:19 pm to provider [facility] , who verbally acknowledged these results. IMPRESSION: CTA head: 1. No intracranial large vessel occlusion. 2. Moderate focal stenosis within the P2 left PCA. 3. Mild focal stenosis within the right PCA at the P1-P2 junction. 4. Additional intracranial atherosclerotic disease without significant stenosis as described. CTA neck: 1. Atherosclerotic disease within the imaged aortic arch and major branch vessels of the neck as detailed and most notably as follows. 2. Approximately 60-70% stenosis of the proximal left ICA. 3. Less than 50% stenosis of the proximal right ICA. 4. Mild/moderate ostial stenosis of the dominant left vertebral artery. The bilateral vertebral arteries are otherwise patent within the neck without significant stenosis. 5. Anasarca. Partially visualized bilateral pleural effusions. The right pleural effusion may be loculated. 6. Sequela of renal osteodystrophy. Electronically Signed   By: Kellie Simmering   On: 03/02/2019 17:19   Ct Angio Neck W And/or Wo  Contrast  Result Date: 03/02/2019 CLINICAL DATA:  Focal neuro deficit, less than 6 hours, stroke suspected. Additional history provided: Patient found unresponsive in wheelchair after returning from dialysis. Left-sided facial droop and slurred speech. Left grip weakness. EXAM: CT ANGIOGRAPHY HEAD AND NECK TECHNIQUE: Multidetector CT imaging of the head and neck was  performed using the standard protocol during bolus administration of intravenous contrast. Multiplanar CT image reconstructions and MIPs were obtained to evaluate the vascular anatomy. Carotid stenosis measurements (when applicable) are obtained utilizing NASCET criteria, using the distal internal carotid diameter as the denominator. CONTRAST:  50mL OMNIPAQUE IOHEXOL 350 MG/ML SOLN COMPARISON:  None. Noncontrast head CT 03/02/2019, carotid artery duplex 06/27/2016 FINDINGS: CTA NECK FINDINGS Aortic arch: Standard branching. Imaged portions of the aortic arch demonstrate no evidence of dissection or aneurysm. Atherosclerotic calcification within the visualized aortic arch and proximal major branch vessels of the neck. Right carotid system: CCA and ICA patent within the neck without significant stenosis (50% or greater). Atherosclerosis at the carotid bifurcation and within the proximal right ICA with less than 50% luminal narrowing of the proximal ICA. Distal to this, the ICA is patent within the neck without significant stenosis. Left carotid system: Scattered plaque within the common carotid artery without significant stenosis (50% or greater). Prominent plaque at the carotid bifurcation and within the proximal ICA. Approximately 60-70% stenosis within the proximal ICA. Distal to this, the ICAs patent within the neck without significant stenosis. Vertebral arteries: The right vertebral artery is patent within the neck without significant stenosis (50% or greater. Plaque at the origin of the dominant left vertebral artery results in mild-to-moderate ostial stenosis. Distal to this, the left vertebral artery is patent within the neck without significant stenosis. Skeleton: Reversal of the expected cervical lordosis. Cervical spondylosis greatest at C6-C7. Diffusely heterogeneous appearance of the osseous structures likely reflecting renal osteodystrophy. Other neck: Generalized edema consistent with anasarca. Upper  chest: Partially imaged bilateral pleural effusions. The right pleural effusion may be loculated. Sequela of prior median sternotomy. Partially visualized pacer leads. Review of the MIP images confirms the above findings CTA HEAD FINDINGS Anterior circulation: Atherosclerotic calcification within the intracranial internal carotid arteries bilaterally with regions of mild luminal stenosis. The right middle and anterior cerebral arteries are patent without significant proximal stenosis. The left middle and anterior cerebral arteries are patent without significant proximal stenosis. Posterior circulation: The intracranial vertebral arteries are patent without significant stenosis. The basilar artery is patent without significant proximal stenosis. The right posterior cerebral artery is patent. Mild focal stenosis within the right PCA at the P1-P2 junction. Moderate focal stenosis within the P2 left PCA. Venous sinuses: Within limitations of contrast timing, no convincing thrombus. Anatomic variants: None significant Review of the MIP images confirms the above findings These results were called by telephone at the time of interpretation on 03/02/2019 at 5:19 pm to provider [facility] , who verbally acknowledged these results. IMPRESSION: CTA head: 1. No intracranial large vessel occlusion. 2. Moderate focal stenosis within the P2 left PCA. 3. Mild focal stenosis within the right PCA at the P1-P2 junction. 4. Additional intracranial atherosclerotic disease without significant stenosis as described. CTA neck: 1. Atherosclerotic disease within the imaged aortic arch and major branch vessels of the neck as detailed and most notably as follows. 2. Approximately 60-70% stenosis of the proximal left ICA. 3. Less than 50% stenosis of the proximal right ICA. 4. Mild/moderate ostial stenosis of the dominant left vertebral artery. The bilateral vertebral arteries are otherwise patent  within the neck without significant stenosis.  5. Anasarca. Partially visualized bilateral pleural effusions. The right pleural effusion may be loculated. 6. Sequela of renal osteodystrophy. Electronically Signed   By: Kellie Simmering   On: 03/02/2019 17:19   Ct Head Code Stroke Wo Contrast  Result Date: 03/02/2019 CLINICAL DATA:  Code stroke. 68 year old male found unresponsive after dialysis. Left side weakness and slurred speech. EXAM: CT HEAD WITHOUT CONTRAST TECHNIQUE: Contiguous axial images were obtained from the base of the skull through the vertex without intravenous contrast. COMPARISON:  Head CT 07/11/2018 and earlier. FINDINGS: Brain: Chronic encephalomalacia at the confluence of the right posterior temporal and occipital lobes. Superimposed confluent and widespread bilateral cerebral white matter hypodensity. Superimposed chronic lacunar infarcts in the corona radiata and deep gray matter nuclei. Chronic lacunar infarct in the right paracentral pons. Small chronic cerebellar infarcts in both hemispheres. No midline shift, ventriculomegaly, mass effect, evidence of mass lesion, intracranial hemorrhage or evidence of cortically based acute infarction. Gray-white matter differentiation is within normal limits throughout the brain. Vascular: Calcified atherosclerosis at the skull base. No suspicious intracranial vascular hyperdensity. Skull: No acute osseous abnormality identified. Sinuses/Orbits: Paranasal sinuses have cleared since February. Mastoids remain clear. Other: No acute orbit or scalp soft tissue finding. ASPECTS [facility] Stroke Program Early CT Score) Total score (0-10 with 10 being normal): 10 (chronic cortical encephalomalacia on the right and stable appearing bilateral chronic small vessel disease. IMPRESSION: 1. No acute cortically based infarct or acute intracranial hemorrhage identified. 2. Stable non contrast CT appearance of advanced chronic ischemic disease since February. 3.  ASPECTS 10 (chronic encephalomalacia). Study discussed  by telephone with Dr. Daleen Bo on 03/02/2019 at 15:46 . Electronically Signed   By: Genevie Ann M.D.   On: 03/02/2019 15:47    Procedures .Critical Care Performed by: Daleen Bo, MD Authorized by: Daleen Bo, MD   Critical care provider statement:    Critical care time (minutes):  35   Critical care start time:  03/02/2019 3:30 PM   Critical care end time:  03/02/2019 9:30 PM   Critical care time was exclusive of:  Separately billable procedures and treating other patients   Critical care was necessary to treat or prevent imminent or life-threatening deterioration of the following conditions:  CNS failure or compromise   Critical care was time spent personally by me on the following activities:  Blood draw for specimens, development of treatment plan with patient or surrogate, discussions with consultants, evaluation of patient's response to treatment, examination of patient, obtaining history from patient or surrogate, ordering and performing treatments and interventions, ordering and review of laboratory studies, pulse oximetry, re-evaluation of patient's condition, review of old charts and ordering and review of radiographic studies   (including critical care time)  Medications Ordered in ED Medications  iohexol (OMNIPAQUE) 350 MG/ML injection 75 mL (75 mLs Intravenous Contrast Given 03/02/19 1638)     Initial Impression / Assessment and Plan / ED Course  I have reviewed the triage vital signs and the nursing notes.  Pertinent labs & imaging results that were available during my care of the patient were reviewed by me and considered in my medical decision making (see chart for details).  Clinical Course as of Mar 01 2116  Sat Mar 02, 2019  2109 Normal except potassium low, glucose high, creatinine high, calcium low, albumin low, AST high, ALT high, elastase high, total bilirubin high, GFR low  Comprehensive metabolic panel(!) [EW]  2025 Normal  Ethanol [EW]  2109 Normal  except white,, hemoglobin low, platelets low  CBC(!) [EW]  2111 No acute CVA, images reviewed by me  CT HEAD CODE STROKE WO CONTRAST [EW]  2111 CT HEAD CODE STROKE WO CONTRAST [EW]  2112 CT Angio Neck W and/or Wo Contrast [EW]    Clinical Course User Index [EW] Daleen Bo, MD        Patient Vitals for the past 24 hrs:  BP Temp Temp src Pulse Resp SpO2 Height Weight  03/02/19 2000 102/86 -- -- 88 14 93 % -- --  03/02/19 1945 103/77 -- -- 87 14 99 % -- --  03/02/19 1930 96/80 -- -- 86 14 98 % -- --  03/02/19 1915 100/65 -- -- -- -- -- -- --  03/02/19 1900 107/68 -- -- -- 18 -- -- --  03/02/19 1845 111/70 -- -- 89 16 100 % -- --  03/02/19 1830 (!) 127/93 -- -- -- -- -- -- --  03/02/19 1815 111/76 -- -- (!) 38 14 (!) 86 % -- --  03/02/19 1800 107/70 -- -- -- 13 -- -- --  03/02/19 1745 102/78 -- -- -- 13 -- -- --  03/02/19 1730 105/79 -- -- 70 13 92 % -- --  03/02/19 1715 106/87 -- -- -- 13 -- -- --  03/02/19 1700 105/90 -- -- -- 14 -- -- --  03/02/19 1653 99/84 97.9 F (36.6 C) Oral 94 18 93 % -- --  03/02/19 1630 106/77 -- -- 88 16 94 % -- --  03/02/19 1615 101/73 -- -- 90 15 97 % -- --  03/02/19 1600 (!) 105/94 -- -- 91 15 100 % -- --  03/02/19 1544 104/84 -- -- 93 16 98 % -- --  03/02/19 1538 -- -- -- -- -- -- 5\' 3"  (1.6 m) 68 kg    9:17 PM Reevaluation with update and discussion. After initial assessment and treatment, an updated evaluation reveals his speech is somewhat more intelligible at this time, and the facial droop is somewhat improved on the left.  He is comfortable. Daleen Bo   Medical Decision Making: Evaluation consistent with CVA, negative initial work-up, for intracranial CVA.  Patient somewhat symptomatic after observation will require admission for further evaluation with MRI imaging and neurologic consultation.  He has chronic leukopenia.  He has metabolic changes, not acute, consistent with end-stage renal disease.  Will contact hospitalist for  admission   Tele-neurology consultation: CT head showed no acute hemorrhage or acute core infarct.  Lower Likelihood of Large Vessel Occlusion but Following Stat Studies are Recommended  CTA Head and Neck. Reviewed, No Indication of Large Vessel Occlusive Thrombus, Patient is not an NIR Candidate.   ED Physician notified of diagnostic impression and management plan on 03/02/2019 16:05:00  Our recommendations are outlined below.  Recommendations:       Activate Stroke Protocol Admission/Order Set       Stroke/Telemetry Floor       Neuro Checks       Bedside Swallow Eval       DVT Prophylaxis       IV Fluids, Normal Saline       Head of Bed 30 Degrees       Euglycemia and Avoid Hyperthermia (PRN Acetaminophen)  Routine Consultation with [facility] for Follow up [place] Performed by: Daleen Bo  Nursing Notes Reviewed/ Care Coordinated Applicable Imaging Reviewed Interpretation of Laboratory Data incorporated into ED treatment   9:21 PM-Consult complete  with hospitalist. Patient case explained and discussed.  He agrees to admit patient for further evaluation and treatment. Call ended at 9:45 PM  Plan: Admit  Final Clinical Impressions(s) / ED Diagnoses   Final diagnoses:  Cerebrovascular accident (CVA), unspecified mechanism [facility])    ED Discharge Orders    None       Daleen Bo, MD 03/02/19 2146    Daleen Bo, MD 03/10/19 1312

## 2019-03-02 NOTE — ED Triage Notes (Signed)
Pt was found unresponsive in wheelchair after returning from dialysis. Pt head was back, once responsive pt noted to have left sided facial droop and slurred speech. Left grip weak

## 2019-03-02 NOTE — Progress Notes (Signed)
CODE STROKE 1520 call time 1524 exam started 1530 exam finished, images to soc 1533 exam completed in Epic, called GR spoke with Marzetta Board

## 2019-03-02 NOTE — Consult Note (Addendum)
[facility] Tele[facility]rology Consult Services   Date of Service:   03/02/2019 15:36:36  Impression:     .  Rule Out Acute Ischemic Stroke  Comments/Sign-Out: Patient is a 68 years old man who presents to the ED with dysarthria, left facial droop, and left hemiparesis. NIHSS is 7. Non contrast CTH showed no acute abnormalities. Presentation is concerning for acute stroke, small vessel vs embolic source. Patient didn't receive tPA due to INR of 2.0. CTA head and neck were ordered and showed no LVO. Recommend admission for further work-up. Hold coumadin for now until MRI brain can be obtain and stroke size can be assessed. Start aspirin 325 mg. DDx also includes re expression of previous stroke symptoms since [facility] w/o showed area of encephalomalacia on the right hemisphere.  Metrics: Last Known Well: 03/02/2019 14:28:00 [facility] Notification Time: 03/02/2019 15:36:36 Arrival Time: 03/02/2019 15:26:00 Stamp Time: 03/02/2019 15:36:36 Time First Login Attempt: 03/02/2019 15:44:00 Video Start Time: 03/02/2019 15:44:00  Symptoms: Slurred speech and left facial droop NIHSS Start Assessment Time: 03/02/2019 15:51:34 Patient is not a candidate for Alteplase/Activase. Patient was not deemed candidate for Alteplase/Activase thrombolytics because of INR 2.0. Video End Time: 03/02/2019 16:05:00  CT head showed no acute hemorrhage or acute core infarct.  Lower Likelihood of Large Vessel Occlusion but Following Stat Studies are Recommended  CTA Head and Neck. Reviewed, No Indication of Large Vessel Occlusive Thrombus, Patient is not an NIR Candidate.   ED Physician notified of diagnostic impression and management plan on 03/02/2019 16:05:00  Our recommendations are outlined below.  Recommendations:     .  Activate Stroke Protocol Admission/Order Set     .  Stroke/Telemetry Floor     .  Neuro Checks     .  Bedside Swallow Eval     .  DVT Prophylaxis     .  IV Fluids,  Normal Saline     .  Head of Bed 30 Degrees     .  Euglycemia and Avoid Hyperthermia (PRN Acetaminophen)  Routine Consultation with [facility] for Follow up Care  Sign Out:     .  Discussed with Emergency Department Provider    ------------------------------------------------------------------------------  History of Present Illness: Patient is a 68 year old Male.  Patient was brought by EMS for symptoms of Slurred speech and left facial droop  Patient is a 68 years old man who presents to the ED from a dialysis center after he had sudden onset left facial droop, left side weakness, and slurred speech. LKW at 14:28. When they check him again at 15:05, they noticed the slurred speech and left facial droop and EMS was called. Patient has prior history of stroke with residual right weakness  Last seen normal was within 4.5 hours. There is no history of hemorrhagic complications or intracranial hemorrhage. There is history of Recent Anticoagulants. There is no history of recent major surgery. There is no history of recent stroke.  Past Medical History:     . Hypertension     . Atrial Fibrillation  Anticoagulant use:  coumadin  Antiplatelet use: No  Examination: BP(104/84), Pulse(90), Blood Glucose(100) 1A: Level of Consciousness - Alert; keenly responsive + 0 1B: Ask Month and Age - Could Not Answer Either Question Correctly + 2 1C: Blink Eyes & Squeeze Hands - Performs Both Tasks + 0 2: Test Horizontal Extraocular Movements - Normal + 0 3: Test Visual Fields - No Visual Loss + 0 4: Test Facial Palsy (Use Grimace if Obtunded) -  Partial paralysis (lower face) + 2 5A: Test Left Arm Motor Drift - Drift, but doesn't hit bed + 1 5B: Test Right Arm Motor Drift - No Drift for 10 Seconds + 0 6A: Test Left Leg Motor Drift - Drift, but doesn't hit bed + 1 6B: Test Right Leg Motor Drift - No Drift for 5 Seconds + 0 7: Test Limb Ataxia (FNF/Heel-Shin) - No Ataxia + 0 8: Test  Sensation - Normal; No sensory loss + 0 9: Test Language/Aphasia - Normal; No aphasia + 0 10: Test Dysarthria - Mild-Moderate Dysarthria: Slurring but can be understood + 1 11: Test Extinction/Inattention - No abnormality + 0  NIHSS Score: 7  Patient/Family was informed the Neurology Consult would happen via TeleHealth consult by way of interactive audio and video telecommunications and consented to receiving care in this manner.   Due to the immediate potential for life-threatening deterioration due to underlying acute neurologic illness, I spent 35 minutes providing critical care. This time includes time for face to face visit via telemedicine, review of medical records, imaging studies and discussion of findings with providers, the patient and/or family.   Dr Harriet Masson   [facility] [phone]  Case [id number]

## 2019-03-03 ENCOUNTER — Inpatient Hospital Stay (HOSPITAL_COMMUNITY): Payer: Medicare HMO

## 2019-03-03 DIAGNOSIS — B20 Human immunodeficiency virus [HIV] disease: Secondary | ICD-10-CM

## 2019-03-03 DIAGNOSIS — I10 Essential (primary) hypertension: Secondary | ICD-10-CM

## 2019-03-03 DIAGNOSIS — Z95810 Presence of automatic (implantable) cardiac defibrillator: Secondary | ICD-10-CM

## 2019-03-03 DIAGNOSIS — Z992 Dependence on renal dialysis: Secondary | ICD-10-CM

## 2019-03-03 DIAGNOSIS — I472 Ventricular tachycardia: Secondary | ICD-10-CM

## 2019-03-03 DIAGNOSIS — I340 Nonrheumatic mitral (valve) insufficiency: Secondary | ICD-10-CM

## 2019-03-03 DIAGNOSIS — Z9181 History of falling: Secondary | ICD-10-CM

## 2019-03-03 DIAGNOSIS — J438 Other emphysema: Secondary | ICD-10-CM

## 2019-03-03 DIAGNOSIS — N186 End stage renal disease: Secondary | ICD-10-CM

## 2019-03-03 DIAGNOSIS — I639 Cerebral infarction, unspecified: Secondary | ICD-10-CM

## 2019-03-03 DIAGNOSIS — I4729 Other ventricular tachycardia: Secondary | ICD-10-CM

## 2019-03-03 DIAGNOSIS — I361 Nonrheumatic tricuspid (valve) insufficiency: Secondary | ICD-10-CM

## 2019-03-03 DIAGNOSIS — G510 Bell's palsy: Secondary | ICD-10-CM

## 2019-03-03 DIAGNOSIS — Z7901 Long term (current) use of anticoagulants: Secondary | ICD-10-CM

## 2019-03-03 LAB — LIPID PANEL
Cholesterol: 113 mg/dL (ref 0–200)
HDL: 31 mg/dL — ABNORMAL LOW (ref >40)
LDL Cholesterol: 68 mg/dL (ref 0–99)
Total CHOL/HDL Ratio: 3.6 RATIO
Triglycerides: 71 mg/dL (ref <150)
VLDL: 14 mg/dL (ref 0–40)

## 2019-03-03 LAB — COMPREHENSIVE METABOLIC PANEL
ALT: 40 U/L (ref 0–44)
AST: 42 U/L — ABNORMAL HIGH (ref 15–41)
Albumin: 2.9 g/dL — ABNORMAL LOW (ref 3.5–5.0)
Alkaline Phosphatase: 150 U/L — ABNORMAL HIGH (ref 38–126)
Anion gap: 13 (ref 5–15)
BUN: 24 mg/dL — ABNORMAL HIGH (ref 8–23)
CO2: 26 mmol/L (ref 22–32)
Calcium: 8.6 mg/dL — ABNORMAL LOW (ref 8.9–10.3)
Chloride: 99 mmol/L (ref 98–111)
Creatinine, Ser: 3.13 mg/dL — ABNORMAL HIGH (ref 0.61–1.24)
GFR calc Af Amer: 23 mL/min — ABNORMAL LOW (ref >60)
GFR calc non Af Amer: 20 mL/min — ABNORMAL LOW (ref >60)
Glucose, Bld: 65 mg/dL — ABNORMAL LOW (ref 70–99)
Potassium: 3.4 mmol/L — ABNORMAL LOW (ref 3.5–5.1)
Sodium: 138 mmol/L (ref 135–145)
Total Bilirubin: 2.1 mg/dL — ABNORMAL HIGH (ref 0.3–1.2)
Total Protein: 6.6 g/dL (ref 6.5–8.1)

## 2019-03-03 LAB — GLUCOSE, CAPILLARY
Glucose-Capillary: 59 mg/dL — ABNORMAL LOW (ref 70–99)
Glucose-Capillary: 65 mg/dL — ABNORMAL LOW (ref 70–99)
Glucose-Capillary: 65 mg/dL — ABNORMAL LOW (ref 70–99)
Glucose-Capillary: 114 mg/dL — ABNORMAL HIGH (ref 70–99)
Glucose-Capillary: 88 mg/dL (ref 70–99)

## 2019-03-03 LAB — PROTIME-INR
INR: 2.1 — ABNORMAL HIGH (ref 0.8–1.2)
Prothrombin Time: 23.2 seconds — ABNORMAL HIGH (ref 11.4–15.2)

## 2019-03-03 LAB — SARS CORONAVIRUS 2 (TAT 6-24 HRS): SARS Coronavirus 2: NEGATIVE

## 2019-03-03 LAB — MRSA PCR SCREENING: MRSA by PCR: NEGATIVE

## 2019-03-03 LAB — ECHOCARDIOGRAM COMPLETE
Height: 63 in
Weight: 2398.4 oz

## 2019-03-03 MED ORDER — ASPIRIN 325 MG PO TABS: 325.0000 mg | ORAL_TABLET | Freq: Every day | ORAL | Status: DC

## 2019-03-03 MED ORDER — ASPIRIN 300 MG RE SUPP
300.0000 mg | Freq: Once | RECTAL | Status: AC
  Administered 2019-03-03: 300 mg via RECTAL
  Filled 2019-03-03: qty 1

## 2019-03-03 MED ORDER — ATORVASTATIN CALCIUM 40 MG PO TABS
80.0000 mg | ORAL_TABLET | Freq: Every day | ORAL | Status: DC
  Administered 2019-03-04 – 2019-03-08 (×5): 80 mg via ORAL
  Filled 2019-03-03 (×5): qty 2

## 2019-03-03 MED ORDER — METOPROLOL TARTRATE 5 MG/5ML IV SOLN
2.5000 mg | Freq: Four times a day (QID) | INTRAVENOUS | Status: DC
  Administered 2019-03-03 – 2019-03-04 (×5): 2.5 mg via INTRAVENOUS
  Filled 2019-03-03 (×6): qty 5

## 2019-03-03 MED ORDER — DEXTROSE 50 % IV SOLN: 12.5000 g | INTRAVENOUS | Status: AC

## 2019-03-03 MED ORDER — DEXTROSE-NACL 5-0.9 % IV SOLN
INTRAVENOUS | Status: DC
  Administered 2019-03-03 – 2019-03-04 (×2): via INTRAVENOUS

## 2019-03-03 MED ORDER — ACETAMINOPHEN 650 MG RE SUPP: 650.0000 mg | Freq: Four times a day (QID) | RECTAL | Status: DC | PRN

## 2019-03-03 MED ORDER — DEXTROSE 50 % IV SOLN
INTRAVENOUS | Status: AC
  Administered 2019-03-03: 25 mL
  Filled 2019-03-03: qty 50

## 2019-03-03 MED ORDER — WARFARIN SODIUM 2.5 MG PO TABS: 3.5000 mg | ORAL_TABLET | Freq: Every day | ORAL | Status: DC

## 2019-03-03 MED ORDER — TAMSULOSIN HCL 0.4 MG PO CAPS
0.4000 mg | ORAL_CAPSULE | Freq: Every day | ORAL | Status: DC
  Administered 2019-03-04 – 2019-03-08 (×5): 0.4 mg via ORAL
  Filled 2019-03-03 (×5): qty 1

## 2019-03-03 MED ORDER — BICTEGRAVIR-EMTRICITAB-TENOFOV 50-200-25 MG PO TABS
1.0000 | ORAL_TABLET | Freq: Every day | ORAL | Status: DC
  Administered 2019-03-04 – 2019-03-08 (×5): 1 via ORAL
  Filled 2019-03-03 (×8): qty 1

## 2019-03-03 MED ORDER — ALBUTEROL SULFATE (2.5 MG/3ML) 0.083% IN NEBU: 2.5000 mg | INHALATION_SOLUTION | RESPIRATORY_TRACT | Status: DC | PRN

## 2019-03-03 MED ORDER — METOPROLOL SUCCINATE ER 25 MG PO TB24: 12.5000 mg | ORAL_TABLET | Freq: Every day | ORAL | Status: DC

## 2019-03-03 MED ORDER — PANTOPRAZOLE SODIUM 40 MG IV SOLR
40.0000 mg | INTRAVENOUS | Status: DC
  Administered 2019-03-03 – 2019-03-04 (×2): 40 mg via INTRAVENOUS
  Filled 2019-03-03 (×2): qty 40

## 2019-03-03 MED ORDER — SENNOSIDES-DOCUSATE SODIUM 8.6-50 MG PO TABS: 1.0000 | ORAL_TABLET | Freq: Every evening | ORAL | Status: DC | PRN

## 2019-03-03 NOTE — Plan of Care (Signed)
  Problem: Acute Rehab PT Goals(only PT should resolve) Goal: Pt will Roll Supine to Side Flowsheets (Taken 03/03/2019 1215) Pt will Roll Supine to Side: with min assist Goal: Pt Will Go Supine/Side To Sit Flowsheets (Taken 03/03/2019 1215) Pt will go Supine/Side to Sit: with minimal assist Goal: Pt Will Go Sit To Supine/Side Flowsheets (Taken 03/03/2019 1215) Pt will go Sit to Supine/Side: with minimal assist Goal: Patient Will Perform Sitting Balance Flowsheets (Taken 03/03/2019 1215) Patient will perform sitting balance:  with minimal assist  1-2 min  with unilateral UE support Goal: Patient Will Transfer Sit To/From Stand Flowsheets (Taken 03/03/2019 1215) Patient will transfer sit to/from stand: with minimal assist Goal: Pt Will Transfer Bed To Chair/Chair To Bed Flowsheets (Taken 03/03/2019 1215) Pt will Transfer Bed to Chair/Chair to Bed: with mod assist Goal: Pt Will Perform Standing Balance Or Pre-Gait Flowsheets (Taken 03/03/2019 1215) Pt will perform standing balance or pre-gait:  1-2 min  with bilateral UE support  with min guard assist  Pamala Hurry D. Hartnett-Rands, MS, PT Per [facility] [phone] 03/03/2019

## 2019-03-03 NOTE — Progress Notes (Signed)
CRITICAL VALUE ALERT  Critical Value: 61  Date & Time Notied:  03/03/2019  1115  Provider Notified: Dr. Wynetta Emery  Orders Received/Actions taken: 1/2 amp D50 given

## 2019-03-03 NOTE — Progress Notes (Signed)
*  PRELIMINARY RESULTS* Echocardiogram 2D Echocardiogram has been performed.  Keith Young 03/03/2019, 10:24 AM

## 2019-03-03 NOTE — Progress Notes (Signed)
PROGRESS NOTE    Keith Young  ZOX:[id number]  DOB: [birth year]  DOA: 03/02/2019 PCP: [name], MD  Brief Admission Hx: 68 year old male sent to ED after he was found unresponsive after hemodialysis at the [facility].  He was seen by the tele-neurologist who was concerned for stroke.  He was not a candidate for TPA given that he was therapeutic on warfarin.  MRI brain was recommended.  MDM/Assessment & Plan:   1. Likely CVA -patient has a pronounced facial droop and has failed a swallow screen.  He likely has had an acute cerebrovascular accident.  Unfortunately we do not have MRI available at this hospital today.  We have called [facility] to see if we can transfer patient to have MRI done at that facility however they cannot do it today.  The earliest we can get an MRI will be tomorrow at this facility.  Continue stroke work-up as ordered.  Continue aspirin rectal suppository.  SLP evaluation PT OT evaluation and echocardiogram ordered.  Holding warfarin until after MRI per telemetry neurologist recommendations.  Check EEG.  Inpatient neurology consult will be available tomorrow.  Continue neurochecks.  Continue supportive therapy. 2. Hypoglycemia-likely from being n.p.o.  I have placed him on a dextrose infusion and will follow blood sugars closely. 3. End-stage renal disease on hemodialysis TTS-will consult nephrology tomorrow as he does not require dialysis until Tuesday. 4. Ischemic cardiomyopathy-patient has an ICD in place in 2014.  I have requested for the MRI staff to screen his device to be sure that he can safely have an MRI.  Also may need to have device interrogated to see if that may be the reason he was found unresponsive after his HD treatment. 5. PAF-he has been maintained on warfarin for his bioprosthetic AVR and for stroke prevention.  His INR today is 2.0.  Telemetry neurology asked to hold warfarin until after the MRI is been completed. 6. Essential  hypertension- he remains n.p.o. due to failing swallow screen.  I have changed his metoprolol to 2.5 mg IV every 6 hours with holding parameters.  He does take Midrin on dialysis days. 7. HIV- he has been maintained on HAART.  8. COPD - stable, resumed home bronchodilators.  9. Type 2 DM - he is hypoglycemic now, started on dextrose.    DVT prophylaxis: warfarin Code Status: DNR  Family Communication: patient updated bedside Disposition Plan: inpatient  Consultants:  Neurology   Procedures:  n/a  Antimicrobials:  n/a   Subjective: Pt says that he is thirsty.  No other complaints.   Objective: Vitals:   03/02/19 2320 03/03/19 0130 03/03/19 0323 03/03/19 0528  BP: 97/84 92/71 97/80  (!) 112/94  Pulse: 87 78 83 88  Resp: 16 14 14 16   Temp: 97.6 F (36.4 C)   98.2 F (36.8 C)  TempSrc: Oral     SpO2: 95% 97% 98% 99%  Weight:      Height:       No intake or output data in the 24 hours ending 03/03/19 1018 Filed Weights   03/02/19 1538  Weight: 68 kg   REVIEW OF SYSTEMS  As per history otherwise all reviewed and reported negative  Exam:  General exam: awake, alert, oriented to person, pronounced facial droop, slightly slurred speech. Respiratory system:  No increased work of breathing. Cardiovascular system: irregularly irregular S1 & S2 heard. No JVD, murmurs, gallops, clicks or pedal edema. Gastrointestinal system: Abdomen is nondistended, soft and nontender. Normal bowel  sounds heard. Central nervous system: Alert, with pronounced facial droop. Slightly diminished LUE strength otherwise no deficits. Extremities: no CCE.  Data Reviewed: Basic Metabolic Panel: Recent Labs  Lab 03/02/19 1534 03/03/19 0600  NA 136 138  K 3.1* 3.4*  CL 98 99  CO2 26 26  GLUCOSE 106* 65*  BUN 19 24*  CREATININE 2.53* 3.13*  CALCIUM 8.3* 8.6*   Liver Function Tests: Recent Labs  Lab 03/02/19 1534 03/03/19 0600  AST 47* 42*  ALT 45* 40  ALKPHOS 161* 150*  BILITOT  1.8* 2.1*  PROT 7.0 6.6  ALBUMIN 2.9* 2.9*   No results for input(s): LIPASE, AMYLASE in the last 168 hours. No results for input(s): AMMONIA in the last 168 hours. CBC: Recent Labs  Lab 03/02/19 1534  WBC 2.4*  NEUTROABS 1.6*  HGB 12.4*  HCT 36.3*  MCV 92.6  PLT 79*   Cardiac Enzymes: No results for input(s): CKTOTAL, CKMB, CKMBINDEX, TROPONINI in the last 168 hours. CBG (last 3)  Recent Labs    03/02/19 1548 03/03/19 0800 03/03/19 0859  GLUCAP 100* 59* 65*   Recent Results (from the past 240 hour(s))  MRSA PCR Screening     Status: None   Collection Time: 03/02/19 11:39 PM   Specimen: Nasopharyngeal  Result Value Ref Range Status   MRSA by PCR NEGATIVE NEGATIVE Final    Comment:        The GeneXpert MRSA Assay (FDA approved for NASAL specimens only), is one component of a comprehensive MRSA colonization surveillance program. It is not intended to diagnose MRSA infection nor to guide or monitor treatment for MRSA infections. Performed at [facility], [address], [place], [place] [address]      Studies: Ct Angio Head W Or Wo Contrast  Result Date: 03/02/2019 CLINICAL DATA:  Focal neuro deficit, less than 6 hours, stroke suspected. Additional history provided: Patient found unresponsive in wheelchair after returning from dialysis. Left-sided facial droop and slurred speech. Left grip weakness. EXAM: CT ANGIOGRAPHY HEAD AND NECK TECHNIQUE: Multidetector CT imaging of the head and neck was performed using the standard protocol during bolus administration of intravenous contrast. Multiplanar CT image reconstructions and MIPs were obtained to evaluate the vascular anatomy. Carotid stenosis measurements (when applicable) are obtained utilizing NASCET criteria, using the distal internal carotid diameter as the denominator. CONTRAST:  12mL OMNIPAQUE IOHEXOL 350 MG/ML SOLN COMPARISON:  None. Noncontrast head CT 03/02/2019, carotid artery duplex 06/27/2016 FINDINGS: CTA  NECK FINDINGS Aortic arch: Standard branching. Imaged portions of the aortic arch demonstrate no evidence of dissection or aneurysm. Atherosclerotic calcification within the visualized aortic arch and proximal major branch vessels of the neck. Right carotid system: CCA and ICA patent within the neck without significant stenosis (50% or greater). Atherosclerosis at the carotid bifurcation and within the proximal right ICA with less than 50% luminal narrowing of the proximal ICA. Distal to this, the ICA is patent within the neck without significant stenosis. Left carotid system: Scattered plaque within the common carotid artery without significant stenosis (50% or greater). Prominent plaque at the carotid bifurcation and within the proximal ICA. Approximately 60-70% stenosis within the proximal ICA. Distal to this, the ICAs patent within the neck without significant stenosis. Vertebral arteries: The right vertebral artery is patent within the neck without significant stenosis (50% or greater. Plaque at the origin of the dominant left vertebral artery results in mild-to-moderate ostial stenosis. Distal to this, the left vertebral artery is patent within the neck without significant stenosis.  Skeleton: Reversal of the expected cervical lordosis. Cervical spondylosis greatest at C6-C7. Diffusely heterogeneous appearance of the osseous structures likely reflecting renal osteodystrophy. Other neck: Generalized edema consistent with anasarca. Upper chest: Partially imaged bilateral pleural effusions. The right pleural effusion may be loculated. Sequela of prior median sternotomy. Partially visualized pacer leads. Review of the MIP images confirms the above findings CTA HEAD FINDINGS Anterior circulation: Atherosclerotic calcification within the intracranial internal carotid arteries bilaterally with regions of mild luminal stenosis. The right middle and anterior cerebral arteries are patent without significant proximal  stenosis. The left middle and anterior cerebral arteries are patent without significant proximal stenosis. Posterior circulation: The intracranial vertebral arteries are patent without significant stenosis. The basilar artery is patent without significant proximal stenosis. The right posterior cerebral artery is patent. Mild focal stenosis within the right PCA at the P1-P2 junction. Moderate focal stenosis within the P2 left PCA. Venous sinuses: Within limitations of contrast timing, no convincing thrombus. Anatomic variants: None significant Review of the MIP images confirms the above findings These results were called by telephone at the time of interpretation on 03/02/2019 at 5:19 pm to provider [facility] , who verbally acknowledged these results. IMPRESSION: CTA head: 1. No intracranial large vessel occlusion. 2. Moderate focal stenosis within the P2 left PCA. 3. Mild focal stenosis within the right PCA at the P1-P2 junction. 4. Additional intracranial atherosclerotic disease without significant stenosis as described. CTA neck: 1. Atherosclerotic disease within the imaged aortic arch and major branch vessels of the neck as detailed and most notably as follows. 2. Approximately 60-70% stenosis of the proximal left ICA. 3. Less than 50% stenosis of the proximal right ICA. 4. Mild/moderate ostial stenosis of the dominant left vertebral artery. The bilateral vertebral arteries are otherwise patent within the neck without significant stenosis. 5. Anasarca. Partially visualized bilateral pleural effusions. The right pleural effusion may be loculated. 6. Sequela of renal osteodystrophy. Electronically Signed   By: Kellie Simmering   On: 03/02/2019 17:19   Ct Angio Neck W And/or Wo Contrast  Result Date: 03/02/2019 CLINICAL DATA:  Focal neuro deficit, less than 6 hours, stroke suspected. Additional history provided: Patient found unresponsive in wheelchair after returning from dialysis. Left-sided facial droop and  slurred speech. Left grip weakness. EXAM: CT ANGIOGRAPHY HEAD AND NECK TECHNIQUE: Multidetector CT imaging of the head and neck was performed using the standard protocol during bolus administration of intravenous contrast. Multiplanar CT image reconstructions and MIPs were obtained to evaluate the vascular anatomy. Carotid stenosis measurements (when applicable) are obtained utilizing NASCET criteria, using the distal internal carotid diameter as the denominator. CONTRAST:  15mL OMNIPAQUE IOHEXOL 350 MG/ML SOLN COMPARISON:  None. Noncontrast head CT 03/02/2019, carotid artery duplex 06/27/2016 FINDINGS: CTA NECK FINDINGS Aortic arch: Standard branching. Imaged portions of the aortic arch demonstrate no evidence of dissection or aneurysm. Atherosclerotic calcification within the visualized aortic arch and proximal major branch vessels of the neck. Right carotid system: CCA and ICA patent within the neck without significant stenosis (50% or greater). Atherosclerosis at the carotid bifurcation and within the proximal right ICA with less than 50% luminal narrowing of the proximal ICA. Distal to this, the ICA is patent within the neck without significant stenosis. Left carotid system: Scattered plaque within the common carotid artery without significant stenosis (50% or greater). Prominent plaque at the carotid bifurcation and within the proximal ICA. Approximately 60-70% stenosis within the proximal ICA. Distal to this, the ICAs patent within the neck without significant stenosis. Vertebral  arteries: The right vertebral artery is patent within the neck without significant stenosis (50% or greater. Plaque at the origin of the dominant left vertebral artery results in mild-to-moderate ostial stenosis. Distal to this, the left vertebral artery is patent within the neck without significant stenosis. Skeleton: Reversal of the expected cervical lordosis. Cervical spondylosis greatest at C6-C7. Diffusely heterogeneous  appearance of the osseous structures likely reflecting renal osteodystrophy. Other neck: Generalized edema consistent with anasarca. Upper chest: Partially imaged bilateral pleural effusions. The right pleural effusion may be loculated. Sequela of prior median sternotomy. Partially visualized pacer leads. Review of the MIP images confirms the above findings CTA HEAD FINDINGS Anterior circulation: Atherosclerotic calcification within the intracranial internal carotid arteries bilaterally with regions of mild luminal stenosis. The right middle and anterior cerebral arteries are patent without significant proximal stenosis. The left middle and anterior cerebral arteries are patent without significant proximal stenosis. Posterior circulation: The intracranial vertebral arteries are patent without significant stenosis. The basilar artery is patent without significant proximal stenosis. The right posterior cerebral artery is patent. Mild focal stenosis within the right PCA at the P1-P2 junction. Moderate focal stenosis within the P2 left PCA. Venous sinuses: Within limitations of contrast timing, no convincing thrombus. Anatomic variants: None significant Review of the MIP images confirms the above findings These results were called by telephone at the time of interpretation on 03/02/2019 at 5:19 pm to provider [facility] , who verbally acknowledged these results. IMPRESSION: CTA head: 1. No intracranial large vessel occlusion. 2. Moderate focal stenosis within the P2 left PCA. 3. Mild focal stenosis within the right PCA at the P1-P2 junction. 4. Additional intracranial atherosclerotic disease without significant stenosis as described. CTA neck: 1. Atherosclerotic disease within the imaged aortic arch and major branch vessels of the neck as detailed and most notably as follows. 2. Approximately 60-70% stenosis of the proximal left ICA. 3. Less than 50% stenosis of the proximal right ICA. 4. Mild/moderate ostial stenosis  of the dominant left vertebral artery. The bilateral vertebral arteries are otherwise patent within the neck without significant stenosis. 5. Anasarca. Partially visualized bilateral pleural effusions. The right pleural effusion may be loculated. 6. Sequela of renal osteodystrophy. Electronically Signed   By: Kellie Simmering   On: 03/02/2019 17:19   Ct Head Code Stroke Wo Contrast  Result Date: 03/02/2019 CLINICAL DATA:  Code stroke. 68 year old male found unresponsive after dialysis. Left side weakness and slurred speech. EXAM: CT HEAD WITHOUT CONTRAST TECHNIQUE: Contiguous axial images were obtained from the base of the skull through the vertex without intravenous contrast. COMPARISON:  Head CT 07/11/2018 and earlier. FINDINGS: Brain: Chronic encephalomalacia at the confluence of the right posterior temporal and occipital lobes. Superimposed confluent and widespread bilateral cerebral white matter hypodensity. Superimposed chronic lacunar infarcts in the corona radiata and deep gray matter nuclei. Chronic lacunar infarct in the right paracentral pons. Small chronic cerebellar infarcts in both hemispheres. No midline shift, ventriculomegaly, mass effect, evidence of mass lesion, intracranial hemorrhage or evidence of cortically based acute infarction. Gray-white matter differentiation is within normal limits throughout the brain. Vascular: Calcified atherosclerosis at the skull base. No suspicious intracranial vascular hyperdensity. Skull: No acute osseous abnormality identified. Sinuses/Orbits: Paranasal sinuses have cleared since February. Mastoids remain clear. Other: No acute orbit or scalp soft tissue finding. ASPECTS [facility] Stroke Program Early CT Score) Total score (0-10 with 10 being normal): 10 (chronic cortical encephalomalacia on the right and stable appearing bilateral chronic small vessel disease. IMPRESSION: 1. No acute cortically  based infarct or acute intracranial hemorrhage identified. 2. Stable  non contrast CT appearance of advanced chronic ischemic disease since February. 3.  ASPECTS 10 (chronic encephalomalacia). Study discussed by telephone with Dr. Daleen Bo on 03/02/2019 at 15:46 . Electronically Signed   By: Genevie Ann M.D.   On: 03/02/2019 15:47   Scheduled Meds:  [START ON 03/04/2019] atorvastatin  80 mg Oral Daily   [START ON 03/04/2019] bictegravir-emtricitabine-tenofovir AF  1 tablet Oral Daily   calcium carbonate  1 tablet Oral BID   cholecalciferol  2,000 Units Oral Daily   docusate sodium  100 mg Oral Daily   fluticasone furoate-vilanterol  1 puff Inhalation Daily   metoprolol tartrate  2.5 mg Intravenous Q6H   multivitamin with minerals  1 tablet Oral Daily   pantoprazole (PROTONIX) IV  40 mg Intravenous Q24H   [START ON 03/04/2019] tamsulosin  0.4 mg Oral Daily   [START ON 03/04/2019] warfarin  3.5 mg Oral q1800   Warfarin - Physician Dosing Inpatient   Does not apply q1800   Continuous Infusions:  dextrose 5 % and 0.9% NaCl 50 mL/hr at 03/03/19 8110    Principal Problem:   CVA (cerebral vascular accident) [facility]) Active Problems:   Essential hypertension, benign   CAD (coronary artery disease)   ICD (implantable cardioverter-defibrillator) in place   HIV disease ([place])   COPD (chronic obstructive pulmonary disease) ([place])   PAF (paroxysmal atrial fibrillation) ([place])   Type 2 diabetes mellitus with hypertension and end stage renal disease on dialysis ([place])   ESRD (end stage renal disease) on dialysis ([place])   Chronic anticoagulation  Time spent:   Irwin Brakeman, MD [facility] 03/03/2019, 10:18 AM    LOS: 1 day  How to contact the [facility] Attending or Consulting provider [place] or covering provider during after hours 7P -7A, for this patient?  1. Check the care team in [facility] and look for a) attending/consulting TRH provider listed and b) the [facility] team listed 2. Log into [email or web address] and use [place]'s universal password to access. If you  do not have the password, please contact the hospital operator. 3. Locate the [facility] provider you are looking for under [facility] and page to a number that you can be directly reached. 4. If you still have difficulty reaching the provider, please page the [facility] (Director on Call) for the Hospitalists listed on amion for assistance.

## 2019-03-03 NOTE — Evaluation (Signed)
Physical Therapy Evaluation Patient Details Name: Keith Young MRN: [id number] DOB: 19-Apr-1951 Today's Date: 03/03/2019   History of Present Illness  68 year old male sent to ED after he was found unresponsive after hemodialysis at the [facility].  He was seen by the tele-neurologist who was concerned for stroke.  He was not a candidate for TPA given that he was therapeutic on warfarin.  MRI brain was recommended. Patient admitted from [facility].    Clinical Impression  Pt admitted with above diagnosis. Patient functioning near reported baseline at [facility] - wheelchair bound using a bed pan. Patient requiring mod assist for bed mobility, transfers and short distance ambulation with RW. Patient reports he would use a manual wheelchair using his arms to move about [facility]. Pt currently with functional limitations due to the deficits listed below (see PT Problem List). Pt will benefit from skilled PT to increase their independence and safety with mobility to allow discharge to the venue listed below.       Follow Up Recommendations SNF;Supervision/Assistance - 24 hour    Equipment Recommendations  None recommended by PT    Recommendations for Other Services       Precautions / Restrictions Precautions Precautions: Fall Restrictions Weight Bearing Restrictions: No      Mobility  Bed Mobility Overal bed mobility: Needs Assistance Bed Mobility: Supine to Sit;Sit to Supine Rolling: Mod assist   Supine to sit: Mod assist        Transfers Overall transfer level: Needs assistance Equipment used: Rolling walker (2 wheeled) Transfers: Sit to/from Stand Sit to Stand: Mod assist;Min assist         General transfer comment: min-mod assist to power up  Ambulation/Gait Ambulation/Gait assistance: Mod assist Gait Distance (Feet): 4 Feet Assistive device: Rolling walker (2 wheeled) Gait Pattern/deviations: Step-to pattern;Decreased step length - left;Decreased step  length - right;Decreased stride length;Decreased weight shift to right Gait velocity: decreased   General Gait Details: slow labored gait limited to side-stepping right along bedside for safety as patient reported non-ambulatory at [place] Mobility    Modified Rankin (Stroke Patients Only) Modified Rankin (Stroke Patients Only) Pre-Morbid Rankin Score: Severe disability Modified Rankin: Severe disability     Balance Overall balance assessment: Needs assistance Sitting-balance support: No upper extremity supported;Feet supported Sitting balance-Leahy Scale: Poor Sitting balance - Comments: lost balance to left without UE support; assistance to place bilateral UEs on bed for support and patient able to maintain balance with close supervision Postural control: Left lateral lean Standing balance support: Bilateral upper extremity supported;During functional activity Standing balance-Leahy Scale: Poor Standing balance comment: fair/poor with RW                             Pertinent Vitals/Pain Pain Assessment: No/denies pain    Home Living Family/patient expects to be discharged to:: Skilled nursing facility                      Prior Function Level of Independence: Needs assistance   Gait / Transfers Assistance Needed: Per patient and nursing report, patient wheelchair bound           Hand Dominance        Extremity/Trunk Assessment   Upper Extremity Assessment Upper Extremity Assessment: Generalized weakness    Lower Extremity Assessment Lower Extremity Assessment: Generalized weakness  Communication   Communication: Expressive difficulties;HOH  Cognition Arousal/Alertness: Awake/alert Behavior During Therapy: WFL for tasks assessed/performed Overall Cognitive Status: No family/caregiver present to determine baseline cognitive functioning                                         General Comments      Exercises     Assessment/Plan    PT Assessment Patient needs continued PT services  PT Problem List Decreased strength;Decreased mobility;Decreased safety awareness;Decreased activity tolerance;Decreased cognition;Decreased balance;Decreased knowledge of use of DME       PT Treatment Interventions DME instruction;Therapeutic activities;Cognitive remediation;Gait training;Therapeutic exercise;Patient/family education;Balance training;Wheelchair mobility training;Neuromuscular re-education    PT Goals (Current goals can be found in the Care Plan section)  Acute Rehab PT Goals Patient Stated Goal: Go back to [facility] PT Goal Formulation: With patient Time For Goal Achievement: 03/17/19 Potential to Achieve Goals: Good    Frequency Min 2X/week   Barriers to discharge        Co-evaluation               AM-PAC PT "6 Clicks" Mobility  Outcome Measure Help needed turning from your back to your side while in a flat bed without using bedrails?: A Lot Help needed moving from lying on your back to sitting on the side of a flat bed without using bedrails?: A Lot Help needed moving to and from a bed to a chair (including a wheelchair)?: A Lot Help needed standing up from a chair using your arms (e.g., wheelchair or bedside chair)?: A Lot Help needed to walk in hospital room?: A Lot Help needed climbing 3-5 steps with a railing? : Total 6 Click Score: 11    End of Session Equipment Utilized During Treatment: Gait belt Activity Tolerance: Patient tolerated treatment well;Patient limited by fatigue Patient left: in bed;with call bell/phone within reach Nurse Communication: Mobility status PT Visit Diagnosis: Unsteadiness on feet (R26.81);Other abnormalities of gait and mobility (R26.89);Muscle weakness (generalized) (M62.81)    Time: 7915-0569 PT Time Calculation (min) (ACUTE ONLY): 30 min   Charges:   PT Evaluation $PT Eval Moderate Complexity: 1  Mod PT Treatments $Therapeutic Activity: 8-22 mins        Floria Raveling. Hartnett-Rands, MS, PT Per Day [phone] 03/03/2019, 12:12 PM

## 2019-03-04 ENCOUNTER — Ambulatory Visit: Payer: Medicare HMO | Admitting: Internal Medicine

## 2019-03-04 ENCOUNTER — Inpatient Hospital Stay (HOSPITAL_COMMUNITY): Payer: Medicare HMO

## 2019-03-04 LAB — BASIC METABOLIC PANEL
Anion gap: 14 (ref 5–15)
BUN: 30 mg/dL — ABNORMAL HIGH (ref 8–23)
CO2: 24 mmol/L (ref 22–32)
Calcium: 8.8 mg/dL — ABNORMAL LOW (ref 8.9–10.3)
Chloride: 100 mmol/L (ref 98–111)
Creatinine, Ser: 3.96 mg/dL — ABNORMAL HIGH (ref 0.61–1.24)
GFR calc Af Amer: 17 mL/min — ABNORMAL LOW (ref >60)
GFR calc non Af Amer: 15 mL/min — ABNORMAL LOW (ref >60)
Glucose, Bld: 80 mg/dL (ref 70–99)
Potassium: 4 mmol/L (ref 3.5–5.1)
Sodium: 138 mmol/L (ref 135–145)

## 2019-03-04 LAB — CBC
HCT: 38.2 % — ABNORMAL LOW (ref 39.0–52.0)
Hemoglobin: 12.7 g/dL — ABNORMAL LOW (ref 13.0–17.0)
MCH: 31.6 pg (ref 26.0–34.0)
MCHC: 33.2 g/dL (ref 30.0–36.0)
MCV: 95 fL (ref 80.0–100.0)
Platelets: 79 10*3/uL — ABNORMAL LOW (ref 150–400)
RBC: 4.02 MIL/uL — ABNORMAL LOW (ref 4.22–5.81)
RDW: 21.5 % — ABNORMAL HIGH (ref 11.5–15.5)
WBC: 2.7 10*3/uL — ABNORMAL LOW (ref 4.0–10.5)
nRBC: 0 % (ref 0.0–0.2)

## 2019-03-04 LAB — MAGNESIUM: Magnesium: 2.1 mg/dL (ref 1.7–2.4)

## 2019-03-04 LAB — GLUCOSE, CAPILLARY
Glucose-Capillary: 183 mg/dL — ABNORMAL HIGH (ref 70–99)
Glucose-Capillary: 79 mg/dL (ref 70–99)
Glucose-Capillary: 84 mg/dL (ref 70–99)
Glucose-Capillary: 84 mg/dL (ref 70–99)
Glucose-Capillary: 92 mg/dL (ref 70–99)

## 2019-03-04 LAB — PHOSPHORUS: Phosphorus: 3.9 mg/dL (ref 2.5–4.6)

## 2019-03-04 LAB — PROTIME-INR
INR: 2.5 — ABNORMAL HIGH (ref 0.8–1.2)
Prothrombin Time: 26.3 seconds — ABNORMAL HIGH (ref 11.4–15.2)

## 2019-03-04 MED ORDER — MIDODRINE HCL 5 MG PO TABS
10.0000 mg | ORAL_TABLET | ORAL | Status: DC
  Administered 2019-03-05 – 2019-03-07 (×2): 10 mg via ORAL
  Filled 2019-03-04 (×2): qty 2

## 2019-03-04 MED ORDER — PANTOPRAZOLE SODIUM 40 MG PO TBEC
40.0000 mg | DELAYED_RELEASE_TABLET | Freq: Every day | ORAL | Status: DC
  Administered 2019-03-05 – 2019-03-08 (×4): 40 mg via ORAL
  Filled 2019-03-04 (×4): qty 1

## 2019-03-04 MED ORDER — ORAL CARE MOUTH RINSE
15.0000 mL | Freq: Two times a day (BID) | OROMUCOSAL | Status: DC
  Administered 2019-03-04 – 2019-03-07 (×4): 15 mL via OROMUCOSAL

## 2019-03-04 MED ORDER — ASPIRIN 325 MG PO TABS
325.0000 mg | ORAL_TABLET | Freq: Once | ORAL | Status: AC
  Administered 2019-03-04: 325 mg via ORAL
  Filled 2019-03-04: qty 1

## 2019-03-04 MED ORDER — CHLORHEXIDINE GLUCONATE 0.12 % MT SOLN
15.0000 mL | Freq: Two times a day (BID) | OROMUCOSAL | Status: DC
  Administered 2019-03-04 – 2019-03-08 (×8): 15 mL via OROMUCOSAL
  Filled 2019-03-04 (×9): qty 15

## 2019-03-04 MED ORDER — ASPIRIN 300 MG RE SUPP: 300.0000 mg | Freq: Once | RECTAL | Status: DC

## 2019-03-04 MED ORDER — METOPROLOL SUCCINATE ER 25 MG PO TB24
25.0000 mg | ORAL_TABLET | Freq: Every day | ORAL | Status: DC
  Administered 2019-03-06 – 2019-03-07 (×2): 25 mg via ORAL
  Filled 2019-03-04 (×3): qty 1

## 2019-03-04 NOTE — Progress Notes (Signed)
ANTICOAGULATION CONSULT NOTE - Initial Consult  Pharmacy Consult for warfarin Indication: atrial fibrillation/AVR  No Known Allergies  Patient Measurements: Height: 5\' 3"  (160 cm) Weight: 149 lb 14.4 oz (68 kg) IBW/kg (Calculated) : 56.9   Vital Signs: Temp: 97.6 F (36.4 C) (10/05 1130) Temp Source: Axillary (10/05 1130) BP: 100/70 (10/05 1130) Pulse Rate: 84 (10/05 1130)  Labs: Recent Labs    03/02/19 1534 03/03/19 0600 03/04/19 0511 03/04/19 0732  HGB 12.4*  --  12.7*  --   HCT 36.3*  --  38.2*  --   PLT 79*  --  79*  --   APTT 42*  --   --   --   LABPROT 22.2* 23.2*  --  26.3*  INR 2.0* 2.1*  --  2.5*  CREATININE 2.53* 3.13* 3.96*  --     Estimated Creatinine Clearance: 14.6 mL/min (A) (by C-G formula based on SCr of 3.96 mg/dL (H)).   Medical History: Past Medical History:  Diagnosis Date  . AICD (automatic cardioverter/defibrillator) present   . Alcoholism in remission ([place])   . Anemia   . Aortic insufficiency   . Arthritis   . At moderate risk for fall   . Bell's palsy   . Chronic systolic heart failure (HCC)    a. EF previously 15-20% with cath in 2014 showing no significant CAD b. EF 20-25% by echo in 06/2018  . CKD (chronic kidney disease), stage IV ([place])   . COPD (chronic obstructive pulmonary disease) ([place])   . Essential hypertension, benign   . HIV disease ([place]) 09/06/2016  . Hyperlipidemia   . NICM (nonischemic cardiomyopathy) ([place])   . Noncompliance with medication regimen   . NSVT (nonsustained ventricular tachycardia) ([place])   . Numbness of right jaw    Had a stoke in 01/2013. Numbness is occasional, especially when trying to chew.  . OSA (obstructive sleep apnea)   . Productive cough 08/2013   With brown sputum   . S/P aortic valve replacement with bioprosthetic valve   . Stroke ([place]) 01/2013   weakness of right side from CVA  . Type 2 diabetes mellitus ([place])   . 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 Uses hearing aid 2014   recently received new hearing aids    Medications:  Medications Prior to Admission  Medication Sig Dispense Refill Last Dose  . atorvastatin (LIPITOR) 80 MG tablet Take 80 mg by mouth daily.    03/01/2019 at Unknown time  . bictegravir-emtricitabine-tenofovir AF (BIKTARVY) 50-200-25 MG TABS tablet Take 1 tablet by mouth daily. 30 tablet 11 03/01/2019 at Unknown time  . calcium carbonate (TUMS EX) 750 MG chewable tablet Chew 1 tablet by mouth 2 (two) times daily.   03/01/2019 at Unknown time  . Cholecalciferol (VITAMIN D3) 2000 UNITS TABS Take 2,000 mg by mouth daily.   03/01/2019 at Unknown time  . docusate sodium (COLACE) 100 MG capsule Take 100 mg by mouth daily.   03/01/2019 at Unknown time  . Fluticasone-Salmeterol (ADVAIR) 100-50 MCG/DOSE AEPB Inhale 1 puff into the lungs daily.   03/01/2019 at Unknown time  . metoprolol succinate (TOPROL-XL) 25 MG 24 hr tablet Take 1 tablet (25 mg total) by mouth daily.   03/01/2019 at 1800  . midodrine (PROAMATINE) 10 MG tablet Take 10 mg by mouth. Send medication with resident to be given at center. Once a day on Tues.,Thurs., Sat.   03/02/2019 at 0830  .  multivitamin (RENA-VIT) TABS tablet Take 1 tablet by mouth at bedtime.  0 03/01/2019 at Unknown time  . omeprazole (PRILOSEC) 20 MG capsule Take 20 mg by mouth at bedtime.   03/01/2019 at Unknown time  . senna-docusate (SENEXON-S) 8.6-50 MG tablet Take 1 tablet by mouth daily.   03/01/2019 at Unknown time  . tamsulosin (FLOMAX) 0.4 MG CAPS capsule Take 0.4 mg by mouth daily.   03/01/2019 at Unknown time  . warfarin (COUMADIN) 1 MG tablet Take 1 mg by mouth daily. Give along with 2.5 mg tab for total of 3.5 daily   03/01/2019 at 1800  . warfarin (COUMADIN) 2.5 MG tablet Take 2.5 mg by mouth daily. Give along with 1 mg tab for total of 3.5 mg daily   03/01/2019 at 1800  . acetaminophen (TYLENOL) 325 MG tablet Take 2 tablets (650 mg total) by mouth every 4 (four) hours  as needed for mild pain (or temp > 37.5 C (99.5 F)).     Marland Kitchen albuterol (PROVENTIL) (2.5 MG/3ML) 0.083% nebulizer solution Take 3 mLs (2.5 mg total) by nebulization every 4 (four) hours as needed for wheezing or shortness of breath. 75 mL 12   . coal tar (NEUTROGENA T-GEL) 0.5 % shampoo Apply to scalp on shower days (Twice a week) for dandruff and dry scalp Wed., and Sat.     . NON FORMULARY Diet Type:  Dysphagia 2 with thin liquids (NO Straws)     . Nutritional Supplements (FEEDING SUPPLEMENT, NEPRO CARB STEADY,) LIQD Take by mouth daily.     Marland Kitchen UNABLE TO FIND FLUID RESTRICTION 1200 CC/ 24hrs. Breakfast (12oz) 360 ml lunch (8oz) 240 ml Dinner (12oz) 360 ml Every Shift Day, Evening, Night     . ZINC OXIDE EX Apply Zinc oxide to bilateral buttocks area for irritation. Every Shift Day, Day, Day       Assessment: Pharmacy consulted to dose warfarin for patient with atrial fibrillation and bioprosthetic AVR.  Patient's warfarin is currently being held due to likely CVA where MRI is scheduled 10/6. Home dose listed as 3.5 mg daily  INR therapeutic at 2.5  Goal of Therapy:  INR 2-3 (2.5-3.0 per note on 9/1) Monitor platelets by anticoagulation protocol: Yes   Plan:  Hold warfarin due to scheduled MRI 10/6 at [facility]. Monitor labs and s/s of bleeding  Ramond Craver 03/04/2019,1:02 PM

## 2019-03-04 NOTE — Evaluation (Signed)
Clinical/Bedside Swallow Evaluation Patient Details  Name: Keith Young MRN: [id number] Date of Birth: 11/07/1950  Today's Date: 03/04/2019 Time: SLP Start Time (ACUTE ONLY): 1000 SLP Stop Time (ACUTE ONLY): 1022 SLP Time Calculation (min) (ACUTE ONLY): 22 min  Past Medical History:  Past Medical History:  Diagnosis Date  . AICD (automatic cardioverter/defibrillator) present   . Alcoholism in remission ([place])   . Anemia   . Aortic insufficiency   . Arthritis   . At moderate risk for fall   . Bell's palsy   . Chronic systolic heart failure (HCC)    a. EF previously 15-20% with cath in 2014 showing no significant CAD b. EF 20-25% by echo in 06/2018  . CKD (chronic kidney disease), stage IV ([place])   . COPD (chronic obstructive pulmonary disease) ([place])   . Essential hypertension, benign   . HIV disease ([place]) 09/06/2016  . Hyperlipidemia   . NICM (nonischemic cardiomyopathy) ([place])   . Noncompliance with medication regimen   . NSVT (nonsustained ventricular tachycardia) ([place])   . Numbness of right jaw    Had a stoke in 01/2013. Numbness is occasional, especially when trying to chew.  . OSA (obstructive sleep apnea)   . Productive cough 08/2013   With brown sputum   . S/P aortic valve replacement with bioprosthetic valve   . Stroke ([place]) 01/2013   weakness of right side from CVA  . Type 2 diabetes mellitus ([place])   . Type 2 diabetes mellitus with hypertension and end stage renal disease on dialysis ([place]) 07/31/2018  . Uses hearing aid 2014   recently received new hearing aids   Past Surgical History:  Past Surgical History:  Procedure Laterality Date  . AV FISTULA PLACEMENT Left 07/20/2018   Procedure: INSERTION ARTERIOVENOUS GORTEX GRAFT  LEFT ARM;  Surgeon: Rosetta Posner, MD;  Location: [place];  Service: Vascular;  Laterality: Left;  . BIOPSY N/A 04/10/2014   Procedure: GASTRIC BIOPSY;  Surgeon: Daneil Dolin, MD;  Location: [facility];  Service: Endoscopy;  Laterality: N/A;  . BIOPSY   10/12/2015   Procedure: BIOPSY;  Surgeon: Daneil Dolin, MD;  [facility];  Service: Endoscopy;;  stomach bx's  . CARDIAC DEFIBRILLATOR PLACEMENT  11/12/12   [employer] Inogen MINI ICD implanted in [place] at [place] AVR per Dr Nelly Laurence note  . COLONOSCOPY WITH PROPOFOL N/A 04/10/2014   RMR: Colonic Diverticulosis  . ESOPHAGOGASTRODUODENOSCOPY (EGD) WITH PROPOFOL N/A 04/10/2014   RMR: Mild erosive reflux esophagitis. Multiple antral polyps likely hyperplastic status post removal by hot snare cautery technique. Diffusely abnormal stomach status post gastric biopsy I suspect some of patients anemaia may be due to intermittent oozing from the stomach. It would be difficult and a risky proposition to attempt complete removal of all of his gastric polyps.  . ESOPHAGOGASTRODUODENOSCOPY (EGD) WITH PROPOFOL N/A 10/12/2015   Procedure: ESOPHAGOGASTRODUODENOSCOPY (EGD) WITH PROPOFOL;  Surgeon: Daneil Dolin, MD;  [facility];  Service: Endoscopy;  Laterality: N/A;  1191 - moved to 9:15  . INSERTION OF DIALYSIS CATHETER Right 07/20/2018   Procedure: EXCHANGE OF DIALYSIS CATHETER RIGHT INTERNAL JUGULAR;  Surgeon: Rosetta Posner, MD;  Location: [place];  Service: Vascular;  Laterality: Right;  . IR FLUORO GUIDE CV LINE RIGHT  07/09/2018  . IR US GUIDE VASC ACCESS RIGHT  07/09/2018  . POLYPECTOMY N/A 04/10/2014   Procedure: GASTRIC POLYPECTOMY;  Surgeon: Daneil Dolin, MD;  Location: [facility];  Service: Endoscopy;  Laterality: N/A;  . RIGHT HEART CATHETERIZATION N/A 02/06/2014   Procedure: RIGHT HEART CATH;  Surgeon: Larey Dresser, MD;  Location[facility] C[facility] LAB;  Service: Cardiovascular;  Laterality: N/A;   HPI:  68 year old male sent to ED after he was found unresponsive after hemod[facility] at the [facility].  He was seen by the tele-neurologist who was concerned for stroke.  He was not a candidate for TPA given that he was  therapeutic on warfarin.  MRI brain was recommended. Patient admitted from [facility]. Pt with known h/o dysphagia and was previously on D2/HTL upgraded to thin in June via MBSS.    Assessment / Plan / Recommendation Clinical Impression  Pt seen at bedside for clinical swallow evaluation due to failed [facility] Swallow screen over the weekend. Pt is known to SLP service from previous MBSS completed in June 2020 with recommendation for D2 and thin liquids. Pt has reportedly been tolerating that diet at [facility] since that time. Oral motor evaluation reveals left facial weakness/asymmetry which is likely baseline. Vocal quality is clear. Pt presents with belching and delayed/immediate coughing after thins and nectars; improved performance with HTL. Pt known to be impulsive with intake and required mod cues to take small sips. Will initiate D2 and HTL via cup sips and po medications whole in puree with reassessment tomorrow and Pt may need MBSS. Above to RN. Pt will need assist with oral care.   SLP Visit Diagnosis: Dysphagia, oropharyngeal phase (R13.12)    Aspiration Risk  Moderate aspiration risk    Diet Recommendation Dysphagia 2 (Fine chop);Honey-thick liquid   Liquid Administration via: Cup;No straw Medication Administration: Whole meds with puree Supervision: Staff to assist with self feeding;Full supervision/cueing for compensatory strategies Compensations: Slow rate;Small sips/bites;Lingual sweep for clearance of pocketing;Multiple dry swallows after each bite/sip Postural Changes: Seated upright at 90 degrees;Remain upright for at least 30 minutes after po intake    Other  Recommendations Oral Care Recommendations: Oral care BID;Staff/trained caregiver to provide oral care Other Recommendations: Order thickener from pharmacy;Prohibited food (jello, ice cream, thin soups);Clarify dietary restrictions   Follow up Recommendations Skilled Nursing facility      Frequency and Duration min 2x/week   1 week       Prognosis Prognosis for Safe Diet Advancement: Fair Barriers to Reach Goals: Cognitive deficits      Swallow Study   General Date of Onset: 03/02/19 HPI: 68 year old male sent to ED after he was found unresponsive after hemodialysis at the [facility].  He was seen by the tele-neurologist who was concerned for stroke.  He was not a candidate for TPA given that he was therapeutic on warfarin.  MRI brain was recommended. Patient admitted from [facility]. Pt with known h/o dysphagia and was previously on D2/HTL upgraded to thin in June via MBSS.  Type of Study: Bedside Swallow Evaluation Previous Swallow Assessment: 11/19/18 MBS D2/thin see note Diet Prior to this Study: NPO Temperature Spikes Noted: No Respiratory Status: Room air History of Recent Intubation: No Behavior/Cognition: Alert;Cooperative;Pleasant mood Oral Cavity Assessment: Within Functional Limits Oral Care Completed by SLP: Yes Oral Cavity - Dentition: Edentulous(Pt states that he has U/L dentures but does not wear to eat) Vision: Functional for self-feeding Self-Feeding Abilities: Able to feed self;Needs set up Patient Positioning: Upright in bed Baseline Vocal Quality: Normal Volitional Cough: Strong Volitional Swallow: Able to elicit    Oral/Motor/Sensory Function Overall Oral Motor/Sensory Function: Mild impairment Facial ROM: Reduced left;Suspected  CN VII (facial) dysfunction(Present PTA per [facility] SLP) Facial Symmetry: Abnormal symmetry left Facial Strength: Reduced left Facial Sensation: Within Functional Limits Lingual ROM: Within Functional Limits Lingual Symmetry: Within Functional Limits Lingual Strength: Within Functional Limits Lingual Sensation: Within Functional Limits Velum: Within Functional Limits Mandible: Within Functional Limits   Ice Chips Ice chips: Within functional limits Presentation: Spoon   Thin Liquid Thin Liquid: Impaired Presentation: Cup;Self Fed Oral Phase  Functional Implications: Oral holding Pharyngeal  Phase Impairments: Suspected delayed Swallow;Multiple swallows;Cough - Immediate;Cough - Delayed    Nectar Thick Nectar Thick Liquid: Impaired Presentation: Cup;Self Fed Pharyngeal Phase Impairments: Suspected delayed Swallow;Cough - Immediate   Honey Thick Honey Thick Liquid: Within functional limits Presentation: Self fed;Cup;Spoon   Puree Puree: Within functional limits Presentation: Spoon   Solid     Solid: Impaired Presentation: Spoon Oral Phase Impairments: Reduced lingual movement/coordination Oral Phase Functional Implications: Oral residue Pharyngeal Phase Impairments: Multiple swallows     Thank you,  Genene Churn, [place]  Bartley Vuolo 03/04/2019,10:17 AM

## 2019-03-04 NOTE — NC FL2 (Signed)
[place] MEDICAID FL2 LEVEL OF CARE SCREENING TOOL     IDENTIFICATION  Patient Name: Keith Young Birthdate: Apr 29, 1951 Sex: male Admission Date (Current Location): 03/02/2019  [facility] and [place] Number:  [employer] and Address:  [place] [address], [place]      Provider Number: [id number]  Attending Physician Name and Address:  Murlean Iba, MD  Relative Name and Phone Number:       Current Level of Care: Hospital Recommended Level of Care: [place] Prior Approval Number:    Date Approved/Denied:   PASRR Number: [id number]  Discharge Plan: SNF    Current Diagnoses: Patient Active Problem List   Diagnosis Date Noted  . NSVT (nonsustained ventricular tachycardia) ([place])   . At moderate risk for fall   . Bell's palsy   . Vascular dementia without behavioral disturbance ([place]) 02/09/2019  . Chronic anticoagulation 12/11/2018  . Hypertension associated with stage 5 chronic kidney disease due to type 2 diabetes mellitus ([place]) 12/04/2018  . S/P AVR (aortic valve replacement) 12/04/2018  . Anemia due to end stage renal disease ([place]) 10/02/2018  . ESRD (end stage renal disease) on dialysis ([place]) 08/06/2018  . PAF (paroxysmal atrial fibrillation) ([place]) 07/31/2018  . Dyslipidemia associated with type 2 diabetes mellitus ([place]) 07/31/2018  . Type 2 diabetes mellitus with hypertension and end stage renal disease on dialysis ([place]) 07/31/2018  . Chronic kidney disease with end stage renal disease on dialysis due to type 2 diabetes mellitus ([place]) 07/31/2018  . Dialysis patient ([place]) 07/31/2018  . BPH with obstruction/lower urinary tract symptoms 07/31/2018  . GERD without esophagitis 07/31/2018  . Chronic constipation 07/31/2018  . 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 Malnutrition of moderate degree 07/20/2018  . Diverticulosis   . Tachycardia   . E coli bacteremia   . Septic shock ([place]) 07/07/2018  .  Chronic combined systolic and diastolic heart failure ([place]) 12/09/2016  . COPD (chronic obstructive pulmonary disease) ([place]) 09/12/2016  . Protein-calorie malnutrition, severe 09/06/2016  . HIV disease ([place]) 09/06/2016  . CVA (cerebral vascular accident) ([place]) 06/26/2016  . ICD (implantable cardioverter-defibrillator) in place 01/22/2015  . Anemia in chronic kidney disease 03/26/2014  . Carotid bruit 12/18/2013  . Essential hypertension, benign 10/23/2013  . CAD (coronary artery disease) 10/23/2013    Orientation RESPIRATION BLADDER Height & Weight     Self, Place  Normal Incontinent Weight: 149 lb 14.4 oz (68 kg) Height:  5\' 3"  (160 cm)  BEHAVIORAL SYMPTOMS/MOOD NEUROLOGICAL BOWEL NUTRITION STATUS      Incontinent Diet(see dc summary)  AMBULATORY STATUS COMMUNICATION OF NEEDS Skin   Extensive Assist Verbally Normal                       Personal Care Assistance Level of Assistance  Bathing, Feeding, Dressing Bathing Assistance: Maximum assistance Feeding assistance: Limited assistance Dressing Assistance: Maximum assistance     Functional Limitations Info  Sight, Speech, Hearing Sight Info: Adequate Hearing Info: Adequate Speech Info: Adequate    SPECIAL CARE FACTORS FREQUENCY  PT (By licensed PT), OT (By licensed OT)     PT Frequency: 5 times week OT Frequency: 3 times week            Contractures Contractures Info: Not present    Additional Factors Info  Code Status, Allergies Code Status Info: DNR Allergies Info: NKA           Current Medications (  03/04/2019):  This is the current hospital active medication list Current Facility-Administered Medications  Medication Dose Route Frequency Provider Last Rate Last Dose  . acetaminophen (TYLENOL) suppository 650 mg  650 mg Rectal Q6H PRN Johnson, Clanford L, MD      . albuterol (PROVENTIL) (2.5 MG/3ML) 0.083% nebulizer solution 2.5 mg  2.5 mg Nebulization Q4H PRN Johnson, Clanford L, MD      .  atorvastatin (LIPITOR) tablet 80 mg  80 mg Oral Daily Johnson, Clanford L, MD   80 mg at 03/04/19 1044  . bictegravir-emtricitabine-tenofovir AF (BIKTARVY) 50-200-25 MG per tablet 1 tablet  1 tablet Oral Daily Johnson, Clanford L, MD   1 tablet at 03/04/19 1044  . chlorhexidine (PERIDEX) 0.12 % solution 15 mL  15 mL Mouth Rinse BID Johnson, Clanford L, MD   15 mL at 03/04/19 1110  . cholecalciferol (VITAMIN D3) tablet 2,000 Units  2,000 Units Oral Daily Oran Rein, MD   2,000 Units at 03/04/19 1044  . dextrose 5 %-0.9 % sodium chloride infusion   Intravenous Continuous Johnson, Clanford L, MD 65 mL/hr at 03/04/19 0755    . docusate sodium (COLACE) capsule 100 mg  100 mg Oral Daily Oran Rein, MD   100 mg at 03/04/19 1044  . fluticasone furoate-vilanterol (BREO ELLIPTA) 100-25 MCG/INH 1 puff  1 puff Inhalation Daily Oran Rein, MD   1 puff at 03/04/19 0827  . MEDLINE mouth rinse  15 mL Mouth Rinse q12n4p Johnson, Clanford L, MD   15 mL at 03/04/19 1111  . metoprolol tartrate (LOPRESSOR) injection 2.5 mg  2.5 mg Intravenous Q6H Johnson, Clanford L, MD   2.5 mg at 03/04/19 1409  . multivitamin with minerals tablet 1 tablet  1 tablet Oral Daily Oran Rein, MD      . pantoprazole (PROTONIX) injection 40 mg  40 mg Intravenous Q24H Johnson, Clanford L, MD   40 mg at 03/04/19 0907  . senna-docusate (Senokot-S) tablet 1 tablet  1 tablet Oral QHS PRN Johnson, Clanford L, MD      . tamsulosin (FLOMAX) capsule 0.4 mg  0.4 mg Oral Daily Johnson, Clanford L, MD   0.4 mg at 03/04/19 1044     Discharge Medications: Please see discharge summary for a list of discharge medications.  Relevant Imaging Results:  Relevant Lab Results:   Additional Information SSN: [place]  Shade Flood, LCSW

## 2019-03-04 NOTE — Consult Note (Signed)
Combine A. Merlene Laughter, MD     [email or web address]          Keith Young is an 68 y.o. male.   ASSESSMENT/PLAN: 1.  Syncope/unresponsiveness: Etiologies includes seizures and stroke.  An EEG is pending. 2.  Focal neurological deficit likely due to acute ischemic stroke.  Risk factors include age, previous infarct, diabetes, low ejection fraction and hypertension.  The patient has been started on aspirin in addition to his baseline warfarin. 3.  Previous infarct 4.  Right extracranial left ICA stenosis    The patient is 68 year old black male who has a history of end-stage renal disease.  He was undergoing hemodialysis when he became unresponsive.  The work-up has been mostly unrevealing so far.  The patient seems confused and unable to give a history.  He knows he is in the hospital but cannot state why.  He reports no complaints at this time.     GENERAL: The patient is laying in bed with eyes closed.  HEENT: Supple no trauma appreciated  ABDOMEN: soft  EXTREMITIES: There is marked edema of the left upper extremity.  BACK: Normal  SKIN: Normal by inspection.    MENTAL STATUS: The patient opens his eyes and cooperates with evaluation.  He knows he is in the hospital but is unable to state why he is in the hospital.  He is not able to provide an adequate history.  He does not know his age or the month.  There is a mark dysarthria.  CRANIAL NERVES: Pupils are equal, round and reactive to light; extra ocular movements are full, there is no significant nystagmus; visual fields are full; there is a significant weakness of the lower facial muscles on the left side associated with the flattening of the nasolabial fold. The tongue is midline MOTOR: Strength in all 4 extremities is graded as 4-/5.  There is significant drift in all 4 extremities.  COORDINATION: Left finger to nose is normal, right finger to nose is normal, No rest tremor; no intention tremor; no  postural tremor; no bradykinesia.  REFLEXES: Deep tendon reflexes are symmetrical and normal.   SENSATION: Normal pain.       Blood pressure 94/72, pulse 82, temperature 97.8 F (36.6 C), temperature source Oral, resp. rate 18, height 5\' 3"  (1.6 m), weight 68 kg, SpO2 100 %.  Past Medical History:  Diagnosis Date   AICD (automatic cardioverter/defibrillator) present    Alcoholism in remission ([place])    Anemia    Aortic insufficiency    Arthritis    At moderate risk for fall    Bell's palsy    Chronic systolic heart failure ([place])    a. EF previously 15-20% with cath in 2014 showing no significant CAD b. EF 20-25% by echo in 06/2018   CKD (chronic kidney disease), stage IV (HCC)    COPD (chronic obstructive pulmonary disease) ([place])    Essential hypertension, benign    HIV disease ([place]) 09/06/2016   Hyperlipidemia    NICM (nonischemic cardiomyopathy) ([place])    Noncompliance with medication regimen    NSVT (nonsustained ventricular tachycardia) (HCC)    Numbness of right jaw    Had a stoke in 01/2013. Numbness is occasional, especially when trying to chew.   OSA (obstructive sleep apnea)    Productive cough 08/2013   With brown sputum    S/P aortic valve replacement with bioprosthetic valve    Stroke ([place]) 01/2013   weakness of right side from CVA  Type 2 diabetes mellitus ([place])    Type 2 diabetes mellitus with hypertension and end stage renal disease on dialysis ([place]) 07/31/2018   Uses hearing aid 2014   recently received new hearing aids    Past Surgical History:  Procedure Laterality Date   AV FISTULA PLACEMENT Left 07/20/2018   Procedure: INSERTION ARTERIOVENOUS GORTEX GRAFT  LEFT ARM;  Surgeon: Rosetta Posner, MD;  Location: [facility];  Service: Vascular;  Laterality: Left;   BIOPSY N/A 04/10/2014   Procedure: GASTRIC BIOPSY;  Surgeon: Daneil Dolin, MD;  Location: [facility];  Service: Endoscopy;  Laterality: N/A;   BIOPSY  10/12/2015   Procedure:  BIOPSY;  Surgeon: Daneil Dolin, MD;  [facility];  Service: Endoscopy;;  stomach bx's   CARDIAC DEFIBRILLATOR PLACEMENT  11/12/12   [employer] Inogen MINI ICD implanted in [place] at [facility] per Dr Nelly Laurence note   COLONOSCOPY WITH PROPOFOL N/A 04/10/2014   RMR: Colonic Diverticulosis   ESOPHAGOGASTRODUODENOSCOPY (EGD) WITH PROPOFOL N/A 04/10/2014   RMR: Mild erosive reflux esophagitis. Multiple antral polyps likely hyperplastic status post removal by hot snare cautery technique. Diffusely abnormal stomach status post gastric biopsy I suspect some of patients anemaia may be due to intermittent oozing from the stomach. It would be difficult and a risky proposition to attempt complete removal of all of his gastric polyps.   ESOPHAGOGASTRODUODENOSCOPY (EGD) WITH PROPOFOL N/A 10/12/2015   Procedure: ESOPHAGOGASTRODUODENOSCOPY (EGD) WITH PROPOFOL;  Surgeon: Daneil Dolin, MD;  [facility];  Service: Endoscopy;  Laterality: N/A;  [id number] - moved to [place] Right 07/20/2018   Procedure: EXCHANGE OF DIALYSIS CATHETER RIGHT INTERNAL JUGULAR;  Surgeon: Rosetta Posner, MD;  Location: [facility];  Service: Vascular;  Laterality: Right;   IR FLUORO GUIDE CV LINE RIGHT  07/09/2018   IR US GUIDE VASC ACCESS RIGHT  07/09/2018   POLYPECTOMY N/A 04/10/2014   Procedure: GASTRIC POLYPECTOMY;  Surgeon: Daneil Dolin, MD;  [facility];  Service: Endoscopy;  Laterality: N/A;   RIGHT HEART CATHETERIZATION N/A 02/06/2014   Procedure: RIGHT HEART CATH;  Surgeon: Larey Dresser, MD;  Location: [facility] C[facility];  Service: Cardiovascular;  Laterality: N/A;    Family History  Problem Relation Age of Onset   Kidney disease Mother    Kidney disease Sister    Colon cancer Neg Hx     Social History:  reports that he quit smoking about 25 years ago. His smoking use included cigarettes. He has never used  smokeless tobacco. He reports previous alcohol use. He reports that he does not use drugs.  Allergies: No Known Allergies  Medications: Prior to Admission medications   Medication Sig Start Date End Date Taking? Authorizing Provider  atorvastatin (LIPITOR) 80 MG tablet Take 80 mg by mouth daily.  11/22/16  Yes [provider]  bictegravir-emtricitabine-tenofovir AF (BIKTARVY) 50-200-25 MG TABS tablet Take 1 tablet by mouth daily. 04/18/18  Yes Comer, Okey Regal, MD  calcium carbonate (TUMS EX) 750 MG chewable tablet Chew 1 tablet by mouth 2 (two) times daily. 07/30/18  Yes [provider]  Cholecalciferol (VITAMIN D3) 2000 UNITS TABS Take 2,000 mg by mouth daily.   Yes [provider]  docusate sodium (COLACE) 100 MG capsule Take 100 mg by mouth daily.   Yes [provider]  Fluticasone-Salmeterol (ADVAIR) 100-50 MCG/DOSE AEPB Inhale 1 puff into the lungs  daily. 12/04/18  Yes [provider]  metoprolol succinate (TOPROL-XL) 25 MG 24 hr tablet Take 1 tablet (25 mg total) by mouth daily. 08/08/18  Yes Strader, [place] M, PA-C  midodrine (PROAMATINE) 10 MG tablet Take 10 mg by mouth. Send medication with resident to be given at center. Once a day on Tues.,Thurs., Sat.   Yes [provider]  multivitamin (RENA-VIT) TABS tablet Take 1 tablet by mouth at bedtime. 07/26/18  Yes Ghimire, Henreitta Leber, MD  omeprazole (PRILOSEC) 20 MG capsule Take 20 mg by mouth at bedtime. 09/04/18  Yes [provider]  senna-docusate (SENEXON-S) 8.6-50 MG tablet Take 1 tablet by mouth daily.   Yes [provider]  tamsulosin (FLOMAX) 0.4 MG CAPS capsule Take 0.4 mg by mouth daily.   Yes [provider]  warfarin (COUMADIN) 1 MG tablet Take 1 mg by mouth daily. Give along with 2.5 mg tab for total of 3.5 daily   Yes [provider]  warfarin (COUMADIN) 2.5 MG tablet Take 2.5 mg by mouth daily. Give along with 1 mg tab for total of 3.5 mg daily    Yes [provider]  acetaminophen (TYLENOL) 325 MG tablet Take 2 tablets (650 mg total) by mouth every 4 (four) hours as needed for mild pain (or temp > 37.5 C (99.5 F)). 06/30/16   Sinda Du, MD  albuterol (PROVENTIL) (2.5 MG/3ML) 0.083% nebulizer solution Take 3 mLs (2.5 mg total) by nebulization every 4 (four) hours as needed for wheezing or shortness of breath. 07/26/18   Ghimire, Henreitta Leber, MD  coal tar (NEUTROGENA T-GEL) 0.5 % shampoo Apply to scalp on shower days (Twice a week) for dandruff and dry scalp Wed., and Sat.    [provider]  NON FORMULARY Diet Type:  Dysphagia 2 with thin liquids (NO Straws) 11/19/18   [provider]  Nutritional Supplements (FEEDING SUPPLEMENT, NEPRO CARB STEADY,) LIQD Take by mouth daily. 11/20/18   [provider]  UNABLE TO FIND FLUID RESTRICTION 1200 CC/ 24hrs. Breakfast (12oz) 360 ml lunch (8oz) 240 ml Dinner (12oz) 360 ml Every Shift Day, Evening, Night 09/06/18   [provider]  ZINC OXIDE EX Apply Zinc oxide to bilateral buttocks area for irritation. Every Shift Day, Day, Day    [provider]    Scheduled Meds:  atorvastatin  80 mg Oral Daily   bictegravir-emtricitabine-tenofovir AF  1 tablet Oral Daily   chlorhexidine  15 mL Mouth Rinse BID   cholecalciferol  2,000 Units Oral Daily   docusate sodium  100 mg Oral Daily   fluticasone furoate-vilanterol  1 puff Inhalation Daily   mouth rinse  15 mL Mouth Rinse q12n4p   [START ON 03/05/2019] metoprolol succinate  25 mg Oral Daily   metoprolol tartrate  2.5 mg Intravenous Q6H   [START ON 03/05/2019] midodrine  10 mg Oral Q T,Th,Sa-HD   multivitamin with minerals  1 tablet Oral Daily   [START ON 03/05/2019] pantoprazole  40 mg Oral Daily   tamsulosin  0.4 mg Oral Daily   Continuous Infusions: PRN Meds:.acetaminophen, albuterol, senna-docusate     Results for orders placed or performed during the hospital encounter of  03/02/19 (from the past 48 hour(s))  SARS CORONAVIRUS 2 (TAT 6-24 HRS) Nasopharyngeal Nasopharyngeal Swab     Status: None   Collection Time: 03/02/19  7:35 PM   Specimen: Nasopharyngeal Swab  Result Value Ref Range   SARS Coronavirus 2 NEGATIVE NEGATIVE    Comment: (NOTE) SARS-CoV-2  target nucleic acids are NOT DETECTED. The SARS-CoV-2 RNA is generally detectable in upper and lower respiratory specimens during the acute phase of infection. Negative results do not preclude SARS-CoV-2 infection, do not rule out co-infections with other pathogens, and should not be used as the sole basis for treatment or other patient management decisions. Negative results must be combined with clinical observations, patient history, and epidemiological information. The expected result is Negative. Fact Sheet for Patients: SugarRoll.be Fact Sheet for Healthcare Providers: https://www.woods-mathews.com/ This test is not yet approved or cleared by the [place] FDA and  has been authorized for detection and/or diagnosis of SARS-CoV-2 by FDA under an Emergency Use Authorization (EUA). This EUA will remain  in effect (meaning this test can be used) for the duration of the COVID-19 declaration under Section 56 4(b)(1) of the Act, 21 U.S.C. section 360bbb-3(b)(1), unless the authorization is terminated or revoked sooner. Performed at [facility], [place] [address] [place], [place] [address]   MRSA PCR Screening     Status: None   Collection Time: 03/02/19 11:39 PM   Specimen: Nasopharyngeal  Result Value Ref Range   MRSA by PCR NEGATIVE NEGATIVE    Comment:        The GeneXpert MRSA Assay (FDA approved for NASAL specimens only), is one component of a comprehensive MRSA colonization surveillance program. It is not intended to diagnose MRSA infection nor to guide or monitor treatment for MRSA infections. Performed at [facility], [address] [place], [place] [address]   Comprehensive metabolic panel     Status: Abnormal   Collection Time: 03/03/19  6:00 AM  Result Value Ref Range   Sodium 138 135 - 145 mmol/L   Potassium 3.4 (L) 3.5 - 5.1 mmol/L   Chloride 99 98 - 111 mmol/L   CO2 26 22 - 32 mmol/L   Glucose, Bld 65 (L) 70 - 99 mg/dL   BUN 24 (H) 8 - 23 mg/dL   Creatinine, Ser 3.13 (H) 0.61 - 1.24 mg/dL   Calcium 8.6 (L) 8.9 - 10.3 mg/dL   Total Protein 6.6 6.5 - 8.1 g/dL   Albumin 2.9 (L) 3.5 - 5.0 g/dL   AST 42 (H) 15 - 41 U/L   ALT 40 0 - 44 U/L   Alkaline Phosphatase 150 (H) 38 - 126 U/L   Total Bilirubin 2.1 (H) 0.3 - 1.2 mg/dL   GFR calc non Af Amer 20 (L) >60 mL/min   GFR calc Af Amer 23 (L) >60 mL/min   Anion gap 13 5 - 15    Comment: Performed at [facility], [address] [place], [place] [address]  Protime-INR     Status: Abnormal   Collection Time: 03/03/19  6:00 AM  Result Value Ref Range   Prothrombin Time 23.2 (H) 11.4 - 15.2 seconds   INR 2.1 (H) 0.8 - 1.2    Comment: (NOTE) INR goal varies based on device and disease states. Performed at [facility], [address], [place], [place] [address]   Lipid panel     Status: Abnormal   Collection Time: 03/03/19  6:00 AM  Result Value Ref Range   Cholesterol 113 0 - 200 mg/dL   Triglycerides 71 <150 mg/dL   HDL 31 (L) >40 mg/dL   Total CHOL/HDL Ratio 3.6 RATIO   VLDL 14 0 - 40 mg/dL   LDL Cholesterol 68 0 - 99 mg/dL    Comment:        Total  Cholesterol/HDL:CHD Risk Coronary Heart Disease Risk Table                     Men   Women  1/2 Average Risk   3.4   3.3  Average Risk       5.0   4.4  2 X Average Risk   9.6   7.1  3 X Average Risk  23.4   11.0        Use the calculated Patient Ratio above and the CHD Risk Table to determine the patient's CHD Risk.        ATP III CLASSIFICATION (LDL):  <100     mg/dL   Optimal  100-129  mg/dL   Near or Above                    Optimal  130-159  mg/dL   Borderline  160-189  mg/dL   High  >190      mg/dL   Very High Performed at [place] [place], [place] [address]   Glucose, capillary     Status: Abnormal   Collection Time: 03/03/19  8:00 AM  Result Value Ref Range   Glucose-Capillary 59 (L) 70 - 99 mg/dL   Comment 1 Notify RN   Glucose, capillary     Status: Abnormal   Collection Time: 03/03/19  8:59 AM  Result Value Ref Range   Glucose-Capillary 65 (L) 70 - 99 mg/dL   Comment 1 Notify RN    Comment 2 Document in Chart   Glucose, capillary     Status: Abnormal   Collection Time: 03/03/19 11:01 AM  Result Value Ref Range   Glucose-Capillary 65 (L) 70 - 99 mg/dL   Comment 1 Notify RN    Comment 2 Document in Chart   Glucose, capillary     Status: Abnormal   Collection Time: 03/03/19 11:45 AM  Result Value Ref Range   Glucose-Capillary 114 (H) 70 - 99 mg/dL   Comment 1 Notify RN   Glucose, capillary     Status: None   Collection Time: 03/03/19  5:38 PM  Result Value Ref Range   Glucose-Capillary 88 70 - 99 mg/dL  Glucose, capillary     Status: None   Collection Time: 03/04/19 12:01 AM  Result Value Ref Range   Glucose-Capillary 84 70 - 99 mg/dL  CBC     Status: Abnormal   Collection Time: 03/04/19  5:11 AM  Result Value Ref Range   WBC 2.7 (L) 4.0 - 10.5 K/uL   RBC 4.02 (L) 4.22 - 5.81 MIL/uL   Hemoglobin 12.7 (L) 13.0 - 17.0 g/dL   HCT 38.2 (L) 39.0 - 52.0 %   MCV 95.0 80.0 - 100.0 fL   MCH 31.6 26.0 - 34.0 pg   MCHC 33.2 30.0 - 36.0 g/dL   RDW 21.5 (H) 11.5 - 15.5 %   Platelets 79 (L) 150 - 400 K/uL    Comment: SPECIMEN CHECKED FOR CLOTS Immature Platelet Fraction may be clinically indicated, consider ordering this additional test [id number] CONSISTENT WITH PREVIOUS RESULT    nRBC 0.0 0.0 - 0.2 %    Comment: Performed at [facility], [address] [place], [place] [address]  Basic metabolic panel     Status: Abnormal   Collection Time: 03/04/19  5:11 AM  Result Value Ref Range   Sodium 138 135 - 145 mmol/L   Potassium 4.0 3.5 - 5.1  mmol/L  Chloride 100 98 - 111 mmol/L   CO2 24 22 - 32 mmol/L   Glucose, Bld 80 70 - 99 mg/dL   BUN 30 (H) 8 - 23 mg/dL   Creatinine, Ser 3.96 (H) 0.61 - 1.24 mg/dL   Calcium 8.8 (L) 8.9 - 10.3 mg/dL   GFR calc non Af Amer 15 (L) >60 mL/min   GFR calc Af Amer 17 (L) >60 mL/min   Anion gap 14 5 - 15    Comment: Performed at [facility], [address] [place], [place] [address]  Magnesium     Status: None   Collection Time: 03/04/19  5:11 AM  Result Value Ref Range   Magnesium 2.1 1.7 - 2.4 mg/dL    Comment: Performed at [facility], [address] [place], [place] [address]  Phosphorus     Status: None   Collection Time: 03/04/19  5:11 AM  Result Value Ref Range   Phosphorus 3.9 2.5 - 4.6 mg/dL    Comment: Performed at [facility], [address], [place], [place] [address]  Glucose, capillary     Status: None   Collection Time: 03/04/19  6:06 AM  Result Value Ref Range   Glucose-Capillary 79 70 - 99 mg/dL  Protime-INR     Status: Abnormal   Collection Time: 03/04/19  7:32 AM  Result Value Ref Range   Prothrombin Time 26.3 (H) 11.4 - 15.2 seconds   INR 2.5 (H) 0.8 - 1.2    Comment: (NOTE) INR goal varies based on device and disease states. Performed at [facility], [address], [place], [place] [address]   Glucose, capillary     Status: None   Collection Time: 03/04/19 11:42 AM  Result Value Ref Range   Glucose-Capillary 84 70 - 99 mg/dL   Comment 1 Notify RN   Glucose, capillary     Status: None   Collection Time: 03/04/19  5:02 PM  Result Value Ref Range   Glucose-Capillary 92 70 - 99 mg/dL   Comment 1 Notify RN    Comment 2 Document in Chart     Studies/Results:   HEAD NECK CTA FINDINGS: CTA NECK FINDINGS  Aortic arch: Standard branching. Imaged portions of the aortic arch demonstrate no evidence of dissection or aneurysm. Atherosclerotic calcification within the visualized aortic arch and proximal major branch vessels of the neck.  Right carotid  system: CCA and ICA patent within the neck without significant stenosis (50% or greater). Atherosclerosis at the carotid bifurcation and within the proximal right ICA with less than 50% luminal narrowing of the proximal ICA. Distal to this, the ICA is patent within the neck without significant stenosis.  Left carotid system: Scattered plaque within the common carotid artery without significant stenosis (50% or greater). Prominent plaque at the carotid bifurcation and within the proximal ICA. Approximately 60-70% stenosis within the proximal ICA. Distal to this, the ICAs patent within the neck without significant stenosis.  Vertebral arteries: The right vertebral artery is patent within the neck without significant stenosis (50% or greater. Plaque at the origin of the dominant left vertebral artery results in mild-to-moderate ostial stenosis. Distal to this, the left vertebral artery is patent within the neck without significant stenosis.  Skeleton: Reversal of the expected cervical lordosis. Cervical spondylosis greatest at C6-C7. Diffusely heterogeneous appearance of the osseous structures likely reflecting renal osteodystrophy.  Other neck: Generalized edema consistent with anasarca.  Upper chest: Partially imaged bilateral pleural effusions. The right pleural effusion may be loculated. Sequela  of prior median sternotomy. Partially visualized pacer leads.  Review of the MIP images confirms the above findings  CTA HEAD FINDINGS  Anterior circulation:  Atherosclerotic calcification within the intracranial internal carotid arteries bilaterally with regions of mild luminal stenosis.  The right middle and anterior cerebral arteries are patent without significant proximal stenosis.  The left middle and anterior cerebral arteries are patent without significant proximal stenosis.  Posterior circulation:  The intracranial vertebral arteries are patent without  significant stenosis.  The basilar artery is patent without significant proximal stenosis.  The right posterior cerebral artery is patent. Mild focal stenosis within the right PCA at the P1-P2 junction. Moderate focal stenosis within the P2 left PCA.  Venous sinuses: Within limitations of contrast timing, no convincing thrombus.  Anatomic variants: None significant  Review of the MIP images confirms the above findings  These results were called by telephone at the time of interpretation on 03/02/2019 at 5:19 pm to provider [facility] , who verbally acknowledged these results.  IMPRESSION: CTA head:  1. No intracranial large vessel occlusion. 2. Moderate focal stenosis within the P2 left PCA. 3. Mild focal stenosis within the right PCA at the P1-P2 junction. 4. Additional intracranial atherosclerotic disease without significant stenosis as described.  CTA neck:  1. Atherosclerotic disease within the imaged aortic arch and major branch vessels of the neck as detailed and most notably as follows. 2. Approximately 60-70% stenosis of the proximal left ICA. 3. Less than 50% stenosis of the proximal right ICA. 4. Mild/moderate ostial stenosis of the dominant left vertebral artery. The bilateral vertebral arteries are otherwise patent within the neck without significant stenosis. 5. Anasarca. Partially visualized bilateral pleural effusions. The right pleural effusion may be loculated. 6. Sequela of renal osteodystrophy.      Emersyn Kotarski A. Merlene Laughter, M.D.  Diplomate, Tax adviser of Psychiatry and Neurology ( Neurology). 03/04/2019, 6:00 PM

## 2019-03-04 NOTE — Progress Notes (Signed)
PROGRESS NOTE    Keith Young  JWJ:[id number]  DOB: 03/15/1951  DOA: 03/02/2019 PCP: [name], MD  Brief Admission Hx: 68 year old male sent to ED after he was found unresponsive after hemodialysis at the [facility].  He was seen by the tele-neurologist who was concerned for stroke.  He was not a candidate for TPA given that he was therapeutic on warfarin.  MRI brain was recommended.  MDM/Assessment & Plan:   1. Question of CVA -patient has a pronounced facial droop and has failed a swallow screen however he has a history of Bells' Palsy.  Rule out for acute cerebrovascular accident.  Unfortunately we do not have MRI available at this hospital due to his ICD.  We have called [facility] and they plan to do his MRI tomorrow at 2pm.  Continue stroke work-up as ordered.  Continue aspirin rectal suppository.  SLP evaluation PT OT evaluation and echocardiogram ordered.  Holding warfarin until after MRI per tele-neurologist recommendations.  Check EEG.  Inpatient neurology consult.   Continue neurochecks.  Continue supportive therapy. 2. Hypoglycemia-likely from being n.p.o.  I placed him on a dextrose infusion but will back off now that BS have improved. 3. End-stage renal disease on hemodialysis TTS-will consult nephrology as he will require dialysis tomorrow (Tuesday). 4. Ischemic cardiomyopathy-patient has an ICD in place in 2014. Also may need to have device interrogated to see if that may be the reason he was found unresponsive after his HD treatment. 5. PAF-he has been maintained on warfarin for his bioprosthetic AVR and for stroke prevention.  His INR today is 2.5.  Tele-neurology asked to hold warfarin until after the MRI is been completed. 6. Essential hypertension- he remains n.p.o. due to failing swallow screen.  I have changed his metoprolol to 2.5 mg IV every 6 hours with holding parameters.  He does take Midrin on dialysis days. 7. HIV- he has been maintained on  HAART.  8. COPD - stable, resumed home bronchodilators.  9. Type 2 DM - he is hypoglycemic now, started on dextrose.    DVT prophylaxis: warfarin Code Status: DNR  Family Communication: patient updated bedside Disposition Plan: inpatient  Consultants:  Neurology   Procedures:  n/a  Antimicrobials:  n/a   Subjective: Pt has no complaints today.   Objective: Vitals:   03/04/19 0313 03/04/19 0730 03/04/19 0827 03/04/19 1130  BP: 102/81 110/87  100/70  Pulse: 88 86  84  Resp: 17 20  20   Temp: 97.6 F (36.4 C) 98.5 F (36.9 C)  97.6 F (36.4 C)  TempSrc: Oral Oral  Axillary  SpO2: 97% 100% 91% 97%  Weight:      Height:        Intake/Output Summary (Last 24 hours) at 03/04/2019 1250 Last data filed at 03/04/2019 0500 Gross per 24 hour  Intake 1037.03 ml  Output --  Net 1037.03 ml   Filed Weights   03/02/19 1538  Weight: 68 kg   REVIEW OF SYSTEMS  As per history otherwise all reviewed and reported negative  Exam:  General exam: awake, alert, oriented to person, pronounced facial droop, slightly slurred speech. Respiratory system:  No increased work of breathing. Cardiovascular system: irregularly irregular S1 & S2 heard. No JVD, murmurs, gallops, clicks or pedal edema. Gastrointestinal system: Abdomen is nondistended, soft and nontender. Normal bowel sounds heard. Central nervous system: Alert, with pronounced facial droop. Slightly diminished LUE strength otherwise no deficits. Extremities: no CCE.  Data Reviewed: Basic Metabolic  Panel: Recent Labs  Lab 03/02/19 1534 03/03/19 0600 03/04/19 0511  NA 136 138 138  K 3.1* 3.4* 4.0  CL 98 99 100  CO2 26 26 24   GLUCOSE 106* 65* 80  BUN 19 24* 30*  CREATININE 2.53* 3.13* 3.96*  CALCIUM 8.3* 8.6* 8.8*  MG  --   --  2.1  PHOS  --   --  3.9   Liver Function Tests: Recent Labs  Lab 03/02/19 1534 03/03/19 0600  AST 47* 42*  ALT 45* 40  ALKPHOS 161* 150*  BILITOT 1.8* 2.1*  PROT 7.0 6.6  ALBUMIN  2.9* 2.9*   No results for input(s): LIPASE, AMYLASE in the last 168 hours. No results for input(s): AMMONIA in the last 168 hours. CBC: Recent Labs  Lab 03/02/19 1534 03/04/19 0511  WBC 2.4* 2.7*  NEUTROABS 1.6*  --   HGB 12.4* 12.7*  HCT 36.3* 38.2*  MCV 92.6 95.0  PLT 79* 79*   Cardiac Enzymes: No results for input(s): CKTOTAL, CKMB, CKMBINDEX, TROPONINI in the last 168 hours. CBG (last 3)  Recent Labs    03/04/19 0001 03/04/19 0606 03/04/19 1142  GLUCAP 84 79 84   Recent Results (from the past 240 hour(s))  SARS CORONAVIRUS 2 (TAT 6-24 HRS) Nasopharyngeal Nasopharyngeal Swab     Status: None   Collection Time: 03/02/19  7:35 PM   Specimen: Nasopharyngeal Swab  Result Value Ref Range Status   SARS Coronavirus 2 NEGATIVE NEGATIVE Final    Comment: (NOTE) SARS-CoV-2 target nucleic acids are NOT DETECTED. The SARS-CoV-2 RNA is generally detectable in upper and lower respiratory specimens during the acute phase of infection. Negative results do not preclude SARS-CoV-2 infection, do not rule out co-infections with other pathogens, and should not be used as the sole basis for treatment or other patient management decisions. Negative results must be combined with clinical observations, patient history, and epidemiological information. The expected result is Negative. Fact Sheet for Patients: SugarRoll.be Fact Sheet for Healthcare Providers: https://www.woods-mathews.com/ This test is not yet approved or cleared by the [place] FDA and  has been authorized for detection and/or diagnosis of SARS-CoV-2 by FDA under an Emergency Use Authorization (EUA). This EUA will remain  in effect (meaning this test can be used) for the duration of the COVID-19 declaration under Section 56 4(b)(1) of the Act, 21 U.S.C. section 360bbb-3(b)(1), unless the authorization is terminated or revoked sooner. Performed at [facility],  [place] [address] [place], [place] [address]   MRSA PCR Screening     Status: None   Collection Time: 03/02/19 11:39 PM   Specimen: Nasopharyngeal  Result Value Ref Range Status   MRSA by PCR NEGATIVE NEGATIVE Final    Comment:        The GeneXpert MRSA Assay (FDA approved for NASAL specimens only), is one component of a comprehensive MRSA colonization surveillance program. It is not intended to diagnose MRSA infection nor to guide or monitor treatment for MRSA infections. Performed at [facility], [address] [place], [place] [address]      Studies: Ct Angio Head W Or Wo Contrast  Result Date: 03/02/2019 CLINICAL DATA:  Focal neuro deficit, less than 6 hours, stroke suspected. Additional history provided: Patient found unresponsive in wheelchair after returning from dialysis. Left-sided facial droop and slurred speech. Left grip weakness. EXAM: CT ANGIOGRAPHY HEAD AND NECK TECHNIQUE: Multidetector CT imaging of the head and neck was performed using the standard protocol during bolus administration of intravenous  contrast. Multiplanar CT image reconstructions and MIPs were obtained to evaluate the vascular anatomy. Carotid stenosis measurements (when applicable) are obtained utilizing NASCET criteria, using the distal internal carotid diameter as the denominator. CONTRAST:  40mL OMNIPAQUE IOHEXOL 350 MG/ML SOLN COMPARISON:  None. Noncontrast head CT 03/02/2019, carotid artery duplex 06/27/2016 FINDINGS: CTA NECK FINDINGS Aortic arch: Standard branching. Imaged portions of the aortic arch demonstrate no evidence of dissection or aneurysm. Atherosclerotic calcification within the visualized aortic arch and proximal major branch vessels of the neck. Right carotid system: CCA and ICA patent within the neck without significant stenosis (50% or greater). Atherosclerosis at the carotid bifurcation and within the proximal right ICA with less than 50% luminal narrowing of the proximal ICA. Distal to  this, the ICA is patent within the neck without significant stenosis. Left carotid system: Scattered plaque within the common carotid artery without significant stenosis (50% or greater). Prominent plaque at the carotid bifurcation and within the proximal ICA. Approximately 60-70% stenosis within the proximal ICA. Distal to this, the ICAs patent within the neck without significant stenosis. Vertebral arteries: The right vertebral artery is patent within the neck without significant stenosis (50% or greater. Plaque at the origin of the dominant left vertebral artery results in mild-to-moderate ostial stenosis. Distal to this, the left vertebral artery is patent within the neck without significant stenosis. Skeleton: Reversal of the expected cervical lordosis. Cervical spondylosis greatest at C6-C7. Diffusely heterogeneous appearance of the osseous structures likely reflecting renal osteodystrophy. Other neck: Generalized edema consistent with anasarca. Upper chest: Partially imaged bilateral pleural effusions. The right pleural effusion may be loculated. Sequela of prior median sternotomy. Partially visualized pacer leads. Review of the MIP images confirms the above findings CTA HEAD FINDINGS Anterior circulation: Atherosclerotic calcification within the intracranial internal carotid arteries bilaterally with regions of mild luminal stenosis. The right middle and anterior cerebral arteries are patent without significant proximal stenosis. The left middle and anterior cerebral arteries are patent without significant proximal stenosis. Posterior circulation: The intracranial vertebral arteries are patent without significant stenosis. The basilar artery is patent without significant proximal stenosis. The right posterior cerebral artery is patent. Mild focal stenosis within the right PCA at the P1-P2 junction. Moderate focal stenosis within the P2 left PCA. Venous sinuses: Within limitations of contrast timing, no  convincing thrombus. Anatomic variants: None significant Review of the MIP images confirms the above findings These results were called by telephone at the time of interpretation on 03/02/2019 at 5:19 pm to provider [facility] , who verbally acknowledged these results. IMPRESSION: CTA head: 1. No intracranial large vessel occlusion. 2. Moderate focal stenosis within the P2 left PCA. 3. Mild focal stenosis within the right PCA at the P1-P2 junction. 4. Additional intracranial atherosclerotic disease without significant stenosis as described. CTA neck: 1. Atherosclerotic disease within the imaged aortic arch and major branch vessels of the neck as detailed and most notably as follows. 2. Approximately 60-70% stenosis of the proximal left ICA. 3. Less than 50% stenosis of the proximal right ICA. 4. Mild/moderate ostial stenosis of the dominant left vertebral artery. The bilateral vertebral arteries are otherwise patent within the neck without significant stenosis. 5. Anasarca. Partially visualized bilateral pleural effusions. The right pleural effusion may be loculated. 6. Sequela of renal osteodystrophy. Electronically Signed   By: Kellie Simmering   On: 03/02/2019 17:19   Ct Angio Neck W And/or Wo Contrast  Result Date: 03/02/2019 CLINICAL DATA:  Focal neuro deficit, less than 6 hours, stroke suspected. Additional  history provided: Patient found unresponsive in wheelchair after returning from dialysis. Left-sided facial droop and slurred speech. Left grip weakness. EXAM: CT ANGIOGRAPHY HEAD AND NECK TECHNIQUE: Multidetector CT imaging of the head and neck was performed using the standard protocol during bolus administration of intravenous contrast. Multiplanar CT image reconstructions and MIPs were obtained to evaluate the vascular anatomy. Carotid stenosis measurements (when applicable) are obtained utilizing NASCET criteria, using the distal internal carotid diameter as the denominator. CONTRAST:  83mL OMNIPAQUE  IOHEXOL 350 MG/ML SOLN COMPARISON:  None. Noncontrast head CT 03/02/2019, carotid artery duplex 06/27/2016 FINDINGS: CTA NECK FINDINGS Aortic arch: Standard branching. Imaged portions of the aortic arch demonstrate no evidence of dissection or aneurysm. Atherosclerotic calcification within the visualized aortic arch and proximal major branch vessels of the neck. Right carotid system: CCA and ICA patent within the neck without significant stenosis (50% or greater). Atherosclerosis at the carotid bifurcation and within the proximal right ICA with less than 50% luminal narrowing of the proximal ICA. Distal to this, the ICA is patent within the neck without significant stenosis. Left carotid system: Scattered plaque within the common carotid artery without significant stenosis (50% or greater). Prominent plaque at the carotid bifurcation and within the proximal ICA. Approximately 60-70% stenosis within the proximal ICA. Distal to this, the ICAs patent within the neck without significant stenosis. Vertebral arteries: The right vertebral artery is patent within the neck without significant stenosis (50% or greater. Plaque at the origin of the dominant left vertebral artery results in mild-to-moderate ostial stenosis. Distal to this, the left vertebral artery is patent within the neck without significant stenosis. Skeleton: Reversal of the expected cervical lordosis. Cervical spondylosis greatest at C6-C7. Diffusely heterogeneous appearance of the osseous structures likely reflecting renal osteodystrophy. Other neck: Generalized edema consistent with anasarca. Upper chest: Partially imaged bilateral pleural effusions. The right pleural effusion may be loculated. Sequela of prior median sternotomy. Partially visualized pacer leads. Review of the MIP images confirms the above findings CTA HEAD FINDINGS Anterior circulation: Atherosclerotic calcification within the intracranial internal carotid arteries bilaterally with  regions of mild luminal stenosis. The right middle and anterior cerebral arteries are patent without significant proximal stenosis. The left middle and anterior cerebral arteries are patent without significant proximal stenosis. Posterior circulation: The intracranial vertebral arteries are patent without significant stenosis. The basilar artery is patent without significant proximal stenosis. The right posterior cerebral artery is patent. Mild focal stenosis within the right PCA at the P1-P2 junction. Moderate focal stenosis within the P2 left PCA. Venous sinuses: Within limitations of contrast timing, no convincing thrombus. Anatomic variants: None significant Review of the MIP images confirms the above findings These results were called by telephone at the time of interpretation on 03/02/2019 at 5:19 pm to provider [facility] , who verbally acknowledged these results. IMPRESSION: CTA head: 1. No intracranial large vessel occlusion. 2. Moderate focal stenosis within the P2 left PCA. 3. Mild focal stenosis within the right PCA at the P1-P2 junction. 4. Additional intracranial atherosclerotic disease without significant stenosis as described. CTA neck: 1. Atherosclerotic disease within the imaged aortic arch and major branch vessels of the neck as detailed and most notably as follows. 2. Approximately 60-70% stenosis of the proximal left ICA. 3. Less than 50% stenosis of the proximal right ICA. 4. Mild/moderate ostial stenosis of the dominant left vertebral artery. The bilateral vertebral arteries are otherwise patent within the neck without significant stenosis. 5. Anasarca. Partially visualized bilateral pleural effusions. The right pleural effusion may  be loculated. 6. Sequela of renal osteodystrophy. Electronically Signed   By: Kellie Simmering   On: 03/02/2019 17:19   Ct Head Code Stroke Wo Contrast  Result Date: 03/02/2019 CLINICAL DATA:  Code stroke. 68 year old male found unresponsive after dialysis. Left  side weakness and slurred speech. EXAM: CT HEAD WITHOUT CONTRAST TECHNIQUE: Contiguous axial images were obtained from the base of the skull through the vertex without intravenous contrast. COMPARISON:  Head CT 07/11/2018 and earlier. FINDINGS: Brain: Chronic encephalomalacia at the confluence of the right posterior temporal and occipital lobes. Superimposed confluent and widespread bilateral cerebral white matter hypodensity. Superimposed chronic lacunar infarcts in the corona radiata and deep gray matter nuclei. Chronic lacunar infarct in the right paracentral pons. Small chronic cerebellar infarcts in both hemispheres. No midline shift, ventriculomegaly, mass effect, evidence of mass lesion, intracranial hemorrhage or evidence of cortically based acute infarction. Gray-white matter differentiation is within normal limits throughout the brain. Vascular: Calcified atherosclerosis at the skull base. No suspicious intracranial vascular hyperdensity. Skull: No acute osseous abnormality identified. Sinuses/Orbits: Paranasal sinuses have cleared since February. Mastoids remain clear. Other: No acute orbit or scalp soft tissue finding. ASPECTS [facility] Stroke Program Early CT Score) Total score (0-10 with 10 being normal): 10 (chronic cortical encephalomalacia on the right and stable appearing bilateral chronic small vessel disease. IMPRESSION: 1. No acute cortically based infarct or acute intracranial hemorrhage identified. 2. Stable non contrast CT appearance of advanced chronic ischemic disease since February. 3.  ASPECTS 10 (chronic encephalomalacia). Study discussed by telephone with Dr. Daleen Bo on 03/02/2019 at 15:46 . Electronically Signed   By: Genevie Ann M.D.   On: 03/02/2019 15:47   Scheduled Meds:  atorvastatin  80 mg Oral Daily   bictegravir-emtricitabine-tenofovir AF  1 tablet Oral Daily   chlorhexidine  15 mL Mouth Rinse BID   cholecalciferol  2,000 Units Oral Daily   docusate sodium  100 mg  Oral Daily   fluticasone furoate-vilanterol  1 puff Inhalation Daily   mouth rinse  15 mL Mouth Rinse q12n4p   metoprolol tartrate  2.5 mg Intravenous Q6H   multivitamin with minerals  1 tablet Oral Daily   pantoprazole (PROTONIX) IV  40 mg Intravenous Q24H   tamsulosin  0.4 mg Oral Daily   warfarin  3.5 mg Oral q1800   Warfarin - Physician Dosing Inpatient   Does not apply q1800   Continuous Infusions:  dextrose 5 % and 0.9% NaCl 65 mL/hr at 03/04/19 0755    Principal Problem:   CVA (cerebral vascular accident) [facility]) Active Problems:   Essential hypertension, benign   CAD (coronary artery disease)   ICD (implantable cardioverter-defibrillator) in place   HIV disease ([place])   COPD (chronic obstructive pulmonary disease) (HCC)   PAF (paroxysmal atrial fibrillation) ([place])   Type 2 diabetes mellitus with hypertension and end stage renal disease on dialysis ([place])   ESRD (end stage renal disease) on dialysis ([place])   Chronic anticoagulation   NSVT (nonsustained ventricular tachycardia) (HCC)   At moderate risk for fall   Bell's palsy  Time spent:   Irwin Brakeman, MD [facility] 03/04/2019, 12:50 PM    LOS: 2 days  How to contact the [facility] Attending or Consulting provider [place] or covering provider during after hours [place], for this patient?  1. Check the care team in [facility] and look for a) attending/consulting TRH provider listed and b) the [facility] team listed 2. Log into [email or web address] and use [place]'s universal password  to access. If you do not have the password, please contact the hospital operator. 3. Locate the [facility] provider you are looking for under [facility] and page to a number that you can be directly reached. 4. If you still have difficulty reaching the provider, please page the [facility] (Director on Call) for the Hospitalists listed on amion for assistance.

## 2019-03-04 NOTE — TOC Initial Note (Signed)
Transition of Care [facility]) - Initial/Assessment Note    Patient Details  Name: Keith Young MRN: [id number] Date of Birth: December 14, 1950  Transition of Care [facility]) CM/SW Contact:    Shade Flood, LCSW Phone Number: 03/04/2019, 3:23 PM  Clinical Narrative:                  Pt admitted from [facility] where he is a long term care resident. Pt is having CVA workup and will be getting an MRI at [facility] tomorrow. Pt receives outpatient HD at [facility] on TTS schedule.   Will await results of complete workup. If pt needs therapy at [facility], they will need to get auth from pt's [facility].  TOC will follow and assist as needed.  Expected Discharge Plan: Skilled Nursing Facility Barriers to Discharge: Continued Medical Work up   Patient Goals and CMS Choice        Expected Discharge Plan and Services Expected Discharge Plan: [place] In-house Referral: Clinical Social Work     Living arrangements for the past 2 months: [place] Expected Discharge Date: 03/05/19                                    Prior Living Arrangements/Services Living arrangements for the past 2 months: [place] Lives with:: Facility Resident Patient language and need for interpreter reviewed:: Yes Do you feel safe going back to the place where you live?: Yes      Need for Family Participation in Patient Care: No (Comment) Care giver support system in place?: Yes (comment)   Criminal Activity/Legal Involvement Pertinent to Current Situation/Hospitalization: No - Comment as needed  Activities of Daily Living Home Assistive Devices/Equipment: Other (Comment)(from a nursing facility) ADL Screening (condition at time of admission) Patient's cognitive ability adequate to safely complete daily activities?: No Is the patient deaf or have difficulty hearing?: Yes Does the patient have difficulty seeing, even when wearing glasses/contacts?: No Does  the patient have difficulty concentrating, remembering, or making decisions?: Yes Patient able to express need for assistance with ADLs?: Yes Does the patient have difficulty dressing or bathing?: Yes Independently performs ADLs?: No Communication: Independent Dressing (OT): Needs assistance Is this a change from baseline?: Pre-admission baseline Grooming: Needs assistance Is this a change from baseline?: Pre-admission baseline Feeding: Needs assistance Is this a change from baseline?: Pre-admission baseline Bathing: Needs assistance Is this a change from baseline?: Pre-admission baseline Toileting: Needs assistance Is this a change from baseline?: Pre-admission baseline In/Out Bed: Dependent Is this a change from baseline?: Pre-admission baseline Walks in Home: Dependent Is this a change from baseline?: Pre-admission baseline Does the patient have difficulty walking or climbing stairs?: Yes Weakness of Legs: Left Weakness of Arms/Hands: Left  Permission Sought/Granted                  Emotional Assessment Appearance:: Appears stated age Attitude/Demeanor/Rapport: Unable to Assess Affect (typically observed): Unable to Assess Orientation: : Oriented to Self, Oriented to Place Alcohol / Substance Use: Not Applicable Psych Involvement: No (comment)  Admission diagnosis:  Cerebrovascular accident (CVA), unspecified mechanism ([place]) [I63.9] Patient Active Problem List   Diagnosis Date Noted  . NSVT (nonsustained ventricular tachycardia) ([place])   . At moderate risk for fall   . Bell's palsy   . Vascular dementia without behavioral disturbance ([place]) 02/09/2019  . Chronic anticoagulation 12/11/2018  . Hypertension associated with stage  5 chronic kidney disease due to type 2 diabetes mellitus ([place]) 12/04/2018  . S/P AVR (aortic valve replacement) 12/04/2018  . Anemia due to end stage renal disease ([place]) 10/02/2018  . ESRD (end stage renal disease) on dialysis ([place]) 08/06/2018   . PAF (paroxysmal atrial fibrillation) ([place]) 07/31/2018  . Dyslipidemia associated with type 2 diabetes mellitus ([place]) 07/31/2018  . Type 2 diabetes mellitus with hypertension and end stage renal disease on dialysis ([place]) 07/31/2018  . Chronic kidney disease with end stage renal disease on dialysis due to type 2 diabetes mellitus ([place]) 07/31/2018  . Dialysis patient ([place]) 07/31/2018  . BPH with obstruction/lower urinary tract symptoms 07/31/2018  . GERD without esophagitis 07/31/2018  . Chronic constipation 07/31/2018  . 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 Malnutrition of moderate degree 07/20/2018  . Diverticulosis   . Tachycardia   . E coli bacteremia   . Septic shock ([place]) 07/07/2018  . Chronic combined systolic and diastolic heart failure ([place]) 12/09/2016  . COPD (chronic obstructive pulmonary disease) ([place]) 09/12/2016  . Protein-calorie malnutrition, severe 09/06/2016  . HIV disease ([place]) 09/06/2016  . CVA (cerebral vascular accident) ([place]) 06/26/2016  . ICD (implantable cardioverter-defibrillator) in place 01/22/2015  . Anemia in chronic kidney disease 03/26/2014  . Carotid bruit 12/18/2013  . Essential hypertension, benign 10/23/2013  . CAD (coronary artery disease) 10/23/2013   PCP:  [name], MD Pharmacy:  No Pharmacies Listed    Social Determinants of Health (SDOH) Interventions    Readmission Risk Interventions Readmission Risk Prevention Plan 03/04/2019  Transportation Screening Complete  Medication Review ([place]) Complete  PCP or Specialist appointment within 3-5 days of discharge Not Complete  PCP/Specialist Appt Not Complete comments SNF MD to follow  SW Recovery Care/Counseling Consult Complete  Palliative Care Screening Not [place] Complete  Some recent data might be hidden

## 2019-03-04 NOTE — Progress Notes (Signed)
03/04/2019  Pt unable to have MRI at [facility] due to ICD, RN called [facility] and made arrangements for him to have done at [facility] tomorrow at 2pm.  He would transfer by carelink and return after imaging completed.   Murvin Natal MD

## 2019-03-05 ENCOUNTER — Inpatient Hospital Stay (HOSPITAL_COMMUNITY)
Admit: 2019-03-05 | Discharge: 2019-03-05 | Disposition: A | Discharge status: Home / Self Care | Payer: Medicare HMO | Attending: Neurology | Admitting: Neurology

## 2019-03-05 ENCOUNTER — Ambulatory Visit (HOSPITAL_COMMUNITY)
Admit: 2019-03-05 | Discharge: 2019-03-05 | Disposition: A | Discharge status: Home / Self Care | Payer: Medicare HMO | Attending: Family Medicine | Admitting: Family Medicine

## 2019-03-05 ENCOUNTER — Encounter (HOSPITAL_COMMUNITY)
Admission: RE | Admit: 2019-03-05 | Discharge: 2019-03-05 | Disposition: A | Discharge status: Home / Self Care | Payer: Medicare HMO | Source: Skilled Nursing Facility | Attending: Internal Medicine | Admitting: Internal Medicine

## 2019-03-05 DIAGNOSIS — N186 End stage renal disease: Secondary | ICD-10-CM | POA: Insufficient documentation

## 2019-03-05 DIAGNOSIS — I480 Paroxysmal atrial fibrillation: Secondary | ICD-10-CM | POA: Insufficient documentation

## 2019-03-05 DIAGNOSIS — N3289 Other specified disorders of bladder: Secondary | ICD-10-CM

## 2019-03-05 DIAGNOSIS — R338 Other retention of urine: Secondary | ICD-10-CM

## 2019-03-05 DIAGNOSIS — Z7901 Long term (current) use of anticoagulants: Secondary | ICD-10-CM | POA: Insufficient documentation

## 2019-03-05 DIAGNOSIS — I132 Hypertensive heart and chronic kidney disease with heart failure and with stage 5 chronic kidney disease, or end stage renal disease: Secondary | ICD-10-CM | POA: Insufficient documentation

## 2019-03-05 LAB — CBC
HCT: 37.4 % — ABNORMAL LOW (ref 39.0–52.0)
Hemoglobin: 12.6 g/dL — ABNORMAL LOW (ref 13.0–17.0)
MCH: 31.5 pg (ref 26.0–34.0)
MCHC: 33.7 g/dL (ref 30.0–36.0)
MCV: 93.5 fL (ref 80.0–100.0)
Platelets: 81 10*3/uL — ABNORMAL LOW (ref 150–400)
RBC: 4 MIL/uL — ABNORMAL LOW (ref 4.22–5.81)
RDW: 20.9 % — ABNORMAL HIGH (ref 11.5–15.5)
WBC: 2.8 10*3/uL — ABNORMAL LOW (ref 4.0–10.5)
nRBC: 0 % (ref 0.0–0.2)

## 2019-03-05 LAB — GLUCOSE, CAPILLARY
Glucose-Capillary: 106 mg/dL — ABNORMAL HIGH (ref 70–99)
Glucose-Capillary: 106 mg/dL — ABNORMAL HIGH (ref 70–99)
Glucose-Capillary: 162 mg/dL — ABNORMAL HIGH (ref 70–99)

## 2019-03-05 LAB — RENAL FUNCTION PANEL
Albumin: 2.8 g/dL — ABNORMAL LOW (ref 3.5–5.0)
Anion gap: 11 (ref 5–15)
BUN: 37 mg/dL — ABNORMAL HIGH (ref 8–23)
CO2: 26 mmol/L (ref 22–32)
Calcium: 8.8 mg/dL — ABNORMAL LOW (ref 8.9–10.3)
Chloride: 101 mmol/L (ref 98–111)
Creatinine, Ser: 4.74 mg/dL — ABNORMAL HIGH (ref 0.61–1.24)
GFR calc Af Amer: 14 mL/min — ABNORMAL LOW (ref >60)
GFR calc non Af Amer: 12 mL/min — ABNORMAL LOW (ref >60)
Glucose, Bld: 115 mg/dL — ABNORMAL HIGH (ref 70–99)
Phosphorus: 4.1 mg/dL (ref 2.5–4.6)
Potassium: 3.8 mmol/L (ref 3.5–5.1)
Sodium: 138 mmol/L (ref 135–145)

## 2019-03-05 LAB — PROTIME-INR
INR: 2.8 — ABNORMAL HIGH (ref 0.8–1.2)
Prothrombin Time: 28.9 seconds — ABNORMAL HIGH (ref 11.4–15.2)

## 2019-03-05 MED ORDER — LIDOCAINE HCL (PF) 1 % IJ SOLN: 5.0000 mL | INTRAMUSCULAR | Status: DC | PRN

## 2019-03-05 MED ORDER — SODIUM CHLORIDE 0.9 % IV SOLN: 100.0000 mL | INTRAVENOUS | Status: DC | PRN

## 2019-03-05 MED ORDER — PENTAFLUOROPROP-TETRAFLUOROETH EX AERO: 1.0000 "application " | INHALATION_SPRAY | CUTANEOUS | Status: DC | PRN

## 2019-03-05 MED ORDER — DOXERCALCIFEROL 4 MCG/2ML IV SOLN
2.0000 ug | INTRAVENOUS | Status: DC
  Administered 2019-03-05 – 2019-03-07 (×2): 2 ug via INTRAVENOUS
  Filled 2019-03-05 (×2): qty 2

## 2019-03-05 MED ORDER — LIDOCAINE-PRILOCAINE 2.5-2.5 % EX CREA: 1.0000 "application " | TOPICAL_CREAM | CUTANEOUS | Status: DC | PRN

## 2019-03-05 MED ORDER — CHLORHEXIDINE GLUCONATE CLOTH 2 % EX PADS
6.0000 | MEDICATED_PAD | Freq: Every day | CUTANEOUS | Status: DC
  Administered 2019-03-05 – 2019-03-08 (×3): 6 via TOPICAL

## 2019-03-05 NOTE — Progress Notes (Signed)
SLP Cancellation Note  Patient Details Name: Keith Young MRN: [id number] DOB: 29-May-1951   Cancelled treatment:       Reason Eval/Treat Not Completed: Patient at procedure or test/unavailable. Will re-attempt later today as schedule permits. Thank you,  Tristy Udovich H. Roddie Mc, CCC-SLP Speech Language Pathologist    Wende Bushy 03/05/2019, 11:38 AM

## 2019-03-05 NOTE — Progress Notes (Signed)
OT Cancellation Note  Patient Details Name: Keith Young MRN: [id number] DOB: [birth year]   Cancelled Treatment:    Reason Eval/Treat Not Completed: Patient at procedure or test/ unavailable. Pt currently receiving dialysis treatment and is unable to participate in OT evaluation. If patient is discharged prior to receiving acute OT evaluation I recommended that he be evaluated by staff OT at [facility] upon return.    Ailene Ravel, OTR/L,CBIS  [phone]  03/05/2019, 1:26 PM

## 2019-03-05 NOTE — Progress Notes (Signed)
Night shift [facility] MedSurg coverage note.  The patient has not urinated much during the shift. He had a positive bladder scan with more than 500 mL of urine. The staff tried to insert a catheter, but unfortunately were not able to insert a catheter. I spoke to Dr. Jeffie Pollock from urology. The urology service will come to evaluate him and place cath.  Tennis Must, MD

## 2019-03-05 NOTE — Progress Notes (Signed)
12 french I&O catheter attempted x2 nurses, very thick brownish red small amount of urine returned. Dr. Olevia Bowens paged and made aware to call RN regarding urine return. Very foul smell. Possible urology consult needed. Awaiting call back/orders.

## 2019-03-05 NOTE — Progress Notes (Signed)
New order for I&O catheter- 40fench foley tried on patient and met with resistance. AC Abby called for a 12 or 14 french I&O cath.

## 2019-03-05 NOTE — Progress Notes (Signed)
EEG completed, results pending. 

## 2019-03-05 NOTE — Consult Note (Signed)
Reason for Consult: To manage dialysis and dialysis related needs Referring Physician: C Daquawn Seelman is an 68 y.o. male-SNF resident with extensive past medical history to include HIV, COPD, cardiomyopathy with ejection fraction of 20 to 25% with AICD, diabetes mellitus, end-stage renal disease- at [facility] as well as a history of stroke.  He was brought to the emergency department on Saturday when he was noted to become unresponsive during his hemodialysis treatment.  Work-up is ongoing, felt to possibly have had another stroke.  He is not able to provide too much history.  We are asked to provide dialysis services.  He is alert eating breakfast on his own this morning, seems to be an improvement from what I saw in the chart   Dialyzes at [facility] -TTS-4 hours EDW 66.5. HD Bath 2K/2.5 calcium, Dialyzer unknown, Heparin unknown. Access left AV graft-15-gauge, 400/600.  Hectorol 2 mics with treatment  Past Medical History:  Diagnosis Date  . AICD (automatic cardioverter/defibrillator) present   . Alcoholism in remission ([place])   . Anemia   . Aortic insufficiency   . Arthritis   . At moderate risk for fall   . Bell's palsy   . Chronic systolic heart failure (HCC)    a. EF previously 15-20% with cath in 2014 showing no significant CAD b. EF 20-25% by echo in 06/2018  . CKD (chronic kidney disease), stage IV ([place])   . COPD (chronic obstructive pulmonary disease) ([place])   . Essential hypertension, benign   . HIV disease ([place]) 09/06/2016  . Hyperlipidemia   . NICM (nonischemic cardiomyopathy) ([place])   . Noncompliance with medication regimen   . NSVT (nonsustained ventricular tachycardia) ([place])   . Numbness of right jaw    Had a stoke in 01/2013. Numbness is occasional, especially when trying to chew.  . OSA (obstructive sleep apnea)   . Productive cough 08/2013   With brown sputum   . S/P aortic valve replacement with bioprosthetic valve   . Stroke ([place]) 01/2013   weakness of right side from CVA  . Type 2 diabetes mellitus ([place])   . Type 2 diabetes mellitus with hypertension and end stage renal disease on dialysis ([place]) 07/31/2018  . Uses hearing aid 2014   recently received new hearing aids    Past Surgical History:  Procedure Laterality Date  . AV FISTULA PLACEMENT Left 07/20/2018   Procedure: INSERTION ARTERIOVENOUS GORTEX GRAFT  LEFT ARM;  Surgeon: Rosetta Posner, MD;  Location: [place];  Service: Vascular;  Laterality: Left;  . BIOPSY N/A 04/10/2014   Procedure: GASTRIC BIOPSY;  Surgeon: Daneil Dolin, MD;  Location: [facility];  Service: Endoscopy;  Laterality: N/A;  . BIOPSY  10/12/2015   Procedure: BIOPSY;  Surgeon: Daneil Dolin, MD;  [facility];  Service: Endoscopy;;  stomach bx's  . CARDIAC DEFIBRILLATOR PLACEMENT  11/12/12   [employer] Inogen MINI ICD implanted in [place] at [place] AVR per Dr Nelly Laurence note  . COLONOSCOPY WITH PROPOFOL N/A 04/10/2014   RMR: Colonic Diverticulosis  . ESOPHAGOGASTRODUODENOSCOPY (EGD) WITH PROPOFOL N/A 04/10/2014   RMR: Mild erosive reflux esophagitis. Multiple antral polyps likely hyperplastic status post removal by hot snare cautery technique. Diffusely abnormal stomach status post gastric biopsy I suspect some of patients anemaia may be due to intermittent oozing from the stomach. It would be difficult and a risky proposition to attempt complete removal of all of his  gastric polyps.  . ESOPHAGOGASTRODUODENOSCOPY (EGD) WITH PROPOFOL N/A 10/12/2015   Procedure: ESOPHAGOGASTRODUODENOSCOPY (EGD) WITH PROPOFOL;  Surgeon: Daneil Dolin, MD;  [facility];  Service: Endoscopy;  Laterality: N/A;  [id number] - moved to 9:15  . INSERTION OF DIALYSIS CATHETER Right 07/20/2018   Procedure: EXCHANGE OF DIALYSIS CATHETER RIGHT INTERNAL JUGULAR;  Surgeon: Rosetta Posner, MD;  Location: [place];  Service: Vascular;  Laterality: Right;  . IR FLUORO GUIDE CV  LINE RIGHT  07/09/2018  . IR US GUIDE VASC ACCESS RIGHT  07/09/2018  . POLYPECTOMY N/A 04/10/2014   Procedure: GASTRIC POLYPECTOMY;  Surgeon: Daneil Dolin, MD;  Location: [facility];  Service: Endoscopy;  Laterality: N/A;  . RIGHT HEART CATHETERIZATION N/A 02/06/2014   Procedure: RIGHT HEART CATH;  Surgeon: Larey Dresser, MD;  Location: [facility] CATH LAB;  Service: Cardiovascular;  Laterality: N/A;    Family History  Problem Relation Age of Onset  . Kidney disease Mother   . Kidney disease Sister   . Colon cancer Neg Hx     Social History:  reports that he quit smoking about 25 years ago. His smoking use included cigarettes. He has never used smokeless tobacco. He reports previous alcohol use. He reports that he does not use drugs.  Allergies: No Known Allergies  Medications: I have reviewed the patient's current medications.   Results for orders placed or performed during the hospital encounter of 03/02/19 (from the past 48 hour(s))  Glucose, capillary     Status: Abnormal   Collection Time: 03/03/19  8:59 AM  Result Value Ref Range   Glucose-Capillary 65 (L) 70 - 99 mg/dL   Comment 1 Notify RN    Comment 2 Document in Chart   Glucose, capillary     Status: Abnormal   Collection Time: 03/03/19 11:01 AM  Result Value Ref Range   Glucose-Capillary 65 (L) 70 - 99 mg/dL   Comment 1 Notify RN    Comment 2 Document in Chart   Glucose, capillary     Status: Abnormal   Collection Time: 03/03/19 11:45 AM  Result Value Ref Range   Glucose-Capillary 114 (H) 70 - 99 mg/dL   Comment 1 Notify RN   Glucose, capillary     Status: None   Collection Time: 03/03/19  5:38 PM  Result Value Ref Range   Glucose-Capillary 88 70 - 99 mg/dL  Glucose, capillary     Status: None   Collection Time: 03/04/19 12:01 AM  Result Value Ref Range   Glucose-Capillary 84 70 - 99 mg/dL  CBC     Status: Abnormal   Collection Time: 03/04/19  5:11 AM  Result Value Ref Range   WBC 2.7 (L) 4.0 - 10.5 K/uL   RBC 4.02  (L) 4.22 - 5.81 MIL/uL   Hemoglobin 12.7 (L) 13.0 - 17.0 g/dL   HCT 38.2 (L) 39.0 - 52.0 %   MCV 95.0 80.0 - 100.0 fL   MCH 31.6 26.0 - 34.0 pg   MCHC 33.2 30.0 - 36.0 g/dL   RDW 21.5 (H) 11.5 - 15.5 %   Platelets 79 (L) 150 - 400 K/uL    Comment: SPECIMEN CHECKED FOR CLOTS Immature Platelet Fraction may be clinically indicated, consider ordering this additional test [id number] CONSISTENT WITH PREVIOUS RESULT    nRBC 0.0 0.0 - 0.2 %    Comment: Performed at [facility], [address], [place], [place] [address]  Basic metabolic panel     Status: Abnormal  Collection Time: 03/04/19  5:11 AM  Result Value Ref Range   Sodium 138 135 - 145 mmol/L   Potassium 4.0 3.5 - 5.1 mmol/L   Chloride 100 98 - 111 mmol/L   CO2 24 22 - 32 mmol/L   Glucose, Bld 80 70 - 99 mg/dL   BUN 30 (H) 8 - 23 mg/dL   Creatinine, Ser 3.96 (H) 0.61 - 1.24 mg/dL   Calcium 8.8 (L) 8.9 - 10.3 mg/dL   GFR calc non Af Amer 15 (L) >60 mL/min   GFR calc Af Amer 17 (L) >60 mL/min   Anion gap 14 5 - 15    Comment: Performed at [facility], [address], [place], [place] [address]  Magnesium     Status: None   Collection Time: 03/04/19  5:11 AM  Result Value Ref Range   Magnesium 2.1 1.7 - 2.4 mg/dL    Comment: Performed at [facility], [address] [place], [place] [address]  Phosphorus     Status: None   Collection Time: 03/04/19  5:11 AM  Result Value Ref Range   Phosphorus 3.9 2.5 - 4.6 mg/dL    Comment: Performed at [facility], [address], [place], [place] [address]  Glucose, capillary     Status: None   Collection Time: 03/04/19  6:06 AM  Result Value Ref Range   Glucose-Capillary 79 70 - 99 mg/dL  Protime-INR     Status: Abnormal   Collection Time: 03/04/19  7:32 AM  Result Value Ref Range   Prothrombin Time 26.3 (H) 11.4 - 15.2 seconds   INR 2.5 (H) 0.8 - 1.2    Comment: (NOTE) INR goal varies based on device and disease states. Performed at [facility], [address], [place],  [place] [address]   Glucose, capillary     Status: None   Collection Time: 03/04/19 11:42 AM  Result Value Ref Range   Glucose-Capillary 84 70 - 99 mg/dL   Comment 1 Notify RN   Glucose, capillary     Status: None   Collection Time: 03/04/19  5:02 PM  Result Value Ref Range   Glucose-Capillary 92 70 - 99 mg/dL   Comment 1 Notify RN    Comment 2 Document in Chart   Glucose, capillary     Status: Abnormal   Collection Time: 03/04/19  9:44 PM  Result Value Ref Range   Glucose-Capillary 183 (H) 70 - 99 mg/dL   Comment 1 Notify RN    Comment 2 Document in Chart   Protime-INR     Status: Abnormal   Collection Time: 03/05/19  5:06 AM  Result Value Ref Range   Prothrombin Time 28.9 (H) 11.4 - 15.2 seconds   INR 2.8 (H) 0.8 - 1.2    Comment: (NOTE) INR goal varies based on device and disease states. Performed at [facility], [address] [place],  [address]   Renal function panel     Status: Abnormal   Collection Time: 03/05/19  5:06 AM  Result Value Ref Range   Sodium 138 135 - 145 mmol/L   Potassium 3.8 3.5 - 5.1 mmol/L   Chloride 101 98 - 111 mmol/L   CO2 26 22 - 32 mmol/L   Glucose, Bld 115 (H) 70 - 99 mg/dL   BUN 37 (H) 8 - 23 mg/dL   Creatinine, Ser 4.74 (H) 0.61 - 1.24 mg/dL   Calcium 8.8 (L) 8.9 - 10.3 mg/dL   Phosphorus 4.1 2.5 - 4.6  mg/dL   Albumin 2.8 (L) 3.5 - 5.0 g/dL   GFR calc non Af Amer 12 (L) >60 mL/min   GFR calc Af Amer 14 (L) >60 mL/min   Anion gap 11 5 - 15    Comment: Performed at [facility], [address], [place], [place] [address]  CBC     Status: Abnormal   Collection Time: 03/05/19  5:06 AM  Result Value Ref Range   WBC 2.8 (L) 4.0 - 10.5 K/uL   RBC 4.00 (L) 4.22 - 5.81 MIL/uL   Hemoglobin 12.6 (L) 13.0 - 17.0 g/dL   HCT 37.4 (L) 39.0 - 52.0 %   MCV 93.5 80.0 - 100.0 fL   MCH 31.5 26.0 - 34.0 pg   MCHC 33.7 30.0 - 36.0 g/dL   RDW 20.9 (H) 11.5 - 15.5 %   Platelets 81 (L) 150 - 400 K/uL    Comment: SPECIMEN CHECKED FOR CLOTS Immature  Platelet Fraction may be clinically indicated, consider ordering this additional test [id number] CONSISTENT WITH PREVIOUS RESULT    nRBC 0.0 0.0 - 0.2 %    Comment: Performed at [facility], [address] [place], [place] [address]  Glucose, capillary     Status: Abnormal   Collection Time: 03/05/19  7:35 AM  Result Value Ref Range   Glucose-Capillary 106 (H) 70 - 99 mg/dL    No results found.  ROS: Negative, patient not the best historian Blood pressure 114/85, pulse 88, temperature (!) 97.1 F (36.2 C), temperature source Oral, resp. rate 20, height 5\' 3"  (1.6 m), weight 68 kg, SpO2 98 %. General appearance: alert, no distress and Eating breakfast, no complaints, some coughing Resp: diminished breath sounds bibasilar Cardio: irregularly irregular rhythm GI: soft, non-tender; bowel sounds normal; no masses,  no organomegaly Extremities: edema Edema mostly on the left side, associated with previous stroke Left forearm AV graft which is patent.  Also left upper extremity edema likely associated with previous stroke  Assessment/Plan: 68 year old black male, skilled nursing facility patient with multiple medical issues including ESRD.  He presents with a probable new stroke 1 possible new CVA-Per neurology and primary team-anticoagulation,  Statin, rehab 2 ESRD: Normally TTS schedule via AV graft.  We will plan for routine dialysis today without heparin 3 Hypertension: Chronic low blood pressure on midodrine as outpatient-also low-dose Toprol for cardiomyopathy 4. Anemia of ESRD: Does not appear to be too much of an issue at present, hemoglobin greater than 12 5. Metabolic Bone Disease: Continue home Hectorol dosing, no binder noted on med list-phosphorus 4.1 6.  HIV-continue HAART therapy  Louis Meckel 03/05/2019, 8:28 AM

## 2019-03-05 NOTE — Progress Notes (Signed)
ANTICOAGULATION CONSULT NOTE - Initial Consult  Pharmacy Consult for warfarin Indication: atrial fibrillation/AVR  No Known Allergies  Patient Measurements: Height: 5\' 3"  (160 cm) Weight: 149 lb 14.4 oz (68 kg) IBW/kg (Calculated) : 56.9   Vital Signs: Temp: 98 F (36.7 C) (10/06 0900) Temp Source: Oral (10/06 0900) BP: 98/74 (10/06 1145) Pulse Rate: 82 (10/06 1145)  Labs: Recent Labs    03/02/19 1534 03/03/19 0600 03/04/19 0511 03/04/19 0732 03/05/19 0506  HGB 12.4*  --  12.7*  --  12.6*  HCT 36.3*  --  38.2*  --  37.4*  PLT 79*  --  79*  --  81*  APTT 42*  --   --   --   --   LABPROT 22.2* 23.2*  --  26.3* 28.9*  INR 2.0* 2.1*  --  2.5* 2.8*  CREATININE 2.53* 3.13* 3.96*  --  4.74*    Estimated Creatinine Clearance: 12.2 mL/min (A) (by C-G formula based on SCr of 4.74 mg/dL (H)).    Assessment: Pharmacy consulted to dose warfarin for patient with atrial fibrillation and bioprosthetic AVR.  Patient's warfarin is currently being held due to likely CVA where MRI is scheduled 10/6. Home dose listed as 3.5 mg daily  INR : 2.8  Despite holding warraing  Goal of Therapy:  INR 2-3 (2.5-3.0 per note on 9/1) Monitor platelets by anticoagulation protocol: Yes   Plan:  Hold warfarin due to scheduled MRI 10/6 at [facility]. Monitor labs and s/s of bleeding  Despina Pole 03/05/2019,11:58 AM

## 2019-03-05 NOTE — Consult Note (Signed)
Urology Consult  Consulting MD: Irwin Brakeman, MD  CC: Urinary retention  HPI: This is a 68year old male with long, complex medical history admitted for medical issues.  The patient is on chronic hemodialysis.  The patient is not fully alert for history giving.  Urologic consultation was requested for urinary residual volume of 500 mL.  The patient is not complaining of urinary discomfort.  He does not know if he still makes urine or not.  That he knows of he has no prior urologic history.  Nurses tried placing a 12 French catheter and were unsuccessful.  PMH: Past Medical History:  Diagnosis Date  . AICD (automatic cardioverter/defibrillator) present   . Alcoholism in remission ([place])   . Anemia   . Aortic insufficiency   . Arthritis   . At moderate risk for fall   . Bell's palsy   . Chronic systolic heart failure (HCC)    a. EF previously 15-20% with cath in 2014 showing no significant CAD b. EF 20-25% by echo in 06/2018  . CKD (chronic kidney disease), stage IV ([place])   . COPD (chronic obstructive pulmonary disease) ([place])   . Essential hypertension, benign   . HIV disease ([place]) 09/06/2016  . Hyperlipidemia   . NICM (nonischemic cardiomyopathy) ([place])   . Noncompliance with medication regimen   . NSVT (nonsustained ventricular tachycardia) ([place])   . Numbness of right jaw    Had a stoke in 01/2013. Numbness is occasional, especially when trying to chew.  . OSA (obstructive sleep apnea)   . Productive cough 08/2013   With brown sputum   . S/P aortic valve replacement with bioprosthetic valve   . Stroke ([place]) 01/2013   weakness of right side from CVA  . Type 2 diabetes mellitus ([place])   . Type 2 diabetes mellitus with hypertension and end stage renal disease on dialysis ([place]) 07/31/2018  . Uses hearing aid 2014   recently received new hearing aids    PSH: Past Surgical History:  Procedure Laterality Date  . AV FISTULA PLACEMENT Left 07/20/2018   Procedure: INSERTION  ARTERIOVENOUS GORTEX GRAFT  LEFT ARM;  Surgeon: Rosetta Posner, MD;  Location: [place];  Service: Vascular;  Laterality: Left;  . BIOPSY N/A 04/10/2014   Procedure: GASTRIC BIOPSY;  Surgeon: Daneil Dolin, MD;  Location: [facility];  Service: Endoscopy;  Laterality: N/A;  . BIOPSY  10/12/2015   Procedure: BIOPSY;  Surgeon: Daneil Dolin, MD;  [facility];  Service: Endoscopy;;  stomach bx's  . CARDIAC DEFIBRILLATOR PLACEMENT  11/12/12   [employer] Inogen MINI ICD implanted in [place] at [place] AV[place] per Dr Nelly Laurence note  . COLONOSCOPY WITH PROPOFOL N/A 04/10/2014   RMR: Colonic Diverticulosis  . ESOPHAGOGASTRODUODENOSCOPY (EGD) WITH PROPOFOL N/A 04/10/2014   RMR: Mild erosive reflux esophagitis. Multiple antral polyps likely hyperplastic status post removal by hot snare cautery technique. Diffusely abnormal stomach status post gastric biopsy I suspect some of patients anemaia may be due to intermittent oozing from the stomach. It would be difficult and a risky proposition to attempt complete removal of all of his gastric polyps.  . ESOPHAGOGASTRODUODENOSCOPY (EGD) WITH PROPOFOL N/A 10/12/2015   Procedure: ESOPHAGOGASTRODUODENOSCOPY (EGD) WITH PROPOFOL;  Surgeon: Daneil Dolin, MD;  [facility];  Service: Endoscopy;  Laterality: N/A;  9977 - moved to 9:15  . INSERTION OF DIALYSIS CATHETER Right 07/20/2018   Procedure: EXCHANGE OF DIALYSIS CATHETER  RIGHT INTERNAL JUGULAR;  Surgeon: Rosetta Posner, MD;  Location: [facility] OR;  Service: Vascular;  Laterality: Right;  . IR FLUORO GUIDE CV LINE RIGHT  07/09/2018  . IR US GUIDE VASC ACCESS RIGHT  07/09/2018  . POLYPECTOMY N/A 04/10/2014   Procedure: GASTRIC POLYPECTOMY;  Surgeon: Daneil Dolin, MD;  Location: [facility];  Service: Endoscopy;  Laterality: N/A;  . RIGHT HEART CATHETERIZATION N/A 02/06/2014   Procedure: RIGHT HEART CATH;  Surgeon: Larey Dresser, MD;  Location: [facility] CATH LAB;   Service: Cardiovascular;  Laterality: N/A;    Allergies: No Known Allergies  Medications: Medications Prior to Admission  Medication Sig Dispense Refill Last Dose  . atorvastatin (LIPITOR) 80 MG tablet Take 80 mg by mouth daily.    03/01/2019 at Unknown time  . bictegravir-emtricitabine-tenofovir AF (BIKTARVY) 50-200-25 MG TABS tablet Take 1 tablet by mouth daily. 30 tablet 11 03/01/2019 at Unknown time  . calcium carbonate (TUMS EX) 750 MG chewable tablet Chew 1 tablet by mouth 2 (two) times daily.   03/01/2019 at Unknown time  . Cholecalciferol (VITAMIN D3) 2000 UNITS TABS Take 2,000 mg by mouth daily.   03/01/2019 at Unknown time  . docusate sodium (COLACE) 100 MG capsule Take 100 mg by mouth daily.   03/01/2019 at Unknown time  . Fluticasone-Salmeterol (ADVAIR) 100-50 MCG/DOSE AEPB Inhale 1 puff into the lungs daily.   03/01/2019 at Unknown time  . metoprolol succinate (TOPROL-XL) 25 MG 24 hr tablet Take 1 tablet (25 mg total) by mouth daily.   03/01/2019 at 1800  . midodrine (PROAMATINE) 10 MG tablet Take 10 mg by mouth. Send medication with resident to be given at center. Once a day on Tues.,Thurs., Sat.   03/02/2019 at 0830  . multivitamin (RENA-VIT) TABS tablet Take 1 tablet by mouth at bedtime.  0 03/01/2019 at Unknown time  . omeprazole (PRILOSEC) 20 MG capsule Take 20 mg by mouth at bedtime.   03/01/2019 at Unknown time  . senna-docusate (SENEXON-S) 8.6-50 MG tablet Take 1 tablet by mouth daily.   03/01/2019 at Unknown time  . tamsulosin (FLOMAX) 0.4 MG CAPS capsule Take 0.4 mg by mouth daily.   03/01/2019 at Unknown time  . warfarin (COUMADIN) 1 MG tablet Take 1 mg by mouth daily. Give along with 2.5 mg tab for total of 3.5 daily   03/01/2019 at 1800  . warfarin (COUMADIN) 2.5 MG tablet Take 2.5 mg by mouth daily. Give along with 1 mg tab for total of 3.5 mg daily   03/01/2019 at 1800  . acetaminophen (TYLENOL) 325 MG tablet Take 2 tablets (650 mg total) by mouth every 4 (four) hours as needed  for mild pain (or temp > 37.5 C (99.5 F)).     Marland Kitchen albuterol (PROVENTIL) (2.5 MG/3ML) 0.083% nebulizer solution Take 3 mLs (2.5 mg total) by nebulization every 4 (four) hours as needed for wheezing or shortness of breath. 75 mL 12   . coal tar (NEUTROGENA T-GEL) 0.5 % shampoo Apply to scalp on shower days (Twice a week) for dandruff and dry scalp Wed., and Sat.     . NON FORMULARY Diet Type:  Dysphagia 2 with thin liquids (NO Straws)     . Nutritional Supplements (FEEDING SUPPLEMENT, NEPRO CARB STEADY,) LIQD Take by mouth daily.     Marland Kitchen UNABLE TO FIND FLUID RESTRICTION 1200 CC/ 24hrs. Breakfast (12oz) 360 ml lunch (8oz) 240 ml Dinner (12oz) 360 ml Every Shift Day, Evening, Night     . ZINC  OXIDE EX Apply Zinc oxide to bilateral buttocks area for irritation. Every Shift Day, Day, Day        Social History: Social History   Socioeconomic History  . Marital status: Single    Spouse name: Not on file  . Number of children: Not on file  . Years of education: Not on file  . Highest education level: Not on file  Occupational History  . Occupation: disabled  Social Needs  . Financial resource strain: Not hard at all  . Food insecurity    Worry: Never true    Inability: Never true  . Transportation needs    Medical: No    Non-medical: No  Tobacco Use  . Smoking status: Former Smoker    Types: Cigarettes    Quit date: 11/05/1993    Years since quitting: 25.3  . Smokeless tobacco: Never Used  Substance and Sexual Activity  . Alcohol use: Not Currently    Comment: former heavy ETOH, none recently  . Drug use: No    Comment: prior history of cocaine use, smoking   . Sexual activity: Yes    Birth control/protection: None  Lifestyle  . Physical activity    Days per week: 0 days    Minutes per session: 0 min  . Stress: Not at all  Relationships  . Social Herbalist on phone: Never    Gets together: Once a week    Attends religious service: Never    Active member of club  or organization: No    Attends meetings of clubs or organizations: Never    Relationship status: Never married  . Intimate partner violence    Fear of current or ex partner: No    Emotionally abused: No    Physically abused: No    Forced sexual activity: No  Other Topics Concern  . Not on file  Social History Narrative    Has h/o polysubstance abuse.  Baseline    2019- resident of [facility]. Unable to ambulate unassisted.             Family History: Family History  Problem Relation Age of Onset  . Kidney disease Mother   . Kidney disease Sister   . Colon cancer Neg Hx     Review of Systems: Level V caveat  Physical Exam: @VITALS2 @ General: No acute distress.  Awake.  Not well oriented Head:  Normocephalic.  Atraumatic. ENT:  EOMI.  Mucous membranes moist Neck:  Supple.  No lymphadenopathy. Pulmonary: Equal effort bilaterally.   Abdomen: Soft, obese, nontender to palpation. Skin:  Normal turgor.  No visible rash. Extremity: No gross deformity of upper extremities.  No gross deformity of lower extremities. Neurologic: Alert. Appropriate mood.  Penis:  Uncircumcised.  No lesions. Urethra: Orthotopic meatus. Scrotum: No lesions.  No ecchymosis.  No erythema. Testicles: Descended bilaterally.  No masses bilaterally. Epididymis: Palpable bilaterally.  Non Tender to palpation.  Studies:  Recent Labs    03/04/19 0511 03/05/19 0506  HGB 12.7* 12.6*  WBC 2.7* 2.8*  PLT 79* 81*    Recent Labs    03/04/19 0511 03/05/19 0506  NA 138 138  K 4.0 3.8  CL 100 101  CO2 24 26  BUN 30* 37*  CREATININE 3.96* 4.74*  CALCIUM 8.8* 8.8*  GFRNONAA 15* 12*  GFRAA 17* 14*     Recent Labs    03/04/19 0732 03/05/19 0506  INR 2.5* 2.8*     Invalid input(s): ABG  After sterile prep and drape, attempted insertion of an 45 French coud tip catheter was unsuccessful.  Due to time constraints, I was unable to perform flexible cystoscopy at the time of catheter  attempt.  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  Residual urine volume was 500 mL, but this may be longstanding.  I think it is worth trying later on to scope a catheter in.  I will notify my partner, Dr. Alyson Ingles for possible attempt tomorrow.    Pager:[phone]

## 2019-03-05 NOTE — Progress Notes (Signed)
PROGRESS NOTE    Keith Young  OMV:[id number]  DOB: [birth year]  DOA: 03/02/2019 PCP: [name], MD  Brief Admission Hx: 68 year old male sent to ED after he was found unresponsive after hemodialysis at the [facility].  He was seen by the tele-neurologist who was concerned for stroke.  He was not a candidate for TPA given that he was therapeutic on warfarin.  MRI brain was recommended.  MDM/Assessment & Plan:   1. Suspected acute CVA -patient has a pronounced facial droop and has failed a swallow screen however he has a history of Bells' Palsy.  Rule out for acute cerebrovascular accident.  Unfortunately we do not have MRI available at this hospital due to his ICD.  We have called [facility] and they plan to do his MRI today at 2pm.  Continue stroke work-up as ordered.  Continue aspirin.  SLP evaluation PT OT evaluation and echocardiogram ordered.  Holding warfarin until after MRI per tele-neurologist recommendations.  Check EEG.  Inpatient neurology consult appreciated.   Continue neurochecks.  Continue supportive therapy. 2. Hypoglycemia-likely from being n.p.o.  I placed him on a dextrose infusion but will back off now that BS have improved. 3. End-stage renal disease on hemodialysis TTS-Consulted nephrology as he will require dialysis TTS. 4. Ischemic cardiomyopathy-patient has an ICD in place in 2014. Also may need to have device interrogated to see if that may be the reason he was found unresponsive after his outpatient HD treatment. 5. PAF-he has been maintained on warfarin for his bioprosthetic AVR and for stroke prevention.  His INR today is 2.5.  Tele-neurology asked to hold warfarin until after the MRI is been completed.  Likely can resume warfarin if no major bleeding. 6. Essential hypertension-stable.  He does take Midrin on dialysis days due to soft BPs. 7. HIV- he has been maintained on HAART.  8. COPD - stable, resumed home bronchodilators.  9. Type 2 DM  -blood sugars better today now that he is eating.     DVT prophylaxis: warfarin Code Status: DNR  Family Communication: patient updated bedside Disposition Plan: inpatient  Consultants:  Neurology   Procedures:  n/a  Antimicrobials:  n/a   Subjective: Pt has no complaints today.  He is confused.   Objective: Vitals:   03/05/19 1145 03/05/19 1200 03/05/19 1215 03/05/19 1230  BP: 98/74 94/66 (!) 83/57 100/65  Pulse: 82 86 85 84  Resp:      Temp:      TempSrc:      SpO2:      Weight:      Height:        Intake/Output Summary (Last 24 hours) at 03/05/2019 1320 Last data filed at 03/05/2019 0842 Gross per 24 hour  Intake 385 ml  Output -  Net 385 ml   Filed Weights   03/02/19 1538  Weight: 68 kg   REVIEW OF SYSTEMS  As per history otherwise all reviewed and reported negative  Exam:  General exam: awake, alert, oriented to person, pronounced facial droop, slightly slurred speech. Respiratory system:  No increased work of breathing. Cardiovascular system: irregularly irregular S1 & S2 heard. No JVD, murmurs, gallops, clicks or pedal edema. Gastrointestinal system: Abdomen is nondistended, soft and nontender. Normal bowel sounds heard. Central nervous system: Alert, with pronounced facial droop Bells Palsy. Slightly diminished LUE strength otherwise no deficits. Extremities: no CCE.  Data Reviewed: Basic Metabolic Panel: Recent Labs  Lab 03/02/19 1534 03/03/19 0600 03/04/19 0511 03/05/19 6283  NA 136 138 138 138  K 3.1* 3.4* 4.0 3.8  CL 98 99 100 101  CO2 26 26 24 26   GLUCOSE 106* 65* 80 115*  BUN 19 24* 30* 37*  CREATININE 2.53* 3.13* 3.96* 4.74*  CALCIUM 8.3* 8.6* 8.8* 8.8*  MG  --   --  2.1  --   PHOS  --   --  3.9 4.1   Liver Function Tests: Recent Labs  Lab 03/02/19 1534 03/03/19 0600 03/05/19 0506  AST 47* 42*  --   ALT 45* 40  --   ALKPHOS 161* 150*  --   BILITOT 1.8* 2.1*  --   PROT 7.0 6.6  --   ALBUMIN 2.9* 2.9* 2.8*   No  results for input(s): LIPASE, AMYLASE in the last 168 hours. No results for input(s): AMMONIA in the last 168 hours. CBC: Recent Labs  Lab 03/02/19 1534 03/04/19 0511 03/05/19 0506  WBC 2.4* 2.7* 2.8*  NEUTROABS 1.6*  --   --   HGB 12.4* 12.7* 12.6*  HCT 36.3* 38.2* 37.4*  MCV 92.6 95.0 93.5  PLT 79* 79* 81*   Cardiac Enzymes: No results for input(s): CKTOTAL, CKMB, CKMBINDEX, TROPONINI in the last 168 hours. CBG (last 3)  Recent Labs    03/04/19 2144 03/05/19 0735 03/05/19 1117  GLUCAP 183* 106* 106*   Recent Results (from the past 240 hour(s))  SARS CORONAVIRUS 2 (TAT 6-24 HRS) Nasopharyngeal Nasopharyngeal Swab     Status: None   Collection Time: 03/02/19  7:35 PM   Specimen: Nasopharyngeal Swab  Result Value Ref Range Status   SARS Coronavirus 2 NEGATIVE NEGATIVE Final    Comment: (NOTE) SARS-CoV-2 target nucleic acids are NOT DETECTED. The SARS-CoV-2 RNA is generally detectable in upper and lower respiratory specimens during the acute phase of infection. Negative results do not preclude SARS-CoV-2 infection, do not rule out co-infections with other pathogens, and should not be used as the sole basis for treatment or other patient management decisions. Negative results must be combined with clinical observations, patient history, and epidemiological information. The expected result is Negative. Fact Sheet for Patients: SugarRoll.be Fact Sheet for Healthcare Providers: https://www.woods-mathews.com/ This test is not yet approved or cleared by the [place] FDA and  has been authorized for detection and/or diagnosis of SARS-CoV-2 by FDA under an Emergency Use Authorization (EUA). This EUA will remain  in effect (meaning this test can be used) for the duration of the COVID-19 declaration under Section 56 4(b)(1) of the Act, 21 U.S.C. section 360bbb-3(b)(1), unless the authorization is terminated or revoked sooner.  Performed at [facility], [place] [address], [place], [place] [address]   MRSA PCR Screening     Status: None   Collection Time: 03/02/19 11:39 PM   Specimen: Nasopharyngeal  Result Value Ref Range Status   MRSA by PCR NEGATIVE NEGATIVE Final    Comment:        The GeneXpert MRSA Assay (FDA approved for NASAL specimens only), is one component of a comprehensive MRSA colonization surveillance program. It is not intended to diagnose MRSA infection nor to guide or monitor treatment for MRSA infections. Performed at [facility], [address], [place], [place] [address]      Studies: No results found. Scheduled Meds: . atorvastatin  80 mg Oral Daily  . bictegravir-emtricitabine-tenofovir AF  1 tablet Oral Daily  . chlorhexidine  15 mL Mouth Rinse BID  . Chlorhexidine Gluconate Cloth  6 each Topical Q0600  .  cholecalciferol  2,000 Units Oral Daily  . docusate sodium  100 mg Oral Daily  . doxercalciferol  2 mcg Intravenous Q T,Th,Sa-HD  . fluticasone furoate-vilanterol  1 puff Inhalation Daily  . mouth rinse  15 mL Mouth Rinse q12n4p  . metoprolol succinate  25 mg Oral Daily  . midodrine  10 mg Oral Q T,Th,Sa-HD  . multivitamin with minerals  1 tablet Oral Daily  . pantoprazole  40 mg Oral Daily  . tamsulosin  0.4 mg Oral Daily   Continuous Infusions: . sodium chloride    . sodium chloride      Principal Problem:   CVA (cerebral vascular accident) ([place]) Active Problems:   Essential hypertension, benign   CAD (coronary artery disease)   ICD (implantable cardioverter-defibrillator) in place   HIV disease ([place])   COPD (chronic obstructive pulmonary disease) (HCC)   PAF (paroxysmal atrial fibrillation) (HCC)   Type 2 diabetes mellitus with hypertension and end stage renal disease on dialysis ([place])   ESRD (end stage renal disease) on dialysis (HCC)   Chronic anticoagulation   NSVT (nonsustained ventricular tachycardia) (HCC)   At moderate risk for fall   Bell's  palsy  Time spent:   Irwin Brakeman, MD [facility] 03/05/2019, 1:20 PM    LOS: 3 days  How to contact the [facility] Attending or Consulting provider [place] or covering provider during after hours [place], for this patient?  1. Check the care team in [facility] and look for a) attending/consulting TRH provider listed and b) the [facility] team listed 2. Log into [email or web address] and use [place]'s universal password to access. If you do not have the password, please contact the hospital operator. 3. Locate the [facility] provider you are looking for under [facility] and page to a number that you can be directly reached. 4. If you still have difficulty reaching the provider, please page the [facility] (Director on Call) for the Hospitalists listed on amion for assistance.

## 2019-03-05 NOTE — Progress Notes (Signed)
Pt urine has highly odorous smell, <1mL put out all night through condom catheter. Bladder scanned with > 536mL in bladder. Dr. Olevia Bowens paged to make aware. Waiting for orders/ call back.

## 2019-03-05 NOTE — Procedures (Signed)
    HEMODIALYSIS TREATMENT NOTE:  3.5 hour heparin-free treatment completed via left forearm loop AVG (15g/antegrade flow). Goal met: 1 liter removed with administration of MIdodrine and Albumin.  All blood was returned.  Prolonged bleeding from arterial needle site.  After d/w Dr. Moshe Cipro, pt's 4-hour session was terminated 30 min early so patient could transfer to [place] for MRI (has AICD).  Rockwell Alexandria, RN

## 2019-03-05 NOTE — Procedures (Signed)
  [place] A. Merlene Laughter, MD     [email or web address]           HISTORY: This is a 68 year old man who presents with episode of unresponsiveness and confusion that is concerning for seizures.  MEDICATIONS:  Current Facility-Administered Medications:  .  0.9 %  sodium chloride infusion, 100 mL, Intravenous, PRN, Corliss Parish, MD .  0.9 %  sodium chloride infusion, 100 mL, Intravenous, PRN, Corliss Parish, MD .  acetaminophen (TYLENOL) suppository 650 mg, 650 mg, Rectal, Q6H PRN, Johnson, Clanford L, MD .  albuterol (PROVENTIL) (2.5 MG/3ML) 0.083% nebulizer solution 2.5 mg, 2.5 mg, Nebulization, Q4H PRN, Johnson, Clanford L, MD .  atorvastatin (LIPITOR) tablet 80 mg, 80 mg, Oral, Daily, Johnson, Clanford L, MD, 80 mg at 03/05/19 0900 .  bictegravir-emtricitabine-tenofovir AF (BIKTARVY) 50-200-25 MG per tablet 1 tablet, 1 tablet, Oral, Daily, Johnson, Clanford L, MD, 1 tablet at 03/05/19 0900 .  chlorhexidine (PERIDEX) 0.12 % solution 15 mL, 15 mL, Mouth Rinse, BID, Johnson, Clanford L, MD, 15 mL at 03/05/19 0859 .  Chlorhexidine Gluconate Cloth 2 % PADS 6 each, 6 each, Topical, Q0600, Corliss Parish, MD, 6 each at 03/05/19 0900 .  cholecalciferol (VITAMIN D3) tablet 2,000 Units, 2,000 Units, Oral, Daily, Oran Rein, MD, 2,000 Units at 03/05/19 0859 .  docusate sodium (COLACE) capsule 100 mg, 100 mg, Oral, Daily, Oran Rein, MD, 100 mg at 03/05/19 0859 .  doxercalciferol (HECTOROL) injection 2 mcg, 2 mcg, Intravenous, Q T,Th,Sa-HD, Corliss Parish, MD, 2 mcg at 03/05/19 0905 .  fluticasone furoate-vilanterol (BREO ELLIPTA) 100-25 MCG/INH 1 puff, 1 puff, Inhalation, Daily, Oran Rein, MD, 1 puff at 03/05/19 0742 .  lidocaine (PF) (XYLOCAINE) 1 % injection 5 mL, 5 mL, Intradermal, PRN, Corliss Parish, MD .  lidocaine-prilocaine (EMLA) cream 1 application, 1 application, Topical, PRN, Corliss Parish, MD .  MEDLINE mouth rinse, 15  mL, Mouth Rinse, q12n4p, Johnson, Clanford L, MD, 15 mL at 03/04/19 1541 .  metoprolol succinate (TOPROL-XL) 24 hr tablet 25 mg, 25 mg, Oral, Daily, Johnson, Clanford L, MD .  midodrine (PROAMATINE) tablet 10 mg, 10 mg, Oral, Q T,Th,Sa-HD, Johnson, Clanford L, MD, 10 mg at 03/05/19 0904 .  multivitamin with minerals tablet 1 tablet, 1 tablet, Oral, Daily, Oran Rein, MD, 1 tablet at 03/04/19 2221 .  pantoprazole (PROTONIX) EC tablet 40 mg, 40 mg, Oral, Daily, Johnson, Clanford L, MD, 40 mg at 03/05/19 0900 .  pentafluoroprop-tetrafluoroeth (GEBAUERS) aerosol 1 application, 1 application, Topical, PRN, Corliss Parish, MD .  senna-docusate (Senokot-S) tablet 1 tablet, 1 tablet, Oral, QHS PRN, Johnson, Clanford L, MD .  tamsulosin (FLOMAX) capsule 0.4 mg, 0.4 mg, Oral, Daily, Johnson, Clanford L, MD, 0.4 mg at 03/05/19 0904     ANALYSIS: A 16 channel recording using standard 10 20 measurements is conducted for 25 minutes.   There is a well-formed posterior dominant rhythm of 7 hertz which attenuates with eye-opening.  There is beta activity observed in the frontal areas.  Awake and drowsy architecture observed.  Photic stimulation and hyperventilation are not conducted.  There is no focal or lateralized slowing.  There is no epileptiform activity is observed.   IMPRESSION: 1.   This recording of awake and drowsy states shows mild global slowing indicating a mild global encephalopathy.  However, no epileptiform activities are documented.      Wilkins Elpers A. Merlene Laughter, M.D.  Diplomate, Tax adviser of Psychiatry and Neurology ( Neurology).

## 2019-03-05 NOTE — Progress Notes (Signed)
Arrived to Room for EEG unable to obtain pt is going to [facility] for MRI.

## 2019-03-06 DIAGNOSIS — N401 Enlarged prostate with lower urinary tract symptoms: Secondary | ICD-10-CM

## 2019-03-06 DIAGNOSIS — R338 Other retention of urine: Secondary | ICD-10-CM

## 2019-03-06 DIAGNOSIS — N329 Bladder disorder, unspecified: Secondary | ICD-10-CM

## 2019-03-06 LAB — GLUCOSE, CAPILLARY
Glucose-Capillary: 207 mg/dL — ABNORMAL HIGH (ref 70–99)
Glucose-Capillary: 158 mg/dL — ABNORMAL HIGH (ref 70–99)

## 2019-03-06 LAB — CBC WITH DIFFERENTIAL/PLATELET
Abs Immature Granulocytes: 0.01 10*3/uL (ref 0.00–0.07)
Basophils Absolute: 0 10*3/uL (ref 0.0–0.1)
Basophils Relative: 0 %
Eosinophils Absolute: 0 10*3/uL (ref 0.0–0.5)
Eosinophils Relative: 1 %
HCT: 34.5 % — ABNORMAL LOW (ref 39.0–52.0)
Hemoglobin: 11.5 g/dL — ABNORMAL LOW (ref 13.0–17.0)
Immature Granulocytes: 0 %
Lymphocytes Relative: 11 %
Lymphs Abs: 0.3 10*3/uL — ABNORMAL LOW (ref 0.7–4.0)
MCH: 31.3 pg (ref 26.0–34.0)
MCHC: 33.3 g/dL (ref 30.0–36.0)
MCV: 93.8 fL (ref 80.0–100.0)
Monocytes Absolute: 0.4 10*3/uL (ref 0.1–1.0)
Monocytes Relative: 16 %
Neutro Abs: 2 10*3/uL (ref 1.7–7.7)
Neutrophils Relative %: 72 %
Platelets: 72 10*3/uL — ABNORMAL LOW (ref 150–400)
RBC: 3.68 MIL/uL — ABNORMAL LOW (ref 4.22–5.81)
RDW: 20.6 % — ABNORMAL HIGH (ref 11.5–15.5)
WBC: 2.8 10*3/uL — ABNORMAL LOW (ref 4.0–10.5)
nRBC: 0 % (ref 0.0–0.2)

## 2019-03-06 LAB — PROTIME-INR
INR: 2.3 — ABNORMAL HIGH (ref 0.8–1.2)
Prothrombin Time: 25.1 seconds — ABNORMAL HIGH (ref 11.4–15.2)

## 2019-03-06 LAB — RENAL FUNCTION PANEL
Albumin: 2.9 g/dL — ABNORMAL LOW (ref 3.5–5.0)
Anion gap: 14 (ref 5–15)
BUN: 27 mg/dL — ABNORMAL HIGH (ref 8–23)
CO2: 25 mmol/L (ref 22–32)
Calcium: 8.6 mg/dL — ABNORMAL LOW (ref 8.9–10.3)
Chloride: 96 mmol/L — ABNORMAL LOW (ref 98–111)
Creatinine, Ser: 3.73 mg/dL — ABNORMAL HIGH (ref 0.61–1.24)
GFR calc Af Amer: 18 mL/min — ABNORMAL LOW (ref >60)
GFR calc non Af Amer: 16 mL/min — ABNORMAL LOW (ref >60)
Glucose, Bld: 191 mg/dL — ABNORMAL HIGH (ref 70–99)
Phosphorus: 3.1 mg/dL (ref 2.5–4.6)
Potassium: 4.8 mmol/L (ref 3.5–5.1)
Sodium: 135 mmol/L (ref 135–145)

## 2019-03-06 NOTE — Evaluation (Signed)
Occupational Therapy Evaluation Patient Details Name: Keith Young MRN: [id number] DOB: 19-Apr-1951 Today's Date: 03/06/2019    History of Present Illness 68 year old male sent to ED after he was found unresponsive after hemodialysis at the [facility].  He was seen by the tele-neurologist who was concerned for stroke.  He was not a candidate for TPA given that he was therapeutic on warfarin.  MRI brain was recommended. Patient admitted from [facility].   Clinical Impression   Pt unable to provide much background information as he is a poor historian. Per patient's chart, he is a resident at [facility] in which he was wheelchair bound. He was able to use his arms to propel his wheelchair around the facility. He used a bed pan for toileting and received assistance from staff for all ADL tasks. Pt with significant LUE swelling due to dialysis. UE was placed on pillow while in bed to help decrease swelling. Pt declined to sit on edge of bed or transfer into chair for lunch. Unsure if patient's functional performance is less than before admission. I do recommend that staff OT evaluate patient once he returns to assess any additional therapy needs. Pt is presenting with generalized weakness and deconditioning from hospital stay and will more than likely benefit from therapy once he returns.      Follow Up Recommendations  SNF    Equipment Recommendations  Other (comment)(defer to next venue of care)       Precautions / Restrictions Precautions Precautions: Fall Restrictions Weight Bearing Restrictions: No      Mobility Bed Mobility    General bed mobility comments: Not completed  Transfers    General transfer comment: Not completed. patient declined.        ADL either performed or assessed with clinical judgement   ADL Overall ADL's : At baseline;Needs assistance/impaired Eating/Feeding: Set up;Sitting   Grooming: Wash/dry face;Wash/dry hands;Minimal assistance;Bed level    Upper Body Bathing: Moderate assistance;Bed level   Lower Body Bathing: Maximal assistance;Bed level   Upper Body Dressing : Moderate assistance;Bed level   Lower Body Dressing: Maximal assistance;Bed level     Toilet Transfer Details (indicate cue type and reason): Not performed this date                 Vision Baseline Vision/History: No visual deficits Patient Visual Report: No change from baseline              Pertinent Vitals/Pain Pain Assessment: No/denies pain     Hand Dominance Right   Extremity/Trunk Assessment Upper Extremity Assessment Upper Extremity Assessment: Generalized weakness;LUE deficits/detail LUE Deficits / Details: Edema noted in entire LUE due to diaylsis. Limited ROM and strength noted in LUE. Week gross grasp. decreased functional use.   Lower Extremity Assessment Lower Extremity Assessment: Defer to PT evaluation       Communication Communication Communication: Expressive difficulties;HOH   Cognition Arousal/Alertness: Awake/alert Behavior During Therapy: WFL for tasks assessed/performed Overall Cognitive Status: No family/caregiver present to determine baseline cognitive functioning                  Home Living Family/patient expects to be discharged to:: Skilled nursing facility        Prior Functioning/Environment Level of Independence: Needs assistance  Gait / Transfers Assistance Needed: Per patient and nursing report, patient wheelchair bound ADL's / Homemaking Assistance Needed: Assistance with ADL tasks.            OT Problem List: Decreased range of motion;Decreased  strength;Decreased coordination;Decreased activity tolerance;Impaired balance (sitting and/or standing);Impaired UE functional use       AM-PAC OT "6 Clicks" Daily Activity     Outcome Measure Help from another person eating meals?: A Little Help from another person taking care of personal grooming?: A Lot Help from another person toileting,  which includes using toliet, bedpan, or urinal?: A Lot Help from another person bathing (including washing, rinsing, drying)?: A Lot Help from another person to put on and taking off regular upper body clothing?: A Lot Help from another person to put on and taking off regular lower body clothing?: A Lot 6 Click Score: 13   End of Session    Activity Tolerance: Patient tolerated treatment well Patient left: in bed;with call bell/phone within reach;with bed alarm set  OT Visit Diagnosis: Muscle weakness (generalized) (M62.81)                Time: 1342-1400 OT Time Calculation (min): 18 min Charges:  OT General Charges $OT Visit: 1 Visit OT Evaluation $OT Eval Low Complexity: 1 Low  Ailene Ravel, OTR/L,CBIS  [phone]  Dannel Rafter, Clarene Duke 03/06/2019, 2:12 PM

## 2019-03-06 NOTE — Progress Notes (Signed)
Physical Therapy Treatment Patient Details Name: Keith Young MRN: [id number] DOB: 1951/01/18 Today's Date: 03/06/2019    History of Present Illness 68 year old male sent to ED after he was found unresponsive after hemodialysis at the [facility].  He was seen by the tele-neurologist who was concerned for stroke.  He was not a candidate for TPA given that he was therapeutic on warfarin.  MRI brain was recommended. Patient admitted from [facility].    PT Comments    Pt supine and willing to participate with therapy today.  Pt required mod A with bed mobility for trunk rotation and cueing for handrail to assist.  Mod A with sit to stand and able to make 2 sidesteps in front of bed but required seated rest break prior fatigue.  Max A required with sit to stand following due to fatigue.  Did not transfer to chair for safety concerns.  EOS pt left in bed with call bell within reach and bed alarm set.  No reports of pain through sessoin.     Follow Up Recommendations  SNF;Supervision/Assistance - 24 hour     Equipment Recommendations  None recommended by PT    Recommendations for Other Services       Precautions / Restrictions Precautions Precautions: Fall Restrictions Weight Bearing Restrictions: No    Mobility  Bed Mobility Overal bed mobility: Needs Assistance Bed Mobility: Supine to Sit;Sit to Supine Rolling: Mod assist   Supine to sit: Mod assist     General bed mobility comments: Mod A with trunk rotation, able to grasp Rt handrail to assist with rolling, A with LE to floor/return to bed  Transfers Overall transfer level: Needs assistance Equipment used: Rolling walker (2 wheeled) Transfers: Sit to/from Stand Sit to Stand: Mod assist(x 3 sets)         General transfer comment: Mod A first STS; max A following due to fatigue.  Able to sidestep 2 steps towards chair but unable to make it prior fatigue  Ambulation/Gait Ambulation/Gait assistance: Mod assist Gait  Distance (Feet): 2 Feet Assistive device: Rolling walker (2 wheeled) Gait Pattern/deviations: Step-to pattern;Decreased step length - left;Decreased step length - right;Decreased stride length;Decreased weight shift to right Gait velocity: decreased   General Gait Details: Able to take 2 sidesteps towards chair then sat down prior fatigue.  Max A required with sit to stand following, did not attempt to make it to chair per fatigue   Stairs             Wheelchair Mobility    Modified Rankin (Stroke Patients Only)       Balance                                            Cognition Arousal/Alertness: Awake/alert Behavior During Therapy: WFL for tasks assessed/performed Overall Cognitive Status: No family/caregiver present to determine baseline cognitive functioning                                        Exercises      General Comments        Pertinent Vitals/Pain Pain Assessment: No/denies pain    Home Living Family/patient expects to be discharged to:: Skilled nursing facility  Prior Function Level of Independence: Needs assistance  Gait / Transfers Assistance Needed: Per patient and nursing report, patient wheelchair bound ADL's / Homemaking Assistance Needed: Assistance with ADL tasks.     PT Goals (current goals can now be found in the care plan section)      Frequency    Min 2X/week      PT Plan      Co-evaluation              AM-PAC PT "6 Clicks" Mobility   Outcome Measure  Help needed turning from your back to your side while in a flat bed without using bedrails?: A Lot Help needed moving from lying on your back to sitting on the side of a flat bed without using bedrails?: A Lot Help needed moving to and from a bed to a chair (including a wheelchair)?: A Lot Help needed standing up from a chair using your arms (e.g., wheelchair or bedside chair)?: A Lot Help needed to walk in  hospital room?: A Lot Help needed climbing 3-5 steps with a railing? : Total 6 Click Score: 11    End of Session Equipment Utilized During Treatment: Gait belt Activity Tolerance: Patient tolerated treatment well;Patient limited by fatigue Patient left: in bed;with call bell/phone within reach;with bed alarm set Nurse Communication: Mobility status PT Visit Diagnosis: Unsteadiness on feet (R26.81);Other abnormalities of gait and mobility (R26.89);Muscle weakness (generalized) (M62.81)     Time: 2863-8177 PT Time Calculation (min) (ACUTE ONLY): 23 min  Charges:  $Therapeutic Activity: 23-37 mins                     [address] LPTA; CBIS [phone]  Aldona Lento 03/06/2019, 4:59 PM

## 2019-03-06 NOTE — Progress Notes (Signed)
Subjective: Patient had a small amount of urine output in his bed this morning. Bladder scan over 500cc. No pain.   Objective: Vital signs in last 24 hours: Temp:  [97.7 F (36.5 C)-98.1 F (36.7 C)] 98.1 F (36.7 C) (10/07 2107) Pulse Rate:  [95-99] 95 (10/07 2107) Resp:  [16-17] 16 (10/07 2107) BP: (103-112)/(70-78) 112/70 (10/07 2107) SpO2:  [96 %-100 %] 96 % (10/07 2107)  Intake/Output from previous day: 10/06 0701 - 10/07 0700 In: 420 [P.O.:420] Out: 1031  Intake/Output this shift: No intake/output data recorded.  Physical Exam:  General:alert, cooperative and appears stated age GI: soft and distended Male genitalia: no penile lesions or discharge no testicular masses no bladder distension noted Extremities: extremities normal, atraumatic, no cyanosis or edema  Lab Results: Recent Labs    03/04/19 0511 03/05/19 0506 03/06/19 0621  HGB 12.7* 12.6* 11.5*  HCT 38.2* 37.4* 34.5*   BMET Recent Labs    03/05/19 0506 03/06/19 0621  NA 138 135  K 3.8 4.8  CL 101 96*  CO2 26 25  GLUCOSE 115* 191*  BUN 37* 27*  CREATININE 4.74* 3.73*  CALCIUM 8.8* 8.6*   Recent Labs    03/04/19 0732 03/05/19 0506 03/06/19 0621  INR 2.5* 2.8* 2.3*   No results for input(s): LABURIN in the last 72 hours. Results for orders placed or performed during the hospital encounter of 03/02/19  SARS CORONAVIRUS 2 (TAT 6-24 HRS) Nasopharyngeal Nasopharyngeal Swab     Status: None   Collection Time: 03/02/19  7:35 PM   Specimen: Nasopharyngeal Swab  Result Value Ref Range Status   SARS Coronavirus 2 NEGATIVE NEGATIVE Final    Comment: (NOTE) SARS-CoV-2 target nucleic acids are NOT DETECTED. The SARS-CoV-2 RNA is generally detectable in upper and lower respiratory specimens during the acute phase of infection. Negative results do not preclude SARS-CoV-2 infection, do not rule out co-infections with other pathogens, and should not be used as the sole basis for treatment or other  patient management decisions. Negative results must be combined with clinical observations, patient history, and epidemiological information. The expected result is Negative. Fact Sheet for Patients: SugarRoll.be Fact Sheet for Healthcare Providers: https://www.woods-mathews.com/ This test is not yet approved or cleared by the [place] FDA and  has been authorized for detection and/or diagnosis of SARS-CoV-2 by FDA under an Emergency Use Authorization (EUA). This EUA will remain  in effect (meaning this test can be used) for the duration of the COVID-19 declaration under Section 56 4(b)(1) of the Act, 21 U.S.C. section 360bbb-3(b)(1), unless the authorization is terminated or revoked sooner. Performed at [facility], [place] [address] [place], [place] [address]   MRSA PCR Screening     Status: None   Collection Time: 03/02/19 11:39 PM   Specimen: Nasopharyngeal  Result Value Ref Range Status   MRSA by PCR NEGATIVE NEGATIVE Final    Comment:        The GeneXpert MRSA Assay (FDA approved for NASAL specimens only), is one component of a comprehensive MRSA colonization surveillance program. It is not intended to diagnose MRSA infection nor to guide or monitor treatment for MRSA infections. Performed at [facility], [address] [place], [place] [address]     Studies/Results: Mr Brain 42 Contrast  Result Date: 03/06/2019 CLINICAL DATA:  Altered level of consciousness. EXAM: MRI HEAD WITHOUT CONTRAST TECHNIQUE: Multiplanar, multiecho pulse sequences of the brain and surrounding structures were obtained without intravenous contrast. COMPARISON:  CT head 03/02/2019 FINDINGS:  Brain: Moderate to advanced atrophy. Ventricular enlargement consistent with atrophy. Negative for acute infarct. Chronic microvascular ischemic changes in the white matter. Chronic lacunar infarctions in the pons right greater than left. Chronic infarcts  cerebellum bilaterally. Chronic infarct right posterior temporal lobe. Negative for hemorrhage or mass Vascular: Normal arterial flow voids Skull and upper cervical spine: Negative Sinuses/Orbits: Mucosal edema paranasal sinuses.  Negative orbit Other: None IMPRESSION: No acute infarct Atrophy with extensive chronic ischemic changes. Electronically Signed   By: Franchot Gallo M.D.   On: 03/06/2019 14:53    Preprocedure diagnosis: urinary retention Postprocedure diagnosis: small capacity bladder, no retention  Procedure: 1 cystoscopy 2. placement of 16 french council catheter over wire.  Attending: Nicolette Bang  Anesthesia: local  Estimated blood loss: Minimal  Drains: 16 french foley  Specimens: none  Findings:  Ureteral orifices in normal anatomic location.  Bilobar hyperplasia. 50-60cc bladder capacity.  Indications: Patient is a 68 year old male with a history of BPH and concern for urinary retention.  After discussing treatment options, they decided proceed with cystoscopy and foley placement.  Procedure in detail: local anesthesia was administered patient was placed in supine position.  Their genitalia was then prepped and draped in usual sterile fashion.  A flexible cystoscope was passed in the urethra and the bladder.  Bladder was inspected and we noted no masses or lesions.  the ureteral orifices were in the normal orthotopic locations. A zipwire was passed through the scope and into the bladder. The scope was then removed and a 16 french council wqs advanced over the zipewire and into the bladder. The wire was then removed. 10cc of water was placed into the foley balloon. The bladder was then drained and this concluded the procedure which was well tolerated by patient.  Complications: None  Condition: Stable   Assessment/Plan: 68yo with BPH, small capacity bladder  1. 16 french foley placed with cystoscopic guidance. Foley can be removed in 3-5 days.    LOS: 4 days    Nicolette Bang 03/06/2019, 10:04 PM

## 2019-03-06 NOTE — Progress Notes (Signed)
PROGRESS NOTE    Keith Young  KDX:[id number] DOB: 04/24/1951 DOA: 03/02/2019 PCP: [name], MD   Brief Narrative:  68 year old male sent to ED after he was found unresponsive after hemodialysis at the [facility].  He was seen by the tele-neurologist who was concerned for stroke.  He was not a candidate for TPA given that he was therapeutic on warfarin.  MRI brain was recommended.  10/7: Brain MRI performed yesterday at [facility] with results still pending.  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 Holding warfarin for now.  Plans for further hemodialysis in a.m.  Assessment & Plan:   Principal Problem:   CVA (cerebral vascular accident) ([place]) Active Problems:   Essential hypertension, benign   CAD (coronary artery disease)   ICD (implantable cardioverter-defibrillator) in place   HIV disease ([place])   COPD (chronic obstructive pulmonary disease) (HCC)   PAF (paroxysmal atrial fibrillation) (HCC)   Type 2 diabetes mellitus with hypertension and end stage renal disease on dialysis ([place])   ESRD (end stage renal disease) on dialysis (HCC)   Chronic anticoagulation   NSVT (nonsustained ventricular tachycardia) (HCC)   At moderate risk for fall   Bell's palsy   1. Suspected acute CVA -patient has a pronounced facial droop and has failed a swallow screen however he has a history of Bells' Palsy.  Rule out for acute cerebrovascular accident.  Unfortunately we do not have MRI available at this hospital due to his ICD.    MRI performed at [facility] on 10/6 with results still pending. Continue stroke work-up as ordered.  Continue aspirin.  SLP evaluation PT OT evaluation and echocardiogram ordered.  Holding warfarin until after MRI per tele-neurologist recommendations.    EEG with no acute findings noted. Inpatient neurology consult appreciated.   Continue neurochecks.  Continue supportive  therapy. 2. Hypoglycemia-likely from being n.p.o. currently resolved. 3. End-stage renal disease on hemodialysis TTS-Consulted nephrology as he will require dialysis TTS. 4. Ischemic cardiomyopathy-patient has an ICD in place in 2014. Also may need to have device interrogated to see if that may be the reason he was found unresponsive after his outpatient HD treatment. 5. PAF-he has been maintained on warfarin for his bioprosthetic AVR and for stroke prevention.  His INR today is 2.5.  Tele-neurology asked to hold warfarin until after the MRI is been completed.  Likely can resume warfarin if no major bleeding. 6. Essential hypertension-stable.  He does take Midrin on dialysis days due to soft BPs. 7. HIV- he has been maintained on HAART.  8. COPD - stable, resumed home bronchodilators.  9. Type 2 DM -blood sugars better today now that he is eating.    10. Acute urinary retention with failed catheter placement.  Appreciate urology for flex cystoscopy placement of catheter.  May need to consider CT abdomen and pelvis if not improved.  DVT prophylaxis: warfarin held until MRI Brain returns Code Status: DNR  Family Communication: patient updated bedside Disposition Plan: inpatient  Consultants:   Neurology  Urology  Procedures:   None  Antimicrobials:   None   Subjective: Patient seen and evaluated today with no new acute complaints or concerns. No acute concerns or events noted overnight.  He appears confused and did have some breakfast this morning.  Objective: Vitals:   03/05/19 1850 03/05/19 2119 03/06/19 0407 03/06/19 0837  BP: 106/75 95/78 103/78   Pulse: 95 96 99   Resp: 20  20 17   Temp: 98 F (36.7 C) 98.5 F (36.9 C) 97.7 F (36.5 C)   TempSrc: Oral Oral Oral   SpO2: 93% 99% 100% 100%  Weight:      Height:        Intake/Output Summary (Last 24 hours) at 03/06/2019 1450 Last data filed at 03/06/2019 1005 Gross per 24 hour  Intake 180 ml  Output --  Net 180 ml    Filed Weights   03/02/19 1538  Weight: 68 kg    Examination:  General exam: Appears calm and comfortable  Respiratory system: Clear to auscultation. Respiratory effort normal. Cardiovascular system: S1 & S2 heard, RRR. No JVD, murmurs, rubs, gallops or clicks. No pedal edema. Gastrointestinal system: Abdomen is nondistended, soft and nontender. No organomegaly or masses felt. Normal bowel sounds heard. Central nervous system: Alert and pleasantly confused. Extremities: Symmetric 5 x 5 power. Skin: No rashes, lesions or ulcers Psychiatry: Judgement and insight appear normal. Mood & affect appropriate.     Data Reviewed: I have personally reviewed following labs and imaging studies  CBC: Recent Labs  Lab 03/02/19 1534 03/04/19 0511 03/05/19 0506 03/06/19 0621  WBC 2.4* 2.7* 2.8* 2.8*  NEUTROABS 1.6*  --   --  2.0  HGB 12.4* 12.7* 12.6* 11.5*  HCT 36.3* 38.2* 37.4* 34.5*  MCV 92.6 95.0 93.5 93.8  PLT 79* 79* 81* 72*   Basic Metabolic Panel: Recent Labs  Lab 03/02/19 1534 03/03/19 0600 03/04/19 0511 03/05/19 0506 03/06/19 0621  NA 136 138 138 138 135  K 3.1* 3.4* 4.0 3.8 4.8  CL 98 99 100 101 96*  CO2 26 26 24 26 25   GLUCOSE 106* 65* 80 115* 191*  BUN 19 24* 30* 37* 27*  CREATININE 2.53* 3.13* 3.96* 4.74* 3.73*  CALCIUM 8.3* 8.6* 8.8* 8.8* 8.6*  MG  --   --  2.1  --   --   PHOS  --   --  3.9 4.1 3.1   GFR: Estimated Creatinine Clearance: 15.5 mL/min (A) (by C-G formula based on SCr of 3.73 mg/dL (H)). Liver Function Tests: Recent Labs  Lab 03/02/19 1534 03/03/19 0600 03/05/19 0506 03/06/19 0621  AST 47* 42*  --   --   ALT 45* 40  --   --   ALKPHOS 161* 150*  --   --   BILITOT 1.8* 2.1*  --   --   PROT 7.0 6.6  --   --   ALBUMIN 2.9* 2.9* 2.8* 2.9*   No results for input(s): LIPASE, AMYLASE in the last 168 hours. No results for input(s): AMMONIA in the last 168 hours. Coagulation Profile: Recent Labs  Lab 03/02/19 1534 03/03/19 0600  03/04/19 0732 03/05/19 0506 03/06/19 0621  INR 2.0* 2.1* 2.5* 2.8* 2.3*   Cardiac Enzymes: No results for input(s): CKTOTAL, CKMB, CKMBINDEX, TROPONINI in the last 168 hours. BNP (last 3 results) No results for input(s): PROBNP in the last 8760 hours. HbA1C: No results for input(s): HGBA1C in the last 72 hours. CBG: Recent Labs  Lab 03/05/19 0735 03/05/19 1117 03/05/19 2121 03/06/19 0738 03/06/19 1108  GLUCAP 106* 106* 162* 207* 158*   Lipid Profile: No results for input(s): CHOL, HDL, LDLCALC, TRIG, CHOLHDL, LDLDIRECT in the last 72 hours. Thyroid Function Tests: No results for input(s): TSH, T4TOTAL, FREET4, T3FREE, THYROIDAB in the last 72 hours. Anemia Panel: No results for input(s): VITAMINB12, FOLATE, FERRITIN, TIBC, IRON, RETICCTPCT in the last 72 hours. Sepsis Labs: No results for input(s):  PROCALCITON, LATICACIDVEN in the last 168 hours.  Recent Results (from the past 240 hour(s))  SARS CORONAVIRUS 2 (TAT 6-24 HRS) Nasopharyngeal Nasopharyngeal Swab     Status: None   Collection Time: 03/02/19  7:35 PM   Specimen: Nasopharyngeal Swab  Result Value Ref Range Status   SARS Coronavirus 2 NEGATIVE NEGATIVE Final    Comment: (NOTE) SARS-CoV-2 target nucleic acids are NOT DETECTED. The SARS-CoV-2 RNA is generally detectable in upper and lower respiratory specimens during the acute phase of infection. Negative results do not preclude SARS-CoV-2 infection, do not rule out co-infections with other pathogens, and should not be used as the sole basis for treatment or other patient management decisions. Negative results must be combined with clinical observations, patient history, and epidemiological information. The expected result is Negative. Fact Sheet for Patients: SugarRoll.be Fact Sheet for Healthcare Providers: https://www.woods-mathews.com/ This test is not yet approved or cleared by the [place] FDA and  has been  authorized for detection and/or diagnosis of SARS-CoV-2 by FDA under an Emergency Use Authorization (EUA). This EUA will remain  in effect (meaning this test can be used) for the duration of the COVID-19 declaration under Section 56 4(b)(1) of the Act, 21 U.S.C. section 360bbb-3(b)(1), unless the authorization is terminated or revoked sooner. Performed at [facility], [place] [address], [place], [place] [address]   MRSA PCR Screening     Status: None   Collection Time: 03/02/19 11:39 PM   Specimen: Nasopharyngeal  Result Value Ref Range Status   MRSA by PCR NEGATIVE NEGATIVE Final    Comment:        The GeneXpert MRSA Assay (FDA approved for NASAL specimens only), is one component of a comprehensive MRSA colonization surveillance program. It is not intended to diagnose MRSA infection nor to guide or monitor treatment for MRSA infections. Performed at [facility], [address] [place], [place] [address]          Radiology Studies: No results found.      Scheduled Meds:  atorvastatin  80 mg Oral Daily   bictegravir-emtricitabine-tenofovir AF  1 tablet Oral Daily   chlorhexidine  15 mL Mouth Rinse BID   Chlorhexidine Gluconate Cloth  6 each Topical Q0600   cholecalciferol  2,000 Units Oral Daily   docusate sodium  100 mg Oral Daily   doxercalciferol  2 mcg Intravenous Q T,Th,Sa-HD   fluticasone furoate-vilanterol  1 puff Inhalation Daily   mouth rinse  15 mL Mouth Rinse q12n4p   metoprolol succinate  25 mg Oral Daily   midodrine  10 mg Oral Q T,Th,Sa-HD   multivitamin with minerals  1 tablet Oral Daily   pantoprazole  40 mg Oral Daily   tamsulosin  0.4 mg Oral Daily   Continuous Infusions:  sodium chloride     sodium chloride       LOS: 4 days    Time spent: 30 minutes    Kassius Battiste Darleen Crocker, DO [facility] Pager [phone]  If 7PM-7AM, please contact night-coverage [email or web address] Password [email or web address] 03/06/2019, 2:50 PM

## 2019-03-06 NOTE — Progress Notes (Signed)
Admit: 03/02/2019 LOS: 4  89M ESRD with AMS  Subjective:  . HD yesterday, 1L UF, 3.5h . MRI overnight, result pending . No c/o this AM, ate some breakfast   10/06 0701 - 10/07 0700 In: 420 [P.O.:420] Out: 1031   Filed Weights   03/02/19 1538  Weight: 68 kg    Scheduled Meds: . atorvastatin  80 mg Oral Daily  . bictegravir-emtricitabine-tenofovir AF  1 tablet Oral Daily  . chlorhexidine  15 mL Mouth Rinse BID  . Chlorhexidine Gluconate Cloth  6 each Topical Q0600  . cholecalciferol  2,000 Units Oral Daily  . docusate sodium  100 mg Oral Daily  . doxercalciferol  2 mcg Intravenous Q T,Th,Sa-HD  . fluticasone furoate-vilanterol  1 puff Inhalation Daily  . mouth rinse  15 mL Mouth Rinse q12n4p  . metoprolol succinate  25 mg Oral Daily  . midodrine  10 mg Oral Q T,Th,Sa-HD  . multivitamin with minerals  1 tablet Oral Daily  . pantoprazole  40 mg Oral Daily  . tamsulosin  0.4 mg Oral Daily   Continuous Infusions: . sodium chloride    . sodium chloride     PRN Meds:.sodium chloride, sodium chloride, acetaminophen, albuterol, lidocaine (PF), lidocaine-prilocaine, pentafluoroprop-tetrafluoroeth, senna-docusate  Current Labs: reviewed    Physical Exam:  Blood pressure 103/78, pulse 99, temperature 97.7 F (36.5 C), temperature source Oral, resp. rate 17, height 5\' 3"  (1.6 m), weight 68 kg, SpO2 100 %. Chronically ill-appearing, NAD Regular, normal S1-S2 Coarse breath sounds bilaterally, diminished in the bases + B/T in left arm AVG S/nt/nd   Dialyzes at [facility]-TTS-4 hours EDW 66.5. HD Bath 2K/2.5 calcium, Dialyzer unknown, Heparin unknown. Access left AV graft-15-gauge, 400/600.  Hectorol 2 mics with treatment  A 1. ESRD TTS [facility] on schedule, LUE AVG 2. AMS ,possible CVA, TRH and Neuro following 3. BP, on midodrine 4. ANemia, Hb stable 5. HIV on ARV 6. CKD BMD, stable  P . Cont HD on schedule: 2K 1-2L UF, AVG 400/600,  . Medication  Issues; o Preferred narcotic agents for pain control are hydromorphone, fentanyl, and methadone. Morphine should not be used.  o Baclofen should be avoided o Avoid oral sodium phosphate and magnesium citrate based laxatives / bowel preps    Pearson Grippe MD 03/06/2019, 9:46 AM  Recent Labs  Lab 03/04/19 0511 03/05/19 0506 03/06/19 0621  NA 138 138 135  K 4.0 3.8 4.8  CL 100 101 96*  CO2 24 26 25   GLUCOSE 80 115* 191*  BUN 30* 37* 27*  CREATININE 3.96* 4.74* 3.73*  CALCIUM 8.8* 8.8* 8.6*  PHOS 3.9 4.1 3.1   Recent Labs  Lab 03/02/19 1534 03/04/19 0511 03/05/19 0506 03/06/19 0621  WBC 2.4* 2.7* 2.8* 2.8*  NEUTROABS 1.6*  --   --  2.0  HGB 12.4* 12.7* 12.6* 11.5*  HCT 36.3* 38.2* 37.4* 34.5*  MCV 92.6 95.0 93.5 93.8  PLT 79* 79* 81* 72*

## 2019-03-06 NOTE — Progress Notes (Signed)
Strang A. Merlene Laughter, MD     [email or web address]          Keith Young is an 68 y.o. male.   Assessment/Plan:  1.  Syncope/unresponsiveness: Unclear etiology. 2.  Focal neurological deficit.  Initially thought to be due to acute ischemic stroke but the patient has had multiple infarcts and I suspect the deficits are due to this. Risk factors include age, previous infarct, diabetes, low ejection fraction and hypertension.  The patient has been started on aspirin in addition to his baseline warfarin.  We should discontinue the aspirin after 1 month to reduce risk of hemorrhage. 3.  Multiple infarcts including right watershed posterior cortical infarct and multiple lacunar infarcts  4.  Right extracranial left ICA stenosis -asymptomatic 5.  Vascular dementia   He is more responsive today.  GENERAL: The patient is laying in bed with eyes opened.  HEENT: Supple no trauma appreciated  ABDOMEN: soft  EXTREMITIES: There is marked edema of the left upper extremity.  BACK: Normal  SKIN: Normal by inspection.    MENTAL STATUS:  He is awake and alert.  He is much more responsive but still has significant cognitive impairment.  He knows he is in the hospital but is unable to state why he is in the hospital.  He is not able to provide an adequate history.  He does not know his age or the month.  There is a mark dysarthria.  CRANIAL NERVES: Pupils are equal, round and reactive to light; extra ocular movements are full, there is no significant nystagmus; visual fields are full; there is a significant weakness of the lower facial muscles on the left side associated with the flattening of the nasolabial fold. The tongue is midline MOTOR: Strength in all 4 extremities is graded as 4-/5.  There is significant drift in all 4 extremities.  COORDINATION: Left finger to nose is normal, right finger to nose is normal, No rest tremor; no intention tremor; no postural  tremor; no bradykinesia.  REFLEXES: Deep tendon reflexes are symmetrical and normal.   SENSATION: Normal pain.   Objective: Vital signs in last 24 hours: Temp:  [97.7 F (36.5 C)-98.5 F (36.9 C)] 97.7 F (36.5 C) (10/07 0407) Pulse Rate:  [96-99] 99 (10/07 0407) Resp:  [17-20] 17 (10/07 0407) BP: (95-103)/(78) 103/78 (10/07 0407) SpO2:  [99 %-100 %] 100 % (10/07 0837)  Intake/Output from previous day: 10/06 0701 - 10/07 0700 In: 420 [P.O.:420] Out: 1031  Intake/Output this shift: No intake/output data recorded. Nutritional status:  Diet Order            DIET DYS 2 Room service appropriate? Yes; Fluid consistency: Honey Thick  Diet effective now               Lab Results: Results for orders placed or performed during the hospital encounter of 03/02/19 (from the past 48 hour(s))  Glucose, capillary     Status: Abnormal   Collection Time: 03/04/19  9:44 PM  Result Value Ref Range   Glucose-Capillary 183 (H) 70 - 99 mg/dL   Comment 1 Notify RN    Comment 2 Document in Chart   Protime-INR     Status: Abnormal   Collection Time: 03/05/19  5:06 AM  Result Value Ref Range   Prothrombin Time 28.9 (H) 11.4 - 15.2 seconds   INR 2.8 (H) 0.8 - 1.2    Comment: (NOTE) INR goal varies based on device and disease states. Performed at [facility], [address] [place], [place] [address]   Renal function panel     Status: Abnormal   Collection Time: 03/05/19  5:06 AM  Result Value Ref Range   Sodium 138 135 - 145 mmol/L   Potassium 3.8 3.5 - 5.1 mmol/L   Chloride 101 98 - 111 mmol/L   CO2 26 22 - 32 mmol/L   Glucose, Bld 115 (H) 70 - 99 mg/dL   BUN 37 (H) 8 - 23 mg/dL   Creatinine, Ser 4.74 (H) 0.61 - 1.24 mg/dL   Calcium 8.8 (L) 8.9 - 10.3 mg/dL   Phosphorus 4.1 2.5 - 4.6 mg/dL   Albumin 2.8 (L) 3.5 - 5.0 g/dL   GFR calc non Af Amer 12 (L) >60 mL/min   GFR calc Af Amer 14 (L) >60 mL/min   Anion gap 11 5 - 15    Comment: Performed at [facility], [address], [place], [place] [address]  CBC     Status: Abnormal   Collection Time: 03/05/19  5:06 AM  Result Value Ref Range   WBC 2.8 (L) 4.0 - 10.5 K/uL   RBC 4.00 (L) 4.22 - 5.81 MIL/uL   Hemoglobin 12.6 (L) 13.0 - 17.0 g/dL   HCT 37.4 (L) 39.0 - 52.0 %   MCV 93.5 80.0 - 100.0 fL   MCH 31.5 26.0 - 34.0 pg   MCHC 33.7 30.0 - 36.0 g/dL   RDW 20.9 (H) 11.5 - 15.5 %   Platelets 81 (L) 150 - 400 K/uL    Comment: SPECIMEN CHECKED FOR CLOTS Immature Platelet Fraction may be clinically indicated, consider ordering this additional test [id number] CONSISTENT WITH PREVIOUS RESULT    nRBC 0.0 0.0 - 0.2 %    Comment: Performed at [facility], [address] [place], [place] [address]  Glucose, capillary     Status: Abnormal   Collection Time: 03/05/19  7:35 AM  Result Value Ref Range   Glucose-Capillary 106 (H) 70 - 99 mg/dL  Glucose, capillary     Status: Abnormal   Collection Time: 03/05/19 11:17 AM  Result Value Ref Range   Glucose-Capillary 106 (H) 70 - 99 mg/dL  Glucose, capillary     Status: Abnormal   Collection Time: 03/05/19  9:21 PM  Result Value Ref Range   Glucose-Capillary 162 (H) 70 - 99 mg/dL   Comment 1 Notify RN    Comment 2 Document in Chart   Protime-INR     Status: Abnormal   Collection Time: 03/06/19  6:21 AM  Result Value Ref Range   Prothrombin Time 25.1 (H) 11.4 - 15.2 seconds   INR 2.3 (H) 0.8 - 1.2    Comment: (NOTE) INR goal varies based on device and disease states. Performed at [facility], [address], [place], [place] [address]   CBC with Differential/Platelet     Status: Abnormal   Collection Time: 03/06/19  6:21 AM  Result Value Ref Range   WBC 2.8 (L) 4.0 - 10.5 K/uL   RBC 3.68 (L) 4.22 - 5.81 MIL/uL   Hemoglobin 11.5 (L) 13.0 - 17.0 g/dL   HCT 34.5 (L) 39.0 - 52.0 %   MCV 93.8 80.0 - 100.0 fL   MCH 31.3 26.0 - 34.0 pg   MCHC 33.3 30.0 - 36.0 g/dL   RDW 20.6 (H) 11.5 - 15.5 %   Platelets 72 (L) 150 - 400 K/uL    Comment: SPECIMEN CHECKED FOR  CLOTS Immature Platelet Fraction may be clinically indicated,  consider ordering this additional test [id number] CONSISTENT WITH PREVIOUS RESULT    nRBC 0.0 0.0 - 0.2 %   Neutrophils Relative % 72 %   Neutro Abs 2.0 1.7 - 7.7 K/uL   Lymphocytes Relative 11 %   Lymphs Abs 0.3 (L) 0.7 - 4.0 K/uL   Monocytes Relative 16 %   Monocytes Absolute 0.4 0.1 - 1.0 K/uL   Eosinophils Relative 1 %   Eosinophils Absolute 0.0 0.0 - 0.5 K/uL   Basophils Relative 0 %   Basophils Absolute 0.0 0.0 - 0.1 K/uL   Immature Granulocytes 0 %   Abs Immature Granulocytes 0.01 0.00 - 0.07 K/uL    Comment: Performed at [facility], [address] [place], [place] [address]  Renal function panel     Status: Abnormal   Collection Time: 03/06/19  6:21 AM  Result Value Ref Range   Sodium 135 135 - 145 mmol/L   Potassium 4.8 3.5 - 5.1 mmol/L    Comment: DELTA CHECK NOTED   Chloride 96 (L) 98 - 111 mmol/L   CO2 25 22 - 32 mmol/L   Glucose, Bld 191 (H) 70 - 99 mg/dL   BUN 27 (H) 8 - 23 mg/dL   Creatinine, Ser 3.73 (H) 0.61 - 1.24 mg/dL   Calcium 8.6 (L) 8.9 - 10.3 mg/dL   Phosphorus 3.1 2.5 - 4.6 mg/dL   Albumin 2.9 (L) 3.5 - 5.0 g/dL   GFR calc non Af Amer 16 (L) >60 mL/min   GFR calc Af Amer 18 (L) >60 mL/min   Anion gap 14 5 - 15    Comment: Performed at [facility], [address] [place], [place] [address]  Glucose, capillary     Status: Abnormal   Collection Time: 03/06/19  7:38 AM  Result Value Ref Range   Glucose-Capillary 207 (H) 70 - 99 mg/dL  Glucose, capillary     Status: Abnormal   Collection Time: 03/06/19 11:08 AM  Result Value Ref Range   Glucose-Capillary 158 (H) 70 - 99 mg/dL    Lipid Panel No results for input(s): CHOL, TRIG, HDL, CHOLHDL, VLDL, LDLCALC in the last 72 hours.  Studies/Results:  BRAIN MRI FINDINGS: Brain: Moderate to advanced atrophy. Ventricular enlargement consistent with atrophy.  Negative for acute infarct.  Chronic microvascular ischemic changes in the  white matter. Chronic lacunar infarctions in the pons right greater than left. Chronic infarcts cerebellum bilaterally. Chronic infarct right posterior temporal lobe. Negative for hemorrhage or mass  Vascular: Normal arterial flow voids  Skull and upper cervical spine: Negative  Sinuses/Orbits: Mucosal edema paranasal sinuses.  Negative orbit  Other: None  IMPRESSION: No acute infarct  Atrophy with extensive chronic ischemic changes.   The brain MRI scan is reviewed in person.  No acute changes are noted on DWI.  No hemorrhages appreciated.  There is severe global atrophy.  There are areas of encephalomalacia consistent with lacunar infarct involving the inferior cerebellum on the right, right pontine tegmentum, right posterior centrum semiovale and left anterior centrum semiovale.  Additionally, there appears to be a more discrete area of encephalomalacia associated with gliosis and white matter changes involving the posterior watershed region on the right side.     Medications:  Scheduled Meds: . atorvastatin  80 mg Oral Daily  . bictegravir-emtricitabine-tenofovir AF  1 tablet Oral Daily  . chlorhexidine  15 mL Mouth Rinse BID  . Chlorhexidine Gluconate Cloth  6 each Topical Q0600  . cholecalciferol  2,000 Units Oral Daily  .  docusate sodium  100 mg Oral Daily  . doxercalciferol  2 mcg Intravenous Q T,Th,Sa-HD  . fluticasone furoate-vilanterol  1 puff Inhalation Daily  . mouth rinse  15 mL Mouth Rinse q12n4p  . metoprolol succinate  25 mg Oral Daily  . midodrine  10 mg Oral Q T,Th,Sa-HD  . multivitamin with minerals  1 tablet Oral Daily  . pantoprazole  40 mg Oral Daily  . tamsulosin  0.4 mg Oral Daily   Continuous Infusions: . sodium chloride    . sodium chloride     PRN Meds:.sodium chloride, sodium chloride, acetaminophen, albuterol, lidocaine (PF), lidocaine-prilocaine, pentafluoroprop-tetrafluoroeth, senna-docusate     LOS: 4 days   Vicie Cech A. Merlene Laughter,  M.D.  Diplomate, Tax adviser of Psychiatry and Neurology ( Neurology).

## 2019-03-06 NOTE — TOC Progression Note (Signed)
Transition of Care [facility]) - Progression Note    Patient Details  Name: Keith Young MRN: [id number] Date of Birth: Nov 20, 1950  [facility] CM/SW Contact  Shade Flood, LCSW Phone Number: 03/06/2019, 11:47 AM  Clinical Narrative:     TOC following. Pt discussed in Progression today. Per MD, pt will likely be ready for dc in 1-2 days and will be discharging with a chronic foley catheter. Updated Kerri at [facility]. TOC will follow.  Expected Discharge Plan: [place] Barriers to Discharge: Continued Medical Work up  Expected Discharge Plan and Services Expected Discharge Plan: [place] In-house Referral: Clinical Social Work     Living arrangements for the past 2 months: [place] Expected Discharge Date: 03/05/19                                     Social Determinants of Health (SDOH) Interventions    Readmission Risk Interventions Readmission Risk Prevention Plan 03/04/2019  Transportation Screening Complete  Medication Review Press photographer) Complete  PCP or Specialist appointment within 3-5 days of discharge Not Complete  PCP/Specialist Appt Not Complete comments SNF MD to follow  SW Recovery Care/Counseling Consult Complete  Palliative Care Screening Not Applicable  Skilled Nursing Facility Complete  Some recent data might be hidden

## 2019-03-07 ENCOUNTER — Inpatient Hospital Stay (HOSPITAL_COMMUNITY): Payer: Medicare HMO

## 2019-03-07 LAB — BASIC METABOLIC PANEL
Anion gap: 11 (ref 5–15)
BUN: 36 mg/dL — ABNORMAL HIGH (ref 8–23)
CO2: 27 mmol/L (ref 22–32)
Calcium: 8.8 mg/dL — ABNORMAL LOW (ref 8.9–10.3)
Chloride: 97 mmol/L — ABNORMAL LOW (ref 98–111)
Creatinine, Ser: 4.68 mg/dL — ABNORMAL HIGH (ref 0.61–1.24)
GFR calc Af Amer: 14 mL/min — ABNORMAL LOW (ref >60)
GFR calc non Af Amer: 12 mL/min — ABNORMAL LOW (ref >60)
Glucose, Bld: 110 mg/dL — ABNORMAL HIGH (ref 70–99)
Potassium: 4 mmol/L (ref 3.5–5.1)
Sodium: 135 mmol/L (ref 135–145)

## 2019-03-07 LAB — CBC
HCT: 35.5 % — ABNORMAL LOW (ref 39.0–52.0)
Hemoglobin: 12.1 g/dL — ABNORMAL LOW (ref 13.0–17.0)
MCH: 32 pg (ref 26.0–34.0)
MCHC: 34.1 g/dL (ref 30.0–36.0)
MCV: 93.9 fL (ref 80.0–100.0)
Platelets: 73 10*3/uL — ABNORMAL LOW (ref 150–400)
RBC: 3.78 MIL/uL — ABNORMAL LOW (ref 4.22–5.81)
RDW: 20.3 % — ABNORMAL HIGH (ref 11.5–15.5)
WBC: 2.3 10*3/uL — ABNORMAL LOW (ref 4.0–10.5)
nRBC: 0 % (ref 0.0–0.2)

## 2019-03-07 LAB — GLUCOSE, CAPILLARY
Glucose-Capillary: 116 mg/dL — ABNORMAL HIGH (ref 70–99)
Glucose-Capillary: 145 mg/dL — ABNORMAL HIGH (ref 70–99)
Glucose-Capillary: 101 mg/dL — ABNORMAL HIGH (ref 70–99)
Glucose-Capillary: 137 mg/dL — ABNORMAL HIGH (ref 70–99)

## 2019-03-07 LAB — PROTIME-INR
INR: 1.8 — ABNORMAL HIGH (ref 0.8–1.2)
Prothrombin Time: 21 seconds — ABNORMAL HIGH (ref 11.4–15.2)

## 2019-03-07 MED ORDER — RESOURCE THICKENUP CLEAR PO POWD
ORAL | Status: DC | PRN
  Filled 2019-03-07: qty 125

## 2019-03-07 MED ORDER — WARFARIN SODIUM 2.5 MG PO TABS
3.5000 mg | ORAL_TABLET | Freq: Once | ORAL | Status: AC
  Administered 2019-03-07: 3.5 mg via ORAL
  Filled 2019-03-07: qty 1

## 2019-03-07 MED ORDER — ALBUMIN HUMAN 25 % IV SOLN
25.0000 g | Freq: Once | INTRAVENOUS | Status: AC
  Administered 2019-03-07: 25 g via INTRAVENOUS
  Filled 2019-03-07: qty 100

## 2019-03-07 MED ORDER — WARFARIN - PHARMACIST DOSING INPATIENT: Freq: Every day | Status: DC

## 2019-03-07 NOTE — Progress Notes (Addendum)
ANTICOAGULATION CONSULT NOTE - Pharmacy Consult for warfarin Indication: atrial fibrillation/AVR  No Known Allergies  Patient Measurements: Height: 5\' 3"  (160 cm) Weight: 149 lb 14.4 oz (68 kg) IBW/kg (Calculated) : 56.9   Vital Signs: Temp: 97.8 F (36.6 C) (10/08 0522) BP: 107/72 (10/08 0522) Pulse Rate: 87 (10/08 0522)  Labs: Recent Labs    03/05/19 0506 03/06/19 0621 03/07/19 0714  HGB 12.6* 11.5* 12.1*  HCT 37.4* 34.5* 35.5*  PLT 81* 72* 73*  LABPROT 28.9* 25.1* 21.0*  INR 2.8* 2.3* 1.8*  CREATININE 4.74* 3.73* 4.68*    Estimated Creatinine Clearance: 12.3 mL/min (A) (by C-G formula based on SCr of 4.68 mg/dL (H)).    Assessment: Pharmacy consulted to dose warfarin for patient with atrial fibrillation and bioprosthetic AVR.  Patient's warfarin has been held pending MRI and restarting today Home dose listed as 3.5 mg daily  INR : 1.8  Goal of Therapy:  INR goal 2.5-3.0 per note on 9/1 Monitor platelets by anticoagulation protocol: Yes   Plan:  Warfarin 3.5 mg x 1 dose Monitor labs and s/s of bleeding  Ramond Craver 03/07/2019,10:27 AM

## 2019-03-07 NOTE — Progress Notes (Signed)
PROGRESS NOTE    Keith Young  WER:[id number] DOB: 26-Jul-1950 DOA: 03/02/2019 PCP: [name], MD   Brief Narrative:  68 year old male sent to ED after he was found unresponsive after hemodialysis at the [facility]. He was seen by the tele-neurologist who was concerned for stroke. He was not a candidate for TPA given that he was therapeutic on warfarin. MRI brain was recommended.  10/7: Brain MRI performed yesterday at [facility] with results still pending.  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 Holding warfarin for now.  Plans for further hemodialysis in a.m.  10/8: Brain MRI with no acute findings noted.  It is now suspected that patient may have had a syncopal episode due to severe LV dysfunction.  He will need close cardiology follow-up outpatient and this will be arranged prior to discharge.  He has significant abdominal distention which will need further evaluation with CT of the abdomen pelvis with results still pending.  Hemodialysis planned for today.  Assessment & Plan:   Principal Problem:   CVA (cerebral vascular accident) ([place]) Active Problems:   Essential hypertension, benign   CAD (coronary artery disease)   ICD (implantable cardioverter-defibrillator) in place   HIV disease ([place])   COPD (chronic obstructive pulmonary disease) (HCC)   PAF (paroxysmal atrial fibrillation) (HCC)   Type 2 diabetes mellitus with hypertension and end stage renal disease on dialysis ([place])   ESRD (end stage renal disease) on dialysis (HCC)   Chronic anticoagulation   NSVT (nonsustained ventricular tachycardia) (HCC)   At moderate risk for fall   Bell's palsy   1. Suspected syncope likely related to severe LV dysfunction-patient has a pronounced facial droop and has failed a swallow screen however he has a history of Bells' Palsy. MRI performed at [facility] on 10/6 with no acute findings.   Restart warfarin today.  Continue stroke work-up as ordered. Continue aspirin for 1 month.  EEG with no acute findings noted.Inpatient neurology consult appreciated. Continue neurochecks. Continue supportive therapy.  2D echocardiogram on 03/03/2019 with LVEF 15-20% and this likely may have caused a cardiogenic syncope during hemodialysis. 2. Hypoglycemia-likely from being n.p.o. currently resolved. 3. End-stage renal disease on hemodialysis TTS-Consultednephrology as he will require dialysis TTS.  Hemodialysis today on 10/8. 4. Ischemic cardiomyopathy-patient has an ICD in place in 2014. Also may need to have device interrogated to see if that may be the reason he was found unresponsive after hisoutpatientHD treatment. 5. PAF-he has been maintained on warfarin for his bioprosthetic AVR and for stroke prevention. His INR today is 2.5. Tele-neurology asked to hold warfarin until after the MRI is been completed. Likely can resume warfarin if no major bleeding. 6. Essential hypertension-stable.He does take midodrine on dialysis days due to soft BPs. 7. HIV-he has been maintained on HAART.  8. COPD - stable, resumed home bronchodilators.  9. Type 2 DM -blood sugars better today now that he is eating. 10. Acute urinary retention with failed catheter placement.  Appreciate urology for flex cystoscopy placement of catheter perfomed on 10/7.    Patient still has persistent abdominal distention for which CT of the abdomen pelvis pending.  DVT prophylaxis:warfarin restarted today Code Status:DNR  Family Communication:patient updated bedside Disposition Plan:inpatient; plan to DC back to [facility] after CT abdomen reviewed and noted to be stable.  Consultants:   Neurology  Urology  Procedures:   None  Antimicrobials:   None  Subjective: Patient seen  and evaluated today with no new acute complaints or concerns. No acute concerns or events noted overnight.  He is noted to  have distended abdomen today.  Objective: Vitals:   03/07/19 1345 03/07/19 1400 03/07/19 1430 03/07/19 1500  BP: (!) 89/66 102/69 96/68 (!) 89/68  Pulse: 83 73 82 85  Resp:      Temp:      TempSrc:      SpO2:      Weight:      Height:        Intake/Output Summary (Last 24 hours) at 03/07/2019 1507 Last data filed at 03/07/2019 1300 Gross per 24 hour  Intake 600 ml  Output -  Net 600 ml   Filed Weights   03/02/19 1538  Weight: 68 kg    Examination:  General exam: Appears calm and comfortable  Respiratory system: Clear to auscultation. Respiratory effort normal. Cardiovascular system: S1 & S2 heard, RRR. No JVD, murmurs, rubs, gallops or clicks. No pedal edema. Gastrointestinal system: Abdomen is distended, soft and nontender. No organomegaly or masses felt. Normal bowel sounds heard. Central nervous system: Alert and oriented. No focal neurological deficits. Extremities: Symmetric 5 x 5 power. Skin: No rashes, lesions or ulcers Psychiatry: Judgement and insight appear normal. Mood & affect appropriate.     Data Reviewed: I have personally reviewed following labs and imaging studies  CBC: Recent Labs  Lab 03/02/19 1534 03/04/19 0511 03/05/19 0506 03/06/19 0621 03/07/19 0714  WBC 2.4* 2.7* 2.8* 2.8* 2.3*  NEUTROABS 1.6*  --   --  2.0  --   HGB 12.4* 12.7* 12.6* 11.5* 12.1*  HCT 36.3* 38.2* 37.4* 34.5* 35.5*  MCV 92.6 95.0 93.5 93.8 93.9  PLT 79* 79* 81* 72* 73*   Basic Metabolic Panel: Recent Labs  Lab 03/03/19 0600 03/04/19 0511 03/05/19 0506 03/06/19 0621 03/07/19 0714  NA 138 138 138 135 135  K 3.4* 4.0 3.8 4.8 4.0  CL 99 100 101 96* 97*  CO2 26 24 26 25 27   GLUCOSE 65* 80 115* 191* 110*  BUN 24* 30* 37* 27* 36*  CREATININE 3.13* 3.96* 4.74* 3.73* 4.68*  CALCIUM 8.6* 8.8* 8.8* 8.6* 8.8*  MG  --  2.1  --   --   --   PHOS  --  3.9 4.1 3.1  --    GFR: Estimated Creatinine Clearance: 12.3 mL/min (A) (by C-G formula based on SCr of 4.68 mg/dL  (H)). Liver Function Tests: Recent Labs  Lab 03/02/19 1534 03/03/19 0600 03/05/19 0506 03/06/19 0621  AST 47* 42*  --   --   ALT 45* 40  --   --   ALKPHOS 161* 150*  --   --   BILITOT 1.8* 2.1*  --   --   PROT 7.0 6.6  --   --   ALBUMIN 2.9* 2.9* 2.8* 2.9*   No results for input(s): LIPASE, AMYLASE in the last 168 hours. No results for input(s): AMMONIA in the last 168 hours. Coagulation Profile: Recent Labs  Lab 03/03/19 0600 03/04/19 0732 03/05/19 0506 03/06/19 0621 03/07/19 0714  INR 2.1* 2.5* 2.8* 2.3* 1.8*   Cardiac Enzymes: No results for input(s): CKTOTAL, CKMB, CKMBINDEX, TROPONINI in the last 168 hours. BNP (last 3 results) No results for input(s): PROBNP in the last 8760 hours. HbA1C: No results for input(s): HGBA1C in the last 72 hours. CBG: Recent Labs  Lab 03/06/19 1108 03/06/19 1624 03/06/19 2018 03/07/19 0734 03/07/19 1112  GLUCAP 158* 116* 145* 101*  137*   Lipid Profile: No results for input(s): CHOL, HDL, LDLCALC, TRIG, CHOLHDL, LDLDIRECT in the last 72 hours. Thyroid Function Tests: No results for input(s): TSH, T4TOTAL, FREET4, T3FREE, THYROIDAB in the last 72 hours. Anemia Panel: No results for input(s): VITAMINB12, FOLATE, FERRITIN, TIBC, IRON, RETICCTPCT in the last 72 hours. Sepsis Labs: No results for input(s): PROCALCITON, LATICACIDVEN in the last 168 hours.  Recent Results (from the past 240 hour(s))  SARS CORONAVIRUS 2 (TAT 6-24 HRS) Nasopharyngeal Nasopharyngeal Swab     Status: None   Collection Time: 03/02/19  7:35 PM   Specimen: Nasopharyngeal Swab  Result Value Ref Range Status   SARS Coronavirus 2 NEGATIVE NEGATIVE Final    Comment: (NOTE) SARS-CoV-2 target nucleic acids are NOT DETECTED. The SARS-CoV-2 RNA is generally detectable in upper and lower respiratory specimens during the acute phase of infection. Negative results do not preclude SARS-CoV-2 infection, do not rule out co-infections with other pathogens, and  should not be used as the sole basis for treatment or other patient management decisions. Negative results must be combined with clinical observations, patient history, and epidemiological information. The expected result is Negative. Fact Sheet for Patients: SugarRoll.be Fact Sheet for Healthcare Providers: https://www.woods-mathews.com/ This test is not yet approved or cleared by the [place] FDA and  has been authorized for detection and/or diagnosis of SARS-CoV-2 by FDA under an Emergency Use Authorization (EUA). This EUA will remain  in effect (meaning this test can be used) for the duration of the COVID-19 declaration under Section 56 4(b)(1) of the Act, 21 U.S.C. section 360bbb-3(b)(1), unless the authorization is terminated or revoked sooner. Performed at [facility], [place] [address] [place], [place] [address]   MRSA PCR Screening     Status: None   Collection Time: 03/02/19 11:39 PM   Specimen: Nasopharyngeal  Result Value Ref Range Status   MRSA by PCR NEGATIVE NEGATIVE Final    Comment:        The GeneXpert MRSA Assay (FDA approved for NASAL specimens only), is one component of a comprehensive MRSA colonization surveillance program. It is not intended to diagnose MRSA infection nor to guide or monitor treatment for MRSA infections. Performed at [facility], [address], [place], [place] [address]          Radiology Studies: Ct Abdomen Pelvis Wo Contrast  Result Date: 03/07/2019 CLINICAL DATA:  Abdominal distension. Weakness. Hematuria. EXAM: CT ABDOMEN AND PELVIS WITHOUT CONTRAST TECHNIQUE: Multidetector CT imaging of the abdomen and pelvis was performed following the standard protocol without IV contrast. COMPARISON:  07/12/2018 FINDINGS: Lower chest: Post CABG changes. Stable enlarged heart and right ventricular AICD lead. Dense atheromatous calcifications, including extensive coronary artery  calcifications and aortic calcifications. Small to moderate-sized right pleural effusion and small left pleural effusion. Mild increase in prominence of the interstitial markings. Minimal bilateral dependent atelectasis. Hepatobiliary: The lateral segment of the left lobe of the liver and caudate lobe remain enlarged. The right lobe is relatively small. Poorly distended gallbladder. Pancreas: Grossly unremarkable pancreas, poorly visualized due to streak artifacts and adjacent free peritoneal fluid. Spleen: Normal in size without focal abnormality. Adrenals/Urinary Tract: Stable mid right renal cyst. Grossly normal left kidney. Poorly visualized adrenal glands due to the streak artifacts. Foley catheter balloon in the prostate gland with the tip in the inferior aspect of the urinary bladder. No visible urine in the bladder. No gross ureteral abnormality. Stomach/Bowel: Multiple descending and proximal sigmoid colon diverticula. Stomach distended with ingested material  and oral contrast. Poorly visualized small bowel with no gross small bowel abnormality. No evidence of appendicitis. Vascular/Lymphatic: Atheromatous arterial calcifications without aneurysm. No enlarged lymph nodes. Reproductive: Foley catheter and balloon in the prostate gland. Other: Interval large amount of free peritoneal fluid in the abdomen and pelvis. Interval diffuse subcutaneous edema. Musculoskeletal: Bilateral L5 pars interarticularis defects with minimal anterolisthesis at the L5-S1 level. 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 IMPRESSION: 1. Interval large amount of free peritoneal fluid in the abdomen and pelvis. 2. Interval diffuse subcutaneous edema. 3. Small to moderate-sized right pleural effusion and small left pleural effusion. 4. Mild increase in prominence of the interstitial markings, compatible with interstitial pulmonary edema. 5. Stable possible early changes of cirrhosis of the liver.  6. Colonic diverticulosis. 7. Bilateral L5 spondylolysis with minimal spondylolisthesis at the L5-S1 level. 8. 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 9. Stable cardiomegaly and dense calcific coronary artery and aortic atherosclerosis. Electronically Signed   By: Claudie Revering M.D.   On: 03/07/2019 15:04   Mr Brain Wo Contrast  Result Date: 03/06/2019 CLINICAL DATA:  Altered level of consciousness. EXAM: MRI HEAD WITHOUT CONTRAST TECHNIQUE: Multiplanar, multiecho pulse sequences of the brain and surrounding structures were obtained without intravenous contrast. COMPARISON:  CT head 03/02/2019 FINDINGS: Brain: Moderate to advanced atrophy. Ventricular enlargement consistent with atrophy. Negative for acute infarct. Chronic microvascular ischemic changes in the white matter. Chronic lacunar infarctions in the pons right greater than left. Chronic infarcts cerebellum bilaterally. Chronic infarct right posterior temporal lobe. Negative for hemorrhage or mass Vascular: Normal arterial flow voids Skull and upper cervical spine: Negative Sinuses/Orbits: Mucosal edema paranasal sinuses.  Negative orbit Other: None IMPRESSION: No acute infarct Atrophy with extensive chronic ischemic changes. Electronically Signed   By: Franchot Gallo M.D.   On: 03/06/2019 14:53        Scheduled Meds: . atorvastatin  80 mg Oral Daily  . bictegravir-emtricitabine-tenofovir AF  1 tablet Oral Daily  . chlorhexidine  15 mL Mouth Rinse BID  . Chlorhexidine Gluconate Cloth  6 each Topical Q0600  . cholecalciferol  2,000 Units Oral Daily  . docusate sodium  100 mg Oral Daily  . doxercalciferol  2 mcg Intravenous Q T,Th,Sa-HD  . fluticasone furoate-vilanterol  1 puff Inhalation Daily  . mouth rinse  15 mL Mouth Rinse q12n4p  . metoprolol succinate  25 mg Oral Daily  . midodrine  10 mg Oral Q T,Th,Sa-HD  . multivitamin with minerals  1 tablet Oral Daily  . pantoprazole  40 mg Oral Daily   . tamsulosin  0.4 mg Oral Daily  . warfarin  3.5 mg Oral Once  . Warfarin - Pharmacist Dosing Inpatient   Does not apply q1800   Continuous Infusions: . sodium chloride    . sodium chloride    . albumin human       LOS: 5 days    Time spent: 30 minutes    Cierra Rothgeb Darleen Crocker, DO [facility] Pager [phone]  If 7PM-7AM, please contact night-coverage [email or web address] Password [email or web address] 03/07/2019, 3:07 PM

## 2019-03-07 NOTE — Progress Notes (Signed)
Admit: 03/02/2019 LOS: 5  74M ESRD with AMS  Subjective:  . No new events, did require Foley catheter with urology . MRI negative for acute CVA, chronic ischemic disease and cerebral atrophy noted . Patient ate some breakfast, no complaints this morning . Oriented to self and hospital only  10/07 0701 - 10/08 0700 In: 120 [P.O.:120] Out: -   Filed Weights   03/02/19 1538  Weight: 68 kg    Scheduled Meds: . atorvastatin  80 mg Oral Daily  . bictegravir-emtricitabine-tenofovir AF  1 tablet Oral Daily  . chlorhexidine  15 mL Mouth Rinse BID  . Chlorhexidine Gluconate Cloth  6 each Topical Q0600  . cholecalciferol  2,000 Units Oral Daily  . docusate sodium  100 mg Oral Daily  . doxercalciferol  2 mcg Intravenous Q T,Th,Sa-HD  . fluticasone furoate-vilanterol  1 puff Inhalation Daily  . mouth rinse  15 mL Mouth Rinse q12n4p  . metoprolol succinate  25 mg Oral Daily  . midodrine  10 mg Oral Q T,Th,Sa-HD  . multivitamin with minerals  1 tablet Oral Daily  . pantoprazole  40 mg Oral Daily  . tamsulosin  0.4 mg Oral Daily   Continuous Infusions: . sodium chloride    . sodium chloride     PRN Meds:.sodium chloride, sodium chloride, acetaminophen, albuterol, lidocaine (PF), lidocaine-prilocaine, pentafluoroprop-tetrafluoroeth, senna-docusate  Current Labs: reviewed    Physical Exam:  Blood pressure 107/72, pulse 87, temperature 97.8 F (36.6 C), resp. rate 17, height 5\' 3"  (1.6 m), weight 68 kg, SpO2 96 %. Chronically ill-appearing, NAD Regular, normal S1-S2 Coarse breath sounds bilaterally, diminished in the bases + B/T in left arm AVG S/nt/nd   Dialyzes at [facility]-TTS-4 hours EDW 66.5. HD Bath 2K/2.5 calcium, Dialyzer unknown, Heparin unknown. Access left AV graft-15-gauge, 400/600.  Hectorol 2 mics with treatment  A 1. ESRD TTS [facility] on schedule, LUE AVG 2. AMS , MRI negative for acute CVA, TRH and Neuro following 3. BP, on  midodrine 4. ANemia, Hb stable 5. HIV on ARV 6. CKD BMD, stable  P . Cont HD on schedule: 2K 1-2L UF, AVG 400/600,  . Medication Issues; o Preferred narcotic agents for pain control are hydromorphone, fentanyl, and methadone. Morphine should not be used.  o Baclofen should be avoided o Avoid oral sodium phosphate and magnesium citrate based laxatives / bowel preps    Pearson Grippe MD 03/07/2019, 9:42 AM  Recent Labs  Lab 03/04/19 0511 03/05/19 0506 03/06/19 0621 03/07/19 0714  NA 138 138 135 135  K 4.0 3.8 4.8 4.0  CL 100 101 96* 97*  CO2 24 26 25 27   GLUCOSE 80 115* 191* 110*  BUN 30* 37* 27* 36*  CREATININE 3.96* 4.74* 3.73* 4.68*  CALCIUM 8.8* 8.8* 8.6* 8.8*  PHOS 3.9 4.1 3.1  --    Recent Labs  Lab 03/02/19 1534  03/05/19 0506 03/06/19 0621 03/07/19 0714  WBC 2.4*   < > 2.8* 2.8* 2.3*  NEUTROABS 1.6*  --   --  2.0  --   HGB 12.4*   < > 12.6* 11.5* 12.1*  HCT 36.3*   < > 37.4* 34.5* 35.5*  MCV 92.6   < > 93.5 93.8 93.9  PLT 79*   < > 81* 72* 73*   < > = values in this interval not displayed.

## 2019-03-07 NOTE — Procedures (Signed)
    HEMODIALYSIS TREATMENT NOTE:  4 hour heparin-free dialysis completed via left forearm loop AVG. Goal met: 1 liter removed with administration of MIdodrine and Albumin.  All blood was returned and hemostasis was achieved in 25 minutes.  Rockwell Alexandria, RN

## 2019-03-08 ENCOUNTER — Inpatient Hospital Stay (HOSPITAL_COMMUNITY): Payer: Medicare HMO

## 2019-03-08 ENCOUNTER — Inpatient Hospital Stay
Admission: RE | Admit: 2019-03-08 | Discharge: 2019-04-30 | Disposition: E | Payer: Medicare HMO | Source: Ambulatory Visit | Attending: Internal Medicine | Admitting: Internal Medicine

## 2019-03-08 LAB — CBC
HCT: 32.9 % — ABNORMAL LOW (ref 39.0–52.0)
Hemoglobin: 11.5 g/dL — ABNORMAL LOW (ref 13.0–17.0)
MCH: 31.8 pg (ref 26.0–34.0)
MCHC: 35 g/dL (ref 30.0–36.0)
MCV: 90.9 fL (ref 80.0–100.0)
Platelets: 68 10*3/uL — ABNORMAL LOW (ref 150–400)
RBC: 3.62 MIL/uL — ABNORMAL LOW (ref 4.22–5.81)
RDW: 19.7 % — ABNORMAL HIGH (ref 11.5–15.5)
WBC: 2.7 10*3/uL — ABNORMAL LOW (ref 4.0–10.5)
nRBC: 0 % (ref 0.0–0.2)

## 2019-03-08 LAB — GLUCOSE, CAPILLARY
Glucose-Capillary: 105 mg/dL — ABNORMAL HIGH (ref 70–99)
Glucose-Capillary: 134 mg/dL — ABNORMAL HIGH (ref 70–99)
Glucose-Capillary: 117 mg/dL — ABNORMAL HIGH (ref 70–99)

## 2019-03-08 LAB — BASIC METABOLIC PANEL
Anion gap: 13 (ref 5–15)
BUN: 23 mg/dL (ref 8–23)
CO2: 25 mmol/L (ref 22–32)
Calcium: 8.8 mg/dL — ABNORMAL LOW (ref 8.9–10.3)
Chloride: 96 mmol/L — ABNORMAL LOW (ref 98–111)
Creatinine, Ser: 3.41 mg/dL — ABNORMAL HIGH (ref 0.61–1.24)
GFR calc Af Amer: 20 mL/min — ABNORMAL LOW (ref >60)
GFR calc non Af Amer: 18 mL/min — ABNORMAL LOW (ref >60)
Glucose, Bld: 120 mg/dL — ABNORMAL HIGH (ref 70–99)
Potassium: 3.4 mmol/L — ABNORMAL LOW (ref 3.5–5.1)
Sodium: 134 mmol/L — ABNORMAL LOW (ref 135–145)

## 2019-03-08 LAB — LACTATE DEHYDROGENASE, PLEURAL OR PERITONEAL FLUID: LD, Fluid: 143 U/L — ABNORMAL HIGH (ref 3–23)

## 2019-03-08 LAB — PROTEIN, PLEURAL OR PERITONEAL FLUID: Total protein, fluid: 4.2 g/dL

## 2019-03-08 LAB — GRAM STAIN: Gram Stain: NONE SEEN

## 2019-03-08 LAB — ALBUMIN, PLEURAL OR PERITONEAL FLUID: Albumin, Fluid: 2.2 g/dL

## 2019-03-08 LAB — PROTIME-INR
INR: 1.8 — ABNORMAL HIGH (ref 0.8–1.2)
Prothrombin Time: 20.7 seconds — ABNORMAL HIGH (ref 11.4–15.2)

## 2019-03-08 MED ORDER — WARFARIN SODIUM 5 MG PO TABS: 5.0000 mg | ORAL_TABLET | Freq: Once | ORAL | Status: DC

## 2019-03-08 NOTE — Progress Notes (Signed)
Ultra Sounded guided Paracentesis complete, patient tolerated well , vitals stable. 3.9 L removed.

## 2019-03-08 NOTE — Progress Notes (Signed)
Physical Therapy Treatment Patient Details Name: Keith Young MRN: [id number] DOB: May 17, 1951 Today's Date: 03/15/2019    History of Present Illness 68 year old male sent to ED after he was found unresponsive after hemodialysis at the [facility].  He was seen by the tele-neurologist who was concerned for stroke.  He was not a candidate for TPA given that he was therapeutic on warfarin.  MRI brain was recommended. Patient admitted from [facility].    PT Comments    Patient has difficulty for sitting up at bedside due to weakness, requires frequent verbal/tactile cueing to complete BLE ROM/strengthening exercises, limited to a few side steps at bedside with slow labored movement and tactile assistance to move feet due to weakness.  Patient tolerated sitting up in chair after therapy.  Plan: patient to be discharged to SNF today and discharged to care of nursing for OOB as tolerated for length of stay.     Follow Up Recommendations  SNF     Equipment Recommendations  None recommended by PT    Recommendations for Other Services       Precautions / Restrictions Precautions Precautions: Fall Restrictions Weight Bearing Restrictions: No    Mobility  Bed Mobility Overal bed mobility: Needs Assistance Bed Mobility: Supine to Sit Rolling: Mod assist         General bed mobility comments: increased time, labored movement  Transfers Overall transfer level: Needs assistance Equipment used: Rolling walker (2 wheeled) Transfers: Sit to/from [facility] Sit to Stand: Mod assist Stand pivot transfers: Mod assist;Max assist       General transfer comment: limited due to BLE weakness  Ambulation/Gait Ambulation/Gait assistance: Max assist;Mod assist Gait Distance (Feet): 4 Feet Assistive device: Rolling walker (2 wheeled) Gait Pattern/deviations: Decreased step length - right;Decreased step length - left;Decreased stride length Gait velocity: slow   General  Gait Details: limited to 4-5 slow labored side steps requiring tactile assistance to move BLE due to weakness   Stairs             Wheelchair Mobility    Modified Rankin (Stroke Patients Only)       Balance Overall balance assessment: Needs assistance Sitting-balance support: Feet supported;Bilateral upper extremity supported Sitting balance-Leahy Scale: Fair Sitting balance - Comments: fair/poor without BUE support seated at bedside   Standing balance support: During functional activity;Bilateral upper extremity supported Standing balance-Leahy Scale: Poor Standing balance comment: fair/poor with RW                            Cognition Arousal/Alertness: Awake/alert Behavior During Therapy: WFL for tasks assessed/performed Overall Cognitive Status: Within Functional Limits for tasks assessed                                        Exercises General Exercises - Lower Extremity Long Arc Quad: Seated;AROM;Strengthening;Both;10 reps Hip Flexion/Marching: Seated;AROM;Strengthening;Both;10 reps Toe Raises: Seated;AROM;Strengthening;Both;10 reps Heel Raises: Seated;AROM;Strengthening;Both;10 reps    General Comments        Pertinent Vitals/Pain Pain Assessment: No/denies pain    Home Living                      Prior Function            PT Goals (current goals can now be found in the care plan section) Acute Rehab PT Goals Patient  Stated Goal: Go back to [facility] PT Goal Formulation: With patient Time For Goal Achievement: 03/17/19 Potential to Achieve Goals: Good Progress towards PT goals: Progressing toward goals    Frequency    Min 2X/week      PT Plan Current plan remains appropriate    Co-evaluation              AM-PAC PT "6 Clicks" Mobility   Outcome Measure  Help needed turning from your back to your side while in a flat bed without using bedrails?: A Lot Help needed moving from lying on your  back to sitting on the side of a flat bed without using bedrails?: A Lot Help needed moving to and from a bed to a chair (including a wheelchair)?: A Lot Help needed standing up from a chair using your arms (e.g., wheelchair or bedside chair)?: A Lot Help needed to walk in hospital room?: A Lot Help needed climbing 3-5 steps with a railing? : Total 6 Click Score: 11    End of Session   Activity Tolerance: Patient tolerated treatment well;Patient limited by fatigue Patient left: with call bell/phone within reach;in chair;with chair alarm set Nurse Communication: Mobility status PT Visit Diagnosis: Unsteadiness on feet (R26.81);Other abnormalities of gait and mobility (R26.89);Muscle weakness (generalized) (M62.81)     Time: 3559-7416 PT Time Calculation (min) (ACUTE ONLY): 30 min  Charges:  $Therapeutic Exercise: 8-22 mins $Therapeutic Activity: 8-22 mins                     2:06 PM, 03/07/2019 Lonell Grandchild, MPT Physical Therapist with [facility] [phone] office [phone] mobile phone

## 2019-03-08 NOTE — Progress Notes (Addendum)
ANTICOAGULATION CONSULT NOTE - Pharmacy Consult for warfarin Indication: atrial fibrillation/AVR  No Known Allergies  Patient Measurements: Height: 5\' 3"  (160 cm) Weight: (unable to determine d/t safety padding) IBW/kg (Calculated) : 56.9   Vital Signs: Temp: 98 F (36.7 C) (10/09 0522) Temp Source: Oral (10/09 0522) BP: 93/74 (10/09 0522) Pulse Rate: 93 (10/09 0522)  Labs: Recent Labs    03/06/19 0621 03/07/19 0714 03/05/2019 0558  HGB 11.5* 12.1* 11.5*  HCT 34.5* 35.5* 32.9*  PLT 72* 73* 68*  LABPROT 25.1* 21.0* 20.7*  INR 2.3* 1.8* 1.8*  CREATININE 3.73* 4.68* 3.41*    Estimated Creatinine Clearance: 16.9 mL/min (A) (by C-G formula based on SCr of 3.41 mg/dL (H)).    Assessment: Pharmacy consulted to dose warfarin for patient with atrial fibrillation and bioprosthetic AVR.  Patient's warfarin has been held pending MRI and restarted 10/8 Home dose listed as 3.5 mg daily  INR : 1.8  Goal of Therapy:  INR goal 2.5-3.0 per note on 9/1 Monitor platelets by anticoagulation protocol: Yes   Plan:  Warfarin 5 mg x 1 dose- booster dose Monitor labs and s/s of bleeding  Revonda Standard Kennie Snedden 03/25/2019,10:42 AM

## 2019-03-08 NOTE — Discharge Summary (Addendum)
Physician Discharge Summary  Keith Young OHY:[id number] DOB: Dec 23, 1950 DOA: 03/02/2019  PCP: Keith Duos, MD  Admit date: 03/02/2019  Discharge date: 03/22/2019  Admitted From:SNF  Disposition:  [name]  Recommendations for Outpatient Follow-up:  1. Follow up with PCP in 1-2 weeks 2. Continue hemodialysis per usual schedule TTS.  Last hemodialysis session 03/07/2019 3. Continue on Coumadin and monitor INR 4. Will avoid prescription of aspirin given thrombocytopenia and high risk for bleeding while on Coumadin 5. Please consider hospice/palliative evaluation as patient appears to be nearing end-of-life and appears to have poor long and short-term prognosis 6. Please follow-up peritoneal fluid studies after paracentesis is performed on day of discharge 03/12/2019, although this was more done from a comfort standpoint.  3.9 L removed.  Home Health: None  Equipment/Devices: None  Discharge Condition: Stable  CODE STATUS: DNR  Diet recommendation: Dysphagia 2 diet  Brief/Interim Summary: 68 year old male sent to ED after he was found unresponsive after hemodialysis at the [name].  He has history of severe LV dysfunction with LVEF 15-20%, end-stage renal disease on HD TTS, ischemic cardiomyopathy with ICD, paroxysmal atrial fibrillation on warfarin due to bioprosthetic aortic valve replacement, essential hypertension, HIV, COPD, and type 2 diabetes. He was seen by the tele-neurologist who was concerned for stroke. He was not a candidate for TPA given that he was therapeutic on warfarin. MRI brain was recommended.  10/7:Brain MRI performed yesterday at [name] with results still pending. 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 Holding warfarin for now. Plans for further hemodialysis in a.m.  10/8: Brain MRI with no acute findings noted.  It is now suspected that patient may have  had a syncopal episode due to severe LV dysfunction.  He will need close cardiology follow-up outpatient and this will be arranged prior to discharge.  He has significant abdominal distention which will need further evaluation with CT of the abdomen pelvis with results still pending.  Hemodialysis planned for today.  10/9: CT of the abdomen pelvis demonstrates significant amounts of fluid in the abdomen and patient has undergone therapeutic and diagnostic paracentesis.  He will need follow-up and fluid studies which are still currently pending, but he is stable for discharge.  It appears that much of his fluid is present on account of severe LV dysfunction with EF 10-15% as well as ESRD.  Unfortunately, it does not appear that patient can tolerate full session of hemodialysis on account of hypotension and therefore, retains fluid.  No findings of CVA noted and it appears that patient may have had a syncopal episode on account of hypotension due to severe LV dysfunction.  He is nearing end-of-life and would likely benefit from hospice/palliative consultation soon and placement on comfort measures/hospice.  He has had Foley catheter placed by urology on 10/7 and this will be removed today.  This was placed on account of bladder scan demonstrating high amounts of urinary retention, but I believe it was picking up his abdominal ascites instead.  He needs careful monitoring of his INR with ongoing use of Coumadin.  Discharge Diagnoses:  Principal Problem:   CVA (cerebral vascular accident) [name]) Active Problems:   Essential hypertension, benign   CAD (coronary artery disease)   ICD (implantable cardioverter-defibrillator) in place   HIV disease ([place])   COPD (chronic obstructive pulmonary disease) (HCC)   PAF (paroxysmal atrial fibrillation) ([place])   Type 2 diabetes mellitus with hypertension and end  stage renal disease on dialysis ([place])   ESRD (end stage renal disease) on dialysis (HCC)   Chronic  anticoagulation   NSVT (nonsustained ventricular tachycardia) (HCC)   At moderate risk for fall   Bell's palsy  Principal discharge diagnosis: Suspected syncopal episode related to severe LV dysfunction during hemodialysis.  Discharge Instructions  Discharge Instructions    Diet - low sodium heart healthy   Complete by: As directed    Increase activity slowly   Complete by: As directed      Allergies as of 03/26/2019   No Known Allergies     Medication List    TAKE these medications   acetaminophen 325 MG tablet Commonly known as: TYLENOL Take 2 tablets (650 mg total) by mouth every 4 (four) hours as needed for mild pain (or temp > 37.5 C (99.5 F)).   albuterol (2.5 MG/3ML) 0.083% nebulizer solution Commonly known as: PROVENTIL Take 3 mLs (2.5 mg total) by nebulization every 4 (four) hours as needed for wheezing or shortness of breath.   atorvastatin 80 MG tablet Commonly known as: LIPITOR Take 80 mg by mouth daily.   bictegravir-emtricitabine-tenofovir AF 50-200-25 MG Tabs tablet Commonly known as: BIKTARVY Take 1 tablet by mouth daily.   calcium carbonate 750 MG chewable tablet Commonly known as: TUMS EX Chew 1 tablet by mouth 2 (two) times daily.   coal tar 0.5 % shampoo Commonly known as: NEUTROGENA T-GEL Apply to scalp on shower days (Twice a week) for dandruff and dry scalp Wed., and Sat.   docusate sodium 100 MG capsule Commonly known as: COLACE Take 100 mg by mouth daily.   feeding supplement (NEPRO CARB STEADY) Liqd Take by mouth daily.   Fluticasone-Salmeterol 100-50 MCG/DOSE Aepb Commonly known as: ADVAIR Inhale 1 puff into the lungs daily.   metoprolol succinate 25 MG 24 hr tablet Commonly known as: TOPROL-XL Take 1 tablet (25 mg total) by mouth daily.   midodrine 10 MG tablet Commonly known as: PROAMATINE Take 10 mg by mouth. Send medication with resident to be given at center. Once a day on Tues.,Thurs., Sat.   multivitamin Tabs  tablet Take 1 tablet by mouth at bedtime.   NON FORMULARY Diet Type:  Dysphagia 2 with thin liquids (NO Straws)   omeprazole 20 MG capsule Commonly known as: PRILOSEC Take 20 mg by mouth at bedtime.   Senexon-S 8.6-50 MG tablet Generic drug: senna-docusate Take 1 tablet by mouth daily.   tamsulosin 0.4 MG Caps capsule Commonly known as: FLOMAX Take 0.4 mg by mouth daily.   UNABLE TO FIND FLUID RESTRICTION 1200 CC/ 24hrs. Breakfast (12oz) 360 ml lunch (8oz) 240 ml Dinner (12oz) 360 ml Every Shift Day, Evening, Night   Vitamin D3 50 MCG (2000 UT) Tabs Take 2,000 mg by mouth daily.   warfarin 1 MG tablet Commonly known as: COUMADIN Take 1 mg by mouth daily. Give along with 2.5 mg tab for total of 3.5 daily   warfarin 2.5 MG tablet Commonly known as: COUMADIN Take 2.5 mg by mouth daily. Give along with 1 mg tab for total of 3.5 mg daily   ZINC OXIDE EX Apply Zinc oxide to bilateral buttocks area for irritation. Every Shift Day, Day, [place], [place]. Go to.   Specialty: Skilled Nursing Facility Contact information: [place] [address] [phone]        Keith Duos, MD Follow up in 1 week(s).  Specialty: Internal Medicine Contact information: [place] [address] [phone]          No Known Allergies  Consultations:  Nephrology  Neurology  Urology   Procedures/Studies: Ct Abdomen Pelvis Wo Contrast  Result Date: 03/07/2019 CLINICAL DATA:  Abdominal distension. Weakness. Hematuria. EXAM: CT ABDOMEN AND PELVIS WITHOUT CONTRAST TECHNIQUE: Multidetector CT imaging of the abdomen and pelvis was performed following the standard protocol without IV contrast. COMPARISON:  07/12/2018 FINDINGS: Lower chest: Post CABG changes. Stable enlarged heart and right ventricular AICD lead. Dense atheromatous calcifications, including extensive coronary artery calcifications and  aortic calcifications. Small to moderate-sized right pleural effusion and small left pleural effusion. Mild increase in prominence of the interstitial markings. Minimal bilateral dependent atelectasis. Hepatobiliary: The lateral segment of the left lobe of the liver and caudate lobe remain enlarged. The right lobe is relatively small. Poorly distended gallbladder. Pancreas: Grossly unremarkable pancreas, poorly visualized due to streak artifacts and adjacent free peritoneal fluid. Spleen: Normal in size without focal abnormality. Adrenals/Urinary Tract: Stable mid right renal cyst. Grossly normal left kidney. Poorly visualized adrenal glands due to the streak artifacts. Foley catheter balloon in the prostate gland with the tip in the inferior aspect of the urinary bladder. No visible urine in the bladder. No gross ureteral abnormality. Stomach/Bowel: Multiple descending and proximal sigmoid colon diverticula. Stomach distended with ingested material and oral contrast. Poorly visualized small bowel with no gross small bowel abnormality. No evidence of appendicitis. Vascular/Lymphatic: Atheromatous arterial calcifications without aneurysm. No enlarged lymph nodes. Reproductive: Foley catheter and balloon in the prostate gland. Other: Interval large amount of free peritoneal fluid in the abdomen and pelvis. Interval diffuse subcutaneous edema. Musculoskeletal: Bilateral L5 pars interarticularis defects with minimal anterolisthesis at the L5-S1 level. 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 IMPRESSION: 1. Interval large amount of free peritoneal fluid in the abdomen and pelvis. 2. Interval diffuse subcutaneous edema. 3. Small to moderate-sized right pleural effusion and small left pleural effusion. 4. Mild increase in prominence of the interstitial markings, compatible with interstitial pulmonary edema. 5. Stable possible early changes of cirrhosis of the liver. 6. Colonic  diverticulosis. 7. Bilateral L5 spondylolysis with minimal spondylolisthesis at the L5-S1 level. 8. 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 9. Stable cardiomegaly and dense calcific coronary artery and aortic atherosclerosis. Electronically Signed   By: Claudie Revering M.D.   On: 03/07/2019 15:04   Ct Angio Head W Or Wo Contrast  Result Date: 03/02/2019 CLINICAL DATA:  Focal neuro deficit, less than 6 hours, stroke suspected. Additional history provided: Patient found unresponsive in wheelchair after returning from dialysis. Left-sided facial droop and slurred speech. Left grip weakness. EXAM: CT ANGIOGRAPHY HEAD AND NECK TECHNIQUE: Multidetector CT imaging of the head and neck was performed using the standard protocol during bolus administration of intravenous contrast. Multiplanar CT image reconstructions and MIPs were obtained to evaluate the vascular anatomy. Carotid stenosis measurements (when applicable) are obtained utilizing NASCET criteria, using the distal internal carotid diameter as the denominator. CONTRAST:  22mL OMNIPAQUE IOHEXOL 350 MG/ML SOLN COMPARISON:  None. Noncontrast head CT 03/02/2019, carotid artery duplex 06/27/2016 FINDINGS: CTA NECK FINDINGS Aortic arch: Standard branching. Imaged portions of the aortic arch demonstrate no evidence of dissection or aneurysm. Atherosclerotic calcification within the visualized aortic arch and proximal major branch vessels of the neck. Right carotid system: CCA and ICA patent within the neck without significant stenosis (50%  or greater). Atherosclerosis at the carotid bifurcation and within the proximal right ICA with less than 50% luminal narrowing of the proximal ICA. Distal to this, the ICA is patent within the neck without significant stenosis. Left carotid system: Scattered plaque within the common carotid artery without significant stenosis (50% or greater). Prominent plaque at the carotid bifurcation and  within the proximal ICA. Approximately 60-70% stenosis within the proximal ICA. Distal to this, the ICAs patent within the neck without significant stenosis. Vertebral arteries: The right vertebral artery is patent within the neck without significant stenosis (50% or greater. Plaque at the origin of the dominant left vertebral artery results in mild-to-moderate ostial stenosis. Distal to this, the left vertebral artery is patent within the neck without significant stenosis. Skeleton: Reversal of the expected cervical lordosis. Cervical spondylosis greatest at C6-C7. Diffusely heterogeneous appearance of the osseous structures likely reflecting renal osteodystrophy. Other neck: Generalized edema consistent with anasarca. Upper chest: Partially imaged bilateral pleural effusions. The right pleural effusion may be loculated. Sequela of prior median sternotomy. Partially visualized pacer leads. Review of the MIP images confirms the above findings CTA HEAD FINDINGS Anterior circulation: Atherosclerotic calcification within the intracranial internal carotid arteries bilaterally with regions of mild luminal stenosis. The right middle and anterior cerebral arteries are patent without significant proximal stenosis. The left middle and anterior cerebral arteries are patent without significant proximal stenosis. Posterior circulation: The intracranial vertebral arteries are patent without significant stenosis. The basilar artery is patent without significant proximal stenosis. The right posterior cerebral artery is patent. Mild focal stenosis within the right PCA at the P1-P2 junction. Moderate focal stenosis within the P2 left PCA. Venous sinuses: Within limitations of contrast timing, no convincing thrombus. Anatomic variants: None significant Review of the MIP images confirms the above findings These results were called by telephone at the time of interpretation on 03/02/2019 at 5:19 pm to provider [facility] , who  verbally acknowledged these results. IMPRESSION: CTA head: 1. No intracranial large vessel occlusion. 2. Moderate focal stenosis within the P2 left PCA. 3. Mild focal stenosis within the right PCA at the P1-P2 junction. 4. Additional intracranial atherosclerotic disease without significant stenosis as described. CTA neck: 1. Atherosclerotic disease within the imaged aortic arch and major branch vessels of the neck as detailed and most notably as follows. 2. Approximately 60-70% stenosis of the proximal left ICA. 3. Less than 50% stenosis of the proximal right ICA. 4. Mild/moderate ostial stenosis of the dominant left vertebral artery. The bilateral vertebral arteries are otherwise patent within the neck without significant stenosis. 5. Anasarca. Partially visualized bilateral pleural effusions. The right pleural effusion may be loculated. 6. Sequela of renal osteodystrophy. Electronically Signed   By: Kellie Simmering   On: 03/02/2019 17:19   Ct Angio Neck W And/or Wo Contrast  Result Date: 03/02/2019 CLINICAL DATA:  Focal neuro deficit, less than 6 hours, stroke suspected. Additional history provided: Patient found unresponsive in wheelchair after returning from dialysis. Left-sided facial droop and slurred speech. Left grip weakness. EXAM: CT ANGIOGRAPHY HEAD AND NECK TECHNIQUE: Multidetector CT imaging of the head and neck was performed using the standard protocol during bolus administration of intravenous contrast. Multiplanar CT image reconstructions and MIPs were obtained to evaluate the vascular anatomy. Carotid stenosis measurements (when applicable) are obtained utilizing NASCET criteria, using the distal internal carotid diameter as the denominator. CONTRAST:  86mL OMNIPAQUE IOHEXOL 350 MG/ML SOLN COMPARISON:  None. Noncontrast head CT 03/02/2019, carotid artery duplex 06/27/2016 FINDINGS: CTA NECK FINDINGS  Aortic arch: Standard branching. Imaged portions of the aortic arch demonstrate no evidence of  dissection or aneurysm. Atherosclerotic calcification within the visualized aortic arch and proximal major branch vessels of the neck. Right carotid system: CCA and ICA patent within the neck without significant stenosis (50% or greater). Atherosclerosis at the carotid bifurcation and within the proximal right ICA with less than 50% luminal narrowing of the proximal ICA. Distal to this, the ICA is patent within the neck without significant stenosis. Left carotid system: Scattered plaque within the common carotid artery without significant stenosis (50% or greater). Prominent plaque at the carotid bifurcation and within the proximal ICA. Approximately 60-70% stenosis within the proximal ICA. Distal to this, the ICAs patent within the neck without significant stenosis. Vertebral arteries: The right vertebral artery is patent within the neck without significant stenosis (50% or greater. Plaque at the origin of the dominant left vertebral artery results in mild-to-moderate ostial stenosis. Distal to this, the left vertebral artery is patent within the neck without significant stenosis. Skeleton: Reversal of the expected cervical lordosis. Cervical spondylosis greatest at C6-C7. Diffusely heterogeneous appearance of the osseous structures likely reflecting renal osteodystrophy. Other neck: Generalized edema consistent with anasarca. Upper chest: Partially imaged bilateral pleural effusions. The right pleural effusion may be loculated. Sequela of prior median sternotomy. Partially visualized pacer leads. Review of the MIP images confirms the above findings CTA HEAD FINDINGS Anterior circulation: Atherosclerotic calcification within the intracranial internal carotid arteries bilaterally with regions of mild luminal stenosis. The right middle and anterior cerebral arteries are patent without significant proximal stenosis. The left middle and anterior cerebral arteries are patent without significant proximal stenosis.  Posterior circulation: The intracranial vertebral arteries are patent without significant stenosis. The basilar artery is patent without significant proximal stenosis. The right posterior cerebral artery is patent. Mild focal stenosis within the right PCA at the P1-P2 junction. Moderate focal stenosis within the P2 left PCA. Venous sinuses: Within limitations of contrast timing, no convincing thrombus. Anatomic variants: None significant Review of the MIP images confirms the above findings These results were called by telephone at the time of interpretation on 03/02/2019 at 5:19 pm to provider [facility] , who verbally acknowledged these results. IMPRESSION: CTA head: 1. No intracranial large vessel occlusion. 2. Moderate focal stenosis within the P2 left PCA. 3. Mild focal stenosis within the right PCA at the P1-P2 junction. 4. Additional intracranial atherosclerotic disease without significant stenosis as described. CTA neck: 1. Atherosclerotic disease within the imaged aortic arch and major branch vessels of the neck as detailed and most notably as follows. 2. Approximately 60-70% stenosis of the proximal left ICA. 3. Less than 50% stenosis of the proximal right ICA. 4. Mild/moderate ostial stenosis of the dominant left vertebral artery. The bilateral vertebral arteries are otherwise patent within the neck without significant stenosis. 5. Anasarca. Partially visualized bilateral pleural effusions. The right pleural effusion may be loculated. 6. Sequela of renal osteodystrophy. Electronically Signed   By: Kellie Simmering   On: 03/02/2019 17:19   Mr Brain Wo Contrast  Result Date: 03/06/2019 CLINICAL DATA:  Altered level of consciousness. EXAM: MRI HEAD WITHOUT CONTRAST TECHNIQUE: Multiplanar, multiecho pulse sequences of the brain and surrounding structures were obtained without intravenous contrast. COMPARISON:  CT head 03/02/2019 FINDINGS: Brain: Moderate to advanced atrophy. Ventricular enlargement  consistent with atrophy. Negative for acute infarct. Chronic microvascular ischemic changes in the white matter. Chronic lacunar infarctions in the pons right greater than left. Chronic infarcts cerebellum bilaterally. Chronic  infarct right posterior temporal lobe. Negative for hemorrhage or mass Vascular: Normal arterial flow voids Skull and upper cervical spine: Negative Sinuses/Orbits: Mucosal edema paranasal sinuses.  Negative orbit Other: None IMPRESSION: No acute infarct Atrophy with extensive chronic ischemic changes. Electronically Signed   By: Franchot Gallo M.D.   On: 03/06/2019 14:53   Ct Head Code Stroke Wo Contrast  Result Date: 03/02/2019 CLINICAL DATA:  Code stroke. 68 year old male found unresponsive after dialysis. Left side weakness and slurred speech. EXAM: CT HEAD WITHOUT CONTRAST TECHNIQUE: Contiguous axial images were obtained from the base of the skull through the vertex without intravenous contrast. COMPARISON:  Head CT 07/11/2018 and earlier. FINDINGS: Brain: Chronic encephalomalacia at the confluence of the right posterior temporal and occipital lobes. Superimposed confluent and widespread bilateral cerebral white matter hypodensity. Superimposed chronic lacunar infarcts in the corona radiata and deep gray matter nuclei. Chronic lacunar infarct in the right paracentral pons. Small chronic cerebellar infarcts in both hemispheres. No midline shift, ventriculomegaly, mass effect, evidence of mass lesion, intracranial hemorrhage or evidence of cortically based acute infarction. Gray-white matter differentiation is within normal limits throughout the brain. Vascular: Calcified atherosclerosis at the skull base. No suspicious intracranial vascular hyperdensity. Skull: No acute osseous abnormality identified. Sinuses/Orbits: Paranasal sinuses have cleared since February. Mastoids remain clear. Other: No acute orbit or scalp soft tissue finding. ASPECTS [facility] Stroke Program Early CT Score)  Total score (0-10 with 10 being normal): 10 (chronic cortical encephalomalacia on the right and stable appearing bilateral chronic small vessel disease. IMPRESSION: 1. No acute cortically based infarct or acute intracranial hemorrhage identified. 2. Stable non contrast CT appearance of advanced chronic ischemic disease since February. 3.  ASPECTS 10 (chronic encephalomalacia). Study discussed by telephone with Dr. Daleen Bo on 03/02/2019 at 15:46 . Electronically Signed   By: Genevie Ann M.D.   On: 03/02/2019 15:47      Discharge Exam: Vitals:   03/12/2019 1142 03/06/2019 1150  BP: (!) 88/72 95/74  Pulse: 86 89  Resp:    Temp:    SpO2: 100% 100%   Vitals:   03/07/2019 1100 03/23/2019 1118 03/18/2019 1142 03/28/2019 1150  BP: 105/78 99/79 (!) 88/72 95/74  Pulse: 91 89 86 89  Resp:      Temp:      TempSrc:      SpO2:  99% 100% 100%  Weight:      Height:        General: Pt is alert, awake, not in acute distress Cardiovascular: RRR, S1/S2 +, no rubs, no gallops Respiratory: CTA bilaterally, no wheezing, no rhonchi Abdominal: Soft, NT, ND, bowel sounds + Extremities: no edema, no cyanosis Foley with no output noted.    The results of significant diagnostics from this hospitalization (including imaging, microbiology, ancillary and laboratory) are listed below for reference.     Microbiology: Recent Results (from the past 240 hour(s))  SARS CORONAVIRUS 2 (TAT 6-24 HRS) Nasopharyngeal Nasopharyngeal Swab     Status: None   Collection Time: 03/02/19  7:35 PM   Specimen: Nasopharyngeal Swab  Result Value Ref Range Status   SARS Coronavirus 2 NEGATIVE NEGATIVE Final    Comment: (NOTE) SARS-CoV-2 target nucleic acids are NOT DETECTED. The SARS-CoV-2 RNA is generally detectable in upper and lower respiratory specimens during the acute phase of infection. Negative results do not preclude SARS-CoV-2 infection, do not rule out co-infections with other pathogens, and should not be used as  the sole basis for treatment or other patient management decisions.  Negative results must be combined with clinical observations, patient history, and epidemiological information. The expected result is Negative. Fact Sheet for Patients: SugarRoll.be Fact Sheet for Healthcare Providers: https://www.woods-mathews.com/ This test is not yet approved or cleared by the [place] FDA and  has been authorized for detection and/or diagnosis of SARS-CoV-2 by FDA under an Emergency Use Authorization (EUA). This EUA will remain  in effect (meaning this test can be used) for the duration of the COVID-19 declaration under Section 56 4(b)(1) of the Act, 21 U.S.C. section 360bbb-3(b)(1), unless the authorization is terminated or revoked sooner. Performed at [facility], [place] [address] [address], [place], [place] [address]   MRSA PCR Screening     Status: None   Collection Time: 03/02/19 11:39 PM   Specimen: Nasopharyngeal  Result Value Ref Range Status   MRSA by PCR NEGATIVE NEGATIVE Final    Comment:        The GeneXpert MRSA Assay (FDA approved for NASAL specimens only), is one component of a comprehensive MRSA colonization surveillance program. It is not intended to diagnose MRSA infection nor to guide or monitor treatment for MRSA infections. Performed at [facility], [address] [place], [place] [address]      Labs: BNP (last 3 results) Recent Labs    07/04/18 0700 07/06/18 1918  BNP 697.0* 814.4*   Basic Metabolic Panel: Recent Labs  Lab 03/04/19 0511 03/05/19 0506 03/06/19 0621 03/07/19 0714 03/17/2019 0558  NA 138 138 135 135 134*  K 4.0 3.8 4.8 4.0 3.4*  CL 100 101 96* 97* 96*  CO2 24 26 25 27 25   GLUCOSE 80 115* 191* 110* 120*  BUN 30* 37* 27* 36* 23  CREATININE 3.96* 4.74* 3.73* 4.68* 3.41*  CALCIUM 8.8* 8.8* 8.6* 8.8* 8.8*  MG 2.1  --   --   --   --   PHOS 3.9 4.1 3.1  --   --    Liver Function Tests: Recent  Labs  Lab 03/02/19 1534 03/03/19 0600 03/05/19 0506 03/06/19 0621  AST 47* 42*  --   --   ALT 45* 40  --   --   ALKPHOS 161* 150*  --   --   BILITOT 1.8* 2.1*  --   --   PROT 7.0 6.6  --   --   ALBUMIN 2.9* 2.9* 2.8* 2.9*   No results for input(s): LIPASE, AMYLASE in the last 168 hours. No results for input(s): AMMONIA in the last 168 hours. CBC: Recent Labs  Lab 03/02/19 1534 03/04/19 0511 03/05/19 0506 03/06/19 0621 03/07/19 0714 03/12/2019 0558  WBC 2.4* 2.7* 2.8* 2.8* 2.3* 2.7*  NEUTROABS 1.6*  --   --  2.0  --   --   HGB 12.4* 12.7* 12.6* 11.5* 12.1* 11.5*  HCT 36.3* 38.2* 37.4* 34.5* 35.5* 32.9*  MCV 92.6 95.0 93.5 93.8 93.9 90.9  PLT 79* 79* 81* 72* 73* 68*   Cardiac Enzymes: No results for input(s): CKTOTAL, CKMB, CKMBINDEX, TROPONINI in the last 168 hours. BNP: Invalid input(s): POCBNP CBG: Recent Labs  Lab 03/07/19 0734 03/07/19 1112 03/07/19 1630 03/07/19 2117 03/21/2019 0732  GLUCAP 101* 137* 105* 134* 117*   D-Dimer No results for input(s): DDIMER in the last 72 hours. Hgb A1c No results for input(s): HGBA1C in the last 72 hours. Lipid Profile No results for input(s): CHOL, HDL, LDLCALC, TRIG, CHOLHDL, LDLDIRECT in the last 72 hours. Thyroid function studies No results for input(s): TSH, T4TOTAL, T3FREE, THYROIDAB in the  last 72 hours.  Invalid input(s): FREET3 Anemia work up No results for input(s): VITAMINB12, FOLATE, FERRITIN, TIBC, IRON, RETICCTPCT in the last 72 hours. Urinalysis    Component Value Date/Time   COLORURINE AMBER (A) 07/08/2018 0839   APPEARANCEUR CLOUDY (A) 07/08/2018 0839   LABSPEC 1.016 07/08/2018 0839   PHURINE 5.0 07/08/2018 0839   GLUCOSEU NEGATIVE 07/08/2018 0839   HGBUR SMALL (A) 07/08/2018 0839   BILIRUBINUR NEGATIVE 07/08/2018 0839   KETONESUR 5 (A) 07/08/2018 0839   PROTEINUR 100 (A) 07/08/2018 0839   UROBILINOGEN 0.2 10/23/2013 1545   NITRITE NEGATIVE 07/08/2018 0839   LEUKOCYTESUR LARGE (A) 07/08/2018 0839    Sepsis Labs Invalid input(s): PROCALCITONIN,  WBC,  LACTICIDVEN Microbiology Recent Results (from the past 240 hour(s))  SARS CORONAVIRUS 2 (TAT 6-24 HRS) Nasopharyngeal Nasopharyngeal Swab     Status: None   Collection Time: 03/02/19  7:35 PM   Specimen: Nasopharyngeal Swab  Result Value Ref Range Status   SARS Coronavirus 2 NEGATIVE NEGATIVE Final    Comment: (NOTE) SARS-CoV-2 target nucleic acids are NOT DETECTED. The SARS-CoV-2 RNA is generally detectable in upper and lower respiratory specimens during the acute phase of infection. Negative results do not preclude SARS-CoV-2 infection, do not rule out co-infections with other pathogens, and should not be used as the sole basis for treatment or other patient management decisions. Negative results must be combined with clinical observations, patient history, and epidemiological information. The expected result is Negative. Fact Sheet for Patients: SugarRoll.be Fact Sheet for Healthcare Providers: https://www.woods-mathews.com/ This test is not yet approved or cleared by the [place] FDA and  has been authorized for detection and/or diagnosis of SARS-CoV-2 by FDA under an Emergency Use Authorization (EUA). This EUA will remain  in effect (meaning this test can be used) for the duration of the COVID-19 declaration under Section 56 4(b)(1) of the Act, 21 U.S.C. section 360bbb-3(b)(1), unless the authorization is terminated or revoked sooner. Performed at [facility], [place] [address], [place], [place] [address]   MRSA PCR Screening     Status: None   Collection Time: 03/02/19 11:39 PM   Specimen: Nasopharyngeal  Result Value Ref Range Status   MRSA by PCR NEGATIVE NEGATIVE Final    Comment:        The GeneXpert MRSA Assay (FDA approved for NASAL specimens only), is one component of a comprehensive MRSA colonization surveillance program. It is not intended to diagnose  MRSA infection nor to guide or monitor treatment for MRSA infections. Performed at [facility], [address] [place], [place] [address]      Time coordinating discharge: 40 minutes  SIGNED:   Rodena Goldmann, DO [facility] 03/13/2019, 12:10 PM  If 7PM-7AM, please contact night-coverage [email or web address] Password [email or web address]

## 2019-03-08 NOTE — Care Management Important Message (Signed)
Important Message  Patient Details  Name: Keith Young MRN: [id number] Date of Birth: 11-14-1950   Medicare Important Message Given:  Yes     Tommy Medal 03/20/2019, 11:01 AM

## 2019-03-08 NOTE — Progress Notes (Signed)
Discharge report given to [facility] to come get patient. One Peripheral IV removed prior to discharge. Foley removed prior to discharge.

## 2019-03-08 NOTE — Procedures (Signed)
PreOperative Dx: Ascites Postoperative Dx: Ascites Procedure:   US guided paracentesis Radiologist:  Thornton Papas Anesthesia:  10 ml of1% lidocaine Specimen:  3.9 L of serosanguinous ascitic fluid EBL:   < 1 ml Complications: None

## 2019-03-08 NOTE — Progress Notes (Signed)
Admit: 03/02/2019 LOS: 6  55M ESRD with AMS  Subjective:  . HD yesterday, 1 L ultrafiltration, tolerated well . CT abdomen and pelvis with large amount of ascites, for paracentesis today . Patient without specific complaints  10/08 0701 - 10/09 0700 In: 840 [P.O.:840] Out: 1025 [Urine:25]  Filed Weights   03/02/19 1538  Weight: 68 kg    Scheduled Meds: . atorvastatin  80 mg Oral Daily  . bictegravir-emtricitabine-tenofovir AF  1 tablet Oral Daily  . chlorhexidine  15 mL Mouth Rinse BID  . Chlorhexidine Gluconate Cloth  6 each Topical Q0600  . cholecalciferol  2,000 Units Oral Daily  . docusate sodium  100 mg Oral Daily  . doxercalciferol  2 mcg Intravenous Q T,Th,Sa-HD  . fluticasone furoate-vilanterol  1 puff Inhalation Daily  . mouth rinse  15 mL Mouth Rinse q12n4p  . midodrine  10 mg Oral Q T,Th,Sa-HD  . multivitamin with minerals  1 tablet Oral Daily  . pantoprazole  40 mg Oral Daily  . tamsulosin  0.4 mg Oral Daily  . Warfarin - Pharmacist Dosing Inpatient   Does not apply q1800   Continuous Infusions: . sodium chloride    . sodium chloride     PRN Meds:.sodium chloride, sodium chloride, acetaminophen, albuterol, lidocaine (PF), lidocaine-prilocaine, pentafluoroprop-tetrafluoroeth, Resource ThickenUp Clear, senna-docusate  Current Labs: reviewed    Physical Exam:  Blood pressure 93/74, pulse 93, temperature 98 F (36.7 C), temperature source Oral, resp. rate 20, height 5\' 3"  (1.6 m), weight 68 kg, SpO2 92 %. Chronically ill-appearing, NAD Regular, normal S1-S2 Coarse breath sounds bilaterally, diminished in the bases + B/T in left arm AVG S/nt, distended   Dialyzes at [facility]-TTS-4 hours EDW 66.5. HD Bath 2K/2.5 calcium, Dialyzer unknown, Heparin unknown. Access left AV graft-15-gauge, 400/600.  Hectorol 2 mics with treatment  A 1. ESRD TTS [facility] on schedule, LUE AVG 2. AMS , MRI negative for acute CVA, TRH and Neuro following;  stable 3. BP, on midodrine 4. ANemia, Hb stable 5. HIV on ARV 6. CKD BMD, stable 7. Ascites, likely multifactorial including dialysis, chronic hypotension, heart failure  P . Cont HD on schedule: 3K 1-2L UF as BP permits, AVG 998/338,  . Medication Issues; o Preferred narcotic agents for pain control are hydromorphone, fentanyl, and methadone. Morphine should not be used.  o Baclofen should be avoided o Avoid oral sodium phosphate and magnesium citrate based laxatives / bowel preps    Pearson Grippe MD 03/13/2019, 9:41 AM  Recent Labs  Lab 03/04/19 0511 03/05/19 0506 03/06/19 0621 03/07/19 0714 03/07/2019 0558  NA 138 138 135 135 134*  K 4.0 3.8 4.8 4.0 3.4*  CL 100 101 96* 97* 96*  CO2 24 26 25 27 25   GLUCOSE 80 115* 191* 110* 120*  BUN 30* 37* 27* 36* 23  CREATININE 3.96* 4.74* 3.73* 4.68* 3.41*  CALCIUM 8.8* 8.8* 8.6* 8.8* 8.8*  PHOS 3.9 4.1 3.1  --   --    Recent Labs  Lab 03/02/19 1534  03/06/19 0621 03/07/19 0714 03/21/2019 0558  WBC 2.4*   < > 2.8* 2.3* 2.7*  NEUTROABS 1.6*  --  2.0  --   --   HGB 12.4*   < > 11.5* 12.1* 11.5*  HCT 36.3*   < > 34.5* 35.5* 32.9*  MCV 92.6   < > 93.8 93.9 90.9  PLT 79*   < > 72* 73* 68*   < > = values in this interval not displayed.

## 2019-03-08 NOTE — TOC Transition Note (Signed)
Transition of Care [facility]) - CM/SW Discharge Note   Patient Details  Name: CHESNEY KLIMASZEWSKI MRN: [id number] Date of Birth: 1951-01-20  Transition of Care [facility]) CM/SW Contact:  Shade Flood, LCSW Phone Number: 03/29/2019, 11:26 AM   Clinical Narrative:     Pt stable for return to [facility] today per MD. Updated Marianna Fuss at [facility]. DC clinical will be sent electronically. RN can call report when pt ready. Updated pt's family by phone.  There are no other TOC needs for dc.  Final next level of care: Skilled Nursing Facility Barriers to Discharge: Barriers Resolved   Patient Goals and CMS Choice        Discharge Placement                       Discharge Plan and Services In-house Referral: Clinical Social Work                                   Social Determinants of Health (SDOH) Interventions     Readmission Risk Interventions Readmission Risk Prevention Plan 03/01/2019 03/04/2019  Transportation Screening - Complete  Medication Review [employer]) - Complete  PCP or Specialist appointment within 3-5 days of discharge - Not Complete  PCP/Specialist Appt Not Complete comments - SNF MD to follow  [place] or [place] Not Complete -  [place] or Home Care Consult Pt Refusal Comments Pt returning to SNF -  SW Recovery Care/Counseling Consult - Complete  [place] - Complete  Some recent data might be hidden

## 2019-03-08 NOTE — Care Management Important Message (Signed)
Important Message  Patient Details  Name: Keith Young MRN: [id number] Date of Birth: 05/06/1951   Medicare Important Message Given:  Yes     Tommy Medal 03/13/2019, 12:39 PM

## 2019-03-11 ENCOUNTER — Other Ambulatory Visit: Payer: Self-pay | Admitting: Adult Health

## 2019-03-11 ENCOUNTER — Non-Acute Institutional Stay (SKILLED_NURSING_FACILITY): Payer: Medicare HMO | Admitting: Adult Health

## 2019-03-11 ENCOUNTER — Encounter: Payer: Self-pay | Admitting: Adult Health

## 2019-03-11 DIAGNOSIS — K5909 Other constipation: Secondary | ICD-10-CM

## 2019-03-11 DIAGNOSIS — I480 Paroxysmal atrial fibrillation: Secondary | ICD-10-CM

## 2019-03-11 DIAGNOSIS — E1122 Type 2 diabetes mellitus with diabetic chronic kidney disease: Secondary | ICD-10-CM | POA: Diagnosis not present

## 2019-03-11 DIAGNOSIS — N138 Other obstructive and reflux uropathy: Secondary | ICD-10-CM

## 2019-03-11 DIAGNOSIS — E43 Unspecified severe protein-calorie malnutrition: Secondary | ICD-10-CM

## 2019-03-11 DIAGNOSIS — K219 Gastro-esophageal reflux disease without esophagitis: Secondary | ICD-10-CM

## 2019-03-11 DIAGNOSIS — Z992 Dependence on renal dialysis: Secondary | ICD-10-CM

## 2019-03-11 DIAGNOSIS — I5042 Chronic combined systolic (congestive) and diastolic (congestive) heart failure: Secondary | ICD-10-CM | POA: Diagnosis not present

## 2019-03-11 DIAGNOSIS — N185 Chronic kidney disease, stage 5: Secondary | ICD-10-CM

## 2019-03-11 DIAGNOSIS — I2510 Atherosclerotic heart disease of native coronary artery without angina pectoris: Secondary | ICD-10-CM

## 2019-03-11 DIAGNOSIS — R55 Syncope and collapse: Secondary | ICD-10-CM

## 2019-03-11 DIAGNOSIS — E1169 Type 2 diabetes mellitus with other specified complication: Secondary | ICD-10-CM

## 2019-03-11 DIAGNOSIS — F0150 Vascular dementia without behavioral disturbance: Secondary | ICD-10-CM

## 2019-03-11 DIAGNOSIS — I69391 Dysphagia following cerebral infarction: Secondary | ICD-10-CM

## 2019-03-11 DIAGNOSIS — N401 Enlarged prostate with lower urinary tract symptoms: Secondary | ICD-10-CM

## 2019-03-11 DIAGNOSIS — J438 Other emphysema: Secondary | ICD-10-CM

## 2019-03-11 DIAGNOSIS — E785 Hyperlipidemia, unspecified: Secondary | ICD-10-CM

## 2019-03-11 DIAGNOSIS — N186 End stage renal disease: Secondary | ICD-10-CM

## 2019-03-11 DIAGNOSIS — D631 Anemia in chronic kidney disease: Secondary | ICD-10-CM

## 2019-03-11 DIAGNOSIS — Z95810 Presence of automatic (implantable) cardiac defibrillator: Secondary | ICD-10-CM

## 2019-03-11 DIAGNOSIS — I120 Hypertensive chronic kidney disease with stage 5 chronic kidney disease or end stage renal disease: Secondary | ICD-10-CM

## 2019-03-11 LAB — CYTOLOGY - NON PAP

## 2019-03-11 NOTE — Progress Notes (Signed)
Location:    [facility] Room Number: 154/W Place of Service:  SNF (31)   CODE STATUS: DNR  No Known Allergies  Chief Complaint  Patient presents with   Hospitalization Follow-up        HPI:  Keith Young is a complex 68 year old long term resident of this facility who has been hospitalized from 03-02-19- through 03/23/2019. Keith Young was initially felt to have a cva which was ruled. It is more likely that Keith Young experienced a syncopal episode due to his severely impaired EF of 15-20%. Keith Young has been retaining fluid as Keith Young is unable to tolerate a full session of dialysis treatment. Keith Young did require a paracentesis. There are no reports of cough; shortness of breath; no reports of agitation; or anxiety. His appetite is adequate. Keith Young will continue to be followed for his chronic illnesses including: dementia diabetes; hypertension   Past Medical History:  Diagnosis Date   AICD (automatic cardioverter/defibrillator) present    Alcoholism in remission ([place])    Anemia    Aortic insufficiency    Arthritis    At moderate risk for fall    Bell's palsy    Chronic systolic heart failure ([place])    a. EF previously 15-20% with cath in 2014 showing no significant CAD b. EF 20-25% by echo in 06/2018   CKD (chronic kidney disease), stage IV (HCC)    COPD (chronic obstructive pulmonary disease) ([place])    Essential hypertension, benign    HIV disease ([place]) 09/06/2016   Hyperlipidemia    NICM (nonischemic cardiomyopathy) ([place])    Noncompliance with medication regimen    NSVT (nonsustained ventricular tachycardia) (HCC)    Numbness of right jaw    Had a stoke in 01/2013. Numbness is occasional, especially when trying to chew.   OSA (obstructive sleep apnea)    Productive cough 08/2013   With brown sputum    S/P aortic valve replacement with bioprosthetic valve    Stroke ([place]) 01/2013   weakness of right side from CVA   Type 2 diabetes mellitus ([place])    Type 2 diabetes mellitus  with hypertension and end stage renal disease on dialysis ([place]) 07/31/2018   Uses hearing aid 2014   recently received new hearing aids    Past Surgical History:  Procedure Laterality Date   AV FISTULA PLACEMENT Left 07/20/2018   Procedure: INSERTION ARTERIOVENOUS GORTEX GRAFT  LEFT ARM;  Surgeon: Rosetta Posner, MD;  Location: [facility];  Service: Vascular;  Laterality: Left;   BIOPSY N/A 04/10/2014   Procedure: GASTRIC BIOPSY;  Surgeon: Daneil Dolin, MD;  Location: [facility];  Service: Endoscopy;  Laterality: N/A;   BIOPSY  10/12/2015   Procedure: BIOPSY;  Surgeon: Daneil Dolin, MD;  [facility];  Service: Endoscopy;;  stomach bx's   CARDIAC DEFIBRILLATOR PLACEMENT  11/12/12   [employer] Inogen MINI ICD implanted in [place] at [place] [facility] per Dr Nelly Laurence note   COLONOSCOPY WITH PROPOFOL N/A 04/10/2014   RMR: Colonic Diverticulosis   ESOPHAGOGASTRODUODENOSCOPY (EGD) WITH PROPOFOL N/A 04/10/2014   RMR: Mild erosive reflux esophagitis. Multiple antral polyps likely hyperplastic status post removal by hot snare cautery technique. Diffusely abnormal stomach status post gastric biopsy I suspect some of patients anemaia may be due to intermittent oozing from the stomach. It would be difficult and a risky proposition to attempt complete removal of all of his gastric polyps.   ESOPHAGOGASTRODUODENOSCOPY (  EGD) WITH PROPOFOL N/A 10/12/2015   Procedure: ESOPHAGOGASTRODUODENOSCOPY (EGD) WITH PROPOFOL;  Surgeon: Daneil Dolin, MD;  [facility];  Service: Endoscopy;  Laterality: N/A;  6606 - moved to [place] Right 07/20/2018   Procedure: EXCHANGE OF DIALYSIS CATHETER RIGHT INTERNAL JUGULAR;  Surgeon: Rosetta Posner, MD;  Location: [facility];  Service: Vascular;  Laterality: Right;   IR FLUORO GUIDE CV LINE RIGHT  07/09/2018   IR US GUIDE VASC ACCESS RIGHT  07/09/2018   POLYPECTOMY N/A 04/10/2014     Procedure: GASTRIC POLYPECTOMY;  Surgeon: Daneil Dolin, MD;  Location: [facility];  Service: Endoscopy;  Laterality: N/A;   RIGHT HEART CATHETERIZATION N/A 02/06/2014   Procedure: RIGHT HEART CATH;  Surgeon: Larey Dresser, MD;  Location: [facility] CATH LAB;  Service: Cardiovascular;  Laterality: N/A;    Social History   Socioeconomic History   Marital status: Single    Spouse name: Not on file   Number of children: Not on file   Years of education: Not on file   Highest education level: Not on file  Occupational History   Occupation: disabled  Social Designer, fashion/clothing strain: Not hard at all   Food insecurity    Worry: Never true    Inability: Never true   Transportation needs    Medical: No    Non-medical: No  Tobacco Use   Smoking status: Former Smoker    Types: Cigarettes    Quit date: 11/05/1993    Years since quitting: 25.3   Smokeless tobacco: Never Used  Substance and Sexual Activity   Alcohol use: Not Currently    Comment: former heavy ETOH, none recently   Drug use: No    Comment: prior history of cocaine use, smoking    Sexual activity: Yes    Birth control/protection: None  Lifestyle   Physical activity    Days per week: 0 days    Minutes per session: 0 min   Stress: Not at all  Relationships   Social connections    Talks on phone: Never    Gets together: Once a week    Attends religious service: Never    Active member of club or organization: No    Attends meetings of clubs or organizations: Never    Relationship status: Never married   Intimate partner violence    Fear of current or ex partner: No    Emotionally abused: No    Physically abused: No    Forced sexual activity: No  Other Topics Concern   Not on file  Social History Narrative    Has h/o polysubstance abuse.  Baseline    2019- resident of [facility]. Unable to ambulate unassisted.            Family History  Problem Relation Age of Onset   Kidney  disease Mother    Kidney disease Sister    Colon cancer Neg Hx       VITAL SIGNS Pulse 82    Temp 97.9 F (36.6 C) (Oral)    Resp 18    Ht '5\' 3"'  (1.6 m)    Wt 139 lb 12.8 oz (63.4 kg)    SpO2 94%    BMI 24.76 kg/m   Outpatient Encounter Medications as of 03/11/2019  Medication Sig   acetaminophen (TYLENOL) 325 MG tablet Take 2 tablets (650 mg total) by mouth every 4 (four) hours as needed for mild pain (  or temp > 37.5 C (99.5 F)).   albuterol (PROVENTIL) (2.5 MG/3ML) 0.083% nebulizer solution Take 3 mLs (2.5 mg total) by nebulization every 4 (four) hours as needed for wheezing or shortness of breath.   atorvastatin (LIPITOR) 80 MG tablet Take 80 mg by mouth daily.    bictegravir-emtricitabine-tenofovir AF (BIKTARVY) 50-200-25 MG TABS tablet Take 1 tablet by mouth daily.   calcium carbonate (TUMS EX) 750 MG chewable tablet Chew 1 tablet by mouth 2 (two) times daily.   Cholecalciferol (VITAMIN D3) 2000 UNITS TABS Take 2,000 mg by mouth daily.   coal tar (NEUTROGENA T-GEL) 0.5 % shampoo Apply to scalp on shower days (Twice a week) for dandruff and dry scalp Wed., and Sat.   docusate sodium (COLACE) 100 MG capsule Take 100 mg by mouth daily.   Fluticasone-Salmeterol (ADVAIR) 100-50 MCG/DOSE AEPB Inhale 1 puff into the lungs daily.   metoprolol succinate (TOPROL-XL) 25 MG 24 hr tablet Take 1 tablet (25 mg total) by mouth daily.   midodrine (PROAMATINE) 10 MG tablet Take 10 mg by mouth. Send medication with resident to be given at center. Once a day on Tues.,Thurs., Sat.   multivitamin (RENA-VIT) TABS tablet Take 1 tablet by mouth at bedtime.   NON FORMULARY Diet Type:  Dysphagia 2 with thin liquids (NO Straws)   Nutritional Supplements (FEEDING SUPPLEMENT, NEPRO CARB STEADY,) LIQD Take by mouth daily.   omeprazole (PRILOSEC) 20 MG capsule Take 20 mg by mouth at bedtime.   senna-docusate (SENEXON-S) 8.6-50 MG tablet Take 1 tablet by mouth daily.   tamsulosin (FLOMAX) 0.4 MG  CAPS capsule Take 0.4 mg by mouth daily.   UNABLE TO FIND FLUID RESTRICTION 1200 CC/ 24hrs. Breakfast (12oz) 360 ml lunch (8oz) 240 ml Dinner (12oz) 360 ml Every Shift Day, Evening, Night   warfarin (COUMADIN) 1 MG tablet Take 1 mg by mouth daily. Give along with 2.5 mg tab for total of 3.5 daily   warfarin (COUMADIN) 2.5 MG tablet Take 2.5 mg by mouth daily. Give along with 1 mg tab for total of 3.5 mg daily   ZINC OXIDE EX Apply Zinc oxide to bilateral buttocks area for irritation. Every Shift Day, Day, Day   No facility-administered encounter medications on file as of 03/11/2019.      SIGNIFICANT DIAGNOSTIC EXAMS  PREVIOUS   07-07-18: US renal: Medical renal disease changes of both kidneys. Small renal cysts in both kidneys. No evidence of hydronephrosis.  07-11-18: ct of head:  1. Stable head CT, with no new acute intracranial abnormality. 2. In band cerebral atrophy with chronic small vessel ischemic disease with multiple chronic ischemic infarcts as above. 3. Acute pan sinusitis, worsened from previous.  05-12-19 ct of abdomen and pelvis: Diverticulosis without diverticulitis. Bilateral renal cysts. Mild bibasilar atelectasis.  TODAY;   03-02-19: ct of head:  1. No acute cortically based infarct or acute intracranial hemorrhage identified. 2. Stable non contrast CT appearance of advanced chronic ischemic disease since February. 3.  ASPECTS 10 (chronic encephalomalacia).  03-02-19: ct angio of head and neck CTA head: 1. No intracranial large vessel occlusion. 2. Moderate focal stenosis within the P2 left PCA. 3. Mild focal stenosis within the right PCA at the P1-P2 junction. 4. Additional intracranial atherosclerotic disease without significant stenosis as described. CTA neck: 1. Atherosclerotic disease within the imaged aortic arch and major branch vessels of the neck as detailed and most notably as follows. 2. Approximately 60-70% stenosis of the proximal left  ICA. 3. Less than 50%  stenosis of the proximal right ICA. 4. Mild/moderate ostial stenosis of the dominant left vertebral artery. The bilateral vertebral arteries are otherwise patent within the neck without significant stenosis. 5. Anasarca. Partially visualized bilateral pleural effusions. The right pleural effusion may be loculated. 6. Sequela of renal osteodystrophy.  03-03-19" 2-d echo 1. Left ventricular ejection fraction, by visual estimation, is 15-20%. The left ventricle has severely decreased function. Moderately increased left ventricular size. There is mildly increased left ventricular hypertrophy. There is global hypokinesis  with some regional variation.  2. Left ventricular diastolic Doppler parameters are consistent with impaired relaxation pattern of LV diastolic filling.  3. Global right ventricle has mildly reduced systolic function.The right ventricular size is normal. No increase in right ventricular wall thickness.  4. Left atrial size was mildly dilated.  5. Right atrial size was moderately dilated.  6. The mitral valve is grossly normal. Mild mitral valve regurgitation.  7. The tricuspid valve is grossly normal. Tricuspid valve regurgitation moderate.  8. The pulmonic valve was grossly normal. Pulmonic valve regurgitation is trivial by color flow Doppler.  9. Mildly elevated pulmonary artery systolic pressure. 10. A ICD wire is visualized. 11. The inferior vena cava is dilated in size with <50% respiratory variability, suggesting right atrial pressure of 15 mmHg. 12. Bioprosthesis in aortic position (unknown size and type). No perivalvular leak. Acoustic shadowing noted in association with leaflet motion - unable to completely exclude vegetation based on images. If clinically indicated and feasible, could  consider TEE. 13. Aortic valve regurgitation was not visualized by color flow Doppler. 14. Aortic valve mean gradient measures 10.7 mmHg. 15. The tricuspid regurgitant  velocity is 2.17 m/s, and with an assumed right atrial pressure of 15 mmHg, the estimated right ventricular systolic pressure is mildly elevated at 33.8 mmHg.   03-05-19: EEG: This recording of awake and drowsy states shows mild global slowing indicating a mild global encephalopathy. However, no epileptiform activities are documented.   03-05-19; MRI of brain: No acute infarct Atrophy with extensive chronic ischemic changes.  03-07-19: ct of abdomen and pelvis:  1. Interval large amount of free peritoneal fluid in the abdomen and pelvis. 2. Interval diffuse subcutaneous edema. 3. Small to moderate-sized right pleural effusion and small left pleural effusion. 4. Mild increase in prominence of the interstitial markings, compatible with interstitial pulmonary edema. 5. Stable possible early changes of cirrhosis of the liver. 6. Colonic diverticulosis. 7. Bilateral L5 spondylolysis with minimal spondylolisthesis at the L5-S1 level. 8. 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 9. Stable cardiomegaly and dense calcific coronary artery and aortic atherosclerosis.  03/26/2019: paracentesis: Successful ultrasound-guided paracentesis yielding 3.9 liters of peritoneal fluid.    LABS REVIEWED PREVIOUS:   05-22-18: hgb a1c 6.5 chol 100 ldl 43; trig 170; hdl 23 07-04-18: wbc  3.7; hgb 14.0; hct 45.6; mcv 91.8; plt 124; glucose 100; bun 45; creat 2.44; k+ 4.7; na++ 139 BNP 679.0  07-06-18: wbc 1.9; hgb 15.2; hct 48.8; mcv 91.2 plt 107; glucose 136; bun 43; creat 3.01; k+ 5.9; na++ 136; ca 8.8 ast 70 alt 46; albumin 3.0 BNP 554.5   blood culture: no growth /e-coli  07-08-18: wbc 13.8; hgb 13.1; hct 41.1; mcv 93.2 plt 73 glucose 108 ;bun 74; creat 5.32; k+ 5.5; na++ 139; ca 7.5; ast 80 alt 117; albumin 2.3  07-09-18; tsh 3.098 07-17-18: wbc 3.1; hgb 1.8; hct 37.3; mcv 89.9 plt 103 glucose 114; bun 95; creat 6.00; k+ 3.5; na++  139; ca 6.4 mag 2.1 07-27-18: INR 2.3: coumadin 5 mg  daily   07-30-18: wbc  6.8; hgb 10.7; hct 33.6; mcv 89.6; plt 94 glucose 90; bun 93; creat 7.47; k+ 4.2; na++ 135; ca 7.1 08-02-18: INR 1.9   08-15-18: HIV quant: <20 CD4: 360  08-16-18: INR 3.2  08-23-18: INR  1.7 09-04-18: hgb 7.4 hct 23.6  glucose 90; bun 16; create 2.64; k+ 2.8; na++ 138; ca 8.3 phos 3.0; albumin 2.9 iron 52; tibc 218; PTH 124 vit 69.3  09-06-18: INR 2.5 guaiac neg  09-07-18: glucose 99; bun 22; creat 3.24; k+ 3.4;na++ 137; ca 8.4  09-11-18 hgb 10.3; hct 33.9 glucose 83; bun 18; creat 2.48; k+ 3.8; na++ 140; ca 8.9 iron 43; tibc 225 PTH 115 vit D 44.6 09-20-18: INR 2.1 09-22-18: guaiac: neg  10-13-18: INR 2.4 10-19-18: INR 2.1 11-02-18: INR 2.2  11-16-18: INR 3.8 11-19-18: INR 2.2 11-26-18: INR 1.6  12-05-18: hgb a1c 6.1 12-10-18: INR 1.9 12-18-18: INR 2.2  01-01-19: INR 2.3  01-10-19: wbc 2.3; hgb 12.1; hct 37.5; mcv 96.9; plt 101; INR 3.0 01-15-19: wbc 2.0; hgb 12.8; hct 39.5; mcv 96.1; plt 100; glucose 126; bun 40; creat 5.45; k+ 3.8; na++ 136; ca 8.8; liver normal albumin 3.2; PSA 0.98; INR 3.9 01-22-19; INR 3.1  01-29-19: INR 2.7 02-12-19: INR 4.6  02-15-19: INR 2.9  02-26-19: INR 1.9   TODAY:   03-02-19: wbc 2.4; hgb 12.4; hct 36.3; mcv 92.6; plt 79; glucose 106; bun 19; creat 2.53; k+ 3.1; na++ 136; ca 8.3; ast 47 alt 45; alk phos 161; total bili 1.8; albumin 2.9; INR 2.0 03-03-19: chol 113; ldl 68; trig 71; hdl 31 03-04-19: wbc 2.7; hgb 12.7; hct 38.2; mcv 95.0 plt 79; glucose 80; bun 30; creat 3.96; k+ 4.0; na++ 138; ca 8.8; mag 2.1; phos 3.9 03/25/2019: wbc 2.7; hgb 11.5; hct 32.9; mcv 90.9 ;plt 68; glucose 120; bun 23; creat 3.41; k+ 3.4; na++ 134; ca 8.8   Review of Systems  Unable to perform ROS: Dementia (unable to participate )    Physical Exam Constitutional:      General: Keith Young is not in acute distress.    Appearance: Keith Young is well-developed. Keith Young is not diaphoretic.  Neck:     Musculoskeletal: Neck supple.     Thyroid: No thyromegaly.  Cardiovascular:     Rate and Rhythm: Normal  rate. Rhythm irregular.     Pulses: Normal pulses.     Heart sounds: Murmur present.     Comments: 2/6 ICD present  Pulmonary:     Effort: Pulmonary effort is normal. No respiratory distress.     Breath sounds: Normal breath sounds.  Abdominal:     General: Bowel sounds are normal. There is no distension.     Palpations: Abdomen is soft.     Tenderness: There is no abdominal tenderness.  Musculoskeletal:     Right lower leg: No edema.     Left lower leg: No edema.     Comments: Able to move all extremities   Lymphadenopathy:     Cervical: No cervical adenopathy.  Skin:    General: Skin is warm and dry.     Comments: Left upper arm a/v fistula: + thrill + bruit      Neurological:     Mental Status: Keith Young is alert.  Psychiatric:        Mood and Affect: Mood normal.      ASSESSMENT/ PLAN:  TODAY:   1.  Hypertension associated with stage 5 chronic kidney disease due to type 2 diabetes mellitus: is stable b/p 110/52 will continue toprol xl 25 mg daily   2. PAF (paroxysmal atrial fibrillation) status post bioprosthetic AVR heart rate is stable will continue toprol xl 25 mg daily for rate control is on chronic coumadin therapy. Will need INR in the AM.   3. Chronic combined systolic and diastolic congestive heart failure: status post ICD placement is without change; EF 15-20% (03-04-19) will continue toprol xl 25 mg daily is on 1200 cc fluid restriction; is on hemodialysis three times weekly to help manage his fluids.   4. Dyslipidemia associated with type 2 diabetes mellitus: is stable LDL 68 will continue lipitor 80 mg daily  5. 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 Chronic kidney disease with end stage renal disease on dialysis due to type 2 diabetes mellitus/dialysis dependent: no change in status; is dialyzed three times weekly;  followed by nephrology. Is on 1200 cc fluid restriction; midodrine 10 mg with hemodialysis  7.  Other emphysema is stable will continue albuterol neb every 4 hours as needed will continue advair 100/50 1 puff daily   8. HIV disease without change will continue bikatarvy 20-200-25 mg daily   9. CVA/Multiple CVA; is neurologically stable is on chronic coumadin therapy.   10. Anemia due to end stage renal disease is stable hgb 11.5 will monitor  11. 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 Protein calorie malnutrition severe; stable albumin 2.9 will continue supplements as directed weight is 140 pounds.   13. PBH with obstruction/urinary tract symptoms: is stable will continue flomax 0.4 mg daily   14. GERD without esophagitis: is stable will continue prilosec 20 mg daily   15. Chronic constipation: is stable will continue colace and senna s daily   16. 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   17. Vascular dementia without behavioral disturbance: is without change weight is 140 pounds will monitor   18. Syncope and collapse: is stable; more than likely due to EF. Will monitor his status.   Will check CMP     MD is aware of resident's narcotic use and is in agreement with current plan of care. We will attempt to wean resident as appropriate.  Ok Edwards NP [facility]  Contact [phone] Monday through Friday 8am- 5pm  After hours call [phone]

## 2019-03-12 ENCOUNTER — Encounter (HOSPITAL_COMMUNITY)
Admission: RE | Admit: 2019-03-12 | Discharge: 2019-03-12 | Disposition: A | Discharge status: Home / Self Care | Payer: Medicare HMO | Source: Skilled Nursing Facility | Attending: Internal Medicine | Admitting: Internal Medicine

## 2019-03-12 ENCOUNTER — Non-Acute Institutional Stay (SKILLED_NURSING_FACILITY): Payer: Medicare HMO | Admitting: Adult Health

## 2019-03-12 ENCOUNTER — Encounter: Payer: Self-pay | Admitting: Adult Health

## 2019-03-12 DIAGNOSIS — I480 Paroxysmal atrial fibrillation: Secondary | ICD-10-CM | POA: Diagnosis not present

## 2019-03-12 DIAGNOSIS — N186 End stage renal disease: Secondary | ICD-10-CM | POA: Insufficient documentation

## 2019-03-12 DIAGNOSIS — Z7901 Long term (current) use of anticoagulants: Secondary | ICD-10-CM | POA: Insufficient documentation

## 2019-03-12 DIAGNOSIS — I132 Hypertensive heart and chronic kidney disease with heart failure and with stage 5 chronic kidney disease, or end stage renal disease: Secondary | ICD-10-CM | POA: Diagnosis not present

## 2019-03-12 DIAGNOSIS — I639 Cerebral infarction, unspecified: Secondary | ICD-10-CM | POA: Diagnosis not present

## 2019-03-12 DIAGNOSIS — Z952 Presence of prosthetic heart valve: Secondary | ICD-10-CM

## 2019-03-12 LAB — COMPREHENSIVE METABOLIC PANEL
ALT: 33 U/L (ref 0–44)
AST: 38 U/L (ref 15–41)
Albumin: 2.9 g/dL — ABNORMAL LOW (ref 3.5–5.0)
Alkaline Phosphatase: 128 U/L — ABNORMAL HIGH (ref 38–126)
Anion gap: 13 (ref 5–15)
BUN: 37 mg/dL — ABNORMAL HIGH (ref 8–23)
CO2: 27 mmol/L (ref 22–32)
Calcium: 8.6 mg/dL — ABNORMAL LOW (ref 8.9–10.3)
Chloride: 93 mmol/L — ABNORMAL LOW (ref 98–111)
Creatinine, Ser: 5.01 mg/dL — ABNORMAL HIGH (ref 0.61–1.24)
GFR calc Af Amer: 13 mL/min — ABNORMAL LOW (ref >60)
GFR calc non Af Amer: 11 mL/min — ABNORMAL LOW (ref >60)
Glucose, Bld: 100 mg/dL — ABNORMAL HIGH (ref 70–99)
Potassium: 3.8 mmol/L (ref 3.5–5.1)
Sodium: 133 mmol/L — ABNORMAL LOW (ref 135–145)
Total Bilirubin: 1.5 mg/dL — ABNORMAL HIGH (ref 0.3–1.2)
Total Protein: 6.5 g/dL (ref 6.5–8.1)

## 2019-03-12 LAB — PROTIME-INR
INR: 2.2 — ABNORMAL HIGH (ref 0.8–1.2)
Prothrombin Time: 23.7 seconds — ABNORMAL HIGH (ref 11.4–15.2)

## 2019-03-12 NOTE — Progress Notes (Signed)
Location:    [facility] Room Number: 154/W Place of Service:  SNF (31)   CODE STATUS: DNR  No Known Allergies  Chief Complaint  Patient presents with   Anticoagulation    IND,     HPI:   He is on long term coumadin therapy for afib; cva and valve replacement. His INR goal is 25.-3.0. his INR today is 2.2  There are no reports of dietary changes; no reports of chest pain; no excessive weakness; no reports of missed doses no reports of excessive bleeding or bruising.    Past Medical History:  Diagnosis Date   AICD (automatic cardioverter/defibrillator) present    Alcoholism in remission ([place])    Anemia    Aortic insufficiency    Arthritis    At moderate risk for fall    Bell's palsy    Chronic systolic heart failure ([place])    a. EF previously 15-20% with cath in 2014 showing no significant CAD b. EF 20-25% by echo in 06/2018   CKD (chronic kidney disease), stage IV (HCC)    COPD (chronic obstructive pulmonary disease) ([place])    Essential hypertension, benign    HIV disease ([place]) 09/06/2016   Hyperlipidemia    NICM (nonischemic cardiomyopathy) ([place])    Noncompliance with medication regimen    NSVT (nonsustained ventricular tachycardia) (HCC)    Numbness of right jaw    Had a stoke in 01/2013. Numbness is occasional, especially when trying to chew.   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 ([place]) 07/31/2018   Uses hearing aid 2014   recently received new hearing aids    Past Surgical History:  Procedure Laterality Date   AV FISTULA PLACEMENT Left 07/20/2018   Procedure: INSERTION ARTERIOVENOUS GORTEX GRAFT  LEFT ARM;  Surgeon: Rosetta Posner, MD;  Location:  [facility];  Service: Vascular;  Laterality: Left;   BIOPSY N/A 04/10/2014   Procedure: GASTRIC BIOPSY;  Surgeon: Daneil Dolin, MD;  Location: [facility];  Service: Endoscopy;  Laterality: N/A;   BIOPSY  10/12/2015   Procedure: BIOPSY;  Surgeon: Daneil Dolin, MD;  Location: [facility];  Service: Endoscopy;;  stomach bx's   CARDIAC DEFIBRILLATOR PLACEMENT  11/12/12   [employer] Inogen MINI ICD implanted in [place] at [place] [facility] per Dr Nelly Laurence note   COLONOSCOPY WITH PROPOFOL N/A 04/10/2014   RMR: Colonic Diverticulosis   ESOPHAGOGASTRODUODENOSCOPY (EGD) WITH PROPOFOL N/A 04/10/2014   RMR: Mild erosive reflux esophagitis. Multiple antral polyps likely hyperplastic status post removal by hot snare cautery technique. Diffusely abnormal stomach status post gastric biopsy I suspect some of patients anemaia may be due to intermittent oozing from the stomach. It would be difficult and a risky proposition to attempt complete removal of all of his gastric polyps.   ESOPHAGOGASTRODUODENOSCOPY (EGD) WITH PROPOFOL N/A 10/12/2015   Procedure: ESOPHAGOGASTRODUODENOSCOPY (EGD) WITH PROPOFOL;  Surgeon: Daneil Dolin, MD;  [facility];  Service: Endoscopy;  Laterality: N/A;  [id number] - moved to [place] Right 07/20/2018   Procedure: EXCHANGE OF DIALYSIS CATHETER RIGHT INTERNAL  JUGULAR;  Surgeon: Rosetta Posner, MD;  Location: [facility];  Service: Vascular;  Laterality: Right;   IR FLUORO GUIDE CV LINE RIGHT  07/09/2018   IR US GUIDE VASC ACCESS RIGHT  07/09/2018   POLYPECTOMY N/A 04/10/2014   Procedure: GASTRIC POLYPECTOMY;  Surgeon: Daneil Dolin, MD;  Location: [facility];  Service: Endoscopy;  Laterality: N/A;   RIGHT HEART CATHETERIZATION N/A 02/06/2014   Procedure: RIGHT HEART CATH;  Surgeon: Larey Dresser, MD;  Location: [facility] CATH LAB;  Service: Cardiovascular;  Laterality: N/A;    Social History    Socioeconomic History   Marital status: Single    Spouse name: Not on file   Number of children: Not on file   Years of education: Not on file   Highest education level: Not on file  Occupational History   Occupation: disabled  Social Designer, fashion/clothing strain: Not hard at all   Food insecurity    Worry: Never true    Inability: Never true   Transportation needs    Medical: No    Non-medical: No  Tobacco Use   Smoking status: Former Smoker    Types: Cigarettes    Quit date: 11/05/1993    Years since quitting: 25.3   Smokeless tobacco: Never Used  Substance and Sexual Activity   Alcohol use: Not Currently    Comment: former heavy ETOH, none recently   Drug use: No    Comment: prior history of cocaine use, smoking    Sexual activity: Yes    Birth control/protection: None  Lifestyle   Physical activity    Days per week: 0 days    Minutes per session: 0 min   Stress: Not at all  Relationships   Social connections    Talks on phone: Never    Gets together: Once a week    Attends religious service: Never    Active member of club or organization: No    Attends meetings of clubs or organizations: Never    Relationship status: Never married   Intimate partner violence    Fear of current or ex partner: No    Emotionally abused: No    Physically abused: No    Forced sexual activity: No  Other Topics Concern   Not on file  Social History Narrative    Has h/o polysubstance abuse.  Baseline    2019- resident of [facility]. Unable to ambulate unassisted.            Family History  Problem Relation Age of Onset   Kidney disease Mother    Kidney disease Sister    Colon cancer Neg Hx       VITAL SIGNS BP 102/69    Pulse 76    Temp 98.2 F (36.8 C) (Oral)    Resp 18    Ht '5\' 3"'  (1.6 m)    Wt 140 lb 6.4 oz (63.7 kg)    BMI 24.87 kg/m   Outpatient Encounter Medications as of 03/12/2019  Medication Sig   acetaminophen  (TYLENOL) 325 MG tablet Take 2 tablets (650 mg total) by mouth every 4 (four) hours as needed for mild pain (or temp > 37.5 C (99.5 F)).   albuterol (PROVENTIL) (2.5 MG/3ML) 0.083% nebulizer solution Take 3 mLs (2.5 mg total) by nebulization every 4 (four) hours as needed for wheezing or shortness of breath.   atorvastatin (LIPITOR) 80 MG tablet Take 80 mg by mouth daily.    Roseanne Kaufman  [place]-Castor Oil (VENELEX) OINT Ointment; - ; topical  Special Instructions: Apply to sacrum qshift & prn. Every Shift Day, Evening, Night   bictegravir-emtricitabine-tenofovir AF (BIKTARVY) 50-200-25 MG TABS tablet Take 1 tablet by mouth daily.   calcium carbonate (TUMS EX) 750 MG chewable tablet Chew 1 tablet by mouth 2 (two) times daily.   Cholecalciferol (VITAMIN D3) 2000 UNITS TABS Take 2,000 mg by mouth daily.   coal tar (NEUTROGENA T-GEL) 0.5 % shampoo Apply to scalp on shower days (Twice a week) for dandruff and dry scalp Wed., and Sat.   docusate sodium (COLACE) 100 MG capsule Take 100 mg by mouth daily.   Fluticasone-Salmeterol (ADVAIR) 100-50 MCG/DOSE AEPB Inhale 1 puff into the lungs daily.   metoprolol tartrate (LOPRESSOR) 100 MG tablet Take 100 mg by mouth 2 (two) times daily.   midodrine (PROAMATINE) 10 MG tablet Take 10 mg by mouth. Send medication with resident to be given at center. Once a day on Tues.,Thurs., Sat.   multivitamin (RENA-VIT) TABS tablet Take 1 tablet by mouth at bedtime.   NON FORMULARY Diet Type:  Dysphagia 2 with thin liquids (NO Straws)   Nutritional Supplements (FEEDING SUPPLEMENT, NEPRO CARB STEADY,) LIQD Take by mouth daily.   omeprazole (PRILOSEC) 20 MG capsule Take 20 mg by mouth at bedtime.   senna-docusate (SENEXON-S) 8.6-50 MG tablet Take 1 tablet by mouth daily.   UNABLE TO FIND FLUID RESTRICTION 1200 CC/ 24hrs. Breakfast (12oz) 360 ml lunch (8oz) 240 ml Dinner (12oz) 360 ml Every Shift Day, Evening, Night   warfarin (COUMADIN) 1 MG tablet Take 1 mg  by mouth daily. Give along with 2.5 mg tab for total of 3.5 daily   warfarin (COUMADIN) 2.5 MG tablet Take 2.5 mg by mouth daily. Give along with 1 mg tab for total of 3.5 mg daily   ZINC OXIDE EX Apply Zinc oxide to bilateral buttocks area for irritation. Every Shift Day, Day, Day   [DISCONTINUED] metoprolol succinate (TOPROL-XL) 25 MG 24 hr tablet Take 1 tablet (25 mg total) by mouth daily. (Patient taking differently: Take 25 mg by mouth 2 (two) times daily. )   [DISCONTINUED] tamsulosin (FLOMAX) 0.4 MG CAPS capsule Take 0.4 mg by mouth daily.   No facility-administered encounter medications on file as of 03/12/2019.      SIGNIFICANT DIAGNOSTIC EXAMS   PREVIOUS   07-07-18: US renal: Medical renal disease changes of both kidneys. Small renal cysts in both kidneys. No evidence of hydronephrosis.  07-11-18: ct of head:  1. Stable head CT, with no new acute intracranial abnormality. 2. In band cerebral atrophy with chronic small vessel ischemic disease with multiple chronic ischemic infarcts as above. 3. Acute pan sinusitis, worsened from previous.  05-12-19 ct of abdomen and pelvis: Diverticulosis without diverticulitis. Bilateral renal cysts. Mild bibasilar atelectasis.  03-02-19: ct of head:  1. No acute cortically based infarct or acute intracranial hemorrhage identified. 2. Stable non contrast CT appearance of advanced chronic ischemic disease since February. 3.  ASPECTS 10 (chronic encephalomalacia).  03-02-19: ct angio of head and neck CTA head: 1. No intracranial large vessel occlusion. 2. Moderate focal stenosis within the P2 left PCA. 3. Mild focal stenosis within the right PCA at the P1-P2 junction. 4. Additional intracranial atherosclerotic disease without significant stenosis as described. CTA neck: 1. Atherosclerotic disease within the imaged aortic arch and major branch vessels of the neck as detailed and most notably as follows. 2. Approximately 60-70%  stenosis of the proximal left ICA. 3. Less  than 50% stenosis of the proximal right ICA. 4. Mild/moderate ostial stenosis of the dominant left vertebral artery. The bilateral vertebral arteries are otherwise patent within the neck without significant stenosis. 5. Anasarca. Partially visualized bilateral pleural effusions. The right pleural effusion may be loculated. 6. Sequela of renal osteodystrophy.  03-03-19" 2-d echo 1. Left ventricular ejection fraction, by visual estimation, is 15-20%. The left ventricle has severely decreased function. Moderately increased left ventricular size. There is mildly increased left ventricular hypertrophy. There is global hypokinesis  with some regional variation.  2. Left ventricular diastolic Doppler parameters are consistent with impaired relaxation pattern of LV diastolic filling.  3. Global right ventricle has mildly reduced systolic function.The right ventricular size is normal. No increase in right ventricular wall thickness.  4. Left atrial size was mildly dilated.  5. Right atrial size was moderately dilated.  6. The mitral valve is grossly normal. Mild mitral valve regurgitation.  7. The tricuspid valve is grossly normal. Tricuspid valve regurgitation moderate.  8. The pulmonic valve was grossly normal. Pulmonic valve regurgitation is trivial by color flow Doppler.  9. Mildly elevated pulmonary artery systolic pressure. 10. A ICD wire is visualized. 11. The inferior vena cava is dilated in size with <50% respiratory variability, suggesting right atrial pressure of 15 mmHg. 12. Bioprosthesis in aortic position (unknown size and type). No perivalvular leak. Acoustic shadowing noted in association with leaflet motion - unable to completely exclude vegetation based on images. If clinically indicated and feasible, could  consider TEE. 13. Aortic valve regurgitation was not visualized by color flow Doppler. 14. Aortic valve mean gradient measures 10.7  mmHg. 15. The tricuspid regurgitant velocity is 2.17 m/s, and with an assumed right atrial pressure of 15 mmHg, the estimated right ventricular systolic pressure is mildly elevated at 33.8 mmHg.   03-05-19: EEG: This recording of awake and drowsy states shows mild global slowing indicating a mild global encephalopathy. However, no epileptiform activities are documented.   03-05-19; MRI of brain: No acute infarct Atrophy with extensive chronic ischemic changes.  03-07-19: ct of abdomen and pelvis:  1. Interval large amount of free peritoneal fluid in the abdomen and pelvis. 2. Interval diffuse subcutaneous edema. 3. Small to moderate-sized right pleural effusion and small left pleural effusion. 4. Mild increase in prominence of the interstitial markings, compatible with interstitial pulmonary edema. 5. Stable possible early changes of cirrhosis of the liver. 6. Colonic diverticulosis. 7. Bilateral L5 spondylolysis with minimal spondylolisthesis at the L5-S1 level. 8. 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 9. Stable cardiomegaly and dense calcific coronary artery and aortic atherosclerosis.  03/03/2019: paracentesis: Successful ultrasound-guided paracentesis yielding 3.9 liters of peritoneal fluid.  PREVIOUS   LABS REVIEWED PREVIOUS:   05-22-18: hgb a1c 6.5 chol 100 ldl 43; trig 170; hdl 23 07-04-18: wbc  3.7; hgb 14.0; hct 45.6; mcv 91.8; plt 124; glucose 100; bun 45; creat 2.44; k+ 4.7; na++ 139 BNP 679.0  07-06-18: wbc 1.9; hgb 15.2; hct 48.8; mcv 91.2 plt 107; glucose 136; bun 43; creat 3.01; k+ 5.9; na++ 136; ca 8.8 ast 70 alt 46; albumin 3.0 BNP 554.5   blood culture: no growth /e-coli  07-08-18: wbc 13.8; hgb 13.1; hct 41.1; mcv 93.2 plt 73 glucose 108 ;bun 74; creat 5.32; k+ 5.5; na++ 139; ca 7.5; ast 80 alt 117; albumin 2.3  07-09-18; tsh 3.098 07-17-18: wbc 3.1; hgb 1.8; hct 37.3; mcv 89.9 plt 103 glucose 114; bun 95; creat 6.00;  k+ 3.5; na++ 139; ca  6.4 mag 2.1 07-27-18: INR 2.3: coumadin 5 mg daily   07-30-18: wbc  6.8; hgb 10.7; hct 33.6; mcv 89.6; plt 94 glucose 90; bun 93; creat 7.47; k+ 4.2; na++ 135; ca 7.1 08-02-18: INR 1.9   08-15-18: HIV quant: <20 CD4: 360  08-16-18: INR 3.2  08-23-18: INR  1.7 09-04-18: hgb 7.4 hct 23.6  glucose 90; bun 16; create 2.64; k+ 2.8; na++ 138; ca 8.3 phos 3.0; albumin 2.9 iron 52; tibc 218; PTH 124 vit 69.3  09-06-18: INR 2.5 guaiac neg  09-07-18: glucose 99; bun 22; creat 3.24; k+ 3.4;na++ 137; ca 8.4  09-11-18 hgb 10.3; hct 33.9 glucose 83; bun 18; creat 2.48; k+ 3.8; na++ 140; ca 8.9 iron 43; tibc 225 PTH 115 vit D 44.6 09-20-18: INR 2.1 09-22-18: guaiac: neg  10-13-18: INR 2.4 10-19-18: INR 2.1 11-02-18: INR 2.2  11-16-18: INR 3.8 11-19-18: INR 2.2 11-26-18: INR 1.6  12-05-18: hgb a1c 6.1 12-10-18: INR 1.9 12-18-18: INR 2.2  01-01-19: INR 2.3  01-10-19: wbc 2.3; hgb 12.1; hct 37.5; mcv 96.9; plt 101; INR 3.0 01-15-19: wbc 2.0; hgb 12.8; hct 39.5; mcv 96.1; plt 100; glucose 126; bun 40; creat 5.45; k+ 3.8; na++ 136; ca 8.8; liver normal albumin 3.2; PSA 0.98; INR 3.9 01-22-19; INR 3.1  01-29-19: INR 2.7 02-12-19: INR 4.6  02-15-19: INR 2.9  02-26-19: INR 1.9  03-02-19: wbc 2.4; hgb 12.4; hct 36.3; mcv 92.6; plt 79; glucose 106; bun 19; creat 2.53; k+ 3.1; na++ 136; ca 8.3; ast 47 alt 45; alk phos 161; total bili 1.8; albumin 2.9; INR 2.0 03-03-19: chol 113; ldl 68; trig 71; hdl 31 03-04-19: wbc 2.7; hgb 12.7; hct 38.2; mcv 95.0 plt 79; glucose 80; bun 30; creat 3.96; k+ 4.0; na++ 138; ca 8.8; mag 2.1; phos 3.9 03/11/2019: wbc 2.7; hgb 11.5; hct 32.9; mcv 90.9 ;plt 68; glucose 120; bun 23; creat 3.41; k+ 3.4; na++ 134; ca 8.8   LABS REVIEWED TODAY;   03-12-19: INR: 2.2   Review of Systems  Unable to perform ROS: Dementia (unable to participate )     Physical Exam Constitutional:      General: He is not in acute distress.    Appearance: He is well-developed. He is not diaphoretic.  Neck:     Musculoskeletal: Neck  supple.     Thyroid: No thyromegaly.  Cardiovascular:     Rate and Rhythm: Normal rate. Rhythm irregular.     Pulses: Normal pulses.     Heart sounds: Normal heart sounds.     Comments: 2/6 ICD present  Pulmonary:     Effort: Pulmonary effort is normal. No respiratory distress.     Breath sounds: Normal breath sounds.  Abdominal:     General: Bowel sounds are normal. There is no distension.     Palpations: Abdomen is soft.     Tenderness: There is no abdominal tenderness.  Musculoskeletal:     Right lower leg: No edema.     Left lower leg: No edema.     Comments: Is able to move all extremities   Lymphadenopathy:     Cervical: No cervical adenopathy.  Skin:    General: Skin is warm and dry.     Comments: Left upper arm a/v fistula: + thrill + bruit       Neurological:     Mental Status: He is alert. Mental status is at baseline.  Psychiatric:  Mood and Affect: Mood normal.       ASSESSMENT/ PLAN:  TODAY;   1. Cerebrovascular accident (CVA) 2. PAF (paroxsymal atrial fibrillation) 3. S/p avr (aortic valve replacement) 4. Chronic anticoagulation   For INR 2.2 will continue coumadin 3.5 mg daily and will check INR on 03-26-19.      MD is aware of resident's narcotic use and is in agreement with current plan of care. We will attempt to wean resident as appropriate.  Ok Edwards NP [facility]  Contact [phone] Monday through Friday 8am- 5pm  After hours call [phone]

## 2019-03-13 ENCOUNTER — Non-Acute Institutional Stay (SKILLED_NURSING_FACILITY): Payer: Medicare HMO | Admitting: Internal Medicine

## 2019-03-13 ENCOUNTER — Encounter: Payer: Self-pay | Admitting: Internal Medicine

## 2019-03-13 DIAGNOSIS — Z952 Presence of prosthetic heart valve: Secondary | ICD-10-CM

## 2019-03-13 DIAGNOSIS — F0150 Vascular dementia without behavioral disturbance: Secondary | ICD-10-CM

## 2019-03-13 DIAGNOSIS — N401 Enlarged prostate with lower urinary tract symptoms: Secondary | ICD-10-CM

## 2019-03-13 DIAGNOSIS — R55 Syncope and collapse: Secondary | ICD-10-CM | POA: Diagnosis not present

## 2019-03-13 DIAGNOSIS — I951 Orthostatic hypotension: Secondary | ICD-10-CM | POA: Diagnosis not present

## 2019-03-13 DIAGNOSIS — Z992 Dependence on renal dialysis: Secondary | ICD-10-CM

## 2019-03-13 DIAGNOSIS — B20 Human immunodeficiency virus [HIV] disease: Secondary | ICD-10-CM

## 2019-03-13 DIAGNOSIS — I639 Cerebral infarction, unspecified: Secondary | ICD-10-CM

## 2019-03-13 DIAGNOSIS — R188 Other ascites: Secondary | ICD-10-CM

## 2019-03-13 DIAGNOSIS — J438 Other emphysema: Secondary | ICD-10-CM

## 2019-03-13 DIAGNOSIS — N186 End stage renal disease: Secondary | ICD-10-CM

## 2019-03-13 DIAGNOSIS — R338 Other retention of urine: Secondary | ICD-10-CM

## 2019-03-13 DIAGNOSIS — N138 Other obstructive and reflux uropathy: Secondary | ICD-10-CM

## 2019-03-13 LAB — CULTURE, BODY FLUID W GRAM STAIN -BOTTLE: Culture: NO GROWTH

## 2019-03-13 NOTE — Progress Notes (Signed)
: Provider:  [name]., MD Location:  [place] Room Number: 154-W Place of Service:  SNF ([phone])  PCP: [name], MD Patient Care Team: [name], MD as PCP - General (Internal Medicine) Gala Romney, Cristopher Estimable, MD as Consulting Physician (Gastroenterology) Nyoka Cowden Phylis Bougie, NP as Nurse Practitioner ([place]) [facility], [place] ([place])  Extended Emergency Contact Information Primary Emergency Contact: [facility] Address: [address], [place] [address] [place] Phone: [phone] Mobile Phone: [phone] Relation: Relative Secondary Emergency Contact: [facility] Address: [address] [place] [address] [place] Phone: [phone] Mobile Phone: [phone] Relation: Sister     Allergies: Patient has no known allergies.  Chief Complaint  Patient presents with  . Readmit To SNF    Readmission to [facility]    HPI: Patient is a 68 y.o. male with history of severe LV dysfunction with LVEF 15-20%, end-stage renal disease on hemodialysis TTS, ischemic cardiomyopathy with ICD, paroxysmal atrial fib on warfarin due to bioprosthetic aortic valve replacement, hypertension, HIV, COPD, diabetes mellitus type 2, who was found unresponsive after hemodialysis at [facility].  He was seen by teleneurologist who is concern for stroke, he was not a candidate for TPA given that he was therapeutic on warfarin patient.  Patient was admitted to [facility] from 10/3-9 where it was felt that patient may have had a syncopal episode due to severe LV dysfunction.  Because patient had abdominal distention and CT the abdomen demonstrated significant amount of fluid in the abdomen and patient underwent a therapeutic and diagnostic paracentesis.Marland Kitchen  Unfortunately patient does not appear to tolerate full sessions of hemodialysis on account  of hypotension and therefore retains fluid.  It appears that patient may have had a syncopal episode on account of hypotension due to CV severe LV dysfunction.  Patient is nearing end-of-life and would likely benefit from hospice/palliative consult soon and placement on comfort measures/hospice.  Patient is admitted to skilled nursing facility for palliative/hospice/comfort care.  While at skilled nursing facility patient will be followed for COPD treated with as needed albuterol and Advair, HIV treated with PIKTARVY and prosthetic aortic valve treated with Coumadin.  Past Medical History:  Diagnosis Date  . AICD (automatic cardioverter/defibrillator) present   . Alcoholism in remission ([place])   . Anemia   . Aortic insufficiency   . Arthritis   . At moderate risk for fall   . Bell's palsy   . Chronic systolic heart failure (HCC)    a. EF previously 15-20% with cath in 2014 showing no significant CAD b. EF 20-25% by echo in 06/2018  . CKD (chronic kidney disease), stage IV ([place])   . COPD (chronic obstructive pulmonary disease) ([place])   . Essential hypertension, benign   . HIV disease ([place]) 09/06/2016  . Hyperlipidemia   . NICM (nonischemic cardiomyopathy) ([place])   . Noncompliance with medication regimen   . NSVT (nonsustained ventricular tachycardia) ([place])   . Numbness of right jaw    Had a stoke in 01/2013. Numbness is occasional, especially when trying to chew.  . OSA (obstructive sleep apnea)   . Productive cough 08/2013   With brown sputum   . S/P aortic valve replacement with bioprosthetic valve   . Stroke ([place]) 01/2013   weakness of right side from CVA  .  Type 2 diabetes mellitus ([place])   . Type 2 diabetes mellitus with hypertension and end stage renal disease on dialysis ([place]) 07/31/2018  . Uses hearing aid 2014   recently received new hearing aids    Past Surgical History:  Procedure Laterality Date  . AV FISTULA PLACEMENT Left 07/20/2018   Procedure: INSERTION ARTERIOVENOUS GORTEX  GRAFT  LEFT ARM;  Surgeon: Rosetta Posner, MD;  Location: [place];  Service: Vascular;  Laterality: Left;  . BIOPSY N/A 04/10/2014   Procedure: GASTRIC BIOPSY;  Surgeon: Daneil Dolin, MD;  Location: [facility];  Service: Endoscopy;  Laterality: N/A;  . BIOPSY  10/12/2015   Procedure: BIOPSY;  Surgeon: Daneil Dolin, MD;  [facility];  Service: Endoscopy;;  stomach bx's  . CARDIAC DEFIBRILLATOR PLACEMENT  11/12/12   [employer] Inogen MINI ICD implanted in [place] at [place] [facility] per Dr Nelly Laurence note  . COLONOSCOPY WITH PROPOFOL N/A 04/10/2014   RMR: Colonic Diverticulosis  . ESOPHAGOGASTRODUODENOSCOPY (EGD) WITH PROPOFOL N/A 04/10/2014   RMR: Mild erosive reflux esophagitis. Multiple antral polyps likely hyperplastic status post removal by hot snare cautery technique. Diffusely abnormal stomach status post gastric biopsy I suspect some of patients anemaia may be due to intermittent oozing from the stomach. It would be difficult and a risky proposition to attempt complete removal of all of his gastric polyps.  . ESOPHAGOGASTRODUODENOSCOPY (EGD) WITH PROPOFOL N/A 10/12/2015   Procedure: ESOPHAGOGASTRODUODENOSCOPY (EGD) WITH PROPOFOL;  Surgeon: Daneil Dolin, MD;  [facility];  Service: Endoscopy;  Laterality: N/A;  [id number] - moved to 9:15  . INSERTION OF DIALYSIS CATHETER Right 07/20/2018   Procedure: EXCHANGE OF DIALYSIS CATHETER RIGHT INTERNAL JUGULAR;  Surgeon: Rosetta Posner, MD;  Location: [place];  Service: Vascular;  Laterality: Right;  . IR FLUORO GUIDE CV LINE RIGHT  07/09/2018  . IR US GUIDE VASC ACCESS RIGHT  07/09/2018  . POLYPECTOMY N/A 04/10/2014   Procedure: GASTRIC POLYPECTOMY;  Surgeon: Daneil Dolin, MD;  [facility];  Service: Endoscopy;  Laterality: N/A;  . RIGHT HEART CATHETERIZATION N/A 02/06/2014   Procedure: RIGHT HEART CATH;  Surgeon: Larey Dresser, MD;  Location: [facility] CATH LAB;  Service:  Cardiovascular;  Laterality: N/A;    Allergies as of 03/13/2019   No Known Allergies     Medication List    Notice   This visit is during an admission. Changes to the med list made in this visit will be reflected in the After Visit Summary of the admission.    Current Outpatient Medications on File Prior to Visit  Medication Sig Dispense Refill  . acetaminophen (TYLENOL) 325 MG tablet Take 2 tablets (650 mg total) by mouth every 4 (four) hours as needed for mild pain (or temp > 37.5 C (99.5 F)).    Marland Kitchen albuterol (PROVENTIL) (2.5 MG/3ML) 0.083% nebulizer solution Take 3 mLs (2.5 mg total) by nebulization every 4 (four) hours as needed for wheezing or shortness of breath. 75 mL 12  . atorvastatin (LIPITOR) 80 MG tablet Take 80 mg by mouth daily.     Roseanne Kaufman Peru-Castor Oil (VENELEX) OINT Ointment; - ; topical  Special Instructions: Apply to sacrum qshift & prn. Every Shift Day, Evening, Night    . bictegravir-emtricitabine-tenofovir AF (BIKTARVY) 50-200-25 MG TABS tablet Take 1 tablet by mouth daily. 30 tablet 11  . calcium carbonate (TUMS EX) 750 MG chewable tablet Chew 1 tablet  by mouth 2 (two) times daily.    . Cholecalciferol (VITAMIN D3) 2000 UNITS TABS Take 2,000 mg by mouth daily.    . coal tar (NEUTROGENA T-GEL) 0.5 % shampoo Apply to scalp on shower days (Twice a week) for dandruff and dry scalp Wed., and Sat.    . docusate sodium (COLACE) 100 MG capsule Take 100 mg by mouth daily.    . Fluticasone-Salmeterol (ADVAIR) 100-50 MCG/DOSE AEPB Inhale 1 puff into the lungs daily.    . metoprolol tartrate (LOPRESSOR) 100 MG tablet Take 100 mg by mouth 2 (two) times daily.    . midodrine (PROAMATINE) 10 MG tablet Take 10 mg by mouth. Send medication with resident to be given at center. Once a day on Tues.,Thurs., Sat.    . multivitamin (RENA-VIT) TABS tablet Take 1 tablet by mouth at bedtime.  0  . NON FORMULARY Diet Type:  Dysphagia 2 with thin liquids (NO Straws)    . Nutritional  Supplements (FEEDING SUPPLEMENT, NEPRO CARB STEADY,) LIQD Take by mouth daily.    Marland Kitchen omeprazole (PRILOSEC) 20 MG capsule Take 20 mg by mouth at bedtime.    . senna-docusate (SENEXON-S) 8.6-50 MG tablet Take 1 tablet by mouth daily.    Marland Kitchen UNABLE TO FIND FLUID RESTRICTION 1200 CC/ 24hrs. Breakfast (12oz) 360 ml lunch (8oz) 240 ml Dinner (12oz) 360 ml Every Shift Day, Evening, Night    . warfarin (COUMADIN) 1 MG tablet Take 1 mg by mouth daily. Give along with 2.5 mg tab for total of 3.5 daily    . warfarin (COUMADIN) 2.5 MG tablet Take 2.5 mg by mouth daily. Give along with 1 mg tab for total of 3.5 mg daily    . ZINC OXIDE EX Apply Zinc oxide to bilateral buttocks area for irritation. Every Shift Day, Day, Day     No current facility-administered medications on file prior to visit.      No orders of the defined types were placed in this encounter.   Immunization History  Administered Date(s) Administered  . Fluad Quad(high Dose 65+) 03/03/2019  . Hepatitis A, Adult 09/19/2016, 04/18/2018  . Hepatitis B, adult 09/19/2016, 10/05/2018, 11/08/2018, 12/11/2018  . Influenza,inj,Quad PF,6-35 Mos 02/16/2018  . Influenza-Unspecified 03/13/2015, 07/07/2016, 03/03/2017, 02/08/2018, 03/01/2018  . Pneumococcal Conjugate-13 03/03/2017  . Pneumococcal Polysaccharide-23 07/08/2016, 09/19/2016  . Pneumococcal-Unspecified 07/08/2016, 03/24/2017  . Tdap 02/23/2017    Social History   Tobacco Use  . Smoking status: Former Smoker    Types: Cigarettes    Quit date: 11/05/1993    Years since quitting: 25.3  . Smokeless tobacco: Never Used  Substance Use Topics  . Alcohol use: Not Currently    Comment: former heavy ETOH, none recently    Family history is   Family History  Problem Relation Age of Onset  . Kidney disease Mother   . Kidney disease Sister   . Colon cancer Neg Hx       Review of Systems  DATA OBTAINED: from nurse GENERAL:  no fevers, fatigue, appetite changes SKIN: No  itching, or rash EYES: No eye pain, redness, discharge EARS: No earache, tinnitus, change in hearing NOSE: No congestion, drainage or bleeding  MOUTH/THROAT: No mouth or tooth pain, No sore throat RESPIRATORY: No cough, wheezing, SOB CARDIAC: No chest pain, palpitations, lower extremity edema  GI: No abdominal pain, No N/V/D or constipation, No heartburn or reflux  GU: No dysuria, frequency or urgency, or incontinence  MUSCULOSKELETAL: No unrelieved bone/joint pain NEUROLOGIC: No headache, dizziness or  focal weakness PSYCHIATRIC: No c/o anxiety or sadness   Vitals:   03/13/19 1059  BP: 102/69  Pulse: 76  Resp: 18  Temp: 98.2 F (36.8 C)  SpO2: 94%    SpO2 Readings from Last 1 Encounters:  03/13/19 94%   Body mass index is 24.87 kg/m.     Physical Exam  GENERAL APPEARANCE: Alert, non-conversant,  No acute distress.  SKIN: No diaphoresis rash HEAD: Normocephalic, atraumatic  EYES: Conjunctiva/lids clear. Pupils round, reactive. EOMs intact.  EARS: External exam WNL, canals clear. Hearing grossly normal.  NOSE: No deformity or discharge.  MOUTH/THROAT: Lips w/o lesions  RESPIRATORY: Breathing is even, unlabored. Lung sounds are clear   CARDIOVASCULAR: Heart RRR no murmurs, rubs or gallops.  Trace peripheral edema.   GASTROINTESTINAL: Abdomen is soft, non-tender, +distended w/ normal bowel sounds.,  No fluid wave was detected GENITOURINARY: Bladder non tender, not distended  MUSCULOSKELETAL: No abnormal joints or musculature NEUROLOGIC:  Cranial nerves 2-12 grossly intact. Moves all extremities  PSYCHIATRIC: Mood and affect flat, no behavioral issues  Patient Active Problem List   Diagnosis Date Noted  . NSVT (nonsustained ventricular tachycardia) ([place])   . At moderate risk for fall   . Bell's palsy   . Vascular dementia without behavioral disturbance ([place]) 02/09/2019  . Chronic anticoagulation 12/11/2018  . Hypertension associated with stage 5 chronic kidney  disease due to type 2 diabetes mellitus ([place]) 12/04/2018  . S/P AVR (aortic valve replacement) 12/04/2018  . Anemia due to end stage renal disease ([place]) 10/02/2018  . ESRD (end stage renal disease) on dialysis ([place]) 08/06/2018  . PAF (paroxysmal atrial fibrillation) ([place]) 07/31/2018  . Dyslipidemia associated with type 2 diabetes mellitus ([place]) 07/31/2018  . Type 2 diabetes mellitus with hypertension and end stage renal disease on dialysis ([place]) 07/31/2018  . Chronic kidney disease with end stage renal disease on dialysis due to type 2 diabetes mellitus ([place]) 07/31/2018  . Dialysis patient ([place]) 07/31/2018  . BPH with obstruction/lower urinary tract symptoms 07/31/2018  . GERD without esophagitis 07/31/2018  . Chronic constipation 07/31/2018  . 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 Malnutrition of moderate degree 07/20/2018  . Diverticulosis   . Tachycardia   . E coli bacteremia   . Septic shock ([place]) 07/07/2018  . Chronic combined systolic and diastolic heart failure ([place]) 12/09/2016  . COPD (chronic obstructive pulmonary disease) ([place]) 09/12/2016  . Protein-calorie malnutrition, severe 09/06/2016  . HIV disease ([place]) 09/06/2016  . CVA (cerebral vascular accident) ([place]) 06/26/2016  . ICD (implantable cardioverter-defibrillator) in place 01/22/2015  . Anemia in chronic kidney disease 03/26/2014  . Carotid bruit 12/18/2013  . Essential hypertension, benign 10/23/2013  . CAD (coronary artery disease) 10/23/2013      Labs reviewed: Basic Metabolic Panel:    Component Value Date/Time   NA 133 (L) 03/12/2019 0615   K 3.8 03/12/2019 0615   CL 93 (L) 03/12/2019 0615   CO2 27 03/12/2019 0615   GLUCOSE 100 (H) 03/12/2019 0615   BUN 37 (H) 03/12/2019 0615   CREATININE 5.01 (H) 03/12/2019 0615   CREATININE 2.33 (H) 07/09/2014 1145   CALCIUM 8.6 (L) 03/12/2019 0615   CALCIUM 9.0 09/11/2018 0700   PROT 6.5 03/12/2019 0615   ALBUMIN 2.9 (L) 03/12/2019 0615   AST 38 03/12/2019  0615   ALT 33 03/12/2019 0615   ALKPHOS 128 (H) 03/12/2019 0615   BILITOT 1.5 (H) 03/12/2019 0615   GFRNONAA 11 (L) 03/12/2019 0615   GFRAA 13 (L)  03/12/2019 0615    Recent Labs    07/17/18 0416 07/18/18 0341  03/04/19 0511 03/05/19 0506 03/06/19 0621 03/07/19 0714 03/27/2019 0558 03/12/19 0615  NA 139 140   < > 138 138 135 135 134* 133*  K 3.5 3.7   < > 4.0 3.8 4.8 4.0 3.4* 3.8  CL 103 105   < > 100 101 96* 97* 96* 93*  CO2 23 25   < > 24 26 25 27 25 27   GLUCOSE 114* 93   < > 80 115* 191* 110* 120* 100*  BUN 95* 48*   < > 30* 37* 27* 36* 23 37*  CREATININE 6.00* 3.74*   < > 3.96* 4.74* 3.73* 4.68* 3.41* 5.01*  CALCIUM 6.4* 7.0*   < > 8.8* 8.8* 8.6* 8.8* 8.8* 8.6*  MG 2.1 1.9  --  2.1  --   --   --   --   --   PHOS  --   --    < > 3.9 4.1 3.1  --   --   --    < > = values in this interval not displayed.   Liver Function Tests: Recent Labs    03/02/19 1534 03/03/19 0600 03/05/19 0506 03/06/19 0621 03/12/19 0615  AST 47* 42*  --   --  38  ALT 45* 40  --   --  33  ALKPHOS 161* 150*  --   --  128*  BILITOT 1.8* 2.1*  --   --  1.5*  PROT 7.0 6.6  --   --  6.5  ALBUMIN 2.9* 2.9* 2.8* 2.9* 2.9*   No results for input(s): LIPASE, AMYLASE in the last 8760 hours. Recent Labs    07/08/18 1204  AMMONIA 33   CBC: Recent Labs    01/10/19 1630  03/02/19 1534  03/06/19 0621 03/07/19 0714 03/19/2019 0558  WBC 2.3*   < > 2.4*   < > 2.8* 2.3* 2.7*  NEUTROABS 1.4*  --  1.6*  --  2.0  --   --   HGB 12.1*   < > 12.4*   < > 11.5* 12.1* 11.5*  HCT 37.5*   < > 36.3*   < > 34.5* 35.5* 32.9*  MCV 96.9   < > 92.6   < > 93.8 93.9 90.9  PLT 101*   < > 79*   < > 72* 73* 68*   < > = values in this interval not displayed.   Lipid Recent Labs    05/22/18 0700 03/03/19 0600  CHOL 100 113  HDL 23* 31*  LDLCALC 43 68  TRIG 170* 71    Cardiac Enzymes: No results for input(s): CKTOTAL, CKMB, CKMBINDEX, TROPONINI in the last 8760 hours. BNP: Recent Labs    07/04/18 0700  07/06/18 1918  BNP 697.0* 554.5*   No results found for: [facility] Lab Results  Component Value Date   HGBA1C 6.1 (H) 12/05/2018   Lab Results  Component Value Date   TSH 3.098 07/09/2018   Lab Results  Component Value Date   VITAMINB12 1,031 (H) 01/19/2018   Lab Results  Component Value Date   FOLATE 36.5 01/19/2018   Lab Results  Component Value Date   IRON 43 (L) 09/11/2018   TIBC 225 (L) 09/11/2018   FERRITIN 39 04/17/2018    Imaging and Procedures obtained prior to SNF admission: No results found.   Not all labs, radiology exams or other studies done during hospitalization come through on  my EPIC note; however they are reviewed by me.    Assessment and Plan  Syncopal episode/end-stage congestive heart failure/orthostatic hypotension -suspected to be secondary to severe LV dysfunction; MRI brain is no acute disease; ejection fraction 15 to 20%; patient cannot tolerate full session of hemodialysis secondary to hypovolemia and secondary to his LV dysfunction he retains fluid; patient is nearing end-of-life and would benefit from hospice palliativ SNF-admitted for palliative/hospice/comfort care continue Toprol-XL 25 mg daily and midodrine 10 mg on dialysis days Tuesday Thursday Saturday  ascites-noted on CT of the abdomen pelvis for abdominal distention; it appears that much of this fluid is present on account of severe LV dysfunction with a EF of 10 to 15% as well as end-stage renal disease.  Patient underwent a therapeutic and diagnostic paracentesis SNF-we will monitor abdomen for possible further need for paracentesis  Acute urinary retention-Foley was placed and removed appear resolved  COPD SNF-continue Advair 1 puff daily and as needed albuterol  HIV SNF-continue BSC TAR VY 50-200-25 1 p.o. daily  Prosthetic aortic valve SNF-continue Coumadin 5 mg daily with the exception of Monday and Wednesday which patient is 2.5 mg daily; will actively titrate   Hyperlipidemia SNF-not stated as uncontrolled; continue Lipitor 80 mg daily  Vascular dementia SNF-continue supportive care   Time spent greater than 45 minutes;.g  [name], MD

## 2019-03-17 ENCOUNTER — Encounter: Payer: Self-pay | Admitting: Internal Medicine

## 2019-03-17 DIAGNOSIS — R338 Other retention of urine: Secondary | ICD-10-CM | POA: Insufficient documentation

## 2019-03-17 DIAGNOSIS — R188 Other ascites: Secondary | ICD-10-CM | POA: Insufficient documentation

## 2019-03-17 DIAGNOSIS — R55 Syncope and collapse: Secondary | ICD-10-CM | POA: Insufficient documentation

## 2019-03-17 DIAGNOSIS — I951 Orthostatic hypotension: Secondary | ICD-10-CM | POA: Insufficient documentation

## 2019-03-19 ENCOUNTER — Other Ambulatory Visit: Payer: Self-pay | Admitting: Adult Health

## 2019-03-19 ENCOUNTER — Non-Acute Institutional Stay (SKILLED_NURSING_FACILITY): Payer: Medicare HMO | Admitting: Adult Health

## 2019-03-19 ENCOUNTER — Encounter: Payer: Self-pay | Admitting: Adult Health

## 2019-03-19 DIAGNOSIS — I480 Paroxysmal atrial fibrillation: Secondary | ICD-10-CM

## 2019-03-19 DIAGNOSIS — N185 Chronic kidney disease, stage 5: Secondary | ICD-10-CM

## 2019-03-19 DIAGNOSIS — I120 Hypertensive chronic kidney disease with stage 5 chronic kidney disease or end stage renal disease: Secondary | ICD-10-CM | POA: Diagnosis not present

## 2019-03-19 DIAGNOSIS — E1122 Type 2 diabetes mellitus with diabetic chronic kidney disease: Secondary | ICD-10-CM | POA: Diagnosis not present

## 2019-03-19 DIAGNOSIS — I5042 Chronic combined systolic (congestive) and diastolic (congestive) heart failure: Secondary | ICD-10-CM

## 2019-03-19 NOTE — Progress Notes (Signed)
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 PAF (paroxsymal atrial fibrillation)   Weekly follow up for the first 30 days post hospitalization.      HPI:  He is a 68 year old long term resident of this facility being seen for the management of his chronic illnesses; chf; hypertension; paf. There are no reports of changes in appetite; no shortness of breath; no uncontrolled pain.   Past Medical History:  Diagnosis Date   AICD (automatic cardioverter/defibrillator) present    Alcoholism in remission ([place])    Anemia    Aortic insufficiency    Arthritis    At moderate risk for fall    Bell's palsy    Chronic systolic heart failure ([place])    a. EF previously 15-20% with cath in 2014 showing no significant CAD b. EF 20-25% by echo in 06/2018   CKD (chronic kidney disease), stage IV (HCC)    COPD (chronic obstructive pulmonary disease) ([place])    Essential hypertension, benign    HIV disease ([place]) 09/06/2016   Hyperlipidemia    NICM (nonischemic cardiomyopathy) ([place])    Noncompliance with medication regimen    NSVT (nonsustained ventricular tachycardia) (HCC)    Numbness of right jaw    Had a stoke in 01/2013. Numbness is occasional, especially when trying to chew.   OSA (obstructive sleep apnea)    Productive cough 08/2013   With brown sputum    S/P aortic valve replacement with bioprosthetic valve    Stroke ([place]) 01/2013   weakness of right side from CVA   Type 2 diabetes mellitus ([place])    Type 2 diabetes mellitus with hypertension and end stage renal disease on dialysis ([place]) 07/31/2018   Uses hearing aid 2014   recently received new hearing aids    Past Surgical History:  Procedure Laterality Date   AV FISTULA PLACEMENT Left 07/20/2018   Procedure: INSERTION ARTERIOVENOUS GORTEX GRAFT  LEFT ARM;  Surgeon: Rosetta Posner, MD;  Location: [facility];  Service: Vascular;  Laterality: Left;   BIOPSY N/A 04/10/2014   Procedure: GASTRIC BIOPSY;  Surgeon: Daneil Dolin, MD;  Location: [facility];  Service: Endoscopy;  Laterality: N/A;   BIOPSY  10/12/2015   Procedure: BIOPSY;  Surgeon: Daneil Dolin, MD;  [facility];  Service: Endoscopy;;  stomach bx's   CARDIAC DEFIBRILLATOR PLACEMENT  11/12/12   [employer] Inogen MINI ICD implanted in [place] at [place] [facility] per Dr Nelly Laurence note   COLONOSCOPY WITH PROPOFOL N/A 04/10/2014   RMR: Colonic Diverticulosis   ESOPHAGOGASTRODUODENOSCOPY (EGD) WITH PROPOFOL N/A 04/10/2014   RMR: Mild erosive reflux esophagitis. Multiple antral polyps likely hyperplastic status post removal by hot snare cautery technique. Diffusely abnormal stomach status post gastric biopsy I suspect some of patients anemaia may be due to intermittent oozing from the stomach. It would be difficult and a risky proposition to attempt complete removal of all of his gastric polyps.   ESOPHAGOGASTRODUODENOSCOPY (EGD) WITH PROPOFOL N/A 10/12/2015   Procedure: ESOPHAGOGASTRODUODENOSCOPY (EGD) WITH PROPOFOL;  Surgeon: Herbie Baltimore  Hilton Cork, MD;  [facility];  Service: Endoscopy;  Laterality: N/A;  [id number] - moved to [place] Right 07/20/2018   Procedure: EXCHANGE OF DIALYSIS CATHETER RIGHT INTERNAL JUGULAR;  Surgeon: Rosetta Posner, MD;  Location: [facility];  Service: Vascular;  Laterality: Right;   IR FLUORO GUIDE CV LINE RIGHT  07/09/2018   IR US GUIDE VASC ACCESS RIGHT  07/09/2018   POLYPECTOMY N/A 04/10/2014   Procedure: GASTRIC POLYPECTOMY;  Surgeon: Daneil Dolin, MD;  Location: [facility];  Service:  Endoscopy;  Laterality: N/A;   RIGHT HEART CATHETERIZATION N/A 02/06/2014   Procedure: RIGHT HEART CATH;  Surgeon: Larey Dresser, MD;  Location: [facility] CATH LAB;  Service: Cardiovascular;  Laterality: N/A;    Social History   Socioeconomic History   Marital status: Single    Spouse name: Not on file   Number of children: Not on file   Years of education: Not on file   Highest education level: Not on file  Occupational History   Occupation: disabled  Social Designer, fashion/clothing strain: Not hard at all   Food insecurity    Worry: Never true    Inability: Never true   Transportation needs    Medical: No    Non-medical: No  Tobacco Use   Smoking status: Former Smoker    Types: Cigarettes    Quit date: 11/05/1993    Years since quitting: 25.3   Smokeless tobacco: Never Used  Substance and Sexual Activity   Alcohol use: Not Currently    Comment: former heavy ETOH, none recently   Drug use: No    Comment: prior history of cocaine use, smoking    Sexual activity: Yes    Birth control/protection: None  Lifestyle   Physical activity    Days per week: 0 days    Minutes per session: 0 min   Stress: Not at all  Relationships   Social connections    Talks on phone: Never    Gets together: Once a week    Attends religious service: Never    Active member of club or organization: No    Attends meetings of clubs or organizations: Never    Relationship status: Never married   Intimate partner violence    Fear of current or ex partner: No    Emotionally abused: No    Physically abused: No    Forced sexual activity: No  Other Topics Concern   Not on file  Social History Narrative    Has h/o polysubstance abuse.  Baseline    2019- resident of [facility]. Unable to ambulate unassisted.            Family History  Problem Relation Age of Onset   Kidney disease Mother    Kidney disease Sister    Colon cancer Neg Hx       VITAL SIGNS BP  116/71    Pulse (!) 58    Temp (!) 97.3 F (36.3 C) (Oral)    Resp 20    Ht '5\' 3"'  (1.6 m)    Wt 140 lb 6.4 oz (63.7 kg)    SpO2 94%    BMI 24.87 kg/m   Outpatient Encounter Medications as of 03/19/2019  Medication Sig   acetaminophen (TYLENOL) 325 MG tablet Take 2 tablets (650 mg total) by mouth every 4 (four) hours as needed for mild pain (or temp > 37.5 C (99.5 F)).  albuterol (PROVENTIL) (2.5 MG/3ML) 0.083% nebulizer solution Take 3 mLs (2.5 mg total) by nebulization every 4 (four) hours as needed for wheezing or shortness of breath.   atorvastatin (LIPITOR) 80 MG tablet Take 80 mg by mouth daily.    Balsam [place]-Castor Oil (VENELEX) OINT Ointment; - ; topical  Special Instructions: Apply to sacrum qshift & prn. Every Shift Day, Evening, Night   bictegravir-emtricitabine-tenofovir AF (BIKTARVY) 50-200-25 MG TABS tablet Take 1 tablet by mouth daily.   calcium carbonate (TUMS EX) 750 MG chewable tablet Chew 1 tablet by mouth 2 (two) times daily.   Cholecalciferol (VITAMIN D3) 2000 UNITS TABS Take 2,000 mg by mouth daily.   coal tar (NEUTROGENA T-GEL) 0.5 % shampoo Apply to scalp on shower days (Twice a week) for dandruff and dry scalp Wed., and Sat.   docusate sodium (COLACE) 100 MG capsule Take 100 mg by mouth daily.   Fluticasone-Salmeterol (ADVAIR) 100-50 MCG/DOSE AEPB Inhale 1 puff into the lungs daily.   metoprolol tartrate (LOPRESSOR) 100 MG tablet Take 100 mg by mouth 2 (two) times daily.   midodrine (PROAMATINE) 10 MG tablet Take 10 mg by mouth. Send medication with resident to be given at center. Once a day on Tues.,Thurs., Sat.   multivitamin (RENA-VIT) TABS tablet Take 1 tablet by mouth at bedtime.   NON FORMULARY Diet Type:  Dysphagia 2 with thin liquids (NO Straws)   Nutritional Supplements (FEEDING SUPPLEMENT, NEPRO CARB STEADY,) LIQD Take by mouth daily.   omeprazole (PRILOSEC) 20 MG capsule Take 20 mg by mouth at bedtime.   senna-docusate (SENEXON-S) 8.6-50  MG tablet Take 1 tablet by mouth daily.   UNABLE TO FIND FLUID RESTRICTION 1200 CC/ 24hrs. Breakfast (12oz) 360 ml lunch (8oz) 240 ml Dinner (12oz) 360 ml Every Shift Day, Evening, Night   warfarin (COUMADIN) 1 MG tablet Take 1 mg by mouth daily. Give along with 2.5 mg tab for total of 3.5 daily   warfarin (COUMADIN) 2.5 MG tablet Take 2.5 mg by mouth daily. Give along with 1 mg tab for total of 3.5 mg daily   ZINC OXIDE EX Apply Zinc oxide to bilateral buttocks area for irritation. Every Shift Day, Day, Day   No facility-administered encounter medications on file as of 03/19/2019.      SIGNIFICANT DIAGNOSTIC EXAMS   PREVIOUS   07-07-18: US renal: Medical renal disease changes of both kidneys. Small renal cysts in both kidneys. No evidence of hydronephrosis.  07-11-18: ct of head:  1. Stable head CT, with no new acute intracranial abnormality. 2. In band cerebral atrophy with chronic small vessel ischemic disease with multiple chronic ischemic infarcts as above. 3. Acute pan sinusitis, worsened from previous.  05-12-19 ct of abdomen and pelvis: Diverticulosis without diverticulitis. Bilateral renal cysts. Mild bibasilar atelectasis.  03-02-19: ct of head:  1. No acute cortically based infarct or acute intracranial hemorrhage identified. 2. Stable non contrast CT appearance of advanced chronic ischemic disease since February. 3.  ASPECTS 10 (chronic encephalomalacia).  03-02-19: ct angio of head and neck CTA head: 1. No intracranial large vessel occlusion. 2. Moderate focal stenosis within the P2 left PCA. 3. Mild focal stenosis within the right PCA at the P1-P2 junction. 4. Additional intracranial atherosclerotic disease without significant stenosis as described. CTA neck: 1. Atherosclerotic disease within the imaged aortic arch and major branch vessels of the neck as detailed and most notably as follows. 2. Approximately 60-70% stenosis of the proximal left ICA. 3.  Less than 50% stenosis of  the proximal right ICA. 4. Mild/moderate ostial stenosis of the dominant left vertebral artery. The bilateral vertebral arteries are otherwise patent within the neck without significant stenosis. 5. Anasarca. Partially visualized bilateral pleural effusions. The right pleural effusion may be loculated. 6. Sequela of renal osteodystrophy.  03-03-19" 2-d echo 1. Left ventricular ejection fraction, by visual estimation, is 15-20%. The left ventricle has severely decreased function. Moderately increased left ventricular size. There is mildly increased left ventricular hypertrophy. There is global hypokinesis  with some regional variation.  2. Left ventricular diastolic Doppler parameters are consistent with impaired relaxation pattern of LV diastolic filling.  3. Global right ventricle has mildly reduced systolic function.The right ventricular size is normal. No increase in right ventricular wall thickness.  4. Left atrial size was mildly dilated.  5. Right atrial size was moderately dilated.  6. The mitral valve is grossly normal. Mild mitral valve regurgitation.  7. The tricuspid valve is grossly normal. Tricuspid valve regurgitation moderate.  8. The pulmonic valve was grossly normal. Pulmonic valve regurgitation is trivial by color flow Doppler.  9. Mildly elevated pulmonary artery systolic pressure. 10. A ICD wire is visualized. 11. The inferior vena cava is dilated in size with <50% respiratory variability, suggesting right atrial pressure of 15 mmHg. 12. Bioprosthesis in aortic position (unknown size and type). No perivalvular leak. Acoustic shadowing noted in association with leaflet motion - unable to completely exclude vegetation based on images. If clinically indicated and feasible, could  consider TEE. 13. Aortic valve regurgitation was not visualized by color flow Doppler. 14. Aortic valve mean gradient measures 10.7 mmHg. 15. The tricuspid regurgitant velocity  is 2.17 m/s, and with an assumed right atrial pressure of 15 mmHg, the estimated right ventricular systolic pressure is mildly elevated at 33.8 mmHg.   03-05-19: EEG: This recording of awake and drowsy states shows mild global slowing indicating a mild global encephalopathy. However, no epileptiform activities are documented.   03-05-19; MRI of brain: No acute infarct Atrophy with extensive chronic ischemic changes.  03-07-19: ct of abdomen and pelvis:  1. Interval large amount of free peritoneal fluid in the abdomen and pelvis. 2. Interval diffuse subcutaneous edema. 3. Small to moderate-sized right pleural effusion and small left pleural effusion. 4. Mild increase in prominence of the interstitial markings, compatible with interstitial pulmonary edema. 5. Stable possible early changes of cirrhosis of the liver. 6. Colonic diverticulosis. 7. Bilateral L5 spondylolysis with minimal spondylolisthesis at the L5-S1 level. 8. 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 9. Stable cardiomegaly and dense calcific coronary artery and aortic atherosclerosis.  03/02/2019: paracentesis: Successful ultrasound-guided paracentesis yielding 3.9 liters of peritoneal fluid.  PREVIOUS   LABS REVIEWED PREVIOUS:   05-22-18: hgb a1c 6.5 chol 100 ldl 43; trig 170; hdl 23 07-04-18: wbc  3.7; hgb 14.0; hct 45.6; mcv 91.8; plt 124; glucose 100; bun 45; creat 2.44; k+ 4.7; na++ 139 BNP 679.0  07-06-18: wbc 1.9; hgb 15.2; hct 48.8; mcv 91.2 plt 107; glucose 136; bun 43; creat 3.01; k+ 5.9; na++ 136; ca 8.8 ast 70 alt 46; albumin 3.0 BNP 554.5   blood culture: no growth /e-coli  07-08-18: wbc 13.8; hgb 13.1; hct 41.1; mcv 93.2 plt 73 glucose 108 ;bun 74; creat 5.32; k+ 5.5; na++ 139; ca 7.5; ast 80 alt 117; albumin 2.3  07-09-18; tsh 3.098 07-17-18: wbc 3.1; hgb 1.8; hct 37.3; mcv 89.9 plt 103 glucose 114; bun 95; creat 6.00; k+ 3.5; na++ 139;  ca 6.4 mag 2.1 07-27-18: INR 2.3: coumadin 5 mg  daily   07-30-18: wbc  6.8; hgb 10.7; hct 33.6; mcv 89.6; plt 94 glucose 90; bun 93; creat 7.47; k+ 4.2; na++ 135; ca 7.1 08-02-18: INR 1.9   08-15-18: HIV quant: <20 CD4: 360  08-16-18: INR 3.2  08-23-18: INR  1.7 09-04-18: hgb 7.4 hct 23.6  glucose 90; bun 16; create 2.64; k+ 2.8; na++ 138; ca 8.3 phos 3.0; albumin 2.9 iron 52; tibc 218; PTH 124 vit 69.3  09-06-18: INR 2.5 guaiac neg  09-07-18: glucose 99; bun 22; creat 3.24; k+ 3.4;na++ 137; ca 8.4  09-11-18 hgb 10.3; hct 33.9 glucose 83; bun 18; creat 2.48; k+ 3.8; na++ 140; ca 8.9 iron 43; tibc 225 PTH 115 vit D 44.6 09-20-18: INR 2.1 09-22-18: guaiac: neg  10-13-18: INR 2.4 10-19-18: INR 2.1 11-02-18: INR 2.2  11-16-18: INR 3.8 11-19-18: INR 2.2 11-26-18: INR 1.6  12-05-18: hgb a1c 6.1 12-10-18: INR 1.9 12-18-18: INR 2.2  01-01-19: INR 2.3  01-10-19: wbc 2.3; hgb 12.1; hct 37.5; mcv 96.9; plt 101; INR 3.0 01-15-19: wbc 2.0; hgb 12.8; hct 39.5; mcv 96.1; plt 100; glucose 126; bun 40; creat 5.45; k+ 3.8; na++ 136; ca 8.8; liver normal albumin 3.2; PSA 0.98; INR 3.9 01-22-19; INR 3.1  01-29-19: INR 2.7 02-12-19: INR 4.6  02-15-19: INR 2.9  02-26-19: INR 1.9  03-02-19: wbc 2.4; hgb 12.4; hct 36.3; mcv 92.6; plt 79; glucose 106; bun 19; creat 2.53; k+ 3.1; na++ 136; ca 8.3; ast 47 alt 45; alk phos 161; total bili 1.8; albumin 2.9; INR 2.0 03-03-19: chol 113; ldl 68; trig 71; hdl 31 03-04-19: wbc 2.7; hgb 12.7; hct 38.2; mcv 95.0 plt 79; glucose 80; bun 30; creat 3.96; k+ 4.0; na++ 138; ca 8.8; mag 2.1; phos 3.9 03/29/2019: wbc 2.7; hgb 11.5; hct 32.9; mcv 90.9 ;plt 68; glucose 120; bun 23; creat 3.41; k+ 3.4; na++ 134; ca 8.8   LABS REVIEWED TODAY;   03-12-19: glucose 100 bun 37; creat 5.01 ;k+ 3.8; na++ 133 ca 8.6; total bili 1.3 albumin 2.9 INR: 2.2     Review of Systems  Unable to perform ROS: Dementia (unable to participate )     Physical Exam Constitutional:      General: He is not in acute distress.    Appearance: He is well-developed. He is not  diaphoretic.  Neck:     Musculoskeletal: Neck supple.     Thyroid: No thyromegaly.  Cardiovascular:     Rate and Rhythm: Normal rate. Rhythm irregular.     Pulses: Normal pulses.     Heart sounds: Murmur present.     Comments: 2/6 ICD present  Pulmonary:     Effort: Pulmonary effort is normal. No respiratory distress.     Breath sounds: Normal breath sounds.  Abdominal:     General: Bowel sounds are normal. There is no distension.     Palpations: Abdomen is soft.     Tenderness: There is no abdominal tenderness.  Musculoskeletal:     Right lower leg: No edema.     Left lower leg: No edema.     Comments: Able to move all extremities   Lymphadenopathy:     Cervical: No cervical adenopathy.  Skin:    General: Skin is warm and dry.     Comments:  Left upper arm a/v fistula: + thrill + bruit        Neurological:     Mental Status: He  is alert. Mental status is at baseline.  Psychiatric:        Mood and Affect: Mood normal.       ASSESSMENT/ PLAN:  TODAY:   1. 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 Hypertension associated with stage 5 chronic kidney disease due to type 2 diabetes mellitus: is stable b/p 116/71 will continue toprol xl 25 mg daily   3. PAF (paroxsymal atrial fibrillation) status post bioprosthetic avr: heart rate is stable will continue toprol xl 25 mg daily for rate control is on chronic coumadin therapy   PREVIOUS  4. Dyslipidemia associated with type 2 diabetes mellitus: is stable LDL 68 will continue lipitor 80 mg daily  5. 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 Chronic kidney disease with end stage renal disease on dialysis  due to type 2 diabetes mellitus/dialysis dependent: no change in status; is dialyzed three times weekly; followed by nephrology. Is on 1200 cc fluid restriction; midodrine 10 mg with hemodialysis  7.  Other emphysema is stable will continue albuterol neb every 4 hours as needed will continue advair 100/50 1 puff daily   8. HIV disease without change will continue bikatarvy 20-200-25 mg daily   9. CVA/Multiple CVA; is neurologically stable is on chronic coumadin therapy.   10. Anemia due to end stage renal disease is stable hgb 11.5 will monitor  11. 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 Protein calorie malnutrition severe; stable albumin 2.9 will continue supplements as directed weight is 140 pounds.   13. PBH with obstruction/urinary tract symptoms: is stable will continue flomax 0.4 mg daily   14. GERD without esophagitis: is stable will continue prilosec 20 mg daily   15. Chronic constipation: is stable will continue colace and senna s daily   16. 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   17. Vascular dementia without behavioral disturbance: is without change weight is 140 pounds will monitor   18. Syncope and collapse: is stable; more than likely due to EF. Will monitor his status.    MD is aware of resident's narcotic use and is in agreement with current plan of care. We will attempt to wean resident as appropriate.  Ok Edwards NP [facility]  Contact [phone] Monday through Friday 8am- 5pm  After hours call [phone]

## 2019-03-20 ENCOUNTER — Encounter: Payer: Self-pay | Admitting: Internal Medicine

## 2019-03-20 ENCOUNTER — Other Ambulatory Visit: Payer: Self-pay | Admitting: Adult Health

## 2019-03-20 ENCOUNTER — Ambulatory Visit (INDEPENDENT_AMBULATORY_CARE_PROVIDER_SITE_OTHER): Payer: Medicare HMO | Admitting: Internal Medicine

## 2019-03-20 VITALS — BP 97/64 | HR 63

## 2019-03-20 DIAGNOSIS — Z113 Encounter for screening for infections with a predominantly sexual mode of transmission: Secondary | ICD-10-CM

## 2019-03-20 DIAGNOSIS — E1122 Type 2 diabetes mellitus with diabetic chronic kidney disease: Secondary | ICD-10-CM

## 2019-03-20 DIAGNOSIS — B20 Human immunodeficiency virus [HIV] disease: Secondary | ICD-10-CM | POA: Diagnosis not present

## 2019-03-20 DIAGNOSIS — Z992 Dependence on renal dialysis: Secondary | ICD-10-CM

## 2019-03-20 DIAGNOSIS — N186 End stage renal disease: Secondary | ICD-10-CM

## 2019-03-20 NOTE — Progress Notes (Signed)
Patient ID: Keith Young, male   DOB: 10-28-1950, 68 y.o.   MRN: [id number]   Subjective:    Patient ID: Keith Young, male    DOB: February 10, 1951, 68 y.o.   MRN: [id number]  HPI Here for follow up of HIV He continues on Biktarvy and provided by the [facility] center and no missed doses.  No new issues.  Patient not interactive.     Review of Systems  Gastrointestinal: Negative for diarrhea.  Skin: Negative for rash.       Objective:   Physical Exam  Constitutional:  In wheelchair, no acute distress  Eyes: No scleral icterus.  Cardiovascular: Normal rate, regular rhythm and normal heart sounds.  Pulmonary/Chest: Effort normal and breath sounds normal. No respiratory distress.  Musculoskeletal:        General: No edema.  Neurological: He is alert.  Not oriented  Skin: No rash noted.   SH: lives at the [facility]:

## 2019-03-20 NOTE — Assessment & Plan Note (Signed)
Doing well.  Labs today and can rtc 1 year

## 2019-03-20 NOTE — Assessment & Plan Note (Signed)
On  Dialysis.  Ok with [employer], no changes.

## 2019-03-21 LAB — T-HELPER CELL (CD4) - (RCID CLINIC ONLY)
CD4 % Helper T Cell: 39 % (ref 33–65)
CD4 T Cell Abs: 135 /uL — ABNORMAL LOW (ref 400–1790)

## 2019-03-25 ENCOUNTER — Encounter: Payer: Self-pay | Admitting: Adult Health

## 2019-03-25 ENCOUNTER — Non-Acute Institutional Stay (SKILLED_NURSING_FACILITY): Payer: Medicare HMO | Admitting: Adult Health

## 2019-03-25 DIAGNOSIS — E785 Hyperlipidemia, unspecified: Secondary | ICD-10-CM

## 2019-03-25 DIAGNOSIS — Z992 Dependence on renal dialysis: Secondary | ICD-10-CM

## 2019-03-25 DIAGNOSIS — I120 Hypertensive chronic kidney disease with stage 5 chronic kidney disease or end stage renal disease: Secondary | ICD-10-CM

## 2019-03-25 DIAGNOSIS — E1122 Type 2 diabetes mellitus with diabetic chronic kidney disease: Secondary | ICD-10-CM

## 2019-03-25 DIAGNOSIS — E1169 Type 2 diabetes mellitus with other specified complication: Secondary | ICD-10-CM | POA: Diagnosis not present

## 2019-03-25 DIAGNOSIS — N186 End stage renal disease: Secondary | ICD-10-CM

## 2019-03-25 LAB — HIV-1 RNA QUANT-NO REFLEX-BLD
HIV 1 RNA Quant: <20 copies/mL — AB
HIV-1 RNA Quant, Log: <1.3 Log copies/mL — AB

## 2019-03-25 LAB — RPR: RPR Ser Ql: NONREACTIVE

## 2019-03-25 NOTE — Progress Notes (Signed)
Location:    [facility] Room Number: 154/W Place of Service:  SNF (31)   CODE STATUS: DNR  No Known Allergies  Chief Complaint  Patient presents with   Medical Management of Chronic Issues          Dyslipidemia associated with type 2 diabetes mellitus:  Type 2 diabetes mellitus with hypertension and end stage renal disease on dialysis . Chronic kidney disease with end stage renal disease on dialysis due to type 2 diabetes mellitus/dialysis dependent   Weekly follow up for the first 30 days post hospitalization     HPI:  He is a 68 year old long term resident of this facility being seen for the management of his chronic illnesses: dyslipidemia; diabetes ckd. There are no reports of further syncopal episodes; no reports of uncontrolled pain; no reports of anxiety or agitation.   Past Medical History:  Diagnosis Date   AICD (automatic cardioverter/defibrillator) present    Alcoholism in remission ([place])    Anemia    Aortic insufficiency    Arthritis    At moderate risk for fall    Bell's palsy    Chronic systolic heart failure ([place])    a. EF previously 15-20% with cath in 2014 showing no significant CAD b. EF 20-25% by echo in 06/2018   CKD (chronic kidney disease), stage IV (HCC)    COPD (chronic obstructive pulmonary disease) ([place])    Essential hypertension, benign    HIV disease ([place]) 09/06/2016   Hyperlipidemia    NICM (nonischemic cardiomyopathy) ([place])    Noncompliance with medication regimen    NSVT (nonsustained ventricular tachycardia) (HCC)    Numbness of right jaw    Had a stoke in 01/2013. Numbness is occasional, especially when trying to chew.   OSA (obstructive sleep apnea)    Productive cough 08/2013   With brown sputum    S/P aortic valve replacement with bioprosthetic valve    Stroke ([place]) 01/2013   weakness of right side from CVA   Type 2 diabetes mellitus ([place])    Type 2 diabetes mellitus with hypertension and  end stage renal disease on dialysis ([place]) 07/31/2018   Uses hearing aid 2014   recently received new hearing aids    Past Surgical History:  Procedure Laterality Date   AV FISTULA PLACEMENT Left 07/20/2018   Procedure: INSERTION ARTERIOVENOUS GORTEX GRAFT  LEFT ARM;  Surgeon: Rosetta Posner, MD;  Location: [facility];  Service: Vascular;  Laterality: Left;   BIOPSY N/A 04/10/2014   Procedure: GASTRIC BIOPSY;  Surgeon: Daneil Dolin, MD;  Location: [facility];  Service: Endoscopy;  Laterality: N/A;   BIOPSY  10/12/2015   Procedure: BIOPSY;  Surgeon: Daneil Dolin, MD;  [facility];  Service: Endoscopy;;  stomach bx's   CARDIAC DEFIBRILLATOR PLACEMENT  11/12/12   [employer] Inogen MINI ICD implanted in [place] at [facility] per Dr Nelly Laurence note   COLONOSCOPY WITH PROPOFOL N/A 04/10/2014   RMR: Colonic Diverticulosis   ESOPHAGOGASTRODUODENOSCOPY (EGD) WITH PROPOFOL N/A 04/10/2014   RMR: Mild erosive reflux esophagitis. Multiple antral polyps likely hyperplastic status post removal by hot snare cautery technique. Diffusely abnormal stomach status post gastric biopsy I suspect some of patients anemaia may be due to intermittent oozing from the stomach. It would be difficult and a risky proposition to attempt complete removal of all of his gastric polyps.   ESOPHAGOGASTRODUODENOSCOPY (EGD) WITH  PROPOFOL N/A 10/12/2015   Procedure: ESOPHAGOGASTRODUODENOSCOPY (EGD) WITH PROPOFOL;  Surgeon: Daneil Dolin, MD;  [facility];  Service: Endoscopy;  Laterality: N/A;  1610 - moved to [place] Right 07/20/2018   Procedure: EXCHANGE OF DIALYSIS CATHETER RIGHT INTERNAL JUGULAR;  Surgeon: Rosetta Posner, MD;  Location: [facility];  Service: Vascular;  Laterality: Right;   IR FLUORO GUIDE CV LINE RIGHT  07/09/2018   IR US GUIDE VASC ACCESS RIGHT  07/09/2018   POLYPECTOMY N/A 04/10/2014   Procedure: GASTRIC  POLYPECTOMY;  Surgeon: Daneil Dolin, MD;  Location: [facility];  Service: Endoscopy;  Laterality: N/A;   RIGHT HEART CATHETERIZATION N/A 02/06/2014   Procedure: RIGHT HEART CATH;  Surgeon: Larey Dresser, MD;  Location: [facility] CATH LAB;  Service: Cardiovascular;  Laterality: N/A;    Social History   Socioeconomic History   Marital status: Single    Spouse name: Not on file   Number of children: Not on file   Years of education: Not on file   Highest education level: Not on file  Occupational History   Occupation: disabled  Social Designer, fashion/clothing strain: Not hard at all   Food insecurity    Worry: Never true    Inability: Never true   Transportation needs    Medical: No    Non-medical: No  Tobacco Use   Smoking status: Former Smoker    Types: Cigarettes    Quit date: 11/05/1993    Years since quitting: 25.4   Smokeless tobacco: Never Used  Substance and Sexual Activity   Alcohol use: Not Currently    Comment: former heavy ETOH, none recently   Drug use: No    Comment: prior history of cocaine use, smoking    Sexual activity: Yes    Birth control/protection: None  Lifestyle   Physical activity    Days per week: 0 days    Minutes per session: 0 min   Stress: Not at all  Relationships   Social connections    Talks on phone: Never    Gets together: Once a week    Attends religious service: Never    Active member of club or organization: No    Attends meetings of clubs or organizations: Never    Relationship status: Never married   Intimate partner violence    Fear of current or ex partner: No    Emotionally abused: No    Physically abused: No    Forced sexual activity: No  Other Topics Concern   Not on file  Social History Narrative    Has h/o polysubstance abuse.  Baseline    2019- resident of [facility]. Unable to ambulate unassisted.            Family History  Problem Relation Age of Onset   Kidney disease Mother     Kidney disease Sister    Colon cancer Neg Hx       VITAL SIGNS BP 118/68    Pulse 70    Temp 97.6 F (36.4 C) (Oral)    Resp 20    Ht '5\' 3"'$  (1.6 m)    Wt 138 lb 3.2 oz (62.7 kg)    SpO2 94%    BMI 24.48 kg/m   Outpatient Encounter Medications as of 03/25/2019  Medication Sig   acetaminophen (TYLENOL) 325 MG tablet Take 2 tablets (650 mg total) by mouth every 4 (four) hours as needed for  mild pain (or temp > 37.5 C (99.5 F)).   albuterol (PROVENTIL) (2.5 MG/3ML) 0.083% nebulizer solution Take 3 mLs (2.5 mg total) by nebulization every 4 (four) hours as needed for wheezing or shortness of breath.   atorvastatin (LIPITOR) 80 MG tablet Take 80 mg by mouth daily.    Balsam [place]-Castor Oil (VENELEX) OINT Ointment; - ; topical  Special Instructions: Apply to sacrum qshift & prn. Every Shift Day, Evening, Night   bictegravir-emtricitabine-tenofovir AF (BIKTARVY) 50-200-25 MG TABS tablet Take 1 tablet by mouth daily.   calcium carbonate (TUMS EX) 750 MG chewable tablet Chew 1 tablet by mouth 2 (two) times daily.   Cholecalciferol (VITAMIN D3) 2000 UNITS TABS Take 2,000 mg by mouth daily.   coal tar (NEUTROGENA T-GEL) 0.5 % shampoo Apply to scalp on shower days (Twice a week) for dandruff and dry scalp Wed., and Sat.   docusate sodium (COLACE) 100 MG capsule Take 100 mg by mouth daily.   Fluticasone-Salmeterol (ADVAIR) 100-50 MCG/DOSE AEPB Inhale 1 puff into the lungs daily.   metoprolol tartrate (LOPRESSOR) 100 MG tablet Take 100 mg by mouth 2 (two) times daily.   midodrine (PROAMATINE) 10 MG tablet Take 10 mg by mouth. Send medication with resident to be given at center. Once a day on Tues.,Thurs., Sat.   multivitamin (RENA-VIT) TABS tablet Take 1 tablet by mouth at bedtime.   NON FORMULARY Diet Type:  Dysphagia 2 with thin liquids (NO Straws)   Nutritional Supplements (FEEDING SUPPLEMENT, NEPRO CARB STEADY,) LIQD Take by mouth daily.   omeprazole (PRILOSEC) 20 MG capsule Take  20 mg by mouth at bedtime.   senna-docusate (SENEXON-S) 8.6-50 MG tablet Take 1 tablet by mouth daily.   UNABLE TO FIND FLUID RESTRICTION 1200 CC/ 24hrs. Breakfast (12oz) 360 ml lunch (8oz) 240 ml Dinner (12oz) 360 ml Every Shift Day, Evening, Night   warfarin (COUMADIN) 1 MG tablet Take 1 mg by mouth daily. Give along with 2.5 mg tab for total of 3.5 daily   warfarin (COUMADIN) 2.5 MG tablet Take 2.5 mg by mouth daily. Give along with 1 mg tab for total of 3.5 mg daily   ZINC OXIDE EX Apply Zinc oxide to bilateral buttocks area for irritation. Every Shift Day, Day, Day   No facility-administered encounter medications on file as of 03/25/2019.      SIGNIFICANT DIAGNOSTIC EXAMS   PREVIOUS   07-07-18: US renal: Medical renal disease changes of both kidneys. Small renal cysts in both kidneys. No evidence of hydronephrosis.  07-11-18: ct of head:  1. Stable head CT, with no new acute intracranial abnormality. 2. In band cerebral atrophy with chronic small vessel ischemic disease with multiple chronic ischemic infarcts as above. 3. Acute pan sinusitis, worsened from previous.  05-12-19 ct of abdomen and pelvis: Diverticulosis without diverticulitis. Bilateral renal cysts. Mild bibasilar atelectasis.  03-02-19: ct of head:  1. No acute cortically based infarct or acute intracranial hemorrhage identified. 2. Stable non contrast CT appearance of advanced chronic ischemic disease since February. 3.  ASPECTS 10 (chronic encephalomalacia).  03-02-19: ct angio of head and neck CTA head: 1. No intracranial large vessel occlusion. 2. Moderate focal stenosis within the P2 left PCA. 3. Mild focal stenosis within the right PCA at the P1-P2 junction. 4. Additional intracranial atherosclerotic disease without significant stenosis as described. CTA neck: 1. Atherosclerotic disease within the imaged aortic arch and major branch vessels of the neck as detailed and most notably as  follows. 2. Approximately 60-70% stenosis  of the proximal left ICA. 3. Less than 50% stenosis of the proximal right ICA. 4. Mild/moderate ostial stenosis of the dominant left vertebral artery. The bilateral vertebral arteries are otherwise patent within the neck without significant stenosis. 5. Anasarca. Partially visualized bilateral pleural effusions. The right pleural effusion may be loculated. 6. Sequela of renal osteodystrophy.  03-03-19" 2-d echo 1. Left ventricular ejection fraction, by visual estimation, is 15-20%. The left ventricle has severely decreased function. Moderately increased left ventricular size. There is mildly increased left ventricular hypertrophy. There is global hypokinesis  with some regional variation.  2. Left ventricular diastolic Doppler parameters are consistent with impaired relaxation pattern of LV diastolic filling.  3. Global right ventricle has mildly reduced systolic function.The right ventricular size is normal. No increase in right ventricular wall thickness.  4. Left atrial size was mildly dilated.  5. Right atrial size was moderately dilated.  6. The mitral valve is grossly normal. Mild mitral valve regurgitation.  7. The tricuspid valve is grossly normal. Tricuspid valve regurgitation moderate.  8. The pulmonic valve was grossly normal. Pulmonic valve regurgitation is trivial by color flow Doppler.  9. Mildly elevated pulmonary artery systolic pressure. 10. A ICD wire is visualized. 11. The inferior vena cava is dilated in size with <50% respiratory variability, suggesting right atrial pressure of 15 mmHg. 12. Bioprosthesis in aortic position (unknown size and type). No perivalvular leak. Acoustic shadowing noted in association with leaflet motion - unable to completely exclude vegetation based on images. If clinically indicated and feasible, could  consider TEE. 13. Aortic valve regurgitation was not visualized by color flow Doppler. 14. Aortic valve  mean gradient measures 10.7 mmHg. 15. The tricuspid regurgitant velocity is 2.17 m/s, and with an assumed right atrial pressure of 15 mmHg, the estimated right ventricular systolic pressure is mildly elevated at 33.8 mmHg.   03-05-19: EEG: This recording of awake and drowsy states shows mild global slowing indicating a mild global encephalopathy. However, no epileptiform activities are documented.   03-05-19; MRI of brain: No acute infarct Atrophy with extensive chronic ischemic changes.  03-07-19: ct of abdomen and pelvis:  1. Interval large amount of free peritoneal fluid in the abdomen and pelvis. 2. Interval diffuse subcutaneous edema. 3. Small to moderate-sized right pleural effusion and small left pleural effusion. 4. Mild increase in prominence of the interstitial markings, compatible with interstitial pulmonary edema. 5. Stable possible early changes of cirrhosis of the liver. 6. Colonic diverticulosis. 7. Bilateral L5 spondylolysis with minimal spondylolisthesis at the L5-S1 level. 8. 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 9. Stable cardiomegaly and dense calcific coronary artery and aortic atherosclerosis.  03/13/2019: paracentesis: Successful ultrasound-guided paracentesis yielding 3.9 liters of peritoneal fluid.  PREVIOUS   LABS REVIEWED PREVIOUS:   05-22-18: hgb a1c 6.5 chol 100 ldl 43; trig 170; hdl 23 07-04-18: wbc  3.7; hgb 14.0; hct 45.6; mcv 91.8; plt 124; glucose 100; bun 45; creat 2.44; k+ 4.7; na++ 139 BNP 679.0  07-06-18: wbc 1.9; hgb 15.2; hct 48.8; mcv 91.2 plt 107; glucose 136; bun 43; creat 3.01; k+ 5.9; na++ 136; ca 8.8 ast 70 alt 46; albumin 3.0 BNP 554.5   blood culture: no growth /e-coli  07-08-18: wbc 13.8; hgb 13.1; hct 41.1; mcv 93.2 plt 73 glucose 108 ;bun 74; creat 5.32; k+ 5.5; na++ 139; ca 7.5; ast 80 alt 117; albumin 2.3  07-09-18; tsh 3.098 07-17-18: wbc 3.1; hgb 1.8; hct 37.3; mcv 89.9 plt  103 glucose 114; bun 95;  creat 6.00; k+ 3.5; na++ 139; ca 6.4 mag 2.1 07-27-18: INR 2.3: coumadin 5 mg daily   07-30-18: wbc  6.8; hgb 10.7; hct 33.6; mcv 89.6; plt 94 glucose 90; bun 93; creat 7.47; k+ 4.2; na++ 135; ca 7.1 08-02-18: INR 1.9   08-15-18: HIV quant: <20 CD4: 360  08-16-18: INR 3.2  08-23-18: INR  1.7 09-04-18: hgb 7.4 hct 23.6  glucose 90; bun 16; create 2.64; k+ 2.8; na++ 138; ca 8.3 phos 3.0; albumin 2.9 iron 52; tibc 218; PTH 124 vit 69.3  09-06-18: INR 2.5 guaiac neg  09-07-18: glucose 99; bun 22; creat 3.24; k+ 3.4;na++ 137; ca 8.4  09-11-18 hgb 10.3; hct 33.9 glucose 83; bun 18; creat 2.48; k+ 3.8; na++ 140; ca 8.9 iron 43; tibc 225 PTH 115 vit D 44.6 09-20-18: INR 2.1 09-22-18: guaiac: neg  10-13-18: INR 2.4 10-19-18: INR 2.1 11-02-18: INR 2.2  11-16-18: INR 3.8 11-19-18: INR 2.2 11-26-18: INR 1.6  12-05-18: hgb a1c 6.1 12-10-18: INR 1.9 12-18-18: INR 2.2  01-01-19: INR 2.3  01-10-19: wbc 2.3; hgb 12.1; hct 37.5; mcv 96.9; plt 101; INR 3.0 01-15-19: wbc 2.0; hgb 12.8; hct 39.5; mcv 96.1; plt 100; glucose 126; bun 40; creat 5.45; k+ 3.8; na++ 136; ca 8.8; liver normal albumin 3.2; PSA 0.98; INR 3.9 01-22-19; INR 3.1  01-29-19: INR 2.7 02-12-19: INR 4.6  02-15-19: INR 2.9  02-26-19: INR 1.9  03-02-19: wbc 2.4; hgb 12.4; hct 36.3; mcv 92.6; plt 79; glucose 106; bun 19; creat 2.53; k+ 3.1; na++ 136; ca 8.3; ast 47 alt 45; alk phos 161; total bili 1.8; albumin 2.9; INR 2.0 03-03-19: chol 113; ldl 68; trig 71; hdl 31 03-04-19: wbc 2.7; hgb 12.7; hct 38.2; mcv 95.0 plt 79; glucose 80; bun 30; creat 3.96; k+ 4.0; na++ 138; ca 8.8; mag 2.1; phos 3.9 02/28/2019: wbc 2.7; hgb 11.5; hct 32.9; mcv 90.9 ;plt 68; glucose 120; bun 23; creat 3.41; k+ 3.4; na++ 134; ca 8.8  03-12-19: glucose 100 bun 37; creat 5.01 ;k+ 3.8; na++ 133 ca 8.6; total bili 1.3 albumin 2.9 INR: 2.2  NO NEW LABS.    Review of Systems  Unable to perform ROS: Dementia (unable to participate )     Physical Exam Constitutional:      General: He is not in acute  distress.    Appearance: He is well-developed. He is not diaphoretic.  Neck:     Musculoskeletal: Neck supple.     Thyroid: No thyromegaly.  Cardiovascular:     Rate and Rhythm: Normal rate. Rhythm irregular.     Pulses: Normal pulses.     Heart sounds: Murmur present.     Comments: 2/6 ICD present  Pulmonary:     Effort: Pulmonary effort is normal. No respiratory distress.     Breath sounds: Normal breath sounds.  Abdominal:     General: Bowel sounds are normal. There is no distension.     Palpations: Abdomen is soft.     Tenderness: There is no abdominal tenderness.  Musculoskeletal:     Right lower leg: No edema.     Left lower leg: No edema.     Comments: Is able to move all extremities   Lymphadenopathy:     Cervical: No cervical adenopathy.  Skin:    General: Skin is warm and dry.     Comments:  Left upper arm a/v fistula: + thrill + bruit   Neurological:  Mental Status: He is alert. Mental status is at baseline.  Psychiatric:        Mood and Affect: Mood normal.      ASSESSMENT/ PLAN:  TODAY:   1. Dyslipidemia associated with type 2 diabetes mellitus: is stable LDL 68 will continue lipitor 80 mg daily   2. 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 Chronic kidney disease with end stage renal disease on dialysis due to type 2 diabetes mellitus/dialysis dependent is stable is dialyzed 3 times weekly followed by nephrology is on 1200 cc fluid restriction midodrine 10 mg with dialysis; calcium 750 mg twice daily   PREVIOUS  4.  Other emphysema is stable will continue albuterol neb every 4 hours as needed will continue advair 100/50 1 puff daily   5. HIV disease without change will continue bikatarvy 20-200-25 mg daily   6. CVA/Multiple CVA; is neurologically stable is on chronic coumadin therapy.   7. Anemia due to end  stage renal disease is stable hgb 11.5 will monitor  8. 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 Protein calorie malnutrition severe; stable albumin 2.9 will continue supplements as directed weight is 138  pounds.   10. PBH with obstruction/urinary tract symptoms: is stable off flomax due to syncopal episode   11. GERD without esophagitis: is stable will continue prilosec 20 mg daily   12. Chronic constipation: is stable will continue colace and senna s daily   13. 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   14. Vascular dementia without behavioral disturbance: is without change weight is 138 pounds will monitor   15. Syncope and collapse: is stable; more than likely due to EF. Will monitor his status.   16. 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 Hypertension associated with stage 5 chronic kidney disease due to type 2 diabetes mellitus: is stable b/p 118/68 will continue lopressor 100 mg twice daily   18. PAF (paroxsymal atrial fibrillation) status post bioprosthetic avr: heart rate is stable will continue toprol xl 25 mg daily for rate control is on chronic coumadin therapy     MD is aware of resident's narcotic use and is in agreement with current plan of care. We will attempt to wean resident as appropriate.  Ok Edwards NP [facility]  Contact [phone] Monday through Friday 8am- 5pm  After hours call [phone]

## 2019-03-26 ENCOUNTER — Encounter: Payer: Self-pay | Admitting: Adult Health

## 2019-03-26 NOTE — Progress Notes (Signed)
Location:    [facility] Room Number: 154/W Place of Service:  SNF (31)   CODE STATUS: DNR  No Known Allergies  Chief Complaint  Patient presents with  . Anticoagulation    INR    HPI:    Past Medical History:  Diagnosis Date  . AICD (automatic cardioverter/defibrillator) present   . Alcoholism in remission ([place])   . Anemia   . Aortic insufficiency   . Arthritis   . At moderate risk for fall   . Bell's palsy   . Chronic systolic heart failure (HCC)    a. EF previously 15-20% with cath in 2014 showing no significant CAD b. EF 20-25% by echo in 06/2018  . CKD (chronic kidney disease), stage IV ([place])   . COPD (chronic obstructive pulmonary disease) ([place])   . Essential hypertension, benign   . HIV disease ([place]) 09/06/2016  . Hyperlipidemia   . NICM (nonischemic cardiomyopathy) ([place])   . Noncompliance with medication regimen   . NSVT (nonsustained ventricular tachycardia) ([place])   . Numbness of right jaw    Had a stoke in 01/2013. Numbness is occasional, especially when trying to chew.  . OSA (obstructive sleep apnea)   . Productive cough 08/2013   With brown sputum   . S/P aortic valve replacement with bioprosthetic valve   . Stroke ([place]) 01/2013   weakness of right side from CVA  . Type 2 diabetes mellitus ([place])   . 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 Uses hearing aid 2014   recently received new hearing aids    Past Surgical History:  Procedure Laterality Date  . AV FISTULA PLACEMENT Left 07/20/2018   Procedure: INSERTION ARTERIOVENOUS GORTEX GRAFT  LEFT ARM;  Surgeon: Rosetta Posner, MD;  Location: [facility];  Service: Vascular;  Laterality: Left;  . BIOPSY N/A 04/10/2014   Procedure: GASTRIC BIOPSY;  Surgeon: Daneil Dolin, MD;  Location: [facility];  Service: Endoscopy;  Laterality: N/A;  . BIOPSY  10/12/2015   Procedure: BIOPSY;  Surgeon: Daneil Dolin, MD;  [facility];   Service: Endoscopy;;  stomach bx's  . CARDIAC DEFIBRILLATOR PLACEMENT  11/12/12   [employer] Inogen MINI ICD implanted in [place] at [facility] per Dr Nelly Laurence note  . COLONOSCOPY WITH PROPOFOL N/A 04/10/2014   RMR: Colonic Diverticulosis  . ESOPHAGOGASTRODUODENOSCOPY (EGD) WITH PROPOFOL N/A 04/10/2014   RMR: Mild erosive reflux esophagitis. Multiple antral polyps likely hyperplastic status post removal by hot snare cautery technique. Diffusely abnormal stomach status post gastric biopsy I suspect some of patients anemaia may be due to intermittent oozing from the stomach. It would be difficult and a risky proposition to attempt complete removal of all of his gastric polyps.  . ESOPHAGOGASTRODUODENOSCOPY (EGD) WITH PROPOFOL N/A 10/12/2015   Procedure: ESOPHAGOGASTRODUODENOSCOPY (EGD) WITH PROPOFOL;  Surgeon: Daneil Dolin, MD;  [facility];  Service: Endoscopy;  Laterality: N/A;  2671 - moved to 9:15  . INSERTION OF DIALYSIS CATHETER Right 07/20/2018   Procedure: EXCHANGE OF DIALYSIS CATHETER RIGHT INTERNAL JUGULAR;  Surgeon: Rosetta Posner, MD;  Location: [facility];  Service: Vascular;  Laterality: Right;  . IR FLUORO GUIDE CV LINE RIGHT  07/09/2018  . IR US GUIDE VASC ACCESS RIGHT  07/09/2018  . POLYPECTOMY N/A 04/10/2014   Procedure: GASTRIC POLYPECTOMY;  Surgeon: Daneil Dolin, MD;  Location: [facility];  Service: Endoscopy;  Laterality: N/A;  . RIGHT HEART CATHETERIZATION N/A 02/06/2014   Procedure: RIGHT HEART CATH;  Surgeon: Larey Dresser, MD;  Location: [facility] CATH LAB;  Service: Cardiovascular;  Laterality: N/A;    Social History   Socioeconomic History  . Marital status: Single    Spouse name: Not on file  . Number of children: Not on file  . Years of education: Not on file  . Highest education level: Not on file  Occupational History  . Occupation: disabled  Social Needs  . Financial resource strain: Not hard at all   . Food insecurity    Worry: Never true    Inability: Never true  . Transportation needs    Medical: No    Non-medical: No  Tobacco Use  . Smoking status: Former Smoker    Types: Cigarettes    Quit date: 11/05/1993    Years since quitting: 25.4  . Smokeless tobacco: Never Used  Substance and Sexual Activity  . Alcohol use: Not Currently    Comment: former heavy ETOH, none recently  . Drug use: No    Comment: prior history of cocaine use, smoking   . Sexual activity: Yes    Birth control/protection: None  Lifestyle  . Physical activity    Days per week: 0 days    Minutes per session: 0 min  . Stress: Not at all  Relationships  . Social Herbalist on phone: Never    Gets together: Once a week    Attends religious service: Never    Active member of club or organization: No    Attends meetings of clubs or organizations: Never    Relationship status: Never married  . Intimate partner violence    Fear of current or ex partner: No    Emotionally abused: No    Physically abused: No    Forced sexual activity: No  Other Topics Concern  . Not on file  Social History Narrative    Has h/o polysubstance abuse.  Baseline    2019- [facility] of [facility]. Unable to ambulate unassisted.            Family History  Problem Relation Age of Onset  . Kidney disease Mother   . Kidney disease Sister   . Colon cancer Neg Hx       VITAL SIGNS BP 133/80   Pulse (!) 56   Temp 97.6 F (36.4 C) (Oral)   Resp 20   Ht 5\' 3"  (1.6 m)   Wt 138 lb 3.2 oz (62.7 kg)   SpO2 94%   BMI 24.48 kg/m   Outpatient Encounter Medications as of 03/26/2019  Medication Sig  . acetaminophen (TYLENOL) 325 MG tablet Take 2 tablets (650 mg total) by mouth every 4 (four) hours as needed for mild pain (or temp > 37.5 C (99.5 F)).  Marland Kitchen albuterol (PROVENTIL) (2.5 MG/3ML) 0.083% nebulizer solution Take 3 mLs (2.5 mg total) by nebulization every 4 (four) hours as needed for wheezing or  shortness of breath.  Marland Kitchen atorvastatin (LIPITOR) 80 MG tablet Take 80 mg by mouth daily.   Roseanne Kaufman [place]-Castor Oil (VENELEX) OINT Ointment; - ; topical  Special Instructions: Apply to sacrum qshift & prn. Every Shift Day, Evening, Night  . bictegravir-emtricitabine-tenofovir AF (BIKTARVY) 50-200-25 MG TABS tablet Take 1 tablet by mouth daily.  . calcium carbonate (TUMS EX) 750 MG chewable tablet Chew 1 tablet by mouth 2 (two) times  daily.  . Cholecalciferol (VITAMIN D3) 2000 UNITS TABS Take 2,000 mg by mouth daily.  . coal tar (NEUTROGENA T-GEL) 0.5 % shampoo Apply to scalp on shower days (Twice a week) for dandruff and dry scalp Wed., and Sat.  . docusate sodium (COLACE) 100 MG capsule Take 100 mg by mouth daily.  . Fluticasone-Salmeterol (ADVAIR) 100-50 MCG/DOSE AEPB Inhale 1 puff into the lungs daily.  . metoprolol tartrate (LOPRESSOR) 100 MG tablet Take 100 mg by mouth 2 (two) times daily.  . midodrine (PROAMATINE) 10 MG tablet Take 10 mg by mouth. Send medication with resident to be given at center. Once a day on Tues.,Thurs., Sat.  . multivitamin (RENA-VIT) TABS tablet Take 1 tablet by mouth at bedtime.  . NON FORMULARY Diet Type:  Dysphagia 2 with thin liquids (NO Straws)  . Nutritional Supplements (FEEDING SUPPLEMENT, NEPRO CARB STEADY,) LIQD Take by mouth daily.  Marland Kitchen omeprazole (PRILOSEC) 20 MG capsule Take 20 mg by mouth at bedtime.  . senna-docusate (SENEXON-S) 8.6-50 MG tablet Take 1 tablet by mouth daily.  Marland Kitchen UNABLE TO FIND FLUID RESTRICTION 1200 CC/ 24hrs. Breakfast (12oz) 360 ml lunch (8oz) 240 ml Dinner (12oz) 360 ml Every Shift Day, Evening, Night  . warfarin (COUMADIN) 1 MG tablet Take 1 mg by mouth daily. Give along with 2.5 mg tab for total of 3.5 daily  . warfarin (COUMADIN) 2.5 MG tablet Take 2.5 mg by mouth daily. Give along with 1 mg tab for total of 3.5 mg daily  . ZINC OXIDE EX Apply Zinc oxide to bilateral buttocks area for irritation. Every Shift Day, Day, Day    No facility-administered encounter medications on file as of 03/26/2019.      SIGNIFICANT DIAGNOSTIC EXAMS       ASSESSMENT/ PLAN:    MD is aware of resident's narcotic use and is in agreement with current plan of care. We will attempt to wean resident as appropriate.  Ok Edwards NP [facility]  Contact [phone] Monday through Friday 8am- 5pm  After hours call [phone]   This encounter was created in error - please disregard.

## 2019-03-27 ENCOUNTER — Other Ambulatory Visit (HOSPITAL_COMMUNITY)
Admission: RE | Admit: 2019-03-27 | Discharge: 2019-03-27 | Disposition: A | Discharge status: Home / Self Care | Payer: Medicare HMO | Source: Skilled Nursing Facility | Attending: Adult Health | Admitting: Adult Health

## 2019-03-27 ENCOUNTER — Encounter: Payer: Self-pay | Admitting: Adult Health

## 2019-03-27 ENCOUNTER — Encounter: Payer: Medicare HMO | Admitting: Internal Medicine

## 2019-03-27 ENCOUNTER — Non-Acute Institutional Stay (SKILLED_NURSING_FACILITY): Payer: Medicare HMO | Admitting: Adult Health

## 2019-03-27 DIAGNOSIS — Z7901 Long term (current) use of anticoagulants: Secondary | ICD-10-CM | POA: Insufficient documentation

## 2019-03-27 DIAGNOSIS — I480 Paroxysmal atrial fibrillation: Secondary | ICD-10-CM

## 2019-03-27 DIAGNOSIS — I639 Cerebral infarction, unspecified: Secondary | ICD-10-CM

## 2019-03-27 DIAGNOSIS — Z952 Presence of prosthetic heart valve: Secondary | ICD-10-CM

## 2019-03-27 LAB — PROTIME-INR
INR: 6 (ref 0.8–1.2)
Prothrombin Time: 52.5 seconds — ABNORMAL HIGH (ref 11.4–15.2)

## 2019-03-27 NOTE — Progress Notes (Signed)
Location:    [place] Room Number: 154/W Place of Service:  SNF (31)   CODE STATUS: DNR  No Known Allergies  Chief Complaint  Patient presents with  . Anticoagulation    INR    HPI:  He is on long term coumadin therapy for afib; cva and valve replacement. His INR goal is 2.5-3.0 his INR today is 6.0. there are reports of changes in his diet or medications. No reports of heart palpitations; no leg swelling; no reports of excessive weakness. There are no reports of excessive bleeding or bruising.     Past Medical History:  Diagnosis Date  . AICD (automatic cardioverter/defibrillator) present   . Alcoholism in remission ([place])   . Anemia   . Aortic insufficiency   . Arthritis   . At moderate risk for fall   . Bell's palsy   . Chronic systolic heart failure (HCC)    a. EF previously 15-20% with cath in 2014 showing no significant CAD b. EF 20-25% by echo in 06/2018  . CKD (chronic kidney disease), stage IV ([place])   . COPD (chronic obstructive pulmonary disease) ([place])   . Essential hypertension, benign   . HIV disease ([place]) 09/06/2016  . Hyperlipidemia   . NICM (nonischemic cardiomyopathy) ([place])   . Noncompliance with medication regimen   . NSVT (nonsustained ventricular tachycardia) ([place])   . Numbness of right jaw    Had a stoke in 01/2013. Numbness is occasional, especially when trying to chew.  . OSA (obstructive sleep apnea)   . Productive cough 08/2013   With brown sputum   . S/P aortic valve replacement with bioprosthetic valve   . Stroke ([place]) 01/2013   weakness of right side from CVA  . Type 2 diabetes mellitus ([place])   . 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 Uses hearing aid 2014   recently received new hearing aids    Past Surgical History:  Procedure Laterality Date  . AV FISTULA PLACEMENT Left 07/20/2018   Procedure: INSERTION ARTERIOVENOUS GORTEX GRAFT  LEFT ARM;   Surgeon: Rosetta Posner, MD;  Location: [facility];  Service: Vascular;  Laterality: Left;  . BIOPSY N/A 04/10/2014   Procedure: GASTRIC BIOPSY;  Surgeon: Daneil Dolin, MD;  Location: [facility];  Service: Endoscopy;  Laterality: N/A;  . BIOPSY  10/12/2015   Procedure: BIOPSY;  Surgeon: Daneil Dolin, MD;  [facility];  Service: Endoscopy;;  stomach bx's  . CARDIAC DEFIBRILLATOR PLACEMENT  11/12/12   [employer] Inogen MINI ICD implanted in [place] at [place] AVR per Dr Nelly Laurence note  . COLONOSCOPY WITH PROPOFOL N/A 04/10/2014   RMR: Colonic Diverticulosis  . ESOPHAGOGASTRODUODENOSCOPY (EGD) WITH PROPOFOL N/A 04/10/2014   RMR: Mild erosive reflux esophagitis. Multiple antral polyps likely hyperplastic status post removal by hot snare cautery technique. Diffusely abnormal stomach status post gastric biopsy I suspect some of patients anemaia may be due to intermittent oozing from the stomach. It would be difficult and a risky proposition to attempt complete removal of all of his gastric polyps.  . ESOPHAGOGASTRODUODENOSCOPY (EGD) WITH PROPOFOL N/A 10/12/2015   Procedure: ESOPHAGOGASTRODUODENOSCOPY (EGD) WITH PROPOFOL;  Surgeon: Daneil Dolin, MD;  [facility];  Service: Endoscopy;  Laterality: N/A;  3790 - moved to 9:15  . INSERTION OF DIALYSIS CATHETER Right 07/20/2018   Procedure: EXCHANGE OF DIALYSIS  CATHETER RIGHT INTERNAL JUGULAR;  Surgeon: Rosetta Posner, MD;  Location: [facility] OR;  Service: Vascular;  Laterality: Right;  . IR FLUORO GUIDE CV LINE RIGHT  07/09/2018  . IR US GUIDE VASC ACCESS RIGHT  07/09/2018  . POLYPECTOMY N/A 04/10/2014   Procedure: GASTRIC POLYPECTOMY;  Surgeon: Daneil Dolin, MD;  Location: [facility];  Service: Endoscopy;  Laterality: N/A;  . RIGHT HEART CATHETERIZATION N/A 02/06/2014   Procedure: RIGHT HEART CATH;  Surgeon: Larey Dresser, MD;  Location: [facility] CATH LAB;  Service: Cardiovascular;  Laterality:  N/A;    Social History   Socioeconomic History  . Marital status: Single    Spouse name: Not on file  . Number of children: Not on file  . Years of education: Not on file  . Highest education level: Not on file  Occupational History  . Occupation: disabled  Social Needs  . Financial resource strain: Not hard at all  . Food insecurity    Worry: Never true    Inability: Never true  . Transportation needs    Medical: No    Non-medical: No  Tobacco Use  . Smoking status: Former Smoker    Types: Cigarettes    Quit date: 11/05/1993    Years since quitting: 25.4  . Smokeless tobacco: Never Used  Substance and Sexual Activity  . Alcohol use: Not Currently    Comment: former heavy ETOH, none recently  . Drug use: No    Comment: prior history of cocaine use, smoking   . Sexual activity: Yes    Birth control/protection: None  Lifestyle  . Physical activity    Days per week: 0 days    Minutes per session: 0 min  . Stress: Not at all  Relationships  . Social Herbalist on phone: Never    Gets together: Once a week    Attends religious service: Never    Active member of club or organization: No    Attends meetings of clubs or organizations: Never    Relationship status: Never married  . Intimate partner violence    Fear of current or ex partner: No    Emotionally abused: No    Physically abused: No    Forced sexual activity: No  Other Topics Concern  . Not on file  Social History Narrative    Has h/o polysubstance abuse.  Baseline    2019- resident of [facility]. Unable to ambulate unassisted.            Family History  Problem Relation Age of Onset  . Kidney disease Mother   . Kidney disease Sister   . Colon cancer Neg Hx       VITAL SIGNS BP 130/70   Pulse 71   Temp (!) 97 F (36.1 C) (Oral)   Resp 20   Ht '5\' 3"'  (1.6 m)   Wt 138 lb 3.2 oz (62.7 kg)   SpO2 94%   BMI 24.48 kg/m   Outpatient Encounter Medications as of 03/27/2019   Medication Sig  . acetaminophen (TYLENOL) 325 MG tablet Take 2 tablets (650 mg total) by mouth every 4 (four) hours as needed for mild pain (or temp > 37.5 C (99.5 F)).  Marland Kitchen albuterol (PROVENTIL) (2.5 MG/3ML) 0.083% nebulizer solution Take 3 mLs (2.5 mg total) by nebulization every 4 (four) hours as needed for wheezing or shortness of breath.  Marland Kitchen atorvastatin (LIPITOR) 80 MG tablet Take 80 mg by mouth daily.   Marland Kitchen  Balsam [place]-Castor Oil (VENELEX) OINT Ointment; - ; topical  Special Instructions: Apply to sacrum qshift & prn. Every Shift Day, Evening, Night  . bictegravir-emtricitabine-tenofovir AF (BIKTARVY) 50-200-25 MG TABS tablet Take 1 tablet by mouth daily.  . calcium carbonate (TUMS EX) 750 MG chewable tablet Chew 1 tablet by mouth 2 (two) times daily.  . Cholecalciferol (VITAMIN D3) 2000 UNITS TABS Take 2,000 mg by mouth daily.  . coal tar (NEUTROGENA T-GEL) 0.5 % shampoo Apply to scalp on shower days (Twice a week) for dandruff and dry scalp Wed., and Sat.  . docusate sodium (COLACE) 100 MG capsule Take 100 mg by mouth daily.  . Fluticasone-Salmeterol (ADVAIR) 100-50 MCG/DOSE AEPB Inhale 1 puff into the lungs daily.  . metoprolol tartrate (LOPRESSOR) 100 MG tablet Take 100 mg by mouth 2 (two) times daily.  . midodrine (PROAMATINE) 10 MG tablet Take 10 mg by mouth. Send medication with resident to be given at center. Once a day on Tues.,Thurs., Sat.  . multivitamin (RENA-VIT) TABS tablet Take 1 tablet by mouth at bedtime.  . NON FORMULARY Diet Type:  Dysphagia 2 with thin liquids (NO Straws)  . Nutritional Supplements (FEEDING SUPPLEMENT, NEPRO CARB STEADY,) LIQD Take by mouth daily.  Marland Kitchen omeprazole (PRILOSEC) 20 MG capsule Take 20 mg by mouth at bedtime.  . senna-docusate (SENEXON-S) 8.6-50 MG tablet Take 1 tablet by mouth daily.  Marland Kitchen UNABLE TO FIND FLUID RESTRICTION 1200 CC/ 24hrs. Breakfast (12oz) 360 ml lunch (8oz) 240 ml Dinner (12oz) 360 ml Every Shift Day, Evening, Night  . warfarin  (COUMADIN) 1 MG tablet Take 1 mg by mouth daily. Give along with 2.5 mg tab for total of 3.5 daily  . warfarin (COUMADIN) 2.5 MG tablet Take 2.5 mg by mouth daily. Give along with 1 mg tab for total of 3.5 mg daily  . ZINC OXIDE EX Apply Zinc oxide to bilateral buttocks area for irritation. Every Shift Day, Day, Day   No facility-administered encounter medications on file as of 03/27/2019.      SIGNIFICANT DIAGNOSTIC EXAMS   PREVIOUS   07-07-18: US renal: Medical renal disease changes of both kidneys. Small renal cysts in both kidneys. No evidence of hydronephrosis.  07-11-18: ct of head:  1. Stable head CT, with no new acute intracranial abnormality. 2. In band cerebral atrophy with chronic small vessel ischemic disease with multiple chronic ischemic infarcts as above. 3. Acute pan sinusitis, worsened from previous.  05-12-19 ct of abdomen and pelvis: Diverticulosis without diverticulitis. Bilateral renal cysts. Mild bibasilar atelectasis.  03-02-19: ct of head:  1. No acute cortically based infarct or acute intracranial hemorrhage identified. 2. Stable non contrast CT appearance of advanced chronic ischemic disease since February. 3.  ASPECTS 10 (chronic encephalomalacia).  03-02-19: ct angio of head and neck CTA head: 1. No intracranial large vessel occlusion. 2. Moderate focal stenosis within the P2 left PCA. 3. Mild focal stenosis within the right PCA at the P1-P2 junction. 4. Additional intracranial atherosclerotic disease without significant stenosis as described. CTA neck: 1. Atherosclerotic disease within the imaged aortic arch and major branch vessels of the neck as detailed and most notably as follows. 2. Approximately 60-70% stenosis of the proximal left ICA. 3. Less than 50% stenosis of the proximal right ICA. 4. Mild/moderate ostial stenosis of the dominant left vertebral artery. The bilateral vertebral arteries are otherwise patent within the neck without  significant stenosis. 5. Anasarca. Partially visualized bilateral pleural effusions. The right pleural effusion may be loculated. 6. Sequela  of renal osteodystrophy.  03-03-19" 2-d echo 1. Left ventricular ejection fraction, by visual estimation, is 15-20%. The left ventricle has severely decreased function. Moderately increased left ventricular size. There is mildly increased left ventricular hypertrophy. There is global hypokinesis  with some regional variation.  2. Left ventricular diastolic Doppler parameters are consistent with impaired relaxation pattern of LV diastolic filling.  3. Global right ventricle has mildly reduced systolic function.The right ventricular size is normal. No increase in right ventricular wall thickness.  4. Left atrial size was mildly dilated.  5. Right atrial size was moderately dilated.  6. The mitral valve is grossly normal. Mild mitral valve regurgitation.  7. The tricuspid valve is grossly normal. Tricuspid valve regurgitation moderate.  8. The pulmonic valve was grossly normal. Pulmonic valve regurgitation is trivial by color flow Doppler.  9. Mildly elevated pulmonary artery systolic pressure. 10. A ICD wire is visualized. 11. The inferior vena cava is dilated in size with <50% respiratory variability, suggesting right atrial pressure of 15 mmHg. 12. Bioprosthesis in aortic position (unknown size and type). No perivalvular leak. Acoustic shadowing noted in association with leaflet motion - unable to completely exclude vegetation based on images. If clinically indicated and feasible, could  consider TEE. 13. Aortic valve regurgitation was not visualized by color flow Doppler. 14. Aortic valve mean gradient measures 10.7 mmHg. 15. The tricuspid regurgitant velocity is 2.17 m/s, and with an assumed right atrial pressure of 15 mmHg, the estimated right ventricular systolic pressure is mildly elevated at 33.8 mmHg.   03-05-19: EEG: This recording of awake and  drowsy states shows mild global slowing indicating a mild global encephalopathy. However, no epileptiform activities are documented.   03-05-19; MRI of brain: No acute infarct Atrophy with extensive chronic ischemic changes.  03-07-19: ct of abdomen and pelvis:  1. Interval large amount of free peritoneal fluid in the abdomen and pelvis. 2. Interval diffuse subcutaneous edema. 3. Small to moderate-sized right pleural effusion and small left pleural effusion. 4. Mild increase in prominence of the interstitial markings, compatible with interstitial pulmonary edema. 5. Stable possible early changes of cirrhosis of the liver. 6. Colonic diverticulosis. 7. Bilateral L5 spondylolysis with minimal spondylolisthesis at the L5-S1 level. 8. 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 9. Stable cardiomegaly and dense calcific coronary artery and aortic atherosclerosis.  03/14/2019: paracentesis: Successful ultrasound-guided paracentesis yielding 3.9 liters of peritoneal fluid.  PREVIOUS   LABS REVIEWED PREVIOUS:   05-22-18: hgb a1c 6.5 chol 100 ldl 43; trig 170; hdl 23 07-04-18: wbc  3.7; hgb 14.0; hct 45.6; mcv 91.8; plt 124; glucose 100; bun 45; creat 2.44; k+ 4.7; na++ 139 BNP 679.0  07-06-18: wbc 1.9; hgb 15.2; hct 48.8; mcv 91.2 plt 107; glucose 136; bun 43; creat 3.01; k+ 5.9; na++ 136; ca 8.8 ast 70 alt 46; albumin 3.0 BNP 554.5   blood culture: no growth /e-coli  07-08-18: wbc 13.8; hgb 13.1; hct 41.1; mcv 93.2 plt 73 glucose 108 ;bun 74; creat 5.32; k+ 5.5; na++ 139; ca 7.5; ast 80 alt 117; albumin 2.3  07-09-18; tsh 3.098 07-17-18: wbc 3.1; hgb 1.8; hct 37.3; mcv 89.9 plt 103 glucose 114; bun 95; creat 6.00; k+ 3.5; na++ 139; ca 6.4 mag 2.1 07-27-18: INR 2.3: coumadin 5 mg daily   07-30-18: wbc  6.8; hgb 10.7; hct 33.6; mcv 89.6; plt 94 glucose 90; bun 93; creat 7.47; k+ 4.2; na++ 135; ca 7.1 08-02-18: INR 1.9   08-15-18:  HIV quant: <20 CD4: 360  08-16-18: INR 3.2   08-23-18: INR  1.7 09-04-18: hgb 7.4 hct 23.6  glucose 90; bun 16; create 2.64; k+ 2.8; na++ 138; ca 8.3 phos 3.0; albumin 2.9 iron 52; tibc 218; PTH 124 vit 69.3  09-06-18: INR 2.5 guaiac neg  09-07-18: glucose 99; bun 22; creat 3.24; k+ 3.4;na++ 137; ca 8.4  09-11-18 hgb 10.3; hct 33.9 glucose 83; bun 18; creat 2.48; k+ 3.8; na++ 140; ca 8.9 iron 43; tibc 225 PTH 115 vit D 44.6 09-20-18: INR 2.1 09-22-18: guaiac: neg  10-13-18: INR 2.4 10-19-18: INR 2.1 11-02-18: INR 2.2  11-16-18: INR 3.8 11-19-18: INR 2.2 11-26-18: INR 1.6  12-05-18: hgb a1c 6.1 12-10-18: INR 1.9 12-18-18: INR 2.2  01-01-19: INR 2.3  01-10-19: wbc 2.3; hgb 12.1; hct 37.5; mcv 96.9; plt 101; INR 3.0 01-15-19: wbc 2.0; hgb 12.8; hct 39.5; mcv 96.1; plt 100; glucose 126; bun 40; creat 5.45; k+ 3.8; na++ 136; ca 8.8; liver normal albumin 3.2; PSA 0.98; INR 3.9 01-22-19; INR 3.1  01-29-19: INR 2.7 02-12-19: INR 4.6  02-15-19: INR 2.9  02-26-19: INR 1.9  03-02-19: wbc 2.4; hgb 12.4; hct 36.3; mcv 92.6; plt 79; glucose 106; bun 19; creat 2.53; k+ 3.1; na++ 136; ca 8.3; ast 47 alt 45; alk phos 161; total bili 1.8; albumin 2.9; INR 2.0 03-03-19: chol 113; ldl 68; trig 71; hdl 31 03-04-19: wbc 2.7; hgb 12.7; hct 38.2; mcv 95.0 plt 79; glucose 80; bun 30; creat 3.96; k+ 4.0; na++ 138; ca 8.8; mag 2.1; phos 3.9 03/06/2019: wbc 2.7; hgb 11.5; hct 32.9; mcv 90.9 ;plt 68; glucose 120; bun 23; creat 3.41; k+ 3.4; na++ 134; ca 8.8  03-12-19: glucose 100 bun 37; creat 5.01 ;k+ 3.8; na++ 133 ca 8.6; total bili 1.3 albumin 2.9 INR: 2.2 CD4 t cell: 135; HIV RNA quant: <20; RPR: neg   TODAY  03-27-19: INR 6.0    Review of Systems  Unable to perform ROS: Dementia (unable to participate )    Physical Exam Constitutional:      General: He is not in acute distress.    Appearance: He is well-developed. He is not diaphoretic.  Neck:     Musculoskeletal: Neck supple.     Thyroid: No thyromegaly.  Cardiovascular:     Rate and Rhythm: Normal rate. Rhythm  irregular.     Pulses: Normal pulses.     Heart sounds: Murmur present.     Comments: 2/6 ICD present  Pulmonary:     Effort: Pulmonary effort is normal. No respiratory distress.     Breath sounds: Normal breath sounds.  Abdominal:     General: Bowel sounds are normal. There is no distension.     Palpations: Abdomen is soft.     Tenderness: There is no abdominal tenderness.  Musculoskeletal:     Right lower leg: No edema.     Left lower leg: No edema.     Comments: Is able to move all extremities   Lymphadenopathy:     Cervical: No cervical adenopathy.  Skin:    General: Skin is warm and dry.     Comments: Left upper arm a/v fistula: + thrill + bruit    Neurological:     Mental Status: He is alert. Mental status is at baseline.  Psychiatric:        Mood and Affect: Mood normal.     ASSESSMENT/ PLAN:  TODAY;   1. PAF(paroxsymal atrial fibrillation) 2. Cerebrovascular accident (  CVA) unspecified mechanism 3. Chronic anticoagulation  For INR 6.0 will stop coumadin today and will repeat INR in the AM      MD is aware of resident's narcotic use and is in agreement with current plan of care. We will attempt to wean resident as appropriate.  Ok Edwards NP [facility]  Contact [phone] Monday through Friday 8am- 5pm  After hours call [phone]

## 2019-03-29 ENCOUNTER — Other Ambulatory Visit (HOSPITAL_COMMUNITY)
Admission: RE | Admit: 2019-03-29 | Discharge: 2019-03-29 | Disposition: A | Discharge status: Home / Self Care | Payer: Medicare HMO | Source: Skilled Nursing Facility | Attending: Adult Health | Admitting: Adult Health

## 2019-03-29 DIAGNOSIS — Z7901 Long term (current) use of anticoagulants: Secondary | ICD-10-CM | POA: Insufficient documentation

## 2019-03-29 LAB — PROTIME-INR
INR: 6.8 (ref 0.8–1.2)
Prothrombin Time: 57.6 seconds — ABNORMAL HIGH (ref 11.4–15.2)

## 2019-03-31 DEATH — deceased

## 2019-04-01 ENCOUNTER — Encounter: Payer: Self-pay | Admitting: Adult Health

## 2019-04-01 ENCOUNTER — Encounter (HOSPITAL_COMMUNITY)
Admission: AD | Admit: 2019-04-01 | Discharge: 2019-04-01 | Disposition: A | Discharge status: Home / Self Care | Payer: Medicare HMO | Source: Skilled Nursing Facility | Attending: Internal Medicine | Admitting: Internal Medicine

## 2019-04-01 ENCOUNTER — Non-Acute Institutional Stay (SKILLED_NURSING_FACILITY): Payer: Medicare HMO | Admitting: Adult Health

## 2019-04-01 DIAGNOSIS — Z7901 Long term (current) use of anticoagulants: Secondary | ICD-10-CM

## 2019-04-01 DIAGNOSIS — D631 Anemia in chronic kidney disease: Secondary | ICD-10-CM

## 2019-04-01 DIAGNOSIS — I132 Hypertensive heart and chronic kidney disease with heart failure and with stage 5 chronic kidney disease, or end stage renal disease: Secondary | ICD-10-CM | POA: Insufficient documentation

## 2019-04-01 DIAGNOSIS — I2510 Atherosclerotic heart disease of native coronary artery without angina pectoris: Secondary | ICD-10-CM | POA: Diagnosis not present

## 2019-04-01 DIAGNOSIS — N186 End stage renal disease: Secondary | ICD-10-CM

## 2019-04-01 DIAGNOSIS — A419 Sepsis, unspecified organism: Secondary | ICD-10-CM | POA: Insufficient documentation

## 2019-04-01 DIAGNOSIS — I480 Paroxysmal atrial fibrillation: Secondary | ICD-10-CM | POA: Diagnosis not present

## 2019-04-01 LAB — PROTIME-INR
INR: 6.8 (ref 0.8–1.2)
Prothrombin Time: 57.5 seconds — ABNORMAL HIGH (ref 11.4–15.2)

## 2019-04-01 NOTE — Progress Notes (Signed)
Location:    [facility] Room Number: 111/W Place of Service:  SNF (31)   CODE STATUS: DNR  No Known Allergies  Chief Complaint  Patient presents with  . 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 PAF (paroxysmal atrial fibrillation)   Chronic anticoagulation  Weekly follow up for the first 30 days post hospitalization.      HPI:  He is a 68 year old long term resident of this facility being seen for the management of his chronic illnesses: anemia; cad; afib. He is on chronic coumadin therapy. His INR today is 6.8.  His coumadin has been on hold since 03-27-19. He has not received any coumadin. I have spoken with nephrology and Dr. Sheppard Coil regarding his status. He cannot continue on coumadin therapy. After speaking. We have decided to give him vitamin k to lower his INR and then to begin him on eliquis. We need to have a family care plan meeting regarding his overall status. There are no reports of uncontrolled pain. There are no reports of excessive bruising or abnormal bleeding.   Past Medical History:  Diagnosis Date  . AICD (automatic cardioverter/defibrillator) present   . Alcoholism in remission ([place])   . Anemia   . Aortic insufficiency   . Arthritis   . At moderate risk for fall   . Bell's palsy   . Chronic systolic heart failure (HCC)    a. EF previously 15-20% with cath in 2014 showing no significant CAD b. EF 20-25% by echo in 06/2018  . CKD (chronic kidney disease), stage IV ([place])   . COPD (chronic obstructive pulmonary disease) ([place])   . Essential hypertension, benign   . HIV disease ([place]) 09/06/2016  . Hyperlipidemia   . NICM (nonischemic cardiomyopathy) ([place])   . Noncompliance with medication regimen   . NSVT (nonsustained ventricular tachycardia) ([place])   . Numbness of right jaw    Had a stoke in 01/2013. Numbness is occasional, especially  when trying to chew.  . OSA (obstructive sleep apnea)   . Productive cough 08/2013   With brown sputum   . S/P aortic valve replacement with bioprosthetic valve   . Stroke ([place]) 01/2013   weakness of right side from CVA  . Type 2 diabetes mellitus ([place])   . 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 Uses hearing aid 2014   recently received new hearing aids    Past Surgical History:  Procedure Laterality Date  . AV FISTULA PLACEMENT Left 07/20/2018   Procedure: INSERTION ARTERIOVENOUS GORTEX GRAFT  LEFT ARM;  Surgeon: Rosetta Posner, MD;  Location: [facility];  Service: Vascular;  Laterality: Left;  . BIOPSY N/A 04/10/2014   Procedure: GASTRIC BIOPSY;  Surgeon: Daneil Dolin, MD;  Location: [facility];  Service: Endoscopy;  Laterality: N/A;  . BIOPSY  10/12/2015   Procedure: BIOPSY;  Surgeon: Daneil Dolin, MD;  [facility];  Service: Endoscopy;;  stomach bx's  . CARDIAC DEFIBRILLATOR PLACEMENT  11/12/12   [employer] Inogen MINI ICD implanted in [place] at [place] [facility] per Dr Nelly Laurence note  . COLONOSCOPY WITH PROPOFOL N/A 04/10/2014   RMR: Colonic Diverticulosis  . ESOPHAGOGASTRODUODENOSCOPY (EGD) WITH PROPOFOL N/A 04/10/2014  RMR: Mild erosive reflux esophagitis. Multiple antral polyps likely hyperplastic status post removal by hot snare cautery technique. Diffusely abnormal stomach status post gastric biopsy I suspect some of patients anemaia may be due to intermittent oozing from the stomach. It would be difficult and a risky proposition to attempt complete removal of all of his gastric polyps.  . ESOPHAGOGASTRODUODENOSCOPY (EGD) WITH PROPOFOL N/A 10/12/2015   Procedure: ESOPHAGOGASTRODUODENOSCOPY (EGD) WITH PROPOFOL;  Surgeon: Daneil Dolin, MD;  [facility];  Service: Endoscopy;  Laterality: N/A;  [id number] - moved to 9:15  . INSERTION OF DIALYSIS CATHETER  Right 07/20/2018   Procedure: EXCHANGE OF DIALYSIS CATHETER RIGHT INTERNAL JUGULAR;  Surgeon: Rosetta Posner, MD;  Location: [facility];  Service: Vascular;  Laterality: Right;  . IR FLUORO GUIDE CV LINE RIGHT  07/09/2018  . IR US GUIDE VASC ACCESS RIGHT  07/09/2018  . POLYPECTOMY N/A 04/10/2014   Procedure: GASTRIC POLYPECTOMY;  Surgeon: Daneil Dolin, MD;  Location: [facility];  Service: Endoscopy;  Laterality: N/A;  . RIGHT HEART CATHETERIZATION N/A 02/06/2014   Procedure: RIGHT HEART CATH;  Surgeon: Larey Dresser, MD;  Location: [facility] CATH LAB;  Service: Cardiovascular;  Laterality: N/A;    Social History   Socioeconomic History  . Marital status: Single    Spouse name: Not on file  . Number of children: Not on file  . Years of education: Not on file  . Highest education level: Not on file  Occupational History  . Occupation: disabled  Social Needs  . Financial resource strain: Not hard at all  . Food insecurity    Worry: Never true    Inability: Never true  . Transportation needs    Medical: No    Non-medical: No  Tobacco Use  . Smoking status: Former Smoker    Types: Cigarettes    Quit date: 11/05/1993    Years since quitting: 25.4  . Smokeless tobacco: Never Used  Substance and Sexual Activity  . Alcohol use: Not Currently    Comment: former heavy ETOH, none recently  . Drug use: No    Comment: prior history of cocaine use, smoking   . Sexual activity: Yes    Birth control/protection: None  Lifestyle  . Physical activity    Days per week: 0 days    Minutes per session: 0 min  . Stress: Not at all  Relationships  . Social Herbalist on phone: Never    Gets together: Once a week    Attends religious service: Never    Active member of club or organization: No    Attends meetings of clubs or organizations: Never    Relationship status: Never married  . Intimate partner violence    Fear of current or ex partner: No    Emotionally abused: No    Physically abused:  No    Forced sexual activity: No  Other Topics Concern  . Not on file  Social History Narrative    Has h/o polysubstance abuse.  Baseline    2019- [facility] of [facility]. Unable to ambulate unassisted.            Family History  Problem Relation Age of Onset  . Kidney disease Mother   . Kidney disease Sister   . Colon cancer Neg Hx       VITAL SIGNS BP 100/65   Pulse 66   Temp (!) 97.2 F (36.2 C) (Oral)   Resp 18  Ht _0  (1.6 m)   Wt 140 lb (63.5 kg)   SpO2 94%   BMI 24.80 kg/m   Outpatient Encounter Medications as of 04/01/2019  Medication Sig  . acetaminophen (TYLENOL) 325 MG tablet Take 2 tablets (650 mg total) by mouth every 4 (four) hours as needed for mild pain (or temp > 37.5 C (99.5 F)).  Marland Kitchen albuterol (PROVENTIL) (2.5 MG/3ML) 0.083% nebulizer solution Take 3 mLs (2.5 mg total) by nebulization every 4 (four) hours as needed for wheezing or shortness of breath.  Marland Kitchen atorvastatin (LIPITOR) 80 MG tablet Take 80 mg by mouth daily.   Roseanne Kaufman [place]-Castor Oil (VENELEX) OINT Ointment; - ; topical  Special Instructions: Apply to sacrum qshift & prn. Every Shift Day, Evening, Night  . bictegravir-emtricitabine-tenofovir AF (BIKTARVY) 50-200-25 MG TABS tablet Take 1 tablet by mouth daily.  . calcium carbonate (TUMS EX) 750 MG chewable tablet Chew 1 tablet by mouth 2 (two) times daily.  . Cholecalciferol (VITAMIN D3) 2000 UNITS TABS Take 2,000 mg by mouth daily.  . coal tar (NEUTROGENA T-GEL) 0.5 % shampoo Apply to scalp on shower days (Twice a week) for dandruff and dry scalp Wed., and Sat.  . docusate sodium (COLACE) 100 MG capsule Take 100 mg by mouth daily.  . Fluticasone-Salmeterol (ADVAIR) 100-50 MCG/DOSE AEPB Inhale 1 puff into the lungs daily.  . metoprolol tartrate (LOPRESSOR) 100 MG tablet Take 100 mg by mouth 2 (two) times daily.  . midodrine (PROAMATINE) 10 MG tablet Take 10 mg by mouth. Send medication with resident to be given at center. Once a day  on Tues.,Thurs., Sat.  . multivitamin (RENA-VIT) TABS tablet Take 1 tablet by mouth at bedtime.  . NON FORMULARY Diet Type:  Dysphagia 2 with thin liquids (NO Straws)  . Nutritional Supplements (FEEDING SUPPLEMENT, NEPRO CARB STEADY,) LIQD Take by mouth daily.  Marland Kitchen omeprazole (PRILOSEC) 20 MG capsule Take 20 mg by mouth at bedtime.  . senna-docusate (SENEXON-S) 8.6-50 MG tablet Take 1 tablet by mouth daily.  Marland Kitchen UNABLE TO FIND FLUID RESTRICTION 1200 CC/ 24hrs. Breakfast (12oz) 360 ml lunch (8oz) 240 ml Dinner (12oz) 360 ml Every Shift Day, Evening, Night  . ZINC OXIDE EX Apply Zinc oxide to bilateral buttocks area for irritation. Every Shift Day, Day, Day  . [DISCONTINUED] warfarin (COUMADIN) 1 MG tablet Take 1 mg by mouth daily. Give along with 2.5 mg tab for total of 3.5 daily  . [DISCONTINUED] warfarin (COUMADIN) 2.5 MG tablet Take 2.5 mg by mouth daily. Give along with 1 mg tab for total of 3.5 mg daily   No facility-administered encounter medications on file as of 04/01/2019.      SIGNIFICANT DIAGNOSTIC EXAMS   PREVIOUS   07-07-18: US renal: Medical renal disease changes of both kidneys. Small renal cysts in both kidneys. No evidence of hydronephrosis.  07-11-18: ct of head:  1. Stable head CT, with no new acute intracranial abnormality. 2. In band cerebral atrophy with chronic small vessel ischemic disease with multiple chronic ischemic infarcts as above. 3. Acute pan sinusitis, worsened from previous.  05-12-19 ct of abdomen and pelvis: Diverticulosis without diverticulitis. Bilateral renal cysts. Mild bibasilar atelectasis.  03-02-19: ct of head:  1. No acute cortically based infarct or acute intracranial hemorrhage identified. 2. Stable non contrast CT appearance of advanced chronic ischemic disease since February. 3.  ASPECTS 10 (chronic encephalomalacia).  03-02-19: ct angio of head and neck CTA head: 1. No intracranial large vessel occlusion. 2. Moderate focal  stenosis within the P2 left PCA. 3. Mild focal stenosis within the right PCA at the P1-P2 junction. 4. Additional intracranial atherosclerotic disease without significant stenosis as described. CTA neck: 1. Atherosclerotic disease within the imaged aortic arch and major branch vessels of the neck as detailed and most notably as follows. 2. Approximately 60-70% stenosis of the proximal left ICA. 3. Less than 50% stenosis of the proximal right ICA. 4. Mild/moderate ostial stenosis of the dominant left vertebral artery. The bilateral vertebral arteries are otherwise patent within the neck without significant stenosis. 5. Anasarca. Partially visualized bilateral pleural effusions. The right pleural effusion may be loculated. 6. Sequela of renal osteodystrophy.  03-03-19" 2-d echo 1. Left ventricular ejection fraction, by visual estimation, is 15-20%. The left ventricle has severely decreased function. Moderately increased left ventricular size. There is mildly increased left ventricular hypertrophy. There is global hypokinesis  with some regional variation.  2. Left ventricular diastolic Doppler parameters are consistent with impaired relaxation pattern of LV diastolic filling.  3. Global right ventricle has mildly reduced systolic function.The right ventricular size is normal. No increase in right ventricular wall thickness.  4. Left atrial size was mildly dilated.  5. Right atrial size was moderately dilated.  6. The mitral valve is grossly normal. Mild mitral valve regurgitation.  7. The tricuspid valve is grossly normal. Tricuspid valve regurgitation moderate.  8. The pulmonic valve was grossly normal. Pulmonic valve regurgitation is trivial by color flow Doppler.  9. Mildly elevated pulmonary artery systolic pressure. 10. A ICD wire is visualized. 11. The inferior vena cava is dilated in size with <50% respiratory variability, suggesting right atrial pressure of 15 mmHg. 12. Bioprosthesis in  aortic position (unknown size and type). No perivalvular leak. Acoustic shadowing noted in association with leaflet motion - unable to completely exclude vegetation based on images. If clinically indicated and feasible, could  consider TEE. 13. Aortic valve regurgitation was not visualized by color flow Doppler. 14. Aortic valve mean gradient measures 10.7 mmHg. 15. The tricuspid regurgitant velocity is 2.17 m/s, and with an assumed right atrial pressure of 15 mmHg, the estimated right ventricular systolic pressure is mildly elevated at 33.8 mmHg.   03-05-19: EEG: This recording of awake and drowsy states shows mild global slowing indicating a mild global encephalopathy. However, no epileptiform activities are documented.   03-05-19; MRI of brain: No acute infarct Atrophy with extensive chronic ischemic changes.  03-07-19: ct of abdomen and pelvis:  1. Interval large amount of free peritoneal fluid in the abdomen and pelvis. 2. Interval diffuse subcutaneous edema. 3. Small to moderate-sized right pleural effusion and small left pleural effusion. 4. Mild increase in prominence of the interstitial markings, compatible with interstitial pulmonary edema. 5. Stable possible early changes of cirrhosis of the liver. 6. Colonic diverticulosis. 7. Bilateral L5 spondylolysis with minimal spondylolisthesis at the L5-S1 level. 8. 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 9. Stable cardiomegaly and dense calcific coronary artery and aortic atherosclerosis.  03/09/2019: paracentesis: Successful ultrasound-guided paracentesis yielding 3.9 liters of peritoneal fluid.  PREVIOUS   LABS REVIEWED PREVIOUS:   05-22-18: hgb a1c 6.5 chol 100 ldl 43; trig 170; hdl 23 07-04-18: wbc  3.7; hgb 14.0; hct 45.6; mcv 91.8; plt 124; glucose 100; bun 45; creat 2.44; k+ 4.7; na++ 139 BNP 679.0  07-06-18: wbc 1.9; hgb 15.2; hct 48.8; mcv 91.2 plt 107; glucose 136; bun 43; creat 3.01; k+ 5.9;  na++ 136; ca 8.8 ast 70  alt 46; albumin 3.0 BNP 554.5   blood culture: no growth /e-coli  07-08-18: wbc 13.8; hgb 13.1; hct 41.1; mcv 93.2 plt 73 glucose 108 ;bun 74; creat 5.32; k+ 5.5; na++ 139; ca 7.5; ast 80 alt 117; albumin 2.3  07-09-18; tsh 3.098 07-17-18: wbc 3.1; hgb 1.8; hct 37.3; mcv 89.9 plt 103 glucose 114; bun 95; creat 6.00; k+ 3.5; na++ 139; ca 6.4 mag 2.1 07-27-18: INR 2.3: coumadin 5 mg daily   07-30-18: wbc  6.8; hgb 10.7; hct 33.6; mcv 89.6; plt 94 glucose 90; bun 93; creat 7.47; k+ 4.2; na++ 135; ca 7.1 08-02-18: INR 1.9   08-15-18: HIV quant: <20 CD4: 360  08-16-18: INR 3.2  08-23-18: INR  1.7 09-04-18: hgb 7.4 hct 23.6  glucose 90; bun 16; create 2.64; k+ 2.8; na++ 138; ca 8.3 phos 3.0; albumin 2.9 iron 52; tibc 218; PTH 124 vit 69.3  09-06-18: INR 2.5 guaiac neg  09-07-18: glucose 99; bun 22; creat 3.24; k+ 3.4;na++ 137; ca 8.4  09-11-18 hgb 10.3; hct 33.9 glucose 83; bun 18; creat 2.48; k+ 3.8; na++ 140; ca 8.9 iron 43; tibc 225 PTH 115 vit D 44.6 09-20-18: INR 2.1 09-22-18: guaiac: neg  10-13-18: INR 2.4 10-19-18: INR 2.1 11-02-18: INR 2.2  11-16-18: INR 3.8 11-19-18: INR 2.2 11-26-18: INR 1.6  12-05-18: hgb a1c 6.1 12-10-18: INR 1.9 12-18-18: INR 2.2  01-01-19: INR 2.3  01-10-19: wbc 2.3; hgb 12.1; hct 37.5; mcv 96.9; plt 101; INR 3.0 01-15-19: wbc 2.0; hgb 12.8; hct 39.5; mcv 96.1; plt 100; glucose 126; bun 40; creat 5.45; k+ 3.8; na++ 136; ca 8.8; liver normal albumin 3.2; PSA 0.98; INR 3.9 01-22-19; INR 3.1  01-29-19: INR 2.7 02-12-19: INR 4.6  02-15-19: INR 2.9  02-26-19: INR 1.9  03-02-19: wbc 2.4; hgb 12.4; hct 36.3; mcv 92.6; plt 79; glucose 106; bun 19; creat 2.53; k+ 3.1; na++ 136; ca 8.3; ast 47 alt 45; alk phos 161; total bili 1.8; albumin 2.9; INR 2.0 03-03-19: chol 113; ldl 68; trig 71; hdl 31 03-04-19: wbc 2.7; hgb 12.7; hct 38.2; mcv 95.0 plt 79; glucose 80; bun 30; creat 3.96; k+ 4.0; na++ 138; ca 8.8; mag 2.1; phos 3.9 03/05/2019: wbc 2.7; hgb 11.5; hct 32.9; mcv 90.9 ;plt 68;  glucose 120; bun 23; creat 3.41; k+ 3.4; na++ 134; ca 8.8  03-12-19: glucose 100 bun 37; creat 5.01 ;k+ 3.8; na++ 133 ca 8.6; total bili 1.3 albumin 2.9 INR: 2.2 CD4 t cell: 135; HIV RNA quant: <20; RPR: neg   TODAY  03-27-19: INR 6.0  03-29-19: INR 6.8 04-01-19: INR 6.8    Review of Systems  Unable to perform ROS: Dementia (unable to participate )    Physical Exam Constitutional:      General: He is not in acute distress.    Appearance: He is well-developed. He is not diaphoretic.  Neck:     Musculoskeletal: Neck supple.     Thyroid: No thyromegaly.  Cardiovascular:     Rate and Rhythm: Normal rate. Rhythm irregular.     Pulses: Normal pulses.     Heart sounds: Murmur present.     Comments: 2/6 ICD present  Pulmonary:     Effort: Pulmonary effort is normal. No respiratory distress.     Breath sounds: Normal breath sounds.  Abdominal:     General: Bowel sounds are normal. There is no distension.     Palpations: Abdomen is soft.     Tenderness: There is no abdominal  tenderness.  Musculoskeletal:     Right lower leg: No edema.     Left lower leg: No edema.     Comments: Is able to move all extremities   Lymphadenopathy:     Cervical: No cervical adenopathy.  Skin:    General: Skin is warm and dry.     Comments: Left upper arm a/v fistula: + thrill + bruit     Neurological:     Mental Status: He is alert. Mental status is at baseline.  Psychiatric:        Mood and Affect: Mood normal.      ASSESSMENT/ PLAN:  TODAY:   1. Anemia due to end stage renal disease is stable hgb 11.5 will monitor   2.  CAD of native artery of native heart without angina: is stable is on lopressor 100 mg twice daily and will monitor his status.   3. PAF (paroxysmal atrial fibrillation) status post bioprosthetic avr: heart rate stable will continue lopressor 100 mg twice daily for rate control  4.  Chronic anticoagulation: his INR remains a 6.8; after speaking with both DR. Colluru and  Dr. Sheppard Coil will stop coumadin; will give vit K 2.5 mg today will recheck INR in the AM and will begin him on eliquis 2.5 mg twice daily will monitor his status.   Will setup care plan meeting with family.   PREVIOUS  5.  Other emphysema is stable will continue albuterol neb every 4 hours as needed will continue advair 100/50 1 puff daily   6. HIV disease without change will continue bikatarvy 20-200-25 mg daily   7. CVA/Multiple CVA; is neurologically stable will monitor   8. 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 Protein calorie malnutrition severe; stable albumin 2.9 will continue supplements as directed weight is 138  pounds.   10. PBH with obstruction/urinary tract symptoms: is stable off flomax due to syncopal episode   11. GERD without esophagitis: is stable will continue prilosec 20 mg daily   12. Chronic constipation: is stable will continue colace and senna s daily    13. Vascular dementia without behavioral disturbance: is without change weight is 138 pounds will monitor   14. Syncope and collapse: is stable; more than likely due to EF. Will monitor his status.   15. 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 Hypertension associated with stage 5 chronic kidney disease due to type 2 diabetes mellitus: is stable b/p 118/68 will continue lopressor 100 mg twice daily   17. Dyslipidemia associated with type 2 diabetes mellitus: is stable LDL 68 will continue lipitor 80 mg daily   18. 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 Chronic kidney disease with end stage renal disease on  dialysis due to type 2 diabetes mellitus/dialysis dependent is stable is dialyzed 3 times weekly followed by nephrology is on 1200 cc fluid restriction midodrine 10 mg with dialysis; calcium 750 mg twice daily    MD is aware of resident's narcotic use and is in agreement with current plan of care. We will attempt to wean resident as appropriate.  Ok Edwards NP [facility]  Adult Medicine  Contact [phone] Monday through Friday 8am- 5pm  After hours call [phone]

## 2019-04-02 ENCOUNTER — Encounter: Payer: Self-pay | Admitting: Adult Health

## 2019-04-02 ENCOUNTER — Encounter (HOSPITAL_COMMUNITY)
Admission: RE | Admit: 2019-04-02 | Discharge: 2019-04-02 | Disposition: A | Discharge status: Home / Self Care | Payer: Medicare HMO | Source: Skilled Nursing Facility | Attending: Adult Health | Admitting: Adult Health

## 2019-04-02 ENCOUNTER — Non-Acute Institutional Stay (SKILLED_NURSING_FACILITY): Payer: Medicare HMO | Admitting: Adult Health

## 2019-04-02 DIAGNOSIS — Z7901 Long term (current) use of anticoagulants: Secondary | ICD-10-CM | POA: Diagnosis not present

## 2019-04-02 DIAGNOSIS — I639 Cerebral infarction, unspecified: Secondary | ICD-10-CM

## 2019-04-02 DIAGNOSIS — R41841 Cognitive communication deficit: Secondary | ICD-10-CM | POA: Diagnosis not present

## 2019-04-02 DIAGNOSIS — I480 Paroxysmal atrial fibrillation: Secondary | ICD-10-CM | POA: Diagnosis not present

## 2019-04-02 DIAGNOSIS — Z952 Presence of prosthetic heart valve: Secondary | ICD-10-CM | POA: Diagnosis not present

## 2019-04-02 DIAGNOSIS — I132 Hypertensive heart and chronic kidney disease with heart failure and with stage 5 chronic kidney disease, or end stage renal disease: Secondary | ICD-10-CM | POA: Insufficient documentation

## 2019-04-02 LAB — PROTIME-INR
INR: 5.1 (ref 0.8–1.2)
Prothrombin Time: 46.2 seconds — ABNORMAL HIGH (ref 11.4–15.2)

## 2019-04-02 LAB — ALBUMIN: Albumin: 2.4 g/dL — ABNORMAL LOW (ref 3.5–5.0)

## 2019-04-02 NOTE — Progress Notes (Signed)
Location:    [place] Room Number: 111/W Place of Service:  SNF (31)   CODE STATUS: DNR  No Known Allergies  Chief Complaint  Patient presents with  . Anticoagulation    INR    HPI:  His INR today is 5.1. his coumadin dose have been held since 03-27-19. He did receive vitamin K 2.5 mg yesterday. His albumin level is 2.4. there are no reports of uncontrolled pain. No reports of abnormal bleeding. No reports of excessive bruising. There are no reports of dietary changes.   Past Medical History:  Diagnosis Date  . AICD (automatic cardioverter/defibrillator) present   . Alcoholism in remission ([place])   . Anemia   . Aortic insufficiency   . Arthritis   . At moderate risk for fall   . Bell's palsy   . Chronic systolic heart failure (HCC)    a. EF previously 15-20% with cath in 2014 showing no significant CAD b. EF 20-25% by echo in 06/2018  . CKD (chronic kidney disease), stage IV ([place])   . COPD (chronic obstructive pulmonary disease) ([place])   . Essential hypertension, benign   . HIV disease ([place]) 09/06/2016  . Hyperlipidemia   . NICM (nonischemic cardiomyopathy) ([place])   . Noncompliance with medication regimen   . NSVT (nonsustained ventricular tachycardia) ([place])   . Numbness of right jaw    Had a stoke in 01/2013. Numbness is occasional, especially when trying to chew.  . OSA (obstructive sleep apnea)   . Productive cough 08/2013   With brown sputum   . S/P aortic valve replacement with bioprosthetic valve   . Stroke ([place]) 01/2013   weakness of right side from CVA  . Type 2 diabetes mellitus ([place])   . 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 Uses hearing aid 2014   recently received new hearing aids    Past Surgical History:  Procedure Laterality Date  . AV FISTULA PLACEMENT Left 07/20/2018   Procedure: INSERTION ARTERIOVENOUS GORTEX GRAFT  LEFT ARM;  Surgeon: Rosetta Posner, MD;   Location: [place];  Service: Vascular;  Laterality: Left;  . BIOPSY N/A 04/10/2014   Procedure: GASTRIC BIOPSY;  Surgeon: Daneil Dolin, MD;  Location: [facility];  Service: Endoscopy;  Laterality: N/A;  . BIOPSY  10/12/2015   Procedure: BIOPSY;  Surgeon: Daneil Dolin, MD;  Location[facility];  Service: Endoscopy;;  stomach bx's  . CARDIAC DEFIBRILLATOR PLACEMENT  11/12/12   [employer] Inogen MINI ICD implanted in [place] at [place] [facility] per Dr Nelly Laurence note  . COLONOSCOPY WITH PROPOFOL N/A 04/10/2014   RMR: Colonic Diverticulosis  . ESOPHAGOGASTRODUODENOSCOPY (EGD) WITH PROPOFOL N/A 04/10/2014   RMR: Mild erosive reflux esophagitis. Multiple antral polyps likely hyperplastic status post removal by hot snare cautery technique. Diffusely abnormal stomach status post gastric biopsy I suspect some of patients anemaia may be due to intermittent oozing from the stomach. It would be difficult and a risky proposition to attempt complete removal of all of his gastric polyps.  . ESOPHAGOGASTRODUODENOSCOPY (EGD) WITH PROPOFOL N/A 10/12/2015   Procedure: ESOPHAGOGASTRODUODENOSCOPY (EGD) WITH PROPOFOL;  Surgeon: Daneil Dolin, MD;  [facility];  Service: Endoscopy;  Laterality: N/A;  0539 - moved to 9:15  . INSERTION OF DIALYSIS CATHETER Right 07/20/2018   Procedure: EXCHANGE OF DIALYSIS CATHETER RIGHT INTERNAL JUGULAR;  Surgeon: Donnetta Hutching,  Arvilla Meres, MD;  Location: [place];  Service: Vascular;  Laterality: Right;  . IR FLUORO GUIDE CV LINE RIGHT  07/09/2018  . IR US GUIDE VASC ACCESS RIGHT  07/09/2018  . POLYPECTOMY N/A 04/10/2014   Procedure: GASTRIC POLYPECTOMY;  Surgeon: Daneil Dolin, MD;  Location: [facility];  Service: Endoscopy;  Laterality: N/A;  . RIGHT HEART CATHETERIZATION N/A 02/06/2014   Procedure: RIGHT HEART CATH;  Surgeon: Larey Dresser, MD;  Location: [facility] CATH LAB;  Service: Cardiovascular;  Laterality: N/A;    Social History    Socioeconomic History  . Marital status: Single    Spouse name: Not on file  . Number of children: Not on file  . Years of education: Not on file  . Highest education level: Not on file  Occupational History  . Occupation: disabled  Social Needs  . Financial resource strain: Not hard at all  . Food insecurity    Worry: Never true    Inability: Never true  . Transportation needs    Medical: No    Non-medical: No  Tobacco Use  . Smoking status: Former Smoker    Types: Cigarettes    Quit date: 11/05/1993    Years since quitting: 25.4  . Smokeless tobacco: Never Used  Substance and Sexual Activity  . Alcohol use: Not Currently    Comment: former heavy ETOH, none recently  . Drug use: No    Comment: prior history of cocaine use, smoking   . Sexual activity: Yes    Birth control/protection: None  Lifestyle  . Physical activity    Days per week: 0 days    Minutes per session: 0 min  . Stress: Not at all  Relationships  . Social Herbalist on phone: Never    Gets together: Once a week    Attends religious service: Never    Active member of club or organization: No    Attends meetings of clubs or organizations: Never    Relationship status: Never married  . Intimate partner violence    Fear of current or ex partner: No    Emotionally abused: No    Physically abused: No    Forced sexual activity: No  Other Topics Concern  . Not on file  Social History Narrative    Has h/o polysubstance abuse.  Baseline    2019- resident of [facility]. Unable to ambulate unassisted.            Family History  Problem Relation Age of Onset  . Kidney disease Mother   . Kidney disease Sister   . Colon cancer Neg Hx       VITAL SIGNS BP 100/65   Pulse 66   Temp 98 F (36.7 C) (Oral)   Resp 18   Ht '5\' 3"'$  (1.6 m)   Wt 140 lb (63.5 kg)   SpO2 94%   BMI 24.80 kg/m   Outpatient Encounter Medications as of 04/02/2019  Medication Sig  . acetaminophen (TYLENOL)  325 MG tablet Take 2 tablets (650 mg total) by mouth every 4 (four) hours as needed for mild pain (or temp > 37.5 C (99.5 F)).  Marland Kitchen albuterol (PROVENTIL) (2.5 MG/3ML) 0.083% nebulizer solution Take 3 mLs (2.5 mg total) by nebulization every 4 (four) hours as needed for wheezing or shortness of breath.  Marland Kitchen apixaban (ELIQUIS) 2.5 MG TABS tablet Take 2.5 mg by mouth 2 (two) times daily.  Marland Kitchen atorvastatin (LIPITOR) 80 MG tablet Take  80 mg by mouth daily.   Roseanne Kaufman [place]-Castor Oil (VENELEX) OINT Ointment; - ; topical  Special Instructions: Apply to sacrum qshift & prn. Every Shift Day, Evening, Night  . bictegravir-emtricitabine-tenofovir AF (BIKTARVY) 50-200-25 MG TABS tablet Take 1 tablet by mouth daily.  . calcium carbonate (TUMS EX) 750 MG chewable tablet Chew 1 tablet by mouth 2 (two) times daily.  . Cholecalciferol (VITAMIN D3) 2000 UNITS TABS Take 2,000 mg by mouth daily.  . coal tar (NEUTROGENA T-GEL) 0.5 % shampoo Apply to scalp on shower days (Twice a week) for dandruff and dry scalp Wed., and Sat.  . docusate sodium (COLACE) 100 MG capsule Take 100 mg by mouth daily.  . Fluticasone-Salmeterol (ADVAIR) 100-50 MCG/DOSE AEPB Inhale 1 puff into the lungs daily.  . metoprolol tartrate (LOPRESSOR) 100 MG tablet Take 100 mg by mouth 2 (two) times daily.  . midodrine (PROAMATINE) 10 MG tablet Take 10 mg by mouth. Send medication with resident to be given at center. Once a day on Tues.,Thurs., Sat.  . multivitamin (RENA-VIT) TABS tablet Take 1 tablet by mouth at bedtime.  . NON FORMULARY Diet Type:  Dysphagia 2 with thin liquids (NO Straws)  . Nutritional Supplements (FEEDING SUPPLEMENT, NEPRO CARB STEADY,) LIQD Take by mouth daily.  Marland Kitchen omeprazole (PRILOSEC) 20 MG capsule Take 20 mg by mouth at bedtime.  Marland Kitchen Phytonadione (VITAMIN K1 IJ) Inject 5 mg as directed once.  . senna-docusate (SENEXON-S) 8.6-50 MG tablet Take 1 tablet by mouth daily.  Marland Kitchen UNABLE TO FIND FLUID RESTRICTION 1200 CC/ 24hrs. Breakfast  (12oz) 360 ml lunch (8oz) 240 ml Dinner (12oz) 360 ml Every Shift Day, Evening, Night  . ZINC OXIDE EX Apply Zinc oxide to bilateral buttocks area for irritation. Every Shift Day, Day, Day   No facility-administered encounter medications on file as of 04/02/2019.      SIGNIFICANT DIAGNOSTIC EXAMS   PREVIOUS   07-07-18: US renal: Medical renal disease changes of both kidneys. Small renal cysts in both kidneys. No evidence of hydronephrosis.  07-11-18: ct of head:  1. Stable head CT, with no new acute intracranial abnormality. 2. In band cerebral atrophy with chronic small vessel ischemic disease with multiple chronic ischemic infarcts as above. 3. Acute pan sinusitis, worsened from previous.  05-12-19 ct of abdomen and pelvis: Diverticulosis without diverticulitis. Bilateral renal cysts. Mild bibasilar atelectasis.  03-02-19: ct of head:  1. No acute cortically based infarct or acute intracranial hemorrhage identified. 2. Stable non contrast CT appearance of advanced chronic ischemic disease since February. 3.  ASPECTS 10 (chronic encephalomalacia).  03-02-19: ct angio of head and neck CTA head: 1. No intracranial large vessel occlusion. 2. Moderate focal stenosis within the P2 left PCA. 3. Mild focal stenosis within the right PCA at the P1-P2 junction. 4. Additional intracranial atherosclerotic disease without significant stenosis as described. CTA neck: 1. Atherosclerotic disease within the imaged aortic arch and major branch vessels of the neck as detailed and most notably as follows. 2. Approximately 60-70% stenosis of the proximal left ICA. 3. Less than 50% stenosis of the proximal right ICA. 4. Mild/moderate ostial stenosis of the dominant left vertebral artery. The bilateral vertebral arteries are otherwise patent within the neck without significant stenosis. 5. Anasarca. Partially visualized bilateral pleural effusions. The right pleural effusion may be loculated.  6. Sequela of renal osteodystrophy.  03-03-19" 2-d echo 1. Left ventricular ejection fraction, by visual estimation, is 15-20%. The left ventricle has severely decreased function. Moderately increased left ventricular size.  There is mildly increased left ventricular hypertrophy. There is global hypokinesis  with some regional variation.  2. Left ventricular diastolic Doppler parameters are consistent with impaired relaxation pattern of LV diastolic filling.  3. Global right ventricle has mildly reduced systolic function.The right ventricular size is normal. No increase in right ventricular wall thickness.  4. Left atrial size was mildly dilated.  5. Right atrial size was moderately dilated.  6. The mitral valve is grossly normal. Mild mitral valve regurgitation.  7. The tricuspid valve is grossly normal. Tricuspid valve regurgitation moderate.  8. The pulmonic valve was grossly normal. Pulmonic valve regurgitation is trivial by color flow Doppler.  9. Mildly elevated pulmonary artery systolic pressure. 10. A ICD wire is visualized. 11. The inferior vena cava is dilated in size with <50% respiratory variability, suggesting right atrial pressure of 15 mmHg. 12. Bioprosthesis in aortic position (unknown size and type). No perivalvular leak. Acoustic shadowing noted in association with leaflet motion - unable to completely exclude vegetation based on images. If clinically indicated and feasible, could  consider TEE. 13. Aortic valve regurgitation was not visualized by color flow Doppler. 14. Aortic valve mean gradient measures 10.7 mmHg. 15. The tricuspid regurgitant velocity is 2.17 m/s, and with an assumed right atrial pressure of 15 mmHg, the estimated right ventricular systolic pressure is mildly elevated at 33.8 mmHg.   03-05-19: EEG: This recording of awake and drowsy states shows mild global slowing indicating a mild global encephalopathy. However, no epileptiform activities are documented.    03-05-19; MRI of brain: No acute infarct Atrophy with extensive chronic ischemic changes.  03-07-19: ct of abdomen and pelvis:  1. Interval large amount of free peritoneal fluid in the abdomen and pelvis. 2. Interval diffuse subcutaneous edema. 3. Small to moderate-sized right pleural effusion and small left pleural effusion. 4. Mild increase in prominence of the interstitial markings, compatible with interstitial pulmonary edema. 5. Stable possible early changes of cirrhosis of the liver. 6. Colonic diverticulosis. 7. Bilateral L5 spondylolysis with minimal spondylolisthesis at the L5-S1 level. 8. 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 9. Stable cardiomegaly and dense calcific coronary artery and aortic atherosclerosis.  03/25/2019: paracentesis: Successful ultrasound-guided paracentesis yielding 3.9 liters of peritoneal fluid.  PREVIOUS   LABS REVIEWED PREVIOUS:   05-22-18: hgb a1c 6.5 chol 100 ldl 43; trig 170; hdl 23 07-04-18: wbc  3.7; hgb 14.0; hct 45.6; mcv 91.8; plt 124; glucose 100; bun 45; creat 2.44; k+ 4.7; na++ 139 BNP 679.0  07-06-18: wbc 1.9; hgb 15.2; hct 48.8; mcv 91.2 plt 107; glucose 136; bun 43; creat 3.01; k+ 5.9; na++ 136; ca 8.8 ast 70 alt 46; albumin 3.0 BNP 554.5   blood culture: no growth /e-coli  07-08-18: wbc 13.8; hgb 13.1; hct 41.1; mcv 93.2 plt 73 glucose 108 ;bun 74; creat 5.32; k+ 5.5; na++ 139; ca 7.5; ast 80 alt 117; albumin 2.3  07-09-18; tsh 3.098 07-17-18: wbc 3.1; hgb 1.8; hct 37.3; mcv 89.9 plt 103 glucose 114; bun 95; creat 6.00; k+ 3.5; na++ 139; ca 6.4 mag 2.1 07-27-18: INR 2.3: coumadin 5 mg daily   07-30-18: wbc  6.8; hgb 10.7; hct 33.6; mcv 89.6; plt 94 glucose 90; bun 93; creat 7.47; k+ 4.2; na++ 135; ca 7.1 08-02-18: INR 1.9   08-15-18: HIV quant: <20 CD4: 360  08-16-18: INR 3.2  08-23-18: INR  1.7 09-04-18: hgb 7.4 hct 23.6  glucose 90; bun 16; create 2.64; k+ 2.8; na++  138; ca 8.3 phos 3.0; albumin 2.9 iron 52;  tibc 218; PTH 124 vit 69.3  09-06-18: INR 2.5 guaiac neg  09-07-18: glucose 99; bun 22; creat 3.24; k+ 3.4;na++ 137; ca 8.4  09-11-18 hgb 10.3; hct 33.9 glucose 83; bun 18; creat 2.48; k+ 3.8; na++ 140; ca 8.9 iron 43; tibc 225 PTH 115 vit D 44.6 09-20-18: INR 2.1 09-22-18: guaiac: neg  10-13-18: INR 2.4 10-19-18: INR 2.1 11-02-18: INR 2.2  11-16-18: INR 3.8 11-19-18: INR 2.2 11-26-18: INR 1.6  12-05-18: hgb a1c 6.1 12-10-18: INR 1.9 12-18-18: INR 2.2  01-01-19: INR 2.3  01-10-19: wbc 2.3; hgb 12.1; hct 37.5; mcv 96.9; plt 101; INR 3.0 01-15-19: wbc 2.0; hgb 12.8; hct 39.5; mcv 96.1; plt 100; glucose 126; bun 40; creat 5.45; k+ 3.8; na++ 136; ca 8.8; liver normal albumin 3.2; PSA 0.98; INR 3.9 01-22-19; INR 3.1  01-29-19: INR 2.7 02-12-19: INR 4.6  02-15-19: INR 2.9  02-26-19: INR 1.9  03-02-19: wbc 2.4; hgb 12.4; hct 36.3; mcv 92.6; plt 79; glucose 106; bun 19; creat 2.53; k+ 3.1; na++ 136; ca 8.3; ast 47 alt 45; alk phos 161; total bili 1.8; albumin 2.9; INR 2.0 03-03-19: chol 113; ldl 68; trig 71; hdl 31 03-04-19: wbc 2.7; hgb 12.7; hct 38.2; mcv 95.0 plt 79; glucose 80; bun 30; creat 3.96; k+ 4.0; na++ 138; ca 8.8; mag 2.1; phos 3.9 03/26/2019: wbc 2.7; hgb 11.5; hct 32.9; mcv 90.9 ;plt 68; glucose 120; bun 23; creat 3.41; k+ 3.4; na++ 134; ca 8.8  03-12-19: glucose 100 bun 37; creat 5.01 ;k+ 3.8; na++ 133 ca 8.6; total bili 1.3 albumin 2.9 INR: 2.2 CD4 t cell: 135; HIV RNA quant: <20; RPR: neg   TODAY 03-27-19: INR 6.0  03-29-19: INR 6.8 04-01-19: INR 6.8 04-02-19; INR 5.1; albumin 2.4     Review of Systems  Unable to perform ROS: Dementia (unable to participate )    Physical Exam Constitutional:      General: He is not in acute distress.    Appearance: He is well-developed. He is not diaphoretic.  Neck:     Musculoskeletal: Neck supple.     Thyroid: No thyromegaly.  Cardiovascular:     Rate and Rhythm: Normal rate. Rhythm irregular.     Pulses: Normal pulses.     Heart sounds: Murmur present.      Comments: 2/6 ICD present  Pulmonary:     Effort: Pulmonary effort is normal. No respiratory distress.     Breath sounds: Normal breath sounds.  Abdominal:     General: Bowel sounds are normal. There is no distension.     Palpations: Abdomen is soft.     Tenderness: There is no abdominal tenderness.  Musculoskeletal:     Right lower leg: No edema.     Left lower leg: No edema.     Comments: Is able to move all extremities   Lymphadenopathy:     Cervical: No cervical adenopathy.  Skin:    General: Skin is warm and dry.     Comments: Left upper arm a/v fistula: + thrill + bruit      Neurological:     Mental Status: He is alert. Mental status is at baseline.  Psychiatric:        Mood and Affect: Mood normal.      ASSESSMENT/ PLAN:  TODAY  1. PAF (paroxysmal atrial fibrillation) 2. Cerebral vascular accident (CVA) unspecified mechanism 3. S/p AVR (aortic valve replacement) 4. Chronic anticoagulation  Will give him vitamin K 5 mg now Will hold eliquis until INR <2 Will check INR in the AM Will monitor his status.     MD is aware of resident's narcotic use and is in agreement with current plan of care. We will attempt to wean resident as appropriate.  Ok Edwards NP [facility]  Contact [phone] Monday through Friday 8am- 5pm  After hours call [phone]

## 2019-04-03 ENCOUNTER — Non-Acute Institutional Stay (SKILLED_NURSING_FACILITY): Payer: Medicare HMO | Admitting: Adult Health

## 2019-04-03 ENCOUNTER — Encounter (HOSPITAL_COMMUNITY)
Admission: RE | Admit: 2019-04-03 | Discharge: 2019-04-03 | Disposition: A | Discharge status: Home / Self Care | Payer: Medicare HMO | Source: Skilled Nursing Facility | Attending: *Deleted | Admitting: *Deleted

## 2019-04-03 ENCOUNTER — Other Ambulatory Visit: Payer: Self-pay | Admitting: Adult Health

## 2019-04-03 ENCOUNTER — Encounter: Payer: Self-pay | Admitting: Adult Health

## 2019-04-03 DIAGNOSIS — I5042 Chronic combined systolic (congestive) and diastolic (congestive) heart failure: Secondary | ICD-10-CM | POA: Diagnosis not present

## 2019-04-03 DIAGNOSIS — N186 End stage renal disease: Secondary | ICD-10-CM

## 2019-04-03 DIAGNOSIS — Z7901 Long term (current) use of anticoagulants: Secondary | ICD-10-CM | POA: Diagnosis not present

## 2019-04-03 DIAGNOSIS — R627 Adult failure to thrive: Secondary | ICD-10-CM | POA: Insufficient documentation

## 2019-04-03 DIAGNOSIS — E1122 Type 2 diabetes mellitus with diabetic chronic kidney disease: Secondary | ICD-10-CM | POA: Diagnosis not present

## 2019-04-03 DIAGNOSIS — Z992 Dependence on renal dialysis: Secondary | ICD-10-CM

## 2019-04-03 DIAGNOSIS — F0150 Vascular dementia without behavioral disturbance: Secondary | ICD-10-CM | POA: Diagnosis not present

## 2019-04-03 DIAGNOSIS — I120 Hypertensive chronic kidney disease with stage 5 chronic kidney disease or end stage renal disease: Secondary | ICD-10-CM

## 2019-04-03 LAB — PROTIME-INR
INR: 3.1 — ABNORMAL HIGH (ref 0.8–1.2)
Prothrombin Time: 31.6 seconds — ABNORMAL HIGH (ref 11.4–15.2)

## 2019-04-03 MED ORDER — OXYCODONE HCL 20 MG/ML PO CONC: 5.0000 mg | ORAL | Status: DC | PRN

## 2019-04-03 MED ORDER — OXYCODONE HCL 20 MG/ML PO CONC: 5.0000 mg | ORAL | 0 refills | Status: AC | PRN

## 2019-04-03 NOTE — Progress Notes (Addendum)
Location:  [facility] [facility] Room Number: 111-W Place of Service:  SNF (31)   CODE STATUS: DNR  No Known Allergies  Chief Complaint  Patient presents with  . Advanced Directive    Care Plan Meeting    HPI:  We have come together for his care plan meeting does have family present. His EF is 10-15%. He is failing. His INR is 3.1. he has received a total of 7.5 mg vitamin K. His eliquis is on hold. He is unable to sit through a dialysis treatment due to incontinence. His appetite is variable. His weight at this time is stable. He is less engaging with those around him. We have updated the family on his overall poor prognosis.he has a poor quality of life. He is a DNR. The discussion surrounded stopping dialysis and to focus upon his comfort only. They will need to discuss this among themselves before making any decisions.   Past Medical History:  Diagnosis Date  . AICD (automatic cardioverter/defibrillator) present   . Alcoholism in remission ([place])   . Anemia   . Aortic insufficiency   . Arthritis   . At moderate risk for fall   . Bell's palsy   . Chronic systolic heart failure (HCC)    a. EF previously 15-20% with cath in 2014 showing no significant CAD b. EF 20-25% by echo in 06/2018  . CKD (chronic kidney disease), stage IV ([place])   . COPD (chronic obstructive pulmonary disease) ([place])   . Essential hypertension, benign   . HIV disease ([place]) 09/06/2016  . Hyperlipidemia   . NICM (nonischemic cardiomyopathy) ([place])   . Noncompliance with medication regimen   . NSVT (nonsustained ventricular tachycardia) ([place])   . Numbness of right jaw    Had a stoke in 01/2013. Numbness is occasional, especially when trying to chew.  . OSA (obstructive sleep apnea)   . Productive cough 08/2013   With brown sputum   . S/P aortic valve replacement with bioprosthetic valve   . Stroke ([place]) 01/2013   weakness of right side from CVA  . Type 2 diabetes mellitus ([place])   . Type 2  diabetes mellitus with hypertension and end stage renal disease on dialysis ([place]) 07/31/2018  . Uses hearing aid 2014   recently received new hearing aids    Past Surgical History:  Procedure Laterality Date  . AV FISTULA PLACEMENT Left 07/20/2018   Procedure: INSERTION ARTERIOVENOUS GORTEX GRAFT  LEFT ARM;  Surgeon: Rosetta Posner, MD;  Location: [facility];  Service: Vascular;  Laterality: Left;  . BIOPSY N/A 04/10/2014   Procedure: GASTRIC BIOPSY;  Surgeon: Daneil Dolin, MD;  Location: [facility];  Service: Endoscopy;  Laterality: N/A;  . BIOPSY  10/12/2015   Procedure: BIOPSY;  Surgeon: Daneil Dolin, MD;  [facility];  Service: Endoscopy;;  stomach bx's  . CARDIAC DEFIBRILLATOR PLACEMENT  11/12/12   [employer] Inogen MINI ICD implanted in [place] at [place] [facility] per Dr Nelly Laurence note  . COLONOSCOPY WITH PROPOFOL N/A 04/10/2014   RMR: Colonic Diverticulosis  . ESOPHAGOGASTRODUODENOSCOPY (EGD) WITH PROPOFOL N/A 04/10/2014   RMR: Mild erosive reflux esophagitis. Multiple antral polyps likely hyperplastic status post removal by hot snare cautery technique. Diffusely abnormal stomach status post gastric biopsy I suspect some of patients anemaia may be due to intermittent oozing from the stomach. It would be difficult and a risky proposition to attempt complete removal  of all of his gastric polyps.  . ESOPHAGOGASTRODUODENOSCOPY (EGD) WITH PROPOFOL N/A 10/12/2015   Procedure: ESOPHAGOGASTRODUODENOSCOPY (EGD) WITH PROPOFOL;  Surgeon: Daneil Dolin, MD;  [facility];  Service: Endoscopy;  Laterality: N/A;  [id number] - moved to 9:15  . INSERTION OF DIALYSIS CATHETER Right 07/20/2018   Procedure: EXCHANGE OF DIALYSIS CATHETER RIGHT INTERNAL JUGULAR;  Surgeon: Rosetta Posner, MD;  Location: [facility];  Service: Vascular;  Laterality: Right;  . IR FLUORO GUIDE CV LINE RIGHT  07/09/2018  . IR US GUIDE VASC ACCESS RIGHT  07/09/2018  .  POLYPECTOMY N/A 04/10/2014   Procedure: GASTRIC POLYPECTOMY;  Surgeon: Daneil Dolin, MD;  Location: [facility];  Service: Endoscopy;  Laterality: N/A;  . RIGHT HEART CATHETERIZATION N/A 02/06/2014   Procedure: RIGHT HEART CATH;  Surgeon: Larey Dresser, MD;  Location: [facility] CATH LAB;  Service: Cardiovascular;  Laterality: N/A;    Social History   Socioeconomic History  . Marital status: Single    Spouse name: Not on file  . Number of children: Not on file  . Years of education: Not on file  . Highest education level: Not on file  Occupational History  . Occupation: disabled  Social Needs  . Financial resource strain: Not hard at all  . Food insecurity    Worry: Never true    Inability: Never true  . Transportation needs    Medical: No    Non-medical: No  Tobacco Use  . Smoking status: Former Smoker    Types: Cigarettes    Quit date: 11/05/1993    Years since quitting: 25.4  . Smokeless tobacco: Never Used  Substance and Sexual Activity  . Alcohol use: Not Currently    Comment: former heavy ETOH, none recently  . Drug use: No    Comment: prior history of cocaine use, smoking   . Sexual activity: Yes    Birth control/protection: None  Lifestyle  . Physical activity    Days per week: 0 days    Minutes per session: 0 min  . Stress: Not at all  Relationships  . Social Herbalist on phone: Never    Gets together: Once a week    Attends religious service: Never    Active member of club or organization: No    Attends meetings of clubs or organizations: Never    Relationship status: Never married  . Intimate partner violence    Fear of current or ex partner: No    Emotionally abused: No    Physically abused: No    Forced sexual activity: No  Other Topics Concern  . Not on file  Social History Narrative    Has h/o polysubstance abuse.  Baseline    2019- resident of [facility]. Unable to ambulate unassisted.            Family History  Problem Relation  Age of Onset  . Kidney disease Mother   . Kidney disease Sister   . Colon cancer Neg Hx       VITAL SIGNS BP (!) 150/70   Pulse 62   Temp (!) 97.2 F (36.2 C) (Oral)   Resp 18   Ht '5\' 3"'  (1.6 m)   Wt 140 lb (63.5 kg)   SpO2 94%   BMI 24.80 kg/m   Outpatient Encounter Medications as of 04/03/2019  Medication Sig  . acetaminophen (TYLENOL) 325 MG tablet Take 2 tablets (650 mg total) by mouth every 4 (four)  hours as needed for mild pain (or temp > 37.5 C (99.5 F)).  Marland Kitchen albuterol (PROVENTIL) (2.5 MG/3ML) 0.083% nebulizer solution Take 3 mLs (2.5 mg total) by nebulization every 4 (four) hours as needed for wheezing or shortness of breath.  Marland Kitchen apixaban (ELIQUIS) 2.5 MG TABS tablet Take 2.5 mg by mouth 2 (two) times daily.  Marland Kitchen atorvastatin (LIPITOR) 80 MG tablet Take 80 mg by mouth daily.   Roseanne Kaufman [place]-Castor Oil (VENELEX) OINT Ointment; - ; topical  Special Instructions: Apply to sacrum qshift & prn. Every Shift Day, Evening, Night  . bictegravir-emtricitabine-tenofovir AF (BIKTARVY) 50-200-25 MG TABS tablet Take 1 tablet by mouth daily.  . calcium carbonate (TUMS EX) 750 MG chewable tablet Chew 1 tablet by mouth 2 (two) times daily.  . Cholecalciferol (VITAMIN D3) 2000 UNITS TABS Take 2,000 mg by mouth daily.  . coal tar (NEUTROGENA T-GEL) 0.5 % shampoo Apply to scalp on shower days (Twice a week) for dandruff and dry scalp Wed., and Sat.  . docusate sodium (COLACE) 100 MG capsule Take 100 mg by mouth daily.  . Fluticasone-Salmeterol (ADVAIR) 100-50 MCG/DOSE AEPB Inhale 1 puff into the lungs daily.  . metoprolol tartrate (LOPRESSOR) 100 MG tablet Take 100 mg by mouth 2 (two) times daily.  . midodrine (PROAMATINE) 10 MG tablet Take 10 mg by mouth. Send medication with resident to be given at center. Once a day on Tues.,Thurs., Sat.  . multivitamin (RENA-VIT) TABS tablet Take 1 tablet by mouth at bedtime.  . NON FORMULARY Diet Type:  Dysphagia 2 with thin liquids (NO Straws)  .  Nutritional Supplements (FEEDING SUPPLEMENT, NEPRO CARB STEADY,) LIQD Take 237 mLs by mouth daily.   Marland Kitchen omeprazole (PRILOSEC) 20 MG capsule Take 20 mg by mouth at bedtime.  . phenylephrine-shark liver oil-mineral oil-petrolatum (PREPARATION H) 0.25-3-14-71.9 % rectal ointment Place 1 application rectally 3 (three) times daily.  Marland Kitchen senna-docusate (SENEXON-S) 8.6-50 MG tablet Take 1 tablet by mouth daily.  Marland Kitchen UNABLE TO FIND FLUID RESTRICTION 1200 CC/ 24hrs. Breakfast (12oz) 360 ml lunch (8oz) 240 ml Dinner (12oz) 360 ml Every Shift Day, Evening, Night  . ZINC OXIDE EX Apply Zinc oxide to bilateral buttocks area for irritation. Every Shift Day, Day, Day   No facility-administered encounter medications on file as of 04/03/2019.      SIGNIFICANT DIAGNOSTIC EXAMS  PREVIOUS   07-07-18: US renal: Medical renal disease changes of both kidneys. Small renal cysts in both kidneys. No evidence of hydronephrosis.  07-11-18: ct of head:  1. Stable head CT, with no new acute intracranial abnormality. 2. In band cerebral atrophy with chronic small vessel ischemic disease with multiple chronic ischemic infarcts as above. 3. Acute pan sinusitis, worsened from previous.  05-12-19 ct of abdomen and pelvis: Diverticulosis without diverticulitis. Bilateral renal cysts. Mild bibasilar atelectasis.  03-02-19: ct of head:  1. No acute cortically based infarct or acute intracranial hemorrhage identified. 2. Stable non contrast CT appearance of advanced chronic ischemic disease since February. 3.  ASPECTS 10 (chronic encephalomalacia).  03-02-19: ct angio of head and neck CTA head: 1. No intracranial large vessel occlusion. 2. Moderate focal stenosis within the P2 left PCA. 3. Mild focal stenosis within the right PCA at the P1-P2 junction. 4. Additional intracranial atherosclerotic disease without significant stenosis as described. CTA neck: 1. Atherosclerotic disease within the imaged aortic arch and  major branch vessels of the neck as detailed and most notably as follows. 2. Approximately 60-70% stenosis of the proximal left ICA. 3.  Less than 50% stenosis of the proximal right ICA. 4. Mild/moderate ostial stenosis of the dominant left vertebral artery. The bilateral vertebral arteries are otherwise patent within the neck without significant stenosis. 5. Anasarca. Partially visualized bilateral pleural effusions. The right pleural effusion may be loculated. 6. Sequela of renal osteodystrophy.  03-03-19" 2-d echo 1. Left ventricular ejection fraction, by visual estimation, is 15-20%. The left ventricle has severely decreased function. Moderately increased left ventricular size. There is mildly increased left ventricular hypertrophy. There is global hypokinesis  with some regional variation.  2. Left ventricular diastolic Doppler parameters are consistent with impaired relaxation pattern of LV diastolic filling.  3. Global right ventricle has mildly reduced systolic function.The right ventricular size is normal. No increase in right ventricular wall thickness.  4. Left atrial size was mildly dilated.  5. Right atrial size was moderately dilated.  6. The mitral valve is grossly normal. Mild mitral valve regurgitation.  7. The tricuspid valve is grossly normal. Tricuspid valve regurgitation moderate.  8. The pulmonic valve was grossly normal. Pulmonic valve regurgitation is trivial by color flow Doppler.  9. Mildly elevated pulmonary artery systolic pressure. 10. A ICD wire is visualized. 11. The inferior vena cava is dilated in size with <50% respiratory variability, suggesting right atrial pressure of 15 mmHg. 12. Bioprosthesis in aortic position (unknown size and type). No perivalvular leak. Acoustic shadowing noted in association with leaflet motion - unable to completely exclude vegetation based on images. If clinically indicated and feasible, could  consider TEE. 13. Aortic valve  regurgitation was not visualized by color flow Doppler. 14. Aortic valve mean gradient measures 10.7 mmHg. 15. The tricuspid regurgitant velocity is 2.17 m/s, and with an assumed right atrial pressure of 15 mmHg, the estimated right ventricular systolic pressure is mildly elevated at 33.8 mmHg.   03-05-19: EEG: This recording of awake and drowsy states shows mild global slowing indicating a mild global encephalopathy. However, no epileptiform activities are documented.   03-05-19; MRI of brain: No acute infarct Atrophy with extensive chronic ischemic changes.  03-07-19: ct of abdomen and pelvis:  1. Interval large amount of free peritoneal fluid in the abdomen and pelvis. 2. Interval diffuse subcutaneous edema. 3. Small to moderate-sized right pleural effusion and small left pleural effusion. 4. Mild increase in prominence of the interstitial markings, compatible with interstitial pulmonary edema. 5. Stable possible early changes of cirrhosis of the liver. 6. Colonic diverticulosis. 7. Bilateral L5 spondylolysis with minimal spondylolisthesis at the L5-S1 level. 8. 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 9. Stable cardiomegaly and dense calcific coronary artery and aortic atherosclerosis.  03/15/2019: paracentesis: Successful ultrasound-guided paracentesis yielding 3.9 liters of peritoneal fluid.  PREVIOUS   LABS REVIEWED PREVIOUS:   05-22-18: hgb a1c 6.5 chol 100 ldl 43; trig 170; hdl 23 07-04-18: wbc  3.7; hgb 14.0; hct 45.6; mcv 91.8; plt 124; glucose 100; bun 45; creat 2.44; k+ 4.7; na++ 139 BNP 679.0  07-06-18: wbc 1.9; hgb 15.2; hct 48.8; mcv 91.2 plt 107; glucose 136; bun 43; creat 3.01; k+ 5.9; na++ 136; ca 8.8 ast 70 alt 46; albumin 3.0 BNP 554.5   blood culture: no growth /e-coli  07-08-18: wbc 13.8; hgb 13.1; hct 41.1; mcv 93.2 plt 73 glucose 108 ;bun 74; creat 5.32; k+ 5.5; na++ 139; ca 7.5; ast 80 alt 117; albumin 2.3  07-09-18; tsh 3.098  07-17-18: wbc 3.1; hgb 1.8; hct 37.3; mcv 89.9 plt 103 glucose 114; bun 95;  creat 6.00; k+ 3.5; na++ 139; ca 6.4 mag 2.1 07-27-18: INR 2.3: coumadin 5 mg daily   07-30-18: wbc  6.8; hgb 10.7; hct 33.6; mcv 89.6; plt 94 glucose 90; bun 93; creat 7.47; k+ 4.2; na++ 135; ca 7.1 08-02-18: INR 1.9   08-15-18: HIV quant: <20 CD4: 360  08-16-18: INR 3.2  08-23-18: INR  1.7 09-04-18: hgb 7.4 hct 23.6  glucose 90; bun 16; create 2.64; k+ 2.8; na++ 138; ca 8.3 phos 3.0; albumin 2.9 iron 52; tibc 218; PTH 124 vit 69.3  09-06-18: INR 2.5 guaiac neg  09-07-18: glucose 99; bun 22; creat 3.24; k+ 3.4;na++ 137; ca 8.4  09-11-18 hgb 10.3; hct 33.9 glucose 83; bun 18; creat 2.48; k+ 3.8; na++ 140; ca 8.9 iron 43; tibc 225 PTH 115 vit D 44.6 09-20-18: INR 2.1 09-22-18: guaiac: neg  10-13-18: INR 2.4 10-19-18: INR 2.1 11-02-18: INR 2.2  11-16-18: INR 3.8 11-19-18: INR 2.2 11-26-18: INR 1.6  12-05-18: hgb a1c 6.1 12-10-18: INR 1.9 12-18-18: INR 2.2  01-01-19: INR 2.3  01-10-19: wbc 2.3; hgb 12.1; hct 37.5; mcv 96.9; plt 101; INR 3.0 01-15-19: wbc 2.0; hgb 12.8; hct 39.5; mcv 96.1; plt 100; glucose 126; bun 40; creat 5.45; k+ 3.8; na++ 136; ca 8.8; liver normal albumin 3.2; PSA 0.98; INR 3.9 01-22-19; INR 3.1  01-29-19: INR 2.7 02-12-19: INR 4.6  02-15-19: INR 2.9  02-26-19: INR 1.9  03-02-19: wbc 2.4; hgb 12.4; hct 36.3; mcv 92.6; plt 79; glucose 106; bun 19; creat 2.53; k+ 3.1; na++ 136; ca 8.3; ast 47 alt 45; alk phos 161; total bili 1.8; albumin 2.9; INR 2.0 03-03-19: chol 113; ldl 68; trig 71; hdl 31 03-04-19: wbc 2.7; hgb 12.7; hct 38.2; mcv 95.0 plt 79; glucose 80; bun 30; creat 3.96; k+ 4.0; na++ 138; ca 8.8; mag 2.1; phos 3.9 03/01/2019: wbc 2.7; hgb 11.5; hct 32.9; mcv 90.9 ;plt 68; glucose 120; bun 23; creat 3.41; k+ 3.4; na++ 134; ca 8.8  03-12-19: glucose 100 bun 37; creat 5.01 ;k+ 3.8; na++ 133 ca 8.6; total bili 1.3 albumin 2.9 INR: 2.2 CD4 t cell: 135; HIV RNA quant: <20; RPR: neg   TODAY 03-27-19: INR 6.0  03-29-19: INR 6.8  04-01-19: INR 6.8 04-02-19; INR 5.1; albumin 2.4  04-03-19 INR 3.1    Review of Systems  Unable to perform ROS: Dementia (unable to participate )   Physical Exam Constitutional:      General: He is not in acute distress.    Appearance: He is well-developed. He is not diaphoretic.  Neck:     Musculoskeletal: Neck supple.     Thyroid: No thyromegaly.  Cardiovascular:     Rate and Rhythm: Normal rate and regular rhythm.     Pulses: Normal pulses.     Heart sounds: Murmur present.     Comments: 2/6 ICD present  Pulmonary:     Effort: Pulmonary effort is normal. No respiratory distress.     Breath sounds: Normal breath sounds.  Abdominal:     General: Bowel sounds are normal. There is no distension.     Palpations: Abdomen is soft.     Tenderness: There is no abdominal tenderness.  Musculoskeletal:     Right lower leg: No edema.     Left lower leg: No edema.     Comments: Is able to move all extremities   Lymphadenopathy:     Cervical: No cervical adenopathy.  Skin:    General: Skin is warm and dry.  Comments: Left upper arm a/v fistula: + thrill + bruit       Neurological:     Mental Status: He is alert. Mental status is at baseline.  Psychiatric:        Mood and Affect: Mood normal.      ASSESSMENT/ PLAN:  TODAY;   1. Failure to thrive in adult 2. Chronic combined systolic and diastolic heart failure 3. Vascular dementia without behavioral disturbance 4. 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 Chronic kidney disease with end stage renal stage on dialysis due to type 2 diabetes mellitus  Will continue dialysis while family makes further decisions Will continue to hold eliquis at this time due to INR of 3.1 Will repeat INR on 04-05-19.  Will continue DNR   Time spent with patient and family: 40 minutes: discussed code status; overall health status; prognosis and continued dialysis has verbalized understanding.   Will  stop dialysis and will make him comfort care only will stop the following medications: advair; lipitor; biktarvy; eliquis; lopressor; midodrine; mvi; calcium; fluid restriction; dialysis.   MD is aware of resident's narcotic use and is in agreement with current plan of care. We will attempt to wean resident as appropriate.  Ok Edwards NP [facility]  Contact [phone] Monday through Friday 8am- 5pm  After hours call [phone]

## 2019-04-30 DEATH — deceased

## 2020-03-19 ENCOUNTER — Ambulatory Visit: Payer: Medicare HMO | Admitting: Internal Medicine

## 2020-10-09 IMAGING — RF SWALLOWING FUNCTION
12 of 21 series · 12 of 24 positions shown · non-contrast
Comparison: None

CLINICAL DATA: Dysphagia, history HIV, stroke, chronic systolic
heart failure, COPD, hypertension, type II diabetes mellitus, has
been on honey thick liquids

EXAM:
MODIFIED BARIUM SWALLOW
TECHNIQUE: Different consistencies of barium were administered orally to the
patient by the Speech Pathologist. Imaging of the pharynx was
performed in the lateral projection. The radiologist was present in
the fluoroscopy room for this study, providing personal supervision.
FLUOROSCOPY TIME:  Fluoroscopy Time:  3 minutes 6 seconds
Radiation Exposure Index (if provided by the fluoroscopic device):
22.4 mGy
Number of Acquired Spot Images: multiple fluoroscopic screen
captures

[Series 2: tsp ntl · 1 of 164 frames shown (1 of 2)]
[frame 25/164]
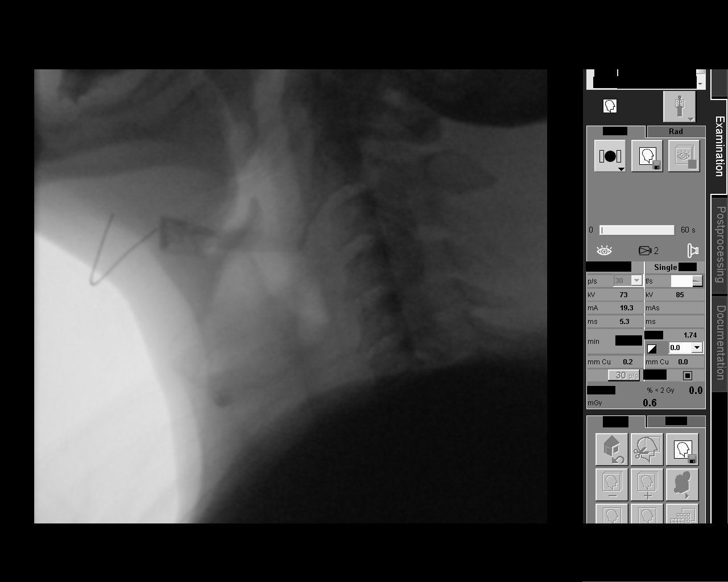

[Series 3: tsp ntl · 1 of 410 frames shown (2 of 2)]
[frame 410/410]
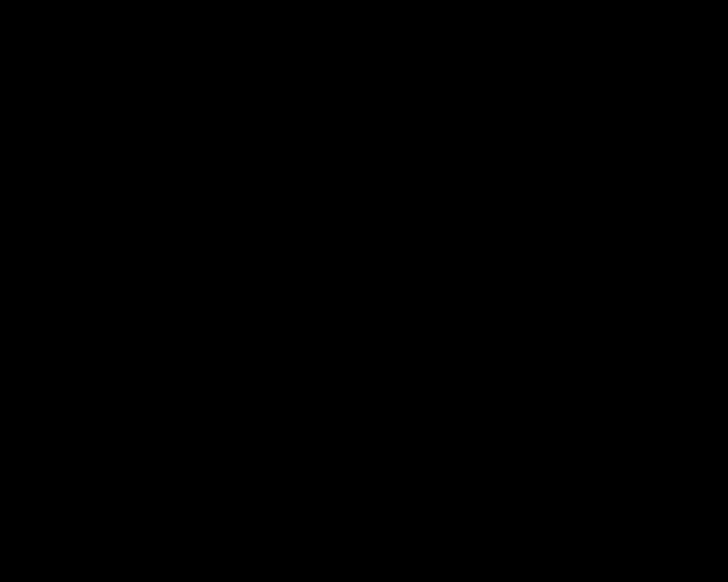

[Series 5: cup ntl · 1 of 231 frames shown]
[frame 116/231]
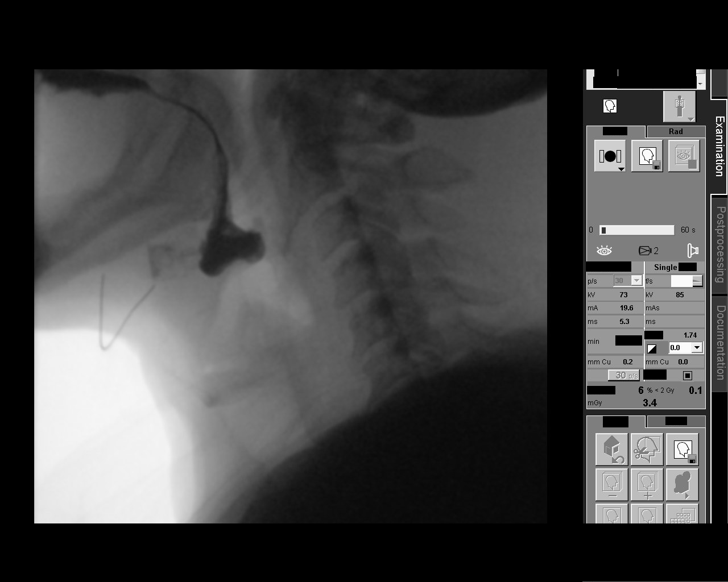

[Series 7: cup thin · 1 of 623 frames shown (1 of 2)]
[frame 312/623]
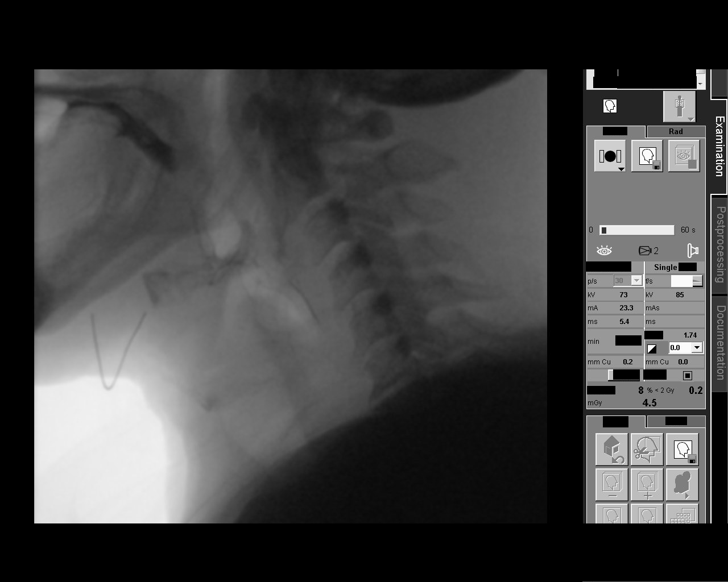

[Series 9: puree 2 · 1 of 658 frames shown]
[frame 99/658]
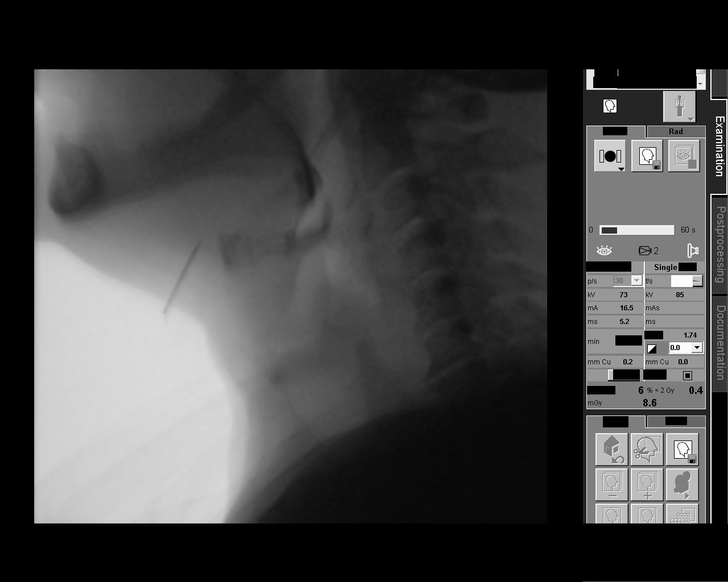

[Series 11: still mech soft · 1 of 285 frames shown (1 of 2)]
[frame 43/285]
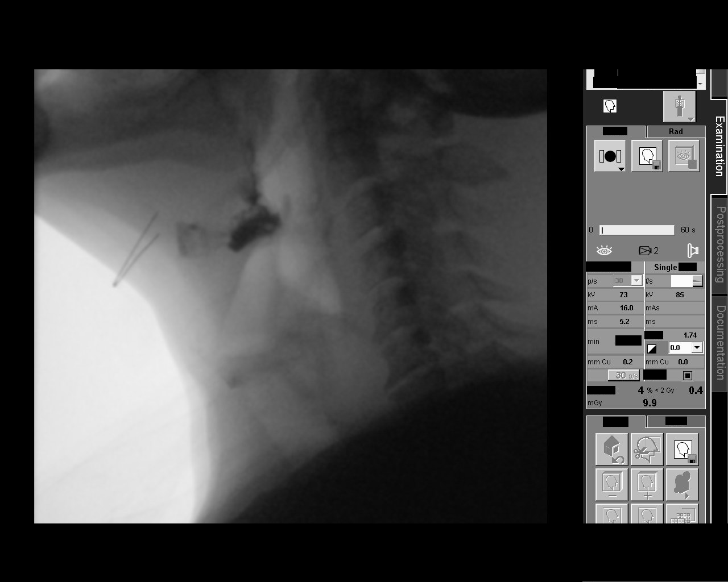

[Series 12: still mech soft · 1 of 221 frames shown (2 of 2)]
[frame 219/221]
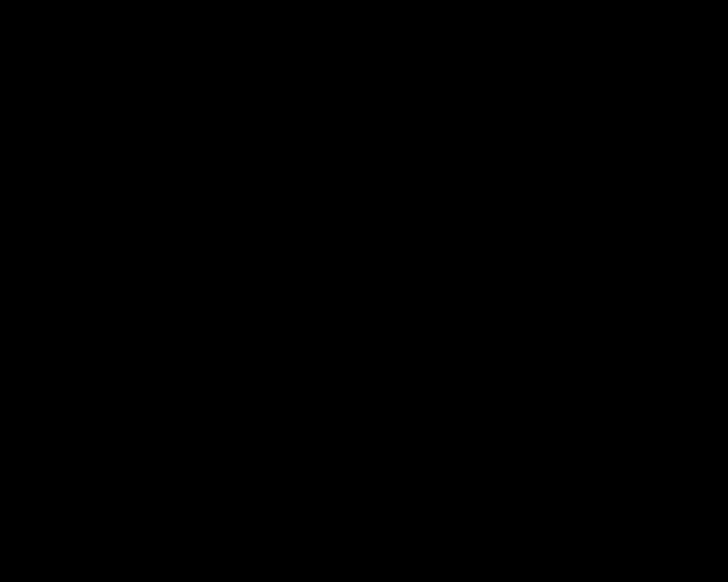

[Series 14: cup thin · 1 of 218 frames shown (2 of 2)]
[frame 110/218]
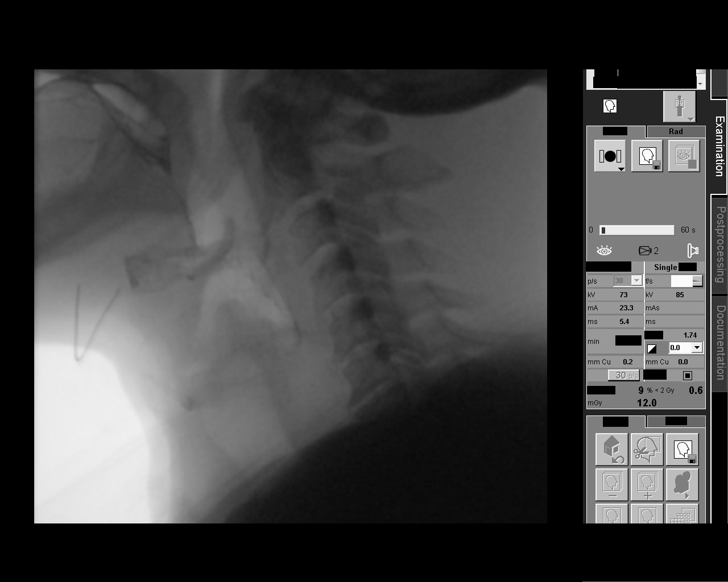

[Series 16: sweep · 1 of 275 frames shown]
[frame 138/275]
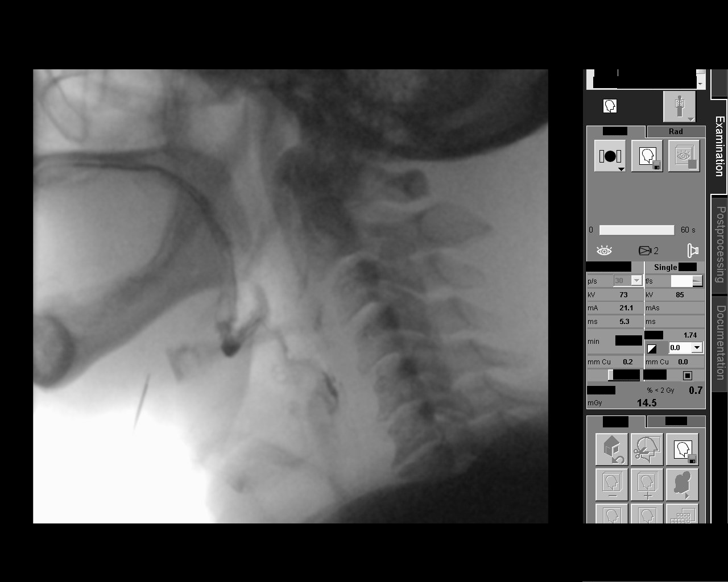

[Series 18: straw thin, aspiration · 1 of 364 frames shown]
[frame 183/364]
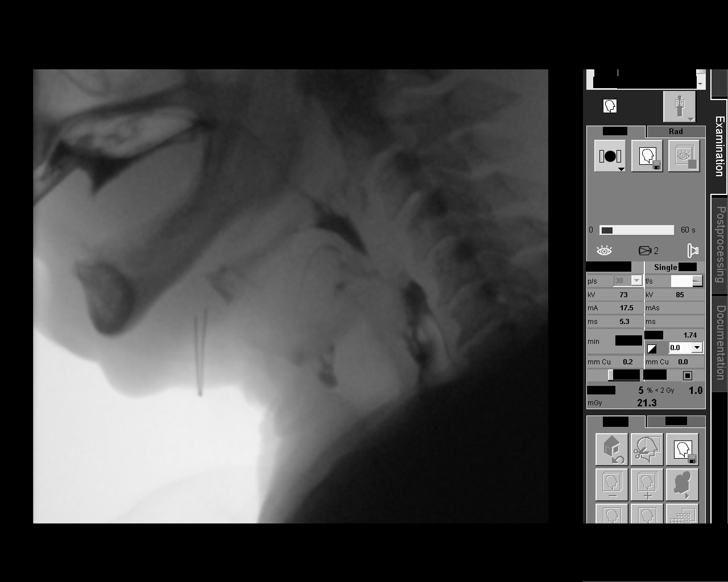

[Series 20: spontaneous cough · 1 of 58 frames shown]
[frame 9/58]
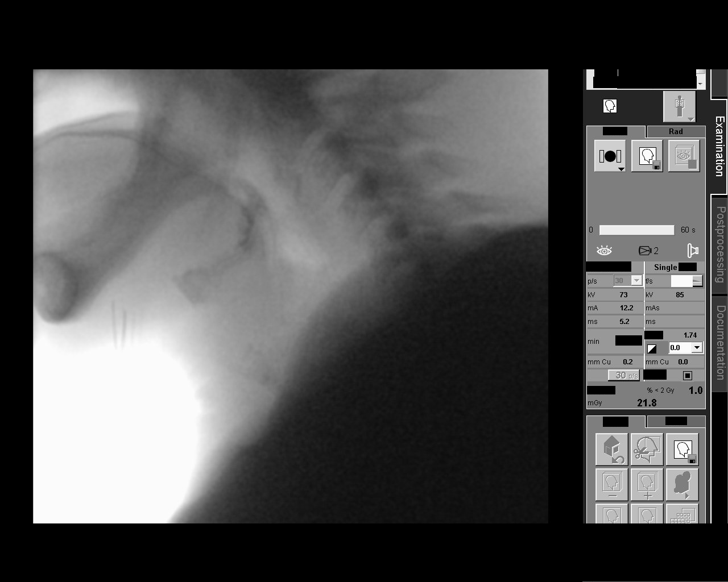

[Series 21: run · 1 of 86 frames shown]
[frame 74/86]
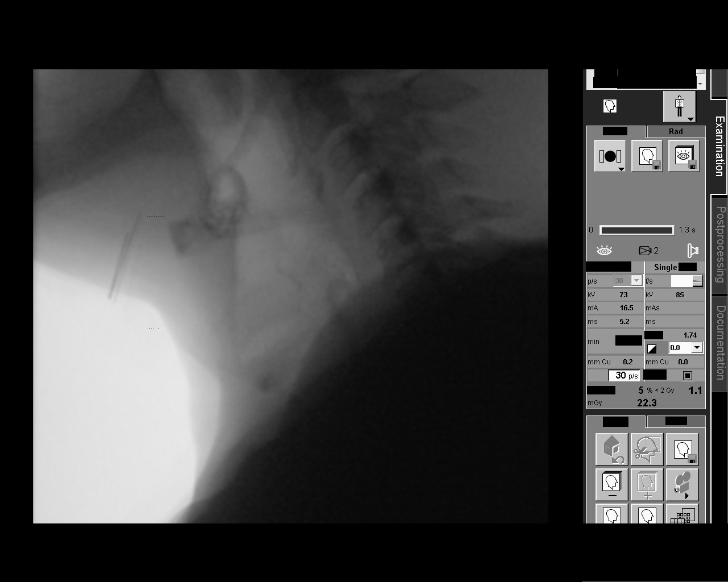

[12 of 24 positions shown; findings below may reference images not displayed]

FINDINGS: With thin barium, delays in initiation of swallowing identified with
spillover of contrast to the vallecular and piriform sinuses.
Aspiration was only seen on swallows by straw. Delayed cough reflex
was noted.

With nectar consistency, spillover to the vallecular and piriform
sinuses was seen with delayed initiation. No laryngeal penetration
or aspiration. Minimal vallecular residuals.

With applesauce, spillover to the vallecular is seen with delayed
initiation. No laryngeal penetration or aspiration. Similar findings
with cracker.

Patient had flash laryngeal penetration when swallowing a 12.5 mm
diameter barium tablet with thin barium. Pill passed to the stomach
without esophageal obstruction.
IMPRESSION: Swallowing dysfunction as above.

Please refer to the Speech Pathologists report for complete details
and recommendations.
